# Patient Record
Sex: Female | Born: 1940
Health system: Southern US, Community
[De-identification: ages and names within clinical notes are randomized; demographics above are authoritative.]

## PROBLEM LIST (undated history)

## (undated) DIAGNOSIS — H269 Unspecified cataract: Secondary | ICD-10-CM

## (undated) DIAGNOSIS — I890 Lymphedema, not elsewhere classified: Secondary | ICD-10-CM

## (undated) DIAGNOSIS — J189 Pneumonia, unspecified organism: Secondary | ICD-10-CM

## (undated) DIAGNOSIS — C50919 Malignant neoplasm of unspecified site of unspecified female breast: Secondary | ICD-10-CM

## (undated) DIAGNOSIS — N189 Chronic kidney disease, unspecified: Secondary | ICD-10-CM

## (undated) DIAGNOSIS — Z8701 Personal history of pneumonia (recurrent): Secondary | ICD-10-CM

## (undated) DIAGNOSIS — E785 Hyperlipidemia, unspecified: Secondary | ICD-10-CM

## (undated) DIAGNOSIS — T8859XA Other complications of anesthesia, initial encounter: Secondary | ICD-10-CM

## (undated) DIAGNOSIS — T4145XA Adverse effect of unspecified anesthetic, initial encounter: Secondary | ICD-10-CM

## (undated) DIAGNOSIS — F419 Anxiety disorder, unspecified: Secondary | ICD-10-CM

## (undated) DIAGNOSIS — C229 Malignant neoplasm of liver, not specified as primary or secondary: Secondary | ICD-10-CM

## (undated) DIAGNOSIS — R011 Cardiac murmur, unspecified: Secondary | ICD-10-CM

## (undated) DIAGNOSIS — I5032 Chronic diastolic (congestive) heart failure: Secondary | ICD-10-CM

## (undated) DIAGNOSIS — K219 Gastro-esophageal reflux disease without esophagitis: Secondary | ICD-10-CM

## (undated) DIAGNOSIS — Z8042 Family history of malignant neoplasm of prostate: Secondary | ICD-10-CM

## (undated) DIAGNOSIS — F329 Major depressive disorder, single episode, unspecified: Secondary | ICD-10-CM

## (undated) DIAGNOSIS — M1990 Unspecified osteoarthritis, unspecified site: Secondary | ICD-10-CM

## (undated) DIAGNOSIS — I10 Essential (primary) hypertension: Secondary | ICD-10-CM

## (undated) DIAGNOSIS — N390 Urinary tract infection, site not specified: Secondary | ICD-10-CM

## (undated) DIAGNOSIS — Z803 Family history of malignant neoplasm of breast: Secondary | ICD-10-CM

## (undated) DIAGNOSIS — D649 Anemia, unspecified: Secondary | ICD-10-CM

## (undated) DIAGNOSIS — R55 Syncope and collapse: Secondary | ICD-10-CM

## (undated) DIAGNOSIS — K449 Diaphragmatic hernia without obstruction or gangrene: Secondary | ICD-10-CM

## (undated) DIAGNOSIS — F32A Depression, unspecified: Secondary | ICD-10-CM

## (undated) DIAGNOSIS — K2980 Duodenitis without bleeding: Secondary | ICD-10-CM

## (undated) DIAGNOSIS — G629 Polyneuropathy, unspecified: Secondary | ICD-10-CM

## (undated) HISTORY — PX: RECONSTRUCTION BREAST W/ TRAM FLAP: SUR1079

## (undated) HISTORY — DX: Malignant neoplasm of unspecified site of unspecified female breast: C50.919

## (undated) HISTORY — PX: EYE SURGERY: SHX253

## (undated) HISTORY — DX: Diaphragmatic hernia without obstruction or gangrene: K44.9

## (undated) HISTORY — DX: Essential (primary) hypertension: I10

## (undated) HISTORY — DX: Cardiac murmur, unspecified: R01.1

## (undated) HISTORY — PX: AXILLARY SURGERY: SHX892

## (undated) HISTORY — DX: Gastro-esophageal reflux disease without esophagitis: K21.9

## (undated) HISTORY — PX: TONSILLECTOMY: SUR1361

## (undated) HISTORY — DX: Personal history of pneumonia (recurrent): Z87.01

## (undated) HISTORY — PX: UPPER GI ENDOSCOPY: SHX6162

## (undated) HISTORY — DX: Urinary tract infection, site not specified: N39.0

## (undated) HISTORY — DX: Lymphedema, not elsewhere classified: I89.0

## (undated) HISTORY — DX: Unspecified cataract: H26.9

## (undated) HISTORY — DX: Hyperlipidemia, unspecified: E78.5

## (undated) HISTORY — DX: Unspecified osteoarthritis, unspecified site: M19.90

## (undated) HISTORY — DX: Duodenitis without bleeding: K29.80

## (undated) HISTORY — DX: Polyneuropathy, unspecified: G62.9

## (undated) HISTORY — DX: Syncope and collapse: R55

## (undated) HISTORY — DX: Anemia, unspecified: D64.9

---
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
## 1990-03-16 DIAGNOSIS — C50919 Malignant neoplasm of unspecified site of unspecified female breast: Secondary | ICD-10-CM

## 1990-03-16 HISTORY — PX: MASTECTOMY: SHX3

## 1990-03-16 HISTORY — DX: Malignant neoplasm of unspecified site of unspecified female breast: C50.919

## 1998-01-23 ENCOUNTER — Ambulatory Visit (HOSPITAL_COMMUNITY): Admission: RE | Admit: 1998-01-23 | Discharge: 1998-01-23 | Payer: Self-pay | Admitting: Family Medicine

## 2000-04-23 ENCOUNTER — Encounter: Admission: RE | Admit: 2000-04-23 | Discharge: 2000-04-23 | Payer: Self-pay | Admitting: Orthopedic Surgery

## 2000-04-23 ENCOUNTER — Encounter: Payer: Self-pay | Admitting: Orthopedic Surgery

## 2001-09-28 ENCOUNTER — Other Ambulatory Visit: Admission: RE | Admit: 2001-09-28 | Discharge: 2001-09-28 | Payer: Self-pay | Admitting: Obstetrics & Gynecology

## 2002-01-18 ENCOUNTER — Encounter: Payer: Self-pay | Admitting: Gastroenterology

## 2002-01-18 DIAGNOSIS — K2980 Duodenitis without bleeding: Secondary | ICD-10-CM

## 2002-01-18 HISTORY — DX: Duodenitis without bleeding: K29.80

## 2002-10-24 ENCOUNTER — Other Ambulatory Visit: Admission: RE | Admit: 2002-10-24 | Discharge: 2002-10-24 | Payer: Self-pay | Admitting: Obstetrics & Gynecology

## 2004-06-19 ENCOUNTER — Encounter: Admission: RE | Admit: 2004-06-19 | Discharge: 2004-06-19 | Payer: Self-pay | Admitting: Cardiovascular Disease

## 2004-09-09 ENCOUNTER — Encounter (INDEPENDENT_AMBULATORY_CARE_PROVIDER_SITE_OTHER): Payer: Self-pay | Admitting: *Deleted

## 2004-09-09 ENCOUNTER — Ambulatory Visit (HOSPITAL_COMMUNITY): Admission: RE | Admit: 2004-09-09 | Discharge: 2004-09-09 | Payer: Self-pay | Admitting: General Surgery

## 2004-09-09 ENCOUNTER — Ambulatory Visit (HOSPITAL_BASED_OUTPATIENT_CLINIC_OR_DEPARTMENT_OTHER): Admission: RE | Admit: 2004-09-09 | Discharge: 2004-09-09 | Payer: Self-pay | Admitting: General Surgery

## 2008-07-04 DIAGNOSIS — K219 Gastro-esophageal reflux disease without esophagitis: Secondary | ICD-10-CM | POA: Insufficient documentation

## 2008-07-04 DIAGNOSIS — K449 Diaphragmatic hernia without obstruction or gangrene: Secondary | ICD-10-CM | POA: Insufficient documentation

## 2008-07-04 DIAGNOSIS — K2980 Duodenitis without bleeding: Secondary | ICD-10-CM | POA: Insufficient documentation

## 2008-07-04 DIAGNOSIS — M129 Arthropathy, unspecified: Secondary | ICD-10-CM | POA: Insufficient documentation

## 2008-07-04 DIAGNOSIS — E7800 Pure hypercholesterolemia, unspecified: Secondary | ICD-10-CM | POA: Insufficient documentation

## 2008-07-04 DIAGNOSIS — I10 Essential (primary) hypertension: Secondary | ICD-10-CM | POA: Insufficient documentation

## 2008-07-04 DIAGNOSIS — C50919 Malignant neoplasm of unspecified site of unspecified female breast: Secondary | ICD-10-CM | POA: Insufficient documentation

## 2008-07-05 ENCOUNTER — Ambulatory Visit: Payer: Self-pay | Admitting: Gastroenterology

## 2008-07-05 DIAGNOSIS — E1129 Type 2 diabetes mellitus with other diabetic kidney complication: Secondary | ICD-10-CM | POA: Insufficient documentation

## 2008-07-05 DIAGNOSIS — R21 Rash and other nonspecific skin eruption: Secondary | ICD-10-CM | POA: Insufficient documentation

## 2008-07-05 LAB — CONVERTED CEMR LAB: Tissue Transglutaminase Ab, IgA: 0.1 units (ref <7)

## 2008-08-08 ENCOUNTER — Ambulatory Visit: Payer: Self-pay | Admitting: Gastroenterology

## 2008-08-08 LAB — CONVERTED CEMR LAB: UREASE: NEGATIVE

## 2008-11-29 ENCOUNTER — Telehealth: Payer: Self-pay | Admitting: Gastroenterology

## 2009-01-26 ENCOUNTER — Emergency Department (HOSPITAL_COMMUNITY): Admission: EM | Admit: 2009-01-26 | Discharge: 2009-01-26 | Payer: Self-pay | Admitting: Emergency Medicine

## 2009-01-26 ENCOUNTER — Emergency Department (HOSPITAL_COMMUNITY): Admission: EM | Admit: 2009-01-26 | Discharge: 2009-01-26 | Payer: Self-pay | Admitting: Family Medicine

## 2009-06-11 HISTORY — PX: NM MYOCAR PERF WALL MOTION: HXRAD629

## 2009-12-24 HISTORY — PX: TRANSTHORACIC ECHOCARDIOGRAM: SHX275

## 2010-04-01 ENCOUNTER — Telehealth: Payer: Self-pay | Admitting: Gastroenterology

## 2010-04-03 ENCOUNTER — Encounter: Payer: Self-pay | Admitting: Gastroenterology

## 2010-04-17 NOTE — Progress Notes (Signed)
Summary: Medication approval  Phone Note Call from Patient Call back at Home Phone [phone]   Caller: Patient Call For: Dr. Sharlett Iles Reason for Call: Talk to Nurse Summary of Call: Pt needs [place] to call and get her Lansoprazole approved with [employer] cross, she changed to the high option plan Initial call taken by: Martinique Johnson,  April 01, 2010 9:01 AM  Follow-up for Phone Call        i recieved a fax from the drug store this morning and I advised patient I will get the prior auth done as soon as I can, it will be no later than tomorrow afternoon Since Dr. Sharlett Iles is in clinic today. But I will call her when I hear back from the insurance company Follow-up by: Bernita Buffy CMA Deborra Medina),  April 01, 2010 12:02 PM     Appended Document: Medication approval medication approved, I have faxed sheet with information on it back to the pharm so they can rerun her rx. Left a message on patients machine to call back if questions other wise she can call the pharm

## 2010-04-17 NOTE — Medication Information (Signed)
Summary: Prevaid approved/BCBS  Prevaid approved/BCBS   Imported By: Phillis Knack 04/09/2010 09:14:03  _____________________________________________________________________  External Attachment:    Type:   Image     Comment:   External Document

## 2010-06-12 ENCOUNTER — Other Ambulatory Visit: Payer: Self-pay | Admitting: Gastroenterology

## 2010-06-12 MED ORDER — LANSOPRAZOLE 30 MG PO CPDR: 30.0000 mg | DELAYED_RELEASE_CAPSULE | Freq: Every day | ORAL | Status: DC

## 2010-06-12 NOTE — Telephone Encounter (Signed)
rx printed and mailed to patients home address. Left a message on patients machine to call back, if she has questions

## 2010-06-18 LAB — POCT I-STAT, CHEM 8
BUN: 13 mg/dL (ref 6–23)
Calcium, Ion: 1.15 mmol/L (ref 1.12–1.32)
Chloride: 102 mEq/L (ref 96–112)
Creatinine, Ser: 1.1 mg/dL (ref 0.4–1.2)
Glucose, Bld: 152 mg/dL — ABNORMAL HIGH (ref 70–99)
HCT: 41 % (ref 36.0–46.0)
Hemoglobin: 13.9 g/dL (ref 12.0–15.0)
Potassium: 3.9 mEq/L (ref 3.5–5.1)
Sodium: 139 mEq/L (ref 135–145)
TCO2: 26 mmol/L (ref 0–100)

## 2010-06-18 LAB — CBC
HCT: 37.1 % (ref 36.0–46.0)
Hemoglobin: 12.6 g/dL (ref 12.0–15.0)
MCHC: 33.9 g/dL (ref 30.0–36.0)
MCV: 88.6 fL (ref 78.0–100.0)
Platelets: 170 10*3/uL (ref 150–400)
RBC: 4.19 MIL/uL (ref 3.87–5.11)
RDW: 15 % (ref 11.5–15.5)
WBC: 10.5 10*3/uL (ref 4.0–10.5)

## 2010-06-18 LAB — DIFFERENTIAL
Basophils Absolute: 0 10*3/uL (ref 0.0–0.1)
Basophils Relative: 0 % (ref 0–1)
Eosinophils Absolute: 0.2 10*3/uL (ref 0.0–0.7)
Eosinophils Relative: 2 % (ref 0–5)
Lymphocytes Relative: 9 % — ABNORMAL LOW (ref 12–46)
Lymphs Abs: 0.9 10*3/uL (ref 0.7–4.0)
Monocytes Absolute: 0.4 10*3/uL (ref 0.1–1.0)
Monocytes Relative: 4 % (ref 3–12)
Neutro Abs: 8.9 10*3/uL — ABNORMAL HIGH (ref 1.7–7.7)
Neutrophils Relative %: 86 % — ABNORMAL HIGH (ref 43–77)

## 2010-06-24 LAB — GLUCOSE, CAPILLARY
Glucose-Capillary: 90 mg/dL (ref 70–99)
Glucose-Capillary: 99 mg/dL (ref 70–99)

## 2010-08-01 NOTE — Op Note (Signed)
NAMEALINNA, BURKHOLDER                 ACCOUNT NO.:  [id number]   MEDICAL RECORD NO.:  [id number]          PATIENT TYPE:  AMB   LOCATION:  [place]                          FACILITY:  [facility]   PHYSICIAN:  Rudell Cobb. Annamaria Boots, M.D.   DATE OF BIRTH:  [birth year]   DATE OF PROCEDURE:  09/09/2004  DATE OF DISCHARGE:                                 OPERATIVE REPORT   PREOPERATIVE DIAGNOSIS:  Sebaceous cyst of the right axilla.   POSTOPERATIVE DIAGNOSIS:  Sebaceous cyst of the right axilla.   OPERATION PERFORMED:  Excision of sebaceous cyst of the right axilla.   SURGEON:  Rudell Cobb. Annamaria Boots, M.D.   ANESTHESIA:  Local.   INDICATIONS FOR PROCEDURE:  Ms. Nuxoll has had a small sebaceous cyst for  some time in the right axilla but recently it had gotten slightly larger and  was causing some pain and discomfort and for that reason was scheduled for  excision.   DESCRIPTION OF PROCEDURE:  She was placed on the operating table and the  right axilla prepped and draped in the usual fashion. I anesthetized it with  1% Xylocaine with Adrenalin and then excised the sebaceous cyst.  It was a  small defect measuring 1.5 x 0.5 cm.  It was closed with three 4-0 nylon  sutures.  Dressing applied.  The patient was then allowed to go home.       PRY/MEDQ  D:  09/09/2004  T:  09/09/2004  Job:  [id number]

## 2011-03-24 DIAGNOSIS — I10 Essential (primary) hypertension: Secondary | ICD-10-CM | POA: Diagnosis not present

## 2011-03-24 DIAGNOSIS — E785 Hyperlipidemia, unspecified: Secondary | ICD-10-CM | POA: Diagnosis not present

## 2011-03-24 DIAGNOSIS — M899 Disorder of bone, unspecified: Secondary | ICD-10-CM | POA: Diagnosis not present

## 2011-03-24 DIAGNOSIS — E119 Type 2 diabetes mellitus without complications: Secondary | ICD-10-CM | POA: Diagnosis not present

## 2011-03-24 DIAGNOSIS — M949 Disorder of cartilage, unspecified: Secondary | ICD-10-CM | POA: Diagnosis not present

## 2011-03-30 DIAGNOSIS — M899 Disorder of bone, unspecified: Secondary | ICD-10-CM | POA: Diagnosis not present

## 2011-03-30 DIAGNOSIS — I10 Essential (primary) hypertension: Secondary | ICD-10-CM | POA: Diagnosis not present

## 2011-03-30 DIAGNOSIS — E559 Vitamin D deficiency, unspecified: Secondary | ICD-10-CM | POA: Diagnosis not present

## 2011-03-30 DIAGNOSIS — E785 Hyperlipidemia, unspecified: Secondary | ICD-10-CM | POA: Diagnosis not present

## 2011-03-30 DIAGNOSIS — E119 Type 2 diabetes mellitus without complications: Secondary | ICD-10-CM | POA: Diagnosis not present

## 2011-03-30 DIAGNOSIS — M949 Disorder of cartilage, unspecified: Secondary | ICD-10-CM | POA: Diagnosis not present

## 2011-04-07 DIAGNOSIS — I10 Essential (primary) hypertension: Secondary | ICD-10-CM | POA: Diagnosis not present

## 2011-04-07 DIAGNOSIS — I701 Atherosclerosis of renal artery: Secondary | ICD-10-CM | POA: Diagnosis not present

## 2011-06-10 DIAGNOSIS — I10 Essential (primary) hypertension: Secondary | ICD-10-CM | POA: Diagnosis not present

## 2011-06-10 DIAGNOSIS — I739 Peripheral vascular disease, unspecified: Secondary | ICD-10-CM | POA: Diagnosis not present

## 2011-06-10 DIAGNOSIS — E782 Mixed hyperlipidemia: Secondary | ICD-10-CM | POA: Diagnosis not present

## 2011-06-15 ENCOUNTER — Ambulatory Visit (INDEPENDENT_AMBULATORY_CARE_PROVIDER_SITE_OTHER): Payer: Medicare Other | Admitting: Internal Medicine

## 2011-06-15 ENCOUNTER — Encounter: Payer: Self-pay | Admitting: Internal Medicine

## 2011-06-15 VITALS — BP 140/70 | HR 70 | Temp 99.0°F | Resp 20 | Ht 65.0 in | Wt 172.0 lb

## 2011-06-15 DIAGNOSIS — M129 Arthropathy, unspecified: Secondary | ICD-10-CM

## 2011-06-15 DIAGNOSIS — Z0000 Encounter for general adult medical examination without abnormal findings: Secondary | ICD-10-CM

## 2011-06-15 DIAGNOSIS — I10 Essential (primary) hypertension: Secondary | ICD-10-CM | POA: Diagnosis not present

## 2011-06-15 DIAGNOSIS — E119 Type 2 diabetes mellitus without complications: Secondary | ICD-10-CM | POA: Diagnosis not present

## 2011-06-15 DIAGNOSIS — E7800 Pure hypercholesterolemia, unspecified: Secondary | ICD-10-CM

## 2011-06-15 NOTE — Patient Instructions (Signed)
Please check your hemoglobin A1c every 3 months  Limit your sodium (Salt) intake    It is important that you exercise regularly, at least 20 minutes 3 to 4 times per week.  If you develop chest pain or shortness of breath seek  medical attention.  Take a calcium supplement, plus [phone] units of vitamin D  Return in one year for follow-up

## 2011-06-15 NOTE — Progress Notes (Signed)
Subjective:    Patient ID: Donna May, female    DOB: [birth year], 71 y.o.   MRN: [id number]  HPI   71 year old patient who is seen today to establish with our practice medical problems include diabetes hypertension and dyslipidemia. She has remote history of right breast cancer. Social consultants include cardiology Rollene Fare), GYN, endocrinology (Alzheimer) and ophthalmology Gershon Crane) She is doing quite well only complaint is intermittent shoulder and knee pain. She feels as she has had a colonoscopy about 4 or 5 years ago. She did have a mammogram last year and her next eye exam is due in June of this year.  Family history father died at 65 had a history of arthritis and senile dementia Mother died at age 56 her first MI at 47 status post CABG. She also had MS 2 brothers both with coronary artery disease one brother deceased also a history of diabetes.  Surgical history status post mastectomy in 1992 has had a rectovaginal fistula repaired in 1970 remote tonsillectomy in 1958  Retired from the [employer] school system   1. Risk factors, based on past  M,S,F history- cardiovascular risk factors include diabetes hypertension and dyslipidemia  2.  Physical activities: No exercise limitations but does have some bilateral knee pain 3.  Depression/mood: No history depression in her meds or her 4.  Hearing: No significant deficits  5.  ADL's: Independent in all aspects of daily living  6.  Fall risk: Low  7.  Home safety: No problems identified  8.  Height weight, and visual acuity;  9.  Counseling: Regular exercise restricted salt and calorie diet encouraged  10. Lab orders based on risk factors:  11. Referral : Followup with GYN cardiology endocrinology  12. Care plan: No change in regimen more exercise modest weight loss all encouraged 13. Cognitive assessment: Alert and oriented with normal affect no cognitive dysfunction    Review of Systems  Constitutional:  Negative.   HENT: Negative for hearing loss, congestion, sore throat, rhinorrhea, dental problem, sinus pressure and tinnitus.   Eyes: Negative for pain, discharge and visual disturbance.  Respiratory: Negative for cough and shortness of breath.   Cardiovascular: Negative for chest pain, palpitations and leg swelling.  Gastrointestinal: Negative for nausea, vomiting, abdominal pain, diarrhea, constipation, blood in stool and abdominal distention.  Genitourinary: Negative for dysuria, urgency, frequency, hematuria, flank pain, vaginal bleeding, vaginal discharge, difficulty urinating, vaginal pain and pelvic pain.  Musculoskeletal: Positive for arthralgias (knee and shoulder discomfort). Negative for joint swelling and gait problem.  Skin: Negative for rash.  Neurological: Negative for dizziness, syncope, speech difficulty, weakness, numbness and headaches.  Hematological: Negative for adenopathy.  Psychiatric/Behavioral: Negative for behavioral problems, dysphoric mood and agitation. The patient is not nervous/anxious.        Objective:   Physical Exam  Constitutional: She is oriented to person, place, and time. She appears well-developed and well-nourished.  HENT:  Head: Normocephalic.  Right Ear: External ear normal.  Left Ear: External ear normal.  Mouth/Throat: Oropharynx is clear and moist.  Eyes: Conjunctivae and EOM are normal. Pupils are equal, round, and reactive to light.  Neck: Normal range of motion. Neck supple. No thyromegaly present.  Cardiovascular: Normal rate, regular rhythm and intact distal pulses.   Murmur heard.      Grade 1- 2/6 systolic ejection murmur  Dorsalis pedis pulses are full posterior tibial pulses not easily palpable  Pulmonary/Chest: Effort normal and breath sounds normal.  Abdominal: Soft. Bowel sounds are normal. She  exhibits no mass. There is no tenderness.  Musculoskeletal: Normal range of motion.  Lymphadenopathy:    She has no cervical  adenopathy.  Neurological: She is alert and oriented to person, place, and time.       Intact to vibratory sensation and monofilament testing  Skin: Skin is warm and dry. No rash noted.  Psychiatric: She has a normal mood and affect. Her behavior is normal.          Assessment & Plan:   Preventive health examination Diabetes Hypertension Dyslipidemia  In view of her multiple consultants will reassess in one year

## 2011-06-16 ENCOUNTER — Encounter: Payer: Self-pay | Admitting: Internal Medicine

## 2011-07-20 DIAGNOSIS — E559 Vitamin D deficiency, unspecified: Secondary | ICD-10-CM | POA: Diagnosis not present

## 2011-07-20 DIAGNOSIS — E785 Hyperlipidemia, unspecified: Secondary | ICD-10-CM | POA: Diagnosis not present

## 2011-07-20 DIAGNOSIS — E119 Type 2 diabetes mellitus without complications: Secondary | ICD-10-CM | POA: Diagnosis not present

## 2011-07-22 DIAGNOSIS — I10 Essential (primary) hypertension: Secondary | ICD-10-CM | POA: Diagnosis not present

## 2011-07-22 DIAGNOSIS — E559 Vitamin D deficiency, unspecified: Secondary | ICD-10-CM | POA: Diagnosis not present

## 2011-07-22 DIAGNOSIS — E119 Type 2 diabetes mellitus without complications: Secondary | ICD-10-CM | POA: Diagnosis not present

## 2011-07-22 DIAGNOSIS — M899 Disorder of bone, unspecified: Secondary | ICD-10-CM | POA: Diagnosis not present

## 2011-07-22 DIAGNOSIS — E785 Hyperlipidemia, unspecified: Secondary | ICD-10-CM | POA: Diagnosis not present

## 2011-07-22 DIAGNOSIS — M949 Disorder of cartilage, unspecified: Secondary | ICD-10-CM | POA: Diagnosis not present

## 2011-09-03 ENCOUNTER — Telehealth: Payer: Self-pay | Admitting: Gastroenterology

## 2011-09-04 ENCOUNTER — Other Ambulatory Visit: Payer: Self-pay | Admitting: Gastroenterology

## 2011-09-04 MED ORDER — LANSOPRAZOLE 30 MG PO CPDR: 30.0000 mg | DELAYED_RELEASE_CAPSULE | Freq: Every day | ORAL | Status: DC

## 2011-09-04 NOTE — Telephone Encounter (Signed)
Pt made appointment so Rx sent to [facility] as she requested.

## 2011-09-04 NOTE — Telephone Encounter (Signed)
Pt requesting Prevacid refill, LM that she needs appointment to see Dr. Sharlett Iles before any more refills, last seen 07/05/2008.

## 2011-09-25 ENCOUNTER — Other Ambulatory Visit (INDEPENDENT_AMBULATORY_CARE_PROVIDER_SITE_OTHER): Payer: Medicare Other

## 2011-09-25 ENCOUNTER — Ambulatory Visit (INDEPENDENT_AMBULATORY_CARE_PROVIDER_SITE_OTHER): Payer: Medicare Other | Admitting: Gastroenterology

## 2011-09-25 ENCOUNTER — Encounter: Payer: Self-pay | Admitting: Gastroenterology

## 2011-09-25 VITALS — BP 120/60 | HR 60 | Ht 65.5 in | Wt 162.1 lb

## 2011-09-25 DIAGNOSIS — R7989 Other specified abnormal findings of blood chemistry: Secondary | ICD-10-CM | POA: Diagnosis not present

## 2011-09-25 DIAGNOSIS — K219 Gastro-esophageal reflux disease without esophagitis: Secondary | ICD-10-CM | POA: Diagnosis not present

## 2011-09-25 DIAGNOSIS — R6889 Other general symptoms and signs: Secondary | ICD-10-CM

## 2011-09-25 DIAGNOSIS — E539 Vitamin B deficiency, unspecified: Secondary | ICD-10-CM | POA: Diagnosis not present

## 2011-09-25 DIAGNOSIS — K5900 Constipation, unspecified: Secondary | ICD-10-CM

## 2011-09-25 LAB — FOLATE: Folate: 22.8 ng/mL (ref >5.9)

## 2011-09-25 LAB — VITAMIN B12: Vitamin B-12: 1199 pg/mL — ABNORMAL HIGH (ref 211–911)

## 2011-09-25 LAB — FERRITIN: Ferritin: 33.6 ng/mL (ref 10.0–291.0)

## 2011-09-25 LAB — IBC PANEL: Iron: 68 ug/dL (ref 42–145)

## 2011-09-25 MED ORDER — LANSOPRAZOLE 30 MG PO CPDR: DELAYED_RELEASE_CAPSULE | ORAL | Status: DC

## 2011-09-25 NOTE — Patient Instructions (Addendum)
Your physician has requested that you go to the basement for the following lab work before leaving today:Ifob, anemia panel. Start taking over the counter fiber supplement Metamucil once daily 30 minutes after breakfast. We have sent the following medications to your mail order pharmacy: Prevacid for you to take 30 minutes before breakfast.   High Fiber Diet A high fiber diet changes your normal diet to include more whole grains, legumes, fruits, and vegetables. Changes in the diet involve replacing refined carbohydrates with unrefined foods. The calorie level of the diet is essentially unchanged. The Dietary Reference Intake (recommended amount) for adult males is 38 g per day. For adult females, it is 25 g per day. Pregnant and lactating women should consume 28 g of fiber per day. Fiber is the intact part of a plant that is not broken down during digestion. Functional fiber is fiber that has been isolated from the plant to provide a beneficial effect in the body. PURPOSE  Increase stool bulk.   Ease and regulate bowel movements.   Lower cholesterol.  INDICATIONS THAT YOU NEED MORE FIBER  Constipation and hemorrhoids.   Uncomplicated diverticulosis (intestine condition) and irritable bowel syndrome.   Weight management.   As a protective measure against hardening of the arteries (atherosclerosis), diabetes, and cancer.  NOTE OF CAUTION If you have a digestive or bowel problem, ask your caregiver for advice before adding high fiber foods to your diet. Some of the following medical problems are such that a high fiber diet should not be used without consulting your caregiver:  Acute diverticulitis (intestine infection).   Partial small bowel obstructions.   Complicated diverticular disease involving bleeding, rupture (perforation), or abscess (boil, furuncle).   Presence of autonomic neuropathy (nerve damage) or gastric paresis (stomach cannot empty itself).  GUIDELINES FOR INCREASING  FIBER  Start adding fiber to the diet slowly. A gradual increase of about 5 more grams (2 slices of whole-wheat bread, 2 servings of most fruits or vegetables, or 1 bowl of high fiber cereal) per day is best. Too rapid an increase in fiber may result in constipation, flatulence, and bloating.   Drink enough water and fluids to keep your urine clear or pale yellow. Water, juice, or caffeine-free drinks are recommended. Not drinking enough fluid may cause constipation.   Eat a variety of high fiber foods rather than one type of fiber.   Try to increase your intake of fiber through using high fiber foods rather than fiber pills or supplements that contain small amounts of fiber.   The goal is to change the types of food eaten. Do not supplement your present diet with high fiber foods, but replace foods in your present diet.  INCLUDE A VARIETY OF FIBER SOURCES  Replace refined and processed grains with whole grains, canned fruits with fresh fruits, and incorporate other fiber sources. White rice, white breads, and most bakery goods contain little or no fiber.   Brown whole-grain rice, buckwheat oats, and many fruits and vegetables are all good sources of fiber. These include: broccoli, Brussels sprouts, cabbage, cauliflower, beets, sweet potatoes, white potatoes (skin on), carrots, tomatoes, eggplant, squash, berries, fresh fruits, and dried fruits.   Cereals appear to be the richest source of fiber. Cereal fiber is found in whole grains and bran. Bran is the fiber-rich outer coat of cereal grain, which is largely removed in refining. In whole-grain cereals, the bran remains. In breakfast cereals, the largest amount of fiber is found in those with "bran" in  their names. The fiber content is sometimes indicated on the label.   You may need to include additional fruits and vegetables each day.   In baking, for 1 cup white flour, you may use the following substitutions:   1 cup whole-wheat flour  minus 2 tbs.    cup white flour plus  cup whole-wheat flour.  Document Released: 03/02/2005 Document Revised: 02/19/2011 Document Reviewed: 01/08/2009 [facility] Patient Information 2012 [place], [place].   cc: Bluford Kaufmann, MD

## 2011-09-25 NOTE — Progress Notes (Signed)
This is a 71 year old [place] American female with a history of previous mastectomy for breast cancer. She had colonoscopy performed 3 years ago which was unremarkable. She continues mild gas, bloating, and mild constipation without rectal bleeding, melena, or evidence of anemia. Her main gastrointestinal problem is chronic GERD managed with Prevacid 30 mg a day. She currently denies dysphagia, hepatobiliary or systemic complaints. She apparently is on B12 oral supplementation. Her diabetes in general medical care is provided by Dr. Mick Sell.  Current Medications, Allergies, Past Medical History, Past Surgical History, Family History and Social History were reviewed in [employer] record.  Pertinent Review of Systems Negative   Physical Exam: Healthy patient appearing her stated age. Blood pressure 120/60, pulse 60 and regular, and weight 162 pounds with a BMI of 26.57. I cannot appreciate stigmata of chronic liver disease. Chest is clear, and she has a regular rhythm without significant murmur gallops or rubs. There is no organomegaly, abdominal masses, tenderness, and bowel sounds are normal. Mental status is normal, and peripheral extremities are unremarkable.    Assessment and Plan: I have reviewed Donna May's medications, and she has taken her Prevacid after meals. I advised her to take Prevacid 30 minutes to an hour before first meal of the day for maximum drug affect. Also she is to get on a high fiber diet with daily Metamucil and liberal by mouth fluids. Serum B12 level ordered and IFOB stool cards ordered. She gets her other blood work done yearly with her endocrinologist. Standard and diarrhea flex regime also reviewed today. She does not need followup colonoscopy at this time. Encounter Diagnoses  Name Primary?  . Esophageal reflux Yes  . Vitamin B deficiency   . Other general symptoms

## 2011-10-12 ENCOUNTER — Other Ambulatory Visit: Payer: Medicare Other

## 2011-10-12 DIAGNOSIS — E539 Vitamin B deficiency, unspecified: Secondary | ICD-10-CM

## 2011-10-12 DIAGNOSIS — K219 Gastro-esophageal reflux disease without esophagitis: Secondary | ICD-10-CM

## 2011-10-15 DIAGNOSIS — Z1231 Encounter for screening mammogram for malignant neoplasm of breast: Secondary | ICD-10-CM | POA: Diagnosis not present

## 2011-10-15 LAB — HM MAMMOGRAPHY: HM Mammogram: NEGATIVE

## 2011-10-16 ENCOUNTER — Encounter: Payer: Self-pay | Admitting: Internal Medicine

## 2011-10-30 DIAGNOSIS — Z1151 Encounter for screening for human papillomavirus (HPV): Secondary | ICD-10-CM | POA: Diagnosis not present

## 2011-10-30 DIAGNOSIS — Z124 Encounter for screening for malignant neoplasm of cervix: Secondary | ICD-10-CM | POA: Diagnosis not present

## 2011-10-30 DIAGNOSIS — Z01419 Encounter for gynecological examination (general) (routine) without abnormal findings: Secondary | ICD-10-CM | POA: Diagnosis not present

## 2011-11-02 ENCOUNTER — Encounter: Payer: Self-pay | Admitting: Internal Medicine

## 2011-11-02 DIAGNOSIS — E119 Type 2 diabetes mellitus without complications: Secondary | ICD-10-CM | POA: Diagnosis not present

## 2011-11-02 DIAGNOSIS — H2510 Age-related nuclear cataract, unspecified eye: Secondary | ICD-10-CM | POA: Diagnosis not present

## 2011-11-02 LAB — HM DIABETES EYE EXAM

## 2011-11-19 DIAGNOSIS — E785 Hyperlipidemia, unspecified: Secondary | ICD-10-CM | POA: Diagnosis not present

## 2011-11-19 DIAGNOSIS — E119 Type 2 diabetes mellitus without complications: Secondary | ICD-10-CM | POA: Diagnosis not present

## 2011-11-23 DIAGNOSIS — I10 Essential (primary) hypertension: Secondary | ICD-10-CM | POA: Diagnosis not present

## 2011-11-23 DIAGNOSIS — M899 Disorder of bone, unspecified: Secondary | ICD-10-CM | POA: Diagnosis not present

## 2011-11-23 DIAGNOSIS — E785 Hyperlipidemia, unspecified: Secondary | ICD-10-CM | POA: Diagnosis not present

## 2011-11-23 DIAGNOSIS — Z23 Encounter for immunization: Secondary | ICD-10-CM | POA: Diagnosis not present

## 2011-11-23 DIAGNOSIS — E559 Vitamin D deficiency, unspecified: Secondary | ICD-10-CM | POA: Diagnosis not present

## 2011-11-23 DIAGNOSIS — E119 Type 2 diabetes mellitus without complications: Secondary | ICD-10-CM | POA: Diagnosis not present

## 2011-11-26 DIAGNOSIS — I10 Essential (primary) hypertension: Secondary | ICD-10-CM | POA: Diagnosis not present

## 2011-11-26 DIAGNOSIS — E119 Type 2 diabetes mellitus without complications: Secondary | ICD-10-CM | POA: Diagnosis not present

## 2012-03-28 DIAGNOSIS — E785 Hyperlipidemia, unspecified: Secondary | ICD-10-CM | POA: Diagnosis not present

## 2012-03-28 DIAGNOSIS — E119 Type 2 diabetes mellitus without complications: Secondary | ICD-10-CM | POA: Diagnosis not present

## 2012-03-28 DIAGNOSIS — E559 Vitamin D deficiency, unspecified: Secondary | ICD-10-CM | POA: Diagnosis not present

## 2012-03-30 DIAGNOSIS — I10 Essential (primary) hypertension: Secondary | ICD-10-CM | POA: Diagnosis not present

## 2012-03-30 DIAGNOSIS — E559 Vitamin D deficiency, unspecified: Secondary | ICD-10-CM | POA: Diagnosis not present

## 2012-03-30 DIAGNOSIS — E119 Type 2 diabetes mellitus without complications: Secondary | ICD-10-CM | POA: Diagnosis not present

## 2012-03-30 DIAGNOSIS — E785 Hyperlipidemia, unspecified: Secondary | ICD-10-CM | POA: Diagnosis not present

## 2012-03-30 DIAGNOSIS — M949 Disorder of cartilage, unspecified: Secondary | ICD-10-CM | POA: Diagnosis not present

## 2012-05-02 ENCOUNTER — Ambulatory Visit (INDEPENDENT_AMBULATORY_CARE_PROVIDER_SITE_OTHER): Payer: Medicare Other | Admitting: Internal Medicine

## 2012-05-02 ENCOUNTER — Encounter: Payer: Self-pay | Admitting: Internal Medicine

## 2012-05-02 VITALS — BP 160/90 | HR 72 | Temp 98.5°F | Resp 18 | Wt 175.0 lb

## 2012-05-02 DIAGNOSIS — C50919 Malignant neoplasm of unspecified site of unspecified female breast: Secondary | ICD-10-CM

## 2012-05-02 DIAGNOSIS — E7800 Pure hypercholesterolemia, unspecified: Secondary | ICD-10-CM | POA: Diagnosis not present

## 2012-05-02 DIAGNOSIS — E119 Type 2 diabetes mellitus without complications: Secondary | ICD-10-CM | POA: Diagnosis not present

## 2012-05-02 DIAGNOSIS — Z23 Encounter for immunization: Secondary | ICD-10-CM | POA: Diagnosis not present

## 2012-05-02 DIAGNOSIS — I10 Essential (primary) hypertension: Secondary | ICD-10-CM

## 2012-05-02 DIAGNOSIS — M129 Arthropathy, unspecified: Secondary | ICD-10-CM

## 2012-05-02 NOTE — Patient Instructions (Signed)
Please check your hemoglobin A1c every 3 months    It is important that you exercise regularly, at least 20 minutes 3 to 4 times per week.  If you develop chest pain or shortness of breath seek  medical attention.  Return in one year for follow-up

## 2012-05-02 NOTE — Progress Notes (Signed)
Subjective:    Patient ID: Donna May, female    DOB: 1940-10-02, 72 y.o.   MRN: [id number]  HPI  72 year old patient who is seen here today basically for [facility] form completion. She is followed by endocrinology her last hemoglobin A1c 6.3 last month she has treated hypertension and dyslipidemia. She does have arthritis in her only complaint today is right shoulder pain. It is unclear why she requires the [facility] evaluation. Denies any history of mental or emotional problems substance abuse alcohol use. She does not take any anxiolytic medications. She has been compliant with her medications. She does have a history of cataracts and uses glasses and did see Dr. Gershon Crane in the fall.  Past Medical History  Diagnosis Date  . Arthritis   . Breast cancer 1992  . Diabetes mellitus   . Heart murmur   . Hypertension   . Hyperlipidemia   . Fainting spell   . UTI (lower urinary tract infection)   . Duodenitis 01/18/2002  . GERD (gastroesophageal reflux disease)   . Hiatal hernia 08/08/2008, 01/18/2002  . History of pneumonia     History   Social History  . Marital Status: Single    Spouse Name: N/A    Number of Children: 2  . Years of Education: N/A   Occupational History  . retired    Social History Main Topics  . Smoking status: Former Research scientist (life sciences)  . Smokeless tobacco: Never Used  . Alcohol Use: Yes     Comment: ocass  . Drug Use: No  . Sexually Active: Not on file   Other Topics Concern  . Not on file   Social History Narrative  . No narrative on file    Past Surgical History  Procedure Laterality Date  . Mastectomy  1992    right, with flap  . Tonsillectomy    . Axillary surgery      cyst removal, right    Family History  Problem Relation Age of Onset  . Breast cancer Cousin   . Diabetes Brother   . Heart disease Mother   . Heart disease Father   . Prostate cancer Brother     No Known Allergies  Current Outpatient Prescriptions on File Prior to Visit  Medication  Sig Dispense Refill  . amLODipine (NORVASC) 10 MG tablet Take 10 mg by mouth daily.       Marland Kitchen aspirin 81 MG tablet Take 81 mg by mouth daily.      Marland Kitchen CALCIUM-MAGNESIUM-VITAMIN D PO Take 1 tablet by mouth daily.      . Cholecalciferol (VITAMIN D3) 2000 UNITS TABS Take 1 tablet by mouth daily.      . CRESTOR 20 MG tablet Take 20 mg by mouth daily.       . Echinacea 400 MG CAPS Take 1 capsule by mouth daily.      Marland Kitchen EVISTA 60 MG tablet Take 60 mg by mouth daily.       . Glucosamine-Chondroit-Vit C-Mn (GLUCOSAMINE CHONDR 1500 COMPLX PO) Take 1 tablet by mouth daily.      Marland Kitchen JANUVIA 100 MG tablet Take 100 mg by mouth daily.       . lansoprazole (PREVACID) 30 MG capsule Take one tablet by mouth once daily 30 minutes before breakfast.  90 capsule  3  . metFORMIN (GLUCOPHAGE-XR) 500 MG 24 hr tablet Take 500 mg by mouth 2 (two) times daily before a meal.       . metoprolol succinate (TOPROL-XL) 50 MG  24 hr tablet Take 75 mg by mouth daily.       . Multiple Vitamins-Minerals (CENTRUM SILVER ULTRA WOMENS) TABS Take 1 tablet by mouth daily.      . valsartan-hydrochlorothiazide (DIOVAN-HCT) 160-12.5 MG per tablet Take 1 tablet by mouth daily.        No current facility-administered medications on file prior to visit.    BP 160/90  Pulse 72  Temp(Src) 98.5 F (36.9 C) (Oral)  Resp 18  Wt 175 lb (79.379 kg)  BMI 28.67 kg/m2  SpO2 97%       Review of Systems  Constitutional: Negative.   HENT: Negative for hearing loss, congestion, sore throat, rhinorrhea, dental problem, sinus pressure and tinnitus.   Eyes: Negative for pain, discharge and visual disturbance.  Respiratory: Negative for cough and shortness of breath.   Cardiovascular: Negative for chest pain, palpitations and leg swelling.  Gastrointestinal: Negative for nausea, vomiting, abdominal pain, diarrhea, constipation, blood in stool and abdominal distention.  Genitourinary: Negative for dysuria, urgency, frequency, hematuria, flank pain,  vaginal bleeding, vaginal discharge, difficulty urinating, vaginal pain and pelvic pain.  Musculoskeletal: Positive for arthralgias. Negative for joint swelling and gait problem.       Right shoulder pain  Skin: Negative for rash.  Neurological: Negative for dizziness, syncope, speech difficulty, weakness, numbness and headaches.  Hematological: Negative for adenopathy.  Psychiatric/Behavioral: Negative for behavioral problems, dysphoric mood and agitation. The patient is not nervous/anxious.        Objective:   Physical Exam  Constitutional: She is oriented to person, place, and time. She appears well-developed and well-nourished.  Blood pressure repeat 130/84  HENT:  Head: Normocephalic.  Right Ear: External ear normal.  Left Ear: External ear normal.  Mouth/Throat: Oropharynx is clear and moist.  Eyes: Conjunctivae and EOM are normal. Pupils are equal, round, and reactive to light.  Wears glasses  Neck: Normal range of motion. Neck supple. No thyromegaly present.  Cardiovascular: Normal rate, regular rhythm, normal heart sounds and intact distal pulses.   Pulmonary/Chest: Effort normal and breath sounds normal.  Abdominal: Soft. Bowel sounds are normal. She exhibits no mass. There is no tenderness.  Musculoskeletal: Normal range of motion.  Lymphadenopathy:    She has no cervical adenopathy.  Neurological: She is alert and oriented to person, place, and time.  Skin: Skin is warm and dry. No rash noted.  Psychiatric: She has a normal mood and affect. Her behavior is normal.          Assessment & Plan:   Diabetes mellitus. Well controlled Hypertension. Dyslipidemia. Continue Crestor   complete examination as scheduled DMV forms completed Disability placard completed

## 2012-05-19 DIAGNOSIS — E119 Type 2 diabetes mellitus without complications: Secondary | ICD-10-CM | POA: Diagnosis not present

## 2012-05-19 DIAGNOSIS — E782 Mixed hyperlipidemia: Secondary | ICD-10-CM | POA: Diagnosis not present

## 2012-05-19 DIAGNOSIS — I10 Essential (primary) hypertension: Secondary | ICD-10-CM | POA: Diagnosis not present

## 2012-06-03 DIAGNOSIS — I10 Essential (primary) hypertension: Secondary | ICD-10-CM | POA: Diagnosis not present

## 2012-08-02 DIAGNOSIS — E785 Hyperlipidemia, unspecified: Secondary | ICD-10-CM | POA: Diagnosis not present

## 2012-08-02 DIAGNOSIS — E559 Vitamin D deficiency, unspecified: Secondary | ICD-10-CM | POA: Diagnosis not present

## 2012-08-02 DIAGNOSIS — E119 Type 2 diabetes mellitus without complications: Secondary | ICD-10-CM | POA: Diagnosis not present

## 2012-08-05 DIAGNOSIS — M899 Disorder of bone, unspecified: Secondary | ICD-10-CM | POA: Diagnosis not present

## 2012-08-05 DIAGNOSIS — I10 Essential (primary) hypertension: Secondary | ICD-10-CM | POA: Diagnosis not present

## 2012-08-05 DIAGNOSIS — E119 Type 2 diabetes mellitus without complications: Secondary | ICD-10-CM | POA: Diagnosis not present

## 2012-08-05 DIAGNOSIS — E785 Hyperlipidemia, unspecified: Secondary | ICD-10-CM | POA: Diagnosis not present

## 2012-08-05 DIAGNOSIS — E559 Vitamin D deficiency, unspecified: Secondary | ICD-10-CM | POA: Diagnosis not present

## 2012-08-05 DIAGNOSIS — M949 Disorder of cartilage, unspecified: Secondary | ICD-10-CM | POA: Diagnosis not present

## 2012-10-12 ENCOUNTER — Telehealth (HOSPITAL_COMMUNITY): Payer: Self-pay | Admitting: Cardiovascular Disease

## 2012-10-12 ENCOUNTER — Other Ambulatory Visit: Payer: Self-pay | Admitting: *Deleted

## 2012-10-12 DIAGNOSIS — I701 Atherosclerosis of renal artery: Secondary | ICD-10-CM

## 2012-10-17 DIAGNOSIS — Z1231 Encounter for screening mammogram for malignant neoplasm of breast: Secondary | ICD-10-CM | POA: Diagnosis not present

## 2012-11-01 ENCOUNTER — Ambulatory Visit (HOSPITAL_COMMUNITY)
Admission: RE | Admit: 2012-11-01 | Discharge: 2012-11-01 | Disposition: A | Discharge status: Home / Self Care | Payer: Medicare Other | Source: Ambulatory Visit | Attending: Cardiovascular Disease | Admitting: Cardiovascular Disease

## 2012-11-01 DIAGNOSIS — I701 Atherosclerosis of renal artery: Secondary | ICD-10-CM | POA: Diagnosis not present

## 2012-11-01 NOTE — Progress Notes (Signed)
Renal Artery Duplex Completed. °Brianna L Mazza,RVT °

## 2012-11-02 DIAGNOSIS — Z01419 Encounter for gynecological examination (general) (routine) without abnormal findings: Secondary | ICD-10-CM | POA: Diagnosis not present

## 2012-11-02 DIAGNOSIS — Z124 Encounter for screening for malignant neoplasm of cervix: Secondary | ICD-10-CM | POA: Diagnosis not present

## 2012-11-02 DIAGNOSIS — Z9189 Other specified personal risk factors, not elsewhere classified: Secondary | ICD-10-CM | POA: Diagnosis not present

## 2012-11-04 ENCOUNTER — Telehealth: Payer: Self-pay | Admitting: Cardiovascular Disease

## 2012-11-04 NOTE — Telephone Encounter (Signed)
Returned call.  Left message to call back before 4pm or Monday between 8am and 4pm.  Refills sent on 8.20.14 were for Crestor and Valsartan-HCTZ.

## 2012-11-04 NOTE — Telephone Encounter (Signed)
Patient saw [facility] this week.  She has rx for metoprolol with refills.  She states pharmacy called to let her know new rx had been called in.  She is confused about new rx and states she was not told anything about dosage change. [facility]

## 2012-11-04 NOTE — Telephone Encounter (Signed)
Patient saw dr. Rollene Fare this week.

## 2012-11-07 ENCOUNTER — Telehealth: Payer: Self-pay | Admitting: Cardiovascular Disease

## 2012-11-07 NOTE — Telephone Encounter (Signed)
Returning call about her prescription .Marland Kitchen Please call

## 2012-11-07 NOTE — Telephone Encounter (Signed)
Returned call.  Left message to call back before 4pm.  Fax received from [place] asking to verify contact info on Rxs from 8.20.14.  Information written and faxed back.

## 2012-11-11 ENCOUNTER — Telehealth: Payer: Self-pay | Admitting: Gastroenterology

## 2012-11-11 DIAGNOSIS — K219 Gastro-esophageal reflux disease without esophagitis: Secondary | ICD-10-CM

## 2012-11-11 DIAGNOSIS — E539 Vitamin B deficiency, unspecified: Secondary | ICD-10-CM

## 2012-11-11 MED ORDER — LANSOPRAZOLE 30 MG PO CPDR: DELAYED_RELEASE_CAPSULE | ORAL | Status: DC

## 2012-11-11 NOTE — Telephone Encounter (Signed)
I called [facility] and spoke with Manus Gunning Per Manus Gunning

## 2012-11-11 NOTE — Telephone Encounter (Signed)
Per Manus Gunning patient just needed a new prescription sent in, no prior auth needed  I called patient and advised her what Manus Gunning at [employer] said. Patient verbalized understanding

## 2012-11-24 DIAGNOSIS — H2510 Age-related nuclear cataract, unspecified eye: Secondary | ICD-10-CM | POA: Diagnosis not present

## 2012-11-24 DIAGNOSIS — E119 Type 2 diabetes mellitus without complications: Secondary | ICD-10-CM | POA: Diagnosis not present

## 2012-11-24 LAB — HM DIABETES EYE EXAM

## 2012-12-01 ENCOUNTER — Other Ambulatory Visit: Payer: Self-pay | Admitting: Cardiovascular Disease

## 2012-12-01 ENCOUNTER — Other Ambulatory Visit (HOSPITAL_COMMUNITY): Payer: Self-pay | Admitting: Cardiovascular Disease

## 2012-12-01 DIAGNOSIS — I2510 Atherosclerotic heart disease of native coronary artery without angina pectoris: Secondary | ICD-10-CM | POA: Diagnosis not present

## 2012-12-01 DIAGNOSIS — R6889 Other general symptoms and signs: Secondary | ICD-10-CM | POA: Diagnosis not present

## 2012-12-01 DIAGNOSIS — I10 Essential (primary) hypertension: Secondary | ICD-10-CM | POA: Diagnosis not present

## 2012-12-01 DIAGNOSIS — R5381 Other malaise: Secondary | ICD-10-CM | POA: Diagnosis not present

## 2012-12-01 DIAGNOSIS — R011 Cardiac murmur, unspecified: Secondary | ICD-10-CM

## 2012-12-01 DIAGNOSIS — E782 Mixed hyperlipidemia: Secondary | ICD-10-CM | POA: Diagnosis not present

## 2012-12-01 DIAGNOSIS — E119 Type 2 diabetes mellitus without complications: Secondary | ICD-10-CM | POA: Diagnosis not present

## 2012-12-01 LAB — COMPREHENSIVE METABOLIC PANEL
AST: 14 U/L (ref 0–37)
Albumin: 4 g/dL (ref 3.5–5.2)
BUN: 16 mg/dL (ref 6–23)
CO2: 29 mEq/L (ref 19–32)
Calcium: 10 mg/dL (ref 8.4–10.5)
Chloride: 105 mEq/L (ref 96–112)
Glucose, Bld: 90 mg/dL (ref 70–99)
Potassium: 4.1 mEq/L (ref 3.5–5.3)

## 2012-12-01 LAB — CBC WITH DIFFERENTIAL/PLATELET
Basophils Absolute: 0 10*3/uL (ref 0.0–0.1)
HCT: 35 % — ABNORMAL LOW (ref 36.0–46.0)
Hemoglobin: 11.6 g/dL — ABNORMAL LOW (ref 12.0–15.0)
Lymphocytes Relative: 47 % — ABNORMAL HIGH (ref 12–46)
Lymphs Abs: 2.6 10*3/uL (ref 0.7–4.0)
Monocytes Absolute: 0.5 10*3/uL (ref 0.1–1.0)
Monocytes Relative: 8 % (ref 3–12)
Neutro Abs: 2.2 10*3/uL (ref 1.7–7.7)
RBC: 4.16 MIL/uL (ref 3.87–5.11)
WBC: 5.6 10*3/uL (ref 4.0–10.5)

## 2012-12-01 LAB — HEMOGLOBIN A1C: Mean Plasma Glucose: 146 mg/dL — ABNORMAL HIGH (ref <117)

## 2012-12-02 ENCOUNTER — Telehealth: Payer: Self-pay | Admitting: Cardiovascular Disease

## 2012-12-02 NOTE — Telephone Encounter (Signed)
Returning your call. °

## 2012-12-02 NOTE — Telephone Encounter (Signed)
JC will call pt on Monday.

## 2012-12-02 NOTE — Telephone Encounter (Signed)
Please call-ita mix up in her prescription-needs it a certain way-so it will be less expensive.

## 2012-12-02 NOTE — Telephone Encounter (Signed)
Returned call.  Left message to call back before 5pm or on Monday between 8am and 4pm.  Message forwarded to [facility]. Tressie Stalker, LPN.

## 2012-12-02 NOTE — Telephone Encounter (Signed)
Adjustment made to pt.s medication to [facility] pharmacy diovan 80 mg #90 with 3 refills

## 2012-12-07 DIAGNOSIS — E785 Hyperlipidemia, unspecified: Secondary | ICD-10-CM | POA: Diagnosis not present

## 2012-12-07 DIAGNOSIS — M899 Disorder of bone, unspecified: Secondary | ICD-10-CM | POA: Diagnosis not present

## 2012-12-07 DIAGNOSIS — I10 Essential (primary) hypertension: Secondary | ICD-10-CM | POA: Diagnosis not present

## 2012-12-07 DIAGNOSIS — E119 Type 2 diabetes mellitus without complications: Secondary | ICD-10-CM | POA: Diagnosis not present

## 2012-12-09 DIAGNOSIS — I10 Essential (primary) hypertension: Secondary | ICD-10-CM | POA: Diagnosis not present

## 2012-12-09 DIAGNOSIS — E119 Type 2 diabetes mellitus without complications: Secondary | ICD-10-CM | POA: Diagnosis not present

## 2012-12-09 DIAGNOSIS — E559 Vitamin D deficiency, unspecified: Secondary | ICD-10-CM | POA: Diagnosis not present

## 2012-12-09 DIAGNOSIS — M899 Disorder of bone, unspecified: Secondary | ICD-10-CM | POA: Diagnosis not present

## 2012-12-09 DIAGNOSIS — Z23 Encounter for immunization: Secondary | ICD-10-CM | POA: Diagnosis not present

## 2012-12-09 DIAGNOSIS — E785 Hyperlipidemia, unspecified: Secondary | ICD-10-CM | POA: Diagnosis not present

## 2012-12-12 ENCOUNTER — Ambulatory Visit (HOSPITAL_COMMUNITY)
Admission: RE | Admit: 2012-12-12 | Discharge: 2012-12-12 | Disposition: A | Discharge status: Home / Self Care | Payer: Medicare Other | Source: Ambulatory Visit | Attending: Cardiovascular Disease | Admitting: Cardiovascular Disease

## 2012-12-12 DIAGNOSIS — R011 Cardiac murmur, unspecified: Secondary | ICD-10-CM | POA: Insufficient documentation

## 2012-12-12 NOTE — Progress Notes (Signed)
2D Echo Performed 12/12/2012    Marygrace Drought, RCS

## 2012-12-13 ENCOUNTER — Encounter: Payer: Self-pay | Admitting: Cardiovascular Disease

## 2012-12-16 ENCOUNTER — Telehealth: Payer: Self-pay | Admitting: Cardiovascular Disease

## 2012-12-16 NOTE — Telephone Encounter (Signed)
Med ERX to [facility]

## 2012-12-16 NOTE — Telephone Encounter (Signed)
She still have not gotten her Diovan.[facility] said they had not received a prescription from you.

## 2012-12-19 ENCOUNTER — Other Ambulatory Visit: Payer: Self-pay | Admitting: *Deleted

## 2012-12-27 ENCOUNTER — Other Ambulatory Visit: Payer: Self-pay | Admitting: *Deleted

## 2012-12-27 MED ORDER — AMLODIPINE BESYLATE 10 MG PO TABS: 10.0000 mg | ORAL_TABLET | Freq: Every day | ORAL | Status: DC

## 2012-12-28 ENCOUNTER — Telehealth: Payer: Self-pay | Admitting: Cardiovascular Disease

## 2012-12-28 MED ORDER — AMLODIPINE BESYLATE 10 MG PO TABS: 10.0000 mg | ORAL_TABLET | Freq: Every day | ORAL | Status: DC

## 2012-12-28 NOTE — Telephone Encounter (Signed)
Returned call.  Pt informed message received and refill request not received.  Informed RN will send in refill to [facility].  Pt verbalized understanding and agreed w/ plan.  Refill(s) sent to pharmacy.  Dr. Gwenlyn Found will be new cardiologist.

## 2012-12-28 NOTE — Telephone Encounter (Signed)
[facility] refill on amlodipine 3 times  Still have not received approval. Ms Malanga says this does not go through [employer]  Please call her

## 2013-01-17 ENCOUNTER — Other Ambulatory Visit (INDEPENDENT_AMBULATORY_CARE_PROVIDER_SITE_OTHER): Payer: Medicare Other

## 2013-01-17 ENCOUNTER — Encounter: Payer: Self-pay | Admitting: Gastroenterology

## 2013-01-17 ENCOUNTER — Ambulatory Visit (INDEPENDENT_AMBULATORY_CARE_PROVIDER_SITE_OTHER): Payer: Medicare Other | Admitting: Gastroenterology

## 2013-01-17 VITALS — BP 134/60 | HR 57 | Ht 65.0 in | Wt 168.5 lb

## 2013-01-17 DIAGNOSIS — E119 Type 2 diabetes mellitus without complications: Secondary | ICD-10-CM | POA: Diagnosis not present

## 2013-01-17 DIAGNOSIS — K219 Gastro-esophageal reflux disease without esophagitis: Secondary | ICD-10-CM | POA: Diagnosis not present

## 2013-01-17 DIAGNOSIS — K5900 Constipation, unspecified: Secondary | ICD-10-CM

## 2013-01-17 DIAGNOSIS — D649 Anemia, unspecified: Secondary | ICD-10-CM

## 2013-01-17 LAB — FOLATE: Folate: 22.7 ng/mL (ref >5.9)

## 2013-01-17 LAB — IBC PANEL
Saturation Ratios: 22.9 % (ref 20.0–50.0)
Transferrin: 277.3 mg/dL (ref 212.0–360.0)

## 2013-01-17 NOTE — Progress Notes (Addendum)
This is a 72 year old African American female who has mild constipation. She has one to 2 bowel movements a day but has to strain with her bowel movements, as had 1 episode of a twinge of blood on the toilet paper. Colonoscopy 5 years ago was unremarkable. She does have lactose intolerance. Patient has chronic GERD and takes daily lansoprazole 30 mg occasionally lab the subxiphoid sharp pain it is relieved by drinking cold water. She has no true dysphagia or any hepatobiliary complaints. Review of her labs shows normal labs except for hemoglobin of 11.6 with normochromic indices. She does have diabetes and is on oral medications per Dr. Inda Merlin. She is followed by [facility] cardiology and is on several meds for hypertension, also aspirin 81 mg a day and vitamin D. Her appetite is good and her weight is stable.  Current Medications, Allergies, Past Medical History, Past Surgical History, Family History and Social History were reviewed in [employer] record.  ROS: All systems were reviewed and are negative unless otherwise stated in the HPI.          Physical Exam: Healthy-appearing patient in no distress. Blood pressure 134/60, pulse 57 and regular and weight 168 with a BMI of 28.04. I cannot appreciate stigmata of chronic liver disease. Her chest is clear and she is in a regular rhythm without murmurs gallops or rubs. There is no organomegaly, abdominal masses or tenderness. Bowel sounds are normal. Peripheral extremities are unremarkable and mental status is normal. Inspection of the rectum is unremarkable, and rectal exam shows no masses or tenderness with soft stool which is guaiac negative.    Assessment and Plan: Mild constipation related to lack of fiber in her diet. I have asked her to use Benefiber powder 1 tablespoon twice a day with her food and to increase her by mouth fluid intake. Also she's continue her Prevacid for acid reflux with when necessary and acid  use as needed. She also had an endoscopy 5 years ago which was fairly unremarkable. Because of a mild anemia I have ordered an anemia profile for review. Otherwise she is continue all medications as listed and reviewed per primary care. I do not think this patient needs repeat endoscopy or colonoscopy at this point. I have given her some information also concerning  lactose intolerant

## 2013-01-17 NOTE — Addendum Note (Signed)
Addended by: Hope Pigeon A on: 01/17/2013 11:22 AM   Modules accepted: Orders

## 2013-01-17 NOTE — Patient Instructions (Signed)
Please follow up in a year or sooner if symptoms reoccur  Please purchase Benefiber over the counter and take one tablespoon twice daily   Your physician has requested that you go to the basement for the following lab work before leaving today: Anemia Panel  Information on Constipation and Lactose Intolerance is below for your review.  ______________________________________________________________________________________________________________________________________________________________________________________________  Constipation, Adult Constipation is when a person has fewer than 3 bowel movements a week; has difficulty having a bowel movement; or has stools that are dry, hard, or larger than normal. As people grow older, constipation is more common. If you try to fix constipation with medicines that make you have a bowel movement (laxatives), the problem may get worse. Long-term laxative use may cause the muscles of the colon to become weak. A low-fiber diet, not taking in enough fluids, and taking certain medicines may make constipation worse. CAUSES   Certain medicines, such as antidepressants, pain medicine, iron supplements, antacids, and water pills.   Certain diseases, such as diabetes, irritable bowel syndrome (IBS), thyroid disease, or depression.   Not drinking enough water.   Not eating enough fiber-rich foods.   Stress or travel.  Lack of physical activity or exercise.  Not going to the restroom when there is the urge to have a bowel movement.  Ignoring the urge to have a bowel movement.  Using laxatives too much. SYMPTOMS   Having fewer than 3 bowel movements a week.   Straining to have a bowel movement.   Having hard, dry, or larger than normal stools.   Feeling full or bloated.   Pain in the lower abdomen.  Not feeling relief after having a bowel movement. DIAGNOSIS  Your caregiver will take a medical history and perform a physical exam.  Further testing may be done for severe constipation. Some tests may include:   A barium enema X-ray to examine your rectum, colon, and sometimes, your small intestine.  A sigmoidoscopy to examine your lower colon.  A colonoscopy to examine your entire colon. TREATMENT  Treatment will depend on the severity of your constipation and what is causing it. Some dietary treatments include drinking more fluids and eating more fiber-rich foods. Lifestyle treatments may include regular exercise. If these diet and lifestyle recommendations do not help, your caregiver may recommend taking over-the-counter laxative medicines to help you have bowel movements. Prescription medicines may be prescribed if over-the-counter medicines do not work.  HOME CARE INSTRUCTIONS   Increase dietary fiber in your diet, such as fruits, vegetables, whole grains, and beans. Limit high-fat and processed sugars in your diet, such as [place] fries, hamburgers, cookies, candies, and soda.   A fiber supplement may be added to your diet if you cannot get enough fiber from foods.   Drink enough fluids to keep your urine clear or pale yellow.   Exercise regularly or as directed by your caregiver.   Go to the restroom when you have the urge to go. Do not hold it.  Only take medicines as directed by your caregiver. Do not take other medicines for constipation without talking to your caregiver first. [place] IF:   You have bright red blood in your stool.   Your constipation lasts for more than 4 days or gets worse.   You have abdominal or rectal pain.   You have thin, pencil-like stools.  You have unexplained weight loss. MAKE SURE YOU:   Understand these instructions.  Will watch your condition.  Will get help right  away if you are not doing well or get worse. Document Released: 11/29/2003 Document Revised: 05/25/2011 Document Reviewed: 02/03/2011 ExitCare Patient Information 2014 ExitCare,  [place]. ___________________________________________________________________________________________________________________________________________________________________________________________  Lactose Intolerance, Adult Lactose intolerance is when the body is not able to digest lactose, a sugar found in milk and milk products. Lactose intolerance is caused by your body not producing enough of the enzyme lactase. When there is not enough lactase to digest the amount of lactose consumed, discomfort may be felt. Lactose intolerance is not a milk allergy. For most people, lactase deficiency is a condition that develops naturally over time. After about the age of 2, the body begins to produce less lactase. But many people may not experience symptoms until they are much older. CAUSES Things that can cause you to be lactose intolerant include:  Aging.  Being born without the ability to make lactase.  Certain digestive diseases.  Injuries to the small intestine. SYMPTOMS   Feeling sick to your stomach (nauseous).  Diarrhea.  Cramps.  Bloating.  Gas. Symptoms usually show up a half hour or 2 hours after eating or drinking products containing lactose. TREATMENT  No treatment can improve the body's ability to produce lactase. However, symptoms can be controlled through diet. A medicine may be given to you to take when you consume lactose-containing foods or drinks. The medicine contains the lactase enzyme, which help the body digest lactose better. HOME CARE INSTRUCTIONS  Eat or drink dairy products as told by your caregiver or dietician.  Take all medicine as directed by your caregiver.  Find lactose-free or lactose-reduced products at your local grocery store.  Talk to your caregiver or dietician to decide if you need any dietary supplements. The following is the amount of calcium needed from the diet:  19 to 50 years: 1000 mg  Over 50 years: 1200 mg Calcium and Lactose in Common  Foods Non-Dairy Products / Calcium Content (mg)  Calcium-fortified orange juice, 1 cup / 308 to 344 mg  Sardines, with edible bones, 3 oz / 270 mg  Salmon, canned, with edible bones, 3 oz / 205 mg  Soymilk, fortified, 1 cup / 200 mg  Broccoli (raw), 1 cup / 90 mg  Orange, 1 medium / 50 mg  Pinto beans,  cup / 40 mg  Tuna, canned, 3 oz / 10 mg  Lettuce greens,  cup / 10 mg Dairy Products / Calcium Content (mg) / Lactose Content (g)  Yogurt, plain, low-fat, 1 cup / 415 mg / 5 g  Milk, reduced fat, 1 cup / 295 mg / 11 g  Swiss cheese, 1 oz / 270 mg / 1 g  Ice cream,  cup / 85 mg / 6 g  Cottage cheese,  cup / 75 mg / 2 to 3 g SEEK MEDICAL CARE IF: You have no relief from your symptoms. Document Released: 03/02/2005 Document Revised: 05/25/2011 Document Reviewed: 05/30/2010 [facility] Patient Information 2014 [place], [place].

## 2013-03-01 ENCOUNTER — Telehealth: Payer: Self-pay | Admitting: Internal Medicine

## 2013-03-01 NOTE — Telephone Encounter (Signed)
Chart updated

## 2013-03-01 NOTE — Telephone Encounter (Signed)
Pt has flu shot at endocrinologist office

## 2013-03-15 ENCOUNTER — Telehealth: Payer: Self-pay | Admitting: *Deleted

## 2013-03-15 NOTE — Telephone Encounter (Signed)
Form faxed from [employer] prior auth needed for patient's Prevacid I filled form out and faxed back

## 2013-03-16 LAB — LIPID PANEL: LDL CALC: 72 mg/dL

## 2013-04-21 DIAGNOSIS — E785 Hyperlipidemia, unspecified: Secondary | ICD-10-CM | POA: Diagnosis not present

## 2013-04-21 DIAGNOSIS — I10 Essential (primary) hypertension: Secondary | ICD-10-CM | POA: Diagnosis not present

## 2013-04-21 DIAGNOSIS — M899 Disorder of bone, unspecified: Secondary | ICD-10-CM | POA: Diagnosis not present

## 2013-04-21 DIAGNOSIS — E119 Type 2 diabetes mellitus without complications: Secondary | ICD-10-CM | POA: Diagnosis not present

## 2013-04-21 DIAGNOSIS — M949 Disorder of cartilage, unspecified: Secondary | ICD-10-CM | POA: Diagnosis not present

## 2013-04-21 DIAGNOSIS — E559 Vitamin D deficiency, unspecified: Secondary | ICD-10-CM | POA: Diagnosis not present

## 2013-04-28 ENCOUNTER — Encounter: Payer: Self-pay | Admitting: Internal Medicine

## 2013-04-28 ENCOUNTER — Ambulatory Visit (INDEPENDENT_AMBULATORY_CARE_PROVIDER_SITE_OTHER): Payer: Medicare Other | Admitting: Internal Medicine

## 2013-04-28 VITALS — BP 120/80 | HR 73 | Temp 99.5°F | Ht 65.0 in | Wt 174.0 lb

## 2013-04-28 DIAGNOSIS — Z0000 Encounter for general adult medical examination without abnormal findings: Secondary | ICD-10-CM | POA: Diagnosis not present

## 2013-04-28 DIAGNOSIS — M129 Arthropathy, unspecified: Secondary | ICD-10-CM

## 2013-04-28 DIAGNOSIS — E7800 Pure hypercholesterolemia, unspecified: Secondary | ICD-10-CM

## 2013-04-28 DIAGNOSIS — I10 Essential (primary) hypertension: Secondary | ICD-10-CM

## 2013-04-28 DIAGNOSIS — E119 Type 2 diabetes mellitus without complications: Secondary | ICD-10-CM | POA: Diagnosis not present

## 2013-04-28 DIAGNOSIS — C50919 Malignant neoplasm of unspecified site of unspecified female breast: Secondary | ICD-10-CM

## 2013-04-28 NOTE — Progress Notes (Signed)
Subjective:    Patient ID: Donna May, female    DOB: Dec 22, 1940, 73 y.o.   MRN: [id number]  Diabetes Pertinent negatives for hypoglycemia include no dizziness, headaches, nervousness/anxiousness or speech difficulty. Pertinent negatives for diabetes include no chest pain and no weakness.  Hypertension Pertinent negatives include no chest pain, headaches, palpitations or shortness of breath.  73 year-old patient who is seen today for a wellness exam;  medical problems include diabetes hypertension and dyslipidemia. She has remote history of right breast cancer. Social consultants include cardiology Rollene Fare), GYN, endocrinology (Alzheimer) and ophthalmology Gershon Crane)  She feels as she has had a colonoscopy about 4 or 5 years ago.  She has seen gastroenterology within the past several.  She did have a mammogram last year and her next eye exam is due in June of this year.  Family history father died at 66 had a history of arthritis and senile dementia Mother died at age 4 her first MI at 40 status post CABG. She also had MS 2 brothers both with coronary artery disease one brother deceased also a history of diabetes.  Surgical history status post mastectomy in 1992;  has had a rectovaginal fistula repaired in 1970 remote tonsillectomy in 1958  Retired from the [employer] school system   1. Risk factors, based on past  M,S,F history- cardiovascular risk factors include diabetes hypertension and dyslipidemia  2.  Physical activities: No exercise limitations but does have some bilateral knee pain 3.  Depression/mood: No history depression in her meds or her 4.  Hearing: No significant deficits  5.  ADL's: Independent in all aspects of daily living  6.  Fall risk: Low  7.  Home safety: No problems identified  8.  Height weight, and visual acuity;  9.  Counseling: Regular exercise restricted salt and calorie diet encouraged  10. Lab orders based on risk factors:  11.  Referral : Followup with GYN cardiology endocrinology  12. Care plan: No change in regimen more exercise modest weight loss all encouraged 13. Cognitive assessment: Alert and oriented with normal affect no cognitive dysfunction    Review of Systems  Constitutional: Negative.   HENT: Negative for congestion, dental problem, hearing loss, rhinorrhea, sinus pressure, sore throat and tinnitus.   Eyes: Negative for pain, discharge and visual disturbance.  Respiratory: Negative for cough and shortness of breath.   Cardiovascular: Negative for chest pain, palpitations and leg swelling.  Gastrointestinal: Negative for nausea, vomiting, abdominal pain, diarrhea, constipation, blood in stool and abdominal distention.  Genitourinary: Negative for dysuria, urgency, frequency, hematuria, flank pain, vaginal bleeding, vaginal discharge, difficulty urinating, vaginal pain and pelvic pain.  Musculoskeletal: Positive for arthralgias (knee and shoulder discomfort). Negative for gait problem and joint swelling.  Skin: Negative for rash.  Neurological: Negative for dizziness, syncope, speech difficulty, weakness, numbness and headaches.  Hematological: Negative for adenopathy.  Psychiatric/Behavioral: Negative for behavioral problems, dysphoric mood and agitation. The patient is not nervous/anxious.        Objective:   Physical Exam  Constitutional: She is oriented to person, place, and time. She appears well-developed and well-nourished.  HENT:  Head: Normocephalic.  Right Ear: External ear normal.  Left Ear: External ear normal.  Mouth/Throat: Oropharynx is clear and moist.  Eyes: Conjunctivae and EOM are normal. Pupils are equal, round, and reactive to light.  Neck: Normal range of motion. Neck supple. No thyromegaly present.  Cardiovascular: Normal rate and regular rhythm.   Murmur heard. Grade 1- 2/6 systolic ejection murmur  The right dorsalis pedis pulse full.  Other pedal pulses not easily  palpable  Pulmonary/Chest: Effort normal and breath sounds normal.  Status post partial right mastectomy  Abdominal: Soft. Bowel sounds are normal. She exhibits no mass. There is no tenderness.  Musculoskeletal: Normal range of motion.  Lymphadenopathy:    She has no cervical adenopathy.  Neurological: She is alert and oriented to person, place, and time.  Intact to vibratory sensation and monofilament testing  Skin: Skin is warm and dry. No rash noted.  Psychiatric: She has a normal mood and affect. Her behavior is normal.          Assessment & Plan:   Preventive health examination Diabetes Hypertension Dyslipidemia  In view of her multiple consultants will reassess in one year

## 2013-04-28 NOTE — Patient Instructions (Signed)
Limit your sodium (Salt) intake    It is important that you exercise regularly, at least 20 minutes 3 to 4 times per week.  If you develop chest pain or shortness of breath seek  medical attention.   Please check your hemoglobin A1c every 3 months  Please check your blood pressure on a regular basis.  If it is consistently greater than 150/90, please make an office appointment.  Return in one year for follow-up

## 2013-04-28 NOTE — Progress Notes (Signed)
Pre visit review using our clinic review tool, if applicable. No additional management support is needed unless otherwise documented below in the visit note. 

## 2013-05-01 ENCOUNTER — Telehealth: Payer: Self-pay | Admitting: Internal Medicine

## 2013-05-01 NOTE — Telephone Encounter (Signed)
Relevant patient education mailed to patient.  

## 2013-06-02 ENCOUNTER — Ambulatory Visit (INDEPENDENT_AMBULATORY_CARE_PROVIDER_SITE_OTHER): Payer: Medicare Other | Admitting: Cardiovascular Disease

## 2013-06-02 ENCOUNTER — Encounter: Payer: Self-pay | Admitting: Cardiovascular Disease

## 2013-06-02 VITALS — BP 132/78 | HR 59 | Resp 16 | Ht 65.0 in | Wt 171.9 lb

## 2013-06-02 DIAGNOSIS — I701 Atherosclerosis of renal artery: Secondary | ICD-10-CM

## 2013-06-02 DIAGNOSIS — I10 Essential (primary) hypertension: Secondary | ICD-10-CM

## 2013-06-02 MED ORDER — METOPROLOL SUCCINATE ER 50 MG PO TB24: ORAL_TABLET | ORAL | Status: DC

## 2013-06-02 NOTE — Patient Instructions (Signed)
Your physician has requested that you have a renal artery duplex. During this test, an ultrasound is used to evaluate blood flow to the kidneys. Allow one hour for this exam. Do not eat after midnight the day before and avoid carbonated beverages. Take your medications as you usually do.  Your physician recommends that you schedule a follow-up appointment in: 6 Months with Dr. Sallyanne Kuster.

## 2013-06-09 ENCOUNTER — Encounter: Payer: Self-pay | Admitting: Cardiovascular Disease

## 2013-06-09 DIAGNOSIS — I701 Atherosclerosis of renal artery: Secondary | ICD-10-CM | POA: Insufficient documentation

## 2013-06-09 NOTE — Progress Notes (Signed)
Patient ID: Donna May, female   DOB: Oct 28, 1940, 73 y.o.   MRN: [id number]      Reason for office visit New cardiology followup  Donna May is a 73 year old woman with a history of multiple coronary risk factors include type 2 diabetes mellitus, hypercholesterolemia and hypertension without known coronary or structural heart disease. She does have a family history of coronary disease. She was a patient of Dr. Georgiann Mccoy for many years. Some of her records suggest that she has had previous angiography but she has actually never had a cardiac catheterization. Both patient's parents were also patients of Dr. Rollene Fare to have extensive coronary problems.  Donna May has mild renal artery stenosis bilaterally (less than 60%) renal aortic ratio is only 2.1-2.2. There is a note that the velocities in the left side had increased slightly since the previous study but they remain nominally elevated at 164 cm/s. No function parameters are normal  She has no cardiac complaints other than rare episodes of ankle swelling and occasional dizziness. She denies chest pain or shortness of breath either at rest or with activity. She had had some pain in the back of her neck and back of her head that is clearly musculoskeletal. She has a cough for about the last 6 weeks possibly related to allergies. Laboratory tests were performed with Dr. Elyse Hsu about a month ago but are not currently available for review. The patient's reports that they were "good". Her hemoglobin A1c last September was excellent at 6.7% in the most recent lipid profile that I have available for review shows parameters are well within the desirable range with an LDL of 64 and HDL of 51   No Known Allergies  Current Outpatient Prescriptions  Medication Sig Dispense Refill  . amLODipine (NORVASC) 10 MG tablet Take 1 tablet (10 mg total) by mouth daily.  90 tablet  1  . aspirin 81 MG tablet Take 81 mg by mouth daily.      Marland Kitchen  CALCIUM-MAGNESIUM-VITAMIN D PO Take 1 tablet by mouth daily.      . Cholecalciferol (VITAMIN D3) 2000 UNITS TABS Take 1 tablet by mouth daily.      . CRESTOR 20 MG tablet Take 20 mg by mouth daily.       . Echinacea 400 MG CAPS Take 1 capsule by mouth daily.      Marland Kitchen EVISTA 60 MG tablet Take 60 mg by mouth daily.       . Glucosamine-Chondroit-Vit C-Mn (GLUCOSAMINE CHONDR 1500 COMPLX PO) Take 1 tablet by mouth daily.      Marland Kitchen JANUVIA 100 MG tablet Take 100 mg by mouth daily.       . lansoprazole (PREVACID) 30 MG capsule Take one tablet by mouth once daily 30 minutes before breakfast.  90 capsule  1  . metFORMIN (GLUCOPHAGE-XR) 500 MG 24 hr tablet Take 500 mg by mouth 2 (two) times daily before a meal.       . metoprolol succinate (TOPROL-XL) 50 MG 24 hr tablet Take 1 1/2 tablets daily  135 tablet  3  . Multiple Vitamins-Minerals (CENTRUM SILVER ULTRA WOMENS) TABS Take 1 tablet by mouth daily.      . valsartan (DIOVAN) 80 MG tablet Take 80 mg by mouth daily.       . valsartan-hydrochlorothiazide (DIOVAN-HCT) 160-12.5 MG per tablet Take 1 tablet by mouth daily.        No current facility-administered medications for this visit.    Past Medical History  Diagnosis Date  . Arthritis   . Breast cancer 1992  . Diabetes mellitus   . Heart murmur   . Hypertension   . Hyperlipidemia   . Fainting spell   . UTI (lower urinary tract infection)   . Duodenitis 01/18/2002  . GERD (gastroesophageal reflux disease)   . Hiatal hernia 08/08/2008, 01/18/2002  . History of pneumonia   . Anemia     Past Surgical History  Procedure Laterality Date  . Mastectomy  1992    right, with flap  . Tonsillectomy    . Axillary surgery      cyst removal, right    Family History  Problem Relation Age of Onset  . Breast cancer Cousin   . Diabetes Brother   . Heart disease Mother   . Heart disease Father   . Prostate cancer Brother     History   Social History  . Marital Status: Single    Spouse Name: N/A      Number of Children: 2  . Years of Education: N/A   Occupational History  . retired    Social History Main Topics  . Smoking status: Former Research scientist (life sciences)  . Smokeless tobacco: Never Used  . Alcohol Use: Yes     Comment: ocass  . Drug Use: No  . Sexual Activity: Not on file   Other Topics Concern  . Not on file   Social History Narrative  . No narrative on file    Review of systems: The patient specifically denies any chest pain at rest or with exertion, dyspnea at rest or with exertion, orthopnea, paroxysmal nocturnal dyspnea, syncope, palpitations, focal neurological deficits, intermittent claudication, lower extremity edema, unexplained weight gain, cough, hemoptysis or wheezing.  The patient also denies abdominal pain, nausea, vomiting, dysphagia, diarrhea, constipation, polyuria, polydipsia, dysuria, hematuria, frequency, urgency, abnormal bleeding or bruising, fever, chills, unexpected weight changes, mood swings, change in skin or hair texture, change in voice quality, auditory or visual problems, allergic reactions or rashes, new musculoskeletal complaints other than usual "aches and pains".   PHYSICAL EXAM BP 132/78  Pulse 59  Resp 16  Ht 5\' 5"  (1.651 m)  Wt 77.973 kg (171 lb 14.4 oz)  BMI 28.61 kg/m2  General: Alert, oriented x3, no distress Head: no evidence of trauma, PERRL, EOMI, no exophtalmos or lid lag, no myxedema, no xanthelasma; normal ears, nose and oropharynx Neck: normal jugular venous pulsations and no hepatojugular reflux; brisk carotid pulses without delay and no carotid bruits Chest: clear to auscultation, no signs of consolidation by percussion or palpation, normal fremitus, symmetrical and full respiratory excursions Cardiovascular: normal position and quality of the apical impulse, regular rhythm, normal first and second heart sounds, no murmurs, rubs or gallops Abdomen: no tenderness or distention, no masses by palpation, no abnormal pulsatility or  arterial bruits, normal bowel sounds, no hepatosplenomegaly Extremities: no clubbing, cyanosis or edema; 2+ radial, ulnar and brachial pulses bilaterally; 2+ right femoral, posterior tibial and dorsalis pedis pulses; 2+ left femoral, posterior tibial and dorsalis pedis pulses; no subclavian or femoral bruits Neurological: grossly nonfocal   EKG: Normal sinus rhythm, normal  Lipid Panel  Total cholesterol 134, trig was read 48, HDL 51, LDL 64  BMET    Component Value Date/Time   NA 141 12/01/2012 1105   K 4.1 12/01/2012 1105   CL 105 12/01/2012 1105   CO2 29 12/01/2012 1105   GLUCOSE 90 12/01/2012 1105   BUN 16 12/01/2012 1105   CREATININE 1.05 12/01/2012  1105   CREATININE 1.1 01/26/2009 1824   CALCIUM 10.0 12/01/2012 1105     ASSESSMENT AND PLAN  Donna May is slightly overweight but her blood pressure is well controlled and both her diabetes and hyperlipidemia are well controlled on the current pharmacological regimen. No changes are recommended to her medications today. Since there have been recent changes in her renal artery assessment will repeat these at the 12 month point. She would like to followup every 6 months.  Patient Instructions  Your physician has requested that you have a renal artery duplex. During this test, an ultrasound is used to evaluate blood flow to the kidneys. Allow one hour for this exam. Do not eat after midnight the day before and avoid carbonated beverages. Take your medications as you usually do.  Your physician recommends that you schedule a follow-up appointment in: 6 Months with Dr. Sallyanne Kuster.      Orders Placed This Encounter  Procedures  . EKG 12-Lead  . Renal Artery Duplex   Meds ordered this encounter  Medications  . metoprolol succinate (TOPROL-XL) 50 MG 24 hr tablet    Sig: Take 1 1/2 tablets daily    Dispense:  135 tablet    Refill:  3    Rebakah Cokley  Sanda Klein, MD, [facility] [phone] office [phone]  pager

## 2013-06-12 ENCOUNTER — Other Ambulatory Visit: Payer: Self-pay | Admitting: Gastroenterology

## 2013-06-12 NOTE — Telephone Encounter (Signed)
Rx sent 

## 2013-06-12 NOTE — Telephone Encounter (Signed)
yes

## 2013-06-12 NOTE — Telephone Encounter (Signed)
I have spoken to patient and have advised that since Dr Sharlett Iles has retired, she needs to establish care with another physician here in our practice. She has no preference regarding who takes over her care. Therefore, she will be scheduled with Dr Deatra Ina (doctor of the day). She is scheduled for office visit on 07/27/13. Dr Deatra Ina, May I fill patient's Prevacid until her office visit with you?

## 2013-06-20 ENCOUNTER — Ambulatory Visit (HOSPITAL_COMMUNITY)
Admission: RE | Admit: 2013-06-20 | Discharge: 2013-06-20 | Disposition: A | Discharge status: Home / Self Care | Payer: Medicare Other | Source: Ambulatory Visit | Attending: Internal Medicine | Admitting: Internal Medicine

## 2013-06-20 DIAGNOSIS — I10 Essential (primary) hypertension: Secondary | ICD-10-CM | POA: Diagnosis not present

## 2013-06-20 DIAGNOSIS — I701 Atherosclerosis of renal artery: Secondary | ICD-10-CM | POA: Insufficient documentation

## 2013-06-20 NOTE — Progress Notes (Signed)
Renal Duplex Completed. Tanja Gift, BS, RDMS, RVT  

## 2013-07-25 ENCOUNTER — Ambulatory Visit: Payer: Medicare Other | Admitting: Internal Medicine

## 2013-07-25 ENCOUNTER — Other Ambulatory Visit: Payer: Self-pay | Admitting: *Deleted

## 2013-07-25 MED ORDER — AMLODIPINE BESYLATE 10 MG PO TABS: 10.0000 mg | ORAL_TABLET | Freq: Every day | ORAL | Status: DC

## 2013-07-25 NOTE — Telephone Encounter (Signed)
Rx refill sent to patient pharmacy   

## 2013-07-27 ENCOUNTER — Ambulatory Visit: Payer: Medicare Other | Admitting: Gastroenterology

## 2013-08-09 ENCOUNTER — Ambulatory Visit (INDEPENDENT_AMBULATORY_CARE_PROVIDER_SITE_OTHER): Payer: Medicare Other | Admitting: Gastroenterology

## 2013-08-09 ENCOUNTER — Encounter: Payer: Self-pay | Admitting: Gastroenterology

## 2013-08-09 VITALS — BP 140/70 | HR 60 | Ht 65.0 in | Wt 168.5 lb

## 2013-08-09 DIAGNOSIS — I701 Atherosclerosis of renal artery: Secondary | ICD-10-CM | POA: Diagnosis not present

## 2013-08-09 DIAGNOSIS — K219 Gastro-esophageal reflux disease without esophagitis: Secondary | ICD-10-CM

## 2013-08-09 DIAGNOSIS — Z1211 Encounter for screening for malignant neoplasm of colon: Secondary | ICD-10-CM | POA: Diagnosis not present

## 2013-08-09 NOTE — Assessment & Plan Note (Signed)
Well-controlled with Prevacid.  Plan to continue with the same.

## 2013-08-09 NOTE — Patient Instructions (Signed)
We will renew your prevacid and send to your pharmacy

## 2013-08-09 NOTE — Progress Notes (Signed)
_                                                                                                                History of Present Illness: Pleasant 73 year old African female with history of GERD, chronic idiopathic constipation, here for routine followup.  She has occasional upper epigastric pain which is assuaged by drinking cold water.  Constipation is much improved with fiber supplementation.  Last colonoscopy in 2010 was normal.  She has no new GI complaints.  She takes Prevacid.    Past Medical History  Diagnosis Date  . Arthritis   . Breast cancer 1992  . Diabetes mellitus   . Heart murmur   . Hypertension   . Hyperlipidemia   . Fainting spell   . UTI (lower urinary tract infection)   . Duodenitis 01/18/2002  . GERD (gastroesophageal reflux disease)   . Hiatal hernia 08/08/2008, 01/18/2002  . History of pneumonia   . Anemia    Past Surgical History  Procedure Laterality Date  . Mastectomy  1992    right, with flap  . Tonsillectomy    . Axillary surgery      cyst removal, right   family history includes Breast cancer in her cousin; Diabetes in her brother; Heart disease in her father and mother; Prostate cancer in her brother. Current Outpatient Prescriptions  Medication Sig Dispense Refill  . amLODipine (NORVASC) 10 MG tablet Take 1 tablet (10 mg total) by mouth daily.  90 tablet  1  . aspirin 81 MG tablet Take 81 mg by mouth daily.      Marland Kitchen CALCIUM-MAGNESIUM-VITAMIN D PO Take 1 tablet by mouth daily.      . Cholecalciferol (VITAMIN D3) 2000 UNITS TABS Take 1 tablet by mouth daily.      . CRESTOR 20 MG tablet Take 20 mg by mouth daily.       . Echinacea 400 MG CAPS Take 1 capsule by mouth daily.      Marland Kitchen EVISTA 60 MG tablet Take 60 mg by mouth daily.       . Glucosamine-Chondroit-Vit C-Mn (GLUCOSAMINE CHONDR 1500 COMPLX PO) Take 1 tablet by mouth daily.      Marland Kitchen JANUVIA 100 MG tablet Take 100 mg by mouth daily.       . lansoprazole (PREVACID) 30 MG  capsule Take 1 capsule (30 mg total) by mouth daily before breakfast.  90 capsule  0  . metFORMIN (GLUCOPHAGE-XR) 500 MG 24 hr tablet Take 500 mg by mouth 2 (two) times daily before a meal.       . metoprolol succinate (TOPROL-XL) 50 MG 24 hr tablet Take 1 1/2 tablets daily  135 tablet  3  . Multiple Vitamins-Minerals (CENTRUM SILVER ULTRA WOMENS) TABS Take 1 tablet by mouth daily.      . valsartan (DIOVAN) 80 MG tablet Take 80 mg by mouth daily.       . valsartan-hydrochlorothiazide (DIOVAN-HCT) 160-12.5 MG per tablet Take 1 tablet by mouth daily.  No current facility-administered medications for this visit.   Allergies as of 08/09/2013  . (No Known Allergies)    reports that she has quit smoking. She has never used smokeless tobacco. She reports that she drinks alcohol. She reports that she does not use illicit drugs.     Review of Systems: Pertinent positive and negative review of systems were noted in the above HPI section. All other review of systems were otherwise negative.  Vital signs were reviewed in today's medical record Physical Exam: General: Well developed , well nourished, no acute distress Skin: anicteric Head: Normocephalic and atraumatic Eyes:  sclerae anicteric, EOMI Ears: Normal auditory acuity Mouth: No deformity or lesions Neck: Supple, no masses or thyromegaly Lungs: Clear throughout to auscultation Heart: Regular rate and rhythm; no murmurs, rubs or bruits Abdomen: Soft, non tender and non distended. No masses, hepatosplenomegaly or hernias noted. Normal Bowel sounds Rectal:deferred Musculoskeletal: Symmetrical with no gross deformities  Skin: No lesions on visible extremities Pulses:  Normal pulses noted Extremities: No clubbing, cyanosis, edema or deformities noted Neurological: Alert oriented x 4, grossly nonfocal Cervical Nodes:  No significant cervical adenopathy Inguinal Nodes: No significant inguinal adenopathy Psychological:  Alert and  cooperative. Normal mood and affect  See Assessment and Plan under Problem List

## 2013-08-09 NOTE — Assessment & Plan Note (Signed)
Plan followup colonoscopy 2020

## 2013-08-14 LAB — HEMOGLOBIN A1C: Hgb A1c MFr Bld: 6.9 % — AB (ref 4.0–6.0)

## 2013-09-07 DIAGNOSIS — E119 Type 2 diabetes mellitus without complications: Secondary | ICD-10-CM | POA: Diagnosis not present

## 2013-09-07 DIAGNOSIS — E785 Hyperlipidemia, unspecified: Secondary | ICD-10-CM | POA: Diagnosis not present

## 2013-09-07 DIAGNOSIS — E559 Vitamin D deficiency, unspecified: Secondary | ICD-10-CM | POA: Diagnosis not present

## 2013-09-07 DIAGNOSIS — I10 Essential (primary) hypertension: Secondary | ICD-10-CM | POA: Diagnosis not present

## 2013-09-07 DIAGNOSIS — M899 Disorder of bone, unspecified: Secondary | ICD-10-CM | POA: Diagnosis not present

## 2013-10-16 DIAGNOSIS — B353 Tinea pedis: Secondary | ICD-10-CM | POA: Diagnosis not present

## 2013-10-18 DIAGNOSIS — Z853 Personal history of malignant neoplasm of breast: Secondary | ICD-10-CM | POA: Diagnosis not present

## 2013-10-18 DIAGNOSIS — Z1231 Encounter for screening mammogram for malignant neoplasm of breast: Secondary | ICD-10-CM | POA: Diagnosis not present

## 2013-10-18 LAB — HM MAMMOGRAPHY

## 2013-10-25 ENCOUNTER — Encounter: Payer: Self-pay | Admitting: Internal Medicine

## 2013-10-31 ENCOUNTER — Telehealth: Payer: Self-pay | Admitting: Gastroenterology

## 2013-10-31 ENCOUNTER — Encounter: Payer: Self-pay | Admitting: Gastroenterology

## 2013-10-31 MED ORDER — LANSOPRAZOLE 30 MG PO CPDR: 30.0000 mg | DELAYED_RELEASE_CAPSULE | Freq: Every day | ORAL | Status: DC

## 2013-10-31 NOTE — Telephone Encounter (Signed)
Med sent l/m for pt

## 2013-11-29 ENCOUNTER — Telehealth: Payer: Self-pay | Admitting: *Deleted

## 2013-11-29 NOTE — Telephone Encounter (Signed)
Spoke with patient and she will call back and schedule an appointment for vaccines and blood pressure.

## 2013-12-01 ENCOUNTER — Ambulatory Visit (INDEPENDENT_AMBULATORY_CARE_PROVIDER_SITE_OTHER): Payer: Medicare Other

## 2013-12-01 ENCOUNTER — Ambulatory Visit: Payer: Self-pay

## 2013-12-01 VITALS — BP 154/77 | HR 58 | Resp 16 | Ht 65.0 in | Wt 164.0 lb

## 2013-12-01 DIAGNOSIS — M79673 Pain in unspecified foot: Secondary | ICD-10-CM

## 2013-12-01 DIAGNOSIS — I701 Atherosclerosis of renal artery: Secondary | ICD-10-CM

## 2013-12-01 DIAGNOSIS — M7731 Calcaneal spur, right foot: Secondary | ICD-10-CM

## 2013-12-01 DIAGNOSIS — M79609 Pain in unspecified limb: Secondary | ICD-10-CM | POA: Diagnosis not present

## 2013-12-01 DIAGNOSIS — M722 Plantar fascial fibromatosis: Secondary | ICD-10-CM

## 2013-12-01 DIAGNOSIS — M7730 Calcaneal spur, unspecified foot: Secondary | ICD-10-CM

## 2013-12-01 MED ORDER — TRIAMCINOLONE ACETONIDE 10 MG/ML IJ SUSP: 10.0000 mg | Freq: Once | INTRAMUSCULAR | Status: AC

## 2013-12-01 MED ORDER — MELOXICAM 15 MG PO TABS: 15.0000 mg | ORAL_TABLET | Freq: Every day | ORAL | Status: DC

## 2013-12-01 NOTE — Progress Notes (Signed)
   Subjective:    Patient ID: Donna May, female    DOB: November 12, 1940, 73 y.o.   MRN: [id number]  HPI Comments: "I think I have that plantar fasciitis again"  Patient c/o aching plantar/medial heel right for about a month. She has AM pain. Walking makes worse. She has tried stretching and rolling ice bottle at home-some help.  Also, patient states that she has some itching lateral left foot. She did have an open sore there, but has now healed. The skin is scaly. She uses cream, but that area where the open sore was is tender.  Foot Pain Associated symptoms include arthralgias, diaphoresis, headaches and a sore throat.      Review of Systems  Constitutional: Positive for diaphoresis and unexpected weight change.  HENT: Positive for sinus pressure, sore throat and tinnitus.   Gastrointestinal: Positive for constipation.  Endocrine: Positive for polyuria.  Genitourinary: Positive for urgency.  Musculoskeletal: Positive for arthralgias and gait problem.       Change in nails  Neurological: Positive for dizziness and headaches.  All other systems reviewed and are negative.      Objective:   Physical Exam 71 or ask him to American female well-developed well-nourished oriented x3 presents this time with a couple month history of heel pain medial heel and arch pain extending up in the ankle and foot. Hurts on first up or getting up for a period of rest patient also has some paresthesia possibly associated with diabetic neuropathy in the past is having open sore the lateral left foot along fifth metatarsal base over that is healed  Lower extremity objective findings vascular status is intact DP +2/4 PT plus one over 4 bilateral capillary refill time 3 seconds all digits epicritic and proprioceptive sensations intact but diminished on Semmes Weinstein to the forefoot digits and arch there is some hyperesthesia noted at times with paresthesia and burning and stinging in the feet and cramping  and legs at times. Neurologically skin color pigment normal hair growth absent nails criptotic incurvated friable there is a keratoses HD 5 bilateral no open wounds or ulcers no history of ulcer lateral left foot noted there is pre-also keratoses fifth digits bilateral semirigid digital contractures hammertoe deformities 2 through 5 with adductovarus rotated fourth and fifth. No current open wounds or infections noted       Assessment & Plan:  Assessment this time diabetes with history peripheral neuropathy also history keratoses and history of ulceration no active ulcers at the present time patient is having pain points the medial been plantar fascia right heel x-rays confirm well developed inferior heel spur thickening plantar fascial structures anterior breach of cyma line promontory changes of the foot no fractures cyst or tumors noted assessment plantar fasciitis/heel spur syndrome right history of diabetes with neuropathy and possible complications and pre-also keratoses with my benefit from diabetic shoe followup in the future to at this time injection tender with Kenalog and Xylocaine plain infiltrated to the right heel fascial strapping applied to the right foot with ice and sub-dispensed prescription for meloxicam or MOBIC 15 mg once daily recheck in 2 weeks for followup and reevaluation and be candidate for orthoses or insoles posse diabetic insoles and shoes would be beneficial in the future to prevent both ulceration and complications as well as maintain structure for the plantar fascia.  Harriet Masson DPM

## 2013-12-01 NOTE — Patient Instructions (Signed)

## 2013-12-04 ENCOUNTER — Ambulatory Visit: Payer: Self-pay

## 2013-12-11 ENCOUNTER — Encounter: Payer: Self-pay | Admitting: Internal Medicine

## 2013-12-19 ENCOUNTER — Ambulatory Visit: Payer: Medicare Other

## 2013-12-19 VITALS — BP 130/66 | HR 65 | Resp 13 | Ht 65.5 in | Wt 164.0 lb

## 2013-12-19 DIAGNOSIS — M722 Plantar fascial fibromatosis: Secondary | ICD-10-CM

## 2013-12-19 DIAGNOSIS — M7731 Calcaneal spur, right foot: Secondary | ICD-10-CM

## 2013-12-19 DIAGNOSIS — M79673 Pain in unspecified foot: Secondary | ICD-10-CM

## 2013-12-19 NOTE — Patient Instructions (Signed)

## 2013-12-19 NOTE — Progress Notes (Signed)
   Subjective:    Patient ID: Donna May, female    DOB: 26-Oct-1940, 73 y.o.   MRN: [id number]  HPI Comments: Pt states her right foot is a little better, and she did not take the Mobic, because she read the side effects and it scared her.     Review of Systems no new findings or systemic changes noted     Objective:   Physical Exam Neurovascular status is intact and unchanged the strapping help wound was then temporarily there is minimal reproducible pain on palpation medial plantar fascia right foot this time patient wearing a pair has type flimsy sneakers which provided no support would benefit from new functional orthoses. Orthotics no current your insurance plan at this time offered OTC power step orthotic for minimal cost is an uncovered service.       Assessment & Plan:  Assessment plantar fasciitis/heel spur syndrome improve with fascial strapping dispensed power step orthotic recheck in one to 2 months for adjustments as needed Donna May instructions for orthotic use in break in are given at this time.  Harriet Masson DPM

## 2013-12-22 DIAGNOSIS — H2513 Age-related nuclear cataract, bilateral: Secondary | ICD-10-CM | POA: Diagnosis not present

## 2013-12-22 DIAGNOSIS — E119 Type 2 diabetes mellitus without complications: Secondary | ICD-10-CM | POA: Diagnosis not present

## 2013-12-22 LAB — HM DIABETES EYE EXAM

## 2013-12-27 ENCOUNTER — Ambulatory Visit (INDEPENDENT_AMBULATORY_CARE_PROVIDER_SITE_OTHER): Payer: Medicare Other | Admitting: Cardiovascular Disease

## 2013-12-27 ENCOUNTER — Encounter: Payer: Self-pay | Admitting: Cardiovascular Disease

## 2013-12-27 VITALS — BP 148/72 | HR 59 | Resp 20 | Ht 65.0 in | Wt 171.8 lb

## 2013-12-27 DIAGNOSIS — I701 Atherosclerosis of renal artery: Secondary | ICD-10-CM

## 2013-12-27 DIAGNOSIS — I739 Peripheral vascular disease, unspecified: Secondary | ICD-10-CM | POA: Diagnosis not present

## 2013-12-27 DIAGNOSIS — E7800 Pure hypercholesterolemia, unspecified: Secondary | ICD-10-CM

## 2013-12-27 DIAGNOSIS — M79605 Pain in left leg: Secondary | ICD-10-CM

## 2013-12-27 DIAGNOSIS — M79604 Pain in right leg: Secondary | ICD-10-CM

## 2013-12-27 DIAGNOSIS — I10 Essential (primary) hypertension: Secondary | ICD-10-CM | POA: Diagnosis not present

## 2013-12-27 MED ORDER — AMLODIPINE BESYLATE 10 MG PO TABS: 10.0000 mg | ORAL_TABLET | Freq: Every day | ORAL | Status: DC

## 2013-12-27 MED ORDER — VALSARTAN-HYDROCHLOROTHIAZIDE 320-12.5 MG PO TABS: 1.0000 | ORAL_TABLET | Freq: Every day | ORAL | Status: DC

## 2013-12-27 NOTE — Patient Instructions (Signed)
STOP Valsartan 80mg  and Valsartan HCT 160/12.5mg  tablets (when your new prescription arrives from [facility].  START Valsartan HCTZ 320/12.5mg  daily.  Your physician has requested that you have a renal artery duplex. During this test, an ultrasound is used to evaluate blood flow to the kidneys. Allow one hour for this exam. Do not eat after midnight the day before and avoid carbonated beverages. Take your medications as you usually do.  Your physician has requested that you have a lower extremity arterial exercise duplex. During this test, exercise and ultrasound are used to evaluate arterial blood flow in the legs. Allow one hour for this exam. There are no restrictions or special instructions.  Dr. Sallyanne Kuster recommends that you schedule a follow-up appointment in:  One year.

## 2013-12-28 ENCOUNTER — Encounter: Payer: Self-pay | Admitting: Cardiovascular Disease

## 2013-12-28 DIAGNOSIS — M79605 Pain in left leg: Secondary | ICD-10-CM

## 2013-12-28 DIAGNOSIS — M79604 Pain in right leg: Secondary | ICD-10-CM | POA: Insufficient documentation

## 2013-12-28 NOTE — Assessment & Plan Note (Signed)
Her leg symptoms are atypical and I don't think they represent intermittent claudication. She does however have many risk factors for peripheral arterial disease and has established plaque in her renal arteries. The scheduler for a duplex ultrasound.

## 2013-12-28 NOTE — Assessment & Plan Note (Signed)
Time for repeat evaluation. She is on 4 separate agents for blood pressure control

## 2013-12-28 NOTE — Progress Notes (Signed)
Patient ID: Donna May, female   DOB: 1940-11-23, 73 y.o.   MRN: [id number]     Reason for office visit Establish new cardiology followup  Mrs. Tarpinian was a patient of Dr. Terance Ice for over 15 years. She is here to establish new cardiology followup after his retirement. She has a long-standing history of systemic hypertension, nonobstructive coronary atherosclerosis by catheterization in 2006 (normal nuclear stress test 2011), type 2 diabetes mellitus and hyperlipidemia. She has a strong family history of both peripheral and coronary artery disease she has mild to moderate moderate bilateral renal artery stenosis by duplex ultrasonography. Renal function parameters are normal.  She describes cramping in her thighs and calfs that occurs when she walks but also sometimes at rest. I'm not sure that she is really describing intermittent claudication. She has not had an evaluation of her lower extremity arterial system since 2006, when she had normal bilateral ABI and no significant obstructive lesions.  She denies any other cardiovascular complaints. Her systolic blood pressure is consistently around the low 140s.  Her electrocardiogram shows normal sinus rhythm without repolarization abnormalities.  No Known Allergies  Current Outpatient Prescriptions  Medication Sig Dispense Refill  . amLODipine (NORVASC) 10 MG tablet Take 1 tablet (10 mg total) by mouth daily.  90 tablet  3  . aspirin 81 MG tablet Take 81 mg by mouth daily.      Marland Kitchen CALCIUM-MAGNESIUM-VITAMIN D PO Take 1 tablet by mouth daily.      . Cholecalciferol (VITAMIN D3) 2000 UNITS TABS Take 1 tablet by mouth daily.      . CRESTOR 20 MG tablet Take 20 mg by mouth daily.       . Echinacea 400 MG CAPS Take 1 capsule by mouth daily.      Marland Kitchen EVISTA 60 MG tablet Take 60 mg by mouth daily.       . Glucosamine-Chondroit-Vit C-Mn (GLUCOSAMINE CHONDR 1500 COMPLX PO) Take 1 tablet by mouth daily.      Marland Kitchen JANUVIA 100 MG tablet Take 100  mg by mouth daily.       Marland Kitchen ketoconazole (NIZORAL) 2 % cream       . lansoprazole (PREVACID) 30 MG capsule Take 1 capsule (30 mg total) by mouth daily before breakfast.  90 capsule  3  . meloxicam (MOBIC) 15 MG tablet Take 1 tablet (15 mg total) by mouth daily.  30 tablet  1  . metFORMIN (GLUCOPHAGE-XR) 500 MG 24 hr tablet Take 500 mg by mouth 2 (two) times daily before a meal.       . metoprolol succinate (TOPROL-XL) 50 MG 24 hr tablet Take 1 1/2 tablets daily  135 tablet  3  . Multiple Vitamins-Minerals (CENTRUM SILVER ULTRA WOMENS) TABS Take 1 tablet by mouth daily.      Marland Kitchen triamcinolone cream (KENALOG) 0.1 %       . valsartan-hydrochlorothiazide (DIOVAN-HCT) 320-12.5 MG per tablet Take 1 tablet by mouth daily.  90 tablet  3   Current Facility-Administered Medications  Medication Dose Route Frequency Provider Last Rate Last Dose  . triamcinolone acetonide (KENALOG) 10 MG/ML injection 10 mg  10 mg Other Once Harriet Masson, DPM        Past Medical History  Diagnosis Date  . Arthritis   . Breast cancer 1992  . Diabetes mellitus   . Heart murmur   . Hypertension   . Hyperlipidemia   . Fainting spell   . UTI (lower urinary tract infection)   .  Duodenitis 01/18/2002  . GERD (gastroesophageal reflux disease)   . Hiatal hernia 08/08/2008, 01/18/2002  . History of pneumonia   . Anemia     Past Surgical History  Procedure Laterality Date  . Mastectomy  1992    right, with flap  . Tonsillectomy    . Axillary surgery      cyst removal, right    Family History  Problem Relation Age of Onset  . Breast cancer Cousin   . Diabetes Brother   . Heart disease Mother   . Heart disease Father   . Prostate cancer Brother     History   Social History  . Marital Status: Single    Spouse Name: N/A    Number of Children: 2  . Years of Education: N/A   Occupational History  . retired    Social History Main Topics  . Smoking status: Former Research scientist (life sciences)  . Smokeless tobacco: Never Used  .  Alcohol Use: Yes     Comment: ocass  . Drug Use: No  . Sexual Activity: Not on file   Other Topics Concern  . Not on file   Social History Narrative  . No narrative on file    Review of systems: The patient specifically denies any chest pain at rest or with exertion, dyspnea at rest or with exertion, orthopnea, paroxysmal nocturnal dyspnea, syncope, palpitations, focal neurological deficits,  lower extremity edema, unexplained weight gain, cough, hemoptysis or wheezing.  The patient also denies abdominal pain, nausea, vomiting, dysphagia, diarrhea, constipation, polyuria, polydipsia, dysuria, hematuria, frequency, urgency, abnormal bleeding or bruising, fever, chills, unexpected weight changes, mood swings, change in skin or hair texture, change in voice quality, auditory or visual problems, allergic reactions or rashes, new musculoskeletal complaints other than usual "aches and pains".   PHYSICAL EXAM BP 148/72  Pulse 59  Resp 20  Ht 5\' 5"  (1.651 m)  Wt 77.928 kg (171 lb 12.8 oz)  BMI 28.59 kg/m2  General: Alert, oriented x3, no distress Head: no evidence of trauma, PERRL, EOMI, no exophtalmos or lid lag, no myxedema, no xanthelasma; normal ears, nose and oropharynx Neck: normal jugular venous pulsations and no hepatojugular reflux; brisk carotid pulses without delay and no carotid bruits Chest: clear to auscultation, no signs of consolidation by percussion or palpation, normal fremitus, symmetrical and full respiratory excursions Cardiovascular: normal position and quality of the apical impulse, regular rhythm, normal first and second heart sounds, no murmurs, rubs or gallops Abdomen: no tenderness or distention, no masses by palpation, no abnormal pulsatility or arterial bruits, normal bowel sounds, no hepatosplenomegaly Extremities: no clubbing, cyanosis or edema; 2+ radial, ulnar and brachial pulses bilaterally; 2+ right femoral, 1+ posterior tibial and dorsalis pedis pulses; 2+  left femoral, 1+ posterior tibial and dorsalis pedis pulses; no subclavian or femoral bruits Neurological: grossly nonfocal   EKG: NSR  Lipid Panel     Component Value Date/Time   LDLCALC 72 03/16/2013    BMET    Component Value Date/Time   NA 141 12/01/2012 1105   K 4.1 12/01/2012 1105   CL 105 12/01/2012 1105   CO2 29 12/01/2012 1105   GLUCOSE 90 12/01/2012 1105   BUN 16 12/01/2012 1105   CREATININE 1.05 12/01/2012 1105   CREATININE 1.1 01/26/2009 1824   CALCIUM 10.0 12/01/2012 1105     ASSESSMENT AND PLAN Renal artery stenosis, native, bilateral Time for repeat evaluation. She is on 4 separate agents for blood pressure control  Essential hypertension Slightly elevated systolic  blood pressure. We'll increase the total dose of valsartan to 320 mg (replace her 2 separate prescriptions for valsartan 80 and valsartan/HCTZ 160/12.5 with a single prescription for valsartan/HCTZ 320/12.5)  HYPERCHOLESTEROLEMIA Excellent recent LDL cholesterol level. I don't have the results of her lipid profile this year, but in 2014 total cholesterol is 118, temperature 37, HDL 49, direct LDL 54, all excellent. Her hemoglobin A1c shows good glycemic control  Leg pain, bilateral Her leg symptoms are atypical and I don't think they represent intermittent claudication. She does however have many risk factors for peripheral arterial disease and has established plaque in her renal arteries. The scheduler for a duplex ultrasound.   Patient Instructions  STOP Valsartan 80mg  and Valsartan HCT 160/12.5mg  tablets (when your new prescription arrives from [facility].  START Valsartan HCTZ 320/12.5mg  daily.  Your physician has requested that you have a renal artery duplex. During this test, an ultrasound is used to evaluate blood flow to the kidneys. Allow one hour for this exam. Do not eat after midnight the day before and avoid carbonated beverages. Take your medications as you usually do.  Your physician  has requested that you have a lower extremity arterial exercise duplex. During this test, exercise and ultrasound are used to evaluate arterial blood flow in the legs. Allow one hour for this exam. There are no restrictions or special instructions.  Dr. Sallyanne Kuster recommends that you schedule a follow-up appointment in:  One year.      Orders Placed This Encounter  Procedures  . EKG 12-Lead  . Lower Extremity Arterial Duplex  . Renal Artery Duplex   Meds ordered this encounter  Medications  . ketoconazole (NIZORAL) 2 % cream    Sig:   . triamcinolone cream (KENALOG) 0.1 %    Sig:   . valsartan-hydrochlorothiazide (DIOVAN-HCT) 320-12.5 MG per tablet    Sig: Take 1 tablet by mouth daily.    Dispense:  90 tablet    Refill:  3  . amLODipine (NORVASC) 10 MG tablet    Sig: Take 1 tablet (10 mg total) by mouth daily.    Dispense:  90 tablet    Refill:  3    Please note that Dr. Gwenlyn Found will be Ms. Bredehoeft' new cardiologist.    Holli Humbles, MD, [facility] [phone] office [phone] pager

## 2013-12-28 NOTE — Assessment & Plan Note (Signed)
Excellent recent LDL cholesterol level. I don't have the results of her lipid profile this year, but in 2014 total cholesterol is 118, temperature 37, HDL 49, direct LDL 54, all excellent. Her hemoglobin A1c shows good glycemic control

## 2013-12-28 NOTE — Assessment & Plan Note (Signed)
Slightly elevated systolic blood pressure. We'll increase the total dose of valsartan to 320 mg (replace her 2 separate prescriptions for valsartan 80 and valsartan/HCTZ 160/12.5 with a single prescription for valsartan/HCTZ 320/12.5)

## 2014-01-04 ENCOUNTER — Telehealth (HOSPITAL_COMMUNITY): Payer: Self-pay | Admitting: *Deleted

## 2014-01-08 DIAGNOSIS — E784 Other hyperlipidemia: Secondary | ICD-10-CM | POA: Diagnosis not present

## 2014-01-08 DIAGNOSIS — Z779 Other contact with and (suspected) exposures hazardous to health: Secondary | ICD-10-CM | POA: Diagnosis not present

## 2014-01-08 DIAGNOSIS — E559 Vitamin D deficiency, unspecified: Secondary | ICD-10-CM | POA: Diagnosis not present

## 2014-01-08 DIAGNOSIS — Z9289 Personal history of other medical treatment: Secondary | ICD-10-CM | POA: Diagnosis not present

## 2014-01-08 DIAGNOSIS — E119 Type 2 diabetes mellitus without complications: Secondary | ICD-10-CM | POA: Diagnosis not present

## 2014-01-08 DIAGNOSIS — Z01419 Encounter for gynecological examination (general) (routine) without abnormal findings: Secondary | ICD-10-CM | POA: Diagnosis not present

## 2014-01-08 LAB — BASIC METABOLIC PANEL
BUN: 17 mg/dL (ref 4–21)
CREATININE: 1.3 mg/dL — AB (ref 0.5–1.1)
GLUCOSE: 89 mg/dL
Potassium: 4.1 mmol/L (ref 3.4–5.3)
Sodium: 142 mmol/L (ref 137–147)

## 2014-01-08 LAB — HEPATIC FUNCTION PANEL
ALK PHOS: 48 U/L (ref 25–125)
ALT: 11 U/L (ref 7–35)
AST: 15 U/L (ref 13–35)
BILIRUBIN, TOTAL: 0.3 mg/dL

## 2014-01-08 LAB — TSH: TSH: 1.18 u[IU]/mL (ref 0.41–5.90)

## 2014-01-08 LAB — LIPID PANEL
Cholesterol: 157 mg/dL (ref 0–200)
HDL: 66 mg/dL (ref 35–70)
LDL CALC: 83 mg/dL
LDl/HDL Ratio: 1.3
Triglycerides: 38 mg/dL — AB (ref 40–160)

## 2014-01-08 LAB — HEMOGLOBIN A1C: Hgb A1c MFr Bld: 6.7 % — AB (ref 4.0–6.0)

## 2014-01-09 ENCOUNTER — Encounter: Payer: Self-pay | Admitting: Internal Medicine

## 2014-01-12 ENCOUNTER — Ambulatory Visit (INDEPENDENT_AMBULATORY_CARE_PROVIDER_SITE_OTHER): Payer: Medicare Other | Admitting: *Deleted

## 2014-01-12 DIAGNOSIS — I10 Essential (primary) hypertension: Secondary | ICD-10-CM | POA: Diagnosis not present

## 2014-01-12 DIAGNOSIS — M8589 Other specified disorders of bone density and structure, multiple sites: Secondary | ICD-10-CM | POA: Diagnosis not present

## 2014-01-12 DIAGNOSIS — Z23 Encounter for immunization: Secondary | ICD-10-CM

## 2014-01-12 DIAGNOSIS — E559 Vitamin D deficiency, unspecified: Secondary | ICD-10-CM | POA: Diagnosis not present

## 2014-01-12 DIAGNOSIS — E780 Pure hypercholesterolemia: Secondary | ICD-10-CM | POA: Diagnosis not present

## 2014-01-12 DIAGNOSIS — E119 Type 2 diabetes mellitus without complications: Secondary | ICD-10-CM | POA: Diagnosis not present

## 2014-01-15 ENCOUNTER — Ambulatory Visit (INDEPENDENT_AMBULATORY_CARE_PROVIDER_SITE_OTHER): Payer: Medicare Other | Admitting: Internal Medicine

## 2014-01-15 ENCOUNTER — Encounter: Payer: Self-pay | Admitting: Internal Medicine

## 2014-01-15 VITALS — BP 158/90 | HR 70 | Temp 98.0°F | Resp 20 | Ht 65.0 in | Wt 170.0 lb

## 2014-01-15 DIAGNOSIS — E119 Type 2 diabetes mellitus without complications: Secondary | ICD-10-CM | POA: Diagnosis not present

## 2014-01-15 DIAGNOSIS — I701 Atherosclerosis of renal artery: Secondary | ICD-10-CM | POA: Diagnosis not present

## 2014-01-15 DIAGNOSIS — E785 Hyperlipidemia, unspecified: Secondary | ICD-10-CM | POA: Diagnosis not present

## 2014-01-15 DIAGNOSIS — I10 Essential (primary) hypertension: Secondary | ICD-10-CM

## 2014-01-15 NOTE — Progress Notes (Signed)
   Subjective:    Patient ID: Donna May, female    DOB: [birth year], 73 y.o.   MRN: [id number]  HPI    Review of Systems     Objective:   Physical Exam        Assessment & Plan:

## 2014-01-15 NOTE — Patient Instructions (Signed)
Limit your sodium (Salt) intake  Please check your blood pressure on a regular basis.  If it is consistently greater than 150/90, please make an office appointment.    It is important that you exercise regularly, at least 20 minutes 3 to 4 times per week.  If you develop chest pain or shortness of breath seek  medical attention.  Return in 3 months for follow-up

## 2014-01-15 NOTE — Progress Notes (Signed)
Subjective:    Patient ID: Donna May, female    DOB: 03-11-1941, 73 y.o.   MRN: [id number]  HPI  BP Readings from Last 3 Encounters:  01/15/14 158/90  12/27/13 148/72  12/19/13 69/6   73 year old patient who is seen today for follow-up of her hypertension.  She was seen by cardiology recently and her multi drug regimen was uptitrated slightly.  She generally feels well.  She is followed by endocrinology for follow-up of type 2 diabetes.  No cardiopulmonary complaints.  She has been scheduled for renal duplex and the screen for PAD. She does monitor home blood pressure readings with occasional systolic readings.  Recent laboratory studies were performed by endocrinology.  Creatinine slightly elevated at 1.29.  Hemoglobin A1c 6.7.  Total cholesterol 157 with a LDL of 83.  Cardiology notes and laboratory studies reviewed  Past Medical History  Diagnosis Date  . Arthritis   . Breast cancer 1992  . Diabetes mellitus   . Heart murmur   . Hypertension   . Hyperlipidemia   . Fainting spell   . UTI (lower urinary tract infection)   . Duodenitis 01/18/2002  . GERD (gastroesophageal reflux disease)   . Hiatal hernia 08/08/2008, 01/18/2002  . History of pneumonia   . Anemia     History   Social History  . Marital Status: Single    Spouse Name: N/A    Number of Children: 2  . Years of Education: N/A   Occupational History  . retired    Social History Main Topics  . Smoking status: Former Research scientist (life sciences)  . Smokeless tobacco: Never Used  . Alcohol Use: Yes     Comment: ocass  . Drug Use: No  . Sexual Activity: Not on file   Other Topics Concern  . Not on file   Social History Narrative    Past Surgical History  Procedure Laterality Date  . Mastectomy  1992    right, with flap  . Tonsillectomy    . Axillary surgery      cyst removal, right    Family History  Problem Relation Age of Onset  . Breast cancer Cousin   . Diabetes Brother   . Heart disease Mother   .  Heart disease Father   . Prostate cancer Brother     No Known Allergies  Current Outpatient Prescriptions on File Prior to Visit  Medication Sig Dispense Refill  . amLODipine (NORVASC) 10 MG tablet Take 1 tablet (10 mg total) by mouth daily. 90 tablet 3  . aspirin 81 MG tablet Take 81 mg by mouth daily.    Marland Kitchen CALCIUM-MAGNESIUM-VITAMIN D PO Take 1 tablet by mouth daily.    . Cholecalciferol (VITAMIN D3) 2000 UNITS TABS Take 1 tablet by mouth daily.    . CRESTOR 20 MG tablet Take 20 mg by mouth daily.     . Echinacea 400 MG CAPS Take 1 capsule by mouth daily.    Marland Kitchen EVISTA 60 MG tablet Take 60 mg by mouth daily.     . Glucosamine-Chondroit-Vit C-Mn (GLUCOSAMINE CHONDR 1500 COMPLX PO) Take 1 tablet by mouth daily.    Marland Kitchen JANUVIA 100 MG tablet Take 100 mg by mouth daily.     Marland Kitchen ketoconazole (NIZORAL) 2 % cream     . lansoprazole (PREVACID) 30 MG capsule Take 1 capsule (30 mg total) by mouth daily before breakfast. 90 capsule 3  . meloxicam (MOBIC) 15 MG tablet Take 1 tablet (15 mg total) by mouth  daily. 30 tablet 1  . metFORMIN (GLUCOPHAGE-XR) 500 MG 24 hr tablet Take 500 mg by mouth 2 (two) times daily before a meal.     . metoprolol succinate (TOPROL-XL) 50 MG 24 hr tablet Take 1 1/2 tablets daily 135 tablet 3  . Multiple Vitamins-Minerals (CENTRUM SILVER ULTRA WOMENS) TABS Take 1 tablet by mouth daily.    Marland Kitchen triamcinolone cream (KENALOG) 0.1 %     . valsartan-hydrochlorothiazide (DIOVAN-HCT) 320-12.5 MG per tablet Take 1 tablet by mouth daily. 90 tablet 3   Current Facility-Administered Medications on File Prior to Visit  Medication Dose Route Frequency Provider Last Rate Last Dose  . triamcinolone acetonide (KENALOG) 10 MG/ML injection 10 mg  10 mg Other Once [employer], DPM        BP 158/90 mmHg  Pulse 70  Temp(Src) 98 F (36.7 C) (Oral)  Resp 20  Ht 5\' 5"  (1.651 m)  Wt 170 lb (77.111 kg)  BMI 28.29 kg/m2  SpO2 95%     Review of Systems  Constitutional: Negative.   HENT:  Negative for congestion, dental problem, hearing loss, rhinorrhea, sinus pressure, sore throat and tinnitus.   Eyes: Negative for pain, discharge and visual disturbance.  Respiratory: Negative for cough and shortness of breath.   Cardiovascular: Negative for chest pain, palpitations and leg swelling.  Gastrointestinal: Negative for nausea, vomiting, abdominal pain, diarrhea, constipation, blood in stool and abdominal distention.  Genitourinary: Negative for dysuria, urgency, frequency, hematuria, flank pain, vaginal bleeding, vaginal discharge, difficulty urinating, vaginal pain and pelvic pain.  Musculoskeletal: Negative for joint swelling, arthralgias and gait problem.  Skin: Negative for rash.  Neurological: Negative for dizziness, syncope, speech difficulty, weakness, numbness and headaches.  Hematological: Negative for adenopathy.  Psychiatric/Behavioral: Negative for behavioral problems, dysphoric mood and agitation. The patient is not nervous/anxious.        Objective:   Physical Exam  Constitutional: She is oriented to person, place, and time. She appears well-developed and well-nourished.  Blood pressure 160/72  HENT:  Head: Normocephalic.  Right Ear: External ear normal.  Left Ear: External ear normal.  Mouth/Throat: Oropharynx is clear and moist.  Eyes: Conjunctivae are normal. Pupils are equal, round, and reactive to light.  Neck: Normal range of motion. Neck supple. No thyromegaly present.  Cardiovascular: Normal rate, regular rhythm and normal heart sounds.   Pulmonary/Chest: Effort normal and breath sounds normal.  Abdominal: She exhibits no mass. There is no tenderness.  Musculoskeletal: Normal range of motion.  Lymphadenopathy:    She has no cervical adenopathy.  Neurological: She is alert and oriented to person, place, and time.  Skin: Skin is warm and dry. No rash noted.  Psychiatric: She has a normal mood and affect. Her behavior is normal.            Assessment & Plan:   Hypertension.  Blood pressure slightly labile on a multidrug regimen.  Low-salt diet, exercise, modest weight loss.  All encouraged.  No change in her blood pressure regimen.  We'll recheck in 3 weeks Diabetes mellitus.  Followed in endocrinology Dyslipidemia.  Recent lipid panel at goal History of bilateral renal artery stenosis.  Follow-up renal duplex ordered History nonspecific leg pain.  Lower extremity arterial Doppler studies pending  Recheck 3 months

## 2014-01-15 NOTE — Progress Notes (Signed)
Pre visit review using our clinic review tool, if applicable. No additional management support is needed unless otherwise documented below in the visit note. 

## 2014-01-17 ENCOUNTER — Encounter: Payer: Self-pay | Admitting: Cardiovascular Disease

## 2014-01-31 DIAGNOSIS — Z780 Asymptomatic menopausal state: Secondary | ICD-10-CM | POA: Diagnosis not present

## 2014-01-31 DIAGNOSIS — Z853 Personal history of malignant neoplasm of breast: Secondary | ICD-10-CM | POA: Diagnosis not present

## 2014-01-31 LAB — HM DEXA SCAN

## 2014-02-07 ENCOUNTER — Ambulatory Visit (HOSPITAL_COMMUNITY)
Admission: RE | Admit: 2014-02-07 | Discharge: 2014-02-07 | Disposition: A | Discharge status: Home / Self Care | Payer: Medicare Other | Source: Ambulatory Visit | Attending: Internal Medicine | Admitting: Internal Medicine

## 2014-02-07 ENCOUNTER — Ambulatory Visit (HOSPITAL_COMMUNITY)
Admission: RE | Admit: 2014-02-07 | Discharge: 2014-02-07 | Disposition: A | Discharge status: Home / Self Care | Payer: Medicare Other | Source: Ambulatory Visit | Attending: Cardiovascular Disease | Admitting: Cardiovascular Disease

## 2014-02-07 DIAGNOSIS — I701 Atherosclerosis of renal artery: Secondary | ICD-10-CM | POA: Diagnosis not present

## 2014-02-07 DIAGNOSIS — I739 Peripheral vascular disease, unspecified: Secondary | ICD-10-CM | POA: Insufficient documentation

## 2014-02-07 NOTE — Progress Notes (Signed)
Arterial Lower Ext. Duplex Completed. Greg Cratty, BS, RDMS, RVT  

## 2014-02-07 NOTE — Progress Notes (Signed)
Renal Artery Duplex Completed. °Brianna L Mazza,RVT °

## 2014-02-14 ENCOUNTER — Encounter: Payer: Self-pay | Admitting: Internal Medicine

## 2014-02-20 ENCOUNTER — Encounter: Payer: Self-pay | Admitting: Internal Medicine

## 2014-03-07 ENCOUNTER — Encounter: Payer: Self-pay | Admitting: Internal Medicine

## 2014-03-12 ENCOUNTER — Other Ambulatory Visit: Payer: Self-pay

## 2014-03-20 ENCOUNTER — Emergency Department (INDEPENDENT_AMBULATORY_CARE_PROVIDER_SITE_OTHER)
Admission: EM | Admit: 2014-03-20 | Discharge: 2014-03-20 | Disposition: A | Discharge status: Home / Self Care | Payer: Medicare Other | Source: Home / Self Care | Attending: Emergency Medicine | Admitting: Emergency Medicine

## 2014-03-20 ENCOUNTER — Encounter (HOSPITAL_COMMUNITY): Payer: Self-pay | Admitting: Emergency Medicine

## 2014-03-20 DIAGNOSIS — J9801 Acute bronchospasm: Secondary | ICD-10-CM

## 2014-03-20 DIAGNOSIS — R05 Cough: Secondary | ICD-10-CM

## 2014-03-20 DIAGNOSIS — J069 Acute upper respiratory infection, unspecified: Secondary | ICD-10-CM | POA: Diagnosis not present

## 2014-03-20 DIAGNOSIS — R0982 Postnasal drip: Secondary | ICD-10-CM

## 2014-03-20 DIAGNOSIS — R059 Cough, unspecified: Secondary | ICD-10-CM

## 2014-03-20 MED ORDER — AMOXICILLIN-POT CLAVULANATE 875-125 MG PO TABS: 1.0000 | ORAL_TABLET | Freq: Two times a day (BID) | ORAL | Status: DC

## 2014-03-20 MED ORDER — GUAIFENESIN-CODEINE 100-10 MG/5ML PO SYRP: 5.0000 mL | ORAL_SOLUTION | ORAL | Status: DC | PRN

## 2014-03-20 MED ORDER — PREDNISONE 20 MG PO TABS: ORAL_TABLET | ORAL | Status: DC

## 2014-03-20 MED ORDER — ALBUTEROL SULFATE HFA 108 (90 BASE) MCG/ACT IN AERS: 2.0000 | INHALATION_SPRAY | RESPIRATORY_TRACT | Status: DC | PRN

## 2014-03-20 NOTE — ED Provider Notes (Signed)
CSN: [id number]     Arrival date & time 03/20/14  [id number] History   First MD Initiated Contact with Patient 03/20/14 1843     Chief Complaint  Patient presents with  . Cough   (Consider location/radiation/quality/duration/timing/severity/associated sxs/prior Treatment) HPI Comments: 74 year old female complaining of a cough for several days. Associated with large amount of PND and nasal congestion. Denies shortness of breath or fever.   Past Medical History  Diagnosis Date  . Arthritis   . Breast cancer 1992  . Diabetes mellitus   . Heart murmur   . Hypertension   . Hyperlipidemia   . Fainting spell   . UTI (lower urinary tract infection)   . Duodenitis 01/18/2002  . GERD (gastroesophageal reflux disease)   . Hiatal hernia 08/08/2008, 01/18/2002  . History of pneumonia   . Anemia    Past Surgical History  Procedure Laterality Date  . Mastectomy  1992    right, with flap  . Tonsillectomy    . Axillary surgery      cyst removal, right   Family History  Problem Relation Age of Onset  . Breast cancer Cousin   . Diabetes Brother   . Heart disease Mother   . Heart disease Father   . Prostate cancer Brother    History  Substance Use Topics  . Smoking status: Former Research scientist (life sciences)  . Smokeless tobacco: Never Used  . Alcohol Use: Yes     Comment: ocass   OB History    No data available     Review of Systems  Constitutional: Positive for activity change. Negative for fever.  HENT: Positive for congestion, postnasal drip and rhinorrhea. Negative for ear pain and sore throat.   Respiratory: Positive for cough and wheezing. Negative for shortness of breath.   Cardiovascular: Negative for chest pain and palpitations.  Gastrointestinal: Negative.     Allergies  Review of patient's allergies indicates no known allergies.  Home Medications   Prior to Admission medications   Medication Sig Start Date End Date Taking? Authorizing Provider  albuterol (PROVENTIL HFA;VENTOLIN HFA) 108  (90 BASE) MCG/ACT inhaler Inhale 2 puffs into the lungs every 4 (four) hours as needed for wheezing or shortness of breath. 03/20/14   Janne Napoleon, NP  amLODipine (NORVASC) 10 MG tablet Take 1 tablet (10 mg total) by mouth daily. 12/27/13   Mihai [name], MD  amoxicillin-clavulanate (AUGMENTIN) 875-125 MG per tablet Take 1 tablet by mouth every 12 (twelve) hours. 03/20/14   Janne Napoleon, NP  aspirin 81 MG tablet Take 81 mg by mouth daily.    Historical Provider, MD  CALCIUM-MAGNESIUM-VITAMIN D PO Take 1 tablet by mouth daily.    Historical Provider, MD  Cholecalciferol (VITAMIN D3) 2000 UNITS TABS Take 1 tablet by mouth daily.    Historical Provider, MD  CRESTOR 20 MG tablet Take 20 mg by mouth daily.  05/18/11   Historical Provider, MD  Echinacea 400 MG CAPS Take 1 capsule by mouth daily.    Historical Provider, MD  EVISTA 60 MG tablet Take 60 mg by mouth daily.  05/20/11   Historical Provider, MD  Glucosamine-Chondroit-Vit C-Mn (GLUCOSAMINE CHONDR 1500 COMPLX PO) Take 1 tablet by mouth daily.    Historical Provider, MD  guaiFENesin-codeine (CHERATUSSIN AC) 100-10 MG/5ML syrup Take 5 mLs by mouth every 4 (four) hours as needed for cough or congestion. 03/20/14   Janne Napoleon, NP  JANUVIA 100 MG tablet Take 100 mg by mouth daily.  05/20/11   Historical Provider, MD  ketoconazole (NIZORAL) 2 % cream  10/30/13   Historical Provider, MD  lansoprazole (PREVACID) 30 MG capsule Take 1 capsule (30 mg total) by mouth daily before breakfast. 10/31/13   Inda Castle, MD  meloxicam (MOBIC) 15 MG tablet Take 1 tablet (15 mg total) by mouth daily. 12/01/13   Harriet Masson, DPM  metFORMIN (GLUCOPHAGE-XR) 500 MG 24 hr tablet Take 500 mg by mouth 2 (two) times daily before a meal.  06/01/11   Historical Provider, MD  metoprolol succinate (TOPROL-XL) 50 MG 24 hr tablet Take 1 1/2 tablets daily 06/02/13   Mihai [name], MD  Multiple Vitamins-Minerals (CENTRUM SILVER ULTRA WOMENS) TABS Take 1 tablet by mouth daily.    Historical  Provider, MD  predniSONE (DELTASONE) 20 MG tablet Take 2 tablets for 2 days, then 1 tablet for 3 days Take with food. 03/20/14   Janne Napoleon, NP  triamcinolone cream (KENALOG) 0.1 %  10/16/13   Historical Provider, MD  valsartan-hydrochlorothiazide (DIOVAN-HCT) 320-12.5 MG per tablet Take 1 tablet by mouth daily. 12/27/13   Mihai [name], MD   BP 190/80 mmHg  Pulse 79  Temp(Src) 98.7 F (37.1 C) (Oral)  Resp 22  SpO2 100% Physical Exam  Constitutional: She is oriented to person, place, and time. She appears well-developed and well-nourished. No distress.  HENT:  Bilateral TMs are normal Oropharynx with thick gray PND. No erythema or exudates.  Eyes: Conjunctivae and EOM are normal.  Neck: Normal range of motion. Neck supple.  Cardiovascular: Normal rate, regular rhythm and normal heart sounds.   Pulmonary/Chest: Effort normal. No respiratory distress.  Forced expiration and cough reveals occasional, faint and expiratory coarseness/wheeze. Good air movement.  Musculoskeletal: Normal range of motion. She exhibits no edema.  Lymphadenopathy:    She has no cervical adenopathy.  Neurological: She is alert and oriented to person, place, and time.  Skin: Skin is warm and dry. No rash noted.  Psychiatric: She has a normal mood and affect.  Nursing note and vitals reviewed.   ED Course  Procedures (including critical care time) Labs Review Labs Reviewed - No data to display  Imaging Review No results found.   MDM   1. Cough   2. PND (post-nasal drip)   3. URI (upper respiratory infection)   4. Bronchospasm    For drainage Chlor-Trimeton 2 to 4 mg every 6 hours as needed Lots of nasal saline spray frequently Cheratussin AC for cough Low dose prednisone Albuterol HFA for cough and wheeze augmentin as dir    Janne Napoleon, NP 03/20/14 1919

## 2014-03-20 NOTE — ED Notes (Signed)
C/o cough and coughing up green phlegm, denies fever.  Onset of symptoms 12/30

## 2014-03-20 NOTE — Discharge Instructions (Signed)
Bronchospasm A bronchospasm is when the tubes that carry air in and out of your lungs (airways) spasm or tighten. During a bronchospasm it is hard to breathe. This is because the airways get smaller. A bronchospasm can be triggered by: 1. Allergies. These may be to animals, pollen, food, or mold. 2. Infection. This is a common cause of bronchospasm. 3. Exercise. 4. Irritants. These include pollution, cigarette smoke, strong odors, aerosol sprays, and paint fumes. 5. Weather changes. 6. Stress. 7. Being emotional. HOME CARE   Always have a plan for getting help. Know when to call your doctor and local emergency services (911 in the [place] Know where you can get emergency care.  Only take medicines as told by your doctor.  If you were prescribed an inhaler or nebulizer machine, ask your doctor how to use it correctly. Always use a spacer with your inhaler if you were given one.  Stay calm during an attack. Try to relax and breathe more slowly.  Control your home environment:  Change your heating and air conditioning filter at least once a month.  Limit your use of fireplaces and wood stoves.  Do not  smoke. Do not  allow smoking in your home.  Avoid perfumes and fragrances.  Get rid of pests (such as roaches and mice) and their droppings.  Throw away plants if you see mold on them.  Keep your house clean and dust free.  Replace carpet with wood, tile, or vinyl flooring. Carpet can trap dander and dust.  Use allergy-proof pillows, mattress covers, and box spring covers.  Wash bed sheets and blankets every week in hot water. Dry them in a dryer.  Use blankets that are made of polyester or cotton.  Wash hands frequently. GET HELP IF:  You have muscle aches.  You have chest pain.  The thick spit you spit or cough up (sputum) changes from clear or white to yellow, green, gray, or bloody.  The thick spit you spit or cough up gets thicker.  There are problems that may be  related to the medicine you are given such as:  A rash.  Itching.  Swelling.  Trouble breathing. GET HELP RIGHT AWAY IF:  You feel you cannot breathe or catch your breath.  You cannot stop coughing.  Your treatment is not helping you breathe better.  You have very bad chest pain. MAKE SURE YOU:   Understand these instructions.  Will watch your condition.  Will get help right away if you are not doing well or get worse. Document Released: 12/28/2008 Document Revised: 03/07/2013 Document Reviewed: 08/23/2012 [facility] Patient Information 2015 [place], [place]. This information is not intended to replace advice given to you by your health care provider. Make sure you discuss any questions you have with your health care provider.  Cough, Adult  A cough is a reflex that helps clear your throat and airways. It can help heal the body or may be a reaction to an irritated airway. A cough may only last 2 or 3 weeks (acute) or may last more than 8 weeks (chronic).  CAUSES Acute cough: 8. Viral or bacterial infections. Chronic cough:  Infections.  Allergies.  Asthma.  Post-nasal drip.  Smoking.  Heartburn or acid reflux.  Some medicines.  Chronic lung problems (COPD).  Cancer. SYMPTOMS   Cough.  Fever.  Chest pain.  Increased breathing rate.  High-pitched whistling sound when breathing (wheezing).  Colored mucus that you cough up (sputum). TREATMENT   A bacterial cough  may be treated with antibiotic medicine.  A viral cough must run its course and will not respond to antibiotics.  Your caregiver may recommend other treatments if you have a chronic cough. HOME CARE INSTRUCTIONS   Only take over-the-counter or prescription medicines for pain, discomfort, or fever as directed by your caregiver. Use cough suppressants only as directed by your caregiver.  Use a cold steam vaporizer or humidifier in your bedroom or home to help loosen secretions.  Sleep in a  semi-upright position if your cough is worse at night.  Rest as needed.  Stop smoking if you smoke. SEEK IMMEDIATE MEDICAL CARE IF:   You have pus in your sputum.  Your cough starts to worsen.  You cannot control your cough with suppressants and are losing sleep.  You begin coughing up blood.  You have difficulty breathing.  You develop pain which is getting worse or is uncontrolled with medicine.  You have a fever. MAKE SURE YOU:   Understand these instructions.  Will watch your condition.  Will get help right away if you are not doing well or get worse. Document Released: 08/29/2010 Document Revised: 05/25/2011 Document Reviewed: 08/29/2010 [facility] Patient Information 2015 [place], [place]. This information is not intended to replace advice given to you by your health care provider. Make sure you discuss any questions you have with your health care provider.  Cool Mist Vaporizers Vaporizers may help relieve the symptoms of a cough and cold. They add moisture to the air, which helps mucus to become thinner and less sticky. This makes it easier to breathe and cough up secretions. Cool mist vaporizers do not cause serious burns like hot mist vaporizers, which may also be called steamers or humidifiers. Vaporizers have not been proven to help with colds. You should not use a vaporizer if you are allergic to mold. HOME CARE INSTRUCTIONS 9. Follow the package instructions for the vaporizer. 10. Do not use anything other than distilled water in the vaporizer. 11. Do not run the vaporizer all of the time. This can cause mold or bacteria to grow in the vaporizer. 12. Clean the vaporizer after each time it is used. 13. Clean and dry the vaporizer well before storing it. 14. Stop using the vaporizer if worsening respiratory symptoms develop. Document Released: 11/28/2003 Document Revised: 03/07/2013 Document Reviewed: 07/20/2012 [facility] Patient Information 2015 [place], [place]. This  information is not intended to replace advice given to you by your health care provider. Make sure you discuss any questions you have with your health care provider.  Upper Respiratory Infection, Adult For drainage Chlor-Trimeton 2 to 4 mg every 6 hours as needed Lots of nasal saline spray frequently Cheratussin AC for cough An upper respiratory infection (URI) is also sometimes known as the common cold. The upper respiratory tract includes the nose, sinuses, throat, trachea, and bronchi. Bronchi are the airways leading to the lungs. Most people improve within 1 week, but symptoms can last up to 2 weeks. A residual cough may last even longer.  CAUSES Many different viruses can infect the tissues lining the upper respiratory tract. The tissues become irritated and inflamed and often become very moist. Mucus production is also common. A cold is contagious. You can easily spread the virus to others by oral contact. This includes kissing, sharing a glass, coughing, or sneezing. Touching your mouth or nose and then touching a surface, which is then touched by another person, can also spread the virus. SYMPTOMS  Symptoms typically develop 1 to  3 days after you come in contact with a cold virus. Symptoms vary from person to person. They may include: 15. Runny nose. 16. Sneezing. 17. Nasal congestion. 18. Sinus irritation. 19. Sore throat. 20. Loss of voice (laryngitis). 21. Cough. 22. Fatigue. 23. Muscle aches. 24. Loss of appetite. 25. Headache. 26. Low-grade fever. DIAGNOSIS  You might diagnose your own cold based on familiar symptoms, since most people get a cold 2 to 3 times a year. Your caregiver can confirm this based on your exam. Most importantly, your caregiver can check that your symptoms are not due to another disease such as strep throat, sinusitis, pneumonia, asthma, or epiglottitis. Blood tests, throat tests, and X-rays are not necessary to diagnose a common cold, but they may  sometimes be helpful in excluding other more serious diseases. Your caregiver will decide if any further tests are required. RISKS AND COMPLICATIONS  You may be at risk for a more severe case of the common cold if you smoke cigarettes, have chronic heart disease (such as heart failure) or lung disease (such as asthma), or if you have a weakened immune system. The very young and very old are also at risk for more serious infections. Bacterial sinusitis, middle ear infections, and bacterial pneumonia can complicate the common cold. The common cold can worsen asthma and chronic obstructive pulmonary disease (COPD). Sometimes, these complications can require emergency medical care and may be life-threatening. PREVENTION  The best way to protect against getting a cold is to practice good hygiene. Avoid oral or hand contact with people with cold symptoms. Wash your hands often if contact occurs. There is no clear evidence that vitamin C, vitamin E, echinacea, or exercise reduces the chance of developing a cold. However, it is always recommended to get plenty of rest and practice good nutrition. TREATMENT  Treatment is directed at relieving symptoms. There is no cure. Antibiotics are not effective, because the infection is caused by a virus, not by bacteria. Treatment may include:  Increased fluid intake. Sports drinks offer valuable electrolytes, sugars, and fluids.  Breathing heated mist or steam (vaporizer or shower).  Eating chicken soup or other clear broths, and maintaining good nutrition.  Getting plenty of rest.  Using gargles or lozenges for comfort.  Controlling fevers with ibuprofen or acetaminophen as directed by your caregiver.  Increasing usage of your inhaler if you have asthma. Zinc gel and zinc lozenges, taken in the first 24 hours of the common cold, can shorten the duration and lessen the severity of symptoms. Pain medicines may help with fever, muscle aches, and throat pain. A  variety of non-prescription medicines are available to treat congestion and runny nose. Your caregiver can make recommendations and may suggest nasal or lung inhalers for other symptoms.  HOME CARE INSTRUCTIONS   Only take over-the-counter or prescription medicines for pain, discomfort, or fever as directed by your caregiver.  Use a warm mist humidifier or inhale steam from a shower to increase air moisture. This may keep secretions moist and make it easier to breathe.  Drink enough water and fluids to keep your urine clear or pale yellow.  Rest as needed.  Return to work when your temperature has returned to normal or as your caregiver advises. You may need to stay home longer to avoid infecting others. You can also use a face mask and careful hand washing to prevent spread of the virus. SEEK MEDICAL CARE IF:   After the first few days, you feel you are getting  worse rather than better.  You need your caregiver's advice about medicines to control symptoms.  You develop chills, worsening shortness of breath, or brown or red sputum. These may be signs of pneumonia.  You develop yellow or brown nasal discharge or pain in the face, especially when you bend forward. These may be signs of sinusitis.  You develop a fever, swollen neck glands, pain with swallowing, or white areas in the back of your throat. These may be signs of strep throat. SEEK IMMEDIATE MEDICAL CARE IF:   You have a fever.  You develop severe or persistent headache, ear pain, sinus pain, or chest pain.  You develop wheezing, a prolonged cough, cough up blood, or have a change in your usual mucus (if you have chronic lung disease).  You develop sore muscles or a stiff neck. Document Released: 08/26/2000 Document Revised: 05/25/2011 Document Reviewed: 06/07/2013 [facility] Patient Information 2015 [place], [place]. This information is not intended to replace advice given to you by your health care provider. Make sure you  discuss any questions you have with your health care provider.  How to Use an Inhaler Using your inhaler correctly is very important. Good technique will make sure that the medicine reaches your lungs.  HOW TO USE AN INHALER: 27. Take the cap off the inhaler. 28. If this is the first time using your inhaler, you need to prime it. Shake the inhaler for 5 seconds. Release four puffs into the air, away from your face. Ask your doctor for help if you have questions. 29. Shake the inhaler for 5 seconds. 30. Turn the inhaler so the bottle is above the mouthpiece. 31. Put your pointer finger on top of the bottle. Your thumb holds the bottom of the inhaler. 32. Open your mouth. 33. Either hold the inhaler away from your mouth (the width of 2 fingers) or place your lips tightly around the mouthpiece. Ask your doctor which way to use your inhaler. 34. Breathe out as much air as possible. 35. Breathe in and push down on the bottle 1 time to release the medicine. You will feel the medicine go in your mouth and throat. 36. Continue to take a deep breath in very slowly. Try to fill your lungs. 37. After you have breathed in completely, hold your breath for 10 seconds. This will help the medicine to settle in your lungs. If you cannot hold your breath for 10 seconds, hold it for as long as you can before you breathe out. 38. Breathe out slowly, through pursed lips. Whistling is an example of pursed lips. 39. If your doctor has told you to take more than 1 puff, wait at least 15-30 seconds between puffs. This will help you get the best results from your medicine. Do not use the inhaler more than your doctor tells you to. 40. Put the cap back on the inhaler. 41. Follow the directions from your doctor or from the inhaler package about cleaning the inhaler. If you use more than one inhaler, ask your doctor which inhalers to use and what order to use them in. Ask your doctor to help you figure out when you will need  to refill your inhaler.  If you use a steroid inhaler, always rinse your mouth with water after your last puff, gargle and spit out the water. Do not swallow the water. GET HELP IF:  The inhaler medicine only partially helps to stop wheezing or shortness of breath.  You are having trouble using your  inhaler.  You have some increase in thick spit (phlegm). GET HELP RIGHT AWAY IF:  The inhaler medicine does not help your wheezing or shortness of breath or you have tightness in your chest.  You have dizziness, headaches, or fast heart rate.  You have chills, fever, or night sweats.  You have a large increase of thick spit, or your thick spit is bloody. MAKE SURE YOU:   Understand these instructions.  Will watch your condition.  Will get help right away if you are not doing well or get worse. Document Released: 12/10/2007 Document Revised: 12/21/2012 Document Reviewed: 09/29/2012 [facility] Patient Information 2015 [place], [place]. This information is not intended to replace advice given to you by your health care provider. Make sure you discuss any questions you have with your health care provider.

## 2014-03-30 ENCOUNTER — Other Ambulatory Visit: Payer: Self-pay

## 2014-03-30 MED ORDER — ROSUVASTATIN CALCIUM 20 MG PO TABS: 20.0000 mg | ORAL_TABLET | Freq: Every day | ORAL | Status: DC

## 2014-03-30 NOTE — Telephone Encounter (Signed)
Rx sent to pharmacy   

## 2014-04-02 ENCOUNTER — Other Ambulatory Visit: Payer: Self-pay

## 2014-05-21 DIAGNOSIS — E119 Type 2 diabetes mellitus without complications: Secondary | ICD-10-CM | POA: Diagnosis not present

## 2014-05-21 DIAGNOSIS — E780 Pure hypercholesterolemia: Secondary | ICD-10-CM | POA: Diagnosis not present

## 2014-05-21 LAB — BASIC METABOLIC PANEL
BUN: 16 mg/dL (ref 4–21)
Creatinine: 1.2 mg/dL — AB (ref 0.5–1.1)
GLUCOSE: 100 mg/dL
Potassium: 4.3 mmol/L (ref 3.4–5.3)
Sodium: 143 mmol/L (ref 137–147)

## 2014-05-21 LAB — HEPATIC FUNCTION PANEL
ALK PHOS: 54 U/L (ref 25–125)
ALT: 13 U/L (ref 7–35)
AST: 20 U/L (ref 13–35)
Bilirubin, Total: 0.2 mg/dL

## 2014-05-21 LAB — HEMOGLOBIN A1C: HEMOGLOBIN A1C: 6.6 % — AB (ref 4.0–6.0)

## 2014-05-21 LAB — LIPID PANEL
Cholesterol: 148 mg/dL (ref 0–200)
HDL: 70 mg/dL (ref 35–70)
LDL Cholesterol: 69 mg/dL
LDl/HDL Ratio: 1
Triglycerides: 45 mg/dL (ref 40–160)

## 2014-05-25 DIAGNOSIS — E559 Vitamin D deficiency, unspecified: Secondary | ICD-10-CM | POA: Diagnosis not present

## 2014-05-25 DIAGNOSIS — I10 Essential (primary) hypertension: Secondary | ICD-10-CM | POA: Diagnosis not present

## 2014-05-25 DIAGNOSIS — E119 Type 2 diabetes mellitus without complications: Secondary | ICD-10-CM | POA: Diagnosis not present

## 2014-05-25 DIAGNOSIS — M8589 Other specified disorders of bone density and structure, multiple sites: Secondary | ICD-10-CM | POA: Diagnosis not present

## 2014-05-25 DIAGNOSIS — E780 Pure hypercholesterolemia: Secondary | ICD-10-CM | POA: Diagnosis not present

## 2014-06-19 ENCOUNTER — Encounter: Payer: Self-pay | Admitting: *Deleted

## 2014-06-26 ENCOUNTER — Encounter: Payer: Self-pay | Admitting: Internal Medicine

## 2014-09-03 ENCOUNTER — Other Ambulatory Visit: Payer: Self-pay | Admitting: Cardiovascular Disease

## 2014-09-03 NOTE — Telephone Encounter (Signed)
E sent to pharmacy 

## 2014-09-06 ENCOUNTER — Ambulatory Visit (INDEPENDENT_AMBULATORY_CARE_PROVIDER_SITE_OTHER): Payer: Medicare Other | Admitting: Internal Medicine

## 2014-09-06 ENCOUNTER — Encounter: Payer: Self-pay | Admitting: Internal Medicine

## 2014-09-06 VITALS — BP 140/80 | HR 56 | Temp 98.1°F | Resp 20 | Ht 64.5 in | Wt 166.0 lb

## 2014-09-06 DIAGNOSIS — Z0000 Encounter for general adult medical examination without abnormal findings: Secondary | ICD-10-CM | POA: Diagnosis not present

## 2014-09-06 DIAGNOSIS — I10 Essential (primary) hypertension: Secondary | ICD-10-CM

## 2014-09-06 DIAGNOSIS — E7800 Pure hypercholesterolemia, unspecified: Secondary | ICD-10-CM

## 2014-09-06 DIAGNOSIS — M129 Arthropathy, unspecified: Secondary | ICD-10-CM | POA: Diagnosis not present

## 2014-09-06 DIAGNOSIS — E0821 Diabetes mellitus due to underlying condition with diabetic nephropathy: Secondary | ICD-10-CM | POA: Diagnosis not present

## 2014-09-06 LAB — MICROALBUMIN / CREATININE URINE RATIO
CREATININE, U: 189.3 mg/dL
Microalb Creat Ratio: 0.8 mg/g (ref 0.0–30.0)
Microalb, Ur: 1.6 mg/dL (ref 0.0–1.9)

## 2014-09-06 LAB — HEMOGLOBIN A1C: Hgb A1c MFr Bld: 6.5 % (ref 4.6–6.5)

## 2014-09-06 NOTE — Progress Notes (Signed)
Subjective:    Patient ID: Donna May, female    DOB: Apr 03, 1940, 74 y.o.   MRN: [id number]  HPI   Subjective:    Patient ID: Donna May, female    DOB: 1940-07-02, 74 y.o.   MRN: [id number]  Diabetes Pertinent negatives for hypoglycemia include no dizziness, headaches, nervousness/anxiousness or speech difficulty. Pertinent negatives for diabetes include no chest pain and no weakness.  Hypertension Pertinent negatives include no chest pain, headaches, palpitations or shortness of breath.   74 year-old patient who is seen today for a wellness exam;    Medical problems include diabetes hypertension and dyslipidemia. She has remote history of right breast cancer. Consultants include cardiology, GYN, endocrinology (Alzheimer) and ophthalmology Gershon Crane)  She feels as she has had a colonoscopy 2010.  She did have a mammogram last year and her next eye exam is due in August of this year.  DEXA scan in February 15, 2014  Family history father died at 31 had a history of arthritis and senile dementia Mother died at age 60 her first MI at 36 status post CABG. She also had MS 2 brothers both with coronary artery disease one brother deceased also a history of diabetes.  Surgical history status post mastectomy in 1992;  has had a rectovaginal fistula repaired in 1970 remote tonsillectomy in 1958  Retired from the [employer] system   1. Risk factors, based on past  M,S,F history- cardiovascular risk factors include diabetes hypertension and dyslipidemia  2.  Physical activities: No exercise limitations but does have some bilateral knee pain 3.  Depression/mood: No history depression in her meds or her 4.  Hearing: No significant deficits  5.  ADL's: Independent in all aspects of daily living  6.  Fall risk: Low  7.  Home safety: No problems identified  8.  Height weight, and visual acuity;  9.  Counseling: Regular exercise restricted salt and calorie diet  encouraged  10. Lab orders based on risk factors:  We'll check hemoglobin A1c and urine for microalbumin  11. Referral : Followup with GYN cardiology endocrinology  12. Care plan: No change in regimen more exercise modest weight loss all encouraged 13. Cognitive assessment: Alert and oriented with normal affect no cognitive dysfunction  14.  Preventive services will include annual clinical examinations with screening lab.  She will have annual eye examinations and colonoscopies at 10 year intervals.  Annual mammograms.  We'll be continued.  In view of her history of breast cancer  15.  Provider list includes primary care medicine and ophthalmology, cardiology, endocrinology and OB/GYN    Review of Systems  Constitutional: Negative.   HENT: Negative for congestion, dental problem, hearing loss, rhinorrhea, sinus pressure, sore throat and tinnitus.   Eyes: Negative for pain, discharge and visual disturbance.  Respiratory: Negative for cough and shortness of breath.   Cardiovascular: Negative for chest pain, palpitations and leg swelling.  Gastrointestinal: Negative for nausea, vomiting, abdominal pain, diarrhea, constipation, blood in stool and abdominal distention.  Genitourinary: Negative for dysuria, urgency, frequency, hematuria, flank pain, vaginal bleeding, vaginal discharge, difficulty urinating, vaginal pain and pelvic pain.  Musculoskeletal: Positive for arthralgias (knee and shoulder discomfort). Negative for gait problem and joint swelling.  Skin: Negative for rash.  Neurological: Negative for dizziness, syncope, speech difficulty, weakness, numbness and headaches.  Hematological: Negative for adenopathy.  Psychiatric/Behavioral: Negative for behavioral problems, dysphoric mood and agitation. The patient is not nervous/anxious.        Objective:  Physical Exam  Constitutional: She is oriented to person, place, and time. She appears well-developed and well-nourished.  HENT:   Head: Normocephalic.  Right Ear: External ear normal.  Left Ear: External ear normal.  Mouth/Throat: Oropharynx is clear and moist.  Eyes: Conjunctivae and EOM are normal. Pupils are equal, round, and reactive to light.  Neck: Normal range of motion. Neck supple. No thyromegaly present.  Cardiovascular: Normal rate and regular rhythm.   Murmur heard. Grade 1- 2/6 systolic ejection murmur  The right dorsalis pedis pulse full.  Other pedal pulses not easily palpable  Pulmonary/Chest: Effort normal and breath sounds normal.  Status post partial right mastectomy  Abdominal: Soft. Bowel sounds are normal. She exhibits no mass. There is no tenderness.  Musculoskeletal: Normal range of motion.  Lymphadenopathy:    She has no cervical adenopathy.  Neurological: She is alert and oriented to person, place, and time.  Intact to vibratory sensation and monofilament testing  Skin: Skin is warm and dry. No rash noted.  Psychiatric: She has a normal mood and affect. Her behavior is normal.          Assessment & Plan:   Preventive health examination Diabetes Hypertension Dyslipidemia  In view of her multiple consultants will reassess in one year  Review of Systems    as above Objective:   Physical Exam  As above      Assessment & Plan:   As above We'll check hemoglobin A1c and urine for microalbumin Follow-up multiple consultants Recheck here in one year or as needed

## 2014-09-06 NOTE — Progress Notes (Signed)
Pre visit review using our clinic review tool, if applicable. No additional management support is needed unless otherwise documented below in the visit note. 

## 2014-09-06 NOTE — Patient Instructions (Signed)
Please check your hemoglobin A1c every 3 months    It is important that you exercise regularly, at least 20 minutes 3 to 4 times per week.  If you develop chest pain or shortness of breath seek  medical attention.  Limit your sodium (Salt) intake  Preventive health services should include annual clinical examinations as well as annual mammograms.  Continue colonoscopies at 10 year intervals until age 74.  Continue annual eye examinations and immunizations as indicated

## 2014-09-24 ENCOUNTER — Telehealth: Payer: Self-pay | Admitting: Internal Medicine

## 2014-09-24 DIAGNOSIS — E780 Pure hypercholesterolemia: Secondary | ICD-10-CM | POA: Diagnosis not present

## 2014-09-24 DIAGNOSIS — E119 Type 2 diabetes mellitus without complications: Secondary | ICD-10-CM | POA: Diagnosis not present

## 2014-09-24 NOTE — Telephone Encounter (Signed)
Donna May needs to have her lab results from 09/06/2014 sent to [facility]  Fax: [phone]. They need these results ASAP, because they are holding the blood labs there so they won't draw twice for insurance purposes.

## 2014-09-24 NOTE — Telephone Encounter (Signed)
Left message on voicemail that lab results were faxed over as requested. Any questions please call the office.

## 2014-09-27 DIAGNOSIS — M8589 Other specified disorders of bone density and structure, multiple sites: Secondary | ICD-10-CM | POA: Diagnosis not present

## 2014-09-27 DIAGNOSIS — E119 Type 2 diabetes mellitus without complications: Secondary | ICD-10-CM | POA: Diagnosis not present

## 2014-09-27 DIAGNOSIS — E780 Pure hypercholesterolemia: Secondary | ICD-10-CM | POA: Diagnosis not present

## 2014-09-27 DIAGNOSIS — E559 Vitamin D deficiency, unspecified: Secondary | ICD-10-CM | POA: Diagnosis not present

## 2014-09-27 DIAGNOSIS — I10 Essential (primary) hypertension: Secondary | ICD-10-CM | POA: Diagnosis not present

## 2014-10-03 ENCOUNTER — Other Ambulatory Visit: Payer: Self-pay | Admitting: Cardiovascular Disease

## 2014-10-03 NOTE — Telephone Encounter (Signed)
Rx(s) sent to pharmacy electronically.  

## 2014-10-22 DIAGNOSIS — Z1231 Encounter for screening mammogram for malignant neoplasm of breast: Secondary | ICD-10-CM | POA: Diagnosis not present

## 2014-10-22 LAB — HM MAMMOGRAPHY

## 2014-11-09 ENCOUNTER — Encounter: Payer: Self-pay | Admitting: Internal Medicine

## 2014-11-20 ENCOUNTER — Other Ambulatory Visit: Payer: Self-pay | Admitting: Cardiovascular Disease

## 2014-12-05 ENCOUNTER — Telehealth: Payer: Self-pay | Admitting: Internal Medicine

## 2014-12-05 NOTE — Telephone Encounter (Signed)
Pt said when she in last to see Dr Raliegh Ip he said she needed some shots. Pt would like a call back about the type of shots and said she will be at work and can leave the information on her answering machine.

## 2014-12-06 NOTE — Telephone Encounter (Signed)
Left detailed message on voicemail need Flu shot and Pneumovax 23 pneumonia shot. If need an appointment for shots  please call office at [phone].

## 2014-12-31 DIAGNOSIS — E119 Type 2 diabetes mellitus without complications: Secondary | ICD-10-CM | POA: Diagnosis not present

## 2014-12-31 DIAGNOSIS — H2513 Age-related nuclear cataract, bilateral: Secondary | ICD-10-CM | POA: Diagnosis not present

## 2014-12-31 LAB — HM DIABETES EYE EXAM

## 2015-01-22 DIAGNOSIS — Z77128 Contact with and (suspected) exposure to other hazards in the physical environment: Secondary | ICD-10-CM | POA: Diagnosis not present

## 2015-01-22 DIAGNOSIS — Z01419 Encounter for gynecological examination (general) (routine) without abnormal findings: Secondary | ICD-10-CM | POA: Diagnosis not present

## 2015-01-22 DIAGNOSIS — Z779 Other contact with and (suspected) exposures hazardous to health: Secondary | ICD-10-CM | POA: Diagnosis not present

## 2015-01-28 DIAGNOSIS — N951 Menopausal and female climacteric states: Secondary | ICD-10-CM | POA: Diagnosis not present

## 2015-01-28 DIAGNOSIS — E559 Vitamin D deficiency, unspecified: Secondary | ICD-10-CM | POA: Diagnosis not present

## 2015-01-28 DIAGNOSIS — E119 Type 2 diabetes mellitus without complications: Secondary | ICD-10-CM | POA: Diagnosis not present

## 2015-01-28 DIAGNOSIS — E7800 Pure hypercholesterolemia, unspecified: Secondary | ICD-10-CM | POA: Diagnosis not present

## 2015-01-28 LAB — TSH: TSH: 0.88 u[IU]/mL (ref 0.41–5.90)

## 2015-01-28 LAB — LIPID PANEL
Cholesterol: 140 mg/dL (ref 0–200)
HDL: 63 mg/dL (ref 35–70)
LDL Cholesterol: 69 mg/dL
LDL/HDL RATIO: 1.1
Triglycerides: 40 mg/dL (ref 40–160)

## 2015-01-28 LAB — HEPATIC FUNCTION PANEL
ALT: 11 U/L (ref 7–35)
AST: 15 U/L (ref 13–35)
Alkaline Phosphatase: 49 U/L (ref 25–125)
Bilirubin, Total: 0.2 mg/dL

## 2015-01-28 LAB — BASIC METABOLIC PANEL
BUN: 16 mg/dL (ref 4–21)
Creatinine: 1.1 mg/dL (ref 0.5–1.1)
Glucose: 97 mg/dL
Potassium: 3.9 mmol/L (ref 3.4–5.3)
SODIUM: 143 mmol/L (ref 137–147)

## 2015-01-28 LAB — HEMOGLOBIN A1C: Hgb A1c MFr Bld: 6.9 % — AB (ref 4.0–6.0)

## 2015-01-31 ENCOUNTER — Other Ambulatory Visit: Payer: Self-pay | Admitting: Cardiovascular Disease

## 2015-01-31 DIAGNOSIS — M8589 Other specified disorders of bone density and structure, multiple sites: Secondary | ICD-10-CM | POA: Diagnosis not present

## 2015-01-31 DIAGNOSIS — E119 Type 2 diabetes mellitus without complications: Secondary | ICD-10-CM | POA: Diagnosis not present

## 2015-01-31 DIAGNOSIS — I10 Essential (primary) hypertension: Secondary | ICD-10-CM | POA: Diagnosis not present

## 2015-01-31 DIAGNOSIS — E7800 Pure hypercholesterolemia, unspecified: Secondary | ICD-10-CM | POA: Diagnosis not present

## 2015-01-31 DIAGNOSIS — E559 Vitamin D deficiency, unspecified: Secondary | ICD-10-CM | POA: Diagnosis not present

## 2015-01-31 DIAGNOSIS — I701 Atherosclerosis of renal artery: Secondary | ICD-10-CM

## 2015-02-04 ENCOUNTER — Ambulatory Visit (INDEPENDENT_AMBULATORY_CARE_PROVIDER_SITE_OTHER): Payer: Medicare Other | Admitting: *Deleted

## 2015-02-04 DIAGNOSIS — Z23 Encounter for immunization: Secondary | ICD-10-CM | POA: Diagnosis not present

## 2015-02-06 ENCOUNTER — Encounter: Payer: Self-pay | Admitting: Internal Medicine

## 2015-02-06 LAB — VITAMIN D 25-HYDROXY, D2 + D3: 25-HYDROXY, VITAMIN D: 30.3

## 2015-02-12 ENCOUNTER — Other Ambulatory Visit: Payer: Self-pay | Admitting: Gastroenterology

## 2015-02-12 ENCOUNTER — Other Ambulatory Visit: Payer: Self-pay | Admitting: Cardiovascular Disease

## 2015-02-14 ENCOUNTER — Other Ambulatory Visit: Payer: Self-pay | Admitting: Cardiovascular Disease

## 2015-02-14 NOTE — Telephone Encounter (Signed)
REFILL 

## 2015-02-15 ENCOUNTER — Ambulatory Visit (HOSPITAL_COMMUNITY)
Admission: RE | Admit: 2015-02-15 | Discharge: 2015-02-15 | Disposition: A | Discharge status: Home / Self Care | Payer: Medicare Other | Source: Ambulatory Visit | Attending: Cardiology | Admitting: Cardiology

## 2015-02-15 DIAGNOSIS — I10 Essential (primary) hypertension: Secondary | ICD-10-CM | POA: Insufficient documentation

## 2015-02-15 DIAGNOSIS — E785 Hyperlipidemia, unspecified: Secondary | ICD-10-CM | POA: Insufficient documentation

## 2015-02-15 DIAGNOSIS — I701 Atherosclerosis of renal artery: Secondary | ICD-10-CM | POA: Diagnosis not present

## 2015-02-15 DIAGNOSIS — E119 Type 2 diabetes mellitus without complications: Secondary | ICD-10-CM | POA: Insufficient documentation

## 2015-02-15 DIAGNOSIS — N281 Cyst of kidney, acquired: Secondary | ICD-10-CM | POA: Insufficient documentation

## 2015-02-20 ENCOUNTER — Telehealth: Payer: Self-pay | Admitting: Gastroenterology

## 2015-02-20 MED ORDER — LANSOPRAZOLE 30 MG PO CPDR: 30.0000 mg | DELAYED_RELEASE_CAPSULE | Freq: Every day | ORAL | Status: DC

## 2015-02-20 NOTE — Progress Notes (Signed)
[facility] 02/20/15 FOR TEST RESULTS

## 2015-02-22 ENCOUNTER — Other Ambulatory Visit: Payer: Self-pay

## 2015-02-22 MED ORDER — LANSOPRAZOLE 30 MG PO CPDR: 30.0000 mg | DELAYED_RELEASE_CAPSULE | Freq: Every day | ORAL | Status: DC

## 2015-03-17 DIAGNOSIS — I890 Lymphedema, not elsewhere classified: Secondary | ICD-10-CM

## 2015-03-17 HISTORY — DX: Lymphedema, not elsewhere classified: I89.0

## 2015-04-23 ENCOUNTER — Encounter: Payer: Self-pay | Admitting: Gastroenterology

## 2015-04-23 ENCOUNTER — Ambulatory Visit (INDEPENDENT_AMBULATORY_CARE_PROVIDER_SITE_OTHER): Payer: Medicare Other | Admitting: Gastroenterology

## 2015-04-23 VITALS — BP 144/78 | HR 84 | Ht 65.0 in | Wt 173.5 lb

## 2015-04-23 DIAGNOSIS — K219 Gastro-esophageal reflux disease without esophagitis: Secondary | ICD-10-CM

## 2015-04-23 DIAGNOSIS — K602 Anal fissure, unspecified: Secondary | ICD-10-CM | POA: Diagnosis not present

## 2015-04-23 DIAGNOSIS — K6289 Other specified diseases of anus and rectum: Secondary | ICD-10-CM

## 2015-04-23 MED ORDER — AMBULATORY NON FORMULARY MEDICATION: 0.0125 g | Freq: Two times a day (BID) | Status: DC

## 2015-04-23 NOTE — Progress Notes (Signed)
HPI :  75 y/o female here for follow up for GERD and for rectal discomfort. She is a prior patient of Dr. Deatra Ina.   She is here for follow up. She has had longstanding GERD with EGD in 2010 showing 4-5cm hiatal hernia but otherwise normal. She states generally she is doing okay. She has some occasional epigastric discomfort, which is relieved with drinking cold water. She thinks this has been occuring longstanding and unchanged. She is taking prevacid 30mg  daily. She has a hard time remembering to take it prior to eating. She repeorts it generally works well for her. She denies any breakthrough of heartburn. She denies dysphagia. No odynophagia.  She thinks she has gained 10 lbs, no weight loss recently.   She otherwise has sporadic pains in her anal canal which can come and go. She also has a small amount blood on the toilet paper, she is concerned symptoms due to hemorrhoids. She thinks this symptom has been ongoing for years and unchanged but has not had it evaluated. When she has pain it can occur sporadically, she does not think routinely related to bowel movements. She thinks it will last upwards of a minute at a time. She normally does not have pain with a bowel movement. Colonoscopy May 2010 was reportedly normal.  She otherwise has a history of breast cancer, in remission  Endoscopic history: Colonoscopy 2010 - normal EGD May 2010 - 4-5 cm hiatal hernia  Past Medical History  Diagnosis Date  . Arthritis   . Breast cancer ([place]) 1992  . Diabetes mellitus     type 2  . Heart murmur   . Hypertension   . Hyperlipidemia   . Fainting spell   . UTI (lower urinary tract infection)   . Duodenitis 01/18/2002  . GERD (gastroesophageal reflux disease)   . Hiatal hernia 08/08/2008, 01/18/2002  . History of pneumonia   . Anemia      Past Surgical History  Procedure Laterality Date  . Mastectomy  1992    right, with flap  . Tonsillectomy    . Axillary surgery      cyst removal, right    . Transthoracic echocardiogram  12/24/2009    LVEF =>55%, normal study  . Nm myocar perf wall motion  06/11/2009    Protocol:Bruce, post stress EF58%, EKG negative for ischemia, low risk   Family History  Problem Relation Age of Onset  . Breast cancer Cousin   . Diabetes Brother   . Heart disease Mother   . Hypertension Mother   . Heart disease Father   . Hypertension Father   . Prostate cancer Brother   . Heart attack Maternal Grandmother   . Stroke Maternal Grandfather    Social History  Substance Use Topics  . Smoking status: Former Research scientist (life sciences)  . Smokeless tobacco: Never Used  . Alcohol Use: 0.0 oz/week    0 Standard drinks or equivalent per week     Comment: occ   Current Outpatient Prescriptions  Medication Sig Dispense Refill  . albuterol (PROVENTIL HFA;VENTOLIN HFA) 108 (90 BASE) MCG/ACT inhaler Inhale 2 puffs into the lungs every 4 (four) hours as needed for wheezing or shortness of breath. 1 Inhaler 0  . amLODipine (NORVASC) 10 MG tablet Take 1 tablet (10 mg total) by mouth daily. NEED OV. 90 tablet 0  . aspirin 81 MG tablet Take 81 mg by mouth daily.    Marland Kitchen CALCIUM-MAGNESIUM-VITAMIN D PO Take 1 tablet by mouth daily.    Marland Kitchen  Cholecalciferol (VITAMIN D3) 2000 UNITS TABS Take 1 tablet by mouth daily.    . CRESTOR 20 MG tablet TAKE 1 TABLET DAILY 90 tablet 0  . Echinacea 400 MG CAPS Take 1 capsule by mouth daily.    Marland Kitchen EVISTA 60 MG tablet Take 60 mg by mouth daily.     Marland Kitchen guaiFENesin-codeine (CHERATUSSIN AC) 100-10 MG/5ML syrup Take 5 mLs by mouth every 4 (four) hours as needed for cough or congestion. 120 mL 0  . JANUVIA 100 MG tablet Take 100 mg by mouth daily.     . lansoprazole (PREVACID) 30 MG capsule Take 1 capsule (30 mg total) by mouth daily before breakfast. 90 capsule 0  . metFORMIN (GLUCOPHAGE-XR) 500 MG 24 hr tablet Take 500 mg by mouth 2 (two) times daily before a meal.     . metoprolol succinate (TOPROL-XL) 50 MG 24 hr tablet TAKE 1 1/2 TABLETS DAILY 135 tablet 3   . Multiple Vitamins-Minerals (CENTRUM SILVER ULTRA WOMENS) TABS Take 1 tablet by mouth daily.    . rosuvastatin (CRESTOR) 20 MG tablet Take 1 tablet (20 mg total) by mouth daily. 90 tablet 0  . triamcinolone cream (KENALOG) 0.1 %     . valsartan-hydrochlorothiazide (DIOVAN-HCT) 320-12.5 MG tablet Take 1 tablet by mouth daily. NEED OV. 90 tablet 0   Current Facility-Administered Medications  Medication Dose Route Frequency Provider Last Rate Last Dose  . triamcinolone acetonide (KENALOG) 10 MG/ML injection 10 mg  10 mg Other Once Harriet Masson, DPM       No Known Allergies   Review of Systems: All systems reviewed and negative except where noted in HPI.   Lab Results  Component Value Date   WBC 5.6 12/01/2012   HGB 11.6* 12/01/2012   HCT 35.0* 12/01/2012   MCV 84.1 12/01/2012   PLT 227 12/01/2012    Lab Results  Component Value Date   CREATININE 1.1 01/28/2015   BUN 16 01/28/2015   NA 143 01/28/2015   K 3.9 01/28/2015   CL 105 12/01/2012   CO2 29 12/01/2012    Lab Results  Component Value Date   ALT 11 01/28/2015   AST 15 01/28/2015   ALKPHOS 49 01/28/2015   BILITOT 0.3 12/01/2012     Physical Exam: BP 144/78 mmHg  Pulse 84  Ht 5\' 5"  (1.651 m)  Wt 173 lb 8 oz (78.699 kg)  BMI 28.87 kg/m2 Constitutional: Pleasant,well-developed, female in no acute distress. HEENT: Normocephalic and atraumatic. Conjunctivae are normal. No scleral icterus. Neck supple.  Cardiovascular: Normal rate, regular rhythm.  Pulmonary/chest: Effort normal and breath sounds normal. No wheezing, rales or rhonchi. Abdominal: Soft, nondistended, nontender. Bowel sounds active throughout. There are no masses palpable. No hepatomegaly. Anoscopy/DRE: small anal fissure posterior anal canal, small internal hemorrhoids, no mass on DRE, otherwise normal tone Extremities: no edema Lymphadenopathy: No cervical adenopathy noted. Neurological: Alert and oriented to person place and time. Skin: Skin  is warm and dry. No rashes noted. Psychiatric: Normal mood and affect. Behavior is normal.   ASSESSMENT AND PLAN: 75 y/o female with a history of breast cancer and in remission, here for follow up for GERD and anal canal discomfort.  GERD - well controlled on prevacid. Occasionally drinks water to relieve "discomfort", perhaps related to spasm. She has no history of Barrett's. Recommend lowest daily dose of PPI needed to control symptoms or use it PRN. She wishes to continue regimen for now, recommend she take it on an empty stomach 30-60 minutes prior  to meal to ensure good absorption.   Anal pain / occasional bleeding - history as above. Anoscopy shows small anal fissure of the posterior anal canal with internal hemorrhoids. Unclear if this small fissure is causing intermittent symptoms, as her symptoms are not reliably related to a bowel movement, or perhaps she is having spasm as well. Recommend a trial of topical 0.125% nitroglycerin to see if this helps her symptoms. If no improvement or symptoms persist she should f/u PRN for this issue. Otherwise due for recall colonoscopy 2020 for screening purposes if she wishes to continue screening at this point in her life. She agreed.   [place] Cellar, MD [facility] Pager [phone]

## 2015-04-23 NOTE — Patient Instructions (Addendum)
You may pick up the Nitroglycerin ointment from [facility] in the [facility]. [place].

## 2015-05-16 ENCOUNTER — Other Ambulatory Visit: Payer: Self-pay | Admitting: Cardiovascular Disease

## 2015-05-17 ENCOUNTER — Other Ambulatory Visit: Payer: Self-pay | Admitting: Cardiovascular Disease

## 2015-05-28 DIAGNOSIS — E559 Vitamin D deficiency, unspecified: Secondary | ICD-10-CM | POA: Diagnosis not present

## 2015-05-28 DIAGNOSIS — E7800 Pure hypercholesterolemia, unspecified: Secondary | ICD-10-CM | POA: Diagnosis not present

## 2015-05-28 DIAGNOSIS — E119 Type 2 diabetes mellitus without complications: Secondary | ICD-10-CM | POA: Diagnosis not present

## 2015-05-30 ENCOUNTER — Telehealth: Payer: Self-pay | Admitting: Cardiovascular Disease

## 2015-05-30 DIAGNOSIS — I10 Essential (primary) hypertension: Secondary | ICD-10-CM | POA: Diagnosis not present

## 2015-05-30 DIAGNOSIS — E119 Type 2 diabetes mellitus without complications: Secondary | ICD-10-CM | POA: Diagnosis not present

## 2015-05-30 DIAGNOSIS — M8589 Other specified disorders of bone density and structure, multiple sites: Secondary | ICD-10-CM | POA: Diagnosis not present

## 2015-05-30 DIAGNOSIS — E559 Vitamin D deficiency, unspecified: Secondary | ICD-10-CM | POA: Diagnosis not present

## 2015-05-30 DIAGNOSIS — E7800 Pure hypercholesterolemia, unspecified: Secondary | ICD-10-CM | POA: Diagnosis not present

## 2015-05-30 MED ORDER — CRESTOR 20 MG PO TABS: 20.0000 mg | ORAL_TABLET | Freq: Every day | ORAL | Status: DC

## 2015-05-30 NOTE — Telephone Encounter (Signed)
SPOKE TO PATIENT.  PATIENT STATES SHE WAS AWARE SHE NEED AN APPOINTMENT. SHE WAS NOT PLEASED ABOUT HER LAST APPOINTMENT. SHE STATES SHE GOT THE IMPRESSION THAT PHYSICIAN DID NOT WANT TO SEE HER.  RN OFFERED TO  SET UP AN APPOINTMENT WITH ANOTHER PHYSICIAN.  SHE STATES SHE WILL BE TRY AGAIN - WITH APPOINTMENT   APPOINTMENT SCHEDULE  06/10/15  10:45 AM  WITH DR [name] REFILLED MEDICATION FOR 90 DAY SUPPLY NO REFILLS- BRAND NAME CRESTOR  REQUESTED BY PATIENT SHE STATED PREVIOUS DOCTOR ( [place]) ONLY TAKE BRAND  INFORMED PATIENT THAT GENERIC IS JUST A GOOD AS BRAND- INSURANCE MAY NOT COVER. SHE STATES PLEASE TRY. E-SENT BRAND  ONLY-  CAN HAVE THE YEARLY DONE AT APPOINTMENT.

## 2015-05-30 NOTE — Telephone Encounter (Signed)
Not sure why she got that impression, glad to see her.

## 2015-05-30 NOTE — Telephone Encounter (Signed)
New message      *STAT* If patient is at the pharmacy, call can be transferred to refill team.   1. Which medications need to be refilled? (please list name of each medication and dose if known)  crestor 2. Which pharmacy/location (including street and city if local pharmacy) is medication to be sent to? [facility] 3. Do they need a 30 day or 90 day supply? 90 day supply Pt is out of crestor

## 2015-06-10 ENCOUNTER — Ambulatory Visit (INDEPENDENT_AMBULATORY_CARE_PROVIDER_SITE_OTHER): Payer: Medicare Other | Admitting: Cardiovascular Disease

## 2015-06-10 ENCOUNTER — Encounter: Payer: Self-pay | Admitting: Cardiovascular Disease

## 2015-06-10 VITALS — BP 144/80 | HR 60 | Ht 65.0 in | Wt 170.6 lb

## 2015-06-10 DIAGNOSIS — E7800 Pure hypercholesterolemia, unspecified: Secondary | ICD-10-CM

## 2015-06-10 DIAGNOSIS — E0821 Diabetes mellitus due to underlying condition with diabetic nephropathy: Secondary | ICD-10-CM

## 2015-06-10 DIAGNOSIS — I701 Atherosclerosis of renal artery: Secondary | ICD-10-CM | POA: Diagnosis not present

## 2015-06-10 DIAGNOSIS — I10 Essential (primary) hypertension: Secondary | ICD-10-CM

## 2015-06-10 NOTE — Progress Notes (Signed)
Patient ID: Donna May, female   DOB: 06/02/1940, 75 y.o.   MRN: [id number]    Cardiology Office Note    Date:  06/11/2015   ID:  Aquanette, Landeck 10/15/1940, MRN [id number]  PCP:  Nyoka Cowden, MD  Cardiologist:   Sanda Klein, MD   Chief Complaint  Patient presents with  . Follow-up    last OV 2015  pt c/o chest discomfort--stops when she drinks something cold; occasional SOB--sometimes has to gasp for a breath; occasional dizziness when turning too fast; swelling in both legs; tingling in right arm    History of Present Illness:  Donna May is a 75 y.o. female with long-standing systemic hypertension, nonobstructive bilateral renal artery disease, long-standing hyperlipidemia and type 2 diabetes mellitus with diabetic nephropathy. She returns in follow-up with complaints of episodes of severely elevated blood pressure. These are sometimes associated with emotional upset. On March 16 her initial blood pressure was 210/108 subsequently decreased to 187/82.  Her most recent renal duplex ultrasound performed in March 2016 did not show any evidence of renal artery stenosis. She has preserved left ventricular systolic function, but has evidence of left ventricular hypertrophy by echocardiography and ECG, but has not had clinical manifestations of heart failure.  She has well-controlled diabetes mellitus and her lipid profile is within target range on statin therapy.  She does not have exertional angina. She has occasional mild chest discomfort that resolves with drinking fluids. She has occasional shortness of breath but she describes this as the need to take a deep breath while at rest rather than true exertional dyspnea. She has some dizziness when she turns her head too fast. Sounds like vertigo rather than orthostatic hypotension.   Past Medical History  Diagnosis Date  . Arthritis   . Breast cancer ([place]) 1992  . Diabetes mellitus     type 2  . Heart murmur   .  Hypertension   . Hyperlipidemia   . Fainting spell   . UTI (lower urinary tract infection)   . Duodenitis 01/18/2002  . GERD (gastroesophageal reflux disease)   . Hiatal hernia 08/08/2008, 01/18/2002  . History of pneumonia   . Anemia     Past Surgical History  Procedure Laterality Date  . Mastectomy  1992    right, with flap  . Tonsillectomy    . Axillary surgery      cyst removal, right  . Transthoracic echocardiogram  12/24/2009    LVEF =>55%, normal study  . Nm myocar perf wall motion  06/11/2009    Protocol:Bruce, post stress EF58%, EKG negative for ischemia, low risk    Outpatient Prescriptions Prior to Visit  Medication Sig Dispense Refill  . albuterol (PROVENTIL HFA;VENTOLIN HFA) 108 (90 BASE) MCG/ACT inhaler Inhale 2 puffs into the lungs every 4 (four) hours as needed for wheezing or shortness of breath. 1 Inhaler 0  . AMBULATORY NON FORMULARY MEDICATION Place 0.0125 g rectally 2 (two) times daily. Medication Name: Nitroglycerin ointment .[id number]. Apply 2-3 times daily as needed. 1 Tube 1  . amLODipine (NORVASC) 10 MG tablet Take 1 tablet (10 mg total) by mouth daily. NEED OV. 90 tablet 0  . aspirin 81 MG tablet Take 81 mg by mouth daily.    Marland Kitchen CALCIUM-MAGNESIUM-VITAMIN D PO Take 1 tablet by mouth daily.    . Cholecalciferol (VITAMIN D3) 2000 UNITS TABS Take 1 tablet by mouth daily.    . CRESTOR 20 MG tablet Take 1 tablet (20 mg total) by mouth  daily. 90 tablet 0  . Echinacea 400 MG CAPS Take 1 capsule by mouth daily.    Marland Kitchen EVISTA 60 MG tablet Take 60 mg by mouth daily.     Marland Kitchen JANUVIA 100 MG tablet Take 100 mg by mouth daily.     . lansoprazole (PREVACID) 30 MG capsule Take 1 capsule (30 mg total) by mouth daily before breakfast. 90 capsule 0  . metFORMIN (GLUCOPHAGE-XR) 500 MG 24 hr tablet Take 500 mg by mouth 2 (two) times daily before a meal.     . metoprolol succinate (TOPROL-XL) 50 MG 24 hr tablet TAKE 1 1/2 TABLETS DAILY 135 tablet 3  . Multiple Vitamins-Minerals (CENTRUM  SILVER ULTRA WOMENS) TABS Take 1 tablet by mouth daily.    . valsartan-hydrochlorothiazide (DIOVAN-HCT) 320-12.5 MG tablet Take 1 tablet by mouth daily. NEED OV. 90 tablet 0  . guaiFENesin-codeine (CHERATUSSIN AC) 100-10 MG/5ML syrup Take 5 mLs by mouth every 4 (four) hours as needed for cough or congestion. 120 mL 0  . triamcinolone cream (KENALOG) 0.1 %      Facility-Administered Medications Prior to Visit  Medication Dose Route Frequency Provider Last Rate Last Dose  . triamcinolone acetonide (KENALOG) 10 MG/ML injection 10 mg  10 mg Other Once Harriet Masson, DPM         Allergies:   Review of patient's allergies indicates no known allergies.   Social History   Social History  . Marital Status: Single    Spouse Name: N/A  . Number of Children: 2  . Years of Education: N/A   Occupational History  . retired    Social History Main Topics  . Smoking status: Former Research scientist (life sciences)  . Smokeless tobacco: Never Used  . Alcohol Use: 0.0 oz/week    0 Standard drinks or equivalent per week     Comment: occ  . Drug Use: No  . Sexual Activity: Not Asked   Other Topics Concern  . None   Social History Narrative     Family History:  The patient's family history includes Breast cancer in her cousin; Diabetes in her brother; Heart attack in her maternal grandmother; Heart disease in her father and mother; Hypertension in her father and mother; Prostate cancer in her brother; Stroke in her maternal grandfather.   ROS:   Please see the history of present illness.    ROS All other systems reviewed and are negative.   PHYSICAL EXAM:   VS:  BP 144/80 mmHg  Pulse 60  Ht 5\' 5"  (1.651 m)  Wt 77.384 kg (170 lb 9.6 oz)  BMI 28.39 kg/m2   GEN: Well nourished, well developed, in no acute distress HEENT: normal Neck: no JVD, carotid bruits, or masses Cardiac: S4 is present, RRR; no murmurs, rubs, or gallops,no edema  Respiratory:  clear to auscultation bilaterally, normal work of breathing GI:  soft, nontender, nondistended, + BS MS: no deformity or atrophy Skin: warm and dry, no rash Neuro:  Alert and Oriented x 3, Strength and sensation are intact Psych: euthymic mood, full affect  Wt Readings from Last 3 Encounters:  06/10/15 77.384 kg (170 lb 9.6 oz)  04/23/15 78.699 kg (173 lb 8 oz)  09/06/14 75.297 kg (166 lb)      Studies/Labs Reviewed:   EKG:  EKG is ordered today.  The ekg ordered today demonstrates , sinus rhythm, left ventricular hypertrophy with secondary repolarization changes  Recent Labs: 01/28/2015: ALT 11; BUN 16; Creatinine 1.1; Potassium 3.9; Sodium 143; TSH 0.88  Lipid Panel    Component Value Date/Time   CHOL 140 01/28/2015   TRIG 40 01/28/2015   HDL 63 01/28/2015   LDLCALC 69 01/28/2015   Most recent labs showed total cholesterol 174, triglycerides 35, HDL 70, LDL 97, hemoglobin A1c 6.7% ASSESSMENT:    1. Essential hypertension   2. Renal artery stenosis, native, bilateral ([place])   3. Diabetes mellitus due to underlying condition with diabetic nephropathy, without long-term current use of insulin ([place])   4. HYPERCHOLESTEROLEMIA      PLAN:  In order of problems listed above:  1. HTN: Her blood pressure is slightly high today and has had marked elevation recently. This seems to be related to stressful situations. The optimal agent is probably her beta blocker. Will increase the dose 200 mg daily. If her blood pressure remains elevated with than increase the hydrochlorothiazide to 25 mg daily. 2. Diagnosis of renal artery stenosis was not confirmed by her most recent ultrasound study 3. DM: Well-controlled diabetes mellitus 4. HLP: Lipid parameters within the desirable range    Medication Adjustments/Labs and Tests Ordered: Current medicines are reviewed at length with the patient today.  Concerns regarding medicines are outlined above.  Medication changes, Labs and Tests ordered today are listed in the Patient Instructions below. Patient  Instructions  Dr Sallyanne Kuster has recommended making the following medication changes: 1. INCREASE Metoprolol Succinate (ToprolXL) to 100 mg - you may take 2 50 mg tablets  **Please take your blood pressure daily. Call the office to speak with a nurse regarding your blood pressures in 1 WEEK.  Dr Sallyanne Kuster recommends that you schedule a follow-up appointment in 1 year. You will receive a reminder letter in the mail two months in advance. If you don't receive a letter, please call our office to schedule the follow-up appointment.  If you need a refill on your cardiac medications before your next appointment, please call your pharmacy.      Mikael Spray, MD  06/11/2015 4:10 PM    [facility] [address], [place], [place]  [address] Phone: [phone]; Fax: [phone]

## 2015-06-10 NOTE — Patient Instructions (Signed)
Dr Sallyanne Kuster has recommended making the following medication changes: 1. INCREASE Metoprolol Succinate (ToprolXL) to 100 mg - you may take 2 50 mg tablets  **Please take your blood pressure daily. Call the office to speak with a nurse regarding your blood pressures in 1 WEEK.  Dr Sallyanne Kuster recommends that you schedule a follow-up appointment in 1 year. You will receive a reminder letter in the mail two months in advance. If you don't receive a letter, please call our office to schedule the follow-up appointment.  If you need a refill on your cardiac medications before your next appointment, please call your pharmacy.

## 2015-06-11 ENCOUNTER — Encounter: Payer: Self-pay | Admitting: Cardiovascular Disease

## 2015-07-04 ENCOUNTER — Other Ambulatory Visit: Payer: Self-pay | Admitting: Cardiovascular Disease

## 2015-07-04 ENCOUNTER — Other Ambulatory Visit: Payer: Self-pay | Admitting: Gastroenterology

## 2015-07-05 NOTE — Telephone Encounter (Signed)
REFILL 

## 2015-07-15 DIAGNOSIS — H25812 Combined forms of age-related cataract, left eye: Secondary | ICD-10-CM | POA: Diagnosis not present

## 2015-07-15 DIAGNOSIS — H04123 Dry eye syndrome of bilateral lacrimal glands: Secondary | ICD-10-CM | POA: Diagnosis not present

## 2015-07-15 DIAGNOSIS — E119 Type 2 diabetes mellitus without complications: Secondary | ICD-10-CM | POA: Diagnosis not present

## 2015-07-15 DIAGNOSIS — H25811 Combined forms of age-related cataract, right eye: Secondary | ICD-10-CM | POA: Diagnosis not present

## 2015-07-22 ENCOUNTER — Other Ambulatory Visit: Payer: Self-pay | Admitting: Cardiovascular Disease

## 2015-07-22 NOTE — Telephone Encounter (Signed)
Rx Refill

## 2015-08-07 DIAGNOSIS — H2512 Age-related nuclear cataract, left eye: Secondary | ICD-10-CM | POA: Diagnosis not present

## 2015-08-07 DIAGNOSIS — H269 Unspecified cataract: Secondary | ICD-10-CM | POA: Diagnosis not present

## 2015-08-07 DIAGNOSIS — H52202 Unspecified astigmatism, left eye: Secondary | ICD-10-CM | POA: Diagnosis not present

## 2015-08-25 LAB — GLUCOSE, POCT (MANUAL RESULT ENTRY): POC GLUCOSE: 127 mg/dL — AB (ref 70–99)

## 2015-09-10 DIAGNOSIS — H2511 Age-related nuclear cataract, right eye: Secondary | ICD-10-CM | POA: Diagnosis not present

## 2015-09-11 DIAGNOSIS — H2511 Age-related nuclear cataract, right eye: Secondary | ICD-10-CM | POA: Diagnosis not present

## 2015-09-11 DIAGNOSIS — H25811 Combined forms of age-related cataract, right eye: Secondary | ICD-10-CM | POA: Diagnosis not present

## 2015-09-11 DIAGNOSIS — H52221 Regular astigmatism, right eye: Secondary | ICD-10-CM | POA: Diagnosis not present

## 2015-10-01 DIAGNOSIS — E7800 Pure hypercholesterolemia, unspecified: Secondary | ICD-10-CM | POA: Diagnosis not present

## 2015-10-01 DIAGNOSIS — E559 Vitamin D deficiency, unspecified: Secondary | ICD-10-CM | POA: Diagnosis not present

## 2015-10-01 DIAGNOSIS — E119 Type 2 diabetes mellitus without complications: Secondary | ICD-10-CM | POA: Diagnosis not present

## 2015-10-04 DIAGNOSIS — E559 Vitamin D deficiency, unspecified: Secondary | ICD-10-CM | POA: Diagnosis not present

## 2015-10-04 DIAGNOSIS — E7800 Pure hypercholesterolemia, unspecified: Secondary | ICD-10-CM | POA: Diagnosis not present

## 2015-10-04 DIAGNOSIS — I10 Essential (primary) hypertension: Secondary | ICD-10-CM | POA: Diagnosis not present

## 2015-10-04 DIAGNOSIS — E119 Type 2 diabetes mellitus without complications: Secondary | ICD-10-CM | POA: Diagnosis not present

## 2015-10-04 DIAGNOSIS — M8589 Other specified disorders of bone density and structure, multiple sites: Secondary | ICD-10-CM | POA: Diagnosis not present

## 2015-10-07 ENCOUNTER — Telehealth: Payer: Self-pay | Admitting: Gastroenterology

## 2015-10-07 NOTE — Telephone Encounter (Signed)
Notified patient that lansoprazole was approved. Patient verbalized understanding.

## 2015-10-07 NOTE — Telephone Encounter (Signed)
Called [facility] to initiate a PA for lansoprazole. PA for lansoprazole was approved from 08/08/15-10/06/16.   Left a message for patient to return my call.

## 2015-10-10 ENCOUNTER — Other Ambulatory Visit: Payer: Self-pay | Admitting: Cardiovascular Disease

## 2015-10-10 NOTE — Telephone Encounter (Signed)
REFILL 

## 2015-10-11 ENCOUNTER — Other Ambulatory Visit: Payer: Self-pay

## 2015-10-11 ENCOUNTER — Ambulatory Visit: Payer: Medicare Other | Admitting: Family Medicine

## 2015-10-11 ENCOUNTER — Telehealth: Payer: Self-pay | Admitting: Cardiovascular Disease

## 2015-10-11 MED ORDER — TETANUS-DIPHTH-ACELL PERTUSSIS 5-2.5-18.5 LF-MCG/0.5 IM SUSP: 0.5000 mL | Freq: Once | INTRAMUSCULAR | 0 refills | Status: AC

## 2015-10-11 MED ORDER — METOPROLOL SUCCINATE ER 100 MG PO TB24: 100.0000 mg | ORAL_TABLET | Freq: Every day | ORAL | 1 refills | Status: DC

## 2015-10-11 NOTE — Telephone Encounter (Signed)
Patient walked in. Needs refill of metoprolol succinate. This medication dose was increased at last MD OV from 75mg  QD to 100mg  QD. She prefers to have 1-100mg  tablet she takes daily instead of 2 tablets. Rx(s) sent to pharmacy electronically.

## 2015-10-14 ENCOUNTER — Encounter: Payer: Self-pay | Admitting: Endocrinology

## 2015-10-22 ENCOUNTER — Other Ambulatory Visit: Payer: Self-pay | Admitting: Cardiovascular Disease

## 2015-10-22 NOTE — Telephone Encounter (Signed)
Rx has been sent to the pharmacy electronically. ° °

## 2015-10-24 ENCOUNTER — Ambulatory Visit (INDEPENDENT_AMBULATORY_CARE_PROVIDER_SITE_OTHER): Payer: Medicare Other | Admitting: Family Medicine

## 2015-10-24 ENCOUNTER — Encounter: Payer: Self-pay | Admitting: Family Medicine

## 2015-10-24 VITALS — BP 142/64 | HR 77 | Temp 98.4°F | Ht 65.0 in | Wt 171.4 lb

## 2015-10-24 DIAGNOSIS — R609 Edema, unspecified: Secondary | ICD-10-CM | POA: Diagnosis not present

## 2015-10-24 DIAGNOSIS — R600 Localized edema: Secondary | ICD-10-CM

## 2015-10-24 DIAGNOSIS — M7989 Other specified soft tissue disorders: Secondary | ICD-10-CM

## 2015-10-24 DIAGNOSIS — I701 Atherosclerosis of renal artery: Secondary | ICD-10-CM

## 2015-10-24 LAB — CBC WITH DIFFERENTIAL/PLATELET
BASOS ABS: 0 10*3/uL (ref 0.0–0.1)
Basophils Relative: 0.4 % (ref 0.0–3.0)
Eosinophils Absolute: 0.2 10*3/uL (ref 0.0–0.7)
Eosinophils Relative: 4.5 % (ref 0.0–5.0)
HEMATOCRIT: 36.4 % (ref 36.0–46.0)
HEMOGLOBIN: 11.9 g/dL — AB (ref 12.0–15.0)
LYMPHS PCT: 43.4 % (ref 12.0–46.0)
Lymphs Abs: 2.1 10*3/uL (ref 0.7–4.0)
MCHC: 32.6 g/dL (ref 30.0–36.0)
MCV: 86.2 fl (ref 78.0–100.0)
MONOS PCT: 11.1 % (ref 3.0–12.0)
Monocytes Absolute: 0.5 10*3/uL (ref 0.1–1.0)
NEUTROS ABS: 2 10*3/uL (ref 1.4–7.7)
Neutrophils Relative %: 40.6 % — ABNORMAL LOW (ref 43.0–77.0)
PLATELETS: 205 10*3/uL (ref 150.0–400.0)
RBC: 4.22 Mil/uL (ref 3.87–5.11)
RDW: 16.1 % — ABNORMAL HIGH (ref 11.5–15.5)
WBC: 4.8 10*3/uL (ref 4.0–10.5)

## 2015-10-24 LAB — TSH: TSH: 1.55 u[IU]/mL (ref 0.35–4.50)

## 2015-10-24 NOTE — Progress Notes (Signed)
Pre visit review using our clinic review tool, if applicable. No additional management support is needed unless otherwise documented below in the visit note. 

## 2015-10-24 NOTE — Patient Instructions (Signed)
BEFORE YOU LEAVE: -follow up: 1 week -labs -Ultrasound information from [facility]  See your gynecologist for breast exam and evaluation  Get the ultrasound  Elevated arm for 30 minutes 2x daily

## 2015-10-24 NOTE — Progress Notes (Signed)
HPI:  Donna May is a pleasant 75 yo with a reported history R mastectomy and lymph node dissection remotely, here for an acute visit for R arm swelling. Reports it has been swelling for about 10 days. Denies pain, redness, fevers, malaise, joint pain or breast or chest pain. Reports will be seeing her gynecologist for breast exam and they plan to do mammogram in the office. Report gynecologist follows her for her remote history of breast ca. Denies IV, insect bite or trauma to the arm.   ROS: See pertinent positives and negatives per HPI.  Past Medical History:  Diagnosis Date  . Anemia   . Arthritis   . Breast cancer ([place]) 1992  . Diabetes mellitus    type 2  . Duodenitis 01/18/2002  . Fainting spell   . GERD (gastroesophageal reflux disease)   . Heart murmur   . Hiatal hernia 08/08/2008, 01/18/2002  . History of pneumonia   . Hyperlipidemia   . Hypertension   . UTI (lower urinary tract infection)     Past Surgical History:  Procedure Laterality Date  . AXILLARY SURGERY     cyst removal, right  . MASTECTOMY  1992   right, with flap  . NM MYOCAR PERF WALL MOTION  06/11/2009   Protocol:Bruce, post stress EF58%, EKG negative for ischemia, low risk  . TONSILLECTOMY    . TRANSTHORACIC ECHOCARDIOGRAM  12/24/2009   LVEF =>55%, normal study    Family History  Problem Relation Age of Onset  . Breast cancer Cousin   . Diabetes Brother   . Heart disease Mother   . Hypertension Mother   . Heart disease Father   . Hypertension Father   . Prostate cancer Brother   . Heart attack Maternal Grandmother   . Stroke Maternal Grandfather     Social History   Social History  . Marital status: Single    Spouse name: N/A  . Number of children: 2  . Years of education: N/A   Occupational History  . retired    Social History Main Topics  . Smoking status: Former Research scientist (life sciences)  . Smokeless tobacco: Never Used  . Alcohol use 0.0 oz/week     Comment: occ  . Drug use: No  . Sexual  activity: Not Asked   Other Topics Concern  . None   Social History Narrative  . None     Current Outpatient Prescriptions:  .  albuterol (PROVENTIL HFA;VENTOLIN HFA) 108 (90 BASE) MCG/ACT inhaler, Inhale 2 puffs into the lungs every 4 (four) hours as needed for wheezing or shortness of breath., Disp: 1 Inhaler, Rfl: 0 .  AMBULATORY NON FORMULARY MEDICATION, Place 0.0125 g rectally 2 (two) times daily. Medication Name: Nitroglycerin ointment .[id number]. Apply 2-3 times daily as needed., Disp: 1 Tube, Rfl: 1 .  amLODipine (NORVASC) 10 MG tablet, TAKE 1 TABLET (10 MG TOTAL) BY MOUTH DAILY. NEED OFFICE VISIT, Disp: 90 tablet, Rfl: 3 .  aspirin 81 MG tablet, Take 81 mg by mouth daily., Disp: , Rfl:  .  CALCIUM-MAGNESIUM-VITAMIN D PO, Take 1 tablet by mouth daily., Disp: , Rfl:  .  Cholecalciferol (VITAMIN D3) 2000 UNITS TABS, Take 1 tablet by mouth daily., Disp: , Rfl:  .  CRESTOR 20 MG tablet, Take 1 tablet (20 mg total) by mouth daily., Disp: 90 tablet, Rfl: 0 .  Echinacea 400 MG CAPS, Take 1 capsule by mouth daily., Disp: , Rfl:  .  EVISTA 60 MG tablet, Take 60 mg by mouth  daily. , Disp: , Rfl:  .  JANUVIA 100 MG tablet, Take 100 mg by mouth daily. , Disp: , Rfl:  .  lansoprazole (PREVACID) 30 MG capsule, TAKE 1 CAPSULE DAILY BEFOREBREAKFAST, Disp: 90 capsule, Rfl: 1 .  metFORMIN (GLUCOPHAGE-XR) 500 MG 24 hr tablet, Take 500 mg by mouth 2 (two) times daily before a meal. , Disp: , Rfl:  .  metoprolol succinate (TOPROL-XL) 100 MG 24 hr tablet, Take 1 tablet (100 mg total) by mouth daily. Take with or immediately following a meal., Disp: 90 tablet, Rfl: 1 .  Multiple Vitamins-Minerals (CENTRUM SILVER ULTRA WOMENS) TABS, Take 1 tablet by mouth daily., Disp: , Rfl:  .  Omega 3 1000 MG CAPS, Take 1 capsule by mouth daily., Disp: , Rfl:  .  valsartan-hydrochlorothiazide (DIOVAN-HCT) 320-12.5 MG tablet, TAKE 1 TABLET BY MOUTH DAILY. NEED OV., Disp: 90 tablet, Rfl: 1  Current Facility-Administered  Medications:  .  triamcinolone acetonide (KENALOG) 10 MG/ML injection 10 mg, 10 mg, Other, Once, [employer], DPM  EXAM:  Vitals:   10/24/15 0901  BP: (!) 142/64  Pulse: 77  Temp: 98.4 F (36.9 C)    Body mass index is 28.52 kg/m.  GENERAL: vitals reviewed and listed above, alert, oriented, appears well hydrated and in no acute distress  HEENT: atraumatic, conjunttiva clear, no obvious abnormalities on inspection of external nose and ears  NECK: no obvious masses on inspection  LUNGS: clear to auscultation bilaterally, no wheezes, rales or rhonchi, good air movement  CV: HRRR, no peripheral edema, normal radial pulses bilat  MS: moves all extremities without noticeable abnormality, generalized non-pitting edema of the distal 1/3 R arm without appreciable warmth, TTP or inderation. Questionable mild erythema of the skin. Normal ROM all joints without TTP.  LAD: no appreciable axillary or ext LAD  PSYCH: pleasant and cooperative, no obvious depression or anxiety  ASSESSMENT AND PLAN:  Discussed the following assessment and plan:  Arm swelling - Plan: VAS [place] UPPER EXTREMITY VENOUS DUPLEX, CBC with Differential/Platelets  Arm edema - Plan: TSH  -we discussed possible serious and likely etiologies, workup and treatment, treatment risks and return precautions - no signs of obvious joint pathology or infection, query DVT or lymphedema vs other -after this discussion, Donna May opted for: -labs, [place] -advised breast/axillary eval with gynecologist promptly as planned -elevation for now and close follow up to discuss results and further eval or plan pending testing -Patient advised to return or notify a doctor immediately if symptoms worsen or persist or new concerns arise.  Patient Instructions  BEFORE YOU LEAVE: -follow up: 1 week -labs -Ultrasound information from Donna May  See your gynecologist for breast exam and evaluation  Get the ultrasound  Elevated arm for 30  minutes 2x daily      Donna May R., DO

## 2015-10-29 ENCOUNTER — Ambulatory Visit (HOSPITAL_COMMUNITY)
Admission: RE | Admit: 2015-10-29 | Discharge: 2015-10-29 | Disposition: A | Discharge status: Home / Self Care | Payer: Medicare Other | Source: Ambulatory Visit | Attending: Family Medicine | Admitting: Family Medicine

## 2015-10-29 DIAGNOSIS — K219 Gastro-esophageal reflux disease without esophagitis: Secondary | ICD-10-CM | POA: Insufficient documentation

## 2015-10-29 DIAGNOSIS — Z1231 Encounter for screening mammogram for malignant neoplasm of breast: Secondary | ICD-10-CM | POA: Diagnosis not present

## 2015-10-29 DIAGNOSIS — M7989 Other specified soft tissue disorders: Secondary | ICD-10-CM

## 2015-10-29 DIAGNOSIS — E119 Type 2 diabetes mellitus without complications: Secondary | ICD-10-CM | POA: Insufficient documentation

## 2015-10-29 DIAGNOSIS — I10 Essential (primary) hypertension: Secondary | ICD-10-CM | POA: Insufficient documentation

## 2015-10-29 DIAGNOSIS — E785 Hyperlipidemia, unspecified: Secondary | ICD-10-CM | POA: Insufficient documentation

## 2015-10-31 ENCOUNTER — Encounter (HOSPITAL_COMMUNITY): Payer: Medicare Other

## 2015-10-31 DIAGNOSIS — Z853 Personal history of malignant neoplasm of breast: Secondary | ICD-10-CM | POA: Diagnosis not present

## 2015-10-31 DIAGNOSIS — N63 Unspecified lump in breast: Secondary | ICD-10-CM | POA: Diagnosis not present

## 2015-11-01 ENCOUNTER — Ambulatory Visit (INDEPENDENT_AMBULATORY_CARE_PROVIDER_SITE_OTHER): Payer: Medicare Other | Admitting: Internal Medicine

## 2015-11-01 ENCOUNTER — Encounter: Payer: Self-pay | Admitting: Internal Medicine

## 2015-11-01 VITALS — BP 170/70 | HR 67 | Temp 98.1°F | Ht 65.0 in | Wt 171.0 lb

## 2015-11-01 DIAGNOSIS — I701 Atherosclerosis of renal artery: Secondary | ICD-10-CM

## 2015-11-01 DIAGNOSIS — R634 Abnormal weight loss: Secondary | ICD-10-CM | POA: Diagnosis not present

## 2015-11-01 DIAGNOSIS — I890 Lymphedema, not elsewhere classified: Secondary | ICD-10-CM | POA: Diagnosis not present

## 2015-11-01 DIAGNOSIS — I10 Essential (primary) hypertension: Secondary | ICD-10-CM | POA: Diagnosis not present

## 2015-11-01 DIAGNOSIS — C50919 Malignant neoplasm of unspecified site of unspecified female breast: Secondary | ICD-10-CM

## 2015-11-01 DIAGNOSIS — I871 Compression of vein: Secondary | ICD-10-CM

## 2015-11-01 DIAGNOSIS — Z08 Encounter for follow-up examination after completed treatment for malignant neoplasm: Secondary | ICD-10-CM

## 2015-11-01 DIAGNOSIS — Z85118 Personal history of other malignant neoplasm of bronchus and lung: Secondary | ICD-10-CM

## 2015-11-01 DIAGNOSIS — F17201 Nicotine dependence, unspecified, in remission: Secondary | ICD-10-CM

## 2015-11-01 NOTE — Progress Notes (Signed)
Subjective:    Patient ID: Donna May, female    DOB: 31-Mar-1940, 75 y.o.   MRN: [id number]  HPI 75 year old patient who is seen today complaining of persistent swelling involving the right arm.  This has been present for approximately 3 weeks.  She has remote history of right breast cancer treated with mastectomy mammoplasty with lymph node dissection but no radiation treatment. Evaluation has included gynecologic evaluation with breast exam.  Mammogram and ultrasound of the right breast.  Screening laboratory studies have also been done She has remote history tobacco use and smoked from age 36 through age 19, fairly low volume.  Lab Results  Component Value Date   HGBA1C 6.9 (A) 01/28/2015     Past Medical History:  Diagnosis Date  . Anemia   . Arthritis   . Breast cancer ([place]) 1992  . Diabetes mellitus    type 2  . Duodenitis 01/18/2002  . Fainting spell   . GERD (gastroesophageal reflux disease)   . Heart murmur   . Hiatal hernia 08/08/2008, 01/18/2002  . History of pneumonia   . Hyperlipidemia   . Hypertension   . UTI (lower urinary tract infection)      Social History   Social History  . Marital status: Single    Spouse name: N/A  . Number of children: 2  . Years of education: N/A   Occupational History  . retired    Social History Main Topics  . Smoking status: Former Research scientist (life sciences)  . Smokeless tobacco: Never Used  . Alcohol use 0.0 oz/week     Comment: occ  . Drug use: No  . Sexual activity: Not on file   Other Topics Concern  . Not on file   Social History Narrative  . No narrative on file    Past Surgical History:  Procedure Laterality Date  . AXILLARY SURGERY     cyst removal, right  . MASTECTOMY  1992   right, with flap  . NM MYOCAR PERF WALL MOTION  06/11/2009   Protocol:Bruce, post stress EF58%, EKG negative for ischemia, low risk  . TONSILLECTOMY    . TRANSTHORACIC ECHOCARDIOGRAM  12/24/2009   LVEF =>55%, normal study    Family  History  Problem Relation Age of Onset  . Breast cancer Cousin   . Diabetes Brother   . Heart disease Mother   . Hypertension Mother   . Heart disease Father   . Hypertension Father   . Prostate cancer Brother   . Heart attack Maternal Grandmother   . Stroke Maternal Grandfather     No Known Allergies  Current Outpatient Prescriptions on File Prior to Visit  Medication Sig Dispense Refill  . AMBULATORY NON FORMULARY MEDICATION Place 0.0125 g rectally 2 (two) times daily. Medication Name: Nitroglycerin ointment .[id number]. Apply 2-3 times daily as needed. 1 Tube 1  . amLODipine (NORVASC) 10 MG tablet TAKE 1 TABLET (10 MG TOTAL) BY MOUTH DAILY. NEED OFFICE VISIT 90 tablet 3  . aspirin 81 MG tablet Take 81 mg by mouth daily.    Marland Kitchen CALCIUM-MAGNESIUM-VITAMIN D PO Take 1 tablet by mouth daily.    . Cholecalciferol (VITAMIN D3) 2000 UNITS TABS Take 1 tablet by mouth daily.    . CRESTOR 20 MG tablet Take 1 tablet (20 mg total) by mouth daily. 90 tablet 0  . Echinacea 400 MG CAPS Take 1 capsule by mouth daily.    Marland Kitchen EVISTA 60 MG tablet Take 60 mg by mouth daily.     Marland Kitchen  JANUVIA 100 MG tablet Take 100 mg by mouth daily.     . lansoprazole (PREVACID) 30 MG capsule TAKE 1 CAPSULE DAILY BEFOREBREAKFAST 90 capsule 1  . metFORMIN (GLUCOPHAGE-XR) 500 MG 24 hr tablet Take 500 mg by mouth 2 (two) times daily before a meal.     . metoprolol succinate (TOPROL-XL) 100 MG 24 hr tablet Take 1 tablet (100 mg total) by mouth daily. Take with or immediately following a meal. 90 tablet 1  . Multiple Vitamins-Minerals (CENTRUM SILVER ULTRA WOMENS) TABS Take 1 tablet by mouth daily.    . Omega 3 1000 MG CAPS Take 1 capsule by mouth daily.    . valsartan-hydrochlorothiazide (DIOVAN-HCT) 320-12.5 MG tablet TAKE 1 TABLET BY MOUTH DAILY. NEED OV. 90 tablet 1   Current Facility-Administered Medications on File Prior to Visit  Medication Dose Route Frequency Provider Last Rate Last Dose  . triamcinolone acetonide (KENALOG)  10 MG/ML injection 10 mg  10 mg Other Once [employer], DPM        BP (!) 170/70 (BP Location: Left Arm, Patient Position: Sitting, Cuff Size: Normal)   Pulse 67   Temp 98.1 F (36.7 C) (Oral)   Ht 5\' 5"  (1.651 m)   Wt 171 lb (77.6 kg)   SpO2 94%   BMI 28.46 kg/m      Review of Systems  Constitutional: Negative.   HENT: Negative for congestion, dental problem, hearing loss, rhinorrhea, sinus pressure, sore throat and tinnitus.   Eyes: Negative for pain, discharge and visual disturbance.  Respiratory: Negative for cough and shortness of breath.   Cardiovascular: Negative for chest pain, palpitations and leg swelling.  Gastrointestinal: Negative for abdominal distention, abdominal pain, blood in stool, constipation, diarrhea, nausea and vomiting.  Genitourinary: Negative for difficulty urinating, dysuria, flank pain, frequency, hematuria, pelvic pain, urgency, vaginal bleeding, vaginal discharge and vaginal pain.  Musculoskeletal: Negative for arthralgias, gait problem and joint swelling.  Skin: Negative for rash.       Three-week history of swelling involving the right arm  Neurological: Negative for dizziness, syncope, speech difficulty, weakness, numbness and headaches.  Hematological: Negative for adenopathy.  Psychiatric/Behavioral: Negative for agitation, behavioral problems and dysphoric mood. The patient is not nervous/anxious.        Objective:   Physical Exam  Constitutional: She is oriented to person, place, and time. She appears well-developed and well-nourished.  HENT:  Head: Normocephalic.  Right Ear: External ear normal.  Left Ear: External ear normal.  Mouth/Throat: Oropharynx is clear and moist.  Eyes: Conjunctivae and EOM are normal. Pupils are equal, round, and reactive to light.  Neck: Normal range of motion. Neck supple. No thyromegaly present.  No lymphadenopathy  Cardiovascular: Normal rate, regular rhythm, normal heart sounds and intact distal  pulses.   Pulmonary/Chest: Effort normal and breath sounds normal.  Abdominal: Soft. Bowel sounds are normal. She exhibits no mass. There is no tenderness.  Musculoskeletal: Normal range of motion.  Lymphadenopathy:    She has no cervical adenopathy.  Neurological: She is alert and oriented to person, place, and time.  Skin: Skin is warm and dry. No rash noted.  Lymphedema of the right arm  Psychiatric: She has a normal mood and affect. Her behavior is normal.          Assessment & Plan:    right arm lymphedema.  Probably secondary to remote lymph node dissection but unusual to have this complication this late.  Will check a chest CT for completeness  diabetes  mellitus.  Follow-up 3 months , remote history of right breast cancer 1992 with mastectomy and right axillary lymph node dissection  .  Follow-up 3 months or as needed  Nyoka Cowden, MD

## 2015-11-01 NOTE — Patient Instructions (Addendum)
Chest CT scan as discussed  Keep right arm elevated as much as possibleLymphedema Lymphedema is swelling that is caused by the abnormal collection of lymph under the skin. Lymph is fluid from the tissues in your body that travels in the lymphatic system. This system is part of the immune system and includes lymph nodes and lymph vessels. The lymph vessels collect and carry the excess fluid, fats, proteins, and wastes from the tissues of the body to the bloodstream. This system also works to clean and remove bacteria and waste products from the body. Lymphedema occurs when the lymphatic system is blocked. When the lymph vessels or lymph nodes are blocked or damaged, lymph does not drain properly, causing an abnormal buildup of lymph. This leads to swelling in the arms or legs. Lymphedema cannot be cured by medicines, but various methods can be used to help reduce the swelling. CAUSES There are two types of lymphedema. Primary lymphedema is caused by the absence or abnormality of the lymph vessel at birth. Secondary lymphedema is more common. It occurs when the lymph vessel is damaged or blocked. Common causes of lymph vessel blockage include:  Skin infection, such as cellulitis.  Infection by parasites (filariasis).  Injury.  Cancer.  Radiation therapy.  Formation of scar tissue.  Surgery. SYMPTOMS Symptoms of this condition include:  Swelling of the arm or leg.  A heavy or tight feeling in the arm or leg.  Swelling of the feet, toes, or fingers. Shoes or rings may fit more tightly than before.  Redness of the skin over the affected area.  Limited movement of the affected limb.  Sensitivity to touch or discomfort in the affected limb. DIAGNOSIS This condition may be diagnosed with:  A physical exam.  Medical history.  Imaging tests, such as:  Lymphoscintigraphy. In this test, a low dose of a radioactive substance is injected to trace the flow of lymph through the lymph  vessels.  MRI.  CT scan.  Duplex ultrasound. This test uses sound waves to produce images of the vessels and the blood flow on a screen.  Lymphangiography. In this test, a contrast dye is injected into the lymph vessel to help show blockages. TREATMENT Treatment for this condition may depend on the cause. Treatment may include:  Exercise. Certain exercises can help fluid move out of the affected limb.  Massage. Gentle massage of the affected limb can help move the fluid out of the area.  Compression. Various methods may be used to apply pressure to the affected limb in order to reduce the swelling.  Wearing compression stockings or sleeves on the affected limb.  Bandaging the affected limb.  Using an external pump that is attached to a sleeve that alternates between applying pressure and releasing pressure.  Surgery. This is usually only done for severe cases. For example, surgery may be done if you have trouble moving the limb or if the swelling does not get better with other treatments. If an underlying condition is causing the lymphedema, treatment for that condition is needed. For example, antibiotic medicines may be used to treat an infection. HOME CARE INSTRUCTIONS Activities  Exercise regularly as directed by your health care provider.  Do not sit with your legs crossed.  When possible, keep the affected limb raised (elevated) above the level of your heart.  Avoid carrying things with an arm that is affected by lymphedema.  Remember that the affected area is more likely to become injured or infected.  Take these steps to  help prevent infection:  Keep the affected area clean and dry.  Protect your skin from cuts. For example, you should use gloves while cooking or gardening. Do not walk barefoot. If you shave the affected area, use an Copy. General Instructions  Take medicines only as directed by your health care provider.  Eat a healthy diet that  includes a lot of fruits and vegetables.  Do not wear tight clothes, shoes, or jewelry.  Do not use heating pads over the affected area.  Avoid having blood pressure checked on the affected limb.  Keep all follow-up visits as directed by your health care provider. This is important. SEEK MEDICAL CARE IF:  You continue to have swelling in your limb.  You have a fever.  You have a cut that does not heal.  You have redness or pain in the affected area.  You have new swelling in your limb that comes on suddenly.  You develop purplish spots or sores (lesions) on your limb. SEEK IMMEDIATE MEDICAL CARE IF:  You have a skin rash.  You have chills or sweats.  You have shortness of breath.   This information is not intended to replace advice given to you by your health care provider. Make sure you discuss any questions you have with your health care provider.   Document Released: 12/28/2006 Document Revised: 07/17/2014 Document Reviewed: 02/07/2014 Elsevier Interactive Patient Education [employer].

## 2015-11-06 ENCOUNTER — Telehealth: Payer: Self-pay | Admitting: Internal Medicine

## 2015-11-06 DIAGNOSIS — F17201 Nicotine dependence, unspecified, in remission: Secondary | ICD-10-CM

## 2015-11-06 DIAGNOSIS — C50919 Malignant neoplasm of unspecified site of unspecified female breast: Secondary | ICD-10-CM

## 2015-11-06 NOTE — Telephone Encounter (Signed)
Pt has venous ultrasound on 8/15 and needs there results and also dr Raliegh Ip wanted chest ct but their is no order in system

## 2015-11-08 NOTE — Telephone Encounter (Signed)
Spoke to pt, told her Ultrasound was normal and that I order the CT Chest and someone will be contacting you to schedule appt. Pt verbalized understanding.

## 2015-11-13 ENCOUNTER — Telehealth: Payer: Self-pay | Admitting: Oncology

## 2015-11-13 ENCOUNTER — Other Ambulatory Visit: Payer: Self-pay | Admitting: Oncology

## 2015-11-13 ENCOUNTER — Encounter: Payer: Self-pay | Admitting: Oncology

## 2015-11-13 DIAGNOSIS — C50919 Malignant neoplasm of unspecified site of unspecified female breast: Secondary | ICD-10-CM

## 2015-11-13 DIAGNOSIS — C50912 Malignant neoplasm of unspecified site of left female breast: Secondary | ICD-10-CM

## 2015-11-13 NOTE — Telephone Encounter (Signed)
Lab appt scheduled for 9/5 at 9:15am and 9/12 at 430pm w/GM. Patient agreed. Made her aware that she will receive a separate phone call to schedule imaging appts. She voiced understanding. Demographics verified. Letter to the referring.

## 2015-11-13 NOTE — Telephone Encounter (Signed)
lvm to inform pt of [facility] appt 9/12 at 815 am per scheduler

## 2015-11-14 ENCOUNTER — Other Ambulatory Visit: Payer: Self-pay | Admitting: *Deleted

## 2015-11-15 ENCOUNTER — Other Ambulatory Visit: Payer: Self-pay | Admitting: *Deleted

## 2015-11-15 DIAGNOSIS — C50919 Malignant neoplasm of unspecified site of unspecified female breast: Secondary | ICD-10-CM

## 2015-11-18 ENCOUNTER — Other Ambulatory Visit: Payer: Self-pay | Admitting: Oncology

## 2015-11-19 ENCOUNTER — Other Ambulatory Visit (HOSPITAL_BASED_OUTPATIENT_CLINIC_OR_DEPARTMENT_OTHER): Payer: Medicare Other

## 2015-11-19 DIAGNOSIS — C50919 Malignant neoplasm of unspecified site of unspecified female breast: Secondary | ICD-10-CM

## 2015-11-19 LAB — CBC WITH DIFFERENTIAL/PLATELET
BASO%: 0.5 % (ref 0.0–2.0)
BASOS ABS: 0 10*3/uL (ref 0.0–0.1)
EOS%: 4.5 % (ref 0.0–7.0)
Eosinophils Absolute: 0.3 10*3/uL (ref 0.0–0.5)
HEMATOCRIT: 39.8 % (ref 34.8–46.6)
HEMOGLOBIN: 12.7 g/dL (ref 11.6–15.9)
LYMPH#: 2 10*3/uL (ref 0.9–3.3)
LYMPH%: 32.5 % (ref 14.0–49.7)
MCH: 27.8 pg (ref 25.1–34.0)
MCHC: 31.9 g/dL (ref 31.5–36.0)
MCV: 87.3 fL (ref 79.5–101.0)
MONO#: 0.6 10*3/uL (ref 0.1–0.9)
MONO%: 9.4 % (ref 0.0–14.0)
NEUT#: 3.3 10*3/uL (ref 1.5–6.5)
NEUT%: 53.1 % (ref 38.4–76.8)
PLATELETS: 222 10*3/uL (ref 145–400)
RBC: 4.56 10*6/uL (ref 3.70–5.45)
RDW: 15.9 % — ABNORMAL HIGH (ref 11.2–14.5)
WBC: 6.1 10*3/uL (ref 3.9–10.3)

## 2015-11-19 LAB — COMPREHENSIVE METABOLIC PANEL
ALT: 14 U/L (ref 0–55)
ANION GAP: 9 meq/L (ref 3–11)
AST: 20 U/L (ref 5–34)
Albumin: 3.6 g/dL (ref 3.5–5.0)
Alkaline Phosphatase: 59 U/L (ref 40–150)
BUN: 14.4 mg/dL (ref 7.0–26.0)
CALCIUM: 9.8 mg/dL (ref 8.4–10.4)
CHLORIDE: 108 meq/L (ref 98–109)
CO2: 27 meq/L (ref 22–29)
Creatinine: 1.2 mg/dL — ABNORMAL HIGH (ref 0.6–1.1)
EGFR: 51 mL/min/{1.73_m2} — ABNORMAL LOW (ref >90)
Glucose: 109 mg/dl (ref 70–140)
POTASSIUM: 4.8 meq/L (ref 3.5–5.1)
Sodium: 144 mEq/L (ref 136–145)
Total Bilirubin: 0.41 mg/dL (ref 0.20–1.20)
Total Protein: 7.2 g/dL (ref 6.4–8.3)

## 2015-11-19 LAB — DRAW EXTRA CLOT TUBE

## 2015-11-20 ENCOUNTER — Telehealth: Payer: Self-pay | Admitting: *Deleted

## 2015-11-20 ENCOUNTER — Other Ambulatory Visit: Payer: Self-pay | Admitting: Oncology

## 2015-11-20 NOTE — Telephone Encounter (Signed)
Received call from [facility] asking about order for mammogram & questions whether she needs this b/c screening mammo done at [facility] which was normal.  Discussed with Dr Jana Hakim & cancelled mammo.

## 2015-11-21 ENCOUNTER — Encounter: Payer: Self-pay | Admitting: Internal Medicine

## 2015-11-22 ENCOUNTER — Telehealth: Payer: Self-pay

## 2015-11-22 NOTE — Telephone Encounter (Signed)
S/w Dr Jana Hakim and called pt we will keep NP appt 9/12 before bone scan.

## 2015-11-22 NOTE — Telephone Encounter (Signed)
Pt asking if MD appt should be before or after Bone Scan.

## 2015-11-26 ENCOUNTER — Ambulatory Visit (HOSPITAL_BASED_OUTPATIENT_CLINIC_OR_DEPARTMENT_OTHER): Payer: Medicare Other | Admitting: Oncology

## 2015-11-26 VITALS — BP 197/79 | HR 81 | Temp 99.0°F | Resp 18 | Ht 65.0 in | Wt 175.2 lb

## 2015-11-26 DIAGNOSIS — Z853 Personal history of malignant neoplasm of breast: Secondary | ICD-10-CM

## 2015-11-26 DIAGNOSIS — D0511 Intraductal carcinoma in situ of right breast: Secondary | ICD-10-CM

## 2015-11-26 DIAGNOSIS — I890 Lymphedema, not elsewhere classified: Secondary | ICD-10-CM

## 2015-11-26 NOTE — Progress Notes (Signed)
Donna May  [phone] [phone]     ID: Donna May DOB: Dec 12, 77 1942  MR#: [id number]  TDD#:[id number]  Patient Care Team: Donna Lor, MD as PCP - General (Internal Medicine) Donna Cruel, MD as Consulting Physician (Oncology) Donna Klein, MD as Consulting Physician (Cardiology) Donna Bruins, MD as Consulting Physician (Obstetrics and Gynecology) OTHER MD:  CHIEF COMPLAINT: Right upper extremity lymphedema and a history of breast cancer  CURRENT TREATMENT: Under evaluation   BREAST CANCER HISTORY: Donna May remembers the single visit I paId her after her March 1992 mastectomy and axillary lymph node dissection under [name]. She recalls that I told her she had a noninvasive tumor and that she was very likely cured. She required no follow-up and no antiestrogen therapy.  In fact she did well until early August when she noted some swelling in her right upper extremity. She already had planned a trip to [place] for medication and she did fly to [place] and back. After her return she brought the right upper extremity problem (which had somewhat worsened with the flights) to medical attention, and she was set up for a Doppler ultrasound of the right arm, which was performed 10/29/2015. This showed no evidence of thrombus in the deep or superficial veins of the right upper extremity.  She had bilateral screening mammography when [facility] 10/29/2015 and this showed no significant findings. The patient was referred to [facility] where on 10/31/2015 she had right axillary ultrasonography. This showed a lymph node with lobulated margins in the right axilla which was felt to be probably benign. Ultrasound in 6 months was recommended.   We were then contacted for further evaluation and set the patient up for chest CT scan and bone scan, which have not yet been performed.   INTERVAL HISTORY: Normal was evaluated in the breast clinic  11/26/2015  REVIEW OF SYSTEMS: Aside from the right upper extremity lymphedema and she tells me that have been no new or unusual symptoms. She has gained some weight. Sometimes her eyes hurt. She has ringing in her ears. She has a history of palpitations and ankle swelling but this is not more persistent or symptomatic than before. She has chronic back and joint pains and leg cramps. She feels depressed. Her diabetes is moderately well controlled. A detailed review of systems today was otherwise stable  PAST MEDICAL HISTORY: Past Medical History:  Diagnosis Date  . Anemia   . Arthritis   . Breast cancer ([place]) 1992  . Diabetes mellitus    type 2  . Duodenitis 01/18/2002  . Fainting spell   . GERD (gastroesophageal reflux disease)   . Heart murmur   . Hiatal hernia 08/08/2008, 01/18/2002  . History of pneumonia   . Hyperlipidemia   . Hypertension   . UTI (lower urinary tract infection)     PAST SURGICAL HISTORY: Past Surgical History:  Procedure Laterality Date  . AXILLARY SURGERY     cyst removal, right  . MASTECTOMY  1992   right, with flap  . NM MYOCAR PERF WALL MOTION  06/11/2009   Protocol:Bruce, post stress EF58%, EKG negative for ischemia, low risk  . TONSILLECTOMY    . TRANSTHORACIC ECHOCARDIOGRAM  12/24/2009   LVEF =>55%, normal study    FAMILY HISTORY Family History  Problem Relation Age of Onset  . Breast cancer Cousin   . Diabetes Brother   . Heart disease Mother   . Hypertension Mother   . Heart disease Father   .  Hypertension Father   . Prostate cancer Brother   . Heart attack Maternal Grandmother   . Stroke Maternal Grandfather   The patient's father died at age 51. He had significant heart disease. The patient's mother died at age 30 with a history of multiple sclerosis. The patient had 2 brothers, no sisters. One brother died with heart failure at age 41. The other brother has a history of prostate cancer.  GYNECOLOGIC HISTORY:  No LMP recorded.  Patient is postmenopausal. Menarche age 41 first live birth age 3 the patient is [place] P2. She stopped having periods at the time of her surgery March 1992. She did not use hormone replacement. 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 She describes herself as single and lives alone with no pets. Her son Donna May is Leisure centre manager of police here in [place] and her daughter Donna May is an Quarry manager also in [place]. The patient has 4 granddaughters and 1 grandson. She attends a [employer] DIRECTIVES: Not in place but the patient tells me she intends to name her son [name] as her healthcare part of attorney. His cell number is [phone]; his work pager number is [phone], and his home [phone].   HEALTH MAINTENANCE: Social History  Substance Use Topics  . Smoking status: Former Research scientist (life sciences)  . Smokeless tobacco: Never Used  . Alcohol use 0.0 oz/week     Comment: occ     Colonoscopy:  PAP:  Bone density:   No Known Allergies  Current Outpatient Prescriptions  Medication Sig Dispense Refill  . AMBULATORY NON FORMULARY MEDICATION Place 0.0125 g rectally 2 (two) times daily. Medication Name: Nitroglycerin ointment .5009. Apply 2-3 times daily as needed. 1 Tube 1  . amLODipine (NORVASC) 10 MG tablet TAKE 1 TABLET (10 MG TOTAL) BY MOUTH DAILY. NEED OFFICE VISIT 90 tablet 3  . aspirin 81 MG tablet Take 81 mg by mouth daily.    Marland Kitchen CALCIUM-MAGNESIUM-VITAMIN D PO Take 1 tablet by mouth daily.    . Cholecalciferol (VITAMIN D3) 2000 UNITS TABS Take 1 tablet by mouth daily.    . CRESTOR 20 MG tablet Take 1 tablet (20 mg total) by mouth daily. 90 tablet 0  . Echinacea 400 MG CAPS Take 1 capsule by mouth daily.    Marland Kitchen EVISTA 60 MG tablet Take 60 mg by mouth daily.     Marland Kitchen JANUVIA 100 MG tablet Take 100 mg by mouth  daily.     . lansoprazole (PREVACID) 30 MG capsule TAKE 1 CAPSULE DAILY BEFOREBREAKFAST 90 capsule 1  . metFORMIN (GLUCOPHAGE-XR) 500 MG 24 hr tablet Take 500 mg by mouth 2 (two) times daily before a meal.     . metoprolol succinate (TOPROL-XL) 100 MG 24 hr tablet Take 1 tablet (100 mg total) by mouth daily. Take with or immediately following a meal. 90 tablet 1  . Multiple Vitamins-Minerals (CENTRUM SILVER ULTRA WOMENS) TABS Take 1 tablet by mouth daily.    . Omega 3 1000 MG CAPS Take 1 capsule by mouth daily.    . valsartan-hydrochlorothiazide (DIOVAN-HCT) 320-12.5 MG tablet TAKE 1 TABLET BY MOUTH DAILY. NEED OV. 90 tablet 1   Current Facility-Administered Medications  Medication Dose Route Frequency Provider Last Rate Last Dose  . triamcinolone acetonide (KENALOG) 10 MG/ML injection 10 mg  10 mg Other Once Harriet Masson, DPM  OBJECTIVE: Older African-American woman in no acute distress Vitals:   11/26/15 1611  BP: (!) 197/79  Pulse: 81  Resp: 18  Temp: 99 F (37.2 C)     Body mass index is 29.15 kg/m.    ECOG FS:1 - Symptomatic but completely ambulatory  Ocular: Sclerae unicteric, pupils equal, round and reactive to light Ear-nose-throat: Oropharynx clear and moist Lymphatic: No cervical or supraclavicular adenopathy Lungs no rales or rhonchi, good excursion bilaterally Heart regular rate and rhythm, no murmur appreciated Abd soft, nontender, positive bowel sounds MSK no focal spinal tenderness, grade 2 right upper extremity lymphedema, no erythema Neuro: non-focal, well-oriented, appropriate affect Breasts: The right breast is status post mastectomy and status post reconstruction. There is no evidence of local recurrence. The right axilla is benign. The left breast is unremarkable.   LAB RESULTS:  CMP     Component Value Date/Time   NA 144 11/19/2015 0921   K 4.8 11/19/2015 0921   CL 105 12/01/2012 1105   CO2 27 11/19/2015 0921   GLUCOSE 109 11/19/2015 0921    BUN 14.4 11/19/2015 0921   CREATININE 1.2 (H) 11/19/2015 0921   CALCIUM 9.8 11/19/2015 0921   PROT 7.2 11/19/2015 0921   ALBUMIN 3.6 11/19/2015 0921   AST 20 11/19/2015 0921   ALT 14 11/19/2015 0921   ALKPHOS 59 11/19/2015 0921   BILITOT 0.41 11/19/2015 0921    INo results found for: SPEP, UPEP  Lab Results  Component Value Date   WBC 6.1 11/19/2015   NEUTROABS 3.3 11/19/2015   HGB 12.7 11/19/2015   HCT 39.8 11/19/2015   MCV 87.3 11/19/2015   PLT 222 11/19/2015      Chemistry      Component Value Date/Time   NA 144 11/19/2015 0921   K 4.8 11/19/2015 0921   CL 105 12/01/2012 1105   CO2 27 11/19/2015 0921   BUN 14.4 11/19/2015 0921   CREATININE 1.2 (H) 11/19/2015 0921   GLU 97 01/28/2015      Component Value Date/Time   CALCIUM 9.8 11/19/2015 0921   ALKPHOS 59 11/19/2015 0921   AST 20 11/19/2015 0921   ALT 14 11/19/2015 0921   BILITOT 0.41 11/19/2015 0921       No results found for: LABCA2  No components found for: LABCA125  No results for input(s): INR in the last 168 hours.  Urinalysis No results found for: COLORURINE, APPEARANCEUR, LABSPEC, PHURINE, GLUCOSEU, HGBUR, BILIRUBINUR, KETONESUR, PROTEINUR, UROBILINOGEN, NITRITE, LEUKOCYTESUR   STUDIES: No results found.  ELIGIBLE FOR AVAILABLE RESEARCH PROTOCOL: NO  ASSESSMENT: 75 y.o. [place] woman  (1) Status post right mastectomy and axillary lymph node dissection March 1992 for what by the patient's recollection was a noninvasive breast cancer  (2) right upper extremity lymphedema developing August 2017  (a) right upper extremity Doppler ultrasonography 10/29/2015 shows no evidence of clot  (b) right axillary ultrasonography [facility] 10/31/2015 shows a single likely benign lymph node  (c) chest CT and bone scan pending  PLAN: We spent the better part of today's hour-long appointment going over her normal is unusual situation. In general, upper extremity lymphedema after axillary lymph node  dissection is not uncommon. What is uncommon is to develop this 25 years after the procedure.  There was no obvious precipitating cause. The lymphedema had already occurred before the airplane trip to [place] although that likely made it somewhat worse.  It is reassuring that we do not see evidence of a clot or right axillary tumor recurrence.  She gives me no systemic symptoms to suggest metastatic disease and her lab work is unremarkable.  I expect the CT scan of the chest and bone scan not to show metastatic disease. In that case we are going to be left with the diagnosis of scarring somehow developing very slowly over a period of 25 years eventually resulting in right upper extremity lymphedema.  She understands that in general right upper extremity lymphedema is chronic. Surgery to restore lymph drainage is experimental. The single patient I have had undergone that procedure received no benefit from it. Standard treatment consists of physical therapy and compression sleeves.  I gave her a prescription for a compression sleeve and glove and I put in a referral to the physical therapy team so she can be instructed in manual massage and optimal control of this likely chronic problem.  I will call her with the results of the pending tests, but I would prefer to see her about 3 weeks from now but which time she should have seen physical therapy and obtained her compression sleeve. We can then discuss the management of lymphedema again in more detail.  Of course if there is evidence of cancer or other more specific diagnosis we would proceed to further evaluation with that in mind  She has a good understanding of this plan. She agrees with it. She will call with any problems that may develop before her return visit here.    Donna Cruel, MD   11/26/2015 6:11 PM Medical Oncology and Hematology [facility] [address] [place], [place] [address] Tel. [phone]    Fax.  [phone]

## 2015-12-02 ENCOUNTER — Encounter (HOSPITAL_COMMUNITY): Payer: Medicare Other

## 2015-12-02 ENCOUNTER — Ambulatory Visit (HOSPITAL_COMMUNITY)
Admission: RE | Admit: 2015-12-02 | Discharge: 2015-12-02 | Disposition: A | Discharge status: Home / Self Care | Payer: Medicare Other | Source: Ambulatory Visit | Attending: Oncology | Admitting: Oncology

## 2015-12-02 ENCOUNTER — Encounter (HOSPITAL_COMMUNITY)
Admission: RE | Admit: 2015-12-02 | Discharge: 2015-12-02 | Disposition: A | Discharge status: Home / Self Care | Payer: Medicare Other | Source: Ambulatory Visit | Attending: Oncology | Admitting: Oncology

## 2015-12-02 ENCOUNTER — Encounter (HOSPITAL_COMMUNITY): Payer: Self-pay

## 2015-12-02 DIAGNOSIS — C50919 Malignant neoplasm of unspecified site of unspecified female breast: Secondary | ICD-10-CM

## 2015-12-02 DIAGNOSIS — I700 Atherosclerosis of aorta: Secondary | ICD-10-CM | POA: Insufficient documentation

## 2015-12-02 DIAGNOSIS — Z853 Personal history of malignant neoplasm of breast: Secondary | ICD-10-CM | POA: Diagnosis not present

## 2015-12-02 DIAGNOSIS — R932 Abnormal findings on diagnostic imaging of liver and biliary tract: Secondary | ICD-10-CM | POA: Insufficient documentation

## 2015-12-02 DIAGNOSIS — I2510 Atherosclerotic heart disease of native coronary artery without angina pectoris: Secondary | ICD-10-CM | POA: Diagnosis not present

## 2015-12-02 DIAGNOSIS — C50912 Malignant neoplasm of unspecified site of left female breast: Secondary | ICD-10-CM | POA: Diagnosis not present

## 2015-12-02 MED ORDER — IOPAMIDOL (ISOVUE-300) INJECTION 61%
75.0000 mL | Freq: Once | INTRAVENOUS | Status: AC | PRN
  Administered 2015-12-02: 75 mL via INTRAVENOUS

## 2015-12-04 ENCOUNTER — Ambulatory Visit: Payer: Medicare Other | Attending: Oncology | Admitting: Physical Therapy

## 2015-12-04 DIAGNOSIS — I972 Postmastectomy lymphedema syndrome: Secondary | ICD-10-CM | POA: Diagnosis not present

## 2015-12-04 DIAGNOSIS — M25511 Pain in right shoulder: Secondary | ICD-10-CM | POA: Insufficient documentation

## 2015-12-04 NOTE — Therapy (Signed)
[place], [place], [address] Phone: [phone]   Fax:  [phone]  Physical Therapy Evaluation  Patient Details  Name: Donna May MRN: [id number] Date of Birth: March 23, 1940 Referring Provider: Lurline Del, MD  Encounter Date: 12/04/2015      PT End of Session - 12/04/15 1120    Visit Number 1   Number of Visits 19   Date for PT Re-Evaluation 01/17/16   PT Start Time 1019   PT Stop Time 1106   PT Time Calculation (min) 47 min   Activity Tolerance Patient tolerated treatment well   Behavior During Therapy [facility] for tasks assessed/performed      Past Medical History:  Diagnosis Date  . Anemia   . Arthritis   . Breast cancer ([place]) 1992  . Diabetes mellitus    type 2  . Duodenitis 01/18/2002  . Fainting spell   . GERD (gastroesophageal reflux disease)   . Heart murmur   . Hiatal hernia 08/08/2008, 01/18/2002  . History of pneumonia   . Hyperlipidemia   . Hypertension   . UTI (lower urinary tract infection)     Past Surgical History:  Procedure Laterality Date  . AXILLARY SURGERY     cyst removal, right  . MASTECTOMY  1992   right, with flap  . NM MYOCAR PERF WALL MOTION  06/11/2009   Protocol:Bruce, post stress EF58%, EKG negative for ischemia, low risk  . TONSILLECTOMY    . TRANSTHORACIC ECHOCARDIOGRAM  12/24/2009   LVEF =>55%, normal study    There were no vitals filed for this visit.       Subjective Assessment - 12/04/15 1026    Subjective My arm starting to swell, so my doctor sent me to physical therapy to show me how to massage my arm. I've needed right shoulder surgery for a while, I haven't had that done yet because I know how painful that is. I was doing well after my cancer surgery in 1992 until the beginning of August, which is when my arm started to swell up.   Pertinent History Patient with a history of right mastectomy and axillary lymph node dissection in  March of 1992, patient states she thinks about 5 lymph nodes were removed. She noticed swelling in her right UE at the beginning of August 2017.   Patient Stated Goals to get the arm back down   Currently in Pain? No/denies  just a little tightness            [facility] PT Assessment - 12/04/15 0001      Assessment   Medical Diagnosis right breast cancer   Referring Provider Lurline Del, MD   Onset Date/Surgical Date 05/15/90   Hand Dominance Right   Prior Therapy none     Restrictions   Weight Bearing Restrictions No     Balance Screen   Has the patient fallen in the past 6 months No   Has the patient had a decrease in activity level because of a fear of falling?  No   Is the patient reluctant to leave their home because of a fear of falling?  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   Leisure no regular exercise     Cognition   Overall Cognitive Status Within Functional Limits for tasks assessed     Observation/Other Assessments   Observations noticeable swelling in right UE   Other Surveys  --  LLIS: 29% impairment     Posture/Postural Control   Posture/Postural Control Postural limitations   Postural Limitations Rounded Shoulders;Forward head     ROM / Strength   AROM / PROM / Strength AROM     AROM   Overall AROM Comments bilateral shoulder AROM grossly WFL     Palpation   Palpation comment firmness at the outer portion of right upper arm     Ambulation/Gait   Ambulation/Gait Yes   Ambulation/Gait Assistance 7: Independent           LYMPHEDEMA/ONCOLOGY QUESTIONNAIRE - 12/04/15 1037      Lymphedema Assessments    Lymphedema Assessments Upper extremities     Right Upper Extremity Lymphedema   15 cm Proximal to Olecranon Process 36.4 cm   10 cm Proximal to Olecranon Process 36.9 cm   Olecranon Process 32.4 cm   15 cm Proximal to Ulnar Styloid Process 28.4 cm   10 cm Proximal to Ulnar Styloid Process 24.9 cm   Just Proximal to Ulnar Styloid Process 19.4 cm   Across Hand at [employer] 20.2 cm   At [place] of 2nd Digit 6.9 cm     Left Upper Extremity Lymphedema   15 cm Proximal to Olecranon Process 31.3 cm   10 cm Proximal to Olecranon Process 30.3 cm   Olecranon Process 26.5 cm   15 cm Proximal to Ulnar Styloid Process 22.2 cm   10 cm Proximal to Ulnar Styloid Process 19 cm   Just Proximal to Ulnar Styloid Process 16.8 cm   Across Hand at [employer] 18.9 cm   At [place] of 2nd Digit 6.9 cm                           Short Term Clinic Goals - 12/04/15 1127      CC Short Term Goal  #1   Title Patient will be independent in self manual lymph drainage   Time 3   Period Weeks   Status New     CC Short Term Goal  #2   Title Decrease circumference measurement across hand at thumb webspace to 19.5 cm or less   Baseline 20.2 cm at eval   Time 3   Period Weeks   Status New     CC Short Term Goal  #3   Title Patient will be knowledgeable in lymphedema risk reduction practices.   Time 3   Period Weeks   Status New             Long Term Clinic Goals - 12/04/15 1129      CC Long Term Goal  #1   Title Patient will be knowledgeable about obtaining and wearing compression garments   Time 6   Period Weeks   Status New     CC Long Term Goal  #2   Title Decrease circumference measurement at 10 cm proximal to olecranon to 34.5 cm or less   Baseline 36.9 at eval   Time 6   Period Weeks   Status New     CC Long Term Goal  #  3   Title Patient will be able to don/doff compression garments independently   Time 6   Period Weeks   Status New     CC Long Term Goal   #4   Title Reduce Lymphedema Life Impact score to 15% or less.    Baseline 29% at eval   Time 6   Period Weeks   Status New            Plan - 12/04/15 1121    Clinical Impression Statement Patient is a 75 year old female with a history of right mastectomy and axillary lymph node dissection in March of 1992. At the beginning of August 2017, she noticed swelling in her right UE; tests for blood clot and recurrence of cancer were negative. She demonstrates significantly increased swelling in her right UE compared to the left UE with circumference measurements. She does have pain in her right shoulder intermittently secondary to a rotator cuff injury approximately 25 years ago that she never had surgery for.   Rehab Potential Good   Clinical Impairments Affecting Rehab Potential history of right axillary lymph node dissection (in 1992)   PT Frequency 3x / week   PT Duration 6 weeks   PT Treatment/Interventions ADLs/Self Care Home Management;Therapeutic exercise;DME Instruction;Patient/family education;Manual techniques;Manual lymph drainage;Compression bandaging;Passive range of motion;Taping   PT Next Visit Plan begin MLD and compression bandaging of right UE; educated patient on lymphedema risk reduction when able   Consulted and Agree with Plan of Care Patient      Patient will benefit from skilled therapeutic intervention in order to improve the following deficits and impairments:  Impaired UE functional use, Increased edema, Pain  Visit Diagnosis: Postmastectomy lymphedema  Pain in right shoulder     Problem List Patient Active Problem List   Diagnosis Date Noted  . Ductal carcinoma in situ (DCIS) of right breast 11/26/2015  . Lymphedema of arm 11/26/2015  . Lymphedema 11/01/2015  . Leg pain, bilateral 12/28/2013  . Special screening for malignant neoplasms, colon 08/09/2013  . Renal artery stenosis, native, bilateral ([place]) 06/09/2013  . Diabetes mellitus with renal  complications ([place]) 67/54/4920  . RASH AND OTHER NONSPECIFIC SKIN ERUPTION 07/05/2008  . Malignant neoplasm of female breast ([place]) 07/04/2008  . HYPERCHOLESTEROLEMIA 07/04/2008  . Essential hypertension 07/04/2008  . GERD 07/04/2008  . DUODENITIS, WITHOUT HEMORRHAGE 07/04/2008  . HIATAL HERNIA 07/04/2008  . Arthropathy 07/04/2008    [facility] 12/04/2015, 11:46 AM  [place], [place], [address] Phone: [phone]   Fax:  [phone]  Name: LARIYAH SHETTERLY MRN: [id number] Date of Birth: Nov 24, 1940   Saverio Danker, SPT  This entire session was guided, instructed, and directly supervised by Serafina Royals, PT.  Read, reviewed, edited and agree with student's findings and recommendations.   Serafina Royals, PT 12/04/15 11:20 PM

## 2015-12-09 ENCOUNTER — Ambulatory Visit: Payer: Medicare Other | Admitting: Physical Therapy

## 2015-12-09 DIAGNOSIS — I972 Postmastectomy lymphedema syndrome: Secondary | ICD-10-CM | POA: Diagnosis not present

## 2015-12-09 DIAGNOSIS — M25511 Pain in right shoulder: Secondary | ICD-10-CM | POA: Diagnosis not present

## 2015-12-09 NOTE — Patient Instructions (Signed)
Neck Side-Bending   Begin with chin level, head centered over spine. Slowly lower one ear toward shoulder keeping shoulder down. Hold __3__ seconds. Slowly return to starting position. Repeat to other side.  Repeat __5-10__ times to each side. Do 2-3 times a day. Also stretch by looking over right shoulder, then left.  Scapular Retraction (Standing)   With arms at sides, pinch shoulder blades together.  Hold 3 seconds. Repeat __5-10__ times per set.  Do __2-3__ sessions per day.   Active ROM Flexion    Raise arm to point to ceiling, keeping elbow straight. Hold __3__ seconds. Repeat _5-10___ times. Do _2-3___ sessions per day. Activity: Use this motion to reach for items on overhead shelves.     Flexion (Active)   Palm up, rest elbow on other hand. Bend as far as possible. Hold __3__ seconds. Repeat __5-10__ times. Do _2-3___ sessions per day. Can use other hand to bend elbow further if needed where bandage may limit end motion.  Copyright  VHI. All rights reserved.    Extension (Active With Finger Extension)   Llift hand with fingers straight, then bend wrist down.  Hold _3___ seconds each way. Repeat __5-10__ times. Do _2-3___ sessions per day.    AROM: Forearm Pronation / Supination   With right arm in handshake position, slowly rotate palm down. Relax. Then rotate palm up. Repeat __5-10__ times per set. Do __2-3__ sessions per day.  Copyright  VHI. All rights reserved.    AROM: Finger Flexion / Extension   Actively bend fingers of right hand. Start with knuckles furthest from palm, and slowly make a fist. Hold __3__ seconds. Relax. Then straighten fingers as far as possible. Repeat _5-10___ times per set.  Do __2-3__ sessions per day. Also spread fingers as far as able, then bring them back together.  Copyright  VHI. All rights reserved.   If any pain/tingling symptoms occur, try all of these exercises first to promote circulation. If symptoms do not  ease off, then remove bandages and bring all wrappings back to next appointment.

## 2015-12-09 NOTE — Therapy (Signed)
[place], [place], [address] Phone: [phone]   Fax:  [phone]  Physical Therapy Treatment  Patient Details  Name: Donna May MRN: [id number] Date of Birth: [birth year] Referring Provider: Lurline Del, MD  Encounter Date: 12/09/2015      PT End of Session - 12/09/15 1221    Visit Number 2   Number of Visits 19   Date for PT Re-Evaluation 01/17/16   PT Start Time 0945   PT Stop Time 1030   PT Time Calculation (min) 45 min   Activity Tolerance Patient tolerated treatment well   Behavior During Therapy [facility] for tasks assessed/performed      Past Medical History:  Diagnosis Date  . Anemia   . Arthritis   . Breast cancer ([place]) 1992  . Diabetes mellitus    type 2  . Duodenitis 01/18/2002  . Fainting spell   . GERD (gastroesophageal reflux disease)   . Heart murmur   . Hiatal hernia 08/08/2008, 01/18/2002  . History of pneumonia   . Hyperlipidemia   . Hypertension   . UTI (lower urinary tract infection)     Past Surgical History:  Procedure Laterality Date  . AXILLARY SURGERY     cyst removal, right  . MASTECTOMY  1992   right, with flap  . NM MYOCAR PERF WALL MOTION  06/11/2009   Protocol:Bruce, post stress EF58%, EKG negative for ischemia, low risk  . TONSILLECTOMY    . TRANSTHORACIC ECHOCARDIOGRAM  12/24/2009   LVEF =>55%, normal study    There were no vitals filed for this visit.      Subjective Assessment - 12/09/15 0950    Subjective Pt is not happy about having to wear a sleeve all the time. She is wondering if she is having the swelling from what is wrong with her shoulder    Pertinent History Patient with a history of right mastectomy and axillary lymph node dissection in March of 1992, patient states she thinks about 5 lymph nodes were removed. She noticed swelling in her right UE at the beginning of August 2017.   Patient Stated Goals to get the arm back down   Currently in Pain? No/denies                         [facility] Adult PT Treatment/Exercise - 12/09/15 0001      Exercises   Exercises Neck     Neck Exercises: Seated   Other Seated Exercise ROM prior to manual lymph draiange.  also insructed with shoulder , elbow wrist and hand remedial exercise.      Manual Therapy   Manual Therapy Manual Lymphatic Drainage (MLD);Compression Bandaging   Manual therapy comments pt with tender tighness at right chest near previous incisions.    Manual Lymphatic Drainage (MLD) in supine, short neck, superficial and deep abdomnals, right inguinal nodes with right axillo-inguinal anastamosis, left axilary nodes with anterior interaxillary anastamosis, right shoulder upper arm, forearm and hand with return along pathways.    Compression Bandaging biotone applied to arm, thin stockinette, elastomull to finger 2,3,4, ariflex x 2 to right arm with thin foam at antecubital fossa, 6, 8 and 2 10 short stretch bandages.                 PT Education - 12/09/15 1219    Education provided Yes   Education Details remedical UE exercises for right arm.  bring all bandages  back if they are removed.    Person(s) Educated Patient   Methods Explanation   Comprehension Verbalized understanding           Short Term Clinic Goals - 12/04/15 1127      CC Short Term Goal  #1   Title Patient will be independent in self manual lymph drainage   Time 3   Period Weeks   Status New     CC Short Term Goal  #2   Title Decrease circumference measurement across hand at thumb webspace to 19.5 cm or less   Baseline 20.2 cm at eval   Time 3   Period Weeks   Status New     CC Short Term Goal  #3   Title Patient will be knowledgeable in lymphedema risk reduction practices.   Time 3   Period Weeks   Status New             Long Term Clinic Goals - 12/04/15 1129      CC Long Term Goal  #1   Title Patient will be knowledgeable about obtaining and  wearing compression garments   Time 6   Period Weeks   Status New     CC Long Term Goal  #2   Title Decrease circumference measurement at 10 cm proximal to olecranon to 34.5 cm or less   Baseline 36.9 at eval   Time 6   Period Weeks   Status New     CC Long Term Goal  #3   Title Patient will be able to don/doff compression garments independently   Time 6   Period Weeks   Status New     CC Long Term Goal  #4   Title Reduce Lymphedema Life Impact score to 15% or less.    Baseline 29% at eval   Time 6   Period Weeks   Status New            Plan - 12/09/15 1222    Clinical Impression Statement Pt has reservations about compression component of complete decongestive therapy but wanted to try bandaging today to see if it will make a difference.  She is concerned about getting and paying for compression sleeves.  She tolerated initial manual lymph driange well and had palpable firmness in forearm and upper arm that I feel has potential to respond to compression bandaging.  she will remove them if they fall off or are uncomfortable of if she is unable to work with them on and return all next session.    Rehab Potential Good   Clinical Impairments Affecting Rehab Potential history of right axillary lymph node dissection (in 1992)   PT Next Visit Plan assess tolerance of  compression bandaging of right UE; continue with MLD and bandaging if indicated, educated patient on lymphedema risk reduction when able   Consulted and Agree with Plan of Care Patient      Patient will benefit from skilled therapeutic intervention in order to improve the following deficits and impairments:  Impaired UE functional use, Increased edema, Pain  Visit Diagnosis: Postmastectomy lymphedema  Pain in right shoulder     Problem List Patient Active Problem List   Diagnosis Date Noted  . Ductal carcinoma in situ (DCIS) of right breast 11/26/2015  . Lymphedema of arm 11/26/2015  . Lymphedema  11/01/2015  . Leg pain, bilateral 12/28/2013  . Special screening for malignant neoplasms, colon 08/09/2013  . Renal artery stenosis, native, bilateral ([place]) 06/09/2013  .  Diabetes mellitus with renal complications ([place]) 33/43/5686  . RASH AND OTHER NONSPECIFIC SKIN ERUPTION 07/05/2008  . Malignant neoplasm of female breast ([place]) 07/04/2008  . HYPERCHOLESTEROLEMIA 07/04/2008  . Essential hypertension 07/04/2008  . GERD 07/04/2008  . DUODENITIS, WITHOUT HEMORRHAGE 07/04/2008  . HIATAL HERNIA 07/04/2008  . Arthropathy 07/04/2008   Donato Heinz. Owens Shark PT  Norwood Levo 12/09/2015, 12:25 PM  [place], [place], [address] Phone: [phone]   Fax:  [phone]  Name: Donna May MRN: [id number] Date of Birth: 1940-07-24

## 2015-12-12 ENCOUNTER — Ambulatory Visit: Payer: Medicare Other

## 2015-12-12 DIAGNOSIS — I972 Postmastectomy lymphedema syndrome: Secondary | ICD-10-CM | POA: Diagnosis not present

## 2015-12-12 DIAGNOSIS — M25511 Pain in right shoulder: Secondary | ICD-10-CM

## 2015-12-12 NOTE — Patient Instructions (Signed)
Cancer Rehab [phone] Start with circles near your neck above collarbones 5 times.  Deep Effective Breath   Standing, sitting, or laying down, place both hands on the belly. Take a deep breath IN, expanding the belly; then breath OUT, contracting the belly. Repeat __5__ times. Do __2-3__ sessions per day and before your self massage.  http://gt2.exer.us/866   Copyright  VHI. All rights reserved.  Axilla to Axilla - Sweep   On LEFT side make 5 circles in the armpit, then pump _5__ times from involved armpit across chest to uninvolved armpit, making a pathway. Do _1__ time per day.  Copyright  VHI. All rights reserved.  Axilla to Inguinal Nodes - Sweep   On RIGHT side, make 5 circles at groin at panty line, then pump _5__ times from armpit along side of trunk to outer hip, making your other pathway. Do __1_ time per day.  Copyright  VHI. All rights reserved.  Arm Posterior: Elbow to Shoulder - Sweep   Pump _5__ times from back of elbow to top of shoulder. Then inner to outer upper arm _5_ times, then outer arm again _5_ times. Then back to the pathways _2-3_ times. Do _1__ time per day.  Copyright  VHI. All rights reserved.  ARM: [place] Wrist to Elbow - Sweep   Pump or stationary circles _5__ times from wrist to elbow making sure to do both sides of the forearm. Then retrace your steps to the outer arm, and the pathways _2-3_ times each. Do _1__ time per day.  Copyright  VHI. All rights reserved.  ARM: Dorsum of Hand to Shoulder - Sweep   Pump or stationary circles _5__ times on back of hand including knuckle spaces and individual fingers if needed working up towards the wrist, then retrace all your steps working back up the forearm, doing both sides; upper outer arm and back to your pathways _2-3_ times each. Then do 5 circles again at LEFT armpit and RIGHT groin where you started! Good job!! Do __1_ time per day.  Copyright  VHI. All rights reserved.

## 2015-12-12 NOTE — Therapy (Signed)
[place], [place], [address] Phone: [phone]   Fax:  [phone]  Physical Therapy Treatment  Patient Details  Name: Donna May MRN: [id number] Date of Birth: 1940-09-05 Referring Provider: Lurline Del, MD  Encounter Date: 12/12/2015      PT End of Session - 12/12/15 1123    Visit Number 3   Number of Visits 19   Date for PT Re-Evaluation 01/17/16   PT Start Time 1020   PT Stop Time 1105   PT Time Calculation (min) 45 min   Activity Tolerance Patient tolerated treatment well   Behavior During Therapy [facility] for tasks assessed/performed      Past Medical History:  Diagnosis Date  . Anemia   . Arthritis   . Breast cancer ([place]) 1992  . Diabetes mellitus    type 2  . Duodenitis 01/18/2002  . Fainting spell   . GERD (gastroesophageal reflux disease)   . Heart murmur   . Hiatal hernia 08/08/2008, 01/18/2002  . History of pneumonia   . Hyperlipidemia   . Hypertension   . UTI (lower urinary tract infection)     Past Surgical History:  Procedure Laterality Date  . AXILLARY SURGERY     cyst removal, right  . MASTECTOMY  1992   right, with flap  . NM MYOCAR PERF WALL MOTION  06/11/2009   Protocol:Bruce, post stress EF58%, EKG negative for ischemia, low risk  . TONSILLECTOMY    . TRANSTHORACIC ECHOCARDIOGRAM  12/24/2009   LVEF =>55%, normal study    There were no vitals filed for this visit.      Subjective Assessment - 12/12/15 1134    Subjective Pt reports bandages slid off Tuesday after Mondays appt. My hand felt too tight like it was being squeezed together, and I can't have my fingers wrapped again. Been looking at a [employer] Ready Wrap to wear in between sessions when bandages come off.     Pertinent History Patient with a history of right mastectomy and axillary lymph node dissection in March of 1992, patient states she thinks about 5 lymph nodes were removed. She noticed  swelling in her right UE at the beginning of August 2017.   Patient Stated Goals to get the arm back down   Currently in Pain? No/denies               LYMPHEDEMA/ONCOLOGY QUESTIONNAIRE - 12/12/15 1030      Right Upper Extremity Lymphedema   15 cm Proximal to Olecranon Process 35.5 cm   10 cm Proximal to Olecranon Process 37.2 cm   Olecranon Process 32.9 cm   15 cm Proximal to Ulnar Styloid Process 29.5 cm   10 cm Proximal to Ulnar Styloid Process 25 cm   Just Proximal to Ulnar Styloid Process 19.3 cm   Across Hand at [employer] 20.4 cm   At [place] of 2nd Digit 6.9 cm                  [facility] Adult PT Treatment/Exercise - 12/12/15 0001      Manual Therapy   Manual Therapy Manual Lymphatic Drainage (MLD);Compression Bandaging   Manual Lymphatic Drainage (MLD) In supine, short neck, 5 diaphragmatic breaths and instructed pt in this, right inguinal nodes with right axillo-inguinal anastamosis, left axillary nodes with anterior interaxillary anastamosis, right shoulder upper arm, forearm and hand with return along pathways instructing pt throughout and having her practice at each postition performed by Ebony Hail  Nikki Dom, SPT directly observed by Collie Siad, PTA   Compression Bandaging Lotion applied to Rt arm, thin stockinette, Ariflex x1 to right arm with thin foam at antecubital fossa and 1/2" gray foam at dorsal hand to decrease feeling of adduction, 6, 8 and 2-10 cm short stretch bandages.                 PT Education - 12/12/15 1117    Education provided Yes   Education Details Self MLD and bandaging   Person(s) Educated Patient   Methods Explanation;Demonstration;Handout   Comprehension Verbalized understanding;Returned demonstration;Need further instruction           Short Term Clinic Goals - 12/12/15 1133      CC Short Term Goal  #1   Title Patient will be independent in self manual lymph drainage   Status Partially Met     CC Short  Term Goal  #2   Title Decrease circumference measurement across hand at thumb webspace to 19.5 cm or less   Baseline 20.2 cm at eval; 20.4 cm 12/12/15 (bandages off since 12/10/15)   Status On-going     CC Short Term Goal  #3   Title Patient will be knowledgeable in lymphedema risk reduction practices.   Status On-going             Long Term Clinic Goals - 12/04/15 1129      CC Long Term Goal  #1   Title Patient will be knowledgeable about obtaining and wearing compression garments   Time 6   Period Weeks   Status New     CC Long Term Goal  #2   Title Decrease circumference measurement at 10 cm proximal to olecranon to 34.5 cm or less   Baseline 36.9 at eval   Time 6   Period Weeks   Status New     CC Long Term Goal  #3   Title Patient will be able to don/doff compression garments independently   Time 6   Period Weeks   Status New     CC Long Term Goal  #4   Title Reduce Lymphedema Life Impact score to 15% or less.    Baseline 29% at eval   Time 6   Period Weeks   Status New            Plan - 12/12/15 1124    Clinical Impression Statement Pt reported her bandages had slid down Tuesday and reported her arm looked good at that time. Today her forearm and elbow were increased, all other measurements were essentially unchanged. Pt came in asking if she could order a [employer] as she had been researching these, in lieu of bandaging between sessions. Measured pt for this and she reports will order this soon so she can wear this when bandages come off as she can only come 2x/week due to work. She did well with instruction of manual lymph drainage today, issued handout for this and bandaging though pt will require further review.   Rehab Potential Good   Clinical Impairments Affecting Rehab Potential history of right axillary lymph node dissection (in 1992)   PT Frequency 3x / week   PT Duration 6 weeks   PT Treatment/Interventions ADLs/Self Care Home  Management;Therapeutic exercise;DME Instruction;Patient/family education;Manual techniques;Manual lymph drainage;Compression bandaging;Passive range of motion;Taping   PT Next Visit Plan assess tolerance of  compression bandaging of right UE; continue with MLD and bandaging if indicated, educated patient on lymphedema risk reduction  when able. Assess fit of [employer] Ready Wrap sleeve (suggested hand piece as well but pt did not want this) when pt brings it.    Consulted and Agree with Plan of Care Patient      Patient will benefit from skilled therapeutic intervention in order to improve the following deficits and impairments:  Impaired UE functional use, Increased edema, Pain  Visit Diagnosis: Postmastectomy lymphedema  Pain in right shoulder     Problem List Patient Active Problem List   Diagnosis Date Noted  . Ductal carcinoma in situ (DCIS) of right breast 11/26/2015  . Lymphedema of arm 11/26/2015  . Lymphedema 11/01/2015  . Leg pain, bilateral 12/28/2013  . Special screening for malignant neoplasms, colon 08/09/2013  . Renal artery stenosis, native, bilateral ([place]) 06/09/2013  . Diabetes mellitus with renal complications ([place]) 38/25/0539  . RASH AND OTHER NONSPECIFIC SKIN ERUPTION 07/05/2008  . Malignant neoplasm of female breast ([place]) 07/04/2008  . HYPERCHOLESTEROLEMIA 07/04/2008  . Essential hypertension 07/04/2008  . GERD 07/04/2008  . DUODENITIS, WITHOUT HEMORRHAGE 07/04/2008  . HIATAL HERNIA 07/04/2008  . Arthropathy 07/04/2008    Otelia Limes, PTA 12/12/2015, 11:37 AM  [place], [place], [address] Phone: [phone]   Fax:  [phone]  Name: Donna May MRN: [id number] Date of Birth: 05/30/1940

## 2015-12-13 DIAGNOSIS — H20013 Primary iridocyclitis, bilateral: Secondary | ICD-10-CM | POA: Diagnosis not present

## 2015-12-17 ENCOUNTER — Emergency Department (HOSPITAL_COMMUNITY)
Admission: EM | Admit: 2015-12-17 | Discharge: 2015-12-18 | Disposition: A | Discharge status: Home / Self Care | Payer: Medicare Other | Attending: Emergency Medicine | Admitting: Emergency Medicine

## 2015-12-17 ENCOUNTER — Ambulatory Visit (INDEPENDENT_AMBULATORY_CARE_PROVIDER_SITE_OTHER): Payer: Medicare Other

## 2015-12-17 ENCOUNTER — Ambulatory Visit (INDEPENDENT_AMBULATORY_CARE_PROVIDER_SITE_OTHER)
Admission: EM | Admit: 2015-12-17 | Discharge: 2015-12-17 | Disposition: A | Discharge status: Nursing Home (Includes ICF) | Payer: Medicare Other | Source: Home / Self Care | Attending: Family Medicine | Admitting: Family Medicine

## 2015-12-17 ENCOUNTER — Encounter (HOSPITAL_COMMUNITY): Payer: Self-pay | Admitting: Emergency Medicine

## 2015-12-17 ENCOUNTER — Ambulatory Visit: Payer: Medicare Other | Attending: Oncology

## 2015-12-17 DIAGNOSIS — Z87891 Personal history of nicotine dependence: Secondary | ICD-10-CM | POA: Diagnosis not present

## 2015-12-17 DIAGNOSIS — M25511 Pain in right shoulder: Secondary | ICD-10-CM

## 2015-12-17 DIAGNOSIS — I972 Postmastectomy lymphedema syndrome: Secondary | ICD-10-CM | POA: Insufficient documentation

## 2015-12-17 DIAGNOSIS — E1129 Type 2 diabetes mellitus with other diabetic kidney complication: Secondary | ICD-10-CM | POA: Diagnosis not present

## 2015-12-17 DIAGNOSIS — J069 Acute upper respiratory infection, unspecified: Secondary | ICD-10-CM

## 2015-12-17 DIAGNOSIS — R0602 Shortness of breath: Secondary | ICD-10-CM | POA: Diagnosis not present

## 2015-12-17 DIAGNOSIS — Z7984 Long term (current) use of oral hypoglycemic drugs: Secondary | ICD-10-CM | POA: Insufficient documentation

## 2015-12-17 DIAGNOSIS — Z7982 Long term (current) use of aspirin: Secondary | ICD-10-CM | POA: Insufficient documentation

## 2015-12-17 DIAGNOSIS — I10 Essential (primary) hypertension: Secondary | ICD-10-CM | POA: Diagnosis not present

## 2015-12-17 DIAGNOSIS — G8929 Other chronic pain: Secondary | ICD-10-CM | POA: Insufficient documentation

## 2015-12-17 DIAGNOSIS — R062 Wheezing: Secondary | ICD-10-CM | POA: Diagnosis not present

## 2015-12-17 DIAGNOSIS — R0600 Dyspnea, unspecified: Secondary | ICD-10-CM

## 2015-12-17 DIAGNOSIS — Z853 Personal history of malignant neoplasm of breast: Secondary | ICD-10-CM | POA: Insufficient documentation

## 2015-12-17 DIAGNOSIS — R05 Cough: Secondary | ICD-10-CM | POA: Diagnosis not present

## 2015-12-17 MED ORDER — ALBUTEROL SULFATE (2.5 MG/3ML) 0.083% IN NEBU
5.0000 mg | INHALATION_SOLUTION | Freq: Once | RESPIRATORY_TRACT | Status: AC
  Administered 2015-12-17: 5 mg via RESPIRATORY_TRACT

## 2015-12-17 MED ORDER — ALBUTEROL SULFATE (2.5 MG/3ML) 0.083% IN NEBU
5.0000 mg | INHALATION_SOLUTION | Freq: Once | RESPIRATORY_TRACT | Status: AC
  Administered 2015-12-17: 5 mg via RESPIRATORY_TRACT
  Filled 2015-12-17: qty 6

## 2015-12-17 MED ORDER — IPRATROPIUM BROMIDE 0.02 % IN SOLN
0.5000 mg | Freq: Once | RESPIRATORY_TRACT | Status: AC
  Administered 2015-12-17: 0.5 mg via RESPIRATORY_TRACT
  Filled 2015-12-17: qty 2.5

## 2015-12-17 MED ORDER — ACETAMINOPHEN 325 MG PO TABS
650.0000 mg | ORAL_TABLET | Freq: Once | ORAL | Status: AC | PRN
  Administered 2015-12-17: 650 mg via ORAL

## 2015-12-17 MED ORDER — ALBUTEROL SULFATE (2.5 MG/3ML) 0.083% IN NEBU
INHALATION_SOLUTION | RESPIRATORY_TRACT | Status: AC
  Filled 2015-12-17: qty 6

## 2015-12-17 MED ORDER — ACETAMINOPHEN 325 MG PO TABS
ORAL_TABLET | ORAL | Status: AC
  Filled 2015-12-17: qty 2

## 2015-12-17 NOTE — ED Triage Notes (Signed)
Pt states she feels better after breathing treatment.  

## 2015-12-17 NOTE — Therapy (Signed)
[place], [place], [address] Phone: [phone]   Fax:  [phone]  Physical Therapy Treatment  Patient Details  Name: Donna May MRN: [id number] Date of Birth: 1940/08/18 Referring Provider: Lurline Del, MD  Encounter Date: 12/17/2015      PT End of Session - 12/17/15 1214    Visit Number 4   Number of Visits 19   Date for PT Re-Evaluation 01/17/16   PT Start Time 1025   PT Stop Time 1104   PT Time Calculation (min) 39 min   Activity Tolerance Patient tolerated treatment well;Treatment limited secondary to medical complications (Comment)  Pt did not want to be bandaged due to chest cold she is currently experiencing   Behavior During Therapy [facility] for tasks assessed/performed      Past Medical History:  Diagnosis Date  . Anemia   . Arthritis   . Breast cancer ([place]) 1992  . Diabetes mellitus    type 2  . Duodenitis 01/18/2002  . Fainting spell   . GERD (gastroesophageal reflux disease)   . Heart murmur   . Hiatal hernia 08/08/2008, 01/18/2002  . History of pneumonia   . Hyperlipidemia   . Hypertension   . UTI (lower urinary tract infection)     Past Surgical History:  Procedure Laterality Date  . AXILLARY SURGERY     cyst removal, right  . MASTECTOMY  1992   right, with flap  . NM MYOCAR PERF WALL MOTION  06/11/2009   Protocol:Bruce, post stress EF58%, EKG negative for ischemia, low risk  . TONSILLECTOMY    . TRANSTHORACIC ECHOCARDIOGRAM  12/24/2009   LVEF =>55%, normal study    There were no vitals filed for this visit.      Subjective Assessment - 12/17/15 1028    Subjective My elbow started to pop out of the bandage the day after you wrapped me last time. But it stayed on until then. I have a bad cold so I don't want to be bandaged today because I've been wiping my nose alot. I ordered the velcro garment so it should arrive within the next week.    Pertinent History  Patient with a history of right mastectomy and axillary lymph node dissection in March of 1992, patient states she thinks about 5 lymph nodes were removed. She noticed swelling in her right UE at the beginning of August 2017.   Patient Stated Goals to get the arm back down   Currently in Pain? No/denies               LYMPHEDEMA/ONCOLOGY QUESTIONNAIRE - 12/17/15 1030      Right Upper Extremity Lymphedema   15 cm Proximal to Olecranon Process 35.4 cm   10 cm Proximal to Olecranon Process 36.3 cm   Olecranon Process 31.9 cm   15 cm Proximal to Ulnar Styloid Process 29.7 cm   10 cm Proximal to Ulnar Styloid Process 25.8 cm   Just Proximal to Ulnar Styloid Process 19.3 cm   Across Hand at [employer] 19.7 cm   At [place] of 2nd Digit 6.4 cm                  [facility] Adult PT Treatment/Exercise - 12/17/15 0001      Manual Therapy   Manual Therapy Manual Lymphatic Drainage (MLD)   Manual therapy comments Circumference measurements taken   Manual Lymphatic Drainage (MLD) In supine, short neck, 5 diaphragmatic breaths, right inguinal nodes  with right axillo-inguinal anastamosis, left axillary nodes with anterior interaxillary anastamosis, right shoulder upper arm, forearm and hand with return along pathways instructing pt throughout and having her practice at each postition   Compression Bandaging Pt did not want to be bandaged today                   Short Term Clinic Goals - 12/17/15 1225      CC Short Term Goal  #1   Title Patient will be independent in self manual lymph drainage   Status Partially Met     CC Short Term Goal  #2   Title Decrease circumference measurement across hand at thumb webspace to 19.5 cm or less   Baseline 20.2 cm at eval; 20.4 cm 12/12/15 (bandages off since 12/10/15); 19.7 cm 12/17/15   Status Partially Met     CC Short Term Goal  #3   Title Patient will be knowledgeable in lymphedema risk reduction practices.   Status On-going              Long Term Clinic Goals - 12/17/15 1227      CC Long Term Goal  #1   Title Patient will be knowledgeable about obtaining and wearing compression garments   Status Partially Met     CC Long Term Goal  #2   Title Decrease circumference measurement at 10 cm proximal to olecranon to 34.5 cm or less   Baseline 36.9 at eval; 36.3 12/17/15   Status On-going     CC Long Term Goal  #3   Title Patient will be able to don/doff compression garments independently   Status On-going     CC Long Term Goal  #4   Title Reduce Lymphedema Life Impact score to 15% or less.    Status On-going            Plan - 12/17/15 1216    Clinical Impression Statement Was unable to review self MLD with pt today as she wasn't feeling well. Also, she did not want to be bandaged today reporting that with the cold she started coming down with yesterday she is blowing her nose alot and coughing and just didn't want to deal with having her arm wrapped on top of feeling bad. She did order the [employer] yesterday that we measured her for last week. So pt plans to wear that in lieu of bandaging as she reports the bandaging just isn't working for her. Her circumference measuremnts overall weren't increased much from not being wrapped since last week except at her forearm where she has the most swelling.  Pt will continue to benefit from continued therpay to address her swelling and get her independent in the management of these symtpoms.    Rehab Potential Good   Clinical Impairments Affecting Rehab Potential history of right axillary lymph node dissection (in 1992)   PT Frequency 3x / week   PT Duration 6 weeks   PT Treatment/Interventions ADLs/Self Care Home Management;Therapeutic exercise;DME Instruction;Patient/family education;Manual techniques;Manual lymph drainage;Compression bandaging;Passive range of motion;Taping   PT Next Visit Plan Continue with MLD (and review with pt) and bandaging if  indicated, once she brings her [employer] Ready Wrap assess fit of this and make sure pt able to don/dof properly. Might need to cont with hand wrapping and further instruct pt in this. Educate patient on lymphedema risk reduction when able.   Consulted and Agree with Plan of Care Patient      Patient  will benefit from skilled therapeutic intervention in order to improve the following deficits and impairments:  Impaired UE functional use, Increased edema, Pain  Visit Diagnosis: Postmastectomy lymphedema  Right shoulder pain, unspecified chronicity     Problem List Patient Active Problem List   Diagnosis Date Noted  . Ductal carcinoma in situ (DCIS) of right breast 11/26/2015  . Lymphedema of arm 11/26/2015  . Lymphedema 11/01/2015  . Leg pain, bilateral 12/28/2013  . Special screening for malignant neoplasms, colon 08/09/2013  . Renal artery stenosis, native, bilateral ([place]) 06/09/2013  . Diabetes mellitus with renal complications ([place]) 92/34/1443  . RASH AND OTHER NONSPECIFIC SKIN ERUPTION 07/05/2008  . Malignant neoplasm of female breast ([place]) 07/04/2008  . HYPERCHOLESTEROLEMIA 07/04/2008  . Essential hypertension 07/04/2008  . GERD 07/04/2008  . DUODENITIS, WITHOUT HEMORRHAGE 07/04/2008  . HIATAL HERNIA 07/04/2008  . Arthropathy 07/04/2008    Otelia Limes, PTA 12/17/2015, 12:29 PM  [place], [place], [address] Phone: [phone]   Fax:  [phone]  Name: Donna May MRN: [id number] Date of Birth: 1941-01-16

## 2015-12-17 NOTE — ED Triage Notes (Signed)
Pt. Stated, I've had this cough and it started by sneezing.  I've also been SOB.

## 2015-12-17 NOTE — ED Provider Notes (Addendum)
[place]    CSN: [id number] Arrival date & time: 12/17/15  1743     History   Chief Complaint Chief Complaint  Patient presents with  . Cough  . Shortness of Breath    HPI Donna May is a 75 y.o. female.   The history is provided by the patient.  Shortness of Breath  Severity:  Moderate Onset quality:  Sudden Duration:  4 days Progression:  Worsening Chronicity:  New Context: URI and weather changes   Relieved by:  None tried Worsened by:  Nothing Ineffective treatments:  None tried Associated symptoms: cough, sputum production and wheezing   Associated symptoms: no fever     Past Medical History:  Diagnosis Date  . Anemia   . Arthritis   . Breast cancer ([place]) 1992  . Diabetes mellitus    type 2  . Duodenitis 01/18/2002  . Fainting spell   . GERD (gastroesophageal reflux disease)   . Heart murmur   . Hiatal hernia 08/08/2008, 01/18/2002  . History of pneumonia   . Hyperlipidemia   . Hypertension   . UTI (lower urinary tract infection)     Patient Active Problem List   Diagnosis Date Noted  . Ductal carcinoma in situ (DCIS) of right breast 11/26/2015  . Lymphedema of arm 11/26/2015  . Lymphedema 11/01/2015  . Leg pain, bilateral 12/28/2013  . Special screening for malignant neoplasms, colon 08/09/2013  . Renal artery stenosis, native, bilateral ([place]) 06/09/2013  . Diabetes mellitus with renal complications ([place]) 77/93/9030  . RASH AND OTHER NONSPECIFIC SKIN ERUPTION 07/05/2008  . Malignant neoplasm of female breast ([place]) 07/04/2008  . HYPERCHOLESTEROLEMIA 07/04/2008  . Essential hypertension 07/04/2008  . GERD 07/04/2008  . DUODENITIS, WITHOUT HEMORRHAGE 07/04/2008  . HIATAL HERNIA 07/04/2008  . Arthropathy 07/04/2008    Past Surgical History:  Procedure Laterality Date  . AXILLARY SURGERY     cyst removal, right  . MASTECTOMY  1992   right, with flap  . NM MYOCAR PERF WALL MOTION  06/11/2009   Protocol:Bruce, post stress  EF58%, EKG negative for ischemia, low risk  . TONSILLECTOMY    . TRANSTHORACIC ECHOCARDIOGRAM  12/24/2009   LVEF =>55%, normal study    OB History    No data available       Home Medications    Prior to Admission medications   Medication Sig Start Date End Date Taking? Authorizing Provider  AMBULATORY NON FORMULARY MEDICATION Place 0.0125 g rectally 2 (two) times daily. Medication Name: Nitroglycerin ointment .0923. Apply 2-3 times daily as needed. Patient not taking: Reported on 12/04/2015 04/23/15   Manus Gunning, MD  amLODipine (NORVASC) 10 MG tablet TAKE 1 TABLET (10 MG TOTAL) BY MOUTH DAILY. NEED OFFICE VISIT 07/05/15   Sanda Klein, MD  aspirin 81 MG tablet Take 81 mg by mouth daily.    Historical Provider, MD  CALCIUM-MAGNESIUM-VITAMIN D PO Take 1 tablet by mouth daily.    Historical Provider, MD  Cholecalciferol (VITAMIN D3) 2000 UNITS TABS Take 1 tablet by mouth daily.    Historical Provider, MD  CRESTOR 20 MG tablet Take 1 tablet (20 mg total) by mouth daily. 05/30/15   Mihai [name], MD  Echinacea 400 MG CAPS Take 1 capsule by mouth daily.    Historical Provider, MD  EVISTA 60 MG tablet Take 60 mg by mouth daily.  05/20/11   Historical Provider, MD  JANUVIA 100 MG tablet Take 100 mg by mouth daily.  05/20/11   Historical  Provider, MD  lansoprazole (PREVACID) 30 MG capsule TAKE 1 CAPSULE DAILY BEFOREBREAKFAST 07/04/15   Manus Gunning, MD  metFORMIN (GLUCOPHAGE-XR) 500 MG 24 hr tablet Take 500 mg by mouth 2 (two) times daily before a meal.  06/01/11   Historical Provider, MD  metoprolol succinate (TOPROL-XL) 100 MG 24 hr tablet Take 1 tablet (100 mg total) by mouth daily. Take with or immediately following a meal. 10/11/15   Mihai [name], MD  Multiple Vitamins-Minerals (CENTRUM SILVER ULTRA WOMENS) TABS Take 1 tablet by mouth daily.    Historical Provider, MD  Omega 3 1000 MG CAPS Take 1 capsule by mouth daily.    Historical Provider, MD    valsartan-hydrochlorothiazide (DIOVAN-HCT) 320-12.5 MG tablet TAKE 1 TABLET BY MOUTH DAILY. NEED OV. 10/22/15   Minus Breeding, MD    Family History Family History  Problem Relation Age of Onset  . Breast cancer Cousin   . Diabetes Brother   . Heart disease Mother   . Hypertension Mother   . Heart disease Father   . Hypertension Father   . Prostate cancer Brother   . Heart attack Maternal Grandmother   . Stroke Maternal Grandfather     Social History Social History  Substance Use Topics  . Smoking status: Former Research scientist (life sciences)  . Smokeless tobacco: Never Used  . Alcohol use 0.0 oz/week     Comment: occ     Allergies   Review of patient's allergies indicates no known allergies.   Review of Systems Review of Systems  Constitutional: Negative for fever.  Respiratory: Positive for cough, sputum production, shortness of breath and wheezing.   Cardiovascular: Negative.   All other systems reviewed and are negative.    Physical Exam Triage Vital Signs ED Triage Vitals  Enc Vitals Group     BP      Pulse      Resp      Temp      Temp src      SpO2      Weight      Height      Head Circumference      Peak Flow      Pain Score      Pain Loc      Pain Edu?      Excl. in [place]?    No data found.   Updated Vital Signs BP (!) 203/105 (BP Location: Left Arm)   Pulse 96   Temp 98.5 F (36.9 C) (Oral)   Resp 19   SpO2 97%   Visual Acuity Right Eye Distance:   Left Eye Distance:   Bilateral Distance:    Right Eye Near:   Left Eye Near:    Bilateral Near:     Physical Exam  Constitutional: She is oriented to person, place, and time. She appears well-developed and well-nourished. She appears distressed.  HENT:  Mouth/Throat: Oropharynx is clear and moist.  Neck: Normal range of motion. Neck supple.  Cardiovascular: Normal rate.   Pulmonary/Chest: Effort normal. She has decreased breath sounds. She has wheezes. She has rhonchi.  Lymphadenopathy:    She has no  cervical adenopathy.  Neurological: She is alert and oriented to person, place, and time.  Skin: Skin is warm and dry.  Nursing note and vitals reviewed.    UC Treatments / Results  Labs (all labs ordered are listed, but only abnormal results are displayed) Labs Reviewed - No data to display  EKG  EKG Interpretation None  Radiology Dg Chest 2 View  Result Date: 12/17/2015 CLINICAL DATA:  75 y/o F; cough with shortness of breath and wheezing. EXAM: CHEST  2 VIEW COMPARISON:  01/26/2009 chest radiograph. FINDINGS: The heart size and mediastinal contours are within normal limits and stable. Both lungs are clear. Mild S-shaped curvature of the spine. Surgical clips project over right axilla and breast. IMPRESSION: No active cardiopulmonary disease. Electronically Signed   By: Kristine Garbe M.D.   On: 12/17/2015 19:35   X-rays reviewed and report per radiologist.  Procedures Procedures (including critical care time)  Medications Ordered in UC Medications - No data to display   Initial Impression / Assessment and Plan / UC Course  I have reviewed the triage vital signs and the nursing notes.  Pertinent labs & imaging results that were available during my care of the patient were reviewed by me and considered in my medical decision making (see chart for details).  Clinical Course   Sent for further eval and care of dyspnea and sob with nl cxr.   Final Clinical Impressions(s) / UC Diagnoses   Final diagnoses:  Dyspnea, unspecified type    New Prescriptions New Prescriptions   No medications on file     Billy Fischer, MD 12/17/15 [place], MD 12/17/15 [phone]

## 2015-12-17 NOTE — ED Notes (Signed)
Pt c/o cough and sneezing and headache x2 days. Pt also Hypertensive, hx of same and is taking medicines.

## 2015-12-17 NOTE — ED Notes (Signed)
Called report to [place], Therapist, sports

## 2015-12-17 NOTE — ED Notes (Signed)
Attempted IV x2 without success  

## 2015-12-17 NOTE — ED Notes (Signed)
Pt. Transferred to ED via shuttle, for further evaluation.

## 2015-12-18 LAB — CBC WITH DIFFERENTIAL/PLATELET
BASOS ABS: 0 10*3/uL (ref 0.0–0.1)
Basophils Relative: 0 %
EOS PCT: 2 %
Eosinophils Absolute: 0.2 10*3/uL (ref 0.0–0.7)
HCT: 38.9 % (ref 36.0–46.0)
Hemoglobin: 12.3 g/dL (ref 12.0–15.0)
LYMPHS PCT: 18 %
Lymphs Abs: 1.6 10*3/uL (ref 0.7–4.0)
MCH: 28.4 pg (ref 26.0–34.0)
MCHC: 31.6 g/dL (ref 30.0–36.0)
MCV: 89.8 fL (ref 78.0–100.0)
MONO ABS: 0.5 10*3/uL (ref 0.1–1.0)
Monocytes Relative: 6 %
Neutro Abs: 6.7 10*3/uL (ref 1.7–7.7)
Neutrophils Relative %: 74 %
PLATELETS: 234 10*3/uL (ref 150–400)
RBC: 4.33 MIL/uL (ref 3.87–5.11)
RDW: 15.4 % (ref 11.5–15.5)
WBC: 9 10*3/uL (ref 4.0–10.5)

## 2015-12-18 LAB — COMPREHENSIVE METABOLIC PANEL
ALT: 15 U/L (ref 14–54)
AST: 23 U/L (ref 15–41)
Albumin: 3.5 g/dL (ref 3.5–5.0)
Alkaline Phosphatase: 50 U/L (ref 38–126)
Anion gap: 9 (ref 5–15)
BUN: 14 mg/dL (ref 6–20)
CHLORIDE: 104 mmol/L (ref 101–111)
CO2: 25 mmol/L (ref 22–32)
Calcium: 9.1 mg/dL (ref 8.9–10.3)
Creatinine, Ser: 1.13 mg/dL — ABNORMAL HIGH (ref 0.44–1.00)
GFR, EST AFRICAN AMERICAN: 53 mL/min — AB (ref >60)
GFR, EST NON AFRICAN AMERICAN: 46 mL/min — AB (ref >60)
Glucose, Bld: 126 mg/dL — ABNORMAL HIGH (ref 65–99)
POTASSIUM: 3.7 mmol/L (ref 3.5–5.1)
Sodium: 138 mmol/L (ref 135–145)
Total Bilirubin: 0.6 mg/dL (ref 0.3–1.2)
Total Protein: 6.5 g/dL (ref 6.5–8.1)

## 2015-12-18 MED ORDER — PREDNISONE 20 MG PO TABS: 60.0000 mg | ORAL_TABLET | Freq: Every day | ORAL | 0 refills | Status: DC

## 2015-12-18 MED ORDER — ALBUTEROL SULFATE HFA 108 (90 BASE) MCG/ACT IN AERS: 1.0000 | INHALATION_SPRAY | Freq: Four times a day (QID) | RESPIRATORY_TRACT | 0 refills | Status: DC | PRN

## 2015-12-18 NOTE — ED Notes (Signed)
Discharge instructions and prescriptions reviewed - voiced understanding 

## 2015-12-18 NOTE — ED Provider Notes (Deleted)
75 year old female comes in with a 2 day history of cough productive of yellowish sputum. There's been no fever or chills. She was seen at urgent care center and referred here. She was noted to have some wheezing and has been given nebulizer treatments with complete resolution of wheezing. On exam now, lungs are completely clear and she feels much better. She'll be discharged with prescription for albuterol inhaler and steroids.  Medical screening examination/treatment/procedure(s) were conducted as a shared visit with non-physician practitioner(s) and myself.  I personally evaluated the patient during the encounter.   EKG Interpretation  Date/Time:  Tuesday December 17 2015 20:44:08 EDT Ventricular Rate:  95 PR Interval:  142 QRS Duration: 74 QT Interval:  352 QTC Calculation: 442 R Axis:   63 Text Interpretation:  Normal sinus rhythm Left ventricular hypertrophy Repolarization abnormality secondary to ventricular hypertrophy Abnormal ECG No old tracing to compare Confirmed by [facility]  MD, Tevita Gomer ([id number]) on 12/18/2015 3:16:41 AM         Delora Fuel, MD 75/43/60 6770

## 2015-12-18 NOTE — ED Provider Notes (Signed)
[facility] DEPT Provider Note   CSN: [id number] Arrival date & time: 12/17/15  2020     History   Chief Complaint Chief Complaint  Patient presents with  . Cough  . Headache    HPI Donna May is a 75 y.o. female.  Patient with a 2-day history of productive cough, wheezing and congestion. No chest pain, vomiting or headache. She is eating and drinking as per her usual. She reports she feels fatigued as well. She went to Urgent Care prior to arrival and was sent here for evaluation of potential pneumonia.    The history is provided by the patient. No language interpreter was used.    Past Medical History:  Diagnosis Date  . Anemia   . Arthritis   . Breast cancer ([place]) 1992  . Diabetes mellitus    type 2  . Duodenitis 01/18/2002  . Fainting spell   . GERD (gastroesophageal reflux disease)   . Heart murmur   . Hiatal hernia 08/08/2008, 01/18/2002  . History of pneumonia   . Hyperlipidemia   . Hypertension   . UTI (lower urinary tract infection)     Patient Active Problem List   Diagnosis Date Noted  . Ductal carcinoma in situ (DCIS) of right breast 11/26/2015  . Lymphedema of arm 11/26/2015  . Lymphedema 11/01/2015  . Leg pain, bilateral 12/28/2013  . Special screening for malignant neoplasms, colon 08/09/2013  . Renal artery stenosis, native, bilateral ([place]) 06/09/2013  . Diabetes mellitus with renal complications ([place]) 39/76/7341  . RASH AND OTHER NONSPECIFIC SKIN ERUPTION 07/05/2008  . Malignant neoplasm of female breast ([place]) 07/04/2008  . HYPERCHOLESTEROLEMIA 07/04/2008  . Essential hypertension 07/04/2008  . GERD 07/04/2008  . DUODENITIS, WITHOUT HEMORRHAGE 07/04/2008  . HIATAL HERNIA 07/04/2008  . Arthropathy 07/04/2008    Past Surgical History:  Procedure Laterality Date  . AXILLARY SURGERY     cyst removal, right  . MASTECTOMY  1992   right, with flap  . NM MYOCAR PERF WALL MOTION  06/11/2009   Protocol:Bruce, post stress EF58%, EKG  negative for ischemia, low risk  . TONSILLECTOMY    . TRANSTHORACIC ECHOCARDIOGRAM  12/24/2009   LVEF =>55%, normal study    OB History    No data available       Home Medications    Prior to Admission medications   Medication Sig Start Date End Date Taking? Authorizing Provider  AMBULATORY NON FORMULARY MEDICATION Place 0.0125 g rectally 2 (two) times daily. Medication Name: Nitroglycerin ointment .9379. Apply 2-3 times daily as needed. Patient taking differently: Place 0.0125 g rectally 2 (two) times daily as needed (pain in rectum). Medication Name: Nitroglycerin ointment .0240. Apply 2-3 times daily as needed. 04/23/15  Yes Manus Gunning, MD  amLODipine (NORVASC) 10 MG tablet TAKE 1 TABLET (10 MG TOTAL) BY MOUTH DAILY. NEED OFFICE VISIT 07/05/15  Yes Mihai [name], MD  aspirin 81 MG tablet Take 81 mg by mouth daily.   Yes Historical Provider, MD  CALCIUM-MAGNESIUM-VITAMIN D PO Take 1 tablet by mouth daily.   Yes Historical Provider, MD  Cholecalciferol (VITAMIN D3) 2000 UNITS TABS Take 1 tablet by mouth daily.   Yes Historical Provider, MD  CRESTOR 20 MG tablet Take 1 tablet (20 mg total) by mouth daily. 05/30/15  Yes Mihai [name], MD  Echinacea 400 MG CAPS Take 1 capsule by mouth daily.   Yes Historical Provider, MD  EVISTA 60 MG tablet Take 60 mg by mouth daily.  05/20/11  Yes  Historical Provider, MD  JANUVIA 100 MG tablet Take 100 mg by mouth daily.  05/20/11  Yes Historical Provider, MD  lansoprazole (PREVACID) 30 MG capsule TAKE 1 CAPSULE DAILY BEFOREBREAKFAST 07/04/15  Yes Manus Gunning, MD  metFORMIN (GLUCOPHAGE-XR) 500 MG 24 hr tablet Take 500 mg by mouth 2 (two) times daily before a meal.  06/01/11  Yes Historical Provider, MD  metoprolol succinate (TOPROL-XL) 100 MG 24 hr tablet Take 1 tablet (100 mg total) by mouth daily. Take with or immediately following a meal. 10/11/15  Yes Mihai [name], MD  Multiple Vitamins-Minerals (CENTRUM SILVER ULTRA WOMENS) TABS Take  1 tablet by mouth daily.   Yes Historical Provider, MD  Omega 3 1000 MG CAPS Take 1 capsule by mouth daily.   Yes Historical Provider, MD  valsartan-hydrochlorothiazide (DIOVAN-HCT) 320-12.5 MG tablet TAKE 1 TABLET BY MOUTH DAILY. NEED OV. 10/22/15  Yes Minus Breeding, MD    Family History Family History  Problem Relation Age of Onset  . Breast cancer Cousin   . Diabetes Brother   . Heart disease Mother   . Hypertension Mother   . Heart disease Father   . Hypertension Father   . Prostate cancer Brother   . Heart attack Maternal Grandmother   . Stroke Maternal Grandfather     Social History Social History  Substance Use Topics  . Smoking status: Former Research scientist (life sciences)  . Smokeless tobacco: Never Used  . Alcohol use 0.0 oz/week     Comment: occ     Allergies   Review of patient's allergies indicates no known allergies.   Review of Systems Review of Systems  Constitutional: Positive for fatigue. Negative for chills and fever.  HENT: Positive for congestion. Negative for sore throat and trouble swallowing.   Respiratory: Positive for cough and wheezing.   Cardiovascular: Negative.  Negative for chest pain.  Gastrointestinal: Negative.  Negative for abdominal pain, nausea and vomiting.  Genitourinary: Negative for decreased urine volume.  Musculoskeletal: Negative.  Negative for myalgias and neck stiffness.  Skin: Negative.  Negative for rash.  Neurological: Negative.      Physical Exam Updated Vital Signs BP 144/65   Pulse 66   Temp 99.1 F (37.3 C) (Oral)   Resp 18   SpO2 96%   Physical Exam  Constitutional: She appears well-developed and well-nourished.  HENT:  Head: Normocephalic.  Mouth/Throat: Oropharynx is clear and moist.  Neck: Normal range of motion. Neck supple.  Cardiovascular: Normal rate and regular rhythm.   Pulmonary/Chest: Effort normal. No respiratory distress. She has wheezes. She has no rales. She exhibits no tenderness.  Abdominal: Soft. Bowel  sounds are normal. There is no tenderness. There is no rebound and no guarding.  Musculoskeletal: Normal range of motion.  Neurological: She is alert. No cranial nerve deficit.  Skin: Skin is warm and dry. No rash noted.  Psychiatric: She has a normal mood and affect.     ED Treatments / Results  Labs (all labs ordered are listed, but only abnormal results are displayed) Labs Reviewed  COMPREHENSIVE METABOLIC PANEL - Abnormal; Notable for the following:       Result Value   Glucose, Bld 126 (*)    Creatinine, Ser 1.13 (*)    GFR calc non Af Amer 46 (*)    GFR calc Af Amer 53 (*)    All other components within normal limits  CBC WITH DIFFERENTIAL/PLATELET  LACTIC ACID, PLASMA    EKG  EKG Interpretation  Date/Time:  Tuesday December 17 2015 20:44:08 EDT Ventricular Rate:  95 PR Interval:  142 QRS Duration: 74 QT Interval:  352 QTC Calculation: 442 R Axis:   63 Text Interpretation:  Normal sinus rhythm Left ventricular hypertrophy Repolarization abnormality secondary to ventricular hypertrophy Abnormal ECG No old tracing to compare Confirmed by [facility]  MD, DAVID ([id number]) on 12/18/2015 3:16:41 AM       Radiology Dg Chest 2 View  Result Date: 12/17/2015 CLINICAL DATA:  75 y/o F; cough with shortness of breath and wheezing. EXAM: CHEST  2 VIEW COMPARISON:  01/26/2009 chest radiograph. FINDINGS: The heart size and mediastinal contours are within normal limits and stable. Both lungs are clear. Mild S-shaped curvature of the spine. Surgical clips project over right axilla and breast. IMPRESSION: No active cardiopulmonary disease. Electronically Signed   By: Kristine Garbe M.D.   On: 12/17/2015 19:35    Procedures Procedures (including critical care time)  Medications Ordered in ED Medications  albuterol (PROVENTIL) (2.5 MG/3ML) 0.083% nebulizer solution (not administered)  acetaminophen (TYLENOL) 325 MG tablet (not administered)  albuterol (PROVENTIL) (2.5 MG/3ML)  0.083% nebulizer solution 5 mg (5 mg Nebulization Given 12/17/15 2033)  acetaminophen (TYLENOL) tablet 650 mg (650 mg Oral Given 12/17/15 2110)  albuterol (PROVENTIL) (2.5 MG/3ML) 0.083% nebulizer solution 5 mg (5 mg Nebulization Given 12/17/15 2330)  ipratropium (ATROVENT) nebulizer solution 0.5 mg (0.5 mg Nebulization Given 12/17/15 2329)     Initial Impression / Assessment and Plan / ED Course  I have reviewed the triage vital signs and the nursing notes.  Pertinent labs & imaging results that were available during my care of the patient were reviewed by me and considered in my medical decision making (see chart for details).  Clinical Course    Patient provided 2 nebulizer treatments in the ED with complete resolution of wheezing. She is moving air well, and coughing has improved.   No hypoxia. CXR does not show pneumonia. Patient is well appearing, non-toxic. Likely viral process requiring supportive care.   She is examined by Dr. Roxanne Mins and is found to be stable for discharge home.   Final Clinical Impressions(s) / ED Diagnoses   Final diagnoses:  None  1. URI 2. Wheezing   New Prescriptions New Prescriptions   No medications on file     Charlann Lange, Hershal Coria 83/09/40 [id number]    David Glick, MD 88/11/03 1594

## 2015-12-19 ENCOUNTER — Ambulatory Visit: Payer: Medicare Other

## 2015-12-23 ENCOUNTER — Ambulatory Visit: Payer: Medicare Other

## 2015-12-23 DIAGNOSIS — I972 Postmastectomy lymphedema syndrome: Secondary | ICD-10-CM

## 2015-12-23 DIAGNOSIS — G8929 Other chronic pain: Secondary | ICD-10-CM

## 2015-12-23 DIAGNOSIS — M25511 Pain in right shoulder: Secondary | ICD-10-CM | POA: Diagnosis not present

## 2015-12-23 NOTE — Therapy (Signed)
[place], [place], [address] Phone: [phone]   Fax:  [phone]  Physical Therapy Treatment  Patient Details  Name: Donna May MRN: [id number] Date of Birth: Dec 23, 1940 Referring Provider: Lurline Del, MD  Encounter Date: 12/23/2015      PT End of Session - 12/23/15 1117    Visit Number 5   Number of Visits 19   Date for PT Re-Evaluation 01/17/16   PT Start Time 1024   PT Stop Time 1108   PT Time Calculation (min) 44 min   Activity Tolerance Patient tolerated treatment well   Behavior During Therapy [facility] for tasks assessed/performed      Past Medical History:  Diagnosis Date  . Anemia   . Arthritis   . Breast cancer ([place]) 1992  . Diabetes mellitus    type 2  . Duodenitis 01/18/2002  . Fainting spell   . GERD (gastroesophageal reflux disease)   . Heart murmur   . Hiatal hernia 08/08/2008, 01/18/2002  . History of pneumonia   . Hyperlipidemia   . Hypertension   . UTI (lower urinary tract infection)     Past Surgical History:  Procedure Laterality Date  . AXILLARY SURGERY     cyst removal, right  . MASTECTOMY  1992   right, with flap  . NM MYOCAR PERF WALL MOTION  06/11/2009   Protocol:Bruce, post stress EF58%, EKG negative for ischemia, low risk  . TONSILLECTOMY    . TRANSTHORACIC ECHOCARDIOGRAM  12/24/2009   LVEF =>55%, normal study    There were no vitals filed for this visit.      Subjective Assessment - 12/23/15 1027    Subjective I went to Urgent Care after our last session and they sent me to the ED to rule out pneumonia which they ruled out but I was put on prednisone which I've finished, and an inhaler which I am still taking. My ReadyWrap arrived and I want to use that today instead of bandaging.    Pertinent History Patient with a history of right mastectomy and axillary lymph node dissection in March of 1992, patient states she thinks about 5 lymph nodes were  removed. She noticed swelling in her right UE at the beginning of August 2017.   Patient Stated Goals to get the arm back down   Currently in Pain? No/denies               LYMPHEDEMA/ONCOLOGY QUESTIONNAIRE - 12/23/15 1054      Right Upper Extremity Lymphedema   15 cm Proximal to Olecranon Process 34.4 cm   10 cm Proximal to Olecranon Process 35.5 cm   Olecranon Process 31.4 cm   15 cm Proximal to Ulnar Styloid Process 29 cm   10 cm Proximal to Ulnar Styloid Process 25.8 cm   Just Proximal to Ulnar Styloid Process 19.2 cm   Across Hand at [employer] 19.4 cm   At [place] of 2nd Digit 6.6 cm                  [facility] Adult PT Treatment/Exercise - 12/23/15 0001      Manual Therapy   Manual Therapy Manual Lymphatic Drainage (MLD);Other (comment)   Manual therapy comments Circumference measurements taken; then after manual lymph drainage applied pts Rt UE Solaris Ready Wrap garment to her arm and the gauntlet she had also ordered for her hand which she reported was a good fit. Then had pt return demo so  therapist could assess her technique.    Manual Lymphatic Drainage (MLD) In supine, short neck, superficial and 5 diaphragmatic breaths, right inguinal nodes with right axillo-inguinal anastamosis, left axillary nodes with anterior interaxillary anastamosis, right shoulder upper arm, forearm and hand with return along pathways instructing pt throughout and having her practice at each postition   Compression Bandaging --                PT Education - 12/23/15 1116    Education provided Yes   Education Details Proper donning and tightening of straps of pts new So[employer]ris ReadyWrap UE garment. Also instructed pt to keep an eye on her skin as the garment was leaving a line in her arm from the seam after only wearing a few minutes.    Person(s) Educated Patient   Methods Explanation;Demonstration;Tactile cues;Verbal cues   Comprehension Verbalized understanding;Returned  demonstration           Short Term Clinic Goals - 12/17/15 1225      CC Short Term Goal  #1   Title Patient will be independent in self manual lymph drainage   Status Partially Met     CC Short Term Goal  #2   Title Decrease circumference measurement across hand at thumb webspace to 19.5 cm or less   Baseline 20.2 cm at eval; 20.4 cm 12/12/15 (bandages off since 12/10/15); 19.7 cm 12/17/15   Status Partially Met     CC Short Term Goal  #3   Title Patient will be knowledgeable in lymphedema risk reduction practices.   Status On-going             Long Term Clinic Goals - 12/17/15 1227      CC Long Term Goal  #1   Title Patient will be knowledgeable about obtaining and wearing compression garments   Status Partially Met     CC Long Term Goal  #2   Title Decrease circumference measurement at 10 cm proximal to olecranon to 34.5 cm or less   Baseline 36.9 at eval; 36.3 12/17/15   Status On-going     CC Long Term Goal  #3   Title Patient will be able to don/doff compression garments independently   Status On-going     CC Long Term Goal  #4   Title Reduce Lymphedema Life Impact score to 15% or less.    Status On-going            Plan - 12/23/15 1118    Clinical Impression Statement Pt brought her new Solaris Ready Wrap garment for her Rt UE and separate gaunlet that she ordered at same time (therapist didn't measure pt for this, she decided to order it at same time as garment though but it turned out to be a good fit). Pts circumerence measurements were taken today and they reduced from last week. Pt returned good demonstration of proper donning of compression garment at end of session today and reports she thinks she will wear this more consistently than the bandages.    Rehab Potential Good   Clinical Impairments Affecting Rehab Potential history of right axillary lymph node dissection (in 1992)   PT Frequency 3x / week   PT Duration 6 weeks   PT  Treatment/Interventions ADLs/Self Care Home Management;Therapeutic exercise;DME Instruction;Patient/family education;Manual techniques;Manual lymph drainage;Compression bandaging;Passive range of motion;Taping   PT Next Visit Plan Continue with MLD and remeasure circumference to assess if Solaris garment is helping. Also assess if gauntelt is keeping her hand  swelling controlled, may need to instruct pt in bandaging or ave her order the Solaris hand piece if gauntlet doesn't keep fluid contained. Educate pt on lymphedema risk reduction/infection prevention practices.    Consulted and Agree with Plan of Care Patient      Patient will benefit from skilled therapeutic intervention in order to improve the following deficits and impairments:  Impaired UE functional use, Increased edema, Pain  Visit Diagnosis: Postmastectomy lymphedema  Right shoulder pain, unspecified chronicity  Chronic right shoulder pain     Problem List Patient Active Problem List   Diagnosis Date Noted  . Ductal carcinoma in situ (DCIS) of right breast 11/26/2015  . Lymphedema of arm 11/26/2015  . Lymphedema 11/01/2015  . Leg pain, bilateral 12/28/2013  . Special screening for malignant neoplasms, colon 08/09/2013  . Renal artery stenosis, native, bilateral ([place]) 06/09/2013  . Diabetes mellitus with renal complications ([place]) 25/67/2091  . RASH AND OTHER NONSPECIFIC SKIN ERUPTION 07/05/2008  . Malignant neoplasm of female breast ([place]) 07/04/2008  . HYPERCHOLESTEROLEMIA 07/04/2008  . Essential hypertension 07/04/2008  . GERD 07/04/2008  . DUODENITIS, WITHOUT HEMORRHAGE 07/04/2008  . HIATAL HERNIA 07/04/2008  . Arthropathy 07/04/2008    Otelia Limes, PTA 12/23/2015, 11:24 AM  [place], [place], [address] Phone: [phone]   Fax:  [phone]  Name: Donna May MRN: [id number] Date of Birth: Aug 12, 1940

## 2015-12-26 ENCOUNTER — Ambulatory Visit: Payer: Medicare Other

## 2015-12-26 DIAGNOSIS — G8929 Other chronic pain: Secondary | ICD-10-CM | POA: Diagnosis not present

## 2015-12-26 DIAGNOSIS — I972 Postmastectomy lymphedema syndrome: Secondary | ICD-10-CM

## 2015-12-26 DIAGNOSIS — M25511 Pain in right shoulder: Secondary | ICD-10-CM | POA: Diagnosis not present

## 2015-12-26 NOTE — Therapy (Signed)
[place], [place], [address] Phone: [phone]   Fax:  [phone]  Physical Therapy Treatment  Patient Details  Name: Donna May MRN: [id number] Date of Birth: 07/19/1940 Referring Provider: Lurline Del, MD  Encounter Date: 12/26/2015      PT End of Session - 12/26/15 1047    Visit Number 6   Number of Visits 19   Date for PT Re-Evaluation 01/17/16   PT Start Time 1019   PT Stop Time 1100   PT Time Calculation (min) 41 min   Activity Tolerance Patient tolerated treatment well   Behavior During Therapy [facility] for tasks assessed/performed      Past Medical History:  Diagnosis Date  . Anemia   . Arthritis   . Breast cancer ([place]) 1992  . Diabetes mellitus    type 2  . Duodenitis 01/18/2002  . Fainting spell   . GERD (gastroesophageal reflux disease)   . Heart murmur   . Hiatal hernia 08/08/2008, 01/18/2002  . History of pneumonia   . Hyperlipidemia   . Hypertension   . UTI (lower urinary tract infection)     Past Surgical History:  Procedure Laterality Date  . AXILLARY SURGERY     cyst removal, right  . MASTECTOMY  1992   right, with flap  . NM MYOCAR PERF WALL MOTION  06/11/2009   Protocol:Bruce, post stress EF58%, EKG negative for ischemia, low risk  . TONSILLECTOMY    . TRANSTHORACIC ECHOCARDIOGRAM  12/24/2009   LVEF =>55%, normal study    There were no vitals filed for this visit.      Subjective Assessment - 12/26/15 1022    Subjective Been wearing my sleeve               LYMPHEDEMA/ONCOLOGY QUESTIONNAIRE - 12/26/15 1022      Right Upper Extremity Lymphedema   15 cm Proximal to Olecranon Process 35.1 cm   10 cm Proximal to Olecranon Process 36.5 cm   Olecranon Process 31.3 cm   15 cm Proximal to Ulnar Styloid Process 28.6 cm   10 cm Proximal to Ulnar Styloid Process 24.4 cm   Just Proximal to Ulnar Styloid Process 19 cm   Across Hand at [employer] 19 cm   At [place] of 2nd Digit 6.5 cm                  [facility] Adult PT Treatment/Exercise - 12/26/15 0001      Manual Therapy   Manual Therapy Manual Lymphatic Drainage (MLD);Other (comment)   Manual therapy comments Circumference measurements taken; then after manual lymph drainage applied pts Rt UE Solaris Ready Wrap garment to her arm and the gauntlet, but 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    Manual Lymphatic Drainage (MLD) In supine, short neck, 5 diaphragmatic breaths, right inguinal nodes with right axillo-inguinal anastamosis, left axillary nodes with anterior interaxillary anastamosis, right shoulder upper arm, forearm and hand with return along pathways instructing pt throughout and having her practice at each postition performed by Saverio Danker, SPT directly observed by Collie Siad, PTA.                 PT Education - 12/26/15 1056    Education provided Yes   Education Details Review of self MLD while performing and had pt return demo briefly. Lymphedema risk reduction practices and infection prevention.    Person(s) Educated Patient   Methods Demonstration;Explanation;Handout   Comprehension  Verbalized understanding;Returned demonstration;Need further instruction           Short Term Clinic Goals - 12/26/15 1125      CC Short Term Goal  #1   Title Patient will be independent in self manual lymph drainage   Status Partially Met     CC Short Term Goal  #2   Title Decrease circumference measurement across hand at thumb webspace to 19.5 cm or less   Baseline 20.2 cm at eval; 20.4 cm 12/12/15 (bandages off since 12/10/15); 19.7 cm 12/17/15; 19 cm 12/26/15   Status Achieved     CC Short Term Goal  #3   Title Patient will be knowledgeable in lymphedema risk reduction practices.   Status Achieved             Long Term Clinic Goals - 12/26/15 1118      CC Long Term Goal  #1   Title Patient will be knowledgeable about obtaining and  wearing compression garments   Baseline She is independent with wear of her Solaris Ready Wrap, but will need to be measured for a compression sleeve as she hopefully reduces firther with wear of this 12/26/15   Status Partially Met     CC Long Term Goal  #2   Title Decrease circumference measurement at 10 cm proximal to olecranon to 34.5 cm or less   Baseline 36.9 at eval; 36.3 12/17/15; 36.5 cm (but pt had good reductions at forearm) 12/26/15   Status On-going     CC Long Term Goal  #3   Title Patient will be able to don/doff compression garments independently   Baseline Independent with Solaris garment 12/26/15   Status Partially Met     CC Long Term Goal  #4   Title Reduce Lymphedema Life Impact score to 15% or less.    Status On-going            Plan - 12/26/15 1048    Clinical Impression Statement Pt had good reductions at her forearm from last session showing her [place] is helping to control her swelling. She did well with review of manual lymph drainage today and she was instructed in importance of being consistent with her wearing of her garment and performing self MLD daily. She would like to reduce to 1x/week for next few weeks and then hopefully can get her mesured for her compression sleeve at that time.    Rehab Potential Good   Clinical Impairments Affecting Rehab Potential history of right axillary lymph node dissection (in 1992)   PT Frequency 1x / week  Pt wants to reduce to 1w/week   PT Duration 6 weeks   PT Treatment/Interventions ADLs/Self Care Home Management;Therapeutic exercise;DME Instruction;Patient/family education;Manual techniques;Manual lymph drainage;Compression bandaging;Passive range of motion;Taping   PT Next Visit Plan Pt coming 1x/week next few weeks. Circumference measurements to be taken at each next few visits to assess her swelling and to reassess self MLD prn.   Consulted and Agree with Plan of Care Patient      Patient will  benefit from skilled therapeutic intervention in order to improve the following deficits and impairments:  Impaired UE functional use, Increased edema, Pain  Visit Diagnosis: Postmastectomy lymphedema  Right shoulder pain, unspecified chronicity     Problem List Patient Active Problem List   Diagnosis Date Noted  . Ductal carcinoma in situ (DCIS) of right breast 11/26/2015  . Lymphedema of arm 11/26/2015  . Lymphedema 11/01/2015  . Leg pain, bilateral 12/28/2013  .  Special screening for malignant neoplasms, colon 08/09/2013  . Renal artery stenosis, native, bilateral ([place]) 06/09/2013  . Diabetes mellitus with renal complications ([place]) 27/02/9289  . RASH AND OTHER NONSPECIFIC SKIN ERUPTION 07/05/2008  . Malignant neoplasm of female breast ([place]) 07/04/2008  . HYPERCHOLESTEROLEMIA 07/04/2008  . Essential hypertension 07/04/2008  . GERD 07/04/2008  . DUODENITIS, WITHOUT HEMORRHAGE 07/04/2008  . HIATAL HERNIA 07/04/2008  . Arthropathy 07/04/2008    Otelia Limes, PTA 12/26/2015, 11:26 AM  [place], [place], [address] Phone: [phone]   Fax:  [phone]  Name: AALIAYAH MIAO MRN: [id number] Date of Birth: 08-30-1940

## 2015-12-27 ENCOUNTER — Ambulatory Visit (HOSPITAL_COMMUNITY)
Admission: RE | Admit: 2015-12-27 | Discharge: 2015-12-27 | Disposition: A | Discharge status: Home / Self Care | Payer: Medicare Other | Source: Ambulatory Visit | Attending: Oncology | Admitting: Oncology

## 2015-12-27 ENCOUNTER — Ambulatory Visit (HOSPITAL_BASED_OUTPATIENT_CLINIC_OR_DEPARTMENT_OTHER): Payer: Medicare Other | Admitting: Oncology

## 2015-12-27 VITALS — BP 166/67 | HR 80 | Temp 98.2°F | Resp 18 | Ht 65.0 in | Wt 171.6 lb

## 2015-12-27 DIAGNOSIS — I890 Lymphedema, not elsewhere classified: Secondary | ICD-10-CM

## 2015-12-27 DIAGNOSIS — Z23 Encounter for immunization: Secondary | ICD-10-CM

## 2015-12-27 DIAGNOSIS — C50911 Malignant neoplasm of unspecified site of right female breast: Secondary | ICD-10-CM | POA: Insufficient documentation

## 2015-12-27 DIAGNOSIS — Z853 Personal history of malignant neoplasm of breast: Secondary | ICD-10-CM | POA: Diagnosis not present

## 2015-12-27 DIAGNOSIS — Z171 Estrogen receptor negative status [ER-]: Secondary | ICD-10-CM

## 2015-12-27 DIAGNOSIS — R937 Abnormal findings on diagnostic imaging of other parts of musculoskeletal system: Secondary | ICD-10-CM | POA: Diagnosis not present

## 2015-12-27 MED ORDER — INFLUENZA VAC SPLIT QUAD 0.5 ML IM SUSY
0.5000 mL | PREFILLED_SYRINGE | Freq: Once | INTRAMUSCULAR | Status: AC
  Administered 2015-12-27: 0.5 mL via INTRAMUSCULAR
  Filled 2015-12-27: qty 0.5

## 2015-12-27 NOTE — Addendum Note (Signed)
Addended by: Laureen Abrahams on: 12/27/2015 02:17 PM   Modules accepted: Orders

## 2015-12-27 NOTE — Progress Notes (Signed)
[place]  [phone] [phone]     ID: Donna May DOB: 1941-03-16  MR#: [id number]  QAS#:[id number]  Patient Care Team: Marletta Lor, MD as PCP - General (Internal Medicine) Chauncey Cruel, MD as Consulting Physician (Oncology) Sanda Klein, MD as Consulting Physician (Cardiology) [name], MD as Consulting Physician (Obstetrics and Gynecology) OTHER MD:  CHIEF COMPLAINT: Right upper extremity lymphedema and a history of breast cancer  CURRENT TREATMENT: Observation   BREAST CANCER HISTORY: From the earlier summary note:  Jesyca remembers the single visit I paId her after her March 1992 mastectomy and axillary lymph node dissection under [name]. She recalls that I told her she had a noninvasive tumor and that she was very likely cured. She required no follow-up and no antiestrogen therapy.  In fact she did well until early August when she noted some swelling in her right upper extremity. She already had planned a trip to [place] for medication and she did fly to [place] and back. After her return she brought the right upper extremity problem (which had somewhat worsened with the flights) to medical attention, and she was set up for a Doppler ultrasound of the right arm, which was performed 10/29/2015. This showed no evidence of thrombus in the deep or superficial veins of the right upper extremity.  She had bilateral screening mammography when [facility] 10/29/2015 and this showed no significant findings. The patient was referred to [facility] where on 10/31/2015 she had right axillary ultrasonography. This showed a lymph node with lobulated margins in the right axilla which was felt to be probably benign. Ultrasound in 6 months was recommended.   We were then contacted for further evaluation and set the patient up for chest CT scan and bone scan, which have not yet been performed.   INTERVAL HISTORY: Normal returns today  for further follow-up of her right upper extremity lymphedema. We evaluated this with a bone scan and CT scan of the chest. Luckily there was no evidence of breast cancer recurrence.  The bone scan did show one spot in the left subtrochanteric femur. This suggested we obtain some plain films on this and we are doing that today.  Since last visit here Seline has been receiving his ago therapy for the right upper extremity lymphedema. She has learned how to do massage. She has purchased a compression sleeve through the Internet. She feels the right upper extremity swelling is decreasing.  REVIEW OF SYSTEMS: She had what sounds like an episode of bronchitis possibly with an asthmatic component. She was seen for this in the emergency room 12/18/2015 and given 3 days of prednisone, no antibiotics. That did not help the cough but the wheezing is a bit better. She does describe herself is moderately short of breath with moderate activity. She has arthritis involving the back and other joints but not particularly the left hip. She has hot flashes. She feels that the right belly area is bigger than the left belly area. Aside from these issues a detailed review of systems today was noncontributory  PAST MEDICAL HISTORY: Past Medical History:  Diagnosis Date  . Anemia   . Arthritis   . Breast cancer ([place]) 1992  . Diabetes mellitus    type 2  . Duodenitis 01/18/2002  . Fainting spell   . GERD (gastroesophageal reflux disease)   . Heart murmur   . Hiatal hernia 08/08/2008, 01/18/2002  . History of pneumonia   . Hyperlipidemia   .  Hypertension   . UTI (lower urinary tract infection)     PAST SURGICAL HISTORY: Past Surgical History:  Procedure Laterality Date  . AXILLARY SURGERY     cyst removal, right  . MASTECTOMY  1992   right, with flap  . NM MYOCAR PERF WALL MOTION  06/11/2009   Protocol:Bruce, post stress EF58%, EKG negative for ischemia, low risk  . TONSILLECTOMY    . TRANSTHORACIC  ECHOCARDIOGRAM  12/24/2009   LVEF =>55%, normal study    FAMILY HISTORY Family History  Problem Relation Age of Onset  . Breast cancer Cousin   . Diabetes Brother   . Heart disease Mother   . Hypertension Mother   . Heart disease Father   . Hypertension Father   . Prostate cancer Brother   . Heart attack Maternal Grandmother   . Stroke Maternal Grandfather   The patient's father died at age 77. He had significant heart disease. The patient's mother died at age 77 with a history of multiple sclerosis. The patient had 2 brothers, no sisters. One brother died with heart failure at age 77. The other brother has a history of prostate cancer.  GYNECOLOGIC HISTORY:  No LMP recorded. Patient is postmenopausal. Menarche age 39 first live birth age 75 the patient is [place] P2. She stopped having periods at the time of her surgery March 1992. She did not use hormone replacement. 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 She describes herself as single and lives alone with no pets. Her son Harryette Shuart is Leisure centre manager of police here in [place] and her daughter [name] is an Quarry manager also in [place]. The patient has 4 granddaughters and 1 grandson. She attends a [employer] DIRECTIVES: Not in place but the patient tells me she intends to name her son [name] as her healthcare part of attorney. His cell number is [phone]; his work pager number is [phone], and his home [phone].   HEALTH MAINTENANCE: Social History  Substance Use Topics  . Smoking status: Former Research scientist (life sciences)  . Smokeless tobacco: Never Used  . Alcohol use 0.0 oz/week     Comment: occ     Colonoscopy:  PAP:  Bone density:   No Known Allergies  Current Outpatient Prescriptions  Medication Sig Dispense Refill  . albuterol (PROVENTIL  HFA;VENTOLIN HFA) 108 (90 Base) MCG/ACT inhaler Inhale 1-2 puffs into the lungs every 6 (six) hours as needed for wheezing or shortness of breath. 1 Inhaler 0  . AMBULATORY NON FORMULARY MEDICATION Place 0.0125 g rectally 2 (two) times daily. Medication Name: Nitroglycerin ointment .7903. Apply 2-3 times daily as needed. (Patient taking differently: Place 0.0125 g rectally 2 (two) times daily as needed (pain in rectum). Medication Name: Nitroglycerin ointment .8333. Apply 2-3 times daily as needed.) 1 Tube 1  . amLODipine (NORVASC) 10 MG tablet TAKE 1 TABLET (10 MG TOTAL) BY MOUTH DAILY. NEED OFFICE VISIT 90 tablet 3  . aspirin 81 MG tablet Take 81 mg by mouth daily.    Marland Kitchen CALCIUM-MAGNESIUM-VITAMIN D PO Take 1 tablet by mouth daily.    . Cholecalciferol (VITAMIN D3) 2000 UNITS TABS Take 1 tablet by mouth daily.    . CRESTOR 20 MG tablet Take 1 tablet (20 mg total) by mouth daily. 90 tablet 0  . Echinacea 400 MG CAPS Take 1 capsule by mouth daily.    Marland Kitchen  EVISTA 60 MG tablet Take 60 mg by mouth daily.     Marland Kitchen JANUVIA 100 MG tablet Take 100 mg by mouth daily.     . lansoprazole (PREVACID) 30 MG capsule TAKE 1 CAPSULE DAILY BEFOREBREAKFAST 90 capsule 1  . metFORMIN (GLUCOPHAGE-XR) 500 MG 24 hr tablet Take 500 mg by mouth 2 (two) times daily before a meal.     . metoprolol succinate (TOPROL-XL) 100 MG 24 hr tablet Take 1 tablet (100 mg total) by mouth daily. Take with or immediately following a meal. 90 tablet 1  . Multiple Vitamins-Minerals (CENTRUM SILVER ULTRA WOMENS) TABS Take 1 tablet by mouth daily.    . Omega 3 1000 MG CAPS Take 1 capsule by mouth daily.    . predniSONE (DELTASONE) 20 MG tablet Take 3 tablets (60 mg total) by mouth daily. (Patient not taking: Reported on 12/23/2015) 9 tablet 0  . valsartan-hydrochlorothiazide (DIOVAN-HCT) 320-12.5 MG tablet TAKE 1 TABLET BY MOUTH DAILY. NEED OV. 90 tablet 1   Current Facility-Administered Medications  Medication Dose Route Frequency Provider Last Rate  Last Dose  . triamcinolone acetonide (KENALOG) 10 MG/ML injection 10 mg  10 mg Other Once Harriet Masson, DPM        OBJECTIVE: Older African-American woman Who appears stated age 4:   12/27/15 1206  BP: (!) 166/67  Pulse: 80  Resp: 18  Temp: 98.2 F (36.8 C)     Body mass index is 28.56 kg/m.    ECOG FS:1 - Symptomatic but completely ambulatory  Sclerae unicteric, EOMs intact Oropharynx clear and moist No cervical or supraclavicular adenopathy Lungs no rales or rhonchi Heart regular rate and rhythm Abd soft, nontender, positive bowel sounds MSK no focal spinal tenderness, grade 1 right upper extremity lymphedema, no left hip tenderness Neuro: nonfocal, well oriented, appropriate affect Breasts: Deferred   LAB RESULTS:  CMP     Component Value Date/Time   NA 138 12/17/2015 2255   NA 144 11/19/2015 0921   K 3.7 12/17/2015 2255   K 4.8 11/19/2015 0921   CL 104 12/17/2015 2255   CO2 25 12/17/2015 2255   CO2 27 11/19/2015 0921   GLUCOSE 126 (H) 12/17/2015 2255   GLUCOSE 109 11/19/2015 0921   BUN 14 12/17/2015 2255   BUN 14.4 11/19/2015 0921   CREATININE 1.13 (H) 12/17/2015 2255   CREATININE 1.2 (H) 11/19/2015 0921   CALCIUM 9.1 12/17/2015 2255   CALCIUM 9.8 11/19/2015 0921   PROT 6.5 12/17/2015 2255   PROT 7.2 11/19/2015 0921   ALBUMIN 3.5 12/17/2015 2255   ALBUMIN 3.6 11/19/2015 0921   AST 23 12/17/2015 2255   AST 20 11/19/2015 0921   ALT 15 12/17/2015 2255   ALT 14 11/19/2015 0921   ALKPHOS 50 12/17/2015 2255   ALKPHOS 59 11/19/2015 0921   BILITOT 0.6 12/17/2015 2255   BILITOT 0.41 11/19/2015 0921   GFRNONAA 46 (L) 12/17/2015 2255   GFRAA 53 (L) 12/17/2015 2255    INo results found for: SPEP, UPEP  Lab Results  Component Value Date   WBC 9.0 12/17/2015   NEUTROABS 6.7 12/17/2015   HGB 12.3 12/17/2015   HCT 38.9 12/17/2015   MCV 89.8 12/17/2015   PLT 234 12/17/2015      Chemistry      Component Value Date/Time   NA 138 12/17/2015 2255    NA 144 11/19/2015 0921   K 3.7 12/17/2015 2255   K 4.8 11/19/2015 0921   CL 104 12/17/2015 2255   CO2  25 12/17/2015 2255   CO2 27 11/19/2015 0921   BUN 14 12/17/2015 2255   BUN 14.4 11/19/2015 0921   CREATININE 1.13 (H) 12/17/2015 2255   CREATININE 1.2 (H) 11/19/2015 0921   GLU 97 01/28/2015      Component Value Date/Time   CALCIUM 9.1 12/17/2015 2255   CALCIUM 9.8 11/19/2015 0921   ALKPHOS 50 12/17/2015 2255   ALKPHOS 59 11/19/2015 0921   AST 23 12/17/2015 2255   AST 20 11/19/2015 0921   ALT 15 12/17/2015 2255   ALT 14 11/19/2015 0921   BILITOT 0.6 12/17/2015 2255   BILITOT 0.41 11/19/2015 0921       No results found for: LABCA2  No components found for: LABCA125  No results for input(s): INR in the last 168 hours.  Urinalysis No results found for: COLORURINE, APPEARANCEUR, LABSPEC, PHURINE, GLUCOSEU, HGBUR, BILIRUBINUR, KETONESUR, PROTEINUR, UROBILINOGEN, NITRITE, LEUKOCYTESUR   STUDIES: Dg Chest 2 View  Result Date: 12/17/2015 CLINICAL DATA:  75 y/o F; cough with shortness of breath and wheezing. EXAM: CHEST  2 VIEW COMPARISON:  01/26/2009 chest radiograph. FINDINGS: The heart size and mediastinal contours are within normal limits and stable. Both lungs are clear. Mild S-shaped curvature of the spine. Surgical clips project over right axilla and breast. IMPRESSION: No active cardiopulmonary disease. Electronically Signed   By: Kristine Garbe M.D.   On: 12/17/2015 19:35   Ct Chest W Contrast  Result Date: 12/02/2015 CLINICAL DATA:  Breast cancer.  Evaluate for metastatic disease. EXAM: CT CHEST WITH CONTRAST TECHNIQUE: Multidetector CT imaging of the chest was performed during intravenous contrast administration. CONTRAST:  61mL ISOVUE-300 IOPAMIDOL (ISOVUE-300) INJECTION 61% COMPARISON:  None. FINDINGS: Cardiovascular: The heart size is normal. No pericardial effusion identified. Aortic atherosclerosis. Calcifications in the LAD coronary artery noted.  Mediastinum/Nodes: The trachea appears patent and is midline. Normal appearance of the esophagus. No enlarged mediastinal or hilar lymph nodes. Lungs/Pleura: No pleural effusions. No suspicious pulmonary nodules. Upper Abdomen: The adrenal glands appear normal. The visualized portions of the pancreas and spleen are normal. Multiple low-attenuation foci within the liver are identified. Most of these measure fluid attenuation and are favored to represent benign cysts. Some of these are less than 1 cm and too small to reliably characterize. Musculoskeletal: Mild scoliosis and multi level degenerative disc disease. IMPRESSION: 1. No acute findings and no specific findings to suggest metastatic disease. 2. Aortic atherosclerosis and LAD coronary artery calcification 3. Low-attenuation foci within the liver are noted, likely cysts. Electronically Signed   By: Kerby Moors M.D.   On: 12/02/2015 14:58   Nm Bone Scan Whole Body  Result Date: 12/02/2015 CLINICAL DATA:  History of breast carcinoma, swelling of the lateral arm, evaluate for metastatic disease EXAM: NUCLEAR MEDICINE WHOLE BODY BONE SCAN TECHNIQUE: Whole body anterior and posterior images were obtained approximately 3 hours after intravenous injection of radiopharmaceutical. RADIOPHARMACEUTICALS:  20.5 mCi Technetium-30m MDP IV COMPARISON:  None. FINDINGS: The only questionable abnormal focus of increased activity on total body bone scan is with the left subtrochanteric femur. Minimally increased activity is seen within the right shoulder, knees, and feet, most consistent with degenerative change. IMPRESSION: Only a single subtle focus of activity is noted in the left subtrochanteric femur. Plain film correlation may be helpful. Electronically Signed   By: Ivar Drape M.D.   On: 12/02/2015 16:48    ELIGIBLE FOR AVAILABLE RESEARCH PROTOCOL: NO  ASSESSMENT: 75 y.o. [place] woman  (1) Status post right mastectomy and axillary lymph node  dissection  March 1992 for what by the patient's recollection was a noninvasive breast cancer  (2) right upper extremity lymphedema developing August 2017  (a) right upper extremity Doppler ultrasonography 10/29/2015 shows no evidence of clot  (b) right axillary ultrasonography at [facility] 10/31/2015 shows a single likely benign lymph node  (c) chest CT and bone scan showed no evidence of recurrence or metastatic disease  PLAN: [place] I was pleased with the good news that she does not have incurable recurrent breast cancer, but on the other hand she does have the right upper extremity lymphedema. The best explanation I can offer is that over many years patient's scar tissue develops cause this problem.  She is benefiting from physical therapy, she is following their instructions faithfully, and she has got herself a compression sleeve which is also going to help.  At this point I think she would be a good candidate for transitioning to our survivorship nurse practitioner and I am making her an appointment with her for about 6 months from now.  She requested a flu shot today which we were glad to provide  Of course Ms. Peale call for any other issues that may develop before her return visit      Chauncey Cruel, MD   12/27/2015 12:51 PM Medical Oncology and Hematology [facility] [place], [place] [address] Tel. [phone]    Fax. [phone]

## 2015-12-29 ENCOUNTER — Encounter: Payer: Self-pay | Admitting: Oncology

## 2016-01-02 ENCOUNTER — Ambulatory Visit: Payer: Medicare Other

## 2016-01-02 DIAGNOSIS — I972 Postmastectomy lymphedema syndrome: Secondary | ICD-10-CM

## 2016-01-02 DIAGNOSIS — M25511 Pain in right shoulder: Secondary | ICD-10-CM | POA: Diagnosis not present

## 2016-01-02 DIAGNOSIS — G8929 Other chronic pain: Secondary | ICD-10-CM | POA: Diagnosis not present

## 2016-01-02 NOTE — Therapy (Signed)
[place], [place], [address] Phone: [phone]   Fax:  [phone]  Physical Therapy Treatment  Patient Details  Name: Donna May MRN: [id number] Date of Birth: [birth year] Referring Provider: Lurline Del, MD  Encounter Date: 01/02/2016      PT End of Session - 01/02/16 1035    Visit Number 7   Number of Visits 19   Date for PT Re-Evaluation 01/17/16   PT Start Time 1017   PT Stop Time 1100   PT Time Calculation (min) 43 min   Activity Tolerance Patient tolerated treatment well   Behavior During Therapy [facility] for tasks assessed/performed      Past Medical History:  Diagnosis Date  . Anemia   . Arthritis   . Breast cancer ([place]) 1992  . Diabetes mellitus    type 2  . Duodenitis 01/18/2002  . Fainting spell   . GERD (gastroesophageal reflux disease)   . Heart murmur   . Hiatal hernia 08/08/2008, 01/18/2002  . History of pneumonia   . Hyperlipidemia   . Hypertension   . UTI (lower urinary tract infection)     Past Surgical History:  Procedure Laterality Date  . AXILLARY SURGERY     cyst removal, right  . MASTECTOMY  1992   right, with flap  . NM MYOCAR PERF WALL MOTION  06/11/2009   Protocol:Bruce, post stress EF58%, EKG negative for ischemia, low risk  . TONSILLECTOMY    . TRANSTHORACIC ECHOCARDIOGRAM  12/24/2009   LVEF =>55%, normal study    There were no vitals filed for this visit.             LYMPHEDEMA/ONCOLOGY QUESTIONNAIRE - 01/02/16 1020      Right Upper Extremity Lymphedema   15 cm Proximal to Olecranon Process 35.1 cm   10 cm Proximal to Olecranon Process 35.4 cm   Olecranon Process 30.1 cm   15 cm Proximal to Ulnar Styloid Process 28 cm   10 cm Proximal to Ulnar Styloid Process 24.1 cm   Just Proximal to Ulnar Styloid Process 19 cm   Across Hand at [employer] 20 cm   At [place] of 2nd Digit 6.5 cm                  [facility] Adult PT  Treatment/Exercise - 01/02/16 0001      Manual Therapy   Manual Therapy Manual Lymphatic Drainage (MLD);Other (comment)   Manual therapy comments Circumference measurements taken; then after manual lymph drainage applied pts Rt UE Solaris Ready Wrap garment to her arm and the gauntlet, but 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    Manual Lymphatic Drainage (MLD) In supine, short neck, 5 diaphragmatic breaths, right inguinal nodes with right axillo-inguinal anastamosis, left axillary nodes with anterior interaxillary anastamosis, right shoulder upper arm, forearm and hand with return along pathways instructing pt throughout and having her practice a few postitions performed by Saverio Danker, SPT directly observed by Collie Siad, PTA.                    Short Term Clinic Goals - 01/02/16 1042      CC Short Term Goal  #1   Title Patient will be independent in self manual lymph drainage   Status Achieved             Long Term Clinic Goals - 01/02/16 1042      CC Long Term Goal  #1  Title Patient will be knowledgeable about obtaining and wearing compression garments   Baseline She is independent with wear of her Solaris Ready Wrap, but will need to be measured for a compression sleeve as she hopefully reduces further with wear of this-01/02/16   Status Partially Met     CC Long Term Goal  #2   Title Decrease circumference measurement at 10 cm proximal to olecranon to 34.5 cm or less   Baseline 36.9 at eval; 36.3 12/17/15; 36.5 cm (but pt had good reductions at forearm) 12/26/15; 35.4 cm 01/02/16   Status On-going     CC Long Term Goal  #3   Title Patient will be able to don/doff compression garments independently   Baseline Independent with Solaris garment 12/26/15   Status Partially Met     CC Long Term Goal  #4   Title Reduce Lymphedema Life Impact score to 15% or less.    Status On-going            Plan - 01/02/16 1035    Clinical Impression  Statement Pt overall continued with great reductions from last week and reports wearing the Solrais ReadyWrap has been going really for her. 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 Her hand was increased by 1 cm today and she reports she hasn't been wearing the gauntlet but she will be more compliant with this in the future.    Rehab Potential Good   Clinical Impairments Affecting Rehab Potential history of right axillary lymph node dissection (in 1992)   PT Frequency 1x / week  Pt wants to come 1x/wk   PT Duration 6 weeks   PT Treatment/Interventions ADLs/Self Care Home Management;Therapeutic exercise;DME Instruction;Patient/family education;Manual techniques;Manual lymph drainage;Compression bandaging;Passive range of motion;Taping   PT Next Visit Plan Pt coming 1x/week next few weeks. Circumference measurements to be taken at each next few visits to assess her swelling and to reassess self MLD prn. Pt possibly would like to be measured for a compression sleeve next week or 2 if her measurements keep reducing.    PT Home Exercise Plan Cont to wear her Solaris ReadyWrap and gauntlet and daily self MLD   Consulted and Agree with Plan of Care Patient      Patient will benefit from skilled therapeutic intervention in order to improve the following deficits and impairments:  Impaired UE functional use, Increased edema, Pain  Visit Diagnosis: Postmastectomy lymphedema  Right shoulder pain, unspecified chronicity     Problem List Patient Active Problem List   Diagnosis Date Noted  . Ductal carcinoma in situ (DCIS) of right breast 11/26/2015  . Lymphedema of arm 11/26/2015  . Lymphedema 11/01/2015  . Leg pain, bilateral 12/28/2013  . Special screening for malignant neoplasms, colon 08/09/2013  . Renal artery stenosis, native, bilateral ([place]) 06/09/2013  . Diabetes mellitus with renal complications ([place]) 25/36/6440  .  RASH AND OTHER NONSPECIFIC SKIN ERUPTION 07/05/2008  . Malignant neoplasm of female breast ([place]) 07/04/2008  . HYPERCHOLESTEROLEMIA 07/04/2008  . Essential hypertension 07/04/2008  . GERD 07/04/2008  . DUODENITIS, WITHOUT HEMORRHAGE 07/04/2008  . HIATAL HERNIA 07/04/2008  . Arthropathy 07/04/2008    Otelia Limes, PTA 01/02/2016, 10:45 AM  [place], [place], [address] Phone: [phone]   Fax:  [phone]  Name: KORENE DULA MRN: [id number] Date of Birth: 04-Jan-1941

## 2016-01-06 ENCOUNTER — Ambulatory Visit: Payer: Medicare Other

## 2016-01-09 ENCOUNTER — Ambulatory Visit: Payer: Medicare Other

## 2016-01-09 DIAGNOSIS — I972 Postmastectomy lymphedema syndrome: Secondary | ICD-10-CM | POA: Diagnosis not present

## 2016-01-09 DIAGNOSIS — G8929 Other chronic pain: Secondary | ICD-10-CM | POA: Diagnosis not present

## 2016-01-09 DIAGNOSIS — M25511 Pain in right shoulder: Secondary | ICD-10-CM

## 2016-01-09 NOTE — Therapy (Signed)
[place], [place], [address] Phone: [phone]   Fax:  [phone]  Physical Therapy Treatment  Patient Details  Name: Donna May MRN: [id number] Date of Birth: November 17, 1940 Referring Provider: Lurline Del, MD  Encounter Date: 01/09/2016      PT End of Session - 01/09/16 1211    Visit Number 8   Number of Visits 19   Date for PT Re-Evaluation 01/17/16   PT Start Time 1024   PT Stop Time 1108   PT Time Calculation (min) 44 min   Activity Tolerance Patient tolerated treatment well   Behavior During Therapy [facility] for tasks assessed/performed      Past Medical History:  Diagnosis Date  . Anemia   . Arthritis   . Breast cancer ([place]) 1992  . Diabetes mellitus    type 2  . Duodenitis 01/18/2002  . Fainting spell   . GERD (gastroesophageal reflux disease)   . Heart murmur   . Hiatal hernia 08/08/2008, 01/18/2002  . History of pneumonia   . Hyperlipidemia   . Hypertension   . UTI (lower urinary tract infection)     Past Surgical History:  Procedure Laterality Date  . AXILLARY SURGERY     cyst removal, right  . MASTECTOMY  1992   right, with flap  . NM MYOCAR PERF WALL MOTION  06/11/2009   Protocol:Bruce, post stress EF58%, EKG negative for ischemia, low risk  . TONSILLECTOMY    . TRANSTHORACIC ECHOCARDIOGRAM  12/24/2009   LVEF =>55%, normal study    There were no vitals filed for this visit.      Subjective Assessment - 01/09/16 1027    Subjective Been wearing my sleeve but I can't wear the hand piece, I just do too much with my hands it'll get dirty. But I massage it every night and it goes down (my hand).    Pertinent History Patient with a history of right mastectomy and axillary lymph node dissection in March of 1992, patient states she thinks about 5 lymph nodes were removed. She noticed swelling in her right UE at the beginning of August 2017.   Patient Stated Goals to get  the arm back down   Currently in Pain? No/denies               LYMPHEDEMA/ONCOLOGY QUESTIONNAIRE - 01/09/16 1028      Right Upper Extremity Lymphedema   15 cm Proximal to Olecranon Process 34 cm   10 cm Proximal to Olecranon Process 35.2 cm   Olecranon Process 30.4 cm   15 cm Proximal to Ulnar Styloid Process 29.1 cm   10 cm Proximal to Ulnar Styloid Process 24.4 cm   Just Proximal to Ulnar Styloid Process 19.3 cm   Across Hand at [employer] 20.4 cm   At [place] of 2nd Digit 6.7 cm                  [facility] Adult PT Treatment/Exercise - 01/09/16 0001      Manual Therapy   Manual Therapy Manual Lymphatic Drainage (MLD);Other (comment)   Manual therapy comments Circumference measurements taken; then after manual lymph drainage applied pts Rt UE Solaris Ready Wrap garment to her arm and the gauntlet, but 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    Manual Lymphatic Drainage (MLD) In supine, short neck, 5 diaphragmatic breaths, right inguinal nodes with right axillo-inguinal anastamosis, left axillary nodes with anterior interaxillary anastamosis, right shoulder upper arm,  forearm and hand with return along pathways instructing pt throughout and having her practice a few postitions performed by Saverio Danker, SPT directly observed by Collie Siad, PTA.    Compression Bandaging Assisted pt with donningher Solaris ReadyWrap compression garment after session                PT Education - 01/09/16 1220    Education provided Yes   Education Details Instructed pt that she may not see much further reduction of her UE at this time with use of Solaris garment as this isn't necessarily made for reduction, it's made for maintenance. Also issued Celine Ahr DVD to pt as she reports she can show her daughter who might can help pt with her MLD.   Person(s) Educated Patient   Methods Explanation  DVD   Comprehension Verbalized understanding           Short Term Clinic  Goals - 01/02/16 1042      CC Short Term Goal  #1   Title Patient will be independent in self manual lymph drainage   Status Achieved             Long Term Clinic Goals - 01/09/16 1219      CC Long Term Goal  #1   Title Patient will be knowledgeable about obtaining and wearing compression garments   Baseline She is independent with wear of her Solaris Ready Wrap, but will need to be measured for a compression sleeve as she hopefully reduces further with wear of this-01/02/16; issued info to get measured for her compression sleeve/glove at [facility] for class II-01/09/16   Status Partially Met     CC Long Term Goal  #2   Title Decrease circumference measurement at 10 cm proximal to olecranon to 34.5 cm or less   Baseline 36.9 at eval; 36.3 12/17/15; 36.5 cm (but pt had good reductions at forearm) 12/26/15; 35.4 cm 01/02/16; 35.2 cm 01/09/16   Status On-going     CC Long Term Goal  #3   Title Patient will be able to don/doff compression garments independently   Status Partially Met     CC Long Term Goal  #4   Title Reduce Lymphedema Life Impact score to 15% or less.    Status On-going            Plan - 01/09/16 1211    Clinical Impression Statement Pts circumference measurements were increased at her forearm and distal to this but her elbow maintained and her upper arm was reduced from last weeks measurements. Pt reports wearing her Solaris "most of the time" but take sit off when it starts to feel too tight. Also has been compliant with her self MLD daily though reports she could be doing this better (technique) and will work at this. She would like to wait oe more week before getting measured for her compression sleeve as she would like her arm to reduce more so instructed her in importance of continuing to be compliant and consistent with wear of her Solaris garment and taking time to perform correct technique with self MLD. Pt verbalized understanding all this. Also  issued [place] information with instruction for her to be measured for class II compression sleeve/glove when time comes and pt will schedule appt when she is pleased with arm circumference (may be before next appt).    Rehab Potential Good   Clinical Impairments Affecting Rehab Potential history of right axillary lymph node dissection (in 1992)  PT Frequency 1x / week  Pt wants 1x/week   PT Duration 6 weeks   PT Treatment/Interventions ADLs/Self Care Home Management;Therapeutic exercise;DME Instruction;Patient/family education;Manual techniques;Manual lymph drainage;Compression bandaging;Passive range of motion;Taping   PT Next Visit Plan Need to decide D/C (have pt retake LLIS) or renewal at next weeks appt though if pt ready to be measured for compression sleeve/glove she could be D/C with cont to wear her Solaris garment until sleeve arrives. Pt coming 1x/week next few weeks. Circumference measurements to be taken at each next few visits to assess her swelling and to reassess self MLD prn.    Consulted and Agree with Plan of Care Patient      Patient will benefit from skilled therapeutic intervention in order to improve the following deficits and impairments:  Impaired UE functional use, Increased edema, Pain  Visit Diagnosis: Postmastectomy lymphedema  Right shoulder pain, unspecified chronicity  Chronic right shoulder pain     Problem List Patient Active Problem List   Diagnosis Date Noted  . Ductal carcinoma in situ (DCIS) of right breast 11/26/2015  . Lymphedema of arm 11/26/2015  . Lymphedema 11/01/2015  . Leg pain, bilateral 12/28/2013  . Special screening for malignant neoplasms, colon 08/09/2013  . Renal artery stenosis, native, bilateral ([place]) 06/09/2013  . Diabetes mellitus with renal complications ([place]) 94/50/3888  . RASH AND OTHER NONSPECIFIC SKIN ERUPTION 07/05/2008  . Malignant neoplasm of female breast ([place]) 07/04/2008  . HYPERCHOLESTEROLEMIA  07/04/2008  . Essential hypertension 07/04/2008  . GERD 07/04/2008  . DUODENITIS, WITHOUT HEMORRHAGE 07/04/2008  . HIATAL HERNIA 07/04/2008  . Arthropathy 07/04/2008    Otelia Limes, PTA 01/09/2016, 12:22 PM  [place], [place], [address] Phone: [phone]   Fax:  [phone]  Name: Donna May MRN: [id number] Date of Birth: 03/31/1940

## 2016-01-13 ENCOUNTER — Ambulatory Visit: Payer: Medicare Other

## 2016-01-13 DIAGNOSIS — H20013 Primary iridocyclitis, bilateral: Secondary | ICD-10-CM | POA: Diagnosis not present

## 2016-01-13 DIAGNOSIS — M25511 Pain in right shoulder: Secondary | ICD-10-CM

## 2016-01-13 DIAGNOSIS — G8929 Other chronic pain: Secondary | ICD-10-CM | POA: Diagnosis not present

## 2016-01-13 DIAGNOSIS — I972 Postmastectomy lymphedema syndrome: Secondary | ICD-10-CM | POA: Diagnosis not present

## 2016-01-13 NOTE — Therapy (Signed)
[place], [place], [address] Phone: [phone]   Fax:  [phone]  Physical Therapy Treatment  Patient Details  Name: Donna May MRN: [id number] Date of Birth: 11/24/1940 Referring Provider: Lurline Del, MD  Encounter Date: 01/13/2016      PT End of Session - 01/13/16 1208    Visit Number 9   Number of Visits 19   Date for PT Re-Evaluation 01/17/16   PT Start Time 1022   PT Stop Time 1102   PT Time Calculation (min) 40 min   Activity Tolerance Patient tolerated treatment well   Behavior During Therapy [facility] for tasks assessed/performed      Past Medical History:  Diagnosis Date  . Anemia   . Arthritis   . Breast cancer ([place]) 1992  . Diabetes mellitus    type 2  . Duodenitis 01/18/2002  . Fainting spell   . GERD (gastroesophageal reflux disease)   . Heart murmur   . Hiatal hernia 08/08/2008, 01/18/2002  . History of pneumonia   . Hyperlipidemia   . Hypertension   . UTI (lower urinary tract infection)     Past Surgical History:  Procedure Laterality Date  . AXILLARY SURGERY     cyst removal, right  . MASTECTOMY  1992   right, with flap  . NM MYOCAR PERF WALL MOTION  06/11/2009   Protocol:Bruce, post stress EF58%, EKG negative for ischemia, low risk  . TONSILLECTOMY    . TRANSTHORACIC ECHOCARDIOGRAM  12/24/2009   LVEF =>55%, normal study    There were no vitals filed for this visit.      Subjective Assessment - 01/13/16 1026    Subjective Continuing to wear my compression garment, only complaint is the forearm doesn't reduce well.    Pertinent History Patient with a history of right mastectomy and axillary lymph node dissection in March of 1992, patient states she thinks about 5 lymph nodes were removed. She noticed swelling in her right UE at the beginning of August 2017.   Patient Stated Goals to get the arm back down   Currently in Pain? No/denies                LYMPHEDEMA/ONCOLOGY QUESTIONNAIRE - 01/13/16 1027      Right Upper Extremity Lymphedema   15 cm Proximal to Olecranon Process 34.4 cm   10 cm Proximal to Olecranon Process 35.7 cm   Olecranon Process 29.7 cm   15 cm Proximal to Ulnar Styloid Process 28.9 cm   10 cm Proximal to Ulnar Styloid Process 25.1 cm   Across Hand at [employer] 19.5 cm   At [place] of 2nd Digit 6.7 cm                  [facility] Adult PT Treatment/Exercise - 01/13/16 0001      Manual Therapy   Manual therapy comments Circumference measurements taken; cut and issued long rectangular piece of 1/4" foam for pt to wear at forearm and antecubital fossa   Manual Lymphatic Drainage (MLD) In supine, short neck, 5 diaphragmatic breaths, right inguinal nodes with right axillo-inguinal anastamosis, left axillary nodes with anterior interaxillary anastamosis, right shoulder upper arm, forearm and hand with return along pathways instructing pt throughout and having her practice a few postitions performed by Saverio Danker, SPT directly observed by Collie Siad, PTA.                    Short Term  Clinic Goals - 01/02/16 1042      CC Short Term Goal  #1   Title Patient will be independent in self manual lymph drainage   Status Achieved             Long Term Clinic Goals - 01/09/16 1219      CC Long Term Goal  #1   Title Patient will be knowledgeable about obtaining and wearing compression garments   Baseline She is independent with wear of her Solaris Ready Wrap, but will need to be measured for a compression sleeve as she hopefully reduces further with wear of this-01/02/16; issued info to get measured for her compression sleeve/glove at [facility] for class II-01/09/16   Status Partially Met     CC Long Term Goal  #2   Title Decrease circumference measurement at 10 cm proximal to olecranon to 34.5 cm or less   Baseline 36.9 at eval; 36.3 12/17/15; 36.5 cm (but pt had good  reductions at forearm) 12/26/15; 35.4 cm 01/02/16; 35.2 cm 01/09/16   Status On-going     CC Long Term Goal  #3   Title Patient will be able to don/doff compression garments independently   Status Partially Met     CC Long Term Goal  #4   Title Reduce Lymphedema Life Impact score to 15% or less.    Status On-going            Plan - 01/13/16 1209    Clinical Impression Statement Pts forearm measurements had fluctuated from last week, 0.6 cm increase at 10 cm prox to ulnar styloid process, but decreased 0.2 cm at 15 cm prox to. Her upper arm was slightly increased though pt reports hasn't worn her compression sleeve since yesterday which accounts for fluctuations with her measurements. Discussed with pt about when she'd like to get measured fo rher compression sleeve and she reports probably will call to make an appt this week.    Rehab Potential Good   Clinical Impairments Affecting Rehab Potential history of right axillary lymph node dissection (in 1992)   PT Frequency 2x / week  Pt mostly still wanting to come 1x/week   PT Duration 6 weeks   PT Treatment/Interventions ADLs/Self Care Home Management;Therapeutic exercise;DME Instruction;Patient/family education;Manual techniques;Manual lymph drainage;Compression bandaging;Passive range of motion;Taping   PT Next Visit Plan Need to decide D/C (have pt retake LLIS) or renewal at next appt though if pt ready to be measured for compression sleeve/glove she could be D/C with cont to wear her Solaris garment until sleeve arrives. Pt coming 1x/week next few weeks.    PT Home Exercise Plan Cont to wear her Solaris ReadyWrap and gauntlet and daily self MLD   Consulted and Agree with Plan of Care Patient      Patient will benefit from skilled therapeutic intervention in order to improve the following deficits and impairments:  Impaired UE functional use, Increased edema, Pain  Visit Diagnosis: Postmastectomy lymphedema  Right shoulder pain,  unspecified chronicity     Problem List Patient Active Problem List   Diagnosis Date Noted  . Ductal carcinoma in situ (DCIS) of right breast 11/26/2015  . Lymphedema of arm 11/26/2015  . Lymphedema 11/01/2015  . Leg pain, bilateral 12/28/2013  . Special screening for malignant neoplasms, colon 08/09/2013  . Renal artery stenosis, native, bilateral ([place]) 06/09/2013  . Diabetes mellitus with renal complications ([place]) 29/93/7169  . RASH AND OTHER NONSPECIFIC SKIN ERUPTION 07/05/2008  . Malignant neoplasm of female breast (  [place]) 07/04/2008  . HYPERCHOLESTEROLEMIA 07/04/2008  . Essential hypertension 07/04/2008  . GERD 07/04/2008  . DUODENITIS, WITHOUT HEMORRHAGE 07/04/2008  . HIATAL HERNIA 07/04/2008  . Arthropathy 07/04/2008    Otelia Limes, PTA 01/13/2016, 12:27 PM  [place], [place], [address] Phone: [phone]   Fax:  [phone]  Name: Donna May MRN: [id number] Date of Birth: 10/28/1940

## 2016-01-16 ENCOUNTER — Ambulatory Visit: Payer: Medicare Other | Attending: Oncology

## 2016-01-16 DIAGNOSIS — I972 Postmastectomy lymphedema syndrome: Secondary | ICD-10-CM | POA: Insufficient documentation

## 2016-01-16 DIAGNOSIS — G8929 Other chronic pain: Secondary | ICD-10-CM | POA: Insufficient documentation

## 2016-01-16 DIAGNOSIS — M25511 Pain in right shoulder: Secondary | ICD-10-CM | POA: Insufficient documentation

## 2016-01-16 NOTE — Therapy (Signed)
[place], [place], [address] Phone: [phone]   Fax:  [phone]  Physical Therapy Treatment  Patient Details  Name: Donna May MRN: [id number] Date of Birth: 05-05-1940 Referring Provider: Lurline Del, MD  Encounter Date: 01/16/2016    Past Medical History:  Diagnosis Date  . Anemia   . Arthritis   . Breast cancer ([place]) 1992  . Diabetes mellitus    type 2  . Duodenitis 01/18/2002  . Fainting spell   . GERD (gastroesophageal reflux disease)   . Heart murmur   . Hiatal hernia 08/08/2008, 01/18/2002  . History of pneumonia   . Hyperlipidemia   . Hypertension   . UTI (lower urinary tract infection)     Past Surgical History:  Procedure Laterality Date  . AXILLARY SURGERY     cyst removal, right  . MASTECTOMY  1992   right, with flap  . NM MYOCAR PERF WALL MOTION  06/11/2009   Protocol:Bruce, post stress EF58%, EKG negative for ischemia, low risk  . TONSILLECTOMY    . TRANSTHORACIC ECHOCARDIOGRAM  12/24/2009   LVEF =>55%, normal study    There were no vitals filed for this visit.                                  Short Term Clinic Goals - 01/02/16 1042      CC Short Term Goal  #1   Title Patient will be independent in self manual lymph drainage   Status Achieved             Long Term Clinic Goals - 01/16/16 1156      [place] Term Goal  #1   Title Patient will be knowledgeable about obtaining and wearing compression garments   Baseline She is independent with wear of her Solaris Ready Wrap, but will need to be measured for a compression sleeve as she hopefully reduces further with wear of this-01/02/16; issued info to get measured for her compression sleeve/glove at [facility] for class II-01/09/16   Status Partially Met     CC Long Term Goal  #2   Title Decrease circumference measurement at 10 cm proximal to olecranon to 34.5 cm or  less   Baseline 36.9 at eval; 36.3 12/17/15; 36.5 cm (but pt had good reductions at forearm) 12/26/15; 35.4 cm 01/02/16; 35.2 cm 01/09/16; 35.1 cm 01/15/16   Status On-going     CC Long Term Goal  #3   Title Patient will be able to don/doff compression garments independently   Baseline Independent with Solaris garment 12/26/15   Status Partially Met     CC Long Term Goal  #4   Title Reduce Lymphedema Life Impact score to 15% or less.    Status On-going          Patient will benefit from skilled therapeutic intervention in order to improve the following deficits and impairments:  Impaired UE functional use, Increased edema, Pain  Visit Diagnosis: Postmastectomy lymphedema  Right shoulder pain, unspecified chronicity  Chronic right shoulder pain     Problem List Patient Active Problem List   Diagnosis Date Noted  . Ductal carcinoma in situ (DCIS) of right breast 11/26/2015  . Lymphedema of arm 11/26/2015  . Lymphedema 11/01/2015  . Leg pain, bilateral 12/28/2013  . Special screening for malignant neoplasms, colon 08/09/2013  . Renal artery stenosis, native, bilateral ([place]) 06/09/2013  .  Diabetes mellitus with renal complications ([place]) 63/03/6008  . RASH AND OTHER NONSPECIFIC SKIN ERUPTION 07/05/2008  . Malignant neoplasm of female breast ([place]) 07/04/2008  . HYPERCHOLESTEROLEMIA 07/04/2008  . Essential hypertension 07/04/2008  . GERD 07/04/2008  . DUODENITIS, WITHOUT HEMORRHAGE 07/04/2008  . HIATAL HERNIA 07/04/2008  . Arthropathy 07/04/2008    Otelia Limes, PTA 01/20/2016, 9:44 AM  [place], [place], [address] Phone: [phone]   Fax:  [phone]  Name: Donna May MRN: [id number] Date of Birth: 10/04/1940   Serafina Royals, PT 01/20/16 9:48 AM

## 2016-01-20 ENCOUNTER — Ambulatory Visit: Payer: Medicare Other

## 2016-01-20 DIAGNOSIS — M25511 Pain in right shoulder: Secondary | ICD-10-CM

## 2016-01-20 DIAGNOSIS — I972 Postmastectomy lymphedema syndrome: Secondary | ICD-10-CM

## 2016-01-20 DIAGNOSIS — G8929 Other chronic pain: Secondary | ICD-10-CM | POA: Diagnosis not present

## 2016-01-20 NOTE — Therapy (Signed)
[name], [place], [address] Phone: [phone]   Fax:  [phone]  Physical Therapy Treatment  Patient Details  Name: Donna May MRN: [id number] Date of Birth: 1940/07/16 Referring Provider: Lurline Del, MD  Encounter Date: 01/20/2016      PT End of Session - 01/20/16 1103    Visit Number 11   Number of Visits 19   Date for PT Re-Evaluation 01/17/16   PT Start Time 1019   PT Stop Time 1103   PT Time Calculation (min) 44 min   Activity Tolerance Patient tolerated treatment well   Behavior During Therapy [facility] for tasks assessed/performed      Past Medical History:  Diagnosis Date  . Anemia   . Arthritis   . Breast cancer ([place]) 1992  . Diabetes mellitus    type 2  . Duodenitis 01/18/2002  . Fainting spell   . GERD (gastroesophageal reflux disease)   . Heart murmur   . Hiatal hernia 08/08/2008, 01/18/2002  . History of pneumonia   . Hyperlipidemia   . Hypertension   . UTI (lower urinary tract infection)     Past Surgical History:  Procedure Laterality Date  . AXILLARY SURGERY     cyst removal, right  . MASTECTOMY  1992   right, with flap  . NM MYOCAR PERF WALL MOTION  06/11/2009   Protocol:Bruce, post stress EF58%, EKG negative for ischemia, low risk  . TONSILLECTOMY    . TRANSTHORACIC ECHOCARDIOGRAM  12/24/2009   LVEF =>55%, normal study    There were no vitals filed for this visit.      Subjective Assessment - 01/20/16 1021    Subjective Nothing new, wearing the SolarisWrap with no problems.    Pertinent History Patient with a history of right mastectomy and axillary lymph node dissection in March of 1992, patient states she thinks about 5 lymph nodes were removed. She noticed swelling in her right UE at the beginning of August 2017.   Patient Stated Goals to get the arm back down   Currently in Pain? No/denies               LYMPHEDEMA/ONCOLOGY QUESTIONNAIRE  - 01/20/16 1023      Right Upper Extremity Lymphedema   15 cm Proximal to Olecranon Process 34 cm   10 cm Proximal to Olecranon Process 35.2 cm   Olecranon Process 29.5 cm   15 cm Proximal to Ulnar Styloid Process 27.3 cm   10 cm Proximal to Ulnar Styloid Process 24.8 cm   Just Proximal to Ulnar Styloid Process 18.5 cm   Across Hand at [employer] 20.4 cm   At [place] of 2nd Digit 6.5 cm                  [facility] Adult PT Treatment/Exercise - 01/20/16 0001      Manual Therapy   Manual therapy comments Circumference measurements taken   Manual Lymphatic Drainage (MLD) In supine, short neck, 5 diaphragmatic breaths, right inguinal nodes with right axillo-inguinal anastamosis, left axillary nodes with anterior interaxillary anastamosis, right shoulder upper arm, forearm and hand with return along pathways instructing pt throughout and having her practice a few postitions performed by Saverio Danker, SPT directly observed by Collie Siad, PTA.                    Short Term Clinic Goals - 01/20/16 1206      CC Short Term Goal  #  1   Title Patient will be independent in self manual lymph drainage   Status Achieved     CC Short Term Goal  #2   Title Decrease circumference measurement across hand at thumb webspace to 19.5 cm or less   Baseline 20.2 cm at eval; 20.4 cm 12/12/15 (bandages off since 12/10/15); 19.7 cm 12/17/15; 19 cm 12/26/15; 20.4 cm 2016-02-14   Status Partially Met     CC Short Term Goal  #3   Title Patient will be knowledgeable in lymphedema risk reduction practices.   Status Achieved             Long Term Clinic Goals - Feb 14, 2016 1204      CC Long Term Goal  #1   Title Patient will be knowledgeable about obtaining and wearing compression garments   Baseline She is independent with wear of her Solaris Ready Wrap, but will need to be measured for a compression sleeve as she hopefully reduces further with wear of this-01/02/16; issued info to  get measured for her compression sleeve/glove at [facility] for class II-01/09/16; pt plans to get measured in next few days for compression sleeve/glove-February 14, 2016   Status Achieved     CC Long Term Goal  #2   Title Decrease circumference measurement at 10 cm proximal to olecranon to 34.5 cm or less   Baseline 36.9 at eval; 36.3 12/17/15; 36.5 cm (but pt had good reductions at forearm) 12/26/15; 35.4 cm 01/02/16; 35.2 cm 01/09/16; 35.1 cm 01/15/16; 35.2 cm 2016/02/14   Status On-going     CC Long Term Goal  #3   Title Patient will be able to don/doff compression garments independently   Baseline Independent with Solaris garment 12/26/15; getting measured this week for compression sleeve/glove-2016/02/14   Status Partially Met     CC Long Term Goal  #4   Title Reduce Lymphedema Life Impact score to 15% or less.    Baseline 29% at eval; 19% - 2016/02/14   Status On-going            Plan - 2016/02/14 1103    Clinical Impression Statement Pts forearm reduced nicelt this week from last time measured. She plans to try to get measured for her compression sleeve/glove if not today in next 1-2 days while she is reduced as well as she is. She did well with review of self manual lymph drainage only requiring minor VC to not slide at end of stretch which she was doing minimally.    Rehab Potential Good   Clinical Impairments Affecting Rehab Potential history of right axillary lymph node dissection (in 1992)   PT Frequency 2x / week   PT Duration 6 weeks   PT Treatment/Interventions ADLs/Self Care Home Management;Therapeutic exercise;DME Instruction;Patient/family education;Manual techniques;Manual lymph drainage;Compression bandaging;Passive range of motion;Taping   PT Next Visit Plan Pt to be on hold after todays visit until she receives her compression sleeve/glove.   Consulted and Agree with Plan of Care Patient      Patient will benefit from skilled therapeutic intervention in order to improve the  following deficits and impairments:  Impaired UE functional use, Increased edema, Pain  Visit Diagnosis: Postmastectomy lymphedema  Right shoulder pain, unspecified chronicity       G-Codes - 2016-02-14 0947    Functional Assessment Tool Used clinical judgement   Functional Limitation Self care   Self Care Current Status (Q5956) At least 1 percent but less than 20 percent impaired, limited or restricted   Self Care  Goal Status (Y8185) At least 1 percent but less than 20 percent impaired, limited or restricted      Problem List Patient Active Problem List   Diagnosis Date Noted  . Ductal carcinoma in situ (DCIS) of right breast 11/26/2015  . Lymphedema of arm 11/26/2015  . Lymphedema 11/01/2015  . Leg pain, bilateral 12/28/2013  . Special screening for malignant neoplasms, colon 08/09/2013  . Renal artery stenosis, native, bilateral ([place]) 06/09/2013  . Diabetes mellitus with renal complications ([place]) 90/93/1121  . RASH AND OTHER NONSPECIFIC SKIN ERUPTION 07/05/2008  . Malignant neoplasm of female breast ([place]) 07/04/2008  . HYPERCHOLESTEROLEMIA 07/04/2008  . Essential hypertension 07/04/2008  . GERD 07/04/2008  . DUODENITIS, WITHOUT HEMORRHAGE 07/04/2008  . HIATAL HERNIA 07/04/2008  . Arthropathy 07/04/2008    Otelia Limes, PTA 01/20/2016, 12:10 PM  [place], [place], [address] Phone: [phone]   Fax:  [phone]  Name: CANA MIGNANO MRN: [id number] Date of Birth: April 28, 1940

## 2016-01-23 ENCOUNTER — Encounter: Payer: Medicare Other | Admitting: Physical Therapy

## 2016-01-27 DIAGNOSIS — E119 Type 2 diabetes mellitus without complications: Secondary | ICD-10-CM | POA: Diagnosis not present

## 2016-01-27 DIAGNOSIS — E7800 Pure hypercholesterolemia, unspecified: Secondary | ICD-10-CM | POA: Diagnosis not present

## 2016-01-27 LAB — HEPATIC FUNCTION PANEL
ALT: 12 U/L (ref 7–35)
AST: 16 U/L (ref 13–35)
Alkaline Phosphatase: 53 U/L (ref 25–125)
BILIRUBIN, TOTAL: 0.3 mg/dL

## 2016-01-27 LAB — BASIC METABOLIC PANEL
BUN: 16 mg/dL (ref 4–21)
CREATININE: 1.1 mg/dL (ref 0.5–1.1)
GLUCOSE: 89 mg/dL
POTASSIUM: 4 mmol/L (ref 3.4–5.3)
SODIUM: 142 mmol/L (ref 137–147)

## 2016-01-27 LAB — HEMOGLOBIN A1C: HEMOGLOBIN A1C: 6.6

## 2016-01-27 LAB — LIPID PANEL
Cholesterol: 179 mg/dL (ref 0–200)
HDL: 60 mg/dL (ref 35–70)
LDL Cholesterol: 110 mg/dL
LDl/HDL Ratio: 1.8
Triglycerides: 46 mg/dL (ref 40–160)

## 2016-01-29 ENCOUNTER — Ambulatory Visit: Payer: Medicare Other

## 2016-01-29 DIAGNOSIS — I972 Postmastectomy lymphedema syndrome: Secondary | ICD-10-CM

## 2016-01-29 DIAGNOSIS — G8929 Other chronic pain: Secondary | ICD-10-CM

## 2016-01-29 DIAGNOSIS — M25511 Pain in right shoulder: Secondary | ICD-10-CM

## 2016-01-29 NOTE — Therapy (Signed)
[place], [place], [address] Phone: [phone]   Fax:  [phone]  Physical Therapy Treatment  Patient Details  Name: Donna May MRN: [id number] Date of Birth: [birth year] Referring Provider: Lurline Del, MD  Encounter Date: 01/29/2016      PT End of Session - 01/29/16 1102    Visit Number 12   Number of Visits 19   Date for PT Re-Evaluation 02/17/16   PT Start Time 1020   PT Stop Time 1103   PT Time Calculation (min) 43 min   Activity Tolerance Patient tolerated treatment well   Behavior During Therapy [facility] for tasks assessed/performed      Past Medical History:  Diagnosis Date  . Anemia   . Arthritis   . Breast cancer ([place]) 1992  . Diabetes mellitus    type 2  . Duodenitis 01/18/2002  . Fainting spell   . GERD (gastroesophageal reflux disease)   . Heart murmur   . Hiatal hernia 08/08/2008, 01/18/2002  . History of pneumonia   . Hyperlipidemia   . Hypertension   . UTI (lower urinary tract infection)     Past Surgical History:  Procedure Laterality Date  . AXILLARY SURGERY     cyst removal, right  . MASTECTOMY  1992   right, with flap  . NM MYOCAR PERF WALL MOTION  06/11/2009   Protocol:Bruce, post stress EF58%, EKG negative for ischemia, low risk  . TONSILLECTOMY    . TRANSTHORACIC ECHOCARDIOGRAM  12/24/2009   LVEF =>55%, normal study    There were no vitals filed for this visit.      Subjective Assessment - 01/29/16 1027    Subjective I haven't got measured for my compression sleeve yet as I've just been too busy but I wanted to come anyways so you can measure my arm and make sure it still looks good.    Pertinent History Patient with a history of right mastectomy and axillary lymph node dissection in March of 1992, patient states she thinks about 5 lymph nodes were removed. She noticed swelling in her right UE at the beginning of August 2017.   Patient Stated Goals  to get the arm back down   Currently in Pain? No/denies               LYMPHEDEMA/ONCOLOGY QUESTIONNAIRE - 01/29/16 1028      Right Upper Extremity Lymphedema   15 cm Proximal to Olecranon Process 34.2 cm   10 cm Proximal to Olecranon Process 34.9 cm   Olecranon Process 29.5 cm   15 cm Proximal to Ulnar Styloid Process 28.7 cm   10 cm Proximal to Ulnar Styloid Process 25.2 cm   Just Proximal to Ulnar Styloid Process 19.3 cm   Across Hand at [employer] 19 cm   At [place] of 2nd Digit 6.6 cm                  [facility] Adult PT Treatment/Exercise - 01/29/16 0001      Manual Therapy   Manual therapy comments Circumference measurements taken   Manual Lymphatic Drainage (MLD) In supine, short neck, 5 diaphragmatic breaths, right inguinal nodes with right axillo-inguinal anastamosis, left axillary nodes with anterior interaxillary anastamosis, right shoulder upper arm, forearm and hand with return along pathways instructing pt throughout and having her practice a few postitions performed by Saverio Danker, SPT directly observed by Collie Siad, PTA.  Short Term Clinic Goals - 01/20/16 1206      CC Short Term Goal  #1   Title Patient will be independent in self manual lymph drainage   Status Achieved     CC Short Term Goal  #2   Title Decrease circumference measurement across hand at thumb webspace to 19.5 cm or less   Baseline 20.2 cm at eval; 20.4 cm 12/12/15 (bandages off since 12/10/15); 19.7 cm 12/17/15; 19 cm 12/26/15; 20.4 cm 01/20/16   Status Partially Met     CC Short Term Goal  #3   Title Patient will be knowledgeable in lymphedema risk reduction practices.   Status Achieved             Long Term Clinic Goals - 01/29/16 1210      CC Long Term Goal  #1   Title Patient will be knowledgeable about obtaining and wearing compression garments   Baseline She is independent with wear of her Solaris Ready Wrap, but will need  to be measured for a compression sleeve as she hopefully reduces further with wear of this-01/02/16; issued info to get measured for her compression sleeve/glove at [facility] for class II-01/09/16; pt plans to get measured in next few days for compression sleeve/glove-01/20/16   Status Achieved     CC Long Term Goal  #2   Title Decrease circumference measurement at 10 cm proximal to olecranon to 34.5 cm or less   Baseline 36.9 at eval; 36.3 12/17/15; 36.5 cm (but pt had good reductions at forearm) 12/26/15; 35.4 cm 01/02/16; 35.2 cm 01/09/16; 35.1 cm 01/15/16; 35.2 cm 01/20/16; 34.9 cm 01/29/16   Status On-going     CC Long Term Goal  #3   Title Patient will be able to don/doff compression garments independently   Baseline Independent with Solaris garment 12/26/15; getting measured this week for compression sleeve/glove-01/20/16   Status Partially Met     CC Long Term Goal  #4   Title Reduce Lymphedema Life Impact score to 15% or less.    Baseline 29% at eval; 19% - 01/20/16   Status On-going            Plan - 01/29/16 1208    Clinical Impression Statement Pts circumference measurements were overall reduced except her forearm was slightly increased from last time measured, but not higher than it's been in recent weeks. She wasn't able to get measured for a compression sleeve/glove yet as she reports hadn't had time but planned to go to [place] today after session to see if they could fit her then, if not at least to make an appt. She plans to have one more visit after receiving garments so we can assess fit.    Rehab Potential Good   Clinical Impairments Affecting Rehab Potential history of right axillary lymph node dissection (in 1992)   PT Frequency 2x / week   PT Duration 6 weeks   PT Treatment/Interventions ADLs/Self Care Home Management;Therapeutic exercise;DME Instruction;Patient/family education;Manual techniques;Manual lymph drainage;Compression bandaging;Passive range  of motion;Taping   PT Next Visit Plan Pt to be on hold after todays visit until she receives her compression sleeve/glove. Have pt retake LLIS and probable D/C next visit.    PT Home Exercise Plan Cont to wear her Solaris ReadyWrap and gauntlet and daily self MLD   Consulted and Agree with Plan of Care Patient      Patient will benefit from skilled therapeutic intervention in order to improve the following deficits and  impairments:  Impaired UE functional use, Increased edema, Pain  Visit Diagnosis: Postmastectomy lymphedema  Right shoulder pain, unspecified chronicity  Chronic right shoulder pain     Problem List Patient Active Problem List   Diagnosis Date Noted  . Ductal carcinoma in situ (DCIS) of right breast 11/26/2015  . Lymphedema of arm 11/26/2015  . Lymphedema 11/01/2015  . Leg pain, bilateral 12/28/2013  . Special screening for malignant neoplasms, colon 08/09/2013  . Renal artery stenosis, native, bilateral ([place]) 06/09/2013  . Diabetes mellitus with renal complications ([place]) 92/42/6834  . RASH AND OTHER NONSPECIFIC SKIN ERUPTION 07/05/2008  . Malignant neoplasm of female breast ([place]) 07/04/2008  . HYPERCHOLESTEROLEMIA 07/04/2008  . Essential hypertension 07/04/2008  . GERD 07/04/2008  . DUODENITIS, WITHOUT HEMORRHAGE 07/04/2008  . HIATAL HERNIA 07/04/2008  . Arthropathy 07/04/2008    Otelia Limes, PTA 01/29/2016, 12:13 PM  [place], [place], [address] Phone: [phone]   Fax:  [phone]  Name: LORAINE BHULLAR MRN: [id number] Date of Birth: May 20, 1940

## 2016-02-03 ENCOUNTER — Other Ambulatory Visit: Payer: Self-pay | Admitting: Cardiovascular Disease

## 2016-02-03 ENCOUNTER — Ambulatory Visit: Payer: Medicare Other

## 2016-02-03 ENCOUNTER — Other Ambulatory Visit: Payer: Self-pay | Admitting: Gastroenterology

## 2016-02-03 DIAGNOSIS — G8929 Other chronic pain: Secondary | ICD-10-CM | POA: Diagnosis not present

## 2016-02-03 DIAGNOSIS — I972 Postmastectomy lymphedema syndrome: Secondary | ICD-10-CM | POA: Diagnosis not present

## 2016-02-03 DIAGNOSIS — M25511 Pain in right shoulder: Secondary | ICD-10-CM | POA: Diagnosis not present

## 2016-02-03 NOTE — Therapy (Signed)
[place], [place], [address] Phone: [phone]   Fax:  [phone]  Physical Therapy Treatment  Patient Details  Name: Donna May MRN: [id number] Date of Birth: 01/29/1941 Referring Provider: Lurline Del, MD  Encounter Date: 02/03/2016      PT End of Session - 02/03/16 1030    Visit Number 13   Number of Visits 19   Date for PT Re-Evaluation 02/17/16   PT Start Time 1005   PT Stop Time 1100   PT Time Calculation (min) 55 min   Activity Tolerance Patient tolerated treatment well   Behavior During Therapy [facility] for tasks assessed/performed      Past Medical History:  Diagnosis Date  . Anemia   . Arthritis   . Breast cancer ([place]) 1992  . Diabetes mellitus    type 2  . Duodenitis 01/18/2002  . Fainting spell   . GERD (gastroesophageal reflux disease)   . Heart murmur   . Hiatal hernia 08/08/2008, 01/18/2002  . History of pneumonia   . Hyperlipidemia   . Hypertension   . UTI (lower urinary tract infection)     Past Surgical History:  Procedure Laterality Date  . AXILLARY SURGERY     cyst removal, right  . MASTECTOMY  1992   right, with flap  . NM MYOCAR PERF WALL MOTION  06/11/2009   Protocol:Bruce, post stress EF58%, EKG negative for ischemia, low risk  . TONSILLECTOMY    . TRANSTHORACIC ECHOCARDIOGRAM  12/24/2009   LVEF =>55%, normal study    There were no vitals filed for this visit.      Subjective Assessment - 02/03/16 1019    Subjective I went and got my compression sleeve after my last appt and it feels good. I've been wearing it.    Pertinent History Patient with a history of right mastectomy and axillary lymph node dissection in March of 1992, patient states she thinks about 5 lymph nodes were removed. She noticed swelling in her right UE at the beginning of August 2017.   Patient Stated Goals to get the arm back down   Currently in Pain? No/denies                LYMPHEDEMA/ONCOLOGY QUESTIONNAIRE - 02/03/16 1020      Right Upper Extremity Lymphedema   15 cm Proximal to Olecranon Process 34.4 cm   10 cm Proximal to Olecranon Process 35.8 cm   Olecranon Process 29.1 cm   15 cm Proximal to Ulnar Styloid Process 29.4 cm   10 cm Proximal to Ulnar Styloid Process 25.4 cm   Just Proximal to Ulnar Styloid Process 17.9 cm   Across Hand at [employer] 18.8 cm   At [place] of 2nd Digit 6.5 cm                  [facility] Adult PT Treatment/Exercise - 02/03/16 0001      Self-Care   Self-Care Other Self-Care Comments   Other Self-Care Comments  Spent time explaining to pt importance of getting Class II sleeve and also spoke with supervisor and she left message at [facility] that pt will be returning for another sleeve with correct amount of compression.     Manual Therapy   Manual therapy comments Circumference measurements taken   Manual Lymphatic Drainage (MLD) In supine, short neck, 5 diaphragmatic breaths, right inguinal nodes with right axillo-inguinal anastamosis, left axillary nodes with anterior interaxillary anastamosis, right shoulder  upper arm, forearm and hand with return along pathways instructing pt throughout and having her practice a few postitions performed by Saverio Danker, SPT directly observed by Collie Siad, PTA.                    Short Term Clinic Goals - 01/20/16 1206      CC Short Term Goal  #1   Title Patient will be independent in self manual lymph drainage   Status Achieved     CC Short Term Goal  #2   Title Decrease circumference measurement across hand at thumb webspace to 19.5 cm or less   Baseline 20.2 cm at eval; 20.4 cm 12/12/15 (bandages off since 12/10/15); 19.7 cm 12/17/15; 19 cm 12/26/15; 20.4 cm 01/20/16   Status Partially Met     CC Short Term Goal  #3   Title Patient will be knowledgeable in lymphedema risk reduction practices.   Status Achieved              Long Term Clinic Goals - 02/03/16 1104      CC Long Term Goal  #1   Title Patient will be knowledgeable about obtaining and wearing compression garments   Baseline She is independent with wear of her Solaris Ready Wrap, but will need to be measured for a compression sleeve as she hopefully reduces further with wear of this-01/02/16; issued info to get measured for her compression sleeve/glove at [facility] for class II-01/09/16; pt plans to get measured in next few days for compression sleeve/glove-01/20/16   Status Achieved     CC Long Term Goal  #2   Title Decrease circumference measurement at 10 cm proximal to olecranon to 34.5 cm or less   Baseline 36.9 at eval; 36.3 12/17/15; 36.5 cm (but pt had good reductions at forearm) 12/26/15; 35.4 cm 01/02/16; 35.2 cm 01/09/16; 35.1 cm 01/15/16; 35.2 cm 01/20/16; 34.9 cm 01/29/16; 35.8 cm, pt had increases today as she was fitted in a Class I garment, not class II. Pt will return to store to rectify this and we left them a message-02/03/16   Status On-going     CC Long Term Goal  #3   Title Patient will be able to don/doff compression garments independently   Baseline Independent with Solaris garment 12/26/15; getting measured this week for compression sleeve/glove-01/20/16; pt able to donn/doff new compression garment-02/03/16   Status Achieved     CC Long Term Goal  #4   Title Reduce Lymphedema Life Impact score to 15% or less.    Baseline 29% at eval; 19% - 01/20/16; 46% score today but this is inconsistent with pts reports of progress with her lymphedema and just 2 weeks ago she scored 19%. Overall from pts circumference measurements (up until last week) and her reports therapist feels the 19% score is more accurate-02/03/16   Status Not Met            Plan - 02/03/16 1102    Clinical Impression Statement Pt came in wearing her new compression sleeve but it was a Class I though we specified Class II on paper issued to pt. So Leone Payor, PT called and left a message at [facility] that pt would be returning for correct garment. Other than that it was a good fit so if they give her same garment but in class II it will be a good fit for pt. She has met most other goals and is ready for D/C at  this time.    Rehab Potential Good   Clinical Impairments Affecting Rehab Potential history of right axillary lymph node dissection (in 1992)   PT Frequency 2x / week   PT Duration 6 weeks   PT Treatment/Interventions ADLs/Self Care Home Management;Therapeutic exercise;DME Instruction;Patient/family education;Manual techniques;Manual lymph drainage;Compression bandaging;Passive range of motion;Taping   PT Next Visit Plan D/C this visit.    Recommended Other Services Faxed order to Dr. Jana Hakim for Class II garment and will call pt when this returns.    Consulted and Agree with Plan of Care Patient      Patient will benefit from skilled therapeutic intervention in order to improve the following deficits and impairments:  Impaired UE functional use, Increased edema, Pain  Visit Diagnosis: Postmastectomy lymphedema     Problem List Patient Active Problem List   Diagnosis Date Noted  . Ductal carcinoma in situ (DCIS) of right breast 11/26/2015  . Lymphedema of arm 11/26/2015  . Lymphedema 11/01/2015  . Leg pain, bilateral 12/28/2013  . Special screening for malignant neoplasms, colon 08/09/2013  . Renal artery stenosis, native, bilateral ([place]) 06/09/2013  . Diabetes mellitus with renal complications ([place]) 03/52/4818  . RASH AND OTHER NONSPECIFIC SKIN ERUPTION 07/05/2008  . Malignant neoplasm of female breast ([place]) 07/04/2008  . HYPERCHOLESTEROLEMIA 07/04/2008  . Essential hypertension 07/04/2008  . GERD 07/04/2008  . DUODENITIS, WITHOUT HEMORRHAGE 07/04/2008  . HIATAL HERNIA 07/04/2008  . Arthropathy 07/04/2008    Otelia Limes, PTA 02/03/2016, 11:29 AM  [place], [place], [address] Phone: [phone]   Fax:  [phone]  Name: KEALEY KEMMER MRN: [id number] Date of Birth: [birth year]  PHYSICAL THERAPY DISCHARGE SUMMARY  Visits from Start of Care: 13  Current functional level related to goals / functional outcomes: Pt has made progress in therapy and her swelling has improved. Pt is now independent in self drainage and has a compression sleeve. Her compression sleeve is not the correct compression class and does not provide enough compression so DME supplier is getting her the correct sleeve.    Remaining deficits: See above   Education / Equipment: Self drainage, compression garments Plan: Patient agrees to discharge.  Patient goals were partially met. Patient is being discharged due to meeting the stated rehab goals.  ?????    Allyson Sabal, PT 02/03/16 11:35 AM

## 2016-02-03 NOTE — Telephone Encounter (Signed)
Please advise on refills requsted for prevacid 30 mg one a day. Last seen 04-2015

## 2016-02-03 NOTE — Telephone Encounter (Signed)
Okay to refill.  Thanks.

## 2016-02-05 DIAGNOSIS — E119 Type 2 diabetes mellitus without complications: Secondary | ICD-10-CM | POA: Diagnosis not present

## 2016-02-05 DIAGNOSIS — E559 Vitamin D deficiency, unspecified: Secondary | ICD-10-CM | POA: Diagnosis not present

## 2016-02-05 DIAGNOSIS — E7800 Pure hypercholesterolemia, unspecified: Secondary | ICD-10-CM | POA: Diagnosis not present

## 2016-02-05 DIAGNOSIS — M8589 Other specified disorders of bone density and structure, multiple sites: Secondary | ICD-10-CM | POA: Diagnosis not present

## 2016-02-05 DIAGNOSIS — I10 Essential (primary) hypertension: Secondary | ICD-10-CM | POA: Diagnosis not present

## 2016-02-10 ENCOUNTER — Ambulatory Visit: Payer: Medicare Other | Admitting: Physical Therapy

## 2016-02-13 ENCOUNTER — Ambulatory Visit: Payer: Medicare Other

## 2016-02-20 ENCOUNTER — Encounter: Payer: Self-pay | Admitting: Internal Medicine

## 2016-02-27 ENCOUNTER — Encounter: Payer: Medicare Other | Admitting: Physical Therapy

## 2016-03-05 ENCOUNTER — Encounter: Payer: Medicare Other | Admitting: Physical Therapy

## 2016-03-17 DIAGNOSIS — Z961 Presence of intraocular lens: Secondary | ICD-10-CM | POA: Diagnosis not present

## 2016-03-17 DIAGNOSIS — H20013 Primary iridocyclitis, bilateral: Secondary | ICD-10-CM | POA: Diagnosis not present

## 2016-03-17 LAB — HM DIABETES EYE EXAM

## 2016-04-02 ENCOUNTER — Telehealth: Payer: Self-pay

## 2016-04-02 NOTE — Telephone Encounter (Signed)
Call to ms Jeneen Rinks. Explained why Donna May is doing AWV Stated tomorrow may not be a good day and she works as well, so rescheduled for next Friday the 26th at 11am  Will call if she changes her mind and the apt is still open

## 2016-04-03 ENCOUNTER — Ambulatory Visit: Payer: Medicare Other

## 2016-04-06 ENCOUNTER — Ambulatory Visit: Payer: Medicare Other | Admitting: Internal Medicine

## 2016-04-10 ENCOUNTER — Ambulatory Visit (INDEPENDENT_AMBULATORY_CARE_PROVIDER_SITE_OTHER): Payer: Medicare Other

## 2016-04-10 VITALS — BP 174/80 | HR 67 | Ht 65.5 in | Wt 174.0 lb

## 2016-04-10 DIAGNOSIS — Z0000 Encounter for general adult medical examination without abnormal findings: Secondary | ICD-10-CM | POA: Diagnosis not present

## 2016-04-10 NOTE — Progress Notes (Addendum)
Subjective:   Donna May is a 76 y.o. female who presents for Medicare Annual (Subsequent) preventive examination.  The Patient was informed that the wellness visit is to identify future health risk and educate and initiate measures that can reduce risk for increased disease through the lifespan.    NO ROS; Medicare Wellness Visit   Had lymphedema rehab ; difficult to complete the massage recommended as she lives alone.  remote history of right breast cancer 1992 with mastectomy and right axillary lymph node dissection/ recent episode of lymphedema Can't wrap her arm; bought sleeve for lymphedema   Educated on the lymphedema pump and may read and discuss with MD  Having some issues with right shoulder  Going to review for ultrasound in FEB  BP 174 70 C/o of cramps in legs and hands  To see Dr. Raliegh Ip  Next week to review her issues    Describes health as good, fair or great? 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 No; just more OA  Safety at home and  Community reviewed;   Hearing Screening   125Hz  250Hz  500Hz  1000Hz  2000Hz  3000Hz  4000Hz  6000Hz  8000Hz   Right ear:       100    Left ear:       100    Vision Screening Comments: Checked eyes last May as she had cataract Still having some issues with left eye Uritis - light sensitive Dr. Katy Fitch [place]     Depression Screen PhQ 2: negative  Activities of Daily Living - See functional screen   Cognitive testing; Ad8 score; 0 or less than 2  MMSE deferred or completed if AD8 + 2 issues  Advanced Directives reviewed for completion; discussion with MD as well as supportive resources as needed  Patient care team reviewed   Preventives screens reviewed Colonoscopy  Aged out   Immunization History  Administered Date(s) Administered  . Influenza, High Dose Seasonal PF 01/12/2014, 02/04/2015  . Influenza,inj,Quad PF,36+ Mos 12/27/2015  . Influenza-Unspecified 12/14/2012  . Pneumococcal Conjugate-13 01/12/2014  . Pneumococcal Polysaccharide-23 02/04/2015  . Zoster 05/02/2012   Required Immunizations needed today  Screening test up to date or reviewed for plan of completion Health Maintenance Due  Topic Date Due  . TETANUS/TDAP  01/31/1958  . FOOT EXAM  09/06/2015  . MAMMOGRAM  10/22/2015  . OPHTHALMOLOGY EXAM  12/31/2015  [name] will call Dr. Katy Fitch for eye exam   Mammogram in 10/2015;  Was going [facility] but went to [facility] last year Plans to go back to [facility]  C/o of pains in right breast; checked 10/2015;  May want  Breast check at Dr. Marthann Schiller visit; states Dr. Jana Hakim aware GYN; Dr. Dellis Filbert, Lelan Pons; she has told them she has pain on occasion  Eye exam by Dr. Katy Fitch  Tetanus needed and educated;  Will defer due to cost but will come in and get one if she is injured        Objective:     Vitals: BP (!) 174/80   Pulse 67   Ht 5' 5.5" (1.664 m)   Wt 174 lb (78.9 kg)   SpO2 95%    BMI 28.51 kg/m   Body mass index is 28.51 kg/m.   Tobacco History  Smoking Status  . Former Smoker  . Packs/day: 0.01  . Years: 10.00  Smokeless Tobacco  . Never Used    Comment: quit 76 yo;      Counseling given: Yes   Past Medical History:  Diagnosis Date  . Anemia   . Arthritis   . Breast cancer ([place]) 1992  . Diabetes mellitus    type 2  . Duodenitis 01/18/2002  . Fainting spell   . GERD (gastroesophageal reflux disease)   . Heart murmur   . Hiatal hernia 08/08/2008, 01/18/2002  . History of pneumonia   . Hyperlipidemia   . Hypertension   . UTI (lower urinary tract infection)    Past Surgical History:  Procedure Laterality Date  . AXILLARY SURGERY     cyst removal, right  . MASTECTOMY  1992   right, with flap  . NM MYOCAR PERF WALL MOTION  06/11/2009   Protocol:Bruce, post stress EF58%, EKG negative for ischemia, low risk  . TONSILLECTOMY    . TRANSTHORACIC ECHOCARDIOGRAM  12/24/2009   LVEF =>55%, normal study   Family History  Problem Relation Age of Onset  . Breast cancer Cousin   . Diabetes Brother   . Heart disease Mother   . Hypertension Mother   . Heart disease Father   . Hypertension Father   . Prostate cancer Brother   . Heart attack Maternal Grandmother   . Stroke Maternal Grandfather    History  Sexual Activity  . Sexual activity: Not on file    Outpatient Encounter Prescriptions as of 04/10/2016  Medication Sig  . albuterol (PROVENTIL HFA;VENTOLIN HFA) 108 (90 Base) MCG/ACT inhaler Inhale 1-2 puffs into the lungs every 6 (six) hours as needed for wheezing or shortness of breath.  Marland Kitchen amLODipine (NORVASC) 10 MG tablet TAKE 1 TABLET (10 MG TOTAL) BY MOUTH DAILY. NEED OFFICE VISIT  . aspirin 81 MG tablet Take 81 mg by mouth daily.  Marland Kitchen CALCIUM-MAGNESIUM-VITAMIN D PO Take 1 tablet by mouth daily.  . Cholecalciferol (VITAMIN D3) 2000 UNITS TABS Take 1 tablet by mouth daily.  . CRESTOR 20 MG tablet Take 1 tablet (20 mg total) by mouth daily.   . Echinacea 400 MG CAPS Take 1 capsule by mouth daily.  Marland Kitchen EVISTA 60 MG tablet Take 60 mg by mouth daily.   Marland Kitchen JANUVIA 100 MG tablet Take 100 mg by mouth daily.   . lansoprazole (PREVACID) 30 MG capsule TAKE 1 CAPSULE DAILY BEFOREBREAKFAST  . metFORMIN (GLUCOPHAGE-XR) 500 MG 24 hr tablet Take 500 mg by mouth 2 (two) times daily before a meal.   . metoprolol succinate (  TOPROL-XL) 100 MG 24 hr tablet Take 1 tablet (100 mg total) by mouth daily. Take with or immediately following a meal.  . Multiple Vitamins-Minerals (CENTRUM SILVER ULTRA WOMENS) TABS Take 1 tablet by mouth daily.  . Omega 3 1000 MG CAPS Take 1 capsule by mouth daily.  . rosuvastatin (CRESTOR) 20 MG tablet TAKE 1 TABLET DAILY PLEASE CALL OFFICE FOR FURTHER    REFILLS  . valsartan-hydrochlorothiazide (DIOVAN-HCT) 320-12.5 MG tablet TAKE 1 TABLET BY MOUTH DAILY. NEED OV.  Marland Kitchen AMBULATORY NON FORMULARY MEDICATION Place 0.0125 g rectally 2 (two) times daily. Medication Name: Nitroglycerin ointment .0092. Apply 2-3 times daily as needed. (Patient not taking: Reported on 04/10/2016)  . predniSONE (DELTASONE) 20 MG tablet Take 3 tablets (60 mg total) by mouth daily. (Patient not taking: Reported on 12/23/2015)   Facility-Administered Encounter Medications as of 04/10/2016  Medication  . triamcinolone acetonide (KENALOG) 10 MG/ML injection 10 mg    Activities of Daily Living No flowsheet data found.  Patient Care Team: Marletta Lor, MD as PCP - General (Internal Medicine) Chauncey Cruel, MD as Consulting Physician (Oncology) Sanda Klein, MD as Consulting Physician (Cardiology) [name], MD as Consulting Physician (Obstetrics and Gynecology)    Assessment:     Exercise Activities and Dietary recommendations    Goals    . patient          Take a trip; slow down work Can take a trip; bus trip; Sr Citizen trip [employer] and gets vacation trips planner        Fall Risk Fall Risk  09/06/2014  04/28/2013  Falls in the past year? No No   Depression Screen PHQ 2/9 Scores 09/06/2014 04/28/2013  PHQ - 2 Score 0 0     Cognitive Function normal at present Works and living alone with issues  Ad8 score is 0        Immunization History  Administered Date(s) Administered  . Influenza, High Dose Seasonal PF 01/12/2014, 02/04/2015  . Influenza,inj,Quad PF,36+ Mos 12/27/2015  . Influenza-Unspecified 12/14/2012  . Pneumococcal Conjugate-13 01/12/2014  . Pneumococcal Polysaccharide-23 02/04/2015  . Zoster 05/02/2012   Screening Tests Health Maintenance  Topic Date Due  . TETANUS/TDAP  01/31/1958  . FOOT EXAM  09/06/2015  . MAMMOGRAM  10/22/2015  . OPHTHALMOLOGY EXAM  12/31/2015  . HEMOGLOBIN A1C  07/26/2016  . INFLUENZA VACCINE  Completed  . DEXA SCAN  Completed  . ZOSTAVAX  Completed  . PNA vac Low Risk Adult  Completed      PLAN  1. To call [employer] to see where she can get her strips for glucometer  Call [employer] to get information on vendor participating in diabetes supplies for strips   2. Verbalized concern over right breast pain at times; States Oncologist and GYN aware of this but still would like Dr. Raliegh Ip to check (will also discuss getting lymphedema pump; The rehab specialist mentioned it but never followed up)  Mammogram deferred as she had one at [facility] last year but moving back to [facility] 2018  3. Educated on tetanus (tdap) and will defer for now due to cost She went to pharmacy and was told her insurance does not cover   4. Had to defer foot exam due to time   5. Will ask dr. Raliegh Ip about vit d check and dose  6. States she does not take her [place] and metformin every day; A1c is 6.6 in 01/2016; will recheck per Dr. Raliegh Ip  01/31  7. BP elevated and to fup with Dr. Raliegh Ip  Given information on sodium  8. [name] to call Dr. Katy Fitch office for last eye exam report  During the course of the visit the patient was educated and counseled about the following appropriate  screening and preventive services:   Vaccines to include Pneumoccal, Influenza, Hepatitis B, Td, Zostavax, HCV  Electrocardiogram  Cardiovascular Disease  Colorectal cancer screening completed   Bone density screening/ taking Evista  Diabetes screening A1c 6.6;   Glaucoma screening DX URETIS - Dr Katy Fitch treating   Mammography/ around 8/ 2018 due moving care back to [facility]   Nutrition counseling   Patient Instructions (the written plan) was given to the patient.   PRXYV,OPFYT, RN  04/10/2016  Results of the annual Medicare wellness visit reviewed and agree with findings  Nyoka Cowden

## 2016-04-10 NOTE — Patient Instructions (Addendum)
Donna May , Thank you for taking time to come for your Medicare Wellness Visit. I appreciate your ongoing commitment to your health goals. Please review the following plan we discussed and let me know if I can assist you in the future.   To call [employer] to see where she can get her strips for glucometer  Call [place] to get information on vendor participating in diabetes supplies   Will have left breast checked due to occasional pain  A Tetanus is recommended every 10 years. Medicare covers a tetanus if you have a cut or wound; otherwise, there may be a charge. If you had not had a tetanus with pertusses, known as the Tdap, you can take this anytime.   Will defer foot exam today due to time    These are the goals we discussed: Goals    . patient          Take a trip; slow down work Can take a trip; bus trip; Sr Citizen trip [employer] and gets vacation trips planner         This is a list of the screening recommended for you and due dates:  Health Maintenance  Topic Date Due  . Tetanus Vaccine  01/31/1960  . Complete foot exam   09/06/2015  . Mammogram  10/22/2015  . Eye exam for diabetics  12/31/2015  . Hemoglobin A1C  07/26/2016  . Flu Shot  Completed  . DEXA scan (bone density measurement)  Completed  . Shingles Vaccine  Completed  . Pneumonia vaccines  Completed        Fall Prevention in the Home Introduction Falls can cause injuries. They can happen to people of all ages. There are many things you can do to make your home safe and to help prevent falls. What can I do on the outside of my home?  Regularly fix the edges of walkways and driveways and fix any cracks.  Remove anything that might make you trip as you walk through a door, such as a raised step or threshold.  Trim any bushes or trees on the path to your home.  Use bright outdoor lighting.  Clear any walking paths of anything that might make someone trip, such as rocks or tools.  Regularly  check to see if handrails are loose or broken. Make sure that both sides of any steps have handrails.  Any raised decks and porches should have guardrails on the edges.  Have any leaves, snow, or ice cleared regularly.  Use sand or salt on walking paths during winter.  Clean up any spills in your garage right away. This includes oil or grease spills. What can I do in the bathroom?  Use night lights.  Install grab bars by the toilet and in the tub and shower. Do not use towel bars as grab bars.  Use non-skid mats or decals in the tub or shower.  If you need to sit down in the shower, use a plastic, non-slip stool.  Keep the floor dry. Clean up any water that spills on the floor as soon as it happens.  Remove soap buildup in the tub or shower regularly.  Attach bath mats securely with double-sided non-slip rug tape.  Do not have throw rugs and other things on the floor that can make you trip. What can I do in the bedroom?  Use night lights.  Make sure that you have a light by your bed that is easy to reach.  Do not use any sheets or blankets that are too big for your bed. They should not hang down onto the floor.  Have a firm chair that has side arms. You can use this for support while you get dressed.  Do not have throw rugs and other things on the floor that can make you trip. What can I do in the kitchen?  Clean up any spills right away.  Avoid walking on wet floors.  Keep items that you use a lot in easy-to-reach places.  If you need to reach something above you, use a strong step stool that has a grab bar.  Keep electrical cords out of the way.  Do not use floor polish or wax that makes floors slippery. If you must use wax, use non-skid floor wax.  Do not have throw rugs and other things on the floor that can make you trip. What can I do with my stairs?  Do not leave any items on the stairs.  Make sure that there are handrails on both sides of the stairs and  use them. Fix handrails that are broken or loose. Make sure that handrails are as long as the stairways.  Check any carpeting to make sure that it is firmly attached to the stairs. Fix any carpet that is loose or worn.  Avoid having throw rugs at the top or bottom of the stairs. If you do have throw rugs, attach them to the floor with carpet tape.  Make sure that you have a light switch at the top of the stairs and the bottom of the stairs. If you do not have them, ask someone to add them for you. What else can I do to help prevent falls?  Wear shoes that:  Do not have high heels.  Have rubber bottoms.  Are comfortable and fit you well.  Are closed at the toe. Do not wear sandals.  If you use a stepladder:  Make sure that it is fully opened. Do not climb a closed stepladder.  Make sure that both sides of the stepladder are locked into place.  Ask someone to hold it for you, if possible.  Clearly mark and make sure that you can see:  Any grab bars or handrails.  First and last steps.  Where the edge of each step is.  Use tools that help you move around (mobility aids) if they are needed. These include:  Canes.  Walkers.  Scooters.  Crutches.  Turn on the lights when you go into a dark area. Replace any light bulbs as soon as they burn out.  Set up your furniture so you have a clear path. Avoid moving your furniture around.  If any of your floors are uneven, fix them.  If there are any pets around you, be aware of where they are.  Review your medicines with your doctor. Some medicines can make you feel dizzy. This can increase your chance of falling. Ask your doctor what other things that you can do to help prevent falls. This information is not intended to replace advice given to you by your health care provider. Make sure you discuss any questions you have with your health care provider. Document Released: 12/27/2008 Document Revised: 08/08/2015 Document  Reviewed: 04/06/2014  2017 [employer]  Health Maintenance, Female Introduction Adopting a healthy lifestyle and getting preventive care can go a long way to promote health and wellness. Talk with your health care provider about what schedule of regular examinations is right  for you. This is a good chance for you to check in with your provider about disease prevention and staying healthy. In between checkups, there are plenty of things you can do on your own. Experts have done a lot of research about which lifestyle changes and preventive measures are most likely to keep you healthy. Ask your health care provider for more information. Weight and diet Eat a healthy diet  Be sure to include plenty of vegetables, fruits, low-fat dairy products, and lean protein.  Do not eat a lot of foods high in solid fats, added sugars, or salt.  Get regular exercise. This is one of the most important things you can do for your health.  Most adults should exercise for at least 150 minutes each week. The exercise should increase your heart rate and make you sweat (moderate-intensity exercise).  Most adults should also do strengthening exercises at least twice a week. This is in addition to the moderate-intensity exercise. Maintain a healthy weight  Body mass index (BMI) is a measurement that can be used to identify possible weight problems. It estimates body fat based on height and weight. Your health care provider can help determine your BMI and help you achieve or maintain a healthy weight.  For females 91 years of age and older:  A BMI below 18.5 is considered underweight.  A BMI of 18.5 to 24.9 is normal.  A BMI of 25 to 29.9 is considered overweight.  A BMI of 30 and above is considered obese. Watch levels of cholesterol and blood lipids  You should start having your blood tested for lipids and cholesterol at 78 years of age, then have this test every 5 years.  You may need to have your  cholesterol levels checked more often if:  Your lipid or cholesterol levels are high.  You are older than 78 years of age.  You are at high risk for heart disease. Cancer screening Lung Cancer  Lung cancer screening is recommended for adults 57-41 years old who are at high risk for lung cancer because of a history of smoking.  A yearly low-dose CT scan of the lungs is recommended for people who:  Currently smoke.  Have quit within the past 15 years.  Have at least a 30-pack-year history of smoking. A pack year is smoking an average of one pack of cigarettes a day for 1 year.  Yearly screening should continue until it has been 15 years since you quit.  Yearly screening should stop if you develop a health problem that would prevent you from having lung cancer treatment. Breast Cancer  Practice breast self-awareness. This means understanding how your breasts normally appear and feel.  It also means doing regular breast self-exams. Let your health care provider know about any changes, no matter how small.  If you are in your 20s or 30s, you should have a clinical breast exam (CBE) by a health care provider every 1-3 years as part of a regular health exam.  If you are 69 or older, have a CBE every year. Also consider having a breast X-ray (mammogram) every year.  If you have a family history of breast cancer, talk to your health care provider about genetic screening.  If you are at high risk for breast cancer, talk to your health care provider about having an MRI and a mammogram every year.  Breast cancer gene (BRCA) assessment is recommended for women who have family members with BRCA-related cancers. BRCA-related cancers include:  Breast.  Ovarian.  Tubal.  Peritoneal cancers.  Results of the assessment will determine the need for genetic counseling and BRCA1 and BRCA2 testing. Cervical Cancer  Your health care provider may recommend that you be screened regularly for  cancer of the pelvic organs (ovaries, uterus, and vagina). This screening involves a pelvic examination, including checking for microscopic changes to the surface of your cervix (Pap test). You may be encouraged to have this screening done every 3 years, beginning at age 40.  For women ages 69-65, health care providers may recommend pelvic exams and Pap testing every 3 years, or they may recommend the Pap and pelvic exam, combined with testing for human papilloma virus (HPV), every 5 years. Some types of HPV increase your risk of cervical cancer. Testing for HPV may also be done on women of any age with unclear Pap test results.  Other health care providers may not recommend any screening for nonpregnant women who are considered low risk for pelvic cancer and who do not have symptoms. Ask your health care provider if a screening pelvic exam is right for you.  If you have had past treatment for cervical cancer or a condition that could lead to cancer, you need Pap tests and screening for cancer for at least 20 years after your treatment. If Pap tests have been discontinued, your risk factors (such as having a new sexual partner) need to be reassessed to determine if screening should resume. Some women have medical problems that increase the chance of getting cervical cancer. In these cases, your health care provider may recommend more frequent screening and Pap tests. Colorectal Cancer  This type of cancer can be detected and often prevented.  Routine colorectal cancer screening usually begins at 78 years of age and continues through 78 years of age.  Your health care provider may recommend screening at an earlier age if you have risk factors for colon cancer.  Your health care provider may also recommend using home test kits to check for hidden blood in the stool.  A small camera at the end of a tube can be used to examine your colon directly (sigmoidoscopy or colonoscopy). This is done to check for  the earliest forms of colorectal cancer.  Routine screening usually begins at age 79.  Direct examination of the colon should be repeated every 5-10 years through 78 years of age. However, you may need to be screened more often if early forms of precancerous polyps or small growths are found. Skin Cancer  Check your skin from head to toe regularly.  Tell your health care provider about any new moles or changes in moles, especially if there is a change in a mole's shape or color.  Also tell your health care provider if you have a mole that is larger than the size of a pencil eraser.  Always use sunscreen. Apply sunscreen liberally and repeatedly throughout the day.  Protect yourself by wearing long sleeves, pants, a wide-brimmed hat, and sunglasses whenever you are outside. Heart disease, diabetes, and high blood pressure  High blood pressure causes heart disease and increases the risk of stroke. High blood pressure is more likely to develop in:  People who have blood pressure in the high end of the normal range (130-139/85-89 mm Hg).  People who are overweight or obese.  People who are African American.  If you are 82-68 years of age, have your blood pressure checked every 3-5 years. If you are 63 years of age  or older, have your blood pressure checked every year. You should have your blood pressure measured twice-once when you are at a hospital or clinic, and once when you are not at a hospital or clinic. Record the average of the two measurements. To check your blood pressure when you are not at a hospital or clinic, you can use:  An automated blood pressure machine at a pharmacy.  A home blood pressure monitor.  If you are between 41 years and 25 years old, ask your health care provider if you should take aspirin to prevent strokes.  Have regular diabetes screenings. This involves taking a blood sample to check your fasting blood sugar level.  If you are at a normal weight and  have a low risk for diabetes, have this test once every three years after 78 years of age.  If you are overweight and have a high risk for diabetes, consider being tested at a younger age or more often. Preventing infection Hepatitis B  If you have a higher risk for hepatitis B, you should be screened for this virus. You are considered at high risk for hepatitis B if:  You were born in a country where hepatitis B is common. Ask your health care provider which countries are considered high risk.  Your parents were born in a high-risk country, and you have not been immunized against hepatitis B (hepatitis B vaccine).  You have HIV or AIDS.  You use needles to inject street drugs.  You live with someone who has hepatitis B.  You have had sex with someone who has hepatitis B.  You get hemodialysis treatment.  You take certain medicines for conditions, including cancer, organ transplantation, and autoimmune conditions. Hepatitis C  Blood testing is recommended for:  Everyone born from [birth year] through 1967.  Anyone with known risk factors for hepatitis C. Sexually transmitted infections (STIs)  You should be screened for sexually transmitted infections (STIs) including gonorrhea and chlamydia if:  You are sexually active and are younger than 78 years of age.  You are older than 78 years of age and your health care provider tells you that you are at risk for this type of infection.  Your sexual activity has changed since you were last screened and you are at an increased risk for chlamydia or gonorrhea. Ask your health care provider if you are at risk.  If you do not have HIV, but are at risk, it may be recommended that you take a prescription medicine daily to prevent HIV infection. This is called pre-exposure prophylaxis (PrEP). You are considered at risk if:  You are sexually active and do not regularly use condoms or know the HIV status of your partner(s).  You take drugs by  injection.  You are sexually active with a partner who has HIV. Talk with your health care provider about whether you are at high risk of being infected with HIV. If you choose to begin PrEP, you should first be tested for HIV. You should then be tested every 3 months for as long as you are taking PrEP. Pregnancy  If you are premenopausal and you may become pregnant, ask your health care provider about preconception counseling.  If you may become pregnant, take 400 to 800 micrograms (mcg) of folic acid every day.  If you want to prevent pregnancy, talk to your health care provider about birth control (contraception). Osteoporosis and menopause  Osteoporosis is a disease in which the bones lose minerals and strength  with aging. This can result in serious bone fractures. Your risk for osteoporosis can be identified using a bone density scan.  If you are 27 years of age or older, or if you are at risk for osteoporosis and fractures, ask your health care provider if you should be screened.  Ask your health care provider whether you should take a calcium or vitamin D supplement to lower your risk for osteoporosis.  Menopause may have certain physical symptoms and risks.  Hormone replacement therapy may reduce some of these symptoms and risks. Talk to your health care provider about whether hormone replacement therapy is right for you. Follow these instructions at home:  Schedule regular health, dental, and eye exams.  Stay current with your immunizations.  Do not use any tobacco products including cigarettes, chewing tobacco, or electronic cigarettes.  If you are pregnant, do not drink alcohol.  If you are breastfeeding, limit how much and how often you drink alcohol.  Limit alcohol intake to no more than 1 drink per day for nonpregnant women. One drink equals 12 ounces of beer, 5 ounces of wine, or 1 ounces of hard liquor.  Do not use street drugs.  Do not share needles.  Ask  your health care provider for help if you need support or information about quitting drugs.  Tell your health care provider if you often feel depressed.  Tell your health care provider if you have ever been abused or do not feel safe at home. This information is not intended to replace advice given to you by your health care provider. Make sure you discuss any questions you have with your health care provider. Document Released: 09/15/2010 Document Revised: 08/08/2015 Document Reviewed: 12/04/2014  2017 [employer]

## 2016-04-15 ENCOUNTER — Ambulatory Visit: Payer: Medicare Other | Admitting: Internal Medicine

## 2016-04-15 NOTE — Telephone Encounter (Signed)
fup from [facility] States she is seeing Dr. Katy Fitch 2017 and again in Jan Call to Dr. Katy Fitch and requested eye report be sent to [facility] exam confirmed for 01/13/2016 and 03/17/2016 Will fax over

## 2016-04-20 ENCOUNTER — Encounter: Payer: Self-pay | Admitting: Internal Medicine

## 2016-04-20 ENCOUNTER — Ambulatory Visit (INDEPENDENT_AMBULATORY_CARE_PROVIDER_SITE_OTHER): Payer: Medicare Other | Admitting: Internal Medicine

## 2016-04-20 VITALS — BP 162/80 | HR 62 | Temp 97.7°F | Ht 65.0 in | Wt 174.0 lb

## 2016-04-20 DIAGNOSIS — I2584 Coronary atherosclerosis due to calcified coronary lesion: Secondary | ICD-10-CM | POA: Diagnosis not present

## 2016-04-20 DIAGNOSIS — I10 Essential (primary) hypertension: Secondary | ICD-10-CM

## 2016-04-20 DIAGNOSIS — I2510 Atherosclerotic heart disease of native coronary artery without angina pectoris: Secondary | ICD-10-CM | POA: Diagnosis not present

## 2016-04-20 DIAGNOSIS — E7800 Pure hypercholesterolemia, unspecified: Secondary | ICD-10-CM

## 2016-04-20 DIAGNOSIS — R809 Proteinuria, unspecified: Secondary | ICD-10-CM

## 2016-04-20 DIAGNOSIS — I890 Lymphedema, not elsewhere classified: Secondary | ICD-10-CM

## 2016-04-20 DIAGNOSIS — E1329 Other specified diabetes mellitus with other diabetic kidney complication: Secondary | ICD-10-CM

## 2016-04-20 MED ORDER — VALSARTAN-HYDROCHLOROTHIAZIDE 320-25 MG PO TABS: 1.0000 | ORAL_TABLET | Freq: Every day | ORAL | 4 refills | Status: DC

## 2016-04-20 NOTE — Progress Notes (Signed)
Subjective:    Patient ID: Donna May, female    DOB: [birth year], 76 y.o.   MRN: [id number]  HPI  76 year old patient who is seen today for an annual examination in Medicare wellness visit.  She is followed by multiple consultants. Medical problems include a history of right breast cancer.  She has essential hypertension, type 2 diabetes, dyslipidemia. She is followed by oncology and has had fairly recent chest CT and bone scan. Patient states the systolic blood pressures generally run about 160. No cardiopulmonary complaints.  She is followed by cardiology  Family history father died at 64 had a history of arthritis and senile dementia Mother died at age 9 her first MI at 41 status post CABG. She also had MS 2 brothers both with coronary artery disease one brother deceased also a history of diabetes.  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 Anemia   . Arthritis   . Breast cancer ([place]) 1992  . Diabetes mellitus    type 2  . Duodenitis 01/18/2002  . Fainting spell   . GERD (gastroesophageal reflux disease)   . Heart murmur   . Hiatal hernia 08/08/2008, 01/18/2002  . History of pneumonia   . Hyperlipidemia   . Hypertension   . UTI (lower urinary tract infection)      Social History   Social History  . Marital status: Single    Spouse name: N/A  . Number of children: 2  . Years of education: N/A   Occupational History  . retired    Social History Main Topics  . Smoking status: Former Smoker    Packs/day: 0.01    Years: 10.00  . Smokeless tobacco: Never Used     Comment: quit 76 yo;   . Alcohol use 0.0 oz/week     Comment: occ  . Drug use: No  . Sexual activity: Not on file   Other Topics Concern  . Not on file   Social History Narrative  . No narrative on  file    Past Surgical History:  Procedure Laterality Date  . AXILLARY SURGERY     cyst removal, right  . MASTECTOMY  1992   right, with flap  . NM MYOCAR PERF WALL MOTION  06/11/2009   Protocol:Bruce, post stress EF58%, EKG negative for ischemia, low risk  . TONSILLECTOMY    . TRANSTHORACIC ECHOCARDIOGRAM  12/24/2009   LVEF =>55%, normal study    Family History  Problem Relation Age of Onset  . Breast cancer Cousin   . Diabetes Brother   . Heart disease Mother   . Hypertension Mother   . Heart disease Father   . Hypertension Father   . Prostate cancer Brother   . Heart attack Maternal Grandmother   . Stroke Maternal Grandfather     No Known Allergies  Current Outpatient Prescriptions on File Prior to Visit  Medication Sig Dispense Refill  . AMBULATORY NON FORMULARY MEDICATION Place 0.0125 g rectally 2 (two) times daily. Medication Name: Nitroglycerin ointment .8592. Apply 2-3 times daily as needed. 1 Tube 1  . amLODipine (NORVASC) 10 MG tablet TAKE 1 TABLET (10 MG TOTAL) BY MOUTH DAILY. NEED OFFICE VISIT 90 tablet 3  . aspirin 81 MG tablet Take 81 mg by mouth daily.    Marland Kitchen CALCIUM-MAGNESIUM-VITAMIN D  PO Take 1 tablet by mouth daily.    . Cholecalciferol (VITAMIN D3) 2000 UNITS TABS Take 1 tablet by mouth daily.    . CRESTOR 20 MG tablet Take 1 tablet (20 mg total) by mouth daily. 90 tablet 0  . Echinacea 400 MG CAPS Take 1 capsule by mouth daily.    Marland Kitchen EVISTA 60 MG tablet Take 60 mg by mouth daily.     Marland Kitchen JANUVIA 100 MG tablet Take 100 mg by mouth daily.     . lansoprazole (PREVACID) 30 MG capsule TAKE 1 CAPSULE DAILY BEFOREBREAKFAST 90 capsule 1  . metFORMIN (GLUCOPHAGE-XR) 500 MG 24 hr tablet Take 500 mg by mouth 2 (two) times daily before a meal.     . metoprolol succinate (TOPROL-XL) 100 MG 24 hr tablet Take 1 tablet (100 mg total) by mouth daily. Take with or immediately following a meal. 90 tablet 1  . Multiple Vitamins-Minerals (CENTRUM SILVER ULTRA WOMENS) TABS Take 1  tablet by mouth daily.    . Omega 3 1000 MG CAPS Take 1 capsule by mouth daily.    . rosuvastatin (CRESTOR) 20 MG tablet TAKE 1 TABLET DAILY PLEASE CALL OFFICE FOR FURTHER    REFILLS 90 tablet 0  . valsartan-hydrochlorothiazide (DIOVAN-HCT) 320-12.5 MG tablet TAKE 1 TABLET BY MOUTH DAILY. NEED OV. 90 tablet 1  . albuterol (PROVENTIL HFA;VENTOLIN HFA) 108 (90 Base) MCG/ACT inhaler Inhale 1-2 puffs into the lungs every 6 (six) hours as needed for wheezing or shortness of breath. (Patient not taking: Reported on 04/20/2016) 1 Inhaler 0   Current Facility-Administered Medications on File Prior to Visit  Medication Dose Route Frequency Provider Last Rate Last Dose  . triamcinolone acetonide (KENALOG) 10 MG/ML injection 10 mg  10 mg Other Once [employer], DPM        BP (!) 162/80 (BP Location: Left Arm, Patient Position: Sitting, Cuff Size: Normal)   Pulse 62   Temp 97.7 F (36.5 C) (Oral)   Ht 5\' 5"  (1.651 m)   Wt 174 lb (78.9 kg)   SpO2 90%   BMI 28.96 kg/m     Review of Systems  Constitutional: Negative.   HENT: Negative for congestion, dental problem, hearing loss, rhinorrhea, sinus pressure, sore throat and tinnitus.   Eyes: Negative for pain, discharge and visual disturbance.  Respiratory: Negative for cough and shortness of breath.   Cardiovascular: Negative for chest pain, palpitations and leg swelling.  Gastrointestinal: Negative for abdominal distention, abdominal pain, blood in stool, constipation, diarrhea, nausea and vomiting.  Genitourinary: Negative for difficulty urinating, dysuria, flank pain, frequency, hematuria, pelvic pain, urgency, vaginal bleeding, vaginal discharge and vaginal pain.  Musculoskeletal: Positive for arthralgias. Negative for gait problem and joint swelling.       Right shoulder and knee pain  Skin: Negative for rash.  Neurological: Negative for dizziness, syncope, speech difficulty, weakness, numbness and headaches.  Hematological: Negative for  adenopathy.  Psychiatric/Behavioral: Negative for agitation, behavioral problems and dysphoric mood. The patient is not nervous/anxious.        Objective:   Physical Exam  Constitutional: She is oriented to person, place, and time. She appears well-developed and well-nourished.  Blood pressure 160/90  HENT:  Head: Normocephalic and atraumatic.  Right Ear: External ear normal.  Left Ear: External ear normal.  Mouth/Throat: Oropharynx is clear and moist.  Eyes: Conjunctivae and EOM are normal.  Neck: Normal range of motion. Neck supple. No JVD present. No thyromegaly present.  Cardiovascular: Normal rate, regular rhythm, normal  heart sounds and intact distal pulses.   No murmur heard. Pulmonary/Chest: Effort normal and breath sounds normal. She has no wheezes. She has no rales.  Status post partial right mastectomy area and axilla clear  Abdominal: Soft. Bowel sounds are normal. She exhibits no distension and no mass. There is no tenderness. There is no rebound and no guarding.  Genitourinary: Vagina normal.  Musculoskeletal: Normal range of motion. She exhibits no edema or tenderness.  Neurological: She is alert and oriented to person, place, and time. She has normal reflexes. No cranial nerve deficit. She exhibits normal muscle tone. Coordination normal.  Skin: Skin is warm and dry. No rash noted.  Chronic right arm lymphedema  Psychiatric: She has a normal mood and affect. Her behavior is normal.          Assessment & Plan:   Diabetes mellitus.   Lab Results  Component Value Date   HGBA1C 6.6 01/27/2016   Hypertension.  Suboptimal control.  Will increase hydrochlorothiazide to 25 mg daily History right breast cancer.  Follow-up oncology Coronary artery calcification.  Follow-up cardiology.  Continue aggressive risk factor modification  Home blood pressure monitoring.  Encouraged .  Follow-up 3 months  Nyoka Cowden

## 2016-04-20 NOTE — Progress Notes (Signed)
Pre visit review using our clinic review tool, if applicable. No additional management support is needed unless otherwise documented below in the visit note. 

## 2016-04-20 NOTE — Patient Instructions (Signed)
Limit your sodium (Salt) intake    It is important that you exercise regularly, at least 20 minutes 3 to 4 times per week.  If you develop chest pain or shortness of breath seek  medical attention.  You need to lose weight.  Consider a lower calorie diet and regular exercise.   Please check your hemoglobin A1c every 3 months  Please check your blood pressure on a regular basis.  If it is consistently greater than 150/90, please make an office appointment.

## 2016-05-14 DIAGNOSIS — Z853 Personal history of malignant neoplasm of breast: Secondary | ICD-10-CM | POA: Diagnosis not present

## 2016-05-14 LAB — HM MAMMOGRAPHY

## 2016-05-19 ENCOUNTER — Encounter: Payer: Self-pay | Admitting: Internal Medicine

## 2016-05-25 ENCOUNTER — Other Ambulatory Visit: Payer: Self-pay | Admitting: Cardiovascular Disease

## 2016-06-11 DIAGNOSIS — E559 Vitamin D deficiency, unspecified: Secondary | ICD-10-CM | POA: Diagnosis not present

## 2016-06-11 DIAGNOSIS — E1069 Type 1 diabetes mellitus with other specified complication: Secondary | ICD-10-CM | POA: Diagnosis not present

## 2016-06-11 DIAGNOSIS — M8580 Other specified disorders of bone density and structure, unspecified site: Secondary | ICD-10-CM | POA: Diagnosis not present

## 2016-06-11 DIAGNOSIS — E1065 Type 1 diabetes mellitus with hyperglycemia: Secondary | ICD-10-CM | POA: Diagnosis not present

## 2016-06-11 DIAGNOSIS — Z7984 Long term (current) use of oral hypoglycemic drugs: Secondary | ICD-10-CM | POA: Diagnosis not present

## 2016-06-11 DIAGNOSIS — E7800 Pure hypercholesterolemia, unspecified: Secondary | ICD-10-CM | POA: Diagnosis not present

## 2016-06-11 DIAGNOSIS — I10 Essential (primary) hypertension: Secondary | ICD-10-CM | POA: Diagnosis not present

## 2016-06-11 DIAGNOSIS — E119 Type 2 diabetes mellitus without complications: Secondary | ICD-10-CM | POA: Diagnosis not present

## 2016-06-15 DIAGNOSIS — Z961 Presence of intraocular lens: Secondary | ICD-10-CM | POA: Diagnosis not present

## 2016-06-15 DIAGNOSIS — H20013 Primary iridocyclitis, bilateral: Secondary | ICD-10-CM | POA: Diagnosis not present

## 2016-06-25 ENCOUNTER — Telehealth: Payer: Self-pay | Admitting: *Deleted

## 2016-06-25 MED ORDER — RALOXIFENE HCL 60 MG PO TABS: 60.0000 mg | ORAL_TABLET | Freq: Every day | ORAL | 1 refills | Status: DC

## 2016-06-25 NOTE — Telephone Encounter (Signed)
Pt called requesting refill on Evista 60 mg tablet, patient had annual with Dr.Lavoie on 10/29/2015. Rx will be refilled until 10/2016

## 2016-08-03 ENCOUNTER — Other Ambulatory Visit: Payer: Self-pay | Admitting: Cardiovascular Disease

## 2016-08-17 ENCOUNTER — Ambulatory Visit (INDEPENDENT_AMBULATORY_CARE_PROVIDER_SITE_OTHER): Payer: Medicare Other | Admitting: Cardiovascular Disease

## 2016-08-17 ENCOUNTER — Encounter: Payer: Self-pay | Admitting: Cardiovascular Disease

## 2016-08-17 VITALS — BP 140/76 | HR 59 | Ht 65.0 in | Wt 168.0 lb

## 2016-08-17 DIAGNOSIS — I10 Essential (primary) hypertension: Secondary | ICD-10-CM

## 2016-08-17 DIAGNOSIS — I2584 Coronary atherosclerosis due to calcified coronary lesion: Secondary | ICD-10-CM | POA: Diagnosis not present

## 2016-08-17 DIAGNOSIS — E7800 Pure hypercholesterolemia, unspecified: Secondary | ICD-10-CM | POA: Diagnosis not present

## 2016-08-17 DIAGNOSIS — I2510 Atherosclerotic heart disease of native coronary artery without angina pectoris: Secondary | ICD-10-CM

## 2016-08-17 DIAGNOSIS — R809 Proteinuria, unspecified: Secondary | ICD-10-CM

## 2016-08-17 DIAGNOSIS — Z9189 Other specified personal risk factors, not elsewhere classified: Secondary | ICD-10-CM | POA: Diagnosis not present

## 2016-08-17 DIAGNOSIS — E1329 Other specified diabetes mellitus with other diabetic kidney complication: Secondary | ICD-10-CM | POA: Diagnosis not present

## 2016-08-17 NOTE — Patient Instructions (Addendum)
Dr Sallyanne Kuster recommends that you continue on your current medications as directed. Please refer to the Current Medication list given to you today.  Your physician has requested that you have an exercise tolerance test. For further information please visit [email or web address]. Please also follow instruction sheet, as given.  Dr Sallyanne Kuster recommends that you schedule a follow-up appointment in 12 months. You will receive a reminder letter in the mail two months in advance. If you don't receive a letter, please call our office to schedule the follow-up appointment.  If you need a refill on your cardiac medications before your next appointment, please call your pharmacy.

## 2016-08-17 NOTE — Progress Notes (Signed)
Patient ID: Donna May, female   DOB: [birth year], 76 y.o.   MRN: [id number]    Cardiology Office Note    Date:  08/17/2016   ID:  Donna May, Donna May, Donna May, MRN [id number]  PCP:  Marletta Lor, MD  Cardiologist:   Sanda Klein, MD   Chief Complaint  Patient presents with  . Follow-up    14 MONTHS  . Chest Pain    History of Present Illness:  Donna May is a 76 y.o. female with long-standing systemic hypertension, nonobstructive bilateral renal artery disease, long-standing hyperlipidemia and type 2 diabetes mellitus with diabetic nephropathy.She quit smoking 27 years ago. She has a very strong family history of premature heart disease in both parents and one of her brothers.  She denies problems with chest pain, but inquires whether her echocardiogram shows that she does not have blockages. She has a near-normal resting ECG with subtle T-segment depression in the lateral leads that is likely due to LVH.  She is sedentary. She does not exercise or climb stairs. She denies any angina pectoris for her level of activity. She does have bilateral knee arthritis that limits her ability to be active. She also denies exertional dyspnea, palpitations, syncope, claudication, focal neurological complaints, falls or bleeding problems. She has mild edema in her lower extremities towards the end of the day, resolving by the next morning. She has developed lymphedema in her right upper extremity, following lymph node dissection for breast cancer.  Her diabetes is well controlled with her most recent A1c of 6.6%. All her lipid parameters are within desirable range on current statin dose.  Her most recent renal duplex ultrasound performed in March 2016 did not show any evidence of renal artery stenosis. She has preserved left ventricular systolic function, but has evidence of left ventricular hypertrophy by echocardiography and ECG, but has not had clinical manifestations of heart  failure.   Past Medical History:  Diagnosis Date  . Anemia   . Arthritis   . Breast cancer ([place]) 1992  . Diabetes mellitus    type 2  . Duodenitis 01/18/2002  . Fainting spell   . GERD (gastroesophageal reflux disease)   . Heart murmur   . Hiatal hernia 5/May/2010, 01/18/2002  . History of pneumonia   . Hyperlipidemia   . Hypertension   . UTI (lower urinary tract infection)     Past Surgical History:  Procedure Laterality Date  . AXILLARY SURGERY     cyst removal, right  . MASTECTOMY  1992   right, with flap  . NM MYOCAR PERF WALL MOTION  06/11/2009   Protocol:Bruce, post stress EF58%, EKG negative for ischemia, low risk  . TONSILLECTOMY    . TRANSTHORACIC ECHOCARDIOGRAM  12/24/2009   LVEF =>55%, normal study    Outpatient Medications Prior to Visit  Medication Sig Dispense Refill  . albuterol (PROVENTIL HFA;VENTOLIN HFA) 108 (90 Base) MCG/ACT inhaler Inhale 1-2 puffs into the lungs every 6 (six) hours as needed for wheezing or shortness of breath. 1 Inhaler 0  . AMBULATORY NON FORMULARY MEDICATION Place 0.0125 g rectally 2 (two) times daily. Medication Name: Nitroglycerin ointment .0865. Apply 2-3 times daily as needed. 1 Tube 1  . amLODipine (NORVASC) 10 MG tablet TAKE 1 TABLET (10 MG TOTAL) BY MOUTH DAILY. NEED OFFICE VISIT 90 tablet 0  . aspirin 81 MG tablet Take 81 mg by mouth daily.    Marland Kitchen CALCIUM-MAGNESIUM-VITAMIN D PO Take 1 tablet by mouth daily.    Marland Kitchen  Cholecalciferol (VITAMIN D3) 2000 UNITS TABS Take 1 tablet by mouth daily.    . CRESTOR 20 MG tablet Take 1 tablet (20 mg total) by mouth daily. 90 tablet 0  . Echinacea 400 MG CAPS Take 1 capsule by mouth daily.    Marland Kitchen JANUVIA 100 MG tablet Take 100 mg by mouth daily.     . lansoprazole (PREVACID) 30 MG capsule TAKE 1 CAPSULE DAILY BEFOREBREAKFAST 90 capsule 1  . metFORMIN (GLUCOPHAGE-XR) 500 MG 24 hr tablet Take 500 mg by mouth 2 (two) times daily before a meal.     . metoprolol succinate (TOPROL-XL) 100 MG 24 hr  tablet TAKE 1 TABLET (100 MG TOTAL) BY MOUTH DAILY. TAKE WITH OR IMMEDIATELY FOLLOWING A MEAL. 90 tablet 1  . Multiple Vitamins-Minerals (CENTRUM SILVER ULTRA WOMENS) TABS Take 1 tablet by mouth daily.    . Omega 3 1000 MG CAPS Take 1 capsule by mouth daily.    . raloxifene (EVISTA) 60 MG tablet Take 1 tablet (60 mg total) by mouth daily. 90 tablet 1  . rosuvastatin (CRESTOR) 20 MG tablet TAKE 1 TABLET DAILY PLEASE CALL OFFICE FOR FURTHER    REFILLS 90 tablet 0  . valsartan-hydrochlorothiazide (DIOVAN-HCT) 320-25 MG tablet Take 1 tablet by mouth daily. 90 tablet 4   Facility-Administered Medications Prior to Visit  Medication Dose Route Frequency Provider Last Rate Last Dose  . triamcinolone acetonide (KENALOG) 10 MG/ML injection 10 mg  10 mg Other Once Harriet [employer], DPM         Allergies:   Patient has no known allergies.   Social History   Social History  . Marital status: Single    Spouse name: N/A  . Number of children: 2  . Years of education: N/A   Occupational History  . retired    Social History Main Topics  . Smoking status: Former Smoker    Packs/day: 0.01    Years: 10.00  . Smokeless tobacco: Never Used     Comment: quit 76 yo;   . Alcohol use 0.0 oz/week     Comment: occ  . Drug use: No  . Sexual activity: Not Asked   Other Topics Concern  . None   Social History Narrative  . None     Family History:  The patient's family history includes Breast cancer in her cousin; Diabetes in her brother; Heart attack in her maternal grandmother; Heart disease in her father and mother; Hypertension in her father and mother; Prostate cancer in her brother; Stroke in her maternal grandfather.   ROS:   Please see the history of present illness.    ROS All other systems reviewed and are negative.   PHYSICAL EXAM:   VS:  BP 140/76   Pulse (!) 59   Ht 5\' 5"  (1.651 m)   Wt 168 lb (76.2 kg)   BMI 27.96 kg/m     General: Alert, oriented x3, Well nourished, well  developed, in no acute distress  Head: no evidence of trauma, PERRL, EOMI, no exophtalmos or lid lag, no myxedema, no xanthelasma; normal ears, nose and oropharynx Neck: normal jugular venous pulsations and no hepatojugular reflux; brisk carotid pulses without delay and no carotid bruits Chest: clear to auscultation, no signs of consolidation by percussion or palpation, normal fremitus, symmetrical and full respiratory excursions Cardiovascular: normal position and quality of the apical impulse, regular rhythm, normal first and second heart sounds, no murmurs, rubs or gallops Abdomen: no tenderness or distention, no masses by palpation,  no abnormal pulsatility or arterial bruits, normal bowel sounds, no hepatosplenomegaly Extremities: no clubbing, cyanosis or edema, other than lymphedema of the right forearm and hand; 2+ radial, ulnar and brachial pulses bilaterally; 2+ right femoral, posterior tibial and dorsalis pedis pulses; 2+ left femoral, posterior tibial and dorsalis pedis pulses; no subclavian or femoral bruits Neurological: grossly nonfocal Psych: euthymic mood, full affect  Wt Readings from Last 3 Encounters:  08/17/16 168 lb (76.2 kg)  04/20/16 174 lb (78.9 kg)  01/May/18 174 lb (78.9 kg)      Studies/Labs Reviewed:   EKG:  EKG is ordered today.  The ekg ordered today demonstrates , sinus rhythm, Left atrial abnormality, left ventricular hypertrophy with very mild (under 0.5 mm) ST segment depression in the lateral leads  Recent Labs: 10/24/2015: TSH 1.55 12/17/2015: Hemoglobin 12.3; Platelets 234 01/27/2016: ALT 12; BUN 16; Creatinine 1.1; Potassium 4.0; Sodium 142   July 2017 total cholesterol 141, triglycerides 62, HDL 66, LDL 63, A1c 6.5%  ASSESSMENT:    1. Essential hypertension   2. Other specified diabetes mellitus with microalbuminuria, unspecified whether long term insulin use ([place])   3. HYPERCHOLESTEROLEMIA   4. Coronary artery calcification   5. At risk for  coronary artery disease      PLAN:  In order of problems listed above:  1. HTN: Her blood pressure is Fairly well controlled. Usually her systolic blood pressures around the 130 range. Diagnosis of renal artery stenosis was not confirmed by her most recent ultrasound study. Aim for sodium restriction. No additional changes made to her medications. 2. DM: Excellent glycemic control on metformin and Januvia, A1c 6.5% 3. HLP: Lipid parameters within the desirable range. Target LDL under 100, preferably under 70. 4. Numerous risk factors and sedentary lifestyle, without functional assessment since 2011. Will schedule for a plain treadmill stress test. Hopefully her knee arthritis will allow sufficient level of exercise.    Medication Adjustments/Labs and Tests Ordered: Current medicines are reviewed at length with the patient today.  Concerns regarding medicines are outlined above.  Medication changes, Labs and Tests ordered today are listed in the Patient Instructions below. Patient Instructions  Dr Sallyanne Kuster recommends that you continue on your current medications as directed. Please refer to the Current Medication list given to you today.  Your physician has requested that you have an exercise tolerance test. For further information please visit [email or web address]. Please also follow instruction sheet, as given.  Dr Sallyanne Kuster recommends that you schedule a follow-up appointment in 12 months. You will receive a reminder letter in the mail two months in advance. If you don't receive a letter, please call our office to schedule the follow-up appointment.  If you need a refill on your cardiac medications before your next appointment, please call your pharmacy.      Signed, Sanda Klein, MD  08/17/2016 5:49 PM    [facility] [place], [place], [place]  [address] Phone: [phone]; Fax: [phone]

## 2016-08-19 ENCOUNTER — Telehealth: Payer: Self-pay | Admitting: Cardiovascular Disease

## 2016-08-19 NOTE — Telephone Encounter (Signed)
Left message for patient to call and schedule exercise tolerance test per Dr. Loletha Grayer,

## 2016-08-26 NOTE — Progress Notes (Signed)
CLINIC:  Survivorship   REASON FOR VISIT:  Routine follow-up for history of breast cancer.   BRIEF ONCOLOGIC HISTORY:  Per Dr. [name] Dad 12/27/2015 note  (1) Status post right mastectomy and axillary lymph node dissection March 1992 for what by the patient's recollection was a noninvasive breast cancer  (2) right upper extremity lymphedema developing August 2017             (a) right upper extremity Doppler ultrasonography 10/29/2015 shows no evidence of clot             (b) right axillary ultrasonography at [facility] 10/31/2015 shows a single likely benign lymph node             (c) chest CT and bone scan showed no evidence of recurrence or metastatic disease   INTERVAL HISTORY:  [name] presents to the [facility] today for routine follow-up for her history of breast cancer.  Overall, she reports feeling quite well.   She has been doing well since her visit with Dr. Jana Hakim in 12/2015.  She developed right arm lymphedema that she developed in 10/2015.  She would like more information on additional modalities and devices to help improve her lymphedema.  She is wearing her lymphedema glove and sleeve, but doesn't all the time due to the feeling of her arm being "like a log."  She does have difficulty with dressing and not being able to wear certain clothes due to this.      REVIEW OF SYSTEMS:  Review of Systems  Constitutional: Negative for appetite change, chills, diaphoresis, fatigue, fever and unexpected weight change.  HENT:   Negative for hearing loss and lump/mass.   Eyes: Negative for eye problems and icterus.  Respiratory: Negative for chest tightness, cough and shortness of breath.   Cardiovascular: Negative for chest pain, leg swelling and palpitations.  Gastrointestinal: Negative for abdominal distention and abdominal pain.  Endocrine: Negative for hot flashes.  Genitourinary: Negative for difficulty urinating.   Musculoskeletal: Negative for arthralgias.    Skin: Negative for itching and rash.  Neurological: Negative for dizziness, extremity weakness and headaches.  Hematological: Negative for adenopathy. Does not bruise/bleed easily.  Psychiatric/Behavioral: Negative for depression. The patient is not nervous/anxious.   Breast: Denies any new nodularity, masses, tenderness, nipple changes, or nipple discharge.        PAST MEDICAL/SURGICAL HISTORY:  Past Medical History:  Diagnosis Date  . Anemia   . Arthritis   . Breast cancer ([place]) 1992  . Diabetes mellitus    type 2  . Duodenitis 01/18/2002  . Fainting spell   . GERD (gastroesophageal reflux disease)   . Heart murmur   . Hiatal hernia 08/08/2008, 01/18/2002  . History of pneumonia   . Hyperlipidemia   . Hypertension   . UTI (lower urinary tract infection)    Past Surgical History:  Procedure Laterality Date  . AXILLARY SURGERY     cyst removal, right  . MASTECTOMY  1992   right, with flap  . NM MYOCAR PERF WALL MOTION  06/11/2009   Protocol:Bruce, post stress EF58%, EKG negative for ischemia, low risk  . TONSILLECTOMY    . TRANSTHORACIC ECHOCARDIOGRAM  12/24/2009   LVEF =>55%, normal study     ALLERGIES:  No Known Allergies   CURRENT MEDICATIONS:  Outpatient Encounter Prescriptions as of 08/27/2016  Medication Sig Note  . albuterol (PROVENTIL HFA;VENTOLIN HFA) 108 (90 Base) MCG/ACT inhaler Inhale 1-2 puffs into the lungs every 6 (six) hours as needed  for wheezing or shortness of breath. 04/10/2016: Has it if she needs it  . AMBULATORY NON FORMULARY MEDICATION Place 0.0125 g rectally 2 (two) times daily. Medication Name: Nitroglycerin ointment .4665. Apply 2-3 times daily as needed.   Marland Kitchen amLODipine (NORVASC) 10 MG tablet TAKE 1 TABLET (10 MG TOTAL) BY MOUTH DAILY. NEED OFFICE VISIT   . aspirin 81 MG tablet Take 81 mg by mouth daily.   Marland Kitchen CALCIUM-MAGNESIUM-VITAMIN D PO Take 1 tablet by mouth daily.   . Cholecalciferol (VITAMIN D3) 2000 UNITS TABS Take 1 tablet by mouth  daily. 04/10/2016: Sometimes takes 5000 per MD   . CRESTOR 20 MG tablet Take 1 tablet (20 mg total) by mouth daily.   . Echinacea 400 MG CAPS Take 1 capsule by mouth daily.   Marland Kitchen JANUVIA 100 MG tablet Take 100 mg by mouth daily.    . lansoprazole (PREVACID) 30 MG capsule TAKE 1 CAPSULE DAILY BEFOREBREAKFAST   . metFORMIN (GLUCOPHAGE-XR) 500 MG 24 hr tablet Take 500 mg by mouth 2 (two) times daily before a meal.    . metoprolol succinate (TOPROL-XL) 100 MG 24 hr tablet TAKE 1 TABLET (100 MG TOTAL) BY MOUTH DAILY. TAKE WITH OR IMMEDIATELY FOLLOWING A MEAL.   . Multiple Vitamins-Minerals (CENTRUM SILVER ULTRA WOMENS) TABS Take 1 tablet by mouth daily.   . Omega 3 1000 MG CAPS Take 1 capsule by mouth daily.   . raloxifene (EVISTA) 60 MG tablet Take 1 tablet (60 mg total) by mouth daily.   . rosuvastatin (CRESTOR) 20 MG tablet TAKE 1 TABLET DAILY PLEASE CALL OFFICE FOR FURTHER    REFILLS   . valsartan-hydrochlorothiazide (DIOVAN-HCT) 320-25 MG tablet Take 1 tablet by mouth daily.    Facility-Administered Encounter Medications as of 08/27/2016  Medication  . triamcinolone acetonide (KENALOG) 10 MG/ML injection 10 mg     ONCOLOGIC FAMILY HISTORY:  Family History  Problem Relation Age of Onset  . Breast cancer Cousin   . Diabetes Brother   . Heart disease Mother   . Hypertension Mother   . Heart disease Father   . Hypertension Father   . Prostate cancer Brother   . Heart attack Maternal Grandmother   . Stroke Maternal Grandfather     GENETIC COUNSELING/TESTING: Not indicated at this time  SOCIAL HISTORY:  Donna May is widowed and lives alone in [place], [place].  She has 2 children and they live in [place].  [name] is currently retired.  She denies any current or history of tobacco, alcohol, or illicit drug use.     PHYSICAL EXAMINATION:  Vital Signs: Vitals:   08/27/16 1045  BP: (!) 140/59  Pulse: 65  Resp: 18  Temp: 98.5 F (36.9 C)   Filed Weights    08/27/16 1045  Weight: 166 lb 9.6 oz (75.6 kg)   General: Well-nourished, well-appearing female in no acute distress.  Unaccompanied today.   HEENT: Head is normocephalic.  Pupils equal and reactive to light. Conjunctivae clear without exudate.  Sclerae anicteric. Oral mucosa is pink, moist.  Oropharynx is pink without lesions or erythema.  Lymph: No cervical, supraclavicular, or infraclavicular lymphadenopathy noted on palpation.  Cardiovascular: Regular rate and rhythm.Marland Kitchen Respiratory: Clear to auscultation bilaterally. Chest expansion symmetric; breathing non-labored.  Breast Exam:  -Left breast: No appreciable masses on palpation. No skin redness, thickening, or peau d'orange appearance; no nipple retraction or nipple discharge; -Right breast: No appreciable masses on palpation. No skin redness, thickening, or peau d'orange appearance; no nipple  retraction or nipple discharge; s/p reconstruction, scars well healed, no sign of recurrence -Axilla: No axillary adenopathy bilaterally.  GI: Abdomen soft and round; non-tender, non-distended. Bowel sounds normoactive. No hepatosplenomegaly.   GU: Deferred.  Neuro: No focal deficits. Steady gait.  Psych: Mood and affect normal and appropriate for situation.  Extremities: right arm lymphedema present Skin: Warm and dry.  LABORATORY DATA:  None for this visit   DIAGNOSTIC IMAGING:      ASSESSMENT AND PLAN:  Ms.. Saidi is a pleasant 76 y.o. female with history of breast cancer from 35 with new lymphedema beginning in 2017.  [facility] for surveillance and routine follow-up.   1. History of breast cancer:  Ms. Yasin is currently clinically and radiographically without evidence of disease or recurrence of breast cancer. She will be due for mammogram in 11/2016 which will be ordered by her GYN.  I encouraged her to call me with any questions or concerns before her next visit at the cancer center, and I would be happy  to see her sooner, if needed.    2. Lymphedema: I reached out to Leone Payor in the physical therapy department to have someone call the patient and discuss alternate modalities to work with her lymphedema.  They are going to call her this week.  I recommended she continue wearing her sleeve and glove.  3. Bone health:  Given Ms. Hargrove age, history of breast cancer, she is at risk for bone demineralization. Her last DEXA scan was 01/2014 and demonstrated osteopenia with a T score of -1.4 in the femoral neck.  In the meantime, she was encouraged to increase her consumption of foods rich in calcium, as well as increase her weight-bearing activities.  She was given education on specific food and activities to promote bone health.  4. Cancer screening:  Due to Ms. Burgert history and her age, she should receive screening for skin cancers, colon cancer. She was encouraged to follow-up with her PCP for appropriate cancer screenings.   5. Health maintenance and wellness promotion: Ms. Brisbin was encouraged to consume 5-7 servings of fruits and vegetables per day. She was also encouraged to engage in moderate to vigorous exercise for 30 minutes per day most days of the week. She was instructed to limit her alcohol consumption and continue to abstain from tobacco use.    Dispo:  -Return to cancer center in one year for LTS visit -Mammogram in 11/2016 when due   A total of (30) minutes of face-to-face time was spent with this patient with greater than 50% of that time in counseling and care-coordination.   Gardenia Phlegm, [place] [phone]   Note: PRIMARY CARE PROVIDER Marletta Lor, [place] [phone]

## 2016-08-27 ENCOUNTER — Encounter: Payer: Self-pay | Admitting: Adult Health

## 2016-08-27 ENCOUNTER — Ambulatory Visit (HOSPITAL_BASED_OUTPATIENT_CLINIC_OR_DEPARTMENT_OTHER): Payer: Medicare Other | Admitting: Adult Health

## 2016-08-27 ENCOUNTER — Telehealth: Payer: Self-pay

## 2016-08-27 VITALS — BP 140/59 | HR 65 | Temp 98.5°F | Resp 18 | Ht 65.0 in | Wt 166.6 lb

## 2016-08-27 DIAGNOSIS — M8580 Other specified disorders of bone density and structure, unspecified site: Secondary | ICD-10-CM

## 2016-08-27 DIAGNOSIS — I890 Lymphedema, not elsewhere classified: Secondary | ICD-10-CM

## 2016-08-27 DIAGNOSIS — Z86000 Personal history of in-situ neoplasm of breast: Secondary | ICD-10-CM | POA: Diagnosis not present

## 2016-08-27 DIAGNOSIS — D0511 Intraductal carcinoma in situ of right breast: Secondary | ICD-10-CM

## 2016-08-27 NOTE — Pre-Procedure Instructions (Signed)
Ultrasound [facility] March 2018

## 2016-08-27 NOTE — Telephone Encounter (Signed)
Pt called and reached out to Wilber Bihari, NP requesting a "lymphedema machine" sp she let us know. Tried calling pt at both of her (cell and home) numbers and one of her contact numbers and had to leave message at each requesting she call us back in regards to her request. Will try to follow up with her again beginning of next week.

## 2016-09-11 ENCOUNTER — Telehealth (HOSPITAL_COMMUNITY): Payer: Self-pay

## 2016-09-11 NOTE — Telephone Encounter (Signed)
Encounter complete. 

## 2016-09-14 DIAGNOSIS — E119 Type 2 diabetes mellitus without complications: Secondary | ICD-10-CM | POA: Diagnosis not present

## 2016-09-14 DIAGNOSIS — Z961 Presence of intraocular lens: Secondary | ICD-10-CM | POA: Diagnosis not present

## 2016-09-14 DIAGNOSIS — H04123 Dry eye syndrome of bilateral lacrimal glands: Secondary | ICD-10-CM | POA: Diagnosis not present

## 2016-09-14 LAB — HM DIABETES EYE EXAM

## 2016-09-17 ENCOUNTER — Encounter (HOSPITAL_COMMUNITY): Payer: Self-pay | Admitting: *Deleted

## 2016-09-17 ENCOUNTER — Ambulatory Visit (HOSPITAL_COMMUNITY)
Admission: RE | Admit: 2016-09-17 | Discharge: 2016-09-17 | Disposition: A | Discharge status: Home / Self Care | Payer: Medicare Other | Source: Ambulatory Visit | Attending: Internal Medicine | Admitting: Internal Medicine

## 2016-09-17 DIAGNOSIS — I2584 Coronary atherosclerosis due to calcified coronary lesion: Secondary | ICD-10-CM | POA: Diagnosis not present

## 2016-09-17 DIAGNOSIS — Z9189 Other specified personal risk factors, not elsewhere classified: Secondary | ICD-10-CM

## 2016-09-17 LAB — EXERCISE TOLERANCE TEST
CSEPED: 4 min
CSEPHR: 81 %
CSEPPHR: 116 {beats}/min
Estimated workload: 6.6 METS
Exercise duration (sec): 42 s
MPHR: 143 {beats}/min
RPE: 19
Rest HR: 58 {beats}/min

## 2016-09-17 NOTE — Progress Notes (Unsigned)
ETT reviewed by Dr. Debara Pickett and given ok to leave.

## 2016-09-18 ENCOUNTER — Telehealth: Payer: Self-pay

## 2016-09-18 DIAGNOSIS — R079 Chest pain, unspecified: Secondary | ICD-10-CM

## 2016-09-18 NOTE — Telephone Encounter (Signed)
-----   Message from Sanda Klein, MD sent at 09/17/2016  4:57 PM EDT ----- Inadequate heart rate on treadmill. Please schedule for [facility], chest pain.

## 2016-09-18 NOTE — Telephone Encounter (Signed)
lmtcb

## 2016-09-21 NOTE — Telephone Encounter (Signed)
Follow up    Pt is returning call to [facility].

## 2016-09-21 NOTE — Telephone Encounter (Signed)
Mrs. Schnepf is returning a call . Thanks

## 2016-09-21 NOTE — Telephone Encounter (Signed)
lexiscan is ordered Pt notified

## 2016-09-22 ENCOUNTER — Encounter: Payer: Self-pay | Admitting: Family Medicine

## 2016-09-28 ENCOUNTER — Telehealth: Payer: Self-pay | Admitting: Cardiovascular Disease

## 2016-09-28 NOTE — Telephone Encounter (Signed)
Returned call. Answered questions regarding exam to my best capacity. Pt understands nuclear dept nurse will call later in the week to go over procedure instructions. She voiced thanks for call.

## 2016-09-28 NOTE — Telephone Encounter (Signed)
New Message   pt verbalized that she is calling for rn   She has some worries and conserns for the nuc test that she will have   On 10/01/2016

## 2016-09-29 ENCOUNTER — Telehealth (HOSPITAL_COMMUNITY): Payer: Self-pay

## 2016-09-29 NOTE — Telephone Encounter (Signed)
Encounter complete. 

## 2016-10-01 ENCOUNTER — Ambulatory Visit (HOSPITAL_COMMUNITY)
Admission: RE | Admit: 2016-10-01 | Discharge: 2016-10-01 | Disposition: A | Discharge status: Home / Self Care | Payer: Medicare Other | Source: Ambulatory Visit | Attending: Cardiology | Admitting: Cardiology

## 2016-10-01 DIAGNOSIS — K449 Diaphragmatic hernia without obstruction or gangrene: Secondary | ICD-10-CM | POA: Insufficient documentation

## 2016-10-01 DIAGNOSIS — Z853 Personal history of malignant neoplasm of breast: Secondary | ICD-10-CM | POA: Diagnosis not present

## 2016-10-01 DIAGNOSIS — I701 Atherosclerosis of renal artery: Secondary | ICD-10-CM | POA: Diagnosis not present

## 2016-10-01 DIAGNOSIS — R079 Chest pain, unspecified: Secondary | ICD-10-CM | POA: Insufficient documentation

## 2016-10-01 DIAGNOSIS — Z87891 Personal history of nicotine dependence: Secondary | ICD-10-CM | POA: Diagnosis not present

## 2016-10-01 DIAGNOSIS — E1151 Type 2 diabetes mellitus with diabetic peripheral angiopathy without gangrene: Secondary | ICD-10-CM | POA: Insufficient documentation

## 2016-10-01 DIAGNOSIS — Z8249 Family history of ischemic heart disease and other diseases of the circulatory system: Secondary | ICD-10-CM | POA: Insufficient documentation

## 2016-10-01 DIAGNOSIS — I10 Essential (primary) hypertension: Secondary | ICD-10-CM | POA: Insufficient documentation

## 2016-10-01 DIAGNOSIS — I779 Disorder of arteries and arterioles, unspecified: Secondary | ICD-10-CM | POA: Insufficient documentation

## 2016-10-01 DIAGNOSIS — I2510 Atherosclerotic heart disease of native coronary artery without angina pectoris: Secondary | ICD-10-CM | POA: Insufficient documentation

## 2016-10-01 LAB — MYOCARDIAL PERFUSION IMAGING
CHL CUP RESTING HR STRESS: 60 {beats}/min
CSEPPHR: 85 {beats}/min
LV sys vol: 43 mL
LVDIAVOL: 99 mL (ref 46–106)
NUC STRESS TID: 1.32
SDS: 7
SRS: 0
SSS: 7

## 2016-10-01 MED ORDER — REGADENOSON 0.4 MG/5ML IV SOLN
0.4000 mg | Freq: Once | INTRAVENOUS | Status: AC
  Administered 2016-10-01: 0.4 mg via INTRAVENOUS

## 2016-10-01 MED ORDER — TECHNETIUM TC 99M TETROFOSMIN IV KIT
32.0000 | PACK | Freq: Once | INTRAVENOUS | Status: AC | PRN
  Administered 2016-10-01: 32 via INTRAVENOUS
  Filled 2016-10-01: qty 32

## 2016-10-01 MED ORDER — TECHNETIUM TC 99M TETROFOSMIN IV KIT
9.7000 | PACK | Freq: Once | INTRAVENOUS | Status: AC | PRN
  Administered 2016-10-01: 9.7 via INTRAVENOUS
  Filled 2016-10-01: qty 10

## 2016-10-06 DIAGNOSIS — E119 Type 2 diabetes mellitus without complications: Secondary | ICD-10-CM | POA: Diagnosis not present

## 2016-10-06 LAB — BASIC METABOLIC PANEL
BUN: 18 (ref 4–21)
Creatinine: 1.2 — AB (ref 0.5–1.1)
GLUCOSE: 101
POTASSIUM: 3.9 (ref 3.4–5.3)
SODIUM: 142 (ref 137–147)

## 2016-10-06 LAB — HEPATIC FUNCTION PANEL
ALK PHOS: 52 (ref 25–125)
ALT: 7 (ref 7–35)
AST: 14 (ref 13–35)
Bilirubin, Total: 0.3

## 2016-10-06 LAB — HEMOGLOBIN A1C: Hemoglobin A1C: 6.3

## 2016-10-12 DIAGNOSIS — Z833 Family history of diabetes mellitus: Secondary | ICD-10-CM | POA: Diagnosis not present

## 2016-10-12 DIAGNOSIS — E559 Vitamin D deficiency, unspecified: Secondary | ICD-10-CM | POA: Diagnosis not present

## 2016-10-12 DIAGNOSIS — M8580 Other specified disorders of bone density and structure, unspecified site: Secondary | ICD-10-CM | POA: Diagnosis not present

## 2016-10-12 DIAGNOSIS — E119 Type 2 diabetes mellitus without complications: Secondary | ICD-10-CM | POA: Diagnosis not present

## 2016-10-12 DIAGNOSIS — I10 Essential (primary) hypertension: Secondary | ICD-10-CM | POA: Diagnosis not present

## 2016-10-12 DIAGNOSIS — E7800 Pure hypercholesterolemia, unspecified: Secondary | ICD-10-CM | POA: Diagnosis not present

## 2016-10-13 ENCOUNTER — Encounter: Payer: Self-pay | Admitting: Internal Medicine

## 2016-10-27 ENCOUNTER — Telehealth: Payer: Self-pay | Admitting: Cardiovascular Disease

## 2016-10-27 NOTE — Telephone Encounter (Signed)
S/w pt she states that mail service sent her another rx that is not included in recall she will call back when refills run out for further direction.

## 2016-10-27 NOTE — Telephone Encounter (Signed)
New message    Pt c/o medication issue:  1. Name of Medication: valsartan  2. How are you currently taking this medication (dosage and times per day)? 320-25 mg  3. Are you having a reaction (difficulty breathing--STAT)? no  4. What is your medication issue? Pt is calling because her medication has been recalled and her pharmacy sent her valsartan from a different provider. Please call.

## 2016-11-06 ENCOUNTER — Telehealth: Payer: Self-pay | Admitting: Gastroenterology

## 2016-11-09 NOTE — Telephone Encounter (Signed)
Attempted to initiate PA on cover my meds. Plan could not be found. Called [facility] and they transferred me to [phone] to initiate. PA was approved for lansoprazole 30 mg 90/90 capsules until 11/09/17.

## 2016-11-09 NOTE — Telephone Encounter (Signed)
Initiated PA with cover my meds

## 2016-11-14 ENCOUNTER — Other Ambulatory Visit: Payer: Self-pay | Admitting: Gastroenterology

## 2016-11-19 DIAGNOSIS — Z853 Personal history of malignant neoplasm of breast: Secondary | ICD-10-CM | POA: Diagnosis not present

## 2016-11-19 DIAGNOSIS — R599 Enlarged lymph nodes, unspecified: Secondary | ICD-10-CM | POA: Diagnosis not present

## 2016-11-19 LAB — HM MAMMOGRAPHY

## 2016-11-25 ENCOUNTER — Encounter: Payer: Self-pay | Admitting: Internal Medicine

## 2016-12-14 ENCOUNTER — Other Ambulatory Visit: Payer: Self-pay | Admitting: Cardiovascular Disease

## 2016-12-14 NOTE — Telephone Encounter (Signed)
Rx(s) sent to pharmacy electronically.  

## 2016-12-31 ENCOUNTER — Encounter: Payer: Self-pay | Admitting: Oncology

## 2017-02-03 DIAGNOSIS — E559 Vitamin D deficiency, unspecified: Secondary | ICD-10-CM | POA: Diagnosis not present

## 2017-02-03 DIAGNOSIS — E119 Type 2 diabetes mellitus without complications: Secondary | ICD-10-CM | POA: Diagnosis not present

## 2017-02-03 DIAGNOSIS — E7800 Pure hypercholesterolemia, unspecified: Secondary | ICD-10-CM | POA: Diagnosis not present

## 2017-02-11 ENCOUNTER — Ambulatory Visit (INDEPENDENT_AMBULATORY_CARE_PROVIDER_SITE_OTHER): Payer: Medicare Other

## 2017-02-11 DIAGNOSIS — E559 Vitamin D deficiency, unspecified: Secondary | ICD-10-CM | POA: Diagnosis not present

## 2017-02-11 DIAGNOSIS — Z23 Encounter for immunization: Secondary | ICD-10-CM

## 2017-02-11 DIAGNOSIS — M8580 Other specified disorders of bone density and structure, unspecified site: Secondary | ICD-10-CM | POA: Diagnosis not present

## 2017-02-11 DIAGNOSIS — E119 Type 2 diabetes mellitus without complications: Secondary | ICD-10-CM | POA: Diagnosis not present

## 2017-02-11 DIAGNOSIS — E7800 Pure hypercholesterolemia, unspecified: Secondary | ICD-10-CM | POA: Diagnosis not present

## 2017-02-11 DIAGNOSIS — I10 Essential (primary) hypertension: Secondary | ICD-10-CM | POA: Diagnosis not present

## 2017-03-12 ENCOUNTER — Encounter: Payer: Self-pay | Admitting: Adult Health

## 2017-03-12 ENCOUNTER — Ambulatory Visit (INDEPENDENT_AMBULATORY_CARE_PROVIDER_SITE_OTHER): Payer: Medicare Other | Admitting: Adult Health

## 2017-03-12 VITALS — BP 148/70 | Temp 98.6°F | Ht 65.0 in | Wt 164.5 lb

## 2017-03-12 DIAGNOSIS — I2510 Atherosclerotic heart disease of native coronary artery without angina pectoris: Secondary | ICD-10-CM | POA: Diagnosis not present

## 2017-03-12 DIAGNOSIS — J069 Acute upper respiratory infection, unspecified: Secondary | ICD-10-CM

## 2017-03-12 DIAGNOSIS — I2584 Coronary atherosclerosis due to calcified coronary lesion: Secondary | ICD-10-CM | POA: Diagnosis not present

## 2017-03-12 MED ORDER — DOXYCYCLINE HYCLATE 100 MG PO CAPS: 100.0000 mg | ORAL_CAPSULE | Freq: Two times a day (BID) | ORAL | 0 refills | Status: DC

## 2017-03-12 NOTE — Progress Notes (Signed)
Subjective:    Patient ID: Donna May, female    DOB: [birth year], 76 y.o.   MRN: [id number]  URI   This is a recurrent problem. The current episode started 1 to 4 weeks ago (2-3 weeks ). There has been no fever. Associated symptoms include abdominal pain, congestion, coughing (productive ), rhinorrhea and wheezing. Pertinent negatives include no ear pain, headaches, sinus pain, sore throat or vomiting. Treatments tried: Mucinex  The treatment provided mild relief.   Review of Systems  Constitutional: Negative.   HENT: Positive for congestion, postnasal drip, rhinorrhea and sinus pressure. Negative for ear pain, sinus pain and sore throat.   Respiratory: Positive for cough (productive ) and wheezing.   Gastrointestinal: Positive for abdominal pain. Negative for vomiting.  Neurological: Negative for headaches.   Past Medical History:  Diagnosis Date  . Anemia   . Arthritis   . Breast cancer ([place]) 1992  . Diabetes mellitus    type 2  . Duodenitis 01/18/2002  . Fainting spell   . GERD (gastroesophageal reflux disease)   . Heart murmur   . Hiatal hernia 08/08/2008, 01/18/2002  . History of pneumonia   . Hyperlipidemia   . Hypertension   . UTI (lower urinary tract infection)     Social History   Socioeconomic History  . Marital status: Single    Spouse name: Not on file  . Number of children: 2  . Years of education: Not on file  . Highest education level: Not on file  Social Needs  . Financial resource strain: Not on file  . Food insecurity - worry: Not on file  . Food insecurity - inability: Not on file  . Transportation needs - medical: Not on file  . Transportation needs - non-medical: Not on file  Occupational History  . Occupation: retired  Tobacco Use  . Smoking status: Former Smoker    Packs/day: 0.01    Years: 10.00    Pack years: 0.10  . Smokeless tobacco: Never Used  . Tobacco comment: quit 76 yo;   Substance and Sexual Activity  . Alcohol use: Yes   Alcohol/week: 0.0 oz    Comment: occ  . Drug use: No  . Sexual activity: Not on file  Other Topics Concern  . Not on file  Social History Narrative  . Not on file    Past Surgical History:  Procedure Laterality Date  . AXILLARY SURGERY     cyst removal, right  . MASTECTOMY  1992   right, with flap  . NM MYOCAR PERF WALL MOTION  06/11/2009   Protocol:Bruce, post stress EF58%, EKG negative for ischemia, low risk  . TONSILLECTOMY    . TRANSTHORACIC ECHOCARDIOGRAM  12/24/2009   LVEF =>55%, normal study    Family History  Problem Relation Age of Onset  . Breast cancer Cousin   . Diabetes Brother   . Heart disease Mother   . Hypertension Mother   . Heart disease Father   . Hypertension Father   . Prostate cancer Brother   . Heart attack Maternal Grandmother   . Stroke Maternal Grandfather     No Known Allergies  Current Outpatient Medications on File Prior to Visit  Medication Sig Dispense Refill  . albuterol (PROVENTIL HFA;VENTOLIN HFA) 108 (90 Base) MCG/ACT inhaler Inhale 1-2 puffs into the lungs every 6 (six) hours as needed for wheezing or shortness of breath. 1 Inhaler 0  . AMBULATORY NON FORMULARY MEDICATION Place 0.0125 g rectally 2 (two) times  daily. Medication Name: Nitroglycerin ointment .5462. Apply 2-3 times daily as needed. 1 Tube 1  . amLODipine (NORVASC) 10 MG tablet Take 1 tablet (10 mg total) by mouth daily. 90 tablet 2  . aspirin 81 MG tablet Take 81 mg by mouth daily.    Marland Kitchen CALCIUM-MAGNESIUM-VITAMIN D PO Take 1 tablet by mouth daily.    . Cholecalciferol (VITAMIN D3) 2000 UNITS TABS Take 1 tablet by mouth daily.    . CRESTOR 20 MG tablet Take 1 tablet (20 mg total) by mouth daily. 90 tablet 0  . Echinacea 400 MG CAPS Take 1 capsule by mouth daily.    Marland Kitchen JANUVIA 100 MG tablet Take 100 mg by mouth daily.     . lansoprazole (PREVACID) 30 MG capsule TAKE 1 CAPSULE DAILY BEFOREBREAKFAST 90 capsule 0  . metFORMIN (GLUCOPHAGE-XR) 500 MG 24 hr tablet Take 500 mg  by mouth 2 (two) times daily before a meal.     . metoprolol succinate (TOPROL-XL) 100 MG 24 hr tablet TAKE 1 TABLET (100 MG TOTAL) BY MOUTH DAILY. TAKE WITH OR IMMEDIATELY FOLLOWING A MEAL. 90 tablet 1  . Multiple Vitamins-Minerals (CENTRUM SILVER ULTRA WOMENS) TABS Take 1 tablet by mouth daily.    . Omega 3 1000 MG CAPS Take 1 capsule by mouth daily.    . raloxifene (EVISTA) 60 MG tablet Take 1 tablet (60 mg total) by mouth daily. 90 tablet 1  . rosuvastatin (CRESTOR) 20 MG tablet TAKE 1 TABLET DAILY PLEASE CALL OFFICE FOR FURTHER    REFILLS 90 tablet 0  . valsartan-hydrochlorothiazide (DIOVAN-HCT) 320-25 MG tablet Take 1 tablet by mouth daily. 90 tablet 4   Current Facility-Administered Medications on File Prior to Visit  Medication Dose Route Frequency Provider Last Rate Last Dose  . triamcinolone acetonide (KENALOG) 10 MG/ML injection 10 mg  10 mg Other Once Harriet Masson, DPM        BP (!) 148/70 (Patient Position: Sitting, Cuff Size: Large)   Temp 98.6 F (37 C) (Oral)   Ht 5\' 5"  (1.651 m)   Wt 164 lb 8 oz (74.6 kg)   BMI 27.37 kg/m       Objective:   Physical Exam  Constitutional: She is oriented to person, place, and time. She appears well-developed and well-nourished. No distress.  HENT:  Head: Normocephalic and atraumatic.  Right Ear: Hearing, tympanic membrane, external ear and ear canal normal.  Left Ear: Hearing, tympanic membrane, external ear and ear canal normal.  Nose: Mucosal edema and rhinorrhea present. Right sinus exhibits frontal sinus tenderness. Right sinus exhibits no maxillary sinus tenderness. Left sinus exhibits frontal sinus tenderness. Left sinus exhibits no maxillary sinus tenderness.  Mouth/Throat: Uvula is midline, oropharynx is clear and moist and mucous membranes are normal.  Neck: Normal range of motion. Neck supple.  Cardiovascular: Normal rate, regular rhythm, normal heart sounds and intact distal pulses. Exam reveals no gallop and no friction  rub.  No murmur heard. Pulmonary/Chest: Effort normal and breath sounds normal. No respiratory distress. She has no wheezes. She has no rales. She exhibits no tenderness.  Lymphadenopathy:    She has no cervical adenopathy.  Neurological: She is alert and oriented to person, place, and time.  Skin: Skin is warm and dry. No rash noted. She is not diaphoretic. No erythema. No pallor.  Psychiatric: She has a normal mood and affect. Her behavior is normal. Thought content normal.  Nursing note and vitals reviewed.     Assessment & Plan:  1.  Upper respiratory tract infection, unspecified type - doxycycline (VIBRAMYCIN) 100 MG capsule; Take 1 capsule (100 mg total) by mouth 2 (two) times daily.  Dispense: 14 capsule; Refill: 0 - Continue with Mucinex - Stay hydrated and rest   Dorothyann Peng, NP

## 2017-03-13 ENCOUNTER — Other Ambulatory Visit: Payer: Self-pay | Admitting: Cardiovascular Disease

## 2017-03-24 ENCOUNTER — Encounter: Payer: Medicare Other | Admitting: Obstetrics & Gynecology

## 2017-03-26 ENCOUNTER — Telehealth: Payer: Self-pay | Admitting: Cardiovascular Disease

## 2017-03-26 MED ORDER — VALSARTAN-HYDROCHLOROTHIAZIDE 320-25 MG PO TABS: 1.0000 | ORAL_TABLET | Freq: Every day | ORAL | 3 refills | Status: DC

## 2017-03-26 NOTE — Telephone Encounter (Signed)
Returned call to patient she stated she is taking valsartan/hctz.She wanted to make sure it is safe to take.She was told it was recalled.Advised I will send message to our pharmacist for advice.

## 2017-03-26 NOTE — Telephone Encounter (Signed)
Please contact dispensing pharmacy to determine batch in hand is affected by recall.   If not affected by recall is okay to continue taking

## 2017-03-26 NOTE — Telephone Encounter (Signed)
Returned call to patient she stated [facility] advised Valsartan/Hctz not recalled.Refill sent to pharmacy.

## 2017-03-26 NOTE — Telephone Encounter (Signed)
Please call,she wants to know if Dr C is going to change her Valsartan>Please call and let her know.

## 2017-04-12 ENCOUNTER — Other Ambulatory Visit: Payer: Self-pay | Admitting: Obstetrics & Gynecology

## 2017-04-12 ENCOUNTER — Other Ambulatory Visit: Payer: Self-pay | Admitting: Gastroenterology

## 2017-04-12 ENCOUNTER — Telehealth: Payer: Self-pay | Admitting: *Deleted

## 2017-04-12 NOTE — Telephone Encounter (Signed)
annual scheduled on 04/26/17

## 2017-04-12 NOTE — Telephone Encounter (Signed)
LM for pt that she needs to schedule an OV before we can refill scripts. She hasn't been seen since 04-2015.

## 2017-04-12 NOTE — Telephone Encounter (Signed)
Error

## 2017-04-15 ENCOUNTER — Telehealth: Payer: Self-pay | Admitting: Gastroenterology

## 2017-04-15 ENCOUNTER — Other Ambulatory Visit: Payer: Self-pay

## 2017-04-15 MED ORDER — LANSOPRAZOLE 30 MG PO CPDR: 30.0000 mg | DELAYED_RELEASE_CAPSULE | Freq: Every day | ORAL | 0 refills | Status: DC

## 2017-04-15 NOTE — Telephone Encounter (Signed)
Script sent  

## 2017-04-15 NOTE — Telephone Encounter (Signed)
Patient states she needs medication prevacid refilled at [facility] until ov with Dr.[name] on 3.12.19

## 2017-04-26 ENCOUNTER — Ambulatory Visit (INDEPENDENT_AMBULATORY_CARE_PROVIDER_SITE_OTHER): Payer: Medicare Other | Admitting: Internal Medicine

## 2017-04-26 ENCOUNTER — Encounter: Payer: Self-pay | Admitting: Internal Medicine

## 2017-04-26 ENCOUNTER — Ambulatory Visit (INDEPENDENT_AMBULATORY_CARE_PROVIDER_SITE_OTHER): Payer: Medicare Other | Admitting: Obstetrics & Gynecology

## 2017-04-26 ENCOUNTER — Encounter: Payer: Self-pay | Admitting: Obstetrics & Gynecology

## 2017-04-26 VITALS — BP 124/78 | Ht 65.0 in | Wt 169.4 lb

## 2017-04-26 VITALS — BP 130/84

## 2017-04-26 DIAGNOSIS — I701 Atherosclerosis of renal artery: Secondary | ICD-10-CM

## 2017-04-26 DIAGNOSIS — E1121 Type 2 diabetes mellitus with diabetic nephropathy: Secondary | ICD-10-CM

## 2017-04-26 DIAGNOSIS — I2510 Atherosclerotic heart disease of native coronary artery without angina pectoris: Secondary | ICD-10-CM

## 2017-04-26 DIAGNOSIS — E7800 Pure hypercholesterolemia, unspecified: Secondary | ICD-10-CM

## 2017-04-26 DIAGNOSIS — Z853 Personal history of malignant neoplasm of breast: Secondary | ICD-10-CM

## 2017-04-26 DIAGNOSIS — I2584 Coronary atherosclerosis due to calcified coronary lesion: Secondary | ICD-10-CM

## 2017-04-26 DIAGNOSIS — N39490 Overflow incontinence: Secondary | ICD-10-CM

## 2017-04-26 DIAGNOSIS — N644 Mastodynia: Secondary | ICD-10-CM

## 2017-04-26 DIAGNOSIS — Z124 Encounter for screening for malignant neoplasm of cervix: Secondary | ICD-10-CM | POA: Diagnosis not present

## 2017-04-26 DIAGNOSIS — Z780 Asymptomatic menopausal state: Secondary | ICD-10-CM

## 2017-04-26 DIAGNOSIS — I10 Essential (primary) hypertension: Secondary | ICD-10-CM

## 2017-04-26 DIAGNOSIS — Z01411 Encounter for gynecological examination (general) (routine) with abnormal findings: Secondary | ICD-10-CM

## 2017-04-26 DIAGNOSIS — D0511 Intraductal carcinoma in situ of right breast: Secondary | ICD-10-CM

## 2017-04-26 LAB — COMPREHENSIVE METABOLIC PANEL
ALK PHOS: 49 U/L (ref 39–117)
ALT: 11 U/L (ref 0–35)
AST: 14 U/L (ref 0–37)
Albumin: 4 g/dL (ref 3.5–5.2)
BILIRUBIN TOTAL: 0.4 mg/dL (ref 0.2–1.2)
BUN: 21 mg/dL (ref 6–23)
CALCIUM: 9.2 mg/dL (ref 8.4–10.5)
CO2: 29 mEq/L (ref 19–32)
CREATININE: 1.2 mg/dL (ref 0.40–1.20)
Chloride: 104 mEq/L (ref 96–112)
GFR: 55.84 mL/min — AB (ref >60.00)
GLUCOSE: 97 mg/dL (ref 70–99)
Potassium: 3.9 mEq/L (ref 3.5–5.1)
Sodium: 142 mEq/L (ref 135–145)
TOTAL PROTEIN: 6.8 g/dL (ref 6.0–8.3)

## 2017-04-26 LAB — MICROALBUMIN / CREATININE URINE RATIO
CREATININE, U: 113.9 mg/dL
MICROALB UR: 1.3 mg/dL (ref 0.0–1.9)
Microalb Creat Ratio: 1.1 mg/g (ref 0.0–30.0)

## 2017-04-26 LAB — CBC WITH DIFFERENTIAL/PLATELET
BASOS ABS: 23 {cells}/uL (ref 0–200)
BASOS PCT: 0.4 %
EOS ABS: 200 {cells}/uL (ref 15–500)
Eosinophils Relative: 3.5 %
HCT: 39.2 % (ref 35.0–45.0)
HEMOGLOBIN: 12.9 g/dL (ref 11.7–15.5)
Lymphs Abs: 2314 cells/uL (ref 850–3900)
MCH: 28.3 pg (ref 27.0–33.0)
MCHC: 32.9 g/dL (ref 32.0–36.0)
MCV: 86 fL (ref 80.0–100.0)
MONOS PCT: 8.1 %
MPV: 11.2 fL (ref 7.5–12.5)
NEUTROS ABS: 2702 {cells}/uL (ref 1500–7800)
Neutrophils Relative %: 47.4 %
Platelets: 235 10*3/uL (ref 140–400)
RBC: 4.56 10*6/uL (ref 3.80–5.10)
RDW: 14 % (ref 11.0–15.0)
Total Lymphocyte: 40.6 %
WBC mixed population: 462 cells/uL (ref 200–950)
WBC: 5.7 10*3/uL (ref 3.8–10.8)

## 2017-04-26 LAB — LIPID PANEL
Cholesterol: 210 mg/dL — ABNORMAL HIGH (ref 0–200)
HDL: 62.2 mg/dL (ref >39.00)
LDL Cholesterol: 136 mg/dL — ABNORMAL HIGH (ref 0–99)
NonHDL: 147.59
TRIGLYCERIDES: 56 mg/dL (ref 0.0–149.0)
Total CHOL/HDL Ratio: 3
VLDL: 11.2 mg/dL (ref 0.0–40.0)

## 2017-04-26 LAB — HEMOGLOBIN A1C: Hgb A1c MFr Bld: 6.5 % (ref 4.6–6.5)

## 2017-04-26 LAB — TSH: TSH: 1.48 u[IU]/mL (ref 0.35–4.50)

## 2017-04-26 NOTE — Progress Notes (Signed)
Subjective:    Patient ID: Donna May, female    DOB: Jul 20, 1940, 77 y.o.   MRN: [id number]  HPI  77 year old patient who is seen today for follow-up   and for subsequent Medicare wellness visit  Medical problems include essential hypertension.  She has type 2 diabetes as well as remote history of right breast cancer.  She has chronic right arm lymphedema.  She has been followed by endocrinology in the past. She has gastroesophageal reflux disease and remains on PPI therapy.  Past Medical History:  Diagnosis Date  . Anemia   . Arthritis   . Breast cancer ([place]) 1992  . Diabetes mellitus    type 2  . Duodenitis 01/18/2002  . Fainting spell   . GERD (gastroesophageal reflux disease)   . Heart murmur   . Hiatal hernia 08/08/2008, 01/18/2002  . History of pneumonia   . Hyperlipidemia   . Hypertension   . Lymphedema 2017   Right arm  . UTI (lower urinary tract infection)      Social History   Socioeconomic History  . Marital status: Single    Spouse name: Not on file  . Number of children: 2  . Years of education: Not on file  . Highest education level: Not on file  Social Needs  . Financial resource strain: Not on file  . Food insecurity - worry: Not on file  . Food insecurity - inability: Not on file  . Transportation needs - medical: Not on file  . Transportation needs - non-medical: Not on file  Occupational History  . Occupation: retired  Tobacco Use  . Smoking status: Former Smoker    Packs/day: 0.01    Years: 10.00    Pack years: 0.10  . Smokeless tobacco: Never Used  . Tobacco comment: quit 77 yo;   Substance and Sexual Activity  . Alcohol use: Yes    Alcohol/week: 0.0 oz    Comment: occ  . Drug use: No  . Sexual activity: Yes    Partners: Male    Birth control/protection: Condom    Comment: First sexual encounter age 39. Fewer than 5 partners in life time.  Other Topics Concern  . Not on file  Social History Narrative  . Not on file    Past  Surgical History:  Procedure Laterality Date  . AXILLARY SURGERY     cyst removal, right  . MASTECTOMY  1992   right, with flap  . NM MYOCAR PERF WALL MOTION  06/11/2009   Protocol:[place], post stress EF58%, EKG negative for ischemia, low risk  . TONSILLECTOMY    . TRANSTHORACIC ECHOCARDIOGRAM  12/24/2009   LVEF =>55%, normal study    Family History  Problem Relation Age of Onset  . Breast cancer Cousin   . Diabetes Brother   . Heart disease Mother   . Hypertension Mother   . Heart disease Father   . Hypertension Father   . Prostate cancer Brother   . Heart attack Maternal Grandmother   . Stroke Maternal Grandfather     No Known Allergies  Current Outpatient Medications on File Prior to Visit  Medication Sig Dispense Refill  . amLODipine (NORVASC) 10 MG tablet Take 1 tablet (10 mg total) by mouth daily. 90 tablet 2  . aspirin 81 MG tablet Take 81 mg by mouth daily.    Marland Kitchen CALCIUM-MAGNESIUM-VITAMIN D PO Take 1 tablet by mouth daily.    . Cholecalciferol (VITAMIN D3) 2000 UNITS TABS Take 1 tablet  by mouth daily.    . CRESTOR 20 MG tablet Take 1 tablet (20 mg total) by mouth daily. 90 tablet 0  . Echinacea 400 MG CAPS Take 1 capsule by mouth daily.    Marland Kitchen JANUVIA 100 MG tablet Take 100 mg by mouth daily.     . lansoprazole (PREVACID) 30 MG capsule Take 1 capsule (30 mg total) by mouth daily before breakfast. 90 capsule 0  . metFORMIN (GLUCOPHAGE-XR) 500 MG 24 hr tablet Take 500 mg by mouth 2 (two) times daily before a meal.     . metoprolol succinate (TOPROL-XL) 100 MG 24 hr tablet TAKE 1 TABLET (100 MG TOTAL) BY MOUTH DAILY. TAKE WITH OR IMMEDIATELY FOLLOWING A MEAL. 90 tablet 1  . Multiple Vitamins-Minerals (CENTRUM SILVER ULTRA WOMENS) TABS Take 1 tablet by mouth daily.    . Omega 3 1000 MG CAPS Take 1 capsule by mouth daily.    . raloxifene (EVISTA) 60 MG tablet TAKE 1 TABLET DAILY 90 tablet 0  . valsartan-hydrochlorothiazide (DIOVAN-HCT) 320-25 MG tablet Take 1 tablet by mouth  daily. 90 tablet 3  . albuterol (PROVENTIL HFA;VENTOLIN HFA) 108 (90 Base) MCG/ACT inhaler Inhale 1-2 puffs into the lungs every 6 (six) hours as needed for wheezing or shortness of breath. 1 Inhaler 0  . AMBULATORY NON FORMULARY MEDICATION Place 0.0125 g rectally 2 (two) times daily. Medication Name: Nitroglycerin ointment .0865. Apply 2-3 times daily as needed. 1 Tube 1   Current Facility-Administered Medications on File Prior to Visit  Medication Dose Route Frequency Provider Last Rate Last Dose  . triamcinolone acetonide (KENALOG) 10 MG/ML injection 10 mg  10 mg Other Once Harriet Masson, DPM        1. Risk factors, based on past  M,S,F history.  Cardiovascular risk factors include a history of diabetes hypertension and dyslipidemia.  She has a history of coronary atherosclerosis  2.  Physical activities: Occasional walking but no regular exercise regimen  3.  Depression/mood: No history of major depression   4.  Hearing: no major deficits but does have difficulty understanding speakers at church and following a conversation in a noisy room.  She does have some occasional tinnitus   5.  ADL's: Independent  6.  Fall risk: Low 7.  Home safety: No problems identified  8.  Height weight, and visual acuity; height and weight stable no change in visual acuity  9.  Counseling: Weight loss encouraged; more regular exercise regimen discussed  10. Lab orders based on risk factors: Laboratory update including lipid profile and urine for microalbumin will be reviewed.  Will review hemoglobin A1c  11. Referral : None appropriate at this time  12. Care plan:  Continue efforts at aggressive risk factor modification 13. Cognitive assessment: Alert and oriented with normal affect.  No cognitive dysfunction  14. Screening: Patient provided with a written and personalized 5-10 year screening schedule in the AVS.    15. Provider List Update: Primary care radiology and ophthalmology as well as  endocrinology      Review of Systems  Constitutional: Negative.   HENT: Negative for congestion, dental problem, hearing loss, rhinorrhea, sinus pressure, sore throat and tinnitus.   Eyes: Negative for pain, discharge and visual disturbance.  Respiratory: Negative for cough and shortness of breath.   Cardiovascular: Negative for chest pain, palpitations and leg swelling.  Gastrointestinal: Negative for abdominal distention, abdominal pain, blood in stool, constipation, diarrhea, nausea and vomiting.  Genitourinary: Negative for difficulty urinating, dysuria, flank pain, frequency,  hematuria, pelvic pain, urgency, vaginal bleeding, vaginal discharge and vaginal pain.  Musculoskeletal: Positive for arthralgias and gait problem. Negative for joint swelling.       Chronic knee pain  Skin: Negative for rash.  Neurological: Negative for dizziness, syncope, speech difficulty, weakness, numbness and headaches.  Hematological: Negative for adenopathy.  Psychiatric/Behavioral: Negative for agitation, behavioral problems and dysphoric mood. The patient is not nervous/anxious.        Objective:   Physical Exam  Constitutional: She is oriented to person, place, and time. She appears well-developed and well-nourished.  HENT:  Head: Normocephalic.  Right Ear: External ear normal.  Left Ear: External ear normal.  Mouth/Throat: Oropharynx is clear and moist.  Eyes: Conjunctivae and EOM are normal. Pupils are equal, round, and reactive to light.  Neck: Normal range of motion. Neck supple. No thyromegaly present.  Cardiovascular: Normal rate, regular rhythm and normal heart sounds.  Full right dorsalis pedis pulse only  Pulmonary/Chest: Effort normal and breath sounds normal.  Abdominal: Soft. Bowel sounds are normal. She exhibits no mass. There is no tenderness.  Lower transverse surgical scar  Musculoskeletal: Normal range of motion. She exhibits edema.  Chronic lymphedema right arm    Lymphadenopathy:    She has no cervical adenopathy.  Neurological: She is alert and oriented to person, place, and time.  Skin: Skin is warm and dry. No rash noted.  Psychiatric: She has a normal mood and affect. Her behavior is normal.          Assessment & Plan:   Diabetes mellitus type 2 will review hemoglobin A1c.  Lipid profile and urine for microalbumin Essential hypertension stable dyslipidemia continue statin therapy  Subsequent Medicare wellness visit  Follow-up 3-6 months  Estefano Victory Pilar Plate

## 2017-04-26 NOTE — Patient Instructions (Signed)
Please see your eye doctor yearly to check for diabetic eye damage  Limit your sodium (Salt) intake   Please check your hemoglobin A1c every 3-6  Months    It is important that you exercise regularly, at least 20 minutes 3 to 4 times per week.  If you develop chest pain or shortness of breath seek  medical attention.  Return in 6 months for follow-up

## 2017-04-26 NOTE — Progress Notes (Signed)
Donna May May 04, 1940 [id number]   History:    77 y.o. G2P2L2    RP:  Established patient presenting for annual gyn exam   HPI: Menopause, well on no HRT.  H/O Rt breast Cancer post Rt Mastectomy with flap in 1992.  C/O Left breast tenderness/pain in the upper outer quadrant.  Last screening mammo benign 11/2016.  No PMB.  No pelvic pain.  Rarely sexually active, uses condoms.  Urine/BMs normal.  Occasional urgency/incontinence when bladder overfills.  Body mass index 28.19    Past medical history,surgical history, family history and social history were all reviewed and documented in the EPIC chart.  Gynecologic History No LMP recorded. Patient is postmenopausal. Contraception: post menopausal status Last Pap: Per patient, Pap done last year, normal Last mammogram: 11/2016. Results were: Benign Bone Density: Per patient, Bone Density done in 2017.  Will verify and let me know if needs an order to repeat it at [facility] Colonoscopy: 2010, will repeat in 2020  Obstetric History OB History  Gravida Para Term Preterm AB Living            2  SAB TAB Ectopic Multiple Live Births                    ROS: A ROS was performed and pertinent positives and negatives are included in the history.  GENERAL: No fevers or chills. HEENT: No change in vision, no earache, sore throat or sinus congestion. NECK: No pain or stiffness. CARDIOVASCULAR: No chest pain or pressure. No palpitations. PULMONARY: No shortness of breath, cough or wheeze. GASTROINTESTINAL: No abdominal pain, nausea, vomiting or diarrhea, melena or bright red blood per rectum. GENITOURINARY: No urinary frequency, urgency, hesitancy or dysuria. MUSCULOSKELETAL: No joint or muscle pain, no back pain, no recent trauma. DERMATOLOGIC: No rash, no itching, no lesions. ENDOCRINE: No polyuria, polydipsia, no heat or cold intolerance. No recent change in weight. HEMATOLOGICAL: No anemia or easy bruising or bleeding. NEUROLOGIC: No headache,  seizures, numbness, tingling or weakness. PSYCHIATRIC: No depression, no loss of interest in normal activity or change in sleep pattern.     Exam:   BP 124/78 (BP Location: Left Arm, Patient Position: Sitting, Cuff Size: Large)   Ht 5\' 5"  (1.651 m)   Wt 169 lb 6.4 oz (76.8 kg)   BMI 28.19 kg/m   Body mass index is 28.19 kg/m.  General appearance : Well developed well nourished female. No acute distress HEENT: Eyes: no retinal hemorrhage or exudates,  Neck supple, trachea midline, no carotid bruits, no thyroidmegaly Lungs: Clear to auscultation, no rhonchi or wheezes, or rib retractions  Heart: Regular rate and rhythm, no murmurs or gallops Breast:Examined in sitting and supine position.  Right breast Mastectomy with a flap.  Left breast tender in upper, outer quadrant, but no nodule/mass felt.  No skin retraction, no nipple inversion, no nipple discharge, no skin discoloration, no axillary or supraclavicular lymphadenopathy Abdomen: no palpable masses or tenderness, no rebound or guarding Extremities: no edema or skin discoloration or tenderness  Pelvic: Vulva: Normal             Vagina: No gross lesions or discharge  Cervix: No gross lesions or discharge.  Pap reflex done.  Uterus  AV, normal size, shape and consistency, non-tender and mobile  Adnexa  Without masses or tenderness  Anus: Normal   Assessment/Plan:  77 y.o. female for annual exam   1. Encounter for gynecological examination with abnormal finding Gynecologic exam in  menopause.  Pap reflex done.  Breast exam with tenderness in the left upper outer quadrant, status post right mastectomy with flap.  Last screening mammogram benign in September 2018.  Will do health labs with family physician.  Due for colonoscopy in 2020.  2. Menopause present Well on no hormone replacement therapy.  No postmenopausal bleeding.  Vitamin D supplements, calcium rich nutrition and regular weightbearing physical activity recommended.  Will  verify if due for bone density and wants to do it at [facility].  3. Ductal carcinoma in situ (DCIS) of right breast Status post right mastectomy.   4. Breast pain, left Left upper outer quadrant tenderness and pain.  Will proceed with left diagnostic mammogram and breast ultrasound.  5. Overflow incontinence of urine Recommend regular micturitions to avoid over filling of bladder.  Kegel exercises recommended regularly.  6. Screening for malignant neoplasm of cervix  - Pap IG w/ reflex to HPV when ASC-U  Counseling on above issues more than 50% for 15 minutes.  [name] MD, 4:04 PM 04/26/2017

## 2017-04-27 ENCOUNTER — Telehealth: Payer: Self-pay | Admitting: *Deleted

## 2017-04-27 ENCOUNTER — Encounter: Payer: Self-pay | Admitting: Obstetrics & Gynecology

## 2017-04-27 NOTE — Telephone Encounter (Signed)
-----   Message from [name], MD sent at 04/26/2017  4:28 PM EST ----- Regarding: Left diagnostic Mammo/Breast US Left upper outer breast pain/tenderness.  H/O Rt Breast ductal Ca in situ.

## 2017-04-27 NOTE — Telephone Encounter (Signed)
Patient scheduled on 04/29/17 @ 10:30am at [place]. Left message for pt to call.

## 2017-04-27 NOTE — Telephone Encounter (Signed)
Pt informed, order faxed to [facility]

## 2017-04-27 NOTE — Patient Instructions (Signed)
1. Encounter for gynecological examination with abnormal finding Gynecologic exam in menopause.  Pap reflex done.  Breast exam with tenderness in the left upper outer quadrant, status post right mastectomy with flap.  Last screening mammogram benign in September 2018.  Will do health labs with family physician.  Due for colonoscopy in 2020.  2. Menopause present Well on no hormone replacement therapy.  No postmenopausal bleeding.  Vitamin D supplements, calcium rich nutrition and regular weightbearing physical activity recommended.  Will verify if due for bone density and wants to do it at [facility].  3. Ductal carcinoma in situ (DCIS) of right breast Status post right mastectomy.   4. Breast pain, left Left upper outer quadrant tenderness and pain.  Will proceed with left diagnostic mammogram and breast ultrasound.  5. Overflow incontinence of urine Recommend regular micturitions to avoid over filling of bladder.  Kegel exercises recommended regularly.  6. Screening for malignant neoplasm of cervix  - Pap IG w/ reflex to HPV when [facility]   Normal, it was a pleasure seeing you today!  I will inform you of your results as soon as they are available.   Health Maintenance for Postmenopausal Women Menopause is a normal process in which your reproductive ability comes to an end. This process happens gradually over a span of months to years, usually between the ages of 57 and 18. Menopause is complete when you have missed 12 consecutive menstrual periods. It is important to talk with your health care provider about some of the most common conditions that affect postmenopausal women, such as heart disease, cancer, and bone loss (osteoporosis). Adopting a healthy lifestyle and getting preventive care can help to promote your health and wellness. Those actions can also lower your chances of developing some of these common conditions. What should I know about menopause? During menopause, you may  experience a number of symptoms, such as:  Moderate-to-severe hot flashes.  Night sweats.  Decrease in sex drive.  Mood swings.  Headaches.  Tiredness.  Irritability.  Memory problems.  Insomnia.  Choosing to treat or not to treat menopausal changes is an individual decision that you make with your health care provider. What should I know about hormone replacement therapy and supplements? Hormone therapy products are effective for treating symptoms that are associated with menopause, such as hot flashes and night sweats. Hormone replacement carries certain risks, especially as you become older. If you are thinking about using estrogen or estrogen with progestin treatments, discuss the benefits and risks with your health care provider. What should I know about heart disease and stroke? Heart disease, heart attack, and stroke become more likely as you age. This may be due, in part, to the hormonal changes that your body experiences during menopause. These can affect how your body processes dietary fats, triglycerides, and cholesterol. Heart attack and stroke are both medical emergencies. There are many things that you can do to help prevent heart disease and stroke:  Have your blood pressure checked at least every 1-2 years. High blood pressure causes heart disease and increases the risk of stroke.  If you are 67-62 years old, ask your health care provider if you should take aspirin to prevent a heart attack or a stroke.  Do not use any tobacco products, including cigarettes, chewing tobacco, or electronic cigarettes. If you need help quitting, ask your health care provider.  It is important to eat a healthy diet and maintain a healthy weight. ? Be sure to include plenty of  vegetables, fruits, low-fat dairy products, and lean protein. ? Avoid eating foods that are high in solid fats, added sugars, or salt (sodium).  Get regular exercise. This is one of the most important things  that you can do for your health. ? Try to exercise for at least 150 minutes each week. The type of exercise that you do should increase your heart rate and make you sweat. This is known as moderate-intensity exercise. ? Try to do strengthening exercises at least twice each week. Do these in addition to the moderate-intensity exercise.  Know your numbers.Ask your health care provider to check your cholesterol and your blood glucose. Continue to have your blood tested as directed by your health care provider.  What should I know about cancer screening? There are several types of cancer. Take the following steps to reduce your risk and to catch any cancer development as early as possible. Breast Cancer  Practice breast self-awareness. ? This means understanding how your breasts normally appear and feel. ? It also means doing regular breast self-exams. Let your health care provider know about any changes, no matter how small.  If you are 76 or older, have a clinician do a breast exam (clinical breast exam or CBE) every year. Depending on your age, family history, and medical history, it may be recommended that you also have a yearly breast X-ray (mammogram).  If you have a family history of breast cancer, talk with your health care provider about genetic screening.  If you are at high risk for breast cancer, talk with your health care provider about having an MRI and a mammogram every year.  Breast cancer (BRCA) gene test is recommended for women who have family members with BRCA-related cancers. Results of the assessment will determine the need for genetic counseling and BRCA1 and for BRCA2 testing. BRCA-related cancers include these types: ? Breast. This occurs in males or females. ? Ovarian. ? Tubal. This may also be called fallopian tube cancer. ? Cancer of the abdominal or pelvic lining (peritoneal cancer). ? Prostate. ? Pancreatic.  Cervical, Uterine, and Ovarian Cancer Your health  care provider may recommend that you be screened regularly for cancer of the pelvic organs. These include your ovaries, uterus, and vagina. This screening involves a pelvic exam, which includes checking for microscopic changes to the surface of your cervix (Pap test).  For women ages 21-65, health care providers may recommend a pelvic exam and a Pap test every three years. For women ages 7-65, they may recommend the Pap test and pelvic exam, combined with testing for human papilloma virus (HPV), every five years. Some types of HPV increase your risk of cervical cancer. Testing for HPV may also be done on women of any age who have unclear Pap test results.  Other health care providers may not recommend any screening for nonpregnant women who are considered low risk for pelvic cancer and have no symptoms. Ask your health care provider if a screening pelvic exam is right for you.  If you have had past treatment for cervical cancer or a condition that could lead to cancer, you need Pap tests and screening for cancer for at least 20 years after your treatment. If Pap tests have been discontinued for you, your risk factors (such as having a new sexual partner) need to be reassessed to determine if you should start having screenings again. Some women have medical problems that increase the chance of getting cervical cancer. In these cases, your health care  provider may recommend that you have screening and Pap tests more often.  If you have a family history of uterine cancer or ovarian cancer, talk with your health care provider about genetic screening.  If you have vaginal bleeding after reaching menopause, tell your health care provider.  There are currently no reliable tests available to screen for ovarian cancer.  Lung Cancer Lung cancer screening is recommended for adults 82-78 years old who are at high risk for lung cancer because of a history of smoking. A yearly low-dose CT scan of the lungs is  recommended if you:  Currently smoke.  Have a history of at least 30 pack-years of smoking and you currently smoke or have quit within the past 15 years. A pack-year is smoking an average of one pack of cigarettes per day for one year.  Yearly screening should:  Continue until it has been 15 years since you quit.  Stop if you develop a health problem that would prevent you from having lung cancer treatment.  Colorectal Cancer  This type of cancer can be detected and can often be prevented.  Routine colorectal cancer screening usually begins at age 70 and continues through age 7.  If you have risk factors for colon cancer, your health care provider may recommend that you be screened at an earlier age.  If you have a family history of colorectal cancer, talk with your health care provider about genetic screening.  Your health care provider may also recommend using home test kits to check for hidden blood in your stool.  A small camera at the end of a tube can be used to examine your colon directly (sigmoidoscopy or colonoscopy). This is done to check for the earliest forms of colorectal cancer.  Direct examination of the colon should be repeated every 5-10 years until age 31. However, if early forms of precancerous polyps or small growths are found or if you have a family history or genetic risk for colorectal cancer, you may need to be screened more often.  Skin Cancer  Check your skin from head to toe regularly.  Monitor any moles. Be sure to tell your health care provider: ? About any new moles or changes in moles, especially if there is a change in a mole's shape or color. ? If you have a mole that is larger than the size of a pencil eraser.  If any of your family members has a history of skin cancer, especially at a young age, talk with your health care provider about genetic screening.  Always use sunscreen. Apply sunscreen liberally and repeatedly throughout the  day.  Whenever you are outside, protect yourself by wearing long sleeves, pants, a wide-brimmed hat, and sunglasses.  What should I know about osteoporosis? Osteoporosis is a condition in which bone destruction happens more quickly than new bone creation. After menopause, you may be at an increased risk for osteoporosis. To help prevent osteoporosis or the bone fractures that can happen because of osteoporosis, the following is recommended:  If you are 58-47 years old, get at least 1,000 mg of calcium and at least 600 mg of vitamin D per day.  If you are older than age 21 but younger than age 51, get at least 1,200 mg of calcium and at least 600 mg of vitamin D per day.  If you are older than age 64, get at least 1,200 mg of calcium and at least 800 mg of vitamin D per day.  Smoking and  excessive alcohol intake increase the risk of osteoporosis. Eat foods that are rich in calcium and vitamin D, and do weight-bearing exercises several times each week as directed by your health care provider. What should I know about how menopause affects my mental health? Depression may occur at any age, but it is more common as you become older. Common symptoms of depression include:  Low or sad mood.  Changes in sleep patterns.  Changes in appetite or eating patterns.  Feeling an overall lack of motivation or enjoyment of activities that you previously enjoyed.  Frequent crying spells.  Talk with your health care provider if you think that you are experiencing depression. What should I know about immunizations? It is important that you get and maintain your immunizations. These include:  Tetanus, diphtheria, and pertussis (Tdap) booster vaccine.  Influenza every year before the flu season begins.  Pneumonia vaccine.  Shingles vaccine.  Your health care provider may also recommend other immunizations. This information is not intended to replace advice given to you by your health care provider.  Make sure you discuss any questions you have with your health care provider. Document Released: 04/24/2005 Document Revised: 09/20/2015 Document Reviewed: 12/04/2014 Elsevier Interactive Patient Education  2018 [employer].

## 2017-04-29 DIAGNOSIS — N644 Mastodynia: Secondary | ICD-10-CM | POA: Diagnosis not present

## 2017-04-29 DIAGNOSIS — Z853 Personal history of malignant neoplasm of breast: Secondary | ICD-10-CM | POA: Diagnosis not present

## 2017-04-29 LAB — PAP IG W/ RFLX HPV ASCU

## 2017-05-04 ENCOUNTER — Telehealth: Payer: Self-pay

## 2017-05-04 ENCOUNTER — Other Ambulatory Visit: Payer: Self-pay

## 2017-05-04 ENCOUNTER — Ambulatory Visit (INDEPENDENT_AMBULATORY_CARE_PROVIDER_SITE_OTHER): Payer: Medicare Other

## 2017-05-04 VITALS — BP 120/70 | HR 63 | Ht 65.0 in | Wt 175.0 lb

## 2017-05-04 DIAGNOSIS — Z0000 Encounter for general adult medical examination without abnormal findings: Secondary | ICD-10-CM

## 2017-05-04 DIAGNOSIS — E2839 Other primary ovarian failure: Secondary | ICD-10-CM

## 2017-05-04 MED ORDER — CRESTOR 20 MG PO TABS: 20.0000 mg | ORAL_TABLET | Freq: Every day | ORAL | 0 refills | Status: DC

## 2017-05-04 MED ORDER — ALBUTEROL SULFATE HFA 108 (90 BASE) MCG/ACT IN AERS: 1.0000 | INHALATION_SPRAY | Freq: Four times a day (QID) | RESPIRATORY_TRACT | 0 refills | Status: DC | PRN

## 2017-05-04 NOTE — Telephone Encounter (Signed)
In for [facility] 2/19 p PCP visit with labs on 04/26/2017  Asked to refill Crestor 20mg  q evening; she is out; lipids rechecked 2/11. Hepatic function 10/06/2016; Cardiology generally orders but does not have an apt at this time.  Last seen Dr. Sallyanne Kuster 08/17/2016 fup stress test was normal   Also was ordered Albuterol inhaler during an episode of illness. Did not use frequently but feels she wheezes at times.  Would like this refilled as well   Pease advise

## 2017-05-04 NOTE — Patient Instructions (Addendum)
Donna May , Thank you for taking time to come for your Medicare Wellness Visit. I appreciate your ongoing commitment to your health goals. Please review the following plan we discussed and let me know if I can assist you in the future.   Freestyle libre sytem and check with your pharmacist for coverage  See if Dr. Raliegh Ip will reorder crestor; send to mail order See if he will re-order albuterol to send to mail order as well - would prefer generic  Crestor 20 need refill  Needs a 90 day supply   Needs bone density' we can do this at [facility]  If so, we will need to fax them an order You can check with the cancer center to see if they did one in 2017 The last one was in Nov of 2015; We would like them done every 2 years  [name] will call you in 2 weeks to find out if I need to order one     A Tetanus is recommended every 10 years. Medicare covers a tetanus if you have a cut or wound; otherwise, there may be a charge. If you had not had a tetanus with pertusses, known as the Tdap, you can take this anytime.   You had the zoster in 2014  Shingrix is a vaccine for the prevention of Shingles in Adults 50 and older.  If you are on Medicare, you can request a prescription from your doctor to be filled at a pharmacy.  Please check with your benefits regarding applicable copays or out of pocket expenses.  The Shingrix is given in 2 vaccines approx 8 weeks apart. You must receive the 2nd dose prior to 6 months from receipt of the first. Will check to see if shingrix is covered here under a federal plan   Will make apt with Dr. Katy Fitch   Will complete your advanced Directive and bring Dr. Raliegh Ip a copy   Will fup with orthopedic of your choice regarding your knee and shoulder; you can always make an apt with Dr. Raliegh Ip    These are the goals we discussed: Goals    . Patient Stated     Will quit working!    . Patient Stated     When you feeling down;  Go shopping and not buy anything. [facility] the [facility];  [facility] on [address] planning a trip; give them a call        This is a list of the screening recommended for you and due dates:  Health Maintenance  Topic Date Due  . Tetanus Vaccine  01/31/1960  . Complete foot exam   09/06/2015  . Eye exam for diabetics  09/14/2017  . Hemoglobin A1C  10/24/2017  . Mammogram  11/19/2017  . Flu Shot  Completed  . DEXA scan (bone density measurement)  Completed  . Pneumonia vaccines  Completed      Fall Prevention in the Home Falls can cause injuries. They can happen to people of all ages. There are many things you can do to make your home safe and to help prevent falls. What can I do on the outside of my home?  Regularly fix the edges of walkways and driveways and fix any cracks.  Remove anything that might make you trip as you walk through a door, such as a raised step or threshold.  Trim any bushes or trees on the path to your home.  Use bright outdoor lighting.  Clear any walking paths  of anything that might make someone trip, such as rocks or tools.  Regularly check to see if handrails are loose or broken. Make sure that both sides of any steps have handrails.  Any raised decks and porches should have guardrails on the edges.  Have any leaves, snow, or ice cleared regularly.  Use sand or salt on walking paths during winter.  Clean up any spills in your garage right away. This includes oil or grease spills. What can I do in the bathroom?  Use night lights.  Install grab bars by the toilet and in the tub and shower. Do not use towel bars as grab bars.  Use non-skid mats or decals in the tub or shower.  If you need to sit down in the shower, use a plastic, non-slip stool.  Keep the floor dry. Clean up any water that spills on the floor as soon as it happens.  Remove soap buildup in the tub or shower regularly.  Attach bath mats securely with double-sided non-slip rug tape.  Do not have throw rugs and other  things on the floor that can make you trip. What can I do in the bedroom?  Use night lights.  Make sure that you have a light by your bed that is easy to reach.  Do not use any sheets or blankets that are too big for your bed. They should not hang down onto the floor.  Have a firm chair that has side arms. You can use this for support while you get dressed.  Do not have throw rugs and other things on the floor that can make you trip. What can I do in the kitchen?  Clean up any spills right away.  Avoid walking on wet floors.  Keep items that you use a lot in easy-to-reach places.  If you need to reach something above you, use a strong step stool that has a grab bar.  Keep electrical cords out of the way.  Do not use floor polish or wax that makes floors slippery. If you must use wax, use non-skid floor wax.  Do not have throw rugs and other things on the floor that can make you trip. What can I do with my stairs?  Do not leave any items on the stairs.  Make sure that there are handrails on both sides of the stairs and use them. Fix handrails that are broken or loose. Make sure that handrails are as long as the stairways.  Check any carpeting to make sure that it is firmly attached to the stairs. Fix any carpet that is loose or worn.  Avoid having throw rugs at the top or bottom of the stairs. If you do have throw rugs, attach them to the floor with carpet tape.  Make sure that you have a light switch at the top of the stairs and the bottom of the stairs. If you do not have them, ask someone to add them for you. What else can I do to help prevent falls?  Wear shoes that: ? Do not have high heels. ? Have rubber bottoms. ? Are comfortable and fit you well. ? Are closed at the toe. Do not wear sandals.  If you use a stepladder: ? Make sure that it is fully opened. Do not climb a closed stepladder. ? Make sure that both sides of the stepladder are locked into place. ? Ask  someone to hold it for you, if possible.  Clearly mark and make sure  that you can see: ? Any grab bars or handrails. ? First and last steps. ? Where the edge of each step is.  Use tools that help you move around (mobility aids) if they are needed. These include: ? Canes. ? Walkers. ? Scooters. ? Crutches.  Turn on the lights when you go into a dark area. Replace any light bulbs as soon as they burn out.  Set up your furniture so you have a clear path. Avoid moving your furniture around.  If any of your floors are uneven, fix them.  If there are any pets around you, be aware of where they are.  Review your medicines with your doctor. Some medicines can make you feel dizzy. This can increase your chance of falling. Ask your doctor what other things that you can do to help prevent falls. This information is not intended to replace advice given to you by your health care provider. Make sure you discuss any questions you have with your health care provider. Document Released: 12/27/2008 Document Revised: 08/08/2015 Document Reviewed: 04/06/2014 Elsevier Interactive Patient Education  2018 [place] Maintenance, Female Adopting a healthy lifestyle and getting preventive care can go a long way to promote health and wellness. Talk with your health care provider about what schedule of regular examinations is right for you. This is a good chance for you to check in with your provider about disease prevention and staying healthy. In between checkups, there are plenty of things you can do on your own. Experts have done a lot of research about which lifestyle changes and preventive measures are most likely to keep you healthy. Ask your health care provider for more information. Weight and diet Eat a healthy diet  Be sure to include plenty of vegetables, fruits, low-fat dairy products, and lean protein.  Do not eat a lot of foods high in solid fats, added sugars, or salt.  Get  regular exercise. This is one of the most important things you can do for your health. ? Most adults should exercise for at least 150 minutes each week. The exercise should increase your heart rate and make you sweat (moderate-intensity exercise). ? Most adults should also do strengthening exercises at least twice a week. This is in addition to the moderate-intensity exercise.  Maintain a healthy weight  Body mass index (BMI) is a measurement that can be used to identify possible weight problems. It estimates body fat based on height and weight. Your health care provider can help determine your BMI and help you achieve or maintain a healthy weight.  For females 21 years of age and older: ? A BMI below 18.5 is considered underweight. ? A BMI of 18.5 to 24.9 is normal. ? A BMI of 25 to 29.9 is considered overweight. ? A BMI of 30 and above is considered obese.  Watch levels of cholesterol and blood lipids  You should start having your blood tested for lipids and cholesterol at 79 years of age, then have this test every 5 years.  You may need to have your cholesterol levels checked more often if: ? Your lipid or cholesterol levels are high. ? You are older than 79 years of age. ? You are at high risk for heart disease.  Cancer screening Lung Cancer  Lung cancer screening is recommended for adults 53-47 years old who are at high risk for lung cancer because of a history of smoking.  A yearly low-dose CT scan of the lungs is recommended  for people who: ? Currently smoke. ? Have quit within the past 15 years. ? Have at least a 30-pack-year history of smoking. A pack year is smoking an average of one pack of cigarettes a day for 1 year.  Yearly screening should continue until it has been 15 years since you quit.  Yearly screening should stop if you develop a health problem that would prevent you from having lung cancer treatment.  Breast Cancer  Practice breast self-awareness. This  means understanding how your breasts normally appear and feel.  It also means doing regular breast self-exams. Let your health care provider know about any changes, no matter how small.  If you are in your 20s or 30s, you should have a clinical breast exam (CBE) by a health care provider every 1-3 years as part of a regular health exam.  If you are 32 or older, have a CBE every year. Also consider having a breast X-ray (mammogram) every year.  If you have a family history of breast cancer, talk to your health care provider about genetic screening.  If you are at high risk for breast cancer, talk to your health care provider about having an MRI and a mammogram every year.  Breast cancer gene (BRCA) assessment is recommended for women who have family members with BRCA-related cancers. BRCA-related cancers include: ? Breast. ? Ovarian. ? Tubal. ? Peritoneal cancers.  Results of the assessment will determine the need for genetic counseling and BRCA1 and BRCA2 testing.  Cervical Cancer Your health care provider may recommend that you be screened regularly for cancer of the pelvic organs (ovaries, uterus, and vagina). This screening involves a pelvic examination, including checking for microscopic changes to the surface of your cervix (Pap test). You may be encouraged to have this screening done every 3 years, beginning at age 23.  For women ages 98-65, health care providers may recommend pelvic exams and Pap testing every 3 years, or they may recommend the Pap and pelvic exam, combined with testing for human papilloma virus (HPV), every 5 years. Some types of HPV increase your risk of cervical cancer. Testing for HPV may also be done on women of any age with unclear Pap test results.  Other health care providers may not recommend any screening for nonpregnant women who are considered low risk for pelvic cancer and who do not have symptoms. Ask your health care provider if a screening pelvic exam  is right for you.  If you have had past treatment for cervical cancer or a condition that could lead to cancer, you need Pap tests and screening for cancer for at least 20 years after your treatment. If Pap tests have been discontinued, your risk factors (such as having a new sexual partner) need to be reassessed to determine if screening should resume. Some women have medical problems that increase the chance of getting cervical cancer. In these cases, your health care provider may recommend more frequent screening and Pap tests.  Colorectal Cancer  This type of cancer can be detected and often prevented.  Routine colorectal cancer screening usually begins at 79 years of age and continues through 79 years of age.  Your health care provider may recommend screening at an earlier age if you have risk factors for colon cancer.  Your health care provider may also recommend using home test kits to check for hidden blood in the stool.  A small camera at the end of a tube can be used to examine your colon  directly (sigmoidoscopy or colonoscopy). This is done to check for the earliest forms of colorectal cancer.  Routine screening usually begins at age 44.  Direct examination of the colon should be repeated every 5-10 years through 79 years of age. However, you may need to be screened more often if early forms of precancerous polyps or small growths are found.  Skin Cancer  Check your skin from head to toe regularly.  Tell your health care provider about any new moles or changes in moles, especially if there is a change in a mole's shape or color.  Also tell your health care provider if you have a mole that is larger than the size of a pencil eraser.  Always use sunscreen. Apply sunscreen liberally and repeatedly throughout the day.  Protect yourself by wearing long sleeves, pants, a wide-brimmed hat, and sunglasses whenever you are outside.  Heart disease, diabetes, and high blood  pressure  High blood pressure causes heart disease and increases the risk of stroke. High blood pressure is more likely to develop in: ? People who have blood pressure in the high end of the normal range (130-139/85-89 mm Hg). ? People who are overweight or obese. ? People who are African American.  If you are 12-37 years of age, have your blood pressure checked every 3-5 years. If you are 71 years of age or older, have your blood pressure checked every year. You should have your blood pressure measured twice-once when you are at a hospital or clinic, and once when you are not at a hospital or clinic. Record the average of the two measurements. To check your blood pressure when you are not at a hospital or clinic, you can use: ? An automated blood pressure machine at a pharmacy. ? A home blood pressure monitor.  If you are between 55 years and 22 years old, ask your health care provider if you should take aspirin to prevent strokes.  Have regular diabetes screenings. This involves taking a blood sample to check your fasting blood sugar level. ? If you are at a normal weight and have a low risk for diabetes, have this test once every three years after 79 years of age. ? If you are overweight and have a high risk for diabetes, consider being tested at a younger age or more often. Preventing infection Hepatitis B  If you have a higher risk for hepatitis B, you should be screened for this virus. You are considered at high risk for hepatitis B if: ? You were born in a country where hepatitis B is common. Ask your health care provider which countries are considered high risk. ? Your parents were born in a high-risk country, and you have not been immunized against hepatitis B (hepatitis B vaccine). ? You have HIV or AIDS. ? You use needles to inject street drugs. ? You live with someone who has hepatitis B. ? You have had sex with someone who has hepatitis B. ? You get hemodialysis  treatment. ? You take certain medicines for conditions, including cancer, organ transplantation, and autoimmune conditions.  Hepatitis C  Blood testing is recommended for: ? Everyone born from [birth year] through 1967. ? Anyone with known risk factors for hepatitis C.  Sexually transmitted infections (STIs)  You should be screened for sexually transmitted infections (STIs) including gonorrhea and chlamydia if: ? You are sexually active and are younger than 79 years of age. ? You are older than 79 years of age and your health care  provider tells you that you are at risk for this type of infection. ? Your sexual activity has changed since you were last screened and you are at an increased risk for chlamydia or gonorrhea. Ask your health care provider if you are at risk.  If you do not have HIV, but are at risk, it may be recommended that you take a prescription medicine daily to prevent HIV infection. This is called pre-exposure prophylaxis (PrEP). You are considered at risk if: ? You are sexually active and do not regularly use condoms or know the HIV status of your partner(s). ? You take drugs by injection. ? You are sexually active with a partner who has HIV.  Talk with your health care provider about whether you are at high risk of being infected with HIV. If you choose to begin PrEP, you should first be tested for HIV. You should then be tested every 3 months for as long as you are taking PrEP. Pregnancy  If you are premenopausal and you may become pregnant, ask your health care provider about preconception counseling.  If you may become pregnant, take 400 to 800 micrograms (mcg) of folic acid every day.  If you want to prevent pregnancy, talk to your health care provider about birth control (contraception). Osteoporosis and menopause  Osteoporosis is a disease in which the bones lose minerals and strength with aging. This can result in serious bone fractures. Your risk for osteoporosis  can be identified using a bone density scan.  If you are 6 years of age or older, or if you are at risk for osteoporosis and fractures, ask your health care provider if you should be screened.  Ask your health care provider whether you should take a calcium or vitamin D supplement to lower your risk for osteoporosis.  Menopause may have certain physical symptoms and risks.  Hormone replacement therapy may reduce some of these symptoms and risks. Talk to your health care provider about whether hormone replacement therapy is right for you. Follow these instructions at home:  Schedule regular health, dental, and eye exams.  Stay current with your immunizations.  Do not use any tobacco products including cigarettes, chewing tobacco, or electronic cigarettes.  If you are pregnant, do not drink alcohol.  If you are breastfeeding, limit how much and how often you drink alcohol.  Limit alcohol intake to no more than 1 drink per day for nonpregnant women. One drink equals 12 ounces of beer, 5 ounces of wine, or 1 ounces of hard liquor.  Do not use street drugs.  Do not share needles.  Ask your health care provider for help if you need support or information about quitting drugs.  Tell your health care provider if you often feel depressed.  Tell your health care provider if you have ever been abused or do not feel safe at home. This information is not intended to replace advice given to you by your health care provider. Make sure you discuss any questions you have with your health care provider. Document Released: 09/15/2010 Document Revised: 08/08/2015 Document Reviewed: 12/04/2014 Elsevier Interactive Patient Education  2018 [place] you for enrolling in [place]. Please follow the instructions below to securely access your online medical record. MyChart allows you to send messages to your doctor, view your test results, manage appointments, and more.   How Do I Sign  Up? 1. In your Internet browser, go to [employer] and enter [email or web address]. 2. Click on the Sign  Up Now link in the Sign In box. You will see the New Member Sign Up page. 3. Enter your MyChart Access Code exactly as it appears below. You will not need to use this code after you've completed the sign-up process. If you do not sign up before the expiration date, you must request a new code.  MyChart Access Code: [email or web address] Expires: 06/10/2017 11:16 AM  4. Enter your Social Security Number ([email or web address]) and Date of Birth (mm/dd/yyyy) as indicated and click Submit. You will be taken to the next sign-up page. 5. Create a MyChart ID. This will be your MyChart login ID and cannot be changed, so think of one that is secure and easy to remember. 6. Create a MyChart password. You can change your password at any time. 7. Enter your Password Reset Question and Answer. This can be used at a later time if you forget your password.  8. Enter your e-mail address. You will receive e-mail notification when new information is available in [place]. 9. Click Sign Up. You can now view your medical record.   Additional Information Remember, MyChart is NOT to be used for urgent needs. For medical emergencies, dial 911.

## 2017-05-04 NOTE — Progress Notes (Addendum)
Subjective:   Donna May is a 77 y.o. female who presents for Medicare Annual (Subsequent) preventive examination.  Reports health as Last AWV 04/10/2016  Hx; S/p right breast cancer with mastectomy Has rotator cuff in right shoulder which is self reported    Diet  04/2017 chol/hdl3; hdl 62 and trig 56  04/2017; BS 97 and A1c 6.5  BMI 29    Diet;  2018; incorporates fruits and vegetables;  Eats out most of the time / still does so but does not overeat Eats chicken and fish  Will eat sweets periodically  Breakfast had egg and cheese biscuit Does not eat during the day and then has dinner   Does not take metformin every day or [place]; Does not check BS; needs glucometer; A1c in good control; does not eat sweets often but eats out often;   Exercise 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 Dellis Filbert recent visit  Dr. Raliegh Ip did foot exam  - would like diabetic shoes   Colonoscopy will be due next year per Dr. Sharlett Iles  They will call her from [facility]    Objective:     Vitals: BP 120/70   Pulse 63   Ht 5\' 5"  (1.651 m)   Wt 175 lb (79.4 kg)   SpO2 98%   BMI 29.12 kg/m   Body mass index is 29.12 kg/m.  Advanced Directives 05/04/2017 04/10/2016 12/27/2015 12/04/2015 11/26/2015 11/26/2015  Does Patient Have a Medical Advance Directive? No No No No No No  Would patient like information on creating a medical advance directive? - - - Yes - Educational materials given - No - patient declined information   Advanced Directive; Reviewed advanced directive and agreed to receipt of information and discussion.  Focused face to face x  20 minutes discussing HCPOA and Living will and reviewed all the questions in the [place] forms. The patient voices understanding  of HCPOA; LW reviewed and information provided on each question. Educated on how to revoke this HCPOA or LW at any time.   Also  discussed life prolonging measures (given a few examples) and where she could choose to initiate or not;  the ability to given the HCPOA power to change her living will or not if she cannot speak for herself; as well as finalizing the will by 2 unrelated witnesses and notary.  Will call for questions and given information on [facility] pastoral department for further assistance.     Tobacco Social History   Tobacco Use  Smoking Status Former Smoker  . Packs/day: 0.01  . Years: 10.00  . Pack years: 0.10  Smokeless Tobacco Never Used  Tobacco Comment   quit 77 yo;      Counseling given: Yes Comment: quit 77 yo;    Clinical Intake:    Past Medical History:  Diagnosis Date  . Anemia   . Arthritis   . Breast cancer ([place]) 1992  . Diabetes mellitus    type 2  . Duodenitis 01/18/2002  . Fainting spell   . GERD (gastroesophageal reflux disease)   . Heart murmur   . Hiatal hernia 08/08/2008, 01/18/2002  . History of pneumonia   . Hyperlipidemia   . Hypertension   .  Lymphedema 2017   Right arm  . UTI (lower urinary tract infection)    Past Surgical History:  Procedure Laterality Date  . AXILLARY SURGERY     cyst removal, right  . MASTECTOMY  1992   right, with flap  . NM MYOCAR PERF WALL MOTION  06/11/2009   Protocol:Bruce, post stress EF58%, EKG negative for ischemia, low risk  . TONSILLECTOMY    . TRANSTHORACIC ECHOCARDIOGRAM  12/24/2009   LVEF =>55%, normal study   Family History  Problem Relation Age of Onset  . Breast cancer Cousin   . Diabetes Brother   . Heart disease Mother   . Hypertension Mother   . Heart disease Father   . Hypertension Father   . Prostate cancer Brother   . Heart attack Maternal Grandmother   . Stroke Maternal Grandfather    Social History   Socioeconomic History  . Marital status: Single    Spouse name:  Not on file  . Number of children: 2  . Years of education: Not on file  . Highest education level: Not on file  Social Needs  . Financial resource strain: Not on file  . Food insecurity - worry: Not on file  . Food insecurity - inability: Not on file  . Transportation needs - medical: Not on file  . Transportation needs - non-medical: Not on file  Occupational History  . Occupation: retired  Tobacco Use  . Smoking status: Former Smoker    Packs/day: 0.01    Years: 10.00    Pack years: 0.10  . Smokeless tobacco: Never Used  . Tobacco comment: quit 77 yo;   Substance and Sexual Activity  . Alcohol use: Yes    Alcohol/week: 0.0 oz    Comment: occ  . Drug use: No  . Sexual activity: Yes    Partners: Male    Birth control/protection: Condom    Comment: First sexual encounter age 25. Fewer than 5 partners in life time.  Other Topics Concern  . Not on file  Social History Narrative  . Not on file    Outpatient Encounter Medications as of 05/04/2017  Medication Sig  . albuterol (PROVENTIL HFA;VENTOLIN HFA) 108 (90 Base) MCG/ACT inhaler Inhale 1-2 puffs into the lungs every 6 (six) hours as needed for wheezing or shortness of breath.  . AMBULATORY NON FORMULARY MEDICATION Place 0.0125 g rectally 2 (two) times daily. Medication Name: Nitroglycerin ointment .2595. Apply 2-3 times daily as needed.  Marland Kitchen amLODipine (NORVASC) 10 MG tablet Take 1 tablet (10 mg total) by mouth daily.  Marland Kitchen aspirin 81 MG tablet Take 81 mg by mouth daily.  Marland Kitchen CALCIUM-MAGNESIUM-VITAMIN D PO Take 1 tablet by mouth daily.  . Cholecalciferol (VITAMIN D3) 2000 UNITS TABS Take 1 tablet by mouth daily.  . CRESTOR 20 MG tablet Take 1 tablet (20 mg total) by mouth daily.  . Echinacea 400 MG CAPS Take 1 capsule by mouth daily.  Marland Kitchen JANUVIA 100 MG tablet Take 100 mg by mouth daily.   . lansoprazole (PREVACID) 30 MG capsule Take 1 capsule (30 mg total) by mouth daily before breakfast.  . metFORMIN (GLUCOPHAGE-XR) 500 MG 24 hr  tablet Take 500 mg by mouth 2 (two) times daily before a meal.   . metoprolol succinate (TOPROL-XL) 100 MG 24 hr tablet TAKE 1 TABLET (100 MG TOTAL) BY MOUTH DAILY. TAKE WITH OR IMMEDIATELY FOLLOWING A MEAL.  . Multiple Vitamins-Minerals (CENTRUM SILVER ULTRA WOMENS) TABS Take 1 tablet by mouth daily.  Marland Kitchen  Omega 3 1000 MG CAPS Take 1 capsule by mouth daily.  . raloxifene (EVISTA) 60 MG tablet TAKE 1 TABLET DAILY  . valsartan-hydrochlorothiazide (DIOVAN-HCT) 320-25 MG tablet Take 1 tablet by mouth daily.   Facility-Administered Encounter Medications as of 05/04/2017  Medication  . triamcinolone acetonide (KENALOG) 10 MG/ML injection 10 mg    Activities of Daily Living In your present state of health, do you have any difficulty performing the following activities: 05/04/2017 04/26/2017  Hearing? N N  Vision? N N  Difficulty concentrating or making decisions? N N  Walking or climbing stairs? N N  Dressing or bathing? N N  Doing errands, shopping? N N  Preparing Food and eating ? N -  Using the Toilet? N -  In the past six months, have you accidently leaked urine? N -  Comment urgency  -  Do you have problems with loss of bowel control? N -  Managing your Medications? N -  Managing your Finances? N -  Housekeeping or managing your Housekeeping? N -  Some recent data might be hidden    Patient Care Team: Marletta Lor, MD as PCP - General (Internal Medicine) [name], [name] Dad, MD as Consulting Physician (Oncology) [name], [name], MD as Consulting Physician (Cardiology) [name], MD as Consulting Physician (Obstetrics and Gynecology) [name] [name], NP as Nurse Practitioner (Hematology and Oncology)    Assessment:   This is a routine wellness examination for [place].  Exercise Activities and Dietary recommendations    Goals    . Patient Stated     Will quit working!    . Patient Stated     When you feeling down;  Go shopping and not buy  anything. [facility] the [facility] center;  [facility] [place] a trip; give them a call        Fall Risk Fall Risk  05/04/2017 04/26/2017 04/20/2016 09/06/2014 04/28/2013  Falls in the past year? Yes Yes No No No  Comment bent over to pick up the paper and slipped and fell out of the car  - - - -  Number falls in past yr: 1 1 - - -  Injury with Fall? - No - - -  Follow up Education provided - - - -   Fall out of the car while trying to pick up paper Bent over and fell; scratched up knee Educated on risk of bending over .  Depression Screen PHQ 2/9 Scores 05/04/2017 04/26/2017 04/20/2016 09/06/2014  PHQ - 2 Score 0 0 1 0    She gets depressed at times lonliness at times  Cognitive Function MMSE - Mini Mental State Exam 05/04/2017  Not completed: (No Data)     Ad8 score reviewed for issues:  Issues making decisions:  Less interest in hobbies / activities:  Repeats questions, stories (family complaining):  Trouble using ordinary gadgets (microwave, computer, phone):  Forgets the month or year:   Mismanaging finances:   Remembering appts:  Daily problems with thinking and/or memory: Ad8 score is=0     Immunization History  Administered Date(s) Administered  . Influenza, High Dose Seasonal PF 01/12/2014, 02/04/2015, 02/11/2017  . Influenza,inj,Quad PF,6+ Mos 12/27/2015  . Influenza-Unspecified 12/14/2012  . Pneumococcal Conjugate-13 01/12/2014  . Pneumococcal Polysaccharide-23 02/04/2015  . Zoster 05/02/2012      Screening Tests Health Maintenance  Topic Date Due  . TETANUS/TDAP  05/04/2018 (Originally 01/31/1960)  . OPHTHALMOLOGY EXAM  09/14/2017  . HEMOGLOBIN A1C  10/24/2017  . MAMMOGRAM  11/19/2017  . FOOT EXAM  04/26/2018  . INFLUENZA VACCINE  Completed  . DEXA SCAN  Completed  . PNA vac Low Risk Adult  Completed         Plan:      PCP Notes   Health Maintenance  Crestor and albuterol needs to be refilled; will send basket note    Thinks she had bone density at the cancer center but she will check I will fup in 2 weeks to see if I need to order.  Mammogram was just completed   Will check to see if her federal plans covers Tdap in the office  Will make an eye apt with Dr. Katy Fitch which is due   Will complete her AD and bring Dr. Raliegh Ip a copy     Abnormal Screens  None    Referrals  NOTE: Dr. Sallyanne Kuster ordered  Crestor 20mg  q d; labs checked  04/26/2017 and she is out of this medication; no apt for cardiology scheduled  Also had Albuterol inhaler as needed prn; feels she wheezes sometimes; note noted today    Patient concerns; Med refills as noted   Would like diabetic shoes she can get an order.   Nurse Concerns; Right shoulder pain; not sure if she can fup with surgery due to lymphedema -    Also issue with left knee; sometimes it feels it "gives out" with her  States knee was not hurting when she seen Dr. Raliegh Ip  1 week ago; she hit her knee and didn't hurt at the time but now hurting  Plans to make an apt with Percell Miller and Noemi Chapel as she is also having back issues and pain with decreased ability to stand      Next PCP apt Just seen 04/26/2017    I have personally reviewed and noted the following in the patient's chart:   . Medical and social history . Use of alcohol, tobacco or illicit drugs  . Current medications and supplements . Functional ability and status . Nutritional status . Physical activity . Advanced directives . List of other physicians . Hospitalizations, surgeries, and ER visits in previous 12 months . Vitals . Screenings to include cognitive, depression, and falls . Referrals and appointments  In addition, I have reviewed and discussed with patient certain preventive protocols, quality metrics, and best practice recommendations. A written personalized care plan for preventive services as well as general preventive health recommendations were provided to patient.     ESPQZ,RAQTM,  RN  05/04/2017  Results of subsequent Medicare wellness visit reviewed and agree with findings.  Will refill albuterol and Crestor  Donna May

## 2017-05-04 NOTE — Telephone Encounter (Signed)
Dr. Raliegh Ip, Ms. Thew, mentioned she would like to have diabetic shoes.  Do you need to see her or other.  Thanks

## 2017-05-04 NOTE — Telephone Encounter (Signed)
Per Dr. Raliegh Ip, Albuterol and Crestor reordered and sent to mail order pharmacy at the patients' request  Wynetta Fines RN / Order per Dr. Raliegh Ip' s basket note

## 2017-05-04 NOTE — Telephone Encounter (Signed)
AWV on 05/04/17 Last dexa was 2015; s/p breast cancer and feels the cancer center did one in 2017 She will check and I will call her back in 2 weeks to fup  March 6th fup

## 2017-05-04 NOTE — Telephone Encounter (Signed)
Okay for Crestor and albuterol refill

## 2017-05-04 NOTE — Telephone Encounter (Signed)
I will complete paperwork to place order for diabetic shoes-patient will need to have paperwork sent to the office

## 2017-05-07 NOTE — Telephone Encounter (Signed)
Call to Ms. Bigos and left a VM for her to call back regarding her Diabetic shoes.  Left my number [phone].  Wynetta Fines RN

## 2017-05-12 NOTE — Telephone Encounter (Signed)
Call back to ms. Lech and no answer. Call placed to her dtr Brandi, LVM that Ms. Doi can go to the DME of her choice to request diabetic shoes. She will get evaluated and given forms which Dr. Raliegh Ip will complete  Also recommended dexa scan as [facility] does not have a record of doing one.,  Left my number for call back... [place]

## 2017-05-12 NOTE — Telephone Encounter (Signed)
Call back from Ms. Broerman She is going to go to a podiatrist anyway and will ask them for forms and will send to Dr Raliegh Ip if needed. Told her the DME Has to be medicare certified.  Also, she will [facility] at [facility]. I will send an order to them and [facility] should be calling her for an apt.I will fax over today.  Wynetta Fines RN

## 2017-05-14 ENCOUNTER — Telehealth: Payer: Self-pay | Admitting: Internal Medicine

## 2017-05-14 NOTE — Telephone Encounter (Signed)
Copied from [place] [id number]. Topic: Quick Communication - Rx Refill/Question >> May 14, 2017  5:56 PM Oliver Pila B wrote: Pt [facility]  Medication:  albuterol (PROVENTIL HFA;VENTOLIN HFA) 108 (90 Base) MCG/ACT inhaler [[id number]]  3 month supply/90 day supply   Has the patient contacted their pharmacy? Yes.     (Agent: If no, request that the patient contact the pharmacy for the refill.)   [facility]erred Pharm[facility] (with phone number [facility] street [facility] [address]   Agent: Please be advised that RX refills may take up to 3 business days. We ask that you follow-up with your pharmacy.

## 2017-05-17 MED ORDER — ALBUTEROL SULFATE HFA 108 (90 BASE) MCG/ACT IN AERS: 1.0000 | INHALATION_SPRAY | Freq: Four times a day (QID) | RESPIRATORY_TRACT | 3 refills | Status: DC | PRN

## 2017-05-17 NOTE — Telephone Encounter (Signed)
Medication [facility] [address]. Left message for patient to call back for info.

## 2017-05-17 NOTE — Telephone Encounter (Signed)
Request for refill for albuterol inhaler. [facility] [address], [place]. She is requesting a 3 month supply.   Last refill was 05/04/17  Provider: Dr. Burnice Logan  LOV  04/26/17  Please review.

## 2017-05-18 NOTE — Telephone Encounter (Signed)
Called patient again and left message to return phone call. Informing patient of refills. No further action needed.

## 2017-05-25 ENCOUNTER — Ambulatory Visit (INDEPENDENT_AMBULATORY_CARE_PROVIDER_SITE_OTHER): Payer: Medicare Other | Admitting: Gastroenterology

## 2017-05-25 ENCOUNTER — Encounter: Payer: Self-pay | Admitting: Gastroenterology

## 2017-05-25 VITALS — BP 122/76 | HR 64 | Ht 65.0 in | Wt 169.2 lb

## 2017-05-25 DIAGNOSIS — Z1211 Encounter for screening for malignant neoplasm of colon: Secondary | ICD-10-CM | POA: Diagnosis not present

## 2017-05-25 DIAGNOSIS — K5900 Constipation, unspecified: Secondary | ICD-10-CM

## 2017-05-25 DIAGNOSIS — I701 Atherosclerosis of renal artery: Secondary | ICD-10-CM | POA: Diagnosis not present

## 2017-05-25 DIAGNOSIS — K219 Gastro-esophageal reflux disease without esophagitis: Secondary | ICD-10-CM | POA: Diagnosis not present

## 2017-05-25 MED ORDER — LANSOPRAZOLE 15 MG PO CPDR: 15.0000 mg | DELAYED_RELEASE_CAPSULE | Freq: Every day | ORAL | 3 refills | Status: DC

## 2017-05-25 NOTE — Patient Instructions (Signed)
If you are age 79 or older, your body mass index should be between 23-30. Your Body mass index is 28.16 kg/m. If this is out of the aforementioned range listed, please consider follow up with your Primary Care Provider.  If you are age 35 or younger, your body mass index should be between 19-25. Your Body mass index is 28.16 kg/m. If this is out of the aformentioned range listed, please consider follow up with your Primary Care Provider.   We have sent the following medications to your pharmacy: Prevacid 15mg : Take once a day   Please begin taking a daily fiber supplement such as Citrucel. You can purchase this over the counter and take as directed, daily.  You will be due for a recall colonoscopy in May 2020. We will send you a reminder in the mail when it gets closer to that time.  Thank you for entrusting me with your care and for choosing [facility], Dr. Orwin Cellar

## 2017-05-25 NOTE — Progress Notes (Signed)
HPI :  77 year old female here for follow-up visit.  She is a history of reflux but is long-standing. She has been on Prevacid 30 mg once She had an EGD in 2010 showing a 4-5cm hiatal hernia otherwise no evidence of reflux esophagitis or Barrett's esophagus. She states she doing really well since the last visit. She states the Prevacid is working well and denies any breakthrough reflux symptoms.  She denies any problems is eating and denies any problem with abdominal pains. We discussed the risks and benefits Of long-term PPI use today.   She endorses periodic constipation along with periodic loose stools at times. She is not using anything for her  Bowels at present. She denies any blood in her stools. Last colonoscopy in 2010 was normal.  Endoscopic history: Colonoscopy 2010 - normal EGD May 2010 - 4-5 cm hiatal hernia   Past Medical History:  Diagnosis Date  . Anemia   . Arthritis   . Breast cancer ([place]) 1992  . Diabetes mellitus    type 2  . Duodenitis 01/18/2002  . Fainting spell   . GERD (gastroesophageal reflux disease)   . Heart murmur   . Hiatal hernia 08/08/2008, 01/18/2002  . History of pneumonia   . Hyperlipidemia   . Hypertension   . Lymphedema 2017   Right arm  . UTI (lower urinary tract infection)      Past Surgical History:  Procedure Laterality Date  . AXILLARY SURGERY     cyst removal, right  . MASTECTOMY  1992   right, with flap  . NM MYOCAR PERF WALL MOTION  06/11/2009   Protocol:Bruce, post stress EF58%, EKG negative for ischemia, low risk  . TONSILLECTOMY    . TRANSTHORACIC ECHOCARDIOGRAM  12/24/2009   LVEF =>55%, normal study   Family History  Problem Relation Age of Onset  . Breast cancer Cousin   . Diabetes Brother   . Heart disease Mother   . Hypertension Mother   . Heart disease Father   . Hypertension Father   . Prostate cancer Brother   . Heart attack Maternal Grandmother   . Stroke Maternal Grandfather    Social History    Tobacco Use  . Smoking status: Former Smoker    Packs/day: 0.01    Years: 10.00    Pack years: 0.10  . Smokeless tobacco: Never Used  . Tobacco comment: quit 77 yo;   Substance Use Topics  . Alcohol use: Yes    Alcohol/week: 0.0 oz    Comment: occ  . Drug use: No   Current Outpatient Medications  Medication Sig Dispense Refill  . albuterol (PROVENTIL HFA;VENTOLIN HFA) 108 (90 Base) MCG/ACT inhaler Inhale 1-2 puffs into the lungs every 6 (six) hours as needed for wheezing or shortness of breath. 1 Inhaler 3  . AMBULATORY NON FORMULARY MEDICATION Place 0.0125 g rectally 2 (two) times daily. Medication Name: Nitroglycerin ointment .2505. Apply 2-3 times daily as needed. 1 Tube 1  . amLODipine (NORVASC) 10 MG tablet Take 1 tablet (10 mg total) by mouth daily. 90 tablet 2  . aspirin 81 MG tablet Take 81 mg by mouth daily.    Marland Kitchen CALCIUM-MAGNESIUM-VITAMIN D PO Take 1 tablet by mouth daily.    . Cholecalciferol (VITAMIN D3) 2000 UNITS TABS Take 1 tablet by mouth daily.    . CRESTOR 20 MG tablet Take 1 tablet (20 mg total) by mouth daily. 90 tablet 0  . Echinacea 400 MG CAPS Take 1 capsule by mouth daily.    Marland Kitchen  JANUVIA 100 MG tablet Take 100 mg by mouth daily.     . lansoprazole (PREVACID) 30 MG capsule Take 1 capsule (30 mg total) by mouth daily before breakfast. 90 capsule 0  . metFORMIN (GLUCOPHAGE-XR) 500 MG 24 hr tablet Take 500 mg by mouth 2 (two) times daily before a meal.     . metoprolol succinate (TOPROL-XL) 100 MG 24 hr tablet TAKE 1 TABLET (100 MG TOTAL) BY MOUTH DAILY. TAKE WITH OR IMMEDIATELY FOLLOWING A MEAL. 90 tablet 1  . Multiple Vitamins-Minerals (CENTRUM SILVER ULTRA WOMENS) TABS Take 1 tablet by mouth daily.    . Omega 3 1000 MG CAPS Take 1 capsule by mouth daily.    . raloxifene (EVISTA) 60 MG tablet TAKE 1 TABLET DAILY 90 tablet 0  . valsartan-hydrochlorothiazide (DIOVAN-HCT) 320-25 MG tablet Take 1 tablet by mouth daily. 90 tablet 3   Current Facility-Administered  Medications  Medication Dose Route Frequency Provider Last Rate Last Dose  . triamcinolone acetonide (KENALOG) 10 MG/ML injection 10 mg  10 mg Other Once Harriet Masson, DPM       No Known Allergies   Review of Systems: All systems reviewed and negative except where noted in HPI.   Lab Results  Component Value Date   WBC 5.7 04/26/2017   HGB 12.9 04/26/2017   HCT 39.2 04/26/2017   MCV 86.0 04/26/2017   PLT 235 04/26/2017    Lab Results  Component Value Date   CREATININE 1.20 04/26/2017   BUN 21 04/26/2017   NA 142 04/26/2017   K 3.9 04/26/2017   CL 104 04/26/2017   CO2 29 04/26/2017    Lab Results  Component Value Date   ALT 11 04/26/2017   AST 14 04/26/2017   ALKPHOS 49 04/26/2017   BILITOT 0.4 04/26/2017     Physical Exam: BP 122/76   Pulse 64   Ht 5\' 5"  (1.651 m)   Wt 169 lb 3.2 oz (76.7 kg)   BMI 28.16 kg/m  Constitutional: Pleasant,well-developed, female in no acute distress. HEENT: Normocephalic and atraumatic. Conjunctivae are normal. No scleral icterus. Neck supple.  Cardiovascular: Normal rate, regular rhythm.  Pulmonary/chest: Effort normal and breath sounds normal. No wheezing, rales or rhonchi. Abdominal: Soft, nondistended, nontender.  There are no masses palpable. No hepatomegaly. Extremities: no edema Lymphadenopathy: No cervical adenopathy noted. Neurological: Alert and oriented to person place and time. Skin: Skin is warm and dry. No rashes noted. Psychiatric: Normal mood and affect. Behavior is normal.   ASSESSMENT AND PLAN: 77 year old female here for a follow up visit to discuss the following issues:  GERD - very well controlled on current dose of Prevacid. No breakthrough or alarm symptoms. No history of Barrett's. We discussed risks / benefits of long term PPI use. I recommend she decrease her dose of prevacid as tolerated, will change to 15mg  / day. If she does well with this over time, will try to switch to Zantac. She agreed with  the plan.   Alternating bowel habits - recommend trial of Citrucel once daily or other fiber supplementation. She agreed. No alarm symptoms.   Colon cancer screening - she is otherwise healthy and wanted to continue with screening exams. She would next be due in 07/2018. Will reassess her at that time to determine if she still wishes to have screening.   Sturgeon Cellar, MD [facility] Gastroenterology Pager [phone]

## 2017-06-11 ENCOUNTER — Ambulatory Visit (INDEPENDENT_AMBULATORY_CARE_PROVIDER_SITE_OTHER): Payer: Medicare Other | Admitting: Podiatry

## 2017-06-11 DIAGNOSIS — E119 Type 2 diabetes mellitus without complications: Secondary | ICD-10-CM

## 2017-06-11 DIAGNOSIS — E1151 Type 2 diabetes mellitus with diabetic peripheral angiopathy without gangrene: Secondary | ICD-10-CM

## 2017-06-11 DIAGNOSIS — M170 Bilateral primary osteoarthritis of knee: Secondary | ICD-10-CM | POA: Diagnosis not present

## 2017-06-11 DIAGNOSIS — B351 Tinea unguium: Secondary | ICD-10-CM | POA: Diagnosis not present

## 2017-06-11 NOTE — Progress Notes (Signed)
Subjective:  Patient ID: Donna May, female    DOB: 24-Jul-1940,  MRN: [id number]  Chief Complaint  Patient presents with  . Diabetes    diabetic foot exam/debride  . Ingrown Toenail    4th right toenail feels ingrown   77 y.o. female presents with the above complaint.  Reports painful ingrown nails to both feet.  Has history of ingrown nails that were excised.  Reports painful calluses some of the areas of the toes.  Request paperwork for diabetic shoes. Past Medical History:  Diagnosis Date  . Anemia   . Arthritis   . Breast cancer ([place]) 1992  . Diabetes mellitus    type 2  . Duodenitis 01/18/2002  . Fainting spell   . GERD (gastroesophageal reflux disease)   . Heart murmur   . Hiatal hernia 08/08/2008, 01/18/2002  . History of pneumonia   . Hyperlipidemia   . Hypertension   . Lymphedema 2017   Right arm  . UTI (lower urinary tract infection)    Past Surgical History:  Procedure Laterality Date  . AXILLARY SURGERY     cyst removal, right  . MASTECTOMY  1992   right, with flap  . NM MYOCAR PERF WALL MOTION  06/11/2009   Protocol:Bruce, post stress EF58%, EKG negative for ischemia, low risk  . TONSILLECTOMY    . TRANSTHORACIC ECHOCARDIOGRAM  12/24/2009   LVEF =>55%, normal study    Current Outpatient Medications:  .  albuterol (PROVENTIL HFA;VENTOLIN HFA) 108 (90 Base) MCG/ACT inhaler, Inhale 1-2 puffs into the lungs every 6 (six) hours as needed for wheezing or shortness of breath., Disp: 1 Inhaler, Rfl: 3 .  AMBULATORY NON FORMULARY MEDICATION, Place 0.0125 g rectally 2 (two) times daily. Medication Name: Nitroglycerin ointment .6213. Apply 2-3 times daily as needed., Disp: 1 Tube, Rfl: 1 .  amLODipine (NORVASC) 10 MG tablet, Take 1 tablet (10 mg total) by mouth daily., Disp: 90 tablet, Rfl: 2 .  aspirin 81 MG tablet, Take 81 mg by mouth daily., Disp: , Rfl:  .  CALCIUM-MAGNESIUM-VITAMIN D PO, Take 1 tablet by mouth daily., Disp: , Rfl:  .  Cholecalciferol (VITAMIN  D3) 2000 UNITS TABS, Take 1 tablet by mouth daily., Disp: , Rfl:  .  CRESTOR 20 MG tablet, Take 1 tablet (20 mg total) by mouth daily., Disp: 90 tablet, Rfl: 0 .  Echinacea 400 MG CAPS, Take 1 capsule by mouth daily., Disp: , Rfl:  .  JANUVIA 100 MG tablet, Take 100 mg by mouth daily. , Disp: , Rfl:  .  lansoprazole (PREVACID) 15 MG capsule, Take 1 capsule (15 mg total) by mouth daily before breakfast., Disp: 90 capsule, Rfl: 3 .  metFORMIN (GLUCOPHAGE-XR) 500 MG 24 hr tablet, Take 500 mg by mouth 2 (two) times daily before a meal. , Disp: , Rfl:  .  metoprolol succinate (TOPROL-XL) 100 MG 24 hr tablet, TAKE 1 TABLET (100 MG TOTAL) BY MOUTH DAILY. TAKE WITH OR IMMEDIATELY FOLLOWING A MEAL., Disp: 90 tablet, Rfl: 1 .  Multiple Vitamins-Minerals (CENTRUM SILVER ULTRA WOMENS) TABS, Take 1 tablet by mouth daily., Disp: , Rfl:  .  Omega 3 1000 MG CAPS, Take 1 capsule by mouth daily., Disp: , Rfl:  .  raloxifene (EVISTA) 60 MG tablet, TAKE 1 TABLET DAILY, Disp: 90 tablet, Rfl: 0 .  sucralfate (CARAFATE) 1 GM/10ML suspension, Take 1 g by mouth every 6 (six) hours as needed., Disp: , Rfl:  .  valsartan-hydrochlorothiazide (DIOVAN-HCT) 320-25 MG tablet, Take 1  tablet by mouth daily., Disp: 90 tablet, Rfl: 3  Current Facility-Administered Medications:  .  triamcinolone acetonide (KENALOG) 10 MG/ML injection 10 mg, 10 mg, Other, Once, Sikora, Richard, DPM  No Known Allergies Review of Systems Objective:   General AA&O x3. Normal mood and affect.  Vascular Dorsalis pedis pulses present 1+ bilaterally  Posterior tibial pulses absent bilaterally  Capillary refill normal to all digits. Pedal hair growth normal.  Neurologic Epicritic sensation present bilaterally. Protective sensation with 5.07 monofilament  present bilaterally. Vibratory sensation present bilaterally.  Dermatologic No open lesions. Interspaces clear of maceration.  Normal skin temperature and turgor. Hyperkeratotic lesions: None  bilaterally. Nails: brittle, onychomycosis, thickening, elongation *6. Evidence of prior bilat hallux nail removal.  Orthopedic: No history of amputation. MMT 5/5 in dorsiflexion, plantarflexion, inversion, and eversion. Normal lower extremity joint ROM without pain or crepitus HAV deformity bilateral, lesser digital contractures bilateral.   Assessment & Plan:  Patient was evaluated and treated and all questions answered.  Diabetes with PAD, Onychomycosis -Educated on diabetic footcare. Diabetic risk level 1 -Nails x6 debrided sharply and manually with large nail nipper and rotary burr. -Made appointment for patient for fabrication of diabetic shoes.  Return in about 3 months (around 09/11/2017) for Diabetic Foot Care.

## 2017-06-29 ENCOUNTER — Ambulatory Visit: Payer: Medicare Other | Admitting: Orthotics

## 2017-06-29 DIAGNOSIS — E1151 Type 2 diabetes mellitus with diabetic peripheral angiopathy without gangrene: Secondary | ICD-10-CM

## 2017-06-29 NOTE — Progress Notes (Signed)
Patient measured for diabtetic shoes.  [place] device size 40M, shoe is [id number]. Doctor is March Rummage, PCP is [facility]

## 2017-07-02 ENCOUNTER — Encounter: Payer: Self-pay | Admitting: Internal Medicine

## 2017-07-02 DIAGNOSIS — M8589 Other specified disorders of bone density and structure, multiple sites: Secondary | ICD-10-CM | POA: Diagnosis not present

## 2017-07-13 ENCOUNTER — Encounter: Payer: Self-pay | Admitting: Anesthesiology

## 2017-07-14 DIAGNOSIS — M47816 Spondylosis without myelopathy or radiculopathy, lumbar region: Secondary | ICD-10-CM | POA: Diagnosis not present

## 2017-07-14 DIAGNOSIS — M170 Bilateral primary osteoarthritis of knee: Secondary | ICD-10-CM | POA: Diagnosis not present

## 2017-07-26 NOTE — Progress Notes (Signed)
CRM created. 

## 2017-07-27 ENCOUNTER — Telehealth: Payer: Self-pay | Admitting: Internal Medicine

## 2017-07-27 NOTE — Telephone Encounter (Signed)
Result notes given to patient in result notes in [facility] pool

## 2017-07-27 NOTE — Telephone Encounter (Signed)
Copied from [place] [id number]. Topic: Quick Communication - Lab Results >> Jul 26, 2017  5:39 PM Franco Collet, CMA wrote: Called patient to inform them of  lab results. When patient returns call, triage nurse may disclose results.

## 2017-08-11 DIAGNOSIS — M47816 Spondylosis without myelopathy or radiculopathy, lumbar region: Secondary | ICD-10-CM | POA: Diagnosis not present

## 2017-08-11 DIAGNOSIS — M170 Bilateral primary osteoarthritis of knee: Secondary | ICD-10-CM | POA: Diagnosis not present

## 2017-08-12 ENCOUNTER — Other Ambulatory Visit: Payer: Self-pay | Admitting: Obstetrics & Gynecology

## 2017-08-19 ENCOUNTER — Telehealth: Payer: Self-pay | Admitting: Internal Medicine

## 2017-08-19 NOTE — Telephone Encounter (Signed)
Copied from [place] [id number]. Topic: Quick Communication - See Telephone Encounter >> Aug 19, 2017  4:55 PM Vernona Rieger wrote: CRM for notification. See Telephone encounter for: 08/19/17. Patient said that she was at her heart doctor today and they advised her to call her for results from Dr Marthann Schiller office. Patient said she isn't sure what this is about. Call back is [phone]

## 2017-08-20 NOTE — Telephone Encounter (Signed)
Left message to return phone call.

## 2017-08-20 NOTE — Telephone Encounter (Signed)
Spoke with pt. She is not sure what labs or results she was needing. There is nothing recent in her chart. Pt asked about how she can see her lab results, advised pt she can sign up for MyChart. She would like assistance with this. She will ask for this at next OV. Nothing further needed.

## 2017-08-27 ENCOUNTER — Encounter: Payer: Self-pay | Admitting: Adult Health

## 2017-08-27 ENCOUNTER — Telehealth: Payer: Self-pay | Admitting: Adult Health

## 2017-08-27 ENCOUNTER — Inpatient Hospital Stay: Payer: Medicare Other | Attending: Adult Health | Admitting: Adult Health

## 2017-08-27 VITALS — BP 156/69 | HR 59 | Temp 98.4°F | Resp 18 | Ht 65.0 in | Wt 166.2 lb

## 2017-08-27 DIAGNOSIS — Z9011 Acquired absence of right breast and nipple: Secondary | ICD-10-CM

## 2017-08-27 DIAGNOSIS — Z86000 Personal history of in-situ neoplasm of breast: Secondary | ICD-10-CM | POA: Diagnosis not present

## 2017-08-27 DIAGNOSIS — Z171 Estrogen receptor negative status [ER-]: Secondary | ICD-10-CM

## 2017-08-27 DIAGNOSIS — I890 Lymphedema, not elsewhere classified: Secondary | ICD-10-CM | POA: Insufficient documentation

## 2017-08-27 DIAGNOSIS — C50911 Malignant neoplasm of unspecified site of right female breast: Secondary | ICD-10-CM

## 2017-08-27 NOTE — Telephone Encounter (Signed)
Gave patient avs and calendar of upcoming June 2020 appointments.  °

## 2017-08-27 NOTE — Progress Notes (Signed)
CLINIC:  Survivorship   REASON FOR VISIT:  Routine follow-up for history of breast cancer.   BRIEF ONCOLOGIC HISTORY:  Per Dr. [name] Dad 12/27/2015 note  (1) Status post right mastectomy and axillary lymph node dissection March 1992 for what by the patient's recollection was a noninvasive breast cancer  (2) right upper extremity lymphedema developing August 2017 (a) right upper extremity Doppler ultrasonography 10/29/2015 shows no evidence of clot (b) right axillary ultrasonography at [facility] 10/31/2015 shows a single likely benign lymph node (c) chest CT and bone scan showed no evidence of recurrence or metastatic disease   INTERVAL HISTORY:  Donna May presents to the [facility] today for routine follow-up for her history of breast cancer.   She continues to have right arm lymphedema.  She says that her arm is heavier when the sleeve is on.  She wears the sleeve intermittently.  She is doing well today.   Donna May, Donna May.  Donna May.  She says she has one more appointment with him.  Donna May.  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  She is considering going to part time work.      REVIEW OF SYSTEMS:  Review of Systems  Constitutional: Negative for appetite change, chills, fatigue, fever and unexpected weight change.  HENT:   Negative for hearing loss, lump/mass and trouble swallowing.   Eyes: Negative for eye problems and icterus.  Respiratory: Negative for chest tightness, cough and shortness of breath.   Cardiovascular: Negative for chest pain, leg swelling and palpitations.  Gastrointestinal: Negative for abdominal distention and abdominal pain.  Endocrine: Negative for hot flashes.  Musculoskeletal: Negative for arthralgias.  Skin: Negative for itching and rash.  Neurological: Negative for dizziness,  extremity weakness and numbness.  Hematological: Negative for adenopathy. Does not bruise/bleed easily.  Psychiatric/Behavioral: Negative for depression. The patient is not nervous/anxious.   Breast: Denies any new nodularity, masses, tenderness, nipple changes, or nipple discharge.       PAST MEDICAL/SURGICAL HISTORY:  Past Medical History:  Diagnosis Date  . Anemia   . Arthritis   . Breast cancer ([place]) 1992  . Diabetes mellitus    type 2  . Duodenitis 01/18/2002  . Fainting spell   . GERD (gastroesophageal reflux disease)   . Heart murmur   . Hiatal hernia 08/08/2008, 01/18/2002  . History of pneumonia   . Hyperlipidemia   . Hypertension   . Lymphedema 2017   Right arm  . UTI (lower urinary tract infection)    Past Surgical History:  Procedure Laterality Date  . AXILLARY SURGERY     cyst removal, right  . MASTECTOMY  1992   right, with flap  . NM MYOCAR PERF WALL MOTION  06/11/2009   Protocol:Bruce, post stress EF58%, EKG negative for ischemia, low risk  . TONSILLECTOMY    . TRANSTHORACIC ECHOCARDIOGRAM  12/24/2009   LVEF =>55%, normal study     ALLERGIES:  No Known Allergies   CURRENT MEDICATIONS:  Outpatient Encounter Medications as of 08/27/2017  Medication Sig Note  . albuterol (PROVENTIL HFA;VENTOLIN HFA) 108 (90 Base) MCG/ACT inhaler Inhale 1-2 puffs into the lungs every 6 (six) hours as needed for wheezing or shortness of breath.   . AMBULATORY NON FORMULARY MEDICATION Place 0.0125 g rectally 2 (two) times daily. Medication Name: Nitroglycerin ointment .4098. Apply 2-3 times daily as needed.   Marland Kitchen amLODipine (  NORVASC) 10 MG tablet Take 1 tablet (10 mg total) by mouth daily.   Marland Kitchen aspirin 81 MG tablet Take 81 mg by mouth daily.   Marland Kitchen CALCIUM-MAGNESIUM-VITAMIN D PO Take 1 tablet by mouth daily.   . Cholecalciferol (VITAMIN D3) 2000 UNITS TABS Take 1 tablet by mouth daily. 04/10/2016: Sometimes takes 5000 per MD   . CRESTOR 20 MG tablet Take 1 tablet (20 mg total)  by mouth daily.   . Echinacea 400 MG CAPS Take 1 capsule by mouth daily.   Marland Kitchen JANUVIA 100 MG tablet Take 100 mg by mouth daily.    . lansoprazole (PREVACID) 15 MG capsule Take 1 capsule (15 mg total) by mouth daily before breakfast.   . metFORMIN (GLUCOPHAGE-XR) 500 MG 24 hr tablet Take 500 mg by mouth 2 (two) times daily before a meal.    . metoprolol succinate (TOPROL-XL) 100 MG 24 hr tablet TAKE 1 TABLET (100 MG TOTAL) BY MOUTH DAILY. TAKE WITH OR IMMEDIATELY FOLLOWING A MEAL.   . Multiple Vitamins-Minerals (CENTRUM SILVER ULTRA WOMENS) TABS Take 1 tablet by mouth daily.   . Omega 3 1000 MG CAPS Take 1 capsule by mouth daily.   . raloxifene (EVISTA) 60 MG tablet TAKE 1 TABLET DAILY   . valsartan-hydrochlorothiazide (DIOVAN-HCT) 320-25 MG tablet Take 1 tablet by mouth daily.   . [DISCONTINUED] sucralfate (CARAFATE) 1 GM/10ML suspension Take 1 g by mouth every 6 (six) hours as needed.    Facility-Administered Encounter Medications as of 08/27/2017  Medication  . triamcinolone acetonide (KENALOG) 10 MG/ML injection 10 mg     ONCOLOGIC FAMILY HISTORY:  Family History  Problem Relation Age of Onset  . Breast cancer Cousin   . Diabetes Brother   . Heart disease Mother   . Hypertension Mother   . Heart disease Father   . Hypertension Father   . Prostate cancer Brother   . Heart attack Maternal Grandmother   . Stroke Maternal Grandfather       SOCIAL HISTORY:  Social History   Socioeconomic History  . Marital status: Single    Spouse name: Not on file  . Number of children: 2  . Years of education: Not on file  . Highest education level: Not on file  Occupational History  . Occupation: retired  Scientific laboratory technician  . Financial resource strain: Not on file  . Food insecurity:    Worry: Not on file    Inability: Not on file  . Transportation needs:    Medical: Not on file    Non-medical: Not on file  Tobacco Use  . Smoking status: Former Smoker    Packs/day: 0.01    Years:  10.00    Pack years: 0.10  . Smokeless tobacco: Never Used  . Tobacco comment: quit 77 yo;   Substance and Sexual Activity  . Alcohol use: Yes    Alcohol/week: 0.0 oz    Comment: occ  . Drug use: No  . Sexual activity: Yes    Partners: Male    Birth control/protection: Condom    Comment: First sexual encounter age 89. Fewer than 5 partners in life time.  Lifestyle  . Physical activity:    Days per week: Not on file    Minutes per session: Not on file  . Stress: Not on file  Relationships  . Social connections:    Talks on phone: Not on file    Gets together: Not on file    Attends religious service: Not on file  Active member of club or organization: Not on file    Attends meetings of clubs or organizations: Not on file    Relationship status: Not on file  . Intimate partner violence:    Fear of current or ex partner: Not on file    Emotionally abused: Not on file    Physically abused: Not on file    Forced sexual activity: Not on file  Other Topics Concern  . Not on file  Social History Narrative  . Not on file       PHYSICAL EXAMINATION:  Vital Signs: Vitals:   08/27/17 1110  BP: (!) 156/69  Pulse: (!) 59  Resp: 18  Temp: 98.4 F (36.9 C)  SpO2: 100%   Filed Weights   08/27/17 1110  Weight: 166 lb 3.2 oz (75.4 kg)   General: Well-nourished, well-appearing female in no acute distress.  Unaccompanied today.   HEENT: Head is normocephalic.  Pupils equal and reactive to light. Conjunctivae clear without exudate.  Sclerae anicteric. Oral mucosa is pink, moist.  Oropharynx is pink without lesions or erythema.  Lymph: No cervical, supraclavicular, or infraclavicular lymphadenopathy noted on palpation.  Cardiovascular: Regular rate and rhythm.Marland Kitchen Respiratory: Clear to auscultation bilaterally. Chest expansion symmetric; breathing non-labored.  Breast Exam:  -Left breast: No appreciable masses on palpation. No skin redness, thickening, or peau d'orange appearance;  no nipple retraction or nipple discharge; -Right breast: right breast s/p mastectomy and reconstruction, no nodules, masses, skin changes -Axilla: No axillary adenopathy bilaterally.  GI: Abdomen soft and round; non-tender, non-distended. Bowel sounds normoactive. No hepatosplenomegaly.   GU: Deferred.  Neuro: No focal deficits. Steady gait.  Psych: Mood and affect normal and appropriate for situation.  MSK: No focal spinal tenderness to palpation, full range of motion in bilateral upper extremities Extremities: No edema. Skin: Warm and dry.  LABORATORY DATA:  None for this visit   DIAGNOSTIC IMAGING:  Most recent mammogram: at [facility]:  Donna May is a pleasant 77 y.o. female with history of breast cancer in 1992 with new lymphedema beginning in 2017.  [facility] and routine follow-up.   1. History of breast cancer:  Donna May is currently clinically and radiographically without evidence of disease or recurrence of breast cancer. She will be due for mammogram in 11/2017 her May orders this. She will return in 1 year for LTS follow up for continued surveillance and monitoring.  I encouraged her to call me with any questions or concerns before her next visit at the cancer center, and I would be happy to see her sooner, if needed.    2. Right arm lymphedema: This remains stable.  She will continue to wear her lymphedema sleeve.  I wrote her a new prescription for this today.    3. Bone health:  Given Donna May age, history of breast cancer, , she is at risk for bone demineralization. I will defer to her May regarding bone density testing and management.  She was given education on specific food and activities to promote bone health.  4. Cancer screening:  Due to Donna May history and her age, she should receive screening for skin cancers, colon cancer (if applicable). She was encouraged to follow-up with her May for  appropriate cancer screenings.   5. Health maintenance and wellness promotion: Donna May was encouraged to consume 5-7 servings of fruits and vegetables per day. She was also encouraged to engage in moderate  to vigorous May for 30 minutes per day most days of the week. She was instructed to limit her alcohol consumption and continue to abstain from tobacco use.    Dispo:  -Return to cancer center in one year for LTS follow up -Mammogram in 11/2018   A total of (30) minutes of face-to-face time was spent with this patient with greater than 50% of that time in counseling and care-coordination.   Gardenia Phlegm, [place] [phone]   Note: PRIMARY CARE PROVIDER Marletta Lor, [place] [phone]

## 2017-08-27 NOTE — Patient Instructions (Signed)
Bone Health Bones protect organs, store calcium, and anchor muscles. Good health habits, such as eating nutritious foods and exercising regularly, are important for maintaining healthy bones. They can also help to prevent a condition that causes bones to lose density and become weak and brittle (osteoporosis). Why is bone mass important? Bone mass refers to the amount of bone tissue that you have. The higher your bone mass, the stronger your bones. An important step toward having healthy bones throughout life is to have strong and dense bones during childhood. A young adult who has a high bone mass is more likely to have a high bone mass later in life. Bone mass at its greatest it is called peak bone mass. A large decline in bone mass occurs in older adults. In women, it occurs about the time of menopause. During this time, it is important to practice good health habits, because if more bone is lost than what is replaced, the bones will become less healthy and more likely to break (fracture). If you find that you have a low bone mass, you may be able to prevent osteoporosis or further bone loss by changing your diet and lifestyle. How can I find out if my bone mass is low? Bone mass can be measured with an X-ray test that is called a bone mineral density (BMD) test. This test is recommended for all women who are age 65 or older. It may also be recommended for men who are age 70 or older, or for people who are more likely to develop osteoporosis due to:  Having bones that break easily.  Having a long-term disease that weakens bones, such as kidney disease or rheumatoid arthritis.  Having menopause earlier than normal.  Taking medicine that weakens bones, such as steroids, thyroid hormones, or hormone treatment for breast cancer or prostate cancer.  Smoking.  Drinking three or more alcoholic drinks each day.  What are the nutritional recommendations for healthy bones? To have healthy bones, you  need to get enough of the right minerals and vitamins. Most nutrition experts recommend getting these nutrients from the foods that you eat. Nutritional recommendations vary from person to person. Ask your health care provider what is healthy for you. Here are some general guidelines. Calcium Recommendations Calcium is the most important (essential) mineral for bone health. Most people can get enough calcium from their diet, but supplements may be recommended for people who are at risk for osteoporosis. Good sources of calcium include:  Dairy products, such as low-fat or nonfat milk, cheese, and yogurt.  Dark green leafy vegetables, such as bok choy and broccoli.  Calcium-fortified foods, such as orange juice, cereal, bread, soy beverages, and tofu products.  Nuts, such as almonds.  Follow these recommended amounts for daily calcium intake:  Children, age 1?3: 700 mg.  Children, age 4?8: 1,000 mg.  Children, age 9?13: 1,300 mg.  Teens, age 14?18: 1,300 mg.  Adults, age 19?50: 1,000 mg.  Adults, age 51?70: ? Men: 1,000 mg. ? Women: 1,200 mg.  Adults, age 71 or older: 1,200 mg.  Pregnant and breastfeeding females: ? Teens: 1,300 mg. ? Adults: 1,000 mg.  Vitamin D Recommendations Vitamin D is the most essential vitamin for bone health. It helps the body to absorb calcium. Sunlight stimulates the skin to make vitamin D, so be sure to get enough sunlight. If you live in a cold climate or you do not get outside often, your health care provider may recommend that you take vitamin   D supplements. Good sources of vitamin D in your diet include:  Egg yolks.  Saltwater fish.  Milk and cereal fortified with vitamin D.  Follow these recommended amounts for daily vitamin D intake:  Children and teens, age 1?18: 600 international units.  Adults, age 50 or younger: 400-800 international units.  Adults, age 51 or older: 800-1,000 international units.  Other Nutrients Other nutrients  for bone health include:  Phosphorus. This mineral is found in meat, poultry, dairy foods, nuts, and legumes. The recommended daily intake for adult men and adult women is 700 mg.  Magnesium. This mineral is found in seeds, nuts, dark green vegetables, and legumes. The recommended daily intake for adult men is 400?420 mg. For adult women, it is 310?320 mg.  Vitamin K. This vitamin is found in green leafy vegetables. The recommended daily intake is 120 mg for adult men and 90 mg for adult women.  What type of physical activity is best for building and maintaining healthy bones? Weight-bearing and strength-building activities are important for building and maintaining peak bone mass. Weight-bearing activities cause muscles and bones to work against gravity. Strength-building activities increases muscle strength that supports bones. Weight-bearing and muscle-building activities include:  Walking and hiking.  Jogging and running.  Dancing.  Gym exercises.  Lifting weights.  Tennis and racquetball.  Climbing stairs.  Aerobics.  Adults should get at least 30 minutes of moderate physical activity on most days. Children should get at least 60 minutes of moderate physical activity on most days. Ask your health care provide what type of exercise is best for you. Where can I find more information? For more information, check out the following websites:  National Osteoporosis Foundation: http://nof.org/learn/basics  National Institutes of Health: http://www.niams.nih.gov/Health_Info/Bone/Bone_Health/bone_health_for_life.asp  This information is not intended to replace advice given to you by your health care provider. Make sure you discuss any questions you have with your health care provider. Document Released: 05/23/2003 Document Revised: 09/20/2015 Document Reviewed: 03/07/2014 Elsevier Interactive Patient Education  2018 [employer] Inc.  

## 2017-08-30 ENCOUNTER — Ambulatory Visit (INDEPENDENT_AMBULATORY_CARE_PROVIDER_SITE_OTHER): Payer: Medicare Other | Admitting: Orthotics

## 2017-08-30 DIAGNOSIS — M2042 Other hammer toe(s) (acquired), left foot: Secondary | ICD-10-CM

## 2017-08-30 DIAGNOSIS — E1151 Type 2 diabetes mellitus with diabetic peripheral angiopathy without gangrene: Secondary | ICD-10-CM

## 2017-08-30 DIAGNOSIS — M2011 Hallux valgus (acquired), right foot: Secondary | ICD-10-CM | POA: Diagnosis not present

## 2017-08-30 DIAGNOSIS — M2012 Hallux valgus (acquired), left foot: Secondary | ICD-10-CM | POA: Diagnosis not present

## 2017-08-30 DIAGNOSIS — E1040 Type 1 diabetes mellitus with diabetic neuropathy, unspecified: Secondary | ICD-10-CM

## 2017-08-30 DIAGNOSIS — E119 Type 2 diabetes mellitus without complications: Secondary | ICD-10-CM

## 2017-08-30 DIAGNOSIS — M2041 Other hammer toe(s) (acquired), right foot: Secondary | ICD-10-CM | POA: Diagnosis not present

## 2017-08-30 NOTE — Progress Notes (Signed)

## 2017-09-02 ENCOUNTER — Ambulatory Visit: Payer: Medicare Other | Admitting: Internal Medicine

## 2017-09-06 ENCOUNTER — Other Ambulatory Visit: Payer: Self-pay

## 2017-09-06 ENCOUNTER — Ambulatory Visit (INDEPENDENT_AMBULATORY_CARE_PROVIDER_SITE_OTHER): Payer: Medicare Other | Admitting: Internal Medicine

## 2017-09-06 ENCOUNTER — Encounter: Payer: Self-pay | Admitting: Internal Medicine

## 2017-09-06 VITALS — BP 120/62 | Temp 98.4°F | Wt 165.0 lb

## 2017-09-06 DIAGNOSIS — E7800 Pure hypercholesterolemia, unspecified: Secondary | ICD-10-CM | POA: Diagnosis not present

## 2017-09-06 DIAGNOSIS — I10 Essential (primary) hypertension: Secondary | ICD-10-CM | POA: Diagnosis not present

## 2017-09-06 DIAGNOSIS — E119 Type 2 diabetes mellitus without complications: Secondary | ICD-10-CM | POA: Diagnosis not present

## 2017-09-06 DIAGNOSIS — I701 Atherosclerosis of renal artery: Secondary | ICD-10-CM

## 2017-09-06 DIAGNOSIS — E0821 Diabetes mellitus due to underlying condition with diabetic nephropathy: Secondary | ICD-10-CM

## 2017-09-06 LAB — POCT GLYCOSYLATED HEMOGLOBIN (HGB A1C): HEMOGLOBIN A1C: 6.5 % — AB (ref 4.0–5.6)

## 2017-09-06 MED ORDER — GLUCOSE BLOOD VI STRP: ORAL_STRIP | 4 refills | Status: DC

## 2017-09-06 MED ORDER — TRIAMCINOLONE ACETONIDE 0.1 % EX CREA: 1.0000 "application " | TOPICAL_CREAM | Freq: Two times a day (BID) | CUTANEOUS | 0 refills | Status: DC

## 2017-09-06 MED ORDER — SITAGLIPTIN PHOSPHATE 100 MG PO TABS: 100.0000 mg | ORAL_TABLET | Freq: Every day | ORAL | 4 refills | Status: DC

## 2017-09-06 NOTE — Progress Notes (Signed)
Subjective:    Patient ID: Donna May, female    DOB: May 19, 1940, 77 y.o.   MRN: [id number]  HPI  Lab Results  Component Value Date   HGBA1C 6.5 04/26/2017   77 year old patient who is seen today for follow-up of type 2 diabetes.  She has a history of essential hypertension.  She is followed closely by oncology with history of right breast cancer. Doing quite well today no concerns or complaints she states that for the past couple days she has had some dry flaking skin involving primarily the soles of her feet.  She is quite concerned about PAD.  Denies any claudication  Past Medical History:  Diagnosis Date  . Anemia   . Arthritis   . Breast cancer ([place]) 1992  . Diabetes mellitus    type 2  . Duodenitis 01/18/2002  . Fainting spell   . GERD (gastroesophageal reflux disease)   . Heart murmur   . Hiatal hernia 08/08/2008, 01/18/2002  . History of pneumonia   . Hyperlipidemia   . Hypertension   . Lymphedema 2017   Right arm  . UTI (lower urinary tract infection)      Social History   Socioeconomic History  . Marital status: Single    Spouse name: Not on file  . Number of children: 2  . Years of education: Not on file  . Highest education level: Not on file  Occupational History  . Occupation: retired  Scientific laboratory technician  . Financial resource strain: Not on file  . Food insecurity:    Worry: Not on file    Inability: Not on file  . Transportation needs:    Medical: Not on file    Non-medical: Not on file  Tobacco Use  . Smoking status: Former Smoker    Packs/day: 0.01    Years: 10.00    Pack years: 0.10  . Smokeless tobacco: Never Used  . Tobacco comment: quit 77 yo;   Substance and Sexual Activity  . Alcohol use: Yes    Alcohol/week: 0.0 oz    Comment: occ  . Drug use: No  . Sexual activity: Yes    Partners: Male    Birth control/protection: Condom    Comment: First sexual encounter age 69. Fewer than 5 partners in life time.  Lifestyle  . Physical  activity:    Days per week: Not on file    Minutes per session: Not on file  . Stress: Not on file  Relationships  . Social connections:    Talks on phone: Not on file    Gets together: Not on file    Attends religious service: Not on file    Active member of club or organization: Not on file    Attends meetings of clubs or organizations: Not on file    Relationship status: Not on file  . Intimate partner violence:    Fear of current or ex partner: Not on file    Emotionally abused: Not on file    Physically abused: Not on file    Forced sexual activity: Not on file  Other Topics Concern  . Not on file  Social History Narrative  . Not on file    Past Surgical History:  Procedure Laterality Date  . AXILLARY SURGERY     cyst removal, right  . MASTECTOMY  1992   right, with flap  . NM MYOCAR PERF WALL MOTION  06/11/2009   Protocol:Bruce, post stress EF58%, EKG negative for ischemia,  low risk  . TONSILLECTOMY    . TRANSTHORACIC ECHOCARDIOGRAM  12/24/2009   LVEF =>55%, normal study    Family History  Problem Relation Age of Onset  . Breast cancer Cousin   . Diabetes Brother   . Heart disease Mother   . Hypertension Mother   . Heart disease Father   . Hypertension Father   . Prostate cancer Brother   . Heart attack Maternal Grandmother   . Stroke Maternal Grandfather     No Known Allergies  Current Outpatient Medications on File Prior to Visit  Medication Sig Dispense Refill  . albuterol (PROVENTIL HFA;VENTOLIN HFA) 108 (90 Base) MCG/ACT inhaler Inhale 1-2 puffs into the lungs every 6 (six) hours as needed for wheezing or shortness of breath. 1 Inhaler 3  . AMBULATORY NON FORMULARY MEDICATION Place 0.0125 g rectally 2 (two) times daily. Medication Name: Nitroglycerin ointment .3532. Apply 2-3 times daily as needed. 1 Tube 1  . amLODipine (NORVASC) 10 MG tablet Take 1 tablet (10 mg total) by mouth daily. 90 tablet 2  . aspirin 81 MG tablet Take 81 mg by mouth daily.     Marland Kitchen CALCIUM-MAGNESIUM-VITAMIN D PO Take 1 tablet by mouth daily.    . Cholecalciferol (VITAMIN D3) 2000 UNITS TABS Take 1 tablet by mouth daily.    . CRESTOR 20 MG tablet Take 1 tablet (20 mg total) by mouth daily. 90 tablet 0  . Echinacea 400 MG CAPS Take 1 capsule by mouth daily.    . lansoprazole (PREVACID) 15 MG capsule Take 1 capsule (15 mg total) by mouth daily before breakfast. 90 capsule 3  . metFORMIN (GLUCOPHAGE-XR) 500 MG 24 hr tablet Take 500 mg by mouth 2 (two) times daily before a meal.     . metoprolol succinate (TOPROL-XL) 100 MG 24 hr tablet TAKE 1 TABLET (100 MG TOTAL) BY MOUTH DAILY. TAKE WITH OR IMMEDIATELY FOLLOWING A MEAL. 90 tablet 1  . Multiple Vitamins-Minerals (CENTRUM SILVER ULTRA WOMENS) TABS Take 1 tablet by mouth daily.    . Omega 3 1000 MG CAPS Take 1 capsule by mouth daily.    . raloxifene (EVISTA) 60 MG tablet TAKE 1 TABLET DAILY 90 tablet 0  . valsartan-hydrochlorothiazide (DIOVAN-HCT) 320-25 MG tablet Take 1 tablet by mouth daily. 90 tablet 3   Current Facility-Administered Medications on File Prior to Visit  Medication Dose Route Frequency Provider Last Rate Last Dose  . triamcinolone acetonide (KENALOG) 10 MG/ML injection 10 mg  10 mg Other Once Harriet Masson, DPM        BP 120/62 (BP Location: Left Arm, Patient Position: Sitting, Cuff Size: Large)   Temp 98.4 F (36.9 C) (Oral)   Wt 165 lb (74.8 kg)   BMI 27.46 kg/m     Review of Systems  Constitutional: Negative.   HENT: Negative for congestion, dental problem, hearing loss, rhinorrhea, sinus pressure, sore throat and tinnitus.   Eyes: Negative for pain, discharge and visual disturbance.  Respiratory: Negative for cough and shortness of breath.   Cardiovascular: Negative for chest pain, palpitations and leg swelling.  Gastrointestinal: Negative for abdominal distention, abdominal pain, blood in stool, constipation, diarrhea, nausea and vomiting.  Genitourinary: Negative for difficulty  urinating, dysuria, flank pain, frequency, hematuria, pelvic pain, urgency, vaginal bleeding, vaginal discharge and vaginal pain.  Musculoskeletal: Negative for arthralgias, gait problem and joint swelling.  Skin: Positive for rash.  Neurological: Negative for dizziness, syncope, speech difficulty, weakness, numbness and headaches.  Hematological: Negative for adenopathy.  Psychiatric/Behavioral: Negative  for agitation, behavioral problems and dysphoric mood. The patient is not nervous/anxious.        Objective:   Physical Exam  Constitutional: She is oriented to person, place, and time. She appears well-developed and well-nourished.  Blood pressure 110/72  HENT:  Head: Normocephalic.  Right Ear: External ear normal.  Left Ear: External ear normal.  Mouth/Throat: Oropharynx is clear and moist.  Eyes: Pupils are equal, round, and reactive to light. Conjunctivae and EOM are normal.  Neck: Normal range of motion. Neck supple. No thyromegaly present.  Cardiovascular: Normal rate, regular rhythm and normal heart sounds.  The right dorsalis pedis pulse intact other pedal pulses not easily palpable  Pulmonary/Chest: Effort normal and breath sounds normal.  Abdominal: Soft. Bowel sounds are normal. She exhibits no mass. There is no tenderness.  Musculoskeletal: Normal range of motion.  Lymphadenopathy:    She has no cervical adenopathy.  Neurological: She is alert and oriented to person, place, and time.  Skin: Skin is warm and dry. No rash noted.  The skin on the soles of both feet dry and flaking.  No real intertriginous erythema or flaking  Psychiatric: She has a normal mood and affect. Her behavior is normal.          Assessment & Plan:   Diabetes mellitus.  Nice control with a hemoglobin A1c remaining at 6.5 Essential hypertension stable no change in therapy Dyslipidemia.  Continue statin therapy Nonspecific dry flaky skin involving the soles of her feet.  Will treat with  short-term triamcinolone and observe PAD.  Patient very concerned about vascular issues.  Will check ABI for baseline   the patient will follow up with a new provider in 3 to 4 months    Marletta Lor

## 2017-09-06 NOTE — Patient Instructions (Signed)
Limit your sodium (Salt) intake   Please check your hemoglobin A1c every 3-6 months    It is important that you exercise regularly, at least 20 minutes 3 to 4 times per week.  If you develop chest pain or shortness of breath seek  medical attention.  Please check your blood pressure on a regular basis.  If it is consistently greater than 150/90, please make an office appointment.  Please see your eye doctor yearly to check for diabetic eye damage

## 2017-09-07 ENCOUNTER — Ambulatory Visit (HOSPITAL_COMMUNITY)
Admission: RE | Admit: 2017-09-07 | Discharge: 2017-09-07 | Disposition: A | Discharge status: Home / Self Care | Payer: Medicare Other | Source: Ambulatory Visit | Attending: Vascular Surgery | Admitting: Vascular Surgery

## 2017-09-07 DIAGNOSIS — E1159 Type 2 diabetes mellitus with other circulatory complications: Secondary | ICD-10-CM | POA: Insufficient documentation

## 2017-09-07 DIAGNOSIS — I10 Essential (primary) hypertension: Secondary | ICD-10-CM

## 2017-09-07 DIAGNOSIS — E785 Hyperlipidemia, unspecified: Secondary | ICD-10-CM | POA: Insufficient documentation

## 2017-09-07 DIAGNOSIS — E0821 Diabetes mellitus due to underlying condition with diabetic nephropathy: Secondary | ICD-10-CM

## 2017-09-07 DIAGNOSIS — E7800 Pure hypercholesterolemia, unspecified: Secondary | ICD-10-CM | POA: Diagnosis not present

## 2017-09-08 ENCOUNTER — Telehealth: Payer: Self-pay | Admitting: *Deleted

## 2017-09-08 NOTE — Telephone Encounter (Signed)
Phone lines are down will try again tomorrow.

## 2017-09-08 NOTE — Telephone Encounter (Deleted)
Copied from [place] [id number]. Topic: Quick Communication - Office Called Patient >> Sep 08, 2017 10:13 AM Synthia Innocent wrote: Reason for CRM: Returning call to office

## 2017-09-08 NOTE — Telephone Encounter (Signed)
I do not see anything in Epic where we have called the patient.   Please advise Tillie Rung. Thanks.   Copied from [place] [id number]. Topic: Quick Communication - Office Called Patient >> Sep 08, 2017 10:13 AM Synthia Innocent wrote: Reason for CRM: Returning call to office

## 2017-09-08 NOTE — Telephone Encounter (Signed)
I called pt to go over lab results.

## 2017-09-09 NOTE — Progress Notes (Signed)
Left message for pt to return phone call. Pt CRM was created!

## 2017-09-09 NOTE — Telephone Encounter (Signed)
Spoke to pt about lab results for venous doppler. Pt notified and verbalized understanding.

## 2017-09-14 DIAGNOSIS — H04123 Dry eye syndrome of bilateral lacrimal glands: Secondary | ICD-10-CM | POA: Diagnosis not present

## 2017-09-14 DIAGNOSIS — Z961 Presence of intraocular lens: Secondary | ICD-10-CM | POA: Diagnosis not present

## 2017-09-14 DIAGNOSIS — E119 Type 2 diabetes mellitus without complications: Secondary | ICD-10-CM | POA: Diagnosis not present

## 2017-09-14 DIAGNOSIS — H43812 Vitreous degeneration, left eye: Secondary | ICD-10-CM | POA: Diagnosis not present

## 2017-09-23 ENCOUNTER — Ambulatory Visit (INDEPENDENT_AMBULATORY_CARE_PROVIDER_SITE_OTHER): Payer: Medicare Other | Admitting: Podiatry

## 2017-09-23 ENCOUNTER — Encounter: Payer: Self-pay | Admitting: Podiatry

## 2017-09-23 VITALS — BP 156/76 | HR 61 | Temp 98.9°F | Resp 16

## 2017-09-23 DIAGNOSIS — E1151 Type 2 diabetes mellitus with diabetic peripheral angiopathy without gangrene: Secondary | ICD-10-CM

## 2017-09-23 DIAGNOSIS — B351 Tinea unguium: Secondary | ICD-10-CM | POA: Diagnosis not present

## 2017-09-23 MED ORDER — KETOCONAZOLE 2 % EX CREA: TOPICAL_CREAM | CUTANEOUS | 0 refills | Status: DC

## 2017-09-24 ENCOUNTER — Ambulatory Visit: Payer: Medicare Other | Admitting: Cardiovascular Disease

## 2017-09-24 ENCOUNTER — Encounter

## 2017-09-27 ENCOUNTER — Telehealth: Payer: Self-pay | Admitting: Internal Medicine

## 2017-09-27 NOTE — Telephone Encounter (Signed)
Copied from [place] [id number]. Topic: Quick Communication - See Telephone Encounter >> Sep 27, 2017 10:08 AM Vernona Rieger wrote: CRM for notification. See Telephone encounter for: 09/27/17.  [facility] would like to know can the patient can have the generic of CRESTOR 20 MG tablet. She has zero copay on generic versus $100 copay on the original Call back @ [phone] reference number [id number]

## 2017-09-28 MED ORDER — ROSUVASTATIN CALCIUM 20 MG PO TABS: 20.0000 mg | ORAL_TABLET | Freq: Every day | ORAL | 0 refills | Status: DC

## 2017-09-28 NOTE — Telephone Encounter (Signed)
Spoke to pt and informed her that the generic Crestor has been sent to her mail service. Pt stated that she may have been charged for the Crestor already. Pt will call her pharmacy to make sure but she does not want me to cancel the generic. Pt will call back with updated information if something needed to be changed.

## 2017-10-04 NOTE — Progress Notes (Signed)
  Subjective:  Patient ID: Donna May, female    DOB: [birth year],  MRN: [id number]  Chief Complaint  Patient presents with  . debride    B/L nail trimming    77 y.o. female presents with the above complaint.  Here for routine foot care.  Last blood sugar was good.  Last A1c 6.4.  Last saw PCP Dr. Burnice Logan 3 weeks ago Objective:   General AA&O x3. Normal mood and affect.  Vascular Dorsalis pedis pulses present 1+ bilaterally  Posterior tibial pulses absent bilaterally  Capillary refill normal to all digits. Pedal hair growth normal.  Neurologic Epicritic sensation present bilaterally. Protective sensation with 5.07 monofilament  present bilaterally. Vibratory sensation present bilaterally.  Dermatologic No open lesions. Interspaces clear of maceration.  Normal skin temperature and turgor. Hyperkeratotic lesions: None bilaterally. Nails: brittle, onychomycosis, thickening, elongation *6. Evidence of prior bilat hallux nail removal.  Orthopedic: No history of amputation. MMT 5/5 in dorsiflexion, plantarflexion, inversion, and eversion. Normal lower extremity joint ROM without pain or crepitus HAV deformity bilateral, lesser digital contractures bilateral.   Assessment & Plan:  Patient was evaluated and treated and all questions answered.  Diabetes with PAD, Onychomycosis -Educated on diabetic footcare. Diabetic risk level 1 -Nails x6 debrided sharply and manually with large nail nipper and rotary burr.  Procedure: Nail Debridement Rationale: Patient meets criteria for routine foot care due to Class B findings. Type of Debridement: manual, sharp debridement. Instrumentation: Nail nipper, rotary burr. Number of Nails: 6    No follow-ups on file.

## 2017-10-07 ENCOUNTER — Encounter: Payer: Self-pay | Admitting: Cardiovascular Disease

## 2017-10-07 ENCOUNTER — Ambulatory Visit (INDEPENDENT_AMBULATORY_CARE_PROVIDER_SITE_OTHER): Payer: Medicare Other | Admitting: Cardiovascular Disease

## 2017-10-07 VITALS — BP 135/62 | HR 56 | Ht 65.0 in | Wt 165.0 lb

## 2017-10-07 DIAGNOSIS — M79605 Pain in left leg: Secondary | ICD-10-CM | POA: Diagnosis not present

## 2017-10-07 DIAGNOSIS — M79604 Pain in right leg: Secondary | ICD-10-CM | POA: Diagnosis not present

## 2017-10-07 DIAGNOSIS — I10 Essential (primary) hypertension: Secondary | ICD-10-CM | POA: Diagnosis not present

## 2017-10-07 DIAGNOSIS — E0821 Diabetes mellitus due to underlying condition with diabetic nephropathy: Secondary | ICD-10-CM

## 2017-10-07 DIAGNOSIS — E7800 Pure hypercholesterolemia, unspecified: Secondary | ICD-10-CM

## 2017-10-07 DIAGNOSIS — I701 Atherosclerosis of renal artery: Secondary | ICD-10-CM | POA: Diagnosis not present

## 2017-10-07 DIAGNOSIS — I2510 Atherosclerotic heart disease of native coronary artery without angina pectoris: Secondary | ICD-10-CM | POA: Diagnosis not present

## 2017-10-07 DIAGNOSIS — I2584 Coronary atherosclerosis due to calcified coronary lesion: Secondary | ICD-10-CM | POA: Diagnosis not present

## 2017-10-07 NOTE — Progress Notes (Signed)
Patient ID: Donna Donna May Donna May, female   DOB: [birth year], 77 y.o.   MRN: [id number]    Cardiology Office Note    Date:  10/07/2017   ID:  Donna Donna May, Donna May [birth year], MRN [id number]  PCP:  Marletta Lor, MD  Cardiologist:   Sanda Klein, MD   Chief Complaint  Patient presents with  . Annual follow-up visit    pt c/o occasional dizziness--if she turns too fast; no other Sx.    History of Present Illness:  Donna Donna May Donna May is a 77 y.o. female with long-standing systemic hypertension, nonobstructive bilateral renal artery disease, long-standing hyperlipidemia and type 2 diabetes mellitus with diabetic nephropathy.She quit smoking 27 years ago. She has a very strong family history of premature heart disease in both parents and one of her brothers.  Last year she underwent a pharmacological nuclear perfusion study with normal findings (she was unable to exercise on the treadmill due to knee problems).  She does not have any cardiac complaints.  She does complain of some pain in her legs and feet and wonders whether she has PAD.  She does not have dyspnea, angina or palpitations.  She has not had syncope.  She does not have edema on her lower extremities.  Her leg pain is not typical for intermittent claudication, but her description is a little vague.  Mostly she complains about arthritis, especially pains in her shoulders.  She has lymphedema in her right upper extremity, following lymph node dissection for breast cancer.  Her diabetes is well controlled with her most recent A1c of 6.4%.  She is no longer seeing Dr. Elyse Hsu.  She has not had her lipid profile checked.  Her most recent renal duplex ultrasound performed in March 2016 did not show any evidence of renal artery stenosis. She has preserved left ventricular systolic function, but has evidence of left ventricular hypertrophy by echocardiography and ECG, but has not had clinical manifestations of heart failure.   Past Medical  History:  Diagnosis Date  . Anemia   . Arthritis   . Breast cancer ([place]) 1992  . Diabetes mellitus    type 2  . Duodenitis 01/18/2002  . Fainting spell   . GERD (gastroesophageal reflux disease)   . Heart murmur   . Hiatal hernia 08/08/2008, 01/18/2002  . History of pneumonia   . Hyperlipidemia   . Hypertension   . Lymphedema 2017   Right arm  . UTI (lower urinary tract infection)     Past Surgical History:  Procedure Laterality Date  . AXILLARY SURGERY     cyst removal, right  . MASTECTOMY  1992   right, with flap  . NM MYOCAR PERF WALL MOTION  06/11/2009   Protocol:Bruce, post stress EF58%, EKG negative for ischemia, low risk  . TONSILLECTOMY    . TRANSTHORACIC ECHOCARDIOGRAM  12/24/2009   LVEF =>55%, normal study    Outpatient Medications Prior to Visit  Medication Sig Dispense Refill  . albuterol (PROVENTIL HFA;VENTOLIN HFA) 108 (90 Base) MCG/ACT inhaler Inhale 1-2 puffs into the lungs every 6 (six) hours as needed for wheezing or shortness of breath. 1 Inhaler 3  . AMBULATORY NON FORMULARY MEDICATION Place 0.0125 g rectally 2 (two) times daily. Medication Name: Nitroglycerin ointment .9983. Apply 2-3 times daily as needed. 1 Tube 1  . amLODipine (NORVASC) 10 MG tablet Take 1 tablet (10 mg total) by mouth daily. 90 tablet 2  . aspirin 81 MG tablet Take 81 mg by mouth daily.    Marland Kitchen  CALCIUM-MAGNESIUM-VITAMIN D PO Take 1 tablet by mouth daily.    . Cholecalciferol (VITAMIN D3) 2000 UNITS TABS Take 1 tablet by mouth daily.    . Echinacea 400 MG CAPS Take 1 capsule by mouth daily.    Marland Kitchen glucose blood (ACCU-CHEK AVIVA) test strip Use as instructed 100 each 4  . ketoconazole (NIZORAL) 2 % cream Apply 1 fingertip amount to each foot daily. 30 g 0  . lansoprazole (PREVACID) 15 MG capsule Take 1 capsule (15 mg total) by mouth daily before breakfast. 90 capsule 3  . metFORMIN (GLUCOPHAGE-XR) 500 MG 24 hr tablet Take 500 mg by mouth 2 (two) times daily before a meal.     . metoprolol  succinate (TOPROL-XL) 100 MG 24 hr tablet TAKE 1 TABLET (100 MG TOTAL) BY MOUTH DAILY. TAKE WITH OR IMMEDIATELY FOLLOWING A MEAL. 90 tablet 1  . Multiple Vitamins-Minerals (CENTRUM SILVER ULTRA WOMENS) TABS Take 1 tablet by mouth daily.    . Omega 3 1000 MG CAPS Take 1 capsule by mouth daily.    . raloxifene (EVISTA) 60 MG tablet TAKE 1 TABLET DAILY 90 tablet 0  . rosuvastatin (CRESTOR) 20 MG tablet Take 1 tablet (20 mg total) by mouth daily. 90 tablet 0  . sitaGLIPtin (JANUVIA) 100 MG tablet Take 1 tablet (100 mg total) by mouth daily. 90 tablet 4  . triamcinolone cream (KENALOG) 0.1 % Apply 1 application topically 2 (two) times daily. 30 g 0  . valsartan-hydrochlorothiazide (DIOVAN-HCT) 320-25 MG tablet Take 1 tablet by mouth daily. 90 tablet 3   Facility-Administered Medications Prior to Visit  Medication Dose Route Frequency Provider Last Rate Last Dose  . triamcinolone acetonide (KENALOG) 10 MG/ML injection 10 mg  10 mg Other Once Harriet Masson, DPM         Allergies:   Patient has no known allergies.   Social History   Socioeconomic History  . Marital status: Single    Spouse name: Not on file  . Number of children: 2  . Years of education: Not on file  . Highest education level: Not on file  Occupational History  . Occupation: retired  Scientific laboratory technician  . Financial resource strain: Not on file  . Food insecurity:    Worry: Not on file    Inability: Not on file  . Transportation needs:    Medical: Not on file    Non-medical: Not on file  Tobacco Use  . Smoking status: Former Smoker    Packs/day: 0.01    Years: 10.00    Pack years: 0.10  . Smokeless tobacco: Never Used  . Tobacco comment: quit 77 yo;   Substance and Sexual Activity  . Alcohol use: Yes    Alcohol/week: 0.0 oz    Comment: occ  . Drug use: No  . Sexual activity: Yes    Partners: Male    Birth control/protection: Condom    Comment: First sexual encounter age 78. Fewer than 5 partners in life time.    Lifestyle  . Physical activity:    Days per week: Not on file    Minutes per session: Not on file  . Stress: Not on file  Relationships  . Social connections:    Talks on phone: Not on file    Gets together: Not on file    Attends religious service: Not on file    Active member of club or organization: Not on file    Attends meetings of clubs or organizations: Not on file  Relationship status: Not on file  Other Topics Concern  . Not on file  Social History Narrative  . Not on file     Family History:  The patient's family history includes Breast cancer in her cousin; Diabetes in her brother; Heart attack in her maternal grandmother; Heart disease in her father and mother; Hypertension in her father and mother; Prostate cancer in her brother; Stroke in her maternal grandfather.   ROS:   Please see the history of present illness.    ROS All other systems reviewed and are negative.   PHYSICAL EXAM:   VS:  BP 135/62   Pulse (!) 56   Ht 5\' 5"  (1.651 m)   Wt 165 lb (74.8 kg)   BMI 27.46 kg/m     General: Alert, oriented x3, no distress, relatively lean, although mildly overweight by BMI Head: no evidence of trauma, PERRL, EOMI, no exophtalmos or lid lag, no myxedema, no xanthelasma; normal ears, nose and oropharynx Neck: normal jugular venous pulsations and no hepatojugular reflux; brisk carotid pulses without delay and no carotid bruits Chest: clear to auscultation, no signs of consolidation by percussion or palpation, normal fremitus, symmetrical and full respiratory excursions Cardiovascular: normal position and quality of the apical impulse, regular rhythm, normal first and second heart sounds, no murmurs, rubs or gallops Abdomen: no tenderness or distention, no masses by palpation, no abnormal pulsatility or arterial bruits, normal bowel sounds, no hepatosplenomegaly Extremities: no clubbing, cyanosis or edema; 2+ radial, ulnar and brachial pulses bilaterally; 2+ right  femoral, 1+ posterior tibial and dorsalis pedis pulses; 2+ left femoral, 1+ posterior tibial and dorsalis pedis pulses; no subclavian or femoral bruits Neurological: grossly nonfocal Psych: Normal mood and affect   Wt Readings from Last 3 Encounters:  10/07/17 165 lb (74.8 kg)  09/06/17 165 lb (74.8 kg)  08/27/17 166 lb 3.2 oz (75.4 kg)      Studies/Labs Reviewed:   EKG:  EKG is ordered today.  It shows sinus bradycardia with borderline criteria for LVH and nonspecific repolarization abnormalities.  QTc 411 ms  Recent Labs: 04/26/2017: ALT 11; BUN 21; Creatinine, Ser 1.20; Hemoglobin 12.9; Platelets 235; Potassium 3.9; Sodium 142; TSH 1.48   July 2017 total cholesterol 141, triglycerides 62, HDL 66, LDL 63, A1c 6.5%  ASSESSMENT:    1. Essential hypertension   2. Diabetes mellitus due to underlying condition with diabetic nephropathy, without long-term current use of insulin ([place])   3. HYPERCHOLESTEROLEMIA   4. Coronary artery calcification   5. Leg pain, bilateral      PLAN:  In order of problems listed above:  1. HTN: Her blood pressure is fairly well controlled.  Her most recent renal duplex arterial ultrasound did not confirm the diagnosis of renal artery stenosis.  No changes made to her medications. 2. DM: Good glycemic control on current medications. 3. HLP: Time to recheck her lipid profile.  She is on a highly active statin. 4. Coronary calcification: Recent normal perfusion scan 5. PAD?:  Symptoms are atypical.  Pulses are palpable but appear little diminished, albeit in a symmetrical fashion.  Check ABIs.    Medication Adjustments/Labs and Tests Ordered: Current medicines are reviewed at length with the patient today.  Concerns regarding medicines are outlined above.  Medication changes, Labs and Tests ordered today are listed in the Patient Instructions below. Patient Instructions  Medication Instructions:  Your physician recommends that you continue on your  current medications as directed. Please refer to the Current Medication list  given to you today.   Labwork: Your physician recommends that you return for lab work in: FASTING (cmet/lipid)   Testing/Procedures: Your physician has requested that you have an ankle brachial index (ABI). During this test an ultrasound and blood pressure cuff are used to evaluate the arteries that supply the arms and legs with blood. Allow thirty minutes for this exam. There are no restrictions or special instructions.   Follow-Up: Your physician wants you to follow-up in: 12 months with Dr. Sallyanne Kuster. You will receive a reminder letter in the mail two months in advance. If you don't receive a letter, please call our office to schedule the follow-up appointment.   Any Other Special Instructions Will Be Listed Below (If Applicable).     If you need a refill on your cardiac medications before your next appointment, please call your pharmacy.        Signed, Sanda Klein, MD  10/07/2017 1:02 PM    [facility] [address], [place], [place]  [address] Phone: [phone]; Fax: [phone]

## 2017-10-07 NOTE — Patient Instructions (Addendum)
Medication Instructions:  Your physician recommends that you continue on your current medications as directed. Please refer to the Current Medication list given to you today.   Labwork: Your physician recommends that you return for lab work in: FASTING (cmet/lipid)   Testing/Procedures: Your physician has requested that you have an ankle brachial index (ABI). During this test an ultrasound and blood pressure cuff are used to evaluate the arteries that supply the arms and legs with blood. Allow thirty minutes for this exam. There are no restrictions or special instructions.   Follow-Up: Your physician wants you to follow-up in: 12 months with Dr. Sallyanne Kuster. You will receive a reminder letter in the mail two months in advance. If you don't receive a letter, please call our office to schedule the follow-up appointment.   Any Other Special Instructions Will Be Listed Below (If Applicable).     If you need a refill on your cardiac medications before your next appointment, please call your pharmacy.

## 2017-10-08 LAB — COMPREHENSIVE METABOLIC PANEL
ALBUMIN: 4.2 g/dL (ref 3.5–4.8)
ALK PHOS: 56 IU/L (ref 39–117)
ALT: 12 IU/L (ref 0–32)
AST: 15 IU/L (ref 0–40)
Albumin/Globulin Ratio: 1.8 (ref 1.2–2.2)
BILIRUBIN TOTAL: 0.3 mg/dL (ref 0.0–1.2)
BUN/Creatinine Ratio: 15 (ref 12–28)
BUN: 18 mg/dL (ref 8–27)
CHLORIDE: 105 mmol/L (ref 96–106)
CO2: 26 mmol/L (ref 20–29)
Calcium: 9.5 mg/dL (ref 8.7–10.3)
Creatinine, Ser: 1.21 mg/dL — ABNORMAL HIGH (ref 0.57–1.00)
GFR calc Af Amer: 50 mL/min/{1.73_m2} — ABNORMAL LOW (ref >59)
GFR calc non Af Amer: 43 mL/min/{1.73_m2} — ABNORMAL LOW (ref >59)
GLOBULIN, TOTAL: 2.4 g/dL (ref 1.5–4.5)
Glucose: 89 mg/dL (ref 65–99)
Potassium: 4.2 mmol/L (ref 3.5–5.2)
Sodium: 144 mmol/L (ref 134–144)
TOTAL PROTEIN: 6.6 g/dL (ref 6.0–8.5)

## 2017-10-08 LAB — LIPID PANEL
Chol/HDL Ratio: 3.3 ratio (ref 0.0–4.4)
Cholesterol, Total: 209 mg/dL — ABNORMAL HIGH (ref 100–199)
HDL: 63 mg/dL (ref >39)
LDL Calculated: 133 mg/dL — ABNORMAL HIGH (ref 0–99)
TRIGLYCERIDES: 64 mg/dL (ref 0–149)
VLDL CHOLESTEROL CAL: 13 mg/dL (ref 5–40)

## 2017-10-18 ENCOUNTER — Other Ambulatory Visit: Payer: Self-pay | Admitting: Cardiovascular Disease

## 2017-10-18 ENCOUNTER — Ambulatory Visit (HOSPITAL_COMMUNITY)
Admission: RE | Admit: 2017-10-18 | Discharge: 2017-10-18 | Disposition: A | Discharge status: Home / Self Care | Payer: Medicare Other | Source: Ambulatory Visit | Attending: Cardiovascular Disease | Admitting: Cardiovascular Disease

## 2017-10-18 DIAGNOSIS — M79604 Pain in right leg: Secondary | ICD-10-CM | POA: Diagnosis not present

## 2017-10-18 DIAGNOSIS — M79605 Pain in left leg: Secondary | ICD-10-CM | POA: Diagnosis not present

## 2017-10-25 ENCOUNTER — Ambulatory Visit: Payer: Medicare Other | Admitting: Orthotics

## 2017-10-25 DIAGNOSIS — E119 Type 2 diabetes mellitus without complications: Secondary | ICD-10-CM

## 2017-10-25 DIAGNOSIS — E1151 Type 2 diabetes mellitus with diabetic peripheral angiopathy without gangrene: Secondary | ICD-10-CM

## 2017-10-25 DIAGNOSIS — M2011 Hallux valgus (acquired), right foot: Secondary | ICD-10-CM

## 2017-10-25 NOTE — Progress Notes (Signed)
Reorder [employer]fit black [place] 9.5 M.Marland Kitchen Confirm [id number]

## 2017-11-02 ENCOUNTER — Ambulatory Visit: Payer: Medicare Other | Admitting: Orthotics

## 2017-11-02 DIAGNOSIS — E119 Type 2 diabetes mellitus without complications: Secondary | ICD-10-CM

## 2017-11-02 DIAGNOSIS — M2011 Hallux valgus (acquired), right foot: Secondary | ICD-10-CM

## 2017-11-02 DIAGNOSIS — E1151 Type 2 diabetes mellitus with diabetic peripheral angiopathy without gangrene: Secondary | ICD-10-CM

## 2017-11-02 NOTE — Progress Notes (Signed)
Patient like fit of reordered shoes.

## 2017-12-02 ENCOUNTER — Other Ambulatory Visit: Payer: Self-pay | Admitting: Internal Medicine

## 2017-12-02 MED ORDER — METFORMIN HCL ER 500 MG PO TB24: 500.0000 mg | ORAL_TABLET | Freq: Two times a day (BID) | ORAL | 4 refills | Status: DC

## 2017-12-02 NOTE — Telephone Encounter (Signed)
Copied from [place] [id number]. Topic: Quick Communication - Rx Refill/Question >> Dec 02, 2017  9:45 AM Lysle Morales L, NT wrote: Medication: metFORMIN (GLUCOPHAGE-XR) 500 MG 24 hr tablet   (Agent: If no, request that the patient contact the pharmacy for the refill. (Agent: If yes, when and what did the pharmacy advise?  Preferred Pharmacy (with phone number or street name  [place], [place] - [address] [phone] (Phone) [phone] (Fax    Agent: Please be advised that RX refills may take up to 3 business days. We ask that you follow-up with your pharmacy.

## 2017-12-02 NOTE — Telephone Encounter (Signed)
Pt is wanting metformin Rx has not been filled since 2013 Okay for refill? Please advise

## 2017-12-02 NOTE — Telephone Encounter (Signed)
Glucophage XR refill  Historical med. Last Refill:06/01/11  Last OV: 04/26/17 PCP: Burnice Logan Pharmacy:[facility], [address] [place], [place]

## 2017-12-07 DIAGNOSIS — Z1231 Encounter for screening mammogram for malignant neoplasm of breast: Secondary | ICD-10-CM | POA: Diagnosis not present

## 2017-12-07 DIAGNOSIS — Z853 Personal history of malignant neoplasm of breast: Secondary | ICD-10-CM | POA: Diagnosis not present

## 2018-01-06 ENCOUNTER — Encounter: Payer: Self-pay | Admitting: Family Medicine

## 2018-01-06 ENCOUNTER — Ambulatory Visit (INDEPENDENT_AMBULATORY_CARE_PROVIDER_SITE_OTHER): Payer: Medicare Other | Admitting: Family Medicine

## 2018-01-06 VITALS — BP 132/60 | HR 68 | Temp 98.1°F | Wt 168.0 lb

## 2018-01-06 DIAGNOSIS — M1990 Unspecified osteoarthritis, unspecified site: Secondary | ICD-10-CM

## 2018-01-06 DIAGNOSIS — I890 Lymphedema, not elsewhere classified: Secondary | ICD-10-CM | POA: Diagnosis not present

## 2018-01-06 DIAGNOSIS — I701 Atherosclerosis of renal artery: Secondary | ICD-10-CM | POA: Diagnosis not present

## 2018-01-06 DIAGNOSIS — E119 Type 2 diabetes mellitus without complications: Secondary | ICD-10-CM | POA: Diagnosis not present

## 2018-01-06 DIAGNOSIS — I10 Essential (primary) hypertension: Secondary | ICD-10-CM | POA: Diagnosis not present

## 2018-01-06 NOTE — Patient Instructions (Signed)
How to Avoid Diabetes Mellitus Problems You can take action to prevent or slow down problems that are caused by diabetes (diabetes mellitus). Following your diabetes plan and taking care of yourself can reduce your risk of serious or life-threatening complications. Manage your diabetes  Follow instructions from your health care providers about managing your diabetes. Your diabetes may be managed by a team of health care providers who can teach you how to care for yourself and can answer questions that you have.  Educate yourself about your condition so you can make healthy choices about eating and physical activity.  Check your blood sugar (glucose) levels as often as directed. Your health care provider will help you decide how often to check your blood glucose level depending on your treatment goals and how well you are meeting them.  Ask your health care provider if you should take low-dose aspirin daily and what dose is recommended for you. Taking low-dose aspirin daily is recommended to help prevent cardiovascular disease. Do not use nicotine or tobacco Do not use any products that contain nicotine or tobacco, such as cigarettes and e-cigarettes. If you need help quitting, ask your health care provider. Nicotine raises your risk for diabetes problems. If you quit using nicotine:  You will lower your risk for heart attack, stroke, nerve disease, and kidney disease.  Your cholesterol and blood pressure may improve.  Your blood circulation will improve.  Keep your blood pressure under control To control your blood pressure:  Follow instructions from your health care provider about meal planning, exercise, and medicines.  Make sure your health care provider checks your blood pressure at every medical visit.  A blood pressure reading consists of two numbers. Generally, the goal is to keep your top number (systolic pressure) at or below 130, and your bottom number (diastolic pressure) at or  below 80. Your health care provider may recommend a lower target blood pressure. Your individualized target blood pressure is determined based on:  Your age.  Your medicines.  How long you have had diabetes.  Any other medical conditions you have.  Keep your cholesterol under control To control your cholesterol:  Follow instructions from your health care provider about meal planning, exercise, and medicines.  Have your cholesterol checked at least once a year.  You may be prescribed medicine to lower cholesterol (statin). If you are not taking a statin, ask your health care provider if you should be.  Controlling your cholesterol may:  Help prevent heart disease and stroke. These are the most common health problems for people with diabetes.  Improve your blood flow.  Schedule and keep yearly physical exams and eye exams Your health care provider will tell you how often you need medical visits depending on your diabetes management plan. Keep all follow-up visits as directed. This is important so possible problems can be identified early and complications can be avoided or treated.  Every visit with your health care provider should include measuring your: ? Weight. ? Blood pressure. ? Blood glucose control.  Your A1c (hemoglobin A1c) level should be checked: ? At least 2 times a year, if you are meeting your treatment goals. ? 4 times a year, if you are not meeting treatment goals or if your treatment goals have changed.  Your blood lipids (lipid profile) should be checked yearly. You should also be checked yearly for protein in your urine (urine microalbumin).  If you have type 1 diabetes, get an eye exam 3-5 years after you  are diagnosed, and then once a year after your first exam.  If you have type 2 diabetes, get an eye exam as soon as you are diagnosed, and then once a year after your first exam.  Keep your vaccines current It is recommended that you receive:  A flu  (influenza) vaccine every year.  A pneumonia (pneumococcal) vaccine and a hepatitis B vaccine. If you are age 6 or older, you may get the pneumonia vaccine as a series of two separate shots.  Ask your health care provider which other vaccines may be recommended. Take care of your feet Diabetes may cause you to have poor blood circulation to your legs and feet. Because of this, taking care of your feet is very important. Diabetes can cause:  The skin on the feet to get thinner, break more easily, and heal more slowly.  Nerve damage in your legs and feet, which results in decreased feeling. You may not notice minor injuries that could lead to serious problems.  To avoid foot problems:  Check your skin and feet every day for cuts, bruises, redness, blisters, or sores.  Schedule a foot exam with your health care provider once every year. This exam includes: ? Inspecting of the structure and skin of your feet. ? Checking the pulses and sensation in your feet.  Make sure that your health care provider performs a visual foot exam at every medical visit.  Take care of your teeth People with poorly controlled diabetes are more likely to have gum (periodontal) disease. Diabetes can make periodontal diseases harder to control. If not treated, periodontal diseases can lead to tooth loss. To prevent this:  Brush your teeth twice a day.  Floss at least once a day.  Visit your dentist 2 times a year.  Drink responsibly Limit alcohol intake to no more than 1 drink a day for nonpregnant women and 2 drinks a day for men. One drink equals 12 oz of beer, 5 oz of wine, or 1 oz of hard liquor. It is important to eat food when you drink alcohol to avoid low blood glucose (hypoglycemia). Avoid alcohol if you:  Have a history of alcohol abuse or dependence.  Are pregnant.  Have liver disease, pancreatitis, advanced neuropathy, or severe hypertriglyceridemia.  Lessen stress Living with diabetes can  be stressful. When you are experiencing stress, your blood glucose may be affected in two ways:  Stress hormones may cause your blood glucose to rise.  You may be distracted from taking good care of yourself.  Be aware of your stress level and make changes to help you manage challenging situations. To lower your stress levels:  Consider joining a support group.  Do planned relaxation or meditation.  Do a hobby that you enjoy.  Maintain healthy relationships.  Exercise regularly.  Work with your health care provider or a mental health professional.  Summary  You can take action to prevent or slow down problems that are caused by diabetes (diabetes mellitus). Following your diabetes plan and taking care of yourself can reduce your risk of serious or life-threatening complications.  Follow instructions from your health care providers about managing your diabetes. Your diabetes may be managed by a team of health care providers who can teach you how to care for yourself and can answer questions that you have.  Your health care provider will tell you how often you need medical visits depending on your diabetes management plan. Keep all follow-up visits as directed. This is important  so possible problems can be identified early and complications can be avoided or treated. This information is not intended to replace advice given to you by your health care provider. Make sure you discuss any questions you have with your health care provider. Document Released: 11/18/2010 Document Revised: 11/30/2015 Document Reviewed: 11/30/2015 Elsevier Interactive Patient Education  2018 [employer].  Managing Your Hypertension Hypertension is commonly called high blood pressure. This is when the force of your blood pressing against the walls of your arteries is too strong. Arteries are blood vessels that carry blood from your heart throughout your body. Hypertension forces the heart to work harder to pump  blood, and may cause the arteries to become narrow or stiff. Having untreated or uncontrolled hypertension can cause heart attack, stroke, kidney disease, and other problems. What are blood pressure readings? A blood pressure reading consists of a higher number over a lower number. Ideally, your blood pressure should be below 120/80. The first ("top") number is called the systolic pressure. It is a measure of the pressure in your arteries as your heart beats. The second ("bottom") number is called the diastolic pressure. It is a measure of the pressure in your arteries as the heart relaxes. What does my blood pressure reading mean? Blood pressure is classified into four stages. Based on your blood pressure reading, your health care provider may use the following stages to determine what type of treatment you need, if any. Systolic pressure and diastolic pressure are measured in a unit called mm Hg. Normal  Systolic pressure: below 240.  Diastolic pressure: below 80. Elevated  Systolic pressure: 973-532.  Diastolic pressure: below 80. Hypertension stage 1  Systolic pressure: 992-426.  Diastolic pressure: 83-41. Hypertension stage 2  Systolic pressure: 962 or above.  Diastolic pressure: 90 or above. What health risks are associated with hypertension? Managing your hypertension is an important responsibility. Uncontrolled hypertension can lead to:  A heart attack.  A stroke.  A weakened blood vessel (aneurysm).  Heart failure.  Kidney damage.  Eye damage.  Metabolic syndrome.  Memory and concentration problems.  What changes can I make to manage my hypertension? Hypertension can be managed by making lifestyle changes and possibly by taking medicines. Your health care provider will help you make a plan to bring your blood pressure within a normal range. Eating and drinking  Eat a diet that is high in fiber and potassium, and low in salt (sodium), added sugar, and fat. An  example eating plan is called the DASH (Dietary Approaches to Stop Hypertension) diet. To eat this way: ? Eat plenty of fresh fruits and vegetables. Try to fill half of your plate at each meal with fruits and vegetables. ? Eat whole grains, such as whole wheat pasta, brown rice, or whole grain bread. Fill about one quarter of your plate with whole grains. ? Eat low-fat diary products. ? Avoid fatty cuts of meat, processed or cured meats, and poultry with skin. Fill about one quarter of your plate with lean proteins such as fish, chicken without skin, beans, eggs, and tofu. ? Avoid premade and processed foods. These tend to be higher in sodium, added sugar, and fat.  Reduce your daily sodium intake. Most people with hypertension should eat less than 1,500 mg of sodium a day.  Limit alcohol intake to no more than 1 drink a day for nonpregnant women and 2 drinks a day for men. One drink equals 12 oz of beer, 5 oz of wine, or 1  oz of hard liquor. Lifestyle  Work with your health care provider to maintain a healthy body weight, or to lose weight. Ask what an ideal weight is for you.  Get at least 30 minutes of exercise that causes your heart to beat faster (aerobic exercise) most days of the week. Activities may include walking, swimming, or biking.  Include exercise to strengthen your muscles (resistance exercise), such as weight lifting, as part of your weekly exercise routine. Try to do these types of exercises for 30 minutes at least 3 days a week.  Do not use any products that contain nicotine or tobacco, such as cigarettes and e-cigarettes. If you need help quitting, ask your health care provider.  Control any long-term (chronic) conditions you have, such as high cholesterol or diabetes. Monitoring  Monitor your blood pressure at home as told by your health care provider. Your personal target blood pressure may vary depending on your medical conditions, your age, and other factors.  Have  your blood pressure checked regularly, as often as told by your health care provider. Working with your health care provider  Review all the medicines you take with your health care provider because there may be side effects or interactions.  Talk with your health care provider about your diet, exercise habits, and other lifestyle factors that may be contributing to hypertension.  Visit your health care provider regularly. Your health care provider can help you create and adjust your plan for managing hypertension. Will I need medicine to control my blood pressure? Your health care provider may prescribe medicine if lifestyle changes are not enough to get your blood pressure under control, and if:  Your systolic blood pressure is 130 or higher.  Your diastolic blood pressure is 80 or higher.  Take medicines only as told by your health care provider. Follow the directions carefully. Blood pressure medicines must be taken as prescribed. The medicine does not work as well when you skip doses. Skipping doses also puts you at risk for problems. Contact a health care provider if:  You think you are having a reaction to medicines you have taken.  You have repeated (recurrent) headaches.  You feel dizzy.  You have swelling in your ankles.  You have trouble with your vision. Get help right away if:  You develop a severe headache or confusion.  You have unusual weakness or numbness, or you feel faint.  You have severe pain in your chest or abdomen.  You vomit repeatedly.  You have trouble breathing. Summary  Hypertension is when the force of blood pumping through your arteries is too strong. If this condition is not controlled, it may put you at risk for serious complications.  Your personal target blood pressure may vary depending on your medical conditions, your age, and other factors. For most people, a normal blood pressure is less than 120/80.  Hypertension is managed by  lifestyle changes, medicines, or both. Lifestyle changes include weight loss, eating a healthy, low-sodium diet, exercising more, and limiting alcohol. This information is not intended to replace advice given to you by your health care provider. Make sure you discuss any questions you have with your health care provider. Document Released: 11/25/2011 Document Revised: 01/29/2016 Document Reviewed: 01/29/2016 Elsevier Interactive Patient Education  2018 [employer].  Lymphedema Lymphedema is swelling that is caused by the abnormal collection of lymph under the skin. Lymph is fluid from the tissues in your body that travels in the lymphatic system. This system is part  of the immune system and includes lymph nodes and lymph vessels. The lymph vessels collect and carry the excess fluid, fats, proteins, and wastes from the tissues of the body to the bloodstream. This system also works to clean and remove bacteria and waste products from the body. Lymphedema occurs when the lymphatic system is blocked. When the lymph vessels or lymph nodes are blocked or damaged, lymph does not drain properly, causing an abnormal buildup of lymph. This leads to swelling in the arms or legs. Lymphedema cannot be cured by medicines, but various methods can be used to help reduce the swelling. What are the causes? There are two types of lymphedema. Primary lymphedema is caused by the absence or abnormality of the lymph vessel at birth. Secondary lymphedema is more common. It occurs when the lymph vessel is damaged or blocked. Common causes of lymph vessel blockage include:  Skin infection, such as cellulitis.  Infection by parasites (filariasis).  Injury.  Cancer.  Radiation therapy.  Formation of scar tissue.  Surgery.  What are the signs or symptoms? Symptoms of this condition include:  Swelling of the arm or leg.  A heavy or tight feeling in the arm or leg.  Swelling of the feet, toes, or fingers. Shoes  or rings may fit more tightly than before.  Redness of the skin over the affected area.  Limited movement of the affected limb.  Sensitivity to touch or discomfort in the affected limb.  How is this diagnosed? This condition may be diagnosed with:  A physical exam.  Medical history.  Bioimpedance spectroscopy. In this test, painless electrical currents are used to measure fluid levels in your body.  Imaging tests, such as: ? Lymphoscintigraphy. In this test, a low dose of a radioactive substance is injected to trace the flow of lymph through the lymph vessels. ? MRI. ? CT scan. ? Duplex ultrasound. This test uses sound waves to produce images of the vessels and the blood flow on a screen. ? Lymphangiography. In this test, a contrast dye is injected into the lymph vessel to help show blockages.  How is this treated? Treatment for this condition may depend on the cause. Treatment may include:  Exercise. Certain exercises can help fluid move out of the affected limb.  Massage. Gentle massage of the affected limb can help move the fluid out of the area.  Compression. Various methods may be used to apply pressure to the affected limb in order to reduce the swelling. ? Wearing compression stockings or sleeves on the affected limb. ? Bandaging the affected limb. ? Using an external pump that is attached to a sleeve that alternates between applying pressure and releasing pressure.  Surgery. This is usually only done for severe cases. For example, surgery may be done if you have trouble moving the limb or if the swelling does not get better with other treatments.  If an underlying condition is causing the lymphedema, treatment for that condition is needed. For example, antibiotic medicines may be used to treat an infection. Follow these instructions at home: Activities  Exercise regularly as directed by your health care provider.  Do not sit with your legs crossed.  When  possible, keep the affected limb raised (elevated) above the level of your heart.  Avoid carrying things with an arm that is affected by lymphedema.  Remember that the affected area is more likely to become injured or infected.  Take these steps to help prevent infection: ? Keep the affected area clean  and dry. ? Protect your skin from cuts. For example, you should use gloves while cooking or gardening. Do not walk barefoot. If you shave the affected area, use an Copy. General instructions  Take medicines only as directed by your health care provider.  Eat a healthy diet that includes a lot of fruits and vegetables.  Do not wear tight clothes, shoes, or jewelry.  Do not use heating pads over the affected area.  Avoid having blood pressure checked on the affected limb.  Keep all follow-up visits as directed by your health care provider. This is important. Contact a health care provider if:  You continue to have swelling in your limb.  You have a fever.  You have a cut that does not heal.  You have redness or pain in the affected area.  You have new swelling in your limb that comes on suddenly.  You develop purplish spots or sores (lesions) on your limb. Get help right away if:  You have a skin rash.  You have chills or sweats.  You have shortness of breath. This information is not intended to replace advice given to you by your health care provider. Make sure you discuss any questions you have with your health care provider. Document Released: 12/28/2006 Document Revised: 11/07/2015 Document Reviewed: 02/07/2014 Elsevier Interactive Patient Education  [employer].

## 2018-01-06 NOTE — Progress Notes (Signed)
Subjective:    Patient ID: Donna May, female    DOB: 1941/01/18, 77 y.o.   MRN: [id number]  No chief complaint on file.   HPI Patient was seen today for f/u on ongoing conditions and TOC, previously seen by Dr. Burnice Logan.  HTN: -taking Norvasc 10 mg daily, toprol XL 100 mg daily, valsartan-hydrochlorothiazide 320-25 mg daily. -Patient does not exercising regularly -Not checking BP at home.  Lymphedema: -Status post breast resection for breast cancer -Right UV with chronic edema  Arthritis: -Patient is followed by orthopedics -Had steroid injections in bilateral knees in the past. -States does not need a total knee replacement replacement of cartilage? -Currently takes Tylenol as needed for pain.  Diabetes type 2: -Currently taking metformin 500 mg daily -Patient states she is unable to exercise given her arthritis at this time.  Past Medical History:  Diagnosis Date  . Anemia   . Arthritis   . Breast cancer ([place]) 1992  . Diabetes mellitus    type 2  . Duodenitis 01/18/2002  . Fainting spell   . GERD (gastroesophageal reflux disease)   . Heart murmur   . Hiatal hernia 08/08/2008, 01/18/2002  . History of pneumonia   . Hyperlipidemia   . Hypertension   . Lymphedema 2017   Right arm  . UTI (lower urinary tract infection)     No Known Allergies  ROS General: Denies fever, chills, night sweats, changes in weight, changes in appetite HEENT: Denies headaches, ear pain, changes in vision, rhinorrhea, sore throat CV: Denies CP, palpitations, SOB, orthopnea Pulm: Denies SOB, cough, wheezing GI: Denies abdominal pain, nausea, vomiting, diarrhea, constipation GU: Denies dysuria, hematuria, frequency, vaginal discharge Msk: Denies muscle cramps, joint pains  + lymphedema  RUE Neuro: Denies weakness, numbness, tingling Skin: Denies rashes, bruising Psych: Denies depression, anxiety, hallucinations    Objective:    Blood pressure 132/60, pulse 68, temperature 98.1  F (36.7 C), temperature source Oral, weight 168 lb (76.2 kg), SpO2 95 %.   Gen. Pleasant, well-nourished, in no distress, normal affect  HEENT: [place]/AT, face symmetric, conjunctiva clear, no scleral icterus, PERRLA, nares patent without drainage, pharynx without erythema or exudate. Lungs: no accessory muscle use, CTAB, no wheezes or rales Cardiovascular: RRR, no m/r/g, no peripheral edema Abdomen: BS present, soft, NT/ND, no hepatosplenomegaly. Musculoskeletal: Lymphedema of right upper extremity.  No deformities, no cyanosis or clubbing, normal tone Neuro:  A&Ox3, CN II-XII intact, normal gait   Wt Readings from Last 3 Encounters:  01/06/18 168 lb (76.2 kg)  10/07/17 165 lb (74.8 kg)  09/06/17 165 lb (74.8 kg)    Lab Results  Component Value Date   WBC 5.7 04/26/2017   HGB 12.9 04/26/2017   HCT 39.2 04/26/2017   PLT 235 04/26/2017   GLUCOSE 89 10/07/2017   CHOL 209 (H) 10/07/2017   TRIG 64 10/07/2017   HDL 63 10/07/2017   LDLCALC 133 (H) 10/07/2017   ALT 12 10/07/2017   AST 15 10/07/2017   NA 144 10/07/2017   K 4.2 10/07/2017   CL 105 10/07/2017   CREATININE 1.21 (H) 10/07/2017   BUN 18 10/07/2017   CO2 26 10/07/2017   TSH 1.48 04/26/2017   HGBA1C 6.5 (A) 09/06/2017   MICROALBUR 1.3 04/26/2017    Assessment/Plan:  Essential hypertension -Slightly elevated at this visit -Continue valsartan-hydrochlorothiazide 2020-25 mg daily, Toprol-XL 100 mg daily, Norvasc 10 mg daily -Pt encouraged to decrease sodium intake  Lymphedema -continue compression sleeve  Arthritis -Continue follow-up with orthopedics -Continue  Tylenol as needed  Controlled type 2 diabetes mellitus without complication, without long-term current use of insulin (HCC) -Continue metformin 500 mg daily -Discussed lifestyle modifications  Follow-up PRN  Grier Mitts, MD

## 2018-01-10 ENCOUNTER — Other Ambulatory Visit: Payer: Self-pay | Admitting: Cardiovascular Disease

## 2018-01-16 ENCOUNTER — Other Ambulatory Visit: Payer: Self-pay | Admitting: Obstetrics & Gynecology

## 2018-01-20 ENCOUNTER — Telehealth: Payer: Self-pay | Admitting: Cardiovascular Disease

## 2018-01-20 MED ORDER — VALSARTAN-HYDROCHLOROTHIAZIDE 160-12.5 MG PO TABS: 2.0000 | ORAL_TABLET | Freq: Every day | ORAL | 1 refills | Status: DC

## 2018-01-20 NOTE — Telephone Encounter (Signed)
Message received from pt mail order pharmacy [facility]. Diovan HCT 320-25mg  daily  is on back ordered. Pt pharmacy is requesting a Rx for Diovan HCT 160-12.5mg  2 tablet daily as a replacement. Rx sent for Diovan HCT 160-12.5mg   2 tablet daily #180 R-1

## 2018-01-20 NOTE — Telephone Encounter (Signed)
Pt c/o medication issue:  1. Name of Medication:  valsartan-hydrochlorothiazide (DIOVAN-HCT) 320-25 MG tablet  2. How are you currently taking this medication (dosage and times per day)? valsartan-hydrochlorothiazide (DIOVAN-HCT) 320-25 MG tablet  3. Are you having a reaction (difficulty breathing--STAT)?   4. What is your medication issue?  Medication is on back order. Lower strength is available and it would be covered. 160-12.5 mg for the same copay.  Call back # [phone] ref # [id number] if no response is received by of business today the prescription will be voided.

## 2018-01-27 ENCOUNTER — Telehealth: Payer: Self-pay | Admitting: Cardiovascular Disease

## 2018-01-27 MED ORDER — HYDROCHLOROTHIAZIDE 25 MG PO TABS: 25.0000 mg | ORAL_TABLET | Freq: Every day | ORAL | 1 refills | Status: DC

## 2018-01-27 MED ORDER — VALSARTAN 320 MG PO TABS: 320.0000 mg | ORAL_TABLET | Freq: Every day | ORAL | 1 refills | Status: DC

## 2018-01-27 NOTE — Telephone Encounter (Signed)
Valsartan 160/12.5mg  - take 2 tablets PO QD is not available. LM for patient that Rx will be sent for valsartan 320mg  and hctz 25mg 

## 2018-01-27 NOTE — Telephone Encounter (Signed)
medication zalsartan hroclorzide 320/25 is not in production anymore   *STAT* If patient is at the pharmacy, call can be transferred to refill team.   1. Which medications need to be refilled? (please list name of each medication and dose if known) **NO MORE zalsartan hroclorzide 320/25  is not in production anymore needs it replaced with something else per pt   2. Which pharmacy/location (including street and city if local pharmacy) is medication to be sent to? [facility]   3. Do they need a 30 day or 90 day supply? [place]

## 2018-04-01 ENCOUNTER — Telehealth: Payer: Self-pay

## 2018-04-01 NOTE — Telephone Encounter (Signed)
Received request for Prior auth for Prevacid from [facility]. Submitted via fax to [phone].

## 2018-04-07 ENCOUNTER — Other Ambulatory Visit: Payer: Self-pay

## 2018-04-07 MED ORDER — LANSOPRAZOLE 15 MG PO CPDR: 15.0000 mg | DELAYED_RELEASE_CAPSULE | Freq: Every day | ORAL | 0 refills | Status: DC

## 2018-04-07 NOTE — Telephone Encounter (Signed)
PA for Prevacid (lansoprazole) 15mg  qd approved through 04-02-19 [facility] [phone]

## 2018-04-25 ENCOUNTER — Ambulatory Visit (INDEPENDENT_AMBULATORY_CARE_PROVIDER_SITE_OTHER): Payer: Medicare Other

## 2018-04-25 ENCOUNTER — Encounter (HOSPITAL_COMMUNITY): Payer: Self-pay

## 2018-04-25 ENCOUNTER — Other Ambulatory Visit: Payer: Self-pay

## 2018-04-25 ENCOUNTER — Ambulatory Visit (HOSPITAL_COMMUNITY)
Admission: EM | Admit: 2018-04-25 | Discharge: 2018-04-25 | Disposition: A | Discharge status: Home / Self Care | Payer: Medicare Other | Attending: Urgent Care | Admitting: Urgent Care

## 2018-04-25 DIAGNOSIS — J069 Acute upper respiratory infection, unspecified: Secondary | ICD-10-CM

## 2018-04-25 DIAGNOSIS — B9789 Other viral agents as the cause of diseases classified elsewhere: Secondary | ICD-10-CM

## 2018-04-25 DIAGNOSIS — R05 Cough: Secondary | ICD-10-CM | POA: Diagnosis not present

## 2018-04-25 DIAGNOSIS — R059 Cough, unspecified: Secondary | ICD-10-CM

## 2018-04-25 DIAGNOSIS — R062 Wheezing: Secondary | ICD-10-CM

## 2018-04-25 MED ORDER — PROMETHAZINE-DM 6.25-15 MG/5ML PO SYRP: 5.0000 mL | ORAL_SOLUTION | Freq: Three times a day (TID) | ORAL | 0 refills | Status: DC | PRN

## 2018-04-25 MED ORDER — BENZONATATE 100 MG PO CAPS: 100.0000 mg | ORAL_CAPSULE | Freq: Three times a day (TID) | ORAL | 0 refills | Status: DC | PRN

## 2018-04-25 NOTE — ED Triage Notes (Signed)
Pt cc deep cough 4 days.

## 2018-04-25 NOTE — ED Provider Notes (Signed)
MRN: [id number] DOB: [birth year]  Subjective:   Donna May is a 78 y.o. female presenting for 4-day history of persistent moderate to severe productive hacking cough, coughing fits.  Symptoms started out with throat pain.  She has had pneumonia and has been really sick with this before, would like to make sure she does not have this.  Denies smoking cigarettes, history of asthma or COPD.  Patient is not currently undergoing any treatment for cancer.   Current Facility-Administered Medications:  .  triamcinolone acetonide (KENALOG) 10 MG/ML injection 10 mg, 10 mg, Other, Once, Harriet Masson, DPM  Current Outpatient Medications:  .  albuterol (PROVENTIL HFA;VENTOLIN HFA) 108 (90 Base) MCG/ACT inhaler, Inhale 1-2 puffs into the lungs every 6 (six) hours as needed for wheezing or shortness of breath., Disp: 1 Inhaler, Rfl: 3 .  AMBULATORY NON FORMULARY MEDICATION, Place 0.0125 g rectally 2 (two) times daily. Medication Name: Nitroglycerin ointment .0240. Apply 2-3 times daily as needed., Disp: 1 Tube, Rfl: 1 .  amLODipine (NORVASC) 10 MG tablet, TAKE 1 TABLET BY MOUTH EVERY DAY, Disp: 90 tablet, Rfl: 2 .  aspirin 81 MG tablet, Take 81 mg by mouth daily., Disp: , Rfl:  .  CALCIUM-MAGNESIUM-VITAMIN D PO, Take 1 tablet by mouth daily., Disp: , Rfl:  .  Cholecalciferol (VITAMIN D3) 2000 UNITS TABS, Take 1 tablet by mouth daily., Disp: , Rfl:  .  Echinacea 400 MG CAPS, Take 1 capsule by mouth daily., Disp: , Rfl:  .  glucose blood (ACCU-CHEK AVIVA) test strip, Use as instructed, Disp: 100 each, Rfl: 4 .  hydrochlorothiazide (HYDRODIURIL) 25 MG tablet, Take 1 tablet (25 mg total) by mouth daily., Disp: 90 tablet, Rfl: 1 .  ketoconazole (NIZORAL) 2 % cream, Apply 1 fingertip amount to each foot daily. (Patient taking differently: as needed. Apply 1 fingertip amount to each foot daily.), Disp: 30 g, Rfl: 0 .  lansoprazole (PREVACID) 15 MG capsule, Take 1 capsule (15 mg total) by mouth daily before  breakfast., Disp: 90 capsule, Rfl: 0 .  metFORMIN (GLUCOPHAGE-XR) 500 MG 24 hr tablet, Take 1 tablet (500 mg total) by mouth 2 (two) times daily before a meal., Disp: 90 tablet, Rfl: 4 .  metoprolol succinate (TOPROL-XL) 100 MG 24 hr tablet, TAKE 1 TABLET (100 MG TOTAL) BY MOUTH DAILY. TAKE WITH OR IMMEDIATELY FOLLOWING A MEAL., Disp: 90 tablet, Rfl: 1 .  Multiple Vitamins-Minerals (CENTRUM SILVER ULTRA WOMENS) TABS, Take 1 tablet by mouth daily., Disp: , Rfl:  .  Omega 3 1000 MG CAPS, Take 1 capsule by mouth daily., Disp: , Rfl:  .  raloxifene (EVISTA) 60 MG tablet, TAKE 1 TABLET DAILY, Disp: 90 tablet, Rfl: 0 .  rosuvastatin (CRESTOR) 20 MG tablet, Take 1 tablet (20 mg total) by mouth daily., Disp: 90 tablet, Rfl: 0 .  sitaGLIPtin (JANUVIA) 100 MG tablet, Take 1 tablet (100 mg total) by mouth daily., Disp: 90 tablet, Rfl: 4 .  triamcinolone cream (KENALOG) 0.1 %, Apply 1 application topically 2 (two) times daily. (Patient not taking: Reported on 01/06/2018), Disp: 30 g, Rfl: 0 .  valsartan (DIOVAN) 320 MG tablet, Take 1 tablet (320 mg total) by mouth daily., Disp: 90 tablet, Rfl: 1   No Known Allergies  Past Medical History:  Diagnosis Date  . Anemia   . Arthritis   . Breast cancer ([place]) 1992  . Diabetes mellitus    type 2  . Duodenitis 01/18/2002  . Fainting spell   . GERD (gastroesophageal reflux disease)   .  Heart murmur   . Hiatal hernia 08/08/2008, 01/18/2002  . History of pneumonia   . Hyperlipidemia   . Hypertension   . Lymphedema 2017   Right arm  . UTI (lower urinary tract infection)      Past Surgical History:  Procedure Laterality Date  . AXILLARY SURGERY     cyst removal, right  . MASTECTOMY  1992   right, with flap  . NM MYOCAR PERF WALL MOTION  06/11/2009   Protocol:Bruce, post stress EF58%, EKG negative for ischemia, low risk  . TONSILLECTOMY    . TRANSTHORACIC ECHOCARDIOGRAM  12/24/2009   LVEF =>55%, normal study    Review of Systems  Constitutional:  Positive for malaise/fatigue. Negative for chills and fever.  HENT: Positive for sore throat. Negative for ear pain and sinus pain.   Eyes: Negative for double vision and discharge.  Respiratory: Positive for cough, hemoptysis (Mild light streaks occasionally) and wheezing (With coughing fits). Negative for shortness of breath.   Cardiovascular: Negative for chest pain.  Gastrointestinal: Positive for abdominal pain (from coughing). Negative for nausea and vomiting.  Genitourinary: Negative for dysuria and hematuria.  Musculoskeletal: Negative for myalgias.  Skin: Negative for rash.  Neurological: Negative for dizziness and weakness.  Psychiatric/Behavioral: Negative for substance abuse.    Objective:   Vitals: BP (!) 178/70 (BP Location: Left Arm)   Pulse 77   Temp 99.9 F (37.7 C) (Tympanic)   Resp 18   Wt 164 lb (74.4 kg)   SpO2 99%   BMI 27.29 kg/m   BP recheck was 152/64 by PA-Antonette Hendricks at 18:30.  Physical Exam Constitutional:      General: She is not in acute distress.    Appearance: Normal appearance. She is well-developed. She is not ill-appearing, toxic-appearing or diaphoretic.  HENT:     Head: Normocephalic and atraumatic.     Nose: Nose normal.     Mouth/Throat:     Mouth: Mucous membranes are moist.  Eyes:     Extraocular Movements: Extraocular movements intact.     Pupils: Pupils are equal, round, and reactive to light.  Cardiovascular:     Rate and Rhythm: Normal rate and regular rhythm.     Pulses: Normal pulses.     Heart sounds: Normal heart sounds. No murmur. No friction rub. No gallop.   Pulmonary:     Effort: Pulmonary effort is normal. No respiratory distress.     Breath sounds: Normal breath sounds. No stridor. No wheezing, rhonchi or rales.  Skin:    General: Skin is warm and dry.     Findings: No rash.  Neurological:     Mental Status: She is alert and oriented to person, place, and time.  Psychiatric:        Mood and Affect: Mood normal.         Behavior: Behavior normal.        Thought Content: Thought content normal.    Dg Chest 2 View  Result Date: 04/25/2018 CLINICAL DATA:  Sore throat, cough EXAM: CHEST - 2 VIEW COMPARISON:  12/17/2015 FINDINGS: Heart and mediastinal contours are within normal limits. No focal opacities or effusions. No acute bony abnormality. IMPRESSION: No active cardiopulmonary disease. Electronically Signed   By: Rolm Baptise M.D.   On: 04/25/2018 19:11   Assessment and Plan :   Viral URI with cough  Cough  Wheezing  Likely viral in etiology d/t reassuring physical exam findings and negative chest x-ray. Advised supportive care, offered symptomatic relief.  Counseled patient on potential for adverse effects with medications prescribed today, patient verbalized understanding.  ER and return-to-clinic precautions discussed, patient verbalized understanding.  Can follow-up with PCP or back here if symptoms progress.   Jaynee Eagles, [place] 04/25/18 1919

## 2018-04-25 NOTE — Discharge Instructions (Addendum)
We will manage this as a viral syndrome. For sore throat or cough try using a honey-based tea. Use 3 teaspoons of honey with juice squeezed from half lemon. Place shaved pieces of ginger into 1/2-1 cup of water and warm over stove top. Then mix the ingredients and repeat every 4 hours as needed. Please take Tylenol 500mg  every 6 hours. Hydrate very well with at least 2 liters of water. Eat light meals such as soups to replenish electrolytes and soft fruits, veggies. Start an antihistamine like Zyrtec, Allegra or Claritin to help with a stuffy or runny nose.

## 2018-04-27 ENCOUNTER — Encounter: Payer: Medicare Other | Admitting: Obstetrics & Gynecology

## 2018-05-05 NOTE — Progress Notes (Deleted)
Subjective:   Donna May is a 78 y.o. female who presents for Medicare Annual (Subsequent) preventive examination.  Review of Systems:  No ROS.  Medicare Wellness Visit. Additional risk factors are reflected in the social history.    Sleep patterns: {SX; SLEEP PATTERNS:18802::"feels rested on waking","does not get up to void","gets up *** times nightly to void","sleeps *** hours nightly"}.   Home Safety/Smoke Alarms: Feels safe in home. Smoke alarms in place.  Living environment; residence and Firearm Safety: {Rehab home environment / accessibility:30080::"no firearms","firearms stored safely"}. Seat Belt Safety/Bike Helmet: Wears seat belt.   Female:   Pap- N/A d/t age      62- 11/2017, due 11/2018     Dexa scan- 06/2017, due 06/2019        CCS- 08/08/2008,      Objective:     Vitals: There were no vitals taken for this visit.  There is no height or weight on file to calculate BMI.  Advanced Directives 05/04/2017 04/10/2016 12/27/2015 12/04/2015 11/26/2015 11/26/2015  Does Patient Have a Medical Advance Directive? No No No No No No  Would patient like information on creating a medical advance directive? - - - Yes - Educational materials given - No - patient declined information    Tobacco Social History   Tobacco Use  Smoking Status Former Smoker  . Packs/day: 0.01  . Years: 10.00  . Pack years: 0.10  Smokeless Tobacco Never Used  Tobacco Comment   quit 78 yo;      Counseling given: Not Answered Comment: quit 79 yo;    Past Medical History:  Diagnosis Date  . Anemia   . Arthritis   . Breast cancer ([place]) 1992  . Diabetes mellitus    type 2  . Duodenitis 01/18/2002  . Fainting spell   . GERD (gastroesophageal reflux disease)   . Heart murmur   . Hiatal hernia 08/08/2008, 01/18/2002  . History of pneumonia   . Hyperlipidemia   . Hypertension   . Lymphedema 2017   Right arm  . UTI (lower urinary tract infection)    Past Surgical History:  Procedure  Laterality Date  . AXILLARY SURGERY     cyst removal, right  . MASTECTOMY  1992   right, with flap  . NM MYOCAR PERF WALL MOTION  06/11/2009   Protocol:Bruce, post stress EF58%, EKG negative for ischemia, low risk  . TONSILLECTOMY    . TRANSTHORACIC ECHOCARDIOGRAM  12/24/2009   LVEF =>55%, normal study   Family History  Problem Relation Age of Onset  . Breast cancer Cousin   . Diabetes Brother   . Heart disease Mother   . Hypertension Mother   . Heart disease Father   . Hypertension Father   . Prostate cancer Brother   . Heart attack Maternal Grandmother   . Stroke Maternal Grandfather    Social History   Socioeconomic History  . Marital status: Single    Spouse name: Not on file  . Number of children: 2  . Years of education: Not on file  . Highest education level: Not on file  Occupational History  . Occupation: retired  Scientific laboratory technician  . Financial resource strain: Not on file  . Food insecurity:    Worry: Not on file    Inability: Not on file  . Transportation needs:    Medical: Not on file    Non-medical: Not on file  Tobacco Use  . Smoking status: Former Smoker    Packs/day: 0.01  Years: 10.00    Pack years: 0.10  . Smokeless tobacco: Never Used  . Tobacco comment: quit 78 yo;   Substance and Sexual Activity  . Alcohol use: Yes    Alcohol/week: 0.0 standard drinks    Comment: occ  . Drug use: No  . Sexual activity: Yes    Partners: Male    Birth control/protection: Condom    Comment: First sexual encounter age 89. Fewer than 5 partners in life time.  Lifestyle  . Physical activity:    Days per week: Not on file    Minutes per session: Not on file  . Stress: Not on file  Relationships  . Social connections:    Talks on phone: Not on file    Gets together: Not on file    Attends religious service: Not on file    Active member of club or organization: Not on file    Attends meetings of clubs or organizations: Not on file    Relationship status:  Not on file  Other Topics Concern  . Not on file  Social History Narrative  . Not on file    Outpatient Encounter Medications as of 05/05/2018  Medication Sig  . albuterol (PROVENTIL HFA;VENTOLIN HFA) 108 (90 Base) MCG/ACT inhaler Inhale 1-2 puffs into the lungs every 6 (six) hours as needed for wheezing or shortness of breath.  . AMBULATORY NON FORMULARY MEDICATION Place 0.0125 g rectally 2 (two) times daily. Medication Name: Nitroglycerin ointment .3748. Apply 2-3 times daily as needed.  Marland Kitchen amLODipine (NORVASC) 10 MG tablet TAKE 1 TABLET BY MOUTH EVERY DAY  . aspirin 81 MG tablet Take 81 mg by mouth daily.  . benzonatate (TESSALON) 100 MG capsule Take 1-2 capsules (100-200 mg total) by mouth 3 (three) times daily as needed.  Marland Kitchen CALCIUM-MAGNESIUM-VITAMIN D PO Take 1 tablet by mouth daily.  . Cholecalciferol (VITAMIN D3) 2000 UNITS TABS Take 1 tablet by mouth daily.  . Echinacea 400 MG CAPS Take 1 capsule by mouth daily.  Marland Kitchen glucose blood (ACCU-CHEK AVIVA) test strip Use as instructed  . hydrochlorothiazide (HYDRODIURIL) 25 MG tablet Take 1 tablet (25 mg total) by mouth daily.  Marland Kitchen ketoconazole (NIZORAL) 2 % cream Apply 1 fingertip amount to each foot daily. (Patient taking differently: as needed. Apply 1 fingertip amount to each foot daily.)  . lansoprazole (PREVACID) 15 MG capsule Take 1 capsule (15 mg total) by mouth daily before breakfast.  . metFORMIN (GLUCOPHAGE-XR) 500 MG 24 hr tablet Take 1 tablet (500 mg total) by mouth 2 (two) times daily before a meal.  . metoprolol succinate (TOPROL-XL) 100 MG 24 hr tablet TAKE 1 TABLET (100 MG TOTAL) BY MOUTH DAILY. TAKE WITH OR IMMEDIATELY FOLLOWING A MEAL.  . Multiple Vitamins-Minerals (CENTRUM SILVER ULTRA WOMENS) TABS Take 1 tablet by mouth daily.  . Omega 3 1000 MG CAPS Take 1 capsule by mouth daily.  . promethazine-dextromethorphan (PROMETHAZINE-DM) 6.25-15 MG/5ML syrup Take 5 mLs by mouth 3 (three) times daily as needed for cough.  .  raloxifene (EVISTA) 60 MG tablet TAKE 1 TABLET DAILY  . rosuvastatin (CRESTOR) 20 MG tablet Take 1 tablet (20 mg total) by mouth daily.  . sitaGLIPtin (JANUVIA) 100 MG tablet Take 1 tablet (100 mg total) by mouth daily.  Marland Kitchen triamcinolone cream (KENALOG) 0.1 % Apply 1 application topically 2 (two) times daily. (Patient not taking: Reported on 01/06/2018)  . valsartan (DIOVAN) 320 MG tablet Take 1 tablet (320 mg total) by mouth daily.   Facility-Administered Encounter  Medications as of 05/05/2018  Medication  . triamcinolone acetonide (KENALOG) 10 MG/ML injection 10 mg    Activities of Daily Living No flowsheet data found.  Patient Care Team: [name], MD as PCP - General (Family Medicine) [name], [name] Dad, MD as Consulting Physician (Oncology) [name], [name], MD as Consulting Physician (Cardiology) [name], MD as Consulting Physician (Obstetrics and Gynecology) [name] [name], NP as Nurse Practitioner (Hematology and Oncology)    Assessment:   This is a routine wellness examination for [place]. Physical assessment deferred to PCP.   Exercise Activities and Dietary recommendations   Diet (meal preparation, eat out, water intake, caffeinated beverages, dairy products, fruits and vegetables): {Desc; diets:16563}       Goals    . patient     Take a trip; slow down work Can take a trip; bus trip; Sr Citizen trip [employer] and gets vacation trips planner      . Patient Stated     Will quit working!    . Patient Stated     When you feeling down;  Go shopping and not buy anything. [facility] the [facility];  [facility] [place] [facility] trip; give them a call        Fall Risk Fall Risk  05/04/2017 04/26/2017 04/20/2016 09/06/2014 04/28/2013  Falls in the past year? Yes Yes No No No  Comment bent over to pick up the paper and slipped and fell out of the car  - - - -  Number falls in past yr: 1 1 - - -  Injury with Fall? - No - - -    Follow up Education provided - - - -    Depression Screen PHQ 2/9 Scores 05/04/2017 04/26/2017 04/20/2016 09/06/2014  PHQ - 2 Score 0 0 1 0     Cognitive Function MMSE - Mini Mental State Exam 05/04/2017  Not completed: (No Data)        Immunization History  Administered Date(s) Administered  . Influenza, High Dose Seasonal PF 01/12/2014, 02/04/2015, 02/11/2017  . Influenza,inj,Quad PF,6+ Mos 12/27/2015  . Influenza-Unspecified 12/14/2012  . Pneumococcal Conjugate-13 01/12/2014  . Pneumococcal Polysaccharide-23 02/04/2015  . Zoster 05/02/2012    Qualifies for Shingles Vaccine?  Screening Tests Health Maintenance  Topic Date Due  . TETANUS/TDAP  01/31/1960  . OPHTHALMOLOGY EXAM  09/14/2017  . INFLUENZA VACCINE  10/14/2017  . HEMOGLOBIN A1C  03/08/2018  . FOOT EXAM  04/26/2018  . MAMMOGRAM  12/08/2018  . DEXA SCAN  Completed  . PNA vac Low Risk Adult  Completed        Plan:      I have personally reviewed and noted the following in the patient's chart:   . Medical and social history . Use of alcohol, tobacco or illicit drugs  . Current medications and supplements . Functional ability and status . Nutritional status . Physical activity . Advanced directives . List of other physicians . Vitals . Screenings to include cognitive, depression, and falls . Referrals and appointments  In addition, I have reviewed and discussed with patient certain preventive protocols, quality metrics, and best practice recommendations. A written personalized care plan for preventive services as well as general preventive health recommendations were provided to patient.     Alphia Moh, RN  05/05/2018

## 2018-05-16 ENCOUNTER — Telehealth: Payer: Self-pay

## 2018-05-16 ENCOUNTER — Ambulatory Visit (INDEPENDENT_AMBULATORY_CARE_PROVIDER_SITE_OTHER): Payer: Medicare Other

## 2018-05-16 VITALS — Ht 65.0 in | Wt 168.0 lb

## 2018-05-16 DIAGNOSIS — Z0000 Encounter for general adult medical examination without abnormal findings: Secondary | ICD-10-CM | POA: Diagnosis not present

## 2018-05-16 NOTE — Progress Notes (Signed)
Subjective:   Donna May is a 78 y.o. female who presents for Medicare Annual (Subsequent) preventive examination.  Review of Systems:  No ROS.  Medicare Wellness Visit. Additional risk factors are reflected in the social history.  Cardiac Risk Factors include: hypertension;dyslipidemia;sedentary lifestyle;advanced age (>20men, >50 women);diabetes mellitus Sleep patterns: no sleep issues.    Home Safety/Smoke Alarms: Feels safe in home. Smoke alarms in place.  Living environment; residence and Firearm Safety: not discussed d/t pt's other various concerns. Seat Belt Safety/Bike Helmet: Wears seat belt.   Female:   Pap- N/A d/t age      68-  11/2017, due 11/2018     Dexa scan-  06/2017, due 06/2019      CCS- 07/2008, due 07/2018.       Objective:     Vitals: There were no vitals taken for this visit.  There is no height or weight on file to calculate BMI.  Advanced Directives 05/16/2018 05/04/2017 04/10/2016 12/27/2015 12/04/2015 11/26/2015 11/26/2015  Does Patient Have a Medical Advance Directive? No No No No No No No  Would patient like information on creating a medical advance directive? No - Patient declined - - - Yes - Scientist, clinical (histocompatibility and immunogenetics) given - No - patient declined information    Tobacco Social History   Tobacco Use  Smoking Status Former Smoker  . Packs/day: 0.01  . Years: 10.00  . Pack years: 0.10  Smokeless Tobacco Never Used  Tobacco Comment   quit 78 yo;      Counseling given: Not Answered Comment: quit 78 yo;    Past Medical History:  Diagnosis Date  . Anemia   . Arthritis   . Breast cancer ([place]) 1992  . Diabetes mellitus    type 2  . Duodenitis 01/18/2002  . Fainting spell   . GERD (gastroesophageal reflux disease)   . Heart murmur   . Hiatal hernia 08/08/2008, 01/18/2002  . History of pneumonia   . Hyperlipidemia   . Hypertension   . Lymphedema 2017   Right arm  . UTI (lower urinary tract infection)    Past Surgical History:  Procedure  Laterality Date  . AXILLARY SURGERY     cyst removal, right  . MASTECTOMY  1992   right, with flap  . NM MYOCAR PERF WALL MOTION  06/11/2009   Protocol:[place], post stress EF58%, EKG negative for ischemia, low risk  . TONSILLECTOMY    . TRANSTHORACIC ECHOCARDIOGRAM  12/24/2009   LVEF =>55%, normal study   Family History  Problem Relation Age of Onset  . Breast cancer Cousin   . Diabetes Brother   . Heart disease Mother   . Hypertension Mother   . Heart disease Father   . Hypertension Father   . Prostate cancer Brother   . Heart attack Maternal Grandmother   . Stroke Maternal Grandfather    Social History   Socioeconomic History  . Marital status: Single    Spouse name: Not on file  . Number of children: 2  . Years of education: Not on file  . Highest education level: Not on file  Occupational History  . Occupation: retired  Scientific laboratory technician  . Financial resource strain: Not hard at all  . Food insecurity:    Worry: Never true    Inability: Never true  . Transportation needs:    Medical: No    Non-medical: No  Tobacco Use  . Smoking status: Former Smoker    Packs/day: 0.01    Years:  10.00    Pack years: 0.10  . Smokeless tobacco: Never Used  . Tobacco comment: quit 78 yo;   Substance and Sexual Activity  . Alcohol use: Yes    Alcohol/week: 1.0 standard drinks    Types: 1 Glasses of wine per week    Comment: occ  . Drug use: No  . Sexual activity: Yes    Partners: Male    Birth control/protection: Condom    Comment: First sexual encounter age 38. Fewer than 5 partners in life time.  Lifestyle  . Physical activity:    Days per week: 0 days    Minutes per session: 0 min  . Stress: Not at all  Relationships  . Social connections:    Talks on phone: Never    Gets together: Not on file    Attends religious service: More than 4 times per year    Active member of club or organization: No    Attends meetings of clubs or organizations: Never    Relationship  status: Not on file  Other Topics Concern  . Not on file  Social History Narrative  . Not on file    Outpatient Encounter Medications as of 05/16/2018  Medication Sig  . albuterol (PROVENTIL HFA;VENTOLIN HFA) 108 (90 Base) MCG/ACT inhaler Inhale 1-2 puffs into the lungs every 6 (six) hours as needed for wheezing or shortness of breath.  Marland Kitchen amLODipine (NORVASC) 10 MG tablet TAKE 1 TABLET BY MOUTH EVERY DAY  . aspirin 81 MG tablet Take 81 mg by mouth daily as needed.   Marland Kitchen CALCIUM-MAGNESIUM-VITAMIN D PO Take 1 tablet by mouth daily.  . Cholecalciferol (VITAMIN D3) 2000 UNITS TABS Take 1 tablet by mouth daily as needed.   . Echinacea 400 MG CAPS Take 1 capsule by mouth daily.  Marland Kitchen glucose blood (ACCU-CHEK AVIVA) test strip Use as instructed  . hydrochlorothiazide (HYDRODIURIL) 25 MG tablet Take 1 tablet (25 mg total) by mouth daily.  Marland Kitchen ketoconazole (NIZORAL) 2 % cream Apply 1 fingertip amount to each foot daily. (Patient taking differently: as needed. Apply 1 fingertip amount to each foot daily.)  . lansoprazole (PREVACID) 15 MG capsule Take 1 capsule (15 mg total) by mouth daily before breakfast.  . metFORMIN (GLUCOPHAGE-XR) 500 MG 24 hr tablet Take 1 tablet (500 mg total) by mouth 2 (two) times daily before a meal.  . metoprolol succinate (TOPROL-XL) 100 MG 24 hr tablet TAKE 1 TABLET (100 MG TOTAL) BY MOUTH DAILY. TAKE WITH OR IMMEDIATELY FOLLOWING A MEAL.  . Multiple Vitamins-Minerals (CENTRUM SILVER ULTRA WOMENS) TABS Take 1 tablet by mouth daily as needed.   . Omega 3 1000 MG CAPS Take 1 capsule by mouth daily.  . promethazine-dextromethorphan (PROMETHAZINE-DM) 6.25-15 MG/5ML syrup Take 5 mLs by mouth 3 (three) times daily as needed for cough.  . raloxifene (EVISTA) 60 MG tablet TAKE 1 TABLET DAILY  . rosuvastatin (CRESTOR) 20 MG tablet Take 1 tablet (20 mg total) by mouth daily.  . sitaGLIPtin (JANUVIA) 100 MG tablet Take 1 tablet (100 mg total) by mouth daily.  Marland Kitchen triamcinolone cream (KENALOG)  0.1 % Apply 1 application topically 2 (two) times daily.  . valsartan (DIOVAN) 320 MG tablet Take 1 tablet (320 mg total) by mouth daily.  . AMBULATORY NON FORMULARY MEDICATION Place 0.0125 g rectally 2 (two) times daily. Medication Name: Nitroglycerin ointment .2025. Apply 2-3 times daily as needed. (Patient not taking: Reported on 05/16/2018)  . [DISCONTINUED] benzonatate (TESSALON) 100 MG capsule Take 1-2 capsules (100-200  mg total) by mouth 3 (three) times daily as needed. (Patient not taking: Reported on 05/16/2018)   Facility-Administered Encounter Medications as of 05/16/2018  Medication  . triamcinolone acetonide (KENALOG) 10 MG/ML injection 10 mg    Activities of Daily Living In your present state of health, do you have any difficulty performing the following activities: 05/16/2018  Hearing? N  Vision? N  Difficulty concentrating or making decisions? N  Walking or climbing stairs? Y  Comment d/t bilateral knee pain  Dressing or bathing? N  Doing errands, shopping? N  Preparing Food and eating ? N  Using the Toilet? N  Managing your Medications? N  Managing your Finances? N  Housekeeping or managing your Housekeeping? Y  Comment resources provided  Some recent data might be hidden    Patient Care Team: [name], MD as PCP - General (Family Medicine) [name], [name] Dad, MD as Consulting Physician (Oncology) [name], [name], MD as Consulting Physician (Cardiology) [name], MD as Consulting Physician (Obstetrics and Gynecology) [name], [name], NP as Nurse Practitioner (Hematology and Oncology) [facility]    Assessment:   This is a routine wellness examination for [place]. Physical assessment deferred to PCP.   Exercise Activities and Dietary recommendations Current Exercise Habits: The patient does not participate in regular exercise at present, Exercise limited by: orthopedic condition(s);cardiac condition(s) Diet (meal  preparation, eat out, water intake, caffeinated beverages, dairy products, fruits and vegetables): not asked. Pt. did volunteer that she has been eating more oatmeal recently. Pt. has not been checking her sugars routinely recently, needs new meter she says. When author offered for Dr. Volanda Napoleon to write an rx, pt.stated she used to have endocrinologist office provide a free meter, and would rather go through them. Pt., however, does not routinely see an endocrinologist at this time. Pt. Complained that she used to have a routine a1c check and diabetes work-up every 4 months, but has not been monitored in awhile. Pt. stated she was considering transferring care to a different practice, maybe one closer to home. Author stated she would make Dr. Volanda Napoleon aware, and offered to make pt. a physical and/or follow-up appointment to address concerns, but pt. Declined. Dr. Volanda Napoleon to be made aware.   Goals    . patient     Take a trip; slow down work Can take a trip; bus trip; Sr Citizen trip [employer] and gets vacation trips planner      . Patient Stated     Will quit working!    . Patient Stated     When you feeling down;  Go shopping and not buy anything. Visit the [facility];  [place] a trip; give them a call     . Patient Stated     Retire fully!        Fall Risk Fall Risk  05/16/2018 05/04/2017 04/26/2017 04/20/2016 09/06/2014  Falls in the past year? 0 Yes Yes No No  Comment - bent over to pick up the paper and slipped and fell out of the car  - - -  Number falls in past yr: - 1 1 - -  Injury with Fall? - - No - -  Risk for fall due to : Impaired mobility;Impaired vision;History of fall(s) - - - -  Follow up Education provided Education provided - - -    Depression Screen PHQ 2/9 Scores 05/16/2018 05/04/2017 04/26/2017 04/20/2016  PHQ - 2 Score - 0 0  1  Exception Documentation Other- indicate reason in comment box - - -  Not completed pt was more interested  in talking about her diabetic routine care, and concern with needed labwork.  - - -     Cognitive Function MMSE - Mini Mental State Exam 05/04/2017  Not completed: (No Data)       Ad8 score reviewed for issues:  Issues making decisions: no  Less interest in hobbies / activities: no  Repeats questions, stories (family complaining): no  Trouble using ordinary gadgets (microwave, computer, phone):no  Forgets the month or year: no  Mismanaging finances: no  Remembering appts: no  Daily problems with thinking and/or memory: yes Ad8 score is= 1    Immunization History  Administered Date(s) Administered  . Influenza, High Dose Seasonal PF 01/12/2014, 02/04/2015, 02/11/2017  . Influenza,inj,Quad PF,6+ Mos 12/27/2015  . Influenza-Unspecified 12/14/2012  . Pneumococcal Conjugate-13 01/12/2014  . Pneumococcal Polysaccharide-23 02/04/2015  . Zoster 05/02/2012    Qualifies for Shingles Vaccine? Yes, education provided. Advised to go to local pharmacy.  Screening Tests Health Maintenance  Topic Date Due  . HEMOGLOBIN A1C  03/08/2018  . FOOT EXAM  04/26/2018  . INFLUENZA VACCINE  06/14/2018 (Originally 10/14/2017)  . OPHTHALMOLOGY EXAM  09/16/2018 (Originally 09/14/2017)  . TETANUS/TDAP  10/14/2018 (Originally 01/31/1960)  . MAMMOGRAM  12/08/2018  . DEXA SCAN  Completed  . PNA vac Low Risk Adult  Completed         Plan:     Colonoscopy due on or after 08/10/2018, schedule as planned.   Will ask Dr. Volanda Napoleon to refill requested medications.  Follow-up with orthopedist regarding knee, wrist, and R shoulder pain. Let us know if you'd like Dr. Volanda Napoleon input on pain control.  Refer to resources provided for home assistance. [facility] of [employer] is a good Merchandiser, retail for home needs, support groups, durable medical equipment resources, etc.   [facility]  [address]  [place], [place] [address]   Telephone:  [phone]  Fax:  [phone]  Email:  [email or web address]  Call [place] about coverage regarding Tdap vaccine. Consider getting shingles vaccine (shingrix) at local pharmacy. If you'd like to get flu shot here, just call to schedule a nurse visit appointment.  Refer to specialist resources provided for endocrinology, etc. I have personally reviewed and noted the following in the patient's chart:   . Medical and social history . Use of alcohol, tobacco or illicit drugs  . Current medications and supplements . Functional ability and status . Nutritional status . Physical activity . Advanced directives . List of other physicians . Hospitalizations, surgeries, and ER visits in previous 12 months . Vitals . Screenings to include cognitive, depression, and falls . Referrals and appointments  In addition, I have reviewed and discussed with patient certain preventive protocols, quality metrics, and best practice recommendations. A written personalized care plan for preventive services as well as general preventive health recommendations were provided to patient.     Alphia Moh, RN  05/16/2018

## 2018-05-16 NOTE — Patient Instructions (Addendum)
Colonoscopy due on or after 08/10/2018, schedule as planned.   Will ask Dr. Volanda Napoleon to refill requested medications.  Follow-up with orthopedist regarding knee, wrist, and R shoulder pain. Let us know if you'd like Dr. Volanda Napoleon input on pain control.  Refer to resources provided for home assistance. [facility] of [employer] is a good Merchandiser, retail for home needs, support groups, durable medical equipment resources, etc.   [facility]  [address]  [place], [place] [address]   Telephone:  [phone]  Fax:  [phone]  Email:  [email or web address]  Call [facility] about coverage regarding Tdap vaccine. Consider getting shingles vaccine (shingrix) at local pharmacy. If you'd like to get flu shot here, just call to schedule a nurse visit appointment.  Refer to specialist resources provided for endocrinology, etc.    Donna May , Thank you for taking time to come for your Medicare Wellness Visit. I appreciate your ongoing commitment to your health goals. Please review the following plan we discussed and let me know if I can assist you in the future.   These are the goals we discussed: Goals    . patient     Take a trip; slow down work Can take a trip; bus trip; Sr Citizen trip [employer] and gets vacation trips planner      . Patient Stated     Will quit working!    . Patient Stated     When you feeling down;  Go shopping and not buy anything. Visit the [facility];  [place] on [place] a trip; give them a call     . Patient Stated     Retire fully!        This is a list of the screening recommended for you and due dates:  Health Maintenance  Topic Date Due  . Hemoglobin A1C  03/08/2018  . Complete foot exam   04/26/2018  . Flu Shot  06/14/2018*  . Eye exam for diabetics  09/16/2018*  . Tetanus Vaccine  10/14/2018*  . Mammogram  12/08/2018  . DEXA scan (bone density measurement)   Completed  . Pneumonia vaccines  Completed  *Topic was postponed. The date shown is not the original due date.     Recombinant Zoster (Shingles) Vaccine, RZV: What You Need to Know 1. Why get vaccinated? Shingles (also called herpes zoster, or just zoster) is a painful skin rash, often with blisters. Shingles is caused by the varicella zoster virus, the same virus that causes chickenpox. After you have chickenpox, the virus stays in your body and can cause shingles later in life. You can't catch shingles from another person. However, a person who has never had chickenpox (or chickenpox vaccine) could get chickenpox from someone with shingles. A shingles rash usually appears on one side of the face or body and heals within 2 to 4 weeks. Its main symptom is pain, which can be severe. Other symptoms can include fever, headache, chills, and upset stomach. Very rarely, a shingles infection can lead to pneumonia, hearing problems, blindness, brain inflammation (encephalitis), or death. For about 1 person in 5, severe pain can continue even long after the rash has cleared up. This long-lasting pain is called post-herpetic neuralgia (PHN). Shingles is far more common in people 39 years of age and older than in younger people, and the risk increases with age. It is also more common in people whose immune system is weakened because of a disease  such as cancer, or by drugs such as steroids or chemotherapy. At least 1 million people a year in the [place] get shingles. 2. Shingles vaccine (recombinant) Recombinant shingles vaccine was approved by FDA in 2017 for the prevention of shingles. In clinical trials, it was more than 90% effective in preventing shingles. It can also reduce the likelihood of PHN. Two doses, 2 to 6 months apart, are recommended for adults 104 and older. This vaccine is also recommended for people who have already gotten the live shingles vaccine (Zostavax). There is no live virus in  this vaccine. 3. Some people should not get this vaccine Tell your vaccine provider if you:  Have any severe, life-threatening allergies. A person who has ever had a life-threatening allergic reaction after a dose of recombinant shingles vaccine, or has a severe allergy to any component of this vaccine, may be advised not to be vaccinated. Ask your health care provider if you want information about vaccine components.  Are pregnant or breastfeeding. There is not much information about use of recombinant shingles vaccine in pregnant or nursing women. Your healthcare provider might recommend delaying vaccination.  Are not feeling well. If you have a mild illness, such as a cold, you can probably get the vaccine today. If you are moderately or severely ill, you should probably wait until you recover. Your doctor can advise you. 4. Risks of a vaccine reaction With any medicine, including vaccines, there is a chance of reactions. After recombinant shingles vaccination, a person might experience:  Pain, redness, soreness, or swelling at the site of the injection  Headache, muscle aches, fever, shivering, fatigue In clinical trials, most people got a sore arm with mild or moderate pain after vaccination, and some also had redness and swelling where they got the shot. Some people felt tired, had muscle pain, a headache, shivering, fever, stomach pain, or nausea. About 1 out of 6 people who got recombinant zoster vaccine experienced side effects that prevented them from doing regular activities. Symptoms went away on their own in about 2 to 3 days. Side effects were more common in younger people. You should still get the second dose of recombinant zoster vaccine even if you had one of these reactions after the first dose. Other things that could happen after this vaccine:  People sometimes faint after medical procedures, including vaccination. Sitting or lying down for about 15 minutes can help prevent  fainting and injuries caused by a fall. Tell your provider if you feel dizzy or have vision changes or ringing in the ears.  Some people get shoulder pain that can be more severe and longer-lasting than routine soreness that can follow injections. This happens very rarely.  Any medication can cause a severe allergic reaction. Such reactions to a vaccine are estimated at about 1 in a million doses, and would happen within a few minutes to a few hours after the vaccination. As with any medicine, there is a very remote chance of a vaccine causing a serious injury or death. The safety of vaccines is always being monitored. For more information, visit: http://www.aguilar.org/ 5. What if there is a serious problem? What should I look for?  Look for anything that concerns you, such as signs of a severe allergic reaction, very high fever, or unusual behavior. Signs of a severe allergic reaction can include hives, swelling of the face and throat, difficulty breathing, a fast heartbeat, dizziness, and weakness. These would usually start a few minutes to a few  hours after the vaccination. What should I do?  If you think it is a severe allergic reaction or other emergency that can't wait, call 9-1-1 or get to the nearest hospital. Otherwise, call your health care provider. Afterward, the reaction should be reported to the Vaccine Adverse Event Reporting System (VAERS). Your doctor should file this report, or you can do it yourself through the VAERS website at www.vaers.SamedayNews.es, or by calling 458-485-2128. VAERS does not give medical advice. 6. How can I learn more?  Ask your health care provider. He or she can give you the vaccine package insert or suggest other sources of information.  Call your local or state health department.  Contact the Centers for Disease Control and Prevention (CDC): ? Call 361-079-5245 (1-800-CDC-INFO) or ? Visit CDC's vaccines website at http://hunter.com/ CDC  Vaccine Information Statement Recombinant Zoster Vaccine (04/27/2016) This information is not intended to replace advice given to you by your health care provider. Make sure you discuss any questions you have with your health care provider. Document Released: 05/12/2016 Document Revised: 10/06/2017 Document Reviewed: 10/06/2017 Elsevier Interactive Patient Education  2019 Elsevier Inc.   Diabetes Mellitus and [place] care is an important part of your health, especially when you have diabetes. Diabetes may cause you to have problems because of poor blood flow (circulation) to your feet and legs, which can cause your skin to:  Become thinner and drier.  Break more easily.  Heal more slowly.  Peel and crack. You may also have nerve damage (neuropathy) in your legs and feet, causing decreased feeling in them. This means that you may not notice minor injuries to your feet that could lead to more serious problems. Noticing and addressing any potential problems early is the best way to prevent future foot problems. How to care for your feet Foot hygiene  Wash your feet daily with warm water and mild soap. Do not use hot water. Then, pat your feet and the areas between your toes until they are completely dry. Do not soak your feet as this can dry your skin.  Trim your toenails straight across. Do not dig under them or around the cuticle. File the edges of your nails with an emery board or nail file.  Apply a moisturizing lotion or petroleum jelly to the skin on your feet and to dry, brittle toenails. Use lotion that does not contain alcohol and is unscented. Do not apply lotion between your toes. Shoes and socks  Wear clean socks or stockings every day. Make sure they are not too tight. Do not wear knee-high stockings since they may decrease blood flow to your legs.  Wear shoes that fit properly and have enough cushioning. Always look in your shoes before you put them on to be sure there  are no objects inside.  To break in new shoes, wear them for just a few hours a day. This prevents injuries on your feet. Wounds, scrapes, corns, and calluses  Check your feet daily for blisters, cuts, bruises, sores, and redness. If you cannot see the bottom of your feet, use a mirror or ask someone for help.  Do not cut corns or calluses or try to remove them with medicine.  If you find a minor scrape, cut, or break in the skin on your feet, keep it and the skin around it clean and dry. You may clean these areas with mild soap and water. Do not clean the area with peroxide, alcohol, or iodine.  If you  have a wound, scrape, corn, or callus on your foot, look at it several times a day to make sure it is healing and not infected. Check for: ? Redness, swelling, or pain. ? Fluid or blood. ? Warmth. ? Pus or a bad smell. General instructions  Do not cross your legs. This may decrease blood flow to your feet.  Do not use heating pads or hot water bottles on your feet. They may burn your skin. If you have lost feeling in your feet or legs, you may not know this is happening until it is too late.  Protect your feet from hot and cold by wearing shoes, such as at the beach or on hot pavement.  Schedule a complete foot exam at least once a year (annually) or more often if you have foot problems. If you have foot problems, report any cuts, sores, or bruises to your health care provider immediately. Contact a health care provider if:  You have a medical condition that increases your risk of infection and you have any cuts, sores, or bruises on your feet.  You have an injury that is not healing.  You have redness on your legs or feet.  You feel burning or tingling in your legs or feet.  You have pain or cramps in your legs and feet.  Your legs or feet are numb.  Your feet always feel cold.  You have pain around a toenail. Get help right away if:  You have a wound, scrape, corn, or  callus on your foot and: ? You have pain, swelling, or redness that gets worse. ? You have fluid or blood coming from the wound, scrape, corn, or callus. ? Your wound, scrape, corn, or callus feels warm to the touch. ? You have pus or a bad smell coming from the wound, scrape, corn, or callus. ? You have a fever. ? You have a red line going up your leg. Summary  Check your feet every day for cuts, sores, red spots, swelling, and blisters.  Moisturize feet and legs daily.  Wear shoes that fit properly and have enough cushioning.  If you have foot problems, report any cuts, sores, or bruises to your health care provider immediately.  Schedule a complete foot exam at least once a year (annually) or more often if you have foot problems. This information is not intended to replace advice given to you by your health care provider. Make sure you discuss any questions you have with your health care provider. Document Released: 02/28/2000 Document Revised: 04/14/2017 Document Reviewed: 04/03/2016 Elsevier Interactive Patient Education  2019 [place] for Type 2 Diabetes  A screening test for type 2 diabetes (type 2 diabetes mellitus) is a blood test to measure your blood sugar (glucose) level. This test is done to check for early signs of diabetes, before you develop symptoms.  Type 2 diabetes is a long-term (chronic) disease. In type 2 diabetes, one or both of these problems may be present:  The pancreas does not make enough of a hormone called insulin.  Cells in the body do not respond properly to insulin that the body makes (insulin resistance). Normally, insulin allows blood sugar (glucose) to enter cells in the body. The cells use glucose for energy. Insulin resistance or lack of insulin causes excess glucose to build up in the blood instead of going into cells. This results in high blood glucose levels (hyperglycemia), which can cause many complications. You may be  screened  for type 2 diabetes as part of your regular health care, especially if you have a high risk for diabetes. Screening can help to identify type 2 diabetes at its early stage (prediabetes). Identifying and treating prediabetes may delay or prevent the development of type 2 diabetes. What are the risk factors for type 2 diabetes? The following factors may make you more likely to develop type 2 diabetes:  Having a parent or sibling (first-degree relative) who has diabetes.  Being overweight or obese.  Being of American-Indian, African-American, Hispanic, Latino, Asian, or [place] descent.  Not getting enough exercise (having a sedentary lifestyle).  Being older than age 68.  Having a history of diabetes during pregnancy (gestational diabetes).  Having low levels of good cholesterol (HDL-C) or high levels of blood fats (triglycerides).  Having high blood glucose in a previous blood test.  Having high blood pressure.  Having certain diseases or conditions that may be caused by insulin resistance, including: ? Acanthosis nigricans. This is a condition that causes dark skin on the neck, armpits, and groin. ? Polycystic ovary syndrome (PCOS). ? Cardiovascular heart disease. Who should be screened for type 2 diabetes? Adults  Adults age 62 and older. These adults should be screened once every three years.  Adults who are younger than age 13, are overweight, and have one other risk factor. These adults should be screened once every three years.  Adults who have normal blood glucose levels and two or more risk factors. These adults may be screened once every year (annually).  Women who have had gestational diabetes in the past. These women should be screened once every three years.  Pregnant women who have risk factors. These women should be screened at their first prenatal visit and again between weeks 24 and 28 of pregnancy. Children and adolescents  Children and  adolescents should be screened for type 2 diabetes if they are overweight and have any of the following risk factors: ? A family history of type 2 diabetes. ? Being a member of a high-risk ethnic group. ? Signs of insulin resistance or conditions that are associated with insulin resistance. ? A mother who had gestational diabetes while pregnant with him or her.  Screening should be done at least once every three years, starting at age 63 or at the onset of puberty, whichever comes first. Your health care provider or your child's health care provider may recommend having a screening more or less often. What happens during screening? During screening, your health care provider may ask questions about:  Your health and your risk factors, including your activity level and any medical conditions that you have.  The health of your first-degree relatives.  Past pregnancies, if this applies. Your health care provider will also do a physical exam, including a blood pressure measurement and blood tests. There are four blood tests that can be used to screen for type 2 diabetes. You may have one or more of the following:  A fasting blood glucose (FBG) test. You will not be allowed to eat (you will fast) for 8 hours or more before a blood sample is taken.  A random blood glucose test. This test checks your blood glucose at any time of the day regardless of when you ate.  An oral glucose tolerance test (OGTT). This test measures your blood glucose at two times: ? After you have not eaten (have fasted) overnight. This is your baseline glucose level. ? Two hours after you drink a glucose-containing beverage.  An A1c (hemoglobin A1c) blood test. This test provides information about blood glucose control over the previous 2-3 months. What do the results mean? Your test results are a measurement of how much glucose is in your blood. Normal blood glucose levels mean that you do not have diabetes or  prediabetes. High blood glucose levels may mean that you have prediabetes or diabetes. Depending on the results, other tests may be needed to confirm the diagnosis. You may be diagnosed with type 2 diabetes if:  Your FBG level is 126 mg/dL (7.0 mmol/L) or higher.  Your random blood glucose level is 200 mg/dL (11.1 mmol/L) or higher.  Your A1c level is 6.5% or higher.  Your OGTT result is higher than 200 mg/dL (11.1 mmol/L). These blood tests may be repeated to confirm your diagnosis. Talk with your health care provider about what your results mean. Summary  A screening test for type 2 diabetes (type 2 diabetes mellitus) is a blood test to measure your blood sugar (glucose) level.  Know what your risk factors are for developing type 2 diabetes.  If you are at risk, get screening tests as often as told by your health care provider.  Screening may help you identify type 2 diabetes at its early stage (prediabetes). Identifying and treating prediabetes may delay or prevent the development of type 2 diabetes. This information is not intended to replace advice given to you by your health care provider. Make sure you discuss any questions you have with your health care provider. Document Released: 12/27/2008 Document Revised: 10/20/2016 Document Reviewed: 04/04/2016 Elsevier Interactive Patient Education  2019 [place] Maintenance, Female Adopting a healthy lifestyle and getting preventive care can go a long way to promote health and wellness. Talk with your health care provider about what schedule of regular examinations is right for you. This is a good chance for you to check in with your provider about disease prevention and staying healthy. In between checkups, there are plenty of things you can do on your own. Experts have done a lot of research about which lifestyle changes and preventive measures are most likely to keep you healthy. Ask your health care provider for more  information. Weight and diet Eat a healthy diet  Be sure to include plenty of vegetables, fruits, low-fat dairy products, and lean protein.  Do not eat a lot of foods high in solid fats, added sugars, or salt.  Get regular exercise. This is one of the most important things you can do for your health. ? Most adults should exercise for at least 150 minutes each week. The exercise should increase your heart rate and make you sweat (moderate-intensity exercise). ? Most adults should also do strengthening exercises at least twice a week. This is in addition to the moderate-intensity exercise. Maintain a healthy weight  Body mass index (BMI) is a measurement that can be used to identify possible weight problems. It estimates body fat based on height and weight. Your health care provider can help determine your BMI and help you achieve or maintain a healthy weight.  For females 54 years of age and older: ? A BMI below 18.5 is considered underweight. ? A BMI of 18.5 to 24.9 is normal. ? A BMI of 25 to 29.9 is considered overweight. ? A BMI of 30 and above is considered obese. Watch levels of cholesterol and blood lipids  You should start having your blood tested for lipids and cholesterol at 80 years of age, then  have this test every 5 years.  You may need to have your cholesterol levels checked more often if: ? Your lipid or cholesterol levels are high. ? You are older than 80 years of age. ? You are at high risk for heart disease. Cancer screening Lung Cancer  Lung cancer screening is recommended for adults 34-61 years old who are at high risk for lung cancer because of a history of smoking.  A yearly low-dose CT scan of the lungs is recommended for people who: ? Currently smoke. ? Have quit within the past 15 years. ? Have at least a 30-pack-year history of smoking. A pack year is smoking an average of one pack of cigarettes a day for 1 year.  Yearly screening should continue until  it has been 15 years since you quit.  Yearly screening should stop if you develop a health problem that would prevent you from having lung cancer treatment. Breast Cancer  Practice breast self-awareness. This means understanding how your breasts normally appear and feel.  It also means doing regular breast self-exams. Let your health care provider know about any changes, no matter how small.  If you are in your 20s or 30s, you should have a clinical breast exam (CBE) by a health care provider every 1-3 years as part of a regular health exam.  If you are 45 or older, have a CBE every year. Also consider having a breast X-ray (mammogram) every year.  If you have a family history of breast cancer, talk to your health care provider about genetic screening.  If you are at high risk for breast cancer, talk to your health care provider about having an MRI and a mammogram every year.  Breast cancer gene (BRCA) assessment is recommended for women who have family members with BRCA-related cancers. BRCA-related cancers include: ? Breast. ? Ovarian. ? Tubal. ? Peritoneal cancers.  Results of the assessment will determine the need for genetic counseling and BRCA1 and BRCA2 testing. Cervical Cancer Your health care provider may recommend that you be screened regularly for cancer of the pelvic organs (ovaries, uterus, and vagina). This screening involves a pelvic examination, including checking for microscopic changes to the surface of your cervix (Pap test). You may be encouraged to have this screening done every 3 years, beginning at age 19.  For women ages 57-65, health care providers may recommend pelvic exams and Pap testing every 3 years, or they may recommend the Pap and pelvic exam, combined with testing for human papilloma virus (HPV), every 5 years. Some types of HPV increase your risk of cervical cancer. Testing for HPV may also be done on women of any age with unclear Pap test  results.  Other health care providers may not recommend any screening for nonpregnant women who are considered low risk for pelvic cancer and who do not have symptoms. Ask your health care provider if a screening pelvic exam is right for you.  If you have had past treatment for cervical cancer or a condition that could lead to cancer, you need Pap tests and screening for cancer for at least 20 years after your treatment. If Pap tests have been discontinued, your risk factors (such as having a new sexual partner) need to be reassessed to determine if screening should resume. Some women have medical problems that increase the chance of getting cervical cancer. In these cases, your health care provider may recommend more frequent screening and Pap tests. Colorectal Cancer  This type of cancer  can be detected and often prevented.  Routine colorectal cancer screening usually begins at 80 years of age and continues through 80 years of age.  Your health care provider may recommend screening at an earlier age if you have risk factors for colon cancer.  Your health care provider may also recommend using home test kits to check for hidden blood in the stool.  A small camera at the end of a tube can be used to examine your colon directly (sigmoidoscopy or colonoscopy). This is done to check for the earliest forms of colorectal cancer.  Routine screening usually begins at age 29.  Direct examination of the colon should be repeated every 5-10 years through 80 years of age. However, you may need to be screened more often if early forms of precancerous polyps or small growths are found. Skin Cancer  Check your skin from head to toe regularly.  Tell your health care provider about any new moles or changes in moles, especially if there is a change in a mole's shape or color.  Also tell your health care provider if you have a mole that is larger than the size of a pencil eraser.  Always use sunscreen.  Apply sunscreen liberally and repeatedly throughout the day.  Protect yourself by wearing long sleeves, pants, a wide-brimmed hat, and sunglasses whenever you are outside. Heart disease, diabetes, and high blood pressure  High blood pressure causes heart disease and increases the risk of stroke. High blood pressure is more likely to develop in: ? People who have blood pressure in the high end of the normal range (130-139/85-89 mm Hg). ? People who are overweight or obese. ? People who are African American.  If you are 59-88 years of age, have your blood pressure checked every 3-5 years. If you are 65 years of age or older, have your blood pressure checked every year. You should have your blood pressure measured twice-once when you are at a hospital or clinic, and once when you are not at a hospital or clinic. Record the average of the two measurements. To check your blood pressure when you are not at a hospital or clinic, you can use: ? An automated blood pressure machine at a pharmacy. ? A home blood pressure monitor.  If you are between 72 years and 34 years old, ask your health care provider if you should take aspirin to prevent strokes.  Have regular diabetes screenings. This involves taking a blood sample to check your fasting blood sugar level. ? If you are at a normal weight and have a low risk for diabetes, have this test once every three years after 80 years of age. ? If you are overweight and have a high risk for diabetes, consider being tested at a younger age or more often. Preventing infection Hepatitis B  If you have a higher risk for hepatitis B, you should be screened for this virus. You are considered at high risk for hepatitis B if: ? You were born in a country where hepatitis B is common. Ask your health care provider which countries are considered high risk. ? Your parents were born in a high-risk country, and you have not been immunized against hepatitis B (hepatitis B  vaccine). ? You have HIV or AIDS. ? You use needles to inject street drugs. ? You live with someone who has hepatitis B. ? You have had sex with someone who has hepatitis B. ? You get hemodialysis treatment. ? You take certain medicines  for conditions, including cancer, organ transplantation, and autoimmune conditions. Hepatitis C  Blood testing is recommended for: ? Everyone born from [birth year] through 1967. ? Anyone with known risk factors for hepatitis C. Sexually transmitted infections (STIs)  You should be screened for sexually transmitted infections (STIs) including gonorrhea and chlamydia if: ? You are sexually active and are younger than 80 years of age. ? You are older than 80 years of age and your health care provider tells you that you are at risk for this type of infection. ? Your sexual activity has changed since you were last screened and you are at an increased risk for chlamydia or gonorrhea. Ask your health care provider if you are at risk.  If you do not have HIV, but are at risk, it may be recommended that you take a prescription medicine daily to prevent HIV infection. This is called pre-exposure prophylaxis (PrEP). You are considered at risk if: ? You are sexually active and do not regularly use condoms or know the HIV status of your partner(s). ? You take drugs by injection. ? You are sexually active with a partner who has HIV. Talk with your health care provider about whether you are at high risk of being infected with HIV. If you choose to begin PrEP, you should first be tested for HIV. You should then be tested every 3 months for as long as you are taking PrEP. Pregnancy  If you are premenopausal and you may become pregnant, ask your health care provider about preconception counseling.  If you may become pregnant, take 400 to 800 micrograms (mcg) of folic acid every day.  If you want to prevent pregnancy, talk to your health care provider about birth control  (contraception). Osteoporosis and menopause  Osteoporosis is a disease in which the bones lose minerals and strength with aging. This can result in serious bone fractures. Your risk for osteoporosis can be identified using a bone density scan.  If you are 81 years of age or older, or if you are at risk for osteoporosis and fractures, ask your health care provider if you should be screened.  Ask your health care provider whether you should take a calcium or vitamin D supplement to lower your risk for osteoporosis.  Menopause may have certain physical symptoms and risks.  Hormone replacement therapy may reduce some of these symptoms and risks. Talk to your health care provider about whether hormone replacement therapy is right for you. Follow these instructions at home:  Schedule regular health, dental, and eye exams.  Stay current with your immunizations.  Do not use any tobacco products including cigarettes, chewing tobacco, or electronic cigarettes.  If you are pregnant, do not drink alcohol.  If you are breastfeeding, limit how much and how often you drink alcohol.  Limit alcohol intake to no more than 1 drink per day for nonpregnant women. One drink equals 12 ounces of beer, 5 ounces of wine, or 1 ounces of hard liquor.  Do not use street drugs.  Do not share needles.  Ask your health care provider for help if you need support or information about quitting drugs.  Tell your health care provider if you often feel depressed.  Tell your health care provider if you have ever been abused or do not feel safe at home. This information is not intended to replace advice given to you by your health care provider. Make sure you discuss any questions you have with your health care provider. Document Released:  09/15/2010 Document Revised: 08/08/2015 Document Reviewed: 12/04/2014 Elsevier Interactive Patient Education  [employer].

## 2018-05-16 NOTE — Telephone Encounter (Signed)
During AWV, pt. expressed concern over not having A1c monitored as closely as she has been used to (every 4 months per pt. Report), and overall concerned about her diabetes care. Pt. stated she does not see endocrinology at this time, and does not monitor BGs because she does not have a working meter or strips anymore. When author offered to make a phy/OV appointment with Dr. Volanda Napoleon, pt. declined, stating she will probably transfer care to an endocrinologist. Pt. stated "if I change my mind, I'll call you let you know". Pt. still wanted to stay for awv, however. Health maintenance and immunization recommendations reviewed, and resources provided. Pt. Continued to be very vague during the visit about what she wanted/needed from her PCP, and declined any communication with her or any other provider in the practice at this time.  Routed to PCP as FYI.

## 2018-05-19 NOTE — Telephone Encounter (Signed)
Author phoned pt. X2 to offer to make CPE with Dr. Volanda Napoleon. No answer, author left detailed VM asking for return call if interested in following up on diabetes care with her, and need for med refills.

## 2018-05-31 ENCOUNTER — Other Ambulatory Visit: Payer: Self-pay | Admitting: Obstetrics & Gynecology

## 2018-07-13 DIAGNOSIS — M170 Bilateral primary osteoarthritis of knee: Secondary | ICD-10-CM | POA: Diagnosis not present

## 2018-07-29 ENCOUNTER — Telehealth: Payer: Self-pay | Admitting: *Deleted

## 2018-07-29 NOTE — Telephone Encounter (Signed)
Copied from [place] [id number]. Topic: General - Other >> Jul 29, 2018 10:38 AM Carolyn Stare wrote:  Pt req refill on below med and req 90 day supply  which would be 180 pills   metFORMIN (GLUCOPHAGE-XR) 500 MG 24 hr tablet

## 2018-08-01 ENCOUNTER — Other Ambulatory Visit: Payer: Self-pay

## 2018-08-01 DIAGNOSIS — E119 Type 2 diabetes mellitus without complications: Secondary | ICD-10-CM

## 2018-08-01 MED ORDER — METFORMIN HCL ER 500 MG PO TB24: 500.0000 mg | ORAL_TABLET | Freq: Two times a day (BID) | ORAL | 1 refills | Status: DC

## 2018-08-01 NOTE — Telephone Encounter (Signed)
Rx sent to pt pharmacy as requested 

## 2018-08-03 ENCOUNTER — Telehealth: Payer: Self-pay | Admitting: *Deleted

## 2018-08-03 NOTE — Telephone Encounter (Signed)
Referral sent to Endocrinologist

## 2018-08-03 NOTE — Telephone Encounter (Signed)
Copied from [place] [id number]. Topic: Referral - Request for Referral >> Aug 03, 2018  1:10 PM Oneta Rack wrote: Relation to pt: self  Call back number: [phone] / [phone]  Reason for call:  Requesting a referral to Dr. Dwyane Dee endocrinologist to manage her diabetes, please advise

## 2018-08-08 ENCOUNTER — Encounter: Payer: Self-pay | Admitting: Gastroenterology

## 2018-08-15 DIAGNOSIS — C229 Malignant neoplasm of liver, not specified as primary or secondary: Secondary | ICD-10-CM

## 2018-08-15 HISTORY — DX: Malignant neoplasm of liver, not specified as primary or secondary: C22.9

## 2018-08-15 HISTORY — PX: LIVER BIOPSY: SHX301

## 2018-08-17 DIAGNOSIS — M170 Bilateral primary osteoarthritis of knee: Secondary | ICD-10-CM | POA: Diagnosis not present

## 2018-08-24 ENCOUNTER — Other Ambulatory Visit: Payer: Self-pay | Admitting: *Deleted

## 2018-08-24 DIAGNOSIS — Z20822 Contact with and (suspected) exposure to covid-19: Secondary | ICD-10-CM

## 2018-08-24 DIAGNOSIS — R6889 Other general symptoms and signs: Secondary | ICD-10-CM | POA: Diagnosis not present

## 2018-08-25 LAB — NOVEL CORONAVIRUS, NAA: SARS-CoV-2, NAA: NOT DETECTED

## 2018-08-26 ENCOUNTER — Telehealth: Payer: Self-pay | Admitting: Family Medicine

## 2018-08-26 NOTE — Telephone Encounter (Signed)
Pt. Called back and given negative COVID 19 results. Verbalizes understanding.

## 2018-08-29 ENCOUNTER — Inpatient Hospital Stay: Payer: Medicare Other | Attending: Adult Health | Admitting: Adult Health

## 2018-08-29 ENCOUNTER — Other Ambulatory Visit: Payer: Self-pay

## 2018-08-29 ENCOUNTER — Encounter: Payer: Self-pay | Admitting: Adult Health

## 2018-08-29 VITALS — BP 199/74 | HR 65 | Temp 98.0°F | Resp 18 | Ht 65.0 in | Wt 155.9 lb

## 2018-08-29 DIAGNOSIS — I890 Lymphedema, not elsewhere classified: Secondary | ICD-10-CM

## 2018-08-29 DIAGNOSIS — M79621 Pain in right upper arm: Secondary | ICD-10-CM | POA: Diagnosis not present

## 2018-08-29 DIAGNOSIS — C50911 Malignant neoplasm of unspecified site of right female breast: Secondary | ICD-10-CM

## 2018-08-29 DIAGNOSIS — I10 Essential (primary) hypertension: Secondary | ICD-10-CM

## 2018-08-29 DIAGNOSIS — Z86000 Personal history of in-situ neoplasm of breast: Secondary | ICD-10-CM | POA: Diagnosis not present

## 2018-08-29 DIAGNOSIS — R197 Diarrhea, unspecified: Secondary | ICD-10-CM | POA: Diagnosis not present

## 2018-08-29 DIAGNOSIS — Z853 Personal history of malignant neoplasm of breast: Secondary | ICD-10-CM | POA: Diagnosis not present

## 2018-08-29 DIAGNOSIS — M1990 Unspecified osteoarthritis, unspecified site: Secondary | ICD-10-CM | POA: Diagnosis not present

## 2018-08-29 DIAGNOSIS — Z9011 Acquired absence of right breast and nipple: Secondary | ICD-10-CM | POA: Diagnosis not present

## 2018-08-29 DIAGNOSIS — Z79899 Other long term (current) drug therapy: Secondary | ICD-10-CM | POA: Diagnosis not present

## 2018-08-29 DIAGNOSIS — R0789 Other chest pain: Secondary | ICD-10-CM | POA: Diagnosis not present

## 2018-08-29 DIAGNOSIS — L989 Disorder of the skin and subcutaneous tissue, unspecified: Secondary | ICD-10-CM | POA: Diagnosis not present

## 2018-08-29 DIAGNOSIS — R1110 Vomiting, unspecified: Secondary | ICD-10-CM | POA: Diagnosis not present

## 2018-08-29 DIAGNOSIS — Z87891 Personal history of nicotine dependence: Secondary | ICD-10-CM

## 2018-08-29 NOTE — Progress Notes (Signed)
CLINIC:  Survivorship   REASON FOR VISIT:  Routine follow-up for history of breast cancer.   BRIEF ONCOLOGIC HISTORY:   Per Dr. [name] Dad 12/27/2015 note  (1) Status post right mastectomy and axillary lymph node dissection March 1992 for what by the patient's recollection was a noninvasive breast cancer  (2) right upper extremity lymphedema developing August 2017 (a) right upper extremity Doppler ultrasonography 10/29/2015 shows no evidence of clot (b) right axillary ultrasonography at [facility] 10/31/2015 shows a single likely benign lymph node (c) chest CT and bone scan showed no evidence of recurrence or metastatic disease   INTERVAL HISTORY:  Donna May presents to the [facility] today for routine follow-up for her history of breast cancer.  Overall, she reports feeling moderately well.  She notes that yesterday she was experiencing vomiting multiple times and diarrhea.  This has since resolved.  She was unable to take her anti hypertensive medication.  She is starting to eat and drink now, and has had no further issues today.    Donna May is seeing her PCP regularly.  She is up to date with her mammogram, her last one in 11/2017, showed no evidence of malignancy and breast density category A.  Her last pap smear was in 2019 and was normal.  She is up to date with her cancer screenings.  She wants to go see dermatology about a spot on her back.  Darlyn is not exercising.  She notes she has significant arthritis.  She says she has been reading on the Internet and discovered that tomatoes may be making it worse.    She notes an intermittent pain in her right axilla, along with a new pain in her left lower posterior rib cage. The rib cage pain started about 2 weeks ago, with no precipitating factor, and is intermittent.     REVIEW OF SYSTEMS:  Review of Systems  Constitutional: Negative for appetite change, chills and fatigue.  HENT:   Negative  for hearing loss, lump/mass, mouth sores and trouble swallowing.   Eyes: Negative for eye problems and icterus.  Respiratory: Negative for chest tightness, cough and shortness of breath.   Cardiovascular: Negative for chest pain, leg swelling and palpitations.  Gastrointestinal: Negative for abdominal distention, abdominal pain, constipation, diarrhea, nausea and vomiting.  Endocrine: Negative for hot flashes.  Musculoskeletal: Negative for arthralgias.  Skin: Negative for itching and rash.  Neurological: Negative for dizziness, extremity weakness, headaches and numbness.  Hematological: Negative for adenopathy. Does not bruise/bleed easily.  Psychiatric/Behavioral: Negative for depression. The patient is not nervous/anxious.   Breast: Denies any new nodularity, masses, tenderness, nipple changes, or nipple discharge.       PAST MEDICAL/SURGICAL HISTORY:  Past Medical History:  Diagnosis Date  . Anemia   . Arthritis   . Breast cancer ([place]) 1992  . Diabetes mellitus    type 2  . Duodenitis 01/18/2002  . Fainting spell   . GERD (gastroesophageal reflux disease)   . Heart murmur   . Hiatal hernia 08/08/2008, 01/18/2002  . History of pneumonia   . Hyperlipidemia   . Hypertension   . Lymphedema 2017   Right arm  . UTI (lower urinary tract infection)    Past Surgical History:  Procedure Laterality Date  . AXILLARY SURGERY     cyst removal, right  . MASTECTOMY  1992   right, with flap  . NM MYOCAR PERF WALL MOTION  06/11/2009   Protocol:[place], post stress EF58%, EKG negative for ischemia, low risk  .  TONSILLECTOMY    . TRANSTHORACIC ECHOCARDIOGRAM  12/24/2009   LVEF =>55%, normal study     ALLERGIES:  No Known Allergies   CURRENT MEDICATIONS:  Outpatient Encounter Medications as of 08/29/2018  Medication Sig Note  . amLODipine (NORVASC) 10 MG tablet TAKE 1 TABLET BY MOUTH EVERY DAY   . aspirin 81 MG tablet Take 81 mg by mouth daily as needed.    Marland Kitchen  CALCIUM-MAGNESIUM-VITAMIN D PO Take 1 tablet by mouth daily.   . Cholecalciferol (VITAMIN D3) 2000 UNITS TABS Take 1 tablet by mouth daily as needed.  04/10/2016: Sometimes takes 5000 per MD   . Echinacea 400 MG CAPS Take 1 capsule by mouth daily.   Marland Kitchen glucose blood (ACCU-CHEK AVIVA) test strip Use as instructed   . ketoconazole (NIZORAL) 2 % cream Apply 1 fingertip amount to each foot daily. (Patient taking differently: as needed. Apply 1 fingertip amount to each foot daily.)   . lansoprazole (PREVACID) 15 MG capsule Take 1 capsule (15 mg total) by mouth daily before breakfast.   . metFORMIN (GLUCOPHAGE-XR) 500 MG 24 hr tablet Take 1 tablet (500 mg total) by mouth 2 (two) times daily before a meal.   . metoprolol succinate (TOPROL-XL) 100 MG 24 hr tablet TAKE 1 TABLET (100 MG TOTAL) BY MOUTH DAILY. TAKE WITH OR IMMEDIATELY FOLLOWING A MEAL.   . Multiple Vitamins-Minerals (CENTRUM SILVER ULTRA WOMENS) TABS Take 1 tablet by mouth daily as needed.    . Omega 3 1000 MG CAPS Take 1 capsule by mouth daily.   . promethazine-dextromethorphan (PROMETHAZINE-DM) 6.25-15 MG/5ML syrup Take 5 mLs by mouth 3 (three) times daily as needed for cough.   . raloxifene (EVISTA) 60 MG tablet TAKE 1 TABLET DAILY   . rosuvastatin (CRESTOR) 20 MG tablet Take 1 tablet (20 mg total) by mouth daily.   . sitaGLIPtin (JANUVIA) 100 MG tablet Take 1 tablet (100 mg total) by mouth daily.   Marland Kitchen triamcinolone cream (KENALOG) 0.1 % Apply 1 application topically 2 (two) times daily.   . valsartan (DIOVAN) 320 MG tablet Take 1 tablet (320 mg total) by mouth daily.   . hydrochlorothiazide (HYDRODIURIL) 25 MG tablet Take 1 tablet (25 mg total) by mouth daily.   . [DISCONTINUED] albuterol (PROVENTIL HFA;VENTOLIN HFA) 108 (90 Base) MCG/ACT inhaler Inhale 1-2 puffs into the lungs every 6 (six) hours as needed for wheezing or shortness of breath. (Patient not taking: Reported on 08/29/2018)   . [DISCONTINUED] AMBULATORY NON FORMULARY MEDICATION  Place 0.0125 g rectally 2 (two) times daily. Medication Name: Nitroglycerin ointment .1696. Apply 2-3 times daily as needed. (Patient not taking: Reported on 05/16/2018)    Facility-Administered Encounter Medications as of 08/29/2018  Medication  . triamcinolone acetonide (KENALOG) 10 MG/ML injection 10 mg     ONCOLOGIC FAMILY HISTORY:  Family History  Problem Relation Age of Onset  . Breast cancer Cousin   . Diabetes Brother   . Heart disease Mother   . Hypertension Mother   . Heart disease Father   . Hypertension Father   . Prostate cancer Brother   . Heart attack Maternal Grandmother   . Stroke Maternal Grandfather     GENETIC COUNSELING/TESTING: Not at this time  SOCIAL HISTORY:  Social History   Socioeconomic History  . Marital status: Single    Spouse name: Not on file  . Number of children: 2  . Years of education: Not on file  . Highest education level: Not on file  Occupational History  .  Occupation: retired  Scientific laboratory technician  . Financial resource strain: Not hard at all  . Food insecurity    Worry: Never true    Inability: Never true  . Transportation needs    Medical: No    Non-medical: No  Tobacco Use  . Smoking status: Former Smoker    Packs/day: 0.01    Years: 10.00    Pack years: 0.10  . Smokeless tobacco: Never Used  . Tobacco comment: quit 78 yo;   Substance and Sexual Activity  . Alcohol use: Yes    Alcohol/week: 1.0 standard drinks    Types: 1 Glasses of wine per week    Comment: occ  . Drug use: No  . Sexual activity: Yes    Partners: Male    Birth control/protection: Condom    Comment: First sexual encounter age 63. Fewer than 5 partners in life time.  Lifestyle  . Physical activity    Days per week: 0 days    Minutes per session: 0 min  . Stress: Not at all  Relationships  . Social Herbalist on phone: Never    Gets together: Not on file    Attends religious service: More than 4 times per year    Active member of club or  organization: No    Attends meetings of clubs or organizations: Never    Relationship status: Not on file  . Intimate partner violence    Fear of current or ex partner: Not on file    Emotionally abused: Not on file    Physically abused: Not on file    Forced sexual activity: Not on file  Other Topics Concern  . 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      PHYSICAL EXAMINATION:  Vital Signs: Vitals:   08/29/18 1106  BP: (!) 199/74  Pulse: 65  Resp: 18  Temp: 98 F (36.7 C)  SpO2: 100%   Filed Weights   08/29/18 1106  Weight: 155 lb 14.4 oz (70.7 kg)   General: Well-nourished, well-appearing female in no acute distress.  Unaccompanied today.   HEENT: Head is normocephalic.  Pupils equal and reactive to light. Conjunctivae clear without exudate.  Sclerae anicteric. Oral mucosa is pink, moist.  Oropharynx is pink without lesions or erythema.  Lymph: No cervical, supraclavicular, or infraclavicular lymphadenopathy noted on palpation.  Cardiovascular: Regular rate and rhythm.Marland Kitchen Respiratory: Clear to auscultation bilaterally. Chest expansion symmetric; breathing non-labored.  Breast Exam:  -Left breast: No appreciable masses on palpation. No skin redness, thickening, or peau d'orange appearance; no nipple retraction or nipple discharge;.  -Right breast:s/p mastectomy and reconstruction, no sign of recurrence noted GI: Abdomen soft and round; non-tender, non-distended. Bowel sounds normoactive. No hepatosplenomegaly.   GU: Deferred.  Neuro: No focal deficits. Steady gait.  Psych: Mood and affect normal and appropriate for situation.  MSK: No focal spinal tenderness to palpation, full range of motion in bilateral upper extremities Extremities: No edema.  Skin: Warm and dry. Large sebaceous cyst noted on her back.  LABORATORY DATA:  None for this visit   DIAGNOSTIC IMAGING:  Most recent mammogram:      ASSESSMENT AND PLAN:  Donna May is a pleasant 78 y.o. female  with history of breast cancer from 1992, with new lymphedema that presented in 2017.  [facility] for [facility] and routine follow-up.   1. History of breast cancer:  Donna May is currently clinically and radiographically without evidence of disease or recurrence of breast cancer. She will be due for mammogram in 11/2018.  I will see her back in one year for follow up.  I encouraged her to call me with any questions or concerns before her next visit at the cancer center, and I would be happy to see her sooner, if needed.    2. Right Axillary discomfort/left posterior chest wall discomfort: Will get CT chest to fully evaluate.  She notes she has undergone multiple ultrasounds that have been negative, and she is very concerned that something may be there.    3. Hypertension:  I recommended she take her meds today and check her BP a couple hours later.  If it is still elevated, or if she develops symptoms she was recommended to contact her PCP promptly.  4. Lymphedema: Persistent.  Recommended she continue wearing her sleeve.  5. Bone health:  Given Donna May age, history of breast cancer, she is at risk for bone demineralization. Donna May follows her bone density testing.   She was given education on specific food and activities to promote bone health.  6. Cancer screening:  Due to Donna May history and her age, she should receive screening for skin cancers, and gynecologic cancers. She was encouraged to follow-up with her PCP for appropriate cancer screenings.   7. Health maintenance and wellness promotion: Donna May was encouraged to consume 5-7 servings of fruits and vegetables per day. She was also encouraged to engage in moderate to vigorous  exercise for 30 minutes per day most days of the week. She was instructed to limit her alcohol consumption and continue to abstain from tobacco use.   Dispo:  -Return to cancer center in one year for LTS follow up -CT chest -Mammogram in 11/2018   A total of (30) minutes of face-to-face time was spent with this patient with greater than 50% of that time in counseling and care-coordination.   Gardenia Phlegm, NP Survivorship Program [facility] [phone]   Note: PRIMARY CARE PROVIDER [name], [place] [phone]

## 2018-08-30 ENCOUNTER — Telehealth: Payer: Self-pay | Admitting: Adult Health

## 2018-08-30 NOTE — Telephone Encounter (Signed)
I talk with patient regarding schedule  

## 2018-09-01 ENCOUNTER — Encounter: Payer: Self-pay | Admitting: Endocrinology

## 2018-09-01 ENCOUNTER — Encounter: Payer: Self-pay | Admitting: Family Medicine

## 2018-09-01 ENCOUNTER — Other Ambulatory Visit: Payer: Self-pay

## 2018-09-01 ENCOUNTER — Ambulatory Visit (INDEPENDENT_AMBULATORY_CARE_PROVIDER_SITE_OTHER): Payer: Medicare Other | Admitting: Endocrinology

## 2018-09-01 ENCOUNTER — Ambulatory Visit (INDEPENDENT_AMBULATORY_CARE_PROVIDER_SITE_OTHER): Payer: Medicare Other | Admitting: Family Medicine

## 2018-09-01 VITALS — BP 136/68 | HR 57 | Ht 65.0 in | Wt 157.0 lb

## 2018-09-01 DIAGNOSIS — E7800 Pure hypercholesterolemia, unspecified: Secondary | ICD-10-CM

## 2018-09-01 DIAGNOSIS — K449 Diaphragmatic hernia without obstruction or gangrene: Secondary | ICD-10-CM

## 2018-09-01 DIAGNOSIS — I10 Essential (primary) hypertension: Secondary | ICD-10-CM

## 2018-09-01 DIAGNOSIS — E1169 Type 2 diabetes mellitus with other specified complication: Secondary | ICD-10-CM

## 2018-09-01 DIAGNOSIS — K219 Gastro-esophageal reflux disease without esophagitis: Secondary | ICD-10-CM | POA: Diagnosis not present

## 2018-09-01 DIAGNOSIS — E1140 Type 2 diabetes mellitus with diabetic neuropathy, unspecified: Secondary | ICD-10-CM | POA: Diagnosis not present

## 2018-09-01 LAB — POCT GLYCOSYLATED HEMOGLOBIN (HGB A1C): Hemoglobin A1C: 6.2 % — AB (ref 4.0–5.6)

## 2018-09-01 LAB — COMPREHENSIVE METABOLIC PANEL
ALT: 11 U/L (ref 0–35)
AST: 14 U/L (ref 0–37)
Albumin: 4 g/dL (ref 3.5–5.2)
Alkaline Phosphatase: 51 U/L (ref 39–117)
BUN: 21 mg/dL (ref 6–23)
CO2: 31 mEq/L (ref 19–32)
Calcium: 9.4 mg/dL (ref 8.4–10.5)
Chloride: 103 mEq/L (ref 96–112)
Creatinine, Ser: 1.4 mg/dL — ABNORMAL HIGH (ref 0.40–1.20)
GFR: 43.83 mL/min — ABNORMAL LOW (ref >60.00)
Glucose, Bld: 83 mg/dL (ref 70–99)
Potassium: 3.3 mEq/L — ABNORMAL LOW (ref 3.5–5.1)
Sodium: 141 mEq/L (ref 135–145)
Total Bilirubin: 0.3 mg/dL (ref 0.2–1.2)
Total Protein: 6.6 g/dL (ref 6.0–8.3)

## 2018-09-01 LAB — LIPID PANEL
Cholesterol: 190 mg/dL (ref 0–200)
HDL: 60.5 mg/dL (ref >39.00)
LDL Cholesterol: 120 mg/dL — ABNORMAL HIGH (ref 0–99)
NonHDL: 129.62
Total CHOL/HDL Ratio: 3
Triglycerides: 46 mg/dL (ref 0.0–149.0)
VLDL: 9.2 mg/dL (ref 0.0–40.0)

## 2018-09-01 LAB — MICROALBUMIN / CREATININE URINE RATIO
Creatinine,U: 141.9 mg/dL
Microalb Creat Ratio: 0.7 mg/g (ref 0.0–30.0)
Microalb, Ur: 0.9 mg/dL (ref 0.0–1.9)

## 2018-09-01 LAB — GLUCOSE, POCT (MANUAL RESULT ENTRY): POC Glucose: 94 mg/dl (ref 70–99)

## 2018-09-01 NOTE — Progress Notes (Signed)
Virtual Visit via Telephone Note  I connected with Donna May on 09/01/18 at 11:30 AM EDT by telephone and verified that I am speaking with the correct person using two identifiers.   I discussed the limitations, risks, security and privacy concerns of performing an evaluation and management service by telephone and the availability of in person appointments. I also discussed with the patient that there may be a patient responsible charge related to this service. The patient expressed understanding and agreed to proceed.  Location patient: home Location provider: work or home office Participants present for the call: patient, provider Patient did not have a visit in the prior 7 days to address this/these issue(s).   History of Present Illness: Pt is a 78 yo female with pmh sig for DM II, HTN, h/o breast cancer, HLD, GERD, hiatal hernia, renal artery stenosis.  Pt is followed by Cardiology, podiatry, and Endocrinology.  Pt states her bp was 199/70 something during an oncology OFV.  Pt's head was "feeling funny" at that time.  Pt states the day her bp was elevated she had not taken her bp meds yet, she had just walked up a hill to get to the cancer center, and she was under increased stress.  Pt takes bp meds in the am after eating ~11 am.  Pt using a wrist bp cuff at home.  BP has been 151/58, 131/76, 136/63, 169/79, 127/73.  Pt feeling better today.  Endorses not adding extra salt to her food.  Pt denies HA, dizziness, CP, changes in vision.  Pt asks about Crohn's dz.  Has an upcoming colonoscopy.  Endorses increased flatus, diarrhea.  Happens occasionally.  Using lactose-free milk.  Taking lansoprazole 15 mg for GERD/h/o hiatal hernia.  States in the past was on a higher dose.  Pt no longer has a glucometer.  States she ran out of strips.  Pt has an appt with Dr. Dwyane Dee, Endocrinology today.  Pt was seeing Dr. Elyse Hsu.  Pt's son is the chief of police in [place].    Observations/Objective: Patient sounds cheerful and well on the phone. I do not appreciate any SOB. Speech and thought processing are grossly intact. Patient reported vitals:  Assessment and Plan: Essential hypertension  -hesitant to adjust meds at this time given liable bp -continue current meds:  HCTZ 25 mg daily, norvasc 10 mg, Toprol XL 100 mg daily, and valsartan 320 mg daily. -hydration and other lifestyle modifications encouraged. -pt advised to continue checking bp at home. -pt to f/u in 1-2 wks in clinic for bp.  Pt to bring home cuff.  Pt to notify clinic if bp consistently >150/90 -pt also encouraged to f/u with Cardiology soon - Plan: Basic metabolic panel  Type 2 diabetes mellitus with other specified complication, without long-term current use of insulin ([place])  -continue currently medications -discussed lifestyle modifications -encouraged to keep f/u with Endocrinology today.  Will defer to Endo for testing supplies and meter. -consider nutrition education -eye exam, hgb A1c, and foot exam due  Hiatal hernia with GERD -continue lansoprazole 15 mg daily -pt encouraged to keep a food diary -pt encouraged to keep upcoming GI appointment.  May benefit from EGD at time of colonoscopy.   Follow Up Instructions: Pt to f/u in the next 1-2 wks for bp in office.  I did not refer this patient for an OV in the next 24 hours for this/these issue(s).  I discussed the assessment and treatment plan with the patient. The patient was provided  an opportunity to ask questions and all were answered. The patient agreed with the plan and demonstrated an understanding of the instructions.   The patient was advised to call back or seek an in-person evaluation if the symptoms worsen or if the condition fails to improve as anticipated.  I provided 21 minutes of non-face-to-face time during this encounter.   [name], MD

## 2018-09-01 NOTE — Patient Instructions (Addendum)
Check blood sugars on waking up 2-3 days a week  Also check blood sugars about 2 hours after meals and do this after different meals by rotation  Recommended blood sugar levels on waking up are 90-130 and about 2 hours after meal is 130-160  Please bring your blood sugar monitor to each visit, thank you  CALL BEFORE refilling JANUVIA

## 2018-09-01 NOTE — Progress Notes (Signed)
Patient ID: Donna May, female   DOB: [birth year], 78 y.o.   MRN: [id number]           Reason for Appointment: Consultation for Type 2 Diabetes  Referring PCP: Grier Mitts   History of Present Illness:          Date of diagnosis of type 2 diabetes mellitus:  2004      Background history:   She is not sure how her diabetes was diagnosed or when.  She has been mostly on metformin and Januvia Usually has had good control with A1c reportedly under 7% most of the time In the last couple of years has not had any regular follow-up  Recent history:   Most recent A1c is 6.2 done on 09/01/2018   Non-insulin hypoglycemic drugs the patient is taking are: Metformin ER 500 mg daily, Januvia 100 mg daily  Current management, blood sugar patterns and problems identified:  She has been on the same diabetes medications for several years without any change  She has not had a glucose meter in quite some time and did not ask for another 1  Although she has seen a dietitian several years ago she is interested in more information for meal planning  More recently has lost a little weight without trying  Has been no side effects with metformin which she takes regularly but is asking about any significant side effects    She wants to be seen by a specialist since she was previously under the care of an endocrinologist       Side effects from medications have been: None   Typical meal intake: Breakfast is cereal or egg/bacon or oatmeal               Exercise:  Unable to do much  Glucose monitoring:  not done         Glucometer:  Previously Accu-Chek?     Blood Glucose readings not available  Dietician visit, most recent: Several years ago  Weight history:  Wt Readings from Last 3 Encounters:  09/01/18 157 lb (71.2 kg)  08/29/18 155 lb 14.4 oz (70.7 kg)  05/16/18 168 lb (76.2 kg)    Glycemic control:   Lab Results  Component Value Date   HGBA1C 6.2 (A) 09/01/2018   HGBA1C 6.5  (A) 09/06/2017   HGBA1C 6.5 04/26/2017   Lab Results  Component Value Date   MICROALBUR 1.3 04/26/2017   LDLCALC 133 (H) 10/07/2017   CREATININE 1.21 (H) 10/07/2017   Lab Results  Component Value Date   MICRALBCREAT 1.1 04/26/2017    No results found for: FRUCTOSAMINE  Office Visit on 09/01/2018  Component Date Value Ref Range Status  . Hemoglobin A1C 09/01/2018 6.2* 4.0 - 5.6 % Final  . POC Glucose 09/01/2018 94  70 - 99 mg/dl Final    Allergies as of 09/01/2018   No Known Allergies     Medication List       Accurate as of September 01, 2018  2:46 PM. If you have any questions, ask your nurse or doctor.        amLODipine 10 MG tablet Commonly known as: NORVASC TAKE 1 TABLET BY MOUTH EVERY DAY   aspirin 81 MG tablet Take 81 mg by mouth daily as needed.   CALCIUM-MAGNESIUM-VITAMIN D PO Take 1 tablet by mouth daily.   Centrum Silver Ultra Womens Tabs Take 1 tablet by mouth daily as needed.   Echinacea 400 MG Caps Take 1 capsule  by mouth daily.   glucose blood test strip Commonly known as: Accu-Chek Aviva Use as instructed   hydrochlorothiazide 25 MG tablet Commonly known as: HYDRODIURIL Take 1 tablet (25 mg total) by mouth daily.   ketoconazole 2 % cream Commonly known as: NIZORAL Apply 1 fingertip amount to each foot daily. What changed:   when to take this  reasons to take this   lansoprazole 15 MG capsule Commonly known as: PREVACID Take 1 capsule (15 mg total) by mouth daily before breakfast.   metFORMIN 500 MG 24 hr tablet Commonly known as: GLUCOPHAGE-XR Take 1 tablet (500 mg total) by mouth 2 (two) times daily before a meal.   metoprolol succinate 100 MG 24 hr tablet Commonly known as: TOPROL-XL TAKE 1 TABLET (100 MG TOTAL) BY MOUTH DAILY. TAKE WITH OR IMMEDIATELY FOLLOWING A MEAL.   Omega 3 1000 MG Caps Take 1 capsule by mouth daily.   promethazine-dextromethorphan 6.25-15 MG/5ML syrup Commonly known as: PROMETHAZINE-DM Take 5 mLs by  mouth 3 (three) times daily as needed for cough.   raloxifene 60 MG tablet Commonly known as: EVISTA TAKE 1 TABLET DAILY   rosuvastatin 20 MG tablet Commonly known as: CRESTOR Take 1 tablet (20 mg total) by mouth daily.   sitaGLIPtin 100 MG tablet Commonly known as: Januvia Take 1 tablet (100 mg total) by mouth daily.   triamcinolone cream 0.1 % Commonly known as: KENALOG Apply 1 application topically 2 (two) times daily.   valsartan 320 MG tablet Commonly known as: DIOVAN Take 1 tablet (320 mg total) by mouth daily.   Vitamin D3 50 MCG (2000 UT) Tabs Take 1 tablet by mouth daily as needed.       Allergies: No Known Allergies  Past Medical History:  Diagnosis Date  . Anemia   . Arthritis   . Breast cancer ([place]) 1992  . Diabetes mellitus    type 2  . Duodenitis 01/18/2002  . Fainting spell   . GERD (gastroesophageal reflux disease)   . Heart murmur   . Hiatal hernia 08/08/2008, 01/18/2002  . History of pneumonia   . Hyperlipidemia   . Hypertension   . Lymphedema 2017   Right arm  . UTI (lower urinary tract infection)     Past Surgical History:  Procedure Laterality Date  . AXILLARY SURGERY     cyst removal, right  . MASTECTOMY  1992   right, with flap  . NM MYOCAR PERF WALL MOTION  06/11/2009   Protocol:[place], post stress EF58%, EKG negative for ischemia, low risk  . TONSILLECTOMY    . TRANSTHORACIC ECHOCARDIOGRAM  12/24/2009   LVEF =>55%, normal study    Family History  Problem Relation Age of Onset  . Breast cancer Cousin   . Diabetes Brother   . Heart disease Mother   . Hypertension Mother   . Heart disease Father   . Hypertension Father   . Prostate cancer Brother   . Heart attack Maternal Grandmother   . Stroke Maternal Grandfather     Social History:  reports that she has quit smoking. She has a 0.10 pack-year smoking history. She has never used smokeless tobacco. She reports current alcohol use of about 1.0 standard drinks of alcohol per  week. She reports that she does not use drugs.   Review of Systems  Constitutional: Positive for weight loss. Negative for reduced appetite.  HENT: Negative for headaches.   Eyes: Negative for blurred vision.  Respiratory: Negative for shortness of breath.  Cardiovascular: Negative for leg swelling and claudication.  Gastrointestinal: Negative for diarrhea.  Endocrine: Negative for fatigue.  Genitourinary: Negative for nocturia.  Musculoskeletal: Positive for joint pain.       She has multiple joint including knee joint pains  Skin: Negative for rash.  Neurological: Positive for tingling.       She has some pins-and-needles and hot feelings in her lower legs at times but not interfering with sleep.  No numbness  Psychiatric/Behavioral: Positive for insomnia.       May have some late insomnia, no history of depression   Oncology: She is followed by oncologist for breast cancer.  Also has had persistent lymphedema of the right arm after surgery  Lipid history: Prescribed Crestor by PCP but has not had a follow-up    Lab Results  Component Value Date   CHOL 209 (H) 10/07/2017   HDL 63 10/07/2017   LDLCALC 133 (H) 10/07/2017   TRIG 64 10/07/2017   CHOLHDL 3.3 10/07/2017           Hypertension: Has been present for several years managed by PCP  BP Readings from Last 3 Encounters:  09/01/18 136/68  08/29/18 (!) 199/74  04/25/18 (!) 178/70    Most recent eye exam was in 2019  Most recent foot exam: 08/2018  Currently known complications of diabetes: Neuropathy   Lab Results  Component Value Date   TSH 1.48 04/26/2017     LABS:  Office Visit on 09/01/2018  Component Date Value Ref Range Status  . Hemoglobin A1C 09/01/2018 6.2* 4.0 - 5.6 % Final  . POC Glucose 09/01/2018 94  70 - 99 mg/dl Final    Physical Examination:  BP 136/68 (BP Location: Left Arm, Patient Position: Sitting, Cuff Size: Normal)   Pulse (!) 57   Ht 5\' 5"  (1.651 m)   Wt 157 lb (71.2 kg)    SpO2 97%   BMI 26.13 kg/m   GENERAL:         Patient has mild obesity.    HEENT:         Eye exam shows normal external appearance.  Fundus exam deferred to ophthalmologist    NECK:   There is no lymphadenopathy  Thyroid is not enlarged and no nodules felt.    LUNGS:         Chest is symmetrical. Lungs are clear to auscultation.Marland Kitchen   HEART:         Heart sounds:  S1 and S2 are normal. No murmur or click heard., no S3 or S4.   ABDOMEN:   There is no distention present.  Exam not done    NEUROLOGICAL:   Ankle jerks are absent bilaterally.    Diabetic Foot Exam - Simple   Simple Foot Form Diabetic Foot exam was performed with the following findings: Yes   Visual Inspection No deformities, no ulcerations, no other skin breakdown bilaterally: Yes Sensation Testing Intact to touch and monofilament testing bilaterally: Yes Pulse Check See comments: Yes Comments Absent pedal pulses            Vibration sense is markedly reduced in distal first toes.  MUSCULOSKELETAL:  There is no swelling or deformity of the peripheral joints.     EXTREMITIES:     There is no ankle edema.  SKIN:       No rash or lesions of concern.        ASSESSMENT:  Diabetes type 2  See history of present illness for detailed  discussion of current diabetes management, blood sugar patterns and problems identified  Recent A1c indicates excellent control  Current treatment regimen is Januvia and metformin ER She has had this regimen for the last several years and does not appear to have had any progression of her diabetes This is despite her not monitoring her blood sugars for having a structured diet or exercise regimen More recently has been losing weight for unclear reasons and this may be helping her insulin resistant Discussed importance of keeping her weight down to help insulin resistance  Complications of diabetes: Mild symptomatic peripheral neuropathy, no sensory loss but has loss of vibration  sense  Needs to have urine microalbumin checked and also follow-up eye exam in the near future  Longstanding hypertension, well controlled with multiple drugs  HYPERCHOLESTEROLEMIA: Started on rosuvastatin but has not had a follow-up to determine whether this is under control  Probable mild peripheral vascular disease with absent pulses but no symptoms of claudication  PLAN:    1. Glucose monitoring: She does not have a glucose meter Given her One Touch Verio meter and showed her how to use this . Patient advised to check readings either fasting or 2 hours after meals with average testing pregnancy once a day  2.  Diabetes education: . Patient will benefit from consultation with dietitian and she agrees to consider this  3.  Lifestyle changes: . Dietary changes: If her sugars are higher after certain meals like eating cereal she will need to reduce carbohydrate intake . Exercise regimen: Encouraged her to be as active with walking or other activities as much as possible  4.  Medication changes needed: . To reduce cardiovascular risk she would benefit from an SGLT2 drug rather than the Januvia Discussed action of SGLT 2 drugs on lowering glucose by decreasing kidney absorption of glucose, benefits of weight loss and lower blood pressure, possible side effects including candidiasis and dosage regimen  . When she finishes Januvia she will be switched to [place] which appears to be preferred on her insurance and can start with 5 mg daily.  Patient information brochure was given . She may be able to combine this with metformin and use Xigduo . Consider increasing metformin dose if needed   5.  Preventive care needed:  . Annual eye exams.  We will try to get the reports of her previous eye exam from ophthalmologist  6.  Follow-up: 3 months  Labs to be checked for lipids, microalbumin and chemistry panel  NEUROPATHY: Currently not symptomatic enough to need treatment  Total visit  time for evaluation and management of multiple problems and counseling = 45 minutes  Patient Instructions  Check blood sugars on waking up 2-3 days a week  Also check blood sugars about 2 hours after meals and do this after different meals by rotation  Recommended blood sugar levels on waking up are 90-130 and about 2 hours after meal is 130-160  Please bring your blood sugar monitor to each visit, thank you  [address]    Consultation note has been sent to the referring physician  Elayne Snare 09/01/2018, 2:46 PM   Note: This office note was prepared with Dragon voice recognition system technology. Any transcriptional errors that result from this process are unintentional.

## 2018-09-02 ENCOUNTER — Other Ambulatory Visit: Payer: Self-pay | Admitting: Family Medicine

## 2018-09-02 DIAGNOSIS — E876 Hypokalemia: Secondary | ICD-10-CM

## 2018-09-02 MED ORDER — POTASSIUM CHLORIDE CRYS ER 20 MEQ PO TBCR: 20.0000 meq | EXTENDED_RELEASE_TABLET | Freq: Every day | ORAL | 0 refills | Status: DC

## 2018-09-07 ENCOUNTER — Encounter (HOSPITAL_COMMUNITY): Payer: Self-pay

## 2018-09-07 ENCOUNTER — Ambulatory Visit (HOSPITAL_COMMUNITY)
Admission: RE | Admit: 2018-09-07 | Discharge: 2018-09-07 | Disposition: A | Discharge status: Home / Self Care | Payer: Medicare Other | Source: Ambulatory Visit | Attending: Adult Health | Admitting: Adult Health

## 2018-09-07 ENCOUNTER — Other Ambulatory Visit: Payer: Self-pay

## 2018-09-07 DIAGNOSIS — Z171 Estrogen receptor negative status [ER-]: Secondary | ICD-10-CM | POA: Insufficient documentation

## 2018-09-07 DIAGNOSIS — C50911 Malignant neoplasm of unspecified site of right female breast: Secondary | ICD-10-CM | POA: Insufficient documentation

## 2018-09-07 DIAGNOSIS — I700 Atherosclerosis of aorta: Secondary | ICD-10-CM | POA: Diagnosis not present

## 2018-09-07 MED ORDER — SODIUM CHLORIDE (PF) 0.9 % IJ SOLN
INTRAMUSCULAR | Status: AC
  Filled 2018-09-07: qty 50

## 2018-09-07 MED ORDER — IOHEXOL 300 MG/ML  SOLN
75.0000 mL | Freq: Once | INTRAMUSCULAR | Status: AC | PRN
  Administered 2018-09-07: 75 mL via INTRAVENOUS

## 2018-09-08 ENCOUNTER — Other Ambulatory Visit: Payer: Self-pay | Admitting: Adult Health

## 2018-09-08 DIAGNOSIS — R160 Hepatomegaly, not elsewhere classified: Secondary | ICD-10-CM

## 2018-09-08 NOTE — Progress Notes (Signed)
Called patient and reviewed her scan results with her and that there is a new mass on her liver that we need to evaluate.  I reviewed with her that this was not visible when she was initially evaluated for her right arm swelling, and a CT chest was done at that time in 2017.  I let Sheralee know that I reviewed the scans with Dr. Jana Hakim who recommended we get an ultrasound guided biopsy of this area so that we can determine if this is the breast cancer that she has a history of, or if this is something else.  Patient verbalized understanding and awaits a call from radiology.    Wilber Bihari, NP

## 2018-09-12 ENCOUNTER — Other Ambulatory Visit: Payer: Self-pay | Admitting: Radiology

## 2018-09-13 ENCOUNTER — Other Ambulatory Visit: Payer: Self-pay

## 2018-09-13 ENCOUNTER — Other Ambulatory Visit: Payer: Self-pay | Admitting: Adult Health

## 2018-09-13 ENCOUNTER — Ambulatory Visit (HOSPITAL_COMMUNITY)
Admission: RE | Admit: 2018-09-13 | Discharge: 2018-09-13 | Disposition: A | Discharge status: Home / Self Care | Payer: Medicare Other | Source: Ambulatory Visit | Attending: Adult Health | Admitting: Adult Health

## 2018-09-13 ENCOUNTER — Telehealth: Payer: Self-pay

## 2018-09-13 ENCOUNTER — Encounter (HOSPITAL_COMMUNITY): Payer: Self-pay

## 2018-09-13 ENCOUNTER — Telehealth: Payer: Self-pay | Admitting: Oncology

## 2018-09-13 DIAGNOSIS — K828 Other specified diseases of gallbladder: Secondary | ICD-10-CM | POA: Diagnosis not present

## 2018-09-13 DIAGNOSIS — K449 Diaphragmatic hernia without obstruction or gangrene: Secondary | ICD-10-CM | POA: Insufficient documentation

## 2018-09-13 DIAGNOSIS — D649 Anemia, unspecified: Secondary | ICD-10-CM | POA: Insufficient documentation

## 2018-09-13 DIAGNOSIS — C229 Malignant neoplasm of liver, not specified as primary or secondary: Secondary | ICD-10-CM | POA: Diagnosis not present

## 2018-09-13 DIAGNOSIS — M1990 Unspecified osteoarthritis, unspecified site: Secondary | ICD-10-CM | POA: Insufficient documentation

## 2018-09-13 DIAGNOSIS — Z87891 Personal history of nicotine dependence: Secondary | ICD-10-CM | POA: Diagnosis not present

## 2018-09-13 DIAGNOSIS — K7689 Other specified diseases of liver: Secondary | ICD-10-CM | POA: Diagnosis not present

## 2018-09-13 DIAGNOSIS — K219 Gastro-esophageal reflux disease without esophagitis: Secondary | ICD-10-CM | POA: Diagnosis not present

## 2018-09-13 DIAGNOSIS — R109 Unspecified abdominal pain: Secondary | ICD-10-CM | POA: Diagnosis not present

## 2018-09-13 DIAGNOSIS — E119 Type 2 diabetes mellitus without complications: Secondary | ICD-10-CM | POA: Insufficient documentation

## 2018-09-13 DIAGNOSIS — R011 Cardiac murmur, unspecified: Secondary | ICD-10-CM | POA: Diagnosis not present

## 2018-09-13 DIAGNOSIS — Z7901 Long term (current) use of anticoagulants: Secondary | ICD-10-CM | POA: Diagnosis not present

## 2018-09-13 DIAGNOSIS — Z7984 Long term (current) use of oral hypoglycemic drugs: Secondary | ICD-10-CM | POA: Diagnosis not present

## 2018-09-13 DIAGNOSIS — I10 Essential (primary) hypertension: Secondary | ICD-10-CM | POA: Insufficient documentation

## 2018-09-13 DIAGNOSIS — Z79899 Other long term (current) drug therapy: Secondary | ICD-10-CM | POA: Insufficient documentation

## 2018-09-13 DIAGNOSIS — E785 Hyperlipidemia, unspecified: Secondary | ICD-10-CM | POA: Diagnosis not present

## 2018-09-13 DIAGNOSIS — R1901 Right upper quadrant abdominal swelling, mass and lump: Secondary | ICD-10-CM

## 2018-09-13 DIAGNOSIS — R160 Hepatomegaly, not elsewhere classified: Secondary | ICD-10-CM

## 2018-09-13 DIAGNOSIS — Z853 Personal history of malignant neoplasm of breast: Secondary | ICD-10-CM | POA: Diagnosis not present

## 2018-09-13 DIAGNOSIS — K769 Liver disease, unspecified: Secondary | ICD-10-CM | POA: Insufficient documentation

## 2018-09-13 DIAGNOSIS — C50911 Malignant neoplasm of unspecified site of right female breast: Secondary | ICD-10-CM

## 2018-09-13 LAB — CBC WITH DIFFERENTIAL/PLATELET
Abs Immature Granulocytes: 0.02 10*3/uL (ref 0.00–0.07)
Basophils Absolute: 0 10*3/uL (ref 0.0–0.1)
Basophils Relative: 0 %
Eosinophils Absolute: 0.2 10*3/uL (ref 0.0–0.5)
Eosinophils Relative: 2 %
HCT: 42 % (ref 36.0–46.0)
Hemoglobin: 13 g/dL (ref 12.0–15.0)
Immature Granulocytes: 0 %
Lymphocytes Relative: 29 %
Lymphs Abs: 2 10*3/uL (ref 0.7–4.0)
MCH: 28.1 pg (ref 26.0–34.0)
MCHC: 31 g/dL (ref 30.0–36.0)
MCV: 90.9 fL (ref 80.0–100.0)
Monocytes Absolute: 0.6 10*3/uL (ref 0.1–1.0)
Monocytes Relative: 9 %
Neutro Abs: 4.1 10*3/uL (ref 1.7–7.7)
Neutrophils Relative %: 60 %
Platelets: 216 10*3/uL (ref 150–400)
RBC: 4.62 MIL/uL (ref 3.87–5.11)
RDW: 14.6 % (ref 11.5–15.5)
WBC: 6.9 10*3/uL (ref 4.0–10.5)
nRBC: 0 % (ref 0.0–0.2)

## 2018-09-13 LAB — COMPREHENSIVE METABOLIC PANEL
ALT: 13 U/L (ref 0–44)
AST: 25 U/L (ref 15–41)
Albumin: 4.2 g/dL (ref 3.5–5.0)
Alkaline Phosphatase: 52 U/L (ref 38–126)
Anion gap: 9 (ref 5–15)
BUN: 31 mg/dL — ABNORMAL HIGH (ref 8–23)
CO2: 26 mmol/L (ref 22–32)
Calcium: 9.5 mg/dL (ref 8.9–10.3)
Chloride: 106 mmol/L (ref 98–111)
Creatinine, Ser: 1.78 mg/dL — ABNORMAL HIGH (ref 0.44–1.00)
GFR calc Af Amer: 31 mL/min — ABNORMAL LOW (ref >60)
GFR calc non Af Amer: 27 mL/min — ABNORMAL LOW (ref >60)
Glucose, Bld: 83 mg/dL (ref 70–99)
Potassium: 5.4 mmol/L — ABNORMAL HIGH (ref 3.5–5.1)
Sodium: 141 mmol/L (ref 135–145)
Total Bilirubin: 0.7 mg/dL (ref 0.3–1.2)
Total Protein: 7.8 g/dL (ref 6.5–8.1)

## 2018-09-13 LAB — GLUCOSE, CAPILLARY: Glucose-Capillary: 78 mg/dL (ref 70–99)

## 2018-09-13 LAB — PROTIME-INR
INR: 1 (ref 0.8–1.2)
Prothrombin Time: 13.3 seconds (ref 11.4–15.2)

## 2018-09-13 MED ORDER — FENTANYL CITRATE (PF) 100 MCG/2ML IJ SOLN
INTRAMUSCULAR | Status: AC
  Administered 2018-09-13: 25 ug
  Filled 2018-09-13: qty 2

## 2018-09-13 MED ORDER — MIDAZOLAM HCL 2 MG/2ML IJ SOLN
INTRAMUSCULAR | Status: AC | PRN
  Administered 2018-09-13 (×3): 1 mg via INTRAVENOUS

## 2018-09-13 MED ORDER — MIDAZOLAM HCL 2 MG/2ML IJ SOLN
INTRAMUSCULAR | Status: AC
  Filled 2018-09-13: qty 4

## 2018-09-13 MED ORDER — LIDOCAINE HCL (PF) 1 % IJ SOLN
INTRAMUSCULAR | Status: AC | PRN
  Administered 2018-09-13: 10 mL

## 2018-09-13 MED ORDER — FENTANYL CITRATE (PF) 100 MCG/2ML IJ SOLN
INTRAMUSCULAR | Status: AC
  Filled 2018-09-13: qty 2

## 2018-09-13 MED ORDER — OXYCODONE-ACETAMINOPHEN 5-325 MG PO TABS
ORAL_TABLET | ORAL | Status: AC
  Filled 2018-09-13: qty 1

## 2018-09-13 MED ORDER — SODIUM CHLORIDE 0.9 % IV SOLN
INTRAVENOUS | Status: DC
  Administered 2018-09-13: 12:00:00 via INTRAVENOUS

## 2018-09-13 MED ORDER — FENTANYL CITRATE (PF) 100 MCG/2ML IJ SOLN
INTRAMUSCULAR | Status: AC | PRN
  Administered 2018-09-13 (×2): 50 ug via INTRAVENOUS

## 2018-09-13 MED ORDER — FENTANYL CITRATE (PF) 100 MCG/2ML IJ SOLN: 25.0000 ug | INTRAMUSCULAR | Status: DC

## 2018-09-13 MED ORDER — LIDOCAINE-EPINEPHRINE (PF) 2 %-1:200000 IJ SOLN
INTRAMUSCULAR | Status: AC
  Filled 2018-09-13: qty 20

## 2018-09-13 MED ORDER — GELATIN ABSORBABLE 12-7 MM EX MISC
CUTANEOUS | Status: AC
  Filled 2018-09-13: qty 1

## 2018-09-13 MED ORDER — DEXTROSE 5 % IV SOLN
INTRAVENOUS | Status: DC
  Administered 2018-09-13: 12:00:00 via INTRAVENOUS

## 2018-09-13 MED ORDER — OXYCODONE-ACETAMINOPHEN 5-325 MG PO TABS
1.0000 | ORAL_TABLET | Freq: Once | ORAL | Status: DC
  Administered 2018-09-13: 1 via ORAL

## 2018-09-13 NOTE — Discharge Instructions (Signed)
Liver Biopsy, Care After °These instructions give you information on caring for yourself after your procedure. Your doctor may also give you more specific instructions. Call your doctor if you have any problems or questions after your procedure. °What can I expect after the procedure? °After the procedure, it is common to have: °· Pain and soreness where the biopsy was done. °· Bruising around the area where the biopsy was done. °· Sleepiness and be tired for a few days. °Follow these instructions at home: °Medicines °· Take over-the-counter and prescription medicines only as told by your doctor. °· If you were prescribed an antibiotic medicine, take it as told by your doctor. Do not stop taking the antibiotic even if you start to feel better. °· Do not take medicines such as aspirin and ibuprofen. These medicines can thin your blood. Do not take these medicines unless your doctor tells you to take them. °· If you are taking prescription pain medicine, take actions to prevent or treat constipation. Your doctor may recommend that you: °? Drink enough fluid to keep your pee (urine) clear or pale yellow. °? Take over-the-counter or prescription medicines. °? Eat foods that are high in fiber, such as fresh fruits and vegetables, whole grains, and beans. °? Limit foods that are high in fat and processed sugars, such as fried and sweet foods. °Caring for your cut °· Follow instructions from your doctor about how to take care of your cuts from surgery (incisions). Make sure you: °? Wash your hands with soap and water before you change your bandage (dressing). If you cannot use soap and water, use hand sanitizer. °? Change your bandage as told by your doctor. °? Leave stitches (sutures), skin glue, or skin tape (adhesive) strips in place. They may need to stay in place for 2 weeks or longer. If tape strips get loose and curl up, you may trim the loose edges. Do not remove tape strips completely unless your doctor says it is  okay. °· Check your cuts every day for signs of infection. Check for: °? Redness, swelling, or more pain. °? Fluid or blood. °? Pus or a bad smell. °? Warmth. °· Do not take baths, swim, or use a hot tub until your doctor says it is okay to do so. °Activity ° °· Rest at home for 1-2 days or as told by your doctor. °? Avoid sitting for a long time without moving. Get up to take short walks every 1-2 hours. °· Return to your normal activities as told by your doctor. Ask what activities are safe for you. °· Do not do these things in the first 24 hours: °? Drive. °? Use machinery. °? Take a bath or shower. °· Do not lift more than 10 pounds (4.5 kg) or play contact sports for the first 2 weeks. °General instructions ° °· Do not drink alcohol in the first week after the procedure. °· Have someone stay with you for at least 24 hours after the procedure. °· Get your test results. Ask your doctor or the department that is doing the test: °? When will my results be ready? °? How will I get my results? °? What are my treatment options? °? What other tests do I need? °? What are my next steps? °· Keep all follow-up visits as told by your doctor. This is important. °Contact a doctor if: °· A cut bleeds and leaves more than just a small spot of blood. °· A cut is red, puffs up (  swells), or hurts more than before. °· Fluid or something else comes from a cut. °· A cut smells bad. °· You have a fever or chills. °Get help right away if: °· You have swelling, bloating, or pain in your belly (abdomen). °· You get dizzy or faint. °· You have a rash. °· You feel sick to your stomach (nauseous) or throw up (vomit). °· You have trouble breathing, feel short of breath, or feel faint. °· Your chest hurts. °· You have problems talking or seeing. °· You have trouble with your balance or moving your arms or legs. °Summary °· After the procedure, it is common to have pain, soreness, bruising, and tiredness. °· Your doctor will tell you how to  take care of yourself at home. Change your bandage, take your medicines, and limit your activities as told by your doctor. °· Call your doctor if you have symptoms of infection. Get help right away if your belly swells, your cut bleeds a lot, or you have trouble talking or breathing. °This information is not intended to replace advice given to you by your health care provider. Make sure you discuss any questions you have with your health care provider. °Document Released: 12/10/2007 Document Revised: 03/12/2017 Document Reviewed: 03/12/2017 °Elsevier Patient Education © 2020 [employer] °Moderate Conscious Sedation, Adult, Care After °These instructions provide you with information about caring for yourself after your procedure. Your health care provider may also give you more specific instructions. Your treatment has been planned according to current medical practices, but problems sometimes occur. Call your health care provider if you have any problems or questions after your procedure. °What can I expect after the procedure? °After your procedure, it is common: °· To feel sleepy for several hours. °· To feel clumsy and have poor balance for several hours. °· To have poor judgment for several hours. °· To vomit if you eat too soon. °Follow these instructions at home: °For at least 24 hours after the procedure: ° °· Do not: °? Participate in activities where you could fall or become injured. °? Drive. °? Use heavy machinery. °? Drink alcohol. °? Take sleeping pills or medicines that cause drowsiness. °? Make important decisions or sign legal documents. °? Take care of children on your own. °· Rest. °Eating and drinking °· Follow the diet recommended by your health care provider. °· If you vomit: °? Drink water, juice, or soup when you can drink without vomiting. °? Make sure you have little or no nausea before eating solid foods. °General instructions °· Have a responsible adult stay with you until you are awake  and alert. °· Take over-the-counter and prescription medicines only as told by your health care provider. °· If you smoke, do not smoke without supervision. °· Keep all follow-up visits as told by your health care provider. This is important. °Contact a health care provider if: °· You keep feeling nauseous or you keep vomiting. °· You feel light-headed. °· You develop a rash. °· You have a fever. °Get help right away if: °· You have trouble breathing. °This information is not intended to replace advice given to you by your health care provider. Make sure you discuss any questions you have with your health care provider. °Document Released: 12/21/2012 Document Revised: 02/12/2017 Document Reviewed: 06/22/2015 °Elsevier Patient Education © 2020 [employer] ° °

## 2018-09-13 NOTE — Telephone Encounter (Signed)
Scheduled appt per 6/30 sch message- unable to reach pt . Left message with appt date and time

## 2018-09-13 NOTE — Progress Notes (Signed)
Rowe Robert PA in to re-evaluate patient, order received for pain medication, OK to discharge to home when pain is better.

## 2018-09-13 NOTE — Procedures (Signed)
Pre Procedure Dx: Liver lesion Post Procedural Dx: Same  Technically successful US guided biopsy of infiltrative lesion adjacent to the gall bladder fossa.   EBL: None  No immediate complications.   Ronny Bacon, MD Pager #: [phone]

## 2018-09-13 NOTE — Consult Note (Signed)
Chief Complaint: Patient was seen in consultation today for image guided liver lesion biopsy  Referring Physician(s): [name],G  Supervising Physician: Sandi Mariscal  Patient Status: [facility] - Out-pt  History of Present Illness: Donna May is a 78 y.o. female with history of right breast cancer in 1992, status post mastectomy and lymph node dissection. Follow up imaging studies on 09/07/2018 to eval rt axillary discomfort revealed a new enhancing mass involving segment 4 of the liver and fundus of the gallbladder concerning for malignancy.  Request now received for image guided liver lesion biopsy for further evaluation.  Past Medical History:  Diagnosis Date  . Anemia   . Arthritis   . Breast cancer ([place]) 1992  . Diabetes mellitus    type 2  . Duodenitis 01/18/2002  . Fainting spell   . GERD (gastroesophageal reflux disease)   . Heart murmur   . Hiatal hernia 08/08/2008, 01/18/2002  . History of pneumonia   . Hyperlipidemia   . Hypertension   . Lymphedema 2017   Right arm  . UTI (lower urinary tract infection)     Past Surgical History:  Procedure Laterality Date  . AXILLARY SURGERY     cyst removal, right  . MASTECTOMY  1992   right, with flap  . NM MYOCAR PERF WALL MOTION  06/11/2009   Protocol:[place], post stress EF58%, EKG negative for ischemia, low risk  . TONSILLECTOMY    . TRANSTHORACIC ECHOCARDIOGRAM  12/24/2009   LVEF =>55%, normal study    Allergies: Patient has no known allergies.  Medications: Prior to Admission medications   Medication Sig Start Date End Date Taking? Authorizing Provider  amLODipine (NORVASC) 10 MG tablet TAKE 1 TABLET BY MOUTH EVERY DAY 01/10/18  Yes [name], Mihai, MD  aspirin 81 MG tablet Take 81 mg by mouth daily as needed.    Yes [provider]  CALCIUM-MAGNESIUM-VITAMIN D PO Take 1 tablet by mouth daily.   Yes [provider]  Cholecalciferol (VITAMIN D3) 2000 UNITS TABS Take 1 tablet by mouth daily as  needed.    Yes [provider]  Echinacea 400 MG CAPS Take 1 capsule by mouth daily.   Yes [provider]  glucose blood (ACCU-CHEK AVIVA) test strip Use as instructed 09/06/17  Yes Marletta Lor, MD  hydrochlorothiazide (HYDRODIURIL) 25 MG tablet Take 1 tablet (25 mg total) by mouth daily. 01/27/18 09/13/18 Yes [name], Mihai, MD  lansoprazole (PREVACID) 15 MG capsule Take 1 capsule (15 mg total) by mouth daily before breakfast. 04/07/18  Yes [name], [name], MD  metFORMIN (GLUCOPHAGE-XR) 500 MG 24 hr tablet Take 1 tablet (500 mg total) by mouth 2 (two) times daily before a meal. 08/01/18  Yes [name], MD  metoprolol succinate (TOPROL-XL) 100 MG 24 hr tablet TAKE 1 TABLET (100 MG TOTAL) BY MOUTH DAILY. TAKE WITH OR IMMEDIATELY FOLLOWING A MEAL. 01/10/18  Yes [name], Mihai, MD  Omega 3 1000 MG CAPS Take 1 capsule by mouth daily.   Yes [provider]  raloxifene (EVISTA) 60 MG tablet TAKE 1 TABLET DAILY 05/31/18  Yes [name], MD  rosuvastatin (CRESTOR) 20 MG tablet Take 1 tablet (20 mg total) by mouth daily. 09/28/17  Yes Marletta Lor, MD  sitaGLIPtin (JANUVIA) 100 MG tablet Take 1 tablet (100 mg total) by mouth daily. 09/06/17  Yes Marletta Lor, MD  triamcinolone cream (KENALOG) 0.1 % Apply 1 application topically 2 (two) times daily. 09/06/17  Yes Marletta Lor, MD  valsartan (DIOVAN) 320 MG tablet Take 1 tablet (320 mg total) by mouth daily. 01/27/18  Yes [name], Mihai, MD  ketoconazole (NIZORAL) 2 % cream Apply 1 fingertip amount to each foot daily. Patient taking differently: as needed. Apply 1 fingertip amount to each foot daily. 09/23/17   Evelina Bucy, DPM  Multiple Vitamins-Minerals (CENTRUM SILVER ULTRA WOMENS) TABS Take 1 tablet by mouth daily as needed.     [provider]  potassium chloride SA (K-DUR) 20 MEQ tablet Take 1 tablet (20 mEq total) by mouth daily for 5 days. 09/02/18 09/07/18  [name], MD  promethazine-dextromethorphan (PROMETHAZINE-DM) 6.25-15 MG/5ML syrup Take 5 mLs by mouth 3 (three) times daily as needed for cough. 04/25/18   Jaynee Eagles, PA-C     Family History  Problem Relation Age of Onset  . Breast cancer Cousin   . Diabetes Brother   . Heart disease Mother   . Hypertension Mother   . Heart disease Father   . Hypertension Father   . Prostate cancer Brother   . Heart attack Maternal Grandmother   . Stroke Maternal Grandfather     Social History   Socioeconomic History  . Marital status: Single    Spouse name: Not on file  . Number of children: 2  . Years of education: Not on file  . Highest education level: Not on file  Occupational History  . Occupation: retired  Scientific laboratory technician  . Financial resource strain: Not hard at all  . Food insecurity    Worry: Never true    Inability: Never true  . Transportation needs    Medical: No    Non-medical: No  Tobacco Use  . Smoking status: Former Smoker    Packs/day: 0.01    Years: 10.00    Pack years: 0.10  . Smokeless tobacco: Never Used  . Tobacco comment: quit 78 yo;   Substance and Sexual Activity  . Alcohol use: Yes    Alcohol/week: 1.0 standard drinks    Types: 1 Glasses of wine per week    Comment: occ  . Drug use: No  . Sexual activity: Yes    Partners: Male    Birth control/protection: Condom    Comment: First sexual encounter age 51. Fewer than 5 partners in life time.  Lifestyle  . Physical activity    Days per week: 0 days    Minutes per session: 0 min  . Stress: Not at all  Relationships  . Social Herbalist on phone: Never    Gets together: Not on file    Attends religious service: More than 4 times per year    Active member of club or organization: No    Attends meetings of clubs or organizations: Never    Relationship status: Not on file  Other Topics Concern  . 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       Review of Systems she denies fever, headache, chest pain, dyspnea, cough, back pain, nausea, vomiting or bleeding.  She does have some occasional abdominal discomfort.  Vital Signs: BP (!) 156/64   Pulse 61   Temp 98.3 F (36.8 C) (Oral)   Ht 5\' 5"  (1.651  m)   Wt 155 lb (70.3 kg)   SpO2 100%   BMI 25.79 kg/m   Physical Exam awake, alert.  Chest clear to auscultation bilaterally.  Heart with regular rate/ rhythm.  Abdomen soft, positive bowel sounds, currently nontender.  No lower extremity edema.  Right arm lymphedema.  Imaging: Ct Chest W Contrast  Result Date: 09/07/2018 CLINICAL DATA:  Follow-up breast cancer. EXAM: CT CHEST WITH CONTRAST TECHNIQUE: Multidetector CT imaging of the chest was performed during intravenous contrast administration. CONTRAST:  33mL OMNIPAQUE IOHEXOL 300 MG/ML  SOLN COMPARISON:  12/02/15 FINDINGS: Cardiovascular: Heart size is normal. No pericardial effusion. Aortic atherosclerosis. Lad coronary artery calcifications. Mediastinum/Nodes: Normal appearance of the thyroid gland. The trachea appears patent and is midline. Normal appearance of the esophagus. No mediastinal, hilar, axillary, or supraclavicular adenopathy. Lungs/Pleura: No pleural effusion.  Lungs are clear. Upper Abdomen: Partially visualized enhancing mass within segment 4 of the liver measures 6.2 x 3.8 by 4.8 cm. Mass involves gallbladder fundus. New from 12/02/2015. Scattered liver cysts are again noted. Musculoskeletal: No chest wall abnormality. No acute or significant osseous findings. IMPRESSION: 1. New, enhancing mass involving segment 4 of the liver and fundus of gallbladder is concerning for malignancy. This may represent either metastatic disease from breast cancer or neoplasm primary to the liver or hepatic  biliary tree. Further evaluation with contrast enhanced CT of the abdomen and pelvis is recommended. 2. No findings to suggest metastatic disease within the chest. 3.  Aortic Atherosclerosis (ICD10-I70.0). 4. Coronary artery calcifications. Electronically Signed   By: Kerby Moors M.D.   On: 09/07/2018 15:32    Labs:  CBC: Recent Labs    09/13/18 1123  WBC 6.9  HGB 13.0  HCT 42.0  PLT 216    COAGS: Recent Labs    09/13/18 1123  INR 1.0    BMP: Recent Labs    10/07/17 1251 09/01/18 1435  NA 144 141  K 4.2 3.3*  CL 105 103  CO2 26 31  GLUCOSE 89 83  BUN 18 21  CALCIUM 9.5 9.4  CREATININE 1.21* 1.40*  GFRNONAA 43*  --   GFRAA 50*  --     LIVER FUNCTION TESTS: Recent Labs    10/07/17 1251 09/01/18 1435  BILITOT 0.3 0.3  AST 15 14  ALT 12 11  ALKPHOS 56 51  PROT 6.6 6.6  ALBUMIN 4.2 4.0    TUMOR MARKERS: No results for input(s): AFPTM, CEA, CA199, CHROMGRNA in the last 8760 hours.  Assessment and Plan:  78 y.o. female with history of right breast cancer in 1992, status post mastectomy and lymph node dissection. Follow up imaging studies on 09/07/2018 to eval rt axillary discomfort revealed a new enhancing mass involving segment 4 of the liver and fundus of the gallbladder concerning for malignancy.  Request now received for image guided liver lesion biopsy for further evaluation.Risks and benefits of procedure was discussed with the patient  including, but not limited to bleeding, infection, damage to adjacent structures or low yield requiring additional tests.  All of the questions were answered and there is agreement to proceed.  Consent signed and in chart.     Thank you for this interesting consult.  I greatly enjoyed meeting MILA PAIR and look forward to participating in their care.  A copy of this report was sent to the requesting provider on this date.  Electronically Signed: D. Rowe Robert, PA-C 09/13/2018, 11:47 AM   I spent a total of  25 minutes   in face to face in clinical consultation, greater than 50% of which was counseling/coordinating care for image guided liver lesion biopsy

## 2018-09-13 NOTE — Progress Notes (Signed)
Rowe Robert PA, notified of pt.'s c/o pain in abdomen, vital signs stable, dressing no change. Orders received after in to evaluate patient.

## 2018-09-13 NOTE — Telephone Encounter (Signed)
LM for pt to call back to prescreen for 7-1 appt

## 2018-09-14 ENCOUNTER — Encounter: Payer: Self-pay | Admitting: Family Medicine

## 2018-09-14 ENCOUNTER — Encounter: Payer: Self-pay | Admitting: Gastroenterology

## 2018-09-14 ENCOUNTER — Other Ambulatory Visit: Payer: Self-pay

## 2018-09-14 ENCOUNTER — Ambulatory Visit (INDEPENDENT_AMBULATORY_CARE_PROVIDER_SITE_OTHER): Payer: Medicare Other | Admitting: Family Medicine

## 2018-09-14 ENCOUNTER — Ambulatory Visit (INDEPENDENT_AMBULATORY_CARE_PROVIDER_SITE_OTHER): Payer: Medicare Other | Admitting: Gastroenterology

## 2018-09-14 VITALS — Ht 65.0 in | Wt 154.0 lb

## 2018-09-14 VITALS — BP 138/80 | HR 57 | Temp 97.6°F | Wt 154.0 lb

## 2018-09-14 DIAGNOSIS — E0822 Diabetes mellitus due to underlying condition with diabetic chronic kidney disease: Secondary | ICD-10-CM

## 2018-09-14 DIAGNOSIS — Z1211 Encounter for screening for malignant neoplasm of colon: Secondary | ICD-10-CM

## 2018-09-14 DIAGNOSIS — E875 Hyperkalemia: Secondary | ICD-10-CM

## 2018-09-14 DIAGNOSIS — Z9889 Other specified postprocedural states: Secondary | ICD-10-CM

## 2018-09-14 DIAGNOSIS — R198 Other specified symptoms and signs involving the digestive system and abdomen: Secondary | ICD-10-CM | POA: Diagnosis not present

## 2018-09-14 DIAGNOSIS — R160 Hepatomegaly, not elsewhere classified: Secondary | ICD-10-CM | POA: Diagnosis not present

## 2018-09-14 DIAGNOSIS — N183 Chronic kidney disease, stage 3 (moderate): Secondary | ICD-10-CM

## 2018-09-14 NOTE — Progress Notes (Signed)
prescreened

## 2018-09-14 NOTE — Progress Notes (Signed)
Subjective:    Patient ID: Donna May, female    DOB: [birth year], 78 y.o.   MRN: [id number]  No chief complaint on file.   HPI Patient was seen today for f/u.  Pt completed 5 days of potassium replacement.  Pt s/p a liver biopsy and repeat labs on 09/13/18.  Pt states she is doing well but having some pain inferior to the biopsy site, for which she took tylenol.  Pt states she does not know why the biopsy was done.  Pt following with Dr. Dwyane Dee, Endo for DM.  States wants to have her Hgb A1C done q 3 months.  Taking Metformin XR and Januvia.  Will switch from [place] to [place] when finished with current supply.  Past Medical History:  Diagnosis Date  . Anemia   . Arthritis   . Breast cancer ([place]) 1992  . Diabetes mellitus    type 2  . Duodenitis 01/18/2002  . Fainting spell   . GERD (gastroesophageal reflux disease)   . Heart murmur   . Hiatal hernia 08/08/2008, 01/18/2002  . History of pneumonia   . Hyperlipidemia   . Hypertension   . Lymphedema 2017   Right arm  . UTI (lower urinary tract infection)     No Known Allergies  ROS General: Denies fever, chills, night sweats, changes in weight, changes in appetite HEENT: Denies headaches, ear pain, changes in vision, rhinorrhea, sore throat CV: Denies CP, palpitations, SOB, orthopnea Pulm: Denies SOB, cough, wheezing GI: Denies nausea, vomiting, diarrhea, constipation  +abdominal pain s/p liver biopsy. GU: Denies dysuria, hematuria, frequency, vaginal discharge Msk: Denies muscle cramps, joint pains Neuro: Denies weakness, numbness, tingling Skin: Denies rashes, bruising Psych: Denies depression, anxiety, hallucinations      Objective:    Blood pressure 138/80, pulse (!) 57, temperature 97.6 F (36.4 C), temperature source Oral, weight 154 lb (69.9 kg), SpO2 93 %.   Gen. Pleasant, well-nourished, in no distress, normal affect   HEENT: [place]/AT, face symmetric, conjunctiva clear, no scleral icterus, PERRLA, EOMI, nares  patent without drainage Lungs: no accessory muscle use, no wheezes or rales Cardiovascular: RRR,  no peripheral edema Abdomen: BS present, soft, NT/ND, no hepatosplenomegaly.  Bandaid removed at site of liver biopsy.  No erythema, induration, increased warmth, ecchymosis, or drainage.  Pt given a new bandaid. Musculoskeletal: No deformities, no cyanosis or clubbing, normal tone Neuro:  A&Ox3, CN II-XII intact, normal gait Skin:  Warm, no lesions/ rash   Wt Readings from Last 3 Encounters:  09/14/18 154 lb (69.9 kg)  09/13/18 155 lb (70.3 kg)  09/01/18 157 lb (71.2 kg)    Lab Results  Component Value Date   WBC 6.9 09/13/2018   HGB 13.0 09/13/2018   HCT 42.0 09/13/2018   PLT 216 09/13/2018   GLUCOSE 83 09/13/2018   CHOL 190 09/01/2018   TRIG 46.0 09/01/2018   HDL 60.50 09/01/2018   LDLCALC 120 (H) 09/01/2018   ALT 13 09/13/2018   AST 25 09/13/2018   NA 141 09/13/2018   K 5.4 (H) 09/13/2018   CL 106 09/13/2018   CREATININE 1.78 (H) 09/13/2018   BUN 31 (H) 09/13/2018   CO2 26 09/13/2018   TSH 1.48 04/26/2017   INR 1.0 09/13/2018   HGBA1C 6.2 (A) 09/01/2018   MICROALBUR 0.9 09/01/2018    Assessment/Plan:  Diabetes mellitus due to underlying condition with stage 3 chronic kidney disease, without long-term current use of insulin (HCC) -GFR 31 and creatinine 1.78 on 09/13/2018 -discussed CKD  with pt.  Advised to avoid NSAIDs and other nephrotoxic meds.  t to increase p.o. intake of water -discussed lifestyle modifications -pt informed no indication to check Hgb A1C q 3 months if Hgb A1C controlled.  Pt does not agree. -continue f/u with Dr. Dwyane Dee -given handout - Plan: Ambulatory referral to Nephrology  History of liver biopsy -Procedure done 09/13/2018 -Pathology pending -Reviewed in detail with patient reason for CT scan and ultimately biopsy as pt complained of right axilla/rib cage pain during oncology visit.  Hyperkalemia -Noted on chart review of labs from  09/13/18 -Previously hypokalemic on labs from 09/01/18.  Completed short course of K dur. -We will continue to monitor.  Suggest repeat BMP in next few days to 1 wk.  F/u in 3-4 months, sooner if needed.  Grier Mitts, MD

## 2018-09-14 NOTE — Progress Notes (Signed)
THIS ENCOUNTER IS A VIRTUAL VISIT DUE TO COVID-19 - PATIENT WAS NOT SEEN IN THE OFFICE. PATIENT HAS CONSENTED TO VIRTUAL VISIT / TELEMEDICINE VISIT   Location of patient: home Location of provider: office Persons participating: myself, patient Time spent: 22 minutes   HPI :  78 y/o female here for a follow up visit. She has a remote history of breat cancer.  She had a CT chest on 09/06/08 to follow up her breast cancer, and incidentally noted was a mass in segment 4 of the liver, 6.2 x 3.8 x 4.8cm, involving the gallbladder fundus. She had a follow up liver biopsy with IR yesterday. She endorses some soreness at the site but recovering okay.   On review if her chart, her liver enzymes have been normal and continue to be normal. She denies any pains at baseline. She has lost some weight in the past month unintentionally, about 8-10 lbs or so. She denies any FH of liver disease or malignancies.   No problems with bowels other than constipation at times and then sometimes normal, and rare occasion of loose stools. If she drinks water routinely her bowels are more regular. No blood in the stools. She has some superificial blood on the toilet paper when wiping herself if constipated. She is not taking anything for her bowels. No anemia. Hgb 13.0. She had her last colonoscopy 10 years ago, wonders if she should have another exam, she would like one.  She is taking Prevacid 15mg  once daily. It controls reflux pretty well. She has reduced from 30mg  to 15mg  without any breakthrough. No dysphagia. Last EGD 2010 showed a hiatal hernia without Barrett's.   No FH of malignancy. She thinks she just started losing weight, now lost 8-9 lbs in the past month.    Endoscopic history: Colonoscopy 2010 - normal EGD May 2010 - 4-5 cm hiatal hernia   Past Medical History:  Diagnosis Date  . Anemia   . Arthritis   . Breast cancer ([place]) 1992  . Diabetes mellitus    type 2  . Duodenitis 01/18/2002  .  Fainting spell   . GERD (gastroesophageal reflux disease)   . Heart murmur   . Hiatal hernia 08/08/2008, 01/18/2002  . History of pneumonia   . Hyperlipidemia   . Hypertension   . Lymphedema 2017   Right arm  . UTI (lower urinary tract infection)      Past Surgical History:  Procedure Laterality Date  . AXILLARY SURGERY     cyst removal, right  . LIVER BIOPSY  08/2018   Dr. Lindwood Coke  . MASTECTOMY  1992   right, with flap  . NM MYOCAR PERF WALL MOTION  06/11/2009   Protocol:[place], post stress EF58%, EKG negative for ischemia, low risk  . TONSILLECTOMY    . TRANSTHORACIC ECHOCARDIOGRAM  12/24/2009   LVEF =>55%, normal study   Family History  Problem Relation Age of Onset  . Breast cancer Cousin   . Diabetes Brother   . Heart disease Mother   . Hypertension Mother   . Heart disease Father   . Hypertension Father   . Prostate cancer Brother   . Heart attack Maternal Grandmother   . Stroke Maternal Grandfather   . Colon cancer Neg Hx    Social History   Tobacco Use  . Smoking status: Former Smoker    Packs/day: 0.01    Years: 10.00    Pack years: 0.10  . Smokeless tobacco: Never Used  . Tobacco  comment: quit 78 yo;   Substance Use Topics  . Alcohol use: Not Currently    Alcohol/week: 1.0 standard drinks    Types: 1 Glasses of wine per week  . Drug use: No   Current Outpatient Medications  Medication Sig Dispense Refill  . amLODipine (NORVASC) 10 MG tablet TAKE 1 TABLET BY MOUTH EVERY DAY 90 tablet 2  . aspirin 81 MG tablet Take 81 mg by mouth daily.     Marland Kitchen CALCIUM-MAGNESIUM-VITAMIN D PO Take 1 tablet by mouth daily.    . Cholecalciferol (VITAMIN D3) 2000 UNITS TABS Take 1 tablet by mouth daily as needed.     . Echinacea 400 MG CAPS Take 1 capsule by mouth daily.    Marland Kitchen glucose blood (ACCU-CHEK AVIVA) test strip Use as instructed 100 each 4  . ketoconazole (NIZORAL) 2 % cream Apply 1 fingertip amount to each foot daily. (Patient taking differently: as needed. Apply  1 fingertip amount to each foot daily.) 30 g 0  . lansoprazole (PREVACID) 15 MG capsule Take 1 capsule (15 mg total) by mouth daily before breakfast. 90 capsule 0  . metFORMIN (GLUCOPHAGE-XR) 500 MG 24 hr tablet Take 1 tablet (500 mg total) by mouth 2 (two) times daily before a meal. 180 tablet 1  . metoprolol succinate (TOPROL-XL) 100 MG 24 hr tablet TAKE 1 TABLET (100 MG TOTAL) BY MOUTH DAILY. TAKE WITH OR IMMEDIATELY FOLLOWING A MEAL. 90 tablet 1  . Multiple Vitamins-Minerals (CENTRUM SILVER ULTRA WOMENS) TABS Take 1 tablet by mouth daily as needed.     . Omega 3 1000 MG CAPS Take 2 capsules by mouth daily.     . promethazine-dextromethorphan (PROMETHAZINE-DM) 6.25-15 MG/5ML syrup Take 5 mLs by mouth 3 (three) times daily as needed for cough. 100 mL 0  . raloxifene (EVISTA) 60 MG tablet TAKE 1 TABLET DAILY 90 tablet 0  . rosuvastatin (CRESTOR) 20 MG tablet Take 1 tablet (20 mg total) by mouth daily. 90 tablet 0  . sitaGLIPtin (JANUVIA) 100 MG tablet Take 1 tablet (100 mg total) by mouth daily. 90 tablet 4  . triamcinolone cream (KENALOG) 0.1 % Apply 1 application topically 2 (two) times daily. 30 g 0  . valsartan (DIOVAN) 320 MG tablet Take 1 tablet (320 mg total) by mouth daily. 90 tablet 1  . hydrochlorothiazide (HYDRODIURIL) 25 MG tablet Take 1 tablet (25 mg total) by mouth daily. 90 tablet 1   Current Facility-Administered Medications  Medication Dose Route Frequency Provider Last Rate Last Dose  . triamcinolone acetonide (KENALOG) 10 MG/ML injection 10 mg  10 mg Other Once Harriet Masson, DPM       No Known Allergies   Review of Systems: All systems reviewed and negative except where noted in HPI.    Ct Chest W Contrast  Result Date: 09/07/2018 CLINICAL DATA:  Follow-up breast cancer. EXAM: CT CHEST WITH CONTRAST TECHNIQUE: Multidetector CT imaging of the chest was performed during intravenous contrast administration. CONTRAST:  39mL OMNIPAQUE IOHEXOL 300 MG/ML  SOLN COMPARISON:   12/02/15 FINDINGS: Cardiovascular: Heart size is normal. No pericardial effusion. Aortic atherosclerosis. Lad coronary artery calcifications. Mediastinum/Nodes: Normal appearance of the thyroid gland. The trachea appears patent and is midline. Normal appearance of the esophagus. No mediastinal, hilar, axillary, or supraclavicular adenopathy. Lungs/Pleura: No pleural effusion.  Lungs are clear. Upper Abdomen: Partially visualized enhancing mass within segment 4 of the liver measures 6.2 x 3.8 by 4.8 cm. Mass involves gallbladder fundus. New from 12/02/2015. Scattered liver cysts are again  noted. Musculoskeletal: No chest wall abnormality. No acute or significant osseous findings. IMPRESSION: 1. New, enhancing mass involving segment 4 of the liver and fundus of gallbladder is concerning for malignancy. This may represent either metastatic disease from breast cancer or neoplasm primary to the liver or hepatic biliary tree. Further evaluation with contrast enhanced CT of the abdomen and pelvis is recommended. 2. No findings to suggest metastatic disease within the chest. 3.  Aortic Atherosclerosis (ICD10-I70.0). 4. Coronary artery calcifications. Electronically Signed   By: Kerby Moors M.D.   On: 09/07/2018 15:32   US Biopsy (liver)  Result Date: 09/13/2018 INDICATION: History of breast cancer, now with infiltrative mass adjacent to the gallbladder fossa. Please from ultrasound-guided liver lesion biopsy for tissue diagnostic purposes. EXAM: ULTRASOUND GUIDED LIVER LESION BIOPSY COMPARISON:  Chest CT-09/07/2018 MEDICATIONS: None ANESTHESIA/SEDATION: Fentanyl 100 mcg IV; Versed 3 mg IV Total Moderate Sedation time:  10 Minutes. The patient's level of consciousness and vital signs were monitored continuously by radiology nursing throughout the procedure under my direct supervision. COMPLICATIONS: None immediate. PROCEDURE: Informed written consent was obtained from the patient after a discussion of the risks,  benefits and alternatives to treatment. The patient understands and consents the procedure. A timeout was performed prior to the initiation of the procedure. Ultrasound scanning was performed of the right upper abdominal quadrant demonstrates a large infiltrative mass adjacent to the gallbladder fossa compatible the findings seen on preceding CT scan the abdomen pelvis. The procedure was planned. The right upper abdominal quadrant was prepped and draped in the usual sterile fashion. The overlying soft tissues were anesthetized with 1% lidocaine with epinephrine. A 17 gauge, 6.8 cm co-axial needle was advanced into a peripheral aspect of the lesion. This was followed by 4 core biopsies with an 18 gauge core device under direct ultrasound guidance. The coaxial needle tract was embolized with a small amount of Gel-Foam slurry and superficial hemostasis was obtained with manual compression. Post procedural scanning was negative for definitive area of hemorrhage or additional complication. A dressing was placed. The patient tolerated the procedure well without immediate post procedural complication. IMPRESSION: Technically successful ultrasound guided core needle biopsy of infiltrative mass adjacent to the gallbladder fossa. Electronically Signed   By: Sandi Mariscal M.D.   On: 09/13/2018 15:16    Physical Exam: Ht 5\' 5"  (1.651 m)   Wt 154 lb (69.9 kg)   BMI 25.63 kg/m  NA   ASSESSMENT AND PLAN: 78 y/o female here for reassessment of the following issues:  Liver mass / irregular bowel habits / colon cancer screening - concerning liver mass noted on CT scan of the chest. She is s/p liver biopsy with IR yesterday to clarify what this is. That result should be back quite soon and will await that result. She likely needs a CT abdomen / pelvis to complete her staging of this. She inquires about a screening colonoscopy. She had been relatively in good health and wanted to proceed with screening purposes. If this  liver mass appears to be a metastatic adenocarcinoma a colonoscopy will be needed to rule out primary colon cancer. Otherwise if this is another type of malignancy, we discussed she may want to have that evaluated completely first prior to consideration for colonoscopy, at her age we don't need to proceed with further colon cancer screening unless perhaps related to this liver lesion. She agreed. Otherwise, recommend daily fiber supplement for her bowel function. I will touch base with her oncologist and get back to  her, await path results.  [place] Cellar, MD [facility]  Total time spent 22 minutes

## 2018-09-14 NOTE — Patient Instructions (Signed)
Chronic Kidney Disease, Adult °Chronic kidney disease (CKD) happens when the kidneys are damaged over a long period of time. The kidneys are two organs that help with: °· Getting rid of waste and extra fluid from the blood. °· Making hormones that maintain the amount of fluid in your tissues and blood vessels. °· Making sure that the body has the right amount of fluids and chemicals. °Most of the time, CKD does not go away, but it can usually be controlled. Steps must be taken to slow down the kidney damage or to stop it from getting worse. If this is not done, the kidneys may stop working. °Follow these instructions at home: °Medicines °· Take over-the-counter and prescription medicines only as told by your doctor. You may need to change the amount of medicines you take. °· Do not take any new medicines unless your doctor says it is okay. Many medicines can make your kidney damage worse. °· Do not take any vitamin and supplements unless your doctor says it is okay. Many vitamins and supplements can make your kidney damage worse. °General instructions °· Follow a diet as told by your doctor. You may need to stay away from: °? Alcohol. °? Salty foods. °? Foods that are high in: °§ Potassium. °§ Calcium. °§ Protein. °· Do not use any products that contain nicotine or tobacco, such as cigarettes and e-cigarettes. If you need help quitting, ask your doctor. °· Keep track of your blood pressure at home. Tell your doctor about any changes. °· If you have diabetes, keep track of your blood sugar as told by your doctor. °· Try to stay at a healthy weight. If you need help, ask your doctor. °· Exercise at least 30 minutes a day, 5 days a week. °· Stay up-to-date with your shots (immunizations) as told by your doctor. °· Keep all follow-up visits as told by your doctor. This is important. °Contact a doctor if: °· Your symptoms get worse. °· You have new symptoms. °Get help right away if: °· You have symptoms of end-stage  kidney disease. These may include: °? Headaches. °? Numbness in your hands or feet. °? Easy bruising. °? Having hiccups often. °? Chest pain. °? Shortness of breath. °? Stopping of menstrual periods in women. °· You have a fever. °· You have very little pee (urine). °· You have pain or bleeding when you pee. °Summary °· Chronic kidney disease (CKD) happens when the kidneys are damaged over a long period of time. °· Most of the time, this condition does not go away, but it can usually be controlled. Steps must be taken to slow down the kidney damage or to stop it from getting worse. °· Treatment may include a combination of medicines and lifestyle changes. °This information is not intended to replace advice given to you by your health care provider. Make sure you discuss any questions you have with your health care provider. °Document Released: 05/27/2009 Document Revised: 02/12/2017 Document Reviewed: 04/06/2016 °Elsevier Patient Education © 2020 [employer] ° °

## 2018-09-15 ENCOUNTER — Other Ambulatory Visit: Payer: Self-pay | Admitting: Oncology

## 2018-09-15 DIAGNOSIS — E119 Type 2 diabetes mellitus without complications: Secondary | ICD-10-CM | POA: Diagnosis not present

## 2018-09-15 DIAGNOSIS — H43812 Vitreous degeneration, left eye: Secondary | ICD-10-CM | POA: Diagnosis not present

## 2018-09-15 DIAGNOSIS — H04123 Dry eye syndrome of bilateral lacrimal glands: Secondary | ICD-10-CM | POA: Diagnosis not present

## 2018-09-15 DIAGNOSIS — Z961 Presence of intraocular lens: Secondary | ICD-10-CM | POA: Diagnosis not present

## 2018-09-15 DIAGNOSIS — H26493 Other secondary cataract, bilateral: Secondary | ICD-10-CM | POA: Diagnosis not present

## 2018-09-15 LAB — HM DIABETES EYE EXAM

## 2018-09-19 ENCOUNTER — Ambulatory Visit (HOSPITAL_COMMUNITY): Payer: Medicare Other

## 2018-09-20 ENCOUNTER — Inpatient Hospital Stay: Payer: Medicare Other

## 2018-09-20 ENCOUNTER — Other Ambulatory Visit: Payer: Self-pay | Admitting: Oncology

## 2018-09-20 ENCOUNTER — Inpatient Hospital Stay: Payer: Medicare Other | Admitting: Oncology

## 2018-09-20 DIAGNOSIS — C221 Intrahepatic bile duct carcinoma: Secondary | ICD-10-CM | POA: Insufficient documentation

## 2018-09-20 NOTE — Progress Notes (Addendum)
[name]  [phone] Fax:([phone]) [place] Note   Patient Care Team: [name], MD as PCP - General (Family Medicine) [name], [name] Dad, MD as Consulting Physician (Oncology) [name], [name], MD as Consulting Physician (Cardiology) [name], MD as Consulting Physician (Obstetrics and Gynecology) [name], [name], NP as Nurse Practitioner (Hematology and Oncology) [facility] [name], MD as Consulting Physician (Hematology) 09/21/2018  CHIEF COMPLAINTS/PURPOSE OF CONSULTATION:  Cholangiocarcinoma, referred by Dr. Jana Hakim    Oncology History  Malignant neoplasm of female breast [facility])  Cholangiocarcinoma ([place])  09/07/2018 Imaging   CT Chest IMPRESSION: 1. New, enhancing mass involving segment 4 of the liver and fundus of gallbladder is concerning for malignancy. This may represent either metastatic disease from breast cancer or neoplasm primary to the liver or hepatic biliary tree. Further evaluation with contrast enhanced CT of the abdomen and pelvis is recommended. 2. No findings to suggest metastatic disease within the chest. 3.  Aortic Atherosclerosis (ICD10-I70.0). 4. Coronary artery calcifications.   09/13/2018 Pathology Results   Diagnosis Liver, needle/core biopsy - ADENOCARCINOMA. Microscopic Comment Immunohistochemistry for CK7 is positive. CK20, TTF1, CDX-2, GATA-3, PAX 8, Qualitative ER, p63 and CK5/6 are negative. The provided clinical history of remote mammary carcinoma is noted. Based on the morphology and immunophenotype of the adenocarcinoma observed in this specimen, primary cholangiocarcinoma is favored. Clinical and radiologic correlation are  encouraged. Results reported to [employer] on 09/15/2018. Intradepartmental consultation (Dr. Vic Ripper).   09/13/2018 Initial Diagnosis   Cholangiocarcinoma (HCC)     HISTORY OF PRESENTING ILLNESS:  Donna May 78 y.o.  female with h/o noninvasive right breast cancer in 1992 s/p mastectomy and ALND under [name] followed by my colleague Dr. Jana Hakim. She was seen in long term survivorship clinic last month and reported to Thedore Mins NP she had right axillary pain with h/o lymphedema and new onset left low rib cage pain. CT chest on 09/07/18 showed new enhancing mass involving segment 4 of the liver and fundus of gallbladder concerning for malignancy. No evidence of metastasis was found in the chest. US biopsy of the liver on 09/13/18 positive for adenocarcinoma. IHC is positive for CK7 and negative for CK20, TTF1, CDX-2, GATA-3, PAX 8, qualitative ER, p63, and CK5/6. Primary cholangiocarcinoma is favored. She was seen by GI Dr. Havery Moros virtually to discuss screening colonoscopy on 09/14/18.   PMH is significant for right breast cancer s/p R mastectomy in 1992 with tram flap reconstruction, controlled DM, HTN, HL, GERD, hiatal hernia, and renal artery stenosis. She lives alone but is completely independent. She drives herself. Works on school bus. She drinks alcohol occasionally. Quit smoking 30 years ago, smoked <1PPD. She has 2 healthy children. Family history of cancer in a cousin with breast cancer, brother with prostate in his 32's.   Today she presents by herself. She feels well. Over last few months she has lost 10-12 lbs due to eating less. She states this is because restaurants have been closed for COVID19. Denies fatigue. She has intermittent mild epigastric and LLQ pain. She vomited once two weeks ago, otherwise denies nausea, bloating, or change in bowel habits. Denies recent fever, chills, cough, chest pain, dyspnea, leg edema.   MEDICAL HISTORY:  Past Medical History:  Diagnosis Date   Anemia    Arthritis    Breast cancer ([place]) 1992   Diabetes mellitus    type 2   Duodenitis 01/18/2002   Fainting spell  GERD (gastroesophageal reflux disease)    Heart murmur    Hiatal hernia 08/08/2008,  01/18/2002   History of pneumonia    Hyperlipidemia    Hypertension    Lymphedema 2017   Right arm   UTI (lower urinary tract infection)     SURGICAL HISTORY: Past Surgical History:  Procedure Laterality Date   AXILLARY SURGERY     cyst removal, right   LIVER BIOPSY  08/2018   Dr. Lindwood Coke   MASTECTOMY  1992   right, with flap   NM [place]  06/11/2009   Protocol:[place], post stress EF58%, EKG negative for ischemia, low risk   TONSILLECTOMY     TRANSTHORACIC ECHOCARDIOGRAM  12/24/2009   LVEF =>55%, normal study    SOCIAL HISTORY: Social History   Socioeconomic History   Marital status: Single    Spouse name: Not on file   Number of children: 2   Years of education: Not on file   Highest education level: Not on file  Occupational History   Occupation: retired  Scientist, product/process development strain: Not hard at [employer] insecurity    Worry: Never true    Inability: Never true   Transportation needs    Medical: No    Non-medical: No  Tobacco Use   Smoking status: Former Smoker    Packs/day: 0.01    Years: 10.00    Pack years: 0.10   Smokeless tobacco: Never Used   Tobacco comment: quit 78 yo;   Substance and Sexual Activity   Alcohol use: Not Currently    Alcohol/week: 1.0 standard drinks    Types: 1 Glasses of wine per week    Comment: occasional, no h/o heavy alcohol use    Drug use: No   Sexual activity: Yes    Partners: Male    Birth control/protection: Condom    Comment: First sexual encounter age 69. Fewer than 5 partners in life time.  Lifestyle   Physical activity    Days per week: 0 days    Minutes per session: 0 min   Stress: Not at all  Relationships   Social connections    Talks on phone: Never    Gets together: Not on file    Attends religious service: More than 4 times per year    Active member of club or organization: No    Attends meetings of clubs or organizations: Never    Relationship  status: Not on file   Intimate partner violence    Fear of current or ex partner: Not on file    Emotionally abused: Not on file    Physically abused: Not on file    Forced sexual activity: Not on file  Other Topics Concern   Not on file  Social History Narrative   05/16/2018:      Lives alone on one level home   Retired Special educational needs teacher   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    FAMILY HISTORY: Family History  Problem Relation Age of Onset   Breast cancer Cousin    Diabetes Brother    Heart disease Mother    Hypertension Mother    Heart disease Father    Hypertension Father    Prostate cancer Brother    Heart attack Maternal Grandmother    Stroke Maternal Grandfather    Colon cancer Neg  Hx     ALLERGIES:  has No Known Allergies.  MEDICATIONS:  Current Outpatient Medications  Medication Sig Dispense Refill   amLODipine (NORVASC) 10 MG tablet TAKE 1 TABLET BY MOUTH EVERY DAY 90 tablet 2   aspirin 81 MG tablet Take 81 mg by mouth daily.      CALCIUM-MAGNESIUM-VITAMIN D PO Take 1 tablet by mouth daily.     Cholecalciferol (VITAMIN D3) 2000 UNITS TABS Take 1 tablet by mouth daily as needed.      Echinacea 400 MG CAPS Take 1 capsule by mouth daily.     glucose blood (ACCU-CHEK AVIVA) test strip Use as instructed 100 each 4   hydrochlorothiazide (HYDRODIURIL) 25 MG tablet Take 1 tablet (25 mg total) by mouth daily. 90 tablet 1   ketoconazole (NIZORAL) 2 % cream Apply 1 fingertip amount to each foot daily. (Patient taking differently: as needed. Apply 1 fingertip amount to each foot daily.) 30 g 0   lansoprazole (PREVACID) 15 MG capsule Take 1 capsule (15 mg total) by mouth daily before breakfast. 90 capsule 0   metFORMIN (GLUCOPHAGE-XR) 500 MG 24 hr tablet Take 1 tablet (500 mg total) by mouth 2 (two) times daily before a meal. 180 tablet 1    metoprolol succinate (TOPROL-XL) 100 MG 24 hr tablet TAKE 1 TABLET (100 MG TOTAL) BY MOUTH DAILY. TAKE WITH OR IMMEDIATELY FOLLOWING A MEAL. 90 tablet 1   Multiple Vitamins-Minerals (CENTRUM SILVER ULTRA WOMENS) TABS Take 1 tablet by mouth daily as needed.      Omega 3 1000 MG CAPS Take 2 capsules by mouth daily.      promethazine-dextromethorphan (PROMETHAZINE-DM) 6.25-15 MG/5ML syrup Take 5 mLs by mouth 3 (three) times daily as needed for cough. 100 mL 0   raloxifene (EVISTA) 60 MG tablet TAKE 1 TABLET DAILY 90 tablet 0   rosuvastatin (CRESTOR) 20 MG tablet Take 1 tablet (20 mg total) by mouth daily. 90 tablet 0   sitaGLIPtin (JANUVIA) 100 MG tablet Take 1 tablet (100 mg total) by mouth daily. 90 tablet 4   triamcinolone cream (KENALOG) 0.1 % Apply 1 application topically 2 (two) times daily. 30 g 0   valsartan (DIOVAN) 320 MG tablet Take 1 tablet (320 mg total) by mouth daily. 90 tablet 1   Current Facility-Administered Medications  Medication Dose Route Frequency Provider Last Rate Last Dose   triamcinolone acetonide (KENALOG) 10 MG/ML injection 10 mg  10 mg Other Once Harriet Masson, DPM        REVIEW OF SYSTEMS:   Constitutional: Denies fevers, chills or abnormal night sweats (+) 10-12 lbs weight loss  Eyes: Denies blurriness of vision, double vision or watery eyes Ears, nose, mouth, throat, and face: Denies mucositis or sore throat Respiratory: Denies cough, dyspnea or wheezes Cardiovascular: Denies palpitation, chest discomfort or lower extremity swelling Gastrointestinal:  Denies nausea, or change in bowel habits (+) vomiting x1 (+) epigastric pain (+) LLQ pain (+) GERD Skin: Denies abnormal skin rashes Lymphatics: Denies new lymphadenopathy or easy bruising Neurological:Denies numbness, tingling or new weaknesses Behavioral/Psych: Mood is stable, no new changes  All other systems were reviewed with the patient and are negative.  PHYSICAL EXAMINATION: ECOG PERFORMANCE  STATUS: 1 - Symptomatic but completely ambulatory  Vitals:   09/21/18 1347  BP: (!) 148/59  Pulse: 64  Resp: 17  Temp: 98.6 F (37 C)  SpO2: 100%   Filed Weights   09/21/18 1347  Weight: 152 lb 9.6 oz (69.2 kg)    GENERAL:alert,  no distress and comfortable SKIN: no obvious rash  EYES:  sclera clear LUNGS: respirations even and unlabored  HEART:  no lower extremity edema ABDOMEN:abdomen soft, non-tender. No hepatomegaly  Musculoskeletal:no cyanosis of digits  PSYCH: alert & oriented x 3 with fluent speech NEURO: normal gait   LABORATORY DATA:  I have reviewed the data as listed CBC Latest Ref Rng & Units 09/13/2018 04/26/2017 12/17/2015  WBC 4.0 - 10.5 K/uL 6.9 5.7 9.0  Hemoglobin 12.0 - 15.0 g/dL 13.0 12.9 12.3  Hematocrit 36.0 - 46.0 % 42.0 39.2 38.9  Platelets 150 - 400 K/uL 216 235 234    CMP Latest Ref Rng & Units 09/13/2018 09/01/2018 10/07/2017  Glucose 70 - 99 mg/dL 83 83 89  BUN 8 - 23 mg/dL 31(H) 21 18  Creatinine 0.44 - 1.00 mg/dL 1.78(H) 1.40(H) 1.21(H)  Sodium 135 - 145 mmol/L 141 141 144  Potassium 3.5 - 5.1 mmol/L 5.4(H) 3.3(L) 4.2  Chloride 98 - 111 mmol/L 106 103 105  CO2 22 - 32 mmol/L 26 31 26   Calcium 8.9 - 10.3 mg/dL 9.5 9.4 9.5  Total Protein 6.5 - 8.1 g/dL 7.8 6.6 6.6  Total Bilirubin 0.3 - 1.2 mg/dL 0.7 0.3 0.3  Alkaline Phos 38 - 126 U/L 52 51 56  AST 15 - 41 U/L 25 14 15   ALT 0 - 44 U/L 13 11 12     RADIOGRAPHIC STUDIES: I have personally reviewed the radiological images as listed and agreed with the findings in the report. Ct Chest W Contrast  Result Date: 09/07/2018 CLINICAL DATA:  Follow-up breast cancer. EXAM: CT CHEST WITH CONTRAST TECHNIQUE: Multidetector CT imaging of the chest was performed during intravenous contrast administration. CONTRAST:  11mL OMNIPAQUE IOHEXOL 300 MG/ML  SOLN COMPARISON:  12/02/15 FINDINGS: Cardiovascular: Heart size is normal. No pericardial effusion. Aortic atherosclerosis. Lad coronary artery calcifications.  Mediastinum/Nodes: Normal appearance of the thyroid gland. The trachea appears patent and is midline. Normal appearance of the esophagus. No mediastinal, hilar, axillary, or supraclavicular adenopathy. Lungs/Pleura: No pleural effusion.  Lungs are clear. Upper Abdomen: Partially visualized enhancing mass within segment 4 of the liver measures 6.2 x 3.8 by 4.8 cm. Mass involves gallbladder fundus. New from 12/02/2015. Scattered liver cysts are again noted. Musculoskeletal: No chest wall abnormality. No acute or significant osseous findings. IMPRESSION: 1. New, enhancing mass involving segment 4 of the liver and fundus of gallbladder is concerning for malignancy. This may represent either metastatic disease from breast cancer or neoplasm primary to the liver or hepatic biliary tree. Further evaluation with contrast enhanced CT of the abdomen and pelvis is recommended. 2. No findings to suggest metastatic disease within the chest. 3.  Aortic Atherosclerosis (ICD10-I70.0). 4. Coronary artery calcifications. Electronically Signed   By: Kerby Moors M.D.   On: 09/07/2018 15:32   US Biopsy (liver)  Result Date: 09/13/2018 INDICATION: History of breast cancer, now with infiltrative mass adjacent to the gallbladder fossa. Please from ultrasound-guided liver lesion biopsy for tissue diagnostic purposes. EXAM: ULTRASOUND GUIDED LIVER LESION BIOPSY COMPARISON:  Chest CT-09/07/2018 MEDICATIONS: None ANESTHESIA/SEDATION: Fentanyl 100 mcg IV; Versed 3 mg IV Total Moderate Sedation time:  10 Minutes. The patient's level of consciousness and vital signs were monitored continuously by radiology nursing throughout the procedure under my direct supervision. COMPLICATIONS: None immediate. PROCEDURE: Informed written consent was obtained from the patient after a discussion of the risks, benefits and alternatives to treatment. The patient understands and consents the procedure. A timeout was performed prior to the initiation  of the  procedure. Ultrasound scanning was performed of the right upper abdominal quadrant demonstrates a large infiltrative mass adjacent to the gallbladder fossa compatible the findings seen on preceding CT scan the abdomen pelvis. The procedure was planned. The right upper abdominal quadrant was prepped and draped in the usual sterile fashion. The overlying soft tissues were anesthetized with 1% lidocaine with epinephrine. A 17 gauge, 6.8 cm co-axial needle was advanced into a peripheral aspect of the lesion. This was followed by 4 core biopsies with an 18 gauge core device under direct ultrasound guidance. The coaxial needle tract was embolized with a small amount of Gel-Foam slurry and superficial hemostasis was obtained with manual compression. Post procedural scanning was negative for definitive area of hemorrhage or additional complication. A dressing was placed. The patient tolerated the procedure well without immediate post procedural complication. IMPRESSION: Technically successful ultrasound guided core needle biopsy of infiltrative mass adjacent to the gallbladder fossa. Electronically Signed   By: Sandi Mariscal M.D.   On: 09/13/2018 15:16    ASSESSMENT & PLAN: 78 year old female with h/o right breast cancer, HL, HTN, DM, and renal artery stenosis found to have liver mass  1. Liver mass, favored to be cholangiocarcinoma  -We reviewed her medical record in detail with the patient. She presented with mild weight loss, no significant abdominal pain. Biopsy of her liver mass shows adenocarcinoma, positive for CK7; negative for CK20, TTF1, CDX-2, GATA, PAX 8, qualitative ER, p63, and CK5/6. This is favored to be cholangiocarcinoma -Dr. Burr Medico recommends to do PET scan and EGD to r/o upper GI primary. If PET scan is not approved, will do CT AP w contrast. Will refer her back to Dr. Havery Moros -if this is cholangiocarcinoma, more likely she would require surgery upfront. We reviewed the possibility of neoadjuvant  chemotherapy. She is elderly but with good baseline performance status and controlled comorbidities.  -Will refer to Dr. Barry Dienes for surgery consult  -plan to discuss her case in GI tumor board next week  -will do lab including tumor markers this week -plan to f/u in 2 weeks after work up; if she goes straight to surgery we can see her after she is discharged   2. H/o right breast cancer  -s/p mastectomy in 1992, per patient did not require adjuvant therapy  -followed by Dr. Jana Hakim   3. DM, HTN, hiatal hernia, GERD, renal artery stenosis  -followed by GI Dr. Havery Moros; last colonoscopy in 2010 was normal, previous EGD showed 4-5 cm hiatal hernia -followed by Dr. Sallyanne Kuster for routine cardiology f/u  4. Family support  -she has a son and daughter, she has not told her children many details of her diagnosis yet. She plans to discuss with them once she has more answers.  -she has spoken with [name], RN navigator on the phone   PLAN: -Lab this week  -PET and EGD in next 1-2 weeks  -Refer to Dr. Barry Dienes  -Will refer back to Dr. Havery Moros for EGD  -Discuss in GI tumor board  -F/u in 2 weeks, or see her back after surgery    Orders Placed This Encounter  Procedures   NM PET Image Initial (PI) Skull Base To Thigh    Standing Status:   Future    Standing Expiration Date:   09/21/2019    Order Specific Question:   ** REASON FOR EXAM (FREE TEXT)    Answer:   liver mass, favored to be cholangiocarcinoma, r/o other upper GI or pancreatobiliary  cancer    Order Specific Question:   If indicated for the ordered procedure, I authorize the administration of a radiopharmaceutical per Radiology protocol    Answer:   Yes    Order Specific Question:   Preferred imaging location?    Answer:   [facility]    Order Specific Question:   Radiology Contrast Protocol - do NOT remove file path    Answer:   [email or web address]   [place] 19.9    Standing Status:   Future     Standing Expiration Date:   09/21/2019   Ambulatory referral to General Surgery    Referral Priority:   Routine    Referral Type:   Surgical    Referral Reason:   Specialty Services Required    Referred to Provider:   [name], MD    Requested Specialty:   General Surgery    Number of Visits Requested:   1    All questions were answered. The patient knows to call the clinic with any problems, questions or concerns.     Alla Feeling, NP 09/21/2018 4:33 PM  Addendum  I have seen the patient, examined her. I agree with the assessment and and plan and have edited the notes.   Donna May is a 78 yo female with remote history of right breast cancer (DCIS) s/p mastectomy, who was found to have a large liver mass and biopsy confirmed adenocarcinoma, CK7(+).  I spoke with pathologist, the biopsy morphology favor cholangiocarcinoma or pancreatic cancer, upper GI malignancy is also possible.  I recommend PET scan for staging and rule out other primary. If insurance denies PET, will get CT abdomen and pelvis with contrast (pancreastic protocol), I will also referred her back to GI Dr. Havery Moros to get EGD. I will check lab including tumor markers. If the above work up is negative, then she will see Dr. Barry Dienes to discuss surgical resection. Although she is 89, she is relatively healthy and fit, and she is interested in surgery.  We briefly discussed the role of adjuvant chemotherapy followed in resected cholangiocarcinoma. Due to her CKD, especially recently worsening renal function, Xeloda may not be feasible for her. Plan to see her after surgery, or sooner if needed.   [name]  09/21/2018

## 2018-09-21 ENCOUNTER — Encounter: Payer: Self-pay | Admitting: Nurse Practitioner

## 2018-09-21 ENCOUNTER — Inpatient Hospital Stay: Payer: Medicare Other | Attending: Adult Health | Admitting: Nurse Practitioner

## 2018-09-21 ENCOUNTER — Other Ambulatory Visit: Payer: Self-pay

## 2018-09-21 ENCOUNTER — Telehealth: Payer: Self-pay

## 2018-09-21 DIAGNOSIS — C787 Secondary malignant neoplasm of liver and intrahepatic bile duct: Secondary | ICD-10-CM | POA: Insufficient documentation

## 2018-09-21 DIAGNOSIS — Z9011 Acquired absence of right breast and nipple: Secondary | ICD-10-CM | POA: Insufficient documentation

## 2018-09-21 DIAGNOSIS — Z87891 Personal history of nicotine dependence: Secondary | ICD-10-CM | POA: Diagnosis not present

## 2018-09-21 DIAGNOSIS — Z86000 Personal history of in-situ neoplasm of breast: Secondary | ICD-10-CM

## 2018-09-21 DIAGNOSIS — E1122 Type 2 diabetes mellitus with diabetic chronic kidney disease: Secondary | ICD-10-CM | POA: Insufficient documentation

## 2018-09-21 DIAGNOSIS — Z79899 Other long term (current) drug therapy: Secondary | ICD-10-CM | POA: Insufficient documentation

## 2018-09-21 DIAGNOSIS — C221 Intrahepatic bile duct carcinoma: Secondary | ICD-10-CM

## 2018-09-21 DIAGNOSIS — C801 Malignant (primary) neoplasm, unspecified: Secondary | ICD-10-CM

## 2018-09-21 DIAGNOSIS — I129 Hypertensive chronic kidney disease with stage 1 through stage 4 chronic kidney disease, or unspecified chronic kidney disease: Secondary | ICD-10-CM | POA: Insufficient documentation

## 2018-09-21 DIAGNOSIS — Z853 Personal history of malignant neoplasm of breast: Secondary | ICD-10-CM | POA: Insufficient documentation

## 2018-09-21 DIAGNOSIS — N189 Chronic kidney disease, unspecified: Secondary | ICD-10-CM | POA: Diagnosis not present

## 2018-09-21 NOTE — Telephone Encounter (Signed)
Called Mrs. Strada to review today's  1:45 apt with Cira Rue NP/Dr. Burr Medico. Patient is familiar with [place] from previous breast cancer care and understands current visitation policy. She is also aware of [place] support services. I will touch base with patient after her appointment today. I explained role of nurse navigator and provided my direct contact information. Patient has supportive family. No barriers to care noted at this time. Will provide educational materials via mail.

## 2018-09-22 ENCOUNTER — Telehealth: Payer: Self-pay | Admitting: Nurse Practitioner

## 2018-09-22 ENCOUNTER — Inpatient Hospital Stay: Payer: Medicare Other

## 2018-09-22 ENCOUNTER — Other Ambulatory Visit: Payer: Self-pay

## 2018-09-22 ENCOUNTER — Telehealth: Payer: Self-pay | Admitting: Gastroenterology

## 2018-09-22 ENCOUNTER — Other Ambulatory Visit: Payer: Self-pay | Admitting: Oncology

## 2018-09-22 DIAGNOSIS — Z87891 Personal history of nicotine dependence: Secondary | ICD-10-CM | POA: Diagnosis not present

## 2018-09-22 DIAGNOSIS — C50911 Malignant neoplasm of unspecified site of right female breast: Secondary | ICD-10-CM

## 2018-09-22 DIAGNOSIS — N189 Chronic kidney disease, unspecified: Secondary | ICD-10-CM | POA: Diagnosis not present

## 2018-09-22 DIAGNOSIS — E1122 Type 2 diabetes mellitus with diabetic chronic kidney disease: Secondary | ICD-10-CM | POA: Diagnosis not present

## 2018-09-22 DIAGNOSIS — Z171 Estrogen receptor negative status [ER-]: Secondary | ICD-10-CM

## 2018-09-22 DIAGNOSIS — R1901 Right upper quadrant abdominal swelling, mass and lump: Secondary | ICD-10-CM

## 2018-09-22 DIAGNOSIS — Z79899 Other long term (current) drug therapy: Secondary | ICD-10-CM | POA: Diagnosis not present

## 2018-09-22 DIAGNOSIS — I129 Hypertensive chronic kidney disease with stage 1 through stage 4 chronic kidney disease, or unspecified chronic kidney disease: Secondary | ICD-10-CM | POA: Diagnosis not present

## 2018-09-22 DIAGNOSIS — C787 Secondary malignant neoplasm of liver and intrahepatic bile duct: Secondary | ICD-10-CM | POA: Diagnosis not present

## 2018-09-22 DIAGNOSIS — C221 Intrahepatic bile duct carcinoma: Secondary | ICD-10-CM

## 2018-09-22 LAB — CMP (CANCER CENTER ONLY)
ALT: 9 U/L (ref 0–44)
AST: 15 U/L (ref 15–41)
Albumin: 3.8 g/dL (ref 3.5–5.0)
Alkaline Phosphatase: 52 U/L (ref 38–126)
Anion gap: 11 (ref 5–15)
BUN: 25 mg/dL — ABNORMAL HIGH (ref 8–23)
CO2: 25 mmol/L (ref 22–32)
Calcium: 9.5 mg/dL (ref 8.9–10.3)
Chloride: 104 mmol/L (ref 98–111)
Creatinine: 1.46 mg/dL — ABNORMAL HIGH (ref 0.44–1.00)
GFR, Est AFR Am: 39 mL/min — ABNORMAL LOW (ref >60)
GFR, Estimated: 34 mL/min — ABNORMAL LOW (ref >60)
Glucose, Bld: 127 mg/dL — ABNORMAL HIGH (ref 70–99)
Potassium: 4 mmol/L (ref 3.5–5.1)
Sodium: 140 mmol/L (ref 135–145)
Total Bilirubin: 0.4 mg/dL (ref 0.3–1.2)
Total Protein: 7.2 g/dL (ref 6.5–8.1)

## 2018-09-22 LAB — CBC WITH DIFFERENTIAL (CANCER CENTER ONLY)
Abs Immature Granulocytes: 0.01 10*3/uL (ref 0.00–0.07)
Basophils Absolute: 0 10*3/uL (ref 0.0–0.1)
Basophils Relative: 1 %
Eosinophils Absolute: 0.1 10*3/uL (ref 0.0–0.5)
Eosinophils Relative: 2 %
HCT: 39.4 % (ref 36.0–46.0)
Hemoglobin: 12.7 g/dL (ref 12.0–15.0)
Immature Granulocytes: 0 %
Lymphocytes Relative: 36 %
Lymphs Abs: 1.9 10*3/uL (ref 0.7–4.0)
MCH: 28.6 pg (ref 26.0–34.0)
MCHC: 32.2 g/dL (ref 30.0–36.0)
MCV: 88.7 fL (ref 80.0–100.0)
Monocytes Absolute: 0.4 10*3/uL (ref 0.1–1.0)
Monocytes Relative: 8 %
Neutro Abs: 2.8 10*3/uL (ref 1.7–7.7)
Neutrophils Relative %: 53 %
Platelet Count: 231 10*3/uL (ref 150–400)
RBC: 4.44 MIL/uL (ref 3.87–5.11)
RDW: 14.5 % (ref 11.5–15.5)
WBC Count: 5.3 10*3/uL (ref 4.0–10.5)
nRBC: 0 % (ref 0.0–0.2)

## 2018-09-22 LAB — CEA (IN HOUSE-CHCC): CEA (CHCC-In House): 2.49 ng/mL (ref 0.00–5.00)

## 2018-09-22 MED ORDER — NA SULFATE-K SULFATE-MG SULF 17.5-3.13-1.6 GM/177ML PO SOLN: 1.0000 | Freq: Once | ORAL | 0 refills | Status: AC

## 2018-09-22 NOTE — Telephone Encounter (Signed)
Patient scheduled for EGD/Colon in [place] 09/23/18 at 2:00pm (per Dr. Havery Moros) Su-Prep sent to patient's pharmacy. Instructions reviewed with patient on the phone and letter printed for patient to pick-up (no My Chart) Patient knows to hold am DM meds PO.

## 2018-09-22 NOTE — Telephone Encounter (Signed)
Scheduled appt per 7/8 los. Spoke with patient and she is aware of her appt date and time. °

## 2018-09-22 NOTE — Telephone Encounter (Signed)
Patient returning your call.

## 2018-09-22 NOTE — Telephone Encounter (Signed)
Patient called in to rescheduled her lab appt from Friday to today, because she had just received a phone about another appt else where.  Patient aware of her new appt time and date.

## 2018-09-22 NOTE — Telephone Encounter (Signed)
Spoke with patient regarding Covid-19 screening questions. °Covid-19 Screening Questions: ° °Do you now or have you had a fever in the last 14 days? no ° °Do you have any respiratory symptoms of shortness of breath or  °cough now or in the last 14 days? no ° °Do you have any family members or close contacts with diagnosed or suspected Covid-19 in the past 14 days? no ° °Have you been tested for Covid-19 and found to be positive?  °Yes, Negative ° °Pt made aware of that care partner may wait in the car or come up to the lobby during the procedure but will need to provide their own mask. °

## 2018-09-23 ENCOUNTER — Other Ambulatory Visit: Payer: Medicare Other

## 2018-09-23 ENCOUNTER — Ambulatory Visit (AMBULATORY_SURGERY_CENTER): Payer: Medicare Other | Admitting: Gastroenterology

## 2018-09-23 ENCOUNTER — Encounter: Payer: Self-pay | Admitting: Gastroenterology

## 2018-09-23 VITALS — BP 131/72 | HR 65 | Temp 99.0°F | Resp 12 | Ht 65.0 in | Wt 154.0 lb

## 2018-09-23 DIAGNOSIS — D123 Benign neoplasm of transverse colon: Secondary | ICD-10-CM

## 2018-09-23 DIAGNOSIS — K449 Diaphragmatic hernia without obstruction or gangrene: Secondary | ICD-10-CM

## 2018-09-23 DIAGNOSIS — K635 Polyp of colon: Secondary | ICD-10-CM | POA: Diagnosis not present

## 2018-09-23 DIAGNOSIS — Z1211 Encounter for screening for malignant neoplasm of colon: Secondary | ICD-10-CM | POA: Diagnosis not present

## 2018-09-23 DIAGNOSIS — D122 Benign neoplasm of ascending colon: Secondary | ICD-10-CM

## 2018-09-23 DIAGNOSIS — K219 Gastro-esophageal reflux disease without esophagitis: Secondary | ICD-10-CM | POA: Diagnosis not present

## 2018-09-23 DIAGNOSIS — D125 Benign neoplasm of sigmoid colon: Secondary | ICD-10-CM | POA: Diagnosis not present

## 2018-09-23 DIAGNOSIS — K317 Polyp of stomach and duodenum: Secondary | ICD-10-CM

## 2018-09-23 DIAGNOSIS — R160 Hepatomegaly, not elsewhere classified: Secondary | ICD-10-CM

## 2018-09-23 DIAGNOSIS — K2970 Gastritis, unspecified, without bleeding: Secondary | ICD-10-CM | POA: Diagnosis not present

## 2018-09-23 HISTORY — PX: COLONOSCOPY: SHX174

## 2018-09-23 LAB — CANCER ANTIGEN 19-9: CA 19-9: 1 U/mL (ref 0–35)

## 2018-09-23 LAB — CANCER ANTIGEN 27.29: CA 27.29: 18.3 U/mL (ref 0.0–38.6)

## 2018-09-23 MED ORDER — SODIUM CHLORIDE 0.9 % IV SOLN: 500.0000 mL | Freq: Once | INTRAVENOUS | Status: DC

## 2018-09-23 NOTE — Progress Notes (Signed)
A and O x3. Report to RN. Tolerated MAC anesthesia well.Teeth unchanged after procedure.

## 2018-09-23 NOTE — Progress Notes (Signed)
Upon arriving to admission pt. Informed staff that she drunk water @ 12:20 pm,made doctor and crna aware pt. Procedure postpone until @ 2:20 pm,pt. And family member made aware.

## 2018-09-23 NOTE — Progress Notes (Signed)
Called to room to assist during endoscopic procedure.  Patient ID and intended procedure confirmed with present staff. Received instructions for my participation in the procedure from the performing physician.  

## 2018-09-23 NOTE — Op Note (Signed)
[facility] Patient Name: Donna May Procedure Date: 09/23/2018 2:13 PM MRN: [id number] Endoscopist: Remo Lipps P. Havery Moros , MD Age: 78 Referring MD:  Date of Birth: Jan 15, 1941 Gender: Female Account #: [id number] Procedure:                Upper GI endoscopy Indications:              CT with liver mass, biopsies show adenocarcinoma,                            suspected cholangio but cannot rule out upper tract                            malignancy. EGD to further evaluate Medicines:                Monitored Anesthesia Care Procedure:                Pre-Anesthesia Assessment:                           - Prior to the procedure, a History and Physical                            was performed, and patient medications and                            allergies were reviewed. The patient's tolerance of                            previous anesthesia was also reviewed. The risks                            and benefits of the procedure and the sedation                            options and risks were discussed with the patient.                            All questions were answered, and informed consent                            was obtained. Prior Anticoagulants: The patient has                            taken no previous anticoagulant or antiplatelet                            agents. ASA Grade Assessment: III - A patient with                            severe systemic disease. After reviewing the risks                            and benefits, the patient was deemed in  satisfactory condition to undergo the procedure.                           After obtaining informed consent, the endoscope was                            passed under direct vision. Throughout the                            procedure, the patient's blood pressure, pulse, and                            oxygen saturations were monitored continuously. The                            Model  GIF-[id number]) scope was introduced                            through the mouth, and advanced to the second part                            of duodenum. The upper GI endoscopy was                            accomplished without difficulty. The patient                            tolerated the procedure well. Scope In: Scope Out: Findings:                 Esophagogastric landmarks were identified: the                            Z-line was found at 36 cm, the gastroesophageal                            junction was found at 36 cm and the upper extent of                            the gastric folds was found at 38 cm from the                            incisors.                           A 2 cm hiatal hernia was present.                           The exam of the esophagus was otherwise normal.                           A single 4 mm sessile polyp was found in the  gastric body. The polyp was removed with a cold                            biopsy forceps. Resection and retrieval were                            complete.                           Patchy mild inflammation characterized by erythema                            and friability was found in the gastric body and in                            the gastric antrum. Biopsies were taken with a cold                            forceps for Helicobacter pylori testing.                           The exam of the stomach was otherwise normal.                           The duodenal bulb and second portion of the                            duodenum were normal. Complications:            No immediate complications. Estimated blood loss:                            Minimal. Estimated Blood Loss:     Estimated blood loss was minimal. Impression:               - Esophagogastric landmarks identified.                           - 2 cm hiatal hernia.                           - Normal esophagus otherwise.                            - A single gastric polyp. Resected and retrieved.                           - Mild gastritis. Biopsied.                           - Normal duodenal bulb and second portion of the                            duodenum.                           Overall, no mass  lesion or malignancy in the upper                            tract to correlate to liver lesion. Recommendation:           - Patient has a contact number available for                            emergencies. The signs and symptoms of potential                            delayed complications were discussed with the                            patient. Return to normal activities tomorrow.                            Written discharge instructions were provided to the                            patient.                           - Resume previous diet.                           - Continue present medications.                           - Await pathology results.                           - Follow up with Oncology for further management of                            liver mass Remo Lipps P. Santita Hunsberger, MD 09/23/2018 3:09:57 PM This report has been signed electronically.

## 2018-09-23 NOTE — Patient Instructions (Signed)
Please read handouts provided. Await pathology results. Continue present medications.      YOU HAD AN ENDOSCOPIC PROCEDURE TODAY AT [facility]:   Refer to the procedure report that was given to you for any specific questions about what was found during the examination.  If the procedure report does not answer your questions, please call your gastroenterologist to clarify.  If you requested that your care partner not be given the details of your procedure findings, then the procedure report has been included in a sealed envelope for you to review at your convenience later.  YOU SHOULD EXPECT: Some feelings of bloating in the abdomen. Passage of more gas than usual.  Walking can help get rid of the air that was put into your GI tract during the procedure and reduce the bloating. If you had a lower endoscopy (such as a colonoscopy or flexible sigmoidoscopy) you may notice spotting of blood in your stool or on the toilet paper. If you underwent a bowel prep for your procedure, you may not have a normal bowel movement for a few days.  Please Note:  You might notice some irritation and congestion in your nose or some drainage.  This is from the oxygen used during your procedure.  There is no need for concern and it should clear up in a day or so.  SYMPTOMS TO REPORT IMMEDIATELY:   Following lower endoscopy (colonoscopy or flexible sigmoidoscopy):  Excessive amounts of blood in the stool  Significant tenderness or worsening of abdominal pains  Swelling of the abdomen that is new, acute  Fever of 100F or higher   Following upper endoscopy (EGD)  Vomiting of blood or coffee ground material  New chest pain or pain under the shoulder blades  Painful or persistently difficult swallowing  New shortness of breath  Fever of 100F or higher  Black, tarry-looking stools  For urgent or emergent issues, a gastroenterologist can be reached at any hour by calling [phone]  [phone].   DIET:  We do recommend a small meal at first, but then you may proceed to your regular diet.  Drink plenty of fluids but you should avoid alcoholic beverages for 24 hours.  ACTIVITY:  You should plan to take it easy for the rest of today and you should NOT DRIVE or use heavy machinery until tomorrow (because of the sedation medicines used during the test).    FOLLOW UP: Our staff will call the number listed on your records 48-72 hours following your procedure to check on you and address any questions or concerns that you may have regarding the information given to you following your procedure. If we do not reach you, we will leave a message.  We will attempt to reach you two times.  During this call, we will ask if you have developed any symptoms of COVID 19. If you develop any symptoms (ie: fever, flu-like symptoms, shortness of breath, cough etc.) before then, please call [phone].  If you test positive for Covid 19 in the 2 weeks post procedure, please call and report this information to [place].    If any biopsies were taken you will be contacted by phone or by letter within the next 1-3 weeks.  Please call us at [phone] if you have not heard about the biopsies in 3 weeks.    SIGNATURES/CONFIDENTIALITY: You and/or your care partner have signed paperwork which will be entered into your electronic medical record.  These signatures attest to the fact  that that the information above on your After Visit Summary has been reviewed and is understood.  Full responsibility of the confidentiality of this discharge information lies with you and/or your care-partner.

## 2018-09-23 NOTE — Progress Notes (Signed)
Temperature taken by Riki Sheer, LPN, VS taken by [facility], [place]

## 2018-09-23 NOTE — Progress Notes (Signed)
Temperature taken by Riki Sheer, LPN, VS taken by [facility], [place]

## 2018-09-23 NOTE — Op Note (Signed)
[facility] Patient Name: Donna May Procedure Date: 09/23/2018 2:13 PM MRN: [id number] Endoscopist: Remo Lipps P. Havery Moros , MD Age: 78 Referring MD:  Date of Birth: [birth year] Gender: Female Account #: [id number] Procedure:                Colonoscopy Indications:              Screening for colorectal malignant neoplasm Medicines:                Monitored Anesthesia Care Procedure:                Pre-Anesthesia Assessment:                           - Prior to the procedure, a History and Physical                            was performed, and patient medications and                            allergies were reviewed. The patient's tolerance of                            previous anesthesia was also reviewed. The risks                            and benefits of the procedure and the sedation                            options and risks were discussed with the patient.                            All questions were answered, and informed consent                            was obtained. Prior Anticoagulants: The patient has                            taken no previous anticoagulant or antiplatelet                            agents. ASA Grade Assessment: III - A patient with                            severe systemic disease. After reviewing the risks                            and benefits, the patient was deemed in                            satisfactory condition to undergo the procedure.                           After obtaining informed consent, the colonoscope  was passed under direct vision. Throughout the                            procedure, the patient's blood pressure, pulse, and                            oxygen saturations were monitored continuously. The                            Colonoscope was introduced through the anus and                            advanced to the the cecum, identified by                            appendiceal orifice  and ileocecal valve. The                            colonoscopy was performed without difficulty. The                            patient tolerated the procedure well. The quality                            of the bowel preparation was adequate. The                            ileocecal valve, appendiceal orifice, and rectum                            were photographed. Scope In: 2:36:07 PM Scope Out: 3:01:09 PM Scope Withdrawal Time: 0 hours 19 minutes 16 seconds  Total Procedure Duration: 0 hours 25 minutes 2 seconds  Findings:                 The perianal and digital rectal examinations were                            normal.                           Two sessile polyps were found in the ascending                            colon. The                           polyps were 3 to 4 mm in size. These polyps were                            removed with a cold snare. Resection and retrieval                            were complete.  Five sessile polyps were found in the transverse                            colon. The polyps were 3 to 5 mm in size. These                            polyps were removed with a cold snare. Resection                            and retrieval were complete.                           A 5 mm polyp was found in the splenic flexure. The                            polyp was sessile. The polyp was removed with a                            cold snare. Resection and retrieval were complete.                           Three sessile polyps were found in the sigmoid                            colon. The polyps were 3 to 5 mm in size. These                            polyps were removed with a cold snare. Resection                            and retrieval were complete.                           The exam was otherwise without abnormality. Complications:            No immediate complications. Estimated blood loss:                             Minimal. Estimated Blood Loss:     Estimated blood loss was minimal. Impression:               - Two 3 to 4 mm polyps in the ascending colon,                            removed with a cold snare. Resected and retrieved.                           - Five 3 to 5 mm polyps in the transverse colon,                            removed with a cold snare. Resected and retrieved.                           -  One 5 mm polyp at the splenic flexure, removed                            with a cold snare. Resected and retrieved.                           - Three 3 to 5 mm polyps in the sigmoid colon,                            removed with a cold snare. Resected and retrieved.                           - The examination was otherwise normal. Recommendation:           - Patient has a contact number available for                            emergencies. The signs and symptoms of potential                            delayed complications were discussed with the                            patient. Return to normal activities tomorrow.                            Written discharge instructions were provided to the                            patient.                           - Resume previous diet.                           - Continue present medications.                           - Await pathology results. Remo Lipps P. Mandeep Kiser, MD 09/23/2018 3:05:38 PM This report has been signed electronically.

## 2018-09-27 ENCOUNTER — Telehealth: Payer: Self-pay | Admitting: *Deleted

## 2018-09-27 NOTE — Telephone Encounter (Signed)
1. Have you developed a fever since your procedure? no  2.   Have you had an respiratory symptoms (SOB or cough) since your procedure? no  3.   Have you tested positive for COVID 19 since your procedure *no 4.   Have you had any family members/close contacts diagnosed with the COVID 19 since your procedure?  no   If yes to any of these questions please route to Joylene John, RN and Alphonsa Gin, Therapist, sports.  Follow up Call-  Call back number 09/23/2018  Post procedure Call Back phone  # [phone]  Permission to leave phone message Yes  Some recent data might be hidden     Patient questions:  Do you have a fever, pain , or abdominal swelling? No. Pain Score  0 *  Have you tolerated food without any problems? Yes.    Have you been able to return to your normal activities? Yes.    Do you have any questions about your discharge instructions: Diet   No. Medications  No. Follow up visit  No.  Do you have questions or concerns about your Care? No.  Actions: * If pain score is 4 or above: No action needed, pain <4.

## 2018-09-28 ENCOUNTER — Encounter: Payer: Medicare Other | Admitting: Hematology

## 2018-09-29 ENCOUNTER — Ambulatory Visit (HOSPITAL_COMMUNITY)
Admission: RE | Admit: 2018-09-29 | Discharge: 2018-09-29 | Disposition: A | Discharge status: Home / Self Care | Payer: Medicare Other | Source: Ambulatory Visit | Attending: Nurse Practitioner | Admitting: Nurse Practitioner

## 2018-09-29 ENCOUNTER — Encounter: Payer: Self-pay | Admitting: Gastroenterology

## 2018-09-29 ENCOUNTER — Other Ambulatory Visit: Payer: Self-pay

## 2018-09-29 DIAGNOSIS — C221 Intrahepatic bile duct carcinoma: Secondary | ICD-10-CM | POA: Diagnosis not present

## 2018-09-29 DIAGNOSIS — C50919 Malignant neoplasm of unspecified site of unspecified female breast: Secondary | ICD-10-CM | POA: Diagnosis not present

## 2018-09-29 DIAGNOSIS — Z79899 Other long term (current) drug therapy: Secondary | ICD-10-CM | POA: Diagnosis not present

## 2018-09-29 LAB — GLUCOSE, CAPILLARY: Glucose-Capillary: 77 mg/dL (ref 70–99)

## 2018-09-29 IMAGING — PT NUCLEAR MEDICINE PET IMAGE INITIAL (PI) SKULL BASE TO THIGH
1 series · 5 of 5 positions shown · non-contrast
Comparison: Chest CT 09/07/2018

CLINICAL DATA: Subsequent treatment strategy for breast cancer. New
liver lesion.

EXAM:
NUCLEAR MEDICINE PET SKULL BASE TO THIGH
TECHNIQUE: 7.7 mCi F-18 FDG was injected intravenously. Full-ring PET imaging
was performed from the skull base to thigh after the radiotracer. CT
data was obtained and used for attenuation correction and anatomic
localization.
Fasting blood glucose: 77 mg/dl

[Series 1081: results mm oncology reading · 1.0mm · 0.89mm/px · 5 of 5 slices shown]
[im 1/5]
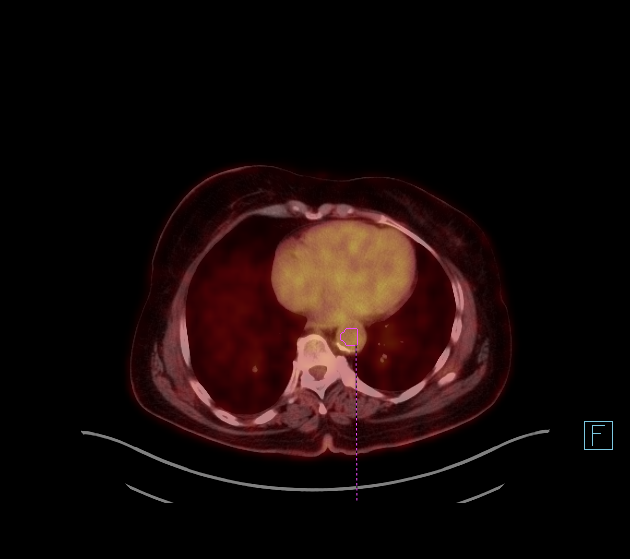
[im 2/5]
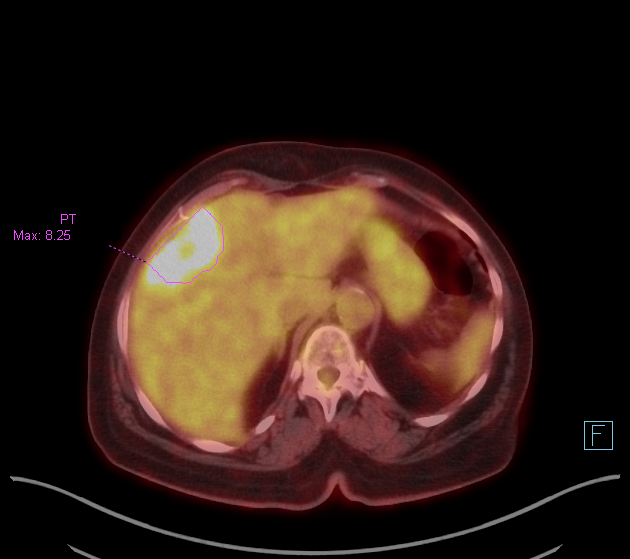
[im 3/5]
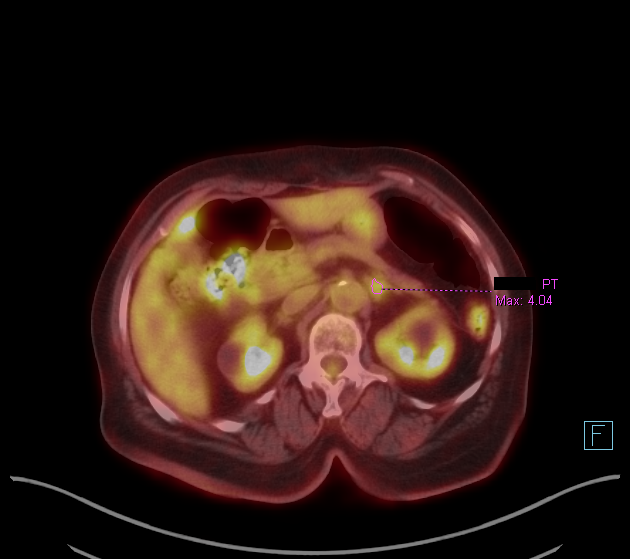
[im 4/5]
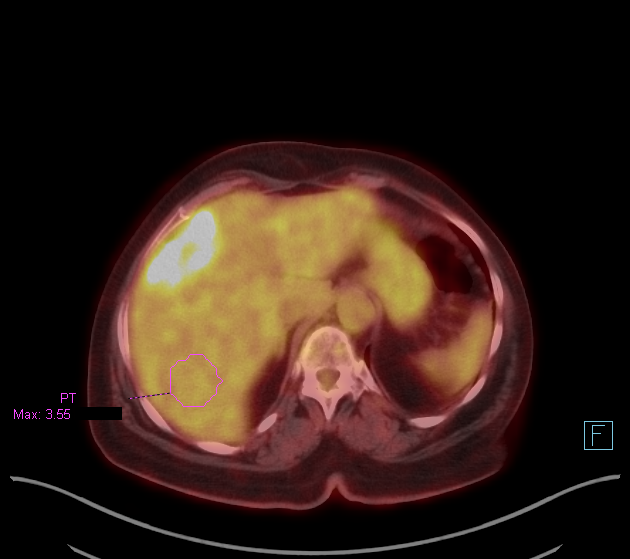
[im 5/5]
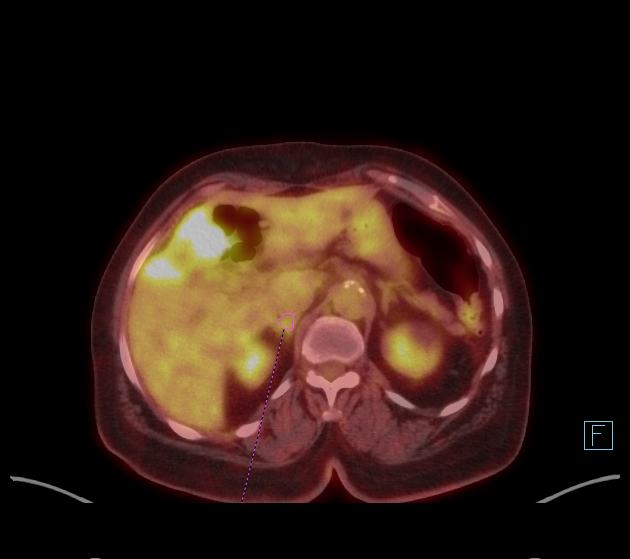

[5 of 5 positions shown; findings below may reference images not displayed]

FINDINGS: Mediastinal blood pool activity: SUV max

Liver activity: SUV max NA

NECK: No hypermetabolic lymph nodes in the neck.

Incidental CT findings: none

CHEST: No hypermetabolic mediastinal or hilar nodes. No suspicious
pulmonary nodules on the CT scan.

Incidental CT findings: RIGHT mastectomy anatomy. RIGHT axillary
nodal dissection. No hypermetabolic axillary lymph nodes.

.

ABDOMEN/PELVIS:

Large lesion in the anterior RIGHT hepatic lobe measures 6.5 x
cm and has intense metabolic activity SUV max equal 8.2.

Several additional hypodense lesions within the liver which do not
accumulate FDG radiotracer consistent benign cysts.

No hypermetabolic periportal nodes.

There is focal activity associated with the LEFT adrenal gland with
SUV max equal 4.0; however, there is no mass lesion. This activity
is slightly above liver is (SUV max equal 3.6). Mild activity
associated with the RIGHT adrenal gland also with SUV max equal 3.8.

No abnormal activity associated with the pancreas or common bile
duct.

No hypermetabolic pelvic lymph nodes.

Intense radiotracer activity metabolic activity associated with the
colon and small bowel is physiologic

Incidental CT findings: Atherosclerotic calcification of the aorta.
Uterus normal. Adnexa normal.

SKELETON:

Single focus of uptake associated with the T10 vertebral body is
favored degenerative and associated with endplate sclerosis. No
evidence skeletal metastasis elsewhere

Incidental CT findings: none
IMPRESSION: 1. Hypermetabolic mass in the RIGHT hepatic lobe consistent with
biopsy proven adenocarcinoma. No additional liver metastasis.
2. No evidence of local breast cancer recurrence in the RIGHT breast
or RIGHT axilla.
3. Mild bilateral hypermetabolic adrenal glands is favored benign
hyperplasia.
4. No evidence of additional metastatic disease on skull base to
thigh FDG PET scan.

## 2018-09-29 MED ORDER — FLUDEOXYGLUCOSE F - 18 (FDG) INJECTION
7.7000 | Freq: Once | INTRAVENOUS | Status: AC | PRN
  Administered 2018-09-29: 7.7 via INTRAVENOUS

## 2018-09-29 NOTE — Progress Notes (Signed)
Recommendations from  [facility] GI Tumor Board 09/28/18  Liver BX - Adenocarcinoma. PET scan for staging and to rule out primary. If PET scan negative, will refer to Dr. Barry Dienes for surgcal consult. Dr. Barry Dienes may need an MRI depending on PET results.

## 2018-09-30 ENCOUNTER — Other Ambulatory Visit: Payer: Self-pay | Admitting: Gastroenterology

## 2018-09-30 NOTE — Progress Notes (Signed)
[place]   Telephone:([phone]) [phone] Fax:(336) [phone]   Clinic Follow up Note   Patient Care Team: [name], MD as PCP - General (Family Medicine) [name], [name] Dad, MD as Consulting Physician (Oncology) [name], [name], MD as Consulting Physician (Cardiology) [name], MD as Consulting Physician (Obstetrics and Gynecology) [name], [name], NP as Nurse Practitioner (Hematology and Oncology) [facility] [name], MD as Consulting Physician (Hematology) [name], [name], MD as Consulting Physician (Gastroenterology) [name], RN as Oncology Nurse Navigator  Date of Service:  10/06/2018  CHIEF COMPLAINT: F/u of cholangiocarcinoma of liver  SUMMARY OF ONCOLOGIC HISTORY: Oncology History  Malignant neoplasm of female breast ([place])  Intrahepatic cholangiocarcinoma ([place])  09/07/2018 Imaging   CT Chest IMPRESSION: 1. New, enhancing mass involving segment 4 of the liver and fundus of gallbladder is concerning for malignancy. This may represent either metastatic disease from breast cancer or neoplasm primary to the liver or hepatic biliary tree. Further evaluation with contrast enhanced CT of the abdomen and pelvis is recommended. 2. No findings to suggest metastatic disease within the chest. 3.  Aortic Atherosclerosis (ICD10-I70.0). 4. Coronary artery calcifications.   09/13/2018 Pathology Results   Diagnosis Liver, needle/core biopsy - ADENOCARCINOMA. Microscopic Comment Immunohistochemistry for CK7 is positive. CK20, TTF1, CDX-2, GATA-3, PAX 8, Qualitative ER, p63 and CK5/6 are negative. The provided clinical history of remote mammary carcinoma is noted. Based on the morphology and immunophenotype of the adenocarcinoma observed in this specimen, primary cholangiocarcinoma is favored. Clinical and radiologic correlation are  encouraged. Results reported to [employer] on 09/15/2018. Intradepartmental consultation  (Dr. Vic Ripper).   09/13/2018 Initial Diagnosis   Cholangiocarcinoma ([place])   09/23/2018 Procedure   Colonoscopy by Dr. Havery Moros 09/23/18  IMPRESSION - Two 3 to 4 mm polyps in the ascending colon, removed with a cold snare. Resected and retrieved. - Five 3 to 5 mm polyps in the transverse colon, removed with a cold snare. Resected and retrieved. - One 5 mm polyp at the splenic flexure, removed with a cold snare. Resected and retrieved. - Three 3 to 5 mm polyps in the sigmoid colon, removed with a cold snare. Resected and retrieved. - The examination was otherwise normal. Upper Endopscy by Dr. Havery Moros 09/23/18  IMPRESSION - Esophagogastric landmarks identified. - 2 cm hiatal hernia. - Normal esophagus otherwise. - A single gastric polyp. Resected and retrieved. - Mild gastritis. Biopsied. - Normal duodenal bulb and second portion of the duodenum.   09/23/2018 Pathology Results   Diagnosis 09/23/18 1. Surgical [P], duodenum - BENIGN SMALL BOWEL MUCOSA. - NO ACTIVE INFLAMMATION OR VILLOUS ATROPHY IDENTIFIED. 2. Surgical [P], stomach, polyp - HYPERPLASTIC POLYP(S). - THERE IS NO EVIDENCE OF MALIGNANCY. 3. Surgical [P], gastric antrum and gastric body - CHRONIC INACTIVE GASTRITIS. - THERE IS NO EVIDENCE OF HELICOBACTER-PYLORI, DYSPLASIA, OR MALIGNANCY. - SEE COMMENT. 4. Surgical [P], colon, sigmoid, splenic flexure, transverse and ascending, polyp (9) - TUBULAR ADENOMA(S). - SESSILE SERRATED POLYP WITHOUT CYTOLOGIC DYSPLASIA. - HIGH GRADE DYSPLASIA IS NOT IDENTIFIED. 5. Surgical [P], colon, sigmoid, polyp (2) - HYPERPLASTIC POLYP(S). - THERE IS NO EVIDENCE OF MALIGNANCY.   09/23/2018 Cancer Staging   Staging form: Intrahepatic Bile Duct, AJCC 8th Edition - Clinical stage from 09/23/2018: Stage IB (cT1b, cN0, cM0) - Signed by [name], MD on 10/06/2018   09/29/2018 PET scan   PET 09/29/18 IMPRESSION: 1. Hypermetabolic mass in the RIGHT hepatic lobe consistent with biopsy  proven adenocarcinoma. No additional liver metastasis. 2. No evidence  of local breast cancer recurrence in the RIGHT breast or RIGHT axilla. 3. Mild bilateral hypermetabolic adrenal glands is favored benign hyperplasia. 4. No evidence of additional metastatic disease on skull base to thigh FDG PET scan.      CURRENT THERAPY:  Pending Surgery   INTERVAL HISTORY:  Donna May is here for a follow up. She notes she is doing well. She noes 1 episode of right groin pain. She notes her BMs are stable. She notes in the summer she does not eat as much as she would in the winter. Her appetite is still adequate. She notes she has 2 children. She told her daughter about her condition but not her son yet. She plans to soon.     REVIEW OF SYSTEMS:   Constitutional: Denies fevers, chills or abnormal weight loss Eyes: Denies blurriness of vision Ears, nose, mouth, throat, and face: Denies mucositis or sore throat Respiratory: Denies cough, dyspnea or wheezes Cardiovascular: Denies palpitation, chest discomfort or lower extremity swelling Gastrointestinal:  Denies nausea, heartburn or change in bowel habits (+) RLQ pain  Skin: Denies abnormal skin rashes Lymphatics: Denies new lymphadenopathy or easy bruising Neurological:Denies numbness, tingling or new weaknesses Behavioral/Psych: Mood is stable, no new changes  All other systems were reviewed with the patient and are negative.  MEDICAL HISTORY:  Past Medical History:  Diagnosis Date  . Anemia   . Arthritis   . Breast cancer ([place]) 1992  . Cataract   . Diabetes mellitus    type 2  . Duodenitis 01/18/2002  . Fainting spell   . GERD (gastroesophageal reflux disease)   . Heart murmur   . Hiatal hernia 08/08/2008, 01/18/2002  . History of pneumonia   . Hyperlipidemia   . Hypertension   . Lymphedema 2017   Right arm  . UTI (lower urinary tract infection)     SURGICAL HISTORY: Past Surgical History:  Procedure Laterality Date  .  AXILLARY SURGERY     cyst removal, right  . LIVER BIOPSY  08/2018   Dr. Lindwood Coke  . MASTECTOMY  1992   right, with flap  . NM MYOCAR PERF WALL MOTION  06/11/2009   Protocol:[place], post stress EF58%, EKG negative for ischemia, low risk  . TONSILLECTOMY    . TRANSTHORACIC ECHOCARDIOGRAM  12/24/2009   LVEF =>55%, normal study    I have reviewed the social history and family history with the patient and they are unchanged from previous note.  ALLERGIES:  has No Known Allergies.  MEDICATIONS:  Current Outpatient Medications  Medication Sig Dispense Refill  . amLODipine (NORVASC) 10 MG tablet TAKE 1 TABLET BY MOUTH EVERY DAY 90 tablet 2  . aspirin 81 MG tablet Take 81 mg by mouth daily.     Marland Kitchen CALCIUM-MAGNESIUM-VITAMIN D PO Take 1 tablet by mouth daily.    . Cholecalciferol (VITAMIN D3) 2000 UNITS TABS Take 1 tablet by mouth daily as needed.     . Echinacea 400 MG CAPS Take 1 capsule by mouth daily.    Marland Kitchen glucose blood (ACCU-CHEK AVIVA) test strip Use as instructed 100 each 4  . ketoconazole (NIZORAL) 2 % cream Apply 1 fingertip amount to each foot daily. 30 g 0  . lansoprazole (PREVACID) 15 MG capsule TAKE 1 CAPSULE DAILY BEFOREBREAKFAST (NEED TO MAKE AN OFFICE VISIT FOR FURTHER   REFILLS) 90 capsule 1  . metFORMIN (GLUCOPHAGE-XR) 500 MG 24 hr tablet Take 1 tablet (500 mg total) by mouth 2 (two) times daily before a meal.  180 tablet 1  . metoprolol succinate (TOPROL-XL) 100 MG 24 hr tablet TAKE 1 TABLET (100 MG TOTAL) BY MOUTH DAILY. TAKE WITH OR IMMEDIATELY FOLLOWING A MEAL. 90 tablet 1  . Multiple Vitamins-Minerals (CENTRUM SILVER ULTRA WOMENS) TABS Take 1 tablet by mouth daily as needed.     . Omega 3 1000 MG CAPS Take 2 capsules by mouth daily.     . promethazine-dextromethorphan (PROMETHAZINE-DM) 6.25-15 MG/5ML syrup Take 5 mLs by mouth 3 (three) times daily as needed for cough. 100 mL 0  . raloxifene (EVISTA) 60 MG tablet TAKE 1 TABLET DAILY 90 tablet 0  . rosuvastatin (CRESTOR) 20 MG  tablet Take 1 tablet (20 mg total) by mouth daily. 90 tablet 0  . sitaGLIPtin (JANUVIA) 100 MG tablet Take 1 tablet (100 mg total) by mouth daily. 90 tablet 4  . triamcinolone cream (KENALOG) 0.1 % Apply 1 application topically 2 (two) times daily. 30 g 0  . valsartan (DIOVAN) 320 MG tablet Take 1 tablet (320 mg total) by mouth daily. 90 tablet 1  . hydrochlorothiazide (HYDRODIURIL) 25 MG tablet Take 1 tablet (25 mg total) by mouth daily. 90 tablet 1   Current Facility-Administered Medications  Medication Dose Route Frequency Provider Last Rate Last Dose  . triamcinolone acetonide (KENALOG) 10 MG/ML injection 10 mg  10 mg Other Once Harriet Masson, DPM        PHYSICAL EXAMINATION: ECOG PERFORMANCE STATUS: 0 - Asymptomatic  Vitals:   10/06/18 1111  BP: (!) 152/61  Pulse: 62  Resp: 17  Temp: 97.7 F (36.5 C)  SpO2: 100%   Filed Weights   10/06/18 1111  Weight: 151 lb 11.2 oz (68.8 kg)    GENERAL:alert, no distress and comfortable SKIN: skin color, texture, turgor are normal, no rashes or significant lesions EYES: normal, Conjunctiva are pink and non-injected, sclera clear  NECK: supple, thyroid normal size, non-tender, without nodularity LYMPH:  no palpable lymphadenopathy in the cervical, axillary  LUNGS: clear to auscultation and percussion with normal breathing effort HEART: regular rate & rhythm and no murmurs and no lower extremity edema ABDOMEN:abdomen soft, non-tender and normal bowel sounds Musculoskeletal:no cyanosis of digits and no clubbing  NEURO: alert & oriented x 3 with fluent speech, no focal motor/sensory deficits  LABORATORY DATA:  I have reviewed the data as listed CBC Latest Ref Rng & Units 09/22/2018 09/13/2018 04/26/2017  WBC 4.0 - 10.5 K/uL 5.3 6.9 5.7  Hemoglobin 12.0 - 15.0 g/dL 12.7 13.0 12.9  Hematocrit 36.0 - 46.0 % 39.4 42.0 39.2  Platelets 150 - 400 K/uL 231 216 235     CMP Latest Ref Rng & Units 09/22/2018 09/13/2018 09/01/2018  Glucose 70 - 99  mg/dL 127(H) 83 83  BUN 8 - 23 mg/dL 25(H) 31(H) 21  Creatinine 0.44 - 1.00 mg/dL 1.46(H) 1.78(H) 1.40(H)  Sodium 135 - 145 mmol/L 140 141 141  Potassium 3.5 - 5.1 mmol/L 4.0 5.4(H) 3.3(L)  Chloride 98 - 111 mmol/L 104 106 103  CO2 22 - 32 mmol/L 25 26 31   Calcium 8.9 - 10.3 mg/dL 9.5 9.5 9.4  Total Protein 6.5 - 8.1 g/dL 7.2 7.8 6.6  Total Bilirubin 0.3 - 1.2 mg/dL 0.4 0.7 0.3  Alkaline Phos 38 - 126 U/L 52 52 51  AST 15 - 41 U/L 15 25 14   ALT 0 - 44 U/L 9 13 11       RADIOGRAPHIC STUDIES: I have personally reviewed the radiological images as listed and agreed with the findings in  the report. No results found.   ASSESSMENT & PLAN:  Donna May is a 78 y.o. female with   1. Intrahepatic cholangiocarcinoma, cT1N0M0, stage IB -We reviewed her medical record in detail with the patient. She presented with mild weight loss, no significant abdominal pain. Biopsy of her liver mass shows adenocarcinoma, positive for CK7; negative for CK20, TTF1, CDX-2, GATA, PAX 8, qualitative ER, p63, and CK5/6. This is favored to be cholangiocarcinoma -We discussed her EGD/Colonoscopy from 09/23/18 and 09/29/18 PET scan which shows all polyps removed were benign and her cancer is localized to the liver, no evidence of distant metastasis or other primary tumor. Based on the above workup, this is most consistent with intrahepatic cholangiocarcinoma -Given her localized disease, she is likely eligible for surgery.  We discussed in GI tumor board last week.  Dr. Barry Dienes recommends MRI of abdomen before proceeding with surgery. Given she is claustrophobic I will request open MRI. She wants to see the open MRI to see if she can do it.  -I discussed although the goal of surgery is curative, she still has high risk of recurrence or may have residual disease. I recommend adjuvant chemo with oral Xeloda to reduce this risk or further treat her. She understands and is interested.  -Will follow up with her after surgery to  start adjuvant Xeloda  -She does have RLQ pain the last few days. Will watch and she will notify me if this worsens.   2. H/o right breast cancer, Genetics   -s/p mastectomy in 1992, per patient did not require adjuvant therapy  -followed by Dr. Jana Hakim  -Given her cancer history, I will see if she is eligible for genetic testing. She is interested as she does have 2 children.    3. DM, HTN, hiatal hernia, GERD, renal artery stenosis  -followed by GI Dr. Havery Moros; last colonoscopy in 2010 was normal, previous EGD showed 4-5 cm hiatal hernia -followed by Dr. Sallyanne Kuster for routine cardiology f/u -I encouraged her to gain some weight before she proceeds with surgery. I encouraged her to increase her calorie intake. She can use Glucerna if she is not eating enough.   4. Family support  -she has a son and daughter, Her daughter known of her condition but her son does not know yet. She plans to discuss with them once she has more answers.  -she has spoken with [name], RN navigator on the phone   PLAN: -EGD/Colonoscopy and PET scan reviewed with patient. Her cancer is localized, she will see Dr. Barry Dienes next week for surgical resection  -pt wants to see the open MRI to see if she can do it  -Send genetic referral if she qualifies  -Proceed with surgery  -F/u 3 weeks after surgery    No problem-specific Assessment & Plan notes found for this encounter.   No orders of the defined types were placed in this encounter.  All questions were answered. The patient knows to call the clinic with any problems, questions or concerns. No barriers to learning was detected. I spent 20 minutes counseling the patient face to face. The total time spent in the appointment was 25 minutes and more than 50% was on counseling and review of test results     [name], MD 10/06/2018   I, Joslyn Devon, am acting as scribe for [name], MD.   I have reviewed the above documentation for accuracy and  completeness, and I agree with the above.

## 2018-10-06 ENCOUNTER — Inpatient Hospital Stay (HOSPITAL_BASED_OUTPATIENT_CLINIC_OR_DEPARTMENT_OTHER): Payer: Medicare Other | Admitting: Hematology

## 2018-10-06 ENCOUNTER — Other Ambulatory Visit: Payer: Self-pay

## 2018-10-06 ENCOUNTER — Encounter: Payer: Self-pay | Admitting: Hematology

## 2018-10-06 VITALS — BP 152/61 | HR 62 | Temp 97.7°F | Resp 17 | Ht 65.0 in | Wt 151.7 lb

## 2018-10-06 DIAGNOSIS — E1122 Type 2 diabetes mellitus with diabetic chronic kidney disease: Secondary | ICD-10-CM

## 2018-10-06 DIAGNOSIS — Z86000 Personal history of in-situ neoplasm of breast: Secondary | ICD-10-CM

## 2018-10-06 DIAGNOSIS — I129 Hypertensive chronic kidney disease with stage 1 through stage 4 chronic kidney disease, or unspecified chronic kidney disease: Secondary | ICD-10-CM | POA: Diagnosis not present

## 2018-10-06 DIAGNOSIS — N189 Chronic kidney disease, unspecified: Secondary | ICD-10-CM | POA: Diagnosis not present

## 2018-10-06 DIAGNOSIS — C787 Secondary malignant neoplasm of liver and intrahepatic bile duct: Secondary | ICD-10-CM

## 2018-10-06 DIAGNOSIS — Z87891 Personal history of nicotine dependence: Secondary | ICD-10-CM | POA: Diagnosis not present

## 2018-10-06 DIAGNOSIS — C221 Intrahepatic bile duct carcinoma: Secondary | ICD-10-CM

## 2018-10-06 DIAGNOSIS — C801 Malignant (primary) neoplasm, unspecified: Secondary | ICD-10-CM

## 2018-10-06 DIAGNOSIS — Z79899 Other long term (current) drug therapy: Secondary | ICD-10-CM | POA: Diagnosis not present

## 2018-10-07 ENCOUNTER — Telehealth: Payer: Self-pay | Admitting: Hematology

## 2018-10-07 ENCOUNTER — Other Ambulatory Visit: Payer: Self-pay | Admitting: Hematology

## 2018-10-07 DIAGNOSIS — C221 Intrahepatic bile duct carcinoma: Secondary | ICD-10-CM

## 2018-10-07 NOTE — Telephone Encounter (Signed)
No los per 7/23. °

## 2018-10-07 NOTE — Telephone Encounter (Signed)
Called patient and scheduled a gen. appt per 7/24 staff message.  Patient aware of the appt date and time.

## 2018-10-10 ENCOUNTER — Other Ambulatory Visit: Payer: Self-pay | Admitting: Genetic Counselor

## 2018-10-10 ENCOUNTER — Telehealth: Payer: Self-pay | Admitting: Licensed Clinical Social Worker

## 2018-10-10 ENCOUNTER — Telehealth: Payer: Self-pay | Admitting: Genetic Counselor

## 2018-10-10 DIAGNOSIS — C221 Intrahepatic bile duct carcinoma: Secondary | ICD-10-CM

## 2018-10-10 DIAGNOSIS — C50911 Malignant neoplasm of unspecified site of right female breast: Secondary | ICD-10-CM

## 2018-10-10 NOTE — Telephone Encounter (Signed)
error 

## 2018-10-10 NOTE — Telephone Encounter (Signed)
Returned call from Donna May regarding her genetic counseling appointment (11am, 10/11/18) and confirmed that she should be able to make her next appointment at 1:15pm.

## 2018-10-11 ENCOUNTER — Inpatient Hospital Stay: Payer: Medicare Other

## 2018-10-11 ENCOUNTER — Inpatient Hospital Stay (HOSPITAL_BASED_OUTPATIENT_CLINIC_OR_DEPARTMENT_OTHER): Payer: Medicare Other | Admitting: Genetic Counselor

## 2018-10-11 ENCOUNTER — Encounter: Payer: Self-pay | Admitting: Genetic Counselor

## 2018-10-11 ENCOUNTER — Other Ambulatory Visit: Payer: Self-pay

## 2018-10-11 ENCOUNTER — Other Ambulatory Visit: Payer: Self-pay | Admitting: General Surgery

## 2018-10-11 DIAGNOSIS — Z8042 Family history of malignant neoplasm of prostate: Secondary | ICD-10-CM | POA: Diagnosis not present

## 2018-10-11 DIAGNOSIS — I129 Hypertensive chronic kidney disease with stage 1 through stage 4 chronic kidney disease, or unspecified chronic kidney disease: Secondary | ICD-10-CM

## 2018-10-11 DIAGNOSIS — E1122 Type 2 diabetes mellitus with diabetic chronic kidney disease: Secondary | ICD-10-CM | POA: Diagnosis not present

## 2018-10-11 DIAGNOSIS — D0511 Intraductal carcinoma in situ of right breast: Secondary | ICD-10-CM | POA: Diagnosis not present

## 2018-10-11 DIAGNOSIS — C221 Intrahepatic bile duct carcinoma: Secondary | ICD-10-CM

## 2018-10-11 DIAGNOSIS — Z171 Estrogen receptor negative status [ER-]: Secondary | ICD-10-CM | POA: Diagnosis not present

## 2018-10-11 DIAGNOSIS — C787 Secondary malignant neoplasm of liver and intrahepatic bile duct: Secondary | ICD-10-CM

## 2018-10-11 DIAGNOSIS — Z803 Family history of malignant neoplasm of breast: Secondary | ICD-10-CM | POA: Insufficient documentation

## 2018-10-11 DIAGNOSIS — Z86000 Personal history of in-situ neoplasm of breast: Secondary | ICD-10-CM

## 2018-10-11 DIAGNOSIS — C50911 Malignant neoplasm of unspecified site of right female breast: Secondary | ICD-10-CM

## 2018-10-11 DIAGNOSIS — C229 Malignant neoplasm of liver, not specified as primary or secondary: Secondary | ICD-10-CM | POA: Diagnosis not present

## 2018-10-11 NOTE — Progress Notes (Signed)
REFERRING PROVIDER: [name], MD [address] [place],  [place] [address]  PRIMARY PROVIDER:  [name], MD  PRIMARY REASON FOR VISIT:  1. Malignant neoplasm of right breast in female, estrogen receptor negative, unspecified site of breast ([place])   2. Family history of prostate cancer   3. Family history of breast cancer   4. Intrahepatic cholangiocarcinoma ([place])     HISTORY OF PRESENT ILLNESS:   Ms. Chow, a 78 y.o. female, was seen for a [facility] cancer genetics consultation at the request of Dr. Burr Medico due to a personal history of breast cancer and intrahepatic cholangiocarcinoma and a family history of breast and prostate cancer.  Ms. Surratt presents to clinic today to discuss the possibility of a hereditary predisposition to cancer, genetic testing, and to further clarify her future cancer risks, as well as potential cancer risks for family members.   In 1992, at the age of 20-50, Ms. Osorno was diagnosed with DCIS of the right breast. The treatment plan included surgery.  In 2020, at the age of 82, Ms. Angeletti was diagnosed with intrahepatic cholangiocarcinoma. The treatment plan includes surgery and chemo.  CANCER HISTORY:  Oncology History  Malignant neoplasm of female breast ([place])  Intrahepatic cholangiocarcinoma ([place])  09/07/2018 Imaging   CT Chest IMPRESSION: 1. New, enhancing mass involving segment 4 of the liver and fundus of gallbladder is concerning for malignancy. This may represent either metastatic disease from breast cancer or neoplasm primary to the liver or hepatic biliary tree. Further evaluation with contrast enhanced CT of the abdomen and pelvis is recommended. 2. No findings to suggest metastatic disease within the chest. 3.  Aortic Atherosclerosis (ICD10-I70.0). 4. Coronary artery calcifications.   09/13/2018 Pathology Results   Diagnosis Liver, needle/core biopsy - ADENOCARCINOMA. Microscopic Comment Immunohistochemistry for CK7 is positive.  CK20, TTF1, CDX-2, GATA-3, PAX 8, Qualitative ER, p63 and CK5/6 are negative. The provided clinical history of remote mammary carcinoma is noted. Based on the morphology and immunophenotype of the adenocarcinoma observed in this specimen, primary cholangiocarcinoma is favored. Clinical and radiologic correlation are  encouraged. Results reported to [employer] on 09/15/2018. Intradepartmental consultation (Dr. Vic Ripper).   09/13/2018 Initial Diagnosis   Cholangiocarcinoma ([place])   09/23/2018 Procedure   Colonoscopy by Dr. Havery Moros 09/23/18  IMPRESSION - Two 3 to 4 mm polyps in the ascending colon, removed with a cold snare. Resected and retrieved. - Five 3 to 5 mm polyps in the transverse colon, removed with a cold snare. Resected and retrieved. - One 5 mm polyp at the splenic flexure, removed with a cold snare. Resected and retrieved. - Three 3 to 5 mm polyps in the sigmoid colon, removed with a cold snare. Resected and retrieved. - The examination was otherwise normal. Upper Endopscy by Dr. Havery Moros 09/23/18  IMPRESSION - Esophagogastric landmarks identified. - 2 cm hiatal hernia. - Normal esophagus otherwise. - A single gastric polyp. Resected and retrieved. - Mild gastritis. Biopsied. - Normal duodenal bulb and second portion of the duodenum.   09/23/2018 Pathology Results   Diagnosis 09/23/18 1. Surgical [P], duodenum - BENIGN SMALL BOWEL MUCOSA. - NO ACTIVE INFLAMMATION OR VILLOUS ATROPHY IDENTIFIED. 2. Surgical [P], stomach, polyp - HYPERPLASTIC POLYP(S). - THERE IS NO EVIDENCE OF MALIGNANCY. 3. Surgical [P], gastric antrum and gastric body - CHRONIC INACTIVE GASTRITIS. - THERE IS NO EVIDENCE OF HELICOBACTER-PYLORI, DYSPLASIA, OR MALIGNANCY. - SEE COMMENT. 4. Surgical [P], colon, sigmoid, splenic flexure, transverse and ascending, polyp (9) - TUBULAR ADENOMA(S). - SESSILE SERRATED  POLYP WITHOUT CYTOLOGIC DYSPLASIA. - HIGH GRADE DYSPLASIA IS NOT IDENTIFIED. 5. Surgical  [P], colon, sigmoid, polyp (2) - HYPERPLASTIC POLYP(S). - THERE IS NO EVIDENCE OF MALIGNANCY.   09/23/2018 Cancer Staging   Staging form: Intrahepatic Bile Duct, AJCC 8th Edition - Clinical stage from 09/23/2018: Stage IB (cT1b, cN0, cM0) - Signed by [name], MD on 10/06/2018   09/29/2018 PET scan   PET 09/29/18 IMPRESSION: 1. Hypermetabolic mass in the RIGHT hepatic lobe consistent with biopsy proven adenocarcinoma. No additional liver metastasis. 2. No evidence of local breast cancer recurrence in the RIGHT breast or RIGHT axilla. 3. Mild bilateral hypermetabolic adrenal glands is favored benign hyperplasia. 4. No evidence of additional metastatic disease on skull base to thigh FDG PET scan.      RISK FACTORS:  Menarche was at age 69.  First live birth at age 18.  OCP use for approximately 17 years.  Ovaries intact: yes.  Hysterectomy: no.  Menopausal status: postmenopausal.  HRT use: 0 years. Colonoscopy: yes; 11 polyps (8 tubular adenomas) in 2020 . Mammogram within the last year: yes.   Past Medical History:  Diagnosis Date  . Anemia   . Arthritis   . Breast cancer ([facility]) 1992  . Cataract   . Diabetes mellitus    type 2  . Duodenitis 01/18/2002  . Fainting spell   . Family history of breast cancer   . Family history of prostate cancer   . GERD (gastroesophageal reflux disease)   . Heart murmur   . Hiatal hernia 08/08/2008, 01/18/2002  . History of pneumonia   . Hyperlipidemia   . Hypertension   . Lymphedema 2017   Right arm  . UTI (lower urinary tract infection)     Past Surgical History:  Procedure Laterality Date  . AXILLARY SURGERY     cyst removal, right  . LIVER BIOPSY  08/2018   Dr. Lindwood Coke  . MASTECTOMY  1992   right, with flap  . NM MYOCAR PERF WALL MOTION  06/11/2009   Protocol:[place], post stress EF58%, EKG negative for ischemia, low risk  . TONSILLECTOMY    . TRANSTHORACIC ECHOCARDIOGRAM  12/24/2009   LVEF =>55%, normal study    Social  History   Socioeconomic History  . Marital status: Single    Spouse name: Not on file  . Number of children: 2  . Years of education: Not on file  . Highest education level: Not on file  Occupational History  . Occupation: retired  Scientific laboratory technician  . Financial resource strain: Not hard at all  . Food insecurity    Worry: Never true    Inability: Never true  . Transportation needs    Medical: No    Non-medical: No  Tobacco Use  . Smoking status: Former Smoker    Packs/day: 0.01    Years: 10.00    Pack years: 0.10  . Smokeless tobacco: Never Used  . Tobacco comment: quit 78 yo;   Substance and Sexual Activity  . Alcohol use: Not Currently    Alcohol/week: 1.0 standard drinks    Types: 1 Glasses of wine per week    Comment: occasional, no h/o heavy alcohol use   . Drug use: No  . Sexual activity: Yes    Partners: Male    Birth control/protection: Condom    Comment: First sexual encounter age 25. Fewer than 5 partners in life time.  Lifestyle  . Physical activity    Days per week: 0 days  Minutes per session: 0 min  . Stress: Not at all  Relationships  . Social Herbalist on phone: Never    Gets together: Not on file    Attends religious service: More than 4 times per year    Active member of club or organization: No    Attends meetings of clubs or organizations: Never    Relationship status: Not on file  Other Topics Concern  . 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     FAMILY HISTORY:  We obtained a detailed, 4-generation family history.  Significant diagnoses are listed below: Family History  Problem Relation Age of Onset  . Breast cancer Cousin        diagnosed >50; mother's first cousins  . Diabetes Brother   . Heart  disease Mother   . Hypertension Mother   . Heart disease Father   . Hypertension Father   . Prostate cancer Brother 72  . Heart attack Maternal Grandmother   . Stroke Maternal Grandfather   . Colon cancer Neg Hx   . Esophageal cancer Neg Hx   . Stomach cancer Neg Hx   . Rectal cancer Neg Hx      Ms. Labuda has one brother with prostate cancer diagnosed at age 80. She has another brother who died at the age of 49.  Ms. Gagen mother died at the age of 4 and had no history of cancer. Her mother had two first cousins diagnosed with breast cancer when they were older than 49. There are no other known diagnoses of cancer on her maternal side.  Ms. Debellis father died at the age of 81 and had no history of cancer. There are no other known diagnoses of cancer on her paternal side.   Ms. Koors is unaware of previous family history of genetic testing for hereditary cancer risks. Ms. Limb maternal ancestors are of African American and Native American descent, and paternal ancestors are of African American descent. There is no reported Ashkenazi Jewish ancestry. There is no known consanguinity.  GENETIC COUNSELING ASSESSMENT: Ms. Baillie is a 78 y.o. female with a personal history of breast cancer and intrahepatic cholangiocarcinoma and a family history of breast and prostate cancer, which is somewhat suggestive of a hereditary cancer syndrome and predisposition to cancer. We, therefore, discussed and recommended the following at today's visit.   DISCUSSION: We discussed that 5 - 10% of breast cancer is hereditary, with most cases associated with BRCA1/2.  There are other genes that can be associated with hereditary breast cancer syndromes.  These include ATM, CHEK2, PALB2, etc.  We also discussed that bile duct cancers can sometimes be associated with a hereditary cancer syndrome (Lynch syndrome), particularly when there is also a personal and/or family history of colon or endometrial cancers.  We  discussed that testing is beneficial for several reasons including knowing about other cancer risks, identifying potential screening and risk-reduction options that may be appropriate, and to understand if other family members could be at risk for cancer and allow them to undergo genetic testing.   We reviewed the characteristics, features and inheritance patterns of hereditary cancer syndromes. We also discussed genetic testing, including the appropriate family members to  test, the process of testing, insurance coverage and turn-around-time for results.  We discussed the implications of a negative, positive and/or variant of uncertain significant result. We recommended Ms. Lardner pursue genetic testing for the [facility] Common Hereditary Cancers gene panel.   The Common Hereditary Gene Panel offered by [facility] includes sequencing and/or deletion duplication testing of the following 48 genes: APC, ATM, AXIN2, BARD1, BMPR1A, BRCA1, BRCA2, BRIP1, CDH1, CDK4, CDKN2A (p14ARF), CDKN2A (p16INK4a), CHEK2, CTNNA1, DICER1, EPCAM (Deletion/duplication testing only), GREM1 (promoter region deletion/duplication testing only), KIT, MEN1, MLH1, MSH2, MSH3, MSH6, MUTYH, NBN, NF1, NHTL1, PALB2, PDGFRA, PMS2, POLD1, POLE, PTEN, RAD50, RAD51C, RAD51D, RNF43, SDHB, SDHC, SDHD, SMAD4, SMARCA4. STK11, TP53, TSC1, TSC2, and VHL.  The following genes were evaluated for sequence changes only: SDHA and HOXB13 c.251G>A variant only.   Although Ms. Bourassa does not meet NCCN criteria for genetic testing, she feels that the information would be beneficial for her family members.  If her out of pocket cost for testing is over $100, the laboratory will call and confirm whether she wants to proceed with testing.  If the out of pocket cost of testing is less than $100 she will be billed by the genetic testing laboratory.   PLAN: After considering the risks, benefits, and limitations, Ms. Marsico provided informed consent to pursue genetic testing  and the blood sample was sent to [facility] for analysis of the Common Hereditary Cancers panel. Results should be available within approximately two-three weeks' time, at which point they will be disclosed by telephone to Ms. Hession, as will any additional recommendations warranted by these results. Ms. Vassel will receive a summary of her genetic counseling visit and a copy of her results once available. This information will also be available in Epic.   Ms. Giel questions were answered to her satisfaction today. Our contact information was provided should additional questions or concerns arise. Thank you for the referral and allowing [place] to share in the care of your patient.   Clint Guy, MS Genetic Counselor [email or web address] Phone: [phone]   The patient was seen for a total of 30 minutes in face-to-face genetic counseling.  This patient was discussed with Drs. [name], Lindi Adie and/or Burr Medico who agrees with the above.    _______________________________________________________________________ For Office Staff:  Number of people involved in session: 1 Was an Intern/ student involved with case: no

## 2018-11-02 DIAGNOSIS — I10 Essential (primary) hypertension: Secondary | ICD-10-CM | POA: Diagnosis not present

## 2018-11-02 DIAGNOSIS — Z8505 Personal history of malignant neoplasm of liver: Secondary | ICD-10-CM | POA: Diagnosis not present

## 2018-11-02 DIAGNOSIS — Z853 Personal history of malignant neoplasm of breast: Secondary | ICD-10-CM | POA: Diagnosis not present

## 2018-11-02 DIAGNOSIS — E119 Type 2 diabetes mellitus without complications: Secondary | ICD-10-CM | POA: Diagnosis not present

## 2018-11-02 DIAGNOSIS — I428 Other cardiomyopathies: Secondary | ICD-10-CM | POA: Diagnosis not present

## 2018-11-02 DIAGNOSIS — I498 Other specified cardiac arrhythmias: Secondary | ICD-10-CM | POA: Diagnosis not present

## 2018-11-02 DIAGNOSIS — I471 Supraventricular tachycardia: Secondary | ICD-10-CM | POA: Diagnosis not present

## 2018-11-02 DIAGNOSIS — Z8249 Family history of ischemic heart disease and other diseases of the circulatory system: Secondary | ICD-10-CM | POA: Diagnosis not present

## 2018-11-02 DIAGNOSIS — I213 ST elevation (STEMI) myocardial infarction of unspecified site: Secondary | ICD-10-CM | POA: Diagnosis not present

## 2018-11-02 DIAGNOSIS — I25110 Atherosclerotic heart disease of native coronary artery with unstable angina pectoris: Secondary | ICD-10-CM | POA: Diagnosis not present

## 2018-11-07 ENCOUNTER — Encounter: Payer: Self-pay | Admitting: Genetic Counselor

## 2018-11-07 ENCOUNTER — Telehealth: Payer: Self-pay | Admitting: Genetic Counselor

## 2018-11-07 ENCOUNTER — Ambulatory Visit: Payer: Self-pay | Admitting: Genetic Counselor

## 2018-11-07 DIAGNOSIS — Z1379 Encounter for other screening for genetic and chromosomal anomalies: Secondary | ICD-10-CM | POA: Insufficient documentation

## 2018-11-07 NOTE — Telephone Encounter (Signed)
Revealed negative genetic testing.  Discussed that we do not know why she has had cancer or why there is cancer in the family. It could be due to a different gene that we are not testing, or maybe our current technology may not be able to pick something up.  It will be important for her to keep in contact with genetics to keep up with whether additional testing may be needed. A variant of uncertain significance was identified in the APC gene (c.1243G>A). Her results are still considered normal and this variant should not change medical management for Donna May.

## 2018-11-07 NOTE — Progress Notes (Signed)
HPI:  Donna May was previously seen in the [facility] due to a personal history of breast cancer and cholangiocarcinoma and a family history of breast and prostate cancer, and concerns regarding a hereditary predisposition to cancer. Please refer to our prior cancer genetics clinic note for more information regarding our discussion, assessment and recommendations, at the time. Donna May recent genetic test results were disclosed to her, as were recommendations warranted by these results. These results and recommendations are discussed in more detail below.  CANCER HISTORY:  Oncology History  Malignant neoplasm of female breast ([place])  11/06/2018 Genetic Testing   Negative genetic testing on the [facility] Common Hereditary Cancers panel. A variant of uncertain significance was identified in one of her APC genes, called c.1243G>A (p.Ala415Thr).  The Common Hereditary Cancers Panel offered by [facility] includes sequencing and/or deletion duplication testing of the following 48 genes: APC, ATM, AXIN2, BARD1, BMPR1A, BRCA1, BRCA2, BRIP1, CDH1, CDK4, CDKN2A (p14ARF), CDKN2A (p16INK4a), CHEK2, CTNNA1, DICER1, EPCAM (Deletion/duplication testing only), GREM1 (promoter region deletion/duplication testing only), KIT, MEN1, MLH1, MSH2, MSH3, MSH6, MUTYH, NBN, NF1, NHTL1, PALB2, PDGFRA, PMS2, POLD1, POLE, PTEN, RAD50, RAD51C, RAD51D, RNF43, SDHB, SDHC, SDHD, SMAD4, SMARCA4. STK11, TP53, TSC1, TSC2, and VHL.  The following genes were evaluated for sequence changes only: SDHA and HOXB13 c.251G>A variant only.    Intrahepatic cholangiocarcinoma ([place])  09/07/2018 Imaging   CT Chest IMPRESSION: 1. New, enhancing mass involving segment 4 of the liver and fundus of gallbladder is concerning for malignancy. This may represent either metastatic disease from breast cancer or neoplasm primary to the liver or hepatic biliary tree. Further evaluation with contrast enhanced CT of the abdomen and pelvis is  recommended. 2. No findings to suggest metastatic disease within the chest. 3.  Aortic Atherosclerosis (ICD10-I70.0). 4. Coronary artery calcifications.   09/13/2018 Pathology Results   Diagnosis Liver, needle/core biopsy - ADENOCARCINOMA. Microscopic Comment Immunohistochemistry for CK7 is positive. CK20, TTF1, CDX-2, GATA-3, PAX 8, Qualitative ER, p63 and CK5/6 are negative. The provided clinical history of remote mammary carcinoma is noted. Based on the morphology and immunophenotype of the adenocarcinoma observed in this specimen, primary cholangiocarcinoma is favored. Clinical and radiologic correlation are  encouraged. Results reported to [employer] on 09/15/2018. Intradepartmental consultation (Dr. Vic Ripper).   09/13/2018 Initial Diagnosis   Cholangiocarcinoma ([place])   09/23/2018 Procedure   Colonoscopy by Dr. Havery Moros 09/23/18  IMPRESSION - Two 3 to 4 mm polyps in the ascending colon, removed with a cold snare. Resected and retrieved. - Five 3 to 5 mm polyps in the transverse colon, removed with a cold snare. Resected and retrieved. - One 5 mm polyp at the splenic flexure, removed with a cold snare. Resected and retrieved. - Three 3 to 5 mm polyps in the sigmoid colon, removed with a cold snare. Resected and retrieved. - The examination was otherwise normal. Upper Endopscy by Dr. Havery Moros 09/23/18  IMPRESSION - Esophagogastric landmarks identified. - 2 cm hiatal hernia. - Normal esophagus otherwise. - A single gastric polyp. Resected and retrieved. - Mild gastritis. Biopsied. - Normal duodenal bulb and second portion of the duodenum.   09/23/2018 Pathology Results   Diagnosis 09/23/18 1. Surgical [P], duodenum - BENIGN SMALL BOWEL MUCOSA. - NO ACTIVE INFLAMMATION OR VILLOUS ATROPHY IDENTIFIED. 2. Surgical [P], stomach, polyp - HYPERPLASTIC POLYP(S). - THERE IS NO EVIDENCE OF MALIGNANCY. 3. Surgical [P], gastric antrum and gastric body - CHRONIC INACTIVE  GASTRITIS. - THERE IS NO EVIDENCE OF HELICOBACTER-PYLORI, DYSPLASIA, OR MALIGNANCY. -  SEE COMMENT. 4. Surgical [P], colon, sigmoid, splenic flexure, transverse and ascending, polyp (9) - TUBULAR ADENOMA(S). - SESSILE SERRATED POLYP WITHOUT CYTOLOGIC DYSPLASIA. - HIGH GRADE DYSPLASIA IS NOT IDENTIFIED. 5. Surgical [P], colon, sigmoid, polyp (2) - HYPERPLASTIC POLYP(S). - THERE IS NO EVIDENCE OF MALIGNANCY.   09/23/2018 Cancer Staging   Staging form: Intrahepatic Bile Duct, AJCC 8th Edition - Clinical stage from 09/23/2018: Stage IB (cT1b, cN0, cM0) - Signed by [name], MD on 10/06/2018   09/29/2018 PET scan   PET 09/29/18 IMPRESSION: 1. Hypermetabolic mass in the RIGHT hepatic lobe consistent with biopsy proven adenocarcinoma. No additional liver metastasis. 2. No evidence of local breast cancer recurrence in the RIGHT breast or RIGHT axilla. 3. Mild bilateral hypermetabolic adrenal glands is favored benign hyperplasia. 4. No evidence of additional metastatic disease on skull base to thigh FDG PET scan.   11/06/2018 Genetic Testing   Negative genetic testing on the [facility] Common Hereditary Cancers panel. A variant of uncertain significance was identified in one of her APC genes, called c.1243G>A (p.Ala415Thr).  The Common Hereditary Cancers Panel offered by [facility] includes sequencing and/or deletion duplication testing of the following 48 genes: APC, ATM, AXIN2, BARD1, BMPR1A, BRCA1, BRCA2, BRIP1, CDH1, CDK4, CDKN2A (p14ARF), CDKN2A (p16INK4a), CHEK2, CTNNA1, DICER1, EPCAM (Deletion/duplication testing only), GREM1 (promoter region deletion/duplication testing only), KIT, MEN1, MLH1, MSH2, MSH3, MSH6, MUTYH, NBN, NF1, NHTL1, PALB2, PDGFRA, PMS2, POLD1, POLE, PTEN, RAD50, RAD51C, RAD51D, RNF43, SDHB, SDHC, SDHD, SMAD4, SMARCA4. STK11, TP53, TSC1, TSC2, and VHL.  The following genes were evaluated for sequence changes only: SDHA and HOXB13 c.251G>A variant only.      FAMILY HISTORY:  We  obtained a detailed, 4-generation family history.  Significant diagnoses are listed below: Family History  Problem Relation Age of Onset   Breast cancer Cousin        diagnosed >50; mother's first cousins   Diabetes Brother    Heart disease Mother    Hypertension Mother    Heart disease Father    Hypertension Father    Prostate cancer Brother 72   Heart attack Maternal Grandmother    Stroke Maternal Grandfather    Colon cancer Neg Hx    Esophageal cancer Neg Hx    Stomach cancer Neg Hx    Rectal cancer Neg Hx       Donna May has one brother with prostate cancer diagnosed at age 90. She has another brother who died at the age of 24.  Donna May mother died at the age of 17 and had no history of cancer. Her mother had two first cousins diagnosed with breast cancer when they were older than 22. There are no other known diagnoses of cancer on her maternal side.  Donna May father died at the age of 43 and had no history of cancer. There are no other known diagnoses of cancer on her paternal side.   Donna May is unaware of previous family history of genetic testing for hereditary cancer risks. Donna May maternal ancestors are of African American and Native American descent, and paternal ancestors are of African American descent. There is no reported Ashkenazi Jewish ancestry. There is no known consanguinity.  GENETIC TEST RESULTS: Genetic testing reported out on 11/06/2018 through the [facility] Common Hereditary Cancers panel found no pathogenic variants. The Common Hereditary Gene Panel offered by [facility] includes sequencing and/or deletion duplication testing of the following 48 genes: APC, ATM, AXIN2, BARD1, BMPR1A, BRCA1, BRCA2, BRIP1, CDH1, CDK4, CDKN2A (p14ARF), CDKN2A (p16INK4a), CHEK2,  CTNNA1, DICER1, EPCAM (Deletion/duplication testing only), GREM1 (promoter region deletion/duplication testing only), KIT, MEN1, MLH1, MSH2, MSH3, MSH6, MUTYH, NBN, NF1, NHTL1, PALB2,  PDGFRA, PMS2, POLD1, POLE, PTEN, RAD50, RAD51C, RAD51D, RNF43, SDHB, SDHC, SDHD, SMAD4, SMARCA4. STK11, TP53, TSC1, TSC2, and VHL.  The following genes were evaluated for sequence changes only: SDHA and HOXB13 c.251G>A variant only. The test report will be scanned into EPIC and located under the Molecular Pathology section of the Results Review tab.  A portion of the result report is included below for reference.     We discussed with Donna May that because current genetic testing is not perfect, it is possible there may be a gene mutation in one of these genes that current testing cannot detect, but that chance is small.  We also discussed, that there could be another gene that has not yet been discovered, or that we have not yet tested, that is responsible for the cancer diagnoses in the family. It is also possible there is a hereditary cause for the cancer in the family that Donna May did not inherit and therefore was not identified in her testing.  Therefore, it is important to remain in touch with cancer genetics in the future so that we can continue to offer Donna May the most up to date genetic testing.   Genetic testing did identify a variant of uncertain significance (VUS) was identified in the APC gene called c.1243G>A (p.Ala415Thr).  At this time, it is unknown if this variant is associated with increased cancer risk or if this is a normal finding, but most variants such as this get reclassified to being inconsequential. It should not be used to make medical management decisions. With time, we suspect the lab will determine the significance of this variant, if any. If we do learn more about it, we will try to contact Donna May to discuss it further. However, it is important to stay in touch with [place] periodically and keep the address and phone number up to date.  ADDITIONAL GENETIC TESTING: We discussed with Donna May that her genetic testing was fairly extensive.  If there are genes identified to  increase cancer risk that can be analyzed in the future, we would be happy to discuss and coordinate this testing at that time.    CANCER SCREENING RECOMMENDATIONS: Donna May test result is considered negative (normal).  This means that we have not identified a hereditary cause for her personal and family history of cancer at this time. Most cancers happen by chance and this negative test suggests that her cancer may fall into this category.    While reassuring, this does not definitively rule out a hereditary predisposition to cancer. It is still possible that there could be genetic mutations that are undetectable by current technology. There could be genetic mutations in genes that have not been tested or identified to increase cancer risk.  Therefore, it is recommended she continue to follow the cancer management and screening guidelines provided by her oncology and primary healthcare provider.   An individual's cancer risk and medical management are not determined by genetic test results alone. Overall cancer risk assessment incorporates additional factors, including personal medical history, family history, and any available genetic information that may result in a personalized plan for cancer prevention and surveillance  RECOMMENDATIONS FOR FAMILY MEMBERS:  Individuals in this family might be at some increased risk of developing cancer, over the general population risk, simply due to the family history of cancer.  We  recommended women in this family have a yearly mammogram beginning at age 77, or 91 years younger than the earliest onset of cancer, an annual clinical breast exam, and perform monthly breast self-exams. Women in this family should also have a gynecological exam as recommended by their primary provider. All family members should have a colonoscopy by age 31.  FOLLOW-UP: Lastly, we discussed with Donna May that cancer genetics is a rapidly advancing field and it is possible that new  genetic tests will be appropriate for her and/or her family members in the future. We encouraged her to remain in contact with cancer genetics on an annual basis so we can update her personal and family histories and let her know of advances in cancer genetics that may benefit this family.   Our contact number was provided. Donna May questions were answered to her satisfaction, and she knows she is welcome to call us at anytime with additional questions or concerns.   Clint Guy, MS Genetic Counselor [email or web address] Phone: [phone]

## 2018-11-09 ENCOUNTER — Other Ambulatory Visit: Payer: Self-pay

## 2018-11-09 ENCOUNTER — Encounter (HOSPITAL_COMMUNITY)
Admission: RE | Admit: 2018-11-09 | Discharge: 2018-11-09 | Disposition: A | Discharge status: Home / Self Care | Payer: Medicare Other | Source: Ambulatory Visit | Attending: General Surgery | Admitting: General Surgery

## 2018-11-09 ENCOUNTER — Encounter (HOSPITAL_COMMUNITY): Payer: Self-pay

## 2018-11-09 DIAGNOSIS — Z01812 Encounter for preprocedural laboratory examination: Secondary | ICD-10-CM | POA: Insufficient documentation

## 2018-11-09 HISTORY — DX: Anxiety disorder, unspecified: F41.9

## 2018-11-09 HISTORY — DX: Depression, unspecified: F32.A

## 2018-11-09 HISTORY — DX: Chronic kidney disease, unspecified: N18.9

## 2018-11-09 LAB — COMPREHENSIVE METABOLIC PANEL
ALT: 15 U/L (ref 0–44)
AST: 19 U/L (ref 15–41)
Albumin: 3.6 g/dL (ref 3.5–5.0)
Alkaline Phosphatase: 45 U/L (ref 38–126)
Anion gap: 9 (ref 5–15)
BUN: 19 mg/dL (ref 8–23)
CO2: 26 mmol/L (ref 22–32)
Calcium: 9.5 mg/dL (ref 8.9–10.3)
Chloride: 107 mmol/L (ref 98–111)
Creatinine, Ser: 1.4 mg/dL — ABNORMAL HIGH (ref 0.44–1.00)
GFR calc Af Amer: 41 mL/min — ABNORMAL LOW (ref >60)
GFR calc non Af Amer: 36 mL/min — ABNORMAL LOW (ref >60)
Glucose, Bld: 106 mg/dL — ABNORMAL HIGH (ref 70–99)
Potassium: 3.8 mmol/L (ref 3.5–5.1)
Sodium: 142 mmol/L (ref 135–145)
Total Bilirubin: 0.8 mg/dL (ref 0.3–1.2)
Total Protein: 6.5 g/dL (ref 6.5–8.1)

## 2018-11-09 LAB — CBC WITH DIFFERENTIAL/PLATELET
Abs Immature Granulocytes: 0.02 10*3/uL (ref 0.00–0.07)
Basophils Absolute: 0 10*3/uL (ref 0.0–0.1)
Basophils Relative: 0 %
Eosinophils Absolute: 0.1 10*3/uL (ref 0.0–0.5)
Eosinophils Relative: 2 %
HCT: 37.1 % (ref 36.0–46.0)
Hemoglobin: 11.6 g/dL — ABNORMAL LOW (ref 12.0–15.0)
Immature Granulocytes: 0 %
Lymphocytes Relative: 34 %
Lymphs Abs: 1.9 10*3/uL (ref 0.7–4.0)
MCH: 28.4 pg (ref 26.0–34.0)
MCHC: 31.3 g/dL (ref 30.0–36.0)
MCV: 90.9 fL (ref 80.0–100.0)
Monocytes Absolute: 0.5 10*3/uL (ref 0.1–1.0)
Monocytes Relative: 8 %
Neutro Abs: 3.1 10*3/uL (ref 1.7–7.7)
Neutrophils Relative %: 56 %
Platelets: 221 10*3/uL (ref 150–400)
RBC: 4.08 MIL/uL (ref 3.87–5.11)
RDW: 15.9 % — ABNORMAL HIGH (ref 11.5–15.5)
WBC: 5.6 10*3/uL (ref 4.0–10.5)
nRBC: 0 % (ref 0.0–0.2)

## 2018-11-09 LAB — PREPARE RBC (CROSSMATCH)

## 2018-11-09 LAB — PROTIME-INR
INR: 1.1 (ref 0.8–1.2)
Prothrombin Time: 13.7 seconds (ref 11.4–15.2)

## 2018-11-09 LAB — GLUCOSE, CAPILLARY: Glucose-Capillary: 108 mg/dL — ABNORMAL HIGH (ref 70–99)

## 2018-11-09 LAB — ABO/RH: ABO/RH(D): A POS

## 2018-11-09 LAB — HEMOGLOBIN A1C
Hgb A1c MFr Bld: 6.4 % — ABNORMAL HIGH (ref 4.8–5.6)
Mean Plasma Glucose: 136.98 mg/dL

## 2018-11-09 NOTE — Progress Notes (Signed)
[id number] [place], [place] [address] [place] [address] Phone: [phone] Fax: [phone]      Your procedure is scheduled on Thursday, 11/17/18.  Report to [facility] Main Entrance "A" at 7 A.M., and check in at the Admitting office.  Call this number if you have problems the morning of surgery:  [phone]  Call [phone] if you have any questions prior to your surgery date Monday-Friday 8am-4pm    Remember:  Do not eat after midnight the night before your surgery (Wednesday)  You may drink clear liquids until 6 AM the morning of your surgery (Thursday).   Clear liquids allowed are: Water, Non-Citrus Juices (without pulp), Carbonated Beverages, Clear Tea, Black Coffee Only, and Gatorade    Take these medicines the morning of surgery with A SIP OF WATER : acetaminophen (TYLENOL) if needed amLODipine (NORVASC) lansoprazole (PREVACID)  loratadine (CLARITIN) if needed metoprolol succinate (TOPROL-XL)    7 days prior to surgery STOP taking any Aspirin (unless otherwise instructed by your surgeon), Aleve, Naproxen, Ibuprofen, Motrin, Advil, Goody's, BC's, all herbal medications, fish oil, and all vitamins.    The Morning of Surgery  Do not wear jewelry, make-up or nail polish.  Do not wear lotions, powders, perfumes, or deodorant  Do not shave 48 hours prior to surgery.    Do not bring valuables to the hospital.  [facility] is not responsible for any belongings or valuables.  If you are a smoker, DO NOT Smoke 24 hours prior to surgery  IF you wear a CPAP at night please bring your mask, tubing, and machine the morning of surgery   Contacts, glasses, hearing aids, dentures or bridgework may not be worn into surgery.    Remember that you must have someone to transport you home after your surgery, and remain with you for 24 hours if you are discharged the same day.  Patients  discharged the day of surgery will not be allowed to drive home.   For patients admitted to the hospital, discharge time will be determined by your treatment team.   Special instructions:   [place]- Preparing For Surgery  Before surgery, you can play an important role. Because skin is not sterile, your skin needs to be as free of germs as possible. You can reduce the number of germs on your skin by washing with CHG (chlorahexidine gluconate) Soap before surgery.  CHG is an antiseptic cleaner which kills germs and bonds with the skin to continue killing germs even after washing.    Oral Hygiene is also important to reduce your risk of infection.  Remember - BRUSH YOUR TEETH THE MORNING OF SURGERY WITH YOUR REGULAR TOOTHPASTE  Please do not use if you have an allergy to CHG or antibacterial soaps. If your skin becomes reddened/irritated stop using the CHG.  Do not shave (including legs and underarms) for at least 48 hours prior to first CHG shower. It is OK to shave your face.  Please follow these instructions carefully.   1. Shower the [employer] BEFORE SURGERY (Wed) and the MORNING OF SURGERY (Thurs) with CHG Soap.   2. If you chose to wash your hair, wash your hair first as usual with your normal shampoo.  3. After you shampoo, rinse your hair and body thoroughly to remove the shampoo.  4. Use CHG as you would any other liquid soap. You can apply CHG directly to the skin and wash  gently with a scrungie or a clean washcloth.   5. Apply the CHG Soap to your body ONLY FROM THE NECK DOWN.  Do not use on open wounds or open sores. Avoid contact with your eyes, ears, mouth and genitals (private parts). Wash Face and genitals (private parts)  with your normal soap.   6. Wash thoroughly, paying special attention to the area where your surgery will be performed.  7. Thoroughly rinse your body with warm water from the neck down.  8. DO NOT shower/wash with your normal soap after using and  rinsing off the CHG Soap.  9. Pat yourself dry with a CLEAN TOWEL.  10. Wear CLEAN PAJAMAS to bed the night before surgery, wear comfortable clothes the morning of surgery  11. Place CLEAN SHEETS on your bed the night of your first shower and DO NOT SLEEP WITH PETS.    Day of Surgery:  Do not apply any deodorants/lotions. Please shower the morning of surgery with the CHG soap  Please wear clean clothes to the hospital/surgery center.   Remember to brush your teeth WITH YOUR REGULAR TOOTHPASTE.   Please read over the following fact sheets that you were given.

## 2018-11-09 NOTE — Progress Notes (Signed)
Patient denies shortness of breath, fever, cough and chest pain at PAT appointment  PCP - Dr Grier Mitts Oncology - Dr Krista Blue Feng/Dr Sarajane Jews Maginat Gastro- Dr [place] Cellar Endocrinology-Dr Elayne Snare Cardiologist - Dr [name] [name]  Chest x-ray - 04/25/18 EKG - 11/09/18 Stress Test -10/01/16  ECHO - 12/12/12 Cardiac Cath - Denies  Patient does not check her blood sugar at home.  Fasting Blood Sugar - unknown  Checks Blood Sugar ___0__ times a day  . Do not take oral diabetes medicines (pills) the morning of surgery. (Metformin and Januvia)  . If your blood sugar is less than 70 mg/dL, you will need to treat for low blood sugar: o Treat a low blood sugar (less than 70 mg/dL) with  cup of clear juice (cranberry or apple), o Recheck blood sugar in 15 minutes after treatment (to make sure it is greater than 70 mg/dL). If your blood sugar is not greater than 70 mg/dL on recheck, call [phone] for further instructions.  Aspirin Instructions: Follow your MD's instructions for aspirin prior to surgery.  If no instructions were given, please call the MD office to get these instructions. Patient agreed to call MD for these instructions.  ERAS: Clears til 6 am DOS, no drink.  Anesthesia review: Yes  7 days prior to surgery STOP taking any Aspirin (unless otherwise instructed by your surgeon), Aleve, Naproxen, Ibuprofen, Motrin, Advil, Goody's, BC's, all herbal medications, fish oil, and all vitamins.   Coronavirus Screening Have you or your daughter experienced the following symptoms:  Cough yes/no: No Fever (>100.1F)  yes/no: No Runny nose yes/no: No Sore throat yes/no: No Difficulty breathing/shortness of breath  yes/no: No  Have you or your daughter traveled in the last 14 days and where? yes/no: No

## 2018-11-10 LAB — AFP TUMOR MARKER: AFP, Serum, Tumor Marker: 6.9 ng/mL (ref 0.0–8.3)

## 2018-11-10 NOTE — Anesthesia Preprocedure Evaluation (Addendum)
Anesthesia Evaluation  Patient identified by MRN, date of birth, ID band Patient awake    Reviewed: Allergy & Precautions, NPO status , Patient's Chart, lab work & pertinent test results, reviewed documented beta blocker date and time   History of Anesthesia Complications Negative for: history of anesthetic complications  Airway Mallampati: II  TM Distance: >3 FB Neck ROM: Full    Dental no notable dental hx.    Pulmonary former smoker,    Pulmonary exam normal        Cardiovascular hypertension, Pt. on medications and Pt. on home beta blockers + CAD  Normal cardiovascular exam  TTE 2014: mild LVH, EF 44-96%, RV systolic pressure increased, PA peak pressure 21mm Hg   Neuro/Psych Anxiety Depression negative neurological ROS     GI/Hepatic hiatal hernia, GERD  Controlled and Medicated,Liver ca   Endo/Other  diabetes, Well Controlled, Type 2  Renal/GU Renal InsufficiencyRenal disease (Cr 1.4)     Musculoskeletal  (+) Arthritis ,   Abdominal   Peds  Hematology  (+) anemia , Hgb 11.6, plt 221   Anesthesia Other Findings Day of surgery medications reviewed with the patient.  Hx of breast ca  Reproductive/Obstetrics                          Anesthesia Physical Anesthesia Plan  ASA: III  Anesthesia Plan: General   Post-op Pain Management: GA combined w/ Regional for post-op pain   Induction: Intravenous  PONV Risk Score and Plan: 4 or greater and Ondansetron, Dexamethasone, Treatment may vary due to age or medical condition and Midazolam  Airway Management Planned: Oral ETT  Additional Equipment: Arterial line, CVP and Ultrasound Guidance Line Placement  Intra-op Plan:   Post-operative Plan: Possible Post-op intubation/ventilation  Informed Consent: I have reviewed the patients History and Physical, chart, labs and discussed the procedure including the risks, benefits and  alternatives for the proposed anesthesia with the patient or authorized representative who has indicated his/her understanding and acceptance.     Dental advisory given  Plan Discussed with: CRNA  Anesthesia Plan Comments: (PAT note written 11/10/2018 by Myra Gianotti, PA-C. )      Anesthesia Quick Evaluation

## 2018-11-10 NOTE — Progress Notes (Signed)
Anesthesia Chart Review:  Case: [id number] Date/Time: 11/17/18 0845   Procedures:      DIAGNOSTIC LARYNGOSCOPY (N/A ) - GENERAL AND EPIDURAL     RIGHT HEPATIC LOBECTOMY (Right ) - GENERAL AND EPIDURAL   Anesthesia type: General   Pre-op diagnosis: LIVER CANCER   Location: [facility] OR ROOM 02 / [facility]   Surgeon: [name], MD      DISCUSSION: Patient is a 78 year old female scheduled for the above procedure.  History includes former smoker (quit 1990), liver cancer (09/13/18 biopsy: adenocarcinoma, primary cholangiocarcinoma favored), breast cancer (diagnosed 1992, s/p right mastectomy with LN dissection, TRAM flap), RUE lymphedema, HTN, DM2, GERD, hiatal hernia, murmur (mild TR, trivial MR/PR 11/2012 echo), CKD stage III, syncope, anxiety, depression.    EKG showed sinus bradycardia. Non-ischemic stress test in 09/2016. Denied chest pain, SOB at PAT RN visit. Dr. Barry Dienes notes that patient with a "very high functional status." DM well controlled with A1c 6.4%. Cr stable at 1.40 (has pending nephrology referral from 09/14/18). She is not on any anticoagulation therapy and no reported back surgeries. No significant valvular disease by echo in 2014 and no murmur by 09/2017 assessment by Dr. Sallyanne Kuster. Based on known information, I did not identify any contraindications for epidural. Anesthesiologist will evaluate on the day of surgery.  Presurgical COVID-19 test is scheduled for 11/14/18.   VS: BP (!) 163/58   Pulse 65   Temp (!) 36.1 C   Resp 20   Ht 5\' 5"  (1.651 m)   Wt 68 kg   LMP  (LMP Unknown)   SpO2 98%   BMI 24.94 kg/m    PROVIDERS: [name], MD is PCP. Last visit 09/14/18. Discussed plans for referral to nephrology given Cr 1.78, GFR 31 09/03/18. 3-4 month follow-up recommended. Carmie End, MD is HEM-ONC (last visit 10/06/18) and Lurline Del, MD (breast cancer) - Elayne Snare, MD is endocrinologist. Last visit 09/01/18. Gateway Cellar, MD is GI Sallyanne Kuster, Mihai, MD is  cardiologist. Last visit 10/07/17 for follow-up HTN, HLD, coronary calcifications (with normal perfusion scan 09/2016), possible PAD symptoms. ABI's ordered which were normal.   LABS: Labs reviewed: Acceptable for surgery. Cr 1.40, but consistent with previous results (Cr 1.40-1.78 since 09/01/18). (all labs ordered are listed, but only abnormal results are displayed)  Labs Reviewed  GLUCOSE, CAPILLARY - Abnormal; Notable for the following components:      Result Value   Glucose-Capillary 108 (*)    All other components within normal limits  CBC WITH DIFFERENTIAL/PLATELET - Abnormal; Notable for the following components:   Hemoglobin 11.6 (*)    RDW 15.9 (*)    All other components within normal limits  COMPREHENSIVE METABOLIC PANEL - Abnormal; Notable for the following components:   Glucose, Bld 106 (*)    Creatinine, Ser 1.40 (*)    GFR calc non Af Amer 36 (*)    GFR calc Af Amer 41 (*)    All other components within normal limits  HEMOGLOBIN A1C - Abnormal; Notable for the following components:   Hgb A1c MFr Bld 6.4 (*)    All other components within normal limits  PROTIME-INR  AFP TUMOR MARKER  PREPARE RBC (CROSSMATCH)  TYPE AND SCREEN  ABO/RH     IMAGES: PET Scan 09/29/18: IMPRESSION: 1. Hypermetabolic mass in the RIGHT hepatic lobe consistent with biopsy proven adenocarcinoma. No additional liver metastasis. 2. No evidence of local breast cancer recurrence in the RIGHT breast or RIGHT axilla. 3. Mild  bilateral hypermetabolic adrenal glands is favored benign hyperplasia. 4. No evidence of additional metastatic disease on skull base to thigh FDG PET scan.  CT Chest 09/07/18: IMPRESSION: 1. New, enhancing mass involving segment 4 of the liver and fundus of gallbladder is concerning for malignancy. This may represent either metastatic disease from breast cancer or neoplasm primary to the liver or hepatic biliary tree. Further evaluation with contrast enhanced CT of the  abdomen and pelvis is recommended. 2. No findings to suggest metastatic disease within the chest. 3.  Aortic Atherosclerosis (ICD10-I70.0). 4. Coronary artery calcifications.   EKG: 11/09/18:  Sinus cardiac 56 bpm Otherwise normal ECG Sinus bradycardia, new  Inferolateral T wave changes have improved [when compared to 12/17/15 tracing; low risk stress test 10/01/16] Confirmed by Shelva Majestic [phone]) on 11/09/2018 6:40:00 PM   CV: Nuclear stress test 10/01/16: Study Highlights  The left ventricular ejection fraction is normal (55-65%).  Nuclear stress EF: 57%.  There was no ST segment deviation noted during stress.  No T wave inversion was noted during stress.  The study is normal.  This is a low risk study.  Echo 12/12/12: Study Conclusions  - Left ventricle: The cavity size was mildly reduced. Wall  thickness was increased in a pattern of mild LVH. There  was mild concentric hypertrophy. Systolic function was  normal. The estimated ejection fraction was in the range  of 55% to 60%. Wall motion was normal; there were no  regional wall motion abnormalities. Left ventricular  diastolic function parameters were normal.  - Mitral valve: Calcified annulus. Mildly thickened leaflets.    Trivial regurgitation. - Right ventricle: Systolic pressure was increased.  - Pulmonic valve: Trivial regurgitation. - Tricuspid valve: Mild regurgitation. - Atrial septum: No defect or patent foramen ovale was  identified.  - Pulmonary arteries: PA peak pressure: 34mm Hg (S).  Impressions:  - The right ventricular systolic pressure was increased  consistent with mild pulmonary hypertension.    Past Medical History:  Diagnosis Date  . Anemia   . Anxiety   . Arthritis   . Breast cancer ([place]) 1992  . Cataract    Bilateral eyes - surgery to remove  . Chronic kidney disease    CKD stage 3  . Depression   . Diabetes mellitus    type 2  . Duodenitis 01/18/2002  .  Fainting spell   . Family history of breast cancer   . Family history of prostate cancer   . GERD (gastroesophageal reflux disease)   . Heart murmur    never has caused any problems  . Hiatal hernia 08/08/2008, 01/18/2002  . History of pneumonia    x 2  . Hyperlipidemia   . Hypertension   . Liver cancer ([place]) 08/2018  . Lymphedema 2017   Right arm  . Pneumonia    x 2  . UTI (lower urinary tract infection)     Past Surgical History:  Procedure Laterality Date  . AXILLARY SURGERY     cyst removal, right  . COLONOSCOPY  09/23/2018   Dr. Havery Moros - polyps  . EYE SURGERY Bilateral    cataracts to remove  . LIVER BIOPSY  08/2018   Dr. Lindwood Coke  . MASTECTOMY  1992   right, with flap  . NM MYOCAR PERF WALL MOTION  06/11/2009   Protocol:[place], post stress EF58%, EKG negative for ischemia, low risk  . RECONSTRUCTION BREAST W/ TRAM FLAP Right   . TONSILLECTOMY    . TRANSTHORACIC ECHOCARDIOGRAM  12/24/2009  LVEF =>55%, normal study  . UPPER GI ENDOSCOPY      MEDICATIONS: . acetaminophen (TYLENOL) 650 MG CR tablet  . amLODipine (NORVASC) 10 MG tablet  . aspirin 81 MG tablet  . CALCIUM-MAGNESIUM-VITAMIN D PO  . Cholecalciferol (VITAMIN D3) 2000 UNITS TABS  . Echinacea 400 MG CAPS  . glucose blood (ACCU-CHEK AVIVA) test strip  . hydrochlorothiazide (HYDRODIURIL) 25 MG tablet  . ketoconazole (NIZORAL) 2 % cream  . lansoprazole (PREVACID) 15 MG capsule  . loratadine (CLARITIN) 10 MG tablet  . metFORMIN (GLUCOPHAGE-XR) 500 MG 24 hr tablet  . metoprolol succinate (TOPROL-XL) 100 MG 24 hr tablet  . Multiple Vitamins-Minerals (CENTRUM SILVER ULTRA WOMENS) TABS  . Omega 3 1000 MG CAPS  . promethazine-dextromethorphan (PROMETHAZINE-DM) 6.25-15 MG/5ML syrup  . raloxifene (EVISTA) 60 MG tablet  . rosuvastatin (CRESTOR) 20 MG tablet  . sitaGLIPtin (JANUVIA) 100 MG tablet  . triamcinolone cream (KENALOG) 0.1 %  . valsartan (DIOVAN) 320 MG tablet   . triamcinolone acetonide  (KENALOG) 10 MG/ML injection 10 mg  Patient advised to contact surgeon regarding perioperative ASA instructions.   Myra Gianotti, PA-C Surgical Short Stay/Anesthesiology [facility] Phone [phone] [facility] Phone [phone] 11/10/2018 4:34 PM

## 2018-11-12 DIAGNOSIS — Z853 Personal history of malignant neoplasm of breast: Secondary | ICD-10-CM | POA: Diagnosis not present

## 2018-11-12 DIAGNOSIS — Z8505 Personal history of malignant neoplasm of liver: Secondary | ICD-10-CM | POA: Diagnosis not present

## 2018-11-14 ENCOUNTER — Other Ambulatory Visit (HOSPITAL_COMMUNITY)
Admission: RE | Admit: 2018-11-14 | Discharge: 2018-11-14 | Disposition: A | Discharge status: Home / Self Care | Payer: Medicare Other | Source: Ambulatory Visit | Attending: General Surgery | Admitting: General Surgery

## 2018-11-14 DIAGNOSIS — I10 Essential (primary) hypertension: Secondary | ICD-10-CM | POA: Diagnosis not present

## 2018-11-14 DIAGNOSIS — Z87891 Personal history of nicotine dependence: Secondary | ICD-10-CM | POA: Diagnosis not present

## 2018-11-14 DIAGNOSIS — Z7984 Long term (current) use of oral hypoglycemic drugs: Secondary | ICD-10-CM | POA: Diagnosis not present

## 2018-11-14 DIAGNOSIS — D631 Anemia in chronic kidney disease: Secondary | ICD-10-CM | POA: Diagnosis not present

## 2018-11-14 DIAGNOSIS — F329 Major depressive disorder, single episode, unspecified: Secondary | ICD-10-CM | POA: Diagnosis not present

## 2018-11-14 DIAGNOSIS — C7989 Secondary malignant neoplasm of other specified sites: Secondary | ICD-10-CM | POA: Diagnosis not present

## 2018-11-14 DIAGNOSIS — Z853 Personal history of malignant neoplasm of breast: Secondary | ICD-10-CM | POA: Diagnosis not present

## 2018-11-14 DIAGNOSIS — M1990 Unspecified osteoarthritis, unspecified site: Secondary | ICD-10-CM | POA: Diagnosis not present

## 2018-11-14 DIAGNOSIS — F419 Anxiety disorder, unspecified: Secondary | ICD-10-CM | POA: Diagnosis not present

## 2018-11-14 DIAGNOSIS — Z79899 Other long term (current) drug therapy: Secondary | ICD-10-CM | POA: Diagnosis not present

## 2018-11-14 DIAGNOSIS — E119 Type 2 diabetes mellitus without complications: Secondary | ICD-10-CM | POA: Diagnosis not present

## 2018-11-14 DIAGNOSIS — Z20828 Contact with and (suspected) exposure to other viral communicable diseases: Secondary | ICD-10-CM | POA: Diagnosis not present

## 2018-11-14 DIAGNOSIS — Z9011 Acquired absence of right breast and nipple: Secondary | ICD-10-CM | POA: Diagnosis not present

## 2018-11-14 DIAGNOSIS — Z8042 Family history of malignant neoplasm of prostate: Secondary | ICD-10-CM | POA: Diagnosis not present

## 2018-11-14 DIAGNOSIS — Z791 Long term (current) use of non-steroidal anti-inflammatories (NSAID): Secondary | ICD-10-CM | POA: Diagnosis not present

## 2018-11-14 DIAGNOSIS — K219 Gastro-esophageal reflux disease without esophagitis: Secondary | ICD-10-CM | POA: Diagnosis not present

## 2018-11-14 DIAGNOSIS — I2510 Atherosclerotic heart disease of native coronary artery without angina pectoris: Secondary | ICD-10-CM | POA: Diagnosis not present

## 2018-11-14 DIAGNOSIS — Z833 Family history of diabetes mellitus: Secondary | ICD-10-CM | POA: Diagnosis not present

## 2018-11-14 DIAGNOSIS — Z8261 Family history of arthritis: Secondary | ICD-10-CM | POA: Diagnosis not present

## 2018-11-14 DIAGNOSIS — Z8249 Family history of ischemic heart disease and other diseases of the circulatory system: Secondary | ICD-10-CM | POA: Diagnosis not present

## 2018-11-14 DIAGNOSIS — C227 Other specified carcinomas of liver: Secondary | ICD-10-CM | POA: Diagnosis not present

## 2018-11-14 LAB — SARS CORONAVIRUS 2 (TAT 6-24 HRS): SARS Coronavirus 2: NEGATIVE

## 2018-11-14 NOTE — H&P (Signed)
Donna May Location: [facility] Surgery Patient #: [id number] DOB: June 25, 1940 Single / Language: Donna May / Race: Black or African American Female   History of Present Illness  The patient is a 78 year old female who presents for a follow-up for Liver mass. Patient is a 78 year old female referred by Dr. Burr Medico for a new diagnosis of an adenocarcinoma in the liver. The patient has a history of right breast cancer in 1992. She had a mastectomy and TRAM flap at that time. It sounds as though she had a sentinel node biopsy. She did not receive radiation or chemotherapy. She had a spot that concerned her in her right axilla. When she went to see Mendel Ryder at the cancer center, a chest CT was ordered since her diagnostic mammogram and ultrasound have been negative. The CT did not show any findings in the axilla or chest, but did show a mass in the liver. A biopsy was done which showed adenocarcinoma. Markers were not consistent with breast cancer. PET scan was performed which showed no other evidence of disease.  CT chest 09/07/2018  IMPRESSION: 1. New, enhancing mass involving segment 4 of the liver and fundus of gallbladder is concerning for malignancy. This may represent either metastatic disease from breast cancer or neoplasm primary to the liver or hepatic biliary tree. Further evaluation with contrast enhanced CT of the abdomen and pelvis is recommended. 2. No findings to suggest metastatic disease within the chest. 3. Aortic Atherosclerosis (ICD10-I70.0). 4. Coronary artery calcifications.  PET 09/29/2018 IMPRESSION: 1. Hypermetabolic mass in the RIGHT hepatic lobe consistent with biopsy proven adenocarcinoma. No additional liver metastasis. 2. No evidence of local breast cancer recurrence in the RIGHT breast or RIGHT axilla. 3. Mild bilateral hypermetabolic adrenal glands is favored benign hyperplasia. 4. No evidence of additional metastatic disease on skull base  to thigh FDG PET scan.  Labs 09/22/2018 CMET wtih Cr 1.48 (1.2-1.78) Nl LFTs Nl CBC Normal tumor markers (CA 19-9, CEA, and CA 27.29)  EGD, colonoscopy armbruster 09/23/2018- no evidence of tumors.      Past Surgical History Anal Fissure Repair  Breast Biopsy  Right. Mastectomy  Right. Tonsillectomy   Diagnostic Studies History  Pap Smear  1-5 years ago  Allergies No Known Drug Allergies   Medication History  amLODIPine Besylate (10MG  Tablet, Oral) Active. [facility] Tablet ER, Oral) Active. Meloxicam (7.5MG  Tablet, Oral) Active. Lansoprazole (15MG  Capsule DR, Oral) Active. Valsartan (320MG  Tablet, Oral) Active. Raloxifene HCl (60MG  Tablet, Oral) Active. Rosuvastatin Calcium (20MG  Tablet, Oral) Active. Metoprolol Succinate ER (100MG  Tablet ER 24HR, Oral) Active. Januvia (100MG  Tablet, Oral) Active. metFORMIN HCl ER (500MG  Tablet ER 24HR, Oral) Active. Medications Reconciled  Social History Alcohol use  Occasional alcohol use. Caffeine use  Tea. No drug use  Tobacco use  Former smoker.  Family History  Arthritis  Father. Diabetes Mellitus  Brother. Heart disease in female family member before age 62  Heart disease in female family member before age 42  Prostate Cancer  Brother.  Pregnancy / Birth History  Age of menopause  [age]  Other Problems Arthritis  Back Pain  Diabetes Mellitus  Gastroesophageal Reflux Disease  Heart murmur     Review of Systems  General Present- Weight Loss. Not Present- Appetite Loss, Chills, Fatigue, Fever, Night Sweats and Weight Gain. Skin Not Present- Change in Wart/Mole, Dryness, Hives, Jaundice, New Lesions, Non-Healing Wounds, Rash and Ulcer. HEENT Present- Wears glasses/contact lenses. Not Present- Earache, Hearing Loss, Hoarseness, Nose Bleed, Oral Ulcers, Ringing in  the Ears, Seasonal Allergies, Sinus Pain, Sore Throat, Visual Disturbances and Yellow Eyes. Respiratory Present-  Wheezing. Not Present- Bloody sputum, Chronic Cough, Difficulty Breathing and Snoring. Musculoskeletal Present- Joint Pain. Not Present- Back Pain, Joint Stiffness, Muscle Pain, Muscle Weakness and Swelling of Extremities. Hematology Not Present- Blood Thinners, Easy Bruising, Excessive bleeding, Gland problems, HIV and Persistent Infections.  Vitals  Weight: 153.6 lb Height: 65in Body Surface Area: 1.77 m Body Mass Index: 25.56 kg/m  Temp.: 75F (Temporal)  Pulse: 93 (Regular)  BP: 142/68(Sitting, Left Arm, Standard)       Physical Exam General Mental Status-Alert. General Appearance-Consistent with stated age. Hydration-Well hydrated. Voice-Normal.  Head and Neck Head-normocephalic, atraumatic with no lesions or palpable masses. Trachea-midline. Thyroid Gland Characteristics - normal size and consistency.  Eye Eyeball - Bilateral-Extraocular movements intact. Sclera/Conjunctiva - Bilateral-No scleral icterus.  Chest and Lung Exam Chest and lung exam reveals -quiet, even and easy respiratory effort with no use of accessory muscles and on auscultation, normal breath sounds, no adventitious sounds and normal vocal resonance. Inspection Chest Wall - Normal. Back - normal.  Cardiovascular Cardiovascular examination reveals -normal heart sounds, regular rate and rhythm with no murmurs and normal pedal pulses bilaterally.  Abdomen Inspection Inspection of the abdomen reveals - No Hernias. Palpation/Percussion Palpation and Percussion of the abdomen reveal - Soft, Non Tender, No Rebound tenderness, No Rigidity (guarding) and No hepatosplenomegaly. Auscultation Auscultation of the abdomen reveals - Bowel sounds normal. Note: abdominal incisions c/w rotational tram flap. no palpable masses.   Neurologic Neurologic evaluation reveals -alert and oriented x 3 with no impairment of recent or remote memory. Mental  Status-Normal.  Musculoskeletal Global Assessment -Note: no gross deformities.  Normal Exam - Left-Upper Extremity Strength Normal and Lower Extremity Strength Normal. Normal Exam - Right-Upper Extremity Strength Normal and Lower Extremity Strength Normal.  Lymphatic Head & Neck  General Head & Neck Lymphatics: Bilateral - Description - Normal. Axillary  General Axillary Region: Bilateral - Description - Normal. Tenderness - Non Tender. Femoral & Inguinal  Generalized Femoral & Inguinal Lymphatics: Bilateral - Description - No Generalized lymphadenopathy.    Assessment & Plan  MALIGNANT NEOPLASM OF LIVER, UNSPECIFIED LIVER MALIGNANCY TYPE (C22.9) Impression: It is unclear if this tumor represents gallbladder cancer, cholangiocarcinoma, or hepatocellular cancer. The patient is 41, but has a very high functional status. She looks younger than stated age. I do think it is reasonable to offer her surgery if she desires. I had a long discussion with her regarding what surgery would entail. We would need to be very careful not to damage her TRAM flap. I think the center portion of her incision may need to be left of midline just a little bit in order to avoid damaging the site where the TRAM is flipped up forward from the superior epigastric artery.  The surgery was discussed with the patient with diagrams of anatomy. I reviewed the rationale for surgery, possible alternative options, possibility of having to abort the procedure, hospital course, post op restrictions, possible post op complications, possible need for post hospital stay at a nursing home or rehab, and possible death.  The complications can include: This is a very extensive operation and includes complications listed below: Bleeding Infection and possible wound complications such as hernia Damage to adjacent structures Leak of bile from the surface of the liver Possible need for other procedures, such as abscess  drains in radiology or endoscopy. Possible prolonged hospital stay MOST PATIENTS' ENERGY LEVEL IS NOT BACK TO NORMAL  FOR AT LEAST 4-6 MONTHS. OLDER PATIENTS MAY FEEL WEAK FOR LONGER PERIODS OF TIME. Difficulty with eating or post operative nausea (around 30%) Possible early recurrence of cancer Possible complications of your medical problems such as heart disease or arrhythmias. Death (less than 2%)  The patient would like to think about this and discuss with her family. She requested 1 to 2 weeks to consider surgery. I discussed that for these types of tumors, there are other potential treatments, but they are much less effective from a long-term survival standpoint.  Current Plans Pt Education - flb hepatectomy: discussed with patient and provided information.   Signed by [name], MD

## 2018-11-14 NOTE — H&P (View-Only) (Signed)
Donna May Location: [facility] Patient #: [id number] DOB: 1940/11/13 Single / Language: Cleophus Molt / Race: Black or African American Female   History of Present Illness  The patient is a 78 year old female who presents for a follow-up for Liver mass. Patient is a 78 year old female referred by Dr. Burr Medico for a new diagnosis of an adenocarcinoma in the liver. The patient has a history of right breast cancer in 1992. She had a mastectomy and TRAM flap at that time. It sounds as though she had a sentinel node biopsy. She did not receive radiation or chemotherapy. She had a spot that concerned her in her right axilla. When she went to see Mendel Ryder at the cancer center, a chest CT was ordered since her diagnostic mammogram and ultrasound have been negative. The CT did not show any findings in the axilla or chest, but did show a mass in the liver. A biopsy was done which showed adenocarcinoma. Markers were not consistent with breast cancer. PET scan was performed which showed no other evidence of disease.  CT chest 09/07/2018  IMPRESSION: 1. New, enhancing mass involving segment 4 of the liver and fundus of gallbladder is concerning for malignancy. This may represent either metastatic disease from breast cancer or neoplasm primary to the liver or hepatic biliary tree. Further evaluation with contrast enhanced CT of the abdomen and pelvis is recommended. 2. No findings to suggest metastatic disease within the chest. 3. Aortic Atherosclerosis (ICD10-I70.0). 4. Coronary artery calcifications.  PET 09/29/2018 IMPRESSION: 1. Hypermetabolic mass in the RIGHT hepatic lobe consistent with biopsy proven adenocarcinoma. No additional liver metastasis. 2. No evidence of local breast cancer recurrence in the RIGHT breast or RIGHT axilla. 3. Mild bilateral hypermetabolic adrenal glands is favored benign hyperplasia. 4. No evidence of additional metastatic disease on skull base  to thigh FDG PET scan.  Labs 09/22/2018 CMET wtih Cr 1.48 (1.2-1.78) Nl LFTs Nl CBC Normal tumor markers (CA 19-9, CEA, and CA 27.29)  EGD, colonoscopy armbruster 09/23/2018- no evidence of tumors.      Past Surgical History Anal Fissure Repair  Breast Biopsy  Right. Mastectomy  Right. Tonsillectomy   Diagnostic Studies History  Pap Smear  1-5 years ago  Allergies No Known Drug Allergies   Medication History  amLODIPine Besylate (10MG  Tablet, Oral) Active. Klor-Con M20 [facility] Tablet ER, Oral) Active. Meloxicam (7.5MG  Tablet, Oral) Active. Lansoprazole (15MG  Capsule DR, Oral) Active. Valsartan (320MG  Tablet, Oral) Active. Raloxifene HCl (60MG  Tablet, Oral) Active. Rosuvastatin Calcium (20MG  Tablet, Oral) Active. Metoprolol Succinate ER (100MG  Tablet ER 24HR, Oral) Active. Januvia (100MG  Tablet, Oral) Active. metFORMIN HCl ER (500MG  Tablet ER 24HR, Oral) Active. Medications Reconciled  Social History Alcohol use  Occasional alcohol use. Caffeine use  Tea. No drug use  Tobacco use  Former smoker.  Family History  Arthritis  Father. Diabetes Mellitus  Brother. Heart disease in female family member before age 71  Heart disease in female family member before age 54  Prostate Cancer  Brother.  Pregnancy / Birth History  Age of menopause  [age]  Other Problems Arthritis  Back Pain  Diabetes Mellitus  Gastroesophageal Reflux Disease  Heart murmur     Review of Systems  General Present- Weight Loss. Not Present- Appetite Loss, Chills, Fatigue, Fever, Night Sweats and Weight Gain. Skin Not Present- Change in Wart/Mole, Dryness, Hives, Jaundice, New Lesions, Non-Healing Wounds, Rash and Ulcer. HEENT Present- Wears glasses/contact lenses. Not Present- Earache, Hearing Loss, Hoarseness, Nose Bleed, Oral Ulcers, Ringing in  the Ears, Seasonal Allergies, Sinus Pain, Sore Throat, Visual Disturbances and Yellow Eyes. Respiratory Present-  Wheezing. Not Present- Bloody sputum, Chronic Cough, Difficulty Breathing and Snoring. Musculoskeletal Present- Joint Pain. Not Present- Back Pain, Joint Stiffness, Muscle Pain, Muscle Weakness and Swelling of Extremities. Hematology Not Present- Blood Thinners, Easy Bruising, Excessive bleeding, Gland problems, HIV and Persistent Infections.  Vitals  Weight: 153.6 lb Height: 65in Body Surface Area: 1.77 m Body Mass Index: 25.56 kg/m  Temp.: 26F (Temporal)  Pulse: 93 (Regular)  BP: 142/68(Sitting, Left Arm, Standard)       Physical Exam General Mental Status-Alert. General Appearance-Consistent with stated age. Hydration-Well hydrated. Voice-Normal.  Head and Neck Head-normocephalic, atraumatic with no lesions or palpable masses. Trachea-midline. Thyroid Gland Characteristics - normal size and consistency.  Eye Eyeball - Bilateral-Extraocular movements intact. Sclera/Conjunctiva - Bilateral-No scleral icterus.  Chest and Lung Exam Chest and lung exam reveals -quiet, even and easy respiratory effort with no use of accessory muscles and on auscultation, normal breath sounds, no adventitious sounds and normal vocal resonance. Inspection Chest Wall - Normal. Back - normal.  Cardiovascular Cardiovascular examination reveals -normal heart sounds, regular rate and rhythm with no murmurs and normal pedal pulses bilaterally.  Abdomen Inspection Inspection of the abdomen reveals - No Hernias. Palpation/Percussion Palpation and Percussion of the abdomen reveal - Soft, Non Tender, No Rebound tenderness, No Rigidity (guarding) and No hepatosplenomegaly. Auscultation Auscultation of the abdomen reveals - Bowel sounds normal. Note: abdominal incisions c/w rotational tram flap. no palpable masses.   Neurologic Neurologic evaluation reveals -alert and oriented x 3 with no impairment of recent or remote memory. Mental  Status-Normal.  Musculoskeletal Global Assessment -Note: no gross deformities.  Normal Exam - Left-Upper Extremity Strength Normal and Lower Extremity Strength Normal. Normal Exam - Right-Upper Extremity Strength Normal and Lower Extremity Strength Normal.  Lymphatic Head & Neck  General Head & Neck Lymphatics: Bilateral - Description - Normal. Axillary  General Axillary Region: Bilateral - Description - Normal. Tenderness - Non Tender. Femoral & Inguinal  Generalized Femoral & Inguinal Lymphatics: Bilateral - Description - No Generalized lymphadenopathy.    Assessment & Plan  MALIGNANT NEOPLASM OF LIVER, UNSPECIFIED LIVER MALIGNANCY TYPE (C22.9) Impression: It is unclear if this tumor represents gallbladder cancer, cholangiocarcinoma, or hepatocellular cancer. The patient is 46, but has a very high functional status. She looks younger than stated age. I do think it is reasonable to offer her surgery if she desires. I had a long discussion with her regarding what surgery would entail. We would need to be very careful not to damage her TRAM flap. I think the center portion of her incision may need to be left of midline just a little bit in order to avoid damaging the site where the TRAM is flipped up forward from the superior epigastric artery.  The surgery was discussed with the patient with diagrams of anatomy. I reviewed the rationale for surgery, possible alternative options, possibility of having to abort the procedure, hospital course, post op restrictions, possible post op complications, possible need for post hospital stay at a nursing home or rehab, and possible death.  The complications can include: This is a very extensive operation and includes complications listed below: Bleeding Infection and possible wound complications such as hernia Damage to adjacent structures Leak of bile from the surface of the liver Possible need for other procedures, such as abscess  drains in radiology or endoscopy. Possible prolonged hospital stay MOST PATIENTS' ENERGY LEVEL IS NOT BACK TO NORMAL  FOR AT LEAST 4-6 MONTHS. OLDER PATIENTS MAY FEEL WEAK FOR LONGER PERIODS OF TIME. Difficulty with eating or post operative nausea (around 30%) Possible early recurrence of cancer Possible complications of your medical problems such as heart disease or arrhythmias. Death (less than 2%)  The patient would like to think about this and discuss with her family. She requested 1 to 2 weeks to consider surgery. I discussed that for these types of tumors, there are other potential treatments, but they are much less effective from a long-term survival standpoint.  Current Plans Pt Education - flb hepatectomy: discussed with patient and provided information.   Signed by [name], MD

## 2018-11-17 ENCOUNTER — Encounter (HOSPITAL_COMMUNITY)
Admission: RE | Disposition: A | Discharge status: Home / Self Care | Payer: Self-pay | Source: Ambulatory Visit | Attending: General Surgery

## 2018-11-17 ENCOUNTER — Other Ambulatory Visit: Payer: Self-pay

## 2018-11-17 ENCOUNTER — Inpatient Hospital Stay (HOSPITAL_COMMUNITY)
Admission: RE | Admit: 2018-11-17 | Discharge: 2018-11-17 | DRG: 421 | Disposition: A | Discharge status: Home / Self Care | Payer: Medicare Other | Attending: General Surgery | Admitting: General Surgery

## 2018-11-17 ENCOUNTER — Inpatient Hospital Stay (HOSPITAL_COMMUNITY): Payer: Medicare Other | Admitting: Vascular Surgery

## 2018-11-17 ENCOUNTER — Encounter (HOSPITAL_COMMUNITY): Payer: Self-pay | Admitting: *Deleted

## 2018-11-17 ENCOUNTER — Inpatient Hospital Stay (HOSPITAL_COMMUNITY): Payer: Medicare Other | Admitting: Anesthesiology

## 2018-11-17 ENCOUNTER — Telehealth: Payer: Self-pay | Admitting: Hematology

## 2018-11-17 DIAGNOSIS — K219 Gastro-esophageal reflux disease without esophagitis: Secondary | ICD-10-CM | POA: Diagnosis present

## 2018-11-17 DIAGNOSIS — Z8042 Family history of malignant neoplasm of prostate: Secondary | ICD-10-CM

## 2018-11-17 DIAGNOSIS — Z791 Long term (current) use of non-steroidal anti-inflammatories (NSAID): Secondary | ICD-10-CM | POA: Diagnosis not present

## 2018-11-17 DIAGNOSIS — C228 Malignant neoplasm of liver, primary, unspecified as to type: Secondary | ICD-10-CM | POA: Diagnosis not present

## 2018-11-17 DIAGNOSIS — C229 Malignant neoplasm of liver, not specified as primary or secondary: Secondary | ICD-10-CM | POA: Diagnosis not present

## 2018-11-17 DIAGNOSIS — Z7984 Long term (current) use of oral hypoglycemic drugs: Secondary | ICD-10-CM

## 2018-11-17 DIAGNOSIS — Z8249 Family history of ischemic heart disease and other diseases of the circulatory system: Secondary | ICD-10-CM | POA: Diagnosis not present

## 2018-11-17 DIAGNOSIS — C227 Other specified carcinomas of liver: Principal | ICD-10-CM | POA: Diagnosis present

## 2018-11-17 DIAGNOSIS — I10 Essential (primary) hypertension: Secondary | ICD-10-CM | POA: Diagnosis present

## 2018-11-17 DIAGNOSIS — Z87891 Personal history of nicotine dependence: Secondary | ICD-10-CM

## 2018-11-17 DIAGNOSIS — C7989 Secondary malignant neoplasm of other specified sites: Secondary | ICD-10-CM | POA: Diagnosis present

## 2018-11-17 DIAGNOSIS — Z20828 Contact with and (suspected) exposure to other viral communicable diseases: Secondary | ICD-10-CM | POA: Diagnosis not present

## 2018-11-17 DIAGNOSIS — Z8261 Family history of arthritis: Secondary | ICD-10-CM

## 2018-11-17 DIAGNOSIS — Z853 Personal history of malignant neoplasm of breast: Secondary | ICD-10-CM | POA: Diagnosis not present

## 2018-11-17 DIAGNOSIS — C772 Secondary and unspecified malignant neoplasm of intra-abdominal lymph nodes: Secondary | ICD-10-CM | POA: Diagnosis not present

## 2018-11-17 DIAGNOSIS — K3189 Other diseases of stomach and duodenum: Secondary | ICD-10-CM | POA: Diagnosis not present

## 2018-11-17 DIAGNOSIS — Z833 Family history of diabetes mellitus: Secondary | ICD-10-CM

## 2018-11-17 DIAGNOSIS — G8918 Other acute postprocedural pain: Secondary | ICD-10-CM | POA: Diagnosis not present

## 2018-11-17 DIAGNOSIS — F419 Anxiety disorder, unspecified: Secondary | ICD-10-CM | POA: Diagnosis present

## 2018-11-17 DIAGNOSIS — F329 Major depressive disorder, single episode, unspecified: Secondary | ICD-10-CM | POA: Diagnosis present

## 2018-11-17 DIAGNOSIS — E119 Type 2 diabetes mellitus without complications: Secondary | ICD-10-CM | POA: Diagnosis present

## 2018-11-17 DIAGNOSIS — K740 Hepatic fibrosis: Secondary | ICD-10-CM | POA: Diagnosis not present

## 2018-11-17 DIAGNOSIS — Z9011 Acquired absence of right breast and nipple: Secondary | ICD-10-CM

## 2018-11-17 DIAGNOSIS — M1990 Unspecified osteoarthritis, unspecified site: Secondary | ICD-10-CM | POA: Diagnosis present

## 2018-11-17 DIAGNOSIS — I2510 Atherosclerotic heart disease of native coronary artery without angina pectoris: Secondary | ICD-10-CM | POA: Diagnosis present

## 2018-11-17 DIAGNOSIS — D631 Anemia in chronic kidney disease: Secondary | ICD-10-CM | POA: Diagnosis present

## 2018-11-17 DIAGNOSIS — C801 Malignant (primary) neoplasm, unspecified: Secondary | ICD-10-CM | POA: Diagnosis not present

## 2018-11-17 DIAGNOSIS — Z79899 Other long term (current) drug therapy: Secondary | ICD-10-CM

## 2018-11-17 DIAGNOSIS — Z9889 Other specified postprocedural states: Secondary | ICD-10-CM

## 2018-11-17 DIAGNOSIS — K449 Diaphragmatic hernia without obstruction or gangrene: Secondary | ICD-10-CM | POA: Diagnosis not present

## 2018-11-17 DIAGNOSIS — C50912 Malignant neoplasm of unspecified site of left female breast: Secondary | ICD-10-CM | POA: Diagnosis not present

## 2018-11-17 HISTORY — PX: LAPAROSCOPY: SHX197

## 2018-11-17 LAB — GLUCOSE, CAPILLARY
Glucose-Capillary: 96 mg/dL (ref 70–99)
Glucose-Capillary: 122 mg/dL — ABNORMAL HIGH (ref 70–99)

## 2018-11-17 SURGERY — LAPAROSCOPY, DIAGNOSTIC
Anesthesia: General | Site: Abdomen | Laterality: Right

## 2018-11-17 MED ORDER — CEFAZOLIN SODIUM-DEXTROSE 2-4 GM/100ML-% IV SOLN
2.0000 g | INTRAVENOUS | Status: AC
  Administered 2018-11-17: 2 g via INTRAVENOUS
  Filled 2018-11-17: qty 100

## 2018-11-17 MED ORDER — ROPIVACAINE HCL 2 MG/ML IJ SOLN
8.0000 mL/h | INTRAMUSCULAR | Status: DC
  Filled 2018-11-17: qty 200

## 2018-11-17 MED ORDER — PHENYLEPHRINE HCL (PRESSORS) 10 MG/ML IV SOLN
INTRAVENOUS | Status: AC
  Filled 2018-11-17: qty 1

## 2018-11-17 MED ORDER — DEXAMETHASONE SODIUM PHOSPHATE 10 MG/ML IJ SOLN
INTRAMUSCULAR | Status: DC | PRN
  Administered 2018-11-17: 5 mg via INTRAVENOUS

## 2018-11-17 MED ORDER — MIDAZOLAM HCL 2 MG/2ML IJ SOLN
INTRAMUSCULAR | Status: AC
  Administered 2018-11-17: 08:00:00 1 mg via INTRAVENOUS
  Filled 2018-11-17: qty 2

## 2018-11-17 MED ORDER — GABAPENTIN 100 MG PO CAPS
ORAL_CAPSULE | ORAL | Status: AC
  Administered 2018-11-17: 08:00:00 100 mg via ORAL
  Filled 2018-11-17: qty 1

## 2018-11-17 MED ORDER — MIDAZOLAM HCL 2 MG/2ML IJ SOLN
1.0000 mg | Freq: Once | INTRAMUSCULAR | Status: AC
  Administered 2018-11-17: 08:00:00 1 mg via INTRAVENOUS

## 2018-11-17 MED ORDER — CHLORHEXIDINE GLUCONATE CLOTH 2 % EX PADS: 6.0000 | MEDICATED_PAD | Freq: Once | CUTANEOUS | Status: DC

## 2018-11-17 MED ORDER — ACETAMINOPHEN 500 MG PO TABS
1000.0000 mg | ORAL_TABLET | ORAL | Status: AC
  Administered 2018-11-17: 1000 mg via ORAL
  Filled 2018-11-17: qty 2

## 2018-11-17 MED ORDER — LACTATED RINGERS IV SOLN
INTRAVENOUS | Status: DC
  Administered 2018-11-17: 08:00:00 1000 mL via INTRAVENOUS

## 2018-11-17 MED ORDER — LIDOCAINE HCL (PF) 1 % IJ SOLN
INTRAMUSCULAR | Status: AC
  Filled 2018-11-17: qty 30

## 2018-11-17 MED ORDER — HYDROCODONE-ACETAMINOPHEN 5-325 MG PO TABS: 1.0000 | ORAL_TABLET | Freq: Four times a day (QID) | ORAL | 0 refills | Status: DC | PRN

## 2018-11-17 MED ORDER — FENTANYL CITRATE (PF) 100 MCG/2ML IJ SOLN: 25.0000 ug | INTRAMUSCULAR | Status: DC | PRN

## 2018-11-17 MED ORDER — LIDOCAINE HCL 1 % IJ SOLN
INTRAMUSCULAR | Status: DC | PRN
  Administered 2018-11-17: 15 mL

## 2018-11-17 MED ORDER — SODIUM CHLORIDE 0.9 % IV SOLN
INTRAVENOUS | Status: DC | PRN
  Administered 2018-11-17: 09:00:00 via INTRAVENOUS

## 2018-11-17 MED ORDER — FENTANYL CITRATE (PF) 100 MCG/2ML IJ SOLN
50.0000 ug | Freq: Once | INTRAMUSCULAR | Status: AC
  Administered 2018-11-17: 50 ug via INTRAVENOUS

## 2018-11-17 MED ORDER — 0.9 % SODIUM CHLORIDE (POUR BTL) OPTIME
TOPICAL | Status: DC | PRN
  Administered 2018-11-17: 1000 mL

## 2018-11-17 MED ORDER — ONDANSETRON HCL 4 MG/2ML IJ SOLN
INTRAMUSCULAR | Status: DC | PRN
  Administered 2018-11-17: 4 mg via INTRAVENOUS

## 2018-11-17 MED ORDER — SUGAMMADEX SODIUM 200 MG/2ML IV SOLN
INTRAVENOUS | Status: DC | PRN
  Administered 2018-11-17: 200 mg via INTRAVENOUS
  Administered 2018-11-17: 100 mg via INTRAVENOUS

## 2018-11-17 MED ORDER — ROCURONIUM BROMIDE 10 MG/ML (PF) SYRINGE
PREFILLED_SYRINGE | INTRAVENOUS | Status: DC | PRN
  Administered 2018-11-17 (×2): 50 mg via INTRAVENOUS

## 2018-11-17 MED ORDER — GABAPENTIN 100 MG PO CAPS
100.0000 mg | ORAL_CAPSULE | ORAL | Status: AC
  Administered 2018-11-17: 08:00:00 100 mg via ORAL

## 2018-11-17 MED ORDER — FENTANYL CITRATE (PF) 100 MCG/2ML IJ SOLN
50.0000 ug | Freq: Once | INTRAMUSCULAR | Status: AC
  Administered 2018-11-17: 08:00:00 50 ug via INTRAVENOUS

## 2018-11-17 MED ORDER — PROMETHAZINE HCL 25 MG/ML IJ SOLN: 6.2500 mg | INTRAMUSCULAR | Status: DC | PRN

## 2018-11-17 MED ORDER — PROPOFOL 10 MG/ML IV BOLUS
INTRAVENOUS | Status: DC | PRN
  Administered 2018-11-17: 120 mg via INTRAVENOUS
  Administered 2018-11-17: 20 mg via INTRAVENOUS
  Administered 2018-11-17: 30 mg via INTRAVENOUS

## 2018-11-17 MED ORDER — SODIUM CHLORIDE 0.9 % IV SOLN
INTRAVENOUS | Status: DC | PRN
  Administered 2018-11-17: 10 ug/min via INTRAVENOUS

## 2018-11-17 MED ORDER — FENTANYL CITRATE (PF) 100 MCG/2ML IJ SOLN
INTRAMUSCULAR | Status: AC
  Administered 2018-11-17: 08:00:00 50 ug via INTRAVENOUS
  Filled 2018-11-17: qty 2

## 2018-11-17 MED ORDER — HYDROMORPHONE HCL 1 MG/ML IJ SOLN
INTRAMUSCULAR | Status: DC | PRN
  Administered 2018-11-17: 0.5 mg via INTRAVENOUS

## 2018-11-17 MED ORDER — LACTATED RINGERS IV SOLN
INTRAVENOUS | Status: DC | PRN
  Administered 2018-11-17 (×3): via INTRAVENOUS

## 2018-11-17 MED ORDER — FENTANYL CITRATE (PF) 250 MCG/5ML IJ SOLN
INTRAMUSCULAR | Status: AC
  Filled 2018-11-17: qty 5

## 2018-11-17 MED ORDER — GLYCOPYRROLATE PF 0.2 MG/ML IJ SOSY
PREFILLED_SYRINGE | INTRAMUSCULAR | Status: DC | PRN
  Administered 2018-11-17 (×2): .1 mg via INTRAVENOUS

## 2018-11-17 MED ORDER — FENTANYL CITRATE (PF) 250 MCG/5ML IJ SOLN
INTRAMUSCULAR | Status: DC | PRN
  Administered 2018-11-17: 50 ug via INTRAVENOUS

## 2018-11-17 MED ORDER — LIDOCAINE 2% (20 MG/ML) 5 ML SYRINGE
INTRAMUSCULAR | Status: DC | PRN
  Administered 2018-11-17: 80 mg via INTRAVENOUS

## 2018-11-17 MED ORDER — BUPIVACAINE-EPINEPHRINE (PF) 0.25% -1:200000 IJ SOLN
INTRAMUSCULAR | Status: AC
  Filled 2018-11-17: qty 30

## 2018-11-17 MED ORDER — HYDROMORPHONE HCL 1 MG/ML IJ SOLN
INTRAMUSCULAR | Status: AC
  Filled 2018-11-17: qty 0.5

## 2018-11-17 SURGICAL SUPPLY — 86 items
BLADE CLIPPER SURG (BLADE) IMPLANT
BOOT SUTURE AID YELLOW STND (SUTURE) IMPLANT
CANISTER SUCT 3000ML PPV (MISCELLANEOUS) ×3 IMPLANT
CHLORAPREP W/TINT 26 (MISCELLANEOUS) ×3 IMPLANT
CLIP VESOCCLUDE LG 6/CT (CLIP) ×3 IMPLANT
CLIP VESOCCLUDE MED 24/CT (CLIP) ×3 IMPLANT
CLIP VESOLOCK LG 6/CT PURPLE (CLIP) ×3 IMPLANT
CLIP VESOLOCK MED 6/CT (CLIP) ×3 IMPLANT
CLIP VESOLOCK MED LG 6/CT (CLIP) ×3 IMPLANT
CONT SPEC 4OZ CLIKSEAL STRL BL (MISCELLANEOUS) ×9 IMPLANT
COUNTER NEEDLE 20 DBL MAG RED (NEEDLE) IMPLANT
COVER SURGICAL LIGHT HANDLE (MISCELLANEOUS) ×3 IMPLANT
COVER WAND RF STERILE (DRAPES) IMPLANT
DECANTER SPIKE VIAL GLASS SM (MISCELLANEOUS) ×6 IMPLANT
DERMABOND ADVANCED (GAUZE/BANDAGES/DRESSINGS) ×1
DERMABOND ADVANCED .7 DNX12 (GAUZE/BANDAGES/DRESSINGS) ×2 IMPLANT
DRAIN CHANNEL 19F RND (DRAIN) IMPLANT
DRAIN PENROSE 18X1/2 LTX STRL (DRAIN) IMPLANT
DRAPE LAPAROSCOPIC ABDOMINAL (DRAPES) ×3 IMPLANT
DRAPE WARM FLUID 44X44 (DRAPES) ×3 IMPLANT
DRSG COVADERM 4X10 (GAUZE/BANDAGES/DRESSINGS) IMPLANT
DRSG COVADERM 4X14 (GAUZE/BANDAGES/DRESSINGS) IMPLANT
DRSG TELFA 3X8 NADH (GAUZE/BANDAGES/DRESSINGS) IMPLANT
ELECT BLADE 6.5 EXT (BLADE) ×3 IMPLANT
ELECT PAD DSPR THERM+ ADLT (MISCELLANEOUS) ×3 IMPLANT
ELECT REM PT RETURN 9FT ADLT (ELECTROSURGICAL) ×3
ELECTRODE REM PT RTRN 9FT ADLT (ELECTROSURGICAL) ×2 IMPLANT
EVACUATOR SILICONE 100CC (DRAIN) IMPLANT
GAUZE 4X4 16PLY RFD (DISPOSABLE) IMPLANT
GLOVE BIO SURGEON STRL SZ 6 (GLOVE) ×6 IMPLANT
GLOVE INDICATOR 6.5 STRL GRN (GLOVE) ×6 IMPLANT
GOWN STRL REUS W/ TWL LRG LVL3 (GOWN DISPOSABLE) ×4 IMPLANT
GOWN STRL REUS W/TWL 2XL LVL3 (GOWN DISPOSABLE) ×6 IMPLANT
GOWN STRL REUS W/TWL LRG LVL3 (GOWN DISPOSABLE) ×2
HANDLE SUCTION POOLE (INSTRUMENTS) ×2 IMPLANT
HEMOSTAT SURGICEL 2X14 (HEMOSTASIS) IMPLANT
KIT BASIN OR (CUSTOM PROCEDURE TRAY) ×3 IMPLANT
KIT TURNOVER KIT B (KITS) ×3 IMPLANT
L-HOOK LAP DISP 36CM (ELECTROSURGICAL) ×3
LHOOK LAP DISP 36CM (ELECTROSURGICAL) ×2 IMPLANT
LOOP VESSEL MAXI BLUE (MISCELLANEOUS) IMPLANT
NEEDLE 22X1 1/2 (OR ONLY) (NEEDLE) ×3 IMPLANT
NEEDLE BIOPSY 14X6 SOFT TISS (NEEDLE) IMPLANT
NS IRRIG 1000ML POUR BTL (IV SOLUTION) ×6 IMPLANT
PACK GENERAL/GYN (CUSTOM PROCEDURE TRAY) ×3 IMPLANT
PAD ARMBOARD 7.5X6 YLW CONV (MISCELLANEOUS) IMPLANT
PENCIL BUTTON HOLSTER BLD 10FT (ELECTRODE) ×3 IMPLANT
PENCIL SMOKE EVACUATOR (MISCELLANEOUS) ×3 IMPLANT
PLUG CATH AND CAP STER (CATHETERS) IMPLANT
SCISSORS HARMONIC WAVE 18CM (INSTRUMENTS) IMPLANT
SCISSORS LAP 5X35 DISP (ENDOMECHANICALS) IMPLANT
SET IRRIG TUBING LAPAROSCOPIC (IRRIGATION / IRRIGATOR) IMPLANT
SET TUBE SMOKE EVAC HIGH FLOW (TUBING) ×3 IMPLANT
SHEARS FOC LG CVD HARMONIC 17C (MISCELLANEOUS) IMPLANT
SLEEVE ENDOPATH XCEL 5M (ENDOMECHANICALS) ×3 IMPLANT
SLEEVE SUCTION CATH 165 (SLEEVE) ×3 IMPLANT
SPONGE INTESTINAL PEANUT (DISPOSABLE) IMPLANT
SPONGE LAP 18X18 RF (DISPOSABLE) IMPLANT
SPONGE SURGIFOAM ABS GEL 100 (HEMOSTASIS) IMPLANT
STAPLER VISISTAT 35W (STAPLE) IMPLANT
SUCTION POOLE HANDLE (INSTRUMENTS) ×3
SUT CHROMIC 3 0 SH 27 (SUTURE) IMPLANT
SUT CHROMIC 4 0 RB 1X27 (SUTURE) IMPLANT
SUT ETHILON 2 0 FS 18 (SUTURE) ×3 IMPLANT
SUT MNCRL AB 4-0 PS2 18 (SUTURE) ×3 IMPLANT
SUT PDS II 0 TP-1 LOOPED 60 (SUTURE) IMPLANT
SUT PROLENE 3 0 SH 48 (SUTURE) ×3 IMPLANT
SUT PROLENE 4 0 RB 1 (SUTURE)
SUT PROLENE 4 0 SH DA (SUTURE) ×3 IMPLANT
SUT PROLENE 4-0 RB1 .5 CRCL 36 (SUTURE) IMPLANT
SUT PROLENE 5 0 C1 (SUTURE) IMPLANT
SUT VIC AB 2-0 SH 18 (SUTURE) IMPLANT
SUT VIC AB 3-0 SH 18 (SUTURE) IMPLANT
SUT VICRYL AB 2 0 TIES (SUTURE) IMPLANT
SUT VICRYL AB 3 0 TIES (SUTURE) ×3 IMPLANT
TAPE UMBILICAL 1/8 X36 TWILL (MISCELLANEOUS) IMPLANT
TOWEL GREEN STERILE (TOWEL DISPOSABLE) ×3 IMPLANT
TOWEL GREEN STERILE FF (TOWEL DISPOSABLE) ×3 IMPLANT
TRAY FOLEY MTR SLVR 14FR STAT (SET/KITS/TRAYS/PACK) ×3 IMPLANT
TRAY LAPAROSCOPIC MC (CUSTOM PROCEDURE TRAY) ×3 IMPLANT
TROCAR XCEL BLUNT TIP 100MML (ENDOMECHANICALS) IMPLANT
TROCAR XCEL NON-BLD 11X100MML (ENDOMECHANICALS) IMPLANT
TROCAR XCEL NON-BLD 5MMX100MML (ENDOMECHANICALS) ×3 IMPLANT
TUBE FEEDING ENTERAL 5FR 16IN (TUBING) IMPLANT
TUNNELER SHEATH ON-Q 16GX12 DP (PAIN MANAGEMENT) IMPLANT
YANKAUER SUCT BULB TIP NO VENT (SUCTIONS) ×3 IMPLANT

## 2018-11-17 NOTE — Anesthesia Procedure Notes (Signed)
Epidural Patient location during procedure: pre-op Start time: 11/17/2018 8:24 AM End time: 11/17/2018 8:30 AM  Staffing Anesthesiologist: Brennan Bailey, MD Performed: anesthesiologist   Preanesthetic Checklist Completed: patient identified, pre-op evaluation, timeout performed, IV checked, risks and benefits discussed and monitors and equipment checked  Epidural Patient position: sitting Prep: site prepped and draped and DuraPrep Patient monitoring: continuous pulse ox, blood pressure and heart rate Approach: right paramedian Location: thoracic (1-12) (T8) Injection technique: LOR saline  Needle:  Needle type: Tuohy  Needle gauge: 17 G Needle length: 9 cm Catheter type: closed end flexible Catheter size: 19 Gauge Catheter at skin depth: 12 cm Test dose: negative and 1.5% lidocaine with Epi 1:200 K  Assessment Events: blood not aspirated, injection not painful, no injection resistance, negative IV test and no paresthesia  Additional Notes Patient identified. Risks, benefits, and alternatives discussed with patient including but not limited to bleeding, infection, nerve damage, paralysis, failed block, incomplete pain control, headache, blood pressure changes, nausea, vomiting, reactions to medication, itching, and back pain. Confirmed with bedside nurse the patient's most recent platelet count. Confirmed with patient that they are not currently taking any anticoagulation, have any bleeding history, or any family history of bleeding disorders. Patient expressed understanding and wished to proceed. All questions were answered. Sterile technique was used throughout the entire procedure. Please see nursing notes for vital signs.   First attempt at T7 right paramedian unsuccessful. Second attempt at T8 right paramedian with crisp LOR to saline. Test dose was given through epidural catheter (3cc 1.5 lidocaine with epi) and negative prior to continuing to dose epidural or start infusion.   Reason for block:at surgeon's request and post-op pain management

## 2018-11-17 NOTE — Anesthesia Postprocedure Evaluation (Signed)
Anesthesia Post Note  Patient: JAYLENE ARROWOOD  Procedure(s) Performed: LAPAROSCOPY DIAGNOSTIC, INTRAOPERATIVE ULTRASOUND, PERITONEAL BIOPSIES (N/A Abdomen)     Patient location during evaluation: PACU Anesthesia Type: General Level of consciousness: awake and alert and oriented Pain management: pain level controlled Vital Signs Assessment: post-procedure vital signs reviewed and stable Respiratory status: spontaneous breathing, nonlabored ventilation and respiratory function stable Cardiovascular status: blood pressure returned to baseline Postop Assessment: no apparent nausea or vomiting Anesthetic complications: no    Last Vitals:  Vitals:   11/17/18 1256 11/17/18 1312  BP: (!) 121/54 108/87  Pulse: (!) 46 (!) 44  Resp: 14 12  Temp:    SpO2: 95% 92%    Last Pain:  Vitals:   11/17/18 1256  TempSrc:   PainSc: 0-No pain                 Brennan Bailey

## 2018-11-17 NOTE — Op Note (Addendum)
PRE-OPERATIVE DIAGNOSIS: adenocarcinoma of the liver- gallbladder vs cholangiocarcinoma  POST-OPERATIVE DIAGNOSIS:  Same with peritoneal spread  PROCEDURE:  Procedure(s): Diagnostic laparoscopy, peritoneal biopsies, intraoperative ultrasound  SURGEON:  Surgeon(s): [name], MD  ANESTHESIA:   local, epidural and general  DRAINS: none   LOCAL MEDICATIONS USED:  BUPIVICAINE  and LIDOCAINE   SPECIMEN:  Source of Specimen:  diaphragmatic nodule, left liver nodules, stomach nodule  DISPOSITION OF SPECIMEN:  PATHOLOGY  COUNTS:  YES  DICTATION: .Dragon Dictation  PLAN OF CARE: Discharge to home after PACU  PATIENT DISPOSITION:  PACU - hemodynamically stable.  FINDINGS:  Concerning lesion on diaphragm near tumor. Nodules on left liver and nodule on stomach  EBL: min  PROCEDURE:  Patient was identified in the holding area and taken the operating room where she was placed supine on the operating room table.  General endotracheal anesthesia was induced.  Her arms were tucked, Foley catheter was placed, and her abdomen was prepped and draped in sterile fashion.  A timeout was performed according to the surgical safety checklist.  When all was correct, we continued.   The patient was rotated to the right and placed in reverse Trendelenburg position.  A 5 mm Optiview port was placed in the left upper quadrant at the costal margin after administration of local and under direct visualization.  Pneumoperitoneum was achieved to a pressure of 15 mmHg.  A second port was placed just above the umbilicus.  The upper abdomen was examined.  The tumor was quite large and umbilicated above the gallbladder.  There were several satellite nodules.  There was a concerning nodule on the diaphragm just above the lesion.  There are several other tiny diaphragmatic nodules.  These were sent for frozen section.  The left liver had several lesions as well, but these appeared like more hamartomas.  These were  biopsied.  A small nodule on the peritoneum at the border of the stomach was biopsied as well with the laparoscopic biopsy forceps.  Hemostasis was achieved with the cautery in all of these locations.  The supraumbilical port was upsized to a 12 port for the ultrasound probe.  The ultrasound was difficult because of the umbilicated nature of the tumor on the surface of the liver.  However the primary lesion was large and the satellite lesions were also quite large.  There was no evidence of tumor in the left lateral segment and no evidence of invasion into adjacent structures.  The frozen section did return as metastatic adenocarcinoma on the diaphragmatic nodule.  The procedure was aborted.  The fascia of the 12 mm port was closed with a 0 Vicryl.  The skin of the incisions was closed with 4-0 Monocryl in subcuticular fashion after the pneumoperitoneum was allowed to evacuate.  The wounds were cleaned, dried, and dressed with Dermabond.  The patient was allowed to emerge from anesthesia and taken to the PACU in stable condition.  Needle, sponge, and instrument counts were correct x2.

## 2018-11-17 NOTE — Anesthesia Procedure Notes (Signed)
Central Venous Catheter Insertion Performed by: Brennan Bailey, MD, anesthesiologist Start/End9/05/2018 8:47 AM, 11/17/2018 8:50 AM Patient location: Pre-op. Preanesthetic checklist: patient identified, IV checked, site marked, risks and benefits discussed, surgical consent, monitors and equipment checked, pre-op evaluation, timeout performed and anesthesia consent Position: Trendelenburg Lidocaine 1% used for infiltration and patient sedated Hand hygiene performed  and maximum sterile barriers used  Catheter size: 8 Fr Total catheter length 16. Central line was placed.Double lumen Procedure performed using ultrasound guided technique. Ultrasound Notes:anatomy identified, needle tip was noted to be adjacent to the nerve/plexus identified, no ultrasound evidence of intravascular and/or intraneural injection and image(s) printed for medical record Attempts: 1 Following insertion, dressing applied, line sutured and Biopatch. Post procedure assessment: blood return through all ports  Patient tolerated the procedure well with no immediate complications.

## 2018-11-17 NOTE — Anesthesia Procedure Notes (Signed)
Arterial Line Insertion Performed by: CRNA  Patient location: Pre-op. Preanesthetic checklist: patient identified, IV checked, site marked, risks and benefits discussed, surgical consent, monitors and equipment checked, pre-op evaluation, timeout performed and anesthesia consent Left, radial was placed Catheter size: 20 G Hand hygiene performed , maximum sterile barriers used  and Seldinger technique used Allen's test indicative of satisfactory collateral circulation Attempts: 1 Procedure performed without using ultrasound guided technique. Following insertion, dressing applied and Biopatch. Post procedure assessment: normal  Patient tolerated the procedure well with no immediate complications.

## 2018-11-17 NOTE — Transfer of Care (Signed)
Immediate Anesthesia Transfer of Care Note  Patient: Donna May  Procedure(s) Performed: LAPAROSCOPY DIAGNOSTIC, INTRAOPERATIVE ULTRASOUND, PERITONEAL BIOPSIES (N/A Abdomen)  Patient Location: PACU  Anesthesia Type:GA combined with regional for post-op pain  Level of Consciousness: awake and alert   Airway & Oxygen Therapy: Patient Spontanous Breathing and Patient connected to face mask oxygen  Post-op Assessment: Report given to RN and Post -op Vital signs reviewed and stable  Post vital signs: Reviewed and stable  Last Vitals:  Vitals Value Taken Time  BP 126/62 11/17/18 1126  Temp    Pulse 63 11/17/18 1131  Resp 12 11/17/18 1131  SpO2 95 % 11/17/18 1131  Vitals shown include unvalidated device data.  Last Pain:  Vitals:   11/17/18 0845  TempSrc:   PainSc: 0-No pain      Patients Stated Pain Goal: 4 (11/88/67 7373)  Complications: No apparent anesthesia complications

## 2018-11-17 NOTE — Telephone Encounter (Signed)
Scheduled apt per 9/03 sch message - unable to reach pt . Left message with appt date and time

## 2018-11-17 NOTE — Anesthesia Procedure Notes (Signed)
Procedure Name: Intubation Date/Time: 11/17/2018 9:21 AM Performed by: Valda Favia, CRNA Pre-anesthesia Checklist: Patient identified, Emergency Drugs available, Suction available, Patient being monitored and Timeout performed Patient Re-evaluated:Patient Re-evaluated prior to induction Oxygen Delivery Method: Circle system utilized Preoxygenation: Pre-oxygenation with 100% oxygen Induction Type: IV induction Ventilation: Mask ventilation without difficulty Laryngoscope Size: Mac and 4 Grade View: Grade II Tube type: Oral Tube size: 7.0 mm Number of attempts: 1 Airway Equipment and Method: Stylet Placement Confirmation: ETT inserted through vocal cords under direct vision,  positive ETCO2 and breath sounds checked- equal and bilateral Secured at: 20 cm Tube secured with: Tape Dental Injury: Teeth and Oropharynx as per pre-operative assessment

## 2018-11-17 NOTE — Discharge Instructions (Addendum)
[facility] Office Phone Number [phone]   POST OP INSTRUCTIONS  Always review your discharge instruction sheet given to you by the facility where your surgery was performed.  IF YOU HAVE DISABILITY OR FAMILY LEAVE FORMS, YOU MUST BRING THEM TO THE OFFICE FOR PROCESSING.  DO NOT GIVE THEM TO YOUR DOCTOR.  1. A prescription for pain medication may be given to you upon discharge.  Take your pain medication as prescribed, if needed.  If narcotic pain medicine is not needed, then you may take acetaminophen (Tylenol) or ibuprofen (Advil) as needed. 2. Take your usually prescribed medications unless otherwise directed 3. If you need a refill on your pain medication, please contact your pharmacy.  They will contact our office to request authorization.  Prescriptions will not be filled after 5pm or on week-ends. 4. You should eat very light the first 24 hours after surgery, such as soup, crackers, pudding, etc.  Resume your normal diet the day after surgery 5. It is common to experience some constipation if taking pain medication after surgery.  Increasing fluid intake and taking a stool softener will usually help or prevent this problem from occurring.  A mild laxative (Milk of Magnesia or Miralax) should be taken according to package directions if there are no bowel movements after 48 hours. 6. You may shower in 48 hours.  The surgical glue will flake off in 2-3 weeks.   7. ACTIVITIES:  No strenuous activity or heavy lifting for 1 week.   a. You may drive when you no longer are taking prescription pain medication, you can comfortably wear a seatbelt, and you can safely maneuver your car and apply brakes. b. RETURN TO WORK:  __________n/a_______________ You should see your doctor in the office for a follow-up appointment approximately three-four weeks after your surgery.    WHEN TO CALL YOUR DOCTOR: 1. Fever over 101.0 2. Nausea and/or vomiting. 3. Extreme swelling or  bruising. 4. Continued bleeding from incision. 5. Increased pain, redness, or drainage from the incision.  The clinic staff is available to answer your questions during regular business hours.  Please don't hesitate to call and ask to speak to one of the nurses for clinical concerns.  If you have a medical emergency, go to the nearest emergency room or call 911.  A surgeon from [facility] is always on call at the hospital.  For further questions, please visit [email or web address]      

## 2018-11-18 ENCOUNTER — Encounter (HOSPITAL_COMMUNITY): Payer: Self-pay | Admitting: General Surgery

## 2018-11-18 NOTE — Progress Notes (Signed)
[name]   [phone] [phone]   Clinic Follow up Note   Patient Care Team: [name], MD as PCP - General (Family Medicine) [name], [name] Dad, MD as Consulting Physician (Oncology) [name], [name], MD as Consulting Physician (Cardiology) [name], MD as Consulting Physician (Obstetrics and Gynecology) [name], [name], NP as Nurse Practitioner (Hematology and Oncology) [facility] [name], MD as Consulting Physician (Hematology) [name], [name], MD as Consulting Physician (Gastroenterology) [name], RN as Oncology Nurse Navigator [name], MD as Consulting Physician (General Surgery)  Date of Service:  11/24/2018  CHIEF COMPLAINT: F/u of cholangiocarcinomaof liver  SUMMARY OF ONCOLOGIC HISTORY: Oncology History  Malignant neoplasm of female breast ([place])  11/06/2018 Genetic Testing   Negative genetic testing on the [facility] Common Hereditary Cancers panel. A variant of uncertain significance was identified in one of her APC genes, called c.1243G>A (p.Ala415Thr).  The Common Hereditary Cancers Panel offered by [facility] includes sequencing and/or deletion duplication testing of the following 48 genes: APC, ATM, AXIN2, BARD1, BMPR1A, BRCA1, BRCA2, BRIP1, CDH1, CDK4, CDKN2A (p14ARF), CDKN2A (p16INK4a), CHEK2, CTNNA1, DICER1, EPCAM (Deletion/duplication testing only), GREM1 (promoter region deletion/duplication testing only), KIT, MEN1, MLH1, MSH2, MSH3, MSH6, MUTYH, NBN, NF1, NHTL1, PALB2, PDGFRA, PMS2, POLD1, POLE, PTEN, RAD50, RAD51C, RAD51D, RNF43, SDHB, SDHC, SDHD, SMAD4, SMARCA4. STK11, TP53, TSC1, TSC2, and VHL.  The following genes were evaluated for sequence changes only: SDHA and HOXB13 c.251G>A variant only.    Intrahepatic cholangiocarcinoma ([name])  09/07/2018 Imaging   CT Chest IMPRESSION: 1. New, enhancing mass involving segment 4 of the liver and fundus of gallbladder is  concerning for malignancy. This may represent either metastatic disease from breast cancer or neoplasm primary to the liver or hepatic biliary tree. Further evaluation with contrast enhanced CT of the abdomen and pelvis is recommended. 2. No findings to suggest metastatic disease within the chest. 3.  Aortic Atherosclerosis (ICD10-I70.0). 4. Coronary artery calcifications.   09/13/2018 Pathology Results   Diagnosis Liver, needle/core biopsy - ADENOCARCINOMA. Microscopic Comment Immunohistochemistry for CK7 is positive. CK20, TTF1, CDX-2, GATA-3, PAX 8, Qualitative ER, p63 and CK5/6 are negative. The provided clinical history of remote mammary carcinoma is noted. Based on the morphology and immunophenotype of the adenocarcinoma observed in this specimen, primary cholangiocarcinoma is favored. Clinical and radiologic correlation are  encouraged. Results reported to [employer] on 09/15/2018. Intradepartmental consultation (Dr. Vic Ripper).   09/13/2018 Initial Diagnosis   Cholangiocarcinoma ([name])   09/23/2018 Procedure   Colonoscopy by Dr. Havery Moros 09/23/18  IMPRESSION - Two 3 to 4 mm polyps in the ascending colon, removed with a cold snare. Resected and retrieved. - Five 3 to 5 mm polyps in the transverse colon, removed with a cold snare. Resected and retrieved. - One 5 mm polyp at the splenic flexure, removed with a cold snare. Resected and retrieved. - Three 3 to 5 mm polyps in the sigmoid colon, removed with a cold snare. Resected and retrieved. - The examination was otherwise normal. Upper Endopscy by Dr. Havery Moros 09/23/18  IMPRESSION - Esophagogastric landmarks identified. - 2 cm hiatal hernia. - Normal esophagus otherwise. - A single gastric polyp. Resected and retrieved. - Mild gastritis. Biopsied. - Normal duodenal bulb and second portion of the duodenum.   09/23/2018 Pathology Results   Diagnosis 09/23/18 1. Surgical [P], duodenum - BENIGN SMALL BOWEL MUCOSA. - NO ACTIVE  INFLAMMATION OR VILLOUS ATROPHY IDENTIFIED. 2. Surgical [P], stomach, polyp - HYPERPLASTIC POLYP(S). - THERE IS NO EVIDENCE OF  MALIGNANCY. 3. Surgical [P], gastric antrum and gastric body - CHRONIC INACTIVE GASTRITIS. - THERE IS NO EVIDENCE OF HELICOBACTER-PYLORI, DYSPLASIA, OR MALIGNANCY. - SEE COMMENT. 4. Surgical [P], colon, sigmoid, splenic flexure, transverse and ascending, polyp (9) - TUBULAR ADENOMA(S). - SESSILE SERRATED POLYP WITHOUT CYTOLOGIC DYSPLASIA. - HIGH GRADE DYSPLASIA IS NOT IDENTIFIED. 5. Surgical [P], colon, sigmoid, polyp (2) - HYPERPLASTIC POLYP(S). - THERE IS NO EVIDENCE OF MALIGNANCY.   09/23/2018 Cancer Staging   Staging form: Intrahepatic Bile Duct, AJCC 8th Edition - Clinical stage from 09/23/2018: Stage IB (cT1b, cN0, cM0) - Signed by [name], MD on 10/06/2018   09/29/2018 PET scan   PET 09/29/18 IMPRESSION: 1. Hypermetabolic mass in the RIGHT hepatic lobe consistent with biopsy proven adenocarcinoma. No additional liver metastasis. 2. No evidence of local breast cancer recurrence in the RIGHT breast or RIGHT axilla. 3. Mild bilateral hypermetabolic adrenal glands is favored benign hyperplasia. 4. No evidence of additional metastatic disease on skull base to thigh FDG PET scan.   11/06/2018 Genetic Testing   Negative genetic testing on the [facility] Common Hereditary Cancers panel. A variant of uncertain significance was identified in one of her APC genes, called c.1243G>A (p.Ala415Thr).  The Common Hereditary Cancers Panel offered by [facility] includes sequencing and/or deletion duplication testing of the following 48 genes: APC, ATM, AXIN2, BARD1, BMPR1A, BRCA1, BRCA2, BRIP1, CDH1, CDK4, CDKN2A (p14ARF), CDKN2A (p16INK4a), CHEK2, CTNNA1, DICER1, EPCAM (Deletion/duplication testing only), GREM1 (promoter region deletion/duplication testing only), KIT, MEN1, MLH1, MSH2, MSH3, MSH6, MUTYH, NBN, NF1, NHTL1, PALB2, PDGFRA, PMS2, POLD1, POLE, PTEN, RAD50, RAD51C,  RAD51D, RNF43, SDHB, SDHC, SDHD, SMAD4, SMARCA4. STK11, TP53, TSC1, TSC2, and VHL.  The following genes were evaluated for sequence changes only: SDHA and HOXB13 c.251G>A variant only.    11/17/2018 Pathology Results   Diagnosis 11/17/18 1. Soft tissue, biopsy, Diaphragmatic nodules - METASTATIC ADENOCARCINOMA, CONSISTENT WITH PATIENT'S CLINICAL HISTORY OF CHOLANGIOCARCINOMA. SEE NOTE 2. Liver, biopsy, Left - LIVER PARENCHYMA WITH A BENIGN FIBROTIC NODULE - NO EVIDENCE OF MALIGNANCY 3. Stomach, biopsy - BENIGN PAPILLARY MESOTHELIAL HYPERPLASIA - NO EVIDENCE OF MALIGNANCY   11/30/2018 -  Chemotherapy   The patient had PALONOSETRON HCL INJECTION 0.25 MG/5ML, 0.25 mg, Intravenous,  Once, 0 of 4 cycles oxaliplatin (ELOXATIN) 175 mg in dextrose 5 % 500 mL chemo infusion, 100 mg/m2, Intravenous,  Once, 0 of 4 cycles gemcitabine (GEMZAR) 1,748 mg in sodium chloride 0.9 % 100 mL chemo infusion, 1,000 mg/m2, Intravenous,  Once, 0 of 4 cycles  for chemotherapy treatment.       CURRENT THERAPY:  Pending first line chemo oxalic platinum and gemcitabine  INTERVAL HISTORY:  CARNISHA FELTZ is here for a follow up. She presents to the clinic alone. I called her son and daughter to be on the phone during the visit.  She was brought to the OR by Dr. Barry Dienes for liver resection, unfortunately was found to have metastatic disease to diaphragm, surgery was aborted.  She is here to discuss treatment options.  She has recovered well from the exploratory laps last week, with minimal pain in this small incisions.  She has vague mild discomfort in the right upper quadrant of abdomen, no significant nausea or bloating.  Her appetite and energy level are decent, she lives alone, able to function well at home.    REVIEW OF SYSTEMS:   Constitutional: Denies fevers, chills or abnormal weight loss, mild fatigue  Eyes: Denies blurriness of vision Ears, nose, mouth, throat, and face: Denies mucositis or sore  throat Respiratory: Denies cough, dyspnea or wheezes Cardiovascular: Denies palpitation, chest discomfort or lower extremity swelling Gastrointestinal:  Denies nausea, heartburn or change in bowel habits Skin: Denies abnormal skin rashes Lymphatics: Denies new lymphadenopathy or easy bruising Neurological:Denies numbness, tingling or new weaknesses Behavioral/Psych: Mood is stable, no new changes  All other systems were reviewed with the patient and are negative.  MEDICAL HISTORY:  Past Medical History:  Diagnosis Date   Anemia    Anxiety    Arthritis    Breast cancer ([place]) 1992   Cataract    Bilateral eyes - surgery to remove   Chronic kidney disease    CKD stage 3   Depression    Diabetes mellitus    type 2   Duodenitis 01/18/2002   Fainting spell    Family history of breast cancer    Family history of prostate cancer    GERD (gastroesophageal reflux disease)    Heart murmur    never has caused any problems   Hiatal hernia 08/08/2008, 01/18/2002   History of pneumonia    x 2   Hyperlipidemia    Hypertension    Liver cancer ([place]) 08/2018   Lymphedema 2017   Right arm   Pneumonia    x 2   UTI (lower urinary tract infection)     SURGICAL HISTORY: Past Surgical History:  Procedure Laterality Date   AXILLARY SURGERY     cyst removal, right   COLONOSCOPY  09/23/2018   Dr. Havery Moros - polyps   EYE SURGERY Bilateral    cataracts to remove   LAPAROSCOPY N/A 11/17/2018   Procedure: LAPAROSCOPY DIAGNOSTIC, INTRAOPERATIVE ULTRASOUND, PERITONEAL BIOPSIES;  Surgeon: [name], MD;  Location: [facility];  Service: General;  Laterality: N/A;  GENERAL AND EPIDURAL   LIVER BIOPSY  08/2018   Dr. Lindwood Coke   MASTECTOMY  1992   right, with flap   NM MYOCAR PERF WALL MOTION  06/11/2009   Protocol:[place], post stress EF58%, EKG negative for ischemia, low risk   RECONSTRUCTION BREAST W/ TRAM FLAP Right    TONSILLECTOMY     TRANSTHORACIC ECHOCARDIOGRAM   12/24/2009   LVEF =>55%, normal study   UPPER GI ENDOSCOPY      I have reviewed the social history and family history with the patient and they are unchanged from previous note.  ALLERGIES:  has No Known Allergies.  MEDICATIONS:  Current Outpatient Medications  Medication Sig Dispense Refill   acetaminophen (TYLENOL) 650 MG CR tablet Take 1,300 mg by mouth every 8 (eight) hours as needed for pain.     amLODipine (NORVASC) 10 MG tablet TAKE 1 TABLET BY MOUTH EVERY DAY (Patient taking differently: Take 10 mg by mouth daily. ) 90 tablet 2   aspirin 81 MG tablet Take 81 mg by mouth at bedtime.      CALCIUM-MAGNESIUM-VITAMIN D PO Take 1 tablet by mouth once a week.      Cholecalciferol (VITAMIN D3) 2000 UNITS TABS Take 1 tablet by mouth once a week.      Echinacea 400 MG CAPS Take 400 mg by mouth daily.      glucose blood (ACCU-CHEK AVIVA) test strip Use as instructed 100 each 4   ketoconazole (NIZORAL) 2 % cream Apply 1 fingertip amount to each foot daily. 30 g 0   lansoprazole (PREVACID) 15 MG capsule TAKE 1 CAPSULE DAILY BEFOREBREAKFAST (NEED TO MAKE AN OFFICE VISIT FOR FURTHER   REFILLS) (Patient taking differently: Take 15 mg by mouth daily. ) 90  capsule 1   loratadine (CLARITIN) 10 MG tablet Take 10 mg by mouth daily as needed for allergies.     metFORMIN (GLUCOPHAGE-XR) 500 MG 24 hr tablet Take 1 tablet (500 mg total) by mouth 2 (two) times daily before a meal. 180 tablet 1   metoprolol succinate (TOPROL-XL) 100 MG 24 hr tablet TAKE 1 TABLET (100 MG TOTAL) BY MOUTH DAILY. TAKE WITH OR IMMEDIATELY FOLLOWING A MEAL. 90 tablet 1   Multiple Vitamins-Minerals (CENTRUM SILVER ULTRA WOMENS) TABS Take 1 tablet by mouth once a week.      Omega 3 1000 MG CAPS Take 2,000 mg by mouth daily.      promethazine-dextromethorphan (PROMETHAZINE-DM) 6.25-15 MG/5ML syrup Take 5 mLs by mouth 3 (three) times daily as needed for cough. 100 mL 0   raloxifene (EVISTA) 60 MG tablet TAKE 1 TABLET  DAILY (Patient taking differently: Take 60 mg by mouth daily. ) 90 tablet 0   rosuvastatin (CRESTOR) 20 MG tablet Take 1 tablet (20 mg total) by mouth daily. (Patient taking differently: Take 20 mg by mouth at bedtime. ) 90 tablet 0   sitaGLIPtin (JANUVIA) 100 MG tablet Take 1 tablet (100 mg total) by mouth daily. 90 tablet 4   triamcinolone cream (KENALOG) 0.1 % Apply 1 application topically 2 (two) times daily. (Patient taking differently: Apply 1 application topically 2 (two) times daily as needed (rash). ) 30 g 0   valsartan (DIOVAN) 320 MG tablet Take 1 tablet (320 mg total) by mouth daily. 90 tablet 1   HYDROcodone-acetaminophen (NORCO/VICODIN) 5-325 MG tablet Take 1 tablet by mouth every 6 (six) hours as needed for moderate pain. (Patient not taking: Reported on 11/24/2018) 10 tablet 0   Current Facility-Administered Medications  Medication Dose Route Frequency Provider Last Rate Last Dose   triamcinolone acetonide (KENALOG) 10 MG/ML injection 10 mg  10 mg Other Once Harriet Masson, DPM        PHYSICAL EXAMINATION: ECOG PERFORMANCE STATUS: 1 - Symptomatic but completely ambulatory  Vitals:   11/24/18 0834  BP: (!) 161/69  Pulse: 62  Resp: 17  Temp: 98.5 F (36.9 C)  SpO2: 100%   Filed Weights   11/24/18 0834  Weight: 148 lb 3.2 oz (67.2 kg)    GENERAL:alert, no distress and comfortable SKIN: skin color, texture, turgor are normal, no rashes or significant lesions EYES: normal, Conjunctiva are pink and non-injected, sclera clear LYMPH:  no palpable lymphadenopathy in the cervical, axillary  LUNGS: clear to auscultation and percussion with normal breathing effort HEART: regular rate & rhythm and no murmurs and no lower extremity edema ABDOMEN:abdomen soft, 3 small incisions have healed well. non-tender and normal bowel sounds Musculoskeletal:no cyanosis of digits and no clubbing  NEURO: alert & oriented x 3 with fluent speech, no focal motor/sensory  deficits  LABORATORY DATA:  I have reviewed the data as listed CBC Latest Ref Rng & Units 11/09/2018 09/22/2018 09/13/2018  WBC 4.0 - 10.5 K/uL 5.6 5.3 6.9  Hemoglobin 12.0 - 15.0 g/dL 11.6(L) 12.7 13.0  Hematocrit 36.0 - 46.0 % 37.1 39.4 42.0  Platelets 150 - 400 K/uL 221 231 216     CMP Latest Ref Rng & Units 11/09/2018 09/22/2018 09/13/2018  Glucose 70 - 99 mg/dL 106(H) 127(H) 83  BUN 8 - 23 mg/dL 19 25(H) 31(H)  Creatinine 0.44 - 1.00 mg/dL 1.40(H) 1.46(H) 1.78(H)  Sodium 135 - 145 mmol/L 142 140 141  Potassium 3.5 - 5.1 mmol/L 3.8 4.0 5.4(H)  Chloride 98 - 111  mmol/L 107 104 106  CO2 22 - 32 mmol/L '26 25 26  '$ Calcium 8.9 - 10.3 mg/dL 9.5 9.5 9.5  Total Protein 6.5 - 8.1 g/dL 6.5 7.2 7.8  Total Bilirubin 0.3 - 1.2 mg/dL 0.8 0.4 0.7  Alkaline Phos 38 - 126 U/L 45 52 52  AST 15 - 41 U/L '19 15 25  '$ ALT 0 - 44 U/L '15 9 13      '$ RADIOGRAPHIC STUDIES: I have personally reviewed the radiological images as listed and agreed with the findings in the report. No results found.   ASSESSMENT & PLAN:  Donna May is a 78 y.o. female with   1. Intrahepatic cholangiocarcinoma, cT1N0M1, with peritoneal metastasis -She presented with mild weight loss, no significant abdominal pain. Biopsy of her liver mass shows adenocarcinoma, most consistent with cholangiocarcinoma -Her EGD/Colonoscopy from 09/23/18 was negative and 09/29/18 PET scan showed no evidence of distant metastasis. -she was brought to OR on 9/3, unfortunately she was found to have peritoneal metastasis to right diaphragm and surgery was aborted. -I discussed her biopsy results from the diaphragm nodule, which showed adenocarcinoma.  Unfortunately she has stage IV disease now, and her cancer is not curable, but treatable. -I discussed the overall poor prognosis, given the rapid growth of her tumor, her advanced age and her medical comorbidities which would limit her systemic treatment options. -She has good performance, and not very  somatic from cancer, I recommend her to consider systemic chemotherapy.  Due to her CKD, she is not a candidate for cisplatin and gemcitabine, which is the standard first-line therapy. I recommend her to try oxaliplatin and gemcitabine. The objective response rate is about 25-30%. The other options of gemcitabine alone, or in combination of Abraxane, FOLFOX etc, were all discussed.  -I have requested foundation 1 genomic testing on her tumor, to see if she has a candidate for targeted therapy or immunotherapy. -We also discussed the option of palliative care alone, given her advanced age.  After lengthy discussion, patient wished to try chemotherapy. --Chemotherapy consent: Side effects including but does not not limited to, fatigue, nausea, vomiting, diarrhea, hair loss, cold sensitivity and neuropathy, fluid retention, renal and kidney dysfunction, neutropenic fever, needed for blood transfusion, bleeding, were discussed with patient in great detail. She agrees to proceed. -The goal of therapy is palliative, to prolong her life. -Due to her advanced age and medical comorbidities, I will reduce chemo dose significantly -plan to start in 1-2 weeks -port placement by Dr. Barry Dienes.  -will get a repeated CT abd/pel wo contrast next week as a new baseline    2. H/o right breast cancer, Genetics   -s/p mastectomy in 1992, per patient did not require adjuvant therapy  -followed by Dr. Jana Hakim  -Her genetic testing was negaitve for patogenitic mutations. She did have VUS of gene APC.   3. DM, HTN, hiatal hernia, GERD, renal artery stenosis  -f/u with PCP -We will monitor her blood pressure and glucose level closely during her chemo.  We discussed that chemotherapy will may impact her blood glucose and blood pressure, and we will adjust her meds as needed   4. Family support  -she has a son and daughter, they were on the phone during her visit today   5. Goal of care discussion  -We again  discussed the incurable nature of her cancer, and the overall poor prognosis, especially if she does not have good response to chemotherapy or progress on chemo -The patient understands the goal  of care is palliative. -she is full code now    PLAN: -port placement by Dr. Barry Dienes -lab, flush, f/u and chemo oxaliplatin in 1 to 2 weeks -CT abdomen and pelvis without contrast next week -Chemo class -FO requested    No problem-specific Assessment & Plan notes found for this encounter.   Orders Placed This Encounter  Procedures   CT Abdomen Pelvis Wo Contrast    Standing Status:   Future    Standing Expiration Date:   11/24/2019    Order Specific Question:   Preferred imaging location?    Answer:   [facility]    Order Specific Question:   Is Oral Contrast requested for this exam?    Answer:   Yes, Per Radiology protocol    Order Specific Question:   Radiology Contrast Protocol - do NOT remove file path    Answer:   [email or web address]   All questions were answered. The patient knows to call the clinic with any problems, questions or concerns. No barriers to learning was detected. I spent 30 minutes counseling the patient face to face. The total time spent in the appointment was 40 minutes and more than 50% was on counseling and review of test results     [name], MD 11/24/2018   I, Joslyn Devon, am acting as scribe for [name], MD.   I have reviewed the above documentation for accuracy and completeness, and I agree with the above.

## 2018-11-24 ENCOUNTER — Telehealth: Payer: Self-pay | Admitting: Hematology

## 2018-11-24 ENCOUNTER — Inpatient Hospital Stay: Payer: Medicare Other | Attending: Adult Health | Admitting: Hematology

## 2018-11-24 ENCOUNTER — Ambulatory Visit: Payer: Medicare Other | Admitting: Cardiovascular Disease

## 2018-11-24 ENCOUNTER — Encounter: Payer: Self-pay | Admitting: Hematology

## 2018-11-24 ENCOUNTER — Other Ambulatory Visit: Payer: Self-pay

## 2018-11-24 VITALS — BP 161/69 | HR 62 | Temp 98.5°F | Resp 17 | Ht 65.0 in | Wt 148.2 lb

## 2018-11-24 DIAGNOSIS — I700 Atherosclerosis of aorta: Secondary | ICD-10-CM | POA: Diagnosis not present

## 2018-11-24 DIAGNOSIS — E1122 Type 2 diabetes mellitus with diabetic chronic kidney disease: Secondary | ICD-10-CM | POA: Diagnosis not present

## 2018-11-24 DIAGNOSIS — Z7189 Other specified counseling: Secondary | ICD-10-CM | POA: Insufficient documentation

## 2018-11-24 DIAGNOSIS — E1142 Type 2 diabetes mellitus with diabetic polyneuropathy: Secondary | ICD-10-CM | POA: Insufficient documentation

## 2018-11-24 DIAGNOSIS — C221 Intrahepatic bile duct carcinoma: Secondary | ICD-10-CM | POA: Diagnosis not present

## 2018-11-24 DIAGNOSIS — I701 Atherosclerosis of renal artery: Secondary | ICD-10-CM | POA: Insufficient documentation

## 2018-11-24 DIAGNOSIS — Z79899 Other long term (current) drug therapy: Secondary | ICD-10-CM | POA: Diagnosis not present

## 2018-11-24 DIAGNOSIS — C786 Secondary malignant neoplasm of retroperitoneum and peritoneum: Secondary | ICD-10-CM | POA: Insufficient documentation

## 2018-11-24 DIAGNOSIS — Z5111 Encounter for antineoplastic chemotherapy: Secondary | ICD-10-CM | POA: Insufficient documentation

## 2018-11-24 DIAGNOSIS — Z853 Personal history of malignant neoplasm of breast: Secondary | ICD-10-CM | POA: Insufficient documentation

## 2018-11-24 DIAGNOSIS — R110 Nausea: Secondary | ICD-10-CM | POA: Insufficient documentation

## 2018-11-24 DIAGNOSIS — I129 Hypertensive chronic kidney disease with stage 1 through stage 4 chronic kidney disease, or unspecified chronic kidney disease: Secondary | ICD-10-CM | POA: Diagnosis not present

## 2018-11-24 DIAGNOSIS — N183 Chronic kidney disease, stage 3 (moderate): Secondary | ICD-10-CM | POA: Insufficient documentation

## 2018-11-24 DIAGNOSIS — Z23 Encounter for immunization: Secondary | ICD-10-CM | POA: Insufficient documentation

## 2018-11-24 DIAGNOSIS — D490 Neoplasm of unspecified behavior of digestive system: Secondary | ICD-10-CM | POA: Diagnosis not present

## 2018-11-24 LAB — TYPE AND SCREEN
ABO/RH(D): A POS
Antibody Screen: NEGATIVE
Unit division: 0
Unit division: 0
Unit division: 0
Unit division: 0
Unit division: 0
Unit division: 0
Unit division: 0
Unit division: 0

## 2018-11-24 LAB — BPAM RBC
Blood Product Expiration Date: 202009192359
Blood Product Expiration Date: 202009192359
Blood Product Expiration Date: 202009192359
Blood Product Expiration Date: 202009192359
Blood Product Expiration Date: 202009262359
Blood Product Expiration Date: 202009262359
Blood Product Expiration Date: 202009262359
Blood Product Expiration Date: 202009262359
ISSUE DATE / TIME: 202008271502
ISSUE DATE / TIME: 202008310606
ISSUE DATE / TIME: 202008310946
ISSUE DATE / TIME: 202008312234
Unit Type and Rh: 6200
Unit Type and Rh: 6200
Unit Type and Rh: 6200
Unit Type and Rh: 6200
Unit Type and Rh: 6200
Unit Type and Rh: 6200
Unit Type and Rh: 6200
Unit Type and Rh: 6200

## 2018-11-24 NOTE — Telephone Encounter (Signed)
Patient received after visit summary and calender, waiting to hear from provider to finish scheduling patient's appointments per 09/10 los.

## 2018-11-24 NOTE — Progress Notes (Signed)
START OFF PATHWAY REGIMEN - Other   [id number]:GemOx q14days (T-cell Lymphoma):   A cycle is every 14 days:     Gemcitabine      Oxaliplatin   **Always confirm dose/schedule in your pharmacy ordering system**  Patient Characteristics: Intent of Therapy: Non-Curative / Palliative Intent, Discussed with Patient

## 2018-11-24 NOTE — Telephone Encounter (Signed)
Called patient regarding upcoming appointments scheduled per 09/10 los, patient is notified and will receive calender In the mail.

## 2018-11-25 ENCOUNTER — Telehealth: Payer: Self-pay

## 2018-11-25 ENCOUNTER — Other Ambulatory Visit: Payer: Self-pay | Admitting: General Surgery

## 2018-11-25 ENCOUNTER — Other Ambulatory Visit: Payer: Self-pay

## 2018-11-25 NOTE — Telephone Encounter (Signed)
Spoke with patient about the CT of abdomen and pelvis that I have scheduled for Tuesday 9/15 at 3:30 at [facility], to arrive by 3:15, NPO for 4 ours prior to scan, drink one bottle of oral prep at 1:30 and the second bottle at 2:30, she will pick up prep the [place] today.  She verbalized an understanding.

## 2018-11-25 NOTE — Progress Notes (Signed)
[place]   Telephone:([phone]) [phone] Fax:(336) [phone]   Clinic Follow up Note   Patient Care Team: [name], MD as PCP - General (Family Medicine) [name], [name] Dad, MD as Consulting Physician (Oncology) [name], [name], MD as Consulting Physician (Cardiology) [name], MD as Consulting Physician (Obstetrics and Gynecology) [name], [name], NP as Nurse Practitioner (Hematology and Oncology) [facility] [name], MD as Consulting Physician (Hematology) [name], [name], MD as Consulting Physician (Gastroenterology) [name], RN as Oncology Nurse Navigator [name], MD as Consulting Physician (General Surgery)  Date of Service:  11/30/2018  CHIEF COMPLAINT: F/u ofcholangiocarcinomaof liver  SUMMARY OF ONCOLOGIC HISTORY: Oncology History  Malignant neoplasm of female breast ([place])  11/06/2018 Genetic Testing   Negative genetic testing on the [facility] Common Hereditary Cancers panel. A variant of uncertain significance was identified in one of her APC genes, called c.1243G>A (p.Ala415Thr).  The Common Hereditary Cancers Panel offered by [facility] includes sequencing and/or deletion duplication testing of the following 48 genes: APC, ATM, AXIN2, BARD1, BMPR1A, BRCA1, BRCA2, BRIP1, CDH1, CDK4, CDKN2A (p14ARF), CDKN2A (p16INK4a), CHEK2, CTNNA1, DICER1, EPCAM (Deletion/duplication testing only), GREM1 (promoter region deletion/duplication testing only), KIT, MEN1, MLH1, MSH2, MSH3, MSH6, MUTYH, NBN, NF1, NHTL1, PALB2, PDGFRA, PMS2, POLD1, POLE, PTEN, RAD50, RAD51C, RAD51D, RNF43, SDHB, SDHC, SDHD, SMAD4, SMARCA4. STK11, TP53, TSC1, TSC2, and VHL.  The following genes were evaluated for sequence changes only: SDHA and HOXB13 c.251G>A variant only.    11/30/2018 -  Chemotherapy   First line chemo oxaliplatin and gemcitabine q2weeks starting 11/30/18.    Intrahepatic cholangiocarcinoma ([place])  09/07/2018 Imaging   CT Chest  IMPRESSION: 1. New, enhancing mass involving segment 4 of the liver and fundus of gallbladder is concerning for malignancy. This may represent either metastatic disease from breast cancer or neoplasm primary to the liver or hepatic biliary tree. Further evaluation with contrast enhanced CT of the abdomen and pelvis is recommended. 2. No findings to suggest metastatic disease within the chest. 3.  Aortic Atherosclerosis (ICD10-I70.0). 4. Coronary artery calcifications.   09/13/2018 Pathology Results   Diagnosis Liver, needle/core biopsy - ADENOCARCINOMA. Microscopic Comment Immunohistochemistry for CK7 is positive. CK20, TTF1, CDX-2, GATA-3, PAX 8, Qualitative ER, p63 and CK5/6 are negative. The provided clinical history of remote mammary carcinoma is noted. Based on the morphology and immunophenotype of the adenocarcinoma observed in this specimen, primary cholangiocarcinoma is favored. Clinical and radiologic correlation are  encouraged. Results reported to [employer] on 09/15/2018. Intradepartmental consultation (Dr. Vic Ripper).   09/13/2018 Initial Diagnosis   Cholangiocarcinoma ([place])   09/23/2018 Procedure   Colonoscopy by Dr. Havery Moros 09/23/18  IMPRESSION - Two 3 to 4 mm polyps in the ascending colon, removed with a cold snare. Resected and retrieved. - Five 3 to 5 mm polyps in the transverse colon, removed with a cold snare. Resected and retrieved. - One 5 mm polyp at the splenic flexure, removed with a cold snare. Resected and retrieved. - Three 3 to 5 mm polyps in the sigmoid colon, removed with a cold snare. Resected and retrieved. - The examination was otherwise normal. Upper Endopscy by Dr. Havery Moros 09/23/18  IMPRESSION - Esophagogastric landmarks identified. - 2 cm hiatal hernia. - Normal esophagus otherwise. - A single gastric polyp. Resected and retrieved. - Mild gastritis. Biopsied. - Normal duodenal bulb and second portion of the duodenum.   09/23/2018 Pathology  Results   Diagnosis 09/23/18 1. Surgical [P], duodenum - BENIGN SMALL BOWEL MUCOSA. - NO ACTIVE INFLAMMATION OR  VILLOUS ATROPHY IDENTIFIED. 2. Surgical [P], stomach, polyp - HYPERPLASTIC POLYP(S). - THERE IS NO EVIDENCE OF MALIGNANCY. 3. Surgical [P], gastric antrum and gastric body - CHRONIC INACTIVE GASTRITIS. - THERE IS NO EVIDENCE OF HELICOBACTER-PYLORI, DYSPLASIA, OR MALIGNANCY. - SEE COMMENT. 4. Surgical [P], colon, sigmoid, splenic flexure, transverse and ascending, polyp (9) - TUBULAR ADENOMA(S). - SESSILE SERRATED POLYP WITHOUT CYTOLOGIC DYSPLASIA. - HIGH GRADE DYSPLASIA IS NOT IDENTIFIED. 5. Surgical [P], colon, sigmoid, polyp (2) - HYPERPLASTIC POLYP(S). - THERE IS NO EVIDENCE OF MALIGNANCY.   09/23/2018 Cancer Staging   Staging form: Intrahepatic Bile Duct, AJCC 8th Edition - Clinical stage from 09/23/2018: Stage IB (cT1b, cN0, cM0) - Signed by [name], MD on 10/06/2018   09/29/2018 PET scan   PET 09/29/18 IMPRESSION: 1. Hypermetabolic mass in the RIGHT hepatic lobe consistent with biopsy proven adenocarcinoma. No additional liver metastasis. 2. No evidence of local breast cancer recurrence in the RIGHT breast or RIGHT axilla. 3. Mild bilateral hypermetabolic adrenal glands is favored benign hyperplasia. 4. No evidence of additional metastatic disease on skull base to thigh FDG PET scan.   11/06/2018 Genetic Testing   Negative genetic testing on the [employer] Common Hereditary Cancers panel. A variant of uncertain significance was identified in one of her APC genes, called c.1243G>A (p.Ala415Thr).  The Common Hereditary Cancers Panel offered by [facility] includes sequencing and/or deletion duplication testing of the following 48 genes: APC, ATM, AXIN2, BARD1, BMPR1A, BRCA1, BRCA2, BRIP1, CDH1, CDK4, CDKN2A (p14ARF), CDKN2A (p16INK4a), CHEK2, CTNNA1, DICER1, EPCAM (Deletion/duplication testing only), GREM1 (promoter region deletion/duplication testing only), KIT, MEN1, MLH1,  MSH2, MSH3, MSH6, MUTYH, NBN, NF1, NHTL1, PALB2, PDGFRA, PMS2, POLD1, POLE, PTEN, RAD50, RAD51C, RAD51D, RNF43, SDHB, SDHC, SDHD, SMAD4, SMARCA4. STK11, TP53, TSC1, TSC2, and VHL.  The following genes were evaluated for sequence changes only: SDHA and HOXB13 c.251G>A variant only.    11/17/2018 Pathology Results   Diagnosis 11/17/18 1. Soft tissue, biopsy, Diaphragmatic nodules - METASTATIC ADENOCARCINOMA, CONSISTENT WITH PATIENT'S CLINICAL HISTORY OF CHOLANGIOCARCINOMA. SEE NOTE 2. Liver, biopsy, Left - LIVER PARENCHYMA WITH A BENIGN FIBROTIC NODULE - NO EVIDENCE OF MALIGNANCY 3. Stomach, biopsy - BENIGN PAPILLARY MESOTHELIAL HYPERPLASIA - NO EVIDENCE OF MALIGNANCY   11/29/2018 Imaging   CT CAP WO Contrast  IMPRESSION: 1. Dominant liver mass appears grossly stable from 09/29/2018. Additional liver lesions are too small to characterize but were not shown to be hypermetabolic on PET. 2. Mild nodularity of both adrenal glands with associated hypermetabolism on 09/29/2018. Continued attention on follow-up exams is warranted. 3. Small right lower lobe nodules, stable from 09/07/2018. Again, attention on follow-up is recommended. 4. Trace bilateral pleural fluid. 5. Aortic atherosclerosis (ICD10-170.0). Coronary artery calcification. 6. Enlarged pulmonic trunk, indicative of pulmonary arterial hypertension.     11/30/2018 -  Chemotherapy   First line chemo oxaliplatin and gemcitabine q2weeks starting 11/30/18.       CURRENT THERAPY:  First line chemo oxaliplatin and gemcitabine q2weeks starting 11/30/18.   INTERVAL HISTORY:  Donna May is here for a follow up and treatment. She presents to the clinic alone. She notes she is doing well. She notes she has support from her daughter and son if needed. She feels overall prepared to start treatment today.    REVIEW OF SYSTEMS:   Constitutional: Denies fevers, chills or abnormal weight loss Eyes: Denies blurriness of vision Ears, nose,  mouth, throat, and face: Denies mucositis or sore throat Respiratory: Denies cough, dyspnea or wheezes Cardiovascular: Denies palpitation, chest discomfort or lower extremity  swelling Gastrointestinal:  Denies nausea, heartburn or change in bowel habits Skin: Denies abnormal skin rashes Lymphatics: Denies new lymphadenopathy or easy bruising Neurological:Denies numbness, tingling or new weaknesses Behavioral/Psych: Mood is stable, no new changes  All other systems were reviewed with the patient and are negative.  MEDICAL HISTORY:  Past Medical History:  Diagnosis Date   Anemia    Anxiety    Arthritis    Breast cancer ([place]) 1992   Cataract    Bilateral eyes - surgery to remove   Chronic kidney disease    CKD stage 3   Depression    Diabetes mellitus    type 2   Duodenitis 01/18/2002   Fainting spell    Family history of breast cancer    Family history of prostate cancer    GERD (gastroesophageal reflux disease)    Heart murmur    never has caused any problems   Hiatal hernia 08/08/2008, 01/18/2002   History of pneumonia    x 2   Hyperlipidemia    Hypertension    Liver cancer ([place]) 08/2018   Lymphedema 2017   Right arm   Pneumonia    x 2   UTI (lower urinary tract infection)     SURGICAL HISTORY: Past Surgical History:  Procedure Laterality Date   AXILLARY SURGERY     cyst removal, right   COLONOSCOPY  09/23/2018   Dr. Havery Moros - polyps   EYE SURGERY Bilateral    cataracts to remove   LAPAROSCOPY N/A 11/17/2018   Procedure: LAPAROSCOPY DIAGNOSTIC, INTRAOPERATIVE ULTRASOUND, PERITONEAL BIOPSIES;  Surgeon: [name], MD;  Location: [place];  Service: General;  Laterality: N/A;  GENERAL AND EPIDURAL   LIVER BIOPSY  08/2018   Dr. Lindwood Coke   MASTECTOMY  1992   right, with flap   NM MYOCAR PERF WALL MOTION  06/11/2009   Protocol:[place], post stress EF58%, EKG negative for ischemia, low risk   RECONSTRUCTION BREAST W/ TRAM FLAP Right     TONSILLECTOMY     TRANSTHORACIC ECHOCARDIOGRAM  12/24/2009   LVEF =>55%, normal study   UPPER GI ENDOSCOPY      I have reviewed the social history and family history with the patient and they are unchanged from previous note.  ALLERGIES:  has No Known Allergies.  MEDICATIONS:  Current Outpatient Medications  Medication Sig Dispense Refill   amLODipine (NORVASC) 10 MG tablet TAKE 1 TABLET BY MOUTH EVERY DAY (Patient taking differently: Take 10 mg by mouth daily. ) 90 tablet 2   aspirin 81 MG tablet Take 81 mg by mouth at bedtime.      CALCIUM-MAGNESIUM-VITAMIN D PO Take 1 tablet by mouth once a week.      Cholecalciferol (VITAMIN D3) 2000 UNITS TABS Take 1 tablet by mouth once a week.      Echinacea 400 MG CAPS Take 400 mg by mouth daily.      glucose blood (ACCU-CHEK AVIVA) test strip Use as instructed 100 each 4   hydrochlorothiazide (HYDRODIURIL) 25 MG tablet Take 25 mg by mouth daily.     HYDROcodone-acetaminophen (NORCO/VICODIN) 5-325 MG tablet Take 1 tablet by mouth every 6 (six) hours as needed for moderate pain. 10 tablet 0   ketoconazole (NIZORAL) 2 % cream Apply 1 fingertip amount to each foot daily. (Patient taking differently: Apply 1 application topically daily as needed (affected area of foot). Apply 1 fingertip amount to each foot daily.) 30 g 0   lansoprazole (PREVACID) 15 MG capsule TAKE 1 CAPSULE DAILY  BEFOREBREAKFAST (NEED TO MAKE AN OFFICE VISIT FOR FURTHER   REFILLS) (Patient taking differently: Take 15 mg by mouth daily. ) 90 capsule 1   lidocaine-prilocaine (EMLA) cream Apply 1 application topically as needed. 30 g 0   lidocaine-prilocaine (EMLA) cream Apply to affected area once 30 g 3   loratadine (CLARITIN) 10 MG tablet Take 10 mg by mouth daily as needed for allergies.     metFORMIN (GLUCOPHAGE-XR) 500 MG 24 hr tablet Take 1 tablet (500 mg total) by mouth 2 (two) times daily before a meal. 180 tablet 1   metoprolol succinate (TOPROL-XL) 100 MG 24  hr tablet TAKE 1 TABLET (100 MG TOTAL) BY MOUTH DAILY. TAKE WITH OR IMMEDIATELY FOLLOWING A MEAL. 90 tablet 1   Multiple Vitamins-Minerals (CENTRUM SILVER ULTRA WOMENS) TABS Take 1 tablet by mouth once a week.      Omega 3 1000 MG CAPS Take 2,000 mg by mouth daily.      ondansetron (ZOFRAN) 8 MG tablet Take 1 tablet (8 mg total) by mouth every 8 (eight) hours as needed for nausea or vomiting. 30 tablet 0   prochlorperazine (COMPAZINE) 10 MG tablet Take 1 tablet (10 mg total) by mouth every 6 (six) hours as needed for nausea or vomiting. 30 tablet 0   raloxifene (EVISTA) 60 MG tablet TAKE 1 TABLET DAILY (Patient taking differently: Take 60 mg by mouth daily. ) 90 tablet 0   rosuvastatin (CRESTOR) 20 MG tablet Take 1 tablet (20 mg total) by mouth daily. (Patient taking differently: Take 20 mg by mouth at bedtime. ) 90 tablet 0   sitaGLIPtin (JANUVIA) 100 MG tablet Take 1 tablet (100 mg total) by mouth daily. 90 tablet 4   triamcinolone cream (KENALOG) 0.1 % Apply 1 application topically 2 (two) times daily. (Patient taking differently: Apply 1 application topically 2 (two) times daily as needed (rash). ) 30 g 0   valsartan (DIOVAN) 320 MG tablet Take 1 tablet (320 mg total) by mouth daily. 90 tablet 1   Current Facility-Administered Medications  Medication Dose Route Frequency Provider Last Rate Last Dose   triamcinolone acetonide (KENALOG) 10 MG/ML injection 10 mg  10 mg Other Once Harriet Masson, DPM        PHYSICAL EXAMINATION: ECOG PERFORMANCE STATUS: 0 - Asymptomatic  Vitals:   11/30/18 1038  BP: (!) 152/57  Pulse: 69  Resp: 17  Temp: 98.3 F (36.8 C)  SpO2: 99%   Filed Weights   11/30/18 1038  Weight: 150 lb 6.4 oz (68.2 kg)    GENERAL:alert, no distress and comfortable SKIN: skin color, texture, turgor are normal, no rashes or significant lesions EYES: normal, Conjunctiva are pink and non-injected, sclera clear  NECK: supple, thyroid normal size, non-tender,  without nodularity LYMPH:  no palpable lymphadenopathy in the cervical, axillary  LUNGS: clear to auscultation and percussion with normal breathing effort HEART: regular rate & rhythm and no murmurs and no lower extremity edema ABDOMEN:abdomen soft, non-tender and normal bowel sounds, laparoscopic incisions have healed very well. Musculoskeletal:no cyanosis of digits and no clubbing  NEURO: alert & oriented x 3 with fluent speech, no focal motor/sensory deficits  LABORATORY DATA:  I have reviewed the data as listed CBC Latest Ref Rng & Units 11/30/2018 11/09/2018 09/22/2018  WBC 4.0 - 10.5 K/uL 6.6 5.6 5.3  Hemoglobin 12.0 - 15.0 g/dL 10.8(L) 11.6(L) 12.7  Hematocrit 36.0 - 46.0 % 34.7(L) 37.1 39.4  Platelets 150 - 400 K/uL 309 221 231  CMP Latest Ref Rng & Units 11/30/2018 11/09/2018 09/22/2018  Glucose 70 - 99 mg/dL 139(H) 106(H) 127(H)  BUN 8 - 23 mg/dL 19 19 25(H)  Creatinine 0.44 - 1.00 mg/dL 1.34(H) 1.40(H) 1.46(H)  Sodium 135 - 145 mmol/L 140 142 140  Potassium 3.5 - 5.1 mmol/L 3.9 3.8 4.0  Chloride 98 - 111 mmol/L 104 107 104  CO2 22 - 32 mmol/L '28 26 25  '$ Calcium 8.9 - 10.3 mg/dL 8.7(L) 9.5 9.5  Total Protein 6.5 - 8.1 g/dL 6.4(L) 6.5 7.2  Total Bilirubin 0.3 - 1.2 mg/dL 0.3 0.8 0.4  Alkaline Phos 38 - 126 U/L 52 45 52  AST 15 - 41 U/L 13(L) 19 15  ALT 0 - 44 U/L '7 15 9      '$ RADIOGRAPHIC STUDIES: I have personally reviewed the radiological images as listed and agreed with the findings in the report. Ct Abdomen Pelvis Wo Contrast  Result Date: 11/30/2018 CLINICAL DATA:  Liver/biliary cancer.  History of breast cancer. EXAM: CT ABDOMEN AND PELVIS WITHOUT CONTRAST TECHNIQUE: Multidetector CT imaging of the abdomen and pelvis was performed following the standard protocol without IV contrast. COMPARISON:  PET 09/29/2018, CT chest 09/07/2018. FINDINGS: Lower chest: Right lower lobe nodules measure up to 5 mm, stable. Trace pleural fluid bilaterally. Atherosclerotic calcification  of the aorta and coronary arteries. Pulmonic trunk is enlarged. Heart size normal. No pericardial effusion. Hepatobiliary: Hypodense lesions are seen in both lobes of the liver with a dominant mass in segment 4, measuring 4.1 x 8.9 cm (2/24), increased from 3.8 x 6.0 cm on 09/29/2018. Gallbladder is unremarkable. No biliary ductal dilatation. Pancreas: Negative. Spleen: Negative. Adrenals/Urinary Tract: Slight nodular thickening of the adrenal glands. Low-attenuation lesions in the kidneys measure up to 2.6 cm on the left and are likely cysts. Ureters are decompressed. Bladder is grossly unremarkable. Stomach/Bowel: Stomach, small bowel, appendix and colon are unremarkable. Vascular/Lymphatic: Atherosclerotic calcification of the aorta without aneurysm. No pathologically enlarged lymph nodes. Reproductive: Uterus is visualized and likely contains calcified fibroids. Fat containing right ovarian lesion measures 4.1 cm, as before, indicative of a dermoid. Other: No free fluid. Small bilateral inguinal hernias contain fat. Mesenteries and peritoneum are otherwise unremarkable. Musculoskeletal: Degenerative changes in the spine. No worrisome lytic or sclerotic lesions. IMPRESSION: 1. Dominant liver mass appears grossly stable from 09/29/2018. Additional liver lesions are too small to characterize but were not shown to be hypermetabolic on PET. 2. Mild nodularity of both adrenal glands with associated hypermetabolism on 09/29/2018. Continued attention on follow-up exams is warranted. 3. Small right lower lobe nodules, stable from 09/07/2018. Again, attention on follow-up is recommended. 4. Trace bilateral pleural fluid. 5. Aortic atherosclerosis (ICD10-170.0). Coronary artery calcification. 6. Enlarged pulmonic trunk, indicative of pulmonary arterial hypertension. Electronically Signed   By: Lorin Picket M.D.   On: 11/30/2018 10:54     ASSESSMENT & PLAN:  Donna May is a 78 y.o. female with   1.Intrahepatic  cholangiocarcinoma, cT1N0M1, with peritoneal metastasis -She presented with mild weight loss, no significant abdominal pain. Biopsy of her liver mass shows adenocarcinoma, most consistent with cholangiocarcinoma -Her EGD/Colonoscopy from 09/23/18 was negative and 09/29/18 PET scan showed no evidence of distant metastasis. -She was brought to OR on 9/3, unfortunately she was found to have peritoneal metastasis to right diaphragm and surgery was aborted. -She has good performance, and not very somatic from cancer, I recommend her to consider systemic chemotherapy. Due to her CKD, she is not a candidate for cisplatin and gemcitabine,  which is the standard first-line therapy. I discussed options such as oxaliplatin and gemcitabine every 2 weeks, the goal of therapy is palliative.  Chemo consent was obtained from last visit. -Her FO results are still pending to determine if she is eligible for target or immunotherapy.  -I personally reviewed and discussed CT AP from 11/29/18 shows dominate liver mass stable to mild increase, mild nodularity of both adrenal glands which warrants being monitored.  No visible peritoneal metastasis on CT scan. -Labs reviewed, CBC and CMP WNL except Hg 10.8, BG 139, Cr 1.34, Ca 8.7. Overall adequate to start Oxaliplatin and Gemcitabine today. Due to her advanced age and medical comorbidities, I will reduce oxaliplatin dose significantly. I reviewed side effects with her again.  -Will have port placement by Dr. Barry Dienes on 12/02/18.  -f/u next week with NP Lacie for toxicity check   2. H/o right breast cancer, Genetics -s/p mastectomy in 1992, per patient did not require adjuvant therapy  -followed by Dr. Jana Hakim -Her genetic testing was negative for pathogenetic mutations. She did have VUS of gene APC.   3. Comorbidities: DM, HTN, hiatal hernia, GERD, renal artery stenosis  -f/u with PCP -We will monitor her blood pressure and glucose level closely during her chemo.  We  discussed that chemotherapy will may impact her blood glucose and blood pressure, and we will adjust her meds as needed  -Cr at 1.34 today (11/30/18)  4. Family support  -she has a son and daughter,they were on the phone during her visit today   5. Goal of care discussion  -We previously discussed the incurable nature of her cancer, and the overall poor prognosis, especially if she does not have good response to chemotherapy or progress on chemo -The patient understands the goal of care is palliative. -she is full code now    PLAN: -Port placement by Dr. Barry Dienes on 9/18 -Labs reviewed and adequate to proceed with C1 Oxaliplatin with dose reduction to '85mg'$ /m2 and Gemcitabine '1000mg'$ /m2  today  -Lab and f/u with NP Lacie next week for toxicity checkup -Lab, flush, f/u and cycle 2 chemo in 2 weeks    No problem-specific Assessment & Plan notes found for this encounter.   No orders of the defined types were placed in this encounter.  All questions were answered. The patient knows to call the clinic with any problems, questions or concerns. No barriers to learning was detected. I spent 20 minutes counseling the patient face to face. The total time spent in the appointment was 25 minutes and more than 50% was on counseling and review of test results     [name], MD 11/30/2018   I, Joslyn Devon, am acting as scribe for [name], MD.   I have reviewed the above documentation for accuracy and completeness, and I agree with the above.

## 2018-11-28 ENCOUNTER — Other Ambulatory Visit: Payer: Self-pay

## 2018-11-28 ENCOUNTER — Other Ambulatory Visit: Payer: Self-pay | Admitting: Hematology

## 2018-11-28 ENCOUNTER — Inpatient Hospital Stay: Payer: Medicare Other

## 2018-11-28 ENCOUNTER — Other Ambulatory Visit: Payer: Medicare Other

## 2018-11-28 DIAGNOSIS — C221 Intrahepatic bile duct carcinoma: Secondary | ICD-10-CM

## 2018-11-28 MED ORDER — ONDANSETRON HCL 8 MG PO TABS: 8.0000 mg | ORAL_TABLET | Freq: Three times a day (TID) | ORAL | 0 refills | Status: DC | PRN

## 2018-11-28 MED ORDER — PROCHLORPERAZINE MALEATE 10 MG PO TABS: 10.0000 mg | ORAL_TABLET | Freq: Four times a day (QID) | ORAL | 0 refills | Status: DC | PRN

## 2018-11-29 ENCOUNTER — Other Ambulatory Visit: Payer: Self-pay

## 2018-11-29 ENCOUNTER — Other Ambulatory Visit (HOSPITAL_COMMUNITY)
Admission: RE | Admit: 2018-11-29 | Discharge: 2018-11-29 | Disposition: A | Discharge status: Home / Self Care | Payer: Medicare Other | Source: Ambulatory Visit | Attending: General Surgery | Admitting: General Surgery

## 2018-11-29 ENCOUNTER — Ambulatory Visit (HOSPITAL_COMMUNITY)
Admission: RE | Admit: 2018-11-29 | Discharge: 2018-11-29 | Disposition: A | Discharge status: Home / Self Care | Payer: Medicare Other | Source: Ambulatory Visit | Attending: Hematology | Admitting: Hematology

## 2018-11-29 DIAGNOSIS — I2510 Atherosclerotic heart disease of native coronary artery without angina pectoris: Secondary | ICD-10-CM | POA: Insufficient documentation

## 2018-11-29 DIAGNOSIS — Z01812 Encounter for preprocedural laboratory examination: Secondary | ICD-10-CM | POA: Diagnosis not present

## 2018-11-29 DIAGNOSIS — Z20828 Contact with and (suspected) exposure to other viral communicable diseases: Secondary | ICD-10-CM | POA: Diagnosis not present

## 2018-11-29 DIAGNOSIS — R160 Hepatomegaly, not elsewhere classified: Secondary | ICD-10-CM | POA: Diagnosis not present

## 2018-11-29 DIAGNOSIS — I700 Atherosclerosis of aorta: Secondary | ICD-10-CM | POA: Diagnosis not present

## 2018-11-29 DIAGNOSIS — K769 Liver disease, unspecified: Secondary | ICD-10-CM | POA: Diagnosis not present

## 2018-11-29 DIAGNOSIS — C221 Intrahepatic bile duct carcinoma: Secondary | ICD-10-CM | POA: Diagnosis not present

## 2018-11-29 DIAGNOSIS — D490 Neoplasm of unspecified behavior of digestive system: Secondary | ICD-10-CM | POA: Diagnosis not present

## 2018-11-29 MED ORDER — LIDOCAINE-PRILOCAINE 2.5-2.5 % EX CREA: 1.0000 "application " | TOPICAL_CREAM | CUTANEOUS | 0 refills | Status: DC | PRN

## 2018-11-30 ENCOUNTER — Other Ambulatory Visit: Payer: Self-pay

## 2018-11-30 ENCOUNTER — Other Ambulatory Visit: Payer: Medicare Other

## 2018-11-30 ENCOUNTER — Inpatient Hospital Stay: Payer: Medicare Other

## 2018-11-30 ENCOUNTER — Ambulatory Visit: Payer: Medicare Other | Admitting: Endocrinology

## 2018-11-30 ENCOUNTER — Encounter: Payer: Self-pay | Admitting: Hematology

## 2018-11-30 ENCOUNTER — Inpatient Hospital Stay (HOSPITAL_BASED_OUTPATIENT_CLINIC_OR_DEPARTMENT_OTHER): Payer: Medicare Other | Admitting: Medical

## 2018-11-30 ENCOUNTER — Inpatient Hospital Stay (HOSPITAL_BASED_OUTPATIENT_CLINIC_OR_DEPARTMENT_OTHER): Payer: Medicare Other | Admitting: Hematology

## 2018-11-30 ENCOUNTER — Ambulatory Visit: Payer: Medicare Other

## 2018-11-30 VITALS — BP 152/57 | HR 69 | Temp 98.3°F | Resp 17 | Ht 65.0 in | Wt 150.4 lb

## 2018-11-30 DIAGNOSIS — C221 Intrahepatic bile duct carcinoma: Secondary | ICD-10-CM

## 2018-11-30 DIAGNOSIS — E0822 Diabetes mellitus due to underlying condition with diabetic chronic kidney disease: Secondary | ICD-10-CM | POA: Diagnosis not present

## 2018-11-30 DIAGNOSIS — I129 Hypertensive chronic kidney disease with stage 1 through stage 4 chronic kidney disease, or unspecified chronic kidney disease: Secondary | ICD-10-CM | POA: Diagnosis not present

## 2018-11-30 DIAGNOSIS — N183 Chronic kidney disease, stage 3 (moderate): Secondary | ICD-10-CM | POA: Diagnosis not present

## 2018-11-30 DIAGNOSIS — C786 Secondary malignant neoplasm of retroperitoneum and peritoneum: Secondary | ICD-10-CM | POA: Diagnosis not present

## 2018-11-30 DIAGNOSIS — Z5111 Encounter for antineoplastic chemotherapy: Secondary | ICD-10-CM | POA: Diagnosis not present

## 2018-11-30 DIAGNOSIS — Z23 Encounter for immunization: Secondary | ICD-10-CM | POA: Diagnosis not present

## 2018-11-30 LAB — CBC WITH DIFFERENTIAL (CANCER CENTER ONLY)
Abs Immature Granulocytes: 0.01 10*3/uL (ref 0.00–0.07)
Basophils Absolute: 0 10*3/uL (ref 0.0–0.1)
Basophils Relative: 0 %
Eosinophils Absolute: 0.2 10*3/uL (ref 0.0–0.5)
Eosinophils Relative: 2 %
HCT: 34.7 % — ABNORMAL LOW (ref 36.0–46.0)
Hemoglobin: 10.8 g/dL — ABNORMAL LOW (ref 12.0–15.0)
Immature Granulocytes: 0 %
Lymphocytes Relative: 26 %
Lymphs Abs: 1.7 10*3/uL (ref 0.7–4.0)
MCH: 28.5 pg (ref 26.0–34.0)
MCHC: 31.1 g/dL (ref 30.0–36.0)
MCV: 91.6 fL (ref 80.0–100.0)
Monocytes Absolute: 0.5 10*3/uL (ref 0.1–1.0)
Monocytes Relative: 7 %
Neutro Abs: 4.2 10*3/uL (ref 1.7–7.7)
Neutrophils Relative %: 65 %
Platelet Count: 309 10*3/uL (ref 150–400)
RBC: 3.79 MIL/uL — ABNORMAL LOW (ref 3.87–5.11)
RDW: 15.8 % — ABNORMAL HIGH (ref 11.5–15.5)
WBC Count: 6.6 10*3/uL (ref 4.0–10.5)
nRBC: 0 % (ref 0.0–0.2)

## 2018-11-30 LAB — CMP (CANCER CENTER ONLY)
ALT: 7 U/L (ref 0–44)
AST: 13 U/L — ABNORMAL LOW (ref 15–41)
Albumin: 3.4 g/dL — ABNORMAL LOW (ref 3.5–5.0)
Alkaline Phosphatase: 52 U/L (ref 38–126)
Anion gap: 8 (ref 5–15)
BUN: 19 mg/dL (ref 8–23)
CO2: 28 mmol/L (ref 22–32)
Calcium: 8.7 mg/dL — ABNORMAL LOW (ref 8.9–10.3)
Chloride: 104 mmol/L (ref 98–111)
Creatinine: 1.34 mg/dL — ABNORMAL HIGH (ref 0.44–1.00)
GFR, Est AFR Am: 44 mL/min — ABNORMAL LOW (ref >60)
GFR, Estimated: 38 mL/min — ABNORMAL LOW (ref >60)
Glucose, Bld: 139 mg/dL — ABNORMAL HIGH (ref 70–99)
Potassium: 3.9 mmol/L (ref 3.5–5.1)
Sodium: 140 mmol/L (ref 135–145)
Total Bilirubin: 0.3 mg/dL (ref 0.3–1.2)
Total Protein: 6.4 g/dL — ABNORMAL LOW (ref 6.5–8.1)

## 2018-11-30 LAB — NOVEL CORONAVIRUS, NAA (HOSP ORDER, SEND-OUT TO REF LAB; TAT 18-24 HRS): SARS-CoV-2, NAA: NOT DETECTED

## 2018-11-30 MED ORDER — DEXTROSE 5 % IV SOLN
Freq: Once | INTRAVENOUS | Status: AC
  Administered 2018-11-30: 12:00:00 via INTRAVENOUS
  Filled 2018-11-30: qty 250

## 2018-11-30 MED ORDER — PALONOSETRON HCL INJECTION 0.25 MG/5ML
INTRAVENOUS | Status: AC
  Filled 2018-11-30: qty 5

## 2018-11-30 MED ORDER — DEXAMETHASONE SODIUM PHOSPHATE 10 MG/ML IJ SOLN
10.0000 mg | Freq: Once | INTRAMUSCULAR | Status: AC
  Administered 2018-11-30: 12:00:00 10 mg via INTRAVENOUS

## 2018-11-30 MED ORDER — SODIUM CHLORIDE 0.9 % IV SOLN: 10.0000 mg | Freq: Once | INTRAVENOUS | Status: DC

## 2018-11-30 MED ORDER — OXALIPLATIN CHEMO INJECTION 100 MG/20ML
85.0000 mg/m2 | Freq: Once | INTRAVENOUS | Status: AC
  Administered 2018-11-30: 150 mg via INTRAVENOUS
  Filled 2018-11-30: qty 20

## 2018-11-30 MED ORDER — DEXAMETHASONE SODIUM PHOSPHATE 10 MG/ML IJ SOLN
INTRAMUSCULAR | Status: AC
  Filled 2018-11-30: qty 1

## 2018-11-30 MED ORDER — LIDOCAINE-PRILOCAINE 2.5-2.5 % EX CREA: TOPICAL_CREAM | CUTANEOUS | 3 refills | Status: DC

## 2018-11-30 MED ORDER — SODIUM CHLORIDE 0.9 % IV SOLN
Freq: Once | INTRAVENOUS | Status: AC
  Administered 2018-11-30: 13:00:00 via INTRAVENOUS
  Filled 2018-11-30: qty 250

## 2018-11-30 MED ORDER — PALONOSETRON HCL INJECTION 0.25 MG/5ML
0.2500 mg | Freq: Once | INTRAVENOUS | Status: AC
  Administered 2018-11-30: 0.25 mg via INTRAVENOUS

## 2018-11-30 MED ORDER — SODIUM CHLORIDE 0.9 % IV SOLN
1000.0000 mg/m2 | Freq: Once | INTRAVENOUS | Status: AC
  Administered 2018-11-30: 1748 mg via INTRAVENOUS
  Filled 2018-11-30: qty 45.97

## 2018-11-30 NOTE — Patient Instructions (Signed)
[facility] Discharge Instructions for Patients Receiving Chemotherapy  Today you received the following chemotherapy agents Gemzar and Oxaliplatin   To help prevent nausea and vomiting after your treatment, we encourage you to take your nausea medication as directed. No Zofran for 3 days, take Compazine instead.   If you develop nausea and vomiting that is not controlled by your nausea medication, call the clinic.   BELOW ARE SYMPTOMS THAT SHOULD BE REPORTED IMMEDIATELY:  *FEVER GREATER THAN 100.5 F  *CHILLS WITH OR WITHOUT FEVER  NAUSEA AND VOMITING THAT IS NOT CONTROLLED WITH YOUR NAUSEA MEDICATION  *UNUSUAL SHORTNESS OF BREATH  *UNUSUAL BRUISING OR BLEEDING  TENDERNESS IN MOUTH AND THROAT WITH OR WITHOUT PRESENCE OF ULCERS  *URINARY PROBLEMS  *BOWEL PROBLEMS  UNUSUAL RASH Items with * indicate a potential emergency and should be followed up as soon as possible.  Feel free to call the clinic should you have any questions or concerns. The clinic phone number is [phone].  Please show the [facility] at check-in to the Emergency Department and triage nurse.  Gemcitabine injection What is this medicine? GEMCITABINE (jem SYE ta been) is a chemotherapy drug. This medicine is used to treat many types of cancer like breast cancer, lung cancer, pancreatic cancer, and ovarian cancer. This medicine may be used for other purposes; ask your health care provider or pharmacist if you have questions. COMMON BRAND NAME(S): Gemzar, Infugem What should I tell my health care provider before I take this medicine? They need to know if you have any of these conditions:  blood disorders  infection  kidney disease  liver disease  lung or breathing disease, like asthma  recent or ongoing radiation therapy  an unusual or allergic reaction to gemcitabine, other chemotherapy, other medicines, foods, dyes, or preservatives  pregnant or trying to get  pregnant  breast-feeding How should I use this medicine? This drug is given as an infusion into a vein. It is administered in a hospital or clinic by a specially trained health care professional. Talk to your pediatrician regarding the use of this medicine in children. Special care may be needed. Overdosage: If you think you have taken too much of this medicine contact a poison control center or emergency room at once. NOTE: This medicine is only for you. Do not share this medicine with others. What if I miss a dose? It is important not to miss your dose. Call your doctor or health care professional if you are unable to keep an appointment. What may interact with this medicine?  medicines to increase blood counts like filgrastim, pegfilgrastim, sargramostim  some other chemotherapy drugs like cisplatin  vaccines Talk to your doctor or health care professional before taking any of these medicines:  acetaminophen  aspirin  ibuprofen  ketoprofen  naproxen This list may not describe all possible interactions. Give your health care provider a list of all the medicines, herbs, non-prescription drugs, or dietary supplements you use. Also tell them if you smoke, drink alcohol, or use illegal drugs. Some items may interact with your medicine. What should I watch for while using this medicine? Visit your doctor for checks on your progress. This drug may make you feel generally unwell. This is not uncommon, as chemotherapy can affect healthy cells as well as cancer cells. Report any side effects. Continue your course of treatment even though you feel ill unless your doctor tells you to stop. In some cases, you may be given additional medicines to  help with side effects. Follow all directions for their use. Call your doctor or health care professional for advice if you get a fever, chills or sore throat, or other symptoms of a cold or flu. Do not treat yourself. This drug decreases your body's  ability to fight infections. Try to avoid being around people who are sick. This medicine may increase your risk to bruise or bleed. Call your doctor or health care professional if you notice any unusual bleeding. Be careful brushing and flossing your teeth or using a toothpick because you may get an infection or bleed more easily. If you have any dental work done, tell your dentist you are receiving this medicine. Avoid taking products that contain aspirin, acetaminophen, ibuprofen, naproxen, or ketoprofen unless instructed by your doctor. These medicines may hide a fever. Do not become pregnant while taking this medicine or for 6 months after stopping it. Women should inform their doctor if they wish to become pregnant or think they might be pregnant. Men should not father a child while taking this medicine and for 3 months after stopping it. There is a potential for serious side effects to an unborn child. Talk to your health care professional or pharmacist for more information. Do not breast-feed an infant while taking this medicine or for at least 1 week after stopping it. Men should inform their doctors if they wish to father a child. This medicine may lower sperm counts. Talk with your doctor or health care professional if you are concerned about your fertility. What side effects may I notice from receiving this medicine? Side effects that you should report to your doctor or health care professional as soon as possible:  allergic reactions like skin rash, itching or hives, swelling of the face, lips, or tongue  breathing problems  pain, redness, or irritation at site where injected  signs and symptoms of a dangerous change in heartbeat or heart rhythm like chest pain; dizziness; fast or irregular heartbeat; palpitations; feeling faint or lightheaded, falls; breathing problems  signs of decreased platelets or bleeding - bruising, pinpoint red spots on the skin, black, tarry stools, blood in  the urine  signs of decreased red blood cells - unusually weak or tired, feeling faint or lightheaded, falls  signs of infection - fever or chills, cough, sore throat, pain or difficulty passing urine  signs and symptoms of kidney injury like trouble passing urine or change in the amount of urine  signs and symptoms of liver injury like dark yellow or brown urine; general ill feeling or flu-like symptoms; light-colored stools; loss of appetite; nausea; right upper belly pain; unusually weak or tired; yellowing of the eyes or skin  swelling of ankles, feet, hands Side effects that usually do not require medical attention (report to your doctor or health care professional if they continue or are bothersome):  constipation  diarrhea  hair loss  loss of appetite  nausea  rash  vomiting This list may not describe all possible side effects. Call your doctor for medical advice about side effects. You may report side effects to FDA at 1-800-FDA-1088. Where should I keep my medicine? This drug is given in a hospital or clinic and will not be stored at home. NOTE: This sheet is a summary. It may not cover all possible information. If you have questions about this medicine, talk to your doctor, pharmacist, or health care provider.  2020 Elsevier/Gold Standard (2017-05-26 18:06:11)  Oxaliplatin Injection What is this medicine? OXALIPLATIN (ox AL  i PLA tin) is a chemotherapy drug. It targets fast dividing cells, like cancer cells, and causes these cells to die. This medicine is used to treat cancers of the colon and rectum, and many other cancers. This medicine may be used for other purposes; ask your health care provider or pharmacist if you have questions. COMMON BRAND NAME(S): Eloxatin What should I tell my health care provider before I take this medicine? They need to know if you have any of these conditions:  kidney disease  an unusual or allergic reaction to oxaliplatin, other  chemotherapy, other medicines, foods, dyes, or preservatives  pregnant or trying to get pregnant  breast-feeding How should I use this medicine? This drug is given as an infusion into a vein. It is administered in a hospital or clinic by a specially trained health care professional. Talk to your pediatrician regarding the use of this medicine in children. Special care may be needed. Overdosage: If you think you have taken too much of this medicine contact a poison control center or emergency room at once. NOTE: This medicine is only for you. Do not share this medicine with others. What if I miss a dose? It is important not to miss a dose. Call your doctor or health care professional if you are unable to keep an appointment. What may interact with this medicine?  medicines to increase blood counts like filgrastim, pegfilgrastim, sargramostim  probenecid  some antibiotics like amikacin, gentamicin, neomycin, polymyxin B, streptomycin, tobramycin  zalcitabine Talk to your doctor or health care professional before taking any of these medicines:  acetaminophen  aspirin  ibuprofen  ketoprofen  naproxen This list may not describe all possible interactions. Give your health care provider a list of all the medicines, herbs, non-prescription drugs, or dietary supplements you use. Also tell them if you smoke, drink alcohol, or use illegal drugs. Some items may interact with your medicine. What should I watch for while using this medicine? Your condition will be monitored carefully while you are receiving this medicine. You will need important blood work done while you are taking this medicine. This medicine can make you more sensitive to cold. Do not drink cold drinks or use ice. Cover exposed skin before coming in contact with cold temperatures or cold objects. When out in cold weather wear warm clothing and cover your mouth and nose to warm the air that goes into your lungs. Tell your  doctor if you get sensitive to the cold. This drug may make you feel generally unwell. This is not uncommon, as chemotherapy can affect healthy cells as well as cancer cells. Report any side effects. Continue your course of treatment even though you feel ill unless your doctor tells you to stop. In some cases, you may be given additional medicines to help with side effects. Follow all directions for their use. Call your doctor or health care professional for advice if you get a fever, chills or sore throat, or other symptoms of a cold or flu. Do not treat yourself. This drug decreases your body's ability to fight infections. Try to avoid being around people who are sick. This medicine may increase your risk to bruise or bleed. Call your doctor or health care professional if you notice any unusual bleeding. Be careful brushing and flossing your teeth or using a toothpick because you may get an infection or bleed more easily. If you have any dental work done, tell your dentist you are receiving this medicine. Avoid taking products that  contain aspirin, acetaminophen, ibuprofen, naproxen, or ketoprofen unless instructed by your doctor. These medicines may hide a fever. Do not become pregnant while taking this medicine. Women should inform their doctor if they wish to become pregnant or think they might be pregnant. There is a potential for serious side effects to an unborn child. Talk to your health care professional or pharmacist for more information. Do not breast-feed an infant while taking this medicine. Call your doctor or health care professional if you get diarrhea. Do not treat yourself. What side effects may I notice from receiving this medicine? Side effects that you should report to your doctor or health care professional as soon as possible:  allergic reactions like skin rash, itching or hives, swelling of the face, lips, or tongue  low blood counts - This drug may decrease the number of white  blood cells, red blood cells and platelets. You may be at increased risk for infections and bleeding.  signs of infection - fever or chills, cough, sore throat, pain or difficulty passing urine  signs of decreased platelets or bleeding - bruising, pinpoint red spots on the skin, black, tarry stools, nosebleeds  signs of decreased red blood cells - unusually weak or tired, fainting spells, lightheadedness  breathing problems  chest pain, pressure  cough  diarrhea  jaw tightness  mouth sores  nausea and vomiting  pain, swelling, redness or irritation at the injection site  pain, tingling, numbness in the hands or feet  problems with balance, talking, walking  redness, blistering, peeling or loosening of the skin, including inside the mouth  trouble passing urine or change in the amount of urine Side effects that usually do not require medical attention (report to your doctor or health care professional if they continue or are bothersome):  changes in vision  constipation  hair loss  loss of appetite  metallic taste in the mouth or changes in taste  stomach pain This list may not describe all possible side effects. Call your doctor for medical advice about side effects. You may report side effects to FDA at 1-800-FDA-1088. Where should I keep my medicine? This drug is given in a hospital or clinic and will not be stored at home. NOTE: This sheet is a summary. It may not cover all possible information. If you have questions about this medicine, talk to your doctor, pharmacist, or health care provider.  2020 Elsevier/Gold Standard (2007-09-27 17:22:47)

## 2018-11-30 NOTE — Progress Notes (Signed)
Donna May was seen at the reception desk for extra COVID-19 screening. She is scheduled for upcoming surgery/procedure. She was screened prior to this event. Her results are pending. ALONIA DIBUONO is without symptoms. She was cleared to proceed with her appointment today.  Sandi Mealy, MHS, PA-C Physician Assistant

## 2018-12-01 ENCOUNTER — Encounter (HOSPITAL_COMMUNITY): Payer: Self-pay | Admitting: *Deleted

## 2018-12-01 ENCOUNTER — Telehealth: Payer: Self-pay | Admitting: Hematology

## 2018-12-01 ENCOUNTER — Other Ambulatory Visit: Payer: Self-pay

## 2018-12-01 ENCOUNTER — Telehealth: Payer: Self-pay | Admitting: *Deleted

## 2018-12-01 NOTE — Progress Notes (Signed)
Donna May denies chest pain or shortness of breath.  Patient tested negative for Covid 11/30/2018 and has been in quarantine since, except went for chemo, with a mask on. I instructed Donna May to not eat after midnight. I instructed her that she may drink clear liquids until 0430. We went obver what clear liquids ar, patient said she would probably drink apple juice and water. I instructed Donna May to not eat after midnight, and to not take pills for diabetes in am. I instructed patient to check CBG after awaking and every 2 hours until arrival  to the hospital.  I Instructed patient if CBG is less than 70 to d1/2 cup of a clear juice. Recheck CBG in 15 minutes then call pre- op desk at [phone] for further instructions.

## 2018-12-01 NOTE — Telephone Encounter (Signed)
-----   Message from Sinda Du, RN sent at 11/30/2018  4:21 PM EDT ----- Regarding: Dr. Burr Medico - 1st chemo f/u 1st Gemox.

## 2018-12-01 NOTE — Telephone Encounter (Signed)
Called pt to discuss how she did with her Chemo treatment with Oxaliplatin & Gemcitabine yesterday. She states she had a slight h/a this am & stomach churned.  She took her nausea med & felt better.  She has had BM today & knows what to take if needed.She denies any other problems & states she understands side effects & knows to call with questions/concerns.

## 2018-12-01 NOTE — Anesthesia Preprocedure Evaluation (Addendum)
Anesthesia Evaluation  Patient identified by MRN, date of birth, ID band Patient awake    Reviewed: Allergy & Precautions, NPO status , Patient's Chart, lab work & pertinent test results, reviewed documented beta blocker date and time   History of Anesthesia Complications Negative for: history of anesthetic complications  Airway Mallampati: IV  TM Distance: >3 FB Neck ROM: Full    Dental  (+) Dental Advisory Given   Pulmonary former smoker,    Pulmonary exam normal        Cardiovascular hypertension, Pt. on medications and Pt. on home beta blockers + CAD  Normal cardiovascular exam   '18 Myoperfusion - The left ventricular ejection fraction is normal (55-65%). Nuclear stress EF: 57%. There was no ST segment deviation noted during stress. No T wave inversion was noted during stress. The study is normal. This is a low risk study.    Neuro/Psych PSYCHIATRIC DISORDERS Anxiety Depression negative neurological ROS     GI/Hepatic hiatal hernia, GERD  Medicated and Controlled, Cholangiocarcinoma    Endo/Other  diabetes, Type 2, Oral Hypoglycemic Agents  Renal/GU CRFRenal disease     Musculoskeletal  (+) Arthritis ,   Abdominal   Peds  Hematology  (+) anemia ,   Anesthesia Other Findings   Reproductive/Obstetrics                            Anesthesia Physical Anesthesia Plan  ASA: III  Anesthesia Plan: General   Post-op Pain Management:    Induction: Intravenous  PONV Risk Score and Plan: 3 and Treatment may vary due to age or medical condition and Ondansetron  Airway Management Planned: LMA  Additional Equipment: None  Intra-op Plan:   Post-operative Plan: Extubation in OR  Informed Consent: I have reviewed the patients History and Physical, chart, labs and discussed the procedure including the risks, benefits and alternatives for the proposed anesthesia with the  patient or authorized representative who has indicated his/her understanding and acceptance.     Dental advisory given  Plan Discussed with: CRNA and Anesthesiologist  Anesthesia Plan Comments:        Anesthesia Quick Evaluation

## 2018-12-01 NOTE — Telephone Encounter (Signed)
Scheduled appt per 9/16 los.  Spoke with patient and she is aware of the appt date and time.

## 2018-12-02 ENCOUNTER — Encounter (HOSPITAL_COMMUNITY)
Admission: RE | Disposition: A | Discharge status: Home / Self Care | Payer: Self-pay | Source: Home / Self Care | Attending: General Surgery

## 2018-12-02 ENCOUNTER — Ambulatory Visit (HOSPITAL_COMMUNITY)
Admission: RE | Admit: 2018-12-02 | Discharge: 2018-12-02 | Disposition: A | Discharge status: Home / Self Care | Payer: Medicare Other | Attending: General Surgery | Admitting: General Surgery

## 2018-12-02 ENCOUNTER — Ambulatory Visit (HOSPITAL_COMMUNITY): Payer: Medicare Other | Admitting: Anesthesiology

## 2018-12-02 ENCOUNTER — Ambulatory Visit (HOSPITAL_COMMUNITY): Payer: Medicare Other

## 2018-12-02 ENCOUNTER — Encounter (HOSPITAL_COMMUNITY): Payer: Self-pay

## 2018-12-02 ENCOUNTER — Other Ambulatory Visit: Payer: Self-pay

## 2018-12-02 DIAGNOSIS — I10 Essential (primary) hypertension: Secondary | ICD-10-CM | POA: Insufficient documentation

## 2018-12-02 DIAGNOSIS — Z95828 Presence of other vascular implants and grafts: Secondary | ICD-10-CM

## 2018-12-02 DIAGNOSIS — K219 Gastro-esophageal reflux disease without esophagitis: Secondary | ICD-10-CM | POA: Insufficient documentation

## 2018-12-02 DIAGNOSIS — C221 Intrahepatic bile duct carcinoma: Secondary | ICD-10-CM | POA: Diagnosis not present

## 2018-12-02 DIAGNOSIS — Z79899 Other long term (current) drug therapy: Secondary | ICD-10-CM | POA: Insufficient documentation

## 2018-12-02 DIAGNOSIS — Z79810 Long term (current) use of selective estrogen receptor modulators (SERMs): Secondary | ICD-10-CM | POA: Insufficient documentation

## 2018-12-02 DIAGNOSIS — K449 Diaphragmatic hernia without obstruction or gangrene: Secondary | ICD-10-CM | POA: Diagnosis not present

## 2018-12-02 DIAGNOSIS — Z853 Personal history of malignant neoplasm of breast: Secondary | ICD-10-CM | POA: Insufficient documentation

## 2018-12-02 DIAGNOSIS — Z87891 Personal history of nicotine dependence: Secondary | ICD-10-CM | POA: Insufficient documentation

## 2018-12-02 DIAGNOSIS — C229 Malignant neoplasm of liver, not specified as primary or secondary: Secondary | ICD-10-CM | POA: Insufficient documentation

## 2018-12-02 DIAGNOSIS — I2510 Atherosclerotic heart disease of native coronary artery without angina pectoris: Secondary | ICD-10-CM | POA: Diagnosis not present

## 2018-12-02 DIAGNOSIS — Z9011 Acquired absence of right breast and nipple: Secondary | ICD-10-CM | POA: Insufficient documentation

## 2018-12-02 DIAGNOSIS — D0511 Intraductal carcinoma in situ of right breast: Secondary | ICD-10-CM | POA: Diagnosis not present

## 2018-12-02 DIAGNOSIS — K7689 Other specified diseases of liver: Secondary | ICD-10-CM | POA: Diagnosis present

## 2018-12-02 DIAGNOSIS — Z791 Long term (current) use of non-steroidal anti-inflammatories (NSAID): Secondary | ICD-10-CM | POA: Insufficient documentation

## 2018-12-02 DIAGNOSIS — Z452 Encounter for adjustment and management of vascular access device: Secondary | ICD-10-CM | POA: Diagnosis not present

## 2018-12-02 DIAGNOSIS — E119 Type 2 diabetes mellitus without complications: Secondary | ICD-10-CM | POA: Diagnosis not present

## 2018-12-02 DIAGNOSIS — Z7984 Long term (current) use of oral hypoglycemic drugs: Secondary | ICD-10-CM | POA: Insufficient documentation

## 2018-12-02 DIAGNOSIS — R918 Other nonspecific abnormal finding of lung field: Secondary | ICD-10-CM | POA: Diagnosis not present

## 2018-12-02 DIAGNOSIS — C801 Malignant (primary) neoplasm, unspecified: Secondary | ICD-10-CM

## 2018-12-02 HISTORY — PX: PORTACATH PLACEMENT: SHX2246

## 2018-12-02 HISTORY — DX: Other complications of anesthesia, initial encounter: T88.59XA

## 2018-12-02 LAB — GLUCOSE, CAPILLARY
Glucose-Capillary: 99 mg/dL (ref 70–99)
Glucose-Capillary: 106 mg/dL — ABNORMAL HIGH (ref 70–99)

## 2018-12-02 SURGERY — INSERTION, TUNNELED CENTRAL VENOUS DEVICE, WITH PORT
Anesthesia: General | Site: Chest

## 2018-12-02 MED ORDER — HEPARIN SOD (PORK) LOCK FLUSH 100 UNIT/ML IV SOLN
INTRAVENOUS | Status: AC
  Filled 2018-12-02: qty 5

## 2018-12-02 MED ORDER — GABAPENTIN 100 MG PO CAPS
100.0000 mg | ORAL_CAPSULE | ORAL | Status: AC
  Administered 2018-12-02: 100 mg via ORAL
  Filled 2018-12-02: qty 1

## 2018-12-02 MED ORDER — LIDOCAINE HCL (PF) 1 % IJ SOLN
INTRAMUSCULAR | Status: AC
  Filled 2018-12-02: qty 30

## 2018-12-02 MED ORDER — LIDOCAINE 2% (20 MG/ML) 5 ML SYRINGE
INTRAMUSCULAR | Status: DC | PRN
  Administered 2018-12-02: 60 mg via INTRAVENOUS

## 2018-12-02 MED ORDER — CHLORHEXIDINE GLUCONATE CLOTH 2 % EX PADS: 6.0000 | MEDICATED_PAD | Freq: Once | CUTANEOUS | Status: DC

## 2018-12-02 MED ORDER — BUPIVACAINE-EPINEPHRINE (PF) 0.25% -1:200000 IJ SOLN
INTRAMUSCULAR | Status: AC
  Filled 2018-12-02: qty 30

## 2018-12-02 MED ORDER — ACETAMINOPHEN 500 MG PO TABS
1000.0000 mg | ORAL_TABLET | ORAL | Status: AC
  Administered 2018-12-02: 1000 mg via ORAL
  Filled 2018-12-02: qty 2

## 2018-12-02 MED ORDER — STERILE WATER FOR IRRIGATION IR SOLN
Status: DC | PRN
  Administered 2018-12-02: 1000 mL

## 2018-12-02 MED ORDER — HEPARIN SOD (PORK) LOCK FLUSH 100 UNIT/ML IV SOLN
INTRAVENOUS | Status: DC | PRN
  Administered 2018-12-02: 500 [IU] via INTRAVENOUS

## 2018-12-02 MED ORDER — SODIUM CHLORIDE 0.9 % IV SOLN
INTRAVENOUS | Status: DC | PRN
  Administered 2018-12-02: 500 mL

## 2018-12-02 MED ORDER — HYDROCODONE-ACETAMINOPHEN 5-325 MG PO TABS: 1.0000 | ORAL_TABLET | Freq: Four times a day (QID) | ORAL | 0 refills | Status: DC | PRN

## 2018-12-02 MED ORDER — SODIUM CHLORIDE 0.9 % IV SOLN
INTRAVENOUS | Status: DC | PRN
  Administered 2018-12-02: 25 ug/min via INTRAVENOUS

## 2018-12-02 MED ORDER — PHENYLEPHRINE 40 MCG/ML (10ML) SYRINGE FOR IV PUSH (FOR BLOOD PRESSURE SUPPORT)
PREFILLED_SYRINGE | INTRAVENOUS | Status: DC | PRN
  Administered 2018-12-02: 40 ug via INTRAVENOUS
  Administered 2018-12-02: 80 ug via INTRAVENOUS
  Administered 2018-12-02: 120 ug via INTRAVENOUS
  Administered 2018-12-02 (×2): 80 ug via INTRAVENOUS

## 2018-12-02 MED ORDER — LIDOCAINE HCL 1 % IJ SOLN
INTRAMUSCULAR | Status: DC | PRN
  Administered 2018-12-02: 21 mL via INTRAMUSCULAR

## 2018-12-02 MED ORDER — PROPOFOL 10 MG/ML IV BOLUS
INTRAVENOUS | Status: DC | PRN
  Administered 2018-12-02: 130 mg via INTRAVENOUS

## 2018-12-02 MED ORDER — ONDANSETRON HCL 4 MG/2ML IJ SOLN
INTRAMUSCULAR | Status: DC | PRN
  Administered 2018-12-02: 4 mg via INTRAVENOUS

## 2018-12-02 MED ORDER — LACTATED RINGERS IV SOLN
INTRAVENOUS | Status: DC | PRN
  Administered 2018-12-02: 07:00:00 via INTRAVENOUS

## 2018-12-02 MED ORDER — FENTANYL CITRATE (PF) 250 MCG/5ML IJ SOLN
INTRAMUSCULAR | Status: AC
  Filled 2018-12-02: qty 5

## 2018-12-02 MED ORDER — SODIUM CHLORIDE 0.9 % IV SOLN
INTRAVENOUS | Status: AC
  Filled 2018-12-02: qty 1.2

## 2018-12-02 MED ORDER — PROPOFOL 10 MG/ML IV BOLUS
INTRAVENOUS | Status: AC
  Filled 2018-12-02: qty 40

## 2018-12-02 MED ORDER — CEFAZOLIN SODIUM-DEXTROSE 2-4 GM/100ML-% IV SOLN
2.0000 g | INTRAVENOUS | Status: DC
  Filled 2018-12-02: qty 100

## 2018-12-02 MED ORDER — FENTANYL CITRATE (PF) 250 MCG/5ML IJ SOLN
INTRAMUSCULAR | Status: DC | PRN
  Administered 2018-12-02: 50 ug via INTRAVENOUS

## 2018-12-02 SURGICAL SUPPLY — 42 items
ADH SKN CLS APL DERMABOND .7 (GAUZE/BANDAGES/DRESSINGS) ×1
APL PRP STRL LF DISP 70% ISPRP (MISCELLANEOUS) ×1
BAG DECANTER FOR FLEXI CONT (MISCELLANEOUS) ×2 IMPLANT
CANISTER SUCT 3000ML PPV (MISCELLANEOUS) IMPLANT
CHLORAPREP W/TINT 26 (MISCELLANEOUS) ×2 IMPLANT
COVER SURGICAL LIGHT HANDLE (MISCELLANEOUS) ×2 IMPLANT
COVER TRANSDUCER ULTRASND GEL (DRAPE) IMPLANT
COVER WAND RF STERILE (DRAPES) ×2 IMPLANT
DECANTER SPIKE VIAL GLASS SM (MISCELLANEOUS) ×4 IMPLANT
DERMABOND ADVANCED (GAUZE/BANDAGES/DRESSINGS) ×1
DERMABOND ADVANCED .7 DNX12 (GAUZE/BANDAGES/DRESSINGS) ×1 IMPLANT
DRAPE C-ARM 42X72 X-RAY (DRAPES) ×2 IMPLANT
DRAPE CHEST BREAST 15X10 FENES (DRAPES) ×2 IMPLANT
DRAPE WARM FLUID 44X44 (DRAPES) IMPLANT
ELECT COATED BLADE 2.86 ST (ELECTRODE) ×2 IMPLANT
ELECT REM PT RETURN 9FT ADLT (ELECTROSURGICAL) ×2
ELECTRODE REM PT RTRN 9FT ADLT (ELECTROSURGICAL) ×1 IMPLANT
GAUZE 4X4 16PLY RFD (DISPOSABLE) ×2 IMPLANT
GEL ULTRASOUND 20GR AQUASONIC (MISCELLANEOUS) IMPLANT
GLOVE BIO SURGEON STRL SZ 6 (GLOVE) ×2 IMPLANT
GLOVE INDICATOR 6.5 STRL GRN (GLOVE) ×2 IMPLANT
GOWN STRL REUS W/ TWL LRG LVL3 (GOWN DISPOSABLE) ×1 IMPLANT
GOWN STRL REUS W/TWL 2XL LVL3 (GOWN DISPOSABLE) ×2 IMPLANT
GOWN STRL REUS W/TWL LRG LVL3 (GOWN DISPOSABLE) ×1
KIT BASIN OR (CUSTOM PROCEDURE TRAY) ×2 IMPLANT
KIT PORT POWER 8FR ISP CVUE (Port) ×2 IMPLANT
KIT TURNOVER KIT B (KITS) ×2 IMPLANT
NEEDLE 22X1 1/2 (OR ONLY) (NEEDLE) ×2 IMPLANT
NS IRRIG 1000ML POUR BTL (IV SOLUTION) ×2 IMPLANT
PAD ARMBOARD 7.5X6 YLW CONV (MISCELLANEOUS) ×2 IMPLANT
PENCIL BUTTON HOLSTER BLD 10FT (ELECTRODE) ×2 IMPLANT
POSITIONER HEAD DONUT 9IN (MISCELLANEOUS) ×2 IMPLANT
SUT MON AB 4-0 PC3 18 (SUTURE) ×2 IMPLANT
SUT PROLENE 2 0 SH DA (SUTURE) ×4 IMPLANT
SUT VIC AB 3-0 SH 27 (SUTURE) ×1
SUT VIC AB 3-0 SH 27X BRD (SUTURE) ×1 IMPLANT
SYR 5ML LUER SLIP (SYRINGE) ×2 IMPLANT
TOWEL GREEN STERILE (TOWEL DISPOSABLE) ×2 IMPLANT
TOWEL GREEN STERILE FF (TOWEL DISPOSABLE) ×2 IMPLANT
TRAY LAPAROSCOPIC MC (CUSTOM PROCEDURE TRAY) ×2 IMPLANT
TUBE CONNECTING 12X1/4 (SUCTIONS) IMPLANT
YANKAUER SUCT BULB TIP NO VENT (SUCTIONS) IMPLANT

## 2018-12-02 NOTE — Anesthesia Postprocedure Evaluation (Signed)
Anesthesia Post Note  Patient: Donna May  Procedure(s) Performed: INSERTION PORT-A-CATH (N/A Chest)     Patient location during evaluation: PACU Anesthesia Type: General Level of consciousness: awake and alert Pain management: pain level controlled Vital Signs Assessment: post-procedure vital signs reviewed and stable Respiratory status: spontaneous breathing, nonlabored ventilation and respiratory function stable Cardiovascular status: blood pressure returned to baseline and stable Postop Assessment: no apparent nausea or vomiting Anesthetic complications: no    Last Vitals:  Vitals:   12/02/18 0915 12/02/18 0945  BP: (!) 135/57 (!) 117/53  Pulse: (!) 47 (!) 43  Resp: 16 15  Temp:  (!) 36.2 C  SpO2: 100% 100%    Last Pain:  Vitals:   12/02/18 0945  TempSrc:   PainSc: 0-No pain                 Audry Pili

## 2018-12-02 NOTE — Interval H&P Note (Signed)
History and Physical Interval Note:  12/02/2018 7:35 AM  Donna May  has presented today for surgery, with the diagnosis of CHOLANGIOCARCINOMA.  The various methods of treatment have been discussed with the patient and family. After consideration of risks, benefits and other options for treatment, the patient has consented to  Procedure(s): INSERTION PORT-A-CATH WITH POSSIBLE ULTRASOUND (N/A) as a surgical intervention.  The patient's history has been reviewed, patient examined, no change in status, stable for surgery.  I have reviewed the patient's chart and labs.  Questions were answered to the patient's satisfaction.     [name]

## 2018-12-02 NOTE — Discharge Instructions (Addendum)
[facility] Office Phone Number [phone]   POST OP INSTRUCTIONS  Always review your discharge instruction sheet given to you by the facility where your surgery was performed.  IF YOU HAVE DISABILITY OR FAMILY LEAVE FORMS, YOU MUST BRING THEM TO THE OFFICE FOR PROCESSING.  DO NOT GIVE THEM TO YOUR DOCTOR.  1. A prescription for pain medication may be given to you upon discharge.  Take your pain medication as prescribed, if needed.  If narcotic pain medicine is not needed, then you may take acetaminophen (Tylenol) or ibuprofen (Advil) as needed. 2. Take your usually prescribed medications unless otherwise directed 3. If you need a refill on your pain medication, please contact your pharmacy.  They will contact our office to request authorization.  Prescriptions will not be filled after 5pm or on week-ends. 4. You should eat very light the first 24 hours after surgery, such as soup, crackers, pudding, etc.  Resume your normal diet the day after surgery 5. It is common to experience some constipation if taking pain medication after surgery.  Increasing fluid intake and taking a stool softener will usually help or prevent this problem from occurring.  A mild laxative (Milk of Magnesia or Miralax) should be taken according to package directions if there are no bowel movements after 48 hours. 6. You may shower in 48 hours.  The surgical glue will flake off in 2-3 weeks.   7. ACTIVITIES:  No strenuous activity or heavy lifting for 1 week.   a. You may drive when you no longer are taking prescription pain medication, you can comfortably wear a seatbelt, and you can safely maneuver your car and apply brakes. b. RETURN TO WORK:  __________n/a_______________ You should see your doctor in the office for a follow-up appointment approximately three-four weeks after your surgery.    WHEN TO CALL YOUR DOCTOR: 1. Fever over 101.0 2. Nausea and/or vomiting. 3. Extreme swelling or  bruising. 4. Continued bleeding from incision. 5. Increased pain, redness, or drainage from the incision.  The clinic staff is available to answer your questions during regular business hours.  Please don't hesitate to call and ask to speak to one of the nurses for clinical concerns.  If you have a medical emergency, go to the nearest emergency room or call 911.  A surgeon from [facility] is always on call at the hospital.  For further questions, please visit [email or web address]      

## 2018-12-02 NOTE — Op Note (Signed)
PREOPERATIVE DIAGNOSIS:  cholangiocarcinoma     POSTOPERATIVE DIAGNOSIS:  Same     PROCEDURE: left subclavian port placement, Bard ClearVue  Power Port, MRI safe, 8-French.      SURGEON:  [name], MD      ANESTHESIA:  General   FINDINGS:  Good venous return, easy flush, and tip of the catheter and   SVC 22 cm.      SPECIMEN:  None.      ESTIMATED BLOOD LOSS:  Minimal.      COMPLICATIONS:  None known.      PROCEDURE:  Pt was identified in the holding area and taken to   the operating room, where patient was placed supine on the operating room   table.  General anesthesia was induced.  Patient's arms were tucked and the upper   chest and neck were prepped and draped in sterile fashion.  Time-out was   performed according to the surgical safety check list.  When all was   correct, we continued.   Local anesthetic was administered over this   area at the angle of the clavicle.  The vein was accessed with 1 pass(es) of the needle. There was good venous return and the wire passed easily with no ectopy.   Fluoroscopy was used to confirm that the wire was in the vena cava.      The patient was placed back level and the area for the pocket was anethetized   with local anesthetic.  A 3-cm transverse incision was made with a #15   blade.  Cautery was used to divide the subcutaneous tissues down to the   pectoralis muscle.  An [employer] retractor was used to elevate the skin   while a pocket was created on top of the pectoralis fascia.  The port   was placed into the pocket to confirm that it was of adequate size.  The   catheter was preattached to the port.  The port was then secured to the   pectoralis fascia with four 2-0 Prolene sutures.  These were clamped and   not tied down yet.    The catheter was tunneled through to the wire exit   site.  The catheter was placed along the wire to determine what length it should be to be in the SVC.  The catheter was cut at 22 cm.  The  tunneler sheath and dilator were passed over the wire and the dilator and wire were removed.  The catheter was advanced through the tunneler sheath and the tunneler sheath was pulled away.  Care was taken to keep the catheter in the tunneler sheath as this occurred. This was advanced and the tunneler sheath was removed.  There was good venous   return and easy flush of the catheter.  The Prolene sutures were tied   down to the pectoral fascia.  The skin was reapproximated using 3-0   Vicryl interrupted deep dermal sutures.    Fluoroscopy was used to re-confirm good position of the catheter.  The skin   was then closed using 4-0 Monocryl in a subcuticular fashion.  The port was flushed with concentrated heparin flush as well.  The wounds were then cleaned, dried, and dressed with Dermabond.  The patient was awakened from anesthesia and taken to the PACU in stable condition.  Needle, sponge, and instrument counts were correct.               [name], MD

## 2018-12-02 NOTE — Anesthesia Procedure Notes (Signed)
Procedure Name: LMA Insertion Date/Time: 12/02/2018 7:47 AM Performed by: Wilburn Cornelia, CRNA Pre-anesthesia Checklist: Patient identified, Emergency Drugs available, Suction available, Patient being monitored and Timeout performed Patient Re-evaluated:Patient Re-evaluated prior to induction Oxygen Delivery Method: Circle system utilized Preoxygenation: Pre-oxygenation with 100% oxygen Induction Type: IV induction Ventilation: Mask ventilation without difficulty LMA: LMA inserted LMA Size: 4.0 Number of attempts: 1 Placement Confirmation: positive ETCO2,  breath sounds checked- equal and bilateral and CO2 detector Tube secured with: Tape Dental Injury: Teeth and Oropharynx as per pre-operative assessment

## 2018-12-02 NOTE — Transfer of Care (Signed)
Immediate Anesthesia Transfer of Care Note  Patient: Donna May  Procedure(s) Performed: INSERTION PORT-A-CATH (N/A Chest)  Patient Location: PACU  Anesthesia Type:General  Level of Consciousness: awake and alert   Airway & Oxygen Therapy: Patient Spontanous Breathing and Patient connected to nasal cannula oxygen  Post-op Assessment: Report given to RN and Post -op Vital signs reviewed and stable  Post vital signs: Reviewed and stable  Last Vitals:  Vitals Value Taken Time  BP 143/60 12/02/18 0845  Temp    Pulse    Resp 12 12/02/18 0845  SpO2    Vitals shown include unvalidated device data.  Last Pain:  Vitals:   12/02/18 0607  TempSrc:   PainSc: 7       Patients Stated Pain Goal: 3 (37/94/44 6190)  Complications: No apparent anesthesia complications

## 2018-12-04 ENCOUNTER — Other Ambulatory Visit: Payer: Self-pay | Admitting: Obstetrics & Gynecology

## 2018-12-04 ENCOUNTER — Other Ambulatory Visit: Payer: Self-pay | Admitting: Cardiovascular Disease

## 2018-12-04 DIAGNOSIS — C221 Intrahepatic bile duct carcinoma: Secondary | ICD-10-CM | POA: Diagnosis not present

## 2018-12-04 NOTE — Progress Notes (Signed)
Donna May   Telephone:(336) [phone] Fax:(336) [phone]   Clinic Follow up Note   Patient Care Team: [name], MD as PCP - General (Family Medicine) [name], [name] Dad, MD as Consulting Physician (Oncology) [name], [name], MD as Consulting Physician (Cardiology) [name], MD as Consulting Physician (Obstetrics and Gynecology) [name], [name], NP as Nurse Practitioner (Hematology and Oncology) [facility] [name], MD as Consulting Physician (Hematology) [name], [name], MD as Consulting Physician (Gastroenterology) [name], RN as Oncology Nurse Navigator [name], MD as Consulting Physician (General Surgery) 12/05/2018  CHIEF COMPLAINT: f/u cholangiocarcinoma   SUMMARY OF ONCOLOGIC HISTORY: Oncology History  Malignant neoplasm of female breast ([place])  11/06/2018 Genetic Testing   Negative genetic testing on the [facility] Common Hereditary Cancers panel. A variant of uncertain significance was identified in one of her APC genes, called c.1243G>A (p.Ala415Thr).  The Common Hereditary Cancers Panel offered by [facility] includes sequencing and/or deletion duplication testing of the following 48 genes: APC, ATM, AXIN2, BARD1, BMPR1A, BRCA1, BRCA2, BRIP1, CDH1, CDK4, CDKN2A (p14ARF), CDKN2A (p16INK4a), CHEK2, CTNNA1, DICER1, EPCAM (Deletion/duplication testing only), GREM1 (promoter region deletion/duplication testing only), KIT, MEN1, MLH1, MSH2, MSH3, MSH6, MUTYH, NBN, NF1, NHTL1, PALB2, PDGFRA, PMS2, POLD1, POLE, PTEN, RAD50, RAD51C, RAD51D, RNF43, SDHB, SDHC, SDHD, SMAD4, SMARCA4. STK11, TP53, TSC1, TSC2, and VHL.  The following genes were evaluated for sequence changes only: SDHA and HOXB13 c.251G>A variant only.    11/30/2018 -  Chemotherapy   First line chemo oxaliplatin and gemcitabine q2weeks starting 11/30/18.    Intrahepatic cholangiocarcinoma ([place])  09/07/2018 Imaging   CT Chest IMPRESSION: 1. New, enhancing  mass involving segment 4 of the liver and fundus of gallbladder is concerning for malignancy. This may represent either metastatic disease from breast cancer or neoplasm primary to the liver or hepatic biliary tree. Further evaluation with contrast enhanced CT of the abdomen and pelvis is recommended. 2. No findings to suggest metastatic disease within the chest. 3.  Aortic Atherosclerosis (ICD10-I70.0). 4. Coronary artery calcifications.   09/13/2018 Pathology Results   Diagnosis Liver, needle/core biopsy - ADENOCARCINOMA. Microscopic Comment Immunohistochemistry for CK7 is positive. CK20, TTF1, CDX-2, GATA-3, PAX 8, Qualitative ER, p63 and CK5/6 are negative. The provided clinical history of remote mammary carcinoma is noted. Based on the morphology and immunophenotype of the adenocarcinoma observed in this specimen, primary cholangiocarcinoma is favored. Clinical and radiologic correlation are  encouraged. Results reported to [employer] on 09/15/2018. Intradepartmental consultation (Dr. Vic Ripper).   09/13/2018 Initial Diagnosis   Cholangiocarcinoma ([place])   09/23/2018 Procedure   Colonoscopy by Dr. Havery Moros 09/23/18  IMPRESSION - Two 3 to 4 mm polyps in the ascending colon, removed with a cold snare. Resected and retrieved. - Five 3 to 5 mm polyps in the transverse colon, removed with a cold snare. Resected and retrieved. - One 5 mm polyp at the splenic flexure, removed with a cold snare. Resected and retrieved. - Three 3 to 5 mm polyps in the sigmoid colon, removed with a cold snare. Resected and retrieved. - The examination was otherwise normal. Upper Endopscy by Dr. Havery Moros 09/23/18  IMPRESSION - Esophagogastric landmarks identified. - 2 cm hiatal hernia. - Normal esophagus otherwise. - A single gastric polyp. Resected and retrieved. - Mild gastritis. Biopsied. - Normal duodenal bulb and second portion of the duodenum.   09/23/2018 Pathology Results   Diagnosis 09/23/18 1.  Surgical [P], duodenum - BENIGN SMALL BOWEL MUCOSA. - NO ACTIVE INFLAMMATION OR VILLOUS ATROPHY IDENTIFIED. 2. Surgical [  P], stomach, polyp - HYPERPLASTIC POLYP(S). - THERE IS NO EVIDENCE OF MALIGNANCY. 3. Surgical [P], gastric antrum and gastric body - CHRONIC INACTIVE GASTRITIS. - THERE IS NO EVIDENCE OF HELICOBACTER-PYLORI, DYSPLASIA, OR MALIGNANCY. - SEE COMMENT. 4. Surgical [P], colon, sigmoid, splenic flexure, transverse and ascending, polyp (9) - TUBULAR ADENOMA(S). - SESSILE SERRATED POLYP WITHOUT CYTOLOGIC DYSPLASIA. - HIGH GRADE DYSPLASIA IS NOT IDENTIFIED. 5. Surgical [P], colon, sigmoid, polyp (2) - HYPERPLASTIC POLYP(S). - THERE IS NO EVIDENCE OF MALIGNANCY.   09/23/2018 Cancer Staging   Staging form: Intrahepatic Bile Duct, AJCC 8th Edition - Clinical stage from 09/23/2018: Stage IB (cT1b, cN0, cM0) - Signed by [name], MD on 10/06/2018   09/29/2018 PET scan   PET 09/29/18 IMPRESSION: 1. Hypermetabolic mass in the RIGHT hepatic lobe consistent with biopsy proven adenocarcinoma. No additional liver metastasis. 2. No evidence of local breast cancer recurrence in the RIGHT breast or RIGHT axilla. 3. Mild bilateral hypermetabolic adrenal glands is favored benign hyperplasia. 4. No evidence of additional metastatic disease on skull base to thigh FDG PET scan.   11/06/2018 Genetic Testing   Negative genetic testing on the [facility] Common Hereditary Cancers panel. A variant of uncertain significance was identified in one of her APC genes, called c.1243G>A (p.Ala415Thr).  The Common Hereditary Cancers Panel offered by [facility] includes sequencing and/or deletion duplication testing of the following 48 genes: APC, ATM, AXIN2, BARD1, BMPR1A, BRCA1, BRCA2, BRIP1, CDH1, CDK4, CDKN2A (p14ARF), CDKN2A (p16INK4a), CHEK2, CTNNA1, DICER1, EPCAM (Deletion/duplication testing only), GREM1 (promoter region deletion/duplication testing only), KIT, MEN1, MLH1, MSH2, MSH3, MSH6, MUTYH, NBN,  NF1, NHTL1, PALB2, PDGFRA, PMS2, POLD1, POLE, PTEN, RAD50, RAD51C, RAD51D, RNF43, SDHB, SDHC, SDHD, SMAD4, SMARCA4. STK11, TP53, TSC1, TSC2, and VHL.  The following genes were evaluated for sequence changes only: SDHA and HOXB13 c.251G>A variant only.    11/17/2018 Pathology Results   Diagnosis 11/17/18 1. Soft tissue, biopsy, Diaphragmatic nodules - METASTATIC ADENOCARCINOMA, CONSISTENT WITH PATIENT'S CLINICAL HISTORY OF CHOLANGIOCARCINOMA. SEE NOTE 2. Liver, biopsy, Left - LIVER PARENCHYMA WITH A BENIGN FIBROTIC NODULE - NO EVIDENCE OF MALIGNANCY 3. Stomach, biopsy - BENIGN PAPILLARY MESOTHELIAL HYPERPLASIA - NO EVIDENCE OF MALIGNANCY   11/29/2018 Imaging   CT CAP WO Contrast  IMPRESSION: 1. Dominant liver mass appears grossly stable from 09/29/2018. Additional liver lesions are too small to characterize but were not shown to be hypermetabolic on PET. 2. Mild nodularity of both adrenal glands with associated hypermetabolism on 09/29/2018. Continued attention on follow-up exams is warranted. 3. Small right lower lobe nodules, stable from 09/07/2018. Again, attention on follow-up is recommended. 4. Trace bilateral pleural fluid. 5. Aortic atherosclerosis (ICD10-170.0). Coronary artery calcification. 6. Enlarged pulmonic trunk, indicative of pulmonary arterial hypertension.     11/30/2018 -  Chemotherapy   First line chemo oxaliplatin and gemcitabine q2weeks starting 11/30/18.      CURRENT THERAPY: First line chemooxaliplatin and gemcitabine q2weeks starting 11/30/18.   INTERVAL HISTORY: Ms. Hisle returns for f/u as scheduled. She completed cycle 1 chemo on 11/30/18. She had PAC placement per Dr. Barry Dienes on 9/18. She is doing well today. She denies fatigue, more like boredom with nothing to do. She began drinking glucerna after chemo and has had 2 days of loose to runny stools, 2-3 times. She wants it to run its course. Able to eat and drink adequately. Denies n/v. No mouth sores. Cold  sensitivity was not bad, has intermittent mild tingling to fingertips which she had before chemo. No trouble with functions or ADLs. Denies abdominal  pain. PAC is slightly sore. Her left forearm felt "heavy" from treatment through IV, no redness, tenderness, bruising. Denies recent fever, chills, cough, chest pain, dyspnea.    MEDICAL HISTORY:  Past Medical History:  Diagnosis Date  . Anemia   . Anxiety   . Arthritis   . Breast cancer ([place]) 1992  . Cataract    Bilateral eyes - surgery to remove  . Chronic kidney disease    CKD stage 3  . Complication of anesthesia    "something they use make me itch for a couple of days."  . Depression   . Diabetes mellitus    type 2  . Duodenitis 01/18/2002  . Fainting spell   . Family history of breast cancer   . Family history of prostate cancer   . GERD (gastroesophageal reflux disease)   . Heart murmur    never has caused any problems  . Hiatal hernia 08/08/2008, 01/18/2002  . History of pneumonia    x 2  . Hyperlipidemia   . Hypertension   . Liver cancer ([place]) 08/2018  . Lymphedema 2017   Right arm  . Pneumonia    x 2  . UTI (lower urinary tract infection)     SURGICAL HISTORY: Past Surgical History:  Procedure Laterality Date  . AXILLARY SURGERY     cyst removal, right  . COLONOSCOPY  09/23/2018   Dr. Havery Moros - polyps  . EYE SURGERY Bilateral    cataracts to remove  . LAPAROSCOPY N/A 11/17/2018   Procedure: LAPAROSCOPY DIAGNOSTIC, INTRAOPERATIVE ULTRASOUND, PERITONEAL BIOPSIES;  Surgeon: [name], MD;  Location: [place];  Service: General;  Laterality: N/A;  GENERAL AND EPIDURAL  . LIVER BIOPSY  08/2018   Dr. Lindwood Coke  . MASTECTOMY  1992   right, with flap  . NM MYOCAR PERF WALL MOTION  06/11/2009   Protocol:[place], post stress EF58%, EKG negative for ischemia, low risk  . RECONSTRUCTION BREAST W/ TRAM FLAP Right   . TONSILLECTOMY    . TRANSTHORACIC ECHOCARDIOGRAM  12/24/2009   LVEF =>55%, normal study  . UPPER GI  ENDOSCOPY      I have reviewed the social history and family history with the patient and they are unchanged from previous note.  ALLERGIES:  has No Known Allergies.  MEDICATIONS:  Current Outpatient Medications  Medication Sig Dispense Refill  . amLODipine (NORVASC) 10 MG tablet TAKE 1 TABLET BY MOUTH EVERY DAY (Patient taking differently: Take 10 mg by mouth daily. ) 90 tablet 2  . aspirin 81 MG tablet Take 81 mg by mouth at bedtime.     Marland Kitchen CALCIUM-MAGNESIUM-VITAMIN D PO Take 1 tablet by mouth once a week.     . Cholecalciferol (VITAMIN D3) 2000 UNITS TABS Take 1 tablet by mouth once a week.     . Echinacea 400 MG CAPS Take 400 mg by mouth daily.     Marland Kitchen glucose blood (ACCU-CHEK AVIVA) test strip Use as instructed 100 each 4  . hydrochlorothiazide (HYDRODIURIL) 25 MG tablet Take 25 mg by mouth daily.    Marland Kitchen HYDROcodone-acetaminophen (NORCO/VICODIN) 5-325 MG tablet Take 1 tablet by mouth every 6 (six) hours as needed for moderate pain. 10 tablet 0  . ketoconazole (NIZORAL) 2 % cream Apply 1 fingertip amount to each foot daily. (Patient taking differently: Apply 1 application topically daily as needed (affected area of foot). Apply 1 fingertip amount to each foot daily.) 30 g 0  . lansoprazole (PREVACID) 15 MG capsule TAKE 1 CAPSULE DAILY BEFOREBREAKFAST (  NEED TO MAKE AN OFFICE VISIT FOR FURTHER   REFILLS) (Patient taking differently: Take 15 mg by mouth daily. ) 90 capsule 1  . lidocaine-prilocaine (EMLA) cream Apply 1 application topically as needed. 30 g 0  . lidocaine-prilocaine (EMLA) cream Apply to affected area once 30 g 3  . loratadine (CLARITIN) 10 MG tablet Take 10 mg by mouth daily as needed for allergies.    . metFORMIN (GLUCOPHAGE-XR) 500 MG 24 hr tablet Take 1 tablet (500 mg total) by mouth 2 (two) times daily before a meal. 180 tablet 1  . metoprolol succinate (TOPROL-XL) 100 MG 24 hr tablet TAKE 1 TABLET (100 MG TOTAL) BY MOUTH DAILY. TAKE WITH OR IMMEDIATELY FOLLOWING A MEAL. 90  tablet 1  . Multiple Vitamins-Minerals (CENTRUM SILVER ULTRA WOMENS) TABS Take 1 tablet by mouth once a week.     . Omega 3 1000 MG CAPS Take 2,000 mg by mouth daily.     . ondansetron (ZOFRAN) 8 MG tablet Take 1 tablet (8 mg total) by mouth every 8 (eight) hours as needed for nausea or vomiting. 30 tablet 0  . prochlorperazine (COMPAZINE) 10 MG tablet Take 1 tablet (10 mg total) by mouth every 6 (six) hours as needed for nausea or vomiting. 30 tablet 0  . raloxifene (EVISTA) 60 MG tablet TAKE 1 TABLET DAILY (Patient taking differently: Take 60 mg by mouth daily. ) 90 tablet 0  . rosuvastatin (CRESTOR) 20 MG tablet Take 1 tablet (20 mg total) by mouth daily. (Patient taking differently: Take 20 mg by mouth at bedtime. ) 90 tablet 0  . sitaGLIPtin (JANUVIA) 100 MG tablet Take 1 tablet (100 mg total) by mouth daily. 90 tablet 4  . triamcinolone cream (KENALOG) 0.1 % Apply 1 application topically 2 (two) times daily. (Patient taking differently: Apply 1 application topically 2 (two) times daily as needed (rash). ) 30 g 0  . valsartan (DIOVAN) 320 MG tablet Take 1 tablet (320 mg total) by mouth daily. 90 tablet 1  . HYDROcodone-acetaminophen (NORCO/VICODIN) 5-325 MG tablet Take 1 tablet by mouth every 6 (six) hours as needed for moderate pain. 10 tablet 0   Current Facility-Administered Medications  Medication Dose Route Frequency Provider Last Rate Last Dose  . influenza vaccine adjuvanted (FLUAD) injection 0.5 mL  0.5 mL Intramuscular Once Cira Rue K, NP      . triamcinolone acetonide (KENALOG) 10 MG/ML injection 10 mg  10 mg Other Once Harriet Masson, DPM        PHYSICAL EXAMINATION: ECOG PERFORMANCE STATUS: 1 - Symptomatic but completely ambulatory  Vitals:   12/05/18 0848  BP: (!) 154/70  Pulse: 65  Resp: 17  Temp: 98.3 F (36.8 C)  SpO2: 100%   Filed Weights   12/05/18 0848  Weight: 146 lb 6.4 oz (66.4 kg)    GENERAL:alert, no distress and comfortable SKIN: no rash  EYES:   sclera clear LUNGS: clear with normal breathing effort HEART: regular rate & rhythm, no lower extremity edema ABDOMEN: abdomen soft, non-tender and normal bowel sounds. Surgical incisions scabbed, healing well without erythema or drainage  Musculoskeletal:no cyanosis of digits. Right arm lymphedema. No significant swelling, tenderness, erythema, or bruising to left arm NEURO: alert & oriented x 3 with fluent speech PAC site with dermabond, incisions closed without erythema or drainage. Mild ecchymosis to left side   LABORATORY DATA:  I have reviewed the data as listed CBC Latest Ref Rng & Units 12/05/2018 11/30/2018 11/09/2018  WBC 4.0 - 10.5 K/uL  3.0(L) 6.6 5.6  Hemoglobin 12.0 - 15.0 g/dL 10.9(L) 10.8(L) 11.6(L)  Hematocrit 36.0 - 46.0 % 34.2(L) 34.7(L) 37.1  Platelets 150 - 400 K/uL 204 309 221     CMP Latest Ref Rng & Units 12/05/2018 11/30/2018 11/09/2018  Glucose 70 - 99 mg/dL 88 139(H) 106(H)  BUN 8 - 23 mg/dL '17 19 19  '$ Creatinine 0.44 - 1.00 mg/dL 1.09(H) 1.34(H) 1.40(H)  Sodium 135 - 145 mmol/L 139 140 142  Potassium 3.5 - 5.1 mmol/L 4.1 3.9 3.8  Chloride 98 - 111 mmol/L 104 104 107  CO2 22 - 32 mmol/L '26 28 26  '$ Calcium 8.9 - 10.3 mg/dL 9.0 8.7(L) 9.5  Total Protein 6.5 - 8.1 g/dL 6.6 6.4(L) 6.5  Total Bilirubin 0.3 - 1.2 mg/dL 0.4 0.3 0.8  Alkaline Phos 38 - 126 U/L 59 52 45  AST 15 - 41 U/L 42(H) 13(L) 19  ALT 0 - 44 U/L '9 7 15      '$ RADIOGRAPHIC STUDIES: I have personally reviewed the radiological images as listed and agreed with the findings in the report. No results found.   ASSESSMENT & PLAN: Donna May is a 78 y.o. female with   1.Intrahepatic cholangiocarcinoma, cT1N0M1,with peritoneal metastasis -She presented with mild weight loss, no significant abdominal pain. Biopsy of her liver mass shows adenocarcinoma,most consistent withcholangiocarcinoma -Her EGD/Colonoscopy from 7/10/20was negativeand 09/29/18 PET scanshowedno evidence of distant  metastasis. -She was brought to OR on 9/3,unfortunately she was found to have peritoneal metastasis to right diaphragm and surgery was aborted. -Dr. Burr Medico previously reviewed CT 11/29/18 which shows dominate liver mass stable to mild increase, mild nodularity of both adrenal glands which warrants being monitored.  No visible peritoneal metastasis on CT scan. -She began first line oxaliplatin and gemcitabine every 2 weeks on 11/30/18, the goal of therapy is palliative. oxaliplatin was dose reduced due to age and comorbidities  -Her FO results are still pending to determine if she is eligible for target or immunotherapy.   2. H/o right breast cancer, Genetics -s/p mastectomy in 1992, per patient did not require adjuvant therapy  -followed by Dr. Jana Hakim -Her genetic testing was negative for pathogenetic mutations. She did have VUS of gene APC.  3. Comorbidities: DM, HTN, hiatal hernia, GERD, renal artery stenosis -f/u with PCP -BP stable today -Cr improved, 1.09  4. Family support  -she has a son and daughter  80.Goal of care discussion  -treatment goal is palliative. -she is full code now  6. Peripheral neuropathy  -she had intermittent mild tingling to fingertips before starting chemo, likely r/t DM -will monitor closely on chemo, especially oxaliplatin. No change after cycle 1   Disposition:  Ms. Glendinning appears stable. She completed cycle 1 Gemox. She tolerated well with mild diarrhea, which she attributes to glucerna. She is able to remain hydrated orally. VSS. She No other significant toxicities. I encouraged her to try imodium, she will think about it. She can switch to boost or ensure. Labs reviewed, Cr improved to 1.09. Fasting BG 88 this morning. I encouraged her to eat when she wakes up and continue small frequent meals. CBC stable, ANC 1.7; we reviewed infection precautions. She does not require IVF support today. She will return for f/u and cycle 2 on 9/30. She will  receive high dose flu vaccine today.  She had left forearm "heaviness" after treatment via peripheral IV in that area. No erythema or significant swelling. Difficult to compare to right arm due to preexisting LE from  breast surgery. Due to recent PAC insertion, we reviewed the risk of thrombosis in the extremity. She will monitor for increasing tenderness, swelling, or erythema.   All questions were answered. The patient knows to call the clinic with any problems, questions or concerns. No barriers to learning was detected.     Alla Feeling, NP 12/05/18

## 2018-12-05 ENCOUNTER — Inpatient Hospital Stay: Payer: Medicare Other

## 2018-12-05 ENCOUNTER — Inpatient Hospital Stay (HOSPITAL_BASED_OUTPATIENT_CLINIC_OR_DEPARTMENT_OTHER): Payer: Medicare Other | Admitting: Nurse Practitioner

## 2018-12-05 ENCOUNTER — Encounter: Payer: Self-pay | Admitting: Nurse Practitioner

## 2018-12-05 ENCOUNTER — Other Ambulatory Visit: Payer: Self-pay

## 2018-12-05 VITALS — BP 154/70 | HR 65 | Temp 98.3°F | Resp 17 | Ht 65.0 in | Wt 146.4 lb

## 2018-12-05 DIAGNOSIS — C221 Intrahepatic bile duct carcinoma: Secondary | ICD-10-CM | POA: Diagnosis not present

## 2018-12-05 DIAGNOSIS — I129 Hypertensive chronic kidney disease with stage 1 through stage 4 chronic kidney disease, or unspecified chronic kidney disease: Secondary | ICD-10-CM | POA: Diagnosis not present

## 2018-12-05 DIAGNOSIS — C786 Secondary malignant neoplasm of retroperitoneum and peritoneum: Secondary | ICD-10-CM | POA: Diagnosis not present

## 2018-12-05 DIAGNOSIS — Z23 Encounter for immunization: Secondary | ICD-10-CM | POA: Diagnosis not present

## 2018-12-05 DIAGNOSIS — Z5111 Encounter for antineoplastic chemotherapy: Secondary | ICD-10-CM | POA: Diagnosis not present

## 2018-12-05 DIAGNOSIS — N183 Chronic kidney disease, stage 3 (moderate): Secondary | ICD-10-CM | POA: Diagnosis not present

## 2018-12-05 LAB — CMP (CANCER CENTER ONLY)
ALT: 9 U/L (ref 0–44)
AST: 42 U/L — ABNORMAL HIGH (ref 15–41)
Albumin: 3.2 g/dL — ABNORMAL LOW (ref 3.5–5.0)
Alkaline Phosphatase: 59 U/L (ref 38–126)
Anion gap: 9 (ref 5–15)
BUN: 17 mg/dL (ref 8–23)
CO2: 26 mmol/L (ref 22–32)
Calcium: 9 mg/dL (ref 8.9–10.3)
Chloride: 104 mmol/L (ref 98–111)
Creatinine: 1.09 mg/dL — ABNORMAL HIGH (ref 0.44–1.00)
GFR, Est AFR Am: 56 mL/min — ABNORMAL LOW (ref >60)
GFR, Estimated: 48 mL/min — ABNORMAL LOW (ref >60)
Glucose, Bld: 88 mg/dL (ref 70–99)
Potassium: 4.1 mmol/L (ref 3.5–5.1)
Sodium: 139 mmol/L (ref 135–145)
Total Bilirubin: 0.4 mg/dL (ref 0.3–1.2)
Total Protein: 6.6 g/dL (ref 6.5–8.1)

## 2018-12-05 LAB — CBC WITH DIFFERENTIAL (CANCER CENTER ONLY)
Abs Immature Granulocytes: 0.01 10*3/uL (ref 0.00–0.07)
Basophils Absolute: 0 10*3/uL (ref 0.0–0.1)
Basophils Relative: 0 %
Eosinophils Absolute: 0.1 10*3/uL (ref 0.0–0.5)
Eosinophils Relative: 2 %
HCT: 34.2 % — ABNORMAL LOW (ref 36.0–46.0)
Hemoglobin: 10.9 g/dL — ABNORMAL LOW (ref 12.0–15.0)
Immature Granulocytes: 0 %
Lymphocytes Relative: 40 %
Lymphs Abs: 1.2 10*3/uL (ref 0.7–4.0)
MCH: 28.9 pg (ref 26.0–34.0)
MCHC: 31.9 g/dL (ref 30.0–36.0)
MCV: 90.7 fL (ref 80.0–100.0)
Monocytes Absolute: 0 10*3/uL — ABNORMAL LOW (ref 0.1–1.0)
Monocytes Relative: 1 %
Neutro Abs: 1.7 10*3/uL (ref 1.7–7.7)
Neutrophils Relative %: 57 %
Platelet Count: 204 10*3/uL (ref 150–400)
RBC: 3.77 MIL/uL — ABNORMAL LOW (ref 3.87–5.11)
RDW: 15.2 % (ref 11.5–15.5)
WBC Count: 3 10*3/uL — ABNORMAL LOW (ref 4.0–10.5)
nRBC: 0 % (ref 0.0–0.2)

## 2018-12-05 MED ORDER — INFLUENZA VAC A&B SA ADJ QUAD 0.5 ML IM PRSY
PREFILLED_SYRINGE | INTRAMUSCULAR | Status: AC
  Filled 2018-12-05: qty 0.5

## 2018-12-05 MED ORDER — INFLUENZA VAC A&B SA ADJ QUAD 0.5 ML IM PRSY
0.5000 mL | PREFILLED_SYRINGE | Freq: Once | INTRAMUSCULAR | Status: AC
  Administered 2018-12-05: 0.5 mL via INTRAMUSCULAR

## 2018-12-06 ENCOUNTER — Telehealth: Payer: Self-pay | Admitting: Nurse Practitioner

## 2018-12-06 ENCOUNTER — Telehealth: Payer: Self-pay

## 2018-12-06 ENCOUNTER — Encounter (HOSPITAL_COMMUNITY): Payer: Self-pay | Admitting: Hematology

## 2018-12-06 NOTE — Telephone Encounter (Signed)
Patient called to ask about OTC medications discussed at apt with [facility] NP. Reviewed use of Imodium and need to keep chart of loose stools. Encouraged patient to call and speak with Dr. Ernestina Penna nurse if Imodium does not help.

## 2018-12-06 NOTE — Telephone Encounter (Signed)
Scheduled appt per 9/21 los.

## 2018-12-09 NOTE — Progress Notes (Signed)
Donna May   [phone] [phone]   Clinic Follow up Note   Patient Care Team: [name], MD as PCP - General (Family Medicine) [name], [name] Dad, MD as Consulting Physician (Oncology) [name], [name], MD as Consulting Physician (Cardiology) [name], MD as Consulting Physician (Obstetrics and Gynecology) [name], [name], NP as Nurse Practitioner (Hematology and Oncology) [facility] [name], MD as Consulting Physician (Hematology) [name], [name], MD as Consulting Physician (Gastroenterology) [name], RN as Oncology Nurse Navigator [name], MD as Consulting Physician (General Surgery)  Date of Service:  12/14/2018  CHIEF COMPLAINT: F/u ofcholangiocarcinomaof liver  SUMMARY OF ONCOLOGIC HISTORY: Oncology History  Malignant neoplasm of female breast ([place])  11/06/2018 Genetic Testing   Negative genetic testing on the [facility] Common Hereditary Cancers panel. A variant of uncertain significance was identified in one of her APC genes, called c.1243G>A (p.Ala415Thr).  The Common Hereditary Cancers Panel offered by [facility] includes sequencing and/or deletion duplication testing of the following 48 genes: APC, ATM, AXIN2, BARD1, BMPR1A, BRCA1, BRCA2, BRIP1, CDH1, CDK4, CDKN2A (p14ARF), CDKN2A (p16INK4a), CHEK2, CTNNA1, DICER1, EPCAM (Deletion/duplication testing only), GREM1 (promoter region deletion/duplication testing only), KIT, MEN1, MLH1, MSH2, MSH3, MSH6, MUTYH, NBN, NF1, NHTL1, PALB2, PDGFRA, PMS2, POLD1, POLE, PTEN, RAD50, RAD51C, RAD51D, RNF43, SDHB, SDHC, SDHD, SMAD4, SMARCA4. STK11, TP53, TSC1, TSC2, and VHL.  The following genes were evaluated for sequence changes only: SDHA and HOXB13 c.251G>A variant only.    11/30/2018 -  Chemotherapy   First line chemo oxaliplatin and gemcitabine q2weeks starting 11/30/18.    Intrahepatic cholangiocarcinoma ([place])  09/07/2018 Imaging   CT Chest  IMPRESSION: 1. New, enhancing mass involving segment 4 of the liver and fundus of gallbladder is concerning for malignancy. This may represent either metastatic disease from breast cancer or neoplasm primary to the liver or hepatic biliary tree. Further evaluation with contrast enhanced CT of the abdomen and pelvis is recommended. 2. No findings to suggest metastatic disease within the chest. 3.  Aortic Atherosclerosis (ICD10-I70.0). 4. Coronary artery calcifications.   09/13/2018 Pathology Results   Diagnosis Liver, needle/core biopsy - ADENOCARCINOMA. Microscopic Comment Immunohistochemistry for CK7 is positive. CK20, TTF1, CDX-2, GATA-3, PAX 8, Qualitative ER, p63 and CK5/6 are negative. The provided clinical history of remote mammary carcinoma is noted. Based on the morphology and immunophenotype of the adenocarcinoma observed in this specimen, primary cholangiocarcinoma is favored. Clinical and radiologic correlation are  encouraged. Results reported to [employer] on 09/15/2018. Intradepartmental consultation (Dr. Vic Ripper).   09/13/2018 Initial Diagnosis   Cholangiocarcinoma ([place])   09/23/2018 Procedure   Colonoscopy by Dr. Havery Moros 09/23/18  IMPRESSION - Two 3 to 4 mm polyps in the ascending colon, removed with a cold snare. Resected and retrieved. - Five 3 to 5 mm polyps in the transverse colon, removed with a cold snare. Resected and retrieved. - One 5 mm polyp at the splenic flexure, removed with a cold snare. Resected and retrieved. - Three 3 to 5 mm polyps in the sigmoid colon, removed with a cold snare. Resected and retrieved. - The examination was otherwise normal. Upper Endopscy by Dr. Havery Moros 09/23/18  IMPRESSION - Esophagogastric landmarks identified. - 2 cm hiatal hernia. - Normal esophagus otherwise. - A single gastric polyp. Resected and retrieved. - Mild gastritis. Biopsied. - Normal duodenal bulb and second portion of the duodenum.   09/23/2018 Pathology  Results   Diagnosis 09/23/18 1. Surgical [P], duodenum - BENIGN SMALL BOWEL MUCOSA. - NO ACTIVE INFLAMMATION OR  VILLOUS ATROPHY IDENTIFIED. 2. Surgical [P], stomach, polyp - HYPERPLASTIC POLYP(S). - THERE IS NO EVIDENCE OF MALIGNANCY. 3. Surgical [P], gastric antrum and gastric body - CHRONIC INACTIVE GASTRITIS. - THERE IS NO EVIDENCE OF HELICOBACTER-PYLORI, DYSPLASIA, OR MALIGNANCY. - SEE COMMENT. 4. Surgical [P], colon, sigmoid, splenic flexure, transverse and ascending, polyp (9) - TUBULAR ADENOMA(S). - SESSILE SERRATED POLYP WITHOUT CYTOLOGIC DYSPLASIA. - HIGH GRADE DYSPLASIA IS NOT IDENTIFIED. 5. Surgical [P], colon, sigmoid, polyp (2) - HYPERPLASTIC POLYP(S). - THERE IS NO EVIDENCE OF MALIGNANCY.   09/23/2018 Cancer Staging   Staging form: Intrahepatic Bile Duct, AJCC 8th Edition - Clinical stage from 09/23/2018: Stage IB (cT1b, cN0, cM0) - Signed by [name], MD on 10/06/2018   09/29/2018 PET scan   PET 09/29/18 IMPRESSION: 1. Hypermetabolic mass in the RIGHT hepatic lobe consistent with biopsy proven adenocarcinoma. No additional liver metastasis. 2. No evidence of local breast cancer recurrence in the RIGHT breast or RIGHT axilla. 3. Mild bilateral hypermetabolic adrenal glands is favored benign hyperplasia. 4. No evidence of additional metastatic disease on skull base to thigh FDG PET scan.   11/06/2018 Genetic Testing   Negative genetic testing on the [facility] Common Hereditary Cancers panel. A variant of uncertain significance was identified in one of her APC genes, called c.1243G>A (p.Ala415Thr).  The Common Hereditary Cancers Panel offered by [facility] includes sequencing and/or deletion duplication testing of the following 48 genes: APC, ATM, AXIN2, BARD1, BMPR1A, BRCA1, BRCA2, BRIP1, CDH1, CDK4, CDKN2A (p14ARF), CDKN2A (p16INK4a), CHEK2, CTNNA1, DICER1, EPCAM (Deletion/duplication testing only), GREM1 (promoter region deletion/duplication testing only), KIT, MEN1, MLH1,  MSH2, MSH3, MSH6, MUTYH, NBN, NF1, NHTL1, PALB2, PDGFRA, PMS2, POLD1, POLE, PTEN, RAD50, RAD51C, RAD51D, RNF43, SDHB, SDHC, SDHD, SMAD4, SMARCA4. STK11, TP53, TSC1, TSC2, and VHL.  The following genes were evaluated for sequence changes only: SDHA and HOXB13 c.251G>A variant only.    11/17/2018 Pathology Results   Diagnosis 11/17/18 1. Soft tissue, biopsy, Diaphragmatic nodules - METASTATIC ADENOCARCINOMA, CONSISTENT WITH PATIENT'S CLINICAL HISTORY OF CHOLANGIOCARCINOMA. SEE NOTE 2. Liver, biopsy, Left - LIVER PARENCHYMA WITH A BENIGN FIBROTIC NODULE - NO EVIDENCE OF MALIGNANCY 3. Stomach, biopsy - BENIGN PAPILLARY MESOTHELIAL HYPERPLASIA - NO EVIDENCE OF MALIGNANCY   11/29/2018 Imaging   CT CAP WO Contrast  IMPRESSION: 1. Dominant liver mass appears grossly stable from 09/29/2018. Additional liver lesions are too small to characterize but were not shown to be hypermetabolic on PET. 2. Mild nodularity of both adrenal glands with associated hypermetabolism on 09/29/2018. Continued attention on follow-up exams is warranted. 3. Small right lower lobe nodules, stable from 09/07/2018. Again, attention on follow-up is recommended. 4. Trace bilateral pleural fluid. 5. Aortic atherosclerosis (ICD10-170.0). Coronary artery calcification. 6. Enlarged pulmonic trunk, indicative of pulmonary arterial hypertension.     11/30/2018 -  Chemotherapy   First line chemo oxaliplatin and gemcitabine q2weeks starting 11/30/18.       CURRENT THERAPY:  First line chemooxaliplatin and gemcitabine q2weeks starting 11/30/18.   INTERVAL HISTORY:  Donna May is here for a follow up and treatment. She presents to the clinic alone. She notes her first cycle went well. She only took 1 compazine given mild churning of stomach. She notes she did not have cold sensitivity after 3 days after infusion. She notes she was able to be active and did not feel bad. She notes is able to do house chores. She notes only  having 1 episode of diarrhea. She only took 2 imodium to stop it. She notes her BP run 160s.  She notes her PAC was used this morning.     REVIEW OF SYSTEMS:   Constitutional: Denies fevers, chills or abnormal weight loss Eyes: Denies blurriness of vision Ears, nose, mouth, throat, and face: Denies mucositis or sore throat Respiratory: Denies cough, dyspnea or wheezes Cardiovascular: Denies palpitation, chest discomfort or lower extremity swelling Gastrointestinal:  Denies nausea, heartburn or change in bowel habits Skin: Denies abnormal skin rashes Lymphatics: Denies new lymphadenopathy or easy bruising Neurological:Denies numbness, tingling or new weaknesses Behavioral/Psych: Mood is stable, no new changes  All other systems were reviewed with the patient and are negative.  MEDICAL HISTORY:  Past Medical History:  Diagnosis Date  . Anemia   . Anxiety   . Arthritis   . Breast cancer ([place]) 1992  . Cataract    Bilateral eyes - surgery to remove  . Chronic kidney disease    CKD stage 3  . Complication of anesthesia    "something they use make me itch for a couple of days."  . Depression   . Diabetes mellitus    type 2  . Duodenitis 01/18/2002  . Fainting spell   . Family history of breast cancer   . Family history of prostate cancer   . GERD (gastroesophageal reflux disease)   . Heart murmur    never has caused any problems  . Hiatal hernia 08/08/2008, 01/18/2002  . History of pneumonia    x 2  . Hyperlipidemia   . Hypertension   . Liver cancer ([place]) 08/2018  . Lymphedema 2017   Right arm  . Pneumonia    x 2  . UTI (lower urinary tract infection)     SURGICAL HISTORY: Past Surgical History:  Procedure Laterality Date  . AXILLARY SURGERY     cyst removal, right  . COLONOSCOPY  09/23/2018   Dr. Havery Moros - polyps  . EYE SURGERY Bilateral    cataracts to remove  . LAPAROSCOPY N/A 11/17/2018   Procedure: LAPAROSCOPY DIAGNOSTIC, INTRAOPERATIVE ULTRASOUND, PERITONEAL  BIOPSIES;  Surgeon: [name], MD;  Location: [facility];  Service: General;  Laterality: N/A;  GENERAL AND EPIDURAL  . LIVER BIOPSY  08/2018   Dr. Lindwood Coke  . MASTECTOMY  1992   right, with flap  . NM MYOCAR PERF WALL MOTION  06/11/2009   Protocol:[place], post stress EF58%, EKG negative for ischemia, low risk  . PORTACATH PLACEMENT N/A 12/02/2018   Procedure: INSERTION PORT-A-CATH;  Surgeon: [name], MD;  Location: [place];  Service: General;  Laterality: N/A;  . RECONSTRUCTION BREAST W/ TRAM FLAP Right   . TONSILLECTOMY    . TRANSTHORACIC ECHOCARDIOGRAM  12/24/2009   LVEF =>55%, normal study  . UPPER GI ENDOSCOPY      I have reviewed the social history and family history with the patient and they are unchanged from previous note.  ALLERGIES:  has No Known Allergies.  MEDICATIONS:  Current Outpatient Medications  Medication Sig Dispense Refill  . amLODipine (NORVASC) 10 MG tablet TAKE 1 TABLET BY MOUTH EVERY DAY (Patient taking differently: Take 10 mg by mouth daily. ) 90 tablet 2  . aspirin 81 MG tablet Take 81 mg by mouth at bedtime.     Marland Kitchen CALCIUM-MAGNESIUM-VITAMIN D PO Take 1 tablet by mouth once a week.     . Cholecalciferol (VITAMIN D3) 2000 UNITS TABS Take 1 tablet by mouth once a week.     . Echinacea 400 MG CAPS Take 400 mg by mouth daily.     Marland Kitchen glucose blood (  ACCU-CHEK AVIVA) test strip Use as instructed 100 each 4  . hydrochlorothiazide (HYDRODIURIL) 25 MG tablet TAKE 1 TABLET DAILY (TO    REPLACE COMBINATION) 90 tablet 0  . HYDROcodone-acetaminophen (NORCO/VICODIN) 5-325 MG tablet Take 1 tablet by mouth every 6 (six) hours as needed for moderate pain. 10 tablet 0  . HYDROcodone-acetaminophen (NORCO/VICODIN) 5-325 MG tablet Take 1 tablet by mouth every 6 (six) hours as needed for moderate pain. 10 tablet 0  . ketoconazole (NIZORAL) 2 % cream Apply 1 fingertip amount to each foot daily. (Patient taking differently: Apply 1 application topically daily as needed (affected area  of foot). Apply 1 fingertip amount to each foot daily.) 30 g 0  . lansoprazole (PREVACID) 15 MG capsule TAKE 1 CAPSULE DAILY BEFOREBREAKFAST (NEED TO MAKE AN OFFICE VISIT FOR FURTHER   REFILLS) (Patient taking differently: Take 15 mg by mouth daily. ) 90 capsule 1  . lidocaine-prilocaine (EMLA) cream Apply 1 application topically as needed. 30 g 0  . lidocaine-prilocaine (EMLA) cream Apply to affected area once 30 g 3  . loratadine (CLARITIN) 10 MG tablet Take 10 mg by mouth daily as needed for allergies.    . metFORMIN (GLUCOPHAGE-XR) 500 MG 24 hr tablet Take 1 tablet (500 mg total) by mouth 2 (two) times daily before a meal. 180 tablet 1  . metoprolol succinate (TOPROL-XL) 100 MG 24 hr tablet TAKE 1 TABLET (100 MG TOTAL) BY MOUTH DAILY. TAKE WITH OR IMMEDIATELY FOLLOWING A MEAL. 90 tablet 1  . Multiple Vitamins-Minerals (CENTRUM SILVER ULTRA WOMENS) TABS Take 1 tablet by mouth daily.     . Omega 3 1000 MG CAPS Take 2,000 mg by mouth daily.     . ondansetron (ZOFRAN) 8 MG tablet Take 1 tablet (8 mg total) by mouth every 8 (eight) hours as needed for nausea or vomiting. 30 tablet 0  . prochlorperazine (COMPAZINE) 10 MG tablet Take 1 tablet (10 mg total) by mouth every 6 (six) hours as needed for nausea or vomiting. 30 tablet 0  . raloxifene (EVISTA) 60 MG tablet TAKE 1 TABLET DAILY 90 tablet 0  . rosuvastatin (CRESTOR) 20 MG tablet Take 1 tablet (20 mg total) by mouth daily. (Patient taking differently: Take 20 mg by mouth at bedtime. ) 90 tablet 0  . sitaGLIPtin (JANUVIA) 100 MG tablet Take 1 tablet (100 mg total) by mouth daily. 90 tablet 4  . triamcinolone cream (KENALOG) 0.1 % Apply 1 application topically 2 (two) times daily. (Patient taking differently: Apply 1 application topically 2 (two) times daily as needed (rash). ) 30 g 0  . valsartan (DIOVAN) 320 MG tablet TAKE 1 TABLET DAILY (TO    REPLACE COMBINATION) 90 tablet 0   Current Facility-Administered Medications  Medication Dose Route  Frequency Provider Last Rate Last Dose  . triamcinolone acetonide (KENALOG) 10 MG/ML injection 10 mg  10 mg Other Once Harriet Masson, DPM        PHYSICAL EXAMINATION: ECOG PERFORMANCE STATUS: 1 - Symptomatic but completely ambulatory  Vitals:   12/14/18 1215 12/14/18 1216  BP: (!) 169/77 (!) 175/74  Pulse: 76   Resp: 17   Temp: 98.5 F (36.9 C)   SpO2: 97%    Filed Weights   12/14/18 1215  Weight: 151 lb 1.6 oz (68.5 kg)    GENERAL:alert, no distress and comfortable SKIN: skin color, texture, turgor are normal, no rashes or significant lesions (+) PAC clean EYES: normal, Conjunctiva are pink and non-injected, sclera clear  NECK: supple, thyroid  normal size, non-tender, without nodularity LYMPH:  no palpable lymphadenopathy in the cervical, axillary  LUNGS: clear to auscultation and percussion with normal breathing effort HEART: regular rate & rhythm and no murmurs and no lower extremity edema ABDOMEN:abdomen soft, non-tender and normal bowel sounds Musculoskeletal:no cyanosis of digits and no clubbing  NEURO: alert & oriented x 3 with fluent speech, no focal motor/sensory deficits  LABORATORY DATA:  I have reviewed the data as listed CBC Latest Ref Rng & Units 12/14/2018 12/05/2018 11/30/2018  WBC 4.0 - 10.5 K/uL 4.7 3.0(L) 6.6  Hemoglobin 12.0 - 15.0 g/dL 10.7(L) 10.9(L) 10.8(L)  Hematocrit 36.0 - 46.0 % 34.3(L) 34.2(L) 34.7(L)  Platelets 150 - 400 K/uL 279 204 309     CMP Latest Ref Rng & Units 12/14/2018 12/05/2018 11/30/2018  Glucose 70 - 99 mg/dL 105(H) 88 139(H)  BUN 8 - 23 mg/dL '18 17 19  '$ Creatinine 0.44 - 1.00 mg/dL 1.28(H) 1.09(H) 1.34(H)  Sodium 135 - 145 mmol/L 143 139 140  Potassium 3.5 - 5.1 mmol/L 4.2 4.1 3.9  Chloride 98 - 111 mmol/L 106 104 104  CO2 22 - 32 mmol/L '25 26 28  '$ Calcium 8.9 - 10.3 mg/dL 9.4 9.0 8.7(L)  Total Protein 6.5 - 8.1 g/dL 6.9 6.6 6.4(L)  Total Bilirubin 0.3 - 1.2 mg/dL 0.2(L) 0.4 0.3  Alkaline Phos 38 - 126 U/L 63 59 52  AST 15 -  41 U/L 26 42(H) 13(L)  ALT 0 - 44 U/L '19 9 7      '$ RADIOGRAPHIC STUDIES: I have personally reviewed the radiological images as listed and agreed with the findings in the report. No results found.   ASSESSMENT & PLAN:  Donna May is a 78 y.o. female with   1.Intrahepatic cholangiocarcinoma, cT1N0M1,with peritoneal metastasis, MSS, IDH1 mutation (+) -She presented with mild weight loss, no significant abdominal pain. Biopsy of her liver mass shows adenocarcinoma,most consistent withcholangiocarcinoma -Her EGD/Colonoscopy from 7/10/20was negativeand 09/29/18 PET scanshowedno evidence of distant metastasis. -She was brought to OR on 9/3,unfortunately she was found to have peritoneal metastasis to right diaphragm and surgery was aborted. -Her CT AP from 11/29/18 shows dominate liver mass stable to mild increase, mild nodularity of both adrenal glands which warrants being monitored.  No visible peritoneal metastasis on CT scan. -Given her metastatic cancer and ineligibility for surgery, I recommend systemic chemotherapy  to control her disease. Due to her CKD, she is not a candidate for cisplatin.  -I started her on first-line Oxaliplatin and Gemcitabine q2weeks on 11/30/18.  -I discussed her FO results which showed MSI stable disease, IDH mutation positive.  I would consider IDH 1 inhibitor in the future.  Not a candidate for immunotherapy -She had port placement by Dr. Barry Dienes on 12/02/18. -She tolerated first cycle chemo well with only minimal nausea and mild diarrhea. I reviewed antiemetic use and she can continue low dose imodium. I encouraged her to watch for cold sensitivity and to not drink anything cold for 5 days after infusion.  -Labs reviewed, CBC and CMP WNL except hg 10.7 Cr 1.28. Overall adequate to proceed with C2 Oxaliplatin and Gemcitabine today.    -Will repeat scan after cycle 3-4 to monitor response.  -F/u in 2 weeks -She notes she has had flu shot this year.    2.  H/o right breast cancer, Geneticsnegative  -s/p mastectomy in 1992, per patient did not require adjuvant therapy  -followed by Dr. Jana Hakim -Her genetic testing was negative for pathogenetic mutations. She did have  VUS of gene APC.  3. Comorbidities: DM, HTN, hiatal hernia, GERD, renal artery stenosis -f/u with PCP -We will monitor her blood pressure and glucose level closely during her chemo. We discussed that chemotherapy will mayimpact her blood glucose and blood pressure, and we will adjust her meds as needed -Cr at 1.28 today and BP elevated at 175/74 (12/14/18). She is on HCTZ, Amlodipine and Metoprolol. Will continue. Given she is on steroids I encouraged her to watch her BG at home daily and to reduce sugar intake the week of chemo.   4. Family support  -she has a son and daughter,they were on the phone during her visit today  5.Goal of care discussion  -We previously discussed the incurable nature of her cancer, and the overall poor prognosis, especially if she does not have good response to chemotherapy or progress on chemo -The patient understands the goal of care is palliative. -she is full code now   PLAN: -Labs reviewed and adequate to proceed with C2 Oxaliplatin and Gemcitabine at same dose -Lab, flush, f/u and cycle 3 chemo in 2 weeks    No problem-specific Assessment & Plan notes found for this encounter.   No orders of the defined types were placed in this encounter.  All questions were answered. The patient knows to call the clinic with any problems, questions or concerns. No barriers to learning was detected. I spent 20 minutes counseling the patient face to face. The total time spent in the appointment was 25 minutes and more than 50% was on counseling and review of test results     [name], MD 12/14/2018   I, Joslyn Devon, am acting as scribe for [name], MD.   I have reviewed the above documentation for accuracy and completeness, and I  agree with the above.

## 2018-12-14 ENCOUNTER — Inpatient Hospital Stay: Payer: Medicare Other

## 2018-12-14 ENCOUNTER — Other Ambulatory Visit: Payer: Self-pay

## 2018-12-14 ENCOUNTER — Other Ambulatory Visit: Payer: Medicare Other

## 2018-12-14 ENCOUNTER — Inpatient Hospital Stay: Payer: Medicare Other | Admitting: Genetic Counselor

## 2018-12-14 ENCOUNTER — Encounter: Payer: Self-pay | Admitting: Hematology

## 2018-12-14 ENCOUNTER — Inpatient Hospital Stay (HOSPITAL_BASED_OUTPATIENT_CLINIC_OR_DEPARTMENT_OTHER): Payer: Medicare Other | Admitting: Hematology

## 2018-12-14 ENCOUNTER — Ambulatory Visit: Payer: Medicare Other

## 2018-12-14 VITALS — BP 175/74 | HR 76 | Temp 98.5°F | Resp 17 | Ht 65.0 in | Wt 151.1 lb

## 2018-12-14 DIAGNOSIS — I10 Essential (primary) hypertension: Secondary | ICD-10-CM

## 2018-12-14 DIAGNOSIS — Z171 Estrogen receptor negative status [ER-]: Secondary | ICD-10-CM

## 2018-12-14 DIAGNOSIS — C221 Intrahepatic bile duct carcinoma: Secondary | ICD-10-CM

## 2018-12-14 DIAGNOSIS — C50911 Malignant neoplasm of unspecified site of right female breast: Secondary | ICD-10-CM | POA: Diagnosis not present

## 2018-12-14 DIAGNOSIS — N183 Chronic kidney disease, stage 3 (moderate): Secondary | ICD-10-CM

## 2018-12-14 DIAGNOSIS — E0822 Diabetes mellitus due to underlying condition with diabetic chronic kidney disease: Secondary | ICD-10-CM | POA: Diagnosis not present

## 2018-12-14 DIAGNOSIS — I129 Hypertensive chronic kidney disease with stage 1 through stage 4 chronic kidney disease, or unspecified chronic kidney disease: Secondary | ICD-10-CM | POA: Diagnosis not present

## 2018-12-14 DIAGNOSIS — C786 Secondary malignant neoplasm of retroperitoneum and peritoneum: Secondary | ICD-10-CM | POA: Diagnosis not present

## 2018-12-14 DIAGNOSIS — Z23 Encounter for immunization: Secondary | ICD-10-CM | POA: Diagnosis not present

## 2018-12-14 DIAGNOSIS — Z5111 Encounter for antineoplastic chemotherapy: Secondary | ICD-10-CM | POA: Diagnosis not present

## 2018-12-14 DIAGNOSIS — Z95828 Presence of other vascular implants and grafts: Secondary | ICD-10-CM

## 2018-12-14 LAB — CBC WITH DIFFERENTIAL (CANCER CENTER ONLY)
Abs Immature Granulocytes: 0.02 10*3/uL (ref 0.00–0.07)
Basophils Absolute: 0 10*3/uL (ref 0.0–0.1)
Basophils Relative: 0 %
Eosinophils Absolute: 0.1 10*3/uL (ref 0.0–0.5)
Eosinophils Relative: 3 %
HCT: 34.3 % — ABNORMAL LOW (ref 36.0–46.0)
Hemoglobin: 10.7 g/dL — ABNORMAL LOW (ref 12.0–15.0)
Immature Granulocytes: 0 %
Lymphocytes Relative: 37 %
Lymphs Abs: 1.8 10*3/uL (ref 0.7–4.0)
MCH: 28.7 pg (ref 26.0–34.0)
MCHC: 31.2 g/dL (ref 30.0–36.0)
MCV: 92 fL (ref 80.0–100.0)
Monocytes Absolute: 0.8 10*3/uL (ref 0.1–1.0)
Monocytes Relative: 17 %
Neutro Abs: 2 10*3/uL (ref 1.7–7.7)
Neutrophils Relative %: 43 %
Platelet Count: 279 10*3/uL (ref 150–400)
RBC: 3.73 MIL/uL — ABNORMAL LOW (ref 3.87–5.11)
RDW: 15.6 % — ABNORMAL HIGH (ref 11.5–15.5)
WBC Count: 4.7 10*3/uL (ref 4.0–10.5)
nRBC: 0 % (ref 0.0–0.2)

## 2018-12-14 LAB — CMP (CANCER CENTER ONLY)
ALT: 19 U/L (ref 0–44)
AST: 26 U/L (ref 15–41)
Albumin: 3.5 g/dL (ref 3.5–5.0)
Alkaline Phosphatase: 63 U/L (ref 38–126)
Anion gap: 12 (ref 5–15)
BUN: 18 mg/dL (ref 8–23)
CO2: 25 mmol/L (ref 22–32)
Calcium: 9.4 mg/dL (ref 8.9–10.3)
Chloride: 106 mmol/L (ref 98–111)
Creatinine: 1.28 mg/dL — ABNORMAL HIGH (ref 0.44–1.00)
GFR, Est AFR Am: 46 mL/min — ABNORMAL LOW (ref >60)
GFR, Estimated: 40 mL/min — ABNORMAL LOW (ref >60)
Glucose, Bld: 105 mg/dL — ABNORMAL HIGH (ref 70–99)
Potassium: 4.2 mmol/L (ref 3.5–5.1)
Sodium: 143 mmol/L (ref 135–145)
Total Bilirubin: 0.2 mg/dL — ABNORMAL LOW (ref 0.3–1.2)
Total Protein: 6.9 g/dL (ref 6.5–8.1)

## 2018-12-14 MED ORDER — SODIUM CHLORIDE 0.9% FLUSH
10.0000 mL | INTRAVENOUS | Status: DC | PRN
  Administered 2018-12-14: 10 mL
  Filled 2018-12-14: qty 10

## 2018-12-14 MED ORDER — DEXAMETHASONE SODIUM PHOSPHATE 10 MG/ML IJ SOLN
INTRAMUSCULAR | Status: AC
  Filled 2018-12-14: qty 1

## 2018-12-14 MED ORDER — OXALIPLATIN CHEMO INJECTION 100 MG/20ML
85.0000 mg/m2 | Freq: Once | INTRAVENOUS | Status: AC
  Administered 2018-12-14: 150 mg via INTRAVENOUS
  Filled 2018-12-14: qty 20

## 2018-12-14 MED ORDER — HEPARIN SOD (PORK) LOCK FLUSH 100 UNIT/ML IV SOLN
500.0000 [IU] | Freq: Once | INTRAVENOUS | Status: AC | PRN
  Administered 2018-12-14: 500 [IU]
  Filled 2018-12-14: qty 5

## 2018-12-14 MED ORDER — SODIUM CHLORIDE 0.9 % IV SOLN
1000.0000 mg/m2 | Freq: Once | INTRAVENOUS | Status: AC
  Administered 2018-12-14: 14:00:00 1748 mg via INTRAVENOUS
  Filled 2018-12-14: qty 45.97

## 2018-12-14 MED ORDER — SODIUM CHLORIDE 0.9% FLUSH
10.0000 mL | Freq: Once | INTRAVENOUS | Status: AC
  Administered 2018-12-14: 10 mL
  Filled 2018-12-14: qty 10

## 2018-12-14 MED ORDER — DEXAMETHASONE SODIUM PHOSPHATE 10 MG/ML IJ SOLN
10.0000 mg | Freq: Once | INTRAMUSCULAR | Status: AC
  Administered 2018-12-14: 10 mg via INTRAVENOUS

## 2018-12-14 MED ORDER — PALONOSETRON HCL INJECTION 0.25 MG/5ML
INTRAVENOUS | Status: AC
  Filled 2018-12-14: qty 5

## 2018-12-14 MED ORDER — PALONOSETRON HCL INJECTION 0.25 MG/5ML
0.2500 mg | Freq: Once | INTRAVENOUS | Status: AC
  Administered 2018-12-14: 0.25 mg via INTRAVENOUS

## 2018-12-14 MED ORDER — SODIUM CHLORIDE 0.9 % IV SOLN
Freq: Once | INTRAVENOUS | Status: AC
  Administered 2018-12-14: 13:00:00 via INTRAVENOUS
  Filled 2018-12-14: qty 250

## 2018-12-14 MED ORDER — DEXTROSE 5 % IV SOLN
Freq: Once | INTRAVENOUS | Status: AC
  Administered 2018-12-14: 14:00:00 via INTRAVENOUS
  Filled 2018-12-14: qty 250

## 2018-12-14 NOTE — Progress Notes (Signed)
Met with patient in treatment area to introduce myself as Financial Resource Specialist and to offer available resources. ° °Discussed one-time $700 [facility] grant and qualifications to assist with personal expenses while going through treatment. ° °Gave her my card if interested in applying and for any additional financial questions or concerns.  °

## 2018-12-14 NOTE — Patient Instructions (Signed)
[facility] Discharge Instructions for Patients Receiving Chemotherapy  Today you received the following chemotherapy agents Gemzar and Oxaliplatin   To help prevent nausea and vomiting after your treatment, we encourage you to take your nausea medication as directed. No Zofran for 3 days, take Compazine instead.   If you develop nausea and vomiting that is not controlled by your nausea medication, call the clinic.   BELOW ARE SYMPTOMS THAT SHOULD BE REPORTED IMMEDIATELY:  *FEVER GREATER THAN 100.5 F  *CHILLS WITH OR WITHOUT FEVER  NAUSEA AND VOMITING THAT IS NOT CONTROLLED WITH YOUR NAUSEA MEDICATION  *UNUSUAL SHORTNESS OF BREATH  *UNUSUAL BRUISING OR BLEEDING  TENDERNESS IN MOUTH AND THROAT WITH OR WITHOUT PRESENCE OF ULCERS  *URINARY PROBLEMS  *BOWEL PROBLEMS  UNUSUAL RASH Items with * indicate a potential emergency and should be followed up as soon as possible.  Feel free to call the clinic should you have any questions or concerns. The clinic phone number is [phone].  Please show the [facility] at check-in to the Emergency Department and triage nurse.  Gemcitabine injection What is this medicine? GEMCITABINE (jem SYE ta been) is a chemotherapy drug. This medicine is used to treat many types of cancer like breast cancer, lung cancer, pancreatic cancer, and ovarian cancer. This medicine may be used for other purposes; ask your health care provider or pharmacist if you have questions. COMMON BRAND NAME(S): Gemzar, Infugem What should I tell my health care provider before I take this medicine? They need to know if you have any of these conditions:  blood disorders  infection  kidney disease  liver disease  lung or breathing disease, like asthma  recent or ongoing radiation therapy  an unusual or allergic reaction to gemcitabine, other chemotherapy, other medicines, foods, dyes, or preservatives  pregnant or trying to get  pregnant  breast-feeding How should I use this medicine? This drug is given as an infusion into a vein. It is administered in a hospital or clinic by a specially trained health care professional. Talk to your pediatrician regarding the use of this medicine in children. Special care may be needed. Overdosage: If you think you have taken too much of this medicine contact a poison control center or emergency room at once. NOTE: This medicine is only for you. Do not share this medicine with others. What if I miss a dose? It is important not to miss your dose. Call your doctor or health care professional if you are unable to keep an appointment. What may interact with this medicine?  medicines to increase blood counts like filgrastim, pegfilgrastim, sargramostim  some other chemotherapy drugs like cisplatin  vaccines Talk to your doctor or health care professional before taking any of these medicines:  acetaminophen  aspirin  ibuprofen  ketoprofen  naproxen This list may not describe all possible interactions. Give your health care provider a list of all the medicines, herbs, non-prescription drugs, or dietary supplements you use. Also tell them if you smoke, drink alcohol, or use illegal drugs. Some items may interact with your medicine. What should I watch for while using this medicine? Visit your doctor for checks on your progress. This drug may make you feel generally unwell. This is not uncommon, as chemotherapy can affect healthy cells as well as cancer cells. Report any side effects. Continue your course of treatment even though you feel ill unless your doctor tells you to stop. In some cases, you may be given additional medicines to  help with side effects. Follow all directions for their use. Call your doctor or health care professional for advice if you get a fever, chills or sore throat, or other symptoms of a cold or flu. Do not treat yourself. This drug decreases your body's  ability to fight infections. Try to avoid being around people who are sick. This medicine may increase your risk to bruise or bleed. Call your doctor or health care professional if you notice any unusual bleeding. Be careful brushing and flossing your teeth or using a toothpick because you may get an infection or bleed more easily. If you have any dental work done, tell your dentist you are receiving this medicine. Avoid taking products that contain aspirin, acetaminophen, ibuprofen, naproxen, or ketoprofen unless instructed by your doctor. These medicines may hide a fever. Do not become pregnant while taking this medicine or for 6 months after stopping it. Women should inform their doctor if they wish to become pregnant or think they might be pregnant. Men should not father a child while taking this medicine and for 3 months after stopping it. There is a potential for serious side effects to an unborn child. Talk to your health care professional or pharmacist for more information. Do not breast-feed an infant while taking this medicine or for at least 1 week after stopping it. Men should inform their doctors if they wish to father a child. This medicine may lower sperm counts. Talk with your doctor or health care professional if you are concerned about your fertility. What side effects may I notice from receiving this medicine? Side effects that you should report to your doctor or health care professional as soon as possible:  allergic reactions like skin rash, itching or hives, swelling of the face, lips, or tongue  breathing problems  pain, redness, or irritation at site where injected  signs and symptoms of a dangerous change in heartbeat or heart rhythm like chest pain; dizziness; fast or irregular heartbeat; palpitations; feeling faint or lightheaded, falls; breathing problems  signs of decreased platelets or bleeding - bruising, pinpoint red spots on the skin, black, tarry stools, blood in  the urine  signs of decreased red blood cells - unusually weak or tired, feeling faint or lightheaded, falls  signs of infection - fever or chills, cough, sore throat, pain or difficulty passing urine  signs and symptoms of kidney injury like trouble passing urine or change in the amount of urine  signs and symptoms of liver injury like dark yellow or brown urine; general ill feeling or flu-like symptoms; light-colored stools; loss of appetite; nausea; right upper belly pain; unusually weak or tired; yellowing of the eyes or skin  swelling of ankles, feet, hands Side effects that usually do not require medical attention (report to your doctor or health care professional if they continue or are bothersome):  constipation  diarrhea  hair loss  loss of appetite  nausea  rash  vomiting This list may not describe all possible side effects. Call your doctor for medical advice about side effects. You may report side effects to FDA at 1-800-FDA-1088. Where should I keep my medicine? This drug is given in a hospital or clinic and will not be stored at home. NOTE: This sheet is a summary. It may not cover all possible information. If you have questions about this medicine, talk to your doctor, pharmacist, or health care provider.  2020 [employer] Standard (2017-05-26 18:06:11)  Oxaliplatin Injection What is this medicine? OXALIPLATIN (ox AL  i PLA tin) is a chemotherapy drug. It targets fast dividing cells, like cancer cells, and causes these cells to die. This medicine is used to treat cancers of the colon and rectum, and many other cancers. This medicine may be used for other purposes; ask your health care provider or pharmacist if you have questions. COMMON BRAND NAME(S): Eloxatin What should I tell my health care provider before I take this medicine? They need to know if you have any of these conditions:  kidney disease  an unusual or allergic reaction to oxaliplatin, other  chemotherapy, other medicines, foods, dyes, or preservatives  pregnant or trying to get pregnant  breast-feeding How should I use this medicine? This drug is given as an infusion into a vein. It is administered in a hospital or clinic by a specially trained health care professional. Talk to your pediatrician regarding the use of this medicine in children. Special care may be needed. Overdosage: If you think you have taken too much of this medicine contact a poison control center or emergency room at once. NOTE: This medicine is only for you. Do not share this medicine with others. What if I miss a dose? It is important not to miss a dose. Call your doctor or health care professional if you are unable to keep an appointment. What may interact with this medicine?  medicines to increase blood counts like filgrastim, pegfilgrastim, sargramostim  probenecid  some antibiotics like amikacin, gentamicin, neomycin, polymyxin B, streptomycin, tobramycin  zalcitabine Talk to your doctor or health care professional before taking any of these medicines:  acetaminophen  aspirin  ibuprofen  ketoprofen  naproxen This list may not describe all possible interactions. Give your health care provider a list of all the medicines, herbs, non-prescription drugs, or dietary supplements you use. Also tell them if you smoke, drink alcohol, or use illegal drugs. Some items may interact with your medicine. What should I watch for while using this medicine? Your condition will be monitored carefully while you are receiving this medicine. You will need important blood work done while you are taking this medicine. This medicine can make you more sensitive to cold. Do not drink cold drinks or use ice. Cover exposed skin before coming in contact with cold temperatures or cold objects. When out in cold weather wear warm clothing and cover your mouth and nose to warm the air that goes into your lungs. Tell your  doctor if you get sensitive to the cold. This drug may make you feel generally unwell. This is not uncommon, as chemotherapy can affect healthy cells as well as cancer cells. Report any side effects. Continue your course of treatment even though you feel ill unless your doctor tells you to stop. In some cases, you may be given additional medicines to help with side effects. Follow all directions for their use. Call your doctor or health care professional for advice if you get a fever, chills or sore throat, or other symptoms of a cold or flu. Do not treat yourself. This drug decreases your body's ability to fight infections. Try to avoid being around people who are sick. This medicine may increase your risk to bruise or bleed. Call your doctor or health care professional if you notice any unusual bleeding. Be careful brushing and flossing your teeth or using a toothpick because you may get an infection or bleed more easily. If you have any dental work done, tell your dentist you are receiving this medicine. Avoid taking products that  contain aspirin, acetaminophen, ibuprofen, naproxen, or ketoprofen unless instructed by your doctor. These medicines may hide a fever. Do not become pregnant while taking this medicine. Women should inform their doctor if they wish to become pregnant or think they might be pregnant. There is a potential for serious side effects to an unborn child. Talk to your health care professional or pharmacist for more information. Do not breast-feed an infant while taking this medicine. Call your doctor or health care professional if you get diarrhea. Do not treat yourself. What side effects may I notice from receiving this medicine? Side effects that you should report to your doctor or health care professional as soon as possible:  allergic reactions like skin rash, itching or hives, swelling of the face, lips, or tongue  low blood counts - This drug may decrease the number of white  blood cells, red blood cells and platelets. You may be at increased risk for infections and bleeding.  signs of infection - fever or chills, cough, sore throat, pain or difficulty passing urine  signs of decreased platelets or bleeding - bruising, pinpoint red spots on the skin, black, tarry stools, nosebleeds  signs of decreased red blood cells - unusually weak or tired, fainting spells, lightheadedness  breathing problems  chest pain, pressure  cough  diarrhea  jaw tightness  mouth sores  nausea and vomiting  pain, swelling, redness or irritation at the injection site  pain, tingling, numbness in the hands or feet  problems with balance, talking, walking  redness, blistering, peeling or loosening of the skin, including inside the mouth  trouble passing urine or change in the amount of urine Side effects that usually do not require medical attention (report to your doctor or health care professional if they continue or are bothersome):  changes in vision  constipation  hair loss  loss of appetite  metallic taste in the mouth or changes in taste  stomach pain This list may not describe all possible side effects. Call your doctor for medical advice about side effects. You may report side effects to FDA at 1-800-FDA-1088. Where should I keep my medicine? This drug is given in a hospital or clinic and will not be stored at home. NOTE: This sheet is a summary. It may not cover all possible information. If you have questions about this medicine, talk to your doctor, pharmacist, or health care provider.  2020 [employer]/Gold Standard (2007-09-27 17:22:47)

## 2018-12-15 ENCOUNTER — Telehealth: Payer: Self-pay | Admitting: Hematology

## 2018-12-15 ENCOUNTER — Encounter: Payer: Self-pay | Admitting: Family Medicine

## 2018-12-15 ENCOUNTER — Ambulatory Visit (INDEPENDENT_AMBULATORY_CARE_PROVIDER_SITE_OTHER): Payer: Medicare Other | Admitting: Family Medicine

## 2018-12-15 VITALS — BP 132/60 | HR 59 | Temp 98.0°F | Wt 148.0 lb

## 2018-12-15 DIAGNOSIS — C221 Intrahepatic bile duct carcinoma: Secondary | ICD-10-CM | POA: Diagnosis not present

## 2018-12-15 DIAGNOSIS — E1121 Type 2 diabetes mellitus with diabetic nephropathy: Secondary | ICD-10-CM | POA: Diagnosis not present

## 2018-12-15 DIAGNOSIS — I10 Essential (primary) hypertension: Secondary | ICD-10-CM | POA: Diagnosis not present

## 2018-12-15 DIAGNOSIS — N1832 Chronic kidney disease, stage 3b: Secondary | ICD-10-CM | POA: Diagnosis not present

## 2018-12-15 NOTE — Patient Instructions (Signed)
Managing Your Hypertension Hypertension is commonly called high blood pressure. This is when the force of your blood pressing against the walls of your arteries is too strong. Arteries are blood vessels that carry blood from your heart throughout your body. Hypertension forces the heart to work harder to pump blood, and may cause the arteries to become narrow or stiff. Having untreated or uncontrolled hypertension can cause heart attack, stroke, kidney disease, and other problems. What are blood pressure readings? A blood pressure reading consists of a higher number over a lower number. Ideally, your blood pressure should be below 120/80. The first ("top") number is called the systolic pressure. It is a measure of the pressure in your arteries as your heart beats. The second ("bottom") number is called the diastolic pressure. It is a measure of the pressure in your arteries as the heart relaxes. What does my blood pressure reading mean? Blood pressure is classified into four stages. Based on your blood pressure reading, your health care provider may use the following stages to determine what type of treatment you need, if any. Systolic pressure and diastolic pressure are measured in a unit called mm Hg. Normal  Systolic pressure: below 078.  Diastolic pressure: below 80. Elevated  Systolic pressure: 675-449.  Diastolic pressure: below 80. Hypertension stage 1  Systolic pressure: 201-007.  Diastolic pressure: 12-19. Hypertension stage 2  Systolic pressure: 758 or above.  Diastolic pressure: 90 or above. What health risks are associated with hypertension? Managing your hypertension is an important responsibility. Uncontrolled hypertension can lead to:  A heart attack.  A stroke.  A weakened blood vessel (aneurysm).  Heart failure.  Kidney damage.  Eye damage.  Metabolic syndrome.  Memory and concentration problems. What changes can I make to manage my  hypertension? Hypertension can be managed by making lifestyle changes and possibly by taking medicines. Your health care provider will help you make a plan to bring your blood pressure within a normal range. Eating and drinking   Eat a diet that is high in fiber and potassium, and low in salt (sodium), added sugar, and fat. An example eating plan is called the DASH (Dietary Approaches to Stop Hypertension) diet. To eat this way: ? Eat plenty of fresh fruits and vegetables. Try to fill half of your plate at each meal with fruits and vegetables. ? Eat whole grains, such as whole wheat pasta, brown rice, or whole grain bread. Fill about one quarter of your plate with whole grains. ? Eat low-fat diary products. ? Avoid fatty cuts of meat, processed or cured meats, and poultry with skin. Fill about one quarter of your plate with lean proteins such as fish, chicken without skin, beans, eggs, and tofu. ? Avoid premade and processed foods. These tend to be higher in sodium, added sugar, and fat.  Reduce your daily sodium intake. Most people with hypertension should eat less than 1,500 mg of sodium a day.  Limit alcohol intake to no more than 1 drink a day for nonpregnant women and 2 drinks a day for men. One drink equals 12 oz of beer, 5 oz of wine, or 1 oz of hard liquor. Lifestyle  Work with your health care provider to maintain a healthy body weight, or to lose weight. Ask what an ideal weight is for you.  Get at least 30 minutes of exercise that causes your heart to beat faster (aerobic exercise) most days of the week. Activities may include walking, swimming, or biking.  Include exercise  to strengthen your muscles (resistance exercise), such as weight lifting, as part of your weekly exercise routine. Try to do these types of exercises for 30 minutes at least 3 days a week.  Do not use any products that contain nicotine or tobacco, such as cigarettes and e-cigarettes. If you need help quitting,  ask your health care provider.  Control any long-term (chronic) conditions you have, such as high cholesterol or diabetes. Monitoring  Monitor your blood pressure at home as told by your health care provider. Your personal target blood pressure may vary depending on your medical conditions, your age, and other factors.  Have your blood pressure checked regularly, as often as told by your health care provider. Working with your health care provider  Review all the medicines you take with your health care provider because there may be side effects or interactions.  Talk with your health care provider about your diet, exercise habits, and other lifestyle factors that may be contributing to hypertension.  Visit your health care provider regularly. Your health care provider can help you create and adjust your plan for managing hypertension. Will I need medicine to control my blood pressure? Your health care provider may prescribe medicine if lifestyle changes are not enough to get your blood pressure under control, and if:  Your systolic blood pressure is 130 or higher.  Your diastolic blood pressure is 80 or higher. Take medicines only as told by your health care provider. Follow the directions carefully. Blood pressure medicines must be taken as prescribed. The medicine does not work as well when you skip doses. Skipping doses also puts you at risk for problems. Contact a health care provider if:  You think you are having a reaction to medicines you have taken.  You have repeated (recurrent) headaches.  You feel dizzy.  You have swelling in your ankles.  You have trouble with your vision. Get help right away if:  You develop a severe headache or confusion.  You have unusual weakness or numbness, or you feel faint.  You have severe pain in your chest or abdomen.  You vomit repeatedly.  You have trouble breathing. Summary  Hypertension is when the force of blood pumping  through your arteries is too strong. If this condition is not controlled, it may put you at risk for serious complications.  Your personal target blood pressure may vary depending on your medical conditions, your age, and other factors. For most people, a normal blood pressure is less than 120/80.  Hypertension is managed by lifestyle changes, medicines, or both. Lifestyle changes include weight loss, eating a healthy, low-sodium diet, exercising more, and limiting alcohol. This information is not intended to replace advice given to you by your health care provider. Make sure you discuss any questions you have with your health care provider. Document Released: 11/25/2011 Document Revised: 06/24/2018 Document Reviewed: 01/29/2016 Elsevier Patient Education  2020 [employer].  Diabetes Mellitus and Sick Day Management Blood sugar (glucose) can be difficult to control when you are sick. Common illnesses that can cause problems for people with diabetes (diabetes mellitus) include colds, fever, flu (influenza), nausea, vomiting, and diarrhea. These illnesses can cause stress and loss of body fluids (dehydration), and those issues can cause blood glucose levels to increase. Because of this, it is very important to take your insulin and diabetes medicines and eat some form of carbohydrate when you are sick. You should make a plan for days when you are sick (sick day plan) as  part of your diabetes management plan. You and your health care provider should make this plan in advance. The following guidelines are intended to help you manage an illness that lasts for about 24 hours or less. Your health care provider may also give you more specific instructions. What do I need to do to manage my blood glucose?   Check your blood glucose every 2-4 hours, or as often as told by your health care provider.  Know your sick day treatment goals. Your target blood glucose levels may be different when you are  sick.  If you use insulin, take your usual dose. ? If your blood glucose continues to be too high, you may need to take an additional insulin dose as told by your health care provider.  If you use oral diabetes medicine, you may need to stop taking it if you are not able to eat or drink normally. Ask your health care provider about whether you need to stop taking these medicines while you are sick.  If you use injectable hormone medicines other than insulin to control your diabetes, ask your health care provider about whether you need to stop taking these medicines while you are sick. What else can I do to manage my diabetes when I am sick? Check your ketones  If you have type 1 diabetes, check your urine ketones every 4 hours.  If you have type 2 diabetes, check your urine ketones as often as told by your health care provider. Drink fluids  Drink enough fluid to keep your urine clear or pale yellow. This is especially important if you have a fever, vomiting, or diarrhea. Those symptoms can lead to dehydration.  Follow any instructions from your health care provider about beverages to avoid. ? Do not drink alcohol, caffeine, or drinks that contain a lot of sugar. Take medicines as directed  Take-over-the-counter and prescription medicines only as told by your health care provider.  Check medicine labels for added sugars. Some medicines may contain sugar or types of sugars that can raise your blood glucose level. What foods can I eat when I am sick?  You need to eat some form of carbohydrates when you are sick. You should eat 45-50 grams (45-50 g) of carbohydrates every 3-4 hours until you feel better. All of the food choices below contain about 15 g of carbohydrates. Plan ahead and keep some of these foods around so you have them if you get sick.  4-6 oz (120-177 mL) carbonated beverage that contains sugar, such as regular (not diet) soda. You may be able to drink carbonated beverages  more easily if you open the beverage and let it sit at room temperature for a few minutes before drinking.   of a twin frozen ice pop.  4 oz (120 g) regular gelatin.  4 oz (120 mL) fruit juice.  4 oz (120 g) ice cream or frozen yogurt.  2 oz (60 g) sherbet.  8 oz (240 mL) clear broth or soup.  4 oz (120 g) regular custard.  4 oz (120 g) regular pudding.  8 oz (240 g) plain yogurt.  1 slice bread or toast.  6 saltine crackers.  5 vanilla wafers. Questions to ask your health care provider Consider asking the following questions so you know what to do on days when you are sick:  Should I adjust my diabetes medicines?  How often do I need to check my blood glucose?  What supplies do I need to manage  my diabetes at home when I am sick?  What number can I call if I have questions?  What foods and drinks should I avoid? Contact a health care provider if:  You develop symptoms of diabetic ketoacidosis, such as: ? Fatigue. ? Weight loss. ? Excessive thirst. ? Light-headedness. ? Fruity or sweet-smelling breath. ? Excessive urination. ? Vision changes. ? Confusion or irritability. ? Nausea. ? Vomiting. ? Rapid breathing. ? Pain in the abdomen. ? Feeling flushed.  You are unable to drink fluids without vomiting.  You have any of the following for more than 6 hours: ? Nausea. ? Vomiting. ? Diarrhea.  Your blood glucose is at or above 240 mg/dL (13.3 mmol/L), even after you take an additional insulin dose.  You have a change in how you think, feel, or act (mental status).  You develop another serious illness.  You have been sick or have had a fever for 2 days or longer and you are not getting better. Get help right away if:  Your blood glucose is lower than 54 mg/dL (3.0 mmol/L).  You have difficulty breathing.  You have moderate or high ketone levels in your urine.  You used emergency glucagon to treat low blood glucose. Summary  Blood sugar  (glucose) can be difficult to control when you are sick. Common illnesses that can cause problems for people with diabetes (diabetes mellitus) include colds, fever, flu (influenza), nausea, vomiting, and diarrhea.  Illnesses can cause stress and loss of body fluids (dehydration), and those issues can cause blood glucose levels to increase.  Make a plan for days when you are sick (sick day plan) as part of your diabetes management plan. You and your health care provider should make this plan in advance.  It is very important to take your insulin and diabetes medicines and to eat some form of carbohydrate when you are sick.  Contact your health care provider if have problems managing your blood glucose levels when you are sick, or if you have been sick or had a fever for 2 days or longer and are not getting better. This information is not intended to replace advice given to you by your health care provider. Make sure you discuss any questions you have with your health care provider. Document Released: 03/05/2003 Document Revised: 11/29/2015 Document Reviewed: 11/29/2015 Elsevier Patient Education  2020 [place] With Depression Everyone experiences occasional disappointment, sadness, and loss in their lives. When you are feeling down, blue, or sad for at least 2 weeks in a row, it may mean that you have depression. Depression can affect your thoughts and feelings, relationships, daily activities, and physical health. It is caused by changes in the way your brain functions. If you receive a diagnosis of depression, your health care provider will tell you which type of depression you have and what treatment options are available to you. If you are living with depression, there are ways to help you recover from it and also ways to prevent it from coming back. How to cope with lifestyle changes Coping with stress     Stress is your bodys reaction to life changes and events, both good and  bad. Stressful situations may include:  Getting married.  The death of a spouse.  Losing a job.  Retiring.  Having a baby. Stress can last just a few hours or it can be ongoing. Stress can play a major role in depression, so it is important to learn both how to cope  with stress and how to think about it differently. Talk with your health care provider or a counselor if you would like to learn more about stress reduction. He or she may suggest some stress reduction techniques, such as:  Music therapy. This can include creating music or listening to music. Choose music that you enjoy and that inspires you.  Mindfulness-based meditation. This kind of meditation can be done while sitting or walking. It involves being aware of your normal breaths, rather than trying to control your breathing.  Centering prayer. This is a kind of meditation that involves focusing on a spiritual word or phrase. Choose a word, phrase, or sacred image that is meaningful to you and that brings you peace.  Deep breathing. To do this, expand your stomach and inhale slowly through your nose. Hold your breath for 3-5 seconds, then exhale slowly, allowing your stomach muscles to relax.  Muscle relaxation. This involves intentionally tensing muscles then relaxing them. Choose a stress reduction technique that fits your lifestyle and personality. Stress reduction techniques take time and practice to develop. Set aside 5-15 minutes a day to do them. Therapists can offer training in these techniques. The training may be covered by some insurance plans. Other things you can do to manage stress include:  Keeping a stress diary. This can help you learn what triggers your stress and ways to control your response.  Understanding what your limits are and saying no to requests or events that lead to a schedule that is too full.  Thinking about how you respond to certain situations. You may not be able to control everything, but  you can control how you react.  Adding humor to your life by watching funny films or TV shows.  Making time for activities that help you relax and not feeling guilty about spending your time this way.  Medicines Your health care provider may suggest certain medicines if he or she feels that they will help improve your condition. Avoid using alcohol and other substances that may prevent your medicines from working properly (may interact). It is also important to:  Talk with your pharmacist or health care provider about all the medicines that you take, their possible side effects, and what medicines are safe to take together.  Make it your goal to take part in all treatment decisions (shared decision-making). This includes giving input on the side effects of medicines. It is best if shared decision-making with your health care provider is part of your total treatment plan. If your health care provider prescribes a medicine, you may not notice the full benefits of it for 4-8 weeks. Most people who are treated for depression need to be on medicine for at least 6-12 months after they feel better. If you are taking medicines as part of your treatment, do not stop taking medicines without first talking to your health care provider. You may need to have the medicine slowly decreased (tapered) over time to decrease the risk of harmful side effects. Relationships Your health care provider may suggest family therapy along with individual therapy and drug therapy. While there may not be family problems that are causing you to feel depressed, it is still important to make sure your family learns as much as they can about your mental health. Having your familys support can help make your treatment successful. How to recognize changes in your condition Everyone has a different response to treatment for depression. Recovery from major depression happens when you have not had  signs of major depression for two  months. This may mean that you will start to:  Have more interest in doing activities.  Feel less hopeless than you did 2 months ago.  Have more energy.  Overeat less often, or have better or improving appetite.  Have better concentration. Your health care provider will work with you to decide the next steps in your recovery. It is also important to recognize when your condition is getting worse. Watch for these signs:  Having fatigue or low energy.  Eating too much or too little.  Sleeping too much or too little.  Feeling restless, agitated, or hopeless.  Having trouble concentrating or making decisions.  Having unexplained physical complaints.  Feeling irritable, angry, or aggressive. Get help as soon as you or your family members notice these symptoms coming back. How to get support and help from others How to talk with friends and family members about your condition  Talking to friends and family members about your condition can provide you with one way to get support and guidance. Reach out to trusted friends or family members, explain your symptoms to them, and let them know that you are working with a health care provider to treat your depression. Financial resources Not all insurance plans cover mental health care, so it is important to check with your insurance carrier. If paying for co-pays or counseling services is a problem, search for a local or county mental health care center. They may be able to offer public mental health care services at low or no cost when you are not able to see a private health care provider. If you are taking medicine for depression, you may be able to get the generic form, which may be less expensive. Some makers of prescription medicines also offer help to patients who cannot afford the medicines they need. Follow these instructions at home:   Get the right amount and quality of sleep.  Cut down on using caffeine, tobacco, alcohol, and  other potentially harmful substances.  Try to exercise, such as walking or lifting small weights.  Take over-the-counter and prescription medicines only as told by your health care provider.  Eat a healthy diet that includes plenty of vegetables, fruits, whole grains, low-fat dairy products, and lean protein. Do not eat a lot of foods that are high in solid fats, added sugars, or salt.  Keep all follow-up visits as told by your health care provider. This is important. Contact a health care provider if:  You stop taking your antidepressant medicines, and you have any of these symptoms: ? Nausea. ? Headache. ? Feeling lightheaded. ? Chills and body aches. ? Not being able to sleep (insomnia).  You or your friends and family think your depression is getting worse. Get help right away if:  You have thoughts of hurting yourself or others. If you ever feel like you may hurt yourself or others, or have thoughts about taking your own life, get help right away. You can go to your nearest emergency department or call:  Your local emergency services (911 in the [place]  A suicide crisis helpline, such as the [facility] at (640)462-6069. This is open 24-hours a day. Summary  If you are living with depression, there are ways to help you recover from it and also ways to prevent it from coming back.  Work with your health care team to create a management plan that includes counseling, stress management techniques, and healthy lifestyle habits. This  information is not intended to replace advice given to you by your health care provider. Make sure you discuss any questions you have with your health care provider. Document Released: 02/03/2016 Document Revised: 06/24/2018 Document Reviewed: 02/03/2016 Elsevier Patient Education  2020 [employer].

## 2018-12-15 NOTE — Progress Notes (Addendum)
Subjective:    Patient ID: Donna May, female    DOB: 04-11-1940, 78 y.o.   MRN: [id number]  No chief complaint on file.   HPI Patient was seen today for f/u.  Pt seen yesterday by Oncology, Dr. Burr Medico for intrahepatic cholangiocarcinoma.  Pt receiving Gemcitabine and Oxaliplatin infusion for chemo.  Endorses sensitivity to cold and fingertips several days after chemotherapy treatment.  Pt also notes bp elevation when going to oncology appointments.  States she is nervous.  FSBS have been stable at home, followed by Dr. Dwyane Dee.  Past Medical History:  Diagnosis Date  . Anemia   . Anxiety   . Arthritis   . Breast cancer ([place]) 1992  . Cataract    Bilateral eyes - surgery to remove  . Chronic kidney disease    CKD stage 3  . Complication of anesthesia    "something they use make me itch for a couple of days."  . Depression   . Diabetes mellitus    type 2  . Duodenitis 01/18/2002  . Fainting spell   . Family history of breast cancer   . Family history of prostate cancer   . GERD (gastroesophageal reflux disease)   . Heart murmur    never has caused any problems  . Hiatal hernia 08/08/2008, 01/18/2002  . History of pneumonia    x 2  . Hyperlipidemia   . Hypertension   . Liver cancer ([place]) 08/2018  . Lymphedema 2017   Right arm  . Pneumonia    x 2  . UTI (lower urinary tract infection)     No Known Allergies  ROS General: Denies fever, chills, night sweats, changes in weight, changes in appetite HEENT: Denies headaches, ear pain, changes in vision, rhinorrhea, sore throat CV: Denies CP, palpitations, SOB, orthopnea Pulm: Denies SOB, cough, wheezing GI: Denies abdominal pain, nausea, vomiting, diarrhea, constipation GU: Denies dysuria, hematuria, frequency, vaginal discharge Msk: Denies muscle cramps, joint pains Neuro: Denies weakness, numbness, tingling Skin: Denies rashes, bruising Psych: Denies depression, anxiety, hallucinations      Objective:    Blood  pressure 132/60, pulse (!) 59, temperature 98 F (36.7 C), temperature source Temporal, weight 148 lb (67.1 kg), SpO2 96 %.   Gen. Pleasant, well-nourished, in no distress, normal affect   HEENT: Horace/AT, face symmetric, no scleral icterus, PERRLA, EOMI, nares patent without drainage Lungs: no accessory muscle use, CTAB, no wheezes or rales Cardiovascular: RRR, no m/r/g, no peripheral edema Abdomen: BS present, soft, NT/ND. Musculoskeletal: No deformities, no cyanosis or clubbing, normal tone Neuro:  A&Ox3, CN II-XII intact, normal gait Skin:  Warm, no lesions/ rash   Wt Readings from Last 3 Encounters:  12/15/18 148 lb (67.1 kg)  12/14/18 151 lb 1.6 oz (68.5 kg)  12/05/18 146 lb 6.4 oz (66.4 kg)    Lab Results  Component Value Date   WBC 4.7 12/14/2018   HGB 10.7 (L) 12/14/2018   HCT 34.3 (L) 12/14/2018   PLT 279 12/14/2018   GLUCOSE 105 (H) 12/14/2018   CHOL 190 09/01/2018   TRIG 46.0 09/01/2018   HDL 60.50 09/01/2018   LDLCALC 120 (H) 09/01/2018   ALT 19 12/14/2018   AST 26 12/14/2018   NA 143 12/14/2018   K 4.2 12/14/2018   CL 106 12/14/2018   CREATININE 1.28 (H) 12/14/2018   BUN 18 12/14/2018   CO2 25 12/14/2018   TSH 1.48 04/26/2017   INR 1.1 11/09/2018   HGBA1C 6.4 (H) 11/09/2018   MICROALBUR  0.9 09/01/2018    Assessment/Plan:  Type 2 Diabetes mellitus with stage 3b chronic kidney disease, without long-term current use of insulin ([place]) -controlled -last hgb A1C 6.45 on 11/09/18 -continue current meds: metformin and farxiga.  Previously on Januvia and metformin. -discussed lifestyle modifications -pt encouraged to check fsbs regularly. -continue f/u with Endocrinology  Essential hypertension -improving. -likely elevated at Chemo as pt is nervous vs effects of chemo meds -discussed lifestyle modifications -Creatine 1.28 on 9/30.  Baseline 1.2-1.4 over the last 2 yrs.  H/o renal artery stenosis.  -continue norvasc 10 mg, HCTZ 25 mg, toprol XL 100 mg, and  valsartan 320 mg  Intrahepatic cholangiocarcinoma (HCC) -confirmed with liver biopsy -Colonoscopy 09/23/18 and PET scan 09/29/18 negative for mets, however found to have peritoneal mets to R diaphragm 9/3 -continue oxaliplatin and gemcitabine q 2 wks.  Goal of therapy palliative -continue f/u with Oncology, Dr. Burr Medico  F/u in 3 months  Grier Mitts, MD

## 2018-12-15 NOTE — Telephone Encounter (Signed)
No los per 9/30.

## 2018-12-19 ENCOUNTER — Encounter: Payer: Self-pay | Admitting: Family Medicine

## 2018-12-27 DIAGNOSIS — N183 Chronic kidney disease, stage 3 unspecified: Secondary | ICD-10-CM | POA: Diagnosis not present

## 2018-12-27 DIAGNOSIS — E1122 Type 2 diabetes mellitus with diabetic chronic kidney disease: Secondary | ICD-10-CM | POA: Diagnosis not present

## 2018-12-27 DIAGNOSIS — I129 Hypertensive chronic kidney disease with stage 1 through stage 4 chronic kidney disease, or unspecified chronic kidney disease: Secondary | ICD-10-CM | POA: Diagnosis not present

## 2018-12-27 DIAGNOSIS — C221 Intrahepatic bile duct carcinoma: Secondary | ICD-10-CM | POA: Diagnosis not present

## 2018-12-27 DIAGNOSIS — N1832 Chronic kidney disease, stage 3b: Secondary | ICD-10-CM | POA: Diagnosis not present

## 2018-12-27 NOTE — Progress Notes (Signed)
Donna May   Telephone:(336) [phone] Fax:(336) [phone]   Clinic Follow up Note   Patient Care Team: [name], MD as PCP - General (Family Medicine) [name], [name] Dad, MD as Consulting Physician (Oncology) [name], [name], MD as Consulting Physician (Cardiology) Donna Bruins, MD as Consulting Physician (Obstetrics and Gynecology) Donna Bison, [name], NP as Nurse Practitioner (Hematology and Oncology) Donna May, P.A. Donna Merle, MD as Consulting Physician (Hematology) [name], [name], MD as Consulting Physician (Gastroenterology) Donna Snipe, RN as Oncology Nurse Navigator Donna Klein, MD as Consulting Physician (General Surgery) 12/28/2018  CHIEF COMPLAINT: F/u cholangiocarcinoma   SUMMARY OF ONCOLOGIC HISTORY: Oncology History  Malignant neoplasm of female breast ([place])  11/06/2018 Genetic Testing   Negative genetic testing on the [employer] Common Hereditary Cancers panel. A variant of uncertain significance was identified in one of her APC genes, called c.1243G>A (p.Ala415Thr).  The Common Hereditary Cancers Panel offered by [employer] includes sequencing and/or deletion duplication testing of the following 48 genes: APC, ATM, AXIN2, BARD1, BMPR1A, BRCA1, BRCA2, BRIP1, CDH1, CDK4, CDKN2A (p14ARF), CDKN2A (p16INK4a), CHEK2, CTNNA1, DICER1, EPCAM (Deletion/duplication testing only), GREM1 (promoter region deletion/duplication testing only), KIT, MEN1, MLH1, MSH2, MSH3, MSH6, MUTYH, NBN, NF1, NHTL1, PALB2, PDGFRA, PMS2, POLD1, POLE, PTEN, RAD50, RAD51C, RAD51D, RNF43, SDHB, SDHC, SDHD, SMAD4, SMARCA4. STK11, TP53, TSC1, TSC2, and VHL.  The following genes were evaluated for sequence changes only: SDHA and HOXB13 c.251G>A variant only.    11/30/2018 -  Chemotherapy   First line chemo oxaliplatin and gemcitabine q2weeks starting 11/30/18.    Intrahepatic cholangiocarcinoma ([place])  09/07/2018 Imaging   CT Chest IMPRESSION: 1. New,  enhancing mass involving segment 4 of the liver and fundus of gallbladder is concerning for malignancy. This may represent either metastatic disease from breast cancer or neoplasm primary to the liver or hepatic biliary tree. Further evaluation with contrast enhanced CT of the abdomen and pelvis is recommended. 2. No findings to suggest metastatic disease within the chest. 3.  Aortic Atherosclerosis (ICD10-I70.0). 4. Coronary artery calcifications.   09/13/2018 Pathology Results   Diagnosis Liver, needle/core biopsy - ADENOCARCINOMA. Microscopic Comment Immunohistochemistry for CK7 is positive. CK20, TTF1, CDX-2, GATA-3, PAX 8, Qualitative ER, p63 and CK5/6 are negative. The provided clinical history of remote mammary carcinoma is noted. Based on the morphology and immunophenotype of the adenocarcinoma observed in this specimen, primary cholangiocarcinoma is favored. Clinical and radiologic correlation are  encouraged. Results reported to [employer] on 09/15/2018. Intradepartmental consultation (Dr. Vic Ripper).   09/13/2018 Initial Diagnosis   Cholangiocarcinoma ([place])   09/23/2018 Procedure   Colonoscopy by Dr. Havery May 09/23/18  IMPRESSION - Two 3 to 4 mm polyps in the ascending colon, removed with a cold snare. Resected and retrieved. - Five 3 to 5 mm polyps in the transverse colon, removed with a cold snare. Resected and retrieved. - One 5 mm polyp at the splenic flexure, removed with a cold snare. Resected and retrieved. - Three 3 to 5 mm polyps in the sigmoid colon, removed with a cold snare. Resected and retrieved. - The examination was otherwise normal. Upper Endopscy by Dr. Havery May 09/23/18  IMPRESSION - Esophagogastric landmarks identified. - 2 cm hiatal hernia. - Normal esophagus otherwise. - A single gastric polyp. Resected and retrieved. - Mild gastritis. Biopsied. - Normal duodenal bulb and second portion of the duodenum.   09/23/2018 Pathology Results   Diagnosis  09/23/18 1. Surgical [P], duodenum - BENIGN SMALL BOWEL MUCOSA. - NO ACTIVE INFLAMMATION OR VILLOUS ATROPHY IDENTIFIED. 2. Surgical [  P], stomach, polyp - HYPERPLASTIC POLYP(S). - THERE IS NO EVIDENCE OF MALIGNANCY. 3. Surgical [P], gastric antrum and gastric body - CHRONIC INACTIVE GASTRITIS. - THERE IS NO EVIDENCE OF HELICOBACTER-PYLORI, DYSPLASIA, OR MALIGNANCY. - SEE COMMENT. 4. Surgical [P], colon, sigmoid, splenic flexure, transverse and ascending, polyp (9) - TUBULAR ADENOMA(S). - SESSILE SERRATED POLYP WITHOUT CYTOLOGIC DYSPLASIA. - HIGH GRADE DYSPLASIA IS NOT IDENTIFIED. 5. Surgical [P], colon, sigmoid, polyp (2) - HYPERPLASTIC POLYP(S). - THERE IS NO EVIDENCE OF MALIGNANCY.   09/23/2018 Cancer Staging   Staging form: Intrahepatic Bile Duct, AJCC 8th Edition - Clinical stage from 09/23/2018: Stage IB (cT1b, cN0, cM0) - Signed by Donna Merle, MD on 10/06/2018   09/29/2018 PET scan   PET 09/29/18 IMPRESSION: 1. Hypermetabolic mass in the RIGHT hepatic lobe consistent with biopsy proven adenocarcinoma. No additional liver metastasis. 2. No evidence of local breast cancer recurrence in the RIGHT breast or RIGHT axilla. 3. Mild bilateral hypermetabolic adrenal glands is favored benign hyperplasia. 4. No evidence of additional metastatic disease on skull base to thigh FDG PET scan.   11/06/2018 Genetic Testing   Negative genetic testing on the [employer] Common Hereditary Cancers panel. A variant of uncertain significance was identified in one of her APC genes, called c.1243G>A (p.Ala415Thr).  The Common Hereditary Cancers Panel offered by [facility] includes sequencing and/or deletion duplication testing of the following 48 genes: APC, ATM, AXIN2, BARD1, BMPR1A, BRCA1, BRCA2, BRIP1, CDH1, CDK4, CDKN2A (p14ARF), CDKN2A (p16INK4a), CHEK2, CTNNA1, DICER1, EPCAM (Deletion/duplication testing only), GREM1 (promoter region deletion/duplication testing only), KIT, MEN1, MLH1, MSH2, MSH3, MSH6,  MUTYH, NBN, NF1, NHTL1, PALB2, PDGFRA, PMS2, POLD1, POLE, PTEN, RAD50, RAD51C, RAD51D, RNF43, SDHB, SDHC, SDHD, SMAD4, SMARCA4. STK11, TP53, TSC1, TSC2, and VHL.  The following genes were evaluated for sequence changes only: SDHA and HOXB13 c.251G>A variant only.    11/17/2018 Pathology Results   Diagnosis 11/17/18 1. Soft tissue, biopsy, Diaphragmatic nodules - METASTATIC ADENOCARCINOMA, CONSISTENT WITH PATIENT'S CLINICAL HISTORY OF CHOLANGIOCARCINOMA. SEE NOTE 2. Liver, biopsy, Left - LIVER PARENCHYMA WITH A BENIGN FIBROTIC NODULE - NO EVIDENCE OF MALIGNANCY 3. Stomach, biopsy - BENIGN PAPILLARY MESOTHELIAL HYPERPLASIA - NO EVIDENCE OF MALIGNANCY   11/29/2018 Imaging   CT CAP WO Contrast  IMPRESSION: 1. Dominant liver mass appears grossly stable from 09/29/2018. Additional liver lesions are too small to characterize but were not shown to be hypermetabolic on PET. 2. Mild nodularity of both adrenal glands with associated hypermetabolism on 09/29/2018. Continued attention on follow-up exams is warranted. 3. Small right lower lobe nodules, stable from 09/07/2018. Again, attention on follow-up is recommended. 4. Trace bilateral pleural fluid. 5. Aortic atherosclerosis (ICD10-170.0). Coronary artery calcification. 6. Enlarged pulmonic trunk, indicative of pulmonary arterial hypertension.     11/30/2018 -  Chemotherapy   First line chemo oxaliplatin and gemcitabine q2weeks starting 11/30/18.      CURRENT THERAPY: First line chemooxaliplatinand gemcitabine q2weeksstarting 11/30/18.  INTERVAL HISTORY: Ms. Kuri returns for f/u and treatment as scheduled. She completed cycle 2 on 12/14/18. She fell 2 weeks ago in the store and hit her head, was wearing a hat which protected her. Did not lose consciousness. EMS was called but she declined transport to ED. She was lightheaded and dizzy and she knows it was related to her BG. She drank Pepsi and symptoms resolved. This has happened before.  She does not check her BG at home because she gets stuck so much. She has mild cold sensitivity in throat and fingertips that is improving. No other neuropathy. Denies  mucositis. Eats and drinks well. Uses premier protein supplement as needed. Manages chronic constipation with diet change. Denies n/v. No abdominal pain. Seldom takes pain meds or anti-emetics. Denies fever, chills, cough, chest pain, dyspnea, or swelling.   MEDICAL HISTORY:  Past Medical History:  Diagnosis Date   Anemia    Anxiety    Arthritis    Breast cancer ([place]) 1992   Cataract    Bilateral eyes - surgery to remove   Chronic kidney disease    CKD stage 3   Complication of anesthesia    "something they use make me itch for a couple of days."   Depression    Diabetes mellitus    type 2   Duodenitis 01/18/2002   Fainting spell    Family history of breast cancer    Family history of prostate cancer    GERD (gastroesophageal reflux disease)    Heart murmur    never has caused any problems   Hiatal hernia 08/08/2008, 01/18/2002   History of pneumonia    x 2   Hyperlipidemia    Hypertension    Liver cancer ([place]) 08/2018   Lymphedema 2017   Right arm   Pneumonia    x 2   UTI (lower urinary tract infection)     SURGICAL HISTORY: Past Surgical History:  Procedure Laterality Date   AXILLARY SURGERY     cyst removal, right   COLONOSCOPY  09/23/2018   Dr. Havery May - polyps   EYE SURGERY Bilateral    cataracts to remove   LAPAROSCOPY N/A 11/17/2018   Procedure: LAPAROSCOPY DIAGNOSTIC, INTRAOPERATIVE ULTRASOUND, PERITONEAL BIOPSIES;  Surgeon: Donna Klein, MD;  Location: [facility];  Service: General;  Laterality: N/A;  GENERAL AND EPIDURAL   LIVER BIOPSY  08/2018   Dr. Lindwood Coke   MASTECTOMY  1992   right, with flap   NM [place]  06/11/2009   Protocol:[place], post stress EF58%, EKG negative for ischemia, low risk   PORTACATH PLACEMENT N/A 12/02/2018   Procedure:  INSERTION PORT-A-CATH;  Surgeon: Donna Klein, MD;  Location: [facility];  Service: General;  Laterality: N/A;   RECONSTRUCTION BREAST W/ TRAM FLAP Right    TONSILLECTOMY     TRANSTHORACIC ECHOCARDIOGRAM  12/24/2009   LVEF =>55%, normal study   UPPER GI ENDOSCOPY      I have reviewed the social history and family history with the patient and they are unchanged from previous note.  ALLERGIES:  has No Known Allergies.  MEDICATIONS:  Current Outpatient Medications  Medication Sig Dispense Refill   amLODipine (NORVASC) 10 MG tablet TAKE 1 TABLET BY MOUTH EVERY DAY (Patient taking differently: Take 10 mg by mouth daily. ) 90 tablet 2   aspirin 81 MG tablet Take 81 mg by mouth at bedtime.      CALCIUM-MAGNESIUM-VITAMIN D PO Take 1 tablet by mouth once a week.      Cholecalciferol (VITAMIN D3) 2000 UNITS TABS Take 1 tablet by mouth once a week.      Echinacea 400 MG CAPS Take 400 mg by mouth daily.      glucose blood (ACCU-CHEK AVIVA) test strip Use as instructed 100 each 4   hydrochlorothiazide (HYDRODIURIL) 25 MG tablet TAKE 1 TABLET DAILY (TO    REPLACE COMBINATION) 90 tablet 0   HYDROcodone-acetaminophen (NORCO/VICODIN) 5-325 MG tablet Take 1 tablet by mouth every 6 (six) hours as needed for moderate pain. 10 tablet 0   HYDROcodone-acetaminophen (NORCO/VICODIN) 5-325 MG tablet Take 1 tablet by  mouth every 6 (six) hours as needed for moderate pain. 10 tablet 0   ketoconazole (NIZORAL) 2 % cream Apply 1 fingertip amount to each foot daily. (Patient taking differently: Apply 1 application topically daily as needed (affected area of foot). Apply 1 fingertip amount to each foot daily.) 30 g 0   lansoprazole (PREVACID) 15 MG capsule TAKE 1 CAPSULE DAILY BEFOREBREAKFAST (NEED TO MAKE AN OFFICE VISIT FOR FURTHER   REFILLS) (Patient taking differently: Take 15 mg by mouth daily. ) 90 capsule 1   lidocaine-prilocaine (EMLA) cream Apply 1 application topically as needed. 30 g 0    lidocaine-prilocaine (EMLA) cream Apply to affected area once 30 g 3   loratadine (CLARITIN) 10 MG tablet Take 10 mg by mouth daily as needed for allergies.     metFORMIN (GLUCOPHAGE-XR) 500 MG 24 hr tablet Take 1 tablet (500 mg total) by mouth 2 (two) times daily before a meal. 180 tablet 1   metoprolol succinate (TOPROL-XL) 100 MG 24 hr tablet TAKE 1 TABLET (100 MG TOTAL) BY MOUTH DAILY. TAKE WITH OR IMMEDIATELY FOLLOWING A MEAL. 90 tablet 1   Multiple Vitamins-Minerals (CENTRUM SILVER ULTRA WOMENS) TABS Take 1 tablet by mouth daily.      Omega 3 1000 MG CAPS Take 2,000 mg by mouth daily.      ondansetron (ZOFRAN) 8 MG tablet Take 1 tablet (8 mg total) by mouth every 8 (eight) hours as needed for nausea or vomiting. 30 tablet 0   prochlorperazine (COMPAZINE) 10 MG tablet Take 1 tablet (10 mg total) by mouth every 6 (six) hours as needed for nausea or vomiting. 30 tablet 0   raloxifene (EVISTA) 60 MG tablet TAKE 1 TABLET DAILY 90 tablet 0   rosuvastatin (CRESTOR) 20 MG tablet Take 1 tablet (20 mg total) by mouth daily. (Patient taking differently: Take 20 mg by mouth at bedtime. ) 90 tablet 0   sitaGLIPtin (JANUVIA) 100 MG tablet Take 1 tablet (100 mg total) by mouth daily. 90 tablet 4   triamcinolone cream (KENALOG) 0.1 % Apply 1 application topically 2 (two) times daily. (Patient taking differently: Apply 1 application topically 2 (two) times daily as needed (rash). ) 30 g 0   valsartan (DIOVAN) 320 MG tablet TAKE 1 TABLET DAILY (TO    REPLACE COMBINATION) 90 tablet 0   Current Facility-Administered Medications  Medication Dose Route Frequency Provider Last Rate Last Dose   triamcinolone acetonide (KENALOG) 10 MG/ML injection 10 mg  10 mg Other Once Harriet Masson, DPM       Facility-Administered Medications Ordered in Other Visits  Medication Dose Route Frequency Provider Last Rate Last Dose   sodium chloride flush (NS) 0.9 % injection 10 mL  10 mL Intracatheter PRN Donna Merle,  MD   10 mL at 12/28/18 1612    PHYSICAL EXAMINATION: ECOG PERFORMANCE STATUS: 0 - Asymptomatic  Vitals:   12/28/18 1132 12/28/18 1138  BP: (!) 161/64 (!) 141/62  Pulse: 71   Resp: 17   Temp: 98.7 F (37.1 C)   SpO2: 100%    Filed Weights   12/28/18 1132  Weight: 147 lb 8 oz (66.9 kg)    GENERAL:alert, no distress and comfortable SKIN: no rash  EYES:  sclera clear LUNGS: clear with normal breathing effort HEART: regular rate & rhythm, no lower extremity edema ABDOMEN:abdomen soft, non-tender and normal bowel sounds Musculoskeletal:no cyanosis of digits  NEURO: alert & oriented x 3 with fluent speech, normal gait PAC without erythema   LABORATORY  DATA:  I have reviewed the data as listed CBC Latest Ref Rng & Units 12/28/2018 12/14/2018 12/05/2018  WBC 4.0 - 10.5 K/uL 6.8 4.7 3.0(L)  Hemoglobin 12.0 - 15.0 g/dL 9.5(L) 10.7(L) 10.9(L)  Hematocrit 36.0 - 46.0 % 30.1(L) 34.3(L) 34.2(L)  Platelets 150 - 400 K/uL 164 279 204     CMP Latest Ref Rng & Units 12/28/2018 12/14/2018 12/05/2018  Glucose 70 - 99 mg/dL 132(H) 105(H) 88  BUN 8 - 23 mg/dL '21 18 17  '$ Creatinine 0.44 - 1.00 mg/dL 1.38(H) 1.28(H) 1.09(H)  Sodium 135 - 145 mmol/L 144 143 139  Potassium 3.5 - 5.1 mmol/L 4.1 4.2 4.1  Chloride 98 - 111 mmol/L 109 106 104  CO2 22 - 32 mmol/L '26 25 26  '$ Calcium 8.9 - 10.3 mg/dL 8.8(L) 9.4 9.0  Total Protein 6.5 - 8.1 g/dL 6.8 6.9 6.6  Total Bilirubin 0.3 - 1.2 mg/dL <0.2(L) 0.2(L) 0.4  Alkaline Phos 38 - 126 U/L 65 63 59  AST 15 - 41 U/L 28 26 42(H)  ALT 0 - 44 U/L '14 19 9      '$ RADIOGRAPHIC STUDIES: I have personally reviewed the radiological images as listed and agreed with the findings in the report. No results found.   ASSESSMENT & PLAN: Donna May is a 78 y.o. female with   1.Intrahepatic cholangiocarcinoma, cT1N0M1,with peritoneal metastasis, MSS, IDH1 mutation (+) -Diagnosed in 09/2018 biopsy of her liver mass shows adenocarcinoma,most consistent  withcholangiocarcinoma -09/29/18 PET scanshowedno evidence of distant metastasis. -Unfortunately she was found to have peritoneal metastasis to right diaphragm and surgery was aborted. -Her CT AP from 9/15/20shows dominate liver mass stable to mild increase, mild nodularity of both adrenal glands which warrants being monitored.No visible peritoneal metastasis on CT scan. -Given her metastatic cancer and ineligibility for surgery, she began first-line systemic chemotherapy for disease control. Due to her CKD, she is not a candidate for cisplatin.  -FO results which showed MSI stable disease, IDH mutation positive.  I would consider IDH 1 inhibitor in the future.  Not a candidate for immunotherapy -She began oxaliplatin and gemcitabine every 2 weeks on 11/30/2018, s/p 2 cycles.   -Ms. Ronnald Ramp appears well today.  She tolerates treatment well overall, without significant toxicities.   -CBC and CMP are stable -Proceed with cycle 3 gemox today, no dosage adjustments -Plan to restage after 4 cycles -She will return for follow-up and cycle 4 every 2 weeks  2. H/o right breast cancer, Geneticsnegative  -s/p mastectomy in 1992, per patient did not require adjuvant therapy  -followed by Dr. Jana Hakim -VUS of gene APC; genetics otherwise normal  3.Comorbidities:DM, HTN, hiatal hernia, GERD, renal artery stenosis -Follow-up with PCP -Cr fluctuates, 1.38 today.  She saw her nephrologist yesterday. Continue monitoring -I encouraged her to hydrate and avoid nephrotoxic agents.  4. Family support  -she has a son and daughter,they were on the phone during her visit today  5.Goal of care discussion  -The patient understands the goal of care is palliative. -she is full code now  6. Hypoglycemic event -She fell at the store 2 weeks ago, she attributes to hypoglycemia but did not check sugar -BG 132 today, she does not check frequently at home. I encouraged her to consider checking more  often -I recommend to carry food especially during errands away from the house, and eat small frequent meals -she has f/u with endo Dr. Dwyane Dee next week -on metformin and januvia   PLAN: -Labs reviewed -Proceed with cycle 3  GEMOX -F/u with PCP as scheduled -Endocrine visit with Dr. Dwyane Dee 10/19, likely adjust DM meds given recent hypoglycemic event  -Continue f/u with nephrology  -f/u and cycle 4 in 2 weeks   No problem-specific Assessment & Plan notes found for this encounter.   Orders Placed This Encounter  Procedures   CT Abdomen Pelvis Wo Contrast    Standing Status:   Future    Standing Expiration Date:   12/28/2019    Order Specific Question:   ** REASON FOR EXAM (FREE TEXT)    Answer:   cholangiocarcinoma, restaging    Order Specific Question:   Preferred imaging location?    Answer:   [facility]    Order Specific Question:   Is Oral Contrast requested for this exam?    Answer:   No oral contrast    Order Specific Question:   Reason for No Oral Contrast    Answer:   Other    Order Specific Question:   Please answer why no oral contrast is requested    Answer:   CKD    Order Specific Question:   Radiology Contrast Protocol - do NOT remove file path    Answer:   [email or web address]   CT Chest Wo Contrast    Standing Status:   Future    Standing Expiration Date:   12/28/2019    Order Specific Question:   ** REASON FOR EXAM (FREE TEXT)    Answer:   cholangiocarcinoma, restaging    Order Specific Question:   Preferred imaging location?    Answer:   [facility]    Order Specific Question:   Radiology Contrast Protocol - do NOT remove file path    Answer:   [email or web address]   All questions were answered. The patient knows to call the clinic with any problems, questions or concerns. No barriers to learning was detected.     Alla Feeling, NP 12/28/18

## 2018-12-28 ENCOUNTER — Inpatient Hospital Stay: Payer: Medicare Other

## 2018-12-28 ENCOUNTER — Inpatient Hospital Stay (HOSPITAL_BASED_OUTPATIENT_CLINIC_OR_DEPARTMENT_OTHER): Payer: Medicare Other | Admitting: Nurse Practitioner

## 2018-12-28 ENCOUNTER — Encounter: Payer: Self-pay | Admitting: Nurse Practitioner

## 2018-12-28 ENCOUNTER — Inpatient Hospital Stay: Payer: Medicare Other | Attending: Adult Health

## 2018-12-28 ENCOUNTER — Other Ambulatory Visit: Payer: Self-pay

## 2018-12-28 VITALS — BP 141/62 | HR 71 | Temp 98.7°F | Resp 17 | Ht 65.0 in | Wt 147.5 lb

## 2018-12-28 DIAGNOSIS — I700 Atherosclerosis of aorta: Secondary | ICD-10-CM | POA: Diagnosis not present

## 2018-12-28 DIAGNOSIS — C786 Secondary malignant neoplasm of retroperitoneum and peritoneum: Secondary | ICD-10-CM | POA: Diagnosis not present

## 2018-12-28 DIAGNOSIS — I129 Hypertensive chronic kidney disease with stage 1 through stage 4 chronic kidney disease, or unspecified chronic kidney disease: Secondary | ICD-10-CM | POA: Insufficient documentation

## 2018-12-28 DIAGNOSIS — Z853 Personal history of malignant neoplasm of breast: Secondary | ICD-10-CM | POA: Insufficient documentation

## 2018-12-28 DIAGNOSIS — C221 Intrahepatic bile duct carcinoma: Secondary | ICD-10-CM

## 2018-12-28 DIAGNOSIS — Z5111 Encounter for antineoplastic chemotherapy: Secondary | ICD-10-CM | POA: Insufficient documentation

## 2018-12-28 DIAGNOSIS — E1122 Type 2 diabetes mellitus with diabetic chronic kidney disease: Secondary | ICD-10-CM | POA: Diagnosis not present

## 2018-12-28 DIAGNOSIS — Z95828 Presence of other vascular implants and grafts: Secondary | ICD-10-CM

## 2018-12-28 DIAGNOSIS — N189 Chronic kidney disease, unspecified: Secondary | ICD-10-CM | POA: Diagnosis not present

## 2018-12-28 DIAGNOSIS — E11649 Type 2 diabetes mellitus with hypoglycemia without coma: Secondary | ICD-10-CM | POA: Diagnosis not present

## 2018-12-28 LAB — CBC WITH DIFFERENTIAL (CANCER CENTER ONLY)
Abs Immature Granulocytes: 0.02 10*3/uL (ref 0.00–0.07)
Basophils Absolute: 0 10*3/uL (ref 0.0–0.1)
Basophils Relative: 0 %
Eosinophils Absolute: 0.1 10*3/uL (ref 0.0–0.5)
Eosinophils Relative: 1 %
HCT: 30.1 % — ABNORMAL LOW (ref 36.0–46.0)
Hemoglobin: 9.5 g/dL — ABNORMAL LOW (ref 12.0–15.0)
Immature Granulocytes: 0 %
Lymphocytes Relative: 28 %
Lymphs Abs: 1.9 10*3/uL (ref 0.7–4.0)
MCH: 29.1 pg (ref 26.0–34.0)
MCHC: 31.6 g/dL (ref 30.0–36.0)
MCV: 92 fL (ref 80.0–100.0)
Monocytes Absolute: 1.3 10*3/uL — ABNORMAL HIGH (ref 0.1–1.0)
Monocytes Relative: 20 %
Neutro Abs: 3.5 10*3/uL (ref 1.7–7.7)
Neutrophils Relative %: 51 %
Platelet Count: 164 10*3/uL (ref 150–400)
RBC: 3.27 MIL/uL — ABNORMAL LOW (ref 3.87–5.11)
RDW: 16.4 % — ABNORMAL HIGH (ref 11.5–15.5)
WBC Count: 6.8 10*3/uL (ref 4.0–10.5)
nRBC: 0 % (ref 0.0–0.2)

## 2018-12-28 LAB — CMP (CANCER CENTER ONLY)
ALT: 14 U/L (ref 0–44)
AST: 28 U/L (ref 15–41)
Albumin: 3.3 g/dL — ABNORMAL LOW (ref 3.5–5.0)
Alkaline Phosphatase: 65 U/L (ref 38–126)
Anion gap: 9 (ref 5–15)
BUN: 21 mg/dL (ref 8–23)
CO2: 26 mmol/L (ref 22–32)
Calcium: 8.8 mg/dL — ABNORMAL LOW (ref 8.9–10.3)
Chloride: 109 mmol/L (ref 98–111)
Creatinine: 1.38 mg/dL — ABNORMAL HIGH (ref 0.44–1.00)
GFR, Est AFR Am: 42 mL/min — ABNORMAL LOW (ref >60)
GFR, Estimated: 36 mL/min — ABNORMAL LOW (ref >60)
Glucose, Bld: 132 mg/dL — ABNORMAL HIGH (ref 70–99)
Potassium: 4.1 mmol/L (ref 3.5–5.1)
Sodium: 144 mmol/L (ref 135–145)
Total Bilirubin: <0.2 mg/dL — ABNORMAL LOW (ref 0.3–1.2)
Total Protein: 6.8 g/dL (ref 6.5–8.1)

## 2018-12-28 MED ORDER — SODIUM CHLORIDE 0.9% FLUSH
10.0000 mL | INTRAVENOUS | Status: DC | PRN
  Administered 2018-12-28: 10 mL
  Filled 2018-12-28: qty 10

## 2018-12-28 MED ORDER — SODIUM CHLORIDE 0.9% FLUSH
10.0000 mL | Freq: Once | INTRAVENOUS | Status: AC
  Administered 2018-12-28: 10 mL
  Filled 2018-12-28: qty 10

## 2018-12-28 MED ORDER — HEPARIN SOD (PORK) LOCK FLUSH 100 UNIT/ML IV SOLN
500.0000 [IU] | Freq: Once | INTRAVENOUS | Status: AC | PRN
  Administered 2018-12-28: 500 [IU]
  Filled 2018-12-28: qty 5

## 2018-12-28 MED ORDER — DEXAMETHASONE SODIUM PHOSPHATE 10 MG/ML IJ SOLN
10.0000 mg | Freq: Once | INTRAMUSCULAR | Status: AC
  Administered 2018-12-28: 10 mg via INTRAVENOUS

## 2018-12-28 MED ORDER — OXALIPLATIN CHEMO INJECTION 100 MG/20ML
85.0000 mg/m2 | Freq: Once | INTRAVENOUS | Status: AC
  Administered 2018-12-28: 150 mg via INTRAVENOUS
  Filled 2018-12-28: qty 10

## 2018-12-28 MED ORDER — DEXTROSE 5 % IV SOLN
Freq: Once | INTRAVENOUS | Status: AC
  Administered 2018-12-28: 14:00:00 via INTRAVENOUS
  Filled 2018-12-28: qty 250

## 2018-12-28 MED ORDER — DEXAMETHASONE SODIUM PHOSPHATE 10 MG/ML IJ SOLN
INTRAMUSCULAR | Status: AC
  Filled 2018-12-28: qty 1

## 2018-12-28 MED ORDER — PALONOSETRON HCL INJECTION 0.25 MG/5ML
INTRAVENOUS | Status: AC
  Filled 2018-12-28: qty 5

## 2018-12-28 MED ORDER — SODIUM CHLORIDE 0.9 % IV SOLN
Freq: Once | INTRAVENOUS | Status: AC
  Administered 2018-12-28: 12:00:00 via INTRAVENOUS
  Filled 2018-12-28: qty 250

## 2018-12-28 MED ORDER — SODIUM CHLORIDE 0.9 % IV SOLN
1000.0000 mg/m2 | Freq: Once | INTRAVENOUS | Status: AC
  Administered 2018-12-28: 1748 mg via INTRAVENOUS
  Filled 2018-12-28: qty 45.97

## 2018-12-28 MED ORDER — PALONOSETRON HCL INJECTION 0.25 MG/5ML
0.2500 mg | Freq: Once | INTRAVENOUS | Status: AC
  Administered 2018-12-28: 0.25 mg via INTRAVENOUS

## 2018-12-28 NOTE — Patient Instructions (Signed)
[facility] Discharge Instructions for Patients Receiving Chemotherapy  Today you received the following chemotherapy agents Gemcitabine (GEMZAR) & Oxaliplatin (PARAPLATIN).  To help prevent nausea and vomiting after your treatment, we encourage you to take your nausea medication as prescribed.   If you develop nausea and vomiting that is not controlled by your nausea medication, call the clinic.   BELOW ARE SYMPTOMS THAT SHOULD BE REPORTED IMMEDIATELY:  *FEVER GREATER THAN 100.5 F  *CHILLS WITH OR WITHOUT FEVER  NAUSEA AND VOMITING THAT IS NOT CONTROLLED WITH YOUR NAUSEA MEDICATION  *UNUSUAL SHORTNESS OF BREATH  *UNUSUAL BRUISING OR BLEEDING  TENDERNESS IN MOUTH AND THROAT WITH OR WITHOUT PRESENCE OF ULCERS  *URINARY PROBLEMS  *BOWEL PROBLEMS  UNUSUAL RASH Items with * indicate a potential emergency and should be followed up as soon as possible.  Feel free to call the clinic should you have any questions or concerns. The clinic phone number is [phone].  Please show the [facility] at check-in to the Emergency Department and triage nurse.  Coronavirus (COVID-19) Are you at risk?  Are you at risk for the Coronavirus (COVID-19)?  To be considered HIGH RISK for Coronavirus (COVID-19), you have to meet the following criteria:  . Traveled to [place], [place], [place], [place] or [place]; or in the [place] to [place], [place], [place], or [place]; and have fever, cough, and shortness of breath within the last 2 weeks of travel OR . Been in close contact with a person diagnosed with COVID-19 within the last 2 weeks and have fever, cough, and shortness of breath . IF YOU DO NOT MEET THESE CRITERIA, YOU ARE CONSIDERED LOW RISK FOR COVID-19.  What to do if you are HIGH RISK for COVID-19?  Marland Kitchen If you are having a medical emergency, call 911. . Seek medical care right away. Before you go to a doctor's office, urgent care or emergency department,  call ahead and tell them about your recent travel, contact with someone diagnosed with COVID-19, and your symptoms. You should receive instructions from your physician's office regarding next steps of care.  . When you arrive at healthcare provider, tell the healthcare staff immediately you have returned from visiting [place], [place], [place], [place] or [place]; or traveled in the [place] to [place], [place], [place], or [place]; in the last two weeks or you have been in close contact with a person diagnosed with COVID-19 in the last 2 weeks.   . Tell the health care staff about your symptoms: fever, cough and shortness of breath. . After you have been seen by a medical provider, you will be either: o Tested for (COVID-19) and discharged home on quarantine except to seek medical care if symptoms worsen, and asked to  - Stay home and avoid contact with others until you get your results (4-5 days)  - Avoid travel on public transportation if possible (such as bus, train, or airplane) or o Sent to the Emergency Department by EMS for evaluation, COVID-19 testing, and possible admission depending on your condition and test results.  What to do if you are LOW RISK for COVID-19?  Reduce your risk of any infection by using the same precautions used for avoiding the common cold or flu:  Marland Kitchen Wash your hands often with soap and warm water for at least 20 seconds.  If soap and water are not readily available, use an alcohol-based hand sanitizer with at least 60% alcohol.  Marland Kitchen  If coughing or sneezing, cover your mouth and nose by coughing or sneezing into the elbow areas of your shirt or coat, into a tissue or into your sleeve (not your hands). . Avoid shaking hands with others and consider head nods or verbal greetings only. . Avoid touching your eyes, nose, or mouth with unwashed hands.  . Avoid close contact with people who are sick. . Avoid places or events with large numbers of people in one  location, like concerts or sporting events. . Carefully consider travel plans you have or are making. . If you are planning any travel outside or inside the [place], visit the CDC's Travelers' Health webpage for the latest health notices. . If you have some symptoms but not all symptoms, continue to monitor at home and seek medical attention if your symptoms worsen. . If you are having a medical emergency, call 911.   ADDITIONAL HEALTHCARE OPTIONS FOR PATIENTS  [facility] / e-Visit: [email or web address]         [facility]: [phone]  [facility]: [phone]                   [facility]: [phone]   

## 2018-12-29 ENCOUNTER — Telehealth: Payer: Self-pay | Admitting: Nurse Practitioner

## 2018-12-29 NOTE — Telephone Encounter (Signed)
Scheduled appt per 10/14 los.

## 2018-12-30 ENCOUNTER — Other Ambulatory Visit: Payer: Self-pay

## 2018-12-30 ENCOUNTER — Other Ambulatory Visit: Payer: Self-pay | Admitting: Endocrinology

## 2018-12-30 ENCOUNTER — Other Ambulatory Visit: Payer: Medicare Other

## 2018-12-30 DIAGNOSIS — E1140 Type 2 diabetes mellitus with diabetic neuropathy, unspecified: Secondary | ICD-10-CM

## 2018-12-31 LAB — FRUCTOSAMINE: Fructosamine: 223 umol/L (ref 0–285)

## 2019-01-02 NOTE — Progress Notes (Signed)
[place]   [phone]) [phone] Fax:(336) [phone]   Clinic Follow up Note   Patient Care Team: [name], MD as PCP - General (Family Medicine) [name], [name] Dad, MD as Consulting Physician (Oncology) [name], [name], MD as Consulting Physician (Cardiology) [name], MD as Consulting Physician (Obstetrics and Gynecology) [name], [name], NP as Nurse Practitioner (Hematology and Oncology) [facility] [name], MD as Consulting Physician (Hematology) [name], [name], MD as Consulting Physician (Gastroenterology) [name], RN as Oncology Nurse Navigator [name], MD as Consulting Physician (General Surgery)  Date of Service:  01/11/2019  CHIEF COMPLAINT: F/u ofcholangiocarcinomaof liver  SUMMARY OF ONCOLOGIC HISTORY: Oncology History  Malignant neoplasm of female breast ([place])  11/06/2018 Genetic Testing   Negative genetic testing on the [facility] Common Hereditary Cancers panel. A variant of uncertain significance was identified in one of her APC genes, called c.1243G>A (p.Ala415Thr).  The Common Hereditary Cancers Panel offered by [facility] includes sequencing and/or deletion duplication testing of the following 48 genes: APC, ATM, AXIN2, BARD1, BMPR1A, BRCA1, BRCA2, BRIP1, CDH1, CDK4, CDKN2A (p14ARF), CDKN2A (p16INK4a), CHEK2, CTNNA1, DICER1, EPCAM (Deletion/duplication testing only), GREM1 (promoter region deletion/duplication testing only), KIT, MEN1, MLH1, MSH2, MSH3, MSH6, MUTYH, NBN, NF1, NHTL1, PALB2, PDGFRA, PMS2, POLD1, POLE, PTEN, RAD50, RAD51C, RAD51D, RNF43, SDHB, SDHC, SDHD, SMAD4, SMARCA4. STK11, TP53, TSC1, TSC2, and VHL.  The following genes were evaluated for sequence changes only: SDHA and HOXB13 c.251G>A variant only.    11/30/2018 -  Chemotherapy   First line chemo oxaliplatin and gemcitabine q2weeks starting 11/30/18.    Intrahepatic cholangiocarcinoma ([place])  09/07/2018 Imaging   CT  Chest IMPRESSION: 1. New, enhancing mass involving segment 4 of the liver and fundus of gallbladder is concerning for malignancy. This may represent either metastatic disease from breast cancer or neoplasm primary to the liver or hepatic biliary tree. Further evaluation with contrast enhanced CT of the abdomen and pelvis is recommended. 2. No findings to suggest metastatic disease within the chest. 3.  Aortic Atherosclerosis (ICD10-I70.0). 4. Coronary artery calcifications.   09/13/2018 Pathology Results   Diagnosis Liver, needle/core biopsy - ADENOCARCINOMA. Microscopic Comment Immunohistochemistry for CK7 is positive. CK20, TTF1, CDX-2, GATA-3, PAX 8, Qualitative ER, p63 and CK5/6 are negative. The provided clinical history of remote mammary carcinoma is noted. Based on the morphology and immunophenotype of the adenocarcinoma observed in this specimen, primary cholangiocarcinoma is favored. Clinical and radiologic correlation are  encouraged. Results reported to [employer] on 09/15/2018. Intradepartmental consultation (Dr. Vic Ripper).   09/13/2018 Initial Diagnosis   Cholangiocarcinoma ([place])   09/23/2018 Procedure   Colonoscopy by Dr. Havery Moros 09/23/18  IMPRESSION - Two 3 to 4 mm polyps in the ascending colon, removed with a cold snare. Resected and retrieved. - Five 3 to 5 mm polyps in the transverse colon, removed with a cold snare. Resected and retrieved. - One 5 mm polyp at the splenic flexure, removed with a cold snare. Resected and retrieved. - Three 3 to 5 mm polyps in the sigmoid colon, removed with a cold snare. Resected and retrieved. - The examination was otherwise normal. Upper Endopscy by Dr. Havery Moros 09/23/18  IMPRESSION - Esophagogastric landmarks identified. - 2 cm hiatal hernia. - Normal esophagus otherwise. - A single gastric polyp. Resected and retrieved. - Mild gastritis. Biopsied. - Normal duodenal bulb and second portion of the duodenum.   09/23/2018  Pathology Results   Diagnosis 09/23/18 1. Surgical [P], duodenum - BENIGN SMALL BOWEL MUCOSA. - NO ACTIVE INFLAMMATION OR  VILLOUS ATROPHY IDENTIFIED. 2. Surgical [P], stomach, polyp - HYPERPLASTIC POLYP(S). - THERE IS NO EVIDENCE OF MALIGNANCY. 3. Surgical [P], gastric antrum and gastric body - CHRONIC INACTIVE GASTRITIS. - THERE IS NO EVIDENCE OF HELICOBACTER-PYLORI, DYSPLASIA, OR MALIGNANCY. - SEE COMMENT. 4. Surgical [P], colon, sigmoid, splenic flexure, transverse and ascending, polyp (9) - TUBULAR ADENOMA(S). - SESSILE SERRATED POLYP WITHOUT CYTOLOGIC DYSPLASIA. - HIGH GRADE DYSPLASIA IS NOT IDENTIFIED. 5. Surgical [P], colon, sigmoid, polyp (2) - HYPERPLASTIC POLYP(S). - THERE IS NO EVIDENCE OF MALIGNANCY.   09/23/2018 Cancer Staging   Staging form: Intrahepatic Bile Duct, AJCC 8th Edition - Clinical stage from 09/23/2018: Stage IB (cT1b, cN0, cM0) - Signed by [name], MD on 10/06/2018   09/29/2018 PET scan   PET 09/29/18 IMPRESSION: 1. Hypermetabolic mass in the RIGHT hepatic lobe consistent with biopsy proven adenocarcinoma. No additional liver metastasis. 2. No evidence of local breast cancer recurrence in the RIGHT breast or RIGHT axilla. 3. Mild bilateral hypermetabolic adrenal glands is favored benign hyperplasia. 4. No evidence of additional metastatic disease on skull base to thigh FDG PET scan.   11/06/2018 Genetic Testing   Negative genetic testing on the [facility] Common Hereditary Cancers panel. A variant of uncertain significance was identified in one of her APC genes, called c.1243G>A (p.Ala415Thr).  The Common Hereditary Cancers Panel offered by [facility] includes sequencing and/or deletion duplication testing of the following 48 genes: APC, ATM, AXIN2, BARD1, BMPR1A, BRCA1, BRCA2, BRIP1, CDH1, CDK4, CDKN2A (p14ARF), CDKN2A (p16INK4a), CHEK2, CTNNA1, DICER1, EPCAM (Deletion/duplication testing only), GREM1 (promoter region deletion/duplication testing only), KIT,  MEN1, MLH1, MSH2, MSH3, MSH6, MUTYH, NBN, NF1, NHTL1, PALB2, PDGFRA, PMS2, POLD1, POLE, PTEN, RAD50, RAD51C, RAD51D, RNF43, SDHB, SDHC, SDHD, SMAD4, SMARCA4. STK11, TP53, TSC1, TSC2, and VHL.  The following genes were evaluated for sequence changes only: SDHA and HOXB13 c.251G>A variant only.    11/17/2018 Pathology Results   Diagnosis 11/17/18 1. Soft tissue, biopsy, Diaphragmatic nodules - METASTATIC ADENOCARCINOMA, CONSISTENT WITH PATIENT'S CLINICAL HISTORY OF CHOLANGIOCARCINOMA. SEE NOTE 2. Liver, biopsy, Left - LIVER PARENCHYMA WITH A BENIGN FIBROTIC NODULE - NO EVIDENCE OF MALIGNANCY 3. Stomach, biopsy - BENIGN PAPILLARY MESOTHELIAL HYPERPLASIA - NO EVIDENCE OF MALIGNANCY   11/29/2018 Imaging   CT CAP WO Contrast  IMPRESSION: 1. Dominant liver mass appears grossly stable from 09/29/2018. Additional liver lesions are too small to characterize but were not shown to be hypermetabolic on PET. 2. Mild nodularity of both adrenal glands with associated hypermetabolism on 09/29/2018. Continued attention on follow-up exams is warranted. 3. Small right lower lobe nodules, stable from 09/07/2018. Again, attention on follow-up is recommended. 4. Trace bilateral pleural fluid. 5. Aortic atherosclerosis (ICD10-170.0). Coronary artery calcification. 6. Enlarged pulmonic trunk, indicative of pulmonary arterial hypertension.     11/30/2018 -  Chemotherapy   First line chemo oxaliplatin and gemcitabine q2weeks starting 11/30/18.       CURRENT THERAPY:  First line chemooxaliplatinand gemcitabine q2weeksstarting 11/30/18.  INTERVAL HISTORY:  SADIYAH KANGAS is here for a follow up and treatment. She presents to the clinic alone. She notes she is doing fairly well. She denies significant issues from chemo. She denies N&V, diarrhea and has occasional loose BM based on what she eats. She notes at times having bowel cramp before BM. She notes she is tolerating well with occasional fatigue but  manageable. She notes after 1 week her cold sensitivity improves. She denies neuropathy at this time. She request I sign off on her Insurance sending her a BP cuff. I  reviewed her medication list with her.     REVIEW OF SYSTEMS:   Constitutional: Denies fevers, chills or abnormal weight loss (+) Manageable fatigue  Eyes: Denies blurriness of vision Ears, nose, mouth, throat, and face: Denies mucositis or sore throat Respiratory: Denies cough, dyspnea or wheezes Cardiovascular: Denies palpitation, chest discomfort or lower extremity swelling Gastrointestinal:  Denies nausea, heartburn or change in bowel habits Skin: Denies abnormal skin rashes Lymphatics: Denies new lymphadenopathy or easy bruising Neurological:Denies numbness, tingling or new weaknesses Behavioral/Psych: Mood is stable, no new changes  All other systems were reviewed with the patient and are negative.  MEDICAL HISTORY:  Past Medical History:  Diagnosis Date  . Anemia   . Anxiety   . Arthritis   . Breast cancer ([place]) 1992  . Cataract    Bilateral eyes - surgery to remove  . Chronic kidney disease    CKD stage 3  . Complication of anesthesia    "something they use make me itch for a couple of days."  . Depression   . Diabetes mellitus    type 2  . Duodenitis 01/18/2002  . Fainting spell   . Family history of breast cancer   . Family history of prostate cancer   . GERD (gastroesophageal reflux disease)   . Heart murmur    never has caused any problems  . Hiatal hernia 08/08/2008, 01/18/2002  . History of pneumonia    x 2  . Hyperlipidemia   . Hypertension   . Liver cancer ([place]) 08/2018  . Lymphedema 2017   Right arm  . Pneumonia    x 2  . UTI (lower urinary tract infection)     SURGICAL HISTORY: Past Surgical History:  Procedure Laterality Date  . AXILLARY SURGERY     cyst removal, right  . COLONOSCOPY  09/23/2018   Dr. Havery Moros - polyps  . EYE SURGERY Bilateral    cataracts to remove  .  LAPAROSCOPY N/A 11/17/2018   Procedure: LAPAROSCOPY DIAGNOSTIC, INTRAOPERATIVE ULTRASOUND, PERITONEAL BIOPSIES;  Surgeon: [name], MD;  Location: [facility];  Service: General;  Laterality: N/A;  GENERAL AND EPIDURAL  . LIVER BIOPSY  08/2018   Dr. Lindwood Coke  . MASTECTOMY  1992   right, with flap  . NM MYOCAR PERF WALL MOTION  06/11/2009   Protocol:[place], post stress EF58%, EKG negative for ischemia, low risk  . PORTACATH PLACEMENT N/A 12/02/2018   Procedure: INSERTION PORT-A-CATH;  Surgeon: [name], MD;  Location: [facility];  Service: General;  Laterality: N/A;  . RECONSTRUCTION BREAST W/ TRAM FLAP Right   . TONSILLECTOMY    . TRANSTHORACIC ECHOCARDIOGRAM  12/24/2009   LVEF =>55%, normal study  . UPPER GI ENDOSCOPY      I have reviewed the social history and family history with the patient and they are unchanged from previous note.  ALLERGIES:  has No Known Allergies.  MEDICATIONS:  Current Outpatient Medications  Medication Sig Dispense Refill  . amLODipine (NORVASC) 10 MG tablet TAKE 1 TABLET BY MOUTH EVERY DAY (Patient taking differently: Take 10 mg by mouth daily. ) 90 tablet 2  . aspirin 81 MG tablet Take 81 mg by mouth at bedtime.     Marland Kitchen CALCIUM-MAGNESIUM-VITAMIN D PO Take 1 tablet by mouth once a week.     . Cholecalciferol (VITAMIN D3) 2000 UNITS TABS Take 1 tablet by mouth once a week.     . Echinacea 400 MG CAPS Take 400 mg by mouth daily.     Marland Kitchen  glucose blood test strip 1 each by Other route 2 (two) times daily. Use Onetouch verio test strips as instructed to check blood sugar twice daily. 100 each 2  . hydrochlorothiazide (HYDRODIURIL) 25 MG tablet TAKE 1 TABLET DAILY (TO    REPLACE COMBINATION) 90 tablet 0  . HYDROcodone-acetaminophen (NORCO/VICODIN) 5-325 MG tablet Take 1 tablet by mouth every 6 (six) hours as needed for moderate pain. 10 tablet 0  . ketoconazole (NIZORAL) 2 % cream Apply 1 fingertip amount to each foot daily. (Patient taking differently: Apply 1 application  topically daily as needed (affected area of foot). Apply 1 fingertip amount to each foot daily.) 30 g 0  . lansoprazole (PREVACID) 15 MG capsule TAKE 1 CAPSULE DAILY BEFOREBREAKFAST (NEED TO MAKE AN OFFICE VISIT FOR FURTHER   REFILLS) (Patient taking differently: Take 15 mg by mouth daily. ) 90 capsule 1  . lidocaine-prilocaine (EMLA) cream Apply 1 application topically as needed. 30 g 0  . lidocaine-prilocaine (EMLA) cream Apply to affected area once 30 g 3  . loratadine (CLARITIN) 10 MG tablet Take 10 mg by mouth daily as needed for allergies.    . metFORMIN (GLUCOPHAGE-XR) 500 MG 24 hr tablet Take 1 tablet (500 mg total) by mouth 2 (two) times daily before a meal. 180 tablet 1  . metoprolol succinate (TOPROL-XL) 100 MG 24 hr tablet TAKE 1 TABLET (100 MG TOTAL) BY MOUTH DAILY. TAKE WITH OR IMMEDIATELY FOLLOWING A MEAL. 90 tablet 1  . Multiple Vitamins-Minerals (CENTRUM SILVER ULTRA WOMENS) TABS Take 1 tablet by mouth daily.     . Omega 3 1000 MG CAPS Take 2,000 mg by mouth daily.     . ondansetron (ZOFRAN) 8 MG tablet Take 1 tablet (8 mg total) by mouth every 8 (eight) hours as needed for nausea or vomiting. 30 tablet 0  . prochlorperazine (COMPAZINE) 10 MG tablet Take 1 tablet (10 mg total) by mouth every 6 (six) hours as needed for nausea or vomiting. 30 tablet 0  . raloxifene (EVISTA) 60 MG tablet TAKE 1 TABLET DAILY 90 tablet 0  . rosuvastatin (CRESTOR) 20 MG tablet Take 1 tablet (20 mg total) by mouth daily. (Patient taking differently: Take 20 mg by mouth at bedtime. ) 90 tablet 0  . sitaGLIPtin (JANUVIA) 100 MG tablet Take 1 tablet (100 mg total) by mouth daily. 90 tablet 4  . triamcinolone cream (KENALOG) 0.1 % Apply 1 application topically 2 (two) times daily. (Patient taking differently: Apply 1 application topically 2 (two) times daily as needed (rash). ) 30 g 0  . valsartan (DIOVAN) 320 MG tablet TAKE 1 TABLET DAILY (TO    REPLACE COMBINATION) 90 tablet 0   Current  Facility-Administered Medications  Medication Dose Route Frequency Provider Last Rate Last Dose  . triamcinolone acetonide (KENALOG) 10 MG/ML injection 10 mg  10 mg Other Once Harriet Masson, DPM        PHYSICAL EXAMINATION: ECOG PERFORMANCE STATUS: 1 - Symptomatic but completely ambulatory  Vitals:   01/11/19 0923  BP: (!) 152/72  Pulse: 69  Resp: 17  Temp: (!) 97.4 F (36.3 C)  SpO2: 100%   Filed Weights   01/11/19 0923  Weight: 150 lb 4.8 oz (68.2 kg)    GENERAL:alert, no distress and comfortable SKIN: skin color, texture, turgor are normal, no rashes or significant lesions EYES: normal, Conjunctiva are pink and non-injected, sclera clear  NECK: supple, thyroid normal size, non-tender, without nodularity LYMPH:  no palpable lymphadenopathy in the cervical, axillary  LUNGS: clear to auscultation and percussion with normal breathing effort HEART: regular rate & rhythm and no murmurs and no lower extremity edema ABDOMEN:abdomen soft, non-tender and normal bowel sounds Musculoskeletal:no cyanosis of digits and no clubbing  NEURO: alert & oriented x 3 with fluent speech, no focal motor/sensory deficits  LABORATORY DATA:  I have reviewed the data as listed CBC Latest Ref Rng & Units 01/11/2019 12/28/2018 12/14/2018  WBC 4.0 - 10.5 K/uL 5.5 6.8 4.7  Hemoglobin 12.0 - 15.0 g/dL 9.2(L) 9.5(L) 10.7(L)  Hematocrit 36.0 - 46.0 % 28.4(L) 30.1(L) 34.3(L)  Platelets 150 - 400 K/uL 162 164 279     CMP Latest Ref Rng & Units 01/11/2019 12/28/2018 12/14/2018  Glucose 70 - 99 mg/dL 140(H) 132(H) 105(H)  BUN 8 - 23 mg/dL '14 21 18  '$ Creatinine 0.44 - 1.00 mg/dL 1.08(H) 1.38(H) 1.28(H)  Sodium 135 - 145 mmol/L 141 144 143  Potassium 3.5 - 5.1 mmol/L 4.0 4.1 4.2  Chloride 98 - 111 mmol/L 107 109 106  CO2 22 - 32 mmol/L '25 26 25  '$ Calcium 8.9 - 10.3 mg/dL 8.7(L) 8.8(L) 9.4  Total Protein 6.5 - 8.1 g/dL 6.6 6.8 6.9  Total Bilirubin 0.3 - 1.2 mg/dL 0.2(L) <0.2(L) 0.2(L)  Alkaline Phos 38  - 126 U/L 56 65 63  AST 15 - 41 U/L '29 28 26  '$ ALT 0 - 44 U/L '10 14 19      '$ RADIOGRAPHIC STUDIES: I have personally reviewed the radiological images as listed and agreed with the findings in the report. No results found.   ASSESSMENT & PLAN:  Donna May is a 78 y.o. female with   1.Intrahepatic cholangiocarcinoma, cT1N0M1,with peritoneal metastasis, MSS, IDH1 mutation (+) -She presented with mild weight loss, no significant abdominal pain. Biopsy of her liver mass shows adenocarcinoma,most consistent withcholangiocarcinoma -Her EGD/Colonoscopy from 7/10/20was negativeand 09/29/18 PET scanshowedno evidence of distant metastasis. -She was brought to OR on 9/3,unfortunately she was found to have peritoneal metastasis to right diaphragm and surgery was aborted. -Her CT AP from 9/15/20shows dominate liver mass stable to mild increase, mild nodularity of both adrenal glands which warrants being monitored.No visible peritoneal metastasis on CT scan. -Given her metastatic cancer and ineligibility for surgery, I recommend systemic chemotherapy  to control her disease. Due to her CKD, she is not a candidate for cisplatin.  -I started her on first-line Oxaliplatin and Gemcitabine q2weeks on 11/30/18.  -I discussed her FO results which showed MSI stable disease, IDH mutation positive.  I would consider IDH 1 inhibitor in the future.  Not a candidate for immunotherapy -She had port placement by Dr. Annamarie Dawley 12/02/18. -S/p C3 she has been tolerating chemo well with manageable fatigue, fluctuating appetite and cold sensitivity. Will continue to watch for neuropathy. Labs reviewed, CBC and CMP WNL except Hg 9.2, Cr 1.08 (improved). Overall adequate to proceed with C4 Gemcitabine and Oxaliplatin today.  -I discussed continuing this regimen for as long as she can tolerate and as long as her disease is controlled. May hold or D/c Oxaliplatin if neuropathy develops and becomes significant.  -Plan  for CT CAP on 11/6 -f/u with NP Lacie in 2 weeks   2. H/o right breast cancer, Geneticsnegative  -s/p mastectomy in 1992, per patient did not require adjuvant therapy  -followed by Dr. Jana Hakim -Her genetic testing wasnegativefor pathogeneticmutations. She did have VUS of gene APC.  3.Comorbidities:DM, HTN, hiatal hernia, GERD, renal artery stenosis -f/u with PCP -We will monitor her blood pressure and glucose  level closely during her chemo. We discussed that chemotherapy will mayimpact her blood glucose and blood pressure, and we will adjust her meds as needed She is on HCTZ, Amlodipine and Metoprolol. Will continue. Given she is on steroids I encouraged her to watch her BG at home daily and to reduce sugar intake the week of chemo.  -She plans to receive BP Monitor from insurance to monitor BP at home.  -Cr at 1.08 today and BP 152/72 (01/11/19).  -She did not tolerate Glucerna, she will continue low sugar protein shakes.   4. Family support  -she has a son and daughter,they were on the phone during her visit today  5.Goal of care discussion  -Wepreviouslydiscussed the incurable nature of her cancer, and the overall poor prognosis, especially if she does not have good response to chemotherapy or progress on chemo -The patient understands the goal of care is palliative. -she is full code now   PLAN: -Labs reviewed and adequate to proceed with C4 Oxaliplatin and Gemcitabineat same dose -CT CAP W Contrast on 11/6 -Lab, flush, f/u with NP Lacie andGemcitabine and Oxaliplatin in 2 weeks -Lab, flush, f/u and chemo Gemcitabine/Oxaliplatin in 4, 6, and 8 weeks  -I filled out her insurance form to receive BP monitor for home.     No problem-specific Assessment & Plan notes found for this encounter.   No orders of the defined types were placed in this encounter.  All questions were answered. The patient knows to call the clinic with any problems, questions  or concerns. No barriers to learning was detected. I spent 20 minutes counseling the patient face to face. The total time spent in the appointment was 25 minutes and more than 50% was on counseling and review of test results     [name], MD 01/11/2019   I, Joslyn Devon, am acting as scribe for [name], MD.   I have reviewed the above documentation for accuracy and completeness, and I agree with the above.

## 2019-01-05 ENCOUNTER — Ambulatory Visit (INDEPENDENT_AMBULATORY_CARE_PROVIDER_SITE_OTHER): Payer: Medicare Other | Admitting: Endocrinology

## 2019-01-05 ENCOUNTER — Other Ambulatory Visit: Payer: Self-pay

## 2019-01-05 ENCOUNTER — Encounter: Payer: Self-pay | Admitting: Endocrinology

## 2019-01-05 VITALS — BP 156/70 | HR 92 | Ht 65.0 in | Wt 145.8 lb

## 2019-01-05 DIAGNOSIS — E7800 Pure hypercholesterolemia, unspecified: Secondary | ICD-10-CM | POA: Diagnosis not present

## 2019-01-05 DIAGNOSIS — E1140 Type 2 diabetes mellitus with diabetic neuropathy, unspecified: Secondary | ICD-10-CM

## 2019-01-05 LAB — GLUCOSE, POCT (MANUAL RESULT ENTRY): POC Glucose: 91 mg/dl (ref 70–99)

## 2019-01-05 MED ORDER — GLUCOSE BLOOD VI STRP: 1.0000 | ORAL_STRIP | Freq: Two times a day (BID) | 2 refills | Status: DC

## 2019-01-05 NOTE — Progress Notes (Signed)
Patient ID: Donna May, female   DOB: 1940-10-13, 78 y.o.   MRN: [id number]           Reason for Appointment: Consultation for Type 2 Diabetes  Referring PCP: Grier Mitts   History of Present Illness:          Date of diagnosis of type 2 diabetes mellitus:  2004      Background history:   She is not sure how her diabetes was diagnosed or when.  She has been mostly on metformin and Januvia Usually has had good control with A1c reportedly under 7% most of the time In the last couple of years has not had any regular follow-up  Recent history:    she has not been seen in follow-up as directed  Non-insulin hypoglycemic drugs the patient is taking are: Metformin ER 500 mg daily, Januvia 100 mg daily  Her A1c is about the same at 6.4 as of 8/20 compared to 6.2 Fructosamine 223  Current management, blood sugar patterns and problems identified:  She was told to start using the One Touch meter but she did not do so  Also she was asked to let us know when she ran out of Januvia to switch to [place] but she did did not call  Has been on the same diabetes medications for several years without any change  She has not had a glucose meter in quite some time and did not ask for another 1  Although she was supposed to have seen a dietitian after her last visit she did not make an appointment  She is not able to do any physical activity for exercise  Lab glucose today was 91         Side effects from medications have been: None   Typical meal intake: Breakfast is cereal or egg/bacon or oatmeal               Exercise:  Unable to do much  Glucose monitoring:  not done         Glucometer:  Previously Accu-Chek?     Blood Glucose readings not available  Dietician visit, most recent: Several years ago  Weight history:  Wt Readings from Last 3 Encounters:  01/05/19 145 lb 12.8 oz (66.1 kg)  12/28/18 147 lb 8 oz (66.9 kg)  12/15/18 148 lb (67.1 kg)    Glycemic control:   Lab Results  Component Value Date   HGBA1C 6.4 (H) 11/09/2018   HGBA1C 6.2 (A) 09/01/2018   HGBA1C 6.5 (A) 09/06/2017   Lab Results  Component Value Date   MICROALBUR 0.9 09/01/2018   LDLCALC 120 (H) 09/01/2018   CREATININE 1.38 (H) 12/28/2018   Lab Results  Component Value Date   MICRALBCREAT 0.7 09/01/2018    Lab Results  Component Value Date   FRUCTOSAMINE 223 12/30/2018    Office Visit on 01/05/2019  Component Date Value Ref Range Status   POC Glucose 01/05/2019 91  70 - 99 mg/dl Final  Lab on 12/30/2018  Component Date Value Ref Range Status   Fructosamine 12/30/2018 223  0 - 285 umol/L Final   Comment: Published reference interval for apparently healthy subjects between age 24 and 19 is 26 - 285 umol/L and in a poorly controlled diabetic population is 228 - 563 umol/L with a mean of 396 umol/L.     Allergies as of 01/05/2019   No Known Allergies     Medication List       Accurate  as of January 05, 2019  9:15 AM. If you have any questions, ask your nurse or doctor.        amLODipine 10 MG tablet Commonly known as: NORVASC TAKE 1 TABLET BY MOUTH EVERY DAY   aspirin 81 MG tablet Take 81 mg by mouth at bedtime.   CALCIUM-MAGNESIUM-VITAMIN D PO Take 1 tablet by mouth once a week.   Centrum Silver Ultra Womens Tabs Take 1 tablet by mouth daily.   Echinacea 400 MG Caps Take 400 mg by mouth daily.   glucose blood test strip Commonly known as: Accu-Chek Aviva Use as instructed   hydrochlorothiazide 25 MG tablet Commonly known as: HYDRODIURIL TAKE 1 TABLET DAILY (TO    REPLACE COMBINATION)   HYDROcodone-acetaminophen 5-325 MG tablet Commonly known as: NORCO/VICODIN Take 1 tablet by mouth every 6 (six) hours as needed for moderate pain.   HYDROcodone-acetaminophen 5-325 MG tablet Commonly known as: NORCO/VICODIN Take 1 tablet by mouth every 6 (six) hours as needed for moderate pain.   ketoconazole 2 % cream Commonly known as:  NIZORAL Apply 1 fingertip amount to each foot daily. What changed:   how much to take  how to take this  when to take this  reasons to take this   lansoprazole 15 MG capsule Commonly known as: PREVACID TAKE 1 CAPSULE DAILY BEFOREBREAKFAST (NEED TO MAKE AN OFFICE VISIT FOR FURTHER   REFILLS) What changed: See the new instructions.   lidocaine-prilocaine cream Commonly known as: EMLA Apply 1 application topically as needed.   lidocaine-prilocaine cream Commonly known as: EMLA Apply to affected area once   loratadine 10 MG tablet Commonly known as: CLARITIN Take 10 mg by mouth daily as needed for allergies.   metFORMIN 500 MG 24 hr tablet Commonly known as: GLUCOPHAGE-XR Take 1 tablet (500 mg total) by mouth 2 (two) times daily before a meal.   metoprolol succinate 100 MG 24 hr tablet Commonly known as: TOPROL-XL TAKE 1 TABLET (100 MG TOTAL) BY MOUTH DAILY. TAKE WITH OR IMMEDIATELY FOLLOWING A MEAL.   Omega 3 1000 MG Caps Take 2,000 mg by mouth daily.   ondansetron 8 MG tablet Commonly known as: ZOFRAN Take 1 tablet (8 mg total) by mouth every 8 (eight) hours as needed for nausea or vomiting.   prochlorperazine 10 MG tablet Commonly known as: COMPAZINE Take 1 tablet (10 mg total) by mouth every 6 (six) hours as needed for nausea or vomiting.   raloxifene 60 MG tablet Commonly known as: EVISTA TAKE 1 TABLET DAILY   rosuvastatin 20 MG tablet Commonly known as: CRESTOR Take 1 tablet (20 mg total) by mouth daily. What changed: when to take this   sitaGLIPtin 100 MG tablet Commonly known as: Januvia Take 1 tablet (100 mg total) by mouth daily.   triamcinolone cream 0.1 % Commonly known as: KENALOG Apply 1 application topically 2 (two) times daily. What changed:   when to take this  reasons to take this   valsartan 320 MG tablet Commonly known as: DIOVAN TAKE 1 TABLET DAILY (TO    REPLACE COMBINATION)   Vitamin D3 50 MCG (2000 UT) Tabs Take 1 tablet  by mouth once a week.       Allergies: No Known Allergies  Past Medical History:  Diagnosis Date   Anemia    Anxiety    Arthritis    Breast cancer ([place]) 1992   Cataract    Bilateral eyes - surgery to remove   Chronic kidney disease  CKD stage 3   Complication of anesthesia    "something they use make me itch for a couple of days."   Depression    Diabetes mellitus    type 2   Duodenitis 01/18/2002   Fainting spell    Family history of breast cancer    Family history of prostate cancer    GERD (gastroesophageal reflux disease)    Heart murmur    never has caused any problems   Hiatal hernia 08/08/2008, 01/18/2002   History of pneumonia    x 2   Hyperlipidemia    Hypertension    Liver cancer ([place]) 08/2018   Lymphedema 2017   Right arm   Pneumonia    x 2   UTI (lower urinary tract infection)     Past Surgical History:  Procedure Laterality Date   AXILLARY SURGERY     cyst removal, right   COLONOSCOPY  09/23/2018   Dr. Havery Moros - polyps   EYE SURGERY Bilateral    cataracts to remove   LAPAROSCOPY N/A 11/17/2018   Procedure: LAPAROSCOPY DIAGNOSTIC, INTRAOPERATIVE ULTRASOUND, PERITONEAL BIOPSIES;  Surgeon: [name], MD;  Location: [facility];  Service: General;  Laterality: N/A;  GENERAL AND EPIDURAL   LIVER BIOPSY  08/2018   Dr. Lindwood Coke   MASTECTOMY  1992   right, with flap   NM [place]  06/11/2009   Protocol:[place], post stress EF58%, EKG negative for ischemia, low risk   PORTACATH PLACEMENT N/A 12/02/2018   Procedure: INSERTION PORT-A-CATH;  Surgeon: [name], MD;  Location: [facility];  Service: General;  Laterality: N/A;   RECONSTRUCTION BREAST W/ TRAM FLAP Right    TONSILLECTOMY     TRANSTHORACIC ECHOCARDIOGRAM  12/24/2009   LVEF =>55%, normal study   UPPER GI ENDOSCOPY      Family History  Problem Relation Age of Onset   Breast cancer Cousin        diagnosed >50; mother's first cousins   Diabetes  Brother    Heart disease Mother    Hypertension Mother    Heart disease Father    Hypertension Father    Prostate cancer Brother 77   Heart attack Maternal Grandmother    Stroke Maternal Grandfather    Colon cancer Neg Hx    Esophageal cancer Neg Hx    Stomach cancer Neg Hx    Rectal cancer Neg Hx     Social History:  reports that she quit smoking about 30 years ago. Her smoking use included cigarettes. She has a 6.25 pack-year smoking history. She has never used smokeless tobacco. She reports previous alcohol use of about 1.0 standard drinks of alcohol per week. She reports that she does not use drugs.   Review of Systems Oncology: She is followed by oncologist for breast cancer.  Also has intrahepatic cholangiocarcinoma now being managed    Lipid history: Prescribed Crestor by PCP but has not had follow-up labs since starting the medication    Lab Results  Component Value Date   CHOL 190 09/01/2018   HDL 60.50 09/01/2018   LDLCALC 120 (H) 09/01/2018   TRIG 46.0 09/01/2018   CHOLHDL 3 09/01/2018           Hypertension: Has been present for several years managed by PCP  BP Readings from Last 3 Encounters:  01/05/19 (!) 156/70  12/28/18 (!) 141/62  12/15/18 132/60   Recently having worsening of renal function  Lab Results  Component Value Date   CREATININE 1.38 (  H) 12/28/2018   CREATININE 1.28 (H) 12/14/2018   CREATININE 1.09 (H) 12/05/2018    Most recent eye exam was in 2019  Most recent foot exam: 08/2018  Currently known complications of diabetes: Neuropathy    LABS:  Office Visit on 01/05/2019  Component Date Value Ref Range Status   POC Glucose 01/05/2019 91  70 - 99 mg/dl Final  Lab on 12/30/2018  Component Date Value Ref Range Status   Fructosamine 12/30/2018 223  0 - 285 umol/L Final   Comment: Published reference interval for apparently healthy subjects between age 53 and 75 is 77 - 285 umol/L and in a poorly controlled diabetic  population is 228 - 563 umol/L with a mean of 396 umol/L.     Physical Examination:  BP (!) 156/70 (BP Location: Left Arm, Patient Position: Sitting, Cuff Size: Normal)    Pulse 92    Ht 5\' 5"  (1.651 m)    Wt 145 lb 12.8 oz (66.1 kg)    LMP  (LMP Unknown)    SpO2 98%    BMI 24.26 kg/m       ASSESSMENT:  Diabetes type 2  See history of present illness for detailed discussion of current diabetes management, blood sugar patterns and problems identified  A1c is about the same, 6.4  Current treatment regimen is Januvia and metformin ER She was recommended switching Januvia to [place] for cardiovascular risk reduction but she continues to be on Januvia and did not follow-up as directed  Currently however her renal function is worsening and adding Wilder Glade would need adjustment of her diuretics and blood pressure medications also Because of getting treatment for her liver cancer she has had various issues complicating matters also  Hypertension: Followed by PCP  HYPERCHOLESTEROLEMIA: Started on rosuvastatin but has not had a follow-up, needs labs again    PLAN:    1. Glucose monitoring: She does not have a glucose meter She will need to start using the One Touch Verio meter and showed her how to use this  She was advised to check readings either fasting or 2 hours after meals with average testing pregnancy once a day  2.  Diabetes education:  Patient will benefit from consultation with dietitian but she wants to wait to do that  3.  Medication changes at this time  To adjust her medications further if renal function is worse, continue Januvia and Metformin unchanged for now  A1c to be checked on the next visit  There are no Patient Instructions on file for this visit.    Elayne Snare 01/05/2019, 9:15 AM   Note: This office note was prepared with Dragon voice recognition system technology. Any transcriptional errors that result from this process are unintentional.

## 2019-01-05 NOTE — Patient Instructions (Addendum)
Check blood sugars on waking up 2 days a week  Also check blood sugars about 2 hours after meals and do this after different meals by rotation  Recommended blood sugar levels on waking up are 90-130 and about 2 hours after meal is 130-160  Please bring your blood sugar monitor to each visit, thank you  Call insurance about preferred meter  Take Crestor in am  Metformin 2 in am

## 2019-01-11 ENCOUNTER — Encounter: Payer: Self-pay | Admitting: Hematology

## 2019-01-11 ENCOUNTER — Inpatient Hospital Stay (HOSPITAL_BASED_OUTPATIENT_CLINIC_OR_DEPARTMENT_OTHER): Payer: Medicare Other | Admitting: Hematology

## 2019-01-11 ENCOUNTER — Other Ambulatory Visit: Payer: Self-pay

## 2019-01-11 ENCOUNTER — Telehealth: Payer: Self-pay | Admitting: Hematology

## 2019-01-11 ENCOUNTER — Inpatient Hospital Stay: Payer: Medicare Other

## 2019-01-11 ENCOUNTER — Telehealth: Payer: Self-pay

## 2019-01-11 VITALS — BP 152/72 | HR 69 | Temp 97.4°F | Resp 17 | Ht 65.0 in | Wt 150.3 lb

## 2019-01-11 DIAGNOSIS — C50911 Malignant neoplasm of unspecified site of right female breast: Secondary | ICD-10-CM | POA: Diagnosis not present

## 2019-01-11 DIAGNOSIS — C221 Intrahepatic bile duct carcinoma: Secondary | ICD-10-CM | POA: Diagnosis not present

## 2019-01-11 DIAGNOSIS — E1121 Type 2 diabetes mellitus with diabetic nephropathy: Secondary | ICD-10-CM

## 2019-01-11 DIAGNOSIS — Z5111 Encounter for antineoplastic chemotherapy: Secondary | ICD-10-CM | POA: Diagnosis not present

## 2019-01-11 DIAGNOSIS — I10 Essential (primary) hypertension: Secondary | ICD-10-CM

## 2019-01-11 DIAGNOSIS — Z171 Estrogen receptor negative status [ER-]: Secondary | ICD-10-CM

## 2019-01-11 DIAGNOSIS — N1832 Chronic kidney disease, stage 3b: Secondary | ICD-10-CM

## 2019-01-11 DIAGNOSIS — Z95828 Presence of other vascular implants and grafts: Secondary | ICD-10-CM

## 2019-01-11 DIAGNOSIS — I700 Atherosclerosis of aorta: Secondary | ICD-10-CM | POA: Diagnosis not present

## 2019-01-11 DIAGNOSIS — E11649 Type 2 diabetes mellitus with hypoglycemia without coma: Secondary | ICD-10-CM | POA: Diagnosis not present

## 2019-01-11 DIAGNOSIS — C786 Secondary malignant neoplasm of retroperitoneum and peritoneum: Secondary | ICD-10-CM | POA: Diagnosis not present

## 2019-01-11 DIAGNOSIS — N189 Chronic kidney disease, unspecified: Secondary | ICD-10-CM | POA: Diagnosis not present

## 2019-01-11 LAB — CBC WITH DIFFERENTIAL (CANCER CENTER ONLY)
Abs Immature Granulocytes: 0.03 K/uL (ref 0.00–0.07)
Basophils Absolute: 0 K/uL (ref 0.0–0.1)
Basophils Relative: 0 %
Eosinophils Absolute: 0.1 K/uL (ref 0.0–0.5)
Eosinophils Relative: 2 %
HCT: 28.4 % — ABNORMAL LOW (ref 36.0–46.0)
Hemoglobin: 9.2 g/dL — ABNORMAL LOW (ref 12.0–15.0)
Immature Granulocytes: 1 %
Lymphocytes Relative: 26 %
Lymphs Abs: 1.5 K/uL (ref 0.7–4.0)
MCH: 29.6 pg (ref 26.0–34.0)
MCHC: 32.4 g/dL (ref 30.0–36.0)
MCV: 91.3 fL (ref 80.0–100.0)
Monocytes Absolute: 1.3 K/uL — ABNORMAL HIGH (ref 0.1–1.0)
Monocytes Relative: 24 %
Neutro Abs: 2.6 K/uL (ref 1.7–7.7)
Neutrophils Relative %: 47 %
Platelet Count: 162 K/uL (ref 150–400)
RBC: 3.11 MIL/uL — ABNORMAL LOW (ref 3.87–5.11)
RDW: 16.9 % — ABNORMAL HIGH (ref 11.5–15.5)
WBC Count: 5.5 K/uL (ref 4.0–10.5)
nRBC: 0 % (ref 0.0–0.2)

## 2019-01-11 LAB — CMP (CANCER CENTER ONLY)
ALT: 10 U/L (ref 0–44)
AST: 29 U/L (ref 15–41)
Albumin: 3.2 g/dL — ABNORMAL LOW (ref 3.5–5.0)
Alkaline Phosphatase: 56 U/L (ref 38–126)
Anion gap: 9 (ref 5–15)
BUN: 14 mg/dL (ref 8–23)
CO2: 25 mmol/L (ref 22–32)
Calcium: 8.7 mg/dL — ABNORMAL LOW (ref 8.9–10.3)
Chloride: 107 mmol/L (ref 98–111)
Creatinine: 1.08 mg/dL — ABNORMAL HIGH (ref 0.44–1.00)
GFR, Est AFR Am: 57 mL/min — ABNORMAL LOW (ref >60)
GFR, Estimated: 49 mL/min — ABNORMAL LOW (ref >60)
Glucose, Bld: 140 mg/dL — ABNORMAL HIGH (ref 70–99)
Potassium: 4 mmol/L (ref 3.5–5.1)
Sodium: 141 mmol/L (ref 135–145)
Total Bilirubin: 0.2 mg/dL — ABNORMAL LOW (ref 0.3–1.2)
Total Protein: 6.6 g/dL (ref 6.5–8.1)

## 2019-01-11 MED ORDER — HEPARIN SOD (PORK) LOCK FLUSH 100 UNIT/ML IV SOLN
500.0000 [IU] | Freq: Once | INTRAVENOUS | Status: AC | PRN
  Administered 2019-01-11: 14:00:00 500 [IU]
  Filled 2019-01-11: qty 5

## 2019-01-11 MED ORDER — DEXAMETHASONE SODIUM PHOSPHATE 10 MG/ML IJ SOLN
INTRAMUSCULAR | Status: AC
  Filled 2019-01-11: qty 1

## 2019-01-11 MED ORDER — PALONOSETRON HCL INJECTION 0.25 MG/5ML
0.2500 mg | Freq: Once | INTRAVENOUS | Status: AC
  Administered 2019-01-11: 10:00:00 0.25 mg via INTRAVENOUS

## 2019-01-11 MED ORDER — DEXTROSE 5 % IV SOLN
Freq: Once | INTRAVENOUS | Status: AC
  Administered 2019-01-11: 10:00:00 via INTRAVENOUS
  Filled 2019-01-11: qty 250

## 2019-01-11 MED ORDER — SODIUM CHLORIDE 0.9 % IV SOLN
1000.0000 mg/m2 | Freq: Once | INTRAVENOUS | Status: AC
  Administered 2019-01-11: 1748 mg via INTRAVENOUS
  Filled 2019-01-11: qty 45.97

## 2019-01-11 MED ORDER — OXALIPLATIN CHEMO INJECTION 100 MG/20ML
85.0000 mg/m2 | Freq: Once | INTRAVENOUS | Status: AC
  Administered 2019-01-11: 12:00:00 150 mg via INTRAVENOUS
  Filled 2019-01-11: qty 30

## 2019-01-11 MED ORDER — PALONOSETRON HCL INJECTION 0.25 MG/5ML
INTRAVENOUS | Status: AC
  Filled 2019-01-11: qty 5

## 2019-01-11 MED ORDER — DEXAMETHASONE SODIUM PHOSPHATE 10 MG/ML IJ SOLN
10.0000 mg | Freq: Once | INTRAMUSCULAR | Status: AC
  Administered 2019-01-11: 10:00:00 10 mg via INTRAVENOUS

## 2019-01-11 MED ORDER — SODIUM CHLORIDE 0.9% FLUSH
10.0000 mL | INTRAVENOUS | Status: DC | PRN
  Administered 2019-01-11: 14:00:00 10 mL
  Filled 2019-01-11: qty 10

## 2019-01-11 MED ORDER — SODIUM CHLORIDE 0.9% FLUSH
10.0000 mL | Freq: Once | INTRAVENOUS | Status: AC
  Administered 2019-01-11: 09:00:00 10 mL
  Filled 2019-01-11: qty 10

## 2019-01-11 NOTE — Telephone Encounter (Signed)
Scheduled appt per 10/28 los.  Patient will get a print out after treatment from her infusion nurse

## 2019-01-11 NOTE — Telephone Encounter (Signed)
Faxed Blood Pressure monitor form to [employer] to fax [phone], received confirmation fax went through. Sent to HIM for scan to chart.

## 2019-01-11 NOTE — Patient Instructions (Signed)
[facility] Discharge Instructions for Patients Receiving Chemotherapy  Today you received the following chemotherapy agents Gemcitabine (GEMZAR) & Oxaliplatin (PARAPLATIN).  To help prevent nausea and vomiting after your treatment, we encourage you to take your nausea medication as prescribed.   If you develop nausea and vomiting that is not controlled by your nausea medication, call the clinic.   BELOW ARE SYMPTOMS THAT SHOULD BE REPORTED IMMEDIATELY:  *FEVER GREATER THAN 100.5 F  *CHILLS WITH OR WITHOUT FEVER  NAUSEA AND VOMITING THAT IS NOT CONTROLLED WITH YOUR NAUSEA MEDICATION  *UNUSUAL SHORTNESS OF BREATH  *UNUSUAL BRUISING OR BLEEDING  TENDERNESS IN MOUTH AND THROAT WITH OR WITHOUT PRESENCE OF ULCERS  *URINARY PROBLEMS  *BOWEL PROBLEMS  UNUSUAL RASH Items with * indicate a potential emergency and should be followed up as soon as possible.  Feel free to call the clinic should you have any questions or concerns. The clinic phone number is [phone].  Please show the [facility] at check-in to the Emergency Department and triage nurse.

## 2019-01-18 ENCOUNTER — Telehealth: Payer: Self-pay | Admitting: Endocrinology

## 2019-01-18 ENCOUNTER — Other Ambulatory Visit: Payer: Self-pay

## 2019-01-18 MED ORDER — SITAGLIPTIN PHOSPHATE 100 MG PO TABS: 100.0000 mg | ORAL_TABLET | Freq: Every day | ORAL | 4 refills | Status: DC

## 2019-01-18 NOTE — Telephone Encounter (Signed)
Rx sent 

## 2019-01-18 NOTE — Telephone Encounter (Signed)
Donna May [id number]  [facility] mail in  sitaGLIPtin (JANUVIA) 100 MG tablet    [name] called to say she needs a new RX for above medication sent to pharmacy for a 90 day supply.

## 2019-01-20 ENCOUNTER — Other Ambulatory Visit: Payer: Self-pay

## 2019-01-20 ENCOUNTER — Ambulatory Visit (HOSPITAL_COMMUNITY)
Admission: RE | Admit: 2019-01-20 | Discharge: 2019-01-20 | Disposition: A | Discharge status: Home / Self Care | Payer: Medicare Other | Source: Ambulatory Visit | Attending: Nurse Practitioner | Admitting: Nurse Practitioner

## 2019-01-20 DIAGNOSIS — C221 Intrahepatic bile duct carcinoma: Secondary | ICD-10-CM

## 2019-01-20 MED ORDER — SITAGLIPTIN PHOSPHATE 100 MG PO TABS: 100.0000 mg | ORAL_TABLET | Freq: Every day | ORAL | 4 refills | Status: DC

## 2019-01-24 NOTE — Progress Notes (Signed)
Donna May   [phone] Fax:([phone]   Clinic Follow up Note   Patient Care Team: [name], MD as PCP - General (Family Medicine) [name], [name] Dad, MD as Consulting Physician (Oncology) [name], [name], MD as Consulting Physician (Cardiology) [name], MD as Consulting Physician (Obstetrics and Gynecology) [name], [name], NP as Nurse Practitioner (Hematology and Oncology) [facility] [name], MD as Consulting Physician (Hematology) [name], [name], MD as Consulting Physician (Gastroenterology) [name], RN as Oncology Nurse Navigator [name], MD as Consulting Physician (General Surgery) 01/25/2019  CHIEF COMPLAINT: F/u cholangiocarcinoma   SUMMARY OF ONCOLOGIC HISTORY: Oncology History  Malignant neoplasm of female breast ([place])  11/06/2018 Genetic Testing   Negative genetic testing on the [facility] Common Hereditary Cancers panel. A variant of uncertain significance was identified in one of her APC genes, called c.1243G>A (p.Ala415Thr).  The Common Hereditary Cancers Panel offered by [facility] includes sequencing and/or deletion duplication testing of the following 48 genes: APC, ATM, AXIN2, BARD1, BMPR1A, BRCA1, BRCA2, BRIP1, CDH1, CDK4, CDKN2A (p14ARF), CDKN2A (p16INK4a), CHEK2, CTNNA1, DICER1, EPCAM (Deletion/duplication testing only), GREM1 (promoter region deletion/duplication testing only), KIT, MEN1, MLH1, MSH2, MSH3, MSH6, MUTYH, NBN, NF1, NHTL1, PALB2, PDGFRA, PMS2, POLD1, POLE, PTEN, RAD50, RAD51C, RAD51D, RNF43, SDHB, SDHC, SDHD, SMAD4, SMARCA4. STK11, TP53, TSC1, TSC2, and VHL.  The following genes were evaluated for sequence changes only: SDHA and HOXB13 c.251G>A variant only.    11/30/2018 -  Chemotherapy   First line chemo oxaliplatin and gemcitabine q2weeks starting 11/30/18.    Intrahepatic cholangiocarcinoma ([place])  09/07/2018 Imaging   CT Chest IMPRESSION: 1. New,  enhancing mass involving segment 4 of the liver and fundus of gallbladder is concerning for malignancy. This may represent either metastatic disease from breast cancer or neoplasm primary to the liver or hepatic biliary tree. Further evaluation with contrast enhanced CT of the abdomen and pelvis is recommended. 2. No findings to suggest metastatic disease within the chest. 3.  Aortic Atherosclerosis (ICD10-I70.0). 4. Coronary artery calcifications.   09/13/2018 Pathology Results   Diagnosis Liver, needle/core biopsy - ADENOCARCINOMA. Microscopic Comment Immunohistochemistry for CK7 is positive. CK20, TTF1, CDX-2, GATA-3, PAX 8, Qualitative ER, p63 and CK5/6 are negative. The provided clinical history of remote mammary carcinoma is noted. Based on the morphology and immunophenotype of the adenocarcinoma observed in this specimen, primary cholangiocarcinoma is favored. Clinical and radiologic correlation are  encouraged. Results reported to [employer] on 09/15/2018. Intradepartmental consultation (Dr. Vic Ripper).   09/13/2018 Initial Diagnosis   Cholangiocarcinoma ([place])   09/23/2018 Procedure   Colonoscopy by Dr. Havery Moros 09/23/18  IMPRESSION - Two 3 to 4 mm polyps in the ascending colon, removed with a cold snare. Resected and retrieved. - Five 3 to 5 mm polyps in the transverse colon, removed with a cold snare. Resected and retrieved. - One 5 mm polyp at the splenic flexure, removed with a cold snare. Resected and retrieved. - Three 3 to 5 mm polyps in the sigmoid colon, removed with a cold snare. Resected and retrieved. - The examination was otherwise normal. Upper Endopscy by Dr. Havery Moros 09/23/18  IMPRESSION - Esophagogastric landmarks identified. - 2 cm hiatal hernia. - Normal esophagus otherwise. - A single gastric polyp. Resected and retrieved. - Mild gastritis. Biopsied. - Normal duodenal bulb and second portion of the duodenum.   09/23/2018 Pathology Results   Diagnosis  09/23/18 1. Surgical [P], duodenum - BENIGN SMALL BOWEL MUCOSA. - NO ACTIVE INFLAMMATION OR VILLOUS ATROPHY IDENTIFIED. 2. Surgical [  P], stomach, polyp - HYPERPLASTIC POLYP(S). - THERE IS NO EVIDENCE OF MALIGNANCY. 3. Surgical [P], gastric antrum and gastric body - CHRONIC INACTIVE GASTRITIS. - THERE IS NO EVIDENCE OF HELICOBACTER-PYLORI, DYSPLASIA, OR MALIGNANCY. - SEE COMMENT. 4. Surgical [P], colon, sigmoid, splenic flexure, transverse and ascending, polyp (9) - TUBULAR ADENOMA(S). - SESSILE SERRATED POLYP WITHOUT CYTOLOGIC DYSPLASIA. - HIGH GRADE DYSPLASIA IS NOT IDENTIFIED. 5. Surgical [P], colon, sigmoid, polyp (2) - HYPERPLASTIC POLYP(S). - THERE IS NO EVIDENCE OF MALIGNANCY.   09/23/2018 Cancer Staging   Staging form: Intrahepatic Bile Duct, AJCC 8th Edition - Clinical stage from 09/23/2018: Stage IB (cT1b, cN0, cM0) - Signed by [name], MD on 10/06/2018   09/29/2018 PET scan   PET 09/29/18 IMPRESSION: 1. Hypermetabolic mass in the RIGHT hepatic lobe consistent with biopsy proven adenocarcinoma. No additional liver metastasis. 2. No evidence of local breast cancer recurrence in the RIGHT breast or RIGHT axilla. 3. Mild bilateral hypermetabolic adrenal glands is favored benign hyperplasia. 4. No evidence of additional metastatic disease on skull base to thigh FDG PET scan.   11/06/2018 Genetic Testing   Negative genetic testing on the [employer] Common Hereditary Cancers panel. A variant of uncertain significance was identified in one of her APC genes, called c.1243G>A (p.Ala415Thr).  The Common Hereditary Cancers Panel offered by [facility] includes sequencing and/or deletion duplication testing of the following 48 genes: APC, ATM, AXIN2, BARD1, BMPR1A, BRCA1, BRCA2, BRIP1, CDH1, CDK4, CDKN2A (p14ARF), CDKN2A (p16INK4a), CHEK2, CTNNA1, DICER1, EPCAM (Deletion/duplication testing only), GREM1 (promoter region deletion/duplication testing only), KIT, MEN1, MLH1, MSH2, MSH3, MSH6,  MUTYH, NBN, NF1, NHTL1, PALB2, PDGFRA, PMS2, POLD1, POLE, PTEN, RAD50, RAD51C, RAD51D, RNF43, SDHB, SDHC, SDHD, SMAD4, SMARCA4. STK11, TP53, TSC1, TSC2, and VHL.  The following genes were evaluated for sequence changes only: SDHA and HOXB13 c.251G>A variant only.    11/17/2018 Pathology Results   Diagnosis 11/17/18 1. Soft tissue, biopsy, Diaphragmatic nodules - METASTATIC ADENOCARCINOMA, CONSISTENT WITH PATIENT'S CLINICAL HISTORY OF CHOLANGIOCARCINOMA. SEE NOTE 2. Liver, biopsy, Left - LIVER PARENCHYMA WITH A BENIGN FIBROTIC NODULE - NO EVIDENCE OF MALIGNANCY 3. Stomach, biopsy - BENIGN PAPILLARY MESOTHELIAL HYPERPLASIA - NO EVIDENCE OF MALIGNANCY   11/29/2018 Imaging   CT CAP WO Contrast  IMPRESSION: 1. Dominant liver mass appears grossly stable from 09/29/2018. Additional liver lesions are too small to characterize but were not shown to be hypermetabolic on PET. 2. Mild nodularity of both adrenal glands with associated hypermetabolism on 09/29/2018. Continued attention on follow-up exams is warranted. 3. Small right lower lobe nodules, stable from 09/07/2018. Again, attention on follow-up is recommended. 4. Trace bilateral pleural fluid. 5. Aortic atherosclerosis (ICD10-170.0). Coronary artery calcification. 6. Enlarged pulmonic trunk, indicative of pulmonary arterial hypertension.     11/30/2018 -  Chemotherapy   First line chemo oxaliplatin and gemcitabine q2weeks starting 11/30/18.    01/20/2019 Imaging   CT CAP IMPRESSION: restaging  1. Mild interval increase in size of dominant liver mass involving segment 4 and segment 5. 2. No significant or progressive adrenal nodularity identified to suggest metastatic disease. 3. Unchanged appearance of small right lower lobe and lingular lung nodules, nonspecific.     CURRENT THERAPY: First line chemooxaliplatinand gemcitabine q2weeksstarting 11/30/18.  INTERVAL HISTORY: Ms. Harlacher returns for f/u and treatment as scheduled.  She completed cycle 4 on 01/11/19. She feels well. Appetite fluctuates. Has cold sensitivity and cramping in her hands for few days after treatment then resolves. Denies other neuropathy. Denies n/v/c or recent diarrhea. Denies abdominal pain. Denies bleeding, mucositis,  rash, fever, chills, cough, or dyspnea. She has occasional pains in her chest no related to exertion, no chest pain at present. She sees cardiologist tomorrow.    MEDICAL HISTORY:  Past Medical History:  Diagnosis Date   Anemia    Anxiety    Arthritis    Breast cancer ([place]) 1992   Cataract    Bilateral eyes - surgery to remove   Chronic kidney disease    CKD stage 3   Complication of anesthesia    "something they use make me itch for a couple of days."   Depression    Diabetes mellitus    type 2   Duodenitis 01/18/2002   Fainting spell    Family history of breast cancer    Family history of prostate cancer    GERD (gastroesophageal reflux disease)    Heart murmur    never has caused any problems   Hiatal hernia 08/08/2008, 01/18/2002   History of pneumonia    x 2   Hyperlipidemia    Hypertension    Liver cancer ([place]) 08/2018   Lymphedema 2017   Right arm   Pneumonia    x 2   UTI (lower urinary tract infection)     SURGICAL HISTORY: Past Surgical History:  Procedure Laterality Date   AXILLARY SURGERY     cyst removal, right   COLONOSCOPY  09/23/2018   Dr. Havery Moros - polyps   EYE SURGERY Bilateral    cataracts to remove   LAPAROSCOPY N/A 11/17/2018   Procedure: LAPAROSCOPY DIAGNOSTIC, INTRAOPERATIVE ULTRASOUND, PERITONEAL BIOPSIES;  Surgeon: [name], MD;  Location: [facility];  Service: General;  Laterality: N/A;  GENERAL AND EPIDURAL   LIVER BIOPSY  08/2018   Dr. Lindwood Coke   MASTECTOMY  1992   right, with flap   NM [place]  06/11/2009   Protocol:[place], post stress EF58%, EKG negative for ischemia, low risk   PORTACATH PLACEMENT N/A 12/02/2018    Procedure: INSERTION PORT-A-CATH;  Surgeon: [name], MD;  Location: [facility];  Service: General;  Laterality: N/A;   RECONSTRUCTION BREAST W/ TRAM FLAP Right    TONSILLECTOMY     TRANSTHORACIC ECHOCARDIOGRAM  12/24/2009   LVEF =>55%, normal study   UPPER GI ENDOSCOPY      I have reviewed the social history and family history with the patient and they are unchanged from previous note.  ALLERGIES:  has No Known Allergies.  MEDICATIONS:  Current Outpatient Medications  Medication Sig Dispense Refill   amLODipine (NORVASC) 10 MG tablet TAKE 1 TABLET BY MOUTH EVERY DAY (Patient taking differently: Take 10 mg by mouth daily. ) 90 tablet 2   aspirin 81 MG tablet Take 81 mg by mouth at bedtime.      CALCIUM-MAGNESIUM-VITAMIN D PO Take 1 tablet by mouth once a week.      Cholecalciferol (VITAMIN D3) 2000 UNITS TABS Take 1 tablet by mouth once a week.      Echinacea 400 MG CAPS Take 400 mg by mouth daily.      glucose blood test strip 1 each by Other route 2 (two) times daily. Use Onetouch verio test strips as instructed to check blood sugar twice daily. 100 each 2   HYDROcodone-acetaminophen (NORCO/VICODIN) 5-325 MG tablet Take 1 tablet by mouth every 6 (six) hours as needed for moderate pain. 10 tablet 0   ketoconazole (NIZORAL) 2 % cream Apply 1 fingertip amount to each foot daily. (Patient taking differently: Apply 1 application topically daily as  needed (affected area of foot). Apply 1 fingertip amount to each foot daily.) 30 g 0   lansoprazole (PREVACID) 15 MG capsule TAKE 1 CAPSULE DAILY BEFOREBREAKFAST (NEED TO MAKE AN OFFICE VISIT FOR FURTHER   REFILLS) (Patient taking differently: Take 15 mg by mouth daily. ) 90 capsule 1   lidocaine-prilocaine (EMLA) cream Apply 1 application topically as needed. 30 g 0   lidocaine-prilocaine (EMLA) cream Apply to affected area once 30 g 3   loratadine (CLARITIN) 10 MG tablet Take 10 mg by mouth daily as needed for allergies.      metFORMIN (GLUCOPHAGE-XR) 500 MG 24 hr tablet Take 1 tablet (500 mg total) by mouth 2 (two) times daily before a meal. 180 tablet 1   metoprolol succinate (TOPROL-XL) 100 MG 24 hr tablet TAKE 1 TABLET (100 MG TOTAL) BY MOUTH DAILY. TAKE WITH OR IMMEDIATELY FOLLOWING A MEAL. 90 tablet 1   Multiple Vitamins-Minerals (CENTRUM SILVER ULTRA WOMENS) TABS Take 1 tablet by mouth daily.      Omega 3 1000 MG CAPS Take 2,000 mg by mouth daily.      ondansetron (ZOFRAN) 8 MG tablet Take 1 tablet (8 mg total) by mouth every 8 (eight) hours as needed for nausea or vomiting. 30 tablet 0   prochlorperazine (COMPAZINE) 10 MG tablet Take 1 tablet (10 mg total) by mouth every 6 (six) hours as needed for nausea or vomiting. 30 tablet 0   raloxifene (EVISTA) 60 MG tablet TAKE 1 TABLET DAILY 90 tablet 0   rosuvastatin (CRESTOR) 20 MG tablet Take 1 tablet (20 mg total) by mouth daily. (Patient taking differently: Take 20 mg by mouth at bedtime. ) 90 tablet 0   sitaGLIPtin (JANUVIA) 100 MG tablet Take 1 tablet (100 mg total) by mouth daily. 90 tablet 4   triamcinolone cream (KENALOG) 0.1 % Apply 1 application topically 2 (two) times daily. (Patient taking differently: Apply 1 application topically 2 (two) times daily as needed (rash). ) 30 g 0   valsartan (DIOVAN) 320 MG tablet TAKE 1 TABLET DAILY (TO    REPLACE COMBINATION) 90 tablet 0   hydrochlorothiazide (HYDRODIURIL) 25 MG tablet TAKE 1 TABLET DAILY (TO    REPLACE COMBINATION) 90 tablet 0   Current Facility-Administered Medications  Medication Dose Route Frequency Provider Last Rate Last Dose   triamcinolone acetonide (KENALOG) 10 MG/ML injection 10 mg  10 mg Other Once Harriet Masson, DPM       Facility-Administered Medications Ordered in Other Visits  Medication Dose Route Frequency Provider Last Rate Last Dose   heparin lock flush 100 unit/mL  500 Units Intracatheter Once PRN [name], MD       oxaliplatin (ELOXATIN) 150 mg in dextrose 5 % 500 mL  chemo infusion  85 mg/m2 (Treatment Plan Recorded) Intravenous Once [name], MD 265 mL/hr at 01/25/19 1420 150 mg at 01/25/19 1420   sodium chloride flush (NS) 0.9 % injection 10 mL  10 mL Intracatheter PRN [name], MD        PHYSICAL EXAMINATION: ECOG PERFORMANCE STATUS: 0 - Asymptomatic  Vitals:   01/25/19 1127  BP: (!) 174/82  Pulse: 78  Resp: 17  Temp: 98.2 F (36.8 C)  SpO2: 100%   Filed Weights   01/25/19 1127  Weight: 149 lb 6.4 oz (67.8 kg)    GENERAL:alert, no distress and comfortable SKIN: no rash  EYES: sclera clear LUNGS: clear with normal breathing effort HEART: regular rate & rhythm and no murmurs and no lower extremity  edema ABDOMEN:abdomen soft, non-tender and normal bowel sounds Musculoskeletal:no cyanosis of digits and no clubbing  NEURO: alert & oriented x 3 with fluent speech, no focal motor/sensory deficits PAC without erythema   LABORATORY DATA:  I have reviewed the data as listed CBC Latest Ref Rng & Units 01/25/2019 01/11/2019 12/28/2018  WBC 4.0 - 10.5 K/uL 5.4 5.5 6.8  Hemoglobin 12.0 - 15.0 g/dL 8.7(L) 9.2(L) 9.5(L)  Hematocrit 36.0 - 46.0 % 27.3(L) 28.4(L) 30.1(L)  Platelets 150 - 400 K/uL 128(L) 162 164     CMP Latest Ref Rng & Units 01/25/2019 01/11/2019 12/28/2018  Glucose 70 - 99 mg/dL 136(H) 140(H) 132(H)  BUN 8 - 23 mg/dL _0 Creatinine 0.44 - 1.00 mg/dL 1.16(H) 1.08(H) 1.38(H)  Sodium 135 - 145 mmol/L 142 141 144  Potassium 3.5 - 5.1 mmol/L 3.9 4.0 4.1  Chloride 98 - 111 mmol/L 109 107 109  CO2 22 - 32 mmol/L _1 Calcium 8.9 - 10.3 mg/dL 8.6(L) 8.7(L) 8.8(L)  Total Protein 6.5 - 8.1 g/dL 6.7 6.6 6.8  Total Bilirubin 0.3 - 1.2 mg/dL 0.2(L) 0.2(L) <0.2(L)  Alkaline Phos 38 - 126 U/L 60 56 65  AST 15 - 41 U/L 39 29 28  ALT 0 - 44 U/L _2 RADIOGRAPHIC STUDIES: I have personally reviewed the radiological images as listed and agreed with the findings in the report. No results found.   ASSESSMENT &  PLAN: VERCIE POKORNY a 78 y.o.femalewith   1.Intrahepatic cholangiocarcinoma, cT1N0M1,with peritoneal metastasis, MSS, IDH1 mutation (+) -Diagnosed in 09/2018 biopsy of her liver mass shows adenocarcinoma,most consistent withcholangiocarcinoma -09/29/18 PET scanshowedno evidence of distant metastasis. -Unfortunately she was found to have peritoneal metastasis to right diaphragm and surgery was aborted. -HerCT AP from 9/15/20shows dominate liver mass stable to mild increase, mild nodularity of both adrenal glands which warrants being monitored.No visible peritoneal metastasis on CT scan. -Given her metastatic cancer and ineligibility forsurgery, she began first-line systemic chemotherapy for disease control. Due to her CKD, she is not a candidate for cisplatin.  -FO resultswhich showed MSI stable disease, IDH mutation positive.I would consider IDH 1 inhibitor in the future.Not a candidate for immunotherapy -She began oxaliplatin and gemcitabine every 2 weeks on 11/30/2018, s/p 4 cycles.   -Ms. Ryer appears well today. She continues to tolerate treatment very well, without significant toxicities except cold sensitivity lasting a few days.   -we reviewed restaging CT CAP from 11/6 which shows liver mass measures 7.6 x 4.7 x 7.4 with volume of 140 cm^3; volume is slightly larger than on 11/29/18. The images were reviewed in person and with Dr. Burr Medico who feels this is overall stable disease, no new or progressive metastatic disease. Dr. Burr Medico recommends to continue current regimen.  -Labs reviewed, PLT 128K. She knows to watch for bleeding. CMP stable -Proceed with cycle 5 gemox today, no dosage adjustments -She was given a letter to extend her time out of work -She will return for f/u and next cycle in 2 weeks   2. H/o right breast cancer, Geneticsnegative -s/p mastectomy in 1992, per patient did not require adjuvant therapy  -followed by Dr. Jana Hakim -VUS of gene APC; genetics  otherwise normal  3.Comorbidities:DM, HTN, hiatal hernia, GERD, renal artery stenosis -Follow-up with PCP -Cr fluctuates, 1.16 today.  She saw her nephrologist recently. Continue monitoring -I encouraged her to hydrate and avoid nephrotoxic agents.  4. Family support  -she has a son and daughter,they  were on the phone during her visit today  5.Goal of care discussion  -We again reviewed the goal of her treatment is palliative and that he cancer is not curable with chemo alone at this stage. The goal is to control her disease, prolong her life, and give her good quality of life. She understands  -she is full code now  6. Hypoglycemic event -She fell at the store early 12/2018, she attributes to hypoglycemia but did not check sugar -BG 100-140 range lately, down to 88 on CMP on 12/05/18; she does not check frequently at home. I encouraged her to consider checking more often -I recommend to carry food especially during errands away from the house, and eat small frequent meals -she had recent f/u with endo Dr. Dwyane Dee  -on metformin and Celesta Gentile  -Resolved   PLAN: -Labs, CT reviewed -Continue current treatment -Proceed with cycle 5 Gemox today -Work letter given to patient -Return for f/u and next cycle in 2 weeks   All questions were answered. The patient knows to call the clinic with any problems, questions or concerns. No barriers to learning was detected. I spent 20 minutes counseling the patient face to face. The total time spent in the appointment was 25 minutes and more than 50% was on counseling and review of test results     Alla Feeling, NP 01/25/19

## 2019-01-25 ENCOUNTER — Encounter: Payer: Self-pay | Admitting: Nurse Practitioner

## 2019-01-25 ENCOUNTER — Inpatient Hospital Stay: Payer: Medicare Other

## 2019-01-25 ENCOUNTER — Other Ambulatory Visit: Payer: Self-pay

## 2019-01-25 ENCOUNTER — Inpatient Hospital Stay (HOSPITAL_BASED_OUTPATIENT_CLINIC_OR_DEPARTMENT_OTHER): Payer: Medicare Other | Admitting: Nurse Practitioner

## 2019-01-25 ENCOUNTER — Inpatient Hospital Stay: Payer: Medicare Other | Attending: Adult Health

## 2019-01-25 VITALS — BP 174/82 | HR 78 | Temp 98.2°F | Resp 17 | Ht 65.0 in | Wt 149.4 lb

## 2019-01-25 DIAGNOSIS — C786 Secondary malignant neoplasm of retroperitoneum and peritoneum: Secondary | ICD-10-CM | POA: Diagnosis not present

## 2019-01-25 DIAGNOSIS — Z5111 Encounter for antineoplastic chemotherapy: Secondary | ICD-10-CM | POA: Diagnosis not present

## 2019-01-25 DIAGNOSIS — C221 Intrahepatic bile duct carcinoma: Secondary | ICD-10-CM

## 2019-01-25 DIAGNOSIS — Z95828 Presence of other vascular implants and grafts: Secondary | ICD-10-CM

## 2019-01-25 DIAGNOSIS — Z79899 Other long term (current) drug therapy: Secondary | ICD-10-CM | POA: Diagnosis not present

## 2019-01-25 LAB — CBC WITH DIFFERENTIAL (CANCER CENTER ONLY)
Abs Immature Granulocytes: 0.02 10*3/uL (ref 0.00–0.07)
Basophils Absolute: 0 10*3/uL (ref 0.0–0.1)
Basophils Relative: 0 %
Eosinophils Absolute: 0.1 10*3/uL (ref 0.0–0.5)
Eosinophils Relative: 2 %
HCT: 27.3 % — ABNORMAL LOW (ref 36.0–46.0)
Hemoglobin: 8.7 g/dL — ABNORMAL LOW (ref 12.0–15.0)
Immature Granulocytes: 0 %
Lymphocytes Relative: 23 %
Lymphs Abs: 1.2 10*3/uL (ref 0.7–4.0)
MCH: 29.2 pg (ref 26.0–34.0)
MCHC: 31.9 g/dL (ref 30.0–36.0)
MCV: 91.6 fL (ref 80.0–100.0)
Monocytes Absolute: 1.3 10*3/uL — ABNORMAL HIGH (ref 0.1–1.0)
Monocytes Relative: 24 %
Neutro Abs: 2.8 10*3/uL (ref 1.7–7.7)
Neutrophils Relative %: 51 %
Platelet Count: 128 10*3/uL — ABNORMAL LOW (ref 150–400)
RBC: 2.98 MIL/uL — ABNORMAL LOW (ref 3.87–5.11)
RDW: 17.5 % — ABNORMAL HIGH (ref 11.5–15.5)
WBC Count: 5.4 10*3/uL (ref 4.0–10.5)
nRBC: 0 % (ref 0.0–0.2)

## 2019-01-25 LAB — CMP (CANCER CENTER ONLY)
ALT: 15 U/L (ref 0–44)
AST: 39 U/L (ref 15–41)
Albumin: 3.2 g/dL — ABNORMAL LOW (ref 3.5–5.0)
Alkaline Phosphatase: 60 U/L (ref 38–126)
Anion gap: 6 (ref 5–15)
BUN: 15 mg/dL (ref 8–23)
CO2: 27 mmol/L (ref 22–32)
Calcium: 8.6 mg/dL — ABNORMAL LOW (ref 8.9–10.3)
Chloride: 109 mmol/L (ref 98–111)
Creatinine: 1.16 mg/dL — ABNORMAL HIGH (ref 0.44–1.00)
GFR, Est AFR Am: 52 mL/min — ABNORMAL LOW (ref >60)
GFR, Estimated: 45 mL/min — ABNORMAL LOW (ref >60)
Glucose, Bld: 136 mg/dL — ABNORMAL HIGH (ref 70–99)
Potassium: 3.9 mmol/L (ref 3.5–5.1)
Sodium: 142 mmol/L (ref 135–145)
Total Bilirubin: 0.2 mg/dL — ABNORMAL LOW (ref 0.3–1.2)
Total Protein: 6.7 g/dL (ref 6.5–8.1)

## 2019-01-25 MED ORDER — OXALIPLATIN CHEMO INJECTION 100 MG/20ML
85.0000 mg/m2 | Freq: Once | INTRAVENOUS | Status: AC
  Administered 2019-01-25: 150 mg via INTRAVENOUS
  Filled 2019-01-25: qty 20

## 2019-01-25 MED ORDER — HEPARIN SOD (PORK) LOCK FLUSH 100 UNIT/ML IV SOLN
500.0000 [IU] | Freq: Once | INTRAVENOUS | Status: AC | PRN
  Administered 2019-01-25: 500 [IU]
  Filled 2019-01-25: qty 5

## 2019-01-25 MED ORDER — SODIUM CHLORIDE 0.9 % IV SOLN
1000.0000 mg/m2 | Freq: Once | INTRAVENOUS | Status: AC
  Administered 2019-01-25: 1748 mg via INTRAVENOUS
  Filled 2019-01-25: qty 45.97

## 2019-01-25 MED ORDER — DEXTROSE 5 % IV SOLN
Freq: Once | INTRAVENOUS | Status: AC
  Administered 2019-01-25: 13:00:00 via INTRAVENOUS
  Filled 2019-01-25: qty 250

## 2019-01-25 MED ORDER — PALONOSETRON HCL INJECTION 0.25 MG/5ML
INTRAVENOUS | Status: AC
  Filled 2019-01-25: qty 5

## 2019-01-25 MED ORDER — SODIUM CHLORIDE 0.9% FLUSH
10.0000 mL | INTRAVENOUS | Status: DC | PRN
  Administered 2019-01-25: 10 mL
  Filled 2019-01-25: qty 10

## 2019-01-25 MED ORDER — DEXAMETHASONE SODIUM PHOSPHATE 10 MG/ML IJ SOLN
10.0000 mg | Freq: Once | INTRAMUSCULAR | Status: AC
  Administered 2019-01-25: 10 mg via INTRAVENOUS

## 2019-01-25 MED ORDER — DEXAMETHASONE SODIUM PHOSPHATE 10 MG/ML IJ SOLN
INTRAMUSCULAR | Status: AC
  Filled 2019-01-25: qty 1

## 2019-01-25 MED ORDER — SODIUM CHLORIDE 0.9% FLUSH
10.0000 mL | Freq: Once | INTRAVENOUS | Status: AC
  Administered 2019-01-25: 11:00:00 10 mL
  Filled 2019-01-25: qty 10

## 2019-01-25 MED ORDER — PALONOSETRON HCL INJECTION 0.25 MG/5ML
0.2500 mg | Freq: Once | INTRAVENOUS | Status: AC
  Administered 2019-01-25: 0.25 mg via INTRAVENOUS

## 2019-01-25 NOTE — Patient Instructions (Signed)

## 2019-01-25 NOTE — Patient Instructions (Signed)
[facility] Discharge Instructions for Patients Receiving Chemotherapy  Today you received the following chemotherapy agents Gemcitabine (GEMZAR) & Oxaliplatin (PARAPLATIN).  To help prevent nausea and vomiting after your treatment, we encourage you to take your nausea medication as prescribed.   If you develop nausea and vomiting that is not controlled by your nausea medication, call the clinic.   BELOW ARE SYMPTOMS THAT SHOULD BE REPORTED IMMEDIATELY:  *FEVER GREATER THAN 100.5 F  *CHILLS WITH OR WITHOUT FEVER  NAUSEA AND VOMITING THAT IS NOT CONTROLLED WITH YOUR NAUSEA MEDICATION  *UNUSUAL SHORTNESS OF BREATH  *UNUSUAL BRUISING OR BLEEDING  TENDERNESS IN MOUTH AND THROAT WITH OR WITHOUT PRESENCE OF ULCERS  *URINARY PROBLEMS  *BOWEL PROBLEMS  UNUSUAL RASH Items with * indicate a potential emergency and should be followed up as soon as possible.  Feel free to call the clinic should you have any questions or concerns. The clinic phone number is [phone].  Please show the [facility] at check-in to the Emergency Department and triage nurse.

## 2019-01-26 ENCOUNTER — Ambulatory Visit (INDEPENDENT_AMBULATORY_CARE_PROVIDER_SITE_OTHER): Payer: Medicare Other | Admitting: Cardiovascular Disease

## 2019-01-26 ENCOUNTER — Encounter: Payer: Self-pay | Admitting: Cardiovascular Disease

## 2019-01-26 ENCOUNTER — Telehealth: Payer: Self-pay | Admitting: Nurse Practitioner

## 2019-01-26 VITALS — BP 137/81 | HR 82 | Temp 97.9°F | Ht 65.0 in | Wt 151.2 lb

## 2019-01-26 DIAGNOSIS — I2510 Atherosclerotic heart disease of native coronary artery without angina pectoris: Secondary | ICD-10-CM | POA: Diagnosis not present

## 2019-01-26 DIAGNOSIS — I700 Atherosclerosis of aorta: Secondary | ICD-10-CM | POA: Diagnosis not present

## 2019-01-26 DIAGNOSIS — E7800 Pure hypercholesterolemia, unspecified: Secondary | ICD-10-CM

## 2019-01-26 DIAGNOSIS — I10 Essential (primary) hypertension: Secondary | ICD-10-CM

## 2019-01-26 DIAGNOSIS — E119 Type 2 diabetes mellitus without complications: Secondary | ICD-10-CM | POA: Diagnosis not present

## 2019-01-26 DIAGNOSIS — C221 Intrahepatic bile duct carcinoma: Secondary | ICD-10-CM

## 2019-01-26 MED ORDER — ROSUVASTATIN CALCIUM 20 MG PO TABS: 20.0000 mg | ORAL_TABLET | Freq: Every day | ORAL | 1 refills | Status: DC

## 2019-01-26 MED ORDER — METOPROLOL SUCCINATE ER 100 MG PO TB24: 100.0000 mg | ORAL_TABLET | Freq: Every day | ORAL | 1 refills | Status: DC

## 2019-01-26 NOTE — Telephone Encounter (Signed)
No los per 11/11.

## 2019-01-26 NOTE — Progress Notes (Signed)
Patient ID: Donna May, female   DOB: 1940-07-25, 78 y.o.   MRN: [id number]    Cardiology Office Note    Date:  01/28/2019   ID:  Donna, May June 19, 1940, MRN [id number]  PCP:  [name], MD  Cardiologist:   Sanda Klein, MD   Chief Complaint  Patient presents with  . Hypertension    History of Present Illness:  Donna May is a 78 y.o. female with long-standing systemic hypertension, PAD (nonobstructive bilateral renal artery disease), long-standing hyperlipidemia and type 2 diabetes mellitus with diabetic nephropathy.She quit smoking 27 years ago. She has a very strong family history of premature heart disease in both parents and one of her brothers.  She had a low risk nuclear stress test in July 2018.  Her echocardiogram in 2014 showed normal left ventricular systolic function, mild left ventricular hypertrophy, mild pulmonary artery hypertension (37 mmHg).  She had no evidence of stenoses in the lower extremities on arterial duplex study performed in 2019.  Mild plaque without obstruction was seen on ultrasound of the renal arteries in 2016.  Aortic atherosclerosis is incidentally noted on multiple imaging studies performed for cholangiocarcinoma.  She does not have any cardiac complaints.  Specifically she denies chest pain or shortness of breath either at rest or with activity and has not had palpitations, syncope, leg edema or focal neurological events.  She does not have intermittent claudication.   Unfortunately she has been diagnosed with intrahepatic cholangiocarcinoma and is receiving chemotherapy with Gemzar and oxaplatin.  She has a chemotherapy port in the left subclavian area.  She has lost a lot of weight, despite a good appetite.  She does not have abdominal pain, nausea or vomiting. She has chronic lymphedema in her right upper extremity, following lymph node dissection for breast cancer.  Her diabetes is well controlled with her most recent A1c of 6.4%  August 2020.  Her LDL cholesterol was a little high at 120 in June of this year.  She is not currently on statin therapy.   Past Medical History:  Diagnosis Date  . Anemia   . Anxiety   . Arthritis   . Breast cancer ([place]) 1992  . Cataract    Bilateral eyes - surgery to remove  . Chronic kidney disease    CKD stage 3  . Complication of anesthesia    "something they use make me itch for a couple of days."  . Depression   . Diabetes mellitus    type 2  . Duodenitis 01/18/2002  . Fainting spell   . Family history of breast cancer   . Family history of prostate cancer   . GERD (gastroesophageal reflux disease)   . Heart murmur    never has caused any problems  . Hiatal hernia 08/08/2008, 01/18/2002  . History of pneumonia    x 2  . Hyperlipidemia   . Hypertension   . Liver cancer ([place]) 08/2018  . Lymphedema 2017   Right arm  . Pneumonia    x 2  . UTI (lower urinary tract infection)     Past Surgical History:  Procedure Laterality Date  . AXILLARY SURGERY     cyst removal, right  . COLONOSCOPY  09/23/2018   Dr. Havery Moros - polyps  . EYE SURGERY Bilateral    cataracts to remove  . LAPAROSCOPY N/A 11/17/2018   Procedure: LAPAROSCOPY DIAGNOSTIC, INTRAOPERATIVE ULTRASOUND, PERITONEAL BIOPSIES;  Surgeon: [name], MD;  Location: [facility];  Service: General;  Laterality: N/A;  GENERAL AND EPIDURAL  . LIVER BIOPSY  08/2018   Dr. Lindwood Coke  . MASTECTOMY  1992   right, with flap  . NM MYOCAR PERF WALL MOTION  06/11/2009   Protocol:[place], post stress EF58%, EKG negative for ischemia, low risk  . PORTACATH PLACEMENT N/A 12/02/2018   Procedure: INSERTION PORT-A-CATH;  Surgeon: [name], MD;  Location: [facility];  Service: General;  Laterality: N/A;  . RECONSTRUCTION BREAST W/ TRAM FLAP Right   . TONSILLECTOMY    . TRANSTHORACIC ECHOCARDIOGRAM  12/24/2009   LVEF =>55%, normal study  . UPPER GI ENDOSCOPY      Outpatient Medications Prior to Visit  Medication Sig Dispense  Refill  . amLODipine (NORVASC) 10 MG tablet TAKE 1 TABLET BY MOUTH EVERY DAY (Patient taking differently: Take 10 mg by mouth daily. ) 90 tablet 2  . aspirin 81 MG tablet Take 81 mg by mouth at bedtime.     Marland Kitchen CALCIUM-MAGNESIUM-VITAMIN D PO Take 1 tablet by mouth once a week.     . Cholecalciferol (VITAMIN D3) 2000 UNITS TABS Take 1 tablet by mouth once a week.     . Echinacea 400 MG CAPS Take 400 mg by mouth daily.     Marland Kitchen glucose blood test strip 1 each by Other route 2 (two) times daily. Use Onetouch verio test strips as instructed to check blood sugar twice daily. 100 each 2  . hydrochlorothiazide (HYDRODIURIL) 25 MG tablet TAKE 1 TABLET DAILY (TO    REPLACE COMBINATION) 90 tablet 0  . HYDROcodone-acetaminophen (NORCO/VICODIN) 5-325 MG tablet Take 1 tablet by mouth every 6 (six) hours as needed for moderate pain. 10 tablet 0  . ketoconazole (NIZORAL) 2 % cream Apply 1 fingertip amount to each foot daily. (Patient taking differently: Apply 1 application topically daily as needed (affected area of foot). Apply 1 fingertip amount to each foot daily.) 30 g 0  . lansoprazole (PREVACID) 15 MG capsule TAKE 1 CAPSULE DAILY BEFOREBREAKFAST (NEED TO MAKE AN OFFICE VISIT FOR FURTHER   REFILLS) (Patient taking differently: Take 15 mg by mouth daily. ) 90 capsule 1  . lidocaine-prilocaine (EMLA) cream Apply 1 application topically as needed. 30 g 0  . lidocaine-prilocaine (EMLA) cream Apply to affected area once 30 g 3  . loratadine (CLARITIN) 10 MG tablet Take 10 mg by mouth daily as needed for allergies.    . metFORMIN (GLUCOPHAGE-XR) 500 MG 24 hr tablet Take 1 tablet (500 mg total) by mouth 2 (two) times daily before a meal. 180 tablet 1  . Multiple Vitamins-Minerals (CENTRUM SILVER ULTRA WOMENS) TABS Take 1 tablet by mouth daily.     . Omega 3 1000 MG CAPS Take 2,000 mg by mouth daily.     . ondansetron (ZOFRAN) 8 MG tablet Take 1 tablet (8 mg total) by mouth every 8 (eight) hours as needed for nausea or  vomiting. 30 tablet 0  . prochlorperazine (COMPAZINE) 10 MG tablet Take 1 tablet (10 mg total) by mouth every 6 (six) hours as needed for nausea or vomiting. 30 tablet 0  . raloxifene (EVISTA) 60 MG tablet TAKE 1 TABLET DAILY 90 tablet 0  . sitaGLIPtin (JANUVIA) 100 MG tablet Take 1 tablet (100 mg total) by mouth daily. 90 tablet 4  . triamcinolone cream (KENALOG) 0.1 % Apply 1 application topically 2 (two) times daily. (Patient taking differently: Apply 1 application topically 2 (two) times daily as needed (rash). ) 30 g 0  . valsartan (DIOVAN) 320 MG  tablet TAKE 1 TABLET DAILY (TO    REPLACE COMBINATION) 90 tablet 0  . metoprolol succinate (TOPROL-XL) 100 MG 24 hr tablet TAKE 1 TABLET (100 MG TOTAL) BY MOUTH DAILY. TAKE WITH OR IMMEDIATELY FOLLOWING A MEAL. 90 tablet 1  . rosuvastatin (CRESTOR) 20 MG tablet Take 1 tablet (20 mg total) by mouth daily. (Patient taking differently: Take 20 mg by mouth at bedtime. ) 90 tablet 0   Facility-Administered Medications Prior to Visit  Medication Dose Route Frequency Provider Last Rate Last Dose  . triamcinolone acetonide (KENALOG) 10 MG/ML injection 10 mg  10 mg Other Once Harriet Masson, DPM         Allergies:   Patient has no known allergies.   Social History   Socioeconomic History  . Marital status: Single    Spouse name: Not on file  . Number of children: 2  . Years of education: Not on file  . Highest education level: Not on file  Occupational History  . Occupation: retired  Scientific laboratory technician  . Financial resource strain: Not hard at all  . Food insecurity    Worry: Never true    Inability: Never true  . Transportation needs    Medical: No    Non-medical: No  Tobacco Use  . Smoking status: Former Smoker    Packs/day: 0.25    Years: 25.00    Pack years: 6.25    Types: Cigarettes    Quit date: 1990    Years since quitting: 30.8  . Smokeless tobacco: Never Used  Substance and Sexual Activity  . Alcohol use: Not Currently     Alcohol/week: 1.0 standard drinks    Types: 1 Glasses of wine per week    Comment: occasional wine  . Drug use: No  . Sexual activity: Yes    Partners: Male    Birth control/protection: Condom, Post-menopausal    Comment: First sexual encounter age 60. Fewer than 5 partners in life time.  Lifestyle  . Physical activity    Days per week: 0 days    Minutes per session: 0 min  . Stress: Not at all  Relationships  . Social Herbalist on phone: Never    Gets together: Not on file    Attends religious service: More than 4 times per year    Active member of club or organization: No    Attends meetings of clubs or organizations: Never    Relationship status: Not on file  Other Topics Concern  . 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     Family History:  The patient's family history includes Breast cancer in her cousin; Diabetes in her brother; Heart attack in her maternal grandmother; Heart disease in her father and mother; Hypertension in her father and mother; Prostate cancer (age of onset: 1) in her brother; Stroke in her maternal grandfather.   ROS:   Please see the history of present illness.    ROS All other systems reviewed and are negative.   PHYSICAL EXAM:   VS:  BP 137/81   Pulse 82   Temp 97.9 F (36.6 C)   Ht 5\' 5"  (1.651 m)   Wt 151 lb 3.2 oz (68.6  kg)   LMP  (LMP Unknown)   SpO2 97%   BMI 25.16 kg/m     General: Alert, oriented x3, no distress, relatively lean, although mildly overweight by BMI Head: no evidence of trauma, PERRL, EOMI, no exophtalmos or lid lag, no myxedema, no xanthelasma; normal ears, nose and oropharynx Neck: normal jugular venous pulsations and no hepatojugular reflux; brisk carotid pulses without delay  and no carotid bruits Chest: clear to auscultation, no signs of consolidation by percussion or palpation, normal fremitus, symmetrical and full respiratory excursions Cardiovascular: normal position and quality of the apical impulse, regular rhythm, normal first and second heart sounds, no murmurs, rubs or gallops Abdomen: no tenderness or distention, no masses by palpation, no abnormal pulsatility or arterial bruits, normal bowel sounds, no hepatosplenomegaly Extremities: no clubbing, cyanosis or edema; 2+ radial, ulnar and brachial pulses bilaterally; 2+ right femoral, 1+ posterior tibial and dorsalis pedis pulses; 2+ left femoral, 1+ posterior tibial and dorsalis pedis pulses; no subclavian or femoral bruits Neurological: grossly nonfocal Psych: Normal mood and affect   Wt Readings from Last 3 Encounters:  01/26/19 151 lb 3.2 oz (68.6 kg)  01/25/19 149 lb 6.4 oz (67.8 kg)  01/11/19 150 lb 4.8 oz (68.2 kg)      Studies/Labs Reviewed:   EKG:  EKG is not ordered today.  The tracing from 11/09/2018 shows sinus bradycardia but is otherwise a normal tracing.  QTc 403 ms, no repolarization abnormalities.  Recent Labs: 01/25/2019: ALT 15; BUN 15; Creatinine 1.16; Hemoglobin 8.7; Platelet Count 128; Potassium 3.9; Sodium 142   09/01/2018 total cholesterol 190, triglycerides 46, HDL 60, LDL 120, A1c 6.4% 01/04/2019 hemoglobin 9.2, creatinine 1.08, potassium 4.0, normal TSH and ALT  ASSESSMENT:    1. Essential hypertension   2. Diabetes mellitus type 2 in nonobese (HCC)   3. HYPERCHOLESTEROLEMIA   4. Coronary artery calcification seen on CAT scan   5. Aortic atherosclerosis ([place])   6. Cholangiocarcinoma ([place])      PLAN:  In order of problems listed above:  1. HTN: Blood pressure control is acceptable albeit not perfect. 2. DM: Adequate control 3. HLP: Her statin has been stopped and I would recommend staying off it until she completes her chemotherapy in January.  We evaluates the  need for long-term lipid-lowering therapy at this time. 4. Coronary calcification: She does not have angina pectoris and she had a low risk nuclear stress test 2 years ago. 5. Aortic atherosclerosis/PAD: No significant renal artery obstruction, normal ABI. 6. Cholangiocarcinoma: This is not resectable, she is not a candidate for immunotherapy and the prognosis is very guarded.    Medication Adjustments/Labs and Tests Ordered: Current medicines are reviewed at length with the patient today.  Concerns regarding medicines are outlined above.  Medication changes, Labs and Tests ordered today are listed in the Patient Instructions below. Patient Instructions  Medication Instructions:  No changes *If you need a refill on your cardiac medications before your next appointment, please call your pharmacy*  Lab Work: Your provider would like for you to return in 2 months to have the following labs drawn: fasting lab.   If you have labs (blood work) drawn today and your tests are completely normal, you will receive your results only by: Marland Kitchen MyChart Message (if you have MyChart) OR . A paper copy in the mail If you have any lab test that is abnormal or we need to change your treatment, we will call you to review the results.  Testing/Procedures: No  ordered  Follow-Up: At [facility], you and your health needs are our priority.  As part of our continuing mission to provide you with exceptional heart care, we have created designated Provider Care Teams.  These Care Teams include your primary Cardiologist (physician) and Advanced Practice Providers (APPs -  Physician Assistants and Nurse Practitioners) who all work together to provide you with the care you need, when you need it.  Your next appointment:   6 months  The format for your next appointment:   In Person  Provider:   Sanda Klein, MD        Signed, Sanda Klein, MD  01/28/2019 5:19 PM    [address]  [place], [place], [place]  [id number] Phone: [phone]; Fax: [phone]

## 2019-01-26 NOTE — Patient Instructions (Signed)
Medication Instructions:  No changes *If you need a refill on your cardiac medications before your next appointment, please call your pharmacy*  Lab Work: Your provider would like for you to return in 2 months to have the following labs drawn: fasting lab.   If you have labs (blood work) drawn today and your tests are completely normal, you will receive your results only by: Marland Kitchen MyChart Message (if you have MyChart) OR . A paper copy in the mail If you have any lab test that is abnormal or we need to change your treatment, we will call you to review the results.  Testing/Procedures: No ordered  Follow-Up: At [facility], you and your health needs are our priority.  As part of our continuing mission to provide you with exceptional heart care, we have created designated Provider Care Teams.  These Care Teams include your primary Cardiologist (physician) and Advanced Practice Providers (APPs -  Physician Assistants and Nurse Practitioners) who all work together to provide you with the care you need, when you need it.  Your next appointment:   6 months  The format for your next appointment:   In Person  Provider:   Sanda Klein, MD

## 2019-01-28 ENCOUNTER — Encounter: Payer: Self-pay | Admitting: Cardiovascular Disease

## 2019-02-03 DIAGNOSIS — H26493 Other secondary cataract, bilateral: Secondary | ICD-10-CM | POA: Diagnosis not present

## 2019-02-03 DIAGNOSIS — H18413 Arcus senilis, bilateral: Secondary | ICD-10-CM | POA: Diagnosis not present

## 2019-02-03 DIAGNOSIS — H43812 Vitreous degeneration, left eye: Secondary | ICD-10-CM | POA: Diagnosis not present

## 2019-02-03 DIAGNOSIS — H20012 Primary iridocyclitis, left eye: Secondary | ICD-10-CM | POA: Diagnosis not present

## 2019-02-03 DIAGNOSIS — H11153 Pinguecula, bilateral: Secondary | ICD-10-CM | POA: Diagnosis not present

## 2019-02-03 DIAGNOSIS — E119 Type 2 diabetes mellitus without complications: Secondary | ICD-10-CM | POA: Diagnosis not present

## 2019-02-03 DIAGNOSIS — H04123 Dry eye syndrome of bilateral lacrimal glands: Secondary | ICD-10-CM | POA: Diagnosis not present

## 2019-02-06 DIAGNOSIS — H20012 Primary iridocyclitis, left eye: Secondary | ICD-10-CM | POA: Diagnosis not present

## 2019-02-07 ENCOUNTER — Encounter: Payer: Self-pay | Admitting: Nurse Practitioner

## 2019-02-07 ENCOUNTER — Inpatient Hospital Stay (HOSPITAL_BASED_OUTPATIENT_CLINIC_OR_DEPARTMENT_OTHER): Payer: Medicare Other | Admitting: Nurse Practitioner

## 2019-02-07 ENCOUNTER — Inpatient Hospital Stay: Payer: Medicare Other

## 2019-02-07 ENCOUNTER — Other Ambulatory Visit: Payer: Self-pay

## 2019-02-07 VITALS — BP 170/80 | HR 78 | Temp 98.2°F | Resp 17 | Ht 65.0 in | Wt 146.8 lb

## 2019-02-07 DIAGNOSIS — Z95828 Presence of other vascular implants and grafts: Secondary | ICD-10-CM

## 2019-02-07 DIAGNOSIS — C786 Secondary malignant neoplasm of retroperitoneum and peritoneum: Secondary | ICD-10-CM | POA: Diagnosis not present

## 2019-02-07 DIAGNOSIS — C221 Intrahepatic bile duct carcinoma: Secondary | ICD-10-CM

## 2019-02-07 DIAGNOSIS — Z5111 Encounter for antineoplastic chemotherapy: Secondary | ICD-10-CM | POA: Diagnosis not present

## 2019-02-07 DIAGNOSIS — Z79899 Other long term (current) drug therapy: Secondary | ICD-10-CM | POA: Diagnosis not present

## 2019-02-07 LAB — CMP (CANCER CENTER ONLY)
ALT: 19 U/L (ref 0–44)
AST: 47 U/L — ABNORMAL HIGH (ref 15–41)
Albumin: 3.4 g/dL — ABNORMAL LOW (ref 3.5–5.0)
Alkaline Phosphatase: 62 U/L (ref 38–126)
Anion gap: 9 (ref 5–15)
BUN: 13 mg/dL (ref 8–23)
CO2: 26 mmol/L (ref 22–32)
Calcium: 9 mg/dL (ref 8.9–10.3)
Chloride: 109 mmol/L (ref 98–111)
Creatinine: 1.17 mg/dL — ABNORMAL HIGH (ref 0.44–1.00)
GFR, Est AFR Am: 51 mL/min — ABNORMAL LOW (ref >60)
GFR, Estimated: 44 mL/min — ABNORMAL LOW (ref >60)
Glucose, Bld: 96 mg/dL (ref 70–99)
Potassium: 4.1 mmol/L (ref 3.5–5.1)
Sodium: 144 mmol/L (ref 135–145)
Total Bilirubin: 0.3 mg/dL (ref 0.3–1.2)
Total Protein: 6.9 g/dL (ref 6.5–8.1)

## 2019-02-07 LAB — CBC WITH DIFFERENTIAL (CANCER CENTER ONLY)
Abs Immature Granulocytes: 0.02 10*3/uL (ref 0.00–0.07)
Basophils Absolute: 0 10*3/uL (ref 0.0–0.1)
Basophils Relative: 0 %
Eosinophils Absolute: 0.1 10*3/uL (ref 0.0–0.5)
Eosinophils Relative: 1 %
HCT: 27.7 % — ABNORMAL LOW (ref 36.0–46.0)
Hemoglobin: 8.6 g/dL — ABNORMAL LOW (ref 12.0–15.0)
Immature Granulocytes: 1 %
Lymphocytes Relative: 25 %
Lymphs Abs: 1 10*3/uL (ref 0.7–4.0)
MCH: 29.3 pg (ref 26.0–34.0)
MCHC: 31 g/dL (ref 30.0–36.0)
MCV: 94.2 fL (ref 80.0–100.0)
Monocytes Absolute: 1 10*3/uL (ref 0.1–1.0)
Monocytes Relative: 26 %
Neutro Abs: 1.8 10*3/uL (ref 1.7–7.7)
Neutrophils Relative %: 47 %
Platelet Count: 100 10*3/uL — ABNORMAL LOW (ref 150–400)
RBC: 2.94 MIL/uL — ABNORMAL LOW (ref 3.87–5.11)
RDW: 18.6 % — ABNORMAL HIGH (ref 11.5–15.5)
WBC Count: 3.9 10*3/uL — ABNORMAL LOW (ref 4.0–10.5)
nRBC: 0 % (ref 0.0–0.2)

## 2019-02-07 MED ORDER — DEXAMETHASONE SODIUM PHOSPHATE 10 MG/ML IJ SOLN
INTRAMUSCULAR | Status: AC
  Filled 2019-02-07: qty 1

## 2019-02-07 MED ORDER — DEXTROSE 5 % IV SOLN
Freq: Once | INTRAVENOUS | Status: AC
  Administered 2019-02-07: 13:00:00 via INTRAVENOUS
  Filled 2019-02-07: qty 250

## 2019-02-07 MED ORDER — PALONOSETRON HCL INJECTION 0.25 MG/5ML
INTRAVENOUS | Status: AC
  Filled 2019-02-07: qty 5

## 2019-02-07 MED ORDER — DEXAMETHASONE SODIUM PHOSPHATE 10 MG/ML IJ SOLN
10.0000 mg | Freq: Once | INTRAMUSCULAR | Status: AC
  Administered 2019-02-07: 10 mg via INTRAVENOUS

## 2019-02-07 MED ORDER — SODIUM CHLORIDE 0.9% FLUSH
10.0000 mL | Freq: Once | INTRAVENOUS | Status: AC
  Administered 2019-02-07: 10 mL
  Filled 2019-02-07: qty 10

## 2019-02-07 MED ORDER — HEPARIN SOD (PORK) LOCK FLUSH 100 UNIT/ML IV SOLN
500.0000 [IU] | Freq: Once | INTRAVENOUS | Status: AC | PRN
  Administered 2019-02-07: 500 [IU]
  Filled 2019-02-07: qty 5

## 2019-02-07 MED ORDER — OXALIPLATIN CHEMO INJECTION 100 MG/20ML
70.0000 mg/m2 | Freq: Once | INTRAVENOUS | Status: AC
  Administered 2019-02-07: 125 mg via INTRAVENOUS
  Filled 2019-02-07: qty 20

## 2019-02-07 MED ORDER — PALONOSETRON HCL INJECTION 0.25 MG/5ML
0.2500 mg | Freq: Once | INTRAVENOUS | Status: AC
  Administered 2019-02-07: 0.25 mg via INTRAVENOUS

## 2019-02-07 MED ORDER — SODIUM CHLORIDE 0.9% FLUSH
10.0000 mL | INTRAVENOUS | Status: DC | PRN
  Administered 2019-02-07: 10 mL
  Filled 2019-02-07: qty 10

## 2019-02-07 MED ORDER — SODIUM CHLORIDE 0.9 % IV SOLN
1000.0000 mg/m2 | Freq: Once | INTRAVENOUS | Status: AC
  Administered 2019-02-07: 1748 mg via INTRAVENOUS
  Filled 2019-02-07: qty 45.97

## 2019-02-07 NOTE — Progress Notes (Signed)
[facility]   Telephone:([phone] Fax:(336) [phone]   Clinic Follow up Note   Patient Care Team: [name], MD as PCP - General (Family Medicine) [name], [name] Dad, MD as Consulting Physician (Oncology) [name], [name], MD as Consulting Physician (Cardiology) [name], MD as Consulting Physician (Obstetrics and Gynecology) [name], [name], NP as Nurse Practitioner (Hematology and Oncology) [facility] [name], MD as Consulting Physician (Hematology) [name], [name], MD as Consulting Physician (Gastroenterology) [name], RN as Oncology Nurse Navigator [name], MD as Consulting Physician (General Surgery) 02/07/2019  CHIEF COMPLAINT: F/u cholangiocarcinoma   SUMMARY OF ONCOLOGIC HISTORY: Oncology History  Malignant neoplasm of female breast ([place])  11/06/2018 Genetic Testing   Negative genetic testing on the [employer] Common Hereditary Cancers panel. A variant of uncertain significance was identified in one of her APC genes, called c.1243G>A (p.Ala415Thr).  The Common Hereditary Cancers Panel offered by [facility] includes sequencing and/or deletion duplication testing of the following 48 genes: APC, ATM, AXIN2, BARD1, BMPR1A, BRCA1, BRCA2, BRIP1, CDH1, CDK4, CDKN2A (p14ARF), CDKN2A (p16INK4a), CHEK2, CTNNA1, DICER1, EPCAM (Deletion/duplication testing only), GREM1 (promoter region deletion/duplication testing only), KIT, MEN1, MLH1, MSH2, MSH3, MSH6, MUTYH, NBN, NF1, NHTL1, PALB2, PDGFRA, PMS2, POLD1, POLE, PTEN, RAD50, RAD51C, RAD51D, RNF43, SDHB, SDHC, SDHD, SMAD4, SMARCA4. STK11, TP53, TSC1, TSC2, and VHL.  The following genes were evaluated for sequence changes only: SDHA and HOXB13 c.251G>A variant only.    11/30/2018 -  Chemotherapy   First line chemo oxaliplatin and gemcitabine q2weeks starting 11/30/18.    Intrahepatic cholangiocarcinoma ([place])  09/07/2018 Imaging   CT Chest IMPRESSION: 1. New,  enhancing mass involving segment 4 of the liver and fundus of gallbladder is concerning for malignancy. This may represent either metastatic disease from breast cancer or neoplasm primary to the liver or hepatic biliary tree. Further evaluation with contrast enhanced CT of the abdomen and pelvis is recommended. 2. No findings to suggest metastatic disease within the chest. 3.  Aortic Atherosclerosis (ICD10-I70.0). 4. Coronary artery calcifications.   09/13/2018 Pathology Results   Diagnosis Liver, needle/core biopsy - ADENOCARCINOMA. Microscopic Comment Immunohistochemistry for CK7 is positive. CK20, TTF1, CDX-2, GATA-3, PAX 8, Qualitative ER, p63 and CK5/6 are negative. The provided clinical history of remote mammary carcinoma is noted. Based on the morphology and immunophenotype of the adenocarcinoma observed in this specimen, primary cholangiocarcinoma is favored. Clinical and radiologic correlation are  encouraged. Results reported to [employer] on 09/15/2018. Intradepartmental consultation (Dr. Vic Ripper).   09/13/2018 Initial Diagnosis   Cholangiocarcinoma ([place])   09/23/2018 Procedure   Colonoscopy by Dr. Havery Moros 09/23/18  IMPRESSION - Two 3 to 4 mm polyps in the ascending colon, removed with a cold snare. Resected and retrieved. - Five 3 to 5 mm polyps in the transverse colon, removed with a cold snare. Resected and retrieved. - One 5 mm polyp at the splenic flexure, removed with a cold snare. Resected and retrieved. - Three 3 to 5 mm polyps in the sigmoid colon, removed with a cold snare. Resected and retrieved. - The examination was otherwise normal. Upper Endopscy by Dr. Havery Moros 09/23/18  IMPRESSION - Esophagogastric landmarks identified. - 2 cm hiatal hernia. - Normal esophagus otherwise. - A single gastric polyp. Resected and retrieved. - Mild gastritis. Biopsied. - Normal duodenal bulb and second portion of the duodenum.   09/23/2018 Pathology Results   Diagnosis  09/23/18 1. Surgical [P], duodenum - BENIGN SMALL BOWEL MUCOSA. - NO ACTIVE INFLAMMATION OR VILLOUS ATROPHY IDENTIFIED. 2. Surgical [  P], stomach, polyp - HYPERPLASTIC POLYP(S). - THERE IS NO EVIDENCE OF MALIGNANCY. 3. Surgical [P], gastric antrum and gastric body - CHRONIC INACTIVE GASTRITIS. - THERE IS NO EVIDENCE OF HELICOBACTER-PYLORI, DYSPLASIA, OR MALIGNANCY. - SEE COMMENT. 4. Surgical [P], colon, sigmoid, splenic flexure, transverse and ascending, polyp (9) - TUBULAR ADENOMA(S). - SESSILE SERRATED POLYP WITHOUT CYTOLOGIC DYSPLASIA. - HIGH GRADE DYSPLASIA IS NOT IDENTIFIED. 5. Surgical [P], colon, sigmoid, polyp (2) - HYPERPLASTIC POLYP(S). - THERE IS NO EVIDENCE OF MALIGNANCY.   09/23/2018 Cancer Staging   Staging form: Intrahepatic Bile Duct, AJCC 8th Edition - Clinical stage from 09/23/2018: Stage IB (cT1b, cN0, cM0) - Signed by [name], MD on 10/06/2018   09/29/2018 PET scan   PET 09/29/18 IMPRESSION: 1. Hypermetabolic mass in the RIGHT hepatic lobe consistent with biopsy proven adenocarcinoma. No additional liver metastasis. 2. No evidence of local breast cancer recurrence in the RIGHT breast or RIGHT axilla. 3. Mild bilateral hypermetabolic adrenal glands is favored benign hyperplasia. 4. No evidence of additional metastatic disease on skull base to thigh FDG PET scan.   11/06/2018 Genetic Testing   Negative genetic testing on the [employer] Common Hereditary Cancers panel. A variant of uncertain significance was identified in one of her APC genes, called c.1243G>A (p.Ala415Thr).  The Common Hereditary Cancers Panel offered by [facility] includes sequencing and/or deletion duplication testing of the following 48 genes: APC, ATM, AXIN2, BARD1, BMPR1A, BRCA1, BRCA2, BRIP1, CDH1, CDK4, CDKN2A (p14ARF), CDKN2A (p16INK4a), CHEK2, CTNNA1, DICER1, EPCAM (Deletion/duplication testing only), GREM1 (promoter region deletion/duplication testing only), KIT, MEN1, MLH1, MSH2, MSH3, MSH6,  MUTYH, NBN, NF1, NHTL1, PALB2, PDGFRA, PMS2, POLD1, POLE, PTEN, RAD50, RAD51C, RAD51D, RNF43, SDHB, SDHC, SDHD, SMAD4, SMARCA4. STK11, TP53, TSC1, TSC2, and VHL.  The following genes were evaluated for sequence changes only: SDHA and HOXB13 c.251G>A variant only.    11/17/2018 Pathology Results   Diagnosis 11/17/18 1. Soft tissue, biopsy, Diaphragmatic nodules - METASTATIC ADENOCARCINOMA, CONSISTENT WITH PATIENT'S CLINICAL HISTORY OF CHOLANGIOCARCINOMA. SEE NOTE 2. Liver, biopsy, Left - LIVER PARENCHYMA WITH A BENIGN FIBROTIC NODULE - NO EVIDENCE OF MALIGNANCY 3. Stomach, biopsy - BENIGN PAPILLARY MESOTHELIAL HYPERPLASIA - NO EVIDENCE OF MALIGNANCY   11/29/2018 Imaging   CT CAP WO Contrast  IMPRESSION: 1. Dominant liver mass appears grossly stable from 09/29/2018. Additional liver lesions are too small to characterize but were not shown to be hypermetabolic on PET. 2. Mild nodularity of both adrenal glands with associated hypermetabolism on 09/29/2018. Continued attention on follow-up exams is warranted. 3. Small right lower lobe nodules, stable from 09/07/2018. Again, attention on follow-up is recommended. 4. Trace bilateral pleural fluid. 5. Aortic atherosclerosis (ICD10-170.0). Coronary artery calcification. 6. Enlarged pulmonic trunk, indicative of pulmonary arterial hypertension.     11/30/2018 -  Chemotherapy   First line chemo oxaliplatin and gemcitabine q2weeks starting 11/30/18.    01/20/2019 Imaging   CT CAP IMPRESSION: restaging  1. Mild interval increase in size of dominant liver mass involving segment 4 and segment 5. 2. No significant or progressive adrenal nodularity identified to suggest metastatic disease. 3. Unchanged appearance of small right lower lobe and lingular lung nodules, nonspecific.     CURRENT THERAPY: First line chemooxaliplatinand gemcitabine q2weeksstarting 11/30/18.  INTERVAL HISTORY: Donna May returns for f/u and treatment as scheduled.  She completed cycle 5 Gemox on 01/25/19. She feels well. Appetite/weight and energy fluctuate. Some days she is very active. Her birthday was last week and she ate very well. She eats better when she shares a meal with someone.  Denies mucositis. She has mild intermittent nausea, doesn't always require medication; no vomiting. Has diarrhea once per week. She has mild abdominal discomfort occasionally before BM then resolves. Cold sensitivity with touch lasts 2 weeks, not that long with oral sensitivity. No other neuropathy. Denies fever, chills, cough, chest pain, dyspnea, leg swelling, or rash    MEDICAL HISTORY:  Past Medical History:  Diagnosis Date  . Anemia   . Anxiety   . Arthritis   . Breast cancer ([place]) 1992  . Cataract    Bilateral eyes - surgery to remove  . Chronic kidney disease    CKD stage 3  . Complication of anesthesia    "something they use make me itch for a couple of days."  . Depression   . Diabetes mellitus    type 2  . Duodenitis 01/18/2002  . Fainting spell   . Family history of breast cancer   . Family history of prostate cancer   . GERD (gastroesophageal reflux disease)   . Heart murmur    never has caused any problems  . Hiatal hernia 08/08/2008, 01/18/2002  . History of pneumonia    x 2  . Hyperlipidemia   . Hypertension   . Liver cancer ([place]) 08/2018  . Lymphedema 2017   Right arm  . Pneumonia    x 2  . UTI (lower urinary tract infection)     SURGICAL HISTORY: Past Surgical History:  Procedure Laterality Date  . AXILLARY SURGERY     cyst removal, right  . COLONOSCOPY  09/23/2018   Dr. Havery Moros - polyps  . EYE SURGERY Bilateral    cataracts to remove  . LAPAROSCOPY N/A 11/17/2018   Procedure: LAPAROSCOPY DIAGNOSTIC, INTRAOPERATIVE ULTRASOUND, PERITONEAL BIOPSIES;  Surgeon: [name], MD;  Location: [facility];  Service: General;  Laterality: N/A;  GENERAL AND EPIDURAL  . LIVER BIOPSY  08/2018   Dr. Lindwood Coke  . MASTECTOMY  1992   right, with  flap  . NM MYOCAR PERF WALL MOTION  06/11/2009   Protocol:[place], post stress EF58%, EKG negative for ischemia, low risk  . PORTACATH PLACEMENT N/A 12/02/2018   Procedure: INSERTION PORT-A-CATH;  Surgeon: [name], MD;  Location: [facility];  Service: General;  Laterality: N/A;  . RECONSTRUCTION BREAST W/ TRAM FLAP Right   . TONSILLECTOMY    . TRANSTHORACIC ECHOCARDIOGRAM  12/24/2009   LVEF =>55%, normal study  . UPPER GI ENDOSCOPY      I have reviewed the social history and family history with the patient and they are unchanged from previous note.  ALLERGIES:  has No Known Allergies.  MEDICATIONS:  Current Outpatient Medications  Medication Sig Dispense Refill  . amLODipine (NORVASC) 10 MG tablet TAKE 1 TABLET BY MOUTH EVERY DAY (Patient taking differently: Take 10 mg by mouth daily. ) 90 tablet 2  . aspirin 81 MG tablet Take 81 mg by mouth at bedtime.     Marland Kitchen CALCIUM-MAGNESIUM-VITAMIN D PO Take 1 tablet by mouth once a week.     . Cholecalciferol (VITAMIN D3) 2000 UNITS TABS Take 1 tablet by mouth once a week.     . Echinacea 400 MG CAPS Take 400 mg by mouth daily.     Marland Kitchen glucose blood test strip 1 each by Other route 2 (two) times daily. Use Onetouch verio test strips as instructed to check blood sugar twice daily. 100 each 2  . hydrochlorothiazide (HYDRODIURIL) 25 MG tablet TAKE 1 TABLET DAILY (TO    REPLACE COMBINATION) 90 tablet  0  . HYDROcodone-acetaminophen (NORCO/VICODIN) 5-325 MG tablet Take 1 tablet by mouth every 6 (six) hours as needed for moderate pain. 10 tablet 0  . ketoconazole (NIZORAL) 2 % cream Apply 1 fingertip amount to each foot daily. (Patient taking differently: Apply 1 application topically daily as needed (affected area of foot). Apply 1 fingertip amount to each foot daily.) 30 g 0  . lansoprazole (PREVACID) 15 MG capsule TAKE 1 CAPSULE DAILY BEFOREBREAKFAST (NEED TO MAKE AN OFFICE VISIT FOR FURTHER   REFILLS) (Patient taking differently: Take 15 mg by mouth daily. )  90 capsule 1  . lidocaine-prilocaine (EMLA) cream Apply 1 application topically as needed. 30 g 0  . lidocaine-prilocaine (EMLA) cream Apply to affected area once 30 g 3  . loratadine (CLARITIN) 10 MG tablet Take 10 mg by mouth daily as needed for allergies.    . metFORMIN (GLUCOPHAGE-XR) 500 MG 24 hr tablet Take 1 tablet (500 mg total) by mouth 2 (two) times daily before a meal. 180 tablet 1  . metoprolol succinate (TOPROL-XL) 100 MG 24 hr tablet Take 1 tablet (100 mg total) by mouth daily. Take with or immediately following a meal. 90 tablet 1  . Multiple Vitamins-Minerals (CENTRUM SILVER ULTRA WOMENS) TABS Take 1 tablet by mouth daily.     . Omega 3 1000 MG CAPS Take 2,000 mg by mouth daily.     . ondansetron (ZOFRAN) 8 MG tablet Take 1 tablet (8 mg total) by mouth every 8 (eight) hours as needed for nausea or vomiting. 30 tablet 0  . prochlorperazine (COMPAZINE) 10 MG tablet Take 1 tablet (10 mg total) by mouth every 6 (six) hours as needed for nausea or vomiting. 30 tablet 0  . raloxifene (EVISTA) 60 MG tablet TAKE 1 TABLET DAILY 90 tablet 0  . rosuvastatin (CRESTOR) 20 MG tablet Take 1 tablet (20 mg total) by mouth daily. 90 tablet 1  . sitaGLIPtin (JANUVIA) 100 MG tablet Take 1 tablet (100 mg total) by mouth daily. 90 tablet 4  . triamcinolone cream (KENALOG) 0.1 % Apply 1 application topically 2 (two) times daily. (Patient taking differently: Apply 1 application topically 2 (two) times daily as needed (rash). ) 30 g 0  . valsartan (DIOVAN) 320 MG tablet TAKE 1 TABLET DAILY (TO    REPLACE COMBINATION) 90 tablet 0   Current Facility-Administered Medications  Medication Dose Route Frequency Provider Last Rate Last Dose  . triamcinolone acetonide (KENALOG) 10 MG/ML injection 10 mg  10 mg Other Once Harriet Masson, DPM       Facility-Administered Medications Ordered in Other Visits  Medication Dose Route Frequency Provider Last Rate Last Dose  . gemcitabine (GEMZAR) 1,748 mg in sodium  chloride 0.9 % 250 mL chemo infusion  1,000 mg/m2 (Treatment Plan Recorded) Intravenous Once [name], MD      . heparin lock flush 100 unit/mL  500 Units Intracatheter Once PRN [name], MD      . oxaliplatin (ELOXATIN) 125 mg in dextrose 5 % 500 mL chemo infusion  70 mg/m2 (Treatment Plan Recorded) Intravenous Once [name], MD      . sodium chloride flush (NS) 0.9 % injection 10 mL  10 mL Intracatheter PRN [name], MD        PHYSICAL EXAMINATION: ECOG PERFORMANCE STATUS: 1 - Symptomatic but completely ambulatory  Vitals:   02/07/19 1124  BP: (!) 170/80  Pulse: 78  Resp: 17  Temp: 98.2 F (36.8 C)  SpO2: 98%   Filed Weights  02/07/19 1124  Weight: 146 lb 12.8 oz (66.6 kg)    GENERAL:alert, no distress and comfortable SKIN: no rash  EYES: sclera clear NECK: slightly more prominent left supraclavicular tissue, no palpable mass. Patient states this is her baseline LYMPH:  no palpable cervical lymphadenopathy LUNGS: clear with normal breathing effort HEART: regular rate & rhythm, no lower extremity edema ABDOMEN: abdomen soft, non-tender and normal bowel sounds. No RUQ tenderness or hepatomegaly  NEURO: alert & oriented x 3 with fluent speech, no focal motor or sensory deficits per tuning fork exam PAC without erythema   LABORATORY DATA:  I have reviewed the data as listed CBC Latest Ref Rng & Units 02/07/2019 01/25/2019 01/11/2019  WBC 4.0 - 10.5 K/uL 3.9(L) 5.4 5.5  Hemoglobin 12.0 - 15.0 g/dL 8.6(L) 8.7(L) 9.2(L)  Hematocrit 36.0 - 46.0 % 27.7(L) 27.3(L) 28.4(L)  Platelets 150 - 400 K/uL 100(L) 128(L) 162     CMP Latest Ref Rng & Units 02/07/2019 01/25/2019 01/11/2019  Glucose 70 - 99 mg/dL 96 136(H) 140(H)  BUN 8 - 23 mg/dL _0 Creatinine 0.44 - 1.00 mg/dL 1.17(H) 1.16(H) 1.08(H)  Sodium 135 - 145 mmol/L 144 142 141  Potassium 3.5 - 5.1 mmol/L 4.1 3.9 4.0  Chloride 98 - 111 mmol/L 109 109 107  CO2 22 - 32 mmol/L _1 Calcium 8.9 - 10.3 mg/dL 9.0  8.6(L) 8.7(L)  Total Protein 6.5 - 8.1 g/dL 6.9 6.7 6.6  Total Bilirubin 0.3 - 1.2 mg/dL 0.3 0.2(L) 0.2(L)  Alkaline Phos 38 - 126 U/L 62 60 56  AST 15 - 41 U/L 47(H) 39 29  ALT 0 - 44 U/L _2 RADIOGRAPHIC STUDIES: I have personally reviewed the radiological images as listed and agreed with the findings in the report. No results found.   ASSESSMENT & PLAN: Donna May a 78 y.o.femalewith   1.Intrahepatic cholangiocarcinoma, cT1N0M1,with peritoneal metastasis, MSS, IDH1 mutation (+) -Diagnosed in 09/2018 biopsy of her liver mass shows adenocarcinoma,most consistent withcholangiocarcinoma -09/29/18 PET scanshowedno evidence of distant metastasis. -Unfortunately she was found to have peritoneal metastasis to right diaphragm and surgery was aborted. -HerCT AP from 9/15/20shows dominate liver mass stable to mild increase, mild nodularity of both adrenal glands which warrants being monitored.No visible peritoneal metastasis on CT scan. -Given her metastatic cancer and ineligibility forsurgery,she began first-line systemic chemotherapy for disease control.Due to her CKD, she is not a candidate for cisplatin.  -FO resultsshowed MSI stable disease, IDH mutation positive.Consider IDH 1 inhibitor in the future.Not a candidate for immunotherapy -She began oxaliplatin and gemcitabine every 2 weeks on 11/30/2018, s/p 5 cycles. -I previously reviewed restaging CT from 01/20/19 which showed overall stable disease. She continues first line treatment -Donna May appears stable today. She continues to tolerate treatment well overall. She has worsening cold sensitivity with touch, lasting 2 weeks, and progressive thrombocytopenia. Tuning fork exam is normal. Otherwise she tolerates treatment well.  -CBC with PLT 100K, Hgb 8.6 which is stable. CMP shows Cr. 1.17 also stable.  -I recommend for her to proceed with cycle 6 Gemox today with mild oxaliplatin dose reduction for  thrombocytopenia. I explained the rationale, and patient agrees.  -I suggest she try oral B complex vitamin for neuropathy. She agrees. Will monitor closely.  -F/u in 2 weeks with cycle 7  2. H/o right breast cancer, Geneticsnegative -s/p mastectomy in 1992, per patient did not require adjuvant therapy  -followed by Dr. Jana Hakim -VUS of gene  APC;genetics otherwise normal  3.Comorbidities:DM, HTN, hiatal hernia, GERD, renal artery stenosis -Follow-up with PCP -Crfluctuates, 1.17 today; overall much improved from July - Sept this year. -She saw her nephrologist recently. Continue monitoring -I encouraged her to hydrate and avoid nephrotoxic agents.  4. Family support  -she has a son and daughter who are supportive. Gets together with her daughter occasionally which is good for her  5.Goal of care discussion  -We again reviewed the goal of her treatment is palliative and that he cancer is not curable with chemo alone at this stage. The goal is to control her disease, prolong her life, and give her good quality of life. She understands  -she is full code now  6. Hypoglycemic event -She fell at the store early 12/2018, she attributes to hypoglycemia but did not check sugar -BG 100-140 range lately, down to 88 on CMP on 12/05/18; she does not check frequently at home. I encouraged her to consider checking more often -I recommend to carry food especially during errands away from the house, and eat small frequent meals -she had recent f/u with endo Dr. Dwyane Dee  -on metformin and januvia -Resolved   PLAN: -Labs reviewed -Due to worsening thrombocytopenia, will dose reduce Oxaliplatin today; I reviewed with Dr. Burr Medico  -Proceed with cycle 6 Gemox -Begin oral B complex vitamin for neuropathy  -F/u and cycle 7 in 2 weeks   All questions were answered. The patient knows to call the clinic with any problems, questions or concerns. No barriers to learning was detected.      Alla Feeling, NP 02/07/19

## 2019-02-07 NOTE — Patient Instructions (Signed)
[facility] Discharge Instructions for Patients Receiving Chemotherapy  Today you received the following chemotherapy agents Gemcitabine (GEMZAR) & Oxaliplatin (PARAPLATIN).  To help prevent nausea and vomiting after your treatment, we encourage you to take your nausea medication as prescribed.   If you develop nausea and vomiting that is not controlled by your nausea medication, call the clinic.   BELOW ARE SYMPTOMS THAT SHOULD BE REPORTED IMMEDIATELY:  *FEVER GREATER THAN 100.5 F  *CHILLS WITH OR WITHOUT FEVER  NAUSEA AND VOMITING THAT IS NOT CONTROLLED WITH YOUR NAUSEA MEDICATION  *UNUSUAL SHORTNESS OF BREATH  *UNUSUAL BRUISING OR BLEEDING  TENDERNESS IN MOUTH AND THROAT WITH OR WITHOUT PRESENCE OF ULCERS  *URINARY PROBLEMS  *BOWEL PROBLEMS  UNUSUAL RASH Items with * indicate a potential emergency and should be followed up as soon as possible.  Feel free to call the clinic should you have any questions or concerns. The clinic phone number is [phone].  Please show the [facility] at check-in to the Emergency Department and triage nurse.

## 2019-02-08 ENCOUNTER — Telehealth: Payer: Self-pay | Admitting: Nurse Practitioner

## 2019-02-08 NOTE — Telephone Encounter (Signed)
Scheduled appt per 11/24 los.

## 2019-02-17 ENCOUNTER — Telehealth: Payer: Self-pay | Admitting: *Deleted

## 2019-02-20 DIAGNOSIS — H20012 Primary iridocyclitis, left eye: Secondary | ICD-10-CM | POA: Diagnosis not present

## 2019-02-20 NOTE — Progress Notes (Signed)
[place]   [phone] [phone]   Clinic Follow up Note   Patient Care Team: [name], MD as PCP - General (Family Medicine) [name], [name] Dad, MD as Consulting Physician (Oncology) [name], [name], MD as Consulting Physician (Cardiology) [name], MD as Consulting Physician (Obstetrics and Gynecology) [name], [name], NP as Nurse Practitioner (Hematology and Oncology) [facility] [name], MD as Consulting Physician (Hematology) [name], [name], MD as Consulting Physician (Gastroenterology) Virgina Evener, Dawn, RN (Inactive) as Oncology Nurse Navigator [name], MD as Consulting Physician (General Surgery)  Date of Service:  02/22/2019  CHIEF COMPLAINT: F/u ofcholangiocarcinomaof liver  SUMMARY OF ONCOLOGIC HISTORY: Oncology History  Malignant neoplasm of female breast ([place])  11/06/2018 Genetic Testing   Negative genetic testing on the [facility] Common Hereditary Cancers panel. A variant of uncertain significance was identified in one of her APC genes, called c.1243G>A (p.Ala415Thr).  The Common Hereditary Cancers Panel offered by [facility] includes sequencing and/or deletion duplication testing of the following 48 genes: APC, ATM, AXIN2, BARD1, BMPR1A, BRCA1, BRCA2, BRIP1, CDH1, CDK4, CDKN2A (p14ARF), CDKN2A (p16INK4a), CHEK2, CTNNA1, DICER1, EPCAM (Deletion/duplication testing only), GREM1 (promoter region deletion/duplication testing only), KIT, MEN1, MLH1, MSH2, MSH3, MSH6, MUTYH, NBN, NF1, NHTL1, PALB2, PDGFRA, PMS2, POLD1, POLE, PTEN, RAD50, RAD51C, RAD51D, RNF43, SDHB, SDHC, SDHD, SMAD4, SMARCA4. STK11, TP53, TSC1, TSC2, and VHL.  The following genes were evaluated for sequence changes only: SDHA and HOXB13 c.251G>A variant only.    11/30/2018 -  Chemotherapy   First line chemo oxaliplatin and gemcitabine q2weeks starting 11/30/18.    Intrahepatic cholangiocarcinoma ([place])  09/07/2018 Imaging     CT Chest IMPRESSION: 1. New, enhancing mass involving segment 4 of the liver and fundus of gallbladder is concerning for malignancy. This may represent either metastatic disease from breast cancer or neoplasm primary to the liver or hepatic biliary tree. Further evaluation with contrast enhanced CT of the abdomen and pelvis is recommended. 2. No findings to suggest metastatic disease within the chest. 3.  Aortic Atherosclerosis (ICD10-I70.0). 4. Coronary artery calcifications.   09/13/2018 Pathology Results   Diagnosis Liver, needle/core biopsy - ADENOCARCINOMA. Microscopic Comment Immunohistochemistry for CK7 is positive. CK20, TTF1, CDX-2, GATA-3, PAX 8, Qualitative ER, p63 and CK5/6 are negative. The provided clinical history of remote mammary carcinoma is noted. Based on the morphology and immunophenotype of the adenocarcinoma observed in this specimen, primary cholangiocarcinoma is favored. Clinical and radiologic correlation are  encouraged. Results reported to [employer] on 09/15/2018. Intradepartmental consultation (Dr. Vic Ripper).   09/13/2018 Initial Diagnosis   Cholangiocarcinoma ([place])   09/23/2018 Procedure   Colonoscopy by Dr. Havery Moros 09/23/18  IMPRESSION - Two 3 to 4 mm polyps in the ascending colon, removed with a cold snare. Resected and retrieved. - Five 3 to 5 mm polyps in the transverse colon, removed with a cold snare. Resected and retrieved. - One 5 mm polyp at the splenic flexure, removed with a cold snare. Resected and retrieved. - Three 3 to 5 mm polyps in the sigmoid colon, removed with a cold snare. Resected and retrieved. - The examination was otherwise normal. Upper Endopscy by Dr. Havery Moros 09/23/18  IMPRESSION - Esophagogastric landmarks identified. - 2 cm hiatal hernia. - Normal esophagus otherwise. - A single gastric polyp. Resected and retrieved. - Mild gastritis. Biopsied. - Normal duodenal bulb and second portion of the duodenum.   09/23/2018  Pathology Results   Diagnosis 09/23/18 1. Surgical [P], duodenum - BENIGN SMALL BOWEL MUCOSA. - NO ACTIVE  INFLAMMATION OR VILLOUS ATROPHY IDENTIFIED. 2. Surgical [P], stomach, polyp - HYPERPLASTIC POLYP(S). - THERE IS NO EVIDENCE OF MALIGNANCY. 3. Surgical [P], gastric antrum and gastric body - CHRONIC INACTIVE GASTRITIS. - THERE IS NO EVIDENCE OF HELICOBACTER-PYLORI, DYSPLASIA, OR MALIGNANCY. - SEE COMMENT. 4. Surgical [P], colon, sigmoid, splenic flexure, transverse and ascending, polyp (9) - TUBULAR ADENOMA(S). - SESSILE SERRATED POLYP WITHOUT CYTOLOGIC DYSPLASIA. - HIGH GRADE DYSPLASIA IS NOT IDENTIFIED. 5. Surgical [P], colon, sigmoid, polyp (2) - HYPERPLASTIC POLYP(S). - THERE IS NO EVIDENCE OF MALIGNANCY.   09/23/2018 Cancer Staging   Staging form: Intrahepatic Bile Duct, AJCC 8th Edition - Clinical stage from 09/23/2018: Stage IB (cT1b, cN0, cM0) - Signed by [name], MD on 10/06/2018   09/29/2018 PET scan   PET 09/29/18 IMPRESSION: 1. Hypermetabolic mass in the RIGHT hepatic lobe consistent with biopsy proven adenocarcinoma. No additional liver metastasis. 2. No evidence of local breast cancer recurrence in the RIGHT breast or RIGHT axilla. 3. Mild bilateral hypermetabolic adrenal glands is favored benign hyperplasia. 4. No evidence of additional metastatic disease on skull base to thigh FDG PET scan.   11/06/2018 Genetic Testing   Negative genetic testing on the [facility] Common Hereditary Cancers panel. A variant of uncertain significance was identified in one of her APC genes, called c.1243G>A (p.Ala415Thr).  The Common Hereditary Cancers Panel offered by [facility] includes sequencing and/or deletion duplication testing of the following 48 genes: APC, ATM, AXIN2, BARD1, BMPR1A, BRCA1, BRCA2, BRIP1, CDH1, CDK4, CDKN2A (p14ARF), CDKN2A (p16INK4a), CHEK2, CTNNA1, DICER1, EPCAM (Deletion/duplication testing only), GREM1 (promoter region deletion/duplication testing only), KIT,  MEN1, MLH1, MSH2, MSH3, MSH6, MUTYH, NBN, NF1, NHTL1, PALB2, PDGFRA, PMS2, POLD1, POLE, PTEN, RAD50, RAD51C, RAD51D, RNF43, SDHB, SDHC, SDHD, SMAD4, SMARCA4. STK11, TP53, TSC1, TSC2, and VHL.  The following genes were evaluated for sequence changes only: SDHA and HOXB13 c.251G>A variant only.    11/17/2018 Pathology Results   Diagnosis 11/17/18 1. Soft tissue, biopsy, Diaphragmatic nodules - METASTATIC ADENOCARCINOMA, CONSISTENT WITH PATIENT'S CLINICAL HISTORY OF CHOLANGIOCARCINOMA. SEE NOTE 2. Liver, biopsy, Left - LIVER PARENCHYMA WITH A BENIGN FIBROTIC NODULE - NO EVIDENCE OF MALIGNANCY 3. Stomach, biopsy - BENIGN PAPILLARY MESOTHELIAL HYPERPLASIA - NO EVIDENCE OF MALIGNANCY   11/29/2018 Imaging   CT CAP WO Contrast  IMPRESSION: 1. Dominant liver mass appears grossly stable from 09/29/2018. Additional liver lesions are too small to characterize but were not shown to be hypermetabolic on PET. 2. Mild nodularity of both adrenal glands with associated hypermetabolism on 09/29/2018. Continued attention on follow-up exams is warranted. 3. Small right lower lobe nodules, stable from 09/07/2018. Again, attention on follow-up is recommended. 4. Trace bilateral pleural fluid. 5. Aortic atherosclerosis (ICD10-170.0). Coronary artery calcification. 6. Enlarged pulmonic trunk, indicative of pulmonary arterial hypertension.     11/30/2018 -  Chemotherapy   First line chemo oxaliplatin and gemcitabine q2weeks starting 11/30/18.    01/20/2019 Imaging   CT CAP IMPRESSION: restaging  1. Mild interval increase in size of dominant liver mass involving segment 4 and segment 5. 2. No significant or progressive adrenal nodularity identified to suggest metastatic disease. 3. Unchanged appearance of small right lower lobe and lingular lung nodules, nonspecific.      CURRENT THERAPY:  First line chemoOxaliplatinand gemcitabine q2weeksstarting 11/30/18.  INTERVAL HISTORY:  Donna May is  here for a follow up and treatment. She presents to the clinic alone. She notes she did not take her BP medication at home before coming and her BP was 205/85. She took her BP  medication in clinic. She denies chest discomfort or HA.  She notes her energy is adequate mostly but will rest as needed. She notes last month she had constipation. She notes last cycle she felt jittery intermittently. She denies this being attributed to feeling cold. She notes doing this time she will feel nervous. I reviewed her medication list with her. She notes she has heard bad things about Metformin but she has still been taking it. She also notes skin boil at mid back. She notes there was a blackhead there and in the last 2 weeks it came to a head.     REVIEW OF SYSTEMS:   Constitutional: Denies fevers, chills or abnormal weight loss Eyes: Denies blurriness of vision Ears, nose, mouth, throat, and face: Denies mucositis or sore throat Respiratory: Denies cough, dyspnea or wheezes Cardiovascular: Denies palpitation, chest discomfort or lower extremity swelling Gastrointestinal:  Denies nausea, heartburn or change in bowel habits Skin: Denies abnormal skin rashes (+) Skin boil at mid back  Lymphatics: Denies new lymphadenopathy or easy bruising Neurological:Denies numbness, tingling or new weaknesses Behavioral/Psych: Mood is stable, no new changes  All other systems were reviewed with the patient and are negative.  MEDICAL HISTORY:  Past Medical History:  Diagnosis Date   Anemia    Anxiety    Arthritis    Breast cancer ([place]) 1992   Cataract    Bilateral eyes - surgery to remove   Chronic kidney disease    CKD stage 3   Complication of anesthesia    "something they use make me itch for a couple of days."   Depression    Diabetes mellitus    type 2   Duodenitis 01/18/2002   Fainting spell    Family history of breast cancer    Family history of prostate cancer    GERD (gastroesophageal  reflux disease)    Heart murmur    never has caused any problems   Hiatal hernia 08/08/2008, 01/18/2002   History of pneumonia    x 2   Hyperlipidemia    Hypertension    Liver cancer ([place]) 08/2018   Lymphedema 2017   Right arm   Pneumonia    x 2   UTI (lower urinary tract infection)     SURGICAL HISTORY: Past Surgical History:  Procedure Laterality Date   AXILLARY SURGERY     cyst removal, right   COLONOSCOPY  09/23/2018   Dr. Havery Moros - polyps   EYE SURGERY Bilateral    cataracts to remove   LAPAROSCOPY N/A 11/17/2018   Procedure: LAPAROSCOPY DIAGNOSTIC, INTRAOPERATIVE ULTRASOUND, PERITONEAL BIOPSIES;  Surgeon: [name], MD;  Location: [facility];  Service: General;  Laterality: N/A;  GENERAL AND EPIDURAL   LIVER BIOPSY  08/2018   Dr. Lindwood Coke   MASTECTOMY  1992   right, with flap   NM [place]  06/11/2009   Protocol:[place], post stress EF58%, EKG negative for ischemia, low risk   PORTACATH PLACEMENT N/A 12/02/2018   Procedure: INSERTION PORT-A-CATH;  Surgeon: [name], MD;  Location: [facility];  Service: General;  Laterality: N/A;   RECONSTRUCTION BREAST W/ TRAM FLAP Right    TONSILLECTOMY     TRANSTHORACIC ECHOCARDIOGRAM  12/24/2009   LVEF =>55%, normal study   UPPER GI ENDOSCOPY      I have reviewed the social history and family history with the patient and they are unchanged from previous note.  ALLERGIES:  has No Known Allergies.  MEDICATIONS:  Current Outpatient Medications  Medication Sig Dispense Refill   amLODipine (NORVASC) 10 MG tablet TAKE 1 TABLET BY MOUTH EVERY DAY (Patient taking differently: Take 10 mg by mouth daily. ) 90 tablet 2   aspirin 81 MG tablet Take 81 mg by mouth at bedtime.      CALCIUM-MAGNESIUM-VITAMIN D PO Take 1 tablet by mouth once a week.      Cholecalciferol (VITAMIN D3) 2000 UNITS TABS Take 1 tablet by mouth once a week.      Echinacea 400 MG CAPS Take 400 mg by mouth daily.      glucose  blood test strip 1 each by Other route 2 (two) times daily. Use Onetouch verio test strips as instructed to check blood sugar twice daily. 100 each 2   hydrochlorothiazide (HYDRODIURIL) 25 MG tablet TAKE 1 TABLET DAILY (TO    REPLACE COMBINATION) 90 tablet 0   HYDROcodone-acetaminophen (NORCO/VICODIN) 5-325 MG tablet Take 1 tablet by mouth every 6 (six) hours as needed for moderate pain. 10 tablet 0   ketoconazole (NIZORAL) 2 % cream Apply 1 fingertip amount to each foot daily. (Patient taking differently: Apply 1 application topically daily as needed (affected area of foot). Apply 1 fingertip amount to each foot daily.) 30 g 0   lansoprazole (PREVACID) 15 MG capsule TAKE 1 CAPSULE DAILY BEFOREBREAKFAST (NEED TO MAKE AN OFFICE VISIT FOR FURTHER   REFILLS) (Patient taking differently: Take 15 mg by mouth daily. ) 90 capsule 1   lidocaine-prilocaine (EMLA) cream Apply 1 application topically as needed. 30 g 0   lidocaine-prilocaine (EMLA) cream Apply to affected area once 30 g 3   loratadine (CLARITIN) 10 MG tablet Take 10 mg by mouth daily as needed for allergies.     metFORMIN (GLUCOPHAGE-XR) 500 MG 24 hr tablet Take 1 tablet (500 mg total) by mouth 2 (two) times daily before a meal. 180 tablet 1   metoprolol succinate (TOPROL-XL) 100 MG 24 hr tablet Take 1 tablet (100 mg total) by mouth daily. Take with or immediately following a meal. 90 tablet 1   Multiple Vitamins-Minerals (CENTRUM SILVER ULTRA WOMENS) TABS Take 1 tablet by mouth daily.      Omega 3 1000 MG CAPS Take 2,000 mg by mouth daily.      ondansetron (ZOFRAN) 8 MG tablet Take 1 tablet (8 mg total) by mouth every 8 (eight) hours as needed for nausea or vomiting. 30 tablet 0   prochlorperazine (COMPAZINE) 10 MG tablet Take 1 tablet (10 mg total) by mouth every 6 (six) hours as needed for nausea or vomiting. 30 tablet 0   raloxifene (EVISTA) 60 MG tablet TAKE 1 TABLET DAILY 90 tablet 0   rosuvastatin (CRESTOR) 20 MG tablet Take  1 tablet (20 mg total) by mouth daily. 90 tablet 1   sitaGLIPtin (JANUVIA) 100 MG tablet Take 1 tablet (100 mg total) by mouth daily. 90 tablet 4   triamcinolone cream (KENALOG) 0.1 % Apply 1 application topically 2 (two) times daily. (Patient taking differently: Apply 1 application topically 2 (two) times daily as needed (rash). ) 30 g 0   valsartan (DIOVAN) 320 MG tablet TAKE 1 TABLET DAILY (TO    REPLACE COMBINATION) 90 tablet 0   Current Facility-Administered Medications  Medication Dose Route Frequency Provider Last Rate Last Dose   triamcinolone acetonide (KENALOG) 10 MG/ML injection 10 mg  10 mg Other Once Harriet Masson, DPM       Facility-Administered Medications Ordered in Other Visits  Medication Dose Route Frequency Provider Last Rate  Last Dose   dextrose 5 % solution   Intravenous Once [name], MD       gemcitabine [facility]) 1,748 mg in sodium chloride 0.9 % 250 mL chemo infusion  1,000 mg/m2 (Treatment Plan Recorded) Intravenous Once [name], MD       heparin lock flush 100 unit/mL  500 Units Intracatheter Once PRN [name], MD       oxaliplatin (ELOXATIN) 125 mg in dextrose 5 % 500 mL chemo infusion  70 mg/m2 (Treatment Plan Recorded) Intravenous Once [name], MD       sodium chloride flush (NS) 0.9 % injection 10 mL  10 mL Intracatheter PRN [name], MD        PHYSICAL EXAMINATION: ECOG PERFORMANCE STATUS: 1 - Symptomatic but completely ambulatory  Vitals:   02/22/19 1004 02/22/19 1005  BP: (!) 210/79 (!) 205/85  Pulse: 85   Resp: 18   Temp: 97.8 F (36.6 C)   SpO2: 100%    Filed Weights   02/22/19 1004  Weight: 151 lb 1.6 oz (68.5 kg)    GENERAL:alert, no distress and comfortable SKIN: skin color, texture, turgor are normal, no rashes or significant lesions (+) Skin boil at mid back, healing EYES: normal, Conjunctiva are pink and non-injected, sclera clear  NECK: supple, thyroid normal size, non-tender, without nodularity LYMPH:  no palpable  lymphadenopathy in the cervical, axillary  LUNGS: clear to auscultation and percussion with normal breathing effort HEART: regular rate & rhythm and no murmurs and no lower extremity edema ABDOMEN:abdomen soft, non-tender and normal bowel sounds Musculoskeletal:no cyanosis of digits and no clubbing  NEURO: alert & oriented x 3 with fluent speech, no focal motor/sensory deficits  LABORATORY DATA:  I have reviewed the data as listed CBC Latest Ref Rng & Units 02/22/2019 02/07/2019 01/25/2019  WBC 4.0 - 10.5 K/uL 3.7(L) 3.9(L) 5.4  Hemoglobin 12.0 - 15.0 g/dL 7.9(L) 8.6(L) 8.7(L)  Hematocrit 36.0 - 46.0 % 25.7(L) 27.7(L) 27.3(L)  Platelets 150 - 400 K/uL 175 100(L) 128(L)     CMP Latest Ref Rng & Units 02/22/2019 02/07/2019 01/25/2019  Glucose 70 - 99 mg/dL 144(H) 96 136(H)  BUN 8 - 23 mg/dL '15 13 15  '$ Creatinine 0.44 - 1.00 mg/dL 1.17(H) 1.17(H) 1.16(H)  Sodium 135 - 145 mmol/L 145 144 142  Potassium 3.5 - 5.1 mmol/L 3.9 4.1 3.9  Chloride 98 - 111 mmol/L 110 109 109  CO2 22 - 32 mmol/L '27 26 27  '$ Calcium 8.9 - 10.3 mg/dL 8.5(L) 9.0 8.6(L)  Total Protein 6.5 - 8.1 g/dL 6.6 6.9 6.7  Total Bilirubin 0.3 - 1.2 mg/dL <0.2(L) 0.3 0.2(L)  Alkaline Phos 38 - 126 U/L 64 62 60  AST 15 - 41 U/L 36 47(H) 39  ALT 0 - 44 U/L '16 19 15      '$ RADIOGRAPHIC STUDIES: I have personally reviewed the radiological images as listed and agreed with the findings in the report. No results found.   ASSESSMENT & PLAN:  JAMISYN LANGER is a 78 y.o. female with   1.Intrahepatic cholangiocarcinoma, cT1N0M1,with peritoneal metastasis, MSS, IDH1 mutation (+) -She was diagnosed in 08/2018. Her biopsy of her liver mass shows adenocarcinoma,most consistent withcholangiocarcinoma. Her EGD/Colonoscopy from 7/10/20was negativeand 09/29/18 PET scanshowedno evidence of distant metastasis. -She was brought to OR on 9/3,unfortunately she was found to have peritoneal metastasis to right diaphragm and surgery was  aborted. -Given her metastatic cancer and ineligibility forsurgery, I started her on systemicchemo with First line chemooxaliplatin and gemcitabine  q2weeksto control her disease. She started on 11/30/18.  -Her FO results showed MSI stable disease, IDH mutation positive.I would consider IDH 1 inhibitor in the future.Not a candidate for immunotherapy -S/p C6 she is tolerating treatment moderately well. She did have constipation with last cycle. Labs reviewed, anemia has progressed, but still adequate to proceed with C7 Gemcitabine and Oxaliplatin.  -Plan for repeat scan before 1/7. Will use port for contrast.  -For her skin boil on back, I recommend warm compress and topical antibiotics as this is healing.  -F/u in 2 weeks with NP Laice.   2. Anemia -Secondary to chemo  -Hg at 7.9 today (02/22/19). She is asymptomatic, she feels energy is mostly adequate. May give Blood transfusion soon. Will send blood to blood bank on next visit  -I recommend she start prenatal vitamin.  3. H/o right breast cancer, Geneticsnegative -s/p mastectomy in 1992, per patient did not require adjuvant therapy  -followed by Dr. Jana Hakim -Her genetic testing wasnegativefor pathogeneticmutations. She did have VUS of gene APC.  4.Comorbidities:DM, HTN, hiatal hernia, GERD, renal artery stenosis -f/u with PCP -We will monitor her blood pressure and glucose level closely during her chemo. We will adjust her meds as needed -Given she is on steroids I encouraged her to watch her BG at home daily and to reduce sugar intake the week of chemo.She did not tolerate Glucerna, she will continue low sugar protein shakes.  -She plans to receive BP Monitor from insurance to monitor BP at home.   5. Family support  -she has a son and daughter,they were on the phone during her visit today  6.Goal of care discussion  -The patient understands the goal of care is palliative. -she is full code now  7.  Hypertension, uncontrolled  -BP at 205/85 today (02/22/19) -She did not take Medication before coming to clinic but did take in clinic. Will repeat in infusion room today.  -Per patient her BP is usually well controlled.    PLAN: -Labs reviewed and adequate to proceed with C7Oxaliplatin andGemcitabineat same dose.  -Lab, flush, f/u with NP Lacie andGemcitabine and Oxaliplatin in 2 weeks -Lab, flush, f/u and chemo Gemcitabine/Oxaliplatin in 4 weeks with PETa few days before  No problem-specific Assessment & Plan notes found for this encounter.   Orders Placed This Encounter  Procedures   NM PET Image Restag (PS) Skull Base To Thigh    Standing Status:   Future    Standing Expiration Date:   02/22/2020    Order Specific Question:   If indicated for the ordered procedure, I authorize the administration of a radiopharmaceutical per Radiology protocol    Answer:   Yes    Order Specific Question:   Preferred imaging location?    Answer:   [facility]    Order Specific Question:   Radiology Contrast Protocol - do NOT remove file path    Answer:   [email or web address]   Sample to Blood Bank    Standing Status:   Future    Standing Expiration Date:   02/22/2020   All questions were answered. The patient knows to call the clinic with any problems, questions or concerns. No barriers to learning was detected. I spent 20 minutes counseling the patient face to face. The total time spent in the appointment was 25 minutes and more than 50% was on counseling and review of test results     [name], MD 02/22/2019   I, Joslyn Devon, am acting as scribe for [employer]  Burr Medico, MD.   I have reviewed the above documentation for accuracy and completeness, and I agree with the above.

## 2019-02-21 ENCOUNTER — Encounter: Payer: Self-pay | Admitting: *Deleted

## 2019-02-22 ENCOUNTER — Other Ambulatory Visit: Payer: Self-pay

## 2019-02-22 ENCOUNTER — Inpatient Hospital Stay: Payer: Medicare Other

## 2019-02-22 ENCOUNTER — Encounter: Payer: Self-pay | Admitting: Hematology

## 2019-02-22 ENCOUNTER — Inpatient Hospital Stay (HOSPITAL_BASED_OUTPATIENT_CLINIC_OR_DEPARTMENT_OTHER): Payer: Medicare Other | Admitting: Hematology

## 2019-02-22 ENCOUNTER — Inpatient Hospital Stay: Payer: Medicare Other | Attending: Adult Health

## 2019-02-22 VITALS — BP 194/81

## 2019-02-22 VITALS — BP 205/85 | HR 85 | Temp 97.8°F | Resp 18 | Ht 65.0 in | Wt 151.1 lb

## 2019-02-22 DIAGNOSIS — Z5111 Encounter for antineoplastic chemotherapy: Secondary | ICD-10-CM | POA: Insufficient documentation

## 2019-02-22 DIAGNOSIS — C221 Intrahepatic bile duct carcinoma: Secondary | ICD-10-CM | POA: Insufficient documentation

## 2019-02-22 DIAGNOSIS — E1121 Type 2 diabetes mellitus with diabetic nephropathy: Secondary | ICD-10-CM | POA: Diagnosis not present

## 2019-02-22 DIAGNOSIS — Z95828 Presence of other vascular implants and grafts: Secondary | ICD-10-CM

## 2019-02-22 DIAGNOSIS — I2510 Atherosclerotic heart disease of native coronary artery without angina pectoris: Secondary | ICD-10-CM | POA: Diagnosis not present

## 2019-02-22 DIAGNOSIS — E1122 Type 2 diabetes mellitus with diabetic chronic kidney disease: Secondary | ICD-10-CM

## 2019-02-22 DIAGNOSIS — C786 Secondary malignant neoplasm of retroperitoneum and peritoneum: Secondary | ICD-10-CM | POA: Diagnosis not present

## 2019-02-22 DIAGNOSIS — N1832 Chronic kidney disease, stage 3b: Secondary | ICD-10-CM

## 2019-02-22 LAB — CBC WITH DIFFERENTIAL (CANCER CENTER ONLY)
Abs Immature Granulocytes: 0.02 10*3/uL (ref 0.00–0.07)
Basophils Absolute: 0 10*3/uL (ref 0.0–0.1)
Basophils Relative: 0 %
Eosinophils Absolute: 0.1 10*3/uL (ref 0.0–0.5)
Eosinophils Relative: 1 %
HCT: 25.7 % — ABNORMAL LOW (ref 36.0–46.0)
Hemoglobin: 7.9 g/dL — ABNORMAL LOW (ref 12.0–15.0)
Immature Granulocytes: 1 %
Lymphocytes Relative: 27 %
Lymphs Abs: 1 10*3/uL (ref 0.7–4.0)
MCH: 29.5 pg (ref 26.0–34.0)
MCHC: 30.7 g/dL (ref 30.0–36.0)
MCV: 95.9 fL (ref 80.0–100.0)
Monocytes Absolute: 1 10*3/uL (ref 0.1–1.0)
Monocytes Relative: 27 %
Neutro Abs: 1.6 10*3/uL — ABNORMAL LOW (ref 1.7–7.7)
Neutrophils Relative %: 44 %
Platelet Count: 175 10*3/uL (ref 150–400)
RBC: 2.68 MIL/uL — ABNORMAL LOW (ref 3.87–5.11)
RDW: 19.9 % — ABNORMAL HIGH (ref 11.5–15.5)
WBC Count: 3.7 10*3/uL — ABNORMAL LOW (ref 4.0–10.5)
nRBC: 0 % (ref 0.0–0.2)

## 2019-02-22 LAB — CMP (CANCER CENTER ONLY)
ALT: 16 U/L (ref 0–44)
AST: 36 U/L (ref 15–41)
Albumin: 3.2 g/dL — ABNORMAL LOW (ref 3.5–5.0)
Alkaline Phosphatase: 64 U/L (ref 38–126)
Anion gap: 8 (ref 5–15)
BUN: 15 mg/dL (ref 8–23)
CO2: 27 mmol/L (ref 22–32)
Calcium: 8.5 mg/dL — ABNORMAL LOW (ref 8.9–10.3)
Chloride: 110 mmol/L (ref 98–111)
Creatinine: 1.17 mg/dL — ABNORMAL HIGH (ref 0.44–1.00)
GFR, Est AFR Am: 51 mL/min — ABNORMAL LOW (ref >60)
GFR, Estimated: 44 mL/min — ABNORMAL LOW (ref >60)
Glucose, Bld: 144 mg/dL — ABNORMAL HIGH (ref 70–99)
Potassium: 3.9 mmol/L (ref 3.5–5.1)
Sodium: 145 mmol/L (ref 135–145)
Total Bilirubin: <0.2 mg/dL — ABNORMAL LOW (ref 0.3–1.2)
Total Protein: 6.6 g/dL (ref 6.5–8.1)

## 2019-02-22 MED ORDER — DEXAMETHASONE SODIUM PHOSPHATE 10 MG/ML IJ SOLN
10.0000 mg | Freq: Once | INTRAMUSCULAR | Status: AC
  Administered 2019-02-22: 10 mg via INTRAVENOUS

## 2019-02-22 MED ORDER — PALONOSETRON HCL INJECTION 0.25 MG/5ML
INTRAVENOUS | Status: AC
  Filled 2019-02-22: qty 5

## 2019-02-22 MED ORDER — PALONOSETRON HCL INJECTION 0.25 MG/5ML
0.2500 mg | Freq: Once | INTRAVENOUS | Status: AC
  Administered 2019-02-22: 0.25 mg via INTRAVENOUS

## 2019-02-22 MED ORDER — SODIUM CHLORIDE 0.9 % IV SOLN
1000.0000 mg/m2 | Freq: Once | INTRAVENOUS | Status: AC
  Administered 2019-02-22: 12:00:00 1748 mg via INTRAVENOUS
  Filled 2019-02-22: qty 45.97

## 2019-02-22 MED ORDER — HEPARIN SOD (PORK) LOCK FLUSH 100 UNIT/ML IV SOLN
500.0000 [IU] | Freq: Once | INTRAVENOUS | Status: AC | PRN
  Administered 2019-02-22: 500 [IU]
  Filled 2019-02-22: qty 5

## 2019-02-22 MED ORDER — DEXAMETHASONE SODIUM PHOSPHATE 10 MG/ML IJ SOLN
INTRAMUSCULAR | Status: AC
  Filled 2019-02-22: qty 1

## 2019-02-22 MED ORDER — SODIUM CHLORIDE 0.9% FLUSH
10.0000 mL | Freq: Once | INTRAVENOUS | Status: DC
  Filled 2019-02-22: qty 10

## 2019-02-22 MED ORDER — OXALIPLATIN CHEMO INJECTION 100 MG/20ML
70.0000 mg/m2 | Freq: Once | INTRAVENOUS | Status: AC
  Administered 2019-02-22: 125 mg via INTRAVENOUS
  Filled 2019-02-22: qty 20

## 2019-02-22 MED ORDER — DEXTROSE 5 % IV SOLN
Freq: Once | INTRAVENOUS | Status: DC
  Filled 2019-02-22: qty 250

## 2019-02-22 MED ORDER — SODIUM CHLORIDE 0.9 % IV SOLN
Freq: Once | INTRAVENOUS | Status: AC
  Administered 2019-02-22: 11:00:00 via INTRAVENOUS
  Filled 2019-02-22: qty 250

## 2019-02-22 MED ORDER — SODIUM CHLORIDE 0.9% FLUSH
10.0000 mL | INTRAVENOUS | Status: DC | PRN
  Administered 2019-02-22: 10 mL
  Filled 2019-02-22: qty 10

## 2019-02-22 NOTE — Progress Notes (Signed)
Okay to treat today with Hgb 7.9 and elevated BP 194/81, per Dr. Burr Medico. Pt took home BP meds during office visit.

## 2019-02-22 NOTE — Patient Instructions (Signed)
[facility] Discharge Instructions for Patients Receiving Chemotherapy  Today you received the following chemotherapy agents Gemcitabine (GEMZAR) & Oxaliplatin (PARAPLATIN).  To help prevent nausea and vomiting after your treatment, we encourage you to take your nausea medication as prescribed.   If you develop nausea and vomiting that is not controlled by your nausea medication, call the clinic.   BELOW ARE SYMPTOMS THAT SHOULD BE REPORTED IMMEDIATELY:  *FEVER GREATER THAN 100.5 F  *CHILLS WITH OR WITHOUT FEVER  NAUSEA AND VOMITING THAT IS NOT CONTROLLED WITH YOUR NAUSEA MEDICATION  *UNUSUAL SHORTNESS OF BREATH  *UNUSUAL BRUISING OR BLEEDING  TENDERNESS IN MOUTH AND THROAT WITH OR WITHOUT PRESENCE OF ULCERS  *URINARY PROBLEMS  *BOWEL PROBLEMS  UNUSUAL RASH Items with * indicate a potential emergency and should be followed up as soon as possible.  Feel free to call the clinic should you have any questions or concerns. The clinic phone number is [phone].  Please show the [place] at check-in to the Emergency Department and triage nurse.

## 2019-02-23 ENCOUNTER — Telehealth: Payer: Self-pay | Admitting: Hematology

## 2019-02-23 NOTE — Telephone Encounter (Signed)
Scheduled appt per 12/9 los.  Spoke with pt and she stated she was not home to write it down.  She stated she was going to check my-chart and if she did not understand the appt dates and time, then she will call back.

## 2019-03-02 ENCOUNTER — Other Ambulatory Visit: Payer: Medicare Other

## 2019-03-03 ENCOUNTER — Other Ambulatory Visit: Payer: Self-pay | Admitting: Cardiovascular Disease

## 2019-03-06 NOTE — Telephone Encounter (Signed)
Rx(s) sent to pharmacy electronically.  

## 2019-03-07 ENCOUNTER — Ambulatory Visit: Payer: Medicare Other | Admitting: Endocrinology

## 2019-03-07 NOTE — Progress Notes (Signed)
[name]   [phone] Fax:(336) [phone]   Clinic Follow up Note   Patient Care Team: [name], MD as PCP - General (Family Medicine) [name], [name] Dad, MD as Consulting Physician (Oncology) [name], [name], MD as Consulting Physician (Cardiology) [name], MD as Consulting Physician (Obstetrics and Gynecology) [name], [name], NP as Nurse Practitioner (Hematology and Oncology) [name] [name], MD as Consulting Physician (Hematology) [name], [name], MD as Consulting Physician (Gastroenterology) [name], RN (Inactive) as Oncology Nurse Navigator [name], MD as Consulting Physician (General Surgery) 03/08/2019  CHIEF COMPLAINT: F/u cholangiocarcinoma  SUMMARY OF ONCOLOGIC HISTORY: Oncology History  Malignant neoplasm of female breast ([place])  11/06/2018 Genetic Testing   Negative genetic testing on the [facility] Common Hereditary Cancers panel. A variant of uncertain significance was identified in one of her APC genes, called c.1243G>A (p.Ala415Thr).  The Common Hereditary Cancers Panel offered by [facility] includes sequencing and/or deletion duplication testing of the following 48 genes: APC, ATM, AXIN2, BARD1, BMPR1A, BRCA1, BRCA2, BRIP1, CDH1, CDK4, CDKN2A (p14ARF), CDKN2A (p16INK4a), CHEK2, CTNNA1, DICER1, EPCAM (Deletion/duplication testing only), GREM1 (promoter region deletion/duplication testing only), KIT, MEN1, MLH1, MSH2, MSH3, MSH6, MUTYH, NBN, NF1, NHTL1, PALB2, PDGFRA, PMS2, POLD1, POLE, PTEN, RAD50, RAD51C, RAD51D, RNF43, SDHB, SDHC, SDHD, SMAD4, SMARCA4. STK11, TP53, TSC1, TSC2, and VHL.  The following genes were evaluated for sequence changes only: SDHA and HOXB13 c.251G>A variant only.    11/30/2018 -  Chemotherapy   First line chemo oxaliplatin and gemcitabine q2weeks starting 11/30/18.    Intrahepatic cholangiocarcinoma ([place])  09/07/2018 Imaging   CT Chest IMPRESSION: 1.  New, enhancing mass involving segment 4 of the liver and fundus of gallbladder is concerning for malignancy. This may represent either metastatic disease from breast cancer or neoplasm primary to the liver or hepatic biliary tree. Further evaluation with contrast enhanced CT of the abdomen and pelvis is recommended. 2. No findings to suggest metastatic disease within the chest. 3.  Aortic Atherosclerosis (ICD10-I70.0). 4. Coronary artery calcifications.   09/13/2018 Pathology Results   Diagnosis Liver, needle/core biopsy - ADENOCARCINOMA. Microscopic Comment Immunohistochemistry for CK7 is positive. CK20, TTF1, CDX-2, GATA-3, PAX 8, Qualitative ER, p63 and CK5/6 are negative. The provided clinical history of remote mammary carcinoma is noted. Based on the morphology and immunophenotype of the adenocarcinoma observed in this specimen, primary cholangiocarcinoma is favored. Clinical and radiologic correlation are  encouraged. Results reported to [employer] on 09/15/2018. Intradepartmental consultation (Dr. Vic Ripper).   09/13/2018 Initial Diagnosis   Cholangiocarcinoma ([place])   09/23/2018 Procedure   Colonoscopy by Dr. Havery Moros 09/23/18  IMPRESSION - Two 3 to 4 mm polyps in the ascending colon, removed with a cold snare. Resected and retrieved. - Five 3 to 5 mm polyps in the transverse colon, removed with a cold snare. Resected and retrieved. - One 5 mm polyp at the splenic flexure, removed with a cold snare. Resected and retrieved. - Three 3 to 5 mm polyps in the sigmoid colon, removed with a cold snare. Resected and retrieved. - The examination was otherwise normal. Upper Endopscy by Dr. Havery Moros 09/23/18  IMPRESSION - Esophagogastric landmarks identified. - 2 cm hiatal hernia. - Normal esophagus otherwise. - A single gastric polyp. Resected and retrieved. - Mild gastritis. Biopsied. - Normal duodenal bulb and second portion of the duodenum.   09/23/2018 Pathology Results    Diagnosis 09/23/18 1. Surgical [P], duodenum - BENIGN SMALL BOWEL MUCOSA. - NO ACTIVE INFLAMMATION OR VILLOUS ATROPHY IDENTIFIED. 2. Surgical [  P], stomach, polyp - HYPERPLASTIC POLYP(S). - THERE IS NO EVIDENCE OF MALIGNANCY. 3. Surgical [P], gastric antrum and gastric body - CHRONIC INACTIVE GASTRITIS. - THERE IS NO EVIDENCE OF HELICOBACTER-PYLORI, DYSPLASIA, OR MALIGNANCY. - SEE COMMENT. 4. Surgical [P], colon, sigmoid, splenic flexure, transverse and ascending, polyp (9) - TUBULAR ADENOMA(S). - SESSILE SERRATED POLYP WITHOUT CYTOLOGIC DYSPLASIA. - HIGH GRADE DYSPLASIA IS NOT IDENTIFIED. 5. Surgical [P], colon, sigmoid, polyp (2) - HYPERPLASTIC POLYP(S). - THERE IS NO EVIDENCE OF MALIGNANCY.   09/23/2018 Cancer Staging   Staging form: Intrahepatic Bile Duct, AJCC 8th Edition - Clinical stage from 09/23/2018: Stage IB (cT1b, cN0, cM0) - Signed by [name], MD on 10/06/2018   09/29/2018 PET scan   PET 09/29/18 IMPRESSION: 1. Hypermetabolic mass in the RIGHT hepatic lobe consistent with biopsy proven adenocarcinoma. No additional liver metastasis. 2. No evidence of local breast cancer recurrence in the RIGHT breast or RIGHT axilla. 3. Mild bilateral hypermetabolic adrenal glands is favored benign hyperplasia. 4. No evidence of additional metastatic disease on skull base to thigh FDG PET scan.   11/06/2018 Genetic Testing   Negative genetic testing on the [employer] Common Hereditary Cancers panel. A variant of uncertain significance was identified in one of her APC genes, called c.1243G>A (p.Ala415Thr).  The Common Hereditary Cancers Panel offered by [facility] includes sequencing and/or deletion duplication testing of the following 48 genes: APC, ATM, AXIN2, BARD1, BMPR1A, BRCA1, BRCA2, BRIP1, CDH1, CDK4, CDKN2A (p14ARF), CDKN2A (p16INK4a), CHEK2, CTNNA1, DICER1, EPCAM (Deletion/duplication testing only), GREM1 (promoter region deletion/duplication testing only), KIT, MEN1, MLH1, MSH2, MSH3,  MSH6, MUTYH, NBN, NF1, NHTL1, PALB2, PDGFRA, PMS2, POLD1, POLE, PTEN, RAD50, RAD51C, RAD51D, RNF43, SDHB, SDHC, SDHD, SMAD4, SMARCA4. STK11, TP53, TSC1, TSC2, and VHL.  The following genes were evaluated for sequence changes only: SDHA and HOXB13 c.251G>A variant only.    11/17/2018 Pathology Results   Diagnosis 11/17/18 1. Soft tissue, biopsy, Diaphragmatic nodules - METASTATIC ADENOCARCINOMA, CONSISTENT WITH PATIENT'S CLINICAL HISTORY OF CHOLANGIOCARCINOMA. SEE NOTE 2. Liver, biopsy, Left - LIVER PARENCHYMA WITH A BENIGN FIBROTIC NODULE - NO EVIDENCE OF MALIGNANCY 3. Stomach, biopsy - BENIGN PAPILLARY MESOTHELIAL HYPERPLASIA - NO EVIDENCE OF MALIGNANCY   11/29/2018 Imaging   CT CAP WO Contrast  IMPRESSION: 1. Dominant liver mass appears grossly stable from 09/29/2018. Additional liver lesions are too small to characterize but were not shown to be hypermetabolic on PET. 2. Mild nodularity of both adrenal glands with associated hypermetabolism on 09/29/2018. Continued attention on follow-up exams is warranted. 3. Small right lower lobe nodules, stable from 09/07/2018. Again, attention on follow-up is recommended. 4. Trace bilateral pleural fluid. 5. Aortic atherosclerosis (ICD10-170.0). Coronary artery calcification. 6. Enlarged pulmonic trunk, indicative of pulmonary arterial hypertension.     11/30/2018 -  Chemotherapy   First line chemo oxaliplatin and gemcitabine q2weeks starting 11/30/18.    01/20/2019 Imaging   CT CAP IMPRESSION: restaging  1. Mild interval increase in size of dominant liver mass involving segment 4 and segment 5. 2. No significant or progressive adrenal nodularity identified to suggest metastatic disease. 3. Unchanged appearance of small right lower lobe and lingular lung nodules, nonspecific.     CURRENT THERAPY: First line chemoOxaliplatinand gemcitabine q2weeksstarting 11/30/18.  INTERVAL HISTORY: Ms. Neyens returns for f/u and treatment as  scheduled. She completed cycle 7 on 02/22/19. She feels well overall. Appetite is good. She has a lot of energy to drive and do what she wants. Has mild fatigue on day 3 after chemo. She has intermittent cold sensitivity, and  mild tingling in her feet. No balance issue or fall. She has ongoing mild intermittent epigastric pain for years, stable. Denies n/v. Has normal bowel movement "when I eat." Hands are dry and slightly dark. Denies fever, chills, cough, chest pain, dyspnea, or leg swelling.     MEDICAL HISTORY:  Past Medical History:  Diagnosis Date  . Anemia   . Anxiety   . Arthritis   . Breast cancer ([place]) 1992  . Cataract    Bilateral eyes - surgery to remove  . Chronic kidney disease    CKD stage 3  . Complication of anesthesia    "something they use make me itch for a couple of days."  . Depression   . Diabetes mellitus    type 2  . Duodenitis 01/18/2002  . Fainting spell   . Family history of breast cancer   . Family history of prostate cancer   . GERD (gastroesophageal reflux disease)   . Heart murmur    never has caused any problems  . Hiatal hernia 08/08/2008, 01/18/2002  . History of pneumonia    x 2  . Hyperlipidemia   . Hypertension   . Liver cancer ([place]) 08/2018  . Lymphedema 2017   Right arm  . Pneumonia    x 2  . UTI (lower urinary tract infection)     SURGICAL HISTORY: Past Surgical History:  Procedure Laterality Date  . AXILLARY SURGERY     cyst removal, right  . COLONOSCOPY  09/23/2018   Dr. Havery Moros - polyps  . EYE SURGERY Bilateral    cataracts to remove  . LAPAROSCOPY N/A 11/17/2018   Procedure: LAPAROSCOPY DIAGNOSTIC, INTRAOPERATIVE ULTRASOUND, PERITONEAL BIOPSIES;  Surgeon: [name], MD;  Location: [facility];  Service: General;  Laterality: N/A;  GENERAL AND EPIDURAL  . LIVER BIOPSY  08/2018   Dr. Lindwood Coke  . MASTECTOMY  1992   right, with flap  . NM MYOCAR PERF WALL MOTION  06/11/2009   Protocol:[place], post stress EF58%, EKG negative for  ischemia, low risk  . PORTACATH PLACEMENT N/A 12/02/2018   Procedure: INSERTION PORT-A-CATH;  Surgeon: [name], MD;  Location: [facility] [place];  Service: General;  Laterality: N/A;  . RECONSTRUCTION BREAST W/ TRAM FLAP Right   . TONSILLECTOMY    . TRANSTHORACIC ECHOCARDIOGRAM  12/24/2009   LVEF =>55%, normal study  . UPPER GI ENDOSCOPY      I have reviewed the social history and family history with the patient and they are unchanged from previous note.  ALLERGIES:  has No Known Allergies.  MEDICATIONS:  Current Outpatient Medications  Medication Sig Dispense Refill  . amLODipine (NORVASC) 10 MG tablet TAKE 1 TABLET BY MOUTH EVERY DAY 90 tablet 3  . aspirin 81 MG tablet Take 81 mg by mouth at bedtime.     Marland Kitchen CALCIUM-MAGNESIUM-VITAMIN D PO Take 1 tablet by mouth once a week.     . Cholecalciferol (VITAMIN D3) 2000 UNITS TABS Take 1 tablet by mouth once a week.     . Echinacea 400 MG CAPS Take 400 mg by mouth daily.     Marland Kitchen glucose blood test strip 1 each by Other route 2 (two) times daily. Use Onetouch verio test strips as instructed to check blood sugar twice daily. 100 each 2  . hydrochlorothiazide (HYDRODIURIL) 25 MG tablet Take 1 tablet (25 mg total) by mouth daily. 90 tablet 3  . HYDROcodone-acetaminophen (NORCO/VICODIN) 5-325 MG tablet Take 1 tablet by mouth every 6 (six) hours as needed for  moderate pain. 10 tablet 0  . ketoconazole (NIZORAL) 2 % cream Apply 1 fingertip amount to each foot daily. (Patient taking differently: Apply 1 application topically daily as needed (affected area of foot). Apply 1 fingertip amount to each foot daily.) 30 g 0  . lansoprazole (PREVACID) 15 MG capsule TAKE 1 CAPSULE DAILY BEFOREBREAKFAST (NEED TO MAKE AN OFFICE VISIT FOR FURTHER   REFILLS) (Patient taking differently: Take 15 mg by mouth daily. ) 90 capsule 1  . lidocaine-prilocaine (EMLA) cream Apply 1 application topically as needed. 30 g 0  . lidocaine-prilocaine (EMLA) cream Apply to affected area once  30 g 3  . loratadine (CLARITIN) 10 MG tablet Take 10 mg by mouth daily as needed for allergies.    . metFORMIN (GLUCOPHAGE-XR) 500 MG 24 hr tablet Take 1 tablet (500 mg total) by mouth 2 (two) times daily before a meal. 180 tablet 1  . metoprolol succinate (TOPROL-XL) 100 MG 24 hr tablet Take 1 tablet (100 mg total) by mouth daily. Take with or immediately following a meal. 90 tablet 1  . Multiple Vitamins-Minerals (CENTRUM SILVER ULTRA WOMENS) TABS Take 1 tablet by mouth daily.     . Omega 3 1000 MG CAPS Take 2,000 mg by mouth daily.     . ondansetron (ZOFRAN) 8 MG tablet Take 1 tablet (8 mg total) by mouth every 8 (eight) hours as needed for nausea or vomiting. 30 tablet 0  . prochlorperazine (COMPAZINE) 10 MG tablet Take 1 tablet (10 mg total) by mouth every 6 (six) hours as needed for nausea or vomiting. 30 tablet 0  . raloxifene (EVISTA) 60 MG tablet TAKE 1 TABLET DAILY 90 tablet 0  . rosuvastatin (CRESTOR) 20 MG tablet Take 1 tablet (20 mg total) by mouth daily. 90 tablet 1  . sitaGLIPtin (JANUVIA) 100 MG tablet Take 1 tablet (100 mg total) by mouth daily. 90 tablet 4  . triamcinolone cream (KENALOG) 0.1 % Apply 1 application topically 2 (two) times daily. (Patient taking differently: Apply 1 application topically 2 (two) times daily as needed (rash). ) 30 g 0  . valsartan (DIOVAN) 320 MG tablet Take 1 tablet (320 mg total) by mouth daily. 90 tablet 3   Current Facility-Administered Medications  Medication Dose Route Frequency Provider Last Rate Last Admin  . triamcinolone acetonide (KENALOG) 10 MG/ML injection 10 mg  10 mg Other Once Harriet Masson, DPM       Facility-Administered Medications Ordered in Other Visits  Medication Dose Route Frequency Provider Last Rate Last Admin  . dextrose 5 % solution   Intravenous Once [name], MD      . gemcitabine [facility]) 1,748 mg in sodium chloride 0.9 % 250 mL chemo infusion  1,000 mg/m2 (Treatment Plan Recorded) Intravenous Once [name], MD  592 mL/hr at 03/08/19 1132 1,748 mg at 03/08/19 1132  . heparin lock flush 100 unit/mL  500 Units Intracatheter Once PRN [name], MD      . oxaliplatin (ELOXATIN) 125 mg in dextrose 5 % 500 mL chemo infusion  70 mg/m2 (Treatment Plan Recorded) Intravenous Once [name], MD      . sodium chloride flush (NS) 0.9 % injection 10 mL  10 mL Intracatheter PRN [name], MD        PHYSICAL EXAMINATION: ECOG PERFORMANCE STATUS: 1 - Symptomatic but completely ambulatory  Vitals:   03/08/19 0941  BP: (!) 166/70  Pulse: 70  Resp: 17  Temp: 98.2 F (36.8 C)  SpO2: 100%  Filed Weights   03/08/19 0941  Weight: 146 lb 1.6 oz (66.3 kg)    GENERAL:alert, no distress and comfortable SKIN: palms dry with mild hyperpigmentation  EYES: sclera clear NECK: without mass LUNGS: clear with normal breathing effort HEART: regular rate & rhythm, no lower extremity edema ABDOMEN:abdomen soft, non-tender and normal bowel sounds NEURO: alert & oriented x 3 with fluent speech, no focal motor/sensory deficits PAC without erythema   LABORATORY DATA:  I have reviewed the data as listed CBC Latest Ref Rng & Units 03/08/2019 02/22/2019 02/07/2019  WBC 4.0 - 10.5 K/uL 4.4 3.7(L) 3.9(L)  Hemoglobin 12.0 - 15.0 g/dL 8.1(L) 7.9(L) 8.6(L)  Hematocrit 36.0 - 46.0 % 26.1(L) 25.7(L) 27.7(L)  Platelets 150 - 400 K/uL 124(L) 175 100(L)     CMP Latest Ref Rng & Units 03/08/2019 02/22/2019 02/07/2019  Glucose 70 - 99 mg/dL 140(H) 144(H) 96  BUN 8 - 23 mg/dL '18 15 13  '$ Creatinine 0.44 - 1.00 mg/dL 1.10(H) 1.17(H) 1.17(H)  Sodium 135 - 145 mmol/L 144 145 144  Potassium 3.5 - 5.1 mmol/L 3.8 3.9 4.1  Chloride 98 - 111 mmol/L 108 110 109  CO2 22 - 32 mmol/L '27 27 26  '$ Calcium 8.9 - 10.3 mg/dL 8.6(L) 8.5(L) 9.0  Total Protein 6.5 - 8.1 g/dL 6.8 6.6 6.9  Total Bilirubin 0.3 - 1.2 mg/dL 0.2(L) <0.2(L) 0.3  Alkaline Phos 38 - 126 U/L 63 64 62  AST 15 - 41 U/L 39 36 47(H)  ALT 0 - 44 U/L '14 16 19      '$ RADIOGRAPHIC  STUDIES: I have personally reviewed the radiological images as listed and agreed with the findings in the report. No results found.   ASSESSMENT & PLAN: Donna May a 78 y.o.femalewith   1.Intrahepatic cholangiocarcinoma, cT1N0M1,with peritoneal metastasis, MSS, IDH1 mutation (+) -Diagnosed in 09/2018 biopsy of her liver mass shows adenocarcinoma,most consistent withcholangiocarcinoma -09/29/18 PET scanshowedno evidence of distant metastasis. -Unfortunately she was found to have peritoneal metastasis to right diaphragm and surgery was aborted. -HerCT AP from 9/15/20shows dominate liver mass stable to mild increase, mild nodularity of both adrenal glands which warrants being monitored.No visible peritoneal metastasis on CT scan. -Given her metastatic cancer and ineligibility forsurgery,she began first-line systemic chemotherapy for disease control.Due to her CKD, she is not a candidate for cisplatin.  -FO resultsshowed MSI stable disease, IDH mutation positive.Consider IDH 1 inhibitor in the future.Not a candidate for immunotherapy -She began oxaliplatin and gemcitabine every 2 weeks on 11/30/2018 -CT from 01/20/19 showed overall stable disease. She continues first line treatment -Ms. Herne appears stable. She completed 7 cycles GEMOX, she tolerates well overall. She has intermittent cold sensitivity and early signs of neuropathy in her feet.  -labs reviewed, she has persistent anemia and intermittent thrombocytopenia. Hgb 8.1 today, she is asymptomatic. Will hold RBC transfusion for now. She knows to call if she has worsening fatigue, dyspnea, headache, dizziness, etc. PLT 124K. CMP stable. Overall labs adequate for treatment -She will proceed with cycle 8 GEMOX today, no further dose reductions -she was given a letter today to extend time off from work -She will undergo restaging PET scan on 03/20/18, f/u on 1/7  2. H/o right breast cancer, Geneticsnegative -s/p  mastectomy in 1992, per patient did not require adjuvant therapy  -followed by Dr. Jana Hakim -VUS of gene APC;genetics otherwise normal  3.Comorbidities:DM, HTN, hiatal hernia, GERD, renal artery stenosis -Follow-up with PCP -Crfluctuates, 1.10today; overall much improved from July - Sept this year. -f/u with nephrology  -  I encouraged her to hydrate and avoid nephrotoxic agents.  4. Family support  -she has a son and daughter who are supportive.   5.Goal of care discussion  -We frequently discuss the goal of her treatment is palliative and that her cancer is not curable with chemo alone at this stage. The goal is to control her disease, prolong her life, and give her good quality of life. She appears to understand  -she is full code now  6. Hypoglycemic event, 12/2018 -She fell at the store, attributes to hypoglycemia but did not check sugar at the time.  -Only takes metformin once daily.  -F/u with Dr. Dwyane Dee.  -BG 140 today   PLAN: -Labs reviewed -Goals of care discussion -Proceed with cycle 8 GEMOX today, no further dose reductions -PET 03/20/18 -F/u and treatment in 2 weeks  -work excuse letter given to patient   All questions were answered. The patient knows to call the clinic with any problems, questions or concerns. No barriers to learning was detected. I spent 20 minutes counseling the patient face to face. The total time spent in the appointment was 25 minutes and more than 50% was on counseling and review of test results     Alla Feeling, NP 03/08/19

## 2019-03-08 ENCOUNTER — Inpatient Hospital Stay (HOSPITAL_BASED_OUTPATIENT_CLINIC_OR_DEPARTMENT_OTHER): Payer: Medicare Other | Admitting: Nurse Practitioner

## 2019-03-08 ENCOUNTER — Other Ambulatory Visit: Payer: Self-pay

## 2019-03-08 ENCOUNTER — Encounter: Payer: Self-pay | Admitting: Nurse Practitioner

## 2019-03-08 ENCOUNTER — Inpatient Hospital Stay: Payer: Medicare Other

## 2019-03-08 VITALS — BP 166/70 | HR 70 | Temp 98.2°F | Resp 17 | Ht 65.0 in | Wt 146.1 lb

## 2019-03-08 DIAGNOSIS — Z95828 Presence of other vascular implants and grafts: Secondary | ICD-10-CM

## 2019-03-08 DIAGNOSIS — C221 Intrahepatic bile duct carcinoma: Secondary | ICD-10-CM

## 2019-03-08 DIAGNOSIS — Z5111 Encounter for antineoplastic chemotherapy: Secondary | ICD-10-CM | POA: Diagnosis not present

## 2019-03-08 LAB — CMP (CANCER CENTER ONLY)
ALT: 14 U/L (ref 0–44)
AST: 39 U/L (ref 15–41)
Albumin: 3.3 g/dL — ABNORMAL LOW (ref 3.5–5.0)
Alkaline Phosphatase: 63 U/L (ref 38–126)
Anion gap: 9 (ref 5–15)
BUN: 18 mg/dL (ref 8–23)
CO2: 27 mmol/L (ref 22–32)
Calcium: 8.6 mg/dL — ABNORMAL LOW (ref 8.9–10.3)
Chloride: 108 mmol/L (ref 98–111)
Creatinine: 1.1 mg/dL — ABNORMAL HIGH (ref 0.44–1.00)
GFR, Est AFR Am: 55 mL/min — ABNORMAL LOW (ref >60)
GFR, Estimated: 47 mL/min — ABNORMAL LOW (ref >60)
Glucose, Bld: 140 mg/dL — ABNORMAL HIGH (ref 70–99)
Potassium: 3.8 mmol/L (ref 3.5–5.1)
Sodium: 144 mmol/L (ref 135–145)
Total Bilirubin: 0.2 mg/dL — ABNORMAL LOW (ref 0.3–1.2)
Total Protein: 6.8 g/dL (ref 6.5–8.1)

## 2019-03-08 LAB — CBC WITH DIFFERENTIAL (CANCER CENTER ONLY)
Abs Immature Granulocytes: 0.02 10*3/uL (ref 0.00–0.07)
Basophils Absolute: 0 10*3/uL (ref 0.0–0.1)
Basophils Relative: 0 %
Eosinophils Absolute: 0 10*3/uL (ref 0.0–0.5)
Eosinophils Relative: 1 %
HCT: 26.1 % — ABNORMAL LOW (ref 36.0–46.0)
Hemoglobin: 8.1 g/dL — ABNORMAL LOW (ref 12.0–15.0)
Immature Granulocytes: 1 %
Lymphocytes Relative: 19 %
Lymphs Abs: 0.8 10*3/uL (ref 0.7–4.0)
MCH: 29.7 pg (ref 26.0–34.0)
MCHC: 31 g/dL (ref 30.0–36.0)
MCV: 95.6 fL (ref 80.0–100.0)
Monocytes Absolute: 1.2 10*3/uL — ABNORMAL HIGH (ref 0.1–1.0)
Monocytes Relative: 27 %
Neutro Abs: 2.3 10*3/uL (ref 1.7–7.7)
Neutrophils Relative %: 52 %
Platelet Count: 124 10*3/uL — ABNORMAL LOW (ref 150–400)
RBC: 2.73 MIL/uL — ABNORMAL LOW (ref 3.87–5.11)
RDW: 20.4 % — ABNORMAL HIGH (ref 11.5–15.5)
WBC Count: 4.4 10*3/uL (ref 4.0–10.5)
nRBC: 0 % (ref 0.0–0.2)

## 2019-03-08 LAB — SAMPLE TO BLOOD BANK

## 2019-03-08 MED ORDER — PALONOSETRON HCL INJECTION 0.25 MG/5ML
0.2500 mg | Freq: Once | INTRAVENOUS | Status: AC
  Administered 2019-03-08: 0.25 mg via INTRAVENOUS

## 2019-03-08 MED ORDER — SODIUM CHLORIDE 0.9% FLUSH
10.0000 mL | INTRAVENOUS | Status: DC | PRN
  Administered 2019-03-08: 10 mL
  Filled 2019-03-08: qty 10

## 2019-03-08 MED ORDER — SODIUM CHLORIDE 0.9 % IV SOLN
Freq: Once | INTRAVENOUS | Status: AC
  Filled 2019-03-08: qty 250

## 2019-03-08 MED ORDER — SODIUM CHLORIDE 0.9 % IV SOLN
1000.0000 mg/m2 | Freq: Once | INTRAVENOUS | Status: AC
  Administered 2019-03-08: 1748 mg via INTRAVENOUS
  Filled 2019-03-08: qty 45.97

## 2019-03-08 MED ORDER — DEXAMETHASONE SODIUM PHOSPHATE 10 MG/ML IJ SOLN
10.0000 mg | Freq: Once | INTRAMUSCULAR | Status: AC
  Administered 2019-03-08: 11:00:00 10 mg via INTRAVENOUS

## 2019-03-08 MED ORDER — DEXTROSE 5 % IV SOLN
Freq: Once | INTRAVENOUS | Status: AC
  Filled 2019-03-08: qty 250

## 2019-03-08 MED ORDER — HEPARIN SOD (PORK) LOCK FLUSH 100 UNIT/ML IV SOLN
500.0000 [IU] | Freq: Once | INTRAVENOUS | Status: AC | PRN
  Administered 2019-03-08: 500 [IU]
  Filled 2019-03-08: qty 5

## 2019-03-08 MED ORDER — OXALIPLATIN CHEMO INJECTION 100 MG/20ML
70.0000 mg/m2 | Freq: Once | INTRAVENOUS | Status: AC
  Administered 2019-03-08: 125 mg via INTRAVENOUS
  Filled 2019-03-08: qty 20

## 2019-03-08 MED ORDER — DEXAMETHASONE SODIUM PHOSPHATE 10 MG/ML IJ SOLN
INTRAMUSCULAR | Status: AC
  Filled 2019-03-08: qty 1

## 2019-03-08 MED ORDER — PALONOSETRON HCL INJECTION 0.25 MG/5ML
INTRAVENOUS | Status: AC
  Filled 2019-03-08: qty 5

## 2019-03-08 MED ORDER — SODIUM CHLORIDE 0.9% FLUSH
10.0000 mL | Freq: Once | INTRAVENOUS | Status: AC
  Administered 2019-03-08: 10 mL
  Filled 2019-03-08: qty 10

## 2019-03-08 NOTE — Patient Instructions (Signed)
[facility] Discharge Instructions for Patients Receiving Chemotherapy  Today you received the following chemotherapy agents: Gemcitabine (Gemzar) and Oxaliplatin (Eloxatin)  To help prevent nausea and vomiting after your treatment, we encourage you to take your nausea medication as directed.   If you develop nausea and vomiting that is not controlled by your nausea medication, call the clinic.   BELOW ARE SYMPTOMS THAT SHOULD BE REPORTED IMMEDIATELY:  *FEVER GREATER THAN 100.5 F  *CHILLS WITH OR WITHOUT FEVER  NAUSEA AND VOMITING THAT IS NOT CONTROLLED WITH YOUR NAUSEA MEDICATION  *UNUSUAL SHORTNESS OF BREATH  *UNUSUAL BRUISING OR BLEEDING  TENDERNESS IN MOUTH AND THROAT WITH OR WITHOUT PRESENCE OF ULCERS  *URINARY PROBLEMS  *BOWEL PROBLEMS  UNUSUAL RASH Items with * indicate a potential emergency and should be followed up as soon as possible.  Feel free to call the clinic should you have any questions or concerns. The clinic phone number is [phone].  Please show the [facility] at check-in to the Emergency Department and triage nurse.  Coronavirus (COVID-19) Are you at risk?  Are you at risk for the Coronavirus (COVID-19)?  To be considered HIGH RISK for Coronavirus (COVID-19), you have to meet the following criteria:  . Traveled to [place], [place], [place], [place] or [place]; or in the [place] to [place], [place], [place], or [place]; and have fever, cough, and shortness of breath within the last 2 weeks of travel OR . Been in close contact with a person diagnosed with COVID-19 within the last 2 weeks and have fever, cough, and shortness of breath . IF YOU DO NOT MEET THESE CRITERIA, YOU ARE CONSIDERED LOW RISK FOR COVID-19.  What to do if you are HIGH RISK for COVID-19?  Marland Kitchen If you are having a medical emergency, call 911. . Seek medical care right away. Before you go to a doctor's office, urgent care or emergency department,  call ahead and tell them about your recent travel, contact with someone diagnosed with COVID-19, and your symptoms. You should receive instructions from your physician's office regarding next steps of care.  . When you arrive at healthcare provider, tell the healthcare staff immediately you have returned from visiting [place], [place], [place], [place] or [place]; or traveled in the [place] to [place], [place], [place], or [place]; in the last two weeks or you have been in close contact with a person diagnosed with COVID-19 in the last 2 weeks.   . Tell the health care staff about your symptoms: fever, cough and shortness of breath. . After you have been seen by a medical provider, you will be either: o Tested for (COVID-19) and discharged home on quarantine except to seek medical care if symptoms worsen, and asked to  - Stay home and avoid contact with others until you get your results (4-5 days)  - Avoid travel on public transportation if possible (such as bus, train, or airplane) or o Sent to the Emergency Department by EMS for evaluation, COVID-19 testing, and possible admission depending on your condition and test results.  What to do if you are LOW RISK for COVID-19?  Reduce your risk of any infection by using the same precautions used for avoiding the common cold or flu:  Marland Kitchen Wash your hands often with soap and warm water for at least 20 seconds.  If soap and water are not readily available, use an alcohol-based hand sanitizer with at least 60% alcohol.  Marland Kitchen  If coughing or sneezing, cover your mouth and nose by coughing or sneezing into the elbow areas of your shirt or coat, into a tissue or into your sleeve (not your hands). . Avoid shaking hands with others and consider head nods or verbal greetings only. . Avoid touching your eyes, nose, or mouth with unwashed hands.  . Avoid close contact with people who are sick. . Avoid places or events with large numbers of people in one  location, like concerts or sporting events. . Carefully consider travel plans you have or are making. . If you are planning any travel outside or inside the [place], visit the CDC's Travelers' Health webpage for the latest health notices. . If you have some symptoms but not all symptoms, continue to monitor at home and seek medical attention if your symptoms worsen. . If you are having a medical emergency, call 911.   ADDITIONAL HEALTHCARE OPTIONS FOR PATIENTS  [facility] / e-Visit: [email or web address]         [facility]: [phone]  [facility]: [phone]                   [facility]: [phone]   

## 2019-03-09 ENCOUNTER — Telehealth: Payer: Self-pay | Admitting: Nurse Practitioner

## 2019-03-09 NOTE — Telephone Encounter (Signed)
Scheduled appt per 12/24 los.  Patient will get a print at their next scheduled appt.

## 2019-03-21 ENCOUNTER — Inpatient Hospital Stay: Payer: Medicare Other

## 2019-03-21 ENCOUNTER — Ambulatory Visit (HOSPITAL_COMMUNITY)
Admission: RE | Admit: 2019-03-21 | Discharge: 2019-03-21 | Disposition: A | Discharge status: Home / Self Care | Payer: Medicare Other | Source: Ambulatory Visit | Attending: Hematology | Admitting: Hematology

## 2019-03-21 ENCOUNTER — Other Ambulatory Visit: Payer: Self-pay | Admitting: Hematology

## 2019-03-21 ENCOUNTER — Other Ambulatory Visit: Payer: Self-pay

## 2019-03-21 ENCOUNTER — Telehealth: Payer: Self-pay | Admitting: Hematology

## 2019-03-21 ENCOUNTER — Telehealth: Payer: Self-pay | Admitting: Nurse Practitioner

## 2019-03-21 ENCOUNTER — Inpatient Hospital Stay: Payer: Medicare Other | Attending: Adult Health

## 2019-03-21 DIAGNOSIS — Z95828 Presence of other vascular implants and grafts: Secondary | ICD-10-CM

## 2019-03-21 DIAGNOSIS — Z853 Personal history of malignant neoplasm of breast: Secondary | ICD-10-CM | POA: Insufficient documentation

## 2019-03-21 DIAGNOSIS — Z9011 Acquired absence of right breast and nipple: Secondary | ICD-10-CM | POA: Diagnosis not present

## 2019-03-21 DIAGNOSIS — C786 Secondary malignant neoplasm of retroperitoneum and peritoneum: Secondary | ICD-10-CM | POA: Insufficient documentation

## 2019-03-21 DIAGNOSIS — C221 Intrahepatic bile duct carcinoma: Secondary | ICD-10-CM | POA: Insufficient documentation

## 2019-03-21 DIAGNOSIS — D6481 Anemia due to antineoplastic chemotherapy: Secondary | ICD-10-CM | POA: Insufficient documentation

## 2019-03-21 DIAGNOSIS — Z5111 Encounter for antineoplastic chemotherapy: Secondary | ICD-10-CM | POA: Insufficient documentation

## 2019-03-21 DIAGNOSIS — Z79899 Other long term (current) drug therapy: Secondary | ICD-10-CM | POA: Diagnosis not present

## 2019-03-21 LAB — CBC WITH DIFFERENTIAL (CANCER CENTER ONLY)
Abs Immature Granulocytes: 0.02 10*3/uL (ref 0.00–0.07)
Basophils Absolute: 0 10*3/uL (ref 0.0–0.1)
Basophils Relative: 0 %
Eosinophils Absolute: 0.1 10*3/uL (ref 0.0–0.5)
Eosinophils Relative: 1 %
HCT: 24.4 % — ABNORMAL LOW (ref 36.0–46.0)
Hemoglobin: 7.7 g/dL — ABNORMAL LOW (ref 12.0–15.0)
Immature Granulocytes: 0 %
Lymphocytes Relative: 17 %
Lymphs Abs: 0.9 10*3/uL (ref 0.7–4.0)
MCH: 29.3 pg (ref 26.0–34.0)
MCHC: 31.6 g/dL (ref 30.0–36.0)
MCV: 92.8 fL (ref 80.0–100.0)
Monocytes Absolute: 1 10*3/uL (ref 0.1–1.0)
Monocytes Relative: 20 %
Neutro Abs: 3.3 10*3/uL (ref 1.7–7.7)
Neutrophils Relative %: 62 %
Platelet Count: 101 10*3/uL — ABNORMAL LOW (ref 150–400)
RBC: 2.63 MIL/uL — ABNORMAL LOW (ref 3.87–5.11)
RDW: 20 % — ABNORMAL HIGH (ref 11.5–15.5)
WBC Count: 5.3 10*3/uL (ref 4.0–10.5)
nRBC: 0 % (ref 0.0–0.2)

## 2019-03-21 LAB — CMP (CANCER CENTER ONLY)
ALT: 17 U/L (ref 0–44)
AST: 42 U/L — ABNORMAL HIGH (ref 15–41)
Albumin: 3.3 g/dL — ABNORMAL LOW (ref 3.5–5.0)
Alkaline Phosphatase: 52 U/L (ref 38–126)
Anion gap: 8 (ref 5–15)
BUN: 17 mg/dL (ref 8–23)
CO2: 26 mmol/L (ref 22–32)
Calcium: 9.1 mg/dL (ref 8.9–10.3)
Chloride: 107 mmol/L (ref 98–111)
Creatinine: 1.04 mg/dL — ABNORMAL HIGH (ref 0.44–1.00)
GFR, Est AFR Am: 59 mL/min — ABNORMAL LOW (ref >60)
GFR, Estimated: 51 mL/min — ABNORMAL LOW (ref >60)
Glucose, Bld: 118 mg/dL — ABNORMAL HIGH (ref 70–99)
Potassium: 3.6 mmol/L (ref 3.5–5.1)
Sodium: 141 mmol/L (ref 135–145)
Total Bilirubin: 0.7 mg/dL (ref 0.3–1.2)
Total Protein: 6.5 g/dL (ref 6.5–8.1)

## 2019-03-21 LAB — GLUCOSE, CAPILLARY: Glucose-Capillary: 97 mg/dL (ref 70–99)

## 2019-03-21 MED ORDER — FLUDEOXYGLUCOSE F - 18 (FDG) INJECTION
7.3000 | Freq: Once | INTRAVENOUS | Status: AC | PRN
  Administered 2019-03-21: 7.3 via INTRAVENOUS

## 2019-03-21 MED ORDER — SODIUM CHLORIDE 0.9% FLUSH
10.0000 mL | Freq: Once | INTRAVENOUS | Status: AC
  Administered 2019-03-21: 10 mL
  Filled 2019-03-21: qty 10

## 2019-03-21 MED ORDER — HEPARIN SOD (PORK) LOCK FLUSH 100 UNIT/ML IV SOLN
500.0000 [IU] | Freq: Once | INTRAVENOUS | Status: AC
  Administered 2019-03-21: 500 [IU]
  Filled 2019-03-21: qty 5

## 2019-03-21 NOTE — Telephone Encounter (Signed)
Added labs to 1/7 appts - per 1/5 sch message- unable to reach pt . Left message with appt date and time

## 2019-03-21 NOTE — Telephone Encounter (Signed)
I called patient to check on her, as BG in radiology recorded at 39 this morning when she came in for PET scan. She states the tech used blood from her port a cath in the meter, when it resulted 39 he did a finger stick was was 97. Blood glucose on CMP this morning is 118. She denies symptoms of hypoglycemia. She does not check BG at home. She is on [place] and metformin but has not taken metformin in a week or so because she is skeptical. I recommend she get home glucometer supplies to check at home. She will discuss tomorrow at appt with PCP.   Also I discussed her CBC shows Hg 7.7. She is asymptomatic except very mild exertional dyspnea; denying more fatigue, headache, dizziness, etc. She is due for chemo on 1/7 and I discussed her anemia will likely get worse after treatment. She agrees to [facility] transfusion if needed. Will add on lab for T/C and additional chair time on 1/7 if possible and she can finalize the plan in f/u with Dr. Burr Medico on 1/7. She agrees and appreciates the call.   Cira Rue, NP  03/21/2019

## 2019-03-21 NOTE — Patient Instructions (Signed)

## 2019-03-22 ENCOUNTER — Telehealth: Payer: Self-pay | Admitting: Family Medicine

## 2019-03-22 ENCOUNTER — Other Ambulatory Visit: Payer: Self-pay

## 2019-03-22 ENCOUNTER — Ambulatory Visit (INDEPENDENT_AMBULATORY_CARE_PROVIDER_SITE_OTHER): Payer: Medicare Other | Admitting: Family Medicine

## 2019-03-22 ENCOUNTER — Encounter: Payer: Self-pay | Admitting: Family Medicine

## 2019-03-22 VITALS — BP 152/68 | HR 78 | Temp 97.8°F | Wt 147.0 lb

## 2019-03-22 DIAGNOSIS — I10 Essential (primary) hypertension: Secondary | ICD-10-CM

## 2019-03-22 DIAGNOSIS — E1121 Type 2 diabetes mellitus with diabetic nephropathy: Secondary | ICD-10-CM

## 2019-03-22 DIAGNOSIS — C221 Intrahepatic bile duct carcinoma: Secondary | ICD-10-CM | POA: Diagnosis not present

## 2019-03-22 DIAGNOSIS — N1831 Chronic kidney disease, stage 3a: Secondary | ICD-10-CM

## 2019-03-22 DIAGNOSIS — E1122 Type 2 diabetes mellitus with diabetic chronic kidney disease: Secondary | ICD-10-CM

## 2019-03-22 LAB — POCT GLYCOSYLATED HEMOGLOBIN (HGB A1C): Hemoglobin A1C: 5.7 % — AB (ref 4.0–5.6)

## 2019-03-22 LAB — POCT GLUCOSE (DEVICE FOR HOME USE): Glucose Fasting, POC: 110 mg/dL — AB (ref 70–99)

## 2019-03-22 NOTE — Progress Notes (Signed)
Subjective:    Patient ID: Donna May, female    DOB: 1941/01/20, 79 y.o.   MRN: [id number]  No chief complaint on file.   HPI Pt is an 79 yo female with pmh sig for renal artery stenosis, HTN, intrahepatic cholangiocarcinoma, GERD, h/o hiatal hernia, DM II, CKD III, h/o R breast cancer s/p mastectomy who was seen today for follow-up.  Pt states she has good days and bad.  Followed by Dr. Burr Medico, receiving chemo (Oxaliplatin and gemcitabine) q 2 wks, for cholangiocarcinoma.  Had a PET scan yesterday and f/u tomorrow.  Typically feels fatigued 2 days after chemo.  States may have to get a transfusion later this wk.  Trying to eat iron rich foods and has a iron/B12 supplement. Followed by Dr. Dwyane Dee for DM II.  States would like to get off meds if possible.  Having difficulty using the Verio one touch meter.  States medicare sent her a meter that she may start using.   BP has been elevated at several appts.  Pt notes she does not always take her medications.  Pt notes some dizziness and neuropathy in feet.  Drinking more water.  Seen by nephrology for CKD 3.  Past Medical History:  Diagnosis Date  . Anemia   . Anxiety   . Arthritis   . Breast cancer ([place]) 1992  . Cataract    Bilateral eyes - surgery to remove  . Chronic kidney disease    CKD stage 3  . Complication of anesthesia    "something they use make me itch for a couple of days."  . Depression   . Diabetes mellitus    type 2  . Duodenitis 01/18/2002  . Fainting spell   . Family history of breast cancer   . Family history of prostate cancer   . GERD (gastroesophageal reflux disease)   . Heart murmur    never has caused any problems  . Hiatal hernia 08/08/2008, 01/18/2002  . History of pneumonia    x 2  . Hyperlipidemia   . Hypertension   . Liver cancer ([place]) 08/2018  . Lymphedema 2017   Right arm  . Pneumonia    x 2  . UTI (lower urinary tract infection)     No Known Allergies  ROS General: Denies fever, chills, night  sweats, changes in weight   +changes in appetite HEENT: Denies headaches, ear pain, changes in vision, rhinorrhea, sore throat CV: Denies CP, palpitations, SOB, orthopnea Pulm: Denies SOB, cough, wheezing GI: Denies abdominal pain, nausea, vomiting, diarrhea, constipation GU: Denies dysuria, hematuria, frequency, vaginal discharge Msk: Denies muscle cramps, joint pains Neuro: Denies weakness, numbness, tingling Skin: Denies rashes, bruising  +discoloration of fingers Psych: Denies depression, anxiety, hallucinations     Objective:    Blood pressure (!) 152/68, pulse 78, temperature 97.8 F (36.6 C), temperature source Temporal, weight 147 lb (66.7 kg).  Gen. Pleasant, well-nourished, in no distress, normal affect   HEENT: [place]/AT, face symmetric, no scleral icterus, PERRLA, EOMI, nares patent without drainage Lungs: no accessory muscle use, CTAB, no wheezes or rales Cardiovascular: RRR, no m/r/g, no peripheral edema Abdomen: BS present, soft, NT/ND, no hepatosplenomegaly. Musculoskeletal: No deformities, no cyanosis or clubbing, normal tone Neuro:  A&Ox3, CN II-XII intact, normal gait Skin:  Warm, no lesions/ rash.  Hyperpigmentation of fingertips.   Wt Readings from Last 3 Encounters:  03/22/19 147 lb (66.7 kg)  03/08/19 146 lb 1.6 oz (66.3 kg)  02/22/19 151 lb 1.6 oz (68.5  kg)    Lab Results  Component Value Date   WBC 5.3 03/21/2019   HGB 7.7 (L) 03/21/2019   HCT 24.4 (L) 03/21/2019   PLT 101 (L) 03/21/2019   GLUCOSE 118 (H) 03/21/2019   CHOL 190 09/01/2018   TRIG 46.0 09/01/2018   HDL 60.50 09/01/2018   LDLCALC 120 (H) 09/01/2018   ALT 17 03/21/2019   AST 42 (H) 03/21/2019   NA 141 03/21/2019   K 3.6 03/21/2019   CL 107 03/21/2019   CREATININE 1.04 (H) 03/21/2019   BUN 17 03/21/2019   CO2 26 03/21/2019   TSH 1.48 04/26/2017   INR 1.1 11/09/2018   HGBA1C 6.4 (H) 11/09/2018   MICROALBUR 0.9 09/01/2018    Assessment/Plan:  Type 2 diabetes mellitus with stage  3a chronic kidney disease, without long-term current use of insulin (HCC)  -Hgb A1C 5.7% today -fsbs 110 while in office -continue current meds: januvia 100 mg, Metformin XR 500 mg BID -Attempted to view One touch verio readings in office, however needs new battery.  Pt advised to take glucometer to pharmacy for assistance with obtaining. - Plan: POCT Glucose (Device for Home Use), POCT glycosylated hemoglobin (Hb A1C)  Intrahepatic cholangiocarcinoma ([place]) -continue Chemo -managed by Oncology, Dr. Burr Medico -encouraged to keep f/u tomorrow to review PET scan results.  Essential hypertension -elevated -no changes made to meds as pt does not always take them. -will continue to monitor -discussed lifestyle modifications -pt advised to check bp at home  F/u in 3-4  months  Grier Mitts, MD

## 2019-03-22 NOTE — Patient Instructions (Addendum)
Managing Your Hypertension Hypertension is commonly called high blood pressure. This is when the force of your blood pressing against the walls of your arteries is too strong. Arteries are blood vessels that carry blood from your heart throughout your body. Hypertension forces the heart to work harder to pump blood, and may cause the arteries to become narrow or stiff. Having untreated or uncontrolled hypertension can cause heart attack, stroke, kidney disease, and other problems. What are blood pressure readings? A blood pressure reading consists of a higher number over a lower number. Ideally, your blood pressure should be below 120/80. The first ("top") number is called the systolic pressure. It is a measure of the pressure in your arteries as your heart beats. The second ("bottom") number is called the diastolic pressure. It is a measure of the pressure in your arteries as the heart relaxes. What does my blood pressure reading mean? Blood pressure is classified into four stages. Based on your blood pressure reading, your health care provider may use the following stages to determine what type of treatment you need, if any. Systolic pressure and diastolic pressure are measured in a unit called mm Hg. Normal  Systolic pressure: below 120.  Diastolic pressure: below 80. Elevated  Systolic pressure: 120-129.  Diastolic pressure: below 80. Hypertension stage 1  Systolic pressure: 130-139.  Diastolic pressure: 80-89. Hypertension stage 2  Systolic pressure: 140 or above.  Diastolic pressure: 90 or above. What health risks are associated with hypertension? Managing your hypertension is an important responsibility. Uncontrolled hypertension can lead to:  A heart attack.  A stroke.  A weakened blood vessel (aneurysm).  Heart failure.  Kidney damage.  Eye damage.  Metabolic syndrome.  Memory and concentration problems. What changes can I make to manage my  hypertension? Hypertension can be managed by making lifestyle changes and possibly by taking medicines. Your health care provider will help you make a plan to bring your blood pressure within a normal range. Eating and drinking   Eat a diet that is high in fiber and potassium, and low in salt (sodium), added sugar, and fat. An example eating plan is called the DASH (Dietary Approaches to Stop Hypertension) diet. To eat this way: ? Eat plenty of fresh fruits and vegetables. Try to fill half of your plate at each meal with fruits and vegetables. ? Eat whole grains, such as whole wheat pasta, brown rice, or whole grain bread. Fill about one quarter of your plate with whole grains. ? Eat low-fat diary products. ? Avoid fatty cuts of meat, processed or cured meats, and poultry with skin. Fill about one quarter of your plate with lean proteins such as fish, chicken without skin, beans, eggs, and tofu. ? Avoid premade and processed foods. These tend to be higher in sodium, added sugar, and fat.  Reduce your daily sodium intake. Most people with hypertension should eat less than 1,500 mg of sodium a day.  Limit alcohol intake to no more than 1 drink a day for nonpregnant women and 2 drinks a day for men. One drink equals 12 oz of beer, 5 oz of wine, or 1 oz of hard liquor. Lifestyle  Work with your health care provider to maintain a healthy body weight, or to lose weight. Ask what an ideal weight is for you.  Get at least 30 minutes of exercise that causes your heart to beat faster (aerobic exercise) most days of the week. Activities may include walking, swimming, or biking.  Include exercise   to strengthen your muscles (resistance exercise), such as weight lifting, as part of your weekly exercise routine. Try to do these types of exercises for 30 minutes at least 3 days a week.  Do not use any products that contain nicotine or tobacco, such as cigarettes and e-cigarettes. If you need help quitting,  ask your health care provider.  Control any long-term (chronic) conditions you have, such as high cholesterol or diabetes. Monitoring  Monitor your blood pressure at home as told by your health care provider. Your personal target blood pressure may vary depending on your medical conditions, your age, and other factors.  Have your blood pressure checked regularly, as often as told by your health care provider. Working with your health care provider  Review all the medicines you take with your health care provider because there may be side effects or interactions.  Talk with your health care provider about your diet, exercise habits, and other lifestyle factors that may be contributing to hypertension.  Visit your health care provider regularly. Your health care provider can help you create and adjust your plan for managing hypertension. Will I need medicine to control my blood pressure? Your health care provider may prescribe medicine if lifestyle changes are not enough to get your blood pressure under control, and if:  Your systolic blood pressure is 130 or higher.  Your diastolic blood pressure is 80 or higher. Take medicines only as told by your health care provider. Follow the directions carefully. Blood pressure medicines must be taken as prescribed. The medicine does not work as well when you skip doses. Skipping doses also puts you at risk for problems. Contact a health care provider if:  You think you are having a reaction to medicines you have taken.  You have repeated (recurrent) headaches.  You feel dizzy.  You have swelling in your ankles.  You have trouble with your vision. Get help right away if:  You develop a severe headache or confusion.  You have unusual weakness or numbness, or you feel faint.  You have severe pain in your chest or abdomen.  You vomit repeatedly.  You have trouble breathing. Summary  Hypertension is when the force of blood pumping  through your arteries is too strong. If this condition is not controlled, it may put you at risk for serious complications.  Your personal target blood pressure may vary depending on your medical conditions, your age, and other factors. For most people, a normal blood pressure is less than 120/80.  Hypertension is managed by lifestyle changes, medicines, or both. Lifestyle changes include weight loss, eating a healthy, low-sodium diet, exercising more, and limiting alcohol. This information is not intended to replace advice given to you by your health care provider. Make sure you discuss any questions you have with your health care provider. Document Revised: 06/24/2018 Document Reviewed: 01/29/2016 Elsevier Patient Education  [place].  Peripheral Neuropathy Peripheral neuropathy is a type of nerve damage. It affects nerves that carry signals between the spinal cord and the arms, legs, and the rest of the body (peripheral nerves). It does not affect nerves in the spinal cord or brain. In peripheral neuropathy, one nerve or a group of nerves may be damaged. Peripheral neuropathy is a broad category that includes many specific nerve disorders, like diabetic neuropathy, hereditary neuropathy, and carpal tunnel syndrome. What are the causes? This condition may be caused by:  Diabetes. This is the most common cause of peripheral neuropathy.  Nerve injury.  Pressure or stress on a nerve that lasts a long time.  Lack (deficiency) of B vitamins. This can result from alcoholism, poor diet, or a restricted diet.  Infections.  Autoimmune diseases, such as rheumatoid arthritis and systemic lupus erythematosus.  Nerve diseases that are passed from parent to child (inherited).  Some medicines, such as cancer medicines (chemotherapy).  Poisonous (toxic) substances, such as lead and mercury.  Too little blood flowing to the legs.  Kidney disease.  Thyroid disease. In some cases, the  cause of this condition is not known. What are the signs or symptoms? Symptoms of this condition depend on which of your nerves is damaged. Common symptoms include:  Loss of feeling (numbness) in the feet, hands, or both.  Tingling in the feet, hands, or both.  Burning pain.  Very sensitive skin.  Weakness.  Not being able to move a part of the body (paralysis).  Muscle twitching.  Clumsiness or poor coordination.  Loss of balance.  Not being able to control your bladder.  Feeling dizzy.  Sexual problems. How is this diagnosed? Diagnosing and finding the cause of peripheral neuropathy can be difficult. Your health care provider will take your medical history and do a physical exam. A neurological exam will also be done. This involves checking things that are affected by your brain, spinal cord, and nerves (nervous system). For example, your health care provider will check your reflexes, how you move, and what you can feel. You may have other tests, such as:  Blood tests.  Electromyogram (EMG) and nerve conduction tests. These tests check nerve function and how well the nerves are controlling the muscles.  Imaging tests, such as CT scans or MRI to rule out other causes of your symptoms.  Removing a small piece of nerve to be examined in a lab (nerve biopsy). This is rare.  Removing and examining a small amount of the fluid that surrounds the brain and spinal cord (lumbar puncture). This is rare. How is this treated? Treatment for this condition may involve:  Treating the underlying cause of the neuropathy, such as diabetes, kidney disease, or vitamin deficiencies.  Stopping medicines that can cause neuropathy, such as chemotherapy.  Medicine to relieve pain. Medicines may include: ? Prescription or over-the-counter pain medicine. ? Antiseizure medicine. ? Antidepressants. ? Pain-relieving patches that are applied to painful areas of skin.  Surgery to relieve  pressure on a nerve or to destroy a nerve that is causing pain.  Physical therapy to help improve movement and balance.  Devices to help you move around (assistive devices). Follow these instructions at home: Medicines  Take over-the-counter and prescription medicines only as told by your health care provider. Do not take any other medicines without first asking your health care provider.  Do not drive or use heavy machinery while taking prescription pain medicine. Lifestyle   Do not use any products that contain nicotine or tobacco, such as cigarettes and e-cigarettes. Smoking keeps blood from reaching damaged nerves. If you need help quitting, ask your health care provider.  Avoid or limit alcohol. Too much alcohol can cause a vitamin B deficiency, and vitamin B is needed for healthy nerves.  Eat a healthy diet. This includes: ? Eating foods that are high in fiber, such as fresh fruits and vegetables, whole grains, and beans. ? Limiting foods that are high in fat and processed sugars, such as fried or sweet foods. General instructions   If you have diabetes, work closely with your health  care provider to keep your blood sugar under control.  If you have numbness in your feet: ? Check every day for signs of injury or infection. Watch for redness, warmth, and swelling. ? Wear padded socks and comfortable shoes. These help protect your feet.  Develop a good support system. Living with peripheral neuropathy can be stressful. Consider talking with a mental health specialist or joining a support group.  Use assistive devices and attend physical therapy as told by your health care provider. This may include using a walker or a cane.  Keep all follow-up visits as told by your health care provider. This is important. Contact a health care provider if:  You have new signs or symptoms of peripheral neuropathy.  You are struggling emotionally from dealing with peripheral  neuropathy.  Your pain is not well-controlled. Get help right away if:  You have an injury or infection that is not healing normally.  You develop new weakness in an arm or leg.  You fall frequently. Summary  Peripheral neuropathy is when the nerves in the arms, or legs are damaged, resulting in numbness, weakness, or pain.  There are many causes of peripheral neuropathy, including diabetes, pinched nerves, vitamin deficiencies, autoimmune disease, and hereditary conditions.  Diagnosing and finding the cause of peripheral neuropathy can be difficult. Your health care provider will take your medical history, do a physical exam, and do tests, including blood tests and nerve function tests.  Treatment involves treating the underlying cause of the neuropathy and taking medicines to help control pain. Physical therapy and assistive devices may also help. This information is not intended to replace advice given to you by your health care provider. Make sure you discuss any questions you have with your health care provider. Document Revised: 02/12/2017 Document Reviewed: 05/11/2016 Elsevier Patient Education  2020 [employer].

## 2019-03-22 NOTE — Progress Notes (Signed)
Donna May   [phone] Fax:([phone]   Clinic Follow up Note   Patient Care Team: [name], MD as PCP - General (Family Medicine) [name], [name] Dad, MD as Consulting Physician (Oncology) [name], [name], MD as Consulting Physician (Cardiology) [name], MD as Consulting Physician (Obstetrics and Gynecology) [name], [name], NP as Nurse Practitioner (Hematology and Oncology) [facility] [name], MD as Consulting Physician (Hematology) [name], [name], MD as Consulting Physician (Gastroenterology) Virgina Evener, Dawn, RN (Inactive) as Oncology Nurse Navigator [name], MD as Consulting Physician (General Surgery)  Date of Service:  03/23/2019  CHIEF COMPLAINT: F/u ofcholangiocarcinomaof liver  SUMMARY OF ONCOLOGIC HISTORY: Oncology History  Malignant neoplasm of female breast ([place])  11/06/2018 Genetic Testing   Negative genetic testing on the [facility] Common Hereditary Cancers panel. A variant of uncertain significance was identified in one of her APC genes, called c.1243G>A (p.Ala415Thr).  The Common Hereditary Cancers Panel offered by [facility] includes sequencing and/or deletion duplication testing of the following 48 genes: APC, ATM, AXIN2, BARD1, BMPR1A, BRCA1, BRCA2, BRIP1, CDH1, CDK4, CDKN2A (p14ARF), CDKN2A (p16INK4a), CHEK2, CTNNA1, DICER1, EPCAM (Deletion/duplication testing only), GREM1 (promoter region deletion/duplication testing only), KIT, MEN1, MLH1, MSH2, MSH3, MSH6, MUTYH, NBN, NF1, NHTL1, PALB2, PDGFRA, PMS2, POLD1, POLE, PTEN, RAD50, RAD51C, RAD51D, RNF43, SDHB, SDHC, SDHD, SMAD4, SMARCA4. STK11, TP53, TSC1, TSC2, and VHL.  The following genes were evaluated for sequence changes only: SDHA and HOXB13 c.251G>A variant only.    11/30/2018 -  Chemotherapy   First line chemo oxaliplatin and gemcitabine q2weeks starting 11/30/18.    Intrahepatic cholangiocarcinoma ([place])  09/07/2018 Imaging    CT Chest IMPRESSION: 1. New, enhancing mass involving segment 4 of the liver and fundus of gallbladder is concerning for malignancy. This may represent either metastatic disease from breast cancer or neoplasm primary to the liver or hepatic biliary tree. Further evaluation with contrast enhanced CT of the abdomen and pelvis is recommended. 2. No findings to suggest metastatic disease within the chest. 3.  Aortic Atherosclerosis (ICD10-I70.0). 4. Coronary artery calcifications.   09/13/2018 Pathology Results   Diagnosis Liver, needle/core biopsy - ADENOCARCINOMA. Microscopic Comment Immunohistochemistry for CK7 is positive. CK20, TTF1, CDX-2, GATA-3, PAX 8, Qualitative ER, p63 and CK5/6 are negative. The provided clinical history of remote mammary carcinoma is noted. Based on the morphology and immunophenotype of the adenocarcinoma observed in this specimen, primary cholangiocarcinoma is favored. Clinical and radiologic correlation are  encouraged. Results reported to [employer] on 09/15/2018. Intradepartmental consultation (Dr. Vic Ripper).   09/13/2018 Initial Diagnosis   Cholangiocarcinoma ([place])   09/23/2018 Procedure   Colonoscopy by Dr. Havery Moros 09/23/18  IMPRESSION - Two 3 to 4 mm polyps in the ascending colon, removed with a cold snare. Resected and retrieved. - Five 3 to 5 mm polyps in the transverse colon, removed with a cold snare. Resected and retrieved. - One 5 mm polyp at the splenic flexure, removed with a cold snare. Resected and retrieved. - Three 3 to 5 mm polyps in the sigmoid colon, removed with a cold snare. Resected and retrieved. - The examination was otherwise normal. Upper Endopscy by Dr. Havery Moros 09/23/18  IMPRESSION - Esophagogastric landmarks identified. - 2 cm hiatal hernia. - Normal esophagus otherwise. - A single gastric polyp. Resected and retrieved. - Mild gastritis. Biopsied. - Normal duodenal bulb and second portion of the duodenum.   09/23/2018  Pathology Results   Diagnosis 09/23/18 1. Surgical [P], duodenum - BENIGN SMALL BOWEL MUCOSA. - NO ACTIVE INFLAMMATION  OR VILLOUS ATROPHY IDENTIFIED. 2. Surgical [P], stomach, polyp - HYPERPLASTIC POLYP(S). - THERE IS NO EVIDENCE OF MALIGNANCY. 3. Surgical [P], gastric antrum and gastric body - CHRONIC INACTIVE GASTRITIS. - THERE IS NO EVIDENCE OF HELICOBACTER-PYLORI, DYSPLASIA, OR MALIGNANCY. - SEE COMMENT. 4. Surgical [P], colon, sigmoid, splenic flexure, transverse and ascending, polyp (9) - TUBULAR ADENOMA(S). - SESSILE SERRATED POLYP WITHOUT CYTOLOGIC DYSPLASIA. - HIGH GRADE DYSPLASIA IS NOT IDENTIFIED. 5. Surgical [P], colon, sigmoid, polyp (2) - HYPERPLASTIC POLYP(S). - THERE IS NO EVIDENCE OF MALIGNANCY.   09/23/2018 Cancer Staging   Staging form: Intrahepatic Bile Duct, AJCC 8th Edition - Clinical stage from 09/23/2018: Stage IB (cT1b, cN0, cM0) - Signed by [name], MD on 10/06/2018   09/29/2018 PET scan   PET 09/29/18 IMPRESSION: 1. Hypermetabolic mass in the RIGHT hepatic lobe consistent with biopsy proven adenocarcinoma. No additional liver metastasis. 2. No evidence of local breast cancer recurrence in the RIGHT breast or RIGHT axilla. 3. Mild bilateral hypermetabolic adrenal glands is favored benign hyperplasia. 4. No evidence of additional metastatic disease on skull base to thigh FDG PET scan.   11/06/2018 Genetic Testing   Negative genetic testing on the [facility] Common Hereditary Cancers panel. A variant of uncertain significance was identified in one of her APC genes, called c.1243G>A (p.Ala415Thr).  The Common Hereditary Cancers Panel offered by [facility] includes sequencing and/or deletion duplication testing of the following 48 genes: APC, ATM, AXIN2, BARD1, BMPR1A, BRCA1, BRCA2, BRIP1, CDH1, CDK4, CDKN2A (p14ARF), CDKN2A (p16INK4a), CHEK2, CTNNA1, DICER1, EPCAM (Deletion/duplication testing only), GREM1 (promoter region deletion/duplication testing only), KIT,  MEN1, MLH1, MSH2, MSH3, MSH6, MUTYH, NBN, NF1, NHTL1, PALB2, PDGFRA, PMS2, POLD1, POLE, PTEN, RAD50, RAD51C, RAD51D, RNF43, SDHB, SDHC, SDHD, SMAD4, SMARCA4. STK11, TP53, TSC1, TSC2, and VHL.  The following genes were evaluated for sequence changes only: SDHA and HOXB13 c.251G>A variant only.    11/17/2018 Pathology Results   Diagnosis 11/17/18 1. Soft tissue, biopsy, Diaphragmatic nodules - METASTATIC ADENOCARCINOMA, CONSISTENT WITH PATIENT'S CLINICAL HISTORY OF CHOLANGIOCARCINOMA. SEE NOTE 2. Liver, biopsy, Left - LIVER PARENCHYMA WITH A BENIGN FIBROTIC NODULE - NO EVIDENCE OF MALIGNANCY 3. Stomach, biopsy - BENIGN PAPILLARY MESOTHELIAL HYPERPLASIA - NO EVIDENCE OF MALIGNANCY   11/29/2018 Imaging   CT CAP WO Contrast  IMPRESSION: 1. Dominant liver mass appears grossly stable from 09/29/2018. Additional liver lesions are too small to characterize but were not shown to be hypermetabolic on PET. 2. Mild nodularity of both adrenal glands with associated hypermetabolism on 09/29/2018. Continued attention on follow-up exams is warranted. 3. Small right lower lobe nodules, stable from 09/07/2018. Again, attention on follow-up is recommended. 4. Trace bilateral pleural fluid. 5. Aortic atherosclerosis (ICD10-170.0). Coronary artery calcification. 6. Enlarged pulmonic trunk, indicative of pulmonary arterial hypertension.     11/30/2018 -  Chemotherapy   First line chemo oxaliplatin and gemcitabine q2weeks starting 11/30/18.    01/20/2019 Imaging   CT CAP IMPRESSION: restaging  1. Mild interval increase in size of dominant liver mass involving segment 4 and segment 5. 2. No significant or progressive adrenal nodularity identified to suggest metastatic disease. 3. Unchanged appearance of small right lower lobe and lingular lung nodules, nonspecific.   03/21/2019 PET scan   IMPRESSION: 1. Large right hepatic mass with SUV uptake near background hepatic activity, dramatic response to  therapy, also with decrease in size when compared to the prior study. 2. Signs of prior right mastectomy and axillary dissection as before. 3. Left adrenal activity in no longer above the level of activity  seen in the contralateral, right adrenal gland or in the liver. Attention on follow-up. 4. No new signs of disease.      CURRENT THERAPY:  First line chemoOxaliplatinand gemcitabine q2weeksstarting 11/30/18.  INTERVAL HISTORY:  Donna May is here for a follow up and treatment. She presents to the clinic alone. She notes she is doing well. She denies issues with chemo. No nausea and only fatigue day 2-3. She is able to gain weight. She notes occasional tingling of her feet when walking and numbness of her feet when sitting. This does not bother her at night.    REVIEW OF SYSTEMS:   Constitutional: Denies fevers, chills or abnormal weight loss  Eyes: Denies blurriness of vision Ears, nose, mouth, throat, and face: Denies mucositis or sore throat Respiratory: Denies cough, dyspnea or wheezes Cardiovascular: Denies palpitation, chest discomfort or lower extremity swelling Gastrointestinal:  Denies nausea, heartburn or change in bowel habits Skin: Denies abnormal skin rashes Lymphatics: Denies new lymphadenopathy or easy bruising Neurological: (+) Neuropathy of her feet (+) Cold sensitivity in her hands  Behavioral/Psych: Mood is stable, no new changes  All other systems were reviewed with the patient and are negative.  MEDICAL HISTORY:  Past Medical History:  Diagnosis Date  . Anemia   . Anxiety   . Arthritis   . Breast cancer ([place]) 1992  . Cataract    Bilateral eyes - surgery to remove  . Chronic kidney disease    CKD stage 3  . Complication of anesthesia    "something they use make me itch for a couple of days."  . Depression   . Diabetes mellitus    type 2  . Duodenitis 01/18/2002  . Fainting spell   . Family history of breast cancer   . Family history of  prostate cancer   . GERD (gastroesophageal reflux disease)   . Heart murmur    never has caused any problems  . Hiatal hernia 08/08/2008, 01/18/2002  . History of pneumonia    x 2  . Hyperlipidemia   . Hypertension   . Liver cancer ([place]) 08/2018  . Lymphedema 2017   Right arm  . Pneumonia    x 2  . UTI (lower urinary tract infection)     SURGICAL HISTORY: Past Surgical History:  Procedure Laterality Date  . AXILLARY SURGERY     cyst removal, right  . COLONOSCOPY  09/23/2018   Dr. Havery Moros - polyps  . EYE SURGERY Bilateral    cataracts to remove  . LAPAROSCOPY N/A 11/17/2018   Procedure: LAPAROSCOPY DIAGNOSTIC, INTRAOPERATIVE ULTRASOUND, PERITONEAL BIOPSIES;  Surgeon: [name], MD;  Location: [facility];  Service: General;  Laterality: N/A;  GENERAL AND EPIDURAL  . LIVER BIOPSY  08/2018   Dr. Lindwood Coke  . MASTECTOMY  1992   right, with flap  . NM MYOCAR PERF WALL MOTION  06/11/2009   Protocol:[place], post stress EF58%, EKG negative for ischemia, low risk  . PORTACATH PLACEMENT N/A 12/02/2018   Procedure: INSERTION PORT-A-CATH;  Surgeon: [name], MD;  Location: [facility];  Service: General;  Laterality: N/A;  . RECONSTRUCTION BREAST W/ TRAM FLAP Right   . TONSILLECTOMY    . TRANSTHORACIC ECHOCARDIOGRAM  12/24/2009   LVEF =>55%, normal study  . UPPER GI ENDOSCOPY      I have reviewed the social history and family history with the patient and they are unchanged from previous note.  ALLERGIES:  has No Known Allergies.  MEDICATIONS:  Current Outpatient Medications  Medication Sig Dispense Refill  . amLODipine (NORVASC) 10 MG tablet TAKE 1 TABLET BY MOUTH EVERY DAY 90 tablet 3  . aspirin 81 MG tablet Take 81 mg by mouth at bedtime.     Marland Kitchen CALCIUM-MAGNESIUM-VITAMIN D PO Take 1 tablet by mouth once a week.     . Cholecalciferol (VITAMIN D3) 2000 UNITS TABS Take 1 tablet by mouth once a week.     . Echinacea 400 MG CAPS Take 400 mg by mouth daily.     Marland Kitchen Echinacea-Goldenseal  (ECHINACEA COMB/GOLDEN SEAL) CAPS Take by mouth.    Marland Kitchen glucose blood test strip 1 each by Other route 2 (two) times daily. Use Onetouch verio test strips as instructed to check blood sugar twice daily. 100 each 2  . hydrochlorothiazide (HYDRODIURIL) 25 MG tablet Take 1 tablet (25 mg total) by mouth daily. 90 tablet 3  . HYDROcodone-acetaminophen (NORCO/VICODIN) 5-325 MG tablet Take 1 tablet by mouth every 6 (six) hours as needed for moderate pain. 10 tablet 0  . ketoconazole (NIZORAL) 2 % cream Apply 1 fingertip amount to each foot daily. (Patient taking differently: Apply 1 application topically daily as needed (affected area of foot). Apply 1 fingertip amount to each foot daily.) 30 g 0  . lansoprazole (PREVACID) 15 MG capsule TAKE 1 CAPSULE DAILY BEFOREBREAKFAST (NEED TO MAKE AN OFFICE VISIT FOR FURTHER   REFILLS) (Patient taking differently: Take 15 mg by mouth daily. ) 90 capsule 1  . lidocaine-prilocaine (EMLA) cream Apply 1 application topically as needed. 30 g 0  . lidocaine-prilocaine (EMLA) cream Apply to affected area once 30 g 3  . loratadine (CLARITIN) 10 MG tablet Take 10 mg by mouth daily as needed for allergies.    . metFORMIN (GLUCOPHAGE-XR) 500 MG 24 hr tablet Take 1 tablet (500 mg total) by mouth 2 (two) times daily before a meal. 180 tablet 1  . metoprolol succinate (TOPROL-XL) 100 MG 24 hr tablet Take 1 tablet (100 mg total) by mouth daily. Take with or immediately following a meal. 90 tablet 1  . Omega 3 1000 MG CAPS Take 2,000 mg by mouth daily.     . ondansetron (ZOFRAN) 8 MG tablet Take 1 tablet (8 mg total) by mouth every 8 (eight) hours as needed for nausea or vomiting. 30 tablet 0  . Prenatal Vit-Fe Fumarate-FA (MULTIVITAMIN-PRENATAL) 27-0.8 MG TABS tablet Take 1 tablet by mouth daily at 12 noon.    . prochlorperazine (COMPAZINE) 10 MG tablet Take 1 tablet (10 mg total) by mouth every 6 (six) hours as needed for nausea or vomiting. 30 tablet 0  . raloxifene (EVISTA) 60 MG  tablet TAKE 1 TABLET DAILY 90 tablet 0  . rosuvastatin (CRESTOR) 20 MG tablet Take 1 tablet (20 mg total) by mouth daily. 90 tablet 1  . sitaGLIPtin (JANUVIA) 100 MG tablet Take 1 tablet (100 mg total) by mouth daily. 90 tablet 4  . triamcinolone cream (KENALOG) 0.1 % Apply 1 application topically 2 (two) times daily. (Patient taking differently: Apply 1 application topically 2 (two) times daily as needed (rash). ) 30 g 0  . valsartan (DIOVAN) 320 MG tablet Take 1 tablet (320 mg total) by mouth daily. 90 tablet 3   Current Facility-Administered Medications  Medication Dose Route Frequency Provider Last Rate Last Admin  . triamcinolone acetonide (KENALOG) 10 MG/ML injection 10 mg  10 mg Other Once Harriet Masson, DPM       Facility-Administered Medications Ordered in Other Visits  Medication Dose Route Frequency  Provider Last Rate Last Admin  . [START ON 03/25/2019] 0.9 %  sodium chloride infusion (Manually program via Guardrails IV Fluids)  250 mL Intravenous Once [name], MD      . 0.9 %  sodium chloride infusion (Manually program via Guardrails IV Fluids)  250 mL Intravenous Once [name], MD      . heparin lock flush 100 unit/mL  500 Units Intracatheter Once PRN [name], MD      . oxaliplatin (ELOXATIN) 100 mg in dextrose 5 % 500 mL chemo infusion  58 mg/m2 (Treatment Plan Recorded) Intravenous Once [name], MD 260 mL/hr at 03/23/19 1238 100 mg at 03/23/19 1238  . sodium chloride flush (NS) 0.9 % injection 10 mL  10 mL Intracatheter PRN [name], MD        PHYSICAL EXAMINATION: ECOG PERFORMANCE STATUS: 1 - Symptomatic but completely ambulatory  Vitals:   03/23/19 1015  BP: (!) 174/63  Pulse: 84  Resp: 17  Temp: 98.5 F (36.9 C)  SpO2: 100%   Filed Weights   03/23/19 1015  Weight: 148 lb 1.6 oz (67.2 kg)    GENERAL:alert, no distress and comfortable No skin rash or leg edema The rest of exam was deferred today   LABORATORY DATA:  I have reviewed the data as listed  CBC Latest Ref Rng & Units 03/23/2019 03/21/2019 03/08/2019  WBC 4.0 - 10.5 K/uL 5.3 5.3 4.4  Hemoglobin 12.0 - 15.0 g/dL 7.3(L) 7.7(L) 8.1(L)  Hematocrit 36.0 - 46.0 % 23.3(L) 24.4(L) 26.1(L)  Platelets 150 - 400 K/uL 164 101(L) 124(L)     CMP Latest Ref Rng & Units 03/23/2019 03/21/2019 03/08/2019  Glucose 70 - 99 mg/dL 168(H) 118(H) 140(H)  BUN 8 - 23 mg/dL _0 Creatinine 0.44 - 1.00 mg/dL 1.19(H) 1.04(H) 1.10(H)  Sodium 135 - 145 mmol/L 144 141 144  Potassium 3.5 - 5.1 mmol/L 4.0 3.6 3.8  Chloride 98 - 111 mmol/L 109 107 108  CO2 22 - 32 mmol/L _1 Calcium 8.9 - 10.3 mg/dL 8.3(L) 9.1 8.6(L)  Total Protein 6.5 - 8.1 g/dL 6.8 6.5 6.8  Total Bilirubin 0.3 - 1.2 mg/dL 0.2(L) 0.7 0.2(L)  Alkaline Phos 38 - 126 U/L 61 52 63  AST 15 - 41 U/L 43(H) 42(H) 39  ALT 0 - 44 U/L _2 RADIOGRAPHIC STUDIES: I have personally reviewed the radiological images as listed and agreed with the findings in the report. No results found.   ASSESSMENT & PLAN:  JILLAINE WAREN is a 79 y.o. female with   1.Intrahepatic cholangiocarcinoma, cT1N0M1,with peritoneal metastasis, MSS, IDH1 mutation (+) -She was diagnosed in 08/2018. Her biopsy of her liver mass shows adenocarcinoma,most consistent withcholangiocarcinoma. Her EGD/Colonoscopy from 7/10/20was negativeand 09/29/18 PET scanshowedno evidence of distant metastasis. -She was brought to OR on 9/3,unfortunately she was found to have peritoneal metastasis to right diaphragm and surgery was aborted. -Given her metastatic cancer and ineligibility forsurgery, I started her on systemicchemo with First line chemoOxaliplatin and gemcitabine q2weeksto control her disease. She started on 11/30/18.  -Her FO results showed MSI stable disease, IDH mutation positive.I would consider IDH 1 inhibitor in the future.Not a candidate for immunotherapy -We reviewed her PET images from 03/21/19 and compared to previous PET which showed liver mass  responded well to chemo with mild size reduction and much lower uptake. No new signs of disease. Will continue current therapy. She agrees  -S/p C8 she tolerated  well. She is only fatigued for 2-3 days. She does note neuropathy of her feet and cold sensitivity of hands, currently manageable. She does have anemia from chemo. -Labs reviewed and adequate to proceed with C9 Gemcitabine and Oxaliplatin today with Oxaliplatin dose reduce to 64m/m2. Continue every 2 weeks  -f/u in 2 weeks   2. Anemia -Secondary to chemo  -I previously recommended she start prenatal vitamin. -Has worsened, Hg at 7.3 today (03/23/19). I recommend blood transfusion this week, she is agreeable.   3. H/o right breast cancer, Geneticsnegative -s/p mastectomy in 1992, per patient did not require adjuvant therapy  -followed by Dr. MJana Hakim-Her genetic testing wasnegativefor pathogeneticmutations. She did have VUS of gene APC.  4.Comorbidities:DM, HTN, hiatal hernia, GERD, renal artery stenosis -f/u with PCP -We will monitor her blood pressure and glucose level closely during her chemo. We will adjust her meds as needed -Given she is on steroids I encouraged her to watch her BG at home daily and to reduce sugar intake the week of chemo.She did not tolerate Glucerna, she will continue low sugar protein shakes. -She plans to receive BP Monitor from insurance to monitor BP at home.  5. Family support  -she has a son and daughter,they were on the phone during her visit today  6.Goal of care discussion  -The patient understands the goal of care is palliative. -she is full code now  7. Hypertension, uncontrolled  -She did not take Medication before coming to clinic but did take in clinic.  -Per patient her BP is usually well controlled.   8. Neuropathy, G1 -secondary to Oxaliplatin  -She has numbness of feet when sitting and tingling when walking. She only has cold sensitivity of her hands.   -I will call in 1065mGabapentin if she feels she needs it. I encouraged her to use cushioned insole shoe  -I will reduce her Oxaliplatin dose to 6045m2starting with C9. May need to d/c in the future.   PLAN: -Hg 7.3 today, Give 2 units RBCs in the next few days  -Labs reviewed and adequate to proceed with C9Oxaliplatin andGemcitabineI will reduce her Oxaliplatin dose to 53m73m due to neuropathy  -Lab, flush, f/uandGemcitabine and Oxaliplatinin 2, 4, 6 weeks on Wednesdays    No problem-specific Assessment & Plan notes found for this encounter.   Orders Placed This Encounter  Procedures  . Practitioner attestation of consent    I, the ordering practitioner, attest that I have discussed with the patient the benefits, risks, side effects, alternatives, likelihood of achieving goals and potential problems during recovery for the procedure listed.    Standing Status:   Future    Standing Expiration Date:   03/22/2020    Order Specific Question:   Procedure    Answer:   Blood Product(s)  . Complete patient signature process for consent form    Standing Status:   Future    Standing Expiration Date:   03/22/2020  . Care order/instruction    Transfuse Parameters    Standing Status:   Future    Standing Expiration Date:   03/22/2020  . Practitioner attestation of consent    I, the ordering practitioner, attest that I have discussed with the patient the benefits, risks, side effects, alternatives, likelihood of achieving goals and potential problems during recovery for the procedure listed.    Standing Status:   Future    Standing Expiration Date:   03/22/2020    Order Specific Question:   Procedure  Answer:   Blood Product(s)  . Complete patient signature process for consent form    Standing Status:   Future    Standing Expiration Date:   03/22/2020  . Care order/instruction    Transfuse Parameters    Standing Status:   Future    Standing Expiration Date:   03/22/2020  . Type and screen     Standing Status:   Future    Number of Occurrences:   1    Standing Expiration Date:   03/22/2020  . Prepare RBC    Standing Status:   Standing    Number of Occurrences:   1    Order Specific Question:   # of Units    Answer:   2 units    Order Specific Question:   Transfusion Indications    Answer:   Symptomatic Anemia    Order Specific Question:   If emergent release call blood bank    Answer:   Not emergent release   All questions were answered. The patient knows to call the clinic with any problems, questions or concerns. No barriers to learning was detected. The total time spent in the appointment was 35 minutes.     [name], MD 03/23/2019   I, Joslyn Devon, am acting as scribe for [name], MD.   I have reviewed the above documentation for accuracy and completeness, and I agree with the above.

## 2019-03-22 NOTE — Telephone Encounter (Signed)
Error pt decided not to send a msg

## 2019-03-23 ENCOUNTER — Other Ambulatory Visit: Payer: Self-pay

## 2019-03-23 ENCOUNTER — Other Ambulatory Visit: Payer: Medicare Other

## 2019-03-23 ENCOUNTER — Inpatient Hospital Stay: Payer: Medicare Other

## 2019-03-23 ENCOUNTER — Inpatient Hospital Stay (HOSPITAL_BASED_OUTPATIENT_CLINIC_OR_DEPARTMENT_OTHER): Payer: Medicare Other | Admitting: Hematology

## 2019-03-23 ENCOUNTER — Encounter: Payer: Self-pay | Admitting: Hematology

## 2019-03-23 VITALS — BP 174/63 | HR 84 | Temp 98.5°F | Resp 17 | Ht 65.0 in | Wt 148.1 lb

## 2019-03-23 VITALS — BP 153/88 | HR 66 | Temp 98.0°F | Resp 18

## 2019-03-23 DIAGNOSIS — C221 Intrahepatic bile duct carcinoma: Secondary | ICD-10-CM

## 2019-03-23 DIAGNOSIS — I10 Essential (primary) hypertension: Secondary | ICD-10-CM | POA: Diagnosis not present

## 2019-03-23 DIAGNOSIS — N1832 Chronic kidney disease, stage 3b: Secondary | ICD-10-CM

## 2019-03-23 DIAGNOSIS — C50911 Malignant neoplasm of unspecified site of right female breast: Secondary | ICD-10-CM | POA: Diagnosis not present

## 2019-03-23 DIAGNOSIS — C786 Secondary malignant neoplasm of retroperitoneum and peritoneum: Secondary | ICD-10-CM | POA: Diagnosis not present

## 2019-03-23 DIAGNOSIS — Z95828 Presence of other vascular implants and grafts: Secondary | ICD-10-CM

## 2019-03-23 DIAGNOSIS — Z171 Estrogen receptor negative status [ER-]: Secondary | ICD-10-CM | POA: Diagnosis not present

## 2019-03-23 DIAGNOSIS — E1121 Type 2 diabetes mellitus with diabetic nephropathy: Secondary | ICD-10-CM | POA: Diagnosis not present

## 2019-03-23 DIAGNOSIS — Z5111 Encounter for antineoplastic chemotherapy: Secondary | ICD-10-CM | POA: Diagnosis not present

## 2019-03-23 DIAGNOSIS — E1122 Type 2 diabetes mellitus with diabetic chronic kidney disease: Secondary | ICD-10-CM

## 2019-03-23 DIAGNOSIS — D6481 Anemia due to antineoplastic chemotherapy: Secondary | ICD-10-CM | POA: Diagnosis not present

## 2019-03-23 LAB — CMP (CANCER CENTER ONLY)
ALT: 15 U/L (ref 0–44)
AST: 43 U/L — ABNORMAL HIGH (ref 15–41)
Albumin: 3.3 g/dL — ABNORMAL LOW (ref 3.5–5.0)
Alkaline Phosphatase: 61 U/L (ref 38–126)
Anion gap: 10 (ref 5–15)
BUN: 19 mg/dL (ref 8–23)
CO2: 25 mmol/L (ref 22–32)
Calcium: 8.3 mg/dL — ABNORMAL LOW (ref 8.9–10.3)
Chloride: 109 mmol/L (ref 98–111)
Creatinine: 1.19 mg/dL — ABNORMAL HIGH (ref 0.44–1.00)
GFR, Est AFR Am: 50 mL/min — ABNORMAL LOW (ref >60)
GFR, Estimated: 43 mL/min — ABNORMAL LOW (ref >60)
Glucose, Bld: 168 mg/dL — ABNORMAL HIGH (ref 70–99)
Potassium: 4 mmol/L (ref 3.5–5.1)
Sodium: 144 mmol/L (ref 135–145)
Total Bilirubin: 0.2 mg/dL — ABNORMAL LOW (ref 0.3–1.2)
Total Protein: 6.8 g/dL (ref 6.5–8.1)

## 2019-03-23 LAB — SAMPLE TO BLOOD BANK

## 2019-03-23 LAB — CBC WITH DIFFERENTIAL (CANCER CENTER ONLY)
Abs Immature Granulocytes: 0.03 10*3/uL (ref 0.00–0.07)
Basophils Absolute: 0 10*3/uL (ref 0.0–0.1)
Basophils Relative: 0 %
Eosinophils Absolute: 0.1 10*3/uL (ref 0.0–0.5)
Eosinophils Relative: 1 %
HCT: 23.3 % — ABNORMAL LOW (ref 36.0–46.0)
Hemoglobin: 7.3 g/dL — ABNORMAL LOW (ref 12.0–15.0)
Immature Granulocytes: 1 %
Lymphocytes Relative: 19 %
Lymphs Abs: 1 10*3/uL (ref 0.7–4.0)
MCH: 29.7 pg (ref 26.0–34.0)
MCHC: 31.3 g/dL (ref 30.0–36.0)
MCV: 94.7 fL (ref 80.0–100.0)
Monocytes Absolute: 1.4 10*3/uL — ABNORMAL HIGH (ref 0.1–1.0)
Monocytes Relative: 27 %
Neutro Abs: 2.8 10*3/uL (ref 1.7–7.7)
Neutrophils Relative %: 52 %
Platelet Count: 164 10*3/uL (ref 150–400)
RBC: 2.46 MIL/uL — ABNORMAL LOW (ref 3.87–5.11)
RDW: 20.9 % — ABNORMAL HIGH (ref 11.5–15.5)
WBC Count: 5.3 10*3/uL (ref 4.0–10.5)
nRBC: 0.4 % — ABNORMAL HIGH (ref 0.0–0.2)

## 2019-03-23 LAB — PREPARE RBC (CROSSMATCH)

## 2019-03-23 LAB — GLUCOSE, CAPILLARY: Glucose-Capillary: 39 mg/dL — CL (ref 70–99)

## 2019-03-23 MED ORDER — PALONOSETRON HCL INJECTION 0.25 MG/5ML
0.2500 mg | Freq: Once | INTRAVENOUS | Status: AC
  Administered 2019-03-23: 0.25 mg via INTRAVENOUS

## 2019-03-23 MED ORDER — SODIUM CHLORIDE 0.9 % IV SOLN
Freq: Once | INTRAVENOUS | Status: AC
  Filled 2019-03-23: qty 250

## 2019-03-23 MED ORDER — DIPHENHYDRAMINE HCL 25 MG PO CAPS
25.0000 mg | ORAL_CAPSULE | Freq: Once | ORAL | Status: AC
  Administered 2019-03-23: 25 mg via ORAL

## 2019-03-23 MED ORDER — DIPHENHYDRAMINE HCL 25 MG PO CAPS
ORAL_CAPSULE | ORAL | Status: AC
  Filled 2019-03-23: qty 1

## 2019-03-23 MED ORDER — DEXAMETHASONE SODIUM PHOSPHATE 10 MG/ML IJ SOLN
10.0000 mg | Freq: Once | INTRAMUSCULAR | Status: AC
  Administered 2019-03-23: 10 mg via INTRAVENOUS

## 2019-03-23 MED ORDER — SODIUM CHLORIDE 0.9% IV SOLUTION
250.0000 mL | Freq: Once | INTRAVENOUS | Status: AC
  Administered 2019-03-23: 250 mL via INTRAVENOUS
  Filled 2019-03-23: qty 250

## 2019-03-23 MED ORDER — DEXTROSE 5 % IV SOLN
Freq: Once | INTRAVENOUS | Status: AC
  Filled 2019-03-23: qty 250

## 2019-03-23 MED ORDER — ACETAMINOPHEN 325 MG PO TABS
650.0000 mg | ORAL_TABLET | Freq: Once | ORAL | Status: AC
  Administered 2019-03-23: 650 mg via ORAL

## 2019-03-23 MED ORDER — ACETAMINOPHEN 325 MG PO TABS
ORAL_TABLET | ORAL | Status: AC
  Filled 2019-03-23: qty 2

## 2019-03-23 MED ORDER — SODIUM CHLORIDE 0.9% FLUSH
10.0000 mL | INTRAVENOUS | Status: DC | PRN
  Administered 2019-03-23: 10 mL
  Filled 2019-03-23: qty 10

## 2019-03-23 MED ORDER — PALONOSETRON HCL INJECTION 0.25 MG/5ML
INTRAVENOUS | Status: AC
  Filled 2019-03-23: qty 5

## 2019-03-23 MED ORDER — OXALIPLATIN CHEMO INJECTION 100 MG/20ML
58.0000 mg/m2 | Freq: Once | INTRAVENOUS | Status: AC
  Administered 2019-03-23: 100 mg via INTRAVENOUS
  Filled 2019-03-23: qty 20

## 2019-03-23 MED ORDER — SODIUM CHLORIDE 0.9 % IV SOLN
1000.0000 mg/m2 | Freq: Once | INTRAVENOUS | Status: AC
  Administered 2019-03-23: 1748 mg via INTRAVENOUS
  Filled 2019-03-23: qty 45.97

## 2019-03-23 MED ORDER — SODIUM CHLORIDE 0.9% FLUSH
10.0000 mL | Freq: Once | INTRAVENOUS | Status: AC
  Administered 2019-03-23: 10 mL
  Filled 2019-03-23: qty 10

## 2019-03-23 MED ORDER — DEXAMETHASONE SODIUM PHOSPHATE 10 MG/ML IJ SOLN
INTRAMUSCULAR | Status: AC
  Filled 2019-03-23: qty 1

## 2019-03-23 MED ORDER — SODIUM CHLORIDE 0.9% IV SOLUTION
250.0000 mL | Freq: Once | INTRAVENOUS | Status: DC
  Filled 2019-03-23: qty 250

## 2019-03-23 MED ORDER — HEPARIN SOD (PORK) LOCK FLUSH 100 UNIT/ML IV SOLN
500.0000 [IU] | Freq: Once | INTRAVENOUS | Status: AC | PRN
  Administered 2019-03-23: 500 [IU]
  Filled 2019-03-23: qty 5

## 2019-03-23 NOTE — Patient Instructions (Addendum)
[facility] Discharge Instructions for Patients Receiving Chemotherapy  Today you received the following chemotherapy agents: Gemcitabine (Gemzar) and Oxaliplatin (Eloxatin)  To help prevent nausea and vomiting after your treatment, we encourage you to take your nausea medication as directed.   If you develop nausea and vomiting that is not controlled by your nausea medication, call the clinic.   BELOW ARE SYMPTOMS THAT SHOULD BE REPORTED IMMEDIATELY:  *FEVER GREATER THAN 100.5 F  *CHILLS WITH OR WITHOUT FEVER  NAUSEA AND VOMITING THAT IS NOT CONTROLLED WITH YOUR NAUSEA MEDICATION  *UNUSUAL SHORTNESS OF BREATH  *UNUSUAL BRUISING OR BLEEDING  TENDERNESS IN MOUTH AND THROAT WITH OR WITHOUT PRESENCE OF ULCERS  *URINARY PROBLEMS  *BOWEL PROBLEMS  UNUSUAL RASH Items with * indicate a potential emergency and should be followed up as soon as possible.  Feel free to call the clinic should you have any questions or concerns. The clinic phone number is [phone].  Please show the [facility] at check-in to the Emergency Department and triage nurse.  Blood Transfusion, Adult, Care After This sheet gives you information about how to care for yourself after your procedure. Your doctor may also give you more specific instructions. If you have problems or questions, contact your doctor. What can I expect after the procedure? After the procedure, it is common to have:  Bruising and soreness at the IV site.  A fever or chills on the day of the procedure. This may be your body's response to the new blood cells received.  A headache. Follow these instructions at home: Insertion site care      Follow instructions from your doctor about how to take care of your insertion site. This is where an IV tube was put into your vein. Make sure you: ? Wash your hands with soap and water before and after you change your bandage (dressing). If you cannot use soap and water,  use hand sanitizer. ? Change your bandage as told by your doctor.  Check your insertion site every day for signs of infection. Check for: ? Redness, swelling, or pain. ? Bleeding from the site. ? Warmth. ? Pus or a bad smell. General instructions  Take over-the-counter and prescription medicines only as told by your doctor.  Rest as told by your doctor.  Go back to your normal activities as told by your doctor.  Keep all follow-up visits as told by your doctor. This is important. Contact a doctor if:  You have itching or red, swollen areas of skin (hives).  You feel worried or nervous (anxious).  You feel weak after doing your normal activities.  You have redness, swelling, warmth, or pain around the insertion site.  You have blood coming from the insertion site, and the blood does not stop with pressure.  You have pus or a bad smell coming from the insertion site. Get help right away if:  You have signs of a serious reaction. This may be coming from an allergy or the body's defense system (immune system). Signs include: ? Trouble breathing or shortness of breath. ? Swelling of the face or feeling warm (flushed). ? Fever or chills. ? Head, chest, or back pain. ? Dark pee (urine) or blood in the pee. ? Widespread rash. ? Fast heartbeat. ? Feeling dizzy or light-headed. You may receive your blood transfusion in an outpatient setting. If so, you will be told whom to contact to report any reactions. These symptoms may be an emergency. Do not  wait to see if the symptoms will go away. Get medical help right away. Call your local emergency services (911 in the [place] Do not drive yourself to the hospital. Summary  Bruising and soreness at the IV site are common.  Check your insertion site every day for signs of infection.  Rest as told by your doctor. Go back to your normal activities as told by your doctor.  Get help right away if you have signs of a serious  reaction. This information is not intended to replace advice given to you by your health care provider. Make sure you discuss any questions you have with your health care provider. Document Revised: 08/25/2018 Document Reviewed: 08/25/2018 Elsevier Patient Education  2020 [place] (COVID-19) Are you at risk?  Are you at risk for the Coronavirus (COVID-19)?  To be considered HIGH RISK for Coronavirus (COVID-19), you have to meet the following criteria:  . Traveled to [place], [place], [place], [place] or [place]; or in the [place] to [place], [place], [place], or [place]; and have fever, cough, and shortness of breath within the last 2 weeks of travel OR . Been in close contact with a person diagnosed with COVID-19 within the last 2 weeks and have fever, cough, and shortness of breath . IF YOU DO NOT MEET THESE CRITERIA, YOU ARE CONSIDERED LOW RISK FOR COVID-19.  What to do if you are HIGH RISK for COVID-19?  Marland Kitchen If you are having a medical emergency, call 911. . Seek medical care right away. Before you go to a doctor's office, urgent care or emergency department, call ahead and tell them about your recent travel, contact with someone diagnosed with COVID-19, and your symptoms. You should receive instructions from your physician's office regarding next steps of care.  . When you arrive at healthcare provider, tell the healthcare staff immediately you have returned from visiting [place], [place], [place], [place] or [place]; or traveled in the [place] to [place], [place], [place], or [place]; in the last two weeks or you have been in close contact with a person diagnosed with COVID-19 in the last 2 weeks.   . Tell the health care staff about your symptoms: fever, cough and shortness of breath. . After you have been seen by a medical provider, you will be either: o Tested for (COVID-19) and discharged home on quarantine except to seek medical care if  symptoms worsen, and asked to  - Stay home and avoid contact with others until you get your results (4-5 days)  - Avoid travel on public transportation if possible (such as bus, train, or airplane) or o Sent to the Emergency Department by EMS for evaluation, COVID-19 testing, and possible admission depending on your condition and test results.  What to do if you are LOW RISK for COVID-19?  Reduce your risk of any infection by using the same precautions used for avoiding the common cold or flu:  Marland Kitchen Wash your hands often with soap and warm water for at least 20 seconds.  If soap and water are not readily available, use an alcohol-based hand sanitizer with at least 60% alcohol.  . If coughing or sneezing, cover your mouth and nose by coughing or sneezing into the elbow areas of your shirt or coat, into a tissue or into your sleeve (not your hands). . Avoid shaking hands with others and consider head nods or verbal greetings only. . Avoid touching your eyes, nose, or mouth  with unwashed hands.  . Avoid close contact with people who are sick. . Avoid places or events with large numbers of people in one location, like concerts or sporting events. . Carefully consider travel plans you have or are making. . If you are planning any travel outside or inside the [place], visit the CDC's Travelers' Health webpage for the latest health notices. . If you have some symptoms but not all symptoms, continue to monitor at home and seek medical attention if your symptoms worsen. . If you are having a medical emergency, call 911.   [facility] / e-Visit: [email or web address]         [facility]: [facility]: [phone]                   [facility]: [phone]

## 2019-03-23 NOTE — Patient Instructions (Signed)

## 2019-03-24 ENCOUNTER — Telehealth: Payer: Self-pay | Admitting: Hematology

## 2019-03-24 ENCOUNTER — Other Ambulatory Visit: Payer: Self-pay

## 2019-03-24 DIAGNOSIS — C221 Intrahepatic bile duct carcinoma: Secondary | ICD-10-CM

## 2019-03-24 LAB — ABO/RH: ABO/RH(D): A POS

## 2019-03-24 NOTE — Telephone Encounter (Signed)
Scheduled appt per 1.7 los.  Patient will get an updated appt calendar at her next scheduled appt on 1/9.

## 2019-03-25 ENCOUNTER — Other Ambulatory Visit: Payer: Self-pay

## 2019-03-25 ENCOUNTER — Inpatient Hospital Stay: Payer: Medicare Other

## 2019-03-25 DIAGNOSIS — C221 Intrahepatic bile duct carcinoma: Secondary | ICD-10-CM | POA: Diagnosis not present

## 2019-03-25 DIAGNOSIS — C786 Secondary malignant neoplasm of retroperitoneum and peritoneum: Secondary | ICD-10-CM | POA: Diagnosis not present

## 2019-03-25 DIAGNOSIS — Z5111 Encounter for antineoplastic chemotherapy: Secondary | ICD-10-CM | POA: Diagnosis not present

## 2019-03-25 DIAGNOSIS — D6481 Anemia due to antineoplastic chemotherapy: Secondary | ICD-10-CM | POA: Diagnosis not present

## 2019-03-25 MED ORDER — DIPHENHYDRAMINE HCL 25 MG PO TABS
50.0000 mg | ORAL_TABLET | Freq: Once | ORAL | Status: AC
  Administered 2019-03-25: 50 mg via ORAL

## 2019-03-25 MED ORDER — ACETAMINOPHEN 325 MG PO TABS
650.0000 mg | ORAL_TABLET | Freq: Once | ORAL | Status: AC
  Administered 2019-03-25: 09:00:00 650 mg via ORAL

## 2019-03-25 MED ORDER — SODIUM CHLORIDE 0.9% FLUSH
10.0000 mL | INTRAVENOUS | Status: AC | PRN
  Administered 2019-03-25: 10 mL
  Filled 2019-03-25: qty 10

## 2019-03-25 MED ORDER — HEPARIN SOD (PORK) LOCK FLUSH 100 UNIT/ML IV SOLN
500.0000 [IU] | Freq: Every day | INTRAVENOUS | Status: AC | PRN
  Administered 2019-03-25: 500 [IU]
  Filled 2019-03-25: qty 5

## 2019-03-25 NOTE — Patient Instructions (Signed)

## 2019-03-27 LAB — BPAM RBC
Blood Product Expiration Date: 202102022359
Blood Product Expiration Date: 202102022359
ISSUE DATE / TIME: 202101071457
ISSUE DATE / TIME: 202101090926
Unit Type and Rh: 600
Unit Type and Rh: 600

## 2019-03-27 LAB — TYPE AND SCREEN
ABO/RH(D): A POS
Antibody Screen: NEGATIVE
Unit division: 0
Unit division: 0

## 2019-03-30 ENCOUNTER — Other Ambulatory Visit: Payer: Self-pay | Admitting: Nurse Practitioner

## 2019-03-30 ENCOUNTER — Telehealth: Payer: Self-pay | Admitting: *Deleted

## 2019-03-30 MED ORDER — GABAPENTIN 100 MG PO CAPS: 100.0000 mg | ORAL_CAPSULE | Freq: Every day | ORAL | 1 refills | Status: DC

## 2019-03-30 NOTE — Telephone Encounter (Signed)
I called in for her.  Thanks, Regan Rakers

## 2019-03-30 NOTE — Telephone Encounter (Signed)
Pt called stating she would like to try medication for neuropathy in feet ( as discussed with Dr. Burr Medico at last visit ).  Pt uses  [facility] on [address] would like to have 90 day supply to cut down on copay cost. Pt's    Phone    [phone].

## 2019-03-30 NOTE — Telephone Encounter (Signed)
Pt notified of new script prescribed by Regan Rakers, NP.

## 2019-04-04 NOTE — Progress Notes (Signed)
Donna May   [phone] [phone]   Clinic Follow up Note   Patient Care Team: [name], MD as PCP - General (Family Medicine) [name], [name] Dad, MD as Consulting Physician (Oncology) [name], [name], MD as Consulting Physician (Cardiology) [name], MD as Consulting Physician (Obstetrics and Gynecology) [name], [name], NP as Nurse Practitioner (Hematology and Oncology) [facility] [name], MD as Consulting Physician (Hematology) [name], [name], MD as Consulting Physician (Gastroenterology) [name], RN (Inactive) as Oncology Nurse Navigator [name], MD as Consulting Physician (General Surgery) 04/05/2019  CHIEF COMPLAINT: F/u cholangiocarcinoma   SUMMARY OF ONCOLOGIC HISTORY: Oncology History  Malignant neoplasm of female breast ([place])  11/06/2018 Genetic Testing   Negative genetic testing on the [facility] Common Hereditary Cancers panel. A variant of uncertain significance was identified in one of her APC genes, called c.1243G>A (p.Ala415Thr).  The Common Hereditary Cancers Panel offered by [facility] includes sequencing and/or deletion duplication testing of the following 48 genes: APC, ATM, AXIN2, BARD1, BMPR1A, BRCA1, BRCA2, BRIP1, CDH1, CDK4, CDKN2A (p14ARF), CDKN2A (p16INK4a), CHEK2, CTNNA1, DICER1, EPCAM (Deletion/duplication testing only), GREM1 (promoter region deletion/duplication testing only), KIT, MEN1, MLH1, MSH2, MSH3, MSH6, MUTYH, NBN, NF1, NHTL1, PALB2, PDGFRA, PMS2, POLD1, POLE, PTEN, RAD50, RAD51C, RAD51D, RNF43, SDHB, SDHC, SDHD, SMAD4, SMARCA4. STK11, TP53, TSC1, TSC2, and VHL.  The following genes were evaluated for sequence changes only: SDHA and HOXB13 c.251G>A variant only.    11/30/2018 -  Chemotherapy   First line chemo oxaliplatin and gemcitabine q2weeks starting 11/30/18.    Intrahepatic cholangiocarcinoma ([place])  09/07/2018 Imaging   CT Chest IMPRESSION: 1.  New, enhancing mass involving segment 4 of the liver and fundus of gallbladder is concerning for malignancy. This may represent either metastatic disease from breast cancer or neoplasm primary to the liver or hepatic biliary tree. Further evaluation with contrast enhanced CT of the abdomen and pelvis is recommended. 2. No findings to suggest metastatic disease within the chest. 3.  Aortic Atherosclerosis (ICD10-I70.0). 4. Coronary artery calcifications.   09/13/2018 Pathology Results   Diagnosis Liver, needle/core biopsy - ADENOCARCINOMA. Microscopic Comment Immunohistochemistry for CK7 is positive. CK20, TTF1, CDX-2, GATA-3, PAX 8, Qualitative ER, p63 and CK5/6 are negative. The provided clinical history of remote mammary carcinoma is noted. Based on the morphology and immunophenotype of the adenocarcinoma observed in this specimen, primary cholangiocarcinoma is favored. Clinical and radiologic correlation are  encouraged. Results reported to [employer] on 09/15/2018. Intradepartmental consultation (Dr. Vic Ripper).   09/13/2018 Initial Diagnosis   Cholangiocarcinoma ([place])   09/23/2018 Procedure   Colonoscopy by Dr. Havery Moros 09/23/18  IMPRESSION - Two 3 to 4 mm polyps in the ascending colon, removed with a cold snare. Resected and retrieved. - Five 3 to 5 mm polyps in the transverse colon, removed with a cold snare. Resected and retrieved. - One 5 mm polyp at the splenic flexure, removed with a cold snare. Resected and retrieved. - Three 3 to 5 mm polyps in the sigmoid colon, removed with a cold snare. Resected and retrieved. - The examination was otherwise normal. Upper Endopscy by Dr. Havery Moros 09/23/18  IMPRESSION - Esophagogastric landmarks identified. - 2 cm hiatal hernia. - Normal esophagus otherwise. - A single gastric polyp. Resected and retrieved. - Mild gastritis. Biopsied. - Normal duodenal bulb and second portion of the duodenum.   09/23/2018 Pathology Results    Diagnosis 09/23/18 1. Surgical [P], duodenum - BENIGN SMALL BOWEL MUCOSA. - NO ACTIVE INFLAMMATION OR VILLOUS ATROPHY IDENTIFIED. 2.  Surgical [P], stomach, polyp - HYPERPLASTIC POLYP(S). - THERE IS NO EVIDENCE OF MALIGNANCY. 3. Surgical [P], gastric antrum and gastric body - CHRONIC INACTIVE GASTRITIS. - THERE IS NO EVIDENCE OF HELICOBACTER-PYLORI, DYSPLASIA, OR MALIGNANCY. - SEE COMMENT. 4. Surgical [P], colon, sigmoid, splenic flexure, transverse and ascending, polyp (9) - TUBULAR ADENOMA(S). - SESSILE SERRATED POLYP WITHOUT CYTOLOGIC DYSPLASIA. - HIGH GRADE DYSPLASIA IS NOT IDENTIFIED. 5. Surgical [P], colon, sigmoid, polyp (2) - HYPERPLASTIC POLYP(S). - THERE IS NO EVIDENCE OF MALIGNANCY.   09/23/2018 Cancer Staging   Staging form: Intrahepatic Bile Duct, AJCC 8th Edition - Clinical stage from 09/23/2018: Stage IB (cT1b, cN0, cM0) - Signed by [name], MD on 10/06/2018   09/29/2018 PET scan   PET 09/29/18 IMPRESSION: 1. Hypermetabolic mass in the RIGHT hepatic lobe consistent with biopsy proven adenocarcinoma. No additional liver metastasis. 2. No evidence of local breast cancer recurrence in the RIGHT breast or RIGHT axilla. 3. Mild bilateral hypermetabolic adrenal glands is favored benign hyperplasia. 4. No evidence of additional metastatic disease on skull base to thigh FDG PET scan.   11/06/2018 Genetic Testing   Negative genetic testing on the [facility] Common Hereditary Cancers panel. A variant of uncertain significance was identified in one of her APC genes, called c.1243G>A (p.Ala415Thr).  The Common Hereditary Cancers Panel offered by [facility] includes sequencing and/or deletion duplication testing of the following 48 genes: APC, ATM, AXIN2, BARD1, BMPR1A, BRCA1, BRCA2, BRIP1, CDH1, CDK4, CDKN2A (p14ARF), CDKN2A (p16INK4a), CHEK2, CTNNA1, DICER1, EPCAM (Deletion/duplication testing only), GREM1 (promoter region deletion/duplication testing only), KIT, MEN1, MLH1, MSH2, MSH3,  MSH6, MUTYH, NBN, NF1, NHTL1, PALB2, PDGFRA, PMS2, POLD1, POLE, PTEN, RAD50, RAD51C, RAD51D, RNF43, SDHB, SDHC, SDHD, SMAD4, SMARCA4. STK11, TP53, TSC1, TSC2, and VHL.  The following genes were evaluated for sequence changes only: SDHA and HOXB13 c.251G>A variant only.    11/17/2018 Pathology Results   Diagnosis 11/17/18 1. Soft tissue, biopsy, Diaphragmatic nodules - METASTATIC ADENOCARCINOMA, CONSISTENT WITH PATIENT'S CLINICAL HISTORY OF CHOLANGIOCARCINOMA. SEE NOTE 2. Liver, biopsy, Left - LIVER PARENCHYMA WITH A BENIGN FIBROTIC NODULE - NO EVIDENCE OF MALIGNANCY 3. Stomach, biopsy - BENIGN PAPILLARY MESOTHELIAL HYPERPLASIA - NO EVIDENCE OF MALIGNANCY   11/29/2018 Imaging   CT CAP WO Contrast  IMPRESSION: 1. Dominant liver mass appears grossly stable from 09/29/2018. Additional liver lesions are too small to characterize but were not shown to be hypermetabolic on PET. 2. Mild nodularity of both adrenal glands with associated hypermetabolism on 09/29/2018. Continued attention on follow-up exams is warranted. 3. Small right lower lobe nodules, stable from 09/07/2018. Again, attention on follow-up is recommended. 4. Trace bilateral pleural fluid. 5. Aortic atherosclerosis (ICD10-170.0). Coronary artery calcification. 6. Enlarged pulmonic trunk, indicative of pulmonary arterial hypertension.     11/30/2018 -  Chemotherapy   First line chemo oxaliplatin and gemcitabine q2weeks starting 11/30/18.    01/20/2019 Imaging   CT CAP IMPRESSION: restaging  1. Mild interval increase in size of dominant liver mass involving segment 4 and segment 5. 2. No significant or progressive adrenal nodularity identified to suggest metastatic disease. 3. Unchanged appearance of small right lower lobe and lingular lung nodules, nonspecific.   03/21/2019 PET scan   IMPRESSION: 1. Large right hepatic mass with SUV uptake near background hepatic activity, dramatic response to therapy, also with decrease in  size when compared to the prior study. 2. Signs of prior right mastectomy and axillary dissection as before. 3. Left adrenal activity in no longer above the level of activity seen in the contralateral, right  adrenal gland or in the liver. Attention on follow-up. 4. No new signs of disease.     CURRENT THERAPY: First line chemoOxaliplatinand gemcitabine q2weeksstarting 11/30/18.  INTERVAL HISTORY: Donna May returns for f/u and treatment as scheduled. She completed cycle 9 chemo on 03/23/19. She tolerates chemo well. Mild fatigue lasts 2-3 days, she remains functional and independent at home. Denies n/v/c/d. No significant abdominal pain. Appetite fluctuates. Denies mucositis. She broke a tooth but remains able to eat. Neuropathy in feet is stable. Feels like she walks on gravel. No fall. She has not started gabapentin yet but plans to start soon. No recent fever, chills, cough, chest pain, dyspnea, leg swelling. She is scheduled to get COVID19 vaccine at her church this Friday.    MEDICAL HISTORY:  Past Medical History:  Diagnosis Date  . Anemia   . Anxiety   . Arthritis   . Breast cancer ([place]) 1992  . Cataract    Bilateral eyes - surgery to remove  . Chronic kidney disease    CKD stage 3  . Complication of anesthesia    "something they use make me itch for a couple of days."  . Depression   . Diabetes mellitus    type 2  . Duodenitis 01/18/2002  . Fainting spell   . Family history of breast cancer   . Family history of prostate cancer   . GERD (gastroesophageal reflux disease)   . Heart murmur    never has caused any problems  . Hiatal hernia 08/08/2008, 01/18/2002  . History of pneumonia    x 2  . Hyperlipidemia   . Hypertension   . Liver cancer ([place]) 08/2018  . Lymphedema 2017   Right arm  . Pneumonia    x 2  . UTI (lower urinary tract infection)     SURGICAL HISTORY: Past Surgical History:  Procedure Laterality Date  . AXILLARY SURGERY     cyst removal, right    . COLONOSCOPY  09/23/2018   Dr. Havery Moros - polyps  . EYE SURGERY Bilateral    cataracts to remove  . LAPAROSCOPY N/A 11/17/2018   Procedure: LAPAROSCOPY DIAGNOSTIC, INTRAOPERATIVE ULTRASOUND, PERITONEAL BIOPSIES;  Surgeon: [name], MD;  Location: [facility];  Service: General;  Laterality: N/A;  GENERAL AND EPIDURAL  . LIVER BIOPSY  08/2018   Dr. Lindwood Coke  . MASTECTOMY  1992   right, with flap  . NM MYOCAR PERF WALL MOTION  06/11/2009   Protocol:[place], post stress EF58%, EKG negative for ischemia, low risk  . PORTACATH PLACEMENT N/A 12/02/2018   Procedure: INSERTION PORT-A-CATH;  Surgeon: [name], MD;  Location: [facility];  Service: General;  Laterality: N/A;  . RECONSTRUCTION BREAST W/ TRAM FLAP Right   . TONSILLECTOMY    . TRANSTHORACIC ECHOCARDIOGRAM  12/24/2009   LVEF =>55%, normal study  . UPPER GI ENDOSCOPY      I have reviewed the social history and family history with the patient and they are unchanged from previous note.  ALLERGIES:  has No Known Allergies.  MEDICATIONS:  Current Outpatient Medications  Medication Sig Dispense Refill  . amLODipine (NORVASC) 10 MG tablet TAKE 1 TABLET BY MOUTH EVERY DAY 90 tablet 3  . aspirin 81 MG tablet Take 81 mg by mouth at bedtime.     Marland Kitchen CALCIUM-MAGNESIUM-VITAMIN D PO Take 1 tablet by mouth once a week.     . Cholecalciferol (VITAMIN D3) 2000 UNITS TABS Take 1 tablet by mouth once a week.     Marland Kitchen  Echinacea 400 MG CAPS Take 400 mg by mouth daily.     Marland Kitchen Echinacea-Goldenseal (ECHINACEA COMB/GOLDEN SEAL) CAPS Take by mouth.    Marland Kitchen glucose blood test strip 1 each by Other route 2 (two) times daily. Use Onetouch verio test strips as instructed to check blood sugar twice daily. 100 each 2  . hydrochlorothiazide (HYDRODIURIL) 25 MG tablet Take 1 tablet (25 mg total) by mouth daily. 90 tablet 3  . HYDROcodone-acetaminophen (NORCO/VICODIN) 5-325 MG tablet Take 1 tablet by mouth every 6 (six) hours as needed for moderate pain. 10 tablet 0  .  ketoconazole (NIZORAL) 2 % cream Apply 1 fingertip amount to each foot daily. (Patient taking differently: Apply 1 application topically daily as needed (affected area of foot). Apply 1 fingertip amount to each foot daily.) 30 g 0  . lansoprazole (PREVACID) 15 MG capsule TAKE 1 CAPSULE DAILY BEFOREBREAKFAST (NEED TO MAKE AN OFFICE VISIT FOR FURTHER   REFILLS) (Patient taking differently: Take 15 mg by mouth daily. ) 90 capsule 1  . lidocaine-prilocaine (EMLA) cream Apply 1 application topically as needed. 30 g 0  . lidocaine-prilocaine (EMLA) cream Apply to affected area once 30 g 3  . loratadine (CLARITIN) 10 MG tablet Take 10 mg by mouth daily as needed for allergies.    . metFORMIN (GLUCOPHAGE-XR) 500 MG 24 hr tablet Take 1 tablet (500 mg total) by mouth 2 (two) times daily before a meal. 180 tablet 1  . metoprolol succinate (TOPROL-XL) 100 MG 24 hr tablet Take 1 tablet (100 mg total) by mouth daily. Take with or immediately following a meal. 90 tablet 1  . Omega 3 1000 MG CAPS Take 2,000 mg by mouth daily.     . ondansetron (ZOFRAN) 8 MG tablet Take 1 tablet (8 mg total) by mouth every 8 (eight) hours as needed for nausea or vomiting. 30 tablet 0  . Prenatal Vit-Fe Fumarate-FA (MULTIVITAMIN-PRENATAL) 27-0.8 MG TABS tablet Take 1 tablet by mouth daily at 12 noon.    . prochlorperazine (COMPAZINE) 10 MG tablet Take 1 tablet (10 mg total) by mouth every 6 (six) hours as needed for nausea or vomiting. 30 tablet 0  . raloxifene (EVISTA) 60 MG tablet TAKE 1 TABLET DAILY 90 tablet 0  . rosuvastatin (CRESTOR) 20 MG tablet Take 1 tablet (20 mg total) by mouth daily. 90 tablet 1  . sitaGLIPtin (JANUVIA) 100 MG tablet Take 1 tablet (100 mg total) by mouth daily. 90 tablet 4  . triamcinolone cream (KENALOG) 0.1 % Apply 1 application topically 2 (two) times daily. (Patient taking differently: Apply 1 application topically 2 (two) times daily as needed (rash). ) 30 g 0  . valsartan (DIOVAN) 320 MG tablet Take 1  tablet (320 mg total) by mouth daily. 90 tablet 3  . gabapentin (NEURONTIN) 100 MG capsule Take 1 capsule (100 mg total) by mouth at bedtime. 90 capsule 1   Current Facility-Administered Medications  Medication Dose Route Frequency Provider Last Rate Last Admin  . 0.9 %  sodium chloride infusion   Intravenous Once Alla Feeling, NP 500 mL/hr at 04/05/19 0913 New Bag at 04/05/19 0913  . triamcinolone acetonide (KENALOG) 10 MG/ML injection 10 mg  10 mg Other Once Harriet Masson, DPM       Facility-Administered Medications Ordered in Other Visits  Medication Dose Route Frequency Provider Last Rate Last Admin  . dextrose 5 % solution   Intravenous Once [name], MD      . gemcitabine [facility]) 1,748 mg  in sodium chloride 0.9 % 250 mL chemo infusion  1,000 mg/m2 (Treatment Plan Recorded) Intravenous Once [name], MD 592 mL/hr at 04/05/19 0953 1,748 mg at 04/05/19 0953  . heparin lock flush 100 unit/mL  500 Units Intracatheter Once PRN [name], MD      . oxaliplatin (ELOXATIN) 100 mg in dextrose 5 % 500 mL chemo infusion  58 mg/m2 (Treatment Plan Recorded) Intravenous Once [name], MD      . sodium chloride flush (NS) 0.9 % injection 10 mL  10 mL Intracatheter PRN [name], MD        PHYSICAL EXAMINATION: ECOG PERFORMANCE STATUS: 0 - Asymptomatic  Vitals:   04/05/19 0824  BP: (!) 168/74  Pulse: 74  Resp: 17  Temp: 97.8 F (36.6 C)  SpO2: 100%   Filed Weights   04/05/19 0824  Weight: 144 lb 12.8 oz (65.7 kg)    GENERAL:alert, no distress and comfortable SKIN: no rash. Palms with mild hyperpigmentation EYES: sclera clear OROPHARYNX: broken front tooth. No thrush or ulcers NECK: no thrush or ulcers LUNGS: clear with normal breathing effort HEART: regular rate, no lower extremity edema ABDOMEN: abdomen soft, non-tender and normal bowel sounds. No hepatomegaly  NEURO: alert & oriented x 3 with fluent speech PAC without erythema   LABORATORY DATA:  I have reviewed the data  as listed CBC Latest Ref Rng & Units 04/05/2019 03/23/2019 03/21/2019  WBC 4.0 - 10.5 K/uL 5.6 5.3 5.3  Hemoglobin 12.0 - 15.0 g/dL 10.3(L) 7.3(L) 7.7(L)  Hematocrit 36.0 - 46.0 % 31.3(L) 23.3(L) 24.4(L)  Platelets 150 - 400 K/uL 99(L) 164 101(L)     CMP Latest Ref Rng & Units 04/05/2019 03/23/2019 03/21/2019  Glucose 70 - 99 mg/dL 172(H) 168(H) 118(H)  BUN 8 - 23 mg/dL '17 19 17  '$ Creatinine 0.44 - 1.00 mg/dL 1.23(H) 1.19(H) 1.04(H)  Sodium 135 - 145 mmol/L 140 144 141  Potassium 3.5 - 5.1 mmol/L 3.8 4.0 3.6  Chloride 98 - 111 mmol/L 106 109 107  CO2 22 - 32 mmol/L '24 25 26  '$ Calcium 8.9 - 10.3 mg/dL 8.5(L) 8.3(L) 9.1  Total Protein 6.5 - 8.1 g/dL 6.7 6.8 6.5  Total Bilirubin 0.3 - 1.2 mg/dL 0.4 0.2(L) 0.7  Alkaline Phos 38 - 126 U/L 70 61 52  AST 15 - 41 U/L 44(H) 43(H) 42(H)  ALT 0 - 44 U/L '18 15 17      '$ RADIOGRAPHIC STUDIES: I have personally reviewed the radiological images as listed and agreed with the findings in the report. No results found.   ASSESSMENT & PLAN: Donna May [id number]   1.Intrahepatic cholangiocarcinoma, cT1N0M1,with peritoneal metastasis, MSS, IDH1 mutation (+) -Diagnosed in 09/2018 biopsy of her liver mass shows adenocarcinoma,most consistent withcholangiocarcinoma -09/29/18 PET scanshowedno evidence of distant metastasis. -Unfortunately she was found to have peritoneal metastasis to right diaphragm and surgery was aborted. -HerCT AP from 9/15/20shows dominate liver mass stable to mild increase, mild nodularity of both adrenal glands which warrants being monitored.No visible peritoneal metastasis on CT scan. -Given her metastatic cancer and ineligibility forsurgery,she began first-line systemic chemotherapy for disease control.Due to her CKD, she is not a candidate for cisplatin.  -FO resultsshowed MSI stable disease, IDH mutation positive.Consider IDH 1 inhibitor in the future.Not a candidate for immunotherapy -She began first  line oxaliplatin and gemcitabine every 2 weeks on 11/30/2018 -CT from 01/20/19 showed overall stable disease. PET on 03/21/19 showed mild size reduction and much lower uptake in the liver. She is responding  well to chemo and tolerates very well overall. S/p 9 cycles.   2. H/o right breast cancer, Geneticsnegative -s/p mastectomy in 1992, per patient did not require adjuvant therapy  -previously followed by Dr. Jana Hakim -VUS of gene APC;genetics otherwise normal  3.Comorbidities:DM, HTN, hiatal hernia, GERD, renal artery stenosis -Follow-up with PCP, nephrology -Cr fluctuates, improved from July - Sept 2020 -Crfluctuates, 1.10today; overall much improved from June - Sept this year (1.78 at highest). -I encouraged her to hydrate and avoid nephrotoxic agents.  4. Family support  -she has a son and daughterwho are supportive. -patient takes pride in remaining independent   5.Goal of care discussion  -We frequently discuss the goal of her treatment is palliative and that her cancer is not curable with chemo alone at this stage. The goal is to control her disease, prolong her life, and give her good quality of life. She understands -she is full code now  6. Hypoglycemic event, 12/2018 -She fell at the store, attributes to hypoglycemia but did not check sugar at the time. BG before PET scan in 03/2019 was incorrectly reported at 39 but then normal on repeat CBG -Only takes metformin once daily.  -No recent hypoglycemic events -F/u with Dr. Dwyane Dee  Disposition: Donna May appears stable. She completed 9 cycles of first line gemcitabine and oxaliplatin. She tolerates treatment very well overall, with mild fatigue for 2-3 days and G1 CIPN in her feet. Oxaliplatin has been dose reduced. She has not started gabapentin yet. She remains independent and recovers well after treatment. Labs reviewed. Anemia improved with RBC transfusion. PLT 99K, no active bleeding. CMP stable except  up-trending creatinine. Will give 500 cc NS with treatment today. Labs adequate for chemo. She will proceed with cycle 10 gemcitabine and oxaliplatin today, at same doses. She will return for f/u and next cycle in 2 weeks.   Given she had good response on last restaging, she asked about changing her treatment interval to q3 weeks. I reviewed that we know her cancer is responding to q2 weeks treatment, and we won't know if she will continue to have same good response with longer treatment interval. I explained we will grant her a treatment break when she desires, and that we value her wishes and quality of life. She agrees to continue chemo every 2 weeks for now.  She will have first COVID19 vaccine inj on 04/07/19. I reviewed expected side effects. She knows to call us if she has any concerns.   No problem-specific Assessment & Plan notes found for this encounter.   No orders of the defined types were placed in this encounter.  All questions were answered. The patient knows to call the clinic with any problems, questions or concerns. No barriers to learning was detected. I spent 20 minutes counseling the patient face to face. The total time spent in the appointment was 25 minutes and more than 50% was on counseling and review of test results     Alla Feeling, NP 04/05/19

## 2019-04-05 ENCOUNTER — Inpatient Hospital Stay: Payer: Medicare Other

## 2019-04-05 ENCOUNTER — Encounter: Payer: Self-pay | Admitting: Nurse Practitioner

## 2019-04-05 ENCOUNTER — Other Ambulatory Visit: Payer: Self-pay

## 2019-04-05 ENCOUNTER — Inpatient Hospital Stay (HOSPITAL_BASED_OUTPATIENT_CLINIC_OR_DEPARTMENT_OTHER): Payer: Medicare Other | Admitting: Nurse Practitioner

## 2019-04-05 VITALS — BP 168/74 | HR 74 | Temp 97.8°F | Resp 17 | Ht 65.0 in | Wt 144.8 lb

## 2019-04-05 DIAGNOSIS — C221 Intrahepatic bile duct carcinoma: Secondary | ICD-10-CM | POA: Diagnosis not present

## 2019-04-05 DIAGNOSIS — C786 Secondary malignant neoplasm of retroperitoneum and peritoneum: Secondary | ICD-10-CM | POA: Diagnosis not present

## 2019-04-05 DIAGNOSIS — D6481 Anemia due to antineoplastic chemotherapy: Secondary | ICD-10-CM | POA: Diagnosis not present

## 2019-04-05 DIAGNOSIS — Z95828 Presence of other vascular implants and grafts: Secondary | ICD-10-CM

## 2019-04-05 DIAGNOSIS — Z5111 Encounter for antineoplastic chemotherapy: Secondary | ICD-10-CM | POA: Diagnosis not present

## 2019-04-05 LAB — CBC WITH DIFFERENTIAL (CANCER CENTER ONLY)
Abs Immature Granulocytes: 0.01 10*3/uL (ref 0.00–0.07)
Basophils Absolute: 0 10*3/uL (ref 0.0–0.1)
Basophils Relative: 0 %
Eosinophils Absolute: 0.1 10*3/uL (ref 0.0–0.5)
Eosinophils Relative: 2 %
HCT: 31.3 % — ABNORMAL LOW (ref 36.0–46.0)
Hemoglobin: 10.3 g/dL — ABNORMAL LOW (ref 12.0–15.0)
Immature Granulocytes: 0 %
Lymphocytes Relative: 21 %
Lymphs Abs: 1.2 10*3/uL (ref 0.7–4.0)
MCH: 30.1 pg (ref 26.0–34.0)
MCHC: 32.9 g/dL (ref 30.0–36.0)
MCV: 91.5 fL (ref 80.0–100.0)
Monocytes Absolute: 0.9 10*3/uL (ref 0.1–1.0)
Monocytes Relative: 17 %
Neutro Abs: 3.4 10*3/uL (ref 1.7–7.7)
Neutrophils Relative %: 60 %
Platelet Count: 99 10*3/uL — ABNORMAL LOW (ref 150–400)
RBC: 3.42 MIL/uL — ABNORMAL LOW (ref 3.87–5.11)
RDW: 18.5 % — ABNORMAL HIGH (ref 11.5–15.5)
WBC Count: 5.6 10*3/uL (ref 4.0–10.5)
nRBC: 0 % (ref 0.0–0.2)

## 2019-04-05 LAB — CMP (CANCER CENTER ONLY)
ALT: 18 U/L (ref 0–44)
AST: 44 U/L — ABNORMAL HIGH (ref 15–41)
Albumin: 3.1 g/dL — ABNORMAL LOW (ref 3.5–5.0)
Alkaline Phosphatase: 70 U/L (ref 38–126)
Anion gap: 10 (ref 5–15)
BUN: 17 mg/dL (ref 8–23)
CO2: 24 mmol/L (ref 22–32)
Calcium: 8.5 mg/dL — ABNORMAL LOW (ref 8.9–10.3)
Chloride: 106 mmol/L (ref 98–111)
Creatinine: 1.23 mg/dL — ABNORMAL HIGH (ref 0.44–1.00)
GFR, Est AFR Am: 48 mL/min — ABNORMAL LOW (ref >60)
GFR, Estimated: 41 mL/min — ABNORMAL LOW (ref >60)
Glucose, Bld: 172 mg/dL — ABNORMAL HIGH (ref 70–99)
Potassium: 3.8 mmol/L (ref 3.5–5.1)
Sodium: 140 mmol/L (ref 135–145)
Total Bilirubin: 0.4 mg/dL (ref 0.3–1.2)
Total Protein: 6.7 g/dL (ref 6.5–8.1)

## 2019-04-05 MED ORDER — SODIUM CHLORIDE 0.9% FLUSH
10.0000 mL | INTRAVENOUS | Status: DC | PRN
  Administered 2019-04-05: 13:00:00 10 mL
  Filled 2019-04-05: qty 10

## 2019-04-05 MED ORDER — SODIUM CHLORIDE 0.9 % IV SOLN
Freq: Once | INTRAVENOUS | Status: AC
  Filled 2019-04-05: qty 250

## 2019-04-05 MED ORDER — DEXAMETHASONE SODIUM PHOSPHATE 10 MG/ML IJ SOLN
10.0000 mg | Freq: Once | INTRAMUSCULAR | Status: AC
  Administered 2019-04-05: 09:00:00 10 mg via INTRAVENOUS

## 2019-04-05 MED ORDER — OXALIPLATIN CHEMO INJECTION 100 MG/20ML
58.0000 mg/m2 | Freq: Once | INTRAVENOUS | Status: AC
  Administered 2019-04-05: 11:00:00 100 mg via INTRAVENOUS
  Filled 2019-04-05: qty 20

## 2019-04-05 MED ORDER — SODIUM CHLORIDE 0.9% FLUSH
10.0000 mL | Freq: Once | INTRAVENOUS | Status: AC
  Administered 2019-04-05: 10 mL
  Filled 2019-04-05: qty 10

## 2019-04-05 MED ORDER — SODIUM CHLORIDE 0.9 % IV SOLN
1000.0000 mg/m2 | Freq: Once | INTRAVENOUS | Status: AC
  Administered 2019-04-05: 10:00:00 1748 mg via INTRAVENOUS
  Filled 2019-04-05: qty 45.97

## 2019-04-05 MED ORDER — PALONOSETRON HCL INJECTION 0.25 MG/5ML
0.2500 mg | Freq: Once | INTRAVENOUS | Status: AC
  Administered 2019-04-05: 09:00:00 0.25 mg via INTRAVENOUS

## 2019-04-05 MED ORDER — DEXTROSE 5 % IV SOLN
Freq: Once | INTRAVENOUS | Status: AC
  Filled 2019-04-05: qty 250

## 2019-04-05 MED ORDER — DEXAMETHASONE SODIUM PHOSPHATE 10 MG/ML IJ SOLN
INTRAMUSCULAR | Status: AC
  Filled 2019-04-05: qty 1

## 2019-04-05 MED ORDER — HEPARIN SOD (PORK) LOCK FLUSH 100 UNIT/ML IV SOLN
500.0000 [IU] | Freq: Once | INTRAVENOUS | Status: AC | PRN
  Administered 2019-04-05: 13:00:00 500 [IU]
  Filled 2019-04-05: qty 5

## 2019-04-05 MED ORDER — PALONOSETRON HCL INJECTION 0.25 MG/5ML
INTRAVENOUS | Status: AC
  Filled 2019-04-05: qty 5

## 2019-04-05 NOTE — Patient Instructions (Signed)
[facility] Discharge Instructions for Patients Receiving Chemotherapy  Today you received the following chemotherapy agents: Gemcitabine (Gemzar) and Oxaliplatin (Eloxatin)  To help prevent nausea and vomiting after your treatment, we encourage you to take your nausea medication as directed.   If you develop nausea and vomiting that is not controlled by your nausea medication, call the clinic.   BELOW ARE SYMPTOMS THAT SHOULD BE REPORTED IMMEDIATELY:  *FEVER GREATER THAN 100.5 F  *CHILLS WITH OR WITHOUT FEVER  NAUSEA AND VOMITING THAT IS NOT CONTROLLED WITH YOUR NAUSEA MEDICATION  *UNUSUAL SHORTNESS OF BREATH  *UNUSUAL BRUISING OR BLEEDING  TENDERNESS IN MOUTH AND THROAT WITH OR WITHOUT PRESENCE OF ULCERS  *URINARY PROBLEMS  *BOWEL PROBLEMS  UNUSUAL RASH Items with * indicate a potential emergency and should be followed up as soon as possible.  Feel free to call the clinic should you have any questions or concerns. The clinic phone number is [phone].  Please show the [facility] at check-in to the Emergency Department and triage nurse.

## 2019-04-05 NOTE — Progress Notes (Signed)
Per Cira Rue, NP ok to treat with platlets 99.

## 2019-04-06 ENCOUNTER — Other Ambulatory Visit: Payer: Medicare Other

## 2019-04-06 ENCOUNTER — Telehealth: Payer: Self-pay | Admitting: Nurse Practitioner

## 2019-04-06 ENCOUNTER — Ambulatory Visit: Payer: Medicare Other | Admitting: Hematology

## 2019-04-06 ENCOUNTER — Ambulatory Visit: Payer: Medicare Other

## 2019-04-06 NOTE — Telephone Encounter (Signed)
Scheduled appt per 1/20 los. 

## 2019-04-07 ENCOUNTER — Ambulatory Visit: Payer: Medicare Other | Attending: Internal Medicine

## 2019-04-07 DIAGNOSIS — Z23 Encounter for immunization: Secondary | ICD-10-CM

## 2019-04-10 NOTE — Progress Notes (Signed)
   Covid-19 Vaccination Clinic  Name:  MINKA KNIGHT    MRN: [id number] DOB: Apr 29, 1940  04/07/2019  Ms. Jaskolski was observed post Covid-19 immunization for 15 minutes without incidence. She was provided with Vaccine Information Sheet and instruction to access the V-Safe system.   Ms. Sherpa was instructed to call 911 with any severe reactions post vaccine: Marland Kitchen Difficulty breathing  . Swelling of your face and throat  . A fast heartbeat  . A bad rash all over your body  . Dizziness and weakness    Immunizations Administered    Name Date Dose VIS Date Route   Moderna COVID-19 Vaccine 04/07/2019 10:00 AM 0.5 mL 02/14/2019 Intramuscular   Manufacturer: Moderna   Lot: [id number]   [place]: [id number]

## 2019-04-12 NOTE — Progress Notes (Signed)
Donna May   [phone] [phone]   Clinic Follow up Note   Patient Care Team: [name], MD as PCP - General (Family Medicine) [name], [name] Dad, MD as Consulting Physician (Oncology) [name], [name], MD as Consulting Physician (Cardiology) [name], MD as Consulting Physician (Obstetrics and Gynecology) [name], [name], NP as Nurse Practitioner (Hematology and Oncology) [facility] [name], MD as Consulting Physician (Hematology) [name], [name], MD as Consulting Physician (Gastroenterology) Virgina Evener, Dawn, RN (Inactive) as Oncology Nurse Navigator [name], MD as Consulting Physician (General Surgery)  Date of Service:  04/19/2019  CHIEF COMPLAINT: F/u ofcholangiocarcinomaof liver  SUMMARY OF ONCOLOGIC HISTORY: Oncology History  Malignant neoplasm of female breast ([place])  11/06/2018 Genetic Testing   Negative genetic testing on the [facility] Common Hereditary Cancers panel. A variant of uncertain significance was identified in one of her APC genes, called c.1243G>A (p.Ala415Thr).  The Common Hereditary Cancers Panel offered by [facility] includes sequencing and/or deletion duplication testing of the following 48 genes: APC, ATM, AXIN2, BARD1, BMPR1A, BRCA1, BRCA2, BRIP1, CDH1, CDK4, CDKN2A (p14ARF), CDKN2A (p16INK4a), CHEK2, CTNNA1, DICER1, EPCAM (Deletion/duplication testing only), GREM1 (promoter region deletion/duplication testing only), KIT, MEN1, MLH1, MSH2, MSH3, MSH6, MUTYH, NBN, NF1, NHTL1, PALB2, PDGFRA, PMS2, POLD1, POLE, PTEN, RAD50, RAD51C, RAD51D, RNF43, SDHB, SDHC, SDHD, SMAD4, SMARCA4. STK11, TP53, TSC1, TSC2, and VHL.  The following genes were evaluated for sequence changes only: SDHA and HOXB13 c.251G>A variant only.    11/30/2018 -  Chemotherapy   First line chemo oxaliplatin and gemcitabine q2weeks starting 11/30/18.    Intrahepatic cholangiocarcinoma ([place])  09/07/2018 Imaging     CT Chest IMPRESSION: 1. New, enhancing mass involving segment 4 of the liver and fundus of gallbladder is concerning for malignancy. This may represent either metastatic disease from breast cancer or neoplasm primary to the liver or hepatic biliary tree. Further evaluation with contrast enhanced CT of the abdomen and pelvis is recommended. 2. No findings to suggest metastatic disease within the chest. 3.  Aortic Atherosclerosis (ICD10-I70.0). 4. Coronary artery calcifications.   09/13/2018 Pathology Results   Diagnosis Liver, needle/core biopsy - ADENOCARCINOMA. Microscopic Comment Immunohistochemistry for CK7 is positive. CK20, TTF1, CDX-2, GATA-3, PAX 8, Qualitative ER, p63 and CK5/6 are negative. The provided clinical history of remote mammary carcinoma is noted. Based on the morphology and immunophenotype of the adenocarcinoma observed in this specimen, primary cholangiocarcinoma is favored. Clinical and radiologic correlation are  encouraged. Results reported to [employer] on 09/15/2018. Intradepartmental consultation (Dr. Vic Ripper).   09/13/2018 Initial Diagnosis   Cholangiocarcinoma ([place])   09/23/2018 Procedure   Colonoscopy by Dr. Havery Moros 09/23/18  IMPRESSION - Two 3 to 4 mm polyps in the ascending colon, removed with a cold snare. Resected and retrieved. - Five 3 to 5 mm polyps in the transverse colon, removed with a cold snare. Resected and retrieved. - One 5 mm polyp at the splenic flexure, removed with a cold snare. Resected and retrieved. - Three 3 to 5 mm polyps in the sigmoid colon, removed with a cold snare. Resected and retrieved. - The examination was otherwise normal. Upper Endopscy by Dr. Havery Moros 09/23/18  IMPRESSION - Esophagogastric landmarks identified. - 2 cm hiatal hernia. - Normal esophagus otherwise. - A single gastric polyp. Resected and retrieved. - Mild gastritis. Biopsied. - Normal duodenal bulb and second portion of the duodenum.   09/23/2018  Pathology Results   Diagnosis 09/23/18 1. Surgical [P], duodenum - BENIGN SMALL BOWEL MUCOSA. - NO ACTIVE  INFLAMMATION OR VILLOUS ATROPHY IDENTIFIED. 2. Surgical [P], stomach, polyp - HYPERPLASTIC POLYP(S). - THERE IS NO EVIDENCE OF MALIGNANCY. 3. Surgical [P], gastric antrum and gastric body - CHRONIC INACTIVE GASTRITIS. - THERE IS NO EVIDENCE OF HELICOBACTER-PYLORI, DYSPLASIA, OR MALIGNANCY. - SEE COMMENT. 4. Surgical [P], colon, sigmoid, splenic flexure, transverse and ascending, polyp (9) - TUBULAR ADENOMA(S). - SESSILE SERRATED POLYP WITHOUT CYTOLOGIC DYSPLASIA. - HIGH GRADE DYSPLASIA IS NOT IDENTIFIED. 5. Surgical [P], colon, sigmoid, polyp (2) - HYPERPLASTIC POLYP(S). - THERE IS NO EVIDENCE OF MALIGNANCY.   09/23/2018 Cancer Staging   Staging form: Intrahepatic Bile Duct, AJCC 8th Edition - Clinical stage from 09/23/2018: Stage IB (cT1b, cN0, cM0) - Signed by [name], MD on 10/06/2018   09/29/2018 PET scan   PET 09/29/18 IMPRESSION: 1. Hypermetabolic mass in the RIGHT hepatic lobe consistent with biopsy proven adenocarcinoma. No additional liver metastasis. 2. No evidence of local breast cancer recurrence in the RIGHT breast or RIGHT axilla. 3. Mild bilateral hypermetabolic adrenal glands is favored benign hyperplasia. 4. No evidence of additional metastatic disease on skull base to thigh FDG PET scan.   11/06/2018 Genetic Testing   Negative genetic testing on the [employer] Common Hereditary Cancers panel. A variant of uncertain significance was identified in one of her APC genes, called c.1243G>A (p.Ala415Thr).  The Common Hereditary Cancers Panel offered by [facility] includes sequencing and/or deletion duplication testing of the following 48 genes: APC, ATM, AXIN2, BARD1, BMPR1A, BRCA1, BRCA2, BRIP1, CDH1, CDK4, CDKN2A (p14ARF), CDKN2A (p16INK4a), CHEK2, CTNNA1, DICER1, EPCAM (Deletion/duplication testing only), GREM1 (promoter region deletion/duplication testing only), KIT,  MEN1, MLH1, MSH2, MSH3, MSH6, MUTYH, NBN, NF1, NHTL1, PALB2, PDGFRA, PMS2, POLD1, POLE, PTEN, RAD50, RAD51C, RAD51D, RNF43, SDHB, SDHC, SDHD, SMAD4, SMARCA4. STK11, TP53, TSC1, TSC2, and VHL.  The following genes were evaluated for sequence changes only: SDHA and HOXB13 c.251G>A variant only.    11/17/2018 Pathology Results   Diagnosis 11/17/18 1. Soft tissue, biopsy, Diaphragmatic nodules - METASTATIC ADENOCARCINOMA, CONSISTENT WITH PATIENT'S CLINICAL HISTORY OF CHOLANGIOCARCINOMA. SEE NOTE 2. Liver, biopsy, Left - LIVER PARENCHYMA WITH A BENIGN FIBROTIC NODULE - NO EVIDENCE OF MALIGNANCY 3. Stomach, biopsy - BENIGN PAPILLARY MESOTHELIAL HYPERPLASIA - NO EVIDENCE OF MALIGNANCY   11/29/2018 Imaging   CT CAP WO Contrast  IMPRESSION: 1. Dominant liver mass appears grossly stable from 09/29/2018. Additional liver lesions are too small to characterize but were not shown to be hypermetabolic on PET. 2. Mild nodularity of both adrenal glands with associated hypermetabolism on 09/29/2018. Continued attention on follow-up exams is warranted. 3. Small right lower lobe nodules, stable from 09/07/2018. Again, attention on follow-up is recommended. 4. Trace bilateral pleural fluid. 5. Aortic atherosclerosis (ICD10-170.0). Coronary artery calcification. 6. Enlarged pulmonic trunk, indicative of pulmonary arterial hypertension.     11/30/2018 -  Chemotherapy   First line chemo oxaliplatin and gemcitabine q2weeks starting 11/30/18.    01/20/2019 Imaging   CT CAP IMPRESSION: restaging  1. Mild interval increase in size of dominant liver mass involving segment 4 and segment 5. 2. No significant or progressive adrenal nodularity identified to suggest metastatic disease. 3. Unchanged appearance of small right lower lobe and lingular lung nodules, nonspecific.   03/21/2019 PET scan   IMPRESSION: 1. Large right hepatic mass with SUV uptake near background hepatic activity, dramatic response to  therapy, also with decrease in size when compared to the prior study. 2. Signs of prior right mastectomy and axillary dissection as before. 3. Left adrenal activity in no longer above the level of  activity seen in the contralateral, right adrenal gland or in the liver. Attention on follow-up. 4. No new signs of disease.      CURRENT THERAPY:  First line chemoOxaliplatinand gemcitabine q2weeksstarting 11/30/18.  INTERVAL HISTORY:  Donna May is here for a follow up and treatment. She presents to the clinic alone. She has lost 5-6 pounds in two weeks due to taste change ans loss of taste has effective her appetite. She notes she could start eating but wont finish. She notes she eats better with others. She does live by herself. She notes she can taste salt and sugar but does not add much of it usually. She notes being unstable on her feet due to neuropathy. She notes after recent blood transfusion she has more hair growth and wonders if there were hormones in the blood.     REVIEW OF SYSTEMS:   Constitutional: Denies fevers, chills (+) Taste change, weight loss Eyes: Denies blurriness of vision Ears, nose, mouth, throat, and face: Denies mucositis or sore throat Respiratory: Denies cough, dyspnea or wheezes Cardiovascular: Denies palpitation, chest discomfort or lower extremity swelling Gastrointestinal:  Denies nausea, heartburn or change in bowel habits Skin: Denies abnormal skin rashes Lymphatics: Denies new lymphadenopathy or easy bruising Neurological: (+) Mild progression in neuropathy of feet  Behavioral/Psych: Mood is stable, no new changes  All other systems were reviewed with the patient and are negative.  MEDICAL HISTORY:  Past Medical History:  Diagnosis Date  . Anemia   . Anxiety   . Arthritis   . Breast cancer ([place]) 1992  . Cataract    Bilateral eyes - surgery to remove  . Chronic kidney disease    CKD stage 3  . Complication of anesthesia    "something  they use make me itch for a couple of days."  . Depression   . Diabetes mellitus    type 2  . Duodenitis 01/18/2002  . Fainting spell   . Family history of breast cancer   . Family history of prostate cancer   . GERD (gastroesophageal reflux disease)   . Heart murmur    never has caused any problems  . Hiatal hernia 08/08/2008, 01/18/2002  . History of pneumonia    x 2  . Hyperlipidemia   . Hypertension   . Liver cancer ([place]) 08/2018  . Lymphedema 2017   Right arm  . Pneumonia    x 2  . UTI (lower urinary tract infection)     SURGICAL HISTORY: Past Surgical History:  Procedure Laterality Date  . AXILLARY SURGERY     cyst removal, right  . COLONOSCOPY  09/23/2018   Dr. Havery Moros - polyps  . EYE SURGERY Bilateral    cataracts to remove  . LAPAROSCOPY N/A 11/17/2018   Procedure: LAPAROSCOPY DIAGNOSTIC, INTRAOPERATIVE ULTRASOUND, PERITONEAL BIOPSIES;  Surgeon: [name], MD;  Location: [facility];  Service: General;  Laterality: N/A;  GENERAL AND EPIDURAL  . LIVER BIOPSY  08/2018   Dr. Lindwood Coke  . MASTECTOMY  1992   right, with flap  . NM MYOCAR PERF WALL MOTION  06/11/2009   Protocol:[place], post stress EF58%, EKG negative for ischemia, low risk  . PORTACATH PLACEMENT N/A 12/02/2018   Procedure: INSERTION PORT-A-CATH;  Surgeon: [name], MD;  Location: [facility];  Service: General;  Laterality: N/A;  . RECONSTRUCTION BREAST W/ TRAM FLAP Right   . TONSILLECTOMY    . TRANSTHORACIC ECHOCARDIOGRAM  12/24/2009   LVEF =>55%, normal study  . UPPER GI ENDOSCOPY  I have reviewed the social history and family history with the patient and they are unchanged from previous note.  ALLERGIES:  has No Known Allergies.  MEDICATIONS:  Current Outpatient Medications  Medication Sig Dispense Refill  . amLODipine (NORVASC) 10 MG tablet TAKE 1 TABLET BY MOUTH EVERY DAY 90 tablet 3  . aspirin 81 MG tablet Take 81 mg by mouth at bedtime.     . Calcium Carbonate-Vitamin D 600-200 MG-UNIT  TABS Take 1 tablet by mouth daily.    Marland Kitchen CALCIUM-MAGNESIUM-VITAMIN D PO Take 1 tablet by mouth once a week.     . Cholecalciferol (VITAMIN D3) 2000 UNITS TABS Take 1 tablet by mouth once a week.     . Echinacea 400 MG CAPS Take 400 mg by mouth daily.     Marland Kitchen Echinacea-Goldenseal (ECHINACEA COMB/GOLDEN SEAL) CAPS Take by mouth.    . gabapentin (NEURONTIN) 100 MG capsule Take 1 capsule (100 mg total) by mouth at bedtime. 90 capsule 1  . glucose blood test strip 1 each by Other route 2 (two) times daily. Use Onetouch verio test strips as instructed to check blood sugar twice daily. 100 each 2  . hydrochlorothiazide (HYDRODIURIL) 25 MG tablet Take 1 tablet (25 mg total) by mouth daily. 90 tablet 3  . HYDROcodone-acetaminophen (NORCO/VICODIN) 5-325 MG tablet Take 1 tablet by mouth every 6 (six) hours as needed for moderate pain. 10 tablet 0  . ketoconazole (NIZORAL) 2 % cream Apply 1 fingertip amount to each foot daily. (Patient taking differently: Apply 1 application topically daily as needed (affected area of foot). Apply 1 fingertip amount to each foot daily.) 30 g 0  . lansoprazole (PREVACID) 15 MG capsule TAKE 1 CAPSULE DAILY BEFOREBREAKFAST (NEED TO MAKE AN OFFICE VISIT FOR FURTHER   REFILLS) (Patient taking differently: Take 15 mg by mouth daily. ) 90 capsule 1  . lidocaine-prilocaine (EMLA) cream Apply 1 application topically as needed. 30 g 0  . lidocaine-prilocaine (EMLA) cream Apply to affected area once 30 g 3  . loratadine (CLARITIN) 10 MG tablet Take 10 mg by mouth daily as needed for allergies.    . metFORMIN (GLUCOPHAGE-XR) 500 MG 24 hr tablet Take 1 tablet (500 mg total) by mouth 2 (two) times daily before a meal. 180 tablet 1  . metoprolol succinate (TOPROL-XL) 100 MG 24 hr tablet Take 1 tablet (100 mg total) by mouth daily. Take with or immediately following a meal. 90 tablet 1  . Omega 3 1000 MG CAPS Take 2,000 mg by mouth daily.     . ondansetron (ZOFRAN) 8 MG tablet Take 1 tablet (8 mg  total) by mouth every 8 (eight) hours as needed for nausea or vomiting. 30 tablet 0  . Prenatal Vit-Fe Fumarate-FA (MULTIVITAMIN-PRENATAL) 27-0.8 MG TABS tablet Take 1 tablet by mouth daily at 12 noon.    . prochlorperazine (COMPAZINE) 10 MG tablet Take 1 tablet (10 mg total) by mouth every 6 (six) hours as needed for nausea or vomiting. 30 tablet 0  . raloxifene (EVISTA) 60 MG tablet TAKE 1 TABLET DAILY 90 tablet 0  . rosuvastatin (CRESTOR) 20 MG tablet Take 1 tablet (20 mg total) by mouth daily. 90 tablet 1  . sitaGLIPtin (JANUVIA) 100 MG tablet Take 1 tablet (100 mg total) by mouth daily. 90 tablet 4  . triamcinolone cream (KENALOG) 0.1 % Apply 1 application topically 2 (two) times daily. (Patient taking differently: Apply 1 application topically 2 (two) times daily as needed (rash). ) 30 g 0  .  valsartan (DIOVAN) 320 MG tablet Take 1 tablet (320 mg total) by mouth daily. 90 tablet 3   Current Facility-Administered Medications  Medication Dose Route Frequency Provider Last Rate Last Admin  . triamcinolone acetonide (KENALOG) 10 MG/ML injection 10 mg  10 mg Other Once Harriet Masson, DPM       Facility-Administered Medications Ordered in Other Visits  Medication Dose Route Frequency Provider Last Rate Last Admin  . heparin lock flush 100 unit/mL  500 Units Intracatheter Once PRN [name], MD      . oxaliplatin (ELOXATIN) 70 mg in dextrose 5 % 500 mL chemo infusion  40 mg/m2 (Treatment Plan Recorded) Intravenous Once [name], MD      . sodium chloride flush (NS) 0.9 % injection 10 mL  10 mL Intracatheter PRN [name], MD        PHYSICAL EXAMINATION: ECOG PERFORMANCE STATUS: 1 - Symptomatic but completely ambulatory  Vitals:   04/19/19 0834  BP: (!) 179/73  Pulse: 73  Resp: 20  Temp: 98.3 F (36.8 C)  SpO2: 100%   Filed Weights   04/19/19 0834  Weight: 139 lb 14.4 oz (63.5 kg)   Due to COVID19 we will limit examination to appearance. Patient had no complaints.    GENERAL:alert, no distress and comfortable SKIN: skin color normal, no rashes or significant lesions EYES: normal, Conjunctiva are pink and non-injected, sclera clear  NEURO: alert & oriented x 3 with fluent speech  LABORATORY DATA:  I have reviewed the data as listed CBC Latest Ref Rng & Units 04/19/2019 04/05/2019 03/23/2019  WBC 4.0 - 10.5 K/uL 5.2 5.6 5.3  Hemoglobin 12.0 - 15.0 g/dL 9.6(L) 10.3(L) 7.3(L)  Hematocrit 36.0 - 46.0 % 29.8(L) 31.3(L) 23.3(L)  Platelets 150 - 400 K/uL 128(L) 99(L) 164     CMP Latest Ref Rng & Units 04/19/2019 04/05/2019 03/23/2019  Glucose 70 - 99 mg/dL 117(H) 172(H) 168(H)  BUN 8 - 23 mg/dL '18 17 19  '$ Creatinine 0.44 - 1.00 mg/dL 1.21(H) 1.23(H) 1.19(H)  Sodium 135 - 145 mmol/L 141 140 144  Potassium 3.5 - 5.1 mmol/L 4.0 3.8 4.0  Chloride 98 - 111 mmol/L 106 106 109  CO2 22 - 32 mmol/L '25 24 25  '$ Calcium 8.9 - 10.3 mg/dL 8.8(L) 8.5(L) 8.3(L)  Total Protein 6.5 - 8.1 g/dL 7.0 6.7 6.8  Total Bilirubin 0.3 - 1.2 mg/dL 0.4 0.4 0.2(L)  Alkaline Phos 38 - 126 U/L 69 70 61  AST 15 - 41 U/L 46(H) 44(H) 43(H)  ALT 0 - 44 U/L '15 18 15      '$ RADIOGRAPHIC STUDIES: I have personally reviewed the radiological images as listed and agreed with the findings in the report. No results found.   ASSESSMENT & PLAN:  OLISA QUESNEL is a 79 y.o. female with   1.Intrahepatic cholangiocarcinoma, cT1N0M1,with peritoneal metastasis, MSS, IDH1 mutation (+) -She was diagnosed in 08/2018. Her biopsy of her liver mass shows adenocarcinoma,most consistent withcholangiocarcinoma.Her EGD/Colonoscopy from 7/10/20was negativeand 09/29/18 PET scanshowedno evidence of distant metastasis. -She was brought to OR on 9/3,unfortunately she was found to have peritoneal metastasis to right diaphragm and surgery was aborted. -Given her metastatic cancer and ineligibility forsurgery, Istarted her onsystemicchemo withFirst line chemoOxaliplatin and gemcitabine q2weeksto control her  disease.She started on 11/30/18. -HerFO results showed MSI stable disease, IDH mutation positive.I would consider IDH 1 inhibitor in the future.Not a candidate for immunotherapy.  -S/p C10 she has developed taste change and loss of taste. She also notes Chemo brain.  She has mild progression of neuropathy mainly in her feet. I will further reduce Oxaliplatin starting with C11. If this continues to progress will stop Oxaliplatin.  -I discussed she has had good response to this intensive regimen and we may start single agent gemcitabine maintenance therapy soon to continue to control her disease and manage her quality of life. She was very concerned that her cancer will progress if we back of her chemo, but agree to have lower dose oxaliplatin today   -Labs reviewed, with anemia and mild thrombocytopaenia. Overall adequate to proceed with C11 Gemcitabine and dose reduced Oxaliplatin.  -F/u in  2 weeks   2. Anemia -Secondary to chemo  -I previously recommended she start prenatal vitamin. -With recent worsened anemia she had blood transfusion weak of 03/29/19  3. H/o right breast cancer, Geneticsnegative -s/p mastectomy in 1992, per patient did not require adjuvant therapy  -followed by Dr. Jana Hakim -Her genetic testing wasnegativefor pathogeneticmutations. She did have VUS of gene APC.  4.Comorbidities:DM, HTN, hiatal hernia, GERD, renal artery stenosis -f/u with PCP -We will monitor her blood pressure and glucose level closely during her chemo. We will adjust her meds as needed -Given she is on steroids I encouraged her to watch her BG at home daily and to reduce sugar intake the week of chemo.She did not tolerate Glucerna, she will continue low sugar protein shakes. -She plans to receive BP Monitor from insurance to monitor BP at home.  5. Family support  -she has a son and daughter who are often busy but help as needed.  -She notes they are aware of her condition. I  will update her children today.   6.Goal of care discussion  -The patient understands the goal of care is palliative. -she is full code now  7.Hypertension, uncontrolled -She did not take Medication before coming to clinic but did take in clinic.  -Per patient her BP is usually well controlled.  8. Neuropathy, G1-2 -secondary to Oxaliplatin  -She has numbness of feet when sitting and tingling when walking. She only has cold sensitivity of her hands.  -I previously reduced Oxaliplatin dose to '60mg'$ /m2starting with C9. Given progression of neuropathy in feet I will further reduce starting with C11. If continues to progress will stop Oxaliplatin.  -I previously called in '100mg'$  Gabapentin, she has not started yet due to side effects.   9. Taste change, weight loss  -With recent taste change and loss of taste she has lost 5-6 pounds in 2 weeks.  -She does not try to add more salt or sugar in food given her DM and HTN.  -I discussed if she is eating less she should take more nutritional supplements daily such as Glucerna 2 bottles a day.  -I will also set up dietician consult, she is agreeable.   PLAN: -Dietician consult due to weight loss  -I encourage her to start Gabapentin  -Labs reviewed and adequate to proceed with C11 Gemcitabine and lowered dose Oxaliplatin due to neuropathy  -Lab, flush, f/uandGemcitabine and Oxaliplatinin 2 and 4 weeks  -called her daughter Donna May, no answer, left a message with brief updates    No problem-specific Assessment & Plan notes found for this encounter.   No orders of the defined types were placed in this encounter.  All questions were answered. The patient knows to call the clinic with any problems, questions or concerns. No barriers to learning was detected. The total time spent in the appointment was 30 minutes.  [name], MD 04/19/2019   I, Joslyn Devon, am acting as scribe for [name], MD.   I have reviewed the above  documentation for accuracy and completeness, and I agree with the above.

## 2019-04-18 DIAGNOSIS — H3562 Retinal hemorrhage, left eye: Secondary | ICD-10-CM | POA: Diagnosis not present

## 2019-04-18 DIAGNOSIS — H20012 Primary iridocyclitis, left eye: Secondary | ICD-10-CM | POA: Diagnosis not present

## 2019-04-19 ENCOUNTER — Inpatient Hospital Stay: Payer: Medicare Other

## 2019-04-19 ENCOUNTER — Inpatient Hospital Stay: Payer: Medicare Other | Attending: Adult Health

## 2019-04-19 ENCOUNTER — Other Ambulatory Visit: Payer: Self-pay

## 2019-04-19 ENCOUNTER — Ambulatory Visit: Payer: Medicare Other | Admitting: Nutrition

## 2019-04-19 ENCOUNTER — Telehealth: Payer: Self-pay | Admitting: Hematology

## 2019-04-19 ENCOUNTER — Encounter: Payer: Self-pay | Admitting: Hematology

## 2019-04-19 ENCOUNTER — Inpatient Hospital Stay (HOSPITAL_BASED_OUTPATIENT_CLINIC_OR_DEPARTMENT_OTHER): Payer: Medicare Other | Admitting: Hematology

## 2019-04-19 VITALS — BP 179/73 | HR 73 | Temp 98.3°F | Resp 20 | Ht 65.0 in | Wt 139.9 lb

## 2019-04-19 DIAGNOSIS — Z5111 Encounter for antineoplastic chemotherapy: Secondary | ICD-10-CM | POA: Insufficient documentation

## 2019-04-19 DIAGNOSIS — C786 Secondary malignant neoplasm of retroperitoneum and peritoneum: Secondary | ICD-10-CM | POA: Diagnosis not present

## 2019-04-19 DIAGNOSIS — D6481 Anemia due to antineoplastic chemotherapy: Secondary | ICD-10-CM | POA: Insufficient documentation

## 2019-04-19 DIAGNOSIS — C221 Intrahepatic bile duct carcinoma: Secondary | ICD-10-CM

## 2019-04-19 DIAGNOSIS — Z95828 Presence of other vascular implants and grafts: Secondary | ICD-10-CM

## 2019-04-19 DIAGNOSIS — C50919 Malignant neoplasm of unspecified site of unspecified female breast: Secondary | ICD-10-CM | POA: Diagnosis not present

## 2019-04-19 LAB — CBC WITH DIFFERENTIAL (CANCER CENTER ONLY)
Abs Immature Granulocytes: 0.01 10*3/uL (ref 0.00–0.07)
Basophils Absolute: 0 10*3/uL (ref 0.0–0.1)
Basophils Relative: 0 %
Eosinophils Absolute: 0.1 10*3/uL (ref 0.0–0.5)
Eosinophils Relative: 2 %
HCT: 29.8 % — ABNORMAL LOW (ref 36.0–46.0)
Hemoglobin: 9.6 g/dL — ABNORMAL LOW (ref 12.0–15.0)
Immature Granulocytes: 0 %
Lymphocytes Relative: 30 %
Lymphs Abs: 1.6 10*3/uL (ref 0.7–4.0)
MCH: 29.6 pg (ref 26.0–34.0)
MCHC: 32.2 g/dL (ref 30.0–36.0)
MCV: 92 fL (ref 80.0–100.0)
Monocytes Absolute: 1.3 10*3/uL — ABNORMAL HIGH (ref 0.1–1.0)
Monocytes Relative: 25 %
Neutro Abs: 2.2 10*3/uL (ref 1.7–7.7)
Neutrophils Relative %: 43 %
Platelet Count: 128 10*3/uL — ABNORMAL LOW (ref 150–400)
RBC: 3.24 MIL/uL — ABNORMAL LOW (ref 3.87–5.11)
RDW: 19.1 % — ABNORMAL HIGH (ref 11.5–15.5)
WBC Count: 5.2 10*3/uL (ref 4.0–10.5)
nRBC: 0 % (ref 0.0–0.2)

## 2019-04-19 LAB — CMP (CANCER CENTER ONLY)
ALT: 15 U/L (ref 0–44)
AST: 46 U/L — ABNORMAL HIGH (ref 15–41)
Albumin: 3.4 g/dL — ABNORMAL LOW (ref 3.5–5.0)
Alkaline Phosphatase: 69 U/L (ref 38–126)
Anion gap: 10 (ref 5–15)
BUN: 18 mg/dL (ref 8–23)
CO2: 25 mmol/L (ref 22–32)
Calcium: 8.8 mg/dL — ABNORMAL LOW (ref 8.9–10.3)
Chloride: 106 mmol/L (ref 98–111)
Creatinine: 1.21 mg/dL — ABNORMAL HIGH (ref 0.44–1.00)
GFR, Est AFR Am: 49 mL/min — ABNORMAL LOW (ref >60)
GFR, Estimated: 42 mL/min — ABNORMAL LOW (ref >60)
Glucose, Bld: 117 mg/dL — ABNORMAL HIGH (ref 70–99)
Potassium: 4 mmol/L (ref 3.5–5.1)
Sodium: 141 mmol/L (ref 135–145)
Total Bilirubin: 0.4 mg/dL (ref 0.3–1.2)
Total Protein: 7 g/dL (ref 6.5–8.1)

## 2019-04-19 MED ORDER — OXALIPLATIN CHEMO INJECTION 100 MG/20ML
40.0000 mg/m2 | Freq: Once | INTRAVENOUS | Status: AC
  Administered 2019-04-19: 11:00:00 70 mg via INTRAVENOUS
  Filled 2019-04-19: qty 14

## 2019-04-19 MED ORDER — PALONOSETRON HCL INJECTION 0.25 MG/5ML
INTRAVENOUS | Status: AC
  Filled 2019-04-19: qty 5

## 2019-04-19 MED ORDER — DEXTROSE 5 % IV SOLN
Freq: Once | INTRAVENOUS | Status: AC
  Filled 2019-04-19: qty 250

## 2019-04-19 MED ORDER — DEXAMETHASONE SODIUM PHOSPHATE 10 MG/ML IJ SOLN
INTRAMUSCULAR | Status: AC
  Filled 2019-04-19: qty 1

## 2019-04-19 MED ORDER — HEPARIN SOD (PORK) LOCK FLUSH 100 UNIT/ML IV SOLN
500.0000 [IU] | Freq: Once | INTRAVENOUS | Status: AC | PRN
  Administered 2019-04-19: 500 [IU]
  Filled 2019-04-19: qty 5

## 2019-04-19 MED ORDER — SODIUM CHLORIDE 0.9 % IV SOLN
1000.0000 mg/m2 | Freq: Once | INTRAVENOUS | Status: AC
  Administered 2019-04-19: 1748 mg via INTRAVENOUS
  Filled 2019-04-19: qty 45.97

## 2019-04-19 MED ORDER — PALONOSETRON HCL INJECTION 0.25 MG/5ML
0.2500 mg | Freq: Once | INTRAVENOUS | Status: AC
  Administered 2019-04-19: 0.25 mg via INTRAVENOUS

## 2019-04-19 MED ORDER — SODIUM CHLORIDE 0.9% FLUSH
10.0000 mL | INTRAVENOUS | Status: DC | PRN
  Administered 2019-04-19: 13:00:00 10 mL
  Filled 2019-04-19: qty 10

## 2019-04-19 MED ORDER — SODIUM CHLORIDE 0.9% FLUSH
10.0000 mL | Freq: Once | INTRAVENOUS | Status: AC
  Administered 2019-04-19: 08:00:00 10 mL
  Filled 2019-04-19: qty 10

## 2019-04-19 MED ORDER — DEXAMETHASONE SODIUM PHOSPHATE 10 MG/ML IJ SOLN
10.0000 mg | Freq: Once | INTRAMUSCULAR | Status: AC
  Administered 2019-04-19: 10 mg via INTRAVENOUS

## 2019-04-19 MED ORDER — SODIUM CHLORIDE 0.9 % IV SOLN
Freq: Once | INTRAVENOUS | Status: AC
  Filled 2019-04-19: qty 250

## 2019-04-19 NOTE — Patient Instructions (Signed)

## 2019-04-19 NOTE — Patient Instructions (Signed)
[facility] Discharge Instructions for Patients Receiving Chemotherapy  Today you received the following chemotherapy agents: Gemcitabine (Gemzar) and Oxaliplatin (Eloxatin)  To help prevent nausea and vomiting after your treatment, we encourage you to take your nausea medication as directed.   If you develop nausea and vomiting that is not controlled by your nausea medication, call the clinic.   BELOW ARE SYMPTOMS THAT SHOULD BE REPORTED IMMEDIATELY:  *FEVER GREATER THAN 100.5 F  *CHILLS WITH OR WITHOUT FEVER  NAUSEA AND VOMITING THAT IS NOT CONTROLLED WITH YOUR NAUSEA MEDICATION  *UNUSUAL SHORTNESS OF BREATH  *UNUSUAL BRUISING OR BLEEDING  TENDERNESS IN MOUTH AND THROAT WITH OR WITHOUT PRESENCE OF ULCERS  *URINARY PROBLEMS  *BOWEL PROBLEMS  UNUSUAL RASH Items with * indicate a potential emergency and should be followed up as soon as possible.  Feel free to call the clinic should you have any questions or concerns. The clinic phone number is [phone].  Please show the [facility] at check-in to the Emergency Department and triage nurse.  Coronavirus (COVID-19) Are you at risk?  Are you at risk for the Coronavirus (COVID-19)?  To be considered HIGH RISK for Coronavirus (COVID-19), you have to meet the following criteria:  . Traveled to [place], [place], [place], [place] or [place]; or in the [place] to [place], [place], [place], or [place]; and have fever, cough, and shortness of breath within the last 2 weeks of travel OR . Been in close contact with a person diagnosed with COVID-19 within the last 2 weeks and have fever, cough, and shortness of breath . IF YOU DO NOT MEET THESE CRITERIA, YOU ARE CONSIDERED LOW RISK FOR COVID-19.  What to do if you are HIGH RISK for COVID-19?  Marland Kitchen If you are having a medical emergency, call 911. . Seek medical care right away. Before you go to a doctor's office, urgent care or emergency department,  call ahead and tell them about your recent travel, contact with someone diagnosed with COVID-19, and your symptoms. You should receive instructions from your physician's office regarding next steps of care.  . When you arrive at healthcare provider, tell the healthcare staff immediately you have returned from visiting [place], [place], [place], [place] or [place]; or traveled in the [place] to [place], [place], [place], or [place]; in the last two weeks or you have been in close contact with a person diagnosed with COVID-19 in the last 2 weeks.   . Tell the health care staff about your symptoms: fever, cough and shortness of breath. . After you have been seen by a medical provider, you will be either: o Tested for (COVID-19) and discharged home on quarantine except to seek medical care if symptoms worsen, and asked to  - Stay home and avoid contact with others until you get your results (4-5 days)  - Avoid travel on public transportation if possible (such as bus, train, or airplane) or o Sent to the Emergency Department by EMS for evaluation, COVID-19 testing, and possible admission depending on your condition and test results.  What to do if you are LOW RISK for COVID-19?  Reduce your risk of any infection by using the same precautions used for avoiding the common cold or flu:  Marland Kitchen Wash your hands often with soap and warm water for at least 20 seconds.  If soap and water are not readily available, use an alcohol-based hand sanitizer with at least 60% alcohol.  Marland Kitchen  If coughing or sneezing, cover your mouth and nose by coughing or sneezing into the elbow areas of your shirt or coat, into a tissue or into your sleeve (not your hands). . Avoid shaking hands with others and consider head nods or verbal greetings only. . Avoid touching your eyes, nose, or mouth with unwashed hands.  . Avoid close contact with people who are sick. . Avoid places or events with large numbers of people in one  location, like concerts or sporting events. . Carefully consider travel plans you have or are making. . If you are planning any travel outside or inside the [place], visit the CDC's Travelers' Health webpage for the latest health notices. . If you have some symptoms but not all symptoms, continue to monitor at home and seek medical attention if your symptoms worsen. . If you are having a medical emergency, call 911.   ADDITIONAL HEALTHCARE OPTIONS FOR PATIENTS  [facility] / e-Visit: [email or web address]         [facility]: [phone]  [facility]: [phone]                   [facility]: [phone]   

## 2019-04-19 NOTE — Telephone Encounter (Signed)
Per 2/3 los Dietician consult in 1-2 weeks, appt already scheduled.

## 2019-04-19 NOTE — Progress Notes (Signed)
Patient was referred by MD secondary to taste changes and poor appetite. Patient is an 79 year old female diagnosed with cholangiocarcinoma with peritoneal metastases. She is a patient of Dr. Burr Medico. She is receiving oxaliplatin and gemcitabine.  Past medical history includes breast cancer, chronic kidney disease stage III, depression, diabetes, GERD, hyperlipidemia, and hypertension.  Medications include Glucophage, omega-3 fatty acids, multivitamin, Compazine, Crestor, and Januvia.  Labs include glucose 117, creatinine 1.21, and albumin 3.4.  Height: 5 feet 5 inches. Weight: 139.9 pounds. Usual body weight: 151 pounds December 9. BMI: 23.28.  Patient endorses recent weight loss.  She has lost approximately 7% of her body weight in less than 2 months. Patient reports she has occasional constipation.  She does not like taking laxatives. She denies nausea and vomiting. She reports taste alterations and decreased appetite. Patient is drinking Premier protein nutrition supplements. She does not follow a low concentrated sweets diet.  Nutrition diagnosis: Inadequate oral intake related to cholangiocarcinoma as evidenced by 7% weight loss.  Intervention: Patient educated to try to consume smaller amounts of food more often. Recommended patient increase oral nutrition supplements twice daily between meals. Provided samples. Educated patient on strategies for improving taste alterations.   Educated patient on strategies for improving constipation, including 4 ounces of prune juice daily. Encouraged increased fluid intake. Provided fact sheets.  Questions were answered.  Teach back method used.  Contact information provided.  Monitoring, evaluation, goals: Patient will tolerate increased calories and protein to minimize weight loss.  Next visit:Wednesday, February 17 during infusion.  **Disclaimer: This note was dictated with voice recognition software. Similar sounding words can  inadvertently be transcribed and this note may contain transcription errors which may not have been corrected upon publication of note.**

## 2019-04-27 NOTE — Progress Notes (Signed)
[place]   [phone] [phone]   Clinic Follow up Note   Patient Care Team: [name], MD as PCP - General (Family Medicine) [name], [name] Dad, MD as Consulting Physician (Oncology) [name], [name], MD as Consulting Physician (Cardiology) [name], MD as Consulting Physician (Obstetrics and Gynecology) [name], [name], NP as Nurse Practitioner (Hematology and Oncology) [facility] [name], MD as Consulting Physician (Hematology) [name], [name], MD as Consulting Physician (Gastroenterology) Virgina Evener, Dawn, RN (Inactive) as Oncology Nurse Navigator [name], MD as Consulting Physician (General Surgery)  Date of Service:  05/03/2019  CHIEF COMPLAINT: F/u ofcholangiocarcinomaof liver  SUMMARY OF ONCOLOGIC HISTORY: Oncology History  Malignant neoplasm of female breast ([place])  11/06/2018 Genetic Testing   Negative genetic testing on the [facility] Common Hereditary Cancers panel. A variant of uncertain significance was identified in one of her APC genes, called c.1243G>A (p.Ala415Thr).  The Common Hereditary Cancers Panel offered by [facility] includes sequencing and/or deletion duplication testing of the following 48 genes: APC, ATM, AXIN2, BARD1, BMPR1A, BRCA1, BRCA2, BRIP1, CDH1, CDK4, CDKN2A (p14ARF), CDKN2A (p16INK4a), CHEK2, CTNNA1, DICER1, EPCAM (Deletion/duplication testing only), GREM1 (promoter region deletion/duplication testing only), KIT, MEN1, MLH1, MSH2, MSH3, MSH6, MUTYH, NBN, NF1, NHTL1, PALB2, PDGFRA, PMS2, POLD1, POLE, PTEN, RAD50, RAD51C, RAD51D, RNF43, SDHB, SDHC, SDHD, SMAD4, SMARCA4. STK11, TP53, TSC1, TSC2, and VHL.  The following genes were evaluated for sequence changes only: SDHA and HOXB13 c.251G>A variant only.    Intrahepatic cholangiocarcinoma ([place])  09/07/2018 Imaging   CT Chest IMPRESSION: 1. New, enhancing mass involving segment 4 of the liver and fundus of  gallbladder is concerning for malignancy. This may represent either metastatic disease from breast cancer or neoplasm primary to the liver or hepatic biliary tree. Further evaluation with contrast enhanced CT of the abdomen and pelvis is recommended. 2. No findings to suggest metastatic disease within the chest. 3.  Aortic Atherosclerosis (ICD10-I70.0). 4. Coronary artery calcifications.   09/13/2018 Pathology Results   Diagnosis Liver, needle/core biopsy - ADENOCARCINOMA. Microscopic Comment Immunohistochemistry for CK7 is positive. CK20, TTF1, CDX-2, GATA-3, PAX 8, Qualitative ER, p63 and CK5/6 are negative. The provided clinical history of remote mammary carcinoma is noted. Based on the morphology and immunophenotype of the adenocarcinoma observed in this specimen, primary cholangiocarcinoma is favored. Clinical and radiologic correlation are  encouraged. Results reported to [employer] on 09/15/2018. Intradepartmental consultation (Dr. Vic Ripper).   09/13/2018 Initial Diagnosis   Cholangiocarcinoma ([place])   09/23/2018 Procedure   Colonoscopy by Dr. Havery Moros 09/23/18  IMPRESSION - Two 3 to 4 mm polyps in the ascending colon, removed with a cold snare. Resected and retrieved. - Five 3 to 5 mm polyps in the transverse colon, removed with a cold snare. Resected and retrieved. - One 5 mm polyp at the splenic flexure, removed with a cold snare. Resected and retrieved. - Three 3 to 5 mm polyps in the sigmoid colon, removed with a cold snare. Resected and retrieved. - The examination was otherwise normal. Upper Endopscy by Dr. Havery Moros 09/23/18  IMPRESSION - Esophagogastric landmarks identified. - 2 cm hiatal hernia. - Normal esophagus otherwise. - A single gastric polyp. Resected and retrieved. - Mild gastritis. Biopsied. - Normal duodenal bulb and second portion of the duodenum.   09/23/2018 Pathology Results   Diagnosis 09/23/18 1. Surgical [P], duodenum - BENIGN SMALL BOWEL  MUCOSA. - NO ACTIVE INFLAMMATION OR VILLOUS ATROPHY IDENTIFIED. 2. Surgical [P], stomach, polyp - HYPERPLASTIC POLYP(S). - THERE IS NO EVIDENCE OF  MALIGNANCY. 3. Surgical [P], gastric antrum and gastric body - CHRONIC INACTIVE GASTRITIS. - THERE IS NO EVIDENCE OF HELICOBACTER-PYLORI, DYSPLASIA, OR MALIGNANCY. - SEE COMMENT. 4. Surgical [P], colon, sigmoid, splenic flexure, transverse and ascending, polyp (9) - TUBULAR ADENOMA(S). - SESSILE SERRATED POLYP WITHOUT CYTOLOGIC DYSPLASIA. - HIGH GRADE DYSPLASIA IS NOT IDENTIFIED. 5. Surgical [P], colon, sigmoid, polyp (2) - HYPERPLASTIC POLYP(S). - THERE IS NO EVIDENCE OF MALIGNANCY.   09/23/2018 Cancer Staging   Staging form: Intrahepatic Bile Duct, AJCC 8th Edition - Clinical stage from 09/23/2018: Stage IB (cT1b, cN0, cM0) - Signed by [name], MD on 10/06/2018   09/29/2018 PET scan   PET 09/29/18 IMPRESSION: 1. Hypermetabolic mass in the RIGHT hepatic lobe consistent with biopsy proven adenocarcinoma. No additional liver metastasis. 2. No evidence of local breast cancer recurrence in the RIGHT breast or RIGHT axilla. 3. Mild bilateral hypermetabolic adrenal glands is favored benign hyperplasia. 4. No evidence of additional metastatic disease on skull base to thigh FDG PET scan.   11/06/2018 Genetic Testing   Negative genetic testing on the [facility] Common Hereditary Cancers panel. A variant of uncertain significance was identified in one of her APC genes, called c.1243G>A (p.Ala415Thr).  The Common Hereditary Cancers Panel offered by [facility] includes sequencing and/or deletion duplication testing of the following 48 genes: APC, ATM, AXIN2, BARD1, BMPR1A, BRCA1, BRCA2, BRIP1, CDH1, CDK4, CDKN2A (p14ARF), CDKN2A (p16INK4a), CHEK2, CTNNA1, DICER1, EPCAM (Deletion/duplication testing only), GREM1 (promoter region deletion/duplication testing only), KIT, MEN1, MLH1, MSH2, MSH3, MSH6, MUTYH, NBN, NF1, NHTL1, PALB2, PDGFRA, PMS2, POLD1, POLE,  PTEN, RAD50, RAD51C, RAD51D, RNF43, SDHB, SDHC, SDHD, SMAD4, SMARCA4. STK11, TP53, TSC1, TSC2, and VHL.  The following genes were evaluated for sequence changes only: SDHA and HOXB13 c.251G>A variant only.    11/17/2018 Pathology Results   Diagnosis 11/17/18 1. Soft tissue, biopsy, Diaphragmatic nodules - METASTATIC ADENOCARCINOMA, CONSISTENT WITH PATIENT'S CLINICAL HISTORY OF CHOLANGIOCARCINOMA. SEE NOTE 2. Liver, biopsy, Left - LIVER PARENCHYMA WITH A BENIGN FIBROTIC NODULE - NO EVIDENCE OF MALIGNANCY 3. Stomach, biopsy - BENIGN PAPILLARY MESOTHELIAL HYPERPLASIA - NO EVIDENCE OF MALIGNANCY   11/29/2018 Imaging   CT CAP WO Contrast  IMPRESSION: 1. Dominant liver mass appears grossly stable from 09/29/2018. Additional liver lesions are too small to characterize but were not shown to be hypermetabolic on PET. 2. Mild nodularity of both adrenal glands with associated hypermetabolism on 09/29/2018. Continued attention on follow-up exams is warranted. 3. Small right lower lobe nodules, stable from 09/07/2018. Again, attention on follow-up is recommended. 4. Trace bilateral pleural fluid. 5. Aortic atherosclerosis (ICD10-170.0). Coronary artery calcification. 6. Enlarged pulmonic trunk, indicative of pulmonary arterial hypertension.     11/30/2018 -  Chemotherapy   First line chemo Oxaliplatin and gemcitabine q2weeks starting 11/30/18. Stopped Oxaliplatin on 05/03/19 due to worsening neuropathy. May add Cisplatin with C13 on 3/3.    01/20/2019 Imaging   CT CAP IMPRESSION: restaging  1. Mild interval increase in size of dominant liver mass involving segment 4 and segment 5. 2. No significant or progressive adrenal nodularity identified to suggest metastatic disease. 3. Unchanged appearance of small right lower lobe and lingular lung nodules, nonspecific.   03/21/2019 PET scan   IMPRESSION: 1. Large right hepatic mass with SUV uptake near background hepatic activity, dramatic response to  therapy, also with decrease in size when compared to the prior study. 2. Signs of prior right mastectomy and axillary dissection as before. 3. Left adrenal activity in no longer above the level of activity seen in the  contralateral, right adrenal gland or in the liver. Attention on follow-up. 4. No new signs of disease.      CURRENT THERAPY:  First line chemoOxaliplatinand gemcitabine q2weeksstarting 11/30/18.Stopped Oxaliplatin on 05/03/19 due to worsening neuropathy. May add Cisplatin with C13 on 3/3.   INTERVAL HISTORY:  Donna May is here for a follow up of treatment. She presents to the clinic alone. Her daughter was called to be included in visit. She notes when she comes to any doctors office her BP is high. She denies chest pain or headaches lately. She is on amlodipine and HTCZ, metoprolol and losartan. She notes she is due to see her PCP soon. She notes she does not take her HTN meds every single day. She notes neuropathy of her feet most which can effect her balance. She also notes numbness of fingers which effect her writing she denies dropping things. This does not effect her sleep.     REVIEW OF SYSTEMS:   Constitutional: Denies fevers, chills or abnormal weight loss Eyes: Denies blurriness of vision Ears, nose, mouth, throat, and face: Denies mucositis or sore throat Respiratory: Denies cough, dyspnea or wheezes Cardiovascular: Denies palpitation, chest discomfort or lower extremity swelling Gastrointestinal:  Denies nausea, heartburn or change in bowel habits Skin: Denies abnormal skin rashes Lymphatics: Denies new lymphadenopathy or easy bruising Neurological: (+) Neuropathy of feet and numbness of fingers Behavioral/Psych: Mood is stable, no new changes  All other systems were reviewed with the patient and are negative.  MEDICAL HISTORY:  Past Medical History:  Diagnosis Date  . Anemia   . Anxiety   . Arthritis   . Breast cancer ([place]) 1992  . Cataract      Bilateral eyes - surgery to remove  . Chronic kidney disease    CKD stage 3  . Complication of anesthesia    "something they use make me itch for a couple of days."  . Depression   . Diabetes mellitus    type 2  . Duodenitis 01/18/2002  . Fainting spell   . Family history of breast cancer   . Family history of prostate cancer   . GERD (gastroesophageal reflux disease)   . Heart murmur    never has caused any problems  . Hiatal hernia 08/08/2008, 01/18/2002  . History of pneumonia    x 2  . Hyperlipidemia   . Hypertension   . Liver cancer ([place]) 08/2018  . Lymphedema 2017   Right arm  . Pneumonia    x 2  . UTI (lower urinary tract infection)     SURGICAL HISTORY: Past Surgical History:  Procedure Laterality Date  . AXILLARY SURGERY     cyst removal, right  . COLONOSCOPY  09/23/2018   Dr. Havery Moros - polyps  . EYE SURGERY Bilateral    cataracts to remove  . LAPAROSCOPY N/A 11/17/2018   Procedure: LAPAROSCOPY DIAGNOSTIC, INTRAOPERATIVE ULTRASOUND, PERITONEAL BIOPSIES;  Surgeon: [name], MD;  Location: [facility];  Service: General;  Laterality: N/A;  GENERAL AND EPIDURAL  . LIVER BIOPSY  08/2018   Dr. Lindwood Coke  . MASTECTOMY  1992   right, with flap  . NM MYOCAR PERF WALL MOTION  06/11/2009   Protocol:[place], post stress EF58%, EKG negative for ischemia, low risk  . PORTACATH PLACEMENT N/A 12/02/2018   Procedure: INSERTION PORT-A-CATH;  Surgeon: [name], MD;  Location: [facility];  Service: General;  Laterality: N/A;  . RECONSTRUCTION BREAST W/ TRAM FLAP Right   . TONSILLECTOMY    .  TRANSTHORACIC ECHOCARDIOGRAM  12/24/2009   LVEF =>55%, normal study  . UPPER GI ENDOSCOPY      I have reviewed the social history and family history with the patient and they are unchanged from previous note.  ALLERGIES:  has No Known Allergies.  MEDICATIONS:  Current Outpatient Medications  Medication Sig Dispense Refill  . amLODipine (NORVASC) 10 MG tablet TAKE 1 TABLET BY MOUTH  EVERY DAY 90 tablet 3  . aspirin 81 MG tablet Take 81 mg by mouth at bedtime.     . Calcium Carbonate-Vitamin D 600-200 MG-UNIT TABS Take 1 tablet by mouth daily.    Marland Kitchen CALCIUM-MAGNESIUM-VITAMIN D PO Take 1 tablet by mouth once a week.     . Cholecalciferol (VITAMIN D3) 2000 UNITS TABS Take 1 tablet by mouth once a week.     Marland Kitchen Echinacea-Goldenseal (ECHINACEA COMB/GOLDEN SEAL) CAPS Take by mouth.    . gabapentin (NEURONTIN) 100 MG capsule Take 1 capsule (100 mg total) by mouth at bedtime. 90 capsule 1  . hydrochlorothiazide (HYDRODIURIL) 25 MG tablet Take 1 tablet (25 mg total) by mouth daily. 90 tablet 3  . lansoprazole (PREVACID) 15 MG capsule TAKE 1 CAPSULE DAILY BEFOREBREAKFAST (NEED TO MAKE AN OFFICE VISIT FOR FURTHER   REFILLS) (Patient taking differently: Take 15 mg by mouth daily. ) 90 capsule 1  . lidocaine-prilocaine (EMLA) cream Apply to affected area once 30 g 3  . loratadine (CLARITIN) 10 MG tablet Take 10 mg by mouth daily as needed for allergies.    . metFORMIN (GLUCOPHAGE-XR) 500 MG 24 hr tablet Take 1 tablet (500 mg total) by mouth 2 (two) times daily before a meal. 180 tablet 1  . metoprolol succinate (TOPROL-XL) 100 MG 24 hr tablet Take 1 tablet (100 mg total) by mouth daily. Take with or immediately following a meal. 90 tablet 1  . Omega 3 1000 MG CAPS Take 2,000 mg by mouth daily.     . Prenatal Vit-Fe Fumarate-FA (MULTIVITAMIN-PRENATAL) 27-0.8 MG TABS tablet Take 1 tablet by mouth daily at 12 noon.    . raloxifene (EVISTA) 60 MG tablet TAKE 1 TABLET DAILY 90 tablet 0  . sitaGLIPtin (JANUVIA) 100 MG tablet Take 1 tablet (100 mg total) by mouth daily. 90 tablet 4  . triamcinolone cream (KENALOG) 0.1 % Apply 1 application topically 2 (two) times daily. (Patient taking differently: Apply 1 application topically 2 (two) times daily as needed (rash). ) 30 g 0  . valsartan (DIOVAN) 320 MG tablet Take 1 tablet (320 mg total) by mouth daily. 90 tablet 3  . glucose blood test strip 1  each by Other route 2 (two) times daily. Use Onetouch verio test strips as instructed to check blood sugar twice daily. 100 each 2  . HYDROcodone-acetaminophen (NORCO/VICODIN) 5-325 MG tablet Take 1 tablet by mouth every 6 (six) hours as needed for moderate pain. (Patient not taking: Reported on 05/03/2019) 10 tablet 0  . ketoconazole (NIZORAL) 2 % cream Apply 1 fingertip amount to each foot daily. (Patient not taking: Reported on 05/03/2019) 30 g 0  . ondansetron (ZOFRAN) 8 MG tablet Take 1 tablet (8 mg total) by mouth every 8 (eight) hours as needed for nausea or vomiting. (Patient not taking: Reported on 05/03/2019) 30 tablet 0  . prochlorperazine (COMPAZINE) 10 MG tablet Take 1 tablet (10 mg total) by mouth every 6 (six) hours as needed for nausea or vomiting. (Patient not taking: Reported on 05/03/2019) 30 tablet 0  . rosuvastatin (CRESTOR) 20 MG tablet  Take 1 tablet (20 mg total) by mouth daily. (Patient not taking: Reported on 05/03/2019) 90 tablet 1   Current Facility-Administered Medications  Medication Dose Route Frequency Provider Last Rate Last Admin  . triamcinolone acetonide (KENALOG) 10 MG/ML injection 10 mg  10 mg Other Once Harriet Masson, DPM       Facility-Administered Medications Ordered in Other Visits  Medication Dose Route Frequency Provider Last Rate Last Admin  . sodium chloride flush (NS) 0.9 % injection 10 mL  10 mL Intracatheter PRN [name], MD   10 mL at 05/03/19 1352    PHYSICAL EXAMINATION: ECOG PERFORMANCE STATUS: 1 - Symptomatic but completely ambulatory  Vitals:   05/03/19 1048 05/03/19 1050  BP: (!) 179/79 (!) 173/70  Pulse: 72   Resp: 17   Temp: 98 F (36.7 C)   SpO2: 100%    Filed Weights   05/03/19 1048  Weight: 144 lb 12.8 oz (65.7 kg)    GENERAL:alert, no distress and comfortable SKIN: skin color, texture, turgor are normal, no rashes or significant lesions EYES: normal, Conjunctiva are pink and non-injected, sclera clear  NECK: supple,  thyroid normal size, non-tender, without nodularity LYMPH:  no palpable lymphadenopathy in the cervical, axillary  LUNGS: clear to auscultation and percussion with normal breathing effort HEART: regular rate & rhythm and no murmurs and no lower extremity edema ABDOMEN:abdomen soft, non-tender and normal bowel sounds Musculoskeletal:no cyanosis of digits and no clubbing  NEURO: alert & oriented x 3 with fluent speech, no focal motor/sensory deficits  LABORATORY DATA:  I have reviewed the data as listed CBC Latest Ref Rng & Units 05/03/2019 04/19/2019 04/05/2019  WBC 4.0 - 10.5 K/uL 5.9 5.2 5.6  Hemoglobin 12.0 - 15.0 g/dL 8.2(L) 9.6(L) 10.3(L)  Hematocrit 36.0 - 46.0 % 25.7(L) 29.8(L) 31.3(L)  Platelets 150 - 400 K/uL 146(L) 128(L) 99(L)     CMP Latest Ref Rng & Units 05/03/2019 04/19/2019 04/05/2019  Glucose 70 - 99 mg/dL 138(H) 117(H) 172(H)  BUN 8 - 23 mg/dL '16 18 17  '$ Creatinine 0.44 - 1.00 mg/dL 1.09(H) 1.21(H) 1.23(H)  Sodium 135 - 145 mmol/L 143 141 140  Potassium 3.5 - 5.1 mmol/L 3.8 4.0 3.8  Chloride 98 - 111 mmol/L 109 106 106  CO2 22 - 32 mmol/L '26 25 24  '$ Calcium 8.9 - 10.3 mg/dL 7.6(L) 8.8(L) 8.5(L)  Total Protein 6.5 - 8.1 g/dL 6.5 7.0 6.7  Total Bilirubin 0.3 - 1.2 mg/dL 0.3 0.4 0.4  Alkaline Phos 38 - 126 U/L 59 69 70  AST 15 - 41 U/L 43(H) 46(H) 44(H)  ALT 0 - 44 U/L '18 15 18      '$ RADIOGRAPHIC STUDIES: I have personally reviewed the radiological images as listed and agreed with the findings in the report. No results found.   ASSESSMENT & PLAN:  JASON HAUGE is a 79 y.o. female with    1.Intrahepatic cholangiocarcinoma, cT1N0M1,with peritoneal metastasis, MSS, IDH1 mutation (+) -She was diagnosed in 08/2018. Her biopsy of her liver mass shows adenocarcinoma,most consistent withcholangiocarcinoma.Her EGD/Colonoscopy from 7/10/20was negativeand 09/29/18 PET scanshowedno evidence of distant metastasis. -She was brought to OR on 9/3,unfortunately she was found  to have peritoneal metastasis to right diaphragm and surgery was aborted. -Given her metastatic cancer and ineligibility forsurgery, Istarted her onsystemicchemo withFirst line chemoOxaliplatinand gemcitabine q2weeksto control her disease.She started on 11/30/18. -HerFO results showed MSI stable disease, IDH mutation positive.I would consider IDH 1 inhibitor in the future.Not a candidate for immunotherapy.  -Given her worsening neuropathy  despite Oxaliplatin dose reduction, I recommend stopping Oxaliplatin today with C12. I discussed the option of changing to gemcitabine single agent immunotherapy, or adding Cisplatin with next cycle 13 since her renal function has improved.  Patient is extremely concerned about her cancer progression when we de-escalate her chemo, and strongly prefer to do chemo combination then maintenance therapy.  I discussed the potential side effects of cisplatin hearing loss, neuropathy, hemorrhagic cystitis, and a longer duration of infusion if we add cisplatin, after lengthy discussion, she agrees to proceed with gemcitabine and cisplatin from next cycle.  -Due to her advanced age, I will schedule cisplatin and gemcitabine every 3 weeks -I discussed maintaining her kidney function to continue Cisplatin. We spoke about balance of quality of life and disease control during treatment. She understands.  -Labs reviewed and shows anemia, plt 146K, BG 138, Cr 1.09, Ca 7.6. Overall adequate to proceed with Gemcitabine alone today.  -Plan to scan her in 06/2019.  -F/u in 2 weeks.    2. Anemia -Secondary to chemo  -Ipreviouslyrecommendedshe start prenatal vitamin. -With recent worsened anemia she had blood transfusion weak of 03/29/19  3. H/o right breast cancer, Geneticsnegative -s/p mastectomy in 1992, per patient did not require adjuvant therapy  -followed by Dr. Jana Hakim -Her genetic testing wasnegativefor pathogeneticmutations. She did have VUS of gene  APC.  4.Comorbidities:DM, HTN, hiatal hernia, GERD, renal artery stenosis -f/u with PCP -We will monitor her blood pressure and glucose level closely during her chemo. We will adjust her meds as needed -Given she is on steroids I encouraged her to watch her BG at home daily and to reduce sugar intake the week of chemo.She did not tolerate Glucerna, she will continue low sugar protein shakes.  5. Family support  -she has a son and daughter who are often busy but help as needed.  -She notes they are aware of her condition. I will update her children today.   6.Goal of care discussion  -The patient understands the goal of care is palliative. -she is full code now  7.Hypertension, uncontrolled -She is on amlodipine and HTCZ, metoprolol and losartan. I encouraged her to make sure she takes daily and f/u with PCP for possible dose adjustment given recent elevated BP in clinic.   8. Neuropathy, G1-2 -secondary to Oxaliplatin  -I previously reduced Oxaliplatin dose 2-3 times since C9. Given continued progression of neuropathy in feet and numbness of fingers will d/c Oxaliplatin and switch to Cisplatin.  -She is currently on '100mg'$  Gabapentin, I recommend she increase to '100mg'$  in the AM and '200mg'$  at night.   9. Taste change, weight loss  -With recent taste change and loss of taste she has lost 5-6 pounds in 2 weeks.  -She does not try to add more salt or sugar in food given her DM and HTN.  -I discussed if she is eating less she should take more nutritional supplements daily such as Glucerna 2 bottles a day.  -She will continue to f/u with Dietician.  -She has been able to gain some weight.    10. Elevated Cr  -Has improved lately. Cr 1.09 today (05/03/19) -She is eligible currently for Cisplatin, plan to start with next cycle.  -I strongly encouraged her to drink more water at least 40 ounces daily to maintain kidney function.    PLAN: -Increase Gabapentin to 100 mg am,  and '200mg'$  HS -Labs reviewed and adequate to proceed with C12 Gemcitabine alone today. Stop Oxaliplatin due to worsening neuropathy  -  Lab, flush, f/uandGemcitabine and Cisplatin in 2 and 4 weeks  -I called her daughter and spoke with her about the above change during her visit today     No problem-specific Assessment & Plan notes found for this encounter.   No orders of the defined types were placed in this encounter.  All questions were answered. The patient knows to call the clinic with any problems, questions or concerns. No barriers to learning was detected. The total time spent in the appointment was 40 minutes.     [name], MD 05/03/2019   I, Joslyn Devon, am acting as scribe for [name], MD.   I have reviewed the above documentation for accuracy and completeness, and I agree with the above.

## 2019-05-01 DIAGNOSIS — N1832 Chronic kidney disease, stage 3b: Secondary | ICD-10-CM | POA: Diagnosis not present

## 2019-05-01 DIAGNOSIS — E1122 Type 2 diabetes mellitus with diabetic chronic kidney disease: Secondary | ICD-10-CM | POA: Diagnosis not present

## 2019-05-01 DIAGNOSIS — I129 Hypertensive chronic kidney disease with stage 1 through stage 4 chronic kidney disease, or unspecified chronic kidney disease: Secondary | ICD-10-CM | POA: Diagnosis not present

## 2019-05-01 DIAGNOSIS — C221 Intrahepatic bile duct carcinoma: Secondary | ICD-10-CM | POA: Diagnosis not present

## 2019-05-03 ENCOUNTER — Inpatient Hospital Stay: Payer: Medicare Other

## 2019-05-03 ENCOUNTER — Encounter: Payer: Self-pay | Admitting: Hematology

## 2019-05-03 ENCOUNTER — Inpatient Hospital Stay (HOSPITAL_BASED_OUTPATIENT_CLINIC_OR_DEPARTMENT_OTHER): Payer: Medicare Other | Admitting: Hematology

## 2019-05-03 ENCOUNTER — Inpatient Hospital Stay: Payer: Medicare Other | Admitting: Nutrition

## 2019-05-03 ENCOUNTER — Other Ambulatory Visit: Payer: Self-pay

## 2019-05-03 VITALS — BP 173/70 | HR 72 | Temp 98.0°F | Resp 17 | Ht 65.0 in | Wt 144.8 lb

## 2019-05-03 DIAGNOSIS — C221 Intrahepatic bile duct carcinoma: Secondary | ICD-10-CM

## 2019-05-03 DIAGNOSIS — D6481 Anemia due to antineoplastic chemotherapy: Secondary | ICD-10-CM | POA: Diagnosis not present

## 2019-05-03 DIAGNOSIS — Z95828 Presence of other vascular implants and grafts: Secondary | ICD-10-CM

## 2019-05-03 DIAGNOSIS — C50919 Malignant neoplasm of unspecified site of unspecified female breast: Secondary | ICD-10-CM | POA: Diagnosis not present

## 2019-05-03 DIAGNOSIS — Z5111 Encounter for antineoplastic chemotherapy: Secondary | ICD-10-CM | POA: Diagnosis not present

## 2019-05-03 DIAGNOSIS — C786 Secondary malignant neoplasm of retroperitoneum and peritoneum: Secondary | ICD-10-CM | POA: Diagnosis not present

## 2019-05-03 LAB — CBC WITH DIFFERENTIAL (CANCER CENTER ONLY)
Abs Immature Granulocytes: 0.02 10*3/uL (ref 0.00–0.07)
Basophils Absolute: 0 10*3/uL (ref 0.0–0.1)
Basophils Relative: 0 %
Eosinophils Absolute: 0.1 10*3/uL (ref 0.0–0.5)
Eosinophils Relative: 2 %
HCT: 25.7 % — ABNORMAL LOW (ref 36.0–46.0)
Hemoglobin: 8.2 g/dL — ABNORMAL LOW (ref 12.0–15.0)
Immature Granulocytes: 0 %
Lymphocytes Relative: 31 %
Lymphs Abs: 1.8 10*3/uL (ref 0.7–4.0)
MCH: 30 pg (ref 26.0–34.0)
MCHC: 31.9 g/dL (ref 30.0–36.0)
MCV: 94.1 fL (ref 80.0–100.0)
Monocytes Absolute: 1.1 10*3/uL — ABNORMAL HIGH (ref 0.1–1.0)
Monocytes Relative: 19 %
Neutro Abs: 2.8 10*3/uL (ref 1.7–7.7)
Neutrophils Relative %: 48 %
Platelet Count: 146 10*3/uL — ABNORMAL LOW (ref 150–400)
RBC: 2.73 MIL/uL — ABNORMAL LOW (ref 3.87–5.11)
RDW: 20.6 % — ABNORMAL HIGH (ref 11.5–15.5)
WBC Count: 5.9 10*3/uL (ref 4.0–10.5)
nRBC: 0 % (ref 0.0–0.2)

## 2019-05-03 LAB — CMP (CANCER CENTER ONLY)
ALT: 18 U/L (ref 0–44)
AST: 43 U/L — ABNORMAL HIGH (ref 15–41)
Albumin: 3.1 g/dL — ABNORMAL LOW (ref 3.5–5.0)
Alkaline Phosphatase: 59 U/L (ref 38–126)
Anion gap: 8 (ref 5–15)
BUN: 16 mg/dL (ref 8–23)
CO2: 26 mmol/L (ref 22–32)
Calcium: 7.6 mg/dL — ABNORMAL LOW (ref 8.9–10.3)
Chloride: 109 mmol/L (ref 98–111)
Creatinine: 1.09 mg/dL — ABNORMAL HIGH (ref 0.44–1.00)
GFR, Est AFR Am: 56 mL/min — ABNORMAL LOW (ref >60)
GFR, Estimated: 48 mL/min — ABNORMAL LOW (ref >60)
Glucose, Bld: 138 mg/dL — ABNORMAL HIGH (ref 70–99)
Potassium: 3.8 mmol/L (ref 3.5–5.1)
Sodium: 143 mmol/L (ref 135–145)
Total Bilirubin: 0.3 mg/dL (ref 0.3–1.2)
Total Protein: 6.5 g/dL (ref 6.5–8.1)

## 2019-05-03 MED ORDER — SODIUM CHLORIDE 0.9% FLUSH
10.0000 mL | INTRAVENOUS | Status: DC | PRN
  Administered 2019-05-03: 10 mL
  Filled 2019-05-03: qty 10

## 2019-05-03 MED ORDER — SODIUM CHLORIDE 0.9% FLUSH
10.0000 mL | Freq: Once | INTRAVENOUS | Status: DC
  Filled 2019-05-03: qty 10

## 2019-05-03 MED ORDER — PROCHLORPERAZINE MALEATE 10 MG PO TABS
ORAL_TABLET | ORAL | Status: AC
  Filled 2019-05-03: qty 1

## 2019-05-03 MED ORDER — HEPARIN SOD (PORK) LOCK FLUSH 100 UNIT/ML IV SOLN
500.0000 [IU] | Freq: Once | INTRAVENOUS | Status: AC | PRN
  Administered 2019-05-03: 500 [IU]
  Filled 2019-05-03: qty 5

## 2019-05-03 MED ORDER — PROCHLORPERAZINE MALEATE 10 MG PO TABS
10.0000 mg | ORAL_TABLET | Freq: Once | ORAL | Status: AC
  Administered 2019-05-03: 10 mg via ORAL

## 2019-05-03 MED ORDER — SODIUM CHLORIDE 0.9 % IV SOLN
1000.0000 mg/m2 | Freq: Once | INTRAVENOUS | Status: AC
  Administered 2019-05-03: 1748 mg via INTRAVENOUS
  Filled 2019-05-03: qty 45.97

## 2019-05-03 MED ORDER — SODIUM CHLORIDE 0.9 % IV SOLN
Freq: Once | INTRAVENOUS | Status: AC
  Filled 2019-05-03: qty 250

## 2019-05-03 NOTE — Progress Notes (Signed)
Nutrition follow-up completed with patient during infusion for cholangiocarcinoma with peritoneal metastases. Weight improved and was documented as 144.8 pounds on February 17 increased from 139.9 pounds February 3. Patient reports food still does not taste as she remembers.  Reports adding a touch of salt or sugar did increase taste. Patient reports she enjoyed Glucerna but has bottles of Premier and boost at home which she would like to use up first.  Nutrition diagnosis: Inadequate oral intake improved.  Intervention: Patient educated to continue strategies for added calories and protein throughout the day minimizing weight loss. Oral nutrition supplements as tolerated. Enforced education regarding taste alterations. Questions were answered.  Monitoring, evaluation, goals: Patient will tolerate increased calories and protein to minimize weight loss.  Next visit: To be scheduled as needed.  Patient has my contact information for questions.  **Disclaimer: This note was dictated with voice recognition software. Similar sounding words can inadvertently be transcribed and this note may contain transcription errors which may not have been corrected upon publication of note.**

## 2019-05-03 NOTE — Patient Instructions (Signed)
[facility] Discharge Instructions for Patients Receiving Chemotherapy  Today you received the following chemotherapy agents: Gemcitabine (Gemzar)  To help prevent nausea and vomiting after your treatment, we encourage you to take your nausea medication as directed.   If you develop nausea and vomiting that is not controlled by your nausea medication, call the clinic.   BELOW ARE SYMPTOMS THAT SHOULD BE REPORTED IMMEDIATELY:  *FEVER GREATER THAN 100.5 F  *CHILLS WITH OR WITHOUT FEVER  NAUSEA AND VOMITING THAT IS NOT CONTROLLED WITH YOUR NAUSEA MEDICATION  *UNUSUAL SHORTNESS OF BREATH  *UNUSUAL BRUISING OR BLEEDING  TENDERNESS IN MOUTH AND THROAT WITH OR WITHOUT PRESENCE OF ULCERS  *URINARY PROBLEMS  *BOWEL PROBLEMS  UNUSUAL RASH Items with * indicate a potential emergency and should be followed up as soon as possible.  Feel free to call the clinic should you have any questions or concerns. The clinic phone number is [phone].  Please show the CHEMO ALERT CARD at check-in to the Emergency Department and triage nurse.  Coronavirus (COVID-19) Are you at risk?  Are you at risk for the Coronavirus (COVID-19)?  To be considered HIGH RISK for Coronavirus (COVID-19), you have to meet the following criteria:  . Traveled to [place], [place], [place], [place] or [place]; or in the [place] to [place], [place], [place], or [place]; and have fever, cough, and shortness of breath within the last 2 weeks of travel OR . Been in close contact with a person diagnosed with COVID-19 within the last 2 weeks and have fever, cough, and shortness of breath . IF YOU DO NOT MEET THESE CRITERIA, YOU ARE CONSIDERED LOW RISK FOR COVID-19.  What to do if you are HIGH RISK for COVID-19?  . If you are having a medical emergency, call 911. . Seek medical care right away. Before you go to a doctor's office, urgent care or emergency department, call ahead and tell them  about your recent travel, contact with someone diagnosed with COVID-19, and your symptoms. You should receive instructions from your physician's office regarding next steps of care.  . When you arrive at healthcare provider, tell the healthcare staff immediately you have returned from visiting [place], [place], [place], [place] or [place]; or traveled in the [place] to [place], [place], [place], or [place]; in the last two weeks or you have been in close contact with a person diagnosed with COVID-19 in the last 2 weeks.   . Tell the health care staff about your symptoms: fever, cough and shortness of breath. . After you have been seen by a medical provider, you will be either: o Tested for (COVID-19) and discharged home on quarantine except to seek medical care if symptoms worsen, and asked to  - Stay home and avoid contact with others until you get your results (4-5 days)  - Avoid travel on public transportation if possible (such as bus, train, or airplane) or o Sent to the Emergency Department by EMS for evaluation, COVID-19 testing, and possible admission depending on your condition and test results.  What to do if you are LOW RISK for COVID-19?  Reduce your risk of any infection by using the same precautions used for avoiding the common cold or flu:  . Wash your hands often with soap and warm water for at least 20 seconds.  If soap and water are not readily available, use an alcohol-based hand sanitizer with at least 60% alcohol.  . If coughing or   sneezing, cover your mouth and nose by coughing or sneezing into the elbow areas of your shirt or coat, into a tissue or into your sleeve (not your hands). . Avoid shaking hands with others and consider head nods or verbal greetings only. . Avoid touching your eyes, nose, or mouth with unwashed hands.  . Avoid close contact with people who are sick. . Avoid places or events with large numbers of people in one location, like concerts or  sporting events. . Carefully consider travel plans you have or are making. . If you are planning any travel outside or inside the [place], visit the CDC's Travelers' Health webpage for the latest health notices. . If you have some symptoms but not all symptoms, continue to monitor at home and seek medical attention if your symptoms worsen. . If you are having a medical emergency, call 911.   ADDITIONAL HEALTHCARE OPTIONS FOR PATIENTS  [facility] / e-Visit: [email or web address]         [facility]: [phone]  [facility]: [phone]                   [facility]: [phone]   

## 2019-05-04 ENCOUNTER — Telehealth: Payer: Self-pay | Admitting: Hematology

## 2019-05-04 NOTE — Telephone Encounter (Signed)
Scheduled appt per 2/17 los.  Will contact patient once treatment for 3/3 has been added.

## 2019-05-05 ENCOUNTER — Ambulatory Visit: Payer: Medicare Other

## 2019-05-08 NOTE — Addendum Note (Signed)
Addended by: Neysa Hotter on: 05/08/2019 12:21 PM   Modules accepted: Orders

## 2019-05-12 ENCOUNTER — Ambulatory Visit: Payer: Medicare Other | Attending: Internal Medicine

## 2019-05-12 DIAGNOSIS — Z23 Encounter for immunization: Secondary | ICD-10-CM | POA: Insufficient documentation

## 2019-05-12 NOTE — Progress Notes (Signed)
   Covid-19 Vaccination Clinic  Name:  Donna May    MRN: [id number] DOB: October 11, 1940  05/12/2019  [name] was observed post Covid-19 immunization for 15 minutes without incidence. She was provided with Vaccine Information Sheet and instruction to access the V-Safe system.   [name] was instructed to call 911 with any severe reactions post vaccine: Marland Kitchen Difficulty breathing  . Swelling of your face and throat  . A fast heartbeat  . A bad rash all over your body  . Dizziness and weakness    Immunizations Administered    Name Date Dose VIS Date Route   Moderna COVID-19 Vaccine 05/12/2019  9:06 AM 0.5 mL 02/14/2019 Intramuscular   Manufacturer: Moderna   Lot: [id number]   [place]: [id number]

## 2019-05-16 NOTE — Progress Notes (Signed)
[name]   [phone] [phone]) [phone]   Clinic Follow up Note   Patient Care Team: [name], MD as PCP - General (Family Medicine) [name], [name] Dad, MD as Consulting Physician (Oncology) [name], [name], MD as Consulting Physician (Cardiology) [name], MD as Consulting Physician (Obstetrics and Gynecology) [name], [name], NP as Nurse Practitioner (Hematology and Oncology) [facility] [name], MD as Consulting Physician (Hematology) [name], [name], MD as Consulting Physician (Gastroenterology) [name], RN (Inactive) as Oncology Nurse Navigator [name], MD as Consulting Physician (General Surgery) 05/17/2019  CHIEF COMPLAINT: F/u cholangiocarcinoma   SUMMARY OF ONCOLOGIC HISTORY: Oncology History  Malignant neoplasm of female breast ([place])  11/06/2018 Genetic Testing   Negative genetic testing on the [facility] Common Hereditary Cancers panel. A variant of uncertain significance was identified in one of her APC genes, called c.1243G>A (p.Ala415Thr).  The Common Hereditary Cancers Panel offered by [facility] includes sequencing and/or deletion duplication testing of the following 48 genes: APC, ATM, AXIN2, BARD1, BMPR1A, BRCA1, BRCA2, BRIP1, CDH1, CDK4, CDKN2A (p14ARF), CDKN2A (p16INK4a), CHEK2, CTNNA1, DICER1, EPCAM (Deletion/duplication testing only), GREM1 (promoter region deletion/duplication testing only), KIT, MEN1, MLH1, MSH2, MSH3, MSH6, MUTYH, NBN, NF1, NHTL1, PALB2, PDGFRA, PMS2, POLD1, POLE, PTEN, RAD50, RAD51C, RAD51D, RNF43, SDHB, SDHC, SDHD, SMAD4, SMARCA4. STK11, TP53, TSC1, TSC2, and VHL.  The following genes were evaluated for sequence changes only: SDHA and HOXB13 c.251G>A variant only.    Intrahepatic cholangiocarcinoma ([place])  09/07/2018 Imaging   CT Chest IMPRESSION: 1. New, enhancing mass involving segment 4 of the liver and fundus of gallbladder is concerning for malignancy.  This may represent either metastatic disease from breast cancer or neoplasm primary to the liver or hepatic biliary tree. Further evaluation with contrast enhanced CT of the abdomen and pelvis is recommended. 2. No findings to suggest metastatic disease within the chest. 3.  Aortic Atherosclerosis (ICD10-I70.0). 4. Coronary artery calcifications.   09/13/2018 Pathology Results   Diagnosis Liver, needle/core biopsy - ADENOCARCINOMA. Microscopic Comment Immunohistochemistry for CK7 is positive. CK20, TTF1, CDX-2, GATA-3, PAX 8, Qualitative ER, p63 and CK5/6 are negative. The provided clinical history of remote mammary carcinoma is noted. Based on the morphology and immunophenotype of the adenocarcinoma observed in this specimen, primary cholangiocarcinoma is favored. Clinical and radiologic correlation are  encouraged. Results reported to [employer] on 09/15/2018. Intradepartmental consultation (Dr. Vic Ripper).   09/13/2018 Initial Diagnosis   Cholangiocarcinoma ([place])   09/23/2018 Procedure   Colonoscopy by Dr. Havery Moros 09/23/18  IMPRESSION - Two 3 to 4 mm polyps in the ascending colon, removed with a cold snare. Resected and retrieved. - Five 3 to 5 mm polyps in the transverse colon, removed with a cold snare. Resected and retrieved. - One 5 mm polyp at the splenic flexure, removed with a cold snare. Resected and retrieved. - Three 3 to 5 mm polyps in the sigmoid colon, removed with a cold snare. Resected and retrieved. - The examination was otherwise normal. Upper Endopscy by Dr. Havery Moros 09/23/18  IMPRESSION - Esophagogastric landmarks identified. - 2 cm hiatal hernia. - Normal esophagus otherwise. - A single gastric polyp. Resected and retrieved. - Mild gastritis. Biopsied. - Normal duodenal bulb and second portion of the duodenum.   09/23/2018 Pathology Results   Diagnosis 09/23/18 1. Surgical [P], duodenum - BENIGN SMALL BOWEL MUCOSA. - NO ACTIVE INFLAMMATION OR VILLOUS  ATROPHY IDENTIFIED. 2. Surgical [P], stomach, polyp - HYPERPLASTIC POLYP(S). - THERE IS NO EVIDENCE OF MALIGNANCY. 3. Surgical [P], gastric  antrum and gastric body - CHRONIC INACTIVE GASTRITIS. - THERE IS NO EVIDENCE OF HELICOBACTER-PYLORI, DYSPLASIA, OR MALIGNANCY. - SEE COMMENT. 4. Surgical [P], colon, sigmoid, splenic flexure, transverse and ascending, polyp (9) - TUBULAR ADENOMA(S). - SESSILE SERRATED POLYP WITHOUT CYTOLOGIC DYSPLASIA. - HIGH GRADE DYSPLASIA IS NOT IDENTIFIED. 5. Surgical [P], colon, sigmoid, polyp (2) - HYPERPLASTIC POLYP(S). - THERE IS NO EVIDENCE OF MALIGNANCY.   09/23/2018 Cancer Staging   Staging form: Intrahepatic Bile Duct, AJCC 8th Edition - Clinical stage from 09/23/2018: Stage IB (cT1b, cN0, cM0) - Signed by [name], MD on 10/06/2018   09/29/2018 PET scan   PET 09/29/18 IMPRESSION: 1. Hypermetabolic mass in the RIGHT hepatic lobe consistent with biopsy proven adenocarcinoma. No additional liver metastasis. 2. No evidence of local breast cancer recurrence in the RIGHT breast or RIGHT axilla. 3. Mild bilateral hypermetabolic adrenal glands is favored benign hyperplasia. 4. No evidence of additional metastatic disease on skull base to thigh FDG PET scan.   11/06/2018 Genetic Testing   Negative genetic testing on the [facility] Common Hereditary Cancers panel. A variant of uncertain significance was identified in one of her APC genes, called c.1243G>A (p.Ala415Thr).  The Common Hereditary Cancers Panel offered by [facility] includes sequencing and/or deletion duplication testing of the following 48 genes: APC, ATM, AXIN2, BARD1, BMPR1A, BRCA1, BRCA2, BRIP1, CDH1, CDK4, CDKN2A (p14ARF), CDKN2A (p16INK4a), CHEK2, CTNNA1, DICER1, EPCAM (Deletion/duplication testing only), GREM1 (promoter region deletion/duplication testing only), KIT, MEN1, MLH1, MSH2, MSH3, MSH6, MUTYH, NBN, NF1, NHTL1, PALB2, PDGFRA, PMS2, POLD1, POLE, PTEN, RAD50, RAD51C, RAD51D, RNF43, SDHB, SDHC,  SDHD, SMAD4, SMARCA4. STK11, TP53, TSC1, TSC2, and VHL.  The following genes were evaluated for sequence changes only: SDHA and HOXB13 c.251G>A variant only.    11/17/2018 Pathology Results   Diagnosis 11/17/18 1. Soft tissue, biopsy, Diaphragmatic nodules - METASTATIC ADENOCARCINOMA, CONSISTENT WITH PATIENT'S CLINICAL HISTORY OF CHOLANGIOCARCINOMA. SEE NOTE 2. Liver, biopsy, Left - LIVER PARENCHYMA WITH A BENIGN FIBROTIC NODULE - NO EVIDENCE OF MALIGNANCY 3. Stomach, biopsy - BENIGN PAPILLARY MESOTHELIAL HYPERPLASIA - NO EVIDENCE OF MALIGNANCY   11/29/2018 Imaging   CT CAP WO Contrast  IMPRESSION: 1. Dominant liver mass appears grossly stable from 09/29/2018. Additional liver lesions are too small to characterize but were not shown to be hypermetabolic on PET. 2. Mild nodularity of both adrenal glands with associated hypermetabolism on 09/29/2018. Continued attention on follow-up exams is warranted. 3. Small right lower lobe nodules, stable from 09/07/2018. Again, attention on follow-up is recommended. 4. Trace bilateral pleural fluid. 5. Aortic atherosclerosis (ICD10-170.0). Coronary artery calcification. 6. Enlarged pulmonic trunk, indicative of pulmonary arterial hypertension.     11/30/2018 -  Chemotherapy   First line chemo Oxaliplatin and gemcitabine q2weeks starting 11/30/18. Stopped Oxaliplatin on 05/03/19 due to worsening neuropathy. May add Cisplatin with C13 on 3/3.    01/20/2019 Imaging   CT CAP IMPRESSION: restaging  1. Mild interval increase in size of dominant liver mass involving segment 4 and segment 5. 2. No significant or progressive adrenal nodularity identified to suggest metastatic disease. 3. Unchanged appearance of small right lower lobe and lingular lung nodules, nonspecific.   03/21/2019 PET scan   IMPRESSION: 1. Large right hepatic mass with SUV uptake near background hepatic activity, dramatic response to therapy, also with decrease in size when  compared to the prior study. 2. Signs of prior right mastectomy and axillary dissection as before. 3. Left adrenal activity in no longer above the level of activity seen in the contralateral, right adrenal gland or  in the liver. Attention on follow-up. 4. No new signs of disease.     CURRENT THERAPY:  First line chemoOxaliplatinand gemcitabine q2weeksstarting 11/30/18.Stopped Oxaliplatin on 05/03/19 due to worsening neuropathy. Added Cisplatin with C13 on 3/3.   INTERVAL HISTORY: Donna May returns for f/u and treatment as scheduled. Due to worsening neuropathy, Oxaliplatin was stopped with cycle 12, she received gemcitabine only on 05/03/19. She did not feel as well, but can't really tell me how. Denies appetite change or fatigue. No n/v/d. Manages chronic constipation with stool softener and laxative. No improvement in neuropathy, takes gabapentin 1 AM and 2 at night. Balance is off. She feels numbness all the way up to her vaginal area. Feels like she's "walking on rocks with bad footing." No fall. She is reluctant to drive now. Otherwise, denies fever, chills, cough, chest pain, dyspnea, bleeding, mucositis, or new pain. She has bloating when more constipated.    MEDICAL HISTORY:  Past Medical History:  Diagnosis Date  . Anemia   . Anxiety   . Arthritis   . Breast cancer ([place]) 1992  . Cataract    Bilateral eyes - surgery to remove  . Chronic kidney disease    CKD stage 3  . Complication of anesthesia    "something they use make me itch for a couple of days."  . Depression   . Diabetes mellitus    type 2  . Duodenitis 01/18/2002  . Fainting spell   . Family history of breast cancer   . Family history of prostate cancer   . GERD (gastroesophageal reflux disease)   . Heart murmur    never has caused any problems  . Hiatal hernia 08/08/2008, 01/18/2002  . History of pneumonia    x 2  . Hyperlipidemia   . Hypertension   . Liver cancer ([place]) 08/2018  . Lymphedema 2017   Right  arm  . Pneumonia    x 2  . UTI (lower urinary tract infection)     SURGICAL HISTORY: Past Surgical History:  Procedure Laterality Date  . AXILLARY SURGERY     cyst removal, right  . COLONOSCOPY  09/23/2018   Dr. Havery Moros - polyps  . EYE SURGERY Bilateral    cataracts to remove  . LAPAROSCOPY N/A 11/17/2018   Procedure: LAPAROSCOPY DIAGNOSTIC, INTRAOPERATIVE ULTRASOUND, PERITONEAL BIOPSIES;  Surgeon: [name], MD;  Location: [place] [facility] [place];  Service: General;  Laterality: N/A;  GENERAL AND EPIDURAL  . LIVER BIOPSY  08/2018   Dr. Lindwood Coke  . MASTECTOMY  1992   right, with flap  . NM MYOCAR PERF WALL MOTION  06/11/2009   Protocol:[place], post stress EF58%, EKG negative for ischemia, low risk  . PORTACATH PLACEMENT N/A 12/02/2018   Procedure: INSERTION PORT-A-CATH;  Surgeon: [name], MD;  Location: [place];  Service: General;  Laterality: N/A;  . RECONSTRUCTION BREAST W/ TRAM FLAP Right   . TONSILLECTOMY    . TRANSTHORACIC ECHOCARDIOGRAM  12/24/2009   LVEF =>55%, normal study  . UPPER GI ENDOSCOPY      I have reviewed the social history and family history with the patient and they are unchanged from previous note.  ALLERGIES:  has No Known Allergies.  MEDICATIONS:  Current Outpatient Medications  Medication Sig Dispense Refill  . amLODipine (NORVASC) 10 MG tablet TAKE 1 TABLET BY MOUTH EVERY DAY 90 tablet 3  . aspirin 81 MG tablet Take 81 mg by mouth at bedtime.     . Calcium Carbonate-Vitamin D 600-200 MG-UNIT TABS Take  1 tablet by mouth daily.    Marland Kitchen CALCIUM-MAGNESIUM-VITAMIN D PO Take 1 tablet by mouth once a week.     . Cholecalciferol (VITAMIN D3) 2000 UNITS TABS Take 1 tablet by mouth once a week.     Marland Kitchen Echinacea-Goldenseal (ECHINACEA COMB/GOLDEN SEAL) CAPS Take by mouth.    . gabapentin (NEURONTIN) 100 MG capsule Take 1 capsule (100 mg total) by mouth at bedtime. 90 capsule 1  . glucose blood test strip 1 each by Other route 2 (two) times daily. Use Onetouch verio test  strips as instructed to check blood sugar twice daily. 100 each 2  . hydrochlorothiazide (HYDRODIURIL) 25 MG tablet Take 1 tablet (25 mg total) by mouth daily. 90 tablet 3  . HYDROcodone-acetaminophen (NORCO/VICODIN) 5-325 MG tablet Take 1 tablet by mouth every 6 (six) hours as needed for moderate pain. 10 tablet 0  . ketoconazole (NIZORAL) 2 % cream Apply 1 fingertip amount to each foot daily. 30 g 0  . lansoprazole (PREVACID) 15 MG capsule TAKE 1 CAPSULE DAILY BEFOREBREAKFAST (NEED TO MAKE AN OFFICE VISIT FOR FURTHER   REFILLS) (Patient taking differently: Take 15 mg by mouth daily. ) 90 capsule 1  . lidocaine-prilocaine (EMLA) cream Apply to affected area once 30 g 3  . loratadine (CLARITIN) 10 MG tablet Take 10 mg by mouth daily as needed for allergies.    . metFORMIN (GLUCOPHAGE-XR) 500 MG 24 hr tablet Take 1 tablet (500 mg total) by mouth 2 (two) times daily before a meal. 180 tablet 1  . metoprolol succinate (TOPROL-XL) 100 MG 24 hr tablet Take 1 tablet (100 mg total) by mouth daily. Take with or immediately following a meal. 90 tablet 1  . Omega 3 1000 MG CAPS Take 2,000 mg by mouth daily.     . ondansetron (ZOFRAN) 8 MG tablet Take 1 tablet (8 mg total) by mouth every 8 (eight) hours as needed for nausea or vomiting. 30 tablet 0  . Prenatal Vit-Fe Fumarate-FA (MULTIVITAMIN-PRENATAL) 27-0.8 MG TABS tablet Take 1 tablet by mouth daily at 12 noon.    . prochlorperazine (COMPAZINE) 10 MG tablet Take 1 tablet (10 mg total) by mouth every 6 (six) hours as needed for nausea or vomiting. 30 tablet 0  . raloxifene (EVISTA) 60 MG tablet TAKE 1 TABLET DAILY 90 tablet 0  . rosuvastatin (CRESTOR) 20 MG tablet Take 1 tablet (20 mg total) by mouth daily. 90 tablet 1  . sitaGLIPtin (JANUVIA) 100 MG tablet Take 1 tablet (100 mg total) by mouth daily. 90 tablet 4  . triamcinolone cream (KENALOG) 0.1 % Apply 1 application topically 2 (two) times daily. (Patient taking differently: Apply 1 application topically  2 (two) times daily as needed (rash). ) 30 g 0  . valsartan (DIOVAN) 320 MG tablet Take 1 tablet (320 mg total) by mouth daily. 90 tablet 3   Current Facility-Administered Medications  Medication Dose Route Frequency Provider Last Rate Last Admin  . triamcinolone acetonide (KENALOG) 10 MG/ML injection 10 mg  10 mg Other Once Harriet Masson, DPM       Facility-Administered Medications Ordered in Other Visits  Medication Dose Route Frequency Provider Last Rate Last Admin  . CISplatin (PLATINOL) 35 mg in sodium chloride 0.9 % 250 mL chemo infusion  20 mg/m2 (Treatment Plan Recorded) Intravenous Once [name], MD      . gemcitabine (GEMZAR) 1,748 mg in sodium chloride 0.9 % 250 mL chemo infusion  1,000 mg/m2 (Treatment Plan Recorded) Intravenous Once [name], MD  592 mL/hr at 05/17/19 1350 1,748 mg at 05/17/19 1350  . heparin lock flush 100 unit/mL  500 Units Intracatheter Once PRN [name], MD      . sodium chloride flush (NS) 0.9 % injection 10 mL  10 mL Intracatheter PRN [name], MD        PHYSICAL EXAMINATION: ECOG PERFORMANCE STATUS: 1 - Symptomatic but completely ambulatory  Vitals:   05/17/19 0852  BP: (!) 149/63  Pulse: 76  Resp: 17  Temp: 98 F (36.7 C)  SpO2: 99%   Filed Weights   05/17/19 0852  Weight: 143 lb (64.9 kg)    GENERAL:alert, no distress and comfortable SKIN: no rash  EYES:  sclera clear NECK: without mass LUNGS: clear to auscultation with normal breathing effort HEART: regular rate & rhythm, no lower extremity edema ABDOMEN: abdomen soft, non-tender and normal bowel sounds NEURO: alert & oriented x 3 with fluent speech, altered gait PAC without erythema   LABORATORY DATA:  I have reviewed the data as listed CBC Latest Ref Rng & Units 05/17/2019 05/03/2019 04/19/2019  WBC 4.0 - 10.5 K/uL 7.2 5.9 5.2  Hemoglobin 12.0 - 15.0 g/dL 8.1(L) 8.2(L) 9.6(L)  Hematocrit 36.0 - 46.0 % 25.2(L) 25.7(L) 29.8(L)  Platelets 150 - 400 K/uL 166 146(L) 128(L)      CMP Latest Ref Rng & Units 05/17/2019 05/03/2019 04/19/2019  Glucose 70 - 99 mg/dL 115(H) 138(H) 117(H)  BUN 8 - 23 mg/dL '14 16 18  '$ Creatinine 0.44 - 1.00 mg/dL 1.20(H) 1.09(H) 1.21(H)  Sodium 135 - 145 mmol/L 140 143 141  Potassium 3.5 - 5.1 mmol/L 3.7 3.8 4.0  Chloride 98 - 111 mmol/L 105 109 106  CO2 22 - 32 mmol/L '25 26 25  '$ Calcium 8.9 - 10.3 mg/dL 8.6(L) 7.6(L) 8.8(L)  Total Protein 6.5 - 8.1 g/dL 6.8 6.5 7.0  Total Bilirubin 0.3 - 1.2 mg/dL 0.4 0.3 0.4  Alkaline Phos 38 - 126 U/L 54 59 69  AST 15 - 41 U/L 37 43(H) 46(H)  ALT 0 - 44 U/L '10 18 15      '$ RADIOGRAPHIC STUDIES: I have personally reviewed the radiological images as listed and agreed with the findings in the report. No results found.   ASSESSMENT & PLAN: LILLEY HUBBLE [id number]   1.Intrahepatic cholangiocarcinoma, cT1N0M1,with peritoneal metastasis, MSS, IDH1 mutation (+) -Diagnosed in 09/2018 biopsy of her liver mass shows adenocarcinoma,most consistent withcholangiocarcinoma -09/29/18 PET scanshowedno evidence of distant metastasis. -Unfortunately she was found to have peritoneal metastasis to right diaphragm and surgery was aborted. -HerCT AP from 9/15/20shows dominate liver mass stable to mild increase, mild nodularity of both adrenal glands which warrants being monitored.No visible peritoneal metastasis on CT scan. -Given her metastatic cancer and ineligibility forsurgery,she began first-line systemic chemotherapy for disease control. -FO resultsshowed MSI stable disease, IDH mutation positive.Consider IDH 1 inhibitor in the future.Not a candidate for immunotherapy -She began first line oxaliplatin (due to CKD) and gemcitabine every 2 weeks on 11/30/2018 -she responded very well to treatment and tolerated without significant toxicities except progressive neuropathy. Oxali was dose reduced and eventually stopped after cycle 11. She received single agent gemcitabine with cycle 12.   -She will start cisplatin plus gem today with cycle 13  2. H/o right breast cancer, Geneticsnegative -s/p mastectomy in 1992, per patient did not require adjuvant therapy  -previously followed by Dr. Jana Hakim -VUS of gene APC;genetics otherwise normal  3.Comorbidities:DM, HTN, hiatal hernia, GERD, renal artery stenosis -Follow-up with PCP, nephrology, and Dr. Dwyane Dee  -  Crfluctuates, overall much improved from June - Sept this year (1.78 at highest). -I encouraged her to hydrate and avoid nephrotoxic agents. -will monitor closely on cisplatin   4. Family support  -she has a son and daughterwho are supportive. -patient takes pride in remaining independent, but has become more reluctant to drive due to CIPN in her feet  5.Goal of care discussion  -Wefrequently discussthe goal of her treatment is palliative and that hercancer is not curable with chemo alone at this stage. The goal is to control her disease, prolong her life, and give her good quality of life. She understands -she is full code now  Disposition:  Ms. Gaulin appears stable. She completed 12 cycles of chemotherapy. She has tolerated very well except progressive neuropathy. She continues gabapentin which is helping. She declined PT for balance issues but will think about it. Labs reviewed. Cr 1.2, LFTs normal, CBC shows down trending Hgb 8.1. She is asymptomatic. She will proceed with cycle 13 chemo with gemcitabine and first dose cisplatin. She will return for lab and possible RBC transfusion next week. F/u and next cycle in 2 weeks.   No problem-specific Assessment & Plan notes found for this encounter.   No orders of the defined types were placed in this encounter.  All questions were answered. The patient knows to call the clinic with any problems, questions or concerns. No barriers to learning was detected.     Alla Feeling, NP 05/17/19

## 2019-05-17 ENCOUNTER — Encounter: Payer: Self-pay | Admitting: Nurse Practitioner

## 2019-05-17 ENCOUNTER — Other Ambulatory Visit: Payer: Self-pay | Admitting: Hematology

## 2019-05-17 ENCOUNTER — Other Ambulatory Visit: Payer: Self-pay

## 2019-05-17 ENCOUNTER — Inpatient Hospital Stay: Payer: Medicare Other

## 2019-05-17 ENCOUNTER — Other Ambulatory Visit: Payer: Medicare Other

## 2019-05-17 ENCOUNTER — Inpatient Hospital Stay (HOSPITAL_BASED_OUTPATIENT_CLINIC_OR_DEPARTMENT_OTHER): Payer: Medicare Other | Admitting: Nurse Practitioner

## 2019-05-17 ENCOUNTER — Ambulatory Visit: Payer: Medicare Other

## 2019-05-17 ENCOUNTER — Ambulatory Visit: Payer: Medicare Other | Admitting: Hematology

## 2019-05-17 ENCOUNTER — Inpatient Hospital Stay: Payer: Medicare Other | Attending: Adult Health

## 2019-05-17 VITALS — BP 149/63 | HR 76 | Temp 98.0°F | Resp 17 | Ht 65.0 in | Wt 143.0 lb

## 2019-05-17 DIAGNOSIS — C221 Intrahepatic bile duct carcinoma: Secondary | ICD-10-CM | POA: Diagnosis not present

## 2019-05-17 DIAGNOSIS — C786 Secondary malignant neoplasm of retroperitoneum and peritoneum: Secondary | ICD-10-CM | POA: Insufficient documentation

## 2019-05-17 DIAGNOSIS — D649 Anemia, unspecified: Secondary | ICD-10-CM | POA: Diagnosis not present

## 2019-05-17 DIAGNOSIS — Z5111 Encounter for antineoplastic chemotherapy: Secondary | ICD-10-CM | POA: Insufficient documentation

## 2019-05-17 DIAGNOSIS — Z95828 Presence of other vascular implants and grafts: Secondary | ICD-10-CM

## 2019-05-17 LAB — CMP (CANCER CENTER ONLY)
ALT: 10 U/L (ref 0–44)
AST: 37 U/L (ref 15–41)
Albumin: 3.2 g/dL — ABNORMAL LOW (ref 3.5–5.0)
Alkaline Phosphatase: 54 U/L (ref 38–126)
Anion gap: 10 (ref 5–15)
BUN: 14 mg/dL (ref 8–23)
CO2: 25 mmol/L (ref 22–32)
Calcium: 8.6 mg/dL — ABNORMAL LOW (ref 8.9–10.3)
Chloride: 105 mmol/L (ref 98–111)
Creatinine: 1.2 mg/dL — ABNORMAL HIGH (ref 0.44–1.00)
GFR, Est AFR Am: 49 mL/min — ABNORMAL LOW (ref >60)
GFR, Estimated: 43 mL/min — ABNORMAL LOW (ref >60)
Glucose, Bld: 115 mg/dL — ABNORMAL HIGH (ref 70–99)
Potassium: 3.7 mmol/L (ref 3.5–5.1)
Sodium: 140 mmol/L (ref 135–145)
Total Bilirubin: 0.4 mg/dL (ref 0.3–1.2)
Total Protein: 6.8 g/dL (ref 6.5–8.1)

## 2019-05-17 LAB — CBC WITH DIFFERENTIAL (CANCER CENTER ONLY)
Abs Immature Granulocytes: 0.02 10*3/uL (ref 0.00–0.07)
Basophils Absolute: 0 10*3/uL (ref 0.0–0.1)
Basophils Relative: 0 %
Eosinophils Absolute: 0.2 10*3/uL (ref 0.0–0.5)
Eosinophils Relative: 3 %
HCT: 25.2 % — ABNORMAL LOW (ref 36.0–46.0)
Hemoglobin: 8.1 g/dL — ABNORMAL LOW (ref 12.0–15.0)
Immature Granulocytes: 0 %
Lymphocytes Relative: 19 %
Lymphs Abs: 1.3 10*3/uL (ref 0.7–4.0)
MCH: 30 pg (ref 26.0–34.0)
MCHC: 32.1 g/dL (ref 30.0–36.0)
MCV: 93.3 fL (ref 80.0–100.0)
Monocytes Absolute: 1.1 10*3/uL — ABNORMAL HIGH (ref 0.1–1.0)
Monocytes Relative: 15 %
Neutro Abs: 4.5 10*3/uL (ref 1.7–7.7)
Neutrophils Relative %: 63 %
Platelet Count: 166 10*3/uL (ref 150–400)
RBC: 2.7 MIL/uL — ABNORMAL LOW (ref 3.87–5.11)
RDW: 21.5 % — ABNORMAL HIGH (ref 11.5–15.5)
WBC Count: 7.2 10*3/uL (ref 4.0–10.5)
nRBC: 0 % (ref 0.0–0.2)

## 2019-05-17 MED ORDER — POTASSIUM CHLORIDE 2 MEQ/ML IV SOLN
Freq: Once | INTRAVENOUS | Status: AC
  Filled 2019-05-17: qty 10

## 2019-05-17 MED ORDER — SODIUM CHLORIDE 0.9 % IV SOLN
150.0000 mg | Freq: Once | INTRAVENOUS | Status: AC
  Administered 2019-05-17: 150 mg via INTRAVENOUS
  Filled 2019-05-17: qty 5

## 2019-05-17 MED ORDER — SODIUM CHLORIDE 0.9% FLUSH
10.0000 mL | INTRAVENOUS | Status: DC | PRN
  Administered 2019-05-17: 10 mL
  Filled 2019-05-17: qty 10

## 2019-05-17 MED ORDER — PALONOSETRON HCL INJECTION 0.25 MG/5ML
INTRAVENOUS | Status: AC
  Filled 2019-05-17: qty 5

## 2019-05-17 MED ORDER — DEXAMETHASONE SODIUM PHOSPHATE 10 MG/ML IJ SOLN
INTRAMUSCULAR | Status: AC
  Filled 2019-05-17: qty 1

## 2019-05-17 MED ORDER — SODIUM CHLORIDE 0.9% FLUSH
10.0000 mL | Freq: Once | INTRAVENOUS | Status: AC
  Administered 2019-05-17: 10 mL
  Filled 2019-05-17: qty 10

## 2019-05-17 MED ORDER — HEPARIN SOD (PORK) LOCK FLUSH 100 UNIT/ML IV SOLN
500.0000 [IU] | Freq: Once | INTRAVENOUS | Status: AC | PRN
  Administered 2019-05-17: 500 [IU]
  Filled 2019-05-17: qty 5

## 2019-05-17 MED ORDER — PALONOSETRON HCL INJECTION 0.25 MG/5ML
0.2500 mg | Freq: Once | INTRAVENOUS | Status: AC
  Administered 2019-05-17: 0.25 mg via INTRAVENOUS

## 2019-05-17 MED ORDER — SODIUM CHLORIDE 0.9 % IV SOLN
20.0000 mg/m2 | Freq: Once | INTRAVENOUS | Status: AC
  Administered 2019-05-17: 14:00:00 35 mg via INTRAVENOUS
  Filled 2019-05-17: qty 35

## 2019-05-17 MED ORDER — SODIUM CHLORIDE 0.9 % IV SOLN
Freq: Once | INTRAVENOUS | Status: AC
  Filled 2019-05-17: qty 250

## 2019-05-17 MED ORDER — SODIUM CHLORIDE 0.9 % IV SOLN
1000.0000 mg/m2 | Freq: Once | INTRAVENOUS | Status: AC
  Administered 2019-05-17: 14:00:00 1748 mg via INTRAVENOUS
  Filled 2019-05-17: qty 45.97

## 2019-05-17 MED ORDER — DEXAMETHASONE SODIUM PHOSPHATE 10 MG/ML IJ SOLN
10.0000 mg | Freq: Once | INTRAMUSCULAR | Status: AC
  Administered 2019-05-17: 10 mg via INTRAVENOUS

## 2019-05-17 NOTE — Patient Instructions (Signed)
[facility] Discharge Instructions for Patients Receiving Chemotherapy  Today you received the following chemotherapy agents Cisplatin and Gemzar  To help prevent nausea and vomiting after your treatment, we encourage you to take your nausea medication as directed.    If you develop nausea and vomiting that is not controlled by your nausea medication, call the clinic.   BELOW ARE SYMPTOMS THAT SHOULD BE REPORTED IMMEDIATELY:  *FEVER GREATER THAN 100.5 F  *CHILLS WITH OR WITHOUT FEVER  NAUSEA AND VOMITING THAT IS NOT CONTROLLED WITH YOUR NAUSEA MEDICATION  *UNUSUAL SHORTNESS OF BREATH  *UNUSUAL BRUISING OR BLEEDING  TENDERNESS IN MOUTH AND THROAT WITH OR WITHOUT PRESENCE OF ULCERS  *URINARY PROBLEMS  *BOWEL PROBLEMS  UNUSUAL RASH Items with * indicate a potential emergency and should be followed up as soon as possible.  Feel free to call the clinic should you have any questions or concerns. The clinic phone number is [phone].  Please show the [facility] at check-in to the Emergency Department and triage nurse.  Cisplatin injection What is this medicine? CISPLATIN (SIS pla tin) is a chemotherapy drug. It targets fast dividing cells, like cancer cells, and causes these cells to die. This medicine is used to treat many types of cancer like bladder, ovarian, and testicular cancers. This medicine may be used for other purposes; ask your health care provider or pharmacist if you have questions. COMMON BRAND NAME(S): Platinol, Platinol -AQ What should I tell my health care provider before I take this medicine? They need to know if you have any of these conditions:  eye disease, vision problems  hearing problems  kidney disease  low blood counts, like white cells, platelets, or red blood cells  tingling of the fingers or toes, or other nerve disorder  an unusual or allergic reaction to cisplatin, carboplatin, oxaliplatin, other medicines, foods,  dyes, or preservatives  pregnant or trying to get pregnant  breast-feeding How should I use this medicine? This drug is given as an infusion into a vein. It is administered in a hospital or clinic by a specially trained health care professional. Talk to your pediatrician regarding the use of this medicine in children. Special care may be needed. Overdosage: If you think you have taken too much of this medicine contact a poison control center or emergency room at once. NOTE: This medicine is only for you. Do not share this medicine with others. What if I miss a dose? It is important not to miss a dose. Call your doctor or health care professional if you are unable to keep an appointment. What may interact with this medicine? This medicine may interact with the following medications:  foscarnet  certain antibiotics like amikacin, gentamicin, neomycin, polymyxin B, streptomycin, tobramycin, vancomycin This list may not describe all possible interactions. Give your health care provider a list of all the medicines, herbs, non-prescription drugs, or dietary supplements you use. Also tell them if you smoke, drink alcohol, or use illegal drugs. Some items may interact with your medicine. What should I watch for while using this medicine? Your condition will be monitored carefully while you are receiving this medicine. You will need important blood work done while you are taking this medicine. This drug may make you feel generally unwell. This is not uncommon, as chemotherapy can affect healthy cells as well as cancer cells. Report any side effects. Continue your course of treatment even though you feel ill unless your doctor tells you to stop. This medicine  may increase your risk of getting an infection. Call your healthcare professional for advice if you get a fever, chills, or sore throat, or other symptoms of a cold or flu. Do not treat yourself. Try to avoid being around people who are sick. Avoid  taking medicines that contain aspirin, acetaminophen, ibuprofen, naproxen, or ketoprofen unless instructed by your healthcare professional. These medicines may hide a fever. This medicine may increase your risk to bruise or bleed. Call your doctor or health care professional if you notice any unusual bleeding. Be careful brushing and flossing your teeth or using a toothpick because you may get an infection or bleed more easily. If you have any dental work done, tell your dentist you are receiving this medicine. Do not become pregnant while taking this medicine or for 14 months after stopping it. Women should inform their healthcare professional if they wish to become pregnant or think they might be pregnant. Men should not father a child while taking this medicine and for 11 months after stopping it. There is potential for serious side effects to an unborn child. Talk to your healthcare professional for more information. Do not breast-feed an infant while taking this medicine. This medicine has caused ovarian failure in some women. This medicine may make it more difficult to get pregnant. Talk to your healthcare professional if you are concerned about your fertility. This medicine has caused decreased sperm counts in some men. This may make it more difficult to father a child. Talk to your healthcare professional if you are concerned about your fertility. Drink fluids as directed while you are taking this medicine. This will help protect your kidneys. Call your doctor or health care professional if you get diarrhea. Do not treat yourself. What side effects may I notice from receiving this medicine? Side effects that you should report to your doctor or health care professional as soon as possible:  allergic reactions like skin rash, itching or hives, swelling of the face, lips, or tongue  blurred vision  changes in vision  decreased hearing or ringing of the ears  nausea, vomiting  pain,  redness, or irritation at site where injected  pain, tingling, numbness in the hands or feet  signs and symptoms of bleeding such as bloody or black, tarry stools; red or dark brown urine; spitting up blood or brown material that looks like coffee grounds; red spots on the skin; unusual bruising or bleeding from the eyes, gums, or nose  signs and symptoms of infection like fever; chills; cough; sore throat; pain or trouble passing urine  signs and symptoms of kidney injury like trouble passing urine or change in the amount of urine  signs and symptoms of low red blood cells or anemia such as unusually weak or tired; feeling faint or lightheaded; falls; breathing problems Side effects that usually do not require medical attention (report to your doctor or health care professional if they continue or are bothersome):  loss of appetite  mouth sores  muscle cramps This list may not describe all possible side effects. Call your doctor for medical advice about side effects. You may report side effects to FDA at 1-800-FDA-1088. Where should I keep my medicine? This drug is given in a hospital or clinic and will not be stored at home. NOTE: This sheet is a summary. It may not cover all possible information. If you have questions about this medicine, talk to your doctor, pharmacist, or health care provider.  2020 Elsevier/Gold Standard (2018-02-25 15:59:17)  Gemcitabine injection What is this medicine? GEMCITABINE (jem SYE ta been) is a chemotherapy drug. This medicine is used to treat many types of cancer like breast cancer, lung cancer, pancreatic cancer, and ovarian cancer. This medicine may be used for other purposes; ask your health care provider or pharmacist if you have questions. COMMON BRAND NAME(S): Gemzar, Infugem What should I tell my health care provider before I take this medicine? They need to know if you have any of these conditions:  blood disorders  infection  kidney  disease  liver disease  lung or breathing disease, like asthma  recent or ongoing radiation therapy  an unusual or allergic reaction to gemcitabine, other chemotherapy, other medicines, foods, dyes, or preservatives  pregnant or trying to get pregnant  breast-feeding How should I use this medicine? This drug is given as an infusion into a vein. It is administered in a hospital or clinic by a specially trained health care professional. Talk to your pediatrician regarding the use of this medicine in children. Special care may be needed. Overdosage: If you think you have taken too much of this medicine contact a poison control center or emergency room at once. NOTE: This medicine is only for you. Do not share this medicine with others. What if I miss a dose? It is important not to miss your dose. Call your doctor or health care professional if you are unable to keep an appointment. What may interact with this medicine?  medicines to increase blood counts like filgrastim, pegfilgrastim, sargramostim  some other chemotherapy drugs like cisplatin  vaccines Talk to your doctor or health care professional before taking any of these medicines:  acetaminophen  aspirin  ibuprofen  ketoprofen  naproxen This list may not describe all possible interactions. Give your health care provider a list of all the medicines, herbs, non-prescription drugs, or dietary supplements you use. Also tell them if you smoke, drink alcohol, or use illegal drugs. Some items may interact with your medicine. What should I watch for while using this medicine? Visit your doctor for checks on your progress. This drug may make you feel generally unwell. This is not uncommon, as chemotherapy can affect healthy cells as well as cancer cells. Report any side effects. Continue your course of treatment even though you feel ill unless your doctor tells you to stop. In some cases, you may be given additional medicines to  help with side effects. Follow all directions for their use. Call your doctor or health care professional for advice if you get a fever, chills or sore throat, or other symptoms of a cold or flu. Do not treat yourself. This drug decreases your body's ability to fight infections. Try to avoid being around people who are sick. This medicine may increase your risk to bruise or bleed. Call your doctor or health care professional if you notice any unusual bleeding. Be careful brushing and flossing your teeth or using a toothpick because you may get an infection or bleed more easily. If you have any dental work done, tell your dentist you are receiving this medicine. Avoid taking products that contain aspirin, acetaminophen, ibuprofen, naproxen, or ketoprofen unless instructed by your doctor. These medicines may hide a fever. Do not become pregnant while taking this medicine or for 6 months after stopping it. Women should inform their doctor if they wish to become pregnant or think they might be pregnant. Men should not father a child while taking this medicine and for 3 months after stopping  it. There is a potential for serious side effects to an unborn child. Talk to your health care professional or pharmacist for more information. Do not breast-feed an infant while taking this medicine or for at least 1 week after stopping it. Men should inform their doctors if they wish to father a child. This medicine may lower sperm counts. Talk with your doctor or health care professional if you are concerned about your fertility. What side effects may I notice from receiving this medicine? Side effects that you should report to your doctor or health care professional as soon as possible:  allergic reactions like skin rash, itching or hives, swelling of the face, lips, or tongue  breathing problems  pain, redness, or irritation at site where injected  signs and symptoms of a dangerous change in heartbeat or heart  rhythm like chest pain; dizziness; fast or irregular heartbeat; palpitations; feeling faint or lightheaded, falls; breathing problems  signs of decreased platelets or bleeding - bruising, pinpoint red spots on the skin, black, tarry stools, blood in the urine  signs of decreased red blood cells - unusually weak or tired, feeling faint or lightheaded, falls  signs of infection - fever or chills, cough, sore throat, pain or difficulty passing urine  signs and symptoms of kidney injury like trouble passing urine or change in the amount of urine  signs and symptoms of liver injury like dark yellow or brown urine; general ill feeling or flu-like symptoms; light-colored stools; loss of appetite; nausea; right upper belly pain; unusually weak or tired; yellowing of the eyes or skin  swelling of ankles, feet, hands Side effects that usually do not require medical attention (report to your doctor or health care professional if they continue or are bothersome):  constipation  diarrhea  hair loss  loss of appetite  nausea  rash  vomiting This list may not describe all possible side effects. Call your doctor for medical advice about side effects. You may report side effects to FDA at 1-800-FDA-1088. Where should I keep my medicine? This drug is given in a hospital or clinic and will not be stored at home. NOTE: This sheet is a summary. It may not cover all possible information. If you have questions about this medicine, talk to your doctor, pharmacist, or health care provider.  2020 Elsevier/Gold Standard (2017-05-26 18:06:11)

## 2019-05-18 ENCOUNTER — Telehealth: Payer: Self-pay | Admitting: Nurse Practitioner

## 2019-05-18 NOTE — Telephone Encounter (Signed)
Scheduled per 03/03 los, called patient regarding upcoming appointments. Patient is notified.

## 2019-05-24 ENCOUNTER — Inpatient Hospital Stay: Payer: Medicare Other

## 2019-05-24 ENCOUNTER — Other Ambulatory Visit: Payer: Self-pay | Admitting: Hematology

## 2019-05-24 ENCOUNTER — Encounter: Payer: Self-pay | Admitting: Nurse Practitioner

## 2019-05-24 ENCOUNTER — Other Ambulatory Visit: Payer: Self-pay

## 2019-05-24 VITALS — BP 159/78 | HR 62 | Temp 98.1°F | Resp 17

## 2019-05-24 DIAGNOSIS — C786 Secondary malignant neoplasm of retroperitoneum and peritoneum: Secondary | ICD-10-CM | POA: Diagnosis not present

## 2019-05-24 DIAGNOSIS — C221 Intrahepatic bile duct carcinoma: Secondary | ICD-10-CM | POA: Diagnosis not present

## 2019-05-24 DIAGNOSIS — D649 Anemia, unspecified: Secondary | ICD-10-CM | POA: Diagnosis not present

## 2019-05-24 DIAGNOSIS — Z5111 Encounter for antineoplastic chemotherapy: Secondary | ICD-10-CM | POA: Diagnosis not present

## 2019-05-24 DIAGNOSIS — D630 Anemia in neoplastic disease: Secondary | ICD-10-CM

## 2019-05-24 DIAGNOSIS — Z95828 Presence of other vascular implants and grafts: Secondary | ICD-10-CM

## 2019-05-24 LAB — CBC WITH DIFFERENTIAL (CANCER CENTER ONLY)
Abs Immature Granulocytes: 0.02 10*3/uL (ref 0.00–0.07)
Basophils Absolute: 0 10*3/uL (ref 0.0–0.1)
Basophils Relative: 2 %
Eosinophils Absolute: 0 10*3/uL (ref 0.0–0.5)
Eosinophils Relative: 1 %
HCT: 24.3 % — ABNORMAL LOW (ref 36.0–46.0)
Hemoglobin: 7.7 g/dL — ABNORMAL LOW (ref 12.0–15.0)
Immature Granulocytes: 1 %
Lymphocytes Relative: 55 %
Lymphs Abs: 1.1 10*3/uL (ref 0.7–4.0)
MCH: 29.8 pg (ref 26.0–34.0)
MCHC: 31.7 g/dL (ref 30.0–36.0)
MCV: 94.2 fL (ref 80.0–100.0)
Monocytes Absolute: 0.1 10*3/uL (ref 0.1–1.0)
Monocytes Relative: 7 %
Neutro Abs: 0.7 10*3/uL — ABNORMAL LOW (ref 1.7–7.7)
Neutrophils Relative %: 34 %
Platelet Count: 180 10*3/uL (ref 150–400)
RBC: 2.58 MIL/uL — ABNORMAL LOW (ref 3.87–5.11)
RDW: 20.8 % — ABNORMAL HIGH (ref 11.5–15.5)
WBC Count: 2 10*3/uL — ABNORMAL LOW (ref 4.0–10.5)
nRBC: 0 % (ref 0.0–0.2)

## 2019-05-24 LAB — CMP (CANCER CENTER ONLY)
ALT: 16 U/L (ref 0–44)
AST: 34 U/L (ref 15–41)
Albumin: 3.1 g/dL — ABNORMAL LOW (ref 3.5–5.0)
Alkaline Phosphatase: 57 U/L (ref 38–126)
Anion gap: 7 (ref 5–15)
BUN: 15 mg/dL (ref 8–23)
CO2: 28 mmol/L (ref 22–32)
Calcium: 8.7 mg/dL — ABNORMAL LOW (ref 8.9–10.3)
Chloride: 106 mmol/L (ref 98–111)
Creatinine: 1.12 mg/dL — ABNORMAL HIGH (ref 0.44–1.00)
GFR, Est AFR Am: 54 mL/min — ABNORMAL LOW (ref >60)
GFR, Estimated: 46 mL/min — ABNORMAL LOW (ref >60)
Glucose, Bld: 152 mg/dL — ABNORMAL HIGH (ref 70–99)
Potassium: 3.9 mmol/L (ref 3.5–5.1)
Sodium: 141 mmol/L (ref 135–145)
Total Bilirubin: 0.3 mg/dL (ref 0.3–1.2)
Total Protein: 6.4 g/dL — ABNORMAL LOW (ref 6.5–8.1)

## 2019-05-24 LAB — PREPARE RBC (CROSSMATCH)

## 2019-05-24 MED ORDER — SODIUM CHLORIDE 0.9% FLUSH
10.0000 mL | Freq: Once | INTRAVENOUS | Status: AC
  Administered 2019-05-24: 10 mL
  Filled 2019-05-24: qty 10

## 2019-05-24 MED ORDER — DIPHENHYDRAMINE HCL 25 MG PO CAPS
ORAL_CAPSULE | ORAL | Status: AC
  Filled 2019-05-24: qty 2

## 2019-05-24 MED ORDER — HEPARIN SOD (PORK) LOCK FLUSH 100 UNIT/ML IV SOLN
500.0000 [IU] | Freq: Every day | INTRAVENOUS | Status: AC | PRN
  Administered 2019-05-24: 500 [IU]
  Filled 2019-05-24: qty 5

## 2019-05-24 MED ORDER — SODIUM CHLORIDE 0.9% FLUSH
10.0000 mL | INTRAVENOUS | Status: AC | PRN
  Administered 2019-05-24: 10 mL
  Filled 2019-05-24: qty 10

## 2019-05-24 MED ORDER — DIPHENHYDRAMINE HCL 25 MG PO CAPS
50.0000 mg | ORAL_CAPSULE | Freq: Once | ORAL | Status: AC
  Administered 2019-05-24: 50 mg via ORAL

## 2019-05-24 MED ORDER — SODIUM CHLORIDE 0.9% IV SOLUTION
250.0000 mL | Freq: Once | INTRAVENOUS | Status: DC
  Filled 2019-05-24: qty 250

## 2019-05-24 MED ORDER — ACETAMINOPHEN 325 MG PO TABS
ORAL_TABLET | ORAL | Status: AC
  Filled 2019-05-24: qty 2

## 2019-05-24 MED ORDER — ACETAMINOPHEN 325 MG PO TABS
650.0000 mg | ORAL_TABLET | Freq: Once | ORAL | Status: AC
  Administered 2019-05-24: 650 mg via ORAL

## 2019-05-24 NOTE — Progress Notes (Signed)
Dr. Burr Medico aware of ending BP today after 2 units, pt reports no symptoms, due to take her next HTN dose tomorrow. NO new orders received at this time.

## 2019-05-24 NOTE — Patient Instructions (Signed)
https://www.[email or web address]/donate-blood/blood-donation-process/what-happens-to-donated-blood/blood-transfusions/types-of-[email or web address].html"> https://www.[facility].org/donate-blood/blood-donation-process/what-happens-to-donated-blood/blood-transfusions/risks-complications.html">  Blood Transfusion, Adult, Care After This sheet gives you information about how to care for yourself after your procedure. Your health care provider may also give you more specific instructions. If you have problems or questions, contact your health care provider. What can I expect after the procedure? After the procedure, it is common to have:  Bruising and soreness where the IV was inserted.  A fever or chills on the day of the procedure. This may be your body's response to the new blood cells received.  A headache. Follow these instructions at home: IV insertion site care      Follow instructions from your health care provider about how to take care of your IV insertion site. Make sure you: ? Wash your hands with soap and water before and after you change your bandage (dressing). If soap and water are not available, use hand sanitizer. ? Change your dressing as told by your health care provider.  Check your IV insertion site every day for signs of infection. Check for: ? Redness, swelling, or pain. ? Bleeding from the site. ? Warmth. ? Pus or a bad smell. General instructions  Take over-the-counter and prescription medicines only as told by your health care provider.  Rest as told by your health care provider.  Return to your normal activities as told by your health care provider.  Keep all follow-up visits as told by your health care provider. This is important. Contact a health care provider if:  You have itching or red, swollen areas of skin (hives).  You feel anxious.  You feel weak after doing your normal activities.  You have redness, swelling, warmth, or pain around the IV  insertion site.  You have blood coming from the IV insertion site that does not stop with pressure.  You have pus or a bad smell coming from your IV insertion site. Get help right away if:  You have symptoms of a serious allergic or immune system reaction, including: ? Trouble breathing or shortness of breath. ? Swelling of the face or feeling flushed. ? Fever or chills. ? Pain in the head, back, or chest. ? Dark urine or blood in the urine. ? Widespread rash. ? Fast heartbeat. ? Feeling dizzy or light-headed. If you receive your blood transfusion in an outpatient setting, you will be told whom to contact to report any reactions. These symptoms may represent a serious problem that is an emergency. Do not wait to see if the symptoms will go away. Get medical help right away. Call your local emergency services (911 in the [place] Do not drive yourself to the hospital. Summary  Bruising and tenderness around the IV insertion site are common.  Check your IV insertion site every day for signs of infection.  Rest as told by your health care provider. Return to your normal activities as told by your health care provider.  Get help right away for symptoms of a serious allergic or immune system reaction to blood transfusion. This information is not intended to replace advice given to you by your health care provider. Make sure you discuss any questions you have with your health care provider. Document Revised: 08/25/2018 Document Reviewed: 08/25/2018 Elsevier Patient Education  2020 [employer]  

## 2019-05-25 LAB — BPAM RBC
Blood Product Expiration Date: 202104032359
Blood Product Expiration Date: 202104032359
ISSUE DATE / TIME: 202103101143
ISSUE DATE / TIME: 202103101143
Unit Type and Rh: 6200
Unit Type and Rh: 6200

## 2019-05-25 LAB — TYPE AND SCREEN
ABO/RH(D): A POS
Antibody Screen: NEGATIVE
Unit division: 0
Unit division: 0

## 2019-05-25 NOTE — Progress Notes (Signed)
Pharmacist Chemotherapy Monitoring - Follow Up Assessment    I verify that I have reviewed each item in the below checklist:  Regimen:  . Patient due for treatment based on regimen frequency. . Plan date matches the patient's scheduled date. . Premedications are appropriate for the patient's regimen. . Assessed if patient has a supportive care therapy plans, and if patient is due for treatment.  Organ Function and Labs: . Appropriate labs and drug-specific monitoring are ordered prior to the patient's treatment.  Dose Assessment: . If applicable to patient's regimen, lifetime cumulative doses have been properly documented and assessed.  Toxicity Monitoring/Prevention: . Patient has appropriate take home medications prescribed.  . Medication allergies and previous infusion related reactions, if applicable, have been reviewed and addressed.  Order Review: . Treatment plan orders signed and/or patient is scheduled to see a provider prior to their treatment.  Britt Boozer 05/25/2019 2:35 PM

## 2019-05-30 NOTE — Progress Notes (Signed)
Donna May   [phone] [phone]   Clinic Follow up Note   Patient Care Team: Donna Ruddy, MD as PCP - General (Family Medicine) [name], [name] Dad, MD as Consulting Physician (Oncology) [name], [name], MD as Consulting Physician (Cardiology) [name], MD as Consulting Physician (Obstetrics and Gynecology) [name], [name], NP as Nurse Practitioner (Hematology and Oncology) [facility] [name], MD as Consulting Physician (Hematology) [name], [name], MD as Consulting Physician (Gastroenterology) [name], RN (Inactive) as Oncology Nurse Navigator [name], MD as Consulting Physician (General Surgery) 05/31/2019  CHIEF COMPLAINT: F/u cholangiocarcinoma   SUMMARY OF ONCOLOGIC HISTORY: Oncology History  Malignant neoplasm of female breast ([place])  11/06/2018 Genetic Testing   Negative genetic testing on the [facility] Common Hereditary Cancers panel. A variant of uncertain significance was identified in one of her APC genes, called c.1243G>A (p.Ala415Thr).  The Common Hereditary Cancers Panel offered by [facility] includes sequencing and/or deletion duplication testing of the following 48 genes: APC, ATM, AXIN2, BARD1, BMPR1A, BRCA1, BRCA2, BRIP1, CDH1, CDK4, CDKN2A (p14ARF), CDKN2A (p16INK4a), CHEK2, CTNNA1, DICER1, EPCAM (Deletion/duplication testing only), GREM1 (promoter region deletion/duplication testing only), KIT, MEN1, MLH1, MSH2, MSH3, MSH6, MUTYH, NBN, NF1, NHTL1, PALB2, PDGFRA, PMS2, POLD1, POLE, PTEN, RAD50, RAD51C, RAD51D, RNF43, SDHB, SDHC, SDHD, SMAD4, SMARCA4. STK11, TP53, TSC1, TSC2, and VHL.  The following genes were evaluated for sequence changes only: SDHA and HOXB13 c.251G>A variant only.    Intrahepatic cholangiocarcinoma ([place])  09/07/2018 Imaging   CT Chest IMPRESSION: 1. New, enhancing mass involving segment 4 of the liver and fundus of gallbladder is concerning for malignancy.  This may represent either metastatic disease from breast cancer or neoplasm primary to the liver or hepatic biliary tree. Further evaluation with contrast enhanced CT of the abdomen and pelvis is recommended. 2. No findings to suggest metastatic disease within the chest. 3.  Aortic Atherosclerosis (ICD10-I70.0). 4. Coronary artery calcifications.   09/13/2018 Pathology Results   Diagnosis Liver, needle/core biopsy - ADENOCARCINOMA. Microscopic Comment Immunohistochemistry for CK7 is positive. CK20, TTF1, CDX-2, GATA-3, PAX 8, Qualitative ER, p63 and CK5/6 are negative. The provided clinical history of remote mammary carcinoma is noted. Based on the morphology and immunophenotype of the adenocarcinoma observed in this specimen, primary cholangiocarcinoma is favored. Clinical and radiologic correlation are  encouraged. Results reported to [employer] on 09/15/2018. Intradepartmental consultation (Dr. Vic Ripper).   09/13/2018 Initial Diagnosis   Cholangiocarcinoma ([place])   09/23/2018 Procedure   Colonoscopy by Dr. Havery Moros 09/23/18  IMPRESSION - Two 3 to 4 mm polyps in the ascending colon, removed with a cold snare. Resected and retrieved. - Five 3 to 5 mm polyps in the transverse colon, removed with a cold snare. Resected and retrieved. - One 5 mm polyp at the splenic flexure, removed with a cold snare. Resected and retrieved. - Three 3 to 5 mm polyps in the sigmoid colon, removed with a cold snare. Resected and retrieved. - The examination was otherwise normal. Upper Endopscy by Dr. Havery Moros 09/23/18  IMPRESSION - Esophagogastric landmarks identified. - 2 cm hiatal hernia. - Normal esophagus otherwise. - A single gastric polyp. Resected and retrieved. - Mild gastritis. Biopsied. - Normal duodenal bulb and second portion of the duodenum.   09/23/2018 Pathology Results   Diagnosis 09/23/18 1. Surgical [P], duodenum - BENIGN SMALL BOWEL MUCOSA. - NO ACTIVE INFLAMMATION OR VILLOUS  ATROPHY IDENTIFIED. 2. Surgical [P], stomach, polyp - HYPERPLASTIC POLYP(S). - THERE IS NO EVIDENCE OF MALIGNANCY. 3. Surgical [P], gastric  antrum and gastric body - CHRONIC INACTIVE GASTRITIS. - THERE IS NO EVIDENCE OF HELICOBACTER-PYLORI, DYSPLASIA, OR MALIGNANCY. - SEE COMMENT. 4. Surgical [P], colon, sigmoid, splenic flexure, transverse and ascending, polyp (9) - TUBULAR ADENOMA(S). - SESSILE SERRATED POLYP WITHOUT CYTOLOGIC DYSPLASIA. - HIGH GRADE DYSPLASIA IS NOT IDENTIFIED. 5. Surgical [P], colon, sigmoid, polyp (2) - HYPERPLASTIC POLYP(S). - THERE IS NO EVIDENCE OF MALIGNANCY.   09/23/2018 Cancer Staging   Staging form: Intrahepatic Bile Duct, AJCC 8th Edition - Clinical stage from 09/23/2018: Stage IB (cT1b, cN0, cM0) - Signed by [name], MD on 10/06/2018   09/29/2018 PET scan   PET 09/29/18 IMPRESSION: 1. Hypermetabolic mass in the RIGHT hepatic lobe consistent with biopsy proven adenocarcinoma. No additional liver metastasis. 2. No evidence of local breast cancer recurrence in the RIGHT breast or RIGHT axilla. 3. Mild bilateral hypermetabolic adrenal glands is favored benign hyperplasia. 4. No evidence of additional metastatic disease on skull base to thigh FDG PET scan.   11/06/2018 Genetic Testing   Negative genetic testing on the [facility] Common Hereditary Cancers panel. A variant of uncertain significance was identified in one of her APC genes, called c.1243G>A (p.Ala415Thr).  The Common Hereditary Cancers Panel offered by [facility] includes sequencing and/or deletion duplication testing of the following 48 genes: APC, ATM, AXIN2, BARD1, BMPR1A, BRCA1, BRCA2, BRIP1, CDH1, CDK4, CDKN2A (p14ARF), CDKN2A (p16INK4a), CHEK2, CTNNA1, DICER1, EPCAM (Deletion/duplication testing only), GREM1 (promoter region deletion/duplication testing only), KIT, MEN1, MLH1, MSH2, MSH3, MSH6, MUTYH, NBN, NF1, NHTL1, PALB2, PDGFRA, PMS2, POLD1, POLE, PTEN, RAD50, RAD51C, RAD51D, RNF43, SDHB, SDHC,  SDHD, SMAD4, SMARCA4. STK11, TP53, TSC1, TSC2, and VHL.  The following genes were evaluated for sequence changes only: SDHA and HOXB13 c.251G>A variant only.    11/17/2018 Pathology Results   Diagnosis 11/17/18 1. Soft tissue, biopsy, Diaphragmatic nodules - METASTATIC ADENOCARCINOMA, CONSISTENT WITH PATIENT'S CLINICAL HISTORY OF CHOLANGIOCARCINOMA. SEE NOTE 2. Liver, biopsy, Left - LIVER PARENCHYMA WITH A BENIGN FIBROTIC NODULE - NO EVIDENCE OF MALIGNANCY 3. Stomach, biopsy - BENIGN PAPILLARY MESOTHELIAL HYPERPLASIA - NO EVIDENCE OF MALIGNANCY   11/29/2018 Imaging   CT CAP WO Contrast  IMPRESSION: 1. Dominant liver mass appears grossly stable from 09/29/2018. Additional liver lesions are too small to characterize but were not shown to be hypermetabolic on PET. 2. Mild nodularity of both adrenal glands with associated hypermetabolism on 09/29/2018. Continued attention on follow-up exams is warranted. 3. Small right lower lobe nodules, stable from 09/07/2018. Again, attention on follow-up is recommended. 4. Trace bilateral pleural fluid. 5. Aortic atherosclerosis (ICD10-170.0). Coronary artery calcification. 6. Enlarged pulmonic trunk, indicative of pulmonary arterial hypertension.     11/30/2018 -  Chemotherapy   First line chemo Oxaliplatin and gemcitabine q2weeks starting 11/30/18. Stopped Oxaliplatin on 05/03/19 due to worsening neuropathy. May add Cisplatin with C13 on 3/3.    01/20/2019 Imaging   CT CAP IMPRESSION: restaging  1. Mild interval increase in size of dominant liver mass involving segment 4 and segment 5. 2. No significant or progressive adrenal nodularity identified to suggest metastatic disease. 3. Unchanged appearance of small right lower lobe and lingular lung nodules, nonspecific.   03/21/2019 PET scan   IMPRESSION: 1. Large right hepatic mass with SUV uptake near background hepatic activity, dramatic response to therapy, also with decrease in size when  compared to the prior study. 2. Signs of prior right mastectomy and axillary dissection as before. 3. Left adrenal activity in no longer above the level of activity seen in the contralateral, right adrenal gland or  in the liver. Attention on follow-up. 4. No new signs of disease.     CURRENT THERAPY:  First line chemoOxaliplatinand gemcitabine q2weeksstarting 11/30/18.Stopped Oxaliplatin on 05/03/19 due to worsening neuropathy. Added Cisplatin with C13 on 3/3.  INTERVAL HISTORY: Ms. Leavens returns for f/u as scheduled. She started cisplatin/gem on 05/17/19. She returned for RBC transfusion on 05/24/19. Her energy improved afterwards. She is able to do housework and remain independent. Appetite is normal. Constipation at baseline, no n/v/d. Denies new or worsening abdominal pain. she has mild DOE when rushing somewhere. Neuropathy is worse after last cycle. She is dropping things. Balance is off and her gait is abnormal. No fall. She has constant tingling in feet and "numbness up to my vaginal area." denies incontinence. Denies back pain or weakness. Takes 1-2 gabapentin at night most of the time. No fever, chills, cough, chest pain, leg swelling, rash, bleeding, or mucositis.    MEDICAL HISTORY:  Past Medical History:  Diagnosis Date  . Anemia   . Anxiety   . Arthritis   . Breast cancer ([place]) 1992  . Cataract    Bilateral eyes - surgery to remove  . Chronic kidney disease    CKD stage 3  . Complication of anesthesia    "something they use make me itch for a couple of days."  . Depression   . Diabetes mellitus    type 2  . Duodenitis 01/18/2002  . Fainting spell   . Family history of breast cancer   . Family history of prostate cancer   . GERD (gastroesophageal reflux disease)   . Heart murmur    never has caused any problems  . Hiatal hernia 08/08/2008, 01/18/2002  . History of pneumonia    x 2  . Hyperlipidemia   . Hypertension   . Liver cancer ([place]) 08/2018  . Lymphedema  2017   Right arm  . Pneumonia    x 2  . UTI (lower urinary tract infection)     SURGICAL HISTORY: Past Surgical History:  Procedure Laterality Date  . AXILLARY SURGERY     cyst removal, right  . COLONOSCOPY  09/23/2018   Dr. Havery Moros - polyps  . EYE SURGERY Bilateral    cataracts to remove  . LAPAROSCOPY N/A 11/17/2018   Procedure: LAPAROSCOPY DIAGNOSTIC, INTRAOPERATIVE ULTRASOUND, PERITONEAL BIOPSIES;  Surgeon: [name], MD;  Location: [facility];  Service: General;  Laterality: N/A;  GENERAL AND EPIDURAL  . LIVER BIOPSY  08/2018   Dr. Lindwood Coke  . MASTECTOMY  1992   right, with flap  . NM MYOCAR PERF WALL MOTION  06/11/2009   Protocol:[place], post stress EF58%, EKG negative for ischemia, low risk  . PORTACATH PLACEMENT N/A 12/02/2018   Procedure: INSERTION PORT-A-CATH;  Surgeon: [name], MD;  Location: [facility] [place];  Service: General;  Laterality: N/A;  . RECONSTRUCTION BREAST W/ TRAM FLAP Right   . TONSILLECTOMY    . TRANSTHORACIC ECHOCARDIOGRAM  12/24/2009   LVEF =>55%, normal study  . UPPER GI ENDOSCOPY      I have reviewed the social history and family history with the patient and they are unchanged from previous note.  ALLERGIES:  has No Known Allergies.  MEDICATIONS:  Current Outpatient Medications  Medication Sig Dispense Refill  . amLODipine (NORVASC) 10 MG tablet TAKE 1 TABLET BY MOUTH EVERY DAY 90 tablet 3  . aspirin 81 MG tablet Take 81 mg by mouth at bedtime.     . Calcium Carbonate-Vitamin D 600-200 MG-UNIT TABS Take 1  tablet by mouth daily.    Marland Kitchen CALCIUM-MAGNESIUM-VITAMIN D PO Take 1 tablet by mouth once a week.     Marland Kitchen Echinacea-Goldenseal (ECHINACEA COMB/GOLDEN SEAL) CAPS Take by mouth.    . gabapentin (NEURONTIN) 100 MG capsule Take 1 capsule (100 mg total) by mouth at bedtime. 90 capsule 1  . glucose blood test strip 1 each by Other route 2 (two) times daily. Use Onetouch verio test strips as instructed to check blood sugar twice daily. 100 each 2  .  hydrochlorothiazide (HYDRODIURIL) 25 MG tablet Take 1 tablet (25 mg total) by mouth daily. 90 tablet 3  . HYDROcodone-acetaminophen (NORCO/VICODIN) 5-325 MG tablet Take 1 tablet by mouth every 6 (six) hours as needed for moderate pain. 10 tablet 0  . ketoconazole (NIZORAL) 2 % cream Apply 1 fingertip amount to each foot daily. 30 g 0  . lansoprazole (PREVACID) 15 MG capsule TAKE 1 CAPSULE DAILY BEFOREBREAKFAST (NEED TO MAKE AN OFFICE VISIT FOR FURTHER   REFILLS) (Patient taking differently: Take 15 mg by mouth daily. ) 90 capsule 1  . lidocaine-prilocaine (EMLA) cream Apply to affected area once 90 g 1  . loratadine (CLARITIN) 10 MG tablet Take 10 mg by mouth daily as needed for allergies.    . metFORMIN (GLUCOPHAGE-XR) 500 MG 24 hr tablet Take 1 tablet (500 mg total) by mouth 2 (two) times daily before a meal. 180 tablet 1  . metoprolol succinate (TOPROL-XL) 100 MG 24 hr tablet Take 1 tablet (100 mg total) by mouth daily. Take with or immediately following a meal. 90 tablet 1  . Omega 3 1000 MG CAPS Take 2,000 mg by mouth daily.     . Prenatal Vit-Fe Fumarate-FA (MULTIVITAMIN-PRENATAL) 27-0.8 MG TABS tablet Take 1 tablet by mouth daily at 12 noon.    . raloxifene (EVISTA) 60 MG tablet TAKE 1 TABLET DAILY 90 tablet 0  . rosuvastatin (CRESTOR) 20 MG tablet Take 1 tablet (20 mg total) by mouth daily. 90 tablet 1  . sitaGLIPtin (JANUVIA) 100 MG tablet Take 1 tablet (100 mg total) by mouth daily. 90 tablet 4  . valsartan (DIOVAN) 320 MG tablet Take 1 tablet (320 mg total) by mouth daily. 90 tablet 3  . Cholecalciferol (VITAMIN D3) 2000 UNITS TABS Take 1 tablet by mouth once a week.     . ondansetron (ZOFRAN) 8 MG tablet Take 1 tablet (8 mg total) by mouth every 8 (eight) hours as needed for nausea or vomiting. (Patient not taking: Reported on 05/31/2019) 30 tablet 0  . prochlorperazine (COMPAZINE) 10 MG tablet Take 1 tablet (10 mg total) by mouth every 6 (six) hours as needed for nausea or vomiting.  (Patient not taking: Reported on 05/31/2019) 30 tablet 0  . triamcinolone cream (KENALOG) 0.1 % Apply 1 application topically 2 (two) times daily. (Patient not taking: Reported on 05/31/2019) 30 g 0   Current Facility-Administered Medications  Medication Dose Route Frequency Provider Last Rate Last Admin  . triamcinolone acetonide (KENALOG) 10 MG/ML injection 10 mg  10 mg Other Once Harriet Masson, DPM       Facility-Administered Medications Ordered in Other Visits  Medication Dose Route Frequency Provider Last Rate Last Admin  . CISplatin (PLATINOL) 26 mg in sodium chloride 0.9 % 250 mL chemo infusion  15 mg/m2 (Treatment Plan Recorded) Intravenous Once [name], MD 276 mL/hr at 05/31/19 1356 26 mg at 05/31/19 1356  . heparin lock flush 100 unit/mL  500 Units Intracatheter Once PRN [name], MD      .  sodium chloride flush (NS) 0.9 % injection 10 mL  10 mL Intracatheter PRN [name], MD        PHYSICAL EXAMINATION: ECOG PERFORMANCE STATUS: 1 - Symptomatic but completely ambulatory  Vitals:   05/31/19 0819 05/31/19 0835  BP: (!) 180/90 (!) 165/70  Pulse: 85   Resp: 17   Temp: 98.5 F (36.9 C)   SpO2: 100%    Filed Weights   05/31/19 0819  Weight: 142 lb 1.6 oz (64.5 kg)    GENERAL:alert, no distress and comfortable SKIN: no rash  EYES: sclera clear LUNGS: clear to auscultation and percussion with normal breathing effort HEART: regular rate & rhythm, no lower extremity edema ABDOMEN: abdomen soft, non-tender and normal bowel sounds Musculoskeletal: no focal tenderness  NEURO: alert & oriented x 3 with fluent speech, intact touch sensation throughout. Decreased peripheral vibratory sense in feet > hands per tuning fork exam. Gait initially uncoordinated, becomes more steady with further ambulation PAC without erythema   LABORATORY DATA:  I have reviewed the data as listed CBC Latest Ref Rng & Units 05/31/2019 05/24/2019 05/17/2019  WBC 4.0 - 10.5 K/uL 5.9 2.0(L) 7.2    Hemoglobin 12.0 - 15.0 g/dL 9.8(L) 7.7(L) 8.1(L)  Hematocrit 36.0 - 46.0 % 30.6(L) 24.3(L) 25.2(L)  Platelets 150 - 400 K/uL 124(L) 180 166     CMP Latest Ref Rng & Units 05/31/2019 05/24/2019 05/17/2019  Glucose 70 - 99 mg/dL 200(H) 152(H) 115(H)  BUN 8 - 23 mg/dL '16 15 14  '$ Creatinine 0.44 - 1.00 mg/dL 1.07(H) 1.12(H) 1.20(H)  Sodium 135 - 145 mmol/L 140 141 140  Potassium 3.5 - 5.1 mmol/L 3.9 3.9 3.7  Chloride 98 - 111 mmol/L 108 106 105  CO2 22 - 32 mmol/L '25 28 25  '$ Calcium 8.9 - 10.3 mg/dL 8.5(L) 8.7(L) 8.6(L)  Total Protein 6.5 - 8.1 g/dL 6.4(L) 6.4(L) 6.8  Total Bilirubin 0.3 - 1.2 mg/dL 0.4 0.3 0.4  Alkaline Phos 38 - 126 U/L 61 57 54  AST 15 - 41 U/L 31 34 37  ALT 0 - 44 U/L '10 16 10      '$ RADIOGRAPHIC STUDIES: I have personally reviewed the radiological images as listed and agreed with the findings in the report. No results found.   ASSESSMENT & PLAN: KYLEA BERRONG [id number]   1.Intrahepatic cholangiocarcinoma, cT1N0M1,with peritoneal metastasis, MSS, IDH1 mutation (+) -Diagnosed in 09/2018 biopsy of her liver mass shows adenocarcinoma,most consistent withcholangiocarcinoma -09/29/18 PET scanshowedno evidence of distant metastasis. -Unfortunately she was found to have peritoneal metastasis to right diaphragm and surgery was aborted. -HerCT AP from 9/15/20shows dominate liver mass stable to mild increase, mild nodularity of both adrenal glands which warrants being monitored.No visible peritoneal metastasis on CT scan. -Given her metastatic cancer and ineligibility forsurgery,she began first-line systemic chemotherapy for disease control. -FO resultsshowed MSI stable disease, IDH mutation positive.Consider IDH 1 inhibitor in the future.Not a candidate for immunotherapy -She beganfirst lineoxaliplatin (due to CKD) and gemcitabine every 2 weeks on 11/30/2018 -she responded very well to treatment and tolerated without significant toxicities  except progressive neuropathy. Oxali was dose reduced and eventually stopped after cycle 11. She received single agent gemcitabine with cycle 12.  -Began Cisplatin with gemcitabine with cycle 13 on 05/17/19  2. H/o right breast cancer, Geneticsnegative -s/p mastectomy in 1992, per patient did not require adjuvant therapy  -previouslyfollowed by Dr. Jana Hakim -VUS of gene APC;genetics otherwise normal  3.Comorbidities:DM, HTN, hiatal hernia, GERD, renal artery stenosis -Follow-up with PCP, nephrology, and Dr.  Kumar  -Crfluctuates, overall much improved fromJune- Sept this year (1.78 at highest). -I encouraged her to hydrate and avoid nephrotoxic agents. -will monitor closely on cisplatin   4. Family support  -she has a son and daughterwho are supportive. -patient takes pride in remaining independent, but has become more reluctant to drive due to CIPN in her feet  5.Goal of care discussion  -Wefrequently discussthe goal of her treatment is palliative and that hercancer is not curable with chemo alone at this stage. The goal is to control her disease, prolong her life, and give her good quality of life. She understands -she is full code now  6. CIPN -secondary to Oxaliplatin, dose was reduced and eventually dc'd after cycle 11 -started cisplatin on 05/17/19, neuropathy worsened in her feet after that  -altered gait and functional limitations, decreased vibratory sense on tuning fork exam 05/31/19.  -increased gabapentin and dose-reduced cisplatin   Disposition:  Ms. Chovan appears stable. She completed first cycle of cisplatin/gemcitabine (cycle 13 chemo overall). She tolerated well except more fatigue and worsening CIPN. Hard to tell if neuropathy worsened from cisplatin or delayed effect of Oxaliplatin. She has functional limitations, unsteady gait at times, and has decreased vibratory sense on exam. I am recommending to dose-reduce cisplatin today and increase gabapentin to  300 mg qHS. I reviewed the plan with Dr. Burr Medico. Will monitor closely. I informed her that we may have to further reduce or discontinue cisplatin if CIPN worsens, as this can be irreversible. She declined referral to PT for balance/ambulation.   She otherwise did well. CBC improved after RBC transfusion. CMP stable. Labs adequate for treatment. She will proceed with cycle 2 cisplatin and gemcitabine today. F/u in 2 weeks with next cycle.   All questions were answered. The patient knows to call the clinic with any problems, questions or concerns. No barriers to learning were detected.     Alla Feeling, NP 05/31/19

## 2019-05-31 ENCOUNTER — Inpatient Hospital Stay: Payer: Medicare Other | Admitting: Nutrition

## 2019-05-31 ENCOUNTER — Inpatient Hospital Stay: Payer: Medicare Other

## 2019-05-31 ENCOUNTER — Encounter: Payer: Self-pay | Admitting: Nurse Practitioner

## 2019-05-31 ENCOUNTER — Other Ambulatory Visit: Payer: Self-pay

## 2019-05-31 ENCOUNTER — Inpatient Hospital Stay (HOSPITAL_BASED_OUTPATIENT_CLINIC_OR_DEPARTMENT_OTHER): Payer: Medicare Other | Admitting: Nurse Practitioner

## 2019-05-31 DIAGNOSIS — C221 Intrahepatic bile duct carcinoma: Secondary | ICD-10-CM | POA: Diagnosis not present

## 2019-05-31 DIAGNOSIS — D649 Anemia, unspecified: Secondary | ICD-10-CM | POA: Diagnosis not present

## 2019-05-31 DIAGNOSIS — C786 Secondary malignant neoplasm of retroperitoneum and peritoneum: Secondary | ICD-10-CM | POA: Diagnosis not present

## 2019-05-31 DIAGNOSIS — Z5111 Encounter for antineoplastic chemotherapy: Secondary | ICD-10-CM | POA: Diagnosis not present

## 2019-05-31 LAB — CBC WITH DIFFERENTIAL (CANCER CENTER ONLY)
Abs Immature Granulocytes: 0.02 10*3/uL (ref 0.00–0.07)
Basophils Absolute: 0 10*3/uL (ref 0.0–0.1)
Basophils Relative: 0 %
Eosinophils Absolute: 0.1 10*3/uL (ref 0.0–0.5)
Eosinophils Relative: 2 %
HCT: 30.6 % — ABNORMAL LOW (ref 36.0–46.0)
Hemoglobin: 9.8 g/dL — ABNORMAL LOW (ref 12.0–15.0)
Immature Granulocytes: 0 %
Lymphocytes Relative: 16 %
Lymphs Abs: 0.9 10*3/uL (ref 0.7–4.0)
MCH: 29.6 pg (ref 26.0–34.0)
MCHC: 32 g/dL (ref 30.0–36.0)
MCV: 92.4 fL (ref 80.0–100.0)
Monocytes Absolute: 0.7 10*3/uL (ref 0.1–1.0)
Monocytes Relative: 12 %
Neutro Abs: 4.1 10*3/uL (ref 1.7–7.7)
Neutrophils Relative %: 70 %
Platelet Count: 124 10*3/uL — ABNORMAL LOW (ref 150–400)
RBC: 3.31 MIL/uL — ABNORMAL LOW (ref 3.87–5.11)
RDW: 18.6 % — ABNORMAL HIGH (ref 11.5–15.5)
WBC Count: 5.9 10*3/uL (ref 4.0–10.5)
nRBC: 0 % (ref 0.0–0.2)

## 2019-05-31 LAB — CMP (CANCER CENTER ONLY)
ALT: 10 U/L (ref 0–44)
AST: 31 U/L (ref 15–41)
Albumin: 3.1 g/dL — ABNORMAL LOW (ref 3.5–5.0)
Alkaline Phosphatase: 61 U/L (ref 38–126)
Anion gap: 7 (ref 5–15)
BUN: 16 mg/dL (ref 8–23)
CO2: 25 mmol/L (ref 22–32)
Calcium: 8.5 mg/dL — ABNORMAL LOW (ref 8.9–10.3)
Chloride: 108 mmol/L (ref 98–111)
Creatinine: 1.07 mg/dL — ABNORMAL HIGH (ref 0.44–1.00)
GFR, Est AFR Am: 57 mL/min — ABNORMAL LOW (ref >60)
GFR, Estimated: 49 mL/min — ABNORMAL LOW (ref >60)
Glucose, Bld: 200 mg/dL — ABNORMAL HIGH (ref 70–99)
Potassium: 3.9 mmol/L (ref 3.5–5.1)
Sodium: 140 mmol/L (ref 135–145)
Total Bilirubin: 0.4 mg/dL (ref 0.3–1.2)
Total Protein: 6.4 g/dL — ABNORMAL LOW (ref 6.5–8.1)

## 2019-05-31 MED ORDER — SODIUM CHLORIDE 0.9 % IV SOLN
15.0000 mg/m2 | Freq: Once | INTRAVENOUS | Status: AC
  Administered 2019-05-31: 26 mg via INTRAVENOUS
  Filled 2019-05-31: qty 26

## 2019-05-31 MED ORDER — SODIUM CHLORIDE 0.9% FLUSH
10.0000 mL | INTRAVENOUS | Status: DC | PRN
  Administered 2019-05-31: 10 mL
  Filled 2019-05-31: qty 10

## 2019-05-31 MED ORDER — DEXAMETHASONE SODIUM PHOSPHATE 10 MG/ML IJ SOLN
INTRAMUSCULAR | Status: AC
  Filled 2019-05-31: qty 1

## 2019-05-31 MED ORDER — SODIUM CHLORIDE 0.9 % IV SOLN
Freq: Once | INTRAVENOUS | Status: AC
  Filled 2019-05-31: qty 250

## 2019-05-31 MED ORDER — DEXAMETHASONE SODIUM PHOSPHATE 10 MG/ML IJ SOLN
10.0000 mg | Freq: Once | INTRAMUSCULAR | Status: AC
  Administered 2019-05-31: 10 mg via INTRAVENOUS

## 2019-05-31 MED ORDER — PALONOSETRON HCL INJECTION 0.25 MG/5ML
INTRAVENOUS | Status: AC
  Filled 2019-05-31: qty 5

## 2019-05-31 MED ORDER — HEPARIN SOD (PORK) LOCK FLUSH 100 UNIT/ML IV SOLN
500.0000 [IU] | Freq: Once | INTRAVENOUS | Status: AC | PRN
  Administered 2019-05-31: 500 [IU]
  Filled 2019-05-31: qty 5

## 2019-05-31 MED ORDER — PALONOSETRON HCL INJECTION 0.25 MG/5ML
0.2500 mg | Freq: Once | INTRAVENOUS | Status: AC
  Administered 2019-05-31: 0.25 mg via INTRAVENOUS

## 2019-05-31 MED ORDER — LIDOCAINE-PRILOCAINE 2.5-2.5 % EX CREA: TOPICAL_CREAM | CUTANEOUS | 1 refills | Status: DC

## 2019-05-31 MED ORDER — SODIUM CHLORIDE 0.9 % IV SOLN
150.0000 mg | Freq: Once | INTRAVENOUS | Status: AC
  Administered 2019-05-31: 150 mg via INTRAVENOUS
  Filled 2019-05-31: qty 150

## 2019-05-31 MED ORDER — POTASSIUM CHLORIDE 2 MEQ/ML IV SOLN
Freq: Once | INTRAVENOUS | Status: AC
  Filled 2019-05-31: qty 10

## 2019-05-31 MED ORDER — SODIUM CHLORIDE 0.9 % IV SOLN
600.0000 mg/m2 | Freq: Once | INTRAVENOUS | Status: AC
  Administered 2019-05-31: 1064 mg via INTRAVENOUS
  Filled 2019-05-31: qty 27.98

## 2019-05-31 NOTE — Patient Instructions (Signed)
[facility] Discharge Instructions for Patients Receiving Chemotherapy  Today you received the following chemotherapy agents Cisplatin and Gemzar  To help prevent nausea and vomiting after your treatment, we encourage you to take your nausea medication as directed.    If you develop nausea and vomiting that is not controlled by your nausea medication, call the clinic.   BELOW ARE SYMPTOMS THAT SHOULD BE REPORTED IMMEDIATELY:  *FEVER GREATER THAN 100.5 F  *CHILLS WITH OR WITHOUT FEVER  NAUSEA AND VOMITING THAT IS NOT CONTROLLED WITH YOUR NAUSEA MEDICATION  *UNUSUAL SHORTNESS OF BREATH  *UNUSUAL BRUISING OR BLEEDING  TENDERNESS IN MOUTH AND THROAT WITH OR WITHOUT PRESENCE OF ULCERS  *URINARY PROBLEMS  *BOWEL PROBLEMS  UNUSUAL RASH Items with * indicate a potential emergency and should be followed up as soon as possible.  Feel free to call the clinic should you have any questions or concerns. The clinic phone number is [phone].  Please show the [facility] at check-in to the Emergency Department and triage nurse.  Cisplatin injection What is this medicine? CISPLATIN (SIS pla tin) is a chemotherapy drug. It targets fast dividing cells, like cancer cells, and causes these cells to die. This medicine is used to treat many types of cancer like bladder, ovarian, and testicular cancers. This medicine may be used for other purposes; ask your health care provider or pharmacist if you have questions. COMMON BRAND NAME(S): Platinol, Platinol -AQ What should I tell my health care provider before I take this medicine? They need to know if you have any of these conditions:  eye disease, vision problems  hearing problems  kidney disease  low blood counts, like white cells, platelets, or red blood cells  tingling of the fingers or toes, or other nerve disorder  an unusual or allergic reaction to cisplatin, carboplatin, oxaliplatin, other medicines, foods,  dyes, or preservatives  pregnant or trying to get pregnant  breast-feeding How should I use this medicine? This drug is given as an infusion into a vein. It is administered in a hospital or clinic by a specially trained health care professional. Talk to your pediatrician regarding the use of this medicine in children. Special care may be needed. Overdosage: If you think you have taken too much of this medicine contact a poison control center or emergency room at once. NOTE: This medicine is only for you. Do not share this medicine with others. What if I miss a dose? It is important not to miss a dose. Call your doctor or health care professional if you are unable to keep an appointment. What may interact with this medicine? This medicine may interact with the following medications:  foscarnet  certain antibiotics like amikacin, gentamicin, neomycin, polymyxin B, streptomycin, tobramycin, vancomycin This list may not describe all possible interactions. Give your health care provider a list of all the medicines, herbs, non-prescription drugs, or dietary supplements you use. Also tell them if you smoke, drink alcohol, or use illegal drugs. Some items may interact with your medicine. What should I watch for while using this medicine? Your condition will be monitored carefully while you are receiving this medicine. You will need important blood work done while you are taking this medicine. This drug may make you feel generally unwell. This is not uncommon, as chemotherapy can affect healthy cells as well as cancer cells. Report any side effects. Continue your course of treatment even though you feel ill unless your doctor tells you to stop. This medicine  may increase your risk of getting an infection. Call your healthcare professional for advice if you get a fever, chills, or sore throat, or other symptoms of a cold or flu. Do not treat yourself. Try to avoid being around people who are sick. Avoid  taking medicines that contain aspirin, acetaminophen, ibuprofen, naproxen, or ketoprofen unless instructed by your healthcare professional. These medicines may hide a fever. This medicine may increase your risk to bruise or bleed. Call your doctor or health care professional if you notice any unusual bleeding. Be careful brushing and flossing your teeth or using a toothpick because you may get an infection or bleed more easily. If you have any dental work done, tell your dentist you are receiving this medicine. Do not become pregnant while taking this medicine or for 14 months after stopping it. Women should inform their healthcare professional if they wish to become pregnant or think they might be pregnant. Men should not father a child while taking this medicine and for 11 months after stopping it. There is potential for serious side effects to an unborn child. Talk to your healthcare professional for more information. Do not breast-feed an infant while taking this medicine. This medicine has caused ovarian failure in some women. This medicine may make it more difficult to get pregnant. Talk to your healthcare professional if you are concerned about your fertility. This medicine has caused decreased sperm counts in some men. This may make it more difficult to father a child. Talk to your healthcare professional if you are concerned about your fertility. Drink fluids as directed while you are taking this medicine. This will help protect your kidneys. Call your doctor or health care professional if you get diarrhea. Do not treat yourself. What side effects may I notice from receiving this medicine? Side effects that you should report to your doctor or health care professional as soon as possible:  allergic reactions like skin rash, itching or hives, swelling of the face, lips, or tongue  blurred vision  changes in vision  decreased hearing or ringing of the ears  nausea, vomiting  pain,  redness, or irritation at site where injected  pain, tingling, numbness in the hands or feet  signs and symptoms of bleeding such as bloody or black, tarry stools; red or dark brown urine; spitting up blood or brown material that looks like coffee grounds; red spots on the skin; unusual bruising or bleeding from the eyes, gums, or nose  signs and symptoms of infection like fever; chills; cough; sore throat; pain or trouble passing urine  signs and symptoms of kidney injury like trouble passing urine or change in the amount of urine  signs and symptoms of low red blood cells or anemia such as unusually weak or tired; feeling faint or lightheaded; falls; breathing problems Side effects that usually do not require medical attention (report to your doctor or health care professional if they continue or are bothersome):  loss of appetite  mouth sores  muscle cramps This list may not describe all possible side effects. Call your doctor for medical advice about side effects. You may report side effects to FDA at 1-800-FDA-1088. Where should I keep my medicine? This drug is given in a hospital or clinic and will not be stored at home. NOTE: This sheet is a summary. It may not cover all possible information. If you have questions about this medicine, talk to your doctor, pharmacist, or health care provider.  2020 [employer]/Gold Standard (2018-02-25 15:59:17)  Gemcitabine injection What is this medicine? GEMCITABINE (jem SYE ta been) is a chemotherapy drug. This medicine is used to treat many types of cancer like breast cancer, lung cancer, pancreatic cancer, and ovarian cancer. This medicine may be used for other purposes; ask your health care provider or pharmacist if you have questions. COMMON BRAND NAME(S): Gemzar, Infugem What should I tell my health care provider before I take this medicine? They need to know if you have any of these conditions:  blood disorders  infection  kidney  disease  liver disease  lung or breathing disease, like asthma  recent or ongoing radiation therapy  an unusual or allergic reaction to gemcitabine, other chemotherapy, other medicines, foods, dyes, or preservatives  pregnant or trying to get pregnant  breast-feeding How should I use this medicine? This drug is given as an infusion into a vein. It is administered in a hospital or clinic by a specially trained health care professional. Talk to your pediatrician regarding the use of this medicine in children. Special care may be needed. Overdosage: If you think you have taken too much of this medicine contact a poison control center or emergency room at once. NOTE: This medicine is only for you. Do not share this medicine with others. What if I miss a dose? It is important not to miss your dose. Call your doctor or health care professional if you are unable to keep an appointment. What may interact with this medicine?  medicines to increase blood counts like filgrastim, pegfilgrastim, sargramostim  some other chemotherapy drugs like cisplatin  vaccines Talk to your doctor or health care professional before taking any of these medicines:  acetaminophen  aspirin  ibuprofen  ketoprofen  naproxen This list may not describe all possible interactions. Give your health care provider a list of all the medicines, herbs, non-prescription drugs, or dietary supplements you use. Also tell them if you smoke, drink alcohol, or use illegal drugs. Some items may interact with your medicine. What should I watch for while using this medicine? Visit your doctor for checks on your progress. This drug may make you feel generally unwell. This is not uncommon, as chemotherapy can affect healthy cells as well as cancer cells. Report any side effects. Continue your course of treatment even though you feel ill unless your doctor tells you to stop. In some cases, you may be given additional medicines to  help with side effects. Follow all directions for their use. Call your doctor or health care professional for advice if you get a fever, chills or sore throat, or other symptoms of a cold or flu. Do not treat yourself. This drug decreases your body's ability to fight infections. Try to avoid being around people who are sick. This medicine may increase your risk to bruise or bleed. Call your doctor or health care professional if you notice any unusual bleeding. Be careful brushing and flossing your teeth or using a toothpick because you may get an infection or bleed more easily. If you have any dental work done, tell your dentist you are receiving this medicine. Avoid taking products that contain aspirin, acetaminophen, ibuprofen, naproxen, or ketoprofen unless instructed by your doctor. These medicines may hide a fever. Do not become pregnant while taking this medicine or for 6 months after stopping it. Women should inform their doctor if they wish to become pregnant or think they might be pregnant. Men should not father a child while taking this medicine and for 3 months after stopping  it. There is a potential for serious side effects to an unborn child. Talk to your health care professional or pharmacist for more information. Do not breast-feed an infant while taking this medicine or for at least 1 week after stopping it. Men should inform their doctors if they wish to father a child. This medicine may lower sperm counts. Talk with your doctor or health care professional if you are concerned about your fertility. What side effects may I notice from receiving this medicine? Side effects that you should report to your doctor or health care professional as soon as possible:  allergic reactions like skin rash, itching or hives, swelling of the face, lips, or tongue  breathing problems  pain, redness, or irritation at site where injected  signs and symptoms of a dangerous change in heartbeat or heart  rhythm like chest pain; dizziness; fast or irregular heartbeat; palpitations; feeling faint or lightheaded, falls; breathing problems  signs of decreased platelets or bleeding - bruising, pinpoint red spots on the skin, black, tarry stools, blood in the urine  signs of decreased red blood cells - unusually weak or tired, feeling faint or lightheaded, falls  signs of infection - fever or chills, cough, sore throat, pain or difficulty passing urine  signs and symptoms of kidney injury like trouble passing urine or change in the amount of urine  signs and symptoms of liver injury like dark yellow or brown urine; general ill feeling or flu-like symptoms; light-colored stools; loss of appetite; nausea; right upper belly pain; unusually weak or tired; yellowing of the eyes or skin  swelling of ankles, feet, hands Side effects that usually do not require medical attention (report to your doctor or health care professional if they continue or are bothersome):  constipation  diarrhea  hair loss  loss of appetite  nausea  rash  vomiting This list may not describe all possible side effects. Call your doctor for medical advice about side effects. You may report side effects to FDA at 1-800-FDA-1088. Where should I keep my medicine? This drug is given in a hospital or clinic and will not be stored at home. NOTE: This sheet is a summary. It may not cover all possible information. If you have questions about this medicine, talk to your doctor, pharmacist, or health care provider.  2020 [employer]vier/Gold Standard (2017-05-26 18:06:11)

## 2019-05-31 NOTE — Progress Notes (Signed)
Nutrition follow-up completed with patient during infusion for cholangiocarcinoma with peritoneal metastases. Weight decreased slightly and was documented as 142.1 pounds March 17 down from 144.8 pounds on February 17. Patient reports her appetite is normal. She is not much of an eater. She denies nausea and vomiting. Reports she occasionally drinks Glucerna or a different protein shake. States she does not drink enough water. Labs were reviewed.  Nutrition diagnosis: Inadequate oral intake continues.  Intervention: Educated patient on the importance of smaller more frequent meals and snacks. Recommended patient consume Glucerna between meals as tolerated. Provided samples and coupons. Reviewed importance of increasing fluids and educated patient on how to achieve increased hydration. Questions were answered and teach back method used.  Patient has my contact information.  Monitoring, evaluation, goals: Patient will tolerate increased calories and protein to minimize weight loss.  Next visit: To be scheduled in approximately 1 month.  **Disclaimer: This note was dictated with voice recognition software. Similar sounding words can inadvertently be transcribed and this note may contain transcription errors which may not have been corrected upon publication of note.**

## 2019-06-01 ENCOUNTER — Telehealth: Payer: Self-pay | Admitting: Nurse Practitioner

## 2019-06-01 NOTE — Telephone Encounter (Signed)
Scheduled appt per 3/17 los. °

## 2019-06-08 ENCOUNTER — Other Ambulatory Visit: Payer: Self-pay | Admitting: Gastroenterology

## 2019-06-08 NOTE — Progress Notes (Signed)
Pharmacist Chemotherapy Monitoring - Follow Up Assessment    I verify that I have reviewed each item in the below checklist:  . Regimen for the patient is scheduled for the appropriate day and plan matches scheduled date. Marland Kitchen Appropriate non-routine labs are ordered dependent on drug ordered. . If applicable, additional medications reviewed and ordered per protocol based on lifetime cumulative doses and/or treatment regimen.   Plan for follow-up and/or issues identified: No . I-vent associated with next due treatment: No    Kennith Center, Pharm.D., CPP 06/08/2019@3 :56 PM

## 2019-06-12 ENCOUNTER — Other Ambulatory Visit: Payer: Self-pay | Admitting: *Deleted

## 2019-06-13 ENCOUNTER — Other Ambulatory Visit: Payer: Self-pay | Admitting: Nurse Practitioner

## 2019-06-13 DIAGNOSIS — C221 Intrahepatic bile duct carcinoma: Secondary | ICD-10-CM

## 2019-06-13 NOTE — Progress Notes (Signed)
[facility]   [phone] [phone]   Clinic Follow up Note   Patient Care Team: [name], MD as PCP - General (Family Medicine) [name], [name] Dad, MD as Consulting Physician (Oncology) [name], [name], MD as Consulting Physician (Cardiology) [name], MD as Consulting Physician (Obstetrics and Gynecology) [name], [name], NP as Nurse Practitioner (Hematology and Oncology) [facility] [name], MD as Consulting Physician (Hematology) [name], [name], MD as Consulting Physician (Gastroenterology) [name], RN (Inactive) as Oncology Nurse Navigator [name], MD as Consulting Physician (General Surgery) 06/14/2019  CHIEF COMPLAINT: F/u cholangiocarcinoma   SUMMARY OF ONCOLOGIC HISTORY: Oncology History  Malignant neoplasm of female breast ([place])  11/06/2018 Genetic Testing   Negative genetic testing on the [facility] Common Hereditary Cancers panel. A variant of uncertain significance was identified in one of her APC genes, called c.1243G>A (p.Ala415Thr).  The Common Hereditary Cancers Panel offered by [facility] includes sequencing and/or deletion duplication testing of the following 48 genes: APC, ATM, AXIN2, BARD1, BMPR1A, BRCA1, BRCA2, BRIP1, CDH1, CDK4, CDKN2A (p14ARF), CDKN2A (p16INK4a), CHEK2, CTNNA1, DICER1, EPCAM (Deletion/duplication testing only), GREM1 (promoter region deletion/duplication testing only), KIT, MEN1, MLH1, MSH2, MSH3, MSH6, MUTYH, NBN, NF1, NHTL1, PALB2, PDGFRA, PMS2, POLD1, POLE, PTEN, RAD50, RAD51C, RAD51D, RNF43, SDHB, SDHC, SDHD, SMAD4, SMARCA4. STK11, TP53, TSC1, TSC2, and VHL.  The following genes were evaluated for sequence changes only: SDHA and HOXB13 c.251G>A variant only.    Intrahepatic cholangiocarcinoma ([place])  09/07/2018 Imaging   CT Chest IMPRESSION: 1. New, enhancing mass involving segment 4 of the liver and fundus of gallbladder is concerning for malignancy.  This may represent either metastatic disease from breast cancer or neoplasm primary to the liver or hepatic biliary tree. Further evaluation with contrast enhanced CT of the abdomen and pelvis is recommended. 2. No findings to suggest metastatic disease within the chest. 3.  Aortic Atherosclerosis (ICD10-I70.0). 4. Coronary artery calcifications.   09/13/2018 Pathology Results   Diagnosis Liver, needle/core biopsy - ADENOCARCINOMA. Microscopic Comment Immunohistochemistry for CK7 is positive. CK20, TTF1, CDX-2, GATA-3, PAX 8, Qualitative ER, p63 and CK5/6 are negative. The provided clinical history of remote mammary carcinoma is noted. Based on the morphology and immunophenotype of the adenocarcinoma observed in this specimen, primary cholangiocarcinoma is favored. Clinical and radiologic correlation are  encouraged. Results reported to [employer] on 09/15/2018. Intradepartmental consultation (Dr. Vic Ripper).   09/13/2018 Initial Diagnosis   Cholangiocarcinoma ([place])   09/23/2018 Procedure   Colonoscopy by Dr. Havery Moros 09/23/18  IMPRESSION - Two 3 to 4 mm polyps in the ascending colon, removed with a cold snare. Resected and retrieved. - Five 3 to 5 mm polyps in the transverse colon, removed with a cold snare. Resected and retrieved. - One 5 mm polyp at the splenic flexure, removed with a cold snare. Resected and retrieved. - Three 3 to 5 mm polyps in the sigmoid colon, removed with a cold snare. Resected and retrieved. - The examination was otherwise normal. Upper Endopscy by Dr. Havery Moros 09/23/18  IMPRESSION - Esophagogastric landmarks identified. - 2 cm hiatal hernia. - Normal esophagus otherwise. - A single gastric polyp. Resected and retrieved. - Mild gastritis. Biopsied. - Normal duodenal bulb and second portion of the duodenum.   09/23/2018 Pathology Results   Diagnosis 09/23/18 1. Surgical [P], duodenum - BENIGN SMALL BOWEL MUCOSA. - NO ACTIVE INFLAMMATION OR VILLOUS  ATROPHY IDENTIFIED. 2. Surgical [P], stomach, polyp - HYPERPLASTIC POLYP(S). - THERE IS NO EVIDENCE OF MALIGNANCY. 3. Surgical [P], gastric  antrum and gastric body - CHRONIC INACTIVE GASTRITIS. - THERE IS NO EVIDENCE OF HELICOBACTER-PYLORI, DYSPLASIA, OR MALIGNANCY. - SEE COMMENT. 4. Surgical [P], colon, sigmoid, splenic flexure, transverse and ascending, polyp (9) - TUBULAR ADENOMA(S). - SESSILE SERRATED POLYP WITHOUT CYTOLOGIC DYSPLASIA. - HIGH GRADE DYSPLASIA IS NOT IDENTIFIED. 5. Surgical [P], colon, sigmoid, polyp (2) - HYPERPLASTIC POLYP(S). - THERE IS NO EVIDENCE OF MALIGNANCY.   09/23/2018 Cancer Staging   Staging form: Intrahepatic Bile Duct, AJCC 8th Edition - Clinical stage from 09/23/2018: Stage IB (cT1b, cN0, cM0) - Signed by [name], MD on 10/06/2018   09/29/2018 PET scan   PET 09/29/18 IMPRESSION: 1. Hypermetabolic mass in the RIGHT hepatic lobe consistent with biopsy proven adenocarcinoma. No additional liver metastasis. 2. No evidence of local breast cancer recurrence in the RIGHT breast or RIGHT axilla. 3. Mild bilateral hypermetabolic adrenal glands is favored benign hyperplasia. 4. No evidence of additional metastatic disease on skull base to thigh FDG PET scan.   11/06/2018 Genetic Testing   Negative genetic testing on the [facility] Common Hereditary Cancers panel. A variant of uncertain significance was identified in one of her APC genes, called c.1243G>A (p.Ala415Thr).  The Common Hereditary Cancers Panel offered by [facility] includes sequencing and/or deletion duplication testing of the following 48 genes: APC, ATM, AXIN2, BARD1, BMPR1A, BRCA1, BRCA2, BRIP1, CDH1, CDK4, CDKN2A (p14ARF), CDKN2A (p16INK4a), CHEK2, CTNNA1, DICER1, EPCAM (Deletion/duplication testing only), GREM1 (promoter region deletion/duplication testing only), KIT, MEN1, MLH1, MSH2, MSH3, MSH6, MUTYH, NBN, NF1, NHTL1, PALB2, PDGFRA, PMS2, POLD1, POLE, PTEN, RAD50, RAD51C, RAD51D, RNF43, SDHB, SDHC,  SDHD, SMAD4, SMARCA4. STK11, TP53, TSC1, TSC2, and VHL.  The following genes were evaluated for sequence changes only: SDHA and HOXB13 c.251G>A variant only.    11/17/2018 Pathology Results   Diagnosis 11/17/18 1. Soft tissue, biopsy, Diaphragmatic nodules - METASTATIC ADENOCARCINOMA, CONSISTENT WITH PATIENT'S CLINICAL HISTORY OF CHOLANGIOCARCINOMA. SEE NOTE 2. Liver, biopsy, Left - LIVER PARENCHYMA WITH A BENIGN FIBROTIC NODULE - NO EVIDENCE OF MALIGNANCY 3. Stomach, biopsy - BENIGN PAPILLARY MESOTHELIAL HYPERPLASIA - NO EVIDENCE OF MALIGNANCY   11/29/2018 Imaging   CT CAP WO Contrast  IMPRESSION: 1. Dominant liver mass appears grossly stable from 09/29/2018. Additional liver lesions are too small to characterize but were not shown to be hypermetabolic on PET. 2. Mild nodularity of both adrenal glands with associated hypermetabolism on 09/29/2018. Continued attention on follow-up exams is warranted. 3. Small right lower lobe nodules, stable from 09/07/2018. Again, attention on follow-up is recommended. 4. Trace bilateral pleural fluid. 5. Aortic atherosclerosis (ICD10-170.0). Coronary artery calcification. 6. Enlarged pulmonic trunk, indicative of pulmonary arterial hypertension.     11/30/2018 -  Chemotherapy   First line chemo Oxaliplatin and gemcitabine q2weeks starting 11/30/18. Stopped Oxaliplatin on 05/03/19 due to worsening neuropathy. May add Cisplatin with C13 on 3/3.    01/20/2019 Imaging   CT CAP IMPRESSION: restaging  1. Mild interval increase in size of dominant liver mass involving segment 4 and segment 5. 2. No significant or progressive adrenal nodularity identified to suggest metastatic disease. 3. Unchanged appearance of small right lower lobe and lingular lung nodules, nonspecific.   03/21/2019 PET scan   IMPRESSION: 1. Large right hepatic mass with SUV uptake near background hepatic activity, dramatic response to therapy, also with decrease in size when  compared to the prior study. 2. Signs of prior right mastectomy and axillary dissection as before. 3. Left adrenal activity in no longer above the level of activity seen in the contralateral, right adrenal gland or  in the liver. Attention on follow-up. 4. No new signs of disease.     CURRENT THERAPY:  First line chemoOxaliplatinand gemcitabine q2weeksstarting 11/30/18.Stopped Oxaliplatin on 05/03/19 due to worsening neuropathy.AddedCisplatin with C13 on 3/3.  INTERVAL HISTORY: Ms. Hogan returns for f/u and treatment as scheduled. She completed cycle 2 dose-reduced cisplatin/gemcitabine on 05/31/19. Neuropathy is worse in her hands, fingertips are constantly numb, dropping things and can't write. Feet are stable. She is using a cane. Denies fall. She increased gabapentin to 300 mg at night which isn't really helping. It makes her drowsy but otherwise tolerates it. Otherwise, she denies fever, chills, cough, chest pain, dyspnea, n/v/c/d, mucositis, or pain.    MEDICAL HISTORY:  Past Medical History:  Diagnosis Date  . Anemia   . Anxiety   . Arthritis   . Breast cancer ([place]) 1992  . Cataract    Bilateral eyes - surgery to remove  . Chronic kidney disease    CKD stage 3  . Complication of anesthesia    "something they use make me itch for a couple of days."  . Depression   . Diabetes mellitus    type 2  . Duodenitis 01/18/2002  . Fainting spell   . Family history of breast cancer   . Family history of prostate cancer   . GERD (gastroesophageal reflux disease)   . Heart murmur    never has caused any problems  . Hiatal hernia 08/08/2008, 01/18/2002  . History of pneumonia    x 2  . Hyperlipidemia   . Hypertension   . Liver cancer ([place]) 08/2018  . Lymphedema 2017   Right arm  . Pneumonia    x 2  . UTI (lower urinary tract infection)     SURGICAL HISTORY: Past Surgical History:  Procedure Laterality Date  . AXILLARY SURGERY     cyst removal, right  . COLONOSCOPY   09/23/2018   Dr. Havery Moros - polyps  . EYE SURGERY Bilateral    cataracts to remove  . LAPAROSCOPY N/A 11/17/2018   Procedure: LAPAROSCOPY DIAGNOSTIC, INTRAOPERATIVE ULTRASOUND, PERITONEAL BIOPSIES;  Surgeon: [name], MD;  Location: [facility];  Service: General;  Laterality: N/A;  GENERAL AND EPIDURAL  . LIVER BIOPSY  08/2018   Dr. Lindwood Coke  . MASTECTOMY  1992   right, with flap  . NM MYOCAR PERF WALL MOTION  06/11/2009   Protocol:[place], post stress EF58%, EKG negative for ischemia, low risk  . PORTACATH PLACEMENT N/A 12/02/2018   Procedure: INSERTION PORT-A-CATH;  Surgeon: [name], MD;  Location: [facility];  Service: General;  Laterality: N/A;  . RECONSTRUCTION BREAST W/ TRAM FLAP Right   . TONSILLECTOMY    . TRANSTHORACIC ECHOCARDIOGRAM  12/24/2009   LVEF =>55%, normal study  . UPPER GI ENDOSCOPY      I have reviewed the social history and family history with the patient and they are unchanged from previous note.  ALLERGIES:  has No Known Allergies.  MEDICATIONS:  Current Outpatient Medications  Medication Sig Dispense Refill  . amLODipine (NORVASC) 10 MG tablet TAKE 1 TABLET BY MOUTH EVERY DAY 90 tablet 3  . aspirin 81 MG tablet Take 81 mg by mouth at bedtime.     . Calcium Carbonate-Vitamin D 600-200 MG-UNIT TABS Take 1 tablet by mouth daily.    Marland Kitchen CALCIUM-MAGNESIUM-VITAMIN D PO Take 1 tablet by mouth once a week.     . Cholecalciferol (VITAMIN D3) 2000 UNITS TABS Take 1 tablet by mouth once a week.     Marland Kitchen  DULoxetine (CYMBALTA) 30 MG capsule Take 1 capsule (30 mg total) by mouth daily. 30 capsule 0  . Echinacea-Goldenseal (ECHINACEA COMB/GOLDEN SEAL) CAPS Take by mouth.    . gabapentin (NEURONTIN) 100 MG capsule Take 1 capsule (100 mg total) by mouth at bedtime. (Patient taking differently: Take 300 mg by mouth at bedtime. ) 90 capsule 1  . glucose blood test strip 1 each by Other route 2 (two) times daily. Use Onetouch verio test strips as instructed to check blood sugar twice  daily. 100 each 2  . hydrochlorothiazide (HYDRODIURIL) 25 MG tablet Take 1 tablet (25 mg total) by mouth daily. 90 tablet 3  . HYDROcodone-acetaminophen (NORCO/VICODIN) 5-325 MG tablet Take 1 tablet by mouth every 6 (six) hours as needed for moderate pain. 10 tablet 0  . ketoconazole (NIZORAL) 2 % cream Apply 1 fingertip amount to each foot daily. 30 g 0  . lansoprazole (PREVACID) 15 MG capsule Take 1 capsule (15 mg total) by mouth daily. 90 capsule 1  . lidocaine-prilocaine (EMLA) cream Apply to affected area once 90 g 1  . loratadine (CLARITIN) 10 MG tablet Take 10 mg by mouth daily as needed for allergies.    . metFORMIN (GLUCOPHAGE-XR) 500 MG 24 hr tablet Take 1 tablet (500 mg total) by mouth 2 (two) times daily before a meal. 180 tablet 1  . metoprolol succinate (TOPROL-XL) 100 MG 24 hr tablet Take 1 tablet (100 mg total) by mouth daily. Take with or immediately following a meal. 90 tablet 1  . Omega 3 1000 MG CAPS Take 2,000 mg by mouth daily.     . ondansetron (ZOFRAN) 8 MG tablet Take 1 tablet (8 mg total) by mouth every 8 (eight) hours as needed for nausea or vomiting. (Patient not taking: Reported on 05/31/2019) 30 tablet 0  . Prenatal Vit-Fe Fumarate-FA (MULTIVITAMIN-PRENATAL) 27-0.8 MG TABS tablet Take 1 tablet by mouth daily at 12 noon.    . prochlorperazine (COMPAZINE) 10 MG tablet Take 1 tablet (10 mg total) by mouth every 6 (six) hours as needed for nausea or vomiting. (Patient not taking: Reported on 05/31/2019) 30 tablet 0  . raloxifene (EVISTA) 60 MG tablet TAKE 1 TABLET DAILY 90 tablet 0  . rosuvastatin (CRESTOR) 20 MG tablet Take 1 tablet (20 mg total) by mouth daily. 90 tablet 1  . sitaGLIPtin (JANUVIA) 100 MG tablet Take 1 tablet (100 mg total) by mouth daily. 90 tablet 4  . triamcinolone cream (KENALOG) 0.1 % Apply 1 application topically 2 (two) times daily. (Patient not taking: Reported on 05/31/2019) 30 g 0  . valsartan (DIOVAN) 320 MG tablet Take 1 tablet (320 mg total) by  mouth daily. 90 tablet 3   Current Facility-Administered Medications  Medication Dose Route Frequency Provider Last Rate Last Admin  . triamcinolone acetonide (KENALOG) 10 MG/ML injection 10 mg  10 mg Other Once Harriet Masson, DPM        PHYSICAL EXAMINATION: ECOG PERFORMANCE STATUS: 1 - Symptomatic but completely ambulatory  Vitals:   06/14/19 0821  BP: (!) 133/101  Pulse: 80  Resp: 16  Temp: 98.1 F (36.7 C)  SpO2: 100%   Filed Weights   06/14/19 0821  Weight: 144 lb 1.6 oz (65.4 kg)    GENERAL:alert, no distress and comfortable SKIN: no rash  EYES:  sclera clear LUNGS:  normal breathing effort HEART: no lower extremity edema NEURO: alert & oriented x 3 with fluent speech, unsteady gait, using cane. Decreased peripheral vibratory sense.  PAC without erythema  LABORATORY DATA:  I have reviewed the data as listed CBC Latest Ref Rng & Units 06/14/2019 05/31/2019 05/24/2019  WBC 4.0 - 10.5 K/uL 5.0 5.9 2.0(L)  Hemoglobin 12.0 - 15.0 g/dL 8.8(L) 9.8(L) 7.7(L)  Hematocrit 36.0 - 46.0 % 28.0(L) 30.6(L) 24.3(L)  Platelets 150 - 400 K/uL 152 124(L) 180     CMP Latest Ref Rng & Units 06/14/2019 05/31/2019 05/24/2019  Glucose 70 - 99 mg/dL 135(H) 200(H) 152(H)  BUN 8 - 23 mg/dL '20 16 15  '$ Creatinine 0.44 - 1.00 mg/dL 1.27(H) 1.07(H) 1.12(H)  Sodium 135 - 145 mmol/L 143 140 141  Potassium 3.5 - 5.1 mmol/L 3.7 3.9 3.9  Chloride 98 - 111 mmol/L 108 108 106  CO2 22 - 32 mmol/L '27 25 28  '$ Calcium 8.9 - 10.3 mg/dL 8.7(L) 8.5(L) 8.7(L)  Total Protein 6.5 - 8.1 g/dL 6.6 6.4(L) 6.4(L)  Total Bilirubin 0.3 - 1.2 mg/dL 0.4 0.4 0.3  Alkaline Phos 38 - 126 U/L 62 61 57  AST 15 - 41 U/L 26 31 34  ALT 0 - 44 U/L '11 10 16      '$ RADIOGRAPHIC STUDIES: I have personally reviewed the radiological images as listed and agreed with the findings in the report. No results found.   ASSESSMENT & PLAN: NINEL ABDELLA [id number].femalewith   1.Intrahepatic cholangiocarcinoma, cT1N0M1,with  peritoneal metastasis, MSS, IDH1 mutation (+) -Diagnosed in 09/2018 biopsy of her liver mass shows adenocarcinoma,most consistent withcholangiocarcinoma -09/29/18 PET scanshowedno evidence of distant metastasis. -Unfortunately she was found to have peritoneal metastasis to right diaphragm and surgery was aborted. -HerCT AP from 9/15/20shows dominate liver mass stable to mild increase, mild nodularity of both adrenal glands which warrants being monitored.No visible peritoneal metastasis on CT scan. -Given her metastatic cancer and ineligibility forsurgery,she began first-line systemic chemotherapy for disease control. -FO resultsshowed MSI stable disease, IDH mutation positive.Consider IDH 1 inhibitor in the future.Not a candidate for immunotherapy -She beganfirst lineoxaliplatin(due to CKD)and gemcitabine every 2 weeks on 11/30/2018 -she responded very well to treatment and tolerated without significant toxicities except progressive neuropathy. Oxali was dose reduced and eventually stopped after cycle 11. She received single agent gemcitabine with cycle 12.  -Began Cisplatin with gemcitabine with cycle 13 on 05/17/19 -Ms. Joost appears stable. She completed cycle 2 dose-reduced cisplatin and gemcitabine. She tolerates treatment well except progressive neuropathy which did not improve after cisplatin dose reduction. I am recommending to hold cisplatin today, start cymbalta, and continue gabapentin 300 mg qHS.  -CBC and CMP adequate for treatment, she will get gemcitabine only today -we will restage her after this cycle.  -F/u in 2 weeks with next cycle.  2. H/o right breast cancer, Geneticsnegative -s/p mastectomy in 1992, per patient did not require adjuvant therapy  -previouslyfollowed by Dr. Jana Hakim -VUS of gene APC;genetics otherwise normal  3.Comorbidities:DM, HTN, hiatal hernia, GERD, renal artery stenosis -Follow-up with PCP, nephrology, and Dr.  Dwyane Dee -Crfluctuates, overall much improved fromJune- Sept this year (1.78 at highest). -I encouraged her to hydrate and avoid nephrotoxic agents. -will monitor closely on cisplatin, stable   4. Family support  -she has a son and daughterwho are supportive. -patient takes pride in remaining independent, but has become more reluctant to drive due to CIPN in her feet  5.Goal of care discussion  -Wefrequently discussthe goal of her treatment is palliative and that hercancer is not curable with chemo alone at this stage. The goal is to control her disease, prolong her life, and give her good quality of life.  She understands -she is full code now  6. CIPN, G2-3 -secondary to Oxaliplatin, dose was reduced and eventually dc'd after cycle 11 -started cisplatin on 05/17/19, neuropathy worsened in her feet after that with unsteady gait and functional limitations, decreased vibratory sense on tuning fork exam 05/31/19. Cisplatin was dose reduced for cycle 2 and she increased gabapentin  -CIPN worsened in her hands after cycle 2, with functional difficulties. I am recommending to hold cisplatin today and start cymbalta. We reviewed side effects, she agrees. Continue gabapentin   PLAN: -Labs reviewed -Proceed with single agent gemcitabine today, pre-med with compazine po -Hold cisplatin today for CIPN -start cymbalta 30 mg once daily - reviewed SE profile  -Continue gabapentin 300 mg qHS -Increase po hydration  -Restage after this cycle -F/u in 2 weeks   No problem-specific Assessment & Plan notes found for this encounter.   Orders Placed This Encounter  Procedures  . NM PET Image Restag (PS) Skull Base To Thigh    Standing Status:   Future    Standing Expiration Date:   06/13/2020    Order Specific Question:   If indicated for the ordered procedure, I authorize the administration of a radiopharmaceutical per Radiology protocol    Answer:   Yes    Order Specific Question:    Preferred imaging location?    Answer:   [facility]    Order Specific Question:   Radiology Contrast Protocol - do NOT remove file path    Answer:   [email or web address]   All questions were answered. The patient knows to call the clinic with any problems, questions or concerns. No barriers to learning were detected.    Alla Feeling, NP 06/14/19

## 2019-06-14 ENCOUNTER — Other Ambulatory Visit: Payer: Self-pay

## 2019-06-14 ENCOUNTER — Encounter: Payer: Self-pay | Admitting: Nurse Practitioner

## 2019-06-14 ENCOUNTER — Inpatient Hospital Stay: Payer: Medicare Other

## 2019-06-14 ENCOUNTER — Inpatient Hospital Stay (HOSPITAL_BASED_OUTPATIENT_CLINIC_OR_DEPARTMENT_OTHER): Payer: Medicare Other | Admitting: Nurse Practitioner

## 2019-06-14 VITALS — BP 133/101 | HR 80 | Temp 98.1°F | Resp 16 | Ht 65.0 in | Wt 144.1 lb

## 2019-06-14 VITALS — BP 153/66

## 2019-06-14 DIAGNOSIS — G620 Drug-induced polyneuropathy: Secondary | ICD-10-CM

## 2019-06-14 DIAGNOSIS — C221 Intrahepatic bile duct carcinoma: Secondary | ICD-10-CM

## 2019-06-14 DIAGNOSIS — C786 Secondary malignant neoplasm of retroperitoneum and peritoneum: Secondary | ICD-10-CM | POA: Diagnosis not present

## 2019-06-14 DIAGNOSIS — Z5111 Encounter for antineoplastic chemotherapy: Secondary | ICD-10-CM | POA: Diagnosis not present

## 2019-06-14 DIAGNOSIS — T451X5A Adverse effect of antineoplastic and immunosuppressive drugs, initial encounter: Secondary | ICD-10-CM

## 2019-06-14 DIAGNOSIS — Z95828 Presence of other vascular implants and grafts: Secondary | ICD-10-CM

## 2019-06-14 DIAGNOSIS — D649 Anemia, unspecified: Secondary | ICD-10-CM | POA: Diagnosis not present

## 2019-06-14 LAB — CMP (CANCER CENTER ONLY)
ALT: 11 U/L (ref 0–44)
AST: 26 U/L (ref 15–41)
Albumin: 3.2 g/dL — ABNORMAL LOW (ref 3.5–5.0)
Alkaline Phosphatase: 62 U/L (ref 38–126)
Anion gap: 8 (ref 5–15)
BUN: 20 mg/dL (ref 8–23)
CO2: 27 mmol/L (ref 22–32)
Calcium: 8.7 mg/dL — ABNORMAL LOW (ref 8.9–10.3)
Chloride: 108 mmol/L (ref 98–111)
Creatinine: 1.27 mg/dL — ABNORMAL HIGH (ref 0.44–1.00)
GFR, Est AFR Am: 46 mL/min — ABNORMAL LOW (ref >60)
GFR, Estimated: 40 mL/min — ABNORMAL LOW (ref >60)
Glucose, Bld: 135 mg/dL — ABNORMAL HIGH (ref 70–99)
Potassium: 3.7 mmol/L (ref 3.5–5.1)
Sodium: 143 mmol/L (ref 135–145)
Total Bilirubin: 0.4 mg/dL (ref 0.3–1.2)
Total Protein: 6.6 g/dL (ref 6.5–8.1)

## 2019-06-14 LAB — CBC WITH DIFFERENTIAL (CANCER CENTER ONLY)
Abs Immature Granulocytes: 0.02 10*3/uL (ref 0.00–0.07)
Basophils Absolute: 0 10*3/uL (ref 0.0–0.1)
Basophils Relative: 0 %
Eosinophils Absolute: 0.1 10*3/uL (ref 0.0–0.5)
Eosinophils Relative: 2 %
HCT: 28 % — ABNORMAL LOW (ref 36.0–46.0)
Hemoglobin: 8.8 g/dL — ABNORMAL LOW (ref 12.0–15.0)
Immature Granulocytes: 0 %
Lymphocytes Relative: 28 %
Lymphs Abs: 1.4 10*3/uL (ref 0.7–4.0)
MCH: 30.3 pg (ref 26.0–34.0)
MCHC: 31.4 g/dL (ref 30.0–36.0)
MCV: 96.6 fL (ref 80.0–100.0)
Monocytes Absolute: 1.1 10*3/uL — ABNORMAL HIGH (ref 0.1–1.0)
Monocytes Relative: 23 %
Neutro Abs: 2.4 10*3/uL (ref 1.7–7.7)
Neutrophils Relative %: 47 %
Platelet Count: 152 10*3/uL (ref 150–400)
RBC: 2.9 MIL/uL — ABNORMAL LOW (ref 3.87–5.11)
RDW: 19.6 % — ABNORMAL HIGH (ref 11.5–15.5)
WBC Count: 5 10*3/uL (ref 4.0–10.5)
nRBC: 0 % (ref 0.0–0.2)

## 2019-06-14 LAB — MAGNESIUM: Magnesium: 2 mg/dL (ref 1.7–2.4)

## 2019-06-14 MED ORDER — PROCHLORPERAZINE MALEATE 10 MG PO TABS
ORAL_TABLET | ORAL | Status: AC
  Filled 2019-06-14: qty 1

## 2019-06-14 MED ORDER — HEPARIN SOD (PORK) LOCK FLUSH 100 UNIT/ML IV SOLN
500.0000 [IU] | Freq: Once | INTRAVENOUS | Status: AC | PRN
  Administered 2019-06-14: 500 [IU]
  Filled 2019-06-14: qty 5

## 2019-06-14 MED ORDER — SODIUM CHLORIDE 0.9 % IV SOLN
1000.0000 mg/m2 | Freq: Once | INTRAVENOUS | Status: AC
  Administered 2019-06-14: 1748 mg via INTRAVENOUS
  Filled 2019-06-14: qty 45.97

## 2019-06-14 MED ORDER — SODIUM CHLORIDE 0.9% FLUSH
10.0000 mL | Freq: Once | INTRAVENOUS | Status: AC
  Administered 2019-06-14: 10 mL
  Filled 2019-06-14: qty 10

## 2019-06-14 MED ORDER — SODIUM CHLORIDE 0.9 % IV SOLN
Freq: Once | INTRAVENOUS | Status: AC
  Filled 2019-06-14: qty 250

## 2019-06-14 MED ORDER — DEXTROSE 5 % IV SOLN
Freq: Once | INTRAVENOUS | Status: DC
  Filled 2019-06-14: qty 250

## 2019-06-14 MED ORDER — PROCHLORPERAZINE MALEATE 10 MG PO TABS
10.0000 mg | ORAL_TABLET | Freq: Once | ORAL | Status: AC
  Administered 2019-06-14: 10 mg via ORAL

## 2019-06-14 MED ORDER — DULOXETINE HCL 30 MG PO CPEP: 30.0000 mg | ORAL_CAPSULE | Freq: Every day | ORAL | 0 refills | Status: DC

## 2019-06-14 MED ORDER — SODIUM CHLORIDE 0.9% FLUSH
10.0000 mL | INTRAVENOUS | Status: DC | PRN
  Administered 2019-06-14: 10 mL
  Filled 2019-06-14: qty 10

## 2019-06-14 NOTE — Progress Notes (Signed)
Confirmed with Cira Rue, NP that patient is to receive gemcitabine 1000 mg/m2 today (patient's gemcitabine was dose reduced last cycle secondary to neutropenia).  Leron Croak, PharmD, BCPS PGY2 Hematology/Oncology Pharmacy Resident 06/14/2019 9:28 AM

## 2019-06-14 NOTE — Patient Instructions (Signed)
[facility] °Discharge Instructions for Patients Receiving Chemotherapy ° °Today you received the following chemotherapy agents Gemzar ° °To help prevent nausea and vomiting after your treatment, we encourage you to take your nausea medication as directed. °  °If you develop nausea and vomiting that is not controlled by your nausea medication, call the clinic.  ° °BELOW ARE SYMPTOMS THAT SHOULD BE REPORTED IMMEDIATELY: °· *FEVER GREATER THAN 100.5 F °· *CHILLS WITH OR WITHOUT FEVER °· NAUSEA AND VOMITING THAT IS NOT CONTROLLED WITH YOUR NAUSEA MEDICATION °· *UNUSUAL SHORTNESS OF BREATH °· *UNUSUAL BRUISING OR BLEEDING °· TENDERNESS IN MOUTH AND THROAT WITH OR WITHOUT PRESENCE OF ULCERS °· *URINARY PROBLEMS °· *BOWEL PROBLEMS °· UNUSUAL RASH °Items with * indicate a potential emergency and should be followed up as soon as possible. ° °Feel free to call the clinic should you have any questions or concerns. The clinic phone number is [phone]. ° °Please show the CHEMO ALERT CARD at check-in to the Emergency Department and triage nurse. ° ° °

## 2019-06-14 NOTE — Progress Notes (Signed)
Okay to treat per Cira Rue NP. Gemzar only with compazine as premed. Cisplatin being held due to CIPN.

## 2019-06-15 ENCOUNTER — Telehealth: Payer: Self-pay | Admitting: Gastroenterology

## 2019-06-15 ENCOUNTER — Telehealth: Payer: Self-pay | Admitting: Nurse Practitioner

## 2019-06-15 NOTE — Telephone Encounter (Signed)
Called [facility].  BC/BS Fed. ID: [id number] May: [id number], Donna May, Group: [id number]. Submitted PA via CoverMyMeds for lansoprazole 15 mg once daily. They should respond within 5 days

## 2019-06-15 NOTE — Telephone Encounter (Signed)
Scheduled appt per 3/31 los 

## 2019-06-19 NOTE — Telephone Encounter (Signed)
Prior Auth for lansoprazole (Prevacid) approved from 05-17-19 thru 06-15-2020. BC/BS FEP clincal call center: [phone].

## 2019-06-22 NOTE — Progress Notes (Signed)
[place]   [phone] [phone]   Clinic Follow up Note   Patient Care Team: [name], MD as PCP - General (Family Medicine) [name], [name] Dad, MD as Consulting Physician (Oncology) [name], [name], MD as Consulting Physician (Cardiology) [name], MD as Consulting Physician (Obstetrics and Gynecology) [name], [name], NP as Nurse Practitioner (Hematology and Oncology) [facility] [name], MD as Consulting Physician (Hematology) [name], [name], MD as Consulting Physician (Gastroenterology) Virgina Evener, Dawn, RN (Inactive) as Oncology Nurse Navigator [name], MD as Consulting Physician (General Surgery)  Date of Service:  06/28/2019  CHIEF COMPLAINT: F/u ofcholangiocarcinomaof liver  SUMMARY OF ONCOLOGIC HISTORY: Oncology History  Malignant neoplasm of female breast ([place])  11/06/2018 Genetic Testing   Negative genetic testing on the [employer] Common Hereditary Cancers panel. A variant of uncertain significance was identified in one of her APC genes, called c.1243G>A (p.Ala415Thr).  The Common Hereditary Cancers Panel offered by [facility] includes sequencing and/or deletion duplication testing of the following 48 genes: APC, ATM, AXIN2, BARD1, BMPR1A, BRCA1, BRCA2, BRIP1, CDH1, CDK4, CDKN2A (p14ARF), CDKN2A (p16INK4a), CHEK2, CTNNA1, DICER1, EPCAM (Deletion/duplication testing only), GREM1 (promoter region deletion/duplication testing only), KIT, MEN1, MLH1, MSH2, MSH3, MSH6, MUTYH, NBN, NF1, NHTL1, PALB2, PDGFRA, PMS2, POLD1, POLE, PTEN, RAD50, RAD51C, RAD51D, RNF43, SDHB, SDHC, SDHD, SMAD4, SMARCA4. STK11, TP53, TSC1, TSC2, and VHL.  The following genes were evaluated for sequence changes only: SDHA and HOXB13 c.251G>A variant only.    Intrahepatic cholangiocarcinoma ([place])  09/07/2018 Imaging   CT Chest IMPRESSION: 1. New, enhancing mass involving segment 4 of the liver and fundus of  gallbladder is concerning for malignancy. This may represent either metastatic disease from breast cancer or neoplasm primary to the liver or hepatic biliary tree. Further evaluation with contrast enhanced CT of the abdomen and pelvis is recommended. 2. No findings to suggest metastatic disease within the chest. 3.  Aortic Atherosclerosis (ICD10-I70.0). 4. Coronary artery calcifications.   09/13/2018 Pathology Results   Diagnosis Liver, needle/core biopsy - ADENOCARCINOMA. Microscopic Comment Immunohistochemistry for CK7 is positive. CK20, TTF1, CDX-2, GATA-3, PAX 8, Qualitative ER, p63 and CK5/6 are negative. The provided clinical history of remote mammary carcinoma is noted. Based on the morphology and immunophenotype of the adenocarcinoma observed in this specimen, primary cholangiocarcinoma is favored. Clinical and radiologic correlation are  encouraged. Results reported to [employer] on 09/15/2018. Intradepartmental consultation (Dr. Vic Ripper).   09/13/2018 Initial Diagnosis   Cholangiocarcinoma ([place])   09/23/2018 Procedure   Colonoscopy by Dr. Havery Moros 09/23/18  IMPRESSION - Two 3 to 4 mm polyps in the ascending colon, removed with a cold snare. Resected and retrieved. - Five 3 to 5 mm polyps in the transverse colon, removed with a cold snare. Resected and retrieved. - One 5 mm polyp at the splenic flexure, removed with a cold snare. Resected and retrieved. - Three 3 to 5 mm polyps in the sigmoid colon, removed with a cold snare. Resected and retrieved. - The examination was otherwise normal. Upper Endopscy by Dr. Havery Moros 09/23/18  IMPRESSION - Esophagogastric landmarks identified. - 2 cm hiatal hernia. - Normal esophagus otherwise. - A single gastric polyp. Resected and retrieved. - Mild gastritis. Biopsied. - Normal duodenal bulb and second portion of the duodenum.   09/23/2018 Pathology Results   Diagnosis 09/23/18 1. Surgical [P], duodenum - BENIGN SMALL BOWEL  MUCOSA. - NO ACTIVE INFLAMMATION OR VILLOUS ATROPHY IDENTIFIED. 2. Surgical [P], stomach, polyp - HYPERPLASTIC POLYP(S). - THERE IS NO EVIDENCE OF  MALIGNANCY. 3. Surgical [P], gastric antrum and gastric body - CHRONIC INACTIVE GASTRITIS. - THERE IS NO EVIDENCE OF HELICOBACTER-PYLORI, DYSPLASIA, OR MALIGNANCY. - SEE COMMENT. 4. Surgical [P], colon, sigmoid, splenic flexure, transverse and ascending, polyp (9) - TUBULAR ADENOMA(S). - SESSILE SERRATED POLYP WITHOUT CYTOLOGIC DYSPLASIA. - HIGH GRADE DYSPLASIA IS NOT IDENTIFIED. 5. Surgical [P], colon, sigmoid, polyp (2) - HYPERPLASTIC POLYP(S). - THERE IS NO EVIDENCE OF MALIGNANCY.   09/23/2018 Cancer Staging   Staging form: Intrahepatic Bile Duct, AJCC 8th Edition - Clinical stage from 09/23/2018: Stage IB (cT1b, cN0, cM0) - Signed by [name], MD on 10/06/2018   09/29/2018 PET scan   PET 09/29/18 IMPRESSION: 1. Hypermetabolic mass in the RIGHT hepatic lobe consistent with biopsy proven adenocarcinoma. No additional liver metastasis. 2. No evidence of local breast cancer recurrence in the RIGHT breast or RIGHT axilla. 3. Mild bilateral hypermetabolic adrenal glands is favored benign hyperplasia. 4. No evidence of additional metastatic disease on skull base to thigh FDG PET scan.   11/06/2018 Genetic Testing   Negative genetic testing on the [facility] Common Hereditary Cancers panel. A variant of uncertain significance was identified in one of her APC genes, called c.1243G>A (p.Ala415Thr).  The Common Hereditary Cancers Panel offered by [facility] includes sequencing and/or deletion duplication testing of the following 48 genes: APC, ATM, AXIN2, BARD1, BMPR1A, BRCA1, BRCA2, BRIP1, CDH1, CDK4, CDKN2A (p14ARF), CDKN2A (p16INK4a), CHEK2, CTNNA1, DICER1, EPCAM (Deletion/duplication testing only), GREM1 (promoter region deletion/duplication testing only), KIT, MEN1, MLH1, MSH2, MSH3, MSH6, MUTYH, NBN, NF1, NHTL1, PALB2, PDGFRA, PMS2, POLD1, POLE,  PTEN, RAD50, RAD51C, RAD51D, RNF43, SDHB, SDHC, SDHD, SMAD4, SMARCA4. STK11, TP53, TSC1, TSC2, and VHL.  The following genes were evaluated for sequence changes only: SDHA and HOXB13 c.251G>A variant only.    11/17/2018 Pathology Results   Diagnosis 11/17/18 1. Soft tissue, biopsy, Diaphragmatic nodules - METASTATIC ADENOCARCINOMA, CONSISTENT WITH PATIENT'S CLINICAL HISTORY OF CHOLANGIOCARCINOMA. SEE NOTE 2. Liver, biopsy, Left - LIVER PARENCHYMA WITH A BENIGN FIBROTIC NODULE - NO EVIDENCE OF MALIGNANCY 3. Stomach, biopsy - BENIGN PAPILLARY MESOTHELIAL HYPERPLASIA - NO EVIDENCE OF MALIGNANCY   11/29/2018 Imaging   CT CAP WO Contrast  IMPRESSION: 1. Dominant liver mass appears grossly stable from 09/29/2018. Additional liver lesions are too small to characterize but were not shown to be hypermetabolic on PET. 2. Mild nodularity of both adrenal glands with associated hypermetabolism on 09/29/2018. Continued attention on follow-up exams is warranted. 3. Small right lower lobe nodules, stable from 09/07/2018. Again, attention on follow-up is recommended. 4. Trace bilateral pleural fluid. 5. Aortic atherosclerosis (ICD10-170.0). Coronary artery calcification. 6. Enlarged pulmonic trunk, indicative of pulmonary arterial hypertension.     11/30/2018 - 06/14/2019 Chemotherapy   First line chemo Oxaliplatin and gemcitabine q2weeks starting 11/30/18. Stopped Oxaliplatin on 05/03/19 due to worsening neuropathy. Added Cisplatin with C13 on 05/17/19 and stopped on C15 06/14/19 due to neuropathy. Reduced to maintenance single agent Gemcitabine on 06/28/19.    01/20/2019 Imaging   CT CAP IMPRESSION: restaging  1. Mild interval increase in size of dominant liver mass involving segment 4 and segment 5. 2. No significant or progressive adrenal nodularity identified to suggest metastatic disease. 3. Unchanged appearance of small right lower lobe and lingular lung nodules, nonspecific.   03/21/2019 PET scan     IMPRESSION: 1. Large right hepatic mass with SUV uptake near background hepatic activity, dramatic response to therapy, also with decrease in size when compared to the prior study. 2. Signs of prior right mastectomy and axillary dissection as  before. 3. Left adrenal activity in no longer above the level of activity seen in the contralateral, right adrenal gland or in the liver. Attention on follow-up. 4. No new signs of disease.   06/27/2019 PET scan   IMPRESSION: 1. Further decrease in size of a dominant hepatic mass which is non FDG avid. 2. No evidence of metastatic disease. 3. No evidence of left adrenal hypermetabolism or mass. 4. Incidental findings, including: Uterine fibroids. Coronary artery atherosclerosis. Aortic Atherosclerosis (ICD10-I70.0). Pulmonary artery enlargement suggests pulmonary arterial hypertension.   06/28/2019 -  Chemotherapy   Maintenance single agent Gemcitabine q2weeks  starting on 06/28/19 with C16.       CURRENT THERAPY:  First line chemoOxaliplatinand gemcitabine q2weeksstarting 11/30/18.Stopped Oxaliplatin on 05/03/19 due to worsening neuropathy.AddedCisplatin with C13 on 05/17/19 and stopped on C15 06/14/19 due to neuropathy. Reduced to maintenance single agent Gemcitabine q2weeks starting on 06/28/19 with C16.    INTERVAL HISTORY:  ALARIA OCONNOR is here for a follow up of treatment. She presents to the clinic alone. She notes she has more neuropathy with mild instability with walking and dropping things. She has mostly numbness and some tingling of feet and her fingers. She has been using cane or walker when walking outside of home. She notes she has Gabapentin '100mg'$  which she takes sometimes '300mg'$  daily. She notes she went to neuropathy seminar and is considering PT for this. She notes she is only driving short distances for now. She notes she has had diarrhea for the last few days and has miralax to take.     REVIEW OF SYSTEMS:    Constitutional: Denies fevers, chills or abnormal weight loss Eyes: Denies blurriness of vision Ears, nose, mouth, throat, and face: Denies mucositis or sore throat Respiratory: Denies cough, dyspnea or wheezes Cardiovascular: Denies palpitation, chest discomfort or lower extremity swelling Gastrointestinal:  Denies nausea, heartburn (+) Diarrhea Skin: Denies abnormal skin rashes Lymphatics: Denies new lymphadenopathy or easy bruising Neurological: (+) Mainly numbness and mild tingling.  Behavioral/Psych: Mood is stable, no new changes  All other systems were reviewed with the patient and are negative.  MEDICAL HISTORY:  Past Medical History:  Diagnosis Date  . Anemia   . Anxiety   . Arthritis   . Breast cancer ([place]) 1992  . Cataract    Bilateral eyes - surgery to remove  . Chronic kidney disease    CKD stage 3  . Complication of anesthesia    "something they use make me itch for a couple of days."  . Depression   . Diabetes mellitus    type 2  . Duodenitis 01/18/2002  . Fainting spell   . Family history of breast cancer   . Family history of prostate cancer   . GERD (gastroesophageal reflux disease)   . Heart murmur    never has caused any problems  . Hiatal hernia 08/08/2008, 01/18/2002  . History of pneumonia    x 2  . Hyperlipidemia   . Hypertension   . Liver cancer ([place]) 08/2018  . Lymphedema 2017   Right arm  . Pneumonia    x 2  . UTI (lower urinary tract infection)     SURGICAL HISTORY: Past Surgical History:  Procedure Laterality Date  . AXILLARY SURGERY     cyst removal, right  . COLONOSCOPY  09/23/2018   Dr. Havery Moros - polyps  . EYE SURGERY Bilateral    cataracts to remove  . LAPAROSCOPY N/A 11/17/2018   Procedure: LAPAROSCOPY DIAGNOSTIC, INTRAOPERATIVE ULTRASOUND,  PERITONEAL BIOPSIES;  Surgeon: [name], MD;  Location: [facility];  Service: General;  Laterality: N/A;  GENERAL AND EPIDURAL  . LIVER BIOPSY  08/2018   Dr. Lindwood Coke  . MASTECTOMY  1992    right, with flap  . NM MYOCAR PERF WALL MOTION  06/11/2009   Protocol:[place], post stress EF58%, EKG negative for ischemia, low risk  . PORTACATH PLACEMENT N/A 12/02/2018   Procedure: INSERTION PORT-A-CATH;  Surgeon: [name], MD;  Location: [facility];  Service: General;  Laterality: N/A;  . RECONSTRUCTION BREAST W/ TRAM FLAP Right   . TONSILLECTOMY    . TRANSTHORACIC ECHOCARDIOGRAM  12/24/2009   LVEF =>55%, normal study  . UPPER GI ENDOSCOPY      I have reviewed the social history and family history with the patient and they are unchanged from previous note.  ALLERGIES:  has No Known Allergies.  MEDICATIONS:  Current Outpatient Medications  Medication Sig Dispense Refill  . amLODipine (NORVASC) 10 MG tablet TAKE 1 TABLET BY MOUTH EVERY DAY 90 tablet 3  . aspirin 81 MG tablet Take 81 mg by mouth at bedtime.     . Calcium Carbonate-Vitamin D 600-200 MG-UNIT TABS Take 1 tablet by mouth daily.    Marland Kitchen CALCIUM-MAGNESIUM-VITAMIN D PO Take 1 tablet by mouth once a week.     . Cholecalciferol (VITAMIN D3) 2000 UNITS TABS Take 1 tablet by mouth once a week.     . DULoxetine (CYMBALTA) 30 MG capsule Take 1 capsule (30 mg total) by mouth daily. 30 capsule 0  . Echinacea-Goldenseal (ECHINACEA COMB/GOLDEN SEAL) CAPS Take by mouth.    . gabapentin (NEURONTIN) 100 MG capsule Take 1 capsule (100 mg total) by mouth at bedtime. (Patient taking differently: Take 300 mg by mouth at bedtime. ) 90 capsule 1  . glucose blood test strip 1 each by Other route 2 (two) times daily. Use Onetouch verio test strips as instructed to check blood sugar twice daily. 100 each 2  . hydrochlorothiazide (HYDRODIURIL) 25 MG tablet Take 1 tablet (25 mg total) by mouth daily. 90 tablet 3  . HYDROcodone-acetaminophen (NORCO/VICODIN) 5-325 MG tablet Take 1 tablet by mouth every 6 (six) hours as needed for moderate pain. 10 tablet 0  . ketoconazole (NIZORAL) 2 % cream Apply 1 fingertip amount to each foot daily. 30 g 0  .  lansoprazole (PREVACID) 15 MG capsule Take 1 capsule (15 mg total) by mouth daily. 90 capsule 1  . lidocaine-prilocaine (EMLA) cream Apply to affected area once 90 g 1  . loratadine (CLARITIN) 10 MG tablet Take 10 mg by mouth daily as needed for allergies.    . metFORMIN (GLUCOPHAGE-XR) 500 MG 24 hr tablet Take 1 tablet (500 mg total) by mouth 2 (two) times daily before a meal. 180 tablet 1  . metoprolol succinate (TOPROL-XL) 100 MG 24 hr tablet Take 1 tablet (100 mg total) by mouth daily. Take with or immediately following a meal. 90 tablet 1  . Omega 3 1000 MG CAPS Take 2,000 mg by mouth daily.     . ondansetron (ZOFRAN) 8 MG tablet Take 1 tablet (8 mg total) by mouth every 8 (eight) hours as needed for nausea or vomiting. (Patient not taking: Reported on 05/31/2019) 30 tablet 0  . Prenatal Vit-Fe Fumarate-FA (MULTIVITAMIN-PRENATAL) 27-0.8 MG TABS tablet Take 1 tablet by mouth daily at 12 noon.    . prochlorperazine (COMPAZINE) 10 MG tablet Take 1 tablet (10 mg total) by mouth every 6 (six) hours as needed  for nausea or vomiting. (Patient not taking: Reported on 05/31/2019) 30 tablet 0  . raloxifene (EVISTA) 60 MG tablet TAKE 1 TABLET DAILY 90 tablet 0  . rosuvastatin (CRESTOR) 20 MG tablet Take 1 tablet (20 mg total) by mouth daily. 90 tablet 1  . sitaGLIPtin (JANUVIA) 100 MG tablet Take 1 tablet (100 mg total) by mouth daily. 90 tablet 4  . triamcinolone cream (KENALOG) 0.1 % Apply 1 application topically 2 (two) times daily. (Patient not taking: Reported on 05/31/2019) 30 g 0  . valsartan (DIOVAN) 320 MG tablet Take 1 tablet (320 mg total) by mouth daily. 90 tablet 3   Current Facility-Administered Medications  Medication Dose Route Frequency Provider Last Rate Last Admin  . triamcinolone acetonide (KENALOG) 10 MG/ML injection 10 mg  10 mg Other Once Harriet Masson, DPM       Facility-Administered Medications Ordered in Other Visits  Medication Dose Route Frequency Provider Last Rate Last  Admin  . dextrose 5 % solution   Intravenous Once [name], MD      . gemcitabine [facility]) 1,748 mg in sodium chloride 0.9 % 250 mL chemo infusion  1,000 mg/m2 (Treatment Plan Recorded) Intravenous Once [name], MD      . heparin lock flush 100 unit/mL  500 Units Intracatheter Once PRN [name], MD      . sodium chloride flush (NS) 0.9 % injection 10 mL  10 mL Intracatheter PRN [name], MD        PHYSICAL EXAMINATION: ECOG PERFORMANCE STATUS: 2 - Symptomatic, <50% confined to bed  Vitals:   06/28/19 0816  BP: (!) 181/80  Pulse: 72  Resp: 18  Temp: 98 F (36.7 C)  SpO2: 100%   Filed Weights   06/28/19 0816  Weight: 136 lb 6.4 oz (61.9 kg)    Due to COVID19 we will limit examination to appearance. Patient had no complaints.  GENERAL:alert, no distress and comfortable SKIN: skin color normal, no rashes or significant lesions EYES: normal, Conjunctiva are pink and non-injected, sclera clear  NEURO: alert & oriented x 3 with fluent speech   LABORATORY DATA:  I have reviewed the data as listed CBC Latest Ref Rng & Units 06/28/2019 06/14/2019 05/31/2019  WBC 4.0 - 10.5 K/uL 6.5 5.0 5.9  Hemoglobin 12.0 - 15.0 g/dL 8.6(L) 8.8(L) 9.8(L)  Hematocrit 36.0 - 46.0 % 27.3(L) 28.0(L) 30.6(L)  Platelets 150 - 400 K/uL 189 152 124(L)     CMP Latest Ref Rng & Units 06/28/2019 06/14/2019 05/31/2019  Glucose 70 - 99 mg/dL 87 135(H) 200(H)  BUN 8 - 23 mg/dL '18 20 16  '$ Creatinine 0.44 - 1.00 mg/dL 1.22(H) 1.27(H) 1.07(H)  Sodium 135 - 145 mmol/L 141 143 140  Potassium 3.5 - 5.1 mmol/L 3.8 3.7 3.9  Chloride 98 - 111 mmol/L 108 108 108  CO2 22 - 32 mmol/L '23 27 25  '$ Calcium 8.9 - 10.3 mg/dL 8.8(L) 8.7(L) 8.5(L)  Total Protein 6.5 - 8.1 g/dL 7.0 6.6 6.4(L)  Total Bilirubin 0.3 - 1.2 mg/dL 0.5 0.4 0.4  Alkaline Phos 38 - 126 U/L 54 62 61  AST 15 - 41 U/L 32 26 31  ALT 0 - 44 U/L '11 11 10      '$ RADIOGRAPHIC STUDIES: I have personally reviewed the radiological images as listed and agreed  with the findings in the report. NM PET Image Restag (PS) Skull Base To Thigh  Result Date: 06/27/2019 CLINICAL DATA:  Subsequent treatment strategy for intrahepatic cholangiocarcinoma. Restaging. COVID-19 vaccine  in left arm 06/09/2019. EXAM: NUCLEAR MEDICINE PET SKULL BASE TO THIGH TECHNIQUE: 7.2 mCi F-18 FDG was injected intravenously. Full-ring PET imaging was performed from the skull base to thigh after the radiotracer. CT data was obtained and used for attenuation correction and anatomic localization. Fasting blood glucose: 89 mg/dl COMPARISON:  03/21/2019 and the CT of 01/20/2019 FINDINGS: Mediastinal blood pool activity: SUV max 2.4 Liver activity: SUV max NA NECK: Presumably physiologic laryngeal hypermetabolism. Incidental CT findings: Bilateral carotid atherosclerosis. No cervical adenopathy. CHEST: No pulmonary parenchymal or thoracic nodal hypermetabolism. Incidental CT findings: Left Port-A-Cath tip high right atrium. Mild cardiomegaly with left main coronary artery atherosclerosis. Pulmonary artery enlargement, outflow tract 3.1 cm. Right axillary node dissection and mastectomy. No axillary adenopathy. Lateral right lower lobe 6 mm nodule is felt to be similar on 47/8. ABDOMEN/PELVIS: No hypermetabolism to correspond to the mass centered in segments 4B and 5. Metabolism similar to the surrounding liver. On the order of a S.U.V. max of 3.3, as before. Measures maximally 4.7 x 2.8 cm on 99/4 versus 5.5 x 3.7 cm at the same level on the prior exam (when remeasured). No abdominopelvic nodal hypermetabolism. No adrenal hypermetabolism. Incidental CT findings: Hepatic cysts. Bilateral low-density renal lesions which are likely cysts. Abdominal aortic atherosclerosis. Bilateral adrenal thickening without well-defined dominant mass. Calcified uterine fibroids. Right ovarian dermoid, maximally 3.1 cm. Pelvic floor laxity. SKELETON: No abnormal marrow activity. Incidental CT findings: Degenerative changes of  both hips. IMPRESSION: 1. Further decrease in size of a dominant hepatic mass which is non FDG avid. 2. No evidence of metastatic disease. 3. No evidence of left adrenal hypermetabolism or mass. 4. Incidental findings, including: Uterine fibroids. Coronary artery atherosclerosis. Aortic Atherosclerosis (ICD10-I70.0). Pulmonary artery enlargement suggests pulmonary arterial hypertension. Electronically Signed   By: Abigail Miyamoto M.D.   On: 06/27/2019 13:51     ASSESSMENT & PLAN:  LIYLA RADLIFF is a 79 y.o. female with    1.Intrahepatic cholangiocarcinoma, cT1N0M1,with peritoneal metastasis, MSS, IDH1 mutation (+) -She was diagnosed in 08/2018. Her biopsy of her liver mass shows adenocarcinoma,most consistent withcholangiocarcinoma.Her EGD/Colonoscopy from 7/10/20was negativeand 09/29/18 PET scanshowedno evidence of distant metastasis. -She was brought to OR on 9/3,unfortunately she was found to have peritoneal metastasis to right diaphragm and surgery was aborted. -Given her metastatic cancer and ineligibility forsurgery, Istarted her onsystemicchemo withFirst line chemoOxaliplatinand gemcitabine q2weeksto control her disease.She started on 11/30/18.Stopped Oxaliplatin on 05/03/19 due to worsening neuropathy.AddedCisplatin with C13 on 05/17/19. -S/p C15 she has more neuropathy with mostly numbness and tingling of feet and fingers. She is otherwise tolerating treatment. I have held Cisplatin since 06/14/19.  -I personally reviewed and discussed her PET scan from 06/27/19 which showed further decrease in size of liver mass with low tumor activity. No evidence of metastatic disease or left adrenal hypermetabolism or mass. Overall she has been responding to treatment well.  -Given her good response to treatment and progressing neuropathy, it is fine to reduce her chemo to maintenance single agent Gemcitabine q2weeks now.  -Labs reviewed, Hg 8.6, Cr 1.22, Ca 8.8, overall adequate to proceed with  C16 single agent Gemcitabine today.  -F/u in 2 weeks   2. Anemia -Secondary to chemo  -Ipreviouslyrecommendedshe start prenatal vitamin. -With recent worsened anemia she had blood transfusion weak of 03/29/19.  -Moderate and stable.   3. H/o right breast cancer, Geneticsnegative -s/p mastectomy in 1992, per patient did not require adjuvant therapy  -followed by Dr. Jana Hakim -Her genetic testing wasnegativefor pathogeneticmutations. She did have  VUS of gene APC.  4.Comorbidities:DM, HTN, hiatal hernia, GERD, renal artery stenosis -f/u with PCP -We will monitor her blood pressure and glucose level closely during her chemo. We will adjust her meds as needed -Given she is on steroids I encouraged her to watch her BG at home daily and to reduce sugar intake the week of chemo.She did not tolerate Glucerna, she will continue low sugar protein shakes.  5. Family support  -she has a son and daughterwho are often busy but help as needed.  -She notes they are aware of her condition. I will update her children today.  6.Goal of care discussion  -The patient understands the goal of care is palliative. -she is full code now  7.Hypertension, uncontrolled -She is on amlodipine and HTCZ, metoprolol and losartan. I encouraged her to make sure she takes daily and f/u with PCP for possible dose adjustment given recent elevated BP in clinic.   8. Neuropathy, G2 -secondary to Oxaliplatin  -IpreviouslyreducedOxaliplatin dose 2-3 times since C9.Given continued progression of neuropathy in feet and numbness of fingers I d/c Oxaliplatin and switched to Cisplatin with C13.  -S/p C15 she has more neuropathy, mostly with numbness. She is dropping more and mild instability. -She is currently on '100mg'$  Gabapentin sometimes TID. I recommend she take '200mg'$  nightly and 1-2 tabs during the day if not too drowsy. She continue Cymbalta and prenatal vitamin daily.  -Will d/c Cisplatin  since 06/14/19. She will continue single agent Gemcitabine. -I recommend neuro rehab, she agrees.  I will refer her (06/28/19)  9. Taste change, weight loss  -With recent taste change and loss of taste she has lost 5-6 pounds in 2 weeks.  -She does not try to add more salt or sugar in food given her DM and HTN.  -I discussed if she is eating less she should take more nutritional supplements daily such as Glucerna 2 bottles a day.  -She has mild weight loss due to recent diarrhea. I recommended she take Miralax and increase water intake.  -She will continue to f/u with Dietician. She will see her today.    10. Elevated Cr  -I strongly encouraged her to drink more water at least 40 ounces daily to maintain kidney function.  -Was improving but has increased recently. Cr 1.22 today (06/28/19).    PLAN: -Increase Gabapentin to '200mg'$  nightly and 1-2 tabs during the day.  -Labs reviewed and adequate to proceed with C16 single agent Gemcitabine today, will cancel cisplatin due to her neuropathy and elevated Cr -neuro PT referral for neuropathy  -Lab, flush, f/uandGemcitabine in 2and 4 weeks    No problem-specific Assessment & Plan notes found for this encounter.   Orders Placed This Encounter  Procedures  . Ambulatory referral to Physical Therapy    Referral Priority:   Routine    Referral Type:   Physical Medicine    Referral Reason:   Specialty Services Required    Requested Specialty:   Physical Therapy    Number of Visits Requested:   1  . PT eval and treat    Standing Status:   Future    Standing Expiration Date:   06/27/2020   All questions were answered. The patient knows to call the clinic with any problems, questions or concerns. No barriers to learning was detected. The total time spent in the appointment was 30 minutes.     [name], MD 06/28/2019   I, Joslyn Devon, am acting as scribe for [name], MD.  I have reviewed the above documentation for accuracy and  completeness, and I agree with the above.

## 2019-06-22 NOTE — Progress Notes (Signed)
Pharmacist Chemotherapy Monitoring - Follow Up Assessment    I verify that I have reviewed each item in the below checklist:  . Regimen for the patient is scheduled for the appropriate day and plan matches scheduled date. Marland Kitchen Appropriate non-routine labs are ordered dependent on drug ordered. . If applicable, additional medications reviewed and ordered per protocol based on lifetime cumulative doses and/or treatment regimen.   Plan for follow-up and/or issues identified: No . I-vent associated with next due treatment: No . MD and/or nursing notified: No  Elliff,Anjolina Byrer D 06/22/2019 11:35 AM

## 2019-06-27 ENCOUNTER — Other Ambulatory Visit: Payer: Self-pay

## 2019-06-27 ENCOUNTER — Ambulatory Visit (HOSPITAL_COMMUNITY)
Admission: RE | Admit: 2019-06-27 | Discharge: 2019-06-27 | Disposition: A | Discharge status: Home / Self Care | Payer: Medicare Other | Source: Ambulatory Visit | Attending: Nurse Practitioner | Admitting: Nurse Practitioner

## 2019-06-27 DIAGNOSIS — I2510 Atherosclerotic heart disease of native coronary artery without angina pectoris: Secondary | ICD-10-CM | POA: Diagnosis not present

## 2019-06-27 DIAGNOSIS — C221 Intrahepatic bile duct carcinoma: Secondary | ICD-10-CM

## 2019-06-27 DIAGNOSIS — I700 Atherosclerosis of aorta: Secondary | ICD-10-CM | POA: Diagnosis not present

## 2019-06-27 DIAGNOSIS — D259 Leiomyoma of uterus, unspecified: Secondary | ICD-10-CM | POA: Diagnosis not present

## 2019-06-27 LAB — GLUCOSE, CAPILLARY: Glucose-Capillary: 89 mg/dL (ref 70–99)

## 2019-06-27 MED ORDER — FLUDEOXYGLUCOSE F - 18 (FDG) INJECTION
7.2000 | Freq: Once | INTRAVENOUS | Status: AC | PRN
  Administered 2019-06-27: 7.2 via INTRAVENOUS

## 2019-06-28 ENCOUNTER — Inpatient Hospital Stay: Payer: Medicare Other | Admitting: Nutrition

## 2019-06-28 ENCOUNTER — Inpatient Hospital Stay: Payer: Medicare Other

## 2019-06-28 ENCOUNTER — Telehealth: Payer: Self-pay | Admitting: Hematology

## 2019-06-28 ENCOUNTER — Other Ambulatory Visit: Payer: Self-pay

## 2019-06-28 ENCOUNTER — Inpatient Hospital Stay (HOSPITAL_BASED_OUTPATIENT_CLINIC_OR_DEPARTMENT_OTHER): Payer: Medicare Other | Admitting: Hematology

## 2019-06-28 ENCOUNTER — Encounter: Payer: Self-pay | Admitting: Hematology

## 2019-06-28 ENCOUNTER — Inpatient Hospital Stay: Payer: Medicare Other | Attending: Adult Health

## 2019-06-28 VITALS — BP 181/80 | HR 72 | Temp 98.0°F | Resp 18 | Ht 65.0 in | Wt 136.4 lb

## 2019-06-28 DIAGNOSIS — Z95828 Presence of other vascular implants and grafts: Secondary | ICD-10-CM

## 2019-06-28 DIAGNOSIS — C221 Intrahepatic bile duct carcinoma: Secondary | ICD-10-CM | POA: Diagnosis not present

## 2019-06-28 DIAGNOSIS — D6481 Anemia due to antineoplastic chemotherapy: Secondary | ICD-10-CM | POA: Diagnosis not present

## 2019-06-28 DIAGNOSIS — Z5111 Encounter for antineoplastic chemotherapy: Secondary | ICD-10-CM | POA: Insufficient documentation

## 2019-06-28 DIAGNOSIS — C786 Secondary malignant neoplasm of retroperitoneum and peritoneum: Secondary | ICD-10-CM | POA: Insufficient documentation

## 2019-06-28 LAB — CMP (CANCER CENTER ONLY)
ALT: 11 U/L (ref 0–44)
AST: 32 U/L (ref 15–41)
Albumin: 3.5 g/dL (ref 3.5–5.0)
Alkaline Phosphatase: 54 U/L (ref 38–126)
Anion gap: 10 (ref 5–15)
BUN: 18 mg/dL (ref 8–23)
CO2: 23 mmol/L (ref 22–32)
Calcium: 8.8 mg/dL — ABNORMAL LOW (ref 8.9–10.3)
Chloride: 108 mmol/L (ref 98–111)
Creatinine: 1.22 mg/dL — ABNORMAL HIGH (ref 0.44–1.00)
GFR, Est AFR Am: 48 mL/min — ABNORMAL LOW (ref >60)
GFR, Estimated: 42 mL/min — ABNORMAL LOW (ref >60)
Glucose, Bld: 87 mg/dL (ref 70–99)
Potassium: 3.8 mmol/L (ref 3.5–5.1)
Sodium: 141 mmol/L (ref 135–145)
Total Bilirubin: 0.5 mg/dL (ref 0.3–1.2)
Total Protein: 7 g/dL (ref 6.5–8.1)

## 2019-06-28 LAB — CBC WITH DIFFERENTIAL (CANCER CENTER ONLY)
Abs Immature Granulocytes: 0.02 10*3/uL (ref 0.00–0.07)
Basophils Absolute: 0 10*3/uL (ref 0.0–0.1)
Basophils Relative: 0 %
Eosinophils Absolute: 0.1 10*3/uL (ref 0.0–0.5)
Eosinophils Relative: 1 %
HCT: 27.3 % — ABNORMAL LOW (ref 36.0–46.0)
Hemoglobin: 8.6 g/dL — ABNORMAL LOW (ref 12.0–15.0)
Immature Granulocytes: 0 %
Lymphocytes Relative: 20 %
Lymphs Abs: 1.3 10*3/uL (ref 0.7–4.0)
MCH: 30.6 pg (ref 26.0–34.0)
MCHC: 31.5 g/dL (ref 30.0–36.0)
MCV: 97.2 fL (ref 80.0–100.0)
Monocytes Absolute: 1.2 10*3/uL — ABNORMAL HIGH (ref 0.1–1.0)
Monocytes Relative: 19 %
Neutro Abs: 3.9 10*3/uL (ref 1.7–7.7)
Neutrophils Relative %: 60 %
Platelet Count: 189 10*3/uL (ref 150–400)
RBC: 2.81 MIL/uL — ABNORMAL LOW (ref 3.87–5.11)
RDW: 20.4 % — ABNORMAL HIGH (ref 11.5–15.5)
WBC Count: 6.5 10*3/uL (ref 4.0–10.5)
nRBC: 0 % (ref 0.0–0.2)

## 2019-06-28 LAB — MAGNESIUM: Magnesium: 2 mg/dL (ref 1.7–2.4)

## 2019-06-28 MED ORDER — PROCHLORPERAZINE MALEATE 10 MG PO TABS
ORAL_TABLET | ORAL | Status: AC
  Filled 2019-06-28: qty 1

## 2019-06-28 MED ORDER — SODIUM CHLORIDE 0.9% FLUSH
10.0000 mL | INTRAVENOUS | Status: DC | PRN
  Administered 2019-06-28: 10 mL
  Filled 2019-06-28: qty 10

## 2019-06-28 MED ORDER — SODIUM CHLORIDE 0.9% FLUSH
10.0000 mL | Freq: Once | INTRAVENOUS | Status: AC
  Administered 2019-06-28: 10 mL
  Filled 2019-06-28: qty 10

## 2019-06-28 MED ORDER — HEPARIN SOD (PORK) LOCK FLUSH 100 UNIT/ML IV SOLN
500.0000 [IU] | Freq: Once | INTRAVENOUS | Status: AC | PRN
  Administered 2019-06-28: 500 [IU]
  Filled 2019-06-28: qty 5

## 2019-06-28 MED ORDER — PROCHLORPERAZINE MALEATE 10 MG PO TABS
10.0000 mg | ORAL_TABLET | Freq: Once | ORAL | Status: AC
  Administered 2019-06-28: 10 mg via ORAL

## 2019-06-28 MED ORDER — SODIUM CHLORIDE 0.9 % IV SOLN
Freq: Once | INTRAVENOUS | Status: AC
  Filled 2019-06-28: qty 250

## 2019-06-28 MED ORDER — SODIUM CHLORIDE 0.9 % IV SOLN
1000.0000 mg/m2 | Freq: Once | INTRAVENOUS | Status: AC
  Administered 2019-06-28: 1748 mg via INTRAVENOUS
  Filled 2019-06-28: qty 45.97

## 2019-06-28 MED ORDER — DEXTROSE 5 % IV SOLN
Freq: Once | INTRAVENOUS | Status: DC
  Filled 2019-06-28: qty 250

## 2019-06-28 NOTE — Telephone Encounter (Signed)
Adjusted appts per 4/14 los.

## 2019-06-28 NOTE — Progress Notes (Signed)
Nutrition follow-up completed with patient during infusion for metastatic cholangiocarcinoma. Weight was decreased and documented as 136.4 pounds on April 14. This is down from 142.1 pounds March 17. Noted labs: Creatinine 1.22 glucose 87. Patient is now receiving gemcitabine every 2 weeks for maintenance therapy. Patient reports she has had diarrhea in the past few days however this is improving. She does have taste alterations.  Nutrition diagnosis: Inadequate oral intake continues.  Intervention: Due to continuing weight loss and adequate glycemic control, recommend patient switch from Glucerna to Ensure Enlive to provide 350 cal and 20 g protein per bottle. Provided samples.  Encourage patient to drink slowly. Brief education provided on strategies for improving diarrhea. Encourage patient to continue strategies for taste alterations. Questions were answered.  Teach back method used.  Monitoring, evaluation, goals: Patient will tolerate increased calories and protein to minimize further weight loss.  Next visit: Wednesday, April 28 during infusion.  **Disclaimer: This note was dictated with voice recognition software. Similar sounding words can inadvertently be transcribed and this note may contain transcription errors which may not have been corrected upon publication of note.**

## 2019-06-28 NOTE — Patient Instructions (Signed)
[facility] °Discharge Instructions for Patients Receiving Chemotherapy ° °Today you received the following chemotherapy agents Gemzar ° °To help prevent nausea and vomiting after your treatment, we encourage you to take your nausea medication as directed. °  °If you develop nausea and vomiting that is not controlled by your nausea medication, call the clinic.  ° °BELOW ARE SYMPTOMS THAT SHOULD BE REPORTED IMMEDIATELY: °· *FEVER GREATER THAN 100.5 F °· *CHILLS WITH OR WITHOUT FEVER °· NAUSEA AND VOMITING THAT IS NOT CONTROLLED WITH YOUR NAUSEA MEDICATION °· *UNUSUAL SHORTNESS OF BREATH °· *UNUSUAL BRUISING OR BLEEDING °· TENDERNESS IN MOUTH AND THROAT WITH OR WITHOUT PRESENCE OF ULCERS °· *URINARY PROBLEMS °· *BOWEL PROBLEMS °· UNUSUAL RASH °Items with * indicate a potential emergency and should be followed up as soon as possible. ° °Feel free to call the clinic should you have any questions or concerns. The clinic phone number is [phone]. ° °Please show the CHEMO ALERT CARD at check-in to the Emergency Department and triage nurse. ° ° °

## 2019-07-04 DIAGNOSIS — G629 Polyneuropathy, unspecified: Secondary | ICD-10-CM | POA: Diagnosis not present

## 2019-07-04 DIAGNOSIS — G5762 Lesion of plantar nerve, left lower limb: Secondary | ICD-10-CM | POA: Diagnosis not present

## 2019-07-04 DIAGNOSIS — G5612 Other lesions of median nerve, left upper limb: Secondary | ICD-10-CM | POA: Diagnosis not present

## 2019-07-04 DIAGNOSIS — G5611 Other lesions of median nerve, right upper limb: Secondary | ICD-10-CM | POA: Diagnosis not present

## 2019-07-04 DIAGNOSIS — G5761 Lesion of plantar nerve, right lower limb: Secondary | ICD-10-CM | POA: Diagnosis not present

## 2019-07-07 NOTE — Progress Notes (Signed)
[place]   [phone] Fax:([phone]) [phone]   Clinic Follow up Note   Patient Care Team: [name], MD as PCP - General (Family Medicine) [name], [name] Dad, MD as Consulting Physician (Oncology) [name], [name], MD as Consulting Physician (Cardiology) [name], MD as Consulting Physician (Obstetrics and Gynecology) [name], [name], NP as Nurse Practitioner (Hematology and Oncology) [facility] [name], MD as Consulting Physician (Hematology) [name], [name], MD as Consulting Physician (Gastroenterology) Virgina Evener, Dawn, RN (Inactive) as Oncology Nurse Navigator [name], MD as Consulting Physician (General Surgery)  Date of Service:  07/12/2019  CHIEF COMPLAINT: F/u ofcholangiocarcinomaof liver  SUMMARY OF ONCOLOGIC HISTORY: Oncology History  Malignant neoplasm of female breast ([place])  11/06/2018 Genetic Testing   Negative genetic testing on the [employer] Common Hereditary Cancers panel. A variant of uncertain significance was identified in one of her APC genes, called c.1243G>A (p.Ala415Thr).  The Common Hereditary Cancers Panel offered by [facility] includes sequencing and/or deletion duplication testing of the following 48 genes: APC, ATM, AXIN2, BARD1, BMPR1A, BRCA1, BRCA2, BRIP1, CDH1, CDK4, CDKN2A (p14ARF), CDKN2A (p16INK4a), CHEK2, CTNNA1, DICER1, EPCAM (Deletion/duplication testing only), GREM1 (promoter region deletion/duplication testing only), KIT, MEN1, MLH1, MSH2, MSH3, MSH6, MUTYH, NBN, NF1, NHTL1, PALB2, PDGFRA, PMS2, POLD1, POLE, PTEN, RAD50, RAD51C, RAD51D, RNF43, SDHB, SDHC, SDHD, SMAD4, SMARCA4. STK11, TP53, TSC1, TSC2, and VHL.  The following genes were evaluated for sequence changes only: SDHA and HOXB13 c.251G>A variant only.    Intrahepatic cholangiocarcinoma ([place])  09/07/2018 Imaging   CT Chest IMPRESSION: 1. New, enhancing mass involving segment 4 of the liver and fundus of  gallbladder is concerning for malignancy. This may represent either metastatic disease from breast cancer or neoplasm primary to the liver or hepatic biliary tree. Further evaluation with contrast enhanced CT of the abdomen and pelvis is recommended. 2. No findings to suggest metastatic disease within the chest. 3.  Aortic Atherosclerosis (ICD10-I70.0). 4. Coronary artery calcifications.   09/13/2018 Pathology Results   Diagnosis Liver, needle/core biopsy - ADENOCARCINOMA. Microscopic Comment Immunohistochemistry for CK7 is positive. CK20, TTF1, CDX-2, GATA-3, PAX 8, Qualitative ER, p63 and CK5/6 are negative. The provided clinical history of remote mammary carcinoma is noted. Based on the morphology and immunophenotype of the adenocarcinoma observed in this specimen, primary cholangiocarcinoma is favored. Clinical and radiologic correlation are  encouraged. Results reported to [employer] on 09/15/2018. Intradepartmental consultation (Dr. Vic Ripper).   09/13/2018 Initial Diagnosis   Cholangiocarcinoma ([place])   09/23/2018 Procedure   Colonoscopy by Dr. Havery Moros 09/23/18  IMPRESSION - Two 3 to 4 mm polyps in the ascending colon, removed with a cold snare. Resected and retrieved. - Five 3 to 5 mm polyps in the transverse colon, removed with a cold snare. Resected and retrieved. - One 5 mm polyp at the splenic flexure, removed with a cold snare. Resected and retrieved. - Three 3 to 5 mm polyps in the sigmoid colon, removed with a cold snare. Resected and retrieved. - The examination was otherwise normal. Upper Endopscy by Dr. Havery Moros 09/23/18  IMPRESSION - Esophagogastric landmarks identified. - 2 cm hiatal hernia. - Normal esophagus otherwise. - A single gastric polyp. Resected and retrieved. - Mild gastritis. Biopsied. - Normal duodenal bulb and second portion of the duodenum.   09/23/2018 Pathology Results   Diagnosis 09/23/18 1. Surgical [P], duodenum - BENIGN SMALL BOWEL  MUCOSA. - NO ACTIVE INFLAMMATION OR VILLOUS ATROPHY IDENTIFIED. 2. Surgical [P], stomach, polyp - HYPERPLASTIC POLYP(S). - THERE IS NO EVIDENCE OF  MALIGNANCY. 3. Surgical [P], gastric antrum and gastric body - CHRONIC INACTIVE GASTRITIS. - THERE IS NO EVIDENCE OF HELICOBACTER-PYLORI, DYSPLASIA, OR MALIGNANCY. - SEE COMMENT. 4. Surgical [P], colon, sigmoid, splenic flexure, transverse and ascending, polyp (9) - TUBULAR ADENOMA(S). - SESSILE SERRATED POLYP WITHOUT CYTOLOGIC DYSPLASIA. - HIGH GRADE DYSPLASIA IS NOT IDENTIFIED. 5. Surgical [P], colon, sigmoid, polyp (2) - HYPERPLASTIC POLYP(S). - THERE IS NO EVIDENCE OF MALIGNANCY.   09/23/2018 Cancer Staging   Staging form: Intrahepatic Bile Duct, AJCC 8th Edition - Clinical stage from 09/23/2018: Stage IB (cT1b, cN0, cM0) - Signed by [name], MD on 10/06/2018   09/29/2018 PET scan   PET 09/29/18 IMPRESSION: 1. Hypermetabolic mass in the RIGHT hepatic lobe consistent with biopsy proven adenocarcinoma. No additional liver metastasis. 2. No evidence of local breast cancer recurrence in the RIGHT breast or RIGHT axilla. 3. Mild bilateral hypermetabolic adrenal glands is favored benign hyperplasia. 4. No evidence of additional metastatic disease on skull base to thigh FDG PET scan.   11/06/2018 Genetic Testing   Negative genetic testing on the [facility] Common Hereditary Cancers panel. A variant of uncertain significance was identified in one of her APC genes, called c.1243G>A (p.Ala415Thr).  The Common Hereditary Cancers Panel offered by [facility] includes sequencing and/or deletion duplication testing of the following 48 genes: APC, ATM, AXIN2, BARD1, BMPR1A, BRCA1, BRCA2, BRIP1, CDH1, CDK4, CDKN2A (p14ARF), CDKN2A (p16INK4a), CHEK2, CTNNA1, DICER1, EPCAM (Deletion/duplication testing only), GREM1 (promoter region deletion/duplication testing only), KIT, MEN1, MLH1, MSH2, MSH3, MSH6, MUTYH, NBN, NF1, NHTL1, PALB2, PDGFRA, PMS2, POLD1, POLE,  PTEN, RAD50, RAD51C, RAD51D, RNF43, SDHB, SDHC, SDHD, SMAD4, SMARCA4. STK11, TP53, TSC1, TSC2, and VHL.  The following genes were evaluated for sequence changes only: SDHA and HOXB13 c.251G>A variant only.    11/17/2018 Pathology Results   Diagnosis 11/17/18 1. Soft tissue, biopsy, Diaphragmatic nodules - METASTATIC ADENOCARCINOMA, CONSISTENT WITH PATIENT'S CLINICAL HISTORY OF CHOLANGIOCARCINOMA. SEE NOTE 2. Liver, biopsy, Left - LIVER PARENCHYMA WITH A BENIGN FIBROTIC NODULE - NO EVIDENCE OF MALIGNANCY 3. Stomach, biopsy - BENIGN PAPILLARY MESOTHELIAL HYPERPLASIA - NO EVIDENCE OF MALIGNANCY   11/29/2018 Imaging   CT CAP WO Contrast  IMPRESSION: 1. Dominant liver mass appears grossly stable from 09/29/2018. Additional liver lesions are too small to characterize but were not shown to be hypermetabolic on PET. 2. Mild nodularity of both adrenal glands with associated hypermetabolism on 09/29/2018. Continued attention on follow-up exams is warranted. 3. Small right lower lobe nodules, stable from 09/07/2018. Again, attention on follow-up is recommended. 4. Trace bilateral pleural fluid. 5. Aortic atherosclerosis (ICD10-170.0). Coronary artery calcification. 6. Enlarged pulmonic trunk, indicative of pulmonary arterial hypertension.     11/30/2018 - 06/14/2019 Chemotherapy   First line chemo Oxaliplatin and gemcitabine q2weeks starting 11/30/18. Stopped Oxaliplatin on 05/03/19 due to worsening neuropathy. Added Cisplatin with C13 on 05/17/19 and stopped on C15 06/14/19 due to neuropathy. Reduced to maintenance single agent Gemcitabine on 06/28/19.    01/20/2019 Imaging   CT CAP IMPRESSION: restaging  1. Mild interval increase in size of dominant liver mass involving segment 4 and segment 5. 2. No significant or progressive adrenal nodularity identified to suggest metastatic disease. 3. Unchanged appearance of small right lower lobe and lingular lung nodules, nonspecific.   03/21/2019 PET scan     IMPRESSION: 1. Large right hepatic mass with SUV uptake near background hepatic activity, dramatic response to therapy, also with decrease in size when compared to the prior study. 2. Signs of prior right mastectomy and axillary dissection as  before. 3. Left adrenal activity in no longer above the level of activity seen in the contralateral, right adrenal gland or in the liver. Attention on follow-up. 4. No new signs of disease.   06/27/2019 PET scan   IMPRESSION: 1. Further decrease in size of a dominant hepatic mass which is non FDG avid. 2. No evidence of metastatic disease. 3. No evidence of left adrenal hypermetabolism or mass. 4. Incidental findings, including: Uterine fibroids. Coronary artery atherosclerosis. Aortic Atherosclerosis (ICD10-I70.0). Pulmonary artery enlargement suggests pulmonary arterial hypertension.   06/28/2019 -  Chemotherapy   Maintenance single agent Gemcitabine q2weeks  starting on 06/28/19 with C16.       CURRENT THERAPY:  Maintenance single agent Gemcitabine q2weeks starting on 06/28/19 with C16.    INTERVAL HISTORY:  Donna May is here for a follow up of treatment. She presents to the clinic alone. She notes she is stable. She notes she tolerated chemo well with Gemcitabine alone. She denies nausea and only has fatigue right after treatment. She would have occasional periods of enough energy to do more things. She notes neuropathy has been holding her back from doing more yard work because it effects gripping and she drop things. She is on Gabapentin '200mg'$  at night and '100mg'$  in the day. She is still able to drive herself only short distances. She notes she did not think she received call for PT before but will watch out for it this time. She notes she is living alone and able to get most things done. She is open to home care if needed.     REVIEW OF SYSTEMS:   Constitutional: Denies fevers, chills or abnormal weight loss Eyes: Denies blurriness  of vision Ears, nose, mouth, throat, and face: Denies mucositis or sore throat Respiratory: Denies cough, dyspnea or wheezes Cardiovascular: Denies palpitation, chest discomfort or lower extremity swelling Gastrointestinal:  Denies nausea, heartburn or change in bowel habits Skin: Denies abnormal skin rashes Lymphatics: Denies new lymphadenopathy or easy bruising Neurological: (+) Neuropathy in her hands Behavioral/Psych: Mood is stable, no new changes  All other systems were reviewed with the patient and are negative.  MEDICAL HISTORY:  Past Medical History:  Diagnosis Date  . Anemia   . Anxiety   . Arthritis   . Breast cancer ([place]) 1992  . Cataract    Bilateral eyes - surgery to remove  . Chronic kidney disease    CKD stage 3  . Complication of anesthesia    "something they use make me itch for a couple of days."  . Depression   . Diabetes mellitus    type 2  . Duodenitis 01/18/2002  . Fainting spell   . Family history of breast cancer   . Family history of prostate cancer   . GERD (gastroesophageal reflux disease)   . Heart murmur    never has caused any problems  . Hiatal hernia 08/08/2008, 01/18/2002  . History of pneumonia    x 2  . Hyperlipidemia   . Hypertension   . Liver cancer ([place]) 08/2018  . Lymphedema 2017   Right arm  . Pneumonia    x 2  . UTI (lower urinary tract infection)     SURGICAL HISTORY: Past Surgical History:  Procedure Laterality Date  . AXILLARY SURGERY     cyst removal, right  . COLONOSCOPY  09/23/2018   Dr. Havery Moros - polyps  . EYE SURGERY Bilateral    cataracts to remove  . LAPAROSCOPY N/A 11/17/2018  Procedure: LAPAROSCOPY DIAGNOSTIC, INTRAOPERATIVE ULTRASOUND, PERITONEAL BIOPSIES;  Surgeon: [name], MD;  Location: [place];  Service: General;  Laterality: N/A;  GENERAL AND EPIDURAL  . LIVER BIOPSY  08/2018   Dr. Lindwood Coke  . MASTECTOMY  1992   right, with flap  . NM MYOCAR PERF WALL MOTION  06/11/2009   Protocol:[place], post  stress EF58%, EKG negative for ischemia, low risk  . PORTACATH PLACEMENT N/A 12/02/2018   Procedure: INSERTION PORT-A-CATH;  Surgeon: [name], MD;  Location: [place];  Service: General;  Laterality: N/A;  . RECONSTRUCTION BREAST W/ TRAM FLAP Right   . TONSILLECTOMY    . TRANSTHORACIC ECHOCARDIOGRAM  12/24/2009   LVEF =>55%, normal study  . UPPER GI ENDOSCOPY      I have reviewed the social history and family history with the patient and they are unchanged from previous note.  ALLERGIES:  has No Known Allergies.  MEDICATIONS:  Current Outpatient Medications  Medication Sig Dispense Refill  . amLODipine (NORVASC) 10 MG tablet TAKE 1 TABLET BY MOUTH EVERY DAY 90 tablet 3  . aspirin 81 MG tablet Take 81 mg by mouth at bedtime.     . Calcium Carbonate-Vitamin D 600-200 MG-UNIT TABS Take 1 tablet by mouth daily.    Marland Kitchen CALCIUM-MAGNESIUM-VITAMIN D PO Take 1 tablet by mouth once a week.     . Cholecalciferol (VITAMIN D3) 2000 UNITS TABS Take 1 tablet by mouth once a week.     . DULoxetine (CYMBALTA) 30 MG capsule Take 1 capsule (30 mg total) by mouth daily. 90 capsule 1  . Echinacea-Goldenseal (ECHINACEA COMB/GOLDEN SEAL) CAPS Take by mouth.    . gabapentin (NEURONTIN) 100 MG capsule Take 1-3 capsules (100-300 mg total) by mouth 3 (three) times daily. Take 1 cap twice daily during day and 3 cap at night 360 capsule 1  . glucose blood test strip 1 each by Other route 2 (two) times daily. Use Onetouch verio test strips as instructed to check blood sugar twice daily. 100 each 2  . hydrochlorothiazide (HYDRODIURIL) 25 MG tablet Take 1 tablet (25 mg total) by mouth daily. 90 tablet 3  . HYDROcodone-acetaminophen (NORCO/VICODIN) 5-325 MG tablet Take 1 tablet by mouth every 6 (six) hours as needed for moderate pain. 10 tablet 0  . ketoconazole (NIZORAL) 2 % cream Apply 1 fingertip amount to each foot daily. 30 g 0  . lansoprazole (PREVACID) 15 MG capsule Take 1 capsule (15 mg total) by mouth daily. 90  capsule 1  . lidocaine-prilocaine (EMLA) cream Apply to affected area once 90 g 1  . loratadine (CLARITIN) 10 MG tablet Take 10 mg by mouth daily as needed for allergies.    . metFORMIN (GLUCOPHAGE-XR) 500 MG 24 hr tablet Take 1 tablet (500 mg total) by mouth 2 (two) times daily before a meal. 180 tablet 1  . metoprolol succinate (TOPROL-XL) 100 MG 24 hr tablet Take 1 tablet (100 mg total) by mouth daily. Take with or immediately following a meal. 90 tablet 1  . Omega 3 1000 MG CAPS Take 2,000 mg by mouth daily.     . ondansetron (ZOFRAN) 8 MG tablet Take 1 tablet (8 mg total) by mouth every 8 (eight) hours as needed for nausea or vomiting. (Patient not taking: Reported on 05/31/2019) 30 tablet 0  . Prenatal Vit-Fe Fumarate-FA (MULTIVITAMIN-PRENATAL) 27-0.8 MG TABS tablet Take 1 tablet by mouth daily at 12 noon.    . prochlorperazine (COMPAZINE) 10 MG tablet Take 1 tablet (10 mg total)  by mouth every 6 (six) hours as needed for nausea or vomiting. (Patient not taking: Reported on 05/31/2019) 30 tablet 0  . raloxifene (EVISTA) 60 MG tablet TAKE 1 TABLET DAILY 90 tablet 0  . rosuvastatin (CRESTOR) 20 MG tablet Take 1 tablet (20 mg total) by mouth daily. 90 tablet 1  . sitaGLIPtin (JANUVIA) 100 MG tablet Take 1 tablet (100 mg total) by mouth daily. 90 tablet 4  . triamcinolone cream (KENALOG) 0.1 % Apply 1 application topically 2 (two) times daily. (Patient not taking: Reported on 05/31/2019) 30 g 0  . valsartan (DIOVAN) 320 MG tablet Take 1 tablet (320 mg total) by mouth daily. 90 tablet 3   Current Facility-Administered Medications  Medication Dose Route Frequency Provider Last Rate Last Admin  . triamcinolone acetonide (KENALOG) 10 MG/ML injection 10 mg  10 mg Other Once Harriet Masson, DPM        PHYSICAL EXAMINATION: ECOG PERFORMANCE STATUS: 1 - Symptomatic but completely ambulatory  Vitals:   07/12/19 0845  BP: (!) 148/64  Pulse: 75  Resp: 18  Temp: 98.9 F (37.2 C)  SpO2: 97%    Filed Weights   07/12/19 0845  Weight: 136 lb 4.8 oz (61.8 kg)    Due to COVID19 we will limit examination to appearance. Patient had no complaints.  GENERAL:alert, no distress and comfortable SKIN: skin color normal, no rashes or significant lesions EYES: normal, Conjunctiva are pink and non-injected, sclera clear  NEURO: alert & oriented x 3 with fluent speech   LABORATORY DATA:  I have reviewed the data as listed CBC Latest Ref Rng & Units 07/12/2019 06/28/2019 06/14/2019  WBC 4.0 - 10.5 K/uL 4.6 6.5 5.0  Hemoglobin 12.0 - 15.0 g/dL 8.3(L) 8.6(L) 8.8(L)  Hematocrit 36.0 - 46.0 % 27.0(L) 27.3(L) 28.0(L)  Platelets 150 - 400 K/uL 171 189 152     CMP Latest Ref Rng & Units 07/12/2019 06/28/2019 06/14/2019  Glucose 70 - 99 mg/dL 109(H) 87 135(H)  BUN 8 - 23 mg/dL '19 18 20  '$ Creatinine 0.44 - 1.00 mg/dL 1.45(H) 1.22(H) 1.27(H)  Sodium 135 - 145 mmol/L 142 141 143  Potassium 3.5 - 5.1 mmol/L 3.7 3.8 3.7  Chloride 98 - 111 mmol/L 109 108 108  CO2 22 - 32 mmol/L '25 23 27  '$ Calcium 8.9 - 10.3 mg/dL 8.9 8.8(L) 8.7(L)  Total Protein 6.5 - 8.1 g/dL 6.7 7.0 6.6  Total Bilirubin 0.3 - 1.2 mg/dL 0.4 0.5 0.4  Alkaline Phos 38 - 126 U/L 46 54 62  AST 15 - 41 U/L 26 32 26  ALT 0 - 44 U/L '7 11 11      '$ RADIOGRAPHIC STUDIES: I have personally reviewed the radiological images as listed and agreed with the findings in the report. No results found.   ASSESSMENT & PLAN:  Donna May is a 79 y.o. female with    1.Intrahepatic cholangiocarcinoma, cT1N0M1,with peritoneal metastasis, MSS, IDH1 mutation (+) -She was diagnosed in 08/2018. Her biopsy of her liver mass shows adenocarcinoma,most consistent withcholangiocarcinoma.Her EGD/Colonoscopy from 7/10/20was negativeand 09/29/18 PET scanshowedno evidence of distant metastasis. -She was brought to OR on 9/3,unfortunately she was found to have peritoneal metastasis to right diaphragm and surgery was aborted. -Given her metastatic  cancer, Istarted her onsystemicchemo withFirst line chemoOxaliplatinand gemcitabine q2weeksto control her disease.She started on 11/30/18.Stopped Oxaliplatin on 05/03/19 due to worsening neuropathy.AddedCisplatin with C13 on 05/17/19. Due to progressing neuropathy cisplatin was stopped on 06/14/19 and she proceeding with single agent Gemcitabine every 2 weeks. -She  is overall stable. We discussed at length the impact of Neuropathy on her quality of life and options to help improve her neuropathy and aid at home.  -Labs reviewed, anemia stable. Overall adequate to proceed with C17 Gemcitabine. Continue every 2 weeks. Will continue to monitor her Neuropathy. We discussed that gemcitabine usually does not cause neuropathy, but if her neuropathy continue gets worse, I may stop gemcitabine.  Patient again is very concerned about cancer progression if chemotherapy is held. -F/u in 2 weeks   2. Anemia -Secondary to chemo  -She previously required blood transfusion on 03/29/19. Will continue prenatal vitamin.  -Moderate and stable.   3. Neuropathy, G2 -secondary to Oxaliplatin and Cisplatin, both currently d/c.  -She is currently on'100mg'$  Gabapentin '200mg'$  nightly and 1 tabs during the day. She can continue to increase if needed and tolerable. She continue Cymbalta and Prenatal Vitamin.  -She still has tingling and numbness of hands and feet. She ambulates with cane and her lack of gripping is effecting her quality of life.  -I discussed her neuropathy will likely not 100% recover but can improve partially slowly over time.  -I again recommend PT and OT to help, she is interested in both. I will send referral and encouraged her to watch out for their phone call.  -She is open to home care, I will see who is able to do this under her insurance. She will think about it.   4. H/o right breast cancer, Geneticsnegative -s/p mastectomy in 1992, per patient did not require adjuvant therapy  -followed by  Dr. Jana Hakim -Her genetic testing wasnegativefor pathogeneticmutations. She did have VUS of gene APC.  5.Comorbidities:DM,HTN,hiatal hernia, GERD, renal artery stenosis -f/u with PCP -We will monitor her blood pressure and glucose level closely during her chemo. We will adjust her meds as needed -She did not tolerate Glucerna, she will continue low sugar protein shakes.  6. Family support  -she has a son and daughterwho are often busy but help as needed.  -She notes they are aware of her condition. I will update her children today.  7.Goal of care discussion  -The patient understands the goal of care is palliative. -she is full code now  8.Hypertension, uncontrolled -She is on amlodipine and HTCZ, metoprolol and losartan. I encouraged her to make sure she takes daily and f/u with PCP for possible dose adjustment given recent elevated BP in clinic.   9. Taste change, weight loss  -With recent taste change and loss of taste she has lost 5-6 pounds over 2 weeks.  -She does not try to add more salt or sugar in food given her DM and HTN.  -I discussed if she is eating less she should take more nutritional supplements daily such as Glucerna 2 bottles a day. -She has mild weight loss due to recent diarrhea. I recommended she take Miralax and increase water intake.  -She will continue to f/u with Dietician.   10. Elevated Cr  -I strongly encouraged her to drink more water at least 40 ounces daily to maintain kidney function. -Was improving but has increased recently. Continue to monitor.    PLAN: -Refer to PT/OT for her referral neuropathy -I refilled Gabapentin and Cymbalta today, will slightly increase her gabapentin dose -Labs reviewed and adequate to proceed with C17 Gemcitabine today -Lab, flush, f/uandGemcitabine in 2and 4 weeks   No problem-specific Assessment & Plan notes found for this encounter.   Orders Placed This Encounter  Procedures   . Ambulatory referral  to Physical Therapy    Referral Priority:   Routine    Referral Type:   Physical Medicine    Referral Reason:   Specialty Services Required    Requested Specialty:   Physical Therapy    Number of Visits Requested:   1  . OT eval and treat    Neuropathy from chemo    Standing Status:   Future    Standing Expiration Date:   07/11/2020   All questions were answered. The patient knows to call the clinic with any problems, questions or concerns. No barriers to learning was detected. The total time spent in the appointment was 30 minutes.     [name], MD 07/12/2019   I, Joslyn Devon, am acting as scribe for [name], MD.   I have reviewed the above documentation for accuracy and completeness, and I agree with the above.

## 2019-07-09 ENCOUNTER — Other Ambulatory Visit: Payer: Self-pay | Admitting: Nurse Practitioner

## 2019-07-09 DIAGNOSIS — T451X5A Adverse effect of antineoplastic and immunosuppressive drugs, initial encounter: Secondary | ICD-10-CM

## 2019-07-09 DIAGNOSIS — G620 Drug-induced polyneuropathy: Secondary | ICD-10-CM

## 2019-07-10 NOTE — Progress Notes (Signed)
Pharmacist Chemotherapy Monitoring - Follow Up Assessment    I verify that I have reviewed each item in the below checklist:  . Regimen for the patient is scheduled for the appropriate day and plan matches scheduled date. Marland Kitchen Appropriate non-routine labs are ordered dependent on drug ordered. . If applicable, additional medications reviewed and ordered per protocol based on lifetime cumulative doses and/or treatment regimen.   Plan for follow-up and/or issues identified: No . I-vent associated with next due treatment: No . MD and/or nursing notified: No  Acquanetta Belling 07/10/2019 4:09 PM

## 2019-07-12 ENCOUNTER — Inpatient Hospital Stay: Payer: Medicare Other | Admitting: Nutrition

## 2019-07-12 ENCOUNTER — Telehealth: Payer: Self-pay | Admitting: Nutrition

## 2019-07-12 ENCOUNTER — Inpatient Hospital Stay: Payer: Medicare Other

## 2019-07-12 ENCOUNTER — Other Ambulatory Visit: Payer: Self-pay

## 2019-07-12 ENCOUNTER — Inpatient Hospital Stay (HOSPITAL_BASED_OUTPATIENT_CLINIC_OR_DEPARTMENT_OTHER): Payer: Medicare Other | Admitting: Hematology

## 2019-07-12 ENCOUNTER — Encounter: Payer: Self-pay | Admitting: Hematology

## 2019-07-12 VITALS — BP 148/64 | HR 75 | Temp 98.9°F | Resp 18 | Ht 65.0 in | Wt 136.3 lb

## 2019-07-12 DIAGNOSIS — Z95828 Presence of other vascular implants and grafts: Secondary | ICD-10-CM

## 2019-07-12 DIAGNOSIS — C786 Secondary malignant neoplasm of retroperitoneum and peritoneum: Secondary | ICD-10-CM | POA: Diagnosis not present

## 2019-07-12 DIAGNOSIS — T451X5A Adverse effect of antineoplastic and immunosuppressive drugs, initial encounter: Secondary | ICD-10-CM | POA: Diagnosis not present

## 2019-07-12 DIAGNOSIS — G620 Drug-induced polyneuropathy: Secondary | ICD-10-CM

## 2019-07-12 DIAGNOSIS — C221 Intrahepatic bile duct carcinoma: Secondary | ICD-10-CM

## 2019-07-12 DIAGNOSIS — Z5111 Encounter for antineoplastic chemotherapy: Secondary | ICD-10-CM | POA: Diagnosis not present

## 2019-07-12 DIAGNOSIS — D6481 Anemia due to antineoplastic chemotherapy: Secondary | ICD-10-CM | POA: Diagnosis not present

## 2019-07-12 LAB — CBC WITH DIFFERENTIAL (CANCER CENTER ONLY)
Abs Immature Granulocytes: 0.02 10*3/uL (ref 0.00–0.07)
Basophils Absolute: 0 10*3/uL (ref 0.0–0.1)
Basophils Relative: 0 %
Eosinophils Absolute: 0.1 10*3/uL (ref 0.0–0.5)
Eosinophils Relative: 2 %
HCT: 27 % — ABNORMAL LOW (ref 36.0–46.0)
Hemoglobin: 8.3 g/dL — ABNORMAL LOW (ref 12.0–15.0)
Immature Granulocytes: 0 %
Lymphocytes Relative: 26 %
Lymphs Abs: 1.2 10*3/uL (ref 0.7–4.0)
MCH: 31 pg (ref 26.0–34.0)
MCHC: 30.7 g/dL (ref 30.0–36.0)
MCV: 100.7 fL — ABNORMAL HIGH (ref 80.0–100.0)
Monocytes Absolute: 0.9 10*3/uL (ref 0.1–1.0)
Monocytes Relative: 20 %
Neutro Abs: 2.3 10*3/uL (ref 1.7–7.7)
Neutrophils Relative %: 52 %
Platelet Count: 171 10*3/uL (ref 150–400)
RBC: 2.68 MIL/uL — ABNORMAL LOW (ref 3.87–5.11)
RDW: 20.7 % — ABNORMAL HIGH (ref 11.5–15.5)
WBC Count: 4.6 10*3/uL (ref 4.0–10.5)
nRBC: 0 % (ref 0.0–0.2)

## 2019-07-12 LAB — CMP (CANCER CENTER ONLY)
ALT: 7 U/L (ref 0–44)
AST: 26 U/L (ref 15–41)
Albumin: 3.4 g/dL — ABNORMAL LOW (ref 3.5–5.0)
Alkaline Phosphatase: 46 U/L (ref 38–126)
Anion gap: 8 (ref 5–15)
BUN: 19 mg/dL (ref 8–23)
CO2: 25 mmol/L (ref 22–32)
Calcium: 8.9 mg/dL (ref 8.9–10.3)
Chloride: 109 mmol/L (ref 98–111)
Creatinine: 1.45 mg/dL — ABNORMAL HIGH (ref 0.44–1.00)
GFR, Est AFR Am: 39 mL/min — ABNORMAL LOW (ref >60)
GFR, Estimated: 34 mL/min — ABNORMAL LOW (ref >60)
Glucose, Bld: 109 mg/dL — ABNORMAL HIGH (ref 70–99)
Potassium: 3.7 mmol/L (ref 3.5–5.1)
Sodium: 142 mmol/L (ref 135–145)
Total Bilirubin: 0.4 mg/dL (ref 0.3–1.2)
Total Protein: 6.7 g/dL (ref 6.5–8.1)

## 2019-07-12 LAB — MAGNESIUM: Magnesium: 2.2 mg/dL (ref 1.7–2.4)

## 2019-07-12 MED ORDER — PROCHLORPERAZINE MALEATE 10 MG PO TABS
ORAL_TABLET | ORAL | Status: AC
  Filled 2019-07-12: qty 1

## 2019-07-12 MED ORDER — SODIUM CHLORIDE 0.9 % IV SOLN
Freq: Once | INTRAVENOUS | Status: AC
  Filled 2019-07-12: qty 250

## 2019-07-12 MED ORDER — HEPARIN SOD (PORK) LOCK FLUSH 100 UNIT/ML IV SOLN
500.0000 [IU] | Freq: Once | INTRAVENOUS | Status: AC | PRN
  Administered 2019-07-12: 11:00:00 500 [IU]
  Filled 2019-07-12: qty 5

## 2019-07-12 MED ORDER — DULOXETINE HCL 30 MG PO CPEP: 30.0000 mg | ORAL_CAPSULE | Freq: Every day | ORAL | 1 refills | Status: DC

## 2019-07-12 MED ORDER — SODIUM CHLORIDE 0.9% FLUSH
10.0000 mL | Freq: Once | INTRAVENOUS | Status: AC
  Administered 2019-07-12: 08:00:00 10 mL
  Filled 2019-07-12: qty 10

## 2019-07-12 MED ORDER — PROCHLORPERAZINE MALEATE 10 MG PO TABS
10.0000 mg | ORAL_TABLET | Freq: Once | ORAL | Status: AC
  Administered 2019-07-12: 10:00:00 10 mg via ORAL

## 2019-07-12 MED ORDER — SODIUM CHLORIDE 0.9% FLUSH
10.0000 mL | INTRAVENOUS | Status: DC | PRN
  Administered 2019-07-12: 11:00:00 10 mL
  Filled 2019-07-12: qty 10

## 2019-07-12 MED ORDER — SODIUM CHLORIDE 0.9 % IV SOLN
1000.0000 mg/m2 | Freq: Once | INTRAVENOUS | Status: AC
  Administered 2019-07-12: 10:00:00 1748 mg via INTRAVENOUS
  Filled 2019-07-12: qty 45.97

## 2019-07-12 MED ORDER — GABAPENTIN 100 MG PO CAPS: 100.0000 mg | ORAL_CAPSULE | Freq: Three times a day (TID) | ORAL | 1 refills | Status: DC

## 2019-07-12 NOTE — Progress Notes (Signed)
See telephone note.

## 2019-07-12 NOTE — Patient Instructions (Signed)
[facility] Discharge Instructions for Patients Receiving Chemotherapy  Today you received the following chemotherapy agents Gemcitabine (GEMZAR).  To help prevent nausea and vomiting after your treatment, we encourage you to take your nausea medication as prescribed.   If you develop nausea and vomiting that is not controlled by your nausea medication, call the clinic.   BELOW ARE SYMPTOMS THAT SHOULD BE REPORTED IMMEDIATELY:  *FEVER GREATER THAN 100.5 F  *CHILLS WITH OR WITHOUT FEVER  NAUSEA AND VOMITING THAT IS NOT CONTROLLED WITH YOUR NAUSEA MEDICATION  *UNUSUAL SHORTNESS OF BREATH  *UNUSUAL BRUISING OR BLEEDING  TENDERNESS IN MOUTH AND THROAT WITH OR WITHOUT PRESENCE OF ULCERS  *URINARY PROBLEMS  *BOWEL PROBLEMS  UNUSUAL RASH Items with * indicate a potential emergency and should be followed up as soon as possible.  Feel free to call the clinic should you have any questions or concerns. The clinic phone number is [phone].  Please show the CHEMO ALERT CARD at check-in to the Emergency Department and triage nurse.   

## 2019-07-12 NOTE — Telephone Encounter (Signed)
Nutrition follow-up could not be completed with patient during infusion.  Infusion schedule was changed and patient left before RD could see patient.  I contacted patient by telephone however she was not available.  Left my name and phone number for return call.

## 2019-07-12 NOTE — Patient Instructions (Signed)

## 2019-07-13 ENCOUNTER — Telehealth: Payer: Self-pay | Admitting: Hematology

## 2019-07-13 NOTE — Telephone Encounter (Signed)
Scheduled appt per 4/28 los.  Spoke with pt and she is aware of her appt date and time.

## 2019-07-18 ENCOUNTER — Other Ambulatory Visit: Payer: Self-pay

## 2019-07-18 ENCOUNTER — Ambulatory Visit: Payer: Medicare Other | Attending: Hematology | Admitting: Physical Therapy

## 2019-07-18 DIAGNOSIS — M6281 Muscle weakness (generalized): Secondary | ICD-10-CM | POA: Diagnosis not present

## 2019-07-18 DIAGNOSIS — R209 Unspecified disturbances of skin sensation: Secondary | ICD-10-CM | POA: Insufficient documentation

## 2019-07-18 DIAGNOSIS — R262 Difficulty in walking, not elsewhere classified: Secondary | ICD-10-CM | POA: Diagnosis not present

## 2019-07-18 NOTE — Therapy (Signed)
[place], [place], [address] Phone: [phone]   Fax:  [phone]  Physical Therapy Evaluation  Patient Details  Name: Donna May MRN: [id number] Date of Birth: 07/09/1940 Referring Provider (PT): Dr. Burr Medico    Encounter Date: 07/18/2019  PT End of Session - 07/18/19 1606    Visit Number  1    Number of Visits  17    Date for PT Re-Evaluation  09/18/19    PT Start Time  4259    PT Stop Time  1445    PT Time Calculation (min)  40 min    Activity Tolerance  Patient tolerated treatment well    Behavior During Therapy  [facility] for tasks assessed/performed       Past Medical History:  Diagnosis Date  . Anemia   . Anxiety   . Arthritis   . Breast cancer ([place]) 1992  . Cataract    Bilateral eyes - surgery to remove  . Chronic kidney disease    CKD stage 3  . Complication of anesthesia    "something they use make me itch for a couple of days."  . Depression   . Diabetes mellitus    type 2  . Duodenitis 01/18/2002  . Fainting spell   . Family history of breast cancer   . Family history of prostate cancer   . GERD (gastroesophageal reflux disease)   . Heart murmur    never has caused any problems  . Hiatal hernia 08/08/2008, 01/18/2002  . History of pneumonia    x 2  . Hyperlipidemia   . Hypertension   . Liver cancer ([place]) 08/2018  . Lymphedema 2017   Right arm  . Pneumonia    x 2  . UTI (lower urinary tract infection)     Past Surgical History:  Procedure Laterality Date  . AXILLARY SURGERY     cyst removal, right  . COLONOSCOPY  09/23/2018   Dr. Havery Moros - polyps  . EYE SURGERY Bilateral    cataracts to remove  . LAPAROSCOPY N/A 11/17/2018   Procedure: LAPAROSCOPY DIAGNOSTIC, INTRAOPERATIVE ULTRASOUND, PERITONEAL BIOPSIES;  Surgeon: [name], MD;  Location: [facility];  Service: General;  Laterality: N/A;  GENERAL AND EPIDURAL  . LIVER BIOPSY  08/2018   Dr. Lindwood Coke  . MASTECTOMY   1992   right, with flap  . NM MYOCAR PERF WALL MOTION  06/11/2009   Protocol:Bruce, post stress EF58%, EKG negative for ischemia, low risk  . PORTACATH PLACEMENT N/A 12/02/2018   Procedure: INSERTION PORT-A-CATH;  Surgeon: [name], MD;  Location: [facility];  Service: General;  Laterality: N/A;  . RECONSTRUCTION BREAST W/ TRAM FLAP Right   . TONSILLECTOMY    . TRANSTHORACIC ECHOCARDIOGRAM  12/24/2009   LVEF =>55%, normal study  . UPPER GI ENDOSCOPY      There were no vitals filed for this visit.   Subjective Assessment - 07/18/19 1405    Subjective  "My feet are tingling all the way up to here"  pt indicates her entire legs up to groin area.  Pt aslo describes lots of diffuclty with her self care and balance while walking.  She wants to know if this therapy will help her    Pertinent History  Pt with Intrahepaticholangiocarcinoma with metastasis.  She was treated with chemotherapy and developed neuropathy in hands and feet. She is on an ongoing maintenance chemo every other week.  Past history includes breast cancer in 1992 with  ALND and had problems with lymphedema in 2017.  She says she has a sleeve but she doesn't wear it  that much History includes chronic kidney disease and diabetes type II    Patient Stated Goals  "to be able to be doing sonething for myself"    Currently in Pain?  Yes   neuropathy pain   Pain Score  10-Worst pain ever   for the neuropathy   Pain Location  --   both hands feet and legs   Pain Orientation  Right;Left;Anterior;Posterior;Distal;Lower    Pain Descriptors / Indicators  Shooting;Numbness;Tingling;Stabbing   pain symptoms vary, pt verbal in desciptions   Pain Type  Neuropathic pain    Pain Onset  More than a month ago    Pain Frequency  Intermittent   varies        [facility] PT Assessment - 07/18/19 0001      Assessment   Medical Diagnosis  liver cancer    past history of breast cancer    Referring Provider (PT)  Dr. Burr Medico     Hand Dominance   Right      Precautions   Precautions  Fall    Precaution Comments  past history of right breast cancer and lymphedema      Restrictions   Weight Bearing Restrictions  No      Balance Screen   Has the patient fallen in the past 6 months  Yes    How many times?  1    Has the patient had a decrease in activity level because of a fear of falling?   Yes    Is the patient reluctant to leave their home because of a fear of falling?   Yes      [place]  Private residence    Living Arrangements  Alone    Available Help at Discharge  Family   son and daughter live in town    Type of [place] to enter    Entrance Stairs-Number of Steps  [place]  One level      Prior Function   Level of Independence  Independent with household mobility with device   pt probably needs help but doesn't have it    Vocation  Retired    Leisure  she tries to work her legs and hands , she doesn't go for walks       Cognition   Overall Cognitive Status  Within Functional Limits for tasks assessed      Observation/Other Assessments   Observations  pt comes using her straight cane for balance, She appears to be fatigued from walking in from parking lot and for pt registrations.  She has visible lymphedem in her right forearm and upper arm that she does not want to have treated  She appears to have muscle atrophy     Skin Integrity  no open areas seen       Coordination   Gross Motor Movements are Fluid and Coordinated  No   limited by nueropathy pain      Functional Tests   Functional tests  Sit to Stand      Sit to Stand   Comments  5 reps in 30 sec       Posture/Postural Control   Posture/Postural Control  No significant limitations    Postural Limitations  Rounded Shoulders;Forward head  ROM / Strength   AROM / PROM / Strength  AROM;Strength      AROM   Overall AROM   Deficits    Overall AROM Comments  right shoulder very  limited and pain ful     Right/Left Shoulder  Right;Left    Right Shoulder Flexion  105 Degrees    Right Shoulder ABduction  70 Degrees    Right Shoulder External Rotation  30 Degrees    Left Shoulder Flexion  152 Degrees    Left Shoulder ABduction  145 Degrees      Strength   Overall Strength  Deficits    Overall Strength Comments  while pt has good strength to the LE in isolated isometric testing, she has funcitonal defecits     Strength Assessment Site  Hip;Knee;Ankle    Right/Left Shoulder  Right;Left    Right Hand Grip (lbs)  40/38/40    Left Hand Grip (lbs)  40/30/40    Right/Left Hip  Right;Left    Right Hip Flexion  4/5    Left Hip Flexion  4/5    Right/Left Knee  Right;Left    Right Knee Flexion  4/5    Right Knee Extension  4/5    Left Knee Flexion  4/5    Left Knee Extension  4/5    Right/Left Ankle  Right;Left      Ambulation/Gait   Ambulation/Gait  Yes    Ambulation/Gait Assistance  6: Modified independent (Device/Increase time)   varied performancewith/ without a cane , ataxic without    Assistive device  Straight cane    Gait Pattern  Step-through pattern;Ataxic    Gait Comments  pt appears to be unsteady at times, but is able to walk with head turns, sudden stops and backwards without balance loss       Standardized Balance Assessment   Standardized Balance Assessment  Timed Up and Go Test      Timed Up and Go Test   Normal TUG (seconds)  11.49        LYMPHEDEMA/ONCOLOGY QUESTIONNAIRE - 07/18/19 1433      Right Upper Extremity Lymphedema   15 cm Proximal to Olecranon Process  30.5 cm    10 cm Proximal to Olecranon Process  33 cm    Olecranon Process  30.5 cm    15 cm Proximal to Ulnar Styloid Process  29.5 cm    10 cm Proximal to Ulnar Styloid Process  25.5 cm    Just Proximal to Ulnar Styloid Process  18 cm    Across Hand at [employer]  18.2 cm    At [place] of 2nd Digit  6.3 cm             Objective measurements completed on  examination: See above findings.                PT Short Term Goals - 07/18/19 1721      PT SHORT TERM GOAL #1   Title  Pt will increase reptitions of sit to stand in 30 seconds to 8 indicating an improvment in functional strength    Baseline  5 reps in 30 sec on 07/18/219    Time  4    Period  Weeks    Status  New      PT SHORT TERM GOAL #2   Title  Pt will report she has noticed mild improvement in her ability to carry out her tasks at home    Time  4    Period  Weeks    Status  New      PT SHORT TERM GOAL #3   Title  Pt will decrease TUG score to < 10 seconds indicting an improvemet in functional mobility    Baseline  11.49 sec    Time  4    Period  Weeks    Status  New        PT Long Term Goals - 07/18/19 1723      PT LONG TERM GOAL #1   Title  Pt will increase reptitions of sit to stand in 30 seconds to 10 indicating an improvment in functional strength    Baseline  5 on 07/18/2019    Time  8    Period  Weeks    Status  New      PT LONG TERM GOAL #2   Title  Pt will report she has noticed moderate mprovement in her ability to carry out her tasks at home    Time  8    Period  Weeks    Status  New      PT LONG TERM GOAL #3   Title  Pt will decrease TUG score to < 10 seconds indicting an improvemet in functional mobility    Baseline  11.49 on 07/18/2019    Time  8    Period  Weeks    Status  New             Plan - 07/18/19 1608    Clinical Impression Statement  Pt comes to PT after chemotherapy treatment for liver cancer that was successful but resulted in chemotherapy induced peripheral neuropathy. She is very distressed by her symptoms and her inability to do self care things and other activities that she was able to do before treatment. She has an order for OT and pt was encouraged to pursue this as she would beneft from adaptive equipment for ADLs while she is recovering from chemo. Explained to her that our focus would be on strengthening and  balance activites that would improve her function but may not improve her neuropathy pain. Pt agrees to come to PT for exercise and balance training. She does not want to address her lymphedema this session    Personal Factors and Comorbidities  Comorbidity 3+;Past/Current Experience;Age    Comorbidities  previous breast cancer with lymphedema in her right arm, ongoing chemo. DM    Examination-Activity Limitations  Reach Overhead;Locomotion Level;Stand;Stairs;Squat;Dressing;Bend    Examination-Participation Restrictions  Community Activity;Meal Prep;Cleaning;Shop;Laundry    Stability/Clinical Decision Making  Evolving/Moderate complexity    Clinical Decision Making  High    Rehab Potential  Good    Clinical Impairments Affecting Rehab Potential  history of right axillary lymph node dissection (in 1992), chemotherapy    PT Frequency  2x / week    PT Duration  8 weeks    PT Treatment/Interventions  ADLs/Self Care Home Management;Therapeutic exercise;DME Instruction;Patient/family education;Manual techniques;Manual lymph drainage;Compression bandaging;Passive range of motion;Taping;Aquatic Therapy;Gait training;Stair training;Balance training;Therapeutic activities;Functional mobility training    PT Next Visit Plan  test ankle strength, begin mat exercise for LE and core stretngth. progress balance activities       Patient will benefit from skilled therapeutic intervention in order to improve the following deficits and impairments:  Abnormal gait, Decreased balance, Decreased endurance, Difficulty walking, Impaired sensation, Impaired perceived functional ability, Increased edema, Decreased knowledge of precautions, Decreased range of motion, Decreased activity tolerance, Decreased strength, Postural dysfunction, Pain  Visit Diagnosis: Difficulty in walking, not elsewhere classified  Muscle weakness (generalized)  Unspecified disturbances of skin sensation     Problem List Patient Active  Problem List   Diagnosis Date Noted  . Port-A-Cath in place 12/14/2018  . Goals of care, counseling/discussion 11/24/2018  . Status post laparoscopy 11/17/2018  . Genetic testing 11/07/2018  . Family history of prostate cancer   . Family history of breast cancer   . Intrahepatic cholangiocarcinoma ([place]) 09/20/2018  . Coronary artery calcification 04/20/2016  . Ductal carcinoma in situ (DCIS) of right breast 11/26/2015  . Lymphedema of arm 11/26/2015  . Lymphedema 11/01/2015  . Leg pain, bilateral 12/28/2013  . Special screening for malignant neoplasms, colon 08/09/2013  . Renal artery stenosis, native, bilateral ([place]) 06/09/2013  . Diabetes mellitus with renal complications ([place]) 50/56/9794  . RASH AND OTHER NONSPECIFIC SKIN ERUPTION 07/05/2008  . Malignant neoplasm of female breast ([place]) 07/04/2008  . HYPERCHOLESTEROLEMIA 07/04/2008  . Essential hypertension 07/04/2008  . GERD 07/04/2008  . DUODENITIS, WITHOUT HEMORRHAGE 07/04/2008  . HIATAL HERNIA 07/04/2008  . Arthropathy 07/04/2008   Donato Heinz. Owens Shark PT  [place] [facility] 07/18/2019, [place], [place], [address] Phone: [phone]   Fax:  [phone]  Name: CECILIA VANCLEVE MRN: [id number] Date of Birth: [birth year]

## 2019-07-20 ENCOUNTER — Ambulatory Visit (INDEPENDENT_AMBULATORY_CARE_PROVIDER_SITE_OTHER): Payer: Medicare Other | Admitting: Family Medicine

## 2019-07-20 ENCOUNTER — Other Ambulatory Visit: Payer: Self-pay

## 2019-07-20 ENCOUNTER — Encounter: Payer: Self-pay | Admitting: Family Medicine

## 2019-07-20 VITALS — BP 126/80 | HR 72 | Temp 97.9°F | Wt 137.0 lb

## 2019-07-20 DIAGNOSIS — C221 Intrahepatic bile duct carcinoma: Secondary | ICD-10-CM

## 2019-07-20 DIAGNOSIS — E1121 Type 2 diabetes mellitus with diabetic nephropathy: Secondary | ICD-10-CM | POA: Diagnosis not present

## 2019-07-20 DIAGNOSIS — G629 Polyneuropathy, unspecified: Secondary | ICD-10-CM | POA: Diagnosis not present

## 2019-07-20 DIAGNOSIS — N1832 Chronic kidney disease, stage 3b: Secondary | ICD-10-CM

## 2019-07-20 DIAGNOSIS — I10 Essential (primary) hypertension: Secondary | ICD-10-CM | POA: Diagnosis not present

## 2019-07-20 LAB — POCT GLYCOSYLATED HEMOGLOBIN (HGB A1C): Hemoglobin A1C: 4.7 % (ref 4.0–5.6)

## 2019-07-20 LAB — POCT GLUCOSE (DEVICE FOR HOME USE): Glucose Fasting, POC: 93 mg/dL (ref 70–99)

## 2019-07-20 NOTE — Progress Notes (Signed)
Subjective:    Patient ID: Donna May, female    DOB: 06/28/1940, 79 y.o.   MRN: [id number]  No chief complaint on file.   HPI Pt is an 79 yo female with pmh sig for HTN, renal artery stenosis, GERD, intrahepatic cholangiocarcinoma with peritoneal mets, DM type II with renal manifestations, neuropathy, h/o R breast ductal carcinoma in situ s/p R mastectomy, HLD, lymphedema who was seen for f/u.  Pt endorses worsened neuropathy 2/2 Oxaliplatin and cisplatin, both d/c'd.   Now on Gemcitabine q 2 wks.  Pt states feet feel numb.  Endorses numbness and tingling in fingers.  Unable to check FSBS at home 2/2 neuropathy.  Taking gabapentin and Cymbalta.  Pt currently in rehab to strengthen hands.  Pt notes bp elevated at infusion appts and other clinic appts.  Pt states she gets nervous when there.     Past Medical History:  Diagnosis Date  . Anemia   . Anxiety   . Arthritis   . Breast cancer ([place]) 1992  . Cataract    Bilateral eyes - surgery to remove  . Chronic kidney disease    CKD stage 3  . Complication of anesthesia    "something they use make me itch for a couple of days."  . Depression   . Diabetes mellitus    type 2  . Duodenitis 01/18/2002  . Fainting spell   . Family history of breast cancer   . Family history of prostate cancer   . GERD (gastroesophageal reflux disease)   . Heart murmur    never has caused any problems  . Hiatal hernia 08/08/2008, 01/18/2002  . History of pneumonia    x 2  . Hyperlipidemia   . Hypertension   . Liver cancer ([place]) 08/2018  . Lymphedema 2017   Right arm  . Pneumonia    x 2  . UTI (lower urinary tract infection)     No Known Allergies  ROS General: Denies fever, chills, night sweats, changes in weight, changes in appetite HEENT: Denies headaches, ear pain, changes in vision, rhinorrhea, sore throat CV: Denies CP, palpitations, SOB, orthopnea Pulm: Denies SOB, cough, wheezing GI: Denies abdominal pain, nausea, vomiting, diarrhea,  constipation GU: Denies dysuria, hematuria, frequency, vaginal discharge Msk: Denies muscle cramps, joint pains Neuro: Denies weakness   + numbness, tingling   Skin: Denies rashes, bruising Psych: Denies depression, anxiety, hallucinations     Objective:    Blood pressure 126/80, pulse 72, temperature 97.9 F (36.6 C), temperature source Temporal, weight 137 lb (62.1 kg), SpO2 97 %.  Gen. Pleasant, well-nourished, in no distress, normal affect   HEENT: [place]/AT, face symmetric, conjunctiva clear, no scleral icterus, PERRLA, EOMI, nares patent without drainage Lungs: no accessory muscle use, CTAB, no wheezes or rales Cardiovascular: RRR, no m/r/g, no peripheral edema Musculoskeletal: thenar and palmar muscle atrophy b/l. No cyanosis or clubbing, normal tone Neuro:  A&Ox3, CN II-XII intact, ambulating with a cane. Skin:  Warm, no lesions/ rash   Wt Readings from Last 3 Encounters:  07/12/19 136 lb 4.8 oz (61.8 kg)  06/28/19 136 lb 6.4 oz (61.9 kg)  06/14/19 144 lb 1.6 oz (65.4 kg)    Lab Results  Component Value Date   WBC 4.6 07/12/2019   HGB 8.3 (L) 07/12/2019   HCT 27.0 (L) 07/12/2019   PLT 171 07/12/2019   GLUCOSE 109 (H) 07/12/2019   CHOL 190 09/01/2018   TRIG 46.0 09/01/2018   HDL 60.50 09/01/2018   [place]  120 (H) 09/01/2018   ALT 7 07/12/2019   AST 26 07/12/2019   NA 142 07/12/2019   K 3.7 07/12/2019   CL 109 07/12/2019   CREATININE 1.45 (H) 07/12/2019   BUN 19 07/12/2019   CO2 25 07/12/2019   TSH 1.48 04/26/2017   INR 1.1 11/09/2018   HGBA1C 5.7 (A) 03/22/2019   MICROALBUR 0.9 09/01/2018    Assessment/Plan:  Type 2 diabetes mellitus with stage 3b chronic kidney disease, without long-term current use of insulin ([place])  -Creatinine 1.45 on 07/12/2019, GFR 39. -Hemoglobin A1c this visit 4.7% -Given inconsistent use and decreased appetite, discussed taking Metformin XR 500 mg once daily. -As patient unable to check FSBS at home discussed [facility].   Pt willing to  consider.  Will let this provider or her oncologist know if she would like services. -recheck kidney function - Plan: POCT Glucose (Device for Home Use), POCT glycosylated hemoglobin (Hb A1C)  Neuropathy -2/2 oxaliplatin and cisplatin since d/c'd -Continue gabapentin and Cymbalta -Continue PT -Given handout -Plan: CMP -Given precautions  Essential hypertension -Controlled this visit -Typically elevated at other office visits likely 2/2 anxiety.  Hesitant to further increase medication. -Discussed calming techniques -Pt encouraged to check BP at home and keep a log to bring with her to clinic -Plan: CMP -Continue to monitor  Intrahepatic cholangiocarcinoma ([place]) -dx'd 08/2018 -With peritoneal mets -previously on oxaliplatin then switched to cisplatin with both subsequently being d/c'd 2/2 neuropathy -Continue Gemcitabine q 2 wks -Plan: CMP -Continue follow-up with oncology  F/u in 3-4 months, sooner if needed  Grier Mitts, MD

## 2019-07-20 NOTE — Progress Notes (Signed)
Pharmacist Chemotherapy Monitoring - Follow Up Assessment    I verify that I have reviewed each item in the below checklist:  . Regimen for the patient is scheduled for the appropriate day and plan matches scheduled date. Marland Kitchen Appropriate non-routine labs are ordered dependent on drug ordered. . If applicable, additional medications reviewed and ordered per protocol based on lifetime cumulative doses and/or treatment regimen.   Plan for follow-up and/or issues identified: No . I-vent associated with next due treatment: No . MD and/or nursing notified: No  Donna May 07/20/2019 12:44 PM

## 2019-07-20 NOTE — Patient Instructions (Signed)
Toxic Neuropathy Toxic neuropathy is nerve damage that is caused by poisons or chemicals (toxins). These toxins may be in a medicine or drug, in the environment, or at the workplace. Some examples include prescription medicines, industrial chemicals, poisons found in water or soil, insecticides, and cleaning solvents. These types of toxins can damage:  Nerve cells.  Nerve fibers.  Covering of a nerve fiber. The most common type of toxic neuropathy is damage to nerve cells that are farthest away from where the nerve leaves the spinal cord. In most cases, toxic neuropathy affects the nerves that go to the feet first. Toxic neuropathy can also damage nerve cells in the hands, arms, and legs. Damage from toxic neuropathy may improve slowly over time or it may be permanent. What are the causes? Many toxins can cause toxic neuropathy. Some common examples include:  Drugs.  Alcohol.  Pesticides.  Medicines for treating cancer (chemotherapy).  Lead.  Zinc.  Mercury.  Arsenic.  Toxins found in fish or shellfish.  Antifreeze.  Industrial chemicals like toluene. What are the signs or symptoms? Symptoms of toxic neuropathy can occur in the legs, hands, and arms. They can include:  Pain.  Loss of feeling (numbness) in the legs, hands, or arms.  Tingling in the legs, hands, or arms.  Weakness.  Trouble walking. Symptoms in other parts of the body are less common and can include:  Abnormal sweating or flushing.  Diarrhea or constipation.  Slow digestion, causing fullness or bloating.  Trouble passing urine.  Blurred vision.  Feeling dizzy.  Sexual function problems.  Cramping.  A strong urge to move the legs (restless legs). How is this diagnosed? This condition may be diagnosed based on:  Your symptoms and medical history. You will be asked about medicines or chemical exposure.  A physical exam. Your health care provider will check for numbness and  weakness. You may need to see a nerve specialist (neurologist) and have other tests to find the cause of your condition. These tests may include:  A neurological exam.  Blood and urine tests.  Electromyogram (EMG) and nerve conduction tests. These tests check nerve function. How is this treated? This condition may be treated by:  Eliminating exposure to the toxin. You may need to stop taking certain medicines, or remove contact with toxins in the environment.  Medicine to relieve pain, such as: ? Medicines that treat seizures (antiseizure medicines). ? Medicines that treat depression (antidepressants). ? Patches that are applied to the skin.  Physical therapy to help with movement, balance, and muscle strength.  Occupational therapy to learn ways to function with the condition.  Devices to help you move around (assistive devices).  Cold or heat therapy on the affected area.  Low-intensity transcutaneous electrical nerve stimulation (TENS).  Acupuncture. Follow these instructions at home: Medicines  Take over-the-counter and prescription medicines only as told by your health care provider.  Do not drive or use heavy machinery while taking prescription pain medicine. Lifestyle   Do not use any products that contain nicotine or tobacco, such as cigarettes and e-cigarettes. Smoking keeps blood from reaching damaged nerves. If you need help quitting, ask your health care provider.  Avoid or limit alcohol. Too much alcohol can cause a vitamin B deficiency, and vitamin B is needed for healthy nerves.  Eat a healthy diet. This includes: ? Eating foods that are high in fiber, such as fresh fruits and vegetables, whole grains, and beans. ? Limiting foods that are high in fat and  processed sugars, such as fried or sweet foods. Managing pain and discomfort  If directed, work with a certified massage therapist to help relieve pain.  If directed, apply heat to the affected area as  often as told by your health care provider. Use the heat source that your health care provider recommends, such as a moist heat pack or a heating pad. To do this: ? Place a towel between your skin and the heat source. ? Leave the heat on for 20-30 minutes. ? Remove the heat if your skin turns bright red. This is especially important if you are unable to feel pain, heat, or cold. You may have a greater risk of getting burned.  If directed, put ice on the painful area. To do this: ? Put ice in a plastic bag. ? Place a towel between your skin and the bag. ? Leave the ice on for 20 minutes, 2-3 times a day. Safety   Take care to avoid burning or damaging your skin if you have numbness.  If you have numbness in your feet: ? Check every day for signs of injury or infection. Watch for redness, warmth, and swelling. ? Wear padded socks and supportive shoes. These help protect your feet. General instructions  Return to your normal activities as told by your health care provider. Ask your health care provider what activities are safe for you.  Use assistive devices and attend physical therapy as told by your health care provider. This may include using a walker or a cane.  Develop a good support system. Having toxic neuropathy can be stressful.  Work with a pain specialist to find the best way to manage your pain. Ask about acupuncture, TENS therapy, or meditation techniques.  Keep all follow-up visits as told by your health care provider. This is important. Contact a health care provider if:  Your symptoms do not improve or get worse.  You have new symptoms of toxic neuropathy.  You are struggling emotionally from dealing with toxic neuropathy.  Your pain is not well-controlled.  You have side effects from any of your medicines. Get help right away if you:  You have an injury or infection that is not healing normally.  You have new weakness in an arm or leg.  You fall  frequently. Summary  Toxic neuropathy is nerve damage caused by poisons or chemicals.  The most common symptoms are pain, numbness, and tingling in your feet, legs, hands, or arms.  Eliminating exposure to the toxin is the most important part of treatment. Other treatments may be given to relieve the symptoms of neuropathy.  Recovering from toxic neuropathy takes time. Damage may be permanent in some cases. This information is not intended to replace advice given to you by your health care provider. Make sure you discuss any questions you have with your health care provider. Document Revised: 06/09/2017 Document Reviewed: 06/09/2017 Elsevier Patient Education  2020 [place] Your Hypertension Hypertension is commonly called high blood pressure. This is when the force of your blood pressing against the walls of your arteries is too strong. Arteries are blood vessels that carry blood from your heart throughout your body. Hypertension forces the heart to work harder to pump blood, and may cause the arteries to become narrow or stiff. Having untreated or uncontrolled hypertension can cause heart attack, stroke, kidney disease, and other problems. What are blood pressure readings? A blood pressure reading consists of a higher number over a lower number. Ideally, your  blood pressure should be below 120/80. The first ("top") number is called the systolic pressure. It is a measure of the pressure in your arteries as your heart beats. The second ("bottom") number is called the diastolic pressure. It is a measure of the pressure in your arteries as the heart relaxes. What does my blood pressure reading mean? Blood pressure is classified into four stages. Based on your blood pressure reading, your health care provider may use the following stages to determine what type of treatment you need, if any. Systolic pressure and diastolic pressure are measured in a unit called mm  Hg. Normal  Systolic pressure: below 798.  Diastolic pressure: below 80. Elevated  Systolic pressure: 921-194.  Diastolic pressure: below 80. Hypertension stage 1  Systolic pressure: 174-081.  Diastolic pressure: 44-81. Hypertension stage 2  Systolic pressure: 856 or above.  Diastolic pressure: 90 or above. What health risks are associated with hypertension? Managing your hypertension is an important responsibility. Uncontrolled hypertension can lead to:  A heart attack.  A stroke.  A weakened blood vessel (aneurysm).  Heart failure.  Kidney damage.  Eye damage.  Metabolic syndrome.  Memory and concentration problems. What changes can I make to manage my hypertension? Hypertension can be managed by making lifestyle changes and possibly by taking medicines. Your health care provider will help you make a plan to bring your blood pressure within a normal range. Eating and drinking   Eat a diet that is high in fiber and potassium, and low in salt (sodium), added sugar, and fat. An example eating plan is called the DASH (Dietary Approaches to Stop Hypertension) diet. To eat this way: ? Eat plenty of fresh fruits and vegetables. Try to fill half of your plate at each meal with fruits and vegetables. ? Eat whole grains, such as whole wheat pasta, brown rice, or whole grain bread. Fill about one quarter of your plate with whole grains. ? Eat low-fat diary products. ? Avoid fatty cuts of meat, processed or cured meats, and poultry with skin. Fill about one quarter of your plate with lean proteins such as fish, chicken without skin, beans, eggs, and tofu. ? Avoid premade and processed foods. These tend to be higher in sodium, added sugar, and fat.  Reduce your daily sodium intake. Most people with hypertension should eat less than 1,500 mg of sodium a day.  Limit alcohol intake to no more than 1 drink a day for nonpregnant women and 2 drinks a day for men. One drink equals  12 oz of beer, 5 oz of wine, or 1 oz of hard liquor. Lifestyle  Work with your health care provider to maintain a healthy body weight, or to lose weight. Ask what an ideal weight is for you.  Get at least 30 minutes of exercise that causes your heart to beat faster (aerobic exercise) most days of the week. Activities may include walking, swimming, or biking.  Include exercise to strengthen your muscles (resistance exercise), such as weight lifting, as part of your weekly exercise routine. Try to do these types of exercises for 30 minutes at least 3 days a week.  Do not use any products that contain nicotine or tobacco, such as cigarettes and e-cigarettes. If you need help quitting, ask your health care provider.  Control any long-term (chronic) conditions you have, such as high cholesterol or diabetes. Monitoring  Monitor your blood pressure at home as told by your health care provider. Your personal target blood pressure may vary  depending on your medical conditions, your age, and other factors.  Have your blood pressure checked regularly, as often as told by your health care provider. Working with your health care provider  Review all the medicines you take with your health care provider because there may be side effects or interactions.  Talk with your health care provider about your diet, exercise habits, and other lifestyle factors that may be contributing to hypertension.  Visit your health care provider regularly. Your health care provider can help you create and adjust your plan for managing hypertension. Will I need medicine to control my blood pressure? Your health care provider may prescribe medicine if lifestyle changes are not enough to get your blood pressure under control, and if:  Your systolic blood pressure is 130 or higher.  Your diastolic blood pressure is 80 or higher. Take medicines only as told by your health care provider. Follow the directions carefully. Blood  pressure medicines must be taken as prescribed. The medicine does not work as well when you skip doses. Skipping doses also puts you at risk for problems. Contact a health care provider if:  You think you are having a reaction to medicines you have taken.  You have repeated (recurrent) headaches.  You feel dizzy.  You have swelling in your ankles.  You have trouble with your vision. Get help right away if:  You develop a severe headache or confusion.  You have unusual weakness or numbness, or you feel faint.  You have severe pain in your chest or abdomen.  You vomit repeatedly.  You have trouble breathing. Summary  Hypertension is when the force of blood pumping through your arteries is too strong. If this condition is not controlled, it may put you at risk for serious complications.  Your personal target blood pressure may vary depending on your medical conditions, your age, and other factors. For most people, a normal blood pressure is less than 120/80.  Hypertension is managed by lifestyle changes, medicines, or both. Lifestyle changes include weight loss, eating a healthy, low-sodium diet, exercising more, and limiting alcohol. This information is not intended to replace advice given to you by your health care provider. Make sure you discuss any questions you have with your health care provider. Document Revised: 06/24/2018 Document Reviewed: 01/29/2016 Elsevier Patient Education  [place].

## 2019-07-24 NOTE — Progress Notes (Signed)
[place]   [phone] [phone]   Clinic Follow up Note   Patient Care Team: [name], MD as PCP - General (Family Medicine) [name], [name], MD as Consulting Physician (Cardiology) [name], MD as Consulting Physician (Obstetrics and Gynecology) [name], [name], NP as Nurse Practitioner (Hematology and Oncology) [facility] [name], MD as Consulting Physician (Hematology) [name], [name], MD as Consulting Physician (Gastroenterology) Virgina Evener, Dawn, RN (Inactive) as Oncology Nurse Navigator [name], MD as Consulting Physician (General Surgery)  Date of Service:  07/26/2019  CHIEF COMPLAINT: F/u ofcholangiocarcinomaof liver  SUMMARY OF ONCOLOGIC HISTORY: Oncology History  Malignant neoplasm of female breast ([place])  11/06/2018 Genetic Testing   Negative genetic testing on the [facility] Common Hereditary Cancers panel. A variant of uncertain significance was identified in one of her APC genes, called c.1243G>A (p.Ala415Thr).  The Common Hereditary Cancers Panel offered by [facility] includes sequencing and/or deletion duplication testing of the following 48 genes: APC, ATM, AXIN2, BARD1, BMPR1A, BRCA1, BRCA2, BRIP1, CDH1, CDK4, CDKN2A (p14ARF), CDKN2A (p16INK4a), CHEK2, CTNNA1, DICER1, EPCAM (Deletion/duplication testing only), GREM1 (promoter region deletion/duplication testing only), KIT, MEN1, MLH1, MSH2, MSH3, MSH6, MUTYH, NBN, NF1, NHTL1, PALB2, PDGFRA, PMS2, POLD1, POLE, PTEN, RAD50, RAD51C, RAD51D, RNF43, SDHB, SDHC, SDHD, SMAD4, SMARCA4. STK11, TP53, TSC1, TSC2, and VHL.  The following genes were evaluated for sequence changes only: SDHA and HOXB13 c.251G>A variant only.    Intrahepatic cholangiocarcinoma ([place])  09/07/2018 Imaging   CT Chest IMPRESSION: 1. New, enhancing mass involving segment 4 of the liver and fundus of gallbladder is concerning for malignancy. This may represent either  metastatic disease from breast cancer or neoplasm primary to the liver or hepatic biliary tree. Further evaluation with contrast enhanced CT of the abdomen and pelvis is recommended. 2. No findings to suggest metastatic disease within the chest. 3.  Aortic Atherosclerosis (ICD10-I70.0). 4. Coronary artery calcifications.   09/13/2018 Pathology Results   Diagnosis Liver, needle/core biopsy - ADENOCARCINOMA. Microscopic Comment Immunohistochemistry for CK7 is positive. CK20, TTF1, CDX-2, GATA-3, PAX 8, Qualitative ER, p63 and CK5/6 are negative. The provided clinical history of remote mammary carcinoma is noted. Based on the morphology and immunophenotype of the adenocarcinoma observed in this specimen, primary cholangiocarcinoma is favored. Clinical and radiologic correlation are  encouraged. Results reported to [employer] on 09/15/2018. Intradepartmental consultation (Dr. Vic Ripper).   09/13/2018 Initial Diagnosis   Cholangiocarcinoma ([place])   09/23/2018 Procedure   Colonoscopy by Dr. Havery Moros 09/23/18  IMPRESSION - Two 3 to 4 mm polyps in the ascending colon, removed with a cold snare. Resected and retrieved. - Five 3 to 5 mm polyps in the transverse colon, removed with a cold snare. Resected and retrieved. - One 5 mm polyp at the splenic flexure, removed with a cold snare. Resected and retrieved. - Three 3 to 5 mm polyps in the sigmoid colon, removed with a cold snare. Resected and retrieved. - The examination was otherwise normal. Upper Endopscy by Dr. Havery Moros 09/23/18  IMPRESSION - Esophagogastric landmarks identified. - 2 cm hiatal hernia. - Normal esophagus otherwise. - A single gastric polyp. Resected and retrieved. - Mild gastritis. Biopsied. - Normal duodenal bulb and second portion of the duodenum.   09/23/2018 Pathology Results   Diagnosis 09/23/18 1. Surgical [P], duodenum - BENIGN SMALL BOWEL MUCOSA. - NO ACTIVE INFLAMMATION OR VILLOUS ATROPHY IDENTIFIED. 2.  Surgical [P], stomach, polyp - HYPERPLASTIC POLYP(S). - THERE IS NO EVIDENCE OF MALIGNANCY. 3. Surgical [P], gastric antrum and gastric  body - CHRONIC INACTIVE GASTRITIS. - THERE IS NO EVIDENCE OF HELICOBACTER-PYLORI, DYSPLASIA, OR MALIGNANCY. - SEE COMMENT. 4. Surgical [P], colon, sigmoid, splenic flexure, transverse and ascending, polyp (9) - TUBULAR ADENOMA(S). - SESSILE SERRATED POLYP WITHOUT CYTOLOGIC DYSPLASIA. - HIGH GRADE DYSPLASIA IS NOT IDENTIFIED. 5. Surgical [P], colon, sigmoid, polyp (2) - HYPERPLASTIC POLYP(S). - THERE IS NO EVIDENCE OF MALIGNANCY.   09/23/2018 Cancer Staging   Staging form: Intrahepatic Bile Duct, AJCC 8th Edition - Clinical stage from 09/23/2018: Stage IB (cT1b, cN0, cM0) - Signed by Dustan Hyams, MD on 10/06/2018   09/29/2018 PET scan   PET 09/29/18 IMPRESSION: 1. Hypermetabolic mass in the RIGHT hepatic lobe consistent with biopsy proven adenocarcinoma. No additional liver metastasis. 2. No evidence of local breast cancer recurrence in the RIGHT breast or RIGHT axilla. 3. Mild bilateral hypermetabolic adrenal glands is favored benign hyperplasia. 4. No evidence of additional metastatic disease on skull base to thigh FDG PET scan.   11/06/2018 Genetic Testing   Negative genetic testing on the [employer] Common Hereditary Cancers panel. A variant of uncertain significance was identified in one of her APC genes, called c.1243G>A (p.Ala415Thr).  The Common Hereditary Cancers Panel offered by [facility] includes sequencing and/or deletion duplication testing of the following 48 genes: APC, ATM, AXIN2, BARD1, BMPR1A, BRCA1, BRCA2, BRIP1, CDH1, CDK4, CDKN2A (p14ARF), CDKN2A (p16INK4a), CHEK2, CTNNA1, DICER1, EPCAM (Deletion/duplication testing only), GREM1 (promoter region deletion/duplication testing only), KIT, MEN1, MLH1, MSH2, MSH3, MSH6, MUTYH, NBN, NF1, NHTL1, PALB2, PDGFRA, PMS2, POLD1, POLE, PTEN, RAD50, RAD51C, RAD51D, RNF43, SDHB, SDHC, SDHD, SMAD4, SMARCA4.  STK11, TP53, TSC1, TSC2, and VHL.  The following genes were evaluated for sequence changes only: SDHA and HOXB13 c.251G>A variant only.    11/17/2018 Pathology Results   Diagnosis 11/17/18 1. Soft tissue, biopsy, Diaphragmatic nodules - METASTATIC ADENOCARCINOMA, CONSISTENT WITH PATIENT'S CLINICAL HISTORY OF CHOLANGIOCARCINOMA. SEE NOTE 2. Liver, biopsy, Left - LIVER PARENCHYMA WITH A BENIGN FIBROTIC NODULE - NO EVIDENCE OF MALIGNANCY 3. Stomach, biopsy - BENIGN PAPILLARY MESOTHELIAL HYPERPLASIA - NO EVIDENCE OF MALIGNANCY   11/29/2018 Imaging   CT CAP WO Contrast  IMPRESSION: 1. Dominant liver mass appears grossly stable from 09/29/2018. Additional liver lesions are too small to characterize but were not shown to be hypermetabolic on PET. 2. Mild nodularity of both adrenal glands with associated hypermetabolism on 09/29/2018. Continued attention on follow-up exams is warranted. 3. Small right lower lobe nodules, stable from 09/07/2018. Again, attention on follow-up is recommended. 4. Trace bilateral pleural fluid. 5. Aortic atherosclerosis (ICD10-170.0). Coronary artery calcification. 6. Enlarged pulmonic trunk, indicative of pulmonary arterial hypertension.     11/30/2018 - 06/14/2019 Chemotherapy   First line chemo Oxaliplatin and gemcitabine q2weeks starting 11/30/18. Stopped Oxaliplatin on 05/03/19 due to worsening neuropathy. Added Cisplatin with C13 on 05/17/19 and stopped on C15 06/14/19 due to neuropathy. Reduced to maintenance single agent Gemcitabine on 06/28/19.    01/20/2019 Imaging   CT CAP IMPRESSION: restaging  1. Mild interval increase in size of dominant liver mass involving segment 4 and segment 5. 2. No significant or progressive adrenal nodularity identified to suggest metastatic disease. 3. Unchanged appearance of small right lower lobe and lingular lung nodules, nonspecific.   03/21/2019 PET scan   IMPRESSION: 1. Large right hepatic mass with SUV uptake near  background hepatic activity, dramatic response to therapy, also with decrease in size when compared to the prior study. 2. Signs of prior right mastectomy and axillary dissection as before. 3. Left adrenal activity in no longer above   the level of activity seen in the contralateral, right adrenal gland or in the liver. Attention on follow-up. 4. No new signs of disease.   06/27/2019 PET scan   IMPRESSION: 1. Further decrease in size of a dominant hepatic mass which is non FDG avid. 2. No evidence of metastatic disease. 3. No evidence of left adrenal hypermetabolism or mass. 4. Incidental findings, including: Uterine fibroids. Coronary artery atherosclerosis. Aortic Atherosclerosis (ICD10-I70.0). Pulmonary artery enlargement suggests pulmonary arterial hypertension.   06/28/2019 -  Chemotherapy   Maintenance single agent Gemcitabine q2weeks  starting on 06/28/19 with C16.       CURRENT THERAPY:  Maintenance single agent Gemcitabine q2weeks starting on 06/28/19 with C16.  INTERVAL HISTORY:  CHARISMA CHARLOT is here for a follow up and treatment. She presents to the clinic alone. She notes she is doing okay. She notes no new changes. She notes her neuropathy is stable and still effecting her quality of life. She feels her feet are bloating from this. She notes she only has 1 orthopedic shoe she can wear now. She notes she is overall very frustrated with the effects of her Neuropathy and change in independence. She notes she has not started PT yet and plans to start soon. She is taking Gabapentin '100mg'$  twice a night and once during the day. She notes this makes her drowsy when she gets up in the morning. She is still driving but overall less than baseline. She feels she is still tolerating chemo well.    REVIEW OF SYSTEMS:   Constitutional: Denies fevers, chills or abnormal weight loss Eyes: Denies blurriness of vision Ears, nose, mouth, throat, and face: Denies mucositis or sore  throat Respiratory: Denies cough, dyspnea or wheezes Cardiovascular: Denies palpitation, chest discomfort or lower extremity swelling Gastrointestinal:  Denies nausea, heartburn or change in bowel habits Skin: Denies abnormal skin rashes Lymphatics: Denies new lymphadenopathy or easy bruising Neurological: (+) Stable neuropathy in her hands and feet  Behavioral/Psych: Mood is stable, no new changes  All other systems were reviewed with the patient and are negative.  MEDICAL HISTORY:  Past Medical History:  Diagnosis Date  . Anemia   . Anxiety   . Arthritis   . Breast cancer ([place]) 1992  . Cataract    Bilateral eyes - surgery to remove  . Chronic kidney disease    CKD stage 3  . Complication of anesthesia    "something they use make me itch for a couple of days."  . Depression   . Diabetes mellitus    type 2  . Duodenitis 01/18/2002  . Fainting spell   . Family history of breast cancer   . Family history of prostate cancer   . GERD (gastroesophageal reflux disease)   . Heart murmur    never has caused any problems  . Hiatal hernia 08/08/2008, 01/18/2002  . History of pneumonia    x 2  . Hyperlipidemia   . Hypertension   . Liver cancer ([place]) 08/2018  . Lymphedema 2017   Right arm  . Pneumonia    x 2  . UTI (lower urinary tract infection)     SURGICAL HISTORY: Past Surgical History:  Procedure Laterality Date  . AXILLARY SURGERY     cyst removal, right  . COLONOSCOPY  09/23/2018   Dr. Havery Moros - polyps  . EYE SURGERY Bilateral    cataracts to remove  . LAPAROSCOPY N/A 11/17/2018   Procedure: LAPAROSCOPY DIAGNOSTIC, INTRAOPERATIVE ULTRASOUND, PERITONEAL BIOPSIES;  Surgeon: [name],  MD;  Location: [facility];  Service: General;  Laterality: N/A;  GENERAL AND EPIDURAL  . LIVER BIOPSY  08/2018   Dr. Lindwood Coke  . MASTECTOMY  1992   right, with flap  . NM MYOCAR PERF WALL MOTION  06/11/2009   Protocol:[place], post stress EF58%, EKG negative for ischemia, low risk  .  PORTACATH PLACEMENT N/A 12/02/2018   Procedure: INSERTION PORT-A-CATH;  Surgeon: [name], MD;  Location: [place];  Service: General;  Laterality: N/A;  . RECONSTRUCTION BREAST W/ TRAM FLAP Right   . TONSILLECTOMY    . TRANSTHORACIC ECHOCARDIOGRAM  12/24/2009   LVEF =>55%, normal study  . UPPER GI ENDOSCOPY      I have reviewed the social history and family history with the patient and they are unchanged from previous note.  ALLERGIES:  has No Known Allergies.  MEDICATIONS:  Current Outpatient Medications  Medication Sig Dispense Refill  . amLODipine (NORVASC) 10 MG tablet TAKE 1 TABLET BY MOUTH EVERY DAY 90 tablet 3  . aspirin 81 MG tablet Take 81 mg by mouth at bedtime.     . Calcium Carbonate-Vitamin D 600-200 MG-UNIT TABS Take 1 tablet by mouth daily.    Marland Kitchen CALCIUM-MAGNESIUM-VITAMIN D PO Take 1 tablet by mouth once a week.     . Cholecalciferol (VITAMIN D3) 2000 UNITS TABS Take 1 tablet by mouth once a week.     . DULoxetine (CYMBALTA) 30 MG capsule Take 1 capsule (30 mg total) by mouth daily. 90 capsule 1  . Echinacea-Goldenseal (ECHINACEA COMB/GOLDEN SEAL) CAPS Take by mouth.    . gabapentin (NEURONTIN) 100 MG capsule Take 1-3 capsules (100-300 mg total) by mouth 3 (three) times daily. Take 1 cap twice daily during day and 3 cap at night 360 capsule 1  . glucose blood test strip 1 each by Other route 2 (two) times daily. Use Onetouch verio test strips as instructed to check blood sugar twice daily. 100 each 2  . hydrochlorothiazide (HYDRODIURIL) 25 MG tablet Take 1 tablet (25 mg total) by mouth daily. 90 tablet 3  . HYDROcodone-acetaminophen (NORCO/VICODIN) 5-325 MG tablet Take 1 tablet by mouth every 6 (six) hours as needed for moderate pain. 10 tablet 0  . ketoconazole (NIZORAL) 2 % cream Apply 1 fingertip amount to each foot daily. 30 g 0  . lansoprazole (PREVACID) 15 MG capsule Take 1 capsule (15 mg total) by mouth daily. 90 capsule 1  . lidocaine-prilocaine (EMLA) cream Apply  to affected area once 90 g 1  . loratadine (CLARITIN) 10 MG tablet Take 10 mg by mouth daily as needed for allergies.    . metFORMIN (GLUCOPHAGE-XR) 500 MG 24 hr tablet Take 1 tablet (500 mg total) by mouth 2 (two) times daily before a meal. 180 tablet 1  . metoprolol succinate (TOPROL-XL) 100 MG 24 hr tablet Take 1 tablet (100 mg total) by mouth daily. Take with or immediately following a meal. 90 tablet 1  . Omega 3 1000 MG CAPS Take 2,000 mg by mouth daily.     . ondansetron (ZOFRAN) 8 MG tablet Take 1 tablet (8 mg total) by mouth every 8 (eight) hours as needed for nausea or vomiting. 30 tablet 0  . Prenatal Vit-Fe Fumarate-FA (MULTIVITAMIN-PRENATAL) 27-0.8 MG TABS tablet Take 1 tablet by mouth daily at 12 noon.    . prochlorperazine (COMPAZINE) 10 MG tablet Take 1 tablet (10 mg total) by mouth every 6 (six) hours as needed for nausea or vomiting. 30 tablet 0  .  raloxifene (EVISTA) 60 MG tablet TAKE 1 TABLET DAILY 90 tablet 0  . rosuvastatin (CRESTOR) 20 MG tablet Take 1 tablet (20 mg total) by mouth daily. 90 tablet 1  . sitaGLIPtin (JANUVIA) 100 MG tablet Take 1 tablet (100 mg total) by mouth daily. 90 tablet 4  . triamcinolone cream (KENALOG) 0.1 % Apply 1 application topically 2 (two) times daily. 30 g 0  . valsartan (DIOVAN) 320 MG tablet Take 1 tablet (320 mg total) by mouth daily. 90 tablet 3   Current Facility-Administered Medications  Medication Dose Route Frequency Provider Last Rate Last Admin  . triamcinolone acetonide (KENALOG) 10 MG/ML injection 10 mg  10 mg Other Once Harriet Masson, DPM       Facility-Administered Medications Ordered in Other Visits  Medication Dose Route Frequency Provider Last Rate Last Admin  . sodium chloride flush (NS) 0.9 % injection 10 mL  10 mL Intracatheter PRN [name], MD   10 mL at 07/26/19 1039    PHYSICAL EXAMINATION: ECOG PERFORMANCE STATUS: 2 - Symptomatic, <50% confined to bed  Vitals:   07/26/19 0828  BP: 134/80  Pulse: 66  Resp:  18  Temp: 98.2 F (36.8 C)  SpO2: 100%   Filed Weights   07/26/19 0828  Weight: 136 lb 6.4 oz (61.9 kg)    Due to COVID19 we will limit examination to appearance. Patient had no complaints.  GENERAL:alert, no distress and comfortable SKIN: skin color normal, no rashes or significant lesions EYES: normal, Conjunctiva are pink and non-injected, sclera clear  NEURO: alert & oriented x 3 with fluent speech   LABORATORY DATA:  I have reviewed the data as listed CBC Latest Ref Rng & Units 07/26/2019 07/12/2019 06/28/2019  WBC 4.0 - 10.5 K/uL 5.2 4.6 6.5  Hemoglobin 12.0 - 15.0 g/dL 8.2(L) 8.3(L) 8.6(L)  Hematocrit 36.0 - 46.0 % 26.6(L) 27.0(L) 27.3(L)  Platelets 150 - 400 K/uL 200 171 189     CMP Latest Ref Rng & Units 07/26/2019 07/12/2019 06/28/2019  Glucose 70 - 99 mg/dL 123(H) 109(H) 87  BUN 8 - 23 mg/dL '20 19 18  '$ Creatinine 0.44 - 1.00 mg/dL 1.40(H) 1.45(H) 1.22(H)  Sodium 135 - 145 mmol/L 143 142 141  Potassium 3.5 - 5.1 mmol/L 3.7 3.7 3.8  Chloride 98 - 111 mmol/L 106 109 108  CO2 22 - 32 mmol/L '26 25 23  '$ Calcium 8.9 - 10.3 mg/dL 8.6(L) 8.9 8.8(L)  Total Protein 6.5 - 8.1 g/dL 6.9 6.7 7.0  Total Bilirubin 0.3 - 1.2 mg/dL 0.4 0.4 0.5  Alkaline Phos 38 - 126 U/L 57 46 54  AST 15 - 41 U/L 26 26 32  ALT 0 - 44 U/L '10 7 11      '$ RADIOGRAPHIC STUDIES: I have personally reviewed the radiological images as listed and agreed with the findings in the report. No results found.   ASSESSMENT & PLAN:  Donna May is a 79 y.o. female with   1.Intrahepatic cholangiocarcinoma, cT1N0M1,with peritoneal metastasis, MSS, IDH1 mutation (+) -She was diagnosed in 08/2018. Her biopsy of her liver mass shows adenocarcinoma,most consistent withcholangiocarcinoma.Her EGD/Colonoscopy from 7/10/20was negativeand 09/29/18 PET scanshowedno evidence of distant metastasis. -She was brought to OR on 9/3,unfortunately she was found to have peritoneal metastasis to right diaphragm and surgery was  aborted. -Given her metastatic cancer, Istarted her onsystemicchemo withFirst line chemoOxaliplatinand gemcitabine q2weeksto control her disease.She started on 11/30/18.Stopped Oxaliplatin on 05/03/19 due to worsening neuropathy.AddedCisplatin with C13 on 05/17/19. Due to progressing neuropathy cisplatin was  stopped on 06/14/19 and she proceeding with single agent Gemcitabine every 2 weeks starting 06/28/19 with C16.  -She continue to tolerate Gemcitabine well and significant neuropathy is stable. Labs reviewed and overall adequate to proceed with C18 Gemcitabine today. Continue every 2 weeks. Will continue to monitor her Neuropathy.  -Patient has been always very concerned about cancer progression when we de-escalate chemotherapy, and strongly wants to continue chemo.  Due to her advanced age, and significant neuropathy, I have changed treatment to maintenance dose gemcitabine every 2 weeks -I discussed if she progresses on chemo or her neuropathy worsens I would switch her next-line oral treatment with IDH inhibitor.  -F/u in 2 weeks before next cycle   2. Anemia -Secondary to chemo  -She previously required blood transfusion on 03/29/19. Will continue prenatal vitamin.  -Moderate and stable.  3. Neuropathy, G2 -secondary to Oxaliplatin and Cisplatin, both currently d/c.  -She still has tingling and numbness of hands and feet. She ambulates with cane and her lack of gripping is effecting her quality of life.  -She is currently on'100mg'$  Gabapentin '200mg'$  nightly and 1 tabs during the day. She likely cannot increase dose due to drowsiness.  -She can continue Cymbalta, take B12 or B complex, exercise and stay warm.  -I discussed her neuropathy will likely not 100% recover but can improve partially slowly over time.  -She is open to home care, I will see who is able to do this under her insurance. She will think about it.  -I previously referred her to PT/OT and she plans to start soon. I  discussed she will likely have to change the way she does things and adapt to this, including less independence.  -She feels she can drive adequately, just overall less and with more caution.  -I also suggest she consult with neurologist Dr Mickeal Skinner for neuropathy evaluation and treatment. I will check her B12 level at next visit.   4. H/o right breast cancer, Geneticsnegative -s/p mastectomy in 1992, per patient did not require adjuvant therapy  -followed by Dr. Jana Hakim -Her genetic testing wasnegativefor pathogeneticmutations. She did have VUS of gene APC.  5.Comorbidities:DM,HTN,hiatal hernia, GERD, renal artery stenosis -f/u with PCP -We will monitor her blood pressure and glucose level closely during her chemo. We will adjust her meds as needed -She did not tolerate Glucerna, she will continue low sugar protein shakes.  6. Family support  -She has a son and daughterwho are often busy but help as needed.  -She notes they are aware of her condition. I encouraged her to keep her children updated and ask them for more home support.   7.Goal of care discussion  -The patient understands the goal of care is palliative. -she is full code now  8.Hypertension, uncontrolled -She is on amlodipine and HTCZ, metoprolol and losartan. I encouraged her to make sure she takes daily and f/u with PCP for possible dose adjustment given recent elevated BP in clinic.  9. Taste change, weight loss  -With recent taste change and loss of taste she has lost 5-6 pounds over 2 weeks.  -She does not try to add more salt or sugar in food given her DM and HTN.  -I discussed if she is eating less she should take more nutritional supplements daily such as Glucerna 2 bottles a day. -She will continue to f/u with Dietician.Weight is stable now.   10. Elevated Cr  -I strongly encouraged her to drink more water at least 40 ounces daily to maintain kidney  function. -Was improving but has  increased recently. Continue to monitor.    PLAN: -I refilled Gabapentin and Cymbalta today, will slightly increase her gabapentin dose -Labs reviewed and adequate to proceed with C18 Gemcitabine today -Lab, flush, f/uandGemcitabine in 2, 4, 6 weeks  -Refer to Dr Mickeal Skinner for peripheral neuropathy evaluation in the treatment -With patient's permission, I called her daughter Abigail Butts and spoke to her about her current condition, overall prognosis, and the potential issues when she has disease progression etc. She voiced that she is aware of her current condition, and appreciated the call    No problem-specific Assessment & Plan notes found for this encounter.   Orders Placed This Encounter  Procedures  . CBC with Differential (Cancer Center Only)    Standing Status:   Standing    Number of Occurrences:   100    Standing Expiration Date:   07/25/2024  . CMP ([place] only)    Standing Status:   Standing    Number of Occurrences:   100    Standing Expiration Date:   07/25/2024  . Methylmalonic acid, serum    Standing Status:   Future    Standing Expiration Date:   07/25/2020   All questions were answered. The patient knows to call the clinic with any problems, questions or concerns. No barriers to learning was detected. The total time spent in the appointment was 30 minutes.     [name], MD 07/26/2019   I, Joslyn Devon, am acting as scribe for [name], MD.   I have reviewed the above documentation for accuracy and completeness, and I agree with the above.

## 2019-07-26 ENCOUNTER — Inpatient Hospital Stay (HOSPITAL_BASED_OUTPATIENT_CLINIC_OR_DEPARTMENT_OTHER): Payer: Medicare Other | Admitting: Hematology

## 2019-07-26 ENCOUNTER — Encounter: Payer: Self-pay | Admitting: Hematology

## 2019-07-26 ENCOUNTER — Inpatient Hospital Stay: Payer: Medicare Other

## 2019-07-26 ENCOUNTER — Other Ambulatory Visit: Payer: Self-pay

## 2019-07-26 ENCOUNTER — Inpatient Hospital Stay: Payer: Medicare Other | Admitting: Nutrition

## 2019-07-26 ENCOUNTER — Inpatient Hospital Stay: Payer: Medicare Other | Attending: Adult Health

## 2019-07-26 ENCOUNTER — Other Ambulatory Visit: Payer: Self-pay | Admitting: *Deleted

## 2019-07-26 VITALS — BP 134/80 | HR 66 | Temp 98.2°F | Resp 18 | Ht 65.0 in | Wt 136.4 lb

## 2019-07-26 DIAGNOSIS — Z5111 Encounter for antineoplastic chemotherapy: Secondary | ICD-10-CM | POA: Insufficient documentation

## 2019-07-26 DIAGNOSIS — G629 Polyneuropathy, unspecified: Secondary | ICD-10-CM

## 2019-07-26 DIAGNOSIS — C786 Secondary malignant neoplasm of retroperitoneum and peritoneum: Secondary | ICD-10-CM | POA: Insufficient documentation

## 2019-07-26 DIAGNOSIS — C9000 Multiple myeloma not having achieved remission: Secondary | ICD-10-CM

## 2019-07-26 DIAGNOSIS — C50919 Malignant neoplasm of unspecified site of unspecified female breast: Secondary | ICD-10-CM | POA: Insufficient documentation

## 2019-07-26 DIAGNOSIS — C221 Intrahepatic bile duct carcinoma: Secondary | ICD-10-CM

## 2019-07-26 DIAGNOSIS — D6481 Anemia due to antineoplastic chemotherapy: Secondary | ICD-10-CM | POA: Diagnosis not present

## 2019-07-26 DIAGNOSIS — Z95828 Presence of other vascular implants and grafts: Secondary | ICD-10-CM

## 2019-07-26 LAB — CMP (CANCER CENTER ONLY)
ALT: 10 U/L (ref 0–44)
AST: 26 U/L (ref 15–41)
Albumin: 3.3 g/dL — ABNORMAL LOW (ref 3.5–5.0)
Alkaline Phosphatase: 57 U/L (ref 38–126)
Anion gap: 11 (ref 5–15)
BUN: 20 mg/dL (ref 8–23)
CO2: 26 mmol/L (ref 22–32)
Calcium: 8.6 mg/dL — ABNORMAL LOW (ref 8.9–10.3)
Chloride: 106 mmol/L (ref 98–111)
Creatinine: 1.4 mg/dL — ABNORMAL HIGH (ref 0.44–1.00)
GFR, Est AFR Am: 41 mL/min — ABNORMAL LOW (ref >60)
GFR, Estimated: 35 mL/min — ABNORMAL LOW (ref >60)
Glucose, Bld: 123 mg/dL — ABNORMAL HIGH (ref 70–99)
Potassium: 3.7 mmol/L (ref 3.5–5.1)
Sodium: 143 mmol/L (ref 135–145)
Total Bilirubin: 0.4 mg/dL (ref 0.3–1.2)
Total Protein: 6.9 g/dL (ref 6.5–8.1)

## 2019-07-26 LAB — CBC WITH DIFFERENTIAL (CANCER CENTER ONLY)
Abs Immature Granulocytes: 0.02 10*3/uL (ref 0.00–0.07)
Basophils Absolute: 0 10*3/uL (ref 0.0–0.1)
Basophils Relative: 0 %
Eosinophils Absolute: 0.2 10*3/uL (ref 0.0–0.5)
Eosinophils Relative: 3 %
HCT: 26.6 % — ABNORMAL LOW (ref 36.0–46.0)
Hemoglobin: 8.2 g/dL — ABNORMAL LOW (ref 12.0–15.0)
Immature Granulocytes: 0 %
Lymphocytes Relative: 22 %
Lymphs Abs: 1.1 10*3/uL (ref 0.7–4.0)
MCH: 31.8 pg (ref 26.0–34.0)
MCHC: 30.8 g/dL (ref 30.0–36.0)
MCV: 103.1 fL — ABNORMAL HIGH (ref 80.0–100.0)
Monocytes Absolute: 1.2 10*3/uL — ABNORMAL HIGH (ref 0.1–1.0)
Monocytes Relative: 24 %
Neutro Abs: 2.7 10*3/uL (ref 1.7–7.7)
Neutrophils Relative %: 51 %
Platelet Count: 200 10*3/uL (ref 150–400)
RBC: 2.58 MIL/uL — ABNORMAL LOW (ref 3.87–5.11)
RDW: 20.6 % — ABNORMAL HIGH (ref 11.5–15.5)
WBC Count: 5.2 10*3/uL (ref 4.0–10.5)
nRBC: 0 % (ref 0.0–0.2)

## 2019-07-26 LAB — MAGNESIUM: Magnesium: 2 mg/dL (ref 1.7–2.4)

## 2019-07-26 MED ORDER — PROCHLORPERAZINE MALEATE 10 MG PO TABS
10.0000 mg | ORAL_TABLET | Freq: Once | ORAL | Status: AC
  Administered 2019-07-26: 10 mg via ORAL

## 2019-07-26 MED ORDER — SODIUM CHLORIDE 0.9 % IV SOLN
Freq: Once | INTRAVENOUS | Status: AC
  Filled 2019-07-26: qty 250

## 2019-07-26 MED ORDER — SODIUM CHLORIDE 0.9% FLUSH
10.0000 mL | INTRAVENOUS | Status: DC | PRN
  Administered 2019-07-26: 10 mL
  Filled 2019-07-26: qty 10

## 2019-07-26 MED ORDER — HEPARIN SOD (PORK) LOCK FLUSH 100 UNIT/ML IV SOLN
500.0000 [IU] | Freq: Once | INTRAVENOUS | Status: AC | PRN
  Administered 2019-07-26: 500 [IU]
  Filled 2019-07-26: qty 5

## 2019-07-26 MED ORDER — PROCHLORPERAZINE MALEATE 10 MG PO TABS
ORAL_TABLET | ORAL | Status: AC
  Filled 2019-07-26: qty 1

## 2019-07-26 MED ORDER — SODIUM CHLORIDE 0.9% FLUSH
10.0000 mL | Freq: Once | INTRAVENOUS | Status: AC
  Administered 2019-07-26: 10 mL
  Filled 2019-07-26: qty 10

## 2019-07-26 MED ORDER — SODIUM CHLORIDE 0.9 % IV SOLN
1000.0000 mg/m2 | Freq: Once | INTRAVENOUS | Status: AC
  Administered 2019-07-26: 1748 mg via INTRAVENOUS
  Filled 2019-07-26: qty 45.97

## 2019-07-26 NOTE — Progress Notes (Signed)
Nutrition Follow-up:  Patient with metastatic cholangiocarcinoma.  Patient receiving gemcitabine q 2 weeks.    Met with patient during infusion for nutrition follow-up.  Patient reports that she did try ensure enlive and was able to drink them.  Still has some glucerna shakes left that she needs to drink.  Continues to report taste alterations.  Reports that sometimes she does not eat until 2pm or after.  Sleeps late in the morning.    Medications: reviewed  Labs: reviewed  Anthropometrics:   Weight stable at 136 lb 4 oz today 136 lb 3 oz on 4/28  NUTRITION DIAGNOSIS: Inadequate oral intake continues    INTERVENTION:  Encouraged ensure enlive as provides more calories and protein. Encouraged eating or drinking shake within 2 hours of waking. Discussed foods that she thinks she would be able to eat (chicken salad, tuna salad) Contact information provided    MONITORING, EVALUATION, GOAL: Patient will tolerate increased calories and protein to maintain weight   NEXT VISIT: May 26 (Wednesday) during infusion  Donna May, [place], [place] Registered Dietitian [phone] (pager)

## 2019-07-26 NOTE — Patient Instructions (Signed)
[facility] Discharge Instructions for Patients Receiving Chemotherapy  Today you received the following chemotherapy agents Gemcitabine (GEMZAR).  To help prevent nausea and vomiting after your treatment, we encourage you to take your nausea medication as prescribed.   If you develop nausea and vomiting that is not controlled by your nausea medication, call the clinic.   BELOW ARE SYMPTOMS THAT SHOULD BE REPORTED IMMEDIATELY:  *FEVER GREATER THAN 100.5 F  *CHILLS WITH OR WITHOUT FEVER  NAUSEA AND VOMITING THAT IS NOT CONTROLLED WITH YOUR NAUSEA MEDICATION  *UNUSUAL SHORTNESS OF BREATH  *UNUSUAL BRUISING OR BLEEDING  TENDERNESS IN MOUTH AND THROAT WITH OR WITHOUT PRESENCE OF ULCERS  *URINARY PROBLEMS  *BOWEL PROBLEMS  UNUSUAL RASH Items with * indicate a potential emergency and should be followed up as soon as possible.  Feel free to call the clinic should you have any questions or concerns. The clinic phone number is [phone].  Please show the CHEMO ALERT CARD at check-in to the Emergency Department and triage nurse.   

## 2019-07-27 ENCOUNTER — Telehealth: Payer: Self-pay | Admitting: Hematology

## 2019-07-27 NOTE — Telephone Encounter (Signed)
Scheduled appt per 5/12 los.  Pt will get an updated appt calendar at her next appt.

## 2019-08-03 NOTE — Progress Notes (Signed)
Pharmacist Chemotherapy Monitoring - Follow Up Assessment    I verify that I have reviewed each item in the below checklist:  . Regimen for the patient is scheduled for the appropriate day and plan matches scheduled date. Marland Kitchen Appropriate non-routine labs are ordered dependent on drug ordered. . If applicable, additional medications reviewed and ordered per protocol based on lifetime cumulative doses and/or treatment regimen.   Plan for follow-up and/or issues identified: No . I-vent associated with next due treatment: No . MD and/or nursing notified: No  Donna May K 08/03/2019 10:18 AM

## 2019-08-07 ENCOUNTER — Other Ambulatory Visit: Payer: Self-pay

## 2019-08-07 ENCOUNTER — Ambulatory Visit: Payer: Medicare Other

## 2019-08-07 DIAGNOSIS — R209 Unspecified disturbances of skin sensation: Secondary | ICD-10-CM | POA: Diagnosis not present

## 2019-08-07 DIAGNOSIS — R262 Difficulty in walking, not elsewhere classified: Secondary | ICD-10-CM

## 2019-08-07 DIAGNOSIS — M6281 Muscle weakness (generalized): Secondary | ICD-10-CM

## 2019-08-07 NOTE — Progress Notes (Signed)
Donna May   [phone] [phone]   Clinic Follow up Note   Patient Care Team: [name], MD as PCP - General (Family Medicine) [name], [name], MD as Consulting Physician (Cardiology) [name], MD as Consulting Physician (Obstetrics and Gynecology) [name], [name], NP as Nurse Practitioner (Hematology and Oncology) [facility] [name], MD as Consulting Physician (Hematology) [name], [name], MD as Consulting Physician (Gastroenterology) Virgina Evener, Dawn, RN (Inactive) as Oncology Nurse Navigator [name], MD as Consulting Physician (General Surgery)  Date of Service:  08/09/2019  CHIEF COMPLAINT: F/u ofcholangiocarcinomaof liver  SUMMARY OF ONCOLOGIC HISTORY: Oncology History  Malignant neoplasm of female breast ([place])  11/06/2018 Genetic Testing   Negative genetic testing on the [facility] Common Hereditary Cancers panel. A variant of uncertain significance was identified in one of her APC genes, called c.1243G>A (p.Ala415Thr).  The Common Hereditary Cancers Panel offered by [facility] includes sequencing and/or deletion duplication testing of the following 48 genes: APC, ATM, AXIN2, BARD1, BMPR1A, BRCA1, BRCA2, BRIP1, CDH1, CDK4, CDKN2A (p14ARF), CDKN2A (p16INK4a), CHEK2, CTNNA1, DICER1, EPCAM (Deletion/duplication testing only), GREM1 (promoter region deletion/duplication testing only), KIT, MEN1, MLH1, MSH2, MSH3, MSH6, MUTYH, NBN, NF1, NHTL1, PALB2, PDGFRA, PMS2, POLD1, POLE, PTEN, RAD50, RAD51C, RAD51D, RNF43, SDHB, SDHC, SDHD, SMAD4, SMARCA4. STK11, TP53, TSC1, TSC2, and VHL.  The following genes were evaluated for sequence changes only: SDHA and HOXB13 c.251G>A variant only.    Intrahepatic cholangiocarcinoma ([place])  09/07/2018 Imaging   CT Chest IMPRESSION: 1. New, enhancing mass involving segment 4 of the liver and fundus of gallbladder is concerning for malignancy. This may represent either  metastatic disease from breast cancer or neoplasm primary to the liver or hepatic biliary tree. Further evaluation with contrast enhanced CT of the abdomen and pelvis is recommended. 2. No findings to suggest metastatic disease within the chest. 3.  Aortic Atherosclerosis (ICD10-I70.0). 4. Coronary artery calcifications.   09/13/2018 Pathology Results   Diagnosis Liver, needle/core biopsy - ADENOCARCINOMA. Microscopic Comment Immunohistochemistry for CK7 is positive. CK20, TTF1, CDX-2, GATA-3, PAX 8, Qualitative ER, p63 and CK5/6 are negative. The provided clinical history of remote mammary carcinoma is noted. Based on the morphology and immunophenotype of the adenocarcinoma observed in this specimen, primary cholangiocarcinoma is favored. Clinical and radiologic correlation are  encouraged. Results reported to [employer] on 09/15/2018. Intradepartmental consultation (Dr. Vic Ripper).   09/13/2018 Initial Diagnosis   Cholangiocarcinoma ([place])   09/23/2018 Procedure   Colonoscopy by Dr. Havery Moros 09/23/18  IMPRESSION - Two 3 to 4 mm polyps in the ascending colon, removed with a cold snare. Resected and retrieved. - Five 3 to 5 mm polyps in the transverse colon, removed with a cold snare. Resected and retrieved. - One 5 mm polyp at the splenic flexure, removed with a cold snare. Resected and retrieved. - Three 3 to 5 mm polyps in the sigmoid colon, removed with a cold snare. Resected and retrieved. - The examination was otherwise normal. Upper Endopscy by Dr. Havery Moros 09/23/18  IMPRESSION - Esophagogastric landmarks identified. - 2 cm hiatal hernia. - Normal esophagus otherwise. - A single gastric polyp. Resected and retrieved. - Mild gastritis. Biopsied. - Normal duodenal bulb and second portion of the duodenum.   09/23/2018 Pathology Results   Diagnosis 09/23/18 1. Surgical [P], duodenum - BENIGN SMALL BOWEL MUCOSA. - NO ACTIVE INFLAMMATION OR VILLOUS ATROPHY IDENTIFIED. 2.  Surgical [P], stomach, polyp - HYPERPLASTIC POLYP(S). - THERE IS NO EVIDENCE OF MALIGNANCY. 3. Surgical [P], gastric antrum and gastric  body - CHRONIC INACTIVE GASTRITIS. - THERE IS NO EVIDENCE OF HELICOBACTER-PYLORI, DYSPLASIA, OR MALIGNANCY. - SEE COMMENT. 4. Surgical [P], colon, sigmoid, splenic flexure, transverse and ascending, polyp (9) - TUBULAR ADENOMA(S). - SESSILE SERRATED POLYP WITHOUT CYTOLOGIC DYSPLASIA. - HIGH GRADE DYSPLASIA IS NOT IDENTIFIED. 5. Surgical [P], colon, sigmoid, polyp (2) - HYPERPLASTIC POLYP(S). - THERE IS NO EVIDENCE OF MALIGNANCY.   09/23/2018 Cancer Staging   Staging form: Intrahepatic Bile Duct, AJCC 8th Edition - Clinical stage from 09/23/2018: Stage IB (cT1b, cN0, cM0) - Signed by [name], MD on 10/06/2018   09/29/2018 PET scan   PET 09/29/18 IMPRESSION: 1. Hypermetabolic mass in the RIGHT hepatic lobe consistent with biopsy proven adenocarcinoma. No additional liver metastasis. 2. No evidence of local breast cancer recurrence in the RIGHT breast or RIGHT axilla. 3. Mild bilateral hypermetabolic adrenal glands is favored benign hyperplasia. 4. No evidence of additional metastatic disease on skull base to thigh FDG PET scan.   11/06/2018 Genetic Testing   Negative genetic testing on the [facility] Common Hereditary Cancers panel. A variant of uncertain significance was identified in one of her APC genes, called c.1243G>A (p.Ala415Thr).  The Common Hereditary Cancers Panel offered by [facility] includes sequencing and/or deletion duplication testing of the following 48 genes: APC, ATM, AXIN2, BARD1, BMPR1A, BRCA1, BRCA2, BRIP1, CDH1, CDK4, CDKN2A (p14ARF), CDKN2A (p16INK4a), CHEK2, CTNNA1, DICER1, EPCAM (Deletion/duplication testing only), GREM1 (promoter region deletion/duplication testing only), KIT, MEN1, MLH1, MSH2, MSH3, MSH6, MUTYH, NBN, NF1, NHTL1, PALB2, PDGFRA, PMS2, POLD1, POLE, PTEN, RAD50, RAD51C, RAD51D, RNF43, SDHB, SDHC, SDHD, SMAD4, SMARCA4.  STK11, TP53, TSC1, TSC2, and VHL.  The following genes were evaluated for sequence changes only: SDHA and HOXB13 c.251G>A variant only.    11/17/2018 Pathology Results   Diagnosis 11/17/18 1. Soft tissue, biopsy, Diaphragmatic nodules - METASTATIC ADENOCARCINOMA, CONSISTENT WITH PATIENT'S CLINICAL HISTORY OF CHOLANGIOCARCINOMA. SEE NOTE 2. Liver, biopsy, Left - LIVER PARENCHYMA WITH A BENIGN FIBROTIC NODULE - NO EVIDENCE OF MALIGNANCY 3. Stomach, biopsy - BENIGN PAPILLARY MESOTHELIAL HYPERPLASIA - NO EVIDENCE OF MALIGNANCY   11/29/2018 Imaging   CT CAP WO Contrast  IMPRESSION: 1. Dominant liver mass appears grossly stable from 09/29/2018. Additional liver lesions are too small to characterize but were not shown to be hypermetabolic on PET. 2. Mild nodularity of both adrenal glands with associated hypermetabolism on 09/29/2018. Continued attention on follow-up exams is warranted. 3. Small right lower lobe nodules, stable from 09/07/2018. Again, attention on follow-up is recommended. 4. Trace bilateral pleural fluid. 5. Aortic atherosclerosis (ICD10-170.0). Coronary artery calcification. 6. Enlarged pulmonic trunk, indicative of pulmonary arterial hypertension.     11/30/2018 - 06/14/2019 Chemotherapy   First line chemo Oxaliplatin and gemcitabine q2weeks starting 11/30/18. Stopped Oxaliplatin on 05/03/19 due to worsening neuropathy. Added Cisplatin with C13 on 05/17/19 and stopped on C15 06/14/19 due to neuropathy. Reduced to maintenance single agent Gemcitabine on 06/28/19.    01/20/2019 Imaging   CT CAP IMPRESSION: restaging  1. Mild interval increase in size of dominant liver mass involving segment 4 and segment 5. 2. No significant or progressive adrenal nodularity identified to suggest metastatic disease. 3. Unchanged appearance of small right lower lobe and lingular lung nodules, nonspecific.   03/21/2019 PET scan   IMPRESSION: 1. Large right hepatic mass with SUV uptake near  background hepatic activity, dramatic response to therapy, also with decrease in size when compared to the prior study. 2. Signs of prior right mastectomy and axillary dissection as before. 3. Left adrenal activity in no longer above  the level of activity seen in the contralateral, right adrenal gland or in the liver. Attention on follow-up. 4. No new signs of disease.   06/27/2019 PET scan   IMPRESSION: 1. Further decrease in size of a dominant hepatic mass which is non FDG avid. 2. No evidence of metastatic disease. 3. No evidence of left adrenal hypermetabolism or mass. 4. Incidental findings, including: Uterine fibroids. Coronary artery atherosclerosis. Aortic Atherosclerosis (ICD10-I70.0). Pulmonary artery enlargement suggests pulmonary arterial hypertension.   06/28/2019 -  Chemotherapy   Maintenance single agent Gemcitabine q2weeks  starting on 06/28/19 with C16.       CURRENT THERAPY:  Maintenance single agent Gemcitabine q2weeks starting on 06/28/19 with C16.  INTERVAL HISTORY:  Donna May is here for a follow up and treatment. She presents to the clinic alone. She notes her neuropathy is stable. She has started PT and only done 2 sessions so far. She has not felt any change so far. She notes she is not sure if Dr. Renda Rolls office has reached her. She notes her last cycle chemo went well and stable. She notes the impact of neuropathy in her life again. She notes she does not want to stop chemo, because it is keeping her alive. She notes gum bleeding when she wakes up in the morning.     REVIEW OF SYSTEMS:   Constitutional: Denies fevers, chills or abnormal weight loss Eyes: Denies blurriness of vision Ears, nose, mouth, throat, and face: Denies mucositis or sore throat Respiratory: Denies cough, dyspnea or wheezes Cardiovascular: Denies palpitation, chest discomfort or lower extremity swelling Gastrointestinal:  Denies nausea, heartburn or change in bowel habits  Skin: Denies abnormal skin rashes Lymphatics: Denies new lymphadenopathy or easy bruising Neurological (+) Stable neuropathy  Behavioral/Psych: Mood is stable, no new changes  All other systems were reviewed with the patient and are negative.  MEDICAL HISTORY:  Past Medical History:  Diagnosis Date  . Anemia   . Anxiety   . Arthritis   . Breast cancer ([place]) 1992  . Cataract    Bilateral eyes - surgery to remove  . Chronic kidney disease    CKD stage 3  . Complication of anesthesia    "something they use make me itch for a couple of days."  . Depression   . Diabetes mellitus    type 2  . Duodenitis 01/18/2002  . Fainting spell   . Family history of breast cancer   . Family history of prostate cancer   . GERD (gastroesophageal reflux disease)   . Heart murmur    never has caused any problems  . Hiatal hernia 08/08/2008, 01/18/2002  . History of pneumonia    x 2  . Hyperlipidemia   . Hypertension   . Liver cancer ([place]) 08/2018  . Lymphedema 2017   Right arm  . Pneumonia    x 2  . UTI (lower urinary tract infection)     SURGICAL HISTORY: Past Surgical History:  Procedure Laterality Date  . AXILLARY SURGERY     cyst removal, right  . COLONOSCOPY  09/23/2018   Dr. Havery Moros - polyps  . EYE SURGERY Bilateral    cataracts to remove  . LAPAROSCOPY N/A 11/17/2018   Procedure: LAPAROSCOPY DIAGNOSTIC, INTRAOPERATIVE ULTRASOUND, PERITONEAL BIOPSIES;  Surgeon: [name], MD;  Location: [facility];  Service: General;  Laterality: N/A;  GENERAL AND EPIDURAL  . LIVER BIOPSY  08/2018   Dr. Lindwood Coke  . MASTECTOMY  1992   right, with flap  .  NM MYOCAR PERF WALL MOTION  06/11/2009   Protocol:[place], post stress EF58%, EKG negative for ischemia, low risk  . PORTACATH PLACEMENT N/A 12/02/2018   Procedure: INSERTION PORT-A-CATH;  Surgeon: [name], MD;  Location: [facility];  Service: General;  Laterality: N/A;  . RECONSTRUCTION BREAST W/ TRAM FLAP Right   . TONSILLECTOMY    .  TRANSTHORACIC ECHOCARDIOGRAM  12/24/2009   LVEF =>55%, normal study  . UPPER GI ENDOSCOPY      I have reviewed the social history and family history with the patient and they are unchanged from previous note.  ALLERGIES:  has No Known Allergies.  MEDICATIONS:  Current Outpatient Medications  Medication Sig Dispense Refill  . amLODipine (NORVASC) 10 MG tablet TAKE 1 TABLET BY MOUTH EVERY DAY 90 tablet 3  . aspirin 81 MG tablet Take 81 mg by mouth at bedtime.     . Calcium Carbonate-Vitamin D 600-200 MG-UNIT TABS Take 1 tablet by mouth daily.    Marland Kitchen CALCIUM-MAGNESIUM-VITAMIN D PO Take 1 tablet by mouth once a week.     . Cholecalciferol (VITAMIN D3) 2000 UNITS TABS Take 1 tablet by mouth once a week.     . DULoxetine (CYMBALTA) 30 MG capsule Take 1 capsule (30 mg total) by mouth daily. 90 capsule 1  . Echinacea-Goldenseal (ECHINACEA COMB/GOLDEN SEAL) CAPS Take by mouth.    . gabapentin (NEURONTIN) 100 MG capsule Take 1-3 capsules (100-300 mg total) by mouth 3 (three) times daily. Take 1 cap twice daily during day and 3 cap at night 360 capsule 1  . glucose blood test strip 1 each by Other route 2 (two) times daily. Use Onetouch verio test strips as instructed to check blood sugar twice daily. 100 each 2  . hydrochlorothiazide (HYDRODIURIL) 25 MG tablet Take 1 tablet (25 mg total) by mouth daily. 90 tablet 3  . HYDROcodone-acetaminophen (NORCO/VICODIN) 5-325 MG tablet Take 1 tablet by mouth every 6 (six) hours as needed for moderate pain. 10 tablet 0  . ketoconazole (NIZORAL) 2 % cream Apply 1 fingertip amount to each foot daily. 30 g 0  . lansoprazole (PREVACID) 15 MG capsule Take 1 capsule (15 mg total) by mouth daily. 90 capsule 1  . lidocaine-prilocaine (EMLA) cream Apply to affected area once 90 g 1  . loratadine (CLARITIN) 10 MG tablet Take 10 mg by mouth daily as needed for allergies.    . metFORMIN (GLUCOPHAGE-XR) 500 MG 24 hr tablet Take 1 tablet (500 mg total) by mouth 2 (two) times  daily before a meal. 180 tablet 1  . metoprolol succinate (TOPROL-XL) 100 MG 24 hr tablet Take 1 tablet (100 mg total) by mouth daily. Take with or immediately following a meal. 90 tablet 1  . Omega 3 1000 MG CAPS Take 2,000 mg by mouth daily.     . ondansetron (ZOFRAN) 8 MG tablet Take 1 tablet (8 mg total) by mouth every 8 (eight) hours as needed for nausea or vomiting. 30 tablet 0  . Prenatal Vit-Fe Fumarate-FA (MULTIVITAMIN-PRENATAL) 27-0.8 MG TABS tablet Take 1 tablet by mouth daily at 12 noon.    . prochlorperazine (COMPAZINE) 10 MG tablet Take 1 tablet (10 mg total) by mouth every 6 (six) hours as needed for nausea or vomiting. 30 tablet 0  . raloxifene (EVISTA) 60 MG tablet TAKE 1 TABLET DAILY 90 tablet 0  . rosuvastatin (CRESTOR) 20 MG tablet Take 1 tablet (20 mg total) by mouth daily. 90 tablet 1  . sitaGLIPtin (JANUVIA) 100 MG  tablet Take 1 tablet (100 mg total) by mouth daily. 90 tablet 4  . triamcinolone cream (KENALOG) 0.1 % Apply 1 application topically 2 (two) times daily. 30 g 0  . valsartan (DIOVAN) 320 MG tablet Take 1 tablet (320 mg total) by mouth daily. 90 tablet 3   Current Facility-Administered Medications  Medication Dose Route Frequency Provider Last Rate Last Admin  . triamcinolone acetonide (KENALOG) 10 MG/ML injection 10 mg  10 mg Other Once Harriet Masson, DPM       Facility-Administered Medications Ordered in Other Visits  Medication Dose Route Frequency Provider Last Rate Last Admin  . sodium chloride flush (NS) 0.9 % injection 10 mL  10 mL Intracatheter PRN [name], MD   10 mL at 08/09/19 1307    PHYSICAL EXAMINATION: ECOG PERFORMANCE STATUS: 2 - Symptomatic, <50% confined to bed  Vitals:   08/09/19 0830  BP: 118/78  Pulse: 65  Resp: 16  Temp: 98.7 F (37.1 C)  SpO2: 100%   Filed Weights   08/09/19 0830  Weight: 61.9 kg    Due to COVID19 we will limit examination to appearance. Patient had no complaints.  GENERAL:alert, no distress and  comfortable SKIN: skin color normal, no rashes or significant lesions EYES: normal, Conjunctiva are pink and non-injected, sclera clear  NEURO: alert & oriented x 3 with fluent speech   LABORATORY DATA:  I have reviewed the data as listed CBC Latest Ref Rng & Units 08/09/2019 07/26/2019 07/12/2019  WBC 4.0 - 10.5 K/uL 5.8 5.2 4.6  Hemoglobin 12.0 - 15.0 g/dL 7.5(L) 8.2(L) 8.3(L)  Hematocrit 36.0 - 46.0 % 23.8(L) 26.6(L) 27.0(L)  Platelets 150 - 400 K/uL 218 200 171     CMP Latest Ref Rng & Units 08/09/2019 07/26/2019 07/12/2019  Glucose 70 - 99 mg/dL 97 123(H) 109(H)  BUN 8 - 23 mg/dL _0 Creatinine 0.44 - 1.00 mg/dL 1.26(H) 1.40(H) 1.45(H)  Sodium 135 - 145 mmol/L 143 143 142  Potassium 3.5 - 5.1 mmol/L 3.8 3.7 3.7  Chloride 98 - 111 mmol/L 108 106 109  CO2 22 - 32 mmol/L _1 Calcium 8.9 - 10.3 mg/dL 8.5(L) 8.6(L) 8.9  Total Protein 6.5 - 8.1 g/dL 6.6 6.9 6.7  Total Bilirubin 0.3 - 1.2 mg/dL 0.3 0.4 0.4  Alkaline Phos 38 - 126 U/L 48 57 46  AST 15 - 41 U/L _2 ALT 0 - 44 U/L _3 RADIOGRAPHIC STUDIES: I have personally reviewed the radiological images as listed and agreed with the findings in the report. No results found.   ASSESSMENT & PLAN:  Donna May is a 79 y.o. female with    1.Intrahepatic cholangiocarcinoma, cT1N0M1,with peritoneal metastasis, MSS, IDH1 mutation (+) -She was diagnosed in 08/2018. Her biopsy of her liver mass shows adenocarcinoma,most consistent withcholangiocarcinoma.Her EGD/Colonoscopy from 7/10/20was negativeand 09/29/18 PET scanshowedno evidence of distant metastasis. -She was brought to OR on 9/3,unfortunately she was found to have peritoneal metastasis to right diaphragm and surgery was aborted. -Given her metastatic cancer, Istarted her onsystemicchemo withFirst line chemoOxaliplatinand gemcitabine q2weeksto control her disease.She started on 11/30/18.Stopped Oxaliplatin on 05/03/19 due to worsening  neuropathy.AddedCisplatin with C13 on 05/17/19.Due to progressing neuropathy cisplatin was stopped on 06/14/19 and she proceeding with single agent Gemcitabineevery 2 weeks starting 06/28/19 with C16.  -She continues to tolerate Gemcitabine well. I discussed continuing as long as it is controlling her disease. I also discussed Gemcitabine usually does not  cause significant neuropathy, but if worsens I suggest switching her to oral chemo.  -Labs reviewed, Hg 7.5. Her MMA and Mag still pending. Overall adequate to proceed with Gemcitabine today.  -F/u in 2 and 4 weeks with APP -I will see her back in 6 weeks with restaging PET scan    2. Anemia -Secondary to chemo -Shepreviouslyrequiredblood transfusionon1/13/21. Will continue prenatal vitamin. -Her Hg further dropped to 7.5 today (08/09/19).Her MMA is still pending today. Although she is not very symptomatic, I recommend blood transfusion this week. She is agreeable.   3. Neuropathy, G3 -secondary to Oxaliplatinand Cisplatin, both currently d/c. -She still has tingling and numbness of hands and feet. She ambulates with cane and her lack of gripping is effecting her quality of life.  -She is currently on173m Gabapentin 2041mnightly and 1 tabs during the day. She likely cannot increase dose due to drowsiness.  -She can continue Cymbalta, take B12 or B complex, exercise and stay warm.  -I discussed her neuropathy will likely not 100% recover but can improve partially slowly over time. -She is open to home care, I will see who is able to do this under her insurance. She will think about it. -She has started PT/OT, she is 2 sessions in so far. I previously discussed she will likely have to change the way she does things and adapt to this, including less independence.  -She feels she can drive adequately, just overall less and with more caution.  -I also suggest she consult with neurologist Dr VaMickeal Skinneror neuropathy evaluation and  treatment. She has not been reached yet.   4. H/o right breast cancer, Geneticsnegative -s/p mastectomy in 1992, per patient did not require adjuvant therapy  -followed by Dr. MaJana HakimHer genetic testing wasnegativefor pathogeneticmutations. She did have VUS of gene APC.  5.Comorbidities:DM,HTN,hiatal hernia, GERD, renal artery stenosis -f/u with PCP -We will monitor her blood pressure and glucose level closely during her chemo. We will adjust her meds as needed -She did not tolerate Glucerna, she will continue low sugar protein shakes.  6. Family support  -She has a son and daughterwho are often busy but help as needed.  -She notes they are aware of her condition. I encouraged her to keep her children updated and ask them for more home support.   7.Goal of care discussion  -The patient understands the goal of care is palliative. -she is full code now  8.Hypertension, uncontrolled -She is on amlodipine and HTCZ, metoprolol and losartan. I encouraged her to make sure she takes daily and f/u with PCP for possible dose adjustment given recent elevated BP in clinic.  9. Taste change, weight loss  -With recent taste change and loss of taste she has lost 5-6 poundsover2 weeks.  -She does not try to add more salt or sugar in food given her DM and HTN.  -I discussed if she is eating less she should take more nutritional supplements daily such as Glucerna 2 bottles a day. -She will continue to f/u with Dietician.Weight is stable now.   10. Elevated Cr  -I strongly encouraged her to drink more water at least 40 ounces daily to maintain kidney function. -Was improving but has increased recently.Continue to monitor.   PLAN: -Blood Transfusion this week  -Labs reviewed and adequate to proceed with Gemcitabine today, will decrease her dose to 800 mg/m due to her worsening -Lab, flush, f/uandGemcitabine in 2, 4, 6 weeks  -Referral to Dr VaMickeal Skinneror her  peripheral neuropathy  No problem-specific Assessment & Plan notes found for this encounter.   Orders Placed This Encounter  Procedures  . NM PET Image Restag (PS) Skull Base To Thigh    Standing Status:   Future    Standing Expiration Date:   08/08/2020    Order Specific Question:   If indicated for the ordered procedure, I authorize the administration of a radiopharmaceutical per Radiology protocol    Answer:   Yes    Order Specific Question:   Preferred imaging location?    Answer:   [facility]    Order Specific Question:   Radiology Contrast Protocol - do NOT remove file path    Answer:   _[email or web address]  . Care order/instruction    Transfuse Parameters    Standing Status:   Future    Standing Expiration Date:   08/08/2020  . Type and screen    Will be drawn in infusion    Standing Status:   Future    Number of Occurrences:   1    Standing Expiration Date:   08/08/2020   All questions were answered. The patient knows to call the clinic with any problems, questions or concerns. No barriers to learning was detected. The total time spent in the appointment was 30 minutes.     [name], MD 08/09/2019   I, Joslyn Devon, am acting as scribe for [name], MD.   I have reviewed the above documentation for accuracy and completeness, and I agree with the above.

## 2019-08-07 NOTE — Therapy (Signed)
[place], [place], [address] Phone: [phone]   Fax:  [phone]  Physical Therapy Treatment  Patient Details  Name: Donna May MRN: [id number] Date of Birth: [birth year] Referring Provider (PT): Dr. Burr Medico    Encounter Date: 08/07/2019  PT End of Session - 08/07/19 1721    Visit Number  2    Number of Visits  17    Date for PT Re-Evaluation  09/18/19    PT Start Time  0350    PT Stop Time  1703    PT Time Calculation (min)  56 min    Activity Tolerance  Patient tolerated treatment well    Behavior During Therapy  [facility] for tasks assessed/performed       Past Medical History:  Diagnosis Date  . Anemia   . Anxiety   . Arthritis   . Breast cancer ([place]) 1992  . Cataract    Bilateral eyes - surgery to remove  . Chronic kidney disease    CKD stage 3  . Complication of anesthesia    "something they use make me itch for a couple of days."  . Depression   . Diabetes mellitus    type 2  . Duodenitis 01/18/2002  . Fainting spell   . Family history of breast cancer   . Family history of prostate cancer   . GERD (gastroesophageal reflux disease)   . Heart murmur    never has caused any problems  . Hiatal hernia 08/08/2008, 01/18/2002  . History of pneumonia    x 2  . Hyperlipidemia   . Hypertension   . Liver cancer ([place]) 08/2018  . Lymphedema 2017   Right arm  . Pneumonia    x 2  . UTI (lower urinary tract infection)     Past Surgical History:  Procedure Laterality Date  . AXILLARY SURGERY     cyst removal, right  . COLONOSCOPY  09/23/2018   Dr. Havery Moros - polyps  . EYE SURGERY Bilateral    cataracts to remove  . LAPAROSCOPY N/A 11/17/2018   Procedure: LAPAROSCOPY DIAGNOSTIC, INTRAOPERATIVE ULTRASOUND, PERITONEAL BIOPSIES;  Surgeon: [name], MD;  Location: [place];  Service: General;  Laterality: N/A;  GENERAL AND EPIDURAL  . LIVER BIOPSY  08/2018   Dr. Lindwood Coke  . MASTECTOMY   1992   right, with flap  . NM MYOCAR PERF WALL MOTION  06/11/2009   Protocol:[place]ruce, post stress EF58%, EKG negative for ischemia, low risk  . PORTACATH PLACEMENT N/A 12/02/2018   Procedure: INSERTION PORT-A-CATH;  Surgeon: [name], MD;  Location: [place];  Service: General;  Laterality: N/A;  . RECONSTRUCTION BREAST W/ TRAM FLAP Right   . TONSILLECTOMY    . TRANSTHORACIC ECHOCARDIOGRAM  12/24/2009   LVEF =>55%, normal study  . UPPER GI ENDOSCOPY      There were no vitals filed for this visit.  Subjective Assessment - 08/07/19 1610    Subjective  My Lt hip was hurting bad last week but my daughter rubbed some BioFreeze on it for me and it's been feeling better. If I could just get rid of this neuropathy I'd be doing okay.    Pertinent History  Pt with Intrahepaticholangiocarcinoma with metastasis.  She was treated with chemotherapy and developed neuropathy in hands and feet. She is on an ongoing maintenance chemo every other week.  Past history includes breast cancer in 1992 with ALND and had problems with lymphedema in 2017.  She says  she has a sleeve but she doesn't wear it  that much History includes chronic kidney disease and diabetes type II    Patient Stated Goals  "to be able to be doing sonething for myself"    Currently in Pain?  No/denies         [facility] PT Assessment - 08/07/19 0001      Strength   Right Ankle Dorsiflexion  4-/5    Right Ankle Plantar Flexion  5/5    Left Ankle Dorsiflexion  4/5    Left Ankle Plantar Flexion  5/5                    [facility] Adult PT Treatment/Exercise - 08/07/19 0001      Neuro Re-ed    Neuro Re-ed Details   Standing at back of bike for +2 HHA (washcloth placed over seat as mesh seat was uncomfortable of her CIPN in hands) throughout on Airex: bil sidestepping on/off x10 each way, then front stepping on/off x10 each; tandem stance eyes open, then eyes closed 1 x 30 sec each, bil tandem stance 1 x 30 sec each; then no AIrex for  bil SLS practice 1 x 30 sec each; then on Rocker board for front to back and then side to side x 1 min each with +2 HHA on bike      Lumbar Exercises: Supine   Pelvic Tilt  Limitations;Other (comment)    Pelvic Tilt Limitations  Pt attempted this multiple times with tactile and VCs and PTA demo but was unable to complete correct tilt. Also had trouble with relaxing upper body when trying tilt, though this did improve some with multiple trials.       Knee/Hip Exercises: Aerobic   Stationary Bike  Level1 x1 min, stopped due to bil thigh fatigue and pt had trouble keeping Rt foot in pedal      Knee/Hip Exercises: Standing   Hip Extension  Stengthening;Right;Left;10 reps   at back of bike for +2 HHA   Extension Limitations  Demo for technique      Knee/Hip Exercises: Supine   Short Arc Quad Sets  Strengthening;Right;Left;10 reps   5 sec holds   Bridges  Strengthening;Both;10 reps    Straight Leg Raises  Strengthening;Right;Left;10 reps    Straight Leg Raises Limitations  Tactile cues for quad set to decrease lag               PT Short Term Goals - 07/18/19 1721      PT SHORT TERM GOAL #1   Title  Pt will increase reptitions of sit to stand in 30 seconds to 8 indicating an improvment in functional strength    Baseline  5 reps in 30 sec on 07/18/219    Time  4    Period  Weeks    Status  New      PT SHORT TERM GOAL #2   Title  Pt will report she has noticed mild improvement in her ability to carry out her tasks at home    Time  4    Period  Weeks    Status  New      PT SHORT TERM GOAL #3   Title  Pt will decrease TUG score to < 10 seconds indicting an improvemet in functional mobility    Baseline  11.49 sec    Time  4    Period  Weeks    Status  New  PT Long Term Goals - 07/18/19 1723      PT LONG TERM GOAL #1   Title  Pt will increase reptitions of sit to stand in 30 seconds to 10 indicating an improvment in functional strength    Baseline  5 on 07/18/2019     Time  8    Period  Weeks    Status  New      PT LONG TERM GOAL #2   Title  Pt will report she has noticed moderate mprovement in her ability to carry out her tasks at home    Time  8    Period  Weeks    Status  New      PT LONG TERM GOAL #3   Title  Pt will decrease TUG score to < 10 seconds indicting an improvemet in functional mobility    Baseline  11.49 on 07/18/2019    Time  8    Period  Weeks    Status  New            Plan - 08/07/19 1721    Clinical Impression Statement  Pt arrived at physical therapy today with [facility] that she was inconsistently using (carried more than used on floor) and she was very unsteady on feet with multiple backward min LOB that she was able to self correct. Encouraged pt to use cane more consistently as she is very unsteady on her feet. Pt was challenged by all exercisees today. She struggles with dissociating upper from lower body when trying to perform supine posterior pelvic tilt, though this did improve some after max cuing and education. She did well with all balance activities though reports being challenged by these and her hands started hurting during session from holding on so added washcloth on back and bike and pt reports this helped. Spent time encouraging pt to slow down and pay attention to where she places her feet with walking/stepping since she can't rely on feeling in feet currently due to CIPN.    Personal Factors and Comorbidities  Comorbidity 3+;Past/Current Experience;Age    Comorbidities  previous breast cancer with lymphedema in her right arm, ongoing chemo. DM    Examination-Activity Limitations  Reach Overhead;Locomotion Level;Stand;Stairs;Squat;Dressing;Bend    Examination-Participation Restrictions  Community Activity;Meal Prep;Cleaning;Shop;Laundry    Stability/Clinical Decision Making  Evolving/Moderate complexity    Rehab Potential  Good    Clinical Impairments Affecting Rehab Potential  history of right axillary lymph node  dissection (in 1992), chemotherapy    PT Frequency  2x / week    PT Duration  8 weeks    PT Treatment/Interventions  ADLs/Self Care Home Management;Therapeutic exercise;DME Instruction;Patient/family education;Manual techniques;Manual lymph drainage;Compression bandaging;Passive range of motion;Taping;Aquatic Therapy;Gait training;Stair training;Balance training;Therapeutic activities;Functional mobility training    PT Next Visit Plan  Cont mat exercise for LE and core strength. progress balance activities as able; add balance HEP    Consulted and Agree with Plan of Care  Patient       Patient will benefit from skilled therapeutic intervention in order to improve the following deficits and impairments:  Abnormal gait, Decreased balance, Decreased endurance, Difficulty walking, Impaired sensation, Impaired perceived functional ability, Increased edema, Decreased knowledge of precautions, Decreased range of motion, Decreased activity tolerance, Decreased strength, Postural dysfunction, Pain  Visit Diagnosis: Difficulty in walking, not elsewhere classified  Muscle weakness (generalized)  Unspecified disturbances of skin sensation     Problem List Patient Active Problem List   Diagnosis Date Noted  . Port-A-Cath in  place 12/14/2018  . Goals of care, counseling/discussion 11/24/2018  . Status post laparoscopy 11/17/2018  . Genetic testing 11/07/2018  . Family history of prostate cancer   . Family history of breast cancer   . Intrahepatic cholangiocarcinoma ([place]) 09/20/2018  . Coronary artery calcification 04/20/2016  . Ductal carcinoma in situ (DCIS) of right breast 11/26/2015  . Lymphedema of arm 11/26/2015  . Lymphedema 11/01/2015  . Leg pain, bilateral 12/28/2013  . Special screening for malignant neoplasms, colon 08/09/2013  . Renal artery stenosis, native, bilateral ([place]) 06/09/2013  . Diabetes mellitus with renal complications ([place]) 33/43/5686  . RASH AND OTHER NONSPECIFIC  SKIN ERUPTION 07/05/2008  . Malignant neoplasm of female breast ([place]) 07/04/2008  . HYPERCHOLESTEROLEMIA 07/04/2008  . Essential hypertension 07/04/2008  . GERD 07/04/2008  . DUODENITIS, WITHOUT HEMORRHAGE 07/04/2008  . HIATAL HERNIA 07/04/2008  . Arthropathy 07/04/2008    Otelia Limes, PTA 08/07/2019, 5:28 PM  [place], [place], [address] Phone: [phone]   Fax:  [phone]  Name: Donna May MRN: [id number] Date of Birth: Jun 26, 1940

## 2019-08-09 ENCOUNTER — Inpatient Hospital Stay: Payer: Medicare Other

## 2019-08-09 ENCOUNTER — Encounter: Payer: Self-pay | Admitting: Hematology

## 2019-08-09 ENCOUNTER — Inpatient Hospital Stay: Payer: Medicare Other | Admitting: Nutrition

## 2019-08-09 ENCOUNTER — Other Ambulatory Visit: Payer: Self-pay

## 2019-08-09 ENCOUNTER — Inpatient Hospital Stay (HOSPITAL_BASED_OUTPATIENT_CLINIC_OR_DEPARTMENT_OTHER): Payer: Medicare Other | Admitting: Hematology

## 2019-08-09 VITALS — BP 152/59 | HR 57 | Temp 98.1°F | Resp 18

## 2019-08-09 VITALS — BP 118/78 | HR 65 | Temp 98.7°F | Resp 16 | Wt 136.4 lb

## 2019-08-09 DIAGNOSIS — N1832 Chronic kidney disease, stage 3b: Secondary | ICD-10-CM

## 2019-08-09 DIAGNOSIS — C221 Intrahepatic bile duct carcinoma: Secondary | ICD-10-CM

## 2019-08-09 DIAGNOSIS — D649 Anemia, unspecified: Secondary | ICD-10-CM

## 2019-08-09 DIAGNOSIS — C50919 Malignant neoplasm of unspecified site of unspecified female breast: Secondary | ICD-10-CM | POA: Diagnosis not present

## 2019-08-09 DIAGNOSIS — Z95828 Presence of other vascular implants and grafts: Secondary | ICD-10-CM

## 2019-08-09 DIAGNOSIS — Z5111 Encounter for antineoplastic chemotherapy: Secondary | ICD-10-CM | POA: Diagnosis not present

## 2019-08-09 DIAGNOSIS — E1121 Type 2 diabetes mellitus with diabetic nephropathy: Secondary | ICD-10-CM | POA: Diagnosis not present

## 2019-08-09 DIAGNOSIS — D6481 Anemia due to antineoplastic chemotherapy: Secondary | ICD-10-CM | POA: Diagnosis not present

## 2019-08-09 DIAGNOSIS — G629 Polyneuropathy, unspecified: Secondary | ICD-10-CM

## 2019-08-09 DIAGNOSIS — C786 Secondary malignant neoplasm of retroperitoneum and peritoneum: Secondary | ICD-10-CM | POA: Diagnosis not present

## 2019-08-09 LAB — CBC WITH DIFFERENTIAL (CANCER CENTER ONLY)
Abs Immature Granulocytes: 0.03 10*3/uL (ref 0.00–0.07)
Basophils Absolute: 0 10*3/uL (ref 0.0–0.1)
Basophils Relative: 0 %
Eosinophils Absolute: 0.1 10*3/uL (ref 0.0–0.5)
Eosinophils Relative: 2 %
HCT: 23.8 % — ABNORMAL LOW (ref 36.0–46.0)
Hemoglobin: 7.5 g/dL — ABNORMAL LOW (ref 12.0–15.0)
Immature Granulocytes: 1 %
Lymphocytes Relative: 21 %
Lymphs Abs: 1.2 10*3/uL (ref 0.7–4.0)
MCH: 32.3 pg (ref 26.0–34.0)
MCHC: 31.5 g/dL (ref 30.0–36.0)
MCV: 102.6 fL — ABNORMAL HIGH (ref 80.0–100.0)
Monocytes Absolute: 1.2 10*3/uL — ABNORMAL HIGH (ref 0.1–1.0)
Monocytes Relative: 20 %
Neutro Abs: 3.2 10*3/uL (ref 1.7–7.7)
Neutrophils Relative %: 56 %
Platelet Count: 218 10*3/uL (ref 150–400)
RBC: 2.32 MIL/uL — ABNORMAL LOW (ref 3.87–5.11)
RDW: 19.2 % — ABNORMAL HIGH (ref 11.5–15.5)
WBC Count: 5.8 10*3/uL (ref 4.0–10.5)
nRBC: 0 % (ref 0.0–0.2)

## 2019-08-09 LAB — CMP (CANCER CENTER ONLY)
ALT: 6 U/L (ref 0–44)
AST: 25 U/L (ref 15–41)
Albumin: 3.2 g/dL — ABNORMAL LOW (ref 3.5–5.0)
Alkaline Phosphatase: 48 U/L (ref 38–126)
Anion gap: 9 (ref 5–15)
BUN: 21 mg/dL (ref 8–23)
CO2: 26 mmol/L (ref 22–32)
Calcium: 8.5 mg/dL — ABNORMAL LOW (ref 8.9–10.3)
Chloride: 108 mmol/L (ref 98–111)
Creatinine: 1.26 mg/dL — ABNORMAL HIGH (ref 0.44–1.00)
GFR, Est AFR Am: 47 mL/min — ABNORMAL LOW (ref >60)
GFR, Estimated: 40 mL/min — ABNORMAL LOW (ref >60)
Glucose, Bld: 97 mg/dL (ref 70–99)
Potassium: 3.8 mmol/L (ref 3.5–5.1)
Sodium: 143 mmol/L (ref 135–145)
Total Bilirubin: 0.3 mg/dL (ref 0.3–1.2)
Total Protein: 6.6 g/dL (ref 6.5–8.1)

## 2019-08-09 LAB — PREPARE RBC (CROSSMATCH)

## 2019-08-09 LAB — MAGNESIUM: Magnesium: 2.1 mg/dL (ref 1.7–2.4)

## 2019-08-09 MED ORDER — HEPARIN SOD (PORK) LOCK FLUSH 100 UNIT/ML IV SOLN
500.0000 [IU] | Freq: Once | INTRAVENOUS | Status: AC | PRN
  Administered 2019-08-09: 500 [IU]
  Filled 2019-08-09: qty 5

## 2019-08-09 MED ORDER — SODIUM CHLORIDE 0.9% FLUSH
10.0000 mL | Freq: Once | INTRAVENOUS | Status: DC
  Filled 2019-08-09: qty 10

## 2019-08-09 MED ORDER — SODIUM CHLORIDE 0.9% FLUSH
10.0000 mL | INTRAVENOUS | Status: DC | PRN
  Administered 2019-08-09: 10 mL
  Filled 2019-08-09: qty 10

## 2019-08-09 MED ORDER — DIPHENHYDRAMINE HCL 25 MG PO CAPS
ORAL_CAPSULE | ORAL | Status: AC
  Filled 2019-08-09: qty 1

## 2019-08-09 MED ORDER — PROCHLORPERAZINE MALEATE 10 MG PO TABS
ORAL_TABLET | ORAL | Status: AC
  Filled 2019-08-09: qty 1

## 2019-08-09 MED ORDER — PROCHLORPERAZINE MALEATE 10 MG PO TABS
10.0000 mg | ORAL_TABLET | Freq: Once | ORAL | Status: AC
  Administered 2019-08-09: 10 mg via ORAL

## 2019-08-09 MED ORDER — SODIUM CHLORIDE 0.9 % IV SOLN
800.0000 mg/m2 | Freq: Once | INTRAVENOUS | Status: AC
  Administered 2019-08-09: 1330 mg via INTRAVENOUS
  Filled 2019-08-09: qty 34.98

## 2019-08-09 MED ORDER — SODIUM CHLORIDE 0.9 % IV SOLN
Freq: Once | INTRAVENOUS | Status: AC
  Filled 2019-08-09: qty 250

## 2019-08-09 MED ORDER — DIPHENHYDRAMINE HCL 25 MG PO CAPS
25.0000 mg | ORAL_CAPSULE | Freq: Once | ORAL | Status: AC
  Administered 2019-08-09: 25 mg via ORAL

## 2019-08-09 NOTE — Progress Notes (Signed)
Nutrition Follow-up:  Patient with metastatic cholangiocarcinoma  Receiving Gemcitabine q 14 days  Met with patient during infusion for nutrition follow-up. Patient reports eating small meals twice daily, recalls having some oatmeal this morning. Patient reports usually not eating until later in the afternoon because she sleeps late. Patient reports that she has been unable to find Ensure Enlive in stores, requested samples. Patient reports recently having diarrhea, attributes that to [employer] dressing she had at [facility]. Patient is no longer having diarrhea, reports having regular BMs every few days. Patient recalls last bowel movement on Sunday or Monday.    Medications: Gabapentin, Metformin, Omega 3, D3,   Labs: BG 97, Cr 1.26, Corrected Ca 9.14, Albumin 3.2  Anthropometrics:   Weight stable at 136 lb 6.4 oz today 136 lb 6.4 oz on 5/12  NUTRITION DIAGNOSIS: Inadequate oral intake continues  INTERVENTION:  Educated on the importance of calories and protein and encouraged small frequent meals throughout the day. Recommended intake of 2 high calorie high protein supplements daily Provided 1 complimentary case of Ensure Enlive    MONITORING, EVALUATION, GOAL:  Patient will tolerate increased calories and protein to maintain weight   NEXT VISIT: To be scheduled as needed  Lajuan Lines, RD, LDN Clinical Nutrition After Hours/Weekend Pager # in [place]

## 2019-08-09 NOTE — Patient Instructions (Signed)
[facility] Discharge Instructions for Patients Receiving Chemotherapy  Today you received the following chemotherapy agents: Gemzar  To help prevent nausea and vomiting after your treatment, we encourage you to take your nausea medication as prescribed.    If you develop nausea and vomiting that is not controlled by your nausea medication, call the clinic.   BELOW ARE SYMPTOMS THAT SHOULD BE REPORTED IMMEDIATELY:  *FEVER GREATER THAN 100.5 F  *CHILLS WITH OR WITHOUT FEVER  NAUSEA AND VOMITING THAT IS NOT CONTROLLED WITH YOUR NAUSEA MEDICATION  *UNUSUAL SHORTNESS OF BREATH  *UNUSUAL BRUISING OR BLEEDING  TENDERNESS IN MOUTH AND THROAT WITH OR WITHOUT PRESENCE OF ULCERS  *URINARY PROBLEMS  *BOWEL PROBLEMS  UNUSUAL RASH Items with * indicate a potential emergency and should be followed up as soon as possible.  Feel free to call the clinic should you have any questions or concerns. The clinic phone number is [phone].  Please show the [facility] at check-in to the Emergency Department and triage nurse.     Blood Transfusion, Adult, Care After This sheet gives you information about how to care for yourself after your procedure. Your doctor may also give you more specific instructions. If you have problems or questions, contact your doctor. What can I expect after the procedure? After the procedure, it is common to have:  Bruising and soreness at the IV site.  A fever or chills on the day of the procedure. This may be your body's response to the new blood cells received.  A headache. Follow these instructions at home: Insertion site care      Follow instructions from your doctor about how to take care of your insertion site. This is where an IV tube was put into your vein. Make sure you: ? Wash your hands with soap and water before and after you change your bandage (dressing). If you cannot use soap and water, use hand sanitizer. ? Change  your bandage as told by your doctor.  Check your insertion site every day for signs of infection. Check for: ? Redness, swelling, or pain. ? Bleeding from the site. ? Warmth. ? Pus or a bad smell. General instructions  Take over-the-counter and prescription medicines only as told by your doctor.  Rest as told by your doctor.  Go back to your normal activities as told by your doctor.  Keep all follow-up visits as told by your doctor. This is important. Contact a doctor if:  You have itching or red, swollen areas of skin (hives).  You feel worried or nervous (anxious).  You feel weak after doing your normal activities.  You have redness, swelling, warmth, or pain around the insertion site.  You have blood coming from the insertion site, and the blood does not stop with pressure.  You have pus or a bad smell coming from the insertion site. Get help right away if:  You have signs of a serious reaction. This may be coming from an allergy or the body's defense system (immune system). Signs include: ? Trouble breathing or shortness of breath. ? Swelling of the face or feeling warm (flushed). ? Fever or chills. ? Head, chest, or back pain. ? Dark pee (urine) or blood in the pee. ? Widespread rash. ? Fast heartbeat. ? Feeling dizzy or light-headed. You may receive your blood transfusion in an outpatient setting. If so, you will be told whom to contact to report any reactions. These symptoms may be an emergency. Do not  wait to see if the symptoms will go away. Get medical help right away. Call your local emergency services (911 in the [place] Do not drive yourself to the hospital. Summary  Bruising and soreness at the IV site are common.  Check your insertion site every day for signs of infection.  Rest as told by your doctor. Go back to your normal activities as told by your doctor.  Get help right away if you have signs of a serious reaction. This information is not intended  to replace advice given to you by your health care provider. Make sure you discuss any questions you have with your health care provider. Document Revised: 08/25/2018 Document Reviewed: 08/25/2018 Elsevier Patient Education  [place].

## 2019-08-10 ENCOUNTER — Ambulatory Visit: Payer: Medicare Other | Admitting: Physical Therapy

## 2019-08-10 ENCOUNTER — Telehealth: Payer: Self-pay | Admitting: Hematology

## 2019-08-10 ENCOUNTER — Encounter: Payer: Self-pay | Admitting: Physical Therapy

## 2019-08-10 DIAGNOSIS — R209 Unspecified disturbances of skin sensation: Secondary | ICD-10-CM

## 2019-08-10 DIAGNOSIS — M6281 Muscle weakness (generalized): Secondary | ICD-10-CM

## 2019-08-10 DIAGNOSIS — R262 Difficulty in walking, not elsewhere classified: Secondary | ICD-10-CM | POA: Diagnosis not present

## 2019-08-10 LAB — BPAM RBC
Blood Product Expiration Date: 202106062359
ISSUE DATE / TIME: 202105261040
Unit Type and Rh: 6200

## 2019-08-10 LAB — TYPE AND SCREEN
ABO/RH(D): A POS
Antibody Screen: NEGATIVE
Unit division: 0

## 2019-08-10 NOTE — Therapy (Signed)
[place], [place], [address] Phone: [phone]   Fax:  [phone]  Physical Therapy Treatment  Patient Details  Name: Donna May MRN: [id number] Date of Birth: Feb 15, 1941 Referring Provider (PT): Dr. Burr Medico    Encounter Date: 08/10/2019  PT End of Session - 08/10/19 1740    Visit Number  3    Number of Visits  17    Date for PT Re-Evaluation  09/18/19    PT Start Time  1500    PT Stop Time  1545    PT Time Calculation (min)  45 min    Activity Tolerance  Patient tolerated treatment well    Behavior During Therapy  [facility] for tasks assessed/performed       Past Medical History:  Diagnosis Date  . Anemia   . Anxiety   . Arthritis   . Breast cancer ([place]) 1992  . Cataract    Bilateral eyes - surgery to remove  . Chronic kidney disease    CKD stage 3  . Complication of anesthesia    "something they use make me itch for a couple of days."  . Depression   . Diabetes mellitus    type 2  . Duodenitis 01/18/2002  . Fainting spell   . Family history of breast cancer   . Family history of prostate cancer   . GERD (gastroesophageal reflux disease)   . Heart murmur    never has caused any problems  . Hiatal hernia 08/08/2008, 01/18/2002  . History of pneumonia    x 2  . Hyperlipidemia   . Hypertension   . Liver cancer ([place]) 08/2018  . Lymphedema 2017   Right arm  . Pneumonia    x 2  . UTI (lower urinary tract infection)     Past Surgical History:  Procedure Laterality Date  . AXILLARY SURGERY     cyst removal, right  . COLONOSCOPY  09/23/2018   Dr. Havery Moros - polyps  . EYE SURGERY Bilateral    cataracts to remove  . LAPAROSCOPY N/A 11/17/2018   Procedure: LAPAROSCOPY DIAGNOSTIC, INTRAOPERATIVE ULTRASOUND, PERITONEAL BIOPSIES;  Surgeon: [name], MD;  Location: [place];  Service: General;  Laterality: N/A;  GENERAL AND EPIDURAL  . LIVER BIOPSY  08/2018   Dr. Lindwood Coke  . MASTECTOMY   1992   right, with flap  . NM MYOCAR PERF WALL MOTION  06/11/2009   Protocol:Bruce, post stress EF58%, EKG negative for ischemia, low risk  . PORTACATH PLACEMENT N/A 12/02/2018   Procedure: INSERTION PORT-A-CATH;  Surgeon: [name], MD;  Location: [place];  Service: General;  Laterality: N/A;  . RECONSTRUCTION BREAST W/ TRAM FLAP Right   . TONSILLECTOMY    . TRANSTHORACIC ECHOCARDIOGRAM  12/24/2009   LVEF =>55%, normal study  . UPPER GI ENDOSCOPY      There were no vitals filed for this visit.  Subjective Assessment - 08/10/19 1501    Subjective  Pt states her feet and hands have needles and pins all the time. She says needs help opening her water bottle.  She says she has OT yet or she might have missed the call    Pertinent History  Pt with Intrahepaticholangiocarcinoma with metastasis.  She was treated with chemotherapy and developed neuropathy in hands and feet. She is on an ongoing maintenance chemo every other week.  Past history includes breast cancer in 1992 with ALND and had problems with lymphedema in 2017.  She says she  has a sleeve but she doesn't wear it  that much History includes chronic kidney disease and diabetes type II    Patient Stated Goals  "to be able to be doing sonething for myself"    Currently in Pain?  --   numbness   Pain Score  10-Worst pain ever    Pain Location  --   hands and feet   Pain Orientation  Right;Left    Pain Descriptors / Indicators  Numbness    Pain Type  Neuropathic pain            LYMPHEDEMA/ONCOLOGY QUESTIONNAIRE - 08/10/19 1509      Right Upper Extremity Lymphedema   Olecranon Process  31 cm    15 cm Proximal to Ulnar Styloid Process  29.5 cm    10 cm Proximal to Ulnar Styloid Process  25.5 cm    Just Proximal to Ulnar Styloid Process  18 cm    Across Hand at [employer]  18.2 cm    At [place] of 2nd Digit  6.3 cm                 [facility] Adult PT Treatment/Exercise - 08/10/19 0001      Exercises   Exercises   Shoulder;Lumbar;Knee/Hip      Lumbar Exercises: Seated   Other Seated Lumbar Exercises  on red disc, anterior and posterior pelvic tilts       Knee/Hip Exercises: Standing   Functional Squat  10 reps   did not allow to push up with hand, controlled descent      Knee/Hip Exercises: Seated   Marching  Strengthening;Right;Left;10 reps    Sit to Sand  10 reps;without UE support      Knee/Hip Exercises: Supine   Short Arc Quad Sets  Strengthening;Right;Left;10 reps   4 pounds, over blue bolster x 10 reps   Heel Slides  Strengthening;Right;Left;5 reps   manual assist under heels to avoid friction    Hip Adduction Isometric  Strengthening;Right;Left;5 reps   4 pounds    Bridges  Strengthening;Right;Left;5 reps      Knee/Hip Exercises: Sidelying   Hip ABduction  Strengthening;Right;Left;10 reps      Shoulder Exercises: Supine   Other Supine Exercises  bicep curls 3 pounds 10 reps with left arm no weight with right arm       Shoulder Exercises: Standing   Row  Strengthening;Right;Left;10 reps;Theraband    Theraband Level (Shoulder Row)  Level 1 (Yellow)               PT Short Term Goals - 07/18/19 1721      PT SHORT TERM GOAL #1   Title  Pt will increase reptitions of sit to stand in 30 seconds to 8 indicating an improvment in functional strength    Baseline  5 reps in 30 sec on 07/18/219    Time  4    Period  Weeks    Status  New      PT SHORT TERM GOAL #2   Title  Pt will report she has noticed mild improvement in her ability to carry out her tasks at home    Time  4    Period  Weeks    Status  New      PT SHORT TERM GOAL #3   Title  Pt will decrease TUG score to < 10 seconds indicting an improvemet in functional mobility    Baseline  11.49 sec    Time  4    Period  Weeks    Status  New        PT Long Term Goals - 07/18/19 1723      PT LONG TERM GOAL #1   Title  Pt will increase reptitions of sit to stand in 30 seconds to 10 indicating an improvment in  functional strength    Baseline  5 on 07/18/2019    Time  8    Period  Weeks    Status  New      PT LONG TERM GOAL #2   Title  Pt will report she has noticed moderate mprovement in her ability to carry out her tasks at home    Time  8    Period  Weeks    Status  New      PT LONG TERM GOAL #3   Title  Pt will decrease TUG score to < 10 seconds indicting an improvemet in functional mobility    Baseline  11.49 on 07/18/2019    Time  8    Period  Weeks    Status  New            Plan - 08/10/19 1741    Clinical Impression Statement  Focused on core and LE exercises today.  Pt was able to perform exercises well.  She is limited by her right arm lymphedema and shoulder pain which has not increased since last visit.  She does not want to do wrapping on her arm as it limited her ability to take care of herself as she lives alone.    Comorbidities  previous breast cancer with lymphedema in her right arm, ongoing chemo. DM    Examination-Activity Limitations  Reach Overhead;Locomotion Level;Stand;Stairs;Squat;Dressing;Bend    Examination-Participation Restrictions  Community Activity;Meal Prep;Cleaning;Shop;Laundry    Stability/Clinical Decision Making  Evolving/Moderate complexity    Clinical Impairments Affecting Rehab Potential  history of right axillary lymph node dissection (in 1992), chemotherapy    PT Frequency  2x / week    PT Duration  8 weeks    PT Treatment/Interventions  ADLs/Self Care Home Management;Therapeutic exercise;DME Instruction;Patient/family education;Manual techniques;Manual lymph drainage;Compression bandaging;Passive range of motion;Taping;Aquatic Therapy;Gait training;Stair training;Balance training;Therapeutic activities;Functional mobility training    PT Next Visit Plan  Cont mat exercise for LE and core strength. progress balance activities as able; add balance HEP    Consulted and Agree with Plan of Care  Patient       Patient will benefit from skilled  therapeutic intervention in order to improve the following deficits and impairments:  Abnormal gait, Decreased balance, Decreased endurance, Difficulty walking, Impaired sensation, Impaired perceived functional ability, Increased edema, Decreased knowledge of precautions, Decreased range of motion, Decreased activity tolerance, Decreased strength, Postural dysfunction, Pain  Visit Diagnosis: Difficulty in walking, not elsewhere classified  Muscle weakness (generalized)  Unspecified disturbances of skin sensation     Problem List Patient Active Problem List   Diagnosis Date Noted  . Port-A-Cath in place 12/14/2018  . Goals of care, counseling/discussion 11/24/2018  . Status post laparoscopy 11/17/2018  . Genetic testing 11/07/2018  . Family history of prostate cancer   . Family history of breast cancer   . Intrahepatic cholangiocarcinoma ([place]) 09/20/2018  . Coronary artery calcification 04/20/2016  . Ductal carcinoma in situ (DCIS) of right breast 11/26/2015  . Lymphedema of arm 11/26/2015  . Lymphedema 11/01/2015  . Leg pain, bilateral 12/28/2013  . Special screening for malignant neoplasms, colon 08/09/2013  . Renal artery stenosis, native,  bilateral ([place]) 06/09/2013  . Diabetes mellitus with renal complications ([place]) 60/12/9321  . RASH AND OTHER NONSPECIFIC SKIN ERUPTION 07/05/2008  . Malignant neoplasm of female breast ([place]) 07/04/2008  . HYPERCHOLESTEROLEMIA 07/04/2008  . Essential hypertension 07/04/2008  . GERD 07/04/2008  . DUODENITIS, WITHOUT HEMORRHAGE 07/04/2008  . HIATAL HERNIA 07/04/2008  . Arthropathy 07/04/2008   Donato Heinz. Owens Shark PT  Norwood Levo 08/10/2019, 5:43 PM  [place], [place], [address] Phone: [phone]   Fax:  [phone]  Name: ALDINA PORTA MRN: [id number] Date of Birth: 1940-08-05

## 2019-08-10 NOTE — Addendum Note (Signed)
Addended by: [name] on: 08/10/2019 11:15 PM   Modules accepted: Orders

## 2019-08-10 NOTE — Telephone Encounter (Signed)
Scheduled appt per 5/26 los. Pt will get an updated appt calendar at their next scheduled appt.

## 2019-08-16 ENCOUNTER — Other Ambulatory Visit: Payer: Self-pay

## 2019-08-16 ENCOUNTER — Ambulatory Visit: Payer: Medicare Other | Attending: Hematology

## 2019-08-16 DIAGNOSIS — R209 Unspecified disturbances of skin sensation: Secondary | ICD-10-CM | POA: Diagnosis present

## 2019-08-16 DIAGNOSIS — M6281 Muscle weakness (generalized): Secondary | ICD-10-CM | POA: Diagnosis present

## 2019-08-16 DIAGNOSIS — R262 Difficulty in walking, not elsewhere classified: Secondary | ICD-10-CM | POA: Diagnosis present

## 2019-08-16 NOTE — Patient Instructions (Signed)
Access Code: [email or web address] URL: [email or web address] Date: 08/16/2019 Prepared by: Tomma Rakers  Exercises Supine March with Resistance Band - 1 x daily - 7 x weekly - 2 sets - 20 reps - hold the band in a criss cross around your knees put tension on your thighs so you have to work to keep them apart while marching. hold Supine Bridge - 1 x daily - 7 x weekly - 2 sets - 10 reps Supine Hamstring Stretch - 1 x daily - 7 x weekly - 1 sets - 3 reps - 3x each side 10 seconds to hold then switch to the other leg hold Supine Figure 4 Piriformis Stretch - 1 x daily - 7 x weekly - 1 sets - 2 reps - 20 seconds push down until you feel a stretch in your hip it should not be painful just stretching. hold Clamshell with Resistance - 1 x daily - 7 x weekly - 1 sets - 10 reps Sidelying Hip Abduction and Extension with Loop Band - 1 x daily - 7 x weekly - 1 sets - 10 reps Standing Hip Extension with Counter Support - 1 x daily - 7 x weekly - 1 sets - 10 reps - 10x each side hold Standing Hip Abduction with Counter Support - 1 x daily - 7 x weekly - 1 sets - 10 reps - 10x each side hold Standing Hip Flexion with Counter Support - 1 x daily - 7 x weekly - 1 sets - 10 reps - 10x each side hold Standing Tandem Balance with Counter Support - 1 x daily - 7 x weekly - 3 sets - 10 reps - hold 20 seconds try to pick up one hand at a time be safe if you feel unstable put your hand down. use something stable to hold onto like a kitchen counter. hold

## 2019-08-16 NOTE — Therapy (Addendum)
[place], [place], [address] Phone: [phone]   Fax:  [phone]  Physical Therapy Treatment  Patient Details  Name: Donna May MRN: [id number] Date of Birth: [birth year] Referring Provider (PT): Dr. Burr Medico    Encounter Date: 08/16/2019  PT End of Session - 08/16/19 1408    Visit Number  4    Number of Visits  17    Date for PT Re-Evaluation  09/18/19    PT Start Time  1406    PT Stop Time  1500    PT Time Calculation (min)  54 min    Activity Tolerance  Patient tolerated treatment well    Behavior During Therapy  [facility] for tasks assessed/performed       Past Medical History:  Diagnosis Date  . Anemia   . Anxiety   . Arthritis   . Breast cancer ([place]) 1992  . Cataract    Bilateral eyes - surgery to remove  . Chronic kidney disease    CKD stage 3  . Complication of anesthesia    "something they use make me itch for a couple of days."  . Depression   . Diabetes mellitus    type 2  . Duodenitis 01/18/2002  . Fainting spell   . Family history of breast cancer   . Family history of prostate cancer   . GERD (gastroesophageal reflux disease)   . Heart murmur    never has caused any problems  . Hiatal hernia 08/08/2008, 01/18/2002  . History of pneumonia    x 2  . Hyperlipidemia   . Hypertension   . Liver cancer ([place]) 08/2018  . Lymphedema 2017   Right arm  . Pneumonia    x 2  . UTI (lower urinary tract infection)     Past Surgical History:  Procedure Laterality Date  . AXILLARY SURGERY     cyst removal, right  . COLONOSCOPY  09/23/2018   Dr. Havery Moros - polyps  . EYE SURGERY Bilateral    cataracts to remove  . LAPAROSCOPY N/A 11/17/2018   Procedure: LAPAROSCOPY DIAGNOSTIC, INTRAOPERATIVE ULTRASOUND, PERITONEAL BIOPSIES;  Surgeon: [name], MD;  Location: [facility];  Service: General;  Laterality: N/A;  GENERAL AND EPIDURAL  . LIVER BIOPSY  08/2018   Dr. Lindwood Coke  . MASTECTOMY   1992   right, with flap  . NM MYOCAR PERF WALL MOTION  06/11/2009   Protocol:Bruce, post stress EF58%, EKG negative for ischemia, low risk  . PORTACATH PLACEMENT N/A 12/02/2018   Procedure: INSERTION PORT-A-CATH;  Surgeon: [name], MD;  Location: [facility];  Service: General;  Laterality: N/A;  . RECONSTRUCTION BREAST W/ TRAM FLAP Right   . TONSILLECTOMY    . TRANSTHORACIC ECHOCARDIOGRAM  12/24/2009   LVEF =>55%, normal study  . UPPER GI ENDOSCOPY      There were no vitals filed for this visit.  Subjective Assessment - 08/16/19 1408    Subjective  Pt states that what bothers her the most is her balance. She has shooting pains in her legs but it is brief. She states that she would like some pictures of her exercises.    Pertinent History  Pt with Intrahepaticholangiocarcinoma with metastasis.  She was treated with chemotherapy and developed neuropathy in hands and feet. She is on an ongoing maintenance chemo every other week.  Past history includes breast cancer in 1992 with ALND and had problems with lymphedema in 2017.  She says she has a  sleeve but she doesn't wear it  that much History includes chronic kidney disease and diabetes type II    Patient Stated Goals  "to be able to be doing sonething for myself"    Currently in Pain?  --   pt did not rate states pain is occasional in her legs and neuropathy bothers her.                       [facility] Adult PT Treatment/Exercise - 08/16/19 0001      Neuro Re-ed    Neuro Re-ed Details   Standing hip extension/abduction and flexion 10x on the R then the L with UE support and demonstration for correct movement. VC for standing tall. Tandem stance with intermittent unilateral support and bil support. 2x 20 seconds bil       Lumbar Exercises: Supine   Bridge  10 reps    Other Supine Lumbar Exercises  Supine marching with crossed red theraband 2x 20 alt with VC for correct movement and for resistance at the hips to activate hip  abductors. 2 sets.     Other Supine Lumbar Exercises  Dynamic HS stretch 3x 10 seconds Bil with VC for correct movement, supine piriformis stretch 2x 20 seconds bil with VC for correct movement.       Lumbar Exercises: Sidelying   Clam  Right;Left;10 reps    Clam Limitations  red theraband VC to not let pelvis rotatie posteriorly to prevent compensation with the quad    Hip Abduction  Right;Left;10 reps    Hip Abduction Limitations  red theraband VC for correct movement and tactile cueing to prevent posterior rotation of the              PT Education - 08/16/19 1555    Education Details  Access Code: [email or web address], pt was provided with LE strengthening and balance exercises for home. Discussed being safe with performing exercises at home especially in standing using something stable to hold on to.    Person(s) Educated  Patient    Methods  Explanation;Tactile cues;Demonstration;Verbal cues;Handout    Comprehension  Verbalized understanding;Returned demonstration       PT Short Term Goals - 07/18/19 1721      PT SHORT TERM GOAL #1   Title  Pt will increase reptitions of sit to stand in 30 seconds to 8 indicating an improvment in functional strength    Baseline  5 reps in 30 sec on 07/18/219    Time  4    Period  Weeks    Status  New      PT SHORT TERM GOAL #2   Title  Pt will report she has noticed mild improvement in her ability to carry out her tasks at home    Time  4    Period  Weeks    Status  New      PT SHORT TERM GOAL #3   Title  Pt will decrease TUG score to < 10 seconds indicting an improvemet in functional mobility    Baseline  11.49 sec    Time  4    Period  Weeks    Status  New        PT Long Term Goals - 07/18/19 1723      PT LONG TERM GOAL #1   Title  Pt will increase reptitions of sit to stand in 30 seconds to 10 indicating an improvment in functional strength    Baseline  5 on 07/18/2019    Time  8    Period  Weeks    Status  New      PT LONG TERM  GOAL #2   Title  Pt will report she has noticed moderate mprovement in her ability to carry out her tasks at home    Time  8    Period  Weeks    Status  New      PT LONG TERM GOAL #3   Title  Pt will decrease TUG score to < 10 seconds indicting an improvemet in functional mobility    Baseline  11.49 on 07/18/2019    Time  8    Period  Weeks    Status  New            Plan - 08/16/19 1407    Clinical Impression Statement  Pt presents to physical thearpy with continued weakness/balance deficits related to deconditioning and neuropathy. This session was focused on a home exercise program for patient for LE strengthening and balance for home with an emphasis on safety and form to adequately address the correct muscles. Pt was able to tolerate supine/side-lying and standing hip strengthening exercises this session w/o a significant increase in pain. She required bil UE support but was able to perform unilateral UE support with balance activities. Pt will benefit from continued POC at this time.    Personal Factors and Comorbidities  Comorbidity 3+;Past/Current Experience;Age    Comorbidities  previous breast cancer with lymphedema in her right arm, ongoing chemo. DM    Examination-Activity Limitations  Reach Overhead;Locomotion Level;Stand;Stairs;Squat;Dressing;Bend    Rehab Potential  Good    Clinical Impairments Affecting Rehab Potential  history of right axillary lymph node dissection (in 1992), chemotherapy    PT Frequency  2x / week    PT Duration  8 weeks    PT Treatment/Interventions  ADLs/Self Care Home Management;Therapeutic exercise;DME Instruction;Patient/family education;Manual techniques;Manual lymph drainage;Compression bandaging;Passive range of motion;Taping;Aquatic Therapy;Gait training;Stair training;Balance training;Therapeutic activities;Functional mobility training    PT Next Visit Plan  Cont mat exercise for LE and core strength. progress balance activities as able; add  balance HEP    PT Home Exercise Plan  Access Code: [email or web address]    Consulted and Agree with Plan of Care  Patient       Patient will benefit from skilled therapeutic intervention in order to improve the following deficits and impairments:  Abnormal gait, Decreased balance, Decreased endurance, Difficulty walking, Impaired sensation, Impaired perceived functional ability, Increased edema, Decreased knowledge of precautions, Decreased range of motion, Decreased activity tolerance, Decreased strength, Postural dysfunction, Pain  Visit Diagnosis: Difficulty in walking, not elsewhere classified  Muscle weakness (generalized)  Unspecified disturbances of skin sensation     Problem List Patient Active Problem List   Diagnosis Date Noted  . Port-A-Cath in place 12/14/2018  . Goals of care, counseling/discussion 11/24/2018  . Status post laparoscopy 11/17/2018  . Genetic testing 11/07/2018  . Family history of prostate cancer   . Family history of breast cancer   . Intrahepatic cholangiocarcinoma ([place]) 09/20/2018  . Coronary artery calcification 04/20/2016  . Ductal carcinoma in situ (DCIS) of right breast 11/26/2015  . Lymphedema of arm 11/26/2015  . Lymphedema 11/01/2015  . Leg pain, bilateral 12/28/2013  . Special screening for malignant neoplasms, colon 08/09/2013  . Renal artery stenosis, native, bilateral ([place]) 06/09/2013  . Diabetes mellitus with renal complications ([place]) 33/35/4562  . RASH AND OTHER NONSPECIFIC SKIN ERUPTION 07/05/2008  . Malignant  neoplasm of female breast ([place]) 07/04/2008  . HYPERCHOLESTEROLEMIA 07/04/2008  . Essential hypertension 07/04/2008  . GERD 07/04/2008  . DUODENITIS, WITHOUT HEMORRHAGE 07/04/2008  . HIATAL HERNIA 07/04/2008  . Arthropathy 07/04/2008    Ander Purpura, PT 08/16/2019, 3:58 PM  [place], [place], [address] Phone: [phone]   Fax:   [phone]  Name: Donna May MRN: [id number] Date of Birth: [birth year]

## 2019-08-18 ENCOUNTER — Telehealth: Payer: Self-pay | Admitting: Internal Medicine

## 2019-08-18 NOTE — Telephone Encounter (Signed)
Received a staff msg to schedule Ms. Pittmon to see Dr. Mickeal Skinner. I cld and left the pt a vm to schedule.

## 2019-08-19 LAB — METHYLMALONIC ACID, SERUM: Methylmalonic Acid, Quantitative: 232 nmol/L (ref 0–378)

## 2019-08-21 ENCOUNTER — Ambulatory Visit: Payer: Medicare Other

## 2019-08-21 ENCOUNTER — Other Ambulatory Visit: Payer: Self-pay

## 2019-08-21 DIAGNOSIS — R262 Difficulty in walking, not elsewhere classified: Secondary | ICD-10-CM | POA: Diagnosis not present

## 2019-08-21 DIAGNOSIS — R209 Unspecified disturbances of skin sensation: Secondary | ICD-10-CM

## 2019-08-21 DIAGNOSIS — M6281 Muscle weakness (generalized): Secondary | ICD-10-CM

## 2019-08-21 NOTE — Therapy (Signed)
[place], [place], [address] Phone: [phone]   Fax:  [phone]  Physical Therapy Treatment  Patient Details  Name: Donna May MRN: [id number] Date of Birth: 10-25-1940 Referring Provider (PT): Dr. Burr Medico    Encounter Date: 08/21/2019  PT End of Session - 08/21/19 1501    Visit Number  5    Number of Visits  17    Date for PT Re-Evaluation  09/18/19    PT Start Time  1406    PT Stop Time  1502    PT Time Calculation (min)  56 min    Activity Tolerance  Patient tolerated treatment well    Behavior During Therapy  [facility] for tasks assessed/performed       Past Medical History:  Diagnosis Date  . Anemia   . Anxiety   . Arthritis   . Breast cancer ([place]) 1992  . Cataract    Bilateral eyes - surgery to remove  . Chronic kidney disease    CKD stage 3  . Complication of anesthesia    "something they use make me itch for a couple of days."  . Depression   . Diabetes mellitus    type 2  . Duodenitis 01/18/2002  . Fainting spell   . Family history of breast cancer   . Family history of prostate cancer   . GERD (gastroesophageal reflux disease)   . Heart murmur    never has caused any problems  . Hiatal hernia 08/08/2008, 01/18/2002  . History of pneumonia    x 2  . Hyperlipidemia   . Hypertension   . Liver cancer ([place]) 08/2018  . Lymphedema 2017   Right arm  . Pneumonia    x 2  . UTI (lower urinary tract infection)     Past Surgical History:  Procedure Laterality Date  . AXILLARY SURGERY     cyst removal, right  . COLONOSCOPY  09/23/2018   Dr. Havery Moros - polyps  . EYE SURGERY Bilateral    cataracts to remove  . LAPAROSCOPY N/A 11/17/2018   Procedure: LAPAROSCOPY DIAGNOSTIC, INTRAOPERATIVE ULTRASOUND, PERITONEAL BIOPSIES;  Surgeon: [name], MD;  Location: [place];  Service: General;  Laterality: N/A;  GENERAL AND EPIDURAL  . LIVER BIOPSY  08/2018   Dr. Lindwood Coke  . MASTECTOMY   1992   right, with flap  . NM MYOCAR PERF WALL MOTION  06/11/2009   Protocol:[place], post stress EF58%, EKG negative for ischemia, low risk  . PORTACATH PLACEMENT N/A 12/02/2018   Procedure: INSERTION PORT-A-CATH;  Surgeon: [name], MD;  Location: [place];  Service: General;  Laterality: N/A;  . RECONSTRUCTION BREAST W/ TRAM FLAP Right   . TONSILLECTOMY    . TRANSTHORACIC ECHOCARDIOGRAM  12/24/2009   LVEF =>55%, normal study  . UPPER GI ENDOSCOPY      There were no vitals filed for this visit.  Subjective Assessment - 08/21/19 1431    Subjective  I've been doing my HEP as able. I get tired so I can't always get through them all at once. I've been taking the gabapentin but it makes me so sleepy and I can't tell that it's helping my CIPN symptoms.    Pertinent History  Pt with Intrahepaticholangiocarcinoma with metastasis.  She was treated with chemotherapy and developed neuropathy in hands and feet. She is on an ongoing maintenance chemo every other week.  Past history includes breast cancer in 1992 with ALND and had problems with lymphedema  in 2017.  She says she has a sleeve but she doesn't wear it  that much History includes chronic kidney disease and diabetes type II    Patient Stated Goals  "to be able to be doing sonething for myself"    Currently in Pain?  Other (Comment)   just tingles in leg and feet all the time                       [facility] Adult PT Treatment/Exercise - 08/21/19 0001      Neuro Re-ed    Neuro Re-ed Details   Progressed balance HEP to include: Front and then retro tandem walking, bil sidestepping, then [place] posteriorly; all done in hall with fingertips along wall. Seated rest, then bil sidestepping on and off [employer] at back of bike for +2 HHA x10 each with cuing to try to keep gaze ahead and not down; then alternating foot taps on 6" step x2 mins, still at back of bike for +2 HHA but with washcloths due to increased irritation in hands; then  ball toss with yellow physioball with pt standing on Airex in corner x~1 min      Lumbar Exercises: Aerobic   Stationary Bike  No resistance x5 mins with therapist monitoring pts tolerance during                PT Short Term Goals - 07/18/19 1721      PT SHORT TERM GOAL #1   Title  Pt will increase reptitions of sit to stand in 30 seconds to 8 indicating an improvment in functional strength    Baseline  5 reps in 30 sec on 07/18/219    Time  4    Period  Weeks    Status  New      PT SHORT TERM GOAL #2   Title  Pt will report she has noticed mild improvement in her ability to carry out her tasks at home    Time  4    Period  Weeks    Status  New      PT SHORT TERM GOAL #3   Title  Pt will decrease TUG score to < 10 seconds indicting an improvemet in functional mobility    Baseline  11.49 sec    Time  4    Period  Weeks    Status  New        PT Long Term Goals - 07/18/19 1723      PT LONG TERM GOAL #1   Title  Pt will increase reptitions of sit to stand in 30 seconds to 10 indicating an improvment in functional strength    Baseline  5 on 07/18/2019    Time  8    Period  Weeks    Status  New      PT LONG TERM GOAL #2   Title  Pt will report she has noticed moderate mprovement in her ability to carry out her tasks at home    Time  8    Period  Weeks    Status  New      PT LONG TERM GOAL #3   Title  Pt will decrease TUG score to < 10 seconds indicting an improvemet in functional mobility    Baseline  11.49 on 07/18/2019    Time  8    Period  Weeks    Status  New  Plan - 08/21/19 1502    Clinical Impression Statement  Pt has been working on progressing towards independence with HEP but reports still requiring multiple rest breaks due to fear of falling. Spent time during pts rest breaks today educating her how to incoporate exs into her ADLs and that it's okay if she can't do all exs at once. Can do them intermittently during day and progressed HEP  to include some balance activities she could do in hall or at kitchen counter during her day. Though she requires multiple rest breaks due to increased tingling symptoms, she toelrates the exs well with being able to self correct with min LOB during session.    Personal Factors and Comorbidities  Comorbidity 3+;Past/Current Experience;Age    Comorbidities  previous breast cancer with lymphedema in her right arm, ongoing chemo. DM    Examination-Activity Limitations  Reach Overhead;Locomotion Level;Stand;Stairs;Squat;Dressing;Bend    Examination-Participation Restrictions  Community Activity;Meal Prep;Cleaning;Shop;Laundry    Stability/Clinical Decision Making  Evolving/Moderate complexity    Rehab Potential  Good    Clinical Impairments Affecting Rehab Potential  history of right axillary lymph node dissection (in 1992), chemotherapy    PT Frequency  2x / week    PT Duration  8 weeks    PT Treatment/Interventions  ADLs/Self Care Home Management;Therapeutic exercise;DME Instruction;Patient/family education;Manual techniques;Manual lymph drainage;Compression bandaging;Passive range of motion;Taping;Aquatic Therapy;Gait training;Stair training;Balance training;Therapeutic activities;Functional mobility training    PT Next Visit Plan  Cont mat exercise for LE and core strength. progress balance activities as able; add balance HEP prn and review new HEP issued today    PT Home Exercise Plan  Access Code: [email or web address]       Patient will benefit from skilled therapeutic intervention in order to improve the following deficits and impairments:  Abnormal gait, Decreased balance, Decreased endurance, Difficulty walking, Impaired sensation, Impaired perceived functional ability, Increased edema, Decreased knowledge of precautions, Decreased range of motion, Decreased activity tolerance, Decreased strength, Postural dysfunction, Pain  Visit Diagnosis: Difficulty in walking, not elsewhere classified  Muscle  weakness (generalized)  Unspecified disturbances of skin sensation     Problem List Patient Active Problem List   Diagnosis Date Noted  . Port-A-Cath in place 12/14/2018  . Goals of care, counseling/discussion 11/24/2018  . Status post laparoscopy 11/17/2018  . Genetic testing 11/07/2018  . Family history of prostate cancer   . Family history of breast cancer   . Intrahepatic cholangiocarcinoma ([place]) 09/20/2018  . Coronary artery calcification 04/20/2016  . Ductal carcinoma in situ (DCIS) of right breast 11/26/2015  . Lymphedema of arm 11/26/2015  . Lymphedema 11/01/2015  . Leg pain, bilateral 12/28/2013  . Special screening for malignant neoplasms, colon 08/09/2013  . Renal artery stenosis, native, bilateral ([place]) 06/09/2013  . Diabetes mellitus with renal complications ([place]) 23/55/7322  . RASH AND OTHER NONSPECIFIC SKIN ERUPTION 07/05/2008  . Malignant neoplasm of female breast ([place]) 07/04/2008  . HYPERCHOLESTEROLEMIA 07/04/2008  . Essential hypertension 07/04/2008  . GERD 07/04/2008  . DUODENITIS, WITHOUT HEMORRHAGE 07/04/2008  . HIATAL HERNIA 07/04/2008  . Arthropathy 07/04/2008    Otelia Limes, PTA 08/21/2019, 3:08 PM  [place], [place], [address] Phone: [phone]   Fax:  [phone]  Name: DAMYIAH MOXLEY MRN: [id number] Date of Birth: 10/01/1940

## 2019-08-22 ENCOUNTER — Telehealth: Payer: Self-pay | Admitting: Internal Medicine

## 2019-08-22 NOTE — Telephone Encounter (Signed)
Ms. Pytel returned my call and has been scheduled to see Dr. Mickeal Skinner on 6/14 at 1030am. Pt aware to arrive 15 minutes early.

## 2019-08-22 NOTE — Progress Notes (Signed)
[place]   [phone] [phone]   Clinic Follow up Note   Patient Care Team: [name], MD as PCP - General (Family Medicine) [name], [name], MD as Consulting Physician (Cardiology) [name], MD as Consulting Physician (Obstetrics and Gynecology) [name], [name], NP as Nurse Practitioner (Hematology and Oncology) [facility] [name], MD as Consulting Physician (Hematology) [name], [name], MD as Consulting Physician (Gastroenterology) [name], RN (Inactive) as Oncology Nurse Navigator [name], MD as Consulting Physician (General Surgery) 08/23/2019  CHIEF COMPLAINT: F/u cholangiocarcinoma   SUMMARY OF ONCOLOGIC HISTORY: Oncology History  Malignant neoplasm of female breast ([place])  11/06/2018 Genetic Testing   Negative genetic testing on the [facility] Common Hereditary Cancers panel. A variant of uncertain significance was identified in one of her APC genes, called c.1243G>A (p.Ala415Thr).  The Common Hereditary Cancers Panel offered by [facility] includes sequencing and/or deletion duplication testing of the following 48 genes: APC, ATM, AXIN2, BARD1, BMPR1A, BRCA1, BRCA2, BRIP1, CDH1, CDK4, CDKN2A (p14ARF), CDKN2A (p16INK4a), CHEK2, CTNNA1, DICER1, EPCAM (Deletion/duplication testing only), GREM1 (promoter region deletion/duplication testing only), KIT, MEN1, MLH1, MSH2, MSH3, MSH6, MUTYH, NBN, NF1, NHTL1, PALB2, PDGFRA, PMS2, POLD1, POLE, PTEN, RAD50, RAD51C, RAD51D, RNF43, SDHB, SDHC, SDHD, SMAD4, SMARCA4. STK11, TP53, TSC1, TSC2, and VHL.  The following genes were evaluated for sequence changes only: SDHA and HOXB13 c.251G>A variant only.    Intrahepatic cholangiocarcinoma ([place])  09/07/2018 Imaging   CT Chest IMPRESSION: 1. New, enhancing mass involving segment 4 of the liver and fundus of gallbladder is concerning for malignancy. This may represent either metastatic disease from breast  cancer or neoplasm primary to the liver or hepatic biliary tree. Further evaluation with contrast enhanced CT of the abdomen and pelvis is recommended. 2. No findings to suggest metastatic disease within the chest. 3.  Aortic Atherosclerosis (ICD10-I70.0). 4. Coronary artery calcifications.   09/13/2018 Pathology Results   Diagnosis Liver, needle/core biopsy - ADENOCARCINOMA. Microscopic Comment Immunohistochemistry for CK7 is positive. CK20, TTF1, CDX-2, GATA-3, PAX 8, Qualitative ER, p63 and CK5/6 are negative. The provided clinical history of remote mammary carcinoma is noted. Based on the morphology and immunophenotype of the adenocarcinoma observed in this specimen, primary cholangiocarcinoma is favored. Clinical and radiologic correlation are  encouraged. Results reported to [employer] on 09/15/2018. Intradepartmental consultation (Dr. Vic Ripper).   09/13/2018 Initial Diagnosis   Cholangiocarcinoma ([place])   09/23/2018 Procedure   Colonoscopy by Dr. Havery Moros 09/23/18  IMPRESSION - Two 3 to 4 mm polyps in the ascending colon, removed with a cold snare. Resected and retrieved. - Five 3 to 5 mm polyps in the transverse colon, removed with a cold snare. Resected and retrieved. - One 5 mm polyp at the splenic flexure, removed with a cold snare. Resected and retrieved. - Three 3 to 5 mm polyps in the sigmoid colon, removed with a cold snare. Resected and retrieved. - The examination was otherwise normal. Upper Endopscy by Dr. Havery Moros 09/23/18  IMPRESSION - Esophagogastric landmarks identified. - 2 cm hiatal hernia. - Normal esophagus otherwise. - A single gastric polyp. Resected and retrieved. - Mild gastritis. Biopsied. - Normal duodenal bulb and second portion of the duodenum.   09/23/2018 Pathology Results   Diagnosis 09/23/18 1. Surgical [P], duodenum - BENIGN SMALL BOWEL MUCOSA. - NO ACTIVE INFLAMMATION OR VILLOUS ATROPHY IDENTIFIED. 2. Surgical [P], stomach, polyp -  HYPERPLASTIC POLYP(S). - THERE IS NO EVIDENCE OF MALIGNANCY. 3. Surgical [P], gastric antrum and gastric body - CHRONIC INACTIVE GASTRITIS. -  THERE IS NO EVIDENCE OF HELICOBACTER-PYLORI, DYSPLASIA, OR MALIGNANCY. - SEE COMMENT. 4. Surgical [P], colon, sigmoid, splenic flexure, transverse and ascending, polyp (9) - TUBULAR ADENOMA(S). - SESSILE SERRATED POLYP WITHOUT CYTOLOGIC DYSPLASIA. - HIGH GRADE DYSPLASIA IS NOT IDENTIFIED. 5. Surgical [P], colon, sigmoid, polyp (2) - HYPERPLASTIC POLYP(S). - THERE IS NO EVIDENCE OF MALIGNANCY.   09/23/2018 Cancer Staging   Staging form: Intrahepatic Bile Duct, AJCC 8th Edition - Clinical stage from 09/23/2018: Stage IB (cT1b, cN0, cM0) - Signed by [name], MD on 10/06/2018   09/29/2018 PET scan   PET 09/29/18 IMPRESSION: 1. Hypermetabolic mass in the RIGHT hepatic lobe consistent with biopsy proven adenocarcinoma. No additional liver metastasis. 2. No evidence of local breast cancer recurrence in the RIGHT breast or RIGHT axilla. 3. Mild bilateral hypermetabolic adrenal glands is favored benign hyperplasia. 4. No evidence of additional metastatic disease on skull base to thigh FDG PET scan.   11/06/2018 Genetic Testing   Negative genetic testing on the [facility] Common Hereditary Cancers panel. A variant of uncertain significance was identified in one of her APC genes, called c.1243G>A (p.Ala415Thr).  The Common Hereditary Cancers Panel offered by [facility] includes sequencing and/or deletion duplication testing of the following 48 genes: APC, ATM, AXIN2, BARD1, BMPR1A, BRCA1, BRCA2, BRIP1, CDH1, CDK4, CDKN2A (p14ARF), CDKN2A (p16INK4a), CHEK2, CTNNA1, DICER1, EPCAM (Deletion/duplication testing only), GREM1 (promoter region deletion/duplication testing only), KIT, MEN1, MLH1, MSH2, MSH3, MSH6, MUTYH, NBN, NF1, NHTL1, PALB2, PDGFRA, PMS2, POLD1, POLE, PTEN, RAD50, RAD51C, RAD51D, RNF43, SDHB, SDHC, SDHD, SMAD4, SMARCA4. STK11, TP53, TSC1, TSC2, and VHL.   The following genes were evaluated for sequence changes only: SDHA and HOXB13 c.251G>A variant only.    11/17/2018 Pathology Results   Diagnosis 11/17/18 1. Soft tissue, biopsy, Diaphragmatic nodules - METASTATIC ADENOCARCINOMA, CONSISTENT WITH PATIENT'S CLINICAL HISTORY OF CHOLANGIOCARCINOMA. SEE NOTE 2. Liver, biopsy, Left - LIVER PARENCHYMA WITH A BENIGN FIBROTIC NODULE - NO EVIDENCE OF MALIGNANCY 3. Stomach, biopsy - BENIGN PAPILLARY MESOTHELIAL HYPERPLASIA - NO EVIDENCE OF MALIGNANCY   11/29/2018 Imaging   CT CAP WO Contrast  IMPRESSION: 1. Dominant liver mass appears grossly stable from 09/29/2018. Additional liver lesions are too small to characterize but were not shown to be hypermetabolic on PET. 2. Mild nodularity of both adrenal glands with associated hypermetabolism on 09/29/2018. Continued attention on follow-up exams is warranted. 3. Small right lower lobe nodules, stable from 09/07/2018. Again, attention on follow-up is recommended. 4. Trace bilateral pleural fluid. 5. Aortic atherosclerosis (ICD10-170.0). Coronary artery calcification. 6. Enlarged pulmonic trunk, indicative of pulmonary arterial hypertension.     11/30/2018 - 06/14/2019 Chemotherapy   First line chemo Oxaliplatin and gemcitabine q2weeks starting 11/30/18. Stopped Oxaliplatin on 05/03/19 due to worsening neuropathy. Added Cisplatin with C13 on 05/17/19 and stopped on C15 06/14/19 due to neuropathy. Reduced to maintenance single agent Gemcitabine on 06/28/19.    01/20/2019 Imaging   CT CAP IMPRESSION: restaging  1. Mild interval increase in size of dominant liver mass involving segment 4 and segment 5. 2. No significant or progressive adrenal nodularity identified to suggest metastatic disease. 3. Unchanged appearance of small right lower lobe and lingular lung nodules, nonspecific.   03/21/2019 PET scan   IMPRESSION: 1. Large right hepatic mass with SUV uptake near background hepatic activity, dramatic  response to therapy, also with decrease in size when compared to the prior study. 2. Signs of prior right mastectomy and axillary dissection as before. 3. Left adrenal activity in no longer above the level of activity seen in  the contralateral, right adrenal gland or in the liver. Attention on follow-up. 4. No new signs of disease.   06/27/2019 PET scan   IMPRESSION: 1. Further decrease in size of a dominant hepatic mass which is non FDG avid. 2. No evidence of metastatic disease. 3. No evidence of left adrenal hypermetabolism or mass. 4. Incidental findings, including: Uterine fibroids. Coronary artery atherosclerosis. Aortic Atherosclerosis (ICD10-I70.0). Pulmonary artery enlargement suggests pulmonary arterial hypertension.   06/28/2019 -  Chemotherapy   Maintenance single agent Gemcitabine q2weeks  starting on 06/28/19 with C16.      CURRENT THERAPY: Maintenance single agent Gemcitabine q2weeks starting on 06/28/19 with C16.  INTERVAL HISTORY: Ms. Scarboro returns for f/u and treatment as scheduled. She completed another cycle of single agent gemcitabine on 08/09/19. She is doing well, no new issues. Dealing with "the same neuropathy" and ambulating with a cane. She has had a few sessions of PT. She has consult with Dr. Mickeal Skinner next week. She does not take cymbalta or gabapentin consistently due to day time drowsiness. She says gabapentin helps by putting her to sleep. She doesn't want to feel drowsy next day. Her appetite is good. Tried Ensure but "went through me" and wants a lactose-free option. Denies n/v/c/d. She has mild abdominal pain that resolves with flatus or BM. Takes norco sparingly. Had a "little funny feeling in my chest" and burps occasionally. Takes prevacid. Denies associated cough, dyspnea, palpitations, dizziness or other symptoms. Denies recent fever, chills.    MEDICAL HISTORY:  Past Medical History:  Diagnosis Date  . Anemia   . Anxiety   . Arthritis   . Breast  cancer ([place]) 1992  . Cataract    Bilateral eyes - surgery to remove  . Chronic kidney disease    CKD stage 3  . Complication of anesthesia    "something they use make me itch for a couple of days."  . Depression   . Diabetes mellitus    type 2  . Duodenitis 01/18/2002  . Fainting spell   . Family history of breast cancer   . Family history of prostate cancer   . GERD (gastroesophageal reflux disease)   . Heart murmur    never has caused any problems  . Hiatal hernia 08/08/2008, 01/18/2002  . History of pneumonia    x 2  . Hyperlipidemia   . Hypertension   . Liver cancer ([place]) 08/2018  . Lymphedema 2017   Right arm  . Pneumonia    x 2  . UTI (lower urinary tract infection)     SURGICAL HISTORY: Past Surgical History:  Procedure Laterality Date  . AXILLARY SURGERY     cyst removal, right  . COLONOSCOPY  09/23/2018   Dr. Havery Moros - polyps  . EYE SURGERY Bilateral    cataracts to remove  . LAPAROSCOPY N/A 11/17/2018   Procedure: LAPAROSCOPY DIAGNOSTIC, INTRAOPERATIVE ULTRASOUND, PERITONEAL BIOPSIES;  Surgeon: [name], MD;  Location: [facility];  Service: General;  Laterality: N/A;  GENERAL AND EPIDURAL  . LIVER BIOPSY  08/2018   Dr. Lindwood Coke  . MASTECTOMY  1992   right, with flap  . NM MYOCAR PERF WALL MOTION  06/11/2009   Protocol:[place], post stress EF58%, EKG negative for ischemia, low risk  . PORTACATH PLACEMENT N/A 12/02/2018   Procedure: INSERTION PORT-A-CATH;  Surgeon: [name], MD;  Location: [facility];  Service: General;  Laterality: N/A;  . RECONSTRUCTION BREAST W/ TRAM FLAP Right   . TONSILLECTOMY    . TRANSTHORACIC ECHOCARDIOGRAM  12/24/2009   LVEF =>55%, normal study  . UPPER GI ENDOSCOPY      I have reviewed the social history and family history with the patient and they are unchanged from previous note.  ALLERGIES:  has No Known Allergies.  MEDICATIONS:  Current Outpatient Medications  Medication Sig Dispense Refill  . amLODipine (NORVASC) 10 MG  tablet TAKE 1 TABLET BY MOUTH EVERY DAY 90 tablet 3  . aspirin 81 MG tablet Take 81 mg by mouth at bedtime.     . Calcium Carbonate-Vitamin D 600-200 MG-UNIT TABS Take 1 tablet by mouth daily.    Marland Kitchen CALCIUM-MAGNESIUM-VITAMIN D PO Take 1 tablet by mouth once a week.     . Cholecalciferol (VITAMIN D3) 2000 UNITS TABS Take 1 tablet by mouth once a week.     Marland Kitchen Echinacea-Goldenseal (ECHINACEA COMB/GOLDEN SEAL) CAPS Take by mouth.    Marland Kitchen glucose blood test strip 1 each by Other route 2 (two) times daily. Use Onetouch verio test strips as instructed to check blood sugar twice daily. 100 each 2  . hydrochlorothiazide (HYDRODIURIL) 25 MG tablet Take 1 tablet (25 mg total) by mouth daily. 90 tablet 3  . HYDROcodone-acetaminophen (NORCO/VICODIN) 5-325 MG tablet Take 1 tablet by mouth every 6 (six) hours as needed for moderate pain. 10 tablet 0  . ketoconazole (NIZORAL) 2 % cream Apply 1 fingertip amount to each foot daily. 30 g 0  . lansoprazole (PREVACID) 15 MG capsule Take 1 capsule (15 mg total) by mouth daily. 90 capsule 1  . lidocaine-prilocaine (EMLA) cream Apply to affected area once 90 g 1  . loratadine (CLARITIN) 10 MG tablet Take 10 mg by mouth daily as needed for allergies.    . metFORMIN (GLUCOPHAGE-XR) 500 MG 24 hr tablet Take 1 tablet (500 mg total) by mouth 2 (two) times daily before a meal. 180 tablet 1  . metoprolol succinate (TOPROL-XL) 100 MG 24 hr tablet Take 1 tablet (100 mg total) by mouth daily. Take with or immediately following a meal. 90 tablet 1  . Omega 3 1000 MG CAPS Take 2,000 mg by mouth daily.     . ondansetron (ZOFRAN) 8 MG tablet Take 1 tablet (8 mg total) by mouth every 8 (eight) hours as needed for nausea or vomiting. 30 tablet 0  . Prenatal Vit-Fe Fumarate-FA (MULTIVITAMIN-PRENATAL) 27-0.8 MG TABS tablet Take 1 tablet by mouth daily at 12 noon.    . prochlorperazine (COMPAZINE) 10 MG tablet Take 1 tablet (10 mg total) by mouth every 6 (six) hours as needed for nausea or  vomiting. 30 tablet 0  . raloxifene (EVISTA) 60 MG tablet TAKE 1 TABLET DAILY 90 tablet 0  . rosuvastatin (CRESTOR) 20 MG tablet Take 1 tablet (20 mg total) by mouth daily. 90 tablet 1  . sitaGLIPtin (JANUVIA) 100 MG tablet Take 1 tablet (100 mg total) by mouth daily. 90 tablet 4  . triamcinolone cream (KENALOG) 0.1 % Apply 1 application topically 2 (two) times daily. 30 g 0  . valsartan (DIOVAN) 320 MG tablet Take 1 tablet (320 mg total) by mouth daily. 90 tablet 3  . DULoxetine (CYMBALTA) 30 MG capsule Take 1 capsule (30 mg total) by mouth daily. 90 capsule 1  . gabapentin (NEURONTIN) 100 MG capsule Take 1-3 capsules (100-300 mg total) by mouth 3 (three) times daily. Take 1 cap twice daily during day and 3 cap at night 360 capsule 1   Current Facility-Administered Medications  Medication Dose Route Frequency Provider Last Rate Last  Admin  . 0.9 %  sodium chloride infusion   Intravenous Once Cira Rue K, NP      . triamcinolone acetonide (KENALOG) 10 MG/ML injection 10 mg  10 mg Other Once Harriet Masson, DPM       Facility-Administered Medications Ordered in Other Visits  Medication Dose Route Frequency Provider Last Rate Last Admin  . 0.9 %  sodium chloride infusion   Intravenous Once Alla Feeling, NP 500 mL/hr at 08/23/19 0943 New Bag at 08/23/19 0943  . gemcitabine (GEMZAR) 1,330 mg in sodium chloride 0.9 % 250 mL chemo infusion  1,330 mg Intravenous Once [name], MD      . heparin lock flush 100 unit/mL  500 Units Intracatheter Once PRN [name], MD      . sodium chloride flush (NS) 0.9 % injection 10 mL  10 mL Intracatheter PRN [name], MD        PHYSICAL EXAMINATION: ECOG PERFORMANCE STATUS: 1 - Symptomatic but completely ambulatory  Vitals:   08/23/19 0821 08/23/19 0850  BP: (!) 170/76 (!) 158/64  Pulse: 73   Resp: 18   Temp: 97.8 F (36.6 C)   SpO2: 100%    Filed Weights   08/23/19 0821  Weight: 129 lb 14.4 oz (58.9 kg)    GENERAL:alert, no distress and  comfortable SKIN: no rash to exposed skin  EYES:  sclera clear  LUNGS: clear with normal breathing effort HEART: regular rate & rhythm, no lower extremity edema ABDOMEN: abdomen soft, non-tender and normal bowel sounds NEURO: alert & oriented x 3 with fluent speech, steady gait with cane PAC without erythema   LABORATORY DATA:  I have reviewed the data as listed CBC Latest Ref Rng & Units 08/23/2019 08/09/2019 07/26/2019  WBC 4.0 - 10.5 K/uL 6.7 5.8 5.2  Hemoglobin 12.0 - 15.0 g/dL 8.6(L) 7.5(L) 8.2(L)  Hematocrit 36.0 - 46.0 % 27.1(L) 23.8(L) 26.6(L)  Platelets 150 - 400 K/uL 176 218 200     CMP Latest Ref Rng & Units 08/23/2019 08/09/2019 07/26/2019  Glucose 70 - 99 mg/dL 208(H) 97 123(H)  BUN 8 - 23 mg/dL _0 Creatinine 0.44 - 1.00 mg/dL 1.56(H) 1.26(H) 1.40(H)  Sodium 135 - 145 mmol/L 144 143 143  Potassium 3.5 - 5.1 mmol/L 3.8 3.8 3.7  Chloride 98 - 111 mmol/L 106 108 106  CO2 22 - 32 mmol/L _1 Calcium 8.9 - 10.3 mg/dL 9.0 8.5(L) 8.6(L)  Total Protein 6.5 - 8.1 g/dL 6.8 6.6 6.9  Total Bilirubin 0.3 - 1.2 mg/dL 0.4 0.3 0.4  Alkaline Phos 38 - 126 U/L 45 48 57  AST 15 - 41 U/L _2 ALT 0 - 44 U/L _3 RADIOGRAPHIC STUDIES: I have personally reviewed the radiological images as listed and agreed with the findings in the report. No results found.   ASSESSMENT & PLAN: LIDYA MCCALISTER [id number]   1.Intrahepatic cholangiocarcinoma, cT1N0M1,with peritoneal metastasis, MSS, IDH1 mutation (+) -Diagnosed in 09/2018 biopsy of her liver mass shows adenocarcinoma,most consistent withcholangiocarcinoma -09/29/18 PET scanshowedno evidence of distant metastasis. -Unfortunately she was found to have peritoneal metastasis to right diaphragm and surgery was aborted. -HerCT AP from 9/15/20shows dominate liver mass stable to mild increase, mild nodularity of both adrenal glands which warrants being monitored.No visible peritoneal metastasis on CT  scan. -Given her metastatic cancer and ineligibility forsurgery,she began first-line systemic chemotherapy for disease control. -FO resultsshowed MSI stable disease, IDH mutation  positive.Consider IDH 1 inhibitor in the future.Not a candidate for immunotherapy -She beganfirst lineoxaliplatin(due to CKD)and gemcitabine every 2 weeks on 11/30/2018 -she responded very well to treatment and tolerated without significant toxicities except progressive neuropathy. Oxali was dose reduced and eventually stopped after cycle 11. She received single agent gemcitabine with cycle 12. Cisplatin added with C13 on 05/17/19 but neuropathy worsened and stopped on 06/14/19.  -She has been receiving single agent gemcitabine every 2 weeks starting from 06/28/19 C16 -Ms. Harper appears stable. She completed 19th cycle of treatment, gem only. She tolerates chemo well except neuropathy which is stable for now. She ambulates with a cane and is undergoing PT for balance. She will see Dr. Mickeal Skinner on 08/28/19 for further evaluation and management.  -CBC and CMP are stable except progressive renal dysfunction. I recommend to increase oral hydration at home. She will hold HCTZ and raloxifene for now to see if renal function improves. She will receive IVF with chemo today.  -Proceed with cycle 20 gemcitabine at current dose.  -F/u and next cycle in 2 weeks. Restaging PET in July.   2. H/o right breast cancer, Geneticsnegative -s/p mastectomy in 1992, per patient did not require adjuvant therapy  -previouslyfollowed by Dr. Jana Hakim -VUS of gene APC;genetics otherwise normal  3.Comorbidities:DM, HTN, hiatal hernia, GERD, renal artery stenosis -Follow-up with PCP, nephrology, and Dr. Dwyane Dee -Crfluctuates, overall much improved fromJune- Sept this year (1.78 at highest). -I encouraged her to hydrate and avoid nephrotoxic agents. -renal function fluctuates, slightly worse lately. Likely related to DM, HTN, chemo.    -Will hydrate, hold HCTZ and raloxifene for now, and monitor closely   4. Family support  -she has a son and daughterwho are supportive. -patient takes pride in remaining independent, but has become more reluctant to drive due to CIPN in her feet  5.Goal of care discussion  -Wefrequently discussthe goal of her treatment is palliative and that hercancer is not curable with chemo alone at this stage. The goal is to control her disease, prolong her life, and give her good quality of life. She understands -she is full code now  6. CIPN, G2-3 -secondary to Oxaliplatin, dose was reduced and eventually dc'd after cycle 11 -started cisplatin on 05/17/19, neuropathy worsened in her feet with unsteady gait and functional limitations, decreased vibratory sense on tuning fork exam 05/31/19. Cisplatin was dose reduced then eventually stopped on 06/14/19.  -low efficacy of gabapentin and cymbalta. Currently undergoing PT for balance. Ambulates with cane to avoid fall -She has been referred to Dr. Mickeal Skinner for further evaluation and management.   Plan -Labs reviewed -Proceed with single agent gemcitabine today as planned -1 L NS over 2 hours today -Hold HCTZ and evista due to renal dysfunction -F/u and next cycle in 2 weeks  -Consult with Dr. Mickeal Skinner next week for CIPN -Restaging PET in 3-4 weeks  -Message to Dietician today for lactose-free dietary supplements   All questions were answered. The patient knows to call the clinic with any problems, questions or concerns. No barriers to learning were detected.     Alla Feeling, NP 08/23/19

## 2019-08-23 ENCOUNTER — Telehealth: Payer: Self-pay | Admitting: Nurse Practitioner

## 2019-08-23 ENCOUNTER — Inpatient Hospital Stay: Payer: Medicare Other

## 2019-08-23 ENCOUNTER — Encounter: Payer: Self-pay | Admitting: Nurse Practitioner

## 2019-08-23 ENCOUNTER — Inpatient Hospital Stay: Payer: Medicare Other | Attending: Adult Health

## 2019-08-23 ENCOUNTER — Other Ambulatory Visit: Payer: Self-pay

## 2019-08-23 ENCOUNTER — Inpatient Hospital Stay (HOSPITAL_BASED_OUTPATIENT_CLINIC_OR_DEPARTMENT_OTHER): Payer: Medicare Other | Admitting: Nurse Practitioner

## 2019-08-23 VITALS — BP 158/64 | HR 73 | Temp 97.8°F | Resp 18 | Ht 65.0 in | Wt 129.9 lb

## 2019-08-23 DIAGNOSIS — N183 Chronic kidney disease, stage 3 unspecified: Secondary | ICD-10-CM | POA: Insufficient documentation

## 2019-08-23 DIAGNOSIS — Z79899 Other long term (current) drug therapy: Secondary | ICD-10-CM | POA: Diagnosis not present

## 2019-08-23 DIAGNOSIS — C221 Intrahepatic bile duct carcinoma: Secondary | ICD-10-CM

## 2019-08-23 DIAGNOSIS — Z87891 Personal history of nicotine dependence: Secondary | ICD-10-CM | POA: Insufficient documentation

## 2019-08-23 DIAGNOSIS — Z7984 Long term (current) use of oral hypoglycemic drugs: Secondary | ICD-10-CM | POA: Insufficient documentation

## 2019-08-23 DIAGNOSIS — Z95828 Presence of other vascular implants and grafts: Secondary | ICD-10-CM

## 2019-08-23 DIAGNOSIS — I129 Hypertensive chronic kidney disease with stage 1 through stage 4 chronic kidney disease, or unspecified chronic kidney disease: Secondary | ICD-10-CM | POA: Insufficient documentation

## 2019-08-23 DIAGNOSIS — C786 Secondary malignant neoplasm of retroperitoneum and peritoneum: Secondary | ICD-10-CM | POA: Insufficient documentation

## 2019-08-23 DIAGNOSIS — E1122 Type 2 diabetes mellitus with diabetic chronic kidney disease: Secondary | ICD-10-CM | POA: Insufficient documentation

## 2019-08-23 DIAGNOSIS — Z5111 Encounter for antineoplastic chemotherapy: Secondary | ICD-10-CM | POA: Diagnosis present

## 2019-08-23 LAB — CBC WITH DIFFERENTIAL (CANCER CENTER ONLY)
Abs Immature Granulocytes: 0.03 10*3/uL (ref 0.00–0.07)
Basophils Absolute: 0 10*3/uL (ref 0.0–0.1)
Basophils Relative: 0 %
Eosinophils Absolute: 0.1 10*3/uL (ref 0.0–0.5)
Eosinophils Relative: 1 %
HCT: 27.1 % — ABNORMAL LOW (ref 36.0–46.0)
Hemoglobin: 8.6 g/dL — ABNORMAL LOW (ref 12.0–15.0)
Immature Granulocytes: 0 %
Lymphocytes Relative: 13 %
Lymphs Abs: 0.9 10*3/uL (ref 0.7–4.0)
MCH: 31.2 pg (ref 26.0–34.0)
MCHC: 31.7 g/dL (ref 30.0–36.0)
MCV: 98.2 fL (ref 80.0–100.0)
Monocytes Absolute: 1 10*3/uL (ref 0.1–1.0)
Monocytes Relative: 14 %
Neutro Abs: 4.8 10*3/uL (ref 1.7–7.7)
Neutrophils Relative %: 72 %
Platelet Count: 176 10*3/uL (ref 150–400)
RBC: 2.76 MIL/uL — ABNORMAL LOW (ref 3.87–5.11)
RDW: 19.9 % — ABNORMAL HIGH (ref 11.5–15.5)
WBC Count: 6.7 10*3/uL (ref 4.0–10.5)
nRBC: 0 % (ref 0.0–0.2)

## 2019-08-23 LAB — CMP (CANCER CENTER ONLY)
ALT: 8 U/L (ref 0–44)
AST: 24 U/L (ref 15–41)
Albumin: 3.3 g/dL — ABNORMAL LOW (ref 3.5–5.0)
Alkaline Phosphatase: 45 U/L (ref 38–126)
Anion gap: 15 (ref 5–15)
BUN: 21 mg/dL (ref 8–23)
CO2: 23 mmol/L (ref 22–32)
Calcium: 9 mg/dL (ref 8.9–10.3)
Chloride: 106 mmol/L (ref 98–111)
Creatinine: 1.56 mg/dL — ABNORMAL HIGH (ref 0.44–1.00)
GFR, Est AFR Am: 36 mL/min — ABNORMAL LOW (ref >60)
GFR, Estimated: 31 mL/min — ABNORMAL LOW (ref >60)
Glucose, Bld: 208 mg/dL — ABNORMAL HIGH (ref 70–99)
Potassium: 3.8 mmol/L (ref 3.5–5.1)
Sodium: 144 mmol/L (ref 135–145)
Total Bilirubin: 0.4 mg/dL (ref 0.3–1.2)
Total Protein: 6.8 g/dL (ref 6.5–8.1)

## 2019-08-23 LAB — MAGNESIUM: Magnesium: 2.2 mg/dL (ref 1.7–2.4)

## 2019-08-23 MED ORDER — HEPARIN SOD (PORK) LOCK FLUSH 100 UNIT/ML IV SOLN
500.0000 [IU] | Freq: Once | INTRAVENOUS | Status: AC | PRN
  Administered 2019-08-23: 500 [IU]
  Filled 2019-08-23: qty 5

## 2019-08-23 MED ORDER — PROCHLORPERAZINE MALEATE 10 MG PO TABS
10.0000 mg | ORAL_TABLET | Freq: Once | ORAL | Status: AC
  Administered 2019-08-23: 10 mg via ORAL

## 2019-08-23 MED ORDER — SODIUM CHLORIDE 0.9 % IV SOLN
Freq: Once | INTRAVENOUS | Status: DC
  Filled 2019-08-23: qty 250

## 2019-08-23 MED ORDER — SODIUM CHLORIDE 0.9% FLUSH
10.0000 mL | Freq: Once | INTRAVENOUS | Status: AC
  Administered 2019-08-23: 10 mL
  Filled 2019-08-23: qty 10

## 2019-08-23 MED ORDER — PROCHLORPERAZINE MALEATE 10 MG PO TABS
ORAL_TABLET | ORAL | Status: AC
  Filled 2019-08-23: qty 1

## 2019-08-23 MED ORDER — SODIUM CHLORIDE 0.9% FLUSH
10.0000 mL | INTRAVENOUS | Status: DC | PRN
  Administered 2019-08-23: 10 mL
  Filled 2019-08-23: qty 10

## 2019-08-23 MED ORDER — SODIUM CHLORIDE 0.9 % IV SOLN
1330.0000 mg | Freq: Once | INTRAVENOUS | Status: AC
  Administered 2019-08-23: 1330 mg via INTRAVENOUS
  Filled 2019-08-23: qty 34.98

## 2019-08-23 MED ORDER — SODIUM CHLORIDE 0.9 % IV SOLN
Freq: Once | INTRAVENOUS | Status: AC
  Filled 2019-08-23: qty 250

## 2019-08-23 NOTE — Progress Notes (Signed)
Nutrition  Secure message received from [facility], NP regarding lactose free options, unable to tolerate ensure.   Met with patient in infusion and provided samples of boost soothe, ensure clear, orgain vegan shake, [employer] shake.  Patient reports ensure caused diarrhea. Does report that boost shake does not cause diarrhea.    Next visit: June 23rd  Siobhan Zaro B. Zenia Resides, [place], [place] Registered Dietitian [phone] (pager)

## 2019-08-23 NOTE — Patient Instructions (Signed)
[facility] Discharge Instructions for Patients Receiving Chemotherapy  Today you received the following chemotherapy agents: Gemzar  To help prevent nausea and vomiting after your treatment, we encourage you to take your nausea medication as prescribed.    If you develop nausea and vomiting that is not controlled by your nausea medication, call the clinic.   BELOW ARE SYMPTOMS THAT SHOULD BE REPORTED IMMEDIATELY:  *FEVER GREATER THAN 100.5 F  *CHILLS WITH OR WITHOUT FEVER  NAUSEA AND VOMITING THAT IS NOT CONTROLLED WITH YOUR NAUSEA MEDICATION  *UNUSUAL SHORTNESS OF BREATH  *UNUSUAL BRUISING OR BLEEDING  TENDERNESS IN MOUTH AND THROAT WITH OR WITHOUT PRESENCE OF ULCERS  *URINARY PROBLEMS  *BOWEL PROBLEMS  UNUSUAL RASH Items with * indicate a potential emergency and should be followed up as soon as possible.  Feel free to call the clinic should you have any questions or concerns. The clinic phone number is [phone].  Please show the [facility] at check-in to the Emergency Department and triage nurse.     Blood Transfusion, Adult, Care After This sheet gives you information about how to care for yourself after your procedure. Your doctor may also give you more specific instructions. If you have problems or questions, contact your doctor. What can I expect after the procedure? After the procedure, it is common to have:  Bruising and soreness at the IV site.  A fever or chills on the day of the procedure. This may be your body's response to the new blood cells received.  A headache. Follow these instructions at home: Insertion site care      Follow instructions from your doctor about how to take care of your insertion site. This is where an IV tube was put into your vein. Make sure you: ? Wash your hands with soap and water before and after you change your bandage (dressing). If you cannot use soap and water, use hand sanitizer. ? Change  your bandage as told by your doctor.  Check your insertion site every day for signs of infection. Check for: ? Redness, swelling, or pain. ? Bleeding from the site. ? Warmth. ? Pus or a bad smell. General instructions  Take over-the-counter and prescription medicines only as told by your doctor.  Rest as told by your doctor.  Go back to your normal activities as told by your doctor.  Keep all follow-up visits as told by your doctor. This is important. Contact a doctor if:  You have itching or red, swollen areas of skin (hives).  You feel worried or nervous (anxious).  You feel weak after doing your normal activities.  You have redness, swelling, warmth, or pain around the insertion site.  You have blood coming from the insertion site, and the blood does not stop with pressure.  You have pus or a bad smell coming from the insertion site. Get help right away if:  You have signs of a serious reaction. This may be coming from an allergy or the body's defense system (immune system). Signs include: ? Trouble breathing or shortness of breath. ? Swelling of the face or feeling warm (flushed). ? Fever or chills. ? Head, chest, or back pain. ? Dark pee (urine) or blood in the pee. ? Widespread rash. ? Fast heartbeat. ? Feeling dizzy or light-headed. You may receive your blood transfusion in an outpatient setting. If so, you will be told whom to contact to report any reactions. These symptoms may be an emergency. Do not  wait to see if the symptoms will go away. Get medical help right away. Call your local emergency services (911 in the [place] Do not drive yourself to the hospital. Summary  Bruising and soreness at the IV site are common.  Check your insertion site every day for signs of infection.  Rest as told by your doctor. Go back to your normal activities as told by your doctor.  Get help right away if you have signs of a serious reaction. This information is not intended  to replace advice given to you by your health care provider. Make sure you discuss any questions you have with your health care provider. Document Revised: 08/25/2018 Document Reviewed: 08/25/2018 Elsevier Patient Education  [place].

## 2019-08-23 NOTE — Telephone Encounter (Signed)
Scheduled per 6/9 los. Noted to give pt updated calendar.

## 2019-08-23 NOTE — Progress Notes (Signed)
Per Regan Rakers, OK to treat with creatinine of 1.56

## 2019-08-24 ENCOUNTER — Telehealth: Payer: Self-pay

## 2019-08-24 ENCOUNTER — Ambulatory Visit: Payer: Medicare Other

## 2019-08-24 DIAGNOSIS — R262 Difficulty in walking, not elsewhere classified: Secondary | ICD-10-CM

## 2019-08-24 DIAGNOSIS — R209 Unspecified disturbances of skin sensation: Secondary | ICD-10-CM

## 2019-08-24 DIAGNOSIS — M6281 Muscle weakness (generalized): Secondary | ICD-10-CM

## 2019-08-24 NOTE — Therapy (Signed)
[place], [place], [address] Phone: [phone]   Fax:  [phone]  Physical Therapy Treatment  Patient Details  Name: Donna May MRN: [id number] Date of Birth: 08-03-1940 Referring Provider (PT): Dr. Burr Medico    Encounter Date: 08/24/2019   PT End of Session - 08/24/19 1057    Visit Number 6    Number of Visits 17    Date for PT Re-Evaluation 09/18/19    PT Start Time 1006    PT Stop Time 1105    PT Time Calculation (min) 59 min    Activity Tolerance Patient tolerated treatment well    Behavior During Therapy [facility] for tasks assessed/performed           Past Medical History:  Diagnosis Date  . Anemia   . Anxiety   . Arthritis   . Breast cancer ([facility]) 1992  . Cataract    Bilateral eyes - surgery to remove  . Chronic kidney disease    CKD stage 3  . Complication of anesthesia    "something they use make me itch for a couple of days."  . Depression   . Diabetes mellitus    type 2  . Duodenitis 01/18/2002  . Fainting spell   . Family history of breast cancer   . Family history of prostate cancer   . GERD (gastroesophageal reflux disease)   . Heart murmur    never has caused any problems  . Hiatal hernia 08/08/2008, 01/18/2002  . History of pneumonia    x 2  . Hyperlipidemia   . Hypertension   . Liver cancer ([facility]) 08/2018  . Lymphedema 2017   Right arm  . Pneumonia    x 2  . UTI (lower urinary tract infection)     Past Surgical History:  Procedure Laterality Date  . AXILLARY SURGERY     cyst removal, right  . COLONOSCOPY  09/23/2018   Dr. Havery Moros - polyps  . EYE SURGERY Bilateral    cataracts to remove  . LAPAROSCOPY N/A 11/17/2018   Procedure: LAPAROSCOPY DIAGNOSTIC, INTRAOPERATIVE ULTRASOUND, PERITONEAL BIOPSIES;  Surgeon: [name], MD;  Location: [facility];  Service: General;  Laterality: N/A;  GENERAL AND EPIDURAL  . LIVER BIOPSY  08/2018   Dr. Lindwood Coke  . MASTECTOMY   1992   right, with flap  . NM MYOCAR PERF WALL MOTION  06/11/2009   Protocol:[place], post stress EF58%, EKG negative for ischemia, low risk  . PORTACATH PLACEMENT N/A 12/02/2018   Procedure: INSERTION PORT-A-CATH;  Surgeon: [name], MD;  Location: [facility];  Service: General;  Laterality: N/A;  . RECONSTRUCTION BREAST W/ TRAM FLAP Right   . TONSILLECTOMY    . TRANSTHORACIC ECHOCARDIOGRAM  12/24/2009   LVEF =>55%, normal study  . UPPER GI ENDOSCOPY      There were no vitals filed for this visit.   Subjective Assessment - 08/24/19 1010    Subjective I had my infusion yesterday so I am really sluggish today. I see the neurologist on 08/28/19.    Pertinent History Pt with Intrahepaticholangiocarcinoma with metastasis.  She was treated with chemotherapy and developed neuropathy in hands and feet. She is on an ongoing maintenance chemo every other week.  Past history includes breast cancer in 1992 with ALND and had problems with lymphedema in 2017.  She says she has a sleeve but she doesn't wear it  that much History includes chronic kidney disease and diabetes type II  Patient Stated Goals "to be able to be doing sonething for myself"    Currently in Pain? No/denies   constant tingling in hands and feet                            [facility] Adult PT Treatment/Exercise - 08/24/19 0001      Neuro Re-ed    Neuro Re-ed Details  Standing at counter: Bil tandem stance practice x1 min each, then bil SLS practice x30 sec each with fingertip to no support throughout for each, tandem stance more challenging      Knee/Hip Exercises: Standing   Heel Raises Both;2 sets;10 reps   heel and toe raises   Hip Flexion Stengthening;Right;Left;10 reps;Knee straight   1# on ankles standing at counter for +1 HHA   Hip Abduction Stengthening;Right;Left;10 reps;Knee straight   1# ankles at counter for +2 HHA   Hip Extension Stengthening;Right;Left;10 reps;Knee straight   1# on ankles at counter for  +2 HHA   Extension Limitations VCs to decrease forward trunk lean      Knee/Hip Exercises: Seated   Long Arc Quad Strengthening;Right;Left;10 reps;Weights    Long Arc Quad Weight 1 lbs.    Long Arc [employer] Limitations Pt returned Environmental health practitioner Strengthening;Right;Left;20 reps;Weights    Marching Limitations Pt returning Financial trader Weights 1 lbs.      Knee/Hip Exercises: Supine   Short Arc Quad Sets Strengthening;Right;Left;Other (comment)   30 reps with 1 lb on ankles   Bridges Strengthening;Both;15 reps    Bridges Limitations VCs for full ROM    Straight Leg Raises Strengthening;Right;Left;2 sets;10 reps   1 lb on ankles   Straight Leg Raises Limitations VCs for slow, controlled ROM concentrically and eccentrically      Shoulder Exercises: Seated   Flexion Strengthening;Both;20 reps;Weights    Flexion Weight (lbs) 1    Flexion Limitations limited ROM due to limitations of Rt shoulder    Other Seated Exercises Bicep curls with 1# x10, then x15                     PT Short Term Goals - 07/18/19 1721      PT SHORT TERM GOAL #1   Title Pt will increase reptitions of sit to stand in 30 seconds to 8 indicating an improvment in functional strength    Baseline 5 reps in 30 sec on 07/18/219    Time 4    Period Weeks    Status New      PT SHORT TERM GOAL #2   Title Pt will report she has noticed mild improvement in her ability to carry out her tasks at home    Time 4    Period Weeks    Status New      PT SHORT TERM GOAL #3   Title Pt will decrease TUG score to < 10 seconds indicting an improvemet in functional mobility    Baseline 11.49 sec    Time 4    Period Weeks    Status New             PT Long Term Goals - 07/18/19 1723      PT LONG TERM GOAL #1   Title Pt will increase reptitions of sit to stand in 30 seconds to 10 indicating an improvment in functional strength    Baseline 5 on 07/18/2019  Time 8    Period Weeks    Status  New      PT LONG TERM GOAL #2   Title Pt will report she has noticed moderate mprovement in her ability to carry out her tasks at home    Time 8    Period Weeks    Status New      PT LONG TERM GOAL #3   Title Pt will decrease TUG score to < 10 seconds indicting an improvemet in functional mobility    Baseline 11.49 on 07/18/2019    Time 8    Period Weeks    Status New                 Plan - 08/24/19 1058    Clinical Impression Statement Focused on more mat and seated activities with some standing as pt was fatigued from her infusion yesterday. But she was able to tolerate all exercises well and demonstrated less fatigue by end of session.    Personal Factors and Comorbidities Comorbidity 3+;Past/Current Experience;Age    Comorbidities previous breast cancer with lymphedema in her right arm, ongoing chemo. DM    Examination-Activity Limitations Reach Overhead;Locomotion Level;Stand;Stairs;Squat;Dressing;Bend    Examination-Participation Restrictions Community Activity;Meal Prep;Cleaning;Shop;Laundry    Stability/Clinical Decision Making Evolving/Moderate complexity    Rehab Potential Good    Clinical Impairments Affecting Rehab Potential history of right axillary lymph node dissection (in 1992), chemotherapy    PT Frequency 2x / week    PT Duration 8 weeks    PT Treatment/Interventions ADLs/Self Care Home Management;Therapeutic exercise;DME Instruction;Patient/family education;Manual techniques;Manual lymph drainage;Compression bandaging;Passive range of motion;Taping;Aquatic Therapy;Gait training;Stair training;Balance training;Therapeutic activities;Functional mobility training    PT Next Visit Plan Cont mat exercise for LE and core strength. progress balance activities as able; add balance HEP prn    Consulted and Agree with Plan of Care Patient           Patient will benefit from skilled therapeutic intervention in order to improve the following deficits and impairments:   Abnormal gait, Decreased balance, Decreased endurance, Difficulty walking, Impaired sensation, Impaired perceived functional ability, Increased edema, Decreased knowledge of precautions, Decreased range of motion, Decreased activity tolerance, Decreased strength, Postural dysfunction, Pain  Visit Diagnosis: Difficulty in walking, not elsewhere classified  Muscle weakness (generalized)  Unspecified disturbances of skin sensation     Problem List Patient Active Problem List   Diagnosis Date Noted  . Port-A-Cath in place 12/14/2018  . Goals of care, counseling/discussion 11/24/2018  . Status post laparoscopy 11/17/2018  . Genetic testing 11/07/2018  . Family history of prostate cancer   . Family history of breast cancer   . Intrahepatic cholangiocarcinoma ([place]) 09/20/2018  . Coronary artery calcification 04/20/2016  . Ductal carcinoma in situ (DCIS) of right breast 11/26/2015  . Lymphedema of arm 11/26/2015  . Lymphedema 11/01/2015  . Leg pain, bilateral 12/28/2013  . Special screening for malignant neoplasms, colon 08/09/2013  . Renal artery stenosis, native, bilateral ([place] [facility]) 06/09/2013  . Diabetes mellitus with renal complications ([place]) 74/02/8785  . RASH AND OTHER NONSPECIFIC SKIN ERUPTION 07/05/2008  . Malignant neoplasm of female breast ([place]) 07/04/2008  . HYPERCHOLESTEROLEMIA 07/04/2008  . Essential hypertension 07/04/2008  . GERD 07/04/2008  . DUODENITIS, WITHOUT HEMORRHAGE 07/04/2008  . HIATAL HERNIA 07/04/2008  . Arthropathy 07/04/2008    Otelia Limes, PTA 08/24/2019, 11:12 AM  [place], [place], [address] Phone: [phone]   Fax:  [phone]  Name: Brent  SHASTA CHINN MRN: [id number] Date of Birth: 1940/04/03

## 2019-08-24 NOTE — Telephone Encounter (Signed)
TC per Cira Rue NP to schedule patients PET scan. Spoke with Donna May and PET scan is scheduled for Friday 09/15/19 @ 8:00am here at [facility], patient is to arrive by 7:30am, have nothing to eat or drink 6 hours prior to appointment but can have sips of water. Patient made aware of appointment date, time, location, and instructions.

## 2019-08-28 ENCOUNTER — Other Ambulatory Visit: Payer: Self-pay

## 2019-08-28 ENCOUNTER — Inpatient Hospital Stay (HOSPITAL_BASED_OUTPATIENT_CLINIC_OR_DEPARTMENT_OTHER): Payer: Medicare Other | Admitting: Internal Medicine

## 2019-08-28 DIAGNOSIS — G620 Drug-induced polyneuropathy: Secondary | ICD-10-CM

## 2019-08-28 DIAGNOSIS — T451X5A Adverse effect of antineoplastic and immunosuppressive drugs, initial encounter: Secondary | ICD-10-CM | POA: Diagnosis not present

## 2019-08-28 DIAGNOSIS — Z5111 Encounter for antineoplastic chemotherapy: Secondary | ICD-10-CM | POA: Diagnosis not present

## 2019-08-28 MED ORDER — PREGABALIN 75 MG PO CAPS
75.0000 mg | ORAL_CAPSULE | Freq: Two times a day (BID) | ORAL | 1 refills | Status: DC
Start: 2019-08-28 — End: 2019-10-04

## 2019-08-28 NOTE — Progress Notes (Signed)
[facility] [place], [place] [address] [phone]   New Patient Evaluation  Date of Service: 08/28/19 Patient Name: Donna May Patient MRN: [id number] Patient DOB: Dec 05, 1940 Provider: Ventura Sellers, MD  Identifying Statement:  Donna May is a 79 y.o. female with peripheral neuropathy who presents for initial consultation and evaluation regarding cancer associated neurologic deficits.    Referring Provider: [name], MD [address] [place],  [place] [address]  Primary Cancer:  Oncologic History: Oncology History  Malignant neoplasm of female breast ([place])  11/06/2018 Genetic Testing   Negative genetic testing on the [facility] Common Hereditary Cancers panel. A variant of uncertain significance was identified in one of her APC genes, called c.1243G>A (p.Ala415Thr).  The Common Hereditary Cancers Panel offered by [facility] includes sequencing and/or deletion duplication testing of the following 48 genes: APC, ATM, AXIN2, BARD1, BMPR1A, BRCA1, BRCA2, BRIP1, CDH1, CDK4, CDKN2A (p14ARF), CDKN2A (p16INK4a), CHEK2, CTNNA1, DICER1, EPCAM (Deletion/duplication testing only), GREM1 (promoter region deletion/duplication testing only), KIT, MEN1, MLH1, MSH2, MSH3, MSH6, MUTYH, NBN, NF1, NHTL1, PALB2, PDGFRA, PMS2, POLD1, POLE, PTEN, RAD50, RAD51C, RAD51D, RNF43, SDHB, SDHC, SDHD, SMAD4, SMARCA4. STK11, TP53, TSC1, TSC2, and VHL.  The following genes were evaluated for sequence changes only: SDHA and HOXB13 c.251G>A variant only.    Intrahepatic cholangiocarcinoma ([place])  09/07/2018 Imaging   CT Chest IMPRESSION: 1. New, enhancing mass involving segment 4 of the liver and fundus of gallbladder is concerning for malignancy. This may represent either metastatic disease from breast cancer or neoplasm primary to the liver or hepatic biliary tree. Further evaluation with contrast enhanced CT of the abdomen and pelvis is  recommended. 2. No findings to suggest metastatic disease within the chest. 3.  Aortic Atherosclerosis (ICD10-I70.0). 4. Coronary artery calcifications.   09/13/2018 Pathology Results   Diagnosis Liver, needle/core biopsy - ADENOCARCINOMA. Microscopic Comment Immunohistochemistry for CK7 is positive. CK20, TTF1, CDX-2, GATA-3, PAX 8, Qualitative ER, p63 and CK5/6 are negative. The provided clinical history of remote mammary carcinoma is noted. Based on the morphology and immunophenotype of the adenocarcinoma observed in this specimen, primary cholangiocarcinoma is favored. Clinical and radiologic correlation are  encouraged. Results reported to [employer] on 09/15/2018. Intradepartmental consultation (Dr. Vic Ripper).   09/13/2018 Initial Diagnosis   Cholangiocarcinoma ([place])   09/23/2018 Procedure   Colonoscopy by Dr. Havery Moros 09/23/18  IMPRESSION - Two 3 to 4 mm polyps in the ascending colon, removed with a cold snare. Resected and retrieved. - Five 3 to 5 mm polyps in the transverse colon, removed with a cold snare. Resected and retrieved. - One 5 mm polyp at the splenic flexure, removed with a cold snare. Resected and retrieved. - Three 3 to 5 mm polyps in the sigmoid colon, removed with a cold snare. Resected and retrieved. - The examination was otherwise normal. Upper Endopscy by Dr. Havery Moros 09/23/18  IMPRESSION - Esophagogastric landmarks identified. - 2 cm hiatal hernia. - Normal esophagus otherwise. - A single gastric polyp. Resected and retrieved. - Mild gastritis. Biopsied. - Normal duodenal bulb and second portion of the duodenum.   09/23/2018 Pathology Results   Diagnosis 09/23/18 1. Surgical [P], duodenum - BENIGN SMALL BOWEL MUCOSA. - NO ACTIVE INFLAMMATION OR VILLOUS ATROPHY IDENTIFIED. 2. Surgical [P], stomach, polyp - HYPERPLASTIC POLYP(S). - THERE IS NO EVIDENCE OF MALIGNANCY. 3. Surgical [P], gastric antrum and gastric body - CHRONIC INACTIVE  GASTRITIS. - THERE IS NO EVIDENCE OF HELICOBACTER-PYLORI, DYSPLASIA, OR MALIGNANCY. -  SEE COMMENT. 4. Surgical [P], colon, sigmoid, splenic flexure, transverse and ascending, polyp (9) - TUBULAR ADENOMA(S). - SESSILE SERRATED POLYP WITHOUT CYTOLOGIC DYSPLASIA. - HIGH GRADE DYSPLASIA IS NOT IDENTIFIED. 5. Surgical [P], colon, sigmoid, polyp (2) - HYPERPLASTIC POLYP(S). - THERE IS NO EVIDENCE OF MALIGNANCY.   09/23/2018 Cancer Staging   Staging form: Intrahepatic Bile Duct, AJCC 8th Edition - Clinical stage from 09/23/2018: Stage IB (cT1b, cN0, cM0) - Signed by [name], MD on 10/06/2018   09/29/2018 PET scan   PET 09/29/18 IMPRESSION: 1. Hypermetabolic mass in the RIGHT hepatic lobe consistent with biopsy proven adenocarcinoma. No additional liver metastasis. 2. No evidence of local breast cancer recurrence in the RIGHT breast or RIGHT axilla. 3. Mild bilateral hypermetabolic adrenal glands is favored benign hyperplasia. 4. No evidence of additional metastatic disease on skull base to thigh FDG PET scan.   11/06/2018 Genetic Testing   Negative genetic testing on the [facility] Common Hereditary Cancers panel. A variant of uncertain significance was identified in one of her APC genes, called c.1243G>A (p.Ala415Thr).  The Common Hereditary Cancers Panel offered by [facility] includes sequencing and/or deletion duplication testing of the following 48 genes: APC, ATM, AXIN2, BARD1, BMPR1A, BRCA1, BRCA2, BRIP1, CDH1, CDK4, CDKN2A (p14ARF), CDKN2A (p16INK4a), CHEK2, CTNNA1, DICER1, EPCAM (Deletion/duplication testing only), GREM1 (promoter region deletion/duplication testing only), KIT, MEN1, MLH1, MSH2, MSH3, MSH6, MUTYH, NBN, NF1, NHTL1, PALB2, PDGFRA, PMS2, POLD1, POLE, PTEN, RAD50, RAD51C, RAD51D, RNF43, SDHB, SDHC, SDHD, SMAD4, SMARCA4. STK11, TP53, TSC1, TSC2, and VHL.  The following genes were evaluated for sequence changes only: SDHA and HOXB13 c.251G>A variant only.    11/17/2018 Pathology  Results   Diagnosis 11/17/18 1. Soft tissue, biopsy, Diaphragmatic nodules - METASTATIC ADENOCARCINOMA, CONSISTENT WITH PATIENT'S CLINICAL HISTORY OF CHOLANGIOCARCINOMA. SEE NOTE 2. Liver, biopsy, Left - LIVER PARENCHYMA WITH A BENIGN FIBROTIC NODULE - NO EVIDENCE OF MALIGNANCY 3. Stomach, biopsy - BENIGN PAPILLARY MESOTHELIAL HYPERPLASIA - NO EVIDENCE OF MALIGNANCY   11/29/2018 Imaging   CT CAP WO Contrast  IMPRESSION: 1. Dominant liver mass appears grossly stable from 09/29/2018. Additional liver lesions are too small to characterize but were not shown to be hypermetabolic on PET. 2. Mild nodularity of both adrenal glands with associated hypermetabolism on 09/29/2018. Continued attention on follow-up exams is warranted. 3. Small right lower lobe nodules, stable from 09/07/2018. Again, attention on follow-up is recommended. 4. Trace bilateral pleural fluid. 5. Aortic atherosclerosis (ICD10-170.0). Coronary artery calcification. 6. Enlarged pulmonic trunk, indicative of pulmonary arterial hypertension.     11/30/2018 - 06/14/2019 Chemotherapy   First line chemo Oxaliplatin and gemcitabine q2weeks starting 11/30/18. Stopped Oxaliplatin on 05/03/19 due to worsening neuropathy. Added Cisplatin with C13 on 05/17/19 and stopped on C15 06/14/19 due to neuropathy. Reduced to maintenance single agent Gemcitabine on 06/28/19.    01/20/2019 Imaging   CT CAP IMPRESSION: restaging  1. Mild interval increase in size of dominant liver mass involving segment 4 and segment 5. 2. No significant or progressive adrenal nodularity identified to suggest metastatic disease. 3. Unchanged appearance of small right lower lobe and lingular lung nodules, nonspecific.   03/21/2019 PET scan   IMPRESSION: 1. Large right hepatic mass with SUV uptake near background hepatic activity, dramatic response to therapy, also with decrease in size when compared to the prior study. 2. Signs of prior right mastectomy and  axillary dissection as before. 3. Left adrenal activity in no longer above the level of activity seen in the contralateral, right adrenal gland or in the liver. Attention  on follow-up. 4. No new signs of disease.   06/27/2019 PET scan   IMPRESSION: 1. Further decrease in size of a dominant hepatic mass which is non FDG avid. 2. No evidence of metastatic disease. 3. No evidence of left adrenal hypermetabolism or mass. 4. Incidental findings, including: Uterine fibroids. Coronary artery atherosclerosis. Aortic Atherosclerosis (ICD10-I70.0). Pulmonary artery enlargement suggests pulmonary arterial hypertension.   06/28/2019 -  Chemotherapy   Maintenance single agent Gemcitabine q2weeks  starting on 06/28/19 with C16.      History of Present Illness: The patient's records from the referring physician were obtained and reviewed and the patient interviewed to confirm this HPI.  Donna May presents today for evaluation of neuropathy symptoms.  She describes relatively rapid development of numbness and some burning/tingling pain affecting her feet, legs, hands and even abdomen.  Symptoms began during and following treatment with cisplatin starting 3 months ago.  Cisplatin was discontinued and symptoms have abated somewhat, although numbness in legs and groin makes walking and washing herself very difficult at times.  Pain is less severe than sensory loss.  Cymbalta was ineffective, gabapentin was ineffective and made her very drowsy and confused. Carries concurrent diagnosis of diabetes but didn't have neuropathy symptoms until starting chemotherapy.  Medications: Current Outpatient Medications on File Prior to Visit  Medication Sig Dispense Refill   amLODipine (NORVASC) 10 MG tablet TAKE 1 TABLET BY MOUTH EVERY DAY 90 tablet 3   aspirin 81 MG tablet Take 81 mg by mouth at bedtime.      Calcium Carbonate-Vitamin D 600-200 MG-UNIT TABS Take 1 tablet by mouth daily.      CALCIUM-MAGNESIUM-VITAMIN D PO Take 1 tablet by mouth once a week.      Cholecalciferol (VITAMIN D3) 2000 UNITS TABS Take 1 tablet by mouth once a week.      DULoxetine (CYMBALTA) 30 MG capsule Take 1 capsule (30 mg total) by mouth daily. 90 capsule 1   Echinacea-Goldenseal (ECHINACEA COMB/GOLDEN SEAL) CAPS Take by mouth.     gabapentin (NEURONTIN) 100 MG capsule Take 1-3 capsules (100-300 mg total) by mouth 3 (three) times daily. Take 1 cap twice daily during day and 3 cap at night 360 capsule 1   glucose blood test strip 1 each by Other route 2 (two) times daily. Use Onetouch verio test strips as instructed to check blood sugar twice daily. 100 each 2   hydrochlorothiazide (HYDRODIURIL) 25 MG tablet Take 1 tablet (25 mg total) by mouth daily. 90 tablet 3   HYDROcodone-acetaminophen (NORCO/VICODIN) 5-325 MG tablet Take 1 tablet by mouth every 6 (six) hours as needed for moderate pain. 10 tablet 0   ketoconazole (NIZORAL) 2 % cream Apply 1 fingertip amount to each foot daily. 30 g 0   lansoprazole (PREVACID) 15 MG capsule Take 1 capsule (15 mg total) by mouth daily. 90 capsule 1   lidocaine-prilocaine (EMLA) cream Apply to affected area once 90 g 1   loratadine (CLARITIN) 10 MG tablet Take 10 mg by mouth daily as needed for allergies.     metFORMIN (GLUCOPHAGE-XR) 500 MG 24 hr tablet Take 1 tablet (500 mg total) by mouth 2 (two) times daily before a meal. 180 tablet 1   metoprolol succinate (TOPROL-XL) 100 MG 24 hr tablet Take 1 tablet (100 mg total) by mouth daily. Take with or immediately following a meal. 90 tablet 1   Omega 3 1000 MG CAPS Take 2,000 mg by mouth daily.      ondansetron (ZOFRAN) 8  MG tablet Take 1 tablet (8 mg total) by mouth every 8 (eight) hours as needed for nausea or vomiting. 30 tablet 0   Prenatal Vit-Fe Fumarate-FA (MULTIVITAMIN-PRENATAL) 27-0.8 MG TABS tablet Take 1 tablet by mouth daily at 12 noon.     prochlorperazine (COMPAZINE) 10 MG tablet Take 1  tablet (10 mg total) by mouth every 6 (six) hours as needed for nausea or vomiting. 30 tablet 0   raloxifene (EVISTA) 60 MG tablet TAKE 1 TABLET DAILY 90 tablet 0   rosuvastatin (CRESTOR) 20 MG tablet Take 1 tablet (20 mg total) by mouth daily. 90 tablet 1   sitaGLIPtin (JANUVIA) 100 MG tablet Take 1 tablet (100 mg total) by mouth daily. 90 tablet 4   triamcinolone cream (KENALOG) 0.1 % Apply 1 application topically 2 (two) times daily. 30 g 0   valsartan (DIOVAN) 320 MG tablet Take 1 tablet (320 mg total) by mouth daily. 90 tablet 3   Current Facility-Administered Medications on File Prior to Visit  Medication Dose Route Frequency Provider Last Rate Last Admin   triamcinolone acetonide (KENALOG) 10 MG/ML injection 10 mg  10 mg Other Once Harriet Masson, DPM        Allergies: No Known Allergies Past Medical History:  Past Medical History:  Diagnosis Date   Anemia    Anxiety    Arthritis    Breast cancer ([place]) 1992   Cataract    Bilateral eyes - surgery to remove   Chronic kidney disease    CKD stage 3   Complication of anesthesia    "something they use make me itch for a couple of days."   Depression    Diabetes mellitus    type 2   Duodenitis 01/18/2002   Fainting spell    Family history of breast cancer    Family history of prostate cancer    GERD (gastroesophageal reflux disease)    Heart murmur    never has caused any problems   Hiatal hernia 08/08/2008, 01/18/2002   History of pneumonia    x 2   Hyperlipidemia    Hypertension    Liver cancer ([place]) 08/2018   Lymphedema 2017   Right arm   Pneumonia    x 2   UTI (lower urinary tract infection)    Past Surgical History:  Past Surgical History:  Procedure Laterality Date   AXILLARY SURGERY     cyst removal, right   COLONOSCOPY  09/23/2018   Dr. Havery Moros - polyps   EYE SURGERY Bilateral    cataracts to remove   LAPAROSCOPY N/A 11/17/2018   Procedure: LAPAROSCOPY DIAGNOSTIC,  INTRAOPERATIVE ULTRASOUND, PERITONEAL BIOPSIES;  Surgeon: [name], MD;  Location: [facility];  Service: General;  Laterality: N/A;  GENERAL AND EPIDURAL   LIVER BIOPSY  08/2018   Dr. Lindwood Coke   MASTECTOMY  1992   right, with flap   NM [place]  06/11/2009   Protocol:[place], post stress EF58%, EKG negative for ischemia, low risk   PORTACATH PLACEMENT N/A 12/02/2018   Procedure: INSERTION PORT-A-CATH;  Surgeon: [name], MD;  Location: [facility];  Service: General;  Laterality: N/A;   RECONSTRUCTION BREAST W/ TRAM FLAP Right    TONSILLECTOMY     TRANSTHORACIC ECHOCARDIOGRAM  12/24/2009   LVEF =>55%, normal study   UPPER GI ENDOSCOPY     Social History:  Social History   Socioeconomic History   Marital status: Single    Spouse name: Not on file   Number of  children: 2   Years of education: Not on file   Highest education level: Not on file  Occupational History   Occupation: retired  Tobacco Use   Smoking status: Former Smoker    Packs/day: 0.25    Years: 25.00    Pack years: 6.25    Types: Cigarettes    Quit date: 1990    Years since quitting: 31.4   Smokeless tobacco: Never Used  Scientific laboratory technician Use: Never used  Substance and Sexual Activity   Alcohol use: Not Currently    Alcohol/week: 1.0 standard drink    Types: 1 Glasses of wine per week    Comment: occasional wine   Drug use: No   Sexual activity: Yes    Partners: Male    Birth control/protection: Condom, Post-menopausal    Comment: First sexual encounter age 44. Fewer than 5 partners in life time.  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   Social Determinants of Radio broadcast assistant Strain:    Difficulty of Paying  Living Expenses:   Food Insecurity:    Worried About Charity fundraiser in the Last Year:    Arboriculturist in the Last Year:   Transportation Needs:    Film/video editor (Medical):    Lack of Transportation (Non-Medical):   Physical Activity:    Days of Exercise per Week:    Minutes of Exercise per Session:   Stress:    Feeling of Stress :   Social Connections:    Frequency of Communication with Friends and Family:    Frequency of Social Gatherings with Friends and Family:    Attends Religious Services:    Active Member of Clubs or Organizations:    Attends Music therapist:    Marital Status:   Intimate Partner Violence:    Fear of Current or Ex-Partner:    Emotionally Abused:    Physically Abused:    Sexually Abused:    Family History:  Family History  Problem Relation Age of Onset   Breast cancer Cousin        diagnosed >50; mother's first cousins   Diabetes Brother    Heart disease Mother    Hypertension Mother    Heart disease Father    Hypertension Father    Prostate cancer Brother 71   Heart attack Maternal Grandmother    Stroke Maternal Grandfather    Colon cancer Neg Hx    Esophageal cancer Neg Hx    Stomach cancer Neg Hx    Rectal cancer Neg Hx     Review of Systems: Constitutional: Doesn't report fevers, chills or abnormal weight loss Eyes: Doesn't report blurriness of vision Ears, nose, mouth, throat, and face: Doesn't report sore throat Respiratory: Doesn't report cough, dyspnea or wheezes Cardiovascular: Doesn't report palpitation, chest discomfort  Gastrointestinal:  Doesn't report nausea, constipation, diarrhea GU: Doesn't report incontinence Skin: Doesn't report skin rashes Neurological: Per HPI Musculoskeletal: Doesn't report joint pain Behavioral/Psych: Doesn't report anxiety  Physical Exam: Vitals:   08/28/19 1047  BP: (!) 136/98  Pulse: 87  Temp: (!) 97 F (36.1 C)  SpO2: 100%    KPS: 80.  General: Alert, cooperative, pleasant, in no acute distress Head: Normal EENT: No conjunctival injection or scleral icterus.  Lungs: Resp effort normal Cardiac: Regular rate Abdomen: Non-distended abdomen Skin: No rashes cyanosis or petechiae. Extremities: No clubbing or edema  Neurologic Exam: Mental Status: Awake, alert, attentive to examiner. Oriented to self and environment. Language is fluent with intact comprehension.  Cranial Nerves: Visual acuity is grossly normal. Visual fields are full. Extra-ocular movements intact. No ptosis. Face is symmetric Motor: Tone and bulk are normal. Power is full in both arms and legs. Reflexes are trace at knees, ankles, no pathologic reflexes present.  Sensory: Impaired high stocking pattern Gait: sensory dystaxia  Labs: I have reviewed the data as listed    Component Value Date/Time   NA 144 08/23/2019 0814   NA 144 10/07/2017 1251   NA 144 11/19/2015 0921   K 3.8 08/23/2019 0814   K 4.8 11/19/2015 0921   CL 106 08/23/2019 0814   CO2 23 08/23/2019 0814   CO2 27 11/19/2015 0921   GLUCOSE 208 (H) 08/23/2019 0814   GLUCOSE 109 11/19/2015 0921   BUN 21 08/23/2019 0814   BUN 18 10/07/2017 1251   BUN 14.4 11/19/2015 0921   CREATININE 1.56 (H) 08/23/2019 0814   CREATININE 1.2 (H) 11/19/2015 0921   CALCIUM 9.0 08/23/2019 0814   CALCIUM 9.8 11/19/2015 0921   PROT 6.8 08/23/2019 0814   PROT 6.6 10/07/2017 1251   PROT 7.2 11/19/2015 0921   ALBUMIN 3.3 (L) 08/23/2019 0814   ALBUMIN 4.2 10/07/2017 1251   ALBUMIN 3.6 11/19/2015 0921   AST 24 08/23/2019 0814   AST 20 11/19/2015 0921   ALT 8 08/23/2019 0814   ALT 14 11/19/2015 0921   ALKPHOS 45 08/23/2019 0814   ALKPHOS 59 11/19/2015 0921   BILITOT 0.4 08/23/2019 0814   BILITOT 0.41 11/19/2015 0921   GFRNONAA 31 (L) 08/23/2019 0814   GFRAA 36 (L) 08/23/2019 0814   Lab Results  Component Value Date   WBC 6.7 08/23/2019   NEUTROABS 4.8 08/23/2019   HGB 8.6 (L) 08/23/2019    HCT 27.1 (L) 08/23/2019   MCV 98.2 08/23/2019   PLT 176 08/23/2019     Assessment/Plan Chemotherapy associated peripheral neuropathy  Donna May presents with a clinical syndrome of symmetric, length dependent large and small fiber polyneuropathy.  Etiology is platinum based chemotherapy exposure combined with diabetes.   Counseled extensively on pathophysiology of neuropathy and clinical management.  She understands that medications cannot reverse toxicity, only manage some symptoms, mainly pain and paresthesias.   She has been refractory to gabapentin and cymbalta.  Recommended trial of Lyrica 12m BID as second/third line therapy.   We spent twenty additional minutes teaching regarding the natural history, biology, and historical experience in the treatment of neurologic complications of cancer.   We appreciate the opportunity to participate in the care of Donna May  We will call her in 1 month to check in on response to Lyrica and continue titration.  All questions were answered. The patient knows to call the clinic with any problems, questions or concerns. No barriers to learning were detected.  The total time spent in the encounter was 40 minutes and more than 50% was on counseling and review of test results   ZVentura Sellers MD Medical Director of Neuro-Oncology [facility] LLCat [place]06/14/21 10:50 AM

## 2019-08-29 ENCOUNTER — Telehealth: Payer: Self-pay | Admitting: Internal Medicine

## 2019-08-29 ENCOUNTER — Encounter: Payer: Medicare Other | Admitting: Adult Health

## 2019-08-29 NOTE — Telephone Encounter (Signed)
Scheduled appt per 6/14 los. Spoke with pt and she is aware of her appt date and time,

## 2019-08-31 ENCOUNTER — Ambulatory Visit: Payer: Medicare Other | Admitting: Physical Therapy

## 2019-08-31 ENCOUNTER — Other Ambulatory Visit: Payer: Self-pay

## 2019-08-31 DIAGNOSIS — R209 Unspecified disturbances of skin sensation: Secondary | ICD-10-CM

## 2019-08-31 DIAGNOSIS — R262 Difficulty in walking, not elsewhere classified: Secondary | ICD-10-CM

## 2019-08-31 DIAGNOSIS — M6281 Muscle weakness (generalized): Secondary | ICD-10-CM

## 2019-08-31 NOTE — Therapy (Addendum)
[place], [place], [address] Phone: [phone]   Fax:  [phone]  Physical Therapy Treatment  Patient Details  Name: MAZELLE HUEBERT MRN: [id number] Date of Birth: [birth year] Referring Provider (PT): Dr. Burr Medico    Encounter Date: 08/31/2019   PT End of Session - 08/31/19 1350    Visit Number 7    Number of Visits 17    Date for PT Re-Evaluation 09/18/19    PT Start Time 1300    PT Stop Time 1345    PT Time Calculation (min) 45 min    Activity Tolerance Patient tolerated treatment well    Behavior During Therapy [facility] for tasks assessed/performed           Past Medical History:  Diagnosis Date  . Anemia   . Anxiety   . Arthritis   . Breast cancer ([place]) 1992  . Cataract    Bilateral eyes - surgery to remove  . Chronic kidney disease    CKD stage 3  . Complication of anesthesia    "something they use make me itch for a couple of days."  . Depression   . Diabetes mellitus    type 2  . Duodenitis 01/18/2002  . Fainting spell   . Family history of breast cancer   . Family history of prostate cancer   . GERD (gastroesophageal reflux disease)   . Heart murmur    never has caused any problems  . Hiatal hernia 08/08/2008, 01/18/2002  . History of pneumonia    x 2  . Hyperlipidemia   . Hypertension   . Liver cancer ([place]) 08/2018  . Lymphedema 2017   Right arm  . Pneumonia    x 2  . UTI (lower urinary tract infection)     Past Surgical History:  Procedure Laterality Date  . AXILLARY SURGERY     cyst removal, right  . COLONOSCOPY  09/23/2018   Dr. Havery Moros - polyps  . EYE SURGERY Bilateral    cataracts to remove  . LAPAROSCOPY N/A 11/17/2018   Procedure: LAPAROSCOPY DIAGNOSTIC, INTRAOPERATIVE ULTRASOUND, PERITONEAL BIOPSIES;  Surgeon: [name], MD;  Location: [facility];  Service: General;  Laterality: N/A;  GENERAL AND EPIDURAL  . LIVER BIOPSY  08/2018   Dr. Lindwood Coke  . MASTECTOMY   1992   right, with flap  . NM MYOCAR PERF WALL MOTION  06/11/2009   Protocol:[place], post stress EF58%, EKG negative for ischemia, low risk  . PORTACATH PLACEMENT N/A 12/02/2018   Procedure: INSERTION PORT-A-CATH;  Surgeon: [name], MD;  Location: [facility];  Service: General;  Laterality: N/A;  . RECONSTRUCTION BREAST W/ TRAM FLAP Right   . TONSILLECTOMY    . TRANSTHORACIC ECHOCARDIOGRAM  12/24/2009   LVEF =>55%, normal study  . UPPER GI ENDOSCOPY      There were no vitals filed for this visit.   Subjective Assessment - 08/31/19 1303    Subjective "I don't think that lyrica is going to work for me" She said that the numbness is feeling worse. She says she is staggering more and feels like te numbness is up into her forearms and legs. She says she has only taken 2 pills.  It makes her very sleepy  Pt advised to call Dr. Garey Ham also sent to him)    Pertinent History Pt with Intrahepaticholangiocarcinoma with metastasis.  She was treated with chemotherapy and developed neuropathy in hands and feet. She is on an ongoing maintenance chemo every  other week.  Past history includes breast cancer in 1992 with ALND and had problems with lymphedema in 2017.  She says she has a sleeve but she doesn't wear it  that much History includes chronic kidney disease and diabetes type II    Patient Stated Goals "to be able to be doing sonething for myself"    Currently in Pain? Yes    Pain Score 10-Worst pain ever    Pain Location --   both arms and legs   Pain Orientation Right;Left    Pain Descriptors / Indicators Numbness    Pain Type Neuropathic pain    Pain Onset More than a month ago    Pain Frequency Constant              [facility] PT Assessment - 08/31/19 0001      Sit to Stand   Comments 5 reps in 30 sec    pt with difficulty with standing balance today                         [facility] Adult PT Treatment/Exercise - 08/31/19 0001      Exercises   Exercises  Shoulder;Lumbar;Knee/Hip      Lumbar Exercises: Standing   Functional Squats 5 reps    Functional Squats Limitations mini squats in front of mat    Other Standing Lumbar Exercises mini dead lift for cues to hip hinge only with 4 pounds on dowel       Lumbar Exercises: Supine   Bridge 10 reps      Knee/Hip Exercises: Standing   Other Standing Knee Exercises staggered stand weight shift wtih cues to eyes for compensation and try to feel the muscles in her legs working as she weight shifts.  Needed min assit to maintain balance 10 reps on each side       Knee/Hip Exercises: Seated   Marching Strengthening;Both;10 reps      Knee/Hip Exercises: Supine   Short Arc Quad Sets Strengthening;Both;2 sets;10 reps   4 pounds on each leg      Knee/Hip Exercises: Sidelying   Hip ABduction Strengthening;Both;10 reps    Hip ABduction Limitations tactile cues at hip to keep hips stacked     Clams 5 reps on each side       Shoulder Exercises: Supine   Protraction Strengthening;Both;10 reps    Protraction Limitations 4 # on dowel rod     Flexion Strengthening;Both;20 reps   10 with no weight on dowel, 10 with 4 # on dowel      Shoulder Exercises: Sidelying   External Rotation Strengthening;Right;Left    External Rotation Limitations difficulty and joint crepitus on right side.  Also 5 reps of isometric on each iside     ABduction AROM;Both;10 reps                    PT Short Term Goals - 08/31/19 1353      PT SHORT TERM GOAL #1   Title Pt will increase reptitions of sit to stand in 30 seconds to 8 indicating an improvment in functional strength    Baseline 5 reps in 30 sec on 07/18/219, 5 reps on 08/31/2019    Time 4    Period Weeks    Status On-going      PT SHORT TERM GOAL #2   Title Pt will report she has noticed mild improvement in her ability to carry out her tasks at  home    Time 4    Period Weeks    Status On-going      PT SHORT TERM GOAL #3   Title Pt will decrease TUG  score to < 10 seconds indicting an improvemet in functional mobility    Baseline 11.49 sec    Period Weeks    Status On-going             PT Long Term Goals - 08/31/19 1354      PT LONG TERM GOAL #1   Title Pt will increase reptitions of sit to stand in 30 seconds to 10 indicating an improvment in functional strength    Baseline 5 on 07/18/2019    Time 8    Period Weeks    Status On-going      PT LONG TERM GOAL #2   Title Pt will report she has noticed moderate mprovement in her ability to carry out her tasks at home    Time 8    Period Weeks    Status On-going      PT LONG TERM GOAL #3   Title Pt will decrease TUG score to < 10 seconds indicting an improvemet in functional mobility    Baseline 11.49 on 07/18/2019    Time 8    Period Weeks    Status On-going                 Plan - 08/31/19 1350    Clinical Impression Statement Pt continues to struggle with  CIPN and weakness and feels that her symptoms have worsened since she started the lyrica. Pt advised to contact her Dr. before she alters medicaion as prescribed. She did well with therapy and seemed to be more steady and move better at end of session.  She needs to continue stengthening execise    Comorbidities previous breast cancer with lymphedema in her right arm, ongoing chemo. DM    Examination-Activity Limitations Reach Overhead;Locomotion Level;Stand;Stairs;Squat;Dressing;Bend    Examination-Participation Restrictions Community Activity;Meal Prep;Cleaning;Shop;Laundry    Clinical Impairments Affecting Rehab Potential history of right axillary lymph node dissection (in 1992), chemotherapy    PT Duration 8 weeks    PT Treatment/Interventions ADLs/Self Care Home Management;Therapeutic exercise;DME Instruction;Patient/family education;Manual techniques;Manual lymph drainage;Compression bandaging;Passive range of motion;Taping;Aquatic Therapy;Gait training;Stair training;Balance training;Therapeutic  activities;Functional mobility training    PT Next Visit Plan consider adding bike or nustep next session . Cont mat exercise for LE and core strength. progress balance activities as able; add balance HEP prn    PT Home Exercise Plan Access Code: [email or web address]    Consulted and Agree with Plan of Care Patient           Patient will benefit from skilled therapeutic intervention in order to improve the following deficits and impairments:  Abnormal gait, Decreased balance, Decreased endurance, Difficulty walking, Impaired sensation, Impaired perceived functional ability, Increased edema, Decreased knowledge of precautions, Decreased range of motion, Decreased activity tolerance, Decreased strength, Postural dysfunction, Pain  Visit Diagnosis: Difficulty in walking, not elsewhere classified  Muscle weakness (generalized)  Unspecified disturbances of skin sensation     Problem List Patient Active Problem List   Diagnosis Date Noted  . Chemotherapy-induced neuropathy ([place]) 08/28/2019  . Port-A-Cath in place 12/14/2018  . Goals of care, counseling/discussion 11/24/2018  . Status post laparoscopy 11/17/2018  . Genetic testing 11/07/2018  . Family history of prostate cancer   . Family history of breast cancer   . Intrahepatic cholangiocarcinoma ([place]) 09/20/2018  . Coronary artery  calcification 04/20/2016  . Ductal carcinoma in situ (DCIS) of right breast 11/26/2015  . Lymphedema of arm 11/26/2015  . Lymphedema 11/01/2015  . Leg pain, bilateral 12/28/2013  . Special screening for malignant neoplasms, colon 08/09/2013  . Renal artery stenosis, native, bilateral ([place]) 06/09/2013  . Diabetes mellitus with renal complications ([place]) 17/47/1595  . RASH AND OTHER NONSPECIFIC SKIN ERUPTION 07/05/2008  . Malignant neoplasm of female breast ([place]) 07/04/2008  . HYPERCHOLESTEROLEMIA 07/04/2008  . Essential hypertension 07/04/2008  . GERD 07/04/2008  . DUODENITIS, WITHOUT HEMORRHAGE 07/04/2008  .  HIATAL HERNIA 07/04/2008  . Arthropathy 07/04/2008   Donato Heinz. Owens Shark PT  Norwood Levo 08/31/2019, 1:58 PM  [place], [place], [address] Phone: [phone]   Fax:  [phone]  Name: SIYAH MAULT MRN: [id number] Date of Birth: 1941/03/01

## 2019-09-06 ENCOUNTER — Inpatient Hospital Stay: Payer: Medicare Other | Admitting: Nutrition

## 2019-09-06 ENCOUNTER — Other Ambulatory Visit: Payer: Self-pay

## 2019-09-06 ENCOUNTER — Inpatient Hospital Stay: Payer: Medicare Other

## 2019-09-06 ENCOUNTER — Inpatient Hospital Stay (HOSPITAL_BASED_OUTPATIENT_CLINIC_OR_DEPARTMENT_OTHER): Payer: Medicare Other | Admitting: Medical

## 2019-09-06 ENCOUNTER — Encounter: Payer: Self-pay | Admitting: Adult Health

## 2019-09-06 VITALS — BP 135/80 | HR 78 | Temp 97.7°F | Resp 18 | Ht 65.0 in | Wt 134.3 lb

## 2019-09-06 DIAGNOSIS — Z5111 Encounter for antineoplastic chemotherapy: Secondary | ICD-10-CM | POA: Diagnosis not present

## 2019-09-06 DIAGNOSIS — C221 Intrahepatic bile duct carcinoma: Secondary | ICD-10-CM

## 2019-09-06 DIAGNOSIS — Z95828 Presence of other vascular implants and grafts: Secondary | ICD-10-CM

## 2019-09-06 LAB — CMP (CANCER CENTER ONLY)
ALT: 9 U/L (ref 0–44)
AST: 22 U/L (ref 15–41)
Albumin: 3 g/dL — ABNORMAL LOW (ref 3.5–5.0)
Alkaline Phosphatase: 53 U/L (ref 38–126)
Anion gap: 9 (ref 5–15)
BUN: 16 mg/dL (ref 8–23)
CO2: 23 mmol/L (ref 22–32)
Calcium: 8.4 mg/dL — ABNORMAL LOW (ref 8.9–10.3)
Chloride: 110 mmol/L (ref 98–111)
Creatinine: 1.24 mg/dL — ABNORMAL HIGH (ref 0.44–1.00)
GFR, Est AFR Am: 48 mL/min — ABNORMAL LOW (ref >60)
GFR, Estimated: 41 mL/min — ABNORMAL LOW (ref >60)
Glucose, Bld: 168 mg/dL — ABNORMAL HIGH (ref 70–99)
Potassium: 3.6 mmol/L (ref 3.5–5.1)
Sodium: 142 mmol/L (ref 135–145)
Total Bilirubin: 0.6 mg/dL (ref 0.3–1.2)
Total Protein: 6.6 g/dL (ref 6.5–8.1)

## 2019-09-06 LAB — CBC WITH DIFFERENTIAL (CANCER CENTER ONLY)
Abs Immature Granulocytes: 0.02 10*3/uL (ref 0.00–0.07)
Basophils Absolute: 0 10*3/uL (ref 0.0–0.1)
Basophils Relative: 0 %
Eosinophils Absolute: 0.1 10*3/uL (ref 0.0–0.5)
Eosinophils Relative: 2 %
HCT: 25.7 % — ABNORMAL LOW (ref 36.0–46.0)
Hemoglobin: 8 g/dL — ABNORMAL LOW (ref 12.0–15.0)
Immature Granulocytes: 0 %
Lymphocytes Relative: 12 %
Lymphs Abs: 0.7 10*3/uL (ref 0.7–4.0)
MCH: 31.4 pg (ref 26.0–34.0)
MCHC: 31.1 g/dL (ref 30.0–36.0)
MCV: 100.8 fL — ABNORMAL HIGH (ref 80.0–100.0)
Monocytes Absolute: 1 10*3/uL (ref 0.1–1.0)
Monocytes Relative: 16 %
Neutro Abs: 4.3 10*3/uL (ref 1.7–7.7)
Neutrophils Relative %: 70 %
Platelet Count: 177 10*3/uL (ref 150–400)
RBC: 2.55 MIL/uL — ABNORMAL LOW (ref 3.87–5.11)
RDW: 20.1 % — ABNORMAL HIGH (ref 11.5–15.5)
WBC Count: 6.1 10*3/uL (ref 4.0–10.5)
nRBC: 0 % (ref 0.0–0.2)

## 2019-09-06 LAB — MAGNESIUM: Magnesium: 2 mg/dL (ref 1.7–2.4)

## 2019-09-06 MED ORDER — PROCHLORPERAZINE MALEATE 10 MG PO TABS
10.0000 mg | ORAL_TABLET | Freq: Once | ORAL | Status: AC
  Administered 2019-09-06: 10 mg via ORAL

## 2019-09-06 MED ORDER — SODIUM CHLORIDE 0.9 % IV SOLN
Freq: Once | INTRAVENOUS | Status: AC
  Filled 2019-09-06: qty 250

## 2019-09-06 MED ORDER — SODIUM CHLORIDE 0.9% FLUSH
10.0000 mL | INTRAVENOUS | Status: DC | PRN
  Administered 2019-09-06: 10 mL
  Filled 2019-09-06: qty 10

## 2019-09-06 MED ORDER — SODIUM CHLORIDE 0.9 % IV SOLN
1330.0000 mg | Freq: Once | INTRAVENOUS | Status: AC
  Administered 2019-09-06: 1330 mg via INTRAVENOUS
  Filled 2019-09-06: qty 34.98

## 2019-09-06 MED ORDER — SODIUM CHLORIDE 0.9% FLUSH
10.0000 mL | Freq: Once | INTRAVENOUS | Status: AC
  Administered 2019-09-06: 10 mL
  Filled 2019-09-06: qty 10

## 2019-09-06 MED ORDER — HEPARIN SOD (PORK) LOCK FLUSH 100 UNIT/ML IV SOLN
500.0000 [IU] | Freq: Once | INTRAVENOUS | Status: AC | PRN
  Administered 2019-09-06: 500 [IU]
  Filled 2019-09-06: qty 5

## 2019-09-06 MED ORDER — PROCHLORPERAZINE MALEATE 10 MG PO TABS
ORAL_TABLET | ORAL | Status: AC
  Filled 2019-09-06: qty 1

## 2019-09-06 NOTE — Progress Notes (Signed)
Nutrition follow-up completed with patient during infusion for metastatic cholangiocarcinoma. Patient reports she is eating smaller amounts more often. She only eats what she wants to eat. She tries to drink 1 boost daily and is trying to very the flavor. She has constipation however has not taken medication.  She reports she will drink some prune juice later today. Weight improved to 134.3 pounds increased from 129.9 pounds on June 9. She was 139.9 pounds on February 3.  Nutrition diagnosis: Inadequate oral intake is stable.  Intervention: Encourage patient to continue strategies for smaller more frequent meals and snacks focusing on high-protein foods. Continue boost daily.  Provided suggestions for improving flavors by adding fruit. Encouraged bowel regimen.  Monitoring, evaluation, goals: Patient will tolerate adequate calories and protein for weight maintenance.  Next visit: Wednesday, July 21 during infusion.  **Disclaimer: This note was dictated with voice recognition software. Similar sounding words can inadvertently be transcribed and this note may contain transcription errors which may not have been corrected upon publication of note.**

## 2019-09-06 NOTE — Progress Notes (Signed)
OK t6o proceed with chemotherapy.  Sandi Mealy, MHS, PA-C

## 2019-09-06 NOTE — Addendum Note (Signed)
Addended by: Tora Kindred on: 09/06/2019 09:51 AM   Modules accepted: Orders

## 2019-09-06 NOTE — Progress Notes (Signed)
Met with patient in infusion to introduce myself as Financial Resource Specialist and to offer available resources.  Discussed one-time $1000 [employer] grant and qualifications to assist with personal expenses while going through treatment.  Gave her my card if interested in applying and for any additional financial questions or concerns. 

## 2019-09-06 NOTE — Patient Instructions (Signed)
[facility] Discharge Instructions for Patients Receiving Chemotherapy  Today you received the following chemotherapy agents: Gemzar  To help prevent nausea and vomiting after your treatment, we encourage you to take your nausea medication as prescribed.    If you develop nausea and vomiting that is not controlled by your nausea medication, call the clinic.   BELOW ARE SYMPTOMS THAT SHOULD BE REPORTED IMMEDIATELY:  *FEVER GREATER THAN 100.5 F  *CHILLS WITH OR WITHOUT FEVER  NAUSEA AND VOMITING THAT IS NOT CONTROLLED WITH YOUR NAUSEA MEDICATION  *UNUSUAL SHORTNESS OF BREATH  *UNUSUAL BRUISING OR BLEEDING  TENDERNESS IN MOUTH AND THROAT WITH OR WITHOUT PRESENCE OF ULCERS  *URINARY PROBLEMS  *BOWEL PROBLEMS  UNUSUAL RASH Items with * indicate a potential emergency and should be followed up as soon as possible.  Feel free to call the clinic should you have any questions or concerns. The clinic phone number is [phone].  Please show the [facility] at check-in to the Emergency Department and triage nurse.     Blood Transfusion, Adult, Care After This sheet gives you information about how to care for yourself after your procedure. Your doctor may also give you more specific instructions. If you have problems or questions, contact your doctor. What can I expect after the procedure? After the procedure, it is common to have:  Bruising and soreness at the IV site.  A fever or chills on the day of the procedure. This may be your body's response to the new blood cells received.  A headache. Follow these instructions at home: Insertion site care      Follow instructions from your doctor about how to take care of your insertion site. This is where an IV tube was put into your vein. Make sure you: ? Wash your hands with soap and water before and after you change your bandage (dressing). If you cannot use soap and water, use hand sanitizer. ? Change  your bandage as told by your doctor.  Check your insertion site every day for signs of infection. Check for: ? Redness, swelling, or pain. ? Bleeding from the site. ? Warmth. ? Pus or a bad smell. General instructions  Take over-the-counter and prescription medicines only as told by your doctor.  Rest as told by your doctor.  Go back to your normal activities as told by your doctor.  Keep all follow-up visits as told by your doctor. This is important. Contact a doctor if:  You have itching or red, swollen areas of skin (hives).  You feel worried or nervous (anxious).  You feel weak after doing your normal activities.  You have redness, swelling, warmth, or pain around the insertion site.  You have blood coming from the insertion site, and the blood does not stop with pressure.  You have pus or a bad smell coming from the insertion site. Get help right away if:  You have signs of a serious reaction. This may be coming from an allergy or the body's defense system (immune system). Signs include: ? Trouble breathing or shortness of breath. ? Swelling of the face or feeling warm (flushed). ? Fever or chills. ? Head, chest, or back pain. ? Dark pee (urine) or blood in the pee. ? Widespread rash. ? Fast heartbeat. ? Feeling dizzy or light-headed. You may receive your blood transfusion in an outpatient setting. If so, you will be told whom to contact to report any reactions. These symptoms may be an emergency. Do not  wait to see if the symptoms will go away. Get medical help right away. Call your local emergency services (911 in the [place] Do not drive yourself to the hospital. Summary  Bruising and soreness at the IV site are common.  Check your insertion site every day for signs of infection.  Rest as told by your doctor. Go back to your normal activities as told by your doctor.  Get help right away if you have signs of a serious reaction. This information is not intended  to replace advice given to you by your health care provider. Make sure you discuss any questions you have with your health care provider. Document Revised: 08/25/2018 Document Reviewed: 08/25/2018 Elsevier Patient Education  [place].

## 2019-09-06 NOTE — Progress Notes (Signed)
[place]   Telephone:([phone]) [phone] Fax:(336) [phone]   Clinic Follow up Note   Patient Care Team: Donna Ruddy, MD as PCP - General (Family Medicine) [name], Donna Gobble, MD as Consulting Physician (Cardiology) Donna Bruins, MD as Consulting Physician (Obstetrics and Gynecology) Donna May, Donna Massed, NP as Nurse Practitioner (Hematology and Oncology) [facility] Donna Merle, MD as Consulting Physician (Hematology) [name], [name], MD as Consulting Physician (Gastroenterology) Donna Snipe, RN (Inactive) as Oncology Nurse Navigator [name], MD as Consulting Physician (General Surgery)  Date of Service:  09/06/2019  CHIEF COMPLAINT: F/u ofcholangiocarcinomaof liver.  [name] presents for cycle 21, day 1 of gemcitabine today.  SUMMARY OF ONCOLOGIC HISTORY: Oncology History  Malignant neoplasm of female breast ([place])  11/06/2018 Genetic Testing   Negative genetic testing on the [facility] Common Hereditary Cancers panel. A variant of uncertain significance was identified in one of her APC genes, called c.1243G>A (p.Ala415Thr).  The Common Hereditary Cancers Panel offered by [facility] includes sequencing and/or deletion duplication testing of the following 48 genes: APC, ATM, AXIN2, BARD1, BMPR1A, BRCA1, BRCA2, BRIP1, CDH1, CDK4, CDKN2A (p14ARF), CDKN2A (p16INK4a), CHEK2, CTNNA1, DICER1, EPCAM (Deletion/duplication testing only), GREM1 (promoter region deletion/duplication testing only), KIT, MEN1, MLH1, MSH2, MSH3, MSH6, MUTYH, NBN, NF1, NHTL1, PALB2, PDGFRA, PMS2, POLD1, POLE, PTEN, RAD50, RAD51C, RAD51D, RNF43, SDHB, SDHC, SDHD, SMAD4, SMARCA4. STK11, TP53, TSC1, TSC2, and VHL.  The following genes were evaluated for sequence changes only: SDHA and HOXB13 c.251G>A variant only.    Intrahepatic cholangiocarcinoma ([place])  09/07/2018 Imaging   CT Chest IMPRESSION: 1. New, enhancing mass involving segment 4 of the liver and fundus of  gallbladder is concerning for malignancy. This may represent either metastatic disease from breast cancer or neoplasm primary to the liver or hepatic biliary tree. Further evaluation with contrast enhanced CT of the abdomen and pelvis is recommended. 2. No findings to suggest metastatic disease within the chest. 3.  Aortic Atherosclerosis (ICD10-I70.0). 4. Coronary artery calcifications.   09/13/2018 Pathology Results   Diagnosis Liver, needle/core biopsy - ADENOCARCINOMA. Microscopic Comment Immunohistochemistry for CK7 is positive. CK20, TTF1, CDX-2, GATA-3, PAX 8, Qualitative ER, p63 and CK5/6 are negative. The provided clinical history of remote mammary carcinoma is noted. Based on the morphology and immunophenotype of the adenocarcinoma observed in this specimen, primary cholangiocarcinoma is favored. Clinical and radiologic correlation are  encouraged. Results reported to [employer] on 09/15/2018. Intradepartmental consultation (Dr. Vic Ripper).   09/13/2018 Initial Diagnosis   Cholangiocarcinoma ([place])   09/23/2018 Procedure   Colonoscopy by Dr. Havery Moros 09/23/18  IMPRESSION - Two 3 to 4 mm polyps in the ascending colon, removed with a cold snare. Resected and retrieved. - Five 3 to 5 mm polyps in the transverse colon, removed with a cold snare. Resected and retrieved. - One 5 mm polyp at the splenic flexure, removed with a cold snare. Resected and retrieved. - Three 3 to 5 mm polyps in the sigmoid colon, removed with a cold snare. Resected and retrieved. - The examination was otherwise normal. Upper Endopscy by Dr. Havery Moros 09/23/18  IMPRESSION - Esophagogastric landmarks identified. - 2 cm hiatal hernia. - Normal esophagus otherwise. - A single gastric polyp. Resected and retrieved. - Mild gastritis. Biopsied. - Normal duodenal bulb and second portion of the duodenum.   09/23/2018 Pathology Results   Diagnosis 09/23/18 1. Surgical [P], duodenum - BENIGN SMALL BOWEL  MUCOSA. - NO ACTIVE INFLAMMATION OR VILLOUS ATROPHY IDENTIFIED. 2. Surgical [P], stomach, polyp - HYPERPLASTIC POLYP(S). - THERE  IS NO EVIDENCE OF MALIGNANCY. 3. Surgical [P], gastric antrum and gastric body - CHRONIC INACTIVE GASTRITIS. - THERE IS NO EVIDENCE OF HELICOBACTER-PYLORI, DYSPLASIA, OR MALIGNANCY. - SEE COMMENT. 4. Surgical [P], colon, sigmoid, splenic flexure, transverse and ascending, polyp (9) - TUBULAR ADENOMA(S). - SESSILE SERRATED POLYP WITHOUT CYTOLOGIC DYSPLASIA. - HIGH GRADE DYSPLASIA IS NOT IDENTIFIED. 5. Surgical [P], colon, sigmoid, polyp (2) - HYPERPLASTIC POLYP(S). - THERE IS NO EVIDENCE OF MALIGNANCY.   09/23/2018 Cancer Staging   Staging form: Intrahepatic Bile Duct, AJCC 8th Edition - Clinical stage from 09/23/2018: Stage IB (cT1b, cN0, cM0) - Signed by Donna Merle, MD on 10/06/2018   09/29/2018 PET scan   PET 09/29/18 IMPRESSION: 1. Hypermetabolic mass in the RIGHT hepatic lobe consistent with biopsy proven adenocarcinoma. No additional liver metastasis. 2. No evidence of local breast cancer recurrence in the RIGHT breast or RIGHT axilla. 3. Mild bilateral hypermetabolic adrenal glands is favored benign hyperplasia. 4. No evidence of additional metastatic disease on skull base to thigh FDG PET scan.   11/06/2018 Genetic Testing   Negative genetic testing on the [employer] Common Hereditary Cancers panel. A variant of uncertain significance was identified in one of her APC genes, called c.1243G>A (p.Ala415Thr).  The Common Hereditary Cancers Panel offered by [facility] includes sequencing and/or deletion duplication testing of the following 48 genes: APC, ATM, AXIN2, BARD1, BMPR1A, BRCA1, BRCA2, BRIP1, CDH1, CDK4, CDKN2A (p14ARF), CDKN2A (p16INK4a), CHEK2, CTNNA1, DICER1, EPCAM (Deletion/duplication testing only), GREM1 (promoter region deletion/duplication testing only), KIT, MEN1, MLH1, MSH2, MSH3, MSH6, MUTYH, NBN, NF1, NHTL1, PALB2, PDGFRA, PMS2, POLD1, POLE,  PTEN, RAD50, RAD51C, RAD51D, RNF43, SDHB, SDHC, SDHD, SMAD4, SMARCA4. STK11, TP53, TSC1, TSC2, and VHL.  The following genes were evaluated for sequence changes only: SDHA and HOXB13 c.251G>A variant only.    11/17/2018 Pathology Results   Diagnosis 11/17/18 1. Soft tissue, biopsy, Diaphragmatic nodules - METASTATIC ADENOCARCINOMA, CONSISTENT WITH PATIENT'S CLINICAL HISTORY OF CHOLANGIOCARCINOMA. SEE NOTE 2. Liver, biopsy, Left - LIVER PARENCHYMA WITH A BENIGN FIBROTIC NODULE - NO EVIDENCE OF MALIGNANCY 3. Stomach, biopsy - BENIGN PAPILLARY MESOTHELIAL HYPERPLASIA - NO EVIDENCE OF MALIGNANCY   11/29/2018 Imaging   CT CAP WO Contrast  IMPRESSION: 1. Dominant liver mass appears grossly stable from 09/29/2018. Additional liver lesions are too small to characterize but were not shown to be hypermetabolic on PET. 2. Mild nodularity of both adrenal glands with associated hypermetabolism on 09/29/2018. Continued attention on follow-up exams is warranted. 3. Small right lower lobe nodules, stable from 09/07/2018. Again, attention on follow-up is recommended. 4. Trace bilateral pleural fluid. 5. Aortic atherosclerosis (ICD10-170.0). Coronary artery calcification. 6. Enlarged pulmonic trunk, indicative of pulmonary arterial hypertension.     11/30/2018 - 06/14/2019 Chemotherapy   First line chemo Oxaliplatin and gemcitabine q2weeks starting 11/30/18. Stopped Oxaliplatin on 05/03/19 due to worsening neuropathy. Added Cisplatin with C13 on 05/17/19 and stopped on C15 06/14/19 due to neuropathy. Reduced to maintenance single agent Gemcitabine on 06/28/19.    01/20/2019 Imaging   CT CAP IMPRESSION: restaging  1. Mild interval increase in size of dominant liver mass involving segment 4 and segment 5. 2. No significant or progressive adrenal nodularity identified to suggest metastatic disease. 3. Unchanged appearance of small right lower lobe and lingular lung nodules, nonspecific.   03/21/2019 PET scan     IMPRESSION: 1. Large right hepatic mass with SUV uptake near background hepatic activity, dramatic response to therapy, also with decrease in size when compared to the prior study. 2. Signs of prior right mastectomy  and axillary dissection as before. 3. Left adrenal activity in no longer above the level of activity seen in the contralateral, right adrenal gland or in the liver. Attention on follow-up. 4. No new signs of disease.   06/27/2019 PET scan   IMPRESSION: 1. Further decrease in size of a dominant hepatic mass which is non FDG avid. 2. No evidence of metastatic disease. 3. No evidence of left adrenal hypermetabolism or mass. 4. Incidental findings, including: Uterine fibroids. Coronary artery atherosclerosis. Aortic Atherosclerosis (ICD10-I70.0). Pulmonary artery enlargement suggests pulmonary arterial hypertension.   06/28/2019 -  Chemotherapy   Maintenance single agent Gemcitabine q2weeks  starting on 06/28/19 with C16.       CURRENT THERAPY:  Maintenance single agent Gemcitabine q2weeks starting on 06/28/19 with C16.  INTERVAL HISTORY:  Donna May is a 79 y.o. female with a diagnosis of a cholangiocarcinoma.  The patient presents to the clinic today for cycle 21, day 1 of gemcitabine.  She continues to be troubled with neuropathy of her hands and feet.  She reports that 1 year ago she was very active and was working in her yard and in her garden.  She is not able to do these things now due to her neuropathy.  She would like to see a specialist regarding her neuropathy.  Her appetite is stable.  Her energy level is low but stable.  She denies any other issues of concern today.   REVIEW OF SYSTEMS:   Constitutional: Denies fevers, chills or abnormal weight loss Eyes: Denies blurriness of vision Ears, nose, mouth, throat, and face: Denies mucositis or sore throat Respiratory: Denies cough, dyspnea or wheezes Cardiovascular: Denies palpitation, chest discomfort or  lower extremity swelling Gastrointestinal:  Denies nausea, heartburn or change in bowel habits Skin: Denies abnormal skin rashes Lymphatics: Denies new lymphadenopathy or easy bruising Neurological (+) Stable neuropathy affecting the hands and feet Behavioral/Psych: Mood is stable, no new changes  All other systems were reviewed with the patient and are negative.  MEDICAL HISTORY:  Past Medical History:  Diagnosis Date  . Anemia   . Anxiety   . Arthritis   . Breast cancer ([place]) 1992  . Cataract    Bilateral eyes - surgery to remove  . Chronic kidney disease    CKD stage 3  . Complication of anesthesia    "something they use make me itch for a couple of days."  . Depression   . Diabetes mellitus    type 2  . Duodenitis 01/18/2002  . Fainting spell   . Family history of breast cancer   . Family history of prostate cancer   . GERD (gastroesophageal reflux disease)   . Heart murmur    never has caused any problems  . Hiatal hernia 08/08/2008, 01/18/2002  . History of pneumonia    x 2  . Hyperlipidemia   . Hypertension   . Liver cancer ([place]) 08/2018  . Lymphedema 2017   Right arm  . Pneumonia    x 2  . UTI (lower urinary tract infection)     SURGICAL HISTORY: Past Surgical History:  Procedure Laterality Date  . AXILLARY SURGERY     cyst removal, right  . COLONOSCOPY  09/23/2018   Dr. Havery Moros - polyps  . EYE SURGERY Bilateral    cataracts to remove  . LAPAROSCOPY N/A 11/17/2018   Procedure: LAPAROSCOPY DIAGNOSTIC, INTRAOPERATIVE ULTRASOUND, PERITONEAL BIOPSIES;  Surgeon: [name], MD;  Location: [facility];  Service: General;  Laterality: N/A;  GENERAL  AND EPIDURAL  . LIVER BIOPSY  08/2018   Dr. Lindwood Coke  . MASTECTOMY  1992   right, with flap  . NM MYOCAR PERF WALL MOTION  06/11/2009   Protocol:[place], post stress EF58%, EKG negative for ischemia, low risk  . PORTACATH PLACEMENT N/A 12/02/2018   Procedure: INSERTION PORT-A-CATH;  Surgeon: [name], MD;   Location: [facility];  Service: General;  Laterality: N/A;  . RECONSTRUCTION BREAST W/ TRAM FLAP Right   . TONSILLECTOMY    . TRANSTHORACIC ECHOCARDIOGRAM  12/24/2009   LVEF =>55%, normal study  . UPPER GI ENDOSCOPY      I have reviewed the social history and family history with the patient and they are unchanged from previous note.  ALLERGIES:  has No Known Allergies.  MEDICATIONS:  Current Outpatient Medications  Medication Sig Dispense Refill  . amLODipine (NORVASC) 10 MG tablet TAKE 1 TABLET BY MOUTH EVERY DAY 90 tablet 3  . aspirin 81 MG tablet Take 81 mg by mouth at bedtime.     . Calcium Carbonate-Vitamin D 600-200 MG-UNIT TABS Take 1 tablet by mouth daily.    Marland Kitchen CALCIUM-MAGNESIUM-VITAMIN D PO Take 1 tablet by mouth once a week.     . Cholecalciferol (VITAMIN D3) 2000 UNITS TABS Take 1 tablet by mouth once a week.     . DULoxetine (CYMBALTA) 30 MG capsule Take 1 capsule (30 mg total) by mouth daily. 90 capsule 1  . Echinacea-Goldenseal (ECHINACEA COMB/GOLDEN SEAL) CAPS Take by mouth.    Marland Kitchen glucose blood test strip 1 each by Other route 2 (two) times daily. Use Onetouch verio test strips as instructed to check blood sugar twice daily. 100 each 2  . hydrochlorothiazide (HYDRODIURIL) 25 MG tablet Take 1 tablet (25 mg total) by mouth daily. 90 tablet 3  . HYDROcodone-acetaminophen (NORCO/VICODIN) 5-325 MG tablet Take 1 tablet by mouth every 6 (six) hours as needed for moderate pain. 10 tablet 0  . ketoconazole (NIZORAL) 2 % cream Apply 1 fingertip amount to each foot daily. 30 g 0  . lansoprazole (PREVACID) 15 MG capsule Take 1 capsule (15 mg total) by mouth daily. 90 capsule 1  . lidocaine-prilocaine (EMLA) cream Apply to affected area once 90 g 1  . loratadine (CLARITIN) 10 MG tablet Take 10 mg by mouth daily as needed for allergies.    . metFORMIN (GLUCOPHAGE-XR) 500 MG 24 hr tablet Take 1 tablet (500 mg total) by mouth 2 (two) times daily before a meal. 180 tablet 1  . metoprolol  succinate (TOPROL-XL) 100 MG 24 hr tablet Take 1 tablet (100 mg total) by mouth daily. Take with or immediately following a meal. 90 tablet 1  . Omega 3 1000 MG CAPS Take 2,000 mg by mouth daily.     . ondansetron (ZOFRAN) 8 MG tablet Take 1 tablet (8 mg total) by mouth every 8 (eight) hours as needed for nausea or vomiting. 30 tablet 0  . pregabalin (LYRICA) 75 MG capsule Take 1 capsule (75 mg total) by mouth 2 (two) times daily. 60 capsule 1  . Prenatal Vit-Fe Fumarate-FA (MULTIVITAMIN-PRENATAL) 27-0.8 MG TABS tablet Take 1 tablet by mouth daily at 12 noon.    . prochlorperazine (COMPAZINE) 10 MG tablet Take 1 tablet (10 mg total) by mouth every 6 (six) hours as needed for nausea or vomiting. 30 tablet 0  . raloxifene (EVISTA) 60 MG tablet TAKE 1 TABLET DAILY 90 tablet 0  . rosuvastatin (CRESTOR) 20 MG tablet Take 1 tablet (20 mg total)  by mouth daily. 90 tablet 1  . sitaGLIPtin (JANUVIA) 100 MG tablet Take 1 tablet (100 mg total) by mouth daily. 90 tablet 4  . triamcinolone cream (KENALOG) 0.1 % Apply 1 application topically 2 (two) times daily. 30 g 0  . valsartan (DIOVAN) 320 MG tablet Take 1 tablet (320 mg total) by mouth daily. 90 tablet 3   Current Facility-Administered Medications  Medication Dose Route Frequency Provider Last Rate Last Admin  . triamcinolone acetonide (KENALOG) 10 MG/ML injection 10 mg  10 mg Other Once Harriet Masson, DPM        PHYSICAL EXAMINATION: ECOG PERFORMANCE STATUS: 2 - Symptomatic, <50% confined to bed  Vitals:   09/06/19 0823  BP: 135/80  Pulse: 78  Resp: 18  Temp: 97.7 F (36.5 C)  SpO2: 95%   Filed Weights   09/06/19 0823  Weight: 134 lb 4.8 oz (60.9 kg)    Physical Exam Constitutional:      General: She is not in acute distress.    Appearance: Normal appearance. She is not ill-appearing.  HENT:     Head: Normocephalic and atraumatic.  Eyes:     General: No scleral icterus.       Right eye: No discharge.        Left eye: No  discharge.  Cardiovascular:     Rate and Rhythm: Normal rate and regular rhythm.     Heart sounds: No murmur heard.  No friction rub. No gallop.   Pulmonary:     Effort: Pulmonary effort is normal. No respiratory distress.     Breath sounds: Normal breath sounds. No wheezing, rhonchi or rales.  Abdominal:     General: Abdomen is flat. Bowel sounds are normal. There is no distension.     Palpations: Abdomen is soft. There is no mass.     Tenderness: There is no abdominal tenderness.  Neurological:     Mental Status: She is alert.     Gait: Gait abnormal (The patient is ambulating with the use of a cane.).  Psychiatric:        Mood and Affect: Mood normal.        Behavior: Behavior normal.        Thought Content: Thought content normal.        Judgment: Judgment normal.     LABORATORY DATA:  I have reviewed the data as listed CBC Latest Ref Rng & Units 09/06/2019 08/23/2019 08/09/2019  WBC 4.0 - 10.5 K/uL 6.1 6.7 5.8  Hemoglobin 12.0 - 15.0 g/dL 8.0(L) 8.6(L) 7.5(L)  Hematocrit 36 - 46 % 25.7(L) 27.1(L) 23.8(L)  Platelets 150 - 400 K/uL 177 176 218     CMP Latest Ref Rng & Units 09/06/2019 08/23/2019 08/09/2019  Glucose 70 - 99 mg/dL 168(H) 208(H) 97  BUN 8 - 23 mg/dL _0 Creatinine 0.44 - 1.00 mg/dL 1.24(H) 1.56(H) 1.26(H)  Sodium 135 - 145 mmol/L 142 144 143  Potassium 3.5 - 5.1 mmol/L 3.6 3.8 3.8  Chloride 98 - 111 mmol/L 110 106 108  CO2 22 - 32 mmol/L _1 Calcium 8.9 - 10.3 mg/dL 8.4(L) 9.0 8.5(L)  Total Protein 6.5 - 8.1 g/dL 6.6 6.8 6.6  Total Bilirubin 0.3 - 1.2 mg/dL 0.6 0.4 0.3  Alkaline Phos 38 - 126 U/L 53 45 48  AST 15 - 41 U/L _2 ALT 0 - 44 U/L _3 RADIOGRAPHIC STUDIES: I have personally reviewed the  radiological images as listed and agreed with the findings in the report. No results found.   ASSESSMENT & PLAN:  ZYANNA LEISINGER is a 79 y.o. female with a cholangiocarcinoma.  She presents to the clinic today for cycle 21, day 1 of  gemcitabine.   1.Intrahepatic cholangiocarcinoma, cT1N0M1,with peritoneal metastasis, MSS, IDH1 mutation (+) -She was diagnosed in 08/2018. Her biopsy of her liver mass shows adenocarcinoma,most consistent withcholangiocarcinoma.Her EGD/Colonoscopy from 7/10/20was negativeand 09/29/18 PET scanshowedno evidence of distant metastasis. -She was brought to OR on 9/3,unfortunately she was found to have peritoneal metastasis to right diaphragm and surgery was aborted. -Given her metastatic cancer, she was started onsystemicchemo withFirst line chemoOxaliplatinand gemcitabine q2weeksto control her disease.She started on 11/30/18.Stopped Oxaliplatin on 05/03/19 due to worsening neuropathy.AddedCisplatin with C13 on 05/17/19.Due to progressing neuropathy cisplatin was stopped on 06/14/19 and she proceeding with single agent Gemcitabineevery 2 weeks starting 06/28/19 with C16.  -She continues to tolerate Gemcitabine well. -Labs reviewed, Hg 8.0.  Her chemistry panel and magnesium were stable/normal still pending.  She will proceed with Gemcitabine today.  -She has a PET scan scheduled for 09/15/2019.  She then will return to see Dr. Burr Medico on 09/20/2019.  2. Anemia -Secondary to chemo -Shepreviouslyrequiredblood transfusionon1/13/21. Will continue prenatal vitamin. -Her Hg returned at 8.0 today (08/09/19).I discussed with her that she may need a transfusion on her return.  3. Neuropathy, G3 -secondary to Oxaliplatinand Cisplatin, both currently d/c. -She still has tingling and numbness of hands and feet. She ambulates with cane and her lack of gripping is effecting her quality of life.  -She is currently not taking gabapentin or Lyrica.  I suggested to her that she might want to restart her gabapentin at bedtime.  She is agreeable to try this.   -She can continue Cymbalta, take B12 or B complex, exercise and stay warm.  -Dr. Truitt May has previously discussed that her neuropathy will  likely not 100% recover but can improve partially slowly over time. -She is open to home care, I will see who is able to do this under her insurance. She will think about it. -She continues with PT/OT. -She will be seeing Dr Mickeal Skinner for neuropathy evaluation and treatment later this week.   4. H/o right breast cancer, Geneticsnegative -s/p mastectomy in 1992, per patient did not require adjuvant therapy  -followed by Dr. Jana Hakim -Her genetic testing wasnegativefor pathogeneticmutations. She did have VUS of gene APC.  5.Comorbidities:DM,HTN,hiatal hernia, GERD, renal artery stenosis -f/u with PCP -We will monitor her blood pressure and glucose level closely during her chemo. We will adjust her meds as needed -She did not tolerate Glucerna, she will continue low sugar protein shakes.  6. Family support  -She has a son and daughterwho are often busy but help as needed.  -She notes they are aware of her condition. I encouraged her to keep her children updated and ask them for more home support.   7.Goal of care discussion  -The patient understands the goal of care is palliative. -she is full code now  8.Hypertension, uncontrolled -She is on amlodipine and HTCZ, metoprolol and losartan. I encouraged her to make sure she takes daily and f/u with PCP for possible dose adjustment given recent elevated BP in clinic.  9. Taste change, weight loss  -Despite changes in taste she continues to eat and has gained 3 pounds since her last visit of 9 days ago.   -She will continue to f/u with Dietician and will be seeing Dory Peru  later this week.  10. Elevated Cr  -She continues to push fluids.  Her creatinine is better today at 1.24.   PLAN: -Labs reviewed and adequate to proceed with Gemcitabine today at 800 mg/m  -Lab, flush, f/uandGemcitabine on 09/20/2019 after review of PET scan with Dr. Burr Medico -Proceed with appointment with Dr Mickeal Skinner later this week for  evaluation of her peripheral neuropathy   No problem-specific Assessment & Plan notes found for this encounter.   No orders of the defined types were placed in this encounter.   The total time spent in the appointment was 30 minutes.     Harle Stanford, PA-C 09/06/2019

## 2019-09-07 ENCOUNTER — Telehealth: Payer: Self-pay | Admitting: Internal Medicine

## 2019-09-08 ENCOUNTER — Inpatient Hospital Stay (HOSPITAL_BASED_OUTPATIENT_CLINIC_OR_DEPARTMENT_OTHER): Payer: Medicare Other | Admitting: Internal Medicine

## 2019-09-08 DIAGNOSIS — G620 Drug-induced polyneuropathy: Secondary | ICD-10-CM

## 2019-09-08 DIAGNOSIS — T451X5A Adverse effect of antineoplastic and immunosuppressive drugs, initial encounter: Secondary | ICD-10-CM

## 2019-09-08 NOTE — Progress Notes (Signed)
I connected with Donna May on 09/08/19 at 12:00 PM EDT by telephone visit and verified that I am speaking with the correct person using two identifiers.  I discussed the limitations, risks, security and privacy concerns of performing an evaluation and management service by telemedicine and the availability of in-person appointments. I also discussed with the patient that there may be a patient responsible charge related to this service. The patient expressed understanding and agreed to proceed.  Other persons participating in the visit and their role in the encounter:  n/a  Patient's location:  Home  Provider's location:  Office  Chief Complaint:  Chemotherapy-induced neuropathy ([place])  History of Present Ilness: Donna May describes no improvement with recent intervention, Lyrica.  The medication made her feel drowsy and confused, so she stopped it after 2 weeks.  She has decided to resume gabapentin.  Otherwise no progression of deficits and no new deficits described. Observations: Language and cognition normal Assessment and Plan: Chemotherapy-induced neuropathy ([place]) Recommend resuming gabapentin 300mg  HS, as discussed.  Gave consideration to initiating Elavil but declined at this time. Follow Up Instructions: Will return to clinic as scheduled next month  I discussed the assessment and treatment plan with the patient.  The patient was provided an opportunity to ask questions and all were answered.  The patient agreed with the plan and demonstrated understanding of the instructions.    The patient was advised to call back or seek an in-person evaluation if the symptoms worsen or if the condition fails to improve as anticipated.  I provided 5-10 minutes of non-face-to-face time during this enocunter.  Ventura Sellers, MD   I provided 15 minutes of non face-to-face telephone visit time during this encounter, and > 50% was spent counseling as documented under my assessment & plan.

## 2019-09-13 NOTE — Progress Notes (Signed)
[place]   Telephone:([phone] Fax:([phone]) [phone]   Clinic Follow up Note   Patient Care Team: [name], MD as PCP - General (Family Medicine) [name], [name], MD as Consulting Physician (Cardiology) [name], MD as Consulting Physician (Obstetrics and Gynecology) [name], [name], NP as Nurse Practitioner (Hematology and Oncology) [facility] [name], MD as Consulting Physician (Hematology) [name], [name], MD as Consulting Physician (Gastroenterology) Virgina Evener, Dawn, RN (Inactive) as Oncology Nurse Navigator [name], MD as Consulting Physician (General Surgery)  Date of Service:  09/20/2019  CHIEF COMPLAINT: F/u ofcholangiocarcinomaof liver  SUMMARY OF ONCOLOGIC HISTORY: Oncology History  Malignant neoplasm of female breast ([place])  11/06/2018 Genetic Testing   Negative genetic testing on the [facility] Common Hereditary Cancers panel. A variant of uncertain significance was identified in one of her APC genes, called c.1243G>A (p.Ala415Thr).  The Common Hereditary Cancers Panel offered by [facility] includes sequencing and/or deletion duplication testing of the following 48 genes: APC, ATM, AXIN2, BARD1, BMPR1A, BRCA1, BRCA2, BRIP1, CDH1, CDK4, CDKN2A (p14ARF), CDKN2A (p16INK4a), CHEK2, CTNNA1, DICER1, EPCAM (Deletion/duplication testing only), GREM1 (promoter region deletion/duplication testing only), KIT, MEN1, MLH1, MSH2, MSH3, MSH6, MUTYH, NBN, NF1, NHTL1, PALB2, PDGFRA, PMS2, POLD1, POLE, PTEN, RAD50, RAD51C, RAD51D, RNF43, SDHB, SDHC, SDHD, SMAD4, SMARCA4. STK11, TP53, TSC1, TSC2, and VHL.  The following genes were evaluated for sequence changes only: SDHA and HOXB13 c.251G>A variant only.    Intrahepatic cholangiocarcinoma ([place])  09/07/2018 Imaging   CT Chest IMPRESSION: 1. New, enhancing mass involving segment 4 of the liver and fundus of gallbladder is concerning for malignancy. This may represent either  metastatic disease from breast cancer or neoplasm primary to the liver or hepatic biliary tree. Further evaluation with contrast enhanced CT of the abdomen and pelvis is recommended. 2. No findings to suggest metastatic disease within the chest. 3.  Aortic Atherosclerosis (ICD10-I70.0). 4. Coronary artery calcifications.   09/13/2018 Pathology Results   Diagnosis Liver, needle/core biopsy - ADENOCARCINOMA. Microscopic Comment Immunohistochemistry for CK7 is positive. CK20, TTF1, CDX-2, GATA-3, PAX 8, Qualitative ER, p63 and CK5/6 are negative. The provided clinical history of remote mammary carcinoma is noted. Based on the morphology and immunophenotype of the adenocarcinoma observed in this specimen, primary cholangiocarcinoma is favored. Clinical and radiologic correlation are  encouraged. Results reported to [employer] on 09/15/2018. Intradepartmental consultation (Dr. Vic Ripper).   09/13/2018 Initial Diagnosis   Cholangiocarcinoma ([place])   09/23/2018 Procedure   Colonoscopy by Dr. Havery Moros 09/23/18  IMPRESSION - Two 3 to 4 mm polyps in the ascending colon, removed with a cold snare. Resected and retrieved. - Five 3 to 5 mm polyps in the transverse colon, removed with a cold snare. Resected and retrieved. - One 5 mm polyp at the splenic flexure, removed with a cold snare. Resected and retrieved. - Three 3 to 5 mm polyps in the sigmoid colon, removed with a cold snare. Resected and retrieved. - The examination was otherwise normal. Upper Endopscy by Dr. Havery Moros 09/23/18  IMPRESSION - Esophagogastric landmarks identified. - 2 cm hiatal hernia. - Normal esophagus otherwise. - A single gastric polyp. Resected and retrieved. - Mild gastritis. Biopsied. - Normal duodenal bulb and second portion of the duodenum.   09/23/2018 Pathology Results   Diagnosis 09/23/18 1. Surgical [P], duodenum - BENIGN SMALL BOWEL MUCOSA. - NO ACTIVE INFLAMMATION OR VILLOUS ATROPHY IDENTIFIED. 2.  Surgical [P], stomach, polyp - HYPERPLASTIC POLYP(S). - THERE IS NO EVIDENCE OF MALIGNANCY. 3. Surgical [P], gastric antrum and gastric  body - CHRONIC INACTIVE GASTRITIS. - THERE IS NO EVIDENCE OF HELICOBACTER-PYLORI, DYSPLASIA, OR MALIGNANCY. - SEE COMMENT. 4. Surgical [P], colon, sigmoid, splenic flexure, transverse and ascending, polyp (9) - TUBULAR ADENOMA(S). - SESSILE SERRATED POLYP WITHOUT CYTOLOGIC DYSPLASIA. - HIGH GRADE DYSPLASIA IS NOT IDENTIFIED. 5. Surgical [P], colon, sigmoid, polyp (2) - HYPERPLASTIC POLYP(S). - THERE IS NO EVIDENCE OF MALIGNANCY.   09/23/2018 Cancer Staging   Staging form: Intrahepatic Bile Duct, AJCC 8th Edition - Clinical stage from 09/23/2018: Stage IB (cT1b, cN0, cM0) - Signed by Efosa Treichler, MD on 10/06/2018   09/29/2018 PET scan   PET 09/29/18 IMPRESSION: 1. Hypermetabolic mass in the RIGHT hepatic lobe consistent with biopsy proven adenocarcinoma. No additional liver metastasis. 2. No evidence of local breast cancer recurrence in the RIGHT breast or RIGHT axilla. 3. Mild bilateral hypermetabolic adrenal glands is favored benign hyperplasia. 4. No evidence of additional metastatic disease on skull base to thigh FDG PET scan.   11/06/2018 Genetic Testing   Negative genetic testing on the [facility] Common Hereditary Cancers panel. A variant of uncertain significance was identified in one of her APC genes, called c.1243G>A (p.Ala415Thr).  The Common Hereditary Cancers Panel offered by [facility] includes sequencing and/or deletion duplication testing of the following 48 genes: APC, ATM, AXIN2, BARD1, BMPR1A, BRCA1, BRCA2, BRIP1, CDH1, CDK4, CDKN2A (p14ARF), CDKN2A (p16INK4a), CHEK2, CTNNA1, DICER1, EPCAM (Deletion/duplication testing only), GREM1 (promoter region deletion/duplication testing only), KIT, MEN1, MLH1, MSH2, MSH3, MSH6, MUTYH, NBN, NF1, NHTL1, PALB2, PDGFRA, PMS2, POLD1, POLE, PTEN, RAD50, RAD51C, RAD51D, RNF43, SDHB, SDHC, SDHD, SMAD4, SMARCA4.  STK11, TP53, TSC1, TSC2, and VHL.  The following genes were evaluated for sequence changes only: SDHA and HOXB13 c.251G>A variant only.    11/17/2018 Pathology Results   Diagnosis 11/17/18 1. Soft tissue, biopsy, Diaphragmatic nodules - METASTATIC ADENOCARCINOMA, CONSISTENT WITH PATIENT'S CLINICAL HISTORY OF CHOLANGIOCARCINOMA. SEE NOTE 2. Liver, biopsy, Left - LIVER PARENCHYMA WITH A BENIGN FIBROTIC NODULE - NO EVIDENCE OF MALIGNANCY 3. Stomach, biopsy - BENIGN PAPILLARY MESOTHELIAL HYPERPLASIA - NO EVIDENCE OF MALIGNANCY   11/29/2018 Imaging   CT CAP WO Contrast  IMPRESSION: 1. Dominant liver mass appears grossly stable from 09/29/2018. Additional liver lesions are too small to characterize but were not shown to be hypermetabolic on PET. 2. Mild nodularity of both adrenal glands with associated hypermetabolism on 09/29/2018. Continued attention on follow-up exams is warranted. 3. Small right lower lobe nodules, stable from 09/07/2018. Again, attention on follow-up is recommended. 4. Trace bilateral pleural fluid. 5. Aortic atherosclerosis (ICD10-170.0). Coronary artery calcification. 6. Enlarged pulmonic trunk, indicative of pulmonary arterial hypertension.     11/30/2018 - 06/14/2019 Chemotherapy   First line chemo Oxaliplatin and gemcitabine q2weeks starting 11/30/18. Stopped Oxaliplatin on 05/03/19 due to worsening neuropathy. Added Cisplatin with C13 on 05/17/19 and stopped on C15 06/14/19 due to neuropathy. Reduced to maintenance single agent Gemcitabine on 06/28/19.    01/20/2019 Imaging   CT CAP IMPRESSION: restaging  1. Mild interval increase in size of dominant liver mass involving segment 4 and segment 5. 2. No significant or progressive adrenal nodularity identified to suggest metastatic disease. 3. Unchanged appearance of small right lower lobe and lingular lung nodules, nonspecific.   03/21/2019 PET scan   IMPRESSION: 1. Large right hepatic mass with SUV uptake near  background hepatic activity, dramatic response to therapy, also with decrease in size when compared to the prior study. 2. Signs of prior right mastectomy and axillary dissection as before. 3. Left adrenal activity in no longer above   the level of activity seen in the contralateral, right adrenal gland or in the liver. Attention on follow-up. 4. No new signs of disease.   06/27/2019 PET scan   IMPRESSION: 1. Further decrease in size of a dominant hepatic mass which is non FDG avid. 2. No evidence of metastatic disease. 3. No evidence of left adrenal hypermetabolism or mass. 4. Incidental findings, including: Uterine fibroids. Coronary artery atherosclerosis. Aortic Atherosclerosis (ICD10-I70.0). Pulmonary artery enlargement suggests pulmonary arterial hypertension.   06/28/2019 -  Chemotherapy   Maintenance single agent Gemcitabine q2weeks  starting on 06/28/19 with C16.    09/15/2019 PET scan   IMPRESSION: 1. In the region of regional tumor at the junction of the right and left hepatic lobe, there is continued hypodensity and overall activity slightly less than that of the surrounding normal liver, indicating effective response to therapy. No current worrisome hypermetabolic lesion is identified in the liver or elsewhere. 2. Chronic asymmetric edema in the subcutaneous tissues of the right upper extremity, nonspecific. The patient has had a prior right mastectomy and right axillary dissection. 3. Other imaging findings of potential clinical significance: Aortic Atherosclerosis (ICD10-I70.0). Coronary atherosclerosis. Mild cardiomegaly. Small right and trace left pleural effusions. Subcutaneous and mild mesenteric edema. Suspected uterine fibroids.      CURRENT THERAPY:  Maintenance single agent Gemcitabine q2weeks starting on 06/28/19 with C16.  INTERVAL HISTORY:  Donna May is here for a follow up and treatment. She presents to the clinic alone. She notes her neuropathy in  her hands and feet is stable. She has not been taking Gabapentin in the past week due to drowsiness and which makes her sleep too long. She was taking 2-3 '100mg'$  tab at night. She is considering seeing another neurologist for second opinion. Medication he previously started her own caused dizziness so she stopped. She has completed PT but has not done OT.  She notes sometimes when she breaths she can hear herself wheezing. She is not planning to return to work given her neuropathy but her paperwork is still in out office. She needs her paperwork to be updated and note permanent disability.    REVIEW OF SYSTEMS:   Constitutional: Denies fevers, chills or abnormal weight loss Eyes: Denies blurriness of vision Ears, nose, mouth, throat, and face: Denies mucositis or sore throat Respiratory: Denies cough, dyspnea (+) wheezes Cardiovascular: Denies palpitation, chest discomfort or lower extremity swelling Gastrointestinal:  Denies nausea, heartburn or change in bowel habits Skin: Denies abnormal skin rashes Lymphatics: Denies new lymphadenopathy or easy bruising Neurological: (+) stable neuropathy.  Behavioral/Psych: Mood is stable, no new changes  All other systems were reviewed with the patient and are negative.  MEDICAL HISTORY:  Past Medical History:  Diagnosis Date  . Anemia   . Anxiety   . Arthritis   . Breast cancer ([place]) 1992  . Cataract    Bilateral eyes - surgery to remove  . Chronic kidney disease    CKD stage 3  . Complication of anesthesia    "something they use make me itch for a couple of days."  . Depression   . Diabetes mellitus    type 2  . Duodenitis 01/18/2002  . Fainting spell   . Family history of breast cancer   . Family history of prostate cancer   . GERD (gastroesophageal reflux disease)   . Heart murmur    never has caused any problems  . Hiatal hernia 08/08/2008, 01/18/2002  . History of pneumonia    x  2  . Hyperlipidemia   . Hypertension   . Liver  cancer ([facility]) 08/2018  . Lymphedema 2017   Right arm  . Pneumonia    x 2  . UTI (lower urinary tract infection)     SURGICAL HISTORY: Past Surgical History:  Procedure Laterality Date  . AXILLARY SURGERY     cyst removal, right  . COLONOSCOPY  09/23/2018   Dr. Adela Lank - polyps  . EYE SURGERY Bilateral    cataracts to remove  . LAPAROSCOPY N/A 11/17/2018   Procedure: LAPAROSCOPY DIAGNOSTIC, INTRAOPERATIVE ULTRASOUND, PERITONEAL BIOPSIES;  Surgeon: Almond Lint, MD;  Location: [facility] OR;  Service: General;  Laterality: N/A;  GENERAL AND EPIDURAL  . LIVER BIOPSY  08/2018   Dr. Rip Harbour  . MASTECTOMY  1992   right, with flap  . NM MYOCAR PERF WALL MOTION  06/11/2009   Protocol:[place]ruce, post stress EF58%, EKG negative for ischemia, low risk  . PORTACATH PLACEMENT N/A 12/02/2018   Procedure: INSERTION PORT-A-CATH;  Surgeon: Almond Lint, MD;  Location: [facility] OR;  Service: General;  Laterality: N/A;  . RECONSTRUCTION BREAST W/ TRAM FLAP Right   . TONSILLECTOMY    . TRANSTHORACIC ECHOCARDIOGRAM  12/24/2009   LVEF =>55%, normal study  . UPPER GI ENDOSCOPY      I have reviewed the social history and family history with the patient and they are unchanged from previous note.  ALLERGIES:  has No Known Allergies.  MEDICATIONS:  Current Outpatient Medications  Medication Sig Dispense Refill  . amLODipine (NORVASC) 10 MG tablet TAKE 1 TABLET BY MOUTH EVERY DAY 90 tablet 3  . aspirin 81 MG tablet Take 81 mg by mouth at bedtime.     . Calcium Carbonate-Vitamin D 600-200 MG-UNIT TABS Take 1 tablet by mouth daily.    Marland Kitchen CALCIUM-MAGNESIUM-VITAMIN D PO Take 1 tablet by mouth once a week.     . Cholecalciferol (VITAMIN D3) 2000 UNITS TABS Take 1 tablet by mouth once a week.     . DULoxetine (CYMBALTA) 30 MG capsule Take 1 capsule (30 mg total) by mouth daily. 90 capsule 1  . Echinacea-Goldenseal (ECHINACEA COMB/GOLDEN SEAL) CAPS Take by mouth.    Marland Kitchen glucose blood test strip 1 each by Other route 2  (two) times daily. Use Onetouch verio test strips as instructed to check blood sugar twice daily. 100 each 2  . hydrochlorothiazide (HYDRODIURIL) 25 MG tablet Take 1 tablet (25 mg total) by mouth daily. 90 tablet 3  . HYDROcodone-acetaminophen (NORCO/VICODIN) 5-325 MG tablet Take 1 tablet by mouth every 6 (six) hours as needed for moderate pain. 10 tablet 0  . ketoconazole (NIZORAL) 2 % cream Apply 1 fingertip amount to each foot daily. 30 g 0  . lansoprazole (PREVACID) 15 MG capsule Take 1 capsule (15 mg total) by mouth daily. 90 capsule 1  . lidocaine-prilocaine (EMLA) cream Apply to affected area once 90 g 1  . loratadine (CLARITIN) 10 MG tablet Take 10 mg by mouth daily as needed for allergies.    . metFORMIN (GLUCOPHAGE-XR) 500 MG 24 hr tablet Take 1 tablet (500 mg total) by mouth 2 (two) times daily before a meal. 180 tablet 1  . metoprolol succinate (TOPROL-XL) 100 MG 24 hr tablet Take 1 tablet (100 mg total) by mouth daily. Take with or immediately following a meal. 90 tablet 1  . Omega 3 1000 MG CAPS Take 2,000 mg by mouth daily.     . ondansetron (ZOFRAN) 8 MG tablet Take 1 tablet (8  mg total) by mouth every 8 (eight) hours as needed for nausea or vomiting. 30 tablet 0  . pregabalin (LYRICA) 75 MG capsule Take 1 capsule (75 mg total) by mouth 2 (two) times daily. 60 capsule 1  . Prenatal Vit-Fe Fumarate-FA (MULTIVITAMIN-PRENATAL) 27-0.8 MG TABS tablet Take 1 tablet by mouth daily at 12 noon.    . prochlorperazine (COMPAZINE) 10 MG tablet Take 1 tablet (10 mg total) by mouth every 6 (six) hours as needed for nausea or vomiting. 30 tablet 0  . raloxifene (EVISTA) 60 MG tablet TAKE 1 TABLET DAILY 90 tablet 0  . rosuvastatin (CRESTOR) 20 MG tablet Take 1 tablet (20 mg total) by mouth daily. 90 tablet 1  . sitaGLIPtin (JANUVIA) 100 MG tablet Take 1 tablet (100 mg total) by mouth daily. 90 tablet 4  . triamcinolone cream (KENALOG) 0.1 % Apply 1 application topically 2 (two) times daily. 30 g 0    . valsartan (DIOVAN) 320 MG tablet Take 1 tablet (320 mg total) by mouth daily. 90 tablet 3   Current Facility-Administered Medications  Medication Dose Route Frequency Provider Last Rate Last Admin  . triamcinolone acetonide (KENALOG) 10 MG/ML injection 10 mg  10 mg Other Once Harriet Masson, DPM       Facility-Administered Medications Ordered in Other Visits  Medication Dose Route Frequency Provider Last Rate Last Admin  . gemcitabine (GEMZAR) 1,064 mg in sodium chloride 0.9 % 250 mL chemo infusion  600 mg/m2 (Treatment Plan Recorded) Intravenous Once [name], MD      . heparin lock flush 100 unit/mL  500 Units Intracatheter Once PRN [name], MD      . prochlorperazine (COMPAZINE) tablet 10 mg  10 mg Oral Once [name], MD      . sodium chloride flush (NS) 0.9 % injection 10 mL  10 mL Intracatheter PRN [name], MD        PHYSICAL EXAMINATION: ECOG PERFORMANCE STATUS: 2 - Symptomatic, <50% confined to bed  Vitals:   09/20/19 0837  BP: (!) 203/91  Pulse: 74  Resp: 17  Temp: 99.5 F (37.5 C)  SpO2: 97%   Filed Weights   09/20/19 0837  Weight: 125 lb 9.6 oz (57 kg)    GENERAL:alert, no distress and comfortable SKIN: skin color, texture, turgor are normal, no rashes or significant lesions EYES: normal, Conjunctiva are pink and non-injected, sclera clear  NECK: supple, thyroid normal size, non-tender, without nodularity LYMPH:  no palpable lymphadenopathy in the cervical, axillary  LUNGS: clear to auscultation and percussion with normal breathing effort HEART: regular rate & rhythm and no murmurs and no lower extremity edema ABDOMEN:abdomen soft, non-tender and normal bowel sounds Musculoskeletal:no cyanosis of digits and no clubbing  NEURO: alert & oriented x 3 with fluent speech, no focal motor/sensory deficits  LABORATORY DATA:  I have reviewed the data as listed CBC Latest Ref Rng & Units 09/20/2019 09/06/2019 08/23/2019  WBC 4.0 - 10.5 K/uL 8.1 6.1 6.7  Hemoglobin  12.0 - 15.0 g/dL 7.5(L) 8.0(L) 8.6(L)  Hematocrit 36 - 46 % 23.7(L) 25.7(L) 27.1(L)  Platelets 150 - 400 K/uL 330 177 176     CMP Latest Ref Rng & Units 09/20/2019 09/06/2019 08/23/2019  Glucose 70 - 99 mg/dL 93 168(H) 208(H)  BUN 8 - 23 mg/dL '18 16 21  '$ Creatinine 0.44 - 1.00 mg/dL 1.44(H) 1.24(H) 1.56(H)  Sodium 135 - 145 mmol/L 141 142 144  Potassium 3.5 - 5.1 mmol/L 4.0 3.6 3.8  Chloride 98 -  111 mmol/L 108 110 106  CO2 22 - 32 mmol/L '22 23 23  '$ Calcium 8.9 - 10.3 mg/dL 8.9 8.4(L) 9.0  Total Protein 6.5 - 8.1 g/dL 7.0 6.6 6.8  Total Bilirubin 0.3 - 1.2 mg/dL 0.6 0.6 0.4  Alkaline Phos 38 - 126 U/L 53 53 45  AST 15 - 41 U/L '27 22 24  '$ ALT 0 - 44 U/L '7 9 8      '$ RADIOGRAPHIC STUDIES: I have personally reviewed the radiological images as listed and agreed with the findings in the report. No results found.   ASSESSMENT & PLAN:  Donna May is a 79 y.o. female with   1.Intrahepatic cholangiocarcinoma, cT1N0M1,with peritoneal metastasis, MSS, IDH1 mutation (+) -She was diagnosed in 08/2018. Her biopsy of her liver mass shows adenocarcinoma,most consistent withcholangiocarcinoma.Her EGD/Colonoscopy from 7/10/20was negativeand 09/29/18 PET scanshowedno evidence of distant metastasis. -She was brought to OR on 9/3,unfortunately she was found to have peritoneal metastasis to right diaphragm and surgery was aborted. -Given her metastatic cancer, Istarted her onsystemicchemo withFirst line chemoOxaliplatinand gemcitabine q2weeksto control her disease.She started on 11/30/18.Stopped Oxaliplatin on 05/03/19 due to worsening neuropathy.AddedCisplatin with C13 on 05/17/19.Due to progressing neuropathy cisplatin was stopped on 06/14/19 and she proceeding with single agent Gemcitabineevery 2 weeksstarting 06/28/19 with C16. -I reviewed and discussed her PET from 09/15/19 in person with pt which shows liver mass, with low activity indicating effective response to therapy. No metastasis on  PET.  -We again reviewed management of her Neuropathy which has impacted her daily activities and remains to be her main concerns. She has spent some days solely in bed because of this. I encourage her to remain physically active, getting up daily to walk around. I discussed if she is persistently in bed all day, I will stop chemo if her performance status is not adequate enough to manage on chemo. She voiced good understanding and is still wishes to continue chemo as this is tolerable and controlling her disease.  -Labs reviewed, CBC and CMP WNL except Hg 7.5, Cr 1.44, albumin 3.2. Mag normal. Overall adequate to proceed with Gemcitabine today. Given anemia, will further dose reduce her Gemcitabine.  -I discussed the option of changing her chemo to Oral chemo Xeloda. This is less effective than Gemcitabine but has less side effects and impact on her blood counts. She opted to continue current regimen for now.  -Continue Gemcitabine every 2 weeks.  -F/u in 2 weeks.    2. Anemia -Secondary to chemo -Shepreviouslyrequiredblood transfusionon1/13/21 and again on 08/16/19. Will continue prenatal vitamin.Her MMA was normal (08/09/19) -Lab show hg is 7.5 today (09/20/19). Will given another blood transfusion this week, she is agreeable.   3. Neuropathy, G3 -secondary to Oxaliplatin, dose was reduced and eventually dc'd after cycle 11 -started cisplatin on 05/17/19, neuropathy worsened in her feet with unsteady gait and functional limitations. Cisplatin was dose reduced then eventually stopped on 06/14/19.  -She was previously given Lyrica and Cymbalta and stopped possibly due to balance issues and dizziness. She has had drowsiness from Gabapentin so I suggest reducing '100mg'$  Gabapentin to 1-2 tab at night only.  -She has completed PT. I will check on her OT as her daily functions are significantly effected.  -She has been referred to Dr. Mickeal Skinner for further evaluation and management, but is looking for a  second opinion. I will refer her to Dr. Evelena Leyden  4. H/o right breast cancer, Geneticsnegative -s/p mastectomy in 1992, per patient did not require adjuvant therapy  -followed  by Dr. Jana Hakim -Her genetic testing wasnegativefor pathogeneticmutations. She did have VUS of gene APC. -She was seen to have right arm lymphedema on her 09/15/19 PET scan.   5.Comorbidities:DM,HTN,hiatal hernia, GERD, renal artery stenosis -f/u with PCP -She did not tolerate Glucerna, she will continue low sugar protein shakes. -She is on amlodipine and HTCZ, metoprolol and losartan. HTN uncontrolled. I encouraged her to f/u with PCP.  -Her HTCZ was held due to elevated Cr. Will restart today given worsened BP at 203/91 (09/20/19). I advised her to have her PCP adjust her HTN medications moving forward.   6. Family support, Goal of Care Discussion  -She has a son and daughterwho are often busy but help as needed.  -She notes they are aware of her condition. Iencouraged her to keep her children updated and ask them for more home support. -The patient understands the goal of care is palliative. -she is full code now  7. CKD stage III -I strongly encouraged her to drink more water at least 40 ounces daily to maintain kidney function. -Was improving but has increased recently.Continue to monitor.Stable Cr at 1.2-15. -NP Lacie previously stopped her HCTZ on 08/23/19, she has been having hand swelling and elevated BP today at 203/91. I will restart her HCTZ and  today.    PLAN: -PET scan reviewed, good response to chemo  -Labs reviewed and adequate to proceed with Gemcitabine today, will reduce her dose further to '60mg'$ /m2 due to her anemia and low PS   -blood transfusion this week, 7/10 -Lab, flush, f/u and Gemcitabine in 2, 4 weeks  -Send referral to Dr neurologist Dr. Krista Blue for second opinion on peripheral neuropathy management -update on work paperwork FMLA   No problem-specific Assessment & Plan  notes found for this encounter.   Orders Placed This Encounter  Procedures  . Ambulatory referral to Neurology    Referral Priority:   Routine    Referral Type:   Consultation    Referral Reason:   Specialty Services Required    Requested Specialty:   Neurology    Number of Visits Requested:   1  . Type and screen    Standing Status:   Future    Standing Expiration Date:   09/19/2020   All questions were answered. The patient knows to call the clinic with any problems, questions or concerns. No barriers to learning was detected. The total time spent in the appointment was 40 minutes.     [name], MD 09/20/2019   I, Joslyn Devon, am acting as scribe for [name], MD.   I have reviewed the above documentation for accuracy and completeness, and I agree with the above.

## 2019-09-15 ENCOUNTER — Ambulatory Visit (HOSPITAL_COMMUNITY)
Admission: RE | Admit: 2019-09-15 | Discharge: 2019-09-15 | Disposition: A | Discharge status: Home / Self Care | Payer: Medicare Other | Source: Ambulatory Visit | Attending: Hematology | Admitting: Hematology

## 2019-09-15 ENCOUNTER — Other Ambulatory Visit: Payer: Self-pay

## 2019-09-15 DIAGNOSIS — I700 Atherosclerosis of aorta: Secondary | ICD-10-CM | POA: Diagnosis not present

## 2019-09-15 DIAGNOSIS — C221 Intrahepatic bile duct carcinoma: Secondary | ICD-10-CM | POA: Diagnosis present

## 2019-09-15 DIAGNOSIS — J90 Pleural effusion, not elsewhere classified: Secondary | ICD-10-CM | POA: Diagnosis not present

## 2019-09-15 DIAGNOSIS — I2510 Atherosclerotic heart disease of native coronary artery without angina pectoris: Secondary | ICD-10-CM | POA: Insufficient documentation

## 2019-09-15 DIAGNOSIS — I517 Cardiomegaly: Secondary | ICD-10-CM | POA: Insufficient documentation

## 2019-09-15 LAB — GLUCOSE, CAPILLARY: Glucose-Capillary: 105 mg/dL — ABNORMAL HIGH (ref 70–99)

## 2019-09-15 MED ORDER — FLUDEOXYGLUCOSE F - 18 (FDG) INJECTION
6.7300 | Freq: Once | INTRAVENOUS | Status: AC | PRN
  Administered 2019-09-15: 6.73 via INTRAVENOUS

## 2019-09-20 ENCOUNTER — Other Ambulatory Visit: Payer: Self-pay

## 2019-09-20 ENCOUNTER — Encounter: Payer: Self-pay | Admitting: Hematology

## 2019-09-20 ENCOUNTER — Inpatient Hospital Stay: Payer: Medicare Other

## 2019-09-20 ENCOUNTER — Inpatient Hospital Stay (HOSPITAL_BASED_OUTPATIENT_CLINIC_OR_DEPARTMENT_OTHER): Payer: Medicare Other | Admitting: Hematology

## 2019-09-20 ENCOUNTER — Inpatient Hospital Stay: Payer: Medicare Other | Attending: Adult Health

## 2019-09-20 ENCOUNTER — Telehealth: Payer: Self-pay

## 2019-09-20 ENCOUNTER — Telehealth: Payer: Self-pay | Admitting: Hematology

## 2019-09-20 VITALS — BP 203/91 | HR 74 | Temp 99.5°F | Resp 17 | Ht 65.0 in | Wt 125.6 lb

## 2019-09-20 VITALS — BP 157/68 | HR 69

## 2019-09-20 DIAGNOSIS — G620 Drug-induced polyneuropathy: Secondary | ICD-10-CM

## 2019-09-20 DIAGNOSIS — C221 Intrahepatic bile duct carcinoma: Secondary | ICD-10-CM

## 2019-09-20 DIAGNOSIS — D649 Anemia, unspecified: Secondary | ICD-10-CM

## 2019-09-20 DIAGNOSIS — E1122 Type 2 diabetes mellitus with diabetic chronic kidney disease: Secondary | ICD-10-CM

## 2019-09-20 DIAGNOSIS — N1832 Chronic kidney disease, stage 3b: Secondary | ICD-10-CM

## 2019-09-20 DIAGNOSIS — Z95828 Presence of other vascular implants and grafts: Secondary | ICD-10-CM

## 2019-09-20 DIAGNOSIS — G629 Polyneuropathy, unspecified: Secondary | ICD-10-CM

## 2019-09-20 DIAGNOSIS — Z5111 Encounter for antineoplastic chemotherapy: Secondary | ICD-10-CM | POA: Insufficient documentation

## 2019-09-20 DIAGNOSIS — C786 Secondary malignant neoplasm of retroperitoneum and peritoneum: Secondary | ICD-10-CM | POA: Insufficient documentation

## 2019-09-20 DIAGNOSIS — T451X5A Adverse effect of antineoplastic and immunosuppressive drugs, initial encounter: Secondary | ICD-10-CM

## 2019-09-20 LAB — CMP (CANCER CENTER ONLY)
ALT: 7 U/L (ref 0–44)
AST: 27 U/L (ref 15–41)
Albumin: 3.2 g/dL — ABNORMAL LOW (ref 3.5–5.0)
Alkaline Phosphatase: 53 U/L (ref 38–126)
Anion gap: 11 (ref 5–15)
BUN: 18 mg/dL (ref 8–23)
CO2: 22 mmol/L (ref 22–32)
Calcium: 8.9 mg/dL (ref 8.9–10.3)
Chloride: 108 mmol/L (ref 98–111)
Creatinine: 1.44 mg/dL — ABNORMAL HIGH (ref 0.44–1.00)
GFR, Est AFR Am: 40 mL/min — ABNORMAL LOW (ref >60)
GFR, Estimated: 34 mL/min — ABNORMAL LOW (ref >60)
Glucose, Bld: 93 mg/dL (ref 70–99)
Potassium: 4 mmol/L (ref 3.5–5.1)
Sodium: 141 mmol/L (ref 135–145)
Total Bilirubin: 0.6 mg/dL (ref 0.3–1.2)
Total Protein: 7 g/dL (ref 6.5–8.1)

## 2019-09-20 LAB — CBC WITH DIFFERENTIAL (CANCER CENTER ONLY)
Abs Immature Granulocytes: 0.05 10*3/uL (ref 0.00–0.07)
Basophils Absolute: 0 10*3/uL (ref 0.0–0.1)
Basophils Relative: 0 %
Eosinophils Absolute: 0.3 10*3/uL (ref 0.0–0.5)
Eosinophils Relative: 3 %
HCT: 23.7 % — ABNORMAL LOW (ref 36.0–46.0)
Hemoglobin: 7.5 g/dL — ABNORMAL LOW (ref 12.0–15.0)
Immature Granulocytes: 1 %
Lymphocytes Relative: 13 %
Lymphs Abs: 1 10*3/uL (ref 0.7–4.0)
MCH: 32.2 pg (ref 26.0–34.0)
MCHC: 31.6 g/dL (ref 30.0–36.0)
MCV: 101.7 fL — ABNORMAL HIGH (ref 80.0–100.0)
Monocytes Absolute: 1.1 10*3/uL — ABNORMAL HIGH (ref 0.1–1.0)
Monocytes Relative: 14 %
Neutro Abs: 5.6 10*3/uL (ref 1.7–7.7)
Neutrophils Relative %: 69 %
Platelet Count: 330 10*3/uL (ref 150–400)
RBC: 2.33 MIL/uL — ABNORMAL LOW (ref 3.87–5.11)
RDW: 19.9 % — ABNORMAL HIGH (ref 11.5–15.5)
WBC Count: 8.1 10*3/uL (ref 4.0–10.5)
nRBC: 0 % (ref 0.0–0.2)

## 2019-09-20 LAB — MAGNESIUM: Magnesium: 2.2 mg/dL (ref 1.7–2.4)

## 2019-09-20 MED ORDER — SODIUM CHLORIDE 0.9 % IV SOLN
Freq: Once | INTRAVENOUS | Status: AC
  Filled 2019-09-20: qty 250

## 2019-09-20 MED ORDER — HEPARIN SOD (PORK) LOCK FLUSH 100 UNIT/ML IV SOLN
500.0000 [IU] | Freq: Once | INTRAVENOUS | Status: AC | PRN
  Administered 2019-09-20: 500 [IU]
  Filled 2019-09-20: qty 5

## 2019-09-20 MED ORDER — SODIUM CHLORIDE 0.9% FLUSH
10.0000 mL | INTRAVENOUS | Status: DC | PRN
  Administered 2019-09-20: 10 mL
  Filled 2019-09-20: qty 10

## 2019-09-20 MED ORDER — SODIUM CHLORIDE 0.9 % IV SOLN
600.0000 mg/m2 | Freq: Once | INTRAVENOUS | Status: AC
  Administered 2019-09-20: 988 mg via INTRAVENOUS
  Filled 2019-09-20: qty 25.98

## 2019-09-20 MED ORDER — PROCHLORPERAZINE MALEATE 10 MG PO TABS
ORAL_TABLET | ORAL | Status: AC
  Filled 2019-09-20: qty 1

## 2019-09-20 MED ORDER — PROCHLORPERAZINE MALEATE 10 MG PO TABS
10.0000 mg | ORAL_TABLET | Freq: Once | ORAL | Status: AC
  Administered 2019-09-20: 10 mg via ORAL

## 2019-09-20 MED ORDER — SODIUM CHLORIDE 0.9% FLUSH
10.0000 mL | Freq: Once | INTRAVENOUS | Status: AC
  Administered 2019-09-20: 10 mL
  Filled 2019-09-20: qty 10

## 2019-09-20 NOTE — Patient Instructions (Signed)

## 2019-09-20 NOTE — Telephone Encounter (Signed)
Left vm with sat 7/10 appt and time.  I reminded Donna May to leave the blue blood band in place.

## 2019-09-20 NOTE — Progress Notes (Signed)
Ok to use today's wt for Gemzar dose calculation per Dr. Burr Medico.  Kennith Center, Pharm.D., CPP 09/20/2019@10 :36 AM

## 2019-09-20 NOTE — Telephone Encounter (Signed)
Scheduled per 7/7 los. Unable to reach pt. Left voicemail with appt time and date. Noted to give pt updated calendar.

## 2019-09-20 NOTE — Patient Instructions (Signed)
[facility] Discharge Instructions for Patients Receiving Chemotherapy  Today you received the following chemotherapy agents: Gemzar  To help prevent nausea and vomiting after your treatment, we encourage you to take your nausea medication as prescribed.    If you develop nausea and vomiting that is not controlled by your nausea medication, call the clinic.   BELOW ARE SYMPTOMS THAT SHOULD BE REPORTED IMMEDIATELY:  *FEVER GREATER THAN 100.5 F  *CHILLS WITH OR WITHOUT FEVER  NAUSEA AND VOMITING THAT IS NOT CONTROLLED WITH YOUR NAUSEA MEDICATION  *UNUSUAL SHORTNESS OF BREATH  *UNUSUAL BRUISING OR BLEEDING  TENDERNESS IN MOUTH AND THROAT WITH OR WITHOUT PRESENCE OF ULCERS  *URINARY PROBLEMS  *BOWEL PROBLEMS  UNUSUAL RASH Items with * indicate a potential emergency and should be followed up as soon as possible.  Feel free to call the clinic should you have any questions or concerns. The clinic phone number is [phone].  Please show the [facility] at check-in to the Emergency Department and triage nurse.     Blood Transfusion, Adult, Care After This sheet gives you information about how to care for yourself after your procedure. Your doctor may also give you more specific instructions. If you have problems or questions, contact your doctor. What can I expect after the procedure? After the procedure, it is common to have:  Bruising and soreness at the IV site.  A fever or chills on the day of the procedure. This may be your body's response to the new blood cells received.  A headache. Follow these instructions at home: Insertion site care      Follow instructions from your doctor about how to take care of your insertion site. This is where an IV tube was put into your vein. Make sure you: ? Wash your hands with soap and water before and after you change your bandage (dressing). If you cannot use soap and water, use hand sanitizer. ? Change  your bandage as told by your doctor.  Check your insertion site every day for signs of infection. Check for: ? Redness, swelling, or pain. ? Bleeding from the site. ? Warmth. ? Pus or a bad smell. General instructions  Take over-the-counter and prescription medicines only as told by your doctor.  Rest as told by your doctor.  Go back to your normal activities as told by your doctor.  Keep all follow-up visits as told by your doctor. This is important. Contact a doctor if:  You have itching or red, swollen areas of skin (hives).  You feel worried or nervous (anxious).  You feel weak after doing your normal activities.  You have redness, swelling, warmth, or pain around the insertion site.  You have blood coming from the insertion site, and the blood does not stop with pressure.  You have pus or a bad smell coming from the insertion site. Get help right away if:  You have signs of a serious reaction. This may be coming from an allergy or the body's defense system (immune system). Signs include: ? Trouble breathing or shortness of breath. ? Swelling of the face or feeling warm (flushed). ? Fever or chills. ? Head, chest, or back pain. ? Dark pee (urine) or blood in the pee. ? Widespread rash. ? Fast heartbeat. ? Feeling dizzy or light-headed. You may receive your blood transfusion in an outpatient setting. If so, you will be told whom to contact to report any reactions. These symptoms may be an emergency. Do not  wait to see if the symptoms will go away. Get medical help right away. Call your local emergency services (911 in the [place] Do not drive yourself to the hospital. Summary  Bruising and soreness at the IV site are common.  Check your insertion site every day for signs of infection.  Rest as told by your doctor. Go back to your normal activities as told by your doctor.  Get help right away if you have signs of a serious reaction. This information is not intended  to replace advice given to you by your health care provider. Make sure you discuss any questions you have with your health care provider. Document Revised: 08/25/2018 Document Reviewed: 08/25/2018 Elsevier Patient Education  [place].

## 2019-09-21 ENCOUNTER — Other Ambulatory Visit: Payer: Self-pay

## 2019-09-21 DIAGNOSIS — D649 Anemia, unspecified: Secondary | ICD-10-CM

## 2019-09-22 ENCOUNTER — Other Ambulatory Visit: Payer: Self-pay

## 2019-09-22 DIAGNOSIS — D649 Anemia, unspecified: Secondary | ICD-10-CM

## 2019-09-23 ENCOUNTER — Other Ambulatory Visit: Payer: Self-pay

## 2019-09-23 ENCOUNTER — Inpatient Hospital Stay: Payer: Medicare Other

## 2019-09-23 VITALS — BP 165/84 | HR 62 | Temp 98.7°F | Resp 18

## 2019-09-23 DIAGNOSIS — C786 Secondary malignant neoplasm of retroperitoneum and peritoneum: Secondary | ICD-10-CM | POA: Diagnosis not present

## 2019-09-23 DIAGNOSIS — C221 Intrahepatic bile duct carcinoma: Secondary | ICD-10-CM | POA: Diagnosis not present

## 2019-09-23 DIAGNOSIS — D649 Anemia, unspecified: Secondary | ICD-10-CM

## 2019-09-23 DIAGNOSIS — Z5111 Encounter for antineoplastic chemotherapy: Secondary | ICD-10-CM | POA: Diagnosis not present

## 2019-09-23 LAB — PREPARE RBC (CROSSMATCH)

## 2019-09-23 MED ORDER — DIPHENHYDRAMINE HCL 25 MG PO CAPS
25.0000 mg | ORAL_CAPSULE | Freq: Once | ORAL | Status: AC
  Administered 2019-09-23: 25 mg via ORAL

## 2019-09-23 MED ORDER — DIPHENHYDRAMINE HCL 25 MG PO CAPS
ORAL_CAPSULE | ORAL | Status: AC
  Filled 2019-09-23: qty 2

## 2019-09-23 MED ORDER — HEPARIN SOD (PORK) LOCK FLUSH 100 UNIT/ML IV SOLN
500.0000 [IU] | Freq: Every day | INTRAVENOUS | Status: AC | PRN
  Administered 2019-09-23: 500 [IU]
  Filled 2019-09-23: qty 5

## 2019-09-23 MED ORDER — SODIUM CHLORIDE 0.9% IV SOLUTION
250.0000 mL | Freq: Once | INTRAVENOUS | Status: AC
  Administered 2019-09-23: 250 mL via INTRAVENOUS
  Filled 2019-09-23: qty 250

## 2019-09-23 MED ORDER — SODIUM CHLORIDE 0.9% FLUSH
10.0000 mL | INTRAVENOUS | Status: AC | PRN
  Administered 2019-09-23: 10 mL
  Filled 2019-09-23: qty 10

## 2019-09-23 NOTE — Patient Instructions (Signed)

## 2019-09-25 ENCOUNTER — Telehealth: Payer: Self-pay | Admitting: Internal Medicine

## 2019-09-25 LAB — BPAM RBC
Blood Product Expiration Date: 202108012359
Blood Product Expiration Date: 202108012359
ISSUE DATE / TIME: 202107100850
ISSUE DATE / TIME: 202107100850
Unit Type and Rh: 6200
Unit Type and Rh: 6200

## 2019-09-25 LAB — TYPE AND SCREEN
ABO/RH(D): A POS
Antibody Screen: NEGATIVE
Unit division: 0
Unit division: 0

## 2019-09-26 ENCOUNTER — Inpatient Hospital Stay (HOSPITAL_BASED_OUTPATIENT_CLINIC_OR_DEPARTMENT_OTHER): Payer: Medicare Other | Admitting: Internal Medicine

## 2019-09-26 DIAGNOSIS — T451X5A Adverse effect of antineoplastic and immunosuppressive drugs, initial encounter: Secondary | ICD-10-CM

## 2019-09-26 DIAGNOSIS — G620 Drug-induced polyneuropathy: Secondary | ICD-10-CM

## 2019-09-26 NOTE — Progress Notes (Signed)
I connected with [name] on 09/26/19 at 10:00 AM EDT by telephone visit and verified that I am speaking with the correct person using two identifiers.  I discussed the limitations, risks, security and privacy concerns of performing an evaluation and management service by telemedicine and the availability of in-person appointments. I also discussed with the patient that there may be a patient responsible charge related to this service. The patient expressed understanding and agreed to proceed.  Other persons participating in the visit and their role in the encounter:  n/a  Patient's location:  Home  Provider's location:  Office  Chief Complaint:  Chemotherapy-induced neuropathy ([place])  History of Present Ilness: SOFHIA ULIBARRI continues to complain of pain and numbness affecting her feet and lower legs.  She is unhappy with the gabapentin because it led to fatigue and balance issues.  She has not tolerated any of the prior medications either.  Today she requests a second opinion due to failure of therapy. Observations: Language and cognition normal Assessment and Plan: Chemotherapy-induced neuropathy ([place]) Patient has had poor tolerance and or efficacy with Gabapentin, Pregabalin and Cymbalta. We discussed the possibility of a trial with lidocaine patches, but she instead preferred to seek an outside opinion.  Dr. Burr Medico has placed referral to [facility]. Follow Up Instructions: Follow up as needed  I discussed the assessment and treatment plan with the patient.  The patient was provided an opportunity to ask questions and all were answered.  The patient agreed with the plan and demonstrated understanding of the instructions.    The patient was advised to call back or seek an in-person evaluation if the symptoms worsen or if the condition fails to improve as anticipated.  I provided 5-10 minutes of non-face-to-face time during this enocunter.  Ventura Sellers, MD   I provided 15 minutes of non  face-to-face telephone visit time during this encounter, and > 50% was spent counseling as documented under my assessment & plan.

## 2019-09-28 ENCOUNTER — Telehealth: Payer: Self-pay | Admitting: Internal Medicine

## 2019-09-28 NOTE — Telephone Encounter (Signed)
No 7/13 los 

## 2019-09-29 NOTE — Progress Notes (Signed)
[facility]   [phone] [phone]   Clinic Follow up Note   Patient Care Team: Banks, Shannon R, MD as PCP - General (Family Medicine) [name], Mihai, MD as Consulting Physician (Cardiology) Lavoie, Marie-Lyne, MD as Consulting Physician (Obstetrics and Gynecology) Causey, Lindsey Cornetto, NP as Nurse Practitioner (Hematology and Oncology) [facility] Feng, Yan, MD as Consulting Physician (Hematology) [name], Steven P, MD as Consulting Physician (Gastroenterology) Placke, Dawn, RN (Inactive) as Oncology Nurse Navigator Byerly, Faera, MD as Consulting Physician (General Surgery)  Date of Service:  10/04/2019  CHIEF COMPLAINT: F/u ofcholangiocarcinomaof liver  SUMMARY OF ONCOLOGIC HISTORY: Oncology History  Malignant neoplasm of female breast (HCC)  11/06/2018 Genetic Testing   Negative genetic testing on the [facility] Common Hereditary Cancers panel. A variant of uncertain significance was identified in one of her APC genes, called c.1243G>A (p.Ala415Thr).  The Common Hereditary Cancers Panel offered by [facility] includes sequencing and/or deletion duplication testing of the following 48 genes: APC, ATM, AXIN2, BARD1, BMPR1A, BRCA1, BRCA2, BRIP1, CDH1, CDK4, CDKN2A (p14ARF), CDKN2A (p16INK4a), CHEK2, CTNNA1, DICER1, EPCAM (Deletion/duplication testing only), GREM1 (promoter region deletion/duplication testing only), KIT, MEN1, MLH1, MSH2, MSH3, MSH6, MUTYH, NBN, NF1, NHTL1, PALB2, PDGFRA, PMS2, POLD1, POLE, PTEN, RAD50, RAD51C, RAD51D, RNF43, SDHB, SDHC, SDHD, SMAD4, SMARCA4. STK11, TP53, TSC1, TSC2, and VHL.  The following genes were evaluated for sequence changes only: SDHA and HOXB13 c.251G>A variant only.    Intrahepatic cholangiocarcinoma (HCC)  09/07/2018 Imaging   CT Chest IMPRESSION: 1. New, enhancing mass involving segment 4 of the liver and fundus of gallbladder is concerning for malignancy. This may represent either  metastatic disease from breast cancer or neoplasm primary to the liver or hepatic biliary tree. Further evaluation with contrast enhanced CT of the abdomen and pelvis is recommended. 2. No findings to suggest metastatic disease within the chest. 3.  Aortic Atherosclerosis (ICD10-I70.0). 4. Coronary artery calcifications.   09/13/2018 Pathology Results   Diagnosis Liver, needle/core biopsy - ADENOCARCINOMA. Microscopic Comment Immunohistochemistry for CK7 is positive. CK20, TTF1, CDX-2, GATA-3, PAX 8, Qualitative ER, p63 and CK5/6 are negative. The provided clinical history of remote mammary carcinoma is noted. Based on the morphology and immunophenotype of the adenocarcinoma observed in this specimen, primary cholangiocarcinoma is favored. Clinical and radiologic correlation are  encouraged. Results reported to Lindsey Causey on 09/15/2018. Intradepartmental consultation (Dr. Kashikar).   09/13/2018 Initial Diagnosis   Cholangiocarcinoma (HCC)   09/23/2018 Procedure   Colonoscopy by Dr. [name] 09/23/18  IMPRESSION - Two 3 to 4 mm polyps in the ascending colon, removed with a cold snare. Resected and retrieved. - Five 3 to 5 mm polyps in the transverse colon, removed with a cold snare. Resected and retrieved. - One 5 mm polyp at the splenic flexure, removed with a cold snare. Resected and retrieved. - Three 3 to 5 mm polyps in the sigmoid colon, removed with a cold snare. Resected and retrieved. - The examination was otherwise normal. Upper Endopscy by Dr. [name] 09/23/18  IMPRESSION - Esophagogastric landmarks identified. - 2 cm hiatal hernia. - Normal esophagus otherwise. - A single gastric polyp. Resected and retrieved. - Mild gastritis. Biopsied. - Normal duodenal bulb and second portion of the duodenum.   09/23/2018 Pathology Results   Diagnosis 09/23/18 1. Surgical [P], duodenum - BENIGN SMALL BOWEL MUCOSA. - NO ACTIVE INFLAMMATION OR VILLOUS ATROPHY IDENTIFIED. 2.  Surgical [P], stomach, polyp - HYPERPLASTIC POLYP(S). - THERE IS NO EVIDENCE OF MALIGNANCY. 3. Surgical [P], gastric antrum and gastric   body - CHRONIC INACTIVE GASTRITIS. - THERE IS NO EVIDENCE OF HELICOBACTER-PYLORI, DYSPLASIA, OR MALIGNANCY. - SEE COMMENT. 4. Surgical [P], colon, sigmoid, splenic flexure, transverse and ascending, polyp (9) - TUBULAR ADENOMA(S). - SESSILE SERRATED POLYP WITHOUT CYTOLOGIC DYSPLASIA. - HIGH GRADE DYSPLASIA IS NOT IDENTIFIED. 5. Surgical [P], colon, sigmoid, polyp (2) - HYPERPLASTIC POLYP(S). - THERE IS NO EVIDENCE OF MALIGNANCY.   09/23/2018 Cancer Staging   Staging form: Intrahepatic Bile Duct, AJCC 8th Edition - Clinical stage from 09/23/2018: Stage IB (cT1b, cN0, cM0) - Signed by Feng, Yan, MD on 10/06/2018   09/29/2018 PET scan   PET 09/29/18 IMPRESSION: 1. Hypermetabolic mass in the RIGHT hepatic lobe consistent with biopsy proven adenocarcinoma. No additional liver metastasis. 2. No evidence of local breast cancer recurrence in the RIGHT breast or RIGHT axilla. 3. Mild bilateral hypermetabolic adrenal glands is favored benign hyperplasia. 4. No evidence of additional metastatic disease on skull base to thigh FDG PET scan.   11/06/2018 Genetic Testing   Negative genetic testing on the [employer] Common Hereditary Cancers panel. A variant of uncertain significance was identified in one of her APC genes, called c.1243G>A (p.Ala415Thr).  The Common Hereditary Cancers Panel offered by [facility] includes sequencing and/or deletion duplication testing of the following 48 genes: APC, ATM, AXIN2, BARD1, BMPR1A, BRCA1, BRCA2, BRIP1, CDH1, CDK4, CDKN2A (p14ARF), CDKN2A (p16INK4a), CHEK2, CTNNA1, DICER1, EPCAM (Deletion/duplication testing only), GREM1 (promoter region deletion/duplication testing only), KIT, MEN1, MLH1, MSH2, MSH3, MSH6, MUTYH, NBN, NF1, NHTL1, PALB2, PDGFRA, PMS2, POLD1, POLE, PTEN, RAD50, RAD51C, RAD51D, RNF43, SDHB, SDHC, SDHD, SMAD4, SMARCA4.  STK11, TP53, TSC1, TSC2, and VHL.  The following genes were evaluated for sequence changes only: SDHA and HOXB13 c.251G>A variant only.    11/17/2018 Pathology Results   Diagnosis 11/17/18 1. Soft tissue, biopsy, Diaphragmatic nodules - METASTATIC ADENOCARCINOMA, CONSISTENT WITH PATIENT'S CLINICAL HISTORY OF CHOLANGIOCARCINOMA. SEE NOTE 2. Liver, biopsy, Left - LIVER PARENCHYMA WITH A BENIGN FIBROTIC NODULE - NO EVIDENCE OF MALIGNANCY 3. Stomach, biopsy - BENIGN PAPILLARY MESOTHELIAL HYPERPLASIA - NO EVIDENCE OF MALIGNANCY   11/29/2018 Imaging   CT CAP WO Contrast  IMPRESSION: 1. Dominant liver mass appears grossly stable from 09/29/2018. Additional liver lesions are too small to characterize but were not shown to be hypermetabolic on PET. 2. Mild nodularity of both adrenal glands with associated hypermetabolism on 09/29/2018. Continued attention on follow-up exams is warranted. 3. Small right lower lobe nodules, stable from 09/07/2018. Again, attention on follow-up is recommended. 4. Trace bilateral pleural fluid. 5. Aortic atherosclerosis (ICD10-170.0). Coronary artery calcification. 6. Enlarged pulmonic trunk, indicative of pulmonary arterial hypertension.     11/30/2018 - 06/14/2019 Chemotherapy   First line chemo Oxaliplatin and gemcitabine q2weeks starting 11/30/18. Stopped Oxaliplatin on 05/03/19 due to worsening neuropathy. Added Cisplatin with C13 on 05/17/19 and stopped on C15 06/14/19 due to neuropathy. Reduced to maintenance single agent Gemcitabine on 06/28/19.    01/20/2019 Imaging   CT CAP IMPRESSION: restaging  1. Mild interval increase in size of dominant liver mass involving segment 4 and segment 5. 2. No significant or progressive adrenal nodularity identified to suggest metastatic disease. 3. Unchanged appearance of small right lower lobe and lingular lung nodules, nonspecific.   03/21/2019 PET scan   IMPRESSION: 1. Large right hepatic mass with SUV uptake near  background hepatic activity, dramatic response to therapy, also with decrease in size when compared to the prior study. 2. Signs of prior right mastectomy and axillary dissection as before. 3. Left adrenal activity in no longer above   the level of activity seen in the contralateral, right adrenal gland or in the liver. Attention on follow-up. 4. No new signs of disease.   06/27/2019 PET scan   IMPRESSION: 1. Further decrease in size of a dominant hepatic mass which is non FDG avid. 2. No evidence of metastatic disease. 3. No evidence of left adrenal hypermetabolism or mass. 4. Incidental findings, including: Uterine fibroids. Coronary artery atherosclerosis. Aortic Atherosclerosis (ICD10-I70.0). Pulmonary artery enlargement suggests pulmonary arterial hypertension.   06/28/2019 -  Chemotherapy   Maintenance single agent Gemcitabine q2weeks  starting on 06/28/19 with C16.    09/15/2019 PET scan   IMPRESSION: 1. In the region of regional tumor at the junction of the right and left hepatic lobe, there is continued hypodensity and overall activity slightly less than that of the surrounding normal liver, indicating effective response to therapy. No current worrisome hypermetabolic lesion is identified in the liver or elsewhere. 2. Chronic asymmetric edema in the subcutaneous tissues of the right upper extremity, nonspecific. The patient has had a prior right mastectomy and right axillary dissection. 3. Other imaging findings of potential clinical significance: Aortic Atherosclerosis (ICD10-I70.0). Coronary atherosclerosis. Mild cardiomegaly. Small right and trace left pleural effusions. Subcutaneous and mild mesenteric edema. Suspected uterine fibroids.      CURRENT THERAPY:  Maintenance single agent Gemcitabine q2weeks starting on 06/28/19 with C16.  INTERVAL HISTORY:  Donna May is here for a follow up and treatment. She presents to the clinic alone. She notes she has not seen  her Gyn in 2 years. She notes since her right mastectomy, her left breast is much smaller overall.  She notes her neuropathy is stable. She notes she has not been seen by OT. She will see Dr Edward Harrington tomorrow for neuropathy treatment who is a chiropractor. She is not doing much herself at home. She notes she has not taken Gabapentin or Lyrica. She notes she has tried Cymbalta as well. She notes she is out of Raloxifene that her Gyn was managing. She notes she has been on for some time until she ran out. She notes she did knick her chin shaving this morning and has a bandage over it.    REVIEW OF SYSTEMS:   Constitutional: Denies fevers, chills or abnormal weight loss Eyes: Denies blurriness of vision Ears, nose, mouth, throat, and face: Denies mucositis or sore throat Respiratory: Denies cough, dyspnea or wheezes Cardiovascular: Denies palpitation, chest discomfort or lower extremity swelling Gastrointestinal:  Denies nausea, heartburn or change in bowel habits Skin: Denies abnormal skin rashes Lymphatics: Denies new lymphadenopathy or easy bruising Neurological: (+) Stable neuropathy  Behavioral/Psych: Mood is stable, no new changes  All other systems were reviewed with the patient and are negative.  MEDICAL HISTORY:  Past Medical History:  Diagnosis Date  . Anemia   . Anxiety   . Arthritis   . Breast cancer (HCC) 1992  . Cataract    Bilateral eyes - surgery to remove  . Chronic kidney disease    CKD stage 3  . Complication of anesthesia    "something they use make me itch for a couple of days."  . Depression   . Diabetes mellitus    type 2  . Duodenitis 01/18/2002  . Fainting spell   . Family history of breast cancer   . Family history of prostate cancer   . GERD (gastroesophageal reflux disease)   . Heart murmur    never has caused any problems  . Hiatal hernia 08/08/2008,   01/18/2002  . History of pneumonia    x 2  . Hyperlipidemia   . Hypertension   . Liver  cancer (HCC) 08/2018  . Lymphedema 2017   Right arm  . Pneumonia    x 2  . UTI (lower urinary tract infection)     SURGICAL HISTORY: Past Surgical History:  Procedure Laterality Date  . AXILLARY SURGERY     cyst removal, right  . COLONOSCOPY  09/23/2018   Dr. [name] - polyps  . EYE SURGERY Bilateral    cataracts to remove  . LAPAROSCOPY N/A 11/17/2018   Procedure: LAPAROSCOPY DIAGNOSTIC, INTRAOPERATIVE ULTRASOUND, PERITONEAL BIOPSIES;  Surgeon: Byerly, Faera, MD;  Location: [facility] OR;  Service: General;  Laterality: N/A;  GENERAL AND EPIDURAL  . LIVER BIOPSY  08/2018   Dr. Maganot  . MASTECTOMY  1992   right, with flap  . NM MYOCAR PERF WALL MOTION  06/11/2009   Protocol:Bruce, post stress EF58%, EKG negative for ischemia, low risk  . PORTACATH PLACEMENT N/A 12/02/2018   Procedure: INSERTION PORT-A-CATH;  Surgeon: Byerly, Faera, MD;  Location: [facility] OR;  Service: General;  Laterality: N/A;  . RECONSTRUCTION BREAST W/ TRAM FLAP Right   . TONSILLECTOMY    . TRANSTHORACIC ECHOCARDIOGRAM  12/24/2009   LVEF =>55%, normal study  . UPPER GI ENDOSCOPY      I have reviewed the social history and family history with the patient and they are unchanged from previous note.  ALLERGIES:  has No Known Allergies.  MEDICATIONS:  Current Outpatient Medications  Medication Sig Dispense Refill  . amLODipine (NORVASC) 10 MG tablet TAKE 1 TABLET BY MOUTH EVERY DAY 90 tablet 3  . aspirin 81 MG tablet Take 81 mg by mouth at bedtime.     . Calcium Carbonate-Vitamin D 600-200 MG-UNIT TABS Take 1 tablet by mouth daily.    . CALCIUM-MAGNESIUM-VITAMIN D PO Take 1 tablet by mouth once a week.     . Cholecalciferol (VITAMIN D3) 2000 UNITS TABS Take 1 tablet by mouth once a week.     . Echinacea-Goldenseal (ECHINACEA COMB/GOLDEN SEAL) CAPS Take by mouth.    . glucose blood test strip 1 each by Other route 2 (two) times daily. Use Onetouch verio test strips as instructed to check blood sugar twice daily. 100  each 2  . hydrochlorothiazide (HYDRODIURIL) 25 MG tablet Take 1 tablet (25 mg total) by mouth daily. 90 tablet 3  . HYDROcodone-acetaminophen (NORCO/VICODIN) 5-325 MG tablet Take 1 tablet by mouth every 6 (six) hours as needed for moderate pain. 10 tablet 0  . ketoconazole (NIZORAL) 2 % cream Apply 1 fingertip amount to each foot daily. 30 g 0  . lansoprazole (PREVACID) 15 MG capsule Take 1 capsule (15 mg total) by mouth daily. 90 capsule 1  . lidocaine-prilocaine (EMLA) cream Apply to affected area once 90 g 1  . loratadine (CLARITIN) 10 MG tablet Take 10 mg by mouth daily as needed for allergies.    . metFORMIN (GLUCOPHAGE-XR) 500 MG 24 hr tablet Take 1 tablet (500 mg total) by mouth 2 (two) times daily before a meal. 180 tablet 1  . metoprolol succinate (TOPROL-XL) 100 MG 24 hr tablet Take 1 tablet (100 mg total) by mouth daily. Take with or immediately following a meal. 90 tablet 1  . Omega 3 1000 MG CAPS Take 2,000 mg by mouth daily.     . ondansetron (ZOFRAN) 8 MG tablet Take 1 tablet (8 mg total) by mouth every 8 (eight) hours as   needed for nausea or vomiting. 30 tablet 0  . prednisoLONE acetate (PRED FORTE) 1 % ophthalmic suspension     . Prenatal Vit-Fe Fumarate-FA (MULTIVITAMIN-PRENATAL) 27-0.8 MG TABS tablet Take 1 tablet by mouth daily at 12 noon.    . prochlorperazine (COMPAZINE) 10 MG tablet Take 1 tablet (10 mg total) by mouth every 6 (six) hours as needed for nausea or vomiting. 30 tablet 0  . raloxifene (EVISTA) 60 MG tablet TAKE 1 TABLET DAILY 90 tablet 0  . rosuvastatin (CRESTOR) 20 MG tablet Take 1 tablet (20 mg total) by mouth daily. 90 tablet 1  . triamcinolone cream (KENALOG) 0.1 % Apply 1 application topically 2 (two) times daily. 30 g 0  . valsartan (DIOVAN) 320 MG tablet Take 1 tablet (320 mg total) by mouth daily. 90 tablet 3  . DULoxetine (CYMBALTA) 30 MG capsule Take 1 capsule (30 mg total) by mouth daily. (Patient not taking: Reported on 10/04/2019) 90 capsule 1  .  sitaGLIPtin (JANUVIA) 100 MG tablet Take 1 tablet (100 mg total) by mouth daily. 90 tablet 4   Current Facility-Administered Medications  Medication Dose Route Frequency Provider Last Rate Last Admin  . triamcinolone acetonide (KENALOG) 10 MG/ML injection 10 mg  10 mg Other Once Sikora, Richard, DPM        PHYSICAL EXAMINATION: ECOG PERFORMANCE STATUS: 2 - Symptomatic, <50% confined to bed  Vitals:   10/04/19 0918  BP: (!) 188/91  Pulse: 74  Resp: 16  Temp: 98.9 F (37.2 C)  SpO2: 97%   Filed Weights   10/04/19 0918  Weight: 129 lb 6.4 oz (58.7 kg)    GENERAL:alert, no distress and comfortable SKIN: skin color, texture, turgor are normal, no rashes or significant lesions EYES: normal, Conjunctiva are pink and non-injected, sclera clear  NECK: supple, thyroid normal size, non-tender, without nodularity LYMPH:  no palpable lymphadenopathy in the cervical, axillary  LUNGS: clear to auscultation and percussion with normal breathing effort HEART: regular rate & rhythm and no murmurs and no lower extremity edema ABDOMEN:abdomen soft, non-tender and normal bowel sounds Musculoskeletal:no cyanosis of digits and no clubbing  NEURO: alert & oriented x 3 with fluent speech, no focal motor/sensory deficits BREAST: S/p right mastectomy: Surgical incision healed well. (+) Right arm lymphedema. No palpable mass, nodules or adenopathy bilaterally. Breast exam benign.   LABORATORY DATA:  I have reviewed the data as listed CBC Latest Ref Rng & Units 10/04/2019 09/20/2019 09/06/2019  WBC 4.0 - 10.5 K/uL 7.0 8.1 6.1  Hemoglobin 12.0 - 15.0 g/dL 9.3(L) 7.5(L) 8.0(L)  Hematocrit 36 - 46 % 29.2(L) 23.7(L) 25.7(L)  Platelets 150 - 400 K/uL 207 330 177     CMP Latest Ref Rng & Units 10/04/2019 09/20/2019 09/06/2019  Glucose 70 - 99 mg/dL 106(H) 93 168(H)  BUN 8 - 23 mg/dL 25(H) 18 16  Creatinine 0.44 - 1.00 mg/dL 1.49(H) 1.44(H) 1.24(H)  Sodium 135 - 145 mmol/L 141 141 142  Potassium 3.5 - 5.1  mmol/L 3.8 4.0 3.6  Chloride 98 - 111 mmol/L 107 108 110  CO2 22 - 32 mmol/L 27 22 23  Calcium 8.9 - 10.3 mg/dL 9.2 8.9 8.4(L)  Total Protein 6.5 - 8.1 g/dL 7.1 7.0 6.6  Total Bilirubin 0.3 - 1.2 mg/dL 0.6 0.6 0.6  Alkaline Phos 38 - 126 U/L 65 53 53  AST 15 - 41 U/L 27 27 22  ALT 0 - 44 U/L 8 7 9      RADIOGRAPHIC STUDIES: I have personally   reviewed the radiological images as listed and agreed with the findings in the report. No results found.   ASSESSMENT & PLAN:  Donna May is a 79 y.o. female with    1.Intrahepatic cholangiocarcinoma, cT1N0M1,with peritoneal metastasis, MSS, IDH1 mutation (+) -She was diagnosed in 08/2018. Her biopsy of her liver mass shows adenocarcinoma,most consistent withcholangiocarcinoma.Her EGD/Colonoscopy from 7/10/20was negativeand 09/29/18 PET scanshowedno evidence of distant metastasis. -She was brought to OR on 9/3,unfortunately she was found to have peritoneal metastasis to right diaphragm and surgery was aborted. -Given her metastatic cancer, Istarted her onsystemicchemo withFirst line chemoOxaliplatinand gemcitabine q2weeksto control her disease.She started on 11/30/18.Stopped Oxaliplatin on 05/03/19 due to worsening neuropathy.AddedCisplatin with C13 on 05/17/19.Due to progressing neuropathy cisplatin was stopped on 06/14/19 and she proceeding with single agent Gemcitabineevery 2 weeksstarting 06/28/19 with C16.Dose was reduced to 630m/m2 since 09/20/19 due to anemia.  -She is overall stable. Labs reviewed, Hg 9.3, BG 106, Cr 1.49, albumin 3.2, Mag normal. Overall adequate to Gemcitabine today. She is agreeable to continue treatment. She understands her chemo is palliative to prolong her life  -Continue Gemcitabine every 2 weeks.  -F/u in 2 weeks.    2. Anemia -Secondary to chemo -Shepreviouslyrequiredblood transfusionon1/13/21 and again on 08/16/19. Will continue prenatal vitamin.Her MMA was normal (08/09/19) -Labs show  Hg improved to 9.3 today (10/04/19). No blood transfusion this week.   3. Neuropathy, G3 -secondary to Oxaliplatin, dose was reduced and eventually dc'd after cycle 11 -Started cisplatin on 05/17/19, neuropathy worsened in her feet with unsteady gait and functional limitations. Cisplatin was dose reducedthen eventually stopped on 06/14/19. -Due to low tolerance and not much help, she stopped Gabapentin and Lyrica. Given the impact on her sleep I recommend she try Gabapentin 1065mat night and Cymbalta during the day. She is willing to try.  -She has completed PT, but has not been seen by OT yet. She remains to have decreased function overall.  -She was previously seen by Dr VaMickeal SkinnerShe plans to be seen by Chiropractor Dr EdPage SpiroIf she proceeds with neurology second opinion, I have previously referred her to Dr. YeEvelena Leyden -For any medical treatment Dr HaJannifer Rodneyay give I recommend she let usKoreanow to check for any interactions.   4. H/o right breast cancer, Geneticsnegative -s/p right mastectomy in 1992, per patient did not require adjuvant therapy  -followed by Dr. MaJana HakimHer genetic testing wasnegativefor pathogeneticmutations. She did have VUS of gene APC. -She was seen to have right arm lymphedema on her 09/15/19 PET scan.  -She notes she had remained on Raloxifene for some time until she ran out recently. I discussed this is fine for her to stop now.  -Her physical exam today shows mild right arm lymphedema (10/04/19).   5.Comorbidities:DM,HTN,hiatal hernia, GERD, renal artery stenosis -f/u with PCP -She did not tolerate Glucerna, she will continue low sugar protein shakes. -She is on amlodipine and HTCZ, metoprolol and losartan. HTN uncontrolled. I encouraged her to f/u with PCP.  -Her HTCZ was held due to elevated Cr. Will restart today given worsened BP at 203/91 (09/20/19). I advised her to have her PCP adjust her HTN medications moving forward.   6. Family  support, Goal of Care Discussion  -She has a son and daughterwho are often busy but help as needed.  -She notes they are aware of her condition. Iencouraged her to keep her children updated and ask them for more home support. -The patient understands the goal of care is palliative. -she  is full code now  7. CKD stage III -I strongly encouraged her to drink more water at least 40 ounces daily to maintain kidney function. -Was improving but has increased recently.Continue to monitor.Stable Cr at 1.2-15. -NP Lacie previously stopped her HCTZ on 08/23/19. Due to now elevated BP I restarted her on 09/20/19.  PLAN: -Labs reviewed and adequate to proceed with Gemcitabine today at same dose  -she will restart low dose gabapentin 100 mg at night, and Cymbalta 20 mg daily in the morning -She plans to see chiropractics for her neuropathy this week  -I will follow-up with her occupational therapy referral -Lab, flush, f/u and Gemcitabine in 2, 4 weeks    No problem-specific Assessment & Plan notes found for this encounter.   No orders of the defined types were placed in this encounter.  All questions were answered. The patient knows to call the clinic with any problems, questions or concerns. No barriers to learning was detected. The total time spent in the appointment was 30 minutes.     Yan Feng, MD 10/04/2019   I, Amoya Bennett, am acting as scribe for Yan Feng, MD.   I have reviewed the above documentation for accuracy and completeness, and I agree with the above.       

## 2019-10-04 ENCOUNTER — Inpatient Hospital Stay: Payer: Medicare Other

## 2019-10-04 ENCOUNTER — Inpatient Hospital Stay: Payer: Medicare Other | Admitting: Nutrition

## 2019-10-04 ENCOUNTER — Inpatient Hospital Stay (HOSPITAL_BASED_OUTPATIENT_CLINIC_OR_DEPARTMENT_OTHER): Payer: Medicare Other | Admitting: Hematology

## 2019-10-04 ENCOUNTER — Encounter: Payer: Self-pay | Admitting: Hematology

## 2019-10-04 ENCOUNTER — Other Ambulatory Visit: Payer: Self-pay

## 2019-10-04 VITALS — BP 188/91 | HR 74 | Temp 98.9°F | Resp 16 | Wt 129.4 lb

## 2019-10-04 VITALS — BP 180/83

## 2019-10-04 DIAGNOSIS — Z5111 Encounter for antineoplastic chemotherapy: Secondary | ICD-10-CM | POA: Diagnosis not present

## 2019-10-04 DIAGNOSIS — C221 Intrahepatic bile duct carcinoma: Secondary | ICD-10-CM

## 2019-10-04 DIAGNOSIS — Z95828 Presence of other vascular implants and grafts: Secondary | ICD-10-CM

## 2019-10-04 DIAGNOSIS — C786 Secondary malignant neoplasm of retroperitoneum and peritoneum: Secondary | ICD-10-CM | POA: Diagnosis not present

## 2019-10-04 LAB — CMP (CANCER CENTER ONLY)
ALT: 8 U/L (ref 0–44)
AST: 27 U/L (ref 15–41)
Albumin: 3.2 g/dL — ABNORMAL LOW (ref 3.5–5.0)
Alkaline Phosphatase: 65 U/L (ref 38–126)
Anion gap: 7 (ref 5–15)
BUN: 25 mg/dL — ABNORMAL HIGH (ref 8–23)
CO2: 27 mmol/L (ref 22–32)
Calcium: 9.2 mg/dL (ref 8.9–10.3)
Chloride: 107 mmol/L (ref 98–111)
Creatinine: 1.49 mg/dL — ABNORMAL HIGH (ref 0.44–1.00)
GFR, Est AFR Am: 38 mL/min — ABNORMAL LOW (ref >60)
GFR, Estimated: 33 mL/min — ABNORMAL LOW (ref >60)
Glucose, Bld: 106 mg/dL — ABNORMAL HIGH (ref 70–99)
Potassium: 3.8 mmol/L (ref 3.5–5.1)
Sodium: 141 mmol/L (ref 135–145)
Total Bilirubin: 0.6 mg/dL (ref 0.3–1.2)
Total Protein: 7.1 g/dL (ref 6.5–8.1)

## 2019-10-04 LAB — CBC WITH DIFFERENTIAL (CANCER CENTER ONLY)
Abs Immature Granulocytes: 0.03 10*3/uL (ref 0.00–0.07)
Basophils Absolute: 0 10*3/uL (ref 0.0–0.1)
Basophils Relative: 0 %
Eosinophils Absolute: 0.3 10*3/uL (ref 0.0–0.5)
Eosinophils Relative: 4 %
HCT: 29.2 % — ABNORMAL LOW (ref 36.0–46.0)
Hemoglobin: 9.3 g/dL — ABNORMAL LOW (ref 12.0–15.0)
Immature Granulocytes: 0 %
Lymphocytes Relative: 14 %
Lymphs Abs: 1 10*3/uL (ref 0.7–4.0)
MCH: 31.1 pg (ref 26.0–34.0)
MCHC: 31.8 g/dL (ref 30.0–36.0)
MCV: 97.7 fL (ref 80.0–100.0)
Monocytes Absolute: 1.2 10*3/uL — ABNORMAL HIGH (ref 0.1–1.0)
Monocytes Relative: 17 %
Neutro Abs: 4.5 10*3/uL (ref 1.7–7.7)
Neutrophils Relative %: 65 %
Platelet Count: 207 10*3/uL (ref 150–400)
RBC: 2.99 MIL/uL — ABNORMAL LOW (ref 3.87–5.11)
RDW: 17.2 % — ABNORMAL HIGH (ref 11.5–15.5)
WBC Count: 7 10*3/uL (ref 4.0–10.5)
nRBC: 0 % (ref 0.0–0.2)

## 2019-10-04 LAB — MAGNESIUM: Magnesium: 2.1 mg/dL (ref 1.7–2.4)

## 2019-10-04 MED ORDER — PROCHLORPERAZINE MALEATE 10 MG PO TABS
ORAL_TABLET | ORAL | Status: AC
  Filled 2019-10-04: qty 1

## 2019-10-04 MED ORDER — SODIUM CHLORIDE 0.9 % IV SOLN
600.0000 mg/m2 | Freq: Once | INTRAVENOUS | Status: AC
  Administered 2019-10-04: 988 mg via INTRAVENOUS
  Filled 2019-10-04: qty 25.98

## 2019-10-04 MED ORDER — SODIUM CHLORIDE 0.9% FLUSH
10.0000 mL | Freq: Once | INTRAVENOUS | Status: AC
  Administered 2019-10-04: 10 mL
  Filled 2019-10-04: qty 10

## 2019-10-04 MED ORDER — SODIUM CHLORIDE 0.9% FLUSH
10.0000 mL | INTRAVENOUS | Status: DC | PRN
  Administered 2019-10-04: 10 mL
  Filled 2019-10-04: qty 10

## 2019-10-04 MED ORDER — HEPARIN SOD (PORK) LOCK FLUSH 100 UNIT/ML IV SOLN
500.0000 [IU] | Freq: Once | INTRAVENOUS | Status: AC | PRN
  Administered 2019-10-04: 500 [IU]
  Filled 2019-10-04: qty 5

## 2019-10-04 MED ORDER — PROCHLORPERAZINE MALEATE 10 MG PO TABS
10.0000 mg | ORAL_TABLET | Freq: Once | ORAL | Status: AC
  Administered 2019-10-04: 10 mg via ORAL

## 2019-10-04 MED ORDER — SODIUM CHLORIDE 0.9 % IV SOLN
Freq: Once | INTRAVENOUS | Status: AC
  Filled 2019-10-04: qty 250

## 2019-10-04 NOTE — Progress Notes (Signed)
Nutrition follow-up completed with patient during infusion for metastatic cholangiocarcinoma. Weight was documented as 129.4 pounds July 21.  This is increased from 125.6 pounds July 7. Labs were reviewed. Reports she continues to drink 1 boost daily. Constipation is problematic however she does not want to take a laxative and then come to an appointment. Patient reports she eats what she wants to; whatever she can taste. She denies questions or concerns or needs at this time.  Nutrition diagnosis: Inadequate oral intake is stable.  Intervention: Provided support and encouragement for patient to continue strategies for adequate calorie and protein intake. Encouraged her to continue oral nutrition supplements as needed. Stressed importance of weight maintenance. Encouraged bowel regimen.  Recommended increased fluid intake.  Monitoring, evaluation, goals: Patient will tolerate adequate calories and protein to minimize further weight loss.  Next visit: No follow-up has been scheduled.  Please refer back to RD if nutrition needs are identified.  **Disclaimer: This note was dictated with voice recognition software. Similar sounding words can inadvertently be transcribed and this note may contain transcription errors which may not have been corrected upon publication of note.**

## 2019-10-04 NOTE — Patient Instructions (Signed)
[facility] °Discharge Instructions for Patients Receiving Chemotherapy ° °Today you received the following chemotherapy agents Gemzar ° °To help prevent nausea and vomiting after your treatment, we encourage you to take your nausea medication as directed. °  °If you develop nausea and vomiting that is not controlled by your nausea medication, call the clinic.  ° °BELOW ARE SYMPTOMS THAT SHOULD BE REPORTED IMMEDIATELY: °· *FEVER GREATER THAN 100.5 F °· *CHILLS WITH OR WITHOUT FEVER °· NAUSEA AND VOMITING THAT IS NOT CONTROLLED WITH YOUR NAUSEA MEDICATION °· *UNUSUAL SHORTNESS OF BREATH °· *UNUSUAL BRUISING OR BLEEDING °· TENDERNESS IN MOUTH AND THROAT WITH OR WITHOUT PRESENCE OF ULCERS °· *URINARY PROBLEMS °· *BOWEL PROBLEMS °· UNUSUAL RASH °Items with * indicate a potential emergency and should be followed up as soon as possible. ° °Feel free to call the clinic should you have any questions or concerns. The clinic phone number is [phone]. ° °Please show the CHEMO ALERT CARD at check-in to the Emergency Department and triage nurse. ° ° °

## 2019-10-04 NOTE — Progress Notes (Signed)
Per MD okay to treat with BP 190/86.  Pt states she just took her home dose of blood pressure medication will re check in 1 hour.   1121- BP 180/83- asymptomatic. Per MD pt to monitor B/P at home and alert PCP of any abnoraml readings for possible medication adjustment.  Pt verbalized understanding and discharged home.

## 2019-10-06 ENCOUNTER — Telehealth: Payer: Self-pay

## 2019-10-06 NOTE — Telephone Encounter (Signed)
Called over to [facility] to f/u on OT referral that was forward over to them. Spoke with Romie Minus and she confirmed that they received referral and they have been behind and trying to catch up but will make a note for herself to call Ms. Fasnacht on Monday to schedule OT appt.   Dr. Burr Medico Notified.

## 2019-10-11 NOTE — Progress Notes (Signed)
[name]   Telephone:([phone] Fax:([phone]) [phone]   Clinic Follow up Note   Patient Care Team: [name], MD as PCP - General (Family Medicine) [name], [name], MD as Consulting Physician (Cardiology) [name], MD as Consulting Physician (Obstetrics and Gynecology) [name], [name], NP as Nurse Practitioner (Hematology and Oncology) [facility] [name], MD as Consulting Physician (Hematology) [name], [name], MD as Consulting Physician (Gastroenterology) Virgina Evener, Dawn, RN (Inactive) as Oncology Nurse Navigator [name], MD as Consulting Physician (General Surgery)  Date of Service:  10/18/2019  CHIEF COMPLAINT: F/u ofcholangiocarcinomaof liver  SUMMARY OF ONCOLOGIC HISTORY: Oncology History  Malignant neoplasm of female breast ([place])  11/06/2018 Genetic Testing   Negative genetic testing on the [facility] Common Hereditary Cancers panel. A variant of uncertain significance was identified in one of her APC genes, called c.1243G>A (p.Ala415Thr).  The Common Hereditary Cancers Panel offered by [facility] includes sequencing and/or deletion duplication testing of the following 48 genes: APC, ATM, AXIN2, BARD1, BMPR1A, BRCA1, BRCA2, BRIP1, CDH1, CDK4, CDKN2A (p14ARF), CDKN2A (p16INK4a), CHEK2, CTNNA1, DICER1, EPCAM (Deletion/duplication testing only), GREM1 (promoter region deletion/duplication testing only), KIT, MEN1, MLH1, MSH2, MSH3, MSH6, MUTYH, NBN, NF1, NHTL1, PALB2, PDGFRA, PMS2, POLD1, POLE, PTEN, RAD50, RAD51C, RAD51D, RNF43, SDHB, SDHC, SDHD, SMAD4, SMARCA4. STK11, TP53, TSC1, TSC2, and VHL.  The following genes were evaluated for sequence changes only: SDHA and HOXB13 c.251G>A variant only.    Intrahepatic cholangiocarcinoma ([place])  09/07/2018 Imaging   CT Chest IMPRESSION: 1. New, enhancing mass involving segment 4 of the liver and fundus of gallbladder is concerning for malignancy. This may represent either  metastatic disease from breast cancer or neoplasm primary to the liver or hepatic biliary tree. Further evaluation with contrast enhanced CT of the abdomen and pelvis is recommended. 2. No findings to suggest metastatic disease within the chest. 3.  Aortic Atherosclerosis (ICD10-I70.0). 4. Coronary artery calcifications.   09/13/2018 Pathology Results   Diagnosis Liver, needle/core biopsy - ADENOCARCINOMA. Microscopic Comment Immunohistochemistry for CK7 is positive. CK20, TTF1, CDX-2, GATA-3, PAX 8, Qualitative ER, p63 and CK5/6 are negative. The provided clinical history of remote mammary carcinoma is noted. Based on the morphology and immunophenotype of the adenocarcinoma observed in this specimen, primary cholangiocarcinoma is favored. Clinical and radiologic correlation are  encouraged. Results reported to [employer] on 09/15/2018. Intradepartmental consultation (Dr. Vic Ripper).   09/13/2018 Initial Diagnosis   Cholangiocarcinoma ([place])   09/23/2018 Procedure   Colonoscopy by Dr. Havery Moros 09/23/18  IMPRESSION - Two 3 to 4 mm polyps in the ascending colon, removed with a cold snare. Resected and retrieved. - Five 3 to 5 mm polyps in the transverse colon, removed with a cold snare. Resected and retrieved. - One 5 mm polyp at the splenic flexure, removed with a cold snare. Resected and retrieved. - Three 3 to 5 mm polyps in the sigmoid colon, removed with a cold snare. Resected and retrieved. - The examination was otherwise normal. Upper Endopscy by Dr. Havery Moros 09/23/18  IMPRESSION - Esophagogastric landmarks identified. - 2 cm hiatal hernia. - Normal esophagus otherwise. - A single gastric polyp. Resected and retrieved. - Mild gastritis. Biopsied. - Normal duodenal bulb and second portion of the duodenum.   09/23/2018 Pathology Results   Diagnosis 09/23/18 1. Surgical [P], duodenum - BENIGN SMALL BOWEL MUCOSA. - NO ACTIVE INFLAMMATION OR VILLOUS ATROPHY IDENTIFIED. 2.  Surgical [P], stomach, polyp - HYPERPLASTIC POLYP(S). - THERE IS NO EVIDENCE OF MALIGNANCY. 3. Surgical [P], gastric antrum and gastric  body - CHRONIC INACTIVE GASTRITIS. - THERE IS NO EVIDENCE OF HELICOBACTER-PYLORI, DYSPLASIA, OR MALIGNANCY. - SEE COMMENT. 4. Surgical [P], colon, sigmoid, splenic flexure, transverse and ascending, polyp (9) - TUBULAR ADENOMA(S). - SESSILE SERRATED POLYP WITHOUT CYTOLOGIC DYSPLASIA. - HIGH GRADE DYSPLASIA IS NOT IDENTIFIED. 5. Surgical [P], colon, sigmoid, polyp (2) - HYPERPLASTIC POLYP(S). - THERE IS NO EVIDENCE OF MALIGNANCY.   09/23/2018 Cancer Staging   Staging form: Intrahepatic Bile Duct, AJCC 8th Edition - Clinical stage from 09/23/2018: Stage IB (cT1b, cN0, cM0) - Signed by Waddell Iten, MD on 10/06/2018   09/29/2018 PET scan   PET 09/29/18 IMPRESSION: 1. Hypermetabolic mass in the RIGHT hepatic lobe consistent with biopsy proven adenocarcinoma. No additional liver metastasis. 2. No evidence of local breast cancer recurrence in the RIGHT breast or RIGHT axilla. 3. Mild bilateral hypermetabolic adrenal glands is favored benign hyperplasia. 4. No evidence of additional metastatic disease on skull base to thigh FDG PET scan.   11/06/2018 Genetic Testing   Negative genetic testing on the [facility] Common Hereditary Cancers panel. A variant of uncertain significance was identified in one of her APC genes, called c.1243G>A (p.Ala415Thr).  The Common Hereditary Cancers Panel offered by [facility] includes sequencing and/or deletion duplication testing of the following 48 genes: APC, ATM, AXIN2, BARD1, BMPR1A, BRCA1, BRCA2, BRIP1, CDH1, CDK4, CDKN2A (p14ARF), CDKN2A (p16INK4a), CHEK2, CTNNA1, DICER1, EPCAM (Deletion/duplication testing only), GREM1 (promoter region deletion/duplication testing only), KIT, MEN1, MLH1, MSH2, MSH3, MSH6, MUTYH, NBN, NF1, NHTL1, PALB2, PDGFRA, PMS2, POLD1, POLE, PTEN, RAD50, RAD51C, RAD51D, RNF43, SDHB, SDHC, SDHD, SMAD4, SMARCA4.  STK11, TP53, TSC1, TSC2, and VHL.  The following genes were evaluated for sequence changes only: SDHA and HOXB13 c.251G>A variant only.    11/17/2018 Pathology Results   Diagnosis 11/17/18 1. Soft tissue, biopsy, Diaphragmatic nodules - METASTATIC ADENOCARCINOMA, CONSISTENT WITH PATIENT'S CLINICAL HISTORY OF CHOLANGIOCARCINOMA. SEE NOTE 2. Liver, biopsy, Left - LIVER PARENCHYMA WITH A BENIGN FIBROTIC NODULE - NO EVIDENCE OF MALIGNANCY 3. Stomach, biopsy - BENIGN PAPILLARY MESOTHELIAL HYPERPLASIA - NO EVIDENCE OF MALIGNANCY   11/29/2018 Imaging   CT CAP WO Contrast  IMPRESSION: 1. Dominant liver mass appears grossly stable from 09/29/2018. Additional liver lesions are too small to characterize but were not shown to be hypermetabolic on PET. 2. Mild nodularity of both adrenal glands with associated hypermetabolism on 09/29/2018. Continued attention on follow-up exams is warranted. 3. Small right lower lobe nodules, stable from 09/07/2018. Again, attention on follow-up is recommended. 4. Trace bilateral pleural fluid. 5. Aortic atherosclerosis (ICD10-170.0). Coronary artery calcification. 6. Enlarged pulmonic trunk, indicative of pulmonary arterial hypertension.     11/30/2018 - 06/14/2019 Chemotherapy   First line chemo Oxaliplatin and gemcitabine q2weeks starting 11/30/18. Stopped Oxaliplatin on 05/03/19 due to worsening neuropathy. Added Cisplatin with C13 on 05/17/19 and stopped on C15 06/14/19 due to neuropathy. Reduced to maintenance single agent Gemcitabine on 06/28/19.    01/20/2019 Imaging   CT CAP IMPRESSION: restaging  1. Mild interval increase in size of dominant liver mass involving segment 4 and segment 5. 2. No significant or progressive adrenal nodularity identified to suggest metastatic disease. 3. Unchanged appearance of small right lower lobe and lingular lung nodules, nonspecific.   03/21/2019 PET scan   IMPRESSION: 1. Large right hepatic mass with SUV uptake near  background hepatic activity, dramatic response to therapy, also with decrease in size when compared to the prior study. 2. Signs of prior right mastectomy and axillary dissection as before. 3. Left adrenal activity in no longer above   the level of activity seen in the contralateral, right adrenal gland or in the liver. Attention on follow-up. 4. No new signs of disease.   06/27/2019 PET scan   IMPRESSION: 1. Further decrease in size of a dominant hepatic mass which is non FDG avid. 2. No evidence of metastatic disease. 3. No evidence of left adrenal hypermetabolism or mass. 4. Incidental findings, including: Uterine fibroids. Coronary artery atherosclerosis. Aortic Atherosclerosis (ICD10-I70.0). Pulmonary artery enlargement suggests pulmonary arterial hypertension.   06/28/2019 -  Chemotherapy   Maintenance single agent Gemcitabine q2weeks  starting on 06/28/19 with C16.    09/15/2019 PET scan   IMPRESSION: 1. In the region of regional tumor at the junction of the right and left hepatic lobe, there is continued hypodensity and overall activity slightly less than that of the surrounding normal liver, indicating effective response to therapy. No current worrisome hypermetabolic lesion is identified in the liver or elsewhere. 2. Chronic asymmetric edema in the subcutaneous tissues of the right upper extremity, nonspecific. The patient has had a prior right mastectomy and right axillary dissection. 3. Other imaging findings of potential clinical significance: Aortic Atherosclerosis (ICD10-I70.0). Coronary atherosclerosis. Mild cardiomegaly. Small right and trace left pleural effusions. Subcutaneous and mild mesenteric edema. Suspected uterine fibroids.      CURRENT THERAPY:  Maintenance single agent Gemcitabine q2weeks starting on 06/28/19 with C16.    INTERVAL HISTORY:  Donna May is here for a follow up and treatment. She presents to the clinic alone. She notes she was seen  by chiropractor for treatment which is helping her neuropathy. She notes the light and vibration therapy has helped so far. She notes she was called by OT in [place] and they were to refer her to a OT in [place]. She will see her treatment with chiropractor goes, before proceeding with OT or referral to Dr Evelena Leyden. I reviewed her medication list with her.     REVIEW OF SYSTEMS:   Constitutional: Denies fevers, chills or abnormal weight loss Eyes: Denies blurriness of vision Ears, nose, mouth, throat, and face: Denies mucositis or sore throat Respiratory: Denies cough, dyspnea or wheezes Cardiovascular: Denies palpitation, chest discomfort or lower extremity swelling Gastrointestinal:  Denies nausea, heartburn or change in bowel habits Skin: Denies abnormal skin rashes Lymphatics: Denies new lymphadenopathy or easy bruising Neurological:Denies numbness, tingling or new weaknesses Behavioral/Psych: Mood is stable, no new changes  All other systems were reviewed with the patient and are negative.  MEDICAL HISTORY:  Past Medical History:  Diagnosis Date  . Anemia   . Anxiety   . Arthritis   . Breast cancer ([place]) 1992  . Cataract    Bilateral eyes - surgery to remove  . Chronic kidney disease    CKD stage 3  . Complication of anesthesia    "something they use make me itch for a couple of days."  . Depression   . Diabetes mellitus    type 2  . Duodenitis 01/18/2002  . Fainting spell   . Family history of breast cancer   . Family history of prostate cancer   . GERD (gastroesophageal reflux disease)   . Heart murmur    never has caused any problems  . Hiatal hernia 08/08/2008, 01/18/2002  . History of pneumonia    x 2  . Hyperlipidemia   . Hypertension   . Liver cancer ([place]) 08/2018  . Lymphedema 2017   Right arm  . Pneumonia    x 2  . UTI (lower urinary tract  infection)     SURGICAL HISTORY: Past Surgical History:  Procedure Laterality Date  . AXILLARY SURGERY      cyst removal, right  . COLONOSCOPY  09/23/2018   Dr. Havery Moros - polyps  . EYE SURGERY Bilateral    cataracts to remove  . LAPAROSCOPY N/A 11/17/2018   Procedure: LAPAROSCOPY DIAGNOSTIC, INTRAOPERATIVE ULTRASOUND, PERITONEAL BIOPSIES;  Surgeon: [name], MD;  Location: [facility];  Service: General;  Laterality: N/A;  GENERAL AND EPIDURAL  . LIVER BIOPSY  08/2018   Dr. Lindwood Coke  . MASTECTOMY  1992   right, with flap  . NM MYOCAR PERF WALL MOTION  06/11/2009   Protocol:[place], post stress EF58%, EKG negative for ischemia, low risk  . PORTACATH PLACEMENT N/A 12/02/2018   Procedure: INSERTION PORT-A-CATH;  Surgeon: [name], MD;  Location: [facility];  Service: General;  Laterality: N/A;  . RECONSTRUCTION BREAST W/ TRAM FLAP Right   . TONSILLECTOMY    . TRANSTHORACIC ECHOCARDIOGRAM  12/24/2009   LVEF =>55%, normal study  . UPPER GI ENDOSCOPY      I have reviewed the social history and family history with the patient and they are unchanged from previous note.  ALLERGIES:  has No Known Allergies.  MEDICATIONS:  Current Outpatient Medications  Medication Sig Dispense Refill  . amLODipine (NORVASC) 10 MG tablet TAKE 1 TABLET BY MOUTH EVERY DAY 90 tablet 3  . aspirin 81 MG tablet Take 81 mg by mouth at bedtime.     . Calcium Carbonate-Vitamin D 600-200 MG-UNIT TABS Take 1 tablet by mouth daily.    Marland Kitchen CALCIUM-MAGNESIUM-VITAMIN D PO Take 1 tablet by mouth once a week.     . Cholecalciferol (VITAMIN D3) 2000 UNITS TABS Take 1 tablet by mouth once a week.     . DULoxetine (CYMBALTA) 30 MG capsule Take 1 capsule (30 mg total) by mouth daily. (Patient not taking: Reported on 10/04/2019) 90 capsule 1  . Echinacea-Goldenseal (ECHINACEA COMB/GOLDEN SEAL) CAPS Take by mouth.    Marland Kitchen glucose blood test strip 1 each by Other route 2 (two) times daily. Use Onetouch verio test strips as instructed to check blood sugar twice daily. 100 each 2  . hydrochlorothiazide (HYDRODIURIL) 25 MG tablet Take 1 tablet (25  mg total) by mouth daily. 90 tablet 3  . HYDROcodone-acetaminophen (NORCO/VICODIN) 5-325 MG tablet Take 1 tablet by mouth every 6 (six) hours as needed for moderate pain. 10 tablet 0  . ketoconazole (NIZORAL) 2 % cream Apply 1 fingertip amount to each foot daily. 30 g 0  . lansoprazole (PREVACID) 15 MG capsule Take 1 capsule (15 mg total) by mouth daily. 90 capsule 1  . lidocaine-prilocaine (EMLA) cream Apply to affected area once 90 g 1  . loratadine (CLARITIN) 10 MG tablet Take 10 mg by mouth daily as needed for allergies.    . metFORMIN (GLUCOPHAGE-XR) 500 MG 24 hr tablet Take 1 tablet (500 mg total) by mouth 2 (two) times daily before a meal. 180 tablet 1  . metoprolol succinate (TOPROL-XL) 100 MG 24 hr tablet Take 1 tablet (100 mg total) by mouth daily. Take with or immediately following a meal. 90 tablet 1  . Omega 3 1000 MG CAPS Take 2,000 mg by mouth daily.     . ondansetron (ZOFRAN) 8 MG tablet Take 1 tablet (8 mg total) by mouth every 8 (eight) hours as needed for nausea or vomiting. 30 tablet 0  . prednisoLONE acetate (PRED FORTE) 1 % ophthalmic suspension     .  Prenatal Vit-Fe Fumarate-FA (MULTIVITAMIN-PRENATAL) 27-0.8 MG TABS tablet Take 1 tablet by mouth daily at 12 noon.    . prochlorperazine (COMPAZINE) 10 MG tablet Take 1 tablet (10 mg total) by mouth every 6 (six) hours as needed for nausea or vomiting. 30 tablet 0  . raloxifene (EVISTA) 60 MG tablet TAKE 1 TABLET DAILY 90 tablet 0  . rosuvastatin (CRESTOR) 20 MG tablet Take 1 tablet (20 mg total) by mouth daily. 90 tablet 1  . sitaGLIPtin (JANUVIA) 100 MG tablet Take 1 tablet (100 mg total) by mouth daily. 90 tablet 4  . triamcinolone cream (KENALOG) 0.1 % Apply 1 application topically 2 (two) times daily. 30 g 0  . valsartan (DIOVAN) 320 MG tablet Take 1 tablet (320 mg total) by mouth daily. 90 tablet 3   Current Facility-Administered Medications  Medication Dose Route Frequency Provider Last Rate Last Admin  . triamcinolone  acetonide (KENALOG) 10 MG/ML injection 10 mg  10 mg Other Once Harriet Masson, DPM       Facility-Administered Medications Ordered in Other Visits  Medication Dose Route Frequency Provider Last Rate Last Admin  . sodium chloride flush (NS) 0.9 % injection 10 mL  10 mL Intracatheter PRN [name], MD   10 mL at 10/18/19 1059    PHYSICAL EXAMINATION: ECOG PERFORMANCE STATUS: 2 - Symptomatic, <50% confined to bed  Vitals:   10/18/19 0842  BP: (!) 191/82  Pulse: 88  Resp: 20  Temp: 98.1 F (36.7 C)  SpO2: 97%   Filed Weights   10/18/19 0842  Weight: 130 lb 11.2 oz (59.3 kg)    Due to COVID19 we will limit examination to appearance. Patient had no complaints.  GENERAL:alert, no distress and comfortable SKIN: skin color normal, no rashes or significant lesions EYES: normal, Conjunctiva are pink and non-injected, sclera clear  NEURO: alert & oriented x 3 with fluent speech   LABORATORY DATA:  I have reviewed the data as listed CBC Latest Ref Rng & Units 10/18/2019 10/04/2019 09/20/2019  WBC 4.0 - 10.5 K/uL 9.3 7.0 8.1  Hemoglobin 12.0 - 15.0 g/dL 7.0(L) 9.3(L) 7.5(L)  Hematocrit 36 - 46 % 22.4(L) 29.2(L) 23.7(L)  Platelets 150 - 400 K/uL 297 207 330     CMP Latest Ref Rng & Units 10/18/2019 10/04/2019 09/20/2019  Glucose 70 - 99 mg/dL 147(H) 106(H) 93  BUN 8 - 23 mg/dL 27(H) 25(H) 18  Creatinine 0.44 - 1.00 mg/dL 1.33(H) 1.49(H) 1.44(H)  Sodium 135 - 145 mmol/L 141 141 141  Potassium 3.5 - 5.1 mmol/L 3.8 3.8 4.0  Chloride 98 - 111 mmol/L 110 107 108  CO2 22 - 32 mmol/L '22 27 22  '$ Calcium 8.9 - 10.3 mg/dL 8.8(L) 9.2 8.9  Total Protein 6.5 - 8.1 g/dL 6.8 7.1 7.0  Total Bilirubin 0.3 - 1.2 mg/dL 0.5 0.6 0.6  Alkaline Phos 38 - 126 U/L 68 65 53  AST 15 - 41 U/L '19 27 27  '$ ALT 0 - 44 U/L '7 8 7      '$ RADIOGRAPHIC STUDIES: I have personally reviewed the radiological images as listed and agreed with the findings in the report. No results found.   ASSESSMENT & PLAN:  KRISHAUNA SCHATZMAN  is a 79 y.o. female with    1.Intrahepatic cholangiocarcinoma, cT1N0M1,with peritoneal metastasis, MSS, IDH1 mutation (+) -She was diagnosed in 08/2018. Her biopsy of her liver mass shows adenocarcinoma,most consistent withcholangiocarcinoma.Her EGD/Colonoscopy from 7/10/20was negativeand 09/29/18 PET scanshowedno evidence of distant metastasis. -She was brought to OR  on 9/3,unfortunately she was found to have peritoneal metastasis to right diaphragm and surgery was aborted. -Given her metastatic cancer, Istarted her onsystemicchemo withFirst line chemoOxaliplatinand gemcitabine q2weeksto control her disease.She started on 11/30/18.Stopped Oxaliplatin on 05/03/19 due to worsening neuropathy.AddedCisplatin with C13 on 05/17/19.Due to progressing neuropathy cisplatin was stopped on 06/14/19 and she proceeding with single agent Gemcitabineevery 2 weeksstarting 06/28/19 with C16.Dose was reduced to '600mg'$ /m2 since 09/20/19 due to anemia.  -She is overall stable. She will continue neuropathy treatment with her chiropractor.  -Labs reviewed, CBC and CMP WNL except HG 7. Overall adequate to proceed with Gemcitabine today  -She is agreeable to continue Gemcitabine every 2 weeks.  -F/u in 2 weeks.   2. Anemia -Secondary to chemo -Shepreviouslyrequiredblood transfusionon1/13/21, 08/16/19, 10/11/19. Will continue prenatal vitamin.Her MMA was normal (08/09/19).  -Labs showed, Hg dropped to 7 today (10/18/19). Will proceed with blood transfusion on 8/7    3. Neuropathy, G3 -secondary to Oxaliplatin, dose was reduced and eventually dc'd after cycle 11 -Started cisplatin on 05/17/19, neuropathy worsened in her feet with unsteady gait and functional limitations.Cisplatin was dose reducedthen eventually stopped on 06/14/19. -Due to low tolerance and not much help, she stopped Lyrica and only uses Gabapentin as needed. She will continue Cymbalta.  -She has completed PT, but has not proceeded  with OT yet. She remains to have decreased function overall.  -She was previously seen by Dr Mickeal Skinner. She is now being seen by Chiropractor Dr Page Spiro who currently has her on a light and vibration treatment. I will hold on neurology referral to Dr. Evelena Leyden for now.    4. H/o right breast cancer, Geneticsnegative, Osteopenia  -s/p right mastectomy in 1992, per patient did not require adjuvant therapy. She was last seen by Dr. Jana Hakim in 2017.  -Her genetic testing wasnegativefor pathogeneticmutations. She did have VUS of gene APC. -She was seen to have right arm lymphedema on her 09/15/19 PET scan. -She notes she had remained on Raloxifene since her breast cancer and ran out recently. She notes she only continues given her mild bone density.  -I discussed her 06/2017 DEXA shows mild osteopenia. I recommend she return to her Gyn who she has not seen in years about this refill and continuing. I do not think she has to continue at this point.   5.Comorbidities:DM,HTN,hiatal hernia, GERD, renal artery stenosis -f/u with PCP -She did not tolerate Glucerna, she will continue low sugar protein shakes. -She is on amlodipine and HTCZ, metoprolol and losartan.HTN uncontrolled. I encouraged her to f/u with PCP. -Her HTCZ was held due to elevated Cr and then restarted on 09/20/19 due to worsened BP. I advised her to have her PCP adjust her HTN medications moving forward.  6. Family support, Goal of Care Discussion -She has a son and daughterwho are often busy but help as needed.  -She notes they are aware of her condition. Iencouraged her to keep her children updated and ask them for more home support. -The patient understands the goal of care is palliative. -she is full code now  7.CKD stage III -I strongly encouraged her to drink more water at least 40 ounces daily to maintain kidney function. -Was improving but has increased recently.Continue to monitor.Stable Cr at  1.2-15. -NP Laciepreviouslystopped her HCTZ on 08/23/19. Due to now elevated BP I restarted her on 09/20/19.  PLAN: -Labs reviewed and adequate to proceed with Gemcitabine today at same dose  -2u Blood transfusion on 8/7  -Lab, flush, f/u with me or  APP and Gemcitabine in 2, 4, 6 weeks   No problem-specific Assessment & Plan notes found for this encounter.   Orders Placed This Encounter  Procedures  . Care order/instruction    Transfuse Parameters    Standing Status:   Future    Standing Expiration Date:   10/17/2020    Scheduling Instructions:     To be transfused on 10/21/2019  . Type and screen    Standing Status:   Future    Number of Occurrences:   1    Standing Expiration Date:   10/17/2020    Scheduling Instructions:     Possible transfusion 8/7  . Type and screen    Standing Status:   Future    Standing Expiration Date:   10/17/2020    Scheduling Instructions:     To be transfused on 10/21/2019   All questions were answered. The patient knows to call the clinic with any problems, questions or concerns. No barriers to learning was detected. The total time spent in the appointment was 30 minutes.     [name], MD 10/18/2019   I, Joslyn Devon, am acting as scribe for [name], MD.   I have reviewed the above documentation for accuracy and completeness, and I agree with the above.

## 2019-10-18 ENCOUNTER — Encounter: Payer: Self-pay | Admitting: Hematology

## 2019-10-18 ENCOUNTER — Inpatient Hospital Stay (HOSPITAL_COMMUNITY)
Admission: EM | Admit: 2019-10-18 | Discharge: 2019-10-22 | DRG: 291 | Disposition: A | Discharge status: Home Health | Payer: Medicare Other | Attending: Family Medicine | Admitting: Family Medicine

## 2019-10-18 ENCOUNTER — Encounter (HOSPITAL_COMMUNITY): Payer: Self-pay

## 2019-10-18 ENCOUNTER — Other Ambulatory Visit: Payer: Self-pay

## 2019-10-18 ENCOUNTER — Emergency Department (HOSPITAL_COMMUNITY): Payer: Medicare Other

## 2019-10-18 ENCOUNTER — Inpatient Hospital Stay: Payer: Medicare Other

## 2019-10-18 ENCOUNTER — Inpatient Hospital Stay: Payer: Medicare Other | Attending: Adult Health

## 2019-10-18 ENCOUNTER — Inpatient Hospital Stay (HOSPITAL_BASED_OUTPATIENT_CLINIC_OR_DEPARTMENT_OTHER): Payer: Medicare Other | Admitting: Hematology

## 2019-10-18 VITALS — BP 191/82 | HR 88 | Temp 98.1°F | Resp 20 | Ht 65.0 in | Wt 130.7 lb

## 2019-10-18 VITALS — BP 166/71 | HR 76

## 2019-10-18 DIAGNOSIS — Z20822 Contact with and (suspected) exposure to covid-19: Secondary | ICD-10-CM | POA: Diagnosis present

## 2019-10-18 DIAGNOSIS — Z853 Personal history of malignant neoplasm of breast: Secondary | ICD-10-CM | POA: Diagnosis not present

## 2019-10-18 DIAGNOSIS — Z833 Family history of diabetes mellitus: Secondary | ICD-10-CM

## 2019-10-18 DIAGNOSIS — T451X5A Adverse effect of antineoplastic and immunosuppressive drugs, initial encounter: Secondary | ICD-10-CM | POA: Diagnosis not present

## 2019-10-18 DIAGNOSIS — Z8042 Family history of malignant neoplasm of prostate: Secondary | ICD-10-CM | POA: Diagnosis not present

## 2019-10-18 DIAGNOSIS — Z7982 Long term (current) use of aspirin: Secondary | ICD-10-CM

## 2019-10-18 DIAGNOSIS — Z8505 Personal history of malignant neoplasm of liver: Secondary | ICD-10-CM | POA: Diagnosis not present

## 2019-10-18 DIAGNOSIS — D649 Anemia, unspecified: Secondary | ICD-10-CM

## 2019-10-18 DIAGNOSIS — E1151 Type 2 diabetes mellitus with diabetic peripheral angiopathy without gangrene: Secondary | ICD-10-CM | POA: Diagnosis present

## 2019-10-18 DIAGNOSIS — I509 Heart failure, unspecified: Secondary | ICD-10-CM | POA: Diagnosis not present

## 2019-10-18 DIAGNOSIS — C786 Secondary malignant neoplasm of retroperitoneum and peritoneum: Secondary | ICD-10-CM | POA: Insufficient documentation

## 2019-10-18 DIAGNOSIS — J189 Pneumonia, unspecified organism: Secondary | ICD-10-CM | POA: Diagnosis present

## 2019-10-18 DIAGNOSIS — C221 Intrahepatic bile duct carcinoma: Secondary | ICD-10-CM | POA: Diagnosis present

## 2019-10-18 DIAGNOSIS — I161 Hypertensive emergency: Secondary | ICD-10-CM

## 2019-10-18 DIAGNOSIS — Z8701 Personal history of pneumonia (recurrent): Secondary | ICD-10-CM | POA: Diagnosis not present

## 2019-10-18 DIAGNOSIS — N183 Chronic kidney disease, stage 3 unspecified: Secondary | ICD-10-CM

## 2019-10-18 DIAGNOSIS — R0602 Shortness of breath: Secondary | ICD-10-CM | POA: Diagnosis not present

## 2019-10-18 DIAGNOSIS — J80 Acute respiratory distress syndrome: Secondary | ICD-10-CM | POA: Diagnosis not present

## 2019-10-18 DIAGNOSIS — Z7984 Long term (current) use of oral hypoglycemic drugs: Secondary | ICD-10-CM

## 2019-10-18 DIAGNOSIS — J9601 Acute respiratory failure with hypoxia: Secondary | ICD-10-CM | POA: Diagnosis not present

## 2019-10-18 DIAGNOSIS — Z823 Family history of stroke: Secondary | ICD-10-CM

## 2019-10-18 DIAGNOSIS — X58XXXA Exposure to other specified factors, initial encounter: Secondary | ICD-10-CM | POA: Diagnosis present

## 2019-10-18 DIAGNOSIS — I071 Rheumatic tricuspid insufficiency: Secondary | ICD-10-CM | POA: Diagnosis present

## 2019-10-18 DIAGNOSIS — Z9841 Cataract extraction status, right eye: Secondary | ICD-10-CM | POA: Diagnosis not present

## 2019-10-18 DIAGNOSIS — Z66 Do not resuscitate: Secondary | ICD-10-CM | POA: Diagnosis not present

## 2019-10-18 DIAGNOSIS — Z7189 Other specified counseling: Secondary | ICD-10-CM | POA: Diagnosis not present

## 2019-10-18 DIAGNOSIS — Z8249 Family history of ischemic heart disease and other diseases of the circulatory system: Secondary | ICD-10-CM

## 2019-10-18 DIAGNOSIS — E1122 Type 2 diabetes mellitus with diabetic chronic kidney disease: Secondary | ICD-10-CM | POA: Diagnosis present

## 2019-10-18 DIAGNOSIS — E1121 Type 2 diabetes mellitus with diabetic nephropathy: Secondary | ICD-10-CM | POA: Diagnosis present

## 2019-10-18 DIAGNOSIS — J810 Acute pulmonary edema: Secondary | ICD-10-CM | POA: Diagnosis not present

## 2019-10-18 DIAGNOSIS — I10 Essential (primary) hypertension: Secondary | ICD-10-CM

## 2019-10-18 DIAGNOSIS — Z9011 Acquired absence of right breast and nipple: Secondary | ICD-10-CM | POA: Diagnosis not present

## 2019-10-18 DIAGNOSIS — Z9842 Cataract extraction status, left eye: Secondary | ICD-10-CM

## 2019-10-18 DIAGNOSIS — Z87891 Personal history of nicotine dependence: Secondary | ICD-10-CM

## 2019-10-18 DIAGNOSIS — Z5111 Encounter for antineoplastic chemotherapy: Secondary | ICD-10-CM | POA: Insufficient documentation

## 2019-10-18 DIAGNOSIS — E785 Hyperlipidemia, unspecified: Secondary | ICD-10-CM | POA: Diagnosis present

## 2019-10-18 DIAGNOSIS — M8580 Other specified disorders of bone density and structure, unspecified site: Secondary | ICD-10-CM | POA: Diagnosis present

## 2019-10-18 DIAGNOSIS — G620 Drug-induced polyneuropathy: Secondary | ICD-10-CM

## 2019-10-18 DIAGNOSIS — Z803 Family history of malignant neoplasm of breast: Secondary | ICD-10-CM | POA: Diagnosis not present

## 2019-10-18 DIAGNOSIS — R000 Tachycardia, unspecified: Secondary | ICD-10-CM | POA: Diagnosis not present

## 2019-10-18 DIAGNOSIS — D6481 Anemia due to antineoplastic chemotherapy: Secondary | ICD-10-CM | POA: Diagnosis present

## 2019-10-18 DIAGNOSIS — Z515 Encounter for palliative care: Secondary | ICD-10-CM

## 2019-10-18 DIAGNOSIS — R0902 Hypoxemia: Secondary | ICD-10-CM | POA: Diagnosis not present

## 2019-10-18 DIAGNOSIS — I130 Hypertensive heart and chronic kidney disease with heart failure and stage 1 through stage 4 chronic kidney disease, or unspecified chronic kidney disease: Principal | ICD-10-CM | POA: Diagnosis present

## 2019-10-18 DIAGNOSIS — N1831 Chronic kidney disease, stage 3a: Secondary | ICD-10-CM | POA: Diagnosis present

## 2019-10-18 DIAGNOSIS — I517 Cardiomegaly: Secondary | ICD-10-CM | POA: Diagnosis not present

## 2019-10-18 DIAGNOSIS — I361 Nonrheumatic tricuspid (valve) insufficiency: Secondary | ICD-10-CM | POA: Diagnosis not present

## 2019-10-18 DIAGNOSIS — I5031 Acute diastolic (congestive) heart failure: Secondary | ICD-10-CM | POA: Diagnosis present

## 2019-10-18 DIAGNOSIS — Z79899 Other long term (current) drug therapy: Secondary | ICD-10-CM | POA: Diagnosis not present

## 2019-10-18 DIAGNOSIS — J811 Chronic pulmonary edema: Secondary | ICD-10-CM | POA: Diagnosis not present

## 2019-10-18 DIAGNOSIS — R0689 Other abnormalities of breathing: Secondary | ICD-10-CM | POA: Diagnosis not present

## 2019-10-18 LAB — CBC WITH DIFFERENTIAL/PLATELET
Abs Immature Granulocytes: 0.22 10*3/uL — ABNORMAL HIGH (ref 0.00–0.07)
Basophils Absolute: 0 10*3/uL (ref 0.0–0.1)
Basophils Relative: 0 %
Eosinophils Absolute: 0.1 10*3/uL (ref 0.0–0.5)
Eosinophils Relative: 0 %
HCT: 25.7 % — ABNORMAL LOW (ref 36.0–46.0)
Hemoglobin: 7.9 g/dL — ABNORMAL LOW (ref 12.0–15.0)
Immature Granulocytes: 1 %
Lymphocytes Relative: 2 %
Lymphs Abs: 0.4 10*3/uL — ABNORMAL LOW (ref 0.7–4.0)
MCH: 30.6 pg (ref 26.0–34.0)
MCHC: 30.7 g/dL (ref 30.0–36.0)
MCV: 99.6 fL (ref 80.0–100.0)
Monocytes Absolute: 0.8 10*3/uL (ref 0.1–1.0)
Monocytes Relative: 4 %
Neutro Abs: 17.6 10*3/uL — ABNORMAL HIGH (ref 1.7–7.7)
Neutrophils Relative %: 93 %
Platelets: 379 10*3/uL (ref 150–400)
RBC: 2.58 MIL/uL — ABNORMAL LOW (ref 3.87–5.11)
RDW: 18.4 % — ABNORMAL HIGH (ref 11.5–15.5)
WBC: 19.1 10*3/uL — ABNORMAL HIGH (ref 4.0–10.5)
nRBC: 0 % (ref 0.0–0.2)

## 2019-10-18 LAB — COMPREHENSIVE METABOLIC PANEL
ALT: 11 U/L (ref 0–44)
AST: 27 U/L (ref 15–41)
Albumin: 3.1 g/dL — ABNORMAL LOW (ref 3.5–5.0)
Alkaline Phosphatase: 53 U/L (ref 38–126)
Anion gap: 10 (ref 5–15)
BUN: 26 mg/dL — ABNORMAL HIGH (ref 8–23)
CO2: 21 mmol/L — ABNORMAL LOW (ref 22–32)
Calcium: 8.1 mg/dL — ABNORMAL LOW (ref 8.9–10.3)
Chloride: 105 mmol/L (ref 98–111)
Creatinine, Ser: 1.32 mg/dL — ABNORMAL HIGH (ref 0.44–1.00)
GFR calc Af Amer: 44 mL/min — ABNORMAL LOW (ref >60)
GFR calc non Af Amer: 38 mL/min — ABNORMAL LOW (ref >60)
Glucose, Bld: 172 mg/dL — ABNORMAL HIGH (ref 70–99)
Potassium: 4 mmol/L (ref 3.5–5.1)
Sodium: 136 mmol/L (ref 135–145)
Total Bilirubin: 0.9 mg/dL (ref 0.3–1.2)
Total Protein: 7.8 g/dL (ref 6.5–8.1)

## 2019-10-18 LAB — CBC WITH DIFFERENTIAL (CANCER CENTER ONLY)
Abs Immature Granulocytes: 0.05 10*3/uL (ref 0.00–0.07)
Basophils Absolute: 0 10*3/uL (ref 0.0–0.1)
Basophils Relative: 0 %
Eosinophils Absolute: 0.2 10*3/uL (ref 0.0–0.5)
Eosinophils Relative: 2 %
HCT: 22.4 % — ABNORMAL LOW (ref 36.0–46.0)
Hemoglobin: 7 g/dL — ABNORMAL LOW (ref 12.0–15.0)
Immature Granulocytes: 1 %
Lymphocytes Relative: 9 %
Lymphs Abs: 0.8 10*3/uL (ref 0.7–4.0)
MCH: 30.4 pg (ref 26.0–34.0)
MCHC: 31.3 g/dL (ref 30.0–36.0)
MCV: 97.4 fL (ref 80.0–100.0)
Monocytes Absolute: 1.2 10*3/uL — ABNORMAL HIGH (ref 0.1–1.0)
Monocytes Relative: 13 %
Neutro Abs: 7.1 10*3/uL (ref 1.7–7.7)
Neutrophils Relative %: 75 %
Platelet Count: 297 10*3/uL (ref 150–400)
RBC: 2.3 MIL/uL — ABNORMAL LOW (ref 3.87–5.11)
RDW: 18.1 % — ABNORMAL HIGH (ref 11.5–15.5)
WBC Count: 9.3 10*3/uL (ref 4.0–10.5)
nRBC: 0 % (ref 0.0–0.2)

## 2019-10-18 LAB — SARS CORONAVIRUS 2 BY RT PCR (HOSPITAL ORDER, PERFORMED IN ~~LOC~~ HOSPITAL LAB): SARS Coronavirus 2: NEGATIVE

## 2019-10-18 LAB — CMP (CANCER CENTER ONLY)
ALT: 7 U/L (ref 0–44)
AST: 19 U/L (ref 15–41)
Albumin: 2.8 g/dL — ABNORMAL LOW (ref 3.5–5.0)
Alkaline Phosphatase: 68 U/L (ref 38–126)
Anion gap: 9 (ref 5–15)
BUN: 27 mg/dL — ABNORMAL HIGH (ref 8–23)
CO2: 22 mmol/L (ref 22–32)
Calcium: 8.8 mg/dL — ABNORMAL LOW (ref 8.9–10.3)
Chloride: 110 mmol/L (ref 98–111)
Creatinine: 1.33 mg/dL — ABNORMAL HIGH (ref 0.44–1.00)
GFR, Est AFR Am: 44 mL/min — ABNORMAL LOW (ref >60)
GFR, Estimated: 38 mL/min — ABNORMAL LOW (ref >60)
Glucose, Bld: 147 mg/dL — ABNORMAL HIGH (ref 70–99)
Potassium: 3.8 mmol/L (ref 3.5–5.1)
Sodium: 141 mmol/L (ref 135–145)
Total Bilirubin: 0.5 mg/dL (ref 0.3–1.2)
Total Protein: 6.8 g/dL (ref 6.5–8.1)

## 2019-10-18 LAB — MAGNESIUM: Magnesium: 2.4 mg/dL (ref 1.7–2.4)

## 2019-10-18 MED ORDER — PROCHLORPERAZINE MALEATE 10 MG PO TABS
10.0000 mg | ORAL_TABLET | Freq: Once | ORAL | Status: AC
  Administered 2019-10-18: 10 mg via ORAL

## 2019-10-18 MED ORDER — INSULIN ASPART 100 UNIT/ML ~~LOC~~ SOLN: 0.0000 [IU] | Freq: Three times a day (TID) | SUBCUTANEOUS | Status: DC

## 2019-10-18 MED ORDER — NITROGLYCERIN IN D5W 200-5 MCG/ML-% IV SOLN
0.0000 ug/min | INTRAVENOUS | Status: DC
  Administered 2019-10-18: 15 ug/min via INTRAVENOUS

## 2019-10-18 MED ORDER — ACETAMINOPHEN 325 MG PO TABS: 650.0000 mg | ORAL_TABLET | ORAL | Status: DC | PRN

## 2019-10-18 MED ORDER — ASPIRIN 81 MG PO CHEW
81.0000 mg | CHEWABLE_TABLET | Freq: Every day | ORAL | Status: DC
  Administered 2019-10-19 – 2019-10-22 (×4): 81 mg via ORAL
  Filled 2019-10-18 (×4): qty 1

## 2019-10-18 MED ORDER — INSULIN ASPART 100 UNIT/ML ~~LOC~~ SOLN: 0.0000 [IU] | Freq: Every day | SUBCUTANEOUS | Status: DC

## 2019-10-18 MED ORDER — SODIUM CHLORIDE 0.9 % IV SOLN
600.0000 mg/m2 | Freq: Once | INTRAVENOUS | Status: AC
  Administered 2019-10-18: 988 mg via INTRAVENOUS
  Filled 2019-10-18: qty 25.98

## 2019-10-18 MED ORDER — SODIUM CHLORIDE 0.9 % IV SOLN
1.0000 g | INTRAVENOUS | Status: DC
  Administered 2019-10-19 – 2019-10-21 (×4): 1 g via INTRAVENOUS
  Filled 2019-10-18 (×5): qty 10

## 2019-10-18 MED ORDER — SODIUM CHLORIDE 0.9% FLUSH
3.0000 mL | INTRAVENOUS | Status: DC | PRN
  Administered 2019-10-19 – 2019-10-20 (×3): 3 mL via INTRAVENOUS

## 2019-10-18 MED ORDER — CHLORHEXIDINE GLUCONATE CLOTH 2 % EX PADS
6.0000 | MEDICATED_PAD | Freq: Every day | CUTANEOUS | Status: DC
  Administered 2019-10-19 – 2019-10-22 (×4): 6 via TOPICAL

## 2019-10-18 MED ORDER — PANTOPRAZOLE SODIUM 20 MG PO TBEC
20.0000 mg | DELAYED_RELEASE_TABLET | Freq: Every day | ORAL | Status: DC
  Administered 2019-10-19 – 2019-10-22 (×4): 20 mg via ORAL
  Filled 2019-10-18 (×6): qty 1

## 2019-10-18 MED ORDER — SODIUM CHLORIDE 0.9% FLUSH
3.0000 mL | Freq: Two times a day (BID) | INTRAVENOUS | Status: DC
  Administered 2019-10-19 – 2019-10-22 (×7): 3 mL via INTRAVENOUS

## 2019-10-18 MED ORDER — PROCHLORPERAZINE MALEATE 10 MG PO TABS
ORAL_TABLET | ORAL | Status: AC
  Filled 2019-10-18: qty 1

## 2019-10-18 MED ORDER — SODIUM CHLORIDE 0.9 % IV SOLN: 250.0000 mL | INTRAVENOUS | Status: DC | PRN

## 2019-10-18 MED ORDER — ENOXAPARIN SODIUM 40 MG/0.4ML ~~LOC~~ SOLN: 40.0000 mg | SUBCUTANEOUS | Status: DC

## 2019-10-18 MED ORDER — SODIUM CHLORIDE 0.9% FLUSH
10.0000 mL | Freq: Two times a day (BID) | INTRAVENOUS | Status: DC
  Administered 2019-10-19 – 2019-10-22 (×6): 10 mL

## 2019-10-18 MED ORDER — SODIUM CHLORIDE 0.9% FLUSH: 10.0000 mL | INTRAVENOUS | Status: DC | PRN

## 2019-10-18 MED ORDER — SODIUM CHLORIDE 0.9 % IV SOLN
Freq: Once | INTRAVENOUS | Status: AC
  Filled 2019-10-18: qty 250

## 2019-10-18 MED ORDER — HEPARIN SOD (PORK) LOCK FLUSH 100 UNIT/ML IV SOLN
500.0000 [IU] | Freq: Once | INTRAVENOUS | Status: AC | PRN
  Administered 2019-10-18: 500 [IU]
  Filled 2019-10-18: qty 5

## 2019-10-18 MED ORDER — FUROSEMIDE 10 MG/ML IJ SOLN
20.0000 mg | Freq: Once | INTRAMUSCULAR | Status: AC
  Administered 2019-10-18: 20 mg via INTRAVENOUS
  Filled 2019-10-18: qty 2

## 2019-10-18 MED ORDER — SODIUM CHLORIDE 0.9 % IV SOLN
500.0000 mg | INTRAVENOUS | Status: DC
  Administered 2019-10-19 – 2019-10-22 (×4): 500 mg via INTRAVENOUS
  Filled 2019-10-18 (×4): qty 500

## 2019-10-18 MED ORDER — SODIUM CHLORIDE 0.9% FLUSH
10.0000 mL | INTRAVENOUS | Status: DC | PRN
  Administered 2019-10-18: 10 mL
  Filled 2019-10-18: qty 10

## 2019-10-18 MED ORDER — NITROGLYCERIN IN D5W 200-5 MCG/ML-% IV SOLN
2.5000 ug/min | INTRAVENOUS | Status: DC
  Administered 2019-10-18: 2.5 ug/min via INTRAVENOUS
  Filled 2019-10-18: qty 250

## 2019-10-18 NOTE — H&P (Signed)
History and Physical    Donna May XFG:[id number] DOB: 1940-04-20 DOA: 10/18/2019  PCP: [name], MD Patient coming from: Home  Chief Complaint: Shortness of breath  HPI: Donna May is a 79 y.o. female with medical history significant of intrahepatic cholangiocarcinoma with peritoneal metastasis on chemotherapy, history of right breast cancer status postmastectomy in 1992, right arm lymphedema, osteopenia, transfusion dependent anemia, neuropathy, hypertension, non-insulin-dependent type II diabetes, GERD, hiatal hernia, renal artery stenosis, CKD stage III presenting to the ED via EMS with complaints of sudden onset shortness of breath at home at rest.  Upon EMS arrival, patient was hypoxic to the mid 80s on room air.  She was placed on 15 L oxygen via nonrebreather and oxygen saturation improved to 95%.  She was AAO x4.  Hypertensive with systolic in the 967E.  Patient states she received chemo this morning.  She then went home had food and took a nap.  This afternoon she woke up from her nap with sudden onset shortness of breath.  Denies chest pain or cough.  Denies having any shortness of breath prior to this episode.  States her blood pressure has been "up and down."  She has not checked it recently and thinks she might have missed doses of her blood pressure medications.  Patient has no other complaints.  Denies fevers, nausea, vomiting, abdominal pain, or diarrhea.  ED Course: Afebrile.  Not tachycardic.  Tachypneic with respiratory rate up to 40.  Systolic in the 938B.  Initially required BiPAP due to increased work of breathing.  Labs showing leukocytosis with WBC count 19.1 with left shift.  Hemoglobin 7.9.  Platelet count normal.  Creatinine 1.3, at baseline.  SARS-CoV-2 PCR test pending.  EKG without acute ischemic changes.  Chest x-ray showing diffuse interstitial and airspace disease worse in the right chest/right lower lobe.  Findings suspicious for multifocal pneumonia  versus asymmetric pulmonary edema versus lymphangitic tumor.  Patient was given IV Lasix 20 mg and started on nitroglycerin infusion.  Review of Systems:  All systems reviewed and apart from history of presenting illness, are negative.  Past Medical History:  Diagnosis Date  . Anemia   . Anxiety   . Arthritis   . Breast cancer ([place]) 1992  . Cataract    Bilateral eyes - surgery to remove  . Chronic kidney disease    CKD stage 3  . Complication of anesthesia    "something they use make me itch for a couple of days."  . Depression   . Diabetes mellitus    type 2  . Duodenitis 01/18/2002  . Fainting spell   . Family history of breast cancer   . Family history of prostate cancer   . GERD (gastroesophageal reflux disease)   . Heart murmur    never has caused any problems  . Hiatal hernia 08/08/2008, 01/18/2002  . History of pneumonia    x 2  . Hyperlipidemia   . Hypertension   . Liver cancer ([place]) 08/2018  . Lymphedema 2017   Right arm  . Pneumonia    x 2  . UTI (lower urinary tract infection)     Past Surgical History:  Procedure Laterality Date  . AXILLARY SURGERY     cyst removal, right  . COLONOSCOPY  09/23/2018   Dr. Havery Moros - polyps  . EYE SURGERY Bilateral    cataracts to remove  . LAPAROSCOPY N/A 11/17/2018   Procedure: LAPAROSCOPY DIAGNOSTIC, INTRAOPERATIVE ULTRASOUND, PERITONEAL BIOPSIES;  Surgeon: Barry Dienes,  Dorris Fetch, MD;  Location: [facility];  Service: General;  Laterality: N/A;  GENERAL AND EPIDURAL  . LIVER BIOPSY  08/2018   Dr. Lindwood Coke  . MASTECTOMY  1992   right, with flap  . NM MYOCAR PERF WALL MOTION  06/11/2009   Protocol:[place], post stress EF58%, EKG negative for ischemia, low risk  . PORTACATH PLACEMENT N/A 12/02/2018   Procedure: INSERTION PORT-A-CATH;  Surgeon: [name], MD;  Location: [facility];  Service: General;  Laterality: N/A;  . RECONSTRUCTION BREAST W/ TRAM FLAP Right   . TONSILLECTOMY    . TRANSTHORACIC ECHOCARDIOGRAM  12/24/2009   LVEF  =>55%, normal study  . UPPER GI ENDOSCOPY       reports that she quit smoking about 31 years ago. Her smoking use included cigarettes. She has a 6.25 pack-year smoking history. She has never used smokeless tobacco. She reports previous alcohol use of about 1.0 standard drink of alcohol per week. She reports that she does not use drugs.  No Known Allergies  Family History  Problem Relation Age of Onset  . Breast cancer Cousin        diagnosed >50; mother's first cousins  . Diabetes Brother   . Heart disease Mother   . Hypertension Mother   . Heart disease Father   . Hypertension Father   . Prostate cancer Brother 58  . Heart attack Maternal Grandmother   . Stroke Maternal Grandfather   . Colon cancer Neg Hx   . Esophageal cancer Neg Hx   . Stomach cancer Neg Hx   . Rectal cancer Neg Hx     Prior to Admission medications   Medication Sig Start Date End Date Taking? Authorizing Provider  amLODipine (NORVASC) 10 MG tablet TAKE 1 TABLET BY MOUTH EVERY DAY 03/03/19   [name], Mihai, MD  aspirin 81 MG tablet Take 81 mg by mouth at bedtime.     [provider]  Calcium Carbonate-Vitamin D 600-200 MG-UNIT TABS Take 1 tablet by mouth daily.    [provider]  CALCIUM-MAGNESIUM-VITAMIN D PO Take 1 tablet by mouth once a week.     [provider]  Cholecalciferol (VITAMIN D3) 2000 UNITS TABS Take 1 tablet by mouth once a week.     [provider]  DULoxetine (CYMBALTA) 30 MG capsule Take 1 capsule (30 mg total) by mouth daily. Patient not taking: Reported on 10/04/2019 07/12/19   [name], MD  Echinacea-Goldenseal [facility] COMB/GOLDEN SEAL) CAPS Take by mouth.    [provider]  glucose blood test strip 1 each by Other route 2 (two) times daily. Use Onetouch verio test strips as instructed to check blood sugar twice daily. 01/05/19   Elayne Snare, MD  hydrochlorothiazide (HYDRODIURIL) 25 MG tablet Take 1 tablet (25 mg total) by mouth daily.  03/06/19   [name], Mihai, MD  HYDROcodone-acetaminophen (NORCO/VICODIN) 5-325 MG tablet Take 1 tablet by mouth every 6 (six) hours as needed for moderate pain. 12/02/18   [name], MD  ketoconazole (NIZORAL) 2 % cream Apply 1 fingertip amount to each foot daily. 09/23/17   Evelina Bucy, DPM  lansoprazole (PREVACID) 15 MG capsule Take 1 capsule (15 mg total) by mouth daily. 06/08/19   [name], [name], MD  lidocaine-prilocaine (EMLA) cream Apply to affected area once 05/31/19   Alla Feeling, NP  loratadine (CLARITIN) 10 MG tablet Take 10 mg by mouth daily as needed for allergies.    [provider]  metFORMIN (GLUCOPHAGE-XR) 500 MG 24  hr tablet Take 1 tablet (500 mg total) by mouth 2 (two) times daily before a meal. 08/01/18   [name], MD  metoprolol succinate (TOPROL-XL) 100 MG 24 hr tablet Take 1 tablet (100 mg total) by mouth daily. Take with or immediately following a meal. 01/26/19   [name], Mihai, MD  Omega 3 1000 MG CAPS Take 2,000 mg by mouth daily.     [provider]  ondansetron (ZOFRAN) 8 MG tablet Take 1 tablet (8 mg total) by mouth every 8 (eight) hours as needed for nausea or vomiting. 11/28/18   [name], MD  prednisoLONE acetate (PRED FORTE) 1 % ophthalmic suspension  09/21/19   [provider]  Prenatal Vit-Fe Fumarate-FA (MULTIVITAMIN-PRENATAL) 27-0.8 MG TABS tablet Take 1 tablet by mouth daily at 12 noon.    [provider]  prochlorperazine (COMPAZINE) 10 MG tablet Take 1 tablet (10 mg total) by mouth every 6 (six) hours as needed for nausea or vomiting. 11/28/18   [name], MD  raloxifene (EVISTA) 60 MG tablet TAKE 1 TABLET DAILY 12/05/18   [name], MD  rosuvastatin (CRESTOR) 20 MG tablet Take 1 tablet (20 mg total) by mouth daily. 01/26/19   [name], Mihai, MD  sitaGLIPtin (JANUVIA) 100 MG tablet Take 1 tablet (100 mg total) by mouth daily. 01/20/19   Elayne Snare, MD  triamcinolone cream (KENALOG) 0.1 % Apply 1  application topically 2 (two) times daily. 09/06/17   Marletta Lor, MD  valsartan (DIOVAN) 320 MG tablet Take 1 tablet (320 mg total) by mouth daily. 03/06/19   Sanda Klein, MD    Physical Exam: Vitals:   10/18/19 2135 10/18/19 2137 10/18/19 2315 10/18/19 2330  BP:   (!) 160/80 (!) 180/83  Pulse: 80  73 79  Resp:   (!) 22 (!) 29  Temp:      TempSrc:      SpO2: 100% 94% 100% 100%  Weight:      Height:        Physical Exam Constitutional:      General: She is not in acute distress. HENT:     Head: Normocephalic and atraumatic.     Mouth/Throat:     Mouth: Mucous membranes are moist.     Pharynx: Oropharynx is clear.  Eyes:     Extraocular Movements: Extraocular movements intact.     Conjunctiva/sclera: Conjunctivae normal.  Neck:     Comments: +JVD Cardiovascular:     Rate and Rhythm: Normal rate and regular rhythm.     Pulses: Normal pulses.  Pulmonary:     Effort: Pulmonary effort is normal. No respiratory distress.     Breath sounds: Normal breath sounds. No wheezing or rales.  Abdominal:     General: Bowel sounds are normal. There is no distension.     Palpations: Abdomen is soft.     Tenderness: There is no abdominal tenderness. There is no guarding.  Musculoskeletal:     Cervical back: Normal range of motion and neck supple.     Comments: Trace pedal edema bilaterally  Skin:    General: Skin is warm and dry.  Neurological:     General: No focal deficit present.     Mental Status: She is alert and oriented to person, place, and time.     Labs on Admission: I have personally reviewed following labs and imaging studies  CBC: Recent Labs  Lab 10/18/19 0830 10/18/19 1944  WBC 9.3 19.1*  NEUTROABS 7.1 17.6*  HGB  7.0* 7.9*  HCT 22.4* 25.7*  MCV 97.4 99.6  PLT 297 607   Basic Metabolic Panel: Recent Labs  Lab 10/18/19 0830 10/18/19 1944  NA 141 136  K 3.8 4.0  CL 110 105  CO2 22 21*  GLUCOSE 147* 172*  BUN 27* 26*  CREATININE 1.33*  1.32*  CALCIUM 8.8* 8.1*  MG 2.4  --    GFR: Estimated Creatinine Clearance: 30.6 mL/min (A) (by C-G formula based on SCr of 1.32 mg/dL (H)). Liver Function Tests: Recent Labs  Lab 10/18/19 0830 10/18/19 1944  AST 19 27  ALT 7 11  ALKPHOS 68 53  BILITOT 0.5 0.9  PROT 6.8 7.8  ALBUMIN 2.8* 3.1*   No results for input(s): LIPASE, AMYLASE in the last 168 hours. No results for input(s): AMMONIA in the last 168 hours. Coagulation Profile: No results for input(s): INR, PROTIME in the last 168 hours. Cardiac Enzymes: No results for input(s): CKTOTAL, CKMB, CKMBINDEX, TROPONINI in the last 168 hours. BNP (last 3 results) No results for input(s): PROBNP in the last 8760 hours. HbA1C: No results for input(s): HGBA1C in the last 72 hours. CBG: No results for input(s): GLUCAP in the last 168 hours. Lipid Profile: No results for input(s): CHOL, HDL, LDLCALC, TRIG, CHOLHDL, LDLDIRECT in the last 72 hours. Thyroid Function Tests: No results for input(s): TSH, T4TOTAL, FREET4, T3FREE, THYROIDAB in the last 72 hours. Anemia Panel: No results for input(s): VITAMINB12, FOLATE, FERRITIN, TIBC, IRON, RETICCTPCT in the last 72 hours. Urine analysis: No results found for: COLORURINE, APPEARANCEUR, [place], [place], GLUCOSEU, HGBUR, BILIRUBINUR, KETONESUR, PROTEINUR, UROBILINOGEN, NITRITE, LEUKOCYTESUR  Radiological Exams on Admission: DG Chest Portable 1 View  Result Date: 10/18/2019 CLINICAL DATA:  Shortness of breath. EXAM: PORTABLE CHEST 1 VIEW COMPARISON:  December 02, 2018 FINDINGS: LEFT-sided Port-A-Cath terminates at distal aspect of the superior vena cava. Heart size is stable. Hilar structures are partially obscured by diffuse interstitial and airspace disease worse in the RIGHT chest and worse in the RIGHT lower lobe. No sign of pleural effusion. Near confluent disease partially obscures the RIGHT hemidiaphragm. RIGHT heart border is distinct. Visualized skeletal structures on limited  assessment without acute process. IMPRESSION: 1. Diffuse interstitial and airspace disease worse in the RIGHT chest and worse in the RIGHT lower lobe. 2. Findings above could represent and are most suspicious for multifocal pneumonia. Asymmetric pulmonary edema and lymphangitic tumor are considered. Laboratory correlation may be helpful for any signs of heart failure with chest CT as warranted. Electronically Signed   By: Zetta Bills M.D.   On: 10/18/2019 19:12    EKG: Independently reviewed.  Sinus rhythm, no acute ischemic changes.  Assessment/Plan Principal Problem:   Acute respiratory failure with hypoxia (HCC) Active Problems:   Intrahepatic cholangiocarcinoma (HCC)   Hypertensive emergency   Anemia   CKD (chronic kidney disease), stage III   Acute hypoxic respiratory failure - suspect due to possible flash pulmonary edema versus possible multifocal pneumonia: Hypoxic to the mid 80s on room air with EMS.  Initially required BiPAP in the ED for tachypnea/increased work of breathing, currently stable on 2 L supplemental oxygen and work of breathing has improved significantly. Chest x-ray showing diffuse interstitial and airspace disease worse in the right chest/right lower lobe.  Findings suspicious for multifocal pneumonia versus asymmetric pulmonary edema versus ?lymphangitic tumor.  No documented history of CHF and EF normal on stress test done in 2018.  No complaints of chest pain and EKG without acute ischemic changes.  SARS-CoV-2  PCR test negative.  Given significantly elevated blood pressure readings and sudden onset shortness of breath, suspect patient's presentation is due to flash pulmonary edema.  However, since chest x-ray findings are unilateral and she does have leukocytosis on labs, multifocal pneumonia is also a possibility. -Continuous pulse ox, supplemental oxygen.  Continue nitroglycerin infusion for hypertensive emergency.  IV Lasix 20 mg administered in the ED, reassess in  a.m. and order additional doses as needed.  Check BNP and high-sensitivity troponin level. Order echocardiogram.  Start ceftriaxone and azithromycin for coverage of possible pneumonia.  Check lactic acid and procalcitonin level.  Hypertensive emergency: Suspect due to medication noncompliance.  Blood pressure significantly elevated with systolic in the 814G on arrival and evidence of endorgan damage with hypoxia/ chest x-ray concerning for possible flash pulmonary edema.  Started on nitroglycerin infusion in the ED and systolic currently in the 160s to 180s -Continue nitro infusion.  Admit to the progressive care unit and monitor blood pressure very closely.  Target blood pressure of <180/120 during the first hour, then <160/110 over the next 23 hours.  Intrahepatic cholangiocarcinoma with peritoneal metastasis: Followed by oncology and currently on chemotherapy.  Last oncology office visit was earlier today - plan is to continue gemcitabine every 2 weeks and follow-up in 2 weeks. -Please ensure oncology follow-up  Anemia: Followed by oncology and is felt to be secondary to chemo.  She has previously required blood transfusions.  Hemoglobin had dropped to 7 on labs done during oncology visit earlier today and plan was to give a 2 units PRBCs on 8/7.  Hemoglobin 7.9 on repeat labs done in the ED. -Will hold off giving blood transfusions at this time given concern for volume overload/ flash pulmonary edema.  Neuropathy: Likely secondary to chemotherapy. -Continue Cymbalta and gabapentin  CKD stage III: Creatinine 1.3, baseline. -Continue to monitor renal function  Non-insulin-dependent type 2 diabetes -Check A1c.  Sliding scale insulin sensitive ACHS and CBG checks.  DVT prophylaxis: Lovenox Code Status: Patient wishes to be full code. Family Communication: No family available at this time. Disposition Plan: Status is: Inpatient  Remains inpatient appropriate because:IV treatments appropriate  due to intensity of illness or inability to take PO and Inpatient level of care appropriate due to severity of illness   Dispo: The patient is from: Home              Anticipated d/c is to: Home              Anticipated d/c date is: 2 days              Patient currently is not medically stable to d/c.  The medical decision making on this patient was of high complexity and the patient is at high risk for clinical deterioration, therefore this is a level 3 visit.  Shela Leff MD [facility]  If 7PM-7AM, please contact night-coverage [email or web address]  10/18/2019, 11:48 PM

## 2019-10-18 NOTE — ED Notes (Signed)
IV Team paged for access of port-a-cath. 20G obtained in L. Hand for meantime. RT at bedside to place patient on BiPap. Providers at Bedside.

## 2019-10-18 NOTE — Patient Instructions (Signed)
[facility] Discharge Instructions for Patients Receiving Chemotherapy  Today you received the following chemotherapy agent: Gemcitabine (Gemzar)  To help prevent nausea and vomiting after your treatment, we encourage you to take your nausea medication as directed by your MD.   If you develop nausea and vomiting that is not controlled by your nausea medication, call the clinic.   BELOW ARE SYMPTOMS THAT SHOULD BE REPORTED IMMEDIATELY:  *FEVER GREATER THAN 100.5 F  *CHILLS WITH OR WITHOUT FEVER  NAUSEA AND VOMITING THAT IS NOT CONTROLLED WITH YOUR NAUSEA MEDICATION  *UNUSUAL SHORTNESS OF BREATH  *UNUSUAL BRUISING OR BLEEDING  TENDERNESS IN MOUTH AND THROAT WITH OR WITHOUT PRESENCE OF ULCERS  *URINARY PROBLEMS  *BOWEL PROBLEMS  UNUSUAL RASH Items with * indicate a potential emergency and should be followed up as soon as possible.  Feel free to call the clinic should you have any questions or concerns. The clinic phone number is [phone].  Please show the CHEMO ALERT CARD at check-in to the Emergency Department and triage nurse.   

## 2019-10-18 NOTE — ED Provider Notes (Signed)
[facility] EMERGENCY DEPARTMENT Provider Note   CSN: [id number] Arrival date & time: 10/18/19  1828     History Chief Complaint  Patient presents with  . Shortness of Breath  . Hypertension    Donna May is a 79 y.o. female.  Patient arrives via EMS after suddenly developing shortness of breath. She received her chemo infusion (Gemzar) today for liver cancer. She is tachypneic, hypoxic, and hypertensive. Increased work of breathing. No skin rash or tongue swelling. Rales noted in all lung fields. No history of cardiac disease or CHF.  The history is provided by the patient.  Shortness of Breath Severity:  Severe Onset quality:  Sudden Duration:  1 hour Timing:  Constant Progression:  Worsening Chronicity:  New Ineffective treatments:  Oxygen and sitting up Associated symptoms: no abdominal pain, no chest pain, no cough, no fever, no rash and no sore throat   Risk factors: hx of cancer   Hypertension Associated symptoms include shortness of breath. Pertinent negatives include no chest pain and no abdominal pain.       Past Medical History:  Diagnosis Date  . Anemia   . Anxiety   . Arthritis   . Breast cancer ([place]) 1992  . Cataract    Bilateral eyes - surgery to remove  . Chronic kidney disease    CKD stage 3  . Complication of anesthesia    "something they use make me itch for a couple of days."  . Depression   . Diabetes mellitus    type 2  . Duodenitis 01/18/2002  . Fainting spell   . Family history of breast cancer   . Family history of prostate cancer   . GERD (gastroesophageal reflux disease)   . Heart murmur    never has caused any problems  . Hiatal hernia 08/08/2008, 01/18/2002  . History of pneumonia    x 2  . Hyperlipidemia   . Hypertension   . Liver cancer ([place]) 08/2018  . Lymphedema 2017   Right arm  . Pneumonia    x 2  . UTI (lower urinary tract infection)     Patient Active Problem List   Diagnosis Date Noted  .  Chemotherapy-induced neuropathy ([place]) 08/28/2019  . Port-A-Cath in place 12/14/2018  . Goals of care, counseling/discussion 11/24/2018  . Status post laparoscopy 11/17/2018  . Genetic testing 11/07/2018  . Family history of prostate cancer   . Family history of breast cancer   . Intrahepatic cholangiocarcinoma ([place]) 09/20/2018  . Coronary artery calcification 04/20/2016  . Ductal carcinoma in situ (DCIS) of right breast 11/26/2015  . Lymphedema of arm 11/26/2015  . Lymphedema 11/01/2015  . Leg pain, bilateral 12/28/2013  . Special screening for malignant neoplasms, colon 08/09/2013  . Renal artery stenosis, native, bilateral ([place]) 06/09/2013  . Diabetes mellitus with renal complications ([place]) 62/37/6283  . RASH AND OTHER NONSPECIFIC SKIN ERUPTION 07/05/2008  . Malignant neoplasm of female breast ([place]) 07/04/2008  . HYPERCHOLESTEROLEMIA 07/04/2008  . Essential hypertension 07/04/2008  . GERD 07/04/2008  . DUODENITIS, WITHOUT HEMORRHAGE 07/04/2008  . HIATAL HERNIA 07/04/2008  . Arthropathy 07/04/2008    Past Surgical History:  Procedure Laterality Date  . AXILLARY SURGERY     cyst removal, right  . COLONOSCOPY  09/23/2018   Dr. Havery Moros - polyps  . EYE SURGERY Bilateral    cataracts to remove  . LAPAROSCOPY N/A 11/17/2018   Procedure: LAPAROSCOPY DIAGNOSTIC, INTRAOPERATIVE ULTRASOUND, PERITONEAL BIOPSIES;  Surgeon: [name], MD;  Location:  [facility] OR;  Service: General;  Laterality: N/A;  GENERAL AND EPIDURAL  . LIVER BIOPSY  08/2018   Dr. [place] Coke  . MASTECTOMY  1992   right, with flap  . NM MYOCAR PERF WALL MOTION  06/11/2009   Protocol:[place], post stress EF58%, EKG negative for ischemia, low risk  . PORTACATH PLACEMENT N/A 12/02/2018   Procedure: INSERTION PORT-A-CATH;  Surgeon: [name], MD;  Location: [facility];  Service: General;  Laterality: N/A;  . RECONSTRUCTION BREAST W/ TRAM FLAP Right   . TONSILLECTOMY    . TRANSTHORACIC ECHOCARDIOGRAM  12/24/2009   LVEF =>55%,  normal study  . UPPER GI ENDOSCOPY       OB History    Gravida      Para      Term      Preterm      AB      Living  2     SAB      TAB      Ectopic      Multiple      Live Births              Family History  Problem Relation Age of Onset  . Breast cancer Cousin        diagnosed >50; mother's first cousins  . Diabetes Brother   . Heart disease Mother   . Hypertension Mother   . Heart disease Father   . Hypertension Father   . Prostate cancer Brother 71  . Heart attack Maternal Grandmother   . Stroke Maternal Grandfather   . Colon cancer Neg Hx   . Esophageal cancer Neg Hx   . Stomach cancer Neg Hx   . Rectal cancer Neg Hx     Social History   Tobacco Use  . Smoking status: Former Smoker    Packs/day: 0.25    Years: 25.00    Pack years: 6.25    Types: Cigarettes    Quit date: 1990    Years since quitting: 31.6  . Smokeless tobacco: Never Used  Vaping Use  . Vaping Use: Never used  Substance Use Topics  . Alcohol use: Not Currently    Alcohol/week: 1.0 standard drink    Types: 1 Glasses of wine per week    Comment: occasional wine  . Drug use: No    Home Medications Prior to Admission medications   Medication Sig Start Date End Date Taking? Authorizing Provider  amLODipine (NORVASC) 10 MG tablet TAKE 1 TABLET BY MOUTH EVERY DAY 03/03/19   [name], Mihai, MD  aspirin 81 MG tablet Take 81 mg by mouth at bedtime.     [provider]  Calcium Carbonate-Vitamin D 600-200 MG-UNIT TABS Take 1 tablet by mouth daily.    [provider]  CALCIUM-MAGNESIUM-VITAMIN D PO Take 1 tablet by mouth once a week.     [provider]  Cholecalciferol (VITAMIN D3) 2000 UNITS TABS Take 1 tablet by mouth once a week.     [provider]  DULoxetine (CYMBALTA) 30 MG capsule Take 1 capsule (30 mg total) by mouth daily. Patient not taking: Reported on 10/04/2019 07/12/19   [name], MD  Echinacea-Goldenseal [facility]  COMB/GOLDEN SEAL) CAPS Take by mouth.    [provider]  glucose blood test strip 1 each by Other route 2 (two) times daily. Use Onetouch verio test strips as instructed to check blood sugar twice daily. 01/05/19   Elayne Snare, MD  hydrochlorothiazide (HYDRODIURIL) 25 MG tablet Take  1 tablet (25 mg total) by mouth daily. 03/06/19   [name], Mihai, MD  HYDROcodone-acetaminophen (NORCO/VICODIN) 5-325 MG tablet Take 1 tablet by mouth every 6 (six) hours as needed for moderate pain. 12/02/18   [name], MD  ketoconazole (NIZORAL) 2 % cream Apply 1 fingertip amount to each foot daily. 09/23/17   Evelina Bucy, DPM  lansoprazole (PREVACID) 15 MG capsule Take 1 capsule (15 mg total) by mouth daily. 06/08/19   [name], [name], MD  lidocaine-prilocaine (EMLA) cream Apply to affected area once 05/31/19   Alla Feeling, NP  loratadine (CLARITIN) 10 MG tablet Take 10 mg by mouth daily as needed for allergies.    [provider]  metFORMIN (GLUCOPHAGE-XR) 500 MG 24 hr tablet Take 1 tablet (500 mg total) by mouth 2 (two) times daily before a meal. 08/01/18   [name], MD  metoprolol succinate (TOPROL-XL) 100 MG 24 hr tablet Take 1 tablet (100 mg total) by mouth daily. Take with or immediately following a meal. 01/26/19   [name], Mihai, MD  Omega 3 1000 MG CAPS Take 2,000 mg by mouth daily.     [provider]  ondansetron (ZOFRAN) 8 MG tablet Take 1 tablet (8 mg total) by mouth every 8 (eight) hours as needed for nausea or vomiting. 11/28/18   [name], MD  prednisoLONE acetate (PRED FORTE) 1 % ophthalmic suspension  09/21/19   [provider]  Prenatal Vit-Fe Fumarate-FA (MULTIVITAMIN-PRENATAL) 27-0.8 MG TABS tablet Take 1 tablet by mouth daily at 12 noon.    [provider]  prochlorperazine (COMPAZINE) 10 MG tablet Take 1 tablet (10 mg total) by mouth every 6 (six) hours as needed for nausea or vomiting. 11/28/18   [name], MD  raloxifene (EVISTA)  60 MG tablet TAKE 1 TABLET DAILY 12/05/18   [name], MD  rosuvastatin (CRESTOR) 20 MG tablet Take 1 tablet (20 mg total) by mouth daily. 01/26/19   [name], Mihai, MD  sitaGLIPtin (JANUVIA) 100 MG tablet Take 1 tablet (100 mg total) by mouth daily. 01/20/19   Elayne Snare, MD  triamcinolone cream (KENALOG) 0.1 % Apply 1 application topically 2 (two) times daily. 09/06/17   Marletta Lor, MD  valsartan (DIOVAN) 320 MG tablet Take 1 tablet (320 mg total) by mouth daily. 03/06/19   [name], [name], MD    Allergies    Patient has no known allergies.  Review of Systems   Review of Systems  Constitutional: Negative for fever.  HENT: Negative for sore throat.   Respiratory: Positive for shortness of breath. Negative for cough.   Cardiovascular: Negative for chest pain.  Gastrointestinal: Negative for abdominal pain.  Skin: Negative for rash.  All other systems reviewed and are negative.   Physical Exam Updated Vital Signs BP (!) 224/95 (BP Location: Left Arm)   Pulse 95   Temp 99.4 F (37.4 C) (Oral)   Resp (!) 35   Ht 5\' 5"  (1.651 m)   Wt 59 kg   LMP  (LMP Unknown)   SpO2 98%   BMI 21.63 kg/m   Physical Exam Vitals and nursing note reviewed.  Constitutional:      General: She is in acute distress.     Appearance: She is ill-appearing.  HENT:     Head: Atraumatic.     Mouth/Throat:     Mouth: Mucous membranes are moist.  Eyes:     Conjunctiva/sclera: Conjunctivae normal.  Cardiovascular:     Rate and Rhythm: Tachycardia  present.  Pulmonary:     Effort: Tachypnea, accessory muscle usage and respiratory distress present.     Breath sounds: Examination of the right-upper field reveals rales. Examination of the left-upper field reveals rales. Examination of the right-middle field reveals rales. Examination of the left-middle field reveals rales. Examination of the right-lower field reveals rales. Examination of the left-lower field reveals rales. Rales present.    Abdominal:     Palpations: Abdomen is soft.  Musculoskeletal:     Cervical back: Neck supple.     Right lower leg: Edema present.     Left lower leg: Edema present.  Skin:    General: Skin is warm and dry.     Coloration: Skin is pale.  Neurological:     Mental Status: She is alert and oriented to person, place, and time.  Psychiatric:        Mood and Affect: Mood is anxious.     ED Results / Procedures / Treatments   Labs (all labs ordered are listed, but only abnormal results are displayed) Labs Reviewed - No data to display  EKG None  Radiology DG Chest Portable 1 View  Result Date: 10/18/2019 CLINICAL DATA:  Shortness of breath. EXAM: PORTABLE CHEST 1 VIEW COMPARISON:  December 02, 2018 FINDINGS: LEFT-sided Port-A-Cath terminates at distal aspect of the superior vena cava. Heart size is stable. Hilar structures are partially obscured by diffuse interstitial and airspace disease worse in the RIGHT chest and worse in the RIGHT lower lobe. No sign of pleural effusion. Near confluent disease partially obscures the RIGHT hemidiaphragm. RIGHT heart border is distinct. Visualized skeletal structures on limited assessment without acute process. IMPRESSION: 1. Diffuse interstitial and airspace disease worse in the RIGHT chest and worse in the RIGHT lower lobe. 2. Findings above could represent and are most suspicious for multifocal pneumonia. Asymmetric pulmonary edema and lymphangitic tumor are considered. Laboratory correlation may be helpful for any signs of heart failure with chest CT as warranted. Electronically Signed   By: Zetta Bills M.D.   On: 10/18/2019 19:12    Procedures Procedures (including critical care time)  Medications Ordered in ED Medications - No data to display  ED Course  I have reviewed the triage vital signs and the nursing notes.  Pertinent labs & imaging results that were available during my care of the patient were reviewed by me and considered in my  medical decision making (see chart for details).  Patient discussed with and seen by Dr. Maryan Rued. Portable chest xray results reviewed, appears to be pulmonary edema. Patient started on BiPap by RT with improvement in work of breathing and oxygen saturations.     7:55 PM Patient continues on BiPap. Breathing easily. Remains hypertensive. Will initiate nitro drip at 2.5 mcg/min  9:51 PM Diffuse crackles noted upon auscultation. Blood pressure improved. Weaned off of bipap, currently on 2 LPM via nasal cannula.   Patient remains on NTG drip for blood pressure control. Admission requested.  Spoke with Ninetta Lights with hospitalist service. Will see patient.  MDM Rules/Calculators/A&P                           Final Clinical Impression(s) / ED Diagnoses Final diagnoses:  Acute pulmonary edema [facility])    Rx / DC Orders ED Discharge Orders    None       Etta Quill, NP 10/18/19 2259    Blanchie Dessert, MD 10/18/19 2321

## 2019-10-18 NOTE — ED Triage Notes (Signed)
Pt BIB [facility] from home with SOB. Pt had a sudden onset of SOB at home at rest.   Upon EMS arrival, the pt was hypoxic on RA in the mid 80s. Pt was placed on NRB 15L and was saturating at 95%.  Pt is alert and oriented x4, GCS 15.  No Home Oxygen Normally, Hx of Liver Cancer (Chemo infusion completed today). Hx of Diabetes and HTN.  Pt hypertensive with EMS (311E systolic). All other VSS.

## 2019-10-18 NOTE — Progress Notes (Signed)
RT took pt off BiPAP and placed pt on 2L [place]. Pts WOB has decreased/improved and breath sounds have improved. Nurse was in the room when pt was taken off BiPAP. RT will continue to monitor.

## 2019-10-19 ENCOUNTER — Inpatient Hospital Stay (HOSPITAL_COMMUNITY): Payer: Medicare Other

## 2019-10-19 ENCOUNTER — Telehealth: Payer: Self-pay | Admitting: Hematology

## 2019-10-19 ENCOUNTER — Encounter (HOSPITAL_COMMUNITY): Payer: Self-pay | Admitting: Internal Medicine

## 2019-10-19 DIAGNOSIS — J9601 Acute respiratory failure with hypoxia: Secondary | ICD-10-CM

## 2019-10-19 DIAGNOSIS — I361 Nonrheumatic tricuspid (valve) insufficiency: Secondary | ICD-10-CM

## 2019-10-19 LAB — HEMOGLOBIN AND HEMATOCRIT, BLOOD
HCT: 25.1 % — ABNORMAL LOW (ref 36.0–46.0)
Hemoglobin: 8.2 g/dL — ABNORMAL LOW (ref 12.0–15.0)

## 2019-10-19 LAB — BASIC METABOLIC PANEL
Anion gap: 12 (ref 5–15)
Anion gap: 12 (ref 5–15)
BUN: 29 mg/dL — ABNORMAL HIGH (ref 8–23)
BUN: 30 mg/dL — ABNORMAL HIGH (ref 8–23)
CO2: 23 mmol/L (ref 22–32)
CO2: 23 mmol/L (ref 22–32)
Calcium: 8.1 mg/dL — ABNORMAL LOW (ref 8.9–10.3)
Calcium: 8.1 mg/dL — ABNORMAL LOW (ref 8.9–10.3)
Chloride: 105 mmol/L (ref 98–111)
Chloride: 105 mmol/L (ref 98–111)
Creatinine, Ser: 1.33 mg/dL — ABNORMAL HIGH (ref 0.44–1.00)
Creatinine, Ser: 1.31 mg/dL — ABNORMAL HIGH (ref 0.44–1.00)
GFR calc Af Amer: 44 mL/min — ABNORMAL LOW (ref >60)
GFR calc Af Amer: 44 mL/min — ABNORMAL LOW (ref >60)
GFR calc non Af Amer: 38 mL/min — ABNORMAL LOW (ref >60)
GFR calc non Af Amer: 38 mL/min — ABNORMAL LOW (ref >60)
Glucose, Bld: 136 mg/dL — ABNORMAL HIGH (ref 70–99)
Glucose, Bld: 108 mg/dL — ABNORMAL HIGH (ref 70–99)
Potassium: 4 mmol/L (ref 3.5–5.1)
Potassium: 4.1 mmol/L (ref 3.5–5.1)
Sodium: 140 mmol/L (ref 135–145)
Sodium: 140 mmol/L (ref 135–145)

## 2019-10-19 LAB — MRSA PCR SCREENING: MRSA by PCR: NEGATIVE

## 2019-10-19 LAB — CBC
HCT: 20.4 % — ABNORMAL LOW (ref 36.0–46.0)
Hemoglobin: 6.3 g/dL — CL (ref 12.0–15.0)
MCH: 31 pg (ref 26.0–34.0)
MCHC: 30.9 g/dL (ref 30.0–36.0)
MCV: 100.5 fL — ABNORMAL HIGH (ref 80.0–100.0)
Platelets: 331 10*3/uL (ref 150–400)
RBC: 2.03 MIL/uL — ABNORMAL LOW (ref 3.87–5.11)
RDW: 18.1 % — ABNORMAL HIGH (ref 11.5–15.5)
WBC: 10.7 10*3/uL — ABNORMAL HIGH (ref 4.0–10.5)
nRBC: 0 % (ref 0.0–0.2)

## 2019-10-19 LAB — TROPONIN I (HIGH SENSITIVITY)
Troponin I (High Sensitivity): 74 ng/L — ABNORMAL HIGH (ref <18)
Troponin I (High Sensitivity): 55 ng/L — ABNORMAL HIGH (ref <18)
Troponin I (High Sensitivity): 48 ng/L — ABNORMAL HIGH (ref <18)

## 2019-10-19 LAB — CBG MONITORING, ED
Glucose-Capillary: 109 mg/dL — ABNORMAL HIGH (ref 70–99)
Glucose-Capillary: 129 mg/dL — ABNORMAL HIGH (ref 70–99)
Glucose-Capillary: 98 mg/dL (ref 70–99)

## 2019-10-19 LAB — ECHOCARDIOGRAM COMPLETE
Area-P 1/2: 5.66 cm2
Calc EF: 50.8 %
Height: 65 in
S' Lateral: 3.2 cm
Single Plane A2C EF: 59.5 %
Single Plane A4C EF: 41.4 %
Weight: 2080 oz

## 2019-10-19 LAB — BRAIN NATRIURETIC PEPTIDE: B Natriuretic Peptide: 2498.8 pg/mL — ABNORMAL HIGH (ref 0.0–100.0)

## 2019-10-19 LAB — GLUCOSE, CAPILLARY
Glucose-Capillary: 101 mg/dL — ABNORMAL HIGH (ref 70–99)
Glucose-Capillary: 99 mg/dL (ref 70–99)

## 2019-10-19 LAB — PROCALCITONIN: Procalcitonin: 13.94 ng/mL

## 2019-10-19 LAB — HEMOGLOBIN A1C
Hgb A1c MFr Bld: 5.2 % (ref 4.8–5.6)
Mean Plasma Glucose: 102.54 mg/dL

## 2019-10-19 LAB — LACTIC ACID, PLASMA: Lactic Acid, Venous: 1.2 mmol/L (ref 0.5–1.9)

## 2019-10-19 LAB — PREPARE RBC (CROSSMATCH)

## 2019-10-19 MED ORDER — ENOXAPARIN SODIUM 40 MG/0.4ML ~~LOC~~ SOLN
40.0000 mg | SUBCUTANEOUS | Status: DC
  Administered 2019-10-19 – 2019-10-21 (×4): 40 mg via SUBCUTANEOUS
  Filled 2019-10-19 (×5): qty 0.4

## 2019-10-19 MED ORDER — CHLORHEXIDINE GLUCONATE 0.12 % MT SOLN
15.0000 mL | Freq: Two times a day (BID) | OROMUCOSAL | Status: DC
  Administered 2019-10-20 – 2019-10-22 (×5): 15 mL via OROMUCOSAL
  Filled 2019-10-19 (×6): qty 15

## 2019-10-19 MED ORDER — HYDRALAZINE HCL 20 MG/ML IJ SOLN: 10.0000 mg | INTRAMUSCULAR | Status: DC | PRN

## 2019-10-19 MED ORDER — FUROSEMIDE 10 MG/ML IJ SOLN
40.0000 mg | Freq: Once | INTRAMUSCULAR | Status: AC
  Administered 2019-10-19: 40 mg via INTRAVENOUS
  Filled 2019-10-19: qty 4

## 2019-10-19 MED ORDER — HYDRALAZINE HCL 20 MG/ML IJ SOLN
5.0000 mg | INTRAMUSCULAR | Status: DC | PRN
  Administered 2019-10-19: 5 mg via INTRAVENOUS
  Filled 2019-10-19: qty 1

## 2019-10-19 MED ORDER — IRBESARTAN 300 MG PO TABS
300.0000 mg | ORAL_TABLET | Freq: Every day | ORAL | Status: DC
  Administered 2019-10-19 – 2019-10-22 (×4): 300 mg via ORAL
  Filled 2019-10-19 (×5): qty 1

## 2019-10-19 MED ORDER — METOPROLOL TARTRATE 5 MG/5ML IV SOLN
5.0000 mg | INTRAVENOUS | Status: DC | PRN
  Administered 2019-10-20: 5 mg via INTRAVENOUS
  Filled 2019-10-19: qty 5

## 2019-10-19 MED ORDER — AMLODIPINE BESYLATE 10 MG PO TABS
10.0000 mg | ORAL_TABLET | Freq: Every day | ORAL | Status: DC
  Administered 2019-10-19 – 2019-10-22 (×4): 10 mg via ORAL
  Filled 2019-10-19: qty 2
  Filled 2019-10-19 (×3): qty 1

## 2019-10-19 MED ORDER — FUROSEMIDE 10 MG/ML IJ SOLN
40.0000 mg | Freq: Once | INTRAMUSCULAR | Status: AC
  Administered 2019-10-20: 40 mg via INTRAVENOUS
  Filled 2019-10-19: qty 4

## 2019-10-19 MED ORDER — METOPROLOL SUCCINATE ER 100 MG PO TB24
100.0000 mg | ORAL_TABLET | Freq: Every day | ORAL | Status: DC
  Administered 2019-10-19 – 2019-10-22 (×4): 100 mg via ORAL
  Filled 2019-10-19 (×4): qty 1

## 2019-10-19 MED ORDER — SODIUM CHLORIDE 0.9% IV SOLUTION: Freq: Once | INTRAVENOUS | Status: AC

## 2019-10-19 MED ORDER — HYDROCHLOROTHIAZIDE 25 MG PO TABS
25.0000 mg | ORAL_TABLET | Freq: Every day | ORAL | Status: DC
  Administered 2019-10-19 – 2019-10-21 (×3): 25 mg via ORAL
  Filled 2019-10-19 (×3): qty 1

## 2019-10-19 MED ORDER — ORAL CARE MOUTH RINSE
15.0000 mL | Freq: Two times a day (BID) | OROMUCOSAL | Status: DC
  Administered 2019-10-20 – 2019-10-21 (×4): 15 mL via OROMUCOSAL

## 2019-10-19 MED ORDER — LINAGLIPTIN 5 MG PO TABS
5.0000 mg | ORAL_TABLET | Freq: Every day | ORAL | Status: DC
  Administered 2019-10-20 – 2019-10-22 (×3): 5 mg via ORAL
  Filled 2019-10-19 (×5): qty 1

## 2019-10-19 NOTE — ED Notes (Signed)
Lunch Tray Ordered @ 1049. °

## 2019-10-19 NOTE — ED Notes (Signed)
Breakfast ordered 

## 2019-10-19 NOTE — Telephone Encounter (Signed)
Scheduled per 8/4 los. Noted to give pt appt calendar on next visit.

## 2019-10-19 NOTE — Progress Notes (Addendum)
Floor coverage  Patient admitted yesterday evening for acute hypoxic respiratory failure secondary to multifocal pneumonia and acute CHF/flash pulmonary edema from hypertensive emergency.  She is receiving antibiotics for pneumonia.  Initially required nitro infusion for hypertensive emergency but was transitioned to home p.o. meds this morning.  Paged by nursing staff due to concern for respiratory distress.  Patient complained of shortness of breath.  She was previously satting in the upper 90s on 2 L supplemental oxygen but now desatting to 90%.  Oxygen increased to 4 L and now satting 96%.  Tachypneic with respiratory rate in the 30s.  Systolic above 737.  Patient was seen and examined at bedside.  Sitting at bedside commode.  Reports having shortness of breath but does not think it has changed significantly since the time of admission.  Patient initially stated she had some left-sided chest discomfort but later denied chest pain/discomfort. Satting 100% on 4 L supplemental oxygen.  Tachypneic with respiratory rate up to mid 30s.  Able to speak clearly in full sentences.  Heart rate in the 80s to 90s.  Rales appreciated at the bases upon auscultation of the lungs.  No wheezing.  +JVD. +1 pedal edema bilaterally.  Systolic improved to 366K after IV hydralazine 5 mg.   Plan -Give IV Lasix 40 mg -IV hydralazine PRN -IV Lopressor PRN -Stat chest x-ray -Stat ABG -Stat BNP -Stat EKG -Stat troponin -Continuous pulse ox, continue supplemental oxygen to keep oxygen saturation above 92%  Addendum: -Also check Stat d-dimer, if elevated order CTA to rule out PE given history of maligancy  Addendum 1:44 AM: Blood pressure now improved after PRN meds.  Respiratory rate improved and currently satting 98% on 2 L supplemental oxygen after IV Lasix.  Chest x-ray showing slightly improved aeration at the right base.  No significant interval change in bilateral groundglass opacities and consolidations.   Similar cardiomegaly with vascular congestion. EKG without acute ischemic changes. ABG with pH 7.43, PCO2 35.2, and PO2 82.9. Labs pending.

## 2019-10-19 NOTE — Progress Notes (Signed)
*   Echocardiogram 2D Echocardiogram has been performed.  Darlina Sicilian M 10/19/2019, 11:26 AM

## 2019-10-19 NOTE — Progress Notes (Signed)
PROGRESS NOTE    Donna May  SNK:[id number] DOB: June 16, 1940 DOA: 10/18/2019 PCP: [name], MD      Brief Narrative:  Donna May is a 79 y.o. 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  In the ER patient was hypoxic to the 80s. Chest x-ray showed multifocal pneumonia, blood pressure was 937 systolic.  She was admitted, started on antibiotics, diuretics, nitro, and supplemental oxygen.           Assessment & Plan:  Acute hypoxic respiratory failure Multifactorial, due to multifocal pneumonia, as well as congestive heart failure due to hypertensive emergency with flash pulmonary edema.  Improved rapidly overnight with Lasix and nitro, stable now on 2 L supplemental oxygen by nasal cannula   Multifocal pneumonia, left upper, lower lobes Procalcitonin 13, bacterial infection very likely.  Unfortunately, no blood cultures obtained in ER -Continue ceftriaxone and azithromycin -Obtain blood cultures  Hypertensive emergency leading to acute congestive heart failure and flash pulmonary edema BP controlled in 150s overnight on nitro infusion and Lasix -Wean nitro drip -Resume amlodipine, ARB, metoprolol and HCTZ -Obtain echo -Repeat IV Lasix -Strict I's and O's -Monitor renal function, potassium  Diabetes This well controlled -Continue aspirin -Continue SS corrections -Continue sitagliptin as formulary alternative  CKD IIIa  Cr stable relative to baseline  Anemia Anemia due to chemotherapy.  Hemoglobin down to 6.3 this morning -Transfuse 1 unit -Check iron studies       Disposition: Status is: Inpatient  Remains inpatient appropriate because:IV treatments appropriate due to intensity of illness or inability to take PO   Dispo: The patient is from: Home              Anticipated d/c is to:  Home              Anticipated d/c date is: 3 days              Patient currently is not medically stable to d/c.              MDM: The below labs and imaging reports were reviewed and summarized above.  Medication management as above.     DVT prophylaxis: enoxaparin (LOVENOX) injection 40 mg Start: 10/19/19 1000  Code Status: Full code Family Communication:     Consultants:     Procedures:   Echocardiogram pending  Antimicrobials:   Ceftriaxone and azithromycin 8/4>>  Culture data:   8/5 Blood cultures x2 pending          Subjective: Patient feeling considerably better overnight.  Her breathing is better.  She has no chest pain, headache, tachypnea, abdominal pain, nausea.  She has some chronic peripheral neuropathy in hands and feet.  Objective: Vitals:   10/19/19 0615 10/19/19 0620 10/19/19 0621 10/19/19 0700  BP: 140/73   (!) 149/75  Pulse: 65 64 74 64  Resp: 15 15 17 16   Temp:      TempSrc:      SpO2: 100% 100% 100% 100%  Weight:      Height:        Intake/Output Summary (Last 24 hours) at 10/19/2019 0852 Last data filed at 10/19/2019 0100 Gross per 24 hour  Intake 100 ml  Output --  Net 100 ml   Filed Weights   10/18/19 1834  Weight: 59 kg    Examination: General appearance:  adult female, alert and in no acute distress.  In bed, eating breakfast HEENT: Anicteric, conjunctiva pink, lids and lashes normal. No nasal deformity, discharge, epistaxis.  Lips moist, dentition in good repair, oropharynx moist, no oral lesions, hearing normal.   Skin: Warm and dry.  No jaundice.  No suspicious rashes or lesions. Cardiac: RRR, nl S1-S2, no murmurs appreciated.  Capillary refill is brisk.  JVP normal.  No JVD.  Trace LE edema.  Radial pulses 2+ and symmetric. Respiratory: Normal respiratory rate and rhythm.  Rales at bilateral bases, no wheezing.  Nasal cannula in place Abdomen: Abdomen soft.  No TTP or guarding. No ascites, distension,  hepatosplenomegaly.   MSK: No deformities or effusions.  Normal muscle bulk and tone Neuro: Awake and alert.  EOMI, moves all extremities with normal strength and coordination.  Speech fluent.    Psych: Sensorium intact and responding to questions, attention normal. Affect normal.  Judgment and insight appear normal.    Data Reviewed: I have personally reviewed following labs and imaging studies:  CBC: Recent Labs  Lab 10/18/19 0830 10/18/19 1944 10/19/19 0620  WBC 9.3 19.1* 10.7*  NEUTROABS 7.1 17.6*  --   HGB 7.0* 7.9* 6.3*  HCT 22.4* 25.7* 20.4*  MCV 97.4 99.6 100.5*  PLT 297 379 250   Basic Metabolic Panel: Recent Labs  Lab 10/18/19 0830 10/18/19 1944 10/19/19 0536  NA 141 136 140  K 3.8 4.0 4.0  CL 110 105 105  CO2 22 21* 23  GLUCOSE 147* 172* 136*  BUN 27* 26* 29*  CREATININE 1.33* 1.32* 1.33*  CALCIUM 8.8* 8.1* 8.1*  MG 2.4  --   --    GFR: Estimated Creatinine Clearance: 30.4 mL/min (A) (by C-G formula based on SCr of 1.33 mg/dL (H)). Liver Function Tests: Recent Labs  Lab 10/18/19 0830 10/18/19 1944  AST 19 27  ALT 7 11  ALKPHOS 68 53  BILITOT 0.5 0.9  PROT 6.8 7.8  ALBUMIN 2.8* 3.1*   No results for input(s): LIPASE, AMYLASE in the last 168 hours. No results for input(s): AMMONIA in the last 168 hours. Coagulation Profile: No results for input(s): INR, PROTIME in the last 168 hours. Cardiac Enzymes: No results for input(s): CKTOTAL, CKMB, CKMBINDEX, TROPONINI in the last 168 hours. BNP (last 3 results) No results for input(s): PROBNP in the last 8760 hours. HbA1C: Recent Labs    10/19/19 0019  HGBA1C 5.2   CBG: Recent Labs  Lab 10/19/19 0026 10/19/19 0729  GLUCAP 129* 98   Lipid Profile: No results for input(s): CHOL, HDL, LDLCALC, TRIG, CHOLHDL, LDLDIRECT in the last 72 hours. Thyroid Function Tests: No results for input(s): TSH, T4TOTAL, FREET4, T3FREE, THYROIDAB in the last 72 hours. Anemia Panel: No results for input(s):  VITAMINB12, FOLATE, FERRITIN, TIBC, IRON, RETICCTPCT in the last 72 hours. Urine analysis: No results found for: COLORURINE, APPEARANCEUR, LABSPEC, PHURINE, GLUCOSEU, HGBUR, BILIRUBINUR, KETONESUR, PROTEINUR, UROBILINOGEN, NITRITE, LEUKOCYTESUR Sepsis Labs: @LABRCNTIP (procalcitonin:4,lacticacidven:4)  ) Recent Results (from the past 240 hour(s))  SARS Coronavirus 2 by RT PCR (hospital order, performed in [facility] hospital lab) Nasopharyngeal Nasopharyngeal Swab     Status: None   Collection Time: 10/18/19  8:24 PM   Specimen: Nasopharyngeal Swab  Result Value Ref Range Status   SARS Coronavirus 2 NEGATIVE NEGATIVE Final    Comment: (NOTE) SARS-CoV-2 target nucleic acids are NOT DETECTED.  The SARS-CoV-2 RNA is generally detectable in upper and lower respiratory specimens during the acute phase of infection. The lowest concentration  of SARS-CoV-2 viral copies this assay can detect is 250 copies / mL. A negative result does not preclude SARS-CoV-2 infection and should not be used as the sole basis for treatment or other patient management decisions.  A negative result may occur with improper specimen collection / handling, submission of specimen other than nasopharyngeal swab, presence of viral mutation(s) within the areas targeted by this assay, and inadequate number of viral copies (<250 copies / mL). A negative result must be combined with clinical observations, patient history, and epidemiological information.  Fact Sheet for Patients:   StrictlyIdeas.no  Fact Sheet for Healthcare Providers: BankingDealers.co.za  This test is not yet approved or  cleared by the [place] FDA and has been authorized for detection and/or diagnosis of SARS-CoV-2 by FDA under an Emergency Use Authorization (EUA).  This EUA will remain in effect (meaning this test can be used) for the duration of the COVID-19 declaration under Section 564(b)(1)  of the Act, 21 U.S.C. section 360bbb-3(b)(1), unless the authorization is terminated or revoked sooner.  Performed at [facility], [place] [address], [place], [place] [address]          Radiology Studies: DG Chest Portable 1 View  Result Date: 10/18/2019 CLINICAL DATA:  Shortness of breath. EXAM: PORTABLE CHEST 1 VIEW COMPARISON:  December 02, 2018 FINDINGS: LEFT-sided Port-A-Cath terminates at distal aspect of the superior vena cava. Heart size is stable. Hilar structures are partially obscured by diffuse interstitial and airspace disease worse in the RIGHT chest and worse in the RIGHT lower lobe. No sign of pleural effusion. Near confluent disease partially obscures the RIGHT hemidiaphragm. RIGHT heart border is distinct. Visualized skeletal structures on limited assessment without acute process. IMPRESSION: 1. Diffuse interstitial and airspace disease worse in the RIGHT chest and worse in the RIGHT lower lobe. 2. Findings above could represent and are most suspicious for multifocal pneumonia. Asymmetric pulmonary edema and lymphangitic tumor are considered. Laboratory correlation may be helpful for any signs of heart failure with chest CT as warranted. Electronically Signed   By: Zetta Bills M.D.   On: 10/18/2019 19:12        Scheduled Meds: . amLODipine  10 mg Oral Daily  . aspirin  81 mg Oral Daily  . Chlorhexidine Gluconate Cloth  6 each Topical Daily  . enoxaparin (LOVENOX) injection  40 mg Subcutaneous Q24H  . furosemide  40 mg Intravenous Once  . hydrochlorothiazide  25 mg Oral Daily  . insulin aspart  0-5 Units Subcutaneous QHS  . insulin aspart  0-9 Units Subcutaneous TID WC  . irbesartan  300 mg Oral Daily  . linagliptin  5 mg Oral Daily  . metoprolol succinate  100 mg Oral Daily  . pantoprazole  20 mg Oral Daily  . sodium chloride flush  10-40 mL Intracatheter Q12H  . sodium chloride flush  3 mL Intravenous Q12H  . triamcinolone acetonide  10 mg Other Once    Continuous Infusions: . sodium chloride    . azithromycin Stopped (10/19/19 0455)  . cefTRIAXone (ROCEPHIN)  IV Stopped (10/19/19 0100)  . nitroGLYCERIN 5 mcg/min (10/19/19 0730)     LOS: 1 day    Time spent: 35 minutes    Edwin Dada, MD [facility] 10/19/2019, 8:52 AM     Please page though [place] or Epic secure chat:  For [employer], Adult nurse

## 2019-10-19 NOTE — Progress Notes (Addendum)
HEMATOLOGY-ONCOLOGY PROGRESS NOTE  SUBJECTIVE: The patient was seen in our office yesterday and was feeling well.  She received her scheduled gemcitabine without any issues.  She developed shortness of breath last evening and was brought to the hospital via EMS.  She was hypoxic to the mid 80s on room air and was initially started on O2 at 15 L via nonrebreather.  She was hypertensive with a systolic blood pressure in the 200s.  Chest x-ray showed diffuse interstitial and airspace disease worse in the right chest/right lower lobe and findings were suspicious for multifocal pneumonia versus asymmetric pulmonary edema versus lymphangitic tumor.  She received a dose of IV Lasix and was started on nitroglycerin infusion.  When I saw her today in the emergency room, nitroglycerin had been stopped.  She reported that her breathing was better.  She currently has a unit of blood infusing.  Hemoglobin down to 6.3 today.  She is currently afebrile.  Denies chest pain, abdominal pain, nausea, vomiting.  No bleeding reported.  Oncology History  Malignant neoplasm of female breast ([place])  11/06/2018 Genetic Testing   Negative genetic testing on the [facility] Common Hereditary Cancers panel. A variant of uncertain significance was identified in one of her APC genes, called c.1243G>A (p.Ala415Thr).  The Common Hereditary Cancers Panel offered by [facility] includes sequencing and/or deletion duplication testing of the following 48 genes: APC, ATM, AXIN2, BARD1, BMPR1A, BRCA1, BRCA2, BRIP1, CDH1, CDK4, CDKN2A (p14ARF), CDKN2A (p16INK4a), CHEK2, CTNNA1, DICER1, EPCAM (Deletion/duplication testing only), GREM1 (promoter region deletion/duplication testing only), KIT, MEN1, MLH1, MSH2, MSH3, MSH6, MUTYH, NBN, NF1, NHTL1, PALB2, PDGFRA, PMS2, POLD1, POLE, PTEN, RAD50, RAD51C, RAD51D, RNF43, SDHB, SDHC, SDHD, SMAD4, SMARCA4. STK11, TP53, TSC1, TSC2, and VHL.  The following genes were evaluated for sequence changes only: SDHA and  HOXB13 c.251G>A variant only.    Intrahepatic cholangiocarcinoma ([place])  09/07/2018 Imaging   CT Chest IMPRESSION: 1. New, enhancing mass involving segment 4 of the liver and fundus of gallbladder is concerning for malignancy. This may represent either metastatic disease from breast cancer or neoplasm primary to the liver or hepatic biliary tree. Further evaluation with contrast enhanced CT of the abdomen and pelvis is recommended. 2. No findings to suggest metastatic disease within the chest. 3.  Aortic Atherosclerosis (ICD10-I70.0). 4. Coronary artery calcifications.   09/13/2018 Pathology Results   Diagnosis Liver, needle/core biopsy - ADENOCARCINOMA. Microscopic Comment Immunohistochemistry for CK7 is positive. CK20, TTF1, CDX-2, GATA-3, PAX 8, Qualitative ER, p63 and CK5/6 are negative. The provided clinical history of remote mammary carcinoma is noted. Based on the morphology and immunophenotype of the adenocarcinoma observed in this specimen, primary cholangiocarcinoma is favored. Clinical and radiologic correlation are  encouraged. Results reported to [employer] on 09/15/2018. Intradepartmental consultation (Dr. Vic Ripper).   09/13/2018 Initial Diagnosis   Cholangiocarcinoma ([place])   09/23/2018 Procedure   Colonoscopy by Dr. Havery Moros 09/23/18  IMPRESSION - Two 3 to 4 mm polyps in the ascending colon, removed with a cold snare. Resected and retrieved. - Five 3 to 5 mm polyps in the transverse colon, removed with a cold snare. Resected and retrieved. - One 5 mm polyp at the splenic flexure, removed with a cold snare. Resected and retrieved. - Three 3 to 5 mm polyps in the sigmoid colon, removed with a cold snare. Resected and retrieved. - The examination was otherwise normal. Upper Endopscy by Dr. Havery Moros 09/23/18  IMPRESSION - Esophagogastric landmarks identified. - 2 cm hiatal hernia. - Normal esophagus otherwise. - A single gastric polyp.  Resected and retrieved. - Mild  gastritis. Biopsied. - Normal duodenal bulb and second portion of the duodenum.   09/23/2018 Pathology Results   Diagnosis 09/23/18 1. Surgical [P], duodenum - BENIGN SMALL BOWEL MUCOSA. - NO ACTIVE INFLAMMATION OR VILLOUS ATROPHY IDENTIFIED. 2. Surgical [P], stomach, polyp - HYPERPLASTIC POLYP(S). - THERE IS NO EVIDENCE OF MALIGNANCY. 3. Surgical [P], gastric antrum and gastric body - CHRONIC INACTIVE GASTRITIS. - THERE IS NO EVIDENCE OF HELICOBACTER-PYLORI, DYSPLASIA, OR MALIGNANCY. - SEE COMMENT. 4. Surgical [P], colon, sigmoid, splenic flexure, transverse and ascending, polyp (9) - TUBULAR ADENOMA(S). - SESSILE SERRATED POLYP WITHOUT CYTOLOGIC DYSPLASIA. - HIGH GRADE DYSPLASIA IS NOT IDENTIFIED. 5. Surgical [P], colon, sigmoid, polyp (2) - HYPERPLASTIC POLYP(S). - THERE IS NO EVIDENCE OF MALIGNANCY.   09/23/2018 Cancer Staging   Staging form: Intrahepatic Bile Duct, AJCC 8th Edition - Clinical stage from 09/23/2018: Stage IB (cT1b, cN0, cM0) - Signed by [name], MD on 10/06/2018   09/29/2018 PET scan   PET 09/29/18 IMPRESSION: 1. Hypermetabolic mass in the RIGHT hepatic lobe consistent with biopsy proven adenocarcinoma. No additional liver metastasis. 2. No evidence of local breast cancer recurrence in the RIGHT breast or RIGHT axilla. 3. Mild bilateral hypermetabolic adrenal glands is favored benign hyperplasia. 4. No evidence of additional metastatic disease on skull base to thigh FDG PET scan.   11/06/2018 Genetic Testing   Negative genetic testing on the [facility] Common Hereditary Cancers panel. A variant of uncertain significance was identified in one of her APC genes, called c.1243G>A (p.Ala415Thr).  The Common Hereditary Cancers Panel offered by [facility] includes sequencing and/or deletion duplication testing of the following 48 genes: APC, ATM, AXIN2, BARD1, BMPR1A, BRCA1, BRCA2, BRIP1, CDH1, CDK4, CDKN2A (p14ARF), CDKN2A (p16INK4a), CHEK2, CTNNA1, DICER1, EPCAM  (Deletion/duplication testing only), GREM1 (promoter region deletion/duplication testing only), KIT, MEN1, MLH1, MSH2, MSH3, MSH6, MUTYH, NBN, NF1, NHTL1, PALB2, PDGFRA, PMS2, POLD1, POLE, PTEN, RAD50, RAD51C, RAD51D, RNF43, SDHB, SDHC, SDHD, SMAD4, SMARCA4. STK11, TP53, TSC1, TSC2, and VHL.  The following genes were evaluated for sequence changes only: SDHA and HOXB13 c.251G>A variant only.    11/17/2018 Pathology Results   Diagnosis 11/17/18 1. Soft tissue, biopsy, Diaphragmatic nodules - METASTATIC ADENOCARCINOMA, CONSISTENT WITH PATIENT'S CLINICAL HISTORY OF CHOLANGIOCARCINOMA. SEE NOTE 2. Liver, biopsy, Left - LIVER PARENCHYMA WITH A BENIGN FIBROTIC NODULE - NO EVIDENCE OF MALIGNANCY 3. Stomach, biopsy - BENIGN PAPILLARY MESOTHELIAL HYPERPLASIA - NO EVIDENCE OF MALIGNANCY   11/29/2018 Imaging   CT CAP WO Contrast  IMPRESSION: 1. Dominant liver mass appears grossly stable from 09/29/2018. Additional liver lesions are too small to characterize but were not shown to be hypermetabolic on PET. 2. Mild nodularity of both adrenal glands with associated hypermetabolism on 09/29/2018. Continued attention on follow-up exams is warranted. 3. Small right lower lobe nodules, stable from 09/07/2018. Again, attention on follow-up is recommended. 4. Trace bilateral pleural fluid. 5. Aortic atherosclerosis (ICD10-170.0). Coronary artery calcification. 6. Enlarged pulmonic trunk, indicative of pulmonary arterial hypertension.     11/30/2018 - 06/14/2019 Chemotherapy   First line chemo Oxaliplatin and gemcitabine q2weeks starting 11/30/18. Stopped Oxaliplatin on 05/03/19 due to worsening neuropathy. Added Cisplatin with C13 on 05/17/19 and stopped on C15 06/14/19 due to neuropathy. Reduced to maintenance single agent Gemcitabine on 06/28/19.    01/20/2019 Imaging   CT CAP IMPRESSION: restaging  1. Mild interval increase in size of dominant liver mass involving segment 4 and segment 5. 2. No significant or  progressive adrenal nodularity identified to suggest metastatic disease. 3.  Unchanged appearance of small right lower lobe and lingular lung nodules, nonspecific.   03/21/2019 PET scan   IMPRESSION: 1. Large right hepatic mass with SUV uptake near background hepatic activity, dramatic response to therapy, also with decrease in size when compared to the prior study. 2. Signs of prior right mastectomy and axillary dissection as before. 3. Left adrenal activity in no longer above the level of activity seen in the contralateral, right adrenal gland or in the liver. Attention on follow-up. 4. No new signs of disease.   06/27/2019 PET scan   IMPRESSION: 1. Further decrease in size of a dominant hepatic mass which is non FDG avid. 2. No evidence of metastatic disease. 3. No evidence of left adrenal hypermetabolism or mass. 4. Incidental findings, including: Uterine fibroids. Coronary artery atherosclerosis. Aortic Atherosclerosis (ICD10-I70.0). Pulmonary artery enlargement suggests pulmonary arterial hypertension.   06/28/2019 -  Chemotherapy   Maintenance single agent Gemcitabine q2weeks  starting on 06/28/19 with C16.    09/15/2019 PET scan   IMPRESSION: 1. In the region of regional tumor at the junction of the right and left hepatic lobe, there is continued hypodensity and overall activity slightly less than that of the surrounding normal liver, indicating effective response to therapy. No current worrisome hypermetabolic lesion is identified in the liver or elsewhere. 2. Chronic asymmetric edema in the subcutaneous tissues of the right upper extremity, nonspecific. The patient has had a prior right mastectomy and right axillary dissection. 3. Other imaging findings of potential clinical significance: Aortic Atherosclerosis (ICD10-I70.0). Coronary atherosclerosis. Mild cardiomegaly. Small right and trace left pleural effusions. Subcutaneous and mild mesenteric edema. Suspected uterine  fibroids.      REVIEW OF SYSTEMS:   Constitutional: Denies fevers, chills  Eyes: Denies blurriness of vision Ears, nose, mouth, throat, and face: Denies mucositis or sore throat Respiratory: Reports improved shortness of breath Cardiovascular: Denies palpitation, chest discomfort Gastrointestinal:  Denies nausea, heartburn or change in bowel habits Skin: Denies abnormal skin rashes Lymphatics: Denies new lymphadenopathy or easy bruising Neurological:Denies numbness, tingling or new weaknesses Behavioral/Psych: Mood is stable, no new changes  Extremities: No lower extremity edema All other systems were reviewed with the patient and are negative.  I have reviewed the past medical history, past surgical history, social history and family history with the patient and they are unchanged from previous note.   PHYSICAL EXAMINATION: ECOG PERFORMANCE STATUS: 2 - Symptomatic, <50% confined to bed  Vitals:   10/19/19 1400 10/19/19 1500  BP: (!) 141/65 (!) 156/71  Pulse: 66 77  Resp: (!) 30 10  Temp:    SpO2: 99% 97%   Filed Weights   10/18/19 1834  Weight: 59 kg    Intake/Output from previous day: 08/04 0701 - 08/05 0700 In: 100 [IV Piggyback:100] Out: -   GENERAL: Awake and alert, appears comfortable OROPHARYNX:no exudate, no erythema and lips, buccal mucosa, and tongue normal  LUNGS: Rales to bilateral bases HEART: regular rate & rhythm and no murmurs and trace extremity edema ABDOMEN:abdomen soft, non-tender and normal bowel sounds Musculoskeletal:no cyanosis of digits and no clubbing  NEURO: alert & oriented x 3 with fluent speech, no focal motor/sensory deficits  LABORATORY DATA:  I have reviewed the data as listed CMP Latest Ref Rng & Units 10/19/2019 10/19/2019 10/18/2019  Glucose 70 - 99 mg/dL 108(H) 136(H) 172(H)  BUN 8 - 23 mg/dL 30(H) 29(H) 26(H)  Creatinine 0.44 - 1.00 mg/dL 1.31(H) 1.33(H) 1.32(H)  Sodium 135 - 145 mmol/L 140 140 136  Potassium  3.5 - 5.1 mmol/L  4.1 4.0 4.0  Chloride 98 - 111 mmol/L 105 105 105  CO2 22 - 32 mmol/L 23 23 21(L)  Calcium 8.9 - 10.3 mg/dL 8.1(L) 8.1(L) 8.1(L)  Total Protein 6.5 - 8.1 g/dL - - 7.8  Total Bilirubin 0.3 - 1.2 mg/dL - - 0.9  Alkaline Phos 38 - 126 U/L - - 53  AST 15 - 41 U/L - - 27  ALT 0 - 44 U/L - - 11    Lab Results  Component Value Date   WBC 10.7 (H) 10/19/2019   HGB 6.3 (LL) 10/19/2019   HCT 20.4 (L) 10/19/2019   MCV 100.5 (H) 10/19/2019   PLT 331 10/19/2019   NEUTROABS 17.6 (H) 10/18/2019    DG Chest Portable 1 View  Result Date: 10/18/2019 CLINICAL DATA:  Shortness of breath. EXAM: PORTABLE CHEST 1 VIEW COMPARISON:  December 02, 2018 FINDINGS: LEFT-sided Port-A-Cath terminates at distal aspect of the superior vena cava. Heart size is stable. Hilar structures are partially obscured by diffuse interstitial and airspace disease worse in the RIGHT chest and worse in the RIGHT lower lobe. No sign of pleural effusion. Near confluent disease partially obscures the RIGHT hemidiaphragm. RIGHT heart border is distinct. Visualized skeletal structures on limited assessment without acute process. IMPRESSION: 1. Diffuse interstitial and airspace disease worse in the RIGHT chest and worse in the RIGHT lower lobe. 2. Findings above could represent and are most suspicious for multifocal pneumonia. Asymmetric pulmonary edema and lymphangitic tumor are considered. Laboratory correlation may be helpful for any signs of heart failure with chest CT as warranted. Electronically Signed   By: Zetta Bills M.D.   On: 10/18/2019 19:12   ECHOCARDIOGRAM COMPLETE  Result Date: 10/19/2019    ECHOCARDIOGRAM REPORT   Patient Name:   EDDA OREA Date of Exam: 10/19/2019 Medical Rec #:  [id number]     Height:       65.0 in Accession #:    [id number]    Weight:       130.0 lb Date of Birth:  12-17-1940    BSA:          1.647 m Patient Age:    4 years      BP:           165/75 mmHg Patient Gender: F             HR:           75  bpm. Exam Location:  Inpatient Procedure: 2D Echo and Strain Analysis Indications:  History:        Patient has prior history of Echocardiogram examinations, most                 recent 12/12/2012. Signs/Symptoms:Murmur; Risk                 Factors:Hypertension, Diabetes and Dyslipidemia. History of                 liver and breast cancer. GERD.  Sonographer:    Darlina Sicilian RDCS Referring Phys: [id number] [place]  1. Normal LV systolic function; moderate LVH; grade 2 diastolic dysfunction; myocardium with speckled appearance; consider amyloid; moderate LAE.  2. Left ventricular ejection fraction, by estimation, is 55 to 60%. The left ventricle has normal function. The left ventricle has no regional wall motion abnormalities. There is moderate left ventricular hypertrophy. Left ventricular diastolic parameters are consistent with Grade II diastolic dysfunction (pseudonormalization). Elevated left atrial pressure.  3. Right ventricular systolic function is normal. The right ventricular size is normal. There is moderately elevated pulmonary artery systolic pressure.  4. Left atrial size was moderately dilated.  5. The mitral valve is normal in structure. Trivial mitral valve regurgitation. No evidence of mitral stenosis.  6. The aortic valve is tricuspid. Aortic valve regurgitation is not visualized. No aortic stenosis is present.  7. The inferior vena cava is normal in size with greater than 50% respiratory variability, suggesting right atrial pressure of 3 mmHg. FINDINGS  Left Ventricle: Left ventricular ejection fraction, by estimation, is 55 to 60%. The left ventricle has normal function. The left ventricle has no regional wall motion abnormalities. The left ventricular internal cavity size was normal in size. There is  moderate left ventricular hypertrophy. Left ventricular diastolic parameters are consistent with Grade II diastolic dysfunction (pseudonormalization). Elevated left atrial  pressure. Right Ventricle: The right ventricular size is normal.Right ventricular systolic function is normal. There is moderately elevated pulmonary artery systolic pressure. The tricuspid regurgitant velocity is 3.70 m/s, and with an assumed right atrial pressure of 3 mmHg, the estimated right ventricular systolic pressure is 03.4 mmHg. Left Atrium: Left atrial size was moderately dilated. Right Atrium: Right atrial size was normal in size. Pericardium: Trivial pericardial effusion is present. Mitral Valve: The mitral valve is normal in structure. Normal mobility of the mitral valve leaflets. Trivial mitral valve regurgitation. No evidence of mitral valve stenosis. Tricuspid Valve: The tricuspid valve is normal in structure. Tricuspid valve regurgitation is mild . No evidence of tricuspid stenosis. Aortic Valve: The aortic valve is tricuspid. Aortic valve regurgitation is not visualized. No aortic stenosis is present. Pulmonic Valve: The pulmonic valve was normal in structure. Pulmonic valve regurgitation is trivial. No evidence of pulmonic stenosis. Aorta: The aortic root is normal in size and structure. Venous: The inferior vena cava is normal in size with greater than 50% respiratory variability, suggesting right atrial pressure of 3 mmHg. IAS/Shunts: No atrial level shunt detected by color flow Doppler. Additional Comments: Normal LV systolic function; moderate LVH; grade 2 diastolic dysfunction; myocardium with speckled appearance; consider amyloid; moderate LAE.  LEFT VENTRICLE PLAX 2D LVIDd:         4.10 cm      Diastology LVIDs:         3.20 cm      LV e' lateral:   8.27 cm/s LV PW:         1.50 cm      LV E/e' lateral: 10.7 LV IVS:        1.40 cm      LV e' medial:    4.57 cm/s LVOT diam:     1.90 cm      LV E/e' medial:  19.3 LV SV:         56 LV SV Index:   34 LVOT Area:     2.84 cm  LV Volumes (MOD) LV vol d, MOD A2C: 106.0 ml LV vol d, MOD A4C: 111.0 ml LV vol s, MOD A2C: 42.9 ml LV vol s, MOD A4C:  65.0 ml LV SV MOD A2C:     63.1 ml LV SV MOD A4C:     111.0 ml LV SV MOD BP:      54.9 ml RIGHT VENTRICLE RV S prime:     15.80 cm/s TAPSE (M-mode): 2.3 cm LEFT ATRIUM             Index       RIGHT  ATRIUM           Index LA diam:        3.90 cm 2.37 cm/m  RA Area:     16.90 cm LA Vol (A2C):   72.1 ml 43.77 ml/m RA Volume:   44.10 ml  26.77 ml/m LA Vol (A4C):   80.8 ml 49.05 ml/m LA Biplane Vol: 77.3 ml 46.93 ml/m  AORTIC VALVE LVOT Vmax:   96.60 cm/s LVOT Vmean:  69.100 cm/s LVOT VTI:    0.197 m  AORTA Ao Root diam: 2.80 cm MITRAL VALVE               TRICUSPID VALVE MV Area (PHT): 5.66 cm    TR Peak grad:   54.8 mmHg MV Decel Time: 134 msec    TR Vmax:        370.00 cm/s MV E velocity: 88.30 cm/s MV A velocity: 45.40 cm/s  SHUNTS MV E/A ratio:  1.94        Systemic VTI:  0.20 m                            Systemic Diam: 1.90 cm Kirk Ruths MD Electronically signed by Kirk Ruths MD Signature Date/Time: 10/19/2019/12:52:52 PM    Final     ASSESSMENT AND PLAN: 1.  Intrahepatic cholangiocarcinoma with peritoneal metastasis 2.  Acute hypoxic respiratory failure -due to multifocal pneumonia as well as CHF from hypertensive urgency and flash pulmonary edema 3.  Hypertensive urgency 4.  Diabetes mellitus 5.  CKD stage III yea 6.  Anemia  -Ms. Quast is feeling much better following diuresis and IV antibiotics.  Continue IV antibiotics per hospitalist. -Now off nitroglycerin drip.  Continue amlodipine, ARB, metoprolol, and hydrochlorothiazide.  Echocardiogram obtained and is pending.  Lasix per hospitalist. -Recommend close monitoring of hemoglobin and transfuse for hemoglobin less than 8.  She currently has an outpatient appointment this Saturday for blood transfusion at the cancer center.  We will likely cancel his appointment depending on whether she stays inpatient and what her hemoglobin is prior to discharge.   LOS: 1 day   Mikey Bussing, DNP, AGPCNP-BC, AOCNP 10/19/19   Addendum  I  have seen the patient, examined her. I agree with the assessment and and plan and have edited the notes.   Pt developed flush pulmonary edema and pneumonia after chemo in my office yesterday. She is doing much better today. The IVF given with chemo is only 250-367m, not sure if it's related to her chemo. She told she did have intermittent wheezing in the past, especially at night, usually resolves on it's own. Echo showed diastolic dysfuction, normal EF.   Agree with blood transfusion, I will cancel her blood transfusion in our office on 8/7. .Marland Kitcheneep Hg >8.0.   I discussed her cancer status and treatment with her son at bedside today.   Will f/u as needed, please do not hesitate to call u[place]if you have any questions for [place]   YTruitt Merle 10/19/2019

## 2019-10-19 NOTE — Progress Notes (Addendum)
Pt c/o SOB.  SpO2 92 on 2 L/min, previously SpO2 98% on 2 L/min. Crackles auscultated in mid and lower lobes bilaterally. Pt in no distress at this time. Able to speak in full sentences.   O2 increase to 4L/min and SpO2 95%.  MD notified. BP 214/86, hydralazine 5mg  given.  Will continue to monitor pt.

## 2019-10-20 ENCOUNTER — Inpatient Hospital Stay (HOSPITAL_COMMUNITY): Payer: Medicare Other

## 2019-10-20 DIAGNOSIS — Z515 Encounter for palliative care: Secondary | ICD-10-CM

## 2019-10-20 DIAGNOSIS — Z7189 Other specified counseling: Secondary | ICD-10-CM

## 2019-10-20 LAB — BASIC METABOLIC PANEL
Anion gap: 14 (ref 5–15)
BUN: 31 mg/dL — ABNORMAL HIGH (ref 8–23)
CO2: 22 mmol/L (ref 22–32)
Calcium: 8.7 mg/dL — ABNORMAL LOW (ref 8.9–10.3)
Chloride: 104 mmol/L (ref 98–111)
Creatinine, Ser: 1.22 mg/dL — ABNORMAL HIGH (ref 0.44–1.00)
GFR calc Af Amer: 48 mL/min — ABNORMAL LOW (ref >60)
GFR calc non Af Amer: 42 mL/min — ABNORMAL LOW (ref >60)
Glucose, Bld: 190 mg/dL — ABNORMAL HIGH (ref 70–99)
Potassium: 3.8 mmol/L (ref 3.5–5.1)
Sodium: 140 mmol/L (ref 135–145)

## 2019-10-20 LAB — IRON AND TIBC
Iron: 176 ug/dL — ABNORMAL HIGH (ref 28–170)
Saturation Ratios: 82 % — ABNORMAL HIGH (ref 10.4–31.8)
TIBC: 214 ug/dL — ABNORMAL LOW (ref 250–450)
UIBC: 38 ug/dL

## 2019-10-20 LAB — BLOOD GAS, ARTERIAL
Acid-base deficit: 0.7 mmol/L (ref 0.0–2.0)
Bicarbonate: 23 mmol/L (ref 20.0–28.0)
Drawn by: 35043
FIO2: 36
O2 Saturation: 96.3 %
Patient temperature: 37.4
pCO2 arterial: 35.2 mmHg (ref 32.0–48.0)
pH, Arterial: 7.432 (ref 7.350–7.450)
pO2, Arterial: 82.9 mmHg — ABNORMAL LOW (ref 83.0–108.0)

## 2019-10-20 LAB — BPAM RBC
Blood Product Expiration Date: 202108272359
ISSUE DATE / TIME: 202108051237
Unit Type and Rh: 6200

## 2019-10-20 LAB — GLUCOSE, CAPILLARY
Glucose-Capillary: 79 mg/dL (ref 70–99)
Glucose-Capillary: 99 mg/dL (ref 70–99)
Glucose-Capillary: 128 mg/dL — ABNORMAL HIGH (ref 70–99)
Glucose-Capillary: 157 mg/dL — ABNORMAL HIGH (ref 70–99)

## 2019-10-20 LAB — TYPE AND SCREEN
ABO/RH(D): A POS
Antibody Screen: NEGATIVE
Unit division: 0

## 2019-10-20 LAB — CBC
HCT: 29 % — ABNORMAL LOW (ref 36.0–46.0)
Hemoglobin: 9.4 g/dL — ABNORMAL LOW (ref 12.0–15.0)
MCH: 30.5 pg (ref 26.0–34.0)
MCHC: 32.4 g/dL (ref 30.0–36.0)
MCV: 94.2 fL (ref 80.0–100.0)
Platelets: 423 10*3/uL — ABNORMAL HIGH (ref 150–400)
RBC: 3.08 MIL/uL — ABNORMAL LOW (ref 3.87–5.11)
RDW: 17.3 % — ABNORMAL HIGH (ref 11.5–15.5)
WBC: 10.6 10*3/uL — ABNORMAL HIGH (ref 4.0–10.5)
nRBC: 0 % (ref 0.0–0.2)

## 2019-10-20 LAB — FERRITIN: Ferritin: 1449 ng/mL — ABNORMAL HIGH (ref 11–307)

## 2019-10-20 LAB — TROPONIN I (HIGH SENSITIVITY): Troponin I (High Sensitivity): 60 ng/L — ABNORMAL HIGH (ref <18)

## 2019-10-20 MED ORDER — HYDRALAZINE HCL 20 MG/ML IJ SOLN
10.0000 mg | INTRAMUSCULAR | Status: DC | PRN
  Administered 2019-10-20: 10 mg via INTRAVENOUS
  Filled 2019-10-20: qty 1

## 2019-10-20 MED ORDER — METOPROLOL TARTRATE 5 MG/5ML IV SOLN: 5.0000 mg | INTRAVENOUS | Status: DC | PRN

## 2019-10-20 MED ORDER — FUROSEMIDE 10 MG/ML IJ SOLN
60.0000 mg | Freq: Once | INTRAMUSCULAR | Status: AC
  Administered 2019-10-20: 60 mg via INTRAVENOUS
  Filled 2019-10-20: qty 6

## 2019-10-20 NOTE — Consult Note (Signed)
Palliative Medicine Inpatient Consult Note  Reason for consult:  Goals of Care "elevated 12 month mortality score"  HPI:  Per intake H&P --> Donna May is a 79 y.o. 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 In the ER patient was hypoxic to the 80s. Chest x-ray showed multifocal pneumonia, blood pressure was 469 systolic.She was admitted, started on antibiotics, diuretics, nitro, and supplemental oxygen  Palliative care was asked to see Donna May in the setting of an elevated mortality score.  Clinical Assessment/Goals of Care: I have reviewed medical records including EPIC notes, labs and imaging, received report from bedside RN, assessed the patient who was alert x4 and sitting up in her bed.    I met with Donna May and her daughter, Donna May to further discuss diagnosis prognosis, GOC, EOL wishes, disposition and options.   I introduced Palliative Medicine as specialized medical care for people living with serious illness. It focuses on providing relief from the symptoms and stress of a serious illness. The goal is to improve quality of life for both the patient and the family.  I asked Donna May to tell me about herself. Donna May shares that she is from [place], [place]. She has lived her throughout her life. She is not married though she has two children. A son, Donna May and a daughter, Donna May. She use to work at the [employer] office. She shares that this was a busy job. She use to enjoy working on her lawn and gardening with flowers. She shares that about a year ago she had to cut back on her yard work in the setting of her diagnosis of cholangiocarcinoma. She is a woman of faith and practices within the [facility] denomination.  Patient has been living independently at home, she is responsible for all of her bADL's and iADL's. She remains to  drive and eat our frequently. She shares that she enjoys going out to eat with her brother, Donna May. She is still doing all of her own grocery shopping. If she needs aid her children are local.   A detailed discussion was had today regarding advanced directives, patient has completed POA paperwork in the past --> a copy requested.   I introduced the MOST form --> Concepts specific to code status, artifical feeding and hydration, continued IV antibiotics and rehospitalization was had.  Donna May shares that she helped her mother during her final phases of life as she did her father. She expresses that she would not wish to suffer. Asked her to review the  "Hard Choices for [employer]" booklet. Shared with her that after she had spoken to her family we could review the MOST form together. Expressed my concerns over putting her through a CODE situation that the burdens of this may outweigh the benefits. Most especially given Donna May's level of independence. She reflected on her life and the age her patients died. I utilized therapeutic listing as a form of emotional support.   The difference between a aggressive medical intervention path  and a palliative comfort care path for this patient at this time was had. Explained the differenced between Palliative and hospice services.  Discussed the importance of continued conversation with family and their  medical providers regarding overall plan of care and treatment options, ensuring decisions are within the context of the patients values and GOCs.  Decision Maker: Patient can make decisions for  herself.  SUMMARY OF RECOMMENDATIONS   Full Code  Full Scope of Treatment  Request copy of HCPOA documents  Ongoing PMT support in the setting of advance care planning --> Plan to complete a MOST prior to discharge  TOC --> Patient would benefit from OP Palliative Support and is in agreement with this referral  Code Status/Advance Care Planning: FULL CODE     Palliative Prophylaxis:   Oral Care, Mobility  Additional Recommendations (Limitations, Scope, Preferences):  Continue current scope of care   Psycho-social/Spiritual:   Desire for further Chaplaincy support: Yes  Additional Recommendations: Education on Palliative support   Prognosis: Unclear  Discharge Planning: Likely back home with OP Palliative care  PPS: 80%   This conversation/these recommendations were discussed with patient primary care team, Dr. Danford  Time In: 1150 Time Out: 1300 Total Time: 70 Greater than 50%  of this time was spent counseling and coordinating care related to the above assessment and plan.  Michelle Ferolito [place] Palliative Medicine Team Team Cell Phone: [phone] Please utilize secure chat with additional questions, if there is no response within 30 minutes please call the above phone number  Palliative Medicine Team providers are available by phone from 7am to 7pm daily and can be reached through the team cell phone.  Should this patient require assistance outside of these hours, please call the patient's attending physician.  

## 2019-10-20 NOTE — Progress Notes (Signed)
   10/20/19 1512  Clinical Encounter Type  Visited With Patient  Visit Type Initial;Spiritual support  Referral From Palliative care team  Consult/Referral To Chaplain  Spiritual Encounters  Spiritual Needs Prayer  This chaplain responded to PMT consult for spiritual care.  The Pt. is bedside in the recliner trying to nap after a sleepless night. The Pt. shared she is more comfortable now with fewer struggles breathing.  The Pt. is cautious in her story telling until our prayer together.  At this point, the Pt. is more comfortable sharing stories about her faith community, children and expected grandchild.  The chaplain supports the Pt. need for rest and time to review "Hard Choices" and MOST form. The Pt replies I have already made a lot of the decisions.  The Pt. is appreciative of the visit and open to a F/U spiritual care visit.

## 2019-10-20 NOTE — Progress Notes (Signed)
   10/19/19 2334  Assess: MEWS Score  Pulse Rate 96  ECG Heart Rate 97  Resp (!) 22  SpO2 96 %  Assess: MEWS Score  MEWS Temp 0  MEWS Systolic 2  MEWS Pulse 0  MEWS RR 1  MEWS LOC 0  MEWS Score 3  MEWS Score Color Yellow  Assess: if the MEWS score is Yellow or Red  Were vital signs taken at a resting state? No  Focused Assessment No change from prior assessment  Early Detection of Sepsis Score *See Row Information* Low  MEWS guidelines implemented *See Row Information* Yes  Treat  MEWS Interventions Administered prn meds/treatments;Consulted Respiratory Therapy  Take Vital Signs  Increase Vital Sign Frequency  Yellow: Q 2hr X 2 then Q 4hr X 2, if remains yellow, continue Q 4hrs  Escalate  MEWS: Escalate Yellow: discuss with charge nurse/RN and consider discussing with provider and RRT  Notify: Charge Nurse/RN  Name of Charge Nurse/RN Notified Amanda, RN  Date Charge Nurse/RN Notified 10/19/19  Time Charge Nurse/RN Notified 2338  Notify: Provider  Provider Name/Title Rathore, MD  Date Provider Notified 10/19/19  Time Provider Notified 2339  Notification Type Page  Notification Reason Change in status  Response  (New orders placed, MD at the bedside)  Date of Provider Response 10/19/19  Time of Provider Response 2345  Document  Patient Outcome Stabilized after interventions  Progress note created (see row info) Yes  Pt condition stabilized after interventions, will continue to monitor closely.

## 2019-10-20 NOTE — Progress Notes (Signed)
PROGRESS NOTE    Donna May  KGU:[id number] DOB: 26-Feb-1941 DOA: 10/18/2019 PCP: [name], MD      Brief Narrative:  Donna May is a 79 y.o. 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  In the ER patient was hypoxic to the 80s. Chest x-ray showed multifocal pneumonia, blood pressure was 628 systolic.  She was admitted, started on antibiotics, diuretics, nitro, and supplemental oxygen.           Assessment & Plan:  Acute hypoxic respiratory failure Multifactorial, due to multifocal pneumonia, as well as congestive heart failure due to hypertensive emergency with flash pulmonary edema.  Improved rapidly the initial night with Lasix and nitroglycerin and blood pressure control.  Overnight last night had another blood pressure spike and dyspnea, improved again with blood pressure control and Lasix.  Still on oxygen   Multifocal pneumonia, left upper, lower lobes Procalcitonin 13, bacterial infection very likely.  Blood culture still negative -Continue ceftriaxone and azithromycin -Follow blood cultures  Hypertensive emergency leading to acute congestive heart failure and flash pulmonary edema Blood pressure spike last night again leading to respiratory distress.  Resolved with IV Lasix.  D-dimer considered, but I agree with not obtaining, PE unlikely.  Echocardiogram shows grade 2 diastolic dysfunction, speckled appearance of myocardium hard to explain, cholangiocarcinoma not associated with amyloid, and cardiac metastasis would be very rare. -Continue amlodipine, ARB, metoprolol, HCTZ -Furosemide 40 mg IV twice a day  -K supplement -Strict I/Os, daily weights, telemetry  -Daily monitoring renal function    Diabetes Glucose controlled -Continue aspirin, statins, corrections, sitagliptin as  formulary alternative   CKD IIIa  Cr stable relative to baseline  Anemia Anemia due to chemotherapy.  He is 1 unit.  Hemoglobin up to 9 post transfusion.  Iron replete. -Transfusion threshold 9 g/dL       Disposition: Status is: Inpatient  Remains inpatient appropriate because:IV treatments appropriate due to intensity of illness or inability to take PO   Dispo: The patient is from: Home              Anticipated d/c is to: Home              Anticipated d/c date is: 3 days              Patient currently is not medically stable to d/c.              MDM: The below labs and imaging reports were reviewed and summarized above.  Medication management as above.     DVT prophylaxis: enoxaparin (LOVENOX) injection 40 mg Start: 10/19/19 1000  Code Status: Full code Family Communication: Daughter by phone    Consultants:     Procedures:   Echocardiogram with normal EF, normal valves  Antimicrobials:   Ceftriaxone and azithromycin 8/4>>  Culture data:   8/5 Blood cultures x2 pending          Subjective: Patient with an episode of dyspnea overnight, improved with Lasix.  Still on oxygen.  No chest pain, leg swelling, orthopnea, confusion, fever, sputum production.  Objective: Vitals:   10/20/19 0643 10/20/19 0703 10/20/19 0759 10/20/19 1705  BP: (!) 148/81  (!) 159/78 (!) 157/66  Pulse: 73 69  66  Resp: 18 16    Temp:   98.9 F (37.2 C) 98.3 F (36.8 C)  TempSrc:   Oral   SpO2: 100% 100%  100%  Weight:      Height:        Intake/Output Summary (Last 24 hours) at 10/20/2019 1758 Last data filed at 10/20/2019 1633 Gross per 24 hour  Intake 1156.62 ml  Output 2100 ml  Net -943.38 ml   Filed Weights   10/18/19 1834 10/20/19 0500  Weight: 59 kg 58 kg    Examination: General appearance: Adult female, sitting up in recliner, in    no acute distress. HEENT:    Skin: No rashes or lesions. Cardiac: Regular rate and rhythm, no murmurs  appreciated, JVP normal, no lower extremity edema Respiratory: Respiratory rate increased, nasal cannula in place, rales at bilateral bases,Abdomen: No tenderness palpation or guarding, no ascites or distention MSK: No deformities or effusion, normal muscle bulk and tone Neuro: Awake alert, extraocular movements intact, moves all extremities normal strength and coordination, speech fluent Psych: Attention normal, affect blunted, judgment insight appear normal      Data Reviewed: I have personally reviewed following labs and imaging studies:  CBC: Recent Labs  Lab 10/18/19 0830 10/18/19 1944 10/19/19 0620 10/19/19 2026 10/20/19 0141  WBC 9.3 19.1* 10.7*  --  10.6*  NEUTROABS 7.1 17.6*  --   --   --   HGB 7.0* 7.9* 6.3* 8.2* 9.4*  HCT 22.4* 25.7* 20.4* 25.1* 29.0*  MCV 97.4 99.6 100.5*  --  94.2  PLT 297 379 331  --  885*   Basic Metabolic Panel: Recent Labs  Lab 10/18/19 0830 10/18/19 1944 10/19/19 0536 10/19/19 0620 10/20/19 0141  NA 141 136 140 140 140  K 3.8 4.0 4.0 4.1 3.8  CL 110 105 105 105 104  CO2 22 21* 23 23 22   GLUCOSE 147* 172* 136* 108* 190*  BUN 27* 26* 29* 30* 31*  CREATININE 1.33* 1.32* 1.33* 1.31* 1.22*  CALCIUM 8.8* 8.1* 8.1* 8.1* 8.7*  MG 2.4  --   --   --   --    GFR: Estimated Creatinine Clearance: 33.1 mL/min (A) (by C-G formula based on SCr of 1.22 mg/dL (H)). Liver Function Tests: Recent Labs  Lab 10/18/19 0830 10/18/19 1944  AST 19 27  ALT 7 11  ALKPHOS 68 53  BILITOT 0.5 0.9  PROT 6.8 7.8  ALBUMIN 2.8* 3.1*   No results for input(s): LIPASE, AMYLASE in the last 168 hours. No results for input(s): AMMONIA in the last 168 hours. Coagulation Profile: No results for input(s): INR, PROTIME in the last 168 hours. Cardiac Enzymes: No results for input(s): CKTOTAL, CKMB, CKMBINDEX, TROPONINI in the last 168 hours. BNP (last 3 results) No results for input(s): PROBNP in the last 8760 hours. HbA1C: Recent Labs    10/19/19 0019    HGBA1C 5.2   CBG: Recent Labs  Lab 10/19/19 1714 10/19/19 2212 10/20/19 0755 10/20/19 1217 10/20/19 1703  GLUCAP 101* 99 79 99 128*   Lipid Profile: No results for input(s): CHOL, HDL, LDLCALC, TRIG, CHOLHDL, LDLDIRECT in the last 72 hours. Thyroid Function Tests: No results for input(s): TSH, T4TOTAL, FREET4, T3FREE, THYROIDAB in the last 72 hours. Anemia Panel: Recent Labs    10/20/19 0141  FERRITIN 1,449*  TIBC 214*  IRON 176*   Urine analysis: No results found for: COLORURINE, APPEARANCEUR, LABSPEC, PHURINE, GLUCOSEU, HGBUR, BILIRUBINUR, KETONESUR, PROTEINUR, UROBILINOGEN, NITRITE, LEUKOCYTESUR Sepsis Labs: @LABRCNTIP (procalcitonin:4,lacticacidven:4)  ) Recent Results (from the past 240 hour(s))  SARS Coronavirus 2 by RT PCR (hospital order, performed in [facility]) Nasopharyngeal Nasopharyngeal Swab     Status: None   Collection Time: 10/18/19  8:24 PM   Specimen: Nasopharyngeal Swab  Result Value Ref Range Status   SARS Coronavirus 2 NEGATIVE NEGATIVE Final    Comment: (NOTE) SARS-CoV-2 target nucleic acids are NOT DETECTED.  The SARS-CoV-2 RNA is generally detectable in upper and lower respiratory specimens during the acute phase of infection. The lowest concentration of SARS-CoV-2 viral copies this assay can detect is 250 copies / mL. A negative result does not preclude SARS-CoV-2 infection and should not be used as the sole basis for treatment or other patient management decisions.  A negative result may occur with improper specimen collection / handling, submission of specimen other than nasopharyngeal swab, presence of viral mutation(s) within the areas targeted by this assay, and inadequate number of viral copies (<250 copies / mL). A negative result must be combined with clinical observations, patient history, and epidemiological information.  Fact Sheet for Patients:   StrictlyIdeas.no  Fact Sheet for  Healthcare Providers: BankingDealers.co.za  This test is not yet approved or  cleared by the [place] FDA and has been authorized for detection and/or diagnosis of SARS-CoV-2 by FDA under an Emergency Use Authorization (EUA).  This EUA will remain in effect (meaning this test can be used) for the duration of the COVID-19 declaration under Section 564(b)(1) of the Act, 21 U.S.C. section 360bbb-3(b)(1), unless the authorization is terminated or revoked sooner.  Performed at [facility], [place] [address] [place], [place] [address]   Culture, blood (routine x 2)     Status: None (Preliminary result)   Collection Time: 10/19/19 10:20 AM   Specimen: BLOOD LEFT ARM  Result Value Ref Range Status   Specimen Description BLOOD LEFT ARM  Final   Special Requests   Final    BOTTLES DRAWN AEROBIC AND ANAEROBIC Blood Culture adequate volume   Culture   Final    NO GROWTH 1 DAY Performed at [facility], [place] [address], [place], [place] [address]    Report Status PENDING  Incomplete  Culture, blood (routine x 2)     Status: None (Preliminary result)   Collection Time: 10/19/19 10:55 AM   Specimen: BLOOD LEFT ARM  Result Value Ref Range Status   Specimen Description BLOOD LEFT ARM  Final   Special Requests   Final    BOTTLES DRAWN AEROBIC AND ANAEROBIC Blood Culture adequate volume   Culture   Final    NO GROWTH 1 DAY Performed at [facility], [name] [address] [place], [place] [address]    Report Status PENDING  Incomplete  MRSA PCR Screening     Status: None   Collection Time: 10/19/19 10:21 PM   Specimen: Nasal Mucosa; Nasopharyngeal  Result Value Ref Range Status   MRSA by PCR NEGATIVE NEGATIVE Final    Comment:        The GeneXpert MRSA Assay (FDA approved for NASAL specimens only), is one component of a comprehensive MRSA colonization surveillance program. It is not intended to diagnose MRSA infection nor to guide or monitor  treatment for MRSA infections. Performed at [facility], [place] [address], [place], [place] [address]          Radiology Studies: DG CHEST PORT 1 VIEW  Result Date: 10/20/2019 CLINICAL DATA:  Shortness of breath EXAM: PORTABLE CHEST 1 VIEW COMPARISON:  12/17/2015, 10/18/2019 FINDINGS: Left-sided central venous port tip over the SVC. Stable  cardiomegaly. Slightly improved aeration of the right base. Asymmetric right greater than left ground-glass opacities and consolidations are otherwise unchanged. No pneumothorax. IMPRESSION: Slightly improved aeration at the right base. Otherwise no significant interval change in bilateral ground-glass opacities and consolidations. Similar cardiomegaly with vascular congestion. Electronically Signed   By: Donavan Foil M.D.   On: 10/20/2019 00:36   DG Chest Portable 1 View  Result Date: 10/18/2019 CLINICAL DATA:  Shortness of breath. EXAM: PORTABLE CHEST 1 VIEW COMPARISON:  December 02, 2018 FINDINGS: LEFT-sided Port-A-Cath terminates at distal aspect of the superior vena cava. Heart size is stable. Hilar structures are partially obscured by diffuse interstitial and airspace disease worse in the RIGHT chest and worse in the RIGHT lower lobe. No sign of pleural effusion. Near confluent disease partially obscures the RIGHT hemidiaphragm. RIGHT heart border is distinct. Visualized skeletal structures on limited assessment without acute process. IMPRESSION: 1. Diffuse interstitial and airspace disease worse in the RIGHT chest and worse in the RIGHT lower lobe. 2. Findings above could represent and are most suspicious for multifocal pneumonia. Asymmetric pulmonary edema and lymphangitic tumor are considered. Laboratory correlation may be helpful for any signs of heart failure with chest CT as warranted. Electronically Signed   By: Zetta Bills M.D.   On: 10/18/2019 19:12   ECHOCARDIOGRAM COMPLETE  Result Date: 10/19/2019    ECHOCARDIOGRAM REPORT   Patient  Name:   ALLYNA PITTSLEY Date of Exam: 10/19/2019 Medical Rec #:  [id number]     Height:       65.0 in Accession #:    [id number]    Weight:       130.0 lb Date of Birth:  [birth year]    BSA:          1.647 m Patient Age:    29 years      BP:           165/75 mmHg Patient Gender: F             HR:           75 bpm. Exam Location:  Inpatient Procedure: 2D Echo and Strain Analysis Indications:  History:        Patient has prior history of Echocardiogram examinations, most                 recent 12/12/2012. Signs/Symptoms:Murmur; Risk                 Factors:Hypertension, Diabetes and Dyslipidemia. History of                 liver and breast cancer. GERD.  Sonographer:    Darlina Sicilian RDCS Referring Phys: [id number] [place]  1. Normal LV systolic function; moderate LVH; grade 2 diastolic dysfunction; myocardium with speckled appearance; consider amyloid; moderate LAE.  2. Left ventricular ejection fraction, by estimation, is 55 to 60%. The left ventricle has normal function. The left ventricle has no regional wall motion abnormalities. There is moderate left ventricular hypertrophy. Left ventricular diastolic parameters are consistent with Grade II diastolic dysfunction (pseudonormalization). Elevated left atrial pressure.  3. Right ventricular systolic function is normal. The right ventricular size is normal. There is moderately elevated pulmonary artery systolic pressure.  4. Left atrial size was moderately dilated.  5. The mitral valve is normal in structure. Trivial mitral valve regurgitation. No evidence of mitral stenosis.  6. The aortic valve is tricuspid. Aortic valve regurgitation is not visualized. No aortic stenosis is present.  7.  The inferior vena cava is normal in size with greater than 50% respiratory variability, suggesting right atrial pressure of 3 mmHg. FINDINGS  Left Ventricle: Left ventricular ejection fraction, by estimation, is 55 to 60%. The left ventricle has normal function. The  left ventricle has no regional wall motion abnormalities. The left ventricular internal cavity size was normal in size. There is  moderate left ventricular hypertrophy. Left ventricular diastolic parameters are consistent with Grade II diastolic dysfunction (pseudonormalization). Elevated left atrial pressure. Right Ventricle: The right ventricular size is normal.Right ventricular systolic function is normal. There is moderately elevated pulmonary artery systolic pressure. The tricuspid regurgitant velocity is 3.70 m/s, and with an assumed right atrial pressure of 3 mmHg, the estimated right ventricular systolic pressure is 53.6 mmHg. Left Atrium: Left atrial size was moderately dilated. Right Atrium: Right atrial size was normal in size. Pericardium: Trivial pericardial effusion is present. Mitral Valve: The mitral valve is normal in structure. Normal mobility of the mitral valve leaflets. Trivial mitral valve regurgitation. No evidence of mitral valve stenosis. Tricuspid Valve: The tricuspid valve is normal in structure. Tricuspid valve regurgitation is mild . No evidence of tricuspid stenosis. Aortic Valve: The aortic valve is tricuspid. Aortic valve regurgitation is not visualized. No aortic stenosis is present. Pulmonic Valve: The pulmonic valve was normal in structure. Pulmonic valve regurgitation is trivial. No evidence of pulmonic stenosis. Aorta: The aortic root is normal in size and structure. Venous: The inferior vena cava is normal in size with greater than 50% respiratory variability, suggesting right atrial pressure of 3 mmHg. IAS/Shunts: No atrial level shunt detected by color flow Doppler. Additional Comments: Normal LV systolic function; moderate LVH; grade 2 diastolic dysfunction; myocardium with speckled appearance; consider amyloid; moderate LAE.  LEFT VENTRICLE PLAX 2D LVIDd:         4.10 cm      Diastology LVIDs:         3.20 cm      LV e' lateral:   8.27 cm/s LV PW:         1.50 cm      LV  E/e' lateral: 10.7 LV IVS:        1.40 cm      LV e' medial:    4.57 cm/s LVOT diam:     1.90 cm      LV E/e' medial:  19.3 LV SV:         56 LV SV Index:   34 LVOT Area:     2.84 cm  LV Volumes (MOD) LV vol d, MOD A2C: 106.0 ml LV vol d, MOD A4C: 111.0 ml LV vol s, MOD A2C: 42.9 ml LV vol s, MOD A4C: 65.0 ml LV SV MOD A2C:     63.1 ml LV SV MOD A4C:     111.0 ml LV SV MOD BP:      54.9 ml RIGHT VENTRICLE RV S prime:     15.80 cm/s TAPSE (M-mode): 2.3 cm LEFT ATRIUM             Index       RIGHT ATRIUM           Index LA diam:        3.90 cm 2.37 cm/m  RA Area:     16.90 cm LA Vol (A2C):   72.1 ml 43.77 ml/m RA Volume:   44.10 ml  26.77 ml/m LA Vol (A4C):   80.8 ml 49.05 ml/m LA Biplane Vol: 77.3 ml 46.93  ml/m  AORTIC VALVE LVOT Vmax:   96.60 cm/s LVOT Vmean:  69.100 cm/s LVOT VTI:    0.197 m  AORTA Ao Root diam: 2.80 cm MITRAL VALVE               TRICUSPID VALVE MV Area (PHT): 5.66 cm    TR Peak grad:   54.8 mmHg MV Decel Time: 134 msec    TR Vmax:        370.00 cm/s MV E velocity: 88.30 cm/s MV A velocity: 45.40 cm/s  SHUNTS MV E/A ratio:  1.94        Systemic VTI:  0.20 m                            Systemic Diam: 1.90 cm Kirk Ruths MD Electronically signed by Kirk Ruths MD Signature Date/Time: 10/19/2019/12:52:52 PM    Final         Scheduled Meds: . amLODipine  10 mg Oral Daily  . aspirin  81 mg Oral Daily  . chlorhexidine  15 mL Mouth Rinse BID  . Chlorhexidine Gluconate Cloth  6 each Topical Daily  . enoxaparin (LOVENOX) injection  40 mg Subcutaneous Q24H  . hydrochlorothiazide  25 mg Oral Daily  . insulin aspart  0-5 Units Subcutaneous QHS  . insulin aspart  0-9 Units Subcutaneous TID WC  . irbesartan  300 mg Oral Daily  . linagliptin  5 mg Oral Daily  . mouth rinse  15 mL Mouth Rinse q12n4p  . metoprolol succinate  100 mg Oral Daily  . pantoprazole  20 mg Oral Daily  . sodium chloride flush  10-40 mL Intracatheter Q12H  . sodium chloride flush  3 mL Intravenous Q12H    Continuous Infusions: . sodium chloride    . azithromycin 500 mg (10/20/19 0139)  . cefTRIAXone (ROCEPHIN)  IV 1 g (10/19/19 2214)     LOS: 2 days    Time spent: 35 minutes    Edwin Dada, MD [facility] 10/20/2019, 5:58 PM     Please page though [place] or Epic secure chat:  For [employer], Adult nurse

## 2019-10-21 DIAGNOSIS — Z66 Do not resuscitate: Secondary | ICD-10-CM

## 2019-10-21 LAB — BASIC METABOLIC PANEL
Anion gap: 9 (ref 5–15)
BUN: 33 mg/dL — ABNORMAL HIGH (ref 8–23)
CO2: 25 mmol/L (ref 22–32)
Calcium: 8.2 mg/dL — ABNORMAL LOW (ref 8.9–10.3)
Chloride: 105 mmol/L (ref 98–111)
Creatinine, Ser: 1.21 mg/dL — ABNORMAL HIGH (ref 0.44–1.00)
GFR calc Af Amer: 49 mL/min — ABNORMAL LOW (ref >60)
GFR calc non Af Amer: 42 mL/min — ABNORMAL LOW (ref >60)
Glucose, Bld: 85 mg/dL (ref 70–99)
Potassium: 3.3 mmol/L — ABNORMAL LOW (ref 3.5–5.1)
Sodium: 139 mmol/L (ref 135–145)

## 2019-10-21 LAB — CBC
HCT: 22.6 % — ABNORMAL LOW (ref 36.0–46.0)
Hemoglobin: 7.1 g/dL — ABNORMAL LOW (ref 12.0–15.0)
MCH: 30.3 pg (ref 26.0–34.0)
MCHC: 31.4 g/dL (ref 30.0–36.0)
MCV: 96.6 fL (ref 80.0–100.0)
Platelets: 325 10*3/uL (ref 150–400)
RBC: 2.34 MIL/uL — ABNORMAL LOW (ref 3.87–5.11)
RDW: 17.2 % — ABNORMAL HIGH (ref 11.5–15.5)
WBC: 5 10*3/uL (ref 4.0–10.5)
nRBC: 0 % (ref 0.0–0.2)

## 2019-10-21 LAB — PROCALCITONIN: Procalcitonin: 22.54 ng/mL

## 2019-10-21 LAB — GLUCOSE, CAPILLARY
Glucose-Capillary: 78 mg/dL (ref 70–99)
Glucose-Capillary: 108 mg/dL — ABNORMAL HIGH (ref 70–99)
Glucose-Capillary: 91 mg/dL (ref 70–99)
Glucose-Capillary: 130 mg/dL — ABNORMAL HIGH (ref 70–99)

## 2019-10-21 MED ORDER — POTASSIUM CHLORIDE CRYS ER 20 MEQ PO TBCR
20.0000 meq | EXTENDED_RELEASE_TABLET | Freq: Two times a day (BID) | ORAL | Status: DC
  Administered 2019-10-21 – 2019-10-22 (×3): 20 meq via ORAL
  Filled 2019-10-21 (×3): qty 1

## 2019-10-21 MED ORDER — FUROSEMIDE 10 MG/ML IJ SOLN
60.0000 mg | Freq: Two times a day (BID) | INTRAMUSCULAR | Status: DC
  Administered 2019-10-21 (×2): 60 mg via INTRAVENOUS
  Filled 2019-10-21 (×2): qty 6

## 2019-10-21 NOTE — TOC Initial Note (Addendum)
Transition of Care [facility]) - Initial/Assessment Note    Patient Details  Name: Donna May MRN: [id number] Date of Birth: 16-Sep-1940  Transition of [facility] CM/SW Contact:    Norina Buzzard, RN Phone Number: 10/21/2019, 11:42 AM  Clinical Narrative:  Pt with cholangiocarcinoma with metastasis to the perirtoneum, on chemo. Referral received to assist with outpt palliative. Met with pt. Pt plans to return home. She is very independent with ADL's. She drives. She denies any issues buying her meds. Discussed preference for a palliative agency. She chose [facility]. [place] with [facility] for referral.           Received referral to assist with outpatient palliative.   Expected Discharge Plan: Home w Hospice Care Barriers to Discharge: Continued Medical Work up   Patient Goals and CMS Choice Patient states their goals for this hospitalization and ongoing recovery are:: to go home      Expected Discharge Plan and Services Expected Discharge Plan: [place]   Discharge Planning Services: CM Consult                                          Prior Living Arrangements/Services     Patient language and need for interpreter reviewed:: Yes Do you feel safe going back to the place where you live?: Yes               Activities of Daily Living Home Assistive Devices/Equipment: Cane (specify quad or straight) ADL Screening (condition at time of admission) Patient's cognitive ability adequate to safely complete daily activities?: Yes Is the patient deaf or have difficulty hearing?: No Does the patient have difficulty seeing, even when wearing glasses/contacts?: No Does the patient have difficulty concentrating, remembering, or making decisions?: No Patient able to express need for assistance with ADLs?: No Does the patient have difficulty dressing or bathing?: No Independently performs ADLs?: Yes (appropriate for  developmental age) Does the patient have difficulty walking or climbing stairs?: Yes Weakness of Legs: Both Weakness of Arms/Hands: None  Permission Sought/Granted Permission sought to share information with : Case Manager                Emotional Assessment Appearance:: Appears stated age, Developmentally appropriate Attitude/Demeanor/Rapport: Engaged, Self-Confident Affect (typically observed): Accepting, Appropriate, Calm, Pleasant Orientation: : Oriented to Self, Oriented to Place, Oriented to  Time, Oriented to Situation      Admission diagnosis:  Acute pulmonary edema (HCC) [J81.0] Acute respiratory failure with hypoxia ([place]) [J96.01] Patient Active Problem List   Diagnosis Date Noted  . DNR (do not resuscitate)   . Palliative care by specialist   . DNR (do not resuscitate) discussion   . Acute respiratory failure with hypoxia ([place]) 10/18/2019  . Hypertensive emergency 10/18/2019  . Anemia 10/18/2019  . CKD (chronic kidney disease), stage III 10/18/2019  . Chemotherapy-induced neuropathy ([place]) 08/28/2019  . Port-A-Cath in place 12/14/2018  . Goals of care, counseling/discussion 11/24/2018  . Status post laparoscopy 11/17/2018  . Genetic testing 11/07/2018  . Family history of prostate cancer   . Family history of breast cancer   . Intrahepatic cholangiocarcinoma ([employer]) 09/20/2018  . Coronary artery calcification 04/20/2016  . Ductal carcinoma in situ (DCIS) of right breast 11/26/2015  . Lymphedema of arm 11/26/2015  . Lymphedema 11/01/2015  . Leg pain, bilateral 12/28/2013  . Special screening  for malignant neoplasms, colon 08/09/2013  . Renal artery stenosis, native, bilateral ([place]) 06/09/2013  . Diabetes mellitus with renal complications ([place]) 14/12/3011  . RASH AND OTHER NONSPECIFIC SKIN ERUPTION 07/05/2008  . Malignant neoplasm of female breast ([place]) 07/04/2008  . HYPERCHOLESTEROLEMIA 07/04/2008  . Essential hypertension 07/04/2008  . GERD 07/04/2008  .  DUODENITIS, WITHOUT HEMORRHAGE 07/04/2008  . HIATAL HERNIA 07/04/2008  . Arthropathy 07/04/2008   PCP:  [name], MD Pharmacy:   [id number] [id number]: [phone]: [phone] [facility] [place] [facility] [id number]: [phone]: [phone]    Social Determinants of Health (SDOH) Interventions    Readmission Risk Interventions No flowsheet data found.

## 2019-10-21 NOTE — Evaluation (Signed)
Physical Therapy Evaluation Patient Details Name: Donna May MRN: [id number] DOB: February 24, 1941 Today's Date: 10/21/2019   History of Present Illness  Patient is a 79 y.o. F with a PMH of DM II, CKD III, anemia, and cholangiocarcinoma. She was admitted to the hospital for acute respiratory failute with hypoxia. Chest x-ray (+) for multifocal pneumonia. Had hypertensive emergency on 10/19/19 which resulted in acute CHF and flash pulmonary edema.    Clinical Impression  Patient was laying in hospital bed at the start of the PT session. She awake and alert, able to answer all questions about home set-up and PLOF. Patient was independent with bed mobility and sat EOB for 5 minutes independently during room/chair/line set-up. During gait training, she walked approximately 100' with RW in hallway on 2L O2. Was not able to obtain O2 sat on pulse ox, however reported that she did not feel fatigued or SOB upon ambulation. Transferred chair to [facility] to sink, only requiring assistance in setting-up equipment for using the bathroom. See below for assistance levels. Left in chair with all needs within reach and chair alarm set. Patient stated she did not feel like she was at her baseline with ambulation and mobility. Patient would continue to benefit from skilled physical therapy while she is in the hospital and I recommend f/u with [facility] PT to address gait and mobility concerns.      Follow Up Recommendations Home health PT    Equipment Recommendations  Rolling walker with 5" wheels    Recommendations for Other Services       Precautions / Restrictions Precautions Precautions: None Restrictions Weight Bearing Restrictions: No      Mobility  Bed Mobility Overal bed mobility: Independent                Transfers Overall transfer level: Needs assistance Equipment used: Rolling walker (2 wheeled) Transfers: Sit to/from Stand Sit to Stand: Supervision         General transfer comment: Sit <>  stand in bed and chair. chair <> BSC. Required increased time and use of AD. Supervision for safety and O2 line management.  Ambulation/Gait Ambulation/Gait assistance: Modified independent (Device/Increase time) Gait Distance (Feet): 100 Feet Assistive device: Rolling walker (2 wheeled) Gait Pattern/deviations: Step-through pattern;Decreased stride length;Trunk flexed Gait velocity: decreased   General Gait Details: Decreased gait velocity, stated she feels like she is below her baseline when walking. Modified independent with gait and transfers.  Stairs            Wheelchair Mobility    Modified Rankin (Stroke Patients Only)       Balance Overall balance assessment: Needs assistance Sitting-balance support: No upper extremity supported;Feet unsupported Sitting balance-Leahy Scale: Good     Standing balance support: Bilateral upper extremity supported;During functional activity Standing balance-Leahy Scale: Good Standing balance comment: No LOB during gait training or transfers                             Pertinent Vitals/Pain Pain Assessment: No/denies pain    Home Living Family/patient expects to be discharged to:: Private residence Living Arrangements: Alone   Type of Home: House Home Access: Stairs to enter Entrance Stairs-Rails: Can reach both Entrance Stairs-Number of Steps: 1-2 Home Layout: One level Home Equipment: Cane - single point Additional Comments: uses cane when necessary but not usually.    Prior Function Level of Independence: Independent  Hand Dominance   Dominant Hand: Right    Extremity/Trunk Assessment   Upper Extremity Assessment Upper Extremity Assessment: Defer to OT evaluation (noted impaired sensation in both hands with L hand experiencing more deficits secondary to peripheral neuropathy)    Lower Extremity Assessment Lower Extremity Assessment: Overall WFL for tasks assessed (Impaired  sensation in both feet secondary to peripheral neuropathy)    Cervical / Trunk Assessment Cervical / Trunk Assessment: Normal  Communication   Communication: No difficulties  Cognition Arousal/Alertness: Awake/alert Behavior During Therapy: Flat affect Overall Cognitive Status: Within Functional Limits for tasks assessed                                        General Comments      Exercises     Assessment/Plan    PT Assessment Patient needs continued PT services  PT Problem List Decreased strength;Decreased mobility;Decreased balance;Impaired sensation;Cardiopulmonary status limiting activity;Decreased activity tolerance       PT Treatment Interventions DME instruction;Therapeutic exercise;Gait training;Balance training;Stair training;Therapeutic activities;Patient/family education    PT Goals (Current goals can be found in the Care Plan section)  Acute Rehab PT Goals Patient Stated Goal: go home PT Goal Formulation: With patient Time For Goal Achievement: 11/04/19 Potential to Achieve Goals: Good    Frequency Min 3X/week   Barriers to discharge        Co-evaluation               AM-PAC PT "6 Clicks" Mobility  Outcome Measure Help needed turning from your back to your side while in a flat bed without using bedrails?: None Help needed moving from lying on your back to sitting on the side of a flat bed without using bedrails?: None Help needed moving to and from a bed to a chair (including a wheelchair)?: A Little Help needed standing up from a chair using your arms (e.g., wheelchair or bedside chair)?: A Little Help needed to walk in hospital room?: A Little Help needed climbing 3-5 steps with a railing? : A Lot 6 Click Score: 19    End of Session Equipment Utilized During Treatment: Gait belt;Oxygen (2LO2) Activity Tolerance: Patient tolerated treatment well Patient left: in chair;with chair alarm set;with call bell/phone within reach    PT Visit Diagnosis: Muscle weakness (generalized) (M62.81);Other abnormalities of gait and mobility (R26.89)    Time:  -      Charges:             Livingston Diones, SPT, ATC

## 2019-10-21 NOTE — Progress Notes (Signed)
Hydrologist [facility])  Hospital Liaison: RN note         Notified by [facility] manager of patient/family request for [facility] Palliative services at home after discharge.         Left VM message for son to return call.          [facility] Palliative team will follow up with patient after discharge.         Please call with any hospice or palliative related questions.         Thank you for this referral.         Farrel Gordon, RN, CCM  [facility] (listed on [facility] under Hospice[facility])    [phone]

## 2019-10-21 NOTE — Progress Notes (Signed)
   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 In the ER patient was hypoxic to the 80s. Chest x-ray showed multifocal pneumonia, blood pressure was 254 systolic.She was admitted, started on antibiotics, diuretics, nitro, and supplemental oxygen  Palliative care was asked to see Donna May in the setting of an elevated mortality score.  Today's Discussion (10/21/2019): Chart reviewed. I met with Donna May this morning. She was in no distress. She endorses feeling much improved from a breathing perspective as compared to prior days.  We discussed her son and how she is proud of him as he is chief of police.   Donna May shares her frustrations with the visitation policy and the recent changes that have been instituted.  I asked Donna May if the had considered the MOST form we discussed yesterday. She shared that she had and was able to complete it today. MOST as below:  Cardiopulmonary Resuscitation: Do Not Attempt Resuscitation (DNR/No CPR)  Medical Interventions: Limited Additional Interventions: Use medical treatment, IV fluids and cardiac monitoring as indicated, DO NOT USE intubation or mechanical ventilation. May consider use of less invasive airway support such as BiPAP or CPAP. Also provide comfort measures. Transfer to the hospital if indicated. Avoid intensive care.   Antibiotics: Antibiotics if indicated  IV Fluids: IV fluids if indicated  Feeding Tube: No feeding tube   Patient states that she had prayer yesterday which she found to be helpful.   Discussed the importance of continued conversation with family and their  medical providers regarding overall plan of care and  treatment options, ensuring decisions are within the context of the patients values and GOCs.  Provided "Hard Choices for [employer]" booklet.   Questions and concerns addressed   SUMMARY OF RECOMMENDATIONS   DNAR/DNI  MOST Completed, paper copy placed onto the chart electric copy can be found in the [facility] section of epic  DNR Form Completed, paper copy placed onto the chart electric copy can be found in the [facility] section of epic  TOC --> Patient would benefit from OP Palliative Support and is in agreement with this referral  Time Spent: 25 Greater than 50% of the time was spent in counseling and coordination of care ______________________________________________________________________________________ [facility] Team Team Cell Phone: [phone] Please utilize secure chat with additional questions, if there is no response within 30 minutes please call the above phone number  Palliative Medicine Team providers are available by phone from 7am to 7pm daily and can be reached through the team cell phone.  Should this patient require assistance outside of these hours, please call the patient's attending physician.

## 2019-10-21 NOTE — Progress Notes (Signed)
PROGRESS NOTE    Donna May  WNU:[id number] DOB: 12-Nov-1940 DOA: 10/18/2019 PCP: [name], MD      Brief Narrative:  Donna May is a 79 y.o. 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  In the ER patient was hypoxic to the 80s. Chest x-ray showed multifocal pneumonia, blood pressure was 034 systolic.  She was admitted, started on antibiotics, diuretics, nitro, and supplemental oxygen.           Assessment & Plan:  Acute hypoxic respiratory failure Multifactorial, due to multifocal pneumonia, as well as congestive heart failure due to hypertensive emergency with flash pulmonary edema.  Improved rapidly the initial night with Lasix and nitroglycerin and blood pressure control.  Overnight last night had another blood pressure spike and dyspnea, improved again with blood pressure control and Lasix.  Still on oxygen   Multifocal pneumonia, left upper, lower lobes Procalcitonin 13, bacterial infection very likely.  Blood cultures negative, WBC normalized, no further fever -Continue ceftriaxone and azithromycin, day 4  Hypertensive emergency leading to acute congestive heart failure and flash pulmonary edema Patient admitted and started on Lasix and nitro, controlling blood pressure resolved dyspnea pulmonary edema.  Overnight second night, patient had another blood pressure spike, respiratory distress, and pulmonary edema, again resolved with Lasix.  Echocardiogram shows grade 2 diastolic dysfunction, speckled appearance of myocardium hard to explain, cholangiocarcinoma not associated with amyloid, and cardiac metastasis would be very rare.  We have weaned down her oxygen, but she still is hypoxic to 88% at rest or with ambulation -Continue furosemide 60 mg IV twice daily -Continue amlodipine, ARB,  metoprolol -Stop HCTZ -Continue potassium supplement -Strict I/Os, daily weights, telemetry  -Daily monitoring renal function    Type 2 diabetes, with nephropathy, without long-term insulin use Glucose normal -Continue aspirin, statin -Continue CIWA scale corrections -Continue sitagliptin as formulary alternative   CKD 3A Creatinine stable relative to baseline  Anemia Anemia due to chemotherapy.  Transfused 1 unit.  Hemoglobin up to 9 post transfusion.  Iron replete. Hemoglobin slightly down again today -Transfusion threshold 9 g/dL       Disposition: Status is: Inpatient  Remains inpatient appropriate because:IV treatments appropriate due to intensity of illness or inability to take PO   Dispo: The patient is from: Home              Anticipated d/c is to: Home              Anticipated d/c date is: 3 days              Patient currently is not medically stable to d/c.              MDM: The below labs and imaging reports were reviewed and summarized above.  Medication management as above.     DVT prophylaxis: enoxaparin (LOVENOX) injection 40 mg Start: 10/19/19 1000  Code Status: Full code Family Communication: Daughter and son at the bedside    Consultants:     Procedures:   Echocardiogram with normal EF, normal valves  Antimicrobials:   Ceftriaxone and azithromycin 8/4>>  Culture data:   8/5 Blood cultures x2 no growth          Subjective: Patient is gradually feeling better, she still has some orthopnea and some dyspnea with exertion, but this is improving.  Still on  oxygen, no fever vomiting or confusion  Objective: Vitals:   10/20/19 1705 10/20/19 2100 10/21/19 0417 10/21/19 0734  BP: (!) 157/66 (!) 157/69  (!) 179/82  Pulse: 66 70  79  Resp:  18  18  Temp: 98.3 F (36.8 C) 98.4 F (36.9 C)  98.7 F (37.1 C)  TempSrc:  Oral    SpO2: 100% 100%  100%  Weight:   62.2 kg   Height:        Intake/Output Summary (Last  24 hours) at 10/21/2019 1510 Last data filed at 10/21/2019 0800 Gross per 24 hour  Intake 240 ml  Output 1000 ml  Net -760 ml   Filed Weights   10/18/19 1834 10/20/19 0500 10/21/19 0417  Weight: 59 kg 58 kg 62.2 kg    Examination: General appearance: Adult female, sitting up in recliner, no acute distress HEENT:   Anicteric, conjunctival pink, lids and lashes normal.  No nasal deformity, discharge, or epistaxis, oropharynx moist, no oral lesions, hearing normal Skin: No lesions Cardiac: Regular rate and rhythm, no murmurs appreciated, JVP not visible, trace lower extremity edema Respiratory: Dyspneic with exertion, but respiratory rate normal at rest, nasal cannula in place, Rales at the bilateral bases, but improved from previous Abdomen:   MSK: Normal muscle bulk and tone Neuro: Awake and alert, extraocular movements intact, moves all extremities with normal coordination, generalized weakness, speech fluent Psych: Attention normal, affect normal, judgment insight appear normal      Data Reviewed: I have personally reviewed following labs and imaging studies:  CBC: Recent Labs  Lab 10/18/19 0830 10/18/19 0830 10/18/19 1944 10/19/19 0620 10/19/19 2026 10/20/19 0141 10/21/19 0628  WBC 9.3  --  19.1* 10.7*  --  10.6* 5.0  NEUTROABS 7.1  --  17.6*  --   --   --   --   HGB 7.0*  --  7.9* 6.3* 8.2* 9.4* 7.1*  HCT 22.4*   < > 25.7* 20.4* 25.1* 29.0* 22.6*  MCV 97.4  --  99.6 100.5*  --  94.2 96.6  PLT 297  --  379 331  --  423* 325   < > = values in this interval not displayed.   Basic Metabolic Panel: Recent Labs  Lab 10/18/19 0830 10/18/19 0830 10/18/19 1944 10/19/19 0536 10/19/19 0620 10/20/19 0141 10/21/19 0628  NA 141   < > 136 140 140 140 139  K 3.8   < > 4.0 4.0 4.1 3.8 3.3*  CL 110   < > 105 105 105 104 105  CO2 22   < > 21* 23 23 22 25   GLUCOSE 147*   < > 172* 136* 108* 190* 85  BUN 27*   < > 26* 29* 30* 31* 33*  CREATININE 1.33*  --  1.32* 1.33* 1.31*  1.22* 1.21*  CALCIUM 8.8*   < > 8.1* 8.1* 8.1* 8.7* 8.2*  MG 2.4  --   --   --   --   --   --    < > = values in this interval not displayed.   GFR: Estimated Creatinine Clearance: 33.4 mL/min (A) (by C-G formula based on SCr of 1.21 mg/dL (H)). Liver Function Tests: Recent Labs  Lab 10/18/19 0830 10/18/19 1944  AST 19 27  ALT 7 11  ALKPHOS 68 53  BILITOT 0.5 0.9  PROT 6.8 7.8  ALBUMIN 2.8* 3.1*   No results for input(s): LIPASE, AMYLASE in the last 168 hours. No results for input(s): AMMONIA  in the last 168 hours. Coagulation Profile: No results for input(s): INR, PROTIME in the last 168 hours. Cardiac Enzymes: No results for input(s): CKTOTAL, CKMB, CKMBINDEX, TROPONINI in the last 168 hours. BNP (last 3 results) No results for input(s): PROBNP in the last 8760 hours. HbA1C: Recent Labs    10/19/19 0019  HGBA1C 5.2   CBG: Recent Labs  Lab 10/20/19 1217 10/20/19 1703 10/20/19 2114 10/21/19 0816 10/21/19 1218  GLUCAP 99 128* 157* 78 108*   Lipid Profile: No results for input(s): CHOL, HDL, LDLCALC, TRIG, CHOLHDL, LDLDIRECT in the last 72 hours. Thyroid Function Tests: No results for input(s): TSH, T4TOTAL, FREET4, T3FREE, THYROIDAB in the last 72 hours. Anemia Panel: Recent Labs    10/20/19 0141  FERRITIN 1,449*  TIBC 214*  IRON 176*   Urine analysis: No results found for: COLORURINE, APPEARANCEUR, LABSPEC, PHURINE, GLUCOSEU, HGBUR, BILIRUBINUR, KETONESUR, PROTEINUR, UROBILINOGEN, NITRITE, LEUKOCYTESUR Sepsis Labs: @LABRCNTIP (procalcitonin:4,lacticacidven:4)  ) Recent Results (from the past 240 hour(s))  SARS Coronavirus 2 by RT PCR (hospital order, performed in [facility] hospital lab) Nasopharyngeal Nasopharyngeal Swab     Status: None   Collection Time: 10/18/19  8:24 PM   Specimen: Nasopharyngeal Swab  Result Value Ref Range Status   SARS Coronavirus 2 NEGATIVE NEGATIVE Final    Comment: (NOTE) SARS-CoV-2 target nucleic acids are NOT  DETECTED.  The SARS-CoV-2 RNA is generally detectable in upper and lower respiratory specimens during the acute phase of infection. The lowest concentration of SARS-CoV-2 viral copies this assay can detect is 250 copies / mL. A negative result does not preclude SARS-CoV-2 infection and should not be used as the sole basis for treatment or other patient management decisions.  A negative result may occur with improper specimen collection / handling, submission of specimen other than nasopharyngeal swab, presence of viral mutation(s) within the areas targeted by this assay, and inadequate number of viral copies (<250 copies / mL). A negative result must be combined with clinical observations, patient history, and epidemiological information.  Fact Sheet for Patients:   StrictlyIdeas.no  Fact Sheet for Healthcare Providers: BankingDealers.co.za  This test is not yet approved or  cleared by the [place] FDA and has been authorized for detection and/or diagnosis of SARS-CoV-2 by FDA under an Emergency Use Authorization (EUA).  This EUA will remain in effect (meaning this test can be used) for the duration of the COVID-19 declaration under Section 564(b)(1) of the Act, 21 U.S.C. section 360bbb-3(b)(1), unless the authorization is terminated or revoked sooner.  Performed at [facility], [place] [address], [place], [place] [address]   Culture, blood (routine x 2)     Status: None (Preliminary result)   Collection Time: 10/19/19 10:20 AM   Specimen: BLOOD LEFT ARM  Result Value Ref Range Status   Specimen Description BLOOD LEFT ARM  Final   Special Requests   Final    BOTTLES DRAWN AEROBIC AND ANAEROBIC Blood Culture adequate volume   Culture   Final    NO GROWTH 2 DAYS Performed at [facility], [place] [address], [place], [place] [address]    Report Status PENDING  Incomplete  Culture, blood (routine x 2)     Status: None  (Preliminary result)   Collection Time: 10/19/19 10:55 AM   Specimen: BLOOD LEFT ARM  Result Value Ref Range Status   Specimen Description BLOOD LEFT ARM  Final   Special Requests   Final    BOTTLES DRAWN AEROBIC AND ANAEROBIC Blood  Culture adequate volume   Culture   Final    NO GROWTH 2 DAYS Performed at [facility], [place] [address], [place], [place] [address]    Report Status PENDING  Incomplete  MRSA PCR Screening     Status: None   Collection Time: 10/19/19 10:21 PM   Specimen: Nasal Mucosa; Nasopharyngeal  Result Value Ref Range Status   MRSA by PCR NEGATIVE NEGATIVE Final    Comment:        The GeneXpert MRSA Assay (FDA approved for NASAL specimens only), is one component of a comprehensive MRSA colonization surveillance program. It is not intended to diagnose MRSA infection nor to guide or monitor treatment for MRSA infections. Performed at [facility], [place] [address] [place], [place] [address]          Radiology Studies: DG CHEST PORT 1 VIEW  Result Date: 10/20/2019 CLINICAL DATA:  Shortness of breath EXAM: PORTABLE CHEST 1 VIEW COMPARISON:  12/17/2015, 10/18/2019 FINDINGS: Left-sided central venous port tip over the SVC. Stable cardiomegaly. Slightly improved aeration of the right base. Asymmetric right greater than left ground-glass opacities and consolidations are otherwise unchanged. No pneumothorax. IMPRESSION: Slightly improved aeration at the right base. Otherwise no significant interval change in bilateral ground-glass opacities and consolidations. Similar cardiomegaly with vascular congestion. Electronically Signed   By: Donavan Foil M.D.   On: 10/20/2019 00:36        Scheduled Meds: . amLODipine  10 mg Oral Daily  . aspirin  81 mg Oral Daily  . chlorhexidine  15 mL Mouth Rinse BID  . Chlorhexidine Gluconate Cloth  6 each Topical Daily  . enoxaparin (LOVENOX) injection  40 mg Subcutaneous Q24H  . furosemide  60 mg Intravenous BID  .  insulin aspart  0-5 Units Subcutaneous QHS  . insulin aspart  0-9 Units Subcutaneous TID WC  . irbesartan  300 mg Oral Daily  . linagliptin  5 mg Oral Daily  . mouth rinse  15 mL Mouth Rinse q12n4p  . metoprolol succinate  100 mg Oral Daily  . pantoprazole  20 mg Oral Daily  . potassium chloride  20 mEq Oral BID  . sodium chloride flush  10-40 mL Intracatheter Q12H  . sodium chloride flush  3 mL Intravenous Q12H   Continuous Infusions: . sodium chloride    . azithromycin 500 mg (10/20/19 2325)  . cefTRIAXone (ROCEPHIN)  IV 1 g (10/21/19 0243)     LOS: 3 days    Time spent: 25 minutes    Edwin Dada, MD [facility] 10/21/2019, 3:10 PM     Please page though [place] or Epic secure chat:  For [employer], Adult nurse

## 2019-10-22 LAB — BPAM RBC
Blood Product Expiration Date: 202108232359
Blood Product Expiration Date: 202108232359
Unit Type and Rh: 6200
Unit Type and Rh: 6200

## 2019-10-22 LAB — TYPE AND SCREEN
ABO/RH(D): A POS
Antibody Screen: NEGATIVE
Unit division: 0
Unit division: 0

## 2019-10-22 LAB — BASIC METABOLIC PANEL
Anion gap: 11 (ref 5–15)
BUN: 39 mg/dL — ABNORMAL HIGH (ref 8–23)
CO2: 28 mmol/L (ref 22–32)
Calcium: 8.4 mg/dL — ABNORMAL LOW (ref 8.9–10.3)
Chloride: 104 mmol/L (ref 98–111)
Creatinine, Ser: 1.43 mg/dL — ABNORMAL HIGH (ref 0.44–1.00)
GFR calc Af Amer: 40 mL/min — ABNORMAL LOW (ref >60)
GFR calc non Af Amer: 35 mL/min — ABNORMAL LOW (ref >60)
Glucose, Bld: 93 mg/dL (ref 70–99)
Potassium: 3.8 mmol/L (ref 3.5–5.1)
Sodium: 143 mmol/L (ref 135–145)

## 2019-10-22 LAB — GLUCOSE, CAPILLARY
Glucose-Capillary: 82 mg/dL (ref 70–99)
Glucose-Capillary: 122 mg/dL — ABNORMAL HIGH (ref 70–99)

## 2019-10-22 MED ORDER — FUROSEMIDE 40 MG PO TABS
40.0000 mg | ORAL_TABLET | Freq: Every day | ORAL | 1 refills | Status: DC
Start: 2019-10-22 — End: 2019-11-22

## 2019-10-22 MED ORDER — HEPARIN SOD (PORK) LOCK FLUSH 100 UNIT/ML IV SOLN
500.0000 [IU] | INTRAVENOUS | Status: AC | PRN
  Administered 2019-10-22: 500 [IU]
  Filled 2019-10-22: qty 5

## 2019-10-22 MED ORDER — AMLODIPINE BESYLATE 10 MG PO TABS: 10.0000 mg | ORAL_TABLET | Freq: Every day | ORAL | 3 refills | Status: DC

## 2019-10-22 MED ORDER — AZITHROMYCIN 250 MG PO TABS
250.0000 mg | ORAL_TABLET | Freq: Every evening | ORAL | 0 refills | Status: DC
Start: 2019-10-22 — End: 2019-10-30

## 2019-10-22 MED ORDER — AZITHROMYCIN 250 MG PO TABS: 500.0000 mg | ORAL_TABLET | Freq: Every day | ORAL | Status: DC

## 2019-10-22 MED ORDER — METOPROLOL SUCCINATE ER 100 MG PO TB24: 100.0000 mg | ORAL_TABLET | Freq: Every day | ORAL | 1 refills | Status: DC

## 2019-10-22 MED ORDER — CEFDINIR 300 MG PO CAPS
300.0000 mg | ORAL_CAPSULE | Freq: Two times a day (BID) | ORAL | 0 refills | Status: DC
Start: 2019-10-22 — End: 2019-10-30

## 2019-10-22 MED ORDER — VALSARTAN 320 MG PO TABS: 320.0000 mg | ORAL_TABLET | Freq: Every day | ORAL | 3 refills | Status: DC

## 2019-10-22 MED ORDER — POTASSIUM CHLORIDE CRYS ER 10 MEQ PO TBCR: 10.0000 meq | EXTENDED_RELEASE_TABLET | Freq: Every day | ORAL | 1 refills | Status: DC

## 2019-10-22 NOTE — Progress Notes (Signed)
Patient has been asleep since mid-shift. She is responsive to voice and denies any pain and/or discomfort at this time. No acute distress noted.

## 2019-10-22 NOTE — Plan of Care (Signed)
progressing 

## 2019-10-22 NOTE — Discharge Summary (Signed)
Physician Discharge Summary  ROZLYN YERBY OIN:[id number] DOB: 02-Jun-1940 DOA: 10/18/2019  PCP: [name], MD  Admit date: 10/18/2019 Discharge date: 10/22/2019  Admitted From: Home  Disposition:  Home with [facility]   Recommendations for Outpatient Follow-up:  1. Follow up with PCP Dr. Volanda Napoleon tomorrow 2. Dr. Volanda Napoleon: Please obtain BMP Thursday and adjust Lasix as needed; attention to metformin if Cr rises on new Lasix 3. Dr. Volanda Napoleon or Dr. Burr Medico: Please check Hgb this week and transfuse as needed      Home Health: PT/OT due to SOB with exertion  Equipment/Devices: Walker  Discharge Condition: Fair  CODE STATUS: DNR Diet recommendation: Cardiac  Brief/Interim Summary: Mrs. Matte is a 79 y.o. 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  In the ER patient was hypoxic to the 80s. Chest x-ray showed multifocal pneumonia, blood pressure was 709 systolic.  She was admitted, started on antibiotics, diuretics, nitro, and supplemental oxygen.      PRINCIPAL HOSPITAL DIAGNOSIS: Acute hypoxic respiratory failure due to CHF    Discharge Diagnoses:   Acute hypoxic respiratory failure due to multifocal pneumonia and acute diastolic CHF Multifocal pneumonia, left upper, lower lobes Hypertensive emergency leading to acute diastolic congestive heart failure and flash pulmonary edema Patient admitted and started on antibiotics, antihypertensives, Lasix.  Improved rapidly the initial night with Lasix and nitroglycerin and blood pressure control.   Treated with empiric antibiotics, WBC improved, mentation good, taking orals, temp < 100 F, heart rate < 100bpm, RR < 24, SpO2 weaned to baseline with diuresis.   Echocardiogram showed grade 2 diastolic dysfunction, speckled appearance of myocardium hard to explain, cholangiocarcinoma not  associated with amyloid, and cardiac metastasis would be very rare.  No previous diagnosis of heart failure.  Patient was diuresed 2L and weaned to room air.  Able to ambulate without dyspnea.  Amlodipine, valsartan and metoprolol continued.  HCTZ changed to Lasix 40 mg once daily with K.  Salt restriction discussed.  Close PCP follow up recommended.   Type 2 diabetes, with nephropathy, without long-term insulin use New aspirin, statin, Metformin, sitagliptin     CKD IIIa  Anemia Anemia due to chemotherapy.  Transfused 1 unit.  Hemoglobin up to 9 post transfusion.  Iron replete.             Discharge Instructions  Discharge Instructions    Diet - low sodium heart healthy   Complete by: As directed    Discharge instructions   Complete by: As directed    From Dr. Loleta Books: You were admitted for trouble breathing.   This was partly due to infection and largely due to congestive heart and high blood pressure.  For the infection (pneumonia, infection in the lung): Finish antibiotics with azithromycin 250 mg nightly for two more nights starting tonight Sunday and also cefdinir 300 mg twice daily until gone, starting tonight  For the blood pressure and congestive heart: I have refilled all your normal home blood pressure medicines, amlodipine, valsartan and metoprolol with your mail order pharmacy.  Keep taking these without change. STOP taking HCTZ/hydrochlorothiazide and replace it with Lasix  Take furosemide/Lasix 40 mg once daily (before lunch) Take with a potassium supplement because lasix drops the potassium level  Weigh yourself every morning before breakfast If your weight ever increases by MORE than 3 lbs in a day or MORE than  5 lbs overall, call your primary care doctor  Go see your primary care doctor in 1 week for a labs check and also for adjustment of the Lasix if needed  Reduce salt in your diet, as salt in the diet can lead to fluid retention, high  blood pressure and water retention   Increase activity slowly   Complete by: As directed      Allergies as of 10/22/2019   No Known Allergies     Medication List    STOP taking these medications   DULoxetine 30 MG capsule Commonly known as: CYMBALTA   hydrochlorothiazide 25 MG tablet Commonly known as: HYDRODIURIL   HYDROcodone-acetaminophen 5-325 MG tablet Commonly known as: NORCO/VICODIN   ketoconazole 2 % cream Commonly known as: NIZORAL   ondansetron 8 MG tablet Commonly known as: ZOFRAN   prochlorperazine 10 MG tablet Commonly known as: COMPAZINE   raloxifene 60 MG tablet Commonly known as: EVISTA   rosuvastatin 20 MG tablet Commonly known as: CRESTOR   triamcinolone cream 0.1 % Commonly known as: KENALOG     TAKE these medications   amLODipine 10 MG tablet Commonly known as: NORVASC Take 1 tablet (10 mg total) by mouth daily.   aspirin 81 MG tablet Take 81 mg by mouth daily.   azithromycin 250 MG tablet Commonly known as: Zithromax Z-Pak Take 1 tablet (250 mg total) by mouth at bedtime.   Calcium Carbonate-Vitamin D 600-200 MG-UNIT Tabs Take 1 tablet by mouth daily.   CALCIUM-MAGNESIUM-VITAMIN D PO Take 1 tablet by mouth once a week.   cefdinir 300 MG capsule Commonly known as: OMNICEF Take 1 capsule (300 mg total) by mouth 2 (two) times daily.   furosemide 40 MG tablet Commonly known as: Lasix Take 1 tablet (40 mg total) by mouth daily.   glucose blood test strip 1 each by Other route 2 (two) times daily. Use Onetouch verio test strips as instructed to check blood sugar twice daily.   lansoprazole 15 MG capsule Commonly known as: PREVACID Take 1 capsule (15 mg total) by mouth daily.   lidocaine-prilocaine cream Commonly known as: EMLA Apply to affected area once What changed:   how much to take  how to take this  when to take this  reasons to take this  additional instructions   loratadine 10 MG tablet Commonly known as:  CLARITIN Take 10 mg by mouth daily as needed for allergies.   metFORMIN 500 MG 24 hr tablet Commonly known as: GLUCOPHAGE-XR Take 1 tablet (500 mg total) by mouth 2 (two) times daily before a meal. What changed: when to take this   metoprolol succinate 100 MG 24 hr tablet Commonly known as: TOPROL-XL Take 1 tablet (100 mg total) by mouth daily. Take with or immediately following a meal.   Omega 3 1000 MG Caps Take 2,000 mg by mouth daily.   potassium chloride 10 MEQ tablet Commonly known as: KLOR-CON Take 1 tablet (10 mEq total) by mouth daily.   sitaGLIPtin 100 MG tablet Commonly known as: Januvia Take 1 tablet (100 mg total) by mouth daily.   valsartan 320 MG tablet Commonly known as: DIOVAN Take 1 tablet (320 mg total) by mouth daily.            Durable Medical Equipment  (From admission, onward)         Start     Ordered   10/22/19 0949  For home use only DME Walker rolling  Once  Question Answer Comment  Walker: With [place]   Patient needs a walker to treat with the following condition Weakness      10/22/19 0949          Follow-up Information    [facility] Follow [facility].   Specialty: Home Health Services Why: The home health agency will contact you for the first home visit.       [name], MD. Schedule an appointment as soon as possible for a visit in 1 week(s).   Specialty: Family Medicine Contact information: [place] [address] [phone]              No Known Allergies  Consultations:  Hematology   Procedures/Studies: DG CHEST PORT 1 VIEW  Result Date: 10/20/2019 CLINICAL DATA:  Shortness of breath EXAM: PORTABLE CHEST 1 VIEW COMPARISON:  12/17/2015, 10/18/2019 FINDINGS: Left-sided central venous port tip over the SVC. Stable cardiomegaly. Slightly improved aeration of the right base. Asymmetric right greater than left ground-glass opacities and consolidations are  otherwise unchanged. No pneumothorax. IMPRESSION: Slightly improved aeration at the right base. Otherwise no significant interval change in bilateral ground-glass opacities and consolidations. Similar cardiomegaly with vascular congestion. Electronically Signed   By: Donavan Foil M.D.   On: 10/20/2019 00:36   DG Chest Portable 1 View  Result Date: 10/18/2019 CLINICAL DATA:  Shortness of breath. EXAM: PORTABLE CHEST 1 VIEW COMPARISON:  December 02, 2018 FINDINGS: LEFT-sided Port-A-Cath terminates at distal aspect of the superior vena cava. Heart size is stable. Hilar structures are partially obscured by diffuse interstitial and airspace disease worse in the RIGHT chest and worse in the RIGHT lower lobe. No sign of pleural effusion. Near confluent disease partially obscures the RIGHT hemidiaphragm. RIGHT heart border is distinct. Visualized skeletal structures on limited assessment without acute process. IMPRESSION: 1. Diffuse interstitial and airspace disease worse in the RIGHT chest and worse in the RIGHT lower lobe. 2. Findings above could represent and are most suspicious for multifocal pneumonia. Asymmetric pulmonary edema and lymphangitic tumor are considered. Laboratory correlation may be helpful for any signs of heart failure with chest CT as warranted. Electronically Signed   By: Zetta Bills M.D.   On: 10/18/2019 19:12   ECHOCARDIOGRAM COMPLETE  Result Date: 10/19/2019    ECHOCARDIOGRAM REPORT   Patient Name:   KYNZLEE HUCKER Date of Exam: 10/19/2019 Medical Rec #:  [id number]     Height:       65.0 in Accession #:    [id number]    Weight:       130.0 lb Date of Birth:  05/14/1940    BSA:          1.647 m Patient Age:    79 years      BP:           165/75 mmHg Patient Gender: F             HR:           75 bpm. Exam Location:  Inpatient Procedure: 2D Echo and Strain Analysis Indications:  History:        Patient has prior history of Echocardiogram examinations, most                 recent 12/12/2012.  Signs/Symptoms:Murmur; Risk                 Factors:Hypertension, Diabetes and Dyslipidemia. History of  liver and breast cancer. GERD.  Sonographer:    Darlina Sicilian RDCS Referring Phys: [id number] [place]  1. Normal LV systolic function; moderate LVH; grade 2 diastolic dysfunction; myocardium with speckled appearance; consider amyloid; moderate LAE.  2. Left ventricular ejection fraction, by estimation, is 55 to 60%. The left ventricle has normal function. The left ventricle has no regional wall motion abnormalities. There is moderate left ventricular hypertrophy. Left ventricular diastolic parameters are consistent with Grade II diastolic dysfunction (pseudonormalization). Elevated left atrial pressure.  3. Right ventricular systolic function is normal. The right ventricular size is normal. There is moderately elevated pulmonary artery systolic pressure.  4. Left atrial size was moderately dilated.  5. The mitral valve is normal in structure. Trivial mitral valve regurgitation. No evidence of mitral stenosis.  6. The aortic valve is tricuspid. Aortic valve regurgitation is not visualized. No aortic stenosis is present.  7. The inferior vena cava is normal in size with greater than 50% respiratory variability, suggesting right atrial pressure of 3 mmHg. FINDINGS  Left Ventricle: Left ventricular ejection fraction, by estimation, is 55 to 60%. The left ventricle has normal function. The left ventricle has no regional wall motion abnormalities. The left ventricular internal cavity size was normal in size. There is  moderate left ventricular hypertrophy. Left ventricular diastolic parameters are consistent with Grade II diastolic dysfunction (pseudonormalization). Elevated left atrial pressure. Right Ventricle: The right ventricular size is normal.Right ventricular systolic function is normal. There is moderately elevated pulmonary artery systolic pressure. The tricuspid regurgitant  velocity is 3.70 m/s, and with an assumed right atrial pressure of 3 mmHg, the estimated right ventricular systolic pressure is 10.9 mmHg. Left Atrium: Left atrial size was moderately dilated. Right Atrium: Right atrial size was normal in size. Pericardium: Trivial pericardial effusion is present. Mitral Valve: The mitral valve is normal in structure. Normal mobility of the mitral valve leaflets. Trivial mitral valve regurgitation. No evidence of mitral valve stenosis. Tricuspid Valve: The tricuspid valve is normal in structure. Tricuspid valve regurgitation is mild . No evidence of tricuspid stenosis. Aortic Valve: The aortic valve is tricuspid. Aortic valve regurgitation is not visualized. No aortic stenosis is present. Pulmonic Valve: The pulmonic valve was normal in structure. Pulmonic valve regurgitation is trivial. No evidence of pulmonic stenosis. Aorta: The aortic root is normal in size and structure. Venous: The inferior vena cava is normal in size with greater than 50% respiratory variability, suggesting right atrial pressure of 3 mmHg. IAS/Shunts: No atrial level shunt detected by color flow Doppler. Additional Comments: Normal LV systolic function; moderate LVH; grade 2 diastolic dysfunction; myocardium with speckled appearance; consider amyloid; moderate LAE.  LEFT VENTRICLE PLAX 2D LVIDd:         4.10 cm      Diastology LVIDs:         3.20 cm      LV e' lateral:   8.27 cm/s LV PW:         1.50 cm      LV E/e' lateral: 10.7 LV IVS:        1.40 cm      LV e' medial:    4.57 cm/s LVOT diam:     1.90 cm      LV E/e' medial:  19.3 LV SV:         56 LV SV Index:   34 LVOT Area:     2.84 cm  LV Volumes (MOD) LV vol d, MOD A2C: 106.0 ml LV  vol d, MOD A4C: 111.0 ml LV vol s, MOD A2C: 42.9 ml LV vol s, MOD A4C: 65.0 ml LV SV MOD A2C:     63.1 ml LV SV MOD A4C:     111.0 ml LV SV MOD BP:      54.9 ml RIGHT VENTRICLE RV S prime:     15.80 cm/s TAPSE (M-mode): 2.3 cm LEFT ATRIUM             Index       RIGHT  ATRIUM           Index LA diam:        3.90 cm 2.37 cm/m  RA Area:     16.90 cm LA Vol (A2C):   72.1 ml 43.77 ml/m RA Volume:   44.10 ml  26.77 ml/m LA Vol (A4C):   80.8 ml 49.05 ml/m LA Biplane Vol: 77.3 ml 46.93 ml/m  AORTIC VALVE LVOT Vmax:   96.60 cm/s LVOT Vmean:  69.100 cm/s LVOT VTI:    0.197 m  AORTA Ao Root diam: 2.80 cm MITRAL VALVE               TRICUSPID VALVE MV Area (PHT): 5.66 cm    TR Peak grad:   54.8 mmHg MV Decel Time: 134 msec    TR Vmax:        370.00 cm/s MV E velocity: 88.30 cm/s MV A velocity: 45.40 cm/s  SHUNTS MV E/A ratio:  1.94        Systemic VTI:  0.20 m                            Systemic Diam: 1.90 cm Kirk Ruths MD Electronically signed by Kirk Ruths MD Signature Date/Time: 10/19/2019/12:52:52 PM    Final       Subjective: Patient without dyspnea on exertion, swelling, orthopnea.  No fever, sputum production.  No chills.  Discharge Exam: Vitals:   10/21/19 2230 10/22/19 0754  BP: 134/61 (!) 151/71  Pulse: 68 72  Resp: 17 16  Temp: 99 F (37.2 C) 98.7 F (37.1 C)  SpO2: 100% 100%   Vitals:   10/21/19 1620 10/21/19 2230 10/22/19 0300 10/22/19 0754  BP: (!) 155/64 134/61  (!) 151/71  Pulse: 62 68  72  Resp: 18 17  16   Temp: 98.8 F (37.1 C) 99 F (37.2 C)  98.7 F (37.1 C)  TempSrc:  Oral  Oral  SpO2: 98% 100%  100%  Weight:   62.1 kg   Height:        General: Pt is alert, awake, not in acute distress Cardiovascular: RRR, nl S1-S2, no murmurs appreciated.   No LE edema.   Respiratory: Normal respiratory rate and rhythm.  Some crackles just at the bases, no wheezing.  No respiratory distress. Abdominal: Abdomen soft and non-tender.  No distension or HSM.   Neuro/Psych: Strength symmetric in upper and lower extremities.  Judgment and insight appear normal.   The results of significant diagnostics from this hospitalization (including imaging, microbiology, ancillary and laboratory) are listed below for reference.      Microbiology: Recent Results (from the past 240 hour(s))  SARS Coronavirus 2 by RT PCR (hospital order, performed in [facility] hospital lab) Nasopharyngeal Nasopharyngeal Swab     Status: None   Collection Time: 10/18/19  8:24 PM   Specimen: Nasopharyngeal Swab  Result Value Ref Range Status   SARS Coronavirus 2 NEGATIVE  NEGATIVE Final    Comment: (NOTE) SARS-CoV-2 target nucleic acids are NOT DETECTED.  The SARS-CoV-2 RNA is generally detectable in upper and lower respiratory specimens during the acute phase of infection. The lowest concentration of SARS-CoV-2 viral copies this assay can detect is 250 copies / mL. A negative result does not preclude SARS-CoV-2 infection and should not be used as the sole basis for treatment or other patient management decisions.  A negative result may occur with improper specimen collection / handling, submission of specimen other than nasopharyngeal swab, presence of viral mutation(s) within the areas targeted by this assay, and inadequate number of viral copies (<250 copies / mL). A negative result must be combined with clinical observations, patient history, and epidemiological information.  Fact Sheet for Patients:   StrictlyIdeas.no  Fact Sheet for Healthcare Providers: BankingDealers.co.za  This test is not yet approved or  cleared by the [place] FDA and has been authorized for detection and/or diagnosis of SARS-CoV-2 by FDA under an Emergency Use Authorization (EUA).  This EUA will remain in effect (meaning this test can be used) for the duration of the COVID-19 declaration under Section 564(b)(1) of the Act, 21 U.S.C. section 360bbb-3(b)(1), unless the authorization is terminated or revoked sooner.  Performed at [facility], [place] [address], [place], [place] [address]   Culture, blood (routine x 2)     Status: None (Preliminary result)   Collection Time: 10/19/19 10:20  AM   Specimen: BLOOD LEFT ARM  Result Value Ref Range Status   Specimen Description BLOOD LEFT ARM  Final   Special Requests   Final    BOTTLES DRAWN AEROBIC AND ANAEROBIC Blood Culture adequate volume   Culture   Final    NO GROWTH 3 DAYS Performed at [facility], [address], [place], [place] [address]    Report Status PENDING  Incomplete  Culture, blood (routine x 2)     Status: None (Preliminary result)   Collection Time: 10/19/19 10:55 AM   Specimen: BLOOD LEFT ARM  Result Value Ref Range Status   Specimen Description BLOOD LEFT ARM  Final   Special Requests   Final    BOTTLES DRAWN AEROBIC AND ANAEROBIC Blood Culture adequate volume   Culture   Final    NO GROWTH 3 DAYS Performed at [facility], [place] [address], [place],  [address]    Report Status PENDING  Incomplete  MRSA PCR Screening     Status: None   Collection Time: 10/19/19 10:21 PM   Specimen: Nasal Mucosa; Nasopharyngeal  Result Value Ref Range Status   MRSA by PCR NEGATIVE NEGATIVE Final    Comment:        The GeneXpert MRSA Assay (FDA approved for NASAL specimens only), is one component of a comprehensive MRSA colonization surveillance program. It is not intended to diagnose MRSA infection nor to guide or monitor treatment for MRSA infections. Performed at [name], [place] [address], [place],  [address]      Labs: BNP (last 3 results) Recent Labs    10/19/19 0019  BNP 4,196.2*   Basic Metabolic Panel: Recent Labs  Lab 10/18/19 0830 10/18/19 1944 10/19/19 0536 10/19/19 0620 10/20/19 0141 10/21/19 0628 10/22/19 0700  NA 141   < > 140 140 140 139 143  K 3.8   < > 4.0 4.1 3.8 3.3* 3.8  CL 110   < > 105 105 104 105 104  CO2 22   < >  23 23 22 25 28   GLUCOSE 147*   < > 136* 108* 190* 85 93  BUN 27*   < > 29* 30* 31* 33* 39*  CREATININE 1.33*   < > 1.33* 1.31* 1.22* 1.21* 1.43*  CALCIUM 8.8*   < > 8.1* 8.1* 8.7* 8.2* 8.4*  MG 2.4  --   --   --   --   --    --    < > = values in this interval not displayed.   Liver Function Tests: Recent Labs  Lab 10/18/19 0830 10/18/19 1944  AST 19 27  ALT 7 11  ALKPHOS 68 53  BILITOT 0.5 0.9  PROT 6.8 7.8  ALBUMIN 2.8* 3.1*   No results for input(s): LIPASE, AMYLASE in the last 168 hours. No results for input(s): AMMONIA in the last 168 hours. CBC: Recent Labs  Lab 10/18/19 0830 10/18/19 0830 10/18/19 1944 10/19/19 0620 10/19/19 2026 10/20/19 0141 10/21/19 0628  WBC 9.3  --  19.1* 10.7*  --  10.6* 5.0  NEUTROABS 7.1  --  17.6*  --   --   --   --   HGB 7.0*  --  7.9* 6.3* 8.2* 9.4* 7.1*  HCT 22.4*   < > 25.7* 20.4* 25.1* 29.0* 22.6*  MCV 97.4  --  99.6 100.5*  --  94.2 96.6  PLT 297  --  379 331  --  423* 325   < > = values in this interval not displayed.   Cardiac Enzymes: No results for input(s): CKTOTAL, CKMB, CKMBINDEX, TROPONINI in the last 168 hours. BNP: Invalid input(s): POCBNP CBG: Recent Labs  Lab 10/21/19 1218 10/21/19 1623 10/21/19 2131 10/22/19 0756 10/22/19 1156  GLUCAP 108* 91 130* 82 122*   D-Dimer No results for input(s): DDIMER in the last 72 hours. Hgb A1c No results for input(s): HGBA1C in the last 72 hours. Lipid Profile No results for input(s): CHOL, HDL, LDLCALC, TRIG, CHOLHDL, LDLDIRECT in the last 72 hours. Thyroid function studies No results for input(s): TSH, T4TOTAL, T3FREE, THYROIDAB in the last 72 hours.  Invalid input(s): FREET3 Anemia work up Recent Labs    10/20/19 0141  FERRITIN 1,449*  TIBC 214*  IRON 176*   Urinalysis No results found for: COLORURINE, APPEARANCEUR, LABSPEC, [place], GLUCOSEU, [place], [place], [place], PROTEINUR, UROBILINOGEN, NITRITE, LEUKOCYTESUR Sepsis Labs Invalid input(s): PROCALCITONIN,  WBC,  LACTICIDVEN Microbiology Recent Results (from the past 240 hour(s))  SARS Coronavirus 2 by RT PCR (hospital order, performed in [facility] hospital lab) Nasopharyngeal Nasopharyngeal Swab     Status: None    Collection Time: 10/18/19  8:24 PM   Specimen: Nasopharyngeal Swab  Result Value Ref Range Status   SARS Coronavirus 2 NEGATIVE NEGATIVE Final    Comment: (NOTE) SARS-CoV-2 target nucleic acids are NOT DETECTED.  The SARS-CoV-2 RNA is generally detectable in upper and lower respiratory specimens during the acute phase of infection. The lowest concentration of SARS-CoV-2 viral copies this assay can detect is 250 copies / mL. A negative result does not preclude SARS-CoV-2 infection and should not be used as the sole basis for treatment or other patient management decisions.  A negative result may occur with improper specimen collection / handling, submission of specimen other than nasopharyngeal swab, presence of viral mutation(s) within the areas targeted by this assay, and inadequate number of viral copies (<250 copies / mL). A negative result must be combined with clinical observations, patient history, and epidemiological information.  Fact Sheet for Patients:  StrictlyIdeas.no  Fact Sheet for Healthcare Providers: BankingDealers.co.za  This test is not yet approved or  cleared by the [place] FDA and has been authorized for detection and/or diagnosis of SARS-CoV-2 by FDA under an Emergency Use Authorization (EUA).  This EUA will remain in effect (meaning this test can be used) for the duration of the COVID-19 declaration under Section 564(b)(1) of the Act, 21 U.S.C. section 360bbb-3(b)(1), unless the authorization is terminated or revoked sooner.  Performed at [facility], [place] [address], [place], [place] [address]   Culture, blood (routine x 2)     Status: None (Preliminary result)   Collection Time: 10/19/19 10:20 AM   Specimen: BLOOD LEFT ARM  Result Value Ref Range Status   Specimen Description BLOOD LEFT ARM  Final   Special Requests   Final    BOTTLES DRAWN AEROBIC AND ANAEROBIC Blood Culture adequate volume    Culture   Final    NO GROWTH 3 DAYS Performed at [facility], [address], [place], [place] [address]    Report Status PENDING  Incomplete  Culture, blood (routine x 2)     Status: None (Preliminary result)   Collection Time: 10/19/19 10:55 AM   Specimen: BLOOD LEFT ARM  Result Value Ref Range Status   Specimen Description BLOOD LEFT ARM  Final   Special Requests   Final    BOTTLES DRAWN AEROBIC AND ANAEROBIC Blood Culture adequate volume   Culture   Final    NO GROWTH 3 DAYS Performed at [facility], [place] [address], [place], [place] [address]    Report Status PENDING  Incomplete  MRSA PCR Screening     Status: None   Collection Time: 10/19/19 10:21 PM   Specimen: Nasal Mucosa; Nasopharyngeal  Result Value Ref Range Status   MRSA by PCR NEGATIVE NEGATIVE Final    Comment:        The GeneXpert MRSA Assay (FDA approved for NASAL specimens only), is one component of a comprehensive MRSA colonization surveillance program. It is not intended to diagnose MRSA infection nor to guide or monitor treatment for MRSA infections. Performed at [facility], [place] [address] [address], [place],  [address]      Time coordinating discharge: 45 minutes The  controlled substances registry was reviewed for this patient        SIGNED:   Edwin Dada, MD  [facility] 10/22/2019, 4:11 PM

## 2019-10-22 NOTE — TOC Transition Note (Signed)
Transition of Care [facility]) - CM/SW Discharge Note   Patient Details  Name: STELLAR GENSEL MRN: [id number] Date of Birth: 27-Apr-1940  Transition of Care [facility] CM/SW Contact:  Pollie Friar, RN Phone Number: 10/22/2019, 11:39 AM   Clinical Narrative:    Pt discharging home with [facility] services through [facility]. Jason with [facility] aware of d/c home today.  Pt has already been set up with [facility] for palliative care.  [place] for home to be delivered to the room per [facility].  Pt states she has people to check in on her at home and transportation to home.    Final next level of care: [place] Barriers to Discharge: No Barriers Identified   Patient Goals and CMS Choice Patient states their goals for this hospitalization and ongoing recovery are:: to go home CMS Medicare.gov Compare Post Acute Care list provided to:: Patient Choice offered to / list presented to : Patient  Discharge Placement                       Discharge Plan and Services   Discharge Planning Services: CM Consult            DME Arranged: Gilford Rile rolling DME Agency: [facility] Date DME Agency Contacted: 10/22/19   Representative spoke with at DME Agency: [place]: PT, OT [place] Agency: [place] ([place]) Date [place]: 10/22/19   Representative spoke with at [place]: [place] ([place]) Interventions     Readmission Risk Interventions No flowsheet data found.

## 2019-10-22 NOTE — Progress Notes (Signed)
SATURATION QUALIFICATIONS: (This note is used to comply with regulatory documentation for home oxygen) ° °Patient Saturations on Room Air at Rest = 99% ° °Patient Saturations on Room Air while Ambulating = 96% ° °

## 2019-10-23 ENCOUNTER — Encounter: Payer: Self-pay | Admitting: Family Medicine

## 2019-10-23 ENCOUNTER — Ambulatory Visit (INDEPENDENT_AMBULATORY_CARE_PROVIDER_SITE_OTHER): Payer: Medicare Other | Admitting: Family Medicine

## 2019-10-23 ENCOUNTER — Other Ambulatory Visit: Payer: Self-pay

## 2019-10-23 VITALS — BP 148/80 | HR 67 | Temp 97.9°F | Wt 126.0 lb

## 2019-10-23 DIAGNOSIS — J189 Pneumonia, unspecified organism: Secondary | ICD-10-CM

## 2019-10-23 DIAGNOSIS — D638 Anemia in other chronic diseases classified elsewhere: Secondary | ICD-10-CM

## 2019-10-23 DIAGNOSIS — E876 Hypokalemia: Secondary | ICD-10-CM | POA: Diagnosis not present

## 2019-10-23 DIAGNOSIS — I5031 Acute diastolic (congestive) heart failure: Secondary | ICD-10-CM | POA: Diagnosis not present

## 2019-10-23 DIAGNOSIS — E1121 Type 2 diabetes mellitus with diabetic nephropathy: Secondary | ICD-10-CM | POA: Diagnosis not present

## 2019-10-23 DIAGNOSIS — E1122 Type 2 diabetes mellitus with diabetic chronic kidney disease: Secondary | ICD-10-CM

## 2019-10-23 DIAGNOSIS — N1832 Chronic kidney disease, stage 3b: Secondary | ICD-10-CM | POA: Diagnosis not present

## 2019-10-23 NOTE — Progress Notes (Signed)
Subjective:    Patient ID: Donna May, female    DOB: 14-Jul-1940, 79 y.o.   MRN: [id number]  No chief complaint on file. Pt accompanied by her daughter.  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  Pt admitted 8/4-8/8 for pneumonia causing hypoxic respiratory failure and hypertensive emergency causing flash pulmonary edema and acute diastolic CHF.  ECHO with grade 2 diastolic dysfunction.  Pt started on lasix, antihypertensives and abx.  Diuresed 2 L.  Pt also noted to have anemia 2/2 chemotherapy.  Prior to hospitalization pt was scheduled to have outpatient blood transfusion.  Pt received 1 unit PRBC in hospital, hgb increased to 9.  Since d/c yesterday, pt taking cefdinir and azithromycin for pneumonia.  Pt notes breathing has improved.  Endorses an occasional cough.  Pt feels a little tired.  Inquires about changes made to medications.  States started on several meds for DM, but her A1C has been good.  Past Medical History:  Diagnosis Date  . Anemia   . Anxiety   . Arthritis   . Breast cancer ([place]) 1992  . Cataract    Bilateral eyes - surgery to remove  . Chronic kidney disease    CKD stage 3  . Complication of anesthesia    "something they use make me itch for a couple of days."  . Depression   . Diabetes mellitus    type 2  . Duodenitis 01/18/2002  . Fainting spell   . Family history of breast cancer   . Family history of prostate cancer   . GERD (gastroesophageal reflux disease)   . Heart murmur    never has caused any problems  . Hiatal hernia 08/08/2008, 01/18/2002  . History of pneumonia    x 2  . Hyperlipidemia   . Hypertension   . Liver cancer ([place]) 08/2018  . Lymphedema 2017   Right arm  . Pneumonia    x 2  . UTI (lower urinary tract infection)     No Known Allergies  ROS General: Denies fever, chills,  night sweats, changes in weight, changes in appetite  +fatigue HEENT: Denies headaches, ear pain, changes in vision, rhinorrhea, sore throat CV: Denies CP, palpitations, SOB, orthopnea Pulm: Denies SOB, cough, wheezing  +mild cough GI: Denies abdominal pain, nausea, vomiting, diarrhea, constipation GU: Denies dysuria, hematuria, frequency, vaginal discharge Msk: Denies muscle cramps, joint pains Neuro: Denies weakness, numbness, tingling Skin: Denies rashes, bruising Psych: Denies depression, anxiety, hallucinations    Objective:    Blood pressure (!) 148/80, pulse 67, temperature 97.9 F (36.6 C), temperature source Oral, weight 126 lb (57.2 kg), SpO2 99 %.  Gen. Pleasant, well-nourished, in no distress, normal affect   HEENT: [place]/AT, face symmetric, conjunctiva clear, no scleral icterus, PERRLA, EOMI, nares patent without drainage Lungs: no accessory muscle use, CTAB, no wheezes or rales Cardiovascular: RRR, no m/r/g, no peripheral edema. Musculoskeletal: No deformities, no cyanosis or clubbing, normal tone Neuro:  A&Ox3, CN II-XII intact, ambulating with a cane Skin:  Warm, no lesions/ rash   Wt Readings from Last 3 Encounters:  10/22/19 136 lb 14.5 oz (62.1 kg)  10/18/19 130 lb 11.2 oz (59.3 kg)  10/04/19 129 lb 6.4 oz (58.7 kg)    Lab Results  Component Value Date   WBC 5.0 10/21/2019   HGB 7.1 (L) 10/21/2019  HCT 22.6 (L) 10/21/2019   PLT 325 10/21/2019   GLUCOSE 93 10/22/2019   CHOL 190 09/01/2018   TRIG 46.0 09/01/2018   HDL 60.50 09/01/2018   LDLCALC 120 (H) 09/01/2018   ALT 11 10/18/2019   AST 27 10/18/2019   NA 143 10/22/2019   K 3.8 10/22/2019   CL 104 10/22/2019   CREATININE 1.43 (H) 10/22/2019   BUN 39 (H) 10/22/2019   CO2 28 10/22/2019   TSH 1.48 04/26/2017   INR 1.1 11/09/2018   HGBA1C 5.2 10/19/2019   MICROALBUR 0.9 09/01/2018    Assessment/Plan:  Hypokalemia  -potassium 3.8 on 10/22/2019, was 3.3 on 10/21/2019 -continue Klor-Con 10 mEq  daily - Plan: BMP with eGFR([facility])  Anemia in other chronic diseases classified elsewhere  -2/2 chemotherapy for intrahepatic cholangiocarcinoma -H&H initially 6.3 /20.4 on 10/19/19 in hospital.  S/p 1 u pRBCs H&H 7.1/22.6 on 10/21/2019 -will obtain labs.  May need additional unit of pRBCs -continue follow-up with oncology, Dr. Burr Medico - Plan: CBC  Acute diastolic (congestive) heart failure ([place]) -new dx 2/2 hypertensive emergency, flash pulmonary edema -echo on 10/19/19 with grade 2 diastolic dysfunction, EF 21-03%.  Moderate LV hypertrophy moderately elevated pulmonary artery pressure, moderately dilated LA -continue lasix 40 mg daily, Klor-Con 10 mEq daily, Toprol XL 100 mg, Norvasc 10 mg, and valsartan 320 mg daily -patient encouraged to monitor sodium and fluid intake. -Will obtain BMP.  Will adjust lasix as needed based on results.  Pneumonia due to infectious organism, unspecified laterality, unspecified part of lu//ng -CXR on 10/18/2019 with diffuse interstitial airspace disease course and right chest and right lower lobe.  Suspicious for multifocal pneumonia.  Asymmetric pulmonary edema and lymphangitic tumor are considered. -Continue azithromycin and cefdinir -given precautions for worsening symptoms  Type 2 diabetes with stage IIIb chronic kidney disease, without long-term use of insulin (HCC) -last hemoglobin A1c 5.2% on 10/19/2019 -currently taking Metformin XR 500 mg daily, Januvia 100 mg daily -discussed adjusting medications as diabetes well controlled. -will obtain BMP, if renal function not improving d/c Metformin XR 500 mg daily.  F/u prn   Grier Mitts, MD

## 2019-10-24 LAB — CBC
HCT: 23.2 % — ABNORMAL LOW (ref 35.0–45.0)
Hemoglobin: 7.6 g/dL — ABNORMAL LOW (ref 11.7–15.5)
MCH: 30.8 pg (ref 27.0–33.0)
MCHC: 32.8 g/dL (ref 32.0–36.0)
MCV: 93.9 fL (ref 80.0–100.0)
MPV: 9.7 fL (ref 7.5–12.5)
Platelets: 371 10*3/uL (ref 140–400)
RBC: 2.47 10*6/uL — ABNORMAL LOW (ref 3.80–5.10)
RDW: 15.7 % — ABNORMAL HIGH (ref 11.0–15.0)
WBC: 5 10*3/uL (ref 3.8–10.8)

## 2019-10-24 LAB — BASIC METABOLIC PANEL WITH GFR
BUN/Creatinine Ratio: 28 (calc) — ABNORMAL HIGH (ref 6–22)
BUN: 36 mg/dL — ABNORMAL HIGH (ref 7–25)
CO2: 27 mmol/L (ref 20–32)
Calcium: 8.7 mg/dL (ref 8.6–10.4)
Chloride: 107 mmol/L (ref 98–110)
Creat: 1.3 mg/dL — ABNORMAL HIGH (ref 0.60–0.88)
GFR, Est African American: 45 mL/min/{1.73_m2} — ABNORMAL LOW (ref >=60)
GFR, Est Non African American: 39 mL/min/{1.73_m2} — ABNORMAL LOW (ref >=60)
Glucose, Bld: 96 mg/dL (ref 65–99)
Potassium: 4.2 mmol/L (ref 3.5–5.3)
Sodium: 142 mmol/L (ref 135–146)

## 2019-10-24 LAB — CULTURE, BLOOD (ROUTINE X 2)
Culture: NO GROWTH
Culture: NO GROWTH
Special Requests: ADEQUATE
Special Requests: ADEQUATE

## 2019-10-25 ENCOUNTER — Telehealth: Payer: Self-pay | Admitting: Family Medicine

## 2019-10-25 ENCOUNTER — Telehealth: Payer: Self-pay

## 2019-10-25 DIAGNOSIS — I972 Postmastectomy lymphedema syndrome: Secondary | ICD-10-CM | POA: Diagnosis not present

## 2019-10-25 DIAGNOSIS — I701 Atherosclerosis of renal artery: Secondary | ICD-10-CM | POA: Diagnosis not present

## 2019-10-25 DIAGNOSIS — E785 Hyperlipidemia, unspecified: Secondary | ICD-10-CM | POA: Diagnosis not present

## 2019-10-25 DIAGNOSIS — K2980 Duodenitis without bleeding: Secondary | ICD-10-CM | POA: Diagnosis not present

## 2019-10-25 DIAGNOSIS — N1831 Chronic kidney disease, stage 3a: Secondary | ICD-10-CM | POA: Diagnosis not present

## 2019-10-25 DIAGNOSIS — I130 Hypertensive heart and chronic kidney disease with heart failure and stage 1 through stage 4 chronic kidney disease, or unspecified chronic kidney disease: Secondary | ICD-10-CM | POA: Diagnosis not present

## 2019-10-25 DIAGNOSIS — E1122 Type 2 diabetes mellitus with diabetic chronic kidney disease: Secondary | ICD-10-CM | POA: Diagnosis not present

## 2019-10-25 DIAGNOSIS — C221 Intrahepatic bile duct carcinoma: Secondary | ICD-10-CM | POA: Diagnosis not present

## 2019-10-25 DIAGNOSIS — K219 Gastro-esophageal reflux disease without esophagitis: Secondary | ICD-10-CM | POA: Diagnosis not present

## 2019-10-25 DIAGNOSIS — Z853 Personal history of malignant neoplasm of breast: Secondary | ICD-10-CM | POA: Diagnosis not present

## 2019-10-25 DIAGNOSIS — I5031 Acute diastolic (congestive) heart failure: Secondary | ICD-10-CM | POA: Diagnosis not present

## 2019-10-25 DIAGNOSIS — E1151 Type 2 diabetes mellitus with diabetic peripheral angiopathy without gangrene: Secondary | ICD-10-CM | POA: Diagnosis not present

## 2019-10-25 DIAGNOSIS — J9601 Acute respiratory failure with hypoxia: Secondary | ICD-10-CM | POA: Diagnosis not present

## 2019-10-25 DIAGNOSIS — J189 Pneumonia, unspecified organism: Secondary | ICD-10-CM | POA: Diagnosis not present

## 2019-10-25 DIAGNOSIS — D631 Anemia in chronic kidney disease: Secondary | ICD-10-CM | POA: Diagnosis not present

## 2019-10-25 DIAGNOSIS — F329 Major depressive disorder, single episode, unspecified: Secondary | ICD-10-CM | POA: Diagnosis not present

## 2019-10-25 DIAGNOSIS — Z9011 Acquired absence of right breast and nipple: Secondary | ICD-10-CM | POA: Diagnosis not present

## 2019-10-25 DIAGNOSIS — K449 Diaphragmatic hernia without obstruction or gangrene: Secondary | ICD-10-CM | POA: Diagnosis not present

## 2019-10-25 DIAGNOSIS — F419 Anxiety disorder, unspecified: Secondary | ICD-10-CM | POA: Diagnosis not present

## 2019-10-25 DIAGNOSIS — E1140 Type 2 diabetes mellitus with diabetic neuropathy, unspecified: Secondary | ICD-10-CM | POA: Diagnosis not present

## 2019-10-25 DIAGNOSIS — D630 Anemia in neoplastic disease: Secondary | ICD-10-CM | POA: Diagnosis not present

## 2019-10-25 DIAGNOSIS — R011 Cardiac murmur, unspecified: Secondary | ICD-10-CM | POA: Diagnosis not present

## 2019-10-25 DIAGNOSIS — M8580 Other specified disorders of bone density and structure, unspecified site: Secondary | ICD-10-CM | POA: Diagnosis not present

## 2019-10-25 DIAGNOSIS — M1990 Unspecified osteoarthritis, unspecified site: Secondary | ICD-10-CM | POA: Diagnosis not present

## 2019-10-25 DIAGNOSIS — C786 Secondary malignant neoplasm of retroperitoneum and peritoneum: Secondary | ICD-10-CM | POA: Diagnosis not present

## 2019-10-25 NOTE — Telephone Encounter (Signed)
Please advise 

## 2019-10-25 NOTE — Telephone Encounter (Signed)
Telephone call to schedule palliative care visit.  Son did not answer phone.  RN left message requesting call back to schedule palliative care visit.

## 2019-10-25 NOTE — Telephone Encounter (Signed)
Jerilynn from [facility] [phone] calling for order for PT for 1time a week for 8 weeks.  Cecile Hearing also asked since the pt been on an antibotic she now have a yeast infection and would like to see if Dr. Volanda Napoleon can call in some medication. Also would like to know if its ok for the pt to take over the counter tylenol.

## 2019-10-26 ENCOUNTER — Telehealth: Payer: Self-pay

## 2019-10-26 DIAGNOSIS — I130 Hypertensive heart and chronic kidney disease with heart failure and stage 1 through stage 4 chronic kidney disease, or unspecified chronic kidney disease: Secondary | ICD-10-CM | POA: Diagnosis not present

## 2019-10-26 DIAGNOSIS — I5031 Acute diastolic (congestive) heart failure: Secondary | ICD-10-CM | POA: Diagnosis not present

## 2019-10-26 DIAGNOSIS — J9601 Acute respiratory failure with hypoxia: Secondary | ICD-10-CM | POA: Diagnosis not present

## 2019-10-26 DIAGNOSIS — E1122 Type 2 diabetes mellitus with diabetic chronic kidney disease: Secondary | ICD-10-CM | POA: Diagnosis not present

## 2019-10-26 DIAGNOSIS — N1831 Chronic kidney disease, stage 3a: Secondary | ICD-10-CM | POA: Diagnosis not present

## 2019-10-26 DIAGNOSIS — J189 Pneumonia, unspecified organism: Secondary | ICD-10-CM | POA: Diagnosis not present

## 2019-10-26 NOTE — Telephone Encounter (Signed)
Telephone call to schedule palliative care visit.  RN did not receive answer, RN LM requesting call back to schedule palliative care visit.

## 2019-10-27 ENCOUNTER — Encounter: Payer: Self-pay | Admitting: Family Medicine

## 2019-10-30 ENCOUNTER — Telehealth (INDEPENDENT_AMBULATORY_CARE_PROVIDER_SITE_OTHER): Payer: Medicare Other | Admitting: Family Medicine

## 2019-10-30 ENCOUNTER — Encounter: Payer: Self-pay | Admitting: *Deleted

## 2019-10-30 ENCOUNTER — Other Ambulatory Visit: Payer: Self-pay | Admitting: *Deleted

## 2019-10-30 ENCOUNTER — Encounter: Payer: Self-pay | Admitting: Family Medicine

## 2019-10-30 ENCOUNTER — Telehealth: Payer: Self-pay | Admitting: Family Medicine

## 2019-10-30 VITALS — Ht 65.0 in

## 2019-10-30 DIAGNOSIS — B3731 Acute candidiasis of vulva and vagina: Secondary | ICD-10-CM

## 2019-10-30 DIAGNOSIS — N898 Other specified noninflammatory disorders of vagina: Secondary | ICD-10-CM

## 2019-10-30 DIAGNOSIS — B373 Candidiasis of vulva and vagina: Secondary | ICD-10-CM

## 2019-10-30 MED ORDER — TERCONAZOLE 0.4 % VA CREA: 1.0000 | TOPICAL_CREAM | Freq: Every day | VAGINAL | 0 refills | Status: AC

## 2019-10-30 MED ORDER — FLUCONAZOLE 150 MG PO TABS: 150.0000 mg | ORAL_TABLET | Freq: Once | ORAL | 0 refills | Status: AC

## 2019-10-30 NOTE — Telephone Encounter (Signed)
Patient was in the hospital and has been prescribed Azithromycin 250 mg and Cefdinir 300 mg.  She states she has a yeast infection from these medications.  Patient wants a call back to know what she can do.  Also pt's feet is still swollen.  She is still unsure how she supposed to take her fluid pills.  Pharmacy- [facility] [address]

## 2019-10-30 NOTE — Progress Notes (Signed)
[facility]   [phone] [phone]   Clinic Follow up Note   Patient Care Team: [name], MD as PCP - General (Family Medicine) [name], [name], MD as Consulting Physician (Cardiology) [name], MD as Consulting Physician (Obstetrics and Gynecology) [name], [name], NP as Nurse Practitioner (Hematology and Oncology) [facility] [name], MD as Consulting Physician (Hematology) [name], [name], MD as Consulting Physician (Gastroenterology) Virgina Evener, Dawn, RN (Inactive) as Oncology Nurse Navigator [name], MD as Consulting Physician (General Surgery) [name], NP as [place] Management  Date of Service:  11/01/2019  CHIEF COMPLAINT: F/u ofcholangiocarcinomaof liver  SUMMARY OF ONCOLOGIC HISTORY: Oncology History  Malignant neoplasm of female breast ([place])  11/06/2018 Genetic Testing   Negative genetic testing on the [facility] Common Hereditary Cancers panel. A variant of uncertain significance was identified in one of her APC genes, called c.1243G>A (p.Ala415Thr).  The Common Hereditary Cancers Panel offered by [facility] includes sequencing and/or deletion duplication testing of the following 48 genes: APC, ATM, AXIN2, BARD1, BMPR1A, BRCA1, BRCA2, BRIP1, CDH1, CDK4, CDKN2A (p14ARF), CDKN2A (p16INK4a), CHEK2, CTNNA1, DICER1, EPCAM (Deletion/duplication testing only), GREM1 (promoter region deletion/duplication testing only), KIT, MEN1, MLH1, MSH2, MSH3, MSH6, MUTYH, NBN, NF1, NHTL1, PALB2, PDGFRA, PMS2, POLD1, POLE, PTEN, RAD50, RAD51C, RAD51D, RNF43, SDHB, SDHC, SDHD, SMAD4, SMARCA4. STK11, TP53, TSC1, TSC2, and VHL.  The following genes were evaluated for sequence changes only: SDHA and HOXB13 c.251G>A variant only.    Intrahepatic cholangiocarcinoma ([place])  09/07/2018 Imaging   CT Chest IMPRESSION: 1. New, enhancing mass involving segment 4 of the liver and fundus of  gallbladder is concerning for malignancy. This may represent either metastatic disease from breast cancer or neoplasm primary to the liver or hepatic biliary tree. Further evaluation with contrast enhanced CT of the abdomen and pelvis is recommended. 2. No findings to suggest metastatic disease within the chest. 3.  Aortic Atherosclerosis (ICD10-I70.0). 4. Coronary artery calcifications.   09/13/2018 Pathology Results   Diagnosis Liver, needle/core biopsy - ADENOCARCINOMA. Microscopic Comment Immunohistochemistry for CK7 is positive. CK20, TTF1, CDX-2, GATA-3, PAX 8, Qualitative ER, p63 and CK5/6 are negative. The provided clinical history of remote mammary carcinoma is noted. Based on the morphology and immunophenotype of the adenocarcinoma observed in this specimen, primary cholangiocarcinoma is favored. Clinical and radiologic correlation are  encouraged. Results reported to [employer] on 09/15/2018. Intradepartmental consultation (Dr. Vic Ripper).   09/13/2018 Initial Diagnosis   Cholangiocarcinoma ([place])   09/23/2018 Procedure   Colonoscopy by Dr. Havery Moros 09/23/18  IMPRESSION - Two 3 to 4 mm polyps in the ascending colon, removed with a cold snare. Resected and retrieved. - Five 3 to 5 mm polyps in the transverse colon, removed with a cold snare. Resected and retrieved. - One 5 mm polyp at the splenic flexure, removed with a cold snare. Resected and retrieved. - Three 3 to 5 mm polyps in the sigmoid colon, removed with a cold snare. Resected and retrieved. - The examination was otherwise normal. Upper Endopscy by Dr. Havery Moros 09/23/18  IMPRESSION - Esophagogastric landmarks identified. - 2 cm hiatal hernia. - Normal esophagus otherwise. - A single gastric polyp. Resected and retrieved. - Mild gastritis. Biopsied. - Normal duodenal bulb and second portion of the duodenum.   09/23/2018 Pathology Results   Diagnosis 09/23/18 1. Surgical [P], duodenum - BENIGN SMALL BOWEL  MUCOSA. - NO ACTIVE INFLAMMATION OR VILLOUS ATROPHY IDENTIFIED. 2. Surgical [P], stomach, polyp - HYPERPLASTIC POLYP(S). - THERE IS NO EVIDENCE  OF MALIGNANCY. 3. Surgical [P], gastric antrum and gastric body - CHRONIC INACTIVE GASTRITIS. - THERE IS NO EVIDENCE OF HELICOBACTER-PYLORI, DYSPLASIA, OR MALIGNANCY. - SEE COMMENT. 4. Surgical [P], colon, sigmoid, splenic flexure, transverse and ascending, polyp (9) - TUBULAR ADENOMA(S). - SESSILE SERRATED POLYP WITHOUT CYTOLOGIC DYSPLASIA. - HIGH GRADE DYSPLASIA IS NOT IDENTIFIED. 5. Surgical [P], colon, sigmoid, polyp (2) - HYPERPLASTIC POLYP(S). - THERE IS NO EVIDENCE OF MALIGNANCY.   09/23/2018 Cancer Staging   Staging form: Intrahepatic Bile Duct, AJCC 8th Edition - Clinical stage from 09/23/2018: Stage IB (cT1b, cN0, cM0) - Signed by Aristotelis Vilardi, MD on 10/06/2018   09/29/2018 PET scan   PET 09/29/18 IMPRESSION: 1. Hypermetabolic mass in the RIGHT hepatic lobe consistent with biopsy proven adenocarcinoma. No additional liver metastasis. 2. No evidence of local breast cancer recurrence in the RIGHT breast or RIGHT axilla. 3. Mild bilateral hypermetabolic adrenal glands is favored benign hyperplasia. 4. No evidence of additional metastatic disease on skull base to thigh FDG PET scan.   11/06/2018 Genetic Testing   Negative genetic testing on the [facility] Common Hereditary Cancers panel. A variant of uncertain significance was identified in one of her APC genes, called c.1243G>A (p.Ala415Thr).  The Common Hereditary Cancers Panel offered by [facility] includes sequencing and/or deletion duplication testing of the following 48 genes: APC, ATM, AXIN2, BARD1, BMPR1A, BRCA1, BRCA2, BRIP1, CDH1, CDK4, CDKN2A (p14ARF), CDKN2A (p16INK4a), CHEK2, CTNNA1, DICER1, EPCAM (Deletion/duplication testing only), GREM1 (promoter region deletion/duplication testing only), KIT, MEN1, MLH1, MSH2, MSH3, MSH6, MUTYH, NBN, NF1, NHTL1, PALB2, PDGFRA, PMS2, POLD1, POLE,  PTEN, RAD50, RAD51C, RAD51D, RNF43, SDHB, SDHC, SDHD, SMAD4, SMARCA4. STK11, TP53, TSC1, TSC2, and VHL.  The following genes were evaluated for sequence changes only: SDHA and HOXB13 c.251G>A variant only.    11/17/2018 Pathology Results   Diagnosis 11/17/18 1. Soft tissue, biopsy, Diaphragmatic nodules - METASTATIC ADENOCARCINOMA, CONSISTENT WITH PATIENT'S CLINICAL HISTORY OF CHOLANGIOCARCINOMA. SEE NOTE 2. Liver, biopsy, Left - LIVER PARENCHYMA WITH A BENIGN FIBROTIC NODULE - NO EVIDENCE OF MALIGNANCY 3. Stomach, biopsy - BENIGN PAPILLARY MESOTHELIAL HYPERPLASIA - NO EVIDENCE OF MALIGNANCY   11/29/2018 Imaging   CT CAP WO Contrast  IMPRESSION: 1. Dominant liver mass appears grossly stable from 09/29/2018. Additional liver lesions are too small to characterize but were not shown to be hypermetabolic on PET. 2. Mild nodularity of both adrenal glands with associated hypermetabolism on 09/29/2018. Continued attention on follow-up exams is warranted. 3. Small right lower lobe nodules, stable from 09/07/2018. Again, attention on follow-up is recommended. 4. Trace bilateral pleural fluid. 5. Aortic atherosclerosis (ICD10-170.0). Coronary artery calcification. 6. Enlarged pulmonic trunk, indicative of pulmonary arterial hypertension.     11/30/2018 - 06/14/2019 Chemotherapy   First line chemo Oxaliplatin and gemcitabine q2weeks starting 11/30/18. Stopped Oxaliplatin on 05/03/19 due to worsening neuropathy. Added Cisplatin with C13 on 05/17/19 and stopped on C15 06/14/19 due to neuropathy. Reduced to maintenance single agent Gemcitabine on 06/28/19.    01/20/2019 Imaging   CT CAP IMPRESSION: restaging  1. Mild interval increase in size of dominant liver mass involving segment 4 and segment 5. 2. No significant or progressive adrenal nodularity identified to suggest metastatic disease. 3. Unchanged appearance of small right lower lobe and lingular lung nodules, nonspecific.   03/21/2019 PET scan     IMPRESSION: 1. Large right hepatic mass with SUV uptake near background hepatic activity, dramatic response to therapy, also with decrease in size when compared to the prior study. 2. Signs of prior right mastectomy and axillary dissection   as before. 3. Left adrenal activity in no longer above the level of activity seen in the contralateral, right adrenal gland or in the liver. Attention on follow-up. 4. No new signs of disease.   06/27/2019 PET scan   IMPRESSION: 1. Further decrease in size of a dominant hepatic mass which is non FDG avid. 2. No evidence of metastatic disease. 3. No evidence of left adrenal hypermetabolism or mass. 4. Incidental findings, including: Uterine fibroids. Coronary artery atherosclerosis. Aortic Atherosclerosis (ICD10-I70.0). Pulmonary artery enlargement suggests pulmonary arterial hypertension.   06/28/2019 -  Chemotherapy   Maintenance single agent Gemcitabine q2weeks  starting on 06/28/19 with C16.    09/15/2019 PET scan   IMPRESSION: 1. In the region of regional tumor at the junction of the right and left hepatic lobe, there is continued hypodensity and overall activity slightly less than that of the surrounding normal liver, indicating effective response to therapy. No current worrisome hypermetabolic lesion is identified in the liver or elsewhere. 2. Chronic asymmetric edema in the subcutaneous tissues of the right upper extremity, nonspecific. The patient has had a prior right mastectomy and right axillary dissection. 3. Other imaging findings of potential clinical significance: Aortic Atherosclerosis (ICD10-I70.0). Coronary atherosclerosis. Mild cardiomegaly. Small right and trace left pleural effusions. Subcutaneous and mild mesenteric edema. Suspected uterine fibroids.      CURRENT THERAPY:  Maintenance single agent Gemcitabine q2weeks starting on 06/28/19 with C16.  INTERVAL HISTORY:  Donna May is here for a follow up. She  presents to the clinic alone. She notes since hospital discharge she is doing well. She denies breathing issues or SOB. She notes she has feet swelling but taking lasix to help. She has no increase in urination. She is not sure if she is drinking enough fluids.  She notes she has yeast infection and she was given medication for this, but has not started as she is worried with side effects on liver. She notes her neuropathy is stable. She plans to start more PT at home. She notes she still drives but not on the highway. She is not taking Gabapentin. She recently has been using hemp lotion on her skin.    REVIEW OF SYSTEMS:   Constitutional: Denies fevers, chills or abnormal weight loss Eyes: Denies blurriness of vision Ears, nose, mouth, throat, and face: Denies mucositis or sore throat Respiratory: Denies cough, dyspnea or wheezes Cardiovascular: Denies palpitation, chest discomfort (+) lower extremity swelling Gastrointestinal:  Denies nausea, heartburn or change in bowel habits Skin: Denies abnormal skin rashes Lymphatics: Denies new lymphadenopathy or easy bruising Neurological: (+) Stable Neuropathy Behavioral/Psych: Mood is stable, no new changes  All other systems were reviewed with the patient and are negative.  MEDICAL HISTORY:  Past Medical History:  Diagnosis Date  . Anemia   . Anxiety   . Arthritis   . Breast cancer ([place]) 1992  . Cataract    Bilateral eyes - surgery to remove  . Chronic kidney disease    CKD stage 3  . Complication of anesthesia    "something they use make me itch for a couple of days."  . Depression   . Diabetes mellitus    type 2  . Duodenitis 01/18/2002  . Fainting spell   . Family history of breast cancer   . Family history of prostate cancer   . GERD (gastroesophageal reflux disease)   . Heart murmur    never has caused any problems  . Hiatal hernia 08/08/2008, 01/18/2002  . History of  pneumonia    x 2  . Hyperlipidemia   . Hypertension   .  Liver cancer ([facility]) 08/2018  . Lymphedema 2017   Right arm  . Pneumonia    x 2  . UTI (lower urinary tract infection)     SURGICAL HISTORY: Past Surgical History:  Procedure Laterality Date  . AXILLARY SURGERY     cyst removal, right  . COLONOSCOPY  09/23/2018   Dr. Adela Lank - polyps  . EYE SURGERY Bilateral    cataracts to remove  . LAPAROSCOPY N/A 11/17/2018   Procedure: LAPAROSCOPY DIAGNOSTIC, INTRAOPERATIVE ULTRASOUND, PERITONEAL BIOPSIES;  Surgeon: Almond Lint, MD;  Location: [facility] OR;  Service: General;  Laterality: N/A;  GENERAL AND EPIDURAL  . LIVER BIOPSY  08/2018   Dr. Rip Harbour  . MASTECTOMY  1992   right, with flap  . NM MYOCAR PERF WALL MOTION  06/11/2009   Protocol:Bruce, post stress EF58%, EKG negative for ischemia, low risk  . PORTACATH PLACEMENT N/A 12/02/2018   Procedure: INSERTION PORT-A-CATH;  Surgeon: Almond Lint, MD;  Location: [facility] OR;  Service: General;  Laterality: N/A;  . RECONSTRUCTION BREAST W/ TRAM FLAP Right   . TONSILLECTOMY    . TRANSTHORACIC ECHOCARDIOGRAM  12/24/2009   LVEF =>55%, normal study  . UPPER GI ENDOSCOPY      I have reviewed the social history and family history with the patient and they are unchanged from previous note.  ALLERGIES:  has No Known Allergies.  MEDICATIONS:  Current Outpatient Medications  Medication Sig Dispense Refill  . amLODipine (NORVASC) 10 MG tablet Take 1 tablet (10 mg total) by mouth daily. 90 tablet 3  . aspirin 81 MG tablet Take 81 mg by mouth daily.     . Calcium Carbonate-Vitamin D 600-200 MG-UNIT TABS Take 1 tablet by mouth daily.    Marland Kitchen CALCIUM-MAGNESIUM-VITAMIN D PO Take 1 tablet by mouth once a week.     . furosemide (LASIX) 40 MG tablet Take 1 tablet (40 mg total) by mouth daily. 30 tablet 1  . glucose blood test strip 1 each by Other route 2 (two) times daily. Use Onetouch verio test strips as instructed to check blood sugar twice daily. 100 each 2  . lansoprazole (PREVACID) 15 MG capsule Take 1  capsule (15 mg total) by mouth daily. 90 capsule 1  . lidocaine-prilocaine (EMLA) cream Apply to affected area once (Patient taking differently: Apply 1 application topically as needed (for port). ) 90 g 1  . loratadine (CLARITIN) 10 MG tablet Take 10 mg by mouth daily as needed for allergies.    . metoprolol succinate (TOPROL-XL) 100 MG 24 hr tablet Take 1 tablet (100 mg total) by mouth daily. Take with or immediately following a meal. 90 tablet 1  . Omega 3 1000 MG CAPS Take 2,000 mg by mouth daily.     . potassium chloride SA (KLOR-CON) 10 MEQ tablet Take 1 tablet (10 mEq total) by mouth daily. 90 tablet 1  . sitaGLIPtin (JANUVIA) 100 MG tablet Take 1 tablet (100 mg total) by mouth daily. 90 tablet 4  . terconazole (TERAZOL 7) 0.4 % vaginal cream Place 1 applicator vaginally at bedtime for 7 days. 45 g 0  . valsartan (DIOVAN) 320 MG tablet Take 1 tablet (320 mg total) by mouth daily. 90 tablet 3   Current Facility-Administered Medications  Medication Dose Route Frequency Provider Last Rate Last Admin  . triamcinolone acetonide (KENALOG) 10 MG/ML injection 10 mg  10 mg Other Once Alvan Dame,  DPM       Facility-Administered Medications Ordered in Other Visits  Medication Dose Route Frequency Provider Last Rate Last Admin  . gemcitabine (GEMZAR) 988 mg in sodium chloride 0.9 % 100 mL chemo infusion  600 mg/m2 (Treatment Plan Recorded) Intravenous Once [name], MD      . heparin lock flush 100 unit/mL  500 Units Intracatheter Once PRN [name], MD      . sodium chloride flush (NS) 0.9 % injection 10 mL  10 mL Intracatheter PRN [name], MD        PHYSICAL EXAMINATION: ECOG PERFORMANCE STATUS: 2 - Symptomatic, <50% confined to bed  Vitals:   11/01/19 0844  BP: (!) 176/74  Pulse: 67  Resp: 17  Temp: 98 F (36.7 C)  SpO2: 99%   Filed Weights   11/01/19 0844  Weight: 125 lb 14.4 oz (57.1 kg)    Due to COVID19 we will limit examination to appearance. Patient had no  complaints.  GENERAL:alert, no distress and comfortable SKIN: skin color normal, no rashes or significant lesions EYES: normal, Conjunctiva are pink and non-injected, sclera clear  NEURO: alert & oriented x 3 with fluent speech   LABORATORY DATA:  I have reviewed the data as listed CBC Latest Ref Rng & Units 11/01/2019 10/23/2019 10/21/2019  WBC 4.0 - 10.5 K/uL 5.9 5.0 5.0  Hemoglobin 12.0 - 15.0 g/dL 7.8(L) 7.6(L) 7.1(L)  Hematocrit 36 - 46 % 25.4(L) 23.2(L) 22.6(L)  Platelets 150 - 400 K/uL 262 371 325     CMP Latest Ref Rng & Units 11/01/2019 10/23/2019 10/22/2019  Glucose 70 - 99 mg/dL 98 96 93  BUN 8 - 23 mg/dL 32(H) 36(H) 39(H)  Creatinine 0.44 - 1.00 mg/dL 1.72(H) 1.30(H) 1.43(H)  Sodium 135 - 145 mmol/L 141 142 143  Potassium 3.5 - 5.1 mmol/L 4.4 4.2 3.8  Chloride 98 - 111 mmol/L 109 107 104  CO2 22 - 32 mmol/L '24 27 28  '$ Calcium 8.9 - 10.3 mg/dL 9.1 8.7 8.4(L)  Total Protein 6.5 - 8.1 g/dL 7.1 - -  Total Bilirubin 0.3 - 1.2 mg/dL 0.5 - -  Alkaline Phos 38 - 126 U/L 83 - -  AST 15 - 41 U/L 30 - -  ALT 0 - 44 U/L 15 - -      RADIOGRAPHIC STUDIES: I have personally reviewed the radiological images as listed and agreed with the findings in the report. No results found.   ASSESSMENT & PLAN:  PRESSLEY BARSKY is a 79 y.o. female with    1.Intrahepatic cholangiocarcinoma, cT1N0M1,with peritoneal metastasis, MSS, IDH1 mutation (+) -She was diagnosed in 08/2018. Her biopsy of her liver mass shows adenocarcinoma,most consistent withcholangiocarcinoma.Her EGD/Colonoscopy from 7/10/20was negativeand 09/29/18 PET scanshowedno evidence of distant metastasis. -She was brought to OR on 9/3,unfortunately she was found to have peritoneal metastasis to right diaphragm and surgery was aborted. -Given her metastatic cancer, Istarted her onsystemicchemo withFirst line chemoOxaliplatinand gemcitabine q2weeksto control her disease.She started on 11/30/18.Stopped Oxaliplatin on 05/03/19  due to worsening neuropathy.AddedCisplatin with C13 on 05/17/19.Due to progressing neuropathy cisplatin was stopped on 06/14/19 and she proceeding with single agent Gemcitabineevery 2 weeksstarting 06/28/19 with C16.Dose was reduced to '600mg'$ /m2 since 09/20/19 due to anemia.  -After recent hospitalization for acute pulmonary edema her breathing is now adequate. She has residual edema of her feet. She will continue Lasix. She has current vaginal yeast infection from her antibiotics. She will treat with oral medication and suppository. She is fine to take.  -  Labs reviewed and adequate to proceed with Gemcitabine today.  -Plan to repeat scan in early 12/2019.  -F/u in 2 weeks    2. Anemia -Secondary to chemo -Shepreviouslyrequiredblood transfusionon1/13/21, 08/16/19, 10/11/19. Will continue prenatal vitamin.Her MMA was normal (08/09/19).  -Labs showed, Hg at 7.8 today (11/01/19). today (10/18/19). Will proceed with blood transfusion this week. She is agreeable.    3. Neuropathy, G3 -secondary to Oxaliplatin, dose was reduced and eventually dc'd after cycle 11 -Started cisplatin on 05/17/19, neuropathy worsened in her feet with unsteady gait and functional limitations.Cisplatin was dose reducedthen eventually stopped on 06/14/19. -Due to low tolerance and not much help, she stopped Lyrica, Gabapentin and Cymbalta.  -She waspreviouslyseen by Dr Mickeal Skinner. She is now being seen by Chiropractor Dr Page Spiro who currently has her on a light and vibration treatment. I will hold on neurology referral to Dr. Evelena Leyden for now. -She plans to restart PT now at home, but has not proceeded with OT yet. She remains to have decreased function overall. Stable. I gave her OT contact number to schedule start.  -She notes help from Hemp lotion on her skin. I encouraged her to continue to exercise.     4. H/o right breast cancer, Geneticsnegative, Osteopenia  -s/prightmastectomy in 1992, per patient did  not require adjuvant therapy. She was last seen by Dr. Jana Hakim in 2017.  -Her genetic testing wasnegativefor pathogeneticmutations. She did have VUS of gene APC. -She was seen to have right arm lymphedema on her 09/15/19 PET scan. -She notes she had remained on Raloxifene since her breast cancer and ran out recently. She notes she only continues given her mild bone density.  -I discussed her 06/2017 DEXA shows mild osteopenia. I recommend she return to her Gyn who she has not seen in years about this refill and continuing. I do not think she has to continue at this point.   5.Comorbidities:DM,HTN,hiatal hernia, GERD, renal artery stenosis -f/u with PCP -She did not tolerate Glucerna, she will continue low sugar protein shakes. -She is on amlodipine and HTCZ, metoprolol and losartan.HTN uncontrolled. I encouraged her to f/u with PCP. -Her HTCZ was held due to elevated Cr and then restarted on 09/20/19 due to worsened BP. I advised her to have her PCP adjust her HTN medications moving forward. -Cr worsen due to her lasix  -I encouraged her to f/u with her cardiologist   6. Family support, Goal of Care Discussion -She has a son and daughterwho are often busy but help as needed.  -She notes they are aware of her condition. Iencouraged her to keep her children updated and ask them for more home support. -The patient understands the goal of care is palliative. -she is full code now  7.CKD stage III -I strongly encouraged her to drink more water at least 40 ounces daily to maintain kidney function. -Was improving but has increased recently.Continue to monitor.Stable Cr at 1.2-15. -NP Laciepreviouslystopped her HCTZ on 08/23/19. Due to now elevated BP I restarted her on 09/20/19.  8. Recent acute pulmonary edema and pneumonia  -Pt developed flush pulmonary edema and pneumonia after chemo in my office on 10/18/19. The IVF given with chemo is only 250-357m, not sure if it's related  to her chemo. She told she did have intermittent wheezing in the past, especially at night, usually resolves on it's own. Echo showed diastolic dysfunction, normal EF.  -She recently completed her antibiotics, last week.  -Since hospital discharge her breathing is adequate and baseline. She still  has feet swelling, now on Lasix. She can drink 32-42 ounces a day for now.  -I encouraged her to f/u with cardiologist Dr Sallyanne Kuster   PLAN: -Labs reviewed and adequate to proceed with Gemcitabine todayat same dose, will reduce the infusion volume to 170m  -I provided her with OT contact number -1U Blood transfusion this week with lasix '20mg'$ .  -Lab, flush, f/u with me or APP and Gemcitabine in 2, 4, 6 weeks   No problem-specific Assessment & Plan notes found for this encounter.   No orders of the defined types were placed in this encounter.  All questions were answered. The patient knows to call the clinic with any problems, questions or concerns. No barriers to learning was detected. The total time spent in the appointment was 30 minutes.     YTruitt Merle MD 11/01/2019   I, AJoslyn Devon am acting as scribe for YTruitt Merle MD.   I have reviewed the above documentation for accuracy and completeness, and I agree with the above.

## 2019-10-30 NOTE — Telephone Encounter (Signed)
FYI Spoke with pt was hesitant to schedule appointment , advised pt that Dr Volanda Napoleon is off today and tomorrow and offered pt to have a telephone visit with Dr Martinique. Pt agreed to have a telephone visit with Dr Martinique today at 4.30 pm.

## 2019-10-30 NOTE — Progress Notes (Signed)
Virtual Visit via Telephone Note  I connected with Donna May on 10/30/19 at  4:30 PM EDT by telephone and verified that I am speaking with the correct person using two identifiers.   I discussed the limitations, risks, security and privacy concerns of performing an evaluation and management service by telephone and the availability of in person appointments. I also discussed with the patient that there may be a patient responsible charge related to this service. The patient expressed understanding and agreed to proceed.  Location patient: home Location provider: work office Participants present for the call: patient, provider Patient did not have a visit in the prior 7 days to address this/these issue(s).  Chief Complaint  Patient presents with   Vaginitis    History of Present Illness: Donna May is a 79 year old female with history of breast cancer, intrahepatic cholangiocarcinoma, hypertension, hyperlipidemia, and GERD complaining about vaginal pruritus after completing antibiotic treatment for pneumonia. She was hospitalized from 8/4-10/22/19, completed treatment with azithromycin and cefdinir. Respiratory symptoms have resolved. She has not noted vaginal discharge or bleeding. Negative for fever, abdominal pain, nausea, vomiting, urinary symptoms, jaundice,or a skin rash.  She has not tried OTC medications.  Lab Results  Component Value Date   ALT 11 10/18/2019   AST 27 10/18/2019   ALKPHOS 53 10/18/2019   BILITOT 0.9 10/18/2019   Denies generalized skin pruritus or color changes in urine/stool.  Observations/Objective: Patient sounds cheerful and well on the phone. I do not appreciate any SOB. Speech and thought processing are grossly intact. Patient reported vitals:Ht 5\' 5"  (1.651 m)    LMP  (LMP Unknown)    BMI 20.97 kg/m   Assessment and Plan:  1. Vaginal pruritus We discussed possible etiologies. If problem is persistent, recommend arranging appointment with  PCP, she may need a pelvic exam.  2. Vulvovaginal candidiasis Given her history of recent antibiotic treatment, will treat empirically as yeast infection. We discussed some side effects of Diflucan, last LFTs in normal range. Follow-up with PCP as needed.  - fluconazole (DIFLUCAN) 150 MG tablet; Take 1 tablet (150 mg total) by mouth once for 1 dose.  Dispense: 1 tablet; Refill: 0 - terconazole (TERAZOL 7) 0.4 % vaginal cream; Place 1 applicator vaginally at bedtime for 7 days.  Dispense: 45 g; Refill: 0  Follow Up Instructions:  Return if symptoms worsen or fail to improve.  I did not refer this patient for an OV in the next 24 hours for this/these issue(s).  I discussed the assessment and treatment plan with the patient. Donna May was provided an opportunity to ask questions and all were answered. She agreed with the plan and demonstrated an understanding of the instructions.   I provided 6 minutes of non-face-to-face time during this encounter.   Gregor Dershem Martinique, MD

## 2019-10-30 NOTE — Patient Outreach (Signed)
[facility]) Care Management  10/30/2019  Donna May [birth year] [id number]  Initial outreach for a [facility] FLAG on [facility] DISCHARGE call.  Talked with Ms. Moulin, she verified herself and gave permission for NP to access MR.  She says she wants to know when to take her furosemide. Advised it is best to take in the am so it can have it's effects in the am and you can go about your daily routine without worrying about having to find a bathroom. She was appreciative of the information.  Informed her about [facility] Care Management.  Note [facility] nurse has attempted to call her twice without success. Gave pt nurses name and [facility] number for her to call Nilda Simmer. RN.  I will call her again at the end of the month for follow up to see if she wants to go with [facility] or [facility].  [name]. [name], MSN, [facility] Gerontological Nurse Practitioner [facility] Care Management [phone]

## 2019-10-31 ENCOUNTER — Telehealth: Payer: Self-pay

## 2019-10-31 NOTE — Telephone Encounter (Signed)
Telephone call to schedule palliative care visit.  RN has made multiple attempts and LM message for son [name] to return call without success.  RN also called and LM for Dr Volanda Napoleon informing MD RN has not been able to reach patient to discuss palliative care services. Receptionist gave RN 2 other numbers [name]) [phone] no answer and VM not set up and [phone] line was busy.

## 2019-10-31 NOTE — Telephone Encounter (Signed)
Monona for VO.  Pt seen virtually for possible yeast infection.

## 2019-11-01 ENCOUNTER — Encounter: Payer: Self-pay | Admitting: Hematology

## 2019-11-01 ENCOUNTER — Inpatient Hospital Stay: Payer: Medicare Other

## 2019-11-01 ENCOUNTER — Telehealth: Payer: Self-pay | Admitting: Oncology

## 2019-11-01 ENCOUNTER — Encounter: Payer: Medicare Other | Admitting: Nutrition

## 2019-11-01 ENCOUNTER — Inpatient Hospital Stay (HOSPITAL_BASED_OUTPATIENT_CLINIC_OR_DEPARTMENT_OTHER): Payer: Medicare Other | Admitting: Hematology

## 2019-11-01 ENCOUNTER — Other Ambulatory Visit: Payer: Self-pay

## 2019-11-01 VITALS — BP 176/74 | HR 67 | Temp 98.0°F | Resp 17 | Ht 65.0 in | Wt 125.9 lb

## 2019-11-01 DIAGNOSIS — C221 Intrahepatic bile duct carcinoma: Secondary | ICD-10-CM

## 2019-11-01 DIAGNOSIS — D6481 Anemia due to antineoplastic chemotherapy: Secondary | ICD-10-CM | POA: Diagnosis not present

## 2019-11-01 DIAGNOSIS — C786 Secondary malignant neoplasm of retroperitoneum and peritoneum: Secondary | ICD-10-CM | POA: Diagnosis not present

## 2019-11-01 DIAGNOSIS — Z95828 Presence of other vascular implants and grafts: Secondary | ICD-10-CM

## 2019-11-01 DIAGNOSIS — D649 Anemia, unspecified: Secondary | ICD-10-CM

## 2019-11-01 DIAGNOSIS — Z5111 Encounter for antineoplastic chemotherapy: Secondary | ICD-10-CM | POA: Diagnosis not present

## 2019-11-01 LAB — CBC WITH DIFFERENTIAL (CANCER CENTER ONLY)
Abs Immature Granulocytes: 0.03 10*3/uL (ref 0.00–0.07)
Basophils Absolute: 0 10*3/uL (ref 0.0–0.1)
Basophils Relative: 0 %
Eosinophils Absolute: 0.1 10*3/uL (ref 0.0–0.5)
Eosinophils Relative: 2 %
HCT: 25.4 % — ABNORMAL LOW (ref 36.0–46.0)
Hemoglobin: 7.8 g/dL — ABNORMAL LOW (ref 12.0–15.0)
Immature Granulocytes: 1 %
Lymphocytes Relative: 16 %
Lymphs Abs: 0.9 10*3/uL (ref 0.7–4.0)
MCH: 31.1 pg (ref 26.0–34.0)
MCHC: 30.7 g/dL (ref 30.0–36.0)
MCV: 101.2 fL — ABNORMAL HIGH (ref 80.0–100.0)
Monocytes Absolute: 0.9 10*3/uL (ref 0.1–1.0)
Monocytes Relative: 16 %
Neutro Abs: 3.8 10*3/uL (ref 1.7–7.7)
Neutrophils Relative %: 65 %
Platelet Count: 262 10*3/uL (ref 150–400)
RBC: 2.51 MIL/uL — ABNORMAL LOW (ref 3.87–5.11)
RDW: 18.7 % — ABNORMAL HIGH (ref 11.5–15.5)
WBC Count: 5.9 10*3/uL (ref 4.0–10.5)
nRBC: 0 % (ref 0.0–0.2)

## 2019-11-01 LAB — CMP (CANCER CENTER ONLY)
ALT: 15 U/L (ref 0–44)
AST: 30 U/L (ref 15–41)
Albumin: 3.1 g/dL — ABNORMAL LOW (ref 3.5–5.0)
Alkaline Phosphatase: 83 U/L (ref 38–126)
Anion gap: 8 (ref 5–15)
BUN: 32 mg/dL — ABNORMAL HIGH (ref 8–23)
CO2: 24 mmol/L (ref 22–32)
Calcium: 9.1 mg/dL (ref 8.9–10.3)
Chloride: 109 mmol/L (ref 98–111)
Creatinine: 1.72 mg/dL — ABNORMAL HIGH (ref 0.44–1.00)
GFR, Est AFR Am: 32 mL/min — ABNORMAL LOW (ref >60)
GFR, Estimated: 28 mL/min — ABNORMAL LOW (ref >60)
Glucose, Bld: 98 mg/dL (ref 70–99)
Potassium: 4.4 mmol/L (ref 3.5–5.1)
Sodium: 141 mmol/L (ref 135–145)
Total Bilirubin: 0.5 mg/dL (ref 0.3–1.2)
Total Protein: 7.1 g/dL (ref 6.5–8.1)

## 2019-11-01 LAB — MAGNESIUM: Magnesium: 2.4 mg/dL (ref 1.7–2.4)

## 2019-11-01 MED ORDER — SODIUM CHLORIDE 0.9 % IV SOLN
600.0000 mg/m2 | Freq: Once | INTRAVENOUS | Status: AC
  Administered 2019-11-01: 988 mg via INTRAVENOUS
  Filled 2019-11-01: qty 25.98

## 2019-11-01 MED ORDER — SODIUM CHLORIDE 0.9% FLUSH
10.0000 mL | Freq: Once | INTRAVENOUS | Status: AC
  Administered 2019-11-01: 10 mL
  Filled 2019-11-01: qty 10

## 2019-11-01 MED ORDER — SODIUM CHLORIDE 0.9 % IV SOLN
Freq: Once | INTRAVENOUS | Status: AC
  Filled 2019-11-01: qty 250

## 2019-11-01 MED ORDER — HEPARIN SOD (PORK) LOCK FLUSH 100 UNIT/ML IV SOLN
500.0000 [IU] | Freq: Once | INTRAVENOUS | Status: AC | PRN
  Administered 2019-11-01: 500 [IU]
  Filled 2019-11-01: qty 5

## 2019-11-01 MED ORDER — PROCHLORPERAZINE MALEATE 10 MG PO TABS
10.0000 mg | ORAL_TABLET | Freq: Once | ORAL | Status: AC
  Administered 2019-11-01: 10 mg via ORAL

## 2019-11-01 MED ORDER — SODIUM CHLORIDE 0.9% FLUSH
10.0000 mL | INTRAVENOUS | Status: DC | PRN
  Administered 2019-11-01: 10 mL
  Filled 2019-11-01: qty 10

## 2019-11-01 MED ORDER — SODIUM CHLORIDE 0.9 % IV SOLN: 600.0000 mg/m2 | Freq: Once | INTRAVENOUS | Status: DC

## 2019-11-01 MED ORDER — PROCHLORPERAZINE MALEATE 10 MG PO TABS
ORAL_TABLET | ORAL | Status: AC
  Filled 2019-11-01: qty 1

## 2019-11-01 NOTE — Patient Instructions (Signed)
[facility] Discharge Instructions for Patients Receiving Chemotherapy  Today you received the following chemotherapy agent: Gemcitabine (Gemzar)  To help prevent nausea and vomiting after your treatment, we encourage you to take your nausea medication as directed by your MD.   If you develop nausea and vomiting that is not controlled by your nausea medication, call the clinic.   BELOW ARE SYMPTOMS THAT SHOULD BE REPORTED IMMEDIATELY:  *FEVER GREATER THAN 100.5 F  *CHILLS WITH OR WITHOUT FEVER  NAUSEA AND VOMITING THAT IS NOT CONTROLLED WITH YOUR NAUSEA MEDICATION  *UNUSUAL SHORTNESS OF BREATH  *UNUSUAL BRUISING OR BLEEDING  TENDERNESS IN MOUTH AND THROAT WITH OR WITHOUT PRESENCE OF ULCERS  *URINARY PROBLEMS  *BOWEL PROBLEMS  UNUSUAL RASH Items with * indicate a potential emergency and should be followed up as soon as possible.  Feel free to call the clinic should you have any questions or concerns. The clinic phone number is [phone].  Please show the CHEMO ALERT CARD at check-in to the Emergency Department and triage nurse.   

## 2019-11-01 NOTE — Progress Notes (Signed)
11/01/19  OK to treat with labs, however Dr Burr Medico wants Gemcitabine to be placed in 100 ml NS today.  T.O. Dr Lavonda Jumbo, PharmD

## 2019-11-01 NOTE — Telephone Encounter (Signed)
Error

## 2019-11-01 NOTE — Telephone Encounter (Signed)
Pt had a virtual visit with Dr Martinique and was prescribed medications Diflucan

## 2019-11-02 ENCOUNTER — Inpatient Hospital Stay: Payer: Medicare Other

## 2019-11-02 ENCOUNTER — Other Ambulatory Visit: Payer: Self-pay

## 2019-11-02 DIAGNOSIS — D6481 Anemia due to antineoplastic chemotherapy: Secondary | ICD-10-CM | POA: Diagnosis not present

## 2019-11-02 DIAGNOSIS — C786 Secondary malignant neoplasm of retroperitoneum and peritoneum: Secondary | ICD-10-CM | POA: Diagnosis not present

## 2019-11-02 DIAGNOSIS — Z5111 Encounter for antineoplastic chemotherapy: Secondary | ICD-10-CM | POA: Diagnosis not present

## 2019-11-02 DIAGNOSIS — C221 Intrahepatic bile duct carcinoma: Secondary | ICD-10-CM | POA: Diagnosis not present

## 2019-11-02 DIAGNOSIS — D649 Anemia, unspecified: Secondary | ICD-10-CM

## 2019-11-02 LAB — PREPARE RBC (CROSSMATCH)

## 2019-11-02 MED ORDER — FUROSEMIDE 10 MG/ML IJ SOLN
INTRAMUSCULAR | Status: AC
  Filled 2019-11-02: qty 2

## 2019-11-02 MED ORDER — SODIUM CHLORIDE 0.9% FLUSH
10.0000 mL | INTRAVENOUS | Status: AC | PRN
  Administered 2019-11-02: 10 mL
  Filled 2019-11-02: qty 10

## 2019-11-02 MED ORDER — SODIUM CHLORIDE 0.9% IV SOLUTION
250.0000 mL | Freq: Once | INTRAVENOUS | Status: DC
  Filled 2019-11-02: qty 250

## 2019-11-02 MED ORDER — DIPHENHYDRAMINE HCL 25 MG PO CAPS
25.0000 mg | ORAL_CAPSULE | Freq: Once | ORAL | Status: AC
  Administered 2019-11-02: 25 mg via ORAL

## 2019-11-02 MED ORDER — ACETAMINOPHEN 325 MG PO TABS
650.0000 mg | ORAL_TABLET | Freq: Once | ORAL | Status: AC
  Administered 2019-11-02: 650 mg via ORAL

## 2019-11-02 MED ORDER — FUROSEMIDE 10 MG/ML IJ SOLN
10.0000 mg | Freq: Once | INTRAMUSCULAR | Status: AC
  Administered 2019-11-02: 10 mg via INTRAVENOUS

## 2019-11-02 MED ORDER — HEPARIN SOD (PORK) LOCK FLUSH 100 UNIT/ML IV SOLN
500.0000 [IU] | Freq: Every day | INTRAVENOUS | Status: AC | PRN
  Administered 2019-11-02: 500 [IU]
  Filled 2019-11-02: qty 5

## 2019-11-02 MED ORDER — DIPHENHYDRAMINE HCL 25 MG PO CAPS
ORAL_CAPSULE | ORAL | Status: AC
  Filled 2019-11-02: qty 1

## 2019-11-02 MED ORDER — ACETAMINOPHEN 325 MG PO TABS
ORAL_TABLET | ORAL | Status: AC
  Filled 2019-11-02: qty 2

## 2019-11-02 NOTE — Patient Instructions (Signed)

## 2019-11-03 ENCOUNTER — Telehealth: Payer: Self-pay | Admitting: Hematology

## 2019-11-03 LAB — BPAM RBC
Blood Product Expiration Date: 202109092359
ISSUE DATE / TIME: 202108191432
Unit Type and Rh: 6200

## 2019-11-03 LAB — TYPE AND SCREEN
ABO/RH(D): A POS
Antibody Screen: NEGATIVE
Unit division: 0

## 2019-11-03 NOTE — Telephone Encounter (Signed)
No 8/19 los

## 2019-11-07 ENCOUNTER — Other Ambulatory Visit: Payer: Self-pay

## 2019-11-07 ENCOUNTER — Other Ambulatory Visit: Payer: Medicare Other

## 2019-11-07 ENCOUNTER — Telehealth: Payer: Self-pay

## 2019-11-07 NOTE — Telephone Encounter (Signed)
Phone call placed to patient to introduce Palliative care and offer to schedule visit. VM left with call back information.

## 2019-11-08 NOTE — Telephone Encounter (Signed)
Spoke with patient and introduced Palliative care and provided education on services. Patient declined a visit this month but is open to scheduling a visit in September. Patient is alert and oriented, engaging in conversation. Denies any pain or shortness of breath. She shared that she is handling chemo well and denied any concerns. Patient shared she has lost weight and has been drinking Boost and appetite has slightly improved. She shared that she no longer cooks since living by herself but will go out to breakfast each morning. Encouraged patient to call Palliative care with any questions and concerns.

## 2019-11-09 ENCOUNTER — Other Ambulatory Visit: Payer: Self-pay | Admitting: *Deleted

## 2019-11-09 ENCOUNTER — Encounter: Payer: Self-pay | Admitting: *Deleted

## 2019-11-09 NOTE — Patient Outreach (Signed)
[facility]) Care Management  11/09/2019  BRAILEE RIEDE [birth year] [id number]  Pt returned my call. She is doing very well. She agreed to complete the initial assessment today.  Pt has hx HF, HTN, Respiratory Failure, DM, CKD, Breast Ca, Intrahepatic cholangiocarcinoma.  She is currently under the care of Dr. Ellender Hose, Dr. Katherene Ponto care, Dr. [name]-gastroenterology.  She resides alone but has family nearby who call and come by on a regular basis. She is independent .  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 She has pins and needles in her hands and feet. This also causes fine motor problems.  She identifies that she needs a transfer bench and she needs her son to check to see if her scale is operational as she does need to weigh everyday.  She drinks ensure but needs a low glucose formula and does state that it is quite expensive.  Patient was recently discharged from hospital and all medications have been reviewed. Outpatient Encounter Medications as of 11/09/2019  Medication Sig Note  . amLODipine (NORVASC) 10 MG tablet Take 1 tablet (10 mg total) by mouth daily.   Marland Kitchen aspirin 81 MG tablet Take 81 mg by mouth daily.    . Calcium Carbonate-Vitamin D 600-200 MG-UNIT TABS Take 1 tablet by mouth daily.   . furosemide (LASIX) 40 MG tablet Take 1 tablet (40 mg total) by mouth daily.   . lansoprazole (PREVACID) 15 MG capsule Take 1 capsule (15 mg total) by mouth daily.   Marland Kitchen lidocaine-prilocaine (EMLA) cream Apply to affected area once (Patient taking differently: Apply 1 application topically as needed (for port). )   . loratadine (CLARITIN) 10 MG tablet Take 10 mg by mouth daily as needed for allergies.   . metoprolol succinate (TOPROL-XL) 100 MG 24 hr tablet Take 1 tablet (100 mg total) by mouth daily. Take with or immediately following a meal.   . Omega 3 1000 MG CAPS Take 2,000 mg by mouth daily.    . potassium  chloride SA (KLOR-CON) 10 MEQ tablet Take 1 tablet (10 mEq total) by mouth daily.   . sitaGLIPtin (JANUVIA) 100 MG tablet Take 1 tablet (100 mg total) by mouth daily.   . valsartan (DIOVAN) 320 MG tablet Take 1 tablet (320 mg total) by mouth daily.   Marland Kitchen CALCIUM-MAGNESIUM-VITAMIN D PO Take 1 tablet by mouth once a week.    Marland Kitchen glucose blood test strip 1 each by Other route 2 (two) times daily. Use Onetouch verio test strips as instructed to check blood sugar twice daily. (Patient not taking: Reported on 11/09/2019) 10/30/2019: Not checking home glucose because of UE neuropathy and last HgbA1C was 4.+.   Facility-Administered Encounter Medications as of 11/09/2019  Medication  . triamcinolone acetonide (KENALOG) 10 MG/ML injection 10 mg   Fall Risk  11/10/2019 09/14/2018 05/16/2018 05/04/2017 04/26/2017  Falls in the past year? 0 0 0 Yes Yes  Comment - - - bent over to pick up the paper and slipped and fell out of the car  -  Number falls in past yr: 0 0 - 1 1  Injury with Fall? 0 0 - - No  Risk for fall due to : Impaired balance/gait;Other (Comment) - Impaired mobility;Impaired vision;History of fall(s) - -  Risk for fall due to: Comment Neuropathy - - - -  Follow up Falls evaluation completed Falls evaluation completed Education provided Education provided -   Depression screen [facility] 2/9 11/09/2019 09/14/2018  Decreased Interest 0 3  Down, Depressed, Hopeless 0 3  PHQ - 2 Score 0 6  Altered sleeping - 3  Tired, decreased energy - 0  Change in appetite - 0  Feeling bad or failure about yourself  - 0  Trouble concentrating - 0  Moving slowly or fidgety/restless - 0  Suicidal thoughts - 0  PHQ-9 Score - 9  Difficult doing work/chores - Not difficult at all  Some recent data might be hidden   Goals Addressed            This Visit's Progress   . Patient Stated       Will participate in phone calls with [facility] care manager.       PT TO HAVE SON CHECK HER SCALE AND START WEIGHING DAILY. NP TO SEND  SUPPLEMENT COUPONS. NP TO CALL SON AND ADVISE OF PT NEED FOR A TRANSFER BENCH FOR THE SHOWER.  Will call and talk to Mrs. Ehrhard in 2 weeks.  Donna May. [name], MSN, [facility] Gerontological Nurse Practitioner [facility] Care Management [phone]

## 2019-11-09 NOTE — Patient Outreach (Signed)
[facility]) Care Management  11/09/2019  Donna May [birth year] [id number]  Called Ms. Manner today for a follow up to her introduction to Walker on 10/30/19. Left message that I would like to speak with her and to return my call.  I will call her again on Monday, if I do not hear back from her today or tomorrow.  [name]. [name], MSN, [facility] Gerontological Nurse Practitioner [facility] Care Management [phone]

## 2019-11-10 ENCOUNTER — Encounter: Payer: Self-pay | Admitting: *Deleted

## 2019-11-13 ENCOUNTER — Other Ambulatory Visit: Payer: Self-pay | Admitting: *Deleted

## 2019-11-14 NOTE — Progress Notes (Signed)
Donna May   [phone] [phone]   Clinic Follow up Note   Patient Care Team: [name], MD as PCP - General (Family Medicine) [name], [name], MD as Consulting Physician (Cardiology) [name], MD as Consulting Physician (Obstetrics and Gynecology) [name], [name], NP as Nurse Practitioner (Hematology and Oncology) [facility] [name], MD as Consulting Physician (Hematology) [name], [name], MD as Consulting Physician (Gastroenterology) Virgina Evener, Dawn, RN (Inactive) as Oncology Nurse Navigator [name], MD as Consulting Physician (General Surgery) [name], NP as [facility] 11/15/2019  CHIEF COMPLAINT: Follow-up cholangiocarcinoma  SUMMARY OF ONCOLOGIC HISTORY: Oncology History  Malignant neoplasm of female breast ([place])  11/06/2018 Genetic Testing   Negative genetic testing on the [employer] Common Hereditary Cancers panel. A variant of uncertain significance was identified in one of her APC genes, called c.1243G>A (p.Ala415Thr).  The Common Hereditary Cancers Panel offered by [facility] includes sequencing and/or deletion duplication testing of the following 48 genes: APC, ATM, AXIN2, BARD1, BMPR1A, BRCA1, BRCA2, BRIP1, CDH1, CDK4, CDKN2A (p14ARF), CDKN2A (p16INK4a), CHEK2, CTNNA1, DICER1, EPCAM (Deletion/duplication testing only), GREM1 (promoter region deletion/duplication testing only), KIT, MEN1, MLH1, MSH2, MSH3, MSH6, MUTYH, NBN, NF1, NHTL1, PALB2, PDGFRA, PMS2, POLD1, POLE, PTEN, RAD50, RAD51C, RAD51D, RNF43, SDHB, SDHC, SDHD, SMAD4, SMARCA4. STK11, TP53, TSC1, TSC2, and VHL.  The following genes were evaluated for sequence changes only: SDHA and HOXB13 c.251G>A variant only.    Intrahepatic cholangiocarcinoma ([place])  09/07/2018 Imaging   CT Chest IMPRESSION: 1. New, enhancing mass involving segment 4 of the liver and fundus of gallbladder is concerning for  malignancy. This may represent either metastatic disease from breast cancer or neoplasm primary to the liver or hepatic biliary tree. Further evaluation with contrast enhanced CT of the abdomen and pelvis is recommended. 2. No findings to suggest metastatic disease within the chest. 3.  Aortic Atherosclerosis (ICD10-I70.0). 4. Coronary artery calcifications.   09/13/2018 Pathology Results   Diagnosis Liver, needle/core biopsy - ADENOCARCINOMA. Microscopic Comment Immunohistochemistry for CK7 is positive. CK20, TTF1, CDX-2, GATA-3, PAX 8, Qualitative ER, p63 and CK5/6 are negative. The provided clinical history of remote mammary carcinoma is noted. Based on the morphology and immunophenotype of the adenocarcinoma observed in this specimen, primary cholangiocarcinoma is favored. Clinical and radiologic correlation are  encouraged. Results reported to [employer] on 09/15/2018. Intradepartmental consultation (Dr. Vic Ripper).   09/13/2018 Initial Diagnosis   Cholangiocarcinoma ([place])   09/23/2018 Procedure   Colonoscopy by Dr. Havery Moros 09/23/18  IMPRESSION - Two 3 to 4 mm polyps in the ascending colon, removed with a cold snare. Resected and retrieved. - Five 3 to 5 mm polyps in the transverse colon, removed with a cold snare. Resected and retrieved. - One 5 mm polyp at the splenic flexure, removed with a cold snare. Resected and retrieved. - Three 3 to 5 mm polyps in the sigmoid colon, removed with a cold snare. Resected and retrieved. - The examination was otherwise normal. Upper Endopscy by Dr. Havery Moros 09/23/18  IMPRESSION - Esophagogastric landmarks identified. - 2 cm hiatal hernia. - Normal esophagus otherwise. - A single gastric polyp. Resected and retrieved. - Mild gastritis. Biopsied. - Normal duodenal bulb and second portion of the duodenum.   09/23/2018 Pathology Results   Diagnosis 09/23/18 1. Surgical [P], duodenum - BENIGN SMALL BOWEL MUCOSA. - NO ACTIVE INFLAMMATION OR  VILLOUS ATROPHY IDENTIFIED. 2. Surgical [P], stomach, polyp - HYPERPLASTIC POLYP(S). - THERE IS NO EVIDENCE OF MALIGNANCY. 3. Surgical [P], gastric  antrum and gastric body - CHRONIC INACTIVE GASTRITIS. - THERE IS NO EVIDENCE OF HELICOBACTER-PYLORI, DYSPLASIA, OR MALIGNANCY. - SEE COMMENT. 4. Surgical [P], colon, sigmoid, splenic flexure, transverse and ascending, polyp (9) - TUBULAR ADENOMA(S). - SESSILE SERRATED POLYP WITHOUT CYTOLOGIC DYSPLASIA. - HIGH GRADE DYSPLASIA IS NOT IDENTIFIED. 5. Surgical [P], colon, sigmoid, polyp (2) - HYPERPLASTIC POLYP(S). - THERE IS NO EVIDENCE OF MALIGNANCY.   09/23/2018 Cancer Staging   Staging form: Intrahepatic Bile Duct, AJCC 8th Edition - Clinical stage from 09/23/2018: Stage IB (cT1b, cN0, cM0) - Signed by Malachy Mood, MD on 10/06/2018   09/29/2018 PET scan   PET 09/29/18 IMPRESSION: 1. Hypermetabolic mass in the RIGHT hepatic lobe consistent with biopsy proven adenocarcinoma. No additional liver metastasis. 2. No evidence of local breast cancer recurrence in the RIGHT breast or RIGHT axilla. 3. Mild bilateral hypermetabolic adrenal glands is favored benign hyperplasia. 4. No evidence of additional metastatic disease on skull base to thigh FDG PET scan.   11/06/2018 Genetic Testing   Negative genetic testing on the [employer] Common Hereditary Cancers panel. A variant of uncertain significance was identified in one of her APC genes, called c.1243G>A (p.Ala415Thr).  The Common Hereditary Cancers Panel offered by [facility] includes sequencing and/or deletion duplication testing of the following 48 genes: APC, ATM, AXIN2, BARD1, BMPR1A, BRCA1, BRCA2, BRIP1, CDH1, CDK4, CDKN2A (p14ARF), CDKN2A (p16INK4a), CHEK2, CTNNA1, DICER1, EPCAM (Deletion/duplication testing only), GREM1 (promoter region deletion/duplication testing only), KIT, MEN1, MLH1, MSH2, MSH3, MSH6, MUTYH, NBN, NF1, NHTL1, PALB2, PDGFRA, PMS2, POLD1, POLE, PTEN, RAD50, RAD51C, RAD51D, RNF43,  SDHB, SDHC, SDHD, SMAD4, SMARCA4. STK11, TP53, TSC1, TSC2, and VHL.  The following genes were evaluated for sequence changes only: SDHA and HOXB13 c.251G>A variant only.    11/17/2018 Pathology Results   Diagnosis 11/17/18 1. Soft tissue, biopsy, Diaphragmatic nodules - METASTATIC ADENOCARCINOMA, CONSISTENT WITH PATIENT'S CLINICAL HISTORY OF CHOLANGIOCARCINOMA. SEE NOTE 2. Liver, biopsy, Left - LIVER PARENCHYMA WITH A BENIGN FIBROTIC NODULE - NO EVIDENCE OF MALIGNANCY 3. Stomach, biopsy - BENIGN PAPILLARY MESOTHELIAL HYPERPLASIA - NO EVIDENCE OF MALIGNANCY   11/29/2018 Imaging   CT CAP WO Contrast  IMPRESSION: 1. Dominant liver mass appears grossly stable from 09/29/2018. Additional liver lesions are too small to characterize but were not shown to be hypermetabolic on PET. 2. Mild nodularity of both adrenal glands with associated hypermetabolism on 09/29/2018. Continued attention on follow-up exams is warranted. 3. Small right lower lobe nodules, stable from 09/07/2018. Again, attention on follow-up is recommended. 4. Trace bilateral pleural fluid. 5. Aortic atherosclerosis (ICD10-170.0). Coronary artery calcification. 6. Enlarged pulmonic trunk, indicative of pulmonary arterial hypertension.     11/30/2018 - 06/14/2019 Chemotherapy   First line chemo Oxaliplatin and gemcitabine q2weeks starting 11/30/18. Stopped Oxaliplatin on 05/03/19 due to worsening neuropathy. Added Cisplatin with C13 on 05/17/19 and stopped on C15 06/14/19 due to neuropathy. Reduced to maintenance single agent Gemcitabine on 06/28/19.    01/20/2019 Imaging   CT CAP IMPRESSION: restaging  1. Mild interval increase in size of dominant liver mass involving segment 4 and segment 5. 2. No significant or progressive adrenal nodularity identified to suggest metastatic disease. 3. Unchanged appearance of small right lower lobe and lingular lung nodules, nonspecific.   03/21/2019 PET scan   IMPRESSION: 1. Large right  hepatic mass with SUV uptake near background hepatic activity, dramatic response to therapy, also with decrease in size when compared to the prior study. 2. Signs of prior right mastectomy and axillary dissection as before. 3. Left adrenal activity in  no longer above the level of activity seen in the contralateral, right adrenal gland or in the liver. Attention on follow-up. 4. No new signs of disease.   06/27/2019 PET scan   IMPRESSION: 1. Further decrease in size of a dominant hepatic mass which is non FDG avid. 2. No evidence of metastatic disease. 3. No evidence of left adrenal hypermetabolism or mass. 4. Incidental findings, including: Uterine fibroids. Coronary artery atherosclerosis. Aortic Atherosclerosis (ICD10-I70.0). Pulmonary artery enlargement suggests pulmonary arterial hypertension.   06/28/2019 -  Chemotherapy   Maintenance single agent Gemcitabine q2weeks  starting on 06/28/19 with C16.    09/15/2019 PET scan   IMPRESSION: 1. In the region of regional tumor at the junction of the right and left hepatic lobe, there is continued hypodensity and overall activity slightly less than that of the surrounding normal liver, indicating effective response to therapy. No current worrisome hypermetabolic lesion is identified in the liver or elsewhere. 2. Chronic asymmetric edema in the subcutaneous tissues of the right upper extremity, nonspecific. The patient has had a prior right mastectomy and right axillary dissection. 3. Other imaging findings of potential clinical significance: Aortic Atherosclerosis (ICD10-I70.0). Coronary atherosclerosis. Mild cardiomegaly. Small right and trace left pleural effusions. Subcutaneous and mild mesenteric edema. Suspected uterine fibroids.     CURRENT THERAPY: Maintenance single agent Gemcitabine q2weeks starting on 06/28/19 with C16.  INTERVAL HISTORY: Donna May returns for follow-up and treatment as scheduled.  She completed another  cycle of single agent gemcitabine on 11/01/2019.  She denies changes or new concerns.  Denies any pain.  She remains active.  P.o. intake is adequate.  Constipation is her new normal, BM every 1-3 days.  She drinks prune juice if needed.  She does not plan to take stool softener or laxative.  She thinks Lasix occasionally dries her out.  Neuropathy is slightly improved but still has functional difficulties.  Denies fever, chills, cough, chest pain, dyspnea, N/V/D, skin rash.   MEDICAL HISTORY:  Past Medical History:  Diagnosis Date  . Anemia   . Anxiety   . Arthritis   . Breast cancer ([place]) 1992  . Cataract    Bilateral eyes - surgery to remove  . Chronic kidney disease    CKD stage 3  . Complication of anesthesia    "something they use make me itch for a couple of days."  . Depression   . Diabetes mellitus    type 2  . Duodenitis 01/18/2002  . Fainting spell   . Family history of breast cancer   . Family history of prostate cancer   . GERD (gastroesophageal reflux disease)   . Heart murmur    never has caused any problems  . Hiatal hernia 08/08/2008, 01/18/2002  . History of pneumonia    x 2  . Hyperlipidemia   . Hypertension   . Liver cancer ([place]) 08/2018  . Lymphedema 2017   Right arm  . Pneumonia    x 2  . UTI (lower urinary tract infection)     SURGICAL HISTORY: Past Surgical History:  Procedure Laterality Date  . AXILLARY SURGERY     cyst removal, right  . COLONOSCOPY  09/23/2018   Dr. Havery Moros - polyps  . EYE SURGERY Bilateral    cataracts to remove  . LAPAROSCOPY N/A 11/17/2018   Procedure: LAPAROSCOPY DIAGNOSTIC, INTRAOPERATIVE ULTRASOUND, PERITONEAL BIOPSIES;  Surgeon: [name], MD;  Location: [facility];  Service: General;  Laterality: N/A;  GENERAL AND EPIDURAL  . LIVER BIOPSY  08/2018   Dr. Rip Harbour  . MASTECTOMY  1992   right, with flap  . NM MYOCAR PERF WALL MOTION  06/11/2009   Protocol:[place], post stress EF58%, EKG negative for ischemia, low risk  .  PORTACATH PLACEMENT N/A 12/02/2018   Procedure: INSERTION PORT-A-CATH;  Surgeon: Almond Lint, MD;  Location: [facility] OR;  Service: General;  Laterality: N/A;  . RECONSTRUCTION BREAST W/ TRAM FLAP Right   . TONSILLECTOMY    . TRANSTHORACIC ECHOCARDIOGRAM  12/24/2009   LVEF =>55%, normal study  . UPPER GI ENDOSCOPY      I have reviewed the social history and family history with the patient and they are unchanged from previous note.  ALLERGIES:  has No Known Allergies.  MEDICATIONS:  Current Outpatient Medications  Medication Sig Dispense Refill  . amLODipine (NORVASC) 10 MG tablet Take 1 tablet (10 mg total) by mouth daily. 90 tablet 3  . aspirin 81 MG tablet Take 81 mg by mouth daily.     . Calcium Carbonate-Vitamin D 600-200 MG-UNIT TABS Take 1 tablet by mouth daily.    Marland Kitchen CALCIUM-MAGNESIUM-VITAMIN D PO Take 1 tablet by mouth once a week.     . furosemide (LASIX) 40 MG tablet Take 1 tablet (40 mg total) by mouth daily. 30 tablet 1  . lansoprazole (PREVACID) 15 MG capsule Take 1 capsule (15 mg total) by mouth daily. 90 capsule 1  . lidocaine-prilocaine (EMLA) cream Apply to affected area once (Patient taking differently: Apply 1 application topically as needed (for port). ) 90 g 1  . loratadine (CLARITIN) 10 MG tablet Take 10 mg by mouth daily as needed for allergies.    . metoprolol succinate (TOPROL-XL) 100 MG 24 hr tablet Take 1 tablet (100 mg total) by mouth daily. Take with or immediately following a meal. 90 tablet 1  . Omega 3 1000 MG CAPS Take 2,000 mg by mouth daily.     . potassium chloride SA (KLOR-CON) 10 MEQ tablet Take 1 tablet (10 mEq total) by mouth daily. 90 tablet 1  . sitaGLIPtin (JANUVIA) 100 MG tablet Take 1 tablet (100 mg total) by mouth daily. 90 tablet 4  . valsartan (DIOVAN) 320 MG tablet Take 1 tablet (320 mg total) by mouth daily. 90 tablet 3  . glucose blood test strip 1 each by Other route 2 (two) times daily. Use Onetouch verio test strips as instructed to check  blood sugar twice daily. (Patient not taking: Reported on 11/09/2019) 100 each 2   Current Facility-Administered Medications  Medication Dose Route Frequency Provider Last Rate Last Admin  . triamcinolone acetonide (KENALOG) 10 MG/ML injection 10 mg  10 mg Other Once Alvan Dame, DPM       Facility-Administered Medications Ordered in Other Visits  Medication Dose Route Frequency Provider Last Rate Last Admin  . heparin lock flush 100 unit/mL  500 Units Intracatheter Once PRN Malachy Mood, MD      . sodium chloride flush (NS) 0.9 % injection 10 mL  10 mL Intracatheter PRN Malachy Mood, MD        PHYSICAL EXAMINATION: ECOG PERFORMANCE STATUS: 1 - Symptomatic but completely ambulatory  Vitals:   11/15/19 0827 11/15/19 0829  BP: (!) 188/74 (!) 182/75  Pulse: 65   Resp: 17   Temp: 97.6 F (36.4 C)   SpO2: 100%    Filed Weights   11/15/19 0827  Weight: 129 lb (58.5 kg)    GENERAL:alert, no distress and comfortable SKIN: No rash to exposed skin EYES:  sclera clear LUNGS: clear with normal breathing effort.  No wheezing or rhonchi HEART: regular rate & rhythm, trace lower extremity edema NEURO: alert & oriented x 3 with fluent speech PAC without erythema  LABORATORY DATA:  I have reviewed the data as listed CBC Latest Ref Rng & Units 11/15/2019 11/01/2019 10/23/2019  WBC 4.0 - 10.5 K/uL 5.8 5.9 5.0  Hemoglobin 12.0 - 15.0 g/dL 8.4(L) 7.8(L) 7.6(L)  Hematocrit 36 - 46 % 26.5(L) 25.4(L) 23.2(L)  Platelets 150 - 400 K/uL 217 262 371     CMP Latest Ref Rng & Units 11/15/2019 11/01/2019 10/23/2019  Glucose 70 - 99 mg/dL 98 98 96  BUN 8 - 23 mg/dL 29(H) 32(H) 36(H)  Creatinine 0.44 - 1.00 mg/dL 1.37(H) 1.72(H) 1.30(H)  Sodium 135 - 145 mmol/L 143 141 142  Potassium 3.5 - 5.1 mmol/L 3.9 4.4 4.2  Chloride 98 - 111 mmol/L 110 109 107  CO2 22 - 32 mmol/L $RemoveB'25 24 27  'cyKkZaNR$ Calcium 8.9 - 10.3 mg/dL 9.4 9.1 8.7  Total Protein 6.5 - 8.1 g/dL 6.9 7.1 -  Total Bilirubin 0.3 - 1.2 mg/dL 0.5 0.5 -   Alkaline Phos 38 - 126 U/L 64 83 -  AST 15 - 41 U/L 23 30 -  ALT 0 - 44 U/L <6 15 -      RADIOGRAPHIC STUDIES: I have personally reviewed the radiological images as listed and agreed with the findings in the report. No results found.   ASSESSMENT & PLAN: Donna May [id number]   1.Intrahepatic cholangiocarcinoma, cT1N0M1,with peritoneal metastasis, MSS, IDH1 mutation (+) -Diagnosed in 09/2018 biopsy of her liver mass shows adenocarcinoma,most consistent withcholangiocarcinoma -09/29/18 PET scanshowedno evidence of distant metastasis. -Unfortunately she was found to have peritoneal metastasis to right diaphragm and surgery was aborted. -HerCT AP from 9/15/20shows dominate liver mass stable to mild increase, mild nodularity of both adrenal glands which warrants being monitored.No visible peritoneal metastasis on CT scan. -Given her metastatic cancer and ineligibility forsurgery,she began first-line systemic chemotherapy for disease control. -FO resultsshowed MSI stable disease, IDH mutation positive.Consider IDH 1 inhibitor in the future.Not a candidate for immunotherapy -She beganfirst lineoxaliplatin(due to CKD)and gemcitabine every 2 weeks on 11/30/2018 -she responded very well to treatment and tolerated without significant toxicities except progressive neuropathy. Oxali was dose reduced and eventually stopped after cycle 11. She received single agent gemcitabine with cycle 12. Cisplatin added with C13 on 05/17/19 but neuropathy worsened and stopped on 06/14/19.  -She has been receiving single agent gemcitabine every 2 weeks starting from 06/28/19 C16, tolerating well.  PET on 09/15/2019 showed stable disease.  -Her anemia worsened on chemotherapy, gemcitabine dose reduced to 600 mg per metered squared since 09/20/2019.  She gets periodic blood transfusion with Lasix  2. H/o right breast cancer, Geneticsnegative -s/p mastectomy in 1992, per patient did  not require adjuvant therapy  -previouslyfollowed by Dr. Jana Hakim -VUS of gene APC;genetics otherwise normal  3.Comorbidities:DM, HTN, hiatal hernia, GERD, renal artery stenosis -Follow-up with PCP, nephrology, and Dr. Dwyane Dee -Crfluctuates, overall much improved fromJune- Sept this year (1.78 at highest). -I encouraged her to hydrate and avoid nephrotoxic agents.  4. Family support  -she has a son and daughterwho are supportive. -patient takes pride in remaining independent, but has become more reluctant to drive due to CIPN in her feet  5.Goal of care discussion  -Wefrequently discussthe goal of her treatment is palliative and that hercancer is not curable with chemo alone at this stage. The goal is to control her disease,  prolong her life, and give her good quality of life. She understands -she is full code now  6. CIPN, G2-3 -secondary to Oxaliplatin, eventually dc'd after cycle 11 -started cisplatin on 05/17/19, neuropathy worsened in her feet with unsteady gait and functional limitations, decreased vibratory sense on tuning fork exam 05/31/19.Cisplatin was dose reduced then eventually stopped on 06/14/19.  -low efficacy of gabapentin and cymbalta.  She completed PT for balance. Ambulates with cane to avoid fall -She has been referred to Dr. Mickeal Skinner for further evaluation and management.  -Stable to slightly improved  Disposition: Donna May appears stable.  She completed another cycle of single agent gemcitabine.  She tolerates treatment well without significant toxicities.  Her neuropathy from previous chemotherapy is stable.  She is able to function and recover well.  Her CBC and CMP are stable.  She will proceed with another cycle of gemcitabine today as planned.  Anticipate restaging in 12/2019.  She will return for follow-up and next cycle in 2 weeks.  A short-term disability form was completed after today's visit, will be mailed to her.   All questions were  answered. The patient knows to call the clinic with any problems, questions or concerns. No barriers to learning were detected.     Alla Feeling, NP 11/15/19

## 2019-11-15 ENCOUNTER — Inpatient Hospital Stay: Payer: Medicare Other

## 2019-11-15 ENCOUNTER — Encounter: Payer: Self-pay | Admitting: Nurse Practitioner

## 2019-11-15 ENCOUNTER — Inpatient Hospital Stay: Payer: Medicare Other | Attending: Adult Health

## 2019-11-15 ENCOUNTER — Inpatient Hospital Stay (HOSPITAL_BASED_OUTPATIENT_CLINIC_OR_DEPARTMENT_OTHER): Payer: Medicare Other | Admitting: Nurse Practitioner

## 2019-11-15 ENCOUNTER — Other Ambulatory Visit: Payer: Self-pay

## 2019-11-15 ENCOUNTER — Telehealth: Payer: Self-pay | Admitting: Nurse Practitioner

## 2019-11-15 VITALS — BP 182/75 | HR 65 | Temp 97.6°F | Resp 17 | Ht 65.0 in | Wt 129.0 lb

## 2019-11-15 DIAGNOSIS — C221 Intrahepatic bile duct carcinoma: Secondary | ICD-10-CM

## 2019-11-15 DIAGNOSIS — C7989 Secondary malignant neoplasm of other specified sites: Secondary | ICD-10-CM | POA: Insufficient documentation

## 2019-11-15 DIAGNOSIS — Z5111 Encounter for antineoplastic chemotherapy: Secondary | ICD-10-CM | POA: Insufficient documentation

## 2019-11-15 DIAGNOSIS — C786 Secondary malignant neoplasm of retroperitoneum and peritoneum: Secondary | ICD-10-CM | POA: Diagnosis not present

## 2019-11-15 DIAGNOSIS — D6481 Anemia due to antineoplastic chemotherapy: Secondary | ICD-10-CM | POA: Diagnosis not present

## 2019-11-15 DIAGNOSIS — Z95828 Presence of other vascular implants and grafts: Secondary | ICD-10-CM

## 2019-11-15 LAB — CMP (CANCER CENTER ONLY)
ALT: <6 U/L (ref 0–44)
AST: 23 U/L (ref 15–41)
Albumin: 2.9 g/dL — ABNORMAL LOW (ref 3.5–5.0)
Alkaline Phosphatase: 64 U/L (ref 38–126)
Anion gap: 8 (ref 5–15)
BUN: 29 mg/dL — ABNORMAL HIGH (ref 8–23)
CO2: 25 mmol/L (ref 22–32)
Calcium: 9.4 mg/dL (ref 8.9–10.3)
Chloride: 110 mmol/L (ref 98–111)
Creatinine: 1.37 mg/dL — ABNORMAL HIGH (ref 0.44–1.00)
GFR, Est AFR Am: 42 mL/min — ABNORMAL LOW (ref >60)
GFR, Estimated: 36 mL/min — ABNORMAL LOW (ref >60)
Glucose, Bld: 98 mg/dL (ref 70–99)
Potassium: 3.9 mmol/L (ref 3.5–5.1)
Sodium: 143 mmol/L (ref 135–145)
Total Bilirubin: 0.5 mg/dL (ref 0.3–1.2)
Total Protein: 6.9 g/dL (ref 6.5–8.1)

## 2019-11-15 LAB — CBC WITH DIFFERENTIAL (CANCER CENTER ONLY)
Abs Immature Granulocytes: 0.03 10*3/uL (ref 0.00–0.07)
Basophils Absolute: 0 10*3/uL (ref 0.0–0.1)
Basophils Relative: 0 %
Eosinophils Absolute: 0.1 10*3/uL (ref 0.0–0.5)
Eosinophils Relative: 2 %
HCT: 26.5 % — ABNORMAL LOW (ref 36.0–46.0)
Hemoglobin: 8.4 g/dL — ABNORMAL LOW (ref 12.0–15.0)
Immature Granulocytes: 1 %
Lymphocytes Relative: 14 %
Lymphs Abs: 0.8 10*3/uL (ref 0.7–4.0)
MCH: 31.3 pg (ref 26.0–34.0)
MCHC: 31.7 g/dL (ref 30.0–36.0)
MCV: 98.9 fL (ref 80.0–100.0)
Monocytes Absolute: 1.1 10*3/uL — ABNORMAL HIGH (ref 0.1–1.0)
Monocytes Relative: 18 %
Neutro Abs: 3.7 10*3/uL (ref 1.7–7.7)
Neutrophils Relative %: 65 %
Platelet Count: 217 10*3/uL (ref 150–400)
RBC: 2.68 MIL/uL — ABNORMAL LOW (ref 3.87–5.11)
RDW: 20.6 % — ABNORMAL HIGH (ref 11.5–15.5)
WBC Count: 5.8 10*3/uL (ref 4.0–10.5)
nRBC: 0 % (ref 0.0–0.2)

## 2019-11-15 LAB — MAGNESIUM: Magnesium: 2.1 mg/dL (ref 1.7–2.4)

## 2019-11-15 MED ORDER — HEPARIN SOD (PORK) LOCK FLUSH 100 UNIT/ML IV SOLN
500.0000 [IU] | Freq: Once | INTRAVENOUS | Status: AC | PRN
  Administered 2019-11-15: 500 [IU]
  Filled 2019-11-15: qty 5

## 2019-11-15 MED ORDER — SODIUM CHLORIDE 0.9 % IV SOLN
Freq: Once | INTRAVENOUS | Status: AC
  Filled 2019-11-15: qty 250

## 2019-11-15 MED ORDER — SODIUM CHLORIDE 0.9 % IV SOLN
600.0000 mg/m2 | Freq: Once | INTRAVENOUS | Status: AC
  Administered 2019-11-15: 988 mg via INTRAVENOUS
  Filled 2019-11-15: qty 25.98

## 2019-11-15 MED ORDER — SODIUM CHLORIDE 0.9% FLUSH
10.0000 mL | INTRAVENOUS | Status: DC | PRN
  Administered 2019-11-15: 10 mL
  Filled 2019-11-15: qty 10

## 2019-11-15 MED ORDER — SODIUM CHLORIDE 0.9% FLUSH
10.0000 mL | Freq: Once | INTRAVENOUS | Status: AC
  Administered 2019-11-15: 10 mL
  Filled 2019-11-15: qty 10

## 2019-11-15 MED ORDER — PROCHLORPERAZINE MALEATE 10 MG PO TABS
10.0000 mg | ORAL_TABLET | Freq: Once | ORAL | Status: AC
  Administered 2019-11-15: 10 mg via ORAL

## 2019-11-15 MED ORDER — PROCHLORPERAZINE MALEATE 10 MG PO TABS
ORAL_TABLET | ORAL | Status: AC
  Filled 2019-11-15: qty 1

## 2019-11-15 NOTE — Telephone Encounter (Signed)
Scheduled per 9/1 los. Noted to give pt appt calendar.

## 2019-11-15 NOTE — Patient Instructions (Signed)
[facility] Discharge Instructions for Patients Receiving Chemotherapy  Today you received the following chemotherapy agent: Gemcitabine (Gemzar)  To help prevent nausea and vomiting after your treatment, we encourage you to take your nausea medication as directed by your MD.   If you develop nausea and vomiting that is not controlled by your nausea medication, call the clinic.   BELOW ARE SYMPTOMS THAT SHOULD BE REPORTED IMMEDIATELY:  *FEVER GREATER THAN 100.5 F  *CHILLS WITH OR WITHOUT FEVER  NAUSEA AND VOMITING THAT IS NOT CONTROLLED WITH YOUR NAUSEA MEDICATION  *UNUSUAL SHORTNESS OF BREATH  *UNUSUAL BRUISING OR BLEEDING  TENDERNESS IN MOUTH AND THROAT WITH OR WITHOUT PRESENCE OF ULCERS  *URINARY PROBLEMS  *BOWEL PROBLEMS  UNUSUAL RASH Items with * indicate a potential emergency and should be followed up as soon as possible.  Feel free to call the clinic should you have any questions or concerns. The clinic phone number is [phone].  Please show the CHEMO ALERT CARD at check-in to the Emergency Department and triage nurse.   

## 2019-11-15 NOTE — Patient Instructions (Signed)

## 2019-11-15 NOTE — Progress Notes (Signed)
FYI disability paperwork completed by provider this nurse gave paperwork to pt this morning in infusion

## 2019-11-22 ENCOUNTER — Encounter: Payer: Self-pay | Admitting: Cardiovascular Disease

## 2019-11-22 ENCOUNTER — Ambulatory Visit (INDEPENDENT_AMBULATORY_CARE_PROVIDER_SITE_OTHER): Payer: Medicare Other | Admitting: Cardiovascular Disease

## 2019-11-22 ENCOUNTER — Other Ambulatory Visit: Payer: Self-pay

## 2019-11-22 VITALS — BP 174/74 | HR 71 | Ht 65.0 in | Wt 123.0 lb

## 2019-11-22 DIAGNOSIS — I5032 Chronic diastolic (congestive) heart failure: Secondary | ICD-10-CM | POA: Diagnosis not present

## 2019-11-22 DIAGNOSIS — I700 Atherosclerosis of aorta: Secondary | ICD-10-CM | POA: Diagnosis not present

## 2019-11-22 DIAGNOSIS — I150 Renovascular hypertension: Secondary | ICD-10-CM | POA: Diagnosis not present

## 2019-11-22 DIAGNOSIS — N1832 Chronic kidney disease, stage 3b: Secondary | ICD-10-CM

## 2019-11-22 DIAGNOSIS — C221 Intrahepatic bile duct carcinoma: Secondary | ICD-10-CM | POA: Diagnosis not present

## 2019-11-22 DIAGNOSIS — E1121 Type 2 diabetes mellitus with diabetic nephropathy: Secondary | ICD-10-CM | POA: Diagnosis not present

## 2019-11-22 DIAGNOSIS — I2584 Coronary atherosclerosis due to calcified coronary lesion: Secondary | ICD-10-CM

## 2019-11-22 DIAGNOSIS — E7800 Pure hypercholesterolemia, unspecified: Secondary | ICD-10-CM | POA: Diagnosis not present

## 2019-11-22 DIAGNOSIS — I2510 Atherosclerotic heart disease of native coronary artery without angina pectoris: Secondary | ICD-10-CM

## 2019-11-22 DIAGNOSIS — E1122 Type 2 diabetes mellitus with diabetic chronic kidney disease: Secondary | ICD-10-CM

## 2019-11-22 MED ORDER — HYDROCHLOROTHIAZIDE 12.5 MG PO CAPS: 12.5000 mg | ORAL_CAPSULE | Freq: Every day | ORAL | 1 refills | Status: DC

## 2019-11-22 MED ORDER — HYDROCHLOROTHIAZIDE 12.5 MG PO CAPS: 12.5000 mg | ORAL_CAPSULE | Freq: Every day | ORAL | 2 refills | Status: DC

## 2019-11-22 MED ORDER — FUROSEMIDE 40 MG PO TABS: 40.0000 mg | ORAL_TABLET | ORAL | 2 refills | Status: DC

## 2019-11-22 MED ORDER — POTASSIUM CHLORIDE CRYS ER 10 MEQ PO TBCR
10.0000 meq | EXTENDED_RELEASE_TABLET | ORAL | 2 refills | Status: DC
Start: 2019-11-23 — End: 2020-01-24

## 2019-11-22 NOTE — Progress Notes (Signed)
Patient ID: Donna May, female   DOB: 1940/10/15, 79 y.o.   MRN: [id number]    Cardiology Office Note    Date:  11/22/2019   ID:  Donna May [birth year], MRN [id number]  PCP:  [name], MD  Cardiologist:   Sanda Klein, MD   Chief Complaint  Patient presents with  . Hypertension    History of Present Illness:  Donna May is a 79 y.o. female with long-standing systemic hypertension, PAD (nonobstructive bilateral renal artery disease), long-standing hyperlipidemia and type 2 diabetes mellitus with diabetic nephropathy.She quit smoking 27 years ago. She has a very strong family history of premature heart disease in both parents and one of her brothers.  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  She has a remote history of breast cancer and some residual left arm lymphedema following lymph node dissection.  She was hospitalized with acute pulmonary edema about a month ago.  She was markedly hypertensive with systolic blood pressure around 200 mmHg and hypoxic in the 80s.  She was also anemic (hemoglobin as low as 6.3) and received a unit of blood transfusion.  Her chest x-ray was interpreted as showing multifocal pneumonia she received both antibiotics and intravenous furosemide but her improvement was very rapid, consistent with resolving pulmonary edema.  The left ventricular systolic function was normal EF of 55-60%, there was moderate LVH and echo confirmed grade 2 diastolic dysfunction.  No major valvular abnormalities were reported.  The myocardium was described as having a speckled appearance and amyloidosis was considered.  Note that the mitral annulus velocities were really not that low (lateral 8 cm/second, medial 4.5 cm/second).  She does not have low voltage on her EKG.  Hydrochlorothiazide was stopped and she was started on furosemide.  Her blood pressure has been elevated since then.  She had a low risk nuclear stress test in  July 2018.  Her echocardiogram in 2014 showed normal left ventricular systolic function, mild left ventricular hypertrophy, mild pulmonary artery hypertension (37 mmHg).  She had no evidence of stenoses in the lower extremities on arterial duplex study performed in 2019.  Mild plaque without obstruction was seen on ultrasound of the renal arteries in 2016.  Aortic atherosclerosis is incidentally noted on multiple imaging studies performed for cholangiocarcinoma.  She is receiving periodic gemcitabine/platinum infusions for cholangiocarcinoma.  Appetite is mediocre and she has been losing some more weight.  The foods she enjoys "do not taste right".  However, she reports that she is eating "a lot".  She denies chest pain or shortness of breath at rest with activity, leg edema, orthopnea or PND, syncope or palpitations, claudication or focal neurological events.     Past Medical History:  Diagnosis Date  . Anemia   . Anxiety   . Arthritis   . Breast cancer ([place]) 1992  . Cataract    Bilateral eyes - surgery to remove  . Chronic kidney disease    CKD stage 3  . Complication of anesthesia    "something they use make me itch for a couple of days."  . Depression   . Diabetes mellitus    type 2  . Duodenitis 01/18/2002  . Fainting spell   . Family history of breast cancer   . Family history of prostate cancer   . GERD (gastroesophageal reflux disease)   . Heart murmur    never has caused any problems  . Hiatal hernia 08/08/2008, 01/18/2002  .  History of pneumonia    x 2  . Hyperlipidemia   . Hypertension   . Liver cancer ([place]) 08/2018  . Lymphedema 2017   Right arm  . Pneumonia    x 2  . UTI (lower urinary tract infection)     Past Surgical History:  Procedure Laterality Date  . AXILLARY SURGERY     cyst removal, right  . COLONOSCOPY  09/23/2018   Dr. Havery Moros - polyps  . EYE SURGERY Bilateral    cataracts to remove  . LAPAROSCOPY N/A 11/17/2018   Procedure: LAPAROSCOPY  DIAGNOSTIC, INTRAOPERATIVE ULTRASOUND, PERITONEAL BIOPSIES;  Surgeon: [name], MD;  Location: [facility];  Service: General;  Laterality: N/A;  GENERAL AND EPIDURAL  . LIVER BIOPSY  08/2018   Dr. Lindwood Coke  . MASTECTOMY  1992   right, with flap  . NM MYOCAR PERF WALL MOTION  06/11/2009   Protocol:[place], post stress EF58%, EKG negative for ischemia, low risk  . PORTACATH PLACEMENT N/A 12/02/2018   Procedure: INSERTION PORT-A-CATH;  Surgeon: [name], MD;  Location: [facility];  Service: General;  Laterality: N/A;  . RECONSTRUCTION BREAST W/ TRAM FLAP Right   . TONSILLECTOMY    . TRANSTHORACIC ECHOCARDIOGRAM  12/24/2009   LVEF =>55%, normal study  . UPPER GI ENDOSCOPY      Outpatient Medications Prior to Visit  Medication Sig Dispense Refill  . amLODipine (NORVASC) 10 MG tablet Take 1 tablet (10 mg total) by mouth daily. 90 tablet 3  . aspirin 81 MG tablet Take 81 mg by mouth daily.     . Calcium Carbonate-Vitamin D 600-200 MG-UNIT TABS Take 1 tablet by mouth daily.    Marland Kitchen CALCIUM-MAGNESIUM-VITAMIN D PO Take 1 tablet by mouth once a week.     Marland Kitchen glucose blood test strip 1 each by Other route 2 (two) times daily. Use Onetouch verio test strips as instructed to check blood sugar twice daily. 100 each 2  . lansoprazole (PREVACID) 15 MG capsule Take 1 capsule (15 mg total) by mouth daily. 90 capsule 1  . lidocaine-prilocaine (EMLA) cream Apply to affected area once (Patient taking differently: Apply 1 application topically as needed (for port). ) 90 g 1  . loratadine (CLARITIN) 10 MG tablet Take 10 mg by mouth daily as needed for allergies.    . metoprolol succinate (TOPROL-XL) 100 MG 24 hr tablet Take 1 tablet (100 mg total) by mouth daily. Take with or immediately following a meal. 90 tablet 1  . Omega 3 1000 MG CAPS Take 2,000 mg by mouth daily.     . sitaGLIPtin (JANUVIA) 100 MG tablet Take 1 tablet (100 mg total) by mouth daily. 90 tablet 4  . valsartan (DIOVAN) 320 MG tablet Take 1 tablet  (320 mg total) by mouth daily. 90 tablet 3  . furosemide (LASIX) 40 MG tablet Take 1 tablet (40 mg total) by mouth daily. 30 tablet 1  . potassium chloride SA (KLOR-CON) 10 MEQ tablet Take 1 tablet (10 mEq total) by mouth daily. 90 tablet 1   Facility-Administered Medications Prior to Visit  Medication Dose Route Frequency Provider Last Rate Last Admin  . triamcinolone acetonide (KENALOG) 10 MG/ML injection 10 mg  10 mg Other Once Harriet Masson, DPM         Allergies:   Patient has no known allergies.   Social History   Socioeconomic History  . Marital status: Single    Spouse name: Not on file  . Number of children: 2  .  Years of education: Not on file  . Highest education level: Not on file  Occupational History  . Occupation: retired  Tobacco Use  . Smoking status: Former Smoker    Packs/day: 0.25    Years: 25.00    Pack years: 6.25    Types: Cigarettes    Quit date: 1990    Years since quitting: 31.7  . Smokeless tobacco: Never Used  Vaping Use  . Vaping Use: Never used  Substance and Sexual Activity  . Alcohol use: Not Currently    Alcohol/week: 1.0 standard drink    Types: 1 Glasses of wine per week    Comment: occasional wine  . Drug use: No  . Sexual activity: Yes    Partners: Male    Birth control/protection: Condom, Post-menopausal    Comment: First sexual encounter age 40. Fewer than 5 partners in life time.  Other Topics Concern  . 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   Social Determinants of Health   Financial Resource Strain:   . Difficulty of Paying Living Expenses: Not on file  Food Insecurity: No Food Insecurity  . Worried About Charity fundraiser in the Last Year: Never true  . Ran Out of Food in the Last Year:  Never true  Transportation Needs:   . Lack of Transportation (Medical): Not on file  . Lack of Transportation (Non-Medical): Not on file  Physical Activity:   . Days of Exercise per Week: Not on file  . Minutes of Exercise per Session: Not on file  Stress:   . Feeling of Stress : Not on file  Social Connections:   . Frequency of Communication with Friends and Family: Not on file  . Frequency of Social Gatherings with Friends and Family: Not on file  . Attends Religious Services: Not on file  . Active Member of Clubs or Organizations: Not on file  . Attends Archivist Meetings: Not on file  . Marital Status: Not on file     Family History:  The patient's family history includes Breast cancer in her cousin; Diabetes in her brother; Heart attack in her maternal grandmother; Heart disease in her father and mother; Hypertension in her father and mother; Prostate cancer (age of onset: 41) in her brother; Stroke in her maternal grandfather.   ROS:   Please see the history of present illness.    ROS All other systems are reviewed and are negative.   PHYSICAL EXAM:   VS:  BP (!) 174/74   Pulse 71   Ht 5\' 5"  (1.651 m)   Wt 123 lb (55.8 kg)   LMP  (LMP Unknown)   SpO2 99%   BMI 20.47 kg/m      General: Alert, oriented x3, no distress, very lean Head: no evidence of trauma, PERRL, EOMI, no exophtalmos or lid lag, no myxedema, no xanthelasma; normal ears, nose and oropharynx Neck: normal jugular venous pulsations and no hepatojugular reflux; brisk carotid pulses without delay and no carotid bruits Chest: clear to auscultation, no signs of consolidation by percussion or palpation, normal fremitus, symmetrical and full respiratory excursions Cardiovascular: normal position and quality of the apical impulse, regular rhythm, normal first and second heart sounds, no  murmurs, rubs or gallops Abdomen: no tenderness or distention, no masses by palpation, no abnormal pulsatility or  arterial bruits, normal bowel sounds, no hepatosplenomegaly Extremities: no clubbing, cyanosis or edema; 2+ radial, ulnar and brachial pulses bilaterally; 2+ right femoral, posterior tibial and dorsalis pedis pulses; 2+ left femoral, posterior tibial and dorsalis pedis pulses; no subclavian or femoral bruits Neurological: grossly nonfocal Psych: Normal mood and affect  Wt Readings from Last 3 Encounters:  11/22/19 123 lb (55.8 kg)  11/15/19 129 lb (58.5 kg)  11/01/19 125 lb 14.4 oz (57.1 kg)      Studies/Labs Reviewed:   EKG:  EKG is not ordered today.  The tracing from 11/07/2019 shows shows sinus rhythm, without repolarization abnormalities with relatively high QRS voltage but without secondary changes of LVH.  Recent Labs: 10/19/2019: B Natriuretic Peptide 2,498.8 11/15/2019: ALT <6; BUN 29; Creatinine 1.37; Hemoglobin 8.4; Magnesium 2.1; Platelet Count 217; Potassium 3.9; Sodium 143   Lipid Panel     Component Value Date/Time   CHOL 190 09/01/2018 1435   CHOL 209 (H) 10/07/2017 1251   TRIG 46.0 09/01/2018 1435   HDL 60.50 09/01/2018 1435   HDL 63 10/07/2017 1251   CHOLHDL 3 09/01/2018 1435   VLDL 9.2 09/01/2018 1435   LDLCALC 120 (H) 09/01/2018 1435   LDLCALC 133 (H) 10/07/2017 1251   LABVLDL 13 10/07/2017 1251     09/01/2018 total cholesterol 190, triglycerides 46, HDL 60, LDL 120, A1c 6.4% 01/04/2019 hemoglobin 9.2, creatinine 1.08, potassium 4.0, normal TSH and ALT  ASSESSMENT:    1. Chronic diastolic heart failure ([place])   2. Renovascular hypertension   3. Type 2 diabetes mellitus with stage 3b chronic kidney disease, without long-term current use of insulin ([place])   4. HYPERCHOLESTEROLEMIA   5. Coronary artery calcification   6. Aortic atherosclerosis ([place])   7. Intrahepatic cholangiocarcinoma ([place])      PLAN:  In order of problems listed above:  1. CHF: She has no signs of hypervolemia on physical exam today.  She does need better blood pressure management.   Discussed sodium restriction, daily weight monitoring and the fact that she can take additional loop diuretic as needed for signs of hypervolemia.    It's not clear to me that she was markedly volume overloaded at the time of her episode of pulmonary edema.  I think the anemia was contributory and that she may have flash pulmonary edema from bilateral renal artery stenosis.  Another possibility is that she has painless coronary ischemia due to multivessel CAD.  She is not a great candidate for invasive evaluation for coronary disease and definitely not a good candidate for aggressive surgical revascularization.  Consider adding spironolactone if renal function allows. We will bring her back relatively quickly to reassess her blood pressure and volume status and recheck labs. 2. HTN: Her blood pressure is quite high despite maximum usual doses of amlodipine, ARB, relatively high dose of beta-blocker and loop diuretic.  I think she really needs a thiazide diuretic for blood pressure control.  We will start this is a daily medication and change the furosemide to a twice weekly medication with additional potassium.   3. DM: Excellent control 4. HLP: She has been off her statin since starting chemotherapy.  Although she may have mild hyperlipidemia and underlying vascular disease, not sure what the risk/benefit of this medication is considering her comorbid conditions. 5. Coronary calcification: She does not have angina pectoris and she had a low risk nuclear stress test  2 years ago. 6. Aortic atherosclerosis/PAD: No significant renal artery obstruction, normal ABI. 7. Cholangiocarcinoma: This is not resectable, she is not a candidate for immunotherapy and the prognosis is very guarded.    Medication Adjustments/Labs and Tests Ordered: Current medicines are reviewed at length with the patient today.  Concerns regarding medicines are outlined above.  Medication changes, Labs and Tests ordered today are listed  in the Patient Instructions below. Patient Instructions  Medication Instructions:  START Furosemide 40 mg twice a week TAKE the Potassium 10 mEq on the days you take the Furosemide (twice a week) START Hydrochlorothiazide 12.5 mg once daily  *If you need a refill on your cardiac medications before your next appointment, please call your pharmacy*   Lab Work: None ordered If you have labs (blood work) drawn today and your tests are completely normal, you will receive your results only by: Marland Kitchen MyChart Message (if you have MyChart) OR . A paper copy in the mail If you have any lab test that is abnormal or we need to change your treatment, we will call you to review the results.   Testing/Procedures: None ordered   Follow-Up: At [facility], you and your health needs are our priority.  As part of our continuing mission to provide you with exceptional heart care, we have created designated Provider Care Teams.  These Care Teams include your primary Cardiologist (physician) and Advanced Practice Providers (APPs -  Physician Assistants and Nurse Practitioners) who all work together to provide you with the care you need, when you need it.  We recommend signing up for the patient portal called "MyChart".  Sign up information is provided on this After Visit Summary.  MyChart is used to connect with patients for Virtual Visits (Telemedicine).  Patients are able to view lab/test results, encounter notes, upcoming appointments, etc.  Non-urgent messages can be sent to your provider as well.   To learn more about what you can do with MyChart, go to [email or web address].    Your next appointment:   Follow up in 3-4 weeks with an APP Follow up in 6 months with Dr. Sallyanne Kuster.       Signed, Sanda Klein, MD  11/22/2019 9:27 PM    [address], [place], [place]  [address] Phone: [phone]; Fax: [phone]

## 2019-11-22 NOTE — Patient Instructions (Addendum)
Medication Instructions:  START Furosemide 40 mg twice a week TAKE the Potassium 10 mEq on the days you take the Furosemide (twice a week) START Hydrochlorothiazide 12.5 mg once daily  *If you need a refill on your cardiac medications before your next appointment, please call your pharmacy*   Lab Work: None ordered If you have labs (blood work) drawn today and your tests are completely normal, you will receive your results only by: Marland Kitchen MyChart Message (if you have MyChart) OR . A paper copy in the mail If you have any lab test that is abnormal or we need to change your treatment, we will call you to review the results.   Testing/Procedures: None ordered   Follow-Up: At [facility], you and your health needs are our priority.  As part of our continuing mission to provide you with exceptional heart care, we have created designated Provider Care Teams.  These Care Teams include your primary Cardiologist (physician) and Advanced Practice Providers (APPs -  Physician Assistants and Nurse Practitioners) who all work together to provide you with the care you need, when you need it.  We recommend signing up for the patient portal called "MyChart".  Sign up information is provided on this After Visit Summary.  MyChart is used to connect with patients for Virtual Visits (Telemedicine).  Patients are able to view lab/test results, encounter notes, upcoming appointments, etc.  Non-urgent messages can be sent to your provider as well.   To learn more about what you can do with MyChart, go to [email or web address].    Your next appointment:   Follow up in 3-4 weeks with an APP Follow up in 6 months with Dr. Sallyanne Kuster.

## 2019-11-26 ENCOUNTER — Other Ambulatory Visit: Payer: Self-pay | Admitting: Nurse Practitioner

## 2019-11-26 DIAGNOSIS — G620 Drug-induced polyneuropathy: Secondary | ICD-10-CM

## 2019-11-26 DIAGNOSIS — T451X5A Adverse effect of antineoplastic and immunosuppressive drugs, initial encounter: Secondary | ICD-10-CM

## 2019-11-27 NOTE — Progress Notes (Signed)
Donna May   [phone] [phone]   Clinic Follow up Note   Patient Care Team: [name], MD as PCP - General (Family Medicine) [name], [name], MD as Consulting Physician (Cardiology) [name], MD as Consulting Physician (Obstetrics and Gynecology) [name], [name], NP as Nurse Practitioner (Hematology and Oncology) [facility] [name], MD as Consulting Physician (Hematology) [name], [name], MD as Consulting Physician (Gastroenterology) Virgina Evener, Dawn, RN (Inactive) as Oncology Nurse Navigator [name], MD as Consulting Physician (General Surgery) [name], NP as [place] [facility]  Date of Service:  11/29/2019  CHIEF COMPLAINT: F/u ofcholangiocarcinomaof liver  SUMMARY OF ONCOLOGIC HISTORY: Oncology History  Malignant neoplasm of female breast ([place])  11/06/2018 Genetic Testing   Negative genetic testing on the [facility] Common Hereditary Cancers panel. A variant of uncertain significance was identified in one of her APC genes, called c.1243G>A (p.Ala415Thr).  The Common Hereditary Cancers Panel offered by [facility] includes sequencing and/or deletion duplication testing of the following 48 genes: APC, ATM, AXIN2, BARD1, BMPR1A, BRCA1, BRCA2, BRIP1, CDH1, CDK4, CDKN2A (p14ARF), CDKN2A (p16INK4a), CHEK2, CTNNA1, DICER1, EPCAM (Deletion/duplication testing only), GREM1 (promoter region deletion/duplication testing only), KIT, MEN1, MLH1, MSH2, MSH3, MSH6, MUTYH, NBN, NF1, NHTL1, PALB2, PDGFRA, PMS2, POLD1, POLE, PTEN, RAD50, RAD51C, RAD51D, RNF43, SDHB, SDHC, SDHD, SMAD4, SMARCA4. STK11, TP53, TSC1, TSC2, and VHL.  The following genes were evaluated for sequence changes only: SDHA and HOXB13 c.251G>A variant only.    Intrahepatic cholangiocarcinoma ([place])  09/07/2018 Imaging   CT Chest IMPRESSION: 1. New, enhancing mass involving segment 4 of the liver and fundus of  gallbladder is concerning for malignancy. This may represent either metastatic disease from breast cancer or neoplasm primary to the liver or hepatic biliary tree. Further evaluation with contrast enhanced CT of the abdomen and pelvis is recommended. 2. No findings to suggest metastatic disease within the chest. 3.  Aortic Atherosclerosis (ICD10-I70.0). 4. Coronary artery calcifications.   09/13/2018 Pathology Results   Diagnosis Liver, needle/core biopsy - ADENOCARCINOMA. Microscopic Comment Immunohistochemistry for CK7 is positive. CK20, TTF1, CDX-2, GATA-3, PAX 8, Qualitative ER, p63 and CK5/6 are negative. The provided clinical history of remote mammary carcinoma is noted. Based on the morphology and immunophenotype of the adenocarcinoma observed in this specimen, primary cholangiocarcinoma is favored. Clinical and radiologic correlation are  encouraged. Results reported to [employer] on 09/15/2018. Intradepartmental consultation (Dr. Vic Ripper).   09/13/2018 Initial Diagnosis   Cholangiocarcinoma ([place])   09/23/2018 Procedure   Colonoscopy by Dr. Havery Moros 09/23/18  IMPRESSION - Two 3 to 4 mm polyps in the ascending colon, removed with a cold snare. Resected and retrieved. - Five 3 to 5 mm polyps in the transverse colon, removed with a cold snare. Resected and retrieved. - One 5 mm polyp at the splenic flexure, removed with a cold snare. Resected and retrieved. - Three 3 to 5 mm polyps in the sigmoid colon, removed with a cold snare. Resected and retrieved. - The examination was otherwise normal. Upper Endopscy by Dr. Havery Moros 09/23/18  IMPRESSION - Esophagogastric landmarks identified. - 2 cm hiatal hernia. - Normal esophagus otherwise. - A single gastric polyp. Resected and retrieved. - Mild gastritis. Biopsied. - Normal duodenal bulb and second portion of the duodenum.   09/23/2018 Pathology Results   Diagnosis 09/23/18 1. Surgical [P], duodenum - BENIGN SMALL BOWEL  MUCOSA. - NO ACTIVE INFLAMMATION OR VILLOUS ATROPHY IDENTIFIED. 2. Surgical [P], stomach, polyp - HYPERPLASTIC POLYP(S). - THERE IS NO EVIDENCE  OF MALIGNANCY. 3. Surgical [P], gastric antrum and gastric body - CHRONIC INACTIVE GASTRITIS. - THERE IS NO EVIDENCE OF HELICOBACTER-PYLORI, DYSPLASIA, OR MALIGNANCY. - SEE COMMENT. 4. Surgical [P], colon, sigmoid, splenic flexure, transverse and ascending, polyp (9) - TUBULAR ADENOMA(S). - SESSILE SERRATED POLYP WITHOUT CYTOLOGIC DYSPLASIA. - HIGH GRADE DYSPLASIA IS NOT IDENTIFIED. 5. Surgical [P], colon, sigmoid, polyp (2) - HYPERPLASTIC POLYP(S). - THERE IS NO EVIDENCE OF MALIGNANCY.   09/23/2018 Cancer Staging   Staging form: Intrahepatic Bile Duct, AJCC 8th Edition - Clinical stage from 09/23/2018: Stage IB (cT1b, cN0, cM0) - Signed by Feng, Yan, MD on 10/06/2018   09/29/2018 PET scan   PET 09/29/18 IMPRESSION: 1. Hypermetabolic mass in the RIGHT hepatic lobe consistent with biopsy proven adenocarcinoma. No additional liver metastasis. 2. No evidence of local breast cancer recurrence in the RIGHT breast or RIGHT axilla. 3. Mild bilateral hypermetabolic adrenal glands is favored benign hyperplasia. 4. No evidence of additional metastatic disease on skull base to thigh FDG PET scan.   11/06/2018 Genetic Testing   Negative genetic testing on the [facility] Common Hereditary Cancers panel. A variant of uncertain significance was identified in one of her APC genes, called c.1243G>A (p.Ala415Thr).  The Common Hereditary Cancers Panel offered by [facility] includes sequencing and/or deletion duplication testing of the following 48 genes: APC, ATM, AXIN2, BARD1, BMPR1A, BRCA1, BRCA2, BRIP1, CDH1, CDK4, CDKN2A (p14ARF), CDKN2A (p16INK4a), CHEK2, CTNNA1, DICER1, EPCAM (Deletion/duplication testing only), GREM1 (promoter region deletion/duplication testing only), KIT, MEN1, MLH1, MSH2, MSH3, MSH6, MUTYH, NBN, NF1, NHTL1, PALB2, PDGFRA, PMS2, POLD1, POLE,  PTEN, RAD50, RAD51C, RAD51D, RNF43, SDHB, SDHC, SDHD, SMAD4, SMARCA4. STK11, TP53, TSC1, TSC2, and VHL.  The following genes were evaluated for sequence changes only: SDHA and HOXB13 c.251G>A variant only.    11/17/2018 Pathology Results   Diagnosis 11/17/18 1. Soft tissue, biopsy, Diaphragmatic nodules - METASTATIC ADENOCARCINOMA, CONSISTENT WITH PATIENT'S CLINICAL HISTORY OF CHOLANGIOCARCINOMA. SEE NOTE 2. Liver, biopsy, Left - LIVER PARENCHYMA WITH A BENIGN FIBROTIC NODULE - NO EVIDENCE OF MALIGNANCY 3. Stomach, biopsy - BENIGN PAPILLARY MESOTHELIAL HYPERPLASIA - NO EVIDENCE OF MALIGNANCY   11/29/2018 Imaging   CT CAP WO Contrast  IMPRESSION: 1. Dominant liver mass appears grossly stable from 09/29/2018. Additional liver lesions are too small to characterize but were not shown to be hypermetabolic on PET. 2. Mild nodularity of both adrenal glands with associated hypermetabolism on 09/29/2018. Continued attention on follow-up exams is warranted. 3. Small right lower lobe nodules, stable from 09/07/2018. Again, attention on follow-up is recommended. 4. Trace bilateral pleural fluid. 5. Aortic atherosclerosis (ICD10-170.0). Coronary artery calcification. 6. Enlarged pulmonic trunk, indicative of pulmonary arterial hypertension.     11/30/2018 - 06/14/2019 Chemotherapy   First line chemo Oxaliplatin and gemcitabine q2weeks starting 11/30/18. Stopped Oxaliplatin on 05/03/19 due to worsening neuropathy. Added Cisplatin with C13 on 05/17/19 and stopped on C15 06/14/19 due to neuropathy. Reduced to maintenance single agent Gemcitabine on 06/28/19.    01/20/2019 Imaging   CT CAP IMPRESSION: restaging  1. Mild interval increase in size of dominant liver mass involving segment 4 and segment 5. 2. No significant or progressive adrenal nodularity identified to suggest metastatic disease. 3. Unchanged appearance of small right lower lobe and lingular lung nodules, nonspecific.   03/21/2019 PET scan     IMPRESSION: 1. Large right hepatic mass with SUV uptake near background hepatic activity, dramatic response to therapy, also with decrease in size when compared to the prior study. 2. Signs of prior right mastectomy and axillary dissection   as before. 3. Left adrenal activity in no longer above the level of activity seen in the contralateral, right adrenal gland or in the liver. Attention on follow-up. 4. No new signs of disease.   06/27/2019 PET scan   IMPRESSION: 1. Further decrease in size of a dominant hepatic mass which is non FDG avid. 2. No evidence of metastatic disease. 3. No evidence of left adrenal hypermetabolism or mass. 4. Incidental findings, including: Uterine fibroids. Coronary artery atherosclerosis. Aortic Atherosclerosis (ICD10-I70.0). Pulmonary artery enlargement suggests pulmonary arterial hypertension.   06/28/2019 -  Chemotherapy   Maintenance single agent Gemcitabine q2weeks  starting on 06/28/19 with C16.    09/15/2019 PET scan   IMPRESSION: 1. In the region of regional tumor at the May of the right and left hepatic lobe, there is continued hypodensity and overall activity slightly less than that of the surrounding normal liver, indicating effective response to therapy. No current worrisome hypermetabolic lesion is identified in the liver or elsewhere. 2. Chronic asymmetric edema in the subcutaneous tissues of the right upper extremity, nonspecific. The patient has had a prior right mastectomy and right axillary dissection. 3. Other imaging findings of potential clinical significance: Aortic Atherosclerosis (ICD10-I70.0). Coronary atherosclerosis. Mild cardiomegaly. Small right and trace left pleural effusions. Subcutaneous and mild mesenteric edema. Suspected uterine fibroids.      CURRENT THERAPY:  Maintenance single agent Gemcitabine q2weeks starting on 06/28/19 with C16.  INTERVAL HISTORY:  Donna May is here for a follow up. She  presents to the clinic alone. She notes she is stable. She notes her weight has been fluctuating. She notes she will eat more of the foods she likes and less of the foods she doesn't. She notes she only swelling she has is from her ankle down. She continues to see her cardiologist who adjusted her HCTZ. She is still on Lasix 40mg twice a week. She notes she does not eat at home, she mostly goes out to eat. She notes her neuropathy is overall stable but will occasionally flare. She notes being constipated and plans to use OTC Laxative. She denies headaches, chest discomfort. She does note mild decrease air flow, but notes she can breathe and has not been wheezing lately.     REVIEW OF SYSTEMS:   Constitutional: Denies fevers, chills or abnormal weight loss Eyes: Denies blurriness of vision Ears, nose, mouth, throat, and face: Denies mucositis or sore throat Respiratory: Denies cough, dyspnea or wheezes Cardiovascular: Denies palpitation, chest discomfort (+)  lower extremity swelling at ankles Gastrointestinal:  Denies nausea, heartburn or change in bowel habits Skin: Denies abnormal skin rashes Lymphatics: Denies new lymphadenopathy or easy bruising Neurological: (+) Stable neuropathy  Behavioral/Psych: Mood is stable, no new changes  All other systems were reviewed with the patient and are negative.  MEDICAL HISTORY:  Past Medical History:  Diagnosis Date  . Anemia   . Anxiety   . Arthritis   . Breast cancer (HCC) 1992  . Cataract    Bilateral eyes - surgery to remove  . Chronic kidney disease    CKD stage 3  . Complication of anesthesia    "something they use make me itch for a couple of days."  . Depression   . Diabetes mellitus    type 2  . Duodenitis 01/18/2002  . Fainting spell   . Family history of breast cancer   . Family history of prostate cancer   . GERD (gastroesophageal reflux disease)   . Heart murmur      never has caused any problems  . Hiatal hernia 08/08/2008,  01/18/2002  . History of pneumonia    x 2  . Hyperlipidemia   . Hypertension   . Liver cancer (HCC) 08/2018  . Lymphedema 2017   Right arm  . Pneumonia    x 2  . UTI (lower urinary tract infection)     SURGICAL HISTORY: Past Surgical History:  Procedure Laterality Date  . AXILLARY SURGERY     cyst removal, right  . COLONOSCOPY  09/23/2018   Dr. [name] - polyps  . EYE SURGERY Bilateral    cataracts to remove  . LAPAROSCOPY N/A 11/17/2018   Procedure: LAPAROSCOPY DIAGNOSTIC, INTRAOPERATIVE ULTRASOUND, PERITONEAL BIOPSIES;  Surgeon: Byerly, Faera, MD;  Location: [facility] OR;  Service: General;  Laterality: N/A;  GENERAL AND EPIDURAL  . LIVER BIOPSY  08/2018   Dr. Maganot  . MASTECTOMY  1992   right, with flap  . NM MYOCAR PERF WALL MOTION  06/11/2009   Protocol:Bruce, post stress EF58%, EKG negative for ischemia, low risk  . PORTACATH PLACEMENT N/A 12/02/2018   Procedure: INSERTION PORT-A-CATH;  Surgeon: Byerly, Faera, MD;  Location: [facility] OR;  Service: General;  Laterality: N/A;  . RECONSTRUCTION BREAST W/ TRAM FLAP Right   . TONSILLECTOMY    . TRANSTHORACIC ECHOCARDIOGRAM  12/24/2009   LVEF =>55%, normal study  . UPPER GI ENDOSCOPY      I have reviewed the social history and family history with the patient and they are unchanged from previous note.  ALLERGIES:  has No Known Allergies.  MEDICATIONS:  Current Outpatient Medications  Medication Sig Dispense Refill  . amLODipine (NORVASC) 10 MG tablet Take 1 tablet (10 mg total) by mouth daily. 90 tablet 3  . aspirin 81 MG tablet Take 81 mg by mouth daily.     . Calcium Carbonate-Vitamin D 600-200 MG-UNIT TABS Take 1 tablet by mouth daily.    . CALCIUM-MAGNESIUM-VITAMIN D PO Take 1 tablet by mouth once a week.     . furosemide (LASIX) 40 MG tablet Take 1 tablet (40 mg total) by mouth 2 (two) times a week. 10 tablet 2  . glucose blood test strip 1 each by Other route 2 (two) times daily. Use Onetouch verio test strips as  instructed to check blood sugar twice daily. 100 each 2  . hydrochlorothiazide (MICROZIDE) 12.5 MG capsule Take 1 capsule (12.5 mg total) by mouth daily. 90 capsule 1  . lansoprazole (PREVACID) 15 MG capsule Take 1 capsule (15 mg total) by mouth daily. 90 capsule 1  . lidocaine-prilocaine (EMLA) cream Apply to affected area once (Patient taking differently: Apply 1 application topically as needed (for port). ) 90 g 1  . loratadine (CLARITIN) 10 MG tablet Take 10 mg by mouth daily as needed for allergies.    . metoprolol succinate (TOPROL-XL) 100 MG 24 hr tablet Take 1 tablet (100 mg total) by mouth daily. Take with or immediately following a meal. 90 tablet 1  . Omega 3 1000 MG CAPS Take 2,000 mg by mouth daily.     . potassium chloride (KLOR-CON) 10 MEQ tablet Take 1 tablet (10 mEq total) by mouth 2 (two) times a week. 10 tablet 2  . sitaGLIPtin (JANUVIA) 100 MG tablet Take 1 tablet (100 mg total) by mouth daily. 90 tablet 4  . valsartan (DIOVAN) 320 MG tablet Take 1 tablet (320 mg total) by mouth daily. 90 tablet 3   Current Facility-Administered Medications  Medication Dose Route Frequency Provider Last   Rate Last Admin  . triamcinolone acetonide (KENALOG) 10 MG/ML injection 10 mg  10 mg Other Once Harriet Masson, DPM       Facility-Administered Medications Ordered in Other Visits  Medication Dose Route Frequency Provider Last Rate Last Admin  . sodium chloride flush (NS) 0.9 % injection 10 mL  10 mL Intracatheter PRN [name], MD   10 mL at 11/29/19 1124    PHYSICAL EXAMINATION: ECOG PERFORMANCE STATUS: 2 - Symptomatic, <50% confined to bed  Vitals:   11/29/19 0831  BP: (!) 200/79  Pulse: 65  Resp: 18  Temp: 97.8 F (36.6 C)  SpO2: 100%   Filed Weights   11/29/19 0831  Weight: 128 lb 9.6 oz (58.3 kg)    GENERAL:alert, no distress and comfortable SKIN: skin color, texture, turgor are normal, no rashes or significant lesions EYES: normal, Conjunctiva are pink and  non-injected, sclera clear  NECK: supple, thyroid normal size, non-tender, without nodularity LYMPH:  no palpable lymphadenopathy in the cervical, axillary  LUNGS: clear to auscultation and percussion with normal breathing effort HEART: regular rate & rhythm and no murmurs (+) Mild b/l ankle edema ABDOMEN:abdomen soft, non-tender and normal bowel sounds Musculoskeletal:no cyanosis of digits and no clubbing  NEURO: alert & oriented x 3 with fluent speech, no focal motor/sensory deficits  LABORATORY DATA:  I have reviewed the data as listed CBC Latest Ref Rng & Units 11/29/2019 11/15/2019 11/01/2019  WBC 4.0 - 10.5 K/uL 4.7 5.8 5.9  Hemoglobin 12.0 - 15.0 g/dL 7.7(L) 8.4(L) 7.8(L)  Hematocrit 36 - 46 % 24.2(L) 26.5(L) 25.4(L)  Platelets 150 - 400 K/uL 181 217 262     CMP Latest Ref Rng & Units 11/29/2019 11/15/2019 11/01/2019  Glucose 70 - 99 mg/dL 113(H) 98 98  BUN 8 - 23 mg/dL 28(H) 29(H) 32(H)  Creatinine 0.44 - 1.00 mg/dL 1.59(H) 1.37(H) 1.72(H)  Sodium 135 - 145 mmol/L 143 143 141  Potassium 3.5 - 5.1 mmol/L 4.0 3.9 4.4  Chloride 98 - 111 mmol/L 110 110 109  CO2 22 - 32 mmol/L _0 Calcium 8.9 - 10.3 mg/dL 8.5(L) 9.4 9.1  Total Protein 6.5 - 8.1 g/dL 7.0 6.9 7.1  Total Bilirubin 0.3 - 1.2 mg/dL 0.5 0.5 0.5  Alkaline Phos 38 - 126 U/L 61 64 83  AST 15 - 41 U/L _1 ALT 0 - 44 U/L <6 <6 15      RADIOGRAPHIC STUDIES: I have personally reviewed the radiological images as listed and agreed with the findings in the report. No results found.   ASSESSMENT & PLAN:  Donna May is a 79 y.o. female with    1.Intrahepatic cholangiocarcinoma, cT1N0M1,with peritoneal metastasis, MSS, IDH1 mutation (+) -She was diagnosed in 08/2018. Her biopsy of her liver mass shows adenocarcinoma,most consistent withcholangiocarcinoma.Her EGD/Colonoscopy from 7/10/20was negativeand 09/29/18 PET scanshowedno evidence of distant metastasis. -She was brought to OR on 11/17/18,unfortunately  she was found to have peritoneal metastasis to right diaphragm and surgery was aborted. -Given her metastatic cancer, Istarted her onsystemicchemo withFirst line chemoOxaliplatinand gemcitabine q2weeksto control her disease beginning on 11/30/18.Due to poor tolerance to Oxaliplatin and Cisplatin from neuropathy, she proceeded with agent Gemcitabineevery 2 weeksstarting 06/28/19 with C16.Dose was reduced to 650m/m2 since 09/20/19 due to anemia. -S/p C26 she is overall stable. She is recently constipated, so she is fine to take laxative to have an adequate BM. Labs reviewed and overall adequate to proceed with Gemcitabine today and continues every 2 weeks.  -  Plan to repeat staging PET scan in 3-4 weeks  -F/u in 2 weeks   2. Anemia, Secondary to chemoand cancer -Shepreviouslyrequiredblood transfusionon1/13/21,08/16/19, 10/11/19. Will continue prenatal vitamin.Her MMA was normal (08/09/19).  -Hg 7.7 today (11/29/19). I discussed given recent heart failure, goal is to keep her Hg up. Will proceed with blood transfusion with IV Lasix this week. She is agreeable.   3. Peripheral neuropathy, G3 -secondary to Oxaliplatin and Cisplatin (both d/c)  -Due to low tolerance and not much help, she stopped Lyrica, Gabapentin and Cymbalta.  -She waspreviouslyseen by Dr Mickeal Skinner.She is now being seen byChiropractor Dr Page Spiro.  -She remains to have decreased function overall. Stable with occasional flares.   4. H/o right breast cancer, Geneticsnegative, Osteopenia -s/prightmastectomy in 1992, per patient did not require adjuvant therapy. She was last seen by Dr. Jana Hakim in 2017. -Her 06/2017 DEXA shows mild osteopenia. F/u with Gyn about continuing Raloxifene. I do not think she has to continue at this point.  5.Comorbidities:DM,HTN,hiatal hernia, GERD, renal artery stenosis, recent pulmonary edema -Continue medications and f/u with PCP and cardiologist for management.  -Her  weight has recently fluctuated, she continues to have ankle swelling and her BP is 200/79 today (11/29/19). I discussed increasing Lasix taking it on Tuesday, Thursday, Saturday and Sunday and monitor her weight daily. Continue HCTZ.   6. Family support, Goal of Care Discussion -She has a son and daughterwho are often busy but help as needed. She notes they are aware of her condition. -The patient understands the goal of care is palliative. She is full code now  7.CKD stage III -Continue to monitor.Stable Cr at 1.2-15.   PLAN: -Labs reviewed and adequate to proceed with Gemcitabine todayat same dose -Blood transfusion 1u tomorrow  IV Lasix 78m   -Lab, flush, f/uwith me or APPand Gemcitabine in 2, 4, 6weeks -PET scan in 3-4 weeks.  -will increase her oral lasix to 4 times a week    No problem-specific Assessment & Plan notes found for this encounter.   Orders Placed This Encounter  Procedures  . NM PET Image Restag (PS) Skull Base To Thigh    Standing Status:   Future    Standing Expiration Date:   11/28/2020    Order Specific Question:   If indicated for the ordered procedure, I authorize the administration of a radiopharmaceutical per Radiology protocol    Answer:   Yes    Order Specific Question:   Preferred imaging location?    Answer[facility]   [facility]   Order Specific Question:   Radiology Contrast Protocol - do NOT remove file path    Answer:   [email or web address]  . Care order/instruction    Transfuse Parameters    Standing Status:   Future    Standing Expiration Date:   11/28/2020  . Type and screen         Standing Status:   Future    Standing Expiration Date:   11/28/2020  . Sample to Blood Bank    Standing Status:   Standing    Number of Occurrences:   50    Standing Expiration Date:   11/28/2020   All questions were answered. The patient knows to call the clinic with any problems, questions or concerns. No  barriers to learning was detected. The total time spent in the appointment was 30 minutes.     YTruitt Merle MD 11/29/2019   I, AJoslyn Devon am acting as scribe for YTruitt Merle MD.  I have reviewed the above documentation for accuracy and completeness, and I agree with the above.       

## 2019-11-28 ENCOUNTER — Other Ambulatory Visit: Payer: Self-pay | Admitting: *Deleted

## 2019-11-28 NOTE — Patient Outreach (Signed)
[facility]) Care Management  11/28/2019  Donna May [birth year] [id number]   Telephone follow up for HF.  [name] has been to see her cardiologist. He started her on HCTZ 12.5 mg. He reduced her furosemide to only 2 times a week. She reports her feet are back to swelling more. We discusssed elevated her legs as much as possible during the day so gravity can help minimize her swelling. Also suggested low compression stocking 15-20 Hg and told her where she could order a few pain ([facility] in [place]). She still does not have her scale in working order and I have encouraged her this is really important for self management of HF. She is to ask someone that comes to visit to help see if the batteries are good and that the scale is set on LBS not KGMs. She advised she would work on putting these things in place.  I will call her in 2 weeks.  [name]. [name], MSN, [facility] Nurse Practitioner [facility] Care Management [phone]

## 2019-11-29 ENCOUNTER — Inpatient Hospital Stay: Payer: Medicare Other

## 2019-11-29 ENCOUNTER — Encounter: Payer: Self-pay | Admitting: Hematology

## 2019-11-29 ENCOUNTER — Other Ambulatory Visit: Payer: Self-pay

## 2019-11-29 ENCOUNTER — Inpatient Hospital Stay (HOSPITAL_BASED_OUTPATIENT_CLINIC_OR_DEPARTMENT_OTHER): Payer: Medicare Other | Admitting: Hematology

## 2019-11-29 VITALS — BP 200/79 | HR 65 | Temp 97.8°F | Resp 18 | Ht 65.0 in | Wt 128.6 lb

## 2019-11-29 VITALS — BP 174/67

## 2019-11-29 DIAGNOSIS — C221 Intrahepatic bile duct carcinoma: Secondary | ICD-10-CM

## 2019-11-29 DIAGNOSIS — D6481 Anemia due to antineoplastic chemotherapy: Secondary | ICD-10-CM | POA: Diagnosis not present

## 2019-11-29 DIAGNOSIS — C786 Secondary malignant neoplasm of retroperitoneum and peritoneum: Secondary | ICD-10-CM | POA: Diagnosis not present

## 2019-11-29 DIAGNOSIS — D649 Anemia, unspecified: Secondary | ICD-10-CM

## 2019-11-29 DIAGNOSIS — Z95828 Presence of other vascular implants and grafts: Secondary | ICD-10-CM

## 2019-11-29 DIAGNOSIS — I2510 Atherosclerotic heart disease of native coronary artery without angina pectoris: Secondary | ICD-10-CM

## 2019-11-29 DIAGNOSIS — Z5111 Encounter for antineoplastic chemotherapy: Secondary | ICD-10-CM | POA: Diagnosis not present

## 2019-11-29 DIAGNOSIS — I2584 Coronary atherosclerosis due to calcified coronary lesion: Secondary | ICD-10-CM | POA: Diagnosis not present

## 2019-11-29 DIAGNOSIS — C7989 Secondary malignant neoplasm of other specified sites: Secondary | ICD-10-CM | POA: Diagnosis not present

## 2019-11-29 LAB — CMP (CANCER CENTER ONLY)
ALT: <6 U/L (ref 0–44)
AST: 22 U/L (ref 15–41)
Albumin: 3 g/dL — ABNORMAL LOW (ref 3.5–5.0)
Alkaline Phosphatase: 61 U/L (ref 38–126)
Anion gap: 7 (ref 5–15)
BUN: 28 mg/dL — ABNORMAL HIGH (ref 8–23)
CO2: 26 mmol/L (ref 22–32)
Calcium: 8.5 mg/dL — ABNORMAL LOW (ref 8.9–10.3)
Chloride: 110 mmol/L (ref 98–111)
Creatinine: 1.59 mg/dL — ABNORMAL HIGH (ref 0.44–1.00)
GFR, Est AFR Am: 35 mL/min — ABNORMAL LOW (ref >60)
GFR, Estimated: 30 mL/min — ABNORMAL LOW (ref >60)
Glucose, Bld: 113 mg/dL — ABNORMAL HIGH (ref 70–99)
Potassium: 4 mmol/L (ref 3.5–5.1)
Sodium: 143 mmol/L (ref 135–145)
Total Bilirubin: 0.5 mg/dL (ref 0.3–1.2)
Total Protein: 7 g/dL (ref 6.5–8.1)

## 2019-11-29 LAB — CBC WITH DIFFERENTIAL (CANCER CENTER ONLY)
Abs Immature Granulocytes: 0.02 10*3/uL (ref 0.00–0.07)
Basophils Absolute: 0 10*3/uL (ref 0.0–0.1)
Basophils Relative: 0 %
Eosinophils Absolute: 0.1 10*3/uL (ref 0.0–0.5)
Eosinophils Relative: 2 %
HCT: 24.2 % — ABNORMAL LOW (ref 36.0–46.0)
Hemoglobin: 7.7 g/dL — ABNORMAL LOW (ref 12.0–15.0)
Immature Granulocytes: 0 %
Lymphocytes Relative: 20 %
Lymphs Abs: 1 10*3/uL (ref 0.7–4.0)
MCH: 31.7 pg (ref 26.0–34.0)
MCHC: 31.8 g/dL (ref 30.0–36.0)
MCV: 99.6 fL (ref 80.0–100.0)
Monocytes Absolute: 1 10*3/uL (ref 0.1–1.0)
Monocytes Relative: 21 %
Neutro Abs: 2.7 10*3/uL (ref 1.7–7.7)
Neutrophils Relative %: 57 %
Platelet Count: 181 10*3/uL (ref 150–400)
RBC: 2.43 MIL/uL — ABNORMAL LOW (ref 3.87–5.11)
RDW: 21.6 % — ABNORMAL HIGH (ref 11.5–15.5)
WBC Count: 4.7 10*3/uL (ref 4.0–10.5)
nRBC: 0 % (ref 0.0–0.2)

## 2019-11-29 LAB — MAGNESIUM: Magnesium: 2.1 mg/dL (ref 1.7–2.4)

## 2019-11-29 LAB — SAMPLE TO BLOOD BANK

## 2019-11-29 MED ORDER — SODIUM CHLORIDE 0.9 % IV SOLN
Freq: Once | INTRAVENOUS | Status: AC
  Filled 2019-11-29: qty 250

## 2019-11-29 MED ORDER — SODIUM CHLORIDE 0.9% FLUSH
10.0000 mL | Freq: Once | INTRAVENOUS | Status: AC
  Administered 2019-11-29: 10 mL
  Filled 2019-11-29: qty 10

## 2019-11-29 MED ORDER — HEPARIN SOD (PORK) LOCK FLUSH 100 UNIT/ML IV SOLN
500.0000 [IU] | Freq: Once | INTRAVENOUS | Status: AC | PRN
  Administered 2019-11-29: 500 [IU]
  Filled 2019-11-29: qty 5

## 2019-11-29 MED ORDER — SODIUM CHLORIDE 0.9% FLUSH
10.0000 mL | INTRAVENOUS | Status: DC | PRN
  Administered 2019-11-29: 10 mL
  Filled 2019-11-29: qty 10

## 2019-11-29 MED ORDER — PROCHLORPERAZINE MALEATE 10 MG PO TABS
ORAL_TABLET | ORAL | Status: AC
  Filled 2019-11-29: qty 1

## 2019-11-29 MED ORDER — SODIUM CHLORIDE 0.9 % IV SOLN
600.0000 mg/m2 | Freq: Once | INTRAVENOUS | Status: AC
  Administered 2019-11-29: 988 mg via INTRAVENOUS
  Filled 2019-11-29: qty 25.98

## 2019-11-29 MED ORDER — PROCHLORPERAZINE MALEATE 10 MG PO TABS
10.0000 mg | ORAL_TABLET | Freq: Once | ORAL | Status: AC
  Administered 2019-11-29: 10 mg via ORAL

## 2019-11-29 NOTE — Patient Instructions (Signed)
[facility] Discharge Instructions for Patients Receiving Chemotherapy  Today you received the following chemotherapy agents: gemcitabine.  To help prevent nausea and vomiting after your treatment, we encourage you to take your nausea medication as directed.   If you develop nausea and vomiting that is not controlled by your nausea medication, call the clinic.   BELOW ARE SYMPTOMS THAT SHOULD BE REPORTED IMMEDIATELY:  *FEVER GREATER THAN 100.5 F  *CHILLS WITH OR WITHOUT FEVER  NAUSEA AND VOMITING THAT IS NOT CONTROLLED WITH YOUR NAUSEA MEDICATION  *UNUSUAL SHORTNESS OF BREATH  *UNUSUAL BRUISING OR BLEEDING  TENDERNESS IN MOUTH AND THROAT WITH OR WITHOUT PRESENCE OF ULCERS  *URINARY PROBLEMS  *BOWEL PROBLEMS  UNUSUAL RASH Items with * indicate a potential emergency and should be followed up as soon as possible.  Feel free to call the clinic should you have any questions or concerns. The clinic phone number is [phone].  Please show the CHEMO ALERT CARD at check-in to the Emergency Department and triage nurse.   

## 2019-11-29 NOTE — Progress Notes (Signed)
Per Dr. Burr Medico, ok to treat with elevated creatinine and low hgb. Will plan to transfuse PRBCs tomorrow.

## 2019-11-29 NOTE — Patient Instructions (Signed)

## 2019-11-30 ENCOUNTER — Other Ambulatory Visit: Payer: Self-pay

## 2019-11-30 ENCOUNTER — Telehealth: Payer: Self-pay | Admitting: Hematology

## 2019-11-30 ENCOUNTER — Inpatient Hospital Stay: Payer: Medicare Other

## 2019-11-30 DIAGNOSIS — C221 Intrahepatic bile duct carcinoma: Secondary | ICD-10-CM | POA: Diagnosis not present

## 2019-11-30 DIAGNOSIS — C786 Secondary malignant neoplasm of retroperitoneum and peritoneum: Secondary | ICD-10-CM | POA: Diagnosis not present

## 2019-11-30 DIAGNOSIS — D6481 Anemia due to antineoplastic chemotherapy: Secondary | ICD-10-CM | POA: Diagnosis not present

## 2019-11-30 DIAGNOSIS — D649 Anemia, unspecified: Secondary | ICD-10-CM

## 2019-11-30 DIAGNOSIS — Z5111 Encounter for antineoplastic chemotherapy: Secondary | ICD-10-CM | POA: Diagnosis not present

## 2019-11-30 DIAGNOSIS — C7989 Secondary malignant neoplasm of other specified sites: Secondary | ICD-10-CM | POA: Diagnosis not present

## 2019-11-30 LAB — PREPARE RBC (CROSSMATCH)

## 2019-11-30 MED ORDER — SODIUM CHLORIDE 0.9% FLUSH
10.0000 mL | INTRAVENOUS | Status: AC | PRN
  Administered 2019-11-30: 10 mL
  Filled 2019-11-30: qty 10

## 2019-11-30 MED ORDER — ACETAMINOPHEN 325 MG PO TABS
650.0000 mg | ORAL_TABLET | Freq: Once | ORAL | Status: AC
  Administered 2019-11-30: 650 mg via ORAL

## 2019-11-30 MED ORDER — ACETAMINOPHEN 325 MG PO TABS
ORAL_TABLET | ORAL | Status: AC
  Filled 2019-11-30: qty 2

## 2019-11-30 MED ORDER — HEPARIN SOD (PORK) LOCK FLUSH 100 UNIT/ML IV SOLN
500.0000 [IU] | Freq: Every day | INTRAVENOUS | Status: AC | PRN
  Administered 2019-11-30: 500 [IU]
  Filled 2019-11-30: qty 5

## 2019-11-30 MED ORDER — FUROSEMIDE 10 MG/ML IJ SOLN
20.0000 mg | Freq: Once | INTRAMUSCULAR | Status: AC
  Administered 2019-11-30: 20 mg via INTRAVENOUS

## 2019-11-30 MED ORDER — SODIUM CHLORIDE 0.9% IV SOLUTION
250.0000 mL | Freq: Once | INTRAVENOUS | Status: AC
  Administered 2019-11-30: 250 mL via INTRAVENOUS
  Filled 2019-11-30: qty 250

## 2019-11-30 MED ORDER — DIPHENHYDRAMINE HCL 25 MG PO CAPS
ORAL_CAPSULE | ORAL | Status: AC
  Filled 2019-11-30: qty 1

## 2019-11-30 MED ORDER — DIPHENHYDRAMINE HCL 25 MG PO CAPS
25.0000 mg | ORAL_CAPSULE | Freq: Once | ORAL | Status: AC
  Administered 2019-11-30: 25 mg via ORAL

## 2019-11-30 MED ORDER — FUROSEMIDE 10 MG/ML IJ SOLN
INTRAMUSCULAR | Status: AC
  Filled 2019-11-30: qty 2

## 2019-11-30 NOTE — Patient Instructions (Signed)

## 2019-11-30 NOTE — Telephone Encounter (Signed)
Scheduled per 9/15 los. Messaged RN Brayton Layman to give pt appt calendar during tx.

## 2019-12-01 LAB — TYPE AND SCREEN
ABO/RH(D): A POS
Antibody Screen: NEGATIVE
Unit division: 0

## 2019-12-01 LAB — BPAM RBC
Blood Product Expiration Date: 202109182359
ISSUE DATE / TIME: 202109160912
Unit Type and Rh: 600

## 2019-12-04 ENCOUNTER — Telehealth: Payer: Self-pay

## 2019-12-04 NOTE — Telephone Encounter (Signed)
Called and spoke to [place], Donna May's son, about her blood transfusion this weekend.  Donna May had called and does not feel well and she noticed that she received A-blood on Saturday but she is A+.  I explained to Donna May that she can receive A- blood and it will not harm her.  He mentioned she had chemo recently and that usually makes her feel bad.  I told him I would also call his mother and speak with her.  I called her home number and it was busy and then I tried her cell and it went to voicemail.  I left a detailed message and I also told her I had spoken to her son.  I left my desk phone number on her voicemail in case she wants to call me back. Donna Rhyme, RN

## 2019-12-11 ENCOUNTER — Other Ambulatory Visit: Payer: Self-pay | Admitting: *Deleted

## 2019-12-11 NOTE — Patient Outreach (Signed)
[facility]) Care Management  12/11/2019  Donna May October 25, 1940 [id number]  Telephone outreach, f/u HF.  [name] is feeling good in general. She denies any signs of HF. She still does not have a working scale. She says she has not gained wt. She is not SOB. States she has had wheezing on and off for a long time. She had an inhaler at one time many years ago.  She will see her oncologist soon. She will ask if she can get her COVID booster and flu vaccine.   We agreed to talk again in one month and then we will decide together if she wold like to continue her participation with care management.  [name]. [name], MSN, GNP-BC Gerontological Nurse Practitioner [facility] Care Management [phone]       Scale? Wt?

## 2019-12-12 NOTE — Progress Notes (Signed)
[place]   [phone] [phone]   Clinic Follow up Note   Patient Care Team: [name], MD as PCP - General (Family Medicine) [name], [name], MD as Consulting Physician (Cardiology) [name], MD as Consulting Physician (Obstetrics and Gynecology) [name], [name], NP as Nurse Practitioner (Hematology and Oncology) [facility] [name], MD as Consulting Physician (Hematology) [name], [name], MD as Consulting Physician (Gastroenterology) Virgina Evener, Dawn, RN (Inactive) as Oncology Nurse Navigator [name], MD as Consulting Physician (General Surgery) [name], NP as [place] [facility] 12/13/2019  CHIEF COMPLAINT: Follow-up cholangiocarcinoma of the liver  SUMMARY OF ONCOLOGIC HISTORY: Oncology History  Malignant neoplasm of female breast ([place])  11/06/2018 Genetic Testing   Negative genetic testing on the [facility] Common Hereditary Cancers panel. A variant of uncertain significance was identified in one of her APC genes, called c.1243G>A (p.Ala415Thr).  The Common Hereditary Cancers Panel offered by [facility] includes sequencing and/or deletion duplication testing of the following 48 genes: APC, ATM, AXIN2, BARD1, BMPR1A, BRCA1, BRCA2, BRIP1, CDH1, CDK4, CDKN2A (p14ARF), CDKN2A (p16INK4a), CHEK2, CTNNA1, DICER1, EPCAM (Deletion/duplication testing only), GREM1 (promoter region deletion/duplication testing only), KIT, MEN1, MLH1, MSH2, MSH3, MSH6, MUTYH, NBN, NF1, NHTL1, PALB2, PDGFRA, PMS2, POLD1, POLE, PTEN, RAD50, RAD51C, RAD51D, RNF43, SDHB, SDHC, SDHD, SMAD4, SMARCA4. STK11, TP53, TSC1, TSC2, and VHL.  The following genes were evaluated for sequence changes only: SDHA and HOXB13 c.251G>A variant only.    Intrahepatic cholangiocarcinoma ([place])  09/07/2018 Imaging   CT Chest IMPRESSION: 1. New, enhancing mass involving segment 4 of the liver and fundus of gallbladder is  concerning for malignancy. This may represent either metastatic disease from breast cancer or neoplasm primary to the liver or hepatic biliary tree. Further evaluation with contrast enhanced CT of the abdomen and pelvis is recommended. 2. No findings to suggest metastatic disease within the chest. 3.  Aortic Atherosclerosis (ICD10-I70.0). 4. Coronary artery calcifications.   09/13/2018 Pathology Results   Diagnosis Liver, needle/core biopsy - ADENOCARCINOMA. Microscopic Comment Immunohistochemistry for CK7 is positive. CK20, TTF1, CDX-2, GATA-3, PAX 8, Qualitative ER, p63 and CK5/6 are negative. The provided clinical history of remote mammary carcinoma is noted. Based on the morphology and immunophenotype of the adenocarcinoma observed in this specimen, primary cholangiocarcinoma is favored. Clinical and radiologic correlation are  encouraged. Results reported to [employer] on 09/15/2018. Intradepartmental consultation (Dr. Vic Ripper).   09/13/2018 Initial Diagnosis   Cholangiocarcinoma ([place])   09/23/2018 Procedure   Colonoscopy by Dr. Havery Moros 09/23/18  IMPRESSION - Two 3 to 4 mm polyps in the ascending colon, removed with a cold snare. Resected and retrieved. - Five 3 to 5 mm polyps in the transverse colon, removed with a cold snare. Resected and retrieved. - One 5 mm polyp at the splenic flexure, removed with a cold snare. Resected and retrieved. - Three 3 to 5 mm polyps in the sigmoid colon, removed with a cold snare. Resected and retrieved. - The examination was otherwise normal. Upper Endopscy by Dr. Havery Moros 09/23/18  IMPRESSION - Esophagogastric landmarks identified. - 2 cm hiatal hernia. - Normal esophagus otherwise. - A single gastric polyp. Resected and retrieved. - Mild gastritis. Biopsied. - Normal duodenal bulb and second portion of the duodenum.   09/23/2018 Pathology Results   Diagnosis 09/23/18 1. Surgical [P], duodenum - BENIGN SMALL BOWEL MUCOSA. - NO ACTIVE  INFLAMMATION OR VILLOUS ATROPHY IDENTIFIED. 2. Surgical [P], stomach, polyp - HYPERPLASTIC POLYP(S). - THERE IS NO EVIDENCE OF MALIGNANCY. 3.  Surgical [P], gastric antrum and gastric body - CHRONIC INACTIVE GASTRITIS. - THERE IS NO EVIDENCE OF HELICOBACTER-PYLORI, DYSPLASIA, OR MALIGNANCY. - SEE COMMENT. 4. Surgical [P], colon, sigmoid, splenic flexure, transverse and ascending, polyp (9) - TUBULAR ADENOMA(S). - SESSILE SERRATED POLYP WITHOUT CYTOLOGIC DYSPLASIA. - HIGH GRADE DYSPLASIA IS NOT IDENTIFIED. 5. Surgical [P], colon, sigmoid, polyp (2) - HYPERPLASTIC POLYP(S). - THERE IS NO EVIDENCE OF MALIGNANCY.   09/23/2018 Cancer Staging   Staging form: Intrahepatic Bile Duct, AJCC 8th Edition - Clinical stage from 09/23/2018: Stage IB (cT1b, cN0, cM0) - Signed by [name], MD on 10/06/2018   09/29/2018 PET scan   PET 09/29/18 IMPRESSION: 1. Hypermetabolic mass in the RIGHT hepatic lobe consistent with biopsy proven adenocarcinoma. No additional liver metastasis. 2. No evidence of local breast cancer recurrence in the RIGHT breast or RIGHT axilla. 3. Mild bilateral hypermetabolic adrenal glands is favored benign hyperplasia. 4. No evidence of additional metastatic disease on skull base to thigh FDG PET scan.   11/06/2018 Genetic Testing   Negative genetic testing on the [facility] Common Hereditary Cancers panel. A variant of uncertain significance was identified in one of her APC genes, called c.1243G>A (p.Ala415Thr).  The Common Hereditary Cancers Panel offered by [facility] includes sequencing and/or deletion duplication testing of the following 48 genes: APC, ATM, AXIN2, BARD1, BMPR1A, BRCA1, BRCA2, BRIP1, CDH1, CDK4, CDKN2A (p14ARF), CDKN2A (p16INK4a), CHEK2, CTNNA1, DICER1, EPCAM (Deletion/duplication testing only), GREM1 (promoter region deletion/duplication testing only), KIT, MEN1, MLH1, MSH2, MSH3, MSH6, MUTYH, NBN, NF1, NHTL1, PALB2, PDGFRA, PMS2, POLD1, POLE, PTEN, RAD50, RAD51C,  RAD51D, RNF43, SDHB, SDHC, SDHD, SMAD4, SMARCA4. STK11, TP53, TSC1, TSC2, and VHL.  The following genes were evaluated for sequence changes only: SDHA and HOXB13 c.251G>A variant only.    11/17/2018 Pathology Results   Diagnosis 11/17/18 1. Soft tissue, biopsy, Diaphragmatic nodules - METASTATIC ADENOCARCINOMA, CONSISTENT WITH PATIENT'S CLINICAL HISTORY OF CHOLANGIOCARCINOMA. SEE NOTE 2. Liver, biopsy, Left - LIVER PARENCHYMA WITH A BENIGN FIBROTIC NODULE - NO EVIDENCE OF MALIGNANCY 3. Stomach, biopsy - BENIGN PAPILLARY MESOTHELIAL HYPERPLASIA - NO EVIDENCE OF MALIGNANCY   11/29/2018 Imaging   CT CAP WO Contrast  IMPRESSION: 1. Dominant liver mass appears grossly stable from 09/29/2018. Additional liver lesions are too small to characterize but were not shown to be hypermetabolic on PET. 2. Mild nodularity of both adrenal glands with associated hypermetabolism on 09/29/2018. Continued attention on follow-up exams is warranted. 3. Small right lower lobe nodules, stable from 09/07/2018. Again, attention on follow-up is recommended. 4. Trace bilateral pleural fluid. 5. Aortic atherosclerosis (ICD10-170.0). Coronary artery calcification. 6. Enlarged pulmonic trunk, indicative of pulmonary arterial hypertension.     11/30/2018 - 06/14/2019 Chemotherapy   First line chemo Oxaliplatin and gemcitabine q2weeks starting 11/30/18. Stopped Oxaliplatin on 05/03/19 due to worsening neuropathy. Added Cisplatin with C13 on 05/17/19 and stopped on C15 06/14/19 due to neuropathy. Reduced to maintenance single agent Gemcitabine on 06/28/19.    01/20/2019 Imaging   CT CAP IMPRESSION: restaging  1. Mild interval increase in size of dominant liver mass involving segment 4 and segment 5. 2. No significant or progressive adrenal nodularity identified to suggest metastatic disease. 3. Unchanged appearance of small right lower lobe and lingular lung nodules, nonspecific.   03/21/2019 PET scan   IMPRESSION: 1.  Large right hepatic mass with SUV uptake near background hepatic activity, dramatic response to therapy, also with decrease in size when compared to the prior study. 2. Signs of prior right mastectomy and axillary dissection as before. 3. Left  adrenal activity in no longer above the level of activity seen in the contralateral, right adrenal gland or in the liver. Attention on follow-up. 4. No new signs of disease.   06/27/2019 PET scan   IMPRESSION: 1. Further decrease in size of a dominant hepatic mass which is non FDG avid. 2. No evidence of metastatic disease. 3. No evidence of left adrenal hypermetabolism or mass. 4. Incidental findings, including: Uterine fibroids. Coronary artery atherosclerosis. Aortic Atherosclerosis (ICD10-I70.0). Pulmonary artery enlargement suggests pulmonary arterial hypertension.   06/28/2019 -  Chemotherapy   Maintenance single agent Gemcitabine q2weeks  starting on 06/28/19 with C16.    09/15/2019 PET scan   IMPRESSION: 1. In the region of regional tumor at the junction of the right and left hepatic lobe, there is continued hypodensity and overall activity slightly less than that of the surrounding normal liver, indicating effective response to therapy. No current worrisome hypermetabolic lesion is identified in the liver or elsewhere. 2. Chronic asymmetric edema in the subcutaneous tissues of the right upper extremity, nonspecific. The patient has had a prior right mastectomy and right axillary dissection. 3. Other imaging findings of potential clinical significance: Aortic Atherosclerosis (ICD10-I70.0). Coronary atherosclerosis. Mild cardiomegaly. Small right and trace left pleural effusions. Subcutaneous and mild mesenteric edema. Suspected uterine fibroids.     CURRENT THERAPY:  Maintenance single agent Gemcitabine q2weeks starting on 06/28/19 with C16.  INTERVAL HISTORY: Ms. Nofsinger returns for follow-up and treatment as scheduled.  She  completed cycle 27 gemcitabine on 11/29/2019 and received a blood transfusion on 11/30/2019. She got O neg blood, usually receives A+, and felt worse after transfusion. Since last chemo she has worsening neuropathy, numbness is moving up her legs and feet feel heavy, no fall. Numbness in her hands is also progressed, she cannot turn pages in a book, has difficulty doing her normal daily activities. She has low activity level because of her neuropathy, mostly sedentary. Appetite is normal. Manages constipation with diet. Exertional dyspnea at baseline.  Otherwise denies fever, chills, mucositis, cough, chest pain, abdominal pain, bleeding, N/V, rash, or other concerns.    MEDICAL HISTORY:  Past Medical History:  Diagnosis Date  . Anemia   . Anxiety   . Arthritis   . Breast cancer ([place]) 1992  . Cataract    Bilateral eyes - surgery to remove  . Chronic kidney disease    CKD stage 3  . Complication of anesthesia    "something they use make me itch for a couple of days."  . Depression   . Diabetes mellitus    type 2  . Duodenitis 01/18/2002  . Fainting spell   . Family history of breast cancer   . Family history of prostate cancer   . GERD (gastroesophageal reflux disease)   . Heart murmur    never has caused any problems  . Hiatal hernia 08/08/2008, 01/18/2002  . History of pneumonia    x 2  . Hyperlipidemia   . Hypertension   . Liver cancer ([place]) 08/2018  . Lymphedema 2017   Right arm  . Pneumonia    x 2  . UTI (lower urinary tract infection)     SURGICAL HISTORY: Past Surgical History:  Procedure Laterality Date  . AXILLARY SURGERY     cyst removal, right  . COLONOSCOPY  09/23/2018   Dr. Havery Moros - polyps  . EYE SURGERY Bilateral    cataracts to remove  . LAPAROSCOPY N/A 11/17/2018   Procedure: LAPAROSCOPY DIAGNOSTIC, INTRAOPERATIVE ULTRASOUND, PERITONEAL BIOPSIES;  Surgeon: [name], MD;  Location: [place];  Service: General;  Laterality: N/A;  GENERAL AND EPIDURAL    . LIVER BIOPSY  08/2018   Dr. Lindwood Coke  . MASTECTOMY  1992   right, with flap  . NM MYOCAR PERF WALL MOTION  06/11/2009   Protocol:[place], post stress EF58%, EKG negative for ischemia, low risk  . PORTACATH PLACEMENT N/A 12/02/2018   Procedure: INSERTION PORT-A-CATH;  Surgeon: [name], MD;  Location: [facility];  Service: General;  Laterality: N/A;  . RECONSTRUCTION BREAST W/ TRAM FLAP Right   . TONSILLECTOMY    . TRANSTHORACIC ECHOCARDIOGRAM  12/24/2009   LVEF =>55%, normal study  . UPPER GI ENDOSCOPY      I have reviewed the social history and family history with the patient and they are unchanged from previous note.  ALLERGIES:  has No Known Allergies.  MEDICATIONS:  Current Outpatient Medications  Medication Sig Dispense Refill  . amLODipine (NORVASC) 10 MG tablet Take 1 tablet (10 mg total) by mouth daily. 90 tablet 3  . aspirin 81 MG tablet Take 81 mg by mouth daily.     . Calcium Carbonate-Vitamin D 600-200 MG-UNIT TABS Take 1 tablet by mouth daily.    Marland Kitchen CALCIUM-MAGNESIUM-VITAMIN D PO Take 1 tablet by mouth once a week.     . furosemide (LASIX) 40 MG tablet Take 1 tablet (40 mg total) by mouth 2 (two) times a week. 10 tablet 2  . glucose blood test strip 1 each by Other route 2 (two) times daily. Use Onetouch verio test strips as instructed to check blood sugar twice daily. 100 each 2  . hydrochlorothiazide (MICROZIDE) 12.5 MG capsule Take 1 capsule (12.5 mg total) by mouth daily. 90 capsule 1  . lansoprazole (PREVACID) 15 MG capsule Take 1 capsule (15 mg total) by mouth daily. 90 capsule 1  . lidocaine-prilocaine (EMLA) cream Apply to affected area once (Patient taking differently: Apply 1 application topically as needed (for port). ) 90 g 1  . loratadine (CLARITIN) 10 MG tablet Take 10 mg by mouth daily as needed for allergies.    . metoprolol succinate (TOPROL-XL) 100 MG 24 hr tablet Take 1 tablet (100 mg total) by mouth daily. Take with or immediately following a meal. 90  tablet 1  . Omega 3 1000 MG CAPS Take 2,000 mg by mouth daily.     . potassium chloride (KLOR-CON) 10 MEQ tablet Take 1 tablet (10 mEq total) by mouth 2 (two) times a week. 10 tablet 2  . sitaGLIPtin (JANUVIA) 100 MG tablet Take 1 tablet (100 mg total) by mouth daily. 90 tablet 4  . valsartan (DIOVAN) 320 MG tablet Take 1 tablet (320 mg total) by mouth daily. 90 tablet 3   Current Facility-Administered Medications  Medication Dose Route Frequency Provider Last Rate Last Admin  . triamcinolone acetonide (KENALOG) 10 MG/ML injection 10 mg  10 mg Other Once Harriet Masson, DPM       Facility-Administered Medications Ordered in Other Visits  Medication Dose Route Frequency Provider Last Rate Last Admin  . heparin lock flush 100 unit/mL  250 Units Intracatheter PRN Cira Rue K, NP      . sodium chloride flush (NS) 0.9 % injection 3 mL  3 mL Intracatheter PRN Alla Feeling, NP        PHYSICAL EXAMINATION: ECOG PERFORMANCE STATUS: 1 - Symptomatic but completely ambulatory  Vitals:   12/13/19 0830  BP: (!) 197/77  Pulse: 70  Resp: 19  Temp: 98.5 F (36.9 C)  SpO2: 100%   Filed Weights   12/13/19 0830  Weight: 127 lb 4.8 oz (57.7 kg)    GENERAL:alert, no distress and comfortable SKIN: no rash to exposed skin  EYES: sclera clear LUNGS: clear with normal breathing effort HEART: regular rate & rhythm, mild pitting lower extremity edema NEURO: alert & oriented x 3 with fluent speech, decreased sensation in the lower extremities, moderately decreased vibratory sense over the fingertips per tuning fork exam PAC without erythema  LABORATORY DATA:  I have reviewed the data as listed CBC Latest Ref Rng & Units 12/13/2019 11/29/2019 11/15/2019  WBC 4.0 - 10.5 K/uL 6.1 4.7 5.8  Hemoglobin 12.0 - 15.0 g/dL 6.9(LL) 7.7(L) 8.4(L)  Hematocrit 36 - 46 % 21.6(L) 24.2(L) 26.5(L)  Platelets 150 - 400 K/uL 237 181 217     CMP Latest Ref Rng & Units 12/13/2019 11/29/2019 11/15/2019  Glucose 70 -  99 mg/dL 145(H) 113(H) 98  BUN 8 - 23 mg/dL 29(H) 28(H) 29(H)  Creatinine 0.44 - 1.00 mg/dL 1.78(H) 1.59(H) 1.37(H)  Sodium 135 - 145 mmol/L 141 143 143  Potassium 3.5 - 5.1 mmol/L 3.8 4.0 3.9  Chloride 98 - 111 mmol/L 109 110 110  CO2 22 - 32 mmol/L $RemoveB'24 26 25  'vVxvMnOy$ Calcium 8.9 - 10.3 mg/dL 8.2(L) 8.5(L) 9.4  Total Protein 6.5 - 8.1 g/dL 6.6 7.0 6.9  Total Bilirubin 0.3 - 1.2 mg/dL 0.6 0.5 0.5  Alkaline Phos 38 - 126 U/L 54 61 64  AST 15 - 41 U/L $Remo'21 22 23  'FUqbY$ ALT 0 - 44 U/L 7 <6 <6      RADIOGRAPHIC STUDIES: I have personally reviewed the radiological images as listed and agreed with the findings in the report. No results found.   ASSESSMENT & PLAN: Donna May   1.Intrahepatic cholangiocarcinoma, cT1N0M1,with peritoneal metastasis, MSS, IDH1 mutation (+) -Diagnosed in 09/2018 biopsy of her liver mass shows adenocarcinoma,most consistent withcholangiocarcinoma -09/29/18 PET scanshowedno evidence of distant metastasis. -Unfortunately she was found to have peritoneal metastasis to right diaphragm and surgery was aborted. -HerCT AP from 9/15/20shows dominate liver mass stable to mild increase, mild nodularity of both adrenal glands which warrants being monitored.No visible peritoneal metastasis on CT scan. -Given her metastatic cancer and ineligibility forsurgery,she began first-line systemic chemotherapy for disease control. -FO resultsshowed MSI stable disease, IDH mutation positive.Consider IDH 1 inhibitor in the future.Not a candidate for immunotherapy -She beganfirst lineoxaliplatin(due to CKD)and gemcitabine every 2 weeks on 11/30/2018 -she responded very well to treatment and tolerated without significant toxicities except progressive neuropathy. Oxali was dose reduced and eventually stopped after cycle 11. She received single agent gemcitabine with cycle 12.Cisplatin added with C13 on 05/17/19 but neuropathy worsened and stopped on 06/14/19.    -She has been receiving single agent gemcitabine every 2 weeks starting from 06/28/19 C16, tolerating well.  PET on 09/15/2019 showed stable disease.  -Her anemia worsened on chemotherapy, gemcitabine dose reduced to 600 mg per metered squared since 09/20/2019.  She gets periodic blood transfusion with Lasix  2. Recent pulmonary edema  -continue cardiology f/u -weight fluctuates -received IV lasix with RBCs and takes 40 mg few times per week   3. H/o right breast cancer, Geneticsnegative -s/p mastectomy in 1992, per patient did not require adjuvant therapy  -previouslyfollowed by Dr. Jana Hakim -VUS of gene APC;genetics otherwise normal  4.Comorbidities:DM, HTN, hiatal hernia, GERD, renal artery stenosis -Follow-up with PCP, nephrology, and Dr. Dwyane Dee -Crfluctuates, overall much improved fromJune- Sept this  year (1.78 at highest). -I encouraged her to hydrate and avoid nephrotoxic agents.  5. Family support  -she has a son and daughterwho are supportive. -patient takes pride in remaining independent, but has become more reluctant to drive due to CIPN in her feet  6.Goal of care discussion  -Wefrequently discussthe goal of her treatment is palliative and that hercancer is not curable with chemo alone at this stage. The goal is to control her disease, prolong her life, and give her good quality of life. She understands -she is full code now  6. CIPN, G3 -secondary to Oxaliplatin, eventually dc'd after cycle 11 -started cisplatin on 05/17/19, neuropathy worsened in her feet with unsteady gait and functional limitations, decreased vibratory sense on tuning fork exam 05/31/19.Cisplatin was dose reducedthen eventually stopped on 06/14/19. -low efficacy of gabapentin and cymbalta.  She completed PT for balance. Ambulates with cane to avoid fall -she was seen by Dr. Mickeal Skinner, f/u open now -she is interested in second opinion at [facility], I will f/u  Disposition: Ms. Antony appears  stable.  She completed another cycle of single agent gemcitabine.  He tolerates treatment well except she has progressive anemia and neuropathy.  She is intolerant to gabapentin, Cymbalta, and Lyrica.  She declined a follow-up with Dr. Mickeal Skinner, prefers for me to follow-up on the previous referral to [facility] for second opinion.   We reviewed the CBC and CMP from today.  Hgb 6.9, no signs of bleeding.  Given her recent heart failure, goal is to keep hemoglobin up.  She will receive 2 units PRBCs today with IV Lasix, she will continue 40 mg Lasix 3 times per week at home.  The plan is to hold chemo today for anemia and progressive neuropathy, transfuse 2 units pRBCs, and follow-up next week after PET scan, and treatment. The plan was reviewed with Dr. Burr Medico.     Orders Placed This Encounter  Procedures  . Informed Consent Details: Physician/Practitioner Attestation; Transcribe to consent form and obtain patient signature    Standing Status:   Future    Standing Expiration Date:   12/12/2020    Order Specific Question:   Physician/Practitioner attestation of informed consent for blood and or blood product transfusion    Answer:   I, the physician/practitioner, attest that I have discussed with the patient the benefits, risks, side effects, alternatives, likelihood of achieving goals and potential problems during recovery for the procedure that I have provided informed consent.    Order Specific Question:   Product(s)    Answer:   All Product(s)  . Care order/instruction    Transfuse Parameters    Standing Status:   Future    Standing Expiration Date:   12/12/2020  . Type and screen    Standing Status:   Future    Number of Occurrences:   1    Standing Expiration Date:   12/12/2020   All questions were answered. The patient knows to call the clinic with any problems, questions or concerns. No barriers to learning were detected. Total encounter time was 30 minutes.      Alla Feeling, NP 12/13/19

## 2019-12-13 ENCOUNTER — Telehealth: Payer: Self-pay

## 2019-12-13 ENCOUNTER — Ambulatory Visit: Payer: Medicare Other

## 2019-12-13 ENCOUNTER — Inpatient Hospital Stay: Payer: Medicare Other

## 2019-12-13 ENCOUNTER — Inpatient Hospital Stay (HOSPITAL_BASED_OUTPATIENT_CLINIC_OR_DEPARTMENT_OTHER): Payer: Medicare Other | Admitting: Nurse Practitioner

## 2019-12-13 ENCOUNTER — Other Ambulatory Visit: Payer: Self-pay

## 2019-12-13 ENCOUNTER — Encounter: Payer: Self-pay | Admitting: Nurse Practitioner

## 2019-12-13 VITALS — BP 197/77 | HR 70 | Temp 98.5°F | Resp 19 | Ht 65.0 in | Wt 127.3 lb

## 2019-12-13 DIAGNOSIS — C7989 Secondary malignant neoplasm of other specified sites: Secondary | ICD-10-CM | POA: Diagnosis not present

## 2019-12-13 DIAGNOSIS — D649 Anemia, unspecified: Secondary | ICD-10-CM | POA: Diagnosis not present

## 2019-12-13 DIAGNOSIS — C786 Secondary malignant neoplasm of retroperitoneum and peritoneum: Secondary | ICD-10-CM | POA: Diagnosis not present

## 2019-12-13 DIAGNOSIS — Z95828 Presence of other vascular implants and grafts: Secondary | ICD-10-CM

## 2019-12-13 DIAGNOSIS — D6481 Anemia due to antineoplastic chemotherapy: Secondary | ICD-10-CM | POA: Diagnosis not present

## 2019-12-13 DIAGNOSIS — C221 Intrahepatic bile duct carcinoma: Secondary | ICD-10-CM | POA: Diagnosis not present

## 2019-12-13 DIAGNOSIS — Z5111 Encounter for antineoplastic chemotherapy: Secondary | ICD-10-CM | POA: Diagnosis not present

## 2019-12-13 LAB — CMP (CANCER CENTER ONLY)
ALT: 7 U/L (ref 0–44)
AST: 21 U/L (ref 15–41)
Albumin: 2.8 g/dL — ABNORMAL LOW (ref 3.5–5.0)
Alkaline Phosphatase: 54 U/L (ref 38–126)
Anion gap: 8 (ref 5–15)
BUN: 29 mg/dL — ABNORMAL HIGH (ref 8–23)
CO2: 24 mmol/L (ref 22–32)
Calcium: 8.2 mg/dL — ABNORMAL LOW (ref 8.9–10.3)
Chloride: 109 mmol/L (ref 98–111)
Creatinine: 1.78 mg/dL — ABNORMAL HIGH (ref 0.44–1.00)
GFR, Est AFR Am: 31 mL/min — ABNORMAL LOW (ref >60)
GFR, Estimated: 26 mL/min — ABNORMAL LOW (ref >60)
Glucose, Bld: 145 mg/dL — ABNORMAL HIGH (ref 70–99)
Potassium: 3.8 mmol/L (ref 3.5–5.1)
Sodium: 141 mmol/L (ref 135–145)
Total Bilirubin: 0.6 mg/dL (ref 0.3–1.2)
Total Protein: 6.6 g/dL (ref 6.5–8.1)

## 2019-12-13 LAB — CBC WITH DIFFERENTIAL (CANCER CENTER ONLY)
Abs Immature Granulocytes: 0.04 10*3/uL (ref 0.00–0.07)
Basophils Absolute: 0 10*3/uL (ref 0.0–0.1)
Basophils Relative: 0 %
Eosinophils Absolute: 0.1 10*3/uL (ref 0.0–0.5)
Eosinophils Relative: 1 %
HCT: 21.6 % — ABNORMAL LOW (ref 36.0–46.0)
Hemoglobin: 6.9 g/dL — CL (ref 12.0–15.0)
Immature Granulocytes: 1 %
Lymphocytes Relative: 18 %
Lymphs Abs: 1.1 10*3/uL (ref 0.7–4.0)
MCH: 32.1 pg (ref 26.0–34.0)
MCHC: 31.9 g/dL (ref 30.0–36.0)
MCV: 100.5 fL — ABNORMAL HIGH (ref 80.0–100.0)
Monocytes Absolute: 0.8 10*3/uL (ref 0.1–1.0)
Monocytes Relative: 13 %
Neutro Abs: 4.2 10*3/uL (ref 1.7–7.7)
Neutrophils Relative %: 67 %
Platelet Count: 237 10*3/uL (ref 150–400)
RBC: 2.15 MIL/uL — ABNORMAL LOW (ref 3.87–5.11)
RDW: 23.7 % — ABNORMAL HIGH (ref 11.5–15.5)
WBC Count: 6.1 10*3/uL (ref 4.0–10.5)
nRBC: 0 % (ref 0.0–0.2)

## 2019-12-13 LAB — SAMPLE TO BLOOD BANK

## 2019-12-13 LAB — MAGNESIUM: Magnesium: 2.3 mg/dL (ref 1.7–2.4)

## 2019-12-13 LAB — PREPARE RBC (CROSSMATCH)

## 2019-12-13 MED ORDER — SODIUM CHLORIDE 0.9% FLUSH
10.0000 mL | INTRAVENOUS | Status: AC | PRN
  Administered 2019-12-13: 10 mL
  Filled 2019-12-13: qty 10

## 2019-12-13 MED ORDER — SODIUM CHLORIDE 0.9% FLUSH
3.0000 mL | INTRAVENOUS | Status: DC | PRN
  Filled 2019-12-13: qty 10

## 2019-12-13 MED ORDER — SODIUM CHLORIDE 0.9% IV SOLUTION
250.0000 mL | Freq: Once | INTRAVENOUS | Status: AC
  Administered 2019-12-13: 250 mL via INTRAVENOUS
  Filled 2019-12-13: qty 250

## 2019-12-13 MED ORDER — FUROSEMIDE 10 MG/ML IJ SOLN
INTRAMUSCULAR | Status: AC
  Filled 2019-12-13: qty 2

## 2019-12-13 MED ORDER — DIPHENHYDRAMINE HCL 25 MG PO CAPS
25.0000 mg | ORAL_CAPSULE | Freq: Once | ORAL | Status: AC
  Administered 2019-12-13: 25 mg via ORAL

## 2019-12-13 MED ORDER — DIPHENHYDRAMINE HCL 25 MG PO CAPS
ORAL_CAPSULE | ORAL | Status: AC
  Filled 2019-12-13: qty 1

## 2019-12-13 MED ORDER — HEPARIN SOD (PORK) LOCK FLUSH 100 UNIT/ML IV SOLN
500.0000 [IU] | Freq: Every day | INTRAVENOUS | Status: AC | PRN
  Administered 2019-12-13: 500 [IU]
  Filled 2019-12-13: qty 5

## 2019-12-13 MED ORDER — ACETAMINOPHEN 325 MG PO TABS
650.0000 mg | ORAL_TABLET | Freq: Once | ORAL | Status: AC
  Administered 2019-12-13: 650 mg via ORAL

## 2019-12-13 MED ORDER — FUROSEMIDE 10 MG/ML IJ SOLN
10.0000 mg | Freq: Once | INTRAMUSCULAR | Status: AC
  Administered 2019-12-13: 10 mg via INTRAVENOUS

## 2019-12-13 MED ORDER — SODIUM CHLORIDE 0.9% FLUSH
10.0000 mL | Freq: Once | INTRAVENOUS | Status: AC
  Administered 2019-12-13: 10 mL
  Filled 2019-12-13: qty 10

## 2019-12-13 MED ORDER — ACETAMINOPHEN 325 MG PO TABS
ORAL_TABLET | ORAL | Status: AC
  Filled 2019-12-13: qty 2

## 2019-12-13 MED ORDER — HEPARIN SOD (PORK) LOCK FLUSH 100 UNIT/ML IV SOLN
250.0000 [IU] | INTRAVENOUS | Status: DC | PRN
  Filled 2019-12-13: qty 5

## 2019-12-13 NOTE — Progress Notes (Signed)
Per Regan Rakers, Patient is OK to be discharged with current BP, patient is asymptomatic and instructed by Lacie to take Lasix at home.

## 2019-12-13 NOTE — Progress Notes (Signed)
Ok to treat with Creatinine of 1.78 per Lacie.

## 2019-12-13 NOTE — Progress Notes (Signed)
Critical Value: Hgb 6.9 Cira Rue, NP notified

## 2019-12-13 NOTE — Patient Instructions (Signed)

## 2019-12-13 NOTE — Telephone Encounter (Signed)
1144 am.  Palliative Care RN outreach call to check on patient status and assist with any additional support needed  No answer on the home phone but message has been left.  PLAN: Awaiting call back.  I will reach out to patient again next month if no call back is received.

## 2019-12-13 NOTE — Patient Instructions (Signed)

## 2019-12-14 ENCOUNTER — Telehealth: Payer: Self-pay

## 2019-12-14 ENCOUNTER — Telehealth: Payer: Self-pay | Admitting: Hematology

## 2019-12-14 LAB — BPAM RBC
Blood Product Expiration Date: 202110162359
Blood Product Expiration Date: 202110162359
ISSUE DATE / TIME: 202109291036
ISSUE DATE / TIME: 202109291036
Unit Type and Rh: 6200
Unit Type and Rh: 6200

## 2019-12-14 LAB — TYPE AND SCREEN
ABO/RH(D): A POS
Antibody Screen: NEGATIVE
Unit division: 0
Unit division: 0

## 2019-12-14 NOTE — Telephone Encounter (Signed)
Made appointment to have PET done per [facility] NP. Spoke with Peggy in central scheduling. Patient is scheduled for PET scan on 12/18/19 at 7am. Patient was called and made aware of appointment and appointment instructions on being NPO 6hrs prior to PET scan. Patient verbalized understanding.

## 2019-12-14 NOTE — Telephone Encounter (Signed)
-----   Message from Alla Feeling, NP sent at 12/14/2019  1:47 PM EDT ----- Please call scheduling to see if PET can be done on or before 10/6.   Thanks, Regan Rakers  ----- Message ----- From: Aundria Rud Sent: 12/12/2019   2:04 PM EDT To: Alla Feeling, NP  Referral Authorized ----- Message ----- From: Alla Feeling, NP Sent: 12/12/2019   1:22 PM EDT To: Aundria Rud  Please help to get her PET approved, to be done in next 1-2 weeks.   Thanks, Regan Rakers

## 2019-12-14 NOTE — Telephone Encounter (Signed)
Scheduled per 9/29 secure from [facility]. Pt is aware of appts added on 10/6.

## 2019-12-18 ENCOUNTER — Ambulatory Visit (HOSPITAL_COMMUNITY)
Admission: RE | Admit: 2019-12-18 | Discharge: 2019-12-18 | Disposition: A | Discharge status: Home / Self Care | Payer: Medicare Other | Source: Ambulatory Visit | Attending: Hematology | Admitting: Hematology

## 2019-12-18 ENCOUNTER — Other Ambulatory Visit: Payer: Self-pay

## 2019-12-18 DIAGNOSIS — D259 Leiomyoma of uterus, unspecified: Secondary | ICD-10-CM | POA: Insufficient documentation

## 2019-12-18 DIAGNOSIS — N281 Cyst of kidney, acquired: Secondary | ICD-10-CM | POA: Diagnosis not present

## 2019-12-18 DIAGNOSIS — I700 Atherosclerosis of aorta: Secondary | ICD-10-CM | POA: Diagnosis not present

## 2019-12-18 DIAGNOSIS — I517 Cardiomegaly: Secondary | ICD-10-CM | POA: Diagnosis not present

## 2019-12-18 DIAGNOSIS — C221 Intrahepatic bile duct carcinoma: Secondary | ICD-10-CM | POA: Diagnosis not present

## 2019-12-18 DIAGNOSIS — I2510 Atherosclerotic heart disease of native coronary artery without angina pectoris: Secondary | ICD-10-CM | POA: Diagnosis not present

## 2019-12-18 LAB — GLUCOSE, CAPILLARY: Glucose-Capillary: 78 mg/dL (ref 70–99)

## 2019-12-18 NOTE — Progress Notes (Signed)
[place]   Telephone:(336) [phone] Fax:(336) [phone]   Clinic Follow up Note   Patient Care Team: [name], MD as PCP - General (Family Medicine) [name], [name], MD as Consulting Physician (Cardiology) [name], MD as Consulting Physician (Obstetrics and Gynecology) [name], [name], NP as Nurse Practitioner (Hematology and Oncology) [facility] [name], MD as Consulting Physician (Hematology) [name], [name], MD as Consulting Physician (Gastroenterology) Virgina Evener, Dawn, RN (Inactive) as Oncology Nurse Navigator [name], MD as Consulting Physician (General Surgery) [name], NP as [facility]  Date of Service:  12/20/2019  CHIEF COMPLAINT: F/u ofcholangiocarcinomaof liver  SUMMARY OF ONCOLOGIC HISTORY: Oncology History Overview Note  Cancer Staging Intrahepatic cholangiocarcinoma ([place]) Staging form: Intrahepatic Bile Duct, AJCC 8th Edition - Clinical stage from 09/23/2018: Stage IB (cT1b, cN0, cM0) - Signed by [name], MD on 10/06/2018    Malignant neoplasm of female breast ([place])  11/06/2018 Genetic Testing   Negative genetic testing on the [facility] Common Hereditary Cancers panel. A variant of uncertain significance was identified in one of her APC genes, called c.1243G>A (p.Ala415Thr).  The Common Hereditary Cancers Panel offered by [facility] includes sequencing and/or deletion duplication testing of the following 48 genes: APC, ATM, AXIN2, BARD1, BMPR1A, BRCA1, BRCA2, BRIP1, CDH1, CDK4, CDKN2A (p14ARF), CDKN2A (p16INK4a), CHEK2, CTNNA1, DICER1, EPCAM (Deletion/duplication testing only), GREM1 (promoter region deletion/duplication testing only), KIT, MEN1, MLH1, MSH2, MSH3, MSH6, MUTYH, NBN, NF1, NHTL1, PALB2, PDGFRA, PMS2, POLD1, POLE, PTEN, RAD50, RAD51C, RAD51D, RNF43, SDHB, SDHC, SDHD, SMAD4, SMARCA4. STK11, TP53, TSC1, TSC2, and VHL.  The following genes were evaluated  for sequence changes only: SDHA and HOXB13 c.251G>A variant only.    Intrahepatic cholangiocarcinoma ([place])  09/07/2018 Imaging   CT Chest IMPRESSION: 1. New, enhancing mass involving segment 4 of the liver and fundus of gallbladder is concerning for malignancy. This may represent either metastatic disease from breast cancer or neoplasm primary to the liver or hepatic biliary tree. Further evaluation with contrast enhanced CT of the abdomen and pelvis is recommended. 2. No findings to suggest metastatic disease within the chest. 3.  Aortic Atherosclerosis (ICD10-I70.0). 4. Coronary artery calcifications.   09/13/2018 Pathology Results   Diagnosis Liver, needle/core biopsy - ADENOCARCINOMA. Microscopic Comment Immunohistochemistry for CK7 is positive. CK20, TTF1, CDX-2, GATA-3, PAX 8, Qualitative ER, p63 and CK5/6 are negative. The provided clinical history of remote mammary carcinoma is noted. Based on the morphology and immunophenotype of the adenocarcinoma observed in this specimen, primary cholangiocarcinoma is favored. Clinical and radiologic correlation are  encouraged. Results reported to [employer] on 09/15/2018. Intradepartmental consultation (Dr. Vic Ripper).   09/13/2018 Initial Diagnosis   Cholangiocarcinoma ([place])   09/23/2018 Procedure   Colonoscopy by Dr. Havery Moros 09/23/18  IMPRESSION - Two 3 to 4 mm polyps in the ascending colon, removed with a cold snare. Resected and retrieved. - Five 3 to 5 mm polyps in the transverse colon, removed with a cold snare. Resected and retrieved. - One 5 mm polyp at the splenic flexure, removed with a cold snare. Resected and retrieved. - Three 3 to 5 mm polyps in the sigmoid colon, removed with a cold snare. Resected and retrieved. - The examination was otherwise normal. Upper Endopscy by Dr. Havery Moros 09/23/18  IMPRESSION - Esophagogastric landmarks identified. - 2 cm hiatal hernia. - Normal esophagus otherwise. - A single gastric  polyp. Resected and retrieved. - Mild gastritis. Biopsied. - Normal duodenal bulb and second portion of the duodenum.   09/23/2018  Pathology Results   Diagnosis 09/23/18 1. Surgical [P], duodenum - BENIGN SMALL BOWEL MUCOSA. - NO ACTIVE INFLAMMATION OR VILLOUS ATROPHY IDENTIFIED. 2. Surgical [P], stomach, polyp - HYPERPLASTIC POLYP(S). - THERE IS NO EVIDENCE OF MALIGNANCY. 3. Surgical [P], gastric antrum and gastric body - CHRONIC INACTIVE GASTRITIS. - THERE IS NO EVIDENCE OF HELICOBACTER-PYLORI, DYSPLASIA, OR MALIGNANCY. - SEE COMMENT. 4. Surgical [P], colon, sigmoid, splenic flexure, transverse and ascending, polyp (9) - TUBULAR ADENOMA(S). - SESSILE SERRATED POLYP WITHOUT CYTOLOGIC DYSPLASIA. - HIGH GRADE DYSPLASIA IS NOT IDENTIFIED. 5. Surgical [P], colon, sigmoid, polyp (2) - HYPERPLASTIC POLYP(S). - THERE IS NO EVIDENCE OF MALIGNANCY.   09/23/2018 Cancer Staging   Staging form: Intrahepatic Bile Duct, AJCC 8th Edition - Clinical stage from 09/23/2018: Stage IB (cT1b, cN0, cM0) - Signed by [name], MD on 10/06/2018   09/29/2018 PET scan   PET 09/29/18 IMPRESSION: 1. Hypermetabolic mass in the RIGHT hepatic lobe consistent with biopsy proven adenocarcinoma. No additional liver metastasis. 2. No evidence of local breast cancer recurrence in the RIGHT breast or RIGHT axilla. 3. Mild bilateral hypermetabolic adrenal glands is favored benign hyperplasia. 4. No evidence of additional metastatic disease on skull base to thigh FDG PET scan.   11/06/2018 Genetic Testing   Negative genetic testing on the [facility] Common Hereditary Cancers panel. A variant of uncertain significance was identified in one of her APC genes, called c.1243G>A (p.Ala415Thr).  The Common Hereditary Cancers Panel offered by [facility] includes sequencing and/or deletion duplication testing of the following 48 genes: APC, ATM, AXIN2, BARD1, BMPR1A, BRCA1, BRCA2, BRIP1, CDH1, CDK4, CDKN2A (p14ARF), CDKN2A  (p16INK4a), CHEK2, CTNNA1, DICER1, EPCAM (Deletion/duplication testing only), GREM1 (promoter region deletion/duplication testing only), KIT, MEN1, MLH1, MSH2, MSH3, MSH6, MUTYH, NBN, NF1, NHTL1, PALB2, PDGFRA, PMS2, POLD1, POLE, PTEN, RAD50, RAD51C, RAD51D, RNF43, SDHB, SDHC, SDHD, SMAD4, SMARCA4. STK11, TP53, TSC1, TSC2, and VHL.  The following genes were evaluated for sequence changes only: SDHA and HOXB13 c.251G>A variant only.    11/17/2018 Pathology Results   Diagnosis 11/17/18 1. Soft tissue, biopsy, Diaphragmatic nodules - METASTATIC ADENOCARCINOMA, CONSISTENT WITH PATIENT'S CLINICAL HISTORY OF CHOLANGIOCARCINOMA. SEE NOTE 2. Liver, biopsy, Left - LIVER PARENCHYMA WITH A BENIGN FIBROTIC NODULE - NO EVIDENCE OF MALIGNANCY 3. Stomach, biopsy - BENIGN PAPILLARY MESOTHELIAL HYPERPLASIA - NO EVIDENCE OF MALIGNANCY   11/29/2018 Imaging   CT CAP WO Contrast  IMPRESSION: 1. Dominant liver mass appears grossly stable from 09/29/2018. Additional liver lesions are too small to characterize but were not shown to be hypermetabolic on PET. 2. Mild nodularity of both adrenal glands with associated hypermetabolism on 09/29/2018. Continued attention on follow-up exams is warranted. 3. Small right lower lobe nodules, stable from 09/07/2018. Again, attention on follow-up is recommended. 4. Trace bilateral pleural fluid. 5. Aortic atherosclerosis (ICD10-170.0). Coronary artery calcification. 6. Enlarged pulmonic trunk, indicative of pulmonary arterial hypertension.     11/30/2018 - 06/14/2019 Chemotherapy   First line chemo Oxaliplatin and gemcitabine q2weeks starting 11/30/18. Stopped Oxaliplatin on 05/03/19 due to worsening neuropathy. Added Cisplatin with C13 on 05/17/19 and stopped on C15 06/14/19 due to neuropathy. Reduced to maintenance single agent Gemcitabine on 06/28/19.    01/20/2019 Imaging   CT CAP IMPRESSION: restaging  1. Mild interval increase in size of dominant liver mass  involving segment 4 and segment 5. 2. No significant or progressive adrenal nodularity identified to suggest metastatic disease. 3. Unchanged appearance of small right lower lobe and lingular lung nodules, nonspecific.   03/21/2019 PET scan   IMPRESSION: 1.  Large right hepatic mass with SUV uptake near background hepatic activity, dramatic response to therapy, also with decrease in size when compared to the prior study. 2. Signs of prior right mastectomy and axillary dissection as before. 3. Left adrenal activity in no longer above the level of activity seen in the contralateral, right adrenal gland or in the liver. Attention on follow-up. 4. No new signs of disease.   06/27/2019 PET scan   IMPRESSION: 1. Further decrease in size of a dominant hepatic mass which is non FDG avid. 2. No evidence of metastatic disease. 3. No evidence of left adrenal hypermetabolism or mass. 4. Incidental findings, including: Uterine fibroids. Coronary artery atherosclerosis. Aortic Atherosclerosis (ICD10-I70.0). Pulmonary artery enlargement suggests pulmonary arterial hypertension.   06/28/2019 -  Chemotherapy   Maintenance single agent Gemcitabine q2weeks  starting on 06/28/19 with C16.    09/15/2019 PET scan   IMPRESSION: 1. In the region of regional tumor at the junction of the right and left hepatic lobe, there is continued hypodensity and overall activity slightly less than that of the surrounding normal liver, indicating effective response to therapy. No current worrisome hypermetabolic lesion is identified in the liver or elsewhere. 2. Chronic asymmetric edema in the subcutaneous tissues of the right upper extremity, nonspecific. The patient has had a prior right mastectomy and right axillary dissection. 3. Other imaging findings of potential clinical significance: Aortic Atherosclerosis (ICD10-I70.0). Coronary atherosclerosis. Mild cardiomegaly. Small right and trace left pleural  effusions. Subcutaneous and mild mesenteric edema. Suspected uterine fibroids.   12/18/2019 PET scan   IMPRESSION: 1. No significant abnormal activity to suggest active/recurrent malignancy. 2. Lingual tonsillar activity is mildly increased from prior but probably incidental/physiologic, attention on follow up suggested. 3. Other imaging findings of potential clinical significance: Third spacing of fluid with trace ascites, mesenteric edema, subcutaneous edema, and possibly subtle pulmonary edema. Mild cardiomegaly. Aortic Atherosclerosis (ICD10-I70.0). Coronary atherosclerosis. Right ovarian dermoid. Calcified uterine fibroids. Renal cysts. Right glenohumeral arthropathy. Mild to moderate scoliosis. Suspected pulmonary arterial hypertension.      CURRENT THERAPY:  Maintenance single agent Gemcitabine q2weeks starting on 06/28/19 with C16.  INTERVAL HISTORY:  Donna May is here for a follow up. She presents to the clinic alone. She notes she is doing fine. She had severe anemia and required blood transfusion on 12/06/19 and 12/18/19 and last cycle chemo was held. She feels she was given the wrong blood type with 12/06/19 dose because she felt sick still. She notes her neuropathy is stable.    REVIEW OF SYSTEMS:   Constitutional: Denies fevers, chills or abnormal weight loss Eyes: Denies blurriness of vision Ears, nose, mouth, throat, and face: Denies mucositis or sore throat Respiratory: Denies cough, dyspnea or wheezes Cardiovascular: Denies palpitation, chest discomfort or lower extremity swelling Gastrointestinal:  Denies nausea, heartburn or change in bowel habits Skin: Denies abnormal skin rashes Lymphatics: Denies new lymphadenopathy or easy bruising Neurological: (+) Stable neuropathy  Behavioral/Psych: Mood is stable, no new changes  All other systems were reviewed with the patient and are negative.  MEDICAL HISTORY:  Past Medical History:  Diagnosis Date  . Anemia    . Anxiety   . Arthritis   . Breast cancer ([place]) 1992  . Cataract    Bilateral eyes - surgery to remove  . Chronic kidney disease    CKD stage 3  . Complication of anesthesia    "something they use make me itch for a couple of days."  . Depression   . Diabetes  mellitus    type 2  . Duodenitis 01/18/2002  . Fainting spell   . Family history of breast cancer   . Family history of prostate cancer   . GERD (gastroesophageal reflux disease)   . Heart murmur    never has caused any problems  . Hiatal hernia 08/08/2008, 01/18/2002  . History of pneumonia    x 2  . Hyperlipidemia   . Hypertension   . Liver cancer ([facility]) 08/2018  . Lymphedema 2017   Right arm  . Pneumonia    x 2  . UTI (lower urinary tract infection)     SURGICAL HISTORY: Past Surgical History:  Procedure Laterality Date  . AXILLARY SURGERY     cyst removal, right  . COLONOSCOPY  09/23/2018   Dr. Adela Lank - polyps  . EYE SURGERY Bilateral    cataracts to remove  . LAPAROSCOPY N/A 11/17/2018   Procedure: LAPAROSCOPY DIAGNOSTIC, INTRAOPERATIVE ULTRASOUND, PERITONEAL BIOPSIES;  Surgeon: Almond Lint, MD;  Location: [facility] OR;  Service: General;  Laterality: N/A;  GENERAL AND EPIDURAL  . LIVER BIOPSY  08/2018   Dr. Rip Harbour  . MASTECTOMY  1992   right, with flap  . NM MYOCAR PERF WALL MOTION  06/11/2009   Protocol:[place]ruce, post stress EF58%, EKG negative for ischemia, low risk  . PORTACATH PLACEMENT N/A 12/02/2018   Procedure: INSERTION PORT-A-CATH;  Surgeon: Almond Lint, MD;  Location: [facility] OR;  Service: General;  Laterality: N/A;  . RECONSTRUCTION BREAST W/ TRAM FLAP Right   . TONSILLECTOMY    . TRANSTHORACIC ECHOCARDIOGRAM  12/24/2009   LVEF =>55%, normal study  . UPPER GI ENDOSCOPY      I have reviewed the social history and family history with the patient and they are unchanged from previous note.  ALLERGIES:  has No Known Allergies.  MEDICATIONS:  Current Outpatient Medications  Medication Sig Dispense  Refill  . amLODipine (NORVASC) 10 MG tablet Take 1 tablet (10 mg total) by mouth daily. 90 tablet 3  . aspirin 81 MG tablet Take 81 mg by mouth daily.     . Calcium Carbonate-Vitamin D 600-200 MG-UNIT TABS Take 1 tablet by mouth daily.    Marland Kitchen CALCIUM-MAGNESIUM-VITAMIN D PO Take 1 tablet by mouth once a week.     . furosemide (LASIX) 40 MG tablet Take 1 tablet (40 mg total) by mouth 2 (two) times a week. 10 tablet 2  . glucose blood test strip 1 each by Other route 2 (two) times daily. Use Onetouch verio test strips as instructed to check blood sugar twice daily. 100 each 2  . hydrochlorothiazide (MICROZIDE) 12.5 MG capsule Take 1 capsule (12.5 mg total) by mouth daily. 90 capsule 1  . lansoprazole (PREVACID) 15 MG capsule Take 1 capsule (15 mg total) by mouth daily. 90 capsule 1  . lidocaine-prilocaine (EMLA) cream Apply to affected area once (Patient taking differently: Apply 1 application topically as needed (for port). ) 90 g 1  . loratadine (CLARITIN) 10 MG tablet Take 10 mg by mouth daily as needed for allergies.    . metoprolol succinate (TOPROL-XL) 100 MG 24 hr tablet Take 1 tablet (100 mg total) by mouth daily. Take with or immediately following a meal. 90 tablet 1  . Omega 3 1000 MG CAPS Take 2,000 mg by mouth daily.     . potassium chloride (KLOR-CON) 10 MEQ tablet Take 1 tablet (10 mEq total) by mouth 2 (two) times a week. 10 tablet 2  . sitaGLIPtin (JANUVIA) 100 MG  tablet Take 1 tablet (100 mg total) by mouth daily. 90 tablet 4  . valsartan (DIOVAN) 320 MG tablet Take 1 tablet (320 mg total) by mouth daily. 90 tablet 3   Current Facility-Administered Medications  Medication Dose Route Frequency Provider Last Rate Last Admin  . triamcinolone acetonide (KENALOG) 10 MG/ML injection 10 mg  10 mg Other Once Harriet Masson, DPM        PHYSICAL EXAMINATION: ECOG PERFORMANCE STATUS: 2 - Symptomatic, <50% confined to bed  Vitals:   12/20/19 1307  BP: (!) 202/91  Pulse: 74  Resp: 20   Temp: 97.8 F (36.6 C)  SpO2: 97%   Filed Weights   12/20/19 1307  Weight: 127 lb 12.8 oz (58 kg)    Due to COVID19 we will limit examination to appearance. Patient had no complaints.  GENERAL:alert, no distress and comfortable SKIN: skin color normal, no rashes or significant lesions EYES: normal, Conjunctiva are pink and non-injected, sclera clear  NEURO: alert & oriented x 3 with fluent speech    LABORATORY DATA:  I have reviewed the data as listed CBC Latest Ref Rng & Units 12/20/2019 12/13/2019 11/29/2019  WBC 4.0 - 10.5 K/uL 4.7 6.1 4.7  Hemoglobin 12.0 - 15.0 g/dL 9.8(L) 6.9(LL) 7.7(L)  Hematocrit 36 - 46 % 30.4(L) 21.6(L) 24.2(L)  Platelets 150 - 400 K/uL 270 237 181     CMP Latest Ref Rng & Units 12/20/2019 12/13/2019 11/29/2019  Glucose 70 - 99 mg/dL 73 145(H) 113(H)  BUN 8 - 23 mg/dL 33(H) 29(H) 28(H)  Creatinine 0.44 - 1.00 mg/dL 1.49(H) 1.78(H) 1.59(H)  Sodium 135 - 145 mmol/L 141 141 143  Potassium 3.5 - 5.1 mmol/L 3.5 3.8 4.0  Chloride 98 - 111 mmol/L 109 109 110  CO2 22 - 32 mmol/L $RemoveB'26 24 26  'yBIMpYGO$ Calcium 8.9 - 10.3 mg/dL 8.9 8.2(L) 8.5(L)  Total Protein 6.5 - 8.1 g/dL 6.8 6.6 7.0  Total Bilirubin 0.3 - 1.2 mg/dL 0.7 0.6 0.5  Alkaline Phos 38 - 126 U/L 55 54 61  AST 15 - 41 U/L $Remo'24 21 22  'RLxiq$ ALT 0 - 44 U/L 8 7 <6      RADIOGRAPHIC STUDIES: I have personally reviewed the radiological images as listed and agreed with the findings in the report. No results found.   ASSESSMENT & PLAN:  BESAN KETCHEM is a 79 y.o. female with   1.Intrahepatic cholangiocarcinoma, cT1N0M1,with peritoneal metastasis, MSS, IDH1 mutation (+) -She was diagnosed in 08/2018. Her biopsy of her liver mass shows adenocarcinoma,most consistent withcholangiocarcinoma.Her EGD/Colonoscopy from 7/10/20was negativeand 09/29/18 PET scanshowedno evidence of distant metastasis. -She was brought to OR on 11/17/18,unfortunately she was found to have peritoneal metastasis to right diaphragm and  surgery was aborted. -Given her metastatic cancer, Istarted her onsystemicchemo withFirst line chemoOxaliplatinand gemcitabine q2weeksto control her disease beginning on 11/30/18.Due to poor tolerance to Oxaliplatin and Cisplatin from neuropathy, she proceeded with agent Gemcitabineevery 2 weeksstarting 06/28/19 with C16.Dose was reduced to $RemoveBe'600mg'AjOhUaCMo$ /m2 since 09/20/19 due to anemia. -I personally reviewed and discussed her PET images from 12/18/19 with pt which shows No hypermetabolic activity to suggest active/recurrent malignancy. She is responding to current treatment well.  -I discussed the prior metastatic lesions of her diaphragm are not visible on scans. I discussed it is possible she has reached near complete response. I discussed stopping chemo for now and proceed with observation given her poor tolerance to chemo. Will continue to monitor her cancer with scan. Will restart systemic treatment if cancer progresses. She  will think about it and may discuss with her family. She is more concerned about cancer progression if she stops chemo and would like to continue current treatment for now  -She agrees to hold treatment today and resume treatment next week -F/u with NP Lacie on 10/27  2. Anemia, Secondary to chemoand cancer -Shepreviouslyrequiredblood transfusionsince 03/2019, continue as needed. Will continue prenatal vitamin. -Labs blood transfusion 12/06/19 and 12/18/19. Hg improved to 9.8 today (12/20/19).   3. Peripheral neuropathy, G3 -secondary to Oxaliplatin and Cisplatin (both d/c)  -Due to low tolerance and not much help, she stopped Donna May,Gabapentinand Donna May. -She waspreviouslyseen by Dr Mickeal Skinner.She is now being seen byChiropractor Dr Page Spiro.  -She remains to have decreased function overall.Stable with occasional flares. Stable.   4. H/o right breast cancer, Geneticsnegative, Osteopenia -s/prightmastectomy in 1992, per patient did not require  adjuvant therapy. She was last seen by Dr. Jana Hakim in 2017. -Her 06/2017 DEXA shows mild osteopenia. F/u with Gyn about continuing Raloxifene. I do not think she has to continue at this point.  5.Comorbidities:DM,HTN,hiatal hernia, GERD, renal artery stenosis, recent pulmonary edema -Continue medications and f/u with PCP and cardiologist for management.  -Her weight has recently fluctuated, she continues to have ankle swelling and her BP is 200/79 today (11/29/19). I discussed increasing Lasix taking it on Tuesday, Thursday, Saturday and Sunday and monitor her weight daily. Continue HCTZ.   6. Family support, Goal of Care Discussion -She has a son and daughterwho are often busy but help as needed. She notes they are aware of her condition. -The patient understands the goal of care is palliative. She is full code now  7.CKD stage III -Continue to monitor.Stable Cr at 1.2-15.   PLAN: -No chemo today  -Proceed with flu shot today  -PET scan reviewed, no active disease  -Labs, flush, Gemcitabine on 10/13 -Lab, Flush, F/u with NP Laice and Gemcitabine 10/27 -Gave her disability paperwork   No problem-specific Assessment & Plan notes found for this encounter.   No orders of the defined types were placed in this encounter.  All questions were answered. The patient knows to call the clinic with any problems, questions or concerns. No barriers to learning was detected. The total time spent in the appointment was 30 minutes.     [name], MD 12/20/2019   I, Joslyn Devon, am acting as scribe for [name], MD.   I have reviewed the above documentation for accuracy and completeness, and I agree with the above.

## 2019-12-20 ENCOUNTER — Inpatient Hospital Stay: Payer: Medicare Other

## 2019-12-20 ENCOUNTER — Other Ambulatory Visit: Payer: Self-pay

## 2019-12-20 ENCOUNTER — Encounter: Payer: Self-pay | Admitting: Hematology

## 2019-12-20 ENCOUNTER — Inpatient Hospital Stay: Payer: Medicare Other | Attending: Adult Health | Admitting: Hematology

## 2019-12-20 VITALS — BP 202/91 | HR 74 | Temp 97.8°F | Resp 20 | Ht 65.0 in | Wt 127.8 lb

## 2019-12-20 DIAGNOSIS — C786 Secondary malignant neoplasm of retroperitoneum and peritoneum: Secondary | ICD-10-CM | POA: Diagnosis not present

## 2019-12-20 DIAGNOSIS — E1122 Type 2 diabetes mellitus with diabetic chronic kidney disease: Secondary | ICD-10-CM | POA: Diagnosis not present

## 2019-12-20 DIAGNOSIS — Z171 Estrogen receptor negative status [ER-]: Secondary | ICD-10-CM

## 2019-12-20 DIAGNOSIS — T451X5A Adverse effect of antineoplastic and immunosuppressive drugs, initial encounter: Secondary | ICD-10-CM

## 2019-12-20 DIAGNOSIS — C221 Intrahepatic bile duct carcinoma: Secondary | ICD-10-CM

## 2019-12-20 DIAGNOSIS — Z23 Encounter for immunization: Secondary | ICD-10-CM

## 2019-12-20 DIAGNOSIS — C50911 Malignant neoplasm of unspecified site of right female breast: Secondary | ICD-10-CM

## 2019-12-20 DIAGNOSIS — Z5111 Encounter for antineoplastic chemotherapy: Secondary | ICD-10-CM | POA: Diagnosis not present

## 2019-12-20 DIAGNOSIS — G620 Drug-induced polyneuropathy: Secondary | ICD-10-CM | POA: Diagnosis not present

## 2019-12-20 DIAGNOSIS — Z95828 Presence of other vascular implants and grafts: Secondary | ICD-10-CM

## 2019-12-20 DIAGNOSIS — I2510 Atherosclerotic heart disease of native coronary artery without angina pectoris: Secondary | ICD-10-CM | POA: Diagnosis not present

## 2019-12-20 DIAGNOSIS — N1832 Chronic kidney disease, stage 3b: Secondary | ICD-10-CM

## 2019-12-20 DIAGNOSIS — D6481 Anemia due to antineoplastic chemotherapy: Secondary | ICD-10-CM | POA: Insufficient documentation

## 2019-12-20 DIAGNOSIS — I2584 Coronary atherosclerosis due to calcified coronary lesion: Secondary | ICD-10-CM | POA: Diagnosis not present

## 2019-12-20 LAB — CMP (CANCER CENTER ONLY)
ALT: 8 U/L (ref 0–44)
AST: 24 U/L (ref 15–41)
Albumin: 2.9 g/dL — ABNORMAL LOW (ref 3.5–5.0)
Alkaline Phosphatase: 55 U/L (ref 38–126)
Anion gap: 6 (ref 5–15)
BUN: 33 mg/dL — ABNORMAL HIGH (ref 8–23)
CO2: 26 mmol/L (ref 22–32)
Calcium: 8.9 mg/dL (ref 8.9–10.3)
Chloride: 109 mmol/L (ref 98–111)
Creatinine: 1.49 mg/dL — ABNORMAL HIGH (ref 0.44–1.00)
GFR, Estimated: 33 mL/min — ABNORMAL LOW (ref >60)
Glucose, Bld: 73 mg/dL (ref 70–99)
Potassium: 3.5 mmol/L (ref 3.5–5.1)
Sodium: 141 mmol/L (ref 135–145)
Total Bilirubin: 0.7 mg/dL (ref 0.3–1.2)
Total Protein: 6.8 g/dL (ref 6.5–8.1)

## 2019-12-20 LAB — CBC WITH DIFFERENTIAL (CANCER CENTER ONLY)
Abs Immature Granulocytes: 0.01 10*3/uL (ref 0.00–0.07)
Basophils Absolute: 0 10*3/uL (ref 0.0–0.1)
Basophils Relative: 1 %
Eosinophils Absolute: 0.1 10*3/uL (ref 0.0–0.5)
Eosinophils Relative: 2 %
HCT: 30.4 % — ABNORMAL LOW (ref 36.0–46.0)
Hemoglobin: 9.8 g/dL — ABNORMAL LOW (ref 12.0–15.0)
Immature Granulocytes: 0 %
Lymphocytes Relative: 19 %
Lymphs Abs: 0.9 10*3/uL (ref 0.7–4.0)
MCH: 31.5 pg (ref 26.0–34.0)
MCHC: 32.2 g/dL (ref 30.0–36.0)
MCV: 97.7 fL (ref 80.0–100.0)
Monocytes Absolute: 0.7 10*3/uL (ref 0.1–1.0)
Monocytes Relative: 16 %
Neutro Abs: 2.9 10*3/uL (ref 1.7–7.7)
Neutrophils Relative %: 62 %
Platelet Count: 270 10*3/uL (ref 150–400)
RBC: 3.11 MIL/uL — ABNORMAL LOW (ref 3.87–5.11)
RDW: 19.5 % — ABNORMAL HIGH (ref 11.5–15.5)
WBC Count: 4.7 10*3/uL (ref 4.0–10.5)
nRBC: 0 % (ref 0.0–0.2)

## 2019-12-20 LAB — MAGNESIUM: Magnesium: 2.1 mg/dL (ref 1.7–2.4)

## 2019-12-20 MED ORDER — SODIUM CHLORIDE 0.9% FLUSH
10.0000 mL | Freq: Once | INTRAVENOUS | Status: AC
  Administered 2019-12-20: 10 mL
  Filled 2019-12-20: qty 10

## 2019-12-20 MED ORDER — HEPARIN SOD (PORK) LOCK FLUSH 100 UNIT/ML IV SOLN
500.0000 [IU] | Freq: Once | INTRAVENOUS | Status: AC
  Administered 2019-12-20: 500 [IU]
  Filled 2019-12-20: qty 5

## 2019-12-20 MED ORDER — INFLUENZA VAC A&B SA ADJ QUAD 0.5 ML IM PRSY
PREFILLED_SYRINGE | INTRAMUSCULAR | Status: AC
  Filled 2019-12-20: qty 0.5

## 2019-12-20 MED ORDER — INFLUENZA VAC A&B SA ADJ QUAD 0.5 ML IM PRSY
0.5000 mL | PREFILLED_SYRINGE | Freq: Once | INTRAMUSCULAR | Status: AC
  Administered 2019-12-20: 0.5 mL via INTRAMUSCULAR

## 2019-12-20 NOTE — Patient Instructions (Signed)

## 2019-12-27 ENCOUNTER — Other Ambulatory Visit: Payer: Self-pay

## 2019-12-27 ENCOUNTER — Inpatient Hospital Stay: Payer: Medicare Other | Admitting: Hematology

## 2019-12-27 ENCOUNTER — Inpatient Hospital Stay: Payer: Medicare Other

## 2019-12-27 ENCOUNTER — Other Ambulatory Visit: Payer: Self-pay | Admitting: Hematology

## 2019-12-27 ENCOUNTER — Inpatient Hospital Stay (HOSPITAL_COMMUNITY)
Admission: EM | Admit: 2019-12-27 | Discharge: 2020-01-05 | DRG: 291 | Disposition: A | Discharge status: Home Health | Payer: Medicare Other | Attending: Family Medicine | Admitting: Family Medicine

## 2019-12-27 ENCOUNTER — Emergency Department (HOSPITAL_COMMUNITY): Payer: Medicare Other

## 2019-12-27 VITALS — BP 197/68 | HR 61 | Temp 98.4°F

## 2019-12-27 DIAGNOSIS — I10 Essential (primary) hypertension: Secondary | ICD-10-CM | POA: Diagnosis not present

## 2019-12-27 DIAGNOSIS — J9601 Acute respiratory failure with hypoxia: Secondary | ICD-10-CM | POA: Diagnosis not present

## 2019-12-27 DIAGNOSIS — R0602 Shortness of breath: Secondary | ICD-10-CM | POA: Diagnosis not present

## 2019-12-27 DIAGNOSIS — T451X5A Adverse effect of antineoplastic and immunosuppressive drugs, initial encounter: Secondary | ICD-10-CM | POA: Diagnosis present

## 2019-12-27 DIAGNOSIS — Z20822 Contact with and (suspected) exposure to covid-19: Secondary | ICD-10-CM | POA: Diagnosis present

## 2019-12-27 DIAGNOSIS — E1129 Type 2 diabetes mellitus with other diabetic kidney complication: Secondary | ICD-10-CM | POA: Diagnosis present

## 2019-12-27 DIAGNOSIS — Z87891 Personal history of nicotine dependence: Secondary | ICD-10-CM

## 2019-12-27 DIAGNOSIS — I491 Atrial premature depolarization: Secondary | ICD-10-CM | POA: Diagnosis not present

## 2019-12-27 DIAGNOSIS — N184 Chronic kidney disease, stage 4 (severe): Secondary | ICD-10-CM | POA: Diagnosis present

## 2019-12-27 DIAGNOSIS — I110 Hypertensive heart disease with heart failure: Secondary | ICD-10-CM | POA: Diagnosis not present

## 2019-12-27 DIAGNOSIS — Z803 Family history of malignant neoplasm of breast: Secondary | ICD-10-CM

## 2019-12-27 DIAGNOSIS — R0902 Hypoxemia: Secondary | ICD-10-CM | POA: Diagnosis not present

## 2019-12-27 DIAGNOSIS — Z823 Family history of stroke: Secondary | ICD-10-CM

## 2019-12-27 DIAGNOSIS — C221 Intrahepatic bile duct carcinoma: Secondary | ICD-10-CM

## 2019-12-27 DIAGNOSIS — Z7984 Long term (current) use of oral hypoglycemic drugs: Secondary | ICD-10-CM

## 2019-12-27 DIAGNOSIS — Z853 Personal history of malignant neoplasm of breast: Secondary | ICD-10-CM

## 2019-12-27 DIAGNOSIS — J189 Pneumonia, unspecified organism: Secondary | ICD-10-CM | POA: Diagnosis not present

## 2019-12-27 DIAGNOSIS — N1832 Chronic kidney disease, stage 3b: Secondary | ICD-10-CM | POA: Diagnosis not present

## 2019-12-27 DIAGNOSIS — Z833 Family history of diabetes mellitus: Secondary | ICD-10-CM

## 2019-12-27 DIAGNOSIS — Z79899 Other long term (current) drug therapy: Secondary | ICD-10-CM

## 2019-12-27 DIAGNOSIS — Z7982 Long term (current) use of aspirin: Secondary | ICD-10-CM

## 2019-12-27 DIAGNOSIS — I5033 Acute on chronic diastolic (congestive) heart failure: Secondary | ICD-10-CM | POA: Diagnosis not present

## 2019-12-27 DIAGNOSIS — E785 Hyperlipidemia, unspecified: Secondary | ICD-10-CM | POA: Diagnosis present

## 2019-12-27 DIAGNOSIS — J9600 Acute respiratory failure, unspecified whether with hypoxia or hypercapnia: Secondary | ICD-10-CM | POA: Diagnosis present

## 2019-12-27 DIAGNOSIS — N183 Chronic kidney disease, stage 3 unspecified: Secondary | ICD-10-CM | POA: Diagnosis present

## 2019-12-27 DIAGNOSIS — J811 Chronic pulmonary edema: Secondary | ICD-10-CM | POA: Diagnosis not present

## 2019-12-27 DIAGNOSIS — E1122 Type 2 diabetes mellitus with diabetic chronic kidney disease: Secondary | ICD-10-CM

## 2019-12-27 DIAGNOSIS — R5381 Other malaise: Secondary | ICD-10-CM | POA: Diagnosis present

## 2019-12-27 DIAGNOSIS — I130 Hypertensive heart and chronic kidney disease with heart failure and stage 1 through stage 4 chronic kidney disease, or unspecified chronic kidney disease: Secondary | ICD-10-CM | POA: Diagnosis not present

## 2019-12-27 DIAGNOSIS — Z8042 Family history of malignant neoplasm of prostate: Secondary | ICD-10-CM

## 2019-12-27 DIAGNOSIS — Z8701 Personal history of pneumonia (recurrent): Secondary | ICD-10-CM

## 2019-12-27 DIAGNOSIS — D6481 Anemia due to antineoplastic chemotherapy: Secondary | ICD-10-CM | POA: Diagnosis present

## 2019-12-27 DIAGNOSIS — I160 Hypertensive urgency: Secondary | ICD-10-CM | POA: Diagnosis present

## 2019-12-27 DIAGNOSIS — I5023 Acute on chronic systolic (congestive) heart failure: Secondary | ICD-10-CM | POA: Diagnosis not present

## 2019-12-27 DIAGNOSIS — D630 Anemia in neoplastic disease: Secondary | ICD-10-CM | POA: Diagnosis present

## 2019-12-27 DIAGNOSIS — Z95828 Presence of other vascular implants and grafts: Secondary | ICD-10-CM

## 2019-12-27 DIAGNOSIS — N179 Acute kidney failure, unspecified: Secondary | ICD-10-CM | POA: Diagnosis not present

## 2019-12-27 DIAGNOSIS — E1151 Type 2 diabetes mellitus with diabetic peripheral angiopathy without gangrene: Secondary | ICD-10-CM | POA: Diagnosis present

## 2019-12-27 DIAGNOSIS — Z66 Do not resuscitate: Secondary | ICD-10-CM | POA: Diagnosis present

## 2019-12-27 DIAGNOSIS — Z8249 Family history of ischemic heart disease and other diseases of the circulatory system: Secondary | ICD-10-CM

## 2019-12-27 DIAGNOSIS — I509 Heart failure, unspecified: Secondary | ICD-10-CM

## 2019-12-27 DIAGNOSIS — D649 Anemia, unspecified: Secondary | ICD-10-CM

## 2019-12-27 DIAGNOSIS — R509 Fever, unspecified: Secondary | ICD-10-CM | POA: Diagnosis not present

## 2019-12-27 DIAGNOSIS — K5900 Constipation, unspecified: Secondary | ICD-10-CM | POA: Diagnosis present

## 2019-12-27 DIAGNOSIS — I517 Cardiomegaly: Secondary | ICD-10-CM | POA: Diagnosis not present

## 2019-12-27 DIAGNOSIS — G620 Drug-induced polyneuropathy: Secondary | ICD-10-CM | POA: Diagnosis present

## 2019-12-27 LAB — CBC WITH DIFFERENTIAL/PLATELET
Abs Immature Granulocytes: 0.1 10*3/uL — ABNORMAL HIGH (ref 0.00–0.07)
Basophils Absolute: 0.1 10*3/uL (ref 0.0–0.1)
Basophils Relative: 0 %
Eosinophils Absolute: 0.1 10*3/uL (ref 0.0–0.5)
Eosinophils Relative: 1 %
HCT: 33.8 % — ABNORMAL LOW (ref 36.0–46.0)
Hemoglobin: 10.4 g/dL — ABNORMAL LOW (ref 12.0–15.0)
Immature Granulocytes: 1 %
Lymphocytes Relative: 6 %
Lymphs Abs: 0.8 10*3/uL (ref 0.7–4.0)
MCH: 30.6 pg (ref 26.0–34.0)
MCHC: 30.8 g/dL (ref 30.0–36.0)
MCV: 99.4 fL (ref 80.0–100.0)
Monocytes Absolute: 0.5 10*3/uL (ref 0.1–1.0)
Monocytes Relative: 4 %
Neutro Abs: 11.8 10*3/uL — ABNORMAL HIGH (ref 1.7–7.7)
Neutrophils Relative %: 88 %
Platelets: 259 10*3/uL (ref 150–400)
RBC: 3.4 MIL/uL — ABNORMAL LOW (ref 3.87–5.11)
RDW: 18.6 % — ABNORMAL HIGH (ref 11.5–15.5)
WBC: 13.4 10*3/uL — ABNORMAL HIGH (ref 4.0–10.5)
nRBC: 0 % (ref 0.0–0.2)

## 2019-12-27 LAB — CMP (CANCER CENTER ONLY)
ALT: 8 U/L (ref 0–44)
AST: 25 U/L (ref 15–41)
Albumin: 3 g/dL — ABNORMAL LOW (ref 3.5–5.0)
Alkaline Phosphatase: 54 U/L (ref 38–126)
Anion gap: 6 (ref 5–15)
BUN: 32 mg/dL — ABNORMAL HIGH (ref 8–23)
CO2: 27 mmol/L (ref 22–32)
Calcium: 8.6 mg/dL — ABNORMAL LOW (ref 8.9–10.3)
Chloride: 112 mmol/L — ABNORMAL HIGH (ref 98–111)
Creatinine: 1.49 mg/dL — ABNORMAL HIGH (ref 0.44–1.00)
GFR, Estimated: 33 mL/min — ABNORMAL LOW (ref >60)
Glucose, Bld: 86 mg/dL (ref 70–99)
Potassium: 3.8 mmol/L (ref 3.5–5.1)
Sodium: 145 mmol/L (ref 135–145)
Total Bilirubin: 0.6 mg/dL (ref 0.3–1.2)
Total Protein: 6.9 g/dL (ref 6.5–8.1)

## 2019-12-27 LAB — CBC WITH DIFFERENTIAL (CANCER CENTER ONLY)
Abs Immature Granulocytes: 0.02 10*3/uL (ref 0.00–0.07)
Basophils Absolute: 0.1 10*3/uL (ref 0.0–0.1)
Basophils Relative: 1 %
Eosinophils Absolute: 0.2 10*3/uL (ref 0.0–0.5)
Eosinophils Relative: 3 %
HCT: 28.6 % — ABNORMAL LOW (ref 36.0–46.0)
Hemoglobin: 9.2 g/dL — ABNORMAL LOW (ref 12.0–15.0)
Immature Granulocytes: 0 %
Lymphocytes Relative: 18 %
Lymphs Abs: 1.2 10*3/uL (ref 0.7–4.0)
MCH: 31.1 pg (ref 26.0–34.0)
MCHC: 32.2 g/dL (ref 30.0–36.0)
MCV: 96.6 fL (ref 80.0–100.0)
Monocytes Absolute: 0.7 10*3/uL (ref 0.1–1.0)
Monocytes Relative: 10 %
Neutro Abs: 4.7 10*3/uL (ref 1.7–7.7)
Neutrophils Relative %: 68 %
Platelet Count: 218 10*3/uL (ref 150–400)
RBC: 2.96 MIL/uL — ABNORMAL LOW (ref 3.87–5.11)
RDW: 18.3 % — ABNORMAL HIGH (ref 11.5–15.5)
WBC Count: 6.8 10*3/uL (ref 4.0–10.5)
nRBC: 0 % (ref 0.0–0.2)

## 2019-12-27 LAB — BASIC METABOLIC PANEL
Anion gap: 11 (ref 5–15)
BUN: 35 mg/dL — ABNORMAL HIGH (ref 8–23)
CO2: 22 mmol/L (ref 22–32)
Calcium: 8.7 mg/dL — ABNORMAL LOW (ref 8.9–10.3)
Chloride: 109 mmol/L (ref 98–111)
Creatinine, Ser: 1.69 mg/dL — ABNORMAL HIGH (ref 0.44–1.00)
GFR, Estimated: 28 mL/min — ABNORMAL LOW (ref >60)
Glucose, Bld: 143 mg/dL — ABNORMAL HIGH (ref 70–99)
Potassium: 4 mmol/L (ref 3.5–5.1)
Sodium: 142 mmol/L (ref 135–145)

## 2019-12-27 LAB — RESPIRATORY PANEL BY RT PCR (FLU A&B, COVID)
Influenza A by PCR: NEGATIVE
Influenza B by PCR: NEGATIVE
SARS Coronavirus 2 by RT PCR: NEGATIVE

## 2019-12-27 LAB — MAGNESIUM: Magnesium: 2.3 mg/dL (ref 1.7–2.4)

## 2019-12-27 LAB — SAMPLE TO BLOOD BANK

## 2019-12-27 LAB — BRAIN NATRIURETIC PEPTIDE: B Natriuretic Peptide: 2301.1 pg/mL — ABNORMAL HIGH (ref 0.0–100.0)

## 2019-12-27 MED ORDER — FUROSEMIDE 10 MG/ML IJ SOLN
40.0000 mg | Freq: Every day | INTRAMUSCULAR | Status: DC
  Administered 2019-12-28 – 2019-12-31 (×4): 40 mg via INTRAVENOUS
  Filled 2019-12-27 (×4): qty 4

## 2019-12-27 MED ORDER — FUROSEMIDE 10 MG/ML IJ SOLN
80.0000 mg | Freq: Once | INTRAMUSCULAR | Status: AC
  Administered 2019-12-27: 80 mg via INTRAVENOUS
  Filled 2019-12-27: qty 8

## 2019-12-27 MED ORDER — SODIUM CHLORIDE 0.9 % IV SOLN: 250.0000 mL | INTRAVENOUS | Status: DC | PRN

## 2019-12-27 MED ORDER — HYDROCHLOROTHIAZIDE 12.5 MG PO CAPS
12.5000 mg | ORAL_CAPSULE | Freq: Every day | ORAL | Status: DC
  Administered 2019-12-28 – 2019-12-31 (×4): 12.5 mg via ORAL
  Filled 2019-12-27 (×4): qty 1

## 2019-12-27 MED ORDER — AMLODIPINE BESYLATE 10 MG PO TABS
10.0000 mg | ORAL_TABLET | Freq: Every day | ORAL | Status: DC
  Administered 2019-12-28 – 2020-01-05 (×9): 10 mg via ORAL
  Filled 2019-12-27 (×5): qty 1
  Filled 2019-12-27: qty 2
  Filled 2019-12-27 (×3): qty 1

## 2019-12-27 MED ORDER — METOPROLOL SUCCINATE ER 100 MG PO TB24
100.0000 mg | ORAL_TABLET | Freq: Every day | ORAL | Status: DC
  Administered 2019-12-28 – 2020-01-05 (×9): 100 mg via ORAL
  Filled 2019-12-27 (×9): qty 1

## 2019-12-27 MED ORDER — ACETAMINOPHEN 325 MG PO TABS
650.0000 mg | ORAL_TABLET | ORAL | Status: DC | PRN
  Administered 2019-12-29 – 2020-01-03 (×6): 650 mg via ORAL
  Filled 2019-12-27 (×6): qty 2

## 2019-12-27 MED ORDER — SODIUM CHLORIDE 0.9% FLUSH
3.0000 mL | Freq: Two times a day (BID) | INTRAVENOUS | Status: DC
  Administered 2019-12-27 – 2020-01-04 (×16): 3 mL via INTRAVENOUS

## 2019-12-27 MED ORDER — FUROSEMIDE 10 MG/ML IJ SOLN: 40.0000 mg | Freq: Two times a day (BID) | INTRAMUSCULAR | Status: DC

## 2019-12-27 MED ORDER — SODIUM CHLORIDE 0.9% FLUSH
10.0000 mL | INTRAVENOUS | Status: DC | PRN
  Administered 2019-12-27: 10 mL
  Filled 2019-12-27: qty 10

## 2019-12-27 MED ORDER — HEPARIN SOD (PORK) LOCK FLUSH 100 UNIT/ML IV SOLN
500.0000 [IU] | Freq: Once | INTRAVENOUS | Status: AC | PRN
  Administered 2019-12-27: 500 [IU]
  Filled 2019-12-27: qty 5

## 2019-12-27 MED ORDER — ENOXAPARIN SODIUM 30 MG/0.3ML ~~LOC~~ SOLN
30.0000 mg | SUBCUTANEOUS | Status: DC
  Administered 2019-12-28 – 2020-01-04 (×8): 30 mg via SUBCUTANEOUS
  Filled 2019-12-27 (×8): qty 0.3

## 2019-12-27 MED ORDER — PROCHLORPERAZINE MALEATE 10 MG PO TABS
ORAL_TABLET | ORAL | Status: AC
  Filled 2019-12-27: qty 1

## 2019-12-27 MED ORDER — IRBESARTAN 150 MG PO TABS
300.0000 mg | ORAL_TABLET | Freq: Every day | ORAL | Status: DC
  Administered 2019-12-28 – 2020-01-05 (×9): 300 mg via ORAL
  Filled 2019-12-27: qty 1
  Filled 2019-12-27 (×8): qty 2

## 2019-12-27 MED ORDER — INSULIN ASPART 100 UNIT/ML ~~LOC~~ SOLN
0.0000 [IU] | Freq: Three times a day (TID) | SUBCUTANEOUS | Status: DC
  Administered 2020-01-01 – 2020-01-02 (×2): 2 [IU] via SUBCUTANEOUS
  Administered 2020-01-04: 3 [IU] via SUBCUTANEOUS

## 2019-12-27 MED ORDER — HYDRALAZINE HCL 20 MG/ML IJ SOLN
10.0000 mg | INTRAMUSCULAR | Status: DC | PRN
  Administered 2019-12-28: 10 mg via INTRAVENOUS
  Filled 2019-12-27: qty 1

## 2019-12-27 MED ORDER — ONDANSETRON HCL 4 MG/2ML IJ SOLN: 4.0000 mg | Freq: Four times a day (QID) | INTRAMUSCULAR | Status: DC | PRN

## 2019-12-27 MED ORDER — SODIUM CHLORIDE 0.9 % IV SOLN
600.0000 mg/m2 | Freq: Once | INTRAVENOUS | Status: AC
  Administered 2019-12-27: 988 mg via INTRAVENOUS
  Filled 2019-12-27: qty 25.98

## 2019-12-27 MED ORDER — SODIUM CHLORIDE 0.9 % IV SOLN
Freq: Once | INTRAVENOUS | Status: AC
  Filled 2019-12-27: qty 250

## 2019-12-27 MED ORDER — PROCHLORPERAZINE MALEATE 10 MG PO TABS
10.0000 mg | ORAL_TABLET | Freq: Once | ORAL | Status: AC
  Administered 2019-12-27: 10 mg via ORAL

## 2019-12-27 MED ORDER — SODIUM CHLORIDE 0.9% FLUSH: 3.0000 mL | INTRAVENOUS | Status: DC | PRN

## 2019-12-27 NOTE — H&P (Signed)
History and Physical    Donna May XAJ:[id number] DOB: 1940/08/24 DOA: 12/27/2019  PCP: [name], MD  Patient coming from: Home  I have personally briefly reviewed patient's old medical records in [place]  Chief Complaint: SOB  HPI: Donna May is a 79 y.o. female with medical history significant of HFpEF (grade 2 DD), DM2, cholangiocarcinoma, prior breast CA.  Pt had chemo for cholangiocarcinoma earlier today.    Pt presents to the ED with SOB.  She was admitted in Aug for HTN urgency and pulm edema.  2d echo in Aug showed grade 2 DD.  Pt had onset of SOB this afternoon, progressively worsened since onset.  Cough productive of clear sputum.  No fevers, chills, CP, vomiting.  Does have increased leg edema.  R arm is always edematous due to prior mastectomy for breast CA in the 90s.   ED Course: Initially on NRB, this is weaned down significantly to 4L after BP comes down from 094 to 709 systolic after being given 1 SL NTG and 80mg  lasix.  CXR shows pulm edema.  COVID neg  BNP 2301   Review of Systems: As per HPI, otherwise all review of systems negative.  Past Medical History:  Diagnosis Date  . Anemia   . Anxiety   . Arthritis   . Breast cancer ([place]) 1992  . Cataract    Bilateral eyes - surgery to remove  . Chronic kidney disease    CKD stage 3  . Complication of anesthesia    "something they use make me itch for a couple of days."  . Depression   . Diabetes mellitus    type 2  . Duodenitis 01/18/2002  . Fainting spell   . Family history of breast cancer   . Family history of prostate cancer   . GERD (gastroesophageal reflux disease)   . Heart murmur    never has caused any problems  . Hiatal hernia 08/08/2008, 01/18/2002  . History of pneumonia    x 2  . Hyperlipidemia   . Hypertension   . Liver cancer ([place]) 08/2018  . Lymphedema 2017   Right arm  . Pneumonia    x 2  . UTI (lower urinary tract infection)     Past Surgical  History:  Procedure Laterality Date  . AXILLARY SURGERY     cyst removal, right  . COLONOSCOPY  09/23/2018   Dr. Havery Moros - polyps  . EYE SURGERY Bilateral    cataracts to remove  . LAPAROSCOPY N/A 11/17/2018   Procedure: LAPAROSCOPY DIAGNOSTIC, INTRAOPERATIVE ULTRASOUND, PERITONEAL BIOPSIES;  Surgeon: [name], MD;  Location: [facility];  Service: General;  Laterality: N/A;  GENERAL AND EPIDURAL  . LIVER BIOPSY  08/2018   Dr. Lindwood Coke  . MASTECTOMY  1992   right, with flap  . NM MYOCAR PERF WALL MOTION  06/11/2009   Protocol:[place], post stress EF58%, EKG negative for ischemia, low risk  . PORTACATH PLACEMENT N/A 12/02/2018   Procedure: INSERTION PORT-A-CATH;  Surgeon: [name], MD;  Location: [facility];  Service: General;  Laterality: N/A;  . RECONSTRUCTION BREAST W/ TRAM FLAP Right   . TONSILLECTOMY    . TRANSTHORACIC ECHOCARDIOGRAM  12/24/2009   LVEF =>55%, normal study  . UPPER GI ENDOSCOPY       reports that she quit smoking about 31 years ago. Her smoking use included cigarettes. She has a 6.25 pack-year smoking history. She has never used smokeless tobacco. She reports previous alcohol use  of about 1.0 standard drink of alcohol per week. She reports that she does not use drugs.  No Known Allergies  Family History  Problem Relation Age of Onset  . Breast cancer Cousin        diagnosed >50; mother's first cousins  . Diabetes Brother   . Heart disease Mother   . Hypertension Mother   . Heart disease Father   . Hypertension Father   . Prostate cancer Brother 20  . Heart attack Maternal Grandmother   . Stroke Maternal Grandfather   . Colon cancer Neg Hx   . Esophageal cancer Neg Hx   . Stomach cancer Neg Hx   . Rectal cancer Neg Hx      Prior to Admission medications   Medication Sig Start Date End Date Taking? Authorizing Provider  amLODipine (NORVASC) 10 MG tablet Take 1 tablet (10 mg total) by mouth daily. 10/22/19   Danford, Suann Larry, MD  aspirin 81 MG  tablet Take 81 mg by mouth daily.     [provider]  Calcium Carbonate-Vitamin D 600-200 MG-UNIT TABS Take 1 tablet by mouth daily.    [provider]  CALCIUM-MAGNESIUM-VITAMIN D PO Take 1 tablet by mouth once a week.     [provider]  furosemide (LASIX) 40 MG tablet Take 1 tablet (40 mg total) by mouth 2 (two) times a week. 11/23/19 11/22/20  [name], Mihai, MD  glucose blood test strip 1 each by Other route 2 (two) times daily. Use Onetouch verio test strips as instructed to check blood sugar twice daily. 01/05/19   Elayne Snare, MD  hydrochlorothiazide (MICROZIDE) 12.5 MG capsule Take 1 capsule (12.5 mg total) by mouth daily. 11/22/19 02/20/20  [name], Mihai, MD  lansoprazole (PREVACID) 15 MG capsule Take 1 capsule (15 mg total) by mouth daily. 06/08/19   [name], [name], MD  lidocaine-prilocaine (EMLA) cream Apply to affected area once Patient taking differently: Apply 1 application topically as needed (for port).  05/31/19   Alla Feeling, NP  loratadine (CLARITIN) 10 MG tablet Take 10 mg by mouth daily as needed for allergies.    [provider]  metoprolol succinate (TOPROL-XL) 100 MG 24 hr tablet Take 1 tablet (100 mg total) by mouth daily. Take with or immediately following a meal. 10/22/19   Danford, Suann Larry, MD  Omega 3 1000 MG CAPS Take 2,000 mg by mouth daily.     [provider]  potassium chloride (KLOR-CON) 10 MEQ tablet Take 1 tablet (10 mEq total) by mouth 2 (two) times a week. 11/23/19   [name], Mihai, MD  sitaGLIPtin (JANUVIA) 100 MG tablet Take 1 tablet (100 mg total) by mouth daily. 01/20/19   Elayne Snare, MD  valsartan (DIOVAN) 320 MG tablet Take 1 tablet (320 mg total) by mouth daily. 10/22/19   Edwin Dada, MD    Physical Exam: Vitals:   12/27/19 2044 12/27/19 2045 12/27/19 2046 12/27/19 2130  BP: (!) 214/126 (!) 195/96  (!) 188/79  Pulse: 75 75  67  Resp:  (!) 29  (!) 34  Temp: 98.4 F (36.9 C)       TempSrc: Oral     SpO2: 100% 100%  100%  Weight:   58.1 kg   Height:   5\' 5"  (1.651 m)     Constitutional: NAD, calm, comfortable Eyes: PERRL, lids and conjunctivae normal ENMT: Mucous membranes are moist. Posterior pharynx clear of any exudate or lesions.Normal dentition.  Neck: normal, supple, no  masses, no thyromegaly Respiratory: clear to auscultation bilaterally, no wheezing, no crackles. Normal respiratory effort. No accessory muscle use.  Cardiovascular: Regular rate and rhythm, no murmurs / rubs / gallops. 2+ BLE edema. 2+ pedal pulses. No carotid bruits.  Abdomen: no tenderness, no masses palpated. No hepatosplenomegaly. Bowel sounds positive.  Musculoskeletal: no clubbing / cyanosis. No joint deformity upper and lower extremities. Good ROM, no contractures. Normal muscle tone.  Skin: no rashes, lesions, ulcers. No induration Neurologic: CN 2-12 grossly intact. Sensation intact, DTR normal. Strength 5/5 in all 4.  Psychiatric: Normal judgment and insight. Alert and oriented x 3. Normal mood.    Labs on Admission: I have personally reviewed following labs and imaging studies  CBC: Recent Labs  Lab 12/27/19 0746 12/27/19 2050  WBC 6.8 13.4*  NEUTROABS 4.7 11.8*  HGB 9.2* 10.4*  HCT 28.6* 33.8*  MCV 96.6 99.4  PLT 218 161   Basic Metabolic Panel: Recent Labs  Lab 12/27/19 0746 12/27/19 2050  NA 145 142  K 3.8 4.0  CL 112* 109  CO2 27 22  GLUCOSE 86 143*  BUN 32* 35*  CREATININE 1.49* 1.69*  CALCIUM 8.6* 8.7*  MG 2.3  --    GFR: Estimated Creatinine Clearance: 23.9 mL/min (A) (by C-G formula based on SCr of 1.69 mg/dL (H)). Liver Function Tests: Recent Labs  Lab 12/27/19 0746  AST 25  ALT 8  ALKPHOS 54  BILITOT 0.6  PROT 6.9  ALBUMIN 3.0*   No results for input(s): LIPASE, AMYLASE in the last 168 hours. No results for input(s): AMMONIA in the last 168 hours. Coagulation Profile: No results for input(s): INR, PROTIME in the last 168  hours. Cardiac Enzymes: No results for input(s): CKTOTAL, CKMB, CKMBINDEX, TROPONINI in the last 168 hours. BNP (last 3 results) No results for input(s): PROBNP in the last 8760 hours. HbA1C: No results for input(s): HGBA1C in the last 72 hours. CBG: No results for input(s): GLUCAP in the last 168 hours. Lipid Profile: No results for input(s): CHOL, HDL, LDLCALC, TRIG, CHOLHDL, LDLDIRECT in the last 72 hours. Thyroid Function Tests: No results for input(s): TSH, T4TOTAL, FREET4, T3FREE, THYROIDAB in the last 72 hours. Anemia Panel: No results for input(s): VITAMINB12, FOLATE, FERRITIN, TIBC, IRON, RETICCTPCT in the last 72 hours. Urine analysis: No results found for: COLORURINE, APPEARANCEUR, LABSPEC, PHURINE, GLUCOSEU, HGBUR, BILIRUBINUR, KETONESUR, PROTEINUR, UROBILINOGEN, NITRITE, LEUKOCYTESUR  Radiological Exams on Admission: DG Chest Port 1 View  Result Date: 12/27/2019 CLINICAL DATA:  79 year old female with shortness of breath. Symptom onset 1800 hours. Extremity swelling. EXAM: PORTABLE CHEST 1 VIEW COMPARISON:  Portable chest 10/20/2019 and earlier. FINDINGS: Portable AP semi upright view at 2107 hours. Stable cardiomegaly and mediastinal contours. Stable left chest power port. Asymmetric patchy and interstitial pulmonary opacity greater in the right lung. Basilar predominance. Appearance is similar but less pronounced than that in 13-Nov-2022. No pneumothorax. No pleural effusion is evident. No acute osseous abnormality identified. Negative visible bowel gas pattern. Chronic right trust wall surgical clips. IMPRESSION: Chronic cardiomegaly with asymmetric lung opacity favored due to pulmonary edema. Viral/atypical respiratory infection is felt less likely. Electronically Signed   By: Genevie Ann M.D.   On: 12/27/2019 21:26    EKG: Independently reviewed.  Assessment/Plan Principal Problem:   Acute on chronic diastolic CHF (congestive heart failure) (HCC) Active Problems:   Diabetes  mellitus with renal complications (HCC)   Essential hypertension   Intrahepatic cholangiocarcinoma (HCC)   Acute respiratory failure with hypoxia ([place])  CKD (chronic kidney disease), stage III (HCC)   Hypertensive urgency    1. Acute on chronic diastolic CHF / HTN urgency - 1. CHF pathway 2. Lasix 80mg  X1 in ED then 40 mg daily 3. Resume all home BP meds including BB, ACEi 4. PRN hydralazine overnight 5. Tele monitor 6. Close monitoring of renal function during diuresis 2. Cholangiocarcinoma - 1. Cholangiocarcinoma actually showed really good response to therapy and on 10/6 Dr. Krista Blue recd stopping chemo for the time being since they dont see any hypermetabolic lesions on PET scan.  Pt worried about progression of cancer so wanted to continue chemo 3. HTN - 1. Resume all home BP meds 2. Prn hydralazine 4. CKD stage 3 - 1. Chronic and baseline 2. Monitor closely with diuresis 5. DM2 - 1. Cont home meds (looks like just Januvia) when med rec completed  DVT prophylaxis: Lovenox Code Status: Patient wants her code status changed back to full code at this time she says. Family Communication: No family in room Disposition Plan: Home after breathing improved Consults called: None Admission status: Place in 29     Davan Nawabi, [facility]  How to contact the [facility] Attending or Consulting provider [place] or covering provider during after hours [place], for this patient?  1. Check the care team in [facility] and look for a) attending/consulting [facility] provider listed and b) the [facility] team listed 2. Log into [email or web address]  Amion Physician Scheduling and messaging for groups and whole hospitals  On call and physician scheduling software for group practices, residents, hospitalists and other medical providers for call, clinic, rotation and shift schedules. On[employer] is a hospital-wide system for scheduling doctors and paging doctors on call. EasyPlot is for scientific plotting  and data analysis.  [email or web address]  and use [place]'s universal password to access. If you do not have the password, please contact the hospital operator.  3. Locate the [facility] provider you are looking for under [facility] and page to a number that you can be directly reached. 4. If you still have difficulty reaching the provider, please page the [facility] (Director on Call) for the Hospitalists listed on amion for assistance.  12/27/2019, 11:43 PM

## 2019-12-27 NOTE — ED Provider Notes (Signed)
[facility] EMERGENCY DEPARTMENT Provider Note   CSN: [id number] Arrival date & time: 12/27/19  2039     History Chief Complaint  Patient presents with  . Shortness of Breath    Donna May is a 79 y.o. female.  Patient is a 79 year old female who presents with shortness of breath.  She has intrahepatic cholangiocarcinoma and has previously been on chemotherapy.  She stopped the chemotherapy earlier this month because there was some thought that it had reviewed all the benefits that was possible.  She did have some metastatic disease.  She also was not tolerating the chemo well and it was felt that per oncology notes that they would take a break from the chemo unless they noticed progression of the disease.  She also has a history of anemia, hypertension, diabetes, peripheral vascular disease, chronic kidney disease.  She was admitted in August for multifocal pneumonia and fluid overload.  She had an echocardiogram at that time which showed grade 2 diastolic dysfunction with a speckled appearance of the myocardium.  She reports that she had onset of shortness of breath this afternoon that is progressively gotten worse.  She has a cough which is productive of some clear sputum.  No fevers.  She feels like she has a little bit of increased leg swelling.  No associated chest pain.  No vomiting or diarrhea.  Her oxygen saturations were 79% on room air on EMS arrival.  She was placed on a nonrebreather mask.  She normally is not on home oxygen.        Past Medical History:  Diagnosis Date  . Anemia   . Anxiety   . Arthritis   . Breast cancer ([place]) 1992  . Cataract    Bilateral eyes - surgery to remove  . Chronic kidney disease    CKD stage 3  . Complication of anesthesia    "something they use make me itch for a couple of days."  . Depression   . Diabetes mellitus    type 2  . Duodenitis 01/18/2002  . Fainting spell   . Family history of breast cancer   .  Family history of prostate cancer   . GERD (gastroesophageal reflux disease)   . Heart murmur    never has caused any problems  . Hiatal hernia 08/08/2008, 01/18/2002  . History of pneumonia    x 2  . Hyperlipidemia   . Hypertension   . Liver cancer ([place]) 08/2018  . Lymphedema 2017   Right arm  . Pneumonia    x 2  . UTI (lower urinary tract infection)     Patient Active Problem List   Diagnosis Date Noted  . Hypertensive urgency 12/27/2019  . Acute on chronic diastolic CHF (congestive heart failure) ([place]) 12/27/2019  . Palliative care by specialist   . DNR (do not resuscitate) discussion   . Acute respiratory failure with hypoxia ([place]) 10/18/2019  . Hypertensive emergency 10/18/2019  . Anemia 10/18/2019  . CKD (chronic kidney disease), stage III ([place]) 10/18/2019  . Chemotherapy-induced peripheral neuropathy ([place]) 08/28/2019  . Port-A-Cath in place 12/14/2018  . Goals of care, counseling/discussion 11/24/2018  . Status post laparoscopy 11/17/2018  . Genetic testing 11/07/2018  . Family history of prostate cancer   . Family history of breast cancer   . Intrahepatic cholangiocarcinoma ([place]) 09/20/2018  . Coronary artery calcification 04/20/2016  . Ductal carcinoma in situ (DCIS) of right breast 11/26/2015  . Lymphedema of arm 11/26/2015  .  Lymphedema 11/01/2015  . Leg pain, bilateral 12/28/2013  . Special screening for malignant neoplasms, colon 08/09/2013  . Renal artery stenosis, native, bilateral ([place]) 06/09/2013  . Diabetes mellitus with renal complications ([place]) 09/06/7626  . RASH AND OTHER NONSPECIFIC SKIN ERUPTION 07/05/2008  . Malignant neoplasm of female breast ([place]) 07/04/2008  . HYPERCHOLESTEROLEMIA 07/04/2008  . Essential hypertension 07/04/2008  . GERD 07/04/2008  . DUODENITIS, WITHOUT HEMORRHAGE 07/04/2008  . HIATAL HERNIA 07/04/2008  . Arthropathy 07/04/2008    Past Surgical History:  Procedure Laterality Date  . AXILLARY SURGERY     cyst removal,  right  . COLONOSCOPY  09/23/2018   Dr. Havery Moros - polyps  . EYE SURGERY Bilateral    cataracts to remove  . LAPAROSCOPY N/A 11/17/2018   Procedure: LAPAROSCOPY DIAGNOSTIC, INTRAOPERATIVE ULTRASOUND, PERITONEAL BIOPSIES;  Surgeon: [name], MD;  Location: [place];  Service: General;  Laterality: N/A;  GENERAL AND EPIDURAL  . LIVER BIOPSY  08/2018   Dr. Lindwood Coke  . MASTECTOMY  1992   right, with flap  . NM MYOCAR PERF WALL MOTION  06/11/2009   Protocol:[place], post stress EF58%, EKG negative for ischemia, low risk  . PORTACATH PLACEMENT N/A 12/02/2018   Procedure: INSERTION PORT-A-CATH;  Surgeon: [name], MD;  Location: [place];  Service: General;  Laterality: N/A;  . RECONSTRUCTION BREAST W/ TRAM FLAP Right   . TONSILLECTOMY    . TRANSTHORACIC ECHOCARDIOGRAM  12/24/2009   LVEF =>55%, normal study  . UPPER GI ENDOSCOPY       OB History    Gravida      Para      Term      Preterm      AB      Living  2     SAB      TAB      Ectopic      Multiple      Live Births              Family History  Problem Relation Age of Onset  . Breast cancer Cousin        diagnosed >50; mother's first cousins  . Diabetes Brother   . Heart disease Mother   . Hypertension Mother   . Heart disease Father   . Hypertension Father   . Prostate cancer Brother 71  . Heart attack Maternal Grandmother   . Stroke Maternal Grandfather   . Colon cancer Neg Hx   . Esophageal cancer Neg Hx   . Stomach cancer Neg Hx   . Rectal cancer Neg Hx     Social History   Tobacco Use  . Smoking status: Former Smoker    Packs/day: 0.25    Years: 25.00    Pack years: 6.25    Types: Cigarettes    Quit date: 1990    Years since quitting: 31.8  . Smokeless tobacco: Never Used  Vaping Use  . Vaping Use: Never used  Substance Use Topics  . Alcohol use: Not Currently    Alcohol/week: 1.0 standard drink    Types: 1 Glasses of wine per week    Comment: occasional wine  . Drug use: No     Home Medications Prior to Admission medications   Medication Sig Start Date End Date Taking? Authorizing Provider  amLODipine (NORVASC) 10 MG tablet Take 1 tablet (10 mg total) by mouth daily. 10/22/19   Danford, Suann Larry, MD  aspirin 81 MG tablet Take 81 mg by mouth daily.  [provider]  Calcium Carbonate-Vitamin D 600-200 MG-UNIT TABS Take 1 tablet by mouth daily.    [provider]  CALCIUM-MAGNESIUM-VITAMIN D PO Take 1 tablet by mouth once a week.     [provider]  furosemide (LASIX) 40 MG tablet Take 1 tablet (40 mg total) by mouth 2 (two) times a week. 11/23/19 11/22/20  [name], Mihai, MD  glucose blood test strip 1 each by Other route 2 (two) times daily. Use Onetouch verio test strips as instructed to check blood sugar twice daily. 01/05/19   Elayne Snare, MD  hydrochlorothiazide (MICROZIDE) 12.5 MG capsule Take 1 capsule (12.5 mg total) by mouth daily. 11/22/19 02/20/20  [name], Mihai, MD  lansoprazole (PREVACID) 15 MG capsule Take 1 capsule (15 mg total) by mouth daily. 06/08/19   [name], [name], MD  lidocaine-prilocaine (EMLA) cream Apply to affected area once Patient taking differently: Apply 1 application topically as needed (for port).  05/31/19   Alla Feeling, NP  loratadine (CLARITIN) 10 MG tablet Take 10 mg by mouth daily as needed for allergies.    [provider]  metoprolol succinate (TOPROL-XL) 100 MG 24 hr tablet Take 1 tablet (100 mg total) by mouth daily. Take with or immediately following a meal. 10/22/19   Danford, Suann Larry, MD  Omega 3 1000 MG CAPS Take 2,000 mg by mouth daily.     [provider]  potassium chloride (KLOR-CON) 10 MEQ tablet Take 1 tablet (10 mEq total) by mouth 2 (two) times a week. 11/23/19   [name], Mihai, MD  sitaGLIPtin (JANUVIA) 100 MG tablet Take 1 tablet (100 mg total) by mouth daily. 01/20/19   Elayne Snare, MD  valsartan (DIOVAN) 320 MG tablet Take 1 tablet (320 mg total) by  mouth daily. 10/22/19   Danford, Suann Larry, MD    Allergies    Patient has no known allergies.  Review of Systems   Review of Systems  Constitutional: Positive for fatigue. Negative for chills, diaphoresis and fever.  HENT: Negative for congestion, rhinorrhea and sneezing.   Eyes: Negative.   Respiratory: Positive for cough and shortness of breath. Negative for chest tightness.   Cardiovascular: Positive for leg swelling. Negative for chest pain.  Gastrointestinal: Negative for abdominal pain, blood in stool, diarrhea, nausea and vomiting.  Genitourinary: Negative for difficulty urinating, flank pain, frequency and hematuria.  Musculoskeletal: Negative for arthralgias and back pain.  Skin: Negative for rash.  Neurological: Negative for dizziness, speech difficulty, weakness, numbness and headaches.    Physical Exam Updated Vital Signs BP (!) 163/66   Pulse (!) 58   Temp 98.4 F (36.9 C) (Oral)   Resp 15   Ht 5\' 5"  (1.651 m)   Wt 58.1 kg   LMP  (LMP Unknown)   SpO2 100%   BMI 21.30 kg/m   Physical Exam Constitutional:      Appearance: She is well-developed.  HENT:     Head: Normocephalic and atraumatic.  Eyes:     Pupils: Pupils are equal, round, and reactive to light.  Cardiovascular:     Rate and Rhythm: Normal rate and regular rhythm.     Heart sounds: Normal heart sounds.  Pulmonary:     Effort: Tachypnea and accessory muscle usage present. No respiratory distress.     Breath sounds: Rales present. No wheezing.  Chest:     Chest wall: No tenderness.  Abdominal:     General: Bowel sounds are normal.     Palpations: Abdomen  is soft.     Tenderness: There is no abdominal tenderness. There is no guarding or rebound.  Musculoskeletal:        General: Normal range of motion.     Cervical back: Normal range of motion and neck supple.     Comments: 1+ pain edema to the bilateral lower extremities  Lymphadenopathy:     Cervical: No cervical adenopathy.  Skin:     General: Skin is warm and dry.     Findings: No rash.  Neurological:     Mental Status: She is alert and oriented to person, place, and time.     ED Results / Procedures / Treatments   Labs (all labs ordered are listed, but only abnormal results are displayed) Labs Reviewed  BASIC METABOLIC PANEL - Abnormal; Notable for the following components:      Result Value   Glucose, Bld 143 (*)    BUN 35 (*)    Creatinine, Ser 1.69 (*)    Calcium 8.7 (*)    GFR, Estimated 28 (*)    All other components within normal limits  CBC WITH DIFFERENTIAL/PLATELET - Abnormal; Notable for the following components:   WBC 13.4 (*)    RBC 3.40 (*)    Hemoglobin 10.4 (*)    HCT 33.8 (*)    RDW 18.6 (*)    Neutro Abs 11.8 (*)    Abs Immature Granulocytes 0.10 (*)    All other components within normal limits  BRAIN NATRIURETIC PEPTIDE - Abnormal; Notable for the following components:   B Natriuretic Peptide 2,301.1 (*)    All other components within normal limits  RESPIRATORY PANEL BY RT PCR (FLU A&B, COVID)  BASIC METABOLIC PANEL  HEMOGLOBIN A1C    EKG None  Radiology DG Chest Port 1 View  Result Date: 12/27/2019 CLINICAL DATA:  79 year old female with shortness of breath. Symptom onset 1800 hours. Extremity swelling. EXAM: PORTABLE CHEST 1 VIEW COMPARISON:  Portable chest 10/20/2019 and earlier. FINDINGS: Portable AP semi upright view at 2107 hours. Stable cardiomegaly and mediastinal contours. Stable left chest power port. Asymmetric patchy and interstitial pulmonary opacity greater in the right lung. Basilar predominance. Appearance is similar but less pronounced than that in 2022-12-01. No pneumothorax. No pleural effusion is evident. No acute osseous abnormality identified. Negative visible bowel gas pattern. Chronic right trust wall surgical clips. IMPRESSION: Chronic cardiomegaly with asymmetric lung opacity favored due to pulmonary edema. Viral/atypical respiratory infection is felt less  likely. Electronically Signed   By: Genevie Ann M.D.   On: 12/27/2019 21:26    Procedures Procedures (including critical care time)  Medications Ordered in ED Medications  amLODipine (NORVASC) tablet 10 mg (has no administration in time range)  hydrochlorothiazide (MICROZIDE) capsule 12.5 mg (has no administration in time range)  metoprolol succinate (TOPROL-XL) 24 hr tablet 100 mg (has no administration in time range)  irbesartan (AVAPRO) tablet 300 mg (has no administration in time range)  hydrALAZINE (APRESOLINE) injection 10-20 mg (has no administration in time range)  sodium chloride flush (NS) 0.9 % injection 3 mL (3 mLs Intravenous Given 12/27/19 2319)  sodium chloride flush (NS) 0.9 % injection 3 mL (has no administration in time range)  0.9 %  sodium chloride infusion (has no administration in time range)  acetaminophen (TYLENOL) tablet 650 mg (has no administration in time range)  ondansetron (ZOFRAN) injection 4 mg (has no administration in time range)  enoxaparin (LOVENOX) injection 30 mg (has no administration in time range)  furosemide (  LASIX) injection 40 mg (has no administration in time range)  insulin aspart (novoLOG) injection 0-15 Units (has no administration in time range)  furosemide (LASIX) injection 80 mg (80 mg Intravenous Given 12/27/19 2235)    ED Course  I have reviewed the triage vital signs and the nursing notes.  Pertinent labs & imaging results that were available during my care of the patient were reviewed by me and considered in my medical decision making (see chart for details).    MDM Rules/Calculators/A&P                          Patient is a 79 year old female who presents with shortness of breath.  She has associated hypoxia.  She is maintaining good oxygen saturations on nasal cannula at 4 L.  Chest x-ray shows evidence of some pulmonary edema.  Her BNP is markedly elevated.  Her WBC count is also elevated but she is afebrile.  I suspect this is  more likely pulmonary edema versus pneumonia.  She was given a dose of Lasix.  She has no ischemic changes on EKG.  No associated chest pain.  I spoke with Dr. Alcario Drought he will admit the patient for further treatment.  CRITICAL CARE Performed by: Malvin Johns Total critical care time: 60 minutes Critical care time was exclusive of separately billable procedures and treating other patients. Critical care was necessary to treat or prevent imminent or life-threatening deterioration. Critical care was time spent personally by me on the following activities: development of treatment plan with patient and/or surrogate as well as nursing, discussions with consultants, evaluation of patient's response to treatment, examination of patient, obtaining history from patient or surrogate, ordering and performing treatments and interventions, ordering and review of laboratory studies, ordering and review of radiographic studies, pulse oximetry and re-evaluation of patient's condition.  Final Clinical Impression(s) / ED Diagnoses Final diagnoses:  SOB (shortness of breath)  Acute on chronic congestive heart failure, unspecified heart failure type [facility])    Rx / DC Orders ED Discharge Orders    None       Malvin Johns, MD 12/28/19 0023

## 2019-12-27 NOTE — ED Triage Notes (Signed)
Pt BIB [facility], with c/o SOB r/t CHF excerebration , that started at 1800 today. Pt speakuing in full sentence on EMS arrival , crackles/rale heard, swelling noted in hands, RA sating 79, NRB 100 , normally requires no O2, pt hypertensive at 240/110, hx unknown, 12 lead NS, with PVC, EMS have 1 nitro , pressure came down 190/100, with lung sound improvement.   Pt had pneumonia 10/2019, hx liver cancer

## 2019-12-27 NOTE — Patient Instructions (Signed)
[facility] °Discharge Instructions for Patients Receiving Chemotherapy ° °Today you received the following chemotherapy agents Gemzar ° °To help prevent nausea and vomiting after your treatment, we encourage you to take your nausea medication as directed. °  °If you develop nausea and vomiting that is not controlled by your nausea medication, call the clinic.  ° °BELOW ARE SYMPTOMS THAT SHOULD BE REPORTED IMMEDIATELY: °· *FEVER GREATER THAN 100.5 F °· *CHILLS WITH OR WITHOUT FEVER °· NAUSEA AND VOMITING THAT IS NOT CONTROLLED WITH YOUR NAUSEA MEDICATION °· *UNUSUAL SHORTNESS OF BREATH °· *UNUSUAL BRUISING OR BLEEDING °· TENDERNESS IN MOUTH AND THROAT WITH OR WITHOUT PRESENCE OF ULCERS °· *URINARY PROBLEMS °· *BOWEL PROBLEMS °· UNUSUAL RASH °Items with * indicate a potential emergency and should be followed up as soon as possible. ° °Feel free to call the clinic should you have any questions or concerns. The clinic phone number is [phone]. ° °Please show the CHEMO ALERT CARD at check-in to the Emergency Department and triage nurse. ° ° °

## 2019-12-27 NOTE — Progress Notes (Signed)
Per MD Burr Medico, okay to treat with BP today. Recheck BP in 1 hour per MD Burr Medico.

## 2019-12-28 ENCOUNTER — Ambulatory Visit: Payer: Medicare Other | Admitting: Physician Assistant

## 2019-12-28 ENCOUNTER — Encounter (HOSPITAL_COMMUNITY): Payer: Self-pay | Admitting: Internal Medicine

## 2019-12-28 DIAGNOSIS — J811 Chronic pulmonary edema: Secondary | ICD-10-CM | POA: Diagnosis not present

## 2019-12-28 DIAGNOSIS — R509 Fever, unspecified: Secondary | ICD-10-CM | POA: Diagnosis not present

## 2019-12-28 DIAGNOSIS — I700 Atherosclerosis of aorta: Secondary | ICD-10-CM | POA: Diagnosis not present

## 2019-12-28 DIAGNOSIS — J90 Pleural effusion, not elsewhere classified: Secondary | ICD-10-CM | POA: Diagnosis not present

## 2019-12-28 DIAGNOSIS — N183 Chronic kidney disease, stage 3 unspecified: Secondary | ICD-10-CM | POA: Diagnosis not present

## 2019-12-28 DIAGNOSIS — D6481 Anemia due to antineoplastic chemotherapy: Secondary | ICD-10-CM | POA: Diagnosis not present

## 2019-12-28 DIAGNOSIS — K5900 Constipation, unspecified: Secondary | ICD-10-CM | POA: Diagnosis present

## 2019-12-28 DIAGNOSIS — R059 Cough, unspecified: Secondary | ICD-10-CM | POA: Diagnosis not present

## 2019-12-28 DIAGNOSIS — N179 Acute kidney failure, unspecified: Secondary | ICD-10-CM | POA: Diagnosis not present

## 2019-12-28 DIAGNOSIS — G620 Drug-induced polyneuropathy: Secondary | ICD-10-CM | POA: Diagnosis present

## 2019-12-28 DIAGNOSIS — Z8701 Personal history of pneumonia (recurrent): Secondary | ICD-10-CM | POA: Diagnosis not present

## 2019-12-28 DIAGNOSIS — Z8505 Personal history of malignant neoplasm of liver: Secondary | ICD-10-CM | POA: Diagnosis not present

## 2019-12-28 DIAGNOSIS — T451X5A Adverse effect of antineoplastic and immunosuppressive drugs, initial encounter: Secondary | ICD-10-CM | POA: Diagnosis present

## 2019-12-28 DIAGNOSIS — J189 Pneumonia, unspecified organism: Secondary | ICD-10-CM | POA: Diagnosis not present

## 2019-12-28 DIAGNOSIS — I509 Heart failure, unspecified: Secondary | ICD-10-CM | POA: Diagnosis not present

## 2019-12-28 DIAGNOSIS — R5381 Other malaise: Secondary | ICD-10-CM | POA: Diagnosis present

## 2019-12-28 DIAGNOSIS — Z853 Personal history of malignant neoplasm of breast: Secondary | ICD-10-CM | POA: Diagnosis not present

## 2019-12-28 DIAGNOSIS — I10 Essential (primary) hypertension: Secondary | ICD-10-CM | POA: Diagnosis not present

## 2019-12-28 DIAGNOSIS — K7689 Other specified diseases of liver: Secondary | ICD-10-CM | POA: Diagnosis not present

## 2019-12-28 DIAGNOSIS — I160 Hypertensive urgency: Secondary | ICD-10-CM | POA: Diagnosis not present

## 2019-12-28 DIAGNOSIS — Z87891 Personal history of nicotine dependence: Secondary | ICD-10-CM | POA: Diagnosis not present

## 2019-12-28 DIAGNOSIS — Z79899 Other long term (current) drug therapy: Secondary | ICD-10-CM | POA: Diagnosis not present

## 2019-12-28 DIAGNOSIS — N1832 Chronic kidney disease, stage 3b: Secondary | ICD-10-CM | POA: Diagnosis not present

## 2019-12-28 DIAGNOSIS — E1151 Type 2 diabetes mellitus with diabetic peripheral angiopathy without gangrene: Secondary | ICD-10-CM | POA: Diagnosis not present

## 2019-12-28 DIAGNOSIS — Z823 Family history of stroke: Secondary | ICD-10-CM | POA: Diagnosis not present

## 2019-12-28 DIAGNOSIS — E1122 Type 2 diabetes mellitus with diabetic chronic kidney disease: Secondary | ICD-10-CM | POA: Diagnosis not present

## 2019-12-28 DIAGNOSIS — Z20822 Contact with and (suspected) exposure to covid-19: Secondary | ICD-10-CM | POA: Diagnosis not present

## 2019-12-28 DIAGNOSIS — Z95828 Presence of other vascular implants and grafts: Secondary | ICD-10-CM | POA: Diagnosis not present

## 2019-12-28 DIAGNOSIS — Z8042 Family history of malignant neoplasm of prostate: Secondary | ICD-10-CM | POA: Diagnosis not present

## 2019-12-28 DIAGNOSIS — E1129 Type 2 diabetes mellitus with other diabetic kidney complication: Secondary | ICD-10-CM | POA: Diagnosis not present

## 2019-12-28 DIAGNOSIS — J9600 Acute respiratory failure, unspecified whether with hypoxia or hypercapnia: Secondary | ICD-10-CM | POA: Diagnosis present

## 2019-12-28 DIAGNOSIS — R0602 Shortness of breath: Secondary | ICD-10-CM | POA: Diagnosis not present

## 2019-12-28 DIAGNOSIS — C221 Intrahepatic bile duct carcinoma: Secondary | ICD-10-CM | POA: Diagnosis not present

## 2019-12-28 DIAGNOSIS — J9601 Acute respiratory failure with hypoxia: Secondary | ICD-10-CM | POA: Diagnosis not present

## 2019-12-28 DIAGNOSIS — I361 Nonrheumatic tricuspid (valve) insufficiency: Secondary | ICD-10-CM | POA: Diagnosis not present

## 2019-12-28 DIAGNOSIS — D630 Anemia in neoplastic disease: Secondary | ICD-10-CM | POA: Diagnosis not present

## 2019-12-28 DIAGNOSIS — I5033 Acute on chronic diastolic (congestive) heart failure: Secondary | ICD-10-CM | POA: Diagnosis not present

## 2019-12-28 DIAGNOSIS — N184 Chronic kidney disease, stage 4 (severe): Secondary | ICD-10-CM | POA: Diagnosis not present

## 2019-12-28 DIAGNOSIS — I340 Nonrheumatic mitral (valve) insufficiency: Secondary | ICD-10-CM | POA: Diagnosis not present

## 2019-12-28 DIAGNOSIS — I130 Hypertensive heart and chronic kidney disease with heart failure and stage 1 through stage 4 chronic kidney disease, or unspecified chronic kidney disease: Secondary | ICD-10-CM | POA: Diagnosis not present

## 2019-12-28 LAB — BASIC METABOLIC PANEL
Anion gap: 10 (ref 5–15)
BUN: 37 mg/dL — ABNORMAL HIGH (ref 8–23)
CO2: 24 mmol/L (ref 22–32)
Calcium: 8.4 mg/dL — ABNORMAL LOW (ref 8.9–10.3)
Chloride: 108 mmol/L (ref 98–111)
Creatinine, Ser: 1.77 mg/dL — ABNORMAL HIGH (ref 0.44–1.00)
GFR, Estimated: 27 mL/min — ABNORMAL LOW (ref >60)
Glucose, Bld: 119 mg/dL — ABNORMAL HIGH (ref 70–99)
Potassium: 4 mmol/L (ref 3.5–5.1)
Sodium: 142 mmol/L (ref 135–145)

## 2019-12-28 LAB — CBG MONITORING, ED
Glucose-Capillary: 87 mg/dL (ref 70–99)
Glucose-Capillary: 106 mg/dL — ABNORMAL HIGH (ref 70–99)

## 2019-12-28 LAB — TROPONIN I (HIGH SENSITIVITY)
Troponin I (High Sensitivity): 28 ng/L — ABNORMAL HIGH (ref <18)
Troponin I (High Sensitivity): 27 ng/L — ABNORMAL HIGH (ref <18)

## 2019-12-28 LAB — GLUCOSE, CAPILLARY
Glucose-Capillary: 98 mg/dL (ref 70–99)
Glucose-Capillary: 120 mg/dL — ABNORMAL HIGH (ref 70–99)

## 2019-12-28 NOTE — ED Notes (Signed)
Donna May would like an update (brother) [phone]

## 2019-12-28 NOTE — Progress Notes (Signed)
Patient eating dinner at the time we attempted EKG. Will re-attempt after she finishes her meal.

## 2019-12-28 NOTE — Progress Notes (Signed)
PROGRESS NOTE  Donna May FFM:[id number] DOB: 1940-12-13 DOA: 12/27/2019 PCP: [name], MD  Brief History   The patient is a 79 yr old woman who presented to [facility] ED on 12/27/2019 with complaints of shortness of breath. She had had chemotherapy for cholangiocarcinoma earlier in the day. She also had increased lower extremity edema. She has chronic edema of the right arm due to prior mastectomy for BRCA in the 1990's.  In the ED her CXR demonstrated pulmonary edema and she had a BNP of 2301.  [facility] had been consulted to admit the patient for further evaluation and care.  Consultants  . None  Procedures  . None  Antibiotics   Anti-infectives (From admission, onward)   None    .  Subjective  The patient is resting comfortably. She continues to feel short of breath, particularly with lying flat. No new complaints.  Objective   Vitals:  Vitals:   12/28/19 1349 12/28/19 1447  BP: (!) 184/74 (!) 158/68  Pulse:  (!) 59  Resp:    Temp:  99.1 F (37.3 C)  SpO2:  100%   Exam:  Constitutional:  . The patient is awake, alert, and oriented x 3. No acute distress. Eyes:  . pupils and irises appear normal . Normal lids and conjunctivae ENMT:  . grossly normal hearing  . Lips appear normal . external ears, nose appear normal . Oropharynx: mucosa, tongue,posterior pharynx appear normal Neck:  . neck appears normal, no masses, normal ROM, supple . no thyromegaly Respiratory:  . No increased work of breathing. . No wheezes, rales, or rhonchi . No tactile fremitus Cardiovascular:  . Regular rate and rhythm . No murmurs, ectopy, or gallups. . No lateral PMI. No thrills. Abdomen:  . Abdomen is soft, non-tender, non-distended . No hernias, masses, or organomegaly . Normoactive bowel sounds.  Musculoskeletal:  . No cyanosis, clubbing, or edema Skin:  . No rashes, lesions, ulcers . palpation of skin: no induration or nodules Neurologic:  . CN  2-12 intact . Sensation all 4 extremities intact Psychiatric:  . Mental status o Mood, affect appropriate o Orientation to person, place, time  . judgment and insight appear intact  I have personally reviewed the following:   Today's Data  . Vitals, BMP, CBC  Imaging  . CXR: Pulmonary edema  Cardiology Data  . BNP 2301 . EKG - unchanged, LVH gives appearance of elevated ST segments. . Troponins - elevated at 28. Trend. No EKG changes indicating ischemia.  Scheduled Meds: . amLODipine  10 mg Oral Daily  . enoxaparin (LOVENOX) injection  30 mg Subcutaneous Q24H  . furosemide  40 mg Intravenous Daily  . hydrochlorothiazide  12.5 mg Oral Daily  . insulin aspart  0-15 Units Subcutaneous TID WC  . irbesartan  300 mg Oral Daily  . metoprolol succinate  100 mg Oral Daily  . sodium chloride flush  3 mL Intravenous Q12H   Continuous Infusions: . sodium chloride      Principal Problem:   Acute on chronic diastolic CHF (congestive heart failure) (HCC) Active Problems:   Diabetes mellitus with renal complications (HCC)   Essential hypertension   Intrahepatic cholangiocarcinoma (HCC)   Acute respiratory failure with hypoxia (HCC)   CKD (chronic kidney disease), stage III (HCC)   Hypertensive urgency   Acute respiratory failure (HCC)   LOS: 0 days   A & P  Acute on chronic diastolic CHF / HTN urgency: The patient has been admitted to the  CHF pathway. She is being diuresed with lasix 40 mg daily. She has been continued on home antihypertensives (avapro, HCTZ, and metoprolol). She will be monitored on telemetry. Creatinine, electrolytes and volume status will be closely monitored.  Cholangiocarcinoma: Cholangiocarcinoma has shown promising response to therapy. On 10/6 Dr. Krista Blue recd stopping chemo for the time being since they dont see any hypermetabolic lesions on PET scan.  Pt worried about progression of cancer so wanted to continue chemo.  HTN: The patient has been continued on  home avapro, HCTZ, and metoprolol, but remains hypertensive. Norvasc was added with minimal improvement.  Unable to increase metoprolol due to marginal heart rate. I will add hydralazine scheduled to improve BPcontrol.  CKD stage 3: Creatinine of 1.49 is baseline. This morning it is up to 1.77 with diuresis. Monitor closely. Avoid nephrotoxic medications and hypotension. Monitor creatinine, electrolytes, and volume status.  DMII: At home the patient's glucose is managed with Januvia. Here her glucoses are monitored by FSBS and SSI. Glucoses for the last 24 hours are 78-122.  I have seen and examined this patient myself. I have spent 36 minutes in her evaluation and care.  DVT prophylaxis: Lovenox Code Status: Patient wants her code status changed back to full code at this time she says. Family Communication: No family in room Disposition Plan: Home after breathing improved  [name], DO [facility] Direct contact: see [email or web address]  7PM-7AM contact night coverage as above 12/28/2019, 6:54 PM  LOS: 0 days

## 2019-12-29 ENCOUNTER — Inpatient Hospital Stay (HOSPITAL_COMMUNITY): Payer: Medicare Other

## 2019-12-29 DIAGNOSIS — N1832 Chronic kidney disease, stage 3b: Secondary | ICD-10-CM | POA: Diagnosis not present

## 2019-12-29 DIAGNOSIS — J189 Pneumonia, unspecified organism: Secondary | ICD-10-CM | POA: Diagnosis not present

## 2019-12-29 DIAGNOSIS — I5033 Acute on chronic diastolic (congestive) heart failure: Secondary | ICD-10-CM | POA: Diagnosis not present

## 2019-12-29 DIAGNOSIS — J9601 Acute respiratory failure with hypoxia: Secondary | ICD-10-CM | POA: Diagnosis not present

## 2019-12-29 LAB — CBC WITH DIFFERENTIAL/PLATELET
Abs Immature Granulocytes: 0.04 10*3/uL (ref 0.00–0.07)
Basophils Absolute: 0 10*3/uL (ref 0.0–0.1)
Basophils Relative: 0 %
Eosinophils Absolute: 0.1 10*3/uL (ref 0.0–0.5)
Eosinophils Relative: 1 %
HCT: 29.4 % — ABNORMAL LOW (ref 36.0–46.0)
Hemoglobin: 9.5 g/dL — ABNORMAL LOW (ref 12.0–15.0)
Immature Granulocytes: 1 %
Lymphocytes Relative: 7 %
Lymphs Abs: 0.6 10*3/uL — ABNORMAL LOW (ref 0.7–4.0)
MCH: 31.4 pg (ref 26.0–34.0)
MCHC: 32.3 g/dL (ref 30.0–36.0)
MCV: 97 fL (ref 80.0–100.0)
Monocytes Absolute: 0.3 10*3/uL (ref 0.1–1.0)
Monocytes Relative: 4 %
Neutro Abs: 7.4 10*3/uL (ref 1.7–7.7)
Neutrophils Relative %: 87 %
Platelets: 160 10*3/uL (ref 150–400)
RBC: 3.03 MIL/uL — ABNORMAL LOW (ref 3.87–5.11)
RDW: 18.5 % — ABNORMAL HIGH (ref 11.5–15.5)
WBC: 8.5 10*3/uL (ref 4.0–10.5)
nRBC: 0 % (ref 0.0–0.2)

## 2019-12-29 LAB — GLUCOSE, CAPILLARY
Glucose-Capillary: 98 mg/dL (ref 70–99)
Glucose-Capillary: 150 mg/dL — ABNORMAL HIGH (ref 70–99)
Glucose-Capillary: 115 mg/dL — ABNORMAL HIGH (ref 70–99)
Glucose-Capillary: 160 mg/dL — ABNORMAL HIGH (ref 70–99)

## 2019-12-29 LAB — BASIC METABOLIC PANEL
Anion gap: 12 (ref 5–15)
BUN: 44 mg/dL — ABNORMAL HIGH (ref 8–23)
CO2: 23 mmol/L (ref 22–32)
Calcium: 8.6 mg/dL — ABNORMAL LOW (ref 8.9–10.3)
Chloride: 105 mmol/L (ref 98–111)
Creatinine, Ser: 1.66 mg/dL — ABNORMAL HIGH (ref 0.44–1.00)
GFR, Estimated: 29 mL/min — ABNORMAL LOW (ref >60)
Glucose, Bld: 96 mg/dL (ref 70–99)
Potassium: 3.7 mmol/L (ref 3.5–5.1)
Sodium: 140 mmol/L (ref 135–145)

## 2019-12-29 MED ORDER — SODIUM CHLORIDE 0.9 % IV SOLN
1.0000 g | INTRAVENOUS | Status: DC
  Administered 2019-12-30: 1 g via INTRAVENOUS
  Filled 2019-12-29: qty 10

## 2019-12-29 MED ORDER — SODIUM CHLORIDE 0.9 % IV SOLN: 500.0000 mg | INTRAVENOUS | Status: DC

## 2019-12-29 MED ORDER — IBUPROFEN 200 MG PO TABS
400.0000 mg | ORAL_TABLET | Freq: Four times a day (QID) | ORAL | Status: DC | PRN
  Administered 2019-12-29: 400 mg via ORAL
  Filled 2019-12-29: qty 2

## 2019-12-29 MED ORDER — SODIUM CHLORIDE 0.9 % IV SOLN: 2.0000 g | INTRAVENOUS | Status: DC

## 2019-12-29 MED ORDER — SODIUM CHLORIDE 0.9 % IV SOLN
500.0000 mg | INTRAVENOUS | Status: DC
  Administered 2019-12-30: 500 mg via INTRAVENOUS
  Filled 2019-12-29 (×2): qty 500

## 2019-12-29 NOTE — Progress Notes (Signed)
   12/29/19 1730  Assess: MEWS Score  Temp (!) 101.3 F (38.5 C)  BP (!) 158/81  Pulse Rate 70  ECG Heart Rate 75  Resp 20  SpO2 97 %  O2 Device Room Air  Assess: MEWS Score  MEWS Temp 1  MEWS Systolic 0  MEWS Pulse 0  MEWS RR 0  MEWS LOC 0  MEWS Score 1  MEWS Score Color Green  Assess: if the MEWS score is Yellow or Red  Were vital signs taken at a resting state? Yes  Focused Assessment No change from prior assessment  Early Detection of Sepsis Score *See Row Information* Medium  MEWS guidelines implemented *See Row Information* Yes  Treat  MEWS Interventions Administered scheduled meds/treatments  Pain Scale 0-10  Pain Score 0  Take Vital Signs  Increase Vital Sign Frequency  Yellow: Q 2hr X 2 then Q 4hr X 2, if remains yellow, continue Q 4hrs  Document  Patient Outcome Other (Comment) (patient stable on department)  Progress note created (see row info) Yes

## 2019-12-29 NOTE — Consult Note (Signed)
   [facility] Inpatient Consult   12/29/2019  Donna May [birth year] [id number]   Patient is currently active with [facility] for chronic disease management services.  Patient has been engaged by a [facility] RN.   Plan: Will notify outpatient [facility] CM of patient hospital admission. Will continue to follow for progression and disposition needs.  Of note, [facility] Care Management services does not replace or interfere with any services that are arranged by inpatient case management or social work.  Netta Cedars, MSN,  Hospital Liaison Nurse Mobile Phone [phone]  Toll free office [phone]

## 2019-12-29 NOTE — Progress Notes (Signed)
   12/29/19 1342  Assess: MEWS Score  Temp (!) 102.4 F (39.1 C)  BP (!) 174/64  Pulse Rate 77  ECG Heart Rate 78  Resp 20  SpO2 99 %  O2 Device Room Air  Assess: MEWS Score  MEWS Temp 2  MEWS Systolic 0  MEWS Pulse 0  MEWS RR 0  MEWS LOC 0  MEWS Score 2  MEWS Score Color Yellow  Assess: if the MEWS score is Yellow or Red  Were vital signs taken at a resting state? Yes  Focused Assessment No change from prior assessment  Early Detection of Sepsis Score *See Row Information* Medium  MEWS guidelines implemented *See Row Information* Yes  Treat  MEWS Interventions Administered prn meds/treatments;Escalated (See documentation below)  Pain Scale 0-10  Pain Score 4  Pain Type Acute pain  Pain Location Leg  Pain Orientation Right;Left  Pain Descriptors / Indicators Aching  Pain Frequency Occasional  Patients Stated Pain Goal 0  Pain Intervention(s) Medication (See eMAR)  Escalate  MEWS: Escalate Yellow: discuss with charge nurse/RN and consider discussing with provider and RRT (notified Alyse Low, RN)  Notify: Charge Nurse/RN  Name of Charge Nurse/RN Notified Christy, RN  Date Charge Nurse/RN Notified 12/29/19  Time Charge Nurse/RN Notified 55  Notify: Provider  Provider Name/Title Swayze, MD  Date Provider Notified 12/29/19  Time Provider Notified 1358  Notification Type  (secure chat)  Notification Reason Other (Comment) (102.40F temp)  Response No new orders  Date of Provider Response 12/29/19  Time of Provider Response 1400  Document  Patient Outcome Other (Comment) (stable on department)  Progress note created (see row info) Yes

## 2019-12-29 NOTE — Progress Notes (Signed)
   12/29/19 1627  Assess: MEWS Score  Temp (!) 100.4 F (38 C)  BP (!) 163/61  Pulse Rate 74  ECG Heart Rate 76  Resp 18  Level of Consciousness Alert  SpO2 98 %  O2 Device Room Air  Assess: MEWS Score  MEWS Temp 0  MEWS Systolic 0  MEWS Pulse 0  MEWS RR 0  MEWS LOC 0  MEWS Score 0  MEWS Score Color Green  Assess: if the MEWS score is Yellow or Red  Were vital signs taken at a resting state? Yes  Focused Assessment No change from prior assessment  Early Detection of Sepsis Score *See Row Information* Medium  MEWS guidelines implemented *See Row Information* Yes  Treat  MEWS Interventions Escalated (See documentation below)  Pain Scale 0-10  Pain Score 0  Take Vital Signs  Increase Vital Sign Frequency  Yellow: Q 2hr X 2 then Q 4hr X 2, if remains yellow, continue Q 4hrs  Escalate  MEWS: Escalate Yellow: discuss with charge nurse/RN and consider discussing with provider and RRT  Notify: Provider  Provider Name/Title Swayze, DO  Date Provider Notified 12/29/19  Time Provider Notified [phone]  Notification Type  (secure chat)  Notification Reason Other (Comment) (temp 100.57F oral after tylenol)  Document  Patient Outcome Other (Comment) (stable on department)  Progress note created (see row info) Yes

## 2019-12-29 NOTE — Progress Notes (Signed)
Report given to [place], Therapist, sports. Patient is alert and oriented x4 sitting up in bed with call light in reach. VSS. Patient tolerating room air well. IV Lasix in progress for diuresing.  Patient would like her port-a-cath accessed due to being a difficult stick, awaiting to hear from attending MD.  Hgb 9.5, Hct 29.4, platelets 160, K+ 3.7 and Creat 1.66 this AM.

## 2019-12-29 NOTE — Progress Notes (Signed)
PROGRESS NOTE  Donna May TGG:[id number] DOB: December 14, 1940 DOA: 12/27/2019 PCP: [name], MD  Brief History   The patient is a 79 yr old woman who presented to [facility] ED on 12/27/2019 with complaints of shortness of breath. She had had chemotherapy for cholangiocarcinoma earlier in the day. She also had increased lower extremity edema. She has chronic edema of the right arm due to prior mastectomy for BRCA in the 1990's.  In the ED her CXR demonstrated pulmonary edema and she had a BNP of 2301.  [facility] had been consulted to admit the patient for further evaluation and care.  The patient has had fevers today. Will recheck CXR.  Consultants  . None  Procedures  . None  Antibiotics   Anti-infectives (From admission, onward)   Start     Dose/Rate Route Frequency Ordered Stop   12/29/19 1915  cefTRIAXone (ROCEPHIN) 1 g in sodium chloride 0.9 % 100 mL IVPB        1 g 200 mL/hr over 30 Minutes Intravenous Every 24 hours 12/29/19 1820     12/29/19 1915  azithromycin (ZITHROMAX) 500 mg in sodium chloride 0.9 % 250 mL IVPB        500 mg 250 mL/hr over 60 Minutes Intravenous Every 24 hours 12/29/19 1820       Subjective  The patient is resting comfortably. No new complaints.  Objective   Vitals:  Vitals:   12/29/19 1627 12/29/19 1730  BP: (!) 163/61 (!) 158/81  Pulse: 74 70  Resp: 18 20  Temp: (!) 100.4 F (38 C) (!) 101.3 F (38.5 C)  SpO2: 98% 97%   Exam:  Constitutional:  . The patient is awake, alert, and oriented x 3. No acute distress. Marland Kitchen  Respiratory:  . No increased work of breathing. . No wheezes or rhonchi . Rales in right mid-lung region. . No tactile fremitus Cardiovascular:  . Regular rate and rhythm . No murmurs, ectopy, or gallups. . No lateral PMI. No thrills. Abdomen:  . Abdomen is soft, non-tender, non-distended . No hernias, masses, or organomegaly . Normoactive bowel sounds.  Musculoskeletal:  . No cyanosis, clubbing, or  edema Skin:  . No rashes, lesions, ulcers . palpation of skin: no induration or nodules Neurologic:  . CN 2-12 intact . Sensation all 4 extremities intact Psychiatric:  . Mental status o Mood, affect appropriate o Orientation to person, place, time  . judgment and insight appear intact  I have personally reviewed the following:   Today's Data  . Vitals, BMP, CBC  Imaging  . CXR: Pulmonary edema vs infiltrate  Cardiology Data  . BNP 2301 . EKG - unchanged, LVH gives appearance of elevated ST segments. . Troponins - elevated at 28. Trend. No EKG changes indicating ischemia.  Scheduled Meds: . amLODipine  10 mg Oral Daily  . enoxaparin (LOVENOX) injection  30 mg Subcutaneous Q24H  . furosemide  40 mg Intravenous Daily  . hydrochlorothiazide  12.5 mg Oral Daily  . insulin aspart  0-15 Units Subcutaneous TID WC  . irbesartan  300 mg Oral Daily  . metoprolol succinate  100 mg Oral Daily  . sodium chloride flush  3 mL Intravenous Q12H   Continuous Infusions: . sodium chloride    . azithromycin    . cefTRIAXone (ROCEPHIN)  IV      Principal Problem:   Acute on chronic diastolic CHF (congestive heart failure) (HCC) Active Problems:   Diabetes mellitus with renal complications ([place])  Essential hypertension   Intrahepatic cholangiocarcinoma (HCC)   Acute respiratory failure with hypoxia (HCC)   CKD (chronic kidney disease), stage III ([place])   Hypertensive urgency   Acute respiratory failure (HCC)   LOS: 1 day   A & P  Acute on chronic diastolic CHF / HTN urgency: The patient has been admitted to the CHF pathway. She is being diuresed with lasix 40 mg daily. She has been continued on home antihypertensives (avapro, HCTZ, and metoprolol). She will be monitored on telemetry. Creatinine, electrolytes and volume status will be closely monitored.  Febrile illness: Concern for pneumonia instead of pulmonary edema. Will check blood cultures x 2, CXR, and procalcitonin. I have  started the patient on rocephin and azithromycin. Monitor.  Cholangiocarcinoma: Cholangiocarcinoma has shown promising response to therapy. On 10/6 Dr. Krista Blue recd stopping chemo for the time being since they dont see any hypermetabolic lesions on PET scan.  Pt worried about progression of cancer so wanted to continue chemo.  HTN: The patient has been continued on home avapro, HCTZ, and metoprolol, but remains hypertensive. Norvasc was added with minimal improvement.  Unable to increase metoprolol due to marginal heart rate. I will add hydralazine scheduled to improve BPcontrol.  CKD stage 3: Creatinine of 1.49 is baseline. This morning it is up to 1.77 with diuresis. Monitor closely. Avoid nephrotoxic medications and hypotension. Monitor creatinine, electrolytes, and volume status.  DMII: At home the patient's glucose is managed with Januvia. Here her glucoses are monitored by FSBS and SSI. Glucoses for the last 24 hours are 78-122.  I have seen and examined this patient myself. I have spent 38 minutes in her evaluation and care.  DVT prophylaxis: Lovenox Code Status: Patient wants her code status changed back to full code at this time she says. Family Communication: No family in room Disposition Plan: Home after breathing improved  Terrie Grajales, DO [facility] Direct contact: see [email or web address]  7PM-7AM contact night coverage as above 12/29/2019, 6:24 PM  LOS: 0 days

## 2019-12-29 NOTE — Progress Notes (Signed)
[facility] ([facility]) Community Based Palliative Care       This patient is enrolled in our palliative care services in the community.  [facility] will continue to follow for any discharge planning needs and to coordinate continuation of palliative care.   If you have questions or need assistance, please call [phone] or contact the hospital Liaison listed on AMION.     Thank you for the opportunity to participate in this patient's care.     Chrislyn King, BSN, RN [facility] Hospital Liaison   [phone]   

## 2019-12-30 DIAGNOSIS — I5033 Acute on chronic diastolic (congestive) heart failure: Secondary | ICD-10-CM | POA: Diagnosis not present

## 2019-12-30 LAB — RESPIRATORY PANEL BY PCR

## 2019-12-30 LAB — HIV ANTIBODY (ROUTINE TESTING W REFLEX): HIV Screen 4th Generation wRfx: NONREACTIVE

## 2019-12-30 LAB — GLUCOSE, CAPILLARY
Glucose-Capillary: 83 mg/dL (ref 70–99)
Glucose-Capillary: 157 mg/dL — ABNORMAL HIGH (ref 70–99)
Glucose-Capillary: 91 mg/dL (ref 70–99)
Glucose-Capillary: 96 mg/dL (ref 70–99)

## 2019-12-30 LAB — STREP PNEUMONIAE URINARY ANTIGEN: Strep Pneumo Urinary Antigen: NEGATIVE

## 2019-12-30 LAB — BASIC METABOLIC PANEL
Anion gap: 11 (ref 5–15)
BUN: 51 mg/dL — ABNORMAL HIGH (ref 8–23)
CO2: 25 mmol/L (ref 22–32)
Calcium: 8.4 mg/dL — ABNORMAL LOW (ref 8.9–10.3)
Chloride: 102 mmol/L (ref 98–111)
Creatinine, Ser: 1.97 mg/dL — ABNORMAL HIGH (ref 0.44–1.00)
GFR, Estimated: 23 mL/min — ABNORMAL LOW (ref >60)
Glucose, Bld: 111 mg/dL — ABNORMAL HIGH (ref 70–99)
Potassium: 3.4 mmol/L — ABNORMAL LOW (ref 3.5–5.1)
Sodium: 138 mmol/L (ref 135–145)

## 2019-12-30 LAB — EXPECTORATED SPUTUM ASSESSMENT W GRAM STAIN, RFLX TO RESP C

## 2019-12-30 MED ORDER — POTASSIUM CHLORIDE CRYS ER 20 MEQ PO TBCR
40.0000 meq | EXTENDED_RELEASE_TABLET | Freq: Once | ORAL | Status: AC
  Administered 2019-12-30: 40 meq via ORAL
  Filled 2019-12-30: qty 2

## 2019-12-30 NOTE — Progress Notes (Signed)
PROGRESS NOTE  Donna May VZD:[id number] DOB: [birth year] DOA: 12/27/2019 PCP: [name], MD  Brief History   The patient is a 79 yr old woman who presented to [facility] ED on 12/27/2019 with complaints of shortness of breath. She had had chemotherapy for cholangiocarcinoma earlier in the day. She also had increased lower extremity edema. She has chronic edema of the right arm due to prior mastectomy for BRCA in the 1990's.  In the ED her CXR demonstrated pulmonary edema and she had a BNP of 2301.  [facility] had been consulted to admit the patient for further evaluation and care.  The patient has had fevers today. CXR was re-evaluated and demonstrated clearing of the pulmonary edema. It demonstrated no new acute abnormalities.  The patient will be evaluated by PT/OT.  Consultants  . None  Procedures  . None  Antibiotics   Anti-infectives (From admission, onward)   Start     Dose/Rate Route Frequency Ordered Stop   12/29/19 1915  cefTRIAXone (ROCEPHIN) 1 g in sodium chloride 0.9 % 100 mL IVPB        1 g 200 mL/hr over 30 Minutes Intravenous Every 24 hours 12/29/19 1820 01/03/20 1914   12/29/19 1915  azithromycin (ZITHROMAX) 500 mg in sodium chloride 0.9 % 250 mL IVPB        500 mg 250 mL/hr over 60 Minutes Intravenous Every 24 hours 12/29/19 1820 01/03/20 1914   12/29/19 1915  cefTRIAXone (ROCEPHIN) 2 g in sodium chloride 0.9 % 100 mL IVPB  Status:  Discontinued        2 g 200 mL/hr over 30 Minutes Intravenous Every 24 hours 12/29/19 1827 12/29/19 1833   12/29/19 1915  azithromycin (ZITHROMAX) 500 mg in sodium chloride 0.9 % 250 mL IVPB  Status:  Discontinued        500 mg 250 mL/hr over 60 Minutes Intravenous Every 24 hours 12/29/19 1827 12/29/19 1833     Subjective  The patient is resting comfortably. No new complaints.  Objective   Vitals:  Vitals:   12/30/19 0800 12/30/19 0820  BP:  (!) 153/63  Pulse: 70 73  Resp:  16  Temp:  99.6 F (37.6 C)    SpO2: 96% 96%   Exam:  Constitutional:  . The patient is awake, alert, and oriented x 3. No acute distress. Marland Kitchen  Respiratory:  . No increased work of breathing. . No wheezes or rhonchi . Rales in right mid-lung region. . No tactile fremitus Cardiovascular:  . Regular rate and rhythm . No murmurs, ectopy, or gallups. . No lateral PMI. No thrills. Abdomen:  . Abdomen is soft, non-tender, non-distended . No hernias, masses, or organomegaly . Normoactive bowel sounds.  Musculoskeletal:  . No cyanosis, clubbing, or edema Skin:  . No rashes, lesions, ulcers . palpation of skin: no induration or nodules Neurologic:  . CN 2-12 intact . Sensation all 4 extremities intact Psychiatric:  . Mental status o Mood, affect appropriate o Orientation to person, place, time  . judgment and insight appear intact  I have personally reviewed the following:   Today's Data  . Vitals, BMP  Imaging  . CXR: Pulmonary edema vs infiltrate  Cardiology Data  . BNP 2301 . EKG - unchanged, LVH gives appearance of elevated ST segments. . Troponins - elevated at 28. Trend. No EKG changes indicating ischemia.  Scheduled Meds: . amLODipine  10 mg Oral Daily  . enoxaparin (LOVENOX) injection  30 mg Subcutaneous Q24H  .  furosemide  40 mg Intravenous Daily  . hydrochlorothiazide  12.5 mg Oral Daily  . insulin aspart  0-15 Units Subcutaneous TID WC  . irbesartan  300 mg Oral Daily  . metoprolol succinate  100 mg Oral Daily  . potassium chloride  40 mEq Oral Once  . sodium chloride flush  3 mL Intravenous Q12H   Continuous Infusions: . sodium chloride    . azithromycin Stopped (12/30/19 0445)  . cefTRIAXone (ROCEPHIN)  IV Stopped (12/30/19 0569)    Principal Problem:   Acute on chronic diastolic CHF (congestive heart failure) (HCC) Active Problems:   Diabetes mellitus with renal complications (HCC)   Essential hypertension   Intrahepatic cholangiocarcinoma (HCC)   Acute respiratory failure  with hypoxia (HCC)   CKD (chronic kidney disease), stage III ([place])   Hypertensive urgency   Acute respiratory failure ([place])   LOS: 2 days   A & P  Acute on chronic diastolic CHF / HTN urgency: Resolving. The patient has been admitted to the CHF pathway. She is being diuresed with lasix 40 mg daily. She has been continued on home antihypertensives (avapro, HCTZ, and metoprolol). She will be monitored on telemetry. Creatinine, electrolytes and volume status will be closely monitored.   Febrile illness: Concern for pneumonia instead of pulmonary edema. I started the patient on rocephin and azithromycin. Repeat CXR negative for infiltrates. Will stop IV antibiotics as no source of infection has been identified. Respiratory panel is negative. Blood cultures x 2 gave had no growth.  Monitor.  Cholangiocarcinoma: Cholangiocarcinoma has shown promising response to therapy. On 10/6 Dr. Krista Blue recd stopping chemo for the time being since they dont see any hypermetabolic lesions on PET scan.  Pt worried about progression of cancer so wanted to continue chemo.  HTN: The patient has been continued on home avapro, HCTZ, and metoprolol, but remains hypertensive. Norvasc was added with minimal improvement.  Unable to increase metoprolol due to marginal heart rate. I will add hydralazine scheduled to improve BPcontrol.  CKD stage 3: Creatinine of 1.49 is baseline. This morning it is up to 1.99 with diuresis. Monitor closely. Avoid nephrotoxic medications and hypotension. Monitor creatinine, electrolytes, and volume status.  DMII: At home the patient's glucose is managed with Januvia. Here her glucoses are monitored by FSBS and SSI. Glucoses for the last 24 hours are 83-157.  Debility: The patient will be evaluated by PT/OT for disposition recommendations.  I have seen and examined this patient myself. I have spent 32 minutes in her evaluation and care.  DVT prophylaxis: Lovenox Code Status: Patient wants her  code status changed back to full code at this time she says. Family Communication: No family in room Disposition Plan: Home after breathing improved  Rawson Minix, DO [facility] Direct contact: see [email or web address]  7PM-7AM contact night coverage as above 12/30/2019, 5:40 PM  LOS: 0 days

## 2019-12-30 NOTE — Progress Notes (Signed)
Report given to Belenda Cruise, Therapist, sports. Patient is alert and oriented x4 resting in bed with call light in reach. VSS. Sputum culture and urine sample sent overnight due to fevers noted on prior shift. ACHS. SR on tele. IV Lasix in progress for diuresis. Droplet precautions in place until respiratory panel results. IV antibiotics also in place. Patient likely to be [place] home Monday or Tuesday. K+ 3.4, Creat 1.97 and strep PNA in urine negative.

## 2019-12-30 NOTE — Progress Notes (Signed)
[facility] ([facility]) Community Based Palliative Care   °    °This patient is enrolled in our palliative care services in the community.  [facility] will continue to follow for any discharge planning needs and to coordinate continuation of palliative care. ° °Thank you, °Jennifer Woody RN, BSN, CCRN °[facility] Hospital Liaison  °

## 2019-12-31 DIAGNOSIS — I5033 Acute on chronic diastolic (congestive) heart failure: Secondary | ICD-10-CM | POA: Diagnosis not present

## 2019-12-31 LAB — BASIC METABOLIC PANEL
Anion gap: 13 (ref 5–15)
BUN: 54 mg/dL — ABNORMAL HIGH (ref 8–23)
CO2: 21 mmol/L — ABNORMAL LOW (ref 22–32)
Calcium: 8.3 mg/dL — ABNORMAL LOW (ref 8.9–10.3)
Chloride: 104 mmol/L (ref 98–111)
Creatinine, Ser: 2.19 mg/dL — ABNORMAL HIGH (ref 0.44–1.00)
GFR, Estimated: 21 mL/min — ABNORMAL LOW (ref >60)
Glucose, Bld: 95 mg/dL (ref 70–99)
Potassium: 4.2 mmol/L (ref 3.5–5.1)
Sodium: 138 mmol/L (ref 135–145)

## 2019-12-31 LAB — GLUCOSE, CAPILLARY
Glucose-Capillary: 78 mg/dL (ref 70–99)
Glucose-Capillary: 199 mg/dL — ABNORMAL HIGH (ref 70–99)
Glucose-Capillary: 144 mg/dL — ABNORMAL HIGH (ref 70–99)
Glucose-Capillary: 136 mg/dL — ABNORMAL HIGH (ref 70–99)

## 2019-12-31 MED ORDER — HYDRALAZINE HCL 50 MG PO TABS
50.0000 mg | ORAL_TABLET | Freq: Three times a day (TID) | ORAL | Status: DC
  Administered 2019-12-31 – 2020-01-04 (×14): 50 mg via ORAL
  Filled 2019-12-31 (×14): qty 1

## 2019-12-31 MED ORDER — FUROSEMIDE 40 MG PO TABS
40.0000 mg | ORAL_TABLET | Freq: Every day | ORAL | Status: DC
  Administered 2020-01-01 – 2020-01-04 (×4): 40 mg via ORAL
  Filled 2019-12-31 (×4): qty 1

## 2019-12-31 MED ORDER — IBUPROFEN 200 MG PO TABS
400.0000 mg | ORAL_TABLET | Freq: Four times a day (QID) | ORAL | Status: DC | PRN
  Administered 2020-01-01: 400 mg via ORAL
  Filled 2019-12-31: qty 2

## 2019-12-31 MED ORDER — LORATADINE 10 MG PO TABS
10.0000 mg | ORAL_TABLET | Freq: Every day | ORAL | Status: DC
  Administered 2019-12-31 – 2020-01-03 (×4): 10 mg via ORAL
  Filled 2019-12-31 (×6): qty 1

## 2019-12-31 MED ORDER — SALINE SPRAY 0.65 % NA SOLN
2.0000 | NASAL | Status: DC | PRN
  Filled 2019-12-31: qty 44

## 2019-12-31 NOTE — Progress Notes (Addendum)
PROGRESS NOTE  Donna May GUR:[id number] DOB: 04-10-1940 DOA: 12/27/2019 PCP: [name], MD  Brief History   The patient is a 79 yr old woman who presented to [facility] ED on 12/27/2019 with complaints of shortness of breath. She had had chemotherapy for cholangiocarcinoma earlier in the day. She also had increased lower extremity edema. She has chronic edema of the right arm due to prior mastectomy for BRCA in the 1990's.  In the ED her CXR demonstrated pulmonary edema and she had a BNP of 2301.  [facility] had been consulted to admit the patient for further evaluation and care.  The patient has had fevers of 101.6 and 100.8 on the morning of 12/30/2019. No source of infection has been identified. CXR was re-evaluated and demonstrated clearing of the pulmonary edema. It demonstrated no new acute abnormalities.  The patient has been evaluated by PT/OT. They have recommended discharge to home with home health PT/OT.  Consultants  None  Procedures  None  Antibiotics   Anti-infectives (From admission, onward)    Start     Dose/Rate Route Frequency Ordered Stop   12/29/19 1915  cefTRIAXone (ROCEPHIN) 1 g in sodium chloride 0.9 % 100 mL IVPB  Status:  Discontinued        1 g 200 mL/hr over 30 Minutes Intravenous Every 24 hours 12/29/19 1820 12/30/19 1741   12/29/19 1915  azithromycin (ZITHROMAX) 500 mg in sodium chloride 0.9 % 250 mL IVPB  Status:  Discontinued        500 mg 250 mL/hr over 60 Minutes Intravenous Every 24 hours 12/29/19 1820 12/30/19 1741   12/29/19 1915  cefTRIAXone (ROCEPHIN) 2 g in sodium chloride 0.9 % 100 mL IVPB  Status:  Discontinued        2 g 200 mL/hr over 30 Minutes Intravenous Every 24 hours 12/29/19 1827 12/29/19 1833   12/29/19 1915  azithromycin (ZITHROMAX) 500 mg in sodium chloride 0.9 % 250 mL IVPB  Status:  Discontinued        500 mg 250 mL/hr over 60 Minutes Intravenous Every 24 hours 12/29/19 1827 12/29/19 1833      Subjective    The patient is resting comfortably. No new complaints.  Objective   Vitals:  Vitals:   12/31/19 1126 12/31/19 1332  BP: (!) 124/49 (!) 141/61  Pulse: 74   Resp: 18   Temp: 99.4 F (37.4 C)   SpO2: 96%    Exam:  Constitutional:  The patient is awake, alert, and oriented x 3. No acute distress.  Respiratory:  No increased work of breathing. No wheezes or rhonchi Rales in right mid-lung region. No tactile fremitus Cardiovascular:  Regular rate and rhythm No murmurs, ectopy, or gallups. No lateral PMI. No thrills. Abdomen:  Abdomen is soft, non-tender, non-distended No hernias, masses, or organomegaly Normoactive bowel sounds.  Musculoskeletal:  No cyanosis, clubbing, or edema Skin:  No rashes, lesions, ulcers palpation of skin: no induration or nodules Neurologic:  CN 2-12 intact Sensation all 4 extremities intact Psychiatric:  Mental status Mood, affect appropriate Orientation to person, place, time  judgment and insight appear intact  I have personally reviewed the following:   Today's Data  Vitals, BMP  Imaging  CXR: Pulmonary edema vs infiltrate  Cardiology Data  BNP 2301 EKG - unchanged, LVH gives appearance of elevated ST segments. Troponins - elevated at 28. Trend. No EKG changes indicating ischemia.  Scheduled Meds:  amLODipine  10 mg Oral Daily   enoxaparin (LOVENOX)  injection  30 mg Subcutaneous Q24H   furosemide  40 mg Oral Daily   hydrALAZINE  50 mg Oral Q8H   insulin aspart  0-15 Units Subcutaneous TID WC   irbesartan  300 mg Oral Daily   loratadine  10 mg Oral Daily   metoprolol succinate  100 mg Oral Daily   sodium chloride flush  3 mL Intravenous Q12H   Continuous Infusions:  sodium chloride      Principal Problem:   Acute on chronic diastolic CHF (congestive heart failure) (HCC) Active Problems:   Diabetes mellitus with renal complications (HCC)   Essential hypertension   Intrahepatic cholangiocarcinoma (HCC)   Acute  respiratory failure with hypoxia (HCC)   CKD (chronic kidney disease), stage III ([place])   Hypertensive urgency   Acute respiratory failure (HCC)   LOS: 3 days   A & P  Acute on chronic diastolic CHF / HTN urgency: Resolving. The patient has been admitted to the CHF pathway. She is being diuresed with lasix 40 mg daily. She has been continued on home antihypertensives (avapro, HCTZ, and metoprolol). She will be monitored on telemetry. Creatinine, electrolytes and volume status will be closely monitored.   Febrile illness: Concern for pneumonia instead of pulmonary edema. I started the patient on rocephin and azithromycin. Repeat CXR negative for infiltrates. Will stop IV antibiotics as no source of infection has been identified. Respiratory panel is negative. Blood cultures x 2 gave had no growth.  Monitor.  Cholangiocarcinoma: Cholangiocarcinoma has shown promising response to therapy. On 10/6 Dr. Krista Blue recd stopping chemo for the time being since they dont see any hypermetabolic lesions on PET scan.  Pt worried about progression of cancer so wanted to continue chemo.  HTN: The patient has been continued on home avapro, HCTZ, and metoprolol, but remains hypertensive. Norvasc was added with minimal improvement.  Unable to increase metoprolol due to marginal heart rate. I will add hydralazine scheduled to improve BPcontrol.  Acute Kidney Injury on CKD stage 4: Creatinine of 1.49 is baseline. This morning it is up to 1.99 with diuresis. Monitor closely. Avoid nephrotoxic medications and hypotension. Monitor creatinine, electrolytes, and volume status.  DMII: At home the patient's glucose is managed with Januvia. Here her glucoses are monitored by FSBS and SSI. Glucoses for the last 24 hours are 83-157.  Debility: The patient was evaluated by PT/OT for disposition recommendations. They have recommended home with home health PT/OT.  I have seen and examined this patient myself. I have spent 32 minutes  in her evaluation and care.  DVT prophylaxis: Lovenox Code Status: Patient wants her code status changed back to full code at this time she says. Family Communication: No family in room Disposition Plan: Home after breathing improved  Elexius Minar, DO [facility] Direct contact: see [email or web address]  7PM-7AM contact night coverage as above 12/31/2019, 4:24 PM  LOS: 0 days

## 2019-12-31 NOTE — Plan of Care (Signed)
  Problem: Clinical Measurements: Goal: Ability to maintain clinical measurements within normal limits will improve Outcome: Progressing Goal: Respiratory complications will improve Outcome: Progressing Goal: Cardiovascular complication will be avoided Outcome: Progressing   Problem: Activity: Goal: Risk for activity intolerance will decrease Outcome: Progressing   Problem: Nutrition: Goal: Adequate nutrition will be maintained Outcome: Progressing

## 2019-12-31 NOTE — Evaluation (Signed)
Physical Therapy Evaluation Patient Details Name: Donna May MRN: [id number] DOB: 1940/12/15 Today's Date: 12/31/2019   History of Present Illness  Patient is a 79 y/o female who presents with SOB, LE edema. Admitted with CHF exacerbation and HTN urgency. CXR-pukmonary edema. PMH includes DM, CKD III, anemia and cholangiocarcinoma.  Clinical Impression  Patient presents with generalized weakness, decreased activity tolerance, dyspnea on exertion, pain in bil feet, impaired balance and impaired mobility s/p above. Pt lives alone and reports being Mod I with SPC PTA. Drives and does own ADLs. Reports no falls. Has neighbors that can assist at d/c. Today, pt tolerated transfers and gait training with Min A for balance/safety. Unable to get 02 reading with mobility today. Pt does better with BUE support on RW during ambulation. Encouraged use of RW at home initially until strength improves. Encouraged increasing activity and using BSC/walking to bathroom instead of purewick. Will follow acutely to maximize independence and mobility prior to return home.    Follow Up Recommendations Home health PT;Supervision for mobility/OOB    Equipment Recommendations  None recommended by PT    Recommendations for Other Services OT consult     Precautions / Restrictions Precautions Precautions: Fall Restrictions Weight Bearing Restrictions: No      Mobility  Bed Mobility Overal bed mobility: Needs Assistance Bed Mobility: Supine to Sit     Supine to sit: Min guard;HOB elevated     General bed mobility comments: Increased time, use of rail to get to EOB.  Transfers Overall transfer level: Needs assistance Equipment used: None Transfers: Sit to/from [facility] Sit to Stand: Min assist Stand pivot transfers: Min assist       General transfer comment: ASsist to power to standing with cues for hand placement and multiple attempts; SPT bed to/from [facility] with Min assist.  Unsteady  Ambulation/Gait Ambulation/Gait assistance: Min assist Gait Distance (Feet): 60 Feet Assistive device: Rolling walker (2 wheeled);Straight cane Gait Pattern/deviations: Step-to pattern;Step-through pattern;Decreased stride length;Narrow base of support Gait velocity: decreased Gait velocity interpretation: <1.8 ft/sec, indicate of risk for recurrent falls General Gait Details: Slow, unsteady gait using SPC requiring Min A; balance improved with use of RW, continues to have slow gait speed and bil knee instability but no buckling.  Stairs            Wheelchair Mobility    Modified Rankin (Stroke Patients Only)       Balance Overall balance assessment: Needs assistance Sitting-balance support: Feet supported;No upper extremity supported Sitting balance-Leahy Scale: Good     Standing balance support: During functional activity Standing balance-Leahy Scale: Poor Standing balance comment: Able to perform pericare but requires Min A for balance. Does better with BUE support for ambulation                             Pertinent Vitals/Pain Pain Assessment: Faces Faces Pain Scale: Hurts little more Pain Location: feet Pain Descriptors / Indicators: Tingling;Pins and needles Pain Intervention(s): Limited activity within patient's tolerance;Monitored during session;Repositioned    Home Living Family/patient expects to be discharged to:: Private residence Living Arrangements: Alone Available Help at Discharge: Neighbor;Available PRN/intermittently Type of Home: House Home Access: Stairs to enter Entrance Stairs-Rails: Right;Left;Can reach both Entrance Stairs-Number of Steps: 3 Home Layout: One level Home Equipment: Cane - single point;Walker - 2 wheels Additional Comments: uses cane when necessary but not usually.    Prior Function Level of Independence: Independent with assistive device(s)  Comments: Uses SPC as needed. No falls. Drives.  Does IADLs, eats out.     Hand Dominance   Dominant Hand: Right    Extremity/Trunk Assessment   Upper Extremity Assessment Upper Extremity Assessment: Defer to OT evaluation    Lower Extremity Assessment Lower Extremity Assessment: Generalized weakness;RLE deficits/detail;LLE deficits/detail RLE Deficits / Details: tingling/pins and needles in feet RLE Sensation: history of peripheral neuropathy;decreased light touch LLE Deficits / Details: tingling/pins and needles in feet LLE Sensation: history of peripheral neuropathy;decreased light touch       Communication   Communication: No difficulties  Cognition Arousal/Alertness: Awake/alert Behavior During Therapy: WFL for tasks assessed/performed Overall Cognitive Status: No family/caregiver present to determine baseline cognitive functioning                                 General Comments: Requires repetition to follow commands. STM deficits noted. At first states, September then October when asked the month. Poor awareness of safety.      General Comments      Exercises     Assessment/Plan    PT Assessment Patient needs continued PT services  PT Problem List Decreased strength;Decreased mobility;Decreased safety awareness;Decreased cognition;Pain;Impaired sensation;Decreased balance;Decreased knowledge of use of DME;Decreased activity tolerance       PT Treatment Interventions Therapeutic activities;Gait training;Therapeutic exercise;Patient/family education;Balance training;Functional mobility training;Stair training;DME instruction    PT Goals (Current goals can be found in the Care Plan section)  Acute Rehab PT Goals Patient Stated Goal: to go home PT Goal Formulation: With patient Time For Goal Achievement: 01/14/20 Potential to Achieve Goals: Good    Frequency Min 3X/week   Barriers to discharge Decreased caregiver support lives alone    Co-evaluation               AM-PAC PT "6  Clicks" Mobility  Outcome Measure Help needed turning from your back to your side while in a flat bed without using bedrails?: None Help needed moving from lying on your back to sitting on the side of a flat bed without using bedrails?: A Little Help needed moving to and from a bed to a chair (including a wheelchair)?: A Little Help needed standing up from a chair using your arms (e.g., wheelchair or bedside chair)?: A Little Help needed to walk in hospital room?: A Little Help needed climbing 3-5 steps with a railing? : A Little 6 Click Score: 19    End of Session Equipment Utilized During Treatment: Gait belt Activity Tolerance: Patient limited by fatigue Patient left: in bed;with call bell/phone within reach;with bed alarm set Nurse Communication: Mobility status;Other (comment) (purewick needs to be replaced) PT Visit Diagnosis: Muscle weakness (generalized) (M62.81);Unsteadiness on feet (R26.81)    Time: 6754-4920 PT Time Calculation (min) (ACUTE ONLY): 28 min   Charges:   PT Evaluation $PT Eval Moderate Complexity: 1 Mod PT Treatments $Therapeutic Activity: 8-22 mins        Marisa Severin, PT, DPT Acute Rehabilitation Services Pager [phone] Office [phone]      Donna May 12/31/2019, 10:22 AM

## 2019-12-31 NOTE — Evaluation (Signed)
Occupational Therapy Evaluation Patient Details Name: Donna May MRN: [id number] DOB: [birth year] Today's Date: 12/31/2019    History of Present Illness Patient is a 79 y/o female who presents with SOB, LE edema. Admitted with CHF exacerbation and HTN urgency. CXR-pukmonary edema. PMH includes DM, CKD III, anemia and cholangiocarcinoma.   Clinical Impression   PTA pt living alone, functioning at mod independent level. Pt reports she drives, does all ADL/IADL, and goes out to eat. At time of eval, pt requires min guard assist for bed mobility and roughly min A for mobility with RW (pt normally uses cane). Noted cognitive deficits in STM and safety, requiring repetition for basic tasks (unsure of baseline). Given current status, recommend HHOT at d/c for continued ADL progression and safety in home environment. Will continue to follow per POC listed below.     Follow Up Recommendations  Home health OT;Supervision/Assistance - 24 hour    Equipment Recommendations  3 in 1 bedside commode    Recommendations for Other Services       Precautions / Restrictions Precautions Precautions: Fall Restrictions Weight Bearing Restrictions: No      Mobility Bed Mobility Overal bed mobility: Needs Assistance Bed Mobility: Supine to Sit     Supine to sit: Min guard;HOB elevated     General bed mobility comments: Increased time, use of rail to get to EOB.  Transfers                      Balance Overall balance assessment: Needs assistance Sitting-balance support: Feet supported;No upper extremity supported Sitting balance-Leahy Scale: Good                                     ADL either performed or assessed with clinical judgement   ADL Overall ADL's : Needs assistance/impaired Eating/Feeding: Set up;Sitting   Grooming: Set up;Sitting   Upper Body Bathing: Minimal assistance;Sitting   Lower Body Bathing: Moderate assistance;Sitting/lateral leans;Sit  to/from stand   Upper Body Dressing : Minimal assistance;Sitting   Lower Body Dressing: Moderate assistance;Sitting/lateral leans;Sit to/from stand   Toilet Transfer: Minimal assistance;Regular Toilet;Grab bars;RW   Toileting- Clothing Manipulation and Hygiene: Set up;Sitting/lateral lean;Sit to/from stand       Functional mobility during ADLs: Minimal assistance;Rolling walker;Cueing for safety       Vision Baseline Vision/History: Wears glasses Wears Glasses: At all times Patient Visual Report: No change from baseline       Perception     Praxis      Pertinent Vitals/Pain Pain Assessment: Faces Faces Pain Scale: Hurts little more Pain Location: feet Pain Descriptors / Indicators: Tingling;Pins and needles Pain Intervention(s): Limited activity within patient's tolerance;Monitored during session;Repositioned     Hand Dominance     Extremity/Trunk Assessment Upper Extremity Assessment Upper Extremity Assessment: Generalized weakness   Lower Extremity Assessment Lower Extremity Assessment: Defer to PT evaluation       Communication Communication Communication: No difficulties   Cognition Arousal/Alertness: Awake/alert Behavior During Therapy: WFL for tasks assessed/performed Overall Cognitive Status: No family/caregiver present to determine baseline cognitive functioning Area of Impairment: Memory;Safety/judgement                     Memory: Decreased short-term memory   Safety/Judgement: Decreased awareness of safety     General Comments: pt requires cues for STM, safety, and repitition for basic commands   General Comments  Exercises     Shoulder Instructions      Home Living Family/patient expects to be discharged to:: Private residence Living Arrangements: Alone Available Help at Discharge: Neighbor;Available PRN/intermittently Type of Home: House Home Access: Stairs to enter [employer] of Steps: 3 Entrance  Stairs-Rails: Right;Left;Can reach both Home Layout: One level     Bathroom Shower/Tub: Teacher, early years/pre: Handicapped height Bathroom Accessibility: Yes How Accessible: Accessible via walker Home Equipment: Cane - single point;Walker - 2 wheels   Additional Comments: uses cane when necessary but not usually.      Prior Functioning/Environment Level of Independence: Independent with assistive device(s)        Comments: Uses SPC as needed. No falls. Drives. Does IADLs, eats out.        OT Problem List: Decreased strength;Decreased knowledge of use of DME or AE;Decreased activity tolerance;Cardiopulmonary status limiting activity;Impaired balance (sitting and/or standing);Decreased safety awareness      OT Treatment/Interventions: Self-care/ADL training;Therapeutic exercise;Patient/family education;Balance training;Energy conservation;Therapeutic activities;DME and/or AE instruction    OT Goals(Current goals can be found in the care plan section) Acute Rehab OT Goals Patient Stated Goal: to go home OT Goal Formulation: With patient Time For Goal Achievement: 01/14/20 Potential to Achieve Goals: Good  OT Frequency: Min 2X/week   Barriers to D/C:            Co-evaluation              AM-PAC OT "6 Clicks" Daily Activity     Outcome Measure Help from another person eating meals?: A Little Help from another person taking care of personal grooming?: A Little Help from another person toileting, which includes using toliet, bedpan, or urinal?: A Little Help from another person bathing (including washing, rinsing, drying)?: A Lot Help from another person to put on and taking off regular upper body clothing?: A Little Help from another person to put on and taking off regular lower body clothing?: A Lot 6 Click Score: 16   End of Session Equipment Utilized During Treatment: Gait belt;Rolling walker Nurse Communication: Mobility status  Activity  Tolerance: Patient tolerated treatment well Patient left: in bed;with call bell/phone within reach (sitting EOB, RN aware)  OT Visit Diagnosis: Unsteadiness on feet (R26.81);Other abnormalities of gait and mobility (R26.89);Muscle weakness (generalized) (M62.81)                Time: 2878-6767 OT Time Calculation (min): 17 min Charges:  OT General Charges $OT Visit: 1 Visit OT Evaluation $OT Eval Moderate Complexity: [place], MSOT, OTR/L [place] [facility] Office Number: [phone] Pager: [phone]  Donna May 12/31/2019, 2:29 PM

## 2019-12-31 NOTE — Progress Notes (Signed)
   12/31/19 0728  Assess: MEWS Score  Temp (!) 101.6 F (38.7 C)  BP (!) 172/60  Pulse Rate 86  ECG Heart Rate 86  Resp 18  Level of Consciousness Alert  SpO2 97 %  O2 Device Room Air  Patient Activity (if Appropriate) In bed  Assess: MEWS Score  MEWS Temp 2  MEWS Systolic 0  MEWS Pulse 0  MEWS RR 0  MEWS LOC 0  MEWS Score 2  MEWS Score Color Yellow  Assess: if the MEWS score is Yellow or Red  Were vital signs taken at a resting state? Yes  Focused Assessment Change from prior assessment (see assessment flowsheet)  Early Detection of Sepsis Score *See Row Information* Medium  MEWS guidelines implemented *See Row Information* Yes  Treat  MEWS Interventions Escalated (See documentation below)  Pain Scale 0-10 (denies)  Take Vital Signs  Increase Vital Sign Frequency  Yellow: Q 2hr X 2 then Q 4hr X 2, if remains yellow, continue Q 4hrs  Escalate  MEWS: Escalate Yellow: discuss with charge nurse/RN and consider discussing with provider and RRT  Notify: Charge Nurse/RN  Name of Charge Nurse/RN Notified Nona Dell  Date Charge Nurse/RN Notified 12/31/19  Time Charge Nurse/RN Notified 2703  Notify: Provider  Provider Name/Title Swayze  Date Provider Notified 12/31/19  Time Provider Notified 0745  Notification Type Page  Notification Reason Change in status  Response No new orders  Date of Provider Response 12/31/19  Time of Provider Response 0745  Document  Patient Outcome Other (Comment) (Denies Pain, SOB, weakness or other c/o)  Progress note created (see row info) Yes

## 2020-01-01 ENCOUNTER — Other Ambulatory Visit: Payer: Self-pay | Admitting: *Deleted

## 2020-01-01 ENCOUNTER — Inpatient Hospital Stay (HOSPITAL_COMMUNITY): Payer: Medicare Other

## 2020-01-01 DIAGNOSIS — I5033 Acute on chronic diastolic (congestive) heart failure: Secondary | ICD-10-CM | POA: Diagnosis not present

## 2020-01-01 LAB — BASIC METABOLIC PANEL
Anion gap: 10 (ref 5–15)
BUN: 62 mg/dL — ABNORMAL HIGH (ref 8–23)
CO2: 23 mmol/L (ref 22–32)
Calcium: 8.1 mg/dL — ABNORMAL LOW (ref 8.9–10.3)
Chloride: 101 mmol/L (ref 98–111)
Creatinine, Ser: 2.4 mg/dL — ABNORMAL HIGH (ref 0.44–1.00)
GFR, Estimated: 18 mL/min — ABNORMAL LOW (ref >60)
Glucose, Bld: 102 mg/dL — ABNORMAL HIGH (ref 70–99)
Potassium: 3.7 mmol/L (ref 3.5–5.1)
Sodium: 134 mmol/L — ABNORMAL LOW (ref 135–145)

## 2020-01-01 LAB — URINALYSIS, ROUTINE W REFLEX MICROSCOPIC
Bilirubin Urine: NEGATIVE
Glucose, UA: NEGATIVE mg/dL
Ketones, ur: NEGATIVE mg/dL
Leukocytes,Ua: NEGATIVE
Nitrite: NEGATIVE
Protein, ur: NEGATIVE mg/dL
Specific Gravity, Urine: 1.014 (ref 1.005–1.030)
pH: 5 (ref 5.0–8.0)

## 2020-01-01 LAB — GLUCOSE, CAPILLARY
Glucose-Capillary: 106 mg/dL — ABNORMAL HIGH (ref 70–99)
Glucose-Capillary: 129 mg/dL — ABNORMAL HIGH (ref 70–99)
Glucose-Capillary: 111 mg/dL — ABNORMAL HIGH (ref 70–99)
Glucose-Capillary: 126 mg/dL — ABNORMAL HIGH (ref 70–99)

## 2020-01-01 LAB — LEGIONELLA PNEUMOPHILA SEROGP 1 UR AG: L. pneumophila Serogp 1 Ur Ag: NEGATIVE

## 2020-01-01 LAB — CULTURE, RESPIRATORY W GRAM STAIN: Culture: NORMAL

## 2020-01-01 LAB — HEMOGLOBIN A1C
Hgb A1c MFr Bld: 4.9 % (ref 4.8–5.6)
Mean Plasma Glucose: 94 mg/dL

## 2020-01-01 MED ORDER — IOHEXOL 9 MG/ML PO SOLN
500.0000 mL | ORAL | Status: AC
  Administered 2020-01-01 (×2): 500 mL via ORAL

## 2020-01-01 NOTE — TOC Initial Note (Signed)
Transition of Care [facility]) - Initial/Assessment Note    Patient Details  Name: Donna May MRN: [id number] Date of Birth: 22-Oct-1940  [facility] of [facility]) CM/SW Contact:    Ninfa Meeker, RN Phone Number: 01/01/2020, 10:17 AM Acute Clinical Narrative:  Patient is 79 yr old female admitted with Acute/Chronic HF, pulmonary edema. Case  Manager spoke with patient via telephone to discuss Discharge plan, Choice for [facility] agency was offered. Patient politely declined Home Health services for now, stating she is having work done on her home and it is in disarray. She said she lives alone but her family will assist as needed. TOC Team will continue to monitor.            Expected Discharge Plan: [place] Barriers to Discharge: No Barriers Identified   Patient Goals and CMS Choice Patient states their goals for this hospitalization and ongoing recovery are:: go home   Choice offered to / list presented to : Patient  Expected Discharge Plan and Services Expected Discharge Plan: [place] Acute Care Choice: [place] arrangements for the past 2 months: Single Family Home                 DME Arranged: N/A         HH Arranged: Patient Refused [place] Agency: NA        Prior Living Arrangements/Services Living arrangements for the past 2 months: Single Family Home Lives with:: Self Patient language and need for interpreter reviewed:: Yes Do you feel safe going back to the place where you live?: Yes      Need for Family Participation in Patient Care: Yes (Comment) Care giver support system in place?: Yes (comment)   Criminal Activity/Legal Involvement Pertinent to Current Situation/Hospitalization: No - Comment as needed  Activities of Daily Living Home Assistive Devices/Equipment: Blood pressure cuff, Cane (specify quad or straight), Eyeglasses, Shower chair with back, Environmental consultant (specify type) ADL Screening (condition at  time of admission) Patient's cognitive ability adequate to safely complete daily activities?: Yes Is the patient deaf or have difficulty hearing?: No Does the patient have difficulty seeing, even when wearing glasses/contacts?: No Does the patient have difficulty concentrating, remembering, or making decisions?: No Patient able to express need for assistance with ADLs?: Yes Does the patient have difficulty dressing or bathing?: Yes Independently performs ADLs?: Yes (appropriate for developmental age) Does the patient have difficulty walking or climbing stairs?: No Weakness of Legs: Both Weakness of Arms/Hands: None  Permission Sought/Granted                  Emotional Assessment   Attitude/Demeanor/Rapport: Gracious          Admission diagnosis:  SOB (shortness of breath) [R06.02] Acute on chronic diastolic CHF (congestive heart failure) ([place]) [I50.33] Acute on chronic congestive heart failure, unspecified heart failure type ([place]) [I50.9] Acute respiratory failure ([place]) [J96.00] Patient Active Problem List   Diagnosis Date Noted  . Acute respiratory failure ([place]) 12/28/2019  . Hypertensive urgency 12/27/2019  . Acute on chronic diastolic CHF (congestive heart failure) ([place]) 12/27/2019  . Palliative care by specialist   . DNR (do not resuscitate) discussion   . Acute respiratory failure with hypoxia ([place]) 10/18/2019  . Hypertensive emergency 10/18/2019  . Anemia 10/18/2019  . CKD (chronic kidney disease), stage III ([place]) 10/18/2019  . Chemotherapy-induced peripheral neuropathy ([place]) 08/28/2019  . Port-A-Cath in place 12/14/2018  .  Goals of care, counseling/discussion 11/24/2018  . Status post laparoscopy 11/17/2018  . Genetic testing 11/07/2018  . Family history of prostate cancer   . Family history of breast cancer   . Intrahepatic cholangiocarcinoma ([place]) 09/20/2018  . Coronary artery calcification 04/20/2016  . Ductal carcinoma in situ (DCIS) of right breast  11/26/2015  . Lymphedema of arm 11/26/2015  . Lymphedema 11/01/2015  . Leg pain, bilateral 12/28/2013  . Special screening for malignant neoplasms, colon 08/09/2013  . Renal artery stenosis, native, bilateral ([place]) 06/09/2013  . Diabetes mellitus with renal complications ([place]) 99/35/7017  . RASH AND OTHER NONSPECIFIC SKIN ERUPTION 07/05/2008  . Malignant neoplasm of female breast ([place]) 07/04/2008  . HYPERCHOLESTEROLEMIA 07/04/2008  . Essential hypertension 07/04/2008  . GERD 07/04/2008  . DUODENITIS, WITHOUT HEMORRHAGE 07/04/2008  . HIATAL HERNIA 07/04/2008  . Arthropathy 07/04/2008   PCP:  [name], MD Pharmacy:   [id number] [place], [place] [address] [place] [address] Phone: [phone] Fax: [phone]  [facility] [place] [place] [facility] [place] [address] Phone: [phone] Fax: [phone]     Social Determinants of Health (SDOH) Interventions    Readmission Risk Interventions No flowsheet data found.

## 2020-01-01 NOTE — Progress Notes (Signed)
Patient off unit accompanied by transport going to CT. VSS.

## 2020-01-01 NOTE — Progress Notes (Addendum)
Physical Therapy Treatment Patient Details Name: DELANE STALLING MRN: [id number] DOB: 1940/08/31 Today's Date: 01/01/2020    History of Present Illness Patient is a 79 y/o female who presents with SOB, LE edema. Admitted with CHF exacerbation and HTN urgency. CXR-pukmonary edema. PMH includes DM, CKD III, anemia and cholangiocarcinoma.    PT Comments    Pt supine in bed reports feeling wet from sweating as she has been running intermittent fevers.  Pt following commands well and performing all functional mobility with close supervision.  NO supplemental O2 needed this session.  She remains on track to return home when medically ready.  Will require stair training next session.     Follow Up Recommendations  Home health PT;Supervision for mobility/OOB     Equipment Recommendations  None recommended by PT    Recommendations for Other Services       Precautions / Restrictions Precautions Precautions: Fall Restrictions Weight Bearing Restrictions: No    Mobility  Bed Mobility Overal bed mobility: Needs Assistance Bed Mobility: Supine to Sit     Supine to sit: Supervision;HOB elevated     General bed mobility comments: Increased time, use of rail to get to EOB. NO assistance needed.  Transfers Overall transfer level: Needs assistance Equipment used: Rolling walker (2 wheeled) Transfers: Sit to/from Stand Sit to Stand: Supervision         General transfer comment: Cues for hand placement and forward weight shifting.  Ambulation/Gait Ambulation/Gait assistance: Supervision Gait Distance (Feet): 70 Feet Assistive device: Rolling walker (2 wheeled) Gait Pattern/deviations: Step-through pattern;Trunk flexed;Narrow base of support     General Gait Details: Pt with slow mildly unsteady gt training this session.  NO overt LOB and required several standing rest periods during gt training.  SPO2 >90% on RA during gt training.   Stairs             Wheelchair  Mobility    Modified Rankin (Stroke Patients Only)       Balance Overall balance assessment: Needs assistance Sitting-balance support: Feet supported;No upper extremity supported Sitting balance-Leahy Scale: Good       Standing balance-Leahy Scale: Fair                              Cognition Arousal/Alertness: Awake/alert Behavior During Therapy: WFL for tasks assessed/performed Overall Cognitive Status: No family/caregiver present to determine baseline cognitive functioning Area of Impairment: Memory;Safety/judgement                     Memory: Decreased short-term memory   Safety/Judgement: Decreased awareness of safety     General Comments: pt requires cues for STM, safety, and repitition for basic commands      Exercises      General Comments        Pertinent Vitals/Pain Pain Assessment: Faces Faces Pain Scale: Hurts little more Pain Location: feet Pain Descriptors / Indicators: Tingling;Pins and needles Pain Intervention(s): Monitored during session;Repositioned    Home Living                      Prior Function            PT Goals (current goals can now be found in the care plan section) Acute Rehab PT Goals Patient Stated Goal: to go home Potential to Achieve Goals: Good Progress towards PT goals: Progressing toward goals    Frequency    Min 3X/week  PT Plan Current plan remains appropriate    Co-evaluation              AM-PAC PT "6 Clicks" Mobility   Outcome Measure  Help needed turning from your back to your side while in a flat bed without using bedrails?: None Help needed moving from lying on your back to sitting on the side of a flat bed without using bedrails?: None Help needed moving to and from a bed to a chair (including a wheelchair)?: None Help needed standing up from a chair using your arms (e.g., wheelchair or bedside chair)?: None Help needed to walk in hospital room?: None Help  needed climbing 3-5 steps with a railing? : A Little 6 Click Score: 23    End of Session   Activity Tolerance: Patient limited by fatigue Patient left: with call bell/phone within reach;in chair;with chair alarm set Nurse Communication: Mobility status;Other (comment) (bed linen needs to be change and purewick has been removed.  Also SPO2 >90% on RA.) PT Visit Diagnosis: Muscle weakness (generalized) (M62.81);Unsteadiness on feet (R26.81)     Time: 2548-6282 PT Time Calculation (min) (ACUTE ONLY): 24 min  Charges:  $Gait Training: 8-22 mins $Therapeutic Activity: 8-22 mins                     Erasmo Leventhal , PTA Acute Rehabilitation Services Pager [phone] Office [place] Sheryl[place] 01/01/2020, 1:15 PM

## 2020-01-01 NOTE — Progress Notes (Signed)
Report given to Sullivan Lone), RN. Patient is alert and oriented x4 resting in bed with call light in reach. 1L of O2 in place via nasal cannula. Purewick in place. PO Lasix in progress.  Fevers noted overnight and treated with Ibuprofen and Tylenol.  Possible DC home today with home PT. K+ 3.7 and Creat 2.40 this AM.

## 2020-01-01 NOTE — Consult Note (Addendum)
Donna May January 18, 1941  [id number].    Requesting MD: Dr. Benny Lennert Chief Complaint/Reason for Consult: Fever, possible port removal  HPI: Donna May is a 79 y.o. HTN, HLD, DM2, Cholangiocarcinoma on Chemotherapy (last dose 10/13 per patient) followed by Dr. Burr Medico (port placed by Dr. Barry Dienes 12/02/2018).   Patient initially presented to [facility] with shortness of breath on 10/13.  Found to be in CHF exacerbation with pulmonary edema and BNP greater than 2000.  Was treated with diuresis with some improvement.  Patient began running fevers on 10/15.  She has had a daily fever since that time.  She has had blood cultures done with no growth to date.  Respiratory cultures with normal flora.  Chest x-ray with no focal abnormalities on 10/15.  ID was consulted, Dr. Drucilla Schmidt who recommended removal of port.  We were asked to see.  Primary team is also obtaining a UA as well as a CT chest, abdomen and pelvis to rule out other etiologies.  Patient reports some mild shortness of breath and a productive cough with green/brown phlegm.  She denies any chest pain, abdominal pain, nausea, vomiting, diarrhea.  She is tolerating a diet.  She does note constipation with her last bowel movement on 10/13. Last PET on 10/4 showed no significant abnormal activity to suggest active/recurrent malignancy.  Dr. Burr Medico had discussed trial off chemotherapy with patient, however patient wished to continue with chemo.   ROS: Review of Systems  Constitutional: Positive for fever.  Respiratory: Positive for cough. Negative for shortness of breath.   Cardiovascular: Negative for chest pain.  Gastrointestinal: Positive for constipation. Negative for abdominal pain, diarrhea, nausea and vomiting.  Genitourinary: Negative for dysuria, flank pain, frequency, hematuria and urgency.  Musculoskeletal: Negative for back pain.  Skin: Negative for rash.  Neurological: Negative for headaches.  Psychiatric/Behavioral: Negative for substance  abuse.  All other systems reviewed and are negative.   Family History  Problem Relation Age of Onset  . Breast cancer Cousin        diagnosed >50; mother's first cousins  . Diabetes Brother   . Heart disease Mother   . Hypertension Mother   . Heart disease Father   . Hypertension Father   . Prostate cancer Brother 74  . Heart attack Maternal Grandmother   . Stroke Maternal Grandfather   . Colon cancer Neg Hx   . Esophageal cancer Neg Hx   . Stomach cancer Neg Hx   . Rectal cancer Neg Hx     Past Medical History:  Diagnosis Date  . Anemia   . Anxiety   . Arthritis   . Breast cancer ([place]) 1992  . Cataract    Bilateral eyes - surgery to remove  . Chronic kidney disease    CKD stage 3  . Complication of anesthesia    "something they use make me itch for a couple of days."  . Depression   . Diabetes mellitus    type 2  . Duodenitis 01/18/2002  . Fainting spell   . Family history of breast cancer   . Family history of prostate cancer   . GERD (gastroesophageal reflux disease)   . Heart murmur    never has caused any problems  . Hiatal hernia 08/08/2008, 01/18/2002  . History of pneumonia    x 2  . Hyperlipidemia   . Hypertension   . Liver cancer ([place]) 08/2018  . Lymphedema 2017   Right arm  . Pneumonia  x 2  . UTI (lower urinary tract infection)     Past Surgical History:  Procedure Laterality Date  . AXILLARY SURGERY     cyst removal, right  . COLONOSCOPY  09/23/2018   Dr. Havery Moros - polyps  . EYE SURGERY Bilateral    cataracts to remove  . LAPAROSCOPY N/A 11/17/2018   Procedure: LAPAROSCOPY DIAGNOSTIC, INTRAOPERATIVE ULTRASOUND, PERITONEAL BIOPSIES;  Surgeon: [name], MD;  Location: [facility];  Service: General;  Laterality: N/A;  GENERAL AND EPIDURAL  . LIVER BIOPSY  08/2018   Dr. Lindwood Coke  . MASTECTOMY  1992   right, with flap  . NM MYOCAR PERF WALL MOTION  06/11/2009   Protocol:[place], post stress EF58%, EKG negative for ischemia, low risk  .  PORTACATH PLACEMENT N/A 12/02/2018   Procedure: INSERTION PORT-A-CATH;  Surgeon: [name], MD;  Location: [facility];  Service: General;  Laterality: N/A;  . RECONSTRUCTION BREAST W/ TRAM FLAP Right   . TONSILLECTOMY    . TRANSTHORACIC ECHOCARDIOGRAM  12/24/2009   LVEF =>55%, normal study  . UPPER GI ENDOSCOPY      Social History:  reports that she quit smoking about 31 years ago. Her smoking use included cigarettes. She has a 6.25 pack-year smoking history. She has never used smokeless tobacco. She reports previous alcohol use of about 1.0 standard drink of alcohol per week. She reports that she does not use drugs.  Allergies: No Known Allergies  Facility-Administered Medications Prior to Admission  Medication Dose Route Frequency Provider Last Rate Last Admin  . triamcinolone acetonide (KENALOG) 10 MG/ML injection 10 mg  10 mg Other Once Harriet Masson, DPM       Medications Prior to Admission  Medication Sig Dispense Refill  . amLODipine (NORVASC) 10 MG tablet Take 1 tablet (10 mg total) by mouth daily. 90 tablet 3  . aspirin 81 MG tablet Take 81 mg by mouth daily.     . furosemide (LASIX) 40 MG tablet Take 1 tablet (40 mg total) by mouth 2 (two) times a week. (Patient taking differently: Take 40 mg by mouth See admin instructions. 1 tablet (40mg )  on Tuesday, Thursdays, Fridays, Saturday and Sunday.) 10 tablet 2  . glucose blood test strip 1 each by Other route 2 (two) times daily. Use Onetouch verio test strips as instructed to check blood sugar twice daily. 100 each 2  . hydrochlorothiazide (MICROZIDE) 12.5 MG capsule Take 1 capsule (12.5 mg total) by mouth daily. 90 capsule 1  . lansoprazole (PREVACID) 15 MG capsule Take 1 capsule (15 mg total) by mouth daily. 90 capsule 1  . loratadine (CLARITIN) 10 MG tablet Take 10 mg by mouth daily as needed for allergies.    . metoprolol succinate (TOPROL-XL) 100 MG 24 hr tablet Take 1 tablet (100 mg total) by mouth daily. Take with or  immediately following a meal. 90 tablet 1  . Omega 3 1000 MG CAPS Take 2,000 mg by mouth daily.     . potassium chloride (KLOR-CON) 10 MEQ tablet Take 1 tablet (10 mEq total) by mouth 2 (two) times a week. (Patient taking differently: Take 10 mEq by mouth daily. 1 tablet (82mEq)  on Tuesday, Thursdays, Fridays, Saturday and Sunday.) 10 tablet 2  . sitaGLIPtin (JANUVIA) 100 MG tablet Take 1 tablet (100 mg total) by mouth daily. 90 tablet 4  . valsartan (DIOVAN) 320 MG tablet Take 1 tablet (320 mg total) by mouth daily. 90 tablet 3  . lidocaine-prilocaine (EMLA) cream Apply to affected area  once (Patient not taking: Reported on 12/28/2019) 90 g 1     Physical Exam: Blood pressure 127/68, pulse 65, temperature 97.7 F (36.5 C), temperature source Oral, resp. rate 15, height 5\' 5"  (1.651 m), weight 57.6 kg, SpO2 95 %. General: pleasant, WD/WN AA female who is laying in bed in NAD HEENT: head is normocephalic, atraumatic.  Sclera are noninjected.  PERRL.  Ears and nose without any masses or lesions.  Mouth is pink and moist. Dentition fair Heart: regular, rate, and rhythm.  Normal s1,s2. No obvious murmurs, gallops, or rubs noted.  Palpable radial and pedal pulses bilaterally  Lungs: CTAB, no wheezes, rhonchi, or rales noted.  Respiratory effort non-labored.  Chest wall: Port in place. No skin erythema, heat, induration, fluctuance or drainage. No signs of infection. Abd: Soft, NT/ND, +BS, no masses, hernias, or organomegaly MS: no BUE/BLE edema, calves soft and nontender Skin: warm and dry with no masses, lesions, or rashes Psych: A&Ox4 with an appropriate affect Neuro: cranial nerves grossly intact, equal strength in BUE/BLE bilaterally, normal speech, though process intact  Results for orders placed or performed during the hospital encounter of 12/27/19 (from the past 48 hour(s))  Glucose, capillary     Status: None   Collection Time: 12/30/19  5:19 PM  Result Value Ref Range    Glucose-Capillary 91 70 - 99 mg/dL    Comment: Glucose reference range applies only to samples taken after fasting for at least 8 hours.  Glucose, capillary     Status: None   Collection Time: 12/30/19  9:31 PM  Result Value Ref Range   Glucose-Capillary 96 70 - 99 mg/dL    Comment: Glucose reference range applies only to samples taken after fasting for at least 8 hours.  Basic metabolic panel     Status: Abnormal   Collection Time: 12/31/19  1:55 AM  Result Value Ref Range   Sodium 138 135 - 145 mmol/L   Potassium 4.2 3.5 - 5.1 mmol/L   Chloride 104 98 - 111 mmol/L   CO2 21 (L) 22 - 32 mmol/L   Glucose, Bld 95 70 - 99 mg/dL    Comment: Glucose reference range applies only to samples taken after fasting for at least 8 hours.   BUN 54 (H) 8 - 23 mg/dL   Creatinine, Ser 2.19 (H) 0.44 - 1.00 mg/dL   Calcium 8.3 (L) 8.9 - 10.3 mg/dL   GFR, Estimated 21 (L) >60 mL/min   Anion gap 13 5 - 15    Comment: Performed at [facility] [address] [place], [place] [address]  Glucose, capillary     Status: None   Collection Time: 12/31/19  7:30 AM  Result Value Ref Range   Glucose-Capillary 78 70 - 99 mg/dL    Comment: Glucose reference range applies only to samples taken after fasting for at least 8 hours.  Glucose, capillary     Status: Abnormal   Collection Time: 12/31/19 11:24 AM  Result Value Ref Range   Glucose-Capillary 199 (H) 70 - 99 mg/dL    Comment: Glucose reference range applies only to samples taken after fasting for at least 8 hours.  Glucose, capillary     Status: Abnormal   Collection Time: 12/31/19  4:45 PM  Result Value Ref Range   Glucose-Capillary 144 (H) 70 - 99 mg/dL    Comment: Glucose reference range applies only to samples taken after fasting for at least 8 hours.  Glucose, capillary  Status: Abnormal   Collection Time: 12/31/19  8:38 PM  Result Value Ref Range   Glucose-Capillary 136 (H) 70 - 99 mg/dL    Comment: Glucose reference range applies only  to samples taken after fasting for at least 8 hours.  Basic metabolic panel     Status: Abnormal   Collection Time: 01/01/20  3:27 AM  Result Value Ref Range   Sodium 134 (L) 135 - 145 mmol/L   Potassium 3.7 3.5 - 5.1 mmol/L   Chloride 101 98 - 111 mmol/L   CO2 23 22 - 32 mmol/L   Glucose, Bld 102 (H) 70 - 99 mg/dL    Comment: Glucose reference range applies only to samples taken after fasting for at least 8 hours.   BUN 62 (H) 8 - 23 mg/dL   Creatinine, Ser 2.40 (H) 0.44 - 1.00 mg/dL   Calcium 8.1 (L) 8.9 - 10.3 mg/dL   GFR, Estimated 18 (L) >60 mL/min   Anion gap 10 5 - 15    Comment: Performed at [facility] [address], [place], [place] [address]  Glucose, capillary     Status: Abnormal   Collection Time: 01/01/20  8:44 AM  Result Value Ref Range   Glucose-Capillary 106 (H) 70 - 99 mg/dL    Comment: Glucose reference range applies only to samples taken after fasting for at least 8 hours.  Glucose, capillary     Status: Abnormal   Collection Time: 01/01/20 12:01 PM  Result Value Ref Range   Glucose-Capillary 129 (H) 70 - 99 mg/dL    Comment: Glucose reference range applies only to samples taken after fasting for at least 8 hours.   No results found.    Assessment/Plan HTN HLD DM2 CHF - admitted for excaberation 10/13 - Per [facility] -  Fever of unknown etiology Hx of Cholangiocarcinoma on Chemotherapy (last dose 10/13 per patient) followed by Dr. Burr Medico  S/p port placement by Dr. Barry Dienes 12/02/2018  Given patient is actively undergoing chemotherapy, w/ negative blood cx's and no evidence of infection on exam, would hold off on removal of port until other etiologies of fever are ruled out. Patient awaiting UA, CT chest, abdomen and pelvis. Patients last PET on 10/4 showed no significant abnormal activity to suggest active/recurrent malignancy.  Dr. Burr Medico had discussed trial off chemotherapy with patient, however patient wished to continue with chemo. May be worth involving  Oncology in discussion if patient does need port removed since they were already considering doing a trial of stopping chemo.   FEN - HH VTE - Scds, lovenox  ID - none   Jillyn Ledger, [facility] 01/01/2020, 3:42 PM Please see Amion for pager number during day hours 7:00am-4:30pm

## 2020-01-01 NOTE — Progress Notes (Signed)
PROGRESS NOTE  Donna May ODQ:[id number] DOB: 21-May-1940 DOA: 12/27/2019 PCP: Deeann Saint, MD  Brief History   The patient is a 79 yr old woman who presented to [facility] ED on 12/27/2019 with complaints of shortness of breath. She had had chemotherapy for cholangiocarcinoma earlier in the day. She also had increased lower extremity edema. She has chronic edema of the right arm due to prior mastectomy for BRCA in the 1990's.  In the ED her CXR demonstrated pulmonary edema and she had a BNP of 2301.  [facility] had been consulted to admit the patient for further evaluation and care.  The patient had fevers of 100, 101, 100.4, 101.3 on 12/29/2019. Then she had fevers of 101.6 and102.8 on the evening of 12/30/2019, 101.6 and 100.8 on the morning of 12/31/2019, and 101.2 on the morning of 01/01/2020. No source of infection has been identified. CXR was re-evaluated and demonstrated clearing of the pulmonary edema. It demonstrated no new acute abnormalities. CT chest, abdomen, and pelvis has been ordered. UA has also been ordered, although the patient has had no complaints of dysuria. I have discussed the patient with Dr. Daiva Eves of ID. He has recommended CT chest abdomen and pelvis as above. I have also discussed the patient with general surgery as ID has recommended that she have her port removed.  The patient has been evaluated by PT/OT. They have recommended discharge to home with home health PT/OT.  Consultants  . Infectious Disease . General Surgery  Procedures  . None  Antibiotics   Anti-infectives (From admission, onward)   Start     Dose/Rate Route Frequency Ordered Stop   12/29/19 1915  cefTRIAXone (ROCEPHIN) 1 g in sodium chloride 0.9 % 100 mL IVPB  Status:  Discontinued        1 g 200 mL/hr over 30 Minutes Intravenous Every 24 hours 12/29/19 1820 12/30/19 1741   12/29/19 1915  azithromycin (ZITHROMAX) 500 mg in sodium chloride 0.9 % 250 mL IVPB  Status:  Discontinued         500 mg 250 mL/hr over 60 Minutes Intravenous Every 24 hours 12/29/19 1820 12/30/19 1741   12/29/19 1915  cefTRIAXone (ROCEPHIN) 2 g in sodium chloride 0.9 % 100 mL IVPB  Status:  Discontinued        2 g 200 mL/hr over 30 Minutes Intravenous Every 24 hours 12/29/19 1827 12/29/19 1833   12/29/19 1915  azithromycin (ZITHROMAX) 500 mg in sodium chloride 0.9 % 250 mL IVPB  Status:  Discontinued        500 mg 250 mL/hr over 60 Minutes Intravenous Every 24 hours 12/29/19 1827 12/29/19 1833     Subjective  The patient is resting comfortably. No new complaints.  Objective   Vitals:  Vitals:   01/01/20 0900 01/01/20 1126  BP:  (!) 121/57  Pulse:  65  Resp:  16  Temp:  98.2 F (36.8 C)  SpO2: 95% 95%   Exam:  Constitutional:  . The patient is awake, alert, and oriented x 3. No acute distress. Marland Kitchen  Respiratory:  . No increased work of breathing. . No wheezes or rhonchi . Rales in right mid-lung region. . No tactile fremitus Cardiovascular:  . Regular rate and rhythm . No murmurs, ectopy, or gallups. . No lateral PMI. No thrills. Abdomen:  . Abdomen is soft, non-tender, non-distended . No hernias, masses, or organomegaly . Normoactive bowel sounds.  Musculoskeletal:  . No cyanosis, clubbing, or edema Skin:  .  No rashes, lesions, ulcers . palpation of skin: no induration or nodules Neurologic:  . CN 2-12 intact . Sensation all 4 extremities intact Psychiatric:  . Mental status o Mood, affect appropriate o Orientation to person, place, time  . judgment and insight appear intact  I have personally reviewed the following:   Today's Data  . Vitals, BMP  Imaging  . CXR: Pulmonary edema vs infiltrate  Cardiology Data  . BNP 2301 . EKG - unchanged, LVH gives appearance of elevated ST segments. . Troponins - elevated at 28. Trend. No EKG changes indicating ischemia.  Scheduled Meds: . amLODipine  10 mg Oral Daily  . enoxaparin (LOVENOX) injection  30 mg  Subcutaneous Q24H  . furosemide  40 mg Oral Daily  . hydrALAZINE  50 mg Oral Q8H  . insulin aspart  0-15 Units Subcutaneous TID WC  . irbesartan  300 mg Oral Daily  . loratadine  10 mg Oral Daily  . metoprolol succinate  100 mg Oral Daily  . sodium chloride flush  3 mL Intravenous Q12H   Continuous Infusions: . sodium chloride      Principal Problem:   Acute on chronic diastolic CHF (congestive heart failure) (HCC) Active Problems:   Diabetes mellitus with renal complications (HCC)   Essential hypertension   Intrahepatic cholangiocarcinoma (HCC)   Acute respiratory failure with hypoxia (HCC)   CKD (chronic kidney disease), stage III (HCC)   Hypertensive urgency   Acute respiratory failure (HCC)   LOS: 4 days   A & P  Acute on chronic diastolic CHF / HTN urgency: Resolving. The patient has been admitted to the CHF pathway. She is being diuresed with lasix 40 mg daily. She has been continued on home antihypertensives (avapro, HCTZ, and metoprolol). She will be monitored on telemetry. Creatinine, electrolytes and volume status will be closely monitored.   Febrile illness: Concern for pneumonia instead of pulmonary edema. I started the patient on rocephin and azithromycin. Repeat CXR negative for infiltrates. Will stop IV antibiotics as no source of infection has been identified. Respiratory panel is negative. Blood cultures x 2 gave had no growth. The patient had fevers of 100, 101, 100.4, 101.3 on 12/29/2019. Then she had fevers of 101.6 and102.8 on the evening of 12/30/2019, 101.6 and 100.8 on the morning of 12/31/2019, and 101.2 on the morning of 01/01/2020. No source of infection has been identified. CXR was re-evaluated and demonstrated clearing of the pulmonary edema. It demonstrated no new acute abnormalities. CT chest, abdomen, and pelvis has been ordered. UA has also been ordered, although the patient has had no complaints of dysuria. I have discussed the patient with Dr. Tommy Medal  of ID. He has recommended CT chest abdomen and pelvis as above. I have also discussed the patient with general surgery as ID has recommended that she have her port removed. Per ID fevers may be due to cholangiocarcinoma alone.  Cholangiocarcinoma: Cholangiocarcinoma has shown promising response to therapy. On 10/6 Dr. Krista Blue recd stopping chemo for the time being since they dont see any hypermetabolic lesions on PET scan.  Pt worried about progression of cancer so wanted to continue chemo.  HTN: The patient has been continued on home avapro, HCTZ, and metoprolol, but remains hypertensive. Norvasc was added with minimal improvement.  Unable to increase metoprolol due to marginal heart rate. I will add hydralazine scheduled to improve BPcontrol.  Acute Kidney Injury on CKD stage 4: Creatinine of 1.49 is baseline. This morning it is up to 1.99  with diuresis. Monitor closely. Avoid nephrotoxic medications and hypotension. Monitor creatinine, electrolytes, and volume status.  DMII: At home the patient's glucose is managed with Januvia. Here her glucoses are monitored by FSBS and SSI. Glucoses for the last 24 hours are 83-157.  Debility: The patient was evaluated by PT/OT for disposition recommendations. They have recommended home with home health PT/OT.  I have seen and examined this patient myself. I have spent 34 minutes in her evaluation and care.  DVT prophylaxis: Lovenox Code Status: Patient wants her code status changed back to full code at this time she says. Family Communication: No family in room Disposition Plan: Home after breathing improved  Jennavieve Arrick, DO [facility] Direct contact: see [email or web address]  7PM-7AM contact night coverage as above 01/01/2020, 3:32 PM  LOS: 0 days

## 2020-01-01 NOTE — Progress Notes (Signed)
Patient back on unit from CT. No distress noted. Bed is locked and in lowest position with call light in reach.

## 2020-01-02 DIAGNOSIS — N1832 Chronic kidney disease, stage 3b: Secondary | ICD-10-CM | POA: Diagnosis not present

## 2020-01-02 DIAGNOSIS — I509 Heart failure, unspecified: Secondary | ICD-10-CM

## 2020-01-02 DIAGNOSIS — J9601 Acute respiratory failure with hypoxia: Secondary | ICD-10-CM | POA: Diagnosis not present

## 2020-01-02 DIAGNOSIS — J189 Pneumonia, unspecified organism: Secondary | ICD-10-CM | POA: Diagnosis not present

## 2020-01-02 DIAGNOSIS — I5033 Acute on chronic diastolic (congestive) heart failure: Secondary | ICD-10-CM | POA: Diagnosis not present

## 2020-01-02 LAB — GLUCOSE, CAPILLARY
Glucose-Capillary: 84 mg/dL (ref 70–99)
Glucose-Capillary: 143 mg/dL — ABNORMAL HIGH (ref 70–99)
Glucose-Capillary: 109 mg/dL — ABNORMAL HIGH (ref 70–99)
Glucose-Capillary: 90 mg/dL (ref 70–99)

## 2020-01-02 NOTE — Care Management Important Message (Signed)
Important Message  Patient Details  Name: TRUDA STAUB MRN: [id number] Date of Birth: 01-21-1941   Medicare Important Message Given:  Yes     Shelda Altes 01/02/2020, 10:55 AM

## 2020-01-02 NOTE — Consult Note (Signed)
Date of Admission:  12/27/2019          Reason for Consult: FUO    Referring Provider: Dr. Benny Lennert   Assessment:  1. Fever of unknown origin 2. Cholangiocarcinoma with response to chemotherapy 3. Admission with pulmonary edema is resolving 4. Peripheral neuropathy due to chemotherapy 5. Chronic kidney disease  Plan:  1. We will obtain echocardiogram I would favor removing her port in case this is a nidus of infection given ongoing fevers with temp on the 16th as high as 102.8  Principal Problem:   Acute on chronic diastolic CHF (congestive heart failure) (HCC) Active Problems:   Diabetes mellitus with renal complications (HCC)   Essential hypertension   Intrahepatic cholangiocarcinoma (HCC)   Acute respiratory failure with hypoxia (HCC)   CKD (chronic kidney disease), stage III (HCC)   Hypertensive urgency   Acute respiratory failure (HCC)   Scheduled Meds: . amLODipine  10 mg Oral Daily  . enoxaparin (LOVENOX) injection  30 mg Subcutaneous Q24H  . furosemide  40 mg Oral Daily  . hydrALAZINE  50 mg Oral Q8H  . insulin aspart  0-15 Units Subcutaneous TID WC  . irbesartan  300 mg Oral Daily  . loratadine  10 mg Oral Daily  . metoprolol succinate  100 mg Oral Daily  . sodium chloride flush  3 mL Intravenous Q12H   Continuous Infusions: . sodium chloride     PRN Meds:.sodium chloride, acetaminophen, ondansetron (ZOFRAN) IV, sodium chloride, sodium chloride flush  HPI: Donna May is a 79 y.o. female with cholangiocarcinoma on chemotherapy who was admitted on 13 October with shortness of breath and pulmonary edema.  This it happened after she had had chemotherapy.  She was admitted to hospitalist service and successfully diuresed with resolution of her pulmonary symptoms.  She then began having fevers that got as high as 102.8 on the 16th.  Her blood cultures have been sterile.  Her chest x-ray was negative.  CT chest abdomen pelvis showed some atelectasis at  the bases versus pneumonia (though I am skeptical of the latter being the cause of her high fevers), decrease in size of segment 4 hepatic lesion.  While her port is conceivably not infected I do not see a clear-cut explanation for her high fevers.  If the port is infected and she proceeds with chemotherapy this could be disastrous with ensuing bacteremia and sepsis.  Once the port is out and if she becomes afebrile one could then place PICC for her to resume chemotherapy.      Review of Systems: Review of Systems  Constitutional: Negative for chills, fever, malaise/fatigue and weight loss.  HENT: Negative for congestion and sore throat.   Eyes: Negative for blurred vision and photophobia.  Respiratory: Positive for shortness of breath. Negative for cough and wheezing.   Cardiovascular: Negative for chest pain, palpitations and leg swelling.  Gastrointestinal: Negative for abdominal pain, blood in stool, constipation, diarrhea, heartburn, melena, nausea and vomiting.  Genitourinary: Negative for dysuria, flank pain and hematuria.  Musculoskeletal: Negative for back pain, falls, joint pain and myalgias.  Skin: Negative for itching and rash.  Neurological: Negative for dizziness, focal weakness, loss of consciousness, weakness and headaches.  Endo/Heme/Allergies: Does not bruise/bleed easily.  Psychiatric/Behavioral: Negative for depression and suicidal ideas. The patient does not have insomnia.    Port is not overtly erythematous or fluctuant. Past Medical History:  Diagnosis Date  . Anemia   . Anxiety   . Arthritis   .  Breast cancer ([place]) 1992  . Cataract    Bilateral eyes - surgery to remove  . Chronic kidney disease    CKD stage 3  . Complication of anesthesia    "something they use make me itch for a couple of days."  . Depression   . Diabetes mellitus    type 2  . Duodenitis 01/18/2002  . Fainting spell   . Family history of breast cancer   . Family history of  prostate cancer   . GERD (gastroesophageal reflux disease)   . Heart murmur    never has caused any problems  . Hiatal hernia 08/08/2008, 01/18/2002  . History of pneumonia    x 2  . Hyperlipidemia   . Hypertension   . Liver cancer ([place]) 08/2018  . Lymphedema 2017   Right arm  . Pneumonia    x 2  . UTI (lower urinary tract infection)     Social History   Tobacco Use  . Smoking status: Former Smoker    Packs/day: 0.25    Years: 25.00    Pack years: 6.25    Types: Cigarettes    Quit date: 1990    Years since quitting: 31.8  . Smokeless tobacco: Never Used  Vaping Use  . Vaping Use: Never used  Substance Use Topics  . Alcohol use: Not Currently    Alcohol/week: 1.0 standard drink    Types: 1 Glasses of wine per week    Comment: occasional wine  . Drug use: No    Family History  Problem Relation Age of Onset  . Breast cancer Cousin        diagnosed >50; mother's first cousins  . Diabetes Brother   . Heart disease Mother   . Hypertension Mother   . Heart disease Father   . Hypertension Father   . Prostate cancer Brother 61  . Heart attack Maternal Grandmother   . Stroke Maternal Grandfather   . Colon cancer Neg Hx   . Esophageal cancer Neg Hx   . Stomach cancer Neg Hx   . Rectal cancer Neg Hx    No Known Allergies  OBJECTIVE: Blood pressure (!) 138/58, pulse 67, temperature 98.3 F (36.8 C), temperature source Oral, resp. rate 16, height 5\' 5"  (1.651 m), weight 58.1 kg, SpO2 93 %.  Physical Exam Constitutional:      General: She is not in acute distress.    Appearance: Normal appearance. She is well-developed. She is not ill-appearing.  HENT:     Head: Normocephalic and atraumatic.     Right Ear: Hearing and external ear normal.     Left Ear: Hearing and external ear normal.     Nose: No nasal deformity or rhinorrhea.  Eyes:     General: No scleral icterus.    Conjunctiva/sclera: Conjunctivae normal.     Right eye: Right conjunctiva is not injected.       Left eye: Left conjunctiva is not injected.     Pupils: Pupils are equal, round, and reactive to light.  Neck:     Vascular: No JVD.  Cardiovascular:     Rate and Rhythm: Normal rate and regular rhythm.     Heart sounds: Normal heart sounds, S1 normal and S2 normal. No murmur heard.  No friction rub.  Pulmonary:     Effort: Pulmonary effort is normal. No respiratory distress.     Breath sounds: No stridor. No wheezing.  Abdominal:     General: Bowel sounds are normal.  There is no distension.     Palpations: Abdomen is soft.     Tenderness: There is no abdominal tenderness.  Musculoskeletal:        General: Normal range of motion.     Right shoulder: Normal.     Left shoulder: Normal.     Cervical back: Normal range of motion and neck supple.     Right hip: Normal.     Left hip: Normal.     Right knee: Normal.     Left knee: Normal.  Lymphadenopathy:     Head:     Right side of head: No submandibular, preauricular or posterior auricular adenopathy.     Left side of head: No submandibular, preauricular or posterior auricular adenopathy.     Cervical: No cervical adenopathy.     Right cervical: No superficial or deep cervical adenopathy.    Left cervical: No superficial or deep cervical adenopathy.  Skin:    General: Skin is warm and dry.     Coloration: Skin is not pale.     Findings: No abrasion, bruising, ecchymosis, erythema, lesion or rash.     Nails: There is no clubbing.  Neurological:     General: No focal deficit present.     Mental Status: She is alert and oriented to person, place, and time.     Sensory: No sensory deficit.     Coordination: Coordination normal.     Gait: Gait normal.  Psychiatric:        Attention and Perception: She is attentive.        Mood and Affect: Mood is anxious.        Speech: Speech normal.        Behavior: Behavior normal. Behavior is cooperative.        Thought Content: Thought content normal.        Judgment: Judgment normal.      Lab Results Lab Results  Component Value Date   WBC 8.5 12/29/2019   HGB 9.5 (L) 12/29/2019   HCT 29.4 (L) 12/29/2019   MCV 97.0 12/29/2019   PLT 160 12/29/2019    Lab Results  Component Value Date   CREATININE 2.40 (H) 01/01/2020   BUN 62 (H) 01/01/2020   NA 134 (L) 01/01/2020   K 3.7 01/01/2020   CL 101 01/01/2020   CO2 23 01/01/2020    Lab Results  Component Value Date   ALT 8 12/27/2019   AST 25 12/27/2019   ALKPHOS 54 12/27/2019   BILITOT 0.6 12/27/2019     Microbiology: Recent Results (from the past 240 hour(s))  Respiratory Panel by RT PCR (Flu A&B, Covid) - Nasopharyngeal Swab     Status: None   Collection Time: 12/27/19  8:50 PM   Specimen: Nasopharyngeal Swab  Result Value Ref Range Status   SARS Coronavirus 2 by RT PCR NEGATIVE NEGATIVE Final    Comment: (NOTE) SARS-CoV-2 target nucleic acids are NOT DETECTED.  The SARS-CoV-2 RNA is generally detectable in upper respiratoy specimens during the acute phase of infection. The lowest concentration of SARS-CoV-2 viral copies this assay can detect is 131 copies/mL. A negative result does not preclude SARS-Cov-2 infection and should not be used as the sole basis for treatment or other patient management decisions. A negative result may occur with  improper specimen collection/handling, submission of specimen other than nasopharyngeal swab, presence of viral mutation(s) within the areas targeted by this assay, and inadequate number of viral copies (<131 copies/mL). A negative result  must be combined with clinical observations, patient history, and epidemiological information. The expected result is Negative.  Fact Sheet for Patients:  PinkCheek.be  Fact Sheet for Healthcare Providers:  GravelBags.it  This test is no t yet approved or cleared by the [place] FDA and  has been authorized for detection and/or diagnosis of SARS-CoV-2 by FDA  under an Emergency Use Authorization (EUA). This EUA will remain  in effect (meaning this test can be used) for the duration of the COVID-19 declaration under Section 564(b)(1) of the Act, 21 U.S.C. section 360bbb-3(b)(1), unless the authorization is terminated or revoked sooner.     Influenza A by PCR NEGATIVE NEGATIVE Final   Influenza B by PCR NEGATIVE NEGATIVE Final    Comment: (NOTE) The Xpert Xpress SARS-CoV-2/FLU/RSV assay is intended as an aid in  the diagnosis of influenza from Nasopharyngeal swab specimens and  should not be used as a sole basis for treatment. Nasal washings and  aspirates are unacceptable for Xpert Xpress SARS-CoV-2/FLU/RSV  testing.  Fact Sheet for Patients: PinkCheek.be  Fact Sheet for Healthcare Providers: GravelBags.it  This test is not yet approved or cleared by the [place] FDA and  has been authorized for detection and/or diagnosis of SARS-CoV-2 by  FDA under an Emergency Use Authorization (EUA). This EUA will remain  in effect (meaning this test can be used) for the duration of the  Covid-19 declaration under Section 564(b)(1) of the Act, 21  U.S.C. section 360bbb-3(b)(1), unless the authorization is  terminated or revoked. Performed at [facility], [place] [address] [place], [place] [address]   Culture, blood (routine x 2)     Status: None (Preliminary result)   Collection Time: 12/29/19  8:29 AM   Specimen: BLOOD LEFT HAND  Result Value Ref Range Status   Specimen Description BLOOD LEFT HAND  Final   Special Requests   Final    BOTTLES DRAWN AEROBIC ONLY Blood Culture adequate volume   Culture   Final    NO GROWTH 4 DAYS Performed at [facility], [place] [address], [place], [place] [address]    Report Status PENDING  Incomplete  Culture, blood (routine x 2)     Status: None (Preliminary result)   Collection Time: 12/29/19 10:22 AM   Specimen: BLOOD LEFT HAND    Result Value Ref Range Status   Specimen Description BLOOD LEFT HAND  Final   Special Requests   Final    BOTTLES DRAWN AEROBIC ONLY Blood Culture adequate volume   Culture   Final    NO GROWTH 4 DAYS Performed at [facility], [address], [place], [place] [address]    Report Status PENDING  Incomplete  Respiratory Panel by PCR     Status: None   Collection Time: 12/29/19  9:36 PM   Specimen: Nasopharyngeal Swab; Respiratory  Result Value Ref Range Status   Adenovirus NOT DETECTED NOT DETECTED Final   Coronavirus 229E NOT DETECTED NOT DETECTED Final    Comment: (NOTE) The Coronavirus on the Respiratory Panel, DOES NOT test for the novel  Coronavirus (2019 nCoV)    Coronavirus HKU1 NOT DETECTED NOT DETECTED Final   Coronavirus NL63 NOT DETECTED NOT DETECTED Final   Coronavirus OC43 NOT DETECTED NOT DETECTED Final   Metapneumovirus NOT DETECTED NOT DETECTED Final   Rhinovirus / Enterovirus NOT DETECTED NOT DETECTED Final   Influenza A NOT DETECTED NOT DETECTED Final   Influenza B NOT DETECTED NOT DETECTED Final  Parainfluenza Virus 1 NOT DETECTED NOT DETECTED Final   Parainfluenza Virus 2 NOT DETECTED NOT DETECTED Final   Parainfluenza Virus 3 NOT DETECTED NOT DETECTED Final   Parainfluenza Virus 4 NOT DETECTED NOT DETECTED Final   Respiratory Syncytial Virus NOT DETECTED NOT DETECTED Final   Bordetella pertussis NOT DETECTED NOT DETECTED Final   Chlamydophila pneumoniae NOT DETECTED NOT DETECTED Final   Mycoplasma pneumoniae NOT DETECTED NOT DETECTED Final    Comment: Performed at [facility], [place] [address], [place], [place] [address]  Sputum culture     Status: None   Collection Time: 12/30/19  3:52 AM   Specimen: Expectorated Sputum  Result Value Ref Range Status   Specimen Description EXPECTORATED SPUTUM  Final   Special Requests NONE  Final   Sputum evaluation   Final    THIS SPECIMEN IS ACCEPTABLE FOR SPUTUM CULTURE Performed at [facility], [place] [address], [place], [place] [address]    Report Status 12/30/2019 FINAL  Final  Culture, respiratory     Status: None   Collection Time: 12/30/19  3:52 AM  Result Value Ref Range Status   Specimen Description EXPECTORATED SPUTUM  Final   Special Requests NONE Reflexed from [id number]  Final   Gram Stain   Final    MODERATE WBC PRESENT, PREDOMINANTLY PMN FEW SQUAMOUS EPITHELIAL CELLS PRESENT ABUNDANT GRAM POSITIVE COCCI MODERATE GRAM NEGATIVE RODS MODERATE GRAM POSITIVE RODS    Culture   Final    Normal respiratory flora-no Staph aureus or Pseudomonas seen Performed at [facility], [place] [address], [place], [place] [address]    Report Status 01/01/2020 FINAL  Final    Alcide Evener, [place] [facility] [phone] pager  01/02/2020, 10:55 AM

## 2020-01-02 NOTE — Progress Notes (Signed)
Physical Therapy Treatment Patient Details Name: Donna May MRN: [id number] DOB: [birth year] Today's Date: 01/02/2020    History of Present Illness Patient is a 79 y/o female who presents with SOB, LE edema. Admitted with CHF exacerbation and HTN urgency. CXR-pukmonary edema. PMH includes DM, CKD III, anemia and cholangiocarcinoma.    PT Comments    Pt progressing with mobility. From PT standpoint ready for return home when medically ready.    Follow Up Recommendations  Home health PT;Supervision - Intermittent     Equipment Recommendations  None recommended by PT    Recommendations for Other Services       Precautions / Restrictions Precautions Precautions: Fall Precaution Comments: neuropathy hands/feet in hand/sfeet    Mobility  Bed Mobility Overal bed mobility: Modified Independent                Transfers Overall transfer level: Needs assistance Equipment used: None Transfers: Sit to/from Stand Sit to Stand: Supervision            Ambulation/Gait Ambulation/Gait assistance: Supervision Gait Distance (Feet): 15 Feet (pt had just amb in hall with OT) Assistive device: None Gait Pattern/deviations: Step-through pattern;Decreased stride length Gait velocity: decr Gait velocity interpretation: 1.31 - 2.62 ft/sec, indicative of limited community ambulator General Gait Details: supervision for safety. Pt amb around room without assistive device and occasional touching of furniture with hand   Stairs Stairs: Yes Stairs assistance: Min guard Stair Management: One rail Right;One rail Left;Step to pattern;Alternating pattern;Forwards Number of Stairs: 2 (1 x 2) General stair comments: Used portable step with sink counter as simulated rail.    Wheelchair Mobility    Modified Rankin (Stroke Patients Only)       Balance Overall balance assessment: Mild deficits observed, not formally tested           Standing balance-Leahy Scale:  Fair Standing balance comment: has peripheral neuropathy in feet affecting balance                            Cognition Arousal/Alertness: Awake/alert Behavior During Therapy: WFL for tasks assessed/performed Overall Cognitive Status: No family/caregiver present to determine baseline cognitive functioning                                 General Comments: most likely at baseline      Exercises      General Comments General comments (skin integrity, edema, etc.): Ambulated @ 150 ft using RW with S      Pertinent Vitals/Pain Pain Assessment: Faces Faces Pain Scale: Hurts a little bit Pain Location: feet Pain Descriptors / Indicators: Tingling;Pins and needles Pain Intervention(s): Limited activity within patient's tolerance    Home Living                      Prior Function            PT Goals (current goals can now be found in the care plan section) Acute Rehab PT Goals Patient Stated Goal: to go home Progress towards PT goals: Progressing toward goals    Frequency    Min 3X/week      PT Plan Current plan remains appropriate    Co-evaluation              AM-PAC PT "6 Clicks" Mobility   Outcome Measure  Help needed turning from your back to  your side while in a flat bed without using bedrails?: None Help needed moving from lying on your back to sitting on the side of a flat bed without using bedrails?: None Help needed moving to and from a bed to a chair (including a wheelchair)?: None Help needed standing up from a chair using your arms (e.g., wheelchair or bedside chair)?: None Help needed to walk in hospital room?: None Help needed climbing 3-5 steps with a railing? : A Little 6 Click Score: 23    End of Session   Activity Tolerance: Patient tolerated treatment well Patient left: in bed;with call bell/phone within reach   PT Visit Diagnosis: Muscle weakness (generalized) (M62.81);Unsteadiness on feet (R26.81)      Time: 0034-9611 PT Time Calculation (min) (ACUTE ONLY): 8 min  Charges:  $Gait Training: 8-22 mins                     [place] Pager [phone] Office [place] 01/02/2020, 1:02 PM

## 2020-01-02 NOTE — Progress Notes (Signed)
Report given to Sullivan Lone), RN. Patient is alert and oriented x4 resting in bed with call light in reach. VSS. Low grade fever noted overnight and relieved with Tylenol.  No labs drawn this AM. CT resulted from overnight. Patient likely will be DC'd today with home health services.

## 2020-01-02 NOTE — Progress Notes (Signed)
Occupational Therapy Treatment Patient Details Name: Donna May MRN: [id number] DOB: Aug 26, 1940 Today's Date: 01/02/2020    History of present illness Patient is a 79 y/o female who presents with SOB, LE edema. Admitted with CHF exacerbation and HTN urgency. CXR-pukmonary edema. PMH includes DM, CKD III, anemia and cholangiocarcinoma.   OT comments  Pt making progress toward goals. Peripheral neuropathy in hands and feet make ADL tasks more difficult. Would benefit from built up tubing for utensils/grooming objects to increase ability to manage tasks. Would most likely benefit from AE to assist with IADL tasks. Will continue to follow acutely to facilitate safe DC home with HHOT. VSS during session.   Follow Up Recommendations  Home health OT;Supervision - Intermittent    Equipment Recommendations  None recommended by OT    Recommendations for Other Services      Precautions / Restrictions Precautions Precautions: Fall Precaution Comments: neuropathy hands/feet in hand/sfeet       Mobility Bed Mobility Overal bed mobility: Modified Independent                Transfers Overall transfer level: Needs assistance   Transfers: Sit to/from Stand Sit to Stand: Supervision              Balance Overall balance assessment: Mild deficits observed, not formally tested           Standing balance-Leahy Scale: Fair Standing balance comment: has peripheral neuropathy in feet affecting balance; safest when holding onto surface during dynamic tasks                           ADL either performed or assessed with clinical judgement   ADL Overall ADL's : Needs assistance/impaired                                     Functional mobility during ADLs: Supervision/safety;Rolling walker General ADL Comments: Pt overall set up for ADL tasks; Has peripheral neuropathy B hands and holding toothbrush, comb, utensils is difficulty. Would benefit from  built up tubing. Educated pt on need to stabilize self during dynamic tasks to reduce risk of falls     Vision       Perception     Praxis      Cognition Arousal/Alertness: Awake/alert Behavior During Therapy: WFL for tasks assessed/performed "Let's get this over with" Overall Cognitive Status: No family/caregiver present to determine baseline cognitive functioning                                 General Comments: most likely at baseline        Exercises     Shoulder Instructions       General Comments Ambulated @ 150 ft using RW with S    Pertinent Vitals/ Pain       Pain Assessment: Faces Faces Pain Scale: Hurts a little bit Pain Location: feet Pain Descriptors / Indicators: Tingling;Pins and needles Pain Intervention(s): Limited activity within patient's tolerance  Home Living                                          Prior Functioning/Environment  Frequency  Min 2X/week        Progress Toward Goals  OT Goals(current goals can now be found in the care plan section)  Progress towards OT goals: Progressing toward goals  Acute Rehab OT Goals Patient Stated Goal: to go home OT Goal Formulation: With patient Time For Goal Achievement: 01/14/20 Potential to Achieve Goals: Good ADL Goals Pt Will Perform Lower Body Bathing: with modified independence;sitting/lateral leans;sit to/from stand;with adaptive equipment Pt Will Perform Lower Body Dressing: with modified independence;sitting/lateral leans;sit to/from stand;with adaptive equipment Pt Will Transfer to Toilet: with modified independence;ambulating;regular height toilet  Plan Discharge plan remains appropriate    Co-evaluation                 AM-PAC OT "6 Clicks" Daily Activity     Outcome Measure   Help from another person eating meals?: A Little Help from another person taking care of personal grooming?: A Little Help from another person  toileting, which includes using toliet, bedpan, or urinal?: A Little Help from another person bathing (including washing, rinsing, drying)?: A Little Help from another person to put on and taking off regular upper body clothing?: A Little Help from another person to put on and taking off regular lower body clothing?: A Little 6 Click Score: 18    End of Session Equipment Utilized During Treatment: Gait belt;Rolling walker  OT Visit Diagnosis: Unsteadiness on feet (R26.81);Other abnormalities of gait and mobility (R26.89);Muscle weakness (generalized) (M62.81);Pain Pain - part of body:  (B feet)   Activity Tolerance Patient tolerated treatment well   Patient Left in bed;Other (comment) (working with PT)   Nurse Communication Mobility status        Time: [phone] OT Time Calculation (min): 20 min  Charges: OT General Charges $OT Visit: 1 Visit OT Treatments $Self Care/Home Management : 8-22 mins  Maurie Boettcher, OT/L   Acute OT Clinical Specialist Acute Rehabilitation Services Pager [phone] Office [phone]    [facility] 01/02/2020, 12:29 PM

## 2020-01-02 NOTE — Progress Notes (Signed)
PROGRESS NOTE  Donna May TGY:[id number] DOB: October 26, 1940 DOA: 12/27/2019 PCP: [name], MD  Brief History   The patient is a 79 yr old woman who presented to [facility] ED on 12/27/2019 with complaints of shortness of breath. She had had chemotherapy for cholangiocarcinoma earlier in the day. She also had increased lower extremity edema. She has chronic edema of the right arm due to prior mastectomy for BRCA in the 1990's.  In the ED her CXR demonstrated pulmonary edema and she had a BNP of 2301.  [facility] had been consulted to admit the patient for further evaluation and care.  The patient had fevers of 100, 101, 100.4, 101.3 on 12/29/2019. Then she had fevers of 101.6 and102.8 on the evening of 12/30/2019, 101.6 and 100.8 on the morning of 12/31/2019, and 101.2 on the morning of 01/01/2020. No source of infection has been identified. CXR was re-evaluated and demonstrated clearing of the pulmonary edema. It demonstrated no new acute abnormalities. CT chest, abdomen, and pelvis has demonstrated atelectasis vs infiltrate in the lungs, although this seems an unlikely source of the fevers. UA is negative, and the patient has had no complaints of dysuria. I have discussed the patient with Dr. Tommy Medal of ID. He has evaluated the patient and is in favor of removal of the port. General surgery has also been consulted. She had another fever this morning.  The patient has been evaluated by PT/OT. They have recommended discharge to home with home health PT/OT.  Consultants  . Infectious Disease . General Surgery  Procedures  . None  Antibiotics   Anti-infectives (From admission, onward)   Start     Dose/Rate Route Frequency Ordered Stop   12/29/19 1915  cefTRIAXone (ROCEPHIN) 1 g in sodium chloride 0.9 % 100 mL IVPB  Status:  Discontinued        1 g 200 mL/hr over 30 Minutes Intravenous Every 24 hours 12/29/19 1820 12/30/19 1741   12/29/19 1915  azithromycin (ZITHROMAX) 500 mg in  sodium chloride 0.9 % 250 mL IVPB  Status:  Discontinued        500 mg 250 mL/hr over 60 Minutes Intravenous Every 24 hours 12/29/19 1820 12/30/19 1741   12/29/19 1915  cefTRIAXone (ROCEPHIN) 2 g in sodium chloride 0.9 % 100 mL IVPB  Status:  Discontinued        2 g 200 mL/hr over 30 Minutes Intravenous Every 24 hours 12/29/19 1827 12/29/19 1833   12/29/19 1915  azithromycin (ZITHROMAX) 500 mg in sodium chloride 0.9 % 250 mL IVPB  Status:  Discontinued        500 mg 250 mL/hr over 60 Minutes Intravenous Every 24 hours 12/29/19 1827 12/29/19 1833     Subjective  The patient is resting comfortably. No new complaints.  Objective   Vitals:  Vitals:   01/02/20 0735 01/02/20 1114  BP: (!) 138/58 (!) 120/53  Pulse: 67 65  Resp: 16 16  Temp: 98.3 F (36.8 C) 98 F (36.7 C)  SpO2: 93%    Exam:  Constitutional:  . The patient is awake, alert, and oriented x 3. No acute distress. Marland Kitchen  Respiratory:  . No increased work of breathing. . No wheezes or rhonchi . Rales in right mid-lung region. . No tactile fremitus Cardiovascular:  . Regular rate and rhythm . No murmurs, ectopy, or gallups. . No lateral PMI. No thrills. Abdomen:  . Abdomen is soft, non-tender, non-distended . No hernias, masses, or organomegaly . Normoactive bowel  sounds.  Musculoskeletal:  . No cyanosis, clubbing, or edema Skin:  . No rashes, lesions, ulcers . palpation of skin: no induration or nodules Neurologic:  . CN 2-12 intact . Sensation all 4 extremities intact Psychiatric:  . Mental status o Mood, affect appropriate o Orientation to person, place, time  . judgment and insight appear intact  I have personally reviewed the following:   Today's Data  . Vitals  Imaging  . CXR: Pulmonary edema vs infiltrate  Cardiology Data  . BNP 2301 . EKG - unchanged, LVH gives appearance of elevated ST segments. . Troponins - elevated at 28. Trend. No EKG changes indicating ischemia.  Scheduled Meds: .  amLODipine  10 mg Oral Daily  . enoxaparin (LOVENOX) injection  30 mg Subcutaneous Q24H  . furosemide  40 mg Oral Daily  . hydrALAZINE  50 mg Oral Q8H  . insulin aspart  0-15 Units Subcutaneous TID WC  . irbesartan  300 mg Oral Daily  . loratadine  10 mg Oral Daily  . metoprolol succinate  100 mg Oral Daily  . sodium chloride flush  3 mL Intravenous Q12H   Continuous Infusions: . sodium chloride      Principal Problem:   Acute on chronic diastolic CHF (congestive heart failure) (HCC) Active Problems:   Diabetes mellitus with renal complications (HCC)   Essential hypertension   Intrahepatic cholangiocarcinoma (HCC)   Acute respiratory failure with hypoxia (HCC)   CKD (chronic kidney disease), stage III (HCC)   Hypertensive urgency   Acute respiratory failure (HCC)   LOS: 5 days   A & P  Acute on chronic diastolic CHF / HTN urgency: Resolving. The patient has been admitted to the CHF pathway. She is being diuresed with lasix 40 mg daily. She has been continued on home antihypertensives (avapro, HCTZ, and metoprolol). She will be monitored on telemetry. Creatinine, electrolytes and volume status will be closely monitored.   Febrile illness: Concern for pneumonia instead of pulmonary edema. I started the patient on rocephin and azithromycin. Repeat CXR negative for infiltrates. Will stop IV antibiotics as no source of infection has been identified. Respiratory panel is negative. Blood cultures x 2 gave had no growth. The patient had fevers of 100, 101, 100.4, 101.3 on 12/29/2019. Then she had fevers of 101.6 and102.8 on the evening of 12/30/2019, 101.6 and 100.8 on the morning of 12/31/2019, and 101.2 on the morning of 01/01/2020. No source of infection has been identified. CXR was re-evaluated and demonstrated clearing of the pulmonary edema. CT chest, abdomen, and pelvis has demonstrated atelectasis vs infiltrate in the lungs, although this seems an unlikely source of the fevers. UA is  negative, and the patient has had no complaints of dysuria. I have discussed the patient with Dr. Tommy Medal of ID. He has evaluated the patient and is in favor of removal of the port as resumption of chemotherapy as the patient wishes to do would be catastrophic if the port is a source of infection. General surgery has also been consulted. She had another fever this morning.   It demonstrated no new acute abnormalities.I have also discussed the patient with general surgery as ID has recommended that she have her port removed. Per ID fevers may be due to cholangiocarcinoma alone.  Cholangiocarcinoma: Cholangiocarcinoma has shown promising response to therapy. On 10/6 Dr. Krista Blue recd stopping chemo for the time being since they dont see any hypermetabolic lesions on PET scan.  Pt worried about progression of cancer so wanted to  continue chemo.  HTN: The patient has been continued on home avapro, HCTZ, and metoprolol, but remains hypertensive. Norvasc was added with minimal improvement.  Unable to increase metoprolol due to marginal heart rate. I will add hydralazine scheduled to improve BPcontrol.  Acute Kidney Injury on CKD stage 4: Creatinine of 1.49 is baseline. This morning it is up to 1.99 with diuresis. Monitor closely. Avoid nephrotoxic medications and hypotension. Monitor creatinine, electrolytes, and volume status.  DMII: At home the patient's glucose is managed with Januvia. Here her glucoses are monitored by FSBS and SSI. Glucoses for the last 24 hours are 83-157.  Debility: The patient was evaluated by PT/OT for disposition recommendations. They have recommended home with home health PT/OT.  I have seen and examined this patient myself. I have spent 36 minutes in her evaluation and care.  DVT prophylaxis: Lovenox Code Status: Patient wants her code status changed back to full code at this time she says. Family Communication: No family in room Disposition Plan: Home after breathing  improved  Kaela Beitz, DO [facility] Direct contact: see [email or web address]  7PM-7AM contact night coverage as above 01/02/2020, 4:37 PM  LOS: 0 days

## 2020-01-03 ENCOUNTER — Inpatient Hospital Stay (HOSPITAL_COMMUNITY): Payer: Medicare Other

## 2020-01-03 ENCOUNTER — Other Ambulatory Visit: Payer: Self-pay | Admitting: *Deleted

## 2020-01-03 DIAGNOSIS — I361 Nonrheumatic tricuspid (valve) insufficiency: Secondary | ICD-10-CM | POA: Diagnosis not present

## 2020-01-03 DIAGNOSIS — I509 Heart failure, unspecified: Secondary | ICD-10-CM

## 2020-01-03 DIAGNOSIS — I340 Nonrheumatic mitral (valve) insufficiency: Secondary | ICD-10-CM | POA: Diagnosis not present

## 2020-01-03 DIAGNOSIS — R509 Fever, unspecified: Secondary | ICD-10-CM | POA: Diagnosis not present

## 2020-01-03 DIAGNOSIS — I5033 Acute on chronic diastolic (congestive) heart failure: Secondary | ICD-10-CM | POA: Diagnosis not present

## 2020-01-03 LAB — COMPREHENSIVE METABOLIC PANEL
ALT: 12 U/L (ref 0–44)
AST: 21 U/L (ref 15–41)
Albumin: 2.1 g/dL — ABNORMAL LOW (ref 3.5–5.0)
Alkaline Phosphatase: 61 U/L (ref 38–126)
Anion gap: 11 (ref 5–15)
BUN: 61 mg/dL — ABNORMAL HIGH (ref 8–23)
CO2: 22 mmol/L (ref 22–32)
Calcium: 8.3 mg/dL — ABNORMAL LOW (ref 8.9–10.3)
Chloride: 104 mmol/L (ref 98–111)
Creatinine, Ser: 2.05 mg/dL — ABNORMAL HIGH (ref 0.44–1.00)
GFR, Estimated: 22 mL/min — ABNORMAL LOW (ref >60)
Glucose, Bld: 90 mg/dL (ref 70–99)
Potassium: 3.9 mmol/L (ref 3.5–5.1)
Sodium: 137 mmol/L (ref 135–145)
Total Bilirubin: 0.9 mg/dL (ref 0.3–1.2)
Total Protein: 5.5 g/dL — ABNORMAL LOW (ref 6.5–8.1)

## 2020-01-03 LAB — CBC WITH DIFFERENTIAL/PLATELET
Abs Immature Granulocytes: 0.17 10*3/uL — ABNORMAL HIGH (ref 0.00–0.07)
Basophils Absolute: 0 10*3/uL (ref 0.0–0.1)
Basophils Relative: 0 %
Eosinophils Absolute: 0.1 10*3/uL (ref 0.0–0.5)
Eosinophils Relative: 1 %
HCT: 22.7 % — ABNORMAL LOW (ref 36.0–46.0)
Hemoglobin: 7.2 g/dL — ABNORMAL LOW (ref 12.0–15.0)
Immature Granulocytes: 2 %
Lymphocytes Relative: 8 %
Lymphs Abs: 0.8 10*3/uL (ref 0.7–4.0)
MCH: 30.8 pg (ref 26.0–34.0)
MCHC: 31.7 g/dL (ref 30.0–36.0)
MCV: 97 fL (ref 80.0–100.0)
Monocytes Absolute: 0.4 10*3/uL (ref 0.1–1.0)
Monocytes Relative: 4 %
Neutro Abs: 8.1 10*3/uL — ABNORMAL HIGH (ref 1.7–7.7)
Neutrophils Relative %: 85 %
Platelets: 103 10*3/uL — ABNORMAL LOW (ref 150–400)
RBC: 2.34 MIL/uL — ABNORMAL LOW (ref 3.87–5.11)
RDW: 18.3 % — ABNORMAL HIGH (ref 11.5–15.5)
WBC: 9.6 10*3/uL (ref 4.0–10.5)
nRBC: 0.5 % — ABNORMAL HIGH (ref 0.0–0.2)

## 2020-01-03 LAB — CULTURE, BLOOD (ROUTINE X 2)
Culture: NO GROWTH
Culture: NO GROWTH
Special Requests: ADEQUATE
Special Requests: ADEQUATE

## 2020-01-03 LAB — GLUCOSE, CAPILLARY
Glucose-Capillary: 84 mg/dL (ref 70–99)
Glucose-Capillary: 106 mg/dL — ABNORMAL HIGH (ref 70–99)
Glucose-Capillary: 89 mg/dL (ref 70–99)
Glucose-Capillary: 162 mg/dL — ABNORMAL HIGH (ref 70–99)

## 2020-01-03 LAB — ECHOCARDIOGRAM COMPLETE
AR max vel: 2.58 cm2
AV Area VTI: 2.53 cm2
AV Area mean vel: 2.41 cm2
AV Mean grad: 3 mmHg
AV Peak grad: 6.1 mmHg
Ao pk vel: 1.23 m/s
Area-P 1/2: 2.8 cm2
Height: 65 in
S' Lateral: 2.6 cm
Weight: 2024 oz

## 2020-01-03 MED ORDER — PANTOPRAZOLE SODIUM 20 MG PO TBEC
20.0000 mg | DELAYED_RELEASE_TABLET | Freq: Every day | ORAL | Status: DC
  Administered 2020-01-03 – 2020-01-05 (×3): 20 mg via ORAL
  Filled 2020-01-03 (×3): qty 1

## 2020-01-03 MED ORDER — SENNOSIDES-DOCUSATE SODIUM 8.6-50 MG PO TABS
1.0000 | ORAL_TABLET | Freq: Every day | ORAL | Status: DC
  Administered 2020-01-03 – 2020-01-04 (×2): 1 via ORAL
  Filled 2020-01-03 (×2): qty 1

## 2020-01-03 MED ORDER — DOCUSATE SODIUM 100 MG PO CAPS
100.0000 mg | ORAL_CAPSULE | Freq: Every day | ORAL | Status: DC
  Administered 2020-01-03 – 2020-01-05 (×3): 100 mg via ORAL
  Filled 2020-01-03 (×3): qty 1

## 2020-01-03 NOTE — Consult Note (Signed)
   [facility] Inpatient Consult   01/03/2020  Donna May November 04, 1940 [id number]  Follow up:  Active with [facility]  Late entry:  01/02/20  14:45:  Spoke with patient at bedside regarding her home scale for monitoring her weight in the face of HF.  Patient states that she has a working scale.  Also, spoke with the patient about her follow up with ongoing follow up with [facility] [[facility]] program.  Patient states, "I may be getting phone calls from them but I am not sure who all is calling me at times."  Patient will receive a post hospital call and will be evaluated for assessments and disease process education.    Plan: Will update [facility] of ongoing follow up needs and to assess if [facility] is active with patient. Continue to follow for progress and disposition needs.   Of note, [facility] Care Management services does not replace or interfere with any services that are needed or arranged by inpatient [facility] care management team.  For additional questions or referrals please contact:  Natividad Brood, RN BSN [facility] Liaison  [phone] business mobile phone Toll free office [phone]  Fax number: [phone] [name]

## 2020-01-03 NOTE — Progress Notes (Signed)
PROGRESS NOTE    Donna May  YIF:[id number] DOB: 05/23/1940 DOA: 12/27/2019 PCP: [name], MD     Brief Narrative:  Donna May is an 79 yr old female who presented to [facility] ED on 12/27/2019 with complaints of shortness of breath. She had chemotherapy for cholangiocarcinoma earlier in the day. She also had increased lower extremity edema. She has chronic edema of the right arm due to prior mastectomy for BRCA in the 1990's.  In the ED her CXR demonstrated pulmonary edema and she had a BNP of 2301.  [facility] had been consulted to admit the patient for further evaluation and care.  The patient had fevers of 100, 101, 100.4, 101.3 on 12/29/2019. Then she had fevers of 101.6 and 102.8 on the evening of 12/30/2019, 101.6 and 100.8 on the morning of 12/31/2019, and 101.2 on the morning of 01/01/2020. No source of infection has been identified. CXR was re-evaluated and demonstrated clearing of the pulmonary edema. It demonstrated no new acute abnormalities. CT chest, abdomen, and pelvis has demonstrated atelectasis vs infiltrate in the lungs, although this seems an unlikely source of the fevers. UA is negative, and the patient has had no complaints of dysuria. Infectious disease recommended removal of the port. General surgery has also been consulted. She had another fever this morning.  The patient has been evaluated by PT/OT. They have recommended discharge to home with home health PT/OT.  New events last 24 hours / Subjective: Patient without any new complaints today.  Afebrile overnight, but has been receiving Tylenol.  I discussed with Dr. Burr Medico; she plans to evaluate patient after her clinic today.  Assessment & Plan:   Principal Problem:   Acute on chronic diastolic CHF (congestive heart failure) (HCC) Active Problems:   Diabetes mellitus with renal complications (HCC)   Essential hypertension   Intrahepatic cholangiocarcinoma (HCC)   Acute respiratory  failure with hypoxia (HCC)   CKD (chronic kidney disease), stage III (HCC)   Hypertensive urgency   Acute respiratory failure (HCC)   Acute on chronic diastolic CHF -Patient was diuresed with Lasix. Continue Avapro, Lasix, Toprol, Norvasc, hydralazine -Resolved   Fever of unknown origin -Initially concern for pneumonia and patient was started on Rocephin and azithromycin.  Repeat chest x-ray was negative.  Antibiotics were stopped. CT chest, abdomen, and pelvis has demonstrated atelectasis vs infiltrate in the lungs, although this seems an unlikely source of the fevers -Respiratory viral panel negative -Respiratory culture negative -Blood cultures negative -UA negative -Infectious disease consulted, recommending port removal as it is a nidus of infection with patient's ongoing fevers -Echocardiogram pending -Ordered venous dopplers to rule out DVT as source of fever   Cholangiocarcinoma chemotherapy -Followed by Dr. Burr Medico   HTN -Continue Avapro, Lasix, Toprol, Norvasc, hydralazine  Acute Kidney Injury on CKD stage 4 -Baseline creatinine around 1.5 -Improving, monitor  DMII -Sliding scale insulin    DVT prophylaxis:  enoxaparin (LOVENOX) injection 30 mg Start: 12/28/19 0800  Code Status: Full code Family Communication: No family at bedside Disposition Plan:  Status is: Inpatient  Remains inpatient appropriate because:IV treatments appropriate due to intensity of illness or inability to take PO   Dispo: The patient is from: Home              Anticipated d/c is to: Home              Anticipated d/c date is: 2 days  Patient currently is not medically stable to d/c.  Awaiting oncology input regarding port removal.   Consultants:   General surgery  Infectious disease  Oncology   Antimicrobials:  Anti-infectives (From admission, onward)   Start     Dose/Rate Route Frequency Ordered Stop   12/29/19 1915  cefTRIAXone (ROCEPHIN) 1 g in sodium  chloride 0.9 % 100 mL IVPB  Status:  Discontinued        1 g 200 mL/hr over 30 Minutes Intravenous Every 24 hours 12/29/19 1820 12/30/19 1741   12/29/19 1915  azithromycin (ZITHROMAX) 500 mg in sodium chloride 0.9 % 250 mL IVPB  Status:  Discontinued        500 mg 250 mL/hr over 60 Minutes Intravenous Every 24 hours 12/29/19 1820 12/30/19 1741   12/29/19 1915  cefTRIAXone (ROCEPHIN) 2 g in sodium chloride 0.9 % 100 mL IVPB  Status:  Discontinued        2 g 200 mL/hr over 30 Minutes Intravenous Every 24 hours 12/29/19 1827 12/29/19 1833   12/29/19 1915  azithromycin (ZITHROMAX) 500 mg in sodium chloride 0.9 % 250 mL IVPB  Status:  Discontinued        500 mg 250 mL/hr over 60 Minutes Intravenous Every 24 hours 12/29/19 1827 12/29/19 1833        Objective: Vitals:   01/03/20 0003 01/03/20 0030 01/03/20 0455 01/03/20 0823  BP: (!) 144/64  (!) 134/55 (!) 137/56  Pulse: 75  67 69  Resp: $Remo'16  16 16  'cPaEM$ Temp: 99.4 F (37.4 C)  98.6 F (37 C) 98.2 F (36.8 C)  TempSrc: Oral  Oral Oral  SpO2: 95% 95% 94% 94%  Weight:   57.4 kg   Height:        Intake/Output Summary (Last 24 hours) at 01/03/2020 1015 Last data filed at 01/03/2020 0800 Gross per 24 hour  Intake 150 ml  Output 650 ml  Net -500 ml   Filed Weights   01/01/20 0451 01/02/20 0508 01/03/20 0455  Weight: 57.6 kg 58.1 kg 57.4 kg    Examination:  General exam: Appears calm and comfortable  Respiratory system: Clear to auscultation. Respiratory effort normal. No respiratory distress. No conversational dyspnea.  Cardiovascular system: S1 & S2 heard, RRR. No murmurs. No pedal edema. Gastrointestinal system: Abdomen is nondistended, soft and nontender. Normal bowel sounds heard. Central nervous system: Alert and oriented. No focal neurological deficits. Speech clear.  Extremities: Symmetric  Skin: No rashes, lesions or ulcers on exposed skin  Psychiatry: Judgement and insight appear normal. Mood & affect appropriate.   Data  Reviewed: I have personally reviewed following labs and imaging studies  CBC: Recent Labs  Lab 12/27/19 2050 12/29/19 0219 01/03/20 0309  WBC 13.4* 8.5 9.6  NEUTROABS 11.8* 7.4 8.1*  HGB 10.4* 9.5* 7.2*  HCT 33.8* 29.4* 22.7*  MCV 99.4 97.0 97.0  PLT 259 160 417*   Basic Metabolic Panel: Recent Labs  Lab 12/29/19 0219 12/30/19 0131 12/31/19 0155 01/01/20 0327 01/03/20 0309  NA 140 138 138 134* 137  K 3.7 3.4* 4.2 3.7 3.9  CL 105 102 104 101 104  CO2 23 25 21* 23 22  GLUCOSE 96 111* 95 102* 90  BUN 44* 51* 54* 62* 61*  CREATININE 1.66* 1.97* 2.19* 2.40* 2.05*  CALCIUM 8.6* 8.4* 8.3* 8.1* 8.3*   GFR: Estimated Creatinine Clearance: 19.7 mL/min (A) (by C-G formula based on SCr of 2.05 mg/dL (H)). Liver Function Tests: Recent Labs  Lab 01/03/20 0309  AST 21  ALT 12  ALKPHOS 61  BILITOT 0.9  PROT 5.5*  ALBUMIN 2.1*   No results for input(s): LIPASE, AMYLASE in the last 168 hours. No results for input(s): AMMONIA in the last 168 hours. Coagulation Profile: No results for input(s): INR, PROTIME in the last 168 hours. Cardiac Enzymes: No results for input(s): CKTOTAL, CKMB, CKMBINDEX, TROPONINI in the last 168 hours. BNP (last 3 results) No results for input(s): PROBNP in the last 8760 hours. HbA1C: No results for input(s): HGBA1C in the last 72 hours. CBG: Recent Labs  Lab 01/02/20 0746 01/02/20 1114 01/02/20 1633 01/02/20 2139 01/03/20 0746  GLUCAP 84 143* 109* 90 84   Lipid Profile: No results for input(s): CHOL, HDL, LDLCALC, TRIG, CHOLHDL, LDLDIRECT in the last 72 hours. Thyroid Function Tests: No results for input(s): TSH, T4TOTAL, FREET4, T3FREE, THYROIDAB in the last 72 hours. Anemia Panel: No results for input(s): VITAMINB12, FOLATE, FERRITIN, TIBC, IRON, RETICCTPCT in the last 72 hours. Sepsis Labs: No results for input(s): PROCALCITON, LATICACIDVEN in the last 168 hours.  Recent Results (from the past 240 hour(s))  Respiratory Panel by RT  PCR (Flu A&B, Covid) - Nasopharyngeal Swab     Status: None   Collection Time: 12/27/19  8:50 PM   Specimen: Nasopharyngeal Swab  Result Value Ref Range Status   SARS Coronavirus 2 by RT PCR NEGATIVE NEGATIVE Final    Comment: (NOTE) SARS-CoV-2 target nucleic acids are NOT DETECTED.  The SARS-CoV-2 RNA is generally detectable in upper respiratoy specimens during the acute phase of infection. The lowest concentration of SARS-CoV-2 viral copies this assay can detect is 131 copies/mL. A negative result does not preclude SARS-Cov-2 infection and should not be used as the sole basis for treatment or other patient management decisions. A negative result may occur with  improper specimen collection/handling, submission of specimen other than nasopharyngeal swab, presence of viral mutation(s) within the areas targeted by this assay, and inadequate number of viral copies (<131 copies/mL). A negative result must be combined with clinical observations, patient history, and epidemiological information. The expected result is Negative.  Fact Sheet for Patients:  PinkCheek.be  Fact Sheet for Healthcare Providers:  GravelBags.it  This test is no t yet approved or cleared by the [place] FDA and  has been authorized for detection and/or diagnosis of SARS-CoV-2 by FDA under an Emergency Use Authorization (EUA). This EUA will remain  in effect (meaning this test can be used) for the duration of the COVID-19 declaration under Section 564(b)(1) of the Act, 21 U.S.C. section 360bbb-3(b)(1), unless the authorization is terminated or revoked sooner.     Influenza A by PCR NEGATIVE NEGATIVE Final   Influenza B by PCR NEGATIVE NEGATIVE Final    Comment: (NOTE) The Xpert Xpress SARS-CoV-2/FLU/RSV assay is intended as an aid in  the diagnosis of influenza from Nasopharyngeal swab specimens and  should not be used as a sole basis for  treatment. Nasal washings and  aspirates are unacceptable for Xpert Xpress SARS-CoV-2/FLU/RSV  testing.  Fact Sheet for Patients: PinkCheek.be  Fact Sheet for Healthcare Providers: GravelBags.it  This test is not yet approved or cleared by the [place] FDA and  has been authorized for detection and/or diagnosis of SARS-CoV-2 by  FDA under an Emergency Use Authorization (EUA). This EUA will remain  in effect (meaning this test can be used) for the duration of the  Covid-19 declaration under Section 564(b)(1) of the Act, 21  U.S.C. section 360bbb-3(b)(1), unless the authorization  is  terminated or revoked. Performed at [facility], [place] [address] [place], [place] [address]   Culture, blood (routine x 2)     Status: None (Preliminary result)   Collection Time: 12/29/19  8:29 AM   Specimen: BLOOD LEFT HAND  Result Value Ref Range Status   Specimen Description BLOOD LEFT HAND  Final   Special Requests   Final    BOTTLES DRAWN AEROBIC ONLY Blood Culture adequate volume   Culture   Final    NO GROWTH 4 DAYS Performed at [facility], [place] [address] [place], [place] [address]    Report Status PENDING  Incomplete  Culture, blood (routine x 2)     Status: None (Preliminary result)   Collection Time: 12/29/19 10:22 AM   Specimen: BLOOD LEFT HAND  Result Value Ref Range Status   Specimen Description BLOOD LEFT HAND  Final   Special Requests   Final    BOTTLES DRAWN AEROBIC ONLY Blood Culture adequate volume   Culture   Final    NO GROWTH 4 DAYS Performed at [facility], [address] [place], [place] [address]    Report Status PENDING  Incomplete  Respiratory Panel by PCR     Status: None   Collection Time: 12/29/19  9:36 PM   Specimen: Nasopharyngeal Swab; Respiratory  Result Value Ref Range Status   Adenovirus NOT DETECTED NOT DETECTED Final   Coronavirus 229E NOT DETECTED NOT DETECTED Final      Comment: (NOTE) The Coronavirus on the Respiratory Panel, DOES NOT test for the novel  Coronavirus (2019 nCoV)    Coronavirus HKU1 NOT DETECTED NOT DETECTED Final   Coronavirus NL63 NOT DETECTED NOT DETECTED Final   Coronavirus OC43 NOT DETECTED NOT DETECTED Final   Metapneumovirus NOT DETECTED NOT DETECTED Final   Rhinovirus / Enterovirus NOT DETECTED NOT DETECTED Final   Influenza A NOT DETECTED NOT DETECTED Final   Influenza B NOT DETECTED NOT DETECTED Final   Parainfluenza Virus 1 NOT DETECTED NOT DETECTED Final   Parainfluenza Virus 2 NOT DETECTED NOT DETECTED Final   Parainfluenza Virus 3 NOT DETECTED NOT DETECTED Final   Parainfluenza Virus 4 NOT DETECTED NOT DETECTED Final   Respiratory Syncytial Virus NOT DETECTED NOT DETECTED Final   Bordetella pertussis NOT DETECTED NOT DETECTED Final   Chlamydophila pneumoniae NOT DETECTED NOT DETECTED Final   Mycoplasma pneumoniae NOT DETECTED NOT DETECTED Final    Comment: Performed at [facility], [place] [place], [place] [address]  Sputum culture     Status: None   Collection Time: 12/30/19  3:52 AM   Specimen: Expectorated Sputum  Result Value Ref Range Status   Specimen Description EXPECTORATED SPUTUM  Final   Special Requests NONE  Final   Sputum evaluation   Final    THIS SPECIMEN IS ACCEPTABLE FOR SPUTUM CULTURE Performed at [facility], [place] [address], [place], [place] [address]    Report Status 12/30/2019 FINAL  Final  Culture, respiratory     Status: None   Collection Time: 12/30/19  3:52 AM  Result Value Ref Range Status   Specimen Description EXPECTORATED SPUTUM  Final   Special Requests NONE Reflexed from [id number]  Final   Gram Stain   Final    MODERATE WBC PRESENT, PREDOMINANTLY PMN FEW SQUAMOUS EPITHELIAL CELLS PRESENT ABUNDANT GRAM POSITIVE COCCI MODERATE GRAM NEGATIVE RODS MODERATE GRAM POSITIVE RODS    Culture   Final  Normal respiratory flora-no Staph aureus or Pseudomonas  seen Performed at [place] [address], [place], [place] [address]    Report Status 01/01/2020 FINAL  Final      Radiology Studies: CT ABDOMEN PELVIS WO CONTRAST  Result Date: 01/01/2020 CLINICAL DATA:  79 year old female with shortness of breath and productive cough. History of cholangiocarcinoma. EXAM: CT CHEST, ABDOMEN AND PELVIS WITHOUT CONTRAST TECHNIQUE: Multidetector CT imaging of the chest, abdomen and pelvis was performed following the standard protocol without IV contrast. COMPARISON:  Chest radiograph dated 12/29/2019 and CT dated 01/20/2019. FINDINGS: Evaluation of this exam is limited in the absence of intravenous contrast. CT CHEST FINDINGS Cardiovascular: Borderline cardiomegaly. No pericardial effusion. There is coronary vascular calcification. There is hypoattenuation of the cardiac blood pool suggestive of anemia. Clinical correlation is recommended. There is moderate atherosclerotic calcification of the thoracic aorta. No aneurysmal dilatation. There is dilatation of the main pulmonary trunk suggestive of a degree of pulmonary hypertension. Clinical correlation is recommended. Left-sided Port-A-Cath with tip at the cavoatrial junction. Mediastinum/Nodes: No definite hilar or mediastinal adenopathy. Evaluation however is limited in the absence of intravenous contrast. The esophagus and the thyroid gland are grossly unremarkable as visualized. No mediastinal fluid collection. Lungs/Pleura: Small bilateral pleural effusions. There are subsegmental bilateral lower lobe consolidative changes which may represent atelectasis or pneumonia. Similar appearance of a 5 mm nodular density in the right lower lobe (95/7). There is no pneumothorax. The central airways are patent. Musculoskeletal: Degenerative changes of the spine. No acute osseous pathology. CT ABDOMEN PELVIS FINDINGS No intra-abdominal free air or free fluid. Hepatobiliary: Several small hepatic hypodense lesions which  are not characterized on this noncontrast CT but demonstrate fluid attenuation, likely cysts. These appear similar to prior CT. There is interval decrease in the size of the segment 4 low attenuating area now measuring approximately 2.2 x 4.3 cm (previously 4.2 x 7.0 cm). The gallbladder is unremarkable as visualized. Pancreas: The pancreas is grossly unremarkable. Spleen: Normal in size without focal abnormality. Adrenals/Urinary Tract: The adrenal glands unremarkable. There is no hydronephrosis or nephrolithiasis on either side. Multiple bilateral renal cysts as seen previously. The visualized ureters and urinary bladder appear unremarkable. Stomach/Bowel: There is moderate stool throughout the colon. There is no bowel obstruction or active inflammation. The appendix is normal. Vascular/Lymphatic: Advanced aortoiliac atherosclerotic disease. The IVC is unremarkable. No portal venous gas. There is no adenopathy. Reproductive: Small partially calcified uterine fibroids. Other: Diffuse subcutaneous edema and anasarca new since the prior CT. Musculoskeletal: Degenerative changes.  No acute osseous pathology. IMPRESSION: 1. Small bilateral pleural effusions with subsegmental bilateral lower lobe consolidative changes which may represent atelectasis or pneumonia. Clinical correlation is recommended. This finding is new compared to prior CT. 2. Interval decrease in the size of the segment IV hepatic low attenuating area. 3. Anasarca, new since the prior CT. 4. Aortic Atherosclerosis (ICD10-I70.0). Electronically Signed   By: Anner Crete M.D.   On: 01/01/2020 23:17   CT CHEST WO CONTRAST  Result Date: 01/01/2020 CLINICAL DATA:  79 year old female with shortness of breath and productive cough. History of cholangiocarcinoma. EXAM: CT CHEST, ABDOMEN AND PELVIS WITHOUT CONTRAST TECHNIQUE: Multidetector CT imaging of the chest, abdomen and pelvis was performed following the standard protocol without IV contrast.  COMPARISON:  Chest radiograph dated 12/29/2019 and CT dated 01/20/2019. FINDINGS: Evaluation of this exam is limited in the absence of intravenous contrast. CT CHEST FINDINGS Cardiovascular: Borderline cardiomegaly. No pericardial effusion. There is coronary vascular calcification. There  is hypoattenuation of the cardiac blood pool suggestive of anemia. Clinical correlation is recommended. There is moderate atherosclerotic calcification of the thoracic aorta. No aneurysmal dilatation. There is dilatation of the main pulmonary trunk suggestive of a degree of pulmonary hypertension. Clinical correlation is recommended. Left-sided Port-A-Cath with tip at the cavoatrial junction. Mediastinum/Nodes: No definite hilar or mediastinal adenopathy. Evaluation however is limited in the absence of intravenous contrast. The esophagus and the thyroid gland are grossly unremarkable as visualized. No mediastinal fluid collection. Lungs/Pleura: Small bilateral pleural effusions. There are subsegmental bilateral lower lobe consolidative changes which may represent atelectasis or pneumonia. Similar appearance of a 5 mm nodular density in the right lower lobe (95/7). There is no pneumothorax. The central airways are patent. Musculoskeletal: Degenerative changes of the spine. No acute osseous pathology. CT ABDOMEN PELVIS FINDINGS No intra-abdominal free air or free fluid. Hepatobiliary: Several small hepatic hypodense lesions which are not characterized on this noncontrast CT but demonstrate fluid attenuation, likely cysts. These appear similar to prior CT. There is interval decrease in the size of the segment 4 low attenuating area now measuring approximately 2.2 x 4.3 cm (previously 4.2 x 7.0 cm). The gallbladder is unremarkable as visualized. Pancreas: The pancreas is grossly unremarkable. Spleen: Normal in size without focal abnormality. Adrenals/Urinary Tract: The adrenal glands unremarkable. There is no hydronephrosis or  nephrolithiasis on either side. Multiple bilateral renal cysts as seen previously. The visualized ureters and urinary bladder appear unremarkable. Stomach/Bowel: There is moderate stool throughout the colon. There is no bowel obstruction or active inflammation. The appendix is normal. Vascular/Lymphatic: Advanced aortoiliac atherosclerotic disease. The IVC is unremarkable. No portal venous gas. There is no adenopathy. Reproductive: Small partially calcified uterine fibroids. Other: Diffuse subcutaneous edema and anasarca new since the prior CT. Musculoskeletal: Degenerative changes.  No acute osseous pathology. IMPRESSION: 1. Small bilateral pleural effusions with subsegmental bilateral lower lobe consolidative changes which may represent atelectasis or pneumonia. Clinical correlation is recommended. This finding is new compared to prior CT. 2. Interval decrease in the size of the segment IV hepatic low attenuating area. 3. Anasarca, new since the prior CT. 4. Aortic Atherosclerosis (ICD10-I70.0). Electronically Signed   By: Anner Crete M.D.   On: 01/01/2020 23:17      Scheduled Meds: . amLODipine  10 mg Oral Daily  . docusate sodium  100 mg Oral Daily  . enoxaparin (LOVENOX) injection  30 mg Subcutaneous Q24H  . furosemide  40 mg Oral Daily  . hydrALAZINE  50 mg Oral Q8H  . insulin aspart  0-15 Units Subcutaneous TID WC  . irbesartan  300 mg Oral Daily  . loratadine  10 mg Oral Daily  . metoprolol succinate  100 mg Oral Daily  . senna-docusate  1 tablet Oral QHS  . sodium chloride flush  3 mL Intravenous Q12H   Continuous Infusions: . sodium chloride       LOS: 6 days      Time spent: 35 minutes   Dessa Phi, DO [facility] 01/03/2020, 10:15 AM   Available via Epic secure chat 7am-7pm After these hours, please refer to coverage provider listed on [email or web address]

## 2020-01-03 NOTE — Progress Notes (Signed)
Report given to Vaughan Basta, RN and Conley Rolls, Therapist, sports. Patient is alert and oriented x4 resting in bed with call light in reach. VSS. Patient states no pain or distress at this time. Tylenol given prophylactically overnight to prevent any fevers. Patient did awaken diaphoretic stating she felt as though she broke a fever overnight.  Plan is to remove port after input from oncology.  Work with PT today. Plan is to be [place] home when stable with home health services.  Hgb 7.2, platelets 103, K+ 3.9 and Creat 2.05 this AM.

## 2020-01-03 NOTE — Progress Notes (Signed)
Subjective: No new complaints   Antibiotics:  Anti-infectives (From admission, onward)   Start     Dose/Rate Route Frequency Ordered Stop   12/29/19 1915  cefTRIAXone (ROCEPHIN) 1 g in sodium chloride 0.9 % 100 mL IVPB  Status:  Discontinued        1 g 200 mL/hr over 30 Minutes Intravenous Every 24 hours 12/29/19 1820 12/30/19 1741   12/29/19 1915  azithromycin (ZITHROMAX) 500 mg in sodium chloride 0.9 % 250 mL IVPB  Status:  Discontinued        500 mg 250 mL/hr over 60 Minutes Intravenous Every 24 hours 12/29/19 1820 12/30/19 1741   12/29/19 1915  cefTRIAXone (ROCEPHIN) 2 g in sodium chloride 0.9 % 100 mL IVPB  Status:  Discontinued        2 g 200 mL/hr over 30 Minutes Intravenous Every 24 hours 12/29/19 1827 12/29/19 1833   12/29/19 1915  azithromycin (ZITHROMAX) 500 mg in sodium chloride 0.9 % 250 mL IVPB  Status:  Discontinued        500 mg 250 mL/hr over 60 Minutes Intravenous Every 24 hours 12/29/19 1827 12/29/19 1833      Medications: Scheduled Meds: . amLODipine  10 mg Oral Daily  . docusate sodium  100 mg Oral Daily  . enoxaparin (LOVENOX) injection  30 mg Subcutaneous Q24H  . furosemide  40 mg Oral Daily  . hydrALAZINE  50 mg Oral Q8H  . insulin aspart  0-15 Units Subcutaneous TID WC  . irbesartan  300 mg Oral Daily  . loratadine  10 mg Oral Daily  . metoprolol succinate  100 mg Oral Daily  . pantoprazole  20 mg Oral Daily  . senna-docusate  1 tablet Oral QHS  . sodium chloride flush  3 mL Intravenous Q12H   Continuous Infusions: . sodium chloride     PRN Meds:.sodium chloride, ondansetron (ZOFRAN) IV, sodium chloride, sodium chloride flush    Objective: Weight change: -0.72 kg  Intake/Output Summary (Last 24 hours) at 01/03/2020 1738 Last data filed at 01/03/2020 1556 Gross per 24 hour  Intake --  Output 700 ml  Net -700 ml   Blood pressure (!) 149/67, pulse 69, temperature 98.5 F (36.9 C), temperature source Oral, resp. rate 20, height  5\' 5"  (1.651 m), weight 57.4 kg, SpO2 94 %. Temp:  [98.2 F (36.8 C)-99.4 F (37.4 C)] 98.5 F (36.9 C) (10/20 1724) Pulse Rate:  [61-75] 69 (10/20 1724) Resp:  [16-20] 20 (10/20 1724) BP: (133-149)/(55-67) 149/67 (10/20 1724) SpO2:  [92 %-95 %] 94 % (10/20 1724) Weight:  [57.4 kg] 57.4 kg (10/20 0455)  Physical Exam: General: Alert and awake, oriented x3, not in any acute distress. Neuro: nonfocal  CBC:    BMET Recent Labs    01/01/20 0327 01/03/20 0309  NA 134* 137  K 3.7 3.9  CL 101 104  CO2 23 22  GLUCOSE 102* 90  BUN 62* 61*  CREATININE 2.40* 2.05*  CALCIUM 8.1* 8.3*     Liver Panel  Recent Labs    01/03/20 0309  PROT 5.5*  ALBUMIN 2.1*  AST 21  ALT 12  ALKPHOS 61  BILITOT 0.9       Sedimentation Rate No results for input(s): ESRSEDRATE in the last 72 hours. C-Reactive Protein No results for input(s): CRP in the last 72 hours.  Micro Results: Recent Results (from the past 720 hour(s))  Respiratory Panel by RT PCR (Flu A&B, Covid) - Nasopharyngeal  Swab     Status: None   Collection Time: 12/27/19  8:50 PM   Specimen: Nasopharyngeal Swab  Result Value Ref Range Status   SARS Coronavirus 2 by RT PCR NEGATIVE NEGATIVE Final    Comment: (NOTE) SARS-CoV-2 target nucleic acids are NOT DETECTED.  The SARS-CoV-2 RNA is generally detectable in upper respiratoy specimens during the acute phase of infection. The lowest concentration of SARS-CoV-2 viral copies this assay can detect is 131 copies/mL. A negative result does not preclude SARS-Cov-2 infection and should not be used as the sole basis for treatment or other patient management decisions. A negative result may occur with  improper specimen collection/handling, submission of specimen other than nasopharyngeal swab, presence of viral mutation(s) within the areas targeted by this assay, and inadequate number of viral copies (<131 copies/mL). A negative result must be combined with  clinical observations, patient history, and epidemiological information. The expected result is Negative.  Fact Sheet for Patients:  PinkCheek.be  Fact Sheet for Healthcare Providers:  GravelBags.it  This test is no t yet approved or cleared by the [place] FDA and  has been authorized for detection and/or diagnosis of SARS-CoV-2 by FDA under an Emergency Use Authorization (EUA). This EUA will remain  in effect (meaning this test can be used) for the duration of the COVID-19 declaration under Section 564(b)(1) of the Act, 21 U.S.C. section 360bbb-3(b)(1), unless the authorization is terminated or revoked sooner.     Influenza A by PCR NEGATIVE NEGATIVE Final   Influenza B by PCR NEGATIVE NEGATIVE Final    Comment: (NOTE) The Xpert Xpress SARS-CoV-2/FLU/RSV assay is intended as an aid in  the diagnosis of influenza from Nasopharyngeal swab specimens and  should not be used as a sole basis for treatment. Nasal washings and  aspirates are unacceptable for Xpert Xpress SARS-CoV-2/FLU/RSV  testing.  Fact Sheet for Patients: PinkCheek.be  Fact Sheet for Healthcare Providers: GravelBags.it  This test is not yet approved or cleared by the [place] FDA and  has been authorized for detection and/or diagnosis of SARS-CoV-2 by  FDA under an Emergency Use Authorization (EUA). This EUA will remain  in effect (meaning this test can be used) for the duration of the  Covid-19 declaration under Section 564(b)(1) of the Act, 21  U.S.C. section 360bbb-3(b)(1), unless the authorization is  terminated or revoked. Performed at [facility], [place] [address] [place], [place] [address]   Culture, blood (routine x 2)     Status: None   Collection Time: 12/29/19  8:29 AM   Specimen: BLOOD LEFT HAND  Result Value Ref Range Status   Specimen Description BLOOD LEFT HAND   Final   Special Requests   Final    BOTTLES DRAWN AEROBIC ONLY Blood Culture adequate volume   Culture   Final    NO GROWTH 5 DAYS Performed at [facility], [place] [address] [place], [place] [address]    Report Status 01/03/2020 FINAL  Final  Culture, blood (routine x 2)     Status: None   Collection Time: 12/29/19 10:22 AM   Specimen: BLOOD LEFT HAND  Result Value Ref Range Status   Specimen Description BLOOD LEFT HAND  Final   Special Requests   Final    BOTTLES DRAWN AEROBIC ONLY Blood Culture adequate volume   Culture   Final    NO GROWTH 5 DAYS Performed at [facility], [place] [address], [place],  [address]  Report Status 01/03/2020 FINAL  Final  Respiratory Panel by PCR     Status: None   Collection Time: 12/29/19  9:36 PM   Specimen: Nasopharyngeal Swab; Respiratory  Result Value Ref Range Status   Adenovirus NOT DETECTED NOT DETECTED Final   Coronavirus 229E NOT DETECTED NOT DETECTED Final    Comment: (NOTE) The Coronavirus on the Respiratory Panel, DOES NOT test for the novel  Coronavirus (2019 nCoV)    Coronavirus HKU1 NOT DETECTED NOT DETECTED Final   Coronavirus NL63 NOT DETECTED NOT DETECTED Final   Coronavirus OC43 NOT DETECTED NOT DETECTED Final   Metapneumovirus NOT DETECTED NOT DETECTED Final   Rhinovirus / Enterovirus NOT DETECTED NOT DETECTED Final   Influenza A NOT DETECTED NOT DETECTED Final   Influenza B NOT DETECTED NOT DETECTED Final   Parainfluenza Virus 1 NOT DETECTED NOT DETECTED Final   Parainfluenza Virus 2 NOT DETECTED NOT DETECTED Final   Parainfluenza Virus 3 NOT DETECTED NOT DETECTED Final   Parainfluenza Virus 4 NOT DETECTED NOT DETECTED Final   Respiratory Syncytial Virus NOT DETECTED NOT DETECTED Final   Bordetella pertussis NOT DETECTED NOT DETECTED Final   Chlamydophila pneumoniae NOT DETECTED NOT DETECTED Final   Mycoplasma pneumoniae NOT DETECTED NOT DETECTED Final    Comment: Performed at [facility], [place]. [address], [place], [place] [address]  Sputum culture     Status: None   Collection Time: 12/30/19  3:52 AM   Specimen: Expectorated Sputum  Result Value Ref Range Status   Specimen Description EXPECTORATED SPUTUM  Final   Special Requests NONE  Final   Sputum evaluation   Final    THIS SPECIMEN IS ACCEPTABLE FOR SPUTUM CULTURE Performed at [facility], [place] [address], [place], [place] [address]    Report Status 12/30/2019 FINAL  Final  Culture, respiratory     Status: None   Collection Time: 12/30/19  3:52 AM  Result Value Ref Range Status   Specimen Description EXPECTORATED SPUTUM  Final   Special Requests NONE Reflexed from [id number]  Final   Gram Stain   Final    MODERATE WBC PRESENT, PREDOMINANTLY PMN FEW SQUAMOUS EPITHELIAL CELLS PRESENT ABUNDANT GRAM POSITIVE COCCI MODERATE GRAM NEGATIVE RODS MODERATE GRAM POSITIVE RODS    Culture   Final    Normal respiratory flora-no Staph aureus or Pseudomonas seen Performed at [facility], [place] [address], [place], [place] [address]    Report Status 01/01/2020 FINAL  Final    Studies/Results: CT ABDOMEN PELVIS WO CONTRAST  Result Date: 01/01/2020 CLINICAL DATA:  79 year old female with shortness of breath and productive cough. History of cholangiocarcinoma. EXAM: CT CHEST, ABDOMEN AND PELVIS WITHOUT CONTRAST TECHNIQUE: Multidetector CT imaging of the chest, abdomen and pelvis was performed following the standard protocol without IV contrast. COMPARISON:  Chest radiograph dated 12/29/2019 and CT dated 01/20/2019. FINDINGS: Evaluation of this exam is limited in the absence of intravenous contrast. CT CHEST FINDINGS Cardiovascular: Borderline cardiomegaly. No pericardial effusion. There is coronary vascular calcification. There is hypoattenuation of the cardiac blood pool suggestive of anemia. Clinical correlation is recommended. There is moderate atherosclerotic calcification of the thoracic aorta. No aneurysmal dilatation.  There is dilatation of the main pulmonary trunk suggestive of a degree of pulmonary hypertension. Clinical correlation is recommended. Left-sided Port-A-Cath with tip at the cavoatrial junction. Mediastinum/Nodes: No definite hilar or mediastinal adenopathy. Evaluation however is limited in the absence of intravenous contrast. The esophagus and the thyroid gland are  grossly unremarkable as visualized. No mediastinal fluid collection. Lungs/Pleura: Small bilateral pleural effusions. There are subsegmental bilateral lower lobe consolidative changes which may represent atelectasis or pneumonia. Similar appearance of a 5 mm nodular density in the right lower lobe (95/7). There is no pneumothorax. The central airways are patent. Musculoskeletal: Degenerative changes of the spine. No acute osseous pathology. CT ABDOMEN PELVIS FINDINGS No intra-abdominal free air or free fluid. Hepatobiliary: Several small hepatic hypodense lesions which are not characterized on this noncontrast CT but demonstrate fluid attenuation, likely cysts. These appear similar to prior CT. There is interval decrease in the size of the segment 4 low attenuating area now measuring approximately 2.2 x 4.3 cm (previously 4.2 x 7.0 cm). The gallbladder is unremarkable as visualized. Pancreas: The pancreas is grossly unremarkable. Spleen: Normal in size without focal abnormality. Adrenals/Urinary Tract: The adrenal glands unremarkable. There is no hydronephrosis or nephrolithiasis on either side. Multiple bilateral renal cysts as seen previously. The visualized ureters and urinary bladder appear unremarkable. Stomach/Bowel: There is moderate stool throughout the colon. There is no bowel obstruction or active inflammation. The appendix is normal. Vascular/Lymphatic: Advanced aortoiliac atherosclerotic disease. The IVC is unremarkable. No portal venous gas. There is no adenopathy. Reproductive: Small partially calcified uterine fibroids. Other: Diffuse  subcutaneous edema and anasarca new since the prior CT. Musculoskeletal: Degenerative changes.  No acute osseous pathology. IMPRESSION: 1. Small bilateral pleural effusions with subsegmental bilateral lower lobe consolidative changes which may represent atelectasis or pneumonia. Clinical correlation is recommended. This finding is new compared to prior CT. 2. Interval decrease in the size of the segment IV hepatic low attenuating area. 3. Anasarca, new since the prior CT. 4. Aortic Atherosclerosis (ICD10-I70.0). Electronically Signed   By: Anner Crete M.D.   On: 01/01/2020 23:17   CT CHEST WO CONTRAST  Result Date: 01/01/2020 CLINICAL DATA:  79 year old female with shortness of breath and productive cough. History of cholangiocarcinoma. EXAM: CT CHEST, ABDOMEN AND PELVIS WITHOUT CONTRAST TECHNIQUE: Multidetector CT imaging of the chest, abdomen and pelvis was performed following the standard protocol without IV contrast. COMPARISON:  Chest radiograph dated 12/29/2019 and CT dated 01/20/2019. FINDINGS: Evaluation of this exam is limited in the absence of intravenous contrast. CT CHEST FINDINGS Cardiovascular: Borderline cardiomegaly. No pericardial effusion. There is coronary vascular calcification. There is hypoattenuation of the cardiac blood pool suggestive of anemia. Clinical correlation is recommended. There is moderate atherosclerotic calcification of the thoracic aorta. No aneurysmal dilatation. There is dilatation of the main pulmonary trunk suggestive of a degree of pulmonary hypertension. Clinical correlation is recommended. Left-sided Port-A-Cath with tip at the cavoatrial junction. Mediastinum/Nodes: No definite hilar or mediastinal adenopathy. Evaluation however is limited in the absence of intravenous contrast. The esophagus and the thyroid gland are grossly unremarkable as visualized. No mediastinal fluid collection. Lungs/Pleura: Small bilateral pleural effusions. There are subsegmental  bilateral lower lobe consolidative changes which may represent atelectasis or pneumonia. Similar appearance of a 5 mm nodular density in the right lower lobe (95/7). There is no pneumothorax. The central airways are patent. Musculoskeletal: Degenerative changes of the spine. No acute osseous pathology. CT ABDOMEN PELVIS FINDINGS No intra-abdominal free air or free fluid. Hepatobiliary: Several small hepatic hypodense lesions which are not characterized on this noncontrast CT but demonstrate fluid attenuation, likely cysts. These appear similar to prior CT. There is interval decrease in the size of the segment 4 low attenuating area now measuring approximately 2.2 x 4.3 cm (previously 4.2 x 7.0 cm). The gallbladder is unremarkable  as visualized. Pancreas: The pancreas is grossly unremarkable. Spleen: Normal in size without focal abnormality. Adrenals/Urinary Tract: The adrenal glands unremarkable. There is no hydronephrosis or nephrolithiasis on either side. Multiple bilateral renal cysts as seen previously. The visualized ureters and urinary bladder appear unremarkable. Stomach/Bowel: There is moderate stool throughout the colon. There is no bowel obstruction or active inflammation. The appendix is normal. Vascular/Lymphatic: Advanced aortoiliac atherosclerotic disease. The IVC is unremarkable. No portal venous gas. There is no adenopathy. Reproductive: Small partially calcified uterine fibroids. Other: Diffuse subcutaneous edema and anasarca new since the prior CT. Musculoskeletal: Degenerative changes.  No acute osseous pathology. IMPRESSION: 1. Small bilateral pleural effusions with subsegmental bilateral lower lobe consolidative changes which may represent atelectasis or pneumonia. Clinical correlation is recommended. This finding is new compared to prior CT. 2. Interval decrease in the size of the segment IV hepatic low attenuating area. 3. Anasarca, new since the prior CT. 4. Aortic Atherosclerosis  (ICD10-I70.0). Electronically Signed   By: Anner Crete M.D.   On: 01/01/2020 23:17   VAS [place] LOWER EXTREMITY VENOUS (DVT)  Result Date: 01/03/2020  Lower Venous DVTStudy Indications: Fever of unkown origin.  Comparison Study: No prior studies. Performing Technologist: Darlin Coco  Examination Guidelines: A complete evaluation includes B-mode imaging, spectral Doppler, color Doppler, and power Doppler as needed of all accessible portions of each vessel. Bilateral testing is considered an integral part of a complete examination. Limited examinations for reoccurring indications may be performed as noted. The reflux portion of the exam is performed with the patient in reverse Trendelenburg.  +---------+---------------+---------+-----------+----------+--------------+ RIGHT    CompressibilityPhasicitySpontaneityPropertiesThrombus Aging +---------+---------------+---------+-----------+----------+--------------+ CFV      Full           Yes      Yes                                 +---------+---------------+---------+-----------+----------+--------------+ SFJ      Full                                                        +---------+---------------+---------+-----------+----------+--------------+ FV Prox  Full                                                        +---------+---------------+---------+-----------+----------+--------------+ FV Mid   Full                                                        +---------+---------------+---------+-----------+----------+--------------+ FV DistalFull                                                        +---------+---------------+---------+-----------+----------+--------------+ PFV      Full                                                        +---------+---------------+---------+-----------+----------+--------------+  POP      Full           Yes      Yes                                  +---------+---------------+---------+-----------+----------+--------------+ PTV      Full                                                        +---------+---------------+---------+-----------+----------+--------------+ PERO     Full                                                        +---------+---------------+---------+-----------+----------+--------------+   +---------+---------------+---------+-----------+----------+--------------+ LEFT     CompressibilityPhasicitySpontaneityPropertiesThrombus Aging +---------+---------------+---------+-----------+----------+--------------+ CFV      Full           Yes      Yes                                 +---------+---------------+---------+-----------+----------+--------------+ SFJ      Full                                                        +---------+---------------+---------+-----------+----------+--------------+ FV Prox  Full                                                        +---------+---------------+---------+-----------+----------+--------------+ FV Mid   Full                                                        +---------+---------------+---------+-----------+----------+--------------+ FV DistalFull                                                        +---------+---------------+---------+-----------+----------+--------------+ PFV      Full                                                        +---------+---------------+---------+-----------+----------+--------------+ POP      Full           Yes      Yes                                 +---------+---------------+---------+-----------+----------+--------------+  PTV      Full                                                        +---------+---------------+---------+-----------+----------+--------------+ PERO     Full                                                         +---------+---------------+---------+-----------+----------+--------------+     Summary: RIGHT: - There is no evidence of deep vein thrombosis in the lower extremity.  - No cystic structure found in the popliteal fossa.  LEFT: - There is no evidence of deep vein thrombosis in the lower extremity.  - No cystic structure found in the popliteal fossa.  *See table(s) above for measurements and observations.    Preliminary       Assessment/Plan:  INTERVAL HISTORY:   Patient has been afebrile though she had received Tylenol twice in the last 2 days on the 18th and 19th  Principal Problem:   Acute on chronic diastolic CHF (congestive heart failure) ([place]) Active Problems:   Diabetes mellitus with renal complications ([place])   Essential hypertension   Intrahepatic cholangiocarcinoma (H[facility])   Acute respiratory failure with hypoxia ([facility])   CKD (chronic kidney disease), stage III ([place])   Hypertensive urgency   Acute respiratory failure ([place])    Donna May is a 79 y.o. female with history of cholangiocarcinoma on chemotherapy admitted with shortness of breath and pulmonary edema.  She separately became febrile but no clear-cut cause was identified.  Work-up has included blood cultures urine analysis, CT chest abdomen pelvis.  Given negative work-up I was in favor of removing her Port-A-Cath as it could be a source of infection.  Surgery however reluctant to do this absent oncology recommendation.  Dr. Burr Medico called me today and discussed this case together.  Patient has indeed been afebrile though she has been receiving acetaminophen.  I would like to stop her acetaminophen and see how she does off of this.  If she can become afebrile in the absence of antipyretics then I think it is reasonable for her to keep her port and to ascribe her fevers to potential atelectasis.  Certainly she becomes febrile again I would favor removal of the port.      LOS: 6 days   Alcide Evener 01/03/2020, 5:38 PM

## 2020-01-03 NOTE — Progress Notes (Signed)
  Echocardiogram 2D Echocardiogram has been performed.  Marybelle Killings 01/03/2020, 1:54 PM

## 2020-01-03 NOTE — Progress Notes (Addendum)
Donna May   DOB:March 19, 1940   WP#:[id number]   NKN#:[id number]  Oncology follow up    Subjective: Patient is well-known to me, under my care for her unresectable cholangiocarcinoma.  She was admitted for CHF exacerbation on December 31, 2019.  She developed fever 2 days after hospital admission, infectious disease work-up were negative, she was initially treated with antibiotics, off now.  She is afebrile today, last dose of Tylenol was mid last night. She feels better overall.    Objective:  Vitals:   01/03/20 1406 01/03/20 1724  BP: (!) 133/56 (!) 149/67  Pulse:  69  Resp:  20  Temp:  98.5 F (36.9 C)  SpO2:  94%    Body mass index is 21.05 kg/m.  Intake/Output Summary (Last 24 hours) at 01/03/2020 1838 Last data filed at 01/03/2020 1556 Gross per 24 hour  Intake --  Output 700 ml  Net -700 ml     Sclerae unicteric  Oropharynx clear  No peripheral adenopathy  Lungs clear -- no rales or rhonchi  Heart regular rate and rhythm  Abdomen benign  MSK no focal spinal tenderness, no peripheral edema  Neuro nonfocal   CBG (last 3)  Recent Labs    01/03/20 0746 01/03/20 1137 01/03/20 1642  GLUCAP 84 106* 89     Labs:  Urine Studies No results for input(s): UHGB, CRYS in the last 72 hours.  Invalid input(s): UACOL, UAPR, USPG, UPH, UTP, UGL, UKET, UBIL, UNIT, UROB, ULEU, UEPI, UWBC, URBC, UBAC, [place], [place], [place]  Basic Metabolic Panel: Recent Labs  Lab 12/29/19 0219 12/29/19 0219 12/30/19 0131 12/30/19 0131 12/31/19 0155 12/31/19 0155 01/01/20 0327 01/03/20 0309  NA 140  --  138  --  138  --  134* 137  K 3.7   < > 3.4*   < > 4.2   < > 3.7 3.9  CL 105  --  102  --  104  --  101 104  CO2 23  --  25  --  21*  --  23 22  GLUCOSE 96  --  111*  --  95  --  102* 90  BUN 44*  --  51*  --  54*  --  62* 61*  CREATININE 1.66*  --  1.97*  --  2.19*  --  2.40* 2.05*  CALCIUM 8.6*  --  8.4*  --  8.3*  --  8.1* 8.3*   < > = values in this interval not displayed.    GFR Estimated Creatinine Clearance: 19.7 mL/min (A) (by C-G formula based on SCr of 2.05 mg/dL (H)). Liver Function Tests: Recent Labs  Lab 01/03/20 0309  AST 21  ALT 12  ALKPHOS 61  BILITOT 0.9  PROT 5.5*  ALBUMIN 2.1*   No results for input(s): LIPASE, AMYLASE in the last 168 hours. No results for input(s): AMMONIA in the last 168 hours. Coagulation profile No results for input(s): INR, PROTIME in the last 168 hours.  CBC: Recent Labs  Lab 12/27/19 2050 12/29/19 0219 01/03/20 0309  WBC 13.4* 8.5 9.6  NEUTROABS 11.8* 7.4 8.1*  HGB 10.4* 9.5* 7.2*  HCT 33.8* 29.4* 22.7*  MCV 99.4 97.0 97.0  PLT 259 160 103*   Cardiac Enzymes: No results for input(s): CKTOTAL, CKMB, CKMBINDEX, TROPONINI in the last 168 hours. BNP: Invalid input(s): POCBNP CBG: Recent Labs  Lab 01/02/20 1633 01/02/20 2139 01/03/20 0746 01/03/20 1137 01/03/20 1642  GLUCAP 109* 90 84 106* 89   D-Dimer No  results for input(s): DDIMER in the last 72 hours. Hgb A1c No results for input(s): HGBA1C in the last 72 hours. Lipid Profile No results for input(s): CHOL, HDL, LDLCALC, TRIG, CHOLHDL, LDLDIRECT in the last 72 hours. Thyroid function studies No results for input(s): TSH, T4TOTAL, T3FREE, THYROIDAB in the last 72 hours.  Invalid input(s): FREET3 Anemia work up No results for input(s): VITAMINB12, FOLATE, FERRITIN, TIBC, IRON, RETICCTPCT in the last 72 hours. Microbiology Recent Results (from the past 240 hour(s))  Respiratory Panel by RT PCR (Flu A&B, Covid) - Nasopharyngeal Swab     Status: None   Collection Time: 12/27/19  8:50 PM   Specimen: Nasopharyngeal Swab  Result Value Ref Range Status   SARS Coronavirus 2 by RT PCR NEGATIVE NEGATIVE Final    Comment: (NOTE) SARS-CoV-2 target nucleic acids are NOT DETECTED.  The SARS-CoV-2 RNA is generally detectable in upper respiratoy specimens during the acute phase of infection. The lowest concentration of SARS-CoV-2 viral copies this  assay can detect is 131 copies/mL. A negative result does not preclude SARS-Cov-2 infection and should not be used as the sole basis for treatment or other patient management decisions. A negative result may occur with  improper specimen collection/handling, submission of specimen other than nasopharyngeal swab, presence of viral mutation(s) within the areas targeted by this assay, and inadequate number of viral copies (<131 copies/mL). A negative result must be combined with clinical observations, patient history, and epidemiological information. The expected result is Negative.  Fact Sheet for Patients:  PinkCheek.be  Fact Sheet for Healthcare Providers:  GravelBags.it  This test is no t yet approved or cleared by the [place] FDA and  has been authorized for detection and/or diagnosis of SARS-CoV-2 by FDA under an Emergency Use Authorization (EUA). This EUA will remain  in effect (meaning this test can be used) for the duration of the COVID-19 declaration under Section 564(b)(1) of the Act, 21 U.S.C. section 360bbb-3(b)(1), unless the authorization is terminated or revoked sooner.     Influenza A by PCR NEGATIVE NEGATIVE Final   Influenza B by PCR NEGATIVE NEGATIVE Final    Comment: (NOTE) The Xpert Xpress SARS-CoV-2/FLU/RSV assay is intended as an aid in  the diagnosis of influenza from Nasopharyngeal swab specimens and  should not be used as a sole basis for treatment. Nasal washings and  aspirates are unacceptable for Xpert Xpress SARS-CoV-2/FLU/RSV  testing.  Fact Sheet for Patients: PinkCheek.be  Fact Sheet for Healthcare Providers: GravelBags.it  This test is not yet approved or cleared by the [place] FDA and  has been authorized for detection and/or diagnosis of SARS-CoV-2 by  FDA under an Emergency Use Authorization (EUA). This EUA will  remain  in effect (meaning this test can be used) for the duration of the  Covid-19 declaration under Section 564(b)(1) of the Act, 21  U.S.C. section 360bbb-3(b)(1), unless the authorization is  terminated or revoked. Performed at [facility], [place] [address] [place], [place] [address]   Culture, blood (routine x 2)     Status: None   Collection Time: 12/29/19  8:29 AM   Specimen: BLOOD LEFT HAND  Result Value Ref Range Status   Specimen Description BLOOD LEFT HAND  Final   Special Requests   Final    BOTTLES DRAWN AEROBIC ONLY Blood Culture adequate volume   Culture   Final    NO GROWTH 5 DAYS Performed at [facility], [place] [address], [place], [place] [address]  Report Status 01/03/2020 FINAL  Final  Culture, blood (routine x 2)     Status: None   Collection Time: 12/29/19 10:22 AM   Specimen: BLOOD LEFT HAND  Result Value Ref Range Status   Specimen Description BLOOD LEFT HAND  Final   Special Requests   Final    BOTTLES DRAWN AEROBIC ONLY Blood Culture adequate volume   Culture   Final    NO GROWTH 5 DAYS Performed at [facility], [address] [place], [place] [address]    Report Status 01/03/2020 FINAL  Final  Respiratory Panel by PCR     Status: None   Collection Time: 12/29/19  9:36 PM   Specimen: Nasopharyngeal Swab; Respiratory  Result Value Ref Range Status   Adenovirus NOT DETECTED NOT DETECTED Final   Coronavirus 229E NOT DETECTED NOT DETECTED Final    Comment: (NOTE) The Coronavirus on the Respiratory Panel, DOES NOT test for the novel  Coronavirus (2019 nCoV)    Coronavirus HKU1 NOT DETECTED NOT DETECTED Final   Coronavirus NL63 NOT DETECTED NOT DETECTED Final   Coronavirus OC43 NOT DETECTED NOT DETECTED Final   Metapneumovirus NOT DETECTED NOT DETECTED Final   Rhinovirus / Enterovirus NOT DETECTED NOT DETECTED Final   Influenza A NOT DETECTED NOT DETECTED Final   Influenza B NOT DETECTED NOT DETECTED Final   Parainfluenza Virus  1 NOT DETECTED NOT DETECTED Final   Parainfluenza Virus 2 NOT DETECTED NOT DETECTED Final   Parainfluenza Virus 3 NOT DETECTED NOT DETECTED Final   Parainfluenza Virus 4 NOT DETECTED NOT DETECTED Final   Respiratory Syncytial Virus NOT DETECTED NOT DETECTED Final   Bordetella pertussis NOT DETECTED NOT DETECTED Final   Chlamydophila pneumoniae NOT DETECTED NOT DETECTED Final   Mycoplasma pneumoniae NOT DETECTED NOT DETECTED Final    Comment: Performed at [facility], [place], [place], [place] [address]  Sputum culture     Status: None   Collection Time: 12/30/19  3:52 AM   Specimen: Expectorated Sputum  Result Value Ref Range Status   Specimen Description EXPECTORATED SPUTUM  Final   Special Requests NONE  Final   Sputum evaluation   Final    THIS SPECIMEN IS ACCEPTABLE FOR SPUTUM CULTURE Performed at [facility], [place] [address], [place], [place] [address]    Report Status 12/30/2019 FINAL  Final  Culture, respiratory     Status: None   Collection Time: 12/30/19  3:52 AM  Result Value Ref Range Status   Specimen Description EXPECTORATED SPUTUM  Final   Special Requests NONE Reflexed from [id number]  Final   Gram Stain   Final    MODERATE WBC PRESENT, PREDOMINANTLY PMN FEW SQUAMOUS EPITHELIAL CELLS PRESENT ABUNDANT GRAM POSITIVE COCCI MODERATE GRAM NEGATIVE RODS MODERATE GRAM POSITIVE RODS    Culture   Final    Normal respiratory flora-no Staph aureus or Pseudomonas seen Performed at [facility], [name] [address] [place],  [address]    Report Status 01/01/2020 FINAL  Final      Studies:  CT ABDOMEN PELVIS WO CONTRAST  Result Date: 01/01/2020 CLINICAL DATA:  79 year old female with shortness of breath and productive cough. History of cholangiocarcinoma. EXAM: CT CHEST, ABDOMEN AND PELVIS WITHOUT CONTRAST TECHNIQUE: Multidetector CT imaging of the chest, abdomen and pelvis was performed following the standard protocol without IV contrast.  COMPARISON:  Chest radiograph dated 12/29/2019 and CT dated 01/20/2019. FINDINGS: Evaluation of this exam is limited in the  absence of intravenous contrast. CT CHEST FINDINGS Cardiovascular: Borderline cardiomegaly. No pericardial effusion. There is coronary vascular calcification. There is hypoattenuation of the cardiac blood pool suggestive of anemia. Clinical correlation is recommended. There is moderate atherosclerotic calcification of the thoracic aorta. No aneurysmal dilatation. There is dilatation of the main pulmonary trunk suggestive of a degree of pulmonary hypertension. Clinical correlation is recommended. Left-sided Port-A-Cath with tip at the cavoatrial junction. Mediastinum/Nodes: No definite hilar or mediastinal adenopathy. Evaluation however is limited in the absence of intravenous contrast. The esophagus and the thyroid gland are grossly unremarkable as visualized. No mediastinal fluid collection. Lungs/Pleura: Small bilateral pleural effusions. There are subsegmental bilateral lower lobe consolidative changes which may represent atelectasis or pneumonia. Similar appearance of a 5 mm nodular density in the right lower lobe (95/7). There is no pneumothorax. The central airways are patent. Musculoskeletal: Degenerative changes of the spine. No acute osseous pathology. CT ABDOMEN PELVIS FINDINGS No intra-abdominal free air or free fluid. Hepatobiliary: Several small hepatic hypodense lesions which are not characterized on this noncontrast CT but demonstrate fluid attenuation, likely cysts. These appear similar to prior CT. There is interval decrease in the size of the segment 4 low attenuating area now measuring approximately 2.2 x 4.3 cm (previously 4.2 x 7.0 cm). The gallbladder is unremarkable as visualized. Pancreas: The pancreas is grossly unremarkable. Spleen: Normal in size without focal abnormality. Adrenals/Urinary Tract: The adrenal glands unremarkable. There is no hydronephrosis or  nephrolithiasis on either side. Multiple bilateral renal cysts as seen previously. The visualized ureters and urinary bladder appear unremarkable. Stomach/Bowel: There is moderate stool throughout the colon. There is no bowel obstruction or active inflammation. The appendix is normal. Vascular/Lymphatic: Advanced aortoiliac atherosclerotic disease. The IVC is unremarkable. No portal venous gas. There is no adenopathy. Reproductive: Small partially calcified uterine fibroids. Other: Diffuse subcutaneous edema and anasarca new since the prior CT. Musculoskeletal: Degenerative changes.  No acute osseous pathology. IMPRESSION: 1. Small bilateral pleural effusions with subsegmental bilateral lower lobe consolidative changes which may represent atelectasis or pneumonia. Clinical correlation is recommended. This finding is new compared to prior CT. 2. Interval decrease in the size of the segment IV hepatic low attenuating area. 3. Anasarca, new since the prior CT. 4. Aortic Atherosclerosis (ICD10-I70.0). Electronically Signed   By: Anner Crete M.D.   On: 01/01/2020 23:17   CT CHEST WO CONTRAST  Result Date: 01/01/2020 CLINICAL DATA:  79 year old female with shortness of breath and productive cough. History of cholangiocarcinoma. EXAM: CT CHEST, ABDOMEN AND PELVIS WITHOUT CONTRAST TECHNIQUE: Multidetector CT imaging of the chest, abdomen and pelvis was performed following the standard protocol without IV contrast. COMPARISON:  Chest radiograph dated 12/29/2019 and CT dated 01/20/2019. FINDINGS: Evaluation of this exam is limited in the absence of intravenous contrast. CT CHEST FINDINGS Cardiovascular: Borderline cardiomegaly. No pericardial effusion. There is coronary vascular calcification. There is hypoattenuation of the cardiac blood pool suggestive of anemia. Clinical correlation is recommended. There is moderate atherosclerotic calcification of the thoracic aorta. No aneurysmal dilatation. There is dilatation  of the main pulmonary trunk suggestive of a degree of pulmonary hypertension. Clinical correlation is recommended. Left-sided Port-A-Cath with tip at the cavoatrial junction. Mediastinum/Nodes: No definite hilar or mediastinal adenopathy. Evaluation however is limited in the absence of intravenous contrast. The esophagus and the thyroid gland are grossly unremarkable as visualized. No mediastinal fluid collection. Lungs/Pleura: Small bilateral pleural effusions. There are subsegmental bilateral lower lobe consolidative changes which may represent atelectasis or pneumonia. Similar appearance of a  5 mm nodular density in the right lower lobe (95/7). There is no pneumothorax. The central airways are patent. Musculoskeletal: Degenerative changes of the spine. No acute osseous pathology. CT ABDOMEN PELVIS FINDINGS No intra-abdominal free air or free fluid. Hepatobiliary: Several small hepatic hypodense lesions which are not characterized on this noncontrast CT but demonstrate fluid attenuation, likely cysts. These appear similar to prior CT. There is interval decrease in the size of the segment 4 low attenuating area now measuring approximately 2.2 x 4.3 cm (previously 4.2 x 7.0 cm). The gallbladder is unremarkable as visualized. Pancreas: The pancreas is grossly unremarkable. Spleen: Normal in size without focal abnormality. Adrenals/Urinary Tract: The adrenal glands unremarkable. There is no hydronephrosis or nephrolithiasis on either side. Multiple bilateral renal cysts as seen previously. The visualized ureters and urinary bladder appear unremarkable. Stomach/Bowel: There is moderate stool throughout the colon. There is no bowel obstruction or active inflammation. The appendix is normal. Vascular/Lymphatic: Advanced aortoiliac atherosclerotic disease. The IVC is unremarkable. No portal venous gas. There is no adenopathy. Reproductive: Small partially calcified uterine fibroids. Other: Diffuse subcutaneous edema and  anasarca new since the prior CT. Musculoskeletal: Degenerative changes.  No acute osseous pathology. IMPRESSION: 1. Small bilateral pleural effusions with subsegmental bilateral lower lobe consolidative changes which may represent atelectasis or pneumonia. Clinical correlation is recommended. This finding is new compared to prior CT. 2. Interval decrease in the size of the segment IV hepatic low attenuating area. 3. Anasarca, new since the prior CT. 4. Aortic Atherosclerosis (ICD10-I70.0). Electronically Signed   By: Anner Crete M.D.   On: 01/01/2020 23:17   VAS [place] LOWER EXTREMITY VENOUS (DVT)  Result Date: 01/03/2020  Lower Venous DVTStudy Indications: Fever of unkown origin.  Comparison Study: No prior studies. Performing Technologist: Darlin Coco  Examination Guidelines: A complete evaluation includes B-mode imaging, spectral Doppler, color Doppler, and power Doppler as needed of all accessible portions of each vessel. Bilateral testing is considered an integral part of a complete examination. Limited examinations for reoccurring indications may be performed as noted. The reflux portion of the exam is performed with the patient in reverse Trendelenburg.  +---------+---------------+---------+-----------+----------+--------------+ RIGHT    CompressibilityPhasicitySpontaneityPropertiesThrombus Aging +---------+---------------+---------+-----------+----------+--------------+ CFV      Full           Yes      Yes                                 +---------+---------------+---------+-----------+----------+--------------+ SFJ      Full                                                        +---------+---------------+---------+-----------+----------+--------------+ FV Prox  Full                                                        +---------+---------------+---------+-----------+----------+--------------+ FV Mid   Full                                                         +---------+---------------+---------+-----------+----------+--------------+  FV DistalFull                                                        +---------+---------------+---------+-----------+----------+--------------+ PFV      Full                                                        +---------+---------------+---------+-----------+----------+--------------+ POP      Full           Yes      Yes                                 +---------+---------------+---------+-----------+----------+--------------+ PTV      Full                                                        +---------+---------------+---------+-----------+----------+--------------+ PERO     Full                                                        +---------+---------------+---------+-----------+----------+--------------+   +---------+---------------+---------+-----------+----------+--------------+ LEFT     CompressibilityPhasicitySpontaneityPropertiesThrombus Aging +---------+---------------+---------+-----------+----------+--------------+ CFV      Full           Yes      Yes                                 +---------+---------------+---------+-----------+----------+--------------+ SFJ      Full                                                        +---------+---------------+---------+-----------+----------+--------------+ FV Prox  Full                                                        +---------+---------------+---------+-----------+----------+--------------+ FV Mid   Full                                                        +---------+---------------+---------+-----------+----------+--------------+ FV DistalFull                                                        +---------+---------------+---------+-----------+----------+--------------+   PFV      Full                                                         +---------+---------------+---------+-----------+----------+--------------+ POP      Full           Yes      Yes                                 +---------+---------------+---------+-----------+----------+--------------+ PTV      Full                                                        +---------+---------------+---------+-----------+----------+--------------+ PERO     Full                                                        +---------+---------------+---------+-----------+----------+--------------+     Summary: RIGHT: - There is no evidence of deep vein thrombosis in the lower extremity.  - No cystic structure found in the popliteal fossa.  LEFT: - There is no evidence of deep vein thrombosis in the lower extremity.  - No cystic structure found in the popliteal fossa.  *See table(s) above for measurements and observations.    Preliminary     Assessment: 79 y.o.   1.  Acute on chronic diastolic congestive heart failure, improved. 2.  Fever of unknown origin  3.  Unresectable cholangiocarcinoma, on chemotherapy 4. Anemia secondary to chemo and cancer, s/p multiple blood transfusion  5. Acute on chronic CKD  6. HTN, DM    Plan:  -Pt has been afebrile today, she was on Tylenol frequently, last dose was mid last night.  I have reviewed her labs.  I discussed with ID Dr. Tommy Medal. If she remains to be afebrile, we will hold on port removal. If she does have recurrent fever, I think it's reasonable to remove her port, and pt is agreeable with that.  -She has had recurrent CHF exacerbation, not sure if it is related to chemo, although I have reduced her chemo infusion volume. -will postpone her next cycle chemo (scheduled for next Wednesday) for a week, to allow her recover adequately  -Her Doppler today was negative for DVT.  I discussed with her. -Echocardiogram was done today, report still pending. -If repeated hemoglobin less than 7.5 tomorrow, please give 1 unit of blood  transfusion with Lasix. -I will f/u as needed in house. Please call if there is any further questions.   [name]  01/03/2020    [name], MD 01/03/2020  6:38 PM

## 2020-01-03 NOTE — Progress Notes (Signed)
Pt taken for VAS [place] lower extremity.

## 2020-01-03 NOTE — Progress Notes (Signed)
Subjective: CC: Afebrile overnight. Denies CP, SOB, abdominal pain, n/v/d or dysuria. Notes reviewed.  Objective: Vital signs in last 24 hours: Temp:  [97.9 F (36.6 C)-99.4 F (37.4 C)] 98.6 F (37 C) (10/20 0455) Pulse Rate:  [65-75] 67 (10/20 0455) Resp:  [16-18] 16 (10/20 0455) BP: (120-144)/(53-68) 134/55 (10/20 0455) SpO2:  [93 %-97 %] 94 % (10/20 0455) Weight:  [57.4 kg] 57.4 kg (10/20 0455) Last BM Date: 12/28/19 (patient states she "doesn't go often")  Intake/Output from previous day: 10/19 0701 - 10/20 0700 In: 150 [P.O.:150] Out: 450 [Urine:450]  Notes normal urine output for her yesterday. Has not voided this AM as she just woke up. Intake/Output this shift: No intake/output data recorded.  PE: Gen:  Alert, NAD, pleasant Card:  RRR Pulm:  CTAB, no W/R/R, effort normal.  Chest wall: Port in place. No skin erythema, heat, induration, fluctuance or drainage. No signs of infection. Abd: Soft, NT/ND, +BS Ext:  No  LE edema  Psych: A&Ox3  Skin: no rashes noted, warm and dry  Lab Results:  Recent Labs    01/03/20 0309  WBC 9.6  HGB 7.2*  HCT 22.7*  PLT 103*   BMET Recent Labs    01/01/20 0327 01/03/20 0309  NA 134* 137  K 3.7 3.9  CL 101 104  CO2 23 22  GLUCOSE 102* 90  BUN 62* 61*  CREATININE 2.40* 2.05*  CALCIUM 8.1* 8.3*   PT/INR No results for input(s): LABPROT, INR in the last 72 hours. CMP     Component Value Date/Time   NA 137 01/03/2020 0309   NA 144 10/07/2017 1251   NA 144 11/19/2015 0921   K 3.9 01/03/2020 0309   K 4.8 11/19/2015 0921   CL 104 01/03/2020 0309   CO2 22 01/03/2020 0309   CO2 27 11/19/2015 0921   GLUCOSE 90 01/03/2020 0309   GLUCOSE 109 11/19/2015 0921   BUN 61 (H) 01/03/2020 0309   BUN 18 10/07/2017 1251   BUN 14.4 11/19/2015 0921   CREATININE 2.05 (H) 01/03/2020 0309   CREATININE 1.49 (H) 12/27/2019 0746   CREATININE 1.30 (H) 10/23/2019 1150   CREATININE 1.2 (H) 11/19/2015 0921   CALCIUM 8.3 (L)  01/03/2020 0309   CALCIUM 9.8 11/19/2015 0921   PROT 5.5 (L) 01/03/2020 0309   PROT 6.6 10/07/2017 1251   PROT 7.2 11/19/2015 0921   ALBUMIN 2.1 (L) 01/03/2020 0309   ALBUMIN 4.2 10/07/2017 1251   ALBUMIN 3.6 11/19/2015 0921   AST 21 01/03/2020 0309   AST 25 12/27/2019 0746   AST 20 11/19/2015 0921   ALT 12 01/03/2020 0309   ALT 8 12/27/2019 0746   ALT 14 11/19/2015 0921   ALKPHOS 61 01/03/2020 0309   ALKPHOS 59 11/19/2015 0921   BILITOT 0.9 01/03/2020 0309   BILITOT 0.6 12/27/2019 0746   BILITOT 0.41 11/19/2015 0921   GFRNONAA 22 (L) 01/03/2020 0309   GFRNONAA 33 (L) 12/27/2019 0746   GFRNONAA 39 (L) 10/23/2019 1150   GFRAA 31 (L) 12/13/2019 0812   GFRAA 45 (L) 10/23/2019 1150   Lipase  No results found for: LIPASE     Studies/Results: CT ABDOMEN PELVIS WO CONTRAST  Result Date: 01/01/2020 CLINICAL DATA:  79 year old female with shortness of breath and productive cough. History of cholangiocarcinoma. EXAM: CT CHEST, ABDOMEN AND PELVIS WITHOUT CONTRAST TECHNIQUE: Multidetector CT imaging of the chest, abdomen and pelvis was performed following the standard protocol without IV contrast. COMPARISON:  Chest radiograph dated 12/29/2019 and CT dated 01/20/2019. FINDINGS: Evaluation of this exam is limited in the absence of intravenous contrast. CT CHEST FINDINGS Cardiovascular: Borderline cardiomegaly. No pericardial effusion. There is coronary vascular calcification. There is hypoattenuation of the cardiac blood pool suggestive of anemia. Clinical correlation is recommended. There is moderate atherosclerotic calcification of the thoracic aorta. No aneurysmal dilatation. There is dilatation of the main pulmonary trunk suggestive of a degree of pulmonary hypertension. Clinical correlation is recommended. Left-sided Port-A-Cath with tip at the cavoatrial junction. Mediastinum/Nodes: No definite hilar or mediastinal adenopathy. Evaluation however is limited in the absence of intravenous  contrast. The esophagus and the thyroid gland are grossly unremarkable as visualized. No mediastinal fluid collection. Lungs/Pleura: Small bilateral pleural effusions. There are subsegmental bilateral lower lobe consolidative changes which may represent atelectasis or pneumonia. Similar appearance of a 5 mm nodular density in the right lower lobe (95/7). There is no pneumothorax. The central airways are patent. Musculoskeletal: Degenerative changes of the spine. No acute osseous pathology. CT ABDOMEN PELVIS FINDINGS No intra-abdominal free air or free fluid. Hepatobiliary: Several small hepatic hypodense lesions which are not characterized on this noncontrast CT but demonstrate fluid attenuation, likely cysts. These appear similar to prior CT. There is interval decrease in the size of the segment 4 low attenuating area now measuring approximately 2.2 x 4.3 cm (previously 4.2 x 7.0 cm). The gallbladder is unremarkable as visualized. Pancreas: The pancreas is grossly unremarkable. Spleen: Normal in size without focal abnormality. Adrenals/Urinary Tract: The adrenal glands unremarkable. There is no hydronephrosis or nephrolithiasis on either side. Multiple bilateral renal cysts as seen previously. The visualized ureters and urinary bladder appear unremarkable. Stomach/Bowel: There is moderate stool throughout the colon. There is no bowel obstruction or active inflammation. The appendix is normal. Vascular/Lymphatic: Advanced aortoiliac atherosclerotic disease. The IVC is unremarkable. No portal venous gas. There is no adenopathy. Reproductive: Small partially calcified uterine fibroids. Other: Diffuse subcutaneous edema and anasarca new since the prior CT. Musculoskeletal: Degenerative changes.  No acute osseous pathology. IMPRESSION: 1. Small bilateral pleural effusions with subsegmental bilateral lower lobe consolidative changes which may represent atelectasis or pneumonia. Clinical correlation is recommended. This  finding is new compared to prior CT. 2. Interval decrease in the size of the segment IV hepatic low attenuating area. 3. Anasarca, new since the prior CT. 4. Aortic Atherosclerosis (ICD10-I70.0). Electronically Signed   By: Anner Crete M.D.   On: 01/01/2020 23:17   CT CHEST WO CONTRAST  Result Date: 01/01/2020 CLINICAL DATA:  79 year old female with shortness of breath and productive cough. History of cholangiocarcinoma. EXAM: CT CHEST, ABDOMEN AND PELVIS WITHOUT CONTRAST TECHNIQUE: Multidetector CT imaging of the chest, abdomen and pelvis was performed following the standard protocol without IV contrast. COMPARISON:  Chest radiograph dated 12/29/2019 and CT dated 01/20/2019. FINDINGS: Evaluation of this exam is limited in the absence of intravenous contrast. CT CHEST FINDINGS Cardiovascular: Borderline cardiomegaly. No pericardial effusion. There is coronary vascular calcification. There is hypoattenuation of the cardiac blood pool suggestive of anemia. Clinical correlation is recommended. There is moderate atherosclerotic calcification of the thoracic aorta. No aneurysmal dilatation. There is dilatation of the main pulmonary trunk suggestive of a degree of pulmonary hypertension. Clinical correlation is recommended. Left-sided Port-A-Cath with tip at the cavoatrial junction. Mediastinum/Nodes: No definite hilar or mediastinal adenopathy. Evaluation however is limited in the absence of intravenous contrast. The esophagus and the thyroid gland are grossly unremarkable as visualized. No mediastinal fluid collection. Lungs/Pleura: Small bilateral pleural effusions. There  are subsegmental bilateral lower lobe consolidative changes which may represent atelectasis or pneumonia. Similar appearance of a 5 mm nodular density in the right lower lobe (95/7). There is no pneumothorax. The central airways are patent. Musculoskeletal: Degenerative changes of the spine. No acute osseous pathology. CT ABDOMEN PELVIS  FINDINGS No intra-abdominal free air or free fluid. Hepatobiliary: Several small hepatic hypodense lesions which are not characterized on this noncontrast CT but demonstrate fluid attenuation, likely cysts. These appear similar to prior CT. There is interval decrease in the size of the segment 4 low attenuating area now measuring approximately 2.2 x 4.3 cm (previously 4.2 x 7.0 cm). The gallbladder is unremarkable as visualized. Pancreas: The pancreas is grossly unremarkable. Spleen: Normal in size without focal abnormality. Adrenals/Urinary Tract: The adrenal glands unremarkable. There is no hydronephrosis or nephrolithiasis on either side. Multiple bilateral renal cysts as seen previously. The visualized ureters and urinary bladder appear unremarkable. Stomach/Bowel: There is moderate stool throughout the colon. There is no bowel obstruction or active inflammation. The appendix is normal. Vascular/Lymphatic: Advanced aortoiliac atherosclerotic disease. The IVC is unremarkable. No portal venous gas. There is no adenopathy. Reproductive: Small partially calcified uterine fibroids. Other: Diffuse subcutaneous edema and anasarca new since the prior CT. Musculoskeletal: Degenerative changes.  No acute osseous pathology. IMPRESSION: 1. Small bilateral pleural effusions with subsegmental bilateral lower lobe consolidative changes which may represent atelectasis or pneumonia. Clinical correlation is recommended. This finding is new compared to prior CT. 2. Interval decrease in the size of the segment IV hepatic low attenuating area. 3. Anasarca, new since the prior CT. 4. Aortic Atherosclerosis (ICD10-I70.0). Electronically Signed   By: Anner Crete M.D.   On: 01/01/2020 23:17    Anti-infectives: Anti-infectives (From admission, onward)   Start     Dose/Rate Route Frequency Ordered Stop   12/29/19 1915  cefTRIAXone (ROCEPHIN) 1 g in sodium chloride 0.9 % 100 mL IVPB  Status:  Discontinued        1 g 200 mL/hr  over 30 Minutes Intravenous Every 24 hours 12/29/19 1820 12/30/19 1741   12/29/19 1915  azithromycin (ZITHROMAX) 500 mg in sodium chloride 0.9 % 250 mL IVPB  Status:  Discontinued        500 mg 250 mL/hr over 60 Minutes Intravenous Every 24 hours 12/29/19 1820 12/30/19 1741   12/29/19 1915  cefTRIAXone (ROCEPHIN) 2 g in sodium chloride 0.9 % 100 mL IVPB  Status:  Discontinued        2 g 200 mL/hr over 30 Minutes Intravenous Every 24 hours 12/29/19 1827 12/29/19 1833   12/29/19 1915  azithromycin (ZITHROMAX) 500 mg in sodium chloride 0.9 % 250 mL IVPB  Status:  Discontinued        500 mg 250 mL/hr over 60 Minutes Intravenous Every 24 hours 12/29/19 1827 12/29/19 1833       Assessment/Plan HTN HLD DM2 CHF - admitted for excaberation 10/13 - Per [facility] -  Fever of unknown etiology Hx of Cholangiocarcinoma on Chemotherapy (last dose 10/13 per patient) followed by Dr. Burr Medico  S/p port placement by Dr. Barry Dienes 12/02/2018  - Fever workup per [facility] and ID has been unremarkable so far except possible PNA on CT (resp cx's w/ normal resp flora) - ID recommending port removal - Afebrile overnight. WBC 9.6. Not currently on abx  - Await input from Oncology - No clear indication its from her port but certainly can be removed as needed   FEN - HH VTE - Scds, lovenox  ID - none    LOS: 6 days    Jillyn Ledger , [facility] 01/03/2020, 7:33 AM Please see Amion for pager number during day hours 7:00am-4:30pm

## 2020-01-03 NOTE — Progress Notes (Signed)
Lower extremity venous bilateral study completed.   Please see CV Proc for preliminary results.   Arif Amendola, RDMS  

## 2020-01-04 DIAGNOSIS — I5033 Acute on chronic diastolic (congestive) heart failure: Secondary | ICD-10-CM | POA: Diagnosis not present

## 2020-01-04 DIAGNOSIS — R509 Fever, unspecified: Secondary | ICD-10-CM

## 2020-01-04 LAB — BASIC METABOLIC PANEL
Anion gap: 11 (ref 5–15)
BUN: 57 mg/dL — ABNORMAL HIGH (ref 8–23)
CO2: 23 mmol/L (ref 22–32)
Calcium: 8.2 mg/dL — ABNORMAL LOW (ref 8.9–10.3)
Chloride: 106 mmol/L (ref 98–111)
Creatinine, Ser: 2.02 mg/dL — ABNORMAL HIGH (ref 0.44–1.00)
GFR, Estimated: 23 mL/min — ABNORMAL LOW (ref >60)
Glucose, Bld: 125 mg/dL — ABNORMAL HIGH (ref 70–99)
Potassium: 3.9 mmol/L (ref 3.5–5.1)
Sodium: 140 mmol/L (ref 135–145)

## 2020-01-04 LAB — HEMOGLOBIN AND HEMATOCRIT, BLOOD
HCT: 28.3 % — ABNORMAL LOW (ref 36.0–46.0)
Hemoglobin: 9.3 g/dL — ABNORMAL LOW (ref 12.0–15.0)

## 2020-01-04 LAB — CBC
HCT: 22.6 % — ABNORMAL LOW (ref 36.0–46.0)
Hemoglobin: 7.2 g/dL — ABNORMAL LOW (ref 12.0–15.0)
MCH: 31 pg (ref 26.0–34.0)
MCHC: 31.9 g/dL (ref 30.0–36.0)
MCV: 97.4 fL (ref 80.0–100.0)
Platelets: 132 10*3/uL — ABNORMAL LOW (ref 150–400)
RBC: 2.32 MIL/uL — ABNORMAL LOW (ref 3.87–5.11)
RDW: 18.4 % — ABNORMAL HIGH (ref 11.5–15.5)
WBC: 7.4 10*3/uL (ref 4.0–10.5)
nRBC: 1.4 % — ABNORMAL HIGH (ref 0.0–0.2)

## 2020-01-04 LAB — GLUCOSE, CAPILLARY
Glucose-Capillary: 84 mg/dL (ref 70–99)
Glucose-Capillary: 171 mg/dL — ABNORMAL HIGH (ref 70–99)
Glucose-Capillary: 84 mg/dL (ref 70–99)
Glucose-Capillary: 145 mg/dL — ABNORMAL HIGH (ref 70–99)

## 2020-01-04 LAB — PREPARE RBC (CROSSMATCH)

## 2020-01-04 MED ORDER — DIPHENHYDRAMINE HCL 25 MG PO CAPS
25.0000 mg | ORAL_CAPSULE | Freq: Four times a day (QID) | ORAL | Status: DC | PRN
  Administered 2020-01-04: 25 mg via ORAL
  Filled 2020-01-04: qty 1

## 2020-01-04 MED ORDER — SODIUM CHLORIDE 0.9% IV SOLUTION: Freq: Once | INTRAVENOUS | Status: AC

## 2020-01-04 NOTE — Progress Notes (Signed)
Patient states last BM 10/14. Denies feeling constipated and states she does not want to take any additional laxative at this time. Passing flatus, abd soft to palpation and active bowel sounds noted.  Patient agreed to take PO senekot tonight. Will continue to monitor.

## 2020-01-04 NOTE — Progress Notes (Signed)
[facility]  Date of Admission:  12/27/2019     Total days of antibiotics 1         ASSESSMENT:  Donna May has remained afebrile without antipyretic medication. Agree with Oncology recommendations to monitor at this time without removal of the [facility] a Cath. If she does begin to have fevers again it would be recommended to remove it at that time while also obtaining blood cultures.  ID will sign off. Please re-consult if needed.   PLAN:  1. Continue to monitor off antibiotics. 2. Consider Port a Cath removal if she develops fevers again.   Principal Problem:   Acute on chronic diastolic CHF (congestive heart failure) (HCC) Active Problems:   Diabetes mellitus with renal complications (HCC)   Essential hypertension   Intrahepatic cholangiocarcinoma (HCC)   Acute respiratory failure with hypoxia (HCC)   CKD (chronic kidney disease), stage III (HCC)   Hypertensive urgency   Acute respiratory failure (HCC)   Acute on chronic congestive heart failure (HCC)   Fever   . amLODipine  10 mg Oral Daily  . docusate sodium  100 mg Oral Daily  . enoxaparin (LOVENOX) injection  30 mg Subcutaneous Q24H  . furosemide  40 mg Oral Daily  . hydrALAZINE  50 mg Oral Q8H  . insulin aspart  0-15 Units Subcutaneous TID WC  . irbesartan  300 mg Oral Daily  . loratadine  10 mg Oral Daily  . metoprolol succinate  100 mg Oral Daily  . pantoprazole  20 mg Oral Daily  . senna-docusate  1 tablet Oral QHS  . sodium chloride flush  3 mL Intravenous Q12H    SUBJECTIVE:   Afebrile overnight with no acute events. No new complaints.   No Known Allergies   Review of Systems: Review of Systems  Constitutional: Negative for chills, fever and weight loss.  Respiratory: Negative for cough, shortness of breath and wheezing.   Cardiovascular: Negative for chest pain and leg swelling.  Gastrointestinal: Negative for abdominal pain, constipation, diarrhea, nausea and  vomiting.  Skin: Negative for rash.      OBJECTIVE: Vitals:   01/04/20 0821 01/04/20 1122 01/04/20 1151 01/04/20 1446  BP: (!) 155/60 (!) 154/64 (!) 141/63 (!) 138/59  Pulse: 69 73 69 63  Resp: 18 18 18 17   Temp: 98.4 F (36.9 C) 98.5 F (36.9 C) 98.2 F (36.8 C) 98.7 F (37.1 C)  TempSrc: Oral Oral Oral Oral  SpO2: 100% 97% 98% 94%  Weight:      Height:       Body mass index is 21.13 kg/m.  Physical Exam Constitutional:      General: She is not in acute distress.    Appearance: She is well-developed.  Cardiovascular:     Rate and Rhythm: Normal rate and regular rhythm.     Heart sounds: Normal heart sounds.  Pulmonary:     Effort: Pulmonary effort is normal.     Breath sounds: Normal breath sounds.  Skin:    General: Skin is warm and dry.  Neurological:     Mental Status: She is alert and oriented to person, place, and time.  Psychiatric:        Behavior: Behavior normal.        Thought Content: Thought content normal.        Judgment: Judgment normal.     Lab Results Lab Results  Component Value Date   WBC 7.4 01/04/2020   HGB 7.2 (  L) 01/04/2020   HCT 22.6 (L) 01/04/2020   MCV 97.4 01/04/2020   PLT 132 (L) 01/04/2020    Lab Results  Component Value Date   CREATININE 2.02 (H) 01/04/2020   BUN 57 (H) 01/04/2020   NA 140 01/04/2020   K 3.9 01/04/2020   CL 106 01/04/2020   CO2 23 01/04/2020    Lab Results  Component Value Date   ALT 12 01/03/2020   AST 21 01/03/2020   ALKPHOS 61 01/03/2020   BILITOT 0.9 01/03/2020     Microbiology: Recent Results (from the past 240 hour(s))  Respiratory Panel by RT PCR (Flu A&B, Covid) - Nasopharyngeal Swab     Status: None   Collection Time: 12/27/19  8:50 PM   Specimen: Nasopharyngeal Swab  Result Value Ref Range Status   SARS Coronavirus 2 by RT PCR NEGATIVE NEGATIVE Final    Comment: (NOTE) SARS-CoV-2 target nucleic acids are NOT DETECTED.  The SARS-CoV-2 RNA is generally detectable in upper  respiratoy specimens during the acute phase of infection. The lowest concentration of SARS-CoV-2 viral copies this assay can detect is 131 copies/mL. A negative result does not preclude SARS-Cov-2 infection and should not be used as the sole basis for treatment or other patient management decisions. A negative result may occur with  improper specimen collection/handling, submission of specimen other than nasopharyngeal swab, presence of viral mutation(s) within the areas targeted by this assay, and inadequate number of viral copies (<131 copies/mL). A negative result must be combined with clinical observations, patient history, and epidemiological information. The expected result is Negative.  Fact Sheet for Patients:  PinkCheek.be  Fact Sheet for Healthcare Providers:  GravelBags.it  This test is no t yet approved or cleared by the [place] FDA and  has been authorized for detection and/or diagnosis of SARS-CoV-2 by FDA under an Emergency Use Authorization (EUA). This EUA will remain  in effect (meaning this test can be used) for the duration of the COVID-19 declaration under Section 564(b)(1) of the Act, 21 U.S.C. section 360bbb-3(b)(1), unless the authorization is terminated or revoked sooner.     Influenza A by PCR NEGATIVE NEGATIVE Final   Influenza B by PCR NEGATIVE NEGATIVE Final    Comment: (NOTE) The Xpert Xpress SARS-CoV-2/FLU/RSV assay is intended as an aid in  the diagnosis of influenza from Nasopharyngeal swab specimens and  should not be used as a sole basis for treatment. Nasal washings and  aspirates are unacceptable for Xpert Xpress SARS-CoV-2/FLU/RSV  testing.  Fact Sheet for Patients: PinkCheek.be  Fact Sheet for Healthcare Providers: GravelBags.it  This test is not yet approved or cleared by the [place] FDA and  has been  authorized for detection and/or diagnosis of SARS-CoV-2 by  FDA under an Emergency Use Authorization (EUA). This EUA will remain  in effect (meaning this test can be used) for the duration of the  Covid-19 declaration under Section 564(b)(1) of the Act, 21  U.S.C. section 360bbb-3(b)(1), unless the authorization is  terminated or revoked. Performed at [facility], [place] [address] [place], [place] [address]   Culture, blood (routine x 2)     Status: None   Collection Time: 12/29/19  8:29 AM   Specimen: BLOOD LEFT HAND  Result Value Ref Range Status   Specimen Description BLOOD LEFT HAND  Final   Special Requests   Final    BOTTLES DRAWN AEROBIC ONLY Blood Culture adequate volume   Culture   Final    NO  GROWTH 5 DAYS Performed at [facility], [place] [address] [place], [place] [address]    Report Status 01/03/2020 FINAL  Final  Culture, blood (routine x 2)     Status: None   Collection Time: 12/29/19 10:22 AM   Specimen: BLOOD LEFT HAND  Result Value Ref Range Status   Specimen Description BLOOD LEFT HAND  Final   Special Requests   Final    BOTTLES DRAWN AEROBIC ONLY Blood Culture adequate volume   Culture   Final    NO GROWTH 5 DAYS Performed at [facility], [place] [address], [place], [place] [address]    Report Status 01/03/2020 FINAL  Final  Respiratory Panel by PCR     Status: None   Collection Time: 12/29/19  9:36 PM   Specimen: Nasopharyngeal Swab; Respiratory  Result Value Ref Range Status   Adenovirus NOT DETECTED NOT DETECTED Final   Coronavirus 229E NOT DETECTED NOT DETECTED Final    Comment: (NOTE) The Coronavirus on the Respiratory Panel, DOES NOT test for the novel  Coronavirus (2019 nCoV)    Coronavirus HKU1 NOT DETECTED NOT DETECTED Final   Coronavirus NL63 NOT DETECTED NOT DETECTED Final   Coronavirus OC43 NOT DETECTED NOT DETECTED Final   Metapneumovirus NOT DETECTED NOT DETECTED Final   Rhinovirus / Enterovirus NOT DETECTED NOT DETECTED  Final   Influenza A NOT DETECTED NOT DETECTED Final   Influenza B NOT DETECTED NOT DETECTED Final   Parainfluenza Virus 1 NOT DETECTED NOT DETECTED Final   Parainfluenza Virus 2 NOT DETECTED NOT DETECTED Final   Parainfluenza Virus 3 NOT DETECTED NOT DETECTED Final   Parainfluenza Virus 4 NOT DETECTED NOT DETECTED Final   Respiratory Syncytial Virus NOT DETECTED NOT DETECTED Final   Bordetella pertussis NOT DETECTED NOT DETECTED Final   Chlamydophila pneumoniae NOT DETECTED NOT DETECTED Final   Mycoplasma pneumoniae NOT DETECTED NOT DETECTED Final    Comment: Performed at [facility], [place]. [address], [place], [place] [address]  Sputum culture     Status: None   Collection Time: 12/30/19  3:52 AM   Specimen: Expectorated Sputum  Result Value Ref Range Status   Specimen Description EXPECTORATED SPUTUM  Final   Special Requests NONE  Final   Sputum evaluation   Final    THIS SPECIMEN IS ACCEPTABLE FOR SPUTUM CULTURE Performed at [facility], [place] [address], [place], [place] [address]    Report Status 12/30/2019 FINAL  Final  Culture, respiratory     Status: None   Collection Time: 12/30/19  3:52 AM  Result Value Ref Range Status   Specimen Description EXPECTORATED SPUTUM  Final   Special Requests NONE Reflexed from [id number]  Final   Gram Stain   Final    MODERATE WBC PRESENT, PREDOMINANTLY PMN FEW SQUAMOUS EPITHELIAL CELLS PRESENT ABUNDANT GRAM POSITIVE COCCI MODERATE GRAM NEGATIVE RODS MODERATE GRAM POSITIVE RODS    Culture   Final    Normal respiratory flora-no Staph aureus or Pseudomonas seen Performed at [facility], [place] [address] [place], [place] [address]    Report Status 01/01/2020 FINAL  Final     Terri Piedra, NP [facility]  01/04/2020  3:40 PM

## 2020-01-04 NOTE — Progress Notes (Signed)
Occupational Therapy Treatment Patient Details Name: Donna May MRN: [id number] DOB: [birth year] Today's Date: 01/04/2020    History of present illness Patient is a 79 y/o female who presents with SOB, LE edema. Admitted with CHF exacerbation and HTN urgency. CXR-pukmonary edema. PMH includes DM, CKD III, anemia and cholangiocarcinoma.   OT comments  Pt seen for OT follow up session with focus on ADL AE. Pt has difficulty manipulating ADL items due to neuropathy in hands. Issued pt blue and red built up tubing for comb, utensils, writing utensils, etc. Pt trialed use of comb and spoon with built up foam and voiced increased comfort and stability with manipulation. Also issued pt elastic shoe strings for increased independence in LB dressing. Educated pt on how they are laced to prevent retying shoes often. Pt remained in bed at end of session due to upcoming blood transfusion. Will continue to follow.    Follow Up Recommendations  Home health OT;Supervision - Intermittent    Equipment Recommendations  None recommended by OT    Recommendations for Other Services      Precautions / Restrictions Precautions Precautions: Fall Precaution Comments: neuropathy hands/feet in hand/sfeet Restrictions Weight Bearing Restrictions: No       Mobility Bed Mobility Overal bed mobility: Modified Independent                Transfers                      Balance Overall balance assessment: Mild deficits observed, not formally tested                                         ADL either performed or assessed with clinical judgement   ADL   Eating/Feeding: Set up;Sitting Eating/Feeding Details (indicate cue type and reason): used red utensils for self feeding Grooming: Set up;Sitting Grooming Details (indicate cue type and reason): used blue foam on comb                               General ADL Comments: educated pt on use of blue and red  built up foam for ADL utensils due to difficulty grasping from hx of neuropathy. Pt trialed built up hair brush, dining utensils, and writing utensil     Vision Baseline Vision/History: Wears glasses Wears Glasses: At all times Patient Visual Report: No change from baseline     Perception     Praxis      Cognition Arousal/Alertness: Awake/alert Behavior During Therapy: WFL for tasks assessed/performed Overall Cognitive Status: No family/caregiver present to determine baseline cognitive functioning Area of Impairment: Memory;Safety/judgement                     Memory: Decreased short-term memory   Safety/Judgement: Decreased awareness of safety     General Comments: most likely at baseline        Exercises     Shoulder Instructions       General Comments      Pertinent Vitals/ Pain       Pain Assessment: Faces Faces Pain Scale: Hurts a little bit Pain Location: feet Pain Descriptors / Indicators: Tingling;Pins and needles Pain Intervention(s): Monitored during session  Home Living  Prior Functioning/Environment              Frequency  Min 2X/week        Progress Toward Goals  OT Goals(current goals can now be found in the care plan section)  Progress towards OT goals: Progressing toward goals  Acute Rehab OT Goals Patient Stated Goal: to go home OT Goal Formulation: With patient Time For Goal Achievement: 01/14/20 Potential to Achieve Goals: Good  Plan Discharge plan remains appropriate    Co-evaluation                 AM-PAC OT "6 Clicks" Daily Activity     Outcome Measure   Help from another person eating meals?: A Little Help from another person taking care of personal grooming?: A Little Help from another person toileting, which includes using toliet, bedpan, or urinal?: A Little Help from another person bathing (including washing, rinsing, drying)?: A  Little Help from another person to put on and taking off regular upper body clothing?: A Little Help from another person to put on and taking off regular lower body clothing?: A Little 6 Click Score: 18    End of Session Equipment Utilized During Treatment: Gait belt;Rolling walker  OT Visit Diagnosis: Unsteadiness on feet (R26.81);Other abnormalities of gait and mobility (R26.89);Muscle weakness (generalized) (M62.81);Pain Pain - part of body: Ankle and joints of foot   Activity Tolerance Patient tolerated treatment well   Patient Left in bed;with call bell/phone within reach   Nurse Communication Mobility status        Time: 4628-6381 OT Time Calculation (min): 10 min  Charges: OT General Charges $OT Visit: 1 Visit OT Treatments $Self Care/Home Management : 8-22 mins  .Zenovia Jarred, MSOT, OTR/L Acute Rehabilitation Services [facility] Office Number: [phone] Pager: [phone]  Zenovia Jarred 01/04/2020, 5:36 PM

## 2020-01-04 NOTE — Progress Notes (Signed)
NAEO. Overall feeling better.  Afebrile, BCx from 10/15 remain negative, appreciate Dr. Burr Medico seeing the patient. Plan to leave port in place and consider removal for recurrent fever.   CCS will sign off, please call as needed.   Obie Dredge, [facility]

## 2020-01-04 NOTE — Progress Notes (Signed)
PT Cancellation Note  Patient Details Name: KHALEA VENTURA MRN: [id number] DOB: [birth year]   Cancelled Treatment:    Reason Eval/Treat Not Completed: (P) Medical issues which prohibited therapy (Pt finishing blood transfusion and eating lunch, will return per POC.)   Darene Nappi Eli Hose 01/04/2020, 2:43 PM  Erasmo Leventhal , PTA Acute Rehabilitation Services Pager [phone] Office [phone]

## 2020-01-04 NOTE — Progress Notes (Signed)
PROGRESS NOTE    Donna May  KGM:[id number] DOB: 1940-06-25 DOA: 12/27/2019 PCP: [name], MD     Brief Narrative:  Donna May is an 79 yr old female who presented to [facility] ED on 12/27/2019 with complaints of shortness of breath. She had chemotherapy for cholangiocarcinoma earlier in the day. She also had increased lower extremity edema. She has chronic edema of the right arm due to prior mastectomy for BRCA in the 1990's.  In the ED her CXR demonstrated pulmonary edema and she had a BNP of 2301.  [facility] hospitalists had been consulted to admit the patient for further evaluation and care.  The patient had fevers of 100, 101, 100.4, 101.3 on 12/29/2019. Then she had fevers of 101.6 and 102.8 on the evening of 12/30/2019, 101.6 and 100.8 on the morning of 12/31/2019, and 101.2 on the morning of 01/01/2020. No source of infection has been identified. CXR was re-evaluated and demonstrated clearing of the pulmonary edema. It demonstrated no new acute abnormalities. CT chest, abdomen, and pelvis has demonstrated atelectasis vs infiltrate in the lungs, although this seems an unlikely source of the fevers. UA is negative, and the patient has had no complaints of dysuria. Infectious disease recommended removal of the port. General surgery has also been consulted. She had another fever this morning.  The patient has been evaluated by PT/OT. They have recommended discharge to home with home health PT/OT.  New events last 24 hours / Subjective: No new complaints, has not received Tylenol in over 24 hours.  Has remained afebrile.  Hemoglobin 7.2 today, plan for blood transfusion per oncology recommendations.  Patient consented to blood transfusion today.     Assessment & Plan:   Principal Problem:   Acute on chronic diastolic CHF (congestive heart failure) (HCC) Active Problems:   Diabetes mellitus with renal complications (HCC)   Essential hypertension   Intrahepatic  cholangiocarcinoma (HCC)   Acute respiratory failure with hypoxia (HCC)   CKD (chronic kidney disease), stage III (HCC)   Hypertensive urgency   Acute respiratory failure (HCC)   Acute on chronic congestive heart failure (HCC)   Fever   Acute on chronic diastolic CHF -EF 60 to 64%, grade 3 diastolic dysfunction -Patient was diuresed with Lasix. Continue Avapro, Lasix, Toprol, Norvasc, hydralazine -Resolved   Fever of unknown origin -Initially concern for pneumonia and patient was started on Rocephin and azithromycin.  Repeat chest x-ray was negative.  Antibiotics were stopped. CT chest, abdomen, and pelvis has demonstrated atelectasis vs infiltrate in the lungs, although this seems an unlikely source of the fevers -Respiratory viral panel negative -Respiratory culture negative -Blood cultures negative -UA negative -Infectious disease following -Echocardiogram without evidence of endocarditis -Venous dopplers to rule out DVT as source of fever -negative -Continue to watch fever curve, leave port in place for now  Cholangiocarcinoma chemotherapy -Followed by Dr. Burr Medico   HTN -Continue Avapro, Lasix, Toprol, Norvasc, hydralazine  Acute Kidney Injury on CKD stage 4 -Baseline creatinine around 1.5 -Creatinine remains stable  DMII -Sliding scale insulin  Symptomatic anemia -1 unit packed red blood cell transfusion today    DVT prophylaxis:  enoxaparin (LOVENOX) injection 30 mg Start: 12/28/19 0800  Code Status: Full code Family Communication: No family at bedside Disposition Plan:  Status is: Inpatient  Remains inpatient appropriate because:IV treatments appropriate due to intensity of illness or inability to take PO   Dispo: The patient is from: Home  Anticipated d/c is to: Home              Anticipated d/c date is: 1 day              Patient currently is not medically stable to d/c.  Continue to monitor fever curve off antibiotics and off  antipyretic.  Blood transfusion ordered today.   Consultants:   General surgery  Infectious disease  Oncology   Antimicrobials:  Anti-infectives (From admission, onward)   Start     Dose/Rate Route Frequency Ordered Stop   12/29/19 1915  cefTRIAXone (ROCEPHIN) 1 g in sodium chloride 0.9 % 100 mL IVPB  Status:  Discontinued        1 g 200 mL/hr over 30 Minutes Intravenous Every 24 hours 12/29/19 1820 12/30/19 1741   12/29/19 1915  azithromycin (ZITHROMAX) 500 mg in sodium chloride 0.9 % 250 mL IVPB  Status:  Discontinued        500 mg 250 mL/hr over 60 Minutes Intravenous Every 24 hours 12/29/19 1820 12/30/19 1741   12/29/19 1915  cefTRIAXone (ROCEPHIN) 2 g in sodium chloride 0.9 % 100 mL IVPB  Status:  Discontinued        2 g 200 mL/hr over 30 Minutes Intravenous Every 24 hours 12/29/19 1827 12/29/19 1833   12/29/19 1915  azithromycin (ZITHROMAX) 500 mg in sodium chloride 0.9 % 250 mL IVPB  Status:  Discontinued        500 mg 250 mL/hr over 60 Minutes Intravenous Every 24 hours 12/29/19 1827 12/29/19 1833       Objective: Vitals:   01/03/20 2050 01/03/20 2102 01/04/20 0400 01/04/20 0821  BP: (!) 136/59  (!) 141/63 (!) 155/60  Pulse: 69  66 69  Resp: _0 Temp: 99.2 F (37.3 C)  98.7 F (37.1 C) 98.4 F (36.9 C)  TempSrc: Oral  Oral Oral  SpO2: 94% 94% 93% 100%  Weight:   57.6 kg   Height:        Intake/Output Summary (Last 24 hours) at 01/04/2020 1036 Last data filed at 01/04/2020 1000 Gross per 24 hour  Intake 240 ml  Output 500 ml  Net -260 ml   Filed Weights   01/02/20 0508 01/03/20 0455 01/04/20 0400  Weight: 58.1 kg 57.4 kg 57.6 kg    Examination: General exam: Appears calm and comfortable  Respiratory system: Clear to auscultation. Respiratory effort normal. Cardiovascular system: S1 & S2 heard, RRR. No pedal edema. Gastrointestinal system: Abdomen is nondistended, soft and nontender. Normal bowel sounds heard. Central nervous system: Alert  and oriented. Non focal exam. Speech clear  Extremities: Symmetric in appearance bilaterally  Skin: No rashes, lesions or ulcers on exposed skin  Psychiatry: Judgement and insight appear stable. Mood & affect appropriate.    Data Reviewed: I have personally reviewed following labs and imaging studies  CBC: Recent Labs  Lab 12/29/19 0219 01/03/20 0309 01/04/20 0120  WBC 8.5 9.6 7.4  NEUTROABS 7.4 8.1*  --   HGB 9.5* 7.2* 7.2*  HCT 29.4* 22.7* 22.6*  MCV 97.0 97.0 97.4  PLT 160 103* 272*   Basic Metabolic Panel: Recent Labs  Lab 12/30/19 0131 12/31/19 0155 01/01/20 0327 01/03/20 0309 01/04/20 0120  NA 138 138 134* 137 140  K 3.4* 4.2 3.7 3.9 3.9  CL 102 104 101 104 106  CO2 25 21* _1 GLUCOSE 111* 95 102* 90 125*  BUN 51* 54* 62* 61* 57*  CREATININE 1.97*  2.19* 2.40* 2.05* 2.02*  CALCIUM 8.4* 8.3* 8.1* 8.3* 8.2*   GFR: Estimated Creatinine Clearance: 20 mL/min (A) (by C-G formula based on SCr of 2.02 mg/dL (H)). Liver Function Tests: Recent Labs  Lab 01/03/20 0309  AST 21  ALT 12  ALKPHOS 61  BILITOT 0.9  PROT 5.5*  ALBUMIN 2.1*   No results for input(s): LIPASE, AMYLASE in the last 168 hours. No results for input(s): AMMONIA in the last 168 hours. Coagulation Profile: No results for input(s): INR, PROTIME in the last 168 hours. Cardiac Enzymes: No results for input(s): CKTOTAL, CKMB, CKMBINDEX, TROPONINI in the last 168 hours. BNP (last 3 results) No results for input(s): PROBNP in the last 8760 hours. HbA1C: No results for input(s): HGBA1C in the last 72 hours. CBG: Recent Labs  Lab 01/03/20 0746 01/03/20 1137 01/03/20 1642 01/03/20 2059 01/04/20 0750  GLUCAP 84 106* 89 162* 84   Lipid Profile: No results for input(s): CHOL, HDL, LDLCALC, TRIG, CHOLHDL, LDLDIRECT in the last 72 hours. Thyroid Function Tests: No results for input(s): TSH, T4TOTAL, FREET4, T3FREE, THYROIDAB in the last 72 hours. Anemia Panel: No results for input(s):  VITAMINB12, FOLATE, FERRITIN, TIBC, IRON, RETICCTPCT in the last 72 hours. Sepsis Labs: No results for input(s): PROCALCITON, LATICACIDVEN in the last 168 hours.  Recent Results (from the past 240 hour(s))  Respiratory Panel by RT PCR (Flu A&B, Covid) - Nasopharyngeal Swab     Status: None   Collection Time: 12/27/19  8:50 PM   Specimen: Nasopharyngeal Swab  Result Value Ref Range Status   SARS Coronavirus 2 by RT PCR NEGATIVE NEGATIVE Final    Comment: (NOTE) SARS-CoV-2 target nucleic acids are NOT DETECTED.  The SARS-CoV-2 RNA is generally detectable in upper respiratoy specimens during the acute phase of infection. The lowest concentration of SARS-CoV-2 viral copies this assay can detect is 131 copies/mL. A negative result does not preclude SARS-Cov-2 infection and should not be used as the sole basis for treatment or other patient management decisions. A negative result may occur with  improper specimen collection/handling, submission of specimen other than nasopharyngeal swab, presence of viral mutation(s) within the areas targeted by this assay, and inadequate number of viral copies (<131 copies/mL). A negative result must be combined with clinical observations, patient history, and epidemiological information. The expected result is Negative.  Fact Sheet for Patients:  PinkCheek.be  Fact Sheet for Healthcare Providers:  GravelBags.it  This test is no t yet approved or cleared by the [place] FDA and  has been authorized for detection and/or diagnosis of SARS-CoV-2 by FDA under an Emergency Use Authorization (EUA). This EUA will remain  in effect (meaning this test can be used) for the duration of the COVID-19 declaration under Section 564(b)(1) of the Act, 21 U.S.C. section 360bbb-3(b)(1), unless the authorization is terminated or revoked sooner.     Influenza A by PCR NEGATIVE NEGATIVE Final   Influenza B  by PCR NEGATIVE NEGATIVE Final    Comment: (NOTE) The Xpert Xpress SARS-CoV-2/FLU/RSV assay is intended as an aid in  the diagnosis of influenza from Nasopharyngeal swab specimens and  should not be used as a sole basis for treatment. Nasal washings and  aspirates are unacceptable for Xpert Xpress SARS-CoV-2/FLU/RSV  testing.  Fact Sheet for Patients: PinkCheek.be  Fact Sheet for Healthcare Providers: GravelBags.it  This test is not yet approved or cleared by the [place] FDA and  has been authorized for detection and/or diagnosis of SARS-CoV-2 by  FDA under  an Emergency Use Authorization (EUA). This EUA will remain  in effect (meaning this test can be used) for the duration of the  Covid-19 declaration under Section 564(b)(1) of the Act, 21  U.S.C. section 360bbb-3(b)(1), unless the authorization is  terminated or revoked. Performed at [facility], [place] [address], [place], [place] [address]   Culture, blood (routine x 2)     Status: None   Collection Time: 12/29/19  8:29 AM   Specimen: BLOOD LEFT HAND  Result Value Ref Range Status   Specimen Description BLOOD LEFT HAND  Final   Special Requests   Final    BOTTLES DRAWN AEROBIC ONLY Blood Culture adequate volume   Culture   Final    NO GROWTH 5 DAYS Performed at [facility], [place] [address] [place], [place] [address]    Report Status 01/03/2020 FINAL  Final  Culture, blood (routine x 2)     Status: None   Collection Time: 12/29/19 10:22 AM   Specimen: BLOOD LEFT HAND  Result Value Ref Range Status   Specimen Description BLOOD LEFT HAND  Final   Special Requests   Final    BOTTLES DRAWN AEROBIC ONLY Blood Culture adequate volume   Culture   Final    NO GROWTH 5 DAYS Performed at [facility], [place] [address] [place], [place] [address]    Report Status 01/03/2020 FINAL  Final  Respiratory Panel by PCR     Status: None   Collection Time:  12/29/19  9:36 PM   Specimen: Nasopharyngeal Swab; Respiratory  Result Value Ref Range Status   Adenovirus NOT DETECTED NOT DETECTED Final   Coronavirus 229E NOT DETECTED NOT DETECTED Final    Comment: (NOTE) The Coronavirus on the Respiratory Panel, DOES NOT test for the novel  Coronavirus (2019 nCoV)    Coronavirus HKU1 NOT DETECTED NOT DETECTED Final   Coronavirus NL63 NOT DETECTED NOT DETECTED Final   Coronavirus OC43 NOT DETECTED NOT DETECTED Final   Metapneumovirus NOT DETECTED NOT DETECTED Final   Rhinovirus / Enterovirus NOT DETECTED NOT DETECTED Final   Influenza A NOT DETECTED NOT DETECTED Final   Influenza B NOT DETECTED NOT DETECTED Final   Parainfluenza Virus 1 NOT DETECTED NOT DETECTED Final   Parainfluenza Virus 2 NOT DETECTED NOT DETECTED Final   Parainfluenza Virus 3 NOT DETECTED NOT DETECTED Final   Parainfluenza Virus 4 NOT DETECTED NOT DETECTED Final   Respiratory Syncytial Virus NOT DETECTED NOT DETECTED Final   Bordetella pertussis NOT DETECTED NOT DETECTED Final   Chlamydophila pneumoniae NOT DETECTED NOT DETECTED Final   Mycoplasma pneumoniae NOT DETECTED NOT DETECTED Final    Comment: Performed at [facility], [place]. [address] [place], [place] [address]  Sputum culture     Status: None   Collection Time: 12/30/19  3:52 AM   Specimen: Expectorated Sputum  Result Value Ref Range Status   Specimen Description EXPECTORATED SPUTUM  Final   Special Requests NONE  Final   Sputum evaluation   Final    THIS SPECIMEN IS ACCEPTABLE FOR SPUTUM CULTURE Performed at [facility], [place] [address], [place], [place] [address]    Report Status 12/30/2019 FINAL  Final  Culture, respiratory     Status: None   Collection Time: 12/30/19  3:52 AM  Result Value Ref Range Status   Specimen Description EXPECTORATED SPUTUM  Final   Special Requests NONE Reflexed from [id number]  Final   Gram  Stain   Final    MODERATE WBC PRESENT, PREDOMINANTLY PMN FEW SQUAMOUS  EPITHELIAL CELLS PRESENT ABUNDANT GRAM POSITIVE COCCI MODERATE GRAM NEGATIVE RODS MODERATE GRAM POSITIVE RODS    Culture   Final    Normal respiratory flora-no Staph aureus or Pseudomonas seen Performed at [facility], [address], [place], [place] [address]    Report Status 01/01/2020 FINAL  Final      Radiology Studies: ECHOCARDIOGRAM COMPLETE  Result Date: 01/03/2020    ECHOCARDIOGRAM REPORT   Patient Name:   Donna May Date of Exam: 01/03/2020 Medical Rec #:  [id number]     Height:       65.0 in Accession #:    [id number]    Weight:       126.5 lb Date of Birth:  1940/03/23    BSA:          1.628 m Patient Age:    57 years      BP:           137/56 mmHg Patient Gender: F             HR:           68 bpm. Exam Location:  Inpatient Procedure: 2D Echo, Cardiac Doppler and Color Doppler Indications:    Fever  History:        Patient has prior history of Echocardiogram examinations, most                 recent 10/19/2019. CHF; Risk Factors:Diabetes, Hypertension,                 Former Smoker and Dyslipidemia. CKD.  Sonographer:    Clayton Lefort RDCS (AE) Referring Phys: [id number] Sevrin Sally IMPRESSIONS  1. Left ventricular ejection fraction, by estimation, is 60 to 65%. The left ventricle has normal function. The left ventricle has no regional wall motion abnormalities. There is moderate concentric left ventricular hypertrophy. Left ventricular diastolic parameters are consistent with Grade III diastolic dysfunction (restrictive).  2. Right ventricular systolic function is normal. The right ventricular size is normal. There is severely elevated pulmonary artery systolic pressure.  3. Left atrial size was severely dilated.  4. Right atrial size was moderately dilated.  5. A small pericardial effusion is present. The pericardial effusion is circumferential.  6. The mitral valve is normal in structure. Mild mitral valve regurgitation.  7. Tricuspid valve regurgitation is moderate.  8. The  aortic valve is tricuspid. Aortic valve regurgitation is not visualized. No aortic stenosis is present.  9. The inferior vena cava is dilated in size with >50% respiratory variability, suggesting right atrial pressure of 8 mmHg. Comparison(s): No prior Echocardiogram. Conclusion(s)/Recommendation(s): Severely elevated right sided filling pressures, grade 3 diastilic dysfunction. FINDINGS  Left Ventricle: Myocardial appearance suggests possibility of amyloid. Left ventricular ejection fraction, by estimation, is 60 to 65%. The left ventricle has normal function. The left ventricle has no regional wall motion abnormalities. The left ventricular internal cavity size was normal in size. There is moderate concentric left ventricular hypertrophy. Left ventricular diastolic parameters are consistent with Grade III diastolic dysfunction (restrictive). Right Ventricle: The right ventricular size is normal. No increase in right ventricular wall thickness. Right ventricular systolic function is normal. There is severely elevated pulmonary artery systolic pressure. The tricuspid regurgitant velocity is 4.10 m/s, and with an assumed right atrial pressure of 8 mmHg, the estimated right ventricular systolic pressure is 93.7 mmHg. Left Atrium: Left atrial size was severely  dilated. Right Atrium: Right atrial size was moderately dilated. Pericardium: A small pericardial effusion is present. The pericardial effusion is circumferential. Mitral Valve: The mitral valve is normal in structure. There is mild thickening of the mitral valve leaflet(s). There is mild calcification of the mitral valve leaflet(s). Mild mitral valve regurgitation. MV peak gradient, 4.6 mmHg. The mean mitral valve  gradient is 1.0 mmHg. Tricuspid Valve: The tricuspid valve is normal in structure. Tricuspid valve regurgitation is moderate. Aortic Valve: The aortic valve is tricuspid. Aortic valve regurgitation is not visualized. No aortic stenosis is present.  Aortic valve mean gradient measures 3.0 mmHg. Aortic valve peak gradient measures 6.1 mmHg. Aortic valve area, by VTI measures 2.53 cm. Pulmonic Valve: The pulmonic valve was grossly normal. Pulmonic valve regurgitation is trivial. No evidence of pulmonic stenosis. Aorta: The aortic root and ascending aorta are structurally normal, with no evidence of dilitation. Venous: The inferior vena cava is dilated in size with greater than 50% respiratory variability, suggesting right atrial pressure of 8 mmHg. IAS/Shunts: The atrial septum is grossly normal.  LEFT VENTRICLE PLAX 2D LVIDd:         3.90 cm  Diastology LVIDs:         2.60 cm  LV e' medial:    6.42 cm/s LV PW:         1.70 cm  LV E/e' medial:  14.8 LV IVS:        1.60 cm  LV e' lateral:   9.36 cm/s LVOT diam:     2.00 cm  LV E/e' lateral: 10.1 LV SV:         63 LV SV Index:   38 LVOT Area:     3.14 cm  RIGHT VENTRICLE             IVC RV Basal diam:  3.70 cm     IVC diam: 2.10 cm RV S prime:     11.40 cm/s TAPSE (M-mode): 1.9 cm LEFT ATRIUM             Index       RIGHT ATRIUM           Index LA diam:        4.60 cm 2.83 cm/m  RA Area:     18.30 cm LA Vol (A2C):   96.5 ml 59.27 ml/m RA Volume:   50.20 ml  30.83 ml/m LA Vol (A4C):   80.5 ml 49.44 ml/m LA Biplane Vol: 96.2 ml 59.08 ml/m  AORTIC VALVE AV Area (Vmax):    2.58 cm AV Area (Vmean):   2.41 cm AV Area (VTI):     2.53 cm AV Vmax:           123.00 cm/s AV Vmean:          83.000 cm/s AV VTI:            0.247 m AV Peak Grad:      6.1 mmHg AV Mean Grad:      3.0 mmHg LVOT Vmax:         101.00 cm/s LVOT Vmean:        63.700 cm/s LVOT VTI:          0.199 m LVOT/AV VTI ratio: 0.81  AORTA Ao Root diam: 3.20 cm Ao Asc diam:  2.90 cm MITRAL VALVE               TRICUSPID VALVE MV Area (PHT): 2.80 cm    TR Peak grad:  67.2 mmHg MV Peak grad:  4.6 mmHg    TR Vmax:        410.00 cm/s MV Mean grad:  1.0 mmHg MV Vmax:       1.07 m/s    SHUNTS MV Vmean:      53.7 cm/s   Systemic VTI:  0.20 m MV Decel Time: 271  msec    Systemic Diam: 2.00 cm MV E velocity: 94.70 cm/s MV A velocity: 32.10 cm/s MV E/A ratio:  2.95 Buford Dresser MD Electronically signed by Buford Dresser MD Signature Date/Time: 01/03/2020/8:19:23 PM    Final    VAS [place] LOWER EXTREMITY VENOUS (DVT)  Result Date: 01/03/2020  Lower Venous DVTStudy Indications: Fever of unkown origin.  Comparison Study: No prior studies. Performing Technologist: Darlin Coco  Examination Guidelines: A complete evaluation includes B-mode imaging, spectral Doppler, color Doppler, and power Doppler as needed of all accessible portions of each vessel. Bilateral testing is considered an integral part of a complete examination. Limited examinations for reoccurring indications may be performed as noted. The reflux portion of the exam is performed with the patient in reverse Trendelenburg.  +---------+---------------+---------+-----------+----------+--------------+  RIGHT     Compressibility Phasicity Spontaneity Properties Thrombus Aging  +---------+---------------+---------+-----------+----------+--------------+  CFV       Full            Yes       Yes                                    +---------+---------------+---------+-----------+----------+--------------+  SFJ       Full                                                             +---------+---------------+---------+-----------+----------+--------------+  FV Prox   Full                                                             +---------+---------------+---------+-----------+----------+--------------+  FV Mid    Full                                                             +---------+---------------+---------+-----------+----------+--------------+  FV Distal Full                                                             +---------+---------------+---------+-----------+----------+--------------+  PFV       Full                                                              +---------+---------------+---------+-----------+----------+--------------+  POP       Full            Yes       Yes                                    +---------+---------------+---------+-----------+----------+--------------+  PTV       Full                                                             +---------+---------------+---------+-----------+----------+--------------+  PERO      Full                                                             +---------+---------------+---------+-----------+----------+--------------+   +---------+---------------+---------+-----------+----------+--------------+  LEFT      Compressibility Phasicity Spontaneity Properties Thrombus Aging  +---------+---------------+---------+-----------+----------+--------------+  CFV       Full            Yes       Yes                                    +---------+---------------+---------+-----------+----------+--------------+  SFJ       Full                                                             +---------+---------------+---------+-----------+----------+--------------+  FV Prox   Full                                                             +---------+---------------+---------+-----------+----------+--------------+  FV Mid    Full                                                             +---------+---------------+---------+-----------+----------+--------------+  FV Distal Full                                                             +---------+---------------+---------+-----------+----------+--------------+  PFV       Full                                                             +---------+---------------+---------+-----------+----------+--------------+  POP       Full            Yes       Yes                                    +---------+---------------+---------+-----------+----------+--------------+  PTV       Full                                                              +---------+---------------+---------+-----------+----------+--------------+  PERO      Full                                                             +---------+---------------+---------+-----------+----------+--------------+     Summary: RIGHT: - There is no evidence of deep vein thrombosis in the lower extremity.  - No cystic structure found in the popliteal fossa.  LEFT: - There is no evidence of deep vein thrombosis in the lower extremity.  - No cystic structure found in the popliteal fossa.  *See table(s) above for measurements and observations. Electronically signed by Harold Barban MD on 01/03/2020 at 7:39:03 PM.    Final       Scheduled Meds:  sodium chloride   Intravenous Once   amLODipine  10 mg Oral Daily   docusate sodium  100 mg Oral Daily   enoxaparin (LOVENOX) injection  30 mg Subcutaneous Q24H   furosemide  40 mg Oral Daily   hydrALAZINE  50 mg Oral Q8H   insulin aspart  0-15 Units Subcutaneous TID WC   irbesartan  300 mg Oral Daily   loratadine  10 mg Oral Daily   metoprolol succinate  100 mg Oral Daily   pantoprazole  20 mg Oral Daily   senna-docusate  1 tablet Oral QHS   sodium chloride flush  3 mL Intravenous Q12H   Continuous Infusions:  sodium chloride       LOS: 7 days      Time spent: 25 minutes   Dessa Phi, DO [facility] 01/04/2020, 10:36 AM   Available via Epic secure chat 7am-7pm After these hours, please refer to coverage provider listed on [email or web address]

## 2020-01-04 NOTE — Progress Notes (Signed)
Report given to [facility], [place]. Patient is alert and oriented x4 resting in bed with call light in reach. VSS. No fevers noted overnight. Per oncology leave port in place if patient remains afebrile.  Hgb 7.2, platelets 132, K+ 3.9 and Creat 2.02 this AM.

## 2020-01-05 ENCOUNTER — Telehealth: Payer: Self-pay | Admitting: Family Medicine

## 2020-01-05 DIAGNOSIS — I5033 Acute on chronic diastolic (congestive) heart failure: Secondary | ICD-10-CM | POA: Diagnosis not present

## 2020-01-05 LAB — CBC
HCT: 28 % — ABNORMAL LOW (ref 36.0–46.0)
Hemoglobin: 9.1 g/dL — ABNORMAL LOW (ref 12.0–15.0)
MCH: 31.4 pg (ref 26.0–34.0)
MCHC: 32.5 g/dL (ref 30.0–36.0)
MCV: 96.6 fL (ref 80.0–100.0)
Platelets: 142 10*3/uL — ABNORMAL LOW (ref 150–400)
RBC: 2.9 MIL/uL — ABNORMAL LOW (ref 3.87–5.11)
RDW: 18.8 % — ABNORMAL HIGH (ref 11.5–15.5)
WBC: 7.9 10*3/uL (ref 4.0–10.5)
nRBC: 1.6 % — ABNORMAL HIGH (ref 0.0–0.2)

## 2020-01-05 LAB — BASIC METABOLIC PANEL
Anion gap: 9 (ref 5–15)
BUN: 49 mg/dL — ABNORMAL HIGH (ref 8–23)
CO2: 23 mmol/L (ref 22–32)
Calcium: 8.3 mg/dL — ABNORMAL LOW (ref 8.9–10.3)
Chloride: 110 mmol/L (ref 98–111)
Creatinine, Ser: 1.86 mg/dL — ABNORMAL HIGH (ref 0.44–1.00)
GFR, Estimated: 27 mL/min — ABNORMAL LOW (ref >60)
Glucose, Bld: 101 mg/dL — ABNORMAL HIGH (ref 70–99)
Potassium: 3.8 mmol/L (ref 3.5–5.1)
Sodium: 142 mmol/L (ref 135–145)

## 2020-01-05 LAB — BPAM RBC
Blood Product Expiration Date: 202111052359
ISSUE DATE / TIME: 202110211132
Unit Type and Rh: 6200

## 2020-01-05 LAB — TYPE AND SCREEN
ABO/RH(D): A POS
Antibody Screen: NEGATIVE
Unit division: 0

## 2020-01-05 LAB — GLUCOSE, CAPILLARY: Glucose-Capillary: 119 mg/dL — ABNORMAL HIGH (ref 70–99)

## 2020-01-05 MED ORDER — HYDRALAZINE HCL 50 MG PO TABS
50.0000 mg | ORAL_TABLET | Freq: Three times a day (TID) | ORAL | 0 refills | Status: DC
Start: 2020-01-05 — End: 2020-01-22

## 2020-01-05 NOTE — Telephone Encounter (Signed)
Transition Care Management Unsuccessful Follow-up Telephone Call  Date of discharge and from where:  01/05/2020 from [facility]  Attempts:  1st Attempt  Reason for unsuccessful TCM follow-up call:  Left voice message

## 2020-01-05 NOTE — Progress Notes (Signed)
IV leaking and removed; patient refused a restart stating she might go home today.

## 2020-01-05 NOTE — Discharge Summary (Signed)
Physician Discharge Summary  ELOISE PICONE NLG:[id number] DOB: 1940/06/26 DOA: 12/27/2019  PCP: [name], MD  Admit date: 12/27/2019 Discharge date: 01/05/2020  Admitted From: Home Disposition: Home  Recommendations for Outpatient Follow-up:  1. Follow up with PCP in 1-2 weeks 2. Follow-up with oncology within 1 to 2 weeks 3. Please obtain BMP/CBC in one week 4. Please follow up with your PCP on the following pending results: Unresulted Labs (From admission, onward)         None       Home Health: Yes Equipment/Devices: None  Discharge Condition: Stable CODE STATUS: Full code Diet recommendation: Heart healthy diet  Subjective: Patient seen and examined.  No complaints.  Wants to go home.  Brief/Interim Summary: Donna May is an 79 yr old female who presented to [facility] ED on 12/27/2019 with complaints of shortness of breath. She had chemotherapy for cholangiocarcinoma earlier in the day. She also had increased lower extremity edema. She has chronic edema of the right arm due to prior mastectomy for BRCA in the 1990's. In the ED her CXR demonstrated pulmonary edema and she had a BNP of 2301.  She was admitted under hospitalist service. The patient had fevers of 100, 101, 100.4, 101.3 on 12/29/2019. Then she had fevers of 101.6 and 102.8 on the evening of 12/30/2019, 101.6 and 100.8 on the morning of 12/31/2019, and 101.2 on the morning of 01/01/2020. No source of infection had been identified. CXR was re-evaluated and demonstrated clearing of the pulmonary edema. It demonstrated no new acute abnormalities. CT chest, abdomen, and pelvis hasdemonstrated atelectasis vs infiltrate in the lungs, although this seems an unlikely source of the fevers.UA negative, andthe patient had no complaints of dysuria. Infectious disease recommended removal of the port. General surgery consulted.  Based on all of this, initial concern was pneumonia and patient was started on Rocephin and  Zithromax.  When repeat chest x-ray was negative, antibiotics were stopped.  Respiratory viral panel, respiratory culture, blood culture, all of them were negative.  Echo without any evidence of endocarditis.  Doppler lower extremity ruled out DVT.  Patient's last fever was almost 3 days ago.  She was seen by her primary oncologist Dr. Burr Medico who recommended holding off to removal of the port and consider this if she has fever but since then, patient has remained afebrile for the last 2 to 3 days.  For this reason, port was not removed.  General surgery remained on the sideline.  Patient had a small drop in her hemoglobin less than 7.5 and she was transfused 1 unit of PRBC on 01/04/2020 per oncology recommendations.  She also came in with AKI which has resolved.  She also came in with acute on chronic diastolic congestive heart failure and she was diuresed with Lasix and now she is euvolemic.  Her blood pressure was elevated for which hydralazine was added on top of her home medications.The patient has been evaluated by PT/OT. They have recommended discharge to home with home health PT/OT.  Patient has been cleared by all the consultants and she is doing fine without any complaint so she is being discharged in stable condition.  She will follow with PCP and her oncologist.  Discharge Diagnoses:  Principal Problem:   Acute on chronic diastolic CHF (congestive heart failure) ([place]) Active Problems:   Diabetes mellitus with renal complications ([place])   Essential hypertension   Intrahepatic cholangiocarcinoma ([place])   Acute respiratory failure with hypoxia ([facility])   CKD (chronic  kidney disease), stage III ([place])   Hypertensive urgency   Acute respiratory failure ([facility])   Acute on chronic congestive heart failure ([place])   Fever    Discharge Instructions   Allergies as of 01/05/2020   No Known Allergies     Medication List    STOP taking these medications   lidocaine-prilocaine cream Commonly known as:  EMLA     TAKE these medications   amLODipine 10 MG tablet Commonly known as: NORVASC Take 1 tablet (10 mg total) by mouth daily.   aspirin 81 MG tablet Take 81 mg by mouth daily.   furosemide 40 MG tablet Commonly known as: Lasix Take 1 tablet (40 mg total) by mouth 2 (two) times a week. What changed:   when to take this  additional instructions   glucose blood test strip 1 each by Other route 2 (two) times daily. Use Onetouch verio test strips as instructed to check blood sugar twice daily.   hydrALAZINE 50 MG tablet Commonly known as: APRESOLINE Take 1 tablet (50 mg total) by mouth every 8 (eight) hours.   hydrochlorothiazide 12.5 MG capsule Commonly known as: MICROZIDE Take 1 capsule (12.5 mg total) by mouth daily.   lansoprazole 15 MG capsule Commonly known as: PREVACID Take 1 capsule (15 mg total) by mouth daily.   loratadine 10 MG tablet Commonly known as: CLARITIN Take 10 mg by mouth daily as needed for allergies.   metoprolol succinate 100 MG 24 hr tablet Commonly known as: TOPROL-XL Take 1 tablet (100 mg total) by mouth daily. Take with or immediately following a meal.   Omega 3 1000 MG Caps Take 2,000 mg by mouth daily.   potassium chloride 10 MEQ tablet Commonly known as: KLOR-CON Take 1 tablet (10 mEq total) by mouth 2 (two) times a week. What changed:   when to take this  additional instructions   sitaGLIPtin 100 MG tablet Commonly known as: Januvia Take 1 tablet (100 mg total) by mouth daily.   valsartan 320 MG tablet Commonly known as: DIOVAN Take 1 tablet (320 mg total) by mouth daily.       Follow-up Information    [name], MD Follow up in 1 week(s).   Specialty: Family Medicine Contact information: [place] [id number] [phone]        Sanda Klein, MD .   Specialty: Cardiology Contact information: [address] [place] [address] [phone]               No Known Allergies  Consultations: ID, general surgery, oncology   Procedures/Studies: CT ABDOMEN PELVIS WO CONTRAST  Result Date: 01/01/2020 CLINICAL DATA:  79 year old female with shortness of breath and productive cough. History of cholangiocarcinoma. EXAM: CT CHEST, ABDOMEN AND PELVIS WITHOUT CONTRAST TECHNIQUE: Multidetector CT imaging of the chest, abdomen and pelvis was performed following the standard protocol without IV contrast. COMPARISON:  Chest radiograph dated 12/29/2019 and CT dated 01/20/2019. FINDINGS: Evaluation of this exam is limited in the absence of intravenous contrast. CT CHEST FINDINGS Cardiovascular: Borderline cardiomegaly. No pericardial effusion. There is coronary vascular calcification. There is hypoattenuation of the cardiac blood pool suggestive of anemia. Clinical correlation is recommended. There is moderate atherosclerotic calcification of the thoracic aorta. No aneurysmal dilatation. There is dilatation of the main pulmonary trunk suggestive of a degree of pulmonary hypertension. Clinical correlation is recommended. Left-sided Port-A-Cath with tip at the cavoatrial junction. Mediastinum/Nodes: No definite hilar or mediastinal adenopathy. Evaluation however is limited  in the absence of intravenous contrast. The esophagus and the thyroid gland are grossly unremarkable as visualized. No mediastinal fluid collection. Lungs/Pleura: Small bilateral pleural effusions. There are subsegmental bilateral lower lobe consolidative changes which may represent atelectasis or pneumonia. Similar appearance of a 5 mm nodular density in the right lower lobe (95/7). There is no pneumothorax. The central airways are patent. Musculoskeletal: Degenerative changes of the spine. No acute osseous pathology. CT ABDOMEN PELVIS FINDINGS No intra-abdominal free air or free fluid. Hepatobiliary: Several small hepatic hypodense lesions which are not characterized on this noncontrast CT but  demonstrate fluid attenuation, likely cysts. These appear similar to prior CT. There is interval decrease in the size of the segment 4 low attenuating area now measuring approximately 2.2 x 4.3 cm (previously 4.2 x 7.0 cm). The gallbladder is unremarkable as visualized. Pancreas: The pancreas is grossly unremarkable. Spleen: Normal in size without focal abnormality. Adrenals/Urinary Tract: The adrenal glands unremarkable. There is no hydronephrosis or nephrolithiasis on either side. Multiple bilateral renal cysts as seen previously. The visualized ureters and urinary bladder appear unremarkable. Stomach/Bowel: There is moderate stool throughout the colon. There is no bowel obstruction or active inflammation. The appendix is normal. Vascular/Lymphatic: Advanced aortoiliac atherosclerotic disease. The IVC is unremarkable. No portal venous gas. There is no adenopathy. Reproductive: Small partially calcified uterine fibroids. Other: Diffuse subcutaneous edema and anasarca new since the prior CT. Musculoskeletal: Degenerative changes.  No acute osseous pathology. IMPRESSION: 1. Small bilateral pleural effusions with subsegmental bilateral lower lobe consolidative changes which may represent atelectasis or pneumonia. Clinical correlation is recommended. This finding is new compared to prior CT. 2. Interval decrease in the size of the segment IV hepatic low attenuating area. 3. Anasarca, new since the prior CT. 4. Aortic Atherosclerosis (ICD10-I70.0). Electronically Signed   By: Anner Crete M.D.   On: 01/01/2020 23:17   CT CHEST WO CONTRAST  Result Date: 01/01/2020 CLINICAL DATA:  79 year old female with shortness of breath and productive cough. History of cholangiocarcinoma. EXAM: CT CHEST, ABDOMEN AND PELVIS WITHOUT CONTRAST TECHNIQUE: Multidetector CT imaging of the chest, abdomen and pelvis was performed following the standard protocol without IV contrast. COMPARISON:  Chest radiograph dated 12/29/2019 and CT  dated 01/20/2019. FINDINGS: Evaluation of this exam is limited in the absence of intravenous contrast. CT CHEST FINDINGS Cardiovascular: Borderline cardiomegaly. No pericardial effusion. There is coronary vascular calcification. There is hypoattenuation of the cardiac blood pool suggestive of anemia. Clinical correlation is recommended. There is moderate atherosclerotic calcification of the thoracic aorta. No aneurysmal dilatation. There is dilatation of the main pulmonary trunk suggestive of a degree of pulmonary hypertension. Clinical correlation is recommended. Left-sided Port-A-Cath with tip at the cavoatrial junction. Mediastinum/Nodes: No definite hilar or mediastinal adenopathy. Evaluation however is limited in the absence of intravenous contrast. The esophagus and the thyroid gland are grossly unremarkable as visualized. No mediastinal fluid collection. Lungs/Pleura: Small bilateral pleural effusions. There are subsegmental bilateral lower lobe consolidative changes which may represent atelectasis or pneumonia. Similar appearance of a 5 mm nodular density in the right lower lobe (95/7). There is no pneumothorax. The central airways are patent. Musculoskeletal: Degenerative changes of the spine. No acute osseous pathology. CT ABDOMEN PELVIS FINDINGS No intra-abdominal free air or free fluid. Hepatobiliary: Several small hepatic hypodense lesions which are not characterized on this noncontrast CT but demonstrate fluid attenuation, likely cysts. These appear similar to prior CT. There is interval decrease in the size of the segment 4 low attenuating area now measuring approximately  2.2 x 4.3 cm (previously 4.2 x 7.0 cm). The gallbladder is unremarkable as visualized. Pancreas: The pancreas is grossly unremarkable. Spleen: Normal in size without focal abnormality. Adrenals/Urinary Tract: The adrenal glands unremarkable. There is no hydronephrosis or nephrolithiasis on either side. Multiple bilateral renal cysts  as seen previously. The visualized ureters and urinary bladder appear unremarkable. Stomach/Bowel: There is moderate stool throughout the colon. There is no bowel obstruction or active inflammation. The appendix is normal. Vascular/Lymphatic: Advanced aortoiliac atherosclerotic disease. The IVC is unremarkable. No portal venous gas. There is no adenopathy. Reproductive: Small partially calcified uterine fibroids. Other: Diffuse subcutaneous edema and anasarca new since the prior CT. Musculoskeletal: Degenerative changes.  No acute osseous pathology. IMPRESSION: 1. Small bilateral pleural effusions with subsegmental bilateral lower lobe consolidative changes which may represent atelectasis or pneumonia. Clinical correlation is recommended. This finding is new compared to prior CT. 2. Interval decrease in the size of the segment IV hepatic low attenuating area. 3. Anasarca, new since the prior CT. 4. Aortic Atherosclerosis (ICD10-I70.0). Electronically Signed   By: Anner Crete M.D.   On: 01/01/2020 23:17   NM PET Image Restag (PS) Skull Base To Thigh  Result Date: 12/18/2019 CLINICAL DATA:  Subsequent treatment strategy for intrahepatic cholangiocarcinoma. The patient originally had peritoneal metastatic disease along the right hemidiaphragm. Ongoing maintenance single agent gemcitabine. EXAM: NUCLEAR MEDICINE PET SKULL BASE TO THIGH TECHNIQUE: 6.9 mCi F-18 FDG was injected intravenously. Full-ring PET imaging was performed from the skull base to thigh after the radiotracer. CT data was obtained and used for attenuation correction and anatomic localization. Fasting blood glucose: 78 mg/dl COMPARISON:  Multiple exams, including 09/15/2019 FINDINGS: Mediastinal blood pool activity: SUV max 2.2 Liver activity: SUV max 2.8 NECK: Lingual tonsillar activity maximum SUV 6.5, previously 4.6. Glottic activity appears symmetric and likely physiologic. Incidental CT findings: Mild bilateral common carotid  atherosclerotic calcification. CHEST: Activity in the port of the Port-A-Cath is thought to be injection related. No significant abnormal hypermetabolic activity in the chest. Incidental CT findings: Mild cardiomegaly. Low-level subcutaneous edema. Prior right mastectomy with axillary dissection. Coronary, aortic arch, and branch vessel atherosclerotic vascular disease. Faint mosaic attenuation in the lungs possibly from air trapping or trace edema. Prominent main pulmonary artery compatible with pulmonary arterial hypertension. ABDOMEN/PELVIS: The triangular hypodense region above the gallbladder fossa measures about 4.7 by 2.7 cm on image 89/4 (stable) and does not have accentuated metabolic activity, sampled region with maximum SUV of 2.4 which is below the background hepatic activity. Other small hepatic hypodense lesions are similar to the prior exam and not hypermetabolic. Overall no hypermetabolic activity in the abdomen/pelvis is identified to suggest active malignancy. Incidental CT findings: Trace ascites along the paracolic gutters. Mesenteric and subcutaneous edema. Right ovarian dermoid with fatty and calcific elements. Aortoiliac atherosclerotic vascular disease. Calcified uterine fibroids. Hypodense renal lesions favoring cysts. SKELETON: No significant abnormal hypermetabolic activity in this region. Incidental CT findings: Right greater than left degenerative glenohumeral arthropathy. Mesoacromial os acromiale bilaterally. Dextroconvex thoracic scoliosis with levoconvex lumbar scoliosis, with rotary component. Lower lumbar degenerative facet arthropathy. IMPRESSION: 1. No significant abnormal activity to suggest active/recurrent malignancy. 2. Lingual tonsillar activity is mildly increased from prior but probably incidental/physiologic, attention on follow up suggested. 3. Other imaging findings of potential clinical significance: Third spacing of fluid with trace ascites, mesenteric edema,  subcutaneous edema, and possibly subtle pulmonary edema. Mild cardiomegaly. Aortic Atherosclerosis (ICD10-I70.0). Coronary atherosclerosis. Right ovarian dermoid. Calcified uterine fibroids. Renal cysts. Right glenohumeral arthropathy. Mild to  moderate scoliosis. Suspected pulmonary arterial hypertension. Electronically Signed   By: Van Clines M.D.   On: 12/18/2019 09:36   DG CHEST PORT 1 VIEW  Result Date: 12/29/2019 CLINICAL DATA:  Follow-up pulmonary edema EXAM: PORTABLE CHEST 1 VIEW COMPARISON:  12/27/2019 FINDINGS: Cardiac shadow remains enlarged. Left chest wall port is again noted. Resolution of previously seen airspace opacities is noted consistent with resolving edema. No bony abnormality is noted. IMPRESSION: Previously seen edema has resolved in the interval. No new focal abnormality is seen. Electronically Signed   By: Inez Catalina M.D.   On: 12/29/2019 19:22   DG Chest Port 1 View  Result Date: 12/27/2019 CLINICAL DATA:  79 year old female with shortness of breath. Symptom onset 1800 hours. Extremity swelling. EXAM: PORTABLE CHEST 1 VIEW COMPARISON:  Portable chest 10/20/2019 and earlier. FINDINGS: Portable AP semi upright view at 2107 hours. Stable cardiomegaly and mediastinal contours. Stable left chest power port. Asymmetric patchy and interstitial pulmonary opacity greater in the right lung. Basilar predominance. Appearance is similar but less pronounced than that in October 31, 2022. No pneumothorax. No pleural effusion is evident. No acute osseous abnormality identified. Negative visible bowel gas pattern. Chronic right trust wall surgical clips. IMPRESSION: Chronic cardiomegaly with asymmetric lung opacity favored due to pulmonary edema. Viral/atypical respiratory infection is felt less likely. Electronically Signed   By: Genevie Ann M.D.   On: 12/27/2019 21:26   ECHOCARDIOGRAM COMPLETE  Result Date: 01/03/2020    ECHOCARDIOGRAM REPORT   Patient Name:   OMMIE DEGEORGE Date of Exam:  01/03/2020 Medical Rec #:  [id number]     Height:       65.0 in Accession #:    [id number]    Weight:       126.5 lb Date of Birth:  April 07, 1940    BSA:          1.628 m Patient Age:    18 years      BP:           137/56 mmHg Patient Gender: F             HR:           68 bpm. Exam Location:  Inpatient Procedure: 2D Echo, Cardiac Doppler and Color Doppler Indications:    Fever  History:        Patient has prior history of Echocardiogram examinations, most                 recent 10/19/2019. CHF; Risk Factors:Diabetes, Hypertension,                 Former Smoker and Dyslipidemia. CKD.  Sonographer:    Clayton Lefort RDCS (AE) Referring Phys: [id number] JENNIFER CHOI IMPRESSIONS  1. Left ventricular ejection fraction, by estimation, is 60 to 65%. The left ventricle has normal function. The left ventricle has no regional wall motion abnormalities. There is moderate concentric left ventricular hypertrophy. Left ventricular diastolic parameters are consistent with Grade III diastolic dysfunction (restrictive).  2. Right ventricular systolic function is normal. The right ventricular size is normal. There is severely elevated pulmonary artery systolic pressure.  3. Left atrial size was severely dilated.  4. Right atrial size was moderately dilated.  5. A small pericardial effusion is present. The pericardial effusion is circumferential.  6. The mitral valve is normal in structure. Mild mitral valve regurgitation.  7. Tricuspid valve regurgitation is moderate.  8. The aortic valve is tricuspid. Aortic valve regurgitation is not visualized. No aortic stenosis  is present.  9. The inferior vena cava is dilated in size with >50% respiratory variability, suggesting right atrial pressure of 8 mmHg. Comparison(s): No prior Echocardiogram. Conclusion(s)/Recommendation(s): Severely elevated right sided filling pressures, grade 3 diastilic dysfunction. FINDINGS  Left Ventricle: Myocardial appearance suggests possibility of amyloid. Left  ventricular ejection fraction, by estimation, is 60 to 65%. The left ventricle has normal function. The left ventricle has no regional wall motion abnormalities. The left ventricular internal cavity size was normal in size. There is moderate concentric left ventricular hypertrophy. Left ventricular diastolic parameters are consistent with Grade III diastolic dysfunction (restrictive). Right Ventricle: The right ventricular size is normal. No increase in right ventricular wall thickness. Right ventricular systolic function is normal. There is severely elevated pulmonary artery systolic pressure. The tricuspid regurgitant velocity is 4.10 m/s, and with an assumed right atrial pressure of 8 mmHg, the estimated right ventricular systolic pressure is 96.2 mmHg. Left Atrium: Left atrial size was severely dilated. Right Atrium: Right atrial size was moderately dilated. Pericardium: A small pericardial effusion is present. The pericardial effusion is circumferential. Mitral Valve: The mitral valve is normal in structure. There is mild thickening of the mitral valve leaflet(s). There is mild calcification of the mitral valve leaflet(s). Mild mitral valve regurgitation. MV peak gradient, 4.6 mmHg. The mean mitral valve  gradient is 1.0 mmHg. Tricuspid Valve: The tricuspid valve is normal in structure. Tricuspid valve regurgitation is moderate. Aortic Valve: The aortic valve is tricuspid. Aortic valve regurgitation is not visualized. No aortic stenosis is present. Aortic valve mean gradient measures 3.0 mmHg. Aortic valve peak gradient measures 6.1 mmHg. Aortic valve area, by VTI measures 2.53 cm. Pulmonic Valve: The pulmonic valve was grossly normal. Pulmonic valve regurgitation is trivial. No evidence of pulmonic stenosis. Aorta: The aortic root and ascending aorta are structurally normal, with no evidence of dilitation. Venous: The inferior vena cava is dilated in size with greater than 50% respiratory variability,  suggesting right atrial pressure of 8 mmHg. IAS/Shunts: The atrial septum is grossly normal.  LEFT VENTRICLE PLAX 2D LVIDd:         3.90 cm  Diastology LVIDs:         2.60 cm  LV e' medial:    6.42 cm/s LV PW:         1.70 cm  LV E/e' medial:  14.8 LV IVS:        1.60 cm  LV e' lateral:   9.36 cm/s LVOT diam:     2.00 cm  LV E/e' lateral: 10.1 LV SV:         63 LV SV Index:   38 LVOT Area:     3.14 cm  RIGHT VENTRICLE             IVC RV Basal diam:  3.70 cm     IVC diam: 2.10 cm RV S prime:     11.40 cm/s TAPSE (M-mode): 1.9 cm LEFT ATRIUM             Index       RIGHT ATRIUM           Index LA diam:        4.60 cm 2.83 cm/m  RA Area:     18.30 cm LA Vol (A2C):   96.5 ml 59.27 ml/m RA Volume:   50.20 ml  30.83 ml/m LA Vol (A4C):   80.5 ml 49.44 ml/m LA Biplane Vol: 96.2 ml 59.08 ml/m  AORTIC VALVE AV Area (Vmax):  2.58 cm AV Area (Vmean):   2.41 cm AV Area (VTI):     2.53 cm AV Vmax:           123.00 cm/s AV Vmean:          83.000 cm/s AV VTI:            0.247 m AV Peak Grad:      6.1 mmHg AV Mean Grad:      3.0 mmHg LVOT Vmax:         101.00 cm/s LVOT Vmean:        63.700 cm/s LVOT VTI:          0.199 m LVOT/AV VTI ratio: 0.81  AORTA Ao Root diam: 3.20 cm Ao Asc diam:  2.90 cm MITRAL VALVE               TRICUSPID VALVE MV Area (PHT): 2.80 cm    TR Peak grad:   67.2 mmHg MV Peak grad:  4.6 mmHg    TR Vmax:        410.00 cm/s MV Mean grad:  1.0 mmHg MV Vmax:       1.07 m/s    SHUNTS MV Vmean:      53.7 cm/s   Systemic VTI:  0.20 m MV Decel Time: 271 msec    Systemic Diam: 2.00 cm MV E velocity: 94.70 cm/s MV A velocity: 32.10 cm/s MV E/A ratio:  2.95 Buford Dresser MD Electronically signed by Buford Dresser MD Signature Date/Time: 01/03/2020/8:19:23 PM    Final    VAS [place] LOWER EXTREMITY VENOUS (DVT)  Result Date: 01/03/2020  Lower Venous DVTStudy Indications: Fever of unkown origin.  Comparison Study: No prior studies. Performing Technologist: Darlin Coco  Examination Guidelines: A  complete evaluation includes B-mode imaging, spectral Doppler, color Doppler, and power Doppler as needed of all accessible portions of each vessel. Bilateral testing is considered an integral part of a complete examination. Limited examinations for reoccurring indications may be performed as noted. The reflux portion of the exam is performed with the patient in reverse Trendelenburg.  +---------+---------------+---------+-----------+----------+--------------+ RIGHT    CompressibilityPhasicitySpontaneityPropertiesThrombus Aging +---------+---------------+---------+-----------+----------+--------------+ CFV      Full           Yes      Yes                                 +---------+---------------+---------+-----------+----------+--------------+ SFJ      Full                                                        +---------+---------------+---------+-----------+----------+--------------+ FV Prox  Full                                                        +---------+---------------+---------+-----------+----------+--------------+ FV Mid   Full                                                        +---------+---------------+---------+-----------+----------+--------------+  FV DistalFull                                                        +---------+---------------+---------+-----------+----------+--------------+ PFV      Full                                                        +---------+---------------+---------+-----------+----------+--------------+ POP      Full           Yes      Yes                                 +---------+---------------+---------+-----------+----------+--------------+ PTV      Full                                                        +---------+---------------+---------+-----------+----------+--------------+ PERO     Full                                                         +---------+---------------+---------+-----------+----------+--------------+   +---------+---------------+---------+-----------+----------+--------------+ LEFT     CompressibilityPhasicitySpontaneityPropertiesThrombus Aging +---------+---------------+---------+-----------+----------+--------------+ CFV      Full           Yes      Yes                                 +---------+---------------+---------+-----------+----------+--------------+ SFJ      Full                                                        +---------+---------------+---------+-----------+----------+--------------+ FV Prox  Full                                                        +---------+---------------+---------+-----------+----------+--------------+ FV Mid   Full                                                        +---------+---------------+---------+-----------+----------+--------------+ FV DistalFull                                                        +---------+---------------+---------+-----------+----------+--------------+  PFV      Full                                                        +---------+---------------+---------+-----------+----------+--------------+ POP      Full           Yes      Yes                                 +---------+---------------+---------+-----------+----------+--------------+ PTV      Full                                                        +---------+---------------+---------+-----------+----------+--------------+ PERO     Full                                                        +---------+---------------+---------+-----------+----------+--------------+     Summary: RIGHT: - There is no evidence of deep vein thrombosis in the lower extremity.  - No cystic structure found in the popliteal fossa.  LEFT: - There is no evidence of deep vein thrombosis in the lower extremity.  - No cystic structure found in the popliteal fossa.   *See table(s) above for measurements and observations. Electronically signed by Harold Barban MD on 01/03/2020 at 7:39:03 PM.    Final       Discharge Exam: Vitals:   01/04/20 2129 01/05/20 0526  BP: (!) 142/58 (!) 153/70  Pulse: 64 70  Resp: 16 18  Temp: 98.1 F (36.7 C) 98.3 F (36.8 C)  SpO2: 95% 97%   Vitals:   01/04/20 1446 01/04/20 1620 01/04/20 2129 01/05/20 0526  BP: (!) 138/59 (!) 149/62 (!) 142/58 (!) 153/70  Pulse: 63 67 64 70  Resp: $Remo'17 17 16 18  'FaJmd$ Temp: 98.7 F (37.1 C) 98.4 F (36.9 C) 98.1 F (36.7 C) 98.3 F (36.8 C)  TempSrc: Oral Oral Oral Oral  SpO2: 94% 94% 95% 97%  Weight:    59.9 kg  Height:        General: Pt is alert, awake, not in acute distress Cardiovascular: RRR, S1/S2 +, no rubs, no gallops Respiratory: CTA bilaterally, no wheezing, no rhonchi Abdominal: Soft, NT, ND, bowel sounds + Extremities: no edema, no cyanosis    The results of significant diagnostics from this hospitalization (including imaging, microbiology, ancillary and laboratory) are listed below for reference.     Microbiology: Recent Results (from the past 240 hour(s))  Respiratory Panel by RT PCR (Flu A&B, Covid) - Nasopharyngeal Swab     Status: None   Collection Time: 12/27/19  8:50 PM   Specimen: Nasopharyngeal Swab  Result Value Ref Range Status   SARS Coronavirus 2 by RT PCR NEGATIVE NEGATIVE Final    Comment: (NOTE) SARS-CoV-2 target nucleic acids are NOT DETECTED.  The SARS-CoV-2 RNA is generally detectable in upper respiratoy specimens during the acute phase of infection. The lowest concentration of SARS-CoV-2 viral  copies this assay can detect is 131 copies/mL. A negative result does not preclude SARS-Cov-2 infection and should not be used as the sole basis for treatment or other patient management decisions. A negative result may occur with  improper specimen collection/handling, submission of specimen other than nasopharyngeal swab, presence of viral  mutation(s) within the areas targeted by this assay, and inadequate number of viral copies (<131 copies/mL). A negative result must be combined with clinical observations, patient history, and epidemiological information. The expected result is Negative.  Fact Sheet for Patients:  PinkCheek.be  Fact Sheet for Healthcare Providers:  GravelBags.it  This test is no t yet approved or cleared by the [place] FDA and  has been authorized for detection and/or diagnosis of SARS-CoV-2 by FDA under an Emergency Use Authorization (EUA). This EUA will remain  in effect (meaning this test can be used) for the duration of the COVID-19 declaration under Section 564(b)(1) of the Act, 21 U.S.C. section 360bbb-3(b)(1), unless the authorization is terminated or revoked sooner.     Influenza A by PCR NEGATIVE NEGATIVE Final   Influenza B by PCR NEGATIVE NEGATIVE Final    Comment: (NOTE) The Xpert Xpress SARS-CoV-2/FLU/RSV assay is intended as an aid in  the diagnosis of influenza from Nasopharyngeal swab specimens and  should not be used as a sole basis for treatment. Nasal washings and  aspirates are unacceptable for Xpert Xpress SARS-CoV-2/FLU/RSV  testing.  Fact Sheet for Patients: PinkCheek.be  Fact Sheet for Healthcare Providers: GravelBags.it  This test is not yet approved or cleared by the [place] FDA and  has been authorized for detection and/or diagnosis of SARS-CoV-2 by  FDA under an Emergency Use Authorization (EUA). This EUA will remain  in effect (meaning this test can be used) for the duration of the  Covid-19 declaration under Section 564(b)(1) of the Act, 21  U.S.C. section 360bbb-3(b)(1), unless the authorization is  terminated or revoked. Performed at [facility], [place] [address] [place], [place] [address]   Culture, blood (routine x 2)      Status: None   Collection Time: 12/29/19  8:29 AM   Specimen: BLOOD LEFT HAND  Result Value Ref Range Status   Specimen Description BLOOD LEFT HAND  Final   Special Requests   Final    BOTTLES DRAWN AEROBIC ONLY Blood Culture adequate volume   Culture   Final    NO GROWTH 5 DAYS Performed at [facility], [place] [address], [place], [place] [address]    Report Status 01/03/2020 FINAL  Final  Culture, blood (routine x 2)     Status: None   Collection Time: 12/29/19 10:22 AM   Specimen: BLOOD LEFT HAND  Result Value Ref Range Status   Specimen Description BLOOD LEFT HAND  Final   Special Requests   Final    BOTTLES DRAWN AEROBIC ONLY Blood Culture adequate volume   Culture   Final    NO GROWTH 5 DAYS Performed at [facility], [place] [address] [place], [place] [address]    Report Status 01/03/2020 FINAL  Final  Respiratory Panel by PCR     Status: None   Collection Time: 12/29/19  9:36 PM   Specimen: Nasopharyngeal Swab; Respiratory  Result Value Ref Range Status   Adenovirus NOT DETECTED NOT DETECTED Final   Coronavirus 229E NOT DETECTED NOT DETECTED Final    Comment: (NOTE) The Coronavirus on the Respiratory Panel, DOES NOT test for the novel  Coronavirus (2019 nCoV)    Coronavirus HKU1 NOT DETECTED NOT DETECTED Final   Coronavirus NL63 NOT DETECTED NOT DETECTED Final   Coronavirus OC43 NOT DETECTED NOT DETECTED Final   Metapneumovirus NOT DETECTED NOT DETECTED Final   Rhinovirus / Enterovirus NOT DETECTED NOT DETECTED Final   Influenza A NOT DETECTED NOT DETECTED Final   Influenza B NOT DETECTED NOT DETECTED Final   Parainfluenza Virus 1 NOT DETECTED NOT DETECTED Final   Parainfluenza Virus 2 NOT DETECTED NOT DETECTED Final   Parainfluenza Virus 3 NOT DETECTED NOT DETECTED Final   Parainfluenza Virus 4 NOT DETECTED NOT DETECTED Final   Respiratory Syncytial Virus NOT DETECTED NOT DETECTED Final   Bordetella pertussis NOT DETECTED NOT DETECTED Final    Chlamydophila pneumoniae NOT DETECTED NOT DETECTED Final   Mycoplasma pneumoniae NOT DETECTED NOT DETECTED Final    Comment: Performed at [facility], [place] [address] [place], [place] [address]  Sputum culture     Status: None   Collection Time: 12/30/19  3:52 AM   Specimen: Expectorated Sputum  Result Value Ref Range Status   Specimen Description EXPECTORATED SPUTUM  Final   Special Requests NONE  Final   Sputum evaluation   Final    THIS SPECIMEN IS ACCEPTABLE FOR SPUTUM CULTURE Performed at [facility], [place] [address] [place], [place] [address]    Report Status 12/30/2019 FINAL  Final  Culture, respiratory     Status: None   Collection Time: 12/30/19  3:52 AM  Result Value Ref Range Status   Specimen Description EXPECTORATED SPUTUM  Final   Special Requests NONE Reflexed from [id number]  Final   Gram Stain   Final    MODERATE WBC PRESENT, PREDOMINANTLY PMN FEW SQUAMOUS EPITHELIAL CELLS PRESENT ABUNDANT GRAM POSITIVE COCCI MODERATE GRAM NEGATIVE RODS MODERATE GRAM POSITIVE RODS    Culture   Final    Normal respiratory flora-no Staph aureus or Pseudomonas seen Performed at [facility], [place] [address], [place], [place] [address]    Report Status 01/01/2020 FINAL  Final     Labs: BNP (last 3 results) Recent Labs    10/19/19 0019 12/27/19 2050  BNP 2,498.8* 1,950.9*   Basic Metabolic Panel: Recent Labs  Lab 12/31/19 0155 01/01/20 0327 01/03/20 0309 01/04/20 0120 01/05/20 0129  NA 138 134* 137 140 142  K 4.2 3.7 3.9 3.9 3.8  CL 104 101 104 106 110  CO2 21* $Remov'23 22 23 23  'nLmNeO$ GLUCOSE 95 102* 90 125* 101*  BUN 54* 62* 61* 57* 49*  CREATININE 2.19* 2.40* 2.05* 2.02* 1.86*  CALCIUM 8.3* 8.1* 8.3* 8.2* 8.3*   Liver Function Tests: Recent Labs  Lab 01/03/20 0309  AST 21  ALT 12  ALKPHOS 61  BILITOT 0.9  PROT 5.5*  ALBUMIN 2.1*   No results for input(s): LIPASE, AMYLASE in the last 168 hours. No results for input(s): AMMONIA in the last 168  hours. CBC: Recent Labs  Lab 01/03/20 0309 01/04/20 0120 01/04/20 1736 01/05/20 0129  WBC 9.6 7.4  --  7.9  NEUTROABS 8.1*  --   --   --   HGB 7.2* 7.2* 9.3* 9.1*  HCT 22.7* 22.6* 28.3* 28.0*  MCV 97.0 97.4  --  96.6  PLT 103* 132*  --  142*   Cardiac Enzymes: No results for input(s): CKTOTAL, CKMB, CKMBINDEX, TROPONINI in the last 168 hours. BNP: Invalid input(s): POCBNP CBG: Recent Labs  Lab 01/04/20 0750 01/04/20 1117 01/04/20 1619  01/04/20 2131 01/05/20 0801  GLUCAP 84 171* 84 145* 119*   D-Dimer No results for input(s): DDIMER in the last 72 hours. Hgb A1c No results for input(s): HGBA1C in the last 72 hours. Lipid Profile No results for input(s): CHOL, HDL, LDLCALC, TRIG, CHOLHDL, LDLDIRECT in the last 72 hours. Thyroid function studies No results for input(s): TSH, T4TOTAL, T3FREE, THYROIDAB in the last 72 hours.  Invalid input(s): FREET3 Anemia work up No results for input(s): VITAMINB12, FOLATE, FERRITIN, TIBC, IRON, RETICCTPCT in the last 72 hours. Urinalysis    Component Value Date/Time   COLORURINE YELLOW 01/01/2020 1733   APPEARANCEUR CLEAR 01/01/2020 1733   LABSPEC 1.014 01/01/2020 1733   PHURINE 5.0 01/01/2020 1733   GLUCOSEU NEGATIVE 01/01/2020 1733   HGBUR SMALL (A) 01/01/2020 1733   BILIRUBINUR NEGATIVE 01/01/2020 [place] 01/01/2020 1733   PROTEINUR NEGATIVE 01/01/2020 1733   NITRITE NEGATIVE 01/01/2020 1733   LEUKOCYTESUR NEGATIVE 01/01/2020 1733   Sepsis Labs Invalid input(s): PROCALCITONIN,  WBC,  LACTICIDVEN Microbiology Recent Results (from the past 240 hour(s))  Respiratory Panel by RT PCR (Flu A&B, Covid) - Nasopharyngeal Swab     Status: None   Collection Time: 12/27/19  8:50 PM   Specimen: Nasopharyngeal Swab  Result Value Ref Range Status   SARS Coronavirus 2 by RT PCR NEGATIVE NEGATIVE Final    Comment: (NOTE) SARS-CoV-2 target nucleic acids are NOT DETECTED.  The SARS-CoV-2 RNA is generally detectable  in upper respiratoy specimens during the acute phase of infection. The lowest concentration of SARS-CoV-2 viral copies this assay can detect is 131 copies/mL. A negative result does not preclude SARS-Cov-2 infection and should not be used as the sole basis for treatment or other patient management decisions. A negative result may occur with  improper specimen collection/handling, submission of specimen other than nasopharyngeal swab, presence of viral mutation(s) within the areas targeted by this assay, and inadequate number of viral copies (<131 copies/mL). A negative result must be combined with clinical observations, patient history, and epidemiological information. The expected result is Negative.  Fact Sheet for Patients:  PinkCheek.be  Fact Sheet for Healthcare Providers:  GravelBags.it  This test is no t yet approved or cleared by the [place] FDA and  has been authorized for detection and/or diagnosis of SARS-CoV-2 by FDA under an Emergency Use Authorization (EUA). This EUA will remain  in effect (meaning this test can be used) for the duration of the COVID-19 declaration under Section 564(b)(1) of the Act, 21 U.S.C. section 360bbb-3(b)(1), unless the authorization is terminated or revoked sooner.     Influenza A by PCR NEGATIVE NEGATIVE Final   Influenza B by PCR NEGATIVE NEGATIVE Final    Comment: (NOTE) The Xpert Xpress SARS-CoV-2/FLU/RSV assay is intended as an aid in  the diagnosis of influenza from Nasopharyngeal swab specimens and  should not be used as a sole basis for treatment. Nasal washings and  aspirates are unacceptable for Xpert Xpress SARS-CoV-2/FLU/RSV  testing.  Fact Sheet for Patients: PinkCheek.be  Fact Sheet for Healthcare Providers: GravelBags.it  This test is not yet approved or cleared by the [place] FDA and  has been  authorized for detection and/or diagnosis of SARS-CoV-2 by  FDA under an Emergency Use Authorization (EUA). This EUA will remain  in effect (meaning this test can be used) for the duration of the  Covid-19 declaration under Section 564(b)(1) of the Act, 21  U.S.C. section 360bbb-3(b)(1), unless the authorization is  terminated or revoked.  Performed at [facility], [place] [address], [place], [place] [address]   Culture, blood (routine x 2)     Status: None   Collection Time: 12/29/19  8:29 AM   Specimen: BLOOD LEFT HAND  Result Value Ref Range Status   Specimen Description BLOOD LEFT HAND  Final   Special Requests   Final    BOTTLES DRAWN AEROBIC ONLY Blood Culture adequate volume   Culture   Final    NO GROWTH 5 DAYS Performed at [facility], [place] [address], [place], [place] [address]    Report Status 01/03/2020 FINAL  Final  Culture, blood (routine x 2)     Status: None   Collection Time: 12/29/19 10:22 AM   Specimen: BLOOD LEFT HAND  Result Value Ref Range Status   Specimen Description BLOOD LEFT HAND  Final   Special Requests   Final    BOTTLES DRAWN AEROBIC ONLY Blood Culture adequate volume   Culture   Final    NO GROWTH 5 DAYS Performed at [facility], [place] [address] [place], [place] [address]    Report Status 01/03/2020 FINAL  Final  Respiratory Panel by PCR     Status: None   Collection Time: 12/29/19  9:36 PM   Specimen: Nasopharyngeal Swab; Respiratory  Result Value Ref Range Status   Adenovirus NOT DETECTED NOT DETECTED Final   Coronavirus 229E NOT DETECTED NOT DETECTED Final    Comment: (NOTE) The Coronavirus on the Respiratory Panel, DOES NOT test for the novel  Coronavirus (2019 nCoV)    Coronavirus HKU1 NOT DETECTED NOT DETECTED Final   Coronavirus NL63 NOT DETECTED NOT DETECTED Final   Coronavirus OC43 NOT DETECTED NOT DETECTED Final   Metapneumovirus NOT DETECTED NOT DETECTED Final   Rhinovirus / Enterovirus NOT DETECTED NOT DETECTED  Final   Influenza A NOT DETECTED NOT DETECTED Final   Influenza B NOT DETECTED NOT DETECTED Final   Parainfluenza Virus 1 NOT DETECTED NOT DETECTED Final   Parainfluenza Virus 2 NOT DETECTED NOT DETECTED Final   Parainfluenza Virus 3 NOT DETECTED NOT DETECTED Final   Parainfluenza Virus 4 NOT DETECTED NOT DETECTED Final   Respiratory Syncytial Virus NOT DETECTED NOT DETECTED Final   Bordetella pertussis NOT DETECTED NOT DETECTED Final   Chlamydophila pneumoniae NOT DETECTED NOT DETECTED Final   Mycoplasma pneumoniae NOT DETECTED NOT DETECTED Final    Comment: Performed at [facility], [place]. [address], [place], [place] [address]  Sputum culture     Status: None   Collection Time: 12/30/19  3:52 AM   Specimen: Expectorated Sputum  Result Value Ref Range Status   Specimen Description EXPECTORATED SPUTUM  Final   Special Requests NONE  Final   Sputum evaluation   Final    THIS SPECIMEN IS ACCEPTABLE FOR SPUTUM CULTURE Performed at [facility], [place] [address] [place], [place] [address]    Report Status 12/30/2019 FINAL  Final  Culture, respiratory     Status: None   Collection Time: 12/30/19  3:52 AM  Result Value Ref Range Status   Specimen Description EXPECTORATED SPUTUM  Final   Special Requests NONE Reflexed from [id number]  Final   Gram Stain   Final    MODERATE WBC PRESENT, PREDOMINANTLY PMN FEW SQUAMOUS EPITHELIAL CELLS PRESENT ABUNDANT GRAM POSITIVE COCCI MODERATE GRAM NEGATIVE RODS MODERATE GRAM POSITIVE RODS    Culture   Final    Normal respiratory flora-no Staph aureus or  Pseudomonas seen Performed at [place] [address], [place], [place] [address]    Report Status 01/01/2020 FINAL  Final     Time coordinating discharge: Over 30 minutes  SIGNED:   Darliss Cheney, MD  [facility] 01/05/2020, 9:12 AM  If 7PM-7AM, please contact night-coverage [email or web address]

## 2020-01-05 NOTE — Discharge Instructions (Signed)
Fever, Adult     A fever is an increase in your body's temperature. It often means a temperature of 100.4F (38C) or higher. Brief mild or moderate fevers often have no long-term effects. They often do not need treatment. Moderate or high fevers may make you feel uncomfortable. Sometimes, they can be a sign of a serious illness or disease. A fever that keeps coming back or that lasts a long time may cause you to lose water in your body (get dehydrated). You can take your temperature with a thermometer to see if you have a fever. Temperature can change with:  Age.  Time of day.  Where the thermometer is put in the body. Readings may vary when the thermometer is put: ? In the mouth (oral). ? In the butt (rectal). ? In the ear (tympanic). ? Under the arm (axillary). ? On the forehead (temporal). Follow these instructions at home: Medicines  Take over-the-counter and prescription medicines only as told by your doctor. Follow the dosing instructions carefully.  If you were prescribed an antibiotic medicine, take it as told by your doctor. Do not stop taking it even if you start to feel better. General instructions  Watch for any changes in your symptoms. Tell your doctor about them.  Rest as needed.  Drink enough fluid to keep your pee (urine) pale yellow.  Sponge yourself or bathe with room-temperature water as needed. This helps to lower your body temperature. Do not use ice water.  Do not use too many blankets or wear clothes that are too heavy.  If your fever was caused by an infection that spreads from person to person (is contagious), such as a cold or the flu: ? You should stay home from work and public places for at least 24 hours after your fever is gone. ? Your fever should be gone for at least 24 hours without the need to use medicines. Contact a doctor if:  You throw up (vomit).  You cannot eat or drink without throwing up.  You have watery poop (diarrhea).  It  hurts when you pee.  Your symptoms do not get better with treatment.  You have new symptoms.  You feel very weak. Get help right away if:  You are short of breath or have trouble breathing.  You are dizzy or you pass out (faint).  You feel mixed up (confused).  You have signs of not having enough water in your body, such as: ? Dark pee, very little pee, or no pee. ? Cracked lips. ? Dry mouth. ? Sunken eyes. ? Sleepiness. ? Weakness.  You have very bad pain in your belly (abdomen).  You keep throwing up or having watery poop.  You have a rash on your skin.  Your symptoms get worse all of a sudden. Summary  A fever is an increase in your body's temperature. It often means a temperature of 100.4F (38C) or higher.  Watch for any changes in your symptoms. Tell your doctor about them.  Take all medicines only as told by your doctor.  Do not go to work or other public places if your fever was caused by an illness that can spread to other people.  Get help right away if you have signs that you do not have enough water in your body. This information is not intended to replace advice given to you by your health care provider. Make sure you discuss any questions you have with your health care provider. Document Revised: 08/16/2017   Document Reviewed: 08/16/2017 Elsevier Patient Education  2020 [employer]  

## 2020-01-08 NOTE — Telephone Encounter (Signed)
I have made the 2nd attempt to contact the patient or family member in charge, in order to follow up from recently being discharged from the hospital. I left a message on voicemail requesting a CB. -MM  

## 2020-01-09 ENCOUNTER — Other Ambulatory Visit: Payer: Self-pay | Admitting: *Deleted

## 2020-01-09 ENCOUNTER — Telehealth: Payer: Self-pay

## 2020-01-09 NOTE — Progress Notes (Deleted)
[place]   Telephone:(336) [phone] Fax:(336) [phone]   Clinic Follow up Note   Patient Care Team: [name], MD as PCP - General (Family Medicine) [name], [name], MD as PCP - Cardiology (Cardiology) [name], [name], MD as Consulting Physician (Cardiology) [name], MD as Consulting Physician (Obstetrics and Gynecology) [name], [name], NP as Nurse Practitioner (Hematology and Oncology) [facility] [name], MD as Consulting Physician (Hematology) [name], [name], MD as Consulting Physician (Gastroenterology) Virgina Evener, Dawn, RN (Inactive) as Oncology Nurse Navigator [name], MD as Consulting Physician (General Surgery) [name], NP as [place] [facility] 01/09/2020  CHIEF COMPLAINT:   SUMMARY OF ONCOLOGIC HISTORY: Oncology History Overview Note  Cancer Staging Intrahepatic cholangiocarcinoma ([place]) Staging form: Intrahepatic Bile Duct, AJCC 8th Edition - Clinical stage from 09/23/2018: Stage IB (cT1b, cN0, cM0) - Signed by [name], MD on 10/06/2018    Malignant neoplasm of female breast ([place])  11/06/2018 Genetic Testing   Negative genetic testing on the [employer] Common Hereditary Cancers panel. A variant of uncertain significance was identified in one of her APC genes, called c.1243G>A (p.Ala415Thr).  The Common Hereditary Cancers Panel offered by [employer] includes sequencing and/or deletion duplication testing of the following 48 genes: APC, ATM, AXIN2, BARD1, BMPR1A, BRCA1, BRCA2, BRIP1, CDH1, CDK4, CDKN2A (p14ARF), CDKN2A (p16INK4a), CHEK2, CTNNA1, DICER1, EPCAM (Deletion/duplication testing only), GREM1 (promoter region deletion/duplication testing only), KIT, MEN1, MLH1, MSH2, MSH3, MSH6, MUTYH, NBN, NF1, NHTL1, PALB2, PDGFRA, PMS2, POLD1, POLE, PTEN, RAD50, RAD51C, RAD51D, RNF43, SDHB, SDHC, SDHD, SMAD4, SMARCA4. STK11, TP53, TSC1, TSC2, and VHL.  The following genes were  evaluated for sequence changes only: SDHA and HOXB13 c.251G>A variant only.    Intrahepatic cholangiocarcinoma ([place])  09/07/2018 Imaging   CT Chest IMPRESSION: 1. New, enhancing mass involving segment 4 of the liver and fundus of gallbladder is concerning for malignancy. This may represent either metastatic disease from breast cancer or neoplasm primary to the liver or hepatic biliary tree. Further evaluation with contrast enhanced CT of the abdomen and pelvis is recommended. 2. No findings to suggest metastatic disease within the chest. 3.  Aortic Atherosclerosis (ICD10-I70.0). 4. Coronary artery calcifications.   09/13/2018 Pathology Results   Diagnosis Liver, needle/core biopsy - ADENOCARCINOMA. Microscopic Comment Immunohistochemistry for CK7 is positive. CK20, TTF1, CDX-2, GATA-3, PAX 8, Qualitative ER, p63 and CK5/6 are negative. The provided clinical history of remote mammary carcinoma is noted. Based on the morphology and immunophenotype of the adenocarcinoma observed in this specimen, primary cholangiocarcinoma is favored. Clinical and radiologic correlation are  encouraged. Results reported to [employer] on 09/15/2018. Intradepartmental consultation (Dr. Vic Ripper).   09/13/2018 Initial Diagnosis   Cholangiocarcinoma ([place])   09/23/2018 Procedure   Colonoscopy by Dr. Havery Moros 09/23/18  IMPRESSION - Two 3 to 4 mm polyps in the ascending colon, removed with a cold snare. Resected and retrieved. - Five 3 to 5 mm polyps in the transverse colon, removed with a cold snare. Resected and retrieved. - One 5 mm polyp at the splenic flexure, removed with a cold snare. Resected and retrieved. - Three 3 to 5 mm polyps in the sigmoid colon, removed with a cold snare. Resected and retrieved. - The examination was otherwise normal. Upper Endopscy by Dr. Havery Moros 09/23/18  IMPRESSION - Esophagogastric landmarks identified. - 2 cm hiatal hernia. - Normal esophagus otherwise. - A single  gastric polyp. Resected and retrieved. - Mild gastritis. Biopsied. - Normal duodenal bulb and second portion of the duodenum.  09/23/2018 Pathology Results   Diagnosis 09/23/18 1. Surgical [P], duodenum - BENIGN SMALL BOWEL MUCOSA. - NO ACTIVE INFLAMMATION OR VILLOUS ATROPHY IDENTIFIED. 2. Surgical [P], stomach, polyp - HYPERPLASTIC POLYP(S). - THERE IS NO EVIDENCE OF MALIGNANCY. 3. Surgical [P], gastric antrum and gastric body - CHRONIC INACTIVE GASTRITIS. - THERE IS NO EVIDENCE OF HELICOBACTER-PYLORI, DYSPLASIA, OR MALIGNANCY. - SEE COMMENT. 4. Surgical [P], colon, sigmoid, splenic flexure, transverse and ascending, polyp (9) - TUBULAR ADENOMA(S). - SESSILE SERRATED POLYP WITHOUT CYTOLOGIC DYSPLASIA. - HIGH GRADE DYSPLASIA IS NOT IDENTIFIED. 5. Surgical [P], colon, sigmoid, polyp (2) - HYPERPLASTIC POLYP(S). - THERE IS NO EVIDENCE OF MALIGNANCY.   09/23/2018 Cancer Staging   Staging form: Intrahepatic Bile Duct, AJCC 8th Edition - Clinical stage from 09/23/2018: Stage IB (cT1b, cN0, cM0) - Signed by [name], MD on 10/06/2018   09/29/2018 PET scan   PET 09/29/18 IMPRESSION: 1. Hypermetabolic mass in the RIGHT hepatic lobe consistent with biopsy proven adenocarcinoma. No additional liver metastasis. 2. No evidence of local breast cancer recurrence in the RIGHT breast or RIGHT axilla. 3. Mild bilateral hypermetabolic adrenal glands is favored benign hyperplasia. 4. No evidence of additional metastatic disease on skull base to thigh FDG PET scan.   11/06/2018 Genetic Testing   Negative genetic testing on the [employer] Common Hereditary Cancers panel. A variant of uncertain significance was identified in one of her APC genes, called c.1243G>A (p.Ala415Thr).  The Common Hereditary Cancers Panel offered by [facility] includes sequencing and/or deletion duplication testing of the following 48 genes: APC, ATM, AXIN2, BARD1, BMPR1A, BRCA1, BRCA2, BRIP1, CDH1, CDK4, CDKN2A (p14ARF), CDKN2A  (p16INK4a), CHEK2, CTNNA1, DICER1, EPCAM (Deletion/duplication testing only), GREM1 (promoter region deletion/duplication testing only), KIT, MEN1, MLH1, MSH2, MSH3, MSH6, MUTYH, NBN, NF1, NHTL1, PALB2, PDGFRA, PMS2, POLD1, POLE, PTEN, RAD50, RAD51C, RAD51D, RNF43, SDHB, SDHC, SDHD, SMAD4, SMARCA4. STK11, TP53, TSC1, TSC2, and VHL.  The following genes were evaluated for sequence changes only: SDHA and HOXB13 c.251G>A variant only.    11/17/2018 Pathology Results   Diagnosis 11/17/18 1. Soft tissue, biopsy, Diaphragmatic nodules - METASTATIC ADENOCARCINOMA, CONSISTENT WITH PATIENT'S CLINICAL HISTORY OF CHOLANGIOCARCINOMA. SEE NOTE 2. Liver, biopsy, Left - LIVER PARENCHYMA WITH A BENIGN FIBROTIC NODULE - NO EVIDENCE OF MALIGNANCY 3. Stomach, biopsy - BENIGN PAPILLARY MESOTHELIAL HYPERPLASIA - NO EVIDENCE OF MALIGNANCY   11/29/2018 Imaging   CT CAP WO Contrast  IMPRESSION: 1. Dominant liver mass appears grossly stable from 09/29/2018. Additional liver lesions are too small to characterize but were not shown to be hypermetabolic on PET. 2. Mild nodularity of both adrenal glands with associated hypermetabolism on 09/29/2018. Continued attention on follow-up exams is warranted. 3. Small right lower lobe nodules, stable from 09/07/2018. Again, attention on follow-up is recommended. 4. Trace bilateral pleural fluid. 5. Aortic atherosclerosis (ICD10-170.0). Coronary artery calcification. 6. Enlarged pulmonic trunk, indicative of pulmonary arterial hypertension.     11/30/2018 - 06/14/2019 Chemotherapy   First line chemo Oxaliplatin and gemcitabine q2weeks starting 11/30/18. Stopped Oxaliplatin on 05/03/19 due to worsening neuropathy. Added Cisplatin with C13 on 05/17/19 and stopped on C15 06/14/19 due to neuropathy. Reduced to maintenance single agent Gemcitabine on 06/28/19.    01/20/2019 Imaging   CT CAP IMPRESSION: restaging  1. Mild interval increase in size of dominant liver mass  involving segment 4 and segment 5. 2. No significant or progressive adrenal nodularity identified to suggest metastatic disease. 3. Unchanged appearance of small right lower lobe and lingular lung nodules, nonspecific.   03/21/2019 PET scan   IMPRESSION:  1. Large right hepatic mass with SUV uptake near background hepatic activity, dramatic response to therapy, also with decrease in size when compared to the prior study. 2. Signs of prior right mastectomy and axillary dissection as before. 3. Left adrenal activity in no longer above the level of activity seen in the contralateral, right adrenal gland or in the liver. Attention on follow-up. 4. No new signs of disease.   06/27/2019 PET scan   IMPRESSION: 1. Further decrease in size of a dominant hepatic mass which is non FDG avid. 2. No evidence of metastatic disease. 3. No evidence of left adrenal hypermetabolism or mass. 4. Incidental findings, including: Uterine fibroids. Coronary artery atherosclerosis. Aortic Atherosclerosis (ICD10-I70.0). Pulmonary artery enlargement suggests pulmonary arterial hypertension.   06/28/2019 -  Chemotherapy   Maintenance single agent Gemcitabine q2weeks  starting on 06/28/19 with C16.    09/15/2019 PET scan   IMPRESSION: 1. In the region of regional tumor at the junction of the right and left hepatic lobe, there is continued hypodensity and overall activity slightly less than that of the surrounding normal liver, indicating effective response to therapy. No current worrisome hypermetabolic lesion is identified in the liver or elsewhere. 2. Chronic asymmetric edema in the subcutaneous tissues of the right upper extremity, nonspecific. The patient has had a prior right mastectomy and right axillary dissection. 3. Other imaging findings of potential clinical significance: Aortic Atherosclerosis (ICD10-I70.0). Coronary atherosclerosis. Mild cardiomegaly. Small right and trace left pleural  effusions. Subcutaneous and mild mesenteric edema. Suspected uterine fibroids.   12/18/2019 PET scan   IMPRESSION: 1. No significant abnormal activity to suggest active/recurrent malignancy. 2. Lingual tonsillar activity is mildly increased from prior but probably incidental/physiologic, attention on follow up suggested. 3. Other imaging findings of potential clinical significance: Third spacing of fluid with trace ascites, mesenteric edema, subcutaneous edema, and possibly subtle pulmonary edema. Mild cardiomegaly. Aortic Atherosclerosis (ICD10-I70.0). Coronary atherosclerosis. Right ovarian dermoid. Calcified uterine fibroids. Renal cysts. Right glenohumeral arthropathy. Mild to moderate scoliosis. Suspected pulmonary arterial hypertension.     CURRENT THERAPY:   INTERVAL HISTORY:   REVIEW OF SYSTEMS:   Constitutional: Denies fevers, chills or abnormal weight loss Eyes: Denies blurriness of vision Ears, nose, mouth, throat, and face: Denies mucositis or sore throat Respiratory: Denies cough, dyspnea or wheezes Cardiovascular: Denies palpitation, chest discomfort or lower extremity swelling Gastrointestinal:  Denies nausea, heartburn or change in bowel habits Skin: Denies abnormal skin rashes Lymphatics: Denies new lymphadenopathy or easy bruising Neurological:Denies numbness, tingling or new weaknesses Behavioral/Psych: Mood is stable, no new changes  All other systems were reviewed with the patient and are negative.  MEDICAL HISTORY:  Past Medical History:  Diagnosis Date  . Anemia   . Anxiety   . Arthritis   . Breast cancer ([place]) 1992  . Cataract    Bilateral eyes - surgery to remove  . Chronic kidney disease    CKD stage 3  . Complication of anesthesia    "something they use make me itch for a couple of days."  . Depression   . Diabetes mellitus    type 2  . Duodenitis 01/18/2002  . Fainting spell   . Family history of breast cancer   . Family history of  prostate cancer   . GERD (gastroesophageal reflux disease)   . Heart murmur    never has caused any problems  . Hiatal hernia 08/08/2008, 01/18/2002  . History of pneumonia    x 2  . Hyperlipidemia   . Hypertension   .  Liver cancer ([place]) 08/2018  . Lymphedema 2017   Right arm  . Pneumonia    x 2  . UTI (lower urinary tract infection)     SURGICAL HISTORY: Past Surgical History:  Procedure Laterality Date  . AXILLARY SURGERY     cyst removal, right  . COLONOSCOPY  09/23/2018   Dr. Havery Moros - polyps  . EYE SURGERY Bilateral    cataracts to remove  . LAPAROSCOPY N/A 11/17/2018   Procedure: LAPAROSCOPY DIAGNOSTIC, INTRAOPERATIVE ULTRASOUND, PERITONEAL BIOPSIES;  Surgeon: [name], MD;  Location: [place];  Service: General;  Laterality: N/A;  GENERAL AND EPIDURAL  . LIVER BIOPSY  08/2018   Dr. Lindwood Coke  . MASTECTOMY  1992   right, with flap  . NM MYOCAR PERF WALL MOTION  06/11/2009   Protocol:[place], post stress EF58%, EKG negative for ischemia, low risk  . PORTACATH PLACEMENT N/A 12/02/2018   Procedure: INSERTION PORT-A-CATH;  Surgeon: [name], MD;  Location: [place];  Service: General;  Laterality: N/A;  . RECONSTRUCTION BREAST W/ TRAM FLAP Right   . TONSILLECTOMY    . TRANSTHORACIC ECHOCARDIOGRAM  12/24/2009   LVEF =>55%, normal study  . UPPER GI ENDOSCOPY      I have reviewed the social history and family history with the patient and they are unchanged from previous note.  ALLERGIES:  has No Known Allergies.  MEDICATIONS:  Current Outpatient Medications  Medication Sig Dispense Refill  . amLODipine (NORVASC) 10 MG tablet Take 1 tablet (10 mg total) by mouth daily. 90 tablet 3  . aspirin 81 MG tablet Take 81 mg by mouth daily.     . furosemide (LASIX) 40 MG tablet Take 1 tablet (40 mg total) by mouth 2 (two) times a week. (Patient taking differently: Take 40 mg by mouth See admin instructions. 1 tablet ($RemoveB'40mg'vJRYEuUv$ )  on Tuesday, Thursdays, Fridays, Saturday and Sunday.)  10 tablet 2  . glucose blood test strip 1 each by Other route 2 (two) times daily. Use Onetouch verio test strips as instructed to check blood sugar twice daily. 100 each 2  . hydrALAZINE (APRESOLINE) 50 MG tablet Take 1 tablet (50 mg total) by mouth every 8 (eight) hours. 90 tablet 0  . hydrochlorothiazide (MICROZIDE) 12.5 MG capsule Take 1 capsule (12.5 mg total) by mouth daily. 90 capsule 1  . lansoprazole (PREVACID) 15 MG capsule Take 1 capsule (15 mg total) by mouth daily. 90 capsule 1  . loratadine (CLARITIN) 10 MG tablet Take 10 mg by mouth daily as needed for allergies.    . metoprolol succinate (TOPROL-XL) 100 MG 24 hr tablet Take 1 tablet (100 mg total) by mouth daily. Take with or immediately following a meal. 90 tablet 1  . Omega 3 1000 MG CAPS Take 2,000 mg by mouth daily.     . potassium chloride (KLOR-CON) 10 MEQ tablet Take 1 tablet (10 mEq total) by mouth 2 (two) times a week. (Patient taking differently: Take 10 mEq by mouth daily. 1 tablet (84mEq)  on Tuesday, Thursdays, Fridays, Saturday and Sunday.) 10 tablet 2  . sitaGLIPtin (JANUVIA) 100 MG tablet Take 1 tablet (100 mg total) by mouth daily. 90 tablet 4  . valsartan (DIOVAN) 320 MG tablet Take 1 tablet (320 mg total) by mouth daily. 90 tablet 3   Current Facility-Administered Medications  Medication Dose Route Frequency Provider Last Rate Last Admin  . triamcinolone acetonide (KENALOG) 10 MG/ML injection 10 mg  10 mg Other Once Harriet Masson, DPM  PHYSICAL EXAMINATION: ECOG PERFORMANCE STATUS: {CHL ONC ECOG [phone]}  There were no vitals filed for this visit. There were no vitals filed for this visit.  GENERAL:alert, no distress and comfortable SKIN: skin color, texture, turgor are normal, no rashes or significant lesions EYES: normal, Conjunctiva are pink and non-injected, sclera clear OROPHARYNX:no exudate, no erythema and lips, buccal mucosa, and tongue normal  NECK: supple, thyroid normal size,  non-tender, without nodularity LYMPH:  no palpable lymphadenopathy in the cervical, axillary or inguinal LUNGS: clear to auscultation and percussion with normal breathing effort HEART: regular rate & rhythm and no murmurs and no lower extremity edema ABDOMEN:abdomen soft, non-tender and normal bowel sounds Musculoskeletal:no cyanosis of digits and no clubbing  NEURO: alert & oriented x 3 with fluent speech, no focal motor/sensory deficits  LABORATORY DATA:  I have reviewed the data as listed CBC Latest Ref Rng & Units 01/05/2020 01/04/2020 01/04/2020  WBC 4.0 - 10.5 K/uL 7.9 - 7.4  Hemoglobin 12.0 - 15.0 g/dL 9.1(L) 9.3(L) 7.2(L)  Hematocrit 36 - 46 % 28.0(L) 28.3(L) 22.6(L)  Platelets 150 - 400 K/uL 142(L) - 132(L)     CMP Latest Ref Rng & Units 01/05/2020 01/04/2020 01/03/2020  Glucose 70 - 99 mg/dL 101(H) 125(H) 90  BUN 8 - 23 mg/dL 49(H) 57(H) 61(H)  Creatinine 0.44 - 1.00 mg/dL 1.86(H) 2.02(H) 2.05(H)  Sodium 135 - 145 mmol/L 142 140 137  Potassium 3.5 - 5.1 mmol/L 3.8 3.9 3.9  Chloride 98 - 111 mmol/L 110 106 104  CO2 22 - 32 mmol/L $RemoveB'23 23 22  'NFgxSyOH$ Calcium 8.9 - 10.3 mg/dL 8.3(L) 8.2(L) 8.3(L)  Total Protein 6.5 - 8.1 g/dL - - 5.5(L)  Total Bilirubin 0.3 - 1.2 mg/dL - - 0.9  Alkaline Phos 38 - 126 U/L - - 61  AST 15 - 41 U/L - - 21  ALT 0 - 44 U/L - - 12      RADIOGRAPHIC STUDIES: I have personally reviewed the radiological images as listed and agreed with the findings in the report. No results found.   ASSESSMENT & PLAN:  No problem-specific Assessment & Plan notes found for this encounter.   No orders of the defined types were placed in this encounter.  All questions were answered. The patient knows to call the clinic with any problems, questions or concerns. No barriers to learning was detected. I spent {CHL ONC TIME VISIT - NVVYX:[id number]} counseling the patient face to face. The total time spent in the appointment was {CHL ONC TIME VISIT - MQTTC:[id number]} and  more than 50% was on counseling and review of test results     Alla Feeling, NP 01/09/20

## 2020-01-09 NOTE — Telephone Encounter (Signed)
1014 am.  Phone call made to Donna May of patient.  No answer but I have left a message requesting a call back.  Response pending.

## 2020-01-09 NOTE — Patient Outreach (Signed)
[place] [facility]) Care Management  01/09/2020  Donna May 24-Jul-1940 [id number]  Telephone assessment PAC for HF  Pt was discharged home on Friday and will be having home health.  No answer today, left a message and requested a return call.  [name]. [name], MSN, [facility] Gerontological Nurse Practitioner [facility] Care Management [phone]

## 2020-01-10 ENCOUNTER — Inpatient Hospital Stay: Payer: Medicare Other | Admitting: Nurse Practitioner

## 2020-01-10 ENCOUNTER — Inpatient Hospital Stay: Payer: Medicare Other

## 2020-01-10 ENCOUNTER — Other Ambulatory Visit: Payer: Self-pay | Admitting: *Deleted

## 2020-01-10 NOTE — Telephone Encounter (Signed)
Patient has [employer] does not qualify for TCM

## 2020-01-10 NOTE — Patient Outreach (Signed)
[place] [facility]) Care Management  01/10/2020  [name] May 03-22-1940 [id number]  Pt home from 9 day hospitalization for CHF.  Denies SOB, edema.  Not weighing. She does have a scale. She reports she has lost 25# however, throughout her illnesses. Uses a small amount of sea salt. Goes out or has food brought in. She is taking her meds. Lasix and potassium only 2 times a week. She can only remember to take her hydralazine twice a day.  Patient was recently discharged from hospital and all medications have been reviewed. Outpatient Encounter Medications as of 01/10/2020  Medication Sig Note  . amLODipine (NORVASC) 10 MG tablet Take 1 tablet (10 mg total) by mouth daily.   Marland Kitchen aspirin 81 MG tablet Take 81 mg by mouth daily.    . furosemide (LASIX) 40 MG tablet Take 1 tablet (40 mg total) by mouth 2 (two) times a week. (Patient taking differently: Take 40 mg by mouth See admin instructions. 1 tablet (40mg )  on Tuesday, Thursdays, Fridays, Saturday and Sunday.)   . glucose blood test strip 1 each by Other route 2 (two) times daily. Use Onetouch verio test strips as instructed to check blood sugar twice daily. 10/30/2019: Not checking home glucose because of UE neuropathy and last HgbA1C was 4.+.  Marland Kitchen hydrALAZINE (APRESOLINE) 50 MG tablet Take 1 tablet (50 mg total) by mouth every 8 (eight) hours. 01/10/2020: Takes twice a day.  . hydrochlorothiazide (MICROZIDE) 12.5 MG capsule Take 1 capsule (12.5 mg total) by mouth daily.   . lansoprazole (PREVACID) 15 MG capsule Take 1 capsule (15 mg total) by mouth daily.   Marland Kitchen loratadine (CLARITIN) 10 MG tablet Take 10 mg by mouth daily as needed for allergies.   . metoprolol succinate (TOPROL-XL) 100 MG 24 hr tablet Take 1 tablet (100 mg total) by mouth daily. Take with or immediately following a meal.   . Omega 3 1000 MG CAPS Take 2,000 mg by mouth daily.    . potassium chloride (KLOR-CON) 10 MEQ tablet Take 1 tablet (10 mEq total) by mouth 2 (two)  times a week. (Patient taking differently: Take 10 mEq by mouth daily. 1 tablet (9mEq)  on Tuesday, Thursdays, Fridays, Saturday and Sunday.)   . sitaGLIPtin (JANUVIA) 100 MG tablet Take 1 tablet (100 mg total) by mouth daily.   . valsartan (DIOVAN) 320 MG tablet Take 1 tablet (320 mg total) by mouth daily.    Facility-Administered Encounter Medications as of 01/10/2020  Medication  . triamcinolone acetonide (KENALOG) 10 MG/ML injection 10 mg   She doesn't check her glucose on a regular basis. Her last Hgb A1C was 4.9. She continues to take Januvia 100 mg daily.  She will resume her ca treatments in 2 weeks when she gains some strength back.  Her family shops for her right now. She is staying with her daughter. She will be able to drive herself to appts.   She sounds as though she is in good spirits.  PLAN: Educated on HF Action Plan. Will Send [facility] Calendar for her to record her wts. Also low salt visual aid. Will call her again in one week.  Donna May. Donna May Neither, MSN, [facility] Gerontological Nurse Practitioner [facility] Care Management [phone]

## 2020-01-12 NOTE — Progress Notes (Signed)
[place]   Telephone:([phone] Fax:(336) [phone]   Clinic Follow up Note   Patient Care Team: [name], MD as PCP - General (Family Medicine) [name], [name], MD as PCP - Cardiology (Cardiology) [name], [name], MD as Consulting Physician (Cardiology) [name], MD as Consulting Physician (Obstetrics and Gynecology) [name], [name], NP as Nurse Practitioner (Hematology and Oncology) [facility] [name], MD as Consulting Physician (Hematology) [name], [name], MD as Consulting Physician (Gastroenterology) Virgina Evener, Dawn, RN (Inactive) as Oncology Nurse Navigator [name], MD as Consulting Physician (General Surgery) [name], NP as [facility]  Date of Service:  01/15/2020  CHIEF COMPLAINT: F/u ofcholangiocarcinomaof liver  SUMMARY OF ONCOLOGIC HISTORY: Oncology History Overview Note  Cancer Staging Intrahepatic cholangiocarcinoma ([place]) Staging form: Intrahepatic Bile Duct, AJCC 8th Edition - Clinical stage from 09/23/2018: Stage IB (cT1b, cN0, cM0) - Signed by [name], MD on 10/06/2018    Malignant neoplasm of female breast ([place])  11/06/2018 Genetic Testing   Negative genetic testing on the [facility] Common Hereditary Cancers panel. A variant of uncertain significance was identified in one of her APC genes, called c.1243G>A (p.Ala415Thr).  The Common Hereditary Cancers Panel offered by [facility] includes sequencing and/or deletion duplication testing of the following 48 genes: APC, ATM, AXIN2, BARD1, BMPR1A, BRCA1, BRCA2, BRIP1, CDH1, CDK4, CDKN2A (p14ARF), CDKN2A (p16INK4a), CHEK2, CTNNA1, DICER1, EPCAM (Deletion/duplication testing only), GREM1 (promoter region deletion/duplication testing only), KIT, MEN1, MLH1, MSH2, MSH3, MSH6, MUTYH, NBN, NF1, NHTL1, PALB2, PDGFRA, PMS2, POLD1, POLE, PTEN, RAD50, RAD51C, RAD51D, RNF43, SDHB, SDHC, SDHD, SMAD4, SMARCA4. STK11, TP53,  TSC1, TSC2, and VHL.  The following genes were evaluated for sequence changes only: SDHA and HOXB13 c.251G>A variant only.    Intrahepatic cholangiocarcinoma ([place])  09/07/2018 Imaging   CT Chest IMPRESSION: 1. New, enhancing mass involving segment 4 of the liver and fundus of gallbladder is concerning for malignancy. This may represent either metastatic disease from breast cancer or neoplasm primary to the liver or hepatic biliary tree. Further evaluation with contrast enhanced CT of the abdomen and pelvis is recommended. 2. No findings to suggest metastatic disease within the chest. 3.  Aortic Atherosclerosis (ICD10-I70.0). 4. Coronary artery calcifications.   09/13/2018 Pathology Results   Diagnosis Liver, needle/core biopsy - ADENOCARCINOMA. Microscopic Comment Immunohistochemistry for CK7 is positive. CK20, TTF1, CDX-2, GATA-3, PAX 8, Qualitative ER, p63 and CK5/6 are negative. The provided clinical history of remote mammary carcinoma is noted. Based on the morphology and immunophenotype of the adenocarcinoma observed in this specimen, primary cholangiocarcinoma is favored. Clinical and radiologic correlation are  encouraged. Results reported to [employer] on 09/15/2018. Intradepartmental consultation (Dr. Vic Ripper).   09/13/2018 Initial Diagnosis   Cholangiocarcinoma ([place])   09/23/2018 Procedure   Colonoscopy by Dr. Havery Moros 09/23/18  IMPRESSION - Two 3 to 4 mm polyps in the ascending colon, removed with a cold snare. Resected and retrieved. - Five 3 to 5 mm polyps in the transverse colon, removed with a cold snare. Resected and retrieved. - One 5 mm polyp at the splenic flexure, removed with a cold snare. Resected and retrieved. - Three 3 to 5 mm polyps in the sigmoid colon, removed with a cold snare. Resected and retrieved. - The examination was otherwise normal. Upper Endopscy by Dr. Havery Moros 09/23/18  IMPRESSION - Esophagogastric landmarks identified. - 2 cm hiatal  hernia. - Normal esophagus otherwise. - A single gastric polyp. Resected and retrieved. - Mild gastritis. Biopsied. - Normal duodenal bulb and  second portion of the duodenum.   09/23/2018 Pathology Results   Diagnosis 09/23/18 1. Surgical [P], duodenum - BENIGN SMALL BOWEL MUCOSA. - NO ACTIVE INFLAMMATION OR VILLOUS ATROPHY IDENTIFIED. 2. Surgical [P], stomach, polyp - HYPERPLASTIC POLYP(S). - THERE IS NO EVIDENCE OF MALIGNANCY. 3. Surgical [P], gastric antrum and gastric body - CHRONIC INACTIVE GASTRITIS. - THERE IS NO EVIDENCE OF HELICOBACTER-PYLORI, DYSPLASIA, OR MALIGNANCY. - SEE COMMENT. 4. Surgical [P], colon, sigmoid, splenic flexure, transverse and ascending, polyp (9) - TUBULAR ADENOMA(S). - SESSILE SERRATED POLYP WITHOUT CYTOLOGIC DYSPLASIA. - HIGH GRADE DYSPLASIA IS NOT IDENTIFIED. 5. Surgical [P], colon, sigmoid, polyp (2) - HYPERPLASTIC POLYP(S). - THERE IS NO EVIDENCE OF MALIGNANCY.   09/23/2018 Cancer Staging   Staging form: Intrahepatic Bile Duct, AJCC 8th Edition - Clinical stage from 09/23/2018: Stage IB (cT1b, cN0, cM0) - Signed by [name], MD on 10/06/2018   09/29/2018 PET scan   PET 09/29/18 IMPRESSION: 1. Hypermetabolic mass in the RIGHT hepatic lobe consistent with biopsy proven adenocarcinoma. No additional liver metastasis. 2. No evidence of local breast cancer recurrence in the RIGHT breast or RIGHT axilla. 3. Mild bilateral hypermetabolic adrenal glands is favored benign hyperplasia. 4. No evidence of additional metastatic disease on skull base to thigh FDG PET scan.   11/06/2018 Genetic Testing   Negative genetic testing on the [facility] Common Hereditary Cancers panel. A variant of uncertain significance was identified in one of her APC genes, called c.1243G>A (p.Ala415Thr).  The Common Hereditary Cancers Panel offered by [facility] includes sequencing and/or deletion duplication testing of the following 48 genes: APC, ATM, AXIN2, BARD1, BMPR1A, BRCA1,  BRCA2, BRIP1, CDH1, CDK4, CDKN2A (p14ARF), CDKN2A (p16INK4a), CHEK2, CTNNA1, DICER1, EPCAM (Deletion/duplication testing only), GREM1 (promoter region deletion/duplication testing only), KIT, MEN1, MLH1, MSH2, MSH3, MSH6, MUTYH, NBN, NF1, NHTL1, PALB2, PDGFRA, PMS2, POLD1, POLE, PTEN, RAD50, RAD51C, RAD51D, RNF43, SDHB, SDHC, SDHD, SMAD4, SMARCA4. STK11, TP53, TSC1, TSC2, and VHL.  The following genes were evaluated for sequence changes only: SDHA and HOXB13 c.251G>A variant only.    11/17/2018 Pathology Results   Diagnosis 11/17/18 1. Soft tissue, biopsy, Diaphragmatic nodules - METASTATIC ADENOCARCINOMA, CONSISTENT WITH PATIENT'S CLINICAL HISTORY OF CHOLANGIOCARCINOMA. SEE NOTE 2. Liver, biopsy, Left - LIVER PARENCHYMA WITH A BENIGN FIBROTIC NODULE - NO EVIDENCE OF MALIGNANCY 3. Stomach, biopsy - BENIGN PAPILLARY MESOTHELIAL HYPERPLASIA - NO EVIDENCE OF MALIGNANCY   11/29/2018 Imaging   CT CAP WO Contrast  IMPRESSION: 1. Dominant liver mass appears grossly stable from 09/29/2018. Additional liver lesions are too small to characterize but were not shown to be hypermetabolic on PET. 2. Mild nodularity of both adrenal glands with associated hypermetabolism on 09/29/2018. Continued attention on follow-up exams is warranted. 3. Small right lower lobe nodules, stable from 09/07/2018. Again, attention on follow-up is recommended. 4. Trace bilateral pleural fluid. 5. Aortic atherosclerosis (ICD10-170.0). Coronary artery calcification. 6. Enlarged pulmonic trunk, indicative of pulmonary arterial hypertension.     11/30/2018 - 06/14/2019 Chemotherapy   First line chemo Oxaliplatin and gemcitabine q2weeks starting 11/30/18. Stopped Oxaliplatin on 05/03/19 due to worsening neuropathy. Added Cisplatin with C13 on 05/17/19 and stopped on C15 06/14/19 due to neuropathy. Reduced to maintenance single agent Gemcitabine on 06/28/19.    01/20/2019 Imaging   CT CAP IMPRESSION: restaging  1. Mild interval  increase in size of dominant liver mass involving segment 4 and segment 5. 2. No significant or progressive adrenal nodularity identified to suggest metastatic disease. 3. Unchanged appearance of small right lower lobe and lingular lung nodules, nonspecific.  03/21/2019 PET scan   IMPRESSION: 1. Large right hepatic mass with SUV uptake near background hepatic activity, dramatic response to therapy, also with decrease in size when compared to the prior study. 2. Signs of prior right mastectomy and axillary dissection as before. 3. Left adrenal activity in no longer above the level of activity seen in the contralateral, right adrenal gland or in the liver. Attention on follow-up. 4. No new signs of disease.   06/27/2019 PET scan   IMPRESSION: 1. Further decrease in size of a dominant hepatic mass which is non FDG avid. 2. No evidence of metastatic disease. 3. No evidence of left adrenal hypermetabolism or mass. 4. Incidental findings, including: Uterine fibroids. Coronary artery atherosclerosis. Aortic Atherosclerosis (ICD10-I70.0). Pulmonary artery enlargement suggests pulmonary arterial hypertension.   06/28/2019 -  Chemotherapy   Maintenance single agent Gemcitabine q2weeks  starting on 06/28/19 with C16.    09/15/2019 PET scan   IMPRESSION: 1. In the region of regional tumor at the junction of the right and left hepatic lobe, there is continued hypodensity and overall activity slightly less than that of the surrounding normal liver, indicating effective response to therapy. No current worrisome hypermetabolic lesion is identified in the liver or elsewhere. 2. Chronic asymmetric edema in the subcutaneous tissues of the right upper extremity, nonspecific. The patient has had a prior right mastectomy and right axillary dissection. 3. Other imaging findings of potential clinical significance: Aortic Atherosclerosis (ICD10-I70.0). Coronary atherosclerosis. Mild cardiomegaly. Small  right and trace left pleural effusions. Subcutaneous and mild mesenteric edema. Suspected uterine fibroids.   12/18/2019 PET scan   IMPRESSION: 1. No significant abnormal activity to suggest active/recurrent malignancy. 2. Lingual tonsillar activity is mildly increased from prior but probably incidental/physiologic, attention on follow up suggested. 3. Other imaging findings of potential clinical significance: Third spacing of fluid with trace ascites, mesenteric edema, subcutaneous edema, and possibly subtle pulmonary edema. Mild cardiomegaly. Aortic Atherosclerosis (ICD10-I70.0). Coronary atherosclerosis. Right ovarian dermoid. Calcified uterine fibroids. Renal cysts. Right glenohumeral arthropathy. Mild to moderate scoliosis. Suspected pulmonary arterial hypertension.      CURRENT THERAPY:  Maintenance single agent Gemcitabine q2weeks starting on 06/28/19 with C16.  INTERVAL HISTORY:  Donna May is here for a follow up. She presents to the clinic alone.  She was hospitalized 10/12 to 10/22 for CHF exacerbation.  She is doing well overall after hospital discharge.  She denies any orthopnea, mild dyspnea on exertion, no cough or chest pain.  Mild bilateral lower extremity edema is stable.  She takes Lasix twice a week.  She denies any significant pain, no nausea vomiting, bowel movement is normal.  She is able to do most of her ADLs, still has moderate to severe neuropathy but overall stable.  She does not cook, buys food from restaurants in a store most of time, appetite is decent, weight is stable.   All other systems were reviewed with the patient and are negative.  MEDICAL HISTORY:  Past Medical History:  Diagnosis Date  . Anemia   . Anxiety   . Arthritis   . Breast cancer ([place]) 1992  . Cataract    Bilateral eyes - surgery to remove  . Chronic kidney disease    CKD stage 3  . Complication of anesthesia    "something they use make me itch for a couple of days."  .  Depression   . Diabetes mellitus    type 2  . Duodenitis 01/18/2002  . Fainting spell   . Family history  of breast cancer   . Family history of prostate cancer   . GERD (gastroesophageal reflux disease)   . Heart murmur    never has caused any problems  . Hiatal hernia 08/08/2008, 01/18/2002  . History of pneumonia    x 2  . Hyperlipidemia   . Hypertension   . Liver cancer ([place]) 08/2018  . Lymphedema 2017   Right arm  . Pneumonia    x 2  . UTI (lower urinary tract infection)     SURGICAL HISTORY: Past Surgical History:  Procedure Laterality Date  . AXILLARY SURGERY     cyst removal, right  . COLONOSCOPY  09/23/2018   Dr. Havery Moros - polyps  . EYE SURGERY Bilateral    cataracts to remove  . LAPAROSCOPY N/A 11/17/2018   Procedure: LAPAROSCOPY DIAGNOSTIC, INTRAOPERATIVE ULTRASOUND, PERITONEAL BIOPSIES;  Surgeon: [name], MD;  Location: [facility];  Service: General;  Laterality: N/A;  GENERAL AND EPIDURAL  . LIVER BIOPSY  08/2018   Dr. Lindwood Coke  . MASTECTOMY  1992   right, with flap  . NM MYOCAR PERF WALL MOTION  06/11/2009   Protocol:[place], post stress EF58%, EKG negative for ischemia, low risk  . PORTACATH PLACEMENT N/A 12/02/2018   Procedure: INSERTION PORT-A-CATH;  Surgeon: [name], MD;  Location: [facility];  Service: General;  Laterality: N/A;  . RECONSTRUCTION BREAST W/ TRAM FLAP Right   . TONSILLECTOMY    . TRANSTHORACIC ECHOCARDIOGRAM  12/24/2009   LVEF =>55%, normal study  . UPPER GI ENDOSCOPY      I have reviewed the social history and family history with the patient and they are unchanged from previous note.  ALLERGIES:  has No Known Allergies.  MEDICATIONS:  Current Outpatient Medications  Medication Sig Dispense Refill  . amLODipine (NORVASC) 10 MG tablet Take 1 tablet (10 mg total) by mouth daily. 90 tablet 3  . aspirin 81 MG tablet Take 81 mg by mouth daily.     . furosemide (LASIX) 40 MG tablet Take 1 tablet (40 mg total) by mouth 2 (two) times a  week. (Patient taking differently: Take 40 mg by mouth See admin instructions. 1 tablet ($RemoveB'40mg'KQCupPMw$ )  on Tuesday, Thursdays, Fridays, Saturday and Sunday.) 10 tablet 2  . glucose blood test strip 1 each by Other route 2 (two) times daily. Use Onetouch verio test strips as instructed to check blood sugar twice daily. 100 each 2  . hydrALAZINE (APRESOLINE) 50 MG tablet Take 1 tablet (50 mg total) by mouth every 8 (eight) hours. 90 tablet 0  . hydrochlorothiazide (MICROZIDE) 12.5 MG capsule Take 1 capsule (12.5 mg total) by mouth daily. 90 capsule 1  . lansoprazole (PREVACID) 15 MG capsule Take 1 capsule (15 mg total) by mouth daily. 90 capsule 1  . loratadine (CLARITIN) 10 MG tablet Take 10 mg by mouth daily as needed for allergies.    . metoprolol succinate (TOPROL-XL) 100 MG 24 hr tablet Take 1 tablet (100 mg total) by mouth daily. Take with or immediately following a meal. 90 tablet 1  . Omega 3 1000 MG CAPS Take 2,000 mg by mouth daily.     . potassium chloride (KLOR-CON) 10 MEQ tablet Take 1 tablet (10 mEq total) by mouth 2 (two) times a week. (Patient taking differently: Take 10 mEq by mouth daily. 1 tablet (18mEq)  on Tuesday, Thursdays, Fridays, Saturday and Sunday.) 10 tablet 2  . sitaGLIPtin (JANUVIA) 100 MG tablet Take 1 tablet (100 mg total) by mouth daily. 90 tablet  4  . valsartan (DIOVAN) 320 MG tablet Take 1 tablet (320 mg total) by mouth daily. 90 tablet 3   Current Facility-Administered Medications  Medication Dose Route Frequency Provider Last Rate Last Admin  . triamcinolone acetonide (KENALOG) 10 MG/ML injection 10 mg  10 mg Other Once Harriet Masson, DPM        PHYSICAL EXAMINATION: ECOG PERFORMANCE STATUS: 2 - Symptomatic, <50% confined to bed  Vitals:   01/15/20 0816  BP: (!) 188/88  Pulse: 60  Resp: 18  Temp: 98.1 F (36.7 C)  SpO2: 99%   Filed Weights   01/15/20 0816  Weight: 132 lb 3.2 oz (60 kg)    GENERAL:alert, no distress and comfortable SKIN: skin color,  texture, turgor are normal, no rashes or significant lesions EYES: normal, Conjunctiva are pink and non-injected, sclera clear NECK: supple, thyroid normal size, non-tender, without nodularity LYMPH:  no palpable lymphadenopathy in the cervical, axillary  LUNGS: clear to auscultation and percussion with normal breathing effort HEART: regular rate & rhythm and no murmurs and no lower extremity edema ABDOMEN:abdomen soft, non-tender and normal bowel sounds Musculoskeletal:no cyanosis of digits and no clubbing, mild bilateral lower extremity edema above ankle. NEURO: alert & oriented x 3 with fluent speech  LABORATORY DATA:  I have reviewed the data as listed CBC Latest Ref Rng & Units 01/05/2020 01/04/2020 01/04/2020  WBC 4.0 - 10.5 K/uL 7.9 - 7.4  Hemoglobin 12.0 - 15.0 g/dL 9.1(L) 9.3(L) 7.2(L)  Hematocrit 36 - 46 % 28.0(L) 28.3(L) 22.6(L)  Platelets 150 - 400 K/uL 142(L) - 132(L)     CMP Latest Ref Rng & Units 01/05/2020 01/04/2020 01/03/2020  Glucose 70 - 99 mg/dL 101(H) 125(H) 90  BUN 8 - 23 mg/dL 49(H) 57(H) 61(H)  Creatinine 0.44 - 1.00 mg/dL 1.86(H) 2.02(H) 2.05(H)  Sodium 135 - 145 mmol/L 142 140 137  Potassium 3.5 - 5.1 mmol/L 3.8 3.9 3.9  Chloride 98 - 111 mmol/L 110 106 104  CO2 22 - 32 mmol/L $RemoveB'23 23 22  '[id number]$ Calcium 8.9 - 10.3 mg/dL 8.3(L) 8.2(L) 8.3(L)  Total Protein 6.5 - 8.1 g/dL - - 5.5(L)  Total Bilirubin 0.3 - 1.2 mg/dL - - 0.9  Alkaline Phos 38 - 126 U/L - - 61  AST 15 - 41 U/L - - 21  ALT 0 - 44 U/L - - 12      RADIOGRAPHIC STUDIES: I have personally reviewed the radiological images as listed and agreed with the findings in the report. No results found.   ASSESSMENT & PLAN:  BERLINE SEMRAD is a 79 y.o. female with    1.Intrahepatic cholangiocarcinoma, cT1N0M1,with peritoneal metastasis, MSS, IDH1 mutation (+) -She was diagnosed in 08/2018. Her biopsy of her liver mass shows adenocarcinoma,most consistent withcholangiocarcinoma.Her EGD/Colonoscopy from  7/10/20was negativeand 09/29/18 PET scanshowedno evidence of distant metastasis. -She was brought to OR on 11/17/18,unfortunately she was found to have peritoneal metastasis to right diaphragm and surgery was aborted. -Given her metastatic cancer, Istarted her onsystemicchemo withFirst line chemoOxaliplatinand gemcitabine q2weeksto control her diseasebeginningon 11/30/18.Due to poor tolerance to Oxaliplatin and Cisplatin from neuropathy, she proceeded withagent Gemcitabineevery 2 weeksstarting 06/28/19 with C16.Dose was reduced to $RemoveBe'600mg'CTXgvdhKz$ /m2 since 09/20/19 due to anemia -Her 12/18/19 PET showed No hypermetabolic activity to suggest active/recurrent malignancy. I discussed the prior metastatic lesions of her diaphragm are not visible on scans. I discussed it is possible she has reached near complete response.  -She was hospitalized again for CHF exacerbation, right after last dose of chemotherapy on December 27, 2019. -I again discussed the option of chemo break for 2 to 3 months, versus changing to oral Xeloda.  Giving her recent excellent response on PET scan, overall poor tolerance to chemo, I recommend her to consider chemo break.  After lengthy discussion, she agreed. -Follow-up every 3 to 4 weeks, plan to repeat CT of PET scan in early January or late Dec    2. Anemia,Secondary to chemoand cancer -Shepreviouslyrequiredblood transfusionsince 03/2019, continue as needed. Will continue prenatal vitamin. -Labs blood transfusion 01/08/20  3.Peripheral neuropathy, G3 -secondary to Oxaliplatinand Cisplatin (both d/c) -Due to low tolerance and not much help, she stopped Lyrica,Gabapentinand Cymbalta. -She waspreviouslyseen by Dr Mickeal Skinner.She is now being seen byChiropractor Dr Page Spiro. -She remains to have decreased function overall.Stablewith occasional flares.Stable.   4. H/o right breast cancer, Geneticsnegative, Osteopenia -s/prightmastectomy in 1992,  per patient did not require adjuvant therapy. She was last seen by Dr. Jana Hakim in 2017. -Her4/2019 DEXA shows mild osteopenia. F/u with Gyn about continuing Raloxifene. I do not think she has to continue at this point.  5.Comorbidities:DM,HTN,hiatal hernia, GERD, renal artery stenosis, recent pulmonary edema -Continue medications and f/u with PCP and cardiologist for management.  -Due to her uncontrolled hypertension, hydralazine was added, but it is difficult for her to take 3 times a day, she usually takes twice a day -She is on torsemide twice a week, and hydrochlorothiazide daily  6. Family support, Goal of Care Discussion -She has a son and daughterwho are often busy but help as needed. She notes they are aware of her condition. -The patient understands the goal of care is palliative.She is full code now  7.CKD stage III -Continue to monitor.Stable Cr at 1.2-15.   PLAN: -Lab after my visit today -Patient agreed with chemo break, until next scan in late December or early January -Lab fresh and follow-up in 3 weeks    No problem-specific Assessment & Plan notes found for this encounter.   No orders of the defined types were placed in this encounter.  All questions were answered. The patient knows to call the clinic with any problems, questions or concerns. No barriers to learning was detected. The total time spent in the appointment was 30 minutes.     [name], MD 01/15/2020   I, Joslyn Devon, am acting as scribe for [name], MD.   I have reviewed the above documentation for accuracy and completeness, and I agree with the above.

## 2020-01-15 ENCOUNTER — Inpatient Hospital Stay: Payer: Medicare Other

## 2020-01-15 ENCOUNTER — Inpatient Hospital Stay (HOSPITAL_BASED_OUTPATIENT_CLINIC_OR_DEPARTMENT_OTHER): Payer: Medicare Other | Admitting: Hematology

## 2020-01-15 ENCOUNTER — Other Ambulatory Visit: Payer: Self-pay

## 2020-01-15 ENCOUNTER — Inpatient Hospital Stay: Payer: Medicare Other | Attending: Adult Health

## 2020-01-15 ENCOUNTER — Encounter: Payer: Self-pay | Admitting: Hematology

## 2020-01-15 VITALS — BP 188/88 | HR 60 | Temp 98.1°F | Resp 18 | Ht 65.0 in | Wt 132.2 lb

## 2020-01-15 DIAGNOSIS — Z95828 Presence of other vascular implants and grafts: Secondary | ICD-10-CM

## 2020-01-15 DIAGNOSIS — I509 Heart failure, unspecified: Secondary | ICD-10-CM | POA: Diagnosis not present

## 2020-01-15 DIAGNOSIS — I2584 Coronary atherosclerosis due to calcified coronary lesion: Secondary | ICD-10-CM | POA: Diagnosis not present

## 2020-01-15 DIAGNOSIS — I130 Hypertensive heart and chronic kidney disease with heart failure and stage 1 through stage 4 chronic kidney disease, or unspecified chronic kidney disease: Secondary | ICD-10-CM | POA: Insufficient documentation

## 2020-01-15 DIAGNOSIS — I313 Pericardial effusion (noninflammatory): Secondary | ICD-10-CM | POA: Diagnosis not present

## 2020-01-15 DIAGNOSIS — I2510 Atherosclerotic heart disease of native coronary artery without angina pectoris: Secondary | ICD-10-CM | POA: Diagnosis not present

## 2020-01-15 DIAGNOSIS — I110 Hypertensive heart disease with heart failure: Secondary | ICD-10-CM | POA: Diagnosis not present

## 2020-01-15 DIAGNOSIS — M8580 Other specified disorders of bone density and structure, unspecified site: Secondary | ICD-10-CM | POA: Insufficient documentation

## 2020-01-15 DIAGNOSIS — C221 Intrahepatic bile duct carcinoma: Secondary | ICD-10-CM | POA: Insufficient documentation

## 2020-01-15 DIAGNOSIS — R0602 Shortness of breath: Secondary | ICD-10-CM | POA: Diagnosis not present

## 2020-01-15 DIAGNOSIS — N183 Chronic kidney disease, stage 3 unspecified: Secondary | ICD-10-CM | POA: Insufficient documentation

## 2020-01-15 DIAGNOSIS — I517 Cardiomegaly: Secondary | ICD-10-CM | POA: Diagnosis not present

## 2020-01-15 DIAGNOSIS — T451X5A Adverse effect of antineoplastic and immunosuppressive drugs, initial encounter: Secondary | ICD-10-CM | POA: Diagnosis not present

## 2020-01-15 DIAGNOSIS — I5032 Chronic diastolic (congestive) heart failure: Secondary | ICD-10-CM | POA: Insufficient documentation

## 2020-01-15 DIAGNOSIS — J9601 Acute respiratory failure with hypoxia: Secondary | ICD-10-CM | POA: Diagnosis not present

## 2020-01-15 DIAGNOSIS — G620 Drug-induced polyneuropathy: Secondary | ICD-10-CM

## 2020-01-15 DIAGNOSIS — J069 Acute upper respiratory infection, unspecified: Secondary | ICD-10-CM | POA: Diagnosis not present

## 2020-01-15 DIAGNOSIS — D649 Anemia, unspecified: Secondary | ICD-10-CM

## 2020-01-15 DIAGNOSIS — Z20822 Contact with and (suspected) exposure to covid-19: Secondary | ICD-10-CM | POA: Diagnosis not present

## 2020-01-15 DIAGNOSIS — J90 Pleural effusion, not elsewhere classified: Secondary | ICD-10-CM | POA: Diagnosis not present

## 2020-01-15 DIAGNOSIS — I5033 Acute on chronic diastolic (congestive) heart failure: Secondary | ICD-10-CM | POA: Diagnosis not present

## 2020-01-15 DIAGNOSIS — D6481 Anemia due to antineoplastic chemotherapy: Secondary | ICD-10-CM | POA: Insufficient documentation

## 2020-01-15 LAB — CMP (CANCER CENTER ONLY)
ALT: 6 U/L (ref 0–44)
AST: 23 U/L (ref 15–41)
Albumin: 2.7 g/dL — ABNORMAL LOW (ref 3.5–5.0)
Alkaline Phosphatase: 55 U/L (ref 38–126)
Anion gap: 5 (ref 5–15)
BUN: 27 mg/dL — ABNORMAL HIGH (ref 8–23)
CO2: 26 mmol/L (ref 22–32)
Calcium: 8.4 mg/dL — ABNORMAL LOW (ref 8.9–10.3)
Chloride: 111 mmol/L (ref 98–111)
Creatinine: 1.24 mg/dL — ABNORMAL HIGH (ref 0.44–1.00)
GFR, Estimated: 44 mL/min — ABNORMAL LOW (ref >60)
Glucose, Bld: 79 mg/dL (ref 70–99)
Potassium: 3.9 mmol/L (ref 3.5–5.1)
Sodium: 142 mmol/L (ref 135–145)
Total Bilirubin: 0.5 mg/dL (ref 0.3–1.2)
Total Protein: 6.3 g/dL — ABNORMAL LOW (ref 6.5–8.1)

## 2020-01-15 LAB — CBC WITH DIFFERENTIAL (CANCER CENTER ONLY)
Abs Immature Granulocytes: 0.04 10*3/uL (ref 0.00–0.07)
Basophils Absolute: 0.1 10*3/uL (ref 0.0–0.1)
Basophils Relative: 1 %
Eosinophils Absolute: 0.2 10*3/uL (ref 0.0–0.5)
Eosinophils Relative: 3 %
HCT: 28.9 % — ABNORMAL LOW (ref 36.0–46.0)
Hemoglobin: 9 g/dL — ABNORMAL LOW (ref 12.0–15.0)
Immature Granulocytes: 1 %
Lymphocytes Relative: 15 %
Lymphs Abs: 0.8 10*3/uL (ref 0.7–4.0)
MCH: 31.8 pg (ref 26.0–34.0)
MCHC: 31.1 g/dL (ref 30.0–36.0)
MCV: 102.1 fL — ABNORMAL HIGH (ref 80.0–100.0)
Monocytes Absolute: 0.6 10*3/uL (ref 0.1–1.0)
Monocytes Relative: 11 %
Neutro Abs: 3.9 10*3/uL (ref 1.7–7.7)
Neutrophils Relative %: 69 %
Platelet Count: 345 10*3/uL (ref 150–400)
RBC: 2.83 MIL/uL — ABNORMAL LOW (ref 3.87–5.11)
RDW: 18.5 % — ABNORMAL HIGH (ref 11.5–15.5)
WBC Count: 5.7 10*3/uL (ref 4.0–10.5)
nRBC: 0 % (ref 0.0–0.2)

## 2020-01-15 LAB — SAMPLE TO BLOOD BANK

## 2020-01-15 MED ORDER — HEPARIN SOD (PORK) LOCK FLUSH 100 UNIT/ML IV SOLN
500.0000 [IU] | Freq: Once | INTRAVENOUS | Status: AC
  Administered 2020-01-15: 500 [IU]
  Filled 2020-01-15: qty 5

## 2020-01-15 MED ORDER — SODIUM CHLORIDE 0.9% FLUSH
10.0000 mL | Freq: Once | INTRAVENOUS | Status: AC
  Administered 2020-01-15: 10 mL
  Filled 2020-01-15: qty 10

## 2020-01-15 NOTE — Patient Instructions (Signed)

## 2020-01-16 ENCOUNTER — Telehealth: Payer: Self-pay | Admitting: Hematology

## 2020-01-16 ENCOUNTER — Other Ambulatory Visit: Payer: Self-pay | Admitting: *Deleted

## 2020-01-16 NOTE — Telephone Encounter (Signed)
Scheduled per 11/1 los. Pt is aware of appt times and date.

## 2020-01-16 NOTE — Patient Outreach (Signed)
[facility]) Care Management  01/16/2020  Donna May [birth year] [phone]   Telephone outreach for HF follow.  Mrs. Bacorn reports she went to her oncologist yesterday and they are going to hold her regular chemotherapy for awhile as this may have contributed to her HF episodes X2. They will consider putting her on an oral chemotherapeutic agent.  She has received the [facility] Disease Management Calendar and has started to record her wts. Today's wt is 132.  We discussed the HF Action Plan which is outlined in her calendar. We reviewed her daily self management tasks and the HF Zones. Advised she needs to call her MD if she is in the yellow zone for advise. She agrees to these instructions.  She asked about the degree of her HF and we discussed her echocardiogram she had in October which revealed an EF of 60-65% and some mild mitral and tricuspid valve regurgitation. Explained that this condition is managed by her self and her MD's instructions with medication.  We agreed to talk again in 2 weeks.  [name]. [name], MSN, [facility] Gerontological Nurse Practitioner [facility] Care Management [phone]

## 2020-01-18 ENCOUNTER — Other Ambulatory Visit: Payer: Self-pay

## 2020-01-18 ENCOUNTER — Inpatient Hospital Stay (HOSPITAL_COMMUNITY)
Admission: EM | Admit: 2020-01-18 | Discharge: 2020-01-22 | DRG: 291 | Disposition: A | Discharge status: Home Health | Payer: Medicare Other | Attending: Internal Medicine | Admitting: Internal Medicine

## 2020-01-18 ENCOUNTER — Emergency Department (HOSPITAL_COMMUNITY): Payer: Medicare Other

## 2020-01-18 ENCOUNTER — Other Ambulatory Visit (HOSPITAL_COMMUNITY): Payer: Self-pay

## 2020-01-18 DIAGNOSIS — K219 Gastro-esophageal reflux disease without esophagitis: Secondary | ICD-10-CM | POA: Diagnosis present

## 2020-01-18 DIAGNOSIS — Z9011 Acquired absence of right breast and nipple: Secondary | ICD-10-CM

## 2020-01-18 DIAGNOSIS — Z853 Personal history of malignant neoplasm of breast: Secondary | ICD-10-CM | POA: Diagnosis not present

## 2020-01-18 DIAGNOSIS — C221 Intrahepatic bile duct carcinoma: Secondary | ICD-10-CM | POA: Diagnosis present

## 2020-01-18 DIAGNOSIS — Z7984 Long term (current) use of oral hypoglycemic drugs: Secondary | ICD-10-CM | POA: Diagnosis not present

## 2020-01-18 DIAGNOSIS — E7800 Pure hypercholesterolemia, unspecified: Secondary | ICD-10-CM | POA: Diagnosis present

## 2020-01-18 DIAGNOSIS — E785 Hyperlipidemia, unspecified: Secondary | ICD-10-CM | POA: Diagnosis present

## 2020-01-18 DIAGNOSIS — Z66 Do not resuscitate: Secondary | ICD-10-CM | POA: Diagnosis present

## 2020-01-18 DIAGNOSIS — N183 Chronic kidney disease, stage 3 unspecified: Secondary | ICD-10-CM | POA: Diagnosis present

## 2020-01-18 DIAGNOSIS — T451X5A Adverse effect of antineoplastic and immunosuppressive drugs, initial encounter: Secondary | ICD-10-CM | POA: Diagnosis present

## 2020-01-18 DIAGNOSIS — G620 Drug-induced polyneuropathy: Secondary | ICD-10-CM | POA: Diagnosis not present

## 2020-01-18 DIAGNOSIS — E1122 Type 2 diabetes mellitus with diabetic chronic kidney disease: Secondary | ICD-10-CM | POA: Diagnosis not present

## 2020-01-18 DIAGNOSIS — I509 Heart failure, unspecified: Secondary | ICD-10-CM | POA: Insufficient documentation

## 2020-01-18 DIAGNOSIS — Z79899 Other long term (current) drug therapy: Secondary | ICD-10-CM

## 2020-01-18 DIAGNOSIS — Z86010 Personal history of colonic polyps: Secondary | ICD-10-CM

## 2020-01-18 DIAGNOSIS — M1990 Unspecified osteoarthritis, unspecified site: Secondary | ICD-10-CM | POA: Diagnosis present

## 2020-01-18 DIAGNOSIS — R0602 Shortness of breath: Secondary | ICD-10-CM | POA: Diagnosis not present

## 2020-01-18 DIAGNOSIS — Z833 Family history of diabetes mellitus: Secondary | ICD-10-CM | POA: Diagnosis not present

## 2020-01-18 DIAGNOSIS — I313 Pericardial effusion (noninflammatory): Secondary | ICD-10-CM | POA: Diagnosis present

## 2020-01-18 DIAGNOSIS — I130 Hypertensive heart and chronic kidney disease with heart failure and stage 1 through stage 4 chronic kidney disease, or unspecified chronic kidney disease: Secondary | ICD-10-CM | POA: Diagnosis present

## 2020-01-18 DIAGNOSIS — I110 Hypertensive heart disease with heart failure: Secondary | ICD-10-CM | POA: Diagnosis not present

## 2020-01-18 DIAGNOSIS — I959 Hypotension, unspecified: Secondary | ICD-10-CM | POA: Diagnosis present

## 2020-01-18 DIAGNOSIS — Z8249 Family history of ischemic heart disease and other diseases of the circulatory system: Secondary | ICD-10-CM

## 2020-01-18 DIAGNOSIS — C786 Secondary malignant neoplasm of retroperitoneum and peritoneum: Secondary | ICD-10-CM | POA: Diagnosis present

## 2020-01-18 DIAGNOSIS — R0689 Other abnormalities of breathing: Secondary | ICD-10-CM | POA: Diagnosis not present

## 2020-01-18 DIAGNOSIS — I160 Hypertensive urgency: Secondary | ICD-10-CM | POA: Diagnosis present

## 2020-01-18 DIAGNOSIS — Z8701 Personal history of pneumonia (recurrent): Secondary | ICD-10-CM | POA: Diagnosis not present

## 2020-01-18 DIAGNOSIS — E1129 Type 2 diabetes mellitus with other diabetic kidney complication: Secondary | ICD-10-CM | POA: Diagnosis present

## 2020-01-18 DIAGNOSIS — N1832 Chronic kidney disease, stage 3b: Secondary | ICD-10-CM | POA: Diagnosis not present

## 2020-01-18 DIAGNOSIS — I5033 Acute on chronic diastolic (congestive) heart failure: Secondary | ICD-10-CM | POA: Diagnosis not present

## 2020-01-18 DIAGNOSIS — J069 Acute upper respiratory infection, unspecified: Secondary | ICD-10-CM | POA: Diagnosis not present

## 2020-01-18 DIAGNOSIS — J9601 Acute respiratory failure with hypoxia: Secondary | ICD-10-CM | POA: Diagnosis present

## 2020-01-18 DIAGNOSIS — J9600 Acute respiratory failure, unspecified whether with hypoxia or hypercapnia: Secondary | ICD-10-CM | POA: Diagnosis not present

## 2020-01-18 DIAGNOSIS — R0902 Hypoxemia: Secondary | ICD-10-CM | POA: Diagnosis not present

## 2020-01-18 DIAGNOSIS — Z20822 Contact with and (suspected) exposure to covid-19: Secondary | ICD-10-CM | POA: Diagnosis present

## 2020-01-18 DIAGNOSIS — I517 Cardiomegaly: Secondary | ICD-10-CM | POA: Diagnosis not present

## 2020-01-18 DIAGNOSIS — J90 Pleural effusion, not elsewhere classified: Secondary | ICD-10-CM | POA: Diagnosis not present

## 2020-01-18 DIAGNOSIS — J80 Acute respiratory distress syndrome: Secondary | ICD-10-CM | POA: Diagnosis not present

## 2020-01-18 DIAGNOSIS — Z87891 Personal history of nicotine dependence: Secondary | ICD-10-CM

## 2020-01-18 DIAGNOSIS — Z803 Family history of malignant neoplasm of breast: Secondary | ICD-10-CM

## 2020-01-18 DIAGNOSIS — I10 Essential (primary) hypertension: Secondary | ICD-10-CM | POA: Diagnosis not present

## 2020-01-18 DIAGNOSIS — Z7982 Long term (current) use of aspirin: Secondary | ICD-10-CM | POA: Diagnosis not present

## 2020-01-18 HISTORY — DX: Chronic diastolic (congestive) heart failure: I50.32

## 2020-01-18 LAB — CBC
HCT: 39.1 % (ref 36.0–46.0)
Hemoglobin: 12 g/dL (ref 12.0–15.0)
MCH: 31.9 pg (ref 26.0–34.0)
MCHC: 30.7 g/dL (ref 30.0–36.0)
MCV: 104 fL — ABNORMAL HIGH (ref 80.0–100.0)
Platelets: 450 10*3/uL — ABNORMAL HIGH (ref 150–400)
RBC: 3.76 MIL/uL — ABNORMAL LOW (ref 3.87–5.11)
RDW: 18.6 % — ABNORMAL HIGH (ref 11.5–15.5)
WBC: 9 10*3/uL (ref 4.0–10.5)
nRBC: 0 % (ref 0.0–0.2)

## 2020-01-18 LAB — COMPREHENSIVE METABOLIC PANEL
ALT: 13 U/L (ref 0–44)
AST: 33 U/L (ref 15–41)
Albumin: 3 g/dL — ABNORMAL LOW (ref 3.5–5.0)
Alkaline Phosphatase: 81 U/L (ref 38–126)
Anion gap: 11 (ref 5–15)
BUN: 26 mg/dL — ABNORMAL HIGH (ref 8–23)
CO2: 24 mmol/L (ref 22–32)
Calcium: 8.8 mg/dL — ABNORMAL LOW (ref 8.9–10.3)
Chloride: 106 mmol/L (ref 98–111)
Creatinine, Ser: 1.41 mg/dL — ABNORMAL HIGH (ref 0.44–1.00)
GFR, Estimated: 38 mL/min — ABNORMAL LOW (ref >60)
Glucose, Bld: 177 mg/dL — ABNORMAL HIGH (ref 70–99)
Potassium: 3.7 mmol/L (ref 3.5–5.1)
Sodium: 141 mmol/L (ref 135–145)
Total Bilirubin: 0.7 mg/dL (ref 0.3–1.2)
Total Protein: 7.7 g/dL (ref 6.5–8.1)

## 2020-01-18 LAB — BRAIN NATRIURETIC PEPTIDE: B Natriuretic Peptide: 1816.9 pg/mL — ABNORMAL HIGH (ref 0.0–100.0)

## 2020-01-18 LAB — I-STAT CHEM 8, ED
BUN: 26 mg/dL — ABNORMAL HIGH (ref 8–23)
Calcium, Ion: 1.2 mmol/L (ref 1.15–1.40)
Chloride: 110 mmol/L (ref 98–111)
Creatinine, Ser: 1.3 mg/dL — ABNORMAL HIGH (ref 0.44–1.00)
Glucose, Bld: 178 mg/dL — ABNORMAL HIGH (ref 70–99)
HCT: 37 % (ref 36.0–46.0)
Hemoglobin: 12.6 g/dL (ref 12.0–15.0)
Potassium: 3.7 mmol/L (ref 3.5–5.1)
Sodium: 146 mmol/L — ABNORMAL HIGH (ref 135–145)
TCO2: 24 mmol/L (ref 22–32)

## 2020-01-18 LAB — GLUCOSE, CAPILLARY
Glucose-Capillary: 70 mg/dL (ref 70–99)
Glucose-Capillary: 118 mg/dL — ABNORMAL HIGH (ref 70–99)

## 2020-01-18 LAB — RESPIRATORY PANEL BY RT PCR (FLU A&B, COVID)
Influenza A by PCR: NEGATIVE
Influenza B by PCR: NEGATIVE
SARS Coronavirus 2 by RT PCR: NEGATIVE

## 2020-01-18 LAB — CBG MONITORING, ED: Glucose-Capillary: 98 mg/dL (ref 70–99)

## 2020-01-18 LAB — TROPONIN I (HIGH SENSITIVITY)
Troponin I (High Sensitivity): 53 ng/L — ABNORMAL HIGH (ref <18)
Troponin I (High Sensitivity): 45 ng/L — ABNORMAL HIGH (ref <18)

## 2020-01-18 LAB — MAGNESIUM: Magnesium: 2.4 mg/dL (ref 1.7–2.4)

## 2020-01-18 MED ORDER — POTASSIUM CHLORIDE CRYS ER 10 MEQ PO TBCR
10.0000 meq | EXTENDED_RELEASE_TABLET | Freq: Every day | ORAL | Status: DC
  Administered 2020-01-18 – 2020-01-22 (×5): 10 meq via ORAL
  Filled 2020-01-18 (×5): qty 1

## 2020-01-18 MED ORDER — AMLODIPINE BESYLATE 10 MG PO TABS
10.0000 mg | ORAL_TABLET | Freq: Every day | ORAL | Status: DC
  Administered 2020-01-18 – 2020-01-22 (×5): 10 mg via ORAL
  Filled 2020-01-18 (×4): qty 1
  Filled 2020-01-18: qty 2

## 2020-01-18 MED ORDER — OMEGA-3-ACID ETHYL ESTERS 1 G PO CAPS
2.0000 g | ORAL_CAPSULE | Freq: Every day | ORAL | Status: DC
  Administered 2020-01-18 – 2020-01-22 (×5): 2 g via ORAL
  Filled 2020-01-18 (×5): qty 2

## 2020-01-18 MED ORDER — POTASSIUM CHLORIDE CRYS ER 20 MEQ PO TBCR: 20.0000 meq | EXTENDED_RELEASE_TABLET | Freq: Once | ORAL | Status: DC

## 2020-01-18 MED ORDER — HYDRALAZINE HCL 50 MG PO TABS
50.0000 mg | ORAL_TABLET | Freq: Three times a day (TID) | ORAL | Status: DC
  Administered 2020-01-18 – 2020-01-20 (×6): 50 mg via ORAL
  Filled 2020-01-18 (×4): qty 1
  Filled 2020-01-18: qty 2
  Filled 2020-01-18: qty 1

## 2020-01-18 MED ORDER — ENOXAPARIN SODIUM 40 MG/0.4ML ~~LOC~~ SOLN
40.0000 mg | SUBCUTANEOUS | Status: DC
  Administered 2020-01-18 – 2020-01-19 (×2): 40 mg via SUBCUTANEOUS
  Filled 2020-01-18 (×2): qty 0.4

## 2020-01-18 MED ORDER — ONDANSETRON HCL 4 MG/2ML IJ SOLN: 4.0000 mg | Freq: Four times a day (QID) | INTRAMUSCULAR | Status: DC | PRN

## 2020-01-18 MED ORDER — IRBESARTAN 150 MG PO TABS
300.0000 mg | ORAL_TABLET | Freq: Every day | ORAL | Status: DC
  Administered 2020-01-18 – 2020-01-22 (×5): 300 mg via ORAL
  Filled 2020-01-18: qty 2
  Filled 2020-01-18: qty 1
  Filled 2020-01-18 (×3): qty 2

## 2020-01-18 MED ORDER — NITROGLYCERIN 0.4 MG SL SUBL
0.4000 mg | SUBLINGUAL_TABLET | Freq: Once | SUBLINGUAL | Status: DC
  Administered 2020-01-18: 0.4 mg via SUBLINGUAL

## 2020-01-18 MED ORDER — INSULIN ASPART 100 UNIT/ML ~~LOC~~ SOLN: 0.0000 [IU] | Freq: Three times a day (TID) | SUBCUTANEOUS | Status: DC

## 2020-01-18 MED ORDER — SODIUM CHLORIDE 0.9% FLUSH: 3.0000 mL | INTRAVENOUS | Status: DC | PRN

## 2020-01-18 MED ORDER — SODIUM CHLORIDE 0.9 % IV SOLN: 250.0000 mL | INTRAVENOUS | Status: DC | PRN

## 2020-01-18 MED ORDER — ACETAMINOPHEN 325 MG PO TABS: 650.0000 mg | ORAL_TABLET | ORAL | Status: DC | PRN

## 2020-01-18 MED ORDER — METOPROLOL SUCCINATE ER 100 MG PO TB24
100.0000 mg | ORAL_TABLET | Freq: Every day | ORAL | Status: DC
  Administered 2020-01-19 – 2020-01-21 (×3): 100 mg via ORAL
  Filled 2020-01-18 (×3): qty 1

## 2020-01-18 MED ORDER — ASPIRIN EC 81 MG PO TBEC
81.0000 mg | DELAYED_RELEASE_TABLET | Freq: Every day | ORAL | Status: DC
  Administered 2020-01-18 – 2020-01-22 (×5): 81 mg via ORAL
  Filled 2020-01-18 (×5): qty 1

## 2020-01-18 MED ORDER — NITROGLYCERIN 0.4 MG SL SUBL
0.4000 mg | SUBLINGUAL_TABLET | SUBLINGUAL | Status: DC | PRN
  Filled 2020-01-18: qty 1

## 2020-01-18 MED ORDER — FUROSEMIDE 10 MG/ML IJ SOLN
40.0000 mg | Freq: Two times a day (BID) | INTRAMUSCULAR | Status: DC
  Administered 2020-01-18 – 2020-01-20 (×5): 40 mg via INTRAVENOUS
  Filled 2020-01-18 (×5): qty 4

## 2020-01-18 MED ORDER — SODIUM CHLORIDE 0.9% FLUSH
3.0000 mL | Freq: Two times a day (BID) | INTRAVENOUS | Status: DC
  Administered 2020-01-18: 3 mL via INTRAVENOUS
  Administered 2020-01-18 – 2020-01-19 (×2): 10 mL via INTRAVENOUS
  Administered 2020-01-19 – 2020-01-22 (×6): 3 mL via INTRAVENOUS

## 2020-01-18 MED ORDER — FUROSEMIDE 10 MG/ML IJ SOLN
60.0000 mg | Freq: Once | INTRAMUSCULAR | Status: AC
  Administered 2020-01-18: 60 mg via INTRAVENOUS
  Filled 2020-01-18: qty 6

## 2020-01-18 MED ORDER — FUROSEMIDE 10 MG/ML IJ SOLN
INTRAMUSCULAR | Status: AC
  Filled 2020-01-18: qty 4

## 2020-01-18 MED ORDER — PANTOPRAZOLE SODIUM 40 MG PO TBEC
40.0000 mg | DELAYED_RELEASE_TABLET | Freq: Every day | ORAL | Status: DC
  Administered 2020-01-18 – 2020-01-22 (×5): 40 mg via ORAL
  Filled 2020-01-18 (×5): qty 1

## 2020-01-18 MED ORDER — LORATADINE 10 MG PO TABS: 10.0000 mg | ORAL_TABLET | Freq: Every day | ORAL | Status: DC | PRN

## 2020-01-18 MED ORDER — OMEGA 3 1000 MG PO CAPS: 2000.0000 mg | ORAL_CAPSULE | Freq: Every day | ORAL | Status: DC

## 2020-01-18 NOTE — H&P (Signed)
History and Physical    LOVE CHOWNING BDZ:[id number] DOB: [birth year] DOA: 01/18/2020  PCP: [name], MD   Patient coming from: Home  I have personally briefly reviewed patient's old medical records in [place]  Chief Complaint: Shortness of breath  HPI: Donna May is a 79 y.o. female with medical history significant for chronic diastolic dysfunction CHF (Last known LVEF 60 - 65%), diabetes mellitus with complications of stage III chronic kidney disease, history of cholangiocarcinoma on chemotherapy, hypertension, history of prior right breast cancer who was brought into the emergency room by EMS for evaluation of sudden onset shortness of breath that started about 4 AM this morning.  EMS was called and when they arrived patient had room air pulse oximetry of 70%.  EMS attempted to place patient on a nonrebreather mask but she was unable to tolerate it, she was placed on 3 L of oxygen with improved pulse oximetry to 90%. Upon arrival to the ER she was noted to have blood pressure 230/150, respiratory rate in the 40s and was tachycardic with heart rate of 100 bpm. Patient was placed on noninvasive mechanical ventilation to reduce work of breathing. She denies having any chest pain,, no nausea, no vomiting, no diaphoresis, no abdominal pain, no changes in her bowel habits, no fever, no cough, no dizziness or lightheadedness. She has no urinary symptoms. Labs show sodium 146, potassium 3.7, chloride 110, bicarb 24, glucose 178, BUN 26, creatinine 1.30, calcium 8.8, alkaline phosphatase 81, albumin 3.0, AST 33, ALT 13, total protein 7.7, BNP 1816, white count 9.0, hemoglobin 12.6, hematocrit 37.0, MCV 104, RDW 18.6, platelet count 450, Respiratory viral panel is negative Chest x-ray reviewed by me shows cardiomegaly with diffuse bilateral pulmonary interstitial edema. Twelve-lead EKG reviewed by me shows sinus rhythm with left atrial enlargement    ED Course: Patient presents to  the ER for evaluation of sudden onset shortness of breath and was noted to have significantly elevated blood pressure. She was in respiratory distress and required non invasive mechanical ventilation to reduce her work of breathing. Patient received sublingual nitroglycerin with improvement of blood pressure to systolic of 341'D. She will be admitted to the hospital for further evaluation.   Review of Systems: As per HPI otherwise 10 point review of systems negative.    Past Medical History:  Diagnosis Date  . Anemia   . Anxiety   . Arthritis   . Breast cancer ([place]) 1992  . Cataract    Bilateral eyes - surgery to remove  . Chronic kidney disease    CKD stage 3  . Complication of anesthesia    "something they use make me itch for a couple of days."  . Depression   . Diabetes mellitus    type 2  . Duodenitis 01/18/2002  . Fainting spell   . Family history of breast cancer   . Family history of prostate cancer   . GERD (gastroesophageal reflux disease)   . Heart murmur    never has caused any problems  . Hiatal hernia 08/08/2008, 01/18/2002  . History of pneumonia    x 2  . Hyperlipidemia   . Hypertension   . Liver cancer ([place]) 08/2018  . Lymphedema 2017   Right arm  . Pneumonia    x 2  . UTI (lower urinary tract infection)     Past Surgical History:  Procedure Laterality Date  . AXILLARY SURGERY     cyst removal, right  . COLONOSCOPY  09/23/2018   Dr. Havery Moros - polyps  . EYE SURGERY Bilateral    cataracts to remove  . LAPAROSCOPY N/A 11/17/2018   Procedure: LAPAROSCOPY DIAGNOSTIC, INTRAOPERATIVE ULTRASOUND, PERITONEAL BIOPSIES;  Surgeon: [name], MD;  Location: [facility];  Service: General;  Laterality: N/A;  GENERAL AND EPIDURAL  . LIVER BIOPSY  08/2018   Dr. Lindwood Coke  . MASTECTOMY  1992   right, with flap  . NM MYOCAR PERF WALL MOTION  06/11/2009   Protocol:[place], post stress EF58%, EKG negative for ischemia, low risk  . PORTACATH PLACEMENT N/A 12/02/2018    Procedure: INSERTION PORT-A-CATH;  Surgeon: [name], MD;  Location: [facility];  Service: General;  Laterality: N/A;  . RECONSTRUCTION BREAST W/ TRAM FLAP Right   . TONSILLECTOMY    . TRANSTHORACIC ECHOCARDIOGRAM  12/24/2009   LVEF =>55%, normal study  . UPPER GI ENDOSCOPY       reports that she quit smoking about 31 years ago. Her smoking use included cigarettes. She has a 6.25 pack-year smoking history. She has never used smokeless tobacco. She reports previous alcohol use of about 1.0 standard drink of alcohol per week. She reports that she does not use drugs.  No Known Allergies  Family History  Problem Relation Age of Onset  . Breast cancer Cousin        diagnosed >50; mother's first cousins  . Diabetes Brother   . Heart disease Mother   . Hypertension Mother   . Heart disease Father   . Hypertension Father   . Prostate cancer Brother 39  . Heart attack Maternal Grandmother   . Stroke Maternal Grandfather   . Colon cancer Neg Hx   . Esophageal cancer Neg Hx   . Stomach cancer Neg Hx   . Rectal cancer Neg Hx      Prior to Admission medications   Medication Sig Start Date End Date Taking? Authorizing Provider  amLODipine (NORVASC) 10 MG tablet Take 1 tablet (10 mg total) by mouth daily. 10/22/19   Danford, Suann Larry, MD  aspirin 81 MG tablet Take 81 mg by mouth daily.     [provider]  furosemide (LASIX) 40 MG tablet Take 1 tablet (40 mg total) by mouth 2 (two) times a week. Patient taking differently: Take 40 mg by mouth See admin instructions. 1 tablet (40mg )  on Tuesday, Thursdays, Fridays, Saturday and Sunday. 11/23/19 11/22/20  [name], Mihai, MD  glucose blood test strip 1 each by Other route 2 (two) times daily. Use Onetouch verio test strips as instructed to check blood sugar twice daily. 01/05/19   Elayne Snare, MD  hydrALAZINE (APRESOLINE) 50 MG tablet Take 1 tablet (50 mg total) by mouth every 8 (eight) hours. 01/05/20 02/04/20  Darliss Cheney, MD    hydrochlorothiazide (MICROZIDE) 12.5 MG capsule Take 1 capsule (12.5 mg total) by mouth daily. 11/22/19 02/20/20  [name], Mihai, MD  lansoprazole (PREVACID) 15 MG capsule Take 1 capsule (15 mg total) by mouth daily. 06/08/19   [name], [name], MD  loratadine (CLARITIN) 10 MG tablet Take 10 mg by mouth daily as needed for allergies.    [provider]  metoprolol succinate (TOPROL-XL) 100 MG 24 hr tablet Take 1 tablet (100 mg total) by mouth daily. Take with or immediately following a meal. 10/22/19   Danford, Suann Larry, MD  Omega 3 1000 MG CAPS Take 2,000 mg by mouth daily.     [provider]  potassium chloride (KLOR-CON) 10 MEQ tablet Take 1 tablet (  10 mEq total) by mouth 2 (two) times a week. Patient taking differently: Take 10 mEq by mouth daily. 1 tablet (3mEq)  on Tuesday, Thursdays, Fridays, Saturday and Sunday. 11/23/19   [name], Mihai, MD  sitaGLIPtin (JANUVIA) 100 MG tablet Take 1 tablet (100 mg total) by mouth daily. 01/20/19   Elayne Snare, MD  valsartan (DIOVAN) 320 MG tablet Take 1 tablet (320 mg total) by mouth daily. 10/22/19   Edwin Dada, MD    Physical Exam: Vitals:   01/18/20 0645 01/18/20 0730 01/18/20 0745 01/18/20 0800  BP: (!) 178/76 (!) 173/77 (!) 176/72 (!) 164/71  Pulse: 73 70 65 64  Resp: 20 17 18 16   Temp:      TempSrc:      SpO2: 99% 100% 100% 100%  Weight:      Height:         Vitals:   01/18/20 0645 01/18/20 0730 01/18/20 0745 01/18/20 0800  BP: (!) 178/76 (!) 173/77 (!) 176/72 (!) 164/71  Pulse: 73 70 65 64  Resp: 20 17 18 16   Temp:      TempSrc:      SpO2: 99% 100% 100% 100%  Weight:      Height:        Constitutional: NAD, alert and oriented x 3 Eyes: PERRL, lids and conjunctivae normal ENMT: Mucous membranes are moist.  Neck: normal, supple, no masses, no thyromegaly Respiratory: crackles in both lungs, scattered wheezing.  Respiratory distress Cardiovascular: Tachycardic, no murmurs / rubs / gallops. No  extremity edema. 2+ pedal pulses. No carotid bruits. Left leg swelling Abdomen: no tenderness, no masses palpated. No hepatosplenomegaly. Bowel sounds positive.  Musculoskeletal: no clubbing / cyanosis. No joint deformity upper and lower extremities.  Skin: no rashes, lesions, ulcers.  Neurologic: No gross focal neurologic deficit. Psychiatric: Normal mood and affect.   Labs on Admission: I have personally reviewed following labs and imaging studies  CBC: Recent Labs  Lab 01/15/20 0913 01/18/20 0532 01/18/20 0549  WBC 5.7 9.0  --   NEUTROABS 3.9  --   --   HGB 9.0* 12.0 12.6  HCT 28.9* 39.1 37.0  MCV 102.1* 104.0*  --   PLT 345 450*  --    Basic Metabolic Panel: Recent Labs  Lab 01/15/20 0913 01/18/20 0532 01/18/20 0549  NA 142 141 146*  K 3.9 3.7 3.7  CL 111 106 110  CO2 26 24  --   GLUCOSE 79 177* 178*  BUN 27* 26* 26*  CREATININE 1.24* 1.41* 1.30*  CALCIUM 8.4* 8.8*  --   MG  --  2.4  --    GFR: Estimated Creatinine Clearance: 31.1 mL/min (A) (by C-G formula based on SCr of 1.3 mg/dL (H)). Liver Function Tests: Recent Labs  Lab 01/15/20 0913 01/18/20 0532  AST 23 33  ALT 6 13  ALKPHOS 55 81  BILITOT 0.5 0.7  PROT 6.3* 7.7  ALBUMIN 2.7* 3.0*   No results for input(s): LIPASE, AMYLASE in the last 168 hours. No results for input(s): AMMONIA in the last 168 hours. Coagulation Profile: No results for input(s): INR, PROTIME in the last 168 hours. Cardiac Enzymes: No results for input(s): CKTOTAL, CKMB, CKMBINDEX, TROPONINI in the last 168 hours. BNP (last 3 results) No results for input(s): PROBNP in the last 8760 hours. HbA1C: No results for input(s): HGBA1C in the last 72 hours. CBG: No results for input(s): GLUCAP in the last 168 hours. Lipid Profile: No results for input(s): CHOL, HDL, LDLCALC,  TRIG, CHOLHDL, LDLDIRECT in the last 72 hours. Thyroid Function Tests: No results for input(s): TSH, T4TOTAL, FREET4, T3FREE, THYROIDAB in the last 72  hours. Anemia Panel: No results for input(s): VITAMINB12, FOLATE, FERRITIN, TIBC, IRON, RETICCTPCT in the last 72 hours. Urine analysis:    Component Value Date/Time   COLORURINE YELLOW 01/01/2020 1733   APPEARANCEUR CLEAR 01/01/2020 1733   LABSPEC 1.014 01/01/2020 1733   PHURINE 5.0 01/01/2020 1733   GLUCOSEU NEGATIVE 01/01/2020 1733   HGBUR SMALL (A) 01/01/2020 1733   BILIRUBINUR NEGATIVE 01/01/2020 [place] 01/01/2020 1733   PROTEINUR NEGATIVE 01/01/2020 1733   NITRITE NEGATIVE 01/01/2020 1733   LEUKOCYTESUR NEGATIVE 01/01/2020 1733    Radiological Exams on Admission: DG Chest Portable 1 View  Result Date: 01/18/2020 CLINICAL DATA:  Shortness of breath. EXAM: PORTABLE CHEST 1 VIEW COMPARISON:  CT chest 01/01/2020.  Chest x-ray 12/29/2019. FINDINGS: PowerPort catheter stable position. Cardiomegaly. Diffuse bilateral pulmonary interstitial edema/infiltrates noted on today's exam. No pleural effusion. No pneumothorax. Surgical clips right chest. IMPRESSION: 1. PowerPort catheter in stable position. 2. Cardiomegaly. Diffuse bilateral pulmonary interstitial edema/infiltrates noted on today's exam. Electronically Signed   By: [place]   On: 01/18/2020 05:47    EKG: Independently reviewed.  Sinus rhythm  Assessment/Plan Principal Problem:   Acute on chronic diastolic CHF (congestive heart failure) (HCC) Active Problems:   Diabetes mellitus with renal complications (HCC)   GERD   Intrahepatic cholangiocarcinoma (HCC)   Chemotherapy-induced peripheral neuropathy (HCC)   CKD (chronic kidney disease), stage III (HCC)   Hypertensive urgency   Acute respiratory failure (HCC)    Acute on chronic diastolic dysfunction CHF Patient presents for evaluation of sudden onset shortness of breath and noted to have cardiomegaly and interstitial edema on her chest x-ray. BNP is also elevated We will start patient on Lasix 40 mg IV twice daily Optimize blood pressure  control Continue metoprolol, irbesartan and amlodipine Maintain low-sodium diet We will consult cardiology    Acute respiratory failure Secondary to acute on chronic diastolic dysfunction CHF Patient is currently on the BiPAP due to increased work of breathing We will attempt to wean off BiPAP as tolerated following improvement in patient's symptoms    Hypertensive urgency Optimize blood pressure control Continue metoprolol, amlodipine and ibesartan    Diabetes mellitus with complications of chronic kidney disease stage III Maintain consistent carbohydrate diet Sliding scale insulin for glycemic control    History of cholangiocarcinoma Follow-up with oncology as outpatient     DVT prophylaxis: Lovenox Code Status: DNR Family Communication: Greater than 50% of time was spent discussing patient's condition and plan of care with patient at the bedside.  All questions and concerns have been addressed.  She verbalizes understanding and agrees with the plan.  CODE STATUS was discussed and she is a DO NOT RESUSCITATE. Disposition Plan: Back to previous home environment Consults called: Cardiology    Akili Cuda MD [facility]     01/18/2020, 8:11 AM

## 2020-01-18 NOTE — Consult Note (Addendum)
Cardiology Consultation:   Patient ID: JATON EILERS; [id number]; 1940/10/12   Admit date: 01/18/2020 Date of Consult: 01/18/2020  Primary Care Provider: [name], MD Primary Cardiologist: Dr. Sanda Klein  Patient Profile:   Donna May is a 79 y.o. female with a hx of HTN, PAD (bilateral non-obstructive renal artery stenosis), HLD, DM2, breast cancer, remote tobacco use, cholangiocarcinoma with mets to the peritoneum, CKD Stage III and neuropathy who is being seen today for the evaluation of CHF at the request of Dr. Hal Hope.  History of Present Illness:   Donna May is an 79yo F with a hx as stated above who presented to [facility] on 01/18/20 with SOB that began approximately 4am on day of presentation. She states that over the last several days she has had increased exertional SOB and LE edema. She was recently seen in the ED 12/27/19 with similar presentation at which time her BNP was found to be 2301 with CXR showing pulmonary edema. She was having fevers at that time therefore ID was consulted with recommendations to remove her port-a-cath but this was deferred after fevers resolved. Echo showed no evidence of endocarditis. She was ultimately diuresed with IV Lasix and was discharged on Lasix 40mg  BID. She was also sent home on HCTZ 12.5 QD. BP was treated with amlodipine, hydralazine, metoprolol, HCTZ and valsartan.   After her symptoms began this morning, she called EMS given her recent hx and was found to be hypoxic on their presentation. She was placed on a non-rebreather with some improvement. In the ED, BP was markedly elevated at 230/150 and she was tachycardic with rates in the low 100's. She was placed on Bipap. She denies chest pain or other anginal symptoms. States that she does not take her BP or weigh herself at home. She reports that she eats takeout for almost every meal. She has had LE edema. She denies fevers, palpitations, dizziness or syncope.   BNP was  elevated at 1816. Creatinine at 1.30. Hb was stable at 12.6. CXR with cardiomegaly with diffuse bilateral pulmonary interstitial edema. She was given IV Lasix 40mg  and was restarted on PTA meds including metoprolol, amlodipine, irbesartan. Weight today is 130lb (was 123lb 11/22/2019 OV)  She is followed by Dr. Sallyanne Kuster for her cardiology care, last seen 11/22/2019. She was recently hospitalized 10/2019 for acute pulmonary edema. She was also treated for HTN urgency and anemia at which time her Hb was 6.3. CXR also showed multifocal PNA and she was treated with abx therapy. Echocardiogram was performed that showed an EF at 55-60% with moderate LVH and G2DD, no valvular disease. The myocardium was described as having a speckled appearance and amyloidosis was considered. On last follow up, recent CHF was felt to be secondary to flash pulmonary edema in the setting of significant anemia. Another consideration was that she had a painless cardiac event however, not felt to be a good candidate for aggressive surgical revascularization. Plan was to consider adding spironolactone if renal function allows. BP was relatively elevated therefore a thiazide diuretic was added and lasix was changed to twice weekly. Weight at that time was 123lb which appears to be at her baseline.   Prior to the above, she underwent a nuclear stress test 09/2016 which was noted to be low risk. Echo from 2014 with normal LV function, mild LVH, mild PAH at 16mmHg. No evidence of stenosis on LE arterial duplex in 2019.   Past Medical History:  Diagnosis Date  .  Anemia   . Anxiety   . Arthritis   . Breast cancer ([place]) 1992  . Cataract    Bilateral eyes - surgery to remove  . Chronic kidney disease    CKD stage 3  . Complication of anesthesia    "something they use make me itch for a couple of days."  . Depression   . Diabetes mellitus    type 2  . Duodenitis 01/18/2002  . Fainting spell   . Family history of breast cancer   . Family  history of prostate cancer   . GERD (gastroesophageal reflux disease)   . Heart murmur    never has caused any problems  . Hiatal hernia 08/08/2008, 01/18/2002  . History of pneumonia    x 2  . Hyperlipidemia   . Hypertension   . Liver cancer ([place]) 08/2018  . Lymphedema 2017   Right arm  . Pneumonia    x 2  . UTI (lower urinary tract infection)     Past Surgical History:  Procedure Laterality Date  . AXILLARY SURGERY     cyst removal, right  . COLONOSCOPY  09/23/2018   Dr. Havery Moros - polyps  . EYE SURGERY Bilateral    cataracts to remove  . LAPAROSCOPY N/A 11/17/2018   Procedure: LAPAROSCOPY DIAGNOSTIC, INTRAOPERATIVE ULTRASOUND, PERITONEAL BIOPSIES;  Surgeon: [name], MD;  Location: [facility];  Service: General;  Laterality: N/A;  GENERAL AND EPIDURAL  . LIVER BIOPSY  08/2018   Dr. Lindwood Coke  . MASTECTOMY  1992   right, with flap  . NM MYOCAR PERF WALL MOTION  06/11/2009   Protocol:[place], post stress EF58%, EKG negative for ischemia, low risk  . PORTACATH PLACEMENT N/A 12/02/2018   Procedure: INSERTION PORT-A-CATH;  Surgeon: [name], MD;  Location: [place];  Service: General;  Laterality: N/A;  . RECONSTRUCTION BREAST W/ TRAM FLAP Right   . TONSILLECTOMY    . TRANSTHORACIC ECHOCARDIOGRAM  12/24/2009   LVEF =>55%, normal study  . UPPER GI ENDOSCOPY       Prior to Admission medications   Medication Sig Start Date End Date Taking? Authorizing Provider  amLODipine (NORVASC) 10 MG tablet Take 1 tablet (10 mg total) by mouth daily. 10/22/19  Yes Danford, Suann Larry, MD  aspirin 81 MG tablet Take 81 mg by mouth daily.    Yes [provider]  furosemide (LASIX) 40 MG tablet Take 1 tablet (40 mg total) by mouth 2 (two) times a week. Patient taking differently: Take 40 mg by mouth See admin instructions. Take 1 tablet (40mg  totally) by mouth on Tuesday, Thursday, Saturday,  and Sunday. 11/23/19 11/22/20 Yes [name], Mihai, MD  hydrALAZINE (APRESOLINE) 50 MG tablet Take  1 tablet (50 mg total) by mouth every 8 (eight) hours. Patient taking differently: Take 50 mg by mouth in the morning and at bedtime.  01/05/20 02/04/20 Yes Pahwani, Einar Grad, MD  hydrochlorothiazide (MICROZIDE) 12.5 MG capsule Take 1 capsule (12.5 mg total) by mouth daily. 11/22/19 02/20/20 Yes [name], Mihai, MD  lansoprazole (PREVACID) 15 MG capsule Take 1 capsule (15 mg total) by mouth daily. 06/08/19  Yes [name], [name], MD  loratadine (CLARITIN) 10 MG tablet Take 10 mg by mouth daily as needed for allergies.   Yes [provider]  metoprolol succinate (TOPROL-XL) 100 MG 24 hr tablet Take 1 tablet (100 mg total) by mouth daily. Take with or immediately following a meal. Patient taking differently: Take 100 mg by mouth daily.  10/22/19  Yes Myrene Buddy  P, MD  Omega 3 1000 MG CAPS Take 2,000 mg by mouth daily.    Yes [provider]  potassium chloride (KLOR-CON) 10 MEQ tablet Take 1 tablet (10 mEq total) by mouth 2 (two) times a week. Patient taking differently: Take 10 mEq by mouth daily. Take 1 tablet (1mEq) by mouth on Tuesday,Thursdays, Saturday, and Sunday 11/23/19  Yes [name], Mihai, MD  sitaGLIPtin (JANUVIA) 100 MG tablet Take 1 tablet (100 mg total) by mouth daily. 01/20/19  Yes Elayne Snare, MD  valsartan (DIOVAN) 320 MG tablet Take 1 tablet (320 mg total) by mouth daily. 10/22/19  Yes Danford, Suann Larry, MD  glucose blood test strip 1 each by Other route 2 (two) times daily. Use Onetouch verio test strips as instructed to check blood sugar twice daily. 01/05/19   Elayne Snare, MD    Inpatient Medications: Scheduled Meds: . amLODipine  10 mg Oral Daily  . aspirin EC  81 mg Oral Daily  . enoxaparin (LOVENOX) injection  40 mg Subcutaneous Q24H  . furosemide  40 mg Intravenous BID  . hydrALAZINE  50 mg Oral Q8H  . insulin aspart  0-9 Units Subcutaneous TID WC  . irbesartan  300 mg Oral Daily  . metoprolol succinate  100 mg Oral Daily  . omega-3 acid ethyl  esters  2 g Oral Daily  . pantoprazole  40 mg Oral Daily  . potassium chloride  10 mEq Oral Daily  . sodium chloride flush  3 mL Intravenous Q12H  . triamcinolone acetonide  10 mg Other Once   Continuous Infusions: . sodium chloride     PRN Meds: sodium chloride, acetaminophen, loratadine, nitroGLYCERIN, ondansetron (ZOFRAN) IV, sodium chloride flush  Allergies:   No Known Allergies  Social History:   Social History   Socioeconomic History  . Marital status: Single    Spouse name: Not on file  . Number of children: 2  . Years of education: Not on file  . Highest education level: Not on file  Occupational History  . Occupation: retired  Tobacco Use  . Smoking status: Former Smoker    Packs/day: 0.25    Years: 25.00    Pack years: 6.25    Types: Cigarettes    Quit date: 1990    Years since quitting: 31.8  . Smokeless tobacco: Never Used  Vaping Use  . Vaping Use: Never used  Substance and Sexual Activity  . Alcohol use: Not Currently    Alcohol/week: 1.0 standard drink    Types: 1 Glasses of wine per week    Comment: occasional wine  . Drug use: No  . Sexual activity: Yes    Partners: Male    Birth control/protection: Condom, Post-menopausal    Comment: First sexual encounter age 21. Fewer than 5 partners in life time.  Other Topics Concern  . 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   Social Determinants of Health   Financial Resource Strain:   . Difficulty of Paying Living Expenses: Not on file  Food Insecurity: No Food Insecurity  . Worried About Charity fundraiser in the Last Year: Never true  . Ran Out of Food in the Last Year: Never true  Transportation Needs:   .  Lack of Transportation (Medical): Not on file  . Lack of  Transportation (Non-Medical): Not on file  Physical Activity:   . Days of Exercise per Week: Not on file  . Minutes of Exercise per Session: Not on file  Stress:   . Feeling of Stress : Not on file  Social Connections:   . Frequency of Communication with Friends and Family: Not on file  . Frequency of Social Gatherings with Friends and Family: Not on file  . Attends Religious Services: Not on file  . Active Member of Clubs or Organizations: Not on file  . Attends Archivist Meetings: Not on file  . Marital Status: Not on file  Intimate Partner Violence:   . Fear of Current or Ex-Partner: Not on file  . Emotionally Abused: Not on file  . Physically Abused: Not on file  . Sexually Abused: Not on file    Family History:   Family History  Problem Relation Age of Onset  . Breast cancer Cousin        diagnosed >50; mother's first cousins  . Diabetes Brother   . Heart disease Mother   . Hypertension Mother   . Heart disease Father   . Hypertension Father   . Prostate cancer Brother 93  . Heart attack Maternal Grandmother   . Stroke Maternal Grandfather   . Colon cancer Neg Hx   . Esophageal cancer Neg Hx   . Stomach cancer Neg Hx   . Rectal cancer Neg Hx    Family Status:  Family Status  Relation Name Status  . Cousin x2 (Not Specified)  . Brother  Deceased  . Mother  Deceased  . Father  Deceased  . Brother  Alive  . MGM  Deceased  . MGF  Deceased  . PGM  Deceased  . PGF  Deceased  . Mat Uncle  Deceased  . Ethlyn Daniels  Deceased  . Annamarie Major  Deceased  . Neg Hx  (Not Specified)    ROS:  Please see the history of present illness.  All other ROS reviewed and negative.     Physical Exam/Data:   Vitals:   01/18/20 1230 01/18/20 1245 01/18/20 1300 01/18/20 1315  BP: (!) 182/74 (!) 172/69 (!) 173/71 (!) 173/64  Pulse: 60 65 61 61  Resp: (!) 21 16 17 19   Temp:      TempSrc:      SpO2: 100% 98% 100% 100%  Weight:      Height:       No intake or output  data in the 24 hours ending 01/18/20 1327 Filed Weights   01/18/20 0538  Weight: 59 kg   Body mass index is 21.63 kg/m.   General: Elderly, NAD Neck: Negative for carotid bruits. + JVD Lungs: Bilateral upper and lower lobes rales. Breathing is unlabored. Cardiovascular: RRR with S1 S2. + murmurs Abdomen: Soft, non-tender, non-distended. No obvious abdominal masses. Extremities: 1-2+ BLE edema. Radial pulses 2+ bilaterally Neuro: Alert and oriented. No focal deficits. No facial asymmetry. MAE spontaneously. Psych: Responds to questions appropriately with normal affect.     EKG:  The EKG was personally reviewed and demonstrates: 01/18/20 NSR with early repolarization, HR 97bpm  Telemetry:  Telemetry was personally reviewed and demonstrates:  01/18/20 NSR with rates in the 60's   Relevant CV Studies:  Echocardiogram 01/03/20:   1. Left ventricular ejection fraction, by estimation, is 60 to 65%. The  left ventricle has normal function. The left ventricle has  no regional  wall motion abnormalities. There is moderate concentric left ventricular  hypertrophy. Left ventricular  diastolic parameters are consistent with Grade III diastolic dysfunction  (restrictive).   2. Right ventricular systolic function is normal. The right ventricular  size is normal. There is severely elevated pulmonary artery systolic  pressure.   3. Left atrial size was severely dilated.   4. Right atrial size was moderately dilated.   5. A small pericardial effusion is present. The pericardial effusion is  circumferential.   6. The mitral valve is normal in structure. Mild mitral valve  regurgitation.   7. Tricuspid valve regurgitation is moderate.   8. The aortic valve is tricuspid. Aortic valve regurgitation is not  visualized. No aortic stenosis is present.   9. The inferior vena cava is dilated in size with >50% respiratory  variability, suggesting right atrial pressure of 8 mmHg.   Echocardiogram  10/19/19:   1. Normal LV systolic function; moderate LVH; grade 2 diastolic  dysfunction; myocardium with speckled appearance; consider amyloid;  moderate LAE.   2. Left ventricular ejection fraction, by estimation, is 55 to 60%. The  left ventricle has normal function. The left ventricle has no regional  wall motion abnormalities. There is moderate left ventricular hypertrophy.  Left ventricular diastolic  parameters are consistent with Grade II diastolic dysfunction  (pseudonormalization). Elevated left atrial pressure.   3. Right ventricular systolic function is normal. The right ventricular  size is normal. There is moderately elevated pulmonary artery systolic  pressure.   4. Left atrial size was moderately dilated.   5. The mitral valve is normal in structure. Trivial mitral valve  regurgitation. No evidence of mitral stenosis.   6. The aortic valve is tricuspid. Aortic valve regurgitation is not  visualized. No aortic stenosis is present.   7. The inferior vena cava is normal in size with greater than 50%  respiratory variability, suggesting right atrial pressure of 3 mmHg.   Lexiscan stress test 10/01/2016:   The left ventricular ejection fraction is normal (55-65%).  Nuclear stress EF: 57%.  There was no ST segment deviation noted during stress.  No T wave inversion was noted during stress.  The study is normal.  This is a low risk study.    Laboratory Data:  Chemistry Recent Labs  Lab 01/15/20 0913 01/18/20 0532 01/18/20 0549  NA 142 141 146*  K 3.9 3.7 3.7  CL 111 106 110  CO2 26 24  --   GLUCOSE 79 177* 178*  BUN 27* 26* 26*  CREATININE 1.24* 1.41* 1.30*  CALCIUM 8.4* 8.8*  --   GFRNONAA 44* 38*  --   ANIONGAP 5 11  --     Total Protein  Date Value Ref Range Status  01/18/2020 7.7 6.5 - 8.1 g/dL Final  10/07/2017 6.6 6.0 - 8.5 g/dL Final  11/19/2015 7.2 6.4 - 8.3 g/dL Final   Albumin  Date Value Ref Range Status  01/18/2020 3.0 (L) 3.5 - 5.0  g/dL Final  10/07/2017 4.2 3.5 - 4.8 g/dL Final  11/19/2015 3.6 3.5 - 5.0 g/dL Final   AST  Date Value Ref Range Status  01/18/2020 33 15 - 41 U/L Final  01/15/2020 23 15 - 41 U/L Final  11/19/2015 20 5 - 34 U/L Final   ALT  Date Value Ref Range Status  01/18/2020 13 0 - 44 U/L Final  01/15/2020 6 0 - 44 U/L Final  11/19/2015 14 0 - 55 U/L Final   Alkaline  Phosphatase  Date Value Ref Range Status  01/18/2020 81 38 - 126 U/L Final  11/19/2015 59 40 - 150 U/L Final   Total Bilirubin  Date Value Ref Range Status  01/18/2020 0.7 0.3 - 1.2 mg/dL Final  01/15/2020 0.5 0.3 - 1.2 mg/dL Final  11/19/2015 0.41 0.20 - 1.20 mg/dL Final   Hematology Recent Labs  Lab 01/15/20 0913 01/18/20 0532 01/18/20 0549  WBC 5.7 9.0  --   RBC 2.83* 3.76*  --   HGB 9.0* 12.0 12.6  HCT 28.9* 39.1 37.0  MCV 102.1* 104.0*  --   MCH 31.8 31.9  --   MCHC 31.1 30.7  --   RDW 18.5* 18.6*  --   PLT 345 450*  --    Cardiac EnzymesNo results for input(s): TROPONINI in the last 168 hours. No results for input(s): TROPIPOC in the last 168 hours.  BNP Recent Labs  Lab 01/18/20 0532  BNP 1,816.9*    DDimer No results for input(s): DDIMER in the last 168 hours. TSH:  Lab Results  Component Value Date   TSH 1.48 04/26/2017   Lipids: Lab Results  Component Value Date   CHOL 190 09/01/2018   HDL 60.50 09/01/2018   LDLCALC 120 (H) 09/01/2018   TRIG 46.0 09/01/2018   CHOLHDL 3 09/01/2018   HgbA1c: Lab Results  Component Value Date   HGBA1C 4.9 12/28/2019    Radiology/Studies:  DG Chest Portable 1 View  Result Date: 01/18/2020 CLINICAL DATA:  Shortness of breath. EXAM: PORTABLE CHEST 1 VIEW COMPARISON:  CT chest 01/01/2020.  Chest x-ray 12/29/2019. FINDINGS: PowerPort catheter stable position. Cardiomegaly. Diffuse bilateral pulmonary interstitial edema/infiltrates noted on today's exam. No pleural effusion. No pneumothorax. Surgical clips right chest. IMPRESSION: 1. PowerPort catheter in  stable position. 2. Cardiomegaly. Diffuse bilateral pulmonary interstitial edema/infiltrates noted on today's exam. Electronically Signed   By: Lipscomb   On: 01/18/2020 05:47   Assessment and Plan:   1. Acute on chronic diastolic HF with acute respiratory failure: -Pt has had repeated ED visits and admissions over the last several months with similar presentation at which time she is treated with IV diuretics for CHF and antihypertensives are changed. She reports daily sodium indiscretions however is taking her medications as prescribed. She woke early this AM with SOB and has felt fluid volume overloaded for several days. She does not weight or take her BP at home. At last OV she was 123lb 11/2019>>>today she is 130lb. -BNP today is 1816 with CXR cardiomegaly with diffuse bilateral pulmonary interstitial edema. She was given IV Lasix 40mg   -BP was markedly elevated at 230/150 and she was tachycardic with rates in the low 100's. -She was hypoxic on EMS arrival with SpO2 in the 70's and was ultimately placed on Bipap with improvement>>>she is now on [place] with stable SpO2 levels  -Creatinine at 1.30 -Would recommend continuing IV Lasix 40mg  BID for now however may need to up titrate this tomorrow and follow response with daily weights and strict I&O's -Will likely require torsemide at discharge and needs sodium restriction education  -May benefit from dietitian consultation while inpatient   2. HTN emergency: -BP on EMS arrival was noted to be 230/150>>>this has responded to SL NTG as well as restarting home medications  -Recently seen and discharged 12/2019 with similar symptoms and was sent home on irbesartan charged on amlodipine, hydralazine, metoprolol HCTZ and valsartan.  -Restarted on amlodipine 10, hydralazine 50 Q8H, irbesartan 300, and Toprol 100 -BP subtly  improved with SBPs in the 160 currently  -Will attempt to simplify regimen as much as possible prior to discharge    3.  HLD: -Last LDL, 120 on 09/01/2018 -Has been off statin therapy since the start of her chemotherapy   4. Aortic atherosclerosis/PAD: -No specific complaints today  -Continue current regimen with ASA, beta blocker  -Echo with stable EF and low risk stress test 2019 -Denies anginal symptoms   5. Cholangiocarcinoma on non-cardiotoxic therapy (gemctitabine): -Not felt to be resectable with overall poor prognosis  -Per IM    For questions or updates, please contact [place] Please consult [email or web address] for contact info under Cardiology/STEMI.   Lyndel Safe NP-C [facility] Pager: [phone] 01/18/2020 1:27 PM  Personally seen and examined. Agree with APP above with the following comments: Briefly 57 F with history of HFpEF, HTN with multiple episodes of HTN emergency, Diabetes with HTN and PAD, CKD Stage IIIa,  Patient notes dry weight around 125 lbs.  Notes that she hasn't smoked in 30 years and has never had PFTs. Exam notable for elevated JVD and Crackles in auscultation Labs notable for BNP 1817 (better than prior admissions Personally reviewed relevant tests; ECG SR 97 with LAE, CXR notable for port and bilateral effusions Would recommend IV diuresis and slowly returning antihypertensives. Patient will likely need only daily or BID medications at DC given issues with TID medication adherence. Patient could be SGLTI candidate (Empa or dapa).  Werner Lean, MD

## 2020-01-18 NOTE — ED Notes (Signed)
Lunch Tray Ordered @ 1016. 

## 2020-01-18 NOTE — ED Notes (Signed)
RT at bedside and placed pt on BIPAP.

## 2020-01-18 NOTE — Progress Notes (Signed)
RT placed patient on BIPAP per MD order. Pt tolerating BIPAP settings well at this time. RT will monitor as needed.

## 2020-01-18 NOTE — ED Notes (Signed)
Paged MD about pt BP and her being unable to take her bp medication due to her being on bi-pap

## 2020-01-18 NOTE — ED Provider Notes (Signed)
[facility]PART[facility] Provider Note  CSN: [id number] Arrival date & time: 01/18/20 0522  Chief Complaint(s) Shortness of Breath  HPI Donna May is a 79 y.o. female with a past medical history listed below including prior breast cancer status post lumpectomy, also with known cholangiocarcinoma who was recently being treated with chemotherapy currently on a chemo break due to concern for chemo related cardiomyopathy given her recurrent exacerbations (noted on review of records).  She presents today for gradually worsening shortness of breath over the past several hours.  Patient is endorsing cough with frothy white sputum.  No recent fevers or infections.  No associated chest pain.  Patient has peripheral edema.  EMS was called out to the severity of her shortness of breath and found patient to be hypotensive with systolics in the 237S, hypoxic with sats in the 70s on room air.  Patient was placed on nasal cannula.  Remainder of history, ROS, and physical exam limited due to patient's condition (respiratory distress). Additional information was obtained from EMS.   Level V Caveat.    HPI  Past Medical History Past Medical History:  Diagnosis Date  . Anemia   . Anxiety   . Arthritis   . Breast cancer ([place]) 1992  . Cataract    Bilateral eyes - surgery to remove  . Chronic kidney disease    CKD stage 3  . Complication of anesthesia    "something they use make me itch for a couple of days."  . Depression   . Diabetes mellitus    type 2  . Duodenitis 01/18/2002  . Fainting spell   . Family history of breast cancer   . Family history of prostate cancer   . GERD (gastroesophageal reflux disease)   . Heart murmur    never has caused any problems  . Hiatal hernia 08/08/2008, 01/18/2002  . History of pneumonia    x 2  . Hyperlipidemia   . Hypertension   . Liver cancer ([place]) 08/2018  . Lymphedema 2017   Right arm  . Pneumonia    x 2  . UTI (lower  urinary tract infection)    Patient Active Problem List   Diagnosis Date Noted  . Acute on chronic congestive heart failure ([place])   . Fever   . Acute respiratory failure ([place]) 12/28/2019  . Hypertensive urgency 12/27/2019  . Acute on chronic diastolic CHF (congestive heart failure) ([place]) 12/27/2019  . Palliative care by specialist   . DNR (do not resuscitate) discussion   . Acute respiratory failure with hypoxia ([place]) 10/18/2019  . Hypertensive emergency 10/18/2019  . Anemia 10/18/2019  . CKD (chronic kidney disease), stage III ([place]) 10/18/2019  . Chemotherapy-induced peripheral neuropathy ([place]) 08/28/2019  . Port-A-Cath in place 12/14/2018  . Goals of care, counseling/discussion 11/24/2018  . Status post laparoscopy 11/17/2018  . Genetic testing 11/07/2018  . Family history of prostate cancer   . Family history of breast cancer   . Intrahepatic cholangiocarcinoma ([place]) 09/20/2018  . Coronary artery calcification 04/20/2016  . Ductal carcinoma in situ (DCIS) of right breast 11/26/2015  . Lymphedema of arm 11/26/2015  . Lymphedema 11/01/2015  . Leg pain, bilateral 12/28/2013  . Special screening for malignant neoplasms, colon 08/09/2013  . Renal artery stenosis, native, bilateral ([place]) 06/09/2013  . Diabetes mellitus with renal complications ([place]) 28/31/5176  . RASH AND OTHER NONSPECIFIC SKIN ERUPTION 07/05/2008  . Malignant neoplasm of female breast ([place]) 07/04/2008  . HYPERCHOLESTEROLEMIA 07/04/2008  .  Essential hypertension 07/04/2008  . GERD 07/04/2008  . DUODENITIS, WITHOUT HEMORRHAGE 07/04/2008  . HIATAL HERNIA 07/04/2008  . Arthropathy 07/04/2008   Home Medication(s) Prior to Admission medications   Medication Sig Start Date End Date Taking? Authorizing Provider  amLODipine (NORVASC) 10 MG tablet Take 1 tablet (10 mg total) by mouth daily. 10/22/19   Danford, Suann Larry, MD  aspirin 81 MG tablet Take 81 mg by mouth daily.     [provider]  furosemide  (LASIX) 40 MG tablet Take 1 tablet (40 mg total) by mouth 2 (two) times a week. Patient taking differently: Take 40 mg by mouth See admin instructions. 1 tablet (40mg )  on Tuesday, Thursdays, Fridays, Saturday and Sunday. 11/23/19 11/22/20  [name], Mihai, MD  glucose blood test strip 1 each by Other route 2 (two) times daily. Use Onetouch verio test strips as instructed to check blood sugar twice daily. 01/05/19   Elayne Snare, MD  hydrALAZINE (APRESOLINE) 50 MG tablet Take 1 tablet (50 mg total) by mouth every 8 (eight) hours. 01/05/20 02/04/20  Darliss Cheney, MD  hydrochlorothiazide (MICROZIDE) 12.5 MG capsule Take 1 capsule (12.5 mg total) by mouth daily. 11/22/19 02/20/20  [name], Mihai, MD  lansoprazole (PREVACID) 15 MG capsule Take 1 capsule (15 mg total) by mouth daily. 06/08/19   [name], [name], MD  loratadine (CLARITIN) 10 MG tablet Take 10 mg by mouth daily as needed for allergies.    [provider]  metoprolol succinate (TOPROL-XL) 100 MG 24 hr tablet Take 1 tablet (100 mg total) by mouth daily. Take with or immediately following a meal. 10/22/19   Danford, Suann Larry, MD  Omega 3 1000 MG CAPS Take 2,000 mg by mouth daily.     [provider]  potassium chloride (KLOR-CON) 10 MEQ tablet Take 1 tablet (10 mEq total) by mouth 2 (two) times a week. Patient taking differently: Take 10 mEq by mouth daily. 1 tablet (22mEq)  on Tuesday, Thursdays, Fridays, Saturday and Sunday. 11/23/19   [name], Mihai, MD  sitaGLIPtin (JANUVIA) 100 MG tablet Take 1 tablet (100 mg total) by mouth daily. 01/20/19   Elayne Snare, MD  valsartan (DIOVAN) 320 MG tablet Take 1 tablet (320 mg total) by mouth daily. 10/22/19   Danford, Suann Larry, MD                                                                                                                                    Past Surgical History Past Surgical History:  Procedure Laterality Date  . AXILLARY SURGERY     cyst removal, right  .  COLONOSCOPY  09/23/2018   Dr. Havery Moros - polyps  . EYE SURGERY Bilateral    cataracts to remove  . LAPAROSCOPY N/A 11/17/2018   Procedure: LAPAROSCOPY DIAGNOSTIC, INTRAOPERATIVE ULTRASOUND, PERITONEAL BIOPSIES;  Surgeon: [name], MD;  Location: [facility];  Service: General;  Laterality: N/A;  GENERAL AND EPIDURAL  .  LIVER BIOPSY  08/2018   Dr. Lindwood Coke  . MASTECTOMY  1992   right, with flap  . NM MYOCAR PERF WALL MOTION  06/11/2009   Protocol:[place], post stress EF58%, EKG negative for ischemia, low risk  . PORTACATH PLACEMENT N/A 12/02/2018   Procedure: INSERTION PORT-A-CATH;  Surgeon: [name], MD;  Location: [facility];  Service: General;  Laterality: N/A;  . RECONSTRUCTION BREAST W/ TRAM FLAP Right   . TONSILLECTOMY    . TRANSTHORACIC ECHOCARDIOGRAM  12/24/2009   LVEF =>55%, normal study  . UPPER GI ENDOSCOPY     Family History Family History  Problem Relation Age of Onset  . Breast cancer Cousin        diagnosed >50; mother's first cousins  . Diabetes Brother   . Heart disease Mother   . Hypertension Mother   . Heart disease Father   . Hypertension Father   . Prostate cancer Brother 31  . Heart attack Maternal Grandmother   . Stroke Maternal Grandfather   . Colon cancer Neg Hx   . Esophageal cancer Neg Hx   . Stomach cancer Neg Hx   . Rectal cancer Neg Hx     Social History Social History   Tobacco Use  . Smoking status: Former Smoker    Packs/day: 0.25    Years: 25.00    Pack years: 6.25    Types: Cigarettes    Quit date: 1990    Years since quitting: 31.8  . Smokeless tobacco: Never Used  Vaping Use  . Vaping Use: Never used  Substance Use Topics  . Alcohol use: Not Currently    Alcohol/week: 1.0 standard drink    Types: 1 Glasses of wine per week    Comment: occasional wine  . Drug use: No   Allergies Patient has no known allergies.  Review of Systems Review of Systems All other systems are reviewed and are negative for acute change except as  noted in the HPI  Physical Exam Vital Signs  I have reviewed the triage vital signs BP (!) 207/97   Pulse 85   Temp (!) 96.2 F (35.7 C) (Temporal)   Resp (!) 26   Ht 5\' 5"  (1.651 m)   Wt 59 kg   LMP  (LMP Unknown)   SpO2 100%   BMI 21.63 kg/m   Physical Exam Vitals reviewed.  Constitutional:      General: She is not in acute distress.    Appearance: She is well-developed. She is not diaphoretic.  HENT:     Head: Normocephalic and atraumatic.     Nose: Nose normal.  Eyes:     General: No scleral icterus.       Right eye: No discharge.        Left eye: No discharge.     Conjunctiva/sclera: Conjunctivae normal.     Pupils: Pupils are equal, round, and reactive to light.  Cardiovascular:     Rate and Rhythm: Normal rate and regular rhythm.     Heart sounds: No murmur heard.  No friction rub. No gallop.   Pulmonary:     Effort: Tachypnea, accessory muscle usage, respiratory distress and retractions present.     Breath sounds: No stridor. Examination of the right-middle field reveals rales. Examination of the left-middle field reveals rales. Examination of the right-lower field reveals rales. Examination of the left-lower field reveals rales. Rales present.  Chest:    Abdominal:     General: There is no distension.  Palpations: Abdomen is soft.     Tenderness: There is no abdominal tenderness.  Musculoskeletal:        General: No tenderness.     Cervical back: Normal range of motion and neck supple.     Right lower leg: 1+ Pitting Edema present.     Left lower leg: 1+ Pitting Edema present.  Skin:    General: Skin is warm and dry.     Findings: No erythema or rash.  Neurological:     Mental Status: She is alert and oriented to person, place, and time.     ED Results and Treatments Labs (all labs ordered are listed, but only abnormal results are displayed) Labs Reviewed  BRAIN NATRIURETIC PEPTIDE - Abnormal; Notable for the following components:      Result  Value   B Natriuretic Peptide 1,816.9 (*)    All other components within normal limits  CBC - Abnormal; Notable for the following components:   RBC 3.76 (*)    MCV 104.0 (*)    RDW 18.6 (*)    Platelets 450 (*)    All other components within normal limits  COMPREHENSIVE METABOLIC PANEL - Abnormal; Notable for the following components:   Glucose, Bld 177 (*)    BUN 26 (*)    Creatinine, Ser 1.41 (*)    Calcium 8.8 (*)    Albumin 3.0 (*)    GFR, Estimated 38 (*)    All other components within normal limits  I-STAT CHEM 8, ED - Abnormal; Notable for the following components:   Sodium 146 (*)    BUN 26 (*)    Creatinine, Ser 1.30 (*)    Glucose, Bld 178 (*)    All other components within normal limits  RESPIRATORY PANEL BY RT PCR (FLU A&B, COVID)  MAGNESIUM                                                                                                                         EKG    Radiology DG Chest Portable 1 View  Result Date: 01/18/2020 CLINICAL DATA:  Shortness of breath. EXAM: PORTABLE CHEST 1 VIEW COMPARISON:  CT chest 01/01/2020.  Chest x-ray 12/29/2019. FINDINGS: PowerPort catheter stable position. Cardiomegaly. Diffuse bilateral pulmonary interstitial edema/infiltrates noted on today's exam. No pleural effusion. No pneumothorax. Surgical clips right chest. IMPRESSION: 1. PowerPort catheter in stable position. 2. Cardiomegaly. Diffuse bilateral pulmonary interstitial edema/infiltrates noted on today's exam. Electronically Signed   By: Marcello Moores  Register   On: 01/18/2020 05:47    Pertinent labs & imaging results that were available during my care of the patient were reviewed by me and considered in my medical decision making (see chart for details).  Medications Ordered in ED Medications  nitroGLYCERIN (NITROSTAT) SL tablet 0.4 mg (has no administration in time range)  furosemide (LASIX) injection 60 mg (has no administration in time range)  Procedures .1-3 Lead EKG Interpretation Performed by: Fatima Blank, MD Authorized by: Fatima Blank, MD     Interpretation: normal     ECG rate:  82   ECG rate assessment: normal     Rhythm: sinus rhythm     Ectopy: none     Conduction: normal   .Critical Care Performed by: Fatima Blank, MD Authorized by: Fatima Blank, MD   Critical care provider statement:    Critical care time (minutes):  45   Critical care was necessary to treat or prevent imminent or life-threatening deterioration of the following conditions:  Cardiac failure and respiratory failure   Critical care was time spent personally by me on the following activities:  Discussions with consultants, evaluation of patient's response to treatment, examination of patient, ordering and performing treatments and interventions, ordering and review of laboratory studies, ordering and review of radiographic studies, pulse oximetry, re-evaluation of patient's condition, obtaining history from patient or surrogate and review of old charts    (including critical care time)  Medical Decision Making / ED Course I have reviewed the nursing notes for this encounter and the patient's prior records (if available in EHR or on provided paperwork).   SHAINDY READER was evaluated in Emergency Department on 01/18/2020 for the symptoms described in the history of present illness. She was evaluated in the context of the global COVID-19 pandemic, which necessitated consideration that the patient might be at risk for infection with the SARS-CoV-2 virus that causes COVID-19. Institutional protocols and algorithms that pertain to the evaluation of patients at risk for COVID-19 are in a state of rapid change based on information released by regulatory bodies including the CDC and federal and state organizations. These  policies and algorithms were followed during the patient's care in the ED.  Patient presents in respiratory distress with evidence of volume overload on exam and on lung auscultation. She is severely hypertensive. Currently on 3 L nasal cannula from EMS.  EKG did not reveal any acute ischemic changes or evidence of pericarditis.  On review of records, patient's last echocardiogram on 20 October revealed diastolic heart failure.  Small pericardial effusion noted.  Patient was given sublingual nitroglycerin and placed on BiPAP. Blood pressures improved down to systolics in the 364W. Patient's work of breathing also improved.  Chest x-ray confirmed pulmonary edema consistent with CHF exacerbation.  CBC without leukocytosis or anemia.  No significant electrolyte derangements.  Patient does have mild renal sufficiency, stable compared to prior.  Respiratory panel pending.     Final Clinical Impression(s) / ED Diagnoses Final diagnoses:  Acute respiratory failure with hypoxia ([place])  Acute on chronic diastolic congestive heart failure ([place])      This chart was dictated using voice recognition software.  Despite best efforts to proofread,  errors can occur which can change the documentation meaning.   Fatima Blank, MD 01/18/20 [phone]

## 2020-01-18 NOTE — ED Triage Notes (Signed)
Pt arrives to ED BIB [facility] due to [facility]. Per EMS pt began to feel short of breath around 4am this morning and when EMS arrived pt was sating at 70% RA. Per EMS they attempted to place pt on a NRB but pt could not tolerate it. EMS placed pt on 3L [place] bringing sats up to 90%  Hx of COPD, CHF, HTN. Pt A/O x4.  EDP at bedside upon pt's arrival.  BP 230/150 HR 100 R 40

## 2020-01-19 DIAGNOSIS — J9600 Acute respiratory failure, unspecified whether with hypoxia or hypercapnia: Secondary | ICD-10-CM

## 2020-01-19 DIAGNOSIS — N1832 Chronic kidney disease, stage 3b: Secondary | ICD-10-CM | POA: Diagnosis not present

## 2020-01-19 DIAGNOSIS — I5033 Acute on chronic diastolic (congestive) heart failure: Secondary | ICD-10-CM | POA: Diagnosis not present

## 2020-01-19 DIAGNOSIS — E1122 Type 2 diabetes mellitus with diabetic chronic kidney disease: Secondary | ICD-10-CM | POA: Diagnosis not present

## 2020-01-19 LAB — BASIC METABOLIC PANEL
Anion gap: 9 (ref 5–15)
BUN: 29 mg/dL — ABNORMAL HIGH (ref 8–23)
CO2: 24 mmol/L (ref 22–32)
Calcium: 8.2 mg/dL — ABNORMAL LOW (ref 8.9–10.3)
Chloride: 108 mmol/L (ref 98–111)
Creatinine, Ser: 1.46 mg/dL — ABNORMAL HIGH (ref 0.44–1.00)
GFR, Estimated: 36 mL/min — ABNORMAL LOW (ref >60)
Glucose, Bld: 96 mg/dL (ref 70–99)
Potassium: 3.8 mmol/L (ref 3.5–5.1)
Sodium: 141 mmol/L (ref 135–145)

## 2020-01-19 LAB — GLUCOSE, CAPILLARY
Glucose-Capillary: 101 mg/dL — ABNORMAL HIGH (ref 70–99)
Glucose-Capillary: 87 mg/dL (ref 70–99)
Glucose-Capillary: 98 mg/dL (ref 70–99)
Glucose-Capillary: 90 mg/dL (ref 70–99)

## 2020-01-19 MED ORDER — ENOXAPARIN SODIUM 30 MG/0.3ML ~~LOC~~ SOLN
30.0000 mg | SUBCUTANEOUS | Status: DC
  Administered 2020-01-20 – 2020-01-22 (×3): 30 mg via SUBCUTANEOUS
  Filled 2020-01-19 (×3): qty 0.3

## 2020-01-19 NOTE — Plan of Care (Signed)
  Problem: Education: Goal: Ability to verbalize understanding of medication therapies will improve Outcome: Progressing   

## 2020-01-19 NOTE — Consult Note (Signed)
   [facility] Inpatient Consult   01/19/2020  GARRETT BOWRING [birth year] [id number]  Patient is currently active with [facility] for chronic disease management services - Active with [facility].  Plan: Will update [facility] of ongoing follow up needs. Continue to follow for progress and disposition needs.  Of note, [facility] Care Management services does not replace or interfere with any services that are needed or arranged by inpatient [facility] care management team.   Netta Cedars, MSN, RN [facility] Liaison Nurse Mobile Phone [phone]  Toll free office [phone]

## 2020-01-19 NOTE — Progress Notes (Signed)
PROGRESS NOTE    Donna May  GYJ:[id number] DOB: [birth year] DOA: 01/18/2020 PCP: [name], MD   Brief Narrative:  Donna May is a 79 y.o. female with medical history significant for chronic diastolic dysfunction CHF (Last known LVEF 60 - 65%), diabetes mellitus with complications of stage III chronic kidney disease, history of cholangiocarcinoma on chemotherapy, hypertension, history of prior right breast cancer who was brought into the emergency room by EMS for evaluation of sudden onset shortness of breath that started about 4 AM this morning.  EMS was called and when they arrived patient had room air pulse oximetry of 70%.  EMS attempted to place patient on a nonrebreather mask but she was unable to tolerate it, she was placed on 3 L of oxygen with improved pulse oximetry to 90%. Upon arrival to the ER she was noted to have blood pressure 230/150, respiratory rate in the 40s and was tachycardic with heart rate of 100 bpm. Patient was placed on noninvasive mechanical ventilation to reduce work of breathing. She denies having any chest pain,, no nausea, no vomiting, no diaphoresis, no abdominal pain, no changes in her bowel habits, no fever, no cough, no dizziness or lightheadedness. She has no urinary symptoms. Labs show sodium 146, potassium 3.7, chloride 110, bicarb 24, glucose 178, BUN 26, creatinine 1.30, calcium 8.8, alkaline phosphatase 81, albumin 3.0, AST 33, ALT 13, total protein 7.7, BNP 1816, white count 9.0, hemoglobin 12.6, hematocrit 37.0, MCV 104, RDW 18.6, platelet count 450, Respiratory viral panel is negative. Chest x-ray shows cardiomegaly with diffuse bilateral pulmonary interstitial edema. Twelve-lead EKG shows sinus rhythm with left atrial enlargement   Assessment & Plan:  Acute hypoxic respiratory failure, multifactorial, POA  Most notably in the setting of heart failure exacerbation and hypertensive emergency  Continue diuresis and supportive care as below    Hypertensive emergency Blood pressure markedly uncontrolled at intake, resume home medications Continue to titrate appropriately, blood pressure today much more appropriate on home medications alone concerning for medication noncompliance Lengthy discussion at bedside about need for close monitoring of blood pressure at home  Acute on chronic diastolic dysfunction CHF Acute on chronic diastolic dysfunction heart failure exacerbation in the setting of hypertensive emergency as above BNP moderately elevated in the setting of obesity concerning for profound heart failure exacerbation Continue Lasix IV twice daily Blood pressure markedly well controlled now as above on home medications Cardiology following, appreciate insight recommendations Recurrent testing imaging and further diuretic management assistance with cardiology will be appreciated Continue metoprolol, irbesartan and amlodipine  Diabetes mellitus with complications of chronic kidney disease stage IIIB Maintain consistent carbohydrate diet Continue sliding scale insulin, hypoglycemic protocol  Lab Results  Component Value Date   HGBA1C 4.9 12/28/2019   History of cholangiocarcinoma Follow-up with oncology as outpatient; no acute indication for treatment imaging or intervention at this time  DVT prophylaxis: Lovenox Code Status:  DNI -patient has living will with family, she indicates this states she will have "everything done except intubation" -attempting to gain copy from family for documentation Family Communication: None present  Status is: Inpatient  Dispo: The patient is from: Home              Anticipated d/c is to: To be determined              Anticipated d/c date is: Likely 48 to 72 hours pending clinical course              Patient currently  not medically stable for discharge given ongoing need for IV diuretics close monitoring and supportive care in the inpatient setting.  Consultants:    Cardiology  Procedures:   None  Antimicrobials:  None  Subjective: No acute issues or events overnight denies nausea, vomiting, diarrhea, constipation, headache, fevers, chills.  Objective: Vitals:   01/18/20 2140 01/19/20 0055 01/19/20 0535 01/19/20 0640  BP: (!) 161/63 (!) 135/50 (!) 174/69 (!) 171/67  Pulse:  66 63   Resp:  17 20   Temp:  98.2 F (36.8 C) 98 F (36.7 C)   TempSrc:  Oral Oral   SpO2:  100% 100%   Weight:   58.2 kg   Height:        Intake/Output Summary (Last 24 hours) at 01/19/2020 0739 Last data filed at 01/19/2020 0549 Gross per 24 hour  Intake 967 ml  Output 950 ml  Net 17 ml   Filed Weights   01/18/20 0538 01/19/20 0535  Weight: 59 kg 58.2 kg    Examination:  General exam: Appears calm and comfortable  Respiratory system: Clear to auscultation. Respiratory effort normal. Cardiovascular system: S1 & S2 heard, RRR. No JVD, murmurs, rubs, gallops or clicks. No pedal edema. Gastrointestinal system: Abdomen is nondistended, soft and nontender. No organomegaly or masses felt. Normal bowel sounds heard. Central nervous system: Alert and oriented. No focal neurological deficits. Extremities: Symmetric 5 x 5 power. Skin: No rashes, lesions or ulcers Psychiatry: Judgement and insight appear normal. Mood & affect appropriate.     Data Reviewed: I have personally reviewed following labs and imaging studies  CBC: Recent Labs  Lab 01/15/20 0913 01/18/20 0532 01/18/20 0549  WBC 5.7 9.0  --   NEUTROABS 3.9  --   --   HGB 9.0* 12.0 12.6  HCT 28.9* 39.1 37.0  MCV 102.1* 104.0*  --   PLT 345 450*  --    Basic Metabolic Panel: Recent Labs  Lab 01/15/20 0913 01/18/20 0532 01/18/20 0549 01/19/20 0119  NA 142 141 146* 141  K 3.9 3.7 3.7 3.8  CL 111 106 110 108  CO2 26 24  --  24  GLUCOSE 79 177* 178* 96  BUN 27* 26* 26* 29*  CREATININE 1.24* 1.41* 1.30* 1.46*  CALCIUM 8.4* 8.8*  --  8.2*  MG  --  2.4  --   --    GFR: Estimated  Creatinine Clearance: 27.7 mL/min (A) (by C-G formula based on SCr of 1.46 mg/dL (H)). Liver Function Tests: Recent Labs  Lab 01/15/20 0913 01/18/20 0532  AST 23 33  ALT 6 13  ALKPHOS 55 81  BILITOT 0.5 0.7  PROT 6.3* 7.7  ALBUMIN 2.7* 3.0*   No results for input(s): LIPASE, AMYLASE in the last 168 hours. No results for input(s): AMMONIA in the last 168 hours. Coagulation Profile: No results for input(s): INR, PROTIME in the last 168 hours. Cardiac Enzymes: No results for input(s): CKTOTAL, CKMB, CKMBINDEX, TROPONINI in the last 168 hours. BNP (last 3 results) No results for input(s): PROBNP in the last 8760 hours. HbA1C: No results for input(s): HGBA1C in the last 72 hours. CBG: Recent Labs  Lab 01/18/20 1301 01/18/20 1743 01/18/20 2121  GLUCAP 98 70 118*   Lipid Profile: No results for input(s): CHOL, HDL, LDLCALC, TRIG, CHOLHDL, LDLDIRECT in the last 72 hours. Thyroid Function Tests: No results for input(s): TSH, T4TOTAL, FREET4, T3FREE, THYROIDAB in the last 72 hours. Anemia Panel: No results for input(s): VITAMINB12, FOLATE, FERRITIN, TIBC, IRON,  RETICCTPCT in the last 72 hours. Sepsis Labs: No results for input(s): PROCALCITON, LATICACIDVEN in the last 168 hours.  Recent Results (from the past 240 hour(s))  Respiratory Panel by RT PCR (Flu A&B, Covid) - Nasopharyngeal Swab     Status: None   Collection Time: 01/18/20  5:34 AM   Specimen: Nasopharyngeal Swab  Result Value Ref Range Status   SARS Coronavirus 2 by RT PCR NEGATIVE NEGATIVE Final    Comment: (NOTE) SARS-CoV-2 target nucleic acids are NOT DETECTED.  The SARS-CoV-2 RNA is generally detectable in upper respiratoy specimens during the acute phase of infection. The lowest concentration of SARS-CoV-2 viral copies this assay can detect is 131 copies/mL. A negative result does not preclude SARS-Cov-2 infection and should not be used as the sole basis for treatment or other patient management decisions.  A negative result may occur with  improper specimen collection/handling, submission of specimen other than nasopharyngeal swab, presence of viral mutation(s) within the areas targeted by this assay, and inadequate number of viral copies (<131 copies/mL). A negative result must be combined with clinical observations, patient history, and epidemiological information. The expected result is Negative.  Fact Sheet for Patients:  PinkCheek.be  Fact Sheet for Healthcare Providers:  GravelBags.it  This test is no t yet approved or cleared by the [place] FDA and  has been authorized for detection and/or diagnosis of SARS-CoV-2 by FDA under an Emergency Use Authorization (EUA). This EUA will remain  in effect (meaning this test can be used) for the duration of the COVID-19 declaration under Section 564(b)(1) of the Act, 21 U.S.C. section 360bbb-3(b)(1), unless the authorization is terminated or revoked sooner.     Influenza A by PCR NEGATIVE NEGATIVE Final   Influenza B by PCR NEGATIVE NEGATIVE Final    Comment: (NOTE) The Xpert Xpress SARS-CoV-2/FLU/RSV assay is intended as an aid in  the diagnosis of influenza from Nasopharyngeal swab specimens and  should not be used as a sole basis for treatment. Nasal washings and  aspirates are unacceptable for Xpert Xpress SARS-CoV-2/FLU/RSV  testing.  Fact Sheet for Patients: PinkCheek.be  Fact Sheet for Healthcare Providers: GravelBags.it  This test is not yet approved or cleared by the [place] FDA and  has been authorized for detection and/or diagnosis of SARS-CoV-2 by  FDA under an Emergency Use Authorization (EUA). This EUA will remain  in effect (meaning this test can be used) for the duration of the  Covid-19 declaration under Section 564(b)(1) of the Act, 21  U.S.C. section 360bbb-3(b)(1), unless the  authorization is  terminated or revoked. Performed at [facility], [place] [address], [place], [place] [address]     Radiology Studies: DG Chest Portable 1 View  Result Date: 01/18/2020 CLINICAL DATA:  Shortness of breath. EXAM: PORTABLE CHEST 1 VIEW COMPARISON:  CT chest 01/01/2020.  Chest x-ray 12/29/2019. FINDINGS: PowerPort catheter stable position. Cardiomegaly. Diffuse bilateral pulmonary interstitial edema/infiltrates noted on today's exam. No pleural effusion. No pneumothorax. Surgical clips right chest. IMPRESSION: 1. PowerPort catheter in stable position. 2. Cardiomegaly. Diffuse bilateral pulmonary interstitial edema/infiltrates noted on today's exam. Electronically Signed   By: [place]   On: 01/18/2020 05:47   Scheduled Meds: . amLODipine  10 mg Oral Daily  . aspirin EC  81 mg Oral Daily  . enoxaparin (LOVENOX) injection  40 mg Subcutaneous Q24H  . furosemide  40 mg Intravenous BID  . hydrALAZINE  50 mg Oral Q8H  . insulin aspart  0-9 Units Subcutaneous  TID WC  . irbesartan  300 mg Oral Daily  . metoprolol succinate  100 mg Oral Daily  . omega-3 acid ethyl esters  2 g Oral Daily  . pantoprazole  40 mg Oral Daily  . potassium chloride  10 mEq Oral Daily  . sodium chloride flush  3 mL Intravenous Q12H   Continuous Infusions: . sodium chloride       LOS: 1 day   Time spent: 40min  Tarrin Menn C Ahmar Pickrell, DO [facility]  If 7PM-7AM, please contact night-coverage [email or web address]  01/19/2020, 7:39 AM

## 2020-01-19 NOTE — Progress Notes (Addendum)
Progress Note  Patient Name: Donna May Date of Encounter: 01/19/2020  [facility] Cardiologist: Sanda Klein, MD   Subjective   Breathing improving. Still on oxygen. No chest pain.   Inpatient Medications    Scheduled Meds:  amLODipine  10 mg Oral Daily   aspirin EC  81 mg Oral Daily   enoxaparin (LOVENOX) injection  40 mg Subcutaneous Q24H   furosemide  40 mg Intravenous BID   hydrALAZINE  50 mg Oral Q8H   insulin aspart  0-9 Units Subcutaneous TID WC   irbesartan  300 mg Oral Daily   metoprolol succinate  100 mg Oral Daily   omega-3 acid ethyl esters  2 g Oral Daily   pantoprazole  40 mg Oral Daily   potassium chloride  10 mEq Oral Daily   sodium chloride flush  3 mL Intravenous Q12H   Continuous Infusions:  sodium chloride     PRN Meds: sodium chloride, acetaminophen, loratadine, nitroGLYCERIN, ondansetron (ZOFRAN) IV, sodium chloride flush   Vital Signs    Vitals:   01/18/20 2140 01/19/20 0055 01/19/20 0535 01/19/20 0640  BP: (!) 161/63 (!) 135/50 (!) 174/69 (!) 171/67  Pulse:  66 63   Resp:  17 20   Temp:  98.2 F (36.8 C) 98 F (36.7 C)   TempSrc:  Oral Oral   SpO2:  100% 100%   Weight:   58.2 kg   Height:        Intake/Output Summary (Last 24 hours) at 01/19/2020 0749 Last data filed at 01/19/2020 0549 Gross per 24 hour  Intake 967 ml  Output 950 ml  Net 17 ml   Last 3 Weights 01/19/2020 01/18/2020 01/15/2020  Weight (lbs) 128 lb 3.2 oz 130 lb 132 lb 3.2 oz  Weight (kg) 58.151 kg 58.968 kg 59.966 kg      Telemetry    NSR - Personally Reviewed  ECG    N/A  Physical Exam   GEN: No acute distress.   Neck: + JVD Cardiac: RRR, no murmurs, rubs, or gallops.  Respiratory: faint rales GI: Soft, nontender, non-distended  MS: Bilateral ankle edema; No deformity. Neuro:  Nonfocal  Psych: Normal affect   Labs    High Sensitivity Troponin:   Recent Labs  Lab 12/28/19 1738 12/28/19 2021 01/18/20 0833 01/18/20 1735  TROPONINIHS  28* 27* 53* 45*      Chemistry Recent Labs  Lab 01/15/20 0913 01/15/20 0913 01/18/20 0532 01/18/20 0549 01/19/20 0119  NA 142   < > 141 146* 141  K 3.9   < > 3.7 3.7 3.8  CL 111   < > 106 110 108  CO2 26  --  24  --  24  GLUCOSE 79   < > 177* 178* 96  BUN 27*   < > 26* 26* 29*  CREATININE 1.24*  --  1.41* 1.30* 1.46*  CALCIUM 8.4*  --  8.8*  --  8.2*  PROT 6.3*  --  7.7  --   --   ALBUMIN 2.7*  --  3.0*  --   --   AST 23  --  33  --   --   ALT 6  --  13  --   --   ALKPHOS 55  --  81  --   --   BILITOT 0.5  --  0.7  --   --   GFRNONAA 44*  --  38*  --  36*  ANIONGAP 5  --  11  --  9   < > = values in this interval not displayed.     Hematology Recent Labs  Lab 01/15/20 0913 01/18/20 0532 01/18/20 0549  WBC 5.7 9.0  --   RBC 2.83* 3.76*  --   HGB 9.0* 12.0 12.6  HCT 28.9* 39.1 37.0  MCV 102.1* 104.0*  --   MCH 31.8 31.9  --   MCHC 31.1 30.7  --   RDW 18.5* 18.6*  --   PLT 345 450*  --     BNP Recent Labs  Lab 01/18/20 0532  BNP 1,816.9*    Radiology    DG Chest Portable 1 View  Result Date: 01/18/2020 CLINICAL DATA:  Shortness of breath. EXAM: PORTABLE CHEST 1 VIEW COMPARISON:  CT chest 01/01/2020.  Chest x-ray 12/29/2019. FINDINGS: PowerPort catheter stable position. Cardiomegaly. Diffuse bilateral pulmonary interstitial edema/infiltrates noted on today's exam. No pleural effusion. No pneumothorax. Surgical clips right chest. IMPRESSION: 1. PowerPort catheter in stable position. 2. Cardiomegaly. Diffuse bilateral pulmonary interstitial edema/infiltrates noted on today's exam. Electronically Signed   By: Marcello Moores  Register   On: 01/18/2020 05:47    Cardiac Studies   Echo 01/03/20 1. Left ventricular ejection fraction, by estimation, is 60 to 65%. The  left ventricle has normal function. The left ventricle has no regional  wall motion abnormalities. There is moderate concentric left ventricular  hypertrophy. Left ventricular  diastolic parameters are  consistent with Grade III diastolic dysfunction  (restrictive).   2. Right ventricular systolic function is normal. The right ventricular  size is normal. There is severely elevated pulmonary artery systolic  pressure.   3. Left atrial size was severely dilated.   4. Right atrial size was moderately dilated.   5. A small pericardial effusion is present. The pericardial effusion is  circumferential.   6. The mitral valve is normal in structure. Mild mitral valve  regurgitation.   7. Tricuspid valve regurgitation is moderate.   8. The aortic valve is tricuspid. Aortic valve regurgitation is not  visualized. No aortic stenosis is present.   9. The inferior vena cava is dilated in size with >50% respiratory  variability, suggesting right atrial pressure of 8 mmHg.  Patient Profile     79 y.o. female  with a hx of HTN, PAD (bilateral non-obstructive renal artery stenosis), HLD, DM2, breast cancer, remote tobacco use, cholangiocarcinoma with mets to the peritoneum, CKD Stage III and neuropathy seen for CHF.  Assessment & Plan    1. Acute on chronic diastolic CHF/Acute hypoxic respiratory failure - BNP 1816 - CXR cardiomegaly with diffuse bilateral pulmonary interstitial edema. - Dry weight around 125lb - Net I  & O +17cc. Doubt accurate.  - Lost 2 lb overnight (130>>128lb) - Reports eating out most of the time>>consider diatrition consult  - Continue IV lasix  2. Hypertensive urgency - BP of 230/150 upon EMS arrival  - BP improving on current medications however consider adjusting hydralazine to BID dose prior to discharge   3. CKD III - Scr stable at 1.3-1.4  For questions or updates, please contact [place] Please consult [email or web address] for contact info under        SignedLeanor Kail, PA  01/19/2020, 7:49 AM    Personally seen and examined. Agree with APP above with the following comments: Briefly 79 yo F with history of HfpEF, Diabetes with HTN and CKD Stage  IIIA Patient notes improvement in SOB Exam notable for slight crackles but much improved.  Weaned  patient off oxygen.  Presently sats 98 % with good waveform. Labs notable for increase in creatinine to 1.46 Would recommend continued IV diuresis possible transition to PO 01/20/20; agree with uptitration of of hydralazine; peridischarge will likely only be able to take medications daily or BID Could be Farixa candidate  Werner Lean, MD

## 2020-01-19 NOTE — Progress Notes (Addendum)
HEMATOLOGY-ONCOLOGY PROGRESS NOTE  SUBJECTIVE: [name] was readmitted for congestive heart failure exacerbation and hypertensive urgency.  She was started on IV diuresis.  She reports that she is feeling better today.  Her shortness of breath is improved.  Lower extremity edema is better.  Oncology History Overview Note  Cancer Staging Intrahepatic cholangiocarcinoma ([place]) Staging form: Intrahepatic Bile Duct, AJCC 8th Edition - Clinical stage from 09/23/2018: Stage IB (cT1b, cN0, cM0) - Signed by [name], MD on 10/06/2018    Malignant neoplasm of female breast ([place])  11/06/2018 Genetic Testing   Negative genetic testing on the [facility] Common Hereditary Cancers panel. A variant of uncertain significance was identified in one of her APC genes, called c.1243G>A (p.Ala415Thr).  The Common Hereditary Cancers Panel offered by [facility] includes sequencing and/or deletion duplication testing of the following 48 genes: APC, ATM, AXIN2, BARD1, BMPR1A, BRCA1, BRCA2, BRIP1, CDH1, CDK4, CDKN2A (p14ARF), CDKN2A (p16INK4a), CHEK2, CTNNA1, DICER1, EPCAM (Deletion/duplication testing only), GREM1 (promoter region deletion/duplication testing only), KIT, MEN1, MLH1, MSH2, MSH3, MSH6, MUTYH, NBN, NF1, NHTL1, PALB2, PDGFRA, PMS2, POLD1, POLE, PTEN, RAD50, RAD51C, RAD51D, RNF43, SDHB, SDHC, SDHD, SMAD4, SMARCA4. STK11, TP53, TSC1, TSC2, and VHL.  The following genes were evaluated for sequence changes only: SDHA and HOXB13 c.251G>A variant only.    Intrahepatic cholangiocarcinoma ([place])  09/07/2018 Imaging   CT Chest IMPRESSION: 1. New, enhancing mass involving segment 4 of the liver and fundus of gallbladder is concerning for malignancy. This may represent either metastatic disease from breast cancer or neoplasm primary to the liver or hepatic biliary tree. Further evaluation with contrast enhanced CT of the abdomen and pelvis is recommended. 2. No findings to suggest metastatic disease within the chest. 3.  Aortic  Atherosclerosis (ICD10-I70.0). 4. Coronary artery calcifications.   09/13/2018 Pathology Results   Diagnosis Liver, needle/core biopsy - ADENOCARCINOMA. Microscopic Comment Immunohistochemistry for CK7 is positive. CK20, TTF1, CDX-2, GATA-3, PAX 8, Qualitative ER, p63 and CK5/6 are negative. The provided clinical history of remote mammary carcinoma is noted. Based on the morphology and immunophenotype of the adenocarcinoma observed in this specimen, primary cholangiocarcinoma is favored. Clinical and radiologic correlation are  encouraged. Results reported to [employer] on 09/15/2018. Intradepartmental consultation (Dr. Vic Ripper).   09/13/2018 Initial Diagnosis   Cholangiocarcinoma ([place])   09/23/2018 Procedure   Colonoscopy by Dr. Havery Moros 09/23/18  IMPRESSION - Two 3 to 4 mm polyps in the ascending colon, removed with a cold snare. Resected and retrieved. - Five 3 to 5 mm polyps in the transverse colon, removed with a cold snare. Resected and retrieved. - One 5 mm polyp at the splenic flexure, removed with a cold snare. Resected and retrieved. - Three 3 to 5 mm polyps in the sigmoid colon, removed with a cold snare. Resected and retrieved. - The examination was otherwise normal. Upper Endopscy by Dr. Havery Moros 09/23/18  IMPRESSION - Esophagogastric landmarks identified. - 2 cm hiatal hernia. - Normal esophagus otherwise. - A single gastric polyp. Resected and retrieved. - Mild gastritis. Biopsied. - Normal duodenal bulb and second portion of the duodenum.   09/23/2018 Pathology Results   Diagnosis 09/23/18 1. Surgical [P], duodenum - BENIGN SMALL BOWEL MUCOSA. - NO ACTIVE INFLAMMATION OR VILLOUS ATROPHY IDENTIFIED. 2. Surgical [P], stomach, polyp - HYPERPLASTIC POLYP(S). - THERE IS NO EVIDENCE OF MALIGNANCY. 3. Surgical [P], gastric antrum and gastric body - CHRONIC INACTIVE GASTRITIS. - THERE IS NO EVIDENCE OF HELICOBACTER-PYLORI, DYSPLASIA, OR MALIGNANCY. - SEE  COMMENT. 4. Surgical [P], colon, sigmoid, splenic flexure, transverse  and ascending, polyp (9) - TUBULAR ADENOMA(S). - SESSILE SERRATED POLYP WITHOUT CYTOLOGIC DYSPLASIA. - HIGH GRADE DYSPLASIA IS NOT IDENTIFIED. 5. Surgical [P], colon, sigmoid, polyp (2) - HYPERPLASTIC POLYP(S). - THERE IS NO EVIDENCE OF MALIGNANCY.   09/23/2018 Cancer Staging   Staging form: Intrahepatic Bile Duct, AJCC 8th Edition - Clinical stage from 09/23/2018: Stage IB (cT1b, cN0, cM0) - Signed by [name], MD on 10/06/2018   09/29/2018 PET scan   PET 09/29/18 IMPRESSION: 1. Hypermetabolic mass in the RIGHT hepatic lobe consistent with biopsy proven adenocarcinoma. No additional liver metastasis. 2. No evidence of local breast cancer recurrence in the RIGHT breast or RIGHT axilla. 3. Mild bilateral hypermetabolic adrenal glands is favored benign hyperplasia. 4. No evidence of additional metastatic disease on skull base to thigh FDG PET scan.   11/06/2018 Genetic Testing   Negative genetic testing on the [facility] Common Hereditary Cancers panel. A variant of uncertain significance was identified in one of her APC genes, called c.1243G>A (p.Ala415Thr).  The Common Hereditary Cancers Panel offered by [facility] includes sequencing and/or deletion duplication testing of the following 48 genes: APC, ATM, AXIN2, BARD1, BMPR1A, BRCA1, BRCA2, BRIP1, CDH1, CDK4, CDKN2A (p14ARF), CDKN2A (p16INK4a), CHEK2, CTNNA1, DICER1, EPCAM (Deletion/duplication testing only), GREM1 (promoter region deletion/duplication testing only), KIT, MEN1, MLH1, MSH2, MSH3, MSH6, MUTYH, NBN, NF1, NHTL1, PALB2, PDGFRA, PMS2, POLD1, POLE, PTEN, RAD50, RAD51C, RAD51D, RNF43, SDHB, SDHC, SDHD, SMAD4, SMARCA4. STK11, TP53, TSC1, TSC2, and VHL.  The following genes were evaluated for sequence changes only: SDHA and HOXB13 c.251G>A variant only.    11/17/2018 Pathology Results   Diagnosis 11/17/18 1. Soft tissue, biopsy, Diaphragmatic nodules - METASTATIC  ADENOCARCINOMA, CONSISTENT WITH PATIENT'S CLINICAL HISTORY OF CHOLANGIOCARCINOMA. SEE NOTE 2. Liver, biopsy, Left - LIVER PARENCHYMA WITH A BENIGN FIBROTIC NODULE - NO EVIDENCE OF MALIGNANCY 3. Stomach, biopsy - BENIGN PAPILLARY MESOTHELIAL HYPERPLASIA - NO EVIDENCE OF MALIGNANCY   11/29/2018 Imaging   CT CAP WO Contrast  IMPRESSION: 1. Dominant liver mass appears grossly stable from 09/29/2018. Additional liver lesions are too small to characterize but were not shown to be hypermetabolic on PET. 2. Mild nodularity of both adrenal glands with associated hypermetabolism on 09/29/2018. Continued attention on follow-up exams is warranted. 3. Small right lower lobe nodules, stable from 09/07/2018. Again, attention on follow-up is recommended. 4. Trace bilateral pleural fluid. 5. Aortic atherosclerosis (ICD10-170.0). Coronary artery calcification. 6. Enlarged pulmonic trunk, indicative of pulmonary arterial hypertension.     11/30/2018 - 06/14/2019 Chemotherapy   First line chemo Oxaliplatin and gemcitabine q2weeks starting 11/30/18. Stopped Oxaliplatin on 05/03/19 due to worsening neuropathy. Added Cisplatin with C13 on 05/17/19 and stopped on C15 06/14/19 due to neuropathy. Reduced to maintenance single agent Gemcitabine on 06/28/19.    01/20/2019 Imaging   CT CAP IMPRESSION: restaging  1. Mild interval increase in size of dominant liver mass involving segment 4 and segment 5. 2. No significant or progressive adrenal nodularity identified to suggest metastatic disease. 3. Unchanged appearance of small right lower lobe and lingular lung nodules, nonspecific.   03/21/2019 PET scan   IMPRESSION: 1. Large right hepatic mass with SUV uptake near background hepatic activity, dramatic response to therapy, also with decrease in size when compared to the prior study. 2. Signs of prior right mastectomy and axillary dissection as before. 3. Left adrenal activity in no longer above the level of  activity seen in the contralateral, right adrenal gland or in the liver. Attention on follow-up. 4. No new signs of disease.  06/27/2019 PET scan   IMPRESSION: 1. Further decrease in size of a dominant hepatic mass which is non FDG avid. 2. No evidence of metastatic disease. 3. No evidence of left adrenal hypermetabolism or mass. 4. Incidental findings, including: Uterine fibroids. Coronary artery atherosclerosis. Aortic Atherosclerosis (ICD10-I70.0). Pulmonary artery enlargement suggests pulmonary arterial hypertension.   06/28/2019 -  Chemotherapy   Maintenance single agent Gemcitabine q2weeks  starting on 06/28/19 with C16.    09/15/2019 PET scan   IMPRESSION: 1. In the region of regional tumor at the junction of the right and left hepatic lobe, there is continued hypodensity and overall activity slightly less than that of the surrounding normal liver, indicating effective response to therapy. No current worrisome hypermetabolic lesion is identified in the liver or elsewhere. 2. Chronic asymmetric edema in the subcutaneous tissues of the right upper extremity, nonspecific. The patient has had a prior right mastectomy and right axillary dissection. 3. Other imaging findings of potential clinical significance: Aortic Atherosclerosis (ICD10-I70.0). Coronary atherosclerosis. Mild cardiomegaly. Small right and trace left pleural effusions. Subcutaneous and mild mesenteric edema. Suspected uterine fibroids.   12/18/2019 PET scan   IMPRESSION: 1. No significant abnormal activity to suggest active/recurrent malignancy. 2. Lingual tonsillar activity is mildly increased from prior but probably incidental/physiologic, attention on follow up suggested. 3. Other imaging findings of potential clinical significance: Third spacing of fluid with trace ascites, mesenteric edema, subcutaneous edema, and possibly subtle pulmonary edema. Mild cardiomegaly. Aortic Atherosclerosis (ICD10-I70.0).  Coronary atherosclerosis. Right ovarian dermoid. Calcified uterine fibroids. Renal cysts. Right glenohumeral arthropathy. Mild to moderate scoliosis. Suspected pulmonary arterial hypertension.      REVIEW OF SYSTEMS:   Constitutional: Denies fevers, chills  Respiratory: Shortness of breath improved with diuresis Cardiovascular: Denies palpitation, chest discomfort Gastrointestinal:  Denies nausea, heartburn or change in bowel habits Skin: Denies abnormal skin rashes Lymphatics: Denies new lymphadenopathy or easy bruising Neurological:Denies numbness, tingling or new weaknesses Behavioral/Psych: Mood is stable, no new changes  Extremities: Lower extremity edema improved All other systems were reviewed with the patient and are negative.  I have reviewed the past medical history, past surgical history, social history and family history with the patient and they are unchanged from previous note.   PHYSICAL EXAMINATION: ECOG PERFORMANCE STATUS: 2 - Symptomatic, <50% confined to bed  Vitals:   01/19/20 1052 01/19/20 1421  BP: (!) 147/60 (!) 141/62  Pulse: 63   Resp: 18   Temp: 98.2 F (36.8 C)   SpO2: 100%    Filed Weights   01/18/20 0538 01/19/20 0535  Weight: 59 kg 58.2 kg    Intake/Output from previous day: 11/04 0701 - 11/05 0700 In: 967 [P.O.:957; I.V.:10] Out: 950 [Urine:950]  GENERAL:alert, no distress and comfortable OROPHARYNX:no exudate, no erythema and lips, buccal mucosa, and tongue normal  LUNGS: Rales to bases HEART: regular rate & rhythm and no murmurs, trace lower extremity edema ABDOMEN:abdomen soft, non-tender and normal bowel sounds Musculoskeletal:no cyanosis of digits and no clubbing  NEURO: alert & oriented x 3 with fluent speech, no focal motor/sensory deficits  LABORATORY DATA:  I have reviewed the data as listed CMP Latest Ref Rng & Units 01/19/2020 01/18/2020 01/18/2020  Glucose 70 - 99 mg/dL 96 178(H) 177(H)  BUN 8 - 23 mg/dL 29(H) 26(H)  26(H)  Creatinine 0.44 - 1.00 mg/dL 1.46(H) 1.30(H) 1.41(H)  Sodium 135 - 145 mmol/L 141 146(H) 141  Potassium 3.5 - 5.1 mmol/L 3.8 3.7 3.7  Chloride 98 - 111 mmol/L 108 110 106  CO2  22 - 32 mmol/L 24 - 24  Calcium 8.9 - 10.3 mg/dL 8.2(L) - 8.8(L)  Total Protein 6.5 - 8.1 g/dL - - 7.7  Total Bilirubin 0.3 - 1.2 mg/dL - - 0.7  Alkaline Phos 38 - 126 U/L - - 81  AST 15 - 41 U/L - - 33  ALT 0 - 44 U/L - - 13    Lab Results  Component Value Date   WBC 9.0 01/18/2020   HGB 12.6 01/18/2020   HCT 37.0 01/18/2020   MCV 104.0 (H) 01/18/2020   PLT 450 (H) 01/18/2020   NEUTROABS 3.9 01/15/2020    CT ABDOMEN PELVIS WO CONTRAST  Result Date: 01/01/2020 CLINICAL DATA:  79 year old female with shortness of breath and productive cough. History of cholangiocarcinoma. EXAM: CT CHEST, ABDOMEN AND PELVIS WITHOUT CONTRAST TECHNIQUE: Multidetector CT imaging of the chest, abdomen and pelvis was performed following the standard protocol without IV contrast. COMPARISON:  Chest radiograph dated 12/29/2019 and CT dated 01/20/2019. FINDINGS: Evaluation of this exam is limited in the absence of intravenous contrast. CT CHEST FINDINGS Cardiovascular: Borderline cardiomegaly. No pericardial effusion. There is coronary vascular calcification. There is hypoattenuation of the cardiac blood pool suggestive of anemia. Clinical correlation is recommended. There is moderate atherosclerotic calcification of the thoracic aorta. No aneurysmal dilatation. There is dilatation of the main pulmonary trunk suggestive of a degree of pulmonary hypertension. Clinical correlation is recommended. Left-sided Port-A-Cath with tip at the cavoatrial junction. Mediastinum/Nodes: No definite hilar or mediastinal adenopathy. Evaluation however is limited in the absence of intravenous contrast. The esophagus and the thyroid gland are grossly unremarkable as visualized. No mediastinal fluid collection. Lungs/Pleura: Small bilateral pleural  effusions. There are subsegmental bilateral lower lobe consolidative changes which may represent atelectasis or pneumonia. Similar appearance of a 5 mm nodular density in the right lower lobe (95/7). There is no pneumothorax. The central airways are patent. Musculoskeletal: Degenerative changes of the spine. No acute osseous pathology. CT ABDOMEN PELVIS FINDINGS No intra-abdominal free air or free fluid. Hepatobiliary: Several small hepatic hypodense lesions which are not characterized on this noncontrast CT but demonstrate fluid attenuation, likely cysts. These appear similar to prior CT. There is interval decrease in the size of the segment 4 low attenuating area now measuring approximately 2.2 x 4.3 cm (previously 4.2 x 7.0 cm). The gallbladder is unremarkable as visualized. Pancreas: The pancreas is grossly unremarkable. Spleen: Normal in size without focal abnormality. Adrenals/Urinary Tract: The adrenal glands unremarkable. There is no hydronephrosis or nephrolithiasis on either side. Multiple bilateral renal cysts as seen previously. The visualized ureters and urinary bladder appear unremarkable. Stomach/Bowel: There is moderate stool throughout the colon. There is no bowel obstruction or active inflammation. The appendix is normal. Vascular/Lymphatic: Advanced aortoiliac atherosclerotic disease. The IVC is unremarkable. No portal venous gas. There is no adenopathy. Reproductive: Small partially calcified uterine fibroids. Other: Diffuse subcutaneous edema and anasarca new since the prior CT. Musculoskeletal: Degenerative changes.  No acute osseous pathology. IMPRESSION: 1. Small bilateral pleural effusions with subsegmental bilateral lower lobe consolidative changes which may represent atelectasis or pneumonia. Clinical correlation is recommended. This finding is new compared to prior CT. 2. Interval decrease in the size of the segment IV hepatic low attenuating area. 3. Anasarca, new since the prior CT. 4.  Aortic Atherosclerosis (ICD10-I70.0). Electronically Signed   By: Anner Crete M.D.   On: 01/01/2020 23:17   CT CHEST WO CONTRAST  Result Date: 01/01/2020 CLINICAL DATA:  79 year old female with shortness of breath and  productive cough. History of cholangiocarcinoma. EXAM: CT CHEST, ABDOMEN AND PELVIS WITHOUT CONTRAST TECHNIQUE: Multidetector CT imaging of the chest, abdomen and pelvis was performed following the standard protocol without IV contrast. COMPARISON:  Chest radiograph dated 12/29/2019 and CT dated 01/20/2019. FINDINGS: Evaluation of this exam is limited in the absence of intravenous contrast. CT CHEST FINDINGS Cardiovascular: Borderline cardiomegaly. No pericardial effusion. There is coronary vascular calcification. There is hypoattenuation of the cardiac blood pool suggestive of anemia. Clinical correlation is recommended. There is moderate atherosclerotic calcification of the thoracic aorta. No aneurysmal dilatation. There is dilatation of the main pulmonary trunk suggestive of a degree of pulmonary hypertension. Clinical correlation is recommended. Left-sided Port-A-Cath with tip at the cavoatrial junction. Mediastinum/Nodes: No definite hilar or mediastinal adenopathy. Evaluation however is limited in the absence of intravenous contrast. The esophagus and the thyroid gland are grossly unremarkable as visualized. No mediastinal fluid collection. Lungs/Pleura: Small bilateral pleural effusions. There are subsegmental bilateral lower lobe consolidative changes which may represent atelectasis or pneumonia. Similar appearance of a 5 mm nodular density in the right lower lobe (95/7). There is no pneumothorax. The central airways are patent. Musculoskeletal: Degenerative changes of the spine. No acute osseous pathology. CT ABDOMEN PELVIS FINDINGS No intra-abdominal free air or free fluid. Hepatobiliary: Several small hepatic hypodense lesions which are not characterized on this noncontrast CT but  demonstrate fluid attenuation, likely cysts. These appear similar to prior CT. There is interval decrease in the size of the segment 4 low attenuating area now measuring approximately 2.2 x 4.3 cm (previously 4.2 x 7.0 cm). The gallbladder is unremarkable as visualized. Pancreas: The pancreas is grossly unremarkable. Spleen: Normal in size without focal abnormality. Adrenals/Urinary Tract: The adrenal glands unremarkable. There is no hydronephrosis or nephrolithiasis on either side. Multiple bilateral renal cysts as seen previously. The visualized ureters and urinary bladder appear unremarkable. Stomach/Bowel: There is moderate stool throughout the colon. There is no bowel obstruction or active inflammation. The appendix is normal. Vascular/Lymphatic: Advanced aortoiliac atherosclerotic disease. The IVC is unremarkable. No portal venous gas. There is no adenopathy. Reproductive: Small partially calcified uterine fibroids. Other: Diffuse subcutaneous edema and anasarca new since the prior CT. Musculoskeletal: Degenerative changes.  No acute osseous pathology. IMPRESSION: 1. Small bilateral pleural effusions with subsegmental bilateral lower lobe consolidative changes which may represent atelectasis or pneumonia. Clinical correlation is recommended. This finding is new compared to prior CT. 2. Interval decrease in the size of the segment IV hepatic low attenuating area. 3. Anasarca, new since the prior CT. 4. Aortic Atherosclerosis (ICD10-I70.0). Electronically Signed   By: Anner Crete M.D.   On: 01/01/2020 23:17   DG Chest Portable 1 View  Result Date: 01/18/2020 CLINICAL DATA:  Shortness of breath. EXAM: PORTABLE CHEST 1 VIEW COMPARISON:  CT chest 01/01/2020.  Chest x-ray 12/29/2019. FINDINGS: PowerPort catheter stable position. Cardiomegaly. Diffuse bilateral pulmonary interstitial edema/infiltrates noted on today's exam. No pleural effusion. No pneumothorax. Surgical clips right chest. IMPRESSION: 1.  PowerPort catheter in stable position. 2. Cardiomegaly. Diffuse bilateral pulmonary interstitial edema/infiltrates noted on today's exam. Electronically Signed   By: Marcello Moores  Register   On: 01/18/2020 05:47   DG CHEST PORT 1 VIEW  Result Date: 12/29/2019 CLINICAL DATA:  Follow-up pulmonary edema EXAM: PORTABLE CHEST 1 VIEW COMPARISON:  12/27/2019 FINDINGS: Cardiac shadow remains enlarged. Left chest wall port is again noted. Resolution of previously seen airspace opacities is noted consistent with resolving edema. No bony abnormality is noted. IMPRESSION: Previously seen edema has resolved in  the interval. No new focal abnormality is seen. Electronically Signed   By: Inez Catalina M.D.   On: 12/29/2019 19:22   DG Chest Port 1 View  Result Date: 12/27/2019 CLINICAL DATA:  79 year old female with shortness of breath. Symptom onset 1800 hours. Extremity swelling. EXAM: PORTABLE CHEST 1 VIEW COMPARISON:  Portable chest 10/20/2019 and earlier. FINDINGS: Portable AP semi upright view at 2107 hours. Stable cardiomegaly and mediastinal contours. Stable left chest power port. Asymmetric patchy and interstitial pulmonary opacity greater in the right lung. Basilar predominance. Appearance is similar but less pronounced than that in 2022/11/17. No pneumothorax. No pleural effusion is evident. No acute osseous abnormality identified. Negative visible bowel gas pattern. Chronic right trust wall surgical clips. IMPRESSION: Chronic cardiomegaly with asymmetric lung opacity favored due to pulmonary edema. Viral/atypical respiratory infection is felt less likely. Electronically Signed   By: Genevie Ann M.D.   On: 12/27/2019 21:26   ECHOCARDIOGRAM COMPLETE  Result Date: 01/03/2020    ECHOCARDIOGRAM REPORT   Patient Name:   Donna May Date of Exam: 01/03/2020 Medical Rec #:  [id number]     Height:       65.0 in Accession #:    [id number]    Weight:       126.5 lb Date of Birth:  September 14, 1940    BSA:          1.628 m Patient Age:     57 years      BP:           137/56 mmHg Patient Gender: F             HR:           68 bpm. Exam Location:  Inpatient Procedure: 2D Echo, Cardiac Doppler and Color Doppler Indications:    Fever  History:        Patient has prior history of Echocardiogram examinations, most                 recent 10/19/2019. CHF; Risk Factors:Diabetes, Hypertension,                 Former Smoker and Dyslipidemia. CKD.  Sonographer:    Clayton Lefort RDCS (AE) Referring Phys: [id number] JENNIFER CHOI IMPRESSIONS  1. Left ventricular ejection fraction, by estimation, is 60 to 65%. The left ventricle has normal function. The left ventricle has no regional wall motion abnormalities. There is moderate concentric left ventricular hypertrophy. Left ventricular diastolic parameters are consistent with Grade III diastolic dysfunction (restrictive).  2. Right ventricular systolic function is normal. The right ventricular size is normal. There is severely elevated pulmonary artery systolic pressure.  3. Left atrial size was severely dilated.  4. Right atrial size was moderately dilated.  5. A small pericardial effusion is present. The pericardial effusion is circumferential.  6. The mitral valve is normal in structure. Mild mitral valve regurgitation.  7. Tricuspid valve regurgitation is moderate.  8. The aortic valve is tricuspid. Aortic valve regurgitation is not visualized. No aortic stenosis is present.  9. The inferior vena cava is dilated in size with >50% respiratory variability, suggesting right atrial pressure of 8 mmHg. Comparison(s): No prior Echocardiogram. Conclusion(s)/Recommendation(s): Severely elevated right sided filling pressures, grade 3 diastilic dysfunction. FINDINGS  Left Ventricle: Myocardial appearance suggests possibility of amyloid. Left ventricular ejection fraction, by estimation, is 60 to 65%. The left ventricle has normal function. The left ventricle has no regional wall motion abnormalities. The left ventricular  internal cavity size was  normal in size. There is moderate concentric left ventricular hypertrophy. Left ventricular diastolic parameters are consistent with Grade III diastolic dysfunction (restrictive). Right Ventricle: The right ventricular size is normal. No increase in right ventricular wall thickness. Right ventricular systolic function is normal. There is severely elevated pulmonary artery systolic pressure. The tricuspid regurgitant velocity is 4.10 m/s, and with an assumed right atrial pressure of 8 mmHg, the estimated right ventricular systolic pressure is 17.4 mmHg. Left Atrium: Left atrial size was severely dilated. Right Atrium: Right atrial size was moderately dilated. Pericardium: A small pericardial effusion is present. The pericardial effusion is circumferential. Mitral Valve: The mitral valve is normal in structure. There is mild thickening of the mitral valve leaflet(s). There is mild calcification of the mitral valve leaflet(s). Mild mitral valve regurgitation. MV peak gradient, 4.6 mmHg. The mean mitral valve  gradient is 1.0 mmHg. Tricuspid Valve: The tricuspid valve is normal in structure. Tricuspid valve regurgitation is moderate. Aortic Valve: The aortic valve is tricuspid. Aortic valve regurgitation is not visualized. No aortic stenosis is present. Aortic valve mean gradient measures 3.0 mmHg. Aortic valve peak gradient measures 6.1 mmHg. Aortic valve area, by VTI measures 2.53 cm. Pulmonic Valve: The pulmonic valve was grossly normal. Pulmonic valve regurgitation is trivial. No evidence of pulmonic stenosis. Aorta: The aortic root and ascending aorta are structurally normal, with no evidence of dilitation. Venous: The inferior vena cava is dilated in size with greater than 50% respiratory variability, suggesting right atrial pressure of 8 mmHg. IAS/Shunts: The atrial septum is grossly normal.  LEFT VENTRICLE PLAX 2D LVIDd:         3.90 cm  Diastology LVIDs:         2.60 cm  LV e' medial:     6.42 cm/s LV PW:         1.70 cm  LV E/e' medial:  14.8 LV IVS:        1.60 cm  LV e' lateral:   9.36 cm/s LVOT diam:     2.00 cm  LV E/e' lateral: 10.1 LV SV:         63 LV SV Index:   38 LVOT Area:     3.14 cm  RIGHT VENTRICLE             IVC RV Basal diam:  3.70 cm     IVC diam: 2.10 cm RV S prime:     11.40 cm/s TAPSE (M-mode): 1.9 cm LEFT ATRIUM             Index       RIGHT ATRIUM           Index LA diam:        4.60 cm 2.83 cm/m  RA Area:     18.30 cm LA Vol (A2C):   96.5 ml 59.27 ml/m RA Volume:   50.20 ml  30.83 ml/m LA Vol (A4C):   80.5 ml 49.44 ml/m LA Biplane Vol: 96.2 ml 59.08 ml/m  AORTIC VALVE AV Area (Vmax):    2.58 cm AV Area (Vmean):   2.41 cm AV Area (VTI):     2.53 cm AV Vmax:           123.00 cm/s AV Vmean:          83.000 cm/s AV VTI:            0.247 m AV Peak Grad:      6.1 mmHg AV Mean Grad:  3.0 mmHg LVOT Vmax:         101.00 cm/s LVOT Vmean:        63.700 cm/s LVOT VTI:          0.199 m LVOT/AV VTI ratio: 0.81  AORTA Ao Root diam: 3.20 cm Ao Asc diam:  2.90 cm MITRAL VALVE               TRICUSPID VALVE MV Area (PHT): 2.80 cm    TR Peak grad:   67.2 mmHg MV Peak grad:  4.6 mmHg    TR Vmax:        410.00 cm/s MV Mean grad:  1.0 mmHg MV Vmax:       1.07 m/s    SHUNTS MV Vmean:      53.7 cm/s   Systemic VTI:  0.20 m MV Decel Time: 271 msec    Systemic Diam: 2.00 cm MV E velocity: 94.70 cm/s MV A velocity: 32.10 cm/s MV E/A ratio:  2.95 Buford Dresser MD Electronically signed by Buford Dresser MD Signature Date/Time: 01/03/2020/8:19:23 PM    Final    VAS [place] LOWER EXTREMITY VENOUS (DVT)  Result Date: 01/03/2020  Lower Venous DVTStudy Indications: Fever of unkown origin.  Comparison Study: No prior studies. Performing Technologist: Darlin Coco  Examination Guidelines: A complete evaluation includes B-mode imaging, spectral Doppler, color Doppler, and power Doppler as needed of all accessible portions of each vessel. Bilateral testing is considered an integral  part of a complete examination. Limited examinations for reoccurring indications may be performed as noted. The reflux portion of the exam is performed with the patient in reverse Trendelenburg.  +---------+---------------+---------+-----------+----------+--------------+ RIGHT    CompressibilityPhasicitySpontaneityPropertiesThrombus Aging +---------+---------------+---------+-----------+----------+--------------+ CFV      Full           Yes      Yes                                 +---------+---------------+---------+-----------+----------+--------------+ SFJ      Full                                                        +---------+---------------+---------+-----------+----------+--------------+ FV Prox  Full                                                        +---------+---------------+---------+-----------+----------+--------------+ FV Mid   Full                                                        +---------+---------------+---------+-----------+----------+--------------+ FV DistalFull                                                        +---------+---------------+---------+-----------+----------+--------------+ PFV      Full                                                        +---------+---------------+---------+-----------+----------+--------------+  POP      Full           Yes      Yes                                 +---------+---------------+---------+-----------+----------+--------------+ PTV      Full                                                        +---------+---------------+---------+-----------+----------+--------------+ PERO     Full                                                        +---------+---------------+---------+-----------+----------+--------------+   +---------+---------------+---------+-----------+----------+--------------+ LEFT     CompressibilityPhasicitySpontaneityPropertiesThrombus Aging  +---------+---------------+---------+-----------+----------+--------------+ CFV      Full           Yes      Yes                                 +---------+---------------+---------+-----------+----------+--------------+ SFJ      Full                                                        +---------+---------------+---------+-----------+----------+--------------+ FV Prox  Full                                                        +---------+---------------+---------+-----------+----------+--------------+ FV Mid   Full                                                        +---------+---------------+---------+-----------+----------+--------------+ FV DistalFull                                                        +---------+---------------+---------+-----------+----------+--------------+ PFV      Full                                                        +---------+---------------+---------+-----------+----------+--------------+ POP      Full           Yes      Yes                                 +---------+---------------+---------+-----------+----------+--------------+  PTV      Full                                                        +---------+---------------+---------+-----------+----------+--------------+ PERO     Full                                                        +---------+---------------+---------+-----------+----------+--------------+     Summary: RIGHT: - There is no evidence of deep vein thrombosis in the lower extremity.  - No cystic structure found in the popliteal fossa.  LEFT: - There is no evidence of deep vein thrombosis in the lower extremity.  - No cystic structure found in the popliteal fossa.  *See table(s) above for measurements and observations. Electronically signed by Harold Barban MD on 01/03/2020 at 7:39:03 PM.    Final     ASSESSMENT AND PLAN: 1.  Intrahepatic cholangiocarcinoma with peritoneal  metastasis 2.  Acute on chronic diastolic CHF 3.  Acute hypoxic respiratory failure secondary to #2, improved 4.  Hypertensive urgency 5.  CKD stage III 6.  History of right breast cancer 7.  Diabetes  -The patient's recent PET scan showed no hypermetabolic activity to suggest active/recurrent malignancy.  At her last visit, we discussed going on a chemotherapy holiday for 2 to 3 months given her excellent response on PET scan and overall poor tolerance to chemotherapy.  She agreed to a chemotherapy break.  We will plan to repeat a CT or PET scan in early January or late December. -The patient is feeling much better with diuresis.  Cardiology is following and she will continue on IV diuretics for now. -Blood pressure improving with current medications. -The patient inquired about obtaining a COVID-19 booster.  She has not yet due for this but will be soon.  She request the Moderna vaccine specifically.  I have checked with our office, and we only have the [place] vaccine available for booster.  I have recommended for her to seek out another location to obtain the [place] booster when she is ready. -The patient inquired about paperwork needed for work that she brought to our office earlier this week.  Dr. Ernestina Penna nurse is located the paperwork and will complete it and fax it to the patient's employer.   LOS: 1 day   Mikey Bussing, DNP, AGPCNP-BC, AOCNP 01/19/20   Addendum  I have seen the patient, examined her. I agree with the assessment and and plan and have edited the notes.   Donna May is feeling better today, she is responding to diuretics. I answered her questions. I will see her back in my clinic in a few weeks as scheduled.  [name]  01/19/2020

## 2020-01-19 NOTE — Progress Notes (Signed)
Pt does not want bipap at this time 

## 2020-01-20 DIAGNOSIS — I160 Hypertensive urgency: Secondary | ICD-10-CM | POA: Diagnosis not present

## 2020-01-20 DIAGNOSIS — N1832 Chronic kidney disease, stage 3b: Secondary | ICD-10-CM | POA: Diagnosis not present

## 2020-01-20 DIAGNOSIS — E1122 Type 2 diabetes mellitus with diabetic chronic kidney disease: Secondary | ICD-10-CM | POA: Diagnosis not present

## 2020-01-20 DIAGNOSIS — I5033 Acute on chronic diastolic (congestive) heart failure: Secondary | ICD-10-CM | POA: Diagnosis not present

## 2020-01-20 LAB — BASIC METABOLIC PANEL
Anion gap: 8 (ref 5–15)
BUN: 27 mg/dL — ABNORMAL HIGH (ref 8–23)
CO2: 25 mmol/L (ref 22–32)
Calcium: 8.4 mg/dL — ABNORMAL LOW (ref 8.9–10.3)
Chloride: 106 mmol/L (ref 98–111)
Creatinine, Ser: 1.71 mg/dL — ABNORMAL HIGH (ref 0.44–1.00)
GFR, Estimated: 30 mL/min — ABNORMAL LOW (ref >60)
Glucose, Bld: 83 mg/dL (ref 70–99)
Potassium: 3.8 mmol/L (ref 3.5–5.1)
Sodium: 139 mmol/L (ref 135–145)

## 2020-01-20 LAB — GLUCOSE, CAPILLARY
Glucose-Capillary: 72 mg/dL (ref 70–99)
Glucose-Capillary: 103 mg/dL — ABNORMAL HIGH (ref 70–99)
Glucose-Capillary: 82 mg/dL (ref 70–99)
Glucose-Capillary: 130 mg/dL — ABNORMAL HIGH (ref 70–99)

## 2020-01-20 MED ORDER — HYDRALAZINE HCL 50 MG PO TABS
100.0000 mg | ORAL_TABLET | Freq: Three times a day (TID) | ORAL | Status: DC
  Administered 2020-01-20 – 2020-01-22 (×6): 100 mg via ORAL
  Filled 2020-01-20 (×6): qty 2

## 2020-01-20 MED ORDER — FUROSEMIDE 40 MG PO TABS
40.0000 mg | ORAL_TABLET | Freq: Every day | ORAL | Status: DC
  Administered 2020-01-21 – 2020-01-22 (×2): 40 mg via ORAL
  Filled 2020-01-20 (×2): qty 1

## 2020-01-20 NOTE — Plan of Care (Signed)
Nutrition Education Note  RD consulted for nutrition education regarding low sodium diet secondary to acute on chronic dCHF.  RD spoke with pt via phone this afternoon due to RD working remotely. She reports that she is not a big eater at baseline. Recalls eating a scrambled egg biscuit with coffee, bowl of cereal (cheerios, raisin bran, corn flakes) with lactose free 2% milk, or oatmeal for breakfast. She does not eat lunch, recalls grilled chicken salad with Blue cheese dressing, occasionally [employer], or the salad bar for [employer] for dinner. She drinks mostly water and treats herself to a small glass of Pepsi every once in a while. Patient with history of metastatic cholangiocarcinoma, not currently undergoing chemotherapy. She endorses ~30 lb wt loss from usual 154 lbs since June 2020. Per chart, weights have been fairly stable over the past 3 months. RD discussed the importance of adequate calorie/protein intake to maintain healthy weight.   RD provided "Low Sodium Nutrition Therapy" handout from the [employer] of [employer]. Handout has been attached to discharge instruction for pt review. Reviewed patient's dietary recall. Provided examples on ways to decrease sodium intake in diet and discussed strategies to minimize sodium intake when eating out. Discouraged intake of processed foods and use of salt shaker. Encouraged eating smaller meals more frequently, lean protein sources, fresh fruits and vegetables as well as whole grain sources of carbohydrates to maximize fiber intake.   RD discussed why it is important for patient to adhere to diet recommendations, and emphasized the role of fluids, foods to avoid, and importance of weighing self daily. Teach back method used.  Expect good compliance.  Body mass index is 20.7 kg/m. Pt meets criteria for normal based on current BMI.  Current diet order is CM, patient is consuming approximately 100% of  meals at this time. She does have history of DM however, her A1c on 12/28/19 was 4.9 and CBGs 79-178. Recommend changing diet to [facility]. RD spoke with MD via secure chat, okay to change diet. Labs and medications reviewed. No further nutrition interventions warranted at this time. RD contact information provided. If additional nutrition issues arise, please re-consult RD.   Lajuan Lines, RD, LDN Clinical Nutrition After Hours/Weekend Pager # in [place]

## 2020-01-20 NOTE — Progress Notes (Signed)
BP has been elevated since last night.  Currently 182/78 HR 67.  Idolina Primer, RN

## 2020-01-20 NOTE — Progress Notes (Signed)
PROGRESS NOTE    Donna May  WLN:[id number] DOB: October 19, 1940 DOA: 01/18/2020 PCP: [name], MD   Brief Narrative:  Donna May is a 79 y.o. female with medical history significant for chronic diastolic dysfunction CHF (Last known LVEF 60 - 65%), diabetes mellitus with complications of stage III chronic kidney disease, history of cholangiocarcinoma on chemotherapy, hypertension, history of prior right breast cancer who was brought into the emergency room by EMS for evaluation of sudden onset shortness of breath that started about 4 AM this morning.  EMS was called and when they arrived patient had room air pulse oximetry of 70%.  EMS attempted to place patient on a nonrebreather mask but she was unable to tolerate it, she was placed on 3 L of oxygen with improved pulse oximetry to 90%. Upon arrival to the ER she was noted to have blood pressure 230/150, respiratory rate in the 40s and was tachycardic with heart rate of 100 bpm. Patient was placed on noninvasive mechanical ventilation to reduce work of breathing. She denies having any chest pain,, no nausea, no vomiting, no diaphoresis, no abdominal pain, no changes in her bowel habits, no fever, no cough, no dizziness or lightheadedness. She has no urinary symptoms. Labs show sodium 146, potassium 3.7, chloride 110, bicarb 24, glucose 178, BUN 26, creatinine 1.30, calcium 8.8, alkaline phosphatase 81, albumin 3.0, AST 33, ALT 13, total protein 7.7, BNP 1816, white count 9.0, hemoglobin 12.6, hematocrit 37.0, MCV 104, RDW 18.6, platelet count 450, Respiratory viral panel is negative. Chest x-ray shows cardiomegaly with diffuse bilateral pulmonary interstitial edema. Twelve-lead EKG shows sinus rhythm with left atrial enlargement   Assessment & Plan:   Acute hypoxic respiratory failure, multifactorial, POA  Most notably in the setting of heart failure exacerbation and hypertensive emergency  Continue diuresis and supportive care as below    Hypertensive emergency, ongoing Blood pressure markedly uncontrolled at intake, resume home medications Continue to titrate appropriately, blood pressure remains somewhat elevated this morning  Increase hydralazine to 100 mg 3 times daily  Lengthy discussion at bedside about need for close monitoring of blood pressure at home as well as need for medication compliance -patient indicates she takes some of her medications "as needed" at does not take her hydralazine 3 times daily, generally takes in the morning and sometimes at night but never in the middle of the day  Acute on chronic diastolic dysfunction CHF Acute on chronic diastolic dysfunction heart failure exacerbation in the setting of hypertensive emergency as above BNP moderately elevated in the setting of obesity concerning for profound heart failure exacerbation Continue Lasix IV twice daily Blood pressure markedly well controlled now as above on home medications Cardiology following, appreciate insight recommendations Recurrent testing imaging and further diuretic management assistance with cardiology will be appreciated Continue metoprolol, irbesartan and amlodipine  Diabetes mellitus with complications of chronic kidney disease stage IIIB Maintain consistent carbohydrate diet Continue sliding scale insulin, hypoglycemic protocol  Lab Results  Component Value Date   HGBA1C 4.9 12/28/2019   History of cholangiocarcinoma Follow-up with oncology as outpatient; no acute indication for treatment imaging or intervention at this time  DVT prophylaxis: Lovenox Code Status:  DNI -patient has living will with family, she indicates this states she will have "everything done except intubation" -attempting to gain copy from family for documentation Family Communication: None available  Status is: Inpatient  Dispo: The patient is from: Home  Anticipated d/c is to: To be determined              Anticipated d/c date is:  Likely 7 to 72 hours pending clinical course              Patient currently not medically stable for discharge given ongoing need for IV diuretics close monitoring and supportive care in the inpatient setting.  Consultants:   Cardiology  Procedures:   None  Antimicrobials:  None  Subjective: No acute issues or events overnight denies nausea, vomiting, diarrhea, constipation, headache, fevers, chills.  Objective: Vitals:   01/19/20 2344 01/20/20 0501 01/20/20 0545 01/20/20 0732  BP: (!) 161/71 (!) 186/95 (!) 182/83 (!) 182/78  Pulse: 72 64  63  Resp: 16 19    Temp: 98.3 F (36.8 C) 98.3 F (36.8 C)    TempSrc: Oral Oral    SpO2: 97% 96%  93%  Weight:  56.4 kg    Height:        Intake/Output Summary (Last 24 hours) at 01/20/2020 0757 Last data filed at 01/20/2020 0500 Gross per 24 hour  Intake 436 ml  Output 2300 ml  Net -1864 ml   Filed Weights   01/18/20 0538 01/19/20 0535 01/20/20 0501  Weight: 59 kg 58.2 kg 56.4 kg    Examination:  General exam: Appears calm and comfortable  Respiratory system: Clear to auscultation. Respiratory effort normal. Cardiovascular system: S1 & S2 heard, RRR. No JVD, murmurs, rubs, gallops or clicks. No pedal edema. Gastrointestinal system: Abdomen is nondistended, soft and nontender. No organomegaly or masses felt. Normal bowel sounds heard. Central nervous system: Alert and oriented. No focal neurological deficits. Extremities: Symmetric 5 x 5 power. Skin: No rashes, lesions or ulcers Psychiatry: Judgement and insight appear normal. Mood & affect appropriate.     Data Reviewed: I have personally reviewed following labs and imaging studies  CBC: Recent Labs  Lab 01/15/20 0913 01/18/20 0532 01/18/20 0549  WBC 5.7 9.0  --   NEUTROABS 3.9  --   --   HGB 9.0* 12.0 12.6  HCT 28.9* 39.1 37.0  MCV 102.1* 104.0*  --   PLT 345 450*  --    Basic Metabolic Panel: Recent Labs  Lab 01/15/20 0913 01/18/20 0532 01/18/20 0549  01/19/20 0119 01/20/20 0118  NA 142 141 146* 141 139  K 3.9 3.7 3.7 3.8 3.8  CL 111 106 110 108 106  CO2 26 24  --  24 25  GLUCOSE 79 177* 178* 96 83  BUN 27* 26* 26* 29* 27*  CREATININE 1.24* 1.41* 1.30* 1.46* 1.71*  CALCIUM 8.4* 8.8*  --  8.2* 8.4*  MG  --  2.4  --   --   --    GFR: Estimated Creatinine Clearance: 23.4 mL/min (A) (by C-G formula based on SCr of 1.71 mg/dL (H)). Liver Function Tests: Recent Labs  Lab 01/15/20 0913 01/18/20 0532  AST 23 33  ALT 6 13  ALKPHOS 55 81  BILITOT 0.5 0.7  PROT 6.3* 7.7  ALBUMIN 2.7* 3.0*   No results for input(s): LIPASE, AMYLASE in the last 168 hours. No results for input(s): AMMONIA in the last 168 hours. Coagulation Profile: No results for input(s): INR, PROTIME in the last 168 hours. Cardiac Enzymes: No results for input(s): CKTOTAL, CKMB, CKMBINDEX, TROPONINI in the last 168 hours. BNP (last 3 results) No results for input(s): PROBNP in the last 8760 hours. HbA1C: No results for input(s): HGBA1C in the last 72  hours. CBG: Recent Labs  Lab 01/19/20 0819 01/19/20 1128 01/19/20 1614 01/19/20 2043 01/20/20 0752  GLUCAP 101* 87 98 90 72   Lipid Profile: No results for input(s): CHOL, HDL, LDLCALC, TRIG, CHOLHDL, LDLDIRECT in the last 72 hours. Thyroid Function Tests: No results for input(s): TSH, T4TOTAL, FREET4, T3FREE, THYROIDAB in the last 72 hours. Anemia Panel: No results for input(s): VITAMINB12, FOLATE, FERRITIN, TIBC, IRON, RETICCTPCT in the last 72 hours. Sepsis Labs: No results for input(s): PROCALCITON, LATICACIDVEN in the last 168 hours.  Recent Results (from the past 240 hour(s))  Respiratory Panel by RT PCR (Flu A&B, Covid) - Nasopharyngeal Swab     Status: None   Collection Time: 01/18/20  5:34 AM   Specimen: Nasopharyngeal Swab  Result Value Ref Range Status   SARS Coronavirus 2 by RT PCR NEGATIVE NEGATIVE Final    Comment: (NOTE) SARS-CoV-2 target nucleic acids are NOT DETECTED.  The  SARS-CoV-2 RNA is generally detectable in upper respiratoy specimens during the acute phase of infection. The lowest concentration of SARS-CoV-2 viral copies this assay can detect is 131 copies/mL. A negative result does not preclude SARS-Cov-2 infection and should not be used as the sole basis for treatment or other patient management decisions. A negative result may occur with  improper specimen collection/handling, submission of specimen other than nasopharyngeal swab, presence of viral mutation(s) within the areas targeted by this assay, and inadequate number of viral copies (<131 copies/mL). A negative result must be combined with clinical observations, patient history, and epidemiological information. The expected result is Negative.  Fact Sheet for Patients:  PinkCheek.be  Fact Sheet for Healthcare Providers:  GravelBags.it  This test is no t yet approved or cleared by the [place] FDA and  has been authorized for detection and/or diagnosis of SARS-CoV-2 by FDA under an Emergency Use Authorization (EUA). This EUA will remain  in effect (meaning this test can be used) for the duration of the COVID-19 declaration under Section 564(b)(1) of the Act, 21 U.S.C. section 360bbb-3(b)(1), unless the authorization is terminated or revoked sooner.     Influenza A by PCR NEGATIVE NEGATIVE Final   Influenza B by PCR NEGATIVE NEGATIVE Final    Comment: (NOTE) The Xpert Xpress SARS-CoV-2/FLU/RSV assay is intended as an aid in  the diagnosis of influenza from Nasopharyngeal swab specimens and  should not be used as a sole basis for treatment. Nasal washings and  aspirates are unacceptable for Xpert Xpress SARS-CoV-2/FLU/RSV  testing.  Fact Sheet for Patients: PinkCheek.be  Fact Sheet for Healthcare Providers: GravelBags.it  This test is not yet approved or cleared  by the [place] FDA and  has been authorized for detection and/or diagnosis of SARS-CoV-2 by  FDA under an Emergency Use Authorization (EUA). This EUA will remain  in effect (meaning this test can be used) for the duration of the  Covid-19 declaration under Section 564(b)(1) of the Act, 21  U.S.C. section 360bbb-3(b)(1), unless the authorization is  terminated or revoked. Performed at [facility], [place] [address] [place], [place] [address]     Radiology Studies: No results found. Scheduled Meds: . amLODipine  10 mg Oral Daily  . aspirin EC  81 mg Oral Daily  . enoxaparin (LOVENOX) injection  30 mg Subcutaneous Q24H  . furosemide  40 mg Intravenous BID  . hydrALAZINE  50 mg Oral Q8H  . insulin aspart  0-9 Units Subcutaneous TID WC  . irbesartan  300 mg Oral Daily  . metoprolol  succinate  100 mg Oral Daily  . omega-3 acid ethyl esters  2 g Oral Daily  . pantoprazole  40 mg Oral Daily  . potassium chloride  10 mEq Oral Daily  . sodium chloride flush  3 mL Intravenous Q12H   Continuous Infusions: . sodium chloride       LOS: 2 days   Time spent: 68min  Oluwadarasimi Favor C Jenniferann Stuckert, DO [facility]  If 7PM-7AM, please contact night-coverage [email or web address]  01/20/2020, 7:57 AM

## 2020-01-20 NOTE — Progress Notes (Signed)
Pt ambulated about 200 ft on a hallway with walker without any problem other than some c/o neuropathy in hands and feet.  Idolina Primer, RN

## 2020-01-20 NOTE — Progress Notes (Signed)
Progress Note  Patient Name: Donna May Date of Encounter: 01/20/2020  [facility] Cardiologist: Sanda Klein, MD   Subjective   Patient feels well.  Eager to go home.  Inpatient Medications    Scheduled Meds:  amLODipine  10 mg Oral Daily   aspirin EC  81 mg Oral Daily   enoxaparin (LOVENOX) injection  30 mg Subcutaneous Q24H   furosemide  40 mg Intravenous BID   hydrALAZINE  50 mg Oral Q8H   insulin aspart  0-9 Units Subcutaneous TID WC   irbesartan  300 mg Oral Daily   metoprolol succinate  100 mg Oral Daily   omega-3 acid ethyl esters  2 g Oral Daily   pantoprazole  40 mg Oral Daily   potassium chloride  10 mEq Oral Daily   sodium chloride flush  3 mL Intravenous Q12H   Continuous Infusions:  sodium chloride     PRN Meds: sodium chloride, acetaminophen, loratadine, nitroGLYCERIN, ondansetron (ZOFRAN) IV, sodium chloride flush   Vital Signs    Vitals:   01/20/20 0501 01/20/20 0545 01/20/20 0732 01/20/20 1001  BP: (!) 186/95 (!) 182/83 (!) 182/78 (!) 149/62  Pulse: 64  63 69  Resp: 19     Temp: 98.3 F (36.8 C)     TempSrc: Oral     SpO2: 96%  93%   Weight: 56.4 kg     Height:        Intake/Output Summary (Last 24 hours) at 01/20/2020 1037 Last data filed at 01/20/2020 1002 Gross per 24 hour  Intake 133 ml  Output 2300 ml  Net -2167 ml   Last 3 Weights 01/20/2020 01/19/2020 01/18/2020  Weight (lbs) 124 lb 6.4 oz 128 lb 3.2 oz 130 lb  Weight (kg) 56.427 kg 58.151 kg 58.968 kg      Telemetry    NSR - Personally Reviewed  ECG    N/A  Physical Exam   GEN: No acute distress.   Neck: + JVD Cardiac: RRR, no murmurs, rubs, or gallops.  Respiratory: faint rales GI: Soft, nontender, non-distended  MS: Bilateral ankle edema; No deformity. Neuro:  Nonfocal  Psych: Normal affect   Labs    High Sensitivity Troponin:   Recent Labs  Lab 12/28/19 1738 12/28/19 2021 01/18/20 0833 01/18/20 1735  TROPONINIHS 28* 27* 53* 45*       Chemistry Recent Labs  Lab 01/15/20 0913 01/15/20 0913 01/18/20 0532 01/18/20 0532 01/18/20 0549 01/19/20 0119 01/20/20 0118  NA 142   < > 141   < > 146* 141 139  K 3.9   < > 3.7   < > 3.7 3.8 3.8  CL 111   < > 106   < > 110 108 106  CO2 26   < > 24  --   --  24 25  GLUCOSE 79   < > 177*   < > 178* 96 83  BUN 27*   < > 26*   < > 26* 29* 27*  CREATININE 1.24*  --  1.41*   < > 1.30* 1.46* 1.71*  CALCIUM 8.4*   < > 8.8*  --   --  8.2* 8.4*  PROT 6.3*  --  7.7  --   --   --   --   ALBUMIN 2.7*  --  3.0*  --   --   --   --   AST 23  --  33  --   --   --   --  ALT 6  --  13  --   --   --   --   ALKPHOS 55  --  81  --   --   --   --   BILITOT 0.5  --  0.7  --   --   --   --   GFRNONAA 44*  --  38*  --   --  36* 30*  ANIONGAP 5   < > 11  --   --  9 8   < > = values in this interval not displayed.     Hematology Recent Labs  Lab 01/15/20 0913 01/18/20 0532 01/18/20 0549  WBC 5.7 9.0  --   RBC 2.83* 3.76*  --   HGB 9.0* 12.0 12.6  HCT 28.9* 39.1 37.0  MCV 102.1* 104.0*  --   MCH 31.8 31.9  --   MCHC 31.1 30.7  --   RDW 18.5* 18.6*  --   PLT 345 450*  --     BNP Recent Labs  Lab 01/18/20 0532  BNP 1,816.9*    Radiology    No results found.  Cardiac Studies   Echo 01/03/20 1. Left ventricular ejection fraction, by estimation, is 60 to 65%. The  left ventricle has normal function. The left ventricle has no regional  wall motion abnormalities. There is moderate concentric left ventricular  hypertrophy. Left ventricular  diastolic parameters are consistent with Grade III diastolic dysfunction  (restrictive).   2. Right ventricular systolic function is normal. The right ventricular  size is normal. There is severely elevated pulmonary artery systolic  pressure.   3. Left atrial size was severely dilated.   4. Right atrial size was moderately dilated.   5. A small pericardial effusion is present. The pericardial effusion is  circumferential.   6. The  mitral valve is normal in structure. Mild mitral valve  regurgitation.   7. Tricuspid valve regurgitation is moderate.   8. The aortic valve is tricuspid. Aortic valve regurgitation is not  visualized. No aortic stenosis is present.   9. The inferior vena cava is dilated in size with >50% respiratory  variability, suggesting right atrial pressure of 8 mmHg.  Patient Profile     79 y.o. female  with a hx of HTN, PAD (bilateral non-obstructive renal artery stenosis), HLD, DM2, breast cancer, remote tobacco use, cholangiocarcinoma with mets to the peritoneum, CKD Stage III and neuropathy seen for CHF.  Assessment & Plan    Acute on chronic diastolic CHF/Acute hypoxic respiratory failure Hypertensive urgency CKD III - Dry weight around 125lb; patient presently at dry weight - no further diuresis today - lasix 40 mg daily tomorrow - continue norvasc - increasing hydralazine to 100 mg TID - if needing additional medications will transition succinate to labetalol  For questions or updates, please contact [facility] Please consult [email or web address] for contact info under        Signed, Werner Lean, MD  01/20/2020, 10:37 AM

## 2020-01-21 DIAGNOSIS — J9600 Acute respiratory failure, unspecified whether with hypoxia or hypercapnia: Secondary | ICD-10-CM | POA: Diagnosis not present

## 2020-01-21 DIAGNOSIS — I160 Hypertensive urgency: Secondary | ICD-10-CM | POA: Diagnosis not present

## 2020-01-21 DIAGNOSIS — I5033 Acute on chronic diastolic (congestive) heart failure: Secondary | ICD-10-CM | POA: Diagnosis not present

## 2020-01-21 DIAGNOSIS — N1832 Chronic kidney disease, stage 3b: Secondary | ICD-10-CM | POA: Diagnosis not present

## 2020-01-21 LAB — GLUCOSE, CAPILLARY
Glucose-Capillary: 78 mg/dL (ref 70–99)
Glucose-Capillary: 123 mg/dL — ABNORMAL HIGH (ref 70–99)
Glucose-Capillary: 75 mg/dL (ref 70–99)
Glucose-Capillary: 115 mg/dL — ABNORMAL HIGH (ref 70–99)

## 2020-01-21 LAB — BASIC METABOLIC PANEL
Anion gap: 9 (ref 5–15)
BUN: 31 mg/dL — ABNORMAL HIGH (ref 8–23)
CO2: 27 mmol/L (ref 22–32)
Calcium: 8.4 mg/dL — ABNORMAL LOW (ref 8.9–10.3)
Chloride: 105 mmol/L (ref 98–111)
Creatinine, Ser: 1.52 mg/dL — ABNORMAL HIGH (ref 0.44–1.00)
GFR, Estimated: 34 mL/min — ABNORMAL LOW (ref >60)
Glucose, Bld: 109 mg/dL — ABNORMAL HIGH (ref 70–99)
Potassium: 3.9 mmol/L (ref 3.5–5.1)
Sodium: 141 mmol/L (ref 135–145)

## 2020-01-21 MED ORDER — LABETALOL HCL 200 MG PO TABS
200.0000 mg | ORAL_TABLET | Freq: Two times a day (BID) | ORAL | Status: DC
  Administered 2020-01-22: 200 mg via ORAL
  Filled 2020-01-21: qty 1

## 2020-01-21 NOTE — Evaluation (Signed)
Occupational Therapy Evaluation Patient Details Name: Donna May MRN: [id number] DOB: 06/30/1940 Today's Date: 01/21/2020    History of Present Illness 79 y.o. female  with a hx of HTN, PAD (bilateral non-obstructive renal artery stenosis), HLD, DM2, breast cancer, remote tobacco use, cholangiocarcinoma with mets to the peritoneum, CKD Stage III and neuropathy admitted for acute on chronic CHF.    Clinical Impression   Pt PTA: Pt living alone and reports independence with ADL, mobility with SPC or RW and driving. Pt currently performing bed mobility and ADL functional mobility in room with no AD and fair balance. Pt with no focal deficits for LB dressing at EOB. Pt standing at sink for ADL tasks with fair balance. Pt education heavily on energy conservation. Pt has appropriate DME at home and plans to buy a LH shoe horn. O2 sats >90% on RA; HR 90s with exertion. Pt appears at her functional level for ADL and ADL functional mobility so no further acute OT skilled services required. OT signing off.      Follow Up Recommendations  No OT follow up    Equipment Recommendations  Other (comment) (LH shoe horn)    Recommendations for Other Services       Precautions / Restrictions Precautions Precautions: Fall Restrictions Weight Bearing Restrictions: No      Mobility Bed Mobility Overal bed mobility: Modified Independent                  Transfers Overall transfer level: Modified independent Equipment used: Rolling walker (2 wheeled) Transfers: Sit to/from Stand Sit to Stand: Modified independent (Device/Increase time)              Balance Overall balance assessment: No apparent balance deficits (not formally assessed)         Standing balance support: No upper extremity supported Standing balance-Leahy Scale: Fair                             ADL either performed or assessed with clinical judgement   ADL Overall ADL's : Modified independent;At  baseline                                     Functional mobility during ADLs: Modified independent General ADL Comments: Pt performing bed mobility and ADL functional mobility in room with no AD and fair balance. Pt with no focal deficits for LB dressing at EOB. Pt standing at sink for ADL tasks with fair balance. Pt education heavily on energy conservation. Pt has appropriate DME at home and plans to buy a LH shoe horn.     Vision Baseline Vision/History: Wears glasses Wears Glasses: Reading only Patient Visual Report: No change from baseline Vision Assessment?: No apparent visual deficits Additional Comments: "I had cataract surgery"     Perception     Praxis      Pertinent Vitals/Pain Pain Assessment: No/denies pain     Hand Dominance Right   Extremity/Trunk Assessment Upper Extremity Assessment Upper Extremity Assessment: RUE deficits/detail;LUE deficits/detail RUE Sensation: history of peripheral neuropathy LUE Sensation: history of peripheral neuropathy   Lower Extremity Assessment Lower Extremity Assessment: Defer to PT evaluation RLE Sensation: history of peripheral neuropathy LLE Sensation: history of peripheral neuropathy   Cervical / Trunk Assessment Cervical / Trunk Assessment: Normal   Communication Communication Communication: No difficulties   Cognition Arousal/Alertness: Awake/alert Behavior During  Therapy: WFL for tasks assessed/performed Overall Cognitive Status: Within Functional Limits for tasks assessed                                     General Comments  O2 sats >90% on RA; HR 90s with exertion.    Exercises     Shoulder Instructions      Home Living Family/patient expects to be discharged to:: Private residence Living Arrangements: Alone Available Help at Discharge: Neighbor;Available PRN/intermittently Type of Home: House Home Access: Stairs to enter [employer] of Steps: 3 Entrance  Stairs-Rails: Right;Left;Can reach both Home Layout: One level     Bathroom Shower/Tub: Teacher, early years/pre: Handicapped height Bathroom Accessibility: Yes   Home Equipment: Cane - single point;Walker - 2 wheels;Hand held shower head;Tub bench;Adaptive equipment Adaptive Equipment: Reacher;Long-handled sponge        Prior Functioning/Environment Level of Independence: Independent with assistive device(s)        Comments: Uses SPC as needed. No falls. Drives. Family assists with transportation to cancer treatments/infusions. Does IADLs, eats out        OT Problem List:        OT Treatment/Interventions:      OT Goals(Current goals can be found in the care plan section) Acute Rehab OT Goals Patient Stated Goal: to go home  OT Frequency:     Barriers to D/C:            Co-evaluation              AM-PAC OT "6 Clicks" Daily Activity     Outcome Measure Help from another person eating meals?: None Help from another person taking care of personal grooming?: None Help from another person toileting, which includes using toliet, bedpan, or urinal?: None Help from another person bathing (including washing, rinsing, drying)?: None Help from another person to put on and taking off regular upper body clothing?: None Help from another person to put on and taking off regular lower body clothing?: A Little 6 Click Score: 23   End of Session Nurse Communication: Mobility status  Activity Tolerance: Patient tolerated treatment well Patient left: in bed;with call bell/phone within reach  OT Visit Diagnosis: Unsteadiness on feet (R26.81)                Time: 3536-1443 OT Time Calculation (min): 25 min Charges:  OT General Charges $OT Visit: 1 Visit OT Evaluation $OT Eval Moderate Complexity: 1 Mod OT Treatments $Self Care/Home Management : 8-22 mins Jefferey Pica, OTR/L Acute Rehabilitation Services Pager: [phone] Office: [phone]   Huel Centola  C 01/21/2020, 1:58 PM

## 2020-01-21 NOTE — Progress Notes (Signed)
Progress Note  Patient Name: Donna May Date of Encounter: 01/21/2020  [facility] Cardiologist: Sanda Klein, MD   Subjective   No SOB.  Notes her lymphedema (right arm) and neuropathy in both legs have lead to her only getting BP measurements in her left arm  Inpatient Medications    Scheduled Meds:  amLODipine  10 mg Oral Daily   aspirin EC  81 mg Oral Daily   enoxaparin (LOVENOX) injection  30 mg Subcutaneous Q24H   furosemide  40 mg Oral Daily   hydrALAZINE  100 mg Oral Q8H   insulin aspart  0-9 Units Subcutaneous TID WC   irbesartan  300 mg Oral Daily   metoprolol succinate  100 mg Oral Daily   omega-3 acid ethyl esters  2 g Oral Daily   pantoprazole  40 mg Oral Daily   potassium chloride  10 mEq Oral Daily   sodium chloride flush  3 mL Intravenous Q12H   Continuous Infusions:  sodium chloride     PRN Meds: sodium chloride, acetaminophen, loratadine, nitroGLYCERIN, ondansetron (ZOFRAN) IV, sodium chloride flush   Vital Signs    Vitals:   01/21/20 0646 01/21/20 0652 01/21/20 0859 01/21/20 0900  BP: (!) 188/79 (!) 191/85 (!) 181/82 (!) 181/82  Pulse: 61  65 69  Resp: 19     Temp: 98.2 F (36.8 C)     TempSrc: Oral     SpO2: 99%   98%  Weight:      Height:        Intake/Output Summary (Last 24 hours) at 01/21/2020 1117 Last data filed at 01/21/2020 0916 Gross per 24 hour  Intake 486 ml  Output 1120 ml  Net -634 ml   Last 3 Weights 01/20/2020 01/19/2020 01/18/2020  Weight (lbs) 124 lb 6.4 oz 128 lb 3.2 oz 130 lb  Weight (kg) 56.427 kg 58.151 kg 58.968 kg      Telemetry    Sinus Brady to Sinus Rhythm - Personally Reviewed  ECG    N/A  Physical Exam   GEN: No acute distress.   Neck: no JVD Cardiac: RRR, no murmurs, rubs, or gallops.  Respiratory: Poor air movement; CTAB GI: Soft, nontender, non-distended  MS: Bilateral ankle edema; No deformity. Neuro:  Nonfocal  Psych: Normal affect   Labs    High Sensitivity Troponin:   Recent  Labs  Lab 12/28/19 1738 12/28/19 2021 01/18/20 0833 01/18/20 1735  TROPONINIHS 28* 27* 53* 45*      Chemistry Recent Labs  Lab 01/15/20 0913 01/15/20 0913 01/18/20 0532 01/18/20 0549 01/19/20 0119 01/20/20 0118 01/21/20 0406  NA 142   < > 141   < > 141 139 141  K 3.9   < > 3.7   < > 3.8 3.8 3.9  CL 111   < > 106   < > 108 106 105  CO2 26   < > 24   < > 24 25 27   GLUCOSE 79   < > 177*   < > 96 83 109*  BUN 27*   < > 26*   < > 29* 27* 31*  CREATININE 1.24*  --  1.41*   < > 1.46* 1.71* 1.52*  CALCIUM 8.4*   < > 8.8*   < > 8.2* 8.4* 8.4*  PROT 6.3*  --  7.7  --   --   --   --   ALBUMIN 2.7*  --  3.0*  --   --   --   --  AST 23  --  33  --   --   --   --   ALT 6  --  13  --   --   --   --   ALKPHOS 55  --  81  --   --   --   --   BILITOT 0.5  --  0.7  --   --   --   --   GFRNONAA 44*  --  38*   < > 36* 30* 34*  ANIONGAP 5   < > 11   < > 9 8 9    < > = values in this interval not displayed.     Hematology Recent Labs  Lab 01/15/20 0913 01/18/20 0532 01/18/20 0549  WBC 5.7 9.0  --   RBC 2.83* 3.76*  --   HGB 9.0* 12.0 12.6  HCT 28.9* 39.1 37.0  MCV 102.1* 104.0*  --   MCH 31.8 31.9  --   MCHC 31.1 30.7  --   RDW 18.5* 18.6*  --   PLT 345 450*  --     BNP Recent Labs  Lab 01/18/20 0532  BNP 1,816.9*    Radiology    No results found.  Cardiac Studies   Echo 01/03/20 1. Left ventricular ejection fraction, by estimation, is 60 to 65%. The  left ventricle has normal function. The left ventricle has no regional  wall motion abnormalities. There is moderate concentric left ventricular  hypertrophy. Left ventricular  diastolic parameters are consistent with Grade III diastolic dysfunction  (restrictive).   2. Right ventricular systolic function is normal. The right ventricular  size is normal. There is severely elevated pulmonary artery systolic  pressure.   3. Left atrial size was severely dilated.   4. Right atrial size was moderately dilated.   5. A  small pericardial effusion is present. The pericardial effusion is  circumferential.   6. The mitral valve is normal in structure. Mild mitral valve  regurgitation.   7. Tricuspid valve regurgitation is moderate.   8. The aortic valve is tricuspid. Aortic valve regurgitation is not  visualized. No aortic stenosis is present.   9. The inferior vena cava is dilated in size with >50% respiratory  variability, suggesting right atrial pressure of 8 mmHg.  Patient Profile     79 y.o. female  with a hx of HTN, PAD (bilateral non-obstructive renal artery stenosis), HLD, DM2, breast cancer, remote tobacco use, cholangiocarcinoma with mets to the peritoneum, CKD Stage III and neuropathy seen for CHF.  Assessment & Plan    Acute on chronic diastolic CHF/Acute hypoxic respiratory failure Hypertensive urgency CKD III - Dry weight around 125lb; patient presently at dry weight - no further diuresis today - lasix 40 mg daily - continue norvasc - increasied hydralazine to 100 mg TID - No hx of reduced EF; stopping succinate and starting BID labetalol - As outpatient, could consider eval of L subclavian stenosis or secondary causes of HTN  For questions or updates, please contact [facility] Please consult [email or web address] for contact info under        Signed, Werner Lean, MD  01/21/2020, 11:17 AM

## 2020-01-21 NOTE — Evaluation (Signed)
Physical Therapy Evaluation Patient Details Name: Donna May MRN: [id number] DOB: [birth year] Today's Date: 01/21/2020   History of Present Illness  79 y.o. female  with a hx of HTN, PAD (bilateral non-obstructive renal artery stenosis), HLD, DM2, breast cancer, remote tobacco use, cholangiocarcinoma with mets to the peritoneum, CKD Stage III and neuropathy admitted for acute on chronic CHF.   Clinical Impression  Prior to admission, pt lives alone, uses a cane for mobility, and is independent with ADL's/IADL's. Pt likely fairly close to her functional baseline and reports improvement. Ambulating 400 feet with a walker at a supervision level, SpO2 97-100% on RA. Displays mildly decreased cardiopulmonary endurance and decreased gait speed. Recommend HHPT to address deficits and maximize functional independence.    Follow Up Recommendations Home health PT;Supervision - Intermittent    Equipment Recommendations  None recommended by PT    Recommendations for Other Services       Precautions / Restrictions Precautions Precautions: Fall Restrictions Weight Bearing Restrictions: No      Mobility  Bed Mobility Overal bed mobility: Modified Independent                  Transfers Overall transfer level: Modified independent Equipment used: Rolling walker (2 wheeled)                Ambulation/Gait Ambulation/Gait assistance: Supervision Gait Distance (Feet): 400 Feet Assistive device: Rolling walker (2 wheeled) Gait Pattern/deviations: Step-through pattern;Decreased stride length Gait velocity: decreased   General Gait Details: Cues for walker proximity, supervision for safety, light reliance on walker for balance  Stairs            Wheelchair Mobility    Modified Rankin (Stroke Patients Only)       Balance Overall balance assessment: Mild deficits observed, not formally tested                                           Pertinent  Vitals/Pain Pain Assessment: No/denies pain    Home Living Family/patient expects to be discharged to:: Private residence Living Arrangements: Alone Available Help at Discharge: Neighbor;Available PRN/intermittently Type of Home: House Home Access: Stairs to enter Entrance Stairs-Rails: Right;Left;Can reach both Entrance Stairs-Number of Steps: 3 Home Layout: One level Home Equipment: Cane - single point;Walker - 2 wheels      Prior Function Level of Independence: Independent with assistive device(s)         Comments: Uses SPC as needed. No falls. Drives. Does IADLs, eats out     Hand Dominance   Dominant Hand: Right    Extremity/Trunk Assessment   Upper Extremity Assessment Upper Extremity Assessment: RUE deficits/detail;LUE deficits/detail RUE Sensation: history of peripheral neuropathy LUE Sensation: history of peripheral neuropathy    Lower Extremity Assessment Lower Extremity Assessment: RLE deficits/detail;LLE deficits/detail RLE Sensation: history of peripheral neuropathy LLE Sensation: history of peripheral neuropathy       Communication   Communication: No difficulties  Cognition Arousal/Alertness: Awake/alert Behavior During Therapy: WFL for tasks assessed/performed Overall Cognitive Status: Within Functional Limits for tasks assessed                                        General Comments      Exercises     Assessment/Plan    PT Assessment Patient  needs continued PT services  PT Problem List Decreased strength;Decreased mobility;Decreased safety awareness;Decreased cognition;Pain;Impaired sensation;Decreased balance;Decreased knowledge of use of DME;Decreased activity tolerance       PT Treatment Interventions Therapeutic activities;Gait training;Therapeutic exercise;Patient/family education;Balance training;Functional mobility training;Stair training;DME instruction    PT Goals (Current goals can be found in the Care Plan  section)  Acute Rehab PT Goals Patient Stated Goal: to go home PT Goal Formulation: With patient Time For Goal Achievement: 02/04/20 Potential to Achieve Goals: Good    Frequency Min 3X/week   Barriers to discharge Decreased caregiver support      Co-evaluation               AM-PAC PT "6 Clicks" Mobility  Outcome Measure Help needed turning from your back to your side while in a flat bed without using bedrails?: None Help needed moving from lying on your back to sitting on the side of a flat bed without using bedrails?: None Help needed moving to and from a bed to a chair (including a wheelchair)?: None Help needed standing up from a chair using your arms (e.g., wheelchair or bedside chair)?: None Help needed to walk in hospital room?: None Help needed climbing 3-5 steps with a railing? : A Little 6 Click Score: 23    End of Session   Activity Tolerance: Patient tolerated treatment well Patient left: in bed;with call bell/phone within reach Nurse Communication: Mobility status PT Visit Diagnosis: Muscle weakness (generalized) (M62.81);Unsteadiness on feet (R26.81)    Time: 2563-8937 PT Time Calculation (min) (ACUTE ONLY): 27 min   Charges:   PT Evaluation $PT Eval Moderate Complexity: 1 Mod PT Treatments $Gait Training: 8-22 mins        Wyona Almas, PT, DPT Acute Rehabilitation Services Pager [phone] Office [phone]   Deno Etienne 01/21/2020, 1:01 PM

## 2020-01-21 NOTE — Progress Notes (Addendum)
PROGRESS NOTE    Donna May  KGM:[id number] DOB: 1940/04/03 DOA: 01/18/2020 PCP: [name], MD   Brief Narrative:  Donna May is a 79 y.o. female with medical history significant for chronic diastolic dysfunction CHF (Last known LVEF 60 - 65%), diabetes mellitus with complications of stage III chronic kidney disease, history of cholangiocarcinoma on chemotherapy, hypertension, history of prior right breast cancer who was brought into the emergency room by EMS for evaluation of sudden onset shortness of breath that started about 4 AM this morning.  EMS was called and when they arrived patient had room air pulse oximetry of 70%.  EMS attempted to place patient on a nonrebreather mask but she was unable to tolerate it, she was placed on 3 L of oxygen with improved pulse oximetry to 90%. Upon arrival to the ER she was noted to have blood pressure 230/150, respiratory rate in the 40s and was tachycardic with heart rate of 100 bpm. Patient was placed on noninvasive mechanical ventilation to reduce work of breathing. She denies having any chest pain,, no nausea, no vomiting, no diaphoresis, no abdominal pain, no changes in her bowel habits, no fever, no cough, no dizziness or lightheadedness. She has no urinary symptoms. Labs show sodium 146, potassium 3.7, chloride 110, bicarb 24, glucose 178, BUN 26, creatinine 1.30, calcium 8.8, alkaline phosphatase 81, albumin 3.0, AST 33, ALT 13, total protein 7.7, BNP 1816, white count 9.0, hemoglobin 12.6, hematocrit 37.0, MCV 104, RDW 18.6, platelet count 450, Respiratory viral panel is negative. Chest x-ray shows cardiomegaly with diffuse bilateral pulmonary interstitial edema. Twelve-lead EKG shows sinus rhythm with left atrial enlargement  Assessment & Plan:  Acute hypoxic respiratory failure, multifactorial, POA  Most notably in the setting of heart failure exacerbation and hypertensive emergency  Continue diuresis and supportive care as below    Hypertensive emergency, minimally improving Blood pressure markedly uncontrolled at intake Continue to titrate appropriately, blood pressure remains elevated this morning - SBP 190s(improved from 200s)  Increase hydralazine to 100 mg 3 times daily  Transition from metoprolol to labetalol Lengthy discussion at bedside about need for close monitoring of blood pressure at home as well as need for medication compliance -patient indicates she takes some of her medications "as needed" at does not take her hydralazine 3 times daily, generally takes in the morning and sometimes at night but never in the middle of the day  Acute on chronic diastolic dysfunction CHF Acute on chronic diastolic dysfunction heart failure exacerbation in the setting of hypertensive emergency as above BNP moderately elevated in the setting of obesity concerning for profound heart failure exacerbation Continue Lasix at 40 PO daily Blood pressure improving but still requiring adjustments as above Cardiology following, appreciate insight recommendations Recurrent testing imaging and further diuretic management assistance with cardiology will be appreciated Continue labetalol, irbesartan, amlodipine, and hydralazine  Diabetes mellitus with complications of chronic kidney disease stage IIIB Maintain consistent carbohydrate diet Continue sliding scale insulin, hypoglycemic protocol  Lab Results  Component Value Date   HGBA1C 4.9 12/28/2019   History of cholangiocarcinoma Follow-up with oncology as outpatient; no acute indication for treatment imaging or intervention at this time  DVT prophylaxis: Lovenox Code Status:  DNI - patient has living will with family, she indicates this states she will have "everything done except intubation" - attempting to obtain copy from family for documentation Family Communication: None available  Status is: Inpatient  Dispo: The patient is from: Home  Anticipated d/c is  to: HHPT               Anticipated d/c date is: Likely 48 to 72 hours pending clinical course              Patient currently not medically stable for discharge given ongoing need for IV diuretics close monitoring and supportive care in the inpatient setting.  Consultants:   Cardiology  Procedures:   None  Antimicrobials:  None  Subjective: No acute issues or events overnight denies nausea, vomiting, diarrhea, constipation, headache, fevers, chills.  Objective: Vitals:   01/20/20 2027 01/20/20 2233 01/21/20 0646 01/21/20 0652  BP: (!) 144/59 (!) 158/75 (!) 188/79 (!) 191/85  Pulse: 72  61   Resp: 17  19   Temp: 98.9 F (37.2 C)  98.2 F (36.8 C)   TempSrc: Oral  Oral   SpO2: 99%  99%   Weight:      Height:        Intake/Output Summary (Last 24 hours) at 01/21/2020 0809 Last data filed at 01/20/2020 2234 Gross per 24 hour  Intake 486 ml  Output 800 ml  Net -314 ml   Filed Weights   01/18/20 0538 01/19/20 0535 01/20/20 0501  Weight: 59 kg 58.2 kg 56.4 kg    Examination:  General exam: Appears calm and comfortable  Respiratory system: Clear to auscultation. Respiratory effort normal. Cardiovascular system: S1 & S2 heard, RRR. No JVD, murmurs, rubs, gallops or clicks. No pedal edema. Gastrointestinal system: Abdomen is nondistended, soft and nontender. No organomegaly or masses felt. Normal bowel sounds heard. Central nervous system: Alert and oriented. No focal neurological deficits. Extremities: Symmetric 5 x 5 power. Skin: No rashes, lesions or ulcers Psychiatry: Judgement and insight appear normal. Mood & affect appropriate.   Data Reviewed: I have personally reviewed following labs and imaging studies  CBC: Recent Labs  Lab 01/15/20 0913 01/18/20 0532 01/18/20 0549  WBC 5.7 9.0  --   NEUTROABS 3.9  --   --   HGB 9.0* 12.0 12.6  HCT 28.9* 39.1 37.0  MCV 102.1* 104.0*  --   PLT 345 450*  --    Basic Metabolic Panel: Recent Labs  Lab 01/15/20 0913  01/15/20 0913 01/18/20 0532 01/18/20 0549 01/19/20 0119 01/20/20 0118 01/21/20 0406  NA 142   < > 141 146* 141 139 141  K 3.9   < > 3.7 3.7 3.8 3.8 3.9  CL 111   < > 106 110 108 106 105  CO2 26  --  24  --  24 25 27   GLUCOSE 79   < > 177* 178* 96 83 109*  BUN 27*   < > 26* 26* 29* 27* 31*  CREATININE 1.24*  --  1.41* 1.30* 1.46* 1.71* 1.52*  CALCIUM 8.4*  --  8.8*  --  8.2* 8.4* 8.4*  MG  --   --  2.4  --   --   --   --    < > = values in this interval not displayed.   GFR: Estimated Creatinine Clearance: 26.3 mL/min (A) (by C-G formula based on SCr of 1.52 mg/dL (H)). Liver Function Tests: Recent Labs  Lab 01/15/20 0913 01/18/20 0532  AST 23 33  ALT 6 13  ALKPHOS 55 81  BILITOT 0.5 0.7  PROT 6.3* 7.7  ALBUMIN 2.7* 3.0*   No results for input(s): LIPASE, AMYLASE in the last 168 hours. No results for input(s): AMMONIA in the last  168 hours. Coagulation Profile: No results for input(s): INR, PROTIME in the last 168 hours. Cardiac Enzymes: No results for input(s): CKTOTAL, CKMB, CKMBINDEX, TROPONINI in the last 168 hours. BNP (last 3 results) No results for input(s): PROBNP in the last 8760 hours. HbA1C: No results for input(s): HGBA1C in the last 72 hours. CBG: Recent Labs  Lab 01/20/20 0752 01/20/20 1114 01/20/20 1634 01/20/20 2118 01/21/20 0728  GLUCAP 72 103* 82 130* 78   Lipid Profile: No results for input(s): CHOL, HDL, LDLCALC, TRIG, CHOLHDL, LDLDIRECT in the last 72 hours. Thyroid Function Tests: No results for input(s): TSH, T4TOTAL, FREET4, T3FREE, THYROIDAB in the last 72 hours. Anemia Panel: No results for input(s): VITAMINB12, FOLATE, FERRITIN, TIBC, IRON, RETICCTPCT in the last 72 hours. Sepsis Labs: No results for input(s): PROCALCITON, LATICACIDVEN in the last 168 hours.  Recent Results (from the past 240 hour(s))  Respiratory Panel by RT PCR (Flu A&B, Covid) - Nasopharyngeal Swab     Status: None   Collection Time: 01/18/20  5:34 AM    Specimen: Nasopharyngeal Swab  Result Value Ref Range Status   SARS Coronavirus 2 by RT PCR NEGATIVE NEGATIVE Final    Comment: (NOTE) SARS-CoV-2 target nucleic acids are NOT DETECTED.  The SARS-CoV-2 RNA is generally detectable in upper respiratoy specimens during the acute phase of infection. The lowest concentration of SARS-CoV-2 viral copies this assay can detect is 131 copies/mL. A negative result does not preclude SARS-Cov-2 infection and should not be used as the sole basis for treatment or other patient management decisions. A negative result may occur with  improper specimen collection/handling, submission of specimen other than nasopharyngeal swab, presence of viral mutation(s) within the areas targeted by this assay, and inadequate number of viral copies (<131 copies/mL). A negative result must be combined with clinical observations, patient history, and epidemiological information. The expected result is Negative.  Fact Sheet for Patients:  PinkCheek.be  Fact Sheet for Healthcare Providers:  GravelBags.it  This test is no t yet approved or cleared by the [place] FDA and  has been authorized for detection and/or diagnosis of SARS-CoV-2 by FDA under an Emergency Use Authorization (EUA). This EUA will remain  in effect (meaning this test can be used) for the duration of the COVID-19 declaration under Section 564(b)(1) of the Act, 21 U.S.C. section 360bbb-3(b)(1), unless the authorization is terminated or revoked sooner.     Influenza A by PCR NEGATIVE NEGATIVE Final   Influenza B by PCR NEGATIVE NEGATIVE Final    Comment: (NOTE) The Xpert Xpress SARS-CoV-2/FLU/RSV assay is intended as an aid in  the diagnosis of influenza from Nasopharyngeal swab specimens and  should not be used as a sole basis for treatment. Nasal washings and  aspirates are unacceptable for Xpert Xpress SARS-CoV-2/FLU/RSV   testing.  Fact Sheet for Patients: PinkCheek.be  Fact Sheet for Healthcare Providers: GravelBags.it  This test is not yet approved or cleared by the [place] FDA and  has been authorized for detection and/or diagnosis of SARS-CoV-2 by  FDA under an Emergency Use Authorization (EUA). This EUA will remain  in effect (meaning this test can be used) for the duration of the  Covid-19 declaration under Section 564(b)(1) of the Act, 21  U.S.C. section 360bbb-3(b)(1), unless the authorization is  terminated or revoked. Performed at [facility], [place] [address] [place], [place] [address]     Radiology Studies: No results found. Scheduled Meds: . amLODipine  10 mg Oral Daily  .  aspirin EC  81 mg Oral Daily  . enoxaparin (LOVENOX) injection  30 mg Subcutaneous Q24H  . furosemide  40 mg Oral Daily  . hydrALAZINE  100 mg Oral Q8H  . insulin aspart  0-9 Units Subcutaneous TID WC  . irbesartan  300 mg Oral Daily  . metoprolol succinate  100 mg Oral Daily  . omega-3 acid ethyl esters  2 g Oral Daily  . pantoprazole  40 mg Oral Daily  . potassium chloride  10 mEq Oral Daily  . sodium chloride flush  3 mL Intravenous Q12H   Continuous Infusions: . sodium chloride       LOS: 3 days   Time spent: 83min  Traeh Milroy C Joeseph [place], DO [facility]  If 7PM-7AM, please contact night-coverage [email or web address]  01/21/2020, 8:09 AM

## 2020-01-22 ENCOUNTER — Encounter (HOSPITAL_COMMUNITY): Payer: Self-pay | Admitting: Internal Medicine

## 2020-01-22 DIAGNOSIS — I5033 Acute on chronic diastolic (congestive) heart failure: Secondary | ICD-10-CM | POA: Diagnosis not present

## 2020-01-22 LAB — BASIC METABOLIC PANEL
Anion gap: 8 (ref 5–15)
BUN: 35 mg/dL — ABNORMAL HIGH (ref 8–23)
CO2: 26 mmol/L (ref 22–32)
Calcium: 8.5 mg/dL — ABNORMAL LOW (ref 8.9–10.3)
Chloride: 106 mmol/L (ref 98–111)
Creatinine, Ser: 1.69 mg/dL — ABNORMAL HIGH (ref 0.44–1.00)
GFR, Estimated: 30 mL/min — ABNORMAL LOW (ref >60)
Glucose, Bld: 76 mg/dL (ref 70–99)
Potassium: 4.1 mmol/L (ref 3.5–5.1)
Sodium: 140 mmol/L (ref 135–145)

## 2020-01-22 LAB — GLUCOSE, CAPILLARY
Glucose-Capillary: 90 mg/dL (ref 70–99)
Glucose-Capillary: 128 mg/dL — ABNORMAL HIGH (ref 70–99)

## 2020-01-22 MED ORDER — CARVEDILOL 25 MG PO TABS: 25.0000 mg | ORAL_TABLET | Freq: Two times a day (BID) | ORAL | Status: DC

## 2020-01-22 MED ORDER — FUROSEMIDE 40 MG PO TABS
40.0000 mg | ORAL_TABLET | Freq: Every day | ORAL | 0 refills | Status: DC
Start: 2020-01-23 — End: 2020-02-20

## 2020-01-22 MED ORDER — CARVEDILOL 25 MG PO TABS
25.0000 mg | ORAL_TABLET | Freq: Two times a day (BID) | ORAL | 0 refills | Status: DC
Start: 2020-01-22 — End: 2020-02-15

## 2020-01-22 MED ORDER — HYDRALAZINE HCL 100 MG PO TABS: 100.0000 mg | ORAL_TABLET | Freq: Three times a day (TID) | ORAL | 0 refills | Status: DC

## 2020-01-22 NOTE — Progress Notes (Signed)
Pt ambulated in hall. No distress or complaints voiced. On Room air. Tolerated well.

## 2020-01-22 NOTE — Progress Notes (Addendum)
Progress Note  Patient Name: Donna May Date of Encounter: 01/22/2020  [facility] Cardiologist: Sanda Klein, MD  Subjective   No acute overnight events. Patient denies any significant shortness of breath. No chest pain. Feels basically back to baseline.   Inpatient Medications    Scheduled Meds: . amLODipine  10 mg Oral Daily  . aspirin EC  81 mg Oral Daily  . enoxaparin (LOVENOX) injection  30 mg Subcutaneous Q24H  . furosemide  40 mg Oral Daily  . hydrALAZINE  100 mg Oral Q8H  . insulin aspart  0-9 Units Subcutaneous TID WC  . irbesartan  300 mg Oral Daily  . labetalol  200 mg Oral BID  . omega-3 acid ethyl esters  2 g Oral Daily  . pantoprazole  40 mg Oral Daily  . potassium chloride  10 mEq Oral Daily  . sodium chloride flush  3 mL Intravenous Q12H   Continuous Infusions: . sodium chloride     PRN Meds: sodium chloride, acetaminophen, loratadine, nitroGLYCERIN, ondansetron (ZOFRAN) IV, sodium chloride flush   Vital Signs    Vitals:   01/21/20 1452 01/21/20 1700 01/21/20 2227 01/22/20 0615  BP: (!) 143/71 (!) 179/71 (!) 160/66 (!) 177/68  Pulse: (!) 59   72  Resp: 19   17  Temp: 98.2 F (36.8 C)   98.1 F (36.7 C)  TempSrc: Oral   Oral  SpO2: 99%   99%  Weight:    57.2 kg  Height:        Intake/Output Summary (Last 24 hours) at 01/22/2020 0729 Last data filed at 01/21/2020 2239 Gross per 24 hour  Intake 1086 ml  Output 320 ml  Net 766 ml   Last 3 Weights 01/22/2020 01/20/2020 01/19/2020  Weight (lbs) 126 lb 1.6 oz 124 lb 6.4 oz 128 lb 3.2 oz  Weight (kg) 57.199 kg 56.427 kg 58.151 kg      Telemetry    Sinus rhythm with rates in the 50's to low 70's. - Personally Reviewed  ECG    No new ECG tracing today. - Personally Reviewed  Physical Exam   GEN: No acute distress.   Neck: No JVD. Cardiac: RRR. No murmurs, rubs, or gallops. Radial and distal pedal pulses 2+ and equal bilaterally.  Respiratory: Clear to auscultation bilaterally. No  wheezes, rhonchi, or rales. GI: Soft, non-tender, non-distended  MS: No lower extremity edema. Lymphedema of right upper extremity. No deformity. Neuro:  No focal deficits. Psych: Normal affect. Responds appropriately.  Labs    High Sensitivity Troponin:   Recent Labs  Lab 12/28/19 1738 12/28/19 2021 01/18/20 0833 01/18/20 1735  TROPONINIHS 28* 27* 53* 45*      Chemistry Recent Labs  Lab 01/15/20 0913 01/15/20 0913 01/18/20 0532 01/18/20 0549 01/20/20 0118 01/21/20 0406 01/22/20 0420  NA 142   < > 141   < > 139 141 140  K 3.9   < > 3.7   < > 3.8 3.9 4.1  CL 111   < > 106   < > 106 105 106  CO2 26   < > 24   < > 25 27 26   GLUCOSE 79   < > 177*   < > 83 109* 76  BUN 27*   < > 26*   < > 27* 31* 35*  CREATININE 1.24*  --  1.41*   < > 1.71* 1.52* 1.69*  CALCIUM 8.4*   < > 8.8*   < > 8.4* 8.4* 8.5*  PROT 6.3*  --  7.7  --   --   --   --   ALBUMIN 2.7*  --  3.0*  --   --   --   --   AST 23  --  33  --   --   --   --   ALT 6  --  13  --   --   --   --   ALKPHOS 55  --  81  --   --   --   --   BILITOT 0.5  --  0.7  --   --   --   --   GFRNONAA 44*  --  38*   < > 30* 34* 30*  ANIONGAP 5   < > 11   < > 8 9 8    < > = values in this interval not displayed.     Hematology Recent Labs  Lab 01/15/20 0913 01/18/20 0532 01/18/20 0549  WBC 5.7 9.0  --   RBC 2.83* 3.76*  --   HGB 9.0* 12.0 12.6  HCT 28.9* 39.1 37.0  MCV 102.1* 104.0*  --   MCH 31.8 31.9  --   MCHC 31.1 30.7  --   RDW 18.5* 18.6*  --   PLT 345 450*  --     BNP Recent Labs  Lab 01/18/20 0532  BNP 1,816.9*     DDimer No results for input(s): DDIMER in the last 168 hours.   Radiology    No results found.  Cardiac Studies   Echocardiogram 01/03/2020: Impressions: 1. Left ventricular ejection fraction, by estimation, is 60 to 65%. The  left ventricle has normal function. The left ventricle has no regional  wall motion abnormalities. There is moderate concentric left ventricular  hypertrophy.  Left ventricular  diastolic parameters are consistent with Grade III diastolic dysfunction  (restrictive).  2. Right ventricular systolic function is normal. The right ventricular  size is normal. There is severely elevated pulmonary artery systolic  pressure.  3. Left atrial size was severely dilated.  4. Right atrial size was moderately dilated.  5. A small pericardial effusion is present. The pericardial effusion is  circumferential.  6. The mitral valve is normal in structure. Mild mitral valve  regurgitation.  7. Tricuspid valve regurgitation is moderate.  8. The aortic valve is tricuspid. Aortic valve regurgitation is not  visualized. No aortic stenosis is present.  9. The inferior vena cava is dilated in size with >50% respiratory  variability, suggesting right atrial pressure of 8 mmHg.   Patient Profile     79 y.o. 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  Assessment & Plan    Acute on Chronic Diastolic CHF with Acute Hypoxic Respiratory Failure (Initially Required BiPAP) - BNP elevated at 1,816 (down from 2,301 on 10/13). - Chest x-ray showed diffuse pulmonary interstitial edema/infiltrates.  - Recent Echo from 01/03/2020 showed LVEF of 60-65% with normal wall motion, moderate LVH, and grade 3 diastolic dysfunction. Also showed normal RV with severely elevated PASP, mild MR, moderate TR, and small pericardial effusion. - Initially diuresed with IV Lasix but switched to PO Lasix 40mg  daily yesterday. Net  negative 1.15 L since admission. Weight 126lb today, down 4 lbs from admission but up 2  lbs since yesterday. Dry weight around 125 lbs. Renal function slightly up from yesterday.  - Patient appears euvolemic on exam. - Continue antihypertensive as below. - Continue to monitor daily weights, strict I/O's, and renal function.  - Discussed importance of daily weights and sodium/fluid restriction after discharge.  Hypertensive Urgency - BP 230/150 on arrival. Responded to sublingual Nitro as well as restarting home medications.  - BP 160's - 190's/60's to 80's over the last 24 hours. - Current medications: Amlodipine 10mg  daily, Hydralazine 100mg  three times daily, Irbesartan 300mg  daily. Toprol-XL was switched to Labetalol yesterday with first dose scheduled for today. - Wonder whether we should use Coreg instead of Labetolol. Will discuss with MD. - Consider work-up for secondary hypertension. Will discuss repeat renal artery duplex this admission vs outpatient with MD.  Demand Ischemia - High-sensitivity troponin minimally elevated and flat at 53 >> 45. Not consistent with ACS. - EKG showed no acute ischemic changes. - No angina.  - Likely demand ischemia in setting of hypertensive urgency and acute CHF. No ischemic evaluation planned.  Hyperlipidemia - Last LDL 120 in 08/2018. - Has been off statin therapy since start of chemotherapy.  Metastatic Cholangiocarincoma  - On non-cardiotoxic therapy (gemctitabine). - Not felt to be resectable with overall poor prognosis.  - Per primary team.  For questions or updates, please contact [place] Please consult [email or web address] for contact info under        Signed, Darreld Mclean, PA-C  01/22/2020, 7:29 AM

## 2020-01-22 NOTE — Progress Notes (Signed)
[facility] ([facility]) Community Based Palliative Care       This patient is enrolled in our palliative care services in the community.  [facility] will continue to follow for any discharge planning needs and to coordinate continuation of palliative care.   If you have questions or need assistance, please call [phone] or contact the hospital Liaison listed on AMION.     Thank you for the opportunity to participate in this patient's care.     Chrislyn King, BSN, RN [facility] Hospital Liaison   [phone]   

## 2020-01-22 NOTE — TOC Initial Note (Signed)
Transition of Care [facility]) - Initial/Assessment Note    Patient Details  Name: Donna May MRN: [id number] Date of Birth: 07/23/1940  Transition of Care [facility]) CM/SW Contact:    Bethena Roys, RN Phone Number: 01/22/2020, 12:02 PM  Clinical Narrative: Risk for readmission assessment completed. Prior to admission patient was from home alone and has support of children and siblings. Patient states she gets her medications without any problems and she gets to appointments. Case Manager discussed home health with the patient and she is agreeable for services. Case Manager provided the patient with the Medicare.gov list and she chose [facility] for services. Registered Nurse for CHF management and medication assistance along with Physical Therapy for evaluation and treatment. Start of care to begin within 24-48 hours post transition home. Patient has transportation home.                    Expected Discharge Plan: [place] Barriers to Discharge: No Barriers Identified   Patient Goals and CMS Choice Patient states their goals for this hospitalization and ongoing recovery are:: to return home CMS Medicare.gov Compare Post Acute Care list provided to:: Patient Choice offered to / list presented to : Patient  Expected Discharge Plan and Services Expected Discharge Plan: [place] In-house Referral: NA Discharge Planning Services: CM Consult Post Acute Care Choice: [place] arrangements for the past 2 months: Single Family Home Expected Discharge Date: 01/22/20               DME Arranged: N/A DME Agency: NA       HH Arranged: RN, PT, Disease Management (RN for CHF and medication assistance. PT for evaluation and treatment.) [facility] Agency: [facility] Date [place]: 01/22/20 Time [place]: 80 Representative spoke with at [place]: [place]  Arrangements/Services Living arrangements for the past 2 months: [place] with:: Self (Patient has children, brother and sister that checks in on her.) Patient language and need for interpreter reviewed:: Yes Do you feel safe going back to the place where you live?: Yes      Need for Family Participation in Patient Care: Yes (Comment) Care giver support system in place?: Yes (comment)   Criminal Activity/Legal Involvement Pertinent to Current Situation/Hospitalization: No - Comment as needed   Permission Sought/Granted Permission sought to share information with : Family Supports, Customer service manager, Case Optician, dispensing granted to share information with : Yes, Verbal Permission Granted     Permission granted to share info w AGENCY: [facility]        Emotional Assessment Appearance:: Appears stated age Attitude/Demeanor/Rapport: Engaged Affect (typically observed): Appropriate Orientation: : Oriented to Self, Oriented to Place, Oriented to Situation, Oriented to  Time Alcohol / Substance Use: Not Applicable Psych Involvement: No (comment)  Admission diagnosis:  Acute CHF (congestive heart failure) (HCC) [I50.9] Acute on chronic diastolic congestive heart failure (HCC) [I50.33] Acute respiratory failure with hypoxia ([place]) [J96.01] Patient Active Problem List   Diagnosis Date Noted  . Acute CHF (congestive heart failure) ([place]) 01/18/2020  . Acute on chronic congestive heart failure ([place])   . Fever   . Acute respiratory failure ([place]) 12/28/2019  . Hypertensive urgency 12/27/2019  . Acute on chronic diastolic CHF (congestive heart failure) ([place]) 12/27/2019  . Palliative care by specialist   . DNR (do not resuscitate) discussion   . Acute respiratory failure with hypoxia (  [place]) 10/18/2019  . Hypertensive emergency 10/18/2019  . Anemia 10/18/2019  . CKD (chronic kidney disease), stage III ([place]) 10/18/2019  . Chemotherapy-induced peripheral  neuropathy ([place]) 08/28/2019  . Port-A-Cath in place 12/14/2018  . Goals of care, counseling/discussion 11/24/2018  . Status post laparoscopy 11/17/2018  . Genetic testing 11/07/2018  . Family history of prostate cancer   . Family history of breast cancer   . Intrahepatic cholangiocarcinoma ([place]) 09/20/2018  . Coronary artery calcification 04/20/2016  . Ductal carcinoma in situ (DCIS) of right breast 11/26/2015  . Lymphedema of arm 11/26/2015  . Lymphedema 11/01/2015  . Leg pain, bilateral 12/28/2013  . Special screening for malignant neoplasms, colon 08/09/2013  . Renal artery stenosis, native, bilateral ([place]) 06/09/2013  . Diabetes mellitus with renal complications ([place]) 16/12/9602  . RASH AND OTHER NONSPECIFIC SKIN ERUPTION 07/05/2008  . Malignant neoplasm of female breast ([place]) 07/04/2008  . HYPERCHOLESTEROLEMIA 07/04/2008  . Essential hypertension 07/04/2008  . GERD 07/04/2008  . DUODENITIS, WITHOUT HEMORRHAGE 07/04/2008  . HIATAL HERNIA 07/04/2008  . Arthropathy 07/04/2008   PCP:  [name], MD Pharmacy:   [id number] [place], [place] [address] [place] [address] Phone: [phone] Fax: [phone]  [facility] [place] [place] [facility] [address] [address] [place] [address] Phone: [phone] Fax: [phone]   Readmission Risk Interventions Readmission Risk Prevention Plan 01/22/2020  Transportation Screening Complete  PCP or Specialist Appt within 3-5 Days Complete  HRI or Home Care Consult Complete  Social Work Consult for [place] Planning/Counseling Complete  Palliative Care Screening Not Applicable  Medication Review Press photographer) Complete  Some recent data might be hidden

## 2020-01-22 NOTE — Discharge Instructions (Signed)
Heart Failure Education: 1. Weigh yourself EVERY morning after you go to the bathroom but before you eat or drink anything. Write this number down in a weight log/diary. If you gain 3 pounds overnight or 5 pounds in a week, call the office. 2. Take your medicines as prescribed. If you have concerns about your medications, please call us before you stop taking them.  3. Eat low salt foods--Limit salt (sodium) to 2000 mg per day. This will help prevent your body from holding onto fluid. Read food labels as many processed foods have a lot of sodium, especially canned goods and prepackaged meats. If you would like some assistance choosing low sodium foods, we would be happy to set you up with a nutritionist. 4. Stay as active as you can everyday. Staying active will give you more energy and make your muscles stronger. Start with 5 minutes at a time and work your way up to 30 minutes a day. Break up your activities--do some in the morning and some in the afternoon. Start with 3 days per week and work your way up to 5 days as you can.  If you have chest pain, feel short of breath, dizzy, or lightheaded, STOP. If you don't feel better after a short rest, call 911. If you do feel better, call the office to let us know you have symptoms with exercise. 5. Limit all fluids for the day to less than 2 liters. Fluid includes all drinks, coffee, juice, ice chips, soup, jello, and all other liquids.   Low Sodium Nutrition Therapy  Eating less sodium can help you if you have high blood pressure, heart failure, or kidney or liver disease.   Your body needs a little sodium, but too much sodium can cause your body to hold onto extra water. This extra water will raise your blood pressure and can cause damage to your heart, kidneys, or liver as they are forced to work harder.   Sometimes you can see how the extra fluid affects you because your hands, legs, or belly swell. You may also hold water around your heart and lungs,  which makes it hard to breathe.   Even if you take medication for blood pressure or a water pill (diuretic) to remove fluid, it is still important to have less salt in your diet.   Check with your primary care provider before drinking alcohol since it may affect the amount of fluid in your body and how your heart, kidneys, or liver work. Sodium in Food A low-sodium meal plan limits the sodium that you get from food and beverages to 1,500-2,000 milligrams (mg) per day. Salt is the main source of sodium. Read the nutrition label on the package to find out how much sodium is in one serving of a food.  . Select foods with 140 milligrams (mg) of sodium or less per serving.  . You may be able to eat one or two servings of foods with a little more than 140 milligrams (mg) of sodium if you are closely watching how much sodium you eat in a day.  . Check the serving size on the label. The amount of sodium listed on the label shows the amount in one serving of the food. So, if you eat more than one serving, you will get more sodium than the amount listed.  Tips Cutting Back on Sodium . Eat more fresh foods.  . Fresh fruits and vegetables are low in sodium, as well as frozen vegetables and  fruits that have no added juices or sauces.  . Fresh meats are lower in sodium than processed meats, such as bacon, sausage, and hotdogs.  . Not all processed foods are unhealthy, but some processed foods may have too much sodium.  . Eat less salt at the table and when cooking. One of the ingredients in salt is sodium.  . One teaspoon of table salt has 2,300 milligrams of sodium.  . Leave the salt out of recipes for pasta, casseroles, and soups. . Be a smart shopper.  . Food packages that say "Salt-free", sodium-free", "very low sodium," and "low sodium" have less than 140 milligrams of sodium per serving.  . Beware of products identified as "Unsalted," "No Salt Added," "Reduced Sodium," or "Lower Sodium." These items  may still be high in sodium. You should always check the nutrition label. . Add flavors to your food without adding sodium.  . Try lemon juice, lime juice, or vinegar.  . Dry or fresh herbs add flavor.  Sharyn Lull a sodium-free seasoning blend or make your own at home. . You can purchase salt-free or sodium-free condiments like barbeque sauce in stores and online. Ask your registered dietitian nutritionist for recommendations and where to find them.  .  Eating in Restaurants . Choose foods carefully when you eat outside your home. Restaurant foods can be very high in sodium. Many restaurants provide nutrition facts on their menus or their websites. If you cannot find that information, ask your server. Let your server know that you want your food to be cooked without salt and that you would like your salad dressing and sauces to be served on the side.  .   . Foods Recommended . Food Group . Foods Recommended  . Grains . Bread, bagels, rolls without salted tops Homemade bread made with reduced-sodium baking powder Cold cereals, especially shredded wheat and puffed rice Oats, grits, or cream of wheat Pastas, quinoa, and rice Popcorn, pretzels or crackers without salt Corn tortillas  . Protein Foods . Fresh meats and fish; [place] bacon (check the nutrition labels - make sure they are not packaged in a sodium solution) Canned or packed tuna (no more than 4 ounces at 1 serving) Beans and peas Soybeans) and tofu Eggs Nuts or nut butters without salt  . Dairy . Milk or milk powder Plant milks, such as rice and soy Yogurt, including Greek yogurt Small amounts of natural cheese (blocks of cheese) or reduced-sodium cheese can be used in moderation. (Swiss, ricotta, and fresh mozzarella cheese are lower in sodium than the others) Cream Cheese Low sodium cottage cheese  . Vegetables . Fresh and frozen vegetables without added sauces or salt Homemade soups (without salt) Low-sodium, salt-free or  sodium-free canned vegetables and soups  . Fruit . Fresh and canned fruits Dried fruits, such as raisins, cranberries, and prunes  . Oils . Tub or liquid margarine, regular or without salt Canola, corn, peanut, olive, safflower, or sunflower oils  . Condiments . Fresh or dried herbs such as basil, bay leaf, dill, mustard (dry), nutmeg, paprika, parsley, rosemary, sage, or thyme.  Low sodium ketchup Vinegar  Lemon or lime juice Pepper, red pepper flakes, and cayenne. Hot sauce contains sodium, but if you use just a drop or two, it will not add up to much.  Salt-free or sodium-free seasoning mixes and marinades Simple salad dressings: vinegar and oil  .  Marland Kitchen Foods Not Recommended . Food Group . Foods Not Recommended  . Grains .  Breads or crackers topped with salt Cereals (hot/cold) with more than 300 mg sodium per serving Biscuits, cornbread, and other "quick" breads prepared with baking soda Pre-packaged bread crumbs Seasoned and packaged rice and pasta mixes Self-rising flours  . Protein Foods . Cured meats: Bacon, ham, sausage, pepperoni and hot dogs Canned meats (chili, vienna sausage, or sardines) Smoked fish and meats Frozen meals that have more than 600 mg of sodium per serving Egg substitute (with added sodium)  . Dairy . Buttermilk Processed cheese spreads Cottage cheese (1 cup may have over 500 mg of sodium; look for low-sodium.) American or feta cheese Shredded Cheese has more sodium than blocks of cheese String cheese  . Vegetables . Canned vegetables (unless they are salt-free, sodium-free or low sodium) Frozen vegetables with seasoning and sauces Sauerkraut and pickled vegetables Canned or dried soups (unless they are salt-free, sodium-free, or low sodium) [place] fries and onion rings  . Fruit . Dried fruits preserved with additives that have sodium  . Oils . Salted butter or margarine, all types of olives  . Condiments . Salt, sea salt, kosher salt, onion salt, and  garlic salt Seasoning mixes with salt Bouillon cubes Ketchup Barbeque sauce and Worcestershire sauce unless low sodium Soy sauce Salsa, pickles, olives, relish Salad dressings: ranch, blue cheese, [place], and [place].  .  . Low Sodium Sample 1-Day Menu  . Breakfast . 1 cup cooked oatmeal  . 1 slice whole wheat bread toast  . 1 tablespoon peanut butter without salt  . 1 banana  . 1 cup 1% milk  . Lunch . Tacos made with: 2 corn tortillas  .  cup black beans, low sodium  .  cup roasted or grilled chicken (without skin)  .  avocado  . Squeeze of lime juice  . 1 cup salad greens  . 1 tablespoon low-sodium salad dressing  .  cup strawberries  . 1 orange  . Afternoon Snack . 1/3 cup grapes  . 6 ounces yogurt  . Evening Meal . 3 ounces herb-baked fish  . 1 baked potato  . 2 teaspoons olive oil  .  cup cooked carrots  . 2 thick slices tomatoes on:  . 2 lettuce leaves  . 1 teaspoon olive oil  . 1 teaspoon balsamic vinegar  . 1 cup 1% milk  . Evening Snack . 1 apple  .  cup almonds without salt  .  Marland Kitchen Low-Sodium Vegetarian (Lacto-Ovo) Sample 1-Day Menu  . Breakfast . 1 cup cooked oatmeal  . 1 slice whole wheat toast  . 1 tablespoon peanut butter without salt  . 1 banana  . 1 cup 1% milk  . Lunch . Tacos made with: 2 corn tortillas  .  cup black beans, low sodium  .  cup roasted or grilled chicken (without skin)  .  avocado  . Squeeze of lime juice  . 1 cup salad greens  . 1 tablespoon low-sodium salad dressing  .  cup strawberries  . 1 orange  . Evening Meal . Stir fry made with:  cup tofu  . 1 cup brown rice  .  cup broccoli  .  cup green beans  .  cup peppers  .  tablespoon peanut oil  . 1 orange  . 1 cup 1% milk  . Evening Snack . 4 strips celery  . 2 tablespoons hummus  . 1 hard-boiled egg  .  Marland Kitchen Low-Sodium Vegan Sample 1-Day Menu  . Breakfast . 1 cup cooked oatmeal  .  1 tablespoon peanut butter without salt  . 1 cup blueberries  . 1 cup  soymilk fortified with calcium, vitamin B12, and vitamin D  . Lunch . 1 small whole wheat pita  .  cup cooked lentils  . 2 tablespoons hummus  . 4 carrot sticks  . 1 medium apple  . 1 cup soymilk fortified with calcium, vitamin B12, and vitamin D  . Evening Meal . Stir fry made with:  cup tofu  . 1 cup brown rice  .  cup broccoli  .  cup green beans  .  cup peppers  .  tablespoon peanut oil  . 1 cup cantaloupe  . Evening Snack . 1 cup soy yogurt  .  cup mixed nuts  . Copyright 2020  [employer]. All rights reserved .  Marland Kitchen Sodium Free Flavoring Tips .  Marland Kitchen When cooking, the following items may be used for flavoring instead of salt or seasonings that contain sodium. . Remember: A little bit of spice goes a long way! Be careful not to overseason. Marland Kitchen Spice Blend Recipe (makes about ? cup) . 5 teaspoons onion powder  . 2 teaspoons garlic powder  . 2 teaspoons paprika  . 2 teaspoon dry mustard  . 1 teaspoon crushed thyme leaves  .  teaspoon white pepper  .  teaspoon celery seed Food Item Flavorings  Beef Basil, bay leaf, caraway, curry, dill, dry mustard, garlic, grape jelly, green pepper, mace, marjoram, mushrooms (fresh), nutmeg, onion or onion powder, parsley, pepper, rosemary, sage  Chicken Basil, cloves, cranberries, mace, mushrooms (fresh), nutmeg, oregano, paprika, parsley, pineapple, saffron, sage, savory, tarragon, thyme, tomato, turmeric  Egg Chervil, curry, dill, dry mustard, garlic or garlic powder, green pepper, jelly, mushrooms (fresh), nutmeg, onion powder, paprika, parsley, rosemary, tarragon, tomato  Fish Basil, bay leaf, chervil, curry, dill, dry mustard, green pepper, lemon juice, marjoram, mushrooms (fresh), paprika, pepper, tarragon, tomato, turmeric  Lamb Cloves, curry, dill, garlic or garlic powder, mace, mint, mint jelly, onion, oregano, parsley, pineapple, rosemary, tarragon, thyme  Pork Applesauce, basil, caraway, chives, cloves,  garlic or garlic powder, onion or onion powder, rosemary, thyme  Veal Apricots, basil, bay leaf, currant jelly, curry, ginger, marjoram, mushrooms (fresh), oregano, paprika  Vegetables Basil, dill, garlic or garlic powder, ginger, lemon juice, mace, marjoram, nutmeg, onion or onion powder, tarragon, tomato, sugar or sugar substitute, salt-free salad dressing, vinegar  Desserts Allspice, anise, cinnamon, cloves, ginger, mace, nutmeg, vanilla extract, other extracts   Copyright 2020  [employer]y [employer]. All rights reserved  A fluid is anything that is liquid or anything that would melt if left at room temperature. You will need to count these foods and liquids--including any liquid used to take medication--as part of your daily fluid intake. Some examples are: . Alcohol (drink only with your doctor's permission)  . Coffee, tea, and other hot beverages  . Gelatin (Jell-O)  . Gravy  . Ice cream, sherbet, sorbet  . Ice cubes, ice chips  . Milk, liquid creamer  . Nutritional supplements  . Popsicles  . Vegetable and fruit juices; fluid in canned fruit  . Watermelon  . Yogurt  . Soft drinks, lemonade, limeade  . Soups  . Syrup How Do I Measure My Fluid Intake? Marland Kitchen Record your fluid intake daily.  . Tip: Every day, each time you eat or drink fluids, pour water in the same amount into an empty container that can hold the same amount of fluids you are  allowed daily. This may help you keep track of how much fluid you are taking in throughout the day.  . To accurately keep track of how much liquid you take in, measure the size of the cups, glasses, and bowls you use. If you eat soup, measure how much of it is liquid and how much is solid (such as noodles, vegetables, meat). Conversions for Measuring Fluid Intake  Milliliters (mL) Liters (L) Ounces (oz) Cups (c)  1000 1 32 4  1200 1.2 40 5  1500 1.5 50 6 1/4  1800 1.8 60 7 1/2  2000 2 67 8 1/3  Tips to Reduce Your Thirst . Chew  gum or suck on hard candy.  . Rinse or gargle with mouthwash. Do not swallow.  . Ice chips or popsicles my help quench thirst, but this too needs to be calculated into the total restriction. Melt ice chips or cubes first to figure out how much fluid they produce (for example, experiment with melting  cup ice chips or 2 ice cubes).  . Add a lemon wedge to your water.  . Limit how much salt you take in. A high salt intake might make you thirstier.  . Don't eat or drink all your allowed liquids at once. Space your liquids out through the day.  . Use small glasses and cups and sip slowly. If allowed, take your medications with fluids you eat or drink during a meal.   Fluid-Restricted Nutrition Therapy Sample 1-Day Menu  Breakfast 1 slice wheat toast  1 tablespoon peanut butter  1/2 cup yogurt (120 milliliters)  1/2 cup blueberries  1 cup milk (240 milliliters)   Lunch 3 ounces sliced [place]  2 slices whole wheat bread  1/2 cup lettuce for sandwich  2 slices tomato for sandwich  1 ounce reduced-fat, reduced-sodium cheese  1/2 cup fresh carrot sticks  1 banana  1 cup unsweetened tea (240 milliliters)   Evening Meal 8 ounces soup (240 milliliters)  3 ounces salmon  1/2 cup quinoa  1 cup green beans  1 cup mixed greens salad  1 tablespoon olive oil  1 cup coffee (240 milliliters)  Evening Snack 1/2 cup sliced peaches  1/2 cup frozen yogurt (120 milliliters)  1 cup water (240 milliliters)  Copyright 2020  [employer]. All rights reserved

## 2020-01-22 NOTE — Discharge Summary (Signed)
Physician Discharge Summary  Donna May EXH:[id number] DOB: 08-12-1940 DOA: 01/18/2020  PCP: [name], MD  Admit date: 01/18/2020 Discharge date: 01/22/2020  Admitted From: Home Disposition: Home with home health  Recommendations for Outpatient Follow-up:  1. Follow up with PCP in 1-2 weeks 2. Cardiology in 1 to 2 weeks as scheduled:  Home Health: Yes Equipment/Devices: None  Discharge Condition: Stable CODE STATUS: DO NOT INTUBATE Diet recommendation: Low-salt, low-fat low-carb diet fluid restriction 1500 cc  Brief/Interim Summary: forchronic diastolic dysfunction CHF(Last known LVEF 60 - 65%),diabetes mellitus with complications of stage III chronic kidney disease,history of cholangiocarcinoma on chemotherapy,hypertension, history of prior right breast cancer who was brought into the emergency room by EMS for evaluation of sudden onset shortness of breath that started about 4 AM this morning. EMS was called and when they arrived patient had room air pulse oximetry of 70%. EMS attempted to place patient on a nonrebreather mask but she was unable to tolerate it, she was placed on 3 L of oxygen with improved pulse oximetry to 90%. Upon arrival to the ER she was noted to have blood pressure 230/150, respiratory rate in the 40s and was tachycardic with heart rate of 100 bpm. Patient was placed on noninvasive mechanical ventilation to reduce work of breathing. She denies having any chest pain,, no nausea, no vomiting,no diaphoresis, no abdominal pain, no changes in her bowel habits, no fever, no cough, no dizziness or lightheadedness. She has no urinary symptoms. Labs show sodium 146, potassium 3.7, chloride 110, bicarb 24, glucose 178, BUN 26, creatinine 1.30, calcium 8.8, alkaline phosphatase 81, albumin 3.0, AST 33, ALT 13, total protein 7.7, BNP 1816, white count 9.0, hemoglobin 12.6, hematocrit 37.0, MCV 104, RDW 18.6, platelet count 450, Respiratory viral panel is negative.  Chest x-ray shows cardiomegaly with diffuse bilateral pulmonary interstitial edema. Twelve-lead EKGshows sinus rhythm with left atrial enlargement.  Patient met as above with acute hypoxic respiratory failure in the setting of heart failure exacerbation hypertensive emergency and likely chronic debilitation.  Patient was diuresed quite aggressively at intake, her blood pressure remains poorly controlled for the first 48 hours of her hospitalization, after reinitiation of home medications her blood pressure did improve somewhat but not to within normal limits.  Cardiology was consulted and following along appreciate insight and recommendations, at this time we have increased her hydralazine to 100 mg 3 times daily as well as transition from metoprolol to carvedilol.  She remains on 5 antihypertensive medications which we discussed at her age was not ideal, lengthy discussion daily about need for low-salt low-fat diet, volume and fluid restriction, she continued to impress upon me and the staff at bedside that she eats out quite frequently usually at fast food which is likely the etiology of her ongoing hypertension and heart failure exacerbations.  At this point she is otherwise stable and agreeable for discharge home, has diuresed well, and is recommended for home health physical therapy by PT given her ongoing ambulatory dysfunction and debility in the setting of advanced age and chronic comorbid conditions.  Patient otherwise to follow-up with PCP and cardiology in the next 1 to 2 weeks as scheduled.   Discharge Diagnoses:  Principal Problem:   Acute on chronic diastolic CHF (congestive heart failure) (HCC) Active Problems:   Diabetes mellitus with renal complications (HCC)   GERD   Intrahepatic cholangiocarcinoma (HCC)   Chemotherapy-induced peripheral neuropathy (HCC)   CKD (chronic kidney disease), stage III (HCC)   Hypertensive urgency  Acute respiratory failure [facility])    Discharge  Instructions  Discharge Instructions    Call MD for:  difficulty breathing, headache or visual disturbances   Complete by: As directed    Call MD for:  extreme fatigue   Complete by: As directed    Call MD for:  persistant dizziness or light-headedness   Complete by: As directed    Diet - low sodium heart healthy   Complete by: As directed    Increase activity slowly   Complete by: As directed      Allergies as of 01/22/2020   No Known Allergies     Medication List    STOP taking these medications   hydrochlorothiazide 12.5 MG capsule Commonly known as: MICROZIDE   metoprolol succinate 100 MG 24 hr tablet Commonly known as: TOPROL-XL     TAKE these medications   amLODipine 10 MG tablet Commonly known as: NORVASC Take 1 tablet (10 mg total) by mouth daily.   aspirin 81 MG tablet Take 81 mg by mouth daily.   carvedilol 25 MG tablet Commonly known as: COREG Take 1 tablet (25 mg total) by mouth 2 (two) times daily with a meal.   furosemide 40 MG tablet Commonly known as: Lasix Take 1 tablet (40 mg total) by mouth 2 (two) times a week. What changed:   when to take this  additional instructions   glucose blood test strip 1 each by Other route 2 (two) times daily. Use Onetouch verio test strips as instructed to check blood sugar twice daily.   hydrALAZINE 100 MG tablet Commonly known as: APRESOLINE Take 1 tablet (100 mg total) by mouth every 8 (eight) hours. What changed:   medication strength  how much to take   lansoprazole 15 MG capsule Commonly known as: PREVACID Take 1 capsule (15 mg total) by mouth daily.   loratadine 10 MG tablet Commonly known as: CLARITIN Take 10 mg by mouth daily as needed for allergies.   Omega 3 1000 MG Caps Take 2,000 mg by mouth daily.   potassium chloride 10 MEQ tablet Commonly known as: KLOR-CON Take 1 tablet (10 mEq total) by mouth 2 (two) times a week. What changed:   when to take this  additional  instructions   sitaGLIPtin 100 MG tablet Commonly known as: Januvia Take 1 tablet (100 mg total) by mouth daily.   valsartan 320 MG tablet Commonly known as: DIOVAN Take 1 tablet (320 mg total) by mouth daily.       Follow-up Information    [facility]-Home Follow up.   Specialty: Home Health Services Why: Home Health Registered Nurse and Physical Therapy-office to call with a visit time. If you have questions please call [phone]             No Known Allergies  Consultations:  Cardiology   Procedures/Studies: CT ABDOMEN PELVIS WO CONTRAST  Result Date: 01/01/2020 CLINICAL DATA:  79 year old female with shortness of breath and productive cough. History of cholangiocarcinoma. EXAM: CT CHEST, ABDOMEN AND PELVIS WITHOUT CONTRAST TECHNIQUE: Multidetector CT imaging of the chest, abdomen and pelvis was performed following the standard protocol without IV contrast. COMPARISON:  Chest radiograph dated 12/29/2019 and CT dated 01/20/2019. FINDINGS: Evaluation of this exam is limited in the absence of intravenous contrast. CT CHEST FINDINGS Cardiovascular: Borderline cardiomegaly. No pericardial effusion. There is coronary vascular calcification. There is hypoattenuation of the cardiac blood pool suggestive of anemia. Clinical correlation is recommended. There is moderate atherosclerotic calcification of  the thoracic aorta. No aneurysmal dilatation. There is dilatation of the main pulmonary trunk suggestive of a degree of pulmonary hypertension. Clinical correlation is recommended. Left-sided Port-A-Cath with tip at the cavoatrial junction. Mediastinum/Nodes: No definite hilar or mediastinal adenopathy. Evaluation however is limited in the absence of intravenous contrast. The esophagus and the thyroid gland are grossly unremarkable as visualized. No mediastinal fluid collection. Lungs/Pleura: Small bilateral pleural effusions. There are subsegmental bilateral lower lobe  consolidative changes which may represent atelectasis or pneumonia. Similar appearance of a 5 mm nodular density in the right lower lobe (95/7). There is no pneumothorax. The central airways are patent. Musculoskeletal: Degenerative changes of the spine. No acute osseous pathology. CT ABDOMEN PELVIS FINDINGS No intra-abdominal free air or free fluid. Hepatobiliary: Several small hepatic hypodense lesions which are not characterized on this noncontrast CT but demonstrate fluid attenuation, likely cysts. These appear similar to prior CT. There is interval decrease in the size of the segment 4 low attenuating area now measuring approximately 2.2 x 4.3 cm (previously 4.2 x 7.0 cm). The gallbladder is unremarkable as visualized. Pancreas: The pancreas is grossly unremarkable. Spleen: Normal in size without focal abnormality. Adrenals/Urinary Tract: The adrenal glands unremarkable. There is no hydronephrosis or nephrolithiasis on either side. Multiple bilateral renal cysts as seen previously. The visualized ureters and urinary bladder appear unremarkable. Stomach/Bowel: There is moderate stool throughout the colon. There is no bowel obstruction or active inflammation. The appendix is normal. Vascular/Lymphatic: Advanced aortoiliac atherosclerotic disease. The IVC is unremarkable. No portal venous gas. There is no adenopathy. Reproductive: Small partially calcified uterine fibroids. Other: Diffuse subcutaneous edema and anasarca new since the prior CT. Musculoskeletal: Degenerative changes.  No acute osseous pathology. IMPRESSION: 1. Small bilateral pleural effusions with subsegmental bilateral lower lobe consolidative changes which may represent atelectasis or pneumonia. Clinical correlation is recommended. This finding is new compared to prior CT. 2. Interval decrease in the size of the segment IV hepatic low attenuating area. 3. Anasarca, new since the prior CT. 4. Aortic Atherosclerosis (ICD10-I70.0). Electronically  Signed   By: Anner Crete M.D.   On: 01/01/2020 23:17   CT CHEST WO CONTRAST  Result Date: 01/01/2020 CLINICAL DATA:  79 year old female with shortness of breath and productive cough. History of cholangiocarcinoma. EXAM: CT CHEST, ABDOMEN AND PELVIS WITHOUT CONTRAST TECHNIQUE: Multidetector CT imaging of the chest, abdomen and pelvis was performed following the standard protocol without IV contrast. COMPARISON:  Chest radiograph dated 12/29/2019 and CT dated 01/20/2019. FINDINGS: Evaluation of this exam is limited in the absence of intravenous contrast. CT CHEST FINDINGS Cardiovascular: Borderline cardiomegaly. No pericardial effusion. There is coronary vascular calcification. There is hypoattenuation of the cardiac blood pool suggestive of anemia. Clinical correlation is recommended. There is moderate atherosclerotic calcification of the thoracic aorta. No aneurysmal dilatation. There is dilatation of the main pulmonary trunk suggestive of a degree of pulmonary hypertension. Clinical correlation is recommended. Left-sided Port-A-Cath with tip at the cavoatrial junction. Mediastinum/Nodes: No definite hilar or mediastinal adenopathy. Evaluation however is limited in the absence of intravenous contrast. The esophagus and the thyroid gland are grossly unremarkable as visualized. No mediastinal fluid collection. Lungs/Pleura: Small bilateral pleural effusions. There are subsegmental bilateral lower lobe consolidative changes which may represent atelectasis or pneumonia. Similar appearance of a 5 mm nodular density in the right lower lobe (95/7). There is no pneumothorax. The central airways are patent. Musculoskeletal: Degenerative changes of the spine. No acute osseous pathology. CT ABDOMEN PELVIS FINDINGS No intra-abdominal free air or free  fluid. Hepatobiliary: Several small hepatic hypodense lesions which are not characterized on this noncontrast CT but demonstrate fluid attenuation, likely cysts. These  appear similar to prior CT. There is interval decrease in the size of the segment 4 low attenuating area now measuring approximately 2.2 x 4.3 cm (previously 4.2 x 7.0 cm). The gallbladder is unremarkable as visualized. Pancreas: The pancreas is grossly unremarkable. Spleen: Normal in size without focal abnormality. Adrenals/Urinary Tract: The adrenal glands unremarkable. There is no hydronephrosis or nephrolithiasis on either side. Multiple bilateral renal cysts as seen previously. The visualized ureters and urinary bladder appear unremarkable. Stomach/Bowel: There is moderate stool throughout the colon. There is no bowel obstruction or active inflammation. The appendix is normal. Vascular/Lymphatic: Advanced aortoiliac atherosclerotic disease. The IVC is unremarkable. No portal venous gas. There is no adenopathy. Reproductive: Small partially calcified uterine fibroids. Other: Diffuse subcutaneous edema and anasarca new since the prior CT. Musculoskeletal: Degenerative changes.  No acute osseous pathology. IMPRESSION: 1. Small bilateral pleural effusions with subsegmental bilateral lower lobe consolidative changes which may represent atelectasis or pneumonia. Clinical correlation is recommended. This finding is new compared to prior CT. 2. Interval decrease in the size of the segment IV hepatic low attenuating area. 3. Anasarca, new since the prior CT. 4. Aortic Atherosclerosis (ICD10-I70.0). Electronically Signed   By: Anner Crete M.D.   On: 01/01/2020 23:17   DG Chest Portable 1 View  Result Date: 01/18/2020 CLINICAL DATA:  Shortness of breath. EXAM: PORTABLE CHEST 1 VIEW COMPARISON:  CT chest 01/01/2020.  Chest x-ray 12/29/2019. FINDINGS: PowerPort catheter stable position. Cardiomegaly. Diffuse bilateral pulmonary interstitial edema/infiltrates noted on today's exam. No pleural effusion. No pneumothorax. Surgical clips right chest. IMPRESSION: 1. PowerPort catheter in stable position. 2. Cardiomegaly.  Diffuse bilateral pulmonary interstitial edema/infiltrates noted on today's exam. Electronically Signed   By: Marcello Moores  Register   On: 01/18/2020 05:47   DG CHEST PORT 1 VIEW  Result Date: 12/29/2019 CLINICAL DATA:  Follow-up pulmonary edema EXAM: PORTABLE CHEST 1 VIEW COMPARISON:  12/27/2019 FINDINGS: Cardiac shadow remains enlarged. Left chest wall port is again noted. Resolution of previously seen airspace opacities is noted consistent with resolving edema. No bony abnormality is noted. IMPRESSION: Previously seen edema has resolved in the interval. No new focal abnormality is seen. Electronically Signed   By: Inez Catalina M.D.   On: 12/29/2019 19:22   DG Chest Port 1 View  Result Date: 12/27/2019 CLINICAL DATA:  79 year old female with shortness of breath. Symptom onset 1800 hours. Extremity swelling. EXAM: PORTABLE CHEST 1 VIEW COMPARISON:  Portable chest 10/20/2019 and earlier. FINDINGS: Portable AP semi upright view at 2107 hours. Stable cardiomegaly and mediastinal contours. Stable left chest power port. Asymmetric patchy and interstitial pulmonary opacity greater in the right lung. Basilar predominance. Appearance is similar but less pronounced than that in 2022/11/11. No pneumothorax. No pleural effusion is evident. No acute osseous abnormality identified. Negative visible bowel gas pattern. Chronic right trust wall surgical clips. IMPRESSION: Chronic cardiomegaly with asymmetric lung opacity favored due to pulmonary edema. Viral/atypical respiratory infection is felt less likely. Electronically Signed   By: Genevie Ann M.D.   On: 12/27/2019 21:26   ECHOCARDIOGRAM COMPLETE  Result Date: 01/03/2020    ECHOCARDIOGRAM REPORT   Patient Name:   Donna May Date of Exam: 01/03/2020 Medical Rec #:  [id number]     Height:       65.0 in Accession #:    [id number]    Weight:  126.5 lb Date of Birth:  April 15, 1940    BSA:          1.628 m Patient Age:    78 years      BP:           137/56 mmHg Patient  Gender: F             HR:           68 bpm. Exam Location:  Inpatient Procedure: 2D Echo, Cardiac Doppler and Color Doppler Indications:    Fever  History:        Patient has prior history of Echocardiogram examinations, most                 recent 10/19/2019. CHF; Risk Factors:Diabetes, Hypertension,                 Former Smoker and Dyslipidemia. CKD.  Sonographer:    Ross Ludwig RDCS (AE) Referring Phys: [id number] JENNIFER CHOI IMPRESSIONS  1. Left ventricular ejection fraction, by estimation, is 60 to 65%. The left ventricle has normal function. The left ventricle has no regional wall motion abnormalities. There is moderate concentric left ventricular hypertrophy. Left ventricular diastolic parameters are consistent with Grade III diastolic dysfunction (restrictive).  2. Right ventricular systolic function is normal. The right ventricular size is normal. There is severely elevated pulmonary artery systolic pressure.  3. Left atrial size was severely dilated.  4. Right atrial size was moderately dilated.  5. A small pericardial effusion is present. The pericardial effusion is circumferential.  6. The mitral valve is normal in structure. Mild mitral valve regurgitation.  7. Tricuspid valve regurgitation is moderate.  8. The aortic valve is tricuspid. Aortic valve regurgitation is not visualized. No aortic stenosis is present.  9. The inferior vena cava is dilated in size with >50% respiratory variability, suggesting right atrial pressure of 8 mmHg. Comparison(s): No prior Echocardiogram. Conclusion(s)/Recommendation(s): Severely elevated right sided filling pressures, grade 3 diastilic dysfunction. FINDINGS  Left Ventricle: Myocardial appearance suggests possibility of amyloid. Left ventricular ejection fraction, by estimation, is 60 to 65%. The left ventricle has normal function. The left ventricle has no regional wall motion abnormalities. The left ventricular internal cavity size was normal in size. There is  moderate concentric left ventricular hypertrophy. Left ventricular diastolic parameters are consistent with Grade III diastolic dysfunction (restrictive). Right Ventricle: The right ventricular size is normal. No increase in right ventricular wall thickness. Right ventricular systolic function is normal. There is severely elevated pulmonary artery systolic pressure. The tricuspid regurgitant velocity is 4.10 m/s, and with an assumed right atrial pressure of 8 mmHg, the estimated right ventricular systolic pressure is 75.2 mmHg. Left Atrium: Left atrial size was severely dilated. Right Atrium: Right atrial size was moderately dilated. Pericardium: A small pericardial effusion is present. The pericardial effusion is circumferential. Mitral Valve: The mitral valve is normal in structure. There is mild thickening of the mitral valve leaflet(s). There is mild calcification of the mitral valve leaflet(s). Mild mitral valve regurgitation. MV peak gradient, 4.6 mmHg. The mean mitral valve  gradient is 1.0 mmHg. Tricuspid Valve: The tricuspid valve is normal in structure. Tricuspid valve regurgitation is moderate. Aortic Valve: The aortic valve is tricuspid. Aortic valve regurgitation is not visualized. No aortic stenosis is present. Aortic valve mean gradient measures 3.0 mmHg. Aortic valve peak gradient measures 6.1 mmHg. Aortic valve area, by VTI measures 2.53 cm. Pulmonic Valve: The pulmonic valve was grossly normal. Pulmonic valve regurgitation is trivial. No  evidence of pulmonic stenosis. Aorta: The aortic root and ascending aorta are structurally normal, with no evidence of dilitation. Venous: The inferior vena cava is dilated in size with greater than 50% respiratory variability, suggesting right atrial pressure of 8 mmHg. IAS/Shunts: The atrial septum is grossly normal.  LEFT VENTRICLE PLAX 2D LVIDd:         3.90 cm  Diastology LVIDs:         2.60 cm  LV e' medial:    6.42 cm/s LV PW:         1.70 cm  LV E/e'  medial:  14.8 LV IVS:        1.60 cm  LV e' lateral:   9.36 cm/s LVOT diam:     2.00 cm  LV E/e' lateral: 10.1 LV SV:         63 LV SV Index:   38 LVOT Area:     3.14 cm  RIGHT VENTRICLE             IVC RV Basal diam:  3.70 cm     IVC diam: 2.10 cm RV S prime:     11.40 cm/s TAPSE (M-mode): 1.9 cm LEFT ATRIUM             Index       RIGHT ATRIUM           Index LA diam:        4.60 cm 2.83 cm/m  RA Area:     18.30 cm LA Vol (A2C):   96.5 ml 59.27 ml/m RA Volume:   50.20 ml  30.83 ml/m LA Vol (A4C):   80.5 ml 49.44 ml/m LA Biplane Vol: 96.2 ml 59.08 ml/m  AORTIC VALVE AV Area (Vmax):    2.58 cm AV Area (Vmean):   2.41 cm AV Area (VTI):     2.53 cm AV Vmax:           123.00 cm/s AV Vmean:          83.000 cm/s AV VTI:            0.247 m AV Peak Grad:      6.1 mmHg AV Mean Grad:      3.0 mmHg LVOT Vmax:         101.00 cm/s LVOT Vmean:        63.700 cm/s LVOT VTI:          0.199 m LVOT/AV VTI ratio: 0.81  AORTA Ao Root diam: 3.20 cm Ao Asc diam:  2.90 cm MITRAL VALVE               TRICUSPID VALVE MV Area (PHT): 2.80 cm    TR Peak grad:   67.2 mmHg MV Peak grad:  4.6 mmHg    TR Vmax:        410.00 cm/s MV Mean grad:  1.0 mmHg MV Vmax:       1.07 m/s    SHUNTS MV Vmean:      53.7 cm/s   Systemic VTI:  0.20 m MV Decel Time: 271 msec    Systemic Diam: 2.00 cm MV E velocity: 94.70 cm/s MV A velocity: 32.10 cm/s MV E/A ratio:  2.95 Buford Dresser MD Electronically signed by Buford Dresser MD Signature Date/Time: 01/03/2020/8:19:23 PM    Final    VAS [place] LOWER EXTREMITY VENOUS (DVT)  Result Date: 01/03/2020  Lower Venous DVTStudy Indications: Fever of unkown origin.  Comparison Study: No prior studies. Performing Technologist:  Darlin Coco  Examination Guidelines: A complete evaluation includes B-mode imaging, spectral Doppler, color Doppler, and power Doppler as needed of all accessible portions of each vessel. Bilateral testing is considered an integral part of a complete examination. Limited  examinations for reoccurring indications may be performed as noted. The reflux portion of the exam is performed with the patient in reverse Trendelenburg.  +---------+---------------+---------+-----------+----------+--------------+ RIGHT    CompressibilityPhasicitySpontaneityPropertiesThrombus Aging +---------+---------------+---------+-----------+----------+--------------+ CFV      Full           Yes      Yes                                 +---------+---------------+---------+-----------+----------+--------------+ SFJ      Full                                                        +---------+---------------+---------+-----------+----------+--------------+ FV Prox  Full                                                        +---------+---------------+---------+-----------+----------+--------------+ FV Mid   Full                                                        +---------+---------------+---------+-----------+----------+--------------+ FV DistalFull                                                        +---------+---------------+---------+-----------+----------+--------------+ PFV      Full                                                        +---------+---------------+---------+-----------+----------+--------------+ POP      Full           Yes      Yes                                 +---------+---------------+---------+-----------+----------+--------------+ PTV      Full                                                        +---------+---------------+---------+-----------+----------+--------------+ PERO     Full                                                        +---------+---------------+---------+-----------+----------+--------------+   +---------+---------------+---------+-----------+----------+--------------+  LEFT     CompressibilityPhasicitySpontaneityPropertiesThrombus Aging  +---------+---------------+---------+-----------+----------+--------------+ CFV      Full           Yes      Yes                                 +---------+---------------+---------+-----------+----------+--------------+ SFJ      Full                                                        +---------+---------------+---------+-----------+----------+--------------+ FV Prox  Full                                                        +---------+---------------+---------+-----------+----------+--------------+ FV Mid   Full                                                        +---------+---------------+---------+-----------+----------+--------------+ FV DistalFull                                                        +---------+---------------+---------+-----------+----------+--------------+ PFV      Full                                                        +---------+---------------+---------+-----------+----------+--------------+ POP      Full           Yes      Yes                                 +---------+---------------+---------+-----------+----------+--------------+ PTV      Full                                                        +---------+---------------+---------+-----------+----------+--------------+ PERO     Full                                                        +---------+---------------+---------+-----------+----------+--------------+     Summary: RIGHT: - There is no evidence of deep vein thrombosis in the lower extremity.  - No cystic structure found in the popliteal fossa.  LEFT: - There is no evidence of deep vein thrombosis in the lower extremity.  - No  cystic structure found in the popliteal fossa.  *See table(s) above for measurements and observations. Electronically signed by Harold Barban MD on 01/03/2020 at 7:39:03 PM.    Final       Subjective: No acute issues or events overnight, patient denies palpitations,  shortness of breath, chest pain, nausea, vomiting, diarrhea, constipation, headache, fevers, chills.   Discharge Exam: Vitals:   01/22/20 0846 01/22/20 0932  BP: (!) 167/64 (!) 157/57  Pulse: 72   Resp: 18   Temp: 98.3 F (36.8 C)   SpO2: 100%    Vitals:   01/21/20 2227 01/22/20 0615 01/22/20 0846 01/22/20 0932  BP: (!) 160/66 (!) 177/68 (!) 167/64 (!) 157/57  Pulse:  72 72   Resp:  17 18   Temp:  98.1 F (36.7 C) 98.3 F (36.8 C)   TempSrc:  Oral Oral   SpO2:  99% 100%   Weight:  57.2 kg    Height:        General: Pt is alert, awake, not in acute distress Cardiovascular: RRR, S1/S2 +, no rubs, no gallops Respiratory: CTA bilaterally, no wheezing, no rhonchi Abdominal: Soft, NT, ND, bowel sounds + Extremities: no edema, no cyanosis  The results of significant diagnostics from this hospitalization (including imaging, microbiology, ancillary and laboratory) are listed below for reference.     Microbiology: Recent Results (from the past 240 hour(s))  Respiratory Panel by RT PCR (Flu A&B, Covid) - Nasopharyngeal Swab     Status: None   Collection Time: 01/18/20  5:34 AM   Specimen: Nasopharyngeal Swab  Result Value Ref Range Status   SARS Coronavirus 2 by RT PCR NEGATIVE NEGATIVE Final    Comment: (NOTE) SARS-CoV-2 target nucleic acids are NOT DETECTED.  The SARS-CoV-2 RNA is generally detectable in upper respiratoy specimens during the acute phase of infection. The lowest concentration of SARS-CoV-2 viral copies this assay can detect is 131 copies/mL. A negative result does not preclude SARS-Cov-2 infection and should not be used as the sole basis for treatment or other patient management decisions. A negative result may occur with  improper specimen collection/handling, submission of specimen other than nasopharyngeal swab, presence of viral mutation(s) within the areas targeted by this assay, and inadequate number of viral copies (<131 copies/mL). A negative  result must be combined with clinical observations, patient history, and epidemiological information. The expected result is Negative.  Fact Sheet for Patients:  PinkCheek.be  Fact Sheet for Healthcare Providers:  GravelBags.it  This test is no t yet approved or cleared by the [place] FDA and  has been authorized for detection and/or diagnosis of SARS-CoV-2 by FDA under an Emergency Use Authorization (EUA). This EUA will remain  in effect (meaning this test can be used) for the duration of the COVID-19 declaration under Section 564(b)(1) of the Act, 21 U.S.C. section 360bbb-3(b)(1), unless the authorization is terminated or revoked sooner.     Influenza A by PCR NEGATIVE NEGATIVE Final   Influenza B by PCR NEGATIVE NEGATIVE Final    Comment: (NOTE) The Xpert Xpress SARS-CoV-2/FLU/RSV assay is intended as an aid in  the diagnosis of influenza from Nasopharyngeal swab specimens and  should not be used as a sole basis for treatment. Nasal washings and  aspirates are unacceptable for Xpert Xpress SARS-CoV-2/FLU/RSV  testing.  Fact Sheet for Patients: PinkCheek.be  Fact Sheet for Healthcare Providers: GravelBags.it  This test is not yet approved or cleared by the [place] FDA and  has been authorized for detection  and/or diagnosis of SARS-CoV-2 by  FDA under an Emergency Use Authorization (EUA). This EUA will remain  in effect (meaning this test can be used) for the duration of the  Covid-19 declaration under Section 564(b)(1) of the Act, 21  U.S.C. section 360bbb-3(b)(1), unless the authorization is  terminated or revoked. Performed at [facility], [place] [address], [place], [place] [address]      Labs: BNP (last 3 results) Recent Labs    10/19/19 0019 12/27/19 2050 01/18/20 0532  BNP 2,498.8* 2,301.1* 7,124.5*   Basic Metabolic  Panel: Recent Labs  Lab 01/18/20 0532 01/18/20 0532 01/18/20 0549 01/19/20 0119 01/20/20 0118 01/21/20 0406 01/22/20 0420  NA 141   < > 146* 141 139 141 140  K 3.7   < > 3.7 3.8 3.8 3.9 4.1  CL 106   < > 110 108 106 105 106  CO2 24  --   --  $R'24 25 27 26  'Hb$ GLUCOSE 177*   < > 178* 96 83 109* 76  BUN 26*   < > 26* 29* 27* 31* 35*  CREATININE 1.41*   < > 1.30* 1.46* 1.71* 1.52* 1.69*  CALCIUM 8.8*  --   --  8.2* 8.4* 8.4* 8.5*  MG 2.4  --   --   --   --   --   --    < > = values in this interval not displayed.   Liver Function Tests: Recent Labs  Lab 01/18/20 0532  AST 33  ALT 13  ALKPHOS 81  BILITOT 0.7  PROT 7.7  ALBUMIN 3.0*   No results for input(s): LIPASE, AMYLASE in the last 168 hours. No results for input(s): AMMONIA in the last 168 hours. CBC: Recent Labs  Lab 01/18/20 0532 01/18/20 0549  WBC 9.0  --   HGB 12.0 12.6  HCT 39.1 37.0  MCV 104.0*  --   PLT 450*  --    Cardiac Enzymes: No results for input(s): CKTOTAL, CKMB, CKMBINDEX, TROPONINI in the last 168 hours. BNP: Invalid input(s): POCBNP CBG: Recent Labs  Lab 01/21/20 0728 01/21/20 1127 01/21/20 1630 01/21/20 2150 01/22/20 0748  GLUCAP 78 123* 75 115* 90   D-Dimer No results for input(s): DDIMER in the last 72 hours. Hgb A1c No results for input(s): HGBA1C in the last 72 hours. Lipid Profile No results for input(s): CHOL, HDL, LDLCALC, TRIG, CHOLHDL, LDLDIRECT in the last 72 hours. Thyroid function studies No results for input(s): TSH, T4TOTAL, T3FREE, THYROIDAB in the last 72 hours.  Invalid input(s): FREET3 Anemia work up No results for input(s): VITAMINB12, FOLATE, FERRITIN, TIBC, IRON, RETICCTPCT in the last 72 hours. Urinalysis    Component Value Date/Time   COLORURINE YELLOW 01/01/2020 1733   APPEARANCEUR CLEAR 01/01/2020 1733   LABSPEC 1.014 01/01/2020 1733   PHURINE 5.0 01/01/2020 1733   GLUCOSEU NEGATIVE 01/01/2020 1733   HGBUR SMALL (A) 01/01/2020 1733   BILIRUBINUR  NEGATIVE 01/01/2020 [place] 01/01/2020 1733   PROTEINUR NEGATIVE 01/01/2020 1733   NITRITE NEGATIVE 01/01/2020 1733   LEUKOCYTESUR NEGATIVE 01/01/2020 1733   Sepsis Labs Invalid input(s): PROCALCITONIN,  WBC,  LACTICIDVEN Microbiology Recent Results (from the past 240 hour(s))  Respiratory Panel by RT PCR (Flu A&B, Covid) - Nasopharyngeal Swab     Status: None   Collection Time: 01/18/20  5:34 AM   Specimen: Nasopharyngeal Swab  Result Value Ref Range Status   SARS Coronavirus 2 by RT PCR NEGATIVE NEGATIVE Final  Comment: (NOTE) SARS-CoV-2 target nucleic acids are NOT DETECTED.  The SARS-CoV-2 RNA is generally detectable in upper respiratoy specimens during the acute phase of infection. The lowest concentration of SARS-CoV-2 viral copies this assay can detect is 131 copies/mL. A negative result does not preclude SARS-Cov-2 infection and should not be used as the sole basis for treatment or other patient management decisions. A negative result may occur with  improper specimen collection/handling, submission of specimen other than nasopharyngeal swab, presence of viral mutation(s) within the areas targeted by this assay, and inadequate number of viral copies (<131 copies/mL). A negative result must be combined with clinical observations, patient history, and epidemiological information. The expected result is Negative.  Fact Sheet for Patients:  PinkCheek.be  Fact Sheet for Healthcare Providers:  GravelBags.it  This test is no t yet approved or cleared by the [place] FDA and  has been authorized for detection and/or diagnosis of SARS-CoV-2 by FDA under an Emergency Use Authorization (EUA). This EUA will remain  in effect (meaning this test can be used) for the duration of the COVID-19 declaration under Section 564(b)(1) of the Act, 21 U.S.C. section 360bbb-3(b)(1), unless the authorization is  terminated or revoked sooner.     Influenza A by PCR NEGATIVE NEGATIVE Final   Influenza B by PCR NEGATIVE NEGATIVE Final    Comment: (NOTE) The Xpert Xpress SARS-CoV-2/FLU/RSV assay is intended as an aid in  the diagnosis of influenza from Nasopharyngeal swab specimens and  should not be used as a sole basis for treatment. Nasal washings and  aspirates are unacceptable for Xpert Xpress SARS-CoV-2/FLU/RSV  testing.  Fact Sheet for Patients: PinkCheek.be  Fact Sheet for Healthcare Providers: GravelBags.it  This test is not yet approved or cleared by the [place] FDA and  has been authorized for detection and/or diagnosis of SARS-CoV-2 by  FDA under an Emergency Use Authorization (EUA). This EUA will remain  in effect (meaning this test can be used) for the duration of the  Covid-19 declaration under Section 564(b)(1) of the Act, 21  U.S.C. section 360bbb-3(b)(1), unless the authorization is  terminated or revoked. Performed at [facility], [place] [address], [place], [place] [address]      Time coordinating discharge: Over 30 minutes  SIGNED:   Little Ishikawa, DO [facility] 01/22/2020, 11:26 AM Pager   If 7PM-7AM, please contact night-coverage [email or web address]

## 2020-01-23 ENCOUNTER — Telehealth: Payer: Self-pay | Admitting: Family Medicine

## 2020-01-23 ENCOUNTER — Other Ambulatory Visit: Payer: Self-pay

## 2020-01-23 ENCOUNTER — Other Ambulatory Visit: Payer: Medicare Other

## 2020-01-23 ENCOUNTER — Telehealth: Payer: Self-pay

## 2020-01-23 VITALS — BP 150/72 | HR 61 | Wt 128.6 lb

## 2020-01-23 DIAGNOSIS — I5033 Acute on chronic diastolic (congestive) heart failure: Secondary | ICD-10-CM | POA: Diagnosis not present

## 2020-01-23 DIAGNOSIS — E1122 Type 2 diabetes mellitus with diabetic chronic kidney disease: Secondary | ICD-10-CM | POA: Diagnosis not present

## 2020-01-23 DIAGNOSIS — Z515 Encounter for palliative care: Secondary | ICD-10-CM

## 2020-01-23 DIAGNOSIS — T451X5D Adverse effect of antineoplastic and immunosuppressive drugs, subsequent encounter: Secondary | ICD-10-CM | POA: Diagnosis not present

## 2020-01-23 DIAGNOSIS — F419 Anxiety disorder, unspecified: Secondary | ICD-10-CM | POA: Diagnosis not present

## 2020-01-23 DIAGNOSIS — Z87440 Personal history of urinary (tract) infections: Secondary | ICD-10-CM | POA: Diagnosis not present

## 2020-01-23 DIAGNOSIS — E1151 Type 2 diabetes mellitus with diabetic peripheral angiopathy without gangrene: Secondary | ICD-10-CM | POA: Diagnosis not present

## 2020-01-23 DIAGNOSIS — N183 Chronic kidney disease, stage 3 unspecified: Secondary | ICD-10-CM | POA: Diagnosis not present

## 2020-01-23 DIAGNOSIS — D631 Anemia in chronic kidney disease: Secondary | ICD-10-CM | POA: Diagnosis not present

## 2020-01-23 DIAGNOSIS — C786 Secondary malignant neoplasm of retroperitoneum and peritoneum: Secondary | ICD-10-CM | POA: Diagnosis not present

## 2020-01-23 DIAGNOSIS — E785 Hyperlipidemia, unspecified: Secondary | ICD-10-CM | POA: Diagnosis not present

## 2020-01-23 DIAGNOSIS — C221 Intrahepatic bile duct carcinoma: Secondary | ICD-10-CM | POA: Diagnosis not present

## 2020-01-23 DIAGNOSIS — R011 Cardiac murmur, unspecified: Secondary | ICD-10-CM | POA: Diagnosis not present

## 2020-01-23 DIAGNOSIS — F32A Depression, unspecified: Secondary | ICD-10-CM | POA: Diagnosis not present

## 2020-01-23 DIAGNOSIS — I700 Atherosclerosis of aorta: Secondary | ICD-10-CM | POA: Diagnosis not present

## 2020-01-23 DIAGNOSIS — Z9011 Acquired absence of right breast and nipple: Secondary | ICD-10-CM | POA: Diagnosis not present

## 2020-01-23 DIAGNOSIS — G620 Drug-induced polyneuropathy: Secondary | ICD-10-CM | POA: Diagnosis not present

## 2020-01-23 DIAGNOSIS — I161 Hypertensive emergency: Secondary | ICD-10-CM | POA: Diagnosis not present

## 2020-01-23 DIAGNOSIS — Z9089 Acquired absence of other organs: Secondary | ICD-10-CM | POA: Diagnosis not present

## 2020-01-23 DIAGNOSIS — K449 Diaphragmatic hernia without obstruction or gangrene: Secondary | ICD-10-CM | POA: Diagnosis not present

## 2020-01-23 DIAGNOSIS — K219 Gastro-esophageal reflux disease without esophagitis: Secondary | ICD-10-CM | POA: Diagnosis not present

## 2020-01-23 DIAGNOSIS — I110 Hypertensive heart disease with heart failure: Secondary | ICD-10-CM | POA: Diagnosis not present

## 2020-01-23 DIAGNOSIS — M1990 Unspecified osteoarthritis, unspecified site: Secondary | ICD-10-CM | POA: Diagnosis not present

## 2020-01-23 DIAGNOSIS — Z853 Personal history of malignant neoplasm of breast: Secondary | ICD-10-CM | POA: Diagnosis not present

## 2020-01-23 DIAGNOSIS — J9601 Acute respiratory failure with hypoxia: Secondary | ICD-10-CM | POA: Diagnosis not present

## 2020-01-23 DIAGNOSIS — Z8701 Personal history of pneumonia (recurrent): Secondary | ICD-10-CM | POA: Diagnosis not present

## 2020-01-23 NOTE — Telephone Encounter (Signed)
1020 am.  Phone call made to patient to schedule a follow up appointment after hospital discharge.  Patient is agreeable for today at 1 pm.

## 2020-01-23 NOTE — Telephone Encounter (Signed)
Donna May is calling and is requesting verbal orders for skilled nursing for once a week for 9 weeks. Ok to leave VM on [phone].

## 2020-01-23 NOTE — Progress Notes (Signed)
PATIENT NAME: Donna May DOB: 01/28/1941 MRN: [id number]  PRIMARY CARE PROVIDER: [name], MD  RESPONSIBLE PARTY:  Acct ID - Guarantor Home Phone Work Phone Relationship Acct Type  [id number] SHAYLEEN, EPPINGER* [phone]  Self P/F     [place], [place], [place] [address]    PLAN OF CARE and INTERVENTIONS:               1.  GOALS OF CARE/ ADVANCE CARE PLANNING: Reviewed current code status and patient desires partial code.  Reviewed the MOST form and patient is agreeable with everything except for the DNR status.  AD is present under Vynca.               2.  PATIENT/CAREGIVER EDUCATION:  Extensive education provided on CHF, weights, medication management, when to contact MD, and low sodium diet.               4. PERSONAL EMERGENCY PLAN:  Patient will activate 911 in the event of an emergency.               5.  DISEASE STATUS:  Greeted at the door by patient.  Noted unsteady gait and use of furniture to provide stability.  Patient was discharged from the hospital on yesterday due to CHF exacerbation.  Patient was seen by the [facility] nurse earlier today.  Patient states VS were obtained and they attempted to weight her but the scale would not work properly.  I provided education on use of the scale and to place it on the kitchen floor rather than the carpet.  Weight obtained.  There has been a 2 lb weight gain since yesterday at the hospital.  Patient requested that I open a package from [facility] that provides multiple teaching tools.  Patient and I discussed the low sodium diet and we reviewed the pamphlet that was in the packet.  We discussed reading food labels to check the sodium content and also foods to avoid.  Patient enjoys eating out frequently and is asking for assistance with foods that she can eat.  We discussed fresh fruits, veggies, fish and chicken options.  I advised checking on menus to see if they offer heart healthy options or asking the server for recommendations.  We  discussed the importance of weighing herself daily and placing the weight in her notebook that is provided by [facility].  I have shown patient the calender and placed weights from yesterday as documented in the hospital and also today's weight. We have reviewed the goals of maintaining her weight and when to contact her cardiologist for weight gain.  Patient has verbalized the importance of this and plans to weight herself daily and place weights in her notebook.  I have stressed the importance of checking her weight tomorrow and to contact cardiology if weight gain continues.  I have contacted the cardiology appointment to confirm she does have a follow up appointment this month.  Information is written on her weight book.  We have discussed medication management and the importance of taking her medications as directed.  We have also reviewed s/s of CHF.     HISTORY OF PRESENT ILLNESS:  79 year old female with dx of heart failure.  Patient is being followed by Palliative Care monthly and PRN.   CODE STATUS: Partial Code ADVANCED DIRECTIVES: Yes MOST FORM: Yes PPS: 60%   PHYSICAL EXAM:  VITALS: Today's Vitals   01/23/20 1317  BP: (!) 150/72  Pulse: 61  SpO2: 98%  Weight: 128 lb 9.6 oz (58.3 kg)    LUNGS: CTA  Breathing even and non-labored. CARDIAC: HRR EXTREMITIES: Right lower extremity with 1+ ankle edema and trace edema to left lower extremity. SKIN: warm and dry to touch.  No skin breakdown reported. NEURO: Alert and oriented x 3       Lorenza Burton, RN

## 2020-01-24 ENCOUNTER — Other Ambulatory Visit: Payer: Self-pay

## 2020-01-24 ENCOUNTER — Other Ambulatory Visit: Payer: Self-pay | Admitting: *Deleted

## 2020-01-24 ENCOUNTER — Telehealth: Payer: Self-pay | Admitting: Cardiovascular Disease

## 2020-01-24 DIAGNOSIS — I10 Essential (primary) hypertension: Secondary | ICD-10-CM

## 2020-01-24 DIAGNOSIS — I5033 Acute on chronic diastolic (congestive) heart failure: Secondary | ICD-10-CM

## 2020-01-24 MED ORDER — POTASSIUM CHLORIDE CRYS ER 20 MEQ PO TBCR: 20.0000 meq | EXTENDED_RELEASE_TABLET | Freq: Every day | ORAL | 3 refills | Status: DC

## 2020-01-24 NOTE — Telephone Encounter (Signed)
Spoke to [employer] RN with [facility] she was calling to make Dr.[name] aware when she saw patient she was not taking medications as directed on discharge instructions.Patient is taking Lasix 40 mg daily.She advised patient to take Kdur 10 meq daily.She is taking Hydralazine 100 mg three times a day.She stopped taking HCTZ and Metoprolol.She is taking Carvedilol 25 mg twice a day.She wanted to ask Dr.[name] if patient needed a bmet next week and if she needed to be seen before 02/07/20.Stated she is trying to help patient from being admitted to hospital again.Message sent to Dr.[name] for advice.

## 2020-01-24 NOTE — Telephone Encounter (Signed)
Please advise ok for verbal orders

## 2020-01-24 NOTE — Telephone Encounter (Signed)
[name], NP, states the patient had her 3rd admission for heart failure. She states there are some medication discrepancies. She states the patient's discharge states she is to take lasix and potassium twice a week, but her paper states to take lasix every day 40 mg, which makes more sense. She states if the patient is taking lasix every day then she should be taking potassium everyday as well. Her last creatine was on 11/8 and was 1.69. She states the patient is scheduled to see Dr. Sallyanne Kuster 02/07/2020, but she may need to be seen sooner. She states there were other medications changed as well. She states they increased her hydralazin from 50 mg to 100 mg 3 times a day. They stopped her hctz and metoprolol. She states they started her on carvedilol 25 mg twice a day. Please advise.

## 2020-01-24 NOTE — Patient Outreach (Signed)
[facility]) Care Management  01/24/2020  Donna May 24-Feb-1941 [id number]  Incoming call from Mrs. Donna May. She is PAC for HF which is her 3rd episode/hospitalization for same.  She reports many medication changes and discrepancies about her furosemide and potassium schedule.  She is scheduled for follow up with Dr. Tommye May on 11/22, Oncology 11/24. She does not have an appt with her primary care.  Patient was recently discharged from hospital and all medications have been reviewed. Outpatient Encounter Medications as of 01/24/2020  Medication Sig Note  . amLODipine (NORVASC) 10 MG tablet Take 1 tablet (10 mg total) by mouth daily.   Marland Kitchen aspirin 81 MG tablet Take 81 mg by mouth daily.    . carvedilol (COREG) 25 MG tablet Take 1 tablet (25 mg total) by mouth 2 (two) times daily with a meal.   . furosemide (LASIX) 40 MG tablet Take 1 tablet (40 mg total) by mouth daily. 01/24/2020: Error - pt instructions are to take daily.  Marland Kitchen glucose blood test strip 1 each by Other route 2 (two) times daily. Use Onetouch verio test strips as instructed to check blood sugar twice daily.   . hydrALAZINE (APRESOLINE) 100 MG tablet Take 1 tablet (100 mg total) by mouth every 8 (eight) hours. 01/24/2020: Dose increased while in the hospital.  . lansoprazole (PREVACID) 15 MG capsule Take 1 capsule (15 mg total) by mouth daily.   Marland Kitchen loratadine (CLARITIN) 10 MG tablet Take 10 mg by mouth daily as needed for allergies.   . Omega 3 1000 MG CAPS Take 2,000 mg by mouth daily.    . potassium chloride (KLOR-CON) 10 MEQ tablet Take 1 tablet (10 mEq total) by mouth 2 (two) times a week. (Patient taking differently: Take 10 mEq by mouth daily. Take 1 tablet (23mEq) by mouth on Tuesday,Thursdays, Saturday, and Sunday) 01/24/2020: Her instructions say take this twice a week.   . sitaGLIPtin (JANUVIA) 100 MG tablet Take 1 tablet (100 mg total) by mouth daily.   . valsartan (DIOVAN) 320 MG tablet Take 1  tablet (320 mg total) by mouth daily.    Facility-Administered Encounter Medications as of 01/24/2020  Medication  . triamcinolone acetonide (KENALOG) 10 MG/ML injection 10 mg   NOTE: HYDRALAZINE INCREASED TO 100 MG TID,              HCTZ AND METOPROLOL STOPPED.              STARTED CARVEDILOL 25 MG BID              NEED TO VERIFY CARIOLOGY DIRECTIONS FOR FUROSEMIDE AND POTASSIUM   LAST CREATININE 1.69.  PLAN:  WILL CALL DR. Sallyanne May FOR ADVISEMENT ON MEDICATION DISCREPANCIES AND PERHAPS EARLIER F/U APPT THAN 02/05/20.  WILL CALL PT IN ONE WEEK.  [name]. [name], MSN, [facility] Gerontological Nurse Practitioner [facility] Care Management [phone]

## 2020-01-24 NOTE — Telephone Encounter (Signed)
Returned call to [employer] [facility].

## 2020-01-24 NOTE — Telephone Encounter (Signed)
Carvedilol, hydralazine are correct doses and metoprolol and HCTZ are indeed stopped. Please confirm still on valsartan and amlodipine as well. Please have her take Furosemide 40 mg daily and KCl 20 mEq daily and check a BMET on 11/15. Weigh daily and please call if weight reaches 5 lb above DC weight. Keep appt 11/24. Thanks.

## 2020-01-24 NOTE — Telephone Encounter (Signed)
Spoke to [employer] RN with [facility] Dr.[name]'s recommendation given.Stated patient is taking valsartan and amlodipine.  Spoke to patient Dr.[name]'s recommendation given.Stated she is taking valsartan and amlodipine.Advised to increase Kdur to 20 meq daily.New prescription sent to pharmacy.Advised to have bmet 11/15.Order placed.Advised to weigh daily and call office if she gains 3 lbs overnight or 5 lbs in 1 week.Advised to bring log of weights to appointment scheduled with Coletta Memos NP 11/24 at 9:15 am.

## 2020-01-26 DIAGNOSIS — E1122 Type 2 diabetes mellitus with diabetic chronic kidney disease: Secondary | ICD-10-CM | POA: Diagnosis not present

## 2020-01-26 DIAGNOSIS — I161 Hypertensive emergency: Secondary | ICD-10-CM | POA: Diagnosis not present

## 2020-01-26 DIAGNOSIS — J9601 Acute respiratory failure with hypoxia: Secondary | ICD-10-CM | POA: Diagnosis not present

## 2020-01-26 DIAGNOSIS — I5033 Acute on chronic diastolic (congestive) heart failure: Secondary | ICD-10-CM | POA: Diagnosis not present

## 2020-01-26 DIAGNOSIS — I110 Hypertensive heart disease with heart failure: Secondary | ICD-10-CM | POA: Diagnosis not present

## 2020-01-26 DIAGNOSIS — N183 Chronic kidney disease, stage 3 unspecified: Secondary | ICD-10-CM | POA: Diagnosis not present

## 2020-01-26 NOTE — Telephone Encounter (Signed)
Donna May from [facility]  She needs verbal orders for PT for 1 time a week for 6 weeks.  Please advise

## 2020-01-26 NOTE — Telephone Encounter (Signed)
Message already sent to Dr Volanda Napoleon for review

## 2020-01-26 NOTE — Telephone Encounter (Signed)
[name] from [facility]  Needs verbal orders for skilled nursing  1 times a week for 9 weeks

## 2020-01-29 ENCOUNTER — Other Ambulatory Visit: Payer: Self-pay | Admitting: *Deleted

## 2020-01-29 DIAGNOSIS — I10 Essential (primary) hypertension: Secondary | ICD-10-CM | POA: Diagnosis not present

## 2020-01-29 DIAGNOSIS — I5033 Acute on chronic diastolic (congestive) heart failure: Secondary | ICD-10-CM | POA: Diagnosis not present

## 2020-01-29 LAB — BASIC METABOLIC PANEL
BUN/Creatinine Ratio: 21 (ref 12–28)
BUN: 37 mg/dL — ABNORMAL HIGH (ref 8–27)
CO2: 22 mmol/L (ref 20–29)
Calcium: 8.7 mg/dL (ref 8.7–10.3)
Chloride: 106 mmol/L (ref 96–106)
Creatinine, Ser: 1.8 mg/dL — ABNORMAL HIGH (ref 0.57–1.00)
GFR calc Af Amer: 30 mL/min/{1.73_m2} — ABNORMAL LOW (ref >59)
GFR calc non Af Amer: 26 mL/min/{1.73_m2} — ABNORMAL LOW (ref >59)
Glucose: 85 mg/dL (ref 65–99)
Potassium: 5.2 mmol/L (ref 3.5–5.2)
Sodium: 141 mmol/L (ref 134–144)

## 2020-01-29 NOTE — Patient Outreach (Signed)
[facility]) Care Management  01/29/2020  Donna May 26-Aug-1940 [id number]  Telephone outreach for HF follow up.  Unsuccessful, left message and requested a return call.  [name]. [name], MSN, [facility] Gerontological Nurse Practitioner [facility] Care Management [phone]

## 2020-01-30 ENCOUNTER — Telehealth: Payer: Self-pay | Admitting: *Deleted

## 2020-01-30 ENCOUNTER — Other Ambulatory Visit: Payer: Self-pay | Admitting: *Deleted

## 2020-01-30 DIAGNOSIS — E1122 Type 2 diabetes mellitus with diabetic chronic kidney disease: Secondary | ICD-10-CM | POA: Diagnosis not present

## 2020-01-30 DIAGNOSIS — I161 Hypertensive emergency: Secondary | ICD-10-CM | POA: Diagnosis not present

## 2020-01-30 DIAGNOSIS — I5033 Acute on chronic diastolic (congestive) heart failure: Secondary | ICD-10-CM | POA: Diagnosis not present

## 2020-01-30 DIAGNOSIS — N183 Chronic kidney disease, stage 3 unspecified: Secondary | ICD-10-CM | POA: Diagnosis not present

## 2020-01-30 DIAGNOSIS — I110 Hypertensive heart disease with heart failure: Secondary | ICD-10-CM | POA: Diagnosis not present

## 2020-01-30 DIAGNOSIS — J9601 Acute respiratory failure with hypoxia: Secondary | ICD-10-CM | POA: Diagnosis not present

## 2020-01-30 MED ORDER — POTASSIUM CHLORIDE CRYS ER 10 MEQ PO TBCR
10.0000 meq | EXTENDED_RELEASE_TABLET | Freq: Every day | ORAL | Status: DC
Start: 2020-01-30 — End: 2020-03-04

## 2020-01-30 NOTE — Patient Outreach (Signed)
[facility]) Care Management  01/30/2020  FANNIE GATHRIGHT 11/13/1940 [id number]   Incoming call from Mrs. Hines. She reports she is doing OK except for edema. She says her swelling is less than it has been but still so much it is hard to get her shoes on. She does not have any support stockings. She does elevate her legs during the day when she lies down to rest.   We discussed getting some stockings and NP offered to order 2 pairs for her and she will reimburse NP for this cost. Pt agreed to this arrangement.  Discussed and encouraged her to follow HF action plan through the holiday season which is a time when many people get in trouble. Emphasized importance of avoiding food that has added salt. Advised pt to tell her family that she cannot have salt due to her heart condition.  If wt jumps up she will know why and she needs to call if her wt goes up 3-5 pounds for medication adjustment instructions.  She has agreed to a follow up call the week after Thanksgiving.  [name]. [name], MSN, [facility] Gerontological Nurse Practitioner [facility] Care Management [phone]

## 2020-01-30 NOTE — Telephone Encounter (Signed)
Patient made aware of results and verbalized understanding.  

## 2020-01-30 NOTE — Telephone Encounter (Signed)
-----   Message from Sanda Klein, MD sent at 01/30/2020  8:39 AM EST ----- K is high normal, please decrease KCl back to 10 mEq daily. Creatinine is slightly higher than her baseline, but I think it is best to continue same furosemide dose until her 11/24 follow up w Denyse Amass, when we should check BMET again.

## 2020-01-30 NOTE — Telephone Encounter (Signed)
Please advise ok for Verbal orders

## 2020-01-31 NOTE — Telephone Encounter (Signed)
ok 

## 2020-01-31 NOTE — Patient Outreach (Signed)
[facility]) Care Management  01/31/2020  Donna May June 20, 1940  Incoming call from pt to respond to NP outreach.  Mrs. Raabe reports she does not have any questions or problems to address. Educated about answering [employer] automated calls to indicate anything that is different and that she needs to have a nurse address. Offered to have calls terminated as NP is calling her every week. She requests she would like to receive the Emmi calls in addition to NP calling.  She reports her wt is stable, no increased SOB.  MD has reported her lab results to her and asked her to reduce her potassium to 10 meq and continue her furosemide as ordered.  We agree to talk again after the Thanksgiving holiday.  [name]. [name], MSN, [facility] Gerontological Nurse Practitioner [facility] Care Management [phone]

## 2020-02-01 NOTE — Progress Notes (Signed)
Cardiology Clinic Note   Patient Name: Donna May Date of Encounter: 02/07/2020  Primary Care Provider:  [name], MD Primary Cardiologist:  Sanda Klein, MD  Patient Profile    Donna May 79 year old female presents the clinic today for follow-up evaluation of her acute diastolic heart failure and hypertension.  Past Medical History    Past Medical History:  Diagnosis Date  . Anemia   . Anxiety   . Arthritis   . Breast cancer ([place]) 1992  . Cataract    Bilateral eyes - surgery to remove  . Chronic diastolic CHF (congestive heart failure) ([place])   . Chronic kidney disease    CKD stage 3  . Complication of anesthesia    "something they use make me itch for a couple of days."  . Depression   . Diabetes mellitus    type 2  . Duodenitis 01/18/2002  . Fainting spell   . GERD (gastroesophageal reflux disease)   . Heart murmur    never has caused any problems  . Hiatal hernia 08/08/2008, 01/18/2002  . History of pneumonia    x 2  . Hyperlipidemia   . Hypertension   . Liver cancer ([place]) 08/2018  . Lymphedema 2017   Right arm   Past Surgical History:  Procedure Laterality Date  . AXILLARY SURGERY     cyst removal, right  . COLONOSCOPY  09/23/2018   Dr. Havery Moros - polyps  . EYE SURGERY Bilateral    cataracts to remove  . LAPAROSCOPY N/A 11/17/2018   Procedure: LAPAROSCOPY DIAGNOSTIC, INTRAOPERATIVE ULTRASOUND, PERITONEAL BIOPSIES;  Surgeon: [name], MD;  Location: [facility];  Service: General;  Laterality: N/A;  GENERAL AND EPIDURAL  . LIVER BIOPSY  08/2018   Dr. Lindwood Coke  . MASTECTOMY  1992   right, with flap  . NM MYOCAR PERF WALL MOTION  06/11/2009   Protocol:[place], post stress EF58%, EKG negative for ischemia, low risk  . PORTACATH PLACEMENT N/A 12/02/2018   Procedure: INSERTION PORT-A-CATH;  Surgeon: [name], MD;  Location: [facility];  Service: General;  Laterality: N/A;  . RECONSTRUCTION BREAST W/ TRAM FLAP Right   . TONSILLECTOMY    .  TRANSTHORACIC ECHOCARDIOGRAM  12/24/2009   LVEF =>55%, normal study  . 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  She was admitted to the hospital 01/18/2020 until 01/22/2020.  She was diagnosed with acute on chronic diastolic CHF, diabetes mellitus with complications of stage III CKD and hypertension.  She presented to the emergency department via EMS for evaluation of sudden onset of shortness of breath that had been present for approximately 6 hours.  When EMS arrived at her place of residence patient had a oxygen saturation of around 70%.  She was placed on nonrebreather but was unable to tolerate it.  She was placed on 3 L of oxygen which improved her O2 saturation to 90%.  In the emergency department her blood pressure was evaluated and read 230/150.  Respiratory rate is in the 40s and her heart rate was 100 bpm.  She was placed on noninvasive mechanical ventilation.  She denied chest pain, nausea, vomiting, diaphoresis and abdominal pain.  She denied urinary symptoms.  Her respiratory viral panel was negative her chest x-ray showed cardiomegaly with diffuse bilateral pulmonary interstitial edema.  EKG showed sinus rhythm with left atrial enlargement.  She received IV diuresis.  Her blood pressure remained high.  At the initiation of her home blood pressure medications blood pressure substantially improved.  Her hydralazine was increased to 100 mg 3 times daily, her metoprolol was transitioned to carvedilol.  Low-salt diet was discussed, volume and fluid restriction.  She admitted to dietary indiscretion eating fast food fairly frequently which  was felt to be a major contributing factor to her heart failure exacerbation.  It was recommended that she have home health PT due to her ambulatory dysfunction and deconditioning.  She presents the clinic today for follow-up evaluation and states she feels fairly well.  She states she does have some neuropathy in her hands and her feet from cancer treatment.  She has been taking gabapentin which she does not like because it makes her tired.  She has been trying her best to stay away from higher sodium food options.  We reviewed daily weights and I expressed that I would like her to call the office with a weight increase of 3 pounds overnight or 5 pounds in a week.  She expressed understanding.  I will give her the salty 6 diet sheet, have her increase her physical activity as tolerated, and follow-up in 3 months.  Today she denies chest pain, shortness of breath, lower extremity edema, fatigue, palpitations, melena, hematuria, hemoptysis, diaphoresis, weakness, presyncope, syncope, orthopnea, and PND.   Home Medications    Prior to Admission medications   Medication Sig Start Date End Date Taking? Authorizing Provider  amLODipine (NORVASC) 10 MG tablet Take 1 tablet (10 mg total) by mouth daily. 10/22/19   Danford, Suann Larry, MD  aspirin 81 MG tablet Take 81 mg by mouth daily.     [provider]  carvedilol (COREG) 25 MG tablet Take 1 tablet (25 mg total) by mouth 2 (two) times daily with a meal. 01/22/20   Little Ishikawa, MD  furosemide (LASIX) 40 MG tablet Take 1 tablet (40 mg total) by mouth daily. 01/23/20   Little Ishikawa, MD  glucose blood test strip 1 each by Other route 2 (two) times daily. Use Onetouch verio test strips as instructed to check blood sugar twice daily. 01/05/19   Elayne Snare, MD  hydrALAZINE (APRESOLINE) 100 MG tablet Take 1 tablet (100 mg total) by mouth every 8 (eight) hours. 01/22/20   Little Ishikawa, MD  lansoprazole (PREVACID) 15 MG capsule  Take 1 capsule (15 mg total) by mouth daily. 06/08/19   [name], [name], MD  loratadine (CLARITIN) 10 MG tablet Take 10 mg by mouth daily as needed for allergies.    [provider]  Omega 3 1000 MG CAPS Take 2,000 mg by mouth daily.     [provider]  potassium chloride SA (KLOR-CON) 10 MEQ tablet Take 1 tablet (10 mEq total) by mouth daily. 01/30/20   [name], Mihai, MD  sitaGLIPtin (JANUVIA) 100 MG tablet Take 1 tablet (100 mg total) by mouth daily. 01/20/19   Elayne Snare, MD  valsartan (DIOVAN) 320 MG tablet Take 1 tablet (320 mg total) by mouth daily. 10/22/19   Danford, Suann Larry, MD    Family History    Family History  Problem Relation Age of Onset  . Breast cancer Cousin        diagnosed >50;  mother's first cousins  . Diabetes Brother   . Heart disease Mother   . Hypertension Mother   . Heart disease Father   . Hypertension Father   . Prostate cancer Brother 38  . Heart attack Maternal Grandmother   . Stroke Maternal Grandfather   . Colon cancer Neg Hx   . Esophageal cancer Neg Hx   . Stomach cancer Neg Hx   . Rectal cancer Neg Hx    She indicated that her mother is deceased. She indicated that her father is deceased. She indicated that only one of her two brothers is alive. She indicated that her maternal grandmother is deceased. She indicated that her maternal grandfather is deceased. She indicated that her paternal grandmother is deceased. She indicated that her paternal grandfather is deceased. She indicated that her maternal uncle is deceased. She indicated that her paternal aunt is deceased. She indicated that her paternal uncle is deceased. She indicated that the status of her cousin is unknown. She indicated that the status of her neg hx is unknown.  Social History    Social History   Socioeconomic History  . Marital status: Single    Spouse name: Not on file  . Number of children: 2  . Years of education: Not on file  . Highest  education level: Not on file  Occupational History  . Occupation: retired  Tobacco Use  . Smoking status: Former Smoker    Packs/day: 0.25    Years: 25.00    Pack years: 6.25    Types: Cigarettes    Quit date: 1990    Years since quitting: 31.9  . Smokeless tobacco: Never Used  Vaping Use  . Vaping Use: Never used  Substance and Sexual Activity  . Alcohol use: Not Currently    Alcohol/week: 1.0 standard drink    Types: 1 Glasses of wine per week    Comment: occasional wine  . Drug use: No  . Sexual activity: Yes    Partners: Male    Birth control/protection: Condom, Post-menopausal    Comment: First sexual encounter age 83. Fewer than 5 partners in life time.  Other Topics Concern  . 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   Social Determinants of Health   Financial Resource Strain:   . Difficulty of Paying Living Expenses: Not on file  Food Insecurity: No Food Insecurity  . Worried About Charity fundraiser in the Last Year: Never true  . Ran Out of Food in the Last Year: Never true  Transportation Needs:   . Lack of Transportation (Medical): Not on file  . Lack of Transportation (Non-Medical): Not on file  Physical Activity:   . Days of Exercise per Week: Not on file  . Minutes of Exercise per Session: Not on file  Stress:   . Feeling of Stress : Not on file  Social Connections:   . Frequency of Communication with Friends and Family: Not on file  . Frequency of Social Gatherings with Friends and Family: Not on file  . Attends Religious Services: Not on file  . Active Member of Clubs or Organizations: Not on file  . Attends Archivist Meetings: Not on file  . Marital Status:  Not on file  Intimate Partner Violence:   . Fear of  Current or Ex-Partner: Not on file  . Emotionally Abused: Not on file  . Physically Abused: Not on file  . Sexually Abused: Not on file     Review of Systems    General:  No chills, fever, night sweats or weight changes.  Cardiovascular:  No chest pain, dyspnea on exertion, edema, orthopnea, palpitations, paroxysmal nocturnal dyspnea. Dermatological: No rash, lesions/masses Respiratory: No cough, dyspnea Urologic: No hematuria, dysuria Abdominal:   No nausea, vomiting, diarrhea, bright red blood per rectum, melena, or hematemesis Neurologic:  No visual changes, wkns, changes in mental status. All other systems reviewed and are otherwise negative except as noted above.  Physical Exam    VS:  BP (!) 138/52   Pulse 76   Ht 5\' 5"  (1.651 m)   Wt 123 lb 6.4 oz (56 kg)   LMP  (LMP Unknown)   SpO2 97%   BMI 20.53 kg/m  , BMI Body mass index is 20.53 kg/m. GEN: Well nourished, well developed, in no acute distress. HEENT: normal. Neck: Supple, no JVD, carotid bruits, or masses. Cardiac: RRR, no murmurs, rubs, or gallops. No clubbing, cyanosis, edema.  Radials/DP/PT 2+ and equal bilaterally.  Respiratory:  Respirations regular and unlabored, clear to auscultation bilaterally. GI: Soft, nontender, nondistended, BS + x 4. MS: no deformity or atrophy. Skin: warm and dry, no rash. Neuro:  Strength and sensation are intact. Psych: Normal affect.  Accessory Clinical Findings    Recent Labs: 01/18/2020: B Natriuretic Peptide 1,816.9 02/05/2020: ALT 7; BUN 42; Creatinine 1.70; Hemoglobin 9.6; Magnesium 2.6; Platelet Count 222; Potassium 4.8; Sodium 140   Recent Lipid Panel    Component Value Date/Time   CHOL 190 09/01/2018 1435   CHOL 209 (H) 10/07/2017 1251   TRIG 46.0 09/01/2018 1435   HDL 60.50 09/01/2018 1435   HDL 63 10/07/2017 1251   CHOLHDL 3 09/01/2018 1435   VLDL 9.2 09/01/2018 1435   LDLCALC 120 (H) 09/01/2018 1435   LDLCALC 133 (H) 10/07/2017 1251    ECG personally  reviewed by me today-none today.  Echocardiogram 01/03/2020 IMPRESSIONS    1. Left ventricular ejection fraction, by estimation, is 60 to 65%. The  left ventricle has normal function. The left ventricle has no regional  wall motion abnormalities. There is moderate concentric left ventricular  hypertrophy. Left ventricular  diastolic parameters are consistent with Grade III diastolic dysfunction  (restrictive).  2. Right ventricular systolic function is normal. The right ventricular  size is normal. There is severely elevated pulmonary artery systolic  pressure.  3. Left atrial size was severely dilated.  4. Right atrial size was moderately dilated.  5. A small pericardial effusion is present. The pericardial effusion is  circumferential.  6. The mitral valve is normal in structure. Mild mitral valve  regurgitation.  7. Tricuspid valve regurgitation is moderate.  8. The aortic valve is tricuspid. Aortic valve regurgitation is not  visualized. No aortic stenosis is present.  9. The inferior vena cava is dilated in size with >50% respiratory  variability, suggesting right atrial pressure of 8 mmHg.   Comparison(s): No prior Echocardiogram.   Assessment & Plan   1.  Acute on chronic diastolic CHF-euvolemic today.  No increased DOE or activity intolerance.  Echocardiogram 01/03/2020 showed an EF of 60-65%, G3 DD.  BMP 02/05/2020 was stable.  Continue carvedilol Heart healthy low-sodium diet-salty 6 given Increase physical activity as tolerated Daily weights-contact office with  a weight increase of 3 pounds overnight or 5 pounds in a week Lower extremity support stockings-[place] sheet given Elevate lower extremities when not active  Hypertensive urgency-BP today 138/52.  Much better control at home. Continue amlodipine, hydralazine, irbesartan, carvedilol Heart healthy low-sodium diet-salty 6 given Increase physical activity as tolerated  Bp- log given  Demand  ischemia-no chest pain today.  Had elevated and flat high-sensitivity troponins 53-45.  EKG showed no acute ischemic changes.  No plans for ischemic evaluation were discussed.  Hyperlipidemia-LDL 120 6/20.  Has been off of statin therapy since starting chemotherapy. Heart healthy low-sodium high-fiber diet Increase physical activity as tolerated Repeat lipid panel  Metastatic cholangiocarcinoma-continues not cardiotoxic therapy in the form of gemctitabine.  Not felt to be a candidate for resection and given that overall poor prognosis. Follows with oncology  Disposition: Follow-up with Dr. Sallyanne Kuster in 3 months.   Jossie Ng. Jerick Khachatryan NP-C    02/07/2020, 9:18 AM [address] Office [phone] Fax [phone]  Notice: This dictation was prepared with Dragon dictation along with smaller phrase technology. Any transcriptional errors that result from this process are unintentional and may not be corrected upon review.

## 2020-02-01 NOTE — Telephone Encounter (Signed)
Left detailed message for [facility] with [facility] regarding pt approved orders as requested, advised to call the office with any questions

## 2020-02-04 NOTE — Progress Notes (Signed)
[place]   [phone] [phone]   Clinic Follow up Note   Patient Care Team: [name], MD as PCP - General (Family Medicine) [name], [name], MD as PCP - Cardiology (Cardiology) [name], [name], MD as Consulting Physician (Cardiology) [name], MD as Consulting Physician (Obstetrics and Gynecology) [name], [name], NP as Nurse Practitioner (Hematology and Oncology) [facility] [name], MD as Consulting Physician (Hematology) [name], [name], MD as Consulting Physician (Gastroenterology) Virgina Evener, Dawn, RN (Inactive) as Oncology Nurse Navigator [name], MD as Consulting Physician (General Surgery) [name], NP as [place] [facility] 02/05/2020  CHIEF COMPLAINT: F/u cholangiocarcinoma, hospital f/u  SUMMARY OF ONCOLOGIC HISTORY: Oncology History Overview Note  Cancer Staging Intrahepatic cholangiocarcinoma ([place]) Staging form: Intrahepatic Bile Duct, AJCC 8th Edition - Clinical stage from 09/23/2018: Stage IB (cT1b, cN0, cM0) - Signed by [name], MD on 10/06/2018    Malignant neoplasm of female breast ([place])  11/06/2018 Genetic Testing   Negative genetic testing on the [facility] Common Hereditary Cancers panel. A variant of uncertain significance was identified in one of her APC genes, called c.1243G>A (p.Ala415Thr).  The Common Hereditary Cancers Panel offered by [facility] includes sequencing and/or deletion duplication testing of the following 48 genes: APC, ATM, AXIN2, BARD1, BMPR1A, BRCA1, BRCA2, BRIP1, CDH1, CDK4, CDKN2A (p14ARF), CDKN2A (p16INK4a), CHEK2, CTNNA1, DICER1, EPCAM (Deletion/duplication testing only), GREM1 (promoter region deletion/duplication testing only), KIT, MEN1, MLH1, MSH2, MSH3, MSH6, MUTYH, NBN, NF1, NHTL1, PALB2, PDGFRA, PMS2, POLD1, POLE, PTEN, RAD50, RAD51C, RAD51D, RNF43, SDHB, SDHC, SDHD, SMAD4, SMARCA4. STK11, TP53, TSC1, TSC2, and  VHL.  The following genes were evaluated for sequence changes only: SDHA and HOXB13 c.251G>A variant only.    Intrahepatic cholangiocarcinoma ([name])  09/07/2018 Imaging   CT Chest IMPRESSION: 1. New, enhancing mass involving segment 4 of the liver and fundus of gallbladder is concerning for malignancy. This may represent either metastatic disease from breast cancer or neoplasm primary to the liver or hepatic biliary tree. Further evaluation with contrast enhanced CT of the abdomen and pelvis is recommended. 2. No findings to suggest metastatic disease within the chest. 3.  Aortic Atherosclerosis (ICD10-I70.0). 4. Coronary artery calcifications.   09/13/2018 Pathology Results   Diagnosis Liver, needle/core biopsy - ADENOCARCINOMA. Microscopic Comment Immunohistochemistry for CK7 is positive. CK20, TTF1, CDX-2, GATA-3, PAX 8, Qualitative ER, p63 and CK5/6 are negative. The provided clinical history of remote mammary carcinoma is noted. Based on the morphology and immunophenotype of the adenocarcinoma observed in this specimen, primary cholangiocarcinoma is favored. Clinical and radiologic correlation are  encouraged. Results reported to [employer] on 09/15/2018. Intradepartmental consultation (Dr. Vic Ripper).   09/13/2018 Initial Diagnosis   Cholangiocarcinoma ([place])   09/23/2018 Procedure   Colonoscopy by Dr. Havery Moros 09/23/18  IMPRESSION - Two 3 to 4 mm polyps in the ascending colon, removed with a cold snare. Resected and retrieved. - Five 3 to 5 mm polyps in the transverse colon, removed with a cold snare. Resected and retrieved. - One 5 mm polyp at the splenic flexure, removed with a cold snare. Resected and retrieved. - Three 3 to 5 mm polyps in the sigmoid colon, removed with a cold snare. Resected and retrieved. - The examination was otherwise normal. Upper Endopscy by Dr. Havery Moros 09/23/18  IMPRESSION - Esophagogastric landmarks identified. - 2 cm hiatal hernia. - Normal  esophagus otherwise. - A single gastric polyp. Resected and retrieved. - Mild gastritis. Biopsied. - Normal duodenal bulb and second portion of the  duodenum.   09/23/2018 Pathology Results   Diagnosis 09/23/18 1. Surgical [P], duodenum - BENIGN SMALL BOWEL MUCOSA. - NO ACTIVE INFLAMMATION OR VILLOUS ATROPHY IDENTIFIED. 2. Surgical [P], stomach, polyp - HYPERPLASTIC POLYP(S). - THERE IS NO EVIDENCE OF MALIGNANCY. 3. Surgical [P], gastric antrum and gastric body - CHRONIC INACTIVE GASTRITIS. - THERE IS NO EVIDENCE OF HELICOBACTER-PYLORI, DYSPLASIA, OR MALIGNANCY. - SEE COMMENT. 4. Surgical [P], colon, sigmoid, splenic flexure, transverse and ascending, polyp (9) - TUBULAR ADENOMA(S). - SESSILE SERRATED POLYP WITHOUT CYTOLOGIC DYSPLASIA. - HIGH GRADE DYSPLASIA IS NOT IDENTIFIED. 5. Surgical [P], colon, sigmoid, polyp (2) - HYPERPLASTIC POLYP(S). - THERE IS NO EVIDENCE OF MALIGNANCY.   09/23/2018 Cancer Staging   Staging form: Intrahepatic Bile Duct, AJCC 8th Edition - Clinical stage from 09/23/2018: Stage IB (cT1b, cN0, cM0) - Signed by [name], MD on 10/06/2018   09/29/2018 PET scan   PET 09/29/18 IMPRESSION: 1. Hypermetabolic mass in the RIGHT hepatic lobe consistent with biopsy proven adenocarcinoma. No additional liver metastasis. 2. No evidence of local breast cancer recurrence in the RIGHT breast or RIGHT axilla. 3. Mild bilateral hypermetabolic adrenal glands is favored benign hyperplasia. 4. No evidence of additional metastatic disease on skull base to thigh FDG PET scan.   11/06/2018 Genetic Testing   Negative genetic testing on the [facility] Common Hereditary Cancers panel. A variant of uncertain significance was identified in one of her APC genes, called c.1243G>A (p.Ala415Thr).  The Common Hereditary Cancers Panel offered by [facility] includes sequencing and/or deletion duplication testing of the following 48 genes: APC, ATM, AXIN2, BARD1, BMPR1A, BRCA1, BRCA2, BRIP1, CDH1,  CDK4, CDKN2A (p14ARF), CDKN2A (p16INK4a), CHEK2, CTNNA1, DICER1, EPCAM (Deletion/duplication testing only), GREM1 (promoter region deletion/duplication testing only), KIT, MEN1, MLH1, MSH2, MSH3, MSH6, MUTYH, NBN, NF1, NHTL1, PALB2, PDGFRA, PMS2, POLD1, POLE, PTEN, RAD50, RAD51C, RAD51D, RNF43, SDHB, SDHC, SDHD, SMAD4, SMARCA4. STK11, TP53, TSC1, TSC2, and VHL.  The following genes were evaluated for sequence changes only: SDHA and HOXB13 c.251G>A variant only.    11/17/2018 Pathology Results   Diagnosis 11/17/18 1. Soft tissue, biopsy, Diaphragmatic nodules - METASTATIC ADENOCARCINOMA, CONSISTENT WITH PATIENT'S CLINICAL HISTORY OF CHOLANGIOCARCINOMA. SEE NOTE 2. Liver, biopsy, Left - LIVER PARENCHYMA WITH A BENIGN FIBROTIC NODULE - NO EVIDENCE OF MALIGNANCY 3. Stomach, biopsy - BENIGN PAPILLARY MESOTHELIAL HYPERPLASIA - NO EVIDENCE OF MALIGNANCY   11/29/2018 Imaging   CT CAP WO Contrast  IMPRESSION: 1. Dominant liver mass appears grossly stable from 09/29/2018. Additional liver lesions are too small to characterize but were not shown to be hypermetabolic on PET. 2. Mild nodularity of both adrenal glands with associated hypermetabolism on 09/29/2018. Continued attention on follow-up exams is warranted. 3. Small right lower lobe nodules, stable from 09/07/2018. Again, attention on follow-up is recommended. 4. Trace bilateral pleural fluid. 5. Aortic atherosclerosis (ICD10-170.0). Coronary artery calcification. 6. Enlarged pulmonic trunk, indicative of pulmonary arterial hypertension.     11/30/2018 - 06/14/2019 Chemotherapy   First line chemo Oxaliplatin and gemcitabine q2weeks starting 11/30/18. Stopped Oxaliplatin on 05/03/19 due to worsening neuropathy. Added Cisplatin with C13 on 05/17/19 and stopped on C15 06/14/19 due to neuropathy. Reduced to maintenance single agent Gemcitabine on 06/28/19.    01/20/2019 Imaging   CT CAP IMPRESSION: restaging  1. Mild interval increase in size of  dominant liver mass involving segment 4 and segment 5. 2. No significant or progressive adrenal nodularity identified to suggest metastatic disease. 3. Unchanged appearance of small right lower lobe and lingular lung nodules, nonspecific.   03/21/2019 PET scan  IMPRESSION: 1. Large right hepatic mass with SUV uptake near background hepatic activity, dramatic response to therapy, also with decrease in size when compared to the prior study. 2. Signs of prior right mastectomy and axillary dissection as before. 3. Left adrenal activity in no longer above the level of activity seen in the contralateral, right adrenal gland or in the liver. Attention on follow-up. 4. No new signs of disease.   06/27/2019 PET scan   IMPRESSION: 1. Further decrease in size of a dominant hepatic mass which is non FDG avid. 2. No evidence of metastatic disease. 3. No evidence of left adrenal hypermetabolism or mass. 4. Incidental findings, including: Uterine fibroids. Coronary artery atherosclerosis. Aortic Atherosclerosis (ICD10-I70.0). Pulmonary artery enlargement suggests pulmonary arterial hypertension.   06/28/2019 -  Chemotherapy   Maintenance single agent Gemcitabine q2weeks  starting on 06/28/19 with C16.    09/15/2019 PET scan   IMPRESSION: 1. In the region of regional tumor at the junction of the right and left hepatic lobe, there is continued hypodensity and overall activity slightly less than that of the surrounding normal liver, indicating effective response to therapy. No current worrisome hypermetabolic lesion is identified in the liver or elsewhere. 2. Chronic asymmetric edema in the subcutaneous tissues of the right upper extremity, nonspecific. The patient has had a prior right mastectomy and right axillary dissection. 3. Other imaging findings of potential clinical significance: Aortic Atherosclerosis (ICD10-I70.0). Coronary atherosclerosis. Mild cardiomegaly. Small right and trace left  pleural effusions. Subcutaneous and mild mesenteric edema. Suspected uterine fibroids.   12/18/2019 PET scan   IMPRESSION: 1. No significant abnormal activity to suggest active/recurrent malignancy. 2. Lingual tonsillar activity is mildly increased from prior but probably incidental/physiologic, attention on follow up suggested. 3. Other imaging findings of potential clinical significance: Third spacing of fluid with trace ascites, mesenteric edema, subcutaneous edema, and possibly subtle pulmonary edema. Mild cardiomegaly. Aortic Atherosclerosis (ICD10-I70.0). Coronary atherosclerosis. Right ovarian dermoid. Calcified uterine fibroids. Renal cysts. Right glenohumeral arthropathy. Mild to moderate scoliosis. Suspected pulmonary arterial hypertension.     CURRENT THERAPY:  Maintenance single agent Gemcitabine q2weeks starting on 06/28/19 with C16; now on chemo break  INTERVAL HISTORY: Ms. Diegel returns for f/u as scheduled. She was last seen in clinic 01/15/20 and went on chemo break. She was readmitted for CHF exacerbation and hypertensive crisis 11/4/- 11/8. She responded to medical management, fluid status and breathing improved.  She still notes an occasional wheeze but nothing persistent.  No cough or dyspnea.  She has lost weight some of which she attributes to losing fluid.  He does not like having a weight restriction.  She has an occasional lower abdominal pain prior to bowel movement but nothing persistent.  No nausea or vomiting.  No signs of jaundice.  No recent fever or chills.  She continues PT through advanced home care since discharge for approximately 9 weeks.  Her made complaint remains her neuropathy, legs feel very heavy.  This is worse in the cold.  She has energy but does not feel like she can do the things she wants to do due to her neuropathy.  She tried gabapentin, Lyrica, and Cymbalta in the past.   MEDICAL HISTORY:  Past Medical History:  Diagnosis Date  . Anemia    . Anxiety   . Arthritis   . Breast cancer ([place]) 1992  . Cataract    Bilateral eyes - surgery to remove  . Chronic diastolic CHF (congestive heart failure) ([place])   . Chronic kidney disease  CKD stage 3  . Complication of anesthesia    "something they use make me itch for a couple of days."  . Depression   . Diabetes mellitus    type 2  . Duodenitis 01/18/2002  . Fainting spell   . GERD (gastroesophageal reflux disease)   . Heart murmur    never has caused any problems  . Hiatal hernia 08/08/2008, 01/18/2002  . History of pneumonia    x 2  . Hyperlipidemia   . Hypertension   . Liver cancer ([place]) 08/2018  . Lymphedema 2017   Right arm    SURGICAL HISTORY: Past Surgical History:  Procedure Laterality Date  . AXILLARY SURGERY     cyst removal, right  . COLONOSCOPY  09/23/2018   Dr. Havery Moros - polyps  . EYE SURGERY Bilateral    cataracts to remove  . LAPAROSCOPY N/A 11/17/2018   Procedure: LAPAROSCOPY DIAGNOSTIC, INTRAOPERATIVE ULTRASOUND, PERITONEAL BIOPSIES;  Surgeon: [name], MD;  Location: [facility];  Service: General;  Laterality: N/A;  GENERAL AND EPIDURAL  . LIVER BIOPSY  08/2018   Dr. Lindwood Coke  . MASTECTOMY  1992   right, with flap  . NM MYOCAR PERF WALL MOTION  06/11/2009   Protocol:[place], post stress EF58%, EKG negative for ischemia, low risk  . PORTACATH PLACEMENT N/A 12/02/2018   Procedure: INSERTION PORT-A-CATH;  Surgeon: [name], MD;  Location: [facility];  Service: General;  Laterality: N/A;  . RECONSTRUCTION BREAST W/ TRAM FLAP Right   . TONSILLECTOMY    . TRANSTHORACIC ECHOCARDIOGRAM  12/24/2009   LVEF =>55%, normal study  . UPPER GI ENDOSCOPY      I have reviewed the social history and family history with the patient and they are unchanged from previous note.  ALLERGIES:  has No Known Allergies.  MEDICATIONS:  Current Outpatient Medications  Medication Sig Dispense Refill  . amLODipine (NORVASC) 10 MG tablet Take 1 tablet (10 mg total) by  mouth daily. 90 tablet 3  . aspirin 81 MG tablet Take 81 mg by mouth daily.     . carvedilol (COREG) 25 MG tablet Take 1 tablet (25 mg total) by mouth 2 (two) times daily with a meal. 60 tablet 0  . furosemide (LASIX) 40 MG tablet Take 1 tablet (40 mg total) by mouth daily. 30 tablet 0  . glucose blood test strip 1 each by Other route 2 (two) times daily. Use Onetouch verio test strips as instructed to check blood sugar twice daily. 100 each 2  . hydrALAZINE (APRESOLINE) 100 MG tablet Take 1 tablet (100 mg total) by mouth every 8 (eight) hours. 90 tablet 0  . lansoprazole (PREVACID) 15 MG capsule Take 1 capsule (15 mg total) by mouth daily. 90 capsule 1  . loratadine (CLARITIN) 10 MG tablet Take 10 mg by mouth daily as needed for allergies.    . Omega 3 1000 MG CAPS Take 2,000 mg by mouth daily.     . potassium chloride SA (KLOR-CON) 10 MEQ tablet Take 1 tablet (10 mEq total) by mouth daily.    . sitaGLIPtin (JANUVIA) 100 MG tablet Take 1 tablet (100 mg total) by mouth daily. 90 tablet 4  . valsartan (DIOVAN) 320 MG tablet Take 1 tablet (320 mg total) by mouth daily. 90 tablet 3   Current Facility-Administered Medications  Medication Dose Route Frequency Provider Last Rate Last Admin  . triamcinolone acetonide (KENALOG) 10 MG/ML injection 10 mg  10 mg Other Once Harriet Masson, DPM  PHYSICAL EXAMINATION: ECOG PERFORMANCE STATUS: 2 - Symptomatic, <50% confined to bed  Vitals:   02/05/20 0842  BP: (!) 160/61  Pulse: 68  Resp: 18  Temp: 97.9 F (36.6 C)  SpO2: 98%   Filed Weights   02/05/20 0842  Weight: 120 lb 14.4 oz (54.8 kg)    GENERAL:alert, no distress and comfortable SKIN: No rash EYES: sclera clear LUNGS: clear with normal breathing effort, no wheezing or rhonchi HEART: regular rate & rhythm, mild bilateral lower extremity edema ABDOMEN:abdomen soft, non-tender and normal bowel sounds NEURO: alert & oriented x 3 with fluent speech PAC without  erythema  LABORATORY DATA:  I have reviewed the data as listed CBC Latest Ref Rng & Units 02/05/2020 01/18/2020 01/18/2020  WBC 4.0 - 10.5 K/uL 6.1 - 9.0  Hemoglobin 12.0 - 15.0 g/dL 8.0(G) 19.0 83.4  Hematocrit 36 - 46 % 30.6(L) 37.0 39.1  Platelets 150 - 400 K/uL 222 - 450(H)     CMP Latest Ref Rng & Units 02/05/2020 01/29/2020 01/22/2020  Glucose 70 - 99 mg/dL 84 85 76  BUN 8 - 23 mg/dL 49(X) 28(I) 64(U)  Creatinine 0.44 - 1.00 mg/dL 6.96(N) 3.48(I) 7.79(O)  Sodium 135 - 145 mmol/L 140 141 140  Potassium 3.5 - 5.1 mmol/L 4.8 5.2 4.1  Chloride 98 - 111 mmol/L 110 106 106  CO2 22 - 32 mmol/L 19(L) 22 26  Calcium 8.9 - 10.3 mg/dL 5.9(U) 8.7 1.0(T)  Total Protein 6.5 - 8.1 g/dL 7.4 - -  Total Bilirubin 0.3 - 1.2 mg/dL 0.3 - -  Alkaline Phos 38 - 126 U/L 71 - -  AST 15 - 41 U/L 25 - -  ALT 0 - 44 U/L 7 - -      RADIOGRAPHIC STUDIES: I have personally reviewed the radiological images as listed and agreed with the findings in the report. No results found.   ASSESSMENT & PLAN: MATEJA DIER [id number]   1.Intrahepatic cholangiocarcinoma, cT1N0M1,with peritoneal metastasis, MSS, IDH1 mutation (+) -Diagnosed in 09/2018 biopsy of her liver mass shows adenocarcinoma,most consistent withcholangiocarcinoma -09/29/18 PET scanshowedno evidence of distant metastasis. -Unfortunately she was found to have peritoneal metastasis to right diaphragm and surgery was aborted. -HerCT AP from 9/15/20shows dominate liver mass stable to mild increase, mild nodularity of both adrenal glands which warrants being monitored.No visible peritoneal metastasis on CT scan. -Given her metastatic cancer and ineligibility forsurgery,she began first-line systemic chemotherapy for disease control. -FO resultsshowed MSI stable disease, IDH mutation positive.Consider IDH 1 inhibitor in the future.Not a candidate for immunotherapy -She beganfirst lineoxaliplatin(due to CKD)and  gemcitabine every 2 weeks on 11/30/2018 -she responded very well to treatment and tolerated without significant toxicities except progressive neuropathy. Oxali was dose reduced and eventually stopped after cycle 11. She received single agent gemcitabine with cycle 12.Cisplatin added with C13 on 05/17/19 but neuropathy worsened and stopped on 06/14/19.  -She has been receiving single agent gemcitabine every 2 weeks starting from 06/28/19 C16,tolerating well.PET on 09/15/2019 showed stable disease.  -Her PET from 12/18/2019 showed no hypermetabolic activity to suggest active/recurrent malignancy.  Last dose chemo 12/27/2019 and hospitalized twice recently for recurrent CHF -Dr. Mosetta Putt has recommended 2-59-month chemo break given her excellent response on PET scan and low tolerance to chemo  2.  Recurrent CHF, hospitalizations -Most recent 11/4-11/8, recovered -On carvedilol, hydralazine, valsartan, amlodipine, furosemide -Continue follow-up with cardiology, next 11/24  3.  Grade 3 CIPN -secondary to Oxaliplatin, eventually dc'd after cycle 11 -started cisplatin on 05/17/19, neuropathy worsened in  her feet with unsteady gait and functional limitations, decreased vibratory sense on tuning fork exam 05/31/19.Cisplatin was dose reducedthen eventually stopped on 06/14/19. -low efficacy of gabapentin and cymbalta, also tried lyrica briefly.She completed PT for balance. Ambulates with cane to avoid fall -She has functional difficulties and ambulates with a cane -Follow-up open with Dr. Mickeal Skinner -She was previously referred to neurologist Dr. Marcial Pacas back in 09/2019, she has not gone to the appointment yet but is interested.  She will call let us know she wants [place] to arrange a consult  4. H/o right breast cancer, Geneticsnegative -s/p mastectomy in 1992, per patient did not require adjuvant therapy  -previouslyfollowed by Dr. Jana Hakim -VUS of gene APC;genetics otherwise normal  5.Comorbidities:DM,  HTN, hiatal hernia, GERD, renal artery stenosis -Follow-up with PCP, nephrology, and Dr. Dwyane Dee -Crfluctuates, overall much improved fromJune- Sept this year (1.78 at highest). -On fluid restriction for CHF, avoid nephrotoxic agents.  6. Family support  -she has a son and daughterwho are supportive. -patient takes pride in remaining independent, but has become more reluctant to drive due to CIPN in her feet  7.Goal of care discussion  -Wefrequently discussthe goal of her treatment is palliative and that hercancer is not curable with chemo alone at this stage. The goal is to control her disease, prolong her life, and give her good quality of life. She understands -she is full code now   Disposition: Ms. Diamond appears stable.  She has recovered from recent hospitalization for CHF exacerbation.  She continues home PT.  She will follow-up with cardiology on 11/24.  She has no signs and symptoms of disease progression.  Her main complaint is neuropathy.  She has tried gabapentin, Lyrica, and Cymbalta without any improvement.  She did not benefit from referral to Dr. Mickeal Skinner.  She has been referred to a second neurologist but did not proceed with the appointment yet.  She will call let us know if she wants to be referred back to establish care.  She remains on a chemo break.  She will return for lab and follow-up in 3-4 weeks, will order PET scan at that visit.  She knows to call sooner if she develops abdominal pain, signs of jaundice, progressive fatigue, unintentional weight loss, or other changes from her baseline.  All questions were answered. The patient knows to call the clinic with any problems, questions or concerns. No barriers to learning were detected.     Alla Feeling, NP 02/05/20

## 2020-02-05 ENCOUNTER — Other Ambulatory Visit: Payer: Self-pay

## 2020-02-05 ENCOUNTER — Inpatient Hospital Stay (HOSPITAL_BASED_OUTPATIENT_CLINIC_OR_DEPARTMENT_OTHER): Payer: Medicare Other | Admitting: Nurse Practitioner

## 2020-02-05 ENCOUNTER — Inpatient Hospital Stay: Payer: Medicare Other

## 2020-02-05 ENCOUNTER — Encounter: Payer: Self-pay | Admitting: Nurse Practitioner

## 2020-02-05 VITALS — BP 160/61 | HR 68 | Temp 97.9°F | Resp 18 | Ht 65.0 in | Wt 120.9 lb

## 2020-02-05 DIAGNOSIS — C221 Intrahepatic bile duct carcinoma: Secondary | ICD-10-CM

## 2020-02-05 DIAGNOSIS — T451X5A Adverse effect of antineoplastic and immunosuppressive drugs, initial encounter: Secondary | ICD-10-CM | POA: Diagnosis not present

## 2020-02-05 DIAGNOSIS — M8580 Other specified disorders of bone density and structure, unspecified site: Secondary | ICD-10-CM | POA: Diagnosis not present

## 2020-02-05 DIAGNOSIS — D649 Anemia, unspecified: Secondary | ICD-10-CM

## 2020-02-05 DIAGNOSIS — G620 Drug-induced polyneuropathy: Secondary | ICD-10-CM | POA: Diagnosis not present

## 2020-02-05 DIAGNOSIS — C9000 Multiple myeloma not having achieved remission: Secondary | ICD-10-CM

## 2020-02-05 DIAGNOSIS — Z95828 Presence of other vascular implants and grafts: Secondary | ICD-10-CM

## 2020-02-05 DIAGNOSIS — D6481 Anemia due to antineoplastic chemotherapy: Secondary | ICD-10-CM | POA: Diagnosis not present

## 2020-02-05 DIAGNOSIS — I5032 Chronic diastolic (congestive) heart failure: Secondary | ICD-10-CM | POA: Diagnosis not present

## 2020-02-05 DIAGNOSIS — N183 Chronic kidney disease, stage 3 unspecified: Secondary | ICD-10-CM | POA: Diagnosis not present

## 2020-02-05 DIAGNOSIS — I130 Hypertensive heart and chronic kidney disease with heart failure and stage 1 through stage 4 chronic kidney disease, or unspecified chronic kidney disease: Secondary | ICD-10-CM | POA: Diagnosis not present

## 2020-02-05 LAB — MAGNESIUM: Magnesium: 2.6 mg/dL — ABNORMAL HIGH (ref 1.7–2.4)

## 2020-02-05 LAB — CMP (CANCER CENTER ONLY)
ALT: 7 U/L (ref 0–44)
AST: 25 U/L (ref 15–41)
Albumin: 3.1 g/dL — ABNORMAL LOW (ref 3.5–5.0)
Alkaline Phosphatase: 71 U/L (ref 38–126)
Anion gap: 11 (ref 5–15)
BUN: 42 mg/dL — ABNORMAL HIGH (ref 8–23)
CO2: 19 mmol/L — ABNORMAL LOW (ref 22–32)
Calcium: 8.4 mg/dL — ABNORMAL LOW (ref 8.9–10.3)
Chloride: 110 mmol/L (ref 98–111)
Creatinine: 1.7 mg/dL — ABNORMAL HIGH (ref 0.44–1.00)
GFR, Estimated: 30 mL/min — ABNORMAL LOW (ref >60)
Glucose, Bld: 84 mg/dL (ref 70–99)
Potassium: 4.8 mmol/L (ref 3.5–5.1)
Sodium: 140 mmol/L (ref 135–145)
Total Bilirubin: 0.3 mg/dL (ref 0.3–1.2)
Total Protein: 7.4 g/dL (ref 6.5–8.1)

## 2020-02-05 LAB — CBC WITH DIFFERENTIAL (CANCER CENTER ONLY)
Abs Immature Granulocytes: 0.01 10*3/uL (ref 0.00–0.07)
Basophils Absolute: 0 10*3/uL (ref 0.0–0.1)
Basophils Relative: 1 %
Eosinophils Absolute: 0.2 10*3/uL (ref 0.0–0.5)
Eosinophils Relative: 3 %
HCT: 30.6 % — ABNORMAL LOW (ref 36.0–46.0)
Hemoglobin: 9.6 g/dL — ABNORMAL LOW (ref 12.0–15.0)
Immature Granulocytes: 0 %
Lymphocytes Relative: 21 %
Lymphs Abs: 1.3 10*3/uL (ref 0.7–4.0)
MCH: 31.9 pg (ref 26.0–34.0)
MCHC: 31.4 g/dL (ref 30.0–36.0)
MCV: 101.7 fL — ABNORMAL HIGH (ref 80.0–100.0)
Monocytes Absolute: 0.6 10*3/uL (ref 0.1–1.0)
Monocytes Relative: 9 %
Neutro Abs: 4.1 10*3/uL (ref 1.7–7.7)
Neutrophils Relative %: 66 %
Platelet Count: 222 10*3/uL (ref 150–400)
RBC: 3.01 MIL/uL — ABNORMAL LOW (ref 3.87–5.11)
RDW: 15.8 % — ABNORMAL HIGH (ref 11.5–15.5)
WBC Count: 6.1 10*3/uL (ref 4.0–10.5)
nRBC: 0 % (ref 0.0–0.2)

## 2020-02-05 LAB — SAMPLE TO BLOOD BANK

## 2020-02-05 MED ORDER — HEPARIN SOD (PORK) LOCK FLUSH 100 UNIT/ML IV SOLN
500.0000 [IU] | Freq: Once | INTRAVENOUS | Status: AC
  Administered 2020-02-05: 500 [IU]
  Filled 2020-02-05: qty 5

## 2020-02-05 MED ORDER — SODIUM CHLORIDE 0.9% FLUSH
10.0000 mL | Freq: Once | INTRAVENOUS | Status: AC
  Administered 2020-02-05: 10 mL
  Filled 2020-02-05: qty 10

## 2020-02-07 ENCOUNTER — Encounter: Payer: Self-pay | Admitting: General Practice

## 2020-02-07 ENCOUNTER — Other Ambulatory Visit: Payer: Self-pay

## 2020-02-07 ENCOUNTER — Ambulatory Visit (INDEPENDENT_AMBULATORY_CARE_PROVIDER_SITE_OTHER): Payer: Medicare Other | Admitting: General Practice

## 2020-02-07 VITALS — BP 138/52 | HR 76 | Ht 65.0 in | Wt 123.4 lb

## 2020-02-07 DIAGNOSIS — I2489 Other forms of acute ischemic heart disease: Secondary | ICD-10-CM

## 2020-02-07 DIAGNOSIS — C221 Intrahepatic bile duct carcinoma: Secondary | ICD-10-CM | POA: Diagnosis not present

## 2020-02-07 DIAGNOSIS — E7800 Pure hypercholesterolemia, unspecified: Secondary | ICD-10-CM

## 2020-02-07 DIAGNOSIS — I10 Essential (primary) hypertension: Secondary | ICD-10-CM | POA: Diagnosis not present

## 2020-02-07 DIAGNOSIS — I248 Other forms of acute ischemic heart disease: Secondary | ICD-10-CM | POA: Diagnosis not present

## 2020-02-07 DIAGNOSIS — I5033 Acute on chronic diastolic (congestive) heart failure: Secondary | ICD-10-CM | POA: Diagnosis not present

## 2020-02-07 DIAGNOSIS — C787 Secondary malignant neoplasm of liver and intrahepatic bile duct: Secondary | ICD-10-CM | POA: Diagnosis not present

## 2020-02-07 MED ORDER — VALSARTAN 320 MG PO TABS: 320.0000 mg | ORAL_TABLET | Freq: Every day | ORAL | 3 refills | Status: DC

## 2020-02-07 NOTE — Progress Notes (Signed)
Thanks, Denyse Amass. Agree

## 2020-02-07 NOTE — Patient Instructions (Addendum)
Medication Instructions:  The current medical regimen is effective;  continue present plan and medications as directed. Please refer to the Current Medication list given to you today. *If you need a refill on your cardiac medications before your next appointment, please call your pharmacy*  Lab Work: FASTING LIPID AS SOON AS YOU CAN If you have labs (blood work) drawn today and your tests are completely normal, you will receive your results only by:  [place] (if you have MyChart) OR A paper copy in the mail.  If you have any lab test that is abnormal or we need to change your treatment, we will call you to review the results. You may go to any [facility] that is convenient for you however, we do have a lab in our office that is able to assist you. You DO NOT need an appointment for our lab. The lab is open 8:00am and closes at 4:00pm. Lunch 12:45 - 1:45pm.  Special Instructions TAKE AND LOG YOUR WEIGHT AND BLOOD PRESSURE DAILY-AT DIFFERENT TIMES  \PLEASE CALL IF YOUR WEIGHT IS >3 lbs IN A DAY OR >5# IN A WEEK  PLEASE READ AND FOLLOW SALTY 6-ATTACHED-1,800 mg daily  PLEASE INCREASE PHYSICAL ACTIVITY AS TOLERATED  Follow-Up: Your next appointment:  3 month(s) In Person with Sanda Klein, MD OR IF UNAVAILABLE JESSE CLEAVER, FNP-C  At [facility], you and your health needs are our priority.  As part of our continuing mission to provide you with exceptional heart care, we have created designated Provider Care Teams.  These Care Teams include your primary Cardiologist (physician) and Advanced Practice Providers (APPs -  Physician Assistants and Nurse Practitioners) who all work together to provide you with the care you need, when you need it.

## 2020-02-09 DIAGNOSIS — E1122 Type 2 diabetes mellitus with diabetic chronic kidney disease: Secondary | ICD-10-CM | POA: Diagnosis not present

## 2020-02-09 DIAGNOSIS — I161 Hypertensive emergency: Secondary | ICD-10-CM | POA: Diagnosis not present

## 2020-02-09 DIAGNOSIS — I110 Hypertensive heart disease with heart failure: Secondary | ICD-10-CM | POA: Diagnosis not present

## 2020-02-09 DIAGNOSIS — J9601 Acute respiratory failure with hypoxia: Secondary | ICD-10-CM | POA: Diagnosis not present

## 2020-02-09 DIAGNOSIS — N183 Chronic kidney disease, stage 3 unspecified: Secondary | ICD-10-CM | POA: Diagnosis not present

## 2020-02-09 DIAGNOSIS — I5033 Acute on chronic diastolic (congestive) heart failure: Secondary | ICD-10-CM | POA: Diagnosis not present

## 2020-02-13 ENCOUNTER — Other Ambulatory Visit: Payer: Self-pay | Admitting: *Deleted

## 2020-02-14 ENCOUNTER — Telehealth: Payer: Self-pay

## 2020-02-14 DIAGNOSIS — I248 Other forms of acute ischemic heart disease: Secondary | ICD-10-CM | POA: Diagnosis not present

## 2020-02-14 DIAGNOSIS — I10 Essential (primary) hypertension: Secondary | ICD-10-CM | POA: Diagnosis not present

## 2020-02-14 DIAGNOSIS — C221 Intrahepatic bile duct carcinoma: Secondary | ICD-10-CM | POA: Diagnosis not present

## 2020-02-14 DIAGNOSIS — C787 Secondary malignant neoplasm of liver and intrahepatic bile duct: Secondary | ICD-10-CM | POA: Diagnosis not present

## 2020-02-14 DIAGNOSIS — I5033 Acute on chronic diastolic (congestive) heart failure: Secondary | ICD-10-CM | POA: Diagnosis not present

## 2020-02-14 DIAGNOSIS — E7800 Pure hypercholesterolemia, unspecified: Secondary | ICD-10-CM | POA: Diagnosis not present

## 2020-02-14 NOTE — Telephone Encounter (Signed)
140 pm.  Phone call made to patient's home to schedule a visit for next week.  No answer and message left requesting call back.  Also attempted to reach patient on her cell but no answer.  Message has also been left on cell VM.  PLAN:  Awaiting call back.  If no call back from patient, I will reach out again.

## 2020-02-15 ENCOUNTER — Other Ambulatory Visit: Payer: Self-pay

## 2020-02-15 DIAGNOSIS — N183 Chronic kidney disease, stage 3 unspecified: Secondary | ICD-10-CM | POA: Diagnosis not present

## 2020-02-15 DIAGNOSIS — I5033 Acute on chronic diastolic (congestive) heart failure: Secondary | ICD-10-CM | POA: Diagnosis not present

## 2020-02-15 DIAGNOSIS — J9601 Acute respiratory failure with hypoxia: Secondary | ICD-10-CM | POA: Diagnosis not present

## 2020-02-15 DIAGNOSIS — I161 Hypertensive emergency: Secondary | ICD-10-CM | POA: Diagnosis not present

## 2020-02-15 DIAGNOSIS — E1122 Type 2 diabetes mellitus with diabetic chronic kidney disease: Secondary | ICD-10-CM | POA: Diagnosis not present

## 2020-02-15 DIAGNOSIS — I110 Hypertensive heart disease with heart failure: Secondary | ICD-10-CM | POA: Diagnosis not present

## 2020-02-15 LAB — LIPID PANEL
Chol/HDL Ratio: 2.8 ratio (ref 0.0–4.4)
Cholesterol, Total: 239 mg/dL — ABNORMAL HIGH (ref 100–199)
HDL: 85 mg/dL (ref >39)
LDL Chol Calc (NIH): 145 mg/dL — ABNORMAL HIGH (ref 0–99)
Triglycerides: 55 mg/dL (ref 0–149)
VLDL Cholesterol Cal: 9 mg/dL (ref 5–40)

## 2020-02-15 MED ORDER — HYDRALAZINE HCL 100 MG PO TABS: 100.0000 mg | ORAL_TABLET | Freq: Three times a day (TID) | ORAL | 3 refills | Status: DC

## 2020-02-15 MED ORDER — CARVEDILOL 25 MG PO TABS
25.0000 mg | ORAL_TABLET | Freq: Two times a day (BID) | ORAL | 3 refills | Status: DC
Start: 2020-02-15 — End: 2021-03-31

## 2020-02-16 ENCOUNTER — Other Ambulatory Visit: Payer: Self-pay

## 2020-02-16 DIAGNOSIS — E1122 Type 2 diabetes mellitus with diabetic chronic kidney disease: Secondary | ICD-10-CM | POA: Diagnosis not present

## 2020-02-16 DIAGNOSIS — I5033 Acute on chronic diastolic (congestive) heart failure: Secondary | ICD-10-CM | POA: Diagnosis not present

## 2020-02-16 DIAGNOSIS — N183 Chronic kidney disease, stage 3 unspecified: Secondary | ICD-10-CM | POA: Diagnosis not present

## 2020-02-16 DIAGNOSIS — I110 Hypertensive heart disease with heart failure: Secondary | ICD-10-CM | POA: Diagnosis not present

## 2020-02-16 DIAGNOSIS — I161 Hypertensive emergency: Secondary | ICD-10-CM | POA: Diagnosis not present

## 2020-02-16 DIAGNOSIS — J9601 Acute respiratory failure with hypoxia: Secondary | ICD-10-CM | POA: Diagnosis not present

## 2020-02-16 MED ORDER — ROSUVASTATIN CALCIUM 20 MG PO TABS: 20.0000 mg | ORAL_TABLET | Freq: Every day | ORAL | 3 refills | Status: DC

## 2020-02-17 ENCOUNTER — Other Ambulatory Visit: Payer: Self-pay | Admitting: Cardiovascular Disease

## 2020-02-18 ENCOUNTER — Other Ambulatory Visit: Payer: Self-pay | Admitting: Cardiovascular Disease

## 2020-02-20 DIAGNOSIS — N183 Chronic kidney disease, stage 3 unspecified: Secondary | ICD-10-CM | POA: Diagnosis not present

## 2020-02-20 DIAGNOSIS — I161 Hypertensive emergency: Secondary | ICD-10-CM | POA: Diagnosis not present

## 2020-02-20 DIAGNOSIS — E1122 Type 2 diabetes mellitus with diabetic chronic kidney disease: Secondary | ICD-10-CM | POA: Diagnosis not present

## 2020-02-20 DIAGNOSIS — J9601 Acute respiratory failure with hypoxia: Secondary | ICD-10-CM | POA: Diagnosis not present

## 2020-02-20 DIAGNOSIS — I5033 Acute on chronic diastolic (congestive) heart failure: Secondary | ICD-10-CM | POA: Diagnosis not present

## 2020-02-20 DIAGNOSIS — I110 Hypertensive heart disease with heart failure: Secondary | ICD-10-CM | POA: Diagnosis not present

## 2020-02-22 ENCOUNTER — Other Ambulatory Visit: Payer: Self-pay | Admitting: *Deleted

## 2020-02-22 MED ORDER — FUROSEMIDE 40 MG PO TABS
40.0000 mg | ORAL_TABLET | Freq: Every day | ORAL | 11 refills | Status: DC
Start: 2020-02-22 — End: 2021-03-28

## 2020-02-22 NOTE — Patient Outreach (Signed)
[facility]) Care Management  02/22/2020  ARETHA LEVI 1941/03/01 [id number]  Telephone outreach for follow up multiple co-morbidities:  HF: Wt 117-120#, No edema or SOB. Worries about how thin she is.  Intrahepatic cholangiocarcinoma - stable, no treatment currently, still followed by oncology  Chemo induced neuropathy - this is a problem she complains about frequently and it does effect her quality of life to a great extent. Not only does not have the pins and needles sensation but it also affects her dexterity to a great extent. She does take gabapentin but does not like how drowsy it makes her feel.  DM - diet controlled  CKD - CMP done 11/22 - Creatine 1.70, GFR 30.  Found discrepancy on medication reconciliation: Pt taking furosemide 40 mg daily and entry on 12/7 indicates the order is for furosemide 40 mg two times a week. Messaged Dr. Sallyanne Kuster for validation of correct dose.   Dr. Sallyanne Kuster confirmed pt should be taking furosemide 40 mg daily.  Goals Addressed            This Visit's Progress   . Patient Stated   On track    Will participate in phone calls with [facility] care manager. 01/10/20 Reinforced the importance of our calls to report in her progress or challenges.    . Track and Manage Symptoms   On track    Follow Up Date 03/30/20  - begin a heart failure diary - bring diary to all appointments - develop a rescue plan - eat more whole grains, fruits and vegetables, lean meats and healthy fats - follow rescue plan if symptoms flare-up - know when to call the doctor - track symptoms and what helps feel better or worse    Why is this important?   You will be able to handle your symptoms better if you keep track of them.  Making some simple changes to your lifestyle will help.  Eating healthy is one thing you can do to take good care of yourself.    Notes: Has Disease management calendar and has started recording her wt. She is familiarizing  herself with the HF action plan. 01/24/20 Had another hospitalization for HF, today first discharge conversation. Reviewed HF management principles, diet and need for primary care appt along with scheduled cardiology and oncology appts. Need to work on recognizing sxs and seeking help before needing to go to the hospital. 02/22/20 Pt seems to be at dry wt 117-120#, No SOB nor edema. Creat 1.7, GFR 30. Stockings have been ordered and should arrive this week. Reinforced pt to call NP or cardiologist with increasing sxs!      We agreed to talk again in mid January.  [name]. [name], MSN, [facility] Gerontological Nurse Practitioner [facility] Care Management [phone]

## 2020-02-22 NOTE — Progress Notes (Signed)
Patient is currently on Furosemide 40 mg once daily. Correct dosage has been sent in.

## 2020-02-28 ENCOUNTER — Ambulatory Visit: Payer: Medicare Other | Admitting: Nurse Practitioner

## 2020-02-28 NOTE — Progress Notes (Signed)
[place]   Telephone:([phone] Fax:([phone]   Clinic Follow up Note   Patient Care Team: [name], MD as PCP - General (Family Medicine) [name], [name], MD as PCP - Cardiology (Cardiology) [name], [name], MD as Consulting Physician (Cardiology) [name], MD as Consulting Physician (Obstetrics and Gynecology) [name], [name], NP as Nurse Practitioner (Hematology and Oncology) [facility] [name], MD as Consulting Physician (Hematology) [name], [name], MD as Consulting Physician (Gastroenterology) Virgina Evener, Dawn, RN (Inactive) as Oncology Nurse Navigator [name], MD as Consulting Physician (General Surgery) [name], NP as [place] Management 02/29/2020  CHIEF COMPLAINT: Follow-up cholangiocarcinoma  SUMMARY OF ONCOLOGIC HISTORY: Oncology History Overview Note  Cancer Staging Intrahepatic cholangiocarcinoma ([place]) Staging form: Intrahepatic Bile Duct, AJCC 8th Edition - Clinical stage from 09/23/2018: Stage IB (cT1b, cN0, cM0) - Signed by [name], MD on 10/06/2018    Malignant neoplasm of female breast ([place])  11/06/2018 Genetic Testing   Negative genetic testing on the [facility] Common Hereditary Cancers panel. A variant of uncertain significance was identified in one of her APC genes, called c.1243G>A (p.Ala415Thr).  The Common Hereditary Cancers Panel offered by [employer] includes sequencing and/or deletion duplication testing of the following 48 genes: APC, ATM, AXIN2, BARD1, BMPR1A, BRCA1, BRCA2, BRIP1, CDH1, CDK4, CDKN2A (p14ARF), CDKN2A (p16INK4a), CHEK2, CTNNA1, DICER1, EPCAM (Deletion/duplication testing only), GREM1 (promoter region deletion/duplication testing only), KIT, MEN1, MLH1, MSH2, MSH3, MSH6, MUTYH, NBN, NF1, NHTL1, PALB2, PDGFRA, PMS2, POLD1, POLE, PTEN, RAD50, RAD51C, RAD51D, RNF43, SDHB, SDHC, SDHD, SMAD4, SMARCA4. STK11, TP53, TSC1, TSC2, and VHL.  The  following genes were evaluated for sequence changes only: SDHA and HOXB13 c.251G>A variant only.    Intrahepatic cholangiocarcinoma ([place])  09/07/2018 Imaging   CT Chest IMPRESSION: 1. New, enhancing mass involving segment 4 of the liver and fundus of gallbladder is concerning for malignancy. This may represent either metastatic disease from breast cancer or neoplasm primary to the liver or hepatic biliary tree. Further evaluation with contrast enhanced CT of the abdomen and pelvis is recommended. 2. No findings to suggest metastatic disease within the chest. 3.  Aortic Atherosclerosis (ICD10-I70.0). 4. Coronary artery calcifications.   09/13/2018 Pathology Results   Diagnosis Liver, needle/core biopsy - ADENOCARCINOMA. Microscopic Comment Immunohistochemistry for CK7 is positive. CK20, TTF1, CDX-2, GATA-3, PAX 8, Qualitative ER, p63 and CK5/6 are negative. The provided clinical history of remote mammary carcinoma is noted. Based on the morphology and immunophenotype of the adenocarcinoma observed in this specimen, primary cholangiocarcinoma is favored. Clinical and radiologic correlation are  encouraged. Results reported to [employer] on 09/15/2018. Intradepartmental consultation (Dr. Vic Ripper).   09/13/2018 Initial Diagnosis   Cholangiocarcinoma ([place])   09/23/2018 Procedure   Colonoscopy by Dr. Havery Moros 09/23/18  IMPRESSION - Two 3 to 4 mm polyps in the ascending colon, removed with a cold snare. Resected and retrieved. - Five 3 to 5 mm polyps in the transverse colon, removed with a cold snare. Resected and retrieved. - One 5 mm polyp at the splenic flexure, removed with a cold snare. Resected and retrieved. - Three 3 to 5 mm polyps in the sigmoid colon, removed with a cold snare. Resected and retrieved. - The examination was otherwise normal. Upper Endopscy by Dr. Havery Moros 09/23/18  IMPRESSION - Esophagogastric landmarks identified. - 2 cm hiatal hernia. - Normal esophagus  otherwise. - A single gastric polyp. Resected and retrieved. - Mild gastritis. Biopsied. - Normal duodenal bulb and second portion of the duodenum.  09/23/2018 Pathology Results   Diagnosis 09/23/18 1. Surgical [P], duodenum - BENIGN SMALL BOWEL MUCOSA. - NO ACTIVE INFLAMMATION OR VILLOUS ATROPHY IDENTIFIED. 2. Surgical [P], stomach, polyp - HYPERPLASTIC POLYP(S). - THERE IS NO EVIDENCE OF MALIGNANCY. 3. Surgical [P], gastric antrum and gastric body - CHRONIC INACTIVE GASTRITIS. - THERE IS NO EVIDENCE OF HELICOBACTER-PYLORI, DYSPLASIA, OR MALIGNANCY. - SEE COMMENT. 4. Surgical [P], colon, sigmoid, splenic flexure, transverse and ascending, polyp (9) - TUBULAR ADENOMA(S). - SESSILE SERRATED POLYP WITHOUT CYTOLOGIC DYSPLASIA. - HIGH GRADE DYSPLASIA IS NOT IDENTIFIED. 5. Surgical [P], colon, sigmoid, polyp (2) - HYPERPLASTIC POLYP(S). - THERE IS NO EVIDENCE OF MALIGNANCY.   09/23/2018 Cancer Staging   Staging form: Intrahepatic Bile Duct, AJCC 8th Edition - Clinical stage from 09/23/2018: Stage IB (cT1b, cN0, cM0) - Signed by [name], MD on 10/06/2018   09/29/2018 PET scan   PET 09/29/18 IMPRESSION: 1. Hypermetabolic mass in the RIGHT hepatic lobe consistent with biopsy proven adenocarcinoma. No additional liver metastasis. 2. No evidence of local breast cancer recurrence in the RIGHT breast or RIGHT axilla. 3. Mild bilateral hypermetabolic adrenal glands is favored benign hyperplasia. 4. No evidence of additional metastatic disease on skull base to thigh FDG PET scan.   11/06/2018 Genetic Testing   Negative genetic testing on the [employer] Common Hereditary Cancers panel. A variant of uncertain significance was identified in one of her APC genes, called c.1243G>A (p.Ala415Thr).  The Common Hereditary Cancers Panel offered by [facility] includes sequencing and/or deletion duplication testing of the following 48 genes: APC, ATM, AXIN2, BARD1, BMPR1A, BRCA1, BRCA2, BRIP1, CDH1, CDK4,  CDKN2A (p14ARF), CDKN2A (p16INK4a), CHEK2, CTNNA1, DICER1, EPCAM (Deletion/duplication testing only), GREM1 (promoter region deletion/duplication testing only), KIT, MEN1, MLH1, MSH2, MSH3, MSH6, MUTYH, NBN, NF1, NHTL1, PALB2, PDGFRA, PMS2, POLD1, POLE, PTEN, RAD50, RAD51C, RAD51D, RNF43, SDHB, SDHC, SDHD, SMAD4, SMARCA4. STK11, TP53, TSC1, TSC2, and VHL.  The following genes were evaluated for sequence changes only: SDHA and HOXB13 c.251G>A variant only.    11/17/2018 Pathology Results   Diagnosis 11/17/18 1. Soft tissue, biopsy, Diaphragmatic nodules - METASTATIC ADENOCARCINOMA, CONSISTENT WITH PATIENT'S CLINICAL HISTORY OF CHOLANGIOCARCINOMA. SEE NOTE 2. Liver, biopsy, Left - LIVER PARENCHYMA WITH A BENIGN FIBROTIC NODULE - NO EVIDENCE OF MALIGNANCY 3. Stomach, biopsy - BENIGN PAPILLARY MESOTHELIAL HYPERPLASIA - NO EVIDENCE OF MALIGNANCY   11/29/2018 Imaging   CT CAP WO Contrast  IMPRESSION: 1. Dominant liver mass appears grossly stable from 09/29/2018. Additional liver lesions are too small to characterize but were not shown to be hypermetabolic on PET. 2. Mild nodularity of both adrenal glands with associated hypermetabolism on 09/29/2018. Continued attention on follow-up exams is warranted. 3. Small right lower lobe nodules, stable from 09/07/2018. Again, attention on follow-up is recommended. 4. Trace bilateral pleural fluid. 5. Aortic atherosclerosis (ICD10-170.0). Coronary artery calcification. 6. Enlarged pulmonic trunk, indicative of pulmonary arterial hypertension.     11/30/2018 - 06/14/2019 Chemotherapy   First line chemo Oxaliplatin and gemcitabine q2weeks starting 11/30/18. Stopped Oxaliplatin on 05/03/19 due to worsening neuropathy. Added Cisplatin with C13 on 05/17/19 and stopped on C15 06/14/19 due to neuropathy. Reduced to maintenance single agent Gemcitabine on 06/28/19.    01/20/2019 Imaging   CT CAP IMPRESSION: restaging  1. Mild interval increase in size of dominant  liver mass involving segment 4 and segment 5. 2. No significant or progressive adrenal nodularity identified to suggest metastatic disease. 3. Unchanged appearance of small right lower lobe and lingular lung nodules, nonspecific.   03/21/2019 PET scan   IMPRESSION:  1. Large right hepatic mass with SUV uptake near background hepatic activity, dramatic response to therapy, also with decrease in size when compared to the prior study. 2. Signs of prior right mastectomy and axillary dissection as before. 3. Left adrenal activity in no longer above the level of activity seen in the contralateral, right adrenal gland or in the liver. Attention on follow-up. 4. No new signs of disease.   06/27/2019 PET scan   IMPRESSION: 1. Further decrease in size of a dominant hepatic mass which is non FDG avid. 2. No evidence of metastatic disease. 3. No evidence of left adrenal hypermetabolism or mass. 4. Incidental findings, including: Uterine fibroids. Coronary artery atherosclerosis. Aortic Atherosclerosis (ICD10-I70.0). Pulmonary artery enlargement suggests pulmonary arterial hypertension.   06/28/2019 -  Chemotherapy   Maintenance single agent Gemcitabine q2weeks  starting on 06/28/19 with C16.    09/15/2019 PET scan   IMPRESSION: 1. In the region of regional tumor at the junction of the right and left hepatic lobe, there is continued hypodensity and overall activity slightly less than that of the surrounding normal liver, indicating effective response to therapy. No current worrisome hypermetabolic lesion is identified in the liver or elsewhere. 2. Chronic asymmetric edema in the subcutaneous tissues of the right upper extremity, nonspecific. The patient has had a prior right mastectomy and right axillary dissection. 3. Other imaging findings of potential clinical significance: Aortic Atherosclerosis (ICD10-I70.0). Coronary atherosclerosis. Mild cardiomegaly. Small right and trace left pleural  effusions. Subcutaneous and mild mesenteric edema. Suspected uterine fibroids.   12/18/2019 PET scan   IMPRESSION: 1. No significant abnormal activity to suggest active/recurrent malignancy. 2. Lingual tonsillar activity is mildly increased from prior but probably incidental/physiologic, attention on follow up suggested. 3. Other imaging findings of potential clinical significance: Third spacing of fluid with trace ascites, mesenteric edema, subcutaneous edema, and possibly subtle pulmonary edema. Mild cardiomegaly. Aortic Atherosclerosis (ICD10-I70.0). Coronary atherosclerosis. Right ovarian dermoid. Calcified uterine fibroids. Renal cysts. Right glenohumeral arthropathy. Mild to moderate scoliosis. Suspected pulmonary arterial hypertension.     CURRENT THERAPY: Maintenance single agent Gemcitabine q2weeks starting on 06/28/19 with C16; now on chemo break  INTERVAL HISTORY: Ms. Angert returns for follow-up as scheduled.  She was last seen in clinic on 02/05/2020 and went on a treatment break, last chemo on 12/27/2019.  Her neuropathy is worse with the cold weather, she is not doing as well as she would like with her function.  She is mostly resting throughout the day due to her neuropathy.  She has intermittent positional backaches.  She is ready to see a neurologist.  Appetite is stable, no significant weight loss.  Periodic mild abdominal discomfort usually relieved after bowel movement.  No nausea or vomiting.  No signs of jaundice.  Denies fever, chills, cough, chest pain, dyspnea, leg edema.  She is on Lasix.  Scared to drink too much due to fluid overload.  Denies new concerns.   MEDICAL HISTORY:  Past Medical History:  Diagnosis Date  . Anemia   . Anxiety   . Arthritis   . Breast cancer ([place]) 1992  . Cataract    Bilateral eyes - surgery to remove  . Chronic diastolic CHF (congestive heart failure) ([place])   . Chronic kidney disease    CKD stage 3  . Complication of anesthesia     "something they use make me itch for a couple of days."  . Depression   . Diabetes mellitus    type 2  . Duodenitis 01/18/2002  .  Fainting spell   . GERD (gastroesophageal reflux disease)   . Heart murmur    never has caused any problems  . Hiatal hernia 08/08/2008, 01/18/2002  . History of pneumonia    x 2  . Hyperlipidemia   . Hypertension   . Liver cancer ([place]) 08/2018  . Lymphedema 2017   Right arm    SURGICAL HISTORY: Past Surgical History:  Procedure Laterality Date  . AXILLARY SURGERY     cyst removal, right  . COLONOSCOPY  09/23/2018   Dr. Havery Moros - polyps  . EYE SURGERY Bilateral    cataracts to remove  . LAPAROSCOPY N/A 11/17/2018   Procedure: LAPAROSCOPY DIAGNOSTIC, INTRAOPERATIVE ULTRASOUND, PERITONEAL BIOPSIES;  Surgeon: [name], MD;  Location: [place];  Service: General;  Laterality: N/A;  GENERAL AND EPIDURAL  . LIVER BIOPSY  08/2018   Dr. Lindwood Coke  . MASTECTOMY  1992   right, with flap  . NM MYOCAR PERF WALL MOTION  06/11/2009   Protocol:[place], post stress EF58%, EKG negative for ischemia, low risk  . PORTACATH PLACEMENT N/A 12/02/2018   Procedure: INSERTION PORT-A-CATH;  Surgeon: [name], MD;  Location: [place];  Service: General;  Laterality: N/A;  . RECONSTRUCTION BREAST W/ TRAM FLAP Right   . TONSILLECTOMY    . TRANSTHORACIC ECHOCARDIOGRAM  12/24/2009   LVEF =>55%, normal study  . UPPER GI ENDOSCOPY      I have reviewed the social history and family history with the patient and they are unchanged from previous note.  ALLERGIES:  has No Known Allergies.  MEDICATIONS:  Current Outpatient Medications  Medication Sig Dispense Refill  . amLODipine (NORVASC) 10 MG tablet Take 1 tablet (10 mg total) by mouth daily. 90 tablet 3  . aspirin 81 MG tablet Take 81 mg by mouth daily.     . carvedilol (COREG) 25 MG tablet Take 1 tablet (25 mg total) by mouth 2 (two) times daily with a meal. 180 tablet 3  . furosemide (LASIX) 40 MG tablet Take 1  tablet (40 mg total) by mouth daily. 30 tablet 11  . glucose blood test strip 1 each by Other route 2 (two) times daily. Use Onetouch verio test strips as instructed to check blood sugar twice daily. 100 each 2  . hydrALAZINE (APRESOLINE) 100 MG tablet Take 1 tablet (100 mg total) by mouth every 8 (eight) hours. 90 tablet 3  . lansoprazole (PREVACID) 15 MG capsule Take 1 capsule (15 mg total) by mouth daily. 90 capsule 1  . loratadine (CLARITIN) 10 MG tablet Take 10 mg by mouth daily as needed for allergies.    . Omega 3 1000 MG CAPS Take 2,000 mg by mouth daily.     . potassium chloride SA (KLOR-CON) 10 MEQ tablet Take 1 tablet (10 mEq total) by mouth daily.    . rosuvastatin (CRESTOR) 20 MG tablet Take 1 tablet (20 mg total) by mouth daily. 90 tablet 3  . sitaGLIPtin (JANUVIA) 100 MG tablet Take 1 tablet (100 mg total) by mouth daily. 90 tablet 4  . valsartan (DIOVAN) 320 MG tablet Take 1 tablet (320 mg total) by mouth daily. 90 tablet 3   Current Facility-Administered Medications  Medication Dose Route Frequency Provider Last Rate Last Admin  . triamcinolone acetonide (KENALOG) 10 MG/ML injection 10 mg  10 mg Other Once Harriet Masson, DPM        PHYSICAL EXAMINATION: ECOG PERFORMANCE STATUS: 2 - Symptomatic, <50% confined to bed  Vitals:   02/29/20 1009  BP: (!) 148/68  Pulse: 69  Resp: 18  Temp: (!) 96.7 F (35.9 C)  SpO2: 100%   Filed Weights   02/29/20 1009  Weight: 119 lb 9.6 oz (54.3 kg)    GENERAL:alert, no distress and comfortable SKIN: Palms with hyperpigmentation.  No rash EYES: sclera clear LYMPH:  no palpable cervical or supraclavicular lymphadenopathy  LUNGS: clear with normal breathing effort HEART: regular rate & rhythm, no lower extremity edema ABDOMEN:abdomen soft, non-tender and normal bowel sounds NEURO: alert & oriented x 3 with fluent speech.  Significantly decreased peripheral vibratory sense over the fingertips per tuning fork exam PAC without  erythema  LABORATORY DATA:  I have reviewed the data as listed CBC Latest Ref Rng & Units 02/29/2020 02/05/2020 01/18/2020  WBC 4.0 - 10.5 K/uL 6.6 6.1 -  Hemoglobin 12.0 - 15.0 g/dL 9.9(L) 9.6(L) 12.6  Hematocrit 36.0 - 46.0 % 30.9(L) 30.6(L) 37.0  Platelets 150 - 400 K/uL 197 222 -     CMP Latest Ref Rng & Units 02/29/2020 02/05/2020 01/29/2020  Glucose 70 - 99 mg/dL 96 84 85  BUN 8 - 23 mg/dL 59(H) 42(H) 37(H)  Creatinine 0.44 - 1.00 mg/dL 1.83(H) 1.70(H) 1.80(H)  Sodium 135 - 145 mmol/L 140 140 141  Potassium 3.5 - 5.1 mmol/L 4.7 4.8 5.2  Chloride 98 - 111 mmol/L 110 110 106  CO2 22 - 32 mmol/L 22 19(L) 22  Calcium 8.9 - 10.3 mg/dL 8.9 8.4(L) 8.7  Total Protein 6.5 - 8.1 g/dL 7.6 7.4 -  Total Bilirubin 0.3 - 1.2 mg/dL 0.3 0.3 -  Alkaline Phos 38 - 126 U/L 65 71 -  AST 15 - 41 U/L 19 25 -  ALT 0 - 44 U/L 14 7 -      RADIOGRAPHIC STUDIES: I have personally reviewed the radiological images as listed and agreed with the findings in the report. No results found.   ASSESSMENT & PLAN: AVIN UPPERMAN [id number]   1.Intrahepatic cholangiocarcinoma, cT1N0M1,with peritoneal metastasis, MSS, IDH1 mutation (+) -Diagnosed in 09/2018 biopsy of her liver mass shows adenocarcinoma,most consistent withcholangiocarcinoma -09/29/18 PET scanshowedno evidence of distant metastasis. -Unfortunately she was found to have peritoneal metastasis to right diaphragm and surgery was aborted. -HerCT AP from 9/15/20shows dominate liver mass stable to mild increase, mild nodularity of both adrenal glands which warrants being monitored.No visible peritoneal metastasis on CT scan. -Given her metastatic cancer and ineligibility forsurgery,she began first-line systemic chemotherapy for disease control. -FO resultsshowed MSI stable disease, IDH mutation positive.Consider IDH 1 inhibitor in the future.Not a candidate for immunotherapy -She beganfirst lineoxaliplatin(due to  CKD)and gemcitabine every 2 weeks on 11/30/2018 -she responded very well to treatment and tolerated without significant toxicities except progressive neuropathy. Oxali was dose reduced and eventually stopped after cycle 11. She received single agent gemcitabine with cycle 12.Cisplatin added with C13 on 05/17/19 but neuropathy worsened and stopped on 06/14/19.  -She has been receiving single agent gemcitabine every 2 weeks starting from 06/28/19 C16,tolerating well.PET on 09/15/2019 showed stable disease.  -Her PET from 12/18/2019 showed no hypermetabolic activity to suggest active/recurrent malignancy.  Last dose chemo 12/27/2019 and hospitalized twice recently for recurrent CHF -she has been on chemo holiday since 12/2019 given her excellent response on PET scan and low tolerance to chemo  2.  Recurrent CHF, hospitalizations -Most recent 11/4-11/8, recovered -On carvedilol, hydralazine, valsartan, amlodipine, furosemide -Continue follow-up with cardiology  3.  Grade 3 CIPN -secondary to Oxaliplatin, eventually dc'd after cycle 11 -started cisplatin on 05/17/19, neuropathy  worsened in her feet with unsteady gait and functional limitations, decreased vibratory sense on tuning fork exam 05/31/19.Cisplatin was dose reducedthen eventually stopped on 06/14/19. -low efficacy of gabapentin and cymbalta, also tried lyrica briefly.She completed PT for balance. Ambulates with cane to avoid fall -She has functional difficulties and ambulates with a cane -Follow-up open with Dr. Mickeal Skinner, pt did not find helpful -She was previously referred to neurologist Dr. Marcial Pacas back in 09/2019, ready now to pursue this referral and I have asked our scheduler to arrange second opinion   4. H/o right breast cancer, Geneticsnegative -s/p mastectomy in 1992, per patient did not require adjuvant therapy  -previouslyfollowed by Dr. Jana Hakim -VUS of gene APC;genetics otherwise normal  5.Comorbidities:DM, HTN,  hiatal hernia, GERD, renal artery stenosis -Follow-up with PCP, nephrology, and Dr. Dwyane Dee -Crfluctuates, overall much improved fromJune- Sept this year (1.78 at highest). -On fluid restriction for CHF, on lasix per Dr. Sallyanne Kuster  6. Family support  -she has a son and daughterwho are supportive. -patient remains independent   7.Goal of care discussion  -Wefrequently discussthe goal of her treatment is palliative and that hercancer is not curable with chemo alone at this stage. The goal is to control her disease, prolong her life, and give her good quality of life. She understands -she is full code now   Disposition: Donna May appears stable.  On chemo holiday since 12/2019. Today's labs are stable. There is no clinical evidence of disease progression.  Her main concern is peripheral neuropathy, I have referred her back to neurology for a second opinion.  She is being scheduled for restaging PET scan for new baseline in the next few weeks, she will return for follow-up after 2 discuss results in the plan.   Orders Placed This Encounter  Procedures  . NM PET Image Restag (PS) Skull Base To Thigh    Standing Status:   Future    Standing Expiration Date:   02/28/2021    Order Specific Question:   If indicated for the ordered procedure, I authorize the administration of a radiopharmaceutical per Radiology protocol    Answer:   Yes    Order Specific Question:   Preferred imaging location?    Answer:   [facility]   All questions were answered. The patient knows to call the clinic with any problems, questions or concerns. No barriers to learning were detected.     Alla Feeling, NP 02/29/20

## 2020-02-29 ENCOUNTER — Inpatient Hospital Stay: Payer: Medicare Other | Attending: Adult Health | Admitting: Nurse Practitioner

## 2020-02-29 ENCOUNTER — Inpatient Hospital Stay: Payer: Medicare Other

## 2020-02-29 ENCOUNTER — Encounter: Payer: Self-pay | Admitting: Nurse Practitioner

## 2020-02-29 ENCOUNTER — Other Ambulatory Visit: Payer: Self-pay

## 2020-02-29 VITALS — BP 148/68 | HR 69 | Temp 96.7°F | Resp 18 | Ht 65.0 in | Wt 119.6 lb

## 2020-02-29 DIAGNOSIS — I700 Atherosclerosis of aorta: Secondary | ICD-10-CM | POA: Diagnosis not present

## 2020-02-29 DIAGNOSIS — C786 Secondary malignant neoplasm of retroperitoneum and peritoneum: Secondary | ICD-10-CM | POA: Diagnosis not present

## 2020-02-29 DIAGNOSIS — I130 Hypertensive heart and chronic kidney disease with heart failure and stage 1 through stage 4 chronic kidney disease, or unspecified chronic kidney disease: Secondary | ICD-10-CM | POA: Diagnosis not present

## 2020-02-29 DIAGNOSIS — Z79899 Other long term (current) drug therapy: Secondary | ICD-10-CM | POA: Diagnosis not present

## 2020-02-29 DIAGNOSIS — Z9011 Acquired absence of right breast and nipple: Secondary | ICD-10-CM | POA: Diagnosis not present

## 2020-02-29 DIAGNOSIS — F32A Depression, unspecified: Secondary | ICD-10-CM | POA: Insufficient documentation

## 2020-02-29 DIAGNOSIS — E1122 Type 2 diabetes mellitus with diabetic chronic kidney disease: Secondary | ICD-10-CM | POA: Insufficient documentation

## 2020-02-29 DIAGNOSIS — N183 Chronic kidney disease, stage 3 unspecified: Secondary | ICD-10-CM | POA: Diagnosis not present

## 2020-02-29 DIAGNOSIS — I5032 Chronic diastolic (congestive) heart failure: Secondary | ICD-10-CM | POA: Diagnosis not present

## 2020-02-29 DIAGNOSIS — D649 Anemia, unspecified: Secondary | ICD-10-CM

## 2020-02-29 DIAGNOSIS — G620 Drug-induced polyneuropathy: Secondary | ICD-10-CM

## 2020-02-29 DIAGNOSIS — Z95828 Presence of other vascular implants and grafts: Secondary | ICD-10-CM

## 2020-02-29 DIAGNOSIS — Z7984 Long term (current) use of oral hypoglycemic drugs: Secondary | ICD-10-CM | POA: Insufficient documentation

## 2020-02-29 DIAGNOSIS — C221 Intrahepatic bile duct carcinoma: Secondary | ICD-10-CM | POA: Diagnosis not present

## 2020-02-29 DIAGNOSIS — C50919 Malignant neoplasm of unspecified site of unspecified female breast: Secondary | ICD-10-CM | POA: Diagnosis not present

## 2020-02-29 DIAGNOSIS — T451X5A Adverse effect of antineoplastic and immunosuppressive drugs, initial encounter: Secondary | ICD-10-CM

## 2020-02-29 LAB — CBC WITH DIFFERENTIAL (CANCER CENTER ONLY)
Abs Immature Granulocytes: 0.02 10*3/uL (ref 0.00–0.07)
Basophils Absolute: 0 10*3/uL (ref 0.0–0.1)
Basophils Relative: 1 %
Eosinophils Absolute: 0.1 10*3/uL (ref 0.0–0.5)
Eosinophils Relative: 2 %
HCT: 30.9 % — ABNORMAL LOW (ref 36.0–46.0)
Hemoglobin: 9.9 g/dL — ABNORMAL LOW (ref 12.0–15.0)
Immature Granulocytes: 0 %
Lymphocytes Relative: 16 %
Lymphs Abs: 1 10*3/uL (ref 0.7–4.0)
MCH: 31.3 pg (ref 26.0–34.0)
MCHC: 32 g/dL (ref 30.0–36.0)
MCV: 97.8 fL (ref 80.0–100.0)
Monocytes Absolute: 0.7 10*3/uL (ref 0.1–1.0)
Monocytes Relative: 10 %
Neutro Abs: 4.7 10*3/uL (ref 1.7–7.7)
Neutrophils Relative %: 71 %
Platelet Count: 197 10*3/uL (ref 150–400)
RBC: 3.16 MIL/uL — ABNORMAL LOW (ref 3.87–5.11)
RDW: 13.5 % (ref 11.5–15.5)
WBC Count: 6.6 10*3/uL (ref 4.0–10.5)
nRBC: 0 % (ref 0.0–0.2)

## 2020-02-29 LAB — MAGNESIUM: Magnesium: 2.4 mg/dL (ref 1.7–2.4)

## 2020-02-29 LAB — CMP (CANCER CENTER ONLY)
ALT: 14 U/L (ref 0–44)
AST: 19 U/L (ref 15–41)
Albumin: 3.2 g/dL — ABNORMAL LOW (ref 3.5–5.0)
Alkaline Phosphatase: 65 U/L (ref 38–126)
Anion gap: 8 (ref 5–15)
BUN: 59 mg/dL — ABNORMAL HIGH (ref 8–23)
CO2: 22 mmol/L (ref 22–32)
Calcium: 8.9 mg/dL (ref 8.9–10.3)
Chloride: 110 mmol/L (ref 98–111)
Creatinine: 1.83 mg/dL — ABNORMAL HIGH (ref 0.44–1.00)
GFR, Estimated: 27 mL/min — ABNORMAL LOW (ref >60)
Glucose, Bld: 96 mg/dL (ref 70–99)
Potassium: 4.7 mmol/L (ref 3.5–5.1)
Sodium: 140 mmol/L (ref 135–145)
Total Bilirubin: 0.3 mg/dL (ref 0.3–1.2)
Total Protein: 7.6 g/dL (ref 6.5–8.1)

## 2020-02-29 LAB — SAMPLE TO BLOOD BANK

## 2020-02-29 MED ORDER — SODIUM CHLORIDE 0.9% FLUSH
10.0000 mL | Freq: Once | INTRAVENOUS | Status: AC
  Administered 2020-02-29: 10 mL
  Filled 2020-02-29: qty 10

## 2020-02-29 MED ORDER — HEPARIN SOD (PORK) LOCK FLUSH 100 UNIT/ML IV SOLN
500.0000 [IU] | Freq: Once | INTRAVENOUS | Status: AC
  Administered 2020-02-29: 500 [IU]
  Filled 2020-02-29: qty 5

## 2020-02-29 NOTE — Patient Instructions (Signed)

## 2020-03-03 ENCOUNTER — Other Ambulatory Visit: Payer: Self-pay | Admitting: Cardiovascular Disease

## 2020-03-12 ENCOUNTER — Other Ambulatory Visit: Payer: Self-pay | Admitting: Cardiovascular Disease

## 2020-03-14 ENCOUNTER — Ambulatory Visit (HOSPITAL_COMMUNITY)
Admission: RE | Admit: 2020-03-14 | Discharge: 2020-03-14 | Disposition: A | Discharge status: Home / Self Care | Payer: Medicare Other | Source: Ambulatory Visit | Attending: Nurse Practitioner | Admitting: Nurse Practitioner

## 2020-03-14 ENCOUNTER — Other Ambulatory Visit: Payer: Self-pay

## 2020-03-14 DIAGNOSIS — C221 Intrahepatic bile duct carcinoma: Secondary | ICD-10-CM | POA: Diagnosis not present

## 2020-03-14 DIAGNOSIS — K7689 Other specified diseases of liver: Secondary | ICD-10-CM | POA: Diagnosis not present

## 2020-03-14 DIAGNOSIS — K828 Other specified diseases of gallbladder: Secondary | ICD-10-CM | POA: Diagnosis not present

## 2020-03-14 LAB — GLUCOSE, CAPILLARY: Glucose-Capillary: 74 mg/dL (ref 70–99)

## 2020-03-14 MED ORDER — FLUDEOXYGLUCOSE F - 18 (FDG) INJECTION
5.9000 | Freq: Once | INTRAVENOUS | Status: AC | PRN
  Administered 2020-03-14: 5.9 via INTRAVENOUS

## 2020-03-20 NOTE — Progress Notes (Signed)
[place]   [phone] [phone]   Clinic Follow up Note   Patient Care Team: [name], MD as PCP - General (Family Medicine) [name], [name], MD as PCP - Cardiology (Cardiology) [name], [name], MD as Consulting Physician (Cardiology) [name], MD as Consulting Physician (Obstetrics and Gynecology) [name], [name], NP as Nurse Practitioner (Hematology and Oncology) [facility] [name], MD as Consulting Physician (Hematology) [name], [name], MD as Consulting Physician (Gastroenterology) Virgina Evener, Dawn, RN (Inactive) as Oncology Nurse Navigator [name], MD as Consulting Physician (General Surgery) [name], NP as [place] Management  Date of Service:  03/22/2020  CHIEF COMPLAINT: F/u ofcholangiocarcinomaof liver  SUMMARY OF ONCOLOGIC HISTORY: Oncology History Overview Note  Cancer Staging Intrahepatic cholangiocarcinoma ([place]) Staging form: Intrahepatic Bile Duct, AJCC 8th Edition - Clinical stage from 09/23/2018: Stage IB (cT1b, cN0, cM0) - Signed by [name], MD on 10/06/2018    Malignant neoplasm of female breast ([place])  11/06/2018 Genetic Testing   Negative genetic testing on the [facility] Common Hereditary Cancers panel. A variant of uncertain significance was identified in one of her APC genes, called c.1243G>A (p.Ala415Thr).  The Common Hereditary Cancers Panel offered by [facility] includes sequencing and/or deletion duplication testing of the following 48 genes: APC, ATM, AXIN2, BARD1, BMPR1A, BRCA1, BRCA2, BRIP1, CDH1, CDK4, CDKN2A (p14ARF), CDKN2A (p16INK4a), CHEK2, CTNNA1, DICER1, EPCAM (Deletion/duplication testing only), GREM1 (promoter region deletion/duplication testing only), KIT, MEN1, MLH1, MSH2, MSH3, MSH6, MUTYH, NBN, NF1, NHTL1, PALB2, PDGFRA, PMS2, POLD1, POLE, PTEN, RAD50, RAD51C, RAD51D, RNF43, SDHB, SDHC, SDHD, SMAD4, SMARCA4. STK11, TP53,  TSC1, TSC2, and VHL.  The following genes were evaluated for sequence changes only: SDHA and HOXB13 c.251G>A variant only.    Intrahepatic cholangiocarcinoma ([place])  09/07/2018 Imaging   CT Chest IMPRESSION: 1. New, enhancing mass involving segment 4 of the liver and fundus of gallbladder is concerning for malignancy. This may represent either metastatic disease from breast cancer or neoplasm primary to the liver or hepatic biliary tree. Further evaluation with contrast enhanced CT of the abdomen and pelvis is recommended. 2. No findings to suggest metastatic disease within the chest. 3.  Aortic Atherosclerosis (ICD10-I70.0). 4. Coronary artery calcifications.   09/13/2018 Pathology Results   Diagnosis Liver, needle/core biopsy - ADENOCARCINOMA. Microscopic Comment Immunohistochemistry for CK7 is positive. CK20, TTF1, CDX-2, GATA-3, PAX 8, Qualitative ER, p63 and CK5/6 are negative. The provided clinical history of remote mammary carcinoma is noted. Based on the morphology and immunophenotype of the adenocarcinoma observed in this specimen, primary cholangiocarcinoma is favored. Clinical and radiologic correlation are  encouraged. Results reported to [employer] on 09/15/2018. Intradepartmental consultation (Dr. Vic Ripper).   09/13/2018 Initial Diagnosis   Cholangiocarcinoma ([place])   09/23/2018 Procedure   Colonoscopy by Dr. Havery Moros 09/23/18  IMPRESSION - Two 3 to 4 mm polyps in the ascending colon, removed with a cold snare. Resected and retrieved. - Five 3 to 5 mm polyps in the transverse colon, removed with a cold snare. Resected and retrieved. - One 5 mm polyp at the splenic flexure, removed with a cold snare. Resected and retrieved. - Three 3 to 5 mm polyps in the sigmoid colon, removed with a cold snare. Resected and retrieved. - The examination was otherwise normal. Upper Endopscy by Dr. Havery Moros 09/23/18  IMPRESSION - Esophagogastric landmarks identified. - 2 cm hiatal  hernia. - Normal esophagus otherwise. - A single gastric polyp. Resected and retrieved. - Mild gastritis. Biopsied. - Normal duodenal bulb and  second portion of the duodenum.   09/23/2018 Pathology Results   Diagnosis 09/23/18 1. Surgical [P], duodenum - BENIGN SMALL BOWEL MUCOSA. - NO ACTIVE INFLAMMATION OR VILLOUS ATROPHY IDENTIFIED. 2. Surgical [P], stomach, polyp - HYPERPLASTIC POLYP(S). - THERE IS NO EVIDENCE OF MALIGNANCY. 3. Surgical [P], gastric antrum and gastric body - CHRONIC INACTIVE GASTRITIS. - THERE IS NO EVIDENCE OF HELICOBACTER-PYLORI, DYSPLASIA, OR MALIGNANCY. - SEE COMMENT. 4. Surgical [P], colon, sigmoid, splenic flexure, transverse and ascending, polyp (9) - TUBULAR ADENOMA(S). - SESSILE SERRATED POLYP WITHOUT CYTOLOGIC DYSPLASIA. - HIGH GRADE DYSPLASIA IS NOT IDENTIFIED. 5. Surgical [P], colon, sigmoid, polyp (2) - HYPERPLASTIC POLYP(S). - THERE IS NO EVIDENCE OF MALIGNANCY.   09/23/2018 Cancer Staging   Staging form: Intrahepatic Bile Duct, AJCC 8th Edition - Clinical stage from 09/23/2018: Stage IB (cT1b, cN0, cM0) - Signed by [name], MD on 10/06/2018   09/29/2018 PET scan   PET 09/29/18 IMPRESSION: 1. Hypermetabolic mass in the RIGHT hepatic lobe consistent with biopsy proven adenocarcinoma. No additional liver metastasis. 2. No evidence of local breast cancer recurrence in the RIGHT breast or RIGHT axilla. 3. Mild bilateral hypermetabolic adrenal glands is favored benign hyperplasia. 4. No evidence of additional metastatic disease on skull base to thigh FDG PET scan.   11/06/2018 Genetic Testing   Negative genetic testing on the [facility] Common Hereditary Cancers panel. A variant of uncertain significance was identified in one of her APC genes, called c.1243G>A (p.Ala415Thr).  The Common Hereditary Cancers Panel offered by [facility] includes sequencing and/or deletion duplication testing of the following 48 genes: APC, ATM, AXIN2, BARD1, BMPR1A, BRCA1,  BRCA2, BRIP1, CDH1, CDK4, CDKN2A (p14ARF), CDKN2A (p16INK4a), CHEK2, CTNNA1, DICER1, EPCAM (Deletion/duplication testing only), GREM1 (promoter region deletion/duplication testing only), KIT, MEN1, MLH1, MSH2, MSH3, MSH6, MUTYH, NBN, NF1, NHTL1, PALB2, PDGFRA, PMS2, POLD1, POLE, PTEN, RAD50, RAD51C, RAD51D, RNF43, SDHB, SDHC, SDHD, SMAD4, SMARCA4. STK11, TP53, TSC1, TSC2, and VHL.  The following genes were evaluated for sequence changes only: SDHA and HOXB13 c.251G>A variant only.    11/17/2018 Pathology Results   Diagnosis 11/17/18 1. Soft tissue, biopsy, Diaphragmatic nodules - METASTATIC ADENOCARCINOMA, CONSISTENT WITH PATIENT'S CLINICAL HISTORY OF CHOLANGIOCARCINOMA. SEE NOTE 2. Liver, biopsy, Left - LIVER PARENCHYMA WITH A BENIGN FIBROTIC NODULE - NO EVIDENCE OF MALIGNANCY 3. Stomach, biopsy - BENIGN PAPILLARY MESOTHELIAL HYPERPLASIA - NO EVIDENCE OF MALIGNANCY   11/29/2018 Imaging   CT CAP WO Contrast  IMPRESSION: 1. Dominant liver mass appears grossly stable from 09/29/2018. Additional liver lesions are too small to characterize but were not shown to be hypermetabolic on PET. 2. Mild nodularity of both adrenal glands with associated hypermetabolism on 09/29/2018. Continued attention on follow-up exams is warranted. 3. Small right lower lobe nodules, stable from 09/07/2018. Again, attention on follow-up is recommended. 4. Trace bilateral pleural fluid. 5. Aortic atherosclerosis (ICD10-170.0). Coronary artery calcification. 6. Enlarged pulmonic trunk, indicative of pulmonary arterial hypertension.     11/30/2018 - 06/14/2019 Chemotherapy   First line chemo Oxaliplatin and gemcitabine q2weeks starting 11/30/18. Stopped Oxaliplatin on 05/03/19 due to worsening neuropathy. Added Cisplatin with C13 on 05/17/19 and stopped on C15 06/14/19 due to neuropathy. Reduced to maintenance single agent Gemcitabine on 06/28/19.    01/20/2019 Imaging   CT CAP IMPRESSION: restaging  1. Mild interval  increase in size of dominant liver mass involving segment 4 and segment 5. 2. No significant or progressive adrenal nodularity identified to suggest metastatic disease. 3. Unchanged appearance of small right lower lobe and lingular lung nodules, nonspecific.  03/21/2019 PET scan   IMPRESSION: 1. Large right hepatic mass with SUV uptake near background hepatic activity, dramatic response to therapy, also with decrease in size when compared to the prior study. 2. Signs of prior right mastectomy and axillary dissection as before. 3. Left adrenal activity in no longer above the level of activity seen in the contralateral, right adrenal gland or in the liver. Attention on follow-up. 4. No new signs of disease.   06/27/2019 PET scan   IMPRESSION: 1. Further decrease in size of a dominant hepatic mass which is non FDG avid. 2. No evidence of metastatic disease. 3. No evidence of left adrenal hypermetabolism or mass. 4. Incidental findings, including: Uterine fibroids. Coronary artery atherosclerosis. Aortic Atherosclerosis (ICD10-I70.0). Pulmonary artery enlargement suggests pulmonary arterial hypertension.   06/28/2019 -  Chemotherapy   Maintenance single agent Gemcitabine q2weeks  starting on 06/28/19 with C16.    09/15/2019 PET scan   IMPRESSION: 1. In the region of regional tumor at the junction of the right and left hepatic lobe, there is continued hypodensity and overall activity slightly less than that of the surrounding normal liver, indicating effective response to therapy. No current worrisome hypermetabolic lesion is identified in the liver or elsewhere. 2. Chronic asymmetric edema in the subcutaneous tissues of the right upper extremity, nonspecific. The patient has had a prior right mastectomy and right axillary dissection. 3. Other imaging findings of potential clinical significance: Aortic Atherosclerosis (ICD10-I70.0). Coronary atherosclerosis. Mild cardiomegaly. Small  right and trace left pleural effusions. Subcutaneous and mild mesenteric edema. Suspected uterine fibroids.   12/18/2019 PET scan   IMPRESSION: 1. No significant abnormal activity to suggest active/recurrent malignancy. 2. Lingual tonsillar activity is mildly increased from prior but probably incidental/physiologic, attention on follow up suggested. 3. Other imaging findings of potential clinical significance: Third spacing of fluid with trace ascites, mesenteric edema, subcutaneous edema, and possibly subtle pulmonary edema. Mild cardiomegaly. Aortic Atherosclerosis (ICD10-I70.0). Coronary atherosclerosis. Right ovarian dermoid. Calcified uterine fibroids. Renal cysts. Right glenohumeral arthropathy. Mild to moderate scoliosis. Suspected pulmonary arterial hypertension.   03/14/2020 PET scan   IMPRESSION: 1. New small focus of hypermetabolism in segment 4A of the liver worrisome for new metastatic focus. 2. No other findings for metastatic disease involving the neck, chest, abdomen or pelvis.        CURRENT THERAPY:  Maintenance single agent Gemcitabine q2weeks starting on 06/28/19 with C16.On chemo break since 12/27/19.   INTERVAL HISTORY:  Donna May is here for a follow up. She presents to the clinic alone. She notes she is doing well. She notes her neuropathy is exacerbated in the cold weather. She still has numbness in feet and her hands. She notes she is able to be independent at home, but is open to more help at home. She has tried OT and PT. She notes occasional discomfort in abdomen and she related this to BM. She reviewed her medication list with me. She has Gabapentin $RemoveBefore'100mg'bwGuwETDFLSPT$  but tries not to take many because of drowsiness. She notes she has started cholesterol medication for the past 3 weeks.     REVIEW OF SYSTEMS:   Constitutional: Denies fevers, chills or abnormal weight loss Eyes: Denies blurriness of vision Ears, nose, mouth, throat, and face: Denies mucositis  or sore throat Respiratory: Denies cough, dyspnea or wheezes Cardiovascular: Denies palpitation, chest discomfort or lower extremity swelling Gastrointestinal:  Denies nausea, heartburn or change in bowel habits Skin: Denies abnormal skin rashes Lymphatics: Denies new lymphadenopathy or easy bruising Neurological: (+)  Neuropathy in hands and feet, mostly stable.  Behavioral/Psych: Mood is stable, no new changes  All other systems were reviewed with the patient and are negative.  MEDICAL HISTORY:  Past Medical History:  Diagnosis Date  . Anemia   . Anxiety   . Arthritis   . Breast cancer ([place]) 1992  . Cataract    Bilateral eyes - surgery to remove  . Chronic diastolic CHF (congestive heart failure) ([place])   . Chronic kidney disease    CKD stage 3  . Complication of anesthesia    "something they use make me itch for a couple of days."  . Depression   . Diabetes mellitus    type 2  . Duodenitis 01/18/2002  . Fainting spell   . GERD (gastroesophageal reflux disease)   . Heart murmur    never has caused any problems  . Hiatal hernia 08/08/2008, 01/18/2002  . History of pneumonia    x 2  . Hyperlipidemia   . Hypertension   . Liver cancer ([place]) 08/2018  . Lymphedema 2017   Right arm    SURGICAL HISTORY: Past Surgical History:  Procedure Laterality Date  . AXILLARY SURGERY     cyst removal, right  . COLONOSCOPY  09/23/2018   Dr. Havery Moros - polyps  . EYE SURGERY Bilateral    cataracts to remove  . LAPAROSCOPY N/A 11/17/2018   Procedure: LAPAROSCOPY DIAGNOSTIC, INTRAOPERATIVE ULTRASOUND, PERITONEAL BIOPSIES;  Surgeon: [name], MD;  Location: [facility];  Service: General;  Laterality: N/A;  GENERAL AND EPIDURAL  . LIVER BIOPSY  08/2018   Dr. Lindwood Coke  . MASTECTOMY  1992   right, with flap  . NM MYOCAR PERF WALL MOTION  06/11/2009   Protocol:[place], post stress EF58%, EKG negative for ischemia, low risk  . PORTACATH PLACEMENT N/A 12/02/2018   Procedure: INSERTION  PORT-A-CATH;  Surgeon: [name], MD;  Location: [place];  Service: General;  Laterality: N/A;  . RECONSTRUCTION BREAST W/ TRAM FLAP Right   . TONSILLECTOMY    . TRANSTHORACIC ECHOCARDIOGRAM  12/24/2009   LVEF =>55%, normal study  . UPPER GI ENDOSCOPY      I have reviewed the social history and family history with the patient and they are unchanged from previous note.  ALLERGIES:  has No Known Allergies.  MEDICATIONS:  Current Outpatient Medications  Medication Sig Dispense Refill  . amLODipine (NORVASC) 10 MG tablet Take 1 tablet (10 mg total) by mouth daily. 90 tablet 3  . aspirin 81 MG tablet Take 81 mg by mouth daily.     . carvedilol (COREG) 25 MG tablet Take 1 tablet (25 mg total) by mouth 2 (two) times daily with a meal. 180 tablet 3  . furosemide (LASIX) 40 MG tablet Take 1 tablet (40 mg total) by mouth daily. 30 tablet 11  . glucose blood test strip 1 each by Other route 2 (two) times daily. Use Onetouch verio test strips as instructed to check blood sugar twice daily. 100 each 2  . hydrALAZINE (APRESOLINE) 100 MG tablet Take 1 tablet (100 mg total) by mouth every 8 (eight) hours. 90 tablet 3  . KLOR-CON M10 10 MEQ tablet TAKE 1 TABLET BY MOUTH EVERY DAY 90 tablet 1  . lansoprazole (PREVACID) 15 MG capsule Take 1 capsule (15 mg total) by mouth daily. 90 capsule 1  . loratadine (CLARITIN) 10 MG tablet Take 10 mg by mouth daily as needed for allergies.    . Omega 3 1000 MG CAPS Take 2,000  mg by mouth daily.     . rosuvastatin (CRESTOR) 20 MG tablet Take 1 tablet (20 mg total) by mouth daily. 90 tablet 3  . sitaGLIPtin (JANUVIA) 100 MG tablet Take 1 tablet (100 mg total) by mouth daily. 90 tablet 4  . valsartan (DIOVAN) 320 MG tablet Take 1 tablet (320 mg total) by mouth daily. 90 tablet 3   Current Facility-Administered Medications  Medication Dose Route Frequency Provider Last Rate Last Admin  . triamcinolone acetonide (KENALOG) 10 MG/ML injection 10 mg  10 mg Other Once  Harriet Masson, DPM        PHYSICAL EXAMINATION: ECOG PERFORMANCE STATUS: 2 - Symptomatic, <50% confined to bed  Vitals:   03/22/20 0841  BP: (!) 149/60  Pulse: 63  Resp: 16  Temp: (!) 97 F (36.1 C)  SpO2: 100%   Filed Weights   03/22/20 0841  Weight: 119 lb 1.6 oz (54 kg)    GENERAL:alert, no distress and comfortable SKIN: skin color, texture, turgor are normal, no rashes or significant lesions EYES: normal, Conjunctiva are pink and non-injected, sclera clear Musculoskeletal:no cyanosis of digits and no clubbing  NEURO: alert & oriented x 3 with fluent speech  LABORATORY DATA:  I have reviewed the data as listed CBC Latest Ref Rng & Units 03/22/2020 02/29/2020 02/05/2020  WBC 4.0 - 10.5 K/uL 3.9(L) 6.6 6.1  Hemoglobin 12.0 - 15.0 g/dL 9.8(L) 9.9(L) 9.6(L)  Hematocrit 36.0 - 46.0 % 31.6(L) 30.9(L) 30.6(L)  Platelets 150 - 400 K/uL 153 197 222     CMP Latest Ref Rng & Units 03/22/2020 02/29/2020 02/05/2020  Glucose 70 - 99 mg/dL 108(H) 96 84  BUN 8 - 23 mg/dL 43(H) 59(H) 42(H)  Creatinine 0.44 - 1.00 mg/dL 1.64(H) 1.83(H) 1.70(H)  Sodium 135 - 145 mmol/L 142 140 140  Potassium 3.5 - 5.1 mmol/L 4.3 4.7 4.8  Chloride 98 - 111 mmol/L 111 110 110  CO2 22 - 32 mmol/L 23 22 19(L)  Calcium 8.9 - 10.3 mg/dL 8.9 8.9 8.4(L)  Total Protein 6.5 - 8.1 g/dL 7.1 7.6 7.4  Total Bilirubin 0.3 - 1.2 mg/dL 0.3 0.3 0.3  Alkaline Phos 38 - 126 U/L 60 65 71  AST 15 - 41 U/L $Remo'19 19 25  'szdnB$ ALT 0 - 44 U/L $Remo'9 14 7      'HLHJF$ RADIOGRAPHIC STUDIES: I have personally reviewed the radiological images as listed and agreed with the findings in the report. No results found.   ASSESSMENT & PLAN:  RUSSIA SCHEIDERER is a 80 y.o. female with    1.Intrahepatic cholangiocarcinoma, cT1N0M1,with peritoneal metastasis, MSS, IDH1 mutation (+) -She was diagnosed in 08/2018. Her biopsy of her liver mass shows adenocarcinoma,most consistent withcholangiocarcinoma.Her EGD/Colonoscopy from 7/10/20was negativeand  09/29/18 PET scanshowedno evidence of distant metastasis. -She was brought to OR on 11/17/18,unfortunately she was found to have peritoneal metastasis to right diaphragm and surgery was aborted. -Given her metastatic cancer, Istarted her onsystemicchemo withFirst line chemoOxaliplatinand gemcitabine q2weeksto control her diseasebeginningon 11/30/18.Due to poor tolerance to Oxaliplatin and Cisplatin from neuropathy, she proceeded withagent Gemcitabineevery 2 weeksstarting 06/28/19 with C16.Dose was reduced to $RemoveBe'600mg'fZkqjejba$ /m2 since 09/20/19 due to anemia -Her 12/18/19 PET showed Nohypermetabolicactivity to suggest active/recurrentmalignancy. I discussed the prior metastatic lesionsof her diaphragm are not visibleon scans. I discussed it is possible she has reached near complete response.  -She was hospitalized again for CHF exacerbation, right after last dose of chemotherapy on December 27, 2019. She has been on chemo break since then.  -I personally reviewed  and discussed her PET from 03/14/20 which showed New small focus 81mm of hypermetabolism in segment 4A of the liver worrisome for new metastatic focus. Her previous liver lesions are not hypermetabolic. No other findings for metastatic disease involving the neck, chest, abdomen or pelvis. I personally reviewed with patient today.  -I discussed this new liver lesion is likely the beginnings of disease progression since she is on chemo break. I discussed the option of restarting chemo gemcitabine or watching with repeat scan in 2 months. She will think about her options.  -F/u in 4 weeks. If she wants to restart chemo sooner, I will arrange this.   2. Anemia,Secondary to chemoand cancer -Shepreviouslyrequiredblood transfusionsince 03/2019, continue as needed. Will continue prenatal vitamin. -Labs blood transfusion 01/08/20  3.Peripheral neuropathy, G3 -secondary to Oxaliplatinand Cisplatin (both d/c) -Due to low tolerance and not much  help, she stopped Lyrica,Gabapentinand Cymbalta.She can use Gabapentin $RemoveBefore'100mg'NCmDnXbRBIJZT$  as needed at night for tingling.  -She waspreviouslyseen by Dr Mickeal Skinner.She is now being seen byChiropractor Dr Page Spiro. -She remains to have decreased function overall.Stablewith occasional flares.Stable.  4. H/o right breast cancer, Geneticsnegative, Osteopenia -s/prightmastectomy in 1992, per patient did not require adjuvant therapy. She was last seen by Dr. Jana Hakim in 2017. -Her4/2019 DEXA shows mild osteopenia. F/u with Gyn about continuing Raloxifene. I do not think she has to continue at this point.  5.Comorbidities:DM,HTN,hiatal hernia, GERD, renal artery stenosis, recent pulmonary edema -Continue medications and f/u with PCP and cardiologist for management.  -Due to her uncontrolled hypertension, hydralazine was added, but it is difficult for her to take 3 times a day, she usually takes twice a day -She is on torsemide twice a week, and hydrochlorothiazide daily  6. Family support, Goal of Care Discussion -She has a son and daughterwho are often busy but help as needed. She notes they are aware of her condition. -The patient understands the goal of care is palliative.She is full code now  7.CKD stage III -Continue to monitor.Stable Cr at 1.2-15.   PLAN: -Lab today  -PET scan reviewed today, discussed the option of restart gemcitabine vs observation for 2 more months, she will call me with decision  -Lab, flush, f/u in 4 weeks, or sooner if she wants to restart chemo    No problem-specific Assessment & Plan notes found for this encounter.   No orders of the defined types were placed in this encounter.  All questions were answered. The patient knows to call the clinic with any problems, questions or concerns. No barriers to learning was detected. The total time spent in the appointment was 30 minutes.     [name], MD 03/22/2020   I, Joslyn Devon, am  acting as scribe for [name], MD.   I have reviewed the above documentation for accuracy and completeness, and I agree with the above.

## 2020-03-22 ENCOUNTER — Inpatient Hospital Stay: Payer: Medicare Other | Attending: Adult Health | Admitting: Hematology

## 2020-03-22 ENCOUNTER — Other Ambulatory Visit: Payer: Self-pay

## 2020-03-22 ENCOUNTER — Inpatient Hospital Stay: Payer: Medicare Other

## 2020-03-22 ENCOUNTER — Encounter: Payer: Self-pay | Admitting: Hematology

## 2020-03-22 VITALS — BP 149/60 | HR 63 | Temp 97.0°F | Resp 16 | Ht 65.0 in | Wt 119.1 lb

## 2020-03-22 DIAGNOSIS — C786 Secondary malignant neoplasm of retroperitoneum and peritoneum: Secondary | ICD-10-CM | POA: Insufficient documentation

## 2020-03-22 DIAGNOSIS — N183 Chronic kidney disease, stage 3 unspecified: Secondary | ICD-10-CM | POA: Diagnosis not present

## 2020-03-22 DIAGNOSIS — G620 Drug-induced polyneuropathy: Secondary | ICD-10-CM | POA: Diagnosis not present

## 2020-03-22 DIAGNOSIS — D649 Anemia, unspecified: Secondary | ICD-10-CM

## 2020-03-22 DIAGNOSIS — Z5111 Encounter for antineoplastic chemotherapy: Secondary | ICD-10-CM | POA: Insufficient documentation

## 2020-03-22 DIAGNOSIS — Z95828 Presence of other vascular implants and grafts: Secondary | ICD-10-CM

## 2020-03-22 DIAGNOSIS — T451X5A Adverse effect of antineoplastic and immunosuppressive drugs, initial encounter: Secondary | ICD-10-CM

## 2020-03-22 DIAGNOSIS — M8580 Other specified disorders of bone density and structure, unspecified site: Secondary | ICD-10-CM | POA: Diagnosis not present

## 2020-03-22 DIAGNOSIS — I701 Atherosclerosis of renal artery: Secondary | ICD-10-CM | POA: Insufficient documentation

## 2020-03-22 DIAGNOSIS — C221 Intrahepatic bile duct carcinoma: Secondary | ICD-10-CM | POA: Diagnosis not present

## 2020-03-22 DIAGNOSIS — I129 Hypertensive chronic kidney disease with stage 1 through stage 4 chronic kidney disease, or unspecified chronic kidney disease: Secondary | ICD-10-CM | POA: Insufficient documentation

## 2020-03-22 DIAGNOSIS — E1122 Type 2 diabetes mellitus with diabetic chronic kidney disease: Secondary | ICD-10-CM | POA: Insufficient documentation

## 2020-03-22 DIAGNOSIS — D6481 Anemia due to antineoplastic chemotherapy: Secondary | ICD-10-CM | POA: Insufficient documentation

## 2020-03-22 DIAGNOSIS — Z853 Personal history of malignant neoplasm of breast: Secondary | ICD-10-CM | POA: Diagnosis not present

## 2020-03-22 LAB — CBC WITH DIFFERENTIAL (CANCER CENTER ONLY)
Abs Immature Granulocytes: 0.01 10*3/uL (ref 0.00–0.07)
Basophils Absolute: 0 10*3/uL (ref 0.0–0.1)
Basophils Relative: 1 %
Eosinophils Absolute: 0.1 10*3/uL (ref 0.0–0.5)
Eosinophils Relative: 4 %
HCT: 31.6 % — ABNORMAL LOW (ref 36.0–46.0)
Hemoglobin: 9.8 g/dL — ABNORMAL LOW (ref 12.0–15.0)
Immature Granulocytes: 0 %
Lymphocytes Relative: 32 %
Lymphs Abs: 1.3 10*3/uL (ref 0.7–4.0)
MCH: 30.2 pg (ref 26.0–34.0)
MCHC: 31 g/dL (ref 30.0–36.0)
MCV: 97.2 fL (ref 80.0–100.0)
Monocytes Absolute: 0.4 10*3/uL (ref 0.1–1.0)
Monocytes Relative: 11 %
Neutro Abs: 2.1 10*3/uL (ref 1.7–7.7)
Neutrophils Relative %: 52 %
Platelet Count: 153 10*3/uL (ref 150–400)
RBC: 3.25 MIL/uL — ABNORMAL LOW (ref 3.87–5.11)
RDW: 13.7 % (ref 11.5–15.5)
WBC Count: 3.9 10*3/uL — ABNORMAL LOW (ref 4.0–10.5)
nRBC: 0 % (ref 0.0–0.2)

## 2020-03-22 LAB — SAMPLE TO BLOOD BANK

## 2020-03-22 LAB — CMP (CANCER CENTER ONLY)
ALT: 9 U/L (ref 0–44)
AST: 19 U/L (ref 15–41)
Albumin: 3.3 g/dL — ABNORMAL LOW (ref 3.5–5.0)
Alkaline Phosphatase: 60 U/L (ref 38–126)
Anion gap: 8 (ref 5–15)
BUN: 43 mg/dL — ABNORMAL HIGH (ref 8–23)
CO2: 23 mmol/L (ref 22–32)
Calcium: 8.9 mg/dL (ref 8.9–10.3)
Chloride: 111 mmol/L (ref 98–111)
Creatinine: 1.64 mg/dL — ABNORMAL HIGH (ref 0.44–1.00)
GFR, Estimated: 31 mL/min — ABNORMAL LOW (ref >60)
Glucose, Bld: 108 mg/dL — ABNORMAL HIGH (ref 70–99)
Potassium: 4.3 mmol/L (ref 3.5–5.1)
Sodium: 142 mmol/L (ref 135–145)
Total Bilirubin: 0.3 mg/dL (ref 0.3–1.2)
Total Protein: 7.1 g/dL (ref 6.5–8.1)

## 2020-03-22 LAB — MAGNESIUM: Magnesium: 2.4 mg/dL (ref 1.7–2.4)

## 2020-03-22 MED ORDER — HEPARIN SOD (PORK) LOCK FLUSH 100 UNIT/ML IV SOLN
500.0000 [IU] | Freq: Once | INTRAVENOUS | Status: AC
  Administered 2020-03-22: 500 [IU]
  Filled 2020-03-22: qty 5

## 2020-03-22 MED ORDER — SODIUM CHLORIDE 0.9% FLUSH
10.0000 mL | Freq: Once | INTRAVENOUS | Status: AC
  Administered 2020-03-22: 10 mL
  Filled 2020-03-22: qty 10

## 2020-03-26 ENCOUNTER — Telehealth: Payer: Self-pay | Admitting: Hematology

## 2020-03-26 ENCOUNTER — Telehealth: Payer: Self-pay

## 2020-03-26 ENCOUNTER — Other Ambulatory Visit: Payer: Self-pay | Admitting: *Deleted

## 2020-03-26 NOTE — Telephone Encounter (Signed)
Ms Neyens called stating she is willing to try chemotherapy again.  She prefers Wednesday am appointments

## 2020-03-26 NOTE — Telephone Encounter (Signed)
Scheduled appt per 1/11 sch msg - unable to reach pt- left message for patient with appt date and time

## 2020-03-26 NOTE — Patient Outreach (Signed)
[facility]) Care Management  03/26/2020  KATESHA EICHEL [birth year] [id number]  Telephone outreach for HF.  Weight is staying within 5#, yesterday 119#. Still has some SOB on exertion. Minimal edema, this is better since she got her support stockings.  She will resume chemotherapy. Will need careful follow ups to ensure she doesn't have HF exacerbation.  Note [facility] services by Vance Gather, RN, have been introduced but per chart review she has not had much luck with getting in touch with Mrs. Reller. Sent note to Almyra Free about [facility] involvment and request collaboration for pt well-being.  Goals Addressed            This Visit's Progress   . Patient Stated       Will participate in phone calls with [facility] care manager. 01/10/20 Reinforced the importance of our calls to report in her progress or challenges. 03/27/20 [name] has been happy and enthusiastic with her participation with [facility].      We agreed to talk again in one month.  [name]. [name], MSN, [facility] Gerontological Nurse Practitioner [facility] Care Management [phone]

## 2020-03-26 NOTE — Telephone Encounter (Signed)
Scheduled appts per 1/7 los. Pt confirmed appt date and time. Transferred pt to nurse for questions regarding the need for treatment.

## 2020-03-29 ENCOUNTER — Telehealth: Payer: Self-pay

## 2020-03-29 NOTE — Telephone Encounter (Signed)
200PM: Palliative care SW outreached patient to schedule in home visit.  SW left HIPPA complaint VM. Awaiting return call.  Will continue to offer palliative care support.

## 2020-03-29 NOTE — Progress Notes (Signed)
[place]   [phone] [phone]   Clinic Follow up Note   Patient Care Team: [name], MD as PCP - General (Family Medicine) [name], [name], MD as PCP - Cardiology (Cardiology) [name], [name], MD as Consulting Physician (Cardiology) [name], MD as Consulting Physician (Obstetrics and Gynecology) [name], [name], NP as Nurse Practitioner (Hematology and Oncology) [facility] [name], MD as Consulting Physician (Hematology) [name], [name], MD as Consulting Physician (Gastroenterology) Virgina Evener, Dawn, RN (Inactive) as Oncology Nurse Navigator [name], MD as Consulting Physician (General Surgery) [name], NP as [place] [facility]  Date of Service:  04/03/2020  CHIEF COMPLAINT: F/u ofcholangiocarcinomaof liver  SUMMARY OF ONCOLOGIC HISTORY: Oncology History Overview Note  Cancer Staging Intrahepatic cholangiocarcinoma ([place]) Staging form: Intrahepatic Bile Duct, AJCC 8th Edition - Clinical stage from 09/23/2018: Stage IB (cT1b, cN0, cM0) - Signed by [name], MD on 10/06/2018    Malignant neoplasm of female breast ([place])  11/06/2018 Genetic Testing   Negative genetic testing on the [facility] Common Hereditary Cancers panel. A variant of uncertain significance was identified in one of her APC genes, called c.1243G>A (p.Ala415Thr).  The Common Hereditary Cancers Panel offered by [facility] includes sequencing and/or deletion duplication testing of the following 48 genes: APC, ATM, AXIN2, BARD1, BMPR1A, BRCA1, BRCA2, BRIP1, CDH1, CDK4, CDKN2A (p14ARF), CDKN2A (p16INK4a), CHEK2, CTNNA1, DICER1, EPCAM (Deletion/duplication testing only), GREM1 (promoter region deletion/duplication testing only), KIT, MEN1, MLH1, MSH2, MSH3, MSH6, MUTYH, NBN, NF1, NHTL1, PALB2, PDGFRA, PMS2, POLD1, POLE, PTEN, RAD50, RAD51C, RAD51D, RNF43, SDHB, SDHC, SDHD, SMAD4, SMARCA4. STK11, TP53,  TSC1, TSC2, and VHL.  The following genes were evaluated for sequence changes only: SDHA and HOXB13 c.251G>A variant only.    Intrahepatic cholangiocarcinoma ([place])  09/07/2018 Imaging   CT Chest IMPRESSION: 1. New, enhancing mass involving segment 4 of the liver and fundus of gallbladder is concerning for malignancy. This may represent either metastatic disease from breast cancer or neoplasm primary to the liver or hepatic biliary tree. Further evaluation with contrast enhanced CT of the abdomen and pelvis is recommended. 2. No findings to suggest metastatic disease within the chest. 3.  Aortic Atherosclerosis (ICD10-I70.0). 4. Coronary artery calcifications.   09/13/2018 Pathology Results   Diagnosis Liver, needle/core biopsy - ADENOCARCINOMA. Microscopic Comment Immunohistochemistry for CK7 is positive. CK20, TTF1, CDX-2, GATA-3, PAX 8, Qualitative ER, p63 and CK5/6 are negative. The provided clinical history of remote mammary carcinoma is noted. Based on the morphology and immunophenotype of the adenocarcinoma observed in this specimen, primary cholangiocarcinoma is favored. Clinical and radiologic correlation are  encouraged. Results reported to [employer] on 09/15/2018. Intradepartmental consultation (Dr. Vic Ripper).   09/13/2018 Initial Diagnosis   Cholangiocarcinoma ([place])   09/23/2018 Procedure   Colonoscopy by Dr. Havery Moros 09/23/18  IMPRESSION - Two 3 to 4 mm polyps in the ascending colon, removed with a cold snare. Resected and retrieved. - Five 3 to 5 mm polyps in the transverse colon, removed with a cold snare. Resected and retrieved. - One 5 mm polyp at the splenic flexure, removed with a cold snare. Resected and retrieved. - Three 3 to 5 mm polyps in the sigmoid colon, removed with a cold snare. Resected and retrieved. - The examination was otherwise normal. Upper Endopscy by Dr. Havery Moros 09/23/18  IMPRESSION - Esophagogastric landmarks identified. - 2 cm hiatal  hernia. - Normal esophagus otherwise. - A single gastric polyp. Resected and retrieved. - Mild gastritis. Biopsied. - Normal duodenal bulb and  second portion of the duodenum.   09/23/2018 Pathology Results   Diagnosis 09/23/18 1. Surgical [P], duodenum - BENIGN SMALL BOWEL MUCOSA. - NO ACTIVE INFLAMMATION OR VILLOUS ATROPHY IDENTIFIED. 2. Surgical [P], stomach, polyp - HYPERPLASTIC POLYP(S). - THERE IS NO EVIDENCE OF MALIGNANCY. 3. Surgical [P], gastric antrum and gastric body - CHRONIC INACTIVE GASTRITIS. - THERE IS NO EVIDENCE OF HELICOBACTER-PYLORI, DYSPLASIA, OR MALIGNANCY. - SEE COMMENT. 4. Surgical [P], colon, sigmoid, splenic flexure, transverse and ascending, polyp (9) - TUBULAR ADENOMA(S). - SESSILE SERRATED POLYP WITHOUT CYTOLOGIC DYSPLASIA. - HIGH GRADE DYSPLASIA IS NOT IDENTIFIED. 5. Surgical [P], colon, sigmoid, polyp (2) - HYPERPLASTIC POLYP(S). - THERE IS NO EVIDENCE OF MALIGNANCY.   09/23/2018 Cancer Staging   Staging form: Intrahepatic Bile Duct, AJCC 8th Edition - Clinical stage from 09/23/2018: Stage IB (cT1b, cN0, cM0) - Signed by [name], MD on 10/06/2018   09/29/2018 PET scan   PET 09/29/18 IMPRESSION: 1. Hypermetabolic mass in the RIGHT hepatic lobe consistent with biopsy proven adenocarcinoma. No additional liver metastasis. 2. No evidence of local breast cancer recurrence in the RIGHT breast or RIGHT axilla. 3. Mild bilateral hypermetabolic adrenal glands is favored benign hyperplasia. 4. No evidence of additional metastatic disease on skull base to thigh FDG PET scan.   11/06/2018 Genetic Testing   Negative genetic testing on the [employer] Common Hereditary Cancers panel. A variant of uncertain significance was identified in one of her APC genes, called c.1243G>A (p.Ala415Thr).  The Common Hereditary Cancers Panel offered by [facility] includes sequencing and/or deletion duplication testing of the following 48 genes: APC, ATM, AXIN2, BARD1, BMPR1A, BRCA1,  BRCA2, BRIP1, CDH1, CDK4, CDKN2A (p14ARF), CDKN2A (p16INK4a), CHEK2, CTNNA1, DICER1, EPCAM (Deletion/duplication testing only), GREM1 (promoter region deletion/duplication testing only), KIT, MEN1, MLH1, MSH2, MSH3, MSH6, MUTYH, NBN, NF1, NHTL1, PALB2, PDGFRA, PMS2, POLD1, POLE, PTEN, RAD50, RAD51C, RAD51D, RNF43, SDHB, SDHC, SDHD, SMAD4, SMARCA4. STK11, TP53, TSC1, TSC2, and VHL.  The following genes were evaluated for sequence changes only: SDHA and HOXB13 c.251G>A variant only.    11/17/2018 Pathology Results   Diagnosis 11/17/18 1. Soft tissue, biopsy, Diaphragmatic nodules - METASTATIC ADENOCARCINOMA, CONSISTENT WITH PATIENT'S CLINICAL HISTORY OF CHOLANGIOCARCINOMA. SEE NOTE 2. Liver, biopsy, Left - LIVER PARENCHYMA WITH A BENIGN FIBROTIC NODULE - NO EVIDENCE OF MALIGNANCY 3. Stomach, biopsy - BENIGN PAPILLARY MESOTHELIAL HYPERPLASIA - NO EVIDENCE OF MALIGNANCY   11/29/2018 Imaging   CT CAP WO Contrast  IMPRESSION: 1. Dominant liver mass appears grossly stable from 09/29/2018. Additional liver lesions are too small to characterize but were not shown to be hypermetabolic on PET. 2. Mild nodularity of both adrenal glands with associated hypermetabolism on 09/29/2018. Continued attention on follow-up exams is warranted. 3. Small right lower lobe nodules, stable from 09/07/2018. Again, attention on follow-up is recommended. 4. Trace bilateral pleural fluid. 5. Aortic atherosclerosis (ICD10-170.0). Coronary artery calcification. 6. Enlarged pulmonic trunk, indicative of pulmonary arterial hypertension.     11/30/2018 - 06/14/2019 Chemotherapy   First line chemo Oxaliplatin and gemcitabine q2weeks starting 11/30/18. Stopped Oxaliplatin on 05/03/19 due to worsening neuropathy. Added Cisplatin with C13 on 05/17/19 and stopped on C15 06/14/19 due to neuropathy. Reduced to maintenance single agent Gemcitabine on 06/28/19.    01/20/2019 Imaging   CT CAP IMPRESSION: restaging  1. Mild interval  increase in size of dominant liver mass involving segment 4 and segment 5. 2. No significant or progressive adrenal nodularity identified to suggest metastatic disease. 3. Unchanged appearance of small right lower lobe and lingular lung nodules, nonspecific.  03/21/2019 PET scan   IMPRESSION: 1. Large right hepatic mass with SUV uptake near background hepatic activity, dramatic response to therapy, also with decrease in size when compared to the prior study. 2. Signs of prior right mastectomy and axillary dissection as before. 3. Left adrenal activity in no longer above the level of activity seen in the contralateral, right adrenal gland or in the liver. Attention on follow-up. 4. No new signs of disease.   06/27/2019 PET scan   IMPRESSION: 1. Further decrease in size of a dominant hepatic mass which is non FDG avid. 2. No evidence of metastatic disease. 3. No evidence of left adrenal hypermetabolism or mass. 4. Incidental findings, including: Uterine fibroids. Coronary artery atherosclerosis. Aortic Atherosclerosis (ICD10-I70.0). Pulmonary artery enlargement suggests pulmonary arterial hypertension.   06/28/2019 -  Chemotherapy   Maintenance single agent Gemcitabine q2weeks starting on 06/28/19 with C16.  ------On chemo break since 12/27/19.  ------She restarted Single Agent Gemcitabine q2weeks on 04/03/20 due to mild disease progression in liver on 02/2020 PET    09/15/2019 PET scan   IMPRESSION: 1. In the region of regional tumor at the junction of the right and left hepatic lobe, there is continued hypodensity and overall activity slightly less than that of the surrounding normal liver, indicating effective response to therapy. No current worrisome hypermetabolic lesion is identified in the liver or elsewhere. 2. Chronic asymmetric edema in the subcutaneous tissues of the right upper extremity, nonspecific. The patient has had a prior right mastectomy and right axillary  dissection. 3. Other imaging findings of potential clinical significance: Aortic Atherosclerosis (ICD10-I70.0). Coronary atherosclerosis. Mild cardiomegaly. Small right and trace left pleural effusions. Subcutaneous and mild mesenteric edema. Suspected uterine fibroids.   12/18/2019 PET scan   IMPRESSION: 1. No significant abnormal activity to suggest active/recurrent malignancy. 2. Lingual tonsillar activity is mildly increased from prior but probably incidental/physiologic, attention on follow up suggested. 3. Other imaging findings of potential clinical significance: Third spacing of fluid with trace ascites, mesenteric edema, subcutaneous edema, and possibly subtle pulmonary edema. Mild cardiomegaly. Aortic Atherosclerosis (ICD10-I70.0). Coronary atherosclerosis. Right ovarian dermoid. Calcified uterine fibroids. Renal cysts. Right glenohumeral arthropathy. Mild to moderate scoliosis. Suspected pulmonary arterial hypertension.   03/14/2020 PET scan   IMPRESSION: 1. New small focus of hypermetabolism in segment 4A of the liver worrisome for new metastatic focus. 2. No other findings for metastatic disease involving the neck, chest, abdomen or pelvis.        CURRENT THERAPY:  Maintenance single agent Gemcitabine q2weeks starting on 06/28/19 with C16.On chemo break since 12/27/19. She restarted Single Agent Gemcitabine q2weeks on 04/03/20 due to mild disease progression.   INTERVAL HISTORY:  Donna May is here for a follow up. She presents to the clinic alone. She notes her son dropped her off but not sure if her can pick her up given he has to work. She notes she is eating adequate. She notes her neuropathy is exacerbated with stiffness from the cold weather.    REVIEW OF SYSTEMS:   Constitutional: Denies fevers, chills or abnormal weight loss Eyes: Denies blurriness of vision Ears, nose, mouth, throat, and face: Denies mucositis or sore throat Respiratory: Denies cough,  dyspnea or wheezes Cardiovascular: Denies palpitation, chest discomfort or lower extremity swelling Gastrointestinal:  Denies nausea, heartburn or change in bowel habits Skin: Denies abnormal skin rashes Lymphatics: Denies new lymphadenopathy or easy bruising Neurological:Denies numbness, tingling or new weaknesses Behavioral/Psych: Mood is stable, no new changes  All other systems were reviewed  with the patient and are negative.  MEDICAL HISTORY:  Past Medical History:  Diagnosis Date  . Anemia   . Anxiety   . Arthritis   . Breast cancer ([place]) 1992  . Cataract    Bilateral eyes - surgery to remove  . Chronic diastolic CHF (congestive heart failure) ([place])   . Chronic kidney disease    CKD stage 3  . Complication of anesthesia    "something they use make me itch for a couple of days."  . Depression   . Diabetes mellitus    type 2  . Duodenitis 01/18/2002  . Fainting spell   . GERD (gastroesophageal reflux disease)   . Heart murmur    never has caused any problems  . Hiatal hernia 08/08/2008, 01/18/2002  . History of pneumonia    x 2  . Hyperlipidemia   . Hypertension   . Liver cancer ([place]) 08/2018  . Lymphedema 2017   Right arm    SURGICAL HISTORY: Past Surgical History:  Procedure Laterality Date  . AXILLARY SURGERY     cyst removal, right  . COLONOSCOPY  09/23/2018   Dr. Havery Moros - polyps  . EYE SURGERY Bilateral    cataracts to remove  . LAPAROSCOPY N/A 11/17/2018   Procedure: LAPAROSCOPY DIAGNOSTIC, INTRAOPERATIVE ULTRASOUND, PERITONEAL BIOPSIES;  Surgeon: [name], MD;  Location: [facility];  Service: General;  Laterality: N/A;  GENERAL AND EPIDURAL  . LIVER BIOPSY  08/2018   Dr. Lindwood Coke  . MASTECTOMY  1992   right, with flap  . NM MYOCAR PERF WALL MOTION  06/11/2009   Protocol:[place], post stress EF58%, EKG negative for ischemia, low risk  . PORTACATH PLACEMENT N/A 12/02/2018   Procedure: INSERTION PORT-A-CATH;  Surgeon: [name], MD;  Location: [facility];   Service: General;  Laterality: N/A;  . RECONSTRUCTION BREAST W/ TRAM FLAP Right   . TONSILLECTOMY    . TRANSTHORACIC ECHOCARDIOGRAM  12/24/2009   LVEF =>55%, normal study  . UPPER GI ENDOSCOPY      I have reviewed the social history and family history with the patient and they are unchanged from previous note.  ALLERGIES:  has No Known Allergies.  MEDICATIONS:  Current Outpatient Medications  Medication Sig Dispense Refill  . amLODipine (NORVASC) 10 MG tablet Take 1 tablet (10 mg total) by mouth daily. 90 tablet 3  . aspirin 81 MG tablet Take 81 mg by mouth daily.     . carvedilol (COREG) 25 MG tablet Take 1 tablet (25 mg total) by mouth 2 (two) times daily with a meal. 180 tablet 3  . furosemide (LASIX) 40 MG tablet Take 1 tablet (40 mg total) by mouth daily. 30 tablet 11  . glucose blood test strip 1 each by Other route 2 (two) times daily. Use Onetouch verio test strips as instructed to check blood sugar twice daily. 100 each 2  . hydrALAZINE (APRESOLINE) 100 MG tablet Take 1 tablet (100 mg total) by mouth every 8 (eight) hours. 90 tablet 3  . KLOR-CON M10 10 MEQ tablet TAKE 1 TABLET BY MOUTH EVERY DAY 90 tablet 1  . lansoprazole (PREVACID) 15 MG capsule Take 1 capsule (15 mg total) by mouth daily. 90 capsule 1  . loratadine (CLARITIN) 10 MG tablet Take 10 mg by mouth daily as needed for allergies.    . Omega 3 1000 MG CAPS Take 2,000 mg by mouth daily.     . rosuvastatin (CRESTOR) 20 MG tablet Take 1 tablet (20 mg total) by  mouth daily. 90 tablet 3  . sitaGLIPtin (JANUVIA) 100 MG tablet Take 1 tablet (100 mg total) by mouth daily. 90 tablet 4  . valsartan (DIOVAN) 320 MG tablet Take 1 tablet (320 mg total) by mouth daily. 90 tablet 3   Current Facility-Administered Medications  Medication Dose Route Frequency Provider Last Rate Last Admin  . triamcinolone acetonide (KENALOG) 10 MG/ML injection 10 mg  10 mg Other Once Harriet Masson, DPM        PHYSICAL EXAMINATION: ECOG  PERFORMANCE STATUS: 2 - Symptomatic, <50% confined to bed  Vitals:   04/03/20 0952  BP: (!) 151/57  Pulse: 63  Resp: 14  Temp: (!) 97 F (36.1 C)  SpO2: 100%   Filed Weights   04/03/20 0952  Weight: 123 lb 9.6 oz (56.1 kg)    Due to COVID19 we will limit examination to appearance. Patient had no complaints.  GENERAL:alert, no distress and comfortable SKIN: skin color normal, no rashes or significant lesions EYES: normal, Conjunctiva are pink and non-injected, sclera clear  NEURO: alert & oriented x 3 with fluent speech   LABORATORY DATA:  I have reviewed the data as listed CBC Latest Ref Rng & Units 04/03/2020 03/22/2020 02/29/2020  WBC 4.0 - 10.5 K/uL 4.8 3.9(L) 6.6  Hemoglobin 12.0 - 15.0 g/dL 9.2(L) 9.8(L) 9.9(L)  Hematocrit 36.0 - 46.0 % 28.7(L) 31.6(L) 30.9(L)  Platelets 150 - 400 K/uL 172 153 197     CMP Latest Ref Rng & Units 04/03/2020 03/22/2020 02/29/2020  Glucose 70 - 99 mg/dL 77 108(H) 96  BUN 8 - 23 mg/dL 47(H) 43(H) 59(H)  Creatinine 0.44 - 1.00 mg/dL 1.80(H) 1.64(H) 1.83(H)  Sodium 135 - 145 mmol/L 143 142 140  Potassium 3.5 - 5.1 mmol/L 4.4 4.3 4.7  Chloride 98 - 111 mmol/L 111 111 110  CO2 22 - 32 mmol/L _0 Calcium 8.9 - 10.3 mg/dL 8.8(L) 8.9 8.9  Total Protein 6.5 - 8.1 g/dL 7.0 7.1 7.6  Total Bilirubin 0.3 - 1.2 mg/dL <0.2(L) 0.3 0.3  Alkaline Phos 38 - 126 U/L 51 60 65  AST 15 - 41 U/L _1 ALT 0 - 44 U/L _2 RADIOGRAPHIC STUDIES: I have personally reviewed the radiological images as listed and agreed with the findings in the report. No results found.   ASSESSMENT & PLAN:  Donna May is a 80 y.o. female with   1.Intrahepatic cholangiocarcinoma, cT1N0M1,with peritoneal metastasis, MSS, IDH1 mutation (+) -She was diagnosed in 08/2018. Her biopsy of her liver mass shows adenocarcinoma,most consistent withcholangiocarcinoma.Her EGD/Colonoscopy from 7/10/20was negativeand 09/29/18 PET scanshowedno evidence of distant  metastasis. -She was brought to OR on 11/17/18,unfortunately she was found to have peritoneal metastasis to right diaphragm and surgery was aborted. -Given her metastatic cancer, Istarted her onsystemicchemo withFirst line chemoOxaliplatinand gemcitabine q2weeksto control her diseasebeginningon 11/30/18.Due to poor tolerance to Oxaliplatin and Cisplatin from neuropathy, she proceeded withagent Gemcitabineevery 2 weeksstarting 06/28/19 with C16.Dose was reduced to 637m/m2 since 09/20/19 due to anemia -She has been on chemo break since 12/2019 -Given PET from 03/14/20 showed New small focus 927mof hypermetabolism in segment 4A of the liver worrisome for new metastatic focus, I willrestart her on maintenance Gemcitabine q2 weeks today on 04/03/20.  -I personally reviewed the recent PET images with patient today. Labs reviewed and adequate to proceed with Gemcitabine today.  -f/u in 2 weeks.   2. Anemia,Secondary to chemoand cancer -Shepreviouslyrequiredblood transfusionsince 03/2019, continue as  needed. Will continue prenatal vitamin. -Labs blood transfusion10/25/21 -Hg has been stable in 9 range lately.   3.Peripheral neuropathy, G3 -secondary to Oxaliplatinand Cisplatin (both d/c) -Due to low tolerance and not much help, she stopped Lyrica,Gabapentinand Cymbalta.She can use Gabapentin 120m as needed at night for tingling.  -She waspreviouslyseen by Dr VMickeal SkinnerShe is now being seen byChiropractor Dr EPage Spiro -She remains to have decreased function overall.Stablewith occasional flares. -Will monitor with restart of Gemcitabine.   4. H/o right breast cancer, Geneticsnegative, Osteopenia -s/prightmastectomy in 1992, per patient did not require adjuvant therapy. She was last seen by Dr. MJana Hakimin 2017. -Her4/2019 DEXA shows mild osteopenia. F/u with Gyn about continuing Raloxifene. I do not think she has to continue at this  point.  5.Comorbidities:DM,HTN,hiatal hernia, GERD, renal artery stenosis, recent pulmonary edema -Continue medications and f/u with PCP and cardiologist for management. -Due to her uncontrolled hypertension, hydralazine was added,but it is difficult for her to take 3 times a day, she usually takes twice a day -She is on torsemide twice a week, and hydrochlorothiazide daily  6. Family support, Goal of Care Discussion -She has a son and daughterwho are often busy but help as needed. She notes they are aware of her condition. -The patient understands the goal of care is palliative.She is full code now  7.CKD stage III -Continue to monitor.Stable Cr at 1.2-15.  PLAN: -Labs reviewed and adequate to proceed with restart of Gemcitabine today  -Lab, Flush, F/u and Gemcitabine in 2 (04/17/20), 4, 6 weeks.    No problem-specific Assessment & Plan notes found for this encounter.   No orders of the defined types were placed in this encounter.  All questions were answered. The patient knows to call the clinic with any problems, questions or concerns. No barriers to learning was detected. The total time spent in the appointment was 30 minutes.     YTruitt Merle MD 04/03/2020   I, AJoslyn Devon am acting as scribe for YTruitt Merle MD.   I have reviewed the above documentation for accuracy and completeness, and I agree with the above.

## 2020-04-03 ENCOUNTER — Inpatient Hospital Stay (HOSPITAL_BASED_OUTPATIENT_CLINIC_OR_DEPARTMENT_OTHER): Payer: Medicare Other | Admitting: Hematology

## 2020-04-03 ENCOUNTER — Encounter: Payer: Self-pay | Admitting: Hematology

## 2020-04-03 ENCOUNTER — Inpatient Hospital Stay: Payer: Medicare Other

## 2020-04-03 ENCOUNTER — Other Ambulatory Visit: Payer: Self-pay

## 2020-04-03 VITALS — BP 151/57 | HR 63 | Temp 97.0°F | Resp 14 | Ht 65.0 in | Wt 123.6 lb

## 2020-04-03 DIAGNOSIS — C221 Intrahepatic bile duct carcinoma: Secondary | ICD-10-CM

## 2020-04-03 DIAGNOSIS — T451X5A Adverse effect of antineoplastic and immunosuppressive drugs, initial encounter: Secondary | ICD-10-CM | POA: Diagnosis not present

## 2020-04-03 DIAGNOSIS — D649 Anemia, unspecified: Secondary | ICD-10-CM

## 2020-04-03 DIAGNOSIS — Z5111 Encounter for antineoplastic chemotherapy: Secondary | ICD-10-CM | POA: Diagnosis not present

## 2020-04-03 DIAGNOSIS — Z95828 Presence of other vascular implants and grafts: Secondary | ICD-10-CM

## 2020-04-03 DIAGNOSIS — C786 Secondary malignant neoplasm of retroperitoneum and peritoneum: Secondary | ICD-10-CM | POA: Diagnosis not present

## 2020-04-03 DIAGNOSIS — G620 Drug-induced polyneuropathy: Secondary | ICD-10-CM

## 2020-04-03 DIAGNOSIS — I129 Hypertensive chronic kidney disease with stage 1 through stage 4 chronic kidney disease, or unspecified chronic kidney disease: Secondary | ICD-10-CM | POA: Diagnosis not present

## 2020-04-03 DIAGNOSIS — D6481 Anemia due to antineoplastic chemotherapy: Secondary | ICD-10-CM | POA: Diagnosis not present

## 2020-04-03 DIAGNOSIS — E1122 Type 2 diabetes mellitus with diabetic chronic kidney disease: Secondary | ICD-10-CM | POA: Diagnosis not present

## 2020-04-03 LAB — CMP (CANCER CENTER ONLY)
ALT: 8 U/L (ref 0–44)
AST: 16 U/L (ref 15–41)
Albumin: 3.3 g/dL — ABNORMAL LOW (ref 3.5–5.0)
Alkaline Phosphatase: 51 U/L (ref 38–126)
Anion gap: 7 (ref 5–15)
BUN: 47 mg/dL — ABNORMAL HIGH (ref 8–23)
CO2: 25 mmol/L (ref 22–32)
Calcium: 8.8 mg/dL — ABNORMAL LOW (ref 8.9–10.3)
Chloride: 111 mmol/L (ref 98–111)
Creatinine: 1.8 mg/dL — ABNORMAL HIGH (ref 0.44–1.00)
GFR, Estimated: 28 mL/min — ABNORMAL LOW (ref >60)
Glucose, Bld: 77 mg/dL (ref 70–99)
Potassium: 4.4 mmol/L (ref 3.5–5.1)
Sodium: 143 mmol/L (ref 135–145)
Total Bilirubin: <0.2 mg/dL — ABNORMAL LOW (ref 0.3–1.2)
Total Protein: 7 g/dL (ref 6.5–8.1)

## 2020-04-03 LAB — CBC WITH DIFFERENTIAL (CANCER CENTER ONLY)
Abs Immature Granulocytes: 0.01 10*3/uL (ref 0.00–0.07)
Basophils Absolute: 0 10*3/uL (ref 0.0–0.1)
Basophils Relative: 1 %
Eosinophils Absolute: 0.2 10*3/uL (ref 0.0–0.5)
Eosinophils Relative: 3 %
HCT: 28.7 % — ABNORMAL LOW (ref 36.0–46.0)
Hemoglobin: 9.2 g/dL — ABNORMAL LOW (ref 12.0–15.0)
Immature Granulocytes: 0 %
Lymphocytes Relative: 33 %
Lymphs Abs: 1.6 10*3/uL (ref 0.7–4.0)
MCH: 30.8 pg (ref 26.0–34.0)
MCHC: 32.1 g/dL (ref 30.0–36.0)
MCV: 96 fL (ref 80.0–100.0)
Monocytes Absolute: 0.6 10*3/uL (ref 0.1–1.0)
Monocytes Relative: 13 %
Neutro Abs: 2.4 10*3/uL (ref 1.7–7.7)
Neutrophils Relative %: 50 %
Platelet Count: 172 10*3/uL (ref 150–400)
RBC: 2.99 MIL/uL — ABNORMAL LOW (ref 3.87–5.11)
RDW: 13.8 % (ref 11.5–15.5)
WBC Count: 4.8 10*3/uL (ref 4.0–10.5)
nRBC: 0 % (ref 0.0–0.2)

## 2020-04-03 LAB — SAMPLE TO BLOOD BANK

## 2020-04-03 LAB — MAGNESIUM: Magnesium: 2.4 mg/dL (ref 1.7–2.4)

## 2020-04-03 MED ORDER — SODIUM CHLORIDE 0.9 % IV SOLN
Freq: Once | INTRAVENOUS | Status: AC
  Filled 2020-04-03: qty 250

## 2020-04-03 MED ORDER — SODIUM CHLORIDE 0.9 % IV SOLN
600.0000 mg/m2 | Freq: Once | INTRAVENOUS | Status: AC
  Administered 2020-04-03: 988 mg via INTRAVENOUS
  Filled 2020-04-03: qty 25.98

## 2020-04-03 MED ORDER — PROCHLORPERAZINE MALEATE 10 MG PO TABS
10.0000 mg | ORAL_TABLET | Freq: Once | ORAL | Status: AC
  Administered 2020-04-03: 10 mg via ORAL

## 2020-04-03 MED ORDER — PROCHLORPERAZINE MALEATE 10 MG PO TABS
ORAL_TABLET | ORAL | Status: AC
  Filled 2020-04-03: qty 1

## 2020-04-03 MED ORDER — HEPARIN SOD (PORK) LOCK FLUSH 100 UNIT/ML IV SOLN
500.0000 [IU] | Freq: Once | INTRAVENOUS | Status: AC | PRN
  Administered 2020-04-03: 500 [IU]
  Filled 2020-04-03: qty 5

## 2020-04-03 MED ORDER — SODIUM CHLORIDE 0.9% FLUSH
10.0000 mL | INTRAVENOUS | Status: DC | PRN
Start: 2020-04-03 — End: 2020-04-03
  Administered 2020-04-03: 10 mL
  Filled 2020-04-03: qty 10

## 2020-04-03 MED ORDER — SODIUM CHLORIDE 0.9% FLUSH
10.0000 mL | Freq: Once | INTRAVENOUS | Status: AC
  Administered 2020-04-03: 10 mL
  Filled 2020-04-03: qty 10

## 2020-04-03 NOTE — Patient Instructions (Signed)
[facility] Discharge Instructions for Patients Receiving Chemotherapy  Today you received the following chemotherapy agents Gemcitabine (GEMZAR).  To help prevent nausea and vomiting after your treatment, we encourage you to take your nausea medication as prescribed.   If you develop nausea and vomiting that is not controlled by your nausea medication, call the clinic.   BELOW ARE SYMPTOMS THAT SHOULD BE REPORTED IMMEDIATELY:  *FEVER GREATER THAN 100.5 F  *CHILLS WITH OR WITHOUT FEVER  NAUSEA AND VOMITING THAT IS NOT CONTROLLED WITH YOUR NAUSEA MEDICATION  *UNUSUAL SHORTNESS OF BREATH  *UNUSUAL BRUISING OR BLEEDING  TENDERNESS IN MOUTH AND THROAT WITH OR WITHOUT PRESENCE OF ULCERS  *URINARY PROBLEMS  *BOWEL PROBLEMS  UNUSUAL RASH Items with * indicate a potential emergency and should be followed up as soon as possible.  Feel free to call the clinic should you have any questions or concerns. The clinic phone number is [phone].  Please show the CHEMO ALERT CARD at check-in to the Emergency Department and triage nurse.   

## 2020-04-03 NOTE — Progress Notes (Signed)
Okay to treat with creatinine level of 1.80 per Dr. Burr Medico

## 2020-04-04 ENCOUNTER — Telehealth: Payer: Self-pay

## 2020-04-04 ENCOUNTER — Telehealth: Payer: Self-pay | Admitting: Hematology

## 2020-04-04 NOTE — Telephone Encounter (Signed)
1144 am.  Phone call made to patient to complete a telephonic visit.  Patient states she is doing well overall.  She continues to monitor weights daily.  Weights are typically 119 lbs.  She notes sometimes it is lower at 117/118 lbs.  Patient is monitoring sodium intake and is using Mrs. Dash.  She states she did not previously use a lot of salt on her food but food choices may have included more sodium than she realized.  Patient reports slight puffiness to her lower extremities but this has improved.  She has support stockings but does not use this on a regular basis since her lower extremity edema has improved.  Patient was seen by oncology yesterday for chemo transfusion.  She states she is feeling fine today and does not display any symptoms from transfusion.  Patient states she is okay with PC continuing with monthly check-ins as well as [facility] support.  Advised patient that PC will continue to reach out on a monthly basis and can complete in person visits should patient desire this.  No other concerns voiced by patient at this time.  PC will follow up with patient again next month.

## 2020-04-04 NOTE — Telephone Encounter (Signed)
Scheduled appts per 1/19 los. Pt confirmed appt dates and times.

## 2020-04-08 ENCOUNTER — Telehealth: Payer: Self-pay | Admitting: Family Medicine

## 2020-04-08 ENCOUNTER — Other Ambulatory Visit: Payer: Self-pay

## 2020-04-08 MED ORDER — SITAGLIPTIN PHOSPHATE 100 MG PO TABS: 100.0000 mg | ORAL_TABLET | Freq: Every day | ORAL | 3 refills | Status: DC

## 2020-04-08 NOTE — Telephone Encounter (Signed)
Pt is calling in needing a refill on Rx sitagliptin (JANUVIA) 100 MG #90 stated that she only has a few (only 4 pills).  Pharm:  [facility].

## 2020-04-08 NOTE — Telephone Encounter (Signed)
Rx sent to pt pharmacy for refill as requested

## 2020-04-11 ENCOUNTER — Other Ambulatory Visit: Payer: Self-pay

## 2020-04-11 ENCOUNTER — Ambulatory Visit (INDEPENDENT_AMBULATORY_CARE_PROVIDER_SITE_OTHER): Payer: Medicare Other

## 2020-04-11 VITALS — BP 130/60 | HR 65 | Temp 98.3°F | Ht 65.0 in | Wt 107.2 lb

## 2020-04-11 DIAGNOSIS — Z0000 Encounter for general adult medical examination without abnormal findings: Secondary | ICD-10-CM | POA: Diagnosis not present

## 2020-04-11 NOTE — Progress Notes (Signed)
Subjective:   Donna May is a 80 y.o. female who presents for Medicare Annual (Subsequent) preventive examination.  Review of Systems    N/A  Cardiac Risk Factors include: advanced age (>81men, >11 women);diabetes mellitus;hypertension;dyslipidemia     Objective:    Today's Vitals   04/11/20 1047 04/11/20 1048  BP: 130/60   Pulse: 65   Temp: 98.3 F (36.8 C)   TempSrc: Oral   Weight: 107 lb 4 oz (48.6 kg)   Height: 5\' 5"  (1.651 m)   PainSc:  10-Worst pain ever   Body mass index is 17.85 kg/m.  Advanced Directives 04/11/2020 04/11/2020 04/03/2020 03/22/2020 01/18/2020 12/28/2019 12/27/2019  Does Patient Have a Medical Advance Directive? Yes Yes Yes Yes No Yes Yes  Type of Paramedic of [place];Living will [place];Living will - - - Living will;Healthcare Power of Attorney -  Does patient want to make changes to medical advance directive? No - Patient declined No - Patient declined - - No - Patient declined No - Patient declined No - Patient declined  Copy of [place] in Chart? Yes - validated most recent copy scanned in chart (See row information) No - copy requested - - - No - copy requested Yes - validated most recent copy scanned in chart (See row information)  Would patient like information on creating a medical advance directive? - - - - No - Patient declined - No - Patient declined    Current Medications (verified) Outpatient Encounter Medications as of 04/11/2020  Medication Sig  . amLODipine (NORVASC) 10 MG tablet Take 1 tablet (10 mg total) by mouth daily.  Marland Kitchen aspirin 81 MG tablet Take 81 mg by mouth daily.   . carvedilol (COREG) 25 MG tablet Take 1 tablet (25 mg total) by mouth 2 (two) times daily with a meal.  . furosemide (LASIX) 40 MG tablet Take 1 tablet (40 mg total) by mouth daily.  Marland Kitchen glucose blood test strip 1 each by Other route 2 (two) times daily. Use Onetouch verio test strips as instructed to  check blood sugar twice daily.  . hydrALAZINE (APRESOLINE) 100 MG tablet Take 1 tablet (100 mg total) by mouth every 8 (eight) hours.  Marland Kitchen KLOR-CON M10 10 MEQ tablet TAKE 1 TABLET BY MOUTH EVERY DAY  . lansoprazole (PREVACID) 15 MG capsule Take 1 capsule (15 mg total) by mouth daily.  Marland Kitchen loratadine (CLARITIN) 10 MG tablet Take 10 mg by mouth daily as needed for allergies.  . Omega 3 1000 MG CAPS Take 2,000 mg by mouth daily.   . rosuvastatin (CRESTOR) 20 MG tablet Take 1 tablet (20 mg total) by mouth daily.  . sitaGLIPtin (JANUVIA) 100 MG tablet Take 1 tablet (100 mg total) by mouth daily.  . valsartan (DIOVAN) 320 MG tablet Take 1 tablet (320 mg total) by mouth daily.   Facility-Administered Encounter Medications as of 04/11/2020  Medication  . triamcinolone acetonide (KENALOG) 10 MG/ML injection 10 mg    Allergies (verified) Patient has no known allergies.   History: Past Medical History:  Diagnosis Date  . Anemia   . Anxiety   . Arthritis   . Breast cancer ([place]) 1992  . Cataract    Bilateral eyes - surgery to remove  . Chronic diastolic CHF (congestive heart failure) ([place])   . Chronic kidney disease    CKD stage 3  . Complication of anesthesia    "something they use make me itch for a couple  of days."  . Depression   . Diabetes mellitus    type 2  . Duodenitis 01/18/2002  . Fainting spell   . GERD (gastroesophageal reflux disease)   . Heart murmur    never has caused any problems  . Hiatal hernia 08/08/2008, 01/18/2002  . History of pneumonia    x 2  . Hyperlipidemia   . Hypertension   . Liver cancer ([place]) 08/2018  . Lymphedema 2017   Right arm   Past Surgical History:  Procedure Laterality Date  . AXILLARY SURGERY     cyst removal, right  . COLONOSCOPY  09/23/2018   Dr. Havery Moros - polyps  . EYE SURGERY Bilateral    cataracts to remove  . LAPAROSCOPY N/A 11/17/2018   Procedure: LAPAROSCOPY DIAGNOSTIC, INTRAOPERATIVE ULTRASOUND, PERITONEAL BIOPSIES;  Surgeon:  [name], MD;  Location: [facility];  Service: General;  Laterality: N/A;  GENERAL AND EPIDURAL  . LIVER BIOPSY  08/2018   Dr. Lindwood Coke  . MASTECTOMY  1992   right, with flap  . NM MYOCAR PERF WALL MOTION  06/11/2009   Protocol:[place], post stress EF58%, EKG negative for ischemia, low risk  . PORTACATH PLACEMENT N/A 12/02/2018   Procedure: INSERTION PORT-A-CATH;  Surgeon: [name], MD;  Location: [facility];  Service: General;  Laterality: N/A;  . RECONSTRUCTION BREAST W/ TRAM FLAP Right   . TONSILLECTOMY    . TRANSTHORACIC ECHOCARDIOGRAM  12/24/2009   LVEF =>55%, normal study  . UPPER GI ENDOSCOPY     Family History  Problem Relation Age of Onset  . Breast cancer Cousin        diagnosed >50; mother's first cousins  . Diabetes Brother   . Heart disease Mother   . Hypertension Mother   . Heart disease Father   . Hypertension Father   . Prostate cancer Brother 43  . Heart attack Maternal Grandmother   . Stroke Maternal Grandfather   . Colon cancer Neg Hx   . Esophageal cancer Neg Hx   . Stomach cancer Neg Hx   . Rectal cancer Neg Hx    Social History   Socioeconomic History  . Marital status: Single    Spouse name: Not on file  . Number of children: 2  . Years of education: Not on file  . Highest education level: Not on file  Occupational History  . Occupation: retired  Tobacco Use  . Smoking status: Former Smoker    Packs/day: 0.25    Years: 25.00    Pack years: 6.25    Types: Cigarettes    Quit date: 1990    Years since quitting: 32.0  . Smokeless tobacco: Never Used  Vaping Use  . Vaping Use: Never used  Substance and Sexual Activity  . Alcohol use: Not Currently    Alcohol/week: 1.0 standard drink    Types: 1 Glasses of wine per week    Comment: occasional wine  . Drug use: No  . Sexual activity: Yes    Partners: Male    Birth control/protection: Condom, Post-menopausal    Comment: First sexual encounter age 51. Fewer than 5 partners in life time.  Other  Topics Concern  . 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  Social Determinants of Health   Financial Resource Strain: Low Risk   . Difficulty of Paying Living Expenses: Not hard at all  Food Insecurity: No Food Insecurity  . Worried About Charity fundraiser in the Last Year: Never true  . Ran Out of Food in the Last Year: Never true  Transportation Needs: No Transportation Needs  . Lack of Transportation (Medical): No  . Lack of Transportation (Non-Medical): No  Physical Activity: Inactive  . Days of Exercise per Week: 0 days  . Minutes of Exercise per Session: 0 min  Stress: No Stress Concern Present  . Feeling of Stress : Not at all  Social Connections: Moderately Isolated  . Frequency of Communication with Friends and Family: More than three times a week  . Frequency of Social Gatherings with Friends and Family: More than three times a week  . Attends Religious Services: More than 4 times per year  . Active Member of Clubs or Organizations: No  . Attends Archivist Meetings: Never  . Marital Status: Widowed    Tobacco Counseling Counseling given: Not Answered   Clinical Intake:  Pre-visit preparation completed: Yes  Pain : 0-10 Pain Score: 10-Worst pain ever Pain Type: Neuropathic pain Pain Location:  (hands, feet) Pain Orientation: Right,Left Pain Descriptors / Indicators: Pins and needles Pain Onset: More than a month ago Pain Frequency: Constant Pain Relieving Factors: Gabapentin  Pain Relieving Factors: Gabapentin  Nutritional Risks: None Diabetes: Yes CBG done?: No Did pt. bring in CBG monitor from home?: No  How often do you need to have someone help you when you read instructions, pamphlets, or other written  materials from your doctor or pharmacy?: 1 - Never  Diabetic? Yes Nutrition Risk Assessment:  Has the patient had any N/V/D within the last 2 months?  No  Does the patient have any non-healing wounds?  No  Has the patient had any unintentional weight loss or weight gain?  No   Diabetes:  Is the patient diabetic?  Yes  If diabetic, was a CBG obtained today?  No  Did the patient bring in their glucometer from home?  No  How often do you monitor your CBG's? Patient states does not check glucose at home.   Financial Strains and Diabetes Management:  Are you having any financial strains with the device, your supplies or your medication? No .  Does the patient want to be seen by Chronic Care Management for management of their diabetes?  No  Would the patient like to be referred to a Nutritionist or for Diabetic Management?  No   Diabetic Exams:  Diabetic Eye Exam: Overdue for diabetic eye exam. Pt has been advised about the importance in completing this exam. Patient advised to call and schedule an eye exam. Diabetic Foot Exam: Overdue, Pt has been advised about the importance in completing this exam. Pt is scheduled for diabetic foot exam on at next scheduled appointment .   Interpreter Needed?: No  Information entered by :: [place] of Daily Living In your present state of health, do you have any difficulty performing the following activities: 04/11/2020 12/28/2019  Hearing? Y N  Vision? N N  Difficulty concentrating or making decisions? N N  Walking or climbing stairs? N N  Dressing or bathing? N Y  Doing errands, shopping? N N  Preparing Food and eating ? N -  Using the Toilet? N -  In the past six months, have you accidently leaked urine?  Y -  Do you have problems with loss of bowel control? Y -  Managing your Medications? N -  Managing your Finances? N -  Housekeeping or managing your Housekeeping? N -  Some recent data might be hidden    Patient Care  Team: [name], MD as PCP - General (Family Medicine) [name], [name], MD as PCP - Cardiology (Cardiology) [name], [name], MD as Consulting Physician (Cardiology) [name], MD as Consulting Physician (Obstetrics and Gynecology) [name], [name], NP as Nurse Practitioner (Hematology and Oncology) [facility] [name], MD as Consulting Physician (Hematology) [name], [name], MD as Consulting Physician (Gastroenterology) Virgina Evener, Dawn, RN (Inactive) as Oncology Nurse Navigator [name], MD as Consulting Physician (General Surgery) [name], NP as [place] any recent [place] you may have received from other than [facility] providers in the past year (date may be approximate).     Assessment:   This is a routine wellness examination for Clare.  Hearing/Vision screen  Hearing Screening   125Hz  250Hz  500Hz  1000Hz  2000Hz  3000Hz  4000Hz  6000Hz  8000Hz   Right ear:           Left ear:           Vision Screening Comments: Patient states gets eyes examined once per year. Has bilateral cataracts   Dietary issues and exercise activities discussed: Current Exercise Habits: The patient does not participate in regular exercise at present  Goals    . patient     Take a trip; slow down work Can take a trip; bus trip; Sr Citizen trip [employer] and gets vacation trips planner      . Patient Stated     Will quit working!    . Patient Stated     When you feeling down;  Go shopping and not buy anything. Visit the [facility] center;  [place] a trip; give them a call     . Patient Stated     Retire fully!     . Patient Stated     Will participate in phone calls with [facility] care manager. 01/10/20 Reinforced the importance of our calls to report in her progress or challenges. 03/27/20 [name] has been happy and enthusiastic with her participation with [place] [facility].    . Track and Manage Symptoms     Follow Up Date 03/30/20  - begin a heart failure diary - bring diary to all appointments - develop a rescue plan - eat more whole grains, fruits and vegetables, lean meats and healthy fats - follow rescue plan if symptoms flare-up - know when to call the doctor - track symptoms and what helps feel better or worse    Why is this important?   You will be able to handle your symptoms better if you keep track of them.  Making some simple changes to your lifestyle will help.  Eating healthy is one thing you can do to take good care of yourself.    Notes: Has Disease management calendar and has started recording her wt. She is familiarizing herself with the HF action plan. 01/24/20 Had another hospitalization for HF, today first discharge conversation. Reviewed HF management principles, diet and need for primary care appt along with scheduled cardiology and oncology appts. Need to work on recognizing sxs and seeking help before needing to go to the hospital. 02/22/20 Pt seems to be at dry wt 117-120#, No SOB nor  edema. Creat 1.7, GFR 30. Stockings have been ordered and should arrive this week. Reinforced pt to call NP or cardiologist with increasing sxs!      Depression Screen PHQ 2/9 Scores 04/11/2020 11/09/2019 09/14/2018 05/16/2018 05/04/2017 04/26/2017 04/20/2016  PHQ - 2 Score 0 0 6 - 0 0 1  PHQ- 9 Score - - 9 - - - -  Exception Documentation - - - Other- indicate reason in comment box - - -  Not completed - - - pt was more interested in talking about her diabetic routine care, and concern with needed labwork.  - - -    Fall Risk Fall Risk  04/11/2020 11/10/2019 09/14/2018 05/16/2018 05/04/2017  Falls in the past year? 1 0 0 0 Yes  Comment - - - - bent over to pick up the paper and slipped and fell out of the car   Number falls in past yr: 1 0 0 - 1  Injury with Fall? 0 0 0 - -  Risk for fall due to : Impaired balance/gait;Medication side  effect;History of fall(s);Impaired mobility Impaired balance/gait;Other (Comment) - Impaired mobility;Impaired vision;History of fall(s) -  Risk for fall due to: Comment - Neuropathy - - -  Follow up Falls evaluation completed;Falls prevention discussed Falls evaluation completed Falls evaluation completed Education provided Education provided    [place]:  Any stairs in or around the home? Yes  If so, are there any without handrails? No  Home free of loose throw rugs in walkways, pet beds, electrical cords, etc? Yes  Adequate lighting in your home to reduce risk of falls? Yes   ASSISTIVE DEVICES UTILIZED TO PREVENT FALLS:  Life alert? No  Use of a cane, walker or w/c? Yes  Grab bars in the bathroom? No  Shower chair or bench in shower? No  Elevated toilet seat or a handicapped toilet? No   TIMED UP AND GO:  Was the test performed? Yes .  Length of time to ambulate 10 feet: 6 sec.   Gait slow and steady with assistive device  Cognitive Function: Normal cognitive status assessed by direct observation by this Nurse Health Advisor. No abnormalities found.   MMSE - Mini Mental State Exam 05/04/2017  Not completed: (No Data)        Immunizations Immunization History  Administered Date(s) Administered  . Fluad Quad(high Dose 65+) 12/05/2018, 12/20/2019  . Influenza, High Dose Seasonal PF 01/12/2014, 02/04/2015, 02/11/2017  . Influenza,inj,Quad PF,6+ Mos 12/27/2015  . Influenza-Unspecified 12/14/2012  . Moderna Sars-Covid-2 Vaccination 04/07/2019, 05/12/2019  . Pneumococcal Conjugate-13 01/12/2014  . Pneumococcal Polysaccharide-23 02/04/2015  . Zoster 05/02/2012    TDAP status: Due, Education has been provided regarding the importance of this vaccine. Advised may receive this vaccine at local pharmacy or Health Dept. Aware to provide a copy of the vaccination record if obtained from local pharmacy or Health Dept. Verbalized acceptance and  understanding.  Flu Vaccine status: Up to date  Pneumococcal vaccine status: Up to date  Covid-19 vaccine status: Completed vaccines  Qualifies for Shingles Vaccine? Yes   Zostavax completed Yes   Shingrix Completed?: No.    Education has been provided regarding the importance of this vaccine. Patient has been advised to call insurance company to determine out of pocket expense if they have not yet received this vaccine. Advised may also receive vaccine at local pharmacy or Health Dept. Verbalized acceptance and understanding.  Screening Tests Health Maintenance  Topic Date Due  . TETANUS/TDAP  Never done  . MAMMOGRAM  12/08/2018  . COVID-19 Vaccine (3 - Moderna risk 4-dose series) 06/09/2019  . FOOT EXAM  09/01/2019  . OPHTHALMOLOGY EXAM  09/15/2019  . HEMOGLOBIN A1C  06/27/2020  . INFLUENZA VACCINE  Completed  . DEXA SCAN  Completed  . PNA vac Low Risk Adult  Completed    Health Maintenance  Health Maintenance Due  Topic Date Due  . TETANUS/TDAP  Never done  . MAMMOGRAM  12/08/2018  . COVID-19 Vaccine (3 - Moderna risk 4-dose series) 06/09/2019  . FOOT EXAM  09/01/2019  . OPHTHALMOLOGY EXAM  09/15/2019    Colorectal cancer screening: No longer required.   Mammogram status: No longer required due to age.  Bone Density status: Completed 07/13/2017. Results reflect: Bone density results: OSTEOPENIA. Repeat every 5 years.  Lung Cancer Screening: (Low Dose CT Chest recommended if Age 35-80 years, 30 pack-year currently smoking OR have quit w/in 15years.) does not qualify.   Lung Cancer Screening Referral: N/A   Additional Screening:  Hepatitis C Screening: does not qualify:  Vision Screening: Recommended annual ophthalmology exams for early detection of glaucoma and other disorders of the eye. Is the patient up to date with their annual eye exam?  Yes  Who is the provider or what is the name of the office in which the patient attends annual eye exams? Dr.Groat  If  pt is not established with a provider, would they like to be referred to a provider to establish care? No .   Dental Screening: Recommended annual dental exams for proper oral hygiene  Community Resource Referral / Chronic Care Management: CRR required this visit?  No   CCM required this visit?  No      Plan:     I have personally reviewed and noted the following in the patient's chart:   . Medical and social history . Use of alcohol, tobacco or illicit drugs  . Current medications and supplements . Functional ability and status . Nutritional status . Physical activity . Advanced directives . List of other physicians . Hospitalizations, surgeries, and ER visits in previous 12 months . Vitals . Screenings to include cognitive, depression, and falls . Referrals and appointments  In addition, I have reviewed and discussed with patient certain preventive protocols, quality metrics, and best practice recommendations. A written personalized care plan for preventive services as well as general preventive health recommendations were provided to patient.     Ofilia Neas, LPN   [id number]   Nurse Notes: None

## 2020-04-11 NOTE — Patient Instructions (Signed)
[name] , Thank you for taking time to come for your Medicare Wellness Visit. I appreciate your ongoing commitment to your health goals. Please review the following plan we discussed and let me know if I can assist you in the future.   Screening recommendations/referrals: Colonoscopy: No longer required  Mammogram: No longer required Bone Density: Up to date, next due 07/14/2022 Recommended yearly ophthalmology/optometry visit for glaucoma screening and checkup Recommended yearly dental visit for hygiene and checkup  Vaccinations: Influenza vaccine: Up to date, next due fall 80  Pneumococcal vaccine: Completed series  Tdap vaccine: Currently due, you may await and injury to receive Shingles vaccine: Currently due, you may receive in our office or at your local pharmacy     Advanced directives: Copies on file   Conditions/risks identified: None   Next appointment: None    Preventive Care 65 Years and Older, Female Preventive care refers to lifestyle choices and visits with your health care provider that can promote health and wellness. What does preventive care include?  A yearly physical exam. This is also called an annual well check.  Dental exams once or twice a year.  Routine eye exams. Ask your health care provider how often you should have your eyes checked.  Personal lifestyle choices, including:  Daily care of your teeth and gums.  Regular physical activity.  Eating a healthy diet.  Avoiding tobacco and drug use.  Limiting alcohol use.  Practicing safe sex.  Taking low-dose aspirin every day.  Taking vitamin and mineral supplements as recommended by your health care provider. What happens during an annual well check? The services and screenings done by your health care provider during your annual well check will depend on your age, overall health, lifestyle risk factors, and family history of disease. Counseling  Your health care provider may ask you  questions about your:  Alcohol use.  Tobacco use.  Drug use.  Emotional well-being.  Home and relationship well-being.  Sexual activity.  Eating habits.  History of falls.  Memory and ability to understand (cognition).  Work and work Statistician.  Reproductive health. Screening  You may have the following tests or measurements:  Height, weight, and BMI.  Blood pressure.  Lipid and cholesterol levels. These may be checked every 5 years, or more frequently if you are over 82 years old.  Skin check.  Lung cancer screening. You may have this screening every year starting at age 82 if you have a 30-pack-year history of smoking and currently smoke or have quit within the past 15 years.  Fecal occult blood test (FOBT) of the stool. You may have this test every year starting at age 82.  Flexible sigmoidoscopy or colonoscopy. You may have a sigmoidoscopy every 5 years or a colonoscopy every 10 years starting at age 82.  Hepatitis C blood test.  Hepatitis B blood test.  Sexually transmitted disease (STD) testing.  Diabetes screening. This is done by checking your blood sugar (glucose) after you have not eaten for a while (fasting). You may have this done every 1-3 years.  Bone density scan. This is done to screen for osteoporosis. You may have this done starting at age 82.  Mammogram. This may be done every 1-2 years. Talk to your health care provider about how often you should have regular mammograms. Talk with your health care provider about your test results, treatment options, and if necessary, the need for more tests. Vaccines  Your health care provider may recommend certain vaccines,  such as:  Influenza vaccine. This is recommended every year.  Tetanus, diphtheria, and acellular pertussis (Tdap, Td) vaccine. You may need a Td booster every 10 years.  Zoster vaccine. You may need this after age 82.  Pneumococcal 13-valent conjugate (PCV13) vaccine. One dose is  recommended after age 82.  Pneumococcal polysaccharide (PPSV23) vaccine. One dose is recommended after age 82. Talk to your health care provider about which screenings and vaccines you need and how often you need them. This information is not intended to replace advice given to you by your health care provider. Make sure you discuss any questions you have with your health care provider. Document Released: 03/29/2015 Document Revised: 11/20/2015 Document Reviewed: 01/01/2015 Elsevier Interactive Patient Education  2017 [place] Prevention in the Home Falls can cause injuries. They can happen to people of all ages. There are many things you can do to make your home safe and to help prevent falls. What can I do on the outside of my home?  Regularly fix the edges of walkways and driveways and fix any cracks.  Remove anything that might make you trip as you walk through a door, such as a raised step or threshold.  Trim any bushes or trees on the path to your home.  Use bright outdoor lighting.  Clear any walking paths of anything that might make someone trip, such as rocks or tools.  Regularly check to see if handrails are loose or broken. Make sure that both sides of any steps have handrails.  Any raised decks and porches should have guardrails on the edges.  Have any leaves, snow, or ice cleared regularly.  Use sand or salt on walking paths during winter.  Clean up any spills in your garage right away. This includes oil or grease spills. What can I do in the bathroom?  Use night lights.  Install grab bars by the toilet and in the tub and shower. Do not use towel bars as grab bars.  Use non-skid mats or decals in the tub or shower.  If you need to sit down in the shower, use a plastic, non-slip stool.  Keep the floor dry. Clean up any water that spills on the floor as soon as it happens.  Remove soap buildup in the tub or shower regularly.  Attach bath mats  securely with double-sided non-slip rug tape.  Do not have throw rugs and other things on the floor that can make you trip. What can I do in the bedroom?  Use night lights.  Make sure that you have a light by your bed that is easy to reach.  Do not use any sheets or blankets that are too big for your bed. They should not hang down onto the floor.  Have a firm chair that has side arms. You can use this for support while you get dressed.  Do not have throw rugs and other things on the floor that can make you trip. What can I do in the kitchen?  Clean up any spills right away.  Avoid walking on wet floors.  Keep items that you use a lot in easy-to-reach places.  If you need to reach something above you, use a strong step stool that has a grab bar.  Keep electrical cords out of the way.  Do not use floor polish or wax that makes floors slippery. If you must use wax, use non-skid floor wax.  Do not have throw rugs and other things on  the floor that can make you trip. What can I do with my stairs?  Do not leave any items on the stairs.  Make sure that there are handrails on both sides of the stairs and use them. Fix handrails that are broken or loose. Make sure that handrails are as long as the stairways.  Check any carpeting to make sure that it is firmly attached to the stairs. Fix any carpet that is loose or worn.  Avoid having throw rugs at the top or bottom of the stairs. If you do have throw rugs, attach them to the floor with carpet tape.  Make sure that you have a light switch at the top of the stairs and the bottom of the stairs. If you do not have them, ask someone to add them for you. What else can I do to help prevent falls?  Wear shoes that:  Do not have high heels.  Have rubber bottoms.  Are comfortable and fit you well.  Are closed at the toe. Do not wear sandals.  If you use a stepladder:  Make sure that it is fully opened. Do not climb a closed  stepladder.  Make sure that both sides of the stepladder are locked into place.  Ask someone to hold it for you, if possible.  Clearly mark and make sure that you can see:  Any grab bars or handrails.  First and last steps.  Where the edge of each step is.  Use tools that help you move around (mobility aids) if they are needed. These include:  Canes.  Walkers.  Scooters.  Crutches.  Turn on the lights when you go into a dark area. Replace any light bulbs as soon as they burn out.  Set up your furniture so you have a clear path. Avoid moving your furniture around.  If any of your floors are uneven, fix them.  If there are any pets around you, be aware of where they are.  Review your medicines with your doctor. Some medicines can make you feel dizzy. This can increase your chance of falling. Ask your doctor what other things that you can do to help prevent falls. This information is not intended to replace advice given to you by your health care provider. Make sure you discuss any questions you have with your health care provider. Document Released: 12/27/2008 Document Revised: 08/08/2015 Document Reviewed: 04/06/2014 Elsevier Interactive Patient Education  2017 [employer].

## 2020-04-17 ENCOUNTER — Other Ambulatory Visit: Payer: Self-pay

## 2020-04-17 ENCOUNTER — Inpatient Hospital Stay: Payer: Medicare Other

## 2020-04-17 ENCOUNTER — Inpatient Hospital Stay: Payer: Medicare Other | Attending: Adult Health

## 2020-04-17 ENCOUNTER — Inpatient Hospital Stay (HOSPITAL_BASED_OUTPATIENT_CLINIC_OR_DEPARTMENT_OTHER): Payer: Medicare Other | Admitting: Medical

## 2020-04-17 VITALS — BP 154/60 | HR 59 | Temp 98.1°F | Resp 17 | Wt 120.0 lb

## 2020-04-17 DIAGNOSIS — C221 Intrahepatic bile duct carcinoma: Secondary | ICD-10-CM

## 2020-04-17 DIAGNOSIS — Z95828 Presence of other vascular implants and grafts: Secondary | ICD-10-CM

## 2020-04-17 DIAGNOSIS — D649 Anemia, unspecified: Secondary | ICD-10-CM

## 2020-04-17 DIAGNOSIS — C786 Secondary malignant neoplasm of retroperitoneum and peritoneum: Secondary | ICD-10-CM | POA: Insufficient documentation

## 2020-04-17 DIAGNOSIS — Z5111 Encounter for antineoplastic chemotherapy: Secondary | ICD-10-CM | POA: Diagnosis not present

## 2020-04-17 LAB — CBC WITH DIFFERENTIAL (CANCER CENTER ONLY)
Abs Immature Granulocytes: 0.01 10*3/uL (ref 0.00–0.07)
Basophils Absolute: 0 10*3/uL (ref 0.0–0.1)
Basophils Relative: 0 %
Eosinophils Absolute: 0.1 10*3/uL (ref 0.0–0.5)
Eosinophils Relative: 2 %
HCT: 29 % — ABNORMAL LOW (ref 36.0–46.0)
Hemoglobin: 9.2 g/dL — ABNORMAL LOW (ref 12.0–15.0)
Immature Granulocytes: 0 %
Lymphocytes Relative: 37 %
Lymphs Abs: 1.9 10*3/uL (ref 0.7–4.0)
MCH: 30.9 pg (ref 26.0–34.0)
MCHC: 31.7 g/dL (ref 30.0–36.0)
MCV: 97.3 fL (ref 80.0–100.0)
Monocytes Absolute: 0.6 10*3/uL (ref 0.1–1.0)
Monocytes Relative: 12 %
Neutro Abs: 2.5 10*3/uL (ref 1.7–7.7)
Neutrophils Relative %: 49 %
Platelet Count: 204 10*3/uL (ref 150–400)
RBC: 2.98 MIL/uL — ABNORMAL LOW (ref 3.87–5.11)
RDW: 14.5 % (ref 11.5–15.5)
WBC Count: 5.2 10*3/uL (ref 4.0–10.5)
nRBC: 0 % (ref 0.0–0.2)

## 2020-04-17 LAB — CMP (CANCER CENTER ONLY)
ALT: 10 U/L (ref 0–44)
AST: 17 U/L (ref 15–41)
Albumin: 3.4 g/dL — ABNORMAL LOW (ref 3.5–5.0)
Alkaline Phosphatase: 54 U/L (ref 38–126)
Anion gap: 6 (ref 5–15)
BUN: 50 mg/dL — ABNORMAL HIGH (ref 8–23)
CO2: 28 mmol/L (ref 22–32)
Calcium: 8.8 mg/dL — ABNORMAL LOW (ref 8.9–10.3)
Chloride: 108 mmol/L (ref 98–111)
Creatinine: 1.73 mg/dL — ABNORMAL HIGH (ref 0.44–1.00)
GFR, Estimated: 29 mL/min — ABNORMAL LOW (ref >60)
Glucose, Bld: 118 mg/dL — ABNORMAL HIGH (ref 70–99)
Potassium: 4.6 mmol/L (ref 3.5–5.1)
Sodium: 142 mmol/L (ref 135–145)
Total Bilirubin: 0.3 mg/dL (ref 0.3–1.2)
Total Protein: 7.1 g/dL (ref 6.5–8.1)

## 2020-04-17 LAB — SAMPLE TO BLOOD BANK

## 2020-04-17 MED ORDER — SODIUM CHLORIDE 0.9% FLUSH
10.0000 mL | INTRAVENOUS | Status: DC | PRN
  Administered 2020-04-17: 10 mL
  Filled 2020-04-17: qty 10

## 2020-04-17 MED ORDER — SODIUM CHLORIDE 0.9 % IV SOLN
600.0000 mg/m2 | Freq: Once | INTRAVENOUS | Status: AC
  Administered 2020-04-17: 988 mg via INTRAVENOUS
  Filled 2020-04-17: qty 25.98

## 2020-04-17 MED ORDER — HEPARIN SOD (PORK) LOCK FLUSH 100 UNIT/ML IV SOLN
500.0000 [IU] | Freq: Once | INTRAVENOUS | Status: AC | PRN
  Administered 2020-04-17: 500 [IU]
  Filled 2020-04-17: qty 5

## 2020-04-17 MED ORDER — SODIUM CHLORIDE 0.9 % IV SOLN
Freq: Once | INTRAVENOUS | Status: AC
  Filled 2020-04-17: qty 250

## 2020-04-17 MED ORDER — SODIUM CHLORIDE 0.9% FLUSH
10.0000 mL | Freq: Once | INTRAVENOUS | Status: AC
  Administered 2020-04-17: 10 mL
  Filled 2020-04-17: qty 10

## 2020-04-17 MED ORDER — PROCHLORPERAZINE MALEATE 10 MG PO TABS
10.0000 mg | ORAL_TABLET | Freq: Once | ORAL | Status: AC
  Administered 2020-04-17: 10 mg via ORAL

## 2020-04-17 MED ORDER — PROCHLORPERAZINE MALEATE 10 MG PO TABS
ORAL_TABLET | ORAL | Status: AC
  Filled 2020-04-17: qty 1

## 2020-04-17 NOTE — Progress Notes (Signed)
ok to treat with Scr of 1.73 and HR of 59 per Sandi Mealy, PA

## 2020-04-17 NOTE — Patient Instructions (Signed)
[facility] Discharge Instructions for Patients Receiving Chemotherapy  Today you received the following chemotherapy agents Gemcitabine (GEMZAR).  To help prevent nausea and vomiting after your treatment, we encourage you to take your nausea medication as prescribed.   If you develop nausea and vomiting that is not controlled by your nausea medication, call the clinic.   BELOW ARE SYMPTOMS THAT SHOULD BE REPORTED IMMEDIATELY:  *FEVER GREATER THAN 100.5 F  *CHILLS WITH OR WITHOUT FEVER  NAUSEA AND VOMITING THAT IS NOT CONTROLLED WITH YOUR NAUSEA MEDICATION  *UNUSUAL SHORTNESS OF BREATH  *UNUSUAL BRUISING OR BLEEDING  TENDERNESS IN MOUTH AND THROAT WITH OR WITHOUT PRESENCE OF ULCERS  *URINARY PROBLEMS  *BOWEL PROBLEMS  UNUSUAL RASH Items with * indicate a potential emergency and should be followed up as soon as possible.  Feel free to call the clinic should you have any questions or concerns. The clinic phone number is [phone].  Please show the CHEMO ALERT CARD at check-in to the Emergency Department and triage nurse.   

## 2020-04-18 DIAGNOSIS — H26493 Other secondary cataract, bilateral: Secondary | ICD-10-CM | POA: Diagnosis not present

## 2020-04-18 DIAGNOSIS — H43812 Vitreous degeneration, left eye: Secondary | ICD-10-CM | POA: Diagnosis not present

## 2020-04-18 DIAGNOSIS — H04123 Dry eye syndrome of bilateral lacrimal glands: Secondary | ICD-10-CM | POA: Diagnosis not present

## 2020-04-18 DIAGNOSIS — H11153 Pinguecula, bilateral: Secondary | ICD-10-CM | POA: Diagnosis not present

## 2020-04-18 DIAGNOSIS — E119 Type 2 diabetes mellitus without complications: Secondary | ICD-10-CM | POA: Diagnosis not present

## 2020-04-18 DIAGNOSIS — Z961 Presence of intraocular lens: Secondary | ICD-10-CM | POA: Diagnosis not present

## 2020-04-18 LAB — HM DIABETES EYE EXAM

## 2020-04-19 ENCOUNTER — Ambulatory Visit: Payer: Medicare Other | Admitting: Hematology

## 2020-04-19 ENCOUNTER — Other Ambulatory Visit: Payer: Medicare Other

## 2020-04-19 NOTE — Progress Notes (Signed)
Symptoms Management Clinic Progress Note   Donna May [id number] [birth year] 80 y.o.  Donna May is managed by Dr. [name]  Actively treated with chemotherapy/immunotherapy/hormonal therapy: yes  Current therapy: Gemcitabine  Last treated: 04/03/2020 (cycle 31, day 1)  Next scheduled appointment with provider: 05/01/2020  Assessment: Plan:    Intrahepatic cholangiocarcinoma ([place])   Intrahepatic cholangiocarcinoma, cT1N0M1,with peritoneal metastasis, MSS, IDH1 mutation (+): Ms. Jasinski presents to the clinic today for consideration of cycle 31, day 1 of gemcitabine.  We will proceed with her treatment today and will have her return to clinic on 05/01/2020 for consideration of her next cycle of chemotherapy.  Please see After Visit Summary for patient specific instructions.  Future Appointments  Date Time Provider [place]  04/24/2020 10:00 AM [name], NP [facility] None  05/01/2020  9:30 AM [facility] LAB [facility] None  05/01/2020  9:45 AM [facility] [place] FLUSH [facility] None  05/01/2020 10:20 AM [name], MD [facility] None  05/01/2020 11:00 AM [facility] INFUSION [facility] None  05/03/2020  9:45 AM Deberah Pelton, NP [facility]  05/15/2020  8:30 AM [facility] LAB [facility] None  05/15/2020  8:45 AM [facility] [place] FLUSH [facility] None  05/15/2020  9:20 AM [name], MD [facility] None  05/15/2020 10:00 AM [facility] INFUSION [facility] None  05/31/2020 11:00 AM [name], Mihai, MD [facility]    No orders of the defined types were placed in this encounter.      Subjective:   Patient ID:  Donna May is a 80 y.o. (DOB October 30, 1940) female.  Chief Complaint: No chief complaint on file.   HPI Donna May is a 80 y.o. female with a diagnosis of intrahepatic cholangiocarcinoma.  She is followed by Dr. [name] and presents to the clinic today for consideration of cycle #31, day 1 of gemcitabine.  Overall she continues to do  well with no acute issues of concern today.  She denies fevers, chills, sweats, nausea, vomiting, constipation, diarrhea, or anorexia.   Medications: I have reviewed the patient's current medications.  Allergies: No Known Allergies  Past Medical History:  Diagnosis Date  . Anemia   . Anxiety   . Arthritis   . Breast cancer ([place]) 1992  . Cataract    Bilateral eyes - surgery to remove  . Chronic diastolic CHF (congestive heart failure) ([place])   . Chronic kidney disease    CKD stage 3  . Complication of anesthesia    "something they use make me itch for a couple of days."  . Depression   . Diabetes mellitus    type 2  . Duodenitis 01/18/2002  . Fainting spell   . GERD (gastroesophageal reflux disease)   . Heart murmur    never has caused any problems  . Hiatal hernia 08/08/2008, 01/18/2002  . History of pneumonia    x 2  . Hyperlipidemia   . Hypertension   . Liver cancer ([place]) 08/2018  . Lymphedema 2017   Right arm    Past Surgical History:  Procedure Laterality Date  . AXILLARY SURGERY     cyst removal, right  . COLONOSCOPY  09/23/2018   Dr. Havery Moros - polyps  . EYE SURGERY Bilateral    cataracts to remove  . LAPAROSCOPY N/A 11/17/2018   Procedure: LAPAROSCOPY DIAGNOSTIC, INTRAOPERATIVE ULTRASOUND, PERITONEAL BIOPSIES;  Surgeon: [name], MD;  Location: [place];  Service: General;  Laterality: N/A;  GENERAL AND EPIDURAL  . LIVER BIOPSY  08/2018   Dr. Lindwood Coke  .  MASTECTOMY  1992   right, with flap  . NM MYOCAR PERF WALL MOTION  06/11/2009   Protocol:[place], post stress EF58%, EKG negative for ischemia, low risk  . PORTACATH PLACEMENT N/A 12/02/2018   Procedure: INSERTION PORT-A-CATH;  Surgeon: [name], MD;  Location: [place];  Service: General;  Laterality: N/A;  . RECONSTRUCTION BREAST W/ TRAM FLAP Right   . TONSILLECTOMY    . TRANSTHORACIC ECHOCARDIOGRAM  12/24/2009   LVEF =>55%, normal study  . UPPER GI ENDOSCOPY      Family History  Problem Relation  Age of Onset  . Breast cancer Cousin        diagnosed >50; mother's first cousins  . Diabetes Brother   . Heart disease Mother   . Hypertension Mother   . Heart disease Father   . Hypertension Father   . Prostate cancer Brother 61  . Heart attack Maternal Grandmother   . Stroke Maternal Grandfather   . Colon cancer Neg Hx   . Esophageal cancer Neg Hx   . Stomach cancer Neg Hx   . Rectal cancer Neg Hx     Social History   Socioeconomic History  . Marital status: Single    Spouse name: Not on file  . Number of children: 2  . Years of education: Not on file  . Highest education level: Not on file  Occupational History  . Occupation: retired  Tobacco Use  . Smoking status: Former Smoker    Packs/day: 0.25    Years: 25.00    Pack years: 6.25    Types: Cigarettes    Quit date: 1990    Years since quitting: 32.1  . Smokeless tobacco: Never Used  Vaping Use  . Vaping Use: Never used  Substance and Sexual Activity  . Alcohol use: Not Currently    Alcohol/week: 1.0 standard drink    Types: 1 Glasses of wine per week    Comment: occasional wine  . Drug use: No  . Sexual activity: Yes    Partners: Male    Birth control/protection: Condom, Post-menopausal    Comment: First sexual encounter age 47. Fewer than 5 partners in life time.  Other Topics Concern  . 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   Social Determinants of Health   Financial Resource Strain: [place]   . Difficulty of Paying Living Expenses: Not hard at all  Food Insecurity: No Food Insecurity  . Worried About Charity fundraiser in the Last Year: Never true  . Ran Out of Food in the Last Year: Never true  Transportation Needs: No Transportation Needs  . Lack of Transportation  (Medical): No  . Lack of Transportation (Non-Medical): No  Physical Activity: Inactive  . Days of Exercise per Week: 0 days  . Minutes of Exercise per Session: 0 min  Stress: No Stress Concern Present  . Feeling of Stress : Not at all  Social Connections: Moderately Isolated  . Frequency of Communication with Friends and Family: More than three times a week  . Frequency of Social Gatherings with Friends and Family: More than three times a week  . Attends Religious Services: More than 4 times per year  . Active Member of Clubs  or Organizations: No  . Attends Banker Meetings: Never  . Marital Status: Widowed  Intimate Partner Violence: Not At Risk  . Fear of Current or Ex-Partner: No  . Emotionally Abused: No  . Physically Abused: No  . Sexually Abused: No    Past Medical History, Surgical history, Social history, and Family history were reviewed and updated as appropriate.   Please see review of systems for further details on the patient's review from today.   Review of Systems:  Review of Systems  Constitutional: Negative for chills, diaphoresis and fever.  HENT: Negative for trouble swallowing and voice change.   Respiratory: Negative for cough, chest tightness, shortness of breath and wheezing.   Cardiovascular: Negative for chest pain and palpitations.  Gastrointestinal: Negative for abdominal pain, constipation, diarrhea, nausea and vomiting.  Musculoskeletal: Negative for back pain and myalgias.  Neurological: Negative for dizziness, light-headedness and headaches.    Objective:   Physical Exam:  LMP  (LMP Unknown)  ECOG: 0  Physical Exam Constitutional:      General: She is not in acute distress.    Appearance: She is not diaphoretic.  HENT:     Head: Normocephalic and atraumatic.  Eyes:     General: No scleral icterus.       Right eye: No discharge.        Left eye: No discharge.     Conjunctiva/sclera: Conjunctivae normal.  Cardiovascular:      Rate and Rhythm: Normal rate and regular rhythm.     Heart sounds: Normal heart sounds. No murmur heard. No friction rub. No gallop.   Pulmonary:     Effort: Pulmonary effort is normal. No respiratory distress.     Breath sounds: Normal breath sounds. No wheezing or rales.  Abdominal:     General: Bowel sounds are normal. There is no distension.     Tenderness: There is no abdominal tenderness.  Skin:    General: Skin is warm and dry.     Findings: No erythema or rash.  Neurological:     Mental Status: She is alert.  Psychiatric:        Mood and Affect: Mood normal.        Behavior: Behavior normal.        Thought Content: Thought content normal.        Judgment: Judgment normal.     Lab Review:     Component Value Date/Time   NA 142 04/17/2020 1211   NA 141 01/29/2020 0840   NA 144 11/19/2015 0921   K 4.6 04/17/2020 1211   K 4.8 11/19/2015 0921   CL 108 04/17/2020 1211   CO2 28 04/17/2020 1211   CO2 27 11/19/2015 0921   GLUCOSE 118 (H) 04/17/2020 1211   GLUCOSE 109 11/19/2015 0921   BUN 50 (H) 04/17/2020 1211   BUN 37 (H) 01/29/2020 0840   BUN 14.4 11/19/2015 0921   CREATININE 1.73 (H) 04/17/2020 1211   CREATININE 1.30 (H) 10/23/2019 1150   CREATININE 1.2 (H) 11/19/2015 0921   CALCIUM 8.8 (L) 04/17/2020 1211   CALCIUM 9.8 11/19/2015 0921   PROT 7.1 04/17/2020 1211   PROT 6.6 10/07/2017 1251   PROT 7.2 11/19/2015 0921   ALBUMIN 3.4 (L) 04/17/2020 1211   ALBUMIN 4.2 10/07/2017 1251   ALBUMIN 3.6 11/19/2015 0921   AST 17 04/17/2020 1211   AST 20 11/19/2015 0921   ALT 10 04/17/2020 1211   ALT 14 11/19/2015 0921   ALKPHOS 54 04/17/2020  1211   ALKPHOS 59 11/19/2015 0921   BILITOT 0.3 04/17/2020 1211   BILITOT 0.41 11/19/2015 0921   GFRNONAA 29 (L) 04/17/2020 1211   GFRNONAA 39 (L) 10/23/2019 1150   GFRAA 30 (L) 01/29/2020 0840   GFRAA 31 (L) 12/13/2019 0812   GFRAA 45 (L) 10/23/2019 1150       Component Value Date/Time   WBC 5.2 04/17/2020 1211   WBC  9.0 01/18/2020 0532   RBC 2.98 (L) 04/17/2020 1211   HGB 9.2 (L) 04/17/2020 1211   HGB 12.7 11/19/2015 0921   HCT 29.0 (L) 04/17/2020 1211   HCT 39.8 11/19/2015 0921   PLT 204 04/17/2020 1211   PLT 222 11/19/2015 0921   MCV 97.3 04/17/2020 1211   MCV 87.3 11/19/2015 0921   MCH 30.9 04/17/2020 1211   MCHC 31.7 04/17/2020 1211   RDW 14.5 04/17/2020 1211   RDW 15.9 (H) 11/19/2015 0921   LYMPHSABS 1.9 04/17/2020 1211   LYMPHSABS 2.0 11/19/2015 0921   MONOABS 0.6 04/17/2020 1211   MONOABS 0.6 11/19/2015 0921   EOSABS 0.1 04/17/2020 1211   EOSABS 0.3 11/19/2015 0921   BASOSABS 0.0 04/17/2020 1211   BASOSABS 0.0 11/19/2015 0921   -------------------------------  Imaging from last 24 hours (if applicable):  Radiology interpretation: No results found.

## 2020-04-22 ENCOUNTER — Telehealth: Payer: Self-pay

## 2020-04-22 NOTE — Telephone Encounter (Signed)
1120 am.  Phone call made to patient to schedule and in-person visit for next week.  No answer and message has been left.  1122 am.  Incoming call from patient and patient is agreeable to a visit next Monday but is requesting a call prior to coming.

## 2020-04-24 ENCOUNTER — Other Ambulatory Visit: Payer: Self-pay

## 2020-04-24 ENCOUNTER — Other Ambulatory Visit: Payer: Self-pay | Admitting: *Deleted

## 2020-04-24 NOTE — Patient Outreach (Signed)
[facility]) Care Management  04/24/2020  Donna May [birth year] [id number]  Telephone outreach for follow up HF and chemo tolerance.  Donna May reports she has had 2 infusions and is tolerating the txs well. She is not having any increased SOB or edema. Her weight is stable within 3#, today she is 119#  Goals    . patient     Take a trip; slow down work Can take a trip; bus trip; Sr Citizen trip [employer] and gets vacation trips planner      . Patient Stated     Will quit working!    . Patient Stated     When you feeling down;  Go shopping and not buy anything. Visit the Senior center;  [place] a trip; give them a call     . Patient Stated     Retire fully!     . Patient Stated     Will participate in phone calls with [facility] care manager, over the next 6 months. 01/10/20 Reinforced the importance of our calls to report in her progress or challenges. 03/27/20 Donna May has been happy and enthusiastic with her participation with [employer]. 04/24/20 Donna May called me today vs nurse calling her on scheduled day. She is doing well. Restarted chemo and tolerating it well without any new complications. She will have a palliative care nurse visit next Monday 04/29/20.    . Track and Manage Symptoms as evidenced by pt report to nurse on each call over the next 6 months.     Follow Up Date 05/23/20  Follow HF Action Plan (self assessment, daily wt, take meds, get some exercise, know what zone you are in Green, Yellow or Red) Call MD or nurse when in yellow zone to avoid hospitalization.    Why is this important?   You will be able to handle your symptoms better if you keep track of them.  Making some simple changes to your lifestyle will help.  Eating healthy is one thing you can do to take good care of yourself.    Notes: Has Disease management calendar and has started recording her wt. She is familiarizing herself  with the HF action plan. 01/24/20 Had another hospitalization for HF, today first discharge conversation. Reviewed HF management principles, diet and need for primary care appt along with scheduled cardiology and oncology appts. Need to work on recognizing sxs and seeking help before needing to go to the hospital. 02/22/20 Pt seems to be at dry wt 117-120#, No SOB nor edema. Creat 1.7, GFR 30. Stockings have been ordered and should arrive this week. Reinforced pt to call NP or cardiologist with increasing sxs! 04/24/20 Stable HF, no exacerbating sxs, following Action Plan. No hospitalization since 01/17/21.       We agreed to talk again in one month.  [name]. [name], MSN, [facility] Gerontological Nurse Practitioner [facility] Care Management [phone]

## 2020-04-26 NOTE — Progress Notes (Signed)
[place]   [phone] [phone]   Clinic Follow up Note   Patient Care Team: [name], MD as PCP - General (Family Medicine) [name], [name], MD as PCP - Cardiology (Cardiology) [name], [name], MD as Consulting Physician (Cardiology) [name], MD as Consulting Physician (Obstetrics and Gynecology) [name], [name], NP as Nurse Practitioner (Hematology and Oncology) [facility] [name], MD as Consulting Physician (Hematology) [name], [name], MD as Consulting Physician (Gastroenterology) Virgina Evener, Dawn, RN (Inactive) as Oncology Nurse Navigator [name], MD as Consulting Physician (General Surgery) [name], NP as [place] Management  Date of Service:  05/01/2020  CHIEF COMPLAINT: F/u ofcholangiocarcinomaof liver  SUMMARY OF ONCOLOGIC HISTORY: Oncology History Overview Note  Cancer Staging Intrahepatic cholangiocarcinoma ([place]) Staging form: Intrahepatic Bile Duct, AJCC 8th Edition - Clinical stage from 09/23/2018: Stage IB (cT1b, cN0, cM0) - Signed by [name], MD on 10/06/2018    Malignant neoplasm of female breast ([place])  11/06/2018 Genetic Testing   Negative genetic testing on the [facility] Common Hereditary Cancers panel. A variant of uncertain significance was identified in one of her APC genes, called c.1243G>A (p.Ala415Thr).  The Common Hereditary Cancers Panel offered by [facility] includes sequencing and/or deletion duplication testing of the following 48 genes: APC, ATM, AXIN2, BARD1, BMPR1A, BRCA1, BRCA2, BRIP1, CDH1, CDK4, CDKN2A (p14ARF), CDKN2A (p16INK4a), CHEK2, CTNNA1, DICER1, EPCAM (Deletion/duplication testing only), GREM1 (promoter region deletion/duplication testing only), KIT, MEN1, MLH1, MSH2, MSH3, MSH6, MUTYH, NBN, NF1, NHTL1, PALB2, PDGFRA, PMS2, POLD1, POLE, PTEN, RAD50, RAD51C, RAD51D, RNF43, SDHB, SDHC, SDHD, SMAD4, SMARCA4. STK11, TP53,  TSC1, TSC2, and VHL.  The following genes were evaluated for sequence changes only: SDHA and HOXB13 c.251G>A variant only.    Intrahepatic cholangiocarcinoma ([place])  09/07/2018 Imaging   CT Chest IMPRESSION: 1. New, enhancing mass involving segment 4 of the liver and fundus of gallbladder is concerning for malignancy. This may represent either metastatic disease from breast cancer or neoplasm primary to the liver or hepatic biliary tree. Further evaluation with contrast enhanced CT of the abdomen and pelvis is recommended. 2. No findings to suggest metastatic disease within the chest. 3.  Aortic Atherosclerosis (ICD10-I70.0). 4. Coronary artery calcifications.   09/13/2018 Pathology Results   Diagnosis Liver, needle/core biopsy - ADENOCARCINOMA. Microscopic Comment Immunohistochemistry for CK7 is positive. CK20, TTF1, CDX-2, GATA-3, PAX 8, Qualitative ER, p63 and CK5/6 are negative. The provided clinical history of remote mammary carcinoma is noted. Based on the morphology and immunophenotype of the adenocarcinoma observed in this specimen, primary cholangiocarcinoma is favored. Clinical and radiologic correlation are  encouraged. Results reported to [employer] on 09/15/2018. Intradepartmental consultation (Dr. Vic Ripper).   09/13/2018 Initial Diagnosis   Cholangiocarcinoma ([place])   09/23/2018 Procedure   Colonoscopy by Dr. Havery Moros 09/23/18  IMPRESSION - Two 3 to 4 mm polyps in the ascending colon, removed with a cold snare. Resected and retrieved. - Five 3 to 5 mm polyps in the transverse colon, removed with a cold snare. Resected and retrieved. - One 5 mm polyp at the splenic flexure, removed with a cold snare. Resected and retrieved. - Three 3 to 5 mm polyps in the sigmoid colon, removed with a cold snare. Resected and retrieved. - The examination was otherwise normal. Upper Endopscy by Dr. Havery Moros 09/23/18  IMPRESSION - Esophagogastric landmarks identified. - 2 cm hiatal  hernia. - Normal esophagus otherwise. - A single gastric polyp. Resected and retrieved. - Mild gastritis. Biopsied. - Normal duodenal bulb and  second portion of the duodenum.   09/23/2018 Pathology Results   Diagnosis 09/23/18 1. Surgical [P], duodenum - BENIGN SMALL BOWEL MUCOSA. - NO ACTIVE INFLAMMATION OR VILLOUS ATROPHY IDENTIFIED. 2. Surgical [P], stomach, polyp - HYPERPLASTIC POLYP(S). - THERE IS NO EVIDENCE OF MALIGNANCY. 3. Surgical [P], gastric antrum and gastric body - CHRONIC INACTIVE GASTRITIS. - THERE IS NO EVIDENCE OF HELICOBACTER-PYLORI, DYSPLASIA, OR MALIGNANCY. - SEE COMMENT. 4. Surgical [P], colon, sigmoid, splenic flexure, transverse and ascending, polyp (9) - TUBULAR ADENOMA(S). - SESSILE SERRATED POLYP WITHOUT CYTOLOGIC DYSPLASIA. - HIGH GRADE DYSPLASIA IS NOT IDENTIFIED. 5. Surgical [P], colon, sigmoid, polyp (2) - HYPERPLASTIC POLYP(S). - THERE IS NO EVIDENCE OF MALIGNANCY.   09/23/2018 Cancer Staging   Staging form: Intrahepatic Bile Duct, AJCC 8th Edition - Clinical stage from 09/23/2018: Stage IB (cT1b, cN0, cM0) - Signed by [name], MD on 10/06/2018   09/29/2018 PET scan   PET 09/29/18 IMPRESSION: 1. Hypermetabolic mass in the RIGHT hepatic lobe consistent with biopsy proven adenocarcinoma. No additional liver metastasis. 2. No evidence of local breast cancer recurrence in the RIGHT breast or RIGHT axilla. 3. Mild bilateral hypermetabolic adrenal glands is favored benign hyperplasia. 4. No evidence of additional metastatic disease on skull base to thigh FDG PET scan.   11/06/2018 Genetic Testing   Negative genetic testing on the [facility] Common Hereditary Cancers panel. A variant of uncertain significance was identified in one of her APC genes, called c.1243G>A (p.Ala415Thr).  The Common Hereditary Cancers Panel offered by [facility] includes sequencing and/or deletion duplication testing of the following 48 genes: APC, ATM, AXIN2, BARD1, BMPR1A, BRCA1,  BRCA2, BRIP1, CDH1, CDK4, CDKN2A (p14ARF), CDKN2A (p16INK4a), CHEK2, CTNNA1, DICER1, EPCAM (Deletion/duplication testing only), GREM1 (promoter region deletion/duplication testing only), KIT, MEN1, MLH1, MSH2, MSH3, MSH6, MUTYH, NBN, NF1, NHTL1, PALB2, PDGFRA, PMS2, POLD1, POLE, PTEN, RAD50, RAD51C, RAD51D, RNF43, SDHB, SDHC, SDHD, SMAD4, SMARCA4. STK11, TP53, TSC1, TSC2, and VHL.  The following genes were evaluated for sequence changes only: SDHA and HOXB13 c.251G>A variant only.    11/17/2018 Pathology Results   Diagnosis 11/17/18 1. Soft tissue, biopsy, Diaphragmatic nodules - METASTATIC ADENOCARCINOMA, CONSISTENT WITH PATIENT'S CLINICAL HISTORY OF CHOLANGIOCARCINOMA. SEE NOTE 2. Liver, biopsy, Left - LIVER PARENCHYMA WITH A BENIGN FIBROTIC NODULE - NO EVIDENCE OF MALIGNANCY 3. Stomach, biopsy - BENIGN PAPILLARY MESOTHELIAL HYPERPLASIA - NO EVIDENCE OF MALIGNANCY   11/29/2018 Imaging   CT CAP WO Contrast  IMPRESSION: 1. Dominant liver mass appears grossly stable from 09/29/2018. Additional liver lesions are too small to characterize but were not shown to be hypermetabolic on PET. 2. Mild nodularity of both adrenal glands with associated hypermetabolism on 09/29/2018. Continued attention on follow-up exams is warranted. 3. Small right lower lobe nodules, stable from 09/07/2018. Again, attention on follow-up is recommended. 4. Trace bilateral pleural fluid. 5. Aortic atherosclerosis (ICD10-170.0). Coronary artery calcification. 6. Enlarged pulmonic trunk, indicative of pulmonary arterial hypertension.     11/30/2018 - 06/14/2019 Chemotherapy   First line chemo Oxaliplatin and gemcitabine q2weeks starting 11/30/18. Stopped Oxaliplatin on 05/03/19 due to worsening neuropathy. Added Cisplatin with C13 on 05/17/19 and stopped on C15 06/14/19 due to neuropathy. Reduced to maintenance single agent Gemcitabine on 06/28/19.    01/20/2019 Imaging   CT CAP IMPRESSION: restaging  1. Mild interval  increase in size of dominant liver mass involving segment 4 and segment 5. 2. No significant or progressive adrenal nodularity identified to suggest metastatic disease. 3. Unchanged appearance of small right lower lobe and lingular lung nodules, nonspecific.  03/21/2019 PET scan   IMPRESSION: 1. Large right hepatic mass with SUV uptake near background hepatic activity, dramatic response to therapy, also with decrease in size when compared to the prior study. 2. Signs of prior right mastectomy and axillary dissection as before. 3. Left adrenal activity in no longer above the level of activity seen in the contralateral, right adrenal gland or in the liver. Attention on follow-up. 4. No new signs of disease.   06/27/2019 PET scan   IMPRESSION: 1. Further decrease in size of a dominant hepatic mass which is non FDG avid. 2. No evidence of metastatic disease. 3. No evidence of left adrenal hypermetabolism or mass. 4. Incidental findings, including: Uterine fibroids. Coronary artery atherosclerosis. Aortic Atherosclerosis (ICD10-I70.0). Pulmonary artery enlargement suggests pulmonary arterial hypertension.   06/28/2019 -  Chemotherapy   Maintenance single agent Gemcitabine q2weeks starting on 06/28/19 with C16.  ------On chemo break since 12/27/19.  ------She restarted Single Agent Gemcitabine q2weeks on 04/03/20 due to mild disease progression in liver on 02/2020 PET    09/15/2019 PET scan   IMPRESSION: 1. In the region of regional tumor at the junction of the right and left hepatic lobe, there is continued hypodensity and overall activity slightly less than that of the surrounding normal liver, indicating effective response to therapy. No current worrisome hypermetabolic lesion is identified in the liver or elsewhere. 2. Chronic asymmetric edema in the subcutaneous tissues of the right upper extremity, nonspecific. The patient has had a prior right mastectomy and right axillary  dissection. 3. Other imaging findings of potential clinical significance: Aortic Atherosclerosis (ICD10-I70.0). Coronary atherosclerosis. Mild cardiomegaly. Small right and trace left pleural effusions. Subcutaneous and mild mesenteric edema. Suspected uterine fibroids.   12/18/2019 PET scan   IMPRESSION: 1. No significant abnormal activity to suggest active/recurrent malignancy. 2. Lingual tonsillar activity is mildly increased from prior but probably incidental/physiologic, attention on follow up suggested. 3. Other imaging findings of potential clinical significance: Third spacing of fluid with trace ascites, mesenteric edema, subcutaneous edema, and possibly subtle pulmonary edema. Mild cardiomegaly. Aortic Atherosclerosis (ICD10-I70.0). Coronary atherosclerosis. Right ovarian dermoid. Calcified uterine fibroids. Renal cysts. Right glenohumeral arthropathy. Mild to moderate scoliosis. Suspected pulmonary arterial hypertension.   03/14/2020 PET scan   IMPRESSION: 1. New small focus of hypermetabolism in segment 4A of the liver worrisome for new metastatic focus. 2. No other findings for metastatic disease involving the neck, chest, abdomen or pelvis.        CURRENT THERAPY:  Maintenance single agent Gemcitabine q2weeks starting on 06/28/19 with C16.On chemo break since 12/27/19.She restarted Single Agent Gemcitabine q2weeks on 04/03/20 due to mild disease progression.  INTERVAL HISTORY:  Donna May is here for a follow up. She presents to the clinic alone. She notes she is doing well. She notes her weight was 119 pounds at home. She notes cardiologist wants her to watch her edema. She notes she continues to have appetite. She notes she continues to have neuropathy with tingling in her feet and hands. She may see specialist about this. She also attributes cold weight to her neuropathy.  She notes intermittent loud bowel sounds. She notes occasional warmness of her stomach. She  notes she has BM about daily. She is able to take care of herself independently. She notes she had a fall a few weeks ago on ice from the weather.    REVIEW OF SYSTEMS:   Constitutional: Denies fevers, chills or abnormal weight loss Eyes: Denies blurriness of vision Ears, nose, mouth, throat,  and face: Denies mucositis or sore throat Respiratory: Denies cough, dyspnea or wheezes Cardiovascular: Denies palpitation, chest discomfort or lower extremity swelling Gastrointestinal:  Denies nausea, heartburn or change in bowel habits (+) Loud bowel sounds, occasional warmness of abdomen.  Skin: Denies abnormal skin rashes Lymphatics: Denies new lymphadenopathy or easy bruising Neurological: (+) Neuratrophy is mostly stable.  Behavioral/Psych: Mood is stable, no new changes  All other systems were reviewed with the patient and are negative.  MEDICAL HISTORY:  Past Medical History:  Diagnosis Date  . Anemia   . Anxiety   . Arthritis   . Breast cancer ([place]) 1992  . Cataract    Bilateral eyes - surgery to remove  . Chronic diastolic CHF (congestive heart failure) ([place])   . Chronic kidney disease    CKD stage 3  . Complication of anesthesia    "something they use make me itch for a couple of days."  . Depression   . Diabetes mellitus    type 2  . Duodenitis 01/18/2002  . Fainting spell   . GERD (gastroesophageal reflux disease)   . Heart murmur    never has caused any problems  . Hiatal hernia 08/08/2008, 01/18/2002  . History of pneumonia    x 2  . Hyperlipidemia   . Hypertension   . Liver cancer ([place]) 08/2018  . Lymphedema 2017   Right arm    SURGICAL HISTORY: Past Surgical History:  Procedure Laterality Date  . AXILLARY SURGERY     cyst removal, right  . COLONOSCOPY  09/23/2018   Dr. Havery Moros - polyps  . EYE SURGERY Bilateral    cataracts to remove  . LAPAROSCOPY N/A 11/17/2018   Procedure: LAPAROSCOPY DIAGNOSTIC, INTRAOPERATIVE ULTRASOUND, PERITONEAL BIOPSIES;  Surgeon:  [name], MD;  Location: [facility];  Service: General;  Laterality: N/A;  GENERAL AND EPIDURAL  . LIVER BIOPSY  08/2018   Dr. Lindwood Coke  . MASTECTOMY  1992   right, with flap  . NM MYOCAR PERF WALL MOTION  06/11/2009   Protocol:[place], post stress EF58%, EKG negative for ischemia, low risk  . PORTACATH PLACEMENT N/A 12/02/2018   Procedure: INSERTION PORT-A-CATH;  Surgeon: [name], MD;  Location: [facility];  Service: General;  Laterality: N/A;  . RECONSTRUCTION BREAST W/ TRAM FLAP Right   . TONSILLECTOMY    . TRANSTHORACIC ECHOCARDIOGRAM  12/24/2009   LVEF =>55%, normal study  . UPPER GI ENDOSCOPY      I have reviewed the social history and family history with the patient and they are unchanged from previous note.  ALLERGIES:  has No Known Allergies.  MEDICATIONS:  Current Outpatient Medications  Medication Sig Dispense Refill  . amLODipine (NORVASC) 10 MG tablet Take 1 tablet (10 mg total) by mouth daily. 90 tablet 3  . aspirin 81 MG tablet Take 81 mg by mouth daily.     . carvedilol (COREG) 25 MG tablet Take 1 tablet (25 mg total) by mouth 2 (two) times daily with a meal. 180 tablet 3  . furosemide (LASIX) 40 MG tablet Take 1 tablet (40 mg total) by mouth daily. 30 tablet 11  . glucose blood test strip 1 each by Other route 2 (two) times daily. Use Onetouch verio test strips as instructed to check blood sugar twice daily. 100 each 2  . hydrALAZINE (APRESOLINE) 100 MG tablet Take 1 tablet (100 mg total) by mouth every 8 (eight) hours. 90 tablet 3  . KLOR-CON M10 10 MEQ tablet TAKE 1 TABLET BY MOUTH  EVERY DAY 90 tablet 1  . lansoprazole (PREVACID) 15 MG capsule Take 1 capsule (15 mg total) by mouth daily. 90 capsule 1  . loratadine (CLARITIN) 10 MG tablet Take 10 mg by mouth daily as needed for allergies.    . Omega 3 1000 MG CAPS Take 2,000 mg by mouth daily.     . rosuvastatin (CRESTOR) 20 MG tablet Take 1 tablet (20 mg total) by mouth daily. 90 tablet 3  . sitaGLIPtin (JANUVIA) 100  MG tablet Take 1 tablet (100 mg total) by mouth daily. 90 tablet 3  . valsartan (DIOVAN) 320 MG tablet Take 1 tablet (320 mg total) by mouth daily. 90 tablet 3   Current Facility-Administered Medications  Medication Dose Route Frequency Provider Last Rate Last Admin  . triamcinolone acetonide (KENALOG) 10 MG/ML injection 10 mg  10 mg Other Once Harriet Masson, DPM        PHYSICAL EXAMINATION: ECOG PERFORMANCE STATUS: 2 - Symptomatic, <50% confined to bed  Vitals:   05/01/20 1024  BP: (!) 161/70  Pulse: 70  Resp: 16  Temp: 98.1 F (36.7 C)  SpO2: 100%   Filed Weights   05/01/20 1024  Weight: 123 lb 14.4 oz (56.2 kg)    Due to COVID19 we will limit examination to appearance. Patient had no complaints.  GENERAL:alert, no distress and comfortable SKIN: skin color normal, no rashes or significant lesions EYES: normal, Conjunctiva are pink and non-injected, sclera clear  NEURO: alert & oriented x 3 with fluent speech   LABORATORY DATA:  I have reviewed the data as listed CBC Latest Ref Rng & Units 05/01/2020 04/17/2020 04/03/2020  WBC 4.0 - 10.5 K/uL 5.1 5.2 4.8  Hemoglobin 12.0 - 15.0 g/dL 8.4(L) 9.2(L) 9.2(L)  Hematocrit 36.0 - 46.0 % 26.6(L) 29.0(L) 28.7(L)  Platelets 150 - 400 K/uL 138(L) 204 172     CMP Latest Ref Rng & Units 05/01/2020 04/17/2020 04/03/2020  Glucose 70 - 99 mg/dL 96 118(H) 77  BUN 8 - 23 mg/dL 44(H) 50(H) 47(H)  Creatinine 0.44 - 1.00 mg/dL 1.87(H) 1.73(H) 1.80(H)  Sodium 135 - 145 mmol/L 141 142 143  Potassium 3.5 - 5.1 mmol/L 4.9 4.6 4.4  Chloride 98 - 111 mmol/L 111 108 111  CO2 22 - 32 mmol/L $RemoveB'24 28 25  'rTiLxbNI$ Calcium 8.9 - 10.3 mg/dL 8.7(L) 8.8(L) 8.8(L)  Total Protein 6.5 - 8.1 g/dL 6.9 7.1 7.0  Total Bilirubin 0.3 - 1.2 mg/dL <0.2(L) 0.3 <0.2(L)  Alkaline Phos 38 - 126 U/L 54 54 51  AST 15 - 41 U/L $Remo'15 17 16  'PanLf$ ALT 0 - 44 U/L $Remo'9 10 8      'UFcbQ$ RADIOGRAPHIC STUDIES: I have personally reviewed the radiological images as listed and agreed with the findings in  the report. No results found.   ASSESSMENT & PLAN:  LEESA LEIFHEIT is a 80 y.o. female with   1.Intrahepatic cholangiocarcinoma, cT1N0M1,with peritoneal metastasis, MSS, IDH1 mutation (+) -She was diagnosed in 08/2018. Her biopsy of her liver mass shows adenocarcinoma,most consistent withcholangiocarcinoma.Her EGD/Colonoscopy from 7/10/20was negativeand 09/29/18 PET scanshowedno evidence of distant metastasis. -She was brought to OR on 11/17/18,unfortunately she was found to have peritoneal metastasis to right diaphragm and surgery was aborted. -Given her metastatic cancer, Istarted her onsystemicchemo withFirst line chemoOxaliplatinand gemcitabine q2weeksto control her diseasebeginningon 11/30/18.Due to poor tolerance to Oxaliplatin and Cisplatin from neuropathy, she proceeded withagent Gemcitabineevery 2 weeksstarting 06/28/19 with C16.Dose was reduced to $RemoveBe'600mg'hbxkVomVL$ /m2 since 09/20/19 due to anemia -She has been on chemo break  since 12/2019 -Given PET from 03/14/20 showedconcern for new liver lesion, I restarted her on maintenance Gemcitabine q2 weeks beginning 04/03/20.  -She is tolerating restart of chemo well overall. Her appetite is adequate, neuropathy mostly stable, weight improved.  -Labs reviewed, Hg 8.4, plt 138. Anemia worse after chemo but asymptomatic. Overall adequate to proceed with Gemcitabine today.  -F/u in 2 weeks.  2. Anemia,Secondary to chemoand cancer -Shepreviouslyrequiredblood transfusionsince 03/2019, continue as needed. Will continue prenatal vitamin. -Labs blood transfusion10/25/21 -Hg has been stable in 9 range off chemo. With restart of chemo, Hg has dropped to 8.2 today (05/01/20). She is not much symptomatic  -She can take multivitamin, but closely watch her labs on chemo is best. Will give Blood transfusion if Hg less than 8.   3.Peripheral neuropathy, G3 -secondary to Oxaliplatinand Cisplatin (both d/c) -Due to low tolerance and not much  help, she stopped Lyrica,Gabapentinand Cymbalta.She can use Gabapentin $RemoveBefore'100mg'aVkjCZviHTjjN$  as needed at night for tingling. -She waspreviouslyseen by Dr Mickeal Skinner.She is now being seen byChiropractor Dr Page Spiro. -She remains to have decreased function overall.Stablewith occasional flares. -Will monitor with restart of Gemcitabine. Stable so far.   4. H/o right breast cancer, Geneticsnegative, Osteopenia -s/prightmastectomy in 1992, per patient did not require adjuvant therapy. She was last seen by Dr. Jana Hakim in 2017. -Her4/2019 DEXA shows mild osteopenia. F/u with Gyn about continuing Raloxifene. I do not think she has to continue at this point.  5.Comorbidities:DM,HTN,hiatal hernia, GERD, renal artery stenosis, recent pulmonary edema -Continue medications and f/u with PCP and cardiologist for management. -Due to her uncontrolled hypertension, hydralazine was added,but it is difficult for her to take 3 times a day, she usually takes twice a day -She is on torsemide twice a week, and hydrochlorothiazide daily  6. Family support, Goal of Care Discussion -She has a son and daughterwho are often busy but help as needed. She notes they are aware of her condition. -The patient understands the goal of care is palliative.She is full code now  7.CKD stage III -Continue to monitor.Stable Cr at 1.2-15.  PLAN: -Labs reviewed and adequate to proceed with Gemcitabine today  -Lab, Flush, F/u and Gemcitabine in 2 and 4 weeks. May need blood transfusion next time    No problem-specific Assessment & Plan notes found for this encounter.   No orders of the defined types were placed in this encounter.  All questions were answered. The patient knows to call the clinic with any problems, questions or concerns. No barriers to learning was detected. The total time spent in the appointment was 30 minutes.     [name], MD 05/01/2020   I, Joslyn Devon, am acting as scribe  for [name], MD.   I have reviewed the above documentation for accuracy and completeness, and I agree with the above.

## 2020-05-01 ENCOUNTER — Telehealth: Payer: Self-pay | Admitting: Hematology

## 2020-05-01 ENCOUNTER — Inpatient Hospital Stay: Payer: Medicare Other

## 2020-05-01 ENCOUNTER — Inpatient Hospital Stay (HOSPITAL_BASED_OUTPATIENT_CLINIC_OR_DEPARTMENT_OTHER): Payer: Medicare Other | Admitting: Hematology

## 2020-05-01 ENCOUNTER — Other Ambulatory Visit: Payer: Self-pay

## 2020-05-01 ENCOUNTER — Encounter: Payer: Self-pay | Admitting: Hematology

## 2020-05-01 VITALS — BP 161/70 | HR 70 | Temp 98.1°F | Resp 16 | Ht 65.0 in | Wt 123.9 lb

## 2020-05-01 DIAGNOSIS — G620 Drug-induced polyneuropathy: Secondary | ICD-10-CM | POA: Diagnosis not present

## 2020-05-01 DIAGNOSIS — T451X5A Adverse effect of antineoplastic and immunosuppressive drugs, initial encounter: Secondary | ICD-10-CM | POA: Diagnosis not present

## 2020-05-01 DIAGNOSIS — C221 Intrahepatic bile duct carcinoma: Secondary | ICD-10-CM

## 2020-05-01 DIAGNOSIS — Z5111 Encounter for antineoplastic chemotherapy: Secondary | ICD-10-CM | POA: Diagnosis not present

## 2020-05-01 DIAGNOSIS — C786 Secondary malignant neoplasm of retroperitoneum and peritoneum: Secondary | ICD-10-CM | POA: Diagnosis not present

## 2020-05-01 DIAGNOSIS — D649 Anemia, unspecified: Secondary | ICD-10-CM

## 2020-05-01 DIAGNOSIS — Z95828 Presence of other vascular implants and grafts: Secondary | ICD-10-CM

## 2020-05-01 LAB — CBC WITH DIFFERENTIAL (CANCER CENTER ONLY)
Abs Immature Granulocytes: 0.01 10*3/uL (ref 0.00–0.07)
Basophils Absolute: 0 10*3/uL (ref 0.0–0.1)
Basophils Relative: 0 %
Eosinophils Absolute: 0.1 10*3/uL (ref 0.0–0.5)
Eosinophils Relative: 1 %
HCT: 26.6 % — ABNORMAL LOW (ref 36.0–46.0)
Hemoglobin: 8.4 g/dL — ABNORMAL LOW (ref 12.0–15.0)
Immature Granulocytes: 0 %
Lymphocytes Relative: 26 %
Lymphs Abs: 1.3 10*3/uL (ref 0.7–4.0)
MCH: 30.9 pg (ref 26.0–34.0)
MCHC: 31.6 g/dL (ref 30.0–36.0)
MCV: 97.8 fL (ref 80.0–100.0)
Monocytes Absolute: 1 10*3/uL (ref 0.1–1.0)
Monocytes Relative: 20 %
Neutro Abs: 2.7 10*3/uL (ref 1.7–7.7)
Neutrophils Relative %: 53 %
Platelet Count: 138 10*3/uL — ABNORMAL LOW (ref 150–400)
RBC: 2.72 MIL/uL — ABNORMAL LOW (ref 3.87–5.11)
RDW: 16 % — ABNORMAL HIGH (ref 11.5–15.5)
WBC Count: 5.1 10*3/uL (ref 4.0–10.5)
nRBC: 0 % (ref 0.0–0.2)

## 2020-05-01 LAB — CMP (CANCER CENTER ONLY)
ALT: 9 U/L (ref 0–44)
AST: 15 U/L (ref 15–41)
Albumin: 3.4 g/dL — ABNORMAL LOW (ref 3.5–5.0)
Alkaline Phosphatase: 54 U/L (ref 38–126)
Anion gap: 6 (ref 5–15)
BUN: 44 mg/dL — ABNORMAL HIGH (ref 8–23)
CO2: 24 mmol/L (ref 22–32)
Calcium: 8.7 mg/dL — ABNORMAL LOW (ref 8.9–10.3)
Chloride: 111 mmol/L (ref 98–111)
Creatinine: 1.87 mg/dL — ABNORMAL HIGH (ref 0.44–1.00)
GFR, Estimated: 27 mL/min — ABNORMAL LOW (ref >60)
Glucose, Bld: 96 mg/dL (ref 70–99)
Potassium: 4.9 mmol/L (ref 3.5–5.1)
Sodium: 141 mmol/L (ref 135–145)
Total Bilirubin: <0.2 mg/dL — ABNORMAL LOW (ref 0.3–1.2)
Total Protein: 6.9 g/dL (ref 6.5–8.1)

## 2020-05-01 LAB — SAMPLE TO BLOOD BANK

## 2020-05-01 MED ORDER — SODIUM CHLORIDE 0.9 % IV SOLN: 600.0000 mg/m2 | Freq: Once | INTRAVENOUS | Status: DC

## 2020-05-01 MED ORDER — SODIUM CHLORIDE 0.9% FLUSH
10.0000 mL | Freq: Once | INTRAVENOUS | Status: AC
  Administered 2020-05-01: 10 mL
  Filled 2020-05-01: qty 10

## 2020-05-01 MED ORDER — SODIUM CHLORIDE 0.9 % IV SOLN
600.0000 mg/m2 | Freq: Once | INTRAVENOUS | Status: AC
  Administered 2020-05-01: 988 mg via INTRAVENOUS
  Filled 2020-05-01: qty 25.98

## 2020-05-01 MED ORDER — PROCHLORPERAZINE MALEATE 10 MG PO TABS
10.0000 mg | ORAL_TABLET | Freq: Once | ORAL | Status: AC
  Administered 2020-05-01: 10 mg via ORAL

## 2020-05-01 MED ORDER — PROCHLORPERAZINE MALEATE 10 MG PO TABS
ORAL_TABLET | ORAL | Status: AC
  Filled 2020-05-01: qty 1

## 2020-05-01 MED ORDER — SODIUM CHLORIDE 0.9% FLUSH
10.0000 mL | INTRAVENOUS | Status: DC | PRN
  Administered 2020-05-01: 10 mL
  Filled 2020-05-01: qty 10

## 2020-05-01 MED ORDER — HEPARIN SOD (PORK) LOCK FLUSH 100 UNIT/ML IV SOLN
500.0000 [IU] | Freq: Once | INTRAVENOUS | Status: AC | PRN
  Administered 2020-05-01: 500 [IU]
  Filled 2020-05-01: qty 5

## 2020-05-01 MED ORDER — SODIUM CHLORIDE 0.9 % IV SOLN
Freq: Once | INTRAVENOUS | Status: AC
  Filled 2020-05-01: qty 250

## 2020-05-01 NOTE — Patient Instructions (Signed)
[facility] Discharge Instructions for Patients Receiving Chemotherapy  Today you received the following chemotherapy agents: Gemcitabine (Gemzar)  To help prevent nausea and vomiting after your treatment, we encourage you to take your nausea medication as prescribed.    If you develop nausea and vomiting that is not controlled by your nausea medication, call the clinic.   BELOW ARE SYMPTOMS THAT SHOULD BE REPORTED IMMEDIATELY:  *FEVER GREATER THAN 100.5 F  *CHILLS WITH OR WITHOUT FEVER  NAUSEA AND VOMITING THAT IS NOT CONTROLLED WITH YOUR NAUSEA MEDICATION  *UNUSUAL SHORTNESS OF BREATH  *UNUSUAL BRUISING OR BLEEDING  TENDERNESS IN MOUTH AND THROAT WITH OR WITHOUT PRESENCE OF ULCERS  *URINARY PROBLEMS  *BOWEL PROBLEMS  UNUSUAL RASH Items with * indicate a potential emergency and should be followed up as soon as possible.  Feel free to call the clinic should you have any questions or concerns. The clinic phone number is [phone].  Please show the CHEMO ALERT CARD at check-in to the Emergency Department and triage nurse.   

## 2020-05-01 NOTE — Patient Instructions (Signed)

## 2020-05-01 NOTE — Progress Notes (Signed)
Per dr Burr Medico ok to treat with plts 138,000 and creatinine 1.87

## 2020-05-01 NOTE — Telephone Encounter (Signed)
Scheduled appointments per 2/16 los. Spoke to patient who is aware of appointments dates and times.  

## 2020-05-02 NOTE — Progress Notes (Signed)
Cardiology Clinic Note   Patient Name: Donna May Date of Encounter: 05/03/2020  Primary Care Provider:  [name], MD Primary Cardiologist:  Sanda Klein, MD  Patient Profile    Donna May 80 year old female presents the clinic today for 21-month follow-up evaluation of her acute diastolic heart failure and hypertension.  Past Medical History    Past Medical History:  Diagnosis Date  . Anemia   . Anxiety   . Arthritis   . Breast cancer ([place]) 1992  . Cataract    Bilateral eyes - surgery to remove  . Chronic diastolic CHF (congestive heart failure) ([place])   . Chronic kidney disease    CKD stage 3  . Complication of anesthesia    "something they use make me itch for a couple of days."  . Depression   . Diabetes mellitus    type 2  . Duodenitis 01/18/2002  . Fainting spell   . GERD (gastroesophageal reflux disease)   . Heart murmur    never has caused any problems  . Hiatal hernia 08/08/2008, 01/18/2002  . History of pneumonia    x 2  . Hyperlipidemia   . Hypertension   . Liver cancer ([place]) 08/2018  . Lymphedema 2017   Right arm   Past Surgical History:  Procedure Laterality Date  . AXILLARY SURGERY     cyst removal, right  . COLONOSCOPY  09/23/2018   Dr. Havery Moros - polyps  . EYE SURGERY Bilateral    cataracts to remove  . LAPAROSCOPY N/A 11/17/2018   Procedure: LAPAROSCOPY DIAGNOSTIC, INTRAOPERATIVE ULTRASOUND, PERITONEAL BIOPSIES;  Surgeon: [name], MD;  Location: [place];  Service: General;  Laterality: N/A;  GENERAL AND EPIDURAL  . LIVER BIOPSY  08/2018   Dr. Lindwood Coke  . MASTECTOMY  1992   right, with flap  . NM MYOCAR PERF WALL MOTION  06/11/2009   Protocol:[place], post stress EF58%, EKG negative for ischemia, low risk  . PORTACATH PLACEMENT N/A 12/02/2018   Procedure: INSERTION PORT-A-CATH;  Surgeon: [name], MD;  Location: [facility];  Service: General;  Laterality: N/A;  . RECONSTRUCTION BREAST W/ TRAM FLAP Right   . TONSILLECTOMY     . TRANSTHORACIC ECHOCARDIOGRAM  12/24/2009   LVEF =>55%, normal study  . 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  She was admitted to the hospital 01/18/2020 until 01/22/2020.  She was diagnosed with acute on chronic diastolic CHF, diabetes mellitus with complications of stage III CKD and hypertension.  She presented to the emergency department via EMS for evaluation of sudden onset of shortness of breath that had been present for approximately 6 hours.  When EMS arrived at her place of residence patient had a oxygen saturation of around 70%.  She was placed on nonrebreather but was unable to tolerate it.  She was placed on 3 L of oxygen which improved her O2 saturation to 90%.  In the emergency department her blood pressure was evaluated and read 230/150.  Respiratory rate is in the 40s and her heart rate was 100 bpm.  She was placed on noninvasive mechanical ventilation.  She denied chest pain, nausea, vomiting, diaphoresis and abdominal pain.  She denied urinary symptoms.  Her respiratory viral panel was negative her chest x-ray showed cardiomegaly with diffuse bilateral pulmonary interstitial edema.  EKG showed sinus rhythm with left atrial enlargement.  She received IV diuresis.  Her blood pressure remained high.  At the initiation of her home blood pressure medications blood pressure substantially improved.  Her hydralazine was increased to 100 mg 3 times daily, her metoprolol was transitioned to carvedilol.  Low-salt diet was discussed, volume and fluid restriction.  She admitted to dietary indiscretion eating fast food fairly frequently  which was felt to be a major contributing factor to her heart failure exacerbation.  It was recommended that she have home health PT due to her ambulatory dysfunction and deconditioning.  She presented to the clinic 02/07/2020 for follow-up evaluation and stated she felt fairly well.  She stated she did have some neuropathy in her hands and her feet from cancer treatment.  She had been taking gabapentin which she did not like because it made her tired.  She had been trying her best to stay away from higher sodium food options.  We reviewed daily weights and I expressed that I would like her to call the office with a weight increase of 3 pounds overnight or 5 pounds in a week.  She expressed understanding.  I will gave her the salty 6 diet sheet, asked her to increase her physical activity as tolerated, and follow-up in 3 months.  She presents the clinic today for follow-up evaluation states she feels well.  She continues to notice neuropathy pain in her hands and feet.  Currently she has not taking her gabapentin but is thinking about resuming in the evenings.  She reports that she started cancer treatment again at the beginning of January and has had 3 infusions.  On her follow-up scan she reports spots were noted on her liver.  She will have a repeat scan in March or April.  She also reports that her oncologist is monitoring her hemoglobin.  Since she has been back on her infusions she is noted to be more anemic.  They are watching this closely.  Her blood pressures been well controlled at home.  She reports that today she did not take her amlodipine or carvedilol before her appointment.  We discussed medication compliance.  She reports that she is staying physically active and walks most days of the week.  I will have her maintain a blood pressure log, continue her low-salt diet, and follow-up with Dr. Sallyanne May in 6 months.  Today she denies chest pain, shortness of breath, lower extremity edema, fatigue,  palpitations, melena, hematuria, hemoptysis, diaphoresis, weakness, presyncope, syncope, orthopnea, and PND.  Home Medications    Prior to Admission medications   Medication Sig Start Date End Date Taking? Authorizing Provider  amLODipine (NORVASC) 10 MG tablet Take 1 tablet (10 mg total) by mouth daily. 10/22/19   Danford, Suann Larry, MD  aspirin 81 MG tablet Take 81 mg by mouth daily.     [provider]  carvedilol (COREG) 25 MG tablet Take 1 tablet (25 mg total) by mouth 2 (two) times daily with a meal. 02/15/20   [name], Mihai, MD  furosemide (LASIX) 40 MG tablet Take 1 tablet (40 mg total) by mouth daily. 02/22/20   [name], Mihai, MD  glucose blood test strip 1 each by Other route 2 (two) times daily. Use Onetouch verio test strips as instructed to check blood sugar twice daily. 01/05/19   Elayne Snare, MD  hydrALAZINE (APRESOLINE) 100 MG tablet Take 1 tablet (100 mg total) by mouth every 8 (eight) hours. 02/15/20   [name], Mihai, MD  KLOR-CON M10 10 MEQ tablet TAKE 1 TABLET BY MOUTH EVERY DAY 03/04/20   [name], Mihai, MD  lansoprazole (PREVACID) 15 MG capsule Take 1 capsule (15 mg total) by mouth daily. 06/08/19   [name], [name], MD  loratadine (CLARITIN) 10 MG tablet Take 10 mg by mouth daily as needed for allergies.    [provider]  Omega 3 1000 MG CAPS Take 2,000 mg by mouth daily.     [provider]  rosuvastatin (CRESTOR) 20 MG tablet Take 1 tablet (20 mg total) by mouth daily. 02/16/20 05/16/20  Deberah Pelton, NP  sitaGLIPtin (JANUVIA) 100 MG tablet Take 1 tablet (100 mg total) by mouth daily. 04/08/20   [name], MD  valsartan (DIOVAN) 320 MG tablet Take 1 tablet (320 mg total) by mouth daily. 02/07/20   Deberah Pelton, NP    Family History    Family History  Problem Relation Age of Onset  . Breast cancer Cousin        diagnosed >50; mother's first cousins  . Diabetes Brother   . Heart disease Mother   . Hypertension Mother    . Heart disease Father   . Hypertension Father   . Prostate cancer Brother 85  . Heart attack Maternal Grandmother   . Stroke Maternal Grandfather   . Colon cancer Neg Hx   . Esophageal cancer Neg Hx   . Stomach cancer Neg Hx   . Rectal cancer Neg Hx    She indicated that her mother is deceased. She indicated that her father is deceased. She indicated that only one of her two brothers is alive. She indicated that her maternal grandmother is deceased. She indicated that her maternal grandfather is deceased. She indicated that her paternal grandmother is deceased. She indicated that her paternal grandfather is deceased. She indicated that her maternal uncle is deceased. She indicated that her paternal aunt is deceased. She indicated that her paternal uncle is deceased. She indicated that the status of her cousin is unknown. She indicated that the status of her neg hx is unknown.  Social History    Social History   Socioeconomic History  . Marital status: Single    Spouse name: Not on file  . Number of children: 2  . Years of education: Not on file  . Highest education level: Not on file  Occupational History  . Occupation: retired  Tobacco Use  . Smoking status: Former Smoker    Packs/day: 0.25    Years: 25.00    Pack years: 6.25    Types: Cigarettes    Quit date: 1990    Years since quitting: 32.1  . Smokeless tobacco: Never Used  Vaping Use  . Vaping Use: Never used  Substance and Sexual Activity  . Alcohol use: Not Currently    Alcohol/week: 1.0 standard drink    Types: 1 Glasses of wine per week    Comment: occasional wine  . Drug use: No  . Sexual activity: Yes    Partners: Male    Birth control/protection: Condom, Post-menopausal    Comment: First sexual encounter age 53. Fewer than 5 partners in life time.  Other Topics Concern  . 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   Social Determinants of Health   Financial Resource Strain: [place]   . Difficulty of Paying Living Expenses: Not hard at all  Food Insecurity: No Food Insecurity  . Worried About Charity fundraiser in the Last Year: Never true  . Ran Out of Food in the Last Year: Never true  Transportation Needs: No Transportation Needs  . Lack of Transportation (Medical): No  . Lack of Transportation (Non-Medical): No  Physical Activity: Inactive  . Days of Exercise per Week: 0 days  . Minutes of Exercise per Session: 0 min  Stress: No Stress Concern Present  . Feeling of Stress : Not at all  Social Connections: Moderately Isolated  . Frequency of Communication with Friends and Family: More than three times a week  . Frequency of Social Gatherings with Friends and Family: More than three times a week  . Attends Religious Services: More than 4 times per year  . Active Member of Clubs or Organizations: No  . Attends Archivist Meetings: Never  . Marital Status: Widowed  Intimate Partner Violence: Not At Risk  . Fear of Current or Ex-Partner: No  . Emotionally Abused: No  . Physically Abused: No  . Sexually Abused: No     Review of Systems    General:  No chills, fever, night sweats or weight changes.  Cardiovascular:  No chest pain, dyspnea on exertion, edema, orthopnea, palpitations, paroxysmal nocturnal dyspnea. Dermatological: No rash, lesions/masses Respiratory: No cough, dyspnea Urologic: No hematuria, dysuria Abdominal:   No nausea, vomiting, diarrhea, bright red blood per rectum, melena, or hematemesis Neurologic:  No visual changes, wkns, changes in mental status.  Reports tingling in hands and bilateral feet All other systems reviewed and are otherwise negative except as noted above.  Physical Exam    VS:  BP (!) 136/56   Ht 5\' 5"   (1.651 m)   Wt 122 lb 3.2 oz (55.4 kg)   LMP  (LMP Unknown)   BMI 20.34 kg/m  , BMI Body mass index is 20.34 kg/m. GEN: Well nourished, well developed, in no acute distress. HEENT: normal. Neck: Supple, no JVD, carotid bruits, or masses. Cardiac: RRR, no murmurs, rubs, or gallops. No clubbing, cyanosis, edema.  Radials/DP/PT 2+ and equal bilaterally.  Respiratory:  Respirations regular and unlabored, clear to auscultation bilaterally. GI: Soft, nontender, nondistended, BS + x 4. MS: no deformity or atrophy. Skin: warm and dry, no rash. Neuro:  Strength and sensation are intact. Psych: Normal affect.  Accessory Clinical Findings    Recent Labs: 01/18/2020: B Natriuretic Peptide 1,816.9 04/03/2020: Magnesium 2.4 05/01/2020: ALT 9; BUN 44; Creatinine 1.87; Hemoglobin 8.4; Platelet Count 138; Potassium 4.9; Sodium 141   Recent Lipid Panel    Component Value Date/Time   CHOL 239 (H) 02/14/2020 1122   TRIG 55 02/14/2020 1122   HDL 85 02/14/2020 1122   CHOLHDL 2.8 02/14/2020 1122   CHOLHDL 3 09/01/2018 1435   VLDL 9.2 09/01/2018 1435   LDLCALC 145 (H) 02/14/2020 1122    ECG personally reviewed by me today- none today.  Echocardiogram 01/03/2020 IMPRESSIONS    1. Left ventricular ejection fraction, by estimation, is 60 to 65%. The  left ventricle has normal function. The left ventricle has no regional  wall motion abnormalities. There is moderate concentric left ventricular  hypertrophy. Left ventricular  diastolic parameters are consistent with Grade III diastolic dysfunction  (restrictive).  2. Right ventricular systolic function is normal. The right ventricular  size is normal. There is severely elevated pulmonary artery systolic  pressure.  3. Left atrial size was severely dilated.  4. Right atrial size was moderately dilated.  5. A small pericardial effusion is present. The pericardial effusion is  circumferential.  6. The mitral valve is normal in structure.  Mild mitral valve  regurgitation.  7. Tricuspid valve regurgitation is moderate.  8. The aortic valve is tricuspid. Aortic valve regurgitation is not  visualized. No aortic stenosis is present.  9. The inferior vena cava is dilated in size with >50% respiratory  variability, suggesting right atrial pressure of 8 mmHg.   Comparison(s): No prior Echocardiogram.   Assessment & Plan   1.  Acute on chronic diastolic CHF-continues to be euvolemic today.  No increased DOE or activity intolerance.  Weight today 122.2. Echocardiogram 01/03/2020 showed an EF of 60-65%, G3 DD.   Continue carvedilol Heart healthy low-sodium diet-salty 6 given Increase physical activity as tolerated Daily weights-contact office with a weight increase of 3 pounds overnight or 5 pounds in a week Lower extremity support stockings-Braxton sheet given Elevate lower extremities when not active  Hypertensive urgency-BP today 136/56.    Well-controlled at home. Continue amlodipine, hydralazine, irbesartan, carvedilol Heart healthy low-sodium diet-salty 6 given Increase physical activity as tolerated  Bp- log given  Demand ischemia-denies chest pain.  Had elevated and flat high-sensitivity troponins 53-45.  EKG showed no acute ischemic changes.    Ischemic evaluation deferred.  Hyperlipidemia-02/14/2020: Cholesterol, Total 239; HDL 85; LDL Chol Calc (NIH) 145; Triglycerides 55 Heart healthy low-sodium high-fiber diet Increase physical activity as tolerated  Metastatic cholangiocarcinoma-continues not cardiotoxic therapy in the form of gemctitabine.  Not felt to be a candidate for resection and given that overall poor prognosis. Follows with oncology  Disposition: Follow-up with Dr. Sallyanne May in 6 months.   Jossie Ng. Tam Delisle NP-C    05/03/2020, 9:36 AM [address] Office [phone] Fax [phone]  Notice: This dictation was prepared with Dragon  dictation along with smaller phrase technology. Any transcriptional errors that result from this process are unintentional and may not be corrected upon review.  I spent 15 minutes examining this patient, reviewing medications, and using patient centered shared decision making involving her cardiac care.  Prior to her visit I spent greater than 20 minutes reviewing her past medical history,  medications, and prior cardiac tests.

## 2020-05-03 ENCOUNTER — Other Ambulatory Visit: Payer: Medicare Other

## 2020-05-03 ENCOUNTER — Encounter: Payer: Self-pay | Admitting: General Practice

## 2020-05-03 ENCOUNTER — Other Ambulatory Visit: Payer: Self-pay

## 2020-05-03 ENCOUNTER — Ambulatory Visit (INDEPENDENT_AMBULATORY_CARE_PROVIDER_SITE_OTHER): Payer: Medicare Other | Admitting: General Practice

## 2020-05-03 VITALS — HR 70 | Temp 99.2°F | Wt 122.0 lb

## 2020-05-03 VITALS — BP 136/56 | Ht 65.0 in | Wt 122.2 lb

## 2020-05-03 DIAGNOSIS — I5033 Acute on chronic diastolic (congestive) heart failure: Secondary | ICD-10-CM | POA: Diagnosis not present

## 2020-05-03 DIAGNOSIS — I10 Essential (primary) hypertension: Secondary | ICD-10-CM | POA: Diagnosis not present

## 2020-05-03 DIAGNOSIS — E7800 Pure hypercholesterolemia, unspecified: Secondary | ICD-10-CM | POA: Diagnosis not present

## 2020-05-03 DIAGNOSIS — C221 Intrahepatic bile duct carcinoma: Secondary | ICD-10-CM

## 2020-05-03 DIAGNOSIS — C787 Secondary malignant neoplasm of liver and intrahepatic bile duct: Secondary | ICD-10-CM

## 2020-05-03 DIAGNOSIS — I248 Other forms of acute ischemic heart disease: Secondary | ICD-10-CM | POA: Diagnosis not present

## 2020-05-03 DIAGNOSIS — Z515 Encounter for palliative care: Secondary | ICD-10-CM

## 2020-05-03 MED ORDER — HYDRALAZINE HCL 100 MG PO TABS: ORAL_TABLET | ORAL | 3 refills | Status: DC

## 2020-05-03 NOTE — Progress Notes (Signed)
Thanks

## 2020-05-03 NOTE — Progress Notes (Signed)
PATIENT NAME: Donna May DOB: 1940-07-20 MRN: [id number]  PRIMARY CARE PROVIDER: [name], MD  vRESPONSIBLE PARTY:  Acct ID - Guarantor Home Phone Work Phone Relationship Acct Type  [id number] [name], [name]* [phone]  Self P/F     [address], [place] [address]    PLAN OF CARE and INTERVENTIONS:               1.  GOALS OF CARE/ ADVANCE CARE PLANNING:  Patient desires to remain home and independent for as long as possible.               2.  PATIENT/CAREGIVER EDUCATION:  CHF               4. PERSONAL EMERGENCY PLAN:  Activate 911 for emergencies.               5.  DISEASE STATUS:  Patient is found in her car she is just returning from eating lunch at [employer].  Patient notes her neuropathy is especially worse today.  She relates this to the cold weather.  She is not currently taking gabapentin but states her cardiologist recommended her taking this at bedtime.  Patient states she previously stopped this as it caused her to be drowsiness during the day but she was also taking this 3x daily.  Patient states she may reconsider taking it only at bedtime.  Patient denies issues with bowel or bladder.  She states her bowels are moving almost daily and she has no issues with constipation.  Patient spoke with her cardiologist about taking olive oil if needed for constipation.  She states she was advised to use metamucil or take 1 tsp of olive oil.  Patient continues to weigh herself daily.  Her weights are ranging from 117-122 lbs.  She notes her weight was 122 lbs this am when she saw cardiology.  Patient has been asked to take her blood pressures at home but she states she is having difficulty doing this do to neuropathy.  Patient is asking for education on CHF.  We further discuss this and the symptoms.  She verbalizes understanding of the importance of monitoring her weights, low sodium diet and taking her medications as prescribed.  Follow up on infusion treatments  with the cancer ctr completed.  Patient states she may need a blood transfusion in the next couple of weeks.  Her hgb is down to 8.4.  Patient states she is not having any issues with nausea and vomiting.  I have reviewed discharge instructions with patient after receiving her chemotherapy.  She denies any of the listed symptoms.  Patient is engaging in conversation and life review.  She speaks fondly of her immediate and extended family. Patient is hopeful her neuropathy will begin to improve as there are more activities she would like to participate in.  Patient is agreeable for a follow up next month.     HISTORY OF PRESENT ILLNESS:  80 year old female with a hx of CHF.  Patient is being followed by Palliative Care monthly and PRN.  CODE STATUS: DNR ADVANCED DIRECTIVES: Yes MOST FORM: Yes PPS: 60%   PHYSICAL EXAM:   VITALS: Today's Vitals   05/03/20 1246  Weight: 122 lb (55.3 kg)    LUNGS: CTA CARDIAC: HRR EXTREMITIES: trace edema SKIN: warm and dry.  Denies any skin breakdown. NEURO: A & O x 4       Lorenza Burton, RN

## 2020-05-03 NOTE — Patient Instructions (Signed)
Medication Instructions:  TAKE HYDRALAZINE THREE TIMES DAILY-AM, MIDDLE OF THE DAY AND IN THE EVENING.  *If you need a refill on your cardiac medications before your next appointment, please call your pharmacy*  Lab Work:   Testing/Procedures:  NONE    NONE  Special Instructions PLEASE READ AND FOLLOW SALTY 6-ATTACHED-1,800mg  daily  TAKE AND LOG YOUR BLOOD PRESSURE A COUPLE TIMES A WEEK  Follow-Up: Your next appointment:  6 month(s) In Person with Sanda Klein, MD OR IF UNAVAILABLE JESSE CLEAVER, FNP-C   Please call our office 2 months in advance to schedule this appointment   At [facility], you and your health needs are our priority.  As part of our continuing mission to provide you with exceptional heart care, we have created designated Provider Care Teams.  These Care Teams include your primary Cardiologist (physician) and Advanced Practice Providers (APPs -  Physician Assistants and Nurse Practitioners) who all work together to provide you with the care you need, when you need it.            6 SALTY THINGS TO AVOID     1,800MG  DAILY

## 2020-05-06 ENCOUNTER — Encounter: Payer: Self-pay | Admitting: Endocrinology

## 2020-05-10 NOTE — Progress Notes (Signed)
[place]   [phone] [phone]   Clinic Follow up Note   Patient Care Team: [name], MD as PCP - General (Family Medicine) [name], [name], MD as PCP - Cardiology (Cardiology) [name], [name], MD as Consulting Physician (Cardiology) [name], MD as Consulting Physician (Obstetrics and Gynecology) [name], [name], NP as Nurse Practitioner (Hematology and Oncology) [facility] [name], MD as Consulting Physician (Hematology) [name], [name], MD as Consulting Physician (Gastroenterology) Virgina Evener, Dawn, RN (Inactive) as Oncology Nurse Navigator [name], MD as Consulting Physician (General Surgery) [name], NP as [place] [facility]  Date of Service:  05/15/2020  CHIEF COMPLAINT: F/u ofcholangiocarcinomaof liver  SUMMARY OF ONCOLOGIC HISTORY: Oncology History Overview Note  Cancer Staging Intrahepatic cholangiocarcinoma ([place]) Staging form: Intrahepatic Bile Duct, AJCC 8th Edition - Clinical stage from 09/23/2018: Stage IB (cT1b, cN0, cM0) - Signed by [name], MD on 10/06/2018    Intrahepatic cholangiocarcinoma ([place])  09/07/2018 Imaging   CT Chest IMPRESSION: 1. New, enhancing mass involving segment 4 of the liver and fundus of gallbladder is concerning for malignancy. This may represent either metastatic disease from breast cancer or neoplasm primary to the liver or hepatic biliary tree. Further evaluation with contrast enhanced CT of the abdomen and pelvis is recommended. 2. No findings to suggest metastatic disease within the chest. 3.  Aortic Atherosclerosis (ICD10-I70.0). 4. Coronary artery calcifications.   09/13/2018 Pathology Results   Diagnosis Liver, needle/core biopsy - ADENOCARCINOMA. Microscopic Comment Immunohistochemistry for CK7 is positive. CK20, TTF1, CDX-2, GATA-3, PAX 8, Qualitative ER, p63 and CK5/6 are negative. The provided  clinical history of remote mammary carcinoma is noted. Based on the morphology and immunophenotype of the adenocarcinoma observed in this specimen, primary cholangiocarcinoma is favored. Clinical and radiologic correlation are  encouraged. Results reported to [employer] on 09/15/2018. Intradepartmental consultation (Dr. Vic Ripper).   09/13/2018 Initial Diagnosis   Cholangiocarcinoma ([place])   09/23/2018 Procedure   Colonoscopy by Dr. Havery Moros 09/23/18  IMPRESSION - Two 3 to 4 mm polyps in the ascending colon, removed with a cold snare. Resected and retrieved. - Five 3 to 5 mm polyps in the transverse colon, removed with a cold snare. Resected and retrieved. - One 5 mm polyp at the splenic flexure, removed with a cold snare. Resected and retrieved. - Three 3 to 5 mm polyps in the sigmoid colon, removed with a cold snare. Resected and retrieved. - The examination was otherwise normal. Upper Endopscy by Dr. Havery Moros 09/23/18  IMPRESSION - Esophagogastric landmarks identified. - 2 cm hiatal hernia. - Normal esophagus otherwise. - A single gastric polyp. Resected and retrieved. - Mild gastritis. Biopsied. - Normal duodenal bulb and second portion of the duodenum.   09/23/2018 Pathology Results   Diagnosis 09/23/18 1. Surgical [P], duodenum - BENIGN SMALL BOWEL MUCOSA. - NO ACTIVE INFLAMMATION OR VILLOUS ATROPHY IDENTIFIED. 2. Surgical [P], stomach, polyp - HYPERPLASTIC POLYP(S). - THERE IS NO EVIDENCE OF MALIGNANCY. 3. Surgical [P], gastric antrum and gastric body - CHRONIC INACTIVE GASTRITIS. - THERE IS NO EVIDENCE OF HELICOBACTER-PYLORI, DYSPLASIA, OR MALIGNANCY. - SEE COMMENT. 4. Surgical [P], colon, sigmoid, splenic flexure, transverse and ascending, polyp (9) - TUBULAR ADENOMA(S). - SESSILE SERRATED POLYP WITHOUT CYTOLOGIC DYSPLASIA. - HIGH GRADE DYSPLASIA IS NOT IDENTIFIED. 5. Surgical [P], colon, sigmoid, polyp (2) - HYPERPLASTIC POLYP(S). - THERE IS NO EVIDENCE OF  MALIGNANCY.   09/23/2018 Cancer Staging   Staging form: Intrahepatic Bile Duct, AJCC 8th Edition - Clinical stage  from 09/23/2018: Stage IB (cT1b, cN0, cM0) - Signed by [name], MD on 10/06/2018   09/29/2018 PET scan   PET 09/29/18 IMPRESSION: 1. Hypermetabolic mass in the RIGHT hepatic lobe consistent with biopsy proven adenocarcinoma. No additional liver metastasis. 2. No evidence of local breast cancer recurrence in the RIGHT breast or RIGHT axilla. 3. Mild bilateral hypermetabolic adrenal glands is favored benign hyperplasia. 4. No evidence of additional metastatic disease on skull base to thigh FDG PET scan.   11/06/2018 Genetic Testing   Negative genetic testing on the [employer] Common Hereditary Cancers panel. A variant of uncertain significance was identified in one of her APC genes, called c.1243G>A (p.Ala415Thr).  The Common Hereditary Cancers Panel offered by [employer] includes sequencing and/or deletion duplication testing of the following 48 genes: APC, ATM, AXIN2, BARD1, BMPR1A, BRCA1, BRCA2, BRIP1, CDH1, CDK4, CDKN2A (p14ARF), CDKN2A (p16INK4a), CHEK2, CTNNA1, DICER1, EPCAM (Deletion/duplication testing only), GREM1 (promoter region deletion/duplication testing only), KIT, MEN1, MLH1, MSH2, MSH3, MSH6, MUTYH, NBN, NF1, NHTL1, PALB2, PDGFRA, PMS2, POLD1, POLE, PTEN, RAD50, RAD51C, RAD51D, RNF43, SDHB, SDHC, SDHD, SMAD4, SMARCA4. STK11, TP53, TSC1, TSC2, and VHL.  The following genes were evaluated for sequence changes only: SDHA and HOXB13 c.251G>A variant only.    11/17/2018 Pathology Results   Diagnosis 11/17/18 1. Soft tissue, biopsy, Diaphragmatic nodules - METASTATIC ADENOCARCINOMA, CONSISTENT WITH PATIENT'S CLINICAL HISTORY OF CHOLANGIOCARCINOMA. SEE NOTE 2. Liver, biopsy, Left - LIVER PARENCHYMA WITH A BENIGN FIBROTIC NODULE - NO EVIDENCE OF MALIGNANCY 3. Stomach, biopsy - BENIGN PAPILLARY MESOTHELIAL HYPERPLASIA - NO EVIDENCE OF MALIGNANCY   11/29/2018 Imaging   CT CAP WO  Contrast  IMPRESSION: 1. Dominant liver mass appears grossly stable from 09/29/2018. Additional liver lesions are too small to characterize but were not shown to be hypermetabolic on PET. 2. Mild nodularity of both adrenal glands with associated hypermetabolism on 09/29/2018. Continued attention on follow-up exams is warranted. 3. Small right lower lobe nodules, stable from 09/07/2018. Again, attention on follow-up is recommended. 4. Trace bilateral pleural fluid. 5. Aortic atherosclerosis (ICD10-170.0). Coronary artery calcification. 6. Enlarged pulmonic trunk, indicative of pulmonary arterial hypertension.     11/30/2018 - 06/14/2019 Chemotherapy   First line chemo Oxaliplatin and gemcitabine q2weeks starting 11/30/18. Stopped Oxaliplatin on 05/03/19 due to worsening neuropathy. Added Cisplatin with C13 on 05/17/19 and stopped on C15 06/14/19 due to neuropathy. Reduced to maintenance single agent Gemcitabine on 06/28/19.    01/20/2019 Imaging   CT CAP IMPRESSION: restaging  1. Mild interval increase in size of dominant liver mass involving segment 4 and segment 5. 2. No significant or progressive adrenal nodularity identified to suggest metastatic disease. 3. Unchanged appearance of small right lower lobe and lingular lung nodules, nonspecific.   03/21/2019 PET scan   IMPRESSION: 1. Large right hepatic mass with SUV uptake near background hepatic activity, dramatic response to therapy, also with decrease in size when compared to the prior study. 2. Signs of prior right mastectomy and axillary dissection as before. 3. Left adrenal activity in no longer above the level of activity seen in the contralateral, right adrenal gland or in the liver. Attention on follow-up. 4. No new signs of disease.   06/27/2019 PET scan   IMPRESSION: 1. Further decrease in size of a dominant hepatic mass which is non FDG avid. 2. No evidence of metastatic disease. 3. No evidence of left adrenal  hypermetabolism or mass. 4. Incidental findings, including: Uterine fibroids. Coronary artery atherosclerosis. Aortic Atherosclerosis (ICD10-I70.0). Pulmonary artery enlargement suggests pulmonary arterial hypertension.  06/28/2019 -  Chemotherapy   Maintenance single agent Gemcitabine q2weeks starting on 06/28/19 with C16.  ------On chemo break since 12/27/19.  ------She restarted Single Agent Gemcitabine q2weeks on 04/03/20 due to mild disease progression in liver on 02/2020 PET    09/15/2019 PET scan   IMPRESSION: 1. In the region of regional tumor at the junction of the right and left hepatic lobe, there is continued hypodensity and overall activity slightly less than that of the surrounding normal liver, indicating effective response to therapy. No current worrisome hypermetabolic lesion is identified in the liver or elsewhere. 2. Chronic asymmetric edema in the subcutaneous tissues of the right upper extremity, nonspecific. The patient has had a prior right mastectomy and right axillary dissection. 3. Other imaging findings of potential clinical significance: Aortic Atherosclerosis (ICD10-I70.0). Coronary atherosclerosis. Mild cardiomegaly. Small right and trace left pleural effusions. Subcutaneous and mild mesenteric edema. Suspected uterine fibroids.   12/18/2019 PET scan   IMPRESSION: 1. No significant abnormal activity to suggest active/recurrent malignancy. 2. Lingual tonsillar activity is mildly increased from prior but probably incidental/physiologic, attention on follow up suggested. 3. Other imaging findings of potential clinical significance: Third spacing of fluid with trace ascites, mesenteric edema, subcutaneous edema, and possibly subtle pulmonary edema. Mild cardiomegaly. Aortic Atherosclerosis (ICD10-I70.0). Coronary atherosclerosis. Right ovarian dermoid. Calcified uterine fibroids. Renal cysts. Right glenohumeral arthropathy. Mild to moderate  scoliosis. Suspected pulmonary arterial hypertension.   03/14/2020 PET scan   IMPRESSION: 1. New small focus of hypermetabolism in segment 4A of the liver worrisome for new metastatic focus. 2. No other findings for metastatic disease involving the neck, chest, abdomen or pelvis.        CURRENT THERAPY:  Maintenance single agent Gemcitabine q2weeks starting on 06/28/19 with C16.On chemo break since 12/27/19.She restarted Single Agent Gemcitabine q2weeks on 04/03/20 due to mild disease progression.  INTERVAL HISTORY:  KHRYSTAL JEANMARIE is here for a follow up. She presents to the clinic alone. She notes she is doing fairly well. She notes her neuropathy is still her main concern. She attributes worsened neuropathy to cold weather. This can improve in warmer weather. She is still able to do some housework. She notes she is not currently taking anything for her neuropathy. She would like to retry Gabapentin but only at night given drowsiness. She notes she recently saw her Cardiologist and her heart function is doing well. She notes she feels tired and stiff from the cold weather.    REVIEW OF SYSTEMS:   Constitutional: Denies fevers, chills or abnormal weight loss Eyes: Denies blurriness of vision Ears, nose, mouth, throat, and face: Denies mucositis or sore throat Respiratory: Denies cough, dyspnea or wheezes Cardiovascular: Denies palpitation, chest discomfort or lower extremity swelling Gastrointestinal:  Denies nausea, heartburn or change in bowel habits Skin: Denies abnormal skin rashes Lymphatics: Denies new lymphadenopathy or easy bruising Neurological: (+)Numbness and tingling, feet >hands Behavioral/Psych: Mood is stable, no new changes  All other systems were reviewed with the patient and are negative.  MEDICAL HISTORY:  Past Medical History:  Diagnosis Date  . Anemia   . Anxiety   . Arthritis   . Breast cancer ([place]) 1992  . Cataract    Bilateral eyes - surgery to remove   . Chronic diastolic CHF (congestive heart failure) ([place])   . Chronic kidney disease    CKD stage 3  . Complication of anesthesia    "something they use make me itch for a couple of days."  . Depression   .  Diabetes mellitus    type 2  . Duodenitis 01/18/2002  . Fainting spell   . GERD (gastroesophageal reflux disease)   . Heart murmur    never has caused any problems  . Hiatal hernia 08/08/2008, 01/18/2002  . History of pneumonia    x 2  . Hyperlipidemia   . Hypertension   . Liver cancer ([place]) 08/2018  . Lymphedema 2017   Right arm    SURGICAL HISTORY: Past Surgical History:  Procedure Laterality Date  . AXILLARY SURGERY     cyst removal, right  . COLONOSCOPY  09/23/2018   Dr. Havery Moros - polyps  . EYE SURGERY Bilateral    cataracts to remove  . LAPAROSCOPY N/A 11/17/2018   Procedure: LAPAROSCOPY DIAGNOSTIC, INTRAOPERATIVE ULTRASOUND, PERITONEAL BIOPSIES;  Surgeon: [name], MD;  Location: [facility];  Service: General;  Laterality: N/A;  GENERAL AND EPIDURAL  . LIVER BIOPSY  08/2018   Dr. Lindwood Coke  . MASTECTOMY  1992   right, with flap  . NM MYOCAR PERF WALL MOTION  06/11/2009   Protocol:[place], post stress EF58%, EKG negative for ischemia, low risk  . PORTACATH PLACEMENT N/A 12/02/2018   Procedure: INSERTION PORT-A-CATH;  Surgeon: [name], MD;  Location: [facility];  Service: General;  Laterality: N/A;  . RECONSTRUCTION BREAST W/ TRAM FLAP Right   . TONSILLECTOMY    . TRANSTHORACIC ECHOCARDIOGRAM  12/24/2009   LVEF =>55%, normal study  . UPPER GI ENDOSCOPY      I have reviewed the social history and family history with the patient and they are unchanged from previous note.  ALLERGIES:  has No Known Allergies.  MEDICATIONS:  Current Outpatient Medications  Medication Sig Dispense Refill  . gabapentin (NEURONTIN) 100 MG capsule Take 1 capsule (100 mg total) by mouth every 8 (eight) hours as needed. 180 capsule 2  . amLODipine (NORVASC) 10 MG tablet Take 1 tablet  (10 mg total) by mouth daily. 90 tablet 3  . aspirin 81 MG tablet Take 81 mg by mouth daily.     . carvedilol (COREG) 25 MG tablet Take 1 tablet (25 mg total) by mouth 2 (two) times daily with a meal. 180 tablet 3  . furosemide (LASIX) 40 MG tablet Take 1 tablet (40 mg total) by mouth daily. 30 tablet 11  . glucose blood test strip 1 each by Other route 2 (two) times daily. Use Onetouch verio test strips as instructed to check blood sugar twice daily. 100 each 2  . hydrALAZINE (APRESOLINE) 100 MG tablet TAKE THREE TIMES DAILY-AM, MIDDLE OF THE DAY AND IN THE EVENING. 90 tablet 3  . KLOR-CON M10 10 MEQ tablet TAKE 1 TABLET BY MOUTH EVERY DAY 90 tablet 1  . lansoprazole (PREVACID) 15 MG capsule Take 1 capsule (15 mg total) by mouth daily. 90 capsule 1  . loratadine (CLARITIN) 10 MG tablet Take 10 mg by mouth daily as needed for allergies.    . Omega 3 1000 MG CAPS Take 2,000 mg by mouth daily.     . rosuvastatin (CRESTOR) 20 MG tablet Take 1 tablet (20 mg total) by mouth daily. 90 tablet 3  . sitaGLIPtin (JANUVIA) 100 MG tablet Take 1 tablet (100 mg total) by mouth daily. 90 tablet 3  . valsartan (DIOVAN) 320 MG tablet Take 1 tablet (320 mg total) by mouth daily. 90 tablet 3   Current Facility-Administered Medications  Medication Dose Route Frequency Provider Last Rate Last Admin  . triamcinolone acetonide (KENALOG) 10 MG/ML injection 10 mg  10 mg Other Once Harriet Masson, DPM       Facility-Administered Medications Ordered in Other Visits  Medication Dose Route Frequency Provider Last Rate Last Admin  . gemcitabine (GEMZAR) 988 mg in sodium chloride 0.9 % 100 mL chemo infusion  600 mg/m2 (Treatment Plan Recorded) Intravenous Once [name], MD 252 mL/hr at 05/15/20 1028 988 mg at 05/15/20 1028  . heparin lock flush 100 unit/mL  500 Units Intracatheter Once PRN [name], MD      . sodium chloride flush (NS) 0.9 % injection 10 mL  10 mL Intracatheter PRN [name], MD        PHYSICAL  EXAMINATION: ECOG PERFORMANCE STATUS: 2 - Symptomatic, <50% confined to bed  Vitals:   05/15/20 0901  BP: (!) 149/54  Pulse: 61  Resp: 15  Temp: (!) 97.1 F (36.2 C)  SpO2: 100%   Filed Weights   05/15/20 0901  Weight: 121 lb 6.4 oz (55.1 kg)    Due to COVID19 we will limit examination to appearance. Patient had no complaints.  GENERAL:alert, no distress and comfortable SKIN: skin color normal, no rashes or significant lesions EYES: normal, Conjunctiva are pink and non-injected, sclera clear  NEURO: alert & oriented x 3 with fluent speech   LABORATORY DATA:  I have reviewed the data as listed CBC Latest Ref Rng & Units 05/15/2020 05/01/2020 04/17/2020  WBC 4.0 - 10.5 K/uL 5.6 5.1 5.2  Hemoglobin 12.0 - 15.0 g/dL 8.7(L) 8.4(L) 9.2(L)  Hematocrit 36.0 - 46.0 % 27.4(L) 26.6(L) 29.0(L)  Platelets 150 - 400 K/uL 172 138(L) 204     CMP Latest Ref Rng & Units 05/15/2020 05/01/2020 04/17/2020  Glucose 70 - 99 mg/dL 85 96 118(H)  BUN 8 - 23 mg/dL 43(H) 44(H) 50(H)  Creatinine 0.44 - 1.00 mg/dL 1.56(H) 1.87(H) 1.73(H)  Sodium 135 - 145 mmol/L 140 141 142  Potassium 3.5 - 5.1 mmol/L 4.7 4.9 4.6  Chloride 98 - 111 mmol/L 108 111 108  CO2 22 - 32 mmol/L $RemoveB'25 24 28  'iySbeYGz$ Calcium 8.9 - 10.3 mg/dL 8.9 8.7(L) 8.8(L)  Total Protein 6.5 - 8.1 g/dL 7.0 6.9 7.1  Total Bilirubin 0.3 - 1.2 mg/dL <0.2(L) <0.2(L) 0.3  Alkaline Phos 38 - 126 U/L 51 54 54  AST 15 - 41 U/L $Remo'19 15 17  'aQhZc$ ALT 0 - 44 U/L $Remo'11 9 10      'TkdmQ$ RADIOGRAPHIC STUDIES: I have personally reviewed the radiological images as listed and agreed with the findings in the report. No results found.   ASSESSMENT & PLAN:  Donna May is a 80 y.o. female with    1.Intrahepatic cholangiocarcinoma, cT1N0M1,with peritoneal metastasis, MSS, IDH1 mutation (+) -She was diagnosed in 08/2018. Her biopsy of her liver mass shows adenocarcinoma,most consistent withcholangiocarcinoma.Her EGD/Colonoscopy from 7/10/20was negativeand 09/29/18 PET  scanshowedno evidence of distant metastasis. -She was brought to OR on 11/17/18,unfortunately she was found to have peritoneal metastasis to right diaphragm and surgery was aborted. -Given her metastatic cancer, Istarted her onsystemicchemo withFirst line chemoOxaliplatinand gemcitabine q2weeksto control her diseasebeginningon 11/30/18.Due to poor tolerance to Oxaliplatin and Cisplatin from neuropathy, she proceeded withsingle agent Gemcitabineevery 2 weeksstarting 06/28/19 with C16.She was on chemo break since10/2021. -GivenPET from 03/14/20 showedconcern for new liver lesion, I restarted her on maintenance Gemcitabine q2 weeksbeginning 04/03/20.  -She is tolerating chemo well overall. Her neuropathy mostly stable.  -Labs reviewed, Hg 8.7. Labs overall adequate to proceed with Gemcitabine today at same dose. Will continue every 2 weeks   -F/u  in 2 weeks. Plan to scan her end of March.   2. Anemia,Secondary to chemoand cancer -Shepreviouslyrequiredblood transfusionsince 03/2019, continue as needed. Will continue prenatal vitamin. -Labs blood transfusion10/25/21 -Hg has been stable in 9 range off chemo. With restart of chemo, Hg has dropped to 8.2 today (05/01/20). She is not much symptomatic  -She can take multivitamin, but closely watch her labs on chemo is best. Will give Blood transfusion if Hg less than 8.   3.Peripheral neuropathy, G3, Secondary to Oxaliplatinand Cisplatin (both d/c) -Due to low tolerance and not much help, she stopped Lyrica,Gabapentinand Cymbalta. -She waspreviouslyseen by Dr Mickeal Skinner.She is now being seen byChiropractor Dr Page Spiro. -She remains to have decreased function overall.Stablewith occasional flares from cold weather.  -Will monitor with restart of Gemcitabine.Stable so far.  -Per patient request, I will refill Gabapentin 100mg  to use at night as needed (05/15/20)  4. H/o right breast cancer, Geneticsnegative,  Osteopenia -s/prightmastectomy in 1992, per patient did not require adjuvant therapy. She was last seen by Dr. Jana Hakim in 2017. -Her4/2019 DEXA shows mild osteopenia. F/u with Gyn about continuing Raloxifene. I do not think she has to continue at this point.  5.Comorbidities:DM,HTN,hiatal hernia, GERD, renal artery stenosis, 2021 pulmonary edema -Continue medications and f/u with PCP and cardiologist for management. -She is on torsemide twice a week, and hydrochlorothiazide daily  6. Family support, Goal of Care Discussion -She has a son and daughterwho are often busy but help as needed. -The patient understands the goal of care is palliative.She is full code now  7.CKD stage III -Continue to monitor.Stable Cr at 1.2-15.  PLAN: -I refilled Gabapentin 100mg  today, she will use as needed up to three times a day  -Labs reviewed and adequate to proceed with Gemcitabine today with low fluid volume, continue every 2 weeks.  -f/u in 2 weeks  -Lab, Flush, F/u and Gemcitabine in 4 weeks with PET or CT CAP w contrast a few days before   No problem-specific Assessment & Plan notes found for this encounter.   Orders Placed This Encounter  Procedures  . NM PET Image Restag (PS) Skull Base To Thigh    Standing Status:   Future    Standing Expiration Date:   05/15/2021    Order Specific Question:   If indicated for the ordered procedure, I authorize the administration of a radiopharmaceutical per Radiology protocol    Answer:   Yes    Order Specific Question:   Preferred imaging location?    Answer:   [facility]    Order Specific Question:   Release to patient    Answer:   Immediate   All questions were answered. The patient knows to call the clinic with any problems, questions or concerns. No barriers to learning was detected. The total time spent in the appointment was 30 minutes.     [name], MD 05/15/2020   I, Joslyn Devon, am acting as scribe for [name], MD.    I have reviewed the above documentation for accuracy and completeness, and I agree with the above.

## 2020-05-14 DIAGNOSIS — Z853 Personal history of malignant neoplasm of breast: Secondary | ICD-10-CM | POA: Insufficient documentation

## 2020-05-15 ENCOUNTER — Inpatient Hospital Stay: Payer: Medicare Other | Attending: Adult Health | Admitting: Hematology

## 2020-05-15 ENCOUNTER — Inpatient Hospital Stay: Payer: Medicare Other

## 2020-05-15 ENCOUNTER — Encounter: Payer: Self-pay | Admitting: Hematology

## 2020-05-15 ENCOUNTER — Telehealth: Payer: Self-pay | Admitting: Hematology

## 2020-05-15 ENCOUNTER — Ambulatory Visit: Payer: Medicare Other

## 2020-05-15 ENCOUNTER — Other Ambulatory Visit: Payer: Self-pay

## 2020-05-15 VITALS — BP 149/54 | HR 61 | Temp 97.1°F | Resp 15 | Ht 65.0 in | Wt 121.4 lb

## 2020-05-15 DIAGNOSIS — C786 Secondary malignant neoplasm of retroperitoneum and peritoneum: Secondary | ICD-10-CM | POA: Diagnosis not present

## 2020-05-15 DIAGNOSIS — C221 Intrahepatic bile duct carcinoma: Secondary | ICD-10-CM | POA: Diagnosis not present

## 2020-05-15 DIAGNOSIS — T451X5A Adverse effect of antineoplastic and immunosuppressive drugs, initial encounter: Secondary | ICD-10-CM

## 2020-05-15 DIAGNOSIS — I10 Essential (primary) hypertension: Secondary | ICD-10-CM

## 2020-05-15 DIAGNOSIS — Z853 Personal history of malignant neoplasm of breast: Secondary | ICD-10-CM

## 2020-05-15 DIAGNOSIS — E1122 Type 2 diabetes mellitus with diabetic chronic kidney disease: Secondary | ICD-10-CM

## 2020-05-15 DIAGNOSIS — G620 Drug-induced polyneuropathy: Secondary | ICD-10-CM | POA: Diagnosis not present

## 2020-05-15 DIAGNOSIS — Z5111 Encounter for antineoplastic chemotherapy: Secondary | ICD-10-CM | POA: Diagnosis not present

## 2020-05-15 DIAGNOSIS — I248 Other forms of acute ischemic heart disease: Secondary | ICD-10-CM

## 2020-05-15 DIAGNOSIS — D6481 Anemia due to antineoplastic chemotherapy: Secondary | ICD-10-CM | POA: Diagnosis not present

## 2020-05-15 DIAGNOSIS — N1832 Chronic kidney disease, stage 3b: Secondary | ICD-10-CM

## 2020-05-15 DIAGNOSIS — D649 Anemia, unspecified: Secondary | ICD-10-CM

## 2020-05-15 DIAGNOSIS — Z95828 Presence of other vascular implants and grafts: Secondary | ICD-10-CM

## 2020-05-15 LAB — CMP (CANCER CENTER ONLY)
ALT: 11 U/L (ref 0–44)
AST: 19 U/L (ref 15–41)
Albumin: 3.5 g/dL (ref 3.5–5.0)
Alkaline Phosphatase: 51 U/L (ref 38–126)
Anion gap: 7 (ref 5–15)
BUN: 43 mg/dL — ABNORMAL HIGH (ref 8–23)
CO2: 25 mmol/L (ref 22–32)
Calcium: 8.9 mg/dL (ref 8.9–10.3)
Chloride: 108 mmol/L (ref 98–111)
Creatinine: 1.56 mg/dL — ABNORMAL HIGH (ref 0.44–1.00)
GFR, Estimated: 33 mL/min — ABNORMAL LOW (ref >60)
Glucose, Bld: 85 mg/dL (ref 70–99)
Potassium: 4.7 mmol/L (ref 3.5–5.1)
Sodium: 140 mmol/L (ref 135–145)
Total Bilirubin: <0.2 mg/dL — ABNORMAL LOW (ref 0.3–1.2)
Total Protein: 7 g/dL (ref 6.5–8.1)

## 2020-05-15 LAB — CBC WITH DIFFERENTIAL (CANCER CENTER ONLY)
Abs Immature Granulocytes: 0.02 10*3/uL (ref 0.00–0.07)
Basophils Absolute: 0 10*3/uL (ref 0.0–0.1)
Basophils Relative: 0 %
Eosinophils Absolute: 0.1 10*3/uL (ref 0.0–0.5)
Eosinophils Relative: 2 %
HCT: 27.4 % — ABNORMAL LOW (ref 36.0–46.0)
Hemoglobin: 8.7 g/dL — ABNORMAL LOW (ref 12.0–15.0)
Immature Granulocytes: 0 %
Lymphocytes Relative: 25 %
Lymphs Abs: 1.4 10*3/uL (ref 0.7–4.0)
MCH: 30.9 pg (ref 26.0–34.0)
MCHC: 31.8 g/dL (ref 30.0–36.0)
MCV: 97.2 fL (ref 80.0–100.0)
Monocytes Absolute: 0.9 10*3/uL (ref 0.1–1.0)
Monocytes Relative: 17 %
Neutro Abs: 3.1 10*3/uL (ref 1.7–7.7)
Neutrophils Relative %: 56 %
Platelet Count: 172 10*3/uL (ref 150–400)
RBC: 2.82 MIL/uL — ABNORMAL LOW (ref 3.87–5.11)
RDW: 17.2 % — ABNORMAL HIGH (ref 11.5–15.5)
WBC Count: 5.6 10*3/uL (ref 4.0–10.5)
nRBC: 0 % (ref 0.0–0.2)

## 2020-05-15 LAB — SAMPLE TO BLOOD BANK

## 2020-05-15 MED ORDER — GABAPENTIN 100 MG PO CAPS: 100.0000 mg | ORAL_CAPSULE | Freq: Three times a day (TID) | ORAL | 2 refills | Status: AC | PRN

## 2020-05-15 MED ORDER — SODIUM CHLORIDE 0.9% FLUSH
10.0000 mL | Freq: Once | INTRAVENOUS | Status: AC
  Administered 2020-05-15: 10 mL
  Filled 2020-05-15: qty 10

## 2020-05-15 MED ORDER — SODIUM CHLORIDE 0.9% FLUSH
10.0000 mL | INTRAVENOUS | Status: DC | PRN
  Administered 2020-05-15: 10 mL
  Filled 2020-05-15: qty 10

## 2020-05-15 MED ORDER — PROCHLORPERAZINE MALEATE 10 MG PO TABS
ORAL_TABLET | ORAL | Status: AC
  Filled 2020-05-15: qty 1

## 2020-05-15 MED ORDER — SODIUM CHLORIDE 0.9 % IV SOLN
Freq: Once | INTRAVENOUS | Status: AC
  Filled 2020-05-15: qty 250

## 2020-05-15 MED ORDER — HEPARIN SOD (PORK) LOCK FLUSH 100 UNIT/ML IV SOLN
500.0000 [IU] | Freq: Once | INTRAVENOUS | Status: AC | PRN
  Administered 2020-05-15: 500 [IU]
  Filled 2020-05-15: qty 5

## 2020-05-15 MED ORDER — SODIUM CHLORIDE 0.9 % IV SOLN
600.0000 mg/m2 | Freq: Once | INTRAVENOUS | Status: AC
  Administered 2020-05-15: 988 mg via INTRAVENOUS
  Filled 2020-05-15: qty 25.98

## 2020-05-15 MED ORDER — PROCHLORPERAZINE MALEATE 10 MG PO TABS
10.0000 mg | ORAL_TABLET | Freq: Once | ORAL | Status: AC
  Administered 2020-05-15: 10 mg via ORAL

## 2020-05-15 NOTE — Progress Notes (Signed)
Per Dr Burr Medico ok to treat with creatinine of 1.56

## 2020-05-15 NOTE — Telephone Encounter (Signed)
Left message with follow-up appointment per 3/2 los. Gave option to call back to reschedule if needed. 

## 2020-05-15 NOTE — Patient Instructions (Signed)
[facility] Discharge Instructions for Patients Receiving Chemotherapy  Today you received the following chemotherapy agents: Gemcitabine (Gemzar)  To help prevent nausea and vomiting after your treatment, we encourage you to take your nausea medication as prescribed.    If you develop nausea and vomiting that is not controlled by your nausea medication, call the clinic.   BELOW ARE SYMPTOMS THAT SHOULD BE REPORTED IMMEDIATELY:  *FEVER GREATER THAN 100.5 F  *CHILLS WITH OR WITHOUT FEVER  NAUSEA AND VOMITING THAT IS NOT CONTROLLED WITH YOUR NAUSEA MEDICATION  *UNUSUAL SHORTNESS OF BREATH  *UNUSUAL BRUISING OR BLEEDING  TENDERNESS IN MOUTH AND THROAT WITH OR WITHOUT PRESENCE OF ULCERS  *URINARY PROBLEMS  *BOWEL PROBLEMS  UNUSUAL RASH Items with * indicate a potential emergency and should be followed up as soon as possible.  Feel free to call the clinic should you have any questions or concerns. The clinic phone number is [phone].  Please show the CHEMO ALERT CARD at check-in to the Emergency Department and triage nurse.   

## 2020-05-22 ENCOUNTER — Other Ambulatory Visit: Payer: Self-pay

## 2020-05-22 ENCOUNTER — Other Ambulatory Visit: Payer: Self-pay | Admitting: *Deleted

## 2020-05-22 NOTE — Patient Outreach (Signed)
Donna May) Care Management  05/22/2020  Donna May 1940/06/18 [id number]  Telephone outreach for case closure.  Goals Addressed            This Visit's Progress   . COMPLETED: Patient Stated       Will participate in phone calls with [facility] care manager, over the next 6 months. 01/10/20 Reinforced the importance of our calls to report in her progress or challenges. 03/27/20 Donna May has been happy and enthusiastic with her participation with [facility]. 04/24/20 Donna May called me today vs nurse calling her on scheduled day. She is doing well. Restarted chemo and tolerating it well without any new complications. She will have a palliative care nurse visit next Monday 04/29/20. 05/22/20 Had in home palliative care visit and is fully on their services now. Will close pt care.    . COMPLETED: Track and Manage Symptoms as evidenced by pt report to nurse on each call over the next 6 months.       Follow Up Date 05/23/20  Follow HF Action Plan (self assessment, daily wt, take meds, get some exercise, know what zone you are in Green, Yellow or Red) Call MD or nurse when in yellow zone to avoid hospitalization.    Why is this important?   You will be able to handle your symptoms better if you keep track of them.  Making some simple changes to your lifestyle will help.  Eating healthy is one thing you can do to take good care of yourself.    Notes: Has Disease management calendar and has started recording her wt. She is familiarizing herself with the HF action plan. 01/24/20 Had another hospitalization for HF, today first discharge conversation. Reviewed HF management principles, diet and need for primary care appt along with scheduled cardiology and oncology appts. Need to work on recognizing sxs and seeking help before needing to go to the [facility]. 02/22/20 Pt seems to be at dry wt 117-120#, No SOB nor edema. Creat 1.7, GFR 30. Stockings have been ordered and should  arrive this week. Reinforced pt to call NP or cardiologist with increasing sxs! 04/24/20 Stable HF, no exacerbating sxs, following Action Plan. No hospitalization since 01/17/21.  05/22/20 No hospitalizations. HF stable. Still being followed by oncology but not having txs. Now active with palliative care services and will close pt case as she is in good hands.      Assessment: HF Stable                        Oncology monitoring status routinely.                        Palliative Care in place.  Plan: Advised pt, officially closing her case. Assured her she is in good hands and to call the service for any problems she may have. Assured her she will be in my thoughts and prayers and let her know it has been my pleasure serving her. She acknowledged she has appreciated out conversations.  Donna May. Donna Neither, MSN, [facility] Gerontological Nurse Practitioner [place] [facility] Management [phone]

## 2020-05-24 NOTE — Progress Notes (Signed)
[place]   Telephone:([phone]) [phone] Fax:(336) [phone]   Clinic Follow up Note   Patient Care Team: [name], MD as PCP - General (Family Medicine) [name], [name], MD as PCP - Cardiology (Cardiology) [name], [name], MD as Consulting Physician (Cardiology) [name], MD as Consulting Physician (Obstetrics and Gynecology) [name], [name], NP as Nurse Practitioner (Hematology and Oncology) [facility] [name], MD as Consulting Physician (Hematology) [name], [name], MD as Consulting Physician (Gastroenterology) Virgina Evener, Dawn, RN (Inactive) as Oncology Nurse Navigator [name], MD as Consulting Physician (General Surgery)  Date of Service:  05/29/2020  CHIEF COMPLAINT: F/u ofcholangiocarcinomaof liver  SUMMARY OF ONCOLOGIC HISTORY: Oncology History Overview Note  Cancer Staging Intrahepatic cholangiocarcinoma ([place]) Staging form: Intrahepatic Bile Duct, AJCC 8th Edition - Clinical stage from 09/23/2018: Stage IB (cT1b, cN0, cM0) - Signed by [name], MD on 10/06/2018    Intrahepatic cholangiocarcinoma ([place])  09/07/2018 Imaging   CT Chest IMPRESSION: 1. New, enhancing mass involving segment 4 of the liver and fundus of gallbladder is concerning for malignancy. This may represent either metastatic disease from breast cancer or neoplasm primary to the liver or hepatic biliary tree. Further evaluation with contrast enhanced CT of the abdomen and pelvis is recommended. 2. No findings to suggest metastatic disease within the chest. 3.  Aortic Atherosclerosis (ICD10-I70.0). 4. Coronary artery calcifications.   09/13/2018 Pathology Results   Diagnosis Liver, needle/core biopsy - ADENOCARCINOMA. Microscopic Comment Immunohistochemistry for CK7 is positive. CK20, TTF1, CDX-2, GATA-3, PAX 8, Qualitative ER, p63 and CK5/6 are negative. The provided clinical history of remote mammary carcinoma is noted. Based on the  morphology and immunophenotype of the adenocarcinoma observed in this specimen, primary cholangiocarcinoma is favored. Clinical and radiologic correlation are  encouraged. Results reported to [employer] on 09/15/2018. Intradepartmental consultation (Dr. Vic Ripper).   09/13/2018 Initial Diagnosis   Cholangiocarcinoma ([place])   09/23/2018 Procedure   Colonoscopy by Dr. Havery Moros 09/23/18  IMPRESSION - Two 3 to 4 mm polyps in the ascending colon, removed with a cold snare. Resected and retrieved. - Five 3 to 5 mm polyps in the transverse colon, removed with a cold snare. Resected and retrieved. - One 5 mm polyp at the splenic flexure, removed with a cold snare. Resected and retrieved. - Three 3 to 5 mm polyps in the sigmoid colon, removed with a cold snare. Resected and retrieved. - The examination was otherwise normal. Upper Endopscy by Dr. Havery Moros 09/23/18  IMPRESSION - Esophagogastric landmarks identified. - 2 cm hiatal hernia. - Normal esophagus otherwise. - A single gastric polyp. Resected and retrieved. - Mild gastritis. Biopsied. - Normal duodenal bulb and second portion of the duodenum.   09/23/2018 Pathology Results   Diagnosis 09/23/18 1. Surgical [P], duodenum - BENIGN SMALL BOWEL MUCOSA. - NO ACTIVE INFLAMMATION OR VILLOUS ATROPHY IDENTIFIED. 2. Surgical [P], stomach, polyp - HYPERPLASTIC POLYP(S). - THERE IS NO EVIDENCE OF MALIGNANCY. 3. Surgical [P], gastric antrum and gastric body - CHRONIC INACTIVE GASTRITIS. - THERE IS NO EVIDENCE OF HELICOBACTER-PYLORI, DYSPLASIA, OR MALIGNANCY. - SEE COMMENT. 4. Surgical [P], colon, sigmoid, splenic flexure, transverse and ascending, polyp (9) - TUBULAR ADENOMA(S). - SESSILE SERRATED POLYP WITHOUT CYTOLOGIC DYSPLASIA. - HIGH GRADE DYSPLASIA IS NOT IDENTIFIED. 5. Surgical [P], colon, sigmoid, polyp (2) - HYPERPLASTIC POLYP(S). - THERE IS NO EVIDENCE OF MALIGNANCY.   09/23/2018 Cancer Staging   Staging form: Intrahepatic Bile  Duct, AJCC 8th Edition - Clinical stage from 09/23/2018: Stage IB (cT1b, cN0, cM0) - Signed  by [name], MD on 10/06/2018   09/29/2018 PET scan   PET 09/29/18 IMPRESSION: 1. Hypermetabolic mass in the RIGHT hepatic lobe consistent with biopsy proven adenocarcinoma. No additional liver metastasis. 2. No evidence of local breast cancer recurrence in the RIGHT breast or RIGHT axilla. 3. Mild bilateral hypermetabolic adrenal glands is favored benign hyperplasia. 4. No evidence of additional metastatic disease on skull base to thigh FDG PET scan.   11/06/2018 Genetic Testing   Negative genetic testing on the [facility] Common Hereditary Cancers panel. A variant of uncertain significance was identified in one of her APC genes, called c.1243G>A (p.Ala415Thr).  The Common Hereditary Cancers Panel offered by [facility] includes sequencing and/or deletion duplication testing of the following 48 genes: APC, ATM, AXIN2, BARD1, BMPR1A, BRCA1, BRCA2, BRIP1, CDH1, CDK4, CDKN2A (p14ARF), CDKN2A (p16INK4a), CHEK2, CTNNA1, DICER1, EPCAM (Deletion/duplication testing only), GREM1 (promoter region deletion/duplication testing only), KIT, MEN1, MLH1, MSH2, MSH3, MSH6, MUTYH, NBN, NF1, NHTL1, PALB2, PDGFRA, PMS2, POLD1, POLE, PTEN, RAD50, RAD51C, RAD51D, RNF43, SDHB, SDHC, SDHD, SMAD4, SMARCA4. STK11, TP53, TSC1, TSC2, and VHL.  The following genes were evaluated for sequence changes only: SDHA and HOXB13 c.251G>A variant only.    11/17/2018 Pathology Results   Diagnosis 11/17/18 1. Soft tissue, biopsy, Diaphragmatic nodules - METASTATIC ADENOCARCINOMA, CONSISTENT WITH PATIENT'S CLINICAL HISTORY OF CHOLANGIOCARCINOMA. SEE NOTE 2. Liver, biopsy, Left - LIVER PARENCHYMA WITH A BENIGN FIBROTIC NODULE - NO EVIDENCE OF MALIGNANCY 3. Stomach, biopsy - BENIGN PAPILLARY MESOTHELIAL HYPERPLASIA - NO EVIDENCE OF MALIGNANCY   11/29/2018 Imaging   CT CAP WO Contrast  IMPRESSION: 1. Dominant liver mass appears grossly stable from  09/29/2018. Additional liver lesions are too small to characterize but were not shown to be hypermetabolic on PET. 2. Mild nodularity of both adrenal glands with associated hypermetabolism on 09/29/2018. Continued attention on follow-up exams is warranted. 3. Small right lower lobe nodules, stable from 09/07/2018. Again, attention on follow-up is recommended. 4. Trace bilateral pleural fluid. 5. Aortic atherosclerosis (ICD10-170.0). Coronary artery calcification. 6. Enlarged pulmonic trunk, indicative of pulmonary arterial hypertension.     11/30/2018 - 06/14/2019 Chemotherapy   First line chemo Oxaliplatin and gemcitabine q2weeks starting 11/30/18. Stopped Oxaliplatin on 05/03/19 due to worsening neuropathy. Added Cisplatin with C13 on 05/17/19 and stopped on C15 06/14/19 due to neuropathy. Reduced to maintenance single agent Gemcitabine on 06/28/19.    01/20/2019 Imaging   CT CAP IMPRESSION: restaging  1. Mild interval increase in size of dominant liver mass involving segment 4 and segment 5. 2. No significant or progressive adrenal nodularity identified to suggest metastatic disease. 3. Unchanged appearance of small right lower lobe and lingular lung nodules, nonspecific.   03/21/2019 PET scan   IMPRESSION: 1. Large right hepatic mass with SUV uptake near background hepatic activity, dramatic response to therapy, also with decrease in size when compared to the prior study. 2. Signs of prior right mastectomy and axillary dissection as before. 3. Left adrenal activity in no longer above the level of activity seen in the contralateral, right adrenal gland or in the liver. Attention on follow-up. 4. No new signs of disease.   06/27/2019 PET scan   IMPRESSION: 1. Further decrease in size of a dominant hepatic mass which is non FDG avid. 2. No evidence of metastatic disease. 3. No evidence of left adrenal hypermetabolism or mass. 4. Incidental findings, including: Uterine fibroids.  Coronary artery atherosclerosis. Aortic Atherosclerosis (ICD10-I70.0). Pulmonary artery enlargement suggests pulmonary arterial hypertension.   06/28/2019 -  Chemotherapy   Maintenance single  agent Gemcitabine q2weeks starting on 06/28/19 with C16.  ------On chemo break since 12/27/19.  ------She restarted Single Agent Gemcitabine q2weeks on 04/03/20 due to mild disease progression in liver on 02/2020 PET    09/15/2019 PET scan   IMPRESSION: 1. In the region of regional tumor at the junction of the right and left hepatic lobe, there is continued hypodensity and overall activity slightly less than that of the surrounding normal liver, indicating effective response to therapy. No current worrisome hypermetabolic lesion is identified in the liver or elsewhere. 2. Chronic asymmetric edema in the subcutaneous tissues of the right upper extremity, nonspecific. The patient has had a prior right mastectomy and right axillary dissection. 3. Other imaging findings of potential clinical significance: Aortic Atherosclerosis (ICD10-I70.0). Coronary atherosclerosis. Mild cardiomegaly. Small right and trace left pleural effusions. Subcutaneous and mild mesenteric edema. Suspected uterine fibroids.   12/18/2019 PET scan   IMPRESSION: 1. No significant abnormal activity to suggest active/recurrent malignancy. 2. Lingual tonsillar activity is mildly increased from prior but probably incidental/physiologic, attention on follow up suggested. 3. Other imaging findings of potential clinical significance: Third spacing of fluid with trace ascites, mesenteric edema, subcutaneous edema, and possibly subtle pulmonary edema. Mild cardiomegaly. Aortic Atherosclerosis (ICD10-I70.0). Coronary atherosclerosis. Right ovarian dermoid. Calcified uterine fibroids. Renal cysts. Right glenohumeral arthropathy. Mild to moderate scoliosis. Suspected pulmonary arterial hypertension.   03/14/2020 PET scan   IMPRESSION: 1.  New small focus of hypermetabolism in segment 4A of the liver worrisome for new metastatic focus. 2. No other findings for metastatic disease involving the neck, chest, abdomen or pelvis.        CURRENT THERAPY:  Maintenance single agent Gemcitabine q2weeks starting on 06/28/19 with C16.On chemo break since 12/27/19.She restarted Single Agent Gemcitabine q2weeks on 04/03/20 due to mild disease progression.  INTERVAL HISTORY:  Donna May is here for a follow up. She presents to the clinic alone. She notes she is sleepy because she gets up very earlier on appointment days. She denies any new changes. She notes she is eating better the last few days with heartier meals. She has gained weight. She notes her breathing is adequate. She notes yesterday having an episode of lower chest/upper abdominal pain while was walking through store. She can lay flat and breath adequately. I reviewed her medication list with her.     REVIEW OF SYSTEMS:   Constitutional: Denies fevers, chills or abnormal weight loss Eyes: Denies blurriness of vision Ears, nose, mouth, throat, and face: Denies mucositis or sore throat Respiratory: Denies cough, dyspnea or wheezes Cardiovascular: Denies palpitation, chest discomfort or lower extremity swelling Gastrointestinal:  Denies nausea, heartburn or change in bowel habits Skin: Denies abnormal skin rashes Lymphatics: Denies new lymphadenopathy or easy bruising Neurological:Denies numbness, tingling or new weaknesses Behavioral/Psych: Mood is stable, no new changes  All other systems were reviewed with the patient and are negative.  MEDICAL HISTORY:  Past Medical History:  Diagnosis Date  . Anemia   . Anxiety   . Arthritis   . Breast cancer ([place]) 1992  . Cataract    Bilateral eyes - surgery to remove  . Chronic diastolic CHF (congestive heart failure) ([place])   . Chronic kidney disease    CKD stage 3  . Complication of anesthesia    "something they use  make me itch for a couple of days."  . Depression   . Diabetes mellitus    type 2  . Duodenitis 01/18/2002  . Fainting spell   . GERD (  gastroesophageal reflux disease)   . Heart murmur    never has caused any problems  . Hiatal hernia 08/08/2008, 01/18/2002  . History of pneumonia    x 2  . Hyperlipidemia   . Hypertension   . Liver cancer ([facility]) 08/2018  . Lymphedema 2017   Right arm    SURGICAL HISTORY: Past Surgical History:  Procedure Laterality Date  . AXILLARY SURGERY     cyst removal, right  . COLONOSCOPY  09/23/2018   Dr. Adela Lank - polyps  . EYE SURGERY Bilateral    cataracts to remove  . LAPAROSCOPY N/A 11/17/2018   Procedure: LAPAROSCOPY DIAGNOSTIC, INTRAOPERATIVE ULTRASOUND, PERITONEAL BIOPSIES;  Surgeon: Almond Lint, MD;  Location: [facility] OR;  Service: General;  Laterality: N/A;  GENERAL AND EPIDURAL  . LIVER BIOPSY  08/2018   Dr. Rip Harbour  . MASTECTOMY  1992   right, with flap  . NM MYOCAR PERF WALL MOTION  06/11/2009   Protocol:[place], post stress EF58%, EKG negative for ischemia, low risk  . PORTACATH PLACEMENT N/A 12/02/2018   Procedure: INSERTION PORT-A-CATH;  Surgeon: Almond Lint, MD;  Location: [facility] OR;  Service: General;  Laterality: N/A;  . RECONSTRUCTION BREAST W/ TRAM FLAP Right   . TONSILLECTOMY    . TRANSTHORACIC ECHOCARDIOGRAM  12/24/2009   LVEF =>55%, normal study  . UPPER GI ENDOSCOPY      I have reviewed the social history and family history with the patient and they are unchanged from previous note.  ALLERGIES:  has No Known Allergies.  MEDICATIONS:  Current Outpatient Medications  Medication Sig Dispense Refill  . amLODipine (NORVASC) 10 MG tablet Take 1 tablet (10 mg total) by mouth daily. 90 tablet 3  . aspirin 81 MG tablet Take 81 mg by mouth daily.     . carvedilol (COREG) 25 MG tablet Take 1 tablet (25 mg total) by mouth 2 (two) times daily with a meal. 180 tablet 3  . furosemide (LASIX) 40 MG tablet Take 1 tablet (40 mg total) by  mouth daily. 30 tablet 11  . gabapentin (NEURONTIN) 100 MG capsule Take 1 capsule (100 mg total) by mouth every 8 (eight) hours as needed. 180 capsule 2  . glucose blood test strip 1 each by Other route 2 (two) times daily. Use Onetouch verio test strips as instructed to check blood sugar twice daily. 100 each 2  . hydrALAZINE (APRESOLINE) 100 MG tablet TAKE THREE TIMES DAILY-AM, MIDDLE OF THE DAY AND IN THE EVENING. 90 tablet 3  . KLOR-CON M10 10 MEQ tablet TAKE 1 TABLET BY MOUTH EVERY DAY 90 tablet 1  . lansoprazole (PREVACID) 15 MG capsule Take 1 capsule (15 mg total) by mouth daily. 90 capsule 1  . loratadine (CLARITIN) 10 MG tablet Take 10 mg by mouth daily as needed for allergies.    . Omega 3 1000 MG CAPS Take 2,000 mg by mouth daily.     . rosuvastatin (CRESTOR) 20 MG tablet Take 1 tablet (20 mg total) by mouth daily. 90 tablet 3  . sitaGLIPtin (JANUVIA) 100 MG tablet Take 1 tablet (100 mg total) by mouth daily. 90 tablet 3  . valsartan (DIOVAN) 320 MG tablet Take 1 tablet (320 mg total) by mouth daily. 90 tablet 3   Current Facility-Administered Medications  Medication Dose Route Frequency Provider Last Rate Last Admin  . triamcinolone acetonide (KENALOG) 10 MG/ML injection 10 mg  10 mg Other Once Alvan Dame, DPM       Facility-Administered Medications Ordered in Other  Visits  Medication Dose Route Frequency Provider Last Rate Last Admin  . gemcitabine (GEMZAR) 988 mg in sodium chloride 0.9 % 100 mL chemo infusion  600 mg/m2 (Treatment Plan Recorded) Intravenous Once [name], MD 252 mL/hr at 05/29/20 1117 988 mg at 05/29/20 1117    PHYSICAL EXAMINATION: ECOG PERFORMANCE STATUS: 2 - Symptomatic, <50% confined to bed  Vitals:   05/29/20 0953  BP: (!) 152/64  Pulse: 66  Resp: 14  Temp: 97.8 F (36.6 C)  SpO2: 100%   Filed Weights   05/29/20 0953  Weight: 127 lb (57.6 kg)    Due to COVID19 we will limit examination to appearance. Patient had no complaints.   GENERAL:alert, no distress and comfortable SKIN: skin color normal, no rashes or significant lesions EYES: normal, Conjunctiva are pink and non-injected, sclera clear  NEURO: alert & oriented x 3 with fluent speech   LABORATORY DATA:  I have reviewed the data as listed CBC Latest Ref Rng & Units 05/29/2020 05/15/2020 05/01/2020  WBC 4.0 - 10.5 K/uL 5.7 5.6 5.1  Hemoglobin 12.0 - 15.0 g/dL 8.1(L) 8.7(L) 8.4(L)  Hematocrit 36.0 - 46.0 % 24.9(L) 27.4(L) 26.6(L)  Platelets 150 - 400 K/uL 156 172 138(L)     CMP Latest Ref Rng & Units 05/29/2020 05/15/2020 05/01/2020  Glucose 70 - 99 mg/dL 90 85 96  BUN 8 - 23 mg/dL 38(H) 43(H) 44(H)  Creatinine 0.44 - 1.00 mg/dL 1.59(H) 1.56(H) 1.87(H)  Sodium 135 - 145 mmol/L 141 140 141  Potassium 3.5 - 5.1 mmol/L 4.6 4.7 4.9  Chloride 98 - 111 mmol/L 109 108 111  CO2 22 - 32 mmol/L $RemoveB'25 25 24  'bSfpynrp$ Calcium 8.9 - 10.3 mg/dL 8.5(L) 8.9 8.7(L)  Total Protein 6.5 - 8.1 g/dL 6.7 7.0 6.9  Total Bilirubin 0.3 - 1.2 mg/dL 0.2(L) <0.2(L) <0.2(L)  Alkaline Phos 38 - 126 U/L 54 51 54  AST 15 - 41 U/L $Remo'19 19 15  'IXAHo$ ALT 0 - 44 U/L $Remo'12 11 9      'DjCLG$ RADIOGRAPHIC STUDIES: I have personally reviewed the radiological images as listed and agreed with the findings in the report. No results found.   ASSESSMENT & PLAN:  BRIDGID PRINTZ is a 80 y.o. female with    1.Intrahepatic cholangiocarcinoma, cT1N0M1,with peritoneal metastasis, MSS, IDH1 mutation (+) -She was diagnosed in 08/2018. Her biopsy of her liver mass shows adenocarcinoma,most consistent withcholangiocarcinoma.Her EGD/Colonoscopy from 7/10/20was negativeand 09/29/18 PET scanshowedno evidence of distant metastasis. -She was brought to OR on 11/17/18,unfortunately she was found to have peritoneal metastasis to right diaphragm and surgery was aborted. -Given her metastatic cancer, Istarted her onsystemicchemo withFirst line chemoOxaliplatinand gemcitabine q2weeksto control her diseasebeginningon 11/30/18.Due to  poor tolerance to Oxaliplatin and Cisplatin from neuropathy, she proceeded withsingle agent Gemcitabineevery 2 weeksstarting 06/28/19 with C16.She was on chemo break since10/2021. -GivenPET from 03/14/20 showedconcern for new liver lesion, I restarted her onmaintenance Gemcitabine q2 weeksbeginning1/19/22. -She is tolerating chemo well overall. Her neuropathy mostly stable. Labs reviewed, Hg 8.1, Cr 1.59, Ca 8.5, albumin 3.4. Overall adequate to proceed with Gemcitabine at same dose.  -F/u in 2 weeks. Plan to scan her before next visit   2. Anemia,Secondary to chemoand cancer -Shepreviouslyrequiredblood transfusionsince 03/2019, continue as needed. Will continue prenatal vitamin. -Labs blood transfusion10/25/21 -Hg has been stable in 9 rangeoff chemo. With restart of chemo, Hg has dropped to 8 range. She is not much symptomatic -Hg 8.1 today. Given she is not very symptomatic, will repeat lab next week. If she is  symptomatic or labs drop below 8, will give blood transfusion.   3.Peripheral neuropathy, G3, Secondary to Oxaliplatinand Cisplatin (both d/c) -Due to low tolerance and not much help, she stopped Lyrica,Gabapentinand Cymbalta. -She waspreviouslyseen by Dr Mickeal Skinner.She is now being seen byChiropractor Dr Page Spiro. -She remains to have decreased function overall.Stablewith occasional flares from cold weather.  -Will monitor with restart of Gemcitabine.Stable so far. -Continue Gabapentin 100mg  to use at night as needed  4. H/o right breast cancer, Geneticsnegative, Osteopenia -s/prightmastectomy in 1992, per patient did not require adjuvant therapy. She was last seen by Dr. Jana Hakim in 2017. -Her4/2019 DEXA shows mild osteopenia. F/u with Gyn about continuing Raloxifene. I do not think she has to continue at this point.  5.Comorbidities:DM,HTN,hiatal hernia, GERD, renal artery stenosis, 2021 pulmonary edema -Continue medications  and f/u with PCP and cardiologist for management. -She is on torsemide twice a week, and hydrochlorothiazide daily  6. Family support, Goal of Care Discussion -She has a son and daughterwho are often busy but help as needed. -The patient understands the goal of care is palliative.She is full code now  7.CKD stage III -Continue to monitor.Stable Cr at 1.2-15.  PLAN: -Labs reviewed and adequate to proceedwithGemcitabine today with low fluid volume, continue every 2 weeks.  -Lab and 3hr blood transfusion next week  -Lab, Flush, F/u and Gemcitabine in 2 weeks with PET or CT CAP w contrast a few days before   No problem-specific Assessment & Plan notes found for this encounter.   No orders of the defined types were placed in this encounter.  All questions were answered. The patient knows to call the clinic with any problems, questions or concerns. No barriers to learning was detected.      [name], MD 05/29/2020   I, Joslyn Devon, am acting as scribe for [name], MD.   I have reviewed the above documentation for accuracy and completeness, and I agree with the above.

## 2020-05-27 ENCOUNTER — Telehealth: Payer: Self-pay

## 2020-05-27 NOTE — Telephone Encounter (Signed)
12:32PM: Palliative care SW outreached patient/family for monthly telephonic visit.  SW left HIPPA complaint VM. Awaiting return call.  Will continue to offer palliative care support.

## 2020-05-29 ENCOUNTER — Inpatient Hospital Stay: Payer: Medicare Other

## 2020-05-29 ENCOUNTER — Inpatient Hospital Stay (HOSPITAL_BASED_OUTPATIENT_CLINIC_OR_DEPARTMENT_OTHER): Payer: Medicare Other | Admitting: Hematology

## 2020-05-29 ENCOUNTER — Other Ambulatory Visit: Payer: Self-pay

## 2020-05-29 ENCOUNTER — Encounter: Payer: Self-pay | Admitting: Hematology

## 2020-05-29 VITALS — BP 152/64 | HR 66 | Temp 97.8°F | Resp 14 | Ht 65.0 in | Wt 127.0 lb

## 2020-05-29 DIAGNOSIS — C221 Intrahepatic bile duct carcinoma: Secondary | ICD-10-CM | POA: Diagnosis not present

## 2020-05-29 DIAGNOSIS — I248 Other forms of acute ischemic heart disease: Secondary | ICD-10-CM | POA: Diagnosis not present

## 2020-05-29 DIAGNOSIS — C786 Secondary malignant neoplasm of retroperitoneum and peritoneum: Secondary | ICD-10-CM | POA: Diagnosis not present

## 2020-05-29 DIAGNOSIS — D649 Anemia, unspecified: Secondary | ICD-10-CM

## 2020-05-29 DIAGNOSIS — T451X5A Adverse effect of antineoplastic and immunosuppressive drugs, initial encounter: Secondary | ICD-10-CM

## 2020-05-29 DIAGNOSIS — G620 Drug-induced polyneuropathy: Secondary | ICD-10-CM | POA: Diagnosis not present

## 2020-05-29 DIAGNOSIS — Z95828 Presence of other vascular implants and grafts: Secondary | ICD-10-CM

## 2020-05-29 DIAGNOSIS — D6481 Anemia due to antineoplastic chemotherapy: Secondary | ICD-10-CM | POA: Diagnosis not present

## 2020-05-29 DIAGNOSIS — Z5111 Encounter for antineoplastic chemotherapy: Secondary | ICD-10-CM | POA: Diagnosis not present

## 2020-05-29 LAB — CMP (CANCER CENTER ONLY)
ALT: 12 U/L (ref 0–44)
AST: 19 U/L (ref 15–41)
Albumin: 3.4 g/dL — ABNORMAL LOW (ref 3.5–5.0)
Alkaline Phosphatase: 54 U/L (ref 38–126)
Anion gap: 7 (ref 5–15)
BUN: 38 mg/dL — ABNORMAL HIGH (ref 8–23)
CO2: 25 mmol/L (ref 22–32)
Calcium: 8.5 mg/dL — ABNORMAL LOW (ref 8.9–10.3)
Chloride: 109 mmol/L (ref 98–111)
Creatinine: 1.59 mg/dL — ABNORMAL HIGH (ref 0.44–1.00)
GFR, Estimated: 32 mL/min — ABNORMAL LOW (ref >60)
Glucose, Bld: 90 mg/dL (ref 70–99)
Potassium: 4.6 mmol/L (ref 3.5–5.1)
Sodium: 141 mmol/L (ref 135–145)
Total Bilirubin: 0.2 mg/dL — ABNORMAL LOW (ref 0.3–1.2)
Total Protein: 6.7 g/dL (ref 6.5–8.1)

## 2020-05-29 LAB — CBC WITH DIFFERENTIAL (CANCER CENTER ONLY)
Abs Immature Granulocytes: 0.01 10*3/uL (ref 0.00–0.07)
Basophils Absolute: 0 10*3/uL (ref 0.0–0.1)
Basophils Relative: 0 %
Eosinophils Absolute: 0.1 10*3/uL (ref 0.0–0.5)
Eosinophils Relative: 3 %
HCT: 24.9 % — ABNORMAL LOW (ref 36.0–46.0)
Hemoglobin: 8.1 g/dL — ABNORMAL LOW (ref 12.0–15.0)
Immature Granulocytes: 0 %
Lymphocytes Relative: 25 %
Lymphs Abs: 1.4 10*3/uL (ref 0.7–4.0)
MCH: 31.9 pg (ref 26.0–34.0)
MCHC: 32.5 g/dL (ref 30.0–36.0)
MCV: 98 fL (ref 80.0–100.0)
Monocytes Absolute: 0.9 10*3/uL (ref 0.1–1.0)
Monocytes Relative: 15 %
Neutro Abs: 3.2 10*3/uL (ref 1.7–7.7)
Neutrophils Relative %: 57 %
Platelet Count: 156 10*3/uL (ref 150–400)
RBC: 2.54 MIL/uL — ABNORMAL LOW (ref 3.87–5.11)
RDW: 17.6 % — ABNORMAL HIGH (ref 11.5–15.5)
WBC Count: 5.7 10*3/uL (ref 4.0–10.5)
nRBC: 0 % (ref 0.0–0.2)

## 2020-05-29 LAB — SAMPLE TO BLOOD BANK

## 2020-05-29 MED ORDER — HEPARIN SOD (PORK) LOCK FLUSH 100 UNIT/ML IV SOLN
500.0000 [IU] | Freq: Once | INTRAVENOUS | Status: AC | PRN
  Administered 2020-05-29: 500 [IU]
  Filled 2020-05-29: qty 5

## 2020-05-29 MED ORDER — SODIUM CHLORIDE 0.9 % IV SOLN
600.0000 mg/m2 | Freq: Once | INTRAVENOUS | Status: AC
  Administered 2020-05-29: 988 mg via INTRAVENOUS
  Filled 2020-05-29: qty 25.98

## 2020-05-29 MED ORDER — PROCHLORPERAZINE MALEATE 10 MG PO TABS
10.0000 mg | ORAL_TABLET | Freq: Once | ORAL | Status: AC
Start: 2020-05-29 — End: 2020-05-29
  Administered 2020-05-29: 10 mg via ORAL

## 2020-05-29 MED ORDER — SODIUM CHLORIDE 0.9 % IV SOLN
Freq: Once | INTRAVENOUS | Status: AC
Start: 2020-05-29 — End: 2020-05-29
  Filled 2020-05-29: qty 250

## 2020-05-29 MED ORDER — PROCHLORPERAZINE MALEATE 10 MG PO TABS
ORAL_TABLET | ORAL | Status: AC
  Filled 2020-05-29: qty 1

## 2020-05-29 MED ORDER — SODIUM CHLORIDE 0.9% FLUSH
10.0000 mL | Freq: Once | INTRAVENOUS | Status: AC
  Administered 2020-05-29: 10 mL
  Filled 2020-05-29: qty 10

## 2020-05-29 MED ORDER — SODIUM CHLORIDE 0.9% FLUSH
10.0000 mL | INTRAVENOUS | Status: DC | PRN
  Administered 2020-05-29: 10 mL
  Filled 2020-05-29: qty 10

## 2020-05-29 NOTE — Patient Instructions (Signed)
[facility] Discharge Instructions for Patients Receiving Chemotherapy  Today you received the following chemotherapy agents: gemcitabine.  To help prevent nausea and vomiting after your treatment, we encourage you to take your nausea medication as directed.   If you develop nausea and vomiting that is not controlled by your nausea medication, call the clinic.   BELOW ARE SYMPTOMS THAT SHOULD BE REPORTED IMMEDIATELY:  *FEVER GREATER THAN 100.5 F  *CHILLS WITH OR WITHOUT FEVER  NAUSEA AND VOMITING THAT IS NOT CONTROLLED WITH YOUR NAUSEA MEDICATION  *UNUSUAL SHORTNESS OF BREATH  *UNUSUAL BRUISING OR BLEEDING  TENDERNESS IN MOUTH AND THROAT WITH OR WITHOUT PRESENCE OF ULCERS  *URINARY PROBLEMS  *BOWEL PROBLEMS  UNUSUAL RASH Items with * indicate a potential emergency and should be followed up as soon as possible.  Feel free to call the clinic should you have any questions or concerns. The clinic phone number is [phone].  Please show the CHEMO ALERT CARD at check-in to the Emergency Department and triage nurse.   

## 2020-05-29 NOTE — Progress Notes (Signed)
I reviewed PET appt and instructions with Donna May.  She verbalized understanding

## 2020-05-29 NOTE — Progress Notes (Signed)
Per MD Burr Medico, ok to treat with Creatinine today.

## 2020-05-30 ENCOUNTER — Other Ambulatory Visit: Payer: Self-pay | Admitting: Hematology

## 2020-05-30 DIAGNOSIS — C221 Intrahepatic bile duct carcinoma: Secondary | ICD-10-CM

## 2020-05-31 ENCOUNTER — Telehealth: Payer: Self-pay | Admitting: Hematology

## 2020-05-31 ENCOUNTER — Other Ambulatory Visit: Payer: Self-pay

## 2020-05-31 ENCOUNTER — Encounter: Payer: Self-pay | Admitting: Cardiovascular Disease

## 2020-05-31 ENCOUNTER — Ambulatory Visit (INDEPENDENT_AMBULATORY_CARE_PROVIDER_SITE_OTHER): Payer: Medicare Other | Admitting: Cardiovascular Disease

## 2020-05-31 VITALS — BP 139/52 | HR 77 | Ht 65.0 in | Wt 128.2 lb

## 2020-05-31 DIAGNOSIS — I2584 Coronary atherosclerosis due to calcified coronary lesion: Secondary | ICD-10-CM | POA: Diagnosis not present

## 2020-05-31 DIAGNOSIS — C221 Intrahepatic bile duct carcinoma: Secondary | ICD-10-CM | POA: Diagnosis not present

## 2020-05-31 DIAGNOSIS — E1122 Type 2 diabetes mellitus with diabetic chronic kidney disease: Secondary | ICD-10-CM | POA: Diagnosis not present

## 2020-05-31 DIAGNOSIS — E7800 Pure hypercholesterolemia, unspecified: Secondary | ICD-10-CM | POA: Diagnosis not present

## 2020-05-31 DIAGNOSIS — I700 Atherosclerosis of aorta: Secondary | ICD-10-CM | POA: Diagnosis not present

## 2020-05-31 DIAGNOSIS — I248 Other forms of acute ischemic heart disease: Secondary | ICD-10-CM

## 2020-05-31 DIAGNOSIS — C787 Secondary malignant neoplasm of liver and intrahepatic bile duct: Secondary | ICD-10-CM | POA: Diagnosis not present

## 2020-05-31 DIAGNOSIS — N1832 Chronic kidney disease, stage 3b: Secondary | ICD-10-CM | POA: Diagnosis not present

## 2020-05-31 DIAGNOSIS — I2510 Atherosclerotic heart disease of native coronary artery without angina pectoris: Secondary | ICD-10-CM | POA: Diagnosis not present

## 2020-05-31 DIAGNOSIS — I5032 Chronic diastolic (congestive) heart failure: Secondary | ICD-10-CM

## 2020-05-31 DIAGNOSIS — I10 Essential (primary) hypertension: Secondary | ICD-10-CM | POA: Diagnosis not present

## 2020-05-31 NOTE — Telephone Encounter (Signed)
Scheduled follow-up appointment per 3/16 los. Patient is aware. 

## 2020-05-31 NOTE — Progress Notes (Signed)
Patient ID: Donna May, female   DOB: [birth year], 80 y.o.   MRN: [id number]    Cardiology Office Note    Date:  06/03/2020   ID:  Donna May, Donna May 1941-02-03, MRN [id number]  PCP:  [name], MD  Cardiologist:   Sanda Klein, MD   Chief Complaint  Patient presents with  . Congestive Heart Failure    History of Present Illness:  Donna May is a 80 y.o. female with chronic diastolic heart failure, long-standing systemic hypertension, PAD (nonobstructive bilateral renal artery disease), long-standing hyperlipidemia and type 2 diabetes mellitus with diabetic nephropathy.She quit smoking 27 years ago. She has a very strong family history of premature heart disease in both parents and one of her brothers.  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  She has a remote history of breast cancer and some residual left arm lymphedema following lymph node dissection.  She was hospitalized with acute pulmonary edema in October 2021 in the setting of severe anemia and marked hypertension.  She improved with treatment with diuretics and antibiotics, but was re-hospitalized in November 2021, once again with severe hypertension and hypoxemia.  Patient to diuretics, her dose of hydralazine was increased.  As then she is generally done well and has not had recurrent problems or shortness of breath.  She reports that at home her systolic blood pressure is typically in the 120s, although at her recent office visit it was 149/54.  Denies exertional angina or dyspnea, orthopnea, PND, lower extremity edema.  Tries to avoid sodium in her diet.  Complains of some neuropathic symptoms in her hands and feet, related to chemotherapy, but no focal neurological complaints otherwise.Marland Kitchen    Her echocardiogram in October 2021 showed that left ventricular systolic function was normal EF of 55-60%, there was moderate LVH and echo confirmed grade 2 diastolic dysfunction.  No major  valvular abnormalities were reported.  The myocardium was described as having a speckled appearance and amyloidosis was considered.  Note that the mitral annulus velocities were really not that low (lateral 8 cm/second, medial 4.5 cm/second).  She does not have low voltage on her EKG.    She had a low risk nuclear stress test in July 2018.  Her echocardiogram in 2014 showed normal left ventricular systolic function, mild left ventricular hypertrophy, mild pulmonary artery hypertension (37 mmHg).  She had no evidence of stenoses in the lower extremities on arterial duplex study performed in 2019.  Mild plaque without obstruction was seen on ultrasound of the renal arteries in 2016.  Aortic atherosclerosis is incidentally noted on multiple imaging studies performed for cholangiocarcinoma.  She is receiving periodic gemcitabine/platinum infusions for cholangiocarcinoma.  Appetite is mediocre and she has been losing some more weight.  The foods she enjoys "do not taste right".  However, she reports that she is eating "a lot".  She denies chest pain or shortness of breath at rest with activity, leg edema, orthopnea or PND, syncope or palpitations, claudication or focal neurological events.     Past Medical History:  Diagnosis Date  . Anemia   . Anxiety   . Arthritis   . Breast cancer ([place]) 1992  . Cataract    Bilateral eyes - surgery to remove  . Chronic diastolic CHF (congestive heart failure) ([place])   . Chronic kidney disease    CKD stage 3  . Complication of anesthesia    "something they use make me itch for a couple  of days."  . Depression   . Diabetes mellitus    type 2  . Duodenitis 01/18/2002  . Fainting spell   . GERD (gastroesophageal reflux disease)   . Heart murmur    never has caused any problems  . Hiatal hernia 08/08/2008, 01/18/2002  . History of pneumonia    x 2  . Hyperlipidemia   . Hypertension   . Liver cancer ([place]) 08/2018  . Lymphedema 2017   Right arm    Past  Surgical History:  Procedure Laterality Date  . AXILLARY SURGERY     cyst removal, right  . COLONOSCOPY  09/23/2018   Dr. Havery Moros - polyps  . EYE SURGERY Bilateral    cataracts to remove  . LAPAROSCOPY N/A 11/17/2018   Procedure: LAPAROSCOPY DIAGNOSTIC, INTRAOPERATIVE ULTRASOUND, PERITONEAL BIOPSIES;  Surgeon: [name], MD;  Location: [place];  Service: General;  Laterality: N/A;  GENERAL AND EPIDURAL  . LIVER BIOPSY  08/2018   Dr. Lindwood Coke  . MASTECTOMY  1992   right, with flap  . NM MYOCAR PERF WALL MOTION  06/11/2009   Protocol:[place], post stress EF58%, EKG negative for ischemia, low risk  . PORTACATH PLACEMENT N/A 12/02/2018   Procedure: INSERTION PORT-A-CATH;  Surgeon: [name], MD;  Location: [place];  Service: General;  Laterality: N/A;  . RECONSTRUCTION BREAST W/ TRAM FLAP Right   . TONSILLECTOMY    . TRANSTHORACIC ECHOCARDIOGRAM  12/24/2009   LVEF =>55%, normal study  . UPPER GI ENDOSCOPY      Outpatient Medications Prior to Visit  Medication Sig Dispense Refill  . amLODipine (NORVASC) 10 MG tablet Take 1 tablet (10 mg total) by mouth daily. 90 tablet 3  . aspirin 81 MG tablet Take 81 mg by mouth daily.     . carvedilol (COREG) 25 MG tablet Take 1 tablet (25 mg total) by mouth 2 (two) times daily with a meal. 180 tablet 3  . furosemide (LASIX) 40 MG tablet Take 1 tablet (40 mg total) by mouth daily. 30 tablet 11  . gabapentin (NEURONTIN) 100 MG capsule Take 1 capsule (100 mg total) by mouth every 8 (eight) hours as needed. 180 capsule 2  . glucose blood test strip 1 each by Other route 2 (two) times daily. Use Onetouch verio test strips as instructed to check blood sugar twice daily. 100 each 2  . hydrALAZINE (APRESOLINE) 100 MG tablet TAKE THREE TIMES DAILY-AM, MIDDLE OF THE DAY AND IN THE EVENING. 90 tablet 3  . KLOR-CON M10 10 MEQ tablet TAKE 1 TABLET BY MOUTH EVERY DAY 90 tablet 1  . lansoprazole (PREVACID) 15 MG capsule Take 1 capsule (15 mg total) by mouth  daily. 90 capsule 1  . loratadine (CLARITIN) 10 MG tablet Take 10 mg by mouth daily as needed for allergies.    . Omega 3 1000 MG CAPS Take 2,000 mg by mouth daily.     . sitaGLIPtin (JANUVIA) 100 MG tablet Take 1 tablet (100 mg total) by mouth daily. 90 tablet 3  . valsartan (DIOVAN) 320 MG tablet Take 1 tablet (320 mg total) by mouth daily. 90 tablet 3  . rosuvastatin (CRESTOR) 20 MG tablet Take 1 tablet (20 mg total) by mouth daily. 90 tablet 3   Facility-Administered Medications Prior to Visit  Medication Dose Route Frequency Provider Last Rate Last Admin  . triamcinolone acetonide (KENALOG) 10 MG/ML injection 10 mg  10 mg Other Once Harriet Masson, DPM         Allergies:  Patient has no known allergies.   Social History   Socioeconomic History  . Marital status: Single    Spouse name: Not on file  . Number of children: 2  . Years of education: Not on file  . Highest education level: Not on file  Occupational History  . Occupation: retired  Tobacco Use  . Smoking status: Former Smoker    Packs/day: 0.25    Years: 25.00    Pack years: 6.25    Types: Cigarettes    Quit date: 1990    Years since quitting: 32.2  . Smokeless tobacco: Never Used  Vaping Use  . Vaping Use: Never used  Substance and Sexual Activity  . Alcohol use: Not Currently    Alcohol/week: 1.0 standard drink    Types: 1 Glasses of wine per week    Comment: occasional wine  . Drug use: No  . Sexual activity: Yes    Partners: Male    Birth control/protection: Condom, Post-menopausal    Comment: First sexual encounter age 21. Fewer than 5 partners in life time.  Other Topics Concern  . 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   Social Determinants of Health   Financial  Resource Strain: [place]   . Difficulty of Paying Living Expenses: Not hard at all  Food Insecurity: No Food Insecurity  . Worried About Charity fundraiser in the Last Year: Never true  . Ran Out of Food in the Last Year: Never true  Transportation Needs: No Transportation Needs  . Lack of Transportation (Medical): No  . Lack of Transportation (Non-Medical): No  Physical Activity: Inactive  . Days of Exercise per Week: 0 days  . Minutes of Exercise per Session: 0 min  Stress: No Stress Concern Present  . Feeling of Stress : Not at all  Social Connections: Moderately Isolated  . Frequency of Communication with Friends and Family: More than three times a week  . Frequency of Social Gatherings with Friends and Family: More than three times a week  . Attends Religious Services: More than 4 times per year  . Active Member of Clubs or Organizations: No  . Attends Archivist Meetings: Never  . Marital Status: Widowed     Family History:  The patient's family history includes Breast cancer in her cousin; Diabetes in her brother; Heart attack in her maternal grandmother; Heart disease in her father and mother; Hypertension in her father and mother; Prostate cancer (age of onset: 38) in her brother; Stroke in her maternal grandfather.   ROS:   Please see the history of present illness.    ROS All other systems are reviewed and are negative.   PHYSICAL EXAM:   VS:  BP (!) 139/52   Pulse 77   Ht 5\' 5"  (1.651 m)   Wt 128 lb 3.2 oz (58.2 kg)   LMP  (LMP Unknown)   SpO2 98%   BMI 21.33 kg/m       General: Alert, oriented x3, no distress, very lean Head: no evidence of trauma, PERRL, EOMI, no exophtalmos or lid lag, no myxedema, no xanthelasma; normal ears, nose and oropharynx Neck: normal jugular venous pulsations and no hepatojugular reflux; brisk carotid pulses  without delay and no carotid bruits Chest: clear to auscultation, no signs of consolidation by percussion or  palpation, normal fremitus, symmetrical and full respiratory excursions Cardiovascular: normal position and quality of the apical impulse, regular rhythm, normal first and second heart sounds, no murmurs, rubs or gallops Abdomen: no tenderness or distention, no masses by palpation, no abnormal pulsatility or arterial bruits, normal bowel sounds, no hepatosplenomegaly Extremities: no clubbing, cyanosis or edema; 2+ radial, ulnar and brachial pulses bilaterally; 2+ right femoral, posterior tibial and dorsalis pedis pulses; 2+ left femoral, posterior tibial and dorsalis pedis pulses; no subclavian or femoral bruits Neurological: grossly nonfocal Psych: Normal mood and affect   Wt Readings from Last 3 Encounters:  05/31/20 128 lb 3.2 oz (58.2 kg)  05/29/20 127 lb (57.6 kg)  05/15/20 121 lb 6.4 oz (55.1 kg)      Studies/Labs Reviewed:   EKG:  EKG is not ordered today.  The tracing from January 18, 2020, minor RSR prime pattern in lead V1, otherwise normal tracing.  Recent Labs: 01/18/2020: B Natriuretic Peptide 1,816.9 04/03/2020: Magnesium 2.4 05/29/2020: ALT 12; BUN 38; Creatinine 1.59; Hemoglobin 8.1; Platelet Count 156; Potassium 4.6; Sodium 141   Lipid Panel     Component Value Date/Time   CHOL 239 (H) 02/14/2020 1122   TRIG 55 02/14/2020 1122   HDL 85 02/14/2020 1122   CHOLHDL 2.8 02/14/2020 1122   CHOLHDL 3 09/01/2018 1435   VLDL 9.2 09/01/2018 1435   LDLCALC 145 (H) 02/14/2020 1122   LABVLDL 9 02/14/2020 1122     09/01/2018 total cholesterol 190, triglycerides 46, HDL 60, LDL 120, A1c 6.4% 01/04/2019 hemoglobin 9.2, creatinine 1.08, potassium 4.0, normal TSH and ALT  ASSESSMENT:    1. Chronic diastolic heart failure ([name])   2. Essential hypertension   3. Stage 3b chronic kidney disease ([place])   4. Type 2 diabetes mellitus with stage 3b chronic kidney disease, without long-term current use of insulin ([place])   5. Hypercholesterolemia   6. Coronary artery calcification    7. Aortic atherosclerosis ([place])   8. Cholangiocarcinoma metastatic to liver [facility])      PLAN:  In order of problems listed above:  1. CHF: Appears clinically euvolemic.  Blood pressure control is fair.  Reviewed the importance of sodium restriction and daily weight monitoring.  She had severe hypertension when she presents with pulmonary edema. and its not impossible for her to have renal artery stenosis or painless coronary ischemia due to multivessel CAD.  She is not a great candidate for invasive evaluation for coronary disease and definitely not a good candidate for aggressive surgical revascularization.  Conservative medical management appears appropriate. 2. HTN: Currently well controlled.  On high-dose ARB, beta-blockers, calcium channel blocker and hydralazine in addition to loop diuretic.  Cannot exclude renal artery stenosis, knowing her history of PAD and aortic atherosclerosis, but not a good candidate for invasive evaluation or treatment.  On maximum dose ARB.  Renal function has recently shown a pattern of improvement.  Consider adding spironolactone if blood pressure still high. 3. CKD 3: Most recent creatinine has stabilized around 1.6 (GFR 30) 4. DM: Well-controlled, currently on monotherapy with Januvia. 5. HLP: Substantial weight loss and off statin since starting chemotherapy.  Follow-up from December shows an LDL cholesterol 145.  Uncertain of the benefit of statin therapy considering the poor prognosis related to her malignancy. 6. Coronary calcification: She does not have angina pectoris and she had a low risk nuclear stress test 2 years ago.  7. Aortic atherosclerosis/PAD:  normal ABI in August 2019, not reevaluated since.  Denies claudication. 8. Cholangiocarcinoma: This is not resectable, she is not a candidate for immunotherapy and the prognosis is very guarded.    Medication Adjustments/Labs and Tests Ordered: Current medicines are reviewed at length with the patient  today.  Concerns regarding medicines are outlined above.  Medication changes, Labs and Tests ordered today are listed in the Patient Instructions below. Patient Instructions  Medication Instructions:  No changes *If you need a refill on your cardiac medications before your next appointment, please call your pharmacy*   Lab Work: None ordered If you have labs (blood work) drawn today and your tests are completely normal, you will receive your results only by: Marland Kitchen MyChart Message (if you have MyChart) OR . A paper copy in the mail If you have any lab test that is abnormal or we need to change your treatment, we will call you to review the results.   Testing/Procedures: None ordered   Follow-Up: At [facility], you and your health needs are our priority.  As part of our continuing mission to provide you with exceptional heart care, we have created designated Provider Care Teams.  These Care Teams include your primary Cardiologist (physician) and Advanced Practice Providers (APPs -  Physician Assistants and Nurse Practitioners) who all work together to provide you with the care you need, when you need it.  We recommend signing up for the patient portal called "MyChart".  Sign up information is provided on this After Visit Summary.  MyChart is used to connect with patients for Virtual Visits (Telemedicine).  Patients are able to view lab/test results, encounter notes, upcoming appointments, etc.  Non-urgent messages can be sent to your provider as well.   To learn more about what you can do with MyChart, go to [email or web address].    Your next appointment:   3 month(s)  The format for your next appointment:   In Person  Provider:   You may see Sanda Klein, MD or one of the following Advanced Practice Providers on your designated Care Team:    Almyra Deforest, PA-C  Fabian Sharp, [place] or   Roby Lofts, PA-C         Signed, Sanda Klein, MD  06/03/2020 7:14 PM    [address], [place], [place]  [address] Phone: [phone]; Fax: [phone]

## 2020-05-31 NOTE — Patient Instructions (Signed)
Medication Instructions:  °No changes °*If you need a refill on your cardiac medications before your next appointment, please call your pharmacy* ° ° °Lab Work: °None ordered °If you have labs (blood work) drawn today and your tests are completely normal, you will receive your results only by: °• MyChart Message (if you have MyChart) OR °• A paper copy in the mail °If you have any lab test that is abnormal or we need to change your treatment, we will call you to review the results. ° ° °Testing/Procedures: °None ordered ° ° °Follow-Up: °At [facility], you and your health needs are our priority.  As part of our continuing mission to provide you with exceptional heart care, we have created designated Provider Care Teams.  These Care Teams include your primary Cardiologist (physician) and Advanced Practice Providers (APPs -  Physician Assistants and Nurse Practitioners) who all work together to provide you with the care you need, when you need it. ° °We recommend signing up for the patient portal called "MyChart".  Sign up information is provided on this After Visit Summary.  MyChart is used to connect with patients for Virtual Visits (Telemedicine).  Patients are able to view lab/test results, encounter notes, upcoming appointments, etc.  Non-urgent messages can be sent to your provider as well.   °To learn more about what you can do with MyChart, go to [email or web address].   ° °Your next appointment:   °3 month(s) ° °The format for your next appointment:   °In Person ° °Provider:   °You may see Mihai [name], MD or one of the following Advanced Practice Providers on your designated Care Team:   °· Hao Meng, PA-C °· Angela Duke, PA-C or  °· Krista Kroeger, PA-C ° ° ° °

## 2020-06-03 ENCOUNTER — Other Ambulatory Visit: Payer: Self-pay

## 2020-06-03 ENCOUNTER — Telehealth: Payer: Self-pay | Admitting: Hematology

## 2020-06-03 ENCOUNTER — Encounter: Payer: Self-pay | Admitting: Cardiovascular Disease

## 2020-06-03 DIAGNOSIS — D630 Anemia in neoplastic disease: Secondary | ICD-10-CM

## 2020-06-03 NOTE — Telephone Encounter (Signed)
Scheduled per sch msg. Called and spoke with patient. Confirmed appt  

## 2020-06-05 ENCOUNTER — Other Ambulatory Visit: Payer: Self-pay

## 2020-06-05 ENCOUNTER — Inpatient Hospital Stay: Payer: Medicare Other

## 2020-06-05 DIAGNOSIS — C786 Secondary malignant neoplasm of retroperitoneum and peritoneum: Secondary | ICD-10-CM | POA: Diagnosis not present

## 2020-06-05 DIAGNOSIS — C221 Intrahepatic bile duct carcinoma: Secondary | ICD-10-CM

## 2020-06-05 DIAGNOSIS — D6481 Anemia due to antineoplastic chemotherapy: Secondary | ICD-10-CM | POA: Diagnosis not present

## 2020-06-05 DIAGNOSIS — D630 Anemia in neoplastic disease: Secondary | ICD-10-CM

## 2020-06-05 DIAGNOSIS — Z5111 Encounter for antineoplastic chemotherapy: Secondary | ICD-10-CM | POA: Diagnosis not present

## 2020-06-05 DIAGNOSIS — Z95828 Presence of other vascular implants and grafts: Secondary | ICD-10-CM

## 2020-06-05 LAB — CBC WITH DIFFERENTIAL (CANCER CENTER ONLY)
Abs Immature Granulocytes: 0.03 10*3/uL (ref 0.00–0.07)
Basophils Absolute: 0 10*3/uL (ref 0.0–0.1)
Basophils Relative: 1 %
Eosinophils Absolute: 0 10*3/uL (ref 0.0–0.5)
Eosinophils Relative: 2 %
HCT: 24.5 % — ABNORMAL LOW (ref 36.0–46.0)
Hemoglobin: 7.9 g/dL — ABNORMAL LOW (ref 12.0–15.0)
Immature Granulocytes: 1 %
Lymphocytes Relative: 49 %
Lymphs Abs: 1.4 10*3/uL (ref 0.7–4.0)
MCH: 32 pg (ref 26.0–34.0)
MCHC: 32.2 g/dL (ref 30.0–36.0)
MCV: 99.2 fL (ref 80.0–100.0)
Monocytes Absolute: 0.3 10*3/uL (ref 0.1–1.0)
Monocytes Relative: 12 %
Neutro Abs: 1 10*3/uL — ABNORMAL LOW (ref 1.7–7.7)
Neutrophils Relative %: 35 %
Platelet Count: 190 10*3/uL (ref 150–400)
RBC: 2.47 MIL/uL — ABNORMAL LOW (ref 3.87–5.11)
RDW: 17.4 % — ABNORMAL HIGH (ref 11.5–15.5)
WBC Count: 2.7 10*3/uL — ABNORMAL LOW (ref 4.0–10.5)
nRBC: 0 % (ref 0.0–0.2)

## 2020-06-05 LAB — SAMPLE TO BLOOD BANK

## 2020-06-05 MED ORDER — SODIUM CHLORIDE 0.9% FLUSH
10.0000 mL | Freq: Once | INTRAVENOUS | Status: AC
Start: 2020-06-05 — End: 2020-06-05
  Administered 2020-06-05: 10 mL
  Filled 2020-06-05: qty 10

## 2020-06-05 MED ORDER — HEPARIN SOD (PORK) LOCK FLUSH 100 UNIT/ML IV SOLN
500.0000 [IU] | Freq: Once | INTRAVENOUS | Status: AC
  Administered 2020-06-05: 500 [IU]
  Filled 2020-06-05: qty 5

## 2020-06-05 NOTE — Patient Instructions (Signed)

## 2020-06-07 NOTE — Progress Notes (Signed)
[place]   [phone] [phone]   Clinic Follow up Note   Patient Care Team: [name], MD as PCP - General (Family Medicine) [name], [name], MD as PCP - Cardiology (Cardiology) [name], [name], MD as Consulting Physician (Cardiology) [name], MD as Consulting Physician (Obstetrics and Gynecology) [name], [name], NP as Nurse Practitioner (Hematology and Oncology) [facility] [name], MD as Consulting Physician (Hematology) [name], [name], MD as Consulting Physician (Gastroenterology) Virgina Evener, Dawn, RN (Inactive) as Oncology Nurse Navigator [name], MD as Consulting Physician (General Surgery)  Date of Service:  06/12/2020  CHIEF COMPLAINT: F/u ofcholangiocarcinomaof liver  SUMMARY OF ONCOLOGIC HISTORY: Oncology History Overview Note  Cancer Staging Intrahepatic cholangiocarcinoma ([place]) Staging form: Intrahepatic Bile Duct, AJCC 8th Edition - Clinical stage from 09/23/2018: Stage IB (cT1b, cN0, cM0) - Signed by [name], MD on 10/06/2018    Intrahepatic cholangiocarcinoma ([place])  09/07/2018 Imaging   CT Chest IMPRESSION: 1. New, enhancing mass involving segment 4 of the liver and fundus of gallbladder is concerning for malignancy. This may represent either metastatic disease from breast cancer or neoplasm primary to the liver or hepatic biliary tree. Further evaluation with contrast enhanced CT of the abdomen and pelvis is recommended. 2. No findings to suggest metastatic disease within the chest. 3.  Aortic Atherosclerosis (ICD10-I70.0). 4. Coronary artery calcifications.   09/13/2018 Pathology Results   Diagnosis Liver, needle/core biopsy - ADENOCARCINOMA. Microscopic Comment Immunohistochemistry for CK7 is positive. CK20, TTF1, CDX-2, GATA-3, PAX 8, Qualitative ER, p63 and CK5/6 are negative. The provided clinical history of remote mammary carcinoma is noted. Based on the  morphology and immunophenotype of the adenocarcinoma observed in this specimen, primary cholangiocarcinoma is favored. Clinical and radiologic correlation are  encouraged. Results reported to [employer] on 09/15/2018. Intradepartmental consultation (Dr. Vic Ripper).   09/13/2018 Initial Diagnosis   Cholangiocarcinoma ([place])   09/23/2018 Procedure   Colonoscopy by Dr. Havery Moros 09/23/18  IMPRESSION - Two 3 to 4 mm polyps in the ascending colon, removed with a cold snare. Resected and retrieved. - Five 3 to 5 mm polyps in the transverse colon, removed with a cold snare. Resected and retrieved. - One 5 mm polyp at the splenic flexure, removed with a cold snare. Resected and retrieved. - Three 3 to 5 mm polyps in the sigmoid colon, removed with a cold snare. Resected and retrieved. - The examination was otherwise normal. Upper Endopscy by Dr. Havery Moros 09/23/18  IMPRESSION - Esophagogastric landmarks identified. - 2 cm hiatal hernia. - Normal esophagus otherwise. - A single gastric polyp. Resected and retrieved. - Mild gastritis. Biopsied. - Normal duodenal bulb and second portion of the duodenum.   09/23/2018 Pathology Results   Diagnosis 09/23/18 1. Surgical [P], duodenum - BENIGN SMALL BOWEL MUCOSA. - NO ACTIVE INFLAMMATION OR VILLOUS ATROPHY IDENTIFIED. 2. Surgical [P], stomach, polyp - HYPERPLASTIC POLYP(S). - THERE IS NO EVIDENCE OF MALIGNANCY. 3. Surgical [P], gastric antrum and gastric body - CHRONIC INACTIVE GASTRITIS. - THERE IS NO EVIDENCE OF HELICOBACTER-PYLORI, DYSPLASIA, OR MALIGNANCY. - SEE COMMENT. 4. Surgical [P], colon, sigmoid, splenic flexure, transverse and ascending, polyp (9) - TUBULAR ADENOMA(S). - SESSILE SERRATED POLYP WITHOUT CYTOLOGIC DYSPLASIA. - HIGH GRADE DYSPLASIA IS NOT IDENTIFIED. 5. Surgical [P], colon, sigmoid, polyp (2) - HYPERPLASTIC POLYP(S). - THERE IS NO EVIDENCE OF MALIGNANCY.   09/23/2018 Cancer Staging   Staging form: Intrahepatic Bile  Duct, AJCC 8th Edition - Clinical stage from 09/23/2018: Stage IB (cT1b, cN0, cM0) - Signed  by [name], MD on 10/06/2018   09/29/2018 PET scan   PET 09/29/18 IMPRESSION: 1. Hypermetabolic mass in the RIGHT hepatic lobe consistent with biopsy proven adenocarcinoma. No additional liver metastasis. 2. No evidence of local breast cancer recurrence in the RIGHT breast or RIGHT axilla. 3. Mild bilateral hypermetabolic adrenal glands is favored benign hyperplasia. 4. No evidence of additional metastatic disease on skull base to thigh FDG PET scan.   11/06/2018 Genetic Testing   Negative genetic testing on the [employer] Common Hereditary Cancers panel. A variant of uncertain significance was identified in one of her APC genes, called c.1243G>A (p.Ala415Thr).  The Common Hereditary Cancers Panel offered by [employer] includes sequencing and/or deletion duplication testing of the following 48 genes: APC, ATM, AXIN2, BARD1, BMPR1A, BRCA1, BRCA2, BRIP1, CDH1, CDK4, CDKN2A (p14ARF), CDKN2A (p16INK4a), CHEK2, CTNNA1, DICER1, EPCAM (Deletion/duplication testing only), GREM1 (promoter region deletion/duplication testing only), KIT, MEN1, MLH1, MSH2, MSH3, MSH6, MUTYH, NBN, NF1, NHTL1, PALB2, PDGFRA, PMS2, POLD1, POLE, PTEN, RAD50, RAD51C, RAD51D, RNF43, SDHB, SDHC, SDHD, SMAD4, SMARCA4. STK11, TP53, TSC1, TSC2, and VHL.  The following genes were evaluated for sequence changes only: SDHA and HOXB13 c.251G>A variant only.    11/17/2018 Pathology Results   Diagnosis 11/17/18 1. Soft tissue, biopsy, Diaphragmatic nodules - METASTATIC ADENOCARCINOMA, CONSISTENT WITH PATIENT'S CLINICAL HISTORY OF CHOLANGIOCARCINOMA. SEE NOTE 2. Liver, biopsy, Left - LIVER PARENCHYMA WITH A BENIGN FIBROTIC NODULE - NO EVIDENCE OF MALIGNANCY 3. Stomach, biopsy - BENIGN PAPILLARY MESOTHELIAL HYPERPLASIA - NO EVIDENCE OF MALIGNANCY   11/29/2018 Imaging   CT CAP WO Contrast  IMPRESSION: 1. Dominant liver mass appears grossly stable from  09/29/2018. Additional liver lesions are too small to characterize but were not shown to be hypermetabolic on PET. 2. Mild nodularity of both adrenal glands with associated hypermetabolism on 09/29/2018. Continued attention on follow-up exams is warranted. 3. Small right lower lobe nodules, stable from 09/07/2018. Again, attention on follow-up is recommended. 4. Trace bilateral pleural fluid. 5. Aortic atherosclerosis (ICD10-170.0). Coronary artery calcification. 6. Enlarged pulmonic trunk, indicative of pulmonary arterial hypertension.     11/30/2018 - 06/14/2019 Chemotherapy   First line chemo Oxaliplatin and gemcitabine q2weeks starting 11/30/18. Stopped Oxaliplatin on 05/03/19 due to worsening neuropathy. Added Cisplatin with C13 on 05/17/19 and stopped on C15 06/14/19 due to neuropathy. Reduced to maintenance single agent Gemcitabine on 06/28/19.    01/20/2019 Imaging   CT CAP IMPRESSION: restaging  1. Mild interval increase in size of dominant liver mass involving segment 4 and segment 5. 2. No significant or progressive adrenal nodularity identified to suggest metastatic disease. 3. Unchanged appearance of small right lower lobe and lingular lung nodules, nonspecific.   03/21/2019 PET scan   IMPRESSION: 1. Large right hepatic mass with SUV uptake near background hepatic activity, dramatic response to therapy, also with decrease in size when compared to the prior study. 2. Signs of prior right mastectomy and axillary dissection as before. 3. Left adrenal activity in no longer above the level of activity seen in the contralateral, right adrenal gland or in the liver. Attention on follow-up. 4. No new signs of disease.   06/27/2019 PET scan   IMPRESSION: 1. Further decrease in size of a dominant hepatic mass which is non FDG avid. 2. No evidence of metastatic disease. 3. No evidence of left adrenal hypermetabolism or mass. 4. Incidental findings, including: Uterine fibroids.  Coronary artery atherosclerosis. Aortic Atherosclerosis (ICD10-I70.0). Pulmonary artery enlargement suggests pulmonary arterial hypertension.   06/28/2019 -  Chemotherapy   Maintenance single  agent Gemcitabine q2weeks starting on 06/28/19 with C16.  ------On chemo break since 12/27/19.  ------She restarted Single Agent Gemcitabine q2weeks on 04/03/20 due to mild disease progression in liver on 02/2020 PET    09/15/2019 PET scan   IMPRESSION: 1. In the region of regional tumor at the junction of the right and left hepatic lobe, there is continued hypodensity and overall activity slightly less than that of the surrounding normal liver, indicating effective response to therapy. No current worrisome hypermetabolic lesion is identified in the liver or elsewhere. 2. Chronic asymmetric edema in the subcutaneous tissues of the right upper extremity, nonspecific. The patient has had a prior right mastectomy and right axillary dissection. 3. Other imaging findings of potential clinical significance: Aortic Atherosclerosis (ICD10-I70.0). Coronary atherosclerosis. Mild cardiomegaly. Small right and trace left pleural effusions. Subcutaneous and mild mesenteric edema. Suspected uterine fibroids.   12/18/2019 PET scan   IMPRESSION: 1. No significant abnormal activity to suggest active/recurrent malignancy. 2. Lingual tonsillar activity is mildly increased from prior but probably incidental/physiologic, attention on follow up suggested. 3. Other imaging findings of potential clinical significance: Third spacing of fluid with trace ascites, mesenteric edema, subcutaneous edema, and possibly subtle pulmonary edema. Mild cardiomegaly. Aortic Atherosclerosis (ICD10-I70.0). Coronary atherosclerosis. Right ovarian dermoid. Calcified uterine fibroids. Renal cysts. Right glenohumeral arthropathy. Mild to moderate scoliosis. Suspected pulmonary arterial hypertension.   03/14/2020 PET scan   IMPRESSION: 1.  New small focus of hypermetabolism in segment 4A of the liver worrisome for new metastatic focus. 2. No other findings for metastatic disease involving the neck, chest, abdomen or pelvis.     06/11/2020 PET scan   IMPRESSION: 1. No suspicious hypermetabolic activity within the neck, chest, abdomen or pelvis. 2. The treated dominant peripheral cholangiocarcinoma appears slightly enlarged compared with recent prior studies, and there is a questionable new lesion more superiorly in the left lobe. Although without associated hypermetabolic activity, these lesions are suspicious and would be better assessed with abdominal MRI without and with contrast.      CURRENT THERAPY:  Maintenance single agent Gemcitabine q2weeks starting on 06/28/19 with C16.On chemo break since 12/27/19.She restarted Single Agent Gemcitabine q2weeks on 04/03/20 due to mild disease progression.  INTERVAL HISTORY:  Donna May is here for a follow up. She presents to the clinic alone. She notes she is claustrophobic and does not life MRI, but is willing to try open MRI. She notes she is willing to think about immunopathy but will hold on until it is FDA approved. She notes she did feel more tired with Hg 7.9. She was able to recover with Hg 8.6 today. She was recently seen by her cardiologist and was seen to be stable.    REVIEW OF SYSTEMS:   Constitutional: Denies fevers, chills or abnormal weight loss Eyes: Denies blurriness of vision Ears, nose, mouth, throat, and face: Denies mucositis or sore throat Respiratory: Denies cough, dyspnea or wheezes Cardiovascular: Denies palpitation, chest discomfort or lower extremity swelling Gastrointestinal:  Denies nausea, heartburn or change in bowel habits Skin: Denies abnormal skin rashes Lymphatics: Denies new lymphadenopathy or easy bruising Neurological: (+) Neuropathy, stable  Behavioral/Psych: Mood is stable, no new changes  All other systems were reviewed with  the patient and are negative.  MEDICAL HISTORY:  Past Medical History:  Diagnosis Date  . Anemia   . Anxiety   . Arthritis   . Breast cancer ([place]) 1992  . Cataract    Bilateral eyes - surgery to remove  . Chronic diastolic  CHF (congestive heart failure) ([place])   . Chronic kidney disease    CKD stage 3  . Complication of anesthesia    "something they use make me itch for a couple of days."  . Depression   . Diabetes mellitus    type 2  . Duodenitis 01/18/2002  . Fainting spell   . GERD (gastroesophageal reflux disease)   . Heart murmur    never has caused any problems  . Hiatal hernia 08/08/2008, 01/18/2002  . History of pneumonia    x 2  . Hyperlipidemia   . Hypertension   . Liver cancer ([place]) 08/2018  . Lymphedema 2017   Right arm    SURGICAL HISTORY: Past Surgical History:  Procedure Laterality Date  . AXILLARY SURGERY     cyst removal, right  . COLONOSCOPY  09/23/2018   Dr. Havery Moros - polyps  . EYE SURGERY Bilateral    cataracts to remove  . LAPAROSCOPY N/A 11/17/2018   Procedure: LAPAROSCOPY DIAGNOSTIC, INTRAOPERATIVE ULTRASOUND, PERITONEAL BIOPSIES;  Surgeon: [name], MD;  Location: [place];  Service: General;  Laterality: N/A;  GENERAL AND EPIDURAL  . LIVER BIOPSY  08/2018   Dr. Lindwood Coke  . MASTECTOMY  1992   right, with flap  . NM MYOCAR PERF WALL MOTION  06/11/2009   Protocol:[place], post stress EF58%, EKG negative for ischemia, low risk  . PORTACATH PLACEMENT N/A 12/02/2018   Procedure: INSERTION PORT-A-CATH;  Surgeon: [name], MD;  Location: [place];  Service: General;  Laterality: N/A;  . RECONSTRUCTION BREAST W/ TRAM FLAP Right   . TONSILLECTOMY    . TRANSTHORACIC ECHOCARDIOGRAM  12/24/2009   LVEF =>55%, normal study  . UPPER GI ENDOSCOPY      I have reviewed the social history and family history with the patient and they are unchanged from previous note.  ALLERGIES:  has No Known Allergies.  MEDICATIONS:  Current Outpatient Medications   Medication Sig Dispense Refill  . amLODipine (NORVASC) 10 MG tablet Take 1 tablet (10 mg total) by mouth daily. 90 tablet 3  . aspirin 81 MG tablet Take 81 mg by mouth daily.     . carvedilol (COREG) 25 MG tablet Take 1 tablet (25 mg total) by mouth 2 (two) times daily with a meal. 180 tablet 3  . furosemide (LASIX) 40 MG tablet Take 1 tablet (40 mg total) by mouth daily. 30 tablet 11  . gabapentin (NEURONTIN) 100 MG capsule Take 1 capsule (100 mg total) by mouth every 8 (eight) hours as needed. 180 capsule 2  . glucose blood test strip 1 each by Other route 2 (two) times daily. Use Onetouch verio test strips as instructed to check blood sugar twice daily. 100 each 2  . hydrALAZINE (APRESOLINE) 100 MG tablet TAKE THREE TIMES DAILY-AM, MIDDLE OF THE DAY AND IN THE EVENING. 90 tablet 3  . KLOR-CON M10 10 MEQ tablet TAKE 1 TABLET BY MOUTH EVERY DAY 90 tablet 1  . lansoprazole (PREVACID) 15 MG capsule Take 1 capsule (15 mg total) by mouth daily. 90 capsule 1  . loratadine (CLARITIN) 10 MG tablet Take 10 mg by mouth daily as needed for allergies.    . Omega 3 1000 MG CAPS Take 2,000 mg by mouth daily.     . rosuvastatin (CRESTOR) 20 MG tablet Take 1 tablet (20 mg total) by mouth daily. 90 tablet 3  . sitaGLIPtin (JANUVIA) 100 MG tablet Take 1 tablet (100 mg total) by mouth daily. 90 tablet 3  .  valsartan (DIOVAN) 320 MG tablet Take 1 tablet (320 mg total) by mouth daily. 90 tablet 3   Current Facility-Administered Medications  Medication Dose Route Frequency Provider Last Rate Last Admin  . triamcinolone acetonide (KENALOG) 10 MG/ML injection 10 mg  10 mg Other Once Harriet Masson, DPM        PHYSICAL EXAMINATION: ECOG PERFORMANCE STATUS: 2 - Symptomatic, <50% confined to bed Blood pressure 156/56, heart rate 88, respirate 16, temperature 36.7, pulse ox 100% on room air Due to COVID19 we will limit examination to appearance. Patient had no complaints.  GENERAL:alert, no distress and  comfortable SKIN: skin color normal, no rashes or significant lesions EYES: normal, Conjunctiva are pink and non-injected, sclera clear  NEURO: alert & oriented x 3 with fluent speech   LABORATORY DATA:  I have reviewed the data as listed CBC Latest Ref Rng & Units 06/12/2020 06/05/2020 05/29/2020  WBC 4.0 - 10.5 K/uL 4.9 2.7(L) 5.7  Hemoglobin 12.0 - 15.0 g/dL 8.6(L) 7.9(L) 8.1(L)  Hematocrit 36.0 - 46.0 % 27.2(L) 24.5(L) 24.9(L)  Platelets 150 - 400 K/uL 170 190 156     CMP Latest Ref Rng & Units 06/12/2020 05/29/2020 05/15/2020  Glucose 70 - 99 mg/dL 114(H) 90 85  BUN 8 - 23 mg/dL 35(H) 38(H) 43(H)  Creatinine 0.44 - 1.00 mg/dL 1.42(H) 1.59(H) 1.56(H)  Sodium 135 - 145 mmol/L 142 141 140  Potassium 3.5 - 5.1 mmol/L 4.3 4.6 4.7  Chloride 98 - 111 mmol/L 110 109 108  CO2 22 - 32 mmol/L _0 Calcium 8.9 - 10.3 mg/dL 8.5(L) 8.5(L) 8.9  Total Protein 6.5 - 8.1 g/dL 6.9 6.7 7.0  Total Bilirubin 0.3 - 1.2 mg/dL 0.3 0.2(L) <0.2(L)  Alkaline Phos 38 - 126 U/L 63 54 51  AST 15 - 41 U/L _1 ALT 0 - 44 U/L _2 RADIOGRAPHIC STUDIES: I have personally reviewed the radiological images as listed and agreed with the findings in the report. NM PET Image Restag (PS) Skull Base To Thigh  Result Date: 06/12/2020 CLINICAL DATA:  Subsequent treatment strategy for intrahepatic cholangiocarcinoma post chemotherapy. EXAM: NUCLEAR MEDICINE PET SKULL BASE TO THIGH TECHNIQUE: 6.04 mCi F-18 FDG was injected intravenously. Full-ring PET imaging was performed from the skull base to thigh after the radiotracer. CT data was obtained and used for attenuation correction and anatomic localization. Fasting blood glucose: 93 mg/dl COMPARISON:  PET-CT 03/14/2020. CTs of the chest, abdomen and pelvis 01/01/2020. FINDINGS: Mediastinal blood pool activity: SUV max 2.2 NECK: No hypermetabolic cervical lymph nodes are identified.There are no lesions of the pharyngeal mucosal space. Physiologic activity  associated with the muscles of phonation. Incidental CT findings: Bilateral carotid atherosclerosis. CHEST: There are no hypermetabolic mediastinal, hilar or axillary lymph nodes. There is no hypermetabolic pulmonary activity or suspicious pulmonary nodularity. Incidental CT findings: Previously demonstrated pleural effusions have resolved. There are stable postsurgical changes in the right axilla with adjacent small lymph nodes. Left subclavian Port-A-Cath extends to the upper SVC. There is atherosclerosis of the aorta, great vessels and coronary arteries. There is stable mild central enlargement of the pulmonary arteries. ABDOMEN/PELVIS: There is no residual hepatic hypermetabolic activity. The dominant hepatic lesion involving segments 4 and 5 appears slightly larger, measuring 5.3 x 2.7 cm on image 93/4, although is not associated with any hypermetabolic activity. There is a possible new hypodense lesion in the dome of the left lobe (segment 4A), measuring 10 mm on image 82/4.  This is not hypermetabolic. Scattered hepatic cysts are grossly stable. There is no hypermetabolic activity within the pancreas, adrenal glands or spleen. There is no hypermetabolic nodal activity. Incidental CT findings: Bilateral renal cysts. There is aortic and branch vessel atherosclerosis and calcified uterine fibroids. SKELETON: There is no hypermetabolic activity to suggest osseous metastatic disease. Probable physiologic activity within the posterior left rotator cuff and bilateral forearm musculature. Incidental CT findings: Thoracolumbar scoliosis. IMPRESSION: 1. No suspicious hypermetabolic activity within the neck, chest, abdomen or pelvis. 2. The treated dominant peripheral cholangiocarcinoma appears slightly enlarged compared with recent prior studies, and there is a questionable new lesion more superiorly in the left lobe. Although without associated hypermetabolic activity, these lesions are suspicious and would be better  assessed with abdominal MRI without and with contrast. Electronically Signed   By: Richardean Sale M.D.   On: 06/12/2020 09:13     ASSESSMENT & PLAN:  Donna May is a 80 y.o. female with    1.Intrahepatic cholangiocarcinoma, cT1N0M1,with peritoneal metastasis, MSS, IDH1 mutation (+) -She was diagnosed in 08/2018. Her biopsy of her liver mass shows adenocarcinoma,most consistent withcholangiocarcinoma.Her EGD/Colonoscopy from 7/10/20was negativeand 09/29/18 PET scanshowedno evidence of distant metastasis. -She was brought to OR on 11/17/18,unfortunately she was found to have peritoneal metastasis to right diaphragm and surgery was aborted. -Given her metastatic cancer, Istarted her onsystemicchemo withFirst line chemoOxaliplatinand gemcitabine q2weeksto control her diseasebeginningon 11/30/18.Due to poor tolerance to Oxaliplatin and Cisplatin from neuropathy, she proceeded withsingleagent Gemcitabineevery 2 weeksstarting 06/28/19 with C16.Shewas onchemo break since10/2021. -GivenPET from 03/14/20 showedconcern for new liver lesion, I restarted her onmaintenance Gemcitabine q2 weeksbeginning1/19/22. -I personally reviewed her PET images from 06/11/20 and discussed with patient.  It showed her prior hypermetabolic liver lesion is no longer hypermetabolic on PET, but present and slightly larger in size. No new lesions. I reviewed scan with patient today.  -I recommend liver MRI to further evaluate her liver lesion. She is very reluctant to have MRI. Will hold on it for now.  -Given indeterminate growth of her liver lesion, I discussed the option of adding immunotherapy with Duvantinib, based on the recent TOPAZ-1 phase 3 trial data. I discussed possible autoimmune related side effects such as thyroid dysfunction, pneumonitis, cardiomyopathy, other endocrine disorder, etc.  This is not FDA-approved for her cancer yet, so I would obtain drug replacement if she is interested. She  will think about it. I gave her print out of medication.  -Labs reviewed and adequate to proceed with Gemcitabine today at same dose.  -F/u in 2 weeks   2. Anemia,Secondary to chemoand cancer -Shepreviouslyrequiredblood transfusionsince 03/2019, continue as needed. Will continue prenatal vitamin. -Last blood transfusion10/25/21.  -Hg has been stable in 9 rangeoff chemo. With restart of chemo, Hg has dropped to 8 range. If she is symptomatic or labs drop below 8, will give blood transfusion.  -Hg 8.6 today (06/02/20). No blood transfusion today.   3.Peripheral neuropathy, G3, Secondary to Oxaliplatinand Cisplatin (both d/c) -Due to low tolerance and not much help, she stopped Lyrica,Gabapentinand Cymbalta. -She waspreviouslyseen by Dr Mickeal Skinner.She is now being seen byChiropractor Dr Page Spiro. -She remains to have decreased function overall.Stablewith occasional flaresfrom cold weather. -Will monitor with restart of Gemcitabine.Stable so far. -Continue Gabapentin 128m to use at night as needed  4. H/o right breast cancer, Geneticsnegative, Osteopenia -s/prightmastectomy in 1992, per patient did not require adjuvant therapy. She was last seen by Dr. MJana Hakimin 2017. -Her4/2019 DEXA shows mild osteopenia. F/u with Gyn about continuing Raloxifene.  I do not think she has to continue at this point.  5.Comorbidities:DM,HTN,hiatal hernia, GERD, renal artery stenosis,2021pulmonary edema -Continue medications and f/u with PCP and cardiologist for management. -She is on torsemide twice a week, and hydrochlorothiazide daily  6. Family support, Goal of Care Discussion -She has a son and daughterwho are often busy but help as needed. -The patient understands the goal of care is palliative.She is full code now  7.CKD stage III -Continue to monitor.Stable Cr at 1.2-15.  PLAN: -PET scan reviewed -Labs reviewed and adequate to  proceedwithGemcitabine today at same dose.  -Lab, Flush, F/u and Gemcitabine in 2 weeks -she will think about durvalumab    No problem-specific Assessment & Plan notes found for this encounter.   No orders of the defined types were placed in this encounter.  All questions were answered. The patient knows to call the clinic with any problems, questions or concerns. No barriers to learning was detected. The total time spent in the appointment was 30 minutes.     [name], MD 06/12/2020   I, Joslyn Devon, am acting as scribe for [name], MD.   I have reviewed the above documentation for accuracy and completeness, and I agree with the above.

## 2020-06-11 ENCOUNTER — Other Ambulatory Visit: Payer: Self-pay

## 2020-06-11 ENCOUNTER — Ambulatory Visit (HOSPITAL_COMMUNITY)
Admission: RE | Admit: 2020-06-11 | Discharge: 2020-06-11 | Disposition: A | Discharge status: Home / Self Care | Payer: Medicare Other | Source: Ambulatory Visit | Attending: Hematology | Admitting: Hematology

## 2020-06-11 DIAGNOSIS — D649 Anemia, unspecified: Secondary | ICD-10-CM

## 2020-06-11 DIAGNOSIS — C221 Intrahepatic bile duct carcinoma: Secondary | ICD-10-CM | POA: Insufficient documentation

## 2020-06-11 LAB — GLUCOSE, CAPILLARY: Glucose-Capillary: 93 mg/dL (ref 70–99)

## 2020-06-11 MED ORDER — FLUDEOXYGLUCOSE F - 18 (FDG) INJECTION
6.0400 | Freq: Once | INTRAVENOUS | Status: AC | PRN
  Administered 2020-06-11: 6.04 via INTRAVENOUS

## 2020-06-12 ENCOUNTER — Inpatient Hospital Stay: Payer: Medicare Other

## 2020-06-12 ENCOUNTER — Ambulatory Visit: Payer: Medicare Other | Admitting: Hematology

## 2020-06-12 ENCOUNTER — Other Ambulatory Visit: Payer: Medicare Other

## 2020-06-12 ENCOUNTER — Inpatient Hospital Stay (HOSPITAL_BASED_OUTPATIENT_CLINIC_OR_DEPARTMENT_OTHER): Payer: Medicare Other | Admitting: Hematology

## 2020-06-12 ENCOUNTER — Ambulatory Visit: Payer: Medicare Other

## 2020-06-12 ENCOUNTER — Encounter: Payer: Self-pay | Admitting: Hematology

## 2020-06-12 VITALS — BP 156/56 | HR 88 | Temp 98.0°F | Resp 16

## 2020-06-12 DIAGNOSIS — I248 Other forms of acute ischemic heart disease: Secondary | ICD-10-CM

## 2020-06-12 DIAGNOSIS — C221 Intrahepatic bile duct carcinoma: Secondary | ICD-10-CM

## 2020-06-12 DIAGNOSIS — G620 Drug-induced polyneuropathy: Secondary | ICD-10-CM

## 2020-06-12 DIAGNOSIS — T451X5A Adverse effect of antineoplastic and immunosuppressive drugs, initial encounter: Secondary | ICD-10-CM | POA: Diagnosis not present

## 2020-06-12 DIAGNOSIS — Z5111 Encounter for antineoplastic chemotherapy: Secondary | ICD-10-CM | POA: Diagnosis not present

## 2020-06-12 DIAGNOSIS — D649 Anemia, unspecified: Secondary | ICD-10-CM

## 2020-06-12 DIAGNOSIS — D6481 Anemia due to antineoplastic chemotherapy: Secondary | ICD-10-CM | POA: Diagnosis not present

## 2020-06-12 DIAGNOSIS — C786 Secondary malignant neoplasm of retroperitoneum and peritoneum: Secondary | ICD-10-CM | POA: Diagnosis not present

## 2020-06-12 LAB — CBC WITH DIFFERENTIAL (CANCER CENTER ONLY)
Abs Immature Granulocytes: 0.02 10*3/uL (ref 0.00–0.07)
Basophils Absolute: 0 10*3/uL (ref 0.0–0.1)
Basophils Relative: 1 %
Eosinophils Absolute: 0.2 10*3/uL (ref 0.0–0.5)
Eosinophils Relative: 4 %
HCT: 27.2 % — ABNORMAL LOW (ref 36.0–46.0)
Hemoglobin: 8.6 g/dL — ABNORMAL LOW (ref 12.0–15.0)
Immature Granulocytes: 0 %
Lymphocytes Relative: 22 %
Lymphs Abs: 1.1 10*3/uL (ref 0.7–4.0)
MCH: 31.9 pg (ref 26.0–34.0)
MCHC: 31.6 g/dL (ref 30.0–36.0)
MCV: 100.7 fL — ABNORMAL HIGH (ref 80.0–100.0)
Monocytes Absolute: 0.8 10*3/uL (ref 0.1–1.0)
Monocytes Relative: 17 %
Neutro Abs: 2.7 10*3/uL (ref 1.7–7.7)
Neutrophils Relative %: 56 %
Platelet Count: 170 10*3/uL (ref 150–400)
RBC: 2.7 MIL/uL — ABNORMAL LOW (ref 3.87–5.11)
RDW: 19 % — ABNORMAL HIGH (ref 11.5–15.5)
WBC Count: 4.9 10*3/uL (ref 4.0–10.5)
nRBC: 0 % (ref 0.0–0.2)

## 2020-06-12 LAB — CMP (CANCER CENTER ONLY)
ALT: 12 U/L (ref 0–44)
AST: 18 U/L (ref 15–41)
Albumin: 3.4 g/dL — ABNORMAL LOW (ref 3.5–5.0)
Alkaline Phosphatase: 63 U/L (ref 38–126)
Anion gap: 9 (ref 5–15)
BUN: 35 mg/dL — ABNORMAL HIGH (ref 8–23)
CO2: 23 mmol/L (ref 22–32)
Calcium: 8.5 mg/dL — ABNORMAL LOW (ref 8.9–10.3)
Chloride: 110 mmol/L (ref 98–111)
Creatinine: 1.42 mg/dL — ABNORMAL HIGH (ref 0.44–1.00)
GFR, Estimated: 37 mL/min — ABNORMAL LOW (ref >60)
Glucose, Bld: 114 mg/dL — ABNORMAL HIGH (ref 70–99)
Potassium: 4.3 mmol/L (ref 3.5–5.1)
Sodium: 142 mmol/L (ref 135–145)
Total Bilirubin: 0.3 mg/dL (ref 0.3–1.2)
Total Protein: 6.9 g/dL (ref 6.5–8.1)

## 2020-06-12 MED ORDER — PROCHLORPERAZINE MALEATE 10 MG PO TABS
10.0000 mg | ORAL_TABLET | Freq: Once | ORAL | Status: AC
  Administered 2020-06-12: 10 mg via ORAL

## 2020-06-12 MED ORDER — PROCHLORPERAZINE MALEATE 10 MG PO TABS
ORAL_TABLET | ORAL | Status: AC
  Filled 2020-06-12: qty 1

## 2020-06-12 MED ORDER — HEPARIN SOD (PORK) LOCK FLUSH 100 UNIT/ML IV SOLN
500.0000 [IU] | Freq: Once | INTRAVENOUS | Status: AC | PRN
  Administered 2020-06-12: 500 [IU]
  Filled 2020-06-12: qty 5

## 2020-06-12 MED ORDER — SODIUM CHLORIDE 0.9 % IV SOLN
Freq: Once | INTRAVENOUS | Status: AC
  Filled 2020-06-12: qty 250

## 2020-06-12 MED ORDER — SODIUM CHLORIDE 0.9 % IV SOLN
600.0000 mg/m2 | Freq: Once | INTRAVENOUS | Status: AC
  Administered 2020-06-12: 988 mg via INTRAVENOUS
  Filled 2020-06-12: qty 25.98

## 2020-06-12 MED ORDER — SODIUM CHLORIDE 0.9% FLUSH
10.0000 mL | INTRAVENOUS | Status: DC | PRN
  Administered 2020-06-12: 10 mL
  Filled 2020-06-12: qty 10

## 2020-06-12 NOTE — Patient Instructions (Signed)
[facility] °Discharge Instructions for Patients Receiving Chemotherapy ° °Today you received the following chemotherapy agents Gemzar ° °To help prevent nausea and vomiting after your treatment, we encourage you to take your nausea medication as directed. °  °If you develop nausea and vomiting that is not controlled by your nausea medication, call the clinic.  ° °BELOW ARE SYMPTOMS THAT SHOULD BE REPORTED IMMEDIATELY: °· *FEVER GREATER THAN 100.5 F °· *CHILLS WITH OR WITHOUT FEVER °· NAUSEA AND VOMITING THAT IS NOT CONTROLLED WITH YOUR NAUSEA MEDICATION °· *UNUSUAL SHORTNESS OF BREATH °· *UNUSUAL BRUISING OR BLEEDING °· TENDERNESS IN MOUTH AND THROAT WITH OR WITHOUT PRESENCE OF ULCERS °· *URINARY PROBLEMS °· *BOWEL PROBLEMS °· UNUSUAL RASH °Items with * indicate a potential emergency and should be followed up as soon as possible. ° °Feel free to call the clinic should you have any questions or concerns. The clinic phone number is [phone]. ° °Please show the CHEMO ALERT CARD at check-in to the Emergency Department and triage nurse. ° ° °

## 2020-06-13 ENCOUNTER — Telehealth: Payer: Self-pay | Admitting: Hematology

## 2020-06-13 LAB — BPAM RBC
Blood Product Expiration Date: 202204212359
Blood Product Expiration Date: 202204212359
Unit Type and Rh: 6200
Unit Type and Rh: 6200

## 2020-06-13 LAB — TYPE AND SCREEN
ABO/RH(D): A POS
Antibody Screen: NEGATIVE
Unit division: 0
Unit division: 0

## 2020-06-13 NOTE — Telephone Encounter (Signed)
Checked out appointment. No LOS notes needing to be scheduled. No changes made. 

## 2020-06-24 NOTE — Progress Notes (Signed)
[place]   [phone] Fax:([phone]) [phone]   Clinic Follow up Note   Patient Care Team: [name], MD as PCP - General (Family Medicine) [name], [name], MD as PCP - Cardiology (Cardiology) [name], [name], MD as Consulting Physician (Cardiology) [name], MD as Consulting Physician (Obstetrics and Gynecology) [name], [name], NP as Nurse Practitioner (Hematology and Oncology) [facility] [name], MD as Consulting Physician (Hematology) [name], [name], MD as Consulting Physician (Gastroenterology) Virgina Evener, Dawn, RN (Inactive) as Oncology Nurse Navigator [name], MD as Consulting Physician (General Surgery)  Date of Service:  06/26/2020  CHIEF COMPLAINT: F/u ofcholangiocarcinomaof liver  SUMMARY OF ONCOLOGIC HISTORY: Oncology History Overview Note  Cancer Staging Intrahepatic cholangiocarcinoma ([place]) Staging form: Intrahepatic Bile Duct, AJCC 8th Edition - Clinical stage from 09/23/2018: Stage IB (cT1b, cN0, cM0) - Signed by [name], MD on 10/06/2018    Intrahepatic cholangiocarcinoma ([place])  09/07/2018 Imaging   CT Chest IMPRESSION: 1. New, enhancing mass involving segment 4 of the liver and fundus of gallbladder is concerning for malignancy. This may represent either metastatic disease from breast cancer or neoplasm primary to the liver or hepatic biliary tree. Further evaluation with contrast enhanced CT of the abdomen and pelvis is recommended. 2. No findings to suggest metastatic disease within the chest. 3.  Aortic Atherosclerosis (ICD10-I70.0). 4. Coronary artery calcifications.   09/13/2018 Pathology Results   Diagnosis Liver, needle/core biopsy - ADENOCARCINOMA. Microscopic Comment Immunohistochemistry for CK7 is positive. CK20, TTF1, CDX-2, GATA-3, PAX 8, Qualitative ER, p63 and CK5/6 are negative. The provided clinical history of remote mammary carcinoma is noted. Based on the  morphology and immunophenotype of the adenocarcinoma observed in this specimen, primary cholangiocarcinoma is favored. Clinical and radiologic correlation are  encouraged. Results reported to [employer] on 09/15/2018. Intradepartmental consultation (Dr. Vic Ripper).   09/13/2018 Initial Diagnosis   Cholangiocarcinoma ([place])   09/23/2018 Procedure   Colonoscopy by Dr. Havery Moros 09/23/18  IMPRESSION - Two 3 to 4 mm polyps in the ascending colon, removed with a cold snare. Resected and retrieved. - Five 3 to 5 mm polyps in the transverse colon, removed with a cold snare. Resected and retrieved. - One 5 mm polyp at the splenic flexure, removed with a cold snare. Resected and retrieved. - Three 3 to 5 mm polyps in the sigmoid colon, removed with a cold snare. Resected and retrieved. - The examination was otherwise normal. Upper Endopscy by Dr. Havery Moros 09/23/18  IMPRESSION - Esophagogastric landmarks identified. - 2 cm hiatal hernia. - Normal esophagus otherwise. - A single gastric polyp. Resected and retrieved. - Mild gastritis. Biopsied. - Normal duodenal bulb and second portion of the duodenum.   09/23/2018 Pathology Results   Diagnosis 09/23/18 1. Surgical [P], duodenum - BENIGN SMALL BOWEL MUCOSA. - NO ACTIVE INFLAMMATION OR VILLOUS ATROPHY IDENTIFIED. 2. Surgical [P], stomach, polyp - HYPERPLASTIC POLYP(S). - THERE IS NO EVIDENCE OF MALIGNANCY. 3. Surgical [P], gastric antrum and gastric body - CHRONIC INACTIVE GASTRITIS. - THERE IS NO EVIDENCE OF HELICOBACTER-PYLORI, DYSPLASIA, OR MALIGNANCY. - SEE COMMENT. 4. Surgical [P], colon, sigmoid, splenic flexure, transverse and ascending, polyp (9) - TUBULAR ADENOMA(S). - SESSILE SERRATED POLYP WITHOUT CYTOLOGIC DYSPLASIA. - HIGH GRADE DYSPLASIA IS NOT IDENTIFIED. 5. Surgical [P], colon, sigmoid, polyp (2) - HYPERPLASTIC POLYP(S). - THERE IS NO EVIDENCE OF MALIGNANCY.   09/23/2018 Cancer Staging   Staging form: Intrahepatic Bile  Duct, AJCC 8th Edition - Clinical stage from 09/23/2018: Stage IB (cT1b, cN0, cM0) - Signed  by [name], MD on 10/06/2018   09/29/2018 PET scan   PET 09/29/18 IMPRESSION: 1. Hypermetabolic mass in the RIGHT hepatic lobe consistent with biopsy proven adenocarcinoma. No additional liver metastasis. 2. No evidence of local breast cancer recurrence in the RIGHT breast or RIGHT axilla. 3. Mild bilateral hypermetabolic adrenal glands is favored benign hyperplasia. 4. No evidence of additional metastatic disease on skull base to thigh FDG PET scan.   11/06/2018 Genetic Testing   Negative genetic testing on the [employer] Common Hereditary Cancers panel. A variant of uncertain significance was identified in one of her APC genes, called c.1243G>A (p.Ala415Thr).  The Common Hereditary Cancers Panel offered by [employer] includes sequencing and/or deletion duplication testing of the following 48 genes: APC, ATM, AXIN2, BARD1, BMPR1A, BRCA1, BRCA2, BRIP1, CDH1, CDK4, CDKN2A (p14ARF), CDKN2A (p16INK4a), CHEK2, CTNNA1, DICER1, EPCAM (Deletion/duplication testing only), GREM1 (promoter region deletion/duplication testing only), KIT, MEN1, MLH1, MSH2, MSH3, MSH6, MUTYH, NBN, NF1, NHTL1, PALB2, PDGFRA, PMS2, POLD1, POLE, PTEN, RAD50, RAD51C, RAD51D, RNF43, SDHB, SDHC, SDHD, SMAD4, SMARCA4. STK11, TP53, TSC1, TSC2, and VHL.  The following genes were evaluated for sequence changes only: SDHA and HOXB13 c.251G>A variant only.    11/17/2018 Pathology Results   Diagnosis 11/17/18 1. Soft tissue, biopsy, Diaphragmatic nodules - METASTATIC ADENOCARCINOMA, CONSISTENT WITH PATIENT'S CLINICAL HISTORY OF CHOLANGIOCARCINOMA. SEE NOTE 2. Liver, biopsy, Left - LIVER PARENCHYMA WITH A BENIGN FIBROTIC NODULE - NO EVIDENCE OF MALIGNANCY 3. Stomach, biopsy - BENIGN PAPILLARY MESOTHELIAL HYPERPLASIA - NO EVIDENCE OF MALIGNANCY   11/29/2018 Imaging   CT CAP WO Contrast  IMPRESSION: 1. Dominant liver mass appears grossly stable from  09/29/2018. Additional liver lesions are too small to characterize but were not shown to be hypermetabolic on PET. 2. Mild nodularity of both adrenal glands with associated hypermetabolism on 09/29/2018. Continued attention on follow-up exams is warranted. 3. Small right lower lobe nodules, stable from 09/07/2018. Again, attention on follow-up is recommended. 4. Trace bilateral pleural fluid. 5. Aortic atherosclerosis (ICD10-170.0). Coronary artery calcification. 6. Enlarged pulmonic trunk, indicative of pulmonary arterial hypertension.     11/30/2018 - 06/14/2019 Chemotherapy   First line chemo Oxaliplatin and gemcitabine q2weeks starting 11/30/18. Stopped Oxaliplatin on 05/03/19 due to worsening neuropathy. Added Cisplatin with C13 on 05/17/19 and stopped on C15 06/14/19 due to neuropathy. Reduced to maintenance single agent Gemcitabine on 06/28/19.    01/20/2019 Imaging   CT CAP IMPRESSION: restaging  1. Mild interval increase in size of dominant liver mass involving segment 4 and segment 5. 2. No significant or progressive adrenal nodularity identified to suggest metastatic disease. 3. Unchanged appearance of small right lower lobe and lingular lung nodules, nonspecific.   03/21/2019 PET scan   IMPRESSION: 1. Large right hepatic mass with SUV uptake near background hepatic activity, dramatic response to therapy, also with decrease in size when compared to the prior study. 2. Signs of prior right mastectomy and axillary dissection as before. 3. Left adrenal activity in no longer above the level of activity seen in the contralateral, right adrenal gland or in the liver. Attention on follow-up. 4. No new signs of disease.   06/27/2019 PET scan   IMPRESSION: 1. Further decrease in size of a dominant hepatic mass which is non FDG avid. 2. No evidence of metastatic disease. 3. No evidence of left adrenal hypermetabolism or mass. 4. Incidental findings, including: Uterine fibroids.  Coronary artery atherosclerosis. Aortic Atherosclerosis (ICD10-I70.0). Pulmonary artery enlargement suggests pulmonary arterial hypertension.   06/28/2019 -  Chemotherapy   Maintenance single  agent Gemcitabine q2weeks starting on 06/28/19 with C16.  ------On chemo break since 12/27/19.  ------She restarted Single Agent Gemcitabine q2weeks on 04/03/20 due to mild disease progression in liver on 02/2020 PET    09/15/2019 PET scan   IMPRESSION: 1. In the region of regional tumor at the junction of the right and left hepatic lobe, there is continued hypodensity and overall activity slightly less than that of the surrounding normal liver, indicating effective response to therapy. No current worrisome hypermetabolic lesion is identified in the liver or elsewhere. 2. Chronic asymmetric edema in the subcutaneous tissues of the right upper extremity, nonspecific. The patient has had a prior right mastectomy and right axillary dissection. 3. Other imaging findings of potential clinical significance: Aortic Atherosclerosis (ICD10-I70.0). Coronary atherosclerosis. Mild cardiomegaly. Small right and trace left pleural effusions. Subcutaneous and mild mesenteric edema. Suspected uterine fibroids.   12/18/2019 PET scan   IMPRESSION: 1. No significant abnormal activity to suggest active/recurrent malignancy. 2. Lingual tonsillar activity is mildly increased from prior but probably incidental/physiologic, attention on follow up suggested. 3. Other imaging findings of potential clinical significance: Third spacing of fluid with trace ascites, mesenteric edema, subcutaneous edema, and possibly subtle pulmonary edema. Mild cardiomegaly. Aortic Atherosclerosis (ICD10-I70.0). Coronary atherosclerosis. Right ovarian dermoid. Calcified uterine fibroids. Renal cysts. Right glenohumeral arthropathy. Mild to moderate scoliosis. Suspected pulmonary arterial hypertension.   03/14/2020 PET scan   IMPRESSION: 1.  New small focus of hypermetabolism in segment 4A of the liver worrisome for new metastatic focus. 2. No other findings for metastatic disease involving the neck, chest, abdomen or pelvis.     06/11/2020 PET scan   IMPRESSION: 1. No suspicious hypermetabolic activity within the neck, chest, abdomen or pelvis. 2. The treated dominant peripheral cholangiocarcinoma appears slightly enlarged compared with recent prior studies, and there is a questionable new lesion more superiorly in the left lobe. Although without associated hypermetabolic activity, these lesions are suspicious and would be better assessed with abdominal MRI without and with contrast.      CURRENT THERAPY:  Maintenance single agent Gemcitabine q2weeks starting on 06/28/19 with C16.On chemo break since 12/27/19.She restarted Single Agent Gemcitabine q2weeks on 04/03/20 due to mild disease progression.  INTERVAL HISTORY:  Donna May is here for a follow up. She was last seen by me 06/12/20. She presents to the clinic alone. I reviewed her medication list with her. She notes she is doing fine. She denies any new major changes. She notes she tolerated her previous cycle chemo well. She feels overall stable. She notes she is interested in her labs being explained to her every treatment. She notes she is interested in seeing an open MRI to see if she is willing to proceed with getting one to evaluate her liver lesion. She notes her neuropathy is mostly stable and worse at times. She denies recent fall. She ambulates with cane. She is interested in seeing a neurologist. She notes her BM are normal, but she notes very active bowel sounds.    REVIEW OF SYSTEMS:   Constitutional: Denies fevers, chills or abnormal weight loss Eyes: Denies blurriness of vision Ears, nose, mouth, throat, and face: Denies mucositis or sore throat Respiratory: Denies cough, dyspnea or wheezes Cardiovascular: Denies palpitation, chest discomfort or  lower extremity swelling Gastrointestinal:  Denies nausea, heartburn or change in bowel habits Skin: Denies abnormal skin rashes Lymphatics: Denies new lymphadenopathy or easy bruising Neurological: (+) Stable neuropathy with tingling and numbness in hands and feet Behavioral/Psych: Mood is stable, no  new changes  All other systems were reviewed with the patient and are negative.  MEDICAL HISTORY:  Past Medical History:  Diagnosis Date  . Anemia   . Anxiety   . Arthritis   . Breast cancer ([place]) 1992  . Cataract    Bilateral eyes - surgery to remove  . Chronic diastolic CHF (congestive heart failure) ([place])   . Chronic kidney disease    CKD stage 3  . Complication of anesthesia    "something they use make me itch for a couple of days."  . Depression   . Diabetes mellitus    type 2  . Duodenitis 01/18/2002  . Fainting spell   . GERD (gastroesophageal reflux disease)   . Heart murmur    never has caused any problems  . Hiatal hernia 08/08/2008, 01/18/2002  . History of pneumonia    x 2  . Hyperlipidemia   . Hypertension   . Liver cancer ([place]) 08/2018  . Lymphedema 2017   Right arm    SURGICAL HISTORY: Past Surgical History:  Procedure Laterality Date  . AXILLARY SURGERY     cyst removal, right  . COLONOSCOPY  09/23/2018   Dr. Havery Moros - polyps  . EYE SURGERY Bilateral    cataracts to remove  . LAPAROSCOPY N/A 11/17/2018   Procedure: LAPAROSCOPY DIAGNOSTIC, INTRAOPERATIVE ULTRASOUND, PERITONEAL BIOPSIES;  Surgeon: [name], MD;  Location: [facility];  Service: General;  Laterality: N/A;  GENERAL AND EPIDURAL  . LIVER BIOPSY  08/2018   Dr. Lindwood Coke  . MASTECTOMY  1992   right, with flap  . NM MYOCAR PERF WALL MOTION  06/11/2009   Protocol:[place], post stress EF58%, EKG negative for ischemia, low risk  . PORTACATH PLACEMENT N/A 12/02/2018   Procedure: INSERTION PORT-A-CATH;  Surgeon: [name], MD;  Location: [facility] [place];  Service: General;  Laterality: N/A;  .  RECONSTRUCTION BREAST W/ TRAM FLAP Right   . TONSILLECTOMY    . TRANSTHORACIC ECHOCARDIOGRAM  12/24/2009   LVEF =>55%, normal study  . UPPER GI ENDOSCOPY      I have reviewed the social history and family history with the patient and they are unchanged from previous note.  ALLERGIES:  has No Known Allergies.  MEDICATIONS:  Current Outpatient Medications  Medication Sig Dispense Refill  . amLODipine (NORVASC) 10 MG tablet Take 1 tablet (10 mg total) by mouth daily. 90 tablet 3  . aspirin 81 MG tablet Take 81 mg by mouth daily.     . carvedilol (COREG) 25 MG tablet Take 1 tablet (25 mg total) by mouth 2 (two) times daily with a meal. 180 tablet 3  . furosemide (LASIX) 40 MG tablet Take 1 tablet (40 mg total) by mouth daily. 30 tablet 11  . gabapentin (NEURONTIN) 100 MG capsule Take 1 capsule (100 mg total) by mouth every 8 (eight) hours as needed. 180 capsule 2  . glucose blood test strip 1 each by Other route 2 (two) times daily. Use Onetouch verio test strips as instructed to check blood sugar twice daily. 100 each 2  . hydrALAZINE (APRESOLINE) 100 MG tablet TAKE THREE TIMES DAILY-AM, MIDDLE OF THE DAY AND IN THE EVENING. 90 tablet 3  . KLOR-CON M10 10 MEQ tablet TAKE 1 TABLET BY MOUTH EVERY DAY 90 tablet 1  . lansoprazole (PREVACID) 15 MG capsule Take 1 capsule (15 mg total) by mouth daily. 90 capsule 1  . loratadine (CLARITIN) 10 MG tablet Take 10 mg by mouth daily as needed for allergies.    Marland Kitchen  Omega 3 1000 MG CAPS Take 2,000 mg by mouth daily.     . rosuvastatin (CRESTOR) 20 MG tablet Take 1 tablet (20 mg total) by mouth daily. 90 tablet 3  . sitaGLIPtin (JANUVIA) 100 MG tablet Take 1 tablet (100 mg total) by mouth daily. 90 tablet 3  . valsartan (DIOVAN) 320 MG tablet Take 1 tablet (320 mg total) by mouth daily. 90 tablet 3   Current Facility-Administered Medications  Medication Dose Route Frequency Provider Last Rate Last Admin  . triamcinolone acetonide (KENALOG) 10 MG/ML  injection 10 mg  10 mg Other Once Harriet Masson, DPM       Facility-Administered Medications Ordered in Other Visits  Medication Dose Route Frequency Provider Last Rate Last Admin  . sodium chloride flush (NS) 0.9 % injection 10 mL  10 mL Intracatheter PRN [name], MD   10 mL at 06/26/20 1148    PHYSICAL EXAMINATION: ECOG PERFORMANCE STATUS: 2 - Symptomatic, <50% confined to bed  Vitals:   06/26/20 0944  BP: (!) 185/74  Pulse: 67  Temp: (!) 96.4 F (35.8 C)  SpO2: 100%   Filed Weights   06/26/20 0944  Weight: 127 lb 3.2 oz (57.7 kg)    GENERAL:alert, no distress and comfortable SKIN: skin color, texture, turgor are normal, no rashes or significant lesions EYES: normal, Conjunctiva are pink and non-injected, sclera clear  NECK: supple, thyroid normal size, non-tender, without nodularity LYMPH:  no palpable lymphadenopathy in the cervical, axillary  LUNGS: clear to auscultation and percussion with normal breathing effort HEART: regular rate & rhythm and no murmurs and no lower extremity edema ABDOMEN:abdomen soft, non-tender and normal bowel sounds (+) Very mild RUQ tenderness, no hepatomegaly Musculoskeletal:no cyanosis of digits and no clubbing  NEURO: alert & oriented x 3 with fluent speech, no focal motor/sensory deficits  LABORATORY DATA:  I have reviewed the data as listed CBC Latest Ref Rng & Units 06/26/2020 06/12/2020 06/05/2020  WBC 4.0 - 10.5 K/uL 5.9 4.9 2.7(L)  Hemoglobin 12.0 - 15.0 g/dL 8.7(L) 8.6(L) 7.9(L)  Hematocrit 36.0 - 46.0 % 26.6(L) 27.2(L) 24.5(L)  Platelets 150 - 400 K/uL 155 170 190     CMP Latest Ref Rng & Units 06/26/2020 06/12/2020 05/29/2020  Glucose 70 - 99 mg/dL 104(H) 114(H) 90  BUN 8 - 23 mg/dL 51(H) 35(H) 38(H)  Creatinine 0.44 - 1.00 mg/dL 1.70(H) 1.42(H) 1.59(H)  Sodium 135 - 145 mmol/L 142 142 141  Potassium 3.5 - 5.1 mmol/L 4.8 4.3 4.6  Chloride 98 - 111 mmol/L 108 110 109  CO2 22 - 32 mmol/L $RemoveB'24 23 25  'vdZxyGcv$ Calcium 8.9 - 10.3 mg/dL 9.1  8.5(L) 8.5(L)  Total Protein 6.5 - 8.1 g/dL 7.0 6.9 6.7  Total Bilirubin 0.3 - 1.2 mg/dL 0.3 0.3 0.2(L)  Alkaline Phos 38 - 126 U/L 54 63 54  AST 15 - 41 U/L $Remo'18 18 19  'EaKgR$ ALT 0 - 44 U/L $Remo'8 12 12      'GnVve$ RADIOGRAPHIC STUDIES: I have personally reviewed the radiological images as listed and agreed with the findings in the report. No results found.   ASSESSMENT & PLAN:  JAIMEE CORUM is a 80 y.o. female with   1.Intrahepatic cholangiocarcinoma, cT1N0M1,with peritoneal metastasis, MSS, IDH1 mutation (+) -She was diagnosed in 08/2018. Her biopsy of her liver mass shows adenocarcinoma,most consistent withcholangiocarcinoma.Her EGD/Colonoscopy from 7/10/20was negativeand 09/29/18 PET scanshowedno evidence of distant metastasis. -She was brought to OR on 11/17/18,unfortunately she was found to have peritoneal metastasis to right diaphragm and  surgery was aborted. -Given her metastatic cancer, Istarted her onsystemicchemo withFirst line chemoOxaliplatinand gemcitabine q2weeksto control her diseasebeginningon 11/30/18.Due to poor tolerance to Oxaliplatin and Cisplatin from neuropathy, she proceeded withsingleagent Gemcitabineevery 2 weeksstarting 06/28/19 with C16.Shewas onchemo break since10/2021. -GivenPET from 03/14/20 showedconcern for new liver lesion, I restarted her onmaintenance Gemcitabine q2 weeksbeginning1/19/22. -Her PET images from 06/11/20 showed her prior hypermetabolic liver lesion is no longer hypermetabolic on PET, but present and slightly larger in size. No new lesions. I recommended liver MRI to further evaluate liver lesion. Given her reluctance for MRIs, will hold on it for now.  -Given indeterminate growth of her liver lesion, I discussed the option of adding immunotherapy with Duvantinib, based on the recent TOPAZ-1 phase 3 trial data. She is concerned about the potential side effect, especially skin toxicity.  We will hold it for now. -She is overall stable  on maintenance Gemcitabine, with no new concerns. Labs reviewed and stable. HG 8.7, BG 104, BUN 51, Cr 1.70. I personally reviewed recent labs with patient today. Overall adequate to proceed with Gemcitabine today at same dose.  -F/u in 2 weeks   2. Anemia,Secondary to chemoand cancer -Shepreviouslyrequiredblood transfusionsince 03/2019, continue as needed. Will continue prenatal vitamin. -Last blood transfusion10/25/21.  -Hg has been stable in 9 rangeoff chemo. With restart of chemo, Hg has dropped to [place].If she is symptomatic or labs drop below 8, will give blood transfusion. -Stable and moderate recently.   3.Peripheral neuropathy, G3, Secondary to Oxaliplatinand Cisplatin (both d/c) -Due to low tolerance and not much help, she stopped Lyrica,Gabapentinand Cymbalta. -She waspreviouslyseen by Dr Mickeal Skinner.She is now being seen byChiropractor Dr Page Spiro. -She remains to have decreased function overall.Stablewith occasional flares.  -Will monitor with restart of Gemcitabine.Stable so far. -ContinueGabapentin 100mg  to use at night as needed. She may consult with a neurologist soon.   4. H/o right breast cancer, Geneticsnegative, Osteopenia -s/prightmastectomy in 1992, per patient did not require adjuvant therapy. She was last seen by Dr. Jana Hakim in 2017. -Her4/2019 DEXA shows mild osteopenia. F/u with Gyn about continuing Raloxifene. I do not think she has to continue at this point.  5.Comorbidities:DM,HTN,hiatal hernia, GERD, renal artery stenosis,2021pulmonary edema -Continue medications and f/u with PCP and cardiologist for management. -She is on torsemide twice a week, and hydrochlorothiazide daily  6. Family support, Goal of Care Discussion -She has a son and daughterwho are often busy but help as needed. -The patient understands the goal of care is palliative.She is full code now  7.CKD stage III -Continue to  monitor.Stable Cr at 1.2-15.  PLAN: -Labs reviewed and adequate to proceedwithGemcitabine today at same dose.  -Lab, Flush, F/u and Gemcitabine in2, 4, 6 weeks    No problem-specific Assessment & Plan notes found for this encounter.   No orders of the defined types were placed in this encounter.  All questions were answered. The patient knows to call the clinic with any problems, questions or concerns. No barriers to learning was detected. The total time spent in the appointment was 30 minutes.     [name], MD 06/26/2020   I, Joslyn Devon, am acting as scribe for [name], MD.   I have reviewed the above documentation for accuracy and completeness, and I agree with the above.

## 2020-06-26 ENCOUNTER — Inpatient Hospital Stay: Payer: Medicare Other

## 2020-06-26 ENCOUNTER — Other Ambulatory Visit: Payer: Self-pay

## 2020-06-26 ENCOUNTER — Inpatient Hospital Stay (HOSPITAL_BASED_OUTPATIENT_CLINIC_OR_DEPARTMENT_OTHER): Payer: Medicare Other | Admitting: Hematology

## 2020-06-26 ENCOUNTER — Encounter: Payer: Self-pay | Admitting: Hematology

## 2020-06-26 ENCOUNTER — Inpatient Hospital Stay: Payer: Medicare Other | Attending: Adult Health

## 2020-06-26 VITALS — BP 159/62 | Resp 16

## 2020-06-26 VITALS — BP 185/74 | HR 67 | Temp 96.4°F | Ht 65.0 in | Wt 127.2 lb

## 2020-06-26 DIAGNOSIS — Z5111 Encounter for antineoplastic chemotherapy: Secondary | ICD-10-CM | POA: Diagnosis not present

## 2020-06-26 DIAGNOSIS — C221 Intrahepatic bile duct carcinoma: Secondary | ICD-10-CM | POA: Insufficient documentation

## 2020-06-26 DIAGNOSIS — C786 Secondary malignant neoplasm of retroperitoneum and peritoneum: Secondary | ICD-10-CM | POA: Diagnosis not present

## 2020-06-26 DIAGNOSIS — D649 Anemia, unspecified: Secondary | ICD-10-CM

## 2020-06-26 DIAGNOSIS — D6481 Anemia due to antineoplastic chemotherapy: Secondary | ICD-10-CM | POA: Diagnosis not present

## 2020-06-26 DIAGNOSIS — Z95828 Presence of other vascular implants and grafts: Secondary | ICD-10-CM

## 2020-06-26 DIAGNOSIS — I248 Other forms of acute ischemic heart disease: Secondary | ICD-10-CM

## 2020-06-26 LAB — CMP (CANCER CENTER ONLY)
ALT: 8 U/L (ref 0–44)
AST: 18 U/L (ref 15–41)
Albumin: 3.6 g/dL (ref 3.5–5.0)
Alkaline Phosphatase: 54 U/L (ref 38–126)
Anion gap: 10 (ref 5–15)
BUN: 51 mg/dL — ABNORMAL HIGH (ref 8–23)
CO2: 24 mmol/L (ref 22–32)
Calcium: 9.1 mg/dL (ref 8.9–10.3)
Chloride: 108 mmol/L (ref 98–111)
Creatinine: 1.7 mg/dL — ABNORMAL HIGH (ref 0.44–1.00)
GFR, Estimated: 30 mL/min — ABNORMAL LOW (ref >60)
Glucose, Bld: 104 mg/dL — ABNORMAL HIGH (ref 70–99)
Potassium: 4.8 mmol/L (ref 3.5–5.1)
Sodium: 142 mmol/L (ref 135–145)
Total Bilirubin: 0.3 mg/dL (ref 0.3–1.2)
Total Protein: 7 g/dL (ref 6.5–8.1)

## 2020-06-26 LAB — CBC WITH DIFFERENTIAL (CANCER CENTER ONLY)
Abs Immature Granulocytes: 0.03 10*3/uL (ref 0.00–0.07)
Basophils Absolute: 0 10*3/uL (ref 0.0–0.1)
Basophils Relative: 1 %
Eosinophils Absolute: 0.3 10*3/uL (ref 0.0–0.5)
Eosinophils Relative: 4 %
HCT: 26.6 % — ABNORMAL LOW (ref 36.0–46.0)
Hemoglobin: 8.7 g/dL — ABNORMAL LOW (ref 12.0–15.0)
Immature Granulocytes: 1 %
Lymphocytes Relative: 26 %
Lymphs Abs: 1.5 10*3/uL (ref 0.7–4.0)
MCH: 33.1 pg (ref 26.0–34.0)
MCHC: 32.7 g/dL (ref 30.0–36.0)
MCV: 101.1 fL — ABNORMAL HIGH (ref 80.0–100.0)
Monocytes Absolute: 1.1 10*3/uL — ABNORMAL HIGH (ref 0.1–1.0)
Monocytes Relative: 18 %
Neutro Abs: 3 10*3/uL (ref 1.7–7.7)
Neutrophils Relative %: 50 %
Platelet Count: 155 10*3/uL (ref 150–400)
RBC: 2.63 MIL/uL — ABNORMAL LOW (ref 3.87–5.11)
RDW: 18.2 % — ABNORMAL HIGH (ref 11.5–15.5)
WBC Count: 5.9 10*3/uL (ref 4.0–10.5)
nRBC: 0 % (ref 0.0–0.2)

## 2020-06-26 LAB — SAMPLE TO BLOOD BANK

## 2020-06-26 MED ORDER — SODIUM CHLORIDE 0.9 % IV SOLN
Freq: Once | INTRAVENOUS | Status: AC
  Filled 2020-06-26: qty 250

## 2020-06-26 MED ORDER — SODIUM CHLORIDE 0.9 % IV SOLN
600.0000 mg/m2 | Freq: Once | INTRAVENOUS | Status: AC
  Administered 2020-06-26: 988 mg via INTRAVENOUS
  Filled 2020-06-26: qty 25.98

## 2020-06-26 MED ORDER — HEPARIN SOD (PORK) LOCK FLUSH 100 UNIT/ML IV SOLN
500.0000 [IU] | Freq: Once | INTRAVENOUS | Status: AC | PRN
  Administered 2020-06-26: 500 [IU]
  Filled 2020-06-26: qty 5

## 2020-06-26 MED ORDER — SODIUM CHLORIDE 0.9% FLUSH
10.0000 mL | INTRAVENOUS | Status: DC | PRN
Start: 2020-06-26 — End: 2020-06-26
  Administered 2020-06-26: 10 mL
  Filled 2020-06-26: qty 10

## 2020-06-26 MED ORDER — SODIUM CHLORIDE 0.9% FLUSH
10.0000 mL | Freq: Once | INTRAVENOUS | Status: AC
  Administered 2020-06-26: 10 mL
  Filled 2020-06-26: qty 10

## 2020-06-26 MED ORDER — PROCHLORPERAZINE MALEATE 10 MG PO TABS
ORAL_TABLET | ORAL | Status: AC
  Filled 2020-06-26: qty 1

## 2020-06-26 MED ORDER — PROCHLORPERAZINE MALEATE 10 MG PO TABS
10.0000 mg | ORAL_TABLET | Freq: Once | ORAL | Status: AC
Start: 2020-06-26 — End: 2020-06-26
  Administered 2020-06-26: 10 mg via ORAL

## 2020-06-26 MED ORDER — SODIUM CHLORIDE 0.9 % IV SOLN
600.0000 mg/m2 | Freq: Once | INTRAVENOUS | Status: DC
  Filled 2020-06-26: qty 26

## 2020-06-26 NOTE — Patient Instructions (Signed)
[facility] °Discharge Instructions for Patients Receiving Chemotherapy ° °Today you received the following chemotherapy agents Gemzar ° °To help prevent nausea and vomiting after your treatment, we encourage you to take your nausea medication as directed. °  °If you develop nausea and vomiting that is not controlled by your nausea medication, call the clinic.  ° °BELOW ARE SYMPTOMS THAT SHOULD BE REPORTED IMMEDIATELY: °· *FEVER GREATER THAN 100.5 F °· *CHILLS WITH OR WITHOUT FEVER °· NAUSEA AND VOMITING THAT IS NOT CONTROLLED WITH YOUR NAUSEA MEDICATION °· *UNUSUAL SHORTNESS OF BREATH °· *UNUSUAL BRUISING OR BLEEDING °· TENDERNESS IN MOUTH AND THROAT WITH OR WITHOUT PRESENCE OF ULCERS °· *URINARY PROBLEMS °· *BOWEL PROBLEMS °· UNUSUAL RASH °Items with * indicate a potential emergency and should be followed up as soon as possible. ° °Feel free to call the clinic should you have any questions or concerns. The clinic phone number is [phone]. ° °Please show the CHEMO ALERT CARD at check-in to the Emergency Department and triage nurse. ° ° °

## 2020-06-26 NOTE — Progress Notes (Signed)
Per dr Burr Medico, ok to treat with creatinine of 1.7

## 2020-06-28 ENCOUNTER — Telehealth: Payer: Self-pay | Admitting: Hematology

## 2020-06-28 NOTE — Telephone Encounter (Signed)
Pt called in to r/s appts to Wednesdays per 4/15 sch msg. I spoke to pt and let her know that appt in April could not be moved due to provider availability. Moved May appt per pt request.

## 2020-07-11 ENCOUNTER — Ambulatory Visit: Payer: Medicare Other

## 2020-07-11 ENCOUNTER — Other Ambulatory Visit: Payer: Medicare Other

## 2020-07-11 ENCOUNTER — Inpatient Hospital Stay: Payer: Medicare Other

## 2020-07-11 ENCOUNTER — Telehealth: Payer: Self-pay

## 2020-07-11 ENCOUNTER — Inpatient Hospital Stay (HOSPITAL_BASED_OUTPATIENT_CLINIC_OR_DEPARTMENT_OTHER): Payer: Medicare Other | Admitting: Hematology

## 2020-07-11 ENCOUNTER — Ambulatory Visit: Payer: Medicare Other | Admitting: Hematology

## 2020-07-11 ENCOUNTER — Encounter: Payer: Self-pay | Admitting: Hematology

## 2020-07-11 ENCOUNTER — Other Ambulatory Visit: Payer: Self-pay

## 2020-07-11 VITALS — BP 160/59 | HR 59 | Temp 97.5°F | Resp 18 | Ht 65.0 in | Wt 126.9 lb

## 2020-07-11 DIAGNOSIS — C221 Intrahepatic bile duct carcinoma: Secondary | ICD-10-CM | POA: Insufficient documentation

## 2020-07-11 DIAGNOSIS — I248 Other forms of acute ischemic heart disease: Secondary | ICD-10-CM

## 2020-07-11 DIAGNOSIS — T451X5A Adverse effect of antineoplastic and immunosuppressive drugs, initial encounter: Secondary | ICD-10-CM

## 2020-07-11 DIAGNOSIS — Z95828 Presence of other vascular implants and grafts: Secondary | ICD-10-CM

## 2020-07-11 DIAGNOSIS — Z5111 Encounter for antineoplastic chemotherapy: Secondary | ICD-10-CM | POA: Insufficient documentation

## 2020-07-11 DIAGNOSIS — D649 Anemia, unspecified: Secondary | ICD-10-CM

## 2020-07-11 DIAGNOSIS — C786 Secondary malignant neoplasm of retroperitoneum and peritoneum: Secondary | ICD-10-CM | POA: Diagnosis not present

## 2020-07-11 DIAGNOSIS — G620 Drug-induced polyneuropathy: Secondary | ICD-10-CM | POA: Diagnosis not present

## 2020-07-11 DIAGNOSIS — D6481 Anemia due to antineoplastic chemotherapy: Secondary | ICD-10-CM | POA: Diagnosis not present

## 2020-07-11 LAB — CBC WITH DIFFERENTIAL (CANCER CENTER ONLY)
Abs Immature Granulocytes: 0.02 10*3/uL (ref 0.00–0.07)
Basophils Absolute: 0 10*3/uL (ref 0.0–0.1)
Basophils Relative: 0 %
Eosinophils Absolute: 0.3 10*3/uL (ref 0.0–0.5)
Eosinophils Relative: 6 %
HCT: 27.6 % — ABNORMAL LOW (ref 36.0–46.0)
Hemoglobin: 9.1 g/dL — ABNORMAL LOW (ref 12.0–15.0)
Immature Granulocytes: 0 %
Lymphocytes Relative: 27 %
Lymphs Abs: 1.5 10*3/uL (ref 0.7–4.0)
MCH: 33.3 pg (ref 26.0–34.0)
MCHC: 33 g/dL (ref 30.0–36.0)
MCV: 101.1 fL — ABNORMAL HIGH (ref 80.0–100.0)
Monocytes Absolute: 1 10*3/uL (ref 0.1–1.0)
Monocytes Relative: 18 %
Neutro Abs: 2.7 10*3/uL (ref 1.7–7.7)
Neutrophils Relative %: 49 %
Platelet Count: 168 10*3/uL (ref 150–400)
RBC: 2.73 MIL/uL — ABNORMAL LOW (ref 3.87–5.11)
RDW: 17.1 % — ABNORMAL HIGH (ref 11.5–15.5)
WBC Count: 5.5 10*3/uL (ref 4.0–10.5)
nRBC: 0 % (ref 0.0–0.2)

## 2020-07-11 LAB — CMP (CANCER CENTER ONLY)
ALT: 11 U/L (ref 0–44)
AST: 22 U/L (ref 15–41)
Albumin: 3.7 g/dL (ref 3.5–5.0)
Alkaline Phosphatase: 55 U/L (ref 38–126)
Anion gap: 9 (ref 5–15)
BUN: 61 mg/dL — ABNORMAL HIGH (ref 8–23)
CO2: 26 mmol/L (ref 22–32)
Calcium: 9 mg/dL (ref 8.9–10.3)
Chloride: 106 mmol/L (ref 98–111)
Creatinine: 2.09 mg/dL — ABNORMAL HIGH (ref 0.44–1.00)
GFR, Estimated: 23 mL/min — ABNORMAL LOW (ref >60)
Glucose, Bld: 92 mg/dL (ref 70–99)
Potassium: 5.1 mmol/L (ref 3.5–5.1)
Sodium: 141 mmol/L (ref 135–145)
Total Bilirubin: <0.2 mg/dL — ABNORMAL LOW (ref 0.3–1.2)
Total Protein: 7.1 g/dL (ref 6.5–8.1)

## 2020-07-11 LAB — SAMPLE TO BLOOD BANK

## 2020-07-11 MED ORDER — PROCHLORPERAZINE MALEATE 10 MG PO TABS
10.0000 mg | ORAL_TABLET | Freq: Once | ORAL | Status: AC
  Administered 2020-07-11: 10 mg via ORAL

## 2020-07-11 MED ORDER — SODIUM CHLORIDE 0.9 % IV SOLN
Freq: Once | INTRAVENOUS | Status: AC
  Filled 2020-07-11: qty 250

## 2020-07-11 MED ORDER — SODIUM CHLORIDE 0.9 % IV SOLN
600.0000 mg/m2 | Freq: Once | INTRAVENOUS | Status: AC
  Administered 2020-07-11: 988 mg via INTRAVENOUS
  Filled 2020-07-11: qty 25.98

## 2020-07-11 MED ORDER — PROCHLORPERAZINE MALEATE 10 MG PO TABS
ORAL_TABLET | ORAL | Status: AC
  Filled 2020-07-11: qty 1

## 2020-07-11 MED ORDER — SODIUM CHLORIDE 0.9 % IV SOLN: 600.0000 mg/m2 | Freq: Once | INTRAVENOUS | Status: DC

## 2020-07-11 MED ORDER — SODIUM CHLORIDE 0.9% FLUSH
10.0000 mL | Freq: Once | INTRAVENOUS | Status: AC
Start: 2020-07-11 — End: 2020-07-11
  Administered 2020-07-11: 10 mL
  Filled 2020-07-11: qty 10

## 2020-07-11 NOTE — Patient Instructions (Signed)
[facility]  Discharge Instructions: Thank you for choosing [facility] to provide your oncology and hematology care.   If you have a lab appointment with the Cancer Center, please go directly to the Cancer Center and check in at the registration area.   Wear comfortable clothing and clothing appropriate for easy access to any Portacath or PICC line.   We strive to give you quality time with your provider. You may need to reschedule your appointment if you arrive late (15 or more minutes).  Arriving late affects you and other patients whose appointments are after yours.  Also, if you miss three or more appointments without notifying the office, you may be dismissed from the clinic at the provider's discretion.      For prescription refill requests, have your pharmacy contact our office and allow 72 hours for refills to be completed.    Today you received the following chemotherapy and/or immunotherapy agents Gemzar      To help prevent nausea and vomiting after your treatment, we encourage you to take your nausea medication as directed.  BELOW ARE SYMPTOMS THAT SHOULD BE REPORTED IMMEDIATELY: *FEVER GREATER THAN 100.4 F (38 C) OR HIGHER *CHILLS OR SWEATING *NAUSEA AND VOMITING THAT IS NOT CONTROLLED WITH YOUR NAUSEA MEDICATION *UNUSUAL SHORTNESS OF BREATH *UNUSUAL BRUISING OR BLEEDING *URINARY PROBLEMS (pain or burning when urinating, or frequent urination) *BOWEL PROBLEMS (unusual diarrhea, constipation, pain near the anus) TENDERNESS IN MOUTH AND THROAT WITH OR WITHOUT PRESENCE OF ULCERS (sore throat, sores in mouth, or a toothache) UNUSUAL RASH, SWELLING OR PAIN  UNUSUAL VAGINAL DISCHARGE OR ITCHING   Items with * indicate a potential emergency and should be followed up as soon as possible or go to the Emergency Department if any problems should occur.  Please show the CHEMOTHERAPY ALERT CARD or IMMUNOTHERAPY ALERT CARD at check-in to the  Emergency Department and triage nurse.  Should you have questions after your visit or need to cancel or reschedule your appointment, please contact [facility]  Dept: [phone]  and follow the prompts.  Office hours are 8:00 a.m. to 4:30 p.m. Monday - Friday. Please note that voicemails left after 4:00 p.m. may not be returned until the following business day.  We are closed weekends and major holidays. You have access to a nurse at all times for urgent questions. Please call the main number to the clinic Dept: [phone] and follow the prompts.   For any non-urgent questions, you may also contact your provider using MyChart. We now offer e-Visits for anyone 18 and older to request care online for non-urgent symptoms. For details visit [email or web address].   Also download the MyChart app! Go to the app store, search "MyChart", open the app, select [facility], and log in with your MyChart username and password.  Due to Covid, a mask is required upon entering the hospital/clinic. If you do not have a mask, one will be given to you upon arrival. For doctor visits, patients may have 1 support person aged 18 or older with them. For treatment visits, patients cannot have anyone with them due to current Covid guidelines and our immunocompromised population.   

## 2020-07-11 NOTE — Telephone Encounter (Signed)
146 pm.  Phone call made to patient to schedule a home visit.  No answer.  Message has been left requesting a call back.  PLAN: Awaiting call back from the patient.  If no call back received, Palliative Care will outreach patient in May.

## 2020-07-11 NOTE — Progress Notes (Signed)
[place]   [phone] [phone]   Clinic Follow up Note   Patient Care Team: [name], MD as PCP - General (Family Medicine) [name], [name], MD as PCP - Cardiology (Cardiology) [name], [name], MD as Consulting Physician (Cardiology) [name], MD as Consulting Physician (Obstetrics and Gynecology) [name], [name], NP as Nurse Practitioner (Hematology and Oncology) [facility] [name], MD as Consulting Physician (Hematology) [name], [name], MD as Consulting Physician (Gastroenterology) Virgina Evener, Dawn, RN (Inactive) as Oncology Nurse Navigator [name], MD as Consulting Physician (General Surgery)  Date of Service:  07/11/2020  CHIEF COMPLAINT: f/u of cholangiocarcinomaof liver  SUMMARY OF ONCOLOGIC HISTORY: Oncology History Overview Note  Cancer Staging Intrahepatic cholangiocarcinoma ([place]) Staging form: Intrahepatic Bile Duct, AJCC 8th Edition - Clinical stage from 09/23/2018: Stage IB (cT1b, cN0, cM0) - Signed by [name], MD on 10/06/2018    Intrahepatic cholangiocarcinoma ([place])  09/07/2018 Imaging   CT Chest IMPRESSION: 1. New, enhancing mass involving segment 4 of the liver and fundus of gallbladder is concerning for malignancy. This may represent either metastatic disease from breast cancer or neoplasm primary to the liver or hepatic biliary tree. Further evaluation with contrast enhanced CT of the abdomen and pelvis is recommended. 2. No findings to suggest metastatic disease within the chest. 3.  Aortic Atherosclerosis (ICD10-I70.0). 4. Coronary artery calcifications.   09/13/2018 Pathology Results   Diagnosis Liver, needle/core biopsy - ADENOCARCINOMA. Microscopic Comment Immunohistochemistry for CK7 is positive. CK20, TTF1, CDX-2, GATA-3, PAX 8, Qualitative ER, p63 and CK5/6 are negative. The provided clinical history of remote mammary carcinoma is noted. Based on the  morphology and immunophenotype of the adenocarcinoma observed in this specimen, primary cholangiocarcinoma is favored. Clinical and radiologic correlation are  encouraged. Results reported to [employer] on 09/15/2018. Intradepartmental consultation (Dr. Vic Ripper).   09/13/2018 Initial Diagnosis   Cholangiocarcinoma ([place])   09/23/2018 Procedure   Colonoscopy by Dr. Havery Moros 09/23/18  IMPRESSION - Two 3 to 4 mm polyps in the ascending colon, removed with a cold snare. Resected and retrieved. - Five 3 to 5 mm polyps in the transverse colon, removed with a cold snare. Resected and retrieved. - One 5 mm polyp at the splenic flexure, removed with a cold snare. Resected and retrieved. - Three 3 to 5 mm polyps in the sigmoid colon, removed with a cold snare. Resected and retrieved. - The examination was otherwise normal. Upper Endopscy by Dr. Havery Moros 09/23/18  IMPRESSION - Esophagogastric landmarks identified. - 2 cm hiatal hernia. - Normal esophagus otherwise. - A single gastric polyp. Resected and retrieved. - Mild gastritis. Biopsied. - Normal duodenal bulb and second portion of the duodenum.   09/23/2018 Pathology Results   Diagnosis 09/23/18 1. Surgical [P], duodenum - BENIGN SMALL BOWEL MUCOSA. - NO ACTIVE INFLAMMATION OR VILLOUS ATROPHY IDENTIFIED. 2. Surgical [P], stomach, polyp - HYPERPLASTIC POLYP(S). - THERE IS NO EVIDENCE OF MALIGNANCY. 3. Surgical [P], gastric antrum and gastric body - CHRONIC INACTIVE GASTRITIS. - THERE IS NO EVIDENCE OF HELICOBACTER-PYLORI, DYSPLASIA, OR MALIGNANCY. - SEE COMMENT. 4. Surgical [P], colon, sigmoid, splenic flexure, transverse and ascending, polyp (9) - TUBULAR ADENOMA(S). - SESSILE SERRATED POLYP WITHOUT CYTOLOGIC DYSPLASIA. - HIGH GRADE DYSPLASIA IS NOT IDENTIFIED. 5. Surgical [P], colon, sigmoid, polyp (2) - HYPERPLASTIC POLYP(S). - THERE IS NO EVIDENCE OF MALIGNANCY.   09/23/2018 Cancer Staging   Staging form: Intrahepatic Bile  Duct, AJCC 8th Edition - Clinical stage from 09/23/2018: Stage IB (cT1b, cN0, cM0) -  Signed by [name], MD on 10/06/2018   09/29/2018 PET scan   PET 09/29/18 IMPRESSION: 1. Hypermetabolic mass in the RIGHT hepatic lobe consistent with biopsy proven adenocarcinoma. No additional liver metastasis. 2. No evidence of local breast cancer recurrence in the RIGHT breast or RIGHT axilla. 3. Mild bilateral hypermetabolic adrenal glands is favored benign hyperplasia. 4. No evidence of additional metastatic disease on skull base to thigh FDG PET scan.   11/06/2018 Genetic Testing   Negative genetic testing on the [employer] Common Hereditary Cancers panel. A variant of uncertain significance was identified in one of her APC genes, called c.1243G>A (p.Ala415Thr).  The Common Hereditary Cancers Panel offered by [employer] includes sequencing and/or deletion duplication testing of the following 48 genes: APC, ATM, AXIN2, BARD1, BMPR1A, BRCA1, BRCA2, BRIP1, CDH1, CDK4, CDKN2A (p14ARF), CDKN2A (p16INK4a), CHEK2, CTNNA1, DICER1, EPCAM (Deletion/duplication testing only), GREM1 (promoter region deletion/duplication testing only), KIT, MEN1, MLH1, MSH2, MSH3, MSH6, MUTYH, NBN, NF1, NHTL1, PALB2, PDGFRA, PMS2, POLD1, POLE, PTEN, RAD50, RAD51C, RAD51D, RNF43, SDHB, SDHC, SDHD, SMAD4, SMARCA4. STK11, TP53, TSC1, TSC2, and VHL.  The following genes were evaluated for sequence changes only: SDHA and HOXB13 c.251G>A variant only.    11/17/2018 Pathology Results   Diagnosis 11/17/18 1. Soft tissue, biopsy, Diaphragmatic nodules - METASTATIC ADENOCARCINOMA, CONSISTENT WITH PATIENT'S CLINICAL HISTORY OF CHOLANGIOCARCINOMA. SEE NOTE 2. Liver, biopsy, Left - LIVER PARENCHYMA WITH A BENIGN FIBROTIC NODULE - NO EVIDENCE OF MALIGNANCY 3. Stomach, biopsy - BENIGN PAPILLARY MESOTHELIAL HYPERPLASIA - NO EVIDENCE OF MALIGNANCY   11/29/2018 Imaging   CT CAP WO Contrast  IMPRESSION: 1. Dominant liver mass appears grossly stable from  09/29/2018. Additional liver lesions are too small to characterize but were not shown to be hypermetabolic on PET. 2. Mild nodularity of both adrenal glands with associated hypermetabolism on 09/29/2018. Continued attention on follow-up exams is warranted. 3. Small right lower lobe nodules, stable from 09/07/2018. Again, attention on follow-up is recommended. 4. Trace bilateral pleural fluid. 5. Aortic atherosclerosis (ICD10-170.0). Coronary artery calcification. 6. Enlarged pulmonic trunk, indicative of pulmonary arterial hypertension.     11/30/2018 - 06/14/2019 Chemotherapy   First line chemo Oxaliplatin and gemcitabine q2weeks starting 11/30/18. Stopped Oxaliplatin on 05/03/19 due to worsening neuropathy. Added Cisplatin with C13 on 05/17/19 and stopped on C15 06/14/19 due to neuropathy. Reduced to maintenance single agent Gemcitabine on 06/28/19.    01/20/2019 Imaging   CT CAP IMPRESSION: restaging  1. Mild interval increase in size of dominant liver mass involving segment 4 and segment 5. 2. No significant or progressive adrenal nodularity identified to suggest metastatic disease. 3. Unchanged appearance of small right lower lobe and lingular lung nodules, nonspecific.   03/21/2019 PET scan   IMPRESSION: 1. Large right hepatic mass with SUV uptake near background hepatic activity, dramatic response to therapy, also with decrease in size when compared to the prior study. 2. Signs of prior right mastectomy and axillary dissection as before. 3. Left adrenal activity in no longer above the level of activity seen in the contralateral, right adrenal gland or in the liver. Attention on follow-up. 4. No new signs of disease.   06/27/2019 PET scan   IMPRESSION: 1. Further decrease in size of a dominant hepatic mass which is non FDG avid. 2. No evidence of metastatic disease. 3. No evidence of left adrenal hypermetabolism or mass. 4. Incidental findings, including: Uterine fibroids.  Coronary artery atherosclerosis. Aortic Atherosclerosis (ICD10-I70.0). Pulmonary artery enlargement suggests pulmonary arterial hypertension.   06/28/2019 -  Chemotherapy   Maintenance  single agent Gemcitabine q2weeks starting on 06/28/19 with C16.  ------On chemo break since 12/27/19.  ------She restarted Single Agent Gemcitabine q2weeks on 04/03/20 due to mild disease progression in liver on 02/2020 PET    09/15/2019 PET scan   IMPRESSION: 1. In the region of regional tumor at the junction of the right and left hepatic lobe, there is continued hypodensity and overall activity slightly less than that of the surrounding normal liver, indicating effective response to therapy. No current worrisome hypermetabolic lesion is identified in the liver or elsewhere. 2. Chronic asymmetric edema in the subcutaneous tissues of the right upper extremity, nonspecific. The patient has had a prior right mastectomy and right axillary dissection. 3. Other imaging findings of potential clinical significance: Aortic Atherosclerosis (ICD10-I70.0). Coronary atherosclerosis. Mild cardiomegaly. Small right and trace left pleural effusions. Subcutaneous and mild mesenteric edema. Suspected uterine fibroids.   12/18/2019 PET scan   IMPRESSION: 1. No significant abnormal activity to suggest active/recurrent malignancy. 2. Lingual tonsillar activity is mildly increased from prior but probably incidental/physiologic, attention on follow up suggested. 3. Other imaging findings of potential clinical significance: Third spacing of fluid with trace ascites, mesenteric edema, subcutaneous edema, and possibly subtle pulmonary edema. Mild cardiomegaly. Aortic Atherosclerosis (ICD10-I70.0). Coronary atherosclerosis. Right ovarian dermoid. Calcified uterine fibroids. Renal cysts. Right glenohumeral arthropathy. Mild to moderate scoliosis. Suspected pulmonary arterial hypertension.   03/14/2020 PET scan   IMPRESSION: 1.  New small focus of hypermetabolism in segment 4A of the liver worrisome for new metastatic focus. 2. No other findings for metastatic disease involving the neck, chest, abdomen or pelvis.     06/11/2020 PET scan   IMPRESSION: 1. No suspicious hypermetabolic activity within the neck, chest, abdomen or pelvis. 2. The treated dominant peripheral cholangiocarcinoma appears slightly enlarged compared with recent prior studies, and there is a questionable new lesion more superiorly in the left lobe. Although without associated hypermetabolic activity, these lesions are suspicious and would be better assessed with abdominal MRI without and with contrast.      CURRENT THERAPY:  Maintenance single agent Gemcitabine q2weeks starting on 06/28/19 with C16.On chemo break since 12/27/19.She restarted Single Agent Gemcitabine q2weeks on 04/03/20 due to mild disease progression.  INTERVAL HISTORY:  Donna May is here for a follow up of cholangiocarcinomaof liver. She was last seen by me on 06/26/20. She presents to the clinic alone. She denies nausea, new diarrhea, breathing issues, or any other symptoms from chemotherapy. She reports continued, bothersome neuropathy in her hands and feet. She notes she went to a clinic "across the street" for her neuropathy. The paperwork she gave US shows it is the [facility]. She has a follow up on Monday to further discuss her situation. She states she contacted [facility] but never heard back from them. She notes she is eating meat and vegetables, and she feels better as a result.  All other systems were reviewed with the patient and are negative.  MEDICAL HISTORY:  Past Medical History:  Diagnosis Date  . Anemia   . Anxiety   . Arthritis   . Breast cancer ([place]) 1992  . Cataract    Bilateral eyes - surgery to remove  . Chronic diastolic CHF (congestive heart failure) ([place])   . Chronic kidney disease    CKD stage 3   . Complication of anesthesia    "something they use make me itch for a couple of days."  . Depression   . Diabetes mellitus  type 2  . Duodenitis 01/18/2002  . Fainting spell   . GERD (gastroesophageal reflux disease)   . Heart murmur    never has caused any problems  . Hiatal hernia 08/08/2008, 01/18/2002  . History of pneumonia    x 2  . Hyperlipidemia   . Hypertension   . Liver cancer ([place]) 08/2018  . Lymphedema 2017   Right arm    SURGICAL HISTORY: Past Surgical History:  Procedure Laterality Date  . AXILLARY SURGERY     cyst removal, right  . COLONOSCOPY  09/23/2018   Dr. Havery Moros - polyps  . EYE SURGERY Bilateral    cataracts to remove  . LAPAROSCOPY N/A 11/17/2018   Procedure: LAPAROSCOPY DIAGNOSTIC, INTRAOPERATIVE ULTRASOUND, PERITONEAL BIOPSIES;  Surgeon: [name], MD;  Location: [place];  Service: General;  Laterality: N/A;  GENERAL AND EPIDURAL  . LIVER BIOPSY  08/2018   Dr. Lindwood Coke  . MASTECTOMY  1992   right, with flap  . NM MYOCAR PERF WALL MOTION  06/11/2009   Protocol:[place], post stress EF58%, EKG negative for ischemia, low risk  . PORTACATH PLACEMENT N/A 12/02/2018   Procedure: INSERTION PORT-A-CATH;  Surgeon: [name], MD;  Location: [place];  Service: General;  Laterality: N/A;  . RECONSTRUCTION BREAST W/ TRAM FLAP Right   . TONSILLECTOMY    . TRANSTHORACIC ECHOCARDIOGRAM  12/24/2009   LVEF =>55%, normal study  . UPPER GI ENDOSCOPY      I have reviewed the social history and family history with the patient and they are unchanged from previous note.  ALLERGIES:  has No Known Allergies.  MEDICATIONS:  Current Outpatient Medications  Medication Sig Dispense Refill  . amLODipine (NORVASC) 10 MG tablet Take 1 tablet (10 mg total) by mouth daily. 90 tablet 3  . aspirin 81 MG tablet Take 81 mg by mouth daily.     . carvedilol (COREG) 25 MG tablet Take 1 tablet (25 mg total) by mouth 2 (two) times daily with a meal. 180 tablet 3  . furosemide  (LASIX) 40 MG tablet Take 1 tablet (40 mg total) by mouth daily. 30 tablet 11  . gabapentin (NEURONTIN) 100 MG capsule Take 1 capsule (100 mg total) by mouth every 8 (eight) hours as needed. 180 capsule 2  . glucose blood test strip 1 each by Other route 2 (two) times daily. Use Onetouch verio test strips as instructed to check blood sugar twice daily. 100 each 2  . hydrALAZINE (APRESOLINE) 100 MG tablet TAKE THREE TIMES DAILY-AM, MIDDLE OF THE DAY AND IN THE EVENING. 90 tablet 3  . KLOR-CON M10 10 MEQ tablet TAKE 1 TABLET BY MOUTH EVERY DAY 90 tablet 1  . lansoprazole (PREVACID) 15 MG capsule Take 1 capsule (15 mg total) by mouth daily. 90 capsule 1  . loratadine (CLARITIN) 10 MG tablet Take 10 mg by mouth daily as needed for allergies.    . Omega 3 1000 MG CAPS Take 2,000 mg by mouth daily.     . rosuvastatin (CRESTOR) 20 MG tablet Take 1 tablet (20 mg total) by mouth daily. 90 tablet 3  . sitaGLIPtin (JANUVIA) 100 MG tablet Take 1 tablet (100 mg total) by mouth daily. 90 tablet 3  . valsartan (DIOVAN) 320 MG tablet Take 1 tablet (320 mg total) by mouth daily. 90 tablet 3   Current Facility-Administered Medications  Medication Dose Route Frequency Provider Last Rate Last Admin  . triamcinolone acetonide (KENALOG) 10 MG/ML injection 10 mg  10 mg Other Once  Harriet Masson, DPM        PHYSICAL EXAMINATION: ECOG PERFORMANCE STATUS: 2 - Symptomatic, <50% confined to bed  Vitals:   07/11/20 1034  BP: (!) 160/59  Pulse: (!) 59  Resp: 18  Temp: (!) 97.5 F (36.4 C)  SpO2: 100%   Filed Weights   07/11/20 1034  Weight: 126 lb 14.4 oz (57.6 kg)    GENERAL:alert, no distress and comfortable SKIN: skin color, texture, turgor are normal, no rashes or significant lesions EYES: normal, Conjunctiva are pink and non-injected, sclera clear  NECK: supple, thyroid normal size, non-tender, without nodularity LYMPH:  no palpable lymphadenopathy in the cervical, axillary  LUNGS: clear to  auscultation and percussion with normal breathing effort HEART: regular rate & rhythm and no murmurs and no lower extremity edema ABDOMEN:abdomen soft, non-tender and normal bowel sounds Musculoskeletal:no cyanosis of digits and no clubbing  NEURO: alert & oriented x 3 with fluent speech, no focal motor/sensory deficits  LABORATORY DATA:  I have reviewed the data as listed CBC Latest Ref Rng & Units 07/11/2020 06/26/2020 06/12/2020  WBC 4.0 - 10.5 K/uL 5.5 5.9 4.9  Hemoglobin 12.0 - 15.0 g/dL 9.1(L) 8.7(L) 8.6(L)  Hematocrit 36.0 - 46.0 % 27.6(L) 26.6(L) 27.2(L)  Platelets 150 - 400 K/uL 168 155 170     CMP Latest Ref Rng & Units 07/11/2020 06/26/2020 06/12/2020  Glucose 70 - 99 mg/dL 92 104(H) 114(H)  BUN 8 - 23 mg/dL 61(H) 51(H) 35(H)  Creatinine 0.44 - 1.00 mg/dL 2.09(H) 1.70(H) 1.42(H)  Sodium 135 - 145 mmol/L 141 142 142  Potassium 3.5 - 5.1 mmol/L 5.1 4.8 4.3  Chloride 98 - 111 mmol/L 106 108 110  CO2 22 - 32 mmol/L $RemoveB'26 24 23  'fhipWSMc$ Calcium 8.9 - 10.3 mg/dL 9.0 9.1 8.5(L)  Total Protein 6.5 - 8.1 g/dL 7.1 7.0 6.9  Total Bilirubin 0.3 - 1.2 mg/dL <0.2(L) 0.3 0.3  Alkaline Phos 38 - 126 U/L 55 54 63  AST 15 - 41 U/L $Remo'22 18 18  'EGRVn$ ALT 0 - 44 U/L $Remo'11 8 12      'lXGxA$ RADIOGRAPHIC STUDIES: I have personally reviewed the radiological images as listed and agreed with the findings in the report. No results found.   ASSESSMENT & PLAN:  Donna May is a 80 y.o. female with    1.Intrahepatic cholangiocarcinoma, cT1N0M1,with peritoneal metastasis, MSS, IDH1 mutation (+) -She was diagnosed in 08/2018. Her biopsy of her liver mass shows adenocarcinoma,most consistent withcholangiocarcinoma.Her EGD/Colonoscopy from 7/10/20was negativeand 09/29/18 PET scanshowedno evidence of distant metastasis. -She was brought to OR on 11/17/18,unfortunately she was found to have peritoneal metastasis to right diaphragm and surgery was aborted. -Given her metastatic cancer, Istarted her onsystemicchemo  withFirst line chemoOxaliplatinand gemcitabine q2weeksto control her diseasebeginningon 11/30/18.Due to poor tolerance to Oxaliplatin and Cisplatin from neuropathy, she proceeded withsingleagent Gemcitabineevery 2 weeksstarting 06/28/19 with C16.Shewas onchemo break since10/2021. -GivenPET from 03/14/20 showedconcern for new liver lesion, I restarted her onmaintenance Gemcitabine q2 weeksbeginning1/19/22. -HerPETimagesfrom 06/11/20 showed her priorhypermetabolicliver lesion is no longer hypermetabolic on PET, but present and slightlylarger in size. No new lesions. I showed her these images today and compared them with her prior images. -She is overall stable on maintenance Gemcitabine, with no new concerns. Labs reviewed and stable. Overall adequate to proceed with Gemcitabine today at same dose.  -She would like to keep seeing me every 2 weeks.  2. Anemia,Secondary to chemoand cancer -Shepreviouslyrequiredblood transfusionsince 03/2019, continue as needed. Will continue prenatal vitamin. -Lastblood transfusion10/25/21. -Hg has been stable  in 9 rangeoff chemo. With restart of chemo, Hg has dropped to [place].If she is symptomatic or labs drop below 8, will give blood transfusion. -Stable and moderate recently.   3.Peripheral neuropathy, G3, Secondary to Oxaliplatinand Cisplatin (both d/c) -Due to low tolerance and not much help, she stopped Lyrica,Gabapentinand Cymbalta. -She waspreviouslyseen by Dr Mickeal Skinner.She is now being seen byChiropractor Dr Page Spiro. -She remains to have decreased function overall.Stablewith occasional flares.  -ContinueGabapentin 100mg  to use at night as needed.  -She reached out to [facility] and made an appointment. -I will also refer her to [facility] per pt's request, which is covered by her insurance.  4. H/o right breast cancer, Geneticsnegative, Osteopenia -s/prightmastectomy  in 1992, per patient did not require adjuvant therapy. She was last seen by Dr. Jana Hakim in 2017. -Her4/2019 DEXA shows mild osteopenia. F/u with Gyn about continuing Raloxifene. I do not think she has to continue at this point.  5.Comorbidities:DM,HTN,hiatal hernia, GERD, renal artery stenosis,2021pulmonary edema -Continue medications and f/u with PCP and cardiologist for management. -She is on torsemide twice a week, and hydrochlorothiazide daily  6. Family support, Goal of Care Discussion -She has a son and daughterwho are often busy but help as needed. -The patient understands the goal of care is palliative.She is full code now  7.CKD stage III -Continue to monitor.Stable Cr at 1.2-15.   PLAN: -Labs reviewed and adequate to proceed with Gemcitabine todayat same dose. -Lab, Flush, F/u and Gemcitabine in2 weeks  -She prefers to be treated on Wednesdays -neurology referral    No problem-specific Assessment & Plan notes found for this encounter.   Orders Placed This Encounter  Procedures  . Ambulatory referral to Neurology    Referral Priority:   Routine    Referral Type:   Consultation    Referral Reason:   Specialty Services Required    Requested Specialty:   Neurology    Number of Visits Requested:   1   All questions were answered. The patient knows to call the clinic with any problems, questions or concerns. No barriers to learning was detected. The total time spent in the appointment was 30 minutes.     [name], MD 07/11/2020   I, Wilburn Mylar, am acting as scribe for [name], MD.   I have reviewed the above documentation for accuracy and completeness, and I agree with the above.

## 2020-07-11 NOTE — Progress Notes (Signed)
Ok to treat today per Dr. Burr Medico with Scr of 2.09

## 2020-07-12 ENCOUNTER — Ambulatory Visit: Payer: Medicare Other

## 2020-07-12 ENCOUNTER — Telehealth: Payer: Self-pay | Admitting: Hematology

## 2020-07-12 NOTE — Telephone Encounter (Signed)
Scheduled follow-up appointments per 4/28 los. Patient is aware. ?

## 2020-07-15 ENCOUNTER — Telehealth: Payer: Self-pay | Admitting: Hematology

## 2020-07-15 NOTE — Telephone Encounter (Signed)
Called pt to r/s appts per 5/2 sch msg. No answer. Left msg for pt to call back to r/s.

## 2020-07-17 ENCOUNTER — Ambulatory Visit: Payer: Medicare Other | Admitting: Podiatry

## 2020-07-17 ENCOUNTER — Telehealth: Payer: Self-pay | Admitting: Family Medicine

## 2020-07-17 ENCOUNTER — Ambulatory Visit (INDEPENDENT_AMBULATORY_CARE_PROVIDER_SITE_OTHER): Payer: Medicare Other | Admitting: Diagnostic Neuroimaging

## 2020-07-17 ENCOUNTER — Encounter: Payer: Self-pay | Admitting: Diagnostic Neuroimaging

## 2020-07-17 VITALS — BP 156/68 | HR 59 | Ht 65.0 in | Wt 126.0 lb

## 2020-07-17 DIAGNOSIS — I248 Other forms of acute ischemic heart disease: Secondary | ICD-10-CM

## 2020-07-17 DIAGNOSIS — T451X5A Adverse effect of antineoplastic and immunosuppressive drugs, initial encounter: Secondary | ICD-10-CM

## 2020-07-17 DIAGNOSIS — G620 Drug-induced polyneuropathy: Secondary | ICD-10-CM

## 2020-07-17 NOTE — Telephone Encounter (Signed)
Called and spoke with Lennette Bihari, as Shanon Brow was not available, he informed me they did receive the paperwork they were requesting.

## 2020-07-17 NOTE — Telephone Encounter (Signed)
Donna May is calling and is requesting the recent office notes and a recent medication list to be faxed to [phone] for diabetic supplies. CB [phone]

## 2020-07-17 NOTE — Progress Notes (Signed)
[facility]  PATIENT: Donna May DOB: [birth year]  REFERRING CLINICIAN: [name], MD HISTORY FROM: patient  REASON FOR VISIT: New consult   HISTORICAL  CHIEF COMPLAINT:  Chief Complaint  Patient presents with  . Peripheral Neuropathy    Rm 7 New Pt  "getting chemotherapy for liver cancer; neuropathy in hands/feet, gabapentin causes me to sleep too much"    HISTORY OF PRESENT ILLNESS:   80 year old female here for evaluation of chemotherapy neuropathy with intrahepatic cholangiocarcinoma.  Patient underwent chemotherapy treatments in 2020 with ox platinum, and changed to cisplatin, but had to stop due to neuropathy symptoms.  She describes pins-and-needles sensation and severe pain.  She is tried gabapentin, Lyrica and Cymbalta briefly which caused side effects and she stopped after a few days each.   REVIEW OF SYSTEMS: Full 14 system review of systems performed and negative with exception of: As per HPI.  ALLERGIES: No Known Allergies  HOME MEDICATIONS: Outpatient Medications Prior to Visit  Medication Sig Dispense Refill  . amLODipine (NORVASC) 10 MG tablet Take 1 tablet (10 mg total) by mouth daily. 90 tablet 3  . aspirin 81 MG tablet Take 81 mg by mouth daily.     . carvedilol (COREG) 25 MG tablet Take 1 tablet (25 mg total) by mouth 2 (two) times daily with a meal. 180 tablet 3  . furosemide (LASIX) 40 MG tablet Take 1 tablet (40 mg total) by mouth daily. 30 tablet 11  . glucose blood test strip 1 each by Other route 2 (two) times daily. Use Onetouch verio test strips as instructed to check blood sugar twice daily. 100 each 2  . hydrALAZINE (APRESOLINE) 100 MG tablet TAKE THREE TIMES DAILY-AM, MIDDLE OF THE DAY AND IN THE EVENING. 90 tablet 3  . KLOR-CON M10 10 MEQ tablet TAKE 1 TABLET BY MOUTH EVERY DAY 90 tablet 1  . lansoprazole (PREVACID) 15 MG capsule Take 1 capsule (15 mg total) by mouth daily. 90 capsule 1  . loratadine (CLARITIN) 10 MG  tablet Take 10 mg by mouth daily as needed for allergies.    . Omega 3 1000 MG CAPS Take 2,000 mg by mouth daily.     . sitaGLIPtin (JANUVIA) 100 MG tablet Take 1 tablet (100 mg total) by mouth daily. 90 tablet 3  . valsartan (DIOVAN) 320 MG tablet Take 1 tablet (320 mg total) by mouth daily. 90 tablet 3  . gabapentin (NEURONTIN) 100 MG capsule Take 1 capsule (100 mg total) by mouth every 8 (eight) hours as needed. (Patient not taking: Reported on 07/17/2020) 180 capsule 2  . rosuvastatin (CRESTOR) 20 MG tablet Take 1 tablet (20 mg total) by mouth daily. 90 tablet 3   Facility-Administered Medications Prior to Visit  Medication Dose Route Frequency Provider Last Rate Last Admin  . triamcinolone acetonide (KENALOG) 10 MG/ML injection 10 mg  10 mg Other Once Harriet Masson, DPM        PAST MEDICAL HISTORY: Past Medical History:  Diagnosis Date  . Anemia   . Anxiety   . Arthritis   . Breast cancer ([place]) 1992   right  . Cataract    Bilateral eyes - surgery to remove  . Chronic diastolic CHF (congestive heart failure) ([place])   . Chronic kidney disease    CKD stage 3  . Complication of anesthesia    "something they use make me itch for a couple of days."  . Depression   . Diabetes mellitus  type 2  . Duodenitis 01/18/2002  . Fainting spell   . GERD (gastroesophageal reflux disease)   . Heart murmur    never has caused any problems  . Hiatal hernia 08/08/2008, 01/18/2002  . History of pneumonia    x 2  . Hyperlipidemia   . Hypertension   . Liver cancer ([place]) 08/2018   chemotherapy  . Lymphedema 2017   Right arm  . Peripheral neuropathy     PAST SURGICAL HISTORY: Past Surgical History:  Procedure Laterality Date  . AXILLARY SURGERY     cyst removal, right  . COLONOSCOPY  09/23/2018   Dr. Havery Moros - polyps  . EYE SURGERY Bilateral    cataracts to remove  . LAPAROSCOPY N/A 11/17/2018   Procedure: LAPAROSCOPY DIAGNOSTIC, INTRAOPERATIVE ULTRASOUND, PERITONEAL BIOPSIES;   Surgeon: [name], MD;  Location: [facility];  Service: General;  Laterality: N/A;  GENERAL AND EPIDURAL  . LIVER BIOPSY  08/2018   Dr. Lindwood Coke  . MASTECTOMY  1992   right, with flap  . NM MYOCAR PERF WALL MOTION  06/11/2009   Protocol:[place], post stress EF58%, EKG negative for ischemia, low risk  . PORTACATH PLACEMENT N/A 12/02/2018   Procedure: INSERTION PORT-A-CATH;  Surgeon: [name], MD;  Location: [facility];  Service: General;  Laterality: N/A;  . RECONSTRUCTION BREAST W/ TRAM FLAP Right   . TONSILLECTOMY  1958  . TRANSTHORACIC ECHOCARDIOGRAM  12/24/2009   LVEF =>55%, normal study  . UPPER GI ENDOSCOPY      FAMILY HISTORY: Family History  Problem Relation Age of Onset  . Breast cancer Cousin        diagnosed >50; mother's first cousins  . Diabetes Brother   . Heart disease Brother   . Heart disease Mother   . Hypertension Mother   . Multiple sclerosis Mother   . Heart disease Father   . Hypertension Father   . Stroke Father   . Prostate cancer Brother 57  . Heart attack Maternal Grandmother   . Stroke Maternal Grandfather   . Colon cancer Neg Hx   . Esophageal cancer Neg Hx   . Stomach cancer Neg Hx   . Rectal cancer Neg Hx     SOCIAL HISTORY: Social History   Socioeconomic History  . Marital status: Single    Spouse name: Not on file  . Number of children: 2  . Years of education: 59  . Highest education level: Not on file  Occupational History  . Occupation: retired    Comment: disabled  Tobacco Use  . Smoking status: Former Smoker    Packs/day: 0.25    Years: 25.00    Pack years: 6.25    Types: Cigarettes    Quit date: 1990    Years since quitting: 32.3  . Smokeless tobacco: Never Used  Vaping Use  . Vaping Use: Never used  Substance and Sexual Activity  . Alcohol use: Not Currently    Alcohol/week: 0.0 standard drinks  . Drug use: Never  . Sexual activity: Yes    Partners: Male    Birth control/protection: Condom, Post-menopausal     Comment: First sexual encounter age 39. Fewer than 5 partners in life time.  Other Topics Concern  . 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   Has had 3 Covid vaccines   Social Determinants of Health   Financial Resource Strain: Low Risk   . Difficulty of Paying Living Expenses: Not hard at all  Food Insecurity: No Food Insecurity  . Worried About Charity fundraiser in the Last Year: Never true  . Ran Out of Food in the Last Year: Never true  Transportation Needs: No Transportation Needs  . Lack of Transportation (Medical): No  . Lack of Transportation (Non-Medical): No  Physical Activity: Inactive  . Days of Exercise per Week: 0 days  . Minutes of Exercise per Session: 0 min  Stress: No Stress Concern Present  . Feeling of Stress : Not at all  Social Connections: Moderately Isolated  . Frequency of Communication with Friends and Family: More than three times a week  . Frequency of Social Gatherings with Friends and Family: More than three times a week  . Attends Religious Services: More than 4 times per year  . Active Member of Clubs or Organizations: No  . Attends Archivist Meetings: Never  . Marital Status: Widowed  Intimate Partner Violence: Not At Risk  . Fear of Current or Ex-Partner: No  . Emotionally Abused: No  . Physically Abused: No  . Sexually Abused: No     PHYSICAL EXAM  GENERAL EXAM/CONSTITUTIONAL: Vitals:  Vitals:   07/17/20 1054  BP: (!) 156/68  Pulse: (!) 59  Weight: 126 lb (57.2 kg)  Height: 5\' 5"  (1.651 m)     Body mass index is 20.97 kg/m. Wt Readings from Last 3 Encounters:  07/17/20 126 lb (57.2 kg)  07/11/20 126 lb 14.4 oz (57.6 kg)  06/26/20 127 lb 3.2 oz (57.7 kg)     Patient is in no distress; well developed,  nourished and groomed; neck is supple  CARDIOVASCULAR:  Examination of carotid arteries is normal; no carotid bruits  Regular rate and rhythm, no murmurs  Examination of peripheral vascular system by observation and palpation is normal  EYES:  Ophthalmoscopic exam of optic discs and posterior segments is normal; no papilledema or hemorrhages  No exam data present  MUSCULOSKELETAL:  Gait, strength, tone, movements noted in Neurologic exam below  NEUROLOGIC: MENTAL STATUS:  MMSE - Mini Mental State Exam 05/04/2017  Not completed: (No Data)    awake, alert, oriented to person, place and time  recent and remote memory intact  normal attention and concentration  language fluent, comprehension intact, naming intact  fund of knowledge appropriate  CRANIAL NERVE:   2nd - no papilledema on fundoscopic exam  2nd, 3rd, 4th, 6th - pupils equal and reactive to light, visual fields full to confrontation, extraocular muscles intact, no nystagmus  5th - facial sensation symmetric  7th - facial strength symmetric  8th - hearing intact  9th - palate elevates symmetrically, uvula midline  11th - shoulder shrug symmetric  12th - tongue protrusion midline  MOTOR:   normal bulk and tone, full strength in the BUE, BLE; except atrophy and weakness of intrinsic hand muscles right worse than left  SENSORY:   normal and symmetric to light touch, temperature, vibration; DECR AT ANKLES AND TOES  COORDINATION:   finger-nose-finger, fine finger movements normal  REFLEXES:   deep tendon reflexes TRACE and symmetric  GAIT/STATION:   narrow based gait; USING CANE     DIAGNOSTIC DATA (LABS, IMAGING, TESTING) - I reviewed patient records, labs, notes, testing and imaging myself where available.  Lab Results  Component  Value Date   WBC 5.5 07/11/2020   HGB 9.1 (L) 07/11/2020   HCT 27.6 (L) 07/11/2020   MCV 101.1 (H) 07/11/2020   PLT 168 07/11/2020      Component  Value Date/Time   NA 141 07/11/2020 1011   NA 141 01/29/2020 0840   NA 144 11/19/2015 0921   K 5.1 07/11/2020 1011   K 4.8 11/19/2015 0921   CL 106 07/11/2020 1011   CO2 26 07/11/2020 1011   CO2 27 11/19/2015 0921   GLUCOSE 92 07/11/2020 1011   GLUCOSE 109 11/19/2015 0921   BUN 61 (H) 07/11/2020 1011   BUN 37 (H) 01/29/2020 0840   BUN 14.4 11/19/2015 0921   CREATININE 2.09 (H) 07/11/2020 1011   CREATININE 1.30 (H) 10/23/2019 1150   CREATININE 1.2 (H) 11/19/2015 0921   CALCIUM 9.0 07/11/2020 1011   CALCIUM 9.8 11/19/2015 0921   PROT 7.1 07/11/2020 1011   PROT 6.6 10/07/2017 1251   PROT 7.2 11/19/2015 0921   ALBUMIN 3.7 07/11/2020 1011   ALBUMIN 4.2 10/07/2017 1251   ALBUMIN 3.6 11/19/2015 0921   AST 22 07/11/2020 1011   AST 20 11/19/2015 0921   ALT 11 07/11/2020 1011   ALT 14 11/19/2015 0921   ALKPHOS 55 07/11/2020 1011   ALKPHOS 59 11/19/2015 0921   BILITOT <0.2 (L) 07/11/2020 1011   BILITOT 0.41 11/19/2015 0921   GFRNONAA 23 (L) 07/11/2020 1011   GFRNONAA 39 (L) 10/23/2019 1150   GFRAA 30 (L) 01/29/2020 0840   GFRAA 31 (L) 12/13/2019 0812   GFRAA 45 (L) 10/23/2019 1150   Lab Results  Component Value Date   CHOL 239 (H) 02/14/2020   HDL 85 02/14/2020   LDLCALC 145 (H) 02/14/2020   TRIG 55 02/14/2020   CHOLHDL 2.8 02/14/2020   Lab Results  Component Value Date   HGBA1C 4.9 12/28/2019   Lab Results  Component Value Date   VITAMINB12 1,125 (H) 01/17/2013   Lab Results  Component Value Date   TSH 1.48 04/26/2017       ASSESSMENT AND PLAN  80 y.o. year old female here with:  Dx:  1. Chemotherapy-induced peripheral neuropathy (HCC)      PLAN:  Painful neuropathy treatment options: - consider duloxetine 30-60mg  daily or gabapentin 100mg  once a day; caution with reduced kidney function - consider capsaicin cream, lidocaine patch / cream, alpha-lipoic acid 600mg  daily - patient is reluctant to try additional medications; she will think about  options and follow up with PCP / oncology  Return for return to PCP.    Penni Bombard, MD 07/20/3147, 70:26 AM Certified in Neurology, Neurophysiology and Neuroimaging  [facility] [address], [place], [place] [address] [phone]

## 2020-07-17 NOTE — Patient Instructions (Addendum)
  Painful neuropathy treatment options: - consider duloxetine 30-60mg  daily or gabapentin 100mg  once a day; caution with reduced kidney function  - consider capsaicin cream, lidocaine patch / cream, alpha-lipoic acid 600mg  daily  - patient will think about options and follow up with PCP / oncology

## 2020-07-18 ENCOUNTER — Other Ambulatory Visit: Payer: Medicare Other

## 2020-07-18 ENCOUNTER — Encounter: Payer: Self-pay | Admitting: Diagnostic Neuroimaging

## 2020-07-18 ENCOUNTER — Other Ambulatory Visit: Payer: Self-pay

## 2020-07-18 VITALS — BP 137/72 | HR 63 | Temp 98.0°F | Resp 18 | Wt 126.0 lb

## 2020-07-18 DIAGNOSIS — Z515 Encounter for palliative care: Secondary | ICD-10-CM

## 2020-07-18 NOTE — Progress Notes (Signed)
PATIENT NAME: Donna May DOB: 1940/07/08 MRN: [id number]  PRIMARY CARE PROVIDER: [name], MD  RESPONSIBLE PARTY:  Acct ID - Guarantor Home Phone Work Phone Relationship Acct Type  [id number] CARIS, CERVENY* [phone]  Self P/F     [place], [place], [place] [id number]    PLAN OF CARE and INTERVENTIONS:               1.  GOALS OF CARE/ ADVANCE CARE PLANNING:  Patient desires to remain in her home and independent for as long as possible.               2.  PATIENT/CAREGIVER EDUCATION:  Healthy cooking options and Skin Care.               4. PERSONAL EMERGENCY PLAN:  Activate 911 for emergencies.               5.  DISEASE STATUS:  Patient provides an update on her medical condition.  Patient continues to drive to appointments and completes her own household chores.  Patient states she still has feeling in her hands and feet and feels safe at this time for driving.   Patient reports some issues with dizziness.  This typically occurs with sudden movement. No falls are reported.  Education provided on safety.  Patient continues with chemo treatments for intrahepatic cholangiocarcinoma.  Significant issues with chemo-induced neuropathy to hands and feet.  Patient states she has seen neurology regarding the neuropathy.  She is planning on adding B12 complex as recommended.  She does not wish to use gabapentin due to the side effects of drowsiness.   Patient states she is eating better and this is attributed to her weight gain.  She continues with salads and is also eating fresh fish that is fried.  We discussed baking fish vs frying.  Patient continues to eat out but not as often as she did a few months ago.  Patient enjoys cooking at home.  Patient discusses the upcoming graduation of her granddaughter.  She notes a weight loss of almost 20 lbs in the last 2 years and need for new clothing.    HISTORY OF PRESENT ILLNESS:  80 year old female with hx of HTN and CHF.  Patient is  being followed by Palliative Care monthly and PRN.  CODE STATUS: DNR ADVANCED DIRECTIVES: Yes MOST FORM: Yes PPS: 60%   PHYSICAL EXAM:   VITALS: Today's Vitals   07/18/20 1112  BP: 137/72  Pulse: 63  Resp: 18  Temp: 98 F (36.7 C)  SpO2: 98%  Weight: 126 lb (57.2 kg)  PainSc: 0-No pain    LUNGS:  CTA, no rhonchi, wheezes or rales present. CARDIAC: HRR EXTREMITIES: right mid-arm with lymph edema present.  Patient has a compression sleeve but is not using this. SKIN: warm and dry to touch. No skin breakdown present.  Right medial heel with small callused area present.  Discussed heel protectors/pillows when in the bed. NEURO: alert and oriented x 4.        Lorenza Burton, RN

## 2020-07-19 ENCOUNTER — Encounter: Payer: Self-pay | Admitting: Podiatry

## 2020-07-19 ENCOUNTER — Ambulatory Visit (INDEPENDENT_AMBULATORY_CARE_PROVIDER_SITE_OTHER): Payer: Medicare Other | Admitting: Podiatry

## 2020-07-19 ENCOUNTER — Other Ambulatory Visit: Payer: Self-pay

## 2020-07-19 DIAGNOSIS — N1832 Chronic kidney disease, stage 3b: Secondary | ICD-10-CM

## 2020-07-19 DIAGNOSIS — E1140 Type 2 diabetes mellitus with diabetic neuropathy, unspecified: Secondary | ICD-10-CM | POA: Insufficient documentation

## 2020-07-19 DIAGNOSIS — M79674 Pain in right toe(s): Secondary | ICD-10-CM

## 2020-07-19 DIAGNOSIS — B351 Tinea unguium: Secondary | ICD-10-CM | POA: Diagnosis not present

## 2020-07-19 DIAGNOSIS — E1122 Type 2 diabetes mellitus with diabetic chronic kidney disease: Secondary | ICD-10-CM | POA: Diagnosis not present

## 2020-07-19 DIAGNOSIS — E1142 Type 2 diabetes mellitus with diabetic polyneuropathy: Secondary | ICD-10-CM

## 2020-07-19 DIAGNOSIS — M79675 Pain in left toe(s): Secondary | ICD-10-CM | POA: Diagnosis not present

## 2020-07-19 NOTE — Progress Notes (Signed)
This patient returns to my office for at risk foot care.  This patient requires this care by a professional since this patient will be at risk due to having diabetes and peripheral neuropathy due to chemo. This patient is unable to cut nails himself since the patient cannot reach his nails.These nails are painful walking and wearing shoes.  This patient presents for at risk foot care today.  General Appearance  Alert, conversant and in no acute stress.  Vascular  Dorsalis pedis  pulses are weakly  palpable  bilaterally. Posterior tibial pulses are absent  B/L. Capillary return is within normal limits  Bilaterally.Cold feet  Bilaterally.Absent digital hair B/L.  Neurologic  Senn-Weinstein monofilament wire test diminished   bilaterally. Muscle power within normal limits bilaterally.  Nails Thick disfigured discolored nails with subungual debris  from second  to fifth toes bilaterally. No evidence of bacterial infection or drainage bilaterally.  Orthopedic  No limitations of motion  feet .  No crepitus or effusions noted.  No bony pathology or digital deformities noted.  HAV  B/L  Skin  normotropic skin with no porokeratosis noted bilaterally.  No signs of infections or ulcers noted.     Onychomycosis  Pain in right toes  Pain in left toes  Consent was obtained for treatment procedures.   Mechanical debridement of nails 1-5  bilaterally performed with a nail nipper.  Filed with dremel without incident. Patient qualifies for diabetic shoes due to DPN and HAV  B/L.   Return office visit   3 months                   Told patient to return for periodic foot care and evaluation due to potential at risk complications.   Gardiner Barefoot DPM

## 2020-07-19 NOTE — Progress Notes (Signed)
[place]   [phone] [phone]) [phone]   Clinic Follow up Note   Patient Care Team: [name], MD as PCP - General (Family Medicine) [name], [name], MD as PCP - Cardiology (Cardiology) [name], [name], MD as Consulting Physician (Cardiology) [name], MD as Consulting Physician (Obstetrics and Gynecology) [name], [name], NP as Nurse Practitioner (Hematology and Oncology) [facility] [name], MD as Consulting Physician (Hematology) [name], [name], MD as Consulting Physician (Gastroenterology) Virgina Evener, Dawn, RN (Inactive) as Oncology Nurse Navigator [name], MD as Consulting Physician (General Surgery)  Date of Service:  07/24/2020  CHIEF COMPLAINT: f/u of cholangiocarcinomaof liver  SUMMARY OF ONCOLOGIC HISTORY: Oncology History Overview Note  Cancer Staging Intrahepatic cholangiocarcinoma ([place]) Staging form: Intrahepatic Bile Duct, AJCC 8th Edition - Clinical stage from 09/23/2018: Stage IB (cT1b, cN0, cM0) - Signed by [name], MD on 10/06/2018    Intrahepatic cholangiocarcinoma ([place])  09/07/2018 Imaging   CT Chest IMPRESSION: 1. New, enhancing mass involving segment 4 of the liver and fundus of gallbladder is concerning for malignancy. This may represent either metastatic disease from breast cancer or neoplasm primary to the liver or hepatic biliary tree. Further evaluation with contrast enhanced CT of the abdomen and pelvis is recommended. 2. No findings to suggest metastatic disease within the chest. 3.  Aortic Atherosclerosis (ICD10-I70.0). 4. Coronary artery calcifications.   09/13/2018 Pathology Results   Diagnosis Liver, needle/core biopsy - ADENOCARCINOMA. Microscopic Comment Immunohistochemistry for CK7 is positive. CK20, TTF1, CDX-2, GATA-3, PAX 8, Qualitative ER, p63 and CK5/6 are negative. The provided clinical history of remote mammary carcinoma is noted. Based on the  morphology and immunophenotype of the adenocarcinoma observed in this specimen, primary cholangiocarcinoma is favored. Clinical and radiologic correlation are  encouraged. Results reported to [employer] on 09/15/2018. Intradepartmental consultation (Dr. Vic Ripper).   09/13/2018 Initial Diagnosis   Cholangiocarcinoma ([place])   09/23/2018 Procedure   Colonoscopy by Dr. Havery Moros 09/23/18  IMPRESSION - Two 3 to 4 mm polyps in the ascending colon, removed with a cold snare. Resected and retrieved. - Five 3 to 5 mm polyps in the transverse colon, removed with a cold snare. Resected and retrieved. - One 5 mm polyp at the splenic flexure, removed with a cold snare. Resected and retrieved. - Three 3 to 5 mm polyps in the sigmoid colon, removed with a cold snare. Resected and retrieved. - The examination was otherwise normal. Upper Endopscy by Dr. Havery Moros 09/23/18  IMPRESSION - Esophagogastric landmarks identified. - 2 cm hiatal hernia. - Normal esophagus otherwise. - A single gastric polyp. Resected and retrieved. - Mild gastritis. Biopsied. - Normal duodenal bulb and second portion of the duodenum.   09/23/2018 Pathology Results   Diagnosis 09/23/18 1. Surgical [P], duodenum - BENIGN SMALL BOWEL MUCOSA. - NO ACTIVE INFLAMMATION OR VILLOUS ATROPHY IDENTIFIED. 2. Surgical [P], stomach, polyp - HYPERPLASTIC POLYP(S). - THERE IS NO EVIDENCE OF MALIGNANCY. 3. Surgical [P], gastric antrum and gastric body - CHRONIC INACTIVE GASTRITIS. - THERE IS NO EVIDENCE OF HELICOBACTER-PYLORI, DYSPLASIA, OR MALIGNANCY. - SEE COMMENT. 4. Surgical [P], colon, sigmoid, splenic flexure, transverse and ascending, polyp (9) - TUBULAR ADENOMA(S). - SESSILE SERRATED POLYP WITHOUT CYTOLOGIC DYSPLASIA. - HIGH GRADE DYSPLASIA IS NOT IDENTIFIED. 5. Surgical [P], colon, sigmoid, polyp (2) - HYPERPLASTIC POLYP(S). - THERE IS NO EVIDENCE OF MALIGNANCY.   09/23/2018 Cancer Staging   Staging form: Intrahepatic Bile  Duct, AJCC 8th Edition - Clinical stage from 09/23/2018: Stage IB (cT1b, cN0, cM0) -  Signed by [name], MD on 10/06/2018   09/29/2018 PET scan   PET 09/29/18 IMPRESSION: 1. Hypermetabolic mass in the RIGHT hepatic lobe consistent with biopsy proven adenocarcinoma. No additional liver metastasis. 2. No evidence of local breast cancer recurrence in the RIGHT breast or RIGHT axilla. 3. Mild bilateral hypermetabolic adrenal glands is favored benign hyperplasia. 4. No evidence of additional metastatic disease on skull base to thigh FDG PET scan.   11/06/2018 Genetic Testing   Negative genetic testing on the [employer] Common Hereditary Cancers panel. A variant of uncertain significance was identified in one of her APC genes, called c.1243G>A (p.Ala415Thr).  The Common Hereditary Cancers Panel offered by [facility] includes sequencing and/or deletion duplication testing of the following 48 genes: APC, ATM, AXIN2, BARD1, BMPR1A, BRCA1, BRCA2, BRIP1, CDH1, CDK4, CDKN2A (p14ARF), CDKN2A (p16INK4a), CHEK2, CTNNA1, DICER1, EPCAM (Deletion/duplication testing only), GREM1 (promoter region deletion/duplication testing only), KIT, MEN1, MLH1, MSH2, MSH3, MSH6, MUTYH, NBN, NF1, NHTL1, PALB2, PDGFRA, PMS2, POLD1, POLE, PTEN, RAD50, RAD51C, RAD51D, RNF43, SDHB, SDHC, SDHD, SMAD4, SMARCA4. STK11, TP53, TSC1, TSC2, and VHL.  The following genes were evaluated for sequence changes only: SDHA and HOXB13 c.251G>A variant only.    11/17/2018 Pathology Results   Diagnosis 11/17/18 1. Soft tissue, biopsy, Diaphragmatic nodules - METASTATIC ADENOCARCINOMA, CONSISTENT WITH PATIENT'S CLINICAL HISTORY OF CHOLANGIOCARCINOMA. SEE NOTE 2. Liver, biopsy, Left - LIVER PARENCHYMA WITH A BENIGN FIBROTIC NODULE - NO EVIDENCE OF MALIGNANCY 3. Stomach, biopsy - BENIGN PAPILLARY MESOTHELIAL HYPERPLASIA - NO EVIDENCE OF MALIGNANCY   11/29/2018 Imaging   CT CAP WO Contrast  IMPRESSION: 1. Dominant liver mass appears grossly stable from  09/29/2018. Additional liver lesions are too small to characterize but were not shown to be hypermetabolic on PET. 2. Mild nodularity of both adrenal glands with associated hypermetabolism on 09/29/2018. Continued attention on follow-up exams is warranted. 3. Small right lower lobe nodules, stable from 09/07/2018. Again, attention on follow-up is recommended. 4. Trace bilateral pleural fluid. 5. Aortic atherosclerosis (ICD10-170.0). Coronary artery calcification. 6. Enlarged pulmonic trunk, indicative of pulmonary arterial hypertension.     11/30/2018 - 06/14/2019 Chemotherapy   First line chemo Oxaliplatin and gemcitabine q2weeks starting 11/30/18. Stopped Oxaliplatin on 05/03/19 due to worsening neuropathy. Added Cisplatin with C13 on 05/17/19 and stopped on C15 06/14/19 due to neuropathy. Reduced to maintenance single agent Gemcitabine on 06/28/19.    01/20/2019 Imaging   CT CAP IMPRESSION: restaging  1. Mild interval increase in size of dominant liver mass involving segment 4 and segment 5. 2. No significant or progressive adrenal nodularity identified to suggest metastatic disease. 3. Unchanged appearance of small right lower lobe and lingular lung nodules, nonspecific.   03/21/2019 PET scan   IMPRESSION: 1. Large right hepatic mass with SUV uptake near background hepatic activity, dramatic response to therapy, also with decrease in size when compared to the prior study. 2. Signs of prior right mastectomy and axillary dissection as before. 3. Left adrenal activity in no longer above the level of activity seen in the contralateral, right adrenal gland or in the liver. Attention on follow-up. 4. No new signs of disease.   06/27/2019 PET scan   IMPRESSION: 1. Further decrease in size of a dominant hepatic mass which is non FDG avid. 2. No evidence of metastatic disease. 3. No evidence of left adrenal hypermetabolism or mass. 4. Incidental findings, including: Uterine fibroids.  Coronary artery atherosclerosis. Aortic Atherosclerosis (ICD10-I70.0). Pulmonary artery enlargement suggests pulmonary arterial hypertension.   06/28/2019 -  Chemotherapy   Maintenance  single agent Gemcitabine q2weeks starting on 06/28/19 with C16.  ------On chemo break since 12/27/19.  ------She restarted Single Agent Gemcitabine q2weeks on 04/03/20 due to mild disease progression in liver on 02/2020 PET    09/15/2019 PET scan   IMPRESSION: 1. In the region of regional tumor at the junction of the right and left hepatic lobe, there is continued hypodensity and overall activity slightly less than that of the surrounding normal liver, indicating effective response to therapy. No current worrisome hypermetabolic lesion is identified in the liver or elsewhere. 2. Chronic asymmetric edema in the subcutaneous tissues of the right upper extremity, nonspecific. The patient has had a prior right mastectomy and right axillary dissection. 3. Other imaging findings of potential clinical significance: Aortic Atherosclerosis (ICD10-I70.0). Coronary atherosclerosis. Mild cardiomegaly. Small right and trace left pleural effusions. Subcutaneous and mild mesenteric edema. Suspected uterine fibroids.   12/18/2019 PET scan   IMPRESSION: 1. No significant abnormal activity to suggest active/recurrent malignancy. 2. Lingual tonsillar activity is mildly increased from prior but probably incidental/physiologic, attention on follow up suggested. 3. Other imaging findings of potential clinical significance: Third spacing of fluid with trace ascites, mesenteric edema, subcutaneous edema, and possibly subtle pulmonary edema. Mild cardiomegaly. Aortic Atherosclerosis (ICD10-I70.0). Coronary atherosclerosis. Right ovarian dermoid. Calcified uterine fibroids. Renal cysts. Right glenohumeral arthropathy. Mild to moderate scoliosis. Suspected pulmonary arterial hypertension.   03/14/2020 PET scan   IMPRESSION: 1.  New small focus of hypermetabolism in segment 4A of the liver worrisome for new metastatic focus. 2. No other findings for metastatic disease involving the neck, chest, abdomen or pelvis.     06/11/2020 PET scan   IMPRESSION: 1. No suspicious hypermetabolic activity within the neck, chest, abdomen or pelvis. 2. The treated dominant peripheral cholangiocarcinoma appears slightly enlarged compared with recent prior studies, and there is a questionable new lesion more superiorly in the left lobe. Although without associated hypermetabolic activity, these lesions are suspicious and would be better assessed with abdominal MRI without and with contrast.      CURRENT THERAPY:  Maintenance single agent Gemcitabine q2weeks starting on 06/28/19 with C16.On chemo break since 12/27/19.She restarted Single Agent Gemcitabine q2weeks on 04/03/20 due to mild disease progression.  INTERVAL HISTORY:  Donna May is here for a follow up of cholangiocarcinomaof liver. She was last seen by me 07/11/20. She presents to the clinic alone. She notes she was recently seen by physician for her neuropathy. She was offered shock treatment, light treatment and exercise. She was also offered only being on Gabapentin or Cymbalta and Vit B complete. She notes it was not guaranteed to cure it, but she is still interested. She notes she would have to pay for 1 year worth of treatment. I reviewed her medication list with her. She notes her Neurologist notes Statin is not good for her neuropathy. She was told she is fine to take Crestor. She notes she did not take her HTN meds yet today. Her BP is elevated today.    REVIEW OF SYSTEMS:   Constitutional: Denies fevers, chills or abnormal weight loss Eyes: Denies blurriness of vision Ears, nose, mouth, throat, and face: Denies mucositis or sore throat Respiratory: Denies cough, dyspnea or wheezes Cardiovascular: Denies palpitation, chest discomfort or lower extremity  swelling Gastrointestinal:  Denies nausea, heartburn or change in bowel habits Skin: Denies abnormal skin rashes Lymphatics: Denies new lymphadenopathy or easy bruising Neurological: (+) Neuropathy in feet>hands  Behavioral/Psych: Mood is stable, no new changes  All other systems were reviewed  with the patient and are negative.  MEDICAL HISTORY:  Past Medical History:  Diagnosis Date  . Anemia   . Anxiety   . Arthritis   . Breast cancer ([place]) 1992   right  . Cataract    Bilateral eyes - surgery to remove  . Chronic diastolic CHF (congestive heart failure) ([place])   . Chronic kidney disease    CKD stage 3  . Complication of anesthesia    "something they use make me itch for a couple of days."  . Depression   . Diabetes mellitus    type 2  . Duodenitis 01/18/2002  . Fainting spell   . GERD (gastroesophageal reflux disease)   . Heart murmur    never has caused any problems  . Hiatal hernia 08/08/2008, 01/18/2002  . History of pneumonia    x 2  . Hyperlipidemia   . Hypertension   . Liver cancer ([place]) 08/2018   chemotherapy  . Lymphedema 2017   Right arm  . Peripheral neuropathy     SURGICAL HISTORY: Past Surgical History:  Procedure Laterality Date  . AXILLARY SURGERY     cyst removal, right  . COLONOSCOPY  09/23/2018   Dr. Havery Moros - polyps  . EYE SURGERY Bilateral    cataracts to remove  . LAPAROSCOPY N/A 11/17/2018   Procedure: LAPAROSCOPY DIAGNOSTIC, INTRAOPERATIVE ULTRASOUND, PERITONEAL BIOPSIES;  Surgeon: [name], MD;  Location: [facility];  Service: General;  Laterality: N/A;  GENERAL AND EPIDURAL  . LIVER BIOPSY  08/2018   Dr. Lindwood Coke  . MASTECTOMY  1992   right, with flap  . NM MYOCAR PERF WALL MOTION  06/11/2009   Protocol:[place], post stress EF58%, EKG negative for ischemia, low risk  . PORTACATH PLACEMENT N/A 12/02/2018   Procedure: INSERTION PORT-A-CATH;  Surgeon: [name], MD;  Location: [facility];  Service: General;  Laterality: N/A;  .  RECONSTRUCTION BREAST W/ TRAM FLAP Right   . TONSILLECTOMY  1958  . TRANSTHORACIC ECHOCARDIOGRAM  12/24/2009   LVEF =>55%, normal study  . UPPER GI ENDOSCOPY      I have reviewed the social history and family history with the patient and they are unchanged from previous note.  ALLERGIES:  has No Known Allergies.  MEDICATIONS:  Current Outpatient Medications  Medication Sig Dispense Refill  . amLODipine (NORVASC) 10 MG tablet Take 1 tablet (10 mg total) by mouth daily. 90 tablet 3  . aspirin 81 MG tablet Take 81 mg by mouth daily.     . carvedilol (COREG) 25 MG tablet Take 1 tablet (25 mg total) by mouth 2 (two) times daily with a meal. 180 tablet 3  . furosemide (LASIX) 40 MG tablet Take 1 tablet (40 mg total) by mouth daily. 30 tablet 11  . gabapentin (NEURONTIN) 100 MG capsule Take 1 capsule (100 mg total) by mouth every 8 (eight) hours as needed. 180 capsule 2  . glucose blood test strip 1 each by Other route 2 (two) times daily. Use Onetouch verio test strips as instructed to check blood sugar twice daily. 100 each 2  . hydrALAZINE (APRESOLINE) 100 MG tablet TAKE THREE TIMES DAILY-AM, MIDDLE OF THE DAY AND IN THE EVENING. 90 tablet 3  . KLOR-CON M10 10 MEQ tablet TAKE 1 TABLET BY MOUTH EVERY DAY 90 tablet 1  . lansoprazole (PREVACID) 15 MG capsule Take 1 capsule (15 mg total) by mouth daily. 90 capsule 1  . loratadine (CLARITIN) 10 MG tablet Take 10 mg by mouth daily as  needed for allergies.    . Omega 3 1000 MG CAPS Take 2,000 mg by mouth daily.     . rosuvastatin (CRESTOR) 20 MG tablet Take 1 tablet (20 mg total) by mouth daily. 90 tablet 3  . sitaGLIPtin (JANUVIA) 100 MG tablet Take 1 tablet (100 mg total) by mouth daily. 90 tablet 3  . valsartan (DIOVAN) 320 MG tablet Take 1 tablet (320 mg total) by mouth daily. 90 tablet 3   Current Facility-Administered Medications  Medication Dose Route Frequency Provider Last Rate Last Admin  . triamcinolone acetonide (KENALOG) 10 MG/ML  injection 10 mg  10 mg Other Once Harriet Masson, DPM        PHYSICAL EXAMINATION: ECOG PERFORMANCE STATUS: 2 - Symptomatic, <50% confined to bed  Vitals:   07/24/20 0918  BP: (!) 178/68  Pulse: (!) 59  Resp: 18  Temp: (!) 96.9 F (36.1 C)  SpO2: 100%   Filed Weights   07/24/20 0918  Weight: 129 lb 12.8 oz (58.9 kg)    Due to COVID19 we will limit examination to appearance. Patient had no complaints.  GENERAL:alert, no distress and comfortable SKIN: skin color normal, no rashes or significant lesions EYES: normal, Conjunctiva are pink and non-injected, sclera clear  NEURO: alert & oriented x 3 with fluent speech   LABORATORY DATA:  I have reviewed the data as listed CBC Latest Ref Rng & Units 07/24/2020 07/11/2020 06/26/2020  WBC 4.0 - 10.5 K/uL 5.1 5.5 5.9  Hemoglobin 12.0 - 15.0 g/dL 8.9(L) 9.1(L) 8.7(L)  Hematocrit 36.0 - 46.0 % 28.1(L) 27.6(L) 26.6(L)  Platelets 150 - 400 K/uL 138(L) 168 155     CMP Latest Ref Rng & Units 07/24/2020 07/11/2020 06/26/2020  Glucose 70 - 99 mg/dL 91 92 104(H)  BUN 8 - 23 mg/dL 40(H) 61(H) 51(H)  Creatinine 0.44 - 1.00 mg/dL 1.48(H) 2.09(H) 1.70(H)  Sodium 135 - 145 mmol/L 143 141 142  Potassium 3.5 - 5.1 mmol/L 4.6 5.1 4.8  Chloride 98 - 111 mmol/L 110 106 108  CO2 22 - 32 mmol/L _0 Calcium 8.9 - 10.3 mg/dL 9.0 9.0 9.1  Total Protein 6.5 - 8.1 g/dL 6.8 7.1 7.0  Total Bilirubin 0.3 - 1.2 mg/dL 0.2(L) <0.2(L) 0.3  Alkaline Phos 38 - 126 U/L 50 55 54  AST 15 - 41 U/L _1 ALT 0 - 44 U/L _2 RADIOGRAPHIC STUDIES: I have personally reviewed the radiological images as listed and agreed with the findings in the report. No results found.   ASSESSMENT & PLAN:  Donna May is a 80 y.o. female with   1.Intrahepatic cholangiocarcinoma, cT1N0M1,with peritoneal metastasis, MSS, IDH1 mutation (+) -She was diagnosed in 08/2018. Her biopsy of her liver mass shows adenocarcinoma,most consistent  withcholangiocarcinoma.Her EGD/Colonoscopy from 7/10/20was negativeand 09/29/18 PET scanshowedno evidence of distant metastasis. -She was brought to OR on 11/17/18,unfortunately she was found to have peritoneal metastasis to right diaphragm and surgery was aborted. -Given her metastatic cancer, Istarted her onsystemicchemo withFirst line chemoOxaliplatinand gemcitabine q2weeksto control her diseasebeginningon 11/30/18.Due to poor tolerance to Oxaliplatin and Cisplatin from neuropathy, she proceeded withsingleagent Gemcitabineevery 2 weeksstarting 06/28/19 with C16.Shewas onchemo break since10/2021-03/2020. -GivenPET from 03/14/20 showedconcern for new liver lesion, I restarted her onmaintenance Gemcitabine q2 weeksbeginning1/19/22.Her 06/11/20 PET shows she is overall stable.  -She continues to tolerate Gemcitabine well. Labs reviewed, Hg 8.9, plt 138K, BUN 40, Cr 1.48, Albumin 3.4. Overall adequate to proceed withGemcitabine today at  same dose.  -Given she is very stable on treatment, she is fine to see me every other treatment at every 4 weeks.   2. Anemia,Secondary to chemoand cancer -Shepreviouslyrequiredblood transfusionsince 03/2019, continue as needed. Will continue prenatal vitamin. -Lastblood transfusion10/25/21. -Hg has been stable in 9 rangeoff chemo. With restart of chemo, Hg has dropped to [place].If she is symptomatic or labs drop below 8, will give blood transfusion. -Stable and moderate recently.  3.Peripheral neuropathy, G3, Secondary to Oxaliplatinand Cisplatin (both d/c) -Due to low tolerance and not much help, she stopped Lyrica,Gabapentinand Cymbalta. -She waspreviouslyseen by Dr Mickeal Skinner.She is now being seen byChiropractor Dr Page Spiro. -She remains to have decreased function overall.Stablewith occasional flares. -ContinueGabapentin 162m to use at night as needed.  -[place] mostly physical shock treatment, light treatment and exercise. She is open to this although not covered by insurance and cost $6000 for 1 year. I recommend she pay for 3 months at a time given she is on active cancer treatment and changes may need to be made.   4. H/o right breast cancer, Geneticsnegative, Osteopenia -s/prightmastectomy in 1992, per patient did not require adjuvant therapy. She was last seen by Dr. MJana Hakimin 2017. -Her4/2019 DEXA shows mild osteopenia. F/u with Gyn about continuing Raloxifene. I do not think she has to continue at this point.  5.Comorbidities:DM,HTN,hiatal hernia, GERD, renal artery stenosis,2021pulmonary edema -Continue medications and f/u with PCP and cardiologist for management. -She is on torsemide twice a week, and hydrochlorothiazide daily  6. Family support, Goal of Care Discussion -She has a son and daughterwho are often busy but help as needed. -The patient understands the goal of care is palliative.She is full code now  7.CKD stage III -Continue to monitor.Stable Cr at 1.2-15.   PLAN: -Labs reviewed and adequate to proceed with Gemcitabine todayat same dose. -Lab, Flush, and Gemcitabine in 2, 4, 6, 8 weeks  -F/u in 4 and 8 weeks.    No problem-specific Assessment & Plan notes found for this encounter.   No orders of the defined types were placed in this encounter.  All questions were answered. The patient knows to call the clinic with any problems, questions or concerns. No barriers to learning was detected. The total time spent in the appointment was 30 minutes.     YTruitt Merle MD 07/24/2020   I, AJoslyn Devon am acting as scribe for YTruitt Merle MD.   I have reviewed the above documentation for accuracy and completeness, and I agree with the above.

## 2020-07-22 ENCOUNTER — Ambulatory Visit (INDEPENDENT_AMBULATORY_CARE_PROVIDER_SITE_OTHER): Payer: Medicare Other

## 2020-07-22 ENCOUNTER — Other Ambulatory Visit: Payer: Self-pay

## 2020-07-22 DIAGNOSIS — N1832 Chronic kidney disease, stage 3b: Secondary | ICD-10-CM

## 2020-07-22 DIAGNOSIS — E1122 Type 2 diabetes mellitus with diabetic chronic kidney disease: Secondary | ICD-10-CM

## 2020-07-22 NOTE — Progress Notes (Signed)
Patient in office today for diabetic shoe measurement and selection. Shoe size 10 women's and width is wide medium. Provider treating diabetes is Dr. Grier Mitts. Patient selected shoe [id number] (Apex breeze knit) as option 1 and [id number] (Apex Miranda) as option 2. Patient was advised that the office will call to schedule an appointment when the shoes are ready for pick-up. Patient verbalized understanding.

## 2020-07-24 ENCOUNTER — Other Ambulatory Visit: Payer: Medicare Other

## 2020-07-24 ENCOUNTER — Inpatient Hospital Stay: Payer: Medicare Other

## 2020-07-24 ENCOUNTER — Encounter: Payer: Self-pay | Admitting: Hematology

## 2020-07-24 ENCOUNTER — Other Ambulatory Visit: Payer: Self-pay

## 2020-07-24 ENCOUNTER — Inpatient Hospital Stay: Payer: Medicare Other | Attending: Adult Health | Admitting: Hematology

## 2020-07-24 ENCOUNTER — Telehealth: Payer: Self-pay | Admitting: Hematology

## 2020-07-24 VITALS — BP 162/67

## 2020-07-24 VITALS — BP 178/68 | HR 59 | Temp 96.9°F | Resp 18 | Wt 129.8 lb

## 2020-07-24 DIAGNOSIS — C7989 Secondary malignant neoplasm of other specified sites: Secondary | ICD-10-CM | POA: Diagnosis not present

## 2020-07-24 DIAGNOSIS — C221 Intrahepatic bile duct carcinoma: Secondary | ICD-10-CM

## 2020-07-24 DIAGNOSIS — G620 Drug-induced polyneuropathy: Secondary | ICD-10-CM | POA: Insufficient documentation

## 2020-07-24 DIAGNOSIS — D6481 Anemia due to antineoplastic chemotherapy: Secondary | ICD-10-CM | POA: Diagnosis not present

## 2020-07-24 DIAGNOSIS — Z5111 Encounter for antineoplastic chemotherapy: Secondary | ICD-10-CM | POA: Diagnosis not present

## 2020-07-24 DIAGNOSIS — Z95828 Presence of other vascular implants and grafts: Secondary | ICD-10-CM

## 2020-07-24 DIAGNOSIS — I248 Other forms of acute ischemic heart disease: Secondary | ICD-10-CM | POA: Diagnosis not present

## 2020-07-24 DIAGNOSIS — C786 Secondary malignant neoplasm of retroperitoneum and peritoneum: Secondary | ICD-10-CM | POA: Diagnosis not present

## 2020-07-24 DIAGNOSIS — D649 Anemia, unspecified: Secondary | ICD-10-CM

## 2020-07-24 LAB — CMP (CANCER CENTER ONLY)
ALT: 9 U/L (ref 0–44)
AST: 20 U/L (ref 15–41)
Albumin: 3.4 g/dL — ABNORMAL LOW (ref 3.5–5.0)
Alkaline Phosphatase: 50 U/L (ref 38–126)
Anion gap: 8 (ref 5–15)
BUN: 40 mg/dL — ABNORMAL HIGH (ref 8–23)
CO2: 25 mmol/L (ref 22–32)
Calcium: 9 mg/dL (ref 8.9–10.3)
Chloride: 110 mmol/L (ref 98–111)
Creatinine: 1.48 mg/dL — ABNORMAL HIGH (ref 0.44–1.00)
GFR, Estimated: 35 mL/min — ABNORMAL LOW (ref >60)
Glucose, Bld: 91 mg/dL (ref 70–99)
Potassium: 4.6 mmol/L (ref 3.5–5.1)
Sodium: 143 mmol/L (ref 135–145)
Total Bilirubin: 0.2 mg/dL — ABNORMAL LOW (ref 0.3–1.2)
Total Protein: 6.8 g/dL (ref 6.5–8.1)

## 2020-07-24 LAB — CBC WITH DIFFERENTIAL (CANCER CENTER ONLY)
Abs Immature Granulocytes: 0.01 10*3/uL (ref 0.00–0.07)
Basophils Absolute: 0 10*3/uL (ref 0.0–0.1)
Basophils Relative: 0 %
Eosinophils Absolute: 0.2 10*3/uL (ref 0.0–0.5)
Eosinophils Relative: 4 %
HCT: 28.1 % — ABNORMAL LOW (ref 36.0–46.0)
Hemoglobin: 8.9 g/dL — ABNORMAL LOW (ref 12.0–15.0)
Immature Granulocytes: 0 %
Lymphocytes Relative: 26 %
Lymphs Abs: 1.3 10*3/uL (ref 0.7–4.0)
MCH: 33 pg (ref 26.0–34.0)
MCHC: 31.7 g/dL (ref 30.0–36.0)
MCV: 104.1 fL — ABNORMAL HIGH (ref 80.0–100.0)
Monocytes Absolute: 0.9 10*3/uL (ref 0.1–1.0)
Monocytes Relative: 17 %
Neutro Abs: 2.7 10*3/uL (ref 1.7–7.7)
Neutrophils Relative %: 53 %
Platelet Count: 138 10*3/uL — ABNORMAL LOW (ref 150–400)
RBC: 2.7 MIL/uL — ABNORMAL LOW (ref 3.87–5.11)
RDW: 16.6 % — ABNORMAL HIGH (ref 11.5–15.5)
WBC Count: 5.1 10*3/uL (ref 4.0–10.5)
nRBC: 0 % (ref 0.0–0.2)

## 2020-07-24 LAB — SAMPLE TO BLOOD BANK

## 2020-07-24 MED ORDER — SODIUM CHLORIDE 0.9% FLUSH
10.0000 mL | Freq: Once | INTRAVENOUS | Status: AC
  Administered 2020-07-24: 10 mL
  Filled 2020-07-24: qty 10

## 2020-07-24 MED ORDER — SODIUM CHLORIDE 0.9% FLUSH
10.0000 mL | INTRAVENOUS | Status: DC | PRN
  Administered 2020-07-24: 10 mL
  Filled 2020-07-24: qty 10

## 2020-07-24 MED ORDER — SODIUM CHLORIDE 0.9 % IV SOLN
600.0000 mg/m2 | Freq: Once | INTRAVENOUS | Status: AC
  Administered 2020-07-24: 988 mg via INTRAVENOUS
  Filled 2020-07-24: qty 25.98

## 2020-07-24 MED ORDER — SODIUM CHLORIDE 0.9 % IV SOLN
Freq: Once | INTRAVENOUS | Status: AC
  Filled 2020-07-24: qty 250

## 2020-07-24 MED ORDER — PROCHLORPERAZINE MALEATE 10 MG PO TABS
10.0000 mg | ORAL_TABLET | Freq: Once | ORAL | Status: AC
  Administered 2020-07-24: 10 mg via ORAL

## 2020-07-24 MED ORDER — HEPARIN SOD (PORK) LOCK FLUSH 100 UNIT/ML IV SOLN
500.0000 [IU] | Freq: Once | INTRAVENOUS | Status: AC | PRN
  Administered 2020-07-24: 500 [IU]
  Filled 2020-07-24: qty 5

## 2020-07-24 MED ORDER — SODIUM CHLORIDE 0.9 % IV SOLN: 600.0000 mg/m2 | Freq: Once | INTRAVENOUS | Status: DC

## 2020-07-24 MED ORDER — PROCHLORPERAZINE MALEATE 10 MG PO TABS
ORAL_TABLET | ORAL | Status: AC
  Filled 2020-07-24: qty 1

## 2020-07-24 NOTE — Telephone Encounter (Signed)
Scheduled follow-up appointment per 5/11 los. Patient is aware. ?

## 2020-07-25 ENCOUNTER — Ambulatory Visit: Payer: Medicare Other

## 2020-07-25 ENCOUNTER — Ambulatory Visit: Payer: Medicare Other | Admitting: Hematology

## 2020-07-25 ENCOUNTER — Other Ambulatory Visit: Payer: Medicare Other

## 2020-08-07 ENCOUNTER — Other Ambulatory Visit: Payer: Medicare Other

## 2020-08-07 ENCOUNTER — Ambulatory Visit: Payer: Medicare Other

## 2020-08-07 ENCOUNTER — Ambulatory Visit: Payer: Medicare Other | Admitting: Hematology

## 2020-08-08 ENCOUNTER — Other Ambulatory Visit: Payer: Self-pay

## 2020-08-08 ENCOUNTER — Ambulatory Visit: Payer: Medicare Other | Admitting: Hematology

## 2020-08-08 ENCOUNTER — Inpatient Hospital Stay: Payer: Medicare Other

## 2020-08-08 VITALS — BP 169/68 | HR 58 | Temp 98.2°F | Resp 18 | Wt 128.8 lb

## 2020-08-08 DIAGNOSIS — G620 Drug-induced polyneuropathy: Secondary | ICD-10-CM | POA: Diagnosis not present

## 2020-08-08 DIAGNOSIS — D649 Anemia, unspecified: Secondary | ICD-10-CM

## 2020-08-08 DIAGNOSIS — Z5111 Encounter for antineoplastic chemotherapy: Secondary | ICD-10-CM | POA: Diagnosis not present

## 2020-08-08 DIAGNOSIS — C7989 Secondary malignant neoplasm of other specified sites: Secondary | ICD-10-CM | POA: Diagnosis not present

## 2020-08-08 DIAGNOSIS — C221 Intrahepatic bile duct carcinoma: Secondary | ICD-10-CM | POA: Diagnosis not present

## 2020-08-08 DIAGNOSIS — D6481 Anemia due to antineoplastic chemotherapy: Secondary | ICD-10-CM | POA: Diagnosis not present

## 2020-08-08 DIAGNOSIS — Z95828 Presence of other vascular implants and grafts: Secondary | ICD-10-CM

## 2020-08-08 DIAGNOSIS — C786 Secondary malignant neoplasm of retroperitoneum and peritoneum: Secondary | ICD-10-CM | POA: Diagnosis not present

## 2020-08-08 LAB — CBC WITH DIFFERENTIAL (CANCER CENTER ONLY)
Abs Immature Granulocytes: 0.02 10*3/uL (ref 0.00–0.07)
Basophils Absolute: 0 10*3/uL (ref 0.0–0.1)
Basophils Relative: 0 %
Eosinophils Absolute: 0.3 10*3/uL (ref 0.0–0.5)
Eosinophils Relative: 5 %
HCT: 29 % — ABNORMAL LOW (ref 36.0–46.0)
Hemoglobin: 9.3 g/dL — ABNORMAL LOW (ref 12.0–15.0)
Immature Granulocytes: 0 %
Lymphocytes Relative: 20 %
Lymphs Abs: 1 10*3/uL (ref 0.7–4.0)
MCH: 33.3 pg (ref 26.0–34.0)
MCHC: 32.1 g/dL (ref 30.0–36.0)
MCV: 103.9 fL — ABNORMAL HIGH (ref 80.0–100.0)
Monocytes Absolute: 0.8 10*3/uL (ref 0.1–1.0)
Monocytes Relative: 16 %
Neutro Abs: 2.9 10*3/uL (ref 1.7–7.7)
Neutrophils Relative %: 59 %
Platelet Count: 197 10*3/uL (ref 150–400)
RBC: 2.79 MIL/uL — ABNORMAL LOW (ref 3.87–5.11)
RDW: 16.2 % — ABNORMAL HIGH (ref 11.5–15.5)
WBC Count: 5 10*3/uL (ref 4.0–10.5)
nRBC: 0 % (ref 0.0–0.2)

## 2020-08-08 LAB — CMP (CANCER CENTER ONLY)
ALT: 10 U/L (ref 0–44)
AST: 18 U/L (ref 15–41)
Albumin: 3.5 g/dL (ref 3.5–5.0)
Alkaline Phosphatase: 55 U/L (ref 38–126)
Anion gap: 7 (ref 5–15)
BUN: 35 mg/dL — ABNORMAL HIGH (ref 8–23)
CO2: 26 mmol/L (ref 22–32)
Calcium: 9 mg/dL (ref 8.9–10.3)
Chloride: 112 mmol/L — ABNORMAL HIGH (ref 98–111)
Creatinine: 1.41 mg/dL — ABNORMAL HIGH (ref 0.44–1.00)
GFR, Estimated: 37 mL/min — ABNORMAL LOW (ref >60)
Glucose, Bld: 86 mg/dL (ref 70–99)
Potassium: 4.7 mmol/L (ref 3.5–5.1)
Sodium: 145 mmol/L (ref 135–145)
Total Bilirubin: 0.2 mg/dL — ABNORMAL LOW (ref 0.3–1.2)
Total Protein: 6.9 g/dL (ref 6.5–8.1)

## 2020-08-08 LAB — SAMPLE TO BLOOD BANK

## 2020-08-08 MED ORDER — SODIUM CHLORIDE 0.9 % IV SOLN
600.0000 mg/m2 | Freq: Once | INTRAVENOUS | Status: AC
  Administered 2020-08-08: 988 mg via INTRAVENOUS
  Filled 2020-08-08: qty 25.98

## 2020-08-08 MED ORDER — SODIUM CHLORIDE 0.9% FLUSH
10.0000 mL | INTRAVENOUS | Status: DC | PRN
  Administered 2020-08-08: 10 mL
  Filled 2020-08-08: qty 10

## 2020-08-08 MED ORDER — SODIUM CHLORIDE 0.9 % IV SOLN
Freq: Once | INTRAVENOUS | Status: AC
  Filled 2020-08-08: qty 250

## 2020-08-08 MED ORDER — HEPARIN SOD (PORK) LOCK FLUSH 100 UNIT/ML IV SOLN
500.0000 [IU] | Freq: Once | INTRAVENOUS | Status: AC | PRN
  Administered 2020-08-08: 500 [IU]
  Filled 2020-08-08: qty 5

## 2020-08-08 MED ORDER — SODIUM CHLORIDE 0.9% FLUSH
10.0000 mL | Freq: Once | INTRAVENOUS | Status: AC
Start: 2020-08-08 — End: 2020-08-08
  Administered 2020-08-08: 10 mL
  Filled 2020-08-08: qty 10

## 2020-08-08 MED ORDER — PROCHLORPERAZINE MALEATE 10 MG PO TABS
10.0000 mg | ORAL_TABLET | Freq: Once | ORAL | Status: AC
  Administered 2020-08-08: 10 mg via ORAL

## 2020-08-08 NOTE — Patient Instructions (Signed)
[facility]  Discharge Instructions: Thank you for choosing [facility] to provide your oncology and hematology care.   If you have a lab appointment with the Cancer Center, please go directly to the Cancer Center and check in at the registration area.   Wear comfortable clothing and clothing appropriate for easy access to any Portacath or PICC line.   We strive to give you quality time with your provider. You may need to reschedule your appointment if you arrive late (15 or more minutes).  Arriving late affects you and other patients whose appointments are after yours.  Also, if you miss three or more appointments without notifying the office, you may be dismissed from the clinic at the provider's discretion.      For prescription refill requests, have your pharmacy contact our office and allow 72 hours for refills to be completed.    Today you received the following chemotherapy and/or immunotherapy agents Gemzar      To help prevent nausea and vomiting after your treatment, we encourage you to take your nausea medication as directed.  BELOW ARE SYMPTOMS THAT SHOULD BE REPORTED IMMEDIATELY: *FEVER GREATER THAN 100.4 F (38 C) OR HIGHER *CHILLS OR SWEATING *NAUSEA AND VOMITING THAT IS NOT CONTROLLED WITH YOUR NAUSEA MEDICATION *UNUSUAL SHORTNESS OF BREATH *UNUSUAL BRUISING OR BLEEDING *URINARY PROBLEMS (pain or burning when urinating, or frequent urination) *BOWEL PROBLEMS (unusual diarrhea, constipation, pain near the anus) TENDERNESS IN MOUTH AND THROAT WITH OR WITHOUT PRESENCE OF ULCERS (sore throat, sores in mouth, or a toothache) UNUSUAL RASH, SWELLING OR PAIN  UNUSUAL VAGINAL DISCHARGE OR ITCHING   Items with * indicate a potential emergency and should be followed up as soon as possible or go to the Emergency Department if any problems should occur.  Please show the CHEMOTHERAPY ALERT CARD or IMMUNOTHERAPY ALERT CARD at check-in to the  Emergency Department and triage nurse.  Should you have questions after your visit or need to cancel or reschedule your appointment, please contact [facility]  Dept: [phone]  and follow the prompts.  Office hours are 8:00 a.m. to 4:30 p.m. Monday - Friday. Please note that voicemails left after 4:00 p.m. may not be returned until the following business day.  We are closed weekends and major holidays. You have access to a nurse at all times for urgent questions. Please call the main number to the clinic Dept: [phone] and follow the prompts.   For any non-urgent questions, you may also contact your provider using MyChart. We now offer e-Visits for anyone 18 and older to request care online for non-urgent symptoms. For details visit [email or web address].   Also download the MyChart app! Go to the app store, search "MyChart", open the app, select [facility], and log in with your MyChart username and password.  Due to Covid, a mask is required upon entering the hospital/clinic. If you do not have a mask, one will be given to you upon arrival. For doctor visits, patients may have 1 support person aged 18 or older with them. For treatment visits, patients cannot have anyone with them due to current Covid guidelines and our immunocompromised population.   

## 2020-08-16 DIAGNOSIS — H0288A Meibomian gland dysfunction right eye, upper and lower eyelids: Secondary | ICD-10-CM | POA: Diagnosis not present

## 2020-08-16 DIAGNOSIS — H0102A Squamous blepharitis right eye, upper and lower eyelids: Secondary | ICD-10-CM | POA: Diagnosis not present

## 2020-08-16 DIAGNOSIS — H00021 Hordeolum internum right upper eyelid: Secondary | ICD-10-CM | POA: Diagnosis not present

## 2020-08-16 DIAGNOSIS — H0288B Meibomian gland dysfunction left eye, upper and lower eyelids: Secondary | ICD-10-CM | POA: Diagnosis not present

## 2020-08-16 DIAGNOSIS — H0102B Squamous blepharitis left eye, upper and lower eyelids: Secondary | ICD-10-CM | POA: Diagnosis not present

## 2020-08-16 NOTE — Progress Notes (Signed)
[place]   [phone] [phone]   Clinic Follow up Note   Patient Care Team: [name], MD as PCP - General (Family Medicine) [name], [name], MD as PCP - Cardiology (Cardiology) [name], [name], MD as Consulting Physician (Cardiology) [name], MD as Consulting Physician (Obstetrics and Gynecology) [name], [name], NP as Nurse Practitioner (Hematology and Oncology) [facility] [name], MD as Consulting Physician (Hematology) [name], [name], MD as Consulting Physician (Gastroenterology) Virgina Evener, Dawn, RN (Inactive) as Oncology Nurse Navigator [name], MD as Consulting Physician (General Surgery)  Date of Service:  08/21/2020  CHIEF COMPLAINT: f/u ofcholangiocarcinomaof liver  SUMMARY OF ONCOLOGIC HISTORY: Oncology History Overview Note  Cancer Staging Intrahepatic cholangiocarcinoma ([place]) Staging form: Intrahepatic Bile Duct, AJCC 8th Edition - Clinical stage from 09/23/2018: Stage IB (cT1b, cN0, cM0) - Signed by [name], MD on 10/06/2018    Intrahepatic cholangiocarcinoma ([place])  09/07/2018 Imaging   CT Chest IMPRESSION: 1. New, enhancing mass involving segment 4 of the liver and fundus of gallbladder is concerning for malignancy. This may represent either metastatic disease from breast cancer or neoplasm primary to the liver or hepatic biliary tree. Further evaluation with contrast enhanced CT of the abdomen and pelvis is recommended. 2. No findings to suggest metastatic disease within the chest. 3.  Aortic Atherosclerosis (ICD10-I70.0). 4. Coronary artery calcifications.   09/13/2018 Pathology Results   Diagnosis Liver, needle/core biopsy - ADENOCARCINOMA. Microscopic Comment Immunohistochemistry for CK7 is positive. CK20, TTF1, CDX-2, GATA-3, PAX 8, Qualitative ER, p63 and CK5/6 are negative. The provided clinical history of remote mammary carcinoma is noted. Based on the  morphology and immunophenotype of the adenocarcinoma observed in this specimen, primary cholangiocarcinoma is favored. Clinical and radiologic correlation are  encouraged. Results reported to [employer] on 09/15/2018. Intradepartmental consultation (Dr. Vic Ripper).   09/13/2018 Initial Diagnosis   Cholangiocarcinoma ([place])   09/23/2018 Procedure   Colonoscopy by Dr. Havery Moros 09/23/18  IMPRESSION - Two 3 to 4 mm polyps in the ascending colon, removed with a cold snare. Resected and retrieved. - Five 3 to 5 mm polyps in the transverse colon, removed with a cold snare. Resected and retrieved. - One 5 mm polyp at the splenic flexure, removed with a cold snare. Resected and retrieved. - Three 3 to 5 mm polyps in the sigmoid colon, removed with a cold snare. Resected and retrieved. - The examination was otherwise normal. Upper Endopscy by Dr. Havery Moros 09/23/18  IMPRESSION - Esophagogastric landmarks identified. - 2 cm hiatal hernia. - Normal esophagus otherwise. - A single gastric polyp. Resected and retrieved. - Mild gastritis. Biopsied. - Normal duodenal bulb and second portion of the duodenum.   09/23/2018 Pathology Results   Diagnosis 09/23/18 1. Surgical [P], duodenum - BENIGN SMALL BOWEL MUCOSA. - NO ACTIVE INFLAMMATION OR VILLOUS ATROPHY IDENTIFIED. 2. Surgical [P], stomach, polyp - HYPERPLASTIC POLYP(S). - THERE IS NO EVIDENCE OF MALIGNANCY. 3. Surgical [P], gastric antrum and gastric body - CHRONIC INACTIVE GASTRITIS. - THERE IS NO EVIDENCE OF HELICOBACTER-PYLORI, DYSPLASIA, OR MALIGNANCY. - SEE COMMENT. 4. Surgical [P], colon, sigmoid, splenic flexure, transverse and ascending, polyp (9) - TUBULAR ADENOMA(S). - SESSILE SERRATED POLYP WITHOUT CYTOLOGIC DYSPLASIA. - HIGH GRADE DYSPLASIA IS NOT IDENTIFIED. 5. Surgical [P], colon, sigmoid, polyp (2) - HYPERPLASTIC POLYP(S). - THERE IS NO EVIDENCE OF MALIGNANCY.   09/23/2018 Cancer Staging   Staging form: Intrahepatic Bile  Duct, AJCC 8th Edition - Clinical stage from 09/23/2018: Stage IB (cT1b, cN0, cM0) - Signed  by [name], MD on 10/06/2018   09/29/2018 PET scan   PET 09/29/18 IMPRESSION: 1. Hypermetabolic mass in the RIGHT hepatic lobe consistent with biopsy proven adenocarcinoma. No additional liver metastasis. 2. No evidence of local breast cancer recurrence in the RIGHT breast or RIGHT axilla. 3. Mild bilateral hypermetabolic adrenal glands is favored benign hyperplasia. 4. No evidence of additional metastatic disease on skull base to thigh FDG PET scan.   11/06/2018 Genetic Testing   Negative genetic testing on the [employer] Common Hereditary Cancers panel. A variant of uncertain significance was identified in one of her APC genes, called c.1243G>A (p.Ala415Thr).  The Common Hereditary Cancers Panel offered by [employer] includes sequencing and/or deletion duplication testing of the following 48 genes: APC, ATM, AXIN2, BARD1, BMPR1A, BRCA1, BRCA2, BRIP1, CDH1, CDK4, CDKN2A (p14ARF), CDKN2A (p16INK4a), CHEK2, CTNNA1, DICER1, EPCAM (Deletion/duplication testing only), GREM1 (promoter region deletion/duplication testing only), KIT, MEN1, MLH1, MSH2, MSH3, MSH6, MUTYH, NBN, NF1, NHTL1, PALB2, PDGFRA, PMS2, POLD1, POLE, PTEN, RAD50, RAD51C, RAD51D, RNF43, SDHB, SDHC, SDHD, SMAD4, SMARCA4. STK11, TP53, TSC1, TSC2, and VHL.  The following genes were evaluated for sequence changes only: SDHA and HOXB13 c.251G>A variant only.    11/17/2018 Pathology Results   Diagnosis 11/17/18 1. Soft tissue, biopsy, Diaphragmatic nodules - METASTATIC ADENOCARCINOMA, CONSISTENT WITH PATIENT'S CLINICAL HISTORY OF CHOLANGIOCARCINOMA. SEE NOTE 2. Liver, biopsy, Left - LIVER PARENCHYMA WITH A BENIGN FIBROTIC NODULE - NO EVIDENCE OF MALIGNANCY 3. Stomach, biopsy - BENIGN PAPILLARY MESOTHELIAL HYPERPLASIA - NO EVIDENCE OF MALIGNANCY   11/29/2018 Imaging   CT CAP WO Contrast  IMPRESSION: 1. Dominant liver mass appears grossly stable from  09/29/2018. Additional liver lesions are too small to characterize but were not shown to be hypermetabolic on PET. 2. Mild nodularity of both adrenal glands with associated hypermetabolism on 09/29/2018. Continued attention on follow-up exams is warranted. 3. Small right lower lobe nodules, stable from 09/07/2018. Again, attention on follow-up is recommended. 4. Trace bilateral pleural fluid. 5. Aortic atherosclerosis (ICD10-170.0). Coronary artery calcification. 6. Enlarged pulmonic trunk, indicative of pulmonary arterial hypertension.     11/30/2018 - 06/14/2019 Chemotherapy   First line chemo Oxaliplatin and gemcitabine q2weeks starting 11/30/18. Stopped Oxaliplatin on 05/03/19 due to worsening neuropathy. Added Cisplatin with C13 on 05/17/19 and stopped on C15 06/14/19 due to neuropathy. Reduced to maintenance single agent Gemcitabine on 06/28/19.    01/20/2019 Imaging   CT CAP IMPRESSION: restaging  1. Mild interval increase in size of dominant liver mass involving segment 4 and segment 5. 2. No significant or progressive adrenal nodularity identified to suggest metastatic disease. 3. Unchanged appearance of small right lower lobe and lingular lung nodules, nonspecific.   03/21/2019 PET scan   IMPRESSION: 1. Large right hepatic mass with SUV uptake near background hepatic activity, dramatic response to therapy, also with decrease in size when compared to the prior study. 2. Signs of prior right mastectomy and axillary dissection as before. 3. Left adrenal activity in no longer above the level of activity seen in the contralateral, right adrenal gland or in the liver. Attention on follow-up. 4. No new signs of disease.   06/27/2019 PET scan   IMPRESSION: 1. Further decrease in size of a dominant hepatic mass which is non FDG avid. 2. No evidence of metastatic disease. 3. No evidence of left adrenal hypermetabolism or mass. 4. Incidental findings, including: Uterine fibroids.  Coronary artery atherosclerosis. Aortic Atherosclerosis (ICD10-I70.0). Pulmonary artery enlargement suggests pulmonary arterial hypertension.   06/28/2019 -  Chemotherapy   Maintenance single  agent Gemcitabine q2weeks starting on 06/28/19 with C16.  ------On chemo break since 12/27/19.  ------She restarted Single Agent Gemcitabine q2weeks on 04/03/20 due to mild disease progression in liver on 02/2020 PET    09/15/2019 PET scan   IMPRESSION: 1. In the region of regional tumor at the junction of the right and left hepatic lobe, there is continued hypodensity and overall activity slightly less than that of the surrounding normal liver, indicating effective response to therapy. No current worrisome hypermetabolic lesion is identified in the liver or elsewhere. 2. Chronic asymmetric edema in the subcutaneous tissues of the right upper extremity, nonspecific. The patient has had a prior right mastectomy and right axillary dissection. 3. Other imaging findings of potential clinical significance: Aortic Atherosclerosis (ICD10-I70.0). Coronary atherosclerosis. Mild cardiomegaly. Small right and trace left pleural effusions. Subcutaneous and mild mesenteric edema. Suspected uterine fibroids.   12/18/2019 PET scan   IMPRESSION: 1. No significant abnormal activity to suggest active/recurrent malignancy. 2. Lingual tonsillar activity is mildly increased from prior but probably incidental/physiologic, attention on follow up suggested. 3. Other imaging findings of potential clinical significance: Third spacing of fluid with trace ascites, mesenteric edema, subcutaneous edema, and possibly subtle pulmonary edema. Mild cardiomegaly. Aortic Atherosclerosis (ICD10-I70.0). Coronary atherosclerosis. Right ovarian dermoid. Calcified uterine fibroids. Renal cysts. Right glenohumeral arthropathy. Mild to moderate scoliosis. Suspected pulmonary arterial hypertension.   03/14/2020 PET scan   IMPRESSION: 1.  New small focus of hypermetabolism in segment 4A of the liver worrisome for new metastatic focus. 2. No other findings for metastatic disease involving the neck, chest, abdomen or pelvis.     06/11/2020 PET scan   IMPRESSION: 1. No suspicious hypermetabolic activity within the neck, chest, abdomen or pelvis. 2. The treated dominant peripheral cholangiocarcinoma appears slightly enlarged compared with recent prior studies, and there is a questionable new lesion more superiorly in the left lobe. Although without associated hypermetabolic activity, these lesions are suspicious and would be better assessed with abdominal MRI without and with contrast.      CURRENT THERAPY:  Maintenance single agent Gemcitabine q2weeks starting on 06/28/19 with C16.On chemo break since 12/27/19.She restarted Single Agent Gemcitabine q2weeks on 04/03/20 due to mild disease progression.  INTERVAL HISTORY:  Donna May is here for a follow up of cholangiocarcinomaof liver. She was last seen by me 07/24/20. She presents to the clinic alone. She notes she is mostly stable but her neuropathy is worse in her hands. She notes she did not proceed with treatment from [facility] yet. She notes recent arm pain, that has resolved on its own.     REVIEW OF SYSTEMS:   Constitutional: Denies fevers, chills or abnormal weight loss Eyes: Denies blurriness of vision Ears, nose, mouth, throat, and face: Denies mucositis or sore throat Respiratory: Denies cough, dyspnea or wheezes Cardiovascular: Denies palpitation, chest discomfort or lower extremity swelling Gastrointestinal:  Denies nausea, heartburn or change in bowel habits Skin: Denies abnormal skin rashes Lymphatics: Denies new lymphadenopathy or easy bruising Neurological: (+) Neuropathy in hands and feet, worsened in hands  Behavioral/Psych: Mood is stable, no new changes  All other systems were reviewed with the patient and are  negative.  MEDICAL HISTORY:  Past Medical History:  Diagnosis Date  . Anemia   . Anxiety   . Arthritis   . Breast cancer ([place]) 1992   right  . Cataract    Bilateral eyes - surgery to remove  . Chronic diastolic CHF (congestive heart failure) ([place])   . Chronic  kidney disease    CKD stage 3  . Complication of anesthesia    "something they use make me itch for a couple of days."  . Depression   . Diabetes mellitus    type 2  . Duodenitis 01/18/2002  . Fainting spell   . GERD (gastroesophageal reflux disease)   . Heart murmur    never has caused any problems  . Hiatal hernia 08/08/2008, 01/18/2002  . History of pneumonia    x 2  . Hyperlipidemia   . Hypertension   . Liver cancer ([place]) 08/2018   chemotherapy  . Lymphedema 2017   Right arm  . Peripheral neuropathy     SURGICAL HISTORY: Past Surgical History:  Procedure Laterality Date  . AXILLARY SURGERY     cyst removal, right  . COLONOSCOPY  09/23/2018   Dr. Havery Moros - polyps  . EYE SURGERY Bilateral    cataracts to remove  . LAPAROSCOPY N/A 11/17/2018   Procedure: LAPAROSCOPY DIAGNOSTIC, INTRAOPERATIVE ULTRASOUND, PERITONEAL BIOPSIES;  Surgeon: [name], MD;  Location: [place];  Service: General;  Laterality: N/A;  GENERAL AND EPIDURAL  . LIVER BIOPSY  08/2018   Dr. Lindwood Coke  . MASTECTOMY  1992   right, with flap  . NM MYOCAR PERF WALL MOTION  06/11/2009   Protocol:[place], post stress EF58%, EKG negative for ischemia, low risk  . PORTACATH PLACEMENT N/A 12/02/2018   Procedure: INSERTION PORT-A-CATH;  Surgeon: [name], MD;  Location: [place];  Service: General;  Laterality: N/A;  . RECONSTRUCTION BREAST W/ TRAM FLAP Right   . TONSILLECTOMY  1958  . TRANSTHORACIC ECHOCARDIOGRAM  12/24/2009   LVEF =>55%, normal study  . UPPER GI ENDOSCOPY      I have reviewed the social history and family history with the patient and they are unchanged from previous note.  ALLERGIES:  has No Known Allergies.  MEDICATIONS:   Current Outpatient Medications  Medication Sig Dispense Refill  . lidocaine-prilocaine (EMLA) cream Apply 1 application topically as needed. 30 g 1  . amLODipine (NORVASC) 10 MG tablet Take 1 tablet (10 mg total) by mouth daily. 90 tablet 3  . aspirin 81 MG tablet Take 81 mg by mouth daily.     . carvedilol (COREG) 25 MG tablet Take 1 tablet (25 mg total) by mouth 2 (two) times daily with a meal. 180 tablet 3  . furosemide (LASIX) 40 MG tablet Take 1 tablet (40 mg total) by mouth daily. 30 tablet 11  . gabapentin (NEURONTIN) 100 MG capsule Take 1 capsule (100 mg total) by mouth every 8 (eight) hours as needed. 180 capsule 2  . glucose blood test strip 1 each by Other route 2 (two) times daily. Use Onetouch verio test strips as instructed to check blood sugar twice daily. 100 each 2  . hydrALAZINE (APRESOLINE) 100 MG tablet TAKE THREE TIMES DAILY-AM, MIDDLE OF THE DAY AND IN THE EVENING. 90 tablet 3  . KLOR-CON M10 10 MEQ tablet TAKE 1 TABLET BY MOUTH EVERY DAY 90 tablet 1  . lansoprazole (PREVACID) 15 MG capsule Take 1 capsule (15 mg total) by mouth daily. 90 capsule 1  . loratadine (CLARITIN) 10 MG tablet Take 10 mg by mouth daily as needed for allergies.    . Omega 3 1000 MG CAPS Take 2,000 mg by mouth daily.     . rosuvastatin (CRESTOR) 20 MG tablet Take 1 tablet (20 mg total) by mouth daily. 90 tablet 3  . sitaGLIPtin (JANUVIA) 100 MG tablet Take  1 tablet (100 mg total) by mouth daily. 90 tablet 3  . valsartan (DIOVAN) 320 MG tablet Take 1 tablet (320 mg total) by mouth daily. 90 tablet 3   Current Facility-Administered Medications  Medication Dose Route Frequency Provider Last Rate Last Admin  . triamcinolone acetonide (KENALOG) 10 MG/ML injection 10 mg  10 mg Other Once Harriet Masson, DPM       Facility-Administered Medications Ordered in Other Visits  Medication Dose Route Frequency Provider Last Rate Last Admin  . gemcitabine (GEMZAR) 988 mg in sodium chloride 0.9 % 100 mL chemo  infusion  600 mg/m2 (Treatment Plan Recorded) Intravenous Once [name], MD 252 mL/hr at 08/21/20 1106 988 mg at 08/21/20 1106  . heparin lock flush 100 unit/mL  500 Units Intracatheter Once PRN [name], MD      . sodium chloride flush (NS) 0.9 % injection 10 mL  10 mL Intracatheter PRN [name], MD        PHYSICAL EXAMINATION: ECOG PERFORMANCE STATUS: 2 - Symptomatic, <50% confined to bed  Vitals with BMI 08/21/2020  Height 5'5"  Weight   BMI   Systolic 709  Diastolic 61  Pulse 52    GENERAL:alert, no distress and comfortable SKIN: skin color, texture, turgor are normal, no rashes or significant lesions EYES: normal, Conjunctiva are pink and non-injected, sclera clear  NECK: supple, thyroid normal size, non-tender, without nodularity LYMPH:  no palpable lymphadenopathy in the cervical, axillary  LUNGS: clear to auscultation and percussion with normal breathing effort HEART: regular rate & rhythm and no murmurs and no lower extremity edema ABDOMEN:abdomen soft, non-tender and normal bowel sounds Musculoskeletal:no cyanosis of digits and no clubbing  NEURO: alert & oriented x 3 with fluent speech, no focal motor/sensory deficits EXAM PERFORMED IN CHAIR TODAY   LABORATORY DATA:  I have reviewed the data as listed CBC Latest Ref Rng & Units 08/21/2020 08/08/2020 07/24/2020  WBC 4.0 - 10.5 K/uL 5.1 5.0 5.1  Hemoglobin 12.0 - 15.0 g/dL 9.0(L) 9.3(L) 8.9(L)  Hematocrit 36.0 - 46.0 % 27.7(L) 29.0(L) 28.1(L)  Platelets 150 - 400 K/uL 151 197 138(L)     CMP Latest Ref Rng & Units 08/21/2020 08/08/2020 07/24/2020  Glucose 70 - 99 mg/dL 82 86 91  BUN 8 - 23 mg/dL 29(H) 35(H) 40(H)  Creatinine 0.44 - 1.00 mg/dL 1.60(H) 1.41(H) 1.48(H)  Sodium 135 - 145 mmol/L 143 145 143  Potassium 3.5 - 5.1 mmol/L 4.3 4.7 4.6  Chloride 98 - 111 mmol/L 109 112(H) 110  CO2 22 - 32 mmol/L $RemoveB'24 26 25  'NvRHAflw$ Calcium 8.9 - 10.3 mg/dL 9.0 9.0 9.0  Total Protein 6.5 - 8.1 g/dL 6.9 6.9 6.8  Total Bilirubin 0.3 - 1.2  mg/dL 0.2(L) 0.2(L) 0.2(L)  Alkaline Phos 38 - 126 U/L 57 55 50  AST 15 - 41 U/L $Remo'18 18 20  'ETsrr$ ALT 0 - 44 U/L $Remo'9 10 9      'kPizB$ RADIOGRAPHIC STUDIES: I have personally reviewed the radiological images as listed and agreed with the findings in the report. No results found.   ASSESSMENT & PLAN:  JUDIE HOLLICK is a 80 y.o. female with    1.Intrahepatic cholangiocarcinoma, cT1N0M1,with peritoneal metastasis, MSS, IDH1 mutation (+) -She was diagnosed in 08/2018. Her biopsy of her liver mass shows adenocarcinoma,most consistent withcholangiocarcinoma.Her EGD/Colonoscopy from 7/10/20was negativeand 09/29/18 PET scanshowedno evidence of distant metastasis. -She was brought to OR on 11/17/18,unfortunately she was found to have peritoneal metastasis to right diaphragm and surgery was aborted. -Given  her metastatic cancer, Istarted her onsystemicchemo withFirst line chemoOxaliplatinand gemcitabine q2weeksto control her diseasebeginningon 11/30/18.Due to poor tolerance to Oxaliplatin and Cisplatin from neuropathy, she proceeded withsingleagent Gemcitabineevery 2 weeksstarting 06/28/19 with C16.Shewas onchemo break since10/2021-03/2020. -GivenPET from 03/14/20 showedconcern for new liver lesion, I restarted her onmaintenance Gemcitabine q2 weeksbeginning1/19/22.Her 06/11/20 PET showed stable disease.  -She continues to tolerate Gemcitabine well. Labs reviewed and stable. Overall adequate to proceed with Gemcitabine today at same dose. -Given she is very stable on treatment, plan for scan in early early  July. She is agreeable.  -Continue to f/u with her every 4 weeks.    2. Anemia,Secondary to chemoand cancer -Shepreviouslyrequiredblood transfusionsince 03/2019, continue as needed. Will continue prenatal vitamin. -Lastblood transfusion10/25/21. -Hg has been stable in 9 rangeoff chemo. With restart of chemo, Hg has dropped to [place].If she is symptomatic or labs drop below  8, will give blood transfusion. -Stable and moderate recently.  3.Peripheral neuropathy, G3, Secondary to Oxaliplatinand Cisplatin (both d/c) -Due to low tolerance and not much help, she stopped Lyrica,Gabapentinand Cymbalta. -She waspreviouslyseen by Dr Mickeal Skinner.She is now being seen byChiropractor Dr Page Spiro. -She remains to have decreased function overall.Stablewith occasional flares. -ContinueGabapentin 100mg  to use at night as needed. -She reached out to [facility] and was offered mostly physical shock treatment, light treatment and exercise. She is open to this although not covered by insurance and cost $6000 for 1 year. I recommend she pay for 3 months at a time given she is on active cancer treatment and changes may need to be made.  -She notes recent worsened Neuropathy in her hands recently. She has not proceeded with treatment from [facility]. I discussed option of neuropathy clinical trail (ACCRU [id number]) which is a phase 2 randomized trial of PEA ir placebo for chemo induced neuropathy. I discussed she would have to stop Gabapentin. She is in interested, I will refer her. I spoke with research nurse today   4. H/o right breast cancer, Geneticsnegative, Osteopenia -s/prightmastectomy in 1992, per patient did not require adjuvant therapy. She was last seen by Dr. Jana Hakim in 2017. -Her4/2019 DEXA shows mild osteopenia. F/u with Gyn about continuing Raloxifene. I do not think she has to continue at this point.  5.Comorbidities:DM,HTN,hiatal hernia, GERD, renal artery stenosis,2021pulmonary edema -Continue medications and f/u with PCP and cardiologist for management. -She is on torsemide twice a week, and hydrochlorothiazide daily  6. Family support, Goal of Care Discussion -She has a son and daughterwho are often busy but help as needed. -The patient understands the goal of care is palliative.She is full  code now  7.CKD stage III -Continue to monitor.Stable Cr at 1.2-15.   PLAN: -I refilled EMLA cream -Refer to Neuropathy clinica trail, I spoke with research nurse today  -Labs reviewed and adequate to proceed with Gemcitabine todayat same dose. -Lab, Flush, and Gemcitabine in 2, 4 weeks with 8am appointments.  -F/u in 4 weeks with PET scan a few days before.    No problem-specific Assessment & Plan notes found for this encounter.   Orders Placed This Encounter  Procedures  . NM PET Image Restag (PS) Skull Base To Thigh    Standing Status:   Future    Standing Expiration Date:   08/21/2021    Order Specific Question:   If indicated for the ordered procedure, I authorize the administration of a radiopharmaceutical per Radiology protocol    Answer:   Yes    Order Specific Question:   Preferred imaging  location?    Answer:   [facility]   All questions were answered. The patient knows to call the clinic with any problems, questions or concerns. No barriers to learning was detected. The total time spent in the appointment was 30 minutes.     [name], MD 08/21/2020   I, Joslyn Devon, am acting as scribe for [name], MD.   I have reviewed the above documentation for accuracy and completeness, and I agree with the above.

## 2020-08-19 ENCOUNTER — Other Ambulatory Visit: Payer: Self-pay

## 2020-08-19 DIAGNOSIS — C221 Intrahepatic bile duct carcinoma: Secondary | ICD-10-CM

## 2020-08-20 ENCOUNTER — Telehealth: Payer: Self-pay

## 2020-08-20 NOTE — Telephone Encounter (Signed)
1214 pm.  Phone call made to patient to schedule a home visit.  Patient states she is at [employer] and is unable to talk.  She is requesting a call back at a later time.  Palliative Care will outreach patient on a later date to schedule a home visit.

## 2020-08-21 ENCOUNTER — Inpatient Hospital Stay (HOSPITAL_BASED_OUTPATIENT_CLINIC_OR_DEPARTMENT_OTHER): Payer: Medicare Other | Admitting: Hematology

## 2020-08-21 ENCOUNTER — Encounter: Payer: Self-pay | Admitting: Emergency Medicine

## 2020-08-21 ENCOUNTER — Inpatient Hospital Stay: Payer: Medicare Other | Attending: Adult Health

## 2020-08-21 ENCOUNTER — Encounter: Payer: Self-pay | Admitting: Hematology

## 2020-08-21 ENCOUNTER — Ambulatory Visit: Payer: Medicare Other | Admitting: Hematology

## 2020-08-21 ENCOUNTER — Other Ambulatory Visit: Payer: Medicare Other

## 2020-08-21 ENCOUNTER — Inpatient Hospital Stay: Payer: Medicare Other

## 2020-08-21 ENCOUNTER — Other Ambulatory Visit: Payer: Self-pay

## 2020-08-21 VITALS — BP 166/61 | HR 52 | Temp 98.4°F | Resp 18

## 2020-08-21 DIAGNOSIS — C786 Secondary malignant neoplasm of retroperitoneum and peritoneum: Secondary | ICD-10-CM | POA: Diagnosis not present

## 2020-08-21 DIAGNOSIS — C221 Intrahepatic bile duct carcinoma: Secondary | ICD-10-CM

## 2020-08-21 DIAGNOSIS — Z5111 Encounter for antineoplastic chemotherapy: Secondary | ICD-10-CM | POA: Diagnosis not present

## 2020-08-21 DIAGNOSIS — G620 Drug-induced polyneuropathy: Secondary | ICD-10-CM

## 2020-08-21 DIAGNOSIS — D6481 Anemia due to antineoplastic chemotherapy: Secondary | ICD-10-CM | POA: Insufficient documentation

## 2020-08-21 DIAGNOSIS — D649 Anemia, unspecified: Secondary | ICD-10-CM

## 2020-08-21 DIAGNOSIS — T451X5A Adverse effect of antineoplastic and immunosuppressive drugs, initial encounter: Secondary | ICD-10-CM | POA: Diagnosis not present

## 2020-08-21 DIAGNOSIS — Z95828 Presence of other vascular implants and grafts: Secondary | ICD-10-CM

## 2020-08-21 LAB — CBC WITH DIFFERENTIAL (CANCER CENTER ONLY)
Abs Immature Granulocytes: 0.01 10*3/uL (ref 0.00–0.07)
Basophils Absolute: 0 10*3/uL (ref 0.0–0.1)
Basophils Relative: 1 %
Eosinophils Absolute: 0.2 10*3/uL (ref 0.0–0.5)
Eosinophils Relative: 3 %
HCT: 27.7 % — ABNORMAL LOW (ref 36.0–46.0)
Hemoglobin: 9 g/dL — ABNORMAL LOW (ref 12.0–15.0)
Immature Granulocytes: 0 %
Lymphocytes Relative: 28 %
Lymphs Abs: 1.4 10*3/uL (ref 0.7–4.0)
MCH: 33 pg (ref 26.0–34.0)
MCHC: 32.5 g/dL (ref 30.0–36.0)
MCV: 101.5 fL — ABNORMAL HIGH (ref 80.0–100.0)
Monocytes Absolute: 1.1 10*3/uL — ABNORMAL HIGH (ref 0.1–1.0)
Monocytes Relative: 21 %
Neutro Abs: 2.4 10*3/uL (ref 1.7–7.7)
Neutrophils Relative %: 47 %
Platelet Count: 151 10*3/uL (ref 150–400)
RBC: 2.73 MIL/uL — ABNORMAL LOW (ref 3.87–5.11)
RDW: 15.6 % — ABNORMAL HIGH (ref 11.5–15.5)
WBC Count: 5.1 10*3/uL (ref 4.0–10.5)
nRBC: 0 % (ref 0.0–0.2)

## 2020-08-21 LAB — CMP (CANCER CENTER ONLY)
ALT: 9 U/L (ref 0–44)
AST: 18 U/L (ref 15–41)
Albumin: 3.5 g/dL (ref 3.5–5.0)
Alkaline Phosphatase: 57 U/L (ref 38–126)
Anion gap: 10 (ref 5–15)
BUN: 29 mg/dL — ABNORMAL HIGH (ref 8–23)
CO2: 24 mmol/L (ref 22–32)
Calcium: 9 mg/dL (ref 8.9–10.3)
Chloride: 109 mmol/L (ref 98–111)
Creatinine: 1.6 mg/dL — ABNORMAL HIGH (ref 0.44–1.00)
GFR, Estimated: 32 mL/min — ABNORMAL LOW (ref >60)
Glucose, Bld: 82 mg/dL (ref 70–99)
Potassium: 4.3 mmol/L (ref 3.5–5.1)
Sodium: 143 mmol/L (ref 135–145)
Total Bilirubin: 0.2 mg/dL — ABNORMAL LOW (ref 0.3–1.2)
Total Protein: 6.9 g/dL (ref 6.5–8.1)

## 2020-08-21 LAB — SAMPLE TO BLOOD BANK

## 2020-08-21 MED ORDER — SODIUM CHLORIDE 0.9% FLUSH
10.0000 mL | Freq: Once | INTRAVENOUS | Status: AC
  Administered 2020-08-21: 10 mL
  Filled 2020-08-21: qty 10

## 2020-08-21 MED ORDER — SODIUM CHLORIDE 0.9% FLUSH
10.0000 mL | INTRAVENOUS | Status: DC | PRN
  Administered 2020-08-21: 10 mL
  Filled 2020-08-21: qty 10

## 2020-08-21 MED ORDER — SODIUM CHLORIDE 0.9 % IV SOLN
600.0000 mg/m2 | Freq: Once | INTRAVENOUS | Status: AC
  Administered 2020-08-21: 988 mg via INTRAVENOUS
  Filled 2020-08-21: qty 25.98

## 2020-08-21 MED ORDER — LIDOCAINE-PRILOCAINE 2.5-2.5 % EX CREA: 1.0000 "application " | TOPICAL_CREAM | CUTANEOUS | 1 refills | Status: AC | PRN

## 2020-08-21 MED ORDER — PROCHLORPERAZINE MALEATE 10 MG PO TABS
ORAL_TABLET | ORAL | Status: AC
  Filled 2020-08-21: qty 1

## 2020-08-21 MED ORDER — SODIUM CHLORIDE 0.9 % IV SOLN
Freq: Once | INTRAVENOUS | Status: AC
Start: 2020-08-21 — End: 2020-08-21
  Filled 2020-08-21: qty 250

## 2020-08-21 MED ORDER — HEPARIN SOD (PORK) LOCK FLUSH 100 UNIT/ML IV SOLN
500.0000 [IU] | Freq: Once | INTRAVENOUS | Status: AC | PRN
  Administered 2020-08-21: 500 [IU]
  Filled 2020-08-21: qty 5

## 2020-08-21 MED ORDER — PROCHLORPERAZINE MALEATE 10 MG PO TABS
10.0000 mg | ORAL_TABLET | Freq: Once | ORAL | Status: AC
  Administered 2020-08-21: 10 mg via ORAL

## 2020-08-21 NOTE — Progress Notes (Signed)
Creatnine today is 1.6, ok to treat per Lanora Manis NP

## 2020-08-21 NOTE — Patient Instructions (Signed)
[facility]ICAL [facility]COL[facility]  Discharge Instructions: Thank you for choosing [facility] to provide your oncology and hematology care.   If you have a lab appointment with the Cancer Center, please go directly to the Cancer Center and check in at the registration area.   Wear comfortable clothing and clothing appropriate for easy access to any Portacath or PICC line.   We strive to give you quality time with your provider. You may need to reschedule your appointment if you arrive late (15 or more minutes).  Arriving late affects you and other patients whose appointments are after yours.  Also, if you miss three or more appointments without notifying the office, you may be dismissed from the clinic at the provider's discretion.      For prescription refill requests, have your pharmacy contact our office and allow 72 hours for refills to be completed.    Today you received the following chemotherapy and/or immunotherapy agents Gemzar     To help prevent nausea and vomiting after your treatment, we encourage you to take your nausea medication as directed.  BELOW ARE SYMPTOMS THAT SHOULD BE REPORTED IMMEDIATELY: . *FEVER GREATER THAN 100.4 F (38 C) OR HIGHER . *CHILLS OR SWEATING . *NAUSEA AND VOMITING THAT IS NOT CONTROLLED WITH YOUR NAUSEA MEDICATION . *UNUSUAL SHORTNESS OF BREATH . *UNUSUAL BRUISING OR BLEEDING . *URINARY PROBLEMS (pain or burning when urinating, or frequent urination) . *BOWEL PROBLEMS (unusual diarrhea, constipation, pain near the anus) . TENDERNESS IN MOUTH AND THROAT WITH OR WITHOUT PRESENCE OF ULCERS (sore throat, sores in mouth, or a toothache) . UNUSUAL RASH, SWELLING OR PAIN  . UNUSUAL VAGINAL DISCHARGE OR ITCHING   Items with * indicate a potential emergency and should be followed up as soon as possible or go to the Emergency Department if any problems should occur.  Please show the CHEMOTHERAPY ALERT CARD or IMMUNOTHERAPY ALERT CARD  at check-in to the Emergency Department and triage nurse.  Should you have questions after your visit or need to cancel or reschedule your appointment, please contact [facility]  Dept: [phone]  and follow the prompts.  Office hours are 8:00 a.m. to 4:30 p.m. Monday - Friday. Please note that voicemails left after 4:00 p.m. may not be returned until the following business day.  We are closed weekends and major holidays. You have access to a nurse at all times for urgent questions. Please call the main number to the clinic Dept: [phone] and follow the prompts.   For any non-urgent questions, you may also contact your provider using MyChart. We now offer e-Visits for anyone 18 and older to request care online for non-urgent symptoms. For details visit [email or web address].   Also download the MyChart app! Go to the app store, search "MyChart", open the app, select [facility], and log in with your MyChart username and password.  Due to Covid, a mask is required upon entering the hospital/clinic. If you do not have a mask, one will be given to you upon arrival. For doctor visits, patients may have 1 support person aged 18 or older with them. For treatment visits, patients cannot have anyone with them due to current Covid guidelines and our immunocompromised population.   Gemcitabine injection What is this medicine? GEMCITABINE (jem SYE ta been) is a chemotherapy drug. This medicine is used to treat many types of cancer like breast cancer, lung cancer, pancreatic cancer, and ovarian cancer. This medicine may be used   for other purposes; ask your health care provider or pharmacist if you have questions. COMMON BRAND NAME(S): Gemzar, Infugem What should I tell my health care provider before I take this medicine? They need to know if you have any of these conditions:  blood disorders  infection  kidney disease  liver disease  lung or breathing disease,  like asthma  recent or ongoing radiation therapy  an unusual or allergic reaction to gemcitabine, other chemotherapy, other medicines, foods, dyes, or preservatives  pregnant or trying to get pregnant  breast-feeding How should I use this medicine? This drug is given as an infusion into a vein. It is administered in a hospital or clinic by a specially trained health care professional. Talk to your pediatrician regarding the use of this medicine in children. Special care may be needed. Overdosage: If you think you have taken too much of this medicine contact a poison control center or emergency room at once. NOTE: This medicine is only for you. Do not share this medicine with others. What if I miss a dose? It is important not to miss your dose. Call your doctor or health care professional if you are unable to keep an appointment. What may interact with this medicine?  medicines to increase blood counts like filgrastim, pegfilgrastim, sargramostim  some other chemotherapy drugs like cisplatin  vaccines Talk to your doctor or health care professional before taking any of these medicines:  acetaminophen  aspirin  ibuprofen  ketoprofen  naproxen This list may not describe all possible interactions. Give your health care provider a list of all the medicines, herbs, non-prescription drugs, or dietary supplements you use. Also tell them if you smoke, drink alcohol, or use illegal drugs. Some items may interact with your medicine. What should I watch for while using this medicine? Visit your doctor for checks on your progress. This drug may make you feel generally unwell. This is not uncommon, as chemotherapy can affect healthy cells as well as cancer cells. Report any side effects. Continue your course of treatment even though you feel ill unless your doctor tells you to stop. In some cases, you may be given additional medicines to help with side effects. Follow all directions for their  use. Call your doctor or health care professional for advice if you get a fever, chills or sore throat, or other symptoms of a cold or flu. Do not treat yourself. This drug decreases your body's ability to fight infections. Try to avoid being around people who are sick. This medicine may increase your risk to bruise or bleed. Call your doctor or health care professional if you notice any unusual bleeding. Be careful brushing and flossing your teeth or using a toothpick because you may get an infection or bleed more easily. If you have any dental work done, tell your dentist you are receiving this medicine. Avoid taking products that contain aspirin, acetaminophen, ibuprofen, naproxen, or ketoprofen unless instructed by your doctor. These medicines may hide a fever. Do not become pregnant while taking this medicine or for 6 months after stopping it. Women should inform their doctor if they wish to become pregnant or think they might be pregnant. Men should not father a child while taking this medicine and for 3 months after stopping it. There is a potential for serious side effects to an unborn child. Talk to your health care professional or pharmacist for more information. Do not breast-feed an infant while taking this medicine or for at least 1 week after   stopping it. Men should inform their doctors if they wish to father a child. This medicine may lower sperm counts. Talk with your doctor or health care professional if you are concerned about your fertility. What side effects may I notice from receiving this medicine? Side effects that you should report to your doctor or health care professional as soon as possible:  allergic reactions like skin rash, itching or hives, swelling of the face, lips, or tongue  breathing problems  pain, redness, or irritation at site where injected  signs and symptoms of a dangerous change in heartbeat or heart rhythm like chest pain; dizziness; fast or irregular  heartbeat; palpitations; feeling faint or lightheaded, falls; breathing problems  signs of decreased platelets or bleeding - bruising, pinpoint red spots on the skin, black, tarry stools, blood in the urine  signs of decreased red blood cells - unusually weak or tired, feeling faint or lightheaded, falls  signs of infection - fever or chills, cough, sore throat, pain or difficulty passing urine  signs and symptoms of kidney injury like trouble passing urine or change in the amount of urine  signs and symptoms of liver injury like dark yellow or brown urine; general ill feeling or flu-like symptoms; light-colored stools; loss of appetite; nausea; right upper belly pain; unusually weak or tired; yellowing of the eyes or skin  swelling of ankles, feet, hands Side effects that usually do not require medical attention (report to your doctor or health care professional if they continue or are bothersome):  constipation  diarrhea  hair loss  loss of appetite  nausea  rash  vomiting This list may not describe all possible side effects. Call your doctor for medical advice about side effects. You may report side effects to FDA at 1-800-FDA-1088. Where should I keep my medicine? This drug is given in a hospital or clinic and will not be stored at home. NOTE: This sheet is a summary. It may not cover all possible information. If you have questions about this medicine, talk to your doctor, pharmacist, or health care provider.  2021 Elsevier/Gold Standard (2017-05-26 18:06:11)   

## 2020-08-21 NOTE — Research (Signed)
ACCRU-[place]-2102 - TREATMENT OF ESTABLISHED CHEMOTHERAPY-INDUCED NEUROPATHY WITH N-PALMITOYLETHANOLAMIDE, A CANNABIMIMETIC NUTRACEUTICAL: A RANDOMIZED DOUBLE-BLIND PHASE II PILOT TRIAL  This patient was referred by Dr. Burr Medico for this research study.  Patient was found to be ineligible for the study at this time as she is currently receiving chemotherapy.  Advised patient that she was not eligible at this time.  Clabe Seal Clinical Research Coordinator I  08/21/20 11:51 AM

## 2020-08-23 ENCOUNTER — Telehealth: Payer: Self-pay

## 2020-08-23 NOTE — Telephone Encounter (Signed)
1130 am.  Phone call made to patient to schedule a home visit and follow up on diet/recipe request.  Call goes to VM.  Message left for patient requesting a call back.    Response pending.

## 2020-08-26 ENCOUNTER — Telehealth: Payer: Self-pay | Admitting: Hematology

## 2020-08-26 NOTE — Telephone Encounter (Signed)
Left message with follow-up appointments per 6/8 los. Gave option to call back to reschedule if needed.

## 2020-08-27 ENCOUNTER — Telehealth: Payer: Self-pay | Admitting: Gastroenterology

## 2020-08-27 MED ORDER — LANSOPRAZOLE 15 MG PO CPDR: 15.0000 mg | DELAYED_RELEASE_CAPSULE | Freq: Every day | ORAL | 0 refills | Status: DC

## 2020-08-27 NOTE — Telephone Encounter (Signed)
Inbound call from patient. Needs refill for Lansoprazole 15mg  cap. [facility] [address] in [place]. Patient have an appt 11/01/20 with Dr. Havery Moros. She states she also knows she need prior authorization.

## 2020-08-27 NOTE — Telephone Encounter (Signed)
[facility]  

## 2020-08-30 MED ORDER — LANSOPRAZOLE 15 MG PO CPDR: 15.0000 mg | DELAYED_RELEASE_CAPSULE | Freq: Every day | ORAL | 0 refills | Status: DC

## 2020-08-30 NOTE — Telephone Encounter (Signed)
Initiated PA through Cover My Meds.  Awaiting reply

## 2020-08-30 NOTE — Telephone Encounter (Signed)
Inbound call from patient. States the prescription for Lansoprazole needs a prior [place]

## 2020-08-30 NOTE — Telephone Encounter (Signed)
PA for Prevacid approved - resent to pharmacy with the approval information.

## 2020-08-30 NOTE — Addendum Note (Signed)
Addended by: Audrea Muscat on: 08/30/2020 04:04 PM   Modules accepted: Orders

## 2020-09-02 ENCOUNTER — Other Ambulatory Visit: Payer: Medicare Other

## 2020-09-02 ENCOUNTER — Other Ambulatory Visit: Payer: Self-pay

## 2020-09-02 DIAGNOSIS — Z515 Encounter for palliative care: Secondary | ICD-10-CM

## 2020-09-02 NOTE — Progress Notes (Signed)
PATIENT NAME: Donna May DOB: [birth year] MRN: [id number]  PRIMARY CARE PROVIDER: [name], MD  RESPONSIBLE PARTY:  Acct ID - Guarantor Home Phone Work Phone Relationship Acct Type  [id number] LILYTH, [phone]  Self P/F     [address], [place], [place] [id number]   TELEHEALTH VISIT STATEMENT Due to the COVID-19 crisis, this visit was done via telemedicine from my office and it was initiated and consent by this patient and or family.   PLAN OF CARE and INTERVENTIONS:               1.  GOALS OF CARE/ ADVANCE CARE PLANNING: Remain home and independent for as long as possible.               2.  PATIENT/CAREGIVER EDUCATION:  CHF and diet.                4. PERSONAL EMERGENCY PLAN:  Activate 911 for emergencies.               5.  DISEASE STATUS:  Visit was originally scheduled for a home visit.  Patient called and asked if this could be re-scheduled as she is having some issues with her dishwasher and refrigerator.  She is currently working on getting this items fixed.  Patient is able to provide an update on her status.  She continues treatment for intrahepatic cholangiocarcinoma.  Only side effect she is noting is the worsening neuropathy to her hands and feet.  Patient has been seen regarding treatment options for neuropathy but has deferred options at this time.  Patient states she is eating very well.  She continues to weigh herself daily and her current weight is 127 lbs.  She denies shortness of breath and no edema is present to her lower extremities.   On my last visit, patient and I discussed a low sodium diet an monitoring weights due to CHF diagnosis.  I have printed teaching sheets for patient and she has requested I mail this to her.  We will review these on my next visit.   Patient advised that she would contact me to schedule our next visit once above items have been addressed.   HISTORY OF PRESENT ILLNESS:  80 year old female with Intrahepatic  Cholangiocarcinoma and CHF.    Patient is being followed by Palliative Care monthly and PRN.  CODE STATUS: DNR ADVANCED DIRECTIVES: Yes MOST FORM: Yes PPS: 60%        Lorenza Burton, RN

## 2020-09-04 ENCOUNTER — Inpatient Hospital Stay: Payer: Medicare Other

## 2020-09-04 ENCOUNTER — Other Ambulatory Visit: Payer: Self-pay

## 2020-09-04 VITALS — BP 145/66 | HR 60 | Temp 98.4°F | Resp 18

## 2020-09-04 DIAGNOSIS — D649 Anemia, unspecified: Secondary | ICD-10-CM

## 2020-09-04 DIAGNOSIS — D6481 Anemia due to antineoplastic chemotherapy: Secondary | ICD-10-CM | POA: Diagnosis not present

## 2020-09-04 DIAGNOSIS — Z5111 Encounter for antineoplastic chemotherapy: Secondary | ICD-10-CM | POA: Diagnosis not present

## 2020-09-04 DIAGNOSIS — C221 Intrahepatic bile duct carcinoma: Secondary | ICD-10-CM

## 2020-09-04 DIAGNOSIS — Z95828 Presence of other vascular implants and grafts: Secondary | ICD-10-CM

## 2020-09-04 DIAGNOSIS — C786 Secondary malignant neoplasm of retroperitoneum and peritoneum: Secondary | ICD-10-CM | POA: Diagnosis not present

## 2020-09-04 LAB — CMP (CANCER CENTER ONLY)
ALT: 10 U/L (ref 0–44)
AST: 21 U/L (ref 15–41)
Albumin: 3.5 g/dL (ref 3.5–5.0)
Alkaline Phosphatase: 56 U/L (ref 38–126)
Anion gap: 8 (ref 5–15)
BUN: 36 mg/dL — ABNORMAL HIGH (ref 8–23)
CO2: 24 mmol/L (ref 22–32)
Calcium: 9.4 mg/dL (ref 8.9–10.3)
Chloride: 110 mmol/L (ref 98–111)
Creatinine: 1.7 mg/dL — ABNORMAL HIGH (ref 0.44–1.00)
GFR, Estimated: 30 mL/min — ABNORMAL LOW (ref >60)
Glucose, Bld: 87 mg/dL (ref 70–99)
Potassium: 4.6 mmol/L (ref 3.5–5.1)
Sodium: 142 mmol/L (ref 135–145)
Total Bilirubin: 0.3 mg/dL (ref 0.3–1.2)
Total Protein: 7.1 g/dL (ref 6.5–8.1)

## 2020-09-04 LAB — CBC WITH DIFFERENTIAL (CANCER CENTER ONLY)
Abs Immature Granulocytes: 0.02 10*3/uL (ref 0.00–0.07)
Basophils Absolute: 0 10*3/uL (ref 0.0–0.1)
Basophils Relative: 0 %
Eosinophils Absolute: 0.2 10*3/uL (ref 0.0–0.5)
Eosinophils Relative: 4 %
HCT: 29.4 % — ABNORMAL LOW (ref 36.0–46.0)
Hemoglobin: 9.6 g/dL — ABNORMAL LOW (ref 12.0–15.0)
Immature Granulocytes: 0 %
Lymphocytes Relative: 20 %
Lymphs Abs: 1.2 10*3/uL (ref 0.7–4.0)
MCH: 32.8 pg (ref 26.0–34.0)
MCHC: 32.7 g/dL (ref 30.0–36.0)
MCV: 100.3 fL — ABNORMAL HIGH (ref 80.0–100.0)
Monocytes Absolute: 1 10*3/uL (ref 0.1–1.0)
Monocytes Relative: 16 %
Neutro Abs: 3.4 10*3/uL (ref 1.7–7.7)
Neutrophils Relative %: 60 %
Platelet Count: 191 10*3/uL (ref 150–400)
RBC: 2.93 MIL/uL — ABNORMAL LOW (ref 3.87–5.11)
RDW: 15.9 % — ABNORMAL HIGH (ref 11.5–15.5)
WBC Count: 5.8 10*3/uL (ref 4.0–10.5)
nRBC: 0 % (ref 0.0–0.2)

## 2020-09-04 LAB — SAMPLE TO BLOOD BANK

## 2020-09-04 MED ORDER — SODIUM CHLORIDE 0.9% FLUSH
10.0000 mL | Freq: Once | INTRAVENOUS | Status: AC
  Administered 2020-09-04: 10 mL
  Filled 2020-09-04: qty 10

## 2020-09-04 MED ORDER — SODIUM CHLORIDE 0.9 % IV SOLN
Freq: Once | INTRAVENOUS | Status: AC
Start: 2020-09-04 — End: 2020-09-04
  Filled 2020-09-04: qty 250

## 2020-09-04 MED ORDER — SODIUM CHLORIDE 0.9 % IV SOLN
600.0000 mg/m2 | Freq: Once | INTRAVENOUS | Status: AC
  Administered 2020-09-04: 988 mg via INTRAVENOUS
  Filled 2020-09-04: qty 25.98

## 2020-09-04 MED ORDER — SODIUM CHLORIDE 0.9% FLUSH
10.0000 mL | INTRAVENOUS | Status: DC | PRN
  Administered 2020-09-04: 10 mL
  Filled 2020-09-04: qty 10

## 2020-09-04 MED ORDER — PROCHLORPERAZINE MALEATE 10 MG PO TABS
ORAL_TABLET | ORAL | Status: AC
  Filled 2020-09-04: qty 1

## 2020-09-04 MED ORDER — HEPARIN SOD (PORK) LOCK FLUSH 100 UNIT/ML IV SOLN
500.0000 [IU] | Freq: Once | INTRAVENOUS | Status: AC | PRN
Start: 2020-09-04 — End: 2020-09-04
  Administered 2020-09-04: 500 [IU]
  Filled 2020-09-04: qty 5

## 2020-09-04 MED ORDER — PROCHLORPERAZINE MALEATE 10 MG PO TABS
10.0000 mg | ORAL_TABLET | Freq: Once | ORAL | Status: AC
  Administered 2020-09-04: 10 mg via ORAL

## 2020-09-04 NOTE — Progress Notes (Signed)
Per Dr. Burr Medico OK to treat with creatinine 1.70

## 2020-09-04 NOTE — Patient Instructions (Signed)
[facility]  Discharge Instructions: Thank you for choosing [facility] to provide your oncology and hematology care.   If you have a lab appointment with the Cancer Center, please go directly to the Cancer Center and check in at the registration area.   Wear comfortable clothing and clothing appropriate for easy access to any Portacath or PICC line.   We strive to give you quality time with your provider. You may need to reschedule your appointment if you arrive late (15 or more minutes).  Arriving late affects you and other patients whose appointments are after yours.  Also, if you miss three or more appointments without notifying the office, you may be dismissed from the clinic at the provider's discretion.      For prescription refill requests, have your pharmacy contact our office and allow 72 hours for refills to be completed.    Today you received the following chemotherapy and/or immunotherapy agents Gemzar     To help prevent nausea and vomiting after your treatment, we encourage you to take your nausea medication as directed.  BELOW ARE SYMPTOMS THAT SHOULD BE REPORTED IMMEDIATELY: . *FEVER GREATER THAN 100.4 F (38 C) OR HIGHER . *CHILLS OR SWEATING . *NAUSEA AND VOMITING THAT IS NOT CONTROLLED WITH YOUR NAUSEA MEDICATION . *UNUSUAL SHORTNESS OF BREATH . *UNUSUAL BRUISING OR BLEEDING . *URINARY PROBLEMS (pain or burning when urinating, or frequent urination) . *BOWEL PROBLEMS (unusual diarrhea, constipation, pain near the anus) . TENDERNESS IN MOUTH AND THROAT WITH OR WITHOUT PRESENCE OF ULCERS (sore throat, sores in mouth, or a toothache) . UNUSUAL RASH, SWELLING OR PAIN  . UNUSUAL VAGINAL DISCHARGE OR ITCHING   Items with * indicate a potential emergency and should be followed up as soon as possible or go to the Emergency Department if any problems should occur.  Please show the CHEMOTHERAPY ALERT CARD or IMMUNOTHERAPY ALERT CARD  at check-in to the Emergency Department and triage nurse.  Should you have questions after your visit or need to cancel or reschedule your appointment, please contact [facility]  Dept: [phone]  and follow the prompts.  Office hours are 8:00 a.m. to 4:30 p.m. Monday - Friday. Please note that voicemails left after 4:00 p.m. may not be returned until the following business day.  We are closed weekends and major holidays. You have access to a nurse at all times for urgent questions. Please call the main number to the clinic Dept: [phone] and follow the prompts.   For any non-urgent questions, you may also contact your provider using MyChart. We now offer e-Visits for anyone 18 and older to request care online for non-urgent symptoms. For details visit [email or web address].   Also download the MyChart app! Go to the app store, search "MyChart", open the app, select [facility], and log in with your MyChart username and password.  Due to Covid, a mask is required upon entering the hospital/clinic. If you do not have a mask, one will be given to you upon arrival. For doctor visits, patients may have 1 support person aged 18 or older with them. For treatment visits, patients cannot have anyone with them due to current Covid guidelines and our immunocompromised population.   Gemcitabine injection What is this medicine? GEMCITABINE (jem SYE ta been) is a chemotherapy drug. This medicine is used to treat many types of cancer like breast cancer, lung cancer, pancreatic cancer, and ovarian cancer. This medicine may be used   for other purposes; ask your health care provider or pharmacist if you have questions. COMMON BRAND NAME(S): Gemzar, Infugem What should I tell my health care provider before I take this medicine? They need to know if you have any of these conditions:  blood disorders  infection  kidney disease  liver disease  lung or breathing disease,  like asthma  recent or ongoing radiation therapy  an unusual or allergic reaction to gemcitabine, other chemotherapy, other medicines, foods, dyes, or preservatives  pregnant or trying to get pregnant  breast-feeding How should I use this medicine? This drug is given as an infusion into a vein. It is administered in a hospital or clinic by a specially trained health care professional. Talk to your pediatrician regarding the use of this medicine in children. Special care may be needed. Overdosage: If you think you have taken too much of this medicine contact a poison control center or emergency room at once. NOTE: This medicine is only for you. Do not share this medicine with others. What if I miss a dose? It is important not to miss your dose. Call your doctor or health care professional if you are unable to keep an appointment. What may interact with this medicine?  medicines to increase blood counts like filgrastim, pegfilgrastim, sargramostim  some other chemotherapy drugs like cisplatin  vaccines Talk to your doctor or health care professional before taking any of these medicines:  acetaminophen  aspirin  ibuprofen  ketoprofen  naproxen This list may not describe all possible interactions. Give your health care provider a list of all the medicines, herbs, non-prescription drugs, or dietary supplements you use. Also tell them if you smoke, drink alcohol, or use illegal drugs. Some items may interact with your medicine. What should I watch for while using this medicine? Visit your doctor for checks on your progress. This drug may make you feel generally unwell. This is not uncommon, as chemotherapy can affect healthy cells as well as cancer cells. Report any side effects. Continue your course of treatment even though you feel ill unless your doctor tells you to stop. In some cases, you may be given additional medicines to help with side effects. Follow all directions for their  use. Call your doctor or health care professional for advice if you get a fever, chills or sore throat, or other symptoms of a cold or flu. Do not treat yourself. This drug decreases your body's ability to fight infections. Try to avoid being around people who are sick. This medicine may increase your risk to bruise or bleed. Call your doctor or health care professional if you notice any unusual bleeding. Be careful brushing and flossing your teeth or using a toothpick because you may get an infection or bleed more easily. If you have any dental work done, tell your dentist you are receiving this medicine. Avoid taking products that contain aspirin, acetaminophen, ibuprofen, naproxen, or ketoprofen unless instructed by your doctor. These medicines may hide a fever. Do not become pregnant while taking this medicine or for 6 months after stopping it. Women should inform their doctor if they wish to become pregnant or think they might be pregnant. Men should not father a child while taking this medicine and for 3 months after stopping it. There is a potential for serious side effects to an unborn child. Talk to your health care professional or pharmacist for more information. Do not breast-feed an infant while taking this medicine or for at least 1 week after   stopping it. Men should inform their doctors if they wish to father a child. This medicine may lower sperm counts. Talk with your doctor or health care professional if you are concerned about your fertility. What side effects may I notice from receiving this medicine? Side effects that you should report to your doctor or health care professional as soon as possible:  allergic reactions like skin rash, itching or hives, swelling of the face, lips, or tongue  breathing problems  pain, redness, or irritation at site where injected  signs and symptoms of a dangerous change in heartbeat or heart rhythm like chest pain; dizziness; fast or irregular  heartbeat; palpitations; feeling faint or lightheaded, falls; breathing problems  signs of decreased platelets or bleeding - bruising, pinpoint red spots on the skin, black, tarry stools, blood in the urine  signs of decreased red blood cells - unusually weak or tired, feeling faint or lightheaded, falls  signs of infection - fever or chills, cough, sore throat, pain or difficulty passing urine  signs and symptoms of kidney injury like trouble passing urine or change in the amount of urine  signs and symptoms of liver injury like dark yellow or brown urine; general ill feeling or flu-like symptoms; light-colored stools; loss of appetite; nausea; right upper belly pain; unusually weak or tired; yellowing of the eyes or skin  swelling of ankles, feet, hands Side effects that usually do not require medical attention (report to your doctor or health care professional if they continue or are bothersome):  constipation  diarrhea  hair loss  loss of appetite  nausea  rash  vomiting This list may not describe all possible side effects. Call your doctor for medical advice about side effects. You may report side effects to FDA at 1-800-FDA-1088. Where should I keep my medicine? This drug is given in a hospital or clinic and will not be stored at home. NOTE: This sheet is a summary. It may not cover all possible information. If you have questions about this medicine, talk to your doctor, pharmacist, or health care provider.  2021 Elsevier/Gold Standard (2017-05-26 18:06:11)   

## 2020-09-12 ENCOUNTER — Ambulatory Visit: Payer: Medicare Other | Admitting: Cardiovascular Disease

## 2020-09-13 ENCOUNTER — Other Ambulatory Visit: Payer: Self-pay

## 2020-09-13 ENCOUNTER — Telehealth: Payer: Self-pay | Admitting: Cardiovascular Disease

## 2020-09-13 MED ORDER — HYDRALAZINE HCL 100 MG PO TABS: ORAL_TABLET | ORAL | 3 refills | Status: AC

## 2020-09-13 NOTE — Telephone Encounter (Signed)
*  STAT* If patient is at the pharmacy, call can be transferred to refill team.   1. Which medications need to be refilled? (please list name of each medication and dose if known)  hydrALAZINE (APRESOLINE) 100 MG tablet 100 mg 3x daily   2. Which pharmacy/location (including street and city if local pharmacy) is medication to be sent to? [id number] [place], [place] - [address] AT [place]  3. Do they need a 30 day or 90 day supply? 90    Patient is out of medication

## 2020-09-17 ENCOUNTER — Ambulatory Visit (HOSPITAL_COMMUNITY)
Admission: RE | Admit: 2020-09-17 | Discharge: 2020-09-17 | Disposition: A | Discharge status: Home / Self Care | Payer: Medicare Other | Source: Ambulatory Visit | Attending: Hematology | Admitting: Hematology

## 2020-09-17 ENCOUNTER — Other Ambulatory Visit: Payer: Self-pay

## 2020-09-17 DIAGNOSIS — R160 Hepatomegaly, not elsewhere classified: Secondary | ICD-10-CM | POA: Diagnosis not present

## 2020-09-17 DIAGNOSIS — C221 Intrahepatic bile duct carcinoma: Secondary | ICD-10-CM | POA: Diagnosis not present

## 2020-09-17 LAB — GLUCOSE, CAPILLARY: Glucose-Capillary: 88 mg/dL (ref 70–99)
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
## 2020-09-18 ENCOUNTER — Inpatient Hospital Stay: Payer: Medicare Other | Attending: Adult Health

## 2020-09-18 ENCOUNTER — Inpatient Hospital Stay: Payer: Medicare Other

## 2020-09-18 ENCOUNTER — Other Ambulatory Visit: Payer: Self-pay

## 2020-09-18 ENCOUNTER — Inpatient Hospital Stay (HOSPITAL_BASED_OUTPATIENT_CLINIC_OR_DEPARTMENT_OTHER): Payer: Medicare Other | Admitting: Physician Assistant

## 2020-09-18 VITALS — BP 188/75 | HR 62 | Temp 98.2°F | Resp 17 | Wt 129.1 lb

## 2020-09-18 VITALS — BP 160/61

## 2020-09-18 DIAGNOSIS — C221 Intrahepatic bile duct carcinoma: Secondary | ICD-10-CM

## 2020-09-18 DIAGNOSIS — T451X5A Adverse effect of antineoplastic and immunosuppressive drugs, initial encounter: Secondary | ICD-10-CM | POA: Diagnosis not present

## 2020-09-18 DIAGNOSIS — D649 Anemia, unspecified: Secondary | ICD-10-CM

## 2020-09-18 DIAGNOSIS — Z5111 Encounter for antineoplastic chemotherapy: Secondary | ICD-10-CM | POA: Insufficient documentation

## 2020-09-18 DIAGNOSIS — D6481 Anemia due to antineoplastic chemotherapy: Secondary | ICD-10-CM | POA: Insufficient documentation

## 2020-09-18 DIAGNOSIS — G620 Drug-induced polyneuropathy: Secondary | ICD-10-CM

## 2020-09-18 DIAGNOSIS — N1832 Chronic kidney disease, stage 3b: Secondary | ICD-10-CM

## 2020-09-18 DIAGNOSIS — Z79899 Other long term (current) drug therapy: Secondary | ICD-10-CM | POA: Diagnosis not present

## 2020-09-18 DIAGNOSIS — Z95828 Presence of other vascular implants and grafts: Secondary | ICD-10-CM

## 2020-09-18 LAB — CBC WITH DIFFERENTIAL (CANCER CENTER ONLY)
Abs Immature Granulocytes: 0.01 10*3/uL (ref 0.00–0.07)
Basophils Absolute: 0 10*3/uL (ref 0.0–0.1)
Basophils Relative: 0 %
Eosinophils Absolute: 0.2 10*3/uL (ref 0.0–0.5)
Eosinophils Relative: 4 %
HCT: 29.7 % — ABNORMAL LOW (ref 36.0–46.0)
Hemoglobin: 9.7 g/dL — ABNORMAL LOW (ref 12.0–15.0)
Immature Granulocytes: 0 %
Lymphocytes Relative: 29 %
Lymphs Abs: 1.3 10*3/uL (ref 0.7–4.0)
MCH: 32.8 pg (ref 26.0–34.0)
MCHC: 32.7 g/dL (ref 30.0–36.0)
MCV: 100.3 fL — ABNORMAL HIGH (ref 80.0–100.0)
Monocytes Absolute: 1 10*3/uL (ref 0.1–1.0)
Monocytes Relative: 22 %
Neutro Abs: 2 10*3/uL (ref 1.7–7.7)
Neutrophils Relative %: 45 %
Platelet Count: 163 10*3/uL (ref 150–400)
RBC: 2.96 MIL/uL — ABNORMAL LOW (ref 3.87–5.11)
RDW: 15.5 % (ref 11.5–15.5)
WBC Count: 4.5 10*3/uL (ref 4.0–10.5)
nRBC: 0 % (ref 0.0–0.2)

## 2020-09-18 LAB — CMP (CANCER CENTER ONLY)
ALT: 8 U/L (ref 0–44)
AST: 20 U/L (ref 15–41)
Albumin: 3.5 g/dL (ref 3.5–5.0)
Alkaline Phosphatase: 63 U/L (ref 38–126)
Anion gap: 8 (ref 5–15)
BUN: 37 mg/dL — ABNORMAL HIGH (ref 8–23)
CO2: 24 mmol/L (ref 22–32)
Calcium: 9.2 mg/dL (ref 8.9–10.3)
Chloride: 109 mmol/L (ref 98–111)
Creatinine: 1.42 mg/dL — ABNORMAL HIGH (ref 0.44–1.00)
GFR, Estimated: 37 mL/min — ABNORMAL LOW (ref >60)
Glucose, Bld: 92 mg/dL (ref 70–99)
Potassium: 4.4 mmol/L (ref 3.5–5.1)
Sodium: 141 mmol/L (ref 135–145)
Total Bilirubin: 0.3 mg/dL (ref 0.3–1.2)
Total Protein: 7.2 g/dL (ref 6.5–8.1)

## 2020-09-18 LAB — SAMPLE TO BLOOD BANK

## 2020-09-18 MED ORDER — HEPARIN SOD (PORK) LOCK FLUSH 100 UNIT/ML IV SOLN
500.0000 [IU] | Freq: Once | INTRAVENOUS | Status: AC | PRN
  Administered 2020-09-18: 500 [IU]
  Filled 2020-09-18: qty 5

## 2020-09-18 MED ORDER — SODIUM CHLORIDE 0.9% FLUSH
10.0000 mL | Freq: Once | INTRAVENOUS | Status: AC
  Administered 2020-09-18: 10 mL
  Filled 2020-09-18: qty 10

## 2020-09-18 MED ORDER — SODIUM CHLORIDE 0.9% FLUSH
10.0000 mL | INTRAVENOUS | Status: DC | PRN
  Administered 2020-09-18: 10 mL
  Filled 2020-09-18: qty 10

## 2020-09-18 MED ORDER — PROCHLORPERAZINE MALEATE 10 MG PO TABS
10.0000 mg | ORAL_TABLET | Freq: Once | ORAL | Status: AC
  Administered 2020-09-18: 10 mg via ORAL

## 2020-09-18 MED ORDER — SODIUM CHLORIDE 0.9 % IV SOLN
Freq: Once | INTRAVENOUS | Status: AC
  Filled 2020-09-18: qty 250

## 2020-09-18 MED ORDER — SODIUM CHLORIDE 0.9 % IV SOLN
600.0000 mg/m2 | Freq: Once | INTRAVENOUS | Status: AC
  Administered 2020-09-18: 988 mg via INTRAVENOUS
  Filled 2020-09-18: qty 25.98

## 2020-09-18 MED ORDER — PROCHLORPERAZINE MALEATE 10 MG PO TABS
ORAL_TABLET | ORAL | Status: AC
  Filled 2020-09-18: qty 1

## 2020-09-18 MED ORDER — SODIUM CHLORIDE 0.9 % IV SOLN: 600.0000 mg/m2 | Freq: Once | INTRAVENOUS | Status: DC

## 2020-09-18 NOTE — Patient Instructions (Signed)
[facility]ICAL [facility]COL[facility]   ?Discharge Instructions: ?Thank you for choosing [facility] to provide your oncology and hematology care.  ? ?If you have a lab appointment with the Cancer Center, please go directly to the Cancer Center and check in at the registration area. ?  ?Wear comfortable clothing and clothing appropriate for easy access to any Portacath or PICC line.  ? ?We strive to give you quality time with your provider. You may need to reschedule your appointment if you arrive late (15 or more minutes).  Arriving late affects you and other patients whose appointments are after yours.  Also, if you miss three or more appointments without notifying the office, you may be dismissed from the clinic at the provider?s discretion.    ?  ?For prescription refill requests, have your pharmacy contact our office and allow 72 hours for refills to be completed.   ? ?Today you received the following chemotherapy and/or immunotherapy agents: gemcitabine    ?  ?To help prevent nausea and vomiting after your treatment, we encourage you to take your nausea medication as directed. ? ?BELOW ARE SYMPTOMS THAT SHOULD BE REPORTED IMMEDIATELY: ?*FEVER GREATER THAN 100.4 F (38 ?C) OR HIGHER ?*CHILLS OR SWEATING ?*NAUSEA AND VOMITING THAT IS NOT CONTROLLED WITH YOUR NAUSEA MEDICATION ?*UNUSUAL SHORTNESS OF BREATH ?*UNUSUAL BRUISING OR BLEEDING ?*URINARY PROBLEMS (pain or burning when urinating, or frequent urination) ?*BOWEL PROBLEMS (unusual diarrhea, constipation, pain near the anus) ?TENDERNESS IN MOUTH AND THROAT WITH OR WITHOUT PRESENCE OF ULCERS (sore throat, sores in mouth, or a toothache) ?UNUSUAL RASH, SWELLING OR PAIN  ?UNUSUAL VAGINAL DISCHARGE OR ITCHING  ? ?Items with * indicate a potential emergency and should be followed up as soon as possible or go to the Emergency Department if any problems should occur. ? ?Please show the CHEMOTHERAPY ALERT CARD or IMMUNOTHERAPY ALERT CARD at check-in  to the Emergency Department and triage nurse. ? ?Should you have questions after your visit or need to cancel or reschedule your appointment, please contact [facility]  Dept: [phone]  and follow the prompts.  Office hours are 8:00 a.m. to 4:30 p.m. Monday - Friday. Please note that voicemails left after 4:00 p.m. may not be returned until the following business day.  We are closed weekends and major holidays. You have access to a nurse at all times for urgent questions. Please call the main number to the clinic Dept: [phone] and follow the prompts. ? ? ?For any non-urgent questions, you may also contact your provider using MyChart. We now offer e-Visits for anyone 18 and older to request care online for non-urgent symptoms. For details visit [email or web address]. ?  ?Also download the MyChart app! Go to the app store, search "MyChart", open the app, select [facility], and log in with your MyChart username and password. ? ?Due to Covid, a mask is required upon entering the hospital/clinic. If you do not have a mask, one will be given to you upon arrival. For doctor visits, patients may have 1 support person aged 18 or older with them. For treatment visits, patients cannot have anyone with them due to current Covid guidelines and our immunocompromised population.  ? ?

## 2020-09-18 NOTE — Progress Notes (Signed)
Per Cassie Heilingoetter PA-C, ok to treat with elevated BP.

## 2020-09-19 ENCOUNTER — Telehealth: Payer: Self-pay | Admitting: Physician Assistant

## 2020-09-19 DIAGNOSIS — Z20822 Contact with and (suspected) exposure to covid-19: Secondary | ICD-10-CM | POA: Diagnosis not present

## 2020-09-19 NOTE — Telephone Encounter (Signed)
Scheduled appointment per 07/06 los. Patient is aware. 

## 2020-09-20 ENCOUNTER — Other Ambulatory Visit: Payer: Self-pay

## 2020-09-20 ENCOUNTER — Ambulatory Visit (INDEPENDENT_AMBULATORY_CARE_PROVIDER_SITE_OTHER): Payer: Medicare Other | Admitting: Cardiovascular Disease

## 2020-09-20 ENCOUNTER — Encounter: Payer: Self-pay | Admitting: Cardiovascular Disease

## 2020-09-20 VITALS — BP 140/60 | HR 72 | Resp 18 | Ht 65.0 in | Wt 126.4 lb

## 2020-09-20 DIAGNOSIS — I2510 Atherosclerotic heart disease of native coronary artery without angina pectoris: Secondary | ICD-10-CM

## 2020-09-20 DIAGNOSIS — C787 Secondary malignant neoplasm of liver and intrahepatic bile duct: Secondary | ICD-10-CM | POA: Diagnosis not present

## 2020-09-20 DIAGNOSIS — N1832 Chronic kidney disease, stage 3b: Secondary | ICD-10-CM | POA: Diagnosis not present

## 2020-09-20 DIAGNOSIS — E7800 Pure hypercholesterolemia, unspecified: Secondary | ICD-10-CM

## 2020-09-20 DIAGNOSIS — I700 Atherosclerosis of aorta: Secondary | ICD-10-CM | POA: Diagnosis not present

## 2020-09-20 DIAGNOSIS — C221 Intrahepatic bile duct carcinoma: Secondary | ICD-10-CM | POA: Diagnosis not present

## 2020-09-20 DIAGNOSIS — E1122 Type 2 diabetes mellitus with diabetic chronic kidney disease: Secondary | ICD-10-CM

## 2020-09-20 DIAGNOSIS — I10 Essential (primary) hypertension: Secondary | ICD-10-CM

## 2020-09-20 DIAGNOSIS — I5032 Chronic diastolic (congestive) heart failure: Secondary | ICD-10-CM | POA: Diagnosis not present

## 2020-09-20 DIAGNOSIS — I248 Other forms of acute ischemic heart disease: Secondary | ICD-10-CM

## 2020-09-20 NOTE — Progress Notes (Signed)
Patient ID: Donna May, female   DOB: 11-Nov-1940, 80 y.o.   MRN: [id number]    Cardiology Office Note    Date:  09/20/2020   ID:  Donna May, [place]ak [birth year], MRN [id number]  PCP:  [name], MD  Cardiologist:   Sanda Klein, MD   Chief Complaint  Patient presents with   Congestive Heart Failure    History of Present Illness:  Donna May is a 80 y.o. female with chronic diastolic heart failure, long-standing systemic hypertension, PAD (nonobstructive bilateral renal artery disease), long-standing hyperlipidemia and type 2 diabetes mellitus with diabetic nephropathy.She quit smoking 27 years ago. She has a very strong family history of premature heart disease in both parents and one of her brothers.  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  She has a remote history of breast cancer and some residual left arm lymphedema following lymph node dissection.  She was hospitalized with acute pulmonary edema in October 2021 in the setting of severe anemia and marked hypertension.  She improved with treatment with diuretics and antibiotics, but was re-hospitalized in November 2021, once again with severe hypertension and hypoxemia.  She has not required heart failure hospitalization or adjustment in diuretic dose in the last 6 months.  Her hemoglobin has improved substantially, most recently up to 9.6.  After her diagnosis of biliary cancer she has lost a lot of weight, but this has stabilized at approximately 126-129 pounds.  She does not have edema.  She denies orthopnea, PND and is able to take care of household chores without dyspnea.  She has never complained of angina at rest or with activity.  She has not had recent problems with dizziness and has not experienced syncope.  She denies palpitations.  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  She does not have  claudication.  She is currently on a platinum/gemcitabine based chemotherapy regimen, but she tells me that she will be started on an immunomodulator drug, Imfinzi in the next couple of weeks.  Her echocardiogram in October 2021 showed that left ventricular systolic function was normal EF of 55-60%, there was moderate LVH and echo confirmed grade 2 diastolic dysfunction.  No major valvular abnormalities were reported.  The myocardium was described as having a speckled appearance and amyloidosis was considered.  Note that the mitral annulus velocities were really not that low (lateral 8 cm/second, medial 4.5 cm/second).  She does not have low voltage on her EKG.    She had a low risk nuclear stress test in July 2018.  Her echocardiogram in 2014 showed normal left ventricular systolic function, mild left ventricular hypertrophy, mild pulmonary artery hypertension (37 mmHg).  She had no evidence of stenoses in the lower extremities on arterial duplex study performed in 2019.  Mild plaque without obstruction was seen on ultrasound of the renal arteries in 2016.  Aortic atherosclerosis is incidentally noted on multiple imaging studies performed for cholangiocarcinoma.   Past Medical History:  Diagnosis Date   Anemia    Anxiety    Arthritis    Breast cancer ([place]) 1992   right   Cataract    Bilateral eyes - surgery to remove   Chronic diastolic CHF (congestive heart failure) (HCC)    Chronic kidney disease    CKD stage 3   Complication of anesthesia    "something they use make me itch for a couple of days."  Depression    Diabetes mellitus    type 2   Duodenitis 01/18/2002   Fainting spell    GERD (gastroesophageal reflux disease)    Heart murmur    never has caused any problems   Hiatal hernia 08/08/2008, 01/18/2002   History of pneumonia    x 2   Hyperlipidemia    Hypertension    Liver cancer ([name]) 08/2018   chemotherapy   Lymphedema 2017   Right arm   Peripheral neuropathy      Past Surgical History:  Procedure Laterality Date   AXILLARY SURGERY     cyst removal, right   COLONOSCOPY  09/23/2018   Dr. Havery Moros - polyps   EYE SURGERY Bilateral    cataracts to remove   LAPAROSCOPY N/A 11/17/2018   Procedure: LAPAROSCOPY DIAGNOSTIC, INTRAOPERATIVE ULTRASOUND, PERITONEAL BIOPSIES;  Surgeon: [name], MD;  Location: [facility];  Service: General;  Laterality: N/A;  GENERAL AND EPIDURAL   LIVER BIOPSY  08/2018   Dr. Lindwood Coke   MASTECTOMY  1992   right, with flap   [facility] [place]  06/11/2009   Protocol:[place], post stress EF58%, EKG negative for ischemia, low risk   PORTACATH PLACEMENT N/A 12/02/2018   Procedure: INSERTION PORT-A-CATH;  Surgeon: [name], MD;  Location: [facility];  Service: General;  Laterality: N/A;   RECONSTRUCTION BREAST W/ TRAM FLAP Right    TONSILLECTOMY  1958   TRANSTHORACIC ECHOCARDIOGRAM  12/24/2009   LVEF =>55%, normal study   UPPER GI ENDOSCOPY      Outpatient Medications Prior to Visit  Medication Sig Dispense Refill   amLODipine (NORVASC) 10 MG tablet Take 1 tablet (10 mg total) by mouth daily. 90 tablet 3   aspirin 81 MG tablet Take 81 mg by mouth daily.      carvedilol (COREG) 25 MG tablet Take 1 tablet (25 mg total) by mouth 2 (two) times daily with a meal. 180 tablet 3   furosemide (LASIX) 40 MG tablet Take 1 tablet (40 mg total) by mouth daily. 30 tablet 11   gabapentin (NEURONTIN) 100 MG capsule Take 1 capsule (100 mg total) by mouth every 8 (eight) hours as needed. 180 capsule 2   glucose blood test strip 1 each by Other route 2 (two) times daily. Use Onetouch verio test strips as instructed to check blood sugar twice daily. 100 each 2   hydrALAZINE (APRESOLINE) 100 MG tablet TAKE THREE TIMES DAILY-AM, MIDDLE OF THE DAY AND IN THE EVENING. 270 tablet 3   KLOR-CON M10 10 MEQ tablet TAKE 1 TABLET BY MOUTH EVERY DAY 90 tablet 1   lansoprazole (PREVACID) 15 MG capsule Take 1 capsule (15 mg total) by mouth daily.  Please keep your upcoming appointment for further refills. 90 capsule 0   lidocaine-prilocaine (EMLA) cream Apply 1 application topically as needed. 30 g 1   loratadine (CLARITIN) 10 MG tablet Take 10 mg by mouth daily as needed for allergies.     Omega 3 1000 MG CAPS Take 2,000 mg by mouth daily.      sitaGLIPtin (JANUVIA) 100 MG tablet Take 1 tablet (100 mg total) by mouth daily. 90 tablet 3   valsartan (DIOVAN) 320 MG tablet Take 1 tablet (320 mg total) by mouth daily. 90 tablet 3   rosuvastatin (CRESTOR) 20 MG tablet Take 1 tablet (20 mg total) by mouth daily. 90 tablet 3   Facility-Administered Medications Prior to Visit  Medication Dose Route Frequency Provider Last Rate Last Admin  triamcinolone acetonide (KENALOG) 10 MG/ML injection 10 mg  10 mg Other Once Harriet Masson, DPM         Allergies:   Patient has no known allergies.   Social History   Socioeconomic History   Marital status: Single    Spouse name: Not on file   Number of children: 2   Years of education: 12   Highest education level: Not on file  Occupational History   Occupation: retired    Comment: disabled  Tobacco Use   Smoking status: Former    Packs/day: 0.25    Years: 25.00    Pack years: 6.25    Types: Cigarettes    Quit date: 1990    Years since quitting: 32.5   Smokeless tobacco: Never  Vaping Use   Vaping Use: Never used  Substance and Sexual Activity   Alcohol use: Not Currently    Alcohol/week: 0.0 standard drinks   Drug use: Never   Sexual activity: Yes    Partners: Male    Birth control/protection: Condom, Post-menopausal    Comment: First sexual encounter age 16. Fewer than 5 partners in life time.  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   Has had 3  Covid vaccines   Social Determinants of Radio broadcast assistant Strain: Low Risk    Difficulty of Paying Living Expenses: Not hard at all  Food Insecurity: No Food Insecurity   Worried About Charity fundraiser in the Last Year: Never true   Arboriculturist in the Last Year: Never true  Transportation Needs: No Transportation Needs   Lack of Transportation (Medical): No   Lack of Transportation (Non-Medical): No  Physical Activity: Inactive   Days of Exercise per Week: 0 days   Minutes of Exercise per Session: 0 min  Stress: No Stress Concern Present   Feeling of Stress : Not at all  Social Connections: Moderately Isolated   Frequency of Communication with Friends and Family: More than three times a week   Frequency of Social Gatherings with Friends and Family: More than three times a week   Attends Religious Services: More than 4 times per year   Active Member of [employer] or Organizations: No   Attends Archivist Meetings: Never   Marital Status: Widowed     Family History:  The patient's family history includes Breast cancer in her cousin; Diabetes in her brother; Heart attack in her maternal grandmother; Heart disease in her brother, father, and mother; Hypertension in her father and mother; Multiple sclerosis in her mother; Prostate cancer (age of onset: 81) in her brother; Stroke in her father and maternal grandfather.   ROS:   Please see the history of present illness.    ROS All other systems are reviewed and are negative.   PHYSICAL EXAM:   VS:  BP 140/60   Pulse 72   Resp 18   Ht 5\' 5"  (1.651 m)   Wt 126 lb 6.4 oz (57.3 kg)   LMP  (LMP Unknown)   SpO2 100%   BMI 21.03 kg/m        General: Alert, oriented x3, no distress, Very lean Head: no evidence of trauma, PERRL, EOMI,  no exophtalmos or lid lag, no myxedema, no xanthelasma; normal ears, nose and oropharynx Neck: normal jugular venous pulsations and no hepatojugular reflux; brisk carotid pulses  without delay and no carotid bruits Chest: clear to auscultation, no signs of consolidation by percussion or palpation, normal fremitus, symmetrical and full respiratory excursions Cardiovascular: normal position and quality of the apical impulse, regular rhythm, normal first and second heart sounds, no murmurs, rubs or gallops Abdomen: no tenderness or distention, no masses by palpation, no abnormal pulsatility or arterial bruits, normal bowel sounds, no hepatosplenomegaly Extremities: no clubbing, cyanosis or edema; 2+ radial, ulnar and brachial pulses bilaterally; 2+ right femoral, posterior tibial and dorsalis pedis pulses; 2+ left femoral, posterior tibial and dorsalis pedis pulses; no subclavian or femoral bruits Neurological: grossly nonfocal Psych: Normal mood and affect    Wt Readings from Last 3 Encounters:  09/20/20 126 lb 6.4 oz (57.3 kg)  09/18/20 129 lb 1.6 oz (58.6 kg)  08/08/20 128 lb 12 oz (58.4 kg)      Studies/Labs Reviewed:   EKG:  EKG is ordered today.  This shows normal sinus rhythm, left ventricular hypertrophy with repolarization changes and sharp deep Q waves in leads I and aVL, QTC 396 ms Recent Labs: 01/18/2020: B Natriuretic Peptide 1,816.9 04/03/2020: Magnesium 2.4 09/18/2020: ALT 8; BUN 37; Creatinine 1.42; Hemoglobin 9.7; Platelet Count 163; Potassium 4.4; Sodium 141   Lipid Panel     Component Value Date/Time   CHOL 239 (H) 02/14/2020 1122   TRIG 55 02/14/2020 1122   HDL 85 02/14/2020 1122   CHOLHDL 2.8 02/14/2020 1122   CHOLHDL 3 09/01/2018 1435   VLDL 9.2 09/01/2018 1435   LDLCALC 145 (H) 02/14/2020 1122   LABVLDL 9 02/14/2020 1122     09/01/2018 total cholesterol 190, triglycerides 46, HDL 60, LDL 120, A1c 6.4% 01/04/2019 hemoglobin 9.2, creatinine 1.08, potassium 4.0, normal TSH and ALT  ASSESSMENT:    1. Chronic diastolic heart failure ([place])   2. Essential hypertension   3. Stage 3b chronic kidney disease ([place])   4. Type 2 diabetes  mellitus with stage 3b chronic kidney disease, without long-term current use of insulin ([place])   5. Hypercholesterolemia   6. Coronary artery calcification seen on CAT scan   7. Aortic atherosclerosis ([place])   8. 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  Blood pressure control is not perfect but is acceptable.  She has a tendency to decompensate when she becomes more severely anemic.  Based on previous experience, I would recommend avoiding allowing the hemoglobin to drop below 8.  She primarily appears to have hypertensive heart disease with secondary LVH and diastolic dysfunction.  She has never had angina although I cannot completely exclude the fact that she may have painless coronary insufficiency as a cause for her heart failure.  She is not a good candidate for coronary invasive evaluation and treatment due to her comorbid conditions so I would continue medical therapy.Marland Kitchen HTN: Despite significant weight loss, she continues to have difficult to control hypertension.  She is on maximum usual doses of carvedilol, amlodipine and valsartan.  She is occasionally missed or delayed taking carvedilol since she was told that it has to be taken with a meal.  This may have caused some episodes of rebound hypertension.  I recommended taking the carvedilol twice  a day regardless of association with meals.  As many patients do, she often will miss the midday dose of hydralazine.  No changes were made to her medications today other than the advice given above to avoid interrupting carvedilol. CKD 3: Creatinine had briefly worsened this spring, but is back to baseline of 1.4 (GFR 35-40).  Diuretic dose may need to be adjusted if she develops side effects from chemotherapy such as nausea/vomiting or diarrhea, that prevent maintenance of normal  volume status. DM: Well-controlled HLP: Weight loss has stabilized around 126-129 pounds.  She has inquired about needing treatment for her hypercholesterolemia.  I do not think is a good idea to put her on statins considering that she has advanced biliary cancer.  We discussed the fact that lipid-lowering therapy has a significant impact on long-term outcomes.  Even though she has imaging evidence of coronary and peripheral arterial atherosclerosis, she has not had clinically meaningful CAD or PAD by the age of 16.  She is on a complicated medical regimen with many potential drug interactions.  I would advise against starting aggressive lipid-lowering therapy. Coronary calcification: She does not have angina pectoris and she had a low risk nuclear stress test 2 years ago. Aortic atherosclerosis/PAD:  normal ABI in August 2019, not reevaluated since.  Denies claudication. Cholangiocarcinoma: This is not resectable, she is not a candidate for immunotherapy and the prognosis is very guarded.  To start Imfinzi in the near future.    Medication Adjustments/Labs and Tests Ordered: Current medicines are reviewed at length with the patient today.  Concerns regarding medicines are outlined above.  Medication changes, Labs and Tests ordered today are listed in the Patient Instructions below. Patient Instructions  Medication Instructions:  No changes *If you need a refill on your cardiac medications before your next appointment, please call your pharmacy*   Lab Work: None ordered If you have labs (blood work) drawn today and your tests are completely normal, you will receive your results only by: [facility] (if you have MyChart) OR A paper copy in the mail If you have any lab test that is abnormal or we need to change your treatment, we will call you to review the results.   Testing/Procedures: None ordered   Follow-Up: At [facility], you and your health needs are our priority.  As part  of our continuing mission to provide you with exceptional heart care, we have created designated Provider Care Teams.  These Care Teams include your primary Cardiologist (physician) and Advanced Practice Providers (APPs -  Physician Assistants and Nurse Practitioners) who all work together to provide you with the care you need, when you need it.  We recommend signing up for the patient portal called "MyChart".  Sign up information is provided on this After Visit Summary.  MyChart is used to connect with patients for Virtual Visits (Telemedicine).  Patients are able to view lab/test results, encounter notes, upcoming appointments, etc.  Non-urgent messages can be sent to your provider as well.   To learn more about what you can do with MyChart, go to [email or web address].    Your next appointment:   12 month(s)  The format for your next appointment:   In Person  Provider:   You may see Sanda Klein, MD or one of the following Advanced Practice Providers on your designated Care Team:   Almyra Deforest, PA-C Fabian Sharp, [place] or  Roby Lofts, PA-C       Signed, Sanda Klein, MD  09/20/2020 4:14  PM    [facility] [address], [place], [place]  [address] Phone: [phone]; Fax: [phone]

## 2020-09-20 NOTE — Patient Instructions (Signed)

## 2020-09-24 ENCOUNTER — Encounter: Payer: Self-pay | Admitting: Obstetrics & Gynecology

## 2020-09-24 DIAGNOSIS — M85852 Other specified disorders of bone density and structure, left thigh: Secondary | ICD-10-CM | POA: Diagnosis not present

## 2020-09-24 DIAGNOSIS — M85851 Other specified disorders of bone density and structure, right thigh: Secondary | ICD-10-CM | POA: Diagnosis not present

## 2020-09-24 DIAGNOSIS — Z1231 Encounter for screening mammogram for malignant neoplasm of breast: Secondary | ICD-10-CM | POA: Diagnosis not present

## 2020-09-24 DIAGNOSIS — Z853 Personal history of malignant neoplasm of breast: Secondary | ICD-10-CM | POA: Diagnosis not present

## 2020-09-25 ENCOUNTER — Ambulatory Visit: Payer: Medicare Other

## 2020-09-25 ENCOUNTER — Other Ambulatory Visit: Payer: Medicare Other

## 2020-09-25 ENCOUNTER — Other Ambulatory Visit: Payer: Self-pay | Admitting: Hematology

## 2020-10-01 ENCOUNTER — Other Ambulatory Visit: Payer: Self-pay | Admitting: Hematology

## 2020-10-01 NOTE — Progress Notes (Signed)
[place]   [phone] [phone]   Clinic Follow up Note   May Care Team: [name], MD as PCP - General (Family Medicine) [name], [name], MD as PCP - Cardiology (Cardiology) [name], [name], MD as Consulting Physician (Cardiology) [name], MD as Consulting Physician (Obstetrics and Gynecology) [name], [name], NP as Nurse Practitioner (Hematology and Oncology) [facility] [name], MD as Consulting Physician (Hematology) [name], [name], MD as Consulting Physician (Gastroenterology) [name], RN (Inactive) as Oncology Nurse Navigator [name], MD as Consulting Physician (General Surgery) 10/02/2020  CHIEF COMPLAINT: Follow up cholangio  SUMMARY OF ONCOLOGIC HISTORY: Oncology History Overview Note  Cancer Staging Intrahepatic cholangiocarcinoma [facility]) Staging form: Intrahepatic Bile Duct, AJCC 8th Edition - Clinical stage from 09/23/2018: Stage IB (cT1b, cN0, cM0) - Signed by [name], MD on 10/06/2018    Intrahepatic cholangiocarcinoma ([place])  09/07/2018 Imaging   CT Chest IMPRESSION: 1. New, enhancing mass involving segment 4 of Donna liver and fundus of gallbladder is concerning for malignancy. This may represent either metastatic disease from breast cancer or neoplasm primary to Donna liver or hepatic biliary tree. Further evaluation with contrast enhanced CT of Donna abdomen and pelvis is recommended. 2. No findings to suggest metastatic disease within Donna chest. 3.  Aortic Atherosclerosis (ICD10-I70.0). 4. Coronary artery calcifications.   09/13/2018 Pathology Results   Diagnosis Liver, needle/core biopsy - ADENOCARCINOMA. Microscopic Comment Immunohistochemistry for CK7 is positive. CK20, TTF1, CDX-2, GATA-3, PAX 8, Qualitative ER, p63 and CK5/6 are negative. Donna provided clinical history of remote mammary carcinoma is noted. Based on Donna morphology and immunophenotype of Donna  adenocarcinoma observed in this specimen, primary cholangiocarcinoma is favored. Clinical and radiologic correlation are  encouraged. Results reported to [employer] on 09/15/2018. Intradepartmental consultation (Dr. Vic Ripper).   09/13/2018 Initial Diagnosis   Cholangiocarcinoma ([place])   09/23/2018 Procedure   Colonoscopy by Dr. Havery Moros 09/23/18  IMPRESSION - Two 3 to 4 mm polyps in Donna ascending colon, removed with a cold snare. Resected and retrieved. - Five 3 to 5 mm polyps in Donna transverse colon, removed with a cold snare. Resected and retrieved. - One 5 mm polyp at Donna splenic flexure, removed with a cold snare. Resected and retrieved. - Three 3 to 5 mm polyps in Donna sigmoid colon, removed with a cold snare. Resected and retrieved. - Donna examination was otherwise normal. Upper Endopscy by Dr. Havery Moros 09/23/18  IMPRESSION - Esophagogastric landmarks identified. - 2 cm hiatal hernia. - Normal esophagus otherwise. - A single gastric polyp. Resected and retrieved. - Mild gastritis. Biopsied. - Normal duodenal bulb and second portion of Donna duodenum.   09/23/2018 Pathology Results   Diagnosis 09/23/18 1. Surgical [P], duodenum - BENIGN SMALL BOWEL MUCOSA. - NO ACTIVE INFLAMMATION OR VILLOUS ATROPHY IDENTIFIED. 2. Surgical [P], stomach, polyp - HYPERPLASTIC POLYP(S). - THERE IS NO EVIDENCE OF MALIGNANCY. 3. Surgical [P], gastric antrum and gastric body - CHRONIC INACTIVE GASTRITIS. - THERE IS NO EVIDENCE OF HELICOBACTER-PYLORI, DYSPLASIA, OR MALIGNANCY. - SEE COMMENT. 4. Surgical [P], colon, sigmoid, splenic flexure, transverse and ascending, polyp (9) - TUBULAR ADENOMA(S). - SESSILE SERRATED POLYP WITHOUT CYTOLOGIC DYSPLASIA. - HIGH GRADE DYSPLASIA IS NOT IDENTIFIED. 5. Surgical [P], colon, sigmoid, polyp (2) - HYPERPLASTIC POLYP(S). - THERE IS NO EVIDENCE OF MALIGNANCY.   09/23/2018 Cancer Staging   Staging form: Intrahepatic Bile Duct, AJCC 8th Edition - Clinical  stage from 09/23/2018: Stage IB (cT1b, cN0, cM0) - Signed by [name], MD on  10/06/2018    09/29/2018 PET scan   PET 09/29/18 IMPRESSION: 1. Hypermetabolic mass in Donna RIGHT hepatic lobe consistent with biopsy proven adenocarcinoma. No additional liver metastasis. 2. No evidence of local breast cancer recurrence in Donna RIGHT breast or RIGHT axilla. 3. Mild bilateral hypermetabolic adrenal glands is favored benign hyperplasia. 4. No evidence of additional metastatic disease on skull base to thigh FDG PET scan.   11/06/2018 Genetic Testing   Negative genetic testing on Donna [facility] Common Hereditary Cancers panel. A variant of uncertain significance was identified in one of her APC genes, called c.1243G>A (p.Ala415Thr).  Donna Common Hereditary Cancers Panel offered by [facility] includes sequencing and/or deletion duplication testing of Donna following 48 genes: APC, ATM, AXIN2, BARD1, BMPR1A, BRCA1, BRCA2, BRIP1, CDH1, CDK4, CDKN2A (p14ARF), CDKN2A (p16INK4a), CHEK2, CTNNA1, DICER1, EPCAM (Deletion/duplication testing only), GREM1 (promoter region deletion/duplication testing only), KIT, MEN1, MLH1, MSH2, MSH3, MSH6, MUTYH, NBN, NF1, NHTL1, PALB2, PDGFRA, PMS2, POLD1, POLE, PTEN, RAD50, RAD51C, RAD51D, RNF43, SDHB, SDHC, SDHD, SMAD4, SMARCA4. STK11, TP53, TSC1, TSC2, and VHL.  Donna following genes were evaluated for sequence changes only: SDHA and HOXB13 c.251G>A variant only.    11/17/2018 Pathology Results   Diagnosis 11/17/18 1. Soft tissue, biopsy, Diaphragmatic nodules - METASTATIC ADENOCARCINOMA, CONSISTENT WITH May'S CLINICAL HISTORY OF CHOLANGIOCARCINOMA. SEE NOTE 2. Liver, biopsy, Left - LIVER PARENCHYMA WITH A BENIGN FIBROTIC NODULE - NO EVIDENCE OF MALIGNANCY 3. Stomach, biopsy - BENIGN PAPILLARY MESOTHELIAL HYPERPLASIA - NO EVIDENCE OF MALIGNANCY   11/29/2018 Imaging   CT CAP WO Contrast  IMPRESSION: 1. Dominant liver mass appears grossly stable from 09/29/2018. Additional liver  lesions are too small to characterize but were not shown to be hypermetabolic on PET. 2. Mild nodularity of both adrenal glands with associated hypermetabolism on 09/29/2018. Continued attention on follow-up exams is warranted. 3. Small right lower lobe nodules, stable from 09/07/2018. Again, attention on follow-up is recommended. 4. Trace bilateral pleural fluid. 5. Aortic atherosclerosis (ICD10-170.0). Coronary artery calcification. 6. Enlarged pulmonic trunk, indicative of pulmonary arterial hypertension.     11/30/2018 - 06/14/2019 Chemotherapy   First line chemo Oxaliplatin and gemcitabine q2weeks starting 11/30/18. Stopped Oxaliplatin on 05/03/19 due to worsening neuropathy. Added Cisplatin with C13 on 05/17/19 and stopped on C15 06/14/19 due to neuropathy. Reduced to maintenance single agent Gemcitabine on 06/28/19.    01/20/2019 Imaging   CT CAP IMPRESSION: restaging  1. Mild interval increase in size of dominant liver mass involving segment 4 and segment 5. 2. No significant or progressive adrenal nodularity identified to suggest metastatic disease. 3. Unchanged appearance of small right lower lobe and lingular lung nodules, nonspecific.   03/21/2019 PET scan   IMPRESSION: 1. Large right hepatic mass with SUV uptake near background hepatic activity, dramatic response to therapy, also with decrease in size when compared to Donna prior study. 2. Signs of prior right mastectomy and axillary dissection as before. 3. Left adrenal activity in no longer above Donna level of activity seen in Donna contralateral, right adrenal gland or in Donna liver. Attention on follow-up. 4. No new signs of disease.   06/27/2019 PET scan   IMPRESSION: 1. Further decrease in size of a dominant hepatic mass which is non FDG avid. 2. No evidence of metastatic disease. 3. No evidence of left adrenal hypermetabolism or mass. 4. Incidental findings, including: Uterine fibroids. Coronary artery atherosclerosis.  Aortic Atherosclerosis (ICD10-I70.0). Pulmonary artery enlargement suggests pulmonary arterial hypertension.   06/28/2019 -  Chemotherapy   Maintenance single agent Gemcitabine q2weeks starting  on 06/28/19 with C16.  ------On chemo break since 12/27/19.  ------She restarted Single Agent Gemcitabine q2weeks on 04/03/20 due to mild disease progression in liver on 02/2020 PET    09/15/2019 PET scan   IMPRESSION: 1. In Donna region of regional tumor at Donna junction of Donna right and left hepatic lobe, there is continued hypodensity and overall activity slightly less than that of Donna surrounding normal liver, indicating effective response to therapy. No current worrisome hypermetabolic lesion is identified in Donna liver or elsewhere. 2. Chronic asymmetric edema in Donna subcutaneous tissues of Donna right upper extremity, nonspecific. Donna May has had a prior right mastectomy and right axillary dissection. 3. Other imaging findings of potential clinical significance: Aortic Atherosclerosis (ICD10-I70.0). Coronary atherosclerosis. Mild cardiomegaly. Small right and trace left pleural effusions. Subcutaneous and mild mesenteric edema. Suspected uterine fibroids.   12/18/2019 PET scan   IMPRESSION: 1. No significant abnormal activity to suggest active/recurrent malignancy. 2. Lingual tonsillar activity is mildly increased from prior but probably incidental/physiologic, attention on follow up suggested. 3. Other imaging findings of potential clinical significance: Third spacing of fluid with trace ascites, mesenteric edema, subcutaneous edema, and possibly subtle pulmonary edema. Mild cardiomegaly. Aortic Atherosclerosis (ICD10-I70.0). Coronary atherosclerosis. Right ovarian dermoid. Calcified uterine fibroids. Renal cysts. Right glenohumeral arthropathy. Mild to moderate scoliosis. Suspected pulmonary arterial hypertension.   03/14/2020 PET scan   IMPRESSION: 1. New small focus of hypermetabolism  in segment 4A of Donna liver worrisome for new metastatic focus. 2. No other findings for metastatic disease involving Donna neck, chest, abdomen or pelvis.     06/11/2020 PET scan   IMPRESSION: 1. No suspicious hypermetabolic activity within Donna neck, chest, abdomen or pelvis. 2. Donna treated dominant peripheral cholangiocarcinoma appears slightly enlarged compared with recent prior studies, and there is a questionable new lesion more superiorly in Donna left lobe. Although without associated hypermetabolic activity, these lesions are suspicious and would be better assessed with abdominal MRI without and with contrast.   10/09/2020 -  Chemotherapy    May is on Treatment Plan: PANCREAS GEM/CIS Q14D   May is on Antibody Plan: BLADDER DURVALUMAB Q14D       CURRENT THERAPY:  1.Maintenance single agent Gemcitabine q2weeks starting on 06/28/19 with C16. On chemo break since 12/27/19. She restarted Single Agent Gemcitabine q2weeks on 04/03/20 due to mild disease progression. 2.Adding imfinzi due to mild progression on 09/26/20 PET scan, delayed due to insurance   INTERVAL HISTORY: Donna May returns for follow up and treatment. She continues gemcitabine q2 weeks. Due to mild progression on recent scan, Donna plan is to add Imfinzi.  She is doing well today, no change.  Energy and appetite are normal.  Manages constipation, no nausea/vomiting.  She has occasional pain in Donna side but resolves after bowel movement.  Neuropathy is slightly worse today, hands and feet feel stiff, perhaps due to Donna cold temperature in Donna exam room.  She takes gabapentin sparingly, makes her drowsy.  Denies fever, chills, cough, chest pain, dyspnea, rash, mucositis, leg edema, or other concerns.    MEDICAL HISTORY:  Past Medical History:  Diagnosis Date   Anemia    Anxiety    Arthritis    Breast cancer ([place]) 1992   right   Cataract    Bilateral eyes - surgery to remove   Chronic diastolic CHF (congestive  heart failure) (HCC)    Chronic kidney disease    CKD stage 3   Complication of anesthesia    "something they use  make me itch for a couple of days."   Depression    Diabetes mellitus    type 2   Duodenitis 01/18/2002   Fainting spell    GERD (gastroesophageal reflux disease)    Heart murmur    never has caused any problems   Hiatal hernia 08/08/2008, 01/18/2002   History of pneumonia    x 2   Hyperlipidemia    Hypertension    Liver cancer ([place]) 08/2018   chemotherapy   Lymphedema 2017   Right arm   Peripheral neuropathy     SURGICAL HISTORY: Past Surgical History:  Procedure Laterality Date   AXILLARY SURGERY     cyst removal, right   COLONOSCOPY  09/23/2018   Dr. Havery Moros - polyps   EYE SURGERY Bilateral    cataracts to remove   LAPAROSCOPY N/A 11/17/2018   Procedure: LAPAROSCOPY DIAGNOSTIC, INTRAOPERATIVE ULTRASOUND, PERITONEAL BIOPSIES;  Surgeon: [name], MD;  Location: [place];  Service: General;  Laterality: N/A;  GENERAL AND EPIDURAL   LIVER BIOPSY  08/2018   Dr. Lindwood Coke   MASTECTOMY  1992   right, with flap   NM [place]  06/11/2009   Protocol:[place], post stress EF58%, EKG negative for ischemia, low risk   PORTACATH PLACEMENT N/A 12/02/2018   Procedure: INSERTION PORT-A-CATH;  Surgeon: [name], MD;  Location: [place];  Service: General;  Laterality: N/A;   RECONSTRUCTION BREAST W/ TRAM FLAP Right    TONSILLECTOMY  1958   TRANSTHORACIC ECHOCARDIOGRAM  12/24/2009   LVEF =>55%, normal study   UPPER GI ENDOSCOPY      I have reviewed Donna social history and family history with Donna May and they are unchanged from previous note.  ALLERGIES:  has No Known Allergies.  MEDICATIONS:  Current Outpatient Medications  Medication Sig Dispense Refill   amLODipine (NORVASC) 10 MG tablet Take 1 tablet (10 mg total) by mouth daily. 90 tablet 3   aspirin 81 MG tablet Take 81 mg by mouth daily.      carvedilol (COREG) 25 MG tablet Take 1 tablet (25  mg total) by mouth 2 (two) times daily with a meal. 180 tablet 3   furosemide (LASIX) 40 MG tablet Take 1 tablet (40 mg total) by mouth daily. 30 tablet 11   gabapentin (NEURONTIN) 100 MG capsule Take 1 capsule (100 mg total) by mouth every 8 (eight) hours as needed. 180 capsule 2   glucose blood test strip 1 each by Other route 2 (two) times daily. Use Onetouch verio test strips as instructed to check blood sugar twice daily. 100 each 2   hydrALAZINE (APRESOLINE) 100 MG tablet TAKE THREE TIMES DAILY-AM, MIDDLE OF Donna DAY AND IN Donna EVENING. 270 tablet 3   KLOR-CON M10 10 MEQ tablet TAKE 1 TABLET BY MOUTH EVERY DAY 90 tablet 1   lansoprazole (PREVACID) 15 MG capsule Take 1 capsule (15 mg total) by mouth daily. Please keep your upcoming appointment for further refills. 90 capsule 0   lidocaine-prilocaine (EMLA) cream Apply 1 application topically as needed. 30 g 1   loratadine (CLARITIN) 10 MG tablet Take 10 mg by mouth daily as needed for allergies.     Omega 3 1000 MG CAPS Take 2,000 mg by mouth daily.      sitaGLIPtin (JANUVIA) 100 MG tablet Take 1 tablet (100 mg total) by mouth daily. 90 tablet 3   valsartan (DIOVAN) 320 MG tablet Take 1 tablet (320 mg total) by mouth daily. 90 tablet 3  Current Facility-Administered Medications  Medication Dose Route Frequency Provider Last Rate Last Admin   triamcinolone acetonide (KENALOG) 10 MG/ML injection 10 mg  10 mg Other Once Harriet Masson, DPM        PHYSICAL EXAMINATION: ECOG PERFORMANCE STATUS: 1 - Symptomatic but completely ambulatory  Vitals:   10/02/20 0916  BP: (!) 178/63  Pulse: (!) 58  Resp: 18  Temp: 98.6 F (37 C)  SpO2: 97%   Filed Weights   10/02/20 0916  Weight: 128 lb 9 oz (58.3 kg)    GENERAL:alert, no distress and comfortable SKIN: No rash.  Palms with hyperpigmentation EYES: sclera clear LUNGS: clear with normal breathing effort HEART: regular rate & rhythm, no lower extremity edema ABDOMEN:abdomen soft,  non-tender and normal bowel sounds NEURO: alert & oriented x 3 with fluent speech.  Steady gait with cane PAC without erythema  LABORATORY DATA:  I have reviewed Donna data as listed CBC Latest Ref Rng & Units 10/02/2020 09/18/2020 09/04/2020  WBC 4.0 - 10.5 K/uL 5.2 4.5 5.8  Hemoglobin 12.0 - 15.0 g/dL 9.5(L) 9.7(L) 9.6(L)  Hematocrit 36.0 - 46.0 % 29.0(L) 29.7(L) 29.4(L)  Platelets 150 - 400 K/uL 168 163 191     CMP Latest Ref Rng & Units 10/02/2020 09/18/2020 09/04/2020  Glucose 70 - 99 mg/dL 121(H) 92 87  BUN 8 - 23 mg/dL 31(H) 37(H) 36(H)  Creatinine 0.44 - 1.00 mg/dL 1.50(H) 1.42(H) 1.70(H)  Sodium 135 - 145 mmol/L 142 141 142  Potassium 3.5 - 5.1 mmol/L 4.4 4.4 4.6  Chloride 98 - 111 mmol/L 110 109 110  CO2 22 - 32 mmol/L _0 Calcium 8.9 - 10.3 mg/dL 9.2 9.2 9.4  Total Protein 6.5 - 8.1 g/dL 7.2 7.2 7.1  Total Bilirubin 0.3 - 1.2 mg/dL 0.2(L) 0.3 0.3  Alkaline Phos 38 - 126 U/L 58 63 56  AST 15 - 41 U/L _1 ALT 0 - 44 U/L _2 RADIOGRAPHIC STUDIES: I have personally reviewed Donna radiological images as listed and agreed with Donna findings in Donna report. No results found.   ASSESSMENT & PLAN:  Donna May is a 80 y.o. female with   1. Intrahepatic cholangiocarcinoma, cT1N0M1, with peritoneal metastasis, MSS, IDH1 mutation (+) -Diagnosed in 09/2018 biopsy of her liver mass shows adenocarcinoma, most consistent with cholangiocarcinoma. 09/29/18 PET scan showed no evidence of distant metastasis. -Unfortunately she was found to have peritoneal metastasis to right diaphragm and surgery was aborted. -Her CT AP from 11/29/18 shows dominate liver mass stable to mild increase, mild nodularity of both adrenal glands which warrants being monitored.  No visible peritoneal metastasis on CT scan. -FO results showed MSI stable disease, IDH mutation positive.  Consider IDH 1 inhibitor in Donna future.  Not a candidate for immunotherapy -She began first line oxaliplatin (due to  CKD) and gemcitabine every 2 weeks on 11/30/2018   -she responded very well to treatment and tolerated without significant toxicities except progressive neuropathy. Oxali was dose reduced and eventually stopped after cycle 11. She received single agent gemcitabine with cycle 12. Cisplatin added with C13 on 05/17/19 but neuropathy worsened and stopped on 06/14/19. -She has been receiving single agent gemcitabine every 2 weeks starting from 06/28/19 C16, tolerating well. She took chemo breast from 10/21 - 1/22 -PET from 03/14/2020 showed concern for new liver lesion, she restarted maintenance gemcitabine every 2 weeks from 04/03/2020.  -06/11/2020 PET showed stable disease -09/26/2020 PET showed recurrent  hypermetabolic activity in Donna dominant liver mass, consistent with recurrent disease -Per new study data Dr. Burr Medico has recommended to add durvalumab with gemcitabine, plan to start low dose with first cycle. Due to insurance/drug coverage this is delayed   2.  Grade 3 CIPN -secondary to Oxaliplatin, eventually dc'd after cycle 11 -started cisplatin on 05/17/19, neuropathy worsened in her feet with unsteady gait and functional limitations, decreased vibratory sense on tuning fork exam 05/31/19. Cisplatin was dose reduced then eventually stopped on 06/14/19.  -low efficacy of gabapentin and cymbalta, also tried lyrica briefly.  She completed PT for balance. Ambulates with cane to avoid fall -She has functional difficulties and ambulates with a cane -Follow-up open with Dr. Mickeal Skinner, pt did not find helpful. Also seen by chiropractor -Continues to have decreased function, on gabapentin 100 mg as needed -She went to chronic conditions center and was offered a type of treatment that would be $6000, she declined -In eligible for [facility] [place] 2102 neuropathy clinical trial   3. H/o right breast cancer, Genetics negative  -s/p mastectomy in 1992, per May did not require adjuvant therapy -previously followed by Dr.  Jana Hakim  -VUS of gene APC; genetics otherwise normal   4. Comorbidities: DM, HTN, hiatal hernia, GERD, renal artery stenosis, CHF -Follow-up with PCP, nephrology, and Dr. Dwyane Dee, and cardiology -stable, well controlled    5. Family support -she has a son and daughter who are supportive. -May remains independent    6. Goal of care discussion -We frequently discuss Donna goal of her treatment is palliative and that her cancer is not curable with chemo alone at this stage. Donna goal is to control her disease, prolong her life, and give her good quality of life. She understands -she is full code now   7. CKD Stage III -continue monitoring Scr 1.4 - 1.7  Disposition:  Donna May appears stable. She continues q2 week single agent gemcitabine. She tolerates well with mild constipation and persistent neuropathy.  Constipation is well managed with supportive care at home.  She is able to recover and function well.  Per her request, I reviewed her PET scan images with Donna May.  We discussed Dr. Ernestina Penna recommendation to add immunotherapy durvalumab to gemcitabine.  I reviewed Donna potential benefit, side effects, symptom management, and palliative goal of therapy.  She agrees to proceed.  Due to insurance/off label use/coverage, this is delayed.  Labs reviewed, adequate to proceed with gemcitabine alone today, plan to add Durvalumab (10 mg/kg with cycle 1) in 2 weeks.  All questions were answered. Donna May knows to call Donna clinic with any problems, questions or concerns. No barriers to learning were detected.  Total encounter time is 30 minutes.      Alla Feeling, NP 10/02/20

## 2020-10-02 ENCOUNTER — Encounter: Payer: Self-pay | Admitting: Nurse Practitioner

## 2020-10-02 ENCOUNTER — Other Ambulatory Visit: Payer: Medicare Other

## 2020-10-02 ENCOUNTER — Ambulatory Visit: Payer: Medicare Other

## 2020-10-02 ENCOUNTER — Inpatient Hospital Stay (HOSPITAL_BASED_OUTPATIENT_CLINIC_OR_DEPARTMENT_OTHER): Payer: Medicare Other | Admitting: Nurse Practitioner

## 2020-10-02 ENCOUNTER — Inpatient Hospital Stay: Payer: Medicare Other

## 2020-10-02 ENCOUNTER — Other Ambulatory Visit: Payer: Self-pay

## 2020-10-02 ENCOUNTER — Other Ambulatory Visit: Payer: Self-pay | Admitting: Hematology

## 2020-10-02 VITALS — BP 178/63 | HR 58 | Temp 98.6°F | Resp 18 | Wt 128.6 lb

## 2020-10-02 VITALS — BP 161/58

## 2020-10-02 DIAGNOSIS — Z5111 Encounter for antineoplastic chemotherapy: Secondary | ICD-10-CM | POA: Diagnosis not present

## 2020-10-02 DIAGNOSIS — D6481 Anemia due to antineoplastic chemotherapy: Secondary | ICD-10-CM | POA: Diagnosis not present

## 2020-10-02 DIAGNOSIS — D649 Anemia, unspecified: Secondary | ICD-10-CM

## 2020-10-02 DIAGNOSIS — C221 Intrahepatic bile duct carcinoma: Secondary | ICD-10-CM

## 2020-10-02 DIAGNOSIS — Z95828 Presence of other vascular implants and grafts: Secondary | ICD-10-CM

## 2020-10-02 DIAGNOSIS — Z79899 Other long term (current) drug therapy: Secondary | ICD-10-CM | POA: Diagnosis not present

## 2020-10-02 LAB — CMP (CANCER CENTER ONLY)
ALT: 8 U/L (ref 0–44)
AST: 19 U/L (ref 15–41)
Albumin: 3.5 g/dL (ref 3.5–5.0)
Alkaline Phosphatase: 58 U/L (ref 38–126)
Anion gap: 7 (ref 5–15)
BUN: 31 mg/dL — ABNORMAL HIGH (ref 8–23)
CO2: 25 mmol/L (ref 22–32)
Calcium: 9.2 mg/dL (ref 8.9–10.3)
Chloride: 110 mmol/L (ref 98–111)
Creatinine: 1.5 mg/dL — ABNORMAL HIGH (ref 0.44–1.00)
GFR, Estimated: 35 mL/min — ABNORMAL LOW (ref >60)
Glucose, Bld: 121 mg/dL — ABNORMAL HIGH (ref 70–99)
Potassium: 4.4 mmol/L (ref 3.5–5.1)
Sodium: 142 mmol/L (ref 135–145)
Total Bilirubin: 0.2 mg/dL — ABNORMAL LOW (ref 0.3–1.2)
Total Protein: 7.2 g/dL (ref 6.5–8.1)

## 2020-10-02 LAB — CBC WITH DIFFERENTIAL (CANCER CENTER ONLY)
Abs Immature Granulocytes: 0.01 10*3/uL (ref 0.00–0.07)
Basophils Absolute: 0 10*3/uL (ref 0.0–0.1)
Basophils Relative: 1 %
Eosinophils Absolute: 0.3 10*3/uL (ref 0.0–0.5)
Eosinophils Relative: 5 %
HCT: 29 % — ABNORMAL LOW (ref 36.0–46.0)
Hemoglobin: 9.5 g/dL — ABNORMAL LOW (ref 12.0–15.0)
Immature Granulocytes: 0 %
Lymphocytes Relative: 25 %
Lymphs Abs: 1.3 10*3/uL (ref 0.7–4.0)
MCH: 33 pg (ref 26.0–34.0)
MCHC: 32.8 g/dL (ref 30.0–36.0)
MCV: 100.7 fL — ABNORMAL HIGH (ref 80.0–100.0)
Monocytes Absolute: 1 10*3/uL (ref 0.1–1.0)
Monocytes Relative: 19 %
Neutro Abs: 2.6 10*3/uL (ref 1.7–7.7)
Neutrophils Relative %: 50 %
Platelet Count: 168 10*3/uL (ref 150–400)
RBC: 2.88 MIL/uL — ABNORMAL LOW (ref 3.87–5.11)
RDW: 15.8 % — ABNORMAL HIGH (ref 11.5–15.5)
WBC Count: 5.2 10*3/uL (ref 4.0–10.5)
nRBC: 0 % (ref 0.0–0.2)

## 2020-10-02 LAB — TSH: TSH: 2.065 u[IU]/mL (ref 0.308–3.960)

## 2020-10-02 LAB — SAMPLE TO BLOOD BANK

## 2020-10-02 MED ORDER — SODIUM CHLORIDE 0.9% FLUSH
10.0000 mL | Freq: Once | INTRAVENOUS | Status: AC
  Administered 2020-10-02: 10 mL
  Filled 2020-10-02: qty 10

## 2020-10-02 MED ORDER — PROCHLORPERAZINE MALEATE 10 MG PO TABS
ORAL_TABLET | ORAL | Status: AC
  Filled 2020-10-02: qty 1

## 2020-10-02 MED ORDER — SODIUM CHLORIDE 0.9 % IV SOLN
600.0000 mg/m2 | Freq: Once | INTRAVENOUS | Status: AC
  Administered 2020-10-02: 988 mg via INTRAVENOUS
  Filled 2020-10-02: qty 25.98

## 2020-10-02 MED ORDER — SODIUM CHLORIDE 0.9 % IV SOLN: 600.0000 mg/m2 | Freq: Once | INTRAVENOUS | Status: DC

## 2020-10-02 MED ORDER — SODIUM CHLORIDE 0.9% FLUSH
10.0000 mL | INTRAVENOUS | Status: DC | PRN
  Administered 2020-10-02: 10 mL
  Filled 2020-10-02: qty 10

## 2020-10-02 MED ORDER — HEPARIN SOD (PORK) LOCK FLUSH 100 UNIT/ML IV SOLN
500.0000 [IU] | Freq: Once | INTRAVENOUS | Status: AC | PRN
  Administered 2020-10-02: 500 [IU]
  Filled 2020-10-02: qty 5

## 2020-10-02 MED ORDER — SODIUM CHLORIDE 0.9 % IV SOLN
Freq: Once | INTRAVENOUS | Status: AC
  Filled 2020-10-02: qty 250

## 2020-10-02 MED ORDER — PROCHLORPERAZINE MALEATE 10 MG PO TABS
10.0000 mg | ORAL_TABLET | Freq: Once | ORAL | Status: AC
  Administered 2020-10-02: 10 mg via ORAL

## 2020-10-02 NOTE — Patient Instructions (Signed)
[facility]  Discharge Instructions: Thank you for choosing [facility] to provide your oncology and hematology care.   If you have a lab appointment with the Cancer Center, please go directly to the Cancer Center and check in at the registration area.   Wear comfortable clothing and clothing appropriate for easy access to any Portacath or PICC line.   We strive to give you quality time with your provider. You may need to reschedule your appointment if you arrive late (15 or more minutes).  Arriving late affects you and other patients whose appointments are after yours.  Also, if you miss three or more appointments without notifying the office, you may be dismissed from the clinic at the provider's discretion.      For prescription refill requests, have your pharmacy contact our office and allow 72 hours for refills to be completed.    Today you received the following chemotherapy and/or immunotherapy agents Gemzar    To help prevent nausea and vomiting after your treatment, we encourage you to take your nausea medication as directed.  BELOW ARE SYMPTOMS THAT SHOULD BE REPORTED IMMEDIATELY: *FEVER GREATER THAN 100.4 F (38 C) OR HIGHER *CHILLS OR SWEATING *NAUSEA AND VOMITING THAT IS NOT CONTROLLED WITH YOUR NAUSEA MEDICATION *UNUSUAL SHORTNESS OF BREATH *UNUSUAL BRUISING OR BLEEDING *URINARY PROBLEMS (pain or burning when urinating, or frequent urination) *BOWEL PROBLEMS (unusual diarrhea, constipation, pain near the anus) TENDERNESS IN MOUTH AND THROAT WITH OR WITHOUT PRESENCE OF ULCERS (sore throat, sores in mouth, or a toothache) UNUSUAL RASH, SWELLING OR PAIN  UNUSUAL VAGINAL DISCHARGE OR ITCHING   Items with * indicate a potential emergency and should be followed up as soon as possible or go to the Emergency Department if any problems should occur.  Please show the CHEMOTHERAPY ALERT CARD or IMMUNOTHERAPY ALERT CARD at check-in to the  Emergency Department and triage nurse.  Should you have questions after your visit or need to cancel or reschedule your appointment, please contact [facility]  Dept: [phone]  and follow the prompts.  Office hours are 8:00 a.m. to 4:30 p.m. Monday - Friday. Please note that voicemails left after 4:00 p.m. may not be returned until the following business day.  We are closed weekends and major holidays. You have access to a nurse at all times for urgent questions. Please call the main number to the clinic Dept: [phone] and follow the prompts.   For any non-urgent questions, you may also contact your provider using MyChart. We now offer e-Visits for anyone 18 and older to request care online for non-urgent symptoms. For details visit [email or web address].   Also download the MyChart app! Go to the app store, search "MyChart", open the app, select [facility], and log in with your MyChart username and password.  Due to Covid, a mask is required upon entering the hospital/clinic. If you do not have a mask, one will be given to you upon arrival. For doctor visits, patients may have 1 support person aged 18 or older with them. For treatment visits, patients cannot have anyone with them due to current Covid guidelines and our immunocompromised population.   Gemcitabine injection What is this medication? GEMCITABINE (jem SYE ta been) is a chemotherapy drug. This medicine is used to treat many types of cancer like breast cancer, lung cancer, pancreatic cancer,and ovarian cancer. This medicine may be used for other purposes; ask your health care provider orpharmacist if you have   questions. COMMON BRAND NAME(S): Gemzar, Infugem What should I tell my care team before I take this medication? They need to know if you have any of these conditions: blood disorders infection kidney disease liver disease lung or breathing disease, like asthma recent or ongoing radiation  therapy an unusual or allergic reaction to gemcitabine, other chemotherapy, other medicines, foods, dyes, or preservatives pregnant or trying to get pregnant breast-feeding How should I use this medication? This drug is given as an infusion into a vein. It is administered in a hospitalor clinic by a specially trained health care professional. Talk to your pediatrician regarding the use of this medicine in children.Special care may be needed. Overdosage: If you think you have taken too much of this medicine contact apoison control center or emergency room at once. NOTE: This medicine is only for you. Do not share this medicine with others. What if I miss a dose? It is important not to miss your dose. Call your doctor or health careprofessional if you are unable to keep an appointment. What may interact with this medication? medicines to increase blood counts like filgrastim, pegfilgrastim, sargramostim some other chemotherapy drugs like cisplatin vaccines Talk to your doctor or health care professional before taking any of thesemedicines: acetaminophen aspirin ibuprofen ketoprofen naproxen This list may not describe all possible interactions. Give your health care provider a list of all the medicines, herbs, non-prescription drugs, or dietary supplements you use. Also tell them if you smoke, drink alcohol, or use illegaldrugs. Some items may interact with your medicine. What should I watch for while using this medication? Visit your doctor for checks on your progress. This drug may make you feel generally unwell. This is not uncommon, as chemotherapy can affect healthy cells as well as cancer cells. Report any side effects. Continue your course oftreatment even though you feel ill unless your doctor tells you to stop. In some cases, you may be given additional medicines to help with side effects.Follow all directions for their use. Call your doctor or health care professional for advice if  you get a fever, chills or sore throat, or other symptoms of a cold or flu. Do not treat yourself. This drug decreases your body's ability to fight infections. Try toavoid being around people who are sick. This medicine may increase your risk to bruise or bleed. Call your doctor orhealth care professional if you notice any unusual bleeding. Be careful brushing and flossing your teeth or using a toothpick because you may get an infection or bleed more easily. If you have any dental work done,tell your dentist you are receiving this medicine. Avoid taking products that contain aspirin, acetaminophen, ibuprofen, naproxen, or ketoprofen unless instructed by your doctor. These medicines may hide afever. Do not become pregnant while taking this medicine or for 6 months after stopping it. Women should inform their doctor if they wish to become pregnant or think they might be pregnant. Men should not father a child while taking this medicine and for 3 months after stopping it. There is a potential for serious side effects to an unborn child. Talk to your health care professional or pharmacist for more information. Do not breast-feed an infant while takingthis medicine or for at least 1 week after stopping it. Men should inform their doctors if they wish to father a child. This medicine may lower sperm counts. Talk with your doctor or health care professional ifyou are concerned about your fertility. What side effects may I notice from receiving this medication?   Side effects that you should report to your doctor or health care professionalas soon as possible: allergic reactions like skin rash, itching or hives, swelling of the face, lips, or tongue breathing problems pain, redness, or irritation at site where injected signs and symptoms of a dangerous change in heartbeat or heart rhythm like chest pain; dizziness; fast or irregular heartbeat; palpitations; feeling faint or lightheaded, falls; breathing  problems signs of decreased platelets or bleeding - bruising, pinpoint red spots on the skin, black, tarry stools, blood in the urine signs of decreased red blood cells - unusually weak or tired, feeling faint or lightheaded, falls signs of infection - fever or chills, cough, sore throat, pain or difficulty passing urine signs and symptoms of kidney injury like trouble passing urine or change in the amount of urine signs and symptoms of liver injury like dark yellow or brown urine; general ill feeling or flu-like symptoms; light-colored stools; loss of appetite; nausea; right upper belly pain; unusually weak or tired; yellowing of the eyes or skin swelling of ankles, feet, hands Side effects that usually do not require medical attention (report to yourdoctor or health care professional if they continue or are bothersome): constipation diarrhea hair loss loss of appetite nausea rash vomiting This list may not describe all possible side effects. Call your doctor for medical advice about side effects. You may report side effects to FDA at1-800-FDA-1088. Where should I keep my medication? This drug is given in a hospital or clinic and will not be stored at home. NOTE: This sheet is a summary. It may not cover all possible information. If you have questions about this medicine, talk to your doctor, pharmacist, orhealth care provider.  2022 [employer] (2017-05-26 18:06:11)   

## 2020-10-03 ENCOUNTER — Telehealth: Payer: Self-pay | Admitting: Hematology

## 2020-10-03 ENCOUNTER — Telehealth: Payer: Self-pay

## 2020-10-03 NOTE — Telephone Encounter (Signed)
Scheduled follow-up appointment per 7/20 los. Patient is aware. 

## 2020-10-03 NOTE — Telephone Encounter (Signed)
10/03/20 @ 430PM: Palliative care SW outreached patient to schedule in home visit with SW and RN.  Call unsuccessful. SW LVM. Awaiting return call.

## 2020-10-15 DIAGNOSIS — Z20822 Contact with and (suspected) exposure to covid-19: Secondary | ICD-10-CM | POA: Diagnosis not present

## 2020-10-16 ENCOUNTER — Inpatient Hospital Stay: Payer: Medicare Other | Attending: Adult Health

## 2020-10-16 ENCOUNTER — Inpatient Hospital Stay (HOSPITAL_BASED_OUTPATIENT_CLINIC_OR_DEPARTMENT_OTHER): Payer: Medicare Other | Admitting: Hematology

## 2020-10-16 ENCOUNTER — Other Ambulatory Visit: Payer: Self-pay

## 2020-10-16 ENCOUNTER — Inpatient Hospital Stay: Payer: Medicare Other

## 2020-10-16 ENCOUNTER — Telehealth: Payer: Self-pay | Admitting: Hematology

## 2020-10-16 ENCOUNTER — Encounter: Payer: Self-pay | Admitting: Hematology

## 2020-10-16 VITALS — BP 187/62 | HR 63 | Temp 98.2°F | Resp 18 | Ht 65.0 in | Wt 129.4 lb

## 2020-10-16 VITALS — BP 131/51

## 2020-10-16 DIAGNOSIS — G620 Drug-induced polyneuropathy: Secondary | ICD-10-CM

## 2020-10-16 DIAGNOSIS — Z5111 Encounter for antineoplastic chemotherapy: Secondary | ICD-10-CM | POA: Insufficient documentation

## 2020-10-16 DIAGNOSIS — C786 Secondary malignant neoplasm of retroperitoneum and peritoneum: Secondary | ICD-10-CM | POA: Diagnosis not present

## 2020-10-16 DIAGNOSIS — Z79899 Other long term (current) drug therapy: Secondary | ICD-10-CM | POA: Diagnosis not present

## 2020-10-16 DIAGNOSIS — Z5112 Encounter for antineoplastic immunotherapy: Secondary | ICD-10-CM | POA: Insufficient documentation

## 2020-10-16 DIAGNOSIS — C221 Intrahepatic bile duct carcinoma: Secondary | ICD-10-CM | POA: Diagnosis not present

## 2020-10-16 DIAGNOSIS — T451X5A Adverse effect of antineoplastic and immunosuppressive drugs, initial encounter: Secondary | ICD-10-CM | POA: Diagnosis not present

## 2020-10-16 DIAGNOSIS — Z95828 Presence of other vascular implants and grafts: Secondary | ICD-10-CM

## 2020-10-16 DIAGNOSIS — D630 Anemia in neoplastic disease: Secondary | ICD-10-CM

## 2020-10-16 LAB — CBC WITH DIFFERENTIAL (CANCER CENTER ONLY)
Abs Immature Granulocytes: 0.02 10*3/uL (ref 0.00–0.07)
Basophils Absolute: 0 10*3/uL (ref 0.0–0.1)
Basophils Relative: 0 %
Eosinophils Absolute: 0.4 10*3/uL (ref 0.0–0.5)
Eosinophils Relative: 6 %
HCT: 29.8 % — ABNORMAL LOW (ref 36.0–46.0)
Hemoglobin: 9.8 g/dL — ABNORMAL LOW (ref 12.0–15.0)
Immature Granulocytes: 0 %
Lymphocytes Relative: 19 %
Lymphs Abs: 1.1 10*3/uL (ref 0.7–4.0)
MCH: 32.9 pg (ref 26.0–34.0)
MCHC: 32.9 g/dL (ref 30.0–36.0)
MCV: 100 fL (ref 80.0–100.0)
Monocytes Absolute: 1 10*3/uL (ref 0.1–1.0)
Monocytes Relative: 17 %
Neutro Abs: 3.4 10*3/uL (ref 1.7–7.7)
Neutrophils Relative %: 58 %
Platelet Count: 154 10*3/uL (ref 150–400)
RBC: 2.98 MIL/uL — ABNORMAL LOW (ref 3.87–5.11)
RDW: 16 % — ABNORMAL HIGH (ref 11.5–15.5)
WBC Count: 6 10*3/uL (ref 4.0–10.5)
nRBC: 0 % (ref 0.0–0.2)

## 2020-10-16 LAB — SAMPLE TO BLOOD BANK

## 2020-10-16 LAB — CMP (CANCER CENTER ONLY)
ALT: 10 U/L (ref 0–44)
AST: 15 U/L (ref 15–41)
Albumin: 3.5 g/dL (ref 3.5–5.0)
Alkaline Phosphatase: 67 U/L (ref 38–126)
Anion gap: 8 (ref 5–15)
BUN: 35 mg/dL — ABNORMAL HIGH (ref 8–23)
CO2: 25 mmol/L (ref 22–32)
Calcium: 9.3 mg/dL (ref 8.9–10.3)
Chloride: 108 mmol/L (ref 98–111)
Creatinine: 1.79 mg/dL — ABNORMAL HIGH (ref 0.44–1.00)
GFR, Estimated: 28 mL/min — ABNORMAL LOW (ref >60)
Glucose, Bld: 98 mg/dL (ref 70–99)
Potassium: 4.4 mmol/L (ref 3.5–5.1)
Sodium: 141 mmol/L (ref 135–145)
Total Bilirubin: 0.3 mg/dL (ref 0.3–1.2)
Total Protein: 7.2 g/dL (ref 6.5–8.1)

## 2020-10-16 MED ORDER — SODIUM CHLORIDE 0.9 % IV SOLN
600.0000 mg/m2 | Freq: Once | INTRAVENOUS | Status: AC
  Administered 2020-10-16: 988 mg via INTRAVENOUS
  Filled 2020-10-16: qty 25.98

## 2020-10-16 MED ORDER — HEPARIN SOD (PORK) LOCK FLUSH 100 UNIT/ML IV SOLN
500.0000 [IU] | Freq: Once | INTRAVENOUS | Status: AC | PRN
  Administered 2020-10-16: 500 [IU]
  Filled 2020-10-16: qty 5

## 2020-10-16 MED ORDER — SODIUM CHLORIDE 0.9% FLUSH
10.0000 mL | INTRAVENOUS | Status: DC | PRN
Start: 2020-10-16 — End: 2020-10-16
  Administered 2020-10-16: 10 mL
  Filled 2020-10-16: qty 10

## 2020-10-16 MED ORDER — SODIUM CHLORIDE 0.9 % IV SOLN
Freq: Once | INTRAVENOUS | Status: AC
  Filled 2020-10-16: qty 250

## 2020-10-16 MED ORDER — SODIUM CHLORIDE 0.9% FLUSH
10.0000 mL | Freq: Once | INTRAVENOUS | Status: AC
  Administered 2020-10-16: 10 mL
  Filled 2020-10-16: qty 10

## 2020-10-16 MED ORDER — PROCHLORPERAZINE MALEATE 10 MG PO TABS
10.0000 mg | ORAL_TABLET | Freq: Once | ORAL | Status: AC
  Administered 2020-10-16: 10 mg via ORAL

## 2020-10-16 MED ORDER — PROCHLORPERAZINE MALEATE 10 MG PO TABS
ORAL_TABLET | ORAL | Status: AC
  Filled 2020-10-16: qty 1

## 2020-10-16 MED ORDER — SODIUM CHLORIDE 0.9 % IV SOLN
10.0000 mg/kg | Freq: Once | INTRAVENOUS | Status: AC
  Administered 2020-10-16: 620 mg via INTRAVENOUS
  Filled 2020-10-16: qty 10

## 2020-10-16 NOTE — Progress Notes (Addendum)
Per Dr. Burr Medico, "OK To Treat w/elevated SBP and Cr+1.79 (pt has hx of CKD)".

## 2020-10-16 NOTE — Progress Notes (Addendum)
[name]   Telephone:([phone]) [phone] Fax:(336) [phone]   Clinic Follow up Note   Patient Care Team: [name], MD as PCP - General (Family Medicine) [name], [name], MD as PCP - Cardiology (Cardiology) [name], [name], MD as Consulting Physician (Cardiology) [name], MD as Consulting Physician (Obstetrics and Gynecology) [name], [name], NP as Nurse Practitioner (Hematology and Oncology) [facility] [name], MD as Consulting Physician (Hematology) [name], [name], MD as Consulting Physician (Gastroenterology) Virgina Evener, Dawn, RN (Inactive) as Oncology Nurse Navigator [name], MD as Consulting Physician (General Surgery)  Date of Service:  10/16/2020  CHIEF COMPLAINT: f/u of cholangiocarcinoma  SUMMARY OF ONCOLOGIC HISTORY: Oncology History Overview Note  Cancer Staging Intrahepatic cholangiocarcinoma ([place]) Staging form: Intrahepatic Bile Duct, AJCC 8th Edition - Clinical stage from 09/23/2018: Stage IB (cT1b, cN0, cM0) - Signed by [name], MD on 10/06/2018    Intrahepatic cholangiocarcinoma ([place])  09/07/2018 Imaging   CT Chest IMPRESSION: 1. New, enhancing mass involving segment 4 of the liver and fundus of gallbladder is concerning for malignancy. This may represent either metastatic disease from breast cancer or neoplasm primary to the liver or hepatic biliary tree. Further evaluation with contrast enhanced CT of the abdomen and pelvis is recommended. 2. No findings to suggest metastatic disease within the chest. 3.  Aortic Atherosclerosis (ICD10-I70.0). 4. Coronary artery calcifications.   09/13/2018 Pathology Results   Diagnosis Liver, needle/core biopsy - ADENOCARCINOMA. Microscopic Comment Immunohistochemistry for CK7 is positive. CK20, TTF1, CDX-2, GATA-3, PAX 8, Qualitative ER, p63 and CK5/6 are negative. The provided clinical history of remote mammary carcinoma is noted. Based on the morphology  and immunophenotype of the adenocarcinoma observed in this specimen, primary cholangiocarcinoma is favored. Clinical and radiologic correlation are  encouraged. Results reported to [employer] on 09/15/2018. Intradepartmental consultation (Dr. Vic Ripper).   09/13/2018 Initial Diagnosis   Cholangiocarcinoma ([place])   09/23/2018 Procedure   Colonoscopy by Dr. Havery Moros 09/23/18  IMPRESSION - Two 3 to 4 mm polyps in the ascending colon, removed with a cold snare. Resected and retrieved. - Five 3 to 5 mm polyps in the transverse colon, removed with a cold snare. Resected and retrieved. - One 5 mm polyp at the splenic flexure, removed with a cold snare. Resected and retrieved. - Three 3 to 5 mm polyps in the sigmoid colon, removed with a cold snare. Resected and retrieved. - The examination was otherwise normal. Upper Endopscy by Dr. Havery Moros 09/23/18  IMPRESSION - Esophagogastric landmarks identified. - 2 cm hiatal hernia. - Normal esophagus otherwise. - A single gastric polyp. Resected and retrieved. - Mild gastritis. Biopsied. - Normal duodenal bulb and second portion of the duodenum.   09/23/2018 Pathology Results   Diagnosis 09/23/18 1. Surgical [P], duodenum - BENIGN SMALL BOWEL MUCOSA. - NO ACTIVE INFLAMMATION OR VILLOUS ATROPHY IDENTIFIED. 2. Surgical [P], stomach, polyp - HYPERPLASTIC POLYP(S). - THERE IS NO EVIDENCE OF MALIGNANCY. 3. Surgical [P], gastric antrum and gastric body - CHRONIC INACTIVE GASTRITIS. - THERE IS NO EVIDENCE OF HELICOBACTER-PYLORI, DYSPLASIA, OR MALIGNANCY. - SEE COMMENT. 4. Surgical [P], colon, sigmoid, splenic flexure, transverse and ascending, polyp (9) - TUBULAR ADENOMA(S). - SESSILE SERRATED POLYP WITHOUT CYTOLOGIC DYSPLASIA. - HIGH GRADE DYSPLASIA IS NOT IDENTIFIED. 5. Surgical [P], colon, sigmoid, polyp (2) - HYPERPLASTIC POLYP(S). - THERE IS NO EVIDENCE OF MALIGNANCY.   09/23/2018 Cancer Staging   Staging form: Intrahepatic Bile Duct, AJCC  8th Edition - Clinical stage from 09/23/2018: Stage IB (cT1b, cN0, cM0) - Signed  by [name], MD on 10/06/2018    09/29/2018 PET scan   PET 09/29/18 IMPRESSION: 1. Hypermetabolic mass in the RIGHT hepatic lobe consistent with biopsy proven adenocarcinoma. No additional liver metastasis. 2. No evidence of local breast cancer recurrence in the RIGHT breast or RIGHT axilla. 3. Mild bilateral hypermetabolic adrenal glands is favored benign hyperplasia. 4. No evidence of additional metastatic disease on skull base to thigh FDG PET scan.   11/06/2018 Genetic Testing   Negative genetic testing on the [facility] Common Hereditary Cancers panel. A variant of uncertain significance was identified in one of her APC genes, called c.1243G>A (p.Ala415Thr).  The Common Hereditary Cancers Panel offered by [facility] includes sequencing and/or deletion duplication testing of the following 48 genes: APC, ATM, AXIN2, BARD1, BMPR1A, BRCA1, BRCA2, BRIP1, CDH1, CDK4, CDKN2A (p14ARF), CDKN2A (p16INK4a), CHEK2, CTNNA1, DICER1, EPCAM (Deletion/duplication testing only), GREM1 (promoter region deletion/duplication testing only), KIT, MEN1, MLH1, MSH2, MSH3, MSH6, MUTYH, NBN, NF1, NHTL1, PALB2, PDGFRA, PMS2, POLD1, POLE, PTEN, RAD50, RAD51C, RAD51D, RNF43, SDHB, SDHC, SDHD, SMAD4, SMARCA4. STK11, TP53, TSC1, TSC2, and VHL.  The following genes were evaluated for sequence changes only: SDHA and HOXB13 c.251G>A variant only.    11/17/2018 Pathology Results   Diagnosis 11/17/18 1. Soft tissue, biopsy, Diaphragmatic nodules - METASTATIC ADENOCARCINOMA, CONSISTENT WITH PATIENT'S CLINICAL HISTORY OF CHOLANGIOCARCINOMA. SEE NOTE 2. Liver, biopsy, Left - LIVER PARENCHYMA WITH A BENIGN FIBROTIC NODULE - NO EVIDENCE OF MALIGNANCY 3. Stomach, biopsy - BENIGN PAPILLARY MESOTHELIAL HYPERPLASIA - NO EVIDENCE OF MALIGNANCY   11/29/2018 Imaging   CT CAP WO Contrast  IMPRESSION: 1. Dominant liver mass appears grossly stable from  09/29/2018. Additional liver lesions are too small to characterize but were not shown to be hypermetabolic on PET. 2. Mild nodularity of both adrenal glands with associated hypermetabolism on 09/29/2018. Continued attention on follow-up exams is warranted. 3. Small right lower lobe nodules, stable from 09/07/2018. Again, attention on follow-up is recommended. 4. Trace bilateral pleural fluid. 5. Aortic atherosclerosis (ICD10-170.0). Coronary artery calcification. 6. Enlarged pulmonic trunk, indicative of pulmonary arterial hypertension.     11/30/2018 - 06/14/2019 Chemotherapy   First line chemo Oxaliplatin and gemcitabine q2weeks starting 11/30/18. Stopped Oxaliplatin on 05/03/19 due to worsening neuropathy. Added Cisplatin with C13 on 05/17/19 and stopped on C15 06/14/19 due to neuropathy. Reduced to maintenance single agent Gemcitabine on 06/28/19.    01/20/2019 Imaging   CT CAP IMPRESSION: restaging  1. Mild interval increase in size of dominant liver mass involving segment 4 and segment 5. 2. No significant or progressive adrenal nodularity identified to suggest metastatic disease. 3. Unchanged appearance of small right lower lobe and lingular lung nodules, nonspecific.   03/21/2019 PET scan   IMPRESSION: 1. Large right hepatic mass with SUV uptake near background hepatic activity, dramatic response to therapy, also with decrease in size when compared to the prior study. 2. Signs of prior right mastectomy and axillary dissection as before. 3. Left adrenal activity in no longer above the level of activity seen in the contralateral, right adrenal gland or in the liver. Attention on follow-up. 4. No new signs of disease.   06/27/2019 PET scan   IMPRESSION: 1. Further decrease in size of a dominant hepatic mass which is non FDG avid. 2. No evidence of metastatic disease. 3. No evidence of left adrenal hypermetabolism or mass. 4. Incidental findings, including: Uterine fibroids.  Coronary artery atherosclerosis. Aortic Atherosclerosis (ICD10-I70.0). Pulmonary artery enlargement suggests pulmonary arterial hypertension.   06/28/2019 -  Chemotherapy   Maintenance  single agent Gemcitabine q2weeks starting on 06/28/19 with C16.  ------On chemo break since 12/27/19.  ------She restarted Single Agent Gemcitabine q2weeks on 04/03/20 due to mild disease progression in liver on 02/2020 PET    09/15/2019 PET scan   IMPRESSION: 1. In the region of regional tumor at the junction of the right and left hepatic lobe, there is continued hypodensity and overall activity slightly less than that of the surrounding normal liver, indicating effective response to therapy. No current worrisome hypermetabolic lesion is identified in the liver or elsewhere. 2. Chronic asymmetric edema in the subcutaneous tissues of the right upper extremity, nonspecific. The patient has had a prior right mastectomy and right axillary dissection. 3. Other imaging findings of potential clinical significance: Aortic Atherosclerosis (ICD10-I70.0). Coronary atherosclerosis. Mild cardiomegaly. Small right and trace left pleural effusions. Subcutaneous and mild mesenteric edema. Suspected uterine fibroids.   12/18/2019 PET scan   IMPRESSION: 1. No significant abnormal activity to suggest active/recurrent malignancy. 2. Lingual tonsillar activity is mildly increased from prior but probably incidental/physiologic, attention on follow up suggested. 3. Other imaging findings of potential clinical significance: Third spacing of fluid with trace ascites, mesenteric edema, subcutaneous edema, and possibly subtle pulmonary edema. Mild cardiomegaly. Aortic Atherosclerosis (ICD10-I70.0). Coronary atherosclerosis. Right ovarian dermoid. Calcified uterine fibroids. Renal cysts. Right glenohumeral arthropathy. Mild to moderate scoliosis. Suspected pulmonary arterial hypertension.   03/14/2020 PET scan   IMPRESSION: 1.  New small focus of hypermetabolism in segment 4A of the liver worrisome for new metastatic focus. 2. No other findings for metastatic disease involving the neck, chest, abdomen or pelvis.     06/11/2020 PET scan   IMPRESSION: 1. No suspicious hypermetabolic activity within the neck, chest, abdomen or pelvis. 2. The treated dominant peripheral cholangiocarcinoma appears slightly enlarged compared with recent prior studies, and there is a questionable new lesion more superiorly in the left lobe. Although without associated hypermetabolic activity, these lesions are suspicious and would be better assessed with abdominal MRI without and with contrast.   09/17/2020 PET scan   IMPRESSION: 1. Recurrent hypermetabolic activity around the periphery of the dominant hepatic mass consistent with local recurrence. This mass appears slightly larger. Possible small metastasis in the dome of the left hepatic lobe is unchanged in size, without hypermetabolic activity. 2. No distant metastases identified. 3. Stable incidental findings as detailed above.   10/09/2020 -  Chemotherapy    Patient is on Treatment Plan: PANCREAS GEM/CIS Q14D   Patient is on Antibody Plan: BLADDER DURVALUMAB Q14D        CURRENT THERAPY:  1. Maintenance single agent Gemcitabine q2weeks starting on 06/28/19 with C16. On chemo break since 12/27/19. She restarted Single Agent Gemcitabine q2weeks on 04/03/20 due to mild disease progression. 2. Adding imfinzi due to mild progression on 09/26/20 PET scan, delayed due to insurance approval   INTERVAL HISTORY:  PHENIX GREIN is here for a follow up of cholangio. She was last seen by me on 08/21/20, and by NP Lacie and PA Cassie in the interim. She presents to the clinic alone. She reports her neuropathy continues to get worse. She notes the cold makes it worse. She denies any recent falls. She notes she does as much as she can at home. She denies stomach pain but notes some discomfort with  constipation. She also notes an occasional pain in her lower right abdomen. She reports her appetite is good.   All other systems were reviewed with the patient and are negative.  MEDICAL HISTORY:  Past Medical History:  Diagnosis Date   Anemia    Anxiety    Arthritis    Breast cancer (HCC) 1992   right   Cataract    Bilateral eyes - surgery to remove   Chronic diastolic CHF (congestive heart failure) (HCC)    Chronic kidney disease    CKD stage 3   Complication of anesthesia    "something they use make me itch for a couple of days."   Depression    Diabetes mellitus    type 2   Duodenitis 01/18/2002   Fainting spell    GERD (gastroesophageal reflux disease)    Heart murmur    never has caused any problems   Hiatal hernia 08/08/2008, 01/18/2002   History of pneumonia    x 2   Hyperlipidemia    Hypertension    Liver cancer (HCC) 08/2018   chemotherapy   Lymphedema 2017   Right arm   Peripheral neuropathy     SURGICAL HISTORY: Past Surgical History:  Procedure Laterality Date   AXILLARY SURGERY     cyst removal, right   COLONOSCOPY  09/23/2018   Dr. Adela Lank - polyps   EYE SURGERY Bilateral    cataracts to remove   LAPAROSCOPY N/A 11/17/2018   Procedure: LAPAROSCOPY DIAGNOSTIC, INTRAOPERATIVE ULTRASOUND, PERITONEAL BIOPSIES;  Surgeon: Almond Lint, MD;  Location: [facility] OR;  Service: General;  Laterality: N/A;  GENERAL AND EPIDURAL   LIVER BIOPSY  08/2018   Dr. Rip Harbour   MASTECTOMY  1992   right, with flap   NM MYOCAR PERF WALL MOTION  06/11/2009   Protocol:[place]ruce, post stress EF58%, EKG negative for ischemia, low risk   PORTACATH PLACEMENT N/A 12/02/2018   Procedure: INSERTION PORT-A-CATH;  Surgeon: Almond Lint, MD;  Location: [facility] OR;  Service: General;  Laterality: N/A;   RECONSTRUCTION BREAST W/ TRAM FLAP Right    TONSILLECTOMY  1958   TRANSTHORACIC ECHOCARDIOGRAM  12/24/2009   LVEF =>55%, normal study   UPPER GI ENDOSCOPY      I have reviewed the social  history and family history with the patient and they are unchanged from previous note.  ALLERGIES:  has No Known Allergies.  MEDICATIONS:  Current Outpatient Medications  Medication Sig Dispense Refill   amLODipine (NORVASC) 10 MG tablet Take 1 tablet (10 mg total) by mouth daily. 90 tablet 3   aspirin 81 MG tablet Take 81 mg by mouth daily.      carvedilol (COREG) 25 MG tablet Take 1 tablet (25 mg total) by mouth 2 (two) times daily with a meal. 180 tablet 3   furosemide (LASIX) 40 MG tablet Take 1 tablet (40 mg total) by mouth daily. 30 tablet 11   gabapentin (NEURONTIN) 100 MG capsule Take 1 capsule (100 mg total) by mouth every 8 (eight) hours as needed. 180 capsule 2   glucose blood test strip 1 each by Other route 2 (two) times daily. Use Onetouch verio test strips as instructed to check blood sugar twice daily. 100 each 2   hydrALAZINE (APRESOLINE) 100 MG tablet TAKE THREE TIMES DAILY-AM, MIDDLE OF THE DAY AND IN THE EVENING. 270 tablet 3   KLOR-CON M10 10 MEQ tablet TAKE 1 TABLET BY MOUTH EVERY DAY 90 tablet 1   lansoprazole (PREVACID) 15 MG capsule Take 1 capsule (15 mg total) by mouth daily. Please keep your upcoming appointment for further refills. 90 capsule 0   lidocaine-prilocaine (EMLA) cream Apply 1 application topically as needed. 30 g 1  loratadine (CLARITIN) 10 MG tablet Take 10 mg by mouth daily as needed for allergies.     Omega 3 1000 MG CAPS Take 2,000 mg by mouth daily.      sitaGLIPtin (JANUVIA) 100 MG tablet Take 1 tablet (100 mg total) by mouth daily. 90 tablet 3   valsartan (DIOVAN) 320 MG tablet Take 1 tablet (320 mg total) by mouth daily. 90 tablet 3   Current Facility-Administered Medications  Medication Dose Route Frequency Provider Last Rate Last Admin   triamcinolone acetonide (KENALOG) 10 MG/ML injection 10 mg  10 mg Other Once Harriet Masson, DPM        PHYSICAL EXAMINATION: ECOG PERFORMANCE STATUS: 2 - Symptomatic, <50% confined to bed  Vitals:    10/16/20 0913  BP: (!) 187/62  Pulse: 63  Resp: 18  Temp: 98.2 F (36.8 C)  SpO2: 100%   Wt Readings from Last 3 Encounters:  10/16/20 129 lb 6.4 oz (58.7 kg)  10/02/20 128 lb 9 oz (58.3 kg)  09/20/20 126 lb 6.4 oz (57.3 kg)    GENERAL:alert, no distress and comfortable SKIN: skin color, texture, turgor are normal, no rashes or significant lesions EYES: normal, Conjunctiva are pink and non-injected, sclera clear  NECK: supple, thyroid normal size, non-tender, without nodularity LYMPH:  no palpable lymphadenopathy in the cervical, axillary  LUNGS: clear to auscultation and percussion with normal breathing effort HEART: regular rate & rhythm and no murmurs and no lower extremity edema ABDOMEN:abdomen soft, non-tender and normal bowel sounds Musculoskeletal:no cyanosis of digits and no clubbing  NEURO: alert & oriented x 3 with fluent speech, no focal motor/sensory deficits  LABORATORY DATA:  I have reviewed the data as listed CBC Latest Ref Rng & Units 10/16/2020 10/02/2020 09/18/2020  WBC 4.0 - 10.5 K/uL 6.0 5.2 4.5  Hemoglobin 12.0 - 15.0 g/dL 9.8(L) 9.5(L) 9.7(L)  Hematocrit 36.0 - 46.0 % 29.8(L) 29.0(L) 29.7(L)  Platelets 150 - 400 K/uL 154 168 163     CMP Latest Ref Rng & Units 10/16/2020 10/02/2020 09/18/2020  Glucose 70 - 99 mg/dL 98 121(H) 92  BUN 8 - 23 mg/dL 35(H) 31(H) 37(H)  Creatinine 0.44 - 1.00 mg/dL 1.79(H) 1.50(H) 1.42(H)  Sodium 135 - 145 mmol/L 141 142 141  Potassium 3.5 - 5.1 mmol/L 4.4 4.4 4.4  Chloride 98 - 111 mmol/L 108 110 109  CO2 22 - 32 mmol/L $RemoveB'25 25 24  'avIYpoYK$ Calcium 8.9 - 10.3 mg/dL 9.3 9.2 9.2  Total Protein 6.5 - 8.1 g/dL 7.2 7.2 7.2  Total Bilirubin 0.3 - 1.2 mg/dL 0.3 0.2(L) 0.3  Alkaline Phos 38 - 126 U/L 67 58 63  AST 15 - 41 U/L $Remo'15 19 20  'ukrxc$ ALT 0 - 44 U/L $Remo'10 8 8      'bJPVD$ RADIOGRAPHIC STUDIES: I have personally reviewed the radiological images as listed and agreed with the findings in the report. No results found.   ASSESSMENT & PLAN:  Donna May  is a 80 y.o. female with   1. Intrahepatic cholangiocarcinoma, cT1N0M1, with peritoneal metastasis, MSS, IDH1 mutation (+) -Diagnosed in 09/2018 biopsy of her liver mass shows adenocarcinoma, most consistent with cholangiocarcinoma. 09/29/18 PET scan showed no evidence of distant metastasis. -Unfortunately she was found to have peritoneal metastasis to right diaphragm and surgery was aborted. -Her CT AP from 11/29/18 shows dominate liver mass stable to mild increase, mild nodularity of both adrenal glands which warrants being monitored.  No visible peritoneal metastasis on CT scan. -FO results showed MSI stable disease,  IDH mutation positive.  Consider IDH 1 inhibitor in the future.  Not a candidate for immunotherapy -She began first line oxaliplatin (due to CKD) and gemcitabine every 2 weeks on 11/30/2018   -she responded very well to treatment and tolerated without significant toxicities except progressive neuropathy. Oxali was dose reduced and eventually stopped after cycle 11. She received single agent gemcitabine with cycle 12. Cisplatin added with C13 on 05/17/19 but neuropathy worsened and stopped on 06/14/19. -She has been receiving single agent gemcitabine every 2 weeks starting from 06/28/19 C16, tolerating well. She took chemo break from 10/21 - 1/22 -PET from 03/14/2020 showed concern for disease progression in liver, she restarted maintenance gemcitabine every 2 weeks from 04/03/2020. -09/26/2020 PET showed recurrent hypermetabolic activity in the dominant liver mass, consistent with recurrent disease -I recommended to add durvalumab to gemcitabine, plan to start low dose with first cycle. Due to insurance/drug coverage this is delayed -I discussed the possibility of referring her to IR for consideration of liver targeted therapy such as Y90 radioembolization. I also discussed the option of switching to Xeloda. Given her neuropathy, my recommendation is for Y90 if we are not able to give Durvalumab  She is agreeable.  Lab reviewed, will proceed with gemcitabine today    2.  Grade 3 CIPN -secondary to Oxaliplatin, eventually dc'd after cycle 11 -started cisplatin on 05/17/19, neuropathy worsened in her feet with unsteady gait and functional limitations, decreased vibratory sense on tuning fork exam 05/31/19. Cisplatin was dose reduced then eventually stopped on 06/14/19.  -low efficacy of gabapentin and cymbalta, also tried lyrica briefly.  She completed PT for balance. Ambulates with cane to avoid fall -She has functional difficulties and ambulates with a cane -Follow-up open with Dr. Mickeal Skinner, pt did not find helpful. Also seen by chiropractor -Continues to have decreased function, on gabapentin 100 mg as needed -She went to chronic conditions center and was offered a type of treatment that would be $6000, she declined -In eligible for [facility] [place] 2102 neuropathy clinical trial   3. H/o right breast cancer, Genetics negative  -s/p mastectomy in 1992, per patient did not require adjuvant therapy -previously followed by Dr. Jana Hakim  -VUS of gene APC; genetics otherwise normal   4. Comorbidities: DM, HTN, hiatal hernia, GERD, renal artery stenosis, CHF -Follow-up with PCP, nephrology, and Dr. Dwyane Dee, and cardiology -stable, well controlled -Her BP was elevated today. She endorses taking her medication as directed. She notes it is related to the stress of being here.   5. Family support -she has a son and daughter who are supportive. -patient remains independent    6. Goal of care discussion -We frequently discuss the goal of her treatment is palliative and that her cancer is not curable with chemo alone at this stage. The goal is to control her disease, prolong her life, and give her good quality of life. She understands -she is full code now    7. CKD Stage III -continue monitoring Scr 1.4 - 1.7   PLAN: -proceed with gemcitabine today and in 2 and 4 weeks -after contacting her  secondary insurance company, we decided to start Durvalumab today since it will likely be covered by either her primary or secondary insurance  -will hold on referral to IR for consideration of Y90 for now  -labs and f/u in 4 weeks. -next durvalumab in 4 weeks     No problem-specific Assessment & Plan notes found for this encounter.   No orders of the defined  types were placed in this encounter.  All questions were answered. The patient knows to call the clinic with any problems, questions or concerns. No barriers to learning was detected. The total time spent in the appointment was 30 minutes.     [name], MD 10/16/2020   I, Wilburn Mylar, am acting as scribe for [name], MD.   I have reviewed the above documentation for accuracy and completeness, and I agree with the above.

## 2020-10-16 NOTE — Progress Notes (Signed)
Ok to proceed w/ Imfinzi today per Dr. Burr Medico. Per [employer], no auth req'd regardless of whether or not Medicare pays. If either Medicare or [employer] do not pay, [place] [employer] [place] will assist w/ denial per Juan Quam (pt assistance)  Kennith Center, Pharm.D., CPP 10/16/2020@11 :08 AM

## 2020-10-16 NOTE — Patient Instructions (Addendum)
[facility]  Discharge Instructions: Thank you for choosing [facility] to provide your oncology and hematology care.   If you have a lab appointment with the [facility], please go directly to the [facility] and check in at the registration area.   Wear comfortable clothing and clothing appropriate for easy access to any Portacath or PICC line.   We strive to give you quality time with your provider. You may need to reschedule your appointment if you arrive late (15 or more minutes).  Arriving late affects you and other patients whose appointments are after yours.  Also, if you miss three or more appointments without notifying the office, you may be dismissed from the clinic at the provider's discretion.      For prescription refill requests, have your pharmacy contact our office and allow 72 hours for refills to be completed.    Today you received the following chemotherapy and/or immunotherapy agents Gemcitabine and Durvalumab.      To help prevent nausea and vomiting after your treatment, we encourage you to take your nausea medication as directed.  BELOW ARE SYMPTOMS THAT SHOULD BE REPORTED IMMEDIATELY: *FEVER GREATER THAN 100.4 F (38 C) OR HIGHER *CHILLS OR SWEATING *NAUSEA AND VOMITING THAT IS NOT CONTROLLED WITH YOUR NAUSEA MEDICATION *UNUSUAL SHORTNESS OF BREATH *UNUSUAL BRUISING OR BLEEDING *URINARY PROBLEMS (pain or burning when urinating, or frequent urination) *BOWEL PROBLEMS (unusual diarrhea, constipation, pain near the anus) TENDERNESS IN MOUTH AND THROAT WITH OR WITHOUT PRESENCE OF ULCERS (sore throat, sores in mouth, or a toothache) UNUSUAL RASH, SWELLING OR PAIN  UNUSUAL VAGINAL DISCHARGE OR ITCHING   Items with * indicate a potential emergency and should be followed up as soon as possible or go to the Emergency Department if any problems should occur.  Please show the CHEMOTHERAPY ALERT CARD or IMMUNOTHERAPY ALERT  CARD at check-in to the Emergency Department and triage nurse.  Should you have questions after your visit or need to cancel or reschedule your appointment, please contact [facility]  Dept: [phone]  and follow the prompts.  Office hours are 8:00 a.m. to 4:30 p.m. Monday - Friday. Please note that voicemails left after 4:00 p.m. may not be returned until the following business day.  We are closed weekends and major holidays. You have access to a nurse at all times for urgent questions. Please call the main number to the clinic Dept: [phone] and follow the prompts.   For any non-urgent questions, you may also contact your provider using MyChart. We now offer e-Visits for anyone 54 and older to request care online for non-urgent symptoms. For details visit [email or web address].   Also download the MyChart app! Go to the app store, search "MyChart", open the app, select [facility], and log in with your MyChart username and password.  Due to Covid, a mask is required upon entering the hospital/clinic. If you do not have a mask, one will be given to you upon arrival. For doctor visits, patients may have 1 support person aged 41 or older with them. For treatment visits, patients cannot have anyone with them due to current Covid guidelines and our immunocompromised population.   Durvalumab injection What is this medication? DURVALUMAB (dur VAL ue mab) is a monoclonal antibody. It is used to treat lungcancer. This medicine may be used for other purposes; ask your health care provider orpharmacist if you have questions. COMMON BRAND NAME(S): [email or web address] What should I tell  my care team before I take this medication? They need to know if you have any of these conditions: autoimmune diseases like Crohn's disease, ulcerative colitis, or lupus have had or planning to have an allogeneic stem cell transplant (uses someone else's stem cells) history of organ  transplant history of radiation to the chest nervous system problems like myasthenia gravis or Guillain-Barre syndrome an unusual or allergic reaction to durvalumab, other medicines, foods, dyes, or preservatives pregnant or trying to get pregnant breast-feeding How should I use this medication? This medicine is for infusion into a vein. It is given by a health careprofessional in a hospital or clinic setting. A special MedGuide will be given to you before each treatment. Be sure to readthis information carefully each time. Talk to your pediatrician regarding the use of this medicine in children.Special care may be needed. Overdosage: If you think you have taken too much of this medicine contact apoison control center or emergency room at once. NOTE: This medicine is only for you. Do not share this medicine with others. What if I miss a dose? It is important not to miss your dose. Call your doctor or health careprofessional if you are unable to keep an appointment. What may interact with this medication? Interactions have not been studied. This list may not describe all possible interactions. Give your health care provider a list of all the medicines, herbs, non-prescription drugs, or dietary supplements you use. Also tell them if you smoke, drink alcohol, or use illegaldrugs. Some items may interact with your medicine. What should I watch for while using this medication? This drug may make you feel generally unwell. Continue your course of treatmenteven though you feel ill unless your doctor tells you to stop. You may need blood work done while you are taking this medicine. Do not become pregnant while taking this medicine or for 3 months after stopping it. Women should inform their doctor if they wish to become pregnant or think they might be pregnant. There is a potential for serious side effects to an unborn child. Talk to your health care professional or pharmacist for more information. Do  not breast-feed an infant while taking this medicine orfor 3 months after stopping it. What side effects may I notice from receiving this medication? Side effects that you should report to your doctor or health care professionalas soon as possible: allergic reactions like skin rash, itching or hives, swelling of the face, lips, or tongue black, tarry stools bloody or watery diarrhea breathing problems change in emotions or moods change in sex drive changes in vision chest pain or chest tightness chills confusion cough facial flushing fever headache signs and symptoms of high blood sugar such as dizziness; dry mouth; dry skin; fruity breath; nausea; stomach pain; increased hunger or thirst; increased urination signs and symptoms of liver injury like dark yellow or brown urine; general ill feeling or flu-like symptoms; light-colored stools; loss of appetite; nausea; right upper belly pain; unusually weak or tired; yellowing of the eyes or skin stomach pain trouble passing urine or change in the amount of urine weight gain or weight loss Side effects that usually do not require medical attention (report these toyour doctor or health care professional if they continue or are bothersome): bone pain constipation loss of appetite muscle pain nausea swelling of the ankles, feet, hands tiredness This list may not describe all possible side effects. Call your doctor for medical advice about side effects. You may report side effects to FDA at1-800-FDA-1088.  Where should I keep my medication? This drug is given in a hospital or clinic and will not be stored at home. NOTE: This sheet is a summary. It may not cover all possible information. If you have questions about this medicine, talk to your doctor, pharmacist, orhealth care provider.  2022 Elsevier/Gold Standard (2019-05-11 13:01:29)

## 2020-10-16 NOTE — Telephone Encounter (Signed)
Cancelled upcoming MD visit per 8/3 los. Patient is aware of changes.

## 2020-10-23 ENCOUNTER — Other Ambulatory Visit: Payer: Self-pay | Admitting: Hematology

## 2020-10-23 ENCOUNTER — Telehealth: Payer: Self-pay

## 2020-10-23 NOTE — Telephone Encounter (Signed)
115 pm.  Phone call made to patient to schedule a home visit.  Patient is agreeable to next Friday at 11 am.

## 2020-10-30 ENCOUNTER — Ambulatory Visit: Payer: Medicare Other | Admitting: Hematology

## 2020-10-30 ENCOUNTER — Inpatient Hospital Stay: Payer: Medicare Other

## 2020-10-30 ENCOUNTER — Other Ambulatory Visit: Payer: Self-pay

## 2020-10-30 VITALS — BP 169/64 | HR 71 | Temp 99.0°F | Resp 18 | Wt 127.4 lb

## 2020-10-30 DIAGNOSIS — C221 Intrahepatic bile duct carcinoma: Secondary | ICD-10-CM

## 2020-10-30 DIAGNOSIS — C786 Secondary malignant neoplasm of retroperitoneum and peritoneum: Secondary | ICD-10-CM | POA: Diagnosis not present

## 2020-10-30 DIAGNOSIS — Z79899 Other long term (current) drug therapy: Secondary | ICD-10-CM | POA: Diagnosis not present

## 2020-10-30 DIAGNOSIS — Z5112 Encounter for antineoplastic immunotherapy: Secondary | ICD-10-CM | POA: Diagnosis not present

## 2020-10-30 DIAGNOSIS — Z5111 Encounter for antineoplastic chemotherapy: Secondary | ICD-10-CM | POA: Diagnosis not present

## 2020-10-30 DIAGNOSIS — Z95828 Presence of other vascular implants and grafts: Secondary | ICD-10-CM

## 2020-10-30 LAB — CMP (CANCER CENTER ONLY)
ALT: 10 U/L (ref 0–44)
AST: 16 U/L (ref 15–41)
Albumin: 3.4 g/dL — ABNORMAL LOW (ref 3.5–5.0)
Alkaline Phosphatase: 69 U/L (ref 38–126)
Anion gap: 10 (ref 5–15)
BUN: 24 mg/dL — ABNORMAL HIGH (ref 8–23)
CO2: 25 mmol/L (ref 22–32)
Calcium: 9.2 mg/dL (ref 8.9–10.3)
Chloride: 107 mmol/L (ref 98–111)
Creatinine: 1.44 mg/dL — ABNORMAL HIGH (ref 0.44–1.00)
GFR, Estimated: 37 mL/min — ABNORMAL LOW (ref >60)
Glucose, Bld: 73 mg/dL (ref 70–99)
Potassium: 4.6 mmol/L (ref 3.5–5.1)
Sodium: 142 mmol/L (ref 135–145)
Total Bilirubin: 0.3 mg/dL (ref 0.3–1.2)
Total Protein: 7.5 g/dL (ref 6.5–8.1)

## 2020-10-30 LAB — CBC WITH DIFFERENTIAL (CANCER CENTER ONLY)
Abs Immature Granulocytes: 0.02 10*3/uL (ref 0.00–0.07)
Basophils Absolute: 0 10*3/uL (ref 0.0–0.1)
Basophils Relative: 0 %
Eosinophils Absolute: 0.5 10*3/uL (ref 0.0–0.5)
Eosinophils Relative: 8 %
HCT: 29.2 % — ABNORMAL LOW (ref 36.0–46.0)
Hemoglobin: 9.7 g/dL — ABNORMAL LOW (ref 12.0–15.0)
Immature Granulocytes: 0 %
Lymphocytes Relative: 16 %
Lymphs Abs: 1.1 10*3/uL (ref 0.7–4.0)
MCH: 32.4 pg (ref 26.0–34.0)
MCHC: 33.2 g/dL (ref 30.0–36.0)
MCV: 97.7 fL (ref 80.0–100.0)
Monocytes Absolute: 1.3 10*3/uL — ABNORMAL HIGH (ref 0.1–1.0)
Monocytes Relative: 18 %
Neutro Abs: 4.1 10*3/uL (ref 1.7–7.7)
Neutrophils Relative %: 58 %
Platelet Count: 211 10*3/uL (ref 150–400)
RBC: 2.99 MIL/uL — ABNORMAL LOW (ref 3.87–5.11)
RDW: 15.8 % — ABNORMAL HIGH (ref 11.5–15.5)
WBC Count: 7.1 10*3/uL (ref 4.0–10.5)
nRBC: 0 % (ref 0.0–0.2)

## 2020-10-30 MED ORDER — MAGNESIUM SULFATE 2 GM/50ML IV SOLN
INTRAVENOUS | Status: AC
  Filled 2020-10-30: qty 50

## 2020-10-30 MED ORDER — SODIUM CHLORIDE 0.9 % IV SOLN
1500.0000 mg | Freq: Once | INTRAVENOUS | Status: AC
  Administered 2020-10-30: 1500 mg via INTRAVENOUS
  Filled 2020-10-30: qty 30

## 2020-10-30 MED ORDER — PALONOSETRON HCL INJECTION 0.25 MG/5ML
INTRAVENOUS | Status: AC
  Filled 2020-10-30: qty 5

## 2020-10-30 MED ORDER — SODIUM CHLORIDE 0.9 % IV SOLN: Freq: Once | INTRAVENOUS | Status: AC

## 2020-10-30 MED ORDER — PROCHLORPERAZINE MALEATE 10 MG PO TABS
ORAL_TABLET | ORAL | Status: AC
  Filled 2020-10-30: qty 1

## 2020-10-30 MED ORDER — SODIUM CHLORIDE 0.9 % IV SOLN
600.0000 mg/m2 | Freq: Once | INTRAVENOUS | Status: AC
  Administered 2020-10-30: 988 mg via INTRAVENOUS
  Filled 2020-10-30: qty 25.98

## 2020-10-30 MED ORDER — HEPARIN SOD (PORK) LOCK FLUSH 100 UNIT/ML IV SOLN
500.0000 [IU] | Freq: Once | INTRAVENOUS | Status: AC | PRN
  Administered 2020-10-30: 500 [IU]

## 2020-10-30 MED ORDER — PROCHLORPERAZINE MALEATE 10 MG PO TABS
10.0000 mg | ORAL_TABLET | Freq: Once | ORAL | Status: AC
  Administered 2020-10-30: 10 mg via ORAL

## 2020-10-30 MED ORDER — SODIUM CHLORIDE 0.9% FLUSH
10.0000 mL | Freq: Once | INTRAVENOUS | Status: AC
  Administered 2020-10-30: 10 mL

## 2020-10-30 MED ORDER — SODIUM CHLORIDE 0.9% FLUSH
10.0000 mL | INTRAVENOUS | Status: DC | PRN
  Administered 2020-10-30: 10 mL

## 2020-10-30 NOTE — Progress Notes (Signed)
Ok to proceed with Durvalumab 1500mg  today. Will begin 1500mg  Q 4 weeks.   Raul [place], [place], BCPS, BCOP 10/30/2020 9:19 AM

## 2020-10-30 NOTE — Patient Instructions (Signed)
[facility] ONCOLOGY  Discharge Instructions: Thank you for choosing [facility] to provide your oncology and hematology care.   If you have a lab appointment with the [facility], please go directly to the [facility] and check in at the registration area.   Wear comfortable clothing and clothing appropriate for easy access to any Portacath or PICC line.   We strive to give you quality time with your provider. You may need to reschedule your appointment if you arrive late (15 or more minutes).  Arriving late affects you and other patients whose appointments are after yours.  Also, if you miss three or more appointments without notifying the office, you may be dismissed from the clinic at the provider's discretion.      For prescription refill requests, have your pharmacy contact our office and allow 72 hours for refills to be completed.    Today you received the following chemotherapy and/or immunotherapy agents Gemcitabine and Durvalumab      To help prevent nausea and vomiting after your treatment, we encourage you to take your nausea medication as directed.  BELOW ARE SYMPTOMS THAT SHOULD BE REPORTED IMMEDIATELY: *FEVER GREATER THAN 100.4 F (38 C) OR HIGHER *CHILLS OR SWEATING *NAUSEA AND VOMITING THAT IS NOT CONTROLLED WITH YOUR NAUSEA MEDICATION *UNUSUAL SHORTNESS OF BREATH *UNUSUAL BRUISING OR BLEEDING *URINARY PROBLEMS (pain or burning when urinating, or frequent urination) *BOWEL PROBLEMS (unusual diarrhea, constipation, pain near the anus) TENDERNESS IN MOUTH AND THROAT WITH OR WITHOUT PRESENCE OF ULCERS (sore throat, sores in mouth, or a toothache) UNUSUAL RASH, SWELLING OR PAIN  UNUSUAL VAGINAL DISCHARGE OR ITCHING   Items with * indicate a potential emergency and should be followed up as soon as possible or go to the Emergency Department if any problems should occur.  Please show the CHEMOTHERAPY ALERT CARD or IMMUNOTHERAPY ALERT CARD  at check-in to the Emergency Department and triage nurse.  Should you have questions after your visit or need to cancel or reschedule your appointment, please contact [facility]  Dept: [phone]  and follow the prompts.  Office hours are 8:00 a.m. to 4:30 p.m. Monday - Friday. Please note that voicemails left after 4:00 p.m. may not be returned until the following business day.  We are closed weekends and major holidays. You have access to a nurse at all times for urgent questions. Please call the main number to the clinic Dept: [phone] and follow the prompts.   For any non-urgent questions, you may also contact your provider using MyChart. We now offer e-Visits for anyone 7 and older to request care online for non-urgent symptoms. For details visit [email or web address].   Also download the MyChart app! Go to the app store, search "MyChart", open the app, select [facility], and log in with your MyChart username and password.  Due to Covid, a mask is required upon entering the hospital/clinic. If you do not have a mask, one will be given to you upon arrival. For doctor visits, patients may have 1 support person aged 82 or older with them. For treatment visits, patients cannot have anyone with them due to current Covid guidelines and our immunocompromised population.

## 2020-11-01 ENCOUNTER — Ambulatory Visit (INDEPENDENT_AMBULATORY_CARE_PROVIDER_SITE_OTHER): Payer: Medicare Other | Admitting: Gastroenterology

## 2020-11-01 ENCOUNTER — Encounter: Payer: Self-pay | Admitting: Gastroenterology

## 2020-11-01 VITALS — BP 150/50 | HR 64 | Ht 64.0 in | Wt 127.1 lb

## 2020-11-01 DIAGNOSIS — C221 Intrahepatic bile duct carcinoma: Secondary | ICD-10-CM

## 2020-11-01 DIAGNOSIS — I248 Other forms of acute ischemic heart disease: Secondary | ICD-10-CM | POA: Diagnosis not present

## 2020-11-01 DIAGNOSIS — K219 Gastro-esophageal reflux disease without esophagitis: Secondary | ICD-10-CM

## 2020-11-01 DIAGNOSIS — K5900 Constipation, unspecified: Secondary | ICD-10-CM | POA: Diagnosis not present

## 2020-11-01 MED ORDER — LANSOPRAZOLE 15 MG PO CPDR
15.0000 mg | DELAYED_RELEASE_CAPSULE | Freq: Every day | ORAL | 3 refills | Status: DC
Start: 2020-11-01 — End: 2020-11-01

## 2020-11-01 MED ORDER — POLYETHYLENE GLYCOL 3350 17 G PO PACK: 17.0000 g | PACK | Freq: Every day | ORAL | 0 refills | Status: AC | PRN

## 2020-11-01 MED ORDER — ONDANSETRON 4 MG PO TBDP: 4.0000 mg | ORAL_TABLET | Freq: Three times a day (TID) | ORAL | 1 refills | Status: DC | PRN

## 2020-11-01 MED ORDER — LANSOPRAZOLE 15 MG PO CPDR: 15.0000 mg | DELAYED_RELEASE_CAPSULE | Freq: Every day | ORAL | 3 refills | Status: AC

## 2020-11-01 NOTE — Progress Notes (Signed)
HPI :  80 year old female here for a follow-up visit for GERD.  Recall that she has history of intrahepatic cholangiocarcinoma diagnosed in 2020.  She has been followed with Dr. Annamaria Boots of the oncology service for this issue.  She has a history of metastatic disease, has been treated with chemotherapy.  She has had some recurrent disease noted on PET scan in July.  She has been on a regimen of gemcitabine and added Durvalumab recently.  The plan is to consider IR consult for Y 90 if she progresses on the regimen.  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  She has a history of reflux, small hiatal hernia noted on EGD in 2020, no evidence of Barrett's.  She is been using Prevacid 15mg  daily as needed since that time.  She states she may take it a few days a week at most, typically does not take it routinely, symptoms are mild and intermittent.  No dysphagia or alarm symptoms.  She does endorse rare epigastric discomfort that is relieved with drinking cold water.  This is not frequent. She does endorse some constipation.  She has a bowel movement daily but has hard stools and sometimes strains.  Not taking anything for her bowels.  We discussed options.    Endoscopic history: Colonoscopy 2010 - normal EGD May 2010 - 4-5 cm hiatal hernia   EGD 09/23/18 -  - A 2 cm hiatal hernia was present. - The exam of the esophagus was otherwise normal. - A single 4 mm sessile polyp was found in the gastric body. The polyp was removed with a cold biopsy forceps. Resection and retrieval were complete. - Patchy mild inflammation characterized by erythema and friability was found in the gastric body and in the gastric antrum. Biopsies were taken with a cold forceps for Helicobacter pylori testing. - The exam of the stomach was otherwise normal. - The duodenal bulb and second portion of the duodenum were normal.  Colonoscopy 09/23/18 - The  perianal and digital rectal examinations were normal. - Two sessile polyps were found in the ascending colon. The polyps were 3 to 4 mm in size. These polyps were removed with a cold snare. Resection and retrieval were complete. - Five sessile polyps were found in the transverse colon. The polyps were 3 to 5 mm in size. These polyps were removed with a cold snare. Resection and retrieval were complete. - A 5 mm polyp was found in the splenic flexure. The polyp was sessile. The polyp was removed with a cold snare. Resection and retrieval were complete. - Three sessile polyps were found in the sigmoid colon. The polyps were 3 to 5 mm in size. These polyps were removed with a cold snare. Resection and retrieval were complete. - The exam was otherwise without abnormality.  1. Surgical [P], duodenum - BENIGN SMALL BOWEL MUCOSA. - NO ACTIVE INFLAMMATION OR VILLOUS ATROPHY IDENTIFIED. 2. Surgical [P], stomach, polyp - HYPERPLASTIC POLYP(S). - THERE IS NO EVIDENCE OF MALIGNANCY. 3. Surgical [P], gastric antrum and gastric body - CHRONIC INACTIVE GASTRITIS. - THERE IS NO EVIDENCE OF HELICOBACTER-PYLORI, DYSPLASIA, OR MALIGNANCY. - SEE COMMENT. 4. Surgical [P], colon, sigmoid, splenic flexure, transverse and ascending, polyp (9) - TUBULAR ADENOMA(S). - SESSILE SERRATED POLYP WITHOUT CYTOLOGIC DYSPLASIA. - HIGH GRADE DYSPLASIA IS NOT IDENTIFIED. 5. Surgical [P], colon, sigmoid, polyp (2) - HYPERPLASTIC POLYP(S). - THERE IS NO EVIDENCE OF MALIGNANCY.   PET scan 09/17/20 -  IMPRESSION: 1. Recurrent hypermetabolic activity around the periphery of the dominant hepatic mass consistent with local recurrence. This mass appears slightly larger. Possible small metastasis in the dome of the left hepatic lobe is unchanged in size, without hypermetabolic activity. 2. No distant metastases identified. 3. Stable incidental findings as detailed above.   Past Medical History:  Diagnosis Date   Anemia     Anxiety    Arthritis    Breast cancer ([place]) 1992   right   Cataract    Bilateral eyes - surgery to remove   Chronic diastolic CHF (congestive heart failure) (HCC)    Chronic kidney disease    CKD stage 3   Complication of anesthesia    "something they use make me itch for a couple of days."   Depression    Diabetes mellitus    type 2   Duodenitis 01/18/2002   Fainting spell    GERD (gastroesophageal reflux disease)    Heart murmur    never has caused any problems   Hiatal hernia 08/08/2008, 01/18/2002   History of pneumonia    x 2   Hyperlipidemia    Hypertension    Liver cancer ([place] [place]) 08/2018   chemotherapy   Lymphedema 2017   Right arm   Peripheral neuropathy      Past Surgical History:  Procedure Laterality Date   AXILLARY SURGERY     cyst removal, right   COLONOSCOPY  09/23/2018   Dr. Havery Moros - polyps   EYE SURGERY Bilateral    cataracts to remove   LAPAROSCOPY N/A 11/17/2018   Procedure: LAPAROSCOPY DIAGNOSTIC, INTRAOPERATIVE ULTRASOUND, PERITONEAL BIOPSIES;  Surgeon: [name], MD;  Location: [facility];  Service: General;  Laterality: N/A;  GENERAL AND EPIDURAL   LIVER BIOPSY  08/2018   Dr. Lindwood Coke   MASTECTOMY  1992   right, with flap   NM [place]  06/11/2009   Protocol:[place], post stress EF58%, EKG negative for ischemia, low risk   PORTACATH PLACEMENT N/A 12/02/2018   Procedure: INSERTION PORT-A-CATH;  Surgeon: [name], MD;  Location: [facility];  Service: General;  Laterality: N/A;   RECONSTRUCTION BREAST W/ TRAM FLAP Right    TONSILLECTOMY  1958   TRANSTHORACIC ECHOCARDIOGRAM  12/24/2009   LVEF =>55%, normal study   UPPER GI ENDOSCOPY     Family History  Problem Relation Age of Onset   Breast cancer Cousin        diagnosed >50; mother's first cousins   Diabetes Brother    Heart disease Brother    Heart disease Mother    Hypertension Mother    Multiple sclerosis Mother    Heart disease Father    Hypertension Father    Stroke  Father    Prostate cancer Brother 65   Heart attack Maternal Grandmother    Stroke Maternal Grandfather    Colon cancer Neg Hx    Esophageal cancer Neg Hx    Stomach cancer Neg Hx    Rectal cancer Neg Hx    Social History   Tobacco Use   Smoking status: Former    Packs/day: 0.25    Years: 25.00    Pack years: 6.25    Types: Cigarettes    Quit date: 1990    Years since quitting: 32.6   Smokeless tobacco: Never  Vaping Use   Vaping Use: Never used  Substance Use Topics   Alcohol use: Not Currently    Alcohol/week: 0.0 standard drinks   Drug use: Never  Current Outpatient Medications  Medication Sig Dispense Refill   amLODipine (NORVASC) 10 MG tablet Take 1 tablet (10 mg total) by mouth daily. 90 tablet 3   aspirin 81 MG tablet Take 81 mg by mouth daily.      carvedilol (COREG) 25 MG tablet Take 1 tablet (25 mg total) by mouth 2 (two) times daily with a meal. 180 tablet 3   furosemide (LASIX) 40 MG tablet Take 1 tablet (40 mg total) by mouth daily. 30 tablet 11   gabapentin (NEURONTIN) 100 MG capsule Take 1 capsule (100 mg total) by mouth every 8 (eight) hours as needed. 180 capsule 2   glucose blood test strip 1 each by Other route 2 (two) times daily. Use Onetouch verio test strips as instructed to check blood sugar twice daily. 100 each 2   hydrALAZINE (APRESOLINE) 100 MG tablet TAKE THREE TIMES DAILY-AM, MIDDLE OF THE DAY AND IN THE EVENING. 270 tablet 3   KLOR-CON M10 10 MEQ tablet TAKE 1 TABLET BY MOUTH EVERY DAY 90 tablet 1   lansoprazole (PREVACID) 15 MG capsule Take 1 capsule (15 mg total) by mouth daily. Please keep your upcoming appointment for further refills. 90 capsule 0   lidocaine-prilocaine (EMLA) cream Apply 1 application topically as needed. 30 g 1   loratadine (CLARITIN) 10 MG tablet Take 10 mg by mouth daily as needed for allergies.     Omega 3 1000 MG CAPS Take 2,000 mg by mouth daily.      sitaGLIPtin (JANUVIA) 100 MG tablet Take 1 tablet (100 mg total)  by mouth daily. 90 tablet 3   valsartan (DIOVAN) 320 MG tablet Take 1 tablet (320 mg total) by mouth daily. 90 tablet 3   Current Facility-Administered Medications  Medication Dose Route Frequency Provider Last Rate Last Admin   triamcinolone acetonide (KENALOG) 10 MG/ML injection 10 mg  10 mg Other Once Harriet Masson, DPM       No Known Allergies   Review of Systems: All systems reviewed and negative except where noted in HPI.   Lab Results  Component Value Date   WBC 7.1 10/30/2020   HGB 9.7 (L) 10/30/2020   HCT 29.2 (L) 10/30/2020   MCV 97.7 10/30/2020   PLT 211 10/30/2020    Lab Results  Component Value Date   CREATININE 1.44 (H) 10/30/2020   BUN 24 (H) 10/30/2020   NA 142 10/30/2020   K 4.6 10/30/2020   CL 107 10/30/2020   CO2 25 10/30/2020    Lab Results  Component Value Date   ALT 10 10/30/2020   AST 16 10/30/2020   ALKPHOS 69 10/30/2020   BILITOT 0.3 10/30/2020      Physical Exam: BP (!) 150/50 (BP Location: Left Arm, Patient Position: Sitting, Cuff Size: Normal)   Pulse 64 Comment: irregular  Ht 5\' 4"  (1.626 m) Comment: height measured without shoes  Wt 127 lb 2 oz (57.7 kg)   LMP  (LMP Unknown)   BMI 21.82 kg/m  Constitutional: Pleasant,well-developed, female in no acute distress. Skin: Skin is warm and dry. No rashes noted. Psychiatric: Normal mood and affect. Behavior is normal.   ASSESSMENT AND PLAN: 80 year old female here for reassessment following:  GERD Constipation Intrahepatic cholangiocarcinoma  Following with oncology for metastatic/recurrent intrahepatic cholangiocarcinoma.  Currently on chemotherapy, hopefully she has a good response to the change in her regimen.  Other than neuropathy she appears to be tolerating it fairly well.  Her reflux seems fairly well controlled on Prevacid as  needed.  I will refill this for her if she can take it as needed or daily if needed.  I will provide her some Zofran to take if she has upset  stomach around the time of her chemotherapy.  We discussed options for constipation which seems quite mild.  Gave her some free samples of MiraLAX to take as needed and hopefully that will help.  If she needs anything else from our end she should contact me.  I do not think she needs a surveillance colonoscopy for her history of polyps given her other medical problems and her age.  She agrees with the plan, all questions answered  Jolly Mango, MD [facility]

## 2020-11-01 NOTE — Patient Instructions (Addendum)
If you are age 82 or older, your body mass index should be between 23-30. Your Body mass index is 21.82 kg/m. If this is out of the aforementioned range listed, please consider follow up with your Primary Care Provider.  If you are age 37 or younger, your body mass index should be between 19-25. Your Body mass index is 21.82 kg/m. If this is out of the aformentioned range listed, please consider follow up with your Primary Care Provider.   __________________________________________________________  The [facility] providers would like to encourage you to use [facility] to communicate with providers for non-urgent requests or questions.  Due to long hold times on the telephone, sending your provider a message by [facility] may be a faster and more efficient way to get a response.  Please allow 48 business hours for a response.  Please remember that this is for non-urgent requests.    We have sent the following medications to your pharmacy for you to pick up at your convenience:  Prevacid  Zofran 4 mg SL: Take every 8 hours as needed  We have given you samples of the following medication to take: Miralax - take as directed  Thank you for entrusting me with your care and for choosing [facility], Dr. [place]

## 2020-11-12 NOTE — Progress Notes (Signed)
Donna May   [phone] [phone]   Clinic Follow up Note   Patient Care Team: [name], MD as PCP - General (Family Medicine) [name], [name], MD as PCP - Cardiology (Cardiology) [name], [name], MD as Consulting Physician (Cardiology) [name], MD as Consulting Physician (Obstetrics and Gynecology) [name], [name], NP as Nurse Practitioner (Hematology and Oncology) [facility] [name], MD as Consulting Physician (Hematology) [name], [name], MD as Consulting Physician (Gastroenterology) [name], RN (Inactive) as Oncology Nurse Navigator [name], MD as Consulting Physician (General Surgery) 11/13/2020  CHIEF COMPLAINT: Follow-up intrahepatic cholangiocarcinoma  SUMMARY OF ONCOLOGIC HISTORY: Oncology History Overview Note  Cancer Staging Intrahepatic cholangiocarcinoma ([place]) Staging form: Intrahepatic Bile Duct, AJCC 8th Edition - Clinical stage from 09/23/2018: Stage IB (cT1b, cN0, cM0) - Signed by [name], MD on 10/06/2018    Intrahepatic cholangiocarcinoma ([place])  09/07/2018 Imaging   CT Chest IMPRESSION: 1. New, enhancing mass involving segment 4 of the liver and fundus of gallbladder is concerning for malignancy. This may represent either metastatic disease from breast cancer or neoplasm primary to the liver or hepatic biliary tree. Further evaluation with contrast enhanced CT of the abdomen and pelvis is recommended. 2. No findings to suggest metastatic disease within the chest. 3.  Aortic Atherosclerosis (ICD10-I70.0). 4. Coronary artery calcifications.   09/13/2018 Pathology Results   Diagnosis Liver, needle/core biopsy - ADENOCARCINOMA. Microscopic Comment Immunohistochemistry for CK7 is positive. CK20, TTF1, CDX-2, GATA-3, PAX 8, Qualitative ER, p63 and CK5/6 are negative. The provided clinical history of remote mammary carcinoma is noted. Based on the morphology and  immunophenotype of the adenocarcinoma observed in this specimen, primary cholangiocarcinoma is favored. Clinical and radiologic correlation are  encouraged. Results reported to [employer] on 09/15/2018. Intradepartmental consultation (Dr. Vic Ripper).   09/13/2018 Initial Diagnosis   Cholangiocarcinoma ([place])   09/23/2018 Procedure   Colonoscopy by Dr. Havery Moros 09/23/18  IMPRESSION - Two 3 to 4 mm polyps in the ascending colon, removed with a cold snare. Resected and retrieved. - Five 3 to 5 mm polyps in the transverse colon, removed with a cold snare. Resected and retrieved. - One 5 mm polyp at the splenic flexure, removed with a cold snare. Resected and retrieved. - Three 3 to 5 mm polyps in the sigmoid colon, removed with a cold snare. Resected and retrieved. - The examination was otherwise normal. Upper Endopscy by Dr. Havery Moros 09/23/18  IMPRESSION - Esophagogastric landmarks identified. - 2 cm hiatal hernia. - Normal esophagus otherwise. - A single gastric polyp. Resected and retrieved. - Mild gastritis. Biopsied. - Normal duodenal bulb and second portion of the duodenum.   09/23/2018 Pathology Results   Diagnosis 09/23/18 1. Surgical [P], duodenum - BENIGN SMALL BOWEL MUCOSA. - NO ACTIVE INFLAMMATION OR VILLOUS ATROPHY IDENTIFIED. 2. Surgical [P], stomach, polyp - HYPERPLASTIC POLYP(S). - THERE IS NO EVIDENCE OF MALIGNANCY. 3. Surgical [P], gastric antrum and gastric body - CHRONIC INACTIVE GASTRITIS. - THERE IS NO EVIDENCE OF HELICOBACTER-PYLORI, DYSPLASIA, OR MALIGNANCY. - SEE COMMENT. 4. Surgical [P], colon, sigmoid, splenic flexure, transverse and ascending, polyp (9) - TUBULAR ADENOMA(S). - SESSILE SERRATED POLYP WITHOUT CYTOLOGIC DYSPLASIA. - HIGH GRADE DYSPLASIA IS NOT IDENTIFIED. 5. Surgical [P], colon, sigmoid, polyp (2) - HYPERPLASTIC POLYP(S). - THERE IS NO EVIDENCE OF MALIGNANCY.   09/23/2018 Cancer Staging   Staging form: Intrahepatic Bile Duct, AJCC 8th  Edition - Clinical stage from 09/23/2018: Stage IB (cT1b, cN0, cM0) - Signed by [name], MD on  10/06/2018   09/29/2018 PET scan   PET 09/29/18 IMPRESSION: 1. Hypermetabolic mass in the RIGHT hepatic lobe consistent with biopsy proven adenocarcinoma. No additional liver metastasis. 2. No evidence of local breast cancer recurrence in the RIGHT breast or RIGHT axilla. 3. Mild bilateral hypermetabolic adrenal glands is favored benign hyperplasia. 4. No evidence of additional metastatic disease on skull base to thigh FDG PET scan.   11/06/2018 Genetic Testing   Negative genetic testing on the [facility] Common Hereditary Cancers panel. A variant of uncertain significance was identified in one of her APC genes, called c.1243G>A (p.Ala415Thr).  The Common Hereditary Cancers Panel offered by [facility] includes sequencing and/or deletion duplication testing of the following 48 genes: APC, ATM, AXIN2, BARD1, BMPR1A, BRCA1, BRCA2, BRIP1, CDH1, CDK4, CDKN2A (p14ARF), CDKN2A (p16INK4a), CHEK2, CTNNA1, DICER1, EPCAM (Deletion/duplication testing only), GREM1 (promoter region deletion/duplication testing only), KIT, MEN1, MLH1, MSH2, MSH3, MSH6, MUTYH, NBN, NF1, NHTL1, PALB2, PDGFRA, PMS2, POLD1, POLE, PTEN, RAD50, RAD51C, RAD51D, RNF43, SDHB, SDHC, SDHD, SMAD4, SMARCA4. STK11, TP53, TSC1, TSC2, and VHL.  The following genes were evaluated for sequence changes only: SDHA and HOXB13 c.251G>A variant only.    11/17/2018 Pathology Results   Diagnosis 11/17/18 1. Soft tissue, biopsy, Diaphragmatic nodules - METASTATIC ADENOCARCINOMA, CONSISTENT WITH PATIENT'S CLINICAL HISTORY OF CHOLANGIOCARCINOMA. SEE NOTE 2. Liver, biopsy, Left - LIVER PARENCHYMA WITH A BENIGN FIBROTIC NODULE - NO EVIDENCE OF MALIGNANCY 3. Stomach, biopsy - BENIGN PAPILLARY MESOTHELIAL HYPERPLASIA - NO EVIDENCE OF MALIGNANCY   11/29/2018 Imaging   CT CAP WO Contrast  IMPRESSION: 1. Dominant liver mass appears grossly stable from  09/29/2018. Additional liver lesions are too small to characterize but were not shown to be hypermetabolic on PET. 2. Mild nodularity of both adrenal glands with associated hypermetabolism on 09/29/2018. Continued attention on follow-up exams is warranted. 3. Small right lower lobe nodules, stable from 09/07/2018. Again, attention on follow-up is recommended. 4. Trace bilateral pleural fluid. 5. Aortic atherosclerosis (ICD10-170.0). Coronary artery calcification. 6. Enlarged pulmonic trunk, indicative of pulmonary arterial hypertension.     11/30/2018 - 06/14/2019 Chemotherapy   First line chemo Oxaliplatin and gemcitabine q2weeks starting 11/30/18. Stopped Oxaliplatin on 05/03/19 due to worsening neuropathy. Added Cisplatin with C13 on 05/17/19 and stopped on C15 06/14/19 due to neuropathy. Reduced to maintenance single agent Gemcitabine on 06/28/19.    01/20/2019 Imaging   CT CAP IMPRESSION: restaging  1. Mild interval increase in size of dominant liver mass involving segment 4 and segment 5. 2. No significant or progressive adrenal nodularity identified to suggest metastatic disease. 3. Unchanged appearance of small right lower lobe and lingular lung nodules, nonspecific.   03/21/2019 PET scan   IMPRESSION: 1. Large right hepatic mass with SUV uptake near background hepatic activity, dramatic response to therapy, also with decrease in size when compared to the prior study. 2. Signs of prior right mastectomy and axillary dissection as before. 3. Left adrenal activity in no longer above the level of activity seen in the contralateral, right adrenal gland or in the liver. Attention on follow-up. 4. No new signs of disease.   06/27/2019 PET scan   IMPRESSION: 1. Further decrease in size of a dominant hepatic mass which is non FDG avid. 2. No evidence of metastatic disease. 3. No evidence of left adrenal hypermetabolism or mass. 4. Incidental findings, including: Uterine fibroids.  Coronary artery atherosclerosis. Aortic Atherosclerosis (ICD10-I70.0). Pulmonary artery enlargement suggests pulmonary arterial hypertension.   06/28/2019 -  Chemotherapy   Maintenance single agent Gemcitabine q2weeks starting on  06/28/19 with C16.  ------On chemo break since 12/27/19.  ------She restarted Single Agent Gemcitabine q2weeks on 04/03/20 due to mild disease progression in liver on 02/2020 PET    09/15/2019 PET scan   IMPRESSION: 1. In the region of regional tumor at the junction of the right and left hepatic lobe, there is continued hypodensity and overall activity slightly less than that of the surrounding normal liver, indicating effective response to therapy. No current worrisome hypermetabolic lesion is identified in the liver or elsewhere. 2. Chronic asymmetric edema in the subcutaneous tissues of the right upper extremity, nonspecific. The patient has had a prior right mastectomy and right axillary dissection. 3. Other imaging findings of potential clinical significance: Aortic Atherosclerosis (ICD10-I70.0). Coronary atherosclerosis. Mild cardiomegaly. Small right and trace left pleural effusions. Subcutaneous and mild mesenteric edema. Suspected uterine fibroids.   12/18/2019 PET scan   IMPRESSION: 1. No significant abnormal activity to suggest active/recurrent malignancy. 2. Lingual tonsillar activity is mildly increased from prior but probably incidental/physiologic, attention on follow up suggested. 3. Other imaging findings of potential clinical significance: Third spacing of fluid with trace ascites, mesenteric edema, subcutaneous edema, and possibly subtle pulmonary edema. Mild cardiomegaly. Aortic Atherosclerosis (ICD10-I70.0). Coronary atherosclerosis. Right ovarian dermoid. Calcified uterine fibroids. Renal cysts. Right glenohumeral arthropathy. Mild to moderate scoliosis. Suspected pulmonary arterial hypertension.   03/14/2020 PET scan   IMPRESSION: 1.  New small focus of hypermetabolism in segment 4A of the liver worrisome for new metastatic focus. 2. No other findings for metastatic disease involving the neck, chest, abdomen or pelvis.     06/11/2020 PET scan   IMPRESSION: 1. No suspicious hypermetabolic activity within the neck, chest, abdomen or pelvis. 2. The treated dominant peripheral cholangiocarcinoma appears slightly enlarged compared with recent prior studies, and there is a questionable new lesion more superiorly in the left lobe. Although without associated hypermetabolic activity, these lesions are suspicious and would be better assessed with abdominal MRI without and with contrast.   09/17/2020 PET scan   IMPRESSION: 1. Recurrent hypermetabolic activity around the periphery of the dominant hepatic mass consistent with local recurrence. This mass appears slightly larger. Possible small metastasis in the dome of the left hepatic lobe is unchanged in size, without hypermetabolic activity. 2. No distant metastases identified. 3. Stable incidental findings as detailed above.   10/16/2020 -  Chemotherapy    Patient is on Treatment Plan: PANCREAS GEM/CIS Q14D   Patient is on Antibody Plan: BLADDER DURVALUMAB Q28D       CURRENT THERAPY:  1. Maintenance single agent Gemcitabine q2weeks starting on 06/28/19 with C16. On chemo break since 12/27/19. She restarted Single Agent Gemcitabine q2weeks on 04/03/20 due to mild disease progression. 2. Adding imfinzi due to mild progression on 09/26/20 PET scan, delayed due to insurance approval, started 10/16/2020 10 mg/kg and full dose 10/30/20 will be every 4 weeks from that date  INTERVAL HISTORY: Ms. Kloehn returns for follow-up and treatment as scheduled, last seen by Dr. Annamaria Boots 10/16/2020 and completed another cycle of gemcitabine and first Imfinzi, with full dose Imfinzi 8/17.  She was seen by GI Dr. Havery Moros 11/01/2020 for reflux, he refilled Prevacid.  He does not plan to repeat screening  colonoscopy given her cancer and ongoing treatment.  Today she presents to clinic by herself.  She is doing well overall.  She feels the neuropathy in her hands and feet worsened after Imfinzi.  She takes gabapentin occasionally which is helpful.  She does not have trouble sleeping.  The sensation  is like "electricity and shocks" through her feet.  She is considering seeing a doctor in [place] but has not made an appointment yet.  Energy is low due to neuropathy and having to stop to rest but otherwise adequate.  She salivates frequently without nausea or vomiting.  Eating and drinking well.  Bowels moving well.  Denies new or worsening abdominal or other pain.  Denies rash, cough, fever/chills, or other new concerns.   MEDICAL HISTORY:  Past Medical History:  Diagnosis Date   Anemia    Anxiety    Arthritis    Breast cancer ([place]) 1992   right   Cataract    Bilateral eyes - surgery to remove   Chronic diastolic CHF (congestive heart failure) (HCC)    Chronic kidney disease    CKD stage 3   Complication of anesthesia    "something they use make me itch for a couple of days."   Depression    Diabetes mellitus    type 2   Duodenitis 01/18/2002   Fainting spell    GERD (gastroesophageal reflux disease)    Heart murmur    never has caused any problems   Hiatal hernia 08/08/2008, 01/18/2002   History of pneumonia    x 2   Hyperlipidemia    Hypertension    Liver cancer ([place]) 08/2018   chemotherapy   Lymphedema 2017   Right arm   Peripheral neuropathy     SURGICAL HISTORY: Past Surgical History:  Procedure Laterality Date   AXILLARY SURGERY     cyst removal, right   COLONOSCOPY  09/23/2018   Dr. Havery Moros - polyps   EYE SURGERY Bilateral    cataracts to remove   LAPAROSCOPY N/A 11/17/2018   Procedure: LAPAROSCOPY DIAGNOSTIC, INTRAOPERATIVE ULTRASOUND, PERITONEAL BIOPSIES;  Surgeon: [name], MD;  Location: [facility];  Service: General;  Laterality: N/A;  GENERAL AND  EPIDURAL   LIVER BIOPSY  08/2018   Dr. Lindwood Coke   MASTECTOMY  1992   right, with flap   NM [place]  06/11/2009   Protocol:[place], post stress EF58%, EKG negative for ischemia, low risk   PORTACATH PLACEMENT N/A 12/02/2018   Procedure: INSERTION PORT-A-CATH;  Surgeon: [name], MD;  Location: [facility];  Service: General;  Laterality: N/A;   RECONSTRUCTION BREAST W/ TRAM FLAP Right    TONSILLECTOMY  1958   TRANSTHORACIC ECHOCARDIOGRAM  12/24/2009   LVEF =>55%, normal study   UPPER GI ENDOSCOPY      I have reviewed the social history and family history with the patient and they are unchanged from previous note.  ALLERGIES:  has No Known Allergies.  MEDICATIONS:  Current Outpatient Medications  Medication Sig Dispense Refill   amLODipine (NORVASC) 10 MG tablet Take 1 tablet (10 mg total) by mouth daily. 90 tablet 3   aspirin 81 MG tablet Take 81 mg by mouth daily.      carvedilol (COREG) 25 MG tablet Take 1 tablet (25 mg total) by mouth 2 (two) times daily with a meal. 180 tablet 3   furosemide (LASIX) 40 MG tablet Take 1 tablet (40 mg total) by mouth daily. 30 tablet 11   gabapentin (NEURONTIN) 100 MG capsule Take 1 capsule (100 mg total) by mouth every 8 (eight) hours as needed. 180 capsule 2   glucose blood test strip 1 each by Other route 2 (two) times daily. Use Onetouch verio test strips as instructed to check blood sugar twice daily. 100 each 2  hydrALAZINE (APRESOLINE) 100 MG tablet TAKE THREE TIMES DAILY-AM, MIDDLE OF THE DAY AND IN THE EVENING. 270 tablet 3   KLOR-CON M10 10 MEQ tablet TAKE 1 TABLET BY MOUTH EVERY DAY 90 tablet 1   lansoprazole (PREVACID) 15 MG capsule Take 1 capsule (15 mg total) by mouth daily at 12 noon. Take 1 capsule by mouth once or twice a day as needed 90 capsule 3   lidocaine-prilocaine (EMLA) cream Apply 1 application topically as needed. 30 g 1   loratadine (CLARITIN) 10 MG tablet Take 10 mg by mouth daily as needed for allergies.      Omega 3 1000 MG CAPS Take 2,000 mg by mouth daily.      ondansetron (ZOFRAN-ODT) 4 MG disintegrating tablet Take 1 tablet (4 mg total) by mouth every 8 (eight) hours as needed for nausea or vomiting. 30 tablet 1   polyethylene glycol (MIRALAX / GLYCOLAX) 17 g packet Take 17 g by mouth daily as needed. 14 each 0   sitaGLIPtin (JANUVIA) 100 MG tablet Take 1 tablet (100 mg total) by mouth daily. 90 tablet 3   valsartan (DIOVAN) 320 MG tablet Take 1 tablet (320 mg total) by mouth daily. 90 tablet 3   Current Facility-Administered Medications  Medication Dose Route Frequency Provider Last Rate Last Admin   triamcinolone acetonide (KENALOG) 10 MG/ML injection 10 mg  10 mg Other Once Harriet Masson, DPM       Facility-Administered Medications Ordered in Other Visits  Medication Dose Route Frequency Provider Last Rate Last Admin   sodium chloride flush (NS) 0.9 % injection 10 mL  10 mL Intracatheter PRN [name], MD   10 mL at 11/13/20 1201    PHYSICAL EXAMINATION: ECOG PERFORMANCE STATUS: 1 - Symptomatic but completely ambulatory  Vitals:   11/13/20 0938  BP: (!) 185/73  Pulse: 73  Resp: 17  Temp: 99.1 F (37.3 C)  SpO2: 97%   Filed Weights   11/13/20 0938  Weight: 128 lb 12.8 oz (58.4 kg)    GENERAL:alert, no distress and comfortable SKIN: No rash EYES: sclera clear LUNGS:  normal breathing effort HEART: no lower extremity edema NEURO: alert & oriented x 3 with fluent speech, ambulates with a cane PAC without erythema  LABORATORY DATA:  I have reviewed the data as listed CBC Latest Ref Rng & Units 11/13/2020 10/30/2020 10/16/2020  WBC 4.0 - 10.5 K/uL 7.0 7.1 6.0  Hemoglobin 12.0 - 15.0 g/dL 9.2(L) 9.7(L) 9.8(L)  Hematocrit 36.0 - 46.0 % 28.0(L) 29.2(L) 29.8(L)  Platelets 150 - 400 K/uL 225 211 154     CMP Latest Ref Rng & Units 11/13/2020 10/30/2020 10/16/2020  Glucose 70 - 99 mg/dL 107(H) 73 98  BUN 8 - 23 mg/dL 28(H) 24(H) 35(H)  Creatinine 0.44 - 1.00 mg/dL 1.22(H)  1.44(H) 1.79(H)  Sodium 135 - 145 mmol/L 145 142 141  Potassium 3.5 - 5.1 mmol/L 4.6 4.6 4.4  Chloride 98 - 111 mmol/L 112(H) 107 108  CO2 22 - 32 mmol/L $RemoveB'24 25 25  'nDImMdnH$ Calcium 8.9 - 10.3 mg/dL 9.1 9.2 9.3  Total Protein 6.5 - 8.1 g/dL 7.4 7.5 7.2  Total Bilirubin 0.3 - 1.2 mg/dL 0.3 0.3 0.3  Alkaline Phos 38 - 126 U/L 70 69 67  AST 15 - 41 U/L $Remo'18 16 15  'hsDdh$ ALT 0 - 44 U/L $Remo'9 10 10      'jnjgn$ RADIOGRAPHIC STUDIES: I have personally reviewed the radiological images as listed and agreed with the findings in the report. No  results found.   ASSESSMENT & PLAN:  CHERA SLIVKA is a 80 y.o. female with   1. Intrahepatic cholangiocarcinoma, cT1N0M1, with peritoneal metastasis, MSS, IDH1 mutation (+) -Diagnosed in 09/2018 biopsy of her liver mass shows adenocarcinoma, most consistent with cholangiocarcinoma. 09/29/18 PET scan showed no evidence of distant metastasis. -Unfortunately she was found to have peritoneal metastasis to right diaphragm and surgery was aborted. -Her CT AP from 11/29/18 shows dominate liver mass stable to mild increase, mild nodularity of both adrenal glands which warrants being monitored.  No visible peritoneal metastasis on CT scan. -FO results showed MSI stable disease, IDH mutation positive.  Consider IDH 1 inhibitor in the future.  Not a candidate for immunotherapy -She began first line oxaliplatin (due to CKD) and gemcitabine every 2 weeks on 11/30/2018   -she responded very well to treatment and tolerated without significant toxicities except progressive neuropathy. Oxali was dose reduced and eventually stopped after cycle 11. She received single agent gemcitabine with cycle 12. Cisplatin added with C13 on 05/17/19 but neuropathy worsened and stopped on 06/14/19. -She has been receiving single agent gemcitabine every 2 weeks starting from 06/28/19 C16, tolerating well. She took chemo breast from 10/21 - 1/22 -PET from 03/14/2020 showed concern for new liver lesion, she restarted maintenance  gemcitabine every 2 weeks from 04/03/2020.  -06/11/2020 PET showed stable disease -09/26/2020 PET showed recurrent hypermetabolic activity in the dominant liver mass, consistent with recurrent disease -Per new study data Dr. Burr Medico has recommended to add durvalumab with gemcitabine, Due to insurance/drug coverage this delayed the start until 8/3    2.  Grade 3 CIPN -secondary to Oxaliplatin, eventually dc'd after cycle 11 -started cisplatin on 05/17/19, neuropathy worsened in her feet with unsteady gait and functional limitations, decreased vibratory sense on tuning fork exam 05/31/19. Cisplatin was dose reduced then eventually stopped on 06/14/19.  -low efficacy of gabapentin and cymbalta, also tried lyrica briefly.  She completed PT for balance. Ambulates with cane to avoid fall -She has functional difficulties and ambulates with a cane -Follow-up open with Dr. Mickeal Skinner, pt did not find helpful. Also seen by chiropractor -Continues to have decreased function, on gabapentin 100 mg as needed -She went to chronic conditions center and was offered a type of treatment that would be $6000, she declined -Ineligible for [facility] [place] 2102 neuropathy clinical trial -She feels CIPN worsened slightly on Imfinzi, no functional changes.  She prefers to continue.   3. H/o right breast cancer, Genetics negative  -s/p mastectomy in 1992, per patient did not require adjuvant therapy -previously followed by Dr. Jana Hakim  -VUS of gene APC; genetics otherwise normal   4. Comorbidities: DM, HTN, hiatal hernia, GERD, renal artery stenosis, CHF -Follow-up with PCP, nephrology, and Dr. Dwyane Dee, and cardiology -stable, well controlled    5. Family support -she has a son and daughter who are supportive.  Son is the former Engineer, petroleum -patient remains independent, ambulates with a cane   6. Goal of care discussion -She understands the treatment goal is palliative, to control her disease, prolong her life, and give  her good quality of life.  -she is full code now    7. CKD Stage III -continue monitoring Scr 1.4 - 1.7   Disposition: Ms. Furnish appears stable.  She completed another cycle of gemcitabine (q2 weeks) and durvalumab (10 mg/kg 10/16/20 and 1500 mg 10/30/20, now q4 weeks), tolerated well with out significant side effects.  She has persistent neuropathy, managed with gabapentin.  She  remains independent, but this is a very depressing side effect for her.  She otherwise tolerates treatment well.  Labs reviewed, adequate to proceed with another cycle of gemcitabine today.  She will return for follow-up, gemcitabine, and durvalumab in 2 weeks.  All questions were answered. The patient knows to call the clinic with any problems, questions or concerns. No barriers to learning were detected.     Alla Feeling, NP 11/13/20

## 2020-11-13 ENCOUNTER — Telehealth: Payer: Self-pay | Admitting: Hematology

## 2020-11-13 ENCOUNTER — Inpatient Hospital Stay: Payer: Medicare Other

## 2020-11-13 ENCOUNTER — Encounter: Payer: Self-pay | Admitting: Nurse Practitioner

## 2020-11-13 ENCOUNTER — Inpatient Hospital Stay (HOSPITAL_BASED_OUTPATIENT_CLINIC_OR_DEPARTMENT_OTHER): Payer: Medicare Other | Admitting: Nurse Practitioner

## 2020-11-13 ENCOUNTER — Telehealth: Payer: Self-pay

## 2020-11-13 ENCOUNTER — Other Ambulatory Visit: Payer: Self-pay

## 2020-11-13 VITALS — BP 176/71 | HR 69 | Resp 16

## 2020-11-13 VITALS — BP 185/73 | HR 73 | Temp 99.1°F | Resp 17 | Wt 128.8 lb

## 2020-11-13 DIAGNOSIS — Z5112 Encounter for antineoplastic immunotherapy: Secondary | ICD-10-CM | POA: Diagnosis not present

## 2020-11-13 DIAGNOSIS — Z79899 Other long term (current) drug therapy: Secondary | ICD-10-CM | POA: Diagnosis not present

## 2020-11-13 DIAGNOSIS — C221 Intrahepatic bile duct carcinoma: Secondary | ICD-10-CM

## 2020-11-13 DIAGNOSIS — Z95828 Presence of other vascular implants and grafts: Secondary | ICD-10-CM

## 2020-11-13 DIAGNOSIS — C786 Secondary malignant neoplasm of retroperitoneum and peritoneum: Secondary | ICD-10-CM | POA: Diagnosis not present

## 2020-11-13 DIAGNOSIS — Z5111 Encounter for antineoplastic chemotherapy: Secondary | ICD-10-CM | POA: Diagnosis not present

## 2020-11-13 LAB — CMP (CANCER CENTER ONLY)
ALT: 9 U/L (ref 0–44)
AST: 18 U/L (ref 15–41)
Albumin: 3.3 g/dL — ABNORMAL LOW (ref 3.5–5.0)
Alkaline Phosphatase: 70 U/L (ref 38–126)
Anion gap: 9 (ref 5–15)
BUN: 28 mg/dL — ABNORMAL HIGH (ref 8–23)
CO2: 24 mmol/L (ref 22–32)
Calcium: 9.1 mg/dL (ref 8.9–10.3)
Chloride: 112 mmol/L — ABNORMAL HIGH (ref 98–111)
Creatinine: 1.22 mg/dL — ABNORMAL HIGH (ref 0.44–1.00)
GFR, Estimated: 45 mL/min — ABNORMAL LOW (ref >60)
Glucose, Bld: 107 mg/dL — ABNORMAL HIGH (ref 70–99)
Potassium: 4.6 mmol/L (ref 3.5–5.1)
Sodium: 145 mmol/L (ref 135–145)
Total Bilirubin: 0.3 mg/dL (ref 0.3–1.2)
Total Protein: 7.4 g/dL (ref 6.5–8.1)

## 2020-11-13 LAB — CBC WITH DIFFERENTIAL (CANCER CENTER ONLY)
Abs Immature Granulocytes: 0.03 10*3/uL (ref 0.00–0.07)
Basophils Absolute: 0 10*3/uL (ref 0.0–0.1)
Basophils Relative: 1 %
Eosinophils Absolute: 0.6 10*3/uL — ABNORMAL HIGH (ref 0.0–0.5)
Eosinophils Relative: 9 %
HCT: 28 % — ABNORMAL LOW (ref 36.0–46.0)
Hemoglobin: 9.2 g/dL — ABNORMAL LOW (ref 12.0–15.0)
Immature Granulocytes: 0 %
Lymphocytes Relative: 14 %
Lymphs Abs: 1 10*3/uL (ref 0.7–4.0)
MCH: 32.6 pg (ref 26.0–34.0)
MCHC: 32.9 g/dL (ref 30.0–36.0)
MCV: 99.3 fL (ref 80.0–100.0)
Monocytes Absolute: 1.2 10*3/uL — ABNORMAL HIGH (ref 0.1–1.0)
Monocytes Relative: 18 %
Neutro Abs: 4.1 10*3/uL (ref 1.7–7.7)
Neutrophils Relative %: 58 %
Platelet Count: 225 10*3/uL (ref 150–400)
RBC: 2.82 MIL/uL — ABNORMAL LOW (ref 3.87–5.11)
RDW: 16 % — ABNORMAL HIGH (ref 11.5–15.5)
WBC Count: 7 10*3/uL (ref 4.0–10.5)
nRBC: 0 % (ref 0.0–0.2)

## 2020-11-13 MED ORDER — SODIUM CHLORIDE 0.9 % IV SOLN
600.0000 mg/m2 | Freq: Once | INTRAVENOUS | Status: DC
  Filled 2020-11-13: qty 26

## 2020-11-13 MED ORDER — SODIUM CHLORIDE 0.9% FLUSH
10.0000 mL | INTRAVENOUS | Status: DC | PRN
  Administered 2020-11-13: 10 mL

## 2020-11-13 MED ORDER — SODIUM CHLORIDE 0.9% FLUSH
10.0000 mL | Freq: Once | INTRAVENOUS | Status: AC
  Administered 2020-11-13: 10 mL

## 2020-11-13 MED ORDER — PROCHLORPERAZINE MALEATE 10 MG PO TABS
10.0000 mg | ORAL_TABLET | Freq: Once | ORAL | Status: AC
  Administered 2020-11-13: 10 mg via ORAL
  Filled 2020-11-13: qty 1

## 2020-11-13 MED ORDER — SODIUM CHLORIDE 0.9 % IV SOLN: Freq: Once | INTRAVENOUS | Status: AC

## 2020-11-13 MED ORDER — HEPARIN SOD (PORK) LOCK FLUSH 100 UNIT/ML IV SOLN
500.0000 [IU] | Freq: Once | INTRAVENOUS | Status: AC | PRN
  Administered 2020-11-13: 500 [IU]

## 2020-11-13 MED ORDER — SODIUM CHLORIDE 0.9 % IV SOLN
600.0000 mg/m2 | Freq: Once | INTRAVENOUS | Status: AC
  Administered 2020-11-13: 988 mg via INTRAVENOUS
  Filled 2020-11-13: qty 25.98

## 2020-11-13 NOTE — Telephone Encounter (Signed)
-----   Message from Alla Feeling, NP sent at 11/13/2020  3:01 PM EDT ----- Thank you Vergia Alcon.   Mickel Baas, could you call Ms. Boccio and let her know these interactions below and that I do NOT recommend nervine supplement.   Thanks, Regan Rakers  ----- Message ----- From: Tora Kindred, [facility] Sent: 11/13/2020  10:59 AM EDT To: Alla Feeling, NP, [facility]  Nervine tonic by [employer] contains [place].  Moderate interactions exist w/ Coreg, Doivan & Norvasc due to additive effects w/ blood pressure lowering drugs due to hypotensive properties found in betony.  Nervine tonic by [employer] also contains [employer]. Moderate interactions exist w/ Coreg & Gemcitabine Taking black cohosh w/ hepatotoxic drugs (Gemcitabine) may increase the risk of liver damage.  There are concerns that black cohosh may be associated w/ cases of liver failure and autoimmune hepatitis.  GT ----- Message ----- From: Tora Kindred, [facility] Sent: 11/13/2020  10:44 AM EDT To: Alla Feeling, NP, [facility]  I don't recall answering a question about nervine for this patient. I can run an interaction screen if you want- GT ----- Message ----- From: Alla Feeling, NP Sent: 11/13/2020  10:02 AM EDT To: [facility]  Pharmacy,  Can you please jog my memory if I asked you about "nervine" supplement for neuropathy patient would like to take.   Thanks, Regan Rakers

## 2020-11-13 NOTE — Patient Instructions (Signed)
[facility]   ?Discharge Instructions: ?Thank you for choosing [facility] to provide your oncology and hematology care.  ? ?If you have a lab appointment with the Cancer Center, please go directly to the Cancer Center and check in at the registration area. ?  ?Wear comfortable clothing and clothing appropriate for easy access to any Portacath or PICC line.  ? ?We strive to give you quality time with your provider. You may need to reschedule your appointment if you arrive late (15 or more minutes).  Arriving late affects you and other patients whose appointments are after yours.  Also, if you miss three or more appointments without notifying the office, you may be dismissed from the clinic at the provider?s discretion.    ?  ?For prescription refill requests, have your pharmacy contact our office and allow 72 hours for refills to be completed.   ? ?Today you received the following chemotherapy and/or immunotherapy agents: gemcitabine    ?  ?To help prevent nausea and vomiting after your treatment, we encourage you to take your nausea medication as directed. ? ?BELOW ARE SYMPTOMS THAT SHOULD BE REPORTED IMMEDIATELY: ?*FEVER GREATER THAN 100.4 F (38 ?C) OR HIGHER ?*CHILLS OR SWEATING ?*NAUSEA AND VOMITING THAT IS NOT CONTROLLED WITH YOUR NAUSEA MEDICATION ?*UNUSUAL SHORTNESS OF BREATH ?*UNUSUAL BRUISING OR BLEEDING ?*URINARY PROBLEMS (pain or burning when urinating, or frequent urination) ?*BOWEL PROBLEMS (unusual diarrhea, constipation, pain near the anus) ?TENDERNESS IN MOUTH AND THROAT WITH OR WITHOUT PRESENCE OF ULCERS (sore throat, sores in mouth, or a toothache) ?UNUSUAL RASH, SWELLING OR PAIN  ?UNUSUAL VAGINAL DISCHARGE OR ITCHING  ? ?Items with * indicate a potential emergency and should be followed up as soon as possible or go to the Emergency Department if any problems should occur. ? ?Please show the CHEMOTHERAPY ALERT CARD or IMMUNOTHERAPY ALERT CARD at check-in  to the Emergency Department and triage nurse. ? ?Should you have questions after your visit or need to cancel or reschedule your appointment, please contact [facility]  Dept: [phone]  and follow the prompts.  Office hours are 8:00 a.m. to 4:30 p.m. Monday - Friday. Please note that voicemails left after 4:00 p.m. may not be returned until the following business day.  We are closed weekends and major holidays. You have access to a nurse at all times for urgent questions. Please call the main number to the clinic Dept: [phone] and follow the prompts. ? ? ?For any non-urgent questions, you may also contact your provider using MyChart. We now offer e-Visits for anyone 18 and older to request care online for non-urgent symptoms. For details visit [email or web address]. ?  ?Also download the MyChart app! Go to the app store, search "MyChart", open the app, select , and log in with your MyChart username and password. ? ?Due to Covid, a mask is required upon entering the hospital/clinic. If you do not have a mask, one will be given to you upon arrival. For doctor visits, patients may have 1 support person aged 18 or older with them. For treatment visits, patients cannot have anyone with them due to current Covid guidelines and our immunocompromised population.  ? ?

## 2020-11-13 NOTE — Telephone Encounter (Signed)
Scheduled follow-up appointments per 8/31 los. Patient is aware. 

## 2020-11-13 NOTE — Telephone Encounter (Signed)
Attempted to call pt to give below message. Home number busy signal and cell phone rang with no answer and no VM set up.

## 2020-11-15 DIAGNOSIS — Z20822 Contact with and (suspected) exposure to covid-19: Secondary | ICD-10-CM | POA: Diagnosis not present

## 2020-11-20 ENCOUNTER — Encounter: Payer: Self-pay | Admitting: Hematology

## 2020-11-20 ENCOUNTER — Telehealth: Payer: Self-pay

## 2020-11-20 ENCOUNTER — Other Ambulatory Visit: Payer: Self-pay | Admitting: Gastroenterology

## 2020-11-20 ENCOUNTER — Encounter: Payer: Self-pay | Admitting: Oncology

## 2020-11-20 NOTE — Telephone Encounter (Signed)
1030 am.  Phone call made to patient to schedule a home visit.  Patient was to be seen last month by Palliative Care but canceled due to a conflict with the time.  No answer on cell phone.  Message has been left requesting a call back.

## 2020-11-22 ENCOUNTER — Ambulatory Visit (INDEPENDENT_AMBULATORY_CARE_PROVIDER_SITE_OTHER): Payer: Medicare Other | Admitting: Podiatry

## 2020-11-22 ENCOUNTER — Encounter: Payer: Self-pay | Admitting: Podiatry

## 2020-11-22 ENCOUNTER — Other Ambulatory Visit: Payer: Self-pay

## 2020-11-22 DIAGNOSIS — N1832 Chronic kidney disease, stage 3b: Secondary | ICD-10-CM

## 2020-11-22 DIAGNOSIS — B351 Tinea unguium: Secondary | ICD-10-CM | POA: Diagnosis not present

## 2020-11-22 DIAGNOSIS — M79675 Pain in left toe(s): Secondary | ICD-10-CM | POA: Diagnosis not present

## 2020-11-22 DIAGNOSIS — E1122 Type 2 diabetes mellitus with diabetic chronic kidney disease: Secondary | ICD-10-CM

## 2020-11-22 DIAGNOSIS — M79674 Pain in right toe(s): Secondary | ICD-10-CM

## 2020-11-22 NOTE — Progress Notes (Signed)
This patient returns to my office for at risk foot care.  This patient requires this care by a professional since this patient will be at risk due to having diabetes and peripheral neuropathy due to chemo.  This patient is unable to cut nails herself since the patient cannot reach hiernails.These nails are painful walking and wearing shoes.  This patient presents for at risk foot care today.  General Appearance  Alert, conversant and in no acute stress.  Vascular  Dorsalis pedis  pulses are weakly  palpable  bilaterally. Posterior tibial pulses are absent  B/L. Capillary return is within normal limits  Bilaterally.Cold feet  Bilaterally.Absent digital hair B/L.  Neurologic  Senn-Weinstein monofilament wire test diminished   bilaterally. Muscle power within normal limits bilaterally.  Nails Thick disfigured discolored nails with subungual debris  from second  to fifth toes bilaterally. No evidence of bacterial infection or drainage bilaterally.  Orthopedic  No limitations of motion  feet .  No crepitus or effusions noted.  No bony pathology or digital deformities noted.  HAV  B/L  Skin  normotropic skin with no porokeratosis noted bilaterally.  No signs of infections or ulcers noted.     Onychomycosis  Pain in right toes  Pain in left toes  Consent was obtained for treatment procedures.   Mechanical debridement of nails 1-5  bilaterally performed with a nail nipper.  Filed with dremel without incident.  Checked on her diabetic shoes and she needs to see Dr.  Volanda Napoleon.   Return office visit   3 months                   Told patient to return for periodic foot care and evaluation due to potential at risk complications.   Gardiner Barefoot DPM

## 2020-11-26 NOTE — Progress Notes (Signed)
Donna May   [phone] Fax:([phone]) [phone]   Clinic Follow up Note   Patient Care Team: [name], MD as PCP - General (Family Medicine) [name], [name], MD as PCP - Cardiology (Cardiology) [name], [name], MD as Consulting Physician (Cardiology) [name], MD as Consulting Physician (Obstetrics and Gynecology) [name], [name], NP as Nurse Practitioner (Hematology and Oncology) [place] [name], MD as Consulting Physician (Hematology) [name], [name], MD as Consulting Physician (Gastroenterology) [name], RN (Inactive) as Oncology Nurse Navigator [name], MD as Consulting Physician (General Surgery) 11/27/2020  CHIEF COMPLAINT: Follow up intrahepatic cholangiocarcinoma   SUMMARY OF ONCOLOGIC HISTORY: Oncology History Overview Note  Cancer Staging Intrahepatic cholangiocarcinoma ([place]) Staging form: Intrahepatic Bile Duct, AJCC 8th Edition - Clinical stage from 09/23/2018: Stage IB (cT1b, cN0, cM0) - Signed by [name], MD on 10/06/2018    Intrahepatic cholangiocarcinoma ([place])  09/07/2018 Imaging   CT Chest IMPRESSION: 1. New, enhancing mass involving segment 4 of the liver and fundus of gallbladder is concerning for malignancy. This may represent either metastatic disease from breast cancer or neoplasm primary to the liver or hepatic biliary tree. Further evaluation with contrast enhanced CT of the abdomen and pelvis is recommended. 2. No findings to suggest metastatic disease within the chest. 3.  Aortic Atherosclerosis (ICD10-I70.0). 4. Coronary artery calcifications.   09/13/2018 Pathology Results   Diagnosis Liver, needle/core biopsy - ADENOCARCINOMA. Microscopic Comment Immunohistochemistry for CK7 is positive. CK20, TTF1, CDX-2, GATA-3, PAX 8, Qualitative ER, p63 and CK5/6 are negative. The provided clinical history of remote mammary carcinoma is noted. Based on the morphology and  immunophenotype of the adenocarcinoma observed in this specimen, primary cholangiocarcinoma is favored. Clinical and radiologic correlation are  encouraged. Results reported to [employer] on 09/15/2018. Intradepartmental consultation (Dr. Vic Ripper).   09/13/2018 Initial Diagnosis   Cholangiocarcinoma ([place])   09/23/2018 Procedure   Colonoscopy by Dr. Havery Moros 09/23/18  IMPRESSION - Two 3 to 4 mm polyps in the ascending colon, removed with a cold snare. Resected and retrieved. - Five 3 to 5 mm polyps in the transverse colon, removed with a cold snare. Resected and retrieved. - One 5 mm polyp at the splenic flexure, removed with a cold snare. Resected and retrieved. - Three 3 to 5 mm polyps in the sigmoid colon, removed with a cold snare. Resected and retrieved. - The examination was otherwise normal. Upper Endopscy by Dr. Havery Moros 09/23/18  IMPRESSION - Esophagogastric landmarks identified. - 2 cm hiatal hernia. - Normal esophagus otherwise. - A single gastric polyp. Resected and retrieved. - Mild gastritis. Biopsied. - Normal duodenal bulb and second portion of the duodenum.   09/23/2018 Pathology Results   Diagnosis 09/23/18 1. Surgical [P], duodenum - BENIGN SMALL BOWEL MUCOSA. - NO ACTIVE INFLAMMATION OR VILLOUS ATROPHY IDENTIFIED. 2. Surgical [P], stomach, polyp - HYPERPLASTIC POLYP(S). - THERE IS NO EVIDENCE OF MALIGNANCY. 3. Surgical [P], gastric antrum and gastric body - CHRONIC INACTIVE GASTRITIS. - THERE IS NO EVIDENCE OF HELICOBACTER-PYLORI, DYSPLASIA, OR MALIGNANCY. - SEE COMMENT. 4. Surgical [P], colon, sigmoid, splenic flexure, transverse and ascending, polyp (9) - TUBULAR ADENOMA(S). - SESSILE SERRATED POLYP WITHOUT CYTOLOGIC DYSPLASIA. - HIGH GRADE DYSPLASIA IS NOT IDENTIFIED. 5. Surgical [P], colon, sigmoid, polyp (2) - HYPERPLASTIC POLYP(S). - THERE IS NO EVIDENCE OF MALIGNANCY.   09/23/2018 Cancer Staging   Staging form: Intrahepatic Bile Duct, AJCC 8th  Edition - Clinical stage from 09/23/2018: Stage IB (cT1b, cN0, cM0) - Signed by [name],  MD on 10/06/2018   09/29/2018 PET scan   PET 09/29/18 IMPRESSION: 1. Hypermetabolic mass in the RIGHT hepatic lobe consistent with biopsy proven adenocarcinoma. No additional liver metastasis. 2. No evidence of local breast cancer recurrence in the RIGHT breast or RIGHT axilla. 3. Mild bilateral hypermetabolic adrenal glands is favored benign hyperplasia. 4. No evidence of additional metastatic disease on skull base to thigh FDG PET scan.   11/06/2018 Genetic Testing   Negative genetic testing on the [facility] Common Hereditary Cancers panel. A variant of uncertain significance was identified in one of her APC genes, called c.1243G>A (p.Ala415Thr).  The Common Hereditary Cancers Panel offered by [facility] includes sequencing and/or deletion duplication testing of the following 48 genes: APC, ATM, AXIN2, BARD1, BMPR1A, BRCA1, BRCA2, BRIP1, CDH1, CDK4, CDKN2A (p14ARF), CDKN2A (p16INK4a), CHEK2, CTNNA1, DICER1, EPCAM (Deletion/duplication testing only), GREM1 (promoter region deletion/duplication testing only), KIT, MEN1, MLH1, MSH2, MSH3, MSH6, MUTYH, NBN, NF1, NHTL1, PALB2, PDGFRA, PMS2, POLD1, POLE, PTEN, RAD50, RAD51C, RAD51D, RNF43, SDHB, SDHC, SDHD, SMAD4, SMARCA4. STK11, TP53, TSC1, TSC2, and VHL.  The following genes were evaluated for sequence changes only: SDHA and HOXB13 c.251G>A variant only.    11/17/2018 Pathology Results   Diagnosis 11/17/18 1. Soft tissue, biopsy, Diaphragmatic nodules - METASTATIC ADENOCARCINOMA, CONSISTENT WITH PATIENT'S CLINICAL HISTORY OF CHOLANGIOCARCINOMA. SEE NOTE 2. Liver, biopsy, Left - LIVER PARENCHYMA WITH A BENIGN FIBROTIC NODULE - NO EVIDENCE OF MALIGNANCY 3. Stomach, biopsy - BENIGN PAPILLARY MESOTHELIAL HYPERPLASIA - NO EVIDENCE OF MALIGNANCY   11/29/2018 Imaging   CT CAP WO Contrast  IMPRESSION: 1. Dominant liver mass appears grossly stable from  09/29/2018. Additional liver lesions are too small to characterize but were not shown to be hypermetabolic on PET. 2. Mild nodularity of both adrenal glands with associated hypermetabolism on 09/29/2018. Continued attention on follow-up exams is warranted. 3. Small right lower lobe nodules, stable from 09/07/2018. Again, attention on follow-up is recommended. 4. Trace bilateral pleural fluid. 5. Aortic atherosclerosis (ICD10-170.0). Coronary artery calcification. 6. Enlarged pulmonic trunk, indicative of pulmonary arterial hypertension.     11/30/2018 - 06/14/2019 Chemotherapy   First line chemo Oxaliplatin and gemcitabine q2weeks starting 11/30/18. Stopped Oxaliplatin on 05/03/19 due to worsening neuropathy. Added Cisplatin with C13 on 05/17/19 and stopped on C15 06/14/19 due to neuropathy. Reduced to maintenance single agent Gemcitabine on 06/28/19.    01/20/2019 Imaging   CT CAP IMPRESSION: restaging  1. Mild interval increase in size of dominant liver mass involving segment 4 and segment 5. 2. No significant or progressive adrenal nodularity identified to suggest metastatic disease. 3. Unchanged appearance of small right lower lobe and lingular lung nodules, nonspecific.   03/21/2019 PET scan   IMPRESSION: 1. Large right hepatic mass with SUV uptake near background hepatic activity, dramatic response to therapy, also with decrease in size when compared to the prior study. 2. Signs of prior right mastectomy and axillary dissection as before. 3. Left adrenal activity in no longer above the level of activity seen in the contralateral, right adrenal gland or in the liver. Attention on follow-up. 4. No new signs of disease.   06/27/2019 PET scan   IMPRESSION: 1. Further decrease in size of a dominant hepatic mass which is non FDG avid. 2. No evidence of metastatic disease. 3. No evidence of left adrenal hypermetabolism or mass. 4. Incidental findings, including: Uterine fibroids.  Coronary artery atherosclerosis. Aortic Atherosclerosis (ICD10-I70.0). Pulmonary artery enlargement suggests pulmonary arterial hypertension.   06/28/2019 -  Chemotherapy   Maintenance single agent Gemcitabine q2weeks  starting on 06/28/19 with C16.  ------On chemo break since 12/27/19.  ------She restarted Single Agent Gemcitabine q2weeks on 04/03/20 due to mild disease progression in liver on 02/2020 PET    09/15/2019 PET scan   IMPRESSION: 1. In the region of regional tumor at the junction of the right and left hepatic lobe, there is continued hypodensity and overall activity slightly less than that of the surrounding normal liver, indicating effective response to therapy. No current worrisome hypermetabolic lesion is identified in the liver or elsewhere. 2. Chronic asymmetric edema in the subcutaneous tissues of the right upper extremity, nonspecific. The patient has had a prior right mastectomy and right axillary dissection. 3. Other imaging findings of potential clinical significance: Aortic Atherosclerosis (ICD10-I70.0). Coronary atherosclerosis. Mild cardiomegaly. Small right and trace left pleural effusions. Subcutaneous and mild mesenteric edema. Suspected uterine fibroids.   12/18/2019 PET scan   IMPRESSION: 1. No significant abnormal activity to suggest active/recurrent malignancy. 2. Lingual tonsillar activity is mildly increased from prior but probably incidental/physiologic, attention on follow up suggested. 3. Other imaging findings of potential clinical significance: Third spacing of fluid with trace ascites, mesenteric edema, subcutaneous edema, and possibly subtle pulmonary edema. Mild cardiomegaly. Aortic Atherosclerosis (ICD10-I70.0). Coronary atherosclerosis. Right ovarian dermoid. Calcified uterine fibroids. Renal cysts. Right glenohumeral arthropathy. Mild to moderate scoliosis. Suspected pulmonary arterial hypertension.   03/14/2020 PET scan   IMPRESSION: 1.  New small focus of hypermetabolism in segment 4A of the liver worrisome for new metastatic focus. 2. No other findings for metastatic disease involving the neck, chest, abdomen or pelvis.     06/11/2020 PET scan   IMPRESSION: 1. No suspicious hypermetabolic activity within the neck, chest, abdomen or pelvis. 2. The treated dominant peripheral cholangiocarcinoma appears slightly enlarged compared with recent prior studies, and there is a questionable new lesion more superiorly in the left lobe. Although without associated hypermetabolic activity, these lesions are suspicious and would be better assessed with abdominal MRI without and with contrast.   09/17/2020 PET scan   IMPRESSION: 1. Recurrent hypermetabolic activity around the periphery of the dominant hepatic mass consistent with local recurrence. This mass appears slightly larger. Possible small metastasis in the dome of the left hepatic lobe is unchanged in size, without hypermetabolic activity. 2. No distant metastases identified. 3. Stable incidental findings as detailed above.   10/16/2020 -  Chemotherapy    Patient is on Treatment Plan: PANCREAS GEM/CIS Q14D   Patient is on Antibody Plan: BLADDER DURVALUMAB Q28D       CURRENT THERAPY:  1. Maintenance single agent Gemcitabine q2weeks starting on 06/28/19 with C16. On chemo break since 12/27/19. She restarted Single Agent Gemcitabine q2weeks on 04/03/20 due to mild disease progression. 2. Adding imfinzi due to mild progression on 09/26/20 PET scan, delayed due to insurance approval, started 10/16/2020 10 mg/kg and full dose 10/30/20 will be every 4 weeks from that date  INTERVAL HISTORY: Ms. Rosemeyer returns for follow up and treatment as scheduled. She was last seen 11/13/20 and completed another cycle of gemcitabine. Last Imfinzi 10/30/20.  Since last visit she has developed mild intermittent pains in the right axilla.  No palpable mass.  This is similar to when she initially presented with  cholangio carcinoma.  Other existing intermittent abdominal pain is stable.  Neuropathy is unchanged, she heard from someone that baclofen helped them and is interested to try.  All other systems were reviewed with the patient and are negative.  MEDICAL HISTORY:  Past Medical History:  Diagnosis Date  Anemia    Anxiety    Arthritis    Breast cancer ([place]) 1992   right   Cataract    Bilateral eyes - surgery to remove   Chronic diastolic CHF (congestive heart failure) (HCC)    Chronic kidney disease    CKD stage 3   Complication of anesthesia    "something they use make me itch for a couple of days."   Depression    Diabetes mellitus    type 2   Duodenitis 01/18/2002   Fainting spell    GERD (gastroesophageal reflux disease)    Heart murmur    never has caused any problems   Hiatal hernia 08/08/2008, 01/18/2002   History of pneumonia    x 2   Hyperlipidemia    Hypertension    Liver cancer ([place]) 08/2018   chemotherapy   Lymphedema 2017   Right arm   Peripheral neuropathy     SURGICAL HISTORY: Past Surgical History:  Procedure Laterality Date   AXILLARY SURGERY     cyst removal, right   COLONOSCOPY  09/23/2018   Dr. Havery Moros - polyps   EYE SURGERY Bilateral    cataracts to remove   LAPAROSCOPY N/A 11/17/2018   Procedure: LAPAROSCOPY DIAGNOSTIC, INTRAOPERATIVE ULTRASOUND, PERITONEAL BIOPSIES;  Surgeon: [name], MD;  Location: [facility];  Service: General;  Laterality: N/A;  GENERAL AND EPIDURAL   LIVER BIOPSY  08/2018   Dr. Lindwood Coke   MASTECTOMY  1992   right, with flap   NM [place]  06/11/2009   Protocol:[place], post stress EF58%, EKG negative for ischemia, low risk   PORTACATH PLACEMENT N/A 12/02/2018   Procedure: INSERTION PORT-A-CATH;  Surgeon: [name], MD;  Location: [facility];  Service: General;  Laterality: N/A;   RECONSTRUCTION BREAST W/ TRAM FLAP Right    TONSILLECTOMY  1958   TRANSTHORACIC ECHOCARDIOGRAM  12/24/2009   LVEF =>55%, normal  study   UPPER GI ENDOSCOPY      I have reviewed the social history and family history with the patient and they are unchanged from previous note.  ALLERGIES:  has No Known Allergies.  MEDICATIONS:  Current Outpatient Medications  Medication Sig Dispense Refill   amLODipine (NORVASC) 10 MG tablet Take 1 tablet (10 mg total) by mouth daily. 90 tablet 3   aspirin 81 MG tablet Take 81 mg by mouth daily.      Baclofen 5 MG TABS Take half tablet by mouth once daily as needed 14 tablet 0   carvedilol (COREG) 25 MG tablet Take 1 tablet (25 mg total) by mouth 2 (two) times daily with a meal. 180 tablet 3   furosemide (LASIX) 40 MG tablet Take 1 tablet (40 mg total) by mouth daily. 30 tablet 11   gabapentin (NEURONTIN) 100 MG capsule Take 1 capsule (100 mg total) by mouth every 8 (eight) hours as needed. 180 capsule 2   glucose blood test strip 1 each by Other route 2 (two) times daily. Use Onetouch verio test strips as instructed to check blood sugar twice daily. 100 each 2   hydrALAZINE (APRESOLINE) 100 MG tablet TAKE THREE TIMES DAILY-AM, MIDDLE OF THE DAY AND IN THE EVENING. 270 tablet 3   KLOR-CON M10 10 MEQ tablet TAKE 1 TABLET BY MOUTH EVERY DAY 90 tablet 1   lansoprazole (PREVACID) 15 MG capsule Take 1 capsule (15 mg total) by mouth daily at 12 noon. Take 1 capsule by mouth once or twice a day as needed 90 capsule  3   lidocaine-prilocaine (EMLA) cream Apply 1 application topically as needed. 30 g 1   loratadine (CLARITIN) 10 MG tablet Take 10 mg by mouth daily as needed for allergies.     Omega 3 1000 MG CAPS Take 2,000 mg by mouth daily.      ondansetron (ZOFRAN-ODT) 4 MG disintegrating tablet Take 1 tablet (4 mg total) by mouth every 8 (eight) hours as needed for nausea or vomiting. 30 tablet 1   polyethylene glycol (MIRALAX / GLYCOLAX) 17 g packet Take 17 g by mouth daily as needed. 14 each 0   sitaGLIPtin (JANUVIA) 100 MG tablet Take 1 tablet (100 mg total) by mouth daily. 90 tablet 3    valsartan (DIOVAN) 320 MG tablet Take 1 tablet (320 mg total) by mouth daily. 90 tablet 3   Current Facility-Administered Medications  Medication Dose Route Frequency Provider Last Rate Last Admin   triamcinolone acetonide (KENALOG) 10 MG/ML injection 10 mg  10 mg Other Once Harriet Masson, DPM       Facility-Administered Medications Ordered in Other Visits  Medication Dose Route Frequency Provider Last Rate Last Admin   sodium chloride flush (NS) 0.9 % injection 10 mL  10 mL Intracatheter PRN [name], MD   10 mL at 11/27/20 1250    PHYSICAL EXAMINATION: ECOG PERFORMANCE STATUS: 1 - Symptomatic but completely ambulatory  Vitals:   11/27/20 0930  BP: (!) 154/55  Pulse: (!) 55  Resp: 18  Temp: 98 F (36.7 C)  SpO2: 100%   Filed Weights   11/27/20 0930  Weight: 129 lb 11.2 oz (58.8 kg)    GENERAL:alert, no distress and comfortable SKIN: No rash EYES: sclera clear NECK: Without mass LYMPH:  no palpable cervical, supraclavicular, or right axillary lymphadenopathy  LUNGS: clear with normal breathing effort HEART: regular rate & rhythm, no lower extremity edema ABDOMEN:abdomen soft, non-tender and normal bowel sounds NEURO: alert & oriented x 3 with fluent speech Right breast exam: S/p mastectomy with TRAM flap reconstruction.  Incisions completely healed.  No palpable mass or nodularity along the chest wall, reconstructed tissue, or right axilla that I could appreciate.  LABORATORY DATA:  I have reviewed the data as listed CBC Latest Ref Rng & Units 11/27/2020 11/13/2020 10/30/2020  WBC 4.0 - 10.5 K/uL 6.1 7.0 7.1  Hemoglobin 12.0 - 15.0 g/dL 9.4(L) 9.2(L) 9.7(L)  Hematocrit 36.0 - 46.0 % 29.9(L) 28.0(L) 29.2(L)  Platelets 150 - 400 K/uL 195 225 211     CMP Latest Ref Rng & Units 11/27/2020 11/13/2020 10/30/2020  Glucose 70 - 99 mg/dL 110(H) 107(H) 73  BUN 8 - 23 mg/dL 28(H) 28(H) 24(H)  Creatinine 0.44 - 1.00 mg/dL 1.46(H) 1.22(H) 1.44(H)  Sodium 135 - 145 mmol/L 141 145  142  Potassium 3.5 - 5.1 mmol/L 4.2 4.6 4.6  Chloride 98 - 111 mmol/L 108 112(H) 107  CO2 22 - 32 mmol/L $RemoveB'24 24 25  'lvMoTmWT$ Calcium 8.9 - 10.3 mg/dL 9.3 9.1 9.2  Total Protein 6.5 - 8.1 g/dL 7.3 7.4 7.5  Total Bilirubin 0.3 - 1.2 mg/dL 0.3 0.3 0.3  Alkaline Phos 38 - 126 U/L 67 70 69  AST 15 - 41 U/L $Remo'16 18 16  'wYnlP$ ALT 0 - 44 U/L $Remo'8 9 10      'eFFdg$ RADIOGRAPHIC STUDIES: I have personally reviewed the radiological images as listed and agreed with the findings in the report. No results found.   ASSESSMENT & PLAN:   ERLEAN MEALOR is a 80 y.o. female with  1. Intrahepatic cholangiocarcinoma, cT1N0M1, with peritoneal metastasis, MSS, IDH1 mutation (+) -Diagnosed in 09/2018 biopsy of her liver mass shows adenocarcinoma, most consistent with cholangiocarcinoma. 09/29/18 PET scan showed no evidence of distant metastasis. -Unfortunately she was found to have peritoneal metastasis to right diaphragm and surgery was aborted. -Her CT AP from 11/29/18 shows dominate liver mass stable to mild increase, mild nodularity of both adrenal glands which warrants being monitored.  No visible peritoneal metastasis on CT scan. -FO results showed MSI stable disease, IDH mutation positive.  Consider IDH 1 inhibitor in the future.  Not a candidate for immunotherapy -She began first line oxaliplatin (due to CKD) and gemcitabine every 2 weeks on 11/30/2018   -she responded very well to treatment and tolerated without significant toxicities except progressive neuropathy. Oxali was dose reduced and eventually stopped after cycle 11. She received single agent gemcitabine with cycle 12. Cisplatin added with C13 on 05/17/19 but neuropathy worsened and stopped on 06/14/19. -She has been receiving single agent gemcitabine every 2 weeks starting from 06/28/19 C16, tolerating well. She took chemo breast from 10/21 - 1/22 -PET from 03/14/2020 showed concern for new liver lesion, she restarted maintenance gemcitabine every 2 weeks from 04/03/2020.   -06/11/2020 PET showed stable disease -09/26/2020 PET showed recurrent hypermetabolic activity in the dominant liver mass, consistent with recurrent disease -Per new study data Dr. Burr Medico has recommended to add durvalumab with gemcitabine, Due to insurance/drug coverage this delayed the start until 8/3    2.  Grade 3 CIPN -secondary to Oxaliplatin, eventually dc'd after cycle 11 -started cisplatin on 05/17/19, neuropathy worsened in her feet with unsteady gait and functional limitations, decreased vibratory sense on tuning fork exam 05/31/19. Cisplatin was dose reduced then eventually stopped on 06/14/19.  -low efficacy of gabapentin and cymbalta, also tried lyrica briefly.  She completed PT for balance. Ambulates with cane to avoid fall -She has functional difficulties and ambulates with a cane -Follow-up open with Dr. Mickeal Skinner, pt did not find helpful. Also seen by chiropractor -Continues to have decreased function, on gabapentin 100 mg as needed -She went to chronic conditions center and was offered a type of treatment that would be $6000, she declined -Ineligible for [facility] [place] 2102 neuropathy clinical trial -She feels CIPN worsened slightly on Imfinzi, no functional changes.  She prefers to continue. -Reviewed "Nervine" has DDI's with gemcitabine (potential risk for liver damage) and BP meds (risk for hypotension). I discussed this with pt today   3. H/o right breast cancer, Genetics negative  -s/p mastectomy in 1992, per patient did not require adjuvant therapy -previously followed by Dr. Jana Hakim  -VUS of gene APC; genetics otherwise normal   4. Comorbidities: DM, HTN, hiatal hernia, GERD, renal artery stenosis, CHF -Follow-up with PCP, nephrology, and Dr. Dwyane Dee, and cardiology -stable, well controlled    5. Family support -she has a son and daughter who are supportive.  Son is the former Engineer, petroleum -patient remains independent, ambulates with a cane   6. Goal of care  discussion -She understands the treatment goal is palliative, to control her disease, prolong her life, and give her good quality of life.  -she is full code now    7. CKD Stage III -continue monitoring Scr 1.4 - 1.7  Disposition: Ms. Cathell appears stable.  She continues every 2 week gemcitabine and every 4 week durvalumab.  She tolerates treatment well with stable neuropathy. I do not recommend "Nervine" supplement due to DDIs specifically with anti-hypertensives and gemcitabine. Will  trial low dose baclofen, she is aware of potential CNS side effects and will avoid use with gabapentin. Otherwise able to recover and function well.   She has developed recurrent right axillary pain. Exam is benign. She had remote mastectomy with reconstruction for right breast cancer in 1992. We discussed observation vs imaging, she prefers to pursue work up. I have very low suspicion this is related to previous breast cancer. We discussed the possibility this is referred pain from liver. She is being referred for right ax [place], I will call her with results.   Labs reviewed, adequate to proceed with gemcitabine and durvalumab today as planned. F/up and gemcitabine in 2 weeks.    Orders Placed This Encounter  Procedures   [place] AXILLA RIGHT    Standing Status:   Future    Standing Expiration Date:   11/27/2021    Scheduling Instructions:     [facility]    Order Specific Question:   Reason for Exam (SYMPTOM  OR DIAGNOSIS REQUIRED)    Answer:   right axillary pain. h/o remote R breast cancer 1992 s/p mastectomy and reconstruction    Order Specific Question:   Preferred imaging location?    Answer:   External    All questions were answered. The patient knows to call the clinic with any problems, questions or concerns. No barriers to learning were detected.  Total encounter time was 30 minutes.     Alla Feeling, NP 11/27/20

## 2020-11-27 ENCOUNTER — Inpatient Hospital Stay: Payer: Medicare Other

## 2020-11-27 ENCOUNTER — Other Ambulatory Visit: Payer: Self-pay

## 2020-11-27 ENCOUNTER — Inpatient Hospital Stay: Payer: Medicare Other | Attending: Nurse Practitioner

## 2020-11-27 ENCOUNTER — Encounter: Payer: Self-pay | Admitting: Nurse Practitioner

## 2020-11-27 ENCOUNTER — Inpatient Hospital Stay (HOSPITAL_BASED_OUTPATIENT_CLINIC_OR_DEPARTMENT_OTHER): Payer: Medicare Other | Admitting: Nurse Practitioner

## 2020-11-27 VITALS — BP 154/55 | HR 55 | Temp 98.0°F | Resp 18 | Ht 64.0 in | Wt 129.7 lb

## 2020-11-27 DIAGNOSIS — Z5111 Encounter for antineoplastic chemotherapy: Secondary | ICD-10-CM | POA: Insufficient documentation

## 2020-11-27 DIAGNOSIS — C221 Intrahepatic bile duct carcinoma: Secondary | ICD-10-CM

## 2020-11-27 DIAGNOSIS — G620 Drug-induced polyneuropathy: Secondary | ICD-10-CM

## 2020-11-27 DIAGNOSIS — C786 Secondary malignant neoplasm of retroperitoneum and peritoneum: Secondary | ICD-10-CM | POA: Diagnosis not present

## 2020-11-27 DIAGNOSIS — Z79899 Other long term (current) drug therapy: Secondary | ICD-10-CM | POA: Diagnosis not present

## 2020-11-27 DIAGNOSIS — Z5112 Encounter for antineoplastic immunotherapy: Secondary | ICD-10-CM | POA: Insufficient documentation

## 2020-11-27 DIAGNOSIS — N183 Chronic kidney disease, stage 3 unspecified: Secondary | ICD-10-CM | POA: Insufficient documentation

## 2020-11-27 DIAGNOSIS — T451X5A Adverse effect of antineoplastic and immunosuppressive drugs, initial encounter: Secondary | ICD-10-CM | POA: Diagnosis not present

## 2020-11-27 DIAGNOSIS — M79621 Pain in right upper arm: Secondary | ICD-10-CM

## 2020-11-27 DIAGNOSIS — Z95828 Presence of other vascular implants and grafts: Secondary | ICD-10-CM

## 2020-11-27 LAB — CMP (CANCER CENTER ONLY)
ALT: 8 U/L (ref 0–44)
AST: 16 U/L (ref 15–41)
Albumin: 3.3 g/dL — ABNORMAL LOW (ref 3.5–5.0)
Alkaline Phosphatase: 67 U/L (ref 38–126)
Anion gap: 9 (ref 5–15)
BUN: 28 mg/dL — ABNORMAL HIGH (ref 8–23)
CO2: 24 mmol/L (ref 22–32)
Calcium: 9.3 mg/dL (ref 8.9–10.3)
Chloride: 108 mmol/L (ref 98–111)
Creatinine: 1.46 mg/dL — ABNORMAL HIGH (ref 0.44–1.00)
GFR, Estimated: 36 mL/min — ABNORMAL LOW (ref >60)
Glucose, Bld: 110 mg/dL — ABNORMAL HIGH (ref 70–99)
Potassium: 4.2 mmol/L (ref 3.5–5.1)
Sodium: 141 mmol/L (ref 135–145)
Total Bilirubin: 0.3 mg/dL (ref 0.3–1.2)
Total Protein: 7.3 g/dL (ref 6.5–8.1)

## 2020-11-27 LAB — CBC WITH DIFFERENTIAL (CANCER CENTER ONLY)
Abs Immature Granulocytes: 0.02 10*3/uL (ref 0.00–0.07)
Basophils Absolute: 0 10*3/uL (ref 0.0–0.1)
Basophils Relative: 1 %
Eosinophils Absolute: 0.5 10*3/uL (ref 0.0–0.5)
Eosinophils Relative: 8 %
HCT: 29.9 % — ABNORMAL LOW (ref 36.0–46.0)
Hemoglobin: 9.4 g/dL — ABNORMAL LOW (ref 12.0–15.0)
Immature Granulocytes: 0 %
Lymphocytes Relative: 16 %
Lymphs Abs: 1 10*3/uL (ref 0.7–4.0)
MCH: 31.2 pg (ref 26.0–34.0)
MCHC: 31.4 g/dL (ref 30.0–36.0)
MCV: 99.3 fL (ref 80.0–100.0)
Monocytes Absolute: 1 10*3/uL (ref 0.1–1.0)
Monocytes Relative: 16 %
Neutro Abs: 3.7 10*3/uL (ref 1.7–7.7)
Neutrophils Relative %: 59 %
Platelet Count: 195 10*3/uL (ref 150–400)
RBC: 3.01 MIL/uL — ABNORMAL LOW (ref 3.87–5.11)
RDW: 16.4 % — ABNORMAL HIGH (ref 11.5–15.5)
WBC Count: 6.1 10*3/uL (ref 4.0–10.5)
nRBC: 0 % (ref 0.0–0.2)

## 2020-11-27 MED ORDER — SODIUM CHLORIDE 0.9% FLUSH
10.0000 mL | INTRAVENOUS | Status: DC | PRN
  Administered 2020-11-27: 10 mL

## 2020-11-27 MED ORDER — HEPARIN SOD (PORK) LOCK FLUSH 100 UNIT/ML IV SOLN
500.0000 [IU] | Freq: Once | INTRAVENOUS | Status: AC | PRN
  Administered 2020-11-27: 500 [IU]

## 2020-11-27 MED ORDER — SODIUM CHLORIDE 0.9% FLUSH
10.0000 mL | Freq: Once | INTRAVENOUS | Status: AC
  Administered 2020-11-27: 10 mL

## 2020-11-27 MED ORDER — PROCHLORPERAZINE MALEATE 10 MG PO TABS
10.0000 mg | ORAL_TABLET | Freq: Once | ORAL | Status: AC
  Administered 2020-11-27: 10 mg via ORAL
  Filled 2020-11-27: qty 1

## 2020-11-27 MED ORDER — SODIUM CHLORIDE 0.9 % IV SOLN
1500.0000 mg | Freq: Once | INTRAVENOUS | Status: AC
  Administered 2020-11-27: 1500 mg via INTRAVENOUS
  Filled 2020-11-27: qty 30

## 2020-11-27 MED ORDER — BACLOFEN 5 MG PO TABS: ORAL_TABLET | ORAL | 0 refills | Status: DC

## 2020-11-27 MED ORDER — SODIUM CHLORIDE 0.9 % IV SOLN: Freq: Once | INTRAVENOUS | Status: AC

## 2020-11-27 MED ORDER — SODIUM CHLORIDE 0.9 % IV SOLN
600.0000 mg/m2 | Freq: Once | INTRAVENOUS | Status: AC
  Administered 2020-11-27: 988 mg via INTRAVENOUS
  Filled 2020-11-27: qty 25.98

## 2020-11-27 NOTE — Patient Instructions (Signed)
[facility]  Discharge Instructions: Thank you for choosing [facility] to provide your oncology and hematology care.   If you have a lab appointment with the [facility], please go directly to the [facility] and check in at the registration area.   Wear comfortable clothing and clothing appropriate for easy access to any Portacath or PICC line.   We strive to give you quality time with your provider. You may need to reschedule your appointment if you arrive late (15 or more minutes).  Arriving late affects you and other patients whose appointments are after yours.  Also, if you miss three or more appointments without notifying the office, you may be dismissed from the clinic at the provider's discretion.      For prescription refill requests, have your pharmacy contact our office and allow 72 hours for refills to be completed.    Today you received the following chemotherapy and/or immunotherapy agents Gemzar and imfinzi      To help prevent nausea and vomiting after your treatment, we encourage you to take your nausea medication as directed.  BELOW ARE SYMPTOMS THAT SHOULD BE REPORTED IMMEDIATELY: *FEVER GREATER THAN 100.4 F (38 C) OR HIGHER *CHILLS OR SWEATING *NAUSEA AND VOMITING THAT IS NOT CONTROLLED WITH YOUR NAUSEA MEDICATION *UNUSUAL SHORTNESS OF BREATH *UNUSUAL BRUISING OR BLEEDING *URINARY PROBLEMS (pain or burning when urinating, or frequent urination) *BOWEL PROBLEMS (unusual diarrhea, constipation, pain near the anus) TENDERNESS IN MOUTH AND THROAT WITH OR WITHOUT PRESENCE OF ULCERS (sore throat, sores in mouth, or a toothache) UNUSUAL RASH, SWELLING OR PAIN  UNUSUAL VAGINAL DISCHARGE OR ITCHING   Items with * indicate a potential emergency and should be followed up as soon as possible or go to the Emergency Department if any problems should occur.  Please show the CHEMOTHERAPY ALERT CARD or IMMUNOTHERAPY ALERT CARD at  check-in to the Emergency Department and triage nurse.  Should you have questions after your visit or need to cancel or reschedule your appointment, please contact [facility]  Dept: [phone]  and follow the prompts.  Office hours are 8:00 a.m. to 4:30 p.m. Monday - Friday. Please note that voicemails left after 4:00 p.m. may not be returned until the following business day.  We are closed weekends and major holidays. You have access to a nurse at all times for urgent questions. Please call the main number to the clinic Dept: [phone] and follow the prompts.   For any non-urgent questions, you may also contact your provider using MyChart. We now offer e-Visits for anyone 61 and older to request care online for non-urgent symptoms. For details visit [email or web address].   Also download the MyChart app! Go to the app store, search "MyChart", open the app, select [facility], and log in with your MyChart username and password.  Due to Covid, a mask is required upon entering the hospital/clinic. If you do not have a mask, one will be given to you upon arrival. For doctor visits, patients may have 1 support person aged 1 or older with them. For treatment visits, patients cannot have anyone with them due to current Covid guidelines and our immunocompromised population.

## 2020-12-03 ENCOUNTER — Telehealth: Payer: Self-pay | Admitting: Hematology

## 2020-12-03 NOTE — Telephone Encounter (Signed)
Scheduled follow-up appointments per 9/14 los. Patient is aware. 

## 2020-12-04 ENCOUNTER — Other Ambulatory Visit: Payer: Self-pay | Admitting: Hematology

## 2020-12-10 NOTE — Progress Notes (Signed)
[place]   [phone] [phone]   Clinic Follow up Note   Patient Care Team: [name], MD as PCP - General (Family Medicine) [name], [name], MD as PCP - Cardiology (Cardiology) [name], [name], MD as Consulting Physician (Cardiology) [name], MD as Consulting Physician (Obstetrics and Gynecology) [name], [name], NP as Nurse Practitioner (Hematology and Oncology) [facility] [name], MD as Consulting Physician (Hematology) [name], [name], MD as Consulting Physician (Gastroenterology) [name], RN (Inactive) as Oncology Nurse Navigator [name], MD as Consulting Physician (General Surgery)  Date of Service:  12/10/2020  CHIEF COMPLAINT: f/u of cholangiocarcinoma  ASSESSMENT & PLAN:  Donna May is a 80 y.o. female with   1. Intrahepatic cholangiocarcinoma, cT1N0M1, with peritoneal metastasis, MSS, IDH1 mutation (+) -Diagnosed in 09/2018 biopsy of her liver mass shows adenocarcinoma, most consistent with cholangiocarcinoma. 09/29/18 PET scan showed no evidence of distant metastasis. -Unfortunately she was found to have peritoneal metastasis to right diaphragm and surgery was aborted. -Her CT AP from 11/29/18 shows dominate liver mass stable to mild increase, mild nodularity of both adrenal glands which warrants being monitored.  No visible peritoneal metastasis on CT scan. -FO results showed MSI stable disease, IDH mutation positive.  Consider IDH 1 inhibitor in the future.  Not a candidate for immunotherapy -She began first line oxaliplatin (due to CKD) and gemcitabine every 2 weeks on 11/30/2018   -she responded very well to treatment and tolerated without significant toxicities except progressive neuropathy. Oxali was dose reduced and eventually stopped after cycle 11. She received single agent gemcitabine with cycle 12. Cisplatin added with C13 on 05/17/19 but neuropathy worsened and  stopped on 06/14/19. -She has been receiving single agent gemcitabine every 2 weeks starting from 06/28/19 C16, tolerating well. She took chemo break from 10/21 - 1/22 -PET from 03/14/2020 showed concern for disease progression in liver, she restarted maintenance gemcitabine every 2 weeks from 04/03/2020. -09/26/2020 PET showed recurrent hypermetabolic activity in the dominant liver mass, consistent with recurrent disease -I added durvalumab to gemcitabine, she is tolerating well  -She is tolerating chemotherapy well overall, although she is very upset about her neuropathy.  I again had a discussion about continuing chemotherapy, versus stopping since she made statement today "  I hate to come here".  After discussion, patient prefers to continue chemo treatment for now, she is very worried about cancer progression. -repeat PET scan in 2 weeks   2.  Grade 3 CIPN -secondary to Oxaliplatin, eventually dc'd after cycle 11 -started cisplatin on 05/17/19, neuropathy worsened in her feet with unsteady gait and functional limitations, decreased vibratory sense on tuning fork exam 05/31/19. Cisplatin was dose reduced then eventually stopped on 06/14/19.  -low efficacy of gabapentin and cymbalta, also tried lyrica briefly.  She completed PT for balance. Ambulates with cane to avoid fall -She has functional difficulties and ambulates with a cane -Follow-up open with Dr. Mickeal Skinner, pt did not find helpful. Also seen by chiropractor -Continues to have decreased function, on gabapentin 100 mg as needed -She went to chronic conditions center and was offered a type of treatment that would be $6000, she declined -Ineligible for ACCRU [place] 2102 neuropathy clinical trial, I suggest her to take PEA off trial  -She does not take much Neurontin due to the side effect of drowsiness, I discussed option of Cymbalta, she does not remember if she was on before.  She is willing to try again.   3.  H/o right breast cancer, Genetics  negative  -s/p mastectomy in 1992, per patient did not require adjuvant therapy -previously followed by Dr. Jana Hakim  -VUS of gene APC; genetics otherwise normal   4. Comorbidities: DM, HTN, hiatal hernia, GERD, renal artery stenosis, CHF -Follow-up with PCP, nephrology, and Dr. Dwyane Dee, and cardiology -stable, well controlled -Her BP was elevated today. She endorses taking her medication as directed. She notes it is related to the stress of being here.   5. Family support -she has a son and daughter who are supportive. -patient remains independent    6. Goal of care discussion -We frequently discuss the goal of her treatment is palliative and that her cancer is not curable with chemo alone at this stage. The goal is to control her disease, prolong her life, and give her good quality of life. She understands -she is full code now    7. CKD Stage III -continue monitoring Scr 1.4 - 1.7   PLAN: -proceed with gemcitabine today and in 2 and 4 weeks -Continue daratumumab every 4 weeks, next due in 2 weeks -restaging PET scan before next visit in 2 weeks    SUMMARY OF ONCOLOGIC HISTORY: Oncology History Overview Note  Cancer Staging Intrahepatic cholangiocarcinoma ([place]) Staging form: Intrahepatic Bile Duct, AJCC 8th Edition - Clinical stage from 09/23/2018: Stage IB (cT1b, cN0, cM0) - Signed by [name], MD on 10/06/2018    Intrahepatic cholangiocarcinoma ([place])  09/07/2018 Imaging   CT Chest IMPRESSION: 1. New, enhancing mass involving segment 4 of the liver and fundus of gallbladder is concerning for malignancy. This may represent either metastatic disease from breast cancer or neoplasm primary to the liver or hepatic biliary tree. Further evaluation with contrast enhanced CT of the abdomen and pelvis is recommended. 2. No findings to suggest metastatic disease within the chest. 3.  Aortic Atherosclerosis (ICD10-I70.0). 4. Coronary artery calcifications.   09/13/2018 Pathology  Results   Diagnosis Liver, needle/core biopsy - ADENOCARCINOMA. Microscopic Comment Immunohistochemistry for CK7 is positive. CK20, TTF1, CDX-2, GATA-3, PAX 8, Qualitative ER, p63 and CK5/6 are negative. The provided clinical history of remote mammary carcinoma is noted. Based on the morphology and immunophenotype of the adenocarcinoma observed in this specimen, primary cholangiocarcinoma is favored. Clinical and radiologic correlation are  encouraged. Results reported to [employer] on 09/15/2018. Intradepartmental consultation (Dr. Vic Ripper).   09/13/2018 Initial Diagnosis   Cholangiocarcinoma ([place])   09/23/2018 Procedure   Colonoscopy by Dr. Havery Moros 09/23/18  IMPRESSION - Two 3 to 4 mm polyps in the ascending colon, removed with a cold snare. Resected and retrieved. - Five 3 to 5 mm polyps in the transverse colon, removed with a cold snare. Resected and retrieved. - One 5 mm polyp at the splenic flexure, removed with a cold snare. Resected and retrieved. - Three 3 to 5 mm polyps in the sigmoid colon, removed with a cold snare. Resected and retrieved. - The examination was otherwise normal. Upper Endopscy by Dr. Havery Moros 09/23/18  IMPRESSION - Esophagogastric landmarks identified. - 2 cm hiatal hernia. - Normal esophagus otherwise. - A single gastric polyp. Resected and retrieved. - Mild gastritis. Biopsied. - Normal duodenal bulb and second portion of the duodenum.   09/23/2018 Pathology Results   Diagnosis 09/23/18 1. Surgical [P], duodenum - BENIGN SMALL BOWEL MUCOSA. - NO ACTIVE INFLAMMATION OR VILLOUS ATROPHY IDENTIFIED. 2. Surgical [P], stomach, polyp - HYPERPLASTIC POLYP(S). - THERE IS NO EVIDENCE OF MALIGNANCY. 3. Surgical [P], gastric antrum and gastric body - CHRONIC INACTIVE GASTRITIS. -  THERE IS NO EVIDENCE OF HELICOBACTER-PYLORI, DYSPLASIA, OR MALIGNANCY. - SEE COMMENT. 4. Surgical [P], colon, sigmoid, splenic flexure, transverse and ascending, polyp (9) -  TUBULAR ADENOMA(S). - SESSILE SERRATED POLYP WITHOUT CYTOLOGIC DYSPLASIA. - HIGH GRADE DYSPLASIA IS NOT IDENTIFIED. 5. Surgical [P], colon, sigmoid, polyp (2) - HYPERPLASTIC POLYP(S). - THERE IS NO EVIDENCE OF MALIGNANCY.   09/23/2018 Cancer Staging   Staging form: Intrahepatic Bile Duct, AJCC 8th Edition - Clinical stage from 09/23/2018: Stage IB (cT1b, cN0, cM0) - Signed by [name], MD on 10/06/2018   09/29/2018 PET scan   PET 09/29/18 IMPRESSION: 1. Hypermetabolic mass in the RIGHT hepatic lobe consistent with biopsy proven adenocarcinoma. No additional liver metastasis. 2. No evidence of local breast cancer recurrence in the RIGHT breast or RIGHT axilla. 3. Mild bilateral hypermetabolic adrenal glands is favored benign hyperplasia. 4. No evidence of additional metastatic disease on skull base to thigh FDG PET scan.   11/06/2018 Genetic Testing   Negative genetic testing on the [facility] Common Hereditary Cancers panel. A variant of uncertain significance was identified in one of her APC genes, called c.1243G>A (p.Ala415Thr).  The Common Hereditary Cancers Panel offered by [facility] includes sequencing and/or deletion duplication testing of the following 48 genes: APC, ATM, AXIN2, BARD1, BMPR1A, BRCA1, BRCA2, BRIP1, CDH1, CDK4, CDKN2A (p14ARF), CDKN2A (p16INK4a), CHEK2, CTNNA1, DICER1, EPCAM (Deletion/duplication testing only), GREM1 (promoter region deletion/duplication testing only), KIT, MEN1, MLH1, MSH2, MSH3, MSH6, MUTYH, NBN, NF1, NHTL1, PALB2, PDGFRA, PMS2, POLD1, POLE, PTEN, RAD50, RAD51C, RAD51D, RNF43, SDHB, SDHC, SDHD, SMAD4, SMARCA4. STK11, TP53, TSC1, TSC2, and VHL.  The following genes were evaluated for sequence changes only: SDHA and HOXB13 c.251G>A variant only.    11/17/2018 Pathology Results   Diagnosis 11/17/18 1. Soft tissue, biopsy, Diaphragmatic nodules - METASTATIC ADENOCARCINOMA, CONSISTENT WITH PATIENT'S CLINICAL HISTORY OF CHOLANGIOCARCINOMA. SEE NOTE 2. Liver,  biopsy, Left - LIVER PARENCHYMA WITH A BENIGN FIBROTIC NODULE - NO EVIDENCE OF MALIGNANCY 3. Stomach, biopsy - BENIGN PAPILLARY MESOTHELIAL HYPERPLASIA - NO EVIDENCE OF MALIGNANCY   11/29/2018 Imaging   CT CAP WO Contrast  IMPRESSION: 1. Dominant liver mass appears grossly stable from 09/29/2018. Additional liver lesions are too small to characterize but were not shown to be hypermetabolic on PET. 2. Mild nodularity of both adrenal glands with associated hypermetabolism on 09/29/2018. Continued attention on follow-up exams is warranted. 3. Small right lower lobe nodules, stable from 09/07/2018. Again, attention on follow-up is recommended. 4. Trace bilateral pleural fluid. 5. Aortic atherosclerosis (ICD10-170.0). Coronary artery calcification. 6. Enlarged pulmonic trunk, indicative of pulmonary arterial hypertension.     11/30/2018 - 06/14/2019 Chemotherapy   First line chemo Oxaliplatin and gemcitabine q2weeks starting 11/30/18. Stopped Oxaliplatin on 05/03/19 due to worsening neuropathy. Added Cisplatin with C13 on 05/17/19 and stopped on C15 06/14/19 due to neuropathy. Reduced to maintenance single agent Gemcitabine on 06/28/19.    01/20/2019 Imaging   CT CAP IMPRESSION: restaging  1. Mild interval increase in size of dominant liver mass involving segment 4 and segment 5. 2. No significant or progressive adrenal nodularity identified to suggest metastatic disease. 3. Unchanged appearance of small right lower lobe and lingular lung nodules, nonspecific.   03/21/2019 PET scan   IMPRESSION: 1. Large right hepatic mass with SUV uptake near background hepatic activity, dramatic response to therapy, also with decrease in size when compared to the prior study. 2. Signs of prior right mastectomy and axillary dissection as before. 3. Left adrenal activity in no longer above the level of activity seen in  the contralateral, right adrenal gland or in the liver. Attention on follow-up. 4. No  new signs of disease.   06/27/2019 PET scan   IMPRESSION: 1. Further decrease in size of a dominant hepatic mass which is non FDG avid. 2. No evidence of metastatic disease. 3. No evidence of left adrenal hypermetabolism or mass. 4. Incidental findings, including: Uterine fibroids. Coronary artery atherosclerosis. Aortic Atherosclerosis (ICD10-I70.0). Pulmonary artery enlargement suggests pulmonary arterial hypertension.   06/28/2019 -  Chemotherapy   Maintenance single agent Gemcitabine q2weeks starting on 06/28/19 with C16.  ------On chemo break since 12/27/19.  ------She restarted Single Agent Gemcitabine q2weeks on 04/03/20 due to mild disease progression in liver on 02/2020 PET    09/15/2019 PET scan   IMPRESSION: 1. In the region of regional tumor at the junction of the right and left hepatic lobe, there is continued hypodensity and overall activity slightly less than that of the surrounding normal liver, indicating effective response to therapy. No current worrisome hypermetabolic lesion is identified in the liver or elsewhere. 2. Chronic asymmetric edema in the subcutaneous tissues of the right upper extremity, nonspecific. The patient has had a prior right mastectomy and right axillary dissection. 3. Other imaging findings of potential clinical significance: Aortic Atherosclerosis (ICD10-I70.0). Coronary atherosclerosis. Mild cardiomegaly. Small right and trace left pleural effusions. Subcutaneous and mild mesenteric edema. Suspected uterine fibroids.   12/18/2019 PET scan   IMPRESSION: 1. No significant abnormal activity to suggest active/recurrent malignancy. 2. Lingual tonsillar activity is mildly increased from prior but probably incidental/physiologic, attention on follow up suggested. 3. Other imaging findings of potential clinical significance: Third spacing of fluid with trace ascites, mesenteric edema, subcutaneous edema, and possibly subtle pulmonary edema. Mild  cardiomegaly. Aortic Atherosclerosis (ICD10-I70.0). Coronary atherosclerosis. Right ovarian dermoid. Calcified uterine fibroids. Renal cysts. Right glenohumeral arthropathy. Mild to moderate scoliosis. Suspected pulmonary arterial hypertension.   03/14/2020 PET scan   IMPRESSION: 1. New small focus of hypermetabolism in segment 4A of the liver worrisome for new metastatic focus. 2. No other findings for metastatic disease involving the neck, chest, abdomen or pelvis.     06/11/2020 PET scan   IMPRESSION: 1. No suspicious hypermetabolic activity within the neck, chest, abdomen or pelvis. 2. The treated dominant peripheral cholangiocarcinoma appears slightly enlarged compared with recent prior studies, and there is a questionable new lesion more superiorly in the left lobe. Although without associated hypermetabolic activity, these lesions are suspicious and would be better assessed with abdominal MRI without and with contrast.   09/17/2020 PET scan   IMPRESSION: 1. Recurrent hypermetabolic activity around the periphery of the dominant hepatic mass consistent with local recurrence. This mass appears slightly larger. Possible small metastasis in the dome of the left hepatic lobe is unchanged in size, without hypermetabolic activity. 2. No distant metastases identified. 3. Stable incidental findings as detailed above.   10/16/2020 -  Chemotherapy    Patient is on Treatment Plan: PANCREAS GEM/CIS Q14D   Patient is on Antibody Plan: BLADDER DURVALUMAB Q28D        CURRENT THERAPY:  1. Maintenance single agent Gemcitabine q2weeks starting on 06/28/19 with C16. On chemo break since 12/27/19. She restarted Single Agent Gemcitabine q2weeks on 04/03/20 due to mild disease progression. 2. Adding imfinzi due to mild progression on 09/26/20 PET scan, every 4 weeks   INTERVAL HISTORY:  Donna May is here for a follow up of cholangio. She was last seen by me on 8/3, and by NP Lacie in the interim.  She  presents to the clinic alone. Her main concern is to her neuropathy, she is very frustrated that she is not able to certain things due to neuropathy.  No balance issue or fall, she still lives alone independently. " I hate to come here", she feels her QOL is poor and it's related to chemo  Her appetite is stable, weight is stable.   All other systems were reviewed with the patient and are negative.  MEDICAL HISTORY:  Past Medical History:  Diagnosis Date   Anemia    Anxiety    Arthritis    Breast cancer ([place]) 1992   right   Cataract    Bilateral eyes - surgery to remove   Chronic diastolic CHF (congestive heart failure) (H[facility])    Chronic kidney disease    CKD stage 3   Complication of anesthesia    "something they use make me itch for a couple of days."   Depression    Diabetes mellitus    type 2   Duodenitis 01/18/2002   Fainting spell    GERD (gastroesophageal reflux disease)    Heart murmur    never has caused any problems   Hiatal hernia 08/08/2008, 01/18/2002   History of pneumonia    x 2   Hyperlipidemia    Hypertension    Liver cancer ([facility]) 08/2018   chemotherapy   Lymphedema 2017   Right arm   Peripheral neuropathy     SURGICAL HISTORY: Past Surgical History:  Procedure Laterality Date   AXILLARY SURGERY     cyst removal, right   COLONOSCOPY  09/23/2018   Dr. Havery Moros - polyps   EYE SURGERY Bilateral    cataracts to remove   LAPAROSCOPY N/A 11/17/2018   Procedure: LAPAROSCOPY DIAGNOSTIC, INTRAOPERATIVE ULTRASOUND, PERITONEAL BIOPSIES;  Surgeon: [name], MD;  Location: [facility];  Service: General;  Laterality: N/A;  GENERAL AND EPIDURAL   LIVER BIOPSY  08/2018   Dr. Lindwood Coke   MASTECTOMY  1992   right, with flap   NM [place]  06/11/2009   Protocol:[place], post stress EF58%, EKG negative for ischemia, low risk   PORTACATH PLACEMENT N/A 12/02/2018   Procedure: INSERTION PORT-A-CATH;  Surgeon: [name], MD;  Location: [facility];   Service: General;  Laterality: N/A;   RECONSTRUCTION BREAST W/ TRAM FLAP Right    TONSILLECTOMY  1958   TRANSTHORACIC ECHOCARDIOGRAM  12/24/2009   LVEF =>55%, normal study   UPPER GI ENDOSCOPY      I have reviewed the social history and family history with the patient and they are unchanged from previous note.  ALLERGIES:  has No Known Allergies.  MEDICATIONS:  Current Outpatient Medications  Medication Sig Dispense Refill   amLODipine (NORVASC) 10 MG tablet Take 1 tablet (10 mg total) by mouth daily. 90 tablet 3   aspirin 81 MG tablet Take 81 mg by mouth daily.      Baclofen 5 MG TABS Take half tablet by mouth once daily as needed 14 tablet 0   carvedilol (COREG) 25 MG tablet Take 1 tablet (25 mg total) by mouth 2 (two) times daily with a meal. 180 tablet 3   furosemide (LASIX) 40 MG tablet Take 1 tablet (40 mg total) by mouth daily. 30 tablet 11   gabapentin (NEURONTIN) 100 MG capsule Take 1 capsule (100 mg total) by mouth every 8 (eight) hours as needed. 180 capsule 2   glucose blood test strip 1 each by Other route 2 (two) times daily.  Use Onetouch verio test strips as instructed to check blood sugar twice daily. 100 each 2   hydrALAZINE (APRESOLINE) 100 MG tablet TAKE THREE TIMES DAILY-AM, MIDDLE OF THE DAY AND IN THE EVENING. 270 tablet 3   KLOR-CON M10 10 MEQ tablet TAKE 1 TABLET BY MOUTH EVERY DAY 90 tablet 1   lansoprazole (PREVACID) 15 MG capsule Take 1 capsule (15 mg total) by mouth daily at 12 noon. Take 1 capsule by mouth once or twice a day as needed 90 capsule 3   lidocaine-prilocaine (EMLA) cream Apply 1 application topically as needed. 30 g 1   loratadine (CLARITIN) 10 MG tablet Take 10 mg by mouth daily as needed for allergies.     Omega 3 1000 MG CAPS Take 2,000 mg by mouth daily.      ondansetron (ZOFRAN-ODT) 4 MG disintegrating tablet Take 1 tablet (4 mg total) by mouth every 8 (eight) hours as needed for nausea or vomiting. 30 tablet 1   polyethylene glycol (MIRALAX  / GLYCOLAX) 17 g packet Take 17 g by mouth daily as needed. 14 each 0   sitaGLIPtin (JANUVIA) 100 MG tablet Take 1 tablet (100 mg total) by mouth daily. 90 tablet 3   valsartan (DIOVAN) 320 MG tablet Take 1 tablet (320 mg total) by mouth daily. 90 tablet 3   Current Facility-Administered Medications  Medication Dose Route Frequency Provider Last Rate Last Admin   triamcinolone acetonide (KENALOG) 10 MG/ML injection 10 mg  10 mg Other Once Harriet Masson, DPM        PHYSICAL EXAMINATION: ECOG PERFORMANCE STATUS: 2 - Symptomatic, <50% confined to bed  There were no vitals filed for this visit.  Wt Readings from Last 3 Encounters:  11/27/20 129 lb 11.2 oz (58.8 kg)  11/13/20 128 lb 12.8 oz (58.4 kg)  11/01/20 127 lb 2 oz (57.7 kg)    GENERAL:alert, no distress and comfortable SKIN: skin color, texture, turgor are normal, no rashes or significant lesions EYES: normal, Conjunctiva are pink and non-injected, sclera clear  NECK: supple, thyroid normal size, non-tender, without nodularity LYMPH:  no palpable lymphadenopathy in the cervical, axillary  LUNGS: clear to auscultation and percussion with normal breathing effort HEART: regular rate & rhythm and no murmurs and no lower extremity edema ABDOMEN:abdomen soft, non-tender and normal bowel sounds Musculoskeletal:no cyanosis of digits and no clubbing  NEURO: alert & oriented x 3 with fluent speech, no focal motor/sensory deficits  LABORATORY DATA:  I have reviewed the data as listed CBC Latest Ref Rng & Units 11/27/2020 11/13/2020 10/30/2020  WBC 4.0 - 10.5 K/uL 6.1 7.0 7.1  Hemoglobin 12.0 - 15.0 g/dL 9.4(L) 9.2(L) 9.7(L)  Hematocrit 36.0 - 46.0 % 29.9(L) 28.0(L) 29.2(L)  Platelets 150 - 400 K/uL 195 225 211     CMP Latest Ref Rng & Units 11/27/2020 11/13/2020 10/30/2020  Glucose 70 - 99 mg/dL 110(H) 107(H) 73  BUN 8 - 23 mg/dL 28(H) 28(H) 24(H)  Creatinine 0.44 - 1.00 mg/dL 1.46(H) 1.22(H) 1.44(H)  Sodium 135 - 145 mmol/L 141 145  142  Potassium 3.5 - 5.1 mmol/L 4.2 4.6 4.6  Chloride 98 - 111 mmol/L 108 112(H) 107  CO2 22 - 32 mmol/L _0 Calcium 8.9 - 10.3 mg/dL 9.3 9.1 9.2  Total Protein 6.5 - 8.1 g/dL 7.3 7.4 7.5  Total Bilirubin 0.3 - 1.2 mg/dL 0.3 0.3 0.3  Alkaline Phos 38 - 126 U/L 67 70 69  AST 15 - 41 U/L 16 18 16  ALT 0 - 44 U/L _0 RADIOGRAPHIC STUDIES: I have personally reviewed the radiological images as listed and agreed with the findings in the report. No results found.    No problem-specific Assessment & Plan notes found for this encounter.   No orders of the defined types were placed in this encounter.  All questions were answered. The patient knows to call the clinic with any problems, questions or concerns. No barriers to learning was detected. The total time spent in the appointment was 30 minutes.     [name], MD 12/10/2020   I, Wilburn Mylar, am acting as scribe for [name], MD.   I have reviewed the above documentation for accuracy and completeness, and I agree with the above.

## 2020-12-11 ENCOUNTER — Other Ambulatory Visit: Payer: Self-pay

## 2020-12-11 ENCOUNTER — Other Ambulatory Visit: Payer: Self-pay | Admitting: Nurse Practitioner

## 2020-12-11 ENCOUNTER — Inpatient Hospital Stay: Payer: Medicare Other

## 2020-12-11 ENCOUNTER — Encounter: Payer: Self-pay | Admitting: Hematology

## 2020-12-11 ENCOUNTER — Inpatient Hospital Stay (HOSPITAL_BASED_OUTPATIENT_CLINIC_OR_DEPARTMENT_OTHER): Payer: Medicare Other | Admitting: Hematology

## 2020-12-11 VITALS — BP 168/60 | HR 72 | Temp 98.2°F | Resp 18 | Ht 64.0 in | Wt 130.7 lb

## 2020-12-11 DIAGNOSIS — Z5111 Encounter for antineoplastic chemotherapy: Secondary | ICD-10-CM | POA: Diagnosis not present

## 2020-12-11 DIAGNOSIS — C221 Intrahepatic bile duct carcinoma: Secondary | ICD-10-CM

## 2020-12-11 DIAGNOSIS — Z5112 Encounter for antineoplastic immunotherapy: Secondary | ICD-10-CM | POA: Diagnosis not present

## 2020-12-11 DIAGNOSIS — C786 Secondary malignant neoplasm of retroperitoneum and peritoneum: Secondary | ICD-10-CM | POA: Diagnosis not present

## 2020-12-11 DIAGNOSIS — T451X5A Adverse effect of antineoplastic and immunosuppressive drugs, initial encounter: Secondary | ICD-10-CM

## 2020-12-11 DIAGNOSIS — N183 Chronic kidney disease, stage 3 unspecified: Secondary | ICD-10-CM | POA: Diagnosis not present

## 2020-12-11 DIAGNOSIS — Z79899 Other long term (current) drug therapy: Secondary | ICD-10-CM | POA: Diagnosis not present

## 2020-12-11 DIAGNOSIS — G620 Drug-induced polyneuropathy: Secondary | ICD-10-CM

## 2020-12-11 DIAGNOSIS — Z95828 Presence of other vascular implants and grafts: Secondary | ICD-10-CM

## 2020-12-11 LAB — CMP (CANCER CENTER ONLY)
ALT: 10 U/L (ref 0–44)
AST: 23 U/L (ref 15–41)
Albumin: 3.6 g/dL (ref 3.5–5.0)
Alkaline Phosphatase: 74 U/L (ref 38–126)
Anion gap: 10 (ref 5–15)
BUN: 32 mg/dL — ABNORMAL HIGH (ref 8–23)
CO2: 24 mmol/L (ref 22–32)
Calcium: 9.5 mg/dL (ref 8.9–10.3)
Chloride: 109 mmol/L (ref 98–111)
Creatinine: 1.45 mg/dL — ABNORMAL HIGH (ref 0.44–1.00)
GFR, Estimated: 36 mL/min — ABNORMAL LOW (ref >60)
Glucose, Bld: 86 mg/dL (ref 70–99)
Potassium: 4.5 mmol/L (ref 3.5–5.1)
Sodium: 143 mmol/L (ref 135–145)
Total Bilirubin: 0.3 mg/dL (ref 0.3–1.2)
Total Protein: 7.6 g/dL (ref 6.5–8.1)

## 2020-12-11 LAB — CBC WITH DIFFERENTIAL (CANCER CENTER ONLY)
Abs Immature Granulocytes: 0.03 10*3/uL (ref 0.00–0.07)
Basophils Absolute: 0 10*3/uL (ref 0.0–0.1)
Basophils Relative: 1 %
Eosinophils Absolute: 0.2 10*3/uL (ref 0.0–0.5)
Eosinophils Relative: 3 %
HCT: 32 % — ABNORMAL LOW (ref 36.0–46.0)
Hemoglobin: 10.1 g/dL — ABNORMAL LOW (ref 12.0–15.0)
Immature Granulocytes: 1 %
Lymphocytes Relative: 13 %
Lymphs Abs: 0.9 10*3/uL (ref 0.7–4.0)
MCH: 31.5 pg (ref 26.0–34.0)
MCHC: 31.6 g/dL (ref 30.0–36.0)
MCV: 99.7 fL (ref 80.0–100.0)
Monocytes Absolute: 0.9 10*3/uL (ref 0.1–1.0)
Monocytes Relative: 14 %
Neutro Abs: 4.4 10*3/uL (ref 1.7–7.7)
Neutrophils Relative %: 68 %
Platelet Count: 207 10*3/uL (ref 150–400)
RBC: 3.21 MIL/uL — ABNORMAL LOW (ref 3.87–5.11)
RDW: 17.3 % — ABNORMAL HIGH (ref 11.5–15.5)
WBC Count: 6.4 10*3/uL (ref 4.0–10.5)
nRBC: 0 % (ref 0.0–0.2)

## 2020-12-11 MED ORDER — GEMCITABINE HCL CHEMO INJECTION 1 GM/26.3ML: 600.0000 mg/m2 | Freq: Once | INTRAVENOUS | Status: DC

## 2020-12-11 MED ORDER — SODIUM CHLORIDE 0.9 % IV SOLN: Freq: Once | INTRAVENOUS | Status: AC

## 2020-12-11 MED ORDER — SODIUM CHLORIDE 0.9% FLUSH
10.0000 mL | Freq: Once | INTRAVENOUS | Status: AC
  Administered 2020-12-11: 10 mL

## 2020-12-11 MED ORDER — SODIUM CHLORIDE 0.9 % IV SOLN
600.0000 mg/m2 | Freq: Once | INTRAVENOUS | Status: AC
  Administered 2020-12-11: 988 mg via INTRAVENOUS
  Filled 2020-12-11: qty 25.98

## 2020-12-11 MED ORDER — PROCHLORPERAZINE MALEATE 10 MG PO TABS
10.0000 mg | ORAL_TABLET | Freq: Once | ORAL | Status: AC
  Administered 2020-12-11: 10 mg via ORAL
  Filled 2020-12-11: qty 1

## 2020-12-11 NOTE — Patient Instructions (Signed)
[facility]   ?Discharge Instructions: ?Thank you for choosing [facility] to provide your oncology and hematology care.  ? ?If you have a lab appointment with the Cancer Center, please go directly to the Cancer Center and check in at the registration area. ?  ?Wear comfortable clothing and clothing appropriate for easy access to any Portacath or PICC line.  ? ?We strive to give you quality time with your provider. You may need to reschedule your appointment if you arrive late (15 or more minutes).  Arriving late affects you and other patients whose appointments are after yours.  Also, if you miss three or more appointments without notifying the office, you may be dismissed from the clinic at the provider?s discretion.    ?  ?For prescription refill requests, have your pharmacy contact our office and allow 72 hours for refills to be completed.   ? ?Today you received the following chemotherapy and/or immunotherapy agents: gemcitabine    ?  ?To help prevent nausea and vomiting after your treatment, we encourage you to take your nausea medication as directed. ? ?BELOW ARE SYMPTOMS THAT SHOULD BE REPORTED IMMEDIATELY: ?*FEVER GREATER THAN 100.4 F (38 ?C) OR HIGHER ?*CHILLS OR SWEATING ?*NAUSEA AND VOMITING THAT IS NOT CONTROLLED WITH YOUR NAUSEA MEDICATION ?*UNUSUAL SHORTNESS OF BREATH ?*UNUSUAL BRUISING OR BLEEDING ?*URINARY PROBLEMS (pain or burning when urinating, or frequent urination) ?*BOWEL PROBLEMS (unusual diarrhea, constipation, pain near the anus) ?TENDERNESS IN MOUTH AND THROAT WITH OR WITHOUT PRESENCE OF ULCERS (sore throat, sores in mouth, or a toothache) ?UNUSUAL RASH, SWELLING OR PAIN  ?UNUSUAL VAGINAL DISCHARGE OR ITCHING  ? ?Items with * indicate a potential emergency and should be followed up as soon as possible or go to the Emergency Department if any problems should occur. ? ?Please show the CHEMOTHERAPY ALERT CARD or IMMUNOTHERAPY ALERT CARD at check-in  to the Emergency Department and triage nurse. ? ?Should you have questions after your visit or need to cancel or reschedule your appointment, please contact [facility]  Dept: [phone]  and follow the prompts.  Office hours are 8:00 a.m. to 4:30 p.m. Monday - Friday. Please note that voicemails left after 4:00 p.m. may not be returned until the following business day.  We are closed weekends and major holidays. You have access to a nurse at all times for urgent questions. Please call the main number to the clinic Dept: [phone] and follow the prompts. ? ? ?For any non-urgent questions, you may also contact your provider using MyChart. We now offer e-Visits for anyone 18 and older to request care online for non-urgent symptoms. For details visit [email or web address]. ?  ?Also download the MyChart app! Go to the app store, search "MyChart", open the app, select [facility], and log in with your MyChart username and password. ? ?Due to Covid, a mask is required upon entering the hospital/clinic. If you do not have a mask, one will be given to you upon arrival. For doctor visits, patients may have 1 support person aged 18 or older with them. For treatment visits, patients cannot have anyone with them due to current Covid guidelines and our immunocompromised population.  ? ?

## 2020-12-16 DIAGNOSIS — N644 Mastodynia: Secondary | ICD-10-CM | POA: Diagnosis not present

## 2020-12-16 DIAGNOSIS — Z853 Personal history of malignant neoplasm of breast: Secondary | ICD-10-CM | POA: Diagnosis not present

## 2020-12-20 ENCOUNTER — Encounter: Payer: Self-pay | Admitting: Hematology

## 2020-12-20 ENCOUNTER — Encounter: Payer: Self-pay | Admitting: Oncology

## 2020-12-24 NOTE — Progress Notes (Signed)
[place]   [phone] [phone]   Clinic Follow up Note   Patient Care Team: [name], MD as PCP - General (Family Medicine) [name], [name], MD as PCP - Cardiology (Cardiology) [name], [name], MD as Consulting Physician (Cardiology) [name], MD as Consulting Physician (Obstetrics and Gynecology) [name], [name], NP as Nurse Practitioner (Hematology and Oncology) [facility] [name], MD as Consulting Physician (Hematology) [name], [name], MD as Consulting Physician (Gastroenterology) [name], RN (Inactive) as Oncology Nurse Navigator [name], MD as Consulting Physician (General Surgery)  Date of Service:  12/25/2020  CHIEF COMPLAINT: f/u of cholangiocarcinoma  ASSESSMENT & PLAN:  Donna May is a 80 y.o. female with   1. Intrahepatic cholangiocarcinoma, cT1N0M1, with peritoneal metastasis, MSS, IDH1 mutation (+) -Diagnosed in 09/2018 biopsy of her liver mass shows adenocarcinoma, most consistent with cholangiocarcinoma. 09/29/18 PET scan showed no evidence of distant metastasis. -Unfortunately she was found to have peritoneal metastasis to right diaphragm and surgery was aborted. -Her CT AP from 11/29/18 shows dominate liver mass stable to mild increase, mild nodularity of both adrenal glands which warrants being monitored.  No visible peritoneal metastasis on CT scan. -FO results showed MSI stable disease, IDH mutation positive.  Consider IDH 1 inhibitor in the future.   -She began first line oxaliplatin (due to CKD) and gemcitabine every 2 weeks on 11/30/2018   -she responded very well to treatment. Oxali was dose reduced and eventually stopped after cycle 11. She received single agent gemcitabine with cycle 12. Cisplatin added with C13 on 05/17/19 but neuropathy worsened and stopped on 06/14/19. -She has been receiving single agent gemcitabine every 2 weeks starting from 06/28/19  C16, tolerating well. She took chemo break from 10/21 - 1/22 -PET from 03/14/2020 showed concern for disease progression in liver, she restarted maintenance gemcitabine every 2 weeks from 04/03/2020. -09/26/2020 PET showed recurrent hypermetabolic activity in the dominant liver mass, consistent with recurrent disease -I added durvalumab to gemcitabine, she is tolerating well  -She is tolerating chemotherapy well overall, except persistent neuropathy  -Restaging is scheduled for next week -Follow-up in 2 weeks  2.  Grade 3 CIPN -secondary to Oxaliplatin, eventually dc'd after cycle 11.  Her diabetes may contributed to it also. -started cisplatin on 05/17/19, neuropathy worsened in her feet with unsteady gait and functional limitations, decreased vibratory sense on tuning fork exam 05/31/19. Cisplatin was dose reduced then eventually stopped on 06/14/19.  -low efficacy of gabapentin and cymbalta, also tried lyrica briefly.  She completed PT for balance. Ambulates with cane to avoid fall -She has functional difficulties and ambulates with a cane -Follow-up open with Dr. Mickeal Skinner, pt did not find helpful. Also seen by chiropractor -Continues to have decreased function, on gabapentin 100 mg as needed, she does not take much. -She went to chronic conditions center and was offered a type of treatment that would be $6000, she declined -Ineligible for [facility] [place] 2102 neuropathy clinical trial, I suggest her to take PEA off trial   3. H/o right breast cancer, Genetics negative  -s/p mastectomy in 1992, per patient did not require adjuvant therapy -previously followed by Dr. Jana Hakim  -VUS of gene APC; genetics otherwise normal   4. Comorbidities: DM, HTN, hiatal hernia, GERD, renal artery stenosis, CHF -Follow-up with PCP, nephrology, and Dr. Dwyane Dee, and cardiology -stable, well controlled -Her BP was elevated today. She endorses taking her medication as directed. She notes it is related to the stress  of being  here.   5. Family support -she has a son and daughter who are supportive. -patient remains independent    6. Goal of care discussion -We frequently discuss the goal of her treatment is palliative and that her cancer is not curable with chemo alone at this stage. The goal is to control her disease, prolong her life, and give her good quality of life. She understands -she is full code now    7. CKD Stage III -continue monitoring Scr 1.4 - 1.7   PLAN: -proceed with gemcitabine today and in 2 and 4 weeks -Continue daratumumab every 4 weeks, due today  -restaging PET scan next week -f/u in 2 weeks     SUMMARY OF ONCOLOGIC HISTORY: Oncology History Overview Note  Cancer Staging Intrahepatic cholangiocarcinoma ([place]) Staging form: Intrahepatic Bile Duct, AJCC 8th Edition - Clinical stage from 09/23/2018: Stage IB (cT1b, cN0, cM0) - Signed by [name], MD on 10/06/2018    Intrahepatic cholangiocarcinoma ([place])  09/07/2018 Imaging   CT Chest IMPRESSION: 1. New, enhancing mass involving segment 4 of the liver and fundus of gallbladder is concerning for malignancy. This may represent either metastatic disease from breast cancer or neoplasm primary to the liver or hepatic biliary tree. Further evaluation with contrast enhanced CT of the abdomen and pelvis is recommended. 2. No findings to suggest metastatic disease within the chest. 3.  Aortic Atherosclerosis (ICD10-I70.0). 4. Coronary artery calcifications.   09/13/2018 Pathology Results   Diagnosis Liver, needle/core biopsy - ADENOCARCINOMA. Microscopic Comment Immunohistochemistry for CK7 is positive. CK20, TTF1, CDX-2, GATA-3, PAX 8, Qualitative ER, p63 and CK5/6 are negative. The provided clinical history of remote mammary carcinoma is noted. Based on the morphology and immunophenotype of the adenocarcinoma observed in this specimen, primary cholangiocarcinoma is favored. Clinical and radiologic correlation are  encouraged. Results  reported to [employer] on 09/15/2018. Intradepartmental consultation (Dr. Vic Ripper).   09/13/2018 Initial Diagnosis   Cholangiocarcinoma ([place])   09/23/2018 Procedure   Colonoscopy by Dr. Havery Moros 09/23/18  IMPRESSION - Two 3 to 4 mm polyps in the ascending colon, removed with a cold snare. Resected and retrieved. - Five 3 to 5 mm polyps in the transverse colon, removed with a cold snare. Resected and retrieved. - One 5 mm polyp at the splenic flexure, removed with a cold snare. Resected and retrieved. - Three 3 to 5 mm polyps in the sigmoid colon, removed with a cold snare. Resected and retrieved. - The examination was otherwise normal. Upper Endopscy by Dr. Havery Moros 09/23/18  IMPRESSION - Esophagogastric landmarks identified. - 2 cm hiatal hernia. - Normal esophagus otherwise. - A single gastric polyp. Resected and retrieved. - Mild gastritis. Biopsied. - Normal duodenal bulb and second portion of the duodenum.   09/23/2018 Pathology Results   Diagnosis 09/23/18 1. Surgical [P], duodenum - BENIGN SMALL BOWEL MUCOSA. - NO ACTIVE INFLAMMATION OR VILLOUS ATROPHY IDENTIFIED. 2. Surgical [P], stomach, polyp - HYPERPLASTIC POLYP(S). - THERE IS NO EVIDENCE OF MALIGNANCY. 3. Surgical [P], gastric antrum and gastric body - CHRONIC INACTIVE GASTRITIS. - THERE IS NO EVIDENCE OF HELICOBACTER-PYLORI, DYSPLASIA, OR MALIGNANCY. - SEE COMMENT. 4. Surgical [P], colon, sigmoid, splenic flexure, transverse and ascending, polyp (9) - TUBULAR ADENOMA(S). - SESSILE SERRATED POLYP WITHOUT CYTOLOGIC DYSPLASIA. - HIGH GRADE DYSPLASIA IS NOT IDENTIFIED. 5. Surgical [P], colon, sigmoid, polyp (2) - HYPERPLASTIC POLYP(S). - THERE IS NO EVIDENCE OF MALIGNANCY.   09/23/2018 Cancer Staging   Staging form: Intrahepatic Bile Duct, AJCC 8th Edition - Clinical stage  from 09/23/2018: Stage IB (cT1b, cN0, cM0) - Signed by [name], MD on 10/06/2018   09/29/2018 PET scan   PET 09/29/18 IMPRESSION: 1.  Hypermetabolic mass in the RIGHT hepatic lobe consistent with biopsy proven adenocarcinoma. No additional liver metastasis. 2. No evidence of local breast cancer recurrence in the RIGHT breast or RIGHT axilla. 3. Mild bilateral hypermetabolic adrenal glands is favored benign hyperplasia. 4. No evidence of additional metastatic disease on skull base to thigh FDG PET scan.   11/06/2018 Genetic Testing   Negative genetic testing on the [facility] Common Hereditary Cancers panel. A variant of uncertain significance was identified in one of her APC genes, called c.1243G>A (p.Ala415Thr).  The Common Hereditary Cancers Panel offered by [facility] includes sequencing and/or deletion duplication testing of the following 48 genes: APC, ATM, AXIN2, BARD1, BMPR1A, BRCA1, BRCA2, BRIP1, CDH1, CDK4, CDKN2A (p14ARF), CDKN2A (p16INK4a), CHEK2, CTNNA1, DICER1, EPCAM (Deletion/duplication testing only), GREM1 (promoter region deletion/duplication testing only), KIT, MEN1, MLH1, MSH2, MSH3, MSH6, MUTYH, NBN, NF1, NHTL1, PALB2, PDGFRA, PMS2, POLD1, POLE, PTEN, RAD50, RAD51C, RAD51D, RNF43, SDHB, SDHC, SDHD, SMAD4, SMARCA4. STK11, TP53, TSC1, TSC2, and VHL.  The following genes were evaluated for sequence changes only: SDHA and HOXB13 c.251G>A variant only.    11/17/2018 Pathology Results   Diagnosis 11/17/18 1. Soft tissue, biopsy, Diaphragmatic nodules - METASTATIC ADENOCARCINOMA, CONSISTENT WITH PATIENT'S CLINICAL HISTORY OF CHOLANGIOCARCINOMA. SEE NOTE 2. Liver, biopsy, Left - LIVER PARENCHYMA WITH A BENIGN FIBROTIC NODULE - NO EVIDENCE OF MALIGNANCY 3. Stomach, biopsy - BENIGN PAPILLARY MESOTHELIAL HYPERPLASIA - NO EVIDENCE OF MALIGNANCY   11/29/2018 Imaging   CT CAP WO Contrast  IMPRESSION: 1. Dominant liver mass appears grossly stable from 09/29/2018. Additional liver lesions are too small to characterize but were not shown to be hypermetabolic on PET. 2. Mild nodularity of both adrenal glands with  associated hypermetabolism on 09/29/2018. Continued attention on follow-up exams is warranted. 3. Small right lower lobe nodules, stable from 09/07/2018. Again, attention on follow-up is recommended. 4. Trace bilateral pleural fluid. 5. Aortic atherosclerosis (ICD10-170.0). Coronary artery calcification. 6. Enlarged pulmonic trunk, indicative of pulmonary arterial hypertension.     11/30/2018 - 06/14/2019 Chemotherapy   First line chemo Oxaliplatin and gemcitabine q2weeks starting 11/30/18. Stopped Oxaliplatin on 05/03/19 due to worsening neuropathy. Added Cisplatin with C13 on 05/17/19 and stopped on C15 06/14/19 due to neuropathy. Reduced to maintenance single agent Gemcitabine on 06/28/19.    01/20/2019 Imaging   CT CAP IMPRESSION: restaging  1. Mild interval increase in size of dominant liver mass involving segment 4 and segment 5. 2. No significant or progressive adrenal nodularity identified to suggest metastatic disease. 3. Unchanged appearance of small right lower lobe and lingular lung nodules, nonspecific.   03/21/2019 PET scan   IMPRESSION: 1. Large right hepatic mass with SUV uptake near background hepatic activity, dramatic response to therapy, also with decrease in size when compared to the prior study. 2. Signs of prior right mastectomy and axillary dissection as before. 3. Left adrenal activity in no longer above the level of activity seen in the contralateral, right adrenal gland or in the liver. Attention on follow-up. 4. No new signs of disease.   06/27/2019 PET scan   IMPRESSION: 1. Further decrease in size of a dominant hepatic mass which is non FDG avid. 2. No evidence of metastatic disease. 3. No evidence of left adrenal hypermetabolism or mass. 4. Incidental findings, including: Uterine fibroids. Coronary artery atherosclerosis. Aortic Atherosclerosis (ICD10-I70.0). Pulmonary artery enlargement suggests pulmonary arterial hypertension.  06/28/2019 -   Chemotherapy   Maintenance single agent Gemcitabine q2weeks starting on 06/28/19 with C16.  ------On chemo break since 12/27/19.  ------She restarted Single Agent Gemcitabine q2weeks on 04/03/20 due to mild disease progression in liver on 02/2020 PET    09/15/2019 PET scan   IMPRESSION: 1. In the region of regional tumor at the junction of the right and left hepatic lobe, there is continued hypodensity and overall activity slightly less than that of the surrounding normal liver, indicating effective response to therapy. No current worrisome hypermetabolic lesion is identified in the liver or elsewhere. 2. Chronic asymmetric edema in the subcutaneous tissues of the right upper extremity, nonspecific. The patient has had a prior right mastectomy and right axillary dissection. 3. Other imaging findings of potential clinical significance: Aortic Atherosclerosis (ICD10-I70.0). Coronary atherosclerosis. Mild cardiomegaly. Small right and trace left pleural effusions. Subcutaneous and mild mesenteric edema. Suspected uterine fibroids.   12/18/2019 PET scan   IMPRESSION: 1. No significant abnormal activity to suggest active/recurrent malignancy. 2. Lingual tonsillar activity is mildly increased from prior but probably incidental/physiologic, attention on follow up suggested. 3. Other imaging findings of potential clinical significance: Third spacing of fluid with trace ascites, mesenteric edema, subcutaneous edema, and possibly subtle pulmonary edema. Mild cardiomegaly. Aortic Atherosclerosis (ICD10-I70.0). Coronary atherosclerosis. Right ovarian dermoid. Calcified uterine fibroids. Renal cysts. Right glenohumeral arthropathy. Mild to moderate scoliosis. Suspected pulmonary arterial hypertension.   03/14/2020 PET scan   IMPRESSION: 1. New small focus of hypermetabolism in segment 4A of the liver worrisome for new metastatic focus. 2. No other findings for metastatic disease involving the  neck, chest, abdomen or pelvis.     06/11/2020 PET scan   IMPRESSION: 1. No suspicious hypermetabolic activity within the neck, chest, abdomen or pelvis. 2. The treated dominant peripheral cholangiocarcinoma appears slightly enlarged compared with recent prior studies, and there is a questionable new lesion more superiorly in the left lobe. Although without associated hypermetabolic activity, these lesions are suspicious and would be better assessed with abdominal MRI without and with contrast.   09/17/2020 PET scan   IMPRESSION: 1. Recurrent hypermetabolic activity around the periphery of the dominant hepatic mass consistent with local recurrence. This mass appears slightly larger. Possible small metastasis in the dome of the left hepatic lobe is unchanged in size, without hypermetabolic activity. 2. No distant metastases identified. 3. Stable incidental findings as detailed above.   10/16/2020 -  Chemotherapy   Patient is on Treatment Plan : BLADDER Durvalumab q28d        CURRENT THERAPY:  1. Maintenance single agent Gemcitabine q2weeks starting on 06/28/19 with C16. On chemo break since 12/27/19. She restarted Single Agent Gemcitabine q2weeks on 04/03/20 due to mild disease progression. 2. Adding imfinzi due to mild progression on 09/26/20 PET scan, every 4 weeks   INTERVAL HISTORY:  Donna May is here for a follow up of cholangio. She was last seen by me 2 weeks ago.  She presents to clinic for herself.  She is clinically stable, no noticeable side effects after chemo.  Her main concern is still persistent severe neuropathy appears to be stable.  She takes Neurontin occasionally because she does not want to has too much medicine.   All other systems were reviewed with the patient and are negative.  MEDICAL HISTORY:  Past Medical History:  Diagnosis Date   Anemia    Anxiety    Arthritis    Breast cancer ([place]) 1992   right   Cataract  Bilateral eyes - surgery to remove    Chronic diastolic CHF (congestive heart failure) (HCC)    Chronic kidney disease    CKD stage 3   Complication of anesthesia    "something they use make me itch for a couple of days."   Depression    Diabetes mellitus    type 2   Duodenitis 01/18/2002   Fainting spell    GERD (gastroesophageal reflux disease)    Heart murmur    never has caused any problems   Hiatal hernia 08/08/2008, 01/18/2002   History of pneumonia    x 2   Hyperlipidemia    Hypertension    Liver cancer ([place]) 08/2018   chemotherapy   Lymphedema 2017   Right arm   Peripheral neuropathy     SURGICAL HISTORY: Past Surgical History:  Procedure Laterality Date   AXILLARY SURGERY     cyst removal, right   COLONOSCOPY  09/23/2018   Dr. Havery Moros - polyps   EYE SURGERY Bilateral    cataracts to remove   LAPAROSCOPY N/A 11/17/2018   Procedure: LAPAROSCOPY DIAGNOSTIC, INTRAOPERATIVE ULTRASOUND, PERITONEAL BIOPSIES;  Surgeon: [name], MD;  Location: [facility];  Service: General;  Laterality: N/A;  GENERAL AND EPIDURAL   LIVER BIOPSY  08/2018   Dr. Lindwood Coke   MASTECTOMY  1992   right, with flap   NM [place]  06/11/2009   Protocol:[place], post stress EF58%, EKG negative for ischemia, low risk   PORTACATH PLACEMENT N/A 12/02/2018   Procedure: INSERTION PORT-A-CATH;  Surgeon: [name], MD;  Location: [facility];  Service: General;  Laterality: N/A;   RECONSTRUCTION BREAST W/ TRAM FLAP Right    TONSILLECTOMY  1958   TRANSTHORACIC ECHOCARDIOGRAM  12/24/2009   LVEF =>55%, normal study   UPPER GI ENDOSCOPY      I have reviewed the social history and family history with the patient and they are unchanged from previous note.  ALLERGIES:  has No Known Allergies.  MEDICATIONS:  Current Outpatient Medications  Medication Sig Dispense Refill   amLODipine (NORVASC) 10 MG tablet Take 1 tablet (10 mg total) by mouth daily. 90 tablet 3   aspirin 81 MG tablet Take 81 mg by mouth daily.      Baclofen 5 MG  TABS TAKE 1/2 TABLET BY MOUTH ONCE DAILY AS NEEDED 42 tablet 1   carvedilol (COREG) 25 MG tablet Take 1 tablet (25 mg total) by mouth 2 (two) times daily with a meal. 180 tablet 3   furosemide (LASIX) 40 MG tablet Take 1 tablet (40 mg total) by mouth daily. 30 tablet 11   gabapentin (NEURONTIN) 100 MG capsule Take 1 capsule (100 mg total) by mouth every 8 (eight) hours as needed. 180 capsule 2   glucose blood test strip 1 each by Other route 2 (two) times daily. Use Onetouch verio test strips as instructed to check blood sugar twice daily. 100 each 2   hydrALAZINE (APRESOLINE) 100 MG tablet TAKE THREE TIMES DAILY-AM, MIDDLE OF THE DAY AND IN THE EVENING. 270 tablet 3   KLOR-CON M10 10 MEQ tablet TAKE 1 TABLET BY MOUTH EVERY DAY 90 tablet 1   lansoprazole (PREVACID) 15 MG capsule Take 1 capsule (15 mg total) by mouth daily at 12 noon. Take 1 capsule by mouth once or twice a day as needed 90 capsule 3   lidocaine-prilocaine (EMLA) cream Apply 1 application topically as needed. 30 g 1   loratadine (CLARITIN) 10 MG tablet Take 10  mg by mouth daily as needed for allergies.     Omega 3 1000 MG CAPS Take 2,000 mg by mouth daily.      ondansetron (ZOFRAN-ODT) 4 MG disintegrating tablet Take 1 tablet (4 mg total) by mouth every 8 (eight) hours as needed for nausea or vomiting. 30 tablet 1   polyethylene glycol (MIRALAX / GLYCOLAX) 17 g packet Take 17 g by mouth daily as needed. 14 each 0   sitaGLIPtin (JANUVIA) 100 MG tablet Take 1 tablet (100 mg total) by mouth daily. 90 tablet 3   valsartan (DIOVAN) 320 MG tablet Take 1 tablet (320 mg total) by mouth daily. 90 tablet 3   Current Facility-Administered Medications  Medication Dose Route Frequency Provider Last Rate Last Admin   triamcinolone acetonide (KENALOG) 10 MG/ML injection 10 mg  10 mg Other Once Harriet Masson, DPM       Facility-Administered Medications Ordered in Other Visits  Medication Dose Route Frequency Provider Last Rate Last Admin    durvalumab (IMFINZI) 1,500 mg in sodium chloride 0.9 % 100 mL chemo infusion  1,500 mg Intravenous Once [name], MD       gemcitabine (GEMZAR) 988 mg in sodium chloride 0.9 % 250 mL chemo infusion  600 mg/m2 (Treatment Plan Recorded) Intravenous Once [name], MD       heparin lock flush 100 unit/mL  500 Units Intracatheter Once PRN [name], MD       sodium chloride flush (NS) 0.9 % injection 10 mL  10 mL Intracatheter PRN [name], MD        PHYSICAL EXAMINATION: ECOG PERFORMANCE STATUS: 2 - Symptomatic, <50% confined to bed  Vitals:   12/25/20 0917  BP: (!) 161/65  Pulse: 63  Resp: 18  Temp: 98.3 F (36.8 C)  SpO2: 100%    Wt Readings from Last 3 Encounters:  12/25/20 128 lb (58.1 kg)  12/11/20 130 lb 11.2 oz (59.3 kg)  11/27/20 129 lb 11.2 oz (58.8 kg)    GENERAL:alert, no distress and comfortable SKIN: skin color, texture, turgor are normal, no rashes or significant lesions EYES: normal, Conjunctiva are pink and non-injected, sclera clear  NECK: supple, thyroid normal size, non-tender, without nodularity LYMPH:  no palpable lymphadenopathy in the cervical, axillary  LUNGS: clear to auscultation and percussion with normal breathing effort HEART: regular rate & rhythm and no murmurs and no lower extremity edema ABDOMEN:abdomen soft, non-tender and normal bowel sounds Musculoskeletal:no cyanosis of digits and no clubbing  NEURO: alert & oriented x 3 with fluent speech, no focal motor/sensory deficits  LABORATORY DATA:  I have reviewed the data as listed CBC Latest Ref Rng & Units 12/25/2020 12/11/2020 11/27/2020  WBC 4.0 - 10.5 K/uL 5.1 6.4 6.1  Hemoglobin 12.0 - 15.0 g/dL 9.4(L) 10.1(L) 9.4(L)  Hematocrit 36.0 - 46.0 % 29.7(L) 32.0(L) 29.9(L)  Platelets 150 - 400 K/uL 182 207 195     CMP Latest Ref Rng & Units 12/25/2020 12/11/2020 11/27/2020  Glucose 70 - 99 mg/dL 107(H) 86 110(H)  BUN 8 - 23 mg/dL 27(H) 32(H) 28(H)  Creatinine 0.44 - 1.00 mg/dL 1.30(H) 1.45(H)  1.46(H)  Sodium 135 - 145 mmol/L 144 143 141  Potassium 3.5 - 5.1 mmol/L 4.0 4.5 4.2  Chloride 98 - 111 mmol/L 111 109 108  CO2 22 - 32 mmol/L _0 Calcium 8.9 - 10.3 mg/dL 9.4 9.5 9.3  Total Protein 6.5 - 8.1 g/dL 6.9 7.6 7.3  Total Bilirubin 0.3 - 1.2 mg/dL 0.3  0.3 0.3  Alkaline Phos 38 - 126 U/L 52 74 67  AST 15 - 41 U/L _0 ALT 0 - 44 U/L _1 RADIOGRAPHIC STUDIES: I have personally reviewed the radiological images as listed and agreed with the findings in the report. No results found.    No problem-specific Assessment & Plan notes found for this encounter.   No orders of the defined types were placed in this encounter.  All questions were answered. The patient knows to call the clinic with any problems, questions or concerns. No barriers to learning was detected. The total time spent in the appointment was 30 minutes.     [name], MD 12/25/2020   I, Wilburn Mylar, am acting as scribe for [name], MD.   I have reviewed the above documentation for accuracy and completeness, and I agree with the above.

## 2020-12-25 ENCOUNTER — Inpatient Hospital Stay (HOSPITAL_BASED_OUTPATIENT_CLINIC_OR_DEPARTMENT_OTHER): Payer: Medicare Other | Admitting: Hematology

## 2020-12-25 ENCOUNTER — Encounter: Payer: Self-pay | Admitting: Hematology

## 2020-12-25 ENCOUNTER — Inpatient Hospital Stay: Payer: Medicare Other | Attending: Adult Health

## 2020-12-25 ENCOUNTER — Other Ambulatory Visit: Payer: Self-pay

## 2020-12-25 ENCOUNTER — Inpatient Hospital Stay: Payer: Medicare Other

## 2020-12-25 VITALS — BP 161/65 | HR 63 | Temp 98.3°F | Resp 18 | Ht 64.0 in | Wt 128.0 lb

## 2020-12-25 DIAGNOSIS — C221 Intrahepatic bile duct carcinoma: Secondary | ICD-10-CM | POA: Diagnosis not present

## 2020-12-25 DIAGNOSIS — Z5111 Encounter for antineoplastic chemotherapy: Secondary | ICD-10-CM | POA: Insufficient documentation

## 2020-12-25 DIAGNOSIS — C786 Secondary malignant neoplasm of retroperitoneum and peritoneum: Secondary | ICD-10-CM | POA: Insufficient documentation

## 2020-12-25 DIAGNOSIS — Z23 Encounter for immunization: Secondary | ICD-10-CM | POA: Diagnosis not present

## 2020-12-25 DIAGNOSIS — Z79899 Other long term (current) drug therapy: Secondary | ICD-10-CM | POA: Diagnosis not present

## 2020-12-25 DIAGNOSIS — Z95828 Presence of other vascular implants and grafts: Secondary | ICD-10-CM

## 2020-12-25 DIAGNOSIS — N183 Chronic kidney disease, stage 3 unspecified: Secondary | ICD-10-CM | POA: Diagnosis not present

## 2020-12-25 DIAGNOSIS — Z5112 Encounter for antineoplastic immunotherapy: Secondary | ICD-10-CM | POA: Insufficient documentation

## 2020-12-25 LAB — CBC WITH DIFFERENTIAL (CANCER CENTER ONLY)
Abs Immature Granulocytes: 0.01 10*3/uL (ref 0.00–0.07)
Basophils Absolute: 0 10*3/uL (ref 0.0–0.1)
Basophils Relative: 1 %
Eosinophils Absolute: 0.2 10*3/uL (ref 0.0–0.5)
Eosinophils Relative: 3 %
HCT: 29.7 % — ABNORMAL LOW (ref 36.0–46.0)
Hemoglobin: 9.4 g/dL — ABNORMAL LOW (ref 12.0–15.0)
Immature Granulocytes: 0 %
Lymphocytes Relative: 21 %
Lymphs Abs: 1.1 10*3/uL (ref 0.7–4.0)
MCH: 31.1 pg (ref 26.0–34.0)
MCHC: 31.6 g/dL (ref 30.0–36.0)
MCV: 98.3 fL (ref 80.0–100.0)
Monocytes Absolute: 1 10*3/uL (ref 0.1–1.0)
Monocytes Relative: 19 %
Neutro Abs: 2.9 10*3/uL (ref 1.7–7.7)
Neutrophils Relative %: 56 %
Platelet Count: 182 10*3/uL (ref 150–400)
RBC: 3.02 MIL/uL — ABNORMAL LOW (ref 3.87–5.11)
RDW: 17.3 % — ABNORMAL HIGH (ref 11.5–15.5)
WBC Count: 5.1 10*3/uL (ref 4.0–10.5)
nRBC: 0 % (ref 0.0–0.2)

## 2020-12-25 LAB — CMP (CANCER CENTER ONLY)
ALT: 10 U/L (ref 0–44)
AST: 21 U/L (ref 15–41)
Albumin: 3.5 g/dL (ref 3.5–5.0)
Alkaline Phosphatase: 52 U/L (ref 38–126)
Anion gap: 6 (ref 5–15)
BUN: 27 mg/dL — ABNORMAL HIGH (ref 8–23)
CO2: 27 mmol/L (ref 22–32)
Calcium: 9.4 mg/dL (ref 8.9–10.3)
Chloride: 111 mmol/L (ref 98–111)
Creatinine: 1.3 mg/dL — ABNORMAL HIGH (ref 0.44–1.00)
GFR, Estimated: 41 mL/min — ABNORMAL LOW (ref >60)
Glucose, Bld: 107 mg/dL — ABNORMAL HIGH (ref 70–99)
Potassium: 4 mmol/L (ref 3.5–5.1)
Sodium: 144 mmol/L (ref 135–145)
Total Bilirubin: 0.3 mg/dL (ref 0.3–1.2)
Total Protein: 6.9 g/dL (ref 6.5–8.1)

## 2020-12-25 MED ORDER — HEPARIN SOD (PORK) LOCK FLUSH 100 UNIT/ML IV SOLN
500.0000 [IU] | Freq: Once | INTRAVENOUS | Status: AC | PRN
  Administered 2020-12-25: 500 [IU]

## 2020-12-25 MED ORDER — SODIUM CHLORIDE 0.9% FLUSH
10.0000 mL | Freq: Once | INTRAVENOUS | Status: AC
  Administered 2020-12-25: 10 mL

## 2020-12-25 MED ORDER — SODIUM CHLORIDE 0.9 % IV SOLN
1500.0000 mg | Freq: Once | INTRAVENOUS | Status: AC
  Administered 2020-12-25: 1500 mg via INTRAVENOUS
  Filled 2020-12-25: qty 30

## 2020-12-25 MED ORDER — SODIUM CHLORIDE 0.9% FLUSH
10.0000 mL | INTRAVENOUS | Status: DC | PRN
  Administered 2020-12-25: 10 mL

## 2020-12-25 MED ORDER — PROCHLORPERAZINE MALEATE 10 MG PO TABS
10.0000 mg | ORAL_TABLET | Freq: Once | ORAL | Status: AC
  Administered 2020-12-25: 10 mg via ORAL
  Filled 2020-12-25: qty 1

## 2020-12-25 MED ORDER — SODIUM CHLORIDE 0.9 % IV SOLN: Freq: Once | INTRAVENOUS | Status: AC

## 2020-12-25 MED ORDER — SODIUM CHLORIDE 0.9 % IV SOLN
600.0000 mg/m2 | Freq: Once | INTRAVENOUS | Status: AC
  Administered 2020-12-25: 988 mg via INTRAVENOUS
  Filled 2020-12-25: qty 25.98

## 2020-12-25 MED ORDER — SODIUM CHLORIDE 0.9 % IV SOLN
600.0000 mg/m2 | Freq: Once | INTRAVENOUS | Status: DC
  Filled 2020-12-25: qty 26

## 2020-12-25 NOTE — Patient Instructions (Signed)
[facility]  Discharge Instructions: Thank you for choosing [facility] to provide your oncology and hematology care.   If you have a lab appointment with the [facility], please go directly to the [facility] and check in at the registration area.   Wear comfortable clothing and clothing appropriate for easy access to any Portacath or PICC line.   We strive to give you quality time with your provider. You may need to reschedule your appointment if you arrive late (15 or more minutes).  Arriving late affects you and other patients whose appointments are after yours.  Also, if you miss three or more appointments without notifying the office, you may be dismissed from the clinic at the provider's discretion.      For prescription refill requests, have your pharmacy contact our office and allow 72 hours for refills to be completed.    Today you received the following chemotherapy and/or immunotherapy agents: Imfinzi/Gemzar.      To help prevent nausea and vomiting after your treatment, we encourage you to take your nausea medication as directed.  BELOW ARE SYMPTOMS THAT SHOULD BE REPORTED IMMEDIATELY: *FEVER GREATER THAN 100.4 F (38 C) OR HIGHER *CHILLS OR SWEATING *NAUSEA AND VOMITING THAT IS NOT CONTROLLED WITH YOUR NAUSEA MEDICATION *UNUSUAL SHORTNESS OF BREATH *UNUSUAL BRUISING OR BLEEDING *URINARY PROBLEMS (pain or burning when urinating, or frequent urination) *BOWEL PROBLEMS (unusual diarrhea, constipation, pain near the anus) TENDERNESS IN MOUTH AND THROAT WITH OR WITHOUT PRESENCE OF ULCERS (sore throat, sores in mouth, or a toothache) UNUSUAL RASH, SWELLING OR PAIN  UNUSUAL VAGINAL DISCHARGE OR ITCHING   Items with * indicate a potential emergency and should be followed up as soon as possible or go to the Emergency Department if any problems should occur.  Please show the CHEMOTHERAPY ALERT CARD or IMMUNOTHERAPY ALERT CARD at  check-in to the Emergency Department and triage nurse.  Should you have questions after your visit or need to cancel or reschedule your appointment, please contact [facility]  Dept: [phone]  and follow the prompts.  Office hours are 8:00 a.m. to 4:30 p.m. Monday - Friday. Please note that voicemails left after 4:00 p.m. may not be returned until the following business day.  We are closed weekends and major holidays. You have access to a nurse at all times for urgent questions. Please call the main number to the clinic Dept: [phone] and follow the prompts.   For any non-urgent questions, you may also contact your provider using MyChart. We now offer e-Visits for anyone 82 and older to request care online for non-urgent symptoms. For details visit [email or web address].   Also download the MyChart app! Go to the app store, search "MyChart", open the app, select [facility], and log in with your MyChart username and password.  Due to Covid, a mask is required upon entering the hospital/clinic. If you do not have a mask, one will be given to you upon arrival. For doctor visits, patients may have 1 support person aged 82 or older with them. For treatment visits, patients cannot have anyone with them due to current Covid guidelines and our immunocompromised population.

## 2020-12-26 ENCOUNTER — Telehealth: Payer: Self-pay | Admitting: Hematology

## 2020-12-26 NOTE — Telephone Encounter (Signed)
Scheduled follow-up appointments per 10/12 los. Patient is aware. 

## 2020-12-30 ENCOUNTER — Telehealth: Payer: Self-pay

## 2020-12-30 NOTE — Telephone Encounter (Signed)
1129 am.  Phone call made to patient to schedule a home visit or complete a telephonic visit.  No answer.  Message has been left requesting a call back.

## 2021-01-02 ENCOUNTER — Other Ambulatory Visit: Payer: Self-pay

## 2021-01-02 ENCOUNTER — Ambulatory Visit (HOSPITAL_COMMUNITY)
Admission: RE | Admit: 2021-01-02 | Discharge: 2021-01-02 | Disposition: A | Discharge status: Home / Self Care | Payer: Medicare Other | Source: Ambulatory Visit | Attending: Hematology | Admitting: Hematology

## 2021-01-02 DIAGNOSIS — N281 Cyst of kidney, acquired: Secondary | ICD-10-CM | POA: Insufficient documentation

## 2021-01-02 DIAGNOSIS — Z853 Personal history of malignant neoplasm of breast: Secondary | ICD-10-CM | POA: Diagnosis not present

## 2021-01-02 DIAGNOSIS — C221 Intrahepatic bile duct carcinoma: Secondary | ICD-10-CM

## 2021-01-02 DIAGNOSIS — D259 Leiomyoma of uterus, unspecified: Secondary | ICD-10-CM | POA: Insufficient documentation

## 2021-01-02 DIAGNOSIS — K7689 Other specified diseases of liver: Secondary | ICD-10-CM | POA: Diagnosis not present

## 2021-01-02 DIAGNOSIS — I700 Atherosclerosis of aorta: Secondary | ICD-10-CM | POA: Insufficient documentation

## 2021-01-02 LAB — GLUCOSE, CAPILLARY: Glucose-Capillary: 97 mg/dL (ref 70–99)

## 2021-01-02 MED ORDER — FLUDEOXYGLUCOSE F - 18 (FDG) INJECTION
6.4000 | Freq: Once | INTRAVENOUS | Status: AC
  Administered 2021-01-02: 6.38 via INTRAVENOUS

## 2021-01-08 ENCOUNTER — Other Ambulatory Visit: Payer: Self-pay

## 2021-01-08 ENCOUNTER — Inpatient Hospital Stay (HOSPITAL_BASED_OUTPATIENT_CLINIC_OR_DEPARTMENT_OTHER): Payer: Medicare Other | Admitting: Hematology

## 2021-01-08 ENCOUNTER — Inpatient Hospital Stay: Payer: Medicare Other

## 2021-01-08 ENCOUNTER — Telehealth: Payer: Self-pay | Admitting: Hematology

## 2021-01-08 VITALS — BP 158/56 | HR 75 | Temp 98.1°F | Resp 18 | Ht 64.0 in | Wt 132.6 lb

## 2021-01-08 DIAGNOSIS — Z5111 Encounter for antineoplastic chemotherapy: Secondary | ICD-10-CM | POA: Diagnosis not present

## 2021-01-08 DIAGNOSIS — C221 Intrahepatic bile duct carcinoma: Secondary | ICD-10-CM

## 2021-01-08 DIAGNOSIS — I248 Other forms of acute ischemic heart disease: Secondary | ICD-10-CM

## 2021-01-08 DIAGNOSIS — Z23 Encounter for immunization: Secondary | ICD-10-CM

## 2021-01-08 DIAGNOSIS — Z5112 Encounter for antineoplastic immunotherapy: Secondary | ICD-10-CM | POA: Diagnosis not present

## 2021-01-08 DIAGNOSIS — Z95828 Presence of other vascular implants and grafts: Secondary | ICD-10-CM

## 2021-01-08 DIAGNOSIS — N183 Chronic kidney disease, stage 3 unspecified: Secondary | ICD-10-CM | POA: Diagnosis not present

## 2021-01-08 DIAGNOSIS — C786 Secondary malignant neoplasm of retroperitoneum and peritoneum: Secondary | ICD-10-CM | POA: Diagnosis not present

## 2021-01-08 LAB — CBC WITH DIFFERENTIAL (CANCER CENTER ONLY)
Abs Immature Granulocytes: 0.02 10*3/uL (ref 0.00–0.07)
Basophils Absolute: 0 10*3/uL (ref 0.0–0.1)
Basophils Relative: 1 %
Eosinophils Absolute: 0.2 10*3/uL (ref 0.0–0.5)
Eosinophils Relative: 3 %
HCT: 29.4 % — ABNORMAL LOW (ref 36.0–46.0)
Hemoglobin: 9.5 g/dL — ABNORMAL LOW (ref 12.0–15.0)
Immature Granulocytes: 0 %
Lymphocytes Relative: 17 %
Lymphs Abs: 0.9 10*3/uL (ref 0.7–4.0)
MCH: 32 pg (ref 26.0–34.0)
MCHC: 32.3 g/dL (ref 30.0–36.0)
MCV: 99 fL (ref 80.0–100.0)
Monocytes Absolute: 1.1 10*3/uL — ABNORMAL HIGH (ref 0.1–1.0)
Monocytes Relative: 20 %
Neutro Abs: 3.4 10*3/uL (ref 1.7–7.7)
Neutrophils Relative %: 59 %
Platelet Count: 196 10*3/uL (ref 150–400)
RBC: 2.97 MIL/uL — ABNORMAL LOW (ref 3.87–5.11)
RDW: 17.4 % — ABNORMAL HIGH (ref 11.5–15.5)
WBC Count: 5.6 10*3/uL (ref 4.0–10.5)
nRBC: 0 % (ref 0.0–0.2)

## 2021-01-08 LAB — CMP (CANCER CENTER ONLY)
ALT: 7 U/L (ref 0–44)
AST: 16 U/L (ref 15–41)
Albumin: 3.5 g/dL (ref 3.5–5.0)
Alkaline Phosphatase: 67 U/L (ref 38–126)
Anion gap: 10 (ref 5–15)
BUN: 38 mg/dL — ABNORMAL HIGH (ref 8–23)
CO2: 23 mmol/L (ref 22–32)
Calcium: 9.1 mg/dL (ref 8.9–10.3)
Chloride: 110 mmol/L (ref 98–111)
Creatinine: 1.55 mg/dL — ABNORMAL HIGH (ref 0.44–1.00)
GFR, Estimated: 33 mL/min — ABNORMAL LOW (ref >60)
Glucose, Bld: 102 mg/dL — ABNORMAL HIGH (ref 70–99)
Potassium: 4.5 mmol/L (ref 3.5–5.1)
Sodium: 143 mmol/L (ref 135–145)
Total Bilirubin: 0.3 mg/dL (ref 0.3–1.2)
Total Protein: 7 g/dL (ref 6.5–8.1)

## 2021-01-08 MED ORDER — HEPARIN SOD (PORK) LOCK FLUSH 100 UNIT/ML IV SOLN
500.0000 [IU] | Freq: Once | INTRAVENOUS | Status: AC | PRN
  Administered 2021-01-08: 500 [IU]

## 2021-01-08 MED ORDER — INFLUENZA VAC A&B SA ADJ QUAD 0.5 ML IM PRSY
0.5000 mL | PREFILLED_SYRINGE | Freq: Once | INTRAMUSCULAR | Status: AC
  Administered 2021-01-08: 0.5 mL via INTRAMUSCULAR
  Filled 2021-01-08: qty 0.5

## 2021-01-08 MED ORDER — SODIUM CHLORIDE 0.9 % IV SOLN: Freq: Once | INTRAVENOUS | Status: DC

## 2021-01-08 MED ORDER — SODIUM CHLORIDE 0.9% FLUSH
10.0000 mL | Freq: Once | INTRAVENOUS | Status: AC
  Administered 2021-01-08: 10 mL

## 2021-01-08 MED ORDER — SODIUM CHLORIDE 0.9 % IV SOLN
600.0000 mg/m2 | Freq: Once | INTRAVENOUS | Status: AC
  Administered 2021-01-08: 988 mg via INTRAVENOUS
  Filled 2021-01-08: qty 25.98

## 2021-01-08 MED ORDER — SODIUM CHLORIDE 0.9% FLUSH
10.0000 mL | INTRAVENOUS | Status: DC | PRN
  Administered 2021-01-08: 10 mL

## 2021-01-08 MED ORDER — PROCHLORPERAZINE MALEATE 10 MG PO TABS
10.0000 mg | ORAL_TABLET | Freq: Once | ORAL | Status: AC
  Administered 2021-01-08: 10 mg via ORAL
  Filled 2021-01-08: qty 1

## 2021-01-08 NOTE — Telephone Encounter (Signed)
Left message with cancelled upcoming appointments per 10/26 los.

## 2021-01-08 NOTE — Progress Notes (Signed)
[facility]   [phone] [phone]   Clinic Follow up Note   Patient Care Team: [name], MD as PCP - General (Family Medicine) [name], [name], MD as PCP - Cardiology (Cardiology) [name], [name], MD as Consulting Physician (Cardiology) [name], MD as Consulting Physician (Obstetrics and Gynecology) [name], [name], NP as Nurse Practitioner (Hematology and Oncology) [facility] [name], MD as Consulting Physician (Hematology) [name], [name], MD as Consulting Physician (Gastroenterology) Virgina Evener, Dawn, RN (Inactive) as Oncology Nurse Navigator [name], MD as Consulting Physician (General Surgery)  Date of Service:  01/08/2021  CHIEF COMPLAINT: f/u of cholangiocarcinoma  CURRENT THERAPY:  1. Maintenance single agent Gemcitabine q2weeks starting on 06/28/19 with C16. On chemo break since 12/27/19. She restarted Single Agent Gemcitabine q2weeks on 04/03/20 due to mild disease progression. 2. Adding imfinzi due to mild progression on 09/26/20 PET scan, every 4 weeks   ASSESSMENT & PLAN:  Donna May is a 80 y.o. female with   1. Intrahepatic cholangiocarcinoma, cT1N0M1, with peritoneal metastasis, MSS, IDH1 mutation (+) -Diagnosed in 09/2018 biopsy of her liver mass shows adenocarcinoma, most consistent with cholangiocarcinoma. 09/29/18 PET scan showed no evidence of distant metastasis. -Unfortunately she was found to have peritoneal metastasis to right diaphragm and surgery was aborted. -Her CT AP from 11/29/18 shows dominate liver mass stable to mild increase, mild nodularity of both adrenal glands which warrants being monitored.  No visible peritoneal metastasis on CT scan. -FO results showed MSI stable disease, IDH mutation positive.  Consider IDH 1 inhibitor in the future.   -She began first line oxaliplatin (due to CKD) and gemcitabine every 2 weeks on 11/30/2018   -she responded very well  to treatment. Oxali was dose reduced and eventually stopped after cycle 11. She received single agent gemcitabine with cycle 12. Cisplatin added with C13 on 05/17/19 but neuropathy worsened and stopped on 06/14/19. -She has been receiving single agent gemcitabine every 2 weeks starting from 06/28/19 C16, tolerating well. She took chemo break from 10/21 - 1/22 -PET from 03/14/20 showed concern for disease progression in liver, she restarted maintenance gemcitabine every 2 weeks from 04/03/20. -I added durvalumab to gemcitabine, due to recurrent disease on PET 09/2020. She is tolerating chemotherapy well overall, except persistent neuropathy  -restaging PET on 01/02/21 showed further progression of cancer in liver. I reviewed the images with the patient today. -given the disease progression, we will discontinue her current treatment after today's infusion. We discussed other possible treatment options, such as Y90 or Xeloda. Due to her poor tolerance to chemo, no extra hepatic disease, I recommend Y90 next. I explained the Y90 procedure and the side effects with her. She would like to proceed. -she will receive gemcitabine today, then we will discontinue her future infusions.   2.  Grade 3 CIPN -secondary to Oxaliplatin, eventually dc'd after cycle 11.  Her diabetes may contributed to it also. -started cisplatin on 05/17/19, neuropathy worsened in her feet with unsteady gait and functional limitations, decreased vibratory sense on tuning fork exam 05/31/19. Cisplatin was dose reduced then eventually stopped on 06/14/19.  -low efficacy of gabapentin and cymbalta, also tried lyrica briefly.  She completed PT for balance. Ambulates with cane to avoid fall -She has functional difficulties and ambulates with a cane -Follow-up open with Dr. Mickeal Skinner, pt did not find helpful. Also seen by chiropractor -Continues to have decreased function, on gabapentin 100 mg as needed, she does not take much. -She went  to chronic  conditions center and was offered a type of treatment that would be $6000, she declined -Ineligible for [facility] [place] 2102 neuropathy clinical trial, I suggest her to take PEA off trial    3. H/o right breast cancer, Genetics negative  -s/p mastectomy in 1992, per patient did not require adjuvant therapy -previously followed by Dr. Jana Hakim  -VUS of gene APC; genetics otherwise normal   4. Comorbidities: DM, HTN, hiatal hernia, GERD, renal artery stenosis, CHF -Follow-up with PCP, nephrology, and Dr. Dwyane Dee, and cardiology -stable, well controlled -Her BP was elevated today. She endorses taking her medication as directed. She notes it is related to the stress of being here.   5. Family support -she has a son and daughter who are supportive. -patient remains independent    6. Goal of care discussion -We frequently discuss the goal of her treatment is palliative and that her cancer is not curable with chemo alone at this stage. The goal is to control her disease, prolong her life, and give her good quality of life. She understands -she is full code now    7. CKD Stage III -continue monitoring Scr 1.4 - 1.7     PLAN: -proceed with gemcitabine today then stop -referral to IR for Y90 -will cancel lab/flush on 11/9 and all future infusions -per pt preference, we will keep lab, flush, and f/u as scheduled in 4 weeks   No problem-specific Assessment & Plan notes found for this encounter.   SUMMARY OF ONCOLOGIC HISTORY: Oncology History Overview Note  Cancer Staging Intrahepatic cholangiocarcinoma ([place]) Staging form: Intrahepatic Bile Duct, AJCC 8th Edition - Clinical stage from 09/23/2018: Stage IB (cT1b, cN0, cM0) - Signed by [name], MD on 10/06/2018 Histologic grade (G): G3 Histologic grading system: 3 grade system    Intrahepatic cholangiocarcinoma ([place])  09/07/2018 Imaging   CT Chest IMPRESSION: 1. New, enhancing mass involving segment 4 of the liver and fundus of gallbladder is  concerning for malignancy. This may represent either metastatic disease from breast cancer or neoplasm primary to the liver or hepatic biliary tree. Further evaluation with contrast enhanced CT of the abdomen and pelvis is recommended. 2. No findings to suggest metastatic disease within the chest. 3.  Aortic Atherosclerosis (ICD10-I70.0). 4. Coronary artery calcifications.   09/13/2018 Pathology Results   Diagnosis Liver, needle/core biopsy - ADENOCARCINOMA. Microscopic Comment Immunohistochemistry for CK7 is positive. CK20, TTF1, CDX-2, GATA-3, PAX 8, Qualitative ER, p63 and CK5/6 are negative. The provided clinical history of remote mammary carcinoma is noted. Based on the morphology and immunophenotype of the adenocarcinoma observed in this specimen, primary cholangiocarcinoma is favored. Clinical and radiologic correlation are  encouraged. Results reported to [employer] on 09/15/2018. Intradepartmental consultation (Dr. Vic Ripper).   09/13/2018 Initial Diagnosis   Cholangiocarcinoma ([place])   09/23/2018 Procedure   Colonoscopy by Dr. Havery Moros 09/23/18  IMPRESSION - Two 3 to 4 mm polyps in the ascending colon, removed with a cold snare. Resected and retrieved. - Five 3 to 5 mm polyps in the transverse colon, removed with a cold snare. Resected and retrieved. - One 5 mm polyp at the splenic flexure, removed with a cold snare. Resected and retrieved. - Three 3 to 5 mm polyps in the sigmoid colon, removed with a cold snare. Resected and retrieved. - The examination was otherwise normal. Upper Endopscy by Dr. Havery Moros 09/23/18  IMPRESSION - Esophagogastric landmarks identified. - 2 cm hiatal hernia. - Normal esophagus otherwise. - A single gastric polyp. Resected and retrieved. -  Mild gastritis. Biopsied. - Normal duodenal bulb and second portion of the duodenum.   09/23/2018 Pathology Results   Diagnosis 09/23/18 1. Surgical [P], duodenum - BENIGN SMALL BOWEL MUCOSA. - NO ACTIVE  INFLAMMATION OR VILLOUS ATROPHY IDENTIFIED. 2. Surgical [P], stomach, polyp - HYPERPLASTIC POLYP(S). - THERE IS NO EVIDENCE OF MALIGNANCY. 3. Surgical [P], gastric antrum and gastric body - CHRONIC INACTIVE GASTRITIS. - THERE IS NO EVIDENCE OF HELICOBACTER-PYLORI, DYSPLASIA, OR MALIGNANCY. - SEE COMMENT. 4. Surgical [P], colon, sigmoid, splenic flexure, transverse and ascending, polyp (9) - TUBULAR ADENOMA(S). - SESSILE SERRATED POLYP WITHOUT CYTOLOGIC DYSPLASIA. - HIGH GRADE DYSPLASIA IS NOT IDENTIFIED. 5. Surgical [P], colon, sigmoid, polyp (2) - HYPERPLASTIC POLYP(S). - THERE IS NO EVIDENCE OF MALIGNANCY.   09/23/2018 Cancer Staging   Staging form: Intrahepatic Bile Duct, AJCC 8th Edition - Clinical stage from 09/23/2018: Stage IB (cT1b, cN0, cM0) - Signed by [name], MD on 10/06/2018    09/29/2018 PET scan   PET 09/29/18 IMPRESSION: 1. Hypermetabolic mass in the RIGHT hepatic lobe consistent with biopsy proven adenocarcinoma. No additional liver metastasis. 2. No evidence of local breast cancer recurrence in the RIGHT breast or RIGHT axilla. 3. Mild bilateral hypermetabolic adrenal glands is favored benign hyperplasia. 4. No evidence of additional metastatic disease on skull base to thigh FDG PET scan.   11/06/2018 Genetic Testing   Negative genetic testing on the [employer] Common Hereditary Cancers panel. A variant of uncertain significance was identified in one of her APC genes, called c.1243G>A (p.Ala415Thr).  The Common Hereditary Cancers Panel offered by [employer] includes sequencing and/or deletion duplication testing of the following 48 genes: APC, ATM, AXIN2, BARD1, BMPR1A, BRCA1, BRCA2, BRIP1, CDH1, CDK4, CDKN2A (p14ARF), CDKN2A (p16INK4a), CHEK2, CTNNA1, DICER1, EPCAM (Deletion/duplication testing only), GREM1 (promoter region deletion/duplication testing only), KIT, MEN1, MLH1, MSH2, MSH3, MSH6, MUTYH, NBN, NF1, NHTL1, PALB2, PDGFRA, PMS2, POLD1, POLE, PTEN, RAD50, RAD51C,  RAD51D, RNF43, SDHB, SDHC, SDHD, SMAD4, SMARCA4. STK11, TP53, TSC1, TSC2, and VHL.  The following genes were evaluated for sequence changes only: SDHA and HOXB13 c.251G>A variant only.    11/17/2018 Pathology Results   Diagnosis 11/17/18 1. Soft tissue, biopsy, Diaphragmatic nodules - METASTATIC ADENOCARCINOMA, CONSISTENT WITH PATIENT'S CLINICAL HISTORY OF CHOLANGIOCARCINOMA. SEE NOTE 2. Liver, biopsy, Left - LIVER PARENCHYMA WITH A BENIGN FIBROTIC NODULE - NO EVIDENCE OF MALIGNANCY 3. Stomach, biopsy - BENIGN PAPILLARY MESOTHELIAL HYPERPLASIA - NO EVIDENCE OF MALIGNANCY   11/29/2018 Imaging   CT CAP WO Contrast  IMPRESSION: 1. Dominant liver mass appears grossly stable from 09/29/2018. Additional liver lesions are too small to characterize but were not shown to be hypermetabolic on PET. 2. Mild nodularity of both adrenal glands with associated hypermetabolism on 09/29/2018. Continued attention on follow-up exams is warranted. 3. Small right lower lobe nodules, stable from 09/07/2018. Again, attention on follow-up is recommended. 4. Trace bilateral pleural fluid. 5. Aortic atherosclerosis (ICD10-170.0). Coronary artery calcification. 6. Enlarged pulmonic trunk, indicative of pulmonary arterial hypertension.     11/30/2018 - 06/14/2019 Chemotherapy   First line chemo Oxaliplatin and gemcitabine q2weeks starting 11/30/18. Stopped Oxaliplatin on 05/03/19 due to worsening neuropathy. Added Cisplatin with C13 on 05/17/19 and stopped on C15 06/14/19 due to neuropathy. Reduced to maintenance single agent Gemcitabine on 06/28/19.    01/20/2019 Imaging   CT CAP IMPRESSION: restaging  1. Mild interval increase in size of dominant liver mass involving segment 4 and segment 5. 2. No significant or progressive adrenal nodularity identified to suggest metastatic disease. 3. Unchanged appearance of small  right lower lobe and lingular lung nodules, nonspecific.   03/21/2019 PET scan   IMPRESSION: 1.  Large right hepatic mass with SUV uptake near background hepatic activity, dramatic response to therapy, also with decrease in size when compared to the prior study. 2. Signs of prior right mastectomy and axillary dissection as before. 3. Left adrenal activity in no longer above the level of activity seen in the contralateral, right adrenal gland or in the liver. Attention on follow-up. 4. No new signs of disease.   06/27/2019 PET scan   IMPRESSION: 1. Further decrease in size of a dominant hepatic mass which is non FDG avid. 2. No evidence of metastatic disease. 3. No evidence of left adrenal hypermetabolism or mass. 4. Incidental findings, including: Uterine fibroids. Coronary artery atherosclerosis. Aortic Atherosclerosis (ICD10-I70.0). Pulmonary artery enlargement suggests pulmonary arterial hypertension.   06/28/2019 -  Chemotherapy   Maintenance single agent Gemcitabine q2weeks starting on 06/28/19 with C16.  ------On chemo break since 12/27/19.  ------She restarted Single Agent Gemcitabine q2weeks on 04/03/20 due to mild disease progression in liver on 02/2020 PET    09/15/2019 PET scan   IMPRESSION: 1. In the region of regional tumor at the junction of the right and left hepatic lobe, there is continued hypodensity and overall activity slightly less than that of the surrounding normal liver, indicating effective response to therapy. No current worrisome hypermetabolic lesion is identified in the liver or elsewhere. 2. Chronic asymmetric edema in the subcutaneous tissues of the right upper extremity, nonspecific. The patient has had a prior right mastectomy and right axillary dissection. 3. Other imaging findings of potential clinical significance: Aortic Atherosclerosis (ICD10-I70.0). Coronary atherosclerosis. Mild cardiomegaly. Small right and trace left pleural effusions. Subcutaneous and mild mesenteric edema. Suspected uterine fibroids.   12/18/2019 PET scan    IMPRESSION: 1. No significant abnormal activity to suggest active/recurrent malignancy. 2. Lingual tonsillar activity is mildly increased from prior but probably incidental/physiologic, attention on follow up suggested. 3. Other imaging findings of potential clinical significance: Third spacing of fluid with trace ascites, mesenteric edema, subcutaneous edema, and possibly subtle pulmonary edema. Mild cardiomegaly. Aortic Atherosclerosis (ICD10-I70.0). Coronary atherosclerosis. Right ovarian dermoid. Calcified uterine fibroids. Renal cysts. Right glenohumeral arthropathy. Mild to moderate scoliosis. Suspected pulmonary arterial hypertension.   03/14/2020 PET scan   IMPRESSION: 1. New small focus of hypermetabolism in segment 4A of the liver worrisome for new metastatic focus. 2. No other findings for metastatic disease involving the neck, chest, abdomen or pelvis.     06/11/2020 PET scan   IMPRESSION: 1. No suspicious hypermetabolic activity within the neck, chest, abdomen or pelvis. 2. The treated dominant peripheral cholangiocarcinoma appears slightly enlarged compared with recent prior studies, and there is a questionable new lesion more superiorly in the left lobe. Although without associated hypermetabolic activity, these lesions are suspicious and would be better assessed with abdominal MRI without and with contrast.   09/17/2020 PET scan   IMPRESSION: 1. Recurrent hypermetabolic activity around the periphery of the dominant hepatic mass consistent with local recurrence. This mass appears slightly larger. Possible small metastasis in the dome of the left hepatic lobe is unchanged in size, without hypermetabolic activity. 2. No distant metastases identified. 3. Stable incidental findings as detailed above.   10/16/2020 -  Chemotherapy   Patient is on Treatment Plan : BLADDER Durvalumab q28d     01/02/2021 PET scan   IMPRESSION: 1. Further enlargement of hypermetabolic hepatic  mass superior to the cholecystectomy bed consistent with progressive local tumor  recurrence. Satellite lesion more superiorly in the anterior dome of the liver has also enlarged, without definite hypermetabolic activity. 2. No distant metastases identified. 3. Stable incidental findings including aortic atherosclerosis, hepatic and renal cysts and uterine fibroids.      INTERVAL HISTORY:  Donna May is here for a follow up of cholangiocarcinoma. She was last seen by me on 12/25/20. She presents to the clinic alone. She reports she is okay. Her affect is flat overall.   All other systems were reviewed with the patient and are negative.  MEDICAL HISTORY:  Past Medical History:  Diagnosis Date   Anemia    Anxiety    Arthritis    Breast cancer ([place]) 1992   right   Cataract    Bilateral eyes - surgery to remove   Chronic diastolic CHF (congestive heart failure) (HCC)    Chronic kidney disease    CKD stage 3   Complication of anesthesia    "something they use make me itch for a couple of days."   Depression    Diabetes mellitus    type 2   Duodenitis 01/18/2002   Fainting spell    GERD (gastroesophageal reflux disease)    Heart murmur    never has caused any problems   Hiatal hernia 08/08/2008, 01/18/2002   History of pneumonia    x 2   Hyperlipidemia    Hypertension    Liver cancer ([place]) 08/2018   chemotherapy   Lymphedema 2017   Right arm   Peripheral neuropathy     SURGICAL HISTORY: Past Surgical History:  Procedure Laterality Date   AXILLARY SURGERY     cyst removal, right   COLONOSCOPY  09/23/2018   Dr. Havery Moros - polyps   EYE SURGERY Bilateral    cataracts to remove   LAPAROSCOPY N/A 11/17/2018   Procedure: LAPAROSCOPY DIAGNOSTIC, INTRAOPERATIVE ULTRASOUND, PERITONEAL BIOPSIES;  Surgeon: [name], MD;  Location: [facility];  Service: General;  Laterality: N/A;  GENERAL AND EPIDURAL   LIVER BIOPSY  08/2018   Dr. [place] Coke   MASTECTOMY  1992   right,  with flap   NM [place]  06/11/2009   Protocol:[place], post stress EF58%, EKG negative for ischemia, low risk   PORTACATH PLACEMENT N/A 12/02/2018   Procedure: INSERTION PORT-A-CATH;  Surgeon: [name], MD;  Location: [facility];  Service: General;  Laterality: N/A;   RECONSTRUCTION BREAST W/ TRAM FLAP Right    TONSILLECTOMY  1958   TRANSTHORACIC ECHOCARDIOGRAM  12/24/2009   LVEF =>55%, normal study   UPPER GI ENDOSCOPY      I have reviewed the social history and family history with the patient and they are unchanged from previous note.  ALLERGIES:  has No Known Allergies.  MEDICATIONS:  Current Outpatient Medications  Medication Sig Dispense Refill   amLODipine (NORVASC) 10 MG tablet Take 1 tablet (10 mg total) by mouth daily. 90 tablet 3   aspirin 81 MG tablet Take 81 mg by mouth daily.      Baclofen 5 MG TABS TAKE 1/2 TABLET BY MOUTH ONCE DAILY AS NEEDED 42 tablet 1   carvedilol (COREG) 25 MG tablet Take 1 tablet (25 mg total) by mouth 2 (two) times daily with a meal. 180 tablet 3   furosemide (LASIX) 40 MG tablet Take 1 tablet (40 mg total) by mouth daily. 30 tablet 11   gabapentin (NEURONTIN) 100 MG capsule Take 1 capsule (100 mg total) by mouth every 8 (eight) hours as needed.  180 capsule 2   glucose blood test strip 1 each by Other route 2 (two) times daily. Use Onetouch verio test strips as instructed to check blood sugar twice daily. 100 each 2   hydrALAZINE (APRESOLINE) 100 MG tablet TAKE THREE TIMES DAILY-AM, MIDDLE OF THE DAY AND IN THE EVENING. 270 tablet 3   KLOR-CON M10 10 MEQ tablet TAKE 1 TABLET BY MOUTH EVERY DAY 90 tablet 1   lansoprazole (PREVACID) 15 MG capsule Take 1 capsule (15 mg total) by mouth daily at 12 noon. Take 1 capsule by mouth once or twice a day as needed 90 capsule 3   lidocaine-prilocaine (EMLA) cream Apply 1 application topically as needed. 30 g 1   loratadine (CLARITIN) 10 MG tablet Take 10 mg by mouth daily as needed for allergies.      Omega 3 1000 MG CAPS Take 2,000 mg by mouth daily.      ondansetron (ZOFRAN-ODT) 4 MG disintegrating tablet Take 1 tablet (4 mg total) by mouth every 8 (eight) hours as needed for nausea or vomiting. 30 tablet 1   polyethylene glycol (MIRALAX / GLYCOLAX) 17 g packet Take 17 g by mouth daily as needed. 14 each 0   sitaGLIPtin (JANUVIA) 100 MG tablet Take 1 tablet (100 mg total) by mouth daily. 90 tablet 3   valsartan (DIOVAN) 320 MG tablet Take 1 tablet (320 mg total) by mouth daily. 90 tablet 3   Current Facility-Administered Medications  Medication Dose Route Frequency Provider Last Rate Last Admin   triamcinolone acetonide (KENALOG) 10 MG/ML injection 10 mg  10 mg Other Once Harriet Masson, DPM        PHYSICAL EXAMINATION: ECOG PERFORMANCE STATUS: 2 - Symptomatic, <50% confined to bed  Vitals:   01/08/21 0924  BP: (!) 158/56  Pulse: 75  Resp: 18  Temp: 98.1 F (36.7 C)  SpO2: 100%   Wt Readings from Last 3 Encounters:  01/08/21 60.1 kg  12/25/20 58.1 kg  12/11/20 59.3 kg     GENERAL:alert, no distress and comfortable SKIN: skin color normal, no rashes or significant lesions EYES: normal, Conjunctiva are pink and non-injected, sclera clear  NEURO: alert & oriented x 3 with fluent speech  LABORATORY DATA:  I have reviewed the data as listed CBC Latest Ref Rng & Units 01/08/2021 12/25/2020 12/11/2020  WBC 4.0 - 10.5 K/uL 5.6 5.1 6.4  Hemoglobin 12.0 - 15.0 g/dL 9.5(L) 9.4(L) 10.1(L)  Hematocrit 36.0 - 46.0 % 29.4(L) 29.7(L) 32.0(L)  Platelets 150 - 400 K/uL 196 182 207     CMP Latest Ref Rng & Units 01/08/2021 12/25/2020 12/11/2020  Glucose 70 - 99 mg/dL 102(H) 107(H) 86  BUN 8 - 23 mg/dL 38(H) 27(H) 32(H)  Creatinine 0.44 - 1.00 mg/dL 1.55(H) 1.30(H) 1.45(H)  Sodium 135 - 145 mmol/L 143 144 143  Potassium 3.5 - 5.1 mmol/L 4.5 4.0 4.5  Chloride 98 - 111 mmol/L 110 111 109  CO2 22 - 32 mmol/L $RemoveB'23 27 24  'joovUMgz$ Calcium 8.9 - 10.3 mg/dL 9.1 9.4 9.5  Total Protein 6.5 - 8.1  g/dL 7.0 6.9 7.6  Total Bilirubin 0.3 - 1.2 mg/dL 0.3 0.3 0.3  Alkaline Phos 38 - 126 U/L 67 52 74  AST 15 - 41 U/L $Remo'16 21 23  'wKdAK$ ALT 0 - 44 U/L $Remo'7 10 10      'iyJIL$ RADIOGRAPHIC STUDIES: I have personally reviewed the radiological images as listed and agreed with the findings in the report. No results found.    Orders  Placed This Encounter  Procedures   Ambulatory referral to Interventional Radiology    Referral Priority:   Routine    Referral Type:   Consultation    Referral Reason:   Specialty Services Required    Requested Specialty:   Interventional Radiology    Number of Visits Requested:   1   All questions were answered. The patient knows to call the clinic with any problems, questions or concerns. No barriers to learning was detected. The total time spent in the appointment was 30 minutes.     [name], MD 01/08/2021   I, Wilburn Mylar, am acting as scribe for [name], MD.   I have reviewed the above documentation for accuracy and completeness, and I agree with the above.

## 2021-01-08 NOTE — Patient Instructions (Signed)
[facility]ICAL [facility]COL[facility]  Discharge Instructions: Thank you for choosing [facility] to provide your oncology and hematology care.   If you have a lab appointment with the Cancer Center, please go directly to the Cancer Center and check in at the registration area.   Wear comfortable clothing and clothing appropriate for easy access to any Portacath or PICC line.   We strive to give you quality time with your provider. You may need to reschedule your appointment if you arrive late (15 or more minutes).  Arriving late affects you and other patients whose appointments are after yours.  Also, if you miss three or more appointments without notifying the office, you may be dismissed from the clinic at the provider's discretion.      For prescription refill requests, have your pharmacy contact our office and allow 72 hours for refills to be completed.    Today you received the following chemotherapy and/or immunotherapy agents Gemzar      To help prevent nausea and vomiting after your treatment, we encourage you to take your nausea medication as directed.  BELOW ARE SYMPTOMS THAT SHOULD BE REPORTED IMMEDIATELY: *FEVER GREATER THAN 100.4 F (38 C) OR HIGHER *CHILLS OR SWEATING *NAUSEA AND VOMITING THAT IS NOT CONTROLLED WITH YOUR NAUSEA MEDICATION *UNUSUAL SHORTNESS OF BREATH *UNUSUAL BRUISING OR BLEEDING *URINARY PROBLEMS (pain or burning when urinating, or frequent urination) *BOWEL PROBLEMS (unusual diarrhea, constipation, pain near the anus) TENDERNESS IN MOUTH AND THROAT WITH OR WITHOUT PRESENCE OF ULCERS (sore throat, sores in mouth, or a toothache) UNUSUAL RASH, SWELLING OR PAIN  UNUSUAL VAGINAL DISCHARGE OR ITCHING   Items with * indicate a potential emergency and should be followed up as soon as possible or go to the Emergency Department if any problems should occur.  Please show the CHEMOTHERAPY ALERT CARD or IMMUNOTHERAPY ALERT CARD at check-in to the  Emergency Department and triage nurse.  Should you have questions after your visit or need to cancel or reschedule your appointment, please contact [facility]  Dept: [phone]  and follow the prompts.  Office hours are 8:00 a.m. to 4:30 p.m. Monday - Friday. Please note that voicemails left after 4:00 p.m. may not be returned until the following business day.  We are closed weekends and major holidays. You have access to a nurse at all times for urgent questions. Please call the main number to the clinic Dept: [phone] and follow the prompts.   For any non-urgent questions, you may also contact your provider using MyChart. We now offer e-Visits for anyone 18 and older to request care online for non-urgent symptoms. For details visit [email or web address].   Also download the MyChart app! Go to the app store, search "MyChart", open the app, select [facility], and log in with your MyChart username and password.  Due to Covid, a mask is required upon entering the hospital/clinic. If you do not have a mask, one will be given to you upon arrival. For doctor visits, patients may have 1 support person aged 18 or older with them. For treatment visits, patients cannot have anyone with them due to current Covid guidelines and our immunocompromised population.   

## 2021-01-08 NOTE — Progress Notes (Signed)
Okay to treat with elevated creat

## 2021-01-20 ENCOUNTER — Other Ambulatory Visit: Payer: Self-pay | Admitting: Hematology

## 2021-01-20 ENCOUNTER — Other Ambulatory Visit: Payer: Self-pay

## 2021-01-20 DIAGNOSIS — C221 Intrahepatic bile duct carcinoma: Secondary | ICD-10-CM

## 2021-01-22 ENCOUNTER — Ambulatory Visit: Payer: Medicare Other

## 2021-01-22 ENCOUNTER — Other Ambulatory Visit: Payer: Medicare Other

## 2021-01-30 ENCOUNTER — Encounter: Payer: Self-pay | Admitting: *Deleted

## 2021-01-30 ENCOUNTER — Ambulatory Visit
Admission: RE | Admit: 2021-01-30 | Discharge: 2021-01-30 | Disposition: A | Discharge status: Home / Self Care | Payer: Medicare Other | Source: Ambulatory Visit | Attending: Hematology | Admitting: Hematology

## 2021-01-30 DIAGNOSIS — C221 Intrahepatic bile duct carcinoma: Secondary | ICD-10-CM | POA: Diagnosis not present

## 2021-01-30 HISTORY — PX: IR RADIOLOGIST EVAL & MGMT: IMG5224

## 2021-01-30 NOTE — Procedures (Signed)
Chief Complaint: Intrahepatic cholangiocarcinoma  Referring Physician(s): Feng,Yan  History of Present Illness: Donna May is a 80 y.o. female with past medical history significant for chronic kidney disease, diabetes, hyperlipidemia, hypertension, breast cancer, peripheral neuropathy, chronic diastolic CHF, who presents today to the interventional radiology clinic for evaluation for potential percutaneous management of intrahepatic cholangiocarcinoma.  In brief review, patient was diagnosed with cholangiocarcinoma on liver biopsy performed 10/03/2018.  Patient initially underwent attempted surgical resection however was found to have peritoneal metastasis at the time of the surgery and as such the surgery was aborted.  Since that time, the patient has undergone several rounds of systemic therapy, most recently gemcitabine with durvalumab, which overall she tolerated well except for persistent neuropathy primarily affecting her hands and lower legs.  Unfortunately, PET/CT performed 01/02/2021 demonstrates progression of disease, though fortunately, the disease appears limited to the liver and as such, the patient has been referred to interventional radiology for liver directed therapy.  From a functional status, the patient lives independently and ambulates with a single-point cane.   Past Medical History:  Diagnosis Date   Anemia    Anxiety    Arthritis    Breast cancer ([place]) 1992   right   Cataract    Bilateral eyes - surgery to remove   Chronic diastolic CHF (congestive heart failure) (HCC)    Chronic kidney disease    CKD stage 3   Complication of anesthesia    "something they use make me itch for a couple of days."   Depression    Diabetes mellitus    type 2   Duodenitis 01/18/2002   Fainting spell    GERD (gastroesophageal reflux disease)    Heart murmur    never has caused any problems   Hiatal hernia 08/08/2008, 01/18/2002   History of pneumonia    x 2    Hyperlipidemia    Hypertension    Liver cancer ([place]) 08/2018   chemotherapy   Lymphedema 2017   Right arm   Peripheral neuropathy     Past Surgical History:  Procedure Laterality Date   AXILLARY SURGERY     cyst removal, right   COLONOSCOPY  09/23/2018   Dr. Havery Moros - polyps   EYE SURGERY Bilateral    cataracts to remove   LAPAROSCOPY N/A 11/17/2018   Procedure: LAPAROSCOPY DIAGNOSTIC, INTRAOPERATIVE ULTRASOUND, PERITONEAL BIOPSIES;  Surgeon: [name], MD;  Location: [facility];  Service: General;  Laterality: N/A;  GENERAL AND EPIDURAL   LIVER BIOPSY  08/2018   Dr. Lindwood Coke   MASTECTOMY  1992   right, with flap   NM [place]  06/11/2009   Protocol:[place], post stress EF58%, EKG negative for ischemia, low risk   PORTACATH PLACEMENT N/A 12/02/2018   Procedure: INSERTION PORT-A-CATH;  Surgeon: [name], MD;  Location: [facility];  Service: General;  Laterality: N/A;   RECONSTRUCTION BREAST W/ TRAM FLAP Right    TONSILLECTOMY  1958   TRANSTHORACIC ECHOCARDIOGRAM  12/24/2009   LVEF =>55%, normal study   UPPER GI ENDOSCOPY      Allergies: Patient has no known allergies.  Medications: Prior to Admission medications   Medication Sig Start Date End Date Taking? Authorizing Provider  amLODipine (NORVASC) 10 MG tablet Take 1 tablet (10 mg total) by mouth daily. 10/22/19   Danford, Suann Larry, MD  aspirin 81 MG tablet Take 81 mg by mouth daily.     [provider]  Baclofen 5 MG TABS TAKE 1/2  TABLET BY MOUTH ONCE DAILY AS NEEDED 12/20/20   [name], MD  carvedilol (COREG) 25 MG tablet Take 1 tablet (25 mg total) by mouth 2 (two) times daily with a meal. 02/15/20   [name], Mihai, MD  furosemide (LASIX) 40 MG tablet Take 1 tablet (40 mg total) by mouth daily. 02/22/20   [name], Mihai, MD  gabapentin (NEURONTIN) 100 MG capsule Take 1 capsule (100 mg total) by mouth every 8 (eight) hours as needed. 05/15/20   [name], MD  glucose blood test strip 1 each by  Other route 2 (two) times daily. Use Onetouch verio test strips as instructed to check blood sugar twice daily. 01/05/19   Elayne Snare, MD  hydrALAZINE (APRESOLINE) 100 MG tablet TAKE THREE TIMES DAILY-AM, MIDDLE OF THE DAY AND IN THE EVENING. 09/13/20   [name], Mihai, MD  KLOR-CON M10 10 MEQ tablet TAKE 1 TABLET BY MOUTH EVERY DAY 03/04/20   [name], Mihai, MD  lansoprazole (PREVACID) 15 MG capsule Take 1 capsule (15 mg total) by mouth daily at 12 noon. Take 1 capsule by mouth once or twice a day as needed 11/01/20   [name], [name], MD  lidocaine-prilocaine (EMLA) cream Apply 1 application topically as needed. 08/21/20   [name], MD  loratadine (CLARITIN) 10 MG tablet Take 10 mg by mouth daily as needed for allergies.    [provider]  Omega 3 1000 MG CAPS Take 2,000 mg by mouth daily.     [provider]  ondansetron (ZOFRAN-ODT) 4 MG disintegrating tablet Take 1 tablet (4 mg total) by mouth every 8 (eight) hours as needed for nausea or vomiting. 11/01/20   [name], [name], MD  polyethylene glycol (MIRALAX / GLYCOLAX) 17 g packet Take 17 g by mouth daily as needed. 11/01/20   [name], [name], MD  sitaGLIPtin (JANUVIA) 100 MG tablet Take 1 tablet (100 mg total) by mouth daily. 04/08/20   [name], MD  valsartan (DIOVAN) 320 MG tablet Take 1 tablet (320 mg total) by mouth daily. 02/07/20   Deberah Pelton, NP     Family History  Problem Relation Age of Onset   Breast cancer Cousin        diagnosed >50; mother's first cousins   Diabetes Brother    Heart disease Brother    Heart disease Mother    Hypertension Mother    Multiple sclerosis Mother    Heart disease Father    Hypertension Father    Stroke Father    Prostate cancer Brother 64   Heart attack Maternal Grandmother    Stroke Maternal Grandfather    Colon cancer Neg Hx    Esophageal cancer Neg Hx    Stomach cancer Neg Hx    Rectal cancer Neg Hx     Social History   Socioeconomic  History   Marital status: Single    Spouse name: Not on file   Number of children: 2   Years of education: 12   Highest education level: Not on file  Occupational History   Occupation: retired    Comment: disabled  Tobacco Use   Smoking status: Former    Packs/day: 0.25    Years: 25.00    Pack years: 6.25    Types: Cigarettes    Quit date: 1990    Years since quitting: 32.8   Smokeless tobacco: Never  Vaping Use   Vaping Use: Never used  Substance and Sexual Activity   Alcohol use: Not Currently  Alcohol/week: 0.0 standard drinks   Drug use: Never   Sexual activity: Yes    Partners: Male    Birth control/protection: Condom, Post-menopausal    Comment: First sexual encounter age 86. Fewer than 5 partners in life time.  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   Has had 3 Covid vaccines   Social Determinants of Radio broadcast assistant Strain: Low Risk    Difficulty of Paying Living Expenses: Not hard at all  Food Insecurity: No Food Insecurity   Worried About Charity fundraiser in the Last Year: Never true   Arboriculturist in the Last Year: Never true  Transportation Needs: No Transportation Needs   Lack of Transportation (Medical): No   Lack of Transportation (Non-Medical): No  Physical Activity: Inactive   Days of Exercise per Week: 0 days   Minutes of Exercise per Session: 0 min  Stress: No Stress Concern Present   Feeling of Stress : Not at all  Social Connections: Moderately Isolated   Frequency of Communication with Friends and Family: More than three times a week   Frequency of Social Gatherings with Friends and Family: More than three times a week   Attends Religious Services: More than 4 times per year   Active Member of  [employer] or Organizations: No   Attends Archivist Meetings: Never   Marital Status: Widowed    ECOG Status: 1 - Symptomatic but completely ambulatory  Review of Systems  Review of Systems: A 12 point ROS discussed and pertinent positives are indicated in the HPI above.  All other systems are negative.  Physical Exam No direct physical exam was performed (except for noted visual exam findings with Video Visits).   Vital Signs: LMP  (LMP Unknown)   Imaging:  The following examinations were personally reviewed in detail:  PET/CT-01/02/2021; 09/17/2020 chest CT-09/07/2018 ultrasound-guided liver lesion biopsy-09/13/2018  PET/CT performed 01/02/2021 it demonstrates interval increase in size of hypoattenuating lesion adjacent to the dome of the gallbladder fossa measuring approximately 7.0 x 3.6 cm (image 96, series 4), previously, 5.9 x 3.3 cm.    Additionally, there is been interval enlargement of an adjacent satellite lesion within the more cranial aspect of the anterior dome of the medial segment of the left lobe of the liver now measuring approximately 3.9 x 3.1 cm (image 91, series 4), previously, 2.9 x 2.4 cm, as well as an additional tiny lesion within the dome of the liver now measuring 1.4 cm (image 83, series 4), previously, 0.9 cm.    Evaluation for patency of the portal vein cannot be determined on the noncontrast PET CT.  Note, the lesion was found to be significantly hypervascular and remote contrast-enhanced chest CT performed 09/06/2020.    There are no additional extrahepatic sites of abnormal hypermetabolic activity to suggest extrahepatic metastatic disease, though again, patient's attempted surgical resection was aborted secondary to the discovery of peritoneal metastasis.  NM PET Image Restage (PS) Skull Base to Thigh (F-18 FDG)  Result Date: 01/03/2021 CLINICAL DATA:  Subsequent treatment strategy for intrahepatic cholangiocarcinoma with chemotherapy ongoing.  History of right breast cancer. EXAM: NUCLEAR MEDICINE PET SKULL BASE TO THIGH TECHNIQUE: 6.38 mCi F-18 FDG was  injected intravenously. Full-ring PET imaging was performed from the skull base to thigh after the radiotracer. CT data was obtained and used for attenuation correction and anatomic localization. Fasting blood glucose: 97 mg/dl COMPARISON:  PET-CT 09/17/2020 FINDINGS: Mediastinal blood pool activity: SUV max 2.3 Liver activity: SUV max 2.8 NECK: No hypermetabolic cervical lymph nodes are identified.There are no lesions of the pharyngeal mucosal space. Incidental CT findings: Stable mild heterogeneity of the thyroid gland without hypermetabolic activity. Bilateral carotid atherosclerosis. CHEST: There are no hypermetabolic mediastinal, hilar or axillary lymph nodes. No hypermetabolic pulmonary activity or suspicious nodularity. Incidental CT findings: Stable postsurgical changes in the right breast and right axilla. Left subclavian Port-A-Cath extends to the lower SVC. Mild atherosclerosis of the aorta, great vessels and coronary arteries. The lungs appear clear. ABDOMEN/PELVIS: There is an enlarging infiltrative mass superior to the gallbladder fossa, involving segments 4, 5 and 8. This measures up to 7.0 x 3.6 cm on image 96/4 (previously 5.9 x 3.3 cm). This mass is mildly hypermetabolic with an SUV max of 5.2 laterally and 4.6 superiorly. The greatest hypermetabolic activity previously was 4.8. A low-density lesion in segment 8 has mildly enlarged, measuring 1.4 cm on image 83/4 (previously 1.0 cm), although demonstrates no definite hypermetabolic activity. Additional low-density hepatic cysts are grossly stable. There is no hypermetabolic activity within the adrenal glands, pancreas or spleen. There is no hypermetabolic nodal activity. Incidental CT findings: Bilateral renal cysts, uterine fibroids and diffuse aortic and branch vessel atherosclerosis are again noted. No ascites or peritoneal nodularity.  SKELETON: There is no hypermetabolic activity to suggest osseous metastatic disease. Prominent muscular activity as before, within physiologic limits. Incidental CT findings: none IMPRESSION: 1. Further enlargement of hypermetabolic hepatic mass superior to the cholecystectomy bed consistent with progressive local tumor recurrence. Satellite lesion more superiorly in the anterior dome of the liver has also enlarged, without definite hypermetabolic activity. 2. No distant metastases identified. 3. Stable incidental findings including aortic atherosclerosis, hepatic and renal cysts and uterine fibroids. Electronically Signed   By: Richardean Sale M.D.   On: 01/03/2021 17:30    Labs:  CBC: Recent Labs    11/27/20 0905 12/11/20 0903 12/25/20 0844 01/08/21 0901  WBC 6.1 6.4 5.1 5.6  HGB 9.4* 10.1* 9.4* 9.5*  HCT 29.9* 32.0* 29.7* 29.4*  PLT 195 207 182 196    COAGS: No results for input(s): INR, APTT in the last 8760 hours.  BMP: Recent Labs    11/27/20 0905 12/11/20 0903 12/25/20 0844 01/08/21 0901  NA 141 143 144 143  K 4.2 4.5 4.0 4.5  CL 108 109 111 110  CO2 24 24 27 23   GLUCOSE 110* 86 107* 102*  BUN 28* 32* 27* 38*  CALCIUM 9.3 9.5 9.4 9.1  CREATININE 1.46* 1.45* 1.30* 1.55*  GFRNONAA 36* 36* 41* 33*    LIVER FUNCTION TESTS: Recent Labs    11/27/20 0905 12/11/20 0903 12/25/20 0844 01/08/21 0901  BILITOT 0.3 0.3 0.3 0.3  AST 16 23 21 16   ALT 8 10 10 7   ALKPHOS 67 74 52 67  PROT 7.3 7.6 6.9 7.0  ALBUMIN 3.3* 3.6 3.5 3.5    TUMOR MARKERS: No results for input(s): AFPTM, CEA, CA199, CHROMGRNA in the last 8760 hours.  Assessment and Plan:  Donna May is a 80 y.o. female with past medical history significant for chronic kidney disease, diabetes, hyperlipidemia, hypertension, breast cancer, peripheral neuropathy, chronic diastolic CHF, who presents today for percutaneous management of intrahepatic cholangiocarcinoma.  The  following examinations were personally  reviewed in detail:  PET/CT - 01/02/2021; 09/17/2020 chest CT - 09/07/2018 ultrasound-guided liver lesion biopsy - 09/13/2018  PET/CT performed 01/02/2021 it demonstrates interval increase in size of hypoattenuating lesion adjacent to the dome of the gallbladder fossa measuring approximately 7.0 x 3.6 cm (image 96, series 4), previously, 5.9 x 3.3 cm.    Additionally, there is been interval enlargement of an adjacent satellite lesion within the more cranial aspect of the anterior dome of the medial segment of the left lobe of the liver now measuring approximately 3.9 x 3.1 cm (image 91, series 4), previously, 2.9 x 2.4 cm, as well as an additional tiny lesion within the dome of the liver now measuring 1.4 cm (image 83, series 4), previously, 0.9 cm.    Evaluation for patency of the portal vein cannot be determined on the noncontrast PET CT.  Note, the lesion was found to be significantly hypervascular and remote contrast-enhanced chest CT performed 09/06/2020.    There are no additional extrahepatic sites of abnormal hypermetabolic activity to suggest extrahepatic metastatic disease, though again, patient's attempted surgical resection was aborted secondary to the discovery of peritoneal metastasis.  The patient's LFTs have remained normal (bilirubin was 0.3 on 10/26 at 2022), however the patient has chronic renal insufficiency with most recent creatinine of 1.5 obtained on 10/262022.  Given the size of the lesion (greater than 4 cm) the patient is NOT a candidate for ablative technology.  As such, prolonged conversations were held with the patient regarding the benefits and risks (including but not limited to nontarget embolization, bleeding/vessel injury, worsening liver and/or renal function) of Y 90 radioembolization.  I explained that the procedure is performed for palliative purposes in hopes of achieving disease stability/improvement for some length of time.  I explained that I would first  proceed with obtaining a contrast-enhanced CT scan of the abdomen pelvis though given her chronic renal insufficiency, we may have to reengage her nephrologist.  Ultimately, Y 90 radioembolization entails a mapping/safety procedure followed by either 1 or 2 definitive Y 90 radioembolizations.  I explained that the procedure is performed at [facility] on outpatient basis.  Typical recovery from the definitive treatments include a 7 to 10 day recovery at which time she could experience postembolization syndrome such as decreased appetite and energy level as well as potentially a low-grade fever.  The procedure is rarely associated with pain though the patient does still have a gallbladder which is immediately adjacent to her cholangiocarcinoma.  Following this prolonged and detailed conversation, the patient wishes to pursue Y 35 radioembolization.  And as such we will initiate the insurance authorization process.  Plan: - Refer pt back to her nephrologist for their input regarding her candidacy for receiving intravenous contrast (both for planning CTA as well as Y 90 arteriograms). -Once nephrology input is obtained, schedule multi phase cirrhotic protocol CT scan of the abdomen and pelvis for Y 90 planning purposes. - Schedule mapping Y 90 radioembolization at [facility].  Based on the planning CT we will have a better idea whether the patient requires 1 or 2 separate definitive Y 90 radioembolization sessions.  Thank you for this interesting consult.  I greatly enjoyed meeting Donna May and look forward to participating in their care.  A copy of this report was sent to the requesting provider on this date.  Electronically Signed: Sandi Mariscal 01/30/2021, 9:15 AM   I spent a total of 30 Minutes in direct  clinical consultation, greater than 50% of which was counseling/coordinating care for percutaneous management of cholangiocarcinoma.

## 2021-02-04 ENCOUNTER — Inpatient Hospital Stay: Payer: Medicare Other | Attending: Adult Health

## 2021-02-04 ENCOUNTER — Other Ambulatory Visit: Payer: Self-pay

## 2021-02-04 ENCOUNTER — Inpatient Hospital Stay (HOSPITAL_BASED_OUTPATIENT_CLINIC_OR_DEPARTMENT_OTHER): Payer: Medicare Other | Admitting: Hematology

## 2021-02-04 ENCOUNTER — Encounter: Payer: Self-pay | Admitting: Hematology

## 2021-02-04 ENCOUNTER — Ambulatory Visit: Payer: Medicare Other

## 2021-02-04 VITALS — BP 158/62 | HR 62 | Temp 98.1°F | Resp 18 | Ht 64.0 in | Wt 130.5 lb

## 2021-02-04 DIAGNOSIS — T451X5A Adverse effect of antineoplastic and immunosuppressive drugs, initial encounter: Secondary | ICD-10-CM

## 2021-02-04 DIAGNOSIS — C786 Secondary malignant neoplasm of retroperitoneum and peritoneum: Secondary | ICD-10-CM | POA: Diagnosis not present

## 2021-02-04 DIAGNOSIS — G620 Drug-induced polyneuropathy: Secondary | ICD-10-CM | POA: Insufficient documentation

## 2021-02-04 DIAGNOSIS — C221 Intrahepatic bile duct carcinoma: Secondary | ICD-10-CM | POA: Diagnosis not present

## 2021-02-04 DIAGNOSIS — Z95828 Presence of other vascular implants and grafts: Secondary | ICD-10-CM

## 2021-02-04 LAB — CMP (CANCER CENTER ONLY)
ALT: 9 U/L (ref 0–44)
AST: 17 U/L (ref 15–41)
Albumin: 3.6 g/dL (ref 3.5–5.0)
Alkaline Phosphatase: 74 U/L (ref 38–126)
Anion gap: 6 (ref 5–15)
BUN: 35 mg/dL — ABNORMAL HIGH (ref 8–23)
CO2: 26 mmol/L (ref 22–32)
Calcium: 9 mg/dL (ref 8.9–10.3)
Chloride: 109 mmol/L (ref 98–111)
Creatinine: 1.53 mg/dL — ABNORMAL HIGH (ref 0.44–1.00)
GFR, Estimated: 34 mL/min — ABNORMAL LOW (ref >60)
Glucose, Bld: 106 mg/dL — ABNORMAL HIGH (ref 70–99)
Potassium: 4.4 mmol/L (ref 3.5–5.1)
Sodium: 141 mmol/L (ref 135–145)
Total Bilirubin: <0.2 mg/dL — ABNORMAL LOW (ref 0.3–1.2)
Total Protein: 7.1 g/dL (ref 6.5–8.1)

## 2021-02-04 LAB — CBC WITH DIFFERENTIAL (CANCER CENTER ONLY)
Abs Immature Granulocytes: 0.02 10*3/uL (ref 0.00–0.07)
Basophils Absolute: 0 10*3/uL (ref 0.0–0.1)
Basophils Relative: 1 %
Eosinophils Absolute: 0.3 10*3/uL (ref 0.0–0.5)
Eosinophils Relative: 5 %
HCT: 32.9 % — ABNORMAL LOW (ref 36.0–46.0)
Hemoglobin: 10.3 g/dL — ABNORMAL LOW (ref 12.0–15.0)
Immature Granulocytes: 0 %
Lymphocytes Relative: 21 %
Lymphs Abs: 1.1 10*3/uL (ref 0.7–4.0)
MCH: 30.7 pg (ref 26.0–34.0)
MCHC: 31.3 g/dL (ref 30.0–36.0)
MCV: 98.2 fL (ref 80.0–100.0)
Monocytes Absolute: 0.7 10*3/uL (ref 0.1–1.0)
Monocytes Relative: 14 %
Neutro Abs: 3.2 10*3/uL (ref 1.7–7.7)
Neutrophils Relative %: 59 %
Platelet Count: 247 10*3/uL (ref 150–400)
RBC: 3.35 MIL/uL — ABNORMAL LOW (ref 3.87–5.11)
RDW: 16.3 % — ABNORMAL HIGH (ref 11.5–15.5)
WBC Count: 5.4 10*3/uL (ref 4.0–10.5)
nRBC: 0 % (ref 0.0–0.2)

## 2021-02-04 MED ORDER — SODIUM CHLORIDE 0.9% FLUSH
10.0000 mL | Freq: Once | INTRAVENOUS | Status: AC
  Administered 2021-02-04: 10 mL

## 2021-02-04 MED ORDER — TIBSOVO 250 MG PO TABS
500.0000 mg | ORAL_TABLET | Freq: Every day | ORAL | 0 refills | Status: DC
  Filled 2021-02-04: qty 60, fill #0
  Filled 2021-02-25: qty 60, 30d supply, fill #0

## 2021-02-04 MED ORDER — HEPARIN SOD (PORK) LOCK FLUSH 100 UNIT/ML IV SOLN
500.0000 [IU] | Freq: Once | INTRAVENOUS | Status: AC
  Administered 2021-02-04: 500 [IU]

## 2021-02-04 NOTE — Progress Notes (Signed)
[place]   [phone] [phone]   Clinic Follow up Note   Patient Care Team: [name], MD as PCP - General (Family Medicine) [name], [name], MD as PCP - Cardiology (Cardiology) [name], [name], MD as Consulting Physician (Cardiology) [name], MD as Consulting Physician (Obstetrics and Gynecology) [name], [name], NP as Nurse Practitioner (Hematology and Oncology) [facility] [name], MD as Consulting Physician (Hematology) [name], [name], MD as Consulting Physician (Gastroenterology) Virgina Evener, Dawn, RN (Inactive) as Oncology Nurse Navigator [name], MD as Consulting Physician (General Surgery)  Date of Service:  02/04/2021  CHIEF COMPLAINT: f/u of cholangiocarcinoma  PREVIOUS THERAPY:  1. Maintenance single agent Gemcitabine q2weeks starting on 06/28/19 with C16. On chemo break since 12/27/19. She restarted Single Agent Gemcitabine q2weeks on 04/03/20 due to mild disease progression. 2. Adding imfinzi due to mild progression on 09/26/20 PET scan, every 4 weeks, stopped after 12/25/2020  CURRENT THERAPY Pending liver targeted therapy Y90  ASSESSMENT & PLAN:  Donna May is a 80 y.o. female with   1. Intrahepatic cholangiocarcinoma, cT1N0M1, with peritoneal metastasis, MSS, IDH1 mutation (+) -Diagnosed in 09/2018 biopsy of her liver mass shows adenocarcinoma, most consistent with cholangiocarcinoma. 09/29/18 PET scan showed no evidence of distant metastasis. -Unfortunately she was found to have peritoneal metastasis to right diaphragm and surgery was aborted. -Her CT AP from 11/29/18 shows dominate liver mass stable to mild increase, mild nodularity of both adrenal glands which warrants being monitored.  No visible peritoneal metastasis on CT scan. -FO results showed MSI stable disease, IDH mutation positive.  Consider IDH 1 inhibitor in the future.   -She began first line oxaliplatin  (due to CKD) and gemcitabine every 2 weeks on 11/30/2018. Oxali was dose reduced and eventually stopped after cycle 11. She received single agent gemcitabine with cycle 12. Cisplatin added with C13 on 05/17/19 but neuropathy worsened and stopped on 06/14/19. She received gemcitabine every 2 weeks starting from 06/28/19 C16. She took chemo break from 10/21 - 1/22 -PET from 03/14/20 showed concern for disease progression in liver, she restarted maintenance gemcitabine every 2 weeks from 04/03/20. -I added durvalumab to gemcitabine, due to recurrent disease on PET 09/2020. She tolerated well overall, except persistent neuropathy  -restaging PET on 01/02/21 showed further progression of cancer in liver. I stopped gemcitabine  -she met with IR on 01/30/21 to discuss possible Y90. She was felt to be a good candidate. She will need clearance from her cardiologist and nephrologist before procedure. I will send a note to them. -I will order Ivosidenib while she is waiting for Y90    2.  Grade 3 CIPN  -secondary to Oxaliplatin, eventually dc'd after cycle 11.  Her diabetes may contribute to it also. -started cisplatin on 05/17/19, neuropathy worsened in her feet with unsteady gait and functional limitations, decreased vibratory sense on tuning fork exam 05/31/19. Cisplatin was dose reduced then eventually stopped on 06/14/19.  -low efficacy of gabapentin and cymbalta, also tried lyrica briefly.  She completed PT for balance. Ambulates with cane to avoid fall -She has functional difficulties and ambulates with a cane -Follow-up open with Dr. Mickeal Skinner, pt did not find helpful. Also seen by chiropractor   3. H/o right breast cancer, Genetics negative  -s/p mastectomy in 1992, per patient did not require adjuvant therapy -previously followed by Dr. Jana Hakim  -VUS of gene APC; genetics otherwise normal   4. Comorbidities: DM, HTN, hiatal hernia, GERD, renal artery stenosis, CHF -  Follow-up with PCP, nephrology, and Dr.  Dwyane Dee, and cardiology -stable, well controlled   5. Family support -she has a son and daughter who are supportive. -patient remains independent    6. Goal of care discussion -We frequently discuss the goal of her treatment is palliative and that her cancer is not curable with chemo alone at this stage. The goal is to control her disease, prolong her life, and give her good quality of life. She understands -she is full code now    7. CKD Stage III -continue monitoring, Scr 1.3 - 1.6     PLAN: -per pt preference, lab, flush, and f/u in 3 weeks on Wednesday (02/26/21) -I will order IDH1 inhibitor Ivosidenib 500mg  daily, may start her on it while she is waiting for Y90    No problem-specific Assessment & Plan notes found for this encounter.   SUMMARY OF ONCOLOGIC HISTORY: Oncology History Overview Note  Cancer Staging Intrahepatic cholangiocarcinoma ([place]) Staging form: Intrahepatic Bile Duct, AJCC 8th Edition - Clinical stage from 09/23/2018: Stage IB (cT1b, cN0, cM0) - Signed by [name], MD on 10/06/2018 Histologic grade (G): G3 Histologic grading system: 3 grade system    Intrahepatic cholangiocarcinoma ([place])  09/07/2018 Imaging   CT Chest IMPRESSION: 1. New, enhancing mass involving segment 4 of the liver and fundus of gallbladder is concerning for malignancy. This may represent either metastatic disease from breast cancer or neoplasm primary to the liver or hepatic biliary tree. Further evaluation with contrast enhanced CT of the abdomen and pelvis is recommended. 2. No findings to suggest metastatic disease within the chest. 3.  Aortic Atherosclerosis (ICD10-I70.0). 4. Coronary artery calcifications.   09/13/2018 Pathology Results   Diagnosis Liver, needle/core biopsy - ADENOCARCINOMA. Microscopic Comment Immunohistochemistry for CK7 is positive. CK20, TTF1, CDX-2, GATA-3, PAX 8, Qualitative ER, p63 and CK5/6 are negative. The provided clinical history of remote mammary  carcinoma is noted. Based on the morphology and immunophenotype of the adenocarcinoma observed in this specimen, primary cholangiocarcinoma is favored. Clinical and radiologic correlation are  encouraged. Results reported to [employer] on 09/15/2018. Intradepartmental consultation (Dr. Vic Ripper).   09/13/2018 Initial Diagnosis   Cholangiocarcinoma ([place])   09/23/2018 Procedure   Colonoscopy by Dr. Havery Moros 09/23/18  IMPRESSION - Two 3 to 4 mm polyps in the ascending colon, removed with a cold snare. Resected and retrieved. - Five 3 to 5 mm polyps in the transverse colon, removed with a cold snare. Resected and retrieved. - One 5 mm polyp at the splenic flexure, removed with a cold snare. Resected and retrieved. - Three 3 to 5 mm polyps in the sigmoid colon, removed with a cold snare. Resected and retrieved. - The examination was otherwise normal. Upper Endopscy by Dr. Havery Moros 09/23/18  IMPRESSION - Esophagogastric landmarks identified. - 2 cm hiatal hernia. - Normal esophagus otherwise. - A single gastric polyp. Resected and retrieved. - Mild gastritis. Biopsied. - Normal duodenal bulb and second portion of the duodenum.   09/23/2018 Pathology Results   Diagnosis 09/23/18 1. Surgical [P], duodenum - BENIGN SMALL BOWEL MUCOSA. - NO ACTIVE INFLAMMATION OR VILLOUS ATROPHY IDENTIFIED. 2. Surgical [P], stomach, polyp - HYPERPLASTIC POLYP(S). - THERE IS NO EVIDENCE OF MALIGNANCY. 3. Surgical [P], gastric antrum and gastric body - CHRONIC INACTIVE GASTRITIS. - THERE IS NO EVIDENCE OF HELICOBACTER-PYLORI, DYSPLASIA, OR MALIGNANCY. - SEE COMMENT. 4. Surgical [P], colon, sigmoid, splenic flexure, transverse and ascending, polyp (9) - TUBULAR ADENOMA(S). - SESSILE SERRATED POLYP WITHOUT CYTOLOGIC DYSPLASIA. - HIGH GRADE DYSPLASIA IS  NOT IDENTIFIED. 5. Surgical [P], colon, sigmoid, polyp (2) - HYPERPLASTIC POLYP(S). - THERE IS NO EVIDENCE OF MALIGNANCY.   09/23/2018 Cancer Staging    Staging form: Intrahepatic Bile Duct, AJCC 8th Edition - Clinical stage from 09/23/2018: Stage IB (cT1b, cN0, cM0) - Signed by [name], MD on 10/06/2018    09/29/2018 PET scan   PET 09/29/18 IMPRESSION: 1. Hypermetabolic mass in the RIGHT hepatic lobe consistent with biopsy proven adenocarcinoma. No additional liver metastasis. 2. No evidence of local breast cancer recurrence in the RIGHT breast or RIGHT axilla. 3. Mild bilateral hypermetabolic adrenal glands is favored benign hyperplasia. 4. No evidence of additional metastatic disease on skull base to thigh FDG PET scan.   11/06/2018 Genetic Testing   Negative genetic testing on the [employer] Common Hereditary Cancers panel. A variant of uncertain significance was identified in one of her APC genes, called c.1243G>A (p.Ala415Thr).  The Common Hereditary Cancers Panel offered by [employer] includes sequencing and/or deletion duplication testing of the following 48 genes: APC, ATM, AXIN2, BARD1, BMPR1A, BRCA1, BRCA2, BRIP1, CDH1, CDK4, CDKN2A (p14ARF), CDKN2A (p16INK4a), CHEK2, CTNNA1, DICER1, EPCAM (Deletion/duplication testing only), GREM1 (promoter region deletion/duplication testing only), KIT, MEN1, MLH1, MSH2, MSH3, MSH6, MUTYH, NBN, NF1, NHTL1, PALB2, PDGFRA, PMS2, POLD1, POLE, PTEN, RAD50, RAD51C, RAD51D, RNF43, SDHB, SDHC, SDHD, SMAD4, SMARCA4. STK11, TP53, TSC1, TSC2, and VHL.  The following genes were evaluated for sequence changes only: SDHA and HOXB13 c.251G>A variant only.    11/17/2018 Pathology Results   Diagnosis 11/17/18 1. Soft tissue, biopsy, Diaphragmatic nodules - METASTATIC ADENOCARCINOMA, CONSISTENT WITH PATIENT'S CLINICAL HISTORY OF CHOLANGIOCARCINOMA. SEE NOTE 2. Liver, biopsy, Left - LIVER PARENCHYMA WITH A BENIGN FIBROTIC NODULE - NO EVIDENCE OF MALIGNANCY 3. Stomach, biopsy - BENIGN PAPILLARY MESOTHELIAL HYPERPLASIA - NO EVIDENCE OF MALIGNANCY   11/29/2018 Imaging   CT CAP WO Contrast  IMPRESSION: 1. Dominant  liver mass appears grossly stable from 09/29/2018. Additional liver lesions are too small to characterize but were not shown to be hypermetabolic on PET. 2. Mild nodularity of both adrenal glands with associated hypermetabolism on 09/29/2018. Continued attention on follow-up exams is warranted. 3. Small right lower lobe nodules, stable from 09/07/2018. Again, attention on follow-up is recommended. 4. Trace bilateral pleural fluid. 5. Aortic atherosclerosis (ICD10-170.0). Coronary artery calcification. 6. Enlarged pulmonic trunk, indicative of pulmonary arterial hypertension.     11/30/2018 - 06/14/2019 Chemotherapy   First line chemo Oxaliplatin and gemcitabine q2weeks starting 11/30/18. Stopped Oxaliplatin on 05/03/19 due to worsening neuropathy. Added Cisplatin with C13 on 05/17/19 and stopped on C15 06/14/19 due to neuropathy. Reduced to maintenance single agent Gemcitabine on 06/28/19.    01/20/2019 Imaging   CT CAP IMPRESSION: restaging  1. Mild interval increase in size of dominant liver mass involving segment 4 and segment 5. 2. No significant or progressive adrenal nodularity identified to suggest metastatic disease. 3. Unchanged appearance of small right lower lobe and lingular lung nodules, nonspecific.   03/21/2019 PET scan   IMPRESSION: 1. Large right hepatic mass with SUV uptake near background hepatic activity, dramatic response to therapy, also with decrease in size when compared to the prior study. 2. Signs of prior right mastectomy and axillary dissection as before. 3. Left adrenal activity in no longer above the level of activity seen in the contralateral, right adrenal gland or in the liver. Attention on follow-up. 4. No new signs of disease.   06/27/2019 PET scan   IMPRESSION: 1. Further decrease in size of a dominant hepatic mass which is  non FDG avid. 2. No evidence of metastatic disease. 3. No evidence of left adrenal hypermetabolism or mass. 4. Incidental  findings, including: Uterine fibroids. Coronary artery atherosclerosis. Aortic Atherosclerosis (ICD10-I70.0). Pulmonary artery enlargement suggests pulmonary arterial hypertension.   06/28/2019 -  Chemotherapy   Maintenance single agent Gemcitabine q2weeks starting on 06/28/19 with C16.  ------On chemo break since 12/27/19.  ------She restarted Single Agent Gemcitabine q2weeks on 04/03/20 due to mild disease progression in liver on 02/2020 PET    09/15/2019 PET scan   IMPRESSION: 1. In the region of regional tumor at the junction of the right and left hepatic lobe, there is continued hypodensity and overall activity slightly less than that of the surrounding normal liver, indicating effective response to therapy. No current worrisome hypermetabolic lesion is identified in the liver or elsewhere. 2. Chronic asymmetric edema in the subcutaneous tissues of the right upper extremity, nonspecific. The patient has had a prior right mastectomy and right axillary dissection. 3. Other imaging findings of potential clinical significance: Aortic Atherosclerosis (ICD10-I70.0). Coronary atherosclerosis. Mild cardiomegaly. Small right and trace left pleural effusions. Subcutaneous and mild mesenteric edema. Suspected uterine fibroids.   12/18/2019 PET scan   IMPRESSION: 1. No significant abnormal activity to suggest active/recurrent malignancy. 2. Lingual tonsillar activity is mildly increased from prior but probably incidental/physiologic, attention on follow up suggested. 3. Other imaging findings of potential clinical significance: Third spacing of fluid with trace ascites, mesenteric edema, subcutaneous edema, and possibly subtle pulmonary edema. Mild cardiomegaly. Aortic Atherosclerosis (ICD10-I70.0). Coronary atherosclerosis. Right ovarian dermoid. Calcified uterine fibroids. Renal cysts. Right glenohumeral arthropathy. Mild to moderate scoliosis. Suspected pulmonary arterial hypertension.    03/14/2020 PET scan   IMPRESSION: 1. New small focus of hypermetabolism in segment 4A of the liver worrisome for new metastatic focus. 2. No other findings for metastatic disease involving the neck, chest, abdomen or pelvis.     06/11/2020 PET scan   IMPRESSION: 1. No suspicious hypermetabolic activity within the neck, chest, abdomen or pelvis. 2. The treated dominant peripheral cholangiocarcinoma appears slightly enlarged compared with recent prior studies, and there is a questionable new lesion more superiorly in the left lobe. Although without associated hypermetabolic activity, these lesions are suspicious and would be better assessed with abdominal MRI without and with contrast.   09/17/2020 PET scan   IMPRESSION: 1. Recurrent hypermetabolic activity around the periphery of the dominant hepatic mass consistent with local recurrence. This mass appears slightly larger. Possible small metastasis in the dome of the left hepatic lobe is unchanged in size, without hypermetabolic activity. 2. No distant metastases identified. 3. Stable incidental findings as detailed above.   10/16/2020 -  Chemotherapy   Patient is on Treatment Plan : BLADDER Durvalumab q28d     01/02/2021 PET scan   IMPRESSION: 1. Further enlargement of hypermetabolic hepatic mass superior to the cholecystectomy bed consistent with progressive local tumor recurrence. Satellite lesion more superiorly in the anterior dome of the liver has also enlarged, without definite hypermetabolic activity. 2. No distant metastases identified. 3. Stable incidental findings including aortic atherosclerosis, hepatic and renal cysts and uterine fibroids.      INTERVAL HISTORY:  Donna May is here for a follow up of cholangiocarcinoma. She was last seen by me on 01/08/21. She presents to the clinic alone. She reports doing okay overall. She notes her neuropathy worsens with the cold weather.   All other systems were reviewed  with the patient and are negative.  MEDICAL HISTORY:  Past Medical History:  Diagnosis Date   Anemia    Anxiety    Arthritis    Breast cancer ([place]) 1992   right   Cataract    Bilateral eyes - surgery to remove   Chronic diastolic CHF (congestive heart failure) (HCC)    Chronic kidney disease    CKD stage 3   Complication of anesthesia    "something they use make me itch for a couple of days."   Depression    Diabetes mellitus    type 2   Duodenitis 01/18/2002   Fainting spell    GERD (gastroesophageal reflux disease)    Heart murmur    never has caused any problems   Hiatal hernia 08/08/2008, 01/18/2002   History of pneumonia    x 2   Hyperlipidemia    Hypertension    Liver cancer ([place]) 08/2018   chemotherapy   Lymphedema 2017   Right arm   Peripheral neuropathy     SURGICAL HISTORY: Past Surgical History:  Procedure Laterality Date   AXILLARY SURGERY     cyst removal, right   COLONOSCOPY  09/23/2018   Dr. Havery Moros - polyps   EYE SURGERY Bilateral    cataracts to remove   IR RADIOLOGIST EVAL & MGMT  01/30/2021   LAPAROSCOPY N/A 11/17/2018   Procedure: LAPAROSCOPY DIAGNOSTIC, INTRAOPERATIVE ULTRASOUND, PERITONEAL BIOPSIES;  Surgeon: [name], MD;  Location: [facility];  Service: General;  Laterality: N/A;  GENERAL AND EPIDURAL   LIVER BIOPSY  08/2018   Dr. Lindwood Coke   MASTECTOMY  1992   right, with flap   NM [place]  06/11/2009   Protocol:[place], post stress EF58%, EKG negative for ischemia, low risk   PORTACATH PLACEMENT N/A 12/02/2018   Procedure: INSERTION PORT-A-CATH;  Surgeon: [name], MD;  Location: [facility];  Service: General;  Laterality: N/A;   RECONSTRUCTION BREAST W/ TRAM FLAP Right    TONSILLECTOMY  1958   TRANSTHORACIC ECHOCARDIOGRAM  12/24/2009   LVEF =>55%, normal study   UPPER GI ENDOSCOPY      I have reviewed the social history and family history with the patient and they are unchanged from previous note.  ALLERGIES:  has  No Known Allergies.  MEDICATIONS:  Current Outpatient Medications  Medication Sig Dispense Refill   amLODipine (NORVASC) 10 MG tablet Take 1 tablet (10 mg total) by mouth daily. 90 tablet 3   aspirin 81 MG tablet Take 81 mg by mouth daily.      Baclofen 5 MG TABS TAKE 1/2 TABLET BY MOUTH ONCE DAILY AS NEEDED 42 tablet 1   carvedilol (COREG) 25 MG tablet Take 1 tablet (25 mg total) by mouth 2 (two) times daily with a meal. 180 tablet 3   furosemide (LASIX) 40 MG tablet Take 1 tablet (40 mg total) by mouth daily. 30 tablet 11   gabapentin (NEURONTIN) 100 MG capsule Take 1 capsule (100 mg total) by mouth every 8 (eight) hours as needed. 180 capsule 2   glucose blood test strip 1 each by Other route 2 (two) times daily. Use Onetouch verio test strips as instructed to check blood sugar twice daily. 100 each 2   hydrALAZINE (APRESOLINE) 100 MG tablet TAKE THREE TIMES DAILY-AM, MIDDLE OF THE DAY AND IN THE EVENING. 270 tablet 3   KLOR-CON M10 10 MEQ tablet TAKE 1 TABLET BY MOUTH EVERY DAY 90 tablet 1   lansoprazole (PREVACID) 15 MG capsule Take 1 capsule (15 mg total) by mouth daily at 12 noon. Take  1 capsule by mouth once or twice a day as needed 90 capsule 3   lidocaine-prilocaine (EMLA) cream Apply 1 application topically as needed. 30 g 1   loratadine (CLARITIN) 10 MG tablet Take 10 mg by mouth daily as needed for allergies.     Omega 3 1000 MG CAPS Take 2,000 mg by mouth daily.      ondansetron (ZOFRAN-ODT) 4 MG disintegrating tablet Take 1 tablet (4 mg total) by mouth every 8 (eight) hours as needed for nausea or vomiting. 30 tablet 1   polyethylene glycol (MIRALAX / GLYCOLAX) 17 g packet Take 17 g by mouth daily as needed. 14 each 0   sitaGLIPtin (JANUVIA) 100 MG tablet Take 1 tablet (100 mg total) by mouth daily. 90 tablet 3   valsartan (DIOVAN) 320 MG tablet Take 1 tablet (320 mg total) by mouth daily. 90 tablet 3   Current Facility-Administered Medications  Medication Dose Route Frequency  Provider Last Rate Last Admin   triamcinolone acetonide (KENALOG) 10 MG/ML injection 10 mg  10 mg Other Once Harriet Masson, DPM        PHYSICAL EXAMINATION: ECOG PERFORMANCE STATUS: 2 - Symptomatic, <50% confined to bed  Vitals:   02/04/21 1000  BP: (!) 158/62  Pulse: 62  Resp: 18  Temp: 98.1 F (36.7 C)  SpO2: 100%   Wt Readings from Last 3 Encounters:  02/04/21 130 lb 8 oz (59.2 kg)  01/08/21 132 lb 9.6 oz (60.1 kg)  12/25/20 128 lb (58.1 kg)     GENERAL:alert, no distress and comfortable SKIN: skin color normal, no rashes or significant lesions EYES: normal, Conjunctiva are pink and non-injected, sclera clear  NEURO: alert & oriented x 3 with fluent speech  LABORATORY DATA:  I have reviewed the data as listed CBC Latest Ref Rng & Units 02/04/2021 01/08/2021 12/25/2020  WBC 4.0 - 10.5 K/uL 5.4 5.6 5.1  Hemoglobin 12.0 - 15.0 g/dL 10.3(L) 9.5(L) 9.4(L)  Hematocrit 36.0 - 46.0 % 32.9(L) 29.4(L) 29.7(L)  Platelets 150 - 400 K/uL 247 196 182     CMP Latest Ref Rng & Units 02/04/2021 01/08/2021 12/25/2020  Glucose 70 - 99 mg/dL 106(H) 102(H) 107(H)  BUN 8 - 23 mg/dL 35(H) 38(H) 27(H)  Creatinine 0.44 - 1.00 mg/dL 1.53(H) 1.55(H) 1.30(H)  Sodium 135 - 145 mmol/L 141 143 144  Potassium 3.5 - 5.1 mmol/L 4.4 4.5 4.0  Chloride 98 - 111 mmol/L 109 110 111  CO2 22 - 32 mmol/L $RemoveB'26 23 27  'QnzUWRRO$ Calcium 8.9 - 10.3 mg/dL 9.0 9.1 9.4  Total Protein 6.5 - 8.1 g/dL 7.1 7.0 6.9  Total Bilirubin 0.3 - 1.2 mg/dL <0.2(L) 0.3 0.3  Alkaline Phos 38 - 126 U/L 74 67 52  AST 15 - 41 U/L $Remo'17 16 21  'bWAsL$ ALT 0 - 44 U/L $Remo'9 7 10      'bOWqH$ RADIOGRAPHIC STUDIES: I have personally reviewed the radiological images as listed and agreed with the findings in the report. No results found.    No orders of the defined types were placed in this encounter.  All questions were answered. The patient knows to call the clinic with any problems, questions or concerns. No barriers to learning was detected. The total  time spent in the appointment was 30 minutes.     [name], MD 02/04/2021   I, Wilburn Mylar, am acting as scribe for [name], MD.   I have reviewed the above documentation for accuracy and completeness, and I agree with the above.

## 2021-02-05 ENCOUNTER — Other Ambulatory Visit (HOSPITAL_COMMUNITY): Payer: Self-pay

## 2021-02-05 ENCOUNTER — Telehealth: Payer: Self-pay | Admitting: Pharmacy Technician

## 2021-02-05 ENCOUNTER — Telehealth: Payer: Self-pay | Admitting: Pharmacist

## 2021-02-05 ENCOUNTER — Encounter: Payer: Self-pay | Admitting: Hematology

## 2021-02-05 ENCOUNTER — Encounter: Payer: Self-pay | Admitting: Oncology

## 2021-02-05 NOTE — Telephone Encounter (Signed)
Oral Oncology Patient Advocate Encounter   Received notification from [facility] FEP that prior authorization for Tibsovo is required.   PA submitted on CoverMyMeds Key [id number]  Status is pending   Oral Oncology Clinic will continue to follow.  [place] Patient [place] Phone [phone] Fax [phone] 02/05/2021 8:54 AM

## 2021-02-05 NOTE — Telephone Encounter (Signed)
Oral Oncology Pharmacist Encounter  Received new prescription for Tibsovo (ivosidenib) for the treatment of IDH1 mutated metastatic cholangiocarcinoma, planned duration until disease progression or unacceptable drug toxicity.  CBC w/ Diff and CMP from 02/04/21 assessed, noted Scr 1.53 mg/dL (CrCl ~34 mL/min) - no dose adjustments recommended in package insert. Prescription dose and frequency assessed.  Per package insert - it is recommended to obtain a baseline EKG prior to treatment initiation, and then monitor EKGs at least once weekly for the first 3 weeks of treatment and then monthly there after for duration of therapy. Will notify Dr. Burr Medico.  Current medication list in Epic reviewed, DDIs with Tibsovo identified: Category D DDI between Tibsovo and ondansetron due to risk of Qtc prolongation. Would recommend switching patient to alternative antiemetic if needed while on Tibsovo. Will confirm if patient is still taking ondansetron and update medication list if not.    Evaluated chart and no patient barriers to medication adherence noted.   Patient agreement for treatment documented in MD note on 02/04/21.  Prescription has been e-scribed to the [facility] for benefits analysis and approval.  Oral Oncology Clinic will continue to follow for insurance authorization, copayment issues, initial counseling and start date.  Leron Croak, PharmD, BCPS Hematology/Oncology Clinical Pharmacist [facility] and [place] [phone] 02/05/2021 8:36 AM

## 2021-02-05 NOTE — Telephone Encounter (Signed)
Oral Oncology Patient Advocate Encounter  Prior Authorization for Tibsovo has been approved.    PA# [id number] Effective dates: 01/06/21 through 02/05/22  Patients co-pay is $85.00  Oral Oncology Clinic will continue to follow.   [place] Patient [place] Phone [phone] Fax [phone] 02/05/2021 8:55 AM

## 2021-02-11 ENCOUNTER — Other Ambulatory Visit: Payer: Self-pay

## 2021-02-12 ENCOUNTER — Encounter: Payer: Self-pay | Admitting: Oncology

## 2021-02-12 ENCOUNTER — Encounter: Payer: Self-pay | Admitting: Hematology

## 2021-02-17 ENCOUNTER — Other Ambulatory Visit (HOSPITAL_COMMUNITY): Payer: Self-pay

## 2021-02-17 ENCOUNTER — Other Ambulatory Visit: Payer: Self-pay

## 2021-02-17 NOTE — Progress Notes (Signed)
Pt called stating her insurance sent her a letter approving her prescription for Lvosidenib (Tibsovo) which was prescribed by Dr. Burr Medico on 02/27/2021.  Pt stated she never heard anything back from Dr. Burr Medico regarding her prescription for Tibsovo.  Informed pt that the prescription was sent to [facility].  Pt stated she normally have her prescriptions filled at [facility].  Explained to pt that since this medication is a cancer drug it has to be filled by a [facility] pharmacy.  Pt verbalized understanding and stated she would like the telephone number to [place] so she could call them to see if her prescription is ready for pickup.  Pt had no further questions or concerns at this time.

## 2021-02-20 ENCOUNTER — Encounter: Payer: Self-pay | Admitting: Hematology

## 2021-02-20 ENCOUNTER — Encounter: Payer: Self-pay | Admitting: Oncology

## 2021-02-24 ENCOUNTER — Other Ambulatory Visit: Payer: Self-pay

## 2021-02-24 DIAGNOSIS — C221 Intrahepatic bile duct carcinoma: Secondary | ICD-10-CM

## 2021-02-25 ENCOUNTER — Telehealth: Payer: Self-pay | Admitting: Pharmacy Technician

## 2021-02-25 ENCOUNTER — Inpatient Hospital Stay (HOSPITAL_BASED_OUTPATIENT_CLINIC_OR_DEPARTMENT_OTHER): Payer: Medicare Other | Admitting: Hematology

## 2021-02-25 ENCOUNTER — Other Ambulatory Visit: Payer: Self-pay | Admitting: Interventional Radiology

## 2021-02-25 ENCOUNTER — Other Ambulatory Visit (HOSPITAL_COMMUNITY): Payer: Self-pay

## 2021-02-25 ENCOUNTER — Encounter: Payer: Self-pay | Admitting: Hematology

## 2021-02-25 ENCOUNTER — Other Ambulatory Visit: Payer: Self-pay

## 2021-02-25 ENCOUNTER — Encounter: Payer: Self-pay | Admitting: Oncology

## 2021-02-25 ENCOUNTER — Inpatient Hospital Stay: Payer: Medicare Other | Attending: Adult Health

## 2021-02-25 VITALS — BP 146/57 | HR 67 | Temp 98.8°F | Resp 17 | Ht 64.0 in | Wt 131.4 lb

## 2021-02-25 DIAGNOSIS — C221 Intrahepatic bile duct carcinoma: Secondary | ICD-10-CM | POA: Diagnosis not present

## 2021-02-25 DIAGNOSIS — E1122 Type 2 diabetes mellitus with diabetic chronic kidney disease: Secondary | ICD-10-CM | POA: Diagnosis not present

## 2021-02-25 DIAGNOSIS — Z853 Personal history of malignant neoplasm of breast: Secondary | ICD-10-CM | POA: Insufficient documentation

## 2021-02-25 DIAGNOSIS — I5032 Chronic diastolic (congestive) heart failure: Secondary | ICD-10-CM | POA: Insufficient documentation

## 2021-02-25 DIAGNOSIS — I130 Hypertensive heart and chronic kidney disease with heart failure and stage 1 through stage 4 chronic kidney disease, or unspecified chronic kidney disease: Secondary | ICD-10-CM | POA: Insufficient documentation

## 2021-02-25 DIAGNOSIS — T451X5D Adverse effect of antineoplastic and immunosuppressive drugs, subsequent encounter: Secondary | ICD-10-CM | POA: Insufficient documentation

## 2021-02-25 DIAGNOSIS — T451X5A Adverse effect of antineoplastic and immunosuppressive drugs, initial encounter: Secondary | ICD-10-CM

## 2021-02-25 DIAGNOSIS — G620 Drug-induced polyneuropathy: Secondary | ICD-10-CM

## 2021-02-25 DIAGNOSIS — K449 Diaphragmatic hernia without obstruction or gangrene: Secondary | ICD-10-CM | POA: Insufficient documentation

## 2021-02-25 DIAGNOSIS — Z9011 Acquired absence of right breast and nipple: Secondary | ICD-10-CM | POA: Insufficient documentation

## 2021-02-25 DIAGNOSIS — K219 Gastro-esophageal reflux disease without esophagitis: Secondary | ICD-10-CM | POA: Insufficient documentation

## 2021-02-25 DIAGNOSIS — E1140 Type 2 diabetes mellitus with diabetic neuropathy, unspecified: Secondary | ICD-10-CM | POA: Insufficient documentation

## 2021-02-25 DIAGNOSIS — I701 Atherosclerosis of renal artery: Secondary | ICD-10-CM | POA: Diagnosis not present

## 2021-02-25 DIAGNOSIS — C786 Secondary malignant neoplasm of retroperitoneum and peritoneum: Secondary | ICD-10-CM | POA: Diagnosis not present

## 2021-02-25 DIAGNOSIS — N183 Chronic kidney disease, stage 3 unspecified: Secondary | ICD-10-CM | POA: Insufficient documentation

## 2021-02-25 DIAGNOSIS — Z95828 Presence of other vascular implants and grafts: Secondary | ICD-10-CM

## 2021-02-25 LAB — CBC WITH DIFFERENTIAL (CANCER CENTER ONLY)
Abs Immature Granulocytes: 0.03 10*3/uL (ref 0.00–0.07)
Basophils Absolute: 0 10*3/uL (ref 0.0–0.1)
Basophils Relative: 0 %
Eosinophils Absolute: 0.3 10*3/uL (ref 0.0–0.5)
Eosinophils Relative: 5 %
HCT: 35.5 % — ABNORMAL LOW (ref 36.0–46.0)
Hemoglobin: 11.4 g/dL — ABNORMAL LOW (ref 12.0–15.0)
Immature Granulocytes: 0 %
Lymphocytes Relative: 21 %
Lymphs Abs: 1.4 10*3/uL (ref 0.7–4.0)
MCH: 30.7 pg (ref 26.0–34.0)
MCHC: 32.1 g/dL (ref 30.0–36.0)
MCV: 95.7 fL (ref 80.0–100.0)
Monocytes Absolute: 0.7 10*3/uL (ref 0.1–1.0)
Monocytes Relative: 11 %
Neutro Abs: 4.3 10*3/uL (ref 1.7–7.7)
Neutrophils Relative %: 63 %
Platelet Count: 208 10*3/uL (ref 150–400)
RBC: 3.71 MIL/uL — ABNORMAL LOW (ref 3.87–5.11)
RDW: 15 % (ref 11.5–15.5)
WBC Count: 6.9 10*3/uL (ref 4.0–10.5)
nRBC: 0 % (ref 0.0–0.2)

## 2021-02-25 LAB — CMP (CANCER CENTER ONLY)
ALT: 13 U/L (ref 0–44)
AST: 21 U/L (ref 15–41)
Albumin: 3.4 g/dL — ABNORMAL LOW (ref 3.5–5.0)
Alkaline Phosphatase: 78 U/L (ref 38–126)
Anion gap: 7 (ref 5–15)
BUN: 32 mg/dL — ABNORMAL HIGH (ref 8–23)
CO2: 26 mmol/L (ref 22–32)
Calcium: 8.8 mg/dL — ABNORMAL LOW (ref 8.9–10.3)
Chloride: 106 mmol/L (ref 98–111)
Creatinine: 1.3 mg/dL — ABNORMAL HIGH (ref 0.44–1.00)
GFR, Estimated: 41 mL/min — ABNORMAL LOW (ref >60)
Glucose, Bld: 76 mg/dL (ref 70–99)
Potassium: 4.2 mmol/L (ref 3.5–5.1)
Sodium: 139 mmol/L (ref 135–145)
Total Bilirubin: 0.3 mg/dL (ref 0.3–1.2)
Total Protein: 7 g/dL (ref 6.5–8.1)

## 2021-02-25 MED ORDER — HEPARIN SOD (PORK) LOCK FLUSH 100 UNIT/ML IV SOLN
500.0000 [IU] | Freq: Once | INTRAVENOUS | Status: AC
Start: 2021-02-25 — End: 2021-02-25
  Administered 2021-02-25: 500 [IU]

## 2021-02-25 MED ORDER — SODIUM CHLORIDE 0.9% FLUSH
10.0000 mL | Freq: Once | INTRAVENOUS | Status: AC
  Administered 2021-02-25: 10 mL

## 2021-02-25 NOTE — Telephone Encounter (Signed)
Oral Oncology Patient Advocate Encounter   Was successful in securing patient a $8000 grant from [place] to provide copayment coverage for Tibsovo.  This will keep the out of pocket expense at $0.     The billing information is as follows and has been shared with [place].   Member ID: [id number]  Group ID: [id number] RxBin: [id number] PCN: [id number] Dates of Eligibility: 02/25/21 through 02/25/22  Fund name:  [place] [facility] [place] Phone [phone] Fax [phone] 02/25/2021 12:08 PM

## 2021-02-25 NOTE — Progress Notes (Signed)
[place]   Telephone:(336) [phone] Fax:(336) [phone]   Clinic Follow up Note   Patient Care Team: [name], MD as PCP - General (Family Medicine) [name], [name], MD as PCP - Cardiology (Cardiology) [name], [name], MD as Consulting Physician (Cardiology) [name], MD as Consulting Physician (Obstetrics and Gynecology) [name], [name], NP as Nurse Practitioner (Hematology and Oncology) [facility] [name], MD as Consulting Physician (Hematology) [name], [name], MD as Consulting Physician (Gastroenterology) Virgina Evener, Dawn, RN (Inactive) as Oncology Nurse Navigator [name], MD as Consulting Physician (General Surgery)  Date of Service:  02/25/2021  CHIEF COMPLAINT: f/u of cholangiocarcinoma  PREVIOUS THERAPY:  1. Maintenance single agent Gemcitabine q2weeks starting on 06/28/19 with C16. On chemo break since 12/27/19. She restarted Single Agent Gemcitabine q2weeks on 04/03/20 due to mild disease progression. 2. Adding imfinzi due to mild progression on 09/26/20 PET scan, every 4 weeks, stopped after 12/25/2020   CURRENT THERAPY Pending liver targeted therapy Y90  ASSESSMENT & PLAN:  Donna May is a 80 y.o. female with   1. Intrahepatic cholangiocarcinoma, cT1N0M1, with peritoneal metastasis, MSS, IDH1 mutation (+) -Diagnosed in 09/2018 biopsy of her liver mass shows adenocarcinoma, most consistent with cholangiocarcinoma. 09/29/18 PET scan showed no evidence of distant metastasis. -Unfortunately she was found to have peritoneal metastasis to right diaphragm and surgery was aborted. -Her CT AP from 11/29/18 shows dominate liver mass stable to mild increase, mild nodularity of both adrenal glands which warrants being monitored.  No visible peritoneal metastasis on CT scan. -FO results showed MSI stable disease, IDH mutation positive.  Consider IDH 1 inhibitor in the future.   -She began first line oxaliplatin  (due to CKD) and gemcitabine every 2 weeks on 11/30/2018. Oxali was dose reduced and eventually stopped after cycle 11. She received single agent gemcitabine with cycle 12. Cisplatin added with C13 on 05/17/19 but neuropathy worsened and stopped on 06/14/19. She received gemcitabine every 2 weeks starting from 06/28/19 C16. She took chemo break from 10/21 - 1/22 -PET from 03/14/20 showed concern for disease progression in liver, she restarted maintenance gemcitabine every 2 weeks from 04/03/20. -I added durvalumab to gemcitabine, due to recurrent disease on PET 09/2020. She tolerated well overall, except persistent neuropathy  -restaging PET on 01/02/21 showed further progression of cancer in liver. I stopped gemcitabine  -she met with IR on 01/30/21 to discuss possible Y90. She was felt to be a good candidate. She will need clearance from her cardiologist and nephrologist before procedure. She is awaiting cardiac clearance and then planning scans.I did talk to her nephrologist Dr. Joelyn Oms and he cleared her fopr procedure  -since her Y90 may take a few months to complete, I recommend Ivosidenib for her to take while she is waiting for Y90. I explained the indications and side effects of the medicine, especially fatigue, GI side effects, cytopenia, and EKG changes. Will get baseline EKG today and monitor every week for next 3 weeks. She agrees to pick up from [facility] today and start today or tomorrow  -She also asked questions about the Y90 procedure, which I explained is why she met with Dr. Pascal Lux.    2.  Grade 3 CIPN  -secondary to Oxaliplatin, eventually dc'd after cycle 11.  Her diabetes may contribute to it also. -started cisplatin on 05/17/19, neuropathy worsened in her feet with unsteady gait and functional limitations, decreased vibratory sense on tuning fork exam 05/31/19. Cisplatin was dose reduced then eventually  stopped on 06/14/19.  -low efficacy of gabapentin and cymbalta, also tried lyrica  briefly.  She completed PT for balance. Ambulates with cane to avoid fall -She has functional difficulties and ambulates with a cane -Follow-up open with Dr. Mickeal Skinner, pt did not find helpful. Also seen by chiropractor   3. H/o right breast cancer, Genetics negative  -s/p mastectomy in 1992, per patient did not require adjuvant therapy -previously followed by Dr. Jana Hakim  -VUS of gene APC; genetics otherwise normal   4. Comorbidities: DM, HTN, hiatal hernia, GERD, renal artery stenosis, CHF -Follow-up with PCP, nephrology, and Dr. Dwyane Dee, and cardiology -stable, well controlled   5. Family support -she has a son and daughter who are supportive. -patient remains independent    6. Goal of care discussion -We frequently discuss the goal of her treatment is palliative and that her cancer is not curable with chemo alone at this stage. The goal is to control her disease, prolong her life, and give her good quality of life. She understands -she is full code now    7. CKD Stage III -continue monitoring, Scr 1.3 - 1.6     PLAN: -I previously called in Ivosidenib 522m daily, she will start today or tomorrow  -f/u with cardiology for clearance of Y90 -EKG weekly X3 -lab and f/u in 1 and 3 weeks    No problem-specific Assessment & Plan notes found for this encounter.   SUMMARY OF ONCOLOGIC HISTORY: Oncology History Overview Note  Cancer Staging Intrahepatic cholangiocarcinoma ([place] Staging form: Intrahepatic Bile Duct, AJCC 8th Edition - Clinical stage from 09/23/2018: Stage IB (cT1b, cN0, cM0) - Signed by FTruitt Merle MD on 10/06/2018 Histologic grade (G): G3 Histologic grading system: 3 grade system    Intrahepatic cholangiocarcinoma ([place]  09/07/2018 Imaging   CT Chest IMPRESSION: 1. New, enhancing mass involving segment 4 of the liver and fundus of gallbladder is concerning for malignancy. This may represent either metastatic disease from breast cancer or neoplasm primary to the  liver or hepatic biliary tree. Further evaluation with contrast enhanced CT of the abdomen and pelvis is recommended. 2. No findings to suggest metastatic disease within the chest. 3.  Aortic Atherosclerosis (ICD10-I70.0). 4. Coronary artery calcifications.   09/13/2018 Pathology Results   Diagnosis Liver, needle/core biopsy - ADENOCARCINOMA. Microscopic Comment Immunohistochemistry for CK7 is positive. CK20, TTF1, CDX-2, GATA-3, PAX 8, Qualitative ER, p63 and CK5/6 are negative. The provided clinical history of remote mammary carcinoma is noted. Based on the morphology and immunophenotype of the adenocarcinoma observed in this specimen, primary cholangiocarcinoma is favored. Clinical and radiologic correlation are  encouraged. Results reported to [facility] 09/15/2018. Intradepartmental consultation (Dr. KVic Ripper.   09/13/2018 Initial Diagnosis   [place]olangiocarcinoma ([place]   09/23/2018 Procedure   Colonoscopy by Dr. AHavery Moros7/10/20  IMPRESSION - Two 3 to 4 mm polyps in the ascending colon, removed with a cold snare. Resected and retrieved. - Five 3 to 5 mm polyps in the transverse colon, removed with a cold snare. Resected and retrieved. - One 5 mm polyp at the splenic flexure, removed with a cold snare. Resected and retrieved. - Three 3 to 5 mm polyps in the sigmoid colon, removed with a cold snare. Resected and retrieved. - The examination was otherwise normal. Upper Endopscy by Dr. AHavery Moros7/10/20  IMPRESSION - Esophagogastric landmarks identified. - 2 cm hiatal hernia. - Normal esophagus otherwise. - A single gastric polyp. Resected and retrieved. - Mild gastritis. Biopsied. - Normal duodenal bulb and second portion  of the duodenum.   09/23/2018 Pathology Results   Diagnosis 09/23/18 1. Surgical [P], duodenum - BENIGN SMALL BOWEL MUCOSA. - NO ACTIVE INFLAMMATION OR VILLOUS ATROPHY IDENTIFIED. 2. Surgical [P], stomach, polyp - HYPERPLASTIC POLYP(S). - THERE IS NO  EVIDENCE OF MALIGNANCY. 3. Surgical [P], gastric antrum and gastric body - CHRONIC INACTIVE GASTRITIS. - THERE IS NO EVIDENCE OF HELICOBACTER-PYLORI, DYSPLASIA, OR MALIGNANCY. - SEE COMMENT. 4. Surgical [P], colon, sigmoid, splenic flexure, transverse and ascending, polyp (9) - TUBULAR ADENOMA(S). - SESSILE SERRATED POLYP WITHOUT CYTOLOGIC DYSPLASIA. - HIGH GRADE DYSPLASIA IS NOT IDENTIFIED. 5. Surgical [P], colon, sigmoid, polyp (2) - HYPERPLASTIC POLYP(S). - THERE IS NO EVIDENCE OF MALIGNANCY.   09/23/2018 Cancer Staging   Staging form: Intrahepatic Bile Duct, AJCC 8th Edition - Clinical stage from 09/23/2018: Stage IB (cT1b, cN0, cM0) - Signed by [name], MD on 10/06/2018    09/29/2018 PET scan   PET 09/29/18 IMPRESSION: 1. Hypermetabolic mass in the RIGHT hepatic lobe consistent with biopsy proven adenocarcinoma. No additional liver metastasis. 2. No evidence of local breast cancer recurrence in the RIGHT breast or RIGHT axilla. 3. Mild bilateral hypermetabolic adrenal glands is favored benign hyperplasia. 4. No evidence of additional metastatic disease on skull base to thigh FDG PET scan.   11/06/2018 Genetic Testing   Negative genetic testing on the [facility] Common Hereditary Cancers panel. A variant of uncertain significance was identified in one of her APC genes, called c.1243G>A (p.Ala415Thr).  The Common Hereditary Cancers Panel offered by [facility] includes sequencing and/or deletion duplication testing of the following 48 genes: APC, ATM, AXIN2, BARD1, BMPR1A, BRCA1, BRCA2, BRIP1, CDH1, CDK4, CDKN2A (p14ARF), CDKN2A (p16INK4a), CHEK2, CTNNA1, DICER1, EPCAM (Deletion/duplication testing only), GREM1 (promoter region deletion/duplication testing only), KIT, MEN1, MLH1, MSH2, MSH3, MSH6, MUTYH, NBN, NF1, NHTL1, PALB2, PDGFRA, PMS2, POLD1, POLE, PTEN, RAD50, RAD51C, RAD51D, RNF43, SDHB, SDHC, SDHD, SMAD4, SMARCA4. STK11, TP53, TSC1, TSC2, and VHL.  The following genes were evaluated  for sequence changes only: SDHA and HOXB13 c.251G>A variant only.    11/17/2018 Pathology Results   Diagnosis 11/17/18 1. Soft tissue, biopsy, Diaphragmatic nodules - METASTATIC ADENOCARCINOMA, CONSISTENT WITH PATIENT'S CLINICAL HISTORY OF CHOLANGIOCARCINOMA. SEE NOTE 2. Liver, biopsy, Left - LIVER PARENCHYMA WITH A BENIGN FIBROTIC NODULE - NO EVIDENCE OF MALIGNANCY 3. Stomach, biopsy - BENIGN PAPILLARY MESOTHELIAL HYPERPLASIA - NO EVIDENCE OF MALIGNANCY   11/29/2018 Imaging   CT CAP WO Contrast  IMPRESSION: 1. Dominant liver mass appears grossly stable from 09/29/2018. Additional liver lesions are too small to characterize but were not shown to be hypermetabolic on PET. 2. Mild nodularity of both adrenal glands with associated hypermetabolism on 09/29/2018. Continued attention on follow-up exams is warranted. 3. Small right lower lobe nodules, stable from 09/07/2018. Again, attention on follow-up is recommended. 4. Trace bilateral pleural fluid. 5. Aortic atherosclerosis (ICD10-170.0). Coronary artery calcification. 6. Enlarged pulmonic trunk, indicative of pulmonary arterial hypertension.     11/30/2018 - 06/14/2019 Chemotherapy   First line chemo Oxaliplatin and gemcitabine q2weeks starting 11/30/18. Stopped Oxaliplatin on 05/03/19 due to worsening neuropathy. Added Cisplatin with C13 on 05/17/19 and stopped on C15 06/14/19 due to neuropathy. Reduced to maintenance single agent Gemcitabine on 06/28/19.    01/20/2019 Imaging   CT CAP IMPRESSION: restaging  1. Mild interval increase in size of dominant liver mass involving segment 4 and segment 5. 2. No significant or progressive adrenal nodularity identified to suggest metastatic disease. 3. Unchanged appearance of small right lower lobe and lingular lung nodules, nonspecific.  03/21/2019 PET scan   IMPRESSION: 1. Large right hepatic mass with SUV uptake near background hepatic activity, dramatic response to therapy, also with  decrease in size when compared to the prior study. 2. Signs of prior right mastectomy and axillary dissection as before. 3. Left adrenal activity in no longer above the level of activity seen in the contralateral, right adrenal gland or in the liver. Attention on follow-up. 4. No new signs of disease.   06/27/2019 PET scan   IMPRESSION: 1. Further decrease in size of a dominant hepatic mass which is non FDG avid. 2. No evidence of metastatic disease. 3. No evidence of left adrenal hypermetabolism or mass. 4. Incidental findings, including: Uterine fibroids. Coronary artery atherosclerosis. Aortic Atherosclerosis (ICD10-I70.0). Pulmonary artery enlargement suggests pulmonary arterial hypertension.   06/28/2019 -  Chemotherapy   Maintenance single agent Gemcitabine q2weeks starting on 06/28/19 with C16.  ------On chemo break since 12/27/19.  ------She restarted Single Agent Gemcitabine q2weeks on 04/03/20 due to mild disease progression in liver on 02/2020 PET    09/15/2019 PET scan   IMPRESSION: 1. In the region of regional tumor at the junction of the right and left hepatic lobe, there is continued hypodensity and overall activity slightly less than that of the surrounding normal liver, indicating effective response to therapy. No current worrisome hypermetabolic lesion is identified in the liver or elsewhere. 2. Chronic asymmetric edema in the subcutaneous tissues of the right upper extremity, nonspecific. The patient has had a prior right mastectomy and right axillary dissection. 3. Other imaging findings of potential clinical significance: Aortic Atherosclerosis (ICD10-I70.0). Coronary atherosclerosis. Mild cardiomegaly. Small right and trace left pleural effusions. Subcutaneous and mild mesenteric edema. Suspected uterine fibroids.   12/18/2019 PET scan   IMPRESSION: 1. No significant abnormal activity to suggest active/recurrent malignancy. 2. Lingual tonsillar activity is  mildly increased from prior but probably incidental/physiologic, attention on follow up suggested. 3. Other imaging findings of potential clinical significance: Third spacing of fluid with trace ascites, mesenteric edema, subcutaneous edema, and possibly subtle pulmonary edema. Mild cardiomegaly. Aortic Atherosclerosis (ICD10-I70.0). Coronary atherosclerosis. Right ovarian dermoid. Calcified uterine fibroids. Renal cysts. Right glenohumeral arthropathy. Mild to moderate scoliosis. Suspected pulmonary arterial hypertension.   03/14/2020 PET scan   IMPRESSION: 1. New small focus of hypermetabolism in segment 4A of the liver worrisome for new metastatic focus. 2. No other findings for metastatic disease involving the neck, chest, abdomen or pelvis.     06/11/2020 PET scan   IMPRESSION: 1. No suspicious hypermetabolic activity within the neck, chest, abdomen or pelvis. 2. The treated dominant peripheral cholangiocarcinoma appears slightly enlarged compared with recent prior studies, and there is a questionable new lesion more superiorly in the left lobe. Although without associated hypermetabolic activity, these lesions are suspicious and would be better assessed with abdominal MRI without and with contrast.   09/17/2020 PET scan   IMPRESSION: 1. Recurrent hypermetabolic activity around the periphery of the dominant hepatic mass consistent with local recurrence. This mass appears slightly larger. Possible small metastasis in the dome of the left hepatic lobe is unchanged in size, without hypermetabolic activity. 2. No distant metastases identified. 3. Stable incidental findings as detailed above.   10/16/2020 -  Chemotherapy   Patient is on Treatment Plan : BLADDER Durvalumab q28d     01/02/2021 PET scan   IMPRESSION: 1. Further enlargement of hypermetabolic hepatic mass superior to the cholecystectomy bed consistent with progressive local tumor recurrence. Satellite lesion more  superiorly in the anterior dome of  the liver has also enlarged, without definite hypermetabolic activity. 2. No distant metastases identified. 3. Stable incidental findings including aortic atherosclerosis, hepatic and renal cysts and uterine fibroids.      INTERVAL HISTORY:  Donna May is here for a follow up of cholangiocarcinoma. She was last seen by me on 02/04/21. She presents to the clinic alone.   All other systems were reviewed with the patient and are negative.  MEDICAL HISTORY:  Past Medical History:  Diagnosis Date   Anemia    Anxiety    Arthritis    Breast cancer ([facility]) 1992   right   Cataract    Bilateral eyes - surgery to remove   Chronic diastolic CHF (congestive heart failure) (HCC)    Chronic kidney disease    CKD stage 3   Complication of anesthesia    "something they use make me itch for a couple of days."   Depression    Diabetes mellitus    type 2   Duodenitis 01/18/2002   Fainting spell    GERD (gastroesophageal reflux disease)    Heart murmur    never has caused any problems   Hiatal hernia 08/08/2008, 01/18/2002   History of pneumonia    x 2   Hyperlipidemia    Hypertension    Liver cancer ([facility]) 08/2018   chemotherapy   Lymphedema 2017   Right arm   Peripheral neuropathy     SURGICAL HISTORY: Past Surgical History:  Procedure Laterality Date   AXILLARY SURGERY     cyst removal, right   COLONOSCOPY  09/23/2018   Dr. Havery Moros - polyps   EYE SURGERY Bilateral    cataracts to remove   IR RADIOLOGIST EVAL & MGMT  01/30/2021   LAPAROSCOPY N/A 11/17/2018   Procedure: LAPAROSCOPY DIAGNOSTIC, INTRAOPERATIVE ULTRASOUND, PERITONEAL BIOPSIES;  Surgeon: [name], MD;  Location: [facility];  Service: General;  Laterality: N/A;  GENERAL AND EPIDURAL   LIVER BIOPSY  08/2018   Dr. Lindwood Coke   MASTECTOMY  1992   right, with flap   NM [place]  06/11/2009   Protocol:[place], post stress EF58%, EKG negative for ischemia, low risk    PORTACATH PLACEMENT N/A 12/02/2018   Procedure: INSERTION PORT-A-CATH;  Surgeon: [name], MD;  Location: [facility];  Service: General;  Laterality: N/A;   RECONSTRUCTION BREAST W/ TRAM FLAP Right    TONSILLECTOMY  1958   TRANSTHORACIC ECHOCARDIOGRAM  12/24/2009   LVEF =>55%, normal study   UPPER GI ENDOSCOPY      I have reviewed the social history and family history with the patient and they are unchanged from previous note.  ALLERGIES:  has No Known Allergies.  MEDICATIONS:  Current Outpatient Medications  Medication Sig Dispense Refill   amLODipine (NORVASC) 10 MG tablet Take 1 tablet (10 mg total) by mouth daily. 90 tablet 3   aspirin 81 MG tablet Take 81 mg by mouth daily.      Baclofen 5 MG TABS TAKE 1/2 TABLET BY MOUTH ONCE DAILY AS NEEDED 42 tablet 1   carvedilol (COREG) 25 MG tablet Take 1 tablet (25 mg total) by mouth 2 (two) times daily with a meal. 180 tablet 3   furosemide (LASIX) 40 MG tablet Take 1 tablet (40 mg total) by mouth daily. 30 tablet 11   gabapentin (NEURONTIN) 100 MG capsule Take 1 capsule (100 mg total) by mouth every 8 (eight) hours as needed. 180 capsule 2   glucose blood test strip 1 each  by Other route 2 (two) times daily. Use Onetouch verio test strips as instructed to check blood sugar twice daily. 100 each 2   hydrALAZINE (APRESOLINE) 100 MG tablet TAKE THREE TIMES DAILY-AM, MIDDLE OF THE DAY AND IN THE EVENING. 270 tablet 3   [START ON 02/27/2021] Ivosidenib (TIBSOVO) 250 MG TABS Take 500 mg by mouth daily. 60 tablet 0   KLOR-CON M10 10 MEQ tablet TAKE 1 TABLET BY MOUTH EVERY DAY 90 tablet 1   lansoprazole (PREVACID) 15 MG capsule Take 1 capsule (15 mg total) by mouth daily at 12 noon. Take 1 capsule by mouth once or twice a day as needed 90 capsule 3   lidocaine-prilocaine (EMLA) cream Apply 1 application topically as needed. 30 g 1   loratadine (CLARITIN) 10 MG tablet Take 10 mg by mouth daily as needed for allergies.     Omega 3 1000 MG CAPS Take  2,000 mg by mouth daily.      ondansetron (ZOFRAN-ODT) 4 MG disintegrating tablet Take 1 tablet (4 mg total) by mouth every 8 (eight) hours as needed for nausea or vomiting. 30 tablet 1   polyethylene glycol (MIRALAX / GLYCOLAX) 17 g packet Take 17 g by mouth daily as needed. 14 each 0   sitaGLIPtin (JANUVIA) 100 MG tablet Take 1 tablet (100 mg total) by mouth daily. 90 tablet 3   valsartan (DIOVAN) 320 MG tablet Take 1 tablet (320 mg total) by mouth daily. 90 tablet 3   Current Facility-Administered Medications  Medication Dose Route Frequency Provider Last Rate Last Admin   triamcinolone acetonide (KENALOG) 10 MG/ML injection 10 mg  10 mg Other Once Harriet Masson, DPM        PHYSICAL EXAMINATION: ECOG PERFORMANCE STATUS: 2 - Symptomatic, <50% confined to bed  Vitals:   02/25/21 1006  BP: (!) 146/57  Pulse: 67  Resp: 17  Temp: 98.8 F (37.1 C)  SpO2: 97%   Wt Readings from Last 3 Encounters:  02/25/21 131 lb 6.4 oz (59.6 kg)  02/04/21 130 lb 8 oz (59.2 kg)  01/08/21 132 lb 9.6 oz (60.1 kg)     GENERAL:alert, no distress and comfortable SKIN: skin color normal, no rashes or significant lesions EYES: normal, Conjunctiva are pink and non-injected, sclera clear  NEURO: alert & oriented x 3 with fluent speech  LABORATORY DATA:  I have reviewed the data as listed CBC Latest Ref Rng & Units 02/25/2021 02/04/2021 01/08/2021  WBC 4.0 - 10.5 K/uL 6.9 5.4 5.6  Hemoglobin 12.0 - 15.0 g/dL 11.4(L) 10.3(L) 9.5(L)  Hematocrit 36.0 - 46.0 % 35.5(L) 32.9(L) 29.4(L)  Platelets 150 - 400 K/uL 208 247 196     CMP Latest Ref Rng & Units 02/25/2021 02/04/2021 01/08/2021  Glucose 70 - 99 mg/dL 76 106(H) 102(H)  BUN 8 - 23 mg/dL 32(H) 35(H) 38(H)  Creatinine 0.44 - 1.00 mg/dL 1.30(H) 1.53(H) 1.55(H)  Sodium 135 - 145 mmol/L 139 141 143  Potassium 3.5 - 5.1 mmol/L 4.2 4.4 4.5  Chloride 98 - 111 mmol/L 106 109 110  CO2 22 - 32 mmol/L _0 Calcium 8.9 - 10.3 mg/dL 8.8(L) 9.0 9.1   Total Protein 6.5 - 8.1 g/dL 7.0 7.1 7.0  Total Bilirubin 0.3 - 1.2 mg/dL 0.3 <0.2(L) 0.3  Alkaline Phos 38 - 126 U/L 78 74 67  AST 15 - 41 U/L _1 ALT 0 - 44 U/L _2 RADIOGRAPHIC STUDIES: I have personally reviewed  the radiological images as listed and agreed with the findings in the report. No results found.    No orders of the defined types were placed in this encounter.  All questions were answered. The patient knows to call the clinic with any problems, questions or concerns. No barriers to learning was detected. The total time spent in the appointment was 30 minutes.     [name], MD 02/25/2021   I, Wilburn Mylar, am acting as scribe for [name], MD.   I have reviewed the above documentation for accuracy and completeness, and I agree with the above.

## 2021-02-25 NOTE — Telephone Encounter (Signed)
Oral Chemotherapy Pharmacist Encounter  I spoke with patient for overview of new oral chemotherapy medication: Tibsovo (ivosidenib) for the treatment of IDH1 mutated metastatic cholangiocarcinoma, planned duration until disease progression or unacceptable toxicity.   Counseled patient on administration, dosing, side effects, monitoring, drug-food interactions, safe handling, storage, and disposal.  Patient will take Tibsovo 250 mg tablets, 2 tablets (500 mg) by mouth once daily, with or without food, at approximately the same time each day. Patient informed that Tibsovo should not be taken with a high fat meal due to risk of very increased absorption.  Patient knows to avoid grapefruit and grapefruit juice while on therapy with Tibsovo.  Tibsovo start date: 02/26/21  Side effects include but not limited to: GI upset (nausea, diarrhea, abdominal pain, decreased appetite, vomiting, constipation), fatigue, cough, lower extremity edema and headache.   Baseline EKG obtained 02/25/21 was WNL.   Patient has anti-emetic on hand and knows to take it if nausea develops.   Patient will obtain anti diarrheal and alert the office of 4 or more loose stools above baseline.  Reviewed with patient importance of keeping a medication schedule and plan for any missed doses. No barriers to medication adherence identified.  Medication reconciliation performed and medication/allergy list updated.  Insurance authorization for Tibsovo has been obtained. Test claim at the pharmacy revealed copayment $0 for 1st fill of Tibsovo. Patient picked this up from [facility] 02/25/21.  Subsequent fills will need to be filled through [facility] due to patient's insurance requirements.  All questions answered.  Ms. Schmelzle voiced understanding and appreciation.   Medication education handout placed in mail for patient. Patient knows to call the office with questions or concerns. Oral  Chemotherapy Clinic phone number provided to patient.   Leron Croak, PharmD, BCPS Hematology/Oncology Clinical Pharmacist [facility] [place] [phone] 02/25/2021 12:50 PM

## 2021-02-26 ENCOUNTER — Telehealth: Payer: Self-pay

## 2021-02-26 NOTE — Telephone Encounter (Signed)
Pt called wanting to confirm that she is to take 500mg  daily (2 tabs daily) of Tibsovo.  Confirmed that the pt is to take 2tab/day totaling 500mg  daily of the Tibsovo.  Pt verbalized understanding of instructions.  Pt had no further questions at this time.

## 2021-02-28 ENCOUNTER — Ambulatory Visit: Payer: Medicare Other | Admitting: Podiatry

## 2021-03-03 ENCOUNTER — Other Ambulatory Visit: Payer: Self-pay

## 2021-03-03 ENCOUNTER — Ambulatory Visit (INDEPENDENT_AMBULATORY_CARE_PROVIDER_SITE_OTHER): Payer: Medicare Other | Admitting: Podiatry

## 2021-03-03 ENCOUNTER — Encounter: Payer: Self-pay | Admitting: Podiatry

## 2021-03-03 DIAGNOSIS — E1142 Type 2 diabetes mellitus with diabetic polyneuropathy: Secondary | ICD-10-CM | POA: Diagnosis not present

## 2021-03-03 DIAGNOSIS — M79675 Pain in left toe(s): Secondary | ICD-10-CM

## 2021-03-03 DIAGNOSIS — N1832 Chronic kidney disease, stage 3b: Secondary | ICD-10-CM | POA: Diagnosis not present

## 2021-03-03 DIAGNOSIS — E1122 Type 2 diabetes mellitus with diabetic chronic kidney disease: Secondary | ICD-10-CM

## 2021-03-03 DIAGNOSIS — M79674 Pain in right toe(s): Secondary | ICD-10-CM | POA: Diagnosis not present

## 2021-03-03 DIAGNOSIS — B351 Tinea unguium: Secondary | ICD-10-CM

## 2021-03-03 NOTE — Progress Notes (Signed)
This patient returns to my office for at risk foot care.  This patient requires this care by a professional since this patient will be at risk due to having diabetes and peripheral neuropathy due to chemo.  This patient is unable to cut nails herself since the patient cannot reach hiernails.These nails are painful walking and wearing shoes.  This patient presents for at risk foot care today.  General Appearance  Alert, conversant and in no acute stress.  Vascular  Dorsalis pedis  pulses are weakly  palpable  bilaterally. Posterior tibial pulses are absent  B/L. Capillary return is within normal limits  Bilaterally.Cold feet  Bilaterally.Absent digital hair B/L.  Neurologic  Senn-Weinstein monofilament wire test diminished   bilaterally. Muscle power within normal limits bilaterally.  Nails Thick disfigured discolored nails with subungual debris  from second  to fifth toes bilaterally. No evidence of bacterial infection or drainage bilaterally.  Orthopedic  No limitations of motion  feet .  No crepitus or effusions noted.  No bony pathology or digital deformities noted.  HAV  B/L  Skin  normotropic skin with no porokeratosis noted bilaterally.  No signs of infections or ulcers noted.     Onychomycosis  Pain in right toes  Pain in left toes  Consent was obtained for treatment procedures.   Mechanical debridement of nails 1-5  bilaterally performed with a nail nipper.  Filed with dremel without incident.  Patient requests diabetic cream per Dr.  Adah Perl.  Order medicine from [employer].   Return office visit   3 months                   Told patient to return for periodic foot care and evaluation due to potential at risk complications.   Gardiner Barefoot DPM

## 2021-03-05 ENCOUNTER — Encounter: Payer: Self-pay | Admitting: Hematology

## 2021-03-05 ENCOUNTER — Other Ambulatory Visit: Payer: Self-pay

## 2021-03-05 ENCOUNTER — Inpatient Hospital Stay: Payer: Medicare Other

## 2021-03-05 ENCOUNTER — Inpatient Hospital Stay (HOSPITAL_BASED_OUTPATIENT_CLINIC_OR_DEPARTMENT_OTHER): Payer: Medicare Other | Admitting: Hematology

## 2021-03-05 VITALS — BP 156/51 | HR 66 | Resp 18 | Wt 132.0 lb

## 2021-03-05 DIAGNOSIS — Z9011 Acquired absence of right breast and nipple: Secondary | ICD-10-CM | POA: Diagnosis not present

## 2021-03-05 DIAGNOSIS — C221 Intrahepatic bile duct carcinoma: Secondary | ICD-10-CM | POA: Diagnosis not present

## 2021-03-05 DIAGNOSIS — T451X5D Adverse effect of antineoplastic and immunosuppressive drugs, subsequent encounter: Secondary | ICD-10-CM

## 2021-03-05 DIAGNOSIS — Z853 Personal history of malignant neoplasm of breast: Secondary | ICD-10-CM | POA: Diagnosis not present

## 2021-03-05 DIAGNOSIS — E1140 Type 2 diabetes mellitus with diabetic neuropathy, unspecified: Secondary | ICD-10-CM | POA: Diagnosis not present

## 2021-03-05 DIAGNOSIS — I701 Atherosclerosis of renal artery: Secondary | ICD-10-CM

## 2021-03-05 DIAGNOSIS — E1122 Type 2 diabetes mellitus with diabetic chronic kidney disease: Secondary | ICD-10-CM | POA: Diagnosis not present

## 2021-03-05 DIAGNOSIS — N183 Chronic kidney disease, stage 3 unspecified: Secondary | ICD-10-CM

## 2021-03-05 DIAGNOSIS — K219 Gastro-esophageal reflux disease without esophagitis: Secondary | ICD-10-CM

## 2021-03-05 DIAGNOSIS — G620 Drug-induced polyneuropathy: Secondary | ICD-10-CM

## 2021-03-05 DIAGNOSIS — K449 Diaphragmatic hernia without obstruction or gangrene: Secondary | ICD-10-CM | POA: Diagnosis not present

## 2021-03-05 DIAGNOSIS — I130 Hypertensive heart and chronic kidney disease with heart failure and stage 1 through stage 4 chronic kidney disease, or unspecified chronic kidney disease: Secondary | ICD-10-CM

## 2021-03-05 DIAGNOSIS — C786 Secondary malignant neoplasm of retroperitoneum and peritoneum: Secondary | ICD-10-CM

## 2021-03-05 DIAGNOSIS — Z95828 Presence of other vascular implants and grafts: Secondary | ICD-10-CM

## 2021-03-05 DIAGNOSIS — I5032 Chronic diastolic (congestive) heart failure: Secondary | ICD-10-CM

## 2021-03-05 LAB — CMP (CANCER CENTER ONLY)
ALT: 10 U/L (ref 0–44)
AST: 17 U/L (ref 15–41)
Albumin: 3.8 g/dL (ref 3.5–5.0)
Alkaline Phosphatase: 76 U/L (ref 38–126)
Anion gap: 8 (ref 5–15)
BUN: 44 mg/dL — ABNORMAL HIGH (ref 8–23)
CO2: 26 mmol/L (ref 22–32)
Calcium: 9 mg/dL (ref 8.9–10.3)
Chloride: 108 mmol/L (ref 98–111)
Creatinine: 1.51 mg/dL — ABNORMAL HIGH (ref 0.44–1.00)
GFR, Estimated: 34 mL/min — ABNORMAL LOW (ref >60)
Glucose, Bld: 133 mg/dL — ABNORMAL HIGH (ref 70–99)
Potassium: 4 mmol/L (ref 3.5–5.1)
Sodium: 142 mmol/L (ref 135–145)
Total Bilirubin: 0.3 mg/dL (ref 0.3–1.2)
Total Protein: 7 g/dL (ref 6.5–8.1)

## 2021-03-05 LAB — CBC WITH DIFFERENTIAL (CANCER CENTER ONLY)
Abs Immature Granulocytes: 0.02 10*3/uL (ref 0.00–0.07)
Basophils Absolute: 0 10*3/uL (ref 0.0–0.1)
Basophils Relative: 0 %
Eosinophils Absolute: 0.2 10*3/uL (ref 0.0–0.5)
Eosinophils Relative: 4 %
HCT: 34.7 % — ABNORMAL LOW (ref 36.0–46.0)
Hemoglobin: 10.8 g/dL — ABNORMAL LOW (ref 12.0–15.0)
Immature Granulocytes: 0 %
Lymphocytes Relative: 23 %
Lymphs Abs: 1.4 10*3/uL (ref 0.7–4.0)
MCH: 29.8 pg (ref 26.0–34.0)
MCHC: 31.1 g/dL (ref 30.0–36.0)
MCV: 95.9 fL (ref 80.0–100.0)
Monocytes Absolute: 0.6 10*3/uL (ref 0.1–1.0)
Monocytes Relative: 11 %
Neutro Abs: 3.7 10*3/uL (ref 1.7–7.7)
Neutrophils Relative %: 62 %
Platelet Count: 216 10*3/uL (ref 150–400)
RBC: 3.62 MIL/uL — ABNORMAL LOW (ref 3.87–5.11)
RDW: 15.1 % (ref 11.5–15.5)
WBC Count: 6 10*3/uL (ref 4.0–10.5)
nRBC: 0 % (ref 0.0–0.2)

## 2021-03-05 MED ORDER — SODIUM CHLORIDE 0.9% FLUSH
10.0000 mL | Freq: Once | INTRAVENOUS | Status: AC
  Administered 2021-03-05: 09:00:00 10 mL

## 2021-03-05 MED ORDER — HEPARIN SOD (PORK) LOCK FLUSH 100 UNIT/ML IV SOLN
500.0000 [IU] | Freq: Once | INTRAVENOUS | Status: AC
  Administered 2021-03-05: 09:00:00 500 [IU]

## 2021-03-05 NOTE — Progress Notes (Signed)
[place]   [phone] [phone]   Clinic Follow up Note   Patient Care Team: [name], MD as PCP - General (Family Medicine) [name], [name], MD as PCP - Cardiology (Cardiology) [name], [name], MD as Consulting Physician (Cardiology) [name], MD as Consulting Physician (Obstetrics and Gynecology) [name], [name], NP as Nurse Practitioner (Hematology and Oncology) [facility] [name], MD as Consulting Physician (Hematology) [name], [name], MD as Consulting Physician (Gastroenterology) [name], RN as Oncology Nurse Navigator [name], MD as Consulting Physician (General Surgery)  Date of Service:  03/05/2021  CHIEF COMPLAINT: f/u of cholangiocarcinoma  PREVIOUS THERAPY:  1. Maintenance single agent Gemcitabine q2weeks starting on 06/28/19 with C16. On chemo break since 12/27/19. She restarted Single Agent Gemcitabine q2weeks on 04/03/20 due to mild disease progression. 2. Adding imfinzi due to mild progression on 09/26/20 PET scan, every 4 weeks, stopped after 12/25/20   CURRENT THERAPY Pending liver targeted therapy Y90 -Ivoseidenib $RemoveBeforeDE'500mg'KORxXZCDQCQEbox$  daily, started 02/25/21  ASSESSMENT & PLAN:  Donna May is a 80 y.o. female with   1. Intrahepatic cholangiocarcinoma, cT1N0M1, with peritoneal metastasis, MSS, IDH1 mutation (+) -Diagnosed in 09/2018 biopsy of her liver mass shows adenocarcinoma, most consistent with cholangiocarcinoma. 09/29/18 PET scan showed no evidence of distant metastasis. -she was found to have peritoneal metastasis to right diaphragm and surgery was aborted. -Her CT AP from 11/29/18 shows dominate liver mass stable to mild increase, mild nodularity of both adrenal glands which warrants being monitored.  No visible peritoneal metastasis on CT scan. -FO results showed MSI stable disease, IDH mutation positive.  Consider IDH 1 inhibitor in the future.   -She began first  line oxaliplatin (due to CKD) and gemcitabine every 2 weeks on 11/30/2018. Oxali was dose reduced and eventually stopped after cycle 11. She received single agent gemcitabine with cycle 12. Cisplatin added with C13 on 05/17/19 but neuropathy worsened and stopped on 06/14/19. She received gemcitabine every 2 weeks starting from 06/28/19 C16. She took chemo break from 10/21 - 1/22 -PET from 03/14/20 showed concern for disease progression in liver, she restarted maintenance gemcitabine every 2 weeks from 04/03/20. -I added durvalumab to gemcitabine, due to recurrent disease on PET 09/2020. She tolerated well overall, except persistent neuropathy  -restaging PET on 01/02/21 showed further progression of cancer in liver. I stopped gemcitabine  -she met with IR on 01/30/21 to discuss possible Y90. She was felt to be a good candidate. She will need clearance from her cardiologist and nephrologist before procedure. She is awaiting cardiac clearance and then planning scans. I did talk to her nephrologist Dr. Joelyn Oms and he cleared her for procedure  -she was started on ivosidenib 500 mg daily on 02/25/21 while awaiting Y90. She is tolerating very well. I discussed that the Y90 would likely not cure her cancer. Because of this, she will likely need to stay on treatment most of the time. I offered her a break after Y90, but advised her she will need to restart the medicine if or when her cancer grows again. -we will obtain repeat EKG today. -f/u in 2 weeks    2.  Grade 3 CIPN  -secondary to Oxaliplatin, eventually dc'd after cycle 11.  Her diabetes may contribute to it also. -started cisplatin on 05/17/19, neuropathy worsened in her feet with unsteady gait and functional limitations, decreased vibratory sense on tuning fork exam 05/31/19. Cisplatin was dose reduced then eventually stopped on 06/14/19.  -low efficacy of  gabapentin and cymbalta, also tried lyrica briefly.  She completed PT for balance. Ambulates with cane to  avoid fall -Follow-up open with Dr. Mickeal Skinner, pt did not find helpful. Also seen by chiropractor   3. H/o right breast cancer, Genetics negative  -s/p mastectomy in 1992, per patient did not require adjuvant therapy -previously followed by Dr. Jana Hakim  -VUS of gene APC; genetics otherwise normal   4. Comorbidities: DM, HTN, hiatal hernia, GERD, renal artery stenosis, CHF -Follow-up with PCP, nephrology, and Dr. Dwyane Dee, and cardiology -stable, well controlled   5. Family support -she has a son and daughter who are supportive. -patient remains independent    6. Goal of care discussion -We frequently discuss the goal of her treatment is palliative and that her cancer is not curable with chemo alone at this stage. The goal is to control her disease, prolong her life, and give her good quality of life. She understands -she is full code now    7. CKD Stage III -continue monitoring, Scr 1.3 - 1.6     PLAN: -continue Ivosidenib 500mg  daily -EKG today reviewed, QTc 448ms, I will ask her cardiologist Dr. Sallyanne Kuster to review her EKG also  -lab and f/u in 2 weeks as scheduled   No problem-specific Assessment & Plan notes found for this encounter.   SUMMARY OF ONCOLOGIC HISTORY: Oncology History Overview Note  Cancer Staging Intrahepatic cholangiocarcinoma ([place]) Staging form: Intrahepatic Bile Duct, AJCC 8th Edition - Clinical stage from 09/23/2018: Stage IB (cT1b, cN0, cM0) - Signed by [name], MD on 10/06/2018 Histologic grade (G): G3 Histologic grading system: 3 grade system    Intrahepatic cholangiocarcinoma ([place])  09/07/2018 Imaging   CT Chest IMPRESSION: 1. New, enhancing mass involving segment 4 of the liver and fundus of gallbladder is concerning for malignancy. This may represent either metastatic disease from breast cancer or neoplasm primary to the liver or hepatic biliary tree. Further evaluation with contrast enhanced CT of the abdomen and pelvis is recommended. 2. No  findings to suggest metastatic disease within the chest. 3.  Aortic Atherosclerosis (ICD10-I70.0). 4. Coronary artery calcifications.   09/13/2018 Pathology Results   Diagnosis Liver, needle/core biopsy - ADENOCARCINOMA. Microscopic Comment Immunohistochemistry for CK7 is positive. CK20, TTF1, CDX-2, GATA-3, PAX 8, Qualitative ER, p63 and CK5/6 are negative. The provided clinical history of remote mammary carcinoma is noted. Based on the morphology and immunophenotype of the adenocarcinoma observed in this specimen, primary cholangiocarcinoma is favored. Clinical and radiologic correlation are  encouraged. Results reported to [employer] on 09/15/2018. Intradepartmental consultation (Dr. Vic Ripper).   09/13/2018 Initial Diagnosis   Cholangiocarcinoma ([place])   09/23/2018 Procedure   Colonoscopy by Dr. Havery Moros 09/23/18  IMPRESSION - Two 3 to 4 mm polyps in the ascending colon, removed with a cold snare. Resected and retrieved. - Five 3 to 5 mm polyps in the transverse colon, removed with a cold snare. Resected and retrieved. - One 5 mm polyp at the splenic flexure, removed with a cold snare. Resected and retrieved. - Three 3 to 5 mm polyps in the sigmoid colon, removed with a cold snare. Resected and retrieved. - The examination was otherwise normal. Upper Endopscy by Dr. Havery Moros 09/23/18  IMPRESSION - Esophagogastric landmarks identified. - 2 cm hiatal hernia. - Normal esophagus otherwise. - A single gastric polyp. Resected and retrieved. - Mild gastritis. Biopsied. - Normal duodenal bulb and second portion of the duodenum.   09/23/2018 Pathology Results   Diagnosis 09/23/18 1. Surgical [P], duodenum - BENIGN SMALL  BOWEL MUCOSA. - NO ACTIVE INFLAMMATION OR VILLOUS ATROPHY IDENTIFIED. 2. Surgical [P], stomach, polyp - HYPERPLASTIC POLYP(S). - THERE IS NO EVIDENCE OF MALIGNANCY. 3. Surgical [P], gastric antrum and gastric body - CHRONIC INACTIVE GASTRITIS. - THERE IS NO  EVIDENCE OF HELICOBACTER-PYLORI, DYSPLASIA, OR MALIGNANCY. - SEE COMMENT. 4. Surgical [P], colon, sigmoid, splenic flexure, transverse and ascending, polyp (9) - TUBULAR ADENOMA(S). - SESSILE SERRATED POLYP WITHOUT CYTOLOGIC DYSPLASIA. - HIGH GRADE DYSPLASIA IS NOT IDENTIFIED. 5. Surgical [P], colon, sigmoid, polyp (2) - HYPERPLASTIC POLYP(S). - THERE IS NO EVIDENCE OF MALIGNANCY.   09/23/2018 Cancer Staging   Staging form: Intrahepatic Bile Duct, AJCC 8th Edition - Clinical stage from 09/23/2018: Stage IB (cT1b, cN0, cM0) - Signed by [name], MD on 10/06/2018    09/29/2018 PET scan   PET 09/29/18 IMPRESSION: 1. Hypermetabolic mass in the RIGHT hepatic lobe consistent with biopsy proven adenocarcinoma. No additional liver metastasis. 2. No evidence of local breast cancer recurrence in the RIGHT breast or RIGHT axilla. 3. Mild bilateral hypermetabolic adrenal glands is favored benign hyperplasia. 4. No evidence of additional metastatic disease on skull base to thigh FDG PET scan.   11/06/2018 Genetic Testing   Negative genetic testing on the [facility] Common Hereditary Cancers panel. A variant of uncertain significance was identified in one of her APC genes, called c.1243G>A (p.Ala415Thr).  The Common Hereditary Cancers Panel offered by [facility] includes sequencing and/or deletion duplication testing of the following 48 genes: APC, ATM, AXIN2, BARD1, BMPR1A, BRCA1, BRCA2, BRIP1, CDH1, CDK4, CDKN2A (p14ARF), CDKN2A (p16INK4a), CHEK2, CTNNA1, DICER1, EPCAM (Deletion/duplication testing only), GREM1 (promoter region deletion/duplication testing only), KIT, MEN1, MLH1, MSH2, MSH3, MSH6, MUTYH, NBN, NF1, NHTL1, PALB2, PDGFRA, PMS2, POLD1, POLE, PTEN, RAD50, RAD51C, RAD51D, RNF43, SDHB, SDHC, SDHD, SMAD4, SMARCA4. STK11, TP53, TSC1, TSC2, and VHL.  The following genes were evaluated for sequence changes only: SDHA and HOXB13 c.251G>A variant only.    11/17/2018 Pathology Results   Diagnosis  11/17/18 1. Soft tissue, biopsy, Diaphragmatic nodules - METASTATIC ADENOCARCINOMA, CONSISTENT WITH PATIENT'S CLINICAL HISTORY OF CHOLANGIOCARCINOMA. SEE NOTE 2. Liver, biopsy, Left - LIVER PARENCHYMA WITH A BENIGN FIBROTIC NODULE - NO EVIDENCE OF MALIGNANCY 3. Stomach, biopsy - BENIGN PAPILLARY MESOTHELIAL HYPERPLASIA - NO EVIDENCE OF MALIGNANCY   11/29/2018 Imaging   CT CAP WO Contrast  IMPRESSION: 1. Dominant liver mass appears grossly stable from 09/29/2018. Additional liver lesions are too small to characterize but were not shown to be hypermetabolic on PET. 2. Mild nodularity of both adrenal glands with associated hypermetabolism on 09/29/2018. Continued attention on follow-up exams is warranted. 3. Small right lower lobe nodules, stable from 09/07/2018. Again, attention on follow-up is recommended. 4. Trace bilateral pleural fluid. 5. Aortic atherosclerosis (ICD10-170.0). Coronary artery calcification. 6. Enlarged pulmonic trunk, indicative of pulmonary arterial hypertension.     11/30/2018 - 06/14/2019 Chemotherapy   First line chemo Oxaliplatin and gemcitabine q2weeks starting 11/30/18. Stopped Oxaliplatin on 05/03/19 due to worsening neuropathy. Added Cisplatin with C13 on 05/17/19 and stopped on C15 06/14/19 due to neuropathy. Reduced to maintenance single agent Gemcitabine on 06/28/19.    01/20/2019 Imaging   CT CAP IMPRESSION: restaging  1. Mild interval increase in size of dominant liver mass involving segment 4 and segment 5. 2. No significant or progressive adrenal nodularity identified to suggest metastatic disease. 3. Unchanged appearance of small right lower lobe and lingular lung nodules, nonspecific.   03/21/2019 PET scan   IMPRESSION: 1. Large right hepatic mass with SUV uptake near background hepatic activity, dramatic  response to therapy, also with decrease in size when compared to the prior study. 2. Signs of prior right mastectomy and axillary dissection  as before. 3. Left adrenal activity in no longer above the level of activity seen in the contralateral, right adrenal gland or in the liver. Attention on follow-up. 4. No new signs of disease.   06/27/2019 PET scan   IMPRESSION: 1. Further decrease in size of a dominant hepatic mass which is non FDG avid. 2. No evidence of metastatic disease. 3. No evidence of left adrenal hypermetabolism or mass. 4. Incidental findings, including: Uterine fibroids. Coronary artery atherosclerosis. Aortic Atherosclerosis (ICD10-I70.0). Pulmonary artery enlargement suggests pulmonary arterial hypertension.   06/28/2019 -  Chemotherapy   Maintenance single agent Gemcitabine q2weeks starting on 06/28/19 with C16.  ------On chemo break since 12/27/19.  ------She restarted Single Agent Gemcitabine q2weeks on 04/03/20 due to mild disease progression in liver on 02/2020 PET    09/15/2019 PET scan   IMPRESSION: 1. In the region of regional tumor at the junction of the right and left hepatic lobe, there is continued hypodensity and overall activity slightly less than that of the surrounding normal liver, indicating effective response to therapy. No current worrisome hypermetabolic lesion is identified in the liver or elsewhere. 2. Chronic asymmetric edema in the subcutaneous tissues of the right upper extremity, nonspecific. The patient has had a prior right mastectomy and right axillary dissection. 3. Other imaging findings of potential clinical significance: Aortic Atherosclerosis (ICD10-I70.0). Coronary atherosclerosis. Mild cardiomegaly. Small right and trace left pleural effusions. Subcutaneous and mild mesenteric edema. Suspected uterine fibroids.   12/18/2019 PET scan   IMPRESSION: 1. No significant abnormal activity to suggest active/recurrent malignancy. 2. Lingual tonsillar activity is mildly increased from prior but probably incidental/physiologic, attention on follow up suggested. 3. Other  imaging findings of potential clinical significance: Third spacing of fluid with trace ascites, mesenteric edema, subcutaneous edema, and possibly subtle pulmonary edema. Mild cardiomegaly. Aortic Atherosclerosis (ICD10-I70.0). Coronary atherosclerosis. Right ovarian dermoid. Calcified uterine fibroids. Renal cysts. Right glenohumeral arthropathy. Mild to moderate scoliosis. Suspected pulmonary arterial hypertension.   03/14/2020 PET scan   IMPRESSION: 1. New small focus of hypermetabolism in segment 4A of the liver worrisome for new metastatic focus. 2. No other findings for metastatic disease involving the neck, chest, abdomen or pelvis.     06/11/2020 PET scan   IMPRESSION: 1. No suspicious hypermetabolic activity within the neck, chest, abdomen or pelvis. 2. The treated dominant peripheral cholangiocarcinoma appears slightly enlarged compared with recent prior studies, and there is a questionable new lesion more superiorly in the left lobe. Although without associated hypermetabolic activity, these lesions are suspicious and would be better assessed with abdominal MRI without and with contrast.   09/17/2020 PET scan   IMPRESSION: 1. Recurrent hypermetabolic activity around the periphery of the dominant hepatic mass consistent with local recurrence. This mass appears slightly larger. Possible small metastasis in the dome of the left hepatic lobe is unchanged in size, without hypermetabolic activity. 2. No distant metastases identified. 3. Stable incidental findings as detailed above.   10/16/2020 -  Chemotherapy   Patient is on Treatment Plan : BLADDER Durvalumab q28d     01/02/2021 PET scan   IMPRESSION: 1. Further enlargement of hypermetabolic hepatic mass superior to the cholecystectomy bed consistent with progressive local tumor recurrence. Satellite lesion more superiorly in the anterior dome of the liver has also enlarged, without definite hypermetabolic activity. 2. No  distant metastases identified. 3. Stable incidental findings including  aortic atherosclerosis, hepatic and renal cysts and uterine fibroids.      INTERVAL HISTORY:  Donna May is here for a follow up of cholangiocarcinoma. She was last seen by me on 02/25/21. She presents to the clinic alone. She reports she is feeling more tingling in her fingers, but she notes she is unsure if this is related to th medicine or the cold.   All other systems were reviewed with the patient and are negative.  MEDICAL HISTORY:  Past Medical History:  Diagnosis Date   Anemia    Anxiety    Arthritis    Breast cancer (HCC) 1992   right   Cataract    Bilateral eyes - surgery to remove   Chronic diastolic CHF (congestive heart failure) (HCC)    Chronic kidney disease    CKD stage 3   Complication of anesthesia    "something they use make me itch for a couple of days."   Depression    Diabetes mellitus    type 2   Duodenitis 01/18/2002   Fainting spell    GERD (gastroesophageal reflux disease)    Heart murmur    never has caused any problems   Hiatal hernia 08/08/2008, 01/18/2002   History of pneumonia    x 2   Hyperlipidemia    Hypertension    Liver cancer (HCC) 08/2018   chemotherapy   Lymphedema 2017   Right arm   Peripheral neuropathy     SURGICAL HISTORY: Past Surgical History:  Procedure Laterality Date   AXILLARY SURGERY     cyst removal, right   COLONOSCOPY  09/23/2018   Dr. Adela Lank - polyps   EYE SURGERY Bilateral    cataracts to remove   IR RADIOLOGIST EVAL & MGMT  01/30/2021   LAPAROSCOPY N/A 11/17/2018   Procedure: LAPAROSCOPY DIAGNOSTIC, INTRAOPERATIVE ULTRASOUND, PERITONEAL BIOPSIES;  Surgeon: Almond Lint, MD;  Location: [facility] OR;  Service: General;  Laterality: N/A;  GENERAL AND EPIDURAL   LIVER BIOPSY  08/2018   Dr. Rip Harbour   MASTECTOMY  1992   right, with flap   NM MYOCAR PERF WALL MOTION  06/11/2009   Protocol:[place]ruce, post stress EF58%, EKG negative for  ischemia, low risk   PORTACATH PLACEMENT N/A 12/02/2018   Procedure: INSERTION PORT-A-CATH;  Surgeon: Almond Lint, MD;  Location: [facility] OR;  Service: General;  Laterality: N/A;   RECONSTRUCTION BREAST W/ TRAM FLAP Right    TONSILLECTOMY  1958   TRANSTHORACIC ECHOCARDIOGRAM  12/24/2009   LVEF =>55%, normal study   UPPER GI ENDOSCOPY      I have reviewed the social history and family history with the patient and they are unchanged from previous note.  ALLERGIES:  has No Known Allergies.  MEDICATIONS:  Current Outpatient Medications  Medication Sig Dispense Refill   amLODipine (NORVASC) 10 MG tablet Take 1 tablet (10 mg total) by mouth daily. 90 tablet 3   aspirin 81 MG tablet Take 81 mg by mouth daily.      Baclofen 5 MG TABS TAKE 1/2 TABLET BY MOUTH ONCE DAILY AS NEEDED 42 tablet 1   carvedilol (COREG) 25 MG tablet Take 1 tablet (25 mg total) by mouth 2 (two) times daily with a meal. 180 tablet 3   furosemide (LASIX) 40 MG tablet Take 1 tablet (40 mg total) by mouth daily. 30 tablet 11   gabapentin (NEURONTIN) 100 MG capsule Take 1 capsule (100 mg total) by mouth every 8 (eight) hours as needed. 180 capsule 2  glucose blood test strip 1 each by Other route 2 (two) times daily. Use Onetouch verio test strips as instructed to check blood sugar twice daily. 100 each 2   hydrALAZINE (APRESOLINE) 100 MG tablet TAKE THREE TIMES DAILY-AM, MIDDLE OF THE DAY AND IN THE EVENING. 270 tablet 3   Ivosidenib (TIBSOVO) 250 MG TABS Take 2 tablets ($RemoveBe'500mg'mCZlAcFiW$ ) by mouth daily. 60 tablet 0   KLOR-CON M10 10 MEQ tablet TAKE 1 TABLET BY MOUTH EVERY DAY 90 tablet 1   lansoprazole (PREVACID) 15 MG capsule Take 1 capsule (15 mg total) by mouth daily at 12 noon. Take 1 capsule by mouth once or twice a day as needed 90 capsule 3   lidocaine-prilocaine (EMLA) cream Apply 1 application topically as needed. 30 g 1   loratadine (CLARITIN) 10 MG tablet Take 10 mg by mouth daily as needed for allergies.     Omega 3 1000 MG  CAPS Take 2,000 mg by mouth daily.      ondansetron (ZOFRAN-ODT) 4 MG disintegrating tablet Take 1 tablet (4 mg total) by mouth every 8 (eight) hours as needed for nausea or vomiting. 30 tablet 1   polyethylene glycol (MIRALAX / GLYCOLAX) 17 g packet Take 17 g by mouth daily as needed. 14 each 0   sitaGLIPtin (JANUVIA) 100 MG tablet Take 1 tablet (100 mg total) by mouth daily. 90 tablet 3   valsartan (DIOVAN) 320 MG tablet Take 1 tablet (320 mg total) by mouth daily. 90 tablet 3   Current Facility-Administered Medications  Medication Dose Route Frequency Provider Last Rate Last Admin   triamcinolone acetonide (KENALOG) 10 MG/ML injection 10 mg  10 mg Other Once Harriet Masson, DPM        PHYSICAL EXAMINATION: ECOG PERFORMANCE STATUS: 2 - Symptomatic, <50% confined to bed  Vitals:   03/05/21 0956  BP: (!) 156/51  Pulse: 66  Resp: 18  SpO2: 100%   Wt Readings from Last 3 Encounters:  03/05/21 132 lb (59.9 kg)  02/25/21 131 lb 6.4 oz (59.6 kg)  02/04/21 130 lb 8 oz (59.2 kg)     GENERAL:alert, no distress and comfortable SKIN: skin color normal, no rashes or significant lesions EYES: normal, Conjunctiva are pink and non-injected, sclera clear  NEURO: alert & oriented x 3 with fluent speech  LABORATORY DATA:  I have reviewed the data as listed CBC Latest Ref Rng & Units 03/05/2021 02/25/2021 02/04/2021  WBC 4.0 - 10.5 K/uL 6.0 6.9 5.4  Hemoglobin 12.0 - 15.0 g/dL 10.8(L) 11.4(L) 10.3(L)  Hematocrit 36.0 - 46.0 % 34.7(L) 35.5(L) 32.9(L)  Platelets 150 - 400 K/uL 216 208 247     CMP Latest Ref Rng & Units 03/05/2021 02/25/2021 02/04/2021  Glucose 70 - 99 mg/dL 133(H) 76 106(H)  BUN 8 - 23 mg/dL 44(H) 32(H) 35(H)  Creatinine 0.44 - 1.00 mg/dL 1.51(H) 1.30(H) 1.53(H)  Sodium 135 - 145 mmol/L 142 139 141  Potassium 3.5 - 5.1 mmol/L 4.0 4.2 4.4  Chloride 98 - 111 mmol/L 108 106 109  CO2 22 - 32 mmol/L $RemoveB'26 26 26  'KEVSpgvE$ Calcium 8.9 - 10.3 mg/dL 9.0 8.8(L) 9.0  Total Protein 6.5 - 8.1  g/dL 7.0 7.0 7.1  Total Bilirubin 0.3 - 1.2 mg/dL 0.3 0.3 <0.2(L)  Alkaline Phos 38 - 126 U/L 76 78 74  AST 15 - 41 U/L $Remo'17 21 17  'IwyoS$ ALT 0 - 44 U/L $Remo'10 13 9      'cYhFw$ RADIOGRAPHIC STUDIES: I have personally reviewed the radiological images as  listed and agreed with the findings in the report. No results found.    No orders of the defined types were placed in this encounter.  All questions were answered. The patient knows to call the clinic with any problems, questions or concerns. No barriers to learning was detected. The total time spent in the appointment was 30 minutes.     [name], MD 03/05/2021   I, Wilburn Mylar, am acting as scribe for [name], MD.   I have reviewed the above documentation for accuracy and completeness, and I agree with the above.

## 2021-03-07 ENCOUNTER — Ambulatory Visit (HOSPITAL_COMMUNITY)
Admission: RE | Admit: 2021-03-07 | Discharge: 2021-03-07 | Disposition: A | Discharge status: Home / Self Care | Payer: Medicare Other | Source: Ambulatory Visit | Attending: Interventional Radiology | Admitting: Interventional Radiology

## 2021-03-07 ENCOUNTER — Other Ambulatory Visit: Payer: Self-pay

## 2021-03-07 DIAGNOSIS — C221 Intrahepatic bile duct carcinoma: Secondary | ICD-10-CM | POA: Insufficient documentation

## 2021-03-07 DIAGNOSIS — N281 Cyst of kidney, acquired: Secondary | ICD-10-CM | POA: Diagnosis not present

## 2021-03-07 DIAGNOSIS — J9811 Atelectasis: Secondary | ICD-10-CM | POA: Diagnosis not present

## 2021-03-07 MED ORDER — SODIUM CHLORIDE (PF) 0.9 % IJ SOLN
INTRAMUSCULAR | Status: AC
  Filled 2021-03-07: qty 50

## 2021-03-07 MED ORDER — HEPARIN SOD (PORK) LOCK FLUSH 100 UNIT/ML IV SOLN
INTRAVENOUS | Status: AC
  Filled 2021-03-07: qty 5

## 2021-03-07 MED ORDER — HEPARIN SOD (PORK) LOCK FLUSH 100 UNIT/ML IV SOLN: 500.0000 [IU] | Freq: Once | INTRAVENOUS | Status: DC

## 2021-03-07 MED ORDER — IOHEXOL 350 MG/ML SOLN
80.0000 mL | Freq: Once | INTRAVENOUS | Status: AC | PRN
  Administered 2021-03-07: 13:00:00 80 mL via INTRAVENOUS

## 2021-03-11 ENCOUNTER — Other Ambulatory Visit (HOSPITAL_COMMUNITY): Payer: Self-pay

## 2021-03-13 ENCOUNTER — Other Ambulatory Visit (HOSPITAL_COMMUNITY): Payer: Self-pay

## 2021-03-13 ENCOUNTER — Other Ambulatory Visit: Payer: Self-pay | Admitting: Hematology

## 2021-03-13 DIAGNOSIS — Z803 Family history of malignant neoplasm of breast: Secondary | ICD-10-CM | POA: Diagnosis not present

## 2021-03-13 DIAGNOSIS — C50919 Malignant neoplasm of unspecified site of unspecified female breast: Secondary | ICD-10-CM | POA: Diagnosis not present

## 2021-03-13 DIAGNOSIS — I10 Essential (primary) hypertension: Secondary | ICD-10-CM | POA: Diagnosis not present

## 2021-03-13 DIAGNOSIS — E119 Type 2 diabetes mellitus without complications: Secondary | ICD-10-CM | POA: Diagnosis not present

## 2021-03-13 DIAGNOSIS — Z1379 Encounter for other screening for genetic and chromosomal anomalies: Secondary | ICD-10-CM | POA: Diagnosis not present

## 2021-03-13 DIAGNOSIS — Z853 Personal history of malignant neoplasm of breast: Secondary | ICD-10-CM | POA: Diagnosis not present

## 2021-03-13 MED ORDER — TIBSOVO 250 MG PO TABS: 500.0000 mg | ORAL_TABLET | Freq: Every day | ORAL | 1 refills | Status: DC

## 2021-03-14 ENCOUNTER — Other Ambulatory Visit (HOSPITAL_COMMUNITY): Payer: Self-pay

## 2021-03-14 ENCOUNTER — Telehealth: Payer: Self-pay | Admitting: Pharmacist

## 2021-03-14 DIAGNOSIS — C221 Intrahepatic bile duct carcinoma: Secondary | ICD-10-CM

## 2021-03-14 MED ORDER — TIBSOVO 250 MG PO TABS
500.0000 mg | ORAL_TABLET | Freq: Every day | ORAL | 1 refills | Status: DC
Start: 2021-03-14 — End: 2021-04-04

## 2021-03-14 NOTE — Telephone Encounter (Signed)
Oral Oncology Pharmacist Encounter  Notified by [facility] that they are unable to fill Tibsovo due to it being a limited distribution drug.  [facility] is contracted to fill instead. Will reroute Tibsovo to [facility] for dispensing for next fill.   Leron Croak, PharmD, BCPS Hematology/Oncology Clinical Pharmacist [facility] [phone] 03/14/2021 11:23 AM

## 2021-03-18 ENCOUNTER — Other Ambulatory Visit: Payer: Self-pay

## 2021-03-19 DIAGNOSIS — Z1379 Encounter for other screening for genetic and chromosomal anomalies: Secondary | ICD-10-CM | POA: Diagnosis not present

## 2021-03-19 DIAGNOSIS — C50919 Malignant neoplasm of unspecified site of unspecified female breast: Secondary | ICD-10-CM | POA: Diagnosis not present

## 2021-03-19 DIAGNOSIS — Z803 Family history of malignant neoplasm of breast: Secondary | ICD-10-CM | POA: Diagnosis not present

## 2021-03-19 DIAGNOSIS — Z853 Personal history of malignant neoplasm of breast: Secondary | ICD-10-CM | POA: Diagnosis not present

## 2021-03-19 DIAGNOSIS — I10 Essential (primary) hypertension: Secondary | ICD-10-CM | POA: Diagnosis not present

## 2021-03-19 DIAGNOSIS — E119 Type 2 diabetes mellitus without complications: Secondary | ICD-10-CM | POA: Diagnosis not present

## 2021-03-20 ENCOUNTER — Other Ambulatory Visit: Payer: Self-pay

## 2021-03-20 ENCOUNTER — Inpatient Hospital Stay (HOSPITAL_BASED_OUTPATIENT_CLINIC_OR_DEPARTMENT_OTHER): Payer: Medicare Other | Admitting: Hematology

## 2021-03-20 ENCOUNTER — Inpatient Hospital Stay: Payer: Medicare Other | Attending: Adult Health

## 2021-03-20 ENCOUNTER — Encounter: Payer: Self-pay | Admitting: Hematology

## 2021-03-20 VITALS — BP 153/59 | HR 67 | Temp 98.6°F | Resp 18 | Ht 64.0 in | Wt 131.7 lb

## 2021-03-20 DIAGNOSIS — G620 Drug-induced polyneuropathy: Secondary | ICD-10-CM | POA: Diagnosis not present

## 2021-03-20 DIAGNOSIS — Z95828 Presence of other vascular implants and grafts: Secondary | ICD-10-CM

## 2021-03-20 DIAGNOSIS — C221 Intrahepatic bile duct carcinoma: Secondary | ICD-10-CM

## 2021-03-20 DIAGNOSIS — T451X5A Adverse effect of antineoplastic and immunosuppressive drugs, initial encounter: Secondary | ICD-10-CM | POA: Diagnosis not present

## 2021-03-20 DIAGNOSIS — C786 Secondary malignant neoplasm of retroperitoneum and peritoneum: Secondary | ICD-10-CM | POA: Diagnosis not present

## 2021-03-20 DIAGNOSIS — N183 Chronic kidney disease, stage 3 unspecified: Secondary | ICD-10-CM | POA: Insufficient documentation

## 2021-03-20 DIAGNOSIS — Z79899 Other long term (current) drug therapy: Secondary | ICD-10-CM | POA: Insufficient documentation

## 2021-03-20 LAB — CBC WITH DIFFERENTIAL (CANCER CENTER ONLY)
Abs Immature Granulocytes: 0.02 10*3/uL (ref 0.00–0.07)
Basophils Absolute: 0 10*3/uL (ref 0.0–0.1)
Basophils Relative: 0 %
Eosinophils Absolute: 0.2 10*3/uL (ref 0.0–0.5)
Eosinophils Relative: 3 %
HCT: 34.3 % — ABNORMAL LOW (ref 36.0–46.0)
Hemoglobin: 11.1 g/dL — ABNORMAL LOW (ref 12.0–15.0)
Immature Granulocytes: 0 %
Lymphocytes Relative: 26 %
Lymphs Abs: 1.5 10*3/uL (ref 0.7–4.0)
MCH: 30.3 pg (ref 26.0–34.0)
MCHC: 32.4 g/dL (ref 30.0–36.0)
MCV: 93.7 fL (ref 80.0–100.0)
Monocytes Absolute: 0.7 10*3/uL (ref 0.1–1.0)
Monocytes Relative: 12 %
Neutro Abs: 3.3 10*3/uL (ref 1.7–7.7)
Neutrophils Relative %: 59 %
Platelet Count: 190 10*3/uL (ref 150–400)
RBC: 3.66 MIL/uL — ABNORMAL LOW (ref 3.87–5.11)
RDW: 14.5 % (ref 11.5–15.5)
WBC Count: 5.7 10*3/uL (ref 4.0–10.5)
nRBC: 0 % (ref 0.0–0.2)

## 2021-03-20 LAB — CMP (CANCER CENTER ONLY)
ALT: 6 U/L (ref 0–44)
AST: 18 U/L (ref 15–41)
Albumin: 3.7 g/dL (ref 3.5–5.0)
Alkaline Phosphatase: 81 U/L (ref 38–126)
Anion gap: 7 (ref 5–15)
BUN: 40 mg/dL — ABNORMAL HIGH (ref 8–23)
CO2: 29 mmol/L (ref 22–32)
Calcium: 9 mg/dL (ref 8.9–10.3)
Chloride: 104 mmol/L (ref 98–111)
Creatinine: 1.61 mg/dL — ABNORMAL HIGH (ref 0.44–1.00)
GFR, Estimated: 32 mL/min — ABNORMAL LOW (ref >60)
Glucose, Bld: 105 mg/dL — ABNORMAL HIGH (ref 70–99)
Potassium: 4.2 mmol/L (ref 3.5–5.1)
Sodium: 140 mmol/L (ref 135–145)
Total Bilirubin: 0.3 mg/dL (ref 0.3–1.2)
Total Protein: 7 g/dL (ref 6.5–8.1)

## 2021-03-20 MED ORDER — SODIUM CHLORIDE 0.9% FLUSH
10.0000 mL | Freq: Once | INTRAVENOUS | Status: AC
  Administered 2021-03-20: 10 mL

## 2021-03-20 MED ORDER — HEPARIN SOD (PORK) LOCK FLUSH 100 UNIT/ML IV SOLN
500.0000 [IU] | Freq: Once | INTRAVENOUS | Status: AC
  Administered 2021-03-20: 500 [IU]

## 2021-03-20 NOTE — Progress Notes (Signed)
[place]   [phone] [phone]) [phone]   Clinic Follow up Note   Patient Care Team: [name], MD as PCP - General (Family Medicine) [name], [name], MD as PCP - Cardiology (Cardiology) [name], [name], MD as Consulting Physician (Cardiology) [name], MD as Consulting Physician (Obstetrics and Gynecology) [name], [name], NP as Nurse Practitioner (Hematology and Oncology) [facility] [name], MD as Consulting Physician (Hematology) [name], [name], MD as Consulting Physician (Gastroenterology) [name], RN as Oncology Nurse Navigator [name], MD as Consulting Physician (General Surgery)  Date of Service:  03/20/2021  CHIEF COMPLAINT: f/u of cholangiocarcinoma  PREVIOUS THERAPY:  1. Maintenance single agent Gemcitabine q2weeks starting on 06/28/19 with C16. On chemo break since 12/27/19. She restarted Single Agent Gemcitabine q2weeks on 04/03/20 due to mild disease progression. 2. Adding imfinzi due to mild progression on 09/26/20 PET scan, every 4 weeks, stopped after 12/25/20   CURRENT THERAPY Pending liver targeted therapy Y90 -Ivoseidenib $RemoveBeforeDE'500mg'[id number]$  daily, started 02/25/21  ASSESSMENT & PLAN:  Donna May is a 81 y.o. female with   1. Intrahepatic cholangiocarcinoma, cT1N0M1, with peritoneal metastasis, MSS, IDH1 mutation (+) -Diagnosed in 09/2018 biopsy of her liver mass shows adenocarcinoma, most consistent with cholangiocarcinoma. 09/29/18 PET scan showed no evidence of distant metastasis. -she was found to have peritoneal metastasis to right diaphragm and surgery was aborted. -Her CT AP from 11/29/18 shows dominate liver mass stable to mild increase, mild nodularity of both adrenal glands which warrants being monitored.  No visible peritoneal metastasis on CT scan. -FO results showed MSI stable disease, IDH mutation positive.  Consider IDH 1 inhibitor in the future.   -She began first  line oxaliplatin (due to CKD) and gemcitabine every 2 weeks on 11/30/2018. Oxali was dose reduced and eventually stopped after cycle 11. She received single agent gemcitabine with cycle 12. Cisplatin added with C13 on 05/17/19 but neuropathy worsened and stopped on 06/14/19. She received gemcitabine every 2 weeks starting from 06/28/19 C16. She took chemo break from 10/21 - 1/22 -PET from 03/14/20 showed concern for disease progression in liver, she restarted maintenance gemcitabine every 2 weeks from 04/03/20. -I added durvalumab to gemcitabine, due to recurrent disease on PET 09/2020. She tolerated well overall, except persistent neuropathy  -restaging PET on 01/02/21 showed further progression of cancer in liver. I stopped gemcitabine  -she met with IR on 01/30/21 to discuss possible Y90. She was felt to be a good candidate. She will need clearance from her cardiologist and nephrologist before procedure. She is awaiting cardiac clearance and then planning scans. I did talk to her nephrologist Dr. Joelyn Oms and he cleared her for procedure  -recent CT scan showed mild cancer progression in liver, no other new mets, I discussed with pt  -she was started on ivosidenib 500 mg daily on 02/25/21 while awaiting Y90. She is tolerating well, aside from possibly some increased neuropathy. I discussed other treatment options would be intravenous; however, I would not recommend oxaliplatin given her neuropathy. We will continue on ivosidenib and monitor her neuropathy.  -we will obtain repeat EKG today. -f/u in 2 weeks    2.  Grade 3 CIPN  -secondary to Oxaliplatin, eventually dc'd after cycle 11.  Her diabetes may contribute to it also. -started cisplatin on 05/17/19, neuropathy worsened in her feet with unsteady gait and functional limitations, decreased vibratory sense on tuning fork exam 05/31/19. Cisplatin was dose reduced then eventually stopped on 06/14/19.  -low efficacy of  gabapentin and cymbalta, also tried lyrica  briefly.  She completed PT for balance. Ambulates with cane to avoid fall -Follow-up open with Dr. Mickeal Skinner, pt did not find helpful. Also seen by chiropractor -she reports increased "tightness" to her hands and feet on the ivosidenib. We will monitor.   3. H/o right breast cancer, Genetics negative  -s/p mastectomy in 1992, per patient did not require adjuvant therapy -previously followed by Dr. Jana Hakim  -VUS of gene APC; genetics otherwise normal   4. Comorbidities: DM, HTN, hiatal hernia, GERD, renal artery stenosis, CHF -Follow-up with PCP, nephrology, and Dr. Dwyane Dee, and cardiology -stable, well controlled   5. Family support -she has a son and daughter who are supportive. -patient remains independent    6. Goal of care discussion -We frequently discuss the goal of her treatment is palliative and that her cancer is not curable with chemo alone at this stage. The goal is to control her disease, prolong her life, and give her good quality of life. She understands -she is full code now    7. CKD Stage III -continue monitoring, Scr 1.3 - 1.6     PLAN: -continue Ivosidenib 500mg  daily -EKG today reviewed, QTc normal, ST-T changes are not now, overall unremarkable  -f/u with cardiology scheduled for 03/28/21 -lab, flush, and f/u in 3-4 weeks -f/u with Dr. Pascal Lux for Y90   No problem-specific Assessment & Plan notes found for this encounter.   SUMMARY OF ONCOLOGIC HISTORY: Oncology History Overview Note  Cancer Staging Intrahepatic cholangiocarcinoma ([place]) Staging form: Intrahepatic Bile Duct, AJCC 8th Edition - Clinical stage from 09/23/2018: Stage IB (cT1b, cN0, cM0) - Signed by [name], MD on 10/06/2018 Histologic grade (G): G3 Histologic grading system: 3 grade system    Intrahepatic cholangiocarcinoma ([place])  09/07/2018 Imaging   CT Chest IMPRESSION: 1. New, enhancing mass involving segment 4 of the liver and fundus of gallbladder is concerning for malignancy. This may  represent either metastatic disease from breast cancer or neoplasm primary to the liver or hepatic biliary tree. Further evaluation with contrast enhanced CT of the abdomen and pelvis is recommended. 2. No findings to suggest metastatic disease within the chest. 3.  Aortic Atherosclerosis (ICD10-I70.0). 4. Coronary artery calcifications.   09/13/2018 Pathology Results   Diagnosis Liver, needle/core biopsy - ADENOCARCINOMA. Microscopic Comment Immunohistochemistry for CK7 is positive. CK20, TTF1, CDX-2, GATA-3, PAX 8, Qualitative ER, p63 and CK5/6 are negative. The provided clinical history of remote mammary carcinoma is noted. Based on the morphology and immunophenotype of the adenocarcinoma observed in this specimen, primary cholangiocarcinoma is favored. Clinical and radiologic correlation are  encouraged. Results reported to [employer] on 09/15/2018. Intradepartmental consultation (Dr. Vic Ripper).   09/13/2018 Initial Diagnosis   Cholangiocarcinoma ([place])   09/23/2018 Procedure   Colonoscopy by Dr. Havery Moros 09/23/18  IMPRESSION - Two 3 to 4 mm polyps in the ascending colon, removed with a cold snare. Resected and retrieved. - Five 3 to 5 mm polyps in the transverse colon, removed with a cold snare. Resected and retrieved. - One 5 mm polyp at the splenic flexure, removed with a cold snare. Resected and retrieved. - Three 3 to 5 mm polyps in the sigmoid colon, removed with a cold snare. Resected and retrieved. - The examination was otherwise normal. Upper Endopscy by Dr. Havery Moros 09/23/18  IMPRESSION - Esophagogastric landmarks identified. - 2 cm hiatal hernia. - Normal esophagus otherwise. - A single gastric polyp. Resected and retrieved. - Mild gastritis. Biopsied. - Normal duodenal bulb and  second portion of the duodenum.   09/23/2018 Pathology Results   Diagnosis 09/23/18 1. Surgical [P], duodenum - BENIGN SMALL BOWEL MUCOSA. - NO ACTIVE INFLAMMATION OR VILLOUS ATROPHY  IDENTIFIED. 2. Surgical [P], stomach, polyp - HYPERPLASTIC POLYP(S). - THERE IS NO EVIDENCE OF MALIGNANCY. 3. Surgical [P], gastric antrum and gastric body - CHRONIC INACTIVE GASTRITIS. - THERE IS NO EVIDENCE OF HELICOBACTER-PYLORI, DYSPLASIA, OR MALIGNANCY. - SEE COMMENT. 4. Surgical [P], colon, sigmoid, splenic flexure, transverse and ascending, polyp (9) - TUBULAR ADENOMA(S). - SESSILE SERRATED POLYP WITHOUT CYTOLOGIC DYSPLASIA. - HIGH GRADE DYSPLASIA IS NOT IDENTIFIED. 5. Surgical [P], colon, sigmoid, polyp (2) - HYPERPLASTIC POLYP(S). - THERE IS NO EVIDENCE OF MALIGNANCY.   09/23/2018 Cancer Staging   Staging form: Intrahepatic Bile Duct, AJCC 8th Edition - Clinical stage from 09/23/2018: Stage IB (cT1b, cN0, cM0) - Signed by [name], MD on 10/06/2018    09/29/2018 PET scan   PET 09/29/18 IMPRESSION: 1. Hypermetabolic mass in the RIGHT hepatic lobe consistent with biopsy proven adenocarcinoma. No additional liver metastasis. 2. No evidence of local breast cancer recurrence in the RIGHT breast or RIGHT axilla. 3. Mild bilateral hypermetabolic adrenal glands is favored benign hyperplasia. 4. No evidence of additional metastatic disease on skull base to thigh FDG PET scan.   11/06/2018 Genetic Testing   Negative genetic testing on the [facility] Common Hereditary Cancers panel. A variant of uncertain significance was identified in one of her APC genes, called c.1243G>A (p.Ala415Thr).  The Common Hereditary Cancers Panel offered by [facility] includes sequencing and/or deletion duplication testing of the following 48 genes: APC, ATM, AXIN2, BARD1, BMPR1A, BRCA1, BRCA2, BRIP1, CDH1, CDK4, CDKN2A (p14ARF), CDKN2A (p16INK4a), CHEK2, CTNNA1, DICER1, EPCAM (Deletion/duplication testing only), GREM1 (promoter region deletion/duplication testing only), KIT, MEN1, MLH1, MSH2, MSH3, MSH6, MUTYH, NBN, NF1, NHTL1, PALB2, PDGFRA, PMS2, POLD1, POLE, PTEN, RAD50, RAD51C, RAD51D, RNF43, SDHB, SDHC, SDHD,  SMAD4, SMARCA4. STK11, TP53, TSC1, TSC2, and VHL.  The following genes were evaluated for sequence changes only: SDHA and HOXB13 c.251G>A variant only.    11/17/2018 Pathology Results   Diagnosis 11/17/18 1. Soft tissue, biopsy, Diaphragmatic nodules - METASTATIC ADENOCARCINOMA, CONSISTENT WITH PATIENT'S CLINICAL HISTORY OF CHOLANGIOCARCINOMA. SEE NOTE 2. Liver, biopsy, Left - LIVER PARENCHYMA WITH A BENIGN FIBROTIC NODULE - NO EVIDENCE OF MALIGNANCY 3. Stomach, biopsy - BENIGN PAPILLARY MESOTHELIAL HYPERPLASIA - NO EVIDENCE OF MALIGNANCY   11/29/2018 Imaging   CT CAP WO Contrast  IMPRESSION: 1. Dominant liver mass appears grossly stable from 09/29/2018. Additional liver lesions are too small to characterize but were not shown to be hypermetabolic on PET. 2. Mild nodularity of both adrenal glands with associated hypermetabolism on 09/29/2018. Continued attention on follow-up exams is warranted. 3. Small right lower lobe nodules, stable from 09/07/2018. Again, attention on follow-up is recommended. 4. Trace bilateral pleural fluid. 5. Aortic atherosclerosis (ICD10-170.0). Coronary artery calcification. 6. Enlarged pulmonic trunk, indicative of pulmonary arterial hypertension.     11/30/2018 - 06/14/2019 Chemotherapy   First line chemo Oxaliplatin and gemcitabine q2weeks starting 11/30/18. Stopped Oxaliplatin on 05/03/19 due to worsening neuropathy. Added Cisplatin with C13 on 05/17/19 and stopped on C15 06/14/19 due to neuropathy. Reduced to maintenance single agent Gemcitabine on 06/28/19.    01/20/2019 Imaging   CT CAP IMPRESSION: restaging  1. Mild interval increase in size of dominant liver mass involving segment 4 and segment 5. 2. No significant or progressive adrenal nodularity identified to suggest metastatic disease. 3. Unchanged appearance of small right lower lobe and lingular lung nodules, nonspecific.  03/21/2019 PET scan   IMPRESSION: 1. Large right hepatic mass with SUV  uptake near background hepatic activity, dramatic response to therapy, also with decrease in size when compared to the prior study. 2. Signs of prior right mastectomy and axillary dissection as before. 3. Left adrenal activity in no longer above the level of activity seen in the contralateral, right adrenal gland or in the liver. Attention on follow-up. 4. No new signs of disease.   06/27/2019 PET scan   IMPRESSION: 1. Further decrease in size of a dominant hepatic mass which is non FDG avid. 2. No evidence of metastatic disease. 3. No evidence of left adrenal hypermetabolism or mass. 4. Incidental findings, including: Uterine fibroids. Coronary artery atherosclerosis. Aortic Atherosclerosis (ICD10-I70.0). Pulmonary artery enlargement suggests pulmonary arterial hypertension.   06/28/2019 -  Chemotherapy   Maintenance single agent Gemcitabine q2weeks starting on 06/28/19 with C16.  ------On chemo break since 12/27/19.  ------She restarted Single Agent Gemcitabine q2weeks on 04/03/20 due to mild disease progression in liver on 02/2020 PET    09/15/2019 PET scan   IMPRESSION: 1. In the region of regional tumor at the junction of the right and left hepatic lobe, there is continued hypodensity and overall activity slightly less than that of the surrounding normal liver, indicating effective response to therapy. No current worrisome hypermetabolic lesion is identified in the liver or elsewhere. 2. Chronic asymmetric edema in the subcutaneous tissues of the right upper extremity, nonspecific. The patient has had a prior right mastectomy and right axillary dissection. 3. Other imaging findings of potential clinical significance: Aortic Atherosclerosis (ICD10-I70.0). Coronary atherosclerosis. Mild cardiomegaly. Small right and trace left pleural effusions. Subcutaneous and mild mesenteric edema. Suspected uterine fibroids.   12/18/2019 PET scan   IMPRESSION: 1. No significant abnormal  activity to suggest active/recurrent malignancy. 2. Lingual tonsillar activity is mildly increased from prior but probably incidental/physiologic, attention on follow up suggested. 3. Other imaging findings of potential clinical significance: Third spacing of fluid with trace ascites, mesenteric edema, subcutaneous edema, and possibly subtle pulmonary edema. Mild cardiomegaly. Aortic Atherosclerosis (ICD10-I70.0). Coronary atherosclerosis. Right ovarian dermoid. Calcified uterine fibroids. Renal cysts. Right glenohumeral arthropathy. Mild to moderate scoliosis. Suspected pulmonary arterial hypertension.   03/14/2020 PET scan   IMPRESSION: 1. New small focus of hypermetabolism in segment 4A of the liver worrisome for new metastatic focus. 2. No other findings for metastatic disease involving the neck, chest, abdomen or pelvis.     06/11/2020 PET scan   IMPRESSION: 1. No suspicious hypermetabolic activity within the neck, chest, abdomen or pelvis. 2. The treated dominant peripheral cholangiocarcinoma appears slightly enlarged compared with recent prior studies, and there is a questionable new lesion more superiorly in the left lobe. Although without associated hypermetabolic activity, these lesions are suspicious and would be better assessed with abdominal MRI without and with contrast.   09/17/2020 PET scan   IMPRESSION: 1. Recurrent hypermetabolic activity around the periphery of the dominant hepatic mass consistent with local recurrence. This mass appears slightly larger. Possible small metastasis in the dome of the left hepatic lobe is unchanged in size, without hypermetabolic activity. 2. No distant metastases identified. 3. Stable incidental findings as detailed above.   10/16/2020 -  Chemotherapy   Patient is on Treatment Plan : BLADDER Durvalumab q28d     01/02/2021 PET scan   IMPRESSION: 1. Further enlargement of hypermetabolic hepatic mass superior to the cholecystectomy bed  consistent with progressive local tumor recurrence. Satellite lesion more superiorly in the anterior dome of  the liver has also enlarged, without definite hypermetabolic activity. 2. No distant metastases identified. 3. Stable incidental findings including aortic atherosclerosis, hepatic and renal cysts and uterine fibroids.      INTERVAL HISTORY:  Donna May is here for a follow up of cholangiocarcinoma. She was last seen by me on 03/05/21. She presents to the clinic alone. She reports she feels her neuropathy is somewhat worsened. She describes her hands and feet as feeling "tighter" than before. She notes she is able to do things, it just takes her longer.   All other systems were reviewed with the patient and are negative.  MEDICAL HISTORY:  Past Medical History:  Diagnosis Date   Anemia    Anxiety    Arthritis    Breast cancer ([place]) 1992   right   Cataract    Bilateral eyes - surgery to remove   Chronic diastolic CHF (congestive heart failure) (HCC)    Chronic kidney disease    CKD stage 3   Complication of anesthesia    "something they use make me itch for a couple of days."   Depression    Diabetes mellitus    type 2   Duodenitis 01/18/2002   Fainting spell    GERD (gastroesophageal reflux disease)    Heart murmur    never has caused any problems   Hiatal hernia 08/08/2008, 01/18/2002   History of pneumonia    x 2   Hyperlipidemia    Hypertension    Liver cancer ([place]) 08/2018   chemotherapy   Lymphedema 2017   Right arm   Peripheral neuropathy     SURGICAL HISTORY: Past Surgical History:  Procedure Laterality Date   AXILLARY SURGERY     cyst removal, right   COLONOSCOPY  09/23/2018   Dr. Havery Moros - polyps   EYE SURGERY Bilateral    cataracts to remove   IR RADIOLOGIST EVAL & MGMT  01/30/2021   LAPAROSCOPY N/A 11/17/2018   Procedure: LAPAROSCOPY DIAGNOSTIC, INTRAOPERATIVE ULTRASOUND, PERITONEAL BIOPSIES;  Surgeon: [name], MD;  Location: [facility];  Service: General;  Laterality: N/A;  GENERAL AND EPIDURAL   LIVER BIOPSY  08/2018   Dr. Lindwood Coke   MASTECTOMY  1992   right, with flap   NM [place]  06/11/2009   Protocol:[place], post stress EF58%, EKG negative for ischemia, low risk   PORTACATH PLACEMENT N/A 12/02/2018   Procedure: INSERTION PORT-A-CATH;  Surgeon: [name], MD;  Location: [facility];  Service: General;  Laterality: N/A;   RECONSTRUCTION BREAST W/ TRAM FLAP Right    TONSILLECTOMY  1958   TRANSTHORACIC ECHOCARDIOGRAM  12/24/2009   LVEF =>55%, normal study   UPPER GI ENDOSCOPY      I have reviewed the social history and family history with the patient and they are unchanged from previous note.  ALLERGIES:  has No Known Allergies.  MEDICATIONS:  Current Outpatient Medications  Medication Sig Dispense Refill   amLODipine (NORVASC) 10 MG tablet Take 1 tablet (10 mg total) by mouth daily. 90 tablet 3   aspirin 81 MG tablet Take 81 mg by mouth daily.      Baclofen 5 MG TABS TAKE 1/2 TABLET BY MOUTH ONCE DAILY AS NEEDED 42 tablet 1   carvedilol (COREG) 25 MG tablet Take 1 tablet (25 mg total) by mouth 2 (two) times daily with a meal. 180 tablet 3   furosemide (LASIX) 40 MG tablet Take 1 tablet (40 mg total) by mouth daily. 30 tablet  11   gabapentin (NEURONTIN) 100 MG capsule Take 1 capsule (100 mg total) by mouth every 8 (eight) hours as needed. 180 capsule 2   glucose blood test strip 1 each by Other route 2 (two) times daily. Use Onetouch verio test strips as instructed to check blood sugar twice daily. 100 each 2   hydrALAZINE (APRESOLINE) 100 MG tablet TAKE THREE TIMES DAILY-AM, MIDDLE OF THE DAY AND IN THE EVENING. 270 tablet 3   Ivosidenib (TIBSOVO) 250 MG TABS Take 2 tablets ($RemoveBe'500mg'hEwPgHVTz$ ) by mouth daily. 60 tablet 1   KLOR-CON M10 10 MEQ tablet TAKE 1 TABLET BY MOUTH EVERY DAY 90 tablet 1   lansoprazole (PREVACID) 15 MG capsule Take 1 capsule (15 mg total) by mouth daily at 12 noon. Take 1 capsule by mouth  once or twice a day as needed 90 capsule 3   lidocaine-prilocaine (EMLA) cream Apply 1 application topically as needed. 30 g 1   loratadine (CLARITIN) 10 MG tablet Take 10 mg by mouth daily as needed for allergies.     Omega 3 1000 MG CAPS Take 2,000 mg by mouth daily.      ondansetron (ZOFRAN-ODT) 4 MG disintegrating tablet Take 1 tablet (4 mg total) by mouth every 8 (eight) hours as needed for nausea or vomiting. 30 tablet 1   polyethylene glycol (MIRALAX / GLYCOLAX) 17 g packet Take 17 g by mouth daily as needed. 14 each 0   sitaGLIPtin (JANUVIA) 100 MG tablet Take 1 tablet (100 mg total) by mouth daily. 90 tablet 3   valsartan (DIOVAN) 320 MG tablet Take 1 tablet (320 mg total) by mouth daily. 90 tablet 3   Current Facility-Administered Medications  Medication Dose Route Frequency Provider Last Rate Last Admin   triamcinolone acetonide (KENALOG) 10 MG/ML injection 10 mg  10 mg Other Once Harriet Masson, DPM        PHYSICAL EXAMINATION: ECOG PERFORMANCE STATUS: 2 - Symptomatic, <50% confined to bed  Vitals:   03/20/21 1009  BP: (!) 153/59  Pulse: 67  Resp: 18  Temp: 98.6 F (37 C)  SpO2: 96%   Wt Readings from Last 3 Encounters:  03/20/21 131 lb 11.2 oz (59.7 kg)  03/05/21 132 lb (59.9 kg)  02/25/21 131 lb 6.4 oz (59.6 kg)     GENERAL:alert, no distress and comfortable SKIN: skin color normal, no rashes or significant lesions EYES: normal, Conjunctiva are pink and non-injected, sclera clear  NEURO: alert & oriented x 3 with fluent speech  LABORATORY DATA:  I have reviewed the data as listed CBC Latest Ref Rng & Units 03/20/2021 03/05/2021 02/25/2021  WBC 4.0 - 10.5 K/uL 5.7 6.0 6.9  Hemoglobin 12.0 - 15.0 g/dL 11.1(L) 10.8(L) 11.4(L)  Hematocrit 36.0 - 46.0 % 34.3(L) 34.7(L) 35.5(L)  Platelets 150 - 400 K/uL 190 216 208     CMP Latest Ref Rng & Units 03/20/2021 03/05/2021 02/25/2021  Glucose 70 - 99 mg/dL 105(H) 133(H) 76  BUN 8 - 23 mg/dL 40(H) 44(H) 32(H)   Creatinine 0.44 - 1.00 mg/dL 1.61(H) 1.51(H) 1.30(H)  Sodium 135 - 145 mmol/L 140 142 139  Potassium 3.5 - 5.1 mmol/L 4.2 4.0 4.2  Chloride 98 - 111 mmol/L 104 108 106  CO2 22 - 32 mmol/L $RemoveB'29 26 26  'uAvpCOzr$ Calcium 8.9 - 10.3 mg/dL 9.0 9.0 8.8(L)  Total Protein 6.5 - 8.1 g/dL 7.0 7.0 7.0  Total Bilirubin 0.3 - 1.2 mg/dL 0.3 0.3 0.3  Alkaline Phos 38 - 126 U/L 81 76 78  AST 15 - 41 U/L $Remo'18 17 21  'VJMrL$ ALT 0 - 44 U/L $Remo'6 10 13      'njbfy$ RADIOGRAPHIC STUDIES: I have personally reviewed the radiological images as listed and agreed with the findings in the report. No results found.    No orders of the defined types were placed in this encounter.  All questions were answered. The patient knows to call the clinic with any problems, questions or concerns. No barriers to learning was detected. The total time spent in the appointment was 30 minutes.     [name], MD 03/20/2021   I, Wilburn Mylar, am acting as scribe for [name], MD.   I have reviewed the above documentation for accuracy and completeness, and I agree with the above.

## 2021-03-25 ENCOUNTER — Other Ambulatory Visit: Payer: Self-pay

## 2021-03-25 ENCOUNTER — Other Ambulatory Visit (HOSPITAL_COMMUNITY): Payer: Self-pay | Admitting: Interventional Radiology

## 2021-03-25 DIAGNOSIS — C221 Intrahepatic bile duct carcinoma: Secondary | ICD-10-CM

## 2021-03-25 MED ORDER — PROCHLORPERAZINE MALEATE 10 MG PO TABS: 10.0000 mg | ORAL_TABLET | Freq: Four times a day (QID) | ORAL | 3 refills | Status: AC | PRN

## 2021-03-25 NOTE — Progress Notes (Signed)
Cardiology Clinic Note   Patient Name: Donna May Date of Encounter: 03/28/2021  Primary Care Provider:  [name], MD Primary Cardiologist:  Sanda Klein, MD  Patient Profile    [name] 81 year old female presents the clinic today for  follow-up evaluation of her acute diastolic heart failure and hypertension.  Past Medical History    Past Medical History:  Diagnosis Date   Anemia    Anxiety    Arthritis    Breast cancer ([place]) 1992   right   Cataract    Bilateral eyes - surgery to remove   Chronic diastolic CHF (congestive heart failure) (HCC)    Chronic kidney disease    CKD stage 3   Complication of anesthesia    "something they use make me itch for a couple of days."   Depression    Diabetes mellitus    type 2   Duodenitis 01/18/2002   Fainting spell    GERD (gastroesophageal reflux disease)    Heart murmur    never has caused any problems   Hiatal hernia 08/08/2008, 01/18/2002   History of pneumonia    x 2   Hyperlipidemia    Hypertension    Liver cancer ([place]) 08/2018   chemotherapy   Lymphedema 2017   Right arm   Peripheral neuropathy    Past Surgical History:  Procedure Laterality Date   AXILLARY SURGERY     cyst removal, right   COLONOSCOPY  09/23/2018   Dr. Havery Moros - polyps   EYE SURGERY Bilateral    cataracts to remove   IR RADIOLOGIST EVAL & MGMT  01/30/2021   LAPAROSCOPY N/A 11/17/2018   Procedure: LAPAROSCOPY DIAGNOSTIC, INTRAOPERATIVE ULTRASOUND, PERITONEAL BIOPSIES;  Surgeon: [name], MD;  Location: [facility];  Service: General;  Laterality: N/A;  GENERAL AND EPIDURAL   LIVER BIOPSY  08/2018   Dr. Lindwood Coke   MASTECTOMY  1992   right, with flap   NM [place]  06/11/2009   Protocol:[place], post stress EF58%, EKG negative for ischemia, low risk   PORTACATH PLACEMENT N/A 12/02/2018   Procedure: INSERTION PORT-A-CATH;  Surgeon: [name], MD;  Location: [facility];  Service: General;  Laterality: N/A;    RECONSTRUCTION BREAST W/ TRAM FLAP Right    TONSILLECTOMY  1958   TRANSTHORACIC ECHOCARDIOGRAM  12/24/2009   LVEF =>55%, normal study   UPPER GI ENDOSCOPY      Allergies  No Known Allergies  History of Present Illness    Ms. 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   She was admitted to the hospital 01/18/2020 until 01/22/2020.  She was diagnosed with acute on chronic diastolic CHF, diabetes mellitus with complications of stage III CKD and hypertension.  She presented to the emergency department via EMS for evaluation of sudden onset of shortness of breath that had been present for approximately 6 hours.  When EMS arrived at her place of residence patient had a oxygen saturation of around 70%.  She was placed on nonrebreather but was unable to tolerate it.  She was placed on 3 L of oxygen which improved her O2 saturation to 90%.  In the emergency department her blood pressure was evaluated and read 230/150.  Respiratory rate is in the 40s and her heart rate was  100 bpm.  She was placed on noninvasive mechanical ventilation.  She denied chest pain, nausea, vomiting, diaphoresis and abdominal pain.  She denied urinary symptoms.  Her respiratory viral panel was negative her chest x-ray showed cardiomegaly with diffuse bilateral pulmonary interstitial edema.  EKG showed sinus rhythm with left atrial enlargement.   She received IV diuresis.  Her blood pressure remained high.  At the initiation of her home blood pressure medications blood pressure substantially improved.  Her hydralazine was increased to 100 mg 3 times daily, her metoprolol was transitioned to carvedilol.  Low-salt diet was discussed, volume and fluid restriction.  She  admitted to dietary indiscretion eating fast food fairly frequently which was felt to be a major contributing factor to her heart failure exacerbation.  It was recommended that she have home health PT due to her ambulatory dysfunction and deconditioning.   She presented to the clinic 02/07/2020 for follow-up evaluation and stated she felt fairly well.  She stated she did have some neuropathy in her hands and her feet from cancer treatment.  She had been taking gabapentin which she did not like because it made her tired.  She had been trying her best to stay away from higher sodium food options.  We reviewed daily weights and I expressed that I would like her to call the office with a weight increase of 3 pounds overnight or 5 pounds in a week.  She expressed understanding.  I will gave her the salty 6 diet sheet, asked her to increase her physical activity as tolerated, and follow-up in 3 months.   She presented to the clinic 05/03/20 for follow-up evaluation and stated she felt well.  She continued to notice neuropathy pain in her hands and feet.  She had not been taking her gabapentin but was thinking about resuming it in the evenings.  She reported that she started cancer treatment again at the beginning of January and had 3 infusions.  On her follow-up scan she reported spots were noted on her liver.  She would have a repeat scan in March or April.  She also reported that her oncologist was monitoring her hemoglobin.  Since she had been back on her infusions she  noted to be more anemic.  They were watching this closely.  Her blood pressures were well controlled at home.  She reported that  she did not take her amlodipine or carvedilol before her appointment.  We discussed medication compliance.  She reported that she was staying physically active and walking most days of the week.  I asked her to maintain a blood pressure log, continue her low-salt diet, and follow-up with Dr. Sallyanne Kuster in 6 months.  She  was seen by Dr. Sallyanne Kuster 09/20/2020.  During that time she continued her chemotherapy for biliary cancer.  Her weight stabilized around 126-129 pounds.  She denied chest discomfort.  She denied orthopnea and PND.  She was able to do household chores with no shortness of breath.  She denied palpitations.  She did note focal neurological changes related to her chemotherapy and neuropathy in her fingers and feet.  It was recommended that her hemoglobin not drop below 8.  It was felt that she would not be a good candidate for ischemic evaluation due to her comorbidities.  Follow-up was planned for 12 months.  She presents to the clinic today for follow-up evaluation states she continues to take cancer treatment  daily.  She reports that her tumor has been stable in size.  She is limited in her physical activity due to her neuropathy.  She also reports that her food lacks taste.  We reviewed the importance of using seasonings other than salt for her food.  She expressed understanding.  Her weight has remained fairly stable.  Her blood pressure today is well controlled.  I will give her the salty 6 diet sheet, have her increase her physical activity as tolerated, refill amlodipine, furosemide, potassium, and check her lipids and LFTs.  We will plan follow-up for 6 months.  Today she denies chest pain, shortness of breath, lower extremity edema, fatigue, palpitations, melena, hematuria, hemoptysis, diaphoresis, weakness, presyncope, syncope, orthopnea, and PND.  Home Medications    Prior to Admission medications   Medication Sig Start Date End Date Taking? Authorizing Provider  amLODipine (NORVASC) 10 MG tablet Take 1 tablet (10 mg total) by mouth daily. 10/22/19   Danford, Suann Larry, MD  aspirin 81 MG tablet Take 81 mg by mouth daily.     [provider]  carvedilol (COREG) 25 MG tablet Take 1 tablet (25 mg total) by mouth 2 (two) times daily with a meal. 02/15/20   [name], Mihai, MD  furosemide  (LASIX) 40 MG tablet Take 1 tablet (40 mg total) by mouth daily. 02/22/20   [name], Mihai, MD  glucose blood test strip 1 each by Other route 2 (two) times daily. Use Onetouch verio test strips as instructed to check blood sugar twice daily. 01/05/19   Elayne Snare, MD  hydrALAZINE (APRESOLINE) 100 MG tablet Take 1 tablet (100 mg total) by mouth every 8 (eight) hours. 02/15/20   [name], Mihai, MD  KLOR-CON M10 10 MEQ tablet TAKE 1 TABLET BY MOUTH EVERY DAY 03/04/20   [name], Mihai, MD  lansoprazole (PREVACID) 15 MG capsule Take 1 capsule (15 mg total) by mouth daily. 06/08/19   [name], [name], MD  loratadine (CLARITIN) 10 MG tablet Take 10 mg by mouth daily as needed for allergies.    [provider]  Omega 3 1000 MG CAPS Take 2,000 mg by mouth daily.     [provider]  rosuvastatin (CRESTOR) 20 MG tablet Take 1 tablet (20 mg total) by mouth daily. 02/16/20 05/16/20  Deberah Pelton, NP  sitaGLIPtin (JANUVIA) 100 MG tablet Take 1 tablet (100 mg total) by mouth daily. 04/08/20   [name], MD  valsartan (DIOVAN) 320 MG tablet Take 1 tablet (320 mg total) by mouth daily. 02/07/20   Deberah Pelton, NP    Family History    Family History  Problem Relation Age of Onset   Breast cancer Cousin        diagnosed >50; mother's first cousins   Diabetes Brother    Heart disease Brother    Heart disease Mother    Hypertension Mother    Multiple sclerosis Mother    Heart disease Father    Hypertension Father    Stroke Father    Prostate cancer Brother 9   Heart attack Maternal Grandmother    Stroke Maternal Grandfather    Colon cancer Neg Hx    Esophageal cancer Neg Hx    Stomach cancer Neg Hx    Rectal cancer Neg Hx    She indicated that her mother is deceased. She indicated that her father is deceased. She indicated that only one of her two brothers is alive. She indicated that her maternal grandmother is deceased. She indicated that her maternal grandfather  is deceased. She indicated that her  paternal grandmother is deceased. She indicated that her paternal grandfather is deceased. She indicated that her maternal uncle is deceased. She indicated that her paternal aunt is deceased. She indicated that her paternal uncle is deceased. She indicated that the status of her cousin is unknown. She indicated that the status of her neg hx is unknown.  Social History    Social History   Socioeconomic History   Marital status: Single    Spouse name: Not on file   Number of children: 2   Years of education: 12   Highest education level: Not on file  Occupational History   Occupation: retired    Comment: disabled  Tobacco Use   Smoking status: Former    Packs/day: 0.25    Years: 25.00    Pack years: 6.25    Types: Cigarettes    Quit date: 1990    Years since quitting: 33.0   Smokeless tobacco: Never  Vaping Use   Vaping Use: Never used  Substance and Sexual Activity   Alcohol use: Not Currently    Alcohol/week: 0.0 standard drinks   Drug use: Never   Sexual activity: Yes    Partners: Male    Birth control/protection: Condom, Post-menopausal    Comment: First sexual encounter age 60. Fewer than 5 partners in life time.  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   Has had 3 Covid vaccines   Social Determinants of Radio broadcast assistant Strain: Low Risk    Difficulty of Paying Living Expenses: Not hard at all  Food Insecurity: No Food Insecurity   Worried About Charity fundraiser in the Last Year: Never true   Arboriculturist in the Last Year: Never true  Transportation Needs: No Transportation Needs   Lack of Transportation (Medical): No   Lack of Transportation (Non-Medical): No  Physical Activity:  Inactive   Days of Exercise per Week: 0 days   Minutes of Exercise per Session: 0 min  Stress: No Stress Concern Present   Feeling of Stress : Not at all  Social Connections: Moderately Isolated   Frequency of Communication with Friends and Family: More than three times a week   Frequency of Social Gatherings with Friends and Family: More than three times a week   Attends Religious Services: More than 4 times per year   Active Member of [employer] or Organizations: No   Attends Archivist Meetings: Never   Marital Status: Widowed  Human resources officer Violence: Not At Risk   Fear of Current or Ex-Partner: No   Emotionally Abused: No   Physically Abused: No   Sexually Abused: No     Review of Systems    General:  No chills, fever, night sweats or weight changes.  Cardiovascular:  No chest pain, dyspnea on exertion, edema, orthopnea, palpitations, paroxysmal nocturnal dyspnea. Dermatological: No rash, lesions/masses Respiratory: No cough, dyspnea Urologic: No hematuria, dysuria Abdominal:   No nausea, vomiting, diarrhea, bright red blood per rectum, melena, or hematemesis Neurologic:  No visual changes, wkns, changes in mental status.  Reports tingling in hands and bilateral feet All other systems reviewed and are otherwise negative except as noted above.  Physical Exam  VS:  BP 132/78 (BP Location: Left Arm, Patient Position: Sitting, Cuff Size: Normal)    Pulse 64    Ht 5\' 5"  (1.651 m)    Wt 129 lb 6.4 oz (58.7 kg)    LMP  (LMP Unknown)    SpO2 98%    BMI 21.53 kg/m  , BMI Body mass index is 21.53 kg/m. GEN: Well nourished, well developed, in no acute distress. HEENT: normal. Neck: Supple, no JVD, carotid bruits, or masses. Cardiac: RRR, no murmurs, rubs, or gallops. No clubbing, cyanosis, edema.  Radials/DP/PT 2+ and equal bilaterally.  Respiratory:  Respirations regular and unlabored, clear to auscultation bilaterally. GI: Soft, nontender, nondistended, BS + x 4. MS: no  deformity or atrophy. Skin: warm and dry, no rash. Neuro:  Strength and sensation are intact. Psych: Normal affect.  Accessory Clinical Findings    Recent Labs: 04/03/2020: Magnesium 2.4 10/02/2020: TSH 2.065 03/20/2021: ALT 6; BUN 40; Creatinine 1.61; Hemoglobin 11.1; Platelet Count 190; Potassium 4.2; Sodium 140   Recent Lipid Panel    Component Value Date/Time   CHOL 239 (H) 02/14/2020 1122   TRIG 55 02/14/2020 1122   HDL 85 02/14/2020 1122   CHOLHDL 2.8 02/14/2020 1122   CHOLHDL 3 09/01/2018 1435   VLDL 9.2 09/01/2018 1435   LDLCALC 145 (H) 02/14/2020 1122    ECG personally reviewed by me today- none today.  Echocardiogram 01/03/2020 IMPRESSIONS     1. Left ventricular ejection fraction, by estimation, is 60 to 65%. The  left ventricle has normal function. The left ventricle has no regional  wall motion abnormalities. There is moderate concentric left ventricular  hypertrophy. Left ventricular  diastolic parameters are consistent with Grade III diastolic dysfunction  (restrictive).   2. Right ventricular systolic function is normal. The right ventricular  size is normal. There is severely elevated pulmonary artery systolic  pressure.   3. Left atrial size was severely dilated.   4. Right atrial size was moderately dilated.   5. A small pericardial effusion is present. The pericardial effusion is  circumferential.   6. The mitral valve is normal in structure. Mild mitral valve  regurgitation.   7. Tricuspid valve regurgitation is moderate.   8. The aortic valve is tricuspid. Aortic valve regurgitation is not  visualized. No aortic stenosis is present.   9. The inferior vena cava is dilated in size with >50% respiratory  variability, suggesting right atrial pressure of 8 mmHg.   Comparison(s): No prior Echocardiogram.   Assessment & Plan   1.  Chronic diastolic CHF-continues to be euvolemic today.  No increased DOE or activity intolerance.  Weight today  129.4.  Echocardiogram 01/03/2020 showed an EF of 60-65%, G3 DD.   Continue carvedilol Heart healthy low-sodium diet-salty 6 given Increase physical activity as tolerated Daily weights   Hypertensive -BP today 132/78.    Well-controlled at home.  Continue amlodipine, hydralazine, irbesartan, carvedilol Heart healthy low-sodium diet-salty 6 given Increase physical activity as tolerated Maintain Bp- log    Hyperlipidemia-LDL 145 on 12/21 Heart healthy low-sodium high-fiber diet Increase physical activity as tolerated Repeat fasting lipids and Lfts  Metastatic cholangiocarcinoma-continues chemotherapy treatment.  Not felt to be a candidate for resection. Follows with oncology   Disposition: Follow-up with Dr. Sallyanne Kuster in 6 months.   Jossie Ng. Rylon Poitra NP-C    03/28/2021, 8:08 AM [address] [place] [address] Office [phone] Fax [phone]  Notice: This dictation was prepared with Dragon dictation along with smaller  Company secretary. Any transcriptional errors that result from this process are unintentional and may not be corrected upon review.  I spent 15 minutes examining this patient, reviewing medications, and using patient centered shared decision making involving her cardiac care.  Prior to her visit I spent greater than 20 minutes reviewing her past medical history,  medications, and prior cardiac tests.

## 2021-03-25 NOTE — Progress Notes (Signed)
Discontinued the Zofran d/t drug interaction with pt's chemotherapy pill Tibsovo.  Dr. Burr Medico prescribed Compazine 10mg  PO Q6hrs in place of the Zofran.  Called [facility] and discontinued the Zofran.

## 2021-03-28 ENCOUNTER — Other Ambulatory Visit: Payer: Self-pay

## 2021-03-28 ENCOUNTER — Encounter: Payer: Self-pay | Admitting: General Practice

## 2021-03-28 ENCOUNTER — Ambulatory Visit (INDEPENDENT_AMBULATORY_CARE_PROVIDER_SITE_OTHER): Payer: Medicare Other | Admitting: General Practice

## 2021-03-28 VITALS — BP 132/78 | HR 64 | Ht 65.0 in | Wt 129.4 lb

## 2021-03-28 DIAGNOSIS — I10 Essential (primary) hypertension: Secondary | ICD-10-CM | POA: Diagnosis not present

## 2021-03-28 DIAGNOSIS — Z79899 Other long term (current) drug therapy: Secondary | ICD-10-CM

## 2021-03-28 DIAGNOSIS — I5032 Chronic diastolic (congestive) heart failure: Secondary | ICD-10-CM | POA: Diagnosis not present

## 2021-03-28 DIAGNOSIS — C221 Intrahepatic bile duct carcinoma: Secondary | ICD-10-CM

## 2021-03-28 DIAGNOSIS — E7800 Pure hypercholesterolemia, unspecified: Secondary | ICD-10-CM

## 2021-03-28 DIAGNOSIS — C787 Secondary malignant neoplasm of liver and intrahepatic bile duct: Secondary | ICD-10-CM

## 2021-03-28 LAB — HEPATIC FUNCTION PANEL
ALT: 9 IU/L (ref 0–32)
AST: 23 IU/L (ref 0–40)
Albumin: 4.1 g/dL (ref 3.6–4.6)
Alkaline Phosphatase: 106 IU/L (ref 44–121)
Bilirubin Total: <0.2 mg/dL (ref 0.0–1.2)
Bilirubin, Direct: <0.1 mg/dL (ref 0.00–0.40)
Total Protein: 7 g/dL (ref 6.0–8.5)

## 2021-03-28 LAB — LIPID PANEL
Chol/HDL Ratio: 3.2 ratio (ref 0.0–4.4)
Cholesterol, Total: 231 mg/dL — ABNORMAL HIGH (ref 100–199)
HDL: 72 mg/dL (ref >39)
LDL Chol Calc (NIH): 147 mg/dL — ABNORMAL HIGH (ref 0–99)
Triglycerides: 68 mg/dL (ref 0–149)
VLDL Cholesterol Cal: 12 mg/dL (ref 5–40)

## 2021-03-28 MED ORDER — POTASSIUM CHLORIDE CRYS ER 10 MEQ PO TBCR: 10.0000 meq | EXTENDED_RELEASE_TABLET | Freq: Every day | ORAL | 1 refills | Status: DC

## 2021-03-28 MED ORDER — FUROSEMIDE 40 MG PO TABS: 40.0000 mg | ORAL_TABLET | Freq: Every day | ORAL | 1 refills | Status: DC

## 2021-03-28 MED ORDER — AMLODIPINE BESYLATE 10 MG PO TABS
10.0000 mg | ORAL_TABLET | Freq: Every day | ORAL | 1 refills | Status: AC
Start: 2021-03-28 — End: ?

## 2021-03-28 NOTE — Patient Instructions (Signed)
Medication Instructions:  The current medical regimen is effective;  continue present plan and medications as directed. Please refer to the Current Medication list given to you today.  *If you need a refill on your cardiac medications before your next appointment, please call your pharmacy*  Lab Work: LIPID, LFT TODAY If you have labs (blood work) drawn today and your tests are completely normal, you will receive your results only by:  [place] (if you have MyChart) OR A paper copy in the mail.  If you have any lab test that is abnormal or we need to change your treatment, we will call you to review the results. You may go to any [facility] that is convenient for you however, we do have a lab in our office that is able to assist you. You DO NOT need an appointment for our lab. The lab is open 8:00am and closes at 4:00pm. Lunch 12:45 - 1:45pm.  Special Instructions PLEASE READ AND FOLLOW SALTY 6-ATTACHED-1,800 mg daily  PLEASE INCREASE PHYSICAL ACTIVITY AS TOLERATED  Follow-Up: Your next appointment:  6 month(s) In Person with Sanda Klein, MD   Please call our office 2 months in advance to schedule this appointment   At [facility], you and your health needs are our priority.  As part of our continuing mission to provide you with exceptional heart care, we have created designated Provider Care Teams.  These Care Teams include your primary Cardiologist (physician) and Advanced Practice Providers (APPs -  Physician Assistants and Nurse Practitioners) who all work together to provide you with the care you need, when you need it.  We recommend signing up for the patient portal called "MyChart".  Sign up information is provided on this After Visit Summary.  MyChart is used to connect with patients for Virtual Visits (Telemedicine).  Patients are able to view lab/test results, encounter notes, upcoming appointments, etc.  Non-urgent messages can be sent to your provider as well.   To learn  more about what you can do with MyChart, go to [email or web address].

## 2021-03-29 ENCOUNTER — Other Ambulatory Visit: Payer: Self-pay | Admitting: Cardiovascular Disease

## 2021-04-02 ENCOUNTER — Other Ambulatory Visit: Payer: Self-pay

## 2021-04-02 DIAGNOSIS — Z79899 Other long term (current) drug therapy: Secondary | ICD-10-CM

## 2021-04-02 DIAGNOSIS — E7800 Pure hypercholesterolemia, unspecified: Secondary | ICD-10-CM

## 2021-04-02 MED ORDER — ROSUVASTATIN CALCIUM 20 MG PO TABS: 20.0000 mg | ORAL_TABLET | Freq: Every day | ORAL | 3 refills | Status: DC

## 2021-04-04 ENCOUNTER — Other Ambulatory Visit: Payer: Self-pay

## 2021-04-04 DIAGNOSIS — C221 Intrahepatic bile duct carcinoma: Secondary | ICD-10-CM

## 2021-04-04 MED ORDER — TIBSOVO 250 MG PO TABS: 500.0000 mg | ORAL_TABLET | Freq: Every day | ORAL | 1 refills | Status: DC

## 2021-04-09 ENCOUNTER — Encounter: Payer: Self-pay | Admitting: Hematology

## 2021-04-09 ENCOUNTER — Inpatient Hospital Stay (HOSPITAL_BASED_OUTPATIENT_CLINIC_OR_DEPARTMENT_OTHER): Payer: Medicare Other | Admitting: Hematology

## 2021-04-09 ENCOUNTER — Other Ambulatory Visit: Payer: Self-pay

## 2021-04-09 ENCOUNTER — Inpatient Hospital Stay: Payer: Medicare Other

## 2021-04-09 VITALS — BP 156/56 | HR 62 | Temp 98.6°F | Resp 18 | Ht 65.0 in | Wt 129.5 lb

## 2021-04-09 DIAGNOSIS — Z79899 Other long term (current) drug therapy: Secondary | ICD-10-CM | POA: Diagnosis not present

## 2021-04-09 DIAGNOSIS — C221 Intrahepatic bile duct carcinoma: Secondary | ICD-10-CM

## 2021-04-09 DIAGNOSIS — G620 Drug-induced polyneuropathy: Secondary | ICD-10-CM | POA: Diagnosis not present

## 2021-04-09 DIAGNOSIS — C786 Secondary malignant neoplasm of retroperitoneum and peritoneum: Secondary | ICD-10-CM | POA: Diagnosis not present

## 2021-04-09 DIAGNOSIS — N183 Chronic kidney disease, stage 3 unspecified: Secondary | ICD-10-CM | POA: Diagnosis not present

## 2021-04-09 DIAGNOSIS — T451X5A Adverse effect of antineoplastic and immunosuppressive drugs, initial encounter: Secondary | ICD-10-CM | POA: Diagnosis not present

## 2021-04-09 LAB — CMP (CANCER CENTER ONLY)
ALT: 7 U/L (ref 0–44)
AST: 19 U/L (ref 15–41)
Albumin: 3.7 g/dL (ref 3.5–5.0)
Alkaline Phosphatase: 79 U/L (ref 38–126)
Anion gap: 7 (ref 5–15)
BUN: 49 mg/dL — ABNORMAL HIGH (ref 8–23)
CO2: 27 mmol/L (ref 22–32)
Calcium: 9.1 mg/dL (ref 8.9–10.3)
Chloride: 109 mmol/L (ref 98–111)
Creatinine: 1.79 mg/dL — ABNORMAL HIGH (ref 0.44–1.00)
GFR, Estimated: 28 mL/min — ABNORMAL LOW (ref >60)
Glucose, Bld: 96 mg/dL (ref 70–99)
Potassium: 4.2 mmol/L (ref 3.5–5.1)
Sodium: 143 mmol/L (ref 135–145)
Total Bilirubin: 0.3 mg/dL (ref 0.3–1.2)
Total Protein: 7.2 g/dL (ref 6.5–8.1)

## 2021-04-09 LAB — CBC WITH DIFFERENTIAL (CANCER CENTER ONLY)
Abs Immature Granulocytes: 0.03 10*3/uL (ref 0.00–0.07)
Basophils Absolute: 0 10*3/uL (ref 0.0–0.1)
Basophils Relative: 0 %
Eosinophils Absolute: 0.1 10*3/uL (ref 0.0–0.5)
Eosinophils Relative: 2 %
HCT: 34.8 % — ABNORMAL LOW (ref 36.0–46.0)
Hemoglobin: 11.1 g/dL — ABNORMAL LOW (ref 12.0–15.0)
Immature Granulocytes: 1 %
Lymphocytes Relative: 22 %
Lymphs Abs: 1.4 10*3/uL (ref 0.7–4.0)
MCH: 29.5 pg (ref 26.0–34.0)
MCHC: 31.9 g/dL (ref 30.0–36.0)
MCV: 92.6 fL (ref 80.0–100.0)
Monocytes Absolute: 0.6 10*3/uL (ref 0.1–1.0)
Monocytes Relative: 10 %
Neutro Abs: 4 10*3/uL (ref 1.7–7.7)
Neutrophils Relative %: 65 %
Platelet Count: 187 10*3/uL (ref 150–400)
RBC: 3.76 MIL/uL — ABNORMAL LOW (ref 3.87–5.11)
RDW: 14.6 % (ref 11.5–15.5)
WBC Count: 6.2 10*3/uL (ref 4.0–10.5)
nRBC: 0 % (ref 0.0–0.2)

## 2021-04-09 NOTE — Progress Notes (Signed)
Donna May   Telephone:([phone]) [phone] Fax:(336) [phone]   Clinic Follow up Note   Patient Care Team: [name], MD as PCP - General (Family Medicine) [name], [name], MD as PCP - Cardiology (Cardiology) [name], [name], MD as Consulting Physician (Cardiology) [name], MD as Consulting Physician (Obstetrics and Gynecology) [name], [name], NP as Nurse Practitioner (Hematology and Oncology) [facility] [name], MD as Consulting Physician (Hematology) [name], [name], MD as Consulting Physician (Gastroenterology) [name], RN as Oncology Nurse Navigator [name], MD as Consulting Physician (General Surgery)  Date of Service:  04/09/2021  CHIEF COMPLAINT: f/u of cholangiocarcinoma  CURRENT THERAPY Pending liver targeted therapy Y90 -Ivoseidenib $RemoveBeforeDE'500mg'keFuWkcJGEAyLVa$  daily, started 02/25/21, will hold for Y90 on 04/16/2021  ASSESSMENT & PLAN:  Donna May is a 81 y.o. female with   1. Intrahepatic cholangiocarcinoma, cT1N0M1, with peritoneal metastasis, MSS, IDH1 mutation (+) -Diagnosed in 09/2018 biopsy of her liver mass shows adenocarcinoma, most consistent with cholangiocarcinoma. 09/29/18 PET scan showed no evidence of distant metastasis. -she was found to have peritoneal metastasis to right diaphragm and surgery was aborted. -Her CT AP from 11/29/18 shows dominate liver mass stable to mild increase, mild nodularity of both adrenal glands which warrants being monitored.  No visible peritoneal metastasis on CT scan. -FO results showed MSI stable disease, IDH mutation positive. Consider IDH 1 inhibitor in the future.   -She began first line oxaliplatin (due to CKD) and gemcitabine every 2 weeks on 11/30/18. Oxali was dose reduced and eventually stopped after cycle 11. She received single agent gemcitabine with cycle 12. Cisplatin added with C13 on 05/17/19 but neuropathy worsened and stopped on 06/14/19. She received gemcitabine  every 2 weeks starting from 06/28/19 C16. She took chemo break from 10/21 - 1/22 -PET from 03/14/20 showed concern for disease progression in liver, she restarted maintenance gemcitabine every 2 weeks from 04/03/20. -I added durvalumab to gemcitabine, due to recurrent disease on PET 09/2020. She tolerated well overall, except persistent neuropathy  -restaging PET on 01/02/21 showed further progression of cancer in liver. I stopped gemcitabine  -she met with IR on 01/30/21 to discuss possible Y90. She was felt to be a good candidate. -CT AP 03/07/21 showed mild cancer progression in liver, no other new mets -she was started on ivosidenib 500 mg daily on 02/25/21 while awaiting Y90. She is tolerating well, aside from possibly some increased neuropathy. We will continue on ivosidenib and monitor her neuropathy.  -she is scheduled for Y90 on 2/17 (left lobe) and 3/17 (right lobe). She had a number of questions about the procedure, despite having previously met with Dr. Pascal Lux for full discussion. I answered her questions to the best of my ability. I advised her to stop the ivosidenib a week before her mapping procedure; we do not plan to start this back until after both procedures, in 06/2021, if her renal function is adequate  -I will reach out to Dr. Pascal Lux about her slightly worse renal function, since she will get iv contrast for mapping and Y90 treatment   2. Symptom Management: taste change -she reports taste change on the ivosidenib. She asked about nutritional supplements. I advised her to try Glucerna, given her DM.   3. Grade 3 CIPN  -secondary to Oxaliplatin, eventually dc'd after cycle 11.  Her diabetes may contribute to it also. -started cisplatin on 05/17/19, neuropathy worsened in her feet with unsteady gait and functional limitations, decreased vibratory sense on tuning fork exam 05/31/19.  Cisplatin was dose reduced then eventually stopped on 06/14/19.  -low efficacy of gabapentin and cymbalta,  also tried lyrica briefly.  She completed PT for balance.  -Follow-up open with Dr. Mickeal Skinner, pt did not find helpful. Also seen by chiropractor -she reports aggravated tingling to her arms and legs on the ivosidenib. We will monitor.   4. H/o right breast cancer, Genetics negative  -s/p mastectomy in 1992, per patient did not require adjuvant therapy -previously followed by Dr. Jana Hakim  -VUS of gene APC; genetics otherwise normal   5. Comorbidities: DM, HTN, hiatal hernia, GERD, renal artery stenosis, CHF -Follow-up with PCP, nephrology, and Dr. Dwyane Dee, and cardiology -stable, well controlled   6. Family support -she has a son and daughter who are supportive. -patient remains independent    7. Goal of care discussion -We frequently discuss the goal of her treatment is palliative and that her cancer is not curable with chemo alone at this stage. The goal is to control her disease, prolong her life, and give her good quality of life. She understands -she is full code now    8. CKD Stage III -continue monitoring, Scr 1.3 - 1.6 -slightly worse lately      PLAN: -continue Ivosidenib 500mg  daily  -discontinue after 04/15/21 due to her upcoming mapping and Y90 treatment  -proceed with Y90 mapping on 2/3, left lobe Y90 on 2/17, and right lobe Y90 on 3/17. -lab, flush, and f/u in 5 weeks   No problem-specific Assessment & Plan notes found for this encounter.   SUMMARY OF ONCOLOGIC HISTORY: Oncology History Overview Note  Cancer Staging Intrahepatic cholangiocarcinoma ([place]) Staging form: Intrahepatic Bile Duct, AJCC 8th Edition - Clinical stage from 09/23/2018: Stage IB (cT1b, cN0, cM0) - Signed by [name], MD on 10/06/2018 Histologic grade (G): G3 Histologic grading system: 3 grade system    Intrahepatic cholangiocarcinoma ([place])  09/07/2018 Imaging   CT Chest IMPRESSION: 1. New, enhancing mass involving segment 4 of the liver and fundus of gallbladder is concerning for  malignancy. This may represent either metastatic disease from breast cancer or neoplasm primary to the liver or hepatic biliary tree. Further evaluation with contrast enhanced CT of the abdomen and pelvis is recommended. 2. No findings to suggest metastatic disease within the chest. 3.  Aortic Atherosclerosis (ICD10-I70.0). 4. Coronary artery calcifications.   09/13/2018 Pathology Results   Diagnosis Liver, needle/core biopsy - ADENOCARCINOMA. Microscopic Comment Immunohistochemistry for CK7 is positive. CK20, TTF1, CDX-2, GATA-3, PAX 8, Qualitative ER, p63 and CK5/6 are negative. The provided clinical history of remote mammary carcinoma is noted. Based on the morphology and immunophenotype of the adenocarcinoma observed in this specimen, primary cholangiocarcinoma is favored. Clinical and radiologic correlation are  encouraged. Results reported to [employer] on 09/15/2018. Intradepartmental consultation (Dr. Vic Ripper).   09/13/2018 Initial Diagnosis   Cholangiocarcinoma ([place])   09/23/2018 Procedure   Colonoscopy by Dr. Havery Moros 09/23/18  IMPRESSION - Two 3 to 4 mm polyps in the ascending colon, removed with a cold snare. Resected and retrieved. - Five 3 to 5 mm polyps in the transverse colon, removed with a cold snare. Resected and retrieved. - One 5 mm polyp at the splenic flexure, removed with a cold snare. Resected and retrieved. - Three 3 to 5 mm polyps in the sigmoid colon, removed with a cold snare. Resected and retrieved. - The examination was otherwise normal. Upper Endopscy by Dr. Havery Moros 09/23/18  IMPRESSION - Esophagogastric landmarks identified. - 2 cm hiatal hernia. - Normal esophagus otherwise. -  A single gastric polyp. Resected and retrieved. - Mild gastritis. Biopsied. - Normal duodenal bulb and second portion of the duodenum.   09/23/2018 Pathology Results   Diagnosis 09/23/18 1. Surgical [P], duodenum - BENIGN SMALL BOWEL MUCOSA. - NO ACTIVE INFLAMMATION OR  VILLOUS ATROPHY IDENTIFIED. 2. Surgical [P], stomach, polyp - HYPERPLASTIC POLYP(S). - THERE IS NO EVIDENCE OF MALIGNANCY. 3. Surgical [P], gastric antrum and gastric body - CHRONIC INACTIVE GASTRITIS. - THERE IS NO EVIDENCE OF HELICOBACTER-PYLORI, DYSPLASIA, OR MALIGNANCY. - SEE COMMENT. 4. Surgical [P], colon, sigmoid, splenic flexure, transverse and ascending, polyp (9) - TUBULAR ADENOMA(S). - SESSILE SERRATED POLYP WITHOUT CYTOLOGIC DYSPLASIA. - HIGH GRADE DYSPLASIA IS NOT IDENTIFIED. 5. Surgical [P], colon, sigmoid, polyp (2) - HYPERPLASTIC POLYP(S). - THERE IS NO EVIDENCE OF MALIGNANCY.   09/23/2018 Cancer Staging   Staging form: Intrahepatic Bile Duct, AJCC 8th Edition - Clinical stage from 09/23/2018: Stage IB (cT1b, cN0, cM0) - Signed by [name], MD on 10/06/2018    09/29/2018 PET scan   PET 09/29/18 IMPRESSION: 1. Hypermetabolic mass in the RIGHT hepatic lobe consistent with biopsy proven adenocarcinoma. No additional liver metastasis. 2. No evidence of local breast cancer recurrence in the RIGHT breast or RIGHT axilla. 3. Mild bilateral hypermetabolic adrenal glands is favored benign hyperplasia. 4. No evidence of additional metastatic disease on skull base to thigh FDG PET scan.   11/06/2018 Genetic Testing   Negative genetic testing on the [facility] Common Hereditary Cancers panel. A variant of uncertain significance was identified in one of her APC genes, called c.1243G>A (p.Ala415Thr).  The Common Hereditary Cancers Panel offered by [facility] includes sequencing and/or deletion duplication testing of the following 48 genes: APC, ATM, AXIN2, BARD1, BMPR1A, BRCA1, BRCA2, BRIP1, CDH1, CDK4, CDKN2A (p14ARF), CDKN2A (p16INK4a), CHEK2, CTNNA1, DICER1, EPCAM (Deletion/duplication testing only), GREM1 (promoter region deletion/duplication testing only), KIT, MEN1, MLH1, MSH2, MSH3, MSH6, MUTYH, NBN, NF1, NHTL1, PALB2, PDGFRA, PMS2, POLD1, POLE, PTEN, RAD50, RAD51C, RAD51D, RNF43,  SDHB, SDHC, SDHD, SMAD4, SMARCA4. STK11, TP53, TSC1, TSC2, and VHL.  The following genes were evaluated for sequence changes only: SDHA and HOXB13 c.251G>A variant only.    11/17/2018 Pathology Results   Diagnosis 11/17/18 1. Soft tissue, biopsy, Diaphragmatic nodules - METASTATIC ADENOCARCINOMA, CONSISTENT WITH PATIENT'S CLINICAL HISTORY OF CHOLANGIOCARCINOMA. SEE NOTE 2. Liver, biopsy, Left - LIVER PARENCHYMA WITH A BENIGN FIBROTIC NODULE - NO EVIDENCE OF MALIGNANCY 3. Stomach, biopsy - BENIGN PAPILLARY MESOTHELIAL HYPERPLASIA - NO EVIDENCE OF MALIGNANCY   11/29/2018 Imaging   CT CAP WO Contrast  IMPRESSION: 1. Dominant liver mass appears grossly stable from 09/29/2018. Additional liver lesions are too small to characterize but were not shown to be hypermetabolic on PET. 2. Mild nodularity of both adrenal glands with associated hypermetabolism on 09/29/2018. Continued attention on follow-up exams is warranted. 3. Small right lower lobe nodules, stable from 09/07/2018. Again, attention on follow-up is recommended. 4. Trace bilateral pleural fluid. 5. Aortic atherosclerosis (ICD10-170.0). Coronary artery calcification. 6. Enlarged pulmonic trunk, indicative of pulmonary arterial hypertension.     11/30/2018 - 06/14/2019 Chemotherapy   First line chemo Oxaliplatin and gemcitabine q2weeks starting 11/30/18. Stopped Oxaliplatin on 05/03/19 due to worsening neuropathy. Added Cisplatin with C13 on 05/17/19 and stopped on C15 06/14/19 due to neuropathy. Reduced to maintenance single agent Gemcitabine on 06/28/19.    01/20/2019 Imaging   CT CAP IMPRESSION: restaging  1. Mild interval increase in size of dominant liver mass involving segment 4 and segment 5. 2. No significant or progressive adrenal nodularity identified to  suggest metastatic disease. 3. Unchanged appearance of small right lower lobe and lingular lung nodules, nonspecific.   03/21/2019 PET scan   IMPRESSION: 1. Large right  hepatic mass with SUV uptake near background hepatic activity, dramatic response to therapy, also with decrease in size when compared to the prior study. 2. Signs of prior right mastectomy and axillary dissection as before. 3. Left adrenal activity in no longer above the level of activity seen in the contralateral, right adrenal gland or in the liver. Attention on follow-up. 4. No new signs of disease.   06/27/2019 PET scan   IMPRESSION: 1. Further decrease in size of a dominant hepatic mass which is non FDG avid. 2. No evidence of metastatic disease. 3. No evidence of left adrenal hypermetabolism or mass. 4. Incidental findings, including: Uterine fibroids. Coronary artery atherosclerosis. Aortic Atherosclerosis (ICD10-I70.0). Pulmonary artery enlargement suggests pulmonary arterial hypertension.   06/28/2019 -  Chemotherapy   Maintenance single agent Gemcitabine q2weeks starting on 06/28/19 with C16.  ------On chemo break since 12/27/19.  ------She restarted Single Agent Gemcitabine q2weeks on 04/03/20 due to mild disease progression in liver on 02/2020 PET    09/15/2019 PET scan   IMPRESSION: 1. In the region of regional tumor at the junction of the right and left hepatic lobe, there is continued hypodensity and overall activity slightly less than that of the surrounding normal liver, indicating effective response to therapy. No current worrisome hypermetabolic lesion is identified in the liver or elsewhere. 2. Chronic asymmetric edema in the subcutaneous tissues of the right upper extremity, nonspecific. The patient has had a prior right mastectomy and right axillary dissection. 3. Other imaging findings of potential clinical significance: Aortic Atherosclerosis (ICD10-I70.0). Coronary atherosclerosis. Mild cardiomegaly. Small right and trace left pleural effusions. Subcutaneous and mild mesenteric edema. Suspected uterine fibroids.   12/18/2019 PET scan   IMPRESSION: 1. No  significant abnormal activity to suggest active/recurrent malignancy. 2. Lingual tonsillar activity is mildly increased from prior but probably incidental/physiologic, attention on follow up suggested. 3. Other imaging findings of potential clinical significance: Third spacing of fluid with trace ascites, mesenteric edema, subcutaneous edema, and possibly subtle pulmonary edema. Mild cardiomegaly. Aortic Atherosclerosis (ICD10-I70.0). Coronary atherosclerosis. Right ovarian dermoid. Calcified uterine fibroids. Renal cysts. Right glenohumeral arthropathy. Mild to moderate scoliosis. Suspected pulmonary arterial hypertension.   03/14/2020 PET scan   IMPRESSION: 1. New small focus of hypermetabolism in segment 4A of the liver worrisome for new metastatic focus. 2. No other findings for metastatic disease involving the neck, chest, abdomen or pelvis.     06/11/2020 PET scan   IMPRESSION: 1. No suspicious hypermetabolic activity within the neck, chest, abdomen or pelvis. 2. The treated dominant peripheral cholangiocarcinoma appears slightly enlarged compared with recent prior studies, and there is a questionable new lesion more superiorly in the left lobe. Although without associated hypermetabolic activity, these lesions are suspicious and would be better assessed with abdominal MRI without and with contrast.   09/17/2020 PET scan   IMPRESSION: 1. Recurrent hypermetabolic activity around the periphery of the dominant hepatic mass consistent with local recurrence. This mass appears slightly larger. Possible small metastasis in the dome of the left hepatic lobe is unchanged in size, without hypermetabolic activity. 2. No distant metastases identified. 3. Stable incidental findings as detailed above.   10/16/2020 -  Chemotherapy   Patient is on Treatment Plan : BLADDER Durvalumab q28d     01/02/2021 PET scan   IMPRESSION: 1. Further enlargement of hypermetabolic hepatic mass superior to the  cholecystectomy bed consistent with progressive local tumor recurrence. Satellite lesion more superiorly in the anterior dome of the liver has also enlarged, without definite hypermetabolic activity. 2. No distant metastases identified. 3. Stable incidental findings including aortic atherosclerosis, hepatic and renal cysts and uterine fibroids.      INTERVAL HISTORY:  Donna May is here for a follow up of cholangiocarcinoma. She was last seen by me on 03/20/21. She presents to the clinic alone. She feels her neuropathy has worsened on the Ivosidenib. She notes she is still able to do things, but she reports the medicine aggravates the tingling feeling. She described the tingling as spreading up her arms and legs.   All other systems were reviewed with the patient and are negative.  MEDICAL HISTORY:  Past Medical History:  Diagnosis Date   Anemia    Anxiety    Arthritis    Breast cancer ([place]) 1992   right   Cataract    Bilateral eyes - surgery to remove   Chronic diastolic CHF (congestive heart failure) (H[facility])    Chronic kidney disease    CKD stage 3   Complication of anesthesia    "something they use make me itch for a couple of days."   Depression    Diabetes mellitus    type 2   Duodenitis 01/18/2002   Fainting spell    GERD (gastroesophageal reflux disease)    Heart murmur    never has caused any problems   Hiatal hernia 08/08/2008, 01/18/2002   History of pneumonia    x 2   Hyperlipidemia    Hypertension    Liver cancer ([place]) 08/2018   chemotherapy   Lymphedema 2017   Right arm   Peripheral neuropathy     SURGICAL HISTORY: Past Surgical History:  Procedure Laterality Date   AXILLARY SURGERY     cyst removal, right   COLONOSCOPY  09/23/2018   Dr. Havery Moros - polyps   EYE SURGERY Bilateral    cataracts to remove   IR RADIOLOGIST EVAL & MGMT  01/30/2021   LAPAROSCOPY N/A 11/17/2018   Procedure: LAPAROSCOPY DIAGNOSTIC, INTRAOPERATIVE ULTRASOUND, PERITONEAL  BIOPSIES;  Surgeon: [name], MD;  Location: [facility];  Service: General;  Laterality: N/A;  GENERAL AND EPIDURAL   LIVER BIOPSY  08/2018   Dr. Lindwood Coke   MASTECTOMY  1992   right, with flap   NM [place]  06/11/2009   Protocol:[place], post stress EF58%, EKG negative for ischemia, low risk   PORTACATH PLACEMENT N/A 12/02/2018   Procedure: INSERTION PORT-A-CATH;  Surgeon: [name], MD;  Location: [facility];  Service: General;  Laterality: N/A;   RECONSTRUCTION BREAST W/ TRAM FLAP Right    TONSILLECTOMY  1958   TRANSTHORACIC ECHOCARDIOGRAM  12/24/2009   LVEF =>55%, normal study   UPPER GI ENDOSCOPY      I have reviewed the social history and family history with the patient and they are unchanged from previous note.  ALLERGIES:  has No Known Allergies.  MEDICATIONS:  Current Outpatient Medications  Medication Sig Dispense Refill   amLODipine (NORVASC) 10 MG tablet Take 1 tablet (10 mg total) by mouth daily. 90 tablet 1   aspirin 81 MG tablet Take 81 mg by mouth daily.      Baclofen 5 MG TABS TAKE 1/2 TABLET BY MOUTH ONCE DAILY AS NEEDED 42 tablet 1   carvedilol (COREG) 25 MG tablet TAKE 1 TABLET TWICE DAILY  WITH MEALS 180 tablet 3   furosemide (  LASIX) 40 MG tablet Take 1 tablet (40 mg total) by mouth daily. 90 tablet 1   gabapentin (NEURONTIN) 100 MG capsule Take 1 capsule (100 mg total) by mouth every 8 (eight) hours as needed. 180 capsule 2   glucose blood test strip 1 each by Other route 2 (two) times daily. Use Onetouch verio test strips as instructed to check blood sugar twice daily. 100 each 2   hydrALAZINE (APRESOLINE) 100 MG tablet TAKE THREE TIMES DAILY-AM, MIDDLE OF THE DAY AND IN THE EVENING. 270 tablet 3   Ivosidenib (TIBSOVO) 250 MG TABS Take 2 tablets ($RemoveBe'500mg'WVjAnhOkO$ ) by mouth daily. 60 tablet 1   lansoprazole (PREVACID) 15 MG capsule Take 1 capsule (15 mg total) by mouth daily at 12 noon. Take 1 capsule by mouth once or twice a day as needed 90 capsule 3    lidocaine-prilocaine (EMLA) cream Apply 1 application topically as needed. 30 g 1   loratadine (CLARITIN) 10 MG tablet Take 10 mg by mouth daily as needed for allergies.     Omega 3 1000 MG CAPS Take 2,000 mg by mouth daily.      polyethylene glycol (MIRALAX / GLYCOLAX) 17 g packet Take 17 g by mouth daily as needed. 14 each 0   potassium chloride (KLOR-CON M10) 10 MEQ tablet Take 1 tablet (10 mEq total) by mouth daily. 90 tablet 1   prochlorperazine (COMPAZINE) 10 MG tablet Take 1 tablet (10 mg total) by mouth every 6 (six) hours as needed for nausea or vomiting. 30 tablet 3   rosuvastatin (CRESTOR) 20 MG tablet Take 1 tablet (20 mg total) by mouth daily. 90 tablet 3   sitaGLIPtin (JANUVIA) 100 MG tablet Take 1 tablet (100 mg total) by mouth daily. 90 tablet 3   valsartan (DIOVAN) 320 MG tablet Take 1 tablet (320 mg total) by mouth daily. 90 tablet 3   Current Facility-Administered Medications  Medication Dose Route Frequency Provider Last Rate Last Admin   triamcinolone acetonide (KENALOG) 10 MG/ML injection 10 mg  10 mg Other Once Harriet Masson, DPM        PHYSICAL EXAMINATION: ECOG PERFORMANCE STATUS: 2 - Symptomatic, <50% confined to bed  Vitals:   04/09/21 0938  BP: (!) 156/56  Pulse: 62  Resp: 18  Temp: 98.6 F (37 C)  SpO2: 100%   Wt Readings from Last 3 Encounters:  04/09/21 129 lb 8 oz (58.7 kg)  03/28/21 129 lb 6.4 oz (58.7 kg)  03/20/21 131 lb 11.2 oz (59.7 kg)     GENERAL:alert, no distress and comfortable SKIN: skin color normal, no rashes or significant lesions EYES: normal, Conjunctiva are pink and non-injected, sclera clear  NEURO: alert & oriented x 3 with fluent speech  LABORATORY DATA:  I have reviewed the data as listed CBC Latest Ref Rng & Units 04/09/2021 03/20/2021 03/05/2021  WBC 4.0 - 10.5 K/uL 6.2 5.7 6.0  Hemoglobin 12.0 - 15.0 g/dL 11.1(L) 11.1(L) 10.8(L)  Hematocrit 36.0 - 46.0 % 34.8(L) 34.3(L) 34.7(L)  Platelets 150 - 400 K/uL 187 190 216      CMP Latest Ref Rng & Units 04/09/2021 03/28/2021 03/20/2021  Glucose 70 - 99 mg/dL 96 - 105(H)  BUN 8 - 23 mg/dL 49(H) - 40(H)  Creatinine 0.44 - 1.00 mg/dL 1.79(H) - 1.61(H)  Sodium 135 - 145 mmol/L 143 - 140  Potassium 3.5 - 5.1 mmol/L 4.2 - 4.2  Chloride 98 - 111 mmol/L 109 - 104  CO2 22 - 32 mmol/L 27 - 29  Calcium 8.9 - 10.3 mg/dL 9.1 - 9.0  Total Protein 6.5 - 8.1 g/dL 7.2 7.0 7.0  Total Bilirubin 0.3 - 1.2 mg/dL 0.3 <0.2 0.3  Alkaline Phos 38 - 126 U/L 79 106 81  AST 15 - 41 U/L $Remo'19 23 18  'dGZBb$ ALT 0 - 44 U/L $Remo'7 9 6      'YTdMC$ RADIOGRAPHIC STUDIES: I have personally reviewed the radiological images as listed and agreed with the findings in the report. No results found.    No orders of the defined types were placed in this encounter.  All questions were answered. The patient knows to call the clinic with any problems, questions or concerns. No barriers to learning was detected. The total time spent in the appointment was 30 minutes.     [name], MD 04/09/2021   I, Wilburn Mylar, am acting as scribe for [name], MD.   I have reviewed the above documentation for accuracy and completeness, and I agree with the above.

## 2021-04-11 ENCOUNTER — Telehealth: Payer: Self-pay | Admitting: Cardiovascular Disease

## 2021-04-11 NOTE — Telephone Encounter (Signed)
°*  STAT* If patient is at the pharmacy, call can be transferred to refill team.   1. Which medications need to be refilled? (please list name of each medication and dose if known)  carvedilol (COREG) 25 MG tablet  2. Which pharmacy/location (including street and city if local pharmacy) is medication to be sent to? [id number] - [place], [place] - [address] AT [place]    3. Do they need a 30 day or 90 day supply? 90 with refills   Patient would like to get this medication from her local pharmacy from now on. Sometimes the mail order service takes too long and she does not want to run out

## 2021-04-15 ENCOUNTER — Other Ambulatory Visit: Payer: Self-pay | Admitting: Radiology

## 2021-04-16 MED ORDER — CARVEDILOL 25 MG PO TABS: 25.0000 mg | ORAL_TABLET | Freq: Two times a day (BID) | ORAL | 3 refills | Status: AC

## 2021-04-16 NOTE — Telephone Encounter (Signed)
Refills has been sent to the local pharmacy.

## 2021-04-16 NOTE — Telephone Encounter (Signed)
As previously stated in this refill request the patient needs this prescription [facility] [address] [facility] on [facility]. The patient is almost out of medication and is upset that this was not handled as she requested on 01/27. She no longer wants to receive this prescription through mail service. Please send refill for a 90 day supply to the pharmacy listed in the refill request.

## 2021-04-17 ENCOUNTER — Other Ambulatory Visit: Payer: Self-pay | Admitting: Student

## 2021-04-18 ENCOUNTER — Ambulatory Visit (HOSPITAL_COMMUNITY)
Admission: RE | Admit: 2021-04-18 | Discharge: 2021-04-18 | Disposition: A | Discharge status: Home / Self Care | Payer: Medicare Other | Source: Ambulatory Visit | Attending: Interventional Radiology | Admitting: Interventional Radiology

## 2021-04-18 ENCOUNTER — Encounter (HOSPITAL_COMMUNITY)
Admission: RE | Admit: 2021-04-18 | Discharge: 2021-04-18 | Disposition: A | Discharge status: Home / Self Care | Payer: Medicare Other | Source: Ambulatory Visit | Attending: Interventional Radiology | Admitting: Interventional Radiology

## 2021-04-18 ENCOUNTER — Other Ambulatory Visit (HOSPITAL_COMMUNITY): Payer: Self-pay | Admitting: Interventional Radiology

## 2021-04-18 ENCOUNTER — Other Ambulatory Visit: Payer: Self-pay

## 2021-04-18 ENCOUNTER — Encounter (HOSPITAL_COMMUNITY): Payer: Self-pay

## 2021-04-18 DIAGNOSIS — I130 Hypertensive heart and chronic kidney disease with heart failure and stage 1 through stage 4 chronic kidney disease, or unspecified chronic kidney disease: Secondary | ICD-10-CM | POA: Insufficient documentation

## 2021-04-18 DIAGNOSIS — I5032 Chronic diastolic (congestive) heart failure: Secondary | ICD-10-CM | POA: Diagnosis not present

## 2021-04-18 DIAGNOSIS — E1122 Type 2 diabetes mellitus with diabetic chronic kidney disease: Secondary | ICD-10-CM | POA: Insufficient documentation

## 2021-04-18 DIAGNOSIS — F32A Depression, unspecified: Secondary | ICD-10-CM | POA: Insufficient documentation

## 2021-04-18 DIAGNOSIS — N183 Chronic kidney disease, stage 3 unspecified: Secondary | ICD-10-CM | POA: Diagnosis not present

## 2021-04-18 DIAGNOSIS — Z7984 Long term (current) use of oral hypoglycemic drugs: Secondary | ICD-10-CM | POA: Diagnosis not present

## 2021-04-18 DIAGNOSIS — E785 Hyperlipidemia, unspecified: Secondary | ICD-10-CM | POA: Diagnosis not present

## 2021-04-18 DIAGNOSIS — C786 Secondary malignant neoplasm of retroperitoneum and peritoneum: Secondary | ICD-10-CM | POA: Diagnosis not present

## 2021-04-18 DIAGNOSIS — C221 Intrahepatic bile duct carcinoma: Secondary | ICD-10-CM

## 2021-04-18 DIAGNOSIS — Z853 Personal history of malignant neoplasm of breast: Secondary | ICD-10-CM | POA: Insufficient documentation

## 2021-04-18 DIAGNOSIS — E1140 Type 2 diabetes mellitus with diabetic neuropathy, unspecified: Secondary | ICD-10-CM | POA: Insufficient documentation

## 2021-04-18 DIAGNOSIS — K219 Gastro-esophageal reflux disease without esophagitis: Secondary | ICD-10-CM | POA: Diagnosis not present

## 2021-04-18 HISTORY — PX: IR ANGIOGRAM SELECTIVE EACH ADDITIONAL VESSEL: IMG667

## 2021-04-18 HISTORY — PX: IR US GUIDE VASC ACCESS RIGHT: IMG2390

## 2021-04-18 HISTORY — PX: IR ANGIOGRAM VISCERAL SELECTIVE: IMG657

## 2021-04-18 LAB — COMPREHENSIVE METABOLIC PANEL
ALT: 10 U/L (ref 0–44)
AST: 19 U/L (ref 15–41)
Albumin: 3.6 g/dL (ref 3.5–5.0)
Alkaline Phosphatase: 74 U/L (ref 38–126)
Anion gap: 8 (ref 5–15)
BUN: 55 mg/dL — ABNORMAL HIGH (ref 8–23)
CO2: 23 mmol/L (ref 22–32)
Calcium: 8.9 mg/dL (ref 8.9–10.3)
Chloride: 106 mmol/L (ref 98–111)
Creatinine, Ser: 1.91 mg/dL — ABNORMAL HIGH (ref 0.44–1.00)
GFR, Estimated: 26 mL/min — ABNORMAL LOW (ref >60)
Glucose, Bld: 89 mg/dL (ref 70–99)
Potassium: 4.4 mmol/L (ref 3.5–5.1)
Sodium: 137 mmol/L (ref 135–145)
Total Bilirubin: 0.5 mg/dL (ref 0.3–1.2)
Total Protein: 7.2 g/dL (ref 6.5–8.1)

## 2021-04-18 LAB — CBC
HCT: 34.9 % — ABNORMAL LOW (ref 36.0–46.0)
Hemoglobin: 11.2 g/dL — ABNORMAL LOW (ref 12.0–15.0)
MCH: 30 pg (ref 26.0–34.0)
MCHC: 32.1 g/dL (ref 30.0–36.0)
MCV: 93.6 fL (ref 80.0–100.0)
Platelets: 205 10*3/uL (ref 150–400)
RBC: 3.73 MIL/uL — ABNORMAL LOW (ref 3.87–5.11)
RDW: 14.9 % (ref 11.5–15.5)
WBC: 5.9 10*3/uL (ref 4.0–10.5)
nRBC: 0 % (ref 0.0–0.2)

## 2021-04-18 LAB — PROTIME-INR
INR: 1.2 (ref 0.8–1.2)
Prothrombin Time: 14.8 seconds (ref 11.4–15.2)

## 2021-04-18 LAB — GLUCOSE, CAPILLARY: Glucose-Capillary: 74 mg/dL (ref 70–99)

## 2021-04-18 MED ORDER — LIDOCAINE HCL (PF) 1 % IJ SOLN
INTRAMUSCULAR | Status: AC | PRN
  Administered 2021-04-18: 10 mL via INTRADERMAL

## 2021-04-18 MED ORDER — IOHEXOL 300 MG/ML  SOLN
100.0000 mL | Freq: Once | INTRAMUSCULAR | Status: AC | PRN
  Administered 2021-04-18: 20 mL via INTRA_ARTERIAL

## 2021-04-18 MED ORDER — LIDOCAINE HCL 1 % IJ SOLN
INTRAMUSCULAR | Status: AC
  Filled 2021-04-18: qty 20

## 2021-04-18 MED ORDER — MIDAZOLAM HCL 2 MG/2ML IJ SOLN
INTRAMUSCULAR | Status: AC | PRN
  Administered 2021-04-18: .5 mg via INTRAVENOUS

## 2021-04-18 MED ORDER — FENTANYL CITRATE (PF) 100 MCG/2ML IJ SOLN
INTRAMUSCULAR | Status: AC | PRN
  Administered 2021-04-18: 25 ug via INTRAVENOUS

## 2021-04-18 MED ORDER — DEXTROSE-NACL 5-0.9 % IV SOLN: INTRAVENOUS | Status: DC

## 2021-04-18 MED ORDER — FENTANYL CITRATE (PF) 100 MCG/2ML IJ SOLN
INTRAMUSCULAR | Status: AC | PRN
  Administered 2021-04-18: 50 ug via INTRAVENOUS

## 2021-04-18 MED ORDER — MIDAZOLAM HCL 2 MG/2ML IJ SOLN
INTRAMUSCULAR | Status: AC | PRN
  Administered 2021-04-18: 1 mg via INTRAVENOUS

## 2021-04-18 MED ORDER — FENTANYL CITRATE (PF) 100 MCG/2ML IJ SOLN
INTRAMUSCULAR | Status: AC
  Filled 2021-04-18: qty 2

## 2021-04-18 MED ORDER — TECHNETIUM TO 99M ALBUMIN AGGREGATED
4.4000 | Freq: Once | INTRAVENOUS | Status: AC | PRN
  Administered 2021-04-18: 4.4 via INTRAVENOUS

## 2021-04-18 MED ORDER — MIDAZOLAM HCL 2 MG/2ML IJ SOLN
INTRAMUSCULAR | Status: AC
  Filled 2021-04-18: qty 4

## 2021-04-18 MED ORDER — HEPARIN SOD (PORK) LOCK FLUSH 100 UNIT/ML IV SOLN
500.0000 [IU] | INTRAVENOUS | Status: AC | PRN
  Administered 2021-04-18: 500 [IU]
  Filled 2021-04-18: qty 5

## 2021-04-18 MED ORDER — LIDOCAINE HCL (PF) 1 % IJ SOLN
INTRAMUSCULAR | Status: AC | PRN
  Administered 2021-04-18: 5 mL via INTRADERMAL

## 2021-04-18 MED ORDER — IOHEXOL 300 MG/ML  SOLN
100.0000 mL | Freq: Once | INTRAMUSCULAR | Status: AC | PRN
Start: 2021-04-18 — End: 2021-04-18
  Administered 2021-04-18: 20 mL via INTRA_ARTERIAL

## 2021-04-18 MED ORDER — SODIUM CHLORIDE 0.9 % IV SOLN: INTRAVENOUS | Status: DC

## 2021-04-18 NOTE — H&P (Signed)
Referring Physician(s): Feng,Y  Supervising Physician: Sandi Mariscal  Patient Status:  [facility]  Chief Complaint:  Intrahepatic cholangiocarcinoma  Subjective: Patient familiar to IR service from liver lesion biopsy in 2020 and consultation with Dr. Pascal Lux on 01/30/2021 to discuss treatment options for intrahepatic cholangiocarcinoma.  Her past medical history is also significant for anemia, anxiety, arthritis, remote breast cancer, CHF, chronic kidney disease, depression, diabetes, GERD, hyperlipidemia, hypertension, peripheral neuropathy.  He was diagnosed with cholangiocarcinoma on liver biopsy in 2020.  She initially underwent attempted surgical resection however was found to have peritoneal mets at the time of surgery and the surgery was aborted.Since that time, she has undergone several rounds of systemic therapy, most recently gemcitabine with durvalumab, which overall she tolerated well except for persistent neuropathy primarily affecting her hands and lower legs.PET/CT performed 01/02/2021 demonstrated progression of disease, though fortunately, the disease appears limited to the liver .  Following discussions with Dr. Pascal Lux she was deemed an appropriate candidate for Y 90 hepatic radioembolization and presents today for arterial roadmapping study with possible embolization and test Y 90 dosing.  She currently denies fever, headache, chest pain, dyspnea, cough, worsening abdominal pain or bleeding.  She does have lower back discomfort with some radiculopathy/neuropathy.  Past Medical History:  Diagnosis Date   Anemia    Anxiety    Arthritis    Breast cancer ([place]) 1992   right   Cataract    Bilateral eyes - surgery to remove   Chronic diastolic CHF (congestive heart failure) (HCC)    Chronic kidney disease    CKD stage 3   Complication of anesthesia    "something they use make me itch for a couple of days."   Depression    Diabetes mellitus    type 2   Duodenitis 01/18/2002    Fainting spell    GERD (gastroesophageal reflux disease)    Heart murmur    never has caused any problems   Hiatal hernia 08/08/2008, 01/18/2002   History of pneumonia    x 2   Hyperlipidemia    Hypertension    Liver cancer ([place]) 08/2018   chemotherapy   Lymphedema 2017   Right arm   Peripheral neuropathy    Past Surgical History:  Procedure Laterality Date   AXILLARY SURGERY     cyst removal, right   COLONOSCOPY  09/23/2018   Dr. Havery Moros - polyps   EYE SURGERY Bilateral    cataracts to remove   IR RADIOLOGIST EVAL & MGMT  01/30/2021   LAPAROSCOPY N/A 11/17/2018   Procedure: LAPAROSCOPY DIAGNOSTIC, INTRAOPERATIVE ULTRASOUND, PERITONEAL BIOPSIES;  Surgeon: [name], MD;  Location: [facility];  Service: General;  Laterality: N/A;  GENERAL AND EPIDURAL   LIVER BIOPSY  08/2018   Dr. Lindwood Coke   MASTECTOMY  1992   right, with flap   NM [place]  06/11/2009   Protocol:Bruce, post stress EF58%, EKG negative for ischemia, low risk   PORTACATH PLACEMENT N/A 12/02/2018   Procedure: INSERTION PORT-A-CATH;  Surgeon: [name], MD;  Location: [facility];  Service: General;  Laterality: N/A;   RECONSTRUCTION BREAST W/ TRAM FLAP Right    TONSILLECTOMY  1958   TRANSTHORACIC ECHOCARDIOGRAM  12/24/2009   LVEF =>55%, normal study   UPPER GI ENDOSCOPY        Allergies: Patient has no known allergies.  Medications: Prior to Admission medications   Medication Sig Start Date End Date Taking? Authorizing Provider  amLODipine (NORVASC) 10 MG tablet  Take 1 tablet (10 mg total) by mouth daily. 03/28/21  Yes Cleaver, Jossie Ng, NP  carvedilol (COREG) 25 MG tablet Take 1 tablet (25 mg total) by mouth 2 (two) times daily with a meal. 04/16/21  Yes [name], Mihai, MD  furosemide (LASIX) 40 MG tablet Take 1 tablet (40 mg total) by mouth daily. 03/28/21  Yes Cleaver, Jossie Ng, NP  gabapentin (NEURONTIN) 100 MG capsule Take 1 capsule (100 mg total) by mouth every 8 (eight) hours as needed.  05/15/20  Yes [name], MD  glucose blood test strip 1 each by Other route 2 (two) times daily. Use Onetouch verio test strips as instructed to check blood sugar twice daily. 01/05/19  Yes Elayne Snare, MD  hydrALAZINE (APRESOLINE) 100 MG tablet TAKE THREE TIMES DAILY-AM, MIDDLE OF THE DAY AND IN THE EVENING. 09/13/20  Yes [name], Mihai, MD  Ivosidenib (TIBSOVO) 250 MG TABS Take 2 tablets (500mg ) by mouth daily. 04/04/21  Yes [name], MD  lansoprazole (PREVACID) 15 MG capsule Take 1 capsule (15 mg total) by mouth daily at 12 noon. Take 1 capsule by mouth once or twice a day as needed 11/01/20  Yes [name], [name], MD  lidocaine-prilocaine (EMLA) cream Apply 1 application topically as needed. 08/21/20  Yes [name], MD  loratadine (CLARITIN) 10 MG tablet Take 10 mg by mouth daily as needed for allergies.   Yes [provider]  Omega 3 1000 MG CAPS Take 2,000 mg by mouth daily.    Yes [provider]  polyethylene glycol (MIRALAX / GLYCOLAX) 17 g packet Take 17 g by mouth daily as needed. 11/01/20  Yes [name], [name], MD  potassium chloride (KLOR-CON M10) 10 MEQ tablet Take 1 tablet (10 mEq total) by mouth daily. 03/28/21  Yes Cleaver, Jossie Ng, NP  prochlorperazine (COMPAZINE) 10 MG tablet Take 1 tablet (10 mg total) by mouth every 6 (six) hours as needed for nausea or vomiting. 03/25/21  Yes [name], MD  rosuvastatin (CRESTOR) 20 MG tablet Take 1 tablet (20 mg total) by mouth daily. 04/02/21 07/01/21 Yes Cleaver, Jossie Ng, NP  sitaGLIPtin (JANUVIA) 100 MG tablet Take 1 tablet (100 mg total) by mouth daily. 04/08/20  Yes [name], MD  valsartan (DIOVAN) 320 MG tablet Take 1 tablet (320 mg total) by mouth daily. 02/07/20  Yes Deberah Pelton, NP  aspirin 81 MG tablet Take 81 mg by mouth daily.     [provider]  Baclofen 5 MG TABS TAKE 1/2 TABLET BY MOUTH ONCE DAILY AS NEEDED 12/20/20   [name], MD     Vital Signs: BP (!) 164/58    Pulse (!) 58    Temp 98.6  F (37 C) (Oral)    Resp 18    LMP  (LMP Unknown)    SpO2 100%   Physical Exam awake, alert.  Chest clear to auscultation bilaterally.  Heart with regular rate and rhythm.  Abdomen soft, positive bowel sounds, nontender.  No lower extremity edema.  Imaging: No results found.  Labs:  CBC: Recent Labs    03/05/21 0926 03/20/21 0943 04/09/21 0831 04/18/21 0825  WBC 6.0 5.7 6.2 5.9  HGB 10.8* 11.1* 11.1* 11.2*  HCT 34.7* 34.3* 34.8* 34.9*  PLT 216 190 187 205    COAGS: No results for input(s): INR, APTT in the last 8760 hours.  BMP: Recent Labs    02/25/21 0943 03/05/21 0926 03/20/21 0943 04/09/21 0831  NA 139 142 140 143  K 4.2 4.0 4.2 4.2  CL 106 108 104 109  CO2 26 26 29 27   GLUCOSE 76 133* 105* 96  BUN 32* 44* 40* 49*  CALCIUM 8.8* 9.0 9.0 9.1  CREATININE 1.30* 1.51* 1.61* 1.79*  GFRNONAA 41* 34* 32* 28*    LIVER FUNCTION TESTS: Recent Labs    03/05/21 0926 03/20/21 0943 03/28/21 0842 04/09/21 0831  BILITOT 0.3 0.3 <0.2 0.3  AST 17 18 23 19   ALT 10 6 9 7   ALKPHOS 76 81 106 79  PROT 7.0 7.0 7.0 7.2  ALBUMIN 3.8 3.7 4.1 3.7    Assessment and Plan: Patient familiar to IR service from liver lesion biopsy in 2020 and consultation with Dr. Pascal Lux on 01/30/2021 to discuss treatment options for intrahepatic cholangiocarcinoma.  Her past medical history is also significant for anemia, anxiety, arthritis, remote breast cancer, CHF, chronic kidney disease, depression, diabetes, GERD, hyperlipidemia, hypertension, peripheral neuropathy.  He was diagnosed with cholangiocarcinoma on liver biopsy in 2020.  She initially underwent attempted surgical resection however was found to have peritoneal mets at the time of surgery and the surgery was aborted.Since that time, she has undergone several rounds of systemic therapy, most recently gemcitabine with durvalumab, which overall she tolerated well except for persistent neuropathy primarily affecting her hands and lower  legs.PET/CT performed 01/02/2021 demonstrated progression of disease, though fortunately, the disease appears limited to the liver .  Following discussions with Dr. Pascal Lux she was deemed an appropriate candidate for Y 90 hepatic radioembolization and presents today for arterial roadmapping study with possible embolization and test Y 90 dosing. Risks and benefits of procedure were discussed with the patient including, but not limited to bleeding, infection, vascular injury or contrast induced renal failure.  This interventional procedure involves the use of X-rays and because of the nature of the planned procedure, it is possible that we will have prolonged use of X-ray fluoroscopy.  Potential radiation risks to you include (but are not limited to) the following: - A slightly elevated risk for cancer  several years later in life. This risk is typically less than 0.5% percent. This risk is low in comparison to the normal incidence of human cancer, which is 33% for women and 50% for men according to the [employer]. - Radiation induced injury can include skin redness, resembling a rash, tissue breakdown / ulcers and hair loss (which can be temporary or permanent).   The likelihood of either of these occurring depends on the difficulty of the procedure and whether you are sensitive to radiation due to previous procedures, disease, or genetic conditions.   IF your procedure requires a prolonged use of radiation, you will be notified and given written instructions for further action.  It is your responsibility to monitor the irradiated area for the 2 weeks following the procedure and to notify your physician if you are concerned that you have suffered a radiation induced injury.    All of the patient's questions were answered, patient is agreeable to proceed.  Consent signed and in chart.  Creat 1.91 today    Electronically Signed: D. Rowe Robert, PA-C 04/18/2021, 8:46 AM   I spent a  total of 25 Minutes at the the patient's bedside AND on the patient's hospital floor or unit, greater than 50% of which was counseling/coordinating care for hepatic/visceral arteriogram with possible embolization and test Y 90 dosing

## 2021-04-18 NOTE — Discharge Instructions (Addendum)
°  Radiation Precautions:  discharge instructions for your next IR Y90 visit (05/02/21)  Post Y-90 Radioembolization Discharge Instructions  You have been given a radioactive material during your procedure.  While it is safe for you to be discharged home from the hospital, you need to proceed directly home.    Do not use public transportation, including air travel, lasting more than 2 hours for 1 week.  Avoid crowded public places for 1 week.  Adult visitors should try to avoid close contact with you for 1 week.    Children and pregnant females should not visit or have close contact with you for 1 week.  Items that you touch are not radioactive.  Do not sleep in the same bed as your partner for 1 week, and a condom should be used for sexual activity during the first 24 hours.  Your blood may be radioactive and caution should be used if any bleeding occurs during the recovery period.  Body fluids may be radioactive for 24 hours.  Wash your hands after voiding.  Men should sit to urinate.  Dispose of any soiled materials (flush down toilet or place in trash at home) during the first day.  Drink 6 to 8 glasses of fluids per day for 5 days to hydrate yourself.  If you need to see a doctor during the first week, you must let them know that you were treated with yttrium-90 microspheres, and will be slightly radioactive.  They can call Interventional Radiology [phone] with any questions.

## 2021-04-18 NOTE — Procedures (Signed)
Pre-procedure Diagnosis: Cholangiocarincoma Post-procedure Diagnosis: Same  Post mapping pre-90 radioembolization.    Complications: None Immediate  EBL: None  Keep right leg straight for 4 hrs (until 1500)  Signed: Sandi Mariscal Pager: [phone] 04/18/2021, 11:10 AM

## 2021-04-19 LAB — AFP TUMOR MARKER: AFP, Serum, Tumor Marker: 16.8 ng/mL — ABNORMAL HIGH (ref 0.0–8.7)

## 2021-04-19 LAB — CEA: CEA: 6.7 ng/mL — ABNORMAL HIGH (ref 0.0–4.7)

## 2021-04-21 DIAGNOSIS — Z961 Presence of intraocular lens: Secondary | ICD-10-CM | POA: Diagnosis not present

## 2021-04-21 DIAGNOSIS — H04123 Dry eye syndrome of bilateral lacrimal glands: Secondary | ICD-10-CM | POA: Diagnosis not present

## 2021-04-21 DIAGNOSIS — H0102A Squamous blepharitis right eye, upper and lower eyelids: Secondary | ICD-10-CM | POA: Diagnosis not present

## 2021-04-21 DIAGNOSIS — H43812 Vitreous degeneration, left eye: Secondary | ICD-10-CM | POA: Diagnosis not present

## 2021-04-21 DIAGNOSIS — H0288A Meibomian gland dysfunction right eye, upper and lower eyelids: Secondary | ICD-10-CM | POA: Diagnosis not present

## 2021-04-21 DIAGNOSIS — H0102B Squamous blepharitis left eye, upper and lower eyelids: Secondary | ICD-10-CM | POA: Diagnosis not present

## 2021-04-21 DIAGNOSIS — H0288B Meibomian gland dysfunction left eye, upper and lower eyelids: Secondary | ICD-10-CM | POA: Diagnosis not present

## 2021-04-21 DIAGNOSIS — H11153 Pinguecula, bilateral: Secondary | ICD-10-CM | POA: Diagnosis not present

## 2021-04-21 DIAGNOSIS — E119 Type 2 diabetes mellitus without complications: Secondary | ICD-10-CM | POA: Diagnosis not present

## 2021-04-21 DIAGNOSIS — H26493 Other secondary cataract, bilateral: Secondary | ICD-10-CM | POA: Diagnosis not present

## 2021-04-21 LAB — GLUCOSE, CAPILLARY: Glucose-Capillary: 105 mg/dL — ABNORMAL HIGH (ref 70–99)

## 2021-04-30 ENCOUNTER — Other Ambulatory Visit (HOSPITAL_COMMUNITY): Payer: Self-pay

## 2021-05-01 ENCOUNTER — Other Ambulatory Visit: Payer: Self-pay | Admitting: Student

## 2021-05-01 ENCOUNTER — Other Ambulatory Visit (HOSPITAL_COMMUNITY): Payer: Self-pay | Admitting: Physician Assistant

## 2021-05-01 ENCOUNTER — Other Ambulatory Visit: Payer: Self-pay | Admitting: Radiology

## 2021-05-02 ENCOUNTER — Encounter (HOSPITAL_COMMUNITY)
Admission: RE | Admit: 2021-05-02 | Discharge: 2021-05-02 | Disposition: A | Discharge status: Home / Self Care | Payer: Medicare Other | Source: Ambulatory Visit | Attending: Interventional Radiology | Admitting: Interventional Radiology

## 2021-05-02 ENCOUNTER — Telehealth: Payer: Self-pay | Admitting: Student

## 2021-05-02 ENCOUNTER — Encounter (HOSPITAL_COMMUNITY): Payer: Self-pay

## 2021-05-02 ENCOUNTER — Other Ambulatory Visit: Payer: Self-pay

## 2021-05-02 ENCOUNTER — Ambulatory Visit (HOSPITAL_COMMUNITY)
Admission: RE | Admit: 2021-05-02 | Discharge: 2021-05-02 | Disposition: A | Discharge status: Home / Self Care | Payer: Medicare Other | Source: Ambulatory Visit | Attending: Interventional Radiology | Admitting: Interventional Radiology

## 2021-05-02 ENCOUNTER — Other Ambulatory Visit (HOSPITAL_COMMUNITY): Payer: Self-pay | Admitting: Interventional Radiology

## 2021-05-02 DIAGNOSIS — C221 Intrahepatic bile duct carcinoma: Secondary | ICD-10-CM

## 2021-05-02 DIAGNOSIS — N183 Chronic kidney disease, stage 3 unspecified: Secondary | ICD-10-CM | POA: Insufficient documentation

## 2021-05-02 DIAGNOSIS — E785 Hyperlipidemia, unspecified: Secondary | ICD-10-CM | POA: Insufficient documentation

## 2021-05-02 DIAGNOSIS — Z20822 Contact with and (suspected) exposure to covid-19: Secondary | ICD-10-CM | POA: Diagnosis not present

## 2021-05-02 DIAGNOSIS — K219 Gastro-esophageal reflux disease without esophagitis: Secondary | ICD-10-CM | POA: Diagnosis not present

## 2021-05-02 DIAGNOSIS — I130 Hypertensive heart and chronic kidney disease with heart failure and stage 1 through stage 4 chronic kidney disease, or unspecified chronic kidney disease: Secondary | ICD-10-CM | POA: Diagnosis not present

## 2021-05-02 DIAGNOSIS — E1122 Type 2 diabetes mellitus with diabetic chronic kidney disease: Secondary | ICD-10-CM | POA: Diagnosis not present

## 2021-05-02 DIAGNOSIS — E1121 Type 2 diabetes mellitus with diabetic nephropathy: Secondary | ICD-10-CM | POA: Insufficient documentation

## 2021-05-02 DIAGNOSIS — I5032 Chronic diastolic (congestive) heart failure: Secondary | ICD-10-CM | POA: Insufficient documentation

## 2021-05-02 DIAGNOSIS — Z853 Personal history of malignant neoplasm of breast: Secondary | ICD-10-CM | POA: Diagnosis not present

## 2021-05-02 HISTORY — PX: IR ANGIOGRAM SELECTIVE EACH ADDITIONAL VESSEL: IMG667

## 2021-05-02 HISTORY — PX: IR ANGIOGRAM VISCERAL SELECTIVE: IMG657

## 2021-05-02 HISTORY — PX: IR 3D INDEPENDENT WKST: IMG2385

## 2021-05-02 HISTORY — PX: IR EMBO TUMOR ORGAN ISCHEMIA INFARCT INC GUIDE ROADMAPPING: IMG5449

## 2021-05-02 HISTORY — PX: IR US GUIDE VASC ACCESS RIGHT: IMG2390

## 2021-05-02 LAB — COMPREHENSIVE METABOLIC PANEL
ALT: 13 U/L (ref 0–44)
AST: 21 U/L (ref 15–41)
Albumin: 3.6 g/dL (ref 3.5–5.0)
Alkaline Phosphatase: 73 U/L (ref 38–126)
Anion gap: 8 (ref 5–15)
BUN: 60 mg/dL — ABNORMAL HIGH (ref 8–23)
CO2: 24 mmol/L (ref 22–32)
Calcium: 8.7 mg/dL — ABNORMAL LOW (ref 8.9–10.3)
Chloride: 105 mmol/L (ref 98–111)
Creatinine, Ser: 2.25 mg/dL — ABNORMAL HIGH (ref 0.44–1.00)
GFR, Estimated: 21 mL/min — ABNORMAL LOW (ref >60)
Glucose, Bld: 101 mg/dL — ABNORMAL HIGH (ref 70–99)
Potassium: 4.2 mmol/L (ref 3.5–5.1)
Sodium: 137 mmol/L (ref 135–145)
Total Bilirubin: 0.2 mg/dL — ABNORMAL LOW (ref 0.3–1.2)
Total Protein: 7.3 g/dL (ref 6.5–8.1)

## 2021-05-02 LAB — CBC WITH DIFFERENTIAL/PLATELET
Abs Immature Granulocytes: 0.02 10*3/uL (ref 0.00–0.07)
Basophils Absolute: 0 10*3/uL (ref 0.0–0.1)
Basophils Relative: 1 %
Eosinophils Absolute: 0.3 10*3/uL (ref 0.0–0.5)
Eosinophils Relative: 4 %
HCT: 34.4 % — ABNORMAL LOW (ref 36.0–46.0)
Hemoglobin: 11 g/dL — ABNORMAL LOW (ref 12.0–15.0)
Immature Granulocytes: 0 %
Lymphocytes Relative: 26 %
Lymphs Abs: 1.7 10*3/uL (ref 0.7–4.0)
MCH: 29.9 pg (ref 26.0–34.0)
MCHC: 32 g/dL (ref 30.0–36.0)
MCV: 93.5 fL (ref 80.0–100.0)
Monocytes Absolute: 0.9 10*3/uL (ref 0.1–1.0)
Monocytes Relative: 13 %
Neutro Abs: 3.6 10*3/uL (ref 1.7–7.7)
Neutrophils Relative %: 56 %
Platelets: 185 10*3/uL (ref 150–400)
RBC: 3.68 MIL/uL — ABNORMAL LOW (ref 3.87–5.11)
RDW: 15.3 % (ref 11.5–15.5)
WBC: 6.4 10*3/uL (ref 4.0–10.5)
nRBC: 0 % (ref 0.0–0.2)

## 2021-05-02 LAB — PROTIME-INR
INR: 1.1 (ref 0.8–1.2)
Prothrombin Time: 14.5 seconds (ref 11.4–15.2)

## 2021-05-02 LAB — GLUCOSE, CAPILLARY: Glucose-Capillary: 85 mg/dL (ref 70–99)

## 2021-05-02 MED ORDER — ONDANSETRON 8 MG/NS 50 ML IVPB
8.0000 mg | INTRAVENOUS | Status: AC
  Administered 2021-05-02: 8 mg via INTRAVENOUS
  Filled 2021-05-02: qty 54

## 2021-05-02 MED ORDER — DEXAMETHASONE SODIUM PHOSPHATE 10 MG/ML IJ SOLN
8.0000 mg | INTRAMUSCULAR | Status: AC
  Administered 2021-05-02: 8 mg via INTRAVENOUS
  Filled 2021-05-02: qty 1

## 2021-05-02 MED ORDER — SODIUM CHLORIDE 0.9 % IV SOLN: INTRAVENOUS | Status: DC

## 2021-05-02 MED ORDER — YTTRIUM 90 INJECTION
17.9300 | INJECTION | Freq: Once | INTRAVENOUS | Status: AC
  Administered 2021-05-02: 17.93 via INTRAVENOUS

## 2021-05-02 MED ORDER — SODIUM CHLORIDE 0.9 % IV SOLN
8.0000 mg | INTRAVENOUS | Status: DC
  Filled 2021-05-02: qty 4

## 2021-05-02 MED ORDER — MIDAZOLAM HCL 2 MG/2ML IJ SOLN
INTRAMUSCULAR | Status: AC | PRN
  Administered 2021-05-02: .5 mg via INTRAVENOUS

## 2021-05-02 MED ORDER — LIDOCAINE HCL (PF) 1 % IJ SOLN
INTRAMUSCULAR | Status: AC | PRN
  Administered 2021-05-02: 10 mL via INTRADERMAL

## 2021-05-02 MED ORDER — FENTANYL CITRATE (PF) 100 MCG/2ML IJ SOLN
INTRAMUSCULAR | Status: AC | PRN
  Administered 2021-05-02: 50 ug via INTRAVENOUS

## 2021-05-02 MED ORDER — SODIUM CHLORIDE 0.9 % IV SOLN
2.0000 g | Freq: Once | INTRAVENOUS | Status: AC
  Administered 2021-05-02: 2 g via INTRAVENOUS
  Filled 2021-05-02: qty 2

## 2021-05-02 MED ORDER — LIDOCAINE HCL 1 % IJ SOLN
INTRAMUSCULAR | Status: AC
Start: 2021-05-02 — End: 2021-05-02
  Filled 2021-05-02: qty 20

## 2021-05-02 MED ORDER — FENTANYL CITRATE (PF) 100 MCG/2ML IJ SOLN
INTRAMUSCULAR | Status: AC
  Filled 2021-05-02: qty 2

## 2021-05-02 MED ORDER — MIDAZOLAM HCL 2 MG/2ML IJ SOLN
INTRAMUSCULAR | Status: AC | PRN
  Administered 2021-05-02: 1 mg via INTRAVENOUS

## 2021-05-02 MED ORDER — OXYCODONE HCL 5 MG PO TABS: 5.0000 mg | ORAL_TABLET | ORAL | 0 refills | Status: DC | PRN

## 2021-05-02 MED ORDER — MIDAZOLAM HCL 2 MG/2ML IJ SOLN
INTRAMUSCULAR | Status: AC
  Filled 2021-05-02: qty 2

## 2021-05-02 MED ORDER — LIDOCAINE HCL (PF) 1 % IJ SOLN
INTRAMUSCULAR | Status: AC | PRN
  Administered 2021-05-02: 5 mL via INTRADERMAL

## 2021-05-02 MED ORDER — OXYCODONE-ACETAMINOPHEN 5-325 MG PO TABS
2.0000 | ORAL_TABLET | ORAL | Status: AC
  Administered 2021-05-02: 2 via ORAL
  Filled 2021-05-02: qty 2

## 2021-05-02 MED ORDER — IOHEXOL 300 MG/ML  SOLN
100.0000 mL | Freq: Once | INTRAMUSCULAR | Status: AC | PRN
  Administered 2021-05-02: 22 mL via INTRA_ARTERIAL

## 2021-05-02 MED ORDER — PANTOPRAZOLE SODIUM 40 MG IV SOLR
40.0000 mg | Freq: Once | INTRAVENOUS | Status: AC
  Administered 2021-05-02: 40 mg via INTRAVENOUS
  Filled 2021-05-02: qty 10

## 2021-05-02 MED ORDER — IOHEXOL 300 MG/ML  SOLN
100.0000 mL | Freq: Once | INTRAMUSCULAR | Status: AC | PRN
  Administered 2021-05-02: 50 mL via INTRA_ARTERIAL

## 2021-05-02 MED ORDER — IOHEXOL 300 MG/ML  SOLN
100.0000 mL | Freq: Once | INTRAMUSCULAR | Status: AC | PRN
  Administered 2021-05-02: 20 mL via INTRA_ARTERIAL

## 2021-05-02 MED ORDER — HEPARIN SOD (PORK) LOCK FLUSH 100 UNIT/ML IV SOLN
500.0000 [IU] | INTRAVENOUS | Status: AC | PRN
  Administered 2021-05-02: 500 [IU]
  Filled 2021-05-02: qty 5

## 2021-05-02 NOTE — Procedures (Signed)
Pre-procedure Diagnosis: Multifocal cholangiocarcinoma Post-procedure Diagnosis: Same  Post bland embolization of accessory left hepatic artery and Y-90 radioembolization of the right hepatic artery..    Complications: None Immediate  EBL: None  Keep right leg straight for 4 hrs (until 1615)  Signed: Sandi Mariscal Pager: [phone] 05/02/2021, 12:14 PM

## 2021-05-02 NOTE — Sedation Documentation (Signed)
In nuc med 

## 2021-05-02 NOTE — Telephone Encounter (Signed)
Patient underwent Y90 and bland embolization today with Dr. Pascal Lux. Prescription for Oxycodone 5 mg #20 (take every 4 hours as needed for pain) e-prescribed to  [facility] on [address].   Donna May, [name] [phone] 05/02/2021, 1:07 PM

## 2021-05-02 NOTE — H&P (Addendum)
Referring Physician(s): Feng,Y  Supervising Physician: Sandi Mariscal  Patient Status:  [facility]  Chief Complaint: Intrahepatic cholangiocarcinoma   Subjective: Patient familiar to IR service from liver lesion biopsy in 2020 and consultation with Dr. Pascal Lux on 01/30/2021 to discuss treatment options for intrahepatic cholangiocarcinoma.  Her past medical history is also significant for anemia, anxiety, arthritis, remote breast cancer, CHF, chronic kidney disease, depression, diabetes, GERD, hyperlipidemia, hypertension, peripheral neuropathy.  She was diagnosed with cholangiocarcinoma on liver biopsy in 2020.  She initially underwent attempted surgical resection however was found to have peritoneal mets at the time of surgery and the surgery was aborted. Since that time, she has undergone several rounds of systemic therapy, most recently gemcitabine with durvalumab, which overall she tolerated well except for persistent neuropathy primarily affecting her hands and lower legs.PET/CT performed 01/02/2021 demonstrated progression of disease, though fortunately, the disease appears limited to the liver .  Following discussions with Dr. Pascal Lux she was deemed an appropriate candidate for Y 90 hepatic radioembolization and presents today for the procedure. She currently denies fever,HA,CP,dyspnea, cough, abd pain, back pain,N/V or bleeding. She does feel cold.    Past Medical History:  Diagnosis Date   Anemia    Anxiety    Arthritis    Breast cancer ([place]) 1992   right   Cataract    Bilateral eyes - surgery to remove   Chronic diastolic CHF (congestive heart failure) (HCC)    Chronic kidney disease    CKD stage 3   Complication of anesthesia    "something they use make me itch for a couple of days."   Depression    Diabetes mellitus    type 2   Duodenitis 01/18/2002   Fainting spell    GERD (gastroesophageal reflux disease)    Heart murmur    never has caused any problems   Hiatal hernia  08/08/2008, 01/18/2002   History of pneumonia    x 2   Hyperlipidemia    Hypertension    Liver cancer ([place]) 08/2018   chemotherapy   Lymphedema 2017   Right arm   Peripheral neuropathy    Past Surgical History:  Procedure Laterality Date   AXILLARY SURGERY     cyst removal, right   COLONOSCOPY  09/23/2018   Dr. Havery Moros - polyps   EYE SURGERY Bilateral    cataracts to remove   IR ANGIOGRAM SELECTIVE EACH ADDITIONAL VESSEL  04/18/2021   IR ANGIOGRAM SELECTIVE EACH ADDITIONAL VESSEL  04/18/2021   IR ANGIOGRAM VISCERAL SELECTIVE  04/18/2021   IR RADIOLOGIST EVAL & MGMT  01/30/2021   IR US GUIDE VASC ACCESS RIGHT  04/18/2021   LAPAROSCOPY N/A 11/17/2018   Procedure: LAPAROSCOPY DIAGNOSTIC, INTRAOPERATIVE ULTRASOUND, PERITONEAL BIOPSIES;  Surgeon: [name], MD;  Location: [facility];  Service: General;  Laterality: N/A;  GENERAL AND EPIDURAL   LIVER BIOPSY  08/2018   Dr. Lindwood Coke   MASTECTOMY  1992   right, with flap   NM [place]  06/11/2009   Protocol:Bruce, post stress EF58%, EKG negative for ischemia, low risk   PORTACATH PLACEMENT N/A 12/02/2018   Procedure: INSERTION PORT-A-CATH;  Surgeon: [name], MD;  Location: [facility];  Service: General;  Laterality: N/A;   RECONSTRUCTION BREAST W/ TRAM FLAP Right    TONSILLECTOMY  1958   TRANSTHORACIC ECHOCARDIOGRAM  12/24/2009   LVEF =>55%, normal study   UPPER GI ENDOSCOPY        Allergies: Patient has no known allergies.  Medications: Prior to Admission medications   Medication Sig Start Date End Date Taking? Authorizing Provider  amLODipine (NORVASC) 10 MG tablet Take 1 tablet (10 mg total) by mouth daily. 03/28/21  Yes Deberah Pelton, NP  aspirin 81 MG tablet Take 81 mg by mouth daily.    Yes [provider]  carvedilol (COREG) 25 MG tablet Take 1 tablet (25 mg total) by mouth 2 (two) times daily with a meal. 04/16/21  Yes [name], Mihai, MD  furosemide (LASIX) 40 MG tablet Take 1 tablet (40 mg total) by  mouth daily. 03/28/21  Yes Cleaver, Jossie Ng, NP  glucose blood test strip 1 each by Other route 2 (two) times daily. Use Onetouch verio test strips as instructed to check blood sugar twice daily. 01/05/19  Yes Elayne Snare, MD  hydrALAZINE (APRESOLINE) 100 MG tablet TAKE THREE TIMES DAILY-AM, MIDDLE OF THE DAY AND IN THE EVENING. 09/13/20  Yes [name], Mihai, MD  Ivosidenib (TIBSOVO) 250 MG TABS Take 2 tablets (500mg ) by mouth daily. 04/04/21  Yes [name], MD  lansoprazole (PREVACID) 15 MG capsule Take 1 capsule (15 mg total) by mouth daily at 12 noon. Take 1 capsule by mouth once or twice a day as needed 11/01/20  Yes [name], [name], MD  lidocaine-prilocaine (EMLA) cream Apply 1 application topically as needed. 08/21/20  Yes [name], MD  loratadine (CLARITIN) 10 MG tablet Take 10 mg by mouth daily as needed for allergies.   Yes [provider]  Omega 3 1000 MG CAPS Take 2,000 mg by mouth daily.    Yes [provider]  polyethylene glycol (MIRALAX / GLYCOLAX) 17 g packet Take 17 g by mouth daily as needed. 11/01/20  Yes [name], [name], MD  potassium chloride (KLOR-CON M10) 10 MEQ tablet Take 1 tablet (10 mEq total) by mouth daily. 03/28/21  Yes Cleaver, Jossie Ng, NP  prochlorperazine (COMPAZINE) 10 MG tablet Take 1 tablet (10 mg total) by mouth every 6 (six) hours as needed for nausea or vomiting. 03/25/21  Yes [name], MD  rosuvastatin (CRESTOR) 20 MG tablet Take 1 tablet (20 mg total) by mouth daily. 04/02/21 07/01/21 Yes Cleaver, Jossie Ng, NP  sitaGLIPtin (JANUVIA) 100 MG tablet Take 1 tablet (100 mg total) by mouth daily. 04/08/20  Yes [name], MD  valsartan (DIOVAN) 320 MG tablet Take 1 tablet (320 mg total) by mouth daily. 02/07/20  Yes Cleaver, Jossie Ng, NP  Baclofen 5 MG TABS TAKE 1/2 TABLET BY MOUTH ONCE DAILY AS NEEDED 12/20/20   [name], MD  gabapentin (NEURONTIN) 100 MG capsule Take 1 capsule (100 mg total) by mouth every 8 (eight) hours as needed. 05/15/20    [name], MD     Vital Signs: BP (!) 147/85    Pulse (!) 57    Temp 98.8 F (37.1 C) (Oral)    Resp 16    Ht 5\' 5"  (1.651 m)    Wt 130 lb (59 kg)    LMP  (LMP Unknown)    SpO2 100%    BMI 21.63 kg/m   Physical Exam awake/alert; chest- CTA bilat; clean, intact left chest wall Port-A-Cath.  Heart with slightly bradycardic but regular rhythm.  Abdomen soft, positive bowel sounds, nontender.  No lower extremity edema.  Imaging: No results found.  Labs:  CBC: Recent Labs    03/20/21 0943 04/09/21 0831 04/18/21 0825 05/02/21 0730  WBC 5.7 6.2 5.9 6.4  HGB 11.1* 11.1* 11.2* 11.0*  HCT  34.3* 34.8* 34.9* 34.4*  PLT 190 187 205 185    COAGS: Recent Labs    04/18/21 0825 05/02/21 0730  INR 1.2 1.1    BMP: Recent Labs    03/20/21 0943 04/09/21 0831 04/18/21 0825 05/02/21 0730  NA 140 143 137 137  K 4.2 4.2 4.4 4.2  CL 104 109 106 105  CO2 29 27 23 24   GLUCOSE 105* 96 89 101*  BUN 40* 49* 55* 60*  CALCIUM 9.0 9.1 8.9 8.7*  CREATININE 1.61* 1.79* 1.91* 2.25*  GFRNONAA 32* 28* 26* 21*    LIVER FUNCTION TESTS: Recent Labs    03/28/21 0842 04/09/21 0831 04/18/21 0825 05/02/21 0730  BILITOT <0.2 0.3 0.5 0.2*  AST 23 19 19 21   ALT 9 7 10 13   ALKPHOS 106 79 74 73  PROT 7.0 7.2 7.2 7.3  ALBUMIN 4.1 3.7 3.6 3.6    Assessment and Plan: Patient familiar to IR service from liver lesion biopsy in 2020 and consultation with Dr. Pascal Lux on 01/30/2021 to discuss treatment options for intrahepatic cholangiocarcinoma.  Her past medical history is also significant for anemia, anxiety, arthritis, remote breast cancer, CHF, chronic kidney disease, depression, diabetes, GERD, hyperlipidemia, hypertension, peripheral neuropathy.  She was diagnosed with cholangiocarcinoma on liver biopsy in 2020.  She initially underwent attempted surgical resection however was found to have peritoneal mets at the time of surgery and the surgery was aborted. Since that time, she has undergone several  rounds of systemic therapy, most recently gemcitabine with durvalumab, which overall she tolerated well except for persistent neuropathy primarily affecting her hands and lower legs.PET/CT performed 01/02/2021 demonstrated progression of disease, though fortunately, the disease appears limited to the liver .  Following discussions with Dr. Pascal Lux she was deemed an appropriate candidate for Y 90 hepatic radioembolization and presents today for the procedure.Risks and benefits of procedure were discussed with the patient including, but not limited to bleeding, infection, vascular injury or contrast induced renal failure.  This interventional procedure involves the use of X-rays and because of the nature of the planned procedure, it is possible that we will have prolonged use of X-ray fluoroscopy.  Potential radiation risks to you include (but are not limited to) the following: - A slightly elevated risk for cancer  several years later in life. This risk is typically less than 0.5% percent. This risk is low in comparison to the normal incidence of human cancer, which is 33% for women and 50% for men according to the [place]. - Radiation induced injury can include skin redness, resembling a rash, tissue breakdown / ulcers and hair loss (which can be temporary or permanent).   The likelihood of either of these occurring depends on the difficulty of the procedure and whether you are sensitive to radiation due to previous procedures, disease, or genetic conditions.   IF your procedure requires a prolonged use of radiation, you will be notified and given written instructions for further action.  It is your responsibility to monitor the irradiated area for the 2 weeks following the procedure and to notify your physician if you are concerned that you have suffered a radiation induced injury.    All of the patient's questions were answered, patient is agreeable to proceed.  Consent signed and in  chart.  Creat today 2.25    Electronically Signed: D. Rowe Robert, PA-C 05/02/2021, 8:14 AM   I spent a total of 20 minutes at the the patient's bedside AND on the patient's  hospital floor or unit, greater than 50% of which was counseling/coordinating care for visceral/hepatic arteriogram with hepatic Y90 radioembolization

## 2021-05-02 NOTE — Discharge Instructions (Signed)
Please call Interventional Radiology clinic [phone] with any questions or concerns.  You may remove your dressing and shower tomorrow.    Hepatic Artery Radioembolization, Care After The following information offers guidance on how to care for yourself after your procedure. Your health care provider may also give you more specific instructions. If you have problems or questions, contact your health care provider. What can I expect after the procedure? After the procedure, it is possible to have: A slight fever for 7 to 10 days. This may be accompanied by pain, nausea, or vomiting, which is referred to as post-embolization syndrome. You may be given medicine to help relieve these symptoms. If your fever gets worse, tell your health care provider. Tiredness (fatigue). Loss of appetite. This should gradually improve after about 1 week. Abdominal pain on your right side. Soreness and tenderness in your groin area where the needle and catheter were placed (puncture site). Follow these instructions at home: Puncture site care Follow instructions from your health care provider about how to take care of the puncture site. Make sure you: Wash your hands with soap and water for at least 20 seconds before and after you change your bandage (dressing). If soap and water are not available, use hand sanitizer. Change your dressing as told by your health care provider. Check your puncture site every day for signs of infection. Check for: More redness, swelling, or pain. Fluid or blood. Warmth. Pus or a bad smell. Activity Rest as told by your health care provider. Return to your normal activities as told by your health care provider. Ask your health care provider what activities are safe for you. Avoid sitting for a long time without moving. Get up to take short walks every 1-2 hours. This is important to improve blood flow and breathing. Ask for help if you feel weak or unsteady. If you were given  a sedative during the procedure, it can affect you for several hours. Do not drive or operate machinery until your health care provider says that it is safe. Do not lift anything that is heavier than 10 lb (4.5 kg), or the limit that you are told, until your health care provider says that it is safe. Medicines Take over-the-counter and prescription medicines only as told by your health care provider. Ask your health care provider if the medicine prescribed to you: Requires you to avoid driving or using machinery. Can cause constipation. You may need to take these actions to prevent or treat constipation: Drink enough fluid to keep your urine pale yellow. Take over-the-counter or prescription medicines. Eat foods that are high in fiber, such as beans, whole grains, and fresh fruits and vegetables. Limit foods that are high in fat and processed sugars, such as fried or sweet foods. General instructions Eat frequent, small meals until your appetite returns. Follow instructions from your health care provider about eating or drinking restrictions. Do not take baths, swim, or use a hot tub until your health care provider approves. You may take showers. Wash your puncture site with mild soap and water, and pat the area dry. Wear compression stockings as told by your health care provider. These stockings help to prevent blood clots and reduce swelling in your legs. Keep all follow-up visits. This is important. You may need to have blood tests and imaging tests. Contact a health care provider if: You have any of these signs of infection: More redness, swelling, or pain around your puncture site. Fluid or blood coming from your  puncture site. Warmth coming from your puncture site. Pus or a bad smell coming from your puncture site. You have pain that: Gets worse. Does not get better with medicine. Feels like very bad heartburn. Is in the middle of your abdomen, above your belly button. You have any  signs of infection or liver failure, such as: Your skin or the white parts of your eyes turn yellow (jaundice). The color of your urine changes to dark brown. The color of your stool (feces) changes to light yellow. Your abdominal measurement (girth) increases in a short period of time. You gain more than 5 lb (2.3 kg) in a short period of time. Get help right away if: You have a fever that lasts more than 10 days or is higher than what your health care provider told you to expect. You develop any of the following in your legs: Pain. Swelling. Skin that is cold or pale or turns blue. You have chest pain. You have blood in your vomit, saliva, or stool. You have trouble breathing. These symptoms may represent a serious problem that is an emergency. Do not wait to see if the symptoms will go away. Get medical help right away. Call your local emergency services (911 in the [place] Do not drive yourself to the hospital. Summary After the procedure, it is possible to have a slight fever for up to 7-10 days, tiredness, loss of appetite, abdominal pain on the right side, and groin tenderness where the catheter was placed. Do not come in close contact with people for up to a week after your procedure, as told by your health care provider. Follow instructions from your health care provider about how to take care of the puncture site. Contact a health care provider if you have any signs of infection. Get help right away if you develop pain or swelling in your legs or if your legs feel cool or look pale. This information is not intended to replace advice given to you by your health care provider. Make sure you discuss any questions you have with your health care provider. Document Revised: 02/04/2020 Document Reviewed: 02/04/2020 Elsevier Patient Education  2022 [place].      Moderate Conscious Sedation, Adult, Care After This sheet gives you information about how to care for yourself after  your procedure. Your health care provider may also give you more specific instructions. If you have problems or questions, contact your health care provider. What can I expect after the procedure? After the procedure, it is common to have: Sleepiness for several hours. Impaired judgment for several hours. Difficulty with balance. Vomiting if you eat too soon. Follow these instructions at home: For the time period you were told by your health care provider: Rest. Do not participate in activities where you could fall or become injured. Do not drive or use machinery. Do not drink alcohol. Do not take sleeping pills or medicines that cause drowsiness. Do not make important decisions or sign legal documents. Do not take care of children on your own.      Eating and drinking Follow the diet recommended by your health care provider. Drink enough fluid to keep your urine pale yellow. If you vomit: Drink water, juice, or soup when you can drink without vomiting. Make sure you have little or no nausea before eating solid foods.   General instructions Take over-the-counter and prescription medicines only as told by your health care provider. Have a responsible adult stay with you for the time  you are told. It is important to have someone help care for you until you are awake and alert. Do not smoke. Keep all follow-up visits as told by your health care provider. This is important. Contact a health care provider if: You are still sleepy or having trouble with balance after 24 hours. You feel light-headed. You keep feeling nauseous or you keep vomiting. You develop a rash. You have a fever. You have redness or swelling around the IV site. Get help right away if: You have trouble breathing. You have new-onset confusion at home. Summary After the procedure, it is common to feel sleepy, have impaired judgment, or feel nauseous if you eat too soon. Rest after you get home. Know the things you  should not do after the procedure. Follow the diet recommended by your health care provider and drink enough fluid to keep your urine pale yellow. Get help right away if you have trouble breathing or new-onset confusion at home. This information is not intended to replace advice given to you by your health care provider. Make sure you discuss any questions you have with your health care provider. Document Revised: 06/30/2019 Document Reviewed: 01/26/2019 Elsevier Patient Education  [place].     Radiation precautions For up to a week after your procedure, there will be a small amount of radioactivity near your liver. This is not especially dangerous to other people. However, as told by your health care provider, you should follow these precautions for 7 days: Do not come in close contact with people. Do not sleep in the same bed as someone else. Do not hold children or babies. Do not have contact with pregnant women.  Post Y-90 Radioembolization Discharge Instructions  You have been given a radioactive material during your procedure.  While it is safe for you to be discharged home from the hospital, you need to proceed directly home.    Do not use public transportation, including air travel, lasting more than 2 hours for 1 week.  Avoid crowded public places for 1 week.  Adult visitors should try to avoid close contact with you for 1 week.    Children and pregnant females should not visit or have close contact with you for 1 week.  Items that you touch are not radioactive.  Do not sleep in the same bed as your partner for 1 week, and a condom should be used for sexual activity during the first 24 hours.  Your blood may be radioactive and caution should be used if any bleeding occurs during the recovery period.  Body fluids may be radioactive for 24 hours.  Wash your hands after voiding.  Men should sit to urinate.  Dispose of any soiled materials (flush down toilet or place  in trash at home) during the first day.  Drink 6 to 8 glasses of fluids per day for 5 days to hydrate yourself.  If you need to see a doctor during the first week, you must let them know that you were treated with yttrium-90 microspheres, and will be slightly radioactive.  They can call Interventional Radiology [phone] with any questions.

## 2021-05-02 NOTE — Progress Notes (Signed)
Pt ambulates in hall prior to discharge without difficulty .  Right groin dressing changed, site WNL.

## 2021-05-08 ENCOUNTER — Other Ambulatory Visit: Payer: Self-pay | Admitting: Hematology

## 2021-05-08 DIAGNOSIS — C221 Intrahepatic bile duct carcinoma: Secondary | ICD-10-CM

## 2021-05-08 MED ORDER — TIBSOVO 250 MG PO TABS: 500.0000 mg | ORAL_TABLET | Freq: Every day | ORAL | 2 refills | Status: DC

## 2021-05-12 DIAGNOSIS — Z20822 Contact with and (suspected) exposure to covid-19: Secondary | ICD-10-CM | POA: Diagnosis not present

## 2021-05-13 ENCOUNTER — Other Ambulatory Visit (HOSPITAL_COMMUNITY): Payer: Self-pay

## 2021-05-13 ENCOUNTER — Other Ambulatory Visit: Payer: Self-pay | Admitting: Interventional Radiology

## 2021-05-13 DIAGNOSIS — C221 Intrahepatic bile duct carcinoma: Secondary | ICD-10-CM

## 2021-05-14 ENCOUNTER — Inpatient Hospital Stay: Payer: Medicare Other | Attending: Adult Health | Admitting: Hematology

## 2021-05-14 ENCOUNTER — Other Ambulatory Visit: Payer: Self-pay

## 2021-05-14 ENCOUNTER — Encounter: Payer: Self-pay | Admitting: Hematology

## 2021-05-14 ENCOUNTER — Inpatient Hospital Stay: Payer: Medicare Other

## 2021-05-14 VITALS — BP 152/58 | HR 61 | Temp 98.2°F | Resp 18 | Ht 65.0 in | Wt 131.6 lb

## 2021-05-14 DIAGNOSIS — Z95828 Presence of other vascular implants and grafts: Secondary | ICD-10-CM

## 2021-05-14 DIAGNOSIS — G620 Drug-induced polyneuropathy: Secondary | ICD-10-CM

## 2021-05-14 DIAGNOSIS — C221 Intrahepatic bile duct carcinoma: Secondary | ICD-10-CM | POA: Diagnosis not present

## 2021-05-14 DIAGNOSIS — C786 Secondary malignant neoplasm of retroperitoneum and peritoneum: Secondary | ICD-10-CM | POA: Insufficient documentation

## 2021-05-14 DIAGNOSIS — T451X5A Adverse effect of antineoplastic and immunosuppressive drugs, initial encounter: Secondary | ICD-10-CM

## 2021-05-14 DIAGNOSIS — Z79899 Other long term (current) drug therapy: Secondary | ICD-10-CM | POA: Insufficient documentation

## 2021-05-14 DIAGNOSIS — N183 Chronic kidney disease, stage 3 unspecified: Secondary | ICD-10-CM | POA: Diagnosis not present

## 2021-05-14 LAB — CBC WITH DIFFERENTIAL (CANCER CENTER ONLY)
Abs Immature Granulocytes: 0.04 10*3/uL (ref 0.00–0.07)
Basophils Absolute: 0 10*3/uL (ref 0.0–0.1)
Basophils Relative: 0 %
Eosinophils Absolute: 0.2 10*3/uL (ref 0.0–0.5)
Eosinophils Relative: 3 %
HCT: 32.6 % — ABNORMAL LOW (ref 36.0–46.0)
Hemoglobin: 10.3 g/dL — ABNORMAL LOW (ref 12.0–15.0)
Immature Granulocytes: 1 %
Lymphocytes Relative: 12 %
Lymphs Abs: 0.7 10*3/uL (ref 0.7–4.0)
MCH: 28.9 pg (ref 26.0–34.0)
MCHC: 31.6 g/dL (ref 30.0–36.0)
MCV: 91.3 fL (ref 80.0–100.0)
Monocytes Absolute: 0.7 10*3/uL (ref 0.1–1.0)
Monocytes Relative: 12 %
Neutro Abs: 4.3 10*3/uL (ref 1.7–7.7)
Neutrophils Relative %: 72 %
Platelet Count: 236 10*3/uL (ref 150–400)
RBC: 3.57 MIL/uL — ABNORMAL LOW (ref 3.87–5.11)
RDW: 15.6 % — ABNORMAL HIGH (ref 11.5–15.5)
WBC Count: 5.9 10*3/uL (ref 4.0–10.5)
nRBC: 0 % (ref 0.0–0.2)

## 2021-05-14 LAB — CMP (CANCER CENTER ONLY)
ALT: 8 U/L (ref 0–44)
AST: 16 U/L (ref 15–41)
Albumin: 3.6 g/dL (ref 3.5–5.0)
Alkaline Phosphatase: 76 U/L (ref 38–126)
Anion gap: 7 (ref 5–15)
BUN: 39 mg/dL — ABNORMAL HIGH (ref 8–23)
CO2: 27 mmol/L (ref 22–32)
Calcium: 8.9 mg/dL (ref 8.9–10.3)
Chloride: 106 mmol/L (ref 98–111)
Creatinine: 1.62 mg/dL — ABNORMAL HIGH (ref 0.44–1.00)
GFR, Estimated: 32 mL/min — ABNORMAL LOW (ref >60)
Glucose, Bld: 145 mg/dL — ABNORMAL HIGH (ref 70–99)
Potassium: 4.3 mmol/L (ref 3.5–5.1)
Sodium: 140 mmol/L (ref 135–145)
Total Bilirubin: 0.3 mg/dL (ref 0.3–1.2)
Total Protein: 6.9 g/dL (ref 6.5–8.1)

## 2021-05-14 MED ORDER — SODIUM CHLORIDE 0.9% FLUSH
10.0000 mL | Freq: Once | INTRAVENOUS | Status: AC
  Administered 2021-05-14: 10 mL

## 2021-05-14 MED ORDER — HEPARIN SOD (PORK) LOCK FLUSH 100 UNIT/ML IV SOLN
500.0000 [IU] | Freq: Once | INTRAVENOUS | Status: AC
  Administered 2021-05-14: 500 [IU]

## 2021-05-14 NOTE — Progress Notes (Signed)
[place]   [phone] [phone]   Clinic Follow up Note   Patient Care Team: [name], MD as PCP - General (Family Medicine) [name], [name], MD as PCP - Cardiology (Cardiology) [name], [name], MD as Consulting Physician (Cardiology) [name], MD as Consulting Physician (Obstetrics and Gynecology) [name], [name], NP as Nurse Practitioner (Hematology and Oncology) [facility] [name], MD as Consulting Physician (Hematology) [name], [name], MD as Consulting Physician (Gastroenterology) [name], RN as Oncology Nurse Navigator [name], MD as Consulting Physician (General Surgery)  Date of Service:  05/14/2021  CHIEF COMPLAINT: f/u of cholangiocarcinoma  CURRENT THERAPY:  S/p liver targeted therapy Y90 on 05/02/21, pending -Ivoseidenib 510m daily, started 02/25/21, on hold for Y90  ASSESSMENT & PLAN:  Donna KUKUKis a 81y.o. female with   1. Intrahepatic cholangiocarcinoma, cT1N0M1, with peritoneal metastasis, MSS, IDH1 mutation (+) -Diagnosed in 09/2018; biopsy of her liver mass shows adenocarcinoma, most consistent with cholangiocarcinoma. 09/29/18 PET scan showed no evidence of distant metastasis. -she was found to have peritoneal metastasis to right diaphragm and surgery was aborted. -Her CT AP from 11/29/18 shows dominate liver mass stable to mild increase, mild nodularity of both adrenal glands which warrants being monitored.  No visible peritoneal metastasis on CT scan. -FO results showed MSI stable disease, IDH mutation positive. Consider IDH 1 inhibitor in the future.   -She began first line oxaliplatin (due to CKD) and gemcitabine every 2 weeks on 11/30/18. Oxali was dose reduced and eventually stopped after cycle 11. She received single agent gemcitabine with cycle 12. Cisplatin added with C13 on 05/17/19 but neuropathy worsened and stopped on 06/14/19. She received gemcitabine  every 2 weeks starting from 06/28/19 C16. She took chemo break from 10/21 - 1/22 -PET from 03/14/20 showed concern for disease progression in liver, she restarted maintenance gemcitabine every 2 weeks from 04/03/20. -I added durvalumab to gemcitabine, due to recurrent disease on PET 09/2020. She tolerated well overall, except persistent neuropathy  -restaging PET on 01/02/21 showed further progression of cancer in liver. I stopped gemcitabine  -she met with IR on 01/30/21 to discuss possible Y90. She was felt to be a good candidate. -CT AP 03/07/21 showed mild cancer progression in liver, no other new mets -she was started on ivosidenib 500 mg daily on 02/25/21 while awaiting Y90. She is tolerating well, aside from possibly some increased neuropathy. We will continue on ivosidenib and monitor her neuropathy.  -she underwent left lobe Y90 on 05/02/21; she tolerated well overall. She remains off the ivosidenib for now. I will reach out to Dr. WPascal Luxto determine when he plans to proceed with second Y90. If that will be more than a month, I will let her restart Ivosidenib    2. Symptom Management: taste change -she reports taste change on the ivosidenib. I previously advised her to try Glucerna, given her DM.   3. Grade 3 CIPN  -secondary to Oxaliplatin, eventually dc'd after cycle 11.  Her diabetes may contribute to it also. -started cisplatin on 05/17/19, neuropathy worsened in her feet with unsteady gait and functional limitations, decreased vibratory sense on tuning fork exam 05/31/19. Cisplatin was dose reduced then eventually stopped on 06/14/19.  -low efficacy of gabapentin and cymbalta, also tried lyrica briefly.  She completed PT for balance. She uses gabapentin as needed. -Follow-up open with Dr. VMickeal Skinner pt did not find helpful. Also seen by chiropractor. I also suggested acupuncture. -she reports  aggravated tingling to her arms and legs on the ivosidenib. We will monitor. -she would like to try  CBD -I encourage her to try acupuncture, she will think about it     4. H/o right breast cancer, Genetics negative  -s/p mastectomy in 1992, per patient did not require adjuvant therapy -previously followed by Dr. Jana Hakim  -VUS of gene APC; genetics otherwise normal   5. Comorbidities: DM, HTN, hiatal hernia, GERD, renal artery stenosis, CHF -Follow-up with PCP, nephrology, and Dr. Dwyane Dee, and cardiology -stable, well controlled   6. Family support -she has a son and daughter who are supportive. -patient remains independent    7. Goal of care discussion -We frequently discuss the goal of her treatment is palliative and that her cancer is not curable with chemo alone at this stage. The goal is to control her disease, prolong her life, and give her good quality of life. She understands -she is full code now    8. CKD Stage III -Scr previously stable at 1.3 - 1.6 -worsened prior to Y90 procedure, reaching 2.25 the day of procedure. Today creatinine is 1.62 (05/14/21).     PLAN: -will talk to Dr. Pascal Lux, if her next Y90 is more than a month out, will restart her on Ivosidenib and hold again 2 weeks before next Y90 -lab, flush, and f/u in 3-4 weeks, pt prefers close f/u    No problem-specific Assessment & Plan notes found for this encounter.   SUMMARY OF ONCOLOGIC HISTORY: Oncology History Overview Note  Cancer Staging Intrahepatic cholangiocarcinoma ([place]) Staging form: Intrahepatic Bile Duct, AJCC 8th Edition - Clinical stage from 09/23/2018: Stage IB (cT1b, cN0, cM0) - Signed by [name], MD on 10/06/2018 Histologic grade (G): G3 Histologic grading system: 3 grade system    Intrahepatic cholangiocarcinoma ([place])  09/07/2018 Imaging   CT Chest IMPRESSION: 1. New, enhancing mass involving segment 4 of the liver and fundus of gallbladder is concerning for malignancy. This may represent either metastatic disease from breast cancer or neoplasm primary to the liver or hepatic biliary  tree. Further evaluation with contrast enhanced CT of the abdomen and pelvis is recommended. 2. No findings to suggest metastatic disease within the chest. 3.  Aortic Atherosclerosis (ICD10-I70.0). 4. Coronary artery calcifications.   09/13/2018 Pathology Results   Diagnosis Liver, needle/core biopsy - ADENOCARCINOMA. Microscopic Comment Immunohistochemistry for CK7 is positive. CK20, TTF1, CDX-2, GATA-3, PAX 8, Qualitative ER, p63 and CK5/6 are negative. The provided clinical history of remote mammary carcinoma is noted. Based on the morphology and immunophenotype of the adenocarcinoma observed in this specimen, primary cholangiocarcinoma is favored. Clinical and radiologic correlation are  encouraged. Results reported to [employer] on 09/15/2018. Intradepartmental consultation (Dr. Vic Ripper).   09/13/2018 Initial Diagnosis   Cholangiocarcinoma ([place])   09/23/2018 Procedure   Colonoscopy by Dr. Havery Moros 09/23/18  IMPRESSION - Two 3 to 4 mm polyps in the ascending colon, removed with a cold snare. Resected and retrieved. - Five 3 to 5 mm polyps in the transverse colon, removed with a cold snare. Resected and retrieved. - One 5 mm polyp at the splenic flexure, removed with a cold snare. Resected and retrieved. - Three 3 to 5 mm polyps in the sigmoid colon, removed with a cold snare. Resected and retrieved. - The examination was otherwise normal. Upper Endopscy by Dr. Havery Moros 09/23/18  IMPRESSION - Esophagogastric landmarks identified. - 2 cm hiatal hernia. - Normal esophagus otherwise. - A single gastric polyp. Resected and retrieved. - Mild gastritis. Biopsied. -  Normal duodenal bulb and second portion of the duodenum.   09/23/2018 Pathology Results   Diagnosis 09/23/18 1. Surgical [P], duodenum - BENIGN SMALL BOWEL MUCOSA. - NO ACTIVE INFLAMMATION OR VILLOUS ATROPHY IDENTIFIED. 2. Surgical [P], stomach, polyp - HYPERPLASTIC POLYP(S). - THERE IS NO EVIDENCE OF  MALIGNANCY. 3. Surgical [P], gastric antrum and gastric body - CHRONIC INACTIVE GASTRITIS. - THERE IS NO EVIDENCE OF HELICOBACTER-PYLORI, DYSPLASIA, OR MALIGNANCY. - SEE COMMENT. 4. Surgical [P], colon, sigmoid, splenic flexure, transverse and ascending, polyp (9) - TUBULAR ADENOMA(S). - SESSILE SERRATED POLYP WITHOUT CYTOLOGIC DYSPLASIA. - HIGH GRADE DYSPLASIA IS NOT IDENTIFIED. 5. Surgical [P], colon, sigmoid, polyp (2) - HYPERPLASTIC POLYP(S). - THERE IS NO EVIDENCE OF MALIGNANCY.   09/23/2018 Cancer Staging   Staging form: Intrahepatic Bile Duct, AJCC 8th Edition - Clinical stage from 09/23/2018: Stage IB (cT1b, cN0, cM0) - Signed by [name], MD on 10/06/2018    09/29/2018 PET scan   PET 09/29/18 IMPRESSION: 1. Hypermetabolic mass in the RIGHT hepatic lobe consistent with biopsy proven adenocarcinoma. No additional liver metastasis. 2. No evidence of local breast cancer recurrence in the RIGHT breast or RIGHT axilla. 3. Mild bilateral hypermetabolic adrenal glands is favored benign hyperplasia. 4. No evidence of additional metastatic disease on skull base to thigh FDG PET scan.   11/06/2018 Genetic Testing   Negative genetic testing on the [facility] Common Hereditary Cancers panel. A variant of uncertain significance was identified in one of her APC genes, called c.1243G>A (p.Ala415Thr).  The Common Hereditary Cancers Panel offered by [facility] includes sequencing and/or deletion duplication testing of the following 48 genes: APC, ATM, AXIN2, BARD1, BMPR1A, BRCA1, BRCA2, BRIP1, CDH1, CDK4, CDKN2A (p14ARF), CDKN2A (p16INK4a), CHEK2, CTNNA1, DICER1, EPCAM (Deletion/duplication testing only), GREM1 (promoter region deletion/duplication testing only), KIT, MEN1, MLH1, MSH2, MSH3, MSH6, MUTYH, NBN, NF1, NHTL1, PALB2, PDGFRA, PMS2, POLD1, POLE, PTEN, RAD50, RAD51C, RAD51D, RNF43, SDHB, SDHC, SDHD, SMAD4, SMARCA4. STK11, TP53, TSC1, TSC2, and VHL.  The following genes were evaluated for  sequence changes only: SDHA and HOXB13 c.251G>A variant only.    11/17/2018 Pathology Results   Diagnosis 11/17/18 1. Soft tissue, biopsy, Diaphragmatic nodules - METASTATIC ADENOCARCINOMA, CONSISTENT WITH PATIENT'S CLINICAL HISTORY OF CHOLANGIOCARCINOMA. SEE NOTE 2. Liver, biopsy, Left - LIVER PARENCHYMA WITH A BENIGN FIBROTIC NODULE - NO EVIDENCE OF MALIGNANCY 3. Stomach, biopsy - BENIGN PAPILLARY MESOTHELIAL HYPERPLASIA - NO EVIDENCE OF MALIGNANCY   11/29/2018 Imaging   CT CAP WO Contrast  IMPRESSION: 1. Dominant liver mass appears grossly stable from 09/29/2018. Additional liver lesions are too small to characterize but were not shown to be hypermetabolic on PET. 2. Mild nodularity of both adrenal glands with associated hypermetabolism on 09/29/2018. Continued attention on follow-up exams is warranted. 3. Small right lower lobe nodules, stable from 09/07/2018. Again, attention on follow-up is recommended. 4. Trace bilateral pleural fluid. 5. Aortic atherosclerosis (ICD10-170.0). Coronary artery calcification. 6. Enlarged pulmonic trunk, indicative of pulmonary arterial hypertension.     11/30/2018 - 06/14/2019 Chemotherapy   First line chemo Oxaliplatin and gemcitabine q2weeks starting 11/30/18. Stopped Oxaliplatin on 05/03/19 due to worsening neuropathy. Added Cisplatin with C13 on 05/17/19 and stopped on C15 06/14/19 due to neuropathy. Reduced to maintenance single agent Gemcitabine on 06/28/19.    01/20/2019 Imaging   CT CAP IMPRESSION: restaging  1. Mild interval increase in size of dominant liver mass involving segment 4 and segment 5. 2. No significant or progressive adrenal nodularity identified to suggest metastatic disease. 3. Unchanged appearance of small right lower lobe and  lingular lung nodules, nonspecific.   03/21/2019 PET scan   IMPRESSION: 1. Large right hepatic mass with SUV uptake near background hepatic activity, dramatic response to therapy, also with decrease  in size when compared to the prior study. 2. Signs of prior right mastectomy and axillary dissection as before. 3. Left adrenal activity in no longer above the level of activity seen in the contralateral, right adrenal gland or in the liver. Attention on follow-up. 4. No new signs of disease.   06/27/2019 PET scan   IMPRESSION: 1. Further decrease in size of a dominant hepatic mass which is non FDG avid. 2. No evidence of metastatic disease. 3. No evidence of left adrenal hypermetabolism or mass. 4. Incidental findings, including: Uterine fibroids. Coronary artery atherosclerosis. Aortic Atherosclerosis (ICD10-I70.0). Pulmonary artery enlargement suggests pulmonary arterial hypertension.   06/28/2019 -  Chemotherapy   Maintenance single agent Gemcitabine q2weeks starting on 06/28/19 with C16.  ------On chemo break since 12/27/19.  ------She restarted Single Agent Gemcitabine q2weeks on 04/03/20 due to mild disease progression in liver on 02/2020 PET    09/15/2019 PET scan   IMPRESSION: 1. In the region of regional tumor at the junction of the right and left hepatic lobe, there is continued hypodensity and overall activity slightly less than that of the surrounding normal liver, indicating effective response to therapy. No current worrisome hypermetabolic lesion is identified in the liver or elsewhere. 2. Chronic asymmetric edema in the subcutaneous tissues of the right upper extremity, nonspecific. The patient has had a prior right mastectomy and right axillary dissection. 3. Other imaging findings of potential clinical significance: Aortic Atherosclerosis (ICD10-I70.0). Coronary atherosclerosis. Mild cardiomegaly. Small right and trace left pleural effusions. Subcutaneous and mild mesenteric edema. Suspected uterine fibroids.   12/18/2019 PET scan   IMPRESSION: 1. No significant abnormal activity to suggest active/recurrent malignancy. 2. Lingual tonsillar activity is mildly  increased from prior but probably incidental/physiologic, attention on follow up suggested. 3. Other imaging findings of potential clinical significance: Third spacing of fluid with trace ascites, mesenteric edema, subcutaneous edema, and possibly subtle pulmonary edema. Mild cardiomegaly. Aortic Atherosclerosis (ICD10-I70.0). Coronary atherosclerosis. Right ovarian dermoid. Calcified uterine fibroids. Renal cysts. Right glenohumeral arthropathy. Mild to moderate scoliosis. Suspected pulmonary arterial hypertension.   03/14/2020 PET scan   IMPRESSION: 1. New small focus of hypermetabolism in segment 4A of the liver worrisome for new metastatic focus. 2. No other findings for metastatic disease involving the neck, chest, abdomen or pelvis.     06/11/2020 PET scan   IMPRESSION: 1. No suspicious hypermetabolic activity within the neck, chest, abdomen or pelvis. 2. The treated dominant peripheral cholangiocarcinoma appears slightly enlarged compared with recent prior studies, and there is a questionable new lesion more superiorly in the left lobe. Although without associated hypermetabolic activity, these lesions are suspicious and would be better assessed with abdominal MRI without and with contrast.   09/17/2020 PET scan   IMPRESSION: 1. Recurrent hypermetabolic activity around the periphery of the dominant hepatic mass consistent with local recurrence. This mass appears slightly larger. Possible small metastasis in the dome of the left hepatic lobe is unchanged in size, without hypermetabolic activity. 2. No distant metastases identified. 3. Stable incidental findings as detailed above.   10/16/2020 -  Chemotherapy   Patient is on Treatment Plan : BLADDER Durvalumab q28d     01/02/2021 PET scan   IMPRESSION: 1. Further enlargement of hypermetabolic hepatic mass superior to the cholecystectomy bed consistent with progressive local tumor recurrence. Satellite lesion more superiorly  in  the anterior dome of the liver has also enlarged, without definite hypermetabolic activity. 2. No distant metastases identified. 3. Stable incidental findings including aortic atherosclerosis, hepatic and renal cysts and uterine fibroids.      INTERVAL HISTORY:  Donna May is here for a follow up of cholangiocarcinoma. She was last seen by me on 04/09/21. She presents to the clinic alone. She reports she tolerated Y90 well with no pain or other side effects.  She reports she feels her neuropathy is worse and endorses using gabapentin as needed. "I think I have one of the worst cases of neuropathy anyone's ever had." She reports the numbness/tingling is in her feet, her hands, and her arms.  She also reports no sense of taste.   All other systems were reviewed with the patient and are negative.  MEDICAL HISTORY:  Past Medical History:  Diagnosis Date   Anemia    Anxiety    Arthritis    Breast cancer ([place]) 1992   right   Cataract    Bilateral eyes - surgery to remove   Chronic diastolic CHF (congestive heart failure) ([place])    Chronic kidney disease    CKD stage 3   Complication of anesthesia    "something they use make me itch for a couple of days."   Depression    Diabetes mellitus    type 2   Duodenitis 01/18/2002   Fainting spell    GERD (gastroesophageal reflux disease)    Heart murmur    never has caused any problems   Hiatal hernia 08/08/2008, 01/18/2002   History of pneumonia    x 2   Hyperlipidemia    Hypertension    Liver cancer ([place]) 08/2018   chemotherapy   Lymphedema 2017   Right arm   Peripheral neuropathy     SURGICAL HISTORY: Past Surgical History:  Procedure Laterality Date   AXILLARY SURGERY     cyst removal, right   COLONOSCOPY  09/23/2018   Dr. Havery Moros - polyps   EYE SURGERY Bilateral    cataracts to remove   IR 3D INDEPENDENT WKST  05/02/2021   IR ANGIOGRAM SELECTIVE EACH ADDITIONAL VESSEL  04/18/2021   IR ANGIOGRAM SELECTIVE EACH  ADDITIONAL VESSEL  04/18/2021   IR ANGIOGRAM SELECTIVE EACH ADDITIONAL VESSEL  05/02/2021   IR ANGIOGRAM SELECTIVE EACH ADDITIONAL VESSEL  05/02/2021   IR ANGIOGRAM SELECTIVE EACH ADDITIONAL VESSEL  05/02/2021   IR ANGIOGRAM SELECTIVE EACH ADDITIONAL VESSEL  05/02/2021   IR ANGIOGRAM SELECTIVE EACH ADDITIONAL VESSEL  05/02/2021   IR ANGIOGRAM VISCERAL SELECTIVE  04/18/2021   IR ANGIOGRAM VISCERAL SELECTIVE  05/02/2021   IR EMBO TUMOR ORGAN ISCHEMIA INFARCT INC GUIDE ROADMAPPING  05/02/2021   IR RADIOLOGIST EVAL & MGMT  01/30/2021   IR US GUIDE VASC ACCESS RIGHT  04/18/2021   IR US GUIDE VASC ACCESS RIGHT  05/02/2021   LAPAROSCOPY N/A 11/17/2018   Procedure: LAPAROSCOPY DIAGNOSTIC, INTRAOPERATIVE ULTRASOUND, PERITONEAL BIOPSIES;  Surgeon: [name], MD;  Location: [facility];  Service: General;  Laterality: N/A;  GENERAL AND EPIDURAL   LIVER BIOPSY  08/2018   Dr. Lindwood Coke   MASTECTOMY  1992   right, with flap   NM [facility]  06/11/2009   Protocol:[place], post stress EF58%, EKG negative for ischemia, low risk   PORTACATH PLACEMENT N/A 12/02/2018   Procedure: INSERTION PORT-A-CATH;  Surgeon: [name], MD;  Location: [facility];  Service: General;  Laterality: N/A;   RECONSTRUCTION BREAST W/  TRAM FLAP Right    TONSILLECTOMY  1958   TRANSTHORACIC ECHOCARDIOGRAM  12/24/2009   LVEF =>55%, normal study   UPPER GI ENDOSCOPY      I have reviewed the social history and family history with the patient and they are unchanged from previous note.  ALLERGIES:  has No Known Allergies.  MEDICATIONS:  Current Outpatient Medications  Medication Sig Dispense Refill   amLODipine (NORVASC) 10 MG tablet Take 1 tablet (10 mg total) by mouth daily. 90 tablet 1   aspirin 81 MG tablet Take 81 mg by mouth daily.      Baclofen 5 MG TABS TAKE 1/2 TABLET BY MOUTH ONCE DAILY AS NEEDED 42 tablet 1   carvedilol (COREG) 25 MG tablet Take 1 tablet (25 mg total) by mouth 2 (two) times daily with a meal. 180 tablet 3    furosemide (LASIX) 40 MG tablet Take 1 tablet (40 mg total) by mouth daily. 90 tablet 1   gabapentin (NEURONTIN) 100 MG capsule Take 1 capsule (100 mg total) by mouth every 8 (eight) hours as needed. 180 capsule 2   glucose blood test strip 1 each by Other route 2 (two) times daily. Use Onetouch verio test strips as instructed to check blood sugar twice daily. 100 each 2   hydrALAZINE (APRESOLINE) 100 MG tablet TAKE THREE TIMES DAILY-AM, MIDDLE OF THE DAY AND IN THE EVENING. 270 tablet 3   Ivosidenib (TIBSOVO) 250 MG TABS Take 2 tablets (517m) by mouth daily. 60 tablet 2   lansoprazole (PREVACID) 15 MG capsule Take 1 capsule (15 mg total) by mouth daily at 12 noon. Take 1 capsule by mouth once or twice a day as needed 90 capsule 3   lidocaine-prilocaine (EMLA) cream Apply 1 application topically as needed. 30 g 1   loratadine (CLARITIN) 10 MG tablet Take 10 mg by mouth daily as needed for allergies.     Omega 3 1000 MG CAPS Take 2,000 mg by mouth daily.      oxyCODONE (ROXICODONE) 5 MG immediate release tablet Take 1 tablet (5 mg total) by mouth every 4 (four) hours as needed for severe pain. 20 tablet 0   polyethylene glycol (MIRALAX / GLYCOLAX) 17 g packet Take 17 g by mouth daily as needed. 14 each 0   potassium chloride (KLOR-CON M10) 10 MEQ tablet Take 1 tablet (10 mEq total) by mouth daily. 90 tablet 1   prochlorperazine (COMPAZINE) 10 MG tablet Take 1 tablet (10 mg total) by mouth every 6 (six) hours as needed for nausea or vomiting. 30 tablet 3   rosuvastatin (CRESTOR) 20 MG tablet Take 1 tablet (20 mg total) by mouth daily. 90 tablet 3   sitaGLIPtin (JANUVIA) 100 MG tablet Take 1 tablet (100 mg total) by mouth daily. 90 tablet 3   valsartan (DIOVAN) 320 MG tablet Take 1 tablet (320 mg total) by mouth daily. 90 tablet 3   Current Facility-Administered Medications  Medication Dose Route Frequency Provider Last Rate Last Admin   triamcinolone acetonide (KENALOG) 10 MG/ML injection 10 mg  10  mg Other Once SHarriet Masson DPM        PHYSICAL EXAMINATION: ECOG PERFORMANCE STATUS: 2 - Symptomatic, <50% confined to bed  Vitals:   05/14/21 0925  BP: (!) 152/58  Pulse: 61  Resp: 18  Temp: 98.2 F (36.8 C)  SpO2: 100%   Wt Readings from Last 3 Encounters:  05/14/21 131 lb 9.6 oz (59.7 kg)  05/02/21 130 lb (59 kg)  04/09/21  129 lb 8 oz (58.7 kg)     GENERAL:alert, no distress and comfortable SKIN: skin color normal, no rashes or significant lesions EYES: normal, Conjunctiva are pink and non-injected, sclera clear  NEURO: alert & oriented x 3 with fluent speech  LABORATORY DATA:  I have reviewed the data as listed CBC Latest Ref Rng & Units 05/14/2021 05/02/2021 04/18/2021  WBC 4.0 - 10.5 K/uL 5.9 6.4 5.9  Hemoglobin 12.0 - 15.0 g/dL 10.3(L) 11.0(L) 11.2(L)  Hematocrit 36.0 - 46.0 % 32.6(L) 34.4(L) 34.9(L)  Platelets 150 - 400 K/uL 236 185 205     CMP Latest Ref Rng & Units 05/14/2021 05/02/2021 04/18/2021  Glucose 70 - 99 mg/dL 145(H) 101(H) 89  BUN 8 - 23 mg/dL 39(H) 60(H) 55(H)  Creatinine 0.44 - 1.00 mg/dL 1.62(H) 2.25(H) 1.91(H)  Sodium 135 - 145 mmol/L 140 137 137  Potassium 3.5 - 5.1 mmol/L 4.3 4.2 4.4  Chloride 98 - 111 mmol/L 106 105 106  CO2 22 - 32 mmol/L _0 Calcium 8.9 - 10.3 mg/dL 8.9 8.7(L) 8.9  Total Protein 6.5 - 8.1 g/dL 6.9 7.3 7.2  Total Bilirubin 0.3 - 1.2 mg/dL 0.3 0.2(L) 0.5  Alkaline Phos 38 - 126 U/L 76 73 74  AST 15 - 41 U/L _1 ALT 0 - 44 U/L _2 RADIOGRAPHIC STUDIES: I have personally reviewed the radiological images as listed and agreed with the findings in the report. No results found.    No orders of the defined types were placed in this encounter.  All questions were answered. The patient knows to call the clinic with any problems, questions or concerns. No barriers to learning was detected. The total time spent in the appointment was 30 minutes.     [name], MD 05/14/2021   I, Wilburn Mylar, am  acting as scribe for [name], MD.   I have reviewed the above documentation for accuracy and completeness, and I agree with the above.

## 2021-05-21 DIAGNOSIS — Z20828 Contact with and (suspected) exposure to other viral communicable diseases: Secondary | ICD-10-CM | POA: Diagnosis not present

## 2021-05-22 ENCOUNTER — Ambulatory Visit
Admission: RE | Admit: 2021-05-22 | Discharge: 2021-05-22 | Disposition: A | Discharge status: Home / Self Care | Payer: Medicare Other | Source: Ambulatory Visit | Attending: Interventional Radiology | Admitting: Interventional Radiology

## 2021-05-22 ENCOUNTER — Other Ambulatory Visit: Payer: Self-pay | Admitting: *Deleted

## 2021-05-22 ENCOUNTER — Encounter: Payer: Self-pay | Admitting: *Deleted

## 2021-05-22 DIAGNOSIS — C221 Intrahepatic bile duct carcinoma: Secondary | ICD-10-CM | POA: Diagnosis not present

## 2021-05-22 DIAGNOSIS — Z9889 Other specified postprocedural states: Secondary | ICD-10-CM | POA: Diagnosis not present

## 2021-05-22 HISTORY — PX: IR RADIOLOGIST EVAL & MGMT: IMG5224

## 2021-05-22 NOTE — Progress Notes (Signed)
Patient ID: Donna May, female   DOB: 18-Aug-1940, 81 y.o.   MRN: [id number]        Chief Complaint: Intrahepatic cholangiocarcinoma   Referring Physician(s): Feng,Yan   History of Present Illness: Donna May is a 81 y.o. female with past medical history significant for chronic kidney disease, diabetes, hyperlipidemia, hypertension, breast cancer, peripheral neuropathy, chronic diastolic CHF, who presents today to the interventional radiology clinic post Y90 bland/radioembolization for progressive intrahepatic cholangiocarcinoma.   In brief review, patient was diagnosed with cholangiocarcinoma on liver biopsy performed 10/03/2018.  She initially underwent attempted surgical resection however was found to have peritoneal metastasis at the time of the surgery and as such the surgery was aborted.  Since that time, the patient has undergone several rounds of systemic therapy, most recently gemcitabine with durvalumab, which overall she tolerated well except for persistent neuropathy primarily affecting her hands and lower legs.   Unfortunately, PET/CT performed 01/02/2021 demonstrates progression of disease, though fortunately, the disease appears limited to the liver and as such she was evaluated by the interventional radiology service on 01/20/2021, ultimately undergoing mapping Y90 radioembolization on 04/18/2021 and subsequent bland embolization of a replaced left hepatic artery arising from the left gastric artery and right hepatic artery Y90 radioembolization on 05/02/2021.  Note, initial plan was to perform Y90 radioembolization of the conventional left hepatic artery however this ultimately proved unsuccessful secondary to inability to cannulate this vessel with a microcatheter.  The patient is seen today in clinic for postprocedural evaluation and management.  Patient states that she initially experienced some mild epigastric abdominal pain however this resolved after approximately 2 to 3  days.  She otherwise tolerated procedure well with no significant change in appetite or energy level.  Patient states she is in her baseline state of health.  Past Medical History:  Diagnosis Date   Anemia    Anxiety    Arthritis    Breast cancer ([place]) 1992   right   Cataract    Bilateral eyes - surgery to remove   Chronic diastolic CHF (congestive heart failure) (HCC)    Chronic kidney disease    CKD stage 3   Complication of anesthesia    "something they use make me itch for a couple of days."   Depression    Diabetes mellitus    type 2   Duodenitis 01/18/2002   Fainting spell    GERD (gastroesophageal reflux disease)    Heart murmur    never has caused any problems   Hiatal hernia 08/08/2008, 01/18/2002   History of pneumonia    x 2   Hyperlipidemia    Hypertension    Liver cancer ([place]) 08/2018   chemotherapy   Lymphedema 2017   Right arm   Peripheral neuropathy     Past Surgical History:  Procedure Laterality Date   AXILLARY SURGERY     cyst removal, right   COLONOSCOPY  09/23/2018   Dr. Havery Moros - polyps   EYE SURGERY Bilateral    cataracts to remove   IR 3D INDEPENDENT WKST  05/02/2021   IR ANGIOGRAM SELECTIVE EACH ADDITIONAL VESSEL  04/18/2021   IR ANGIOGRAM SELECTIVE EACH ADDITIONAL VESSEL  04/18/2021   IR ANGIOGRAM SELECTIVE EACH ADDITIONAL VESSEL  05/02/2021   IR ANGIOGRAM SELECTIVE EACH ADDITIONAL VESSEL  05/02/2021   IR ANGIOGRAM SELECTIVE EACH ADDITIONAL VESSEL  05/02/2021   IR ANGIOGRAM SELECTIVE EACH ADDITIONAL VESSEL  05/02/2021   IR ANGIOGRAM SELECTIVE EACH ADDITIONAL VESSEL  05/02/2021  IR ANGIOGRAM VISCERAL SELECTIVE  04/18/2021   IR ANGIOGRAM VISCERAL SELECTIVE  05/02/2021   IR EMBO TUMOR ORGAN ISCHEMIA INFARCT INC GUIDE ROADMAPPING  05/02/2021   IR RADIOLOGIST EVAL & MGMT  01/30/2021   IR US GUIDE VASC ACCESS RIGHT  04/18/2021   IR US GUIDE VASC ACCESS RIGHT  05/02/2021   LAPAROSCOPY N/A 11/17/2018   Procedure: LAPAROSCOPY DIAGNOSTIC, INTRAOPERATIVE  ULTRASOUND, PERITONEAL BIOPSIES;  Surgeon: [name], MD;  Location: [facility];  Service: General;  Laterality: N/A;  GENERAL AND EPIDURAL   LIVER BIOPSY  08/2018   Dr. Lindwood Coke   MASTECTOMY  1992   right, with flap   NM [place]  06/11/2009   Protocol:[place], post stress EF58%, EKG negative for ischemia, low risk   PORTACATH PLACEMENT N/A 12/02/2018   Procedure: INSERTION PORT-A-CATH;  Surgeon: [name], MD;  Location: [facility];  Service: General;  Laterality: N/A;   RECONSTRUCTION BREAST W/ TRAM FLAP Right    TONSILLECTOMY  1958   TRANSTHORACIC ECHOCARDIOGRAM  12/24/2009   LVEF =>55%, normal study   UPPER GI ENDOSCOPY      Allergies: Patient has no known allergies.  Medications: Prior to Admission medications   Medication Sig Start Date End Date Taking? Authorizing Provider  amLODipine (NORVASC) 10 MG tablet Take 1 tablet (10 mg total) by mouth daily. 03/28/21   Deberah Pelton, NP  aspirin 81 MG tablet Take 81 mg by mouth daily.     [provider]  Baclofen 5 MG TABS TAKE 1/2 TABLET BY MOUTH ONCE DAILY AS NEEDED 12/20/20   [name], MD  carvedilol (COREG) 25 MG tablet Take 1 tablet (25 mg total) by mouth 2 (two) times daily with a meal. 04/16/21   [name], Mihai, MD  furosemide (LASIX) 40 MG tablet Take 1 tablet (40 mg total) by mouth daily. 03/28/21   Deberah Pelton, NP  gabapentin (NEURONTIN) 100 MG capsule Take 1 capsule (100 mg total) by mouth every 8 (eight) hours as needed. 05/15/20   [name], MD  glucose blood test strip 1 each by Other route 2 (two) times daily. Use Onetouch verio test strips as instructed to check blood sugar twice daily. 01/05/19   Elayne Snare, MD  hydrALAZINE (APRESOLINE) 100 MG tablet TAKE THREE TIMES DAILY-AM, MIDDLE OF THE DAY AND IN THE EVENING. 09/13/20   [name], Mihai, MD  Ivosidenib (TIBSOVO) 250 MG TABS Take 2 tablets (542m) by mouth daily. 05/08/21   FTruitt Merle MD  lansoprazole (PREVACID) 15 MG capsule Take 1 capsule (15  mg total) by mouth daily at 12 noon. Take 1 capsule by mouth once or twice a day as needed 11/01/20   [name], SCarlota Raspberry MD  lidocaine-prilocaine (EMLA) cream Apply 1 application topically as needed. 08/21/20   FTruitt Merle MD  loratadine (CLARITIN) 10 MG tablet Take 10 mg by mouth daily as needed for allergies.    [provider]  Omega 3 1000 MG CAPS Take 2,000 mg by mouth daily.     [provider]  oxyCODONE (ROXICODONE) 5 MG immediate release tablet Take 1 tablet (5 mg total) by mouth every 4 (four) hours as needed for severe pain. 05/02/21   CTheresa Duty NP  polyethylene glycol (MIRALAX / GLYCOLAX) 17 g packet Take 17 g by mouth daily as needed. 11/01/20   [name], SCarlota Raspberry MD  potassium chloride (KLOR-CON M10) 10 MEQ tablet Take 1 tablet (10 mEq total) by mouth daily. 03/28/21   CColetta Memos  M, NP  prochlorperazine (COMPAZINE) 10 MG tablet Take 1 tablet (10 mg total) by mouth every 6 (six) hours as needed for nausea or vomiting. 03/25/21   [name], MD  rosuvastatin (CRESTOR) 20 MG tablet Take 1 tablet (20 mg total) by mouth daily. 04/02/21 07/01/21  Deberah Pelton, NP  sitaGLIPtin (JANUVIA) 100 MG tablet Take 1 tablet (100 mg total) by mouth daily. 04/08/20   [name], MD  valsartan (DIOVAN) 320 MG tablet Take 1 tablet (320 mg total) by mouth daily. 02/07/20   Deberah Pelton, NP     Family History  Problem Relation Age of Onset   Breast cancer Cousin        diagnosed >50; mother's first cousins   Diabetes Brother    Heart disease Brother    Heart disease Mother    Hypertension Mother    Multiple sclerosis Mother    Heart disease Father    Hypertension Father    Stroke Father    Prostate cancer Brother 43   Heart attack Maternal Grandmother    Stroke Maternal Grandfather    Colon cancer Neg Hx    Esophageal cancer Neg Hx    Stomach cancer Neg Hx    Rectal cancer Neg Hx     Social History   Socioeconomic History   Marital status: Single     Spouse name: Not on file   Number of children: 2   Years of education: 12   Highest education level: Not on file  Occupational History   Occupation: retired    Comment: disabled  Tobacco Use   Smoking status: Former    Packs/day: 0.25    Years: 25.00    Pack years: 6.25    Types: Cigarettes    Quit date: 1990    Years since quitting: 33.2   Smokeless tobacco: Never  Vaping Use   Vaping Use: Never used  Substance and Sexual Activity   Alcohol use: Not Currently    Alcohol/week: 0.0 standard drinks   Drug use: Never   Sexual activity: Yes    Partners: Male    Birth control/protection: Condom, Post-menopausal    Comment: First sexual encounter age 89. Fewer than 5 partners in life time.  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   Has had 3 Covid vaccines   Social Determinants of Radio broadcast assistant Strain: Not on file  Food Insecurity: Not on file  Transportation Needs: Not on file  Physical Activity: Not on file  Stress: Not on file  Social Connections: Not on file    ECOG Status: 1 - Symptomatic but completely ambulatory  Review of Systems: A 12 point ROS discussed and pertinent positives are indicated in the HPI above.  All other systems are negative.  Review of Systems  Vital Signs: LMP  (LMP Unknown)   Physical Exam  Mallampati Score:   Imaging: NM LIVER TUMOR LOC IMFLAM SPECT 1 DAY  Result Date: 05/02/2021 CLINICAL DATA:  Unresectable cholangiocarcinoma within the central liver. Yttrium 90 microsphere radio embolization treatment EXAM: NUCLEAR MEDICINE SPECIAL MED RAD PHYSICS CONS; NUCLEAR MEDICINE RADIO PHARM THERAPY INTRA ARTERIAL; NUCLEAR MEDICINE TREATMENT PROCEDURE; NUCLEAR MEDICINE LIVER SCAN TECHNIQUE: In  conjunction  with the interventional radiologist a Y- Microsphere dose was calculated utilizing body surface area formulation and partition formula. 3D post processing volume calculations applied. Calculated dose equal 17.3 mCi. Pre therapy MAA liver SPECT scan and CTA were evaluated. Utilizing a microcatheter system, the hepatic artery was selected and Y-90 microspheres were delivered in fractionated aliquots. Radiopharmaceutical was delivered by the interventional radiologist and nuclear radiologist. The patient tolerated procedure well. No adverse effects were noted. Bremsstrahlung planar and SPECT imaging of the abdomen following intrahepatic arterial delivery of Y-90 microsphere was performed. RADIOPHARMACEUTICALS:  35.70 millicuries yttrium 90 microspheres COMPARISON:  MAA scan 04/18/2021, angiography 04/18/2021, CT 10/15/2021 22 FINDINGS: Y - 90 microspheres therapy as above. First therapy the right hepatic lobe. Bremsstrahlung planar and SPECT imaging of the abdomen following intrahepatic arterial delivery of Y-1mcrosphere demonstrates radioactivity localized to the RIGHT and central LEFT hepatic lobe. No evidence of extrahepatic activity. IMPRESSION: Successful Y - 90 microsphere delivery for treatment of unresectable liver metastasis. First therapy to the RIGHT lobe. Bremssstrahlung scan demonstrates activity localized to RIGHT hepatic lobe and central LEFT hepatic lobe. Focal accumulation within the neoplasm identified. No extrahepatic activity identified. Electronically Signed   By: SSuzy BouchardM.D.   On: 05/02/2021 14:28   IR Angiogram Visceral Selective  Result Date: 05/02/2021 INDICATION: Cholangiocarcinoma. Patient presents today for bland embolization of an accessory left hepatic artery arising from the left gastric artery as well as Y 90 radioembolization of either the right or left hepatic arteries. EXAM: 1. SELECTIVE CELIAC ARTERIOGRAM 2. SPLENIC ARTERIOGRAM 3. SUB SELECTIVE LEFT  GASTRIC ARTERIOGRAM 4. SUB SELECTIVE ACCESSORY LEFT HEPATIC ARTERIOGRAM AND PERCUTANEOUS PARTICLE EMBOLIZATION 5. SELECTIVE PROPER HEPATIC ARTERIOGRAM 6. ATTEMPTED THOUGH ULTIMATELY UNSUCCESSFUL CATHETERIZATION OF THE LEFT HEPATIC ARTERY 7. SUB SELECTIVE RIGHT HEPATIC ARTERIOGRAM AND TRANSCATHETER YTTRIUM-90 RADIOEMBOLIZATION. 8. FLUOROSCOPIC GUIDED ADMINISTRATION OF SIR-SPHERES 9. ULTRASOUND GUIDED VASCULAR ACCESS OF THE RIGHT COMMON FEMORAL ARTERY COMPARISON:  Mapping Y 90 radioembolization and postprocedural nuclear medicine imaging-04/18/2021 CT abdomen pelvis-05/08/2020 MEDICATIONS: Protonix 40 mg IV; Decadron 20 mg IV; Toradol 30 mg IV; Zofran 4 mg IV CONTRAST:  90 cc Omnipaque 300 ANESTHESIA/SEDATION: Moderate (conscious) sedation was employed during this procedure as administered by the Interventional Radiology RN. A total of Versed 4 mg and Fentanyl 100 mcg was administered intravenously. Moderate Sedation Time: 120 minutes. The patient's level of consciousness and vital signs were monitored continuously by radiology nursing throughout the procedure under my direct supervision. FLUOROSCOPY TIME:  40 minutes, 36 seconds (1,334 mGy) ACCESS: Right common femoral artery; hemostasis achieved with manual compression. COMPLICATIONS: None immediate. TECHNIQUE: Informed written consent was obtained from the patient after a discussion of the risks, benefits and alternatives to treatment. Questions regarding the procedure were encouraged and answered. A timeout was performed prior to the initiation of the procedure. The right groin was prepped and draped in the usual sterile fashion, and a sterile drape was applied covering the operative field. Maximum barrier sterile technique with sterile gowns and gloves were used for the procedure. A timeout was performed prior to the initiation of the procedure. Local anesthesia was provided with 1% lidocaine. The right femoral head was marked fluoroscopically. Under direct  ultrasound guidance, the right common femoral artery was accessed with a micropuncture kit allowing placement of a of 5-French vascular sheath. An ultrasound image was saved for documentation purposes. A limited arteriogram performed through the side arm of the sheath confirming appropriate access within the right common femoral artery. Over a BBritta Mccreedywire, a Mickelson catheter was advanced to  the level of the inferior thoracic aorta where it was reformed, back bled and flushed. The Mickelson catheter was utilized to select the celiac artery and a celiac arteriogram was performed. A high-flow Progreat microcatheter was utilized to select the splenic artery and a selective splenic arteriogram was performed. With the use of a fathom 14 and ultimately a fathom 16 microwire, a high-flow Progreat microcatheter was utilized to select the left gastric artery and a selective left gastric arteriogram was performed Next, the Progreat microcatheter was advanced to the level of the accessory left hepatic artery and a sub selective left hepatic arteriogram was performed. From the approximate location of the vessel's main bifurcation, the accessory left hepatic artery was percutaneously particle embolized with 1 vial of 300-500 micron Embospheres. The microcatheter was retracted to the more proximal aspect of the accessory left hepatic artery and a post embolization arteriogram was performed Next, the microcatheter was utilized to select the proper hepatic artery and a selective proper hepatic arteriogram was performed. Prolonged efforts were made with a fathom 26, a fathom 6 and a GT glidewire to cannulate the left hepatic artery and while adequate wire purchase was achieved within the vessel, neither a Progreat or high-flow Renegade microcatheter could be catheterized within the vessel secondary to marked angulation and suspected narrowing of the vessel's origin. At this time, the decision was made to proceed with Y 90  radioembolization of the right lobe of the liver. The high-flow Renegade microcatheter was advanced to the main bifurcation of the right hepatic artery and a sub selective right hepatic arteriogram was performed. Radioembolization was then performed with Yttrium-90 SIR Spheres. Particles were administered via a microcatheter utilizing a completely enclosed system. Monitoring of antegrade flow was performed during and after the administration under fluoroscopy with use of contrast intermittently. After the administration of the particles, the microcatheter, outer catheter and administration system were then discarded into radiation safety receptacle. At this point, the procedure was terminated. The right common femoral approach vascular sheath was removed and hemostasis was achieved with manual compression. A dressing was applied. The patient tolerated procedure well without immediate postprocedural complication. All participants involved with the procedure were surveyed by the radiation safety officer prior to exiting the fluoroscopy suite. The patient was escorted to the nuclear medicine department for post-treatment imaging. FINDINGS: Despite a markedly tortuous origin, ultimately, the left gastric artery was successfully catheterized with sub selective injection demonstrating a large accessory left hepatic artery arising from its distal aspect. 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 As above, despite prolonged efforts, the left hepatic artery could NOT be catheterized despite adequate wire purchase secondary to marked tortuosity, angulation and potential narrowing of the vessel's origin. Successful Y 90 radioembolization of the right hepatic  artery at the level of the vessel's proximal bifurcation. Intermittent contrast injections during and after the radioembolization confirming preserved antegrade flow without reflux. IMPRESSION: 1. Technically successful percutaneous bland particle embolization of the accessory left hepatic artery arising from the left gastric artery. 2. Technically successful radioembolization of the right hepatic artery with Yttrium-90 microspheres. PLAN: Given inability to successfully catheterize the left hepatic artery as well as the patient's chronic renal insufficiency we will NOT pursue additional hepatic directed therapy at this time. Initial imaging (likely with PET-CT), we will be performed in conjunction with referring  oncologist, Dr. Burr Medico, in approximately 3 months (mid May 2023). Electronically Signed   By: Sandi Mariscal M.D.   On: 05/02/2021 17:27   IR Angiogram Selective Each Additional Vessel  Result Date: 05/02/2021 INDICATION: Cholangiocarcinoma. Patient presents today for bland embolization of an accessory left hepatic artery arising from the left gastric artery as well as Y 90 radioembolization of either the right or left hepatic arteries. EXAM: 1. SELECTIVE CELIAC ARTERIOGRAM 2. SPLENIC ARTERIOGRAM 3. SUB SELECTIVE LEFT GASTRIC ARTERIOGRAM 4. SUB SELECTIVE ACCESSORY LEFT HEPATIC ARTERIOGRAM AND PERCUTANEOUS PARTICLE EMBOLIZATION 5. SELECTIVE PROPER HEPATIC ARTERIOGRAM 6. ATTEMPTED THOUGH ULTIMATELY UNSUCCESSFUL CATHETERIZATION OF THE LEFT HEPATIC ARTERY 7. SUB SELECTIVE RIGHT HEPATIC ARTERIOGRAM AND TRANSCATHETER YTTRIUM-90 RADIOEMBOLIZATION. 8. FLUOROSCOPIC GUIDED ADMINISTRATION OF SIR-SPHERES 9. ULTRASOUND GUIDED VASCULAR ACCESS OF THE RIGHT COMMON FEMORAL ARTERY COMPARISON:  Mapping Y 90 radioembolization and postprocedural nuclear medicine imaging-04/18/2021 CT abdomen pelvis-05/08/2020 MEDICATIONS: Protonix 40 mg IV; Decadron 20 mg IV; Toradol 30 mg IV; Zofran 4 mg IV CONTRAST:  90 cc Omnipaque 300  ANESTHESIA/SEDATION: Moderate (conscious) sedation was employed during this procedure as administered by the Interventional Radiology RN. A total of Versed 4 mg and Fentanyl 100 mcg was administered intravenously. Moderate Sedation Time: 120 minutes. The patient's level of consciousness and vital signs were monitored continuously by radiology nursing throughout the procedure under my direct supervision. FLUOROSCOPY TIME:  40 minutes, 36 seconds (1,334 mGy) ACCESS: Right common femoral artery; hemostasis achieved with manual compression. COMPLICATIONS: None immediate. TECHNIQUE: Informed written consent was obtained from the patient after a discussion of the risks, benefits and alternatives to treatment. Questions regarding the procedure were encouraged and answered. A timeout was performed prior to the initiation of the procedure. The right groin was prepped and draped in the usual sterile fashion, and a sterile drape was applied covering the operative field. Maximum barrier sterile technique with sterile gowns and gloves were used for the procedure. A timeout was performed prior to the initiation of the procedure. Local anesthesia was provided with 1% lidocaine. The right femoral head was marked fluoroscopically. Under direct ultrasound guidance, the right common femoral artery was accessed with a micropuncture kit allowing placement of a of 5-French vascular sheath. An ultrasound image was saved for documentation purposes. A limited arteriogram performed through the side arm of the sheath confirming appropriate access within the right common femoral artery. Over a Britta Mccreedy wire, a Mickelson catheter was advanced to the level of the inferior thoracic aorta where it was reformed, back bled and flushed. The Mickelson catheter was utilized to select the celiac artery and a celiac arteriogram was performed. A high-flow Progreat microcatheter was utilized to select the splenic artery and a selective splenic arteriogram  was performed. With the use of a fathom 14 and ultimately a fathom 16 microwire, a high-flow Progreat microcatheter was utilized to select the left gastric artery and a selective left gastric arteriogram was performed Next, the Progreat microcatheter was advanced to the level of the accessory left hepatic artery and a sub selective left hepatic arteriogram was performed. From the approximate location of the vessel's main bifurcation, the accessory left hepatic artery was percutaneously particle embolized with 1 vial of 300-500 micron Embospheres. The microcatheter was retracted to the more proximal aspect of the accessory left hepatic artery and a post embolization arteriogram was performed Next, the microcatheter was utilized to select the proper hepatic artery and a selective proper hepatic arteriogram was performed. Prolonged efforts were made with a fathom 14, a fathom 16 and a  GT glidewire to cannulate the left hepatic artery and while adequate wire purchase was achieved within the vessel, neither a Progreat or high-flow Renegade microcatheter could be catheterized within the vessel secondary to marked angulation and suspected narrowing of the vessel's origin. At this time, the decision was made to proceed with Y 90 radioembolization of the right lobe of the liver. The high-flow Renegade microcatheter was advanced to the main bifurcation of the right hepatic artery and a sub selective right hepatic arteriogram was performed. Radioembolization was then performed with Yttrium-90 SIR Spheres. Particles were administered via a microcatheter utilizing a completely enclosed system. Monitoring of antegrade flow was performed during and after the administration under fluoroscopy with use of contrast intermittently. After the administration of the particles, the microcatheter, outer catheter and administration system were then discarded into radiation safety receptacle. At this point, the procedure was terminated. The  right common femoral approach vascular sheath was removed and hemostasis was achieved with manual compression. A dressing was applied. The patient tolerated procedure well without immediate postprocedural complication. All participants involved with the procedure were surveyed by the radiation safety officer prior to exiting the fluoroscopy suite. The patient was escorted to the nuclear medicine department for post-treatment imaging. FINDINGS: Despite a markedly tortuous origin, ultimately, the left gastric artery was successfully catheterized with sub selective injection demonstrating a large accessory left hepatic artery arising from its distal aspect. 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 As above, despite prolonged efforts, the left hepatic artery could NOT be catheterized despite adequate wire purchase secondary to marked tortuosity, angulation and potential narrowing of the vessel's origin. Successful Y 90 radioembolization of the right hepatic artery at the level of the vessel's proximal bifurcation. Intermittent contrast injections during and after the radioembolization confirming preserved antegrade flow without reflux. IMPRESSION: 1. Technically successful percutaneous bland particle embolization of the accessory left hepatic artery arising from the left gastric artery. 2. Technically successful radioembolization of the right hepatic artery with Yttrium-90 microspheres. PLAN: Given inability to successfully catheterize the left hepatic artery as well as the patient's chronic renal insufficiency we will NOT pursue additional hepatic directed therapy at this time. Initial imaging (likely with PET-CT), we will be performed in conjunction  with referring oncologist, Dr. Burr Medico, in approximately 3 months (mid May 2023). Electronically Signed   By: Sandi Mariscal M.D.   On: 05/02/2021 17:27   IR Angiogram Selective Each Additional Vessel  Result Date: 05/02/2021 INDICATION: Cholangiocarcinoma. Patient presents today for bland embolization of an accessory left hepatic artery arising from the left gastric artery as well as Y 90 radioembolization of either the right or left hepatic arteries. EXAM: 1. SELECTIVE CELIAC ARTERIOGRAM 2. SPLENIC ARTERIOGRAM 3. SUB SELECTIVE LEFT GASTRIC ARTERIOGRAM 4. SUB SELECTIVE ACCESSORY LEFT HEPATIC ARTERIOGRAM AND PERCUTANEOUS PARTICLE EMBOLIZATION 5. SELECTIVE PROPER HEPATIC ARTERIOGRAM 6. ATTEMPTED THOUGH ULTIMATELY UNSUCCESSFUL CATHETERIZATION OF THE LEFT HEPATIC ARTERY 7. SUB SELECTIVE RIGHT HEPATIC ARTERIOGRAM AND TRANSCATHETER YTTRIUM-90 RADIOEMBOLIZATION. 8. FLUOROSCOPIC GUIDED ADMINISTRATION OF SIR-SPHERES 9. ULTRASOUND GUIDED VASCULAR ACCESS OF THE RIGHT COMMON FEMORAL ARTERY COMPARISON:  Mapping Y 90 radioembolization and postprocedural nuclear medicine imaging-04/18/2021 CT abdomen pelvis-05/08/2020 MEDICATIONS: Protonix 40 mg IV; Decadron 20 mg IV; Toradol 30 mg IV; Zofran 4 mg IV CONTRAST:  90 cc Omnipaque 300 ANESTHESIA/SEDATION: Moderate (conscious) sedation was employed during this procedure as  administered by the Interventional Radiology RN. A total of Versed 4 mg and Fentanyl 100 mcg was administered intravenously. Moderate Sedation Time: 120 minutes. The patient's level of consciousness and vital signs were monitored continuously by radiology nursing throughout the procedure under my direct supervision. FLUOROSCOPY TIME:  40 minutes, 36 seconds (1,334 mGy) ACCESS: Right common femoral artery; hemostasis achieved with manual compression. COMPLICATIONS: None immediate. TECHNIQUE: Informed written consent was obtained from the patient after a discussion of the risks, benefits and alternatives to treatment.  Questions regarding the procedure were encouraged and answered. A timeout was performed prior to the initiation of the procedure. The right groin was prepped and draped in the usual sterile fashion, and a sterile drape was applied covering the operative field. Maximum barrier sterile technique with sterile gowns and gloves were used for the procedure. A timeout was performed prior to the initiation of the procedure. Local anesthesia was provided with 1% lidocaine. The right femoral head was marked fluoroscopically. Under direct ultrasound guidance, the right common femoral artery was accessed with a micropuncture kit allowing placement of a of 5-French vascular sheath. An ultrasound image was saved for documentation purposes. A limited arteriogram performed through the side arm of the sheath confirming appropriate access within the right common femoral artery. Over a Britta Mccreedy wire, a Mickelson catheter was advanced to the level of the inferior thoracic aorta where it was reformed, back bled and flushed. The Mickelson catheter was utilized to select the celiac artery and a celiac arteriogram was performed. A high-flow Progreat microcatheter was utilized to select the splenic artery and a selective splenic arteriogram was performed. With the use of a fathom 14 and ultimately a fathom 16 microwire, a high-flow Progreat microcatheter was utilized to select the left gastric artery and a selective left gastric arteriogram was performed Next, the Progreat microcatheter was advanced to the level of the accessory left hepatic artery and a sub selective left hepatic arteriogram was performed. From the approximate location of the vessel's main bifurcation, the accessory left hepatic artery was percutaneously particle embolized with 1 vial of 300-500 micron Embospheres. The microcatheter was retracted to the more proximal aspect of the accessory left hepatic artery and a post embolization arteriogram was performed Next, the  microcatheter was utilized to select the proper hepatic artery and a selective proper hepatic arteriogram was performed. Prolonged efforts were made with a fathom 35, a fathom 36 and a GT glidewire to cannulate the left hepatic artery and while adequate wire purchase was achieved within the vessel, neither a Progreat or high-flow Renegade microcatheter could be catheterized within the vessel secondary to marked angulation and suspected narrowing of the vessel's origin. At this time, the decision was made to proceed with Y 90 radioembolization of the right lobe of the liver. The high-flow Renegade microcatheter was advanced to the main bifurcation of the right hepatic artery and a sub selective right hepatic arteriogram was performed. Radioembolization was then performed with Yttrium-90 SIR Spheres. Particles were administered via a microcatheter utilizing a completely enclosed system. Monitoring of antegrade flow was performed during and after the administration under fluoroscopy with use of contrast intermittently. After the administration of the particles, the microcatheter, outer catheter and administration system were then discarded into radiation safety receptacle. At this point, the procedure was terminated. The right common femoral approach vascular sheath was removed and hemostasis was achieved with manual compression. A dressing was applied. The patient tolerated procedure well without immediate postprocedural complication. All participants involved with the procedure  were surveyed by the radiation safety officer prior to exiting the fluoroscopy suite. The patient was escorted to the nuclear medicine department for post-treatment imaging. FINDINGS: Despite a markedly tortuous origin, ultimately, the left gastric artery was successfully catheterized with sub selective injection demonstrating a large accessory left hepatic artery arising from its distal aspect. 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 As above, despite prolonged efforts, the left hepatic artery could NOT be catheterized despite adequate wire purchase secondary to marked tortuosity, angulation and potential narrowing of the vessel's origin. Successful Y 90 radioembolization of the right hepatic artery at the level of the vessel's proximal bifurcation. Intermittent contrast injections during and after the radioembolization confirming preserved antegrade flow without reflux. IMPRESSION: 1. Technically successful percutaneous bland particle embolization of the accessory left hepatic artery arising from the left gastric artery. 2. Technically successful radioembolization of the right hepatic artery with Yttrium-90 microspheres. PLAN: Given inability to successfully catheterize the left hepatic artery as well as the patient's chronic renal insufficiency we will NOT pursue additional hepatic directed therapy at this time. Initial imaging (likely with PET-CT), we will be performed in conjunction with referring oncologist, Dr. Burr Medico, in approximately 3 months (mid May 2023). Electronically Signed   By: Sandi Mariscal M.D.   On: 05/02/2021 17:27   IR Angiogram Selective Each Additional Vessel  Result Date: 05/02/2021 INDICATION: Cholangiocarcinoma. Patient presents today for bland embolization of an accessory left hepatic artery arising from the left gastric artery as well as Y 90 radioembolization of either the right or left hepatic arteries. EXAM: 1. SELECTIVE CELIAC ARTERIOGRAM 2. SPLENIC ARTERIOGRAM 3. SUB SELECTIVE LEFT GASTRIC ARTERIOGRAM 4. SUB SELECTIVE ACCESSORY LEFT HEPATIC ARTERIOGRAM AND PERCUTANEOUS PARTICLE EMBOLIZATION 5. SELECTIVE PROPER HEPATIC ARTERIOGRAM 6.  ATTEMPTED THOUGH ULTIMATELY UNSUCCESSFUL CATHETERIZATION OF THE LEFT HEPATIC ARTERY 7. SUB SELECTIVE RIGHT HEPATIC ARTERIOGRAM AND TRANSCATHETER YTTRIUM-90 RADIOEMBOLIZATION. 8. FLUOROSCOPIC GUIDED ADMINISTRATION OF SIR-SPHERES 9. ULTRASOUND GUIDED VASCULAR ACCESS OF THE RIGHT COMMON FEMORAL ARTERY COMPARISON:  Mapping Y 90 radioembolization and postprocedural nuclear medicine imaging-04/18/2021 CT abdomen pelvis-05/08/2020 MEDICATIONS: Protonix 40 mg IV; Decadron 20 mg IV; Toradol 30 mg IV; Zofran 4 mg IV CONTRAST:  90 cc Omnipaque 300 ANESTHESIA/SEDATION: Moderate (conscious) sedation was employed during this procedure as administered by the Interventional Radiology RN. A total of Versed 4 mg and Fentanyl 100 mcg was administered intravenously. Moderate Sedation Time: 120 minutes. The patient's level of consciousness and vital signs were monitored continuously by radiology nursing throughout the procedure under my direct supervision. FLUOROSCOPY TIME:  40 minutes, 36 seconds (1,334 mGy) ACCESS: Right common femoral artery; hemostasis achieved with manual compression. COMPLICATIONS: None immediate. TECHNIQUE: Informed written consent was obtained from the patient after a discussion of the risks, benefits and alternatives to treatment. Questions regarding the procedure were encouraged and answered. A timeout was performed prior to the initiation of the procedure. The right groin was prepped and draped in the usual sterile fashion, and a sterile drape was applied covering the operative field. Maximum barrier sterile technique with sterile gowns and gloves were used for the procedure. A timeout was performed prior to the initiation of the procedure. Local anesthesia was provided with 1% lidocaine. The right femoral head was marked fluoroscopically. Under direct ultrasound guidance, the right common femoral artery was accessed with a micropuncture kit allowing placement of  a of 5-French vascular sheath. An ultrasound  image was saved for documentation purposes. A limited arteriogram performed through the side arm of the sheath confirming appropriate access within the right common femoral artery. Over a Britta Mccreedy wire, a Mickelson catheter was advanced to the level of the inferior thoracic aorta where it was reformed, back bled and flushed. The Mickelson catheter was utilized to select the celiac artery and a celiac arteriogram was performed. A high-flow Progreat microcatheter was utilized to select the splenic artery and a selective splenic arteriogram was performed. With the use of a fathom 14 and ultimately a fathom 16 microwire, a high-flow Progreat microcatheter was utilized to select the left gastric artery and a selective left gastric arteriogram was performed Next, the Progreat microcatheter was advanced to the level of the accessory left hepatic artery and a sub selective left hepatic arteriogram was performed. From the approximate location of the vessel's main bifurcation, the accessory left hepatic artery was percutaneously particle embolized with 1 vial of 300-500 micron Embospheres. The microcatheter was retracted to the more proximal aspect of the accessory left hepatic artery and a post embolization arteriogram was performed Next, the microcatheter was utilized to select the proper hepatic artery and a selective proper hepatic arteriogram was performed. Prolonged efforts were made with a fathom 33, a fathom 33 and a GT glidewire to cannulate the left hepatic artery and while adequate wire purchase was achieved within the vessel, neither a Progreat or high-flow Renegade microcatheter could be catheterized within the vessel secondary to marked angulation and suspected narrowing of the vessel's origin. At this time, the decision was made to proceed with Y 90 radioembolization of the right lobe of the liver. The high-flow Renegade microcatheter was advanced to the main bifurcation of the right hepatic artery and a sub  selective right hepatic arteriogram was performed. Radioembolization was then performed with Yttrium-90 SIR Spheres. Particles were administered via a microcatheter utilizing a completely enclosed system. Monitoring of antegrade flow was performed during and after the administration under fluoroscopy with use of contrast intermittently. After the administration of the particles, the microcatheter, outer catheter and administration system were then discarded into radiation safety receptacle. At this point, the procedure was terminated. The right common femoral approach vascular sheath was removed and hemostasis was achieved with manual compression. A dressing was applied. The patient tolerated procedure well without immediate postprocedural complication. All participants involved with the procedure were surveyed by the radiation safety officer prior to exiting the fluoroscopy suite. The patient was escorted to the nuclear medicine department for post-treatment imaging. FINDINGS: Despite a markedly tortuous origin, ultimately, the left gastric artery was successfully catheterized with sub selective injection demonstrating a large accessory left hepatic artery arising from its distal aspect. 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 As above, despite prolonged efforts, the left hepatic artery could NOT be catheterized despite adequate wire purchase secondary to marked tortuosity, angulation and potential narrowing of the vessel's origin. Successful Y 90 radioembolization of the right hepatic artery at the level of the vessel's proximal bifurcation. Intermittent contrast injections during and after the radioembolization confirming preserved antegrade  flow without reflux. IMPRESSION: 1. Technically successful percutaneous bland particle embolization of the accessory left hepatic artery arising from the left gastric artery. 2. Technically successful radioembolization of the right hepatic artery with  Yttrium-90 microspheres. PLAN: Given inability to successfully catheterize the left hepatic artery as well as the patient's chronic renal insufficiency we will NOT pursue additional hepatic directed therapy at this time. Initial imaging (likely with PET-CT), we will be performed in conjunction with referring oncologist, Dr. Burr Medico, in approximately 3 months (mid May 2023). Electronically Signed   By: Sandi Mariscal M.D.   On: 05/02/2021 17:27   IR Angiogram Selective Each Additional Vessel  Result Date: 05/02/2021 INDICATION: Cholangiocarcinoma. Patient presents today for bland embolization of an accessory left hepatic artery arising from the left gastric artery as well as Y 90 radioembolization of either the right or left hepatic arteries. EXAM: 1. SELECTIVE CELIAC ARTERIOGRAM 2. SPLENIC ARTERIOGRAM 3. SUB SELECTIVE LEFT GASTRIC ARTERIOGRAM 4. SUB SELECTIVE ACCESSORY LEFT HEPATIC ARTERIOGRAM AND PERCUTANEOUS PARTICLE EMBOLIZATION 5. SELECTIVE PROPER HEPATIC ARTERIOGRAM 6. ATTEMPTED THOUGH ULTIMATELY UNSUCCESSFUL CATHETERIZATION OF THE LEFT HEPATIC ARTERY 7. SUB SELECTIVE RIGHT HEPATIC ARTERIOGRAM AND TRANSCATHETER YTTRIUM-90 RADIOEMBOLIZATION. 8. FLUOROSCOPIC GUIDED ADMINISTRATION OF SIR-SPHERES 9. ULTRASOUND GUIDED VASCULAR ACCESS OF THE RIGHT COMMON FEMORAL ARTERY COMPARISON:  Mapping Y 90 radioembolization and postprocedural nuclear medicine imaging-04/18/2021 CT abdomen pelvis-05/08/2020 MEDICATIONS: Protonix 40 mg IV; Decadron 20 mg IV; Toradol 30 mg IV; Zofran 4 mg IV CONTRAST:  90 cc Omnipaque 300 ANESTHESIA/SEDATION: Moderate (conscious) sedation was employed during this procedure as administered by the Interventional Radiology RN. A total of Versed 4 mg and  Fentanyl 100 mcg was administered intravenously. Moderate Sedation Time: 120 minutes. The patient's level of consciousness and vital signs were monitored continuously by radiology nursing throughout the procedure under my direct supervision. FLUOROSCOPY TIME:  40 minutes, 36 seconds (1,334 mGy) ACCESS: Right common femoral artery; hemostasis achieved with manual compression. COMPLICATIONS: None immediate. TECHNIQUE: Informed written consent was obtained from the patient after a discussion of the risks, benefits and alternatives to treatment. Questions regarding the procedure were encouraged and answered. A timeout was performed prior to the initiation of the procedure. The right groin was prepped and draped in the usual sterile fashion, and a sterile drape was applied covering the operative field. Maximum barrier sterile technique with sterile gowns and gloves were used for the procedure. A timeout was performed prior to the initiation of the procedure. Local anesthesia was provided with 1% lidocaine. The right femoral head was marked fluoroscopically. Under direct ultrasound guidance, the right common femoral artery was accessed with a micropuncture kit allowing placement of a of 5-French vascular sheath. An ultrasound image was saved for documentation purposes. A limited arteriogram performed through the side arm of the sheath confirming appropriate access within the right common femoral artery. Over a Britta Mccreedy wire, a Mickelson catheter was advanced to the level of the inferior thoracic aorta where it was reformed, back bled and flushed. The Mickelson catheter was utilized to select the celiac artery and a celiac arteriogram was performed. A high-flow Progreat microcatheter was utilized to select the splenic artery and a selective splenic arteriogram was performed. With the use of a fathom 14 and ultimately a fathom 16 microwire, a high-flow Progreat microcatheter was utilized to select the left gastric artery and  a selective left gastric arteriogram was performed Next, the Progreat microcatheter was advanced to the level of the accessory left hepatic artery and a sub selective left hepatic arteriogram was performed. From the approximate location of the vessel's main bifurcation, the accessory left hepatic artery was percutaneously particle embolized with 1 vial of 300-500 micron Embospheres. The microcatheter was retracted to the more proximal aspect of  the accessory left hepatic artery and a post embolization arteriogram was performed Next, the microcatheter was utilized to select the proper hepatic artery and a selective proper hepatic arteriogram was performed. Prolonged efforts were made with a fathom 73, a fathom 69 and a GT glidewire to cannulate the left hepatic artery and while adequate wire purchase was achieved within the vessel, neither a Progreat or high-flow Renegade microcatheter could be catheterized within the vessel secondary to marked angulation and suspected narrowing of the vessel's origin. At this time, the decision was made to proceed with Y 90 radioembolization of the right lobe of the liver. The high-flow Renegade microcatheter was advanced to the main bifurcation of the right hepatic artery and a sub selective right hepatic arteriogram was performed. Radioembolization was then performed with Yttrium-90 SIR Spheres. Particles were administered via a microcatheter utilizing a completely enclosed system. Monitoring of antegrade flow was performed during and after the administration under fluoroscopy with use of contrast intermittently. After the administration of the particles, the microcatheter, outer catheter and administration system were then discarded into radiation safety receptacle. At this point, the procedure was terminated. The right common femoral approach vascular sheath was removed and hemostasis was achieved with manual compression. A dressing was applied. The patient tolerated procedure  well without immediate postprocedural complication. All participants involved with the procedure were surveyed by the radiation safety officer prior to exiting the fluoroscopy suite. The patient was escorted to the nuclear medicine department for post-treatment imaging. FINDINGS: Despite a markedly tortuous origin, ultimately, the left gastric artery was successfully catheterized with sub selective injection demonstrating a large accessory left hepatic artery arising from its distal aspect. 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 As above, despite prolonged efforts, the left hepatic artery could NOT be catheterized despite adequate wire purchase secondary to marked tortuosity, angulation and potential narrowing of the vessel's origin. Successful Y 90 radioembolization of the right hepatic artery at the level of the vessel's proximal bifurcation. Intermittent contrast injections during and after the radioembolization confirming preserved antegrade flow without reflux. IMPRESSION: 1. Technically successful percutaneous bland particle embolization of the accessory left hepatic artery arising from the left gastric artery. 2. Technically successful radioembolization of the right hepatic artery with Yttrium-90 microspheres. PLAN: Given inability to successfully catheterize the left hepatic artery as well as the patient's chronic renal insufficiency we will NOT pursue additional hepatic directed therapy at this time. Initial imaging (likely with PET-CT), we will be performed in conjunction with referring oncologist, Dr. Burr Medico, in approximately 3 months (mid May 2023). Electronically Signed   By: Sandi Mariscal M.D.   On: 05/02/2021 17:27   IR Angiogram  Selective Each Additional Vessel  Result Date: 05/02/2021 INDICATION: Cholangiocarcinoma. Patient presents today for bland embolization of an accessory left hepatic artery arising from the left gastric artery as well as Y 90 radioembolization of either the right or left hepatic arteries. EXAM: 1. SELECTIVE CELIAC ARTERIOGRAM 2. SPLENIC ARTERIOGRAM 3. SUB SELECTIVE LEFT GASTRIC ARTERIOGRAM 4. SUB SELECTIVE ACCESSORY LEFT HEPATIC ARTERIOGRAM AND PERCUTANEOUS PARTICLE EMBOLIZATION 5. SELECTIVE PROPER HEPATIC ARTERIOGRAM 6. ATTEMPTED THOUGH ULTIMATELY UNSUCCESSFUL CATHETERIZATION OF THE LEFT HEPATIC ARTERY 7. SUB SELECTIVE RIGHT HEPATIC ARTERIOGRAM AND TRANSCATHETER YTTRIUM-90 RADIOEMBOLIZATION. 8. FLUOROSCOPIC GUIDED ADMINISTRATION OF SIR-SPHERES 9. ULTRASOUND GUIDED VASCULAR ACCESS OF THE RIGHT COMMON FEMORAL ARTERY COMPARISON:  Mapping  Y 90 radioembolization and postprocedural nuclear medicine imaging-04/18/2021 CT abdomen pelvis-05/08/2020 MEDICATIONS: Protonix 40 mg IV; Decadron 20 mg IV; Toradol 30 mg IV; Zofran 4 mg IV CONTRAST:  90 cc Omnipaque 300 ANESTHESIA/SEDATION: Moderate (conscious) sedation was employed during this procedure as administered by the Interventional Radiology RN. A total of Versed 4 mg and Fentanyl 100 mcg was administered intravenously. Moderate Sedation Time: 120 minutes. The patient's level of consciousness and vital signs were monitored continuously by radiology nursing throughout the procedure under my direct supervision. FLUOROSCOPY TIME:  40 minutes, 36 seconds (1,334 mGy) ACCESS: Right common femoral artery; hemostasis achieved with manual compression. COMPLICATIONS: None immediate. TECHNIQUE: Informed written consent was obtained from the patient after a discussion of the risks, benefits and alternatives to treatment. Questions regarding the procedure were encouraged and answered. A timeout was performed prior to the initiation of the procedure. The right groin was prepped and  draped in the usual sterile fashion, and a sterile drape was applied covering the operative field. Maximum barrier sterile technique with sterile gowns and gloves were used for the procedure. A timeout was performed prior to the initiation of the procedure. Local anesthesia was provided with 1% lidocaine. The right femoral head was marked fluoroscopically. Under direct ultrasound guidance, the right common femoral artery was accessed with a micropuncture kit allowing placement of a of 5-French vascular sheath. An ultrasound image was saved for documentation purposes. A limited arteriogram performed through the side arm of the sheath confirming appropriate access within the right common femoral artery. Over a Britta Mccreedy wire, a Mickelson catheter was advanced to the level of the inferior thoracic aorta where it was reformed, back bled and flushed. The Mickelson catheter was utilized to select the celiac artery and a celiac arteriogram was performed. A high-flow Progreat microcatheter was utilized to select the splenic artery and a selective splenic arteriogram was performed. With the use of a fathom 14 and ultimately a fathom 16 microwire, a high-flow Progreat microcatheter was utilized to select the left gastric artery and a selective left gastric arteriogram was performed Next, the Progreat microcatheter was advanced to the level of the accessory left hepatic artery and a sub selective left hepatic arteriogram was performed. From the approximate location of the vessel's main bifurcation, the accessory left hepatic artery was percutaneously particle embolized with 1 vial of 300-500 micron Embospheres. The microcatheter was retracted to the more proximal aspect of the accessory left hepatic artery and a post embolization arteriogram was performed Next, the microcatheter was utilized to select the proper hepatic artery and a selective proper hepatic arteriogram was performed. Prolonged efforts were made with a fathom 45,  a fathom 52 and a GT glidewire to cannulate the left hepatic artery and while adequate wire purchase was achieved within the vessel, neither a Progreat or high-flow Renegade microcatheter could be catheterized within the vessel secondary to marked angulation and suspected narrowing of the vessel's origin. At this time, the decision was made to proceed with Y 90 radioembolization of the right lobe of the liver. The high-flow Renegade microcatheter was advanced to the main bifurcation of the right hepatic artery and a sub selective right hepatic arteriogram was performed. Radioembolization was then performed with Yttrium-90 SIR Spheres. Particles were administered via a microcatheter utilizing a completely enclosed system. Monitoring of antegrade flow was performed during and after the administration under fluoroscopy with use of contrast intermittently. After the administration of the particles, the microcatheter, outer catheter and administration system were then discarded into radiation  safety receptacle. At this point, the procedure was terminated. The right common femoral approach vascular sheath was removed and hemostasis was achieved with manual compression. A dressing was applied. The patient tolerated procedure well without immediate postprocedural complication. All participants involved with the procedure were surveyed by the radiation safety officer prior to exiting the fluoroscopy suite. The patient was escorted to the nuclear medicine department for post-treatment imaging. FINDINGS: Despite a markedly tortuous origin, ultimately, the left gastric artery was successfully catheterized with sub selective injection demonstrating a large accessory left hepatic artery arising from its distal aspect. 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 As above, despite prolonged efforts, the left hepatic artery could NOT be catheterized despite adequate wire purchase secondary to marked tortuosity, angulation and potential narrowing of the vessel's origin. Successful Y 90 radioembolization of the right hepatic artery at the level of the vessel's proximal bifurcation. Intermittent contrast injections during and after the radioembolization confirming preserved antegrade flow without reflux. IMPRESSION: 1. Technically successful percutaneous bland particle embolization of the accessory left hepatic artery arising from the left gastric artery. 2. Technically successful radioembolization of the right hepatic artery with Yttrium-90 microspheres. PLAN: Given inability to successfully catheterize the left hepatic artery as well as the patient's chronic renal insufficiency we will NOT pursue additional hepatic directed therapy at this time. Initial imaging (likely with PET-CT), we will be performed in conjunction with referring oncologist, Dr. Burr Medico, in approximately 3 months (mid May 2023). Electronically Signed   By: Sandi Mariscal M.D.   On: 05/02/2021 17:27   NM Special Med Rad Physics Cons  Result Date: 05/02/2021 CLINICAL DATA:  Unresectable cholangiocarcinoma within the central liver. Yttrium 90 microsphere radio embolization treatment EXAM: NUCLEAR MEDICINE SPECIAL MED RAD PHYSICS CONS; NUCLEAR MEDICINE RADIO PHARM THERAPY INTRA ARTERIAL; NUCLEAR MEDICINE TREATMENT PROCEDURE; NUCLEAR MEDICINE LIVER SCAN TECHNIQUE: In conjunction with the interventional radiologist a Y- Microsphere dose was calculated utilizing body surface area formulation and partition formula. 3D post processing volume calculations applied. Calculated dose equal 17.3 mCi. Pre therapy MAA liver SPECT scan and CTA were evaluated. Utilizing a microcatheter system, the hepatic artery was  selected and Y-90 microspheres were delivered in fractionated aliquots. Radiopharmaceutical was delivered by the interventional radiologist and nuclear radiologist. The patient tolerated procedure well. No adverse effects were noted. Bremsstrahlung planar and SPECT imaging of the abdomen following intrahepatic arterial delivery of Y-90 microsphere was performed. RADIOPHARMACEUTICALS:  64.15 millicuries yttrium 90 microspheres COMPARISON:  MAA scan 04/18/2021, angiography 04/18/2021, CT 10/15/2021 22 FINDINGS: Y - 90 microspheres therapy as above. First therapy the right hepatic lobe. Bremsstrahlung planar and SPECT imaging of the abdomen following intrahepatic arterial delivery of Y-3mcrosphere demonstrates radioactivity localized to the RIGHT and central LEFT hepatic lobe. No evidence of extrahepatic activity. IMPRESSION: Successful Y - 90 microsphere delivery for treatment of unresectable liver metastasis. First therapy to the RIGHT lobe. Bremssstrahlung scan demonstrates activity localized to RIGHT hepatic lobe and central LEFT hepatic lobe. Focal accumulation within the neoplasm identified. No extrahepatic activity identified. Electronically Signed   By: SSuzy BouchardM.D.   On: 05/02/2021 14:28   NM Special Treatment Procedure  Result Date: 05/02/2021 CLINICAL DATA:  Unresectable cholangiocarcinoma within the central liver. Yttrium 90 microsphere radio embolization treatment EXAM: NUCLEAR MEDICINE SPECIAL MED RAD PHYSICS CONS; NUCLEAR MEDICINE RADIO PHARM THERAPY  INTRA ARTERIAL; NUCLEAR MEDICINE TREATMENT PROCEDURE; NUCLEAR MEDICINE LIVER SCAN TECHNIQUE: In conjunction with the interventional radiologist a Y- Microsphere dose was calculated utilizing body surface area formulation and partition formula. 3D post processing volume calculations applied. Calculated dose equal 17.3 mCi. Pre therapy MAA liver SPECT scan and CTA were evaluated. Utilizing a microcatheter system, the hepatic artery was selected  and Y-90 microspheres were delivered in fractionated aliquots. Radiopharmaceutical was delivered by the interventional radiologist and nuclear radiologist. The patient tolerated procedure well. No adverse effects were noted. Bremsstrahlung planar and SPECT imaging of the abdomen following intrahepatic arterial delivery of Y-90 microsphere was performed. RADIOPHARMACEUTICALS:  47.65 millicuries yttrium 90 microspheres COMPARISON:  MAA scan 04/18/2021, angiography 04/18/2021, CT 10/15/2021 22 FINDINGS: Y - 90 microspheres therapy as above. First therapy the right hepatic lobe. Bremsstrahlung planar and SPECT imaging of the abdomen following intrahepatic arterial delivery of Y-36mcrosphere demonstrates radioactivity localized to the RIGHT and central LEFT hepatic lobe. No evidence of extrahepatic activity. IMPRESSION: Successful Y - 90 microsphere delivery for treatment of unresectable liver metastasis. First therapy to the RIGHT lobe. Bremssstrahlung scan demonstrates activity localized to RIGHT hepatic lobe and central LEFT hepatic lobe. Focal accumulation within the neoplasm identified. No extrahepatic activity identified. Electronically Signed   By: SSuzy BouchardM.D.   On: 05/02/2021 14:28   IR 3D Independent WDarreld Mclean Result Date: 05/02/2021 INDICATION: Cholangiocarcinoma. Patient presents today for bland embolization of an accessory left hepatic artery arising from the left gastric artery as well as Y 90 radioembolization of either the right or left hepatic arteries. EXAM: 1. SELECTIVE CELIAC ARTERIOGRAM 2. SPLENIC ARTERIOGRAM 3. SUB SELECTIVE LEFT GASTRIC ARTERIOGRAM 4. SUB SELECTIVE ACCESSORY LEFT HEPATIC ARTERIOGRAM AND PERCUTANEOUS PARTICLE EMBOLIZATION 5. SELECTIVE PROPER HEPATIC ARTERIOGRAM 6. ATTEMPTED THOUGH ULTIMATELY UNSUCCESSFUL CATHETERIZATION OF THE LEFT HEPATIC ARTERY 7. SUB SELECTIVE RIGHT HEPATIC ARTERIOGRAM AND TRANSCATHETER YTTRIUM-90 RADIOEMBOLIZATION. 8. FLUOROSCOPIC GUIDED ADMINISTRATION  OF SIR-SPHERES 9. ULTRASOUND GUIDED VASCULAR ACCESS OF THE RIGHT COMMON FEMORAL ARTERY COMPARISON:  Mapping Y 90 radioembolization and postprocedural nuclear medicine imaging-04/18/2021 CT abdomen pelvis-05/08/2020 MEDICATIONS: Protonix 40 mg IV; Decadron 20 mg IV; Toradol 30 mg IV; Zofran 4 mg IV CONTRAST:  90 cc Omnipaque 300 ANESTHESIA/SEDATION: Moderate (conscious) sedation was employed during this procedure as administered by the Interventional Radiology RN. A total of Versed 4 mg and Fentanyl 100 mcg was administered intravenously. Moderate Sedation Time: 120 minutes. The patient's level of consciousness and vital signs were monitored continuously by radiology nursing throughout the procedure under my direct supervision. FLUOROSCOPY TIME:  40 minutes, 36 seconds (1,334 mGy) ACCESS: Right common femoral artery; hemostasis achieved with manual compression. COMPLICATIONS: None immediate. TECHNIQUE: Informed written consent was obtained from the patient after a discussion of the risks, benefits and alternatives to treatment. Questions regarding the procedure were encouraged and answered. A timeout was performed prior to the initiation of the procedure. The right groin was prepped and draped in the usual sterile fashion, and a sterile drape was applied covering the operative field. Maximum barrier sterile technique with sterile gowns and gloves were used for the procedure. A timeout was performed prior to the initiation of the procedure. Local anesthesia was provided with 1% lidocaine. The right femoral head was marked fluoroscopically. Under direct ultrasound guidance, the right common femoral artery was accessed with a micropuncture kit allowing placement of a of 5-French vascular sheath. An ultrasound image was saved for documentation purposes. A limited arteriogram performed through the side arm of the sheath confirming appropriate access within the right  common femoral artery. Over a Britta Mccreedy wire, a Mickelson  catheter was advanced to the level of the inferior thoracic aorta where it was reformed, back bled and flushed. The Mickelson catheter was utilized to select the celiac artery and a celiac arteriogram was performed. A high-flow Progreat microcatheter was utilized to select the splenic artery and a selective splenic arteriogram was performed. With the use of a fathom 14 and ultimately a fathom 16 microwire, a high-flow Progreat microcatheter was utilized to select the left gastric artery and a selective left gastric arteriogram was performed Next, the Progreat microcatheter was advanced to the level of the accessory left hepatic artery and a sub selective left hepatic arteriogram was performed. From the approximate location of the vessel's main bifurcation, the accessory left hepatic artery was percutaneously particle embolized with 1 vial of 300-500 micron Embospheres. The microcatheter was retracted to the more proximal aspect of the accessory left hepatic artery and a post embolization arteriogram was performed Next, the microcatheter was utilized to select the proper hepatic artery and a selective proper hepatic arteriogram was performed. Prolonged efforts were made with a fathom 34, a fathom 19 and a GT glidewire to cannulate the left hepatic artery and while adequate wire purchase was achieved within the vessel, neither a Progreat or high-flow Renegade microcatheter could be catheterized within the vessel secondary to marked angulation and suspected narrowing of the vessel's origin. At this time, the decision was made to proceed with Y 90 radioembolization of the right lobe of the liver. The high-flow Renegade microcatheter was advanced to the main bifurcation of the right hepatic artery and a sub selective right hepatic arteriogram was performed. Radioembolization was then performed with Yttrium-90 SIR Spheres. Particles were administered via a microcatheter utilizing a completely enclosed system. Monitoring  of antegrade flow was performed during and after the administration under fluoroscopy with use of contrast intermittently. After the administration of the particles, the microcatheter, outer catheter and administration system were then discarded into radiation safety receptacle. At this point, the procedure was terminated. The right common femoral approach vascular sheath was removed and hemostasis was achieved with manual compression. A dressing was applied. The patient tolerated procedure well without immediate postprocedural complication. All participants involved with the procedure were surveyed by the radiation safety officer prior to exiting the fluoroscopy suite. The patient was escorted to the nuclear medicine department for post-treatment imaging. FINDINGS: Despite a markedly tortuous origin, ultimately, the left gastric artery was successfully catheterized with sub selective injection demonstrating a large accessory left hepatic artery arising from its distal aspect. 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 As above, despite prolonged efforts, the left hepatic artery could NOT be catheterized despite adequate wire purchase secondary to marked tortuosity, angulation and potential narrowing of the vessel's origin. Successful Y 90 radioembolization of the right hepatic artery at the level of the vessel's proximal bifurcation. Intermittent contrast injections during and after the radioembolization confirming preserved antegrade flow without reflux. IMPRESSION: 1. Technically successful percutaneous bland particle embolization of the accessory left hepatic artery arising from the left gastric artery. 2. Technically successful  radioembolization of the right hepatic artery with Yttrium-90 microspheres. PLAN: Given inability to successfully catheterize the left hepatic artery as well as the patient's chronic renal insufficiency we will NOT pursue additional hepatic directed therapy at this  time. Initial imaging (likely with PET-CT), we will be performed in conjunction with referring oncologist, Dr. Burr Medico, in approximately 3 months (mid May 2023). Electronically Signed   By: Sandi Mariscal M.D.   On: 05/02/2021 17:27   IR US Guide Vasc Access Right  Result Date: 05/02/2021 INDICATION: Cholangiocarcinoma. Patient presents today for bland embolization of an accessory left hepatic artery arising from the left gastric artery as well as Y 90 radioembolization of either the right or left hepatic arteries. EXAM: 1. SELECTIVE CELIAC ARTERIOGRAM 2. SPLENIC ARTERIOGRAM 3. SUB SELECTIVE LEFT GASTRIC ARTERIOGRAM 4. SUB SELECTIVE ACCESSORY LEFT HEPATIC ARTERIOGRAM AND PERCUTANEOUS PARTICLE EMBOLIZATION 5. SELECTIVE PROPER HEPATIC ARTERIOGRAM 6. ATTEMPTED THOUGH ULTIMATELY UNSUCCESSFUL CATHETERIZATION OF THE LEFT HEPATIC ARTERY 7. SUB SELECTIVE RIGHT HEPATIC ARTERIOGRAM AND TRANSCATHETER YTTRIUM-90 RADIOEMBOLIZATION. 8. FLUOROSCOPIC GUIDED ADMINISTRATION OF SIR-SPHERES 9. ULTRASOUND GUIDED VASCULAR ACCESS OF THE RIGHT COMMON FEMORAL ARTERY COMPARISON:  Mapping Y 90 radioembolization and postprocedural nuclear medicine imaging-04/18/2021 CT abdomen pelvis-05/08/2020 MEDICATIONS: Protonix 40 mg IV; Decadron 20 mg IV; Toradol 30 mg IV; Zofran 4 mg IV CONTRAST:  90 cc Omnipaque 300 ANESTHESIA/SEDATION: Moderate (conscious) sedation was employed during this procedure as administered by the Interventional Radiology RN. A total of Versed 4 mg and Fentanyl 100 mcg was administered intravenously. Moderate Sedation Time: 120 minutes. The patient's level of consciousness and vital signs were monitored continuously by radiology nursing throughout the procedure under  my direct supervision. FLUOROSCOPY TIME:  40 minutes, 36 seconds (1,334 mGy) ACCESS: Right common femoral artery; hemostasis achieved with manual compression. COMPLICATIONS: None immediate. TECHNIQUE: Informed written consent was obtained from the patient after a discussion of the risks, benefits and alternatives to treatment. Questions regarding the procedure were encouraged and answered. A timeout was performed prior to the initiation of the procedure. The right groin was prepped and draped in the usual sterile fashion, and a sterile drape was applied covering the operative field. Maximum barrier sterile technique with sterile gowns and gloves were used for the procedure. A timeout was performed prior to the initiation of the procedure. Local anesthesia was provided with 1% lidocaine. The right femoral head was marked fluoroscopically. Under direct ultrasound guidance, the right common femoral artery was accessed with a micropuncture kit allowing placement of a of 5-French vascular sheath. An ultrasound image was saved for documentation purposes. A limited arteriogram performed through the side arm of the sheath confirming appropriate access within the right common femoral artery. Over a Britta Mccreedy wire, a Mickelson catheter was advanced to the level of the inferior thoracic aorta where it was reformed, back bled and flushed. The Mickelson catheter was utilized to select the celiac artery and a celiac arteriogram was performed. A high-flow Progreat microcatheter was utilized to select the splenic artery and a selective splenic arteriogram was performed. With the use of a fathom 14 and ultimately a fathom 16 microwire, a high-flow Progreat microcatheter was utilized to select the left gastric artery and a selective left gastric arteriogram was performed Next, the Progreat microcatheter was advanced to the level of the accessory left hepatic artery and a sub selective left hepatic arteriogram was performed. From the  approximate location of the vessel's main bifurcation, the accessory left hepatic artery was percutaneously particle embolized with 1 vial of 300-500 micron Embospheres. The microcatheter was retracted to the more proximal aspect of the accessory left hepatic artery and a post embolization arteriogram was performed Next, the microcatheter was utilized to select the proper hepatic artery and a selective proper hepatic arteriogram was  performed. Prolonged efforts were made with a fathom 11, a fathom 34 and a GT glidewire to cannulate the left hepatic artery and while adequate wire purchase was achieved within the vessel, neither a Progreat or high-flow Renegade microcatheter could be catheterized within the vessel secondary to marked angulation and suspected narrowing of the vessel's origin. At this time, the decision was made to proceed with Y 90 radioembolization of the right lobe of the liver. The high-flow Renegade microcatheter was advanced to the main bifurcation of the right hepatic artery and a sub selective right hepatic arteriogram was performed. Radioembolization was then performed with Yttrium-90 SIR Spheres. Particles were administered via a microcatheter utilizing a completely enclosed system. Monitoring of antegrade flow was performed during and after the administration under fluoroscopy with use of contrast intermittently. After the administration of the particles, the microcatheter, outer catheter and administration system were then discarded into radiation safety receptacle. At this point, the procedure was terminated. The right common femoral approach vascular sheath was removed and hemostasis was achieved with manual compression. A dressing was applied. The patient tolerated procedure well without immediate postprocedural complication. All participants involved with the procedure were surveyed by the radiation safety officer prior to exiting the fluoroscopy suite. The patient was escorted to the  nuclear medicine department for post-treatment imaging. FINDINGS: Despite a markedly tortuous origin, ultimately, the left gastric artery was successfully catheterized with sub selective injection demonstrating a large accessory left hepatic artery arising from its distal aspect. 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 As above, despite prolonged efforts, the left hepatic artery could NOT be catheterized despite adequate wire purchase secondary to marked tortuosity, angulation and potential narrowing of the vessel's origin. Successful Y 90 radioembolization of the right hepatic artery at the level of the vessel's proximal bifurcation. Intermittent contrast injections during and after the radioembolization confirming preserved antegrade flow without reflux. IMPRESSION: 1. Technically successful percutaneous bland particle embolization of the accessory left hepatic artery arising from the left gastric artery. 2. Technically successful radioembolization of the right hepatic artery with Yttrium-90 microspheres. PLAN: Given inability to successfully catheterize the left hepatic artery as well as the patient's chronic renal insufficiency we will NOT pursue additional hepatic directed therapy at this time. Initial imaging (likely with PET-CT), we will be performed in conjunction with referring oncologist, Dr. Burr Medico, in approximately 3 months (mid May 2023). Electronically Signed   By: Sandi Mariscal M.D.   On: 05/02/2021 17:27   IR EMBO TUMOR ORGAN ISCHEMIA INFARCT INC GUIDE ROADMAPPING  Result Date: 05/02/2021 INDICATION: Cholangiocarcinoma. Patient presents today for bland embolization of an accessory left hepatic artery arising from the left gastric  artery as well as Y 90 radioembolization of either the right or left hepatic arteries. EXAM: 1. SELECTIVE CELIAC ARTERIOGRAM 2. SPLENIC ARTERIOGRAM 3. SUB SELECTIVE LEFT GASTRIC ARTERIOGRAM 4. SUB SELECTIVE ACCESSORY LEFT HEPATIC ARTERIOGRAM AND PERCUTANEOUS PARTICLE EMBOLIZATION 5. SELECTIVE PROPER HEPATIC ARTERIOGRAM 6. ATTEMPTED THOUGH ULTIMATELY UNSUCCESSFUL CATHETERIZATION OF THE LEFT HEPATIC ARTERY 7. SUB SELECTIVE RIGHT HEPATIC ARTERIOGRAM AND TRANSCATHETER YTTRIUM-90 RADIOEMBOLIZATION. 8. FLUOROSCOPIC GUIDED ADMINISTRATION OF SIR-SPHERES 9. ULTRASOUND GUIDED VASCULAR ACCESS OF THE RIGHT COMMON FEMORAL ARTERY COMPARISON:  Mapping Y 90 radioembolization and postprocedural nuclear medicine imaging-04/18/2021 CT abdomen pelvis-05/08/2020 MEDICATIONS: Protonix 40 mg IV; Decadron 20 mg IV; Toradol 30 mg IV; Zofran 4 mg  IV CONTRAST:  90 cc Omnipaque 300 ANESTHESIA/SEDATION: Moderate (conscious) sedation was employed during this procedure as administered by the Interventional Radiology RN. A total of Versed 4 mg and Fentanyl 100 mcg was administered intravenously. Moderate Sedation Time: 120 minutes. The patient's level of consciousness and vital signs were monitored continuously by radiology nursing throughout the procedure under my direct supervision. FLUOROSCOPY TIME:  40 minutes, 36 seconds (1,334 mGy) ACCESS: Right common femoral artery; hemostasis achieved with manual compression. COMPLICATIONS: None immediate. TECHNIQUE: Informed written consent was obtained from the patient after a discussion of the risks, benefits and alternatives to treatment. Questions regarding the procedure were encouraged and answered. A timeout was performed prior to the initiation of the procedure. The right groin was prepped and draped in the usual sterile fashion, and a sterile drape was applied covering the operative field. Maximum barrier sterile technique with sterile gowns and gloves were used for the procedure. A timeout was  performed prior to the initiation of the procedure. Local anesthesia was provided with 1% lidocaine. The right femoral head was marked fluoroscopically. Under direct ultrasound guidance, the right common femoral artery was accessed with a micropuncture kit allowing placement of a of 5-French vascular sheath. An ultrasound image was saved for documentation purposes. A limited arteriogram performed through the side arm of the sheath confirming appropriate access within the right common femoral artery. Over a Britta Mccreedy wire, a Mickelson catheter was advanced to the level of the inferior thoracic aorta where it was reformed, back bled and flushed. The Mickelson catheter was utilized to select the celiac artery and a celiac arteriogram was performed. A high-flow Progreat microcatheter was utilized to select the splenic artery and a selective splenic arteriogram was performed. With the use of a fathom 14 and ultimately a fathom 16 microwire, a high-flow Progreat microcatheter was utilized to select the left gastric artery and a selective left gastric arteriogram was performed Next, the Progreat microcatheter was advanced to the level of the accessory left hepatic artery and a sub selective left hepatic arteriogram was performed. From the approximate location of the vessel's main bifurcation, the accessory left hepatic artery was percutaneously particle embolized with 1 vial of 300-500 micron Embospheres. The microcatheter was retracted to the more proximal aspect of the accessory left hepatic artery and a post embolization arteriogram was performed Next, the microcatheter was utilized to select the proper hepatic artery and a selective proper hepatic arteriogram was performed. Prolonged efforts were made with a fathom 67, a fathom 100 and a GT glidewire to cannulate the left hepatic artery and while adequate wire purchase was achieved within the vessel, neither a Progreat or high-flow Renegade microcatheter could be  catheterized within the vessel secondary to marked angulation and suspected narrowing of the vessel's origin. At this time, the decision was made to proceed with Y 90 radioembolization of the right lobe of the liver. The high-flow Renegade microcatheter was advanced to the main bifurcation of the right hepatic artery and a sub selective right hepatic arteriogram was performed. Radioembolization was then performed with Yttrium-90 SIR Spheres. Particles were administered via a microcatheter utilizing a completely enclosed system. Monitoring of antegrade flow was performed during and after the administration under fluoroscopy with use of contrast intermittently. After the administration of the particles, the microcatheter, outer catheter and administration system were then discarded into radiation safety receptacle. At this point, the procedure was terminated. The right common femoral approach vascular sheath was removed and hemostasis was achieved with manual compression. A dressing  was applied. The patient tolerated procedure well without immediate postprocedural complication. All participants involved with the procedure were surveyed by the radiation safety officer prior to exiting the fluoroscopy suite. The patient was escorted to the nuclear medicine department for post-treatment imaging. FINDINGS: Despite a markedly tortuous origin, ultimately, the left gastric artery was successfully catheterized with sub selective injection demonstrating a large accessory left hepatic artery arising from its distal aspect. 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 As above, despite prolonged efforts, the left hepatic artery could NOT be catheterized despite  adequate wire purchase secondary to marked tortuosity, angulation and potential narrowing of the vessel's origin. Successful Y 90 radioembolization of the right hepatic artery at the level of the vessel's proximal bifurcation. Intermittent contrast injections during and after the radioembolization confirming preserved antegrade flow without reflux. IMPRESSION: 1. Technically successful percutaneous bland particle embolization of the accessory left hepatic artery arising from the left gastric artery. 2. Technically successful radioembolization of the right hepatic artery with Yttrium-90 microspheres. PLAN: Given inability to successfully catheterize the left hepatic artery as well as the patient's chronic renal insufficiency we will NOT pursue additional hepatic directed therapy at this time. Initial imaging (likely with PET-CT), we will be performed in conjunction with referring oncologist, Dr. Burr Medico, in approximately 3 months (mid May 2023). Electronically Signed   By: Sandi Mariscal M.D.   On: 05/02/2021 17:27   NM Radio Pharm Therapy Intraarterial  Result Date: 05/02/2021 CLINICAL DATA:  Unresectable cholangiocarcinoma within the central liver. Yttrium 90 microsphere radio embolization treatment EXAM: NUCLEAR MEDICINE SPECIAL MED RAD PHYSICS CONS; NUCLEAR MEDICINE RADIO PHARM THERAPY INTRA ARTERIAL; NUCLEAR MEDICINE TREATMENT PROCEDURE; NUCLEAR MEDICINE LIVER SCAN TECHNIQUE: In conjunction with the interventional radiologist a Y- Microsphere dose was calculated utilizing body surface area formulation and partition formula. 3D post processing volume calculations applied. Calculated dose equal 17.3 mCi. Pre therapy MAA liver SPECT scan and CTA were evaluated. Utilizing a microcatheter system, the hepatic artery was selected and Y-90 microspheres were delivered in fractionated aliquots. Radiopharmaceutical was delivered by the interventional radiologist and nuclear radiologist. The patient tolerated procedure well.  No adverse effects were noted. Bremsstrahlung planar and SPECT imaging of the abdomen following intrahepatic arterial delivery of Y-90 microsphere was performed. RADIOPHARMACEUTICALS:  93.23 millicuries yttrium 90 microspheres COMPARISON:  MAA scan 04/18/2021, angiography 04/18/2021, CT 10/15/2021 22 FINDINGS: Y - 90 microspheres therapy as above. First therapy the right hepatic lobe. Bremsstrahlung planar and SPECT imaging of the abdomen following intrahepatic arterial delivery of Y-65mcrosphere demonstrates radioactivity localized to the RIGHT and central LEFT hepatic lobe. No evidence of extrahepatic activity. IMPRESSION: Successful Y - 90 microsphere delivery for treatment of unresectable liver metastasis. First therapy to the RIGHT lobe. Bremssstrahlung scan demonstrates activity localized to RIGHT hepatic lobe and central LEFT hepatic lobe. Focal accumulation within the neoplasm identified. No extrahepatic activity identified. Electronically Signed   By: SSuzy BouchardM.D.   On: 05/02/2021 14:28    Labs:  CBC: Recent Labs    04/09/21 0831 04/18/21 0825 05/02/21 0730 05/14/21 0823  WBC 6.2 5.9 6.4 5.9  HGB 11.1* 11.2* 11.0* 10.3*  HCT 34.8* 34.9* 34.4* 32.6*  PLT 187 205 185 236    COAGS: Recent Labs    04/18/21 0825 05/02/21 0730  INR 1.2 1.1    BMP: Recent Labs  04/09/21 0831 04/18/21 0825 05/02/21 0730 05/14/21 0823  NA 143 137 137 140  K 4.2 4.4 4.2 4.3  CL 109 106 105 106  CO2 _0 GLUCOSE 96 89 101* 145*  BUN 49* 55* 60* 39*  CALCIUM 9.1 8.9 8.7* 8.9  CREATININE 1.79* 1.91* 2.25* 1.62*  GFRNONAA 28* 26* 21* 32*    LIVER FUNCTION TESTS: Recent Labs    04/09/21 0831 04/18/21 0825 05/02/21 0730 05/14/21 0823  BILITOT 0.3 0.5 0.2* 0.3  AST _1 ALT _2 ALKPHOS 79 74 73 76  PROT 7.2 7.2 7.3 6.9  ALBUMIN 3.7 3.6 3.6 3.6    TUMOR MARKERS: No results for input(s): AFPTM, CEA, CA199, CHROMGRNA in the last 8760  hours.  Assessment and Plan:  Donna May is a 81 y.o. female with past medical history significant for chronic kidney disease, diabetes, hyperlipidemia, hypertension, breast cancer, peripheral neuropathy, chronic diastolic CHF, who presents today to the interventional radiology clinic post bland/Y90 radioembolization for progressive intrahepatic cholangiocarcinoma performed on 2/17.   Note, the initial plan was to perform Y90 radioembolization of the conventional left hepatic artery, however this ultimately proved unsuccessful secondary to inability to cannulate this vessel with a microcatheter.  Patient states that she initially experienced some mild epigastric abdominal pain however this resolved after approximately 2 to 3 days.  She otherwise tolerated procedure well with no significant change in appetite or energy level.  Patient states she is in her baseline state of health.  Given inability to successfully catheterize the left hepatic artery as well as the patient's chronic renal insufficiency, she is NOT a candidate for additional liver directed therapy at this time.  Given progression of disease demonstrated on planning CT of the abdomen pelvis performed 03/07/2021 (even when compared to recent PET/CT performed 01/02/2021), I feel it is reasonable to proceed with systemic therapy at the discretion of Dr. Burr Medico.    Fortunately, the patient's bilirubin level remains within normal limits also with return of her creatinine to her preprocedural baseline, presently at 1.6 (laboratory values from 05/14/2021).  Ultimately, initial posttreatment imaging will be performed with PET/CT 3 months following the Y90 radial embolization (mid May 2023).  Plan: - Refer patient back to Dr. Burr Medico for reinitiation of systemic therapy. - Initial postprocedural PET/CT imaging 3 months following the Y-90 radioembolization (mid May 2023).  A copy of this report was sent to the requesting provider on this  date.  Electronically Signed: Sandi Mariscal 05/22/2021, 9:30 AM   I spent a total of 15 Minutes in face to face in clinical consultation, greater than 50% of which was counseling/coordinating care for post Y-90.

## 2021-05-24 DIAGNOSIS — Z20828 Contact with and (suspected) exposure to other viral communicable diseases: Secondary | ICD-10-CM | POA: Diagnosis not present

## 2021-05-27 ENCOUNTER — Telehealth: Payer: Self-pay

## 2021-05-27 NOTE — Telephone Encounter (Signed)
This nurse reached out to patient and advised per providers to restart taking her Tibsovo.  Patient states that she will restart taking it on tomorrow.  No further questions or concerns noted at this time.     ?

## 2021-05-28 DIAGNOSIS — Z20828 Contact with and (suspected) exposure to other viral communicable diseases: Secondary | ICD-10-CM | POA: Diagnosis not present

## 2021-05-30 ENCOUNTER — Ambulatory Visit (HOSPITAL_COMMUNITY): Payer: Medicare Other

## 2021-05-30 ENCOUNTER — Other Ambulatory Visit (HOSPITAL_COMMUNITY): Payer: Medicare Other

## 2021-06-04 ENCOUNTER — Telehealth: Payer: Self-pay

## 2021-06-04 NOTE — Telephone Encounter (Signed)
Pt called stating she is having severe neuropathy in her hands, feet, and especially her Rt arm where she has lymphedema.  Pt is currently taking Tibsovo which she restarted taking on 05/29/2021.  Pt stated her neuropathy started to get aggrevated again once she restarted the Tibsovo.  Pt stated she stopped taking the Tibsovo on 05/30/2021 d/t the increased neuropathy.  Per discussion with pt, she had stopped taking the Tibsovo previously d/t this same side effect which was approved by Dr. Burr Medico.  Pt denied taking her prescribed Gabapentin d/t pt states it makes her sleepy.  Pt denied taking her Baclofen.  Pt was aware she had been prescribed Baclofen previously (12/30/2020).  Pt denied taking any pain medication d/t pt stated she was scared to take pain medication since she had her Y90 procedure.  Pt denied using warm compresses or towel on her hands, feet, and RT arm for neuropathy.  Recommended that pt start using warm compresses.  Also recommended that pt restart taking her Gabapentin to help with the neuropathy.  Pt verbalized understanding and stated she will try taking the Gabapentin.  Pt asked if she could continue to not take Tibsovo until she starts to feel better (meaning once her neuropathy has gotten better).  Informed pt that Dr. Burr Medico will have to make that decision which this RN will notify Dr. Burr Medico of the pt's complaints.  Informed pt that this RN will call her with Dr. Ernestina Penna decision. ?

## 2021-06-06 ENCOUNTER — Ambulatory Visit (INDEPENDENT_AMBULATORY_CARE_PROVIDER_SITE_OTHER): Payer: Medicare Other | Admitting: Podiatry

## 2021-06-06 ENCOUNTER — Encounter: Payer: Self-pay | Admitting: Podiatry

## 2021-06-06 ENCOUNTER — Other Ambulatory Visit: Payer: Self-pay

## 2021-06-06 DIAGNOSIS — N1832 Chronic kidney disease, stage 3b: Secondary | ICD-10-CM | POA: Diagnosis not present

## 2021-06-06 DIAGNOSIS — B351 Tinea unguium: Secondary | ICD-10-CM

## 2021-06-06 DIAGNOSIS — M79675 Pain in left toe(s): Secondary | ICD-10-CM

## 2021-06-06 DIAGNOSIS — E1122 Type 2 diabetes mellitus with diabetic chronic kidney disease: Secondary | ICD-10-CM | POA: Diagnosis not present

## 2021-06-06 DIAGNOSIS — M79674 Pain in right toe(s): Secondary | ICD-10-CM | POA: Diagnosis not present

## 2021-06-06 NOTE — Progress Notes (Signed)
This patient returns to my office for at risk foot care.  This patient requires this care by a professional since this patient will be at risk due to having diabetes and peripheral neuropathy due to chemo.  This patient is unable to cut nails herself since the patient cannot reach hiernails.These nails are painful walking and wearing shoes.  This patient presents for at risk foot care today. ? ?General Appearance  Alert, conversant and in no acute stress. ? ?Vascular  Dorsalis pedis  pulses are weakly  palpable  bilaterally. Posterior tibial pulses are absent  B/L. Capillary return is within normal limits  Bilaterally.Cold feet  Bilaterally.Absent digital hair B/L. ? ?Neurologic  Senn-Weinstein monofilament wire test diminished   bilaterally. Muscle power within normal limits bilaterally. ? ?Nails Thick disfigured discolored nails with subungual debris  from second  to fifth toes bilaterally. No evidence of bacterial infection or drainage bilaterally. ? ?Orthopedic  No limitations of motion  feet .  No crepitus or effusions noted.  No bony pathology or digital deformities noted.  HAV  B/L ? ?Skin  normotropic skin with no porokeratosis noted bilaterally.  No signs of infections or ulcers noted.    ? ?Onychomycosis  Pain in right toes  Pain in left toes ? ?Consent was obtained for treatment procedures.   Mechanical debridement of nails 1-5  bilaterally performed with a nail nipper.  Filed with dremel without incident.   ? ? ?Return office visit   4   months                   Told patient to return for periodic foot care and evaluation due to potential at risk complications. ? ? ?Gardiner Barefoot DPM  ?

## 2021-06-11 ENCOUNTER — Inpatient Hospital Stay: Payer: Medicare Other

## 2021-06-11 ENCOUNTER — Encounter: Payer: Self-pay | Admitting: Hematology

## 2021-06-11 ENCOUNTER — Inpatient Hospital Stay (HOSPITAL_BASED_OUTPATIENT_CLINIC_OR_DEPARTMENT_OTHER): Payer: Medicare Other | Admitting: Hematology

## 2021-06-11 ENCOUNTER — Other Ambulatory Visit: Payer: Self-pay

## 2021-06-11 VITALS — BP 157/63 | HR 68 | Temp 98.6°F | Ht 65.0 in | Wt 130.8 lb

## 2021-06-11 DIAGNOSIS — T451X5A Adverse effect of antineoplastic and immunosuppressive drugs, initial encounter: Secondary | ICD-10-CM

## 2021-06-11 DIAGNOSIS — C221 Intrahepatic bile duct carcinoma: Secondary | ICD-10-CM

## 2021-06-11 DIAGNOSIS — Z95828 Presence of other vascular implants and grafts: Secondary | ICD-10-CM

## 2021-06-11 DIAGNOSIS — G620 Drug-induced polyneuropathy: Secondary | ICD-10-CM | POA: Diagnosis not present

## 2021-06-11 DIAGNOSIS — Z79899 Other long term (current) drug therapy: Secondary | ICD-10-CM | POA: Diagnosis not present

## 2021-06-11 DIAGNOSIS — C786 Secondary malignant neoplasm of retroperitoneum and peritoneum: Secondary | ICD-10-CM | POA: Diagnosis not present

## 2021-06-11 DIAGNOSIS — N183 Chronic kidney disease, stage 3 unspecified: Secondary | ICD-10-CM | POA: Diagnosis not present

## 2021-06-11 LAB — CBC WITH DIFFERENTIAL (CANCER CENTER ONLY)
Abs Immature Granulocytes: 0.02 10*3/uL (ref 0.00–0.07)
Basophils Absolute: 0 10*3/uL (ref 0.0–0.1)
Basophils Relative: 0 %
Eosinophils Absolute: 0.3 10*3/uL (ref 0.0–0.5)
Eosinophils Relative: 5 %
HCT: 33.3 % — ABNORMAL LOW (ref 36.0–46.0)
Hemoglobin: 10.6 g/dL — ABNORMAL LOW (ref 12.0–15.0)
Immature Granulocytes: 0 %
Lymphocytes Relative: 17 %
Lymphs Abs: 1 10*3/uL (ref 0.7–4.0)
MCH: 29 pg (ref 26.0–34.0)
MCHC: 31.8 g/dL (ref 30.0–36.0)
MCV: 91 fL (ref 80.0–100.0)
Monocytes Absolute: 0.9 10*3/uL (ref 0.1–1.0)
Monocytes Relative: 15 %
Neutro Abs: 3.8 10*3/uL (ref 1.7–7.7)
Neutrophils Relative %: 63 %
Platelet Count: 173 10*3/uL (ref 150–400)
RBC: 3.66 MIL/uL — ABNORMAL LOW (ref 3.87–5.11)
RDW: 16.8 % — ABNORMAL HIGH (ref 11.5–15.5)
WBC Count: 6.1 10*3/uL (ref 4.0–10.5)
nRBC: 0 % (ref 0.0–0.2)

## 2021-06-11 LAB — CMP (CANCER CENTER ONLY)
ALT: 10 U/L (ref 0–44)
AST: 21 U/L (ref 15–41)
Albumin: 3.7 g/dL (ref 3.5–5.0)
Alkaline Phosphatase: 87 U/L (ref 38–126)
Anion gap: 5 (ref 5–15)
BUN: 40 mg/dL — ABNORMAL HIGH (ref 8–23)
CO2: 29 mmol/L (ref 22–32)
Calcium: 9.1 mg/dL (ref 8.9–10.3)
Chloride: 105 mmol/L (ref 98–111)
Creatinine: 1.69 mg/dL — ABNORMAL HIGH (ref 0.44–1.00)
GFR, Estimated: 30 mL/min — ABNORMAL LOW (ref >60)
Glucose, Bld: 82 mg/dL (ref 70–99)
Potassium: 4.3 mmol/L (ref 3.5–5.1)
Sodium: 139 mmol/L (ref 135–145)
Total Bilirubin: 0.4 mg/dL (ref 0.3–1.2)
Total Protein: 7.2 g/dL (ref 6.5–8.1)

## 2021-06-11 MED ORDER — HEPARIN SOD (PORK) LOCK FLUSH 100 UNIT/ML IV SOLN
500.0000 [IU] | Freq: Once | INTRAVENOUS | Status: AC
  Administered 2021-06-11: 500 [IU]

## 2021-06-11 MED ORDER — SODIUM CHLORIDE 0.9% FLUSH
10.0000 mL | Freq: Once | INTRAVENOUS | Status: AC
  Administered 2021-06-11: 10 mL

## 2021-06-11 NOTE — Progress Notes (Signed)
?[facility]   ?[phone] [phone]   ?Clinic Follow up Note  ? ?Patient Care Team: ?[name], MD as PCP - General (Family Medicine) ?[name], [name], MD as PCP - Cardiology (Cardiology) ?[name], [name], MD as Consulting Physician (Cardiology) ?[name], MD as Consulting Physician (Obstetrics and Gynecology) ?Gardenia Phlegm, NP as Nurse Practitioner (Hematology and Oncology) ?[facility] [employer] [facility] ?[name], MD as Consulting Physician (Hematology) ?[name], [name], MD as Consulting Physician (Gastroenterology) ?Placke, Dawn, RN as Oncology Nurse Navigator ?[name], MD as Consulting Physician (General Surgery) ? ?Date of Service:  06/11/2021 ? ?CHIEF COMPLAINT: f/u of cholangiocarcinoma ? ?CURRENT THERAPY:  ?Surveillance, s/p Y90 05/02/21 ? ?ASSESSMENT & PLAN:  ?Donna May is a 81 y.o. female with  ? ?1. Intrahepatic cholangiocarcinoma, cT1N0M1, with peritoneal metastasis, MSS, IDH1 mutation (+) ?-Diagnosed in 09/2018; biopsy of her liver mass shows adenocarcinoma, most consistent with cholangiocarcinoma. 09/29/18 PET scan showed no evidence of distant metastasis. ?-she was found to have peritoneal metastasis to right diaphragm and surgery was aborted. ?-Her CT AP from 11/29/18 shows dominate liver mass stable to mild increase, mild nodularity of both adrenal glands which warrants being monitored.  No visible peritoneal metastasis on CT scan. ?-FO results showed MSI stable disease, IDH mutation positive. Consider IDH 1 inhibitor in the future.   ?-She began first line oxaliplatin (due to CKD) and gemcitabine every 2 weeks on 11/30/18. Oxali was dose reduced and eventually stopped after cycle 11. She received single agent gemcitabine with cycle 12. Cisplatin added with C13 on 05/17/19 but neuropathy worsened and stopped on 06/14/19. She received gemcitabine every 2 weeks starting from 06/28/19 C16. She took chemo break from 12/2019 -  03/2020 ?-PET from 03/14/20 showed concern for disease progression in liver, she restarted maintenance gemcitabine every 2 weeks from 04/03/20. ?-I added durvalumab to gemcitabine, due to recurrent disease on PET 09/2020. She tolerated well overall, except persistent neuropathy  ?-restaging PET on 01/02/21 showed further progression of cancer in liver. I stopped gemcitabine  ?-she met with IR on 01/30/21 to discuss possible Y90. She was felt to be a good candidate. ?-CT AP 03/07/21 showed mild cancer progression in liver, no other new mets ?-she was started on ivosidenib 500 mg daily on 02/25/21 while awaiting Y90. She is tolerating well, aside from possibly some increased neuropathy.  ?-she underwent left lobe Y90 on 05/02/21; she tolerated well overall. Dr. Pascal Lux does not plan for additional therapy given inability to cauterize left hepatic artery.  ?-she reports continued worsening of her neuropathy on the ivosidenib. We will hold this for now and see how she does. ?-plan for repeat scan in 07/2021, I will order at next visit ?-per pt's request, we will see her every 3 weeks  ?  ?2. Grade 3 CIPN  ?-secondary to Oxaliplatin, eventually dc'd after cycle 11, and cisplatin, dc'd after 2 cycles.  Her diabetes may contribute to it also. ?-low efficacy of gabapentin and cymbalta, also tried lyrica briefly. She completed PT for balance. She uses gabapentin as needed, mainly for sleep. ?-Follow-up open with Dr. Mickeal Skinner, pt did not find helpful. Also seen by chiropractor. I also suggested acupuncture, she will think about it  ?-she reports aggravated tingling to her arms and legs on the ivosidenib. We will discontinue to see if she sees improvement. ?  ?3. H/o right breast cancer, Genetics negative  ?-s/p mastectomy in 1992, per patient did not require adjuvant therapy ?-previously followed by Dr. Jana Hakim  ?-  VUS of gene APC; genetics otherwise normal ?  ?4. Comorbidities: DM, HTN, hiatal hernia, GERD, renal artery stenosis,  CHF ?-Follow-up with PCP, nephrology, and Dr. Dwyane Dee, and cardiology ?-stable, well controlled ?  ?5. Goal of care discussion ?-We frequently discuss the goal of her treatment is palliative and that her cancer is not curable with chemo alone at this stage. The goal is to control her disease, prolong her life, and give her good quality of life. She understands ?-she is full code now  ?  ?6. CKD Stage III ?-Scr previously stable at 1.3 - 1.6 ?-worsened prior to Y90 procedure, reaching 2.25 the day of procedure. Today creatinine is 1.69 (06/11/21). ?  ?  ?PLAN: ?-hold Ivosidenib due to worsening neuropahty  ?-I encouraged her to try acupuncture for neuropathy  ?-lab, flush, and f/u in 3 weeks, pt prefers close f/u  ? -will order restaging scan at that visit ? ? ?No problem-specific Assessment & Plan notes found for this encounter. ? ? ?SUMMARY OF ONCOLOGIC HISTORY: ?Oncology History Overview Note  ?Cancer Staging ?Intrahepatic cholangiocarcinoma ([place]) ?Staging form: Intrahepatic Bile Duct, AJCC 8th Edition ?- Clinical stage from 09/23/2018: Stage IB (cT1b, cN0, cM0) - Signed by [name], MD on 10/06/2018 ?Histologic grade (G): G3 ?Histologic grading system: 3 grade system ? ?  ?Intrahepatic cholangiocarcinoma ([place])  ?09/07/2018 Imaging  ? CT Chest IMPRESSION: ?1. New, enhancing mass involving segment 4 of the liver and fundus of gallbladder is concerning for malignancy. This may represent either metastatic disease from breast cancer or neoplasm primary to the liver or hepatic biliary tree. Further evaluation with contrast enhanced CT of the abdomen and pelvis is recommended. ?2. No findings to suggest metastatic disease within the chest. ?3.  Aortic Atherosclerosis (ICD10-I70.0). ?4. Coronary artery calcifications. ?  ?09/13/2018 Pathology Results  ? Diagnosis ?Liver, needle/core biopsy ?- ADENOCARCINOMA. ?Microscopic Comment ?Immunohistochemistry for CK7 is positive. CK20, TTF1, CDX-2, GATA-3, PAX 8, Qualitative ER, p63  and CK5/6 are negative. The provided clinical history of remote mammary carcinoma is noted. Based on the morphology and immunophenotype of the adenocarcinoma observed in this specimen, primary cholangiocarcinoma is favored. Clinical and radiologic correlation are  encouraged. Results reported to [employer] on 09/15/2018. Intradepartmental consultation (Dr. Vic Ripper). ?  ?09/13/2018 Initial Diagnosis  ? Cholangiocarcinoma ([place]) ?  ?09/23/2018 Procedure  ? Colonoscopy by Dr. Havery Moros 09/23/18  ?IMPRESSION ?- Two 3 to 4 mm polyps in the ascending colon, removed with a cold snare. Resected and ?retrieved. ?- Five 3 to 5 mm polyps in the transverse colon, removed with a cold snare. Resected and ?retrieved. ?- One 5 mm polyp at the splenic flexure, removed with a cold snare. Resected and retrieved. ?- Three 3 to 5 mm polyps in the sigmoid colon, removed with a cold snare. Resected and ?retrieved. ?- The examination was otherwise normal. ?Upper Endopscy by Dr. Havery Moros 09/23/18  ?IMPRESSION ?- Esophagogastric landmarks identified. ?- 2 cm hiatal hernia. ?- Normal esophagus otherwise. ?- A single gastric polyp. Resected and retrieved. ?- Mild gastritis. Biopsied. ?- Normal duodenal bulb and second portion of the duodenum. ?  ?09/23/2018 Pathology Results  ? Diagnosis 09/23/18 ?1. Surgical [P], duodenum ?- BENIGN SMALL BOWEL MUCOSA. ?- NO ACTIVE INFLAMMATION OR VILLOUS ATROPHY IDENTIFIED. ?2. Surgical [P], stomach, polyp ?- HYPERPLASTIC POLYP(S). ?- THERE IS NO EVIDENCE OF MALIGNANCY. ?3. Surgical [P], gastric antrum and gastric body ?- CHRONIC INACTIVE GASTRITIS. ?- THERE IS NO EVIDENCE OF HELICOBACTER-PYLORI, DYSPLASIA, OR MALIGNANCY. ?- SEE COMMENT. ?4. Surgical [P], colon, sigmoid,  splenic flexure, transverse and ascending, polyp (9) ?- TUBULAR ADENOMA(S). ?- SESSILE SERRATED POLYP WITHOUT CYTOLOGIC DYSPLASIA. ?- HIGH GRADE DYSPLASIA IS NOT IDENTIFIED. ?5. Surgical [P], colon, sigmoid, polyp (2) ?- HYPERPLASTIC  POLYP(S). ?- THERE IS NO EVIDENCE OF MALIGNANCY. ?  ?09/23/2018 Cancer Staging  ? Staging form: Intrahepatic Bile Duct, AJCC 8th Edition ?- Clinical stage from 09/23/2018: Stage IB (cT1b, cN0, cM0) - Signed by Ky Barban

## 2021-06-13 ENCOUNTER — Telehealth: Payer: Self-pay | Admitting: Cardiovascular Disease

## 2021-06-13 DIAGNOSIS — Z20822 Contact with and (suspected) exposure to covid-19: Secondary | ICD-10-CM | POA: Diagnosis not present

## 2021-06-13 MED ORDER — VALSARTAN 320 MG PO TABS: 320.0000 mg | ORAL_TABLET | Freq: Every day | ORAL | 3 refills | Status: AC

## 2021-06-13 NOTE — Telephone Encounter (Signed)
?*  STAT* If patient is at the pharmacy, call can be transferred to refill team. ? ? ?1. Which medications need to be refilled? (please list name of each medication and dose if known) valsartan (DIOVAN) 320 MG tablet ? ?2. Which pharmacy/location (including street and city if local pharmacy) is medication to be sent to? [id number]- [place], [place] - [address] AT [place]? ?3. Do they need a 30 day or 90 day supply? 90 ? ?

## 2021-06-19 DIAGNOSIS — Z20822 Contact with and (suspected) exposure to covid-19: Secondary | ICD-10-CM | POA: Diagnosis not present

## 2021-06-25 ENCOUNTER — Ambulatory Visit (INDEPENDENT_AMBULATORY_CARE_PROVIDER_SITE_OTHER): Payer: Medicare Other | Admitting: Family Medicine

## 2021-06-25 VITALS — BP 131/50 | HR 62 | Temp 99.0°F | Wt 130.8 lb

## 2021-06-25 DIAGNOSIS — E1122 Type 2 diabetes mellitus with diabetic chronic kidney disease: Secondary | ICD-10-CM

## 2021-06-25 DIAGNOSIS — R300 Dysuria: Secondary | ICD-10-CM

## 2021-06-25 DIAGNOSIS — N1832 Chronic kidney disease, stage 3b: Secondary | ICD-10-CM | POA: Diagnosis not present

## 2021-06-25 DIAGNOSIS — T451X5A Adverse effect of antineoplastic and immunosuppressive drugs, initial encounter: Secondary | ICD-10-CM | POA: Diagnosis not present

## 2021-06-25 DIAGNOSIS — C221 Intrahepatic bile duct carcinoma: Secondary | ICD-10-CM | POA: Diagnosis not present

## 2021-06-25 DIAGNOSIS — I5032 Chronic diastolic (congestive) heart failure: Secondary | ICD-10-CM | POA: Diagnosis not present

## 2021-06-25 DIAGNOSIS — G620 Drug-induced polyneuropathy: Secondary | ICD-10-CM | POA: Diagnosis not present

## 2021-06-25 DIAGNOSIS — I10 Essential (primary) hypertension: Secondary | ICD-10-CM | POA: Diagnosis not present

## 2021-06-25 LAB — POCT GLYCOSYLATED HEMOGLOBIN (HGB A1C): Hemoglobin A1C: 5.6 % (ref 4.0–5.6)

## 2021-06-25 LAB — POCT URINALYSIS DIPSTICK
Bilirubin, UA: NEGATIVE
Blood, UA: NEGATIVE
Glucose, UA: NEGATIVE
Ketones, UA: NEGATIVE
Nitrite, UA: NEGATIVE
Protein, UA: NEGATIVE
Spec Grav, UA: 1.02 (ref 1.010–1.025)
Urobilinogen, UA: NEGATIVE E.U./dL — AB
pH, UA: 6 (ref 5.0–8.0)

## 2021-06-25 NOTE — Progress Notes (Signed)
Subjective:  ? ? Patient ID: Donna May, female    DOB: 1941/01/21, 81 y.o.   MRN: [id number] ? ?Chief Complaint  ?Patient presents with  ? Follow-up  ?  DM  ? ? ?HPI ?Patient is an 81 yo female with pmh sig for diastolic CHF, HTN, renal artery stenosis, hiatal hernia, GERD, arthritis, h/o right breast cancer status post right mastectomy, CKD III, R breast cancer, intrahepatic cholangiocarcinoma with peritoneal mets on palliative care, chemo induced peripheral neuropathy, lymphedema, h/o depression who was lost to f/u, seen today for f/u on chronic conditions. ? ?Pt endorses burning sensation at the end of urination x 1 wk. ? ?Pt given rx for crestor 20 mg.  Picked up med but has not started it.  ? ?Taking Januvia 100 mg daily for blood sugar.  Not checking at home. ? ?Pt s/p Y90 on 05/02/21 for intrahepatic cholangiocarcinoma.  Pt dx'd 09/2018 after bx of liver mass. Patient endorses continued numbness/tingling in extremities.  The pain/discomfort is affecting patient's quality of life, which is contributing to the score on the depression screening.  Gabapentin not helping. ? ?Past Medical History:  ?Diagnosis Date  ? Anemia   ? Anxiety   ? Arthritis   ? Breast cancer ([place]) 1992  ? right  ? Cataract   ? Bilateral eyes - surgery to remove  ? Chronic diastolic CHF (congestive heart failure) ([place])   ? Chronic kidney disease   ? CKD stage 3  ? Complication of anesthesia   ? "something they use make me itch for a couple of days."  ? Depression   ? Diabetes mellitus   ? type 2  ? Duodenitis 01/18/2002  ? Fainting spell   ? GERD (gastroesophageal reflux disease)   ? Heart murmur   ? never has caused any problems  ? Hiatal hernia 08/08/2008, 01/18/2002  ? History of pneumonia   ? x 2  ? Hyperlipidemia   ? Hypertension   ? Liver cancer ([place]) 08/2018  ? chemotherapy  ? Lymphedema 2017  ? Right arm  ? Peripheral neuropathy   ? ? ?No Known Allergies ? ?ROS ?General: Denies fever, chills, night sweats, changes in weight, changes  in appetite ?HEENT: Denies headaches, ear pain, changes in vision, rhinorrhea, sore throat ?CV: Denies CP, palpitations, SOB, orthopnea ?Pulm: Denies SOB, cough, wheezing ?GI: Denies abdominal pain, nausea, vomiting, diarrhea, constipation ?GU: Denies hematuria, frequency, vaginal discharge  + dysuria ?Msk: Denies muscle cramps, joint pains ?Neuro: Denies weakness  + numbness, tingling in extremities ?Skin: Denies rashes, bruising ?Psych: Denies depression, anxiety, hallucinations ? ?   ?Objective:  ?  ?Blood pressure (!) 131/50, pulse 62, temperature 99 ?F (37.2 ?C), temperature source Oral, weight 130 lb 12.8 oz (59.3 kg), SpO2 100 %. ? ?Gen. Pleasant, well-nourished, in no distress, normal affect   ?HEENT: [place]/AT, face symmetric, conjunctiva clear, no scleral icterus, PERRLA, EOMI, nares patent without drainage, pharynx without erythema or exudate. ?Lungs: no accessory muscle use, CTAB, no wheezes or rales ?Cardiovascular: RRR, no m/r/g, no peripheral edema ?Abdomen: BS present, soft, NT/ND, no hepatosplenomegaly. ?Musculoskeletal: No deformities, no cyanosis or clubbing, normal tone ?Neuro:  A&Ox3, CN II-XII intact, normal gait ?Skin:  Warm, no lesions/ rash ? ? ?Wt Readings from Last 3 Encounters:  ?06/11/21 130 lb 12.8 oz (59.3 kg)  ?05/14/21 131 lb 9.6 oz (59.7 kg)  ?05/02/21 130 lb (59 kg)  ? ? ?Lab Results  ?Component Value Date  ? WBC 6.1 06/11/2021  ? HGB 10.6 (  L) 06/11/2021  ? HCT 33.3 (L) 06/11/2021  ? PLT 173 06/11/2021  ? GLUCOSE 82 06/11/2021  ? CHOL 231 (H) 03/28/2021  ? TRIG 68 03/28/2021  ? HDL 72 03/28/2021  ? LDLCALC 147 (H) 03/28/2021  ? ALT 10 06/11/2021  ? AST 21 06/11/2021  ? NA 139 06/11/2021  ? K 4.3 06/11/2021  ? CL 105 06/11/2021  ? CREATININE 1.69 (H) 06/11/2021  ? BUN 40 (H) 06/11/2021  ? CO2 29 06/11/2021  ? TSH 2.065 10/02/2020  ? INR 1.1 05/02/2021  ? HGBA1C 4.9 12/28/2019  ? MICROALBUR 0.9 09/01/2018  ? ? ?  06/25/2021  ? 12:19 PM 04/11/2020  ? 11:09 AM 11/09/2019  ?  4:16 PM   ?Depression screen PHQ 2/9  ?Decreased Interest  0 0  ?Down, Depressed, Hopeless  0 0  ?PHQ - 2 Score  0 0  ?Altered sleeping 2    ?Tired, decreased energy 2    ?Change in appetite 3    ?Feeling bad or failure about yourself  3    ?Trouble concentrating 2    ?Moving slowly or fidgety/restless 0    ? ? ?Assessment/Plan: ? ?Type 2 diabetes mellitus with stage 3b chronic kidney disease, without long-term current use of insulin ([place]) ?-GFR 32 on 06/14/2021 ?-Hemoglobin A1c 5.6% this visit ?-Discussed decreasing/discontinuing Januvia.  Patient wishes to continue on lower dose. ?-We will start Januvia 50 mg daily. ?-Continue lifestyle modifications ?- Plan: POCT glycosylated hemoglobin (Hb A1C), POCT urinalysis dipstick, Culture, Urine ? ?Dysuria  ?-UA with trace leuks ?-Discussed obtaining urine culture. ?-We will start ABX if needed based on culture results. ?-Given strict precautions ?- Plan: POCT urinalysis dipstick, Culture, Urine ? ?Chronic diastolic congestive heart failure ([place]) ?-Euvolemic on exam ?-Continue current medications including Coreg 25 mg twice daily, Norvasc 10 mg, Lasix 40 mg, hydralazine 100 mg, valsartan 320 mg ?-Continue lifestyle modification sodium intake ?-Continue follow-up with cardiology ? ?Essential hypertension ?-Controlled ?-Consider adjusting medications as may cause decreased perfusion to kidneys with  diastolic BP 50 in clinic ?-For now continue current meds including Coreg 25 mg twice daily, Norvasc 10 mg, Lasix 40 mg, hydralazine 100 mg 3 times daily, valsartan 320 mg daily ? ?Chemotherapy-induced peripheral neuropathy ([place]) ?-Discuss additional options with oncology as continue neuropathy affecting patient's quality of life. ?-PHQ-9 partially completed this visit. ?-Continue to monitor closely ?-For now continue gabapentin ? ?Intrahepatic cholangiocarcinoma ([place]) ?-[id number], with peritoneal metastasis, MSS, IDH 1 mutation(+) ?-dx'd 09/2018 after bx of liver mass with adenocarcinoma.   PET scan 09/29/18 without evidence of mets.  Surgery aborted after peritoneal metastasis to right diaphragm noted.  CT abdomen pelvis 11/29/2018 with stable liver mass.  Started oxaliplatin (due to CKD) and gemcitabine, oxaliplatin dose reduced and eventually stopped.  Cisplatin adequacy 13 on 05/17/2019 but neuropathy worsened and stopped on 06/14/2019.  Received him set up on 03/30/2019 with a break from 12/2019-03/2020.  Concern for disease progression on PET scan from 03/14/2020.  Maintenance gemcitabine restarted 04/03/2020. ?-Durvalumab and gemcitabine added due to recurrent disease on PET scan from 09/2020.  Neuropathy persistent ?-Further progression of cancer and liver noted on restaging PET scan 01/02/2021.  Gemcitabine stopped. ?-CT AP 03/07/2021 with mild cancer progression ?Liver, no new mets ?-Ivosidenib 500 mg daily started 02/25/2021 while awaiting Y 90. ?-s/p left lobe Y90 on 05/02/21. ?-Continue palliative treatments. ?-Continue f/u with Dr. Burr Medico ? ?F/u prn in 4-6 months, sooner if needed. ? ?Grier Mitts, MD ?

## 2021-06-25 NOTE — Patient Instructions (Addendum)
Your hemoglobin A1C was 5.6%.   We can decrease the amount of Januvia you are taking from 100 mg to 50 mg daily.   ? ?

## 2021-06-26 LAB — URINE CULTURE
MICRO NUMBER:: 13254205
SPECIMEN QUALITY:: ADEQUATE

## 2021-06-27 ENCOUNTER — Telehealth: Payer: Self-pay | Admitting: Family Medicine

## 2021-06-27 NOTE — Telephone Encounter (Signed)
Patient called because they no longer make the strips needed for her blood sugar monitor. She talked to her insurance and patient would like another one order through medial device/supply company and that way her insurance will pay for it. She states that it must be a monitor that she can prick her finger with, not one that goes on her arm. ? ? ? ?Good callback number is [phone] ? ? ? ?Please advise  ?

## 2021-06-28 DIAGNOSIS — Z20828 Contact with and (suspected) exposure to other viral communicable diseases: Secondary | ICD-10-CM | POA: Diagnosis not present

## 2021-06-30 DIAGNOSIS — Z20822 Contact with and (suspected) exposure to covid-19: Secondary | ICD-10-CM | POA: Diagnosis not present

## 2021-07-01 MED ORDER — BLOOD GLUCOSE MONITOR KIT: PACK | 0 refills | Status: AC

## 2021-07-01 NOTE — Telephone Encounter (Signed)
Spoke with pt, wanted to have Rx sent to mail service. Rx sent in for supplies. ?

## 2021-07-02 ENCOUNTER — Telehealth: Payer: Self-pay | Admitting: Family Medicine

## 2021-07-02 NOTE — Telephone Encounter (Signed)
Ref #[id number] pharmacy requesting a scipt for cgm onetouch verio reflect, along with onetouch verio test strips and onetouch delica plus 33 gauge lancets. Pharmacy tech states you can call her at the number provided  ?

## 2021-07-02 NOTE — Telephone Encounter (Signed)
Spoke with Heidi, tech from [employer], gave VO for the one touch supplies. After speaking with her spoke with pharmacist, [place], who accepted orders and stated supplies will be shipped out.  ?

## 2021-07-02 NOTE — Telephone Encounter (Signed)
A user error has taken place: orders placed in error, not carried out on this patient.

## 2021-07-04 ENCOUNTER — Encounter: Payer: Self-pay | Admitting: Family Medicine

## 2021-07-07 DIAGNOSIS — R059 Cough, unspecified: Secondary | ICD-10-CM | POA: Diagnosis not present

## 2021-07-07 DIAGNOSIS — Z20822 Contact with and (suspected) exposure to covid-19: Secondary | ICD-10-CM | POA: Diagnosis not present

## 2021-07-07 DIAGNOSIS — R051 Acute cough: Secondary | ICD-10-CM | POA: Diagnosis not present

## 2021-07-09 ENCOUNTER — Inpatient Hospital Stay: Payer: Medicare Other | Attending: Adult Health | Admitting: Hematology

## 2021-07-09 ENCOUNTER — Encounter: Payer: Self-pay | Admitting: Hematology

## 2021-07-09 ENCOUNTER — Inpatient Hospital Stay: Payer: Medicare Other

## 2021-07-09 ENCOUNTER — Telehealth: Payer: Self-pay | Admitting: Hematology

## 2021-07-09 ENCOUNTER — Other Ambulatory Visit: Payer: Self-pay

## 2021-07-09 VITALS — BP 151/56 | HR 65 | Temp 98.5°F | Resp 18 | Wt 131.0 lb

## 2021-07-09 DIAGNOSIS — N183 Chronic kidney disease, stage 3 unspecified: Secondary | ICD-10-CM | POA: Diagnosis not present

## 2021-07-09 DIAGNOSIS — I890 Lymphedema, not elsewhere classified: Secondary | ICD-10-CM | POA: Insufficient documentation

## 2021-07-09 DIAGNOSIS — T451X5A Adverse effect of antineoplastic and immunosuppressive drugs, initial encounter: Secondary | ICD-10-CM | POA: Diagnosis not present

## 2021-07-09 DIAGNOSIS — Z95828 Presence of other vascular implants and grafts: Secondary | ICD-10-CM

## 2021-07-09 DIAGNOSIS — C221 Intrahepatic bile duct carcinoma: Secondary | ICD-10-CM | POA: Diagnosis not present

## 2021-07-09 DIAGNOSIS — C786 Secondary malignant neoplasm of retroperitoneum and peritoneum: Secondary | ICD-10-CM | POA: Insufficient documentation

## 2021-07-09 DIAGNOSIS — Z20822 Contact with and (suspected) exposure to covid-19: Secondary | ICD-10-CM | POA: Diagnosis not present

## 2021-07-09 DIAGNOSIS — G620 Drug-induced polyneuropathy: Secondary | ICD-10-CM | POA: Diagnosis not present

## 2021-07-09 DIAGNOSIS — Z79899 Other long term (current) drug therapy: Secondary | ICD-10-CM | POA: Insufficient documentation

## 2021-07-09 LAB — CBC WITH DIFFERENTIAL (CANCER CENTER ONLY)
Abs Immature Granulocytes: 0.03 10*3/uL (ref 0.00–0.07)
Basophils Absolute: 0 10*3/uL (ref 0.0–0.1)
Basophils Relative: 1 %
Eosinophils Absolute: 0.4 10*3/uL (ref 0.0–0.5)
Eosinophils Relative: 6 %
HCT: 33.3 % — ABNORMAL LOW (ref 36.0–46.0)
Hemoglobin: 10.9 g/dL — ABNORMAL LOW (ref 12.0–15.0)
Immature Granulocytes: 1 %
Lymphocytes Relative: 15 %
Lymphs Abs: 0.9 10*3/uL (ref 0.7–4.0)
MCH: 30.3 pg (ref 26.0–34.0)
MCHC: 32.7 g/dL (ref 30.0–36.0)
MCV: 92.5 fL (ref 80.0–100.0)
Monocytes Absolute: 0.8 10*3/uL (ref 0.1–1.0)
Monocytes Relative: 13 %
Neutro Abs: 4.2 10*3/uL (ref 1.7–7.7)
Neutrophils Relative %: 64 %
Platelet Count: 170 10*3/uL (ref 150–400)
RBC: 3.6 MIL/uL — ABNORMAL LOW (ref 3.87–5.11)
RDW: 15.9 % — ABNORMAL HIGH (ref 11.5–15.5)
WBC Count: 6.4 10*3/uL (ref 4.0–10.5)
nRBC: 0 % (ref 0.0–0.2)

## 2021-07-09 LAB — CMP (CANCER CENTER ONLY)
ALT: 14 U/L (ref 0–44)
AST: 27 U/L (ref 15–41)
Albumin: 3.6 g/dL (ref 3.5–5.0)
Alkaline Phosphatase: 104 U/L (ref 38–126)
Anion gap: 6 (ref 5–15)
BUN: 33 mg/dL — ABNORMAL HIGH (ref 8–23)
CO2: 26 mmol/L (ref 22–32)
Calcium: 8.7 mg/dL — ABNORMAL LOW (ref 8.9–10.3)
Chloride: 108 mmol/L (ref 98–111)
Creatinine: 1.51 mg/dL — ABNORMAL HIGH (ref 0.44–1.00)
GFR, Estimated: 34 mL/min — ABNORMAL LOW (ref >60)
Glucose, Bld: 94 mg/dL (ref 70–99)
Potassium: 4.8 mmol/L (ref 3.5–5.1)
Sodium: 140 mmol/L (ref 135–145)
Total Bilirubin: 0.3 mg/dL (ref 0.3–1.2)
Total Protein: 7.1 g/dL (ref 6.5–8.1)

## 2021-07-09 MED ORDER — SODIUM CHLORIDE 0.9% FLUSH
10.0000 mL | Freq: Once | INTRAVENOUS | Status: AC
  Administered 2021-07-09: 10 mL

## 2021-07-09 MED ORDER — HEPARIN SOD (PORK) LOCK FLUSH 100 UNIT/ML IV SOLN
500.0000 [IU] | Freq: Once | INTRAVENOUS | Status: AC
  Administered 2021-07-09: 500 [IU]

## 2021-07-09 NOTE — Progress Notes (Signed)
?[facility]   ?[phone] [phone]   ?Clinic Follow up Note  ? ?Patient Care Team: ?[name], MD as PCP - General (Family Medicine) ?[name], [name], MD as PCP - Cardiology (Cardiology) ?[name], [name], MD as Consulting Physician (Cardiology) ?[name], MD as Consulting Physician (Obstetrics and Gynecology) ?Gardenia Phlegm, NP as Nurse Practitioner (Hematology and Oncology) ?[facility] ?[name], MD as Consulting Physician (Hematology) ?[name], [name], MD as Consulting Physician (Gastroenterology) ?Placke, Dawn, RN as Oncology Nurse Navigator ?[name], MD as Consulting Physician (General Surgery) ? ?Date of Service:  07/09/2021 ? ?CHIEF COMPLAINT: f/u of cholangiocarcinoma ? ?CURRENT THERAPY:  ?Surveillance, s/p Y90 05/02/21 ? ?ASSESSMENT & PLAN:  ?Donna May is a 81 y.o. female with  ? ?1. Intrahepatic cholangiocarcinoma, cT1N0M1, with peritoneal metastasis, MSS, IDH1 mutation (+) ?-Diagnosed in 09/2018; biopsy of her liver mass shows adenocarcinoma, most consistent with cholangiocarcinoma. 09/29/18 PET scan showed no evidence of distant metastasis. ?-she was found to have peritoneal metastasis to right diaphragm and surgery was aborted. ?-Her CT AP from 11/29/18 shows dominate liver mass stable to mild increase, mild nodularity of both adrenal glands which warrants being monitored.  No visible peritoneal metastasis on CT scan. ?-FO results showed MSI stable disease, IDH mutation positive. Consider IDH 1 inhibitor in the future.   ?-She began first line oxaliplatin (due to CKD) and gemcitabine every 2 weeks on 11/30/18. Oxali was dose reduced and eventually stopped after cycle 11. She received single agent gemcitabine with cycle 12. Cisplatin added with C13 on 05/17/19 but neuropathy worsened and stopped on 06/14/19. She received gemcitabine every 2 weeks starting from 06/28/19 C16. She took chemo break from 12/2019 -  03/2020 ?-PET from 03/14/20 showed concern for disease progression in liver, she restarted maintenance gemcitabine every 2 weeks from 04/03/20. ?-I added durvalumab to gemcitabine, due to recurrent disease on PET 09/2020. She tolerated well overall, except persistent neuropathy  ?-restaging PET on 01/02/21 showed further progression of cancer in liver. I stopped gemcitabine  ?-she met with IR on 01/30/21 to discuss possible Y90. She was felt to be a good candidate. ?-she was started on ivosidenib 500 mg daily on 02/25/21 while awaiting Y90. She is tolerating well, aside from possibly some increased neuropathy.  ?-CT AP 03/07/21 showed mild cancer progression in liver, no other new mets ?-she underwent left lobe Y90 on 05/02/21; she tolerated well overall. Dr. Pascal Lux does not plan for additional therapy given inability to cauterize left hepatic artery.  ?-due to worsening neuropathy, we held her ivosidenib since 06/06/21. Despite this, she continues to have worsened neuropathy, stable since coming off the ivosidenib. ?-plan for repeat scan in 07/2021; we will request PET scan and see if her insurance approves ?-per pt's preference of close follow up, we will see her every 3 weeks  ?  ?2. Grade 3 CIPN  ?-secondary to Oxaliplatin, eventually dc'd after cycle 11, and cisplatin, dc'd after 2 cycles.  Her diabetes may contribute to it also. ?-low efficacy of gabapentin and cymbalta, also tried lyrica briefly. She completed PT for balance. She uses gabapentin as needed, mainly for sleep. ?-Follow-up open with Dr. Mickeal Skinner, pt did not find helpful. Also seen by chiropractor. I also suggested acupuncture, she will think about it. I also mentioned that another patient of mine is undergoing light therapy and suggested she could look into it. ?-she reports aggravated tingling to her arms and legs on the ivosidenib. This has been stable off  the ivosidenib. ?  ?3. H/o right breast cancer, Genetics negative  ?-s/p mastectomy in 1992, per  patient did not require adjuvant therapy ?-previously followed by Dr. Jana Hakim  ?-VUS of gene APC; genetics otherwise normal ?-she has continued right arm lymphedema from surgery. ?  ?4. Comorbidities: DM, HTN, hiatal hernia, GERD, renal artery stenosis, CHF ?-Follow-up with PCP, nephrology, and Dr. Dwyane Dee, and cardiology ?-stable, well controlled ?  ?5. Goal of care discussion ?-We frequently discuss the goal of her treatment is palliative and that her cancer is not curable with chemo alone at this stage. The goal is to control her disease, prolong her life, and give her good quality of life. She understands ?-she is full code now  ?  ?6. CKD Stage III ?-Scr previously stable at 1.3 - 1.6 ?-worsened prior to Y90 procedure, reaching 2.25 the day of procedure. Today creatinine is 1.51 (07/09/21). ?  ?  ?PLAN: ?-PET scan in 2-3 weeks ?-lab, flush, and f/u in 3 weeks, pt prefers close f/u  ? ? ?No problem-specific Assessment & Plan notes found for this encounter. ? ? ?SUMMARY OF ONCOLOGIC HISTORY: ?Oncology History Overview Note  ?Cancer Staging ?Intrahepatic cholangiocarcinoma ([place]) ?Staging form: Intrahepatic Bile Duct, AJCC 8th Edition ?- Clinical stage from 09/23/2018: Stage IB (cT1b, cN0, cM0) - Signed by [name], MD on 10/06/2018 ?Histologic grade (G): G3 ?Histologic grading system: 3 grade system ? ?  ?Intrahepatic cholangiocarcinoma ([place])  ?09/07/2018 Imaging  ? CT Chest IMPRESSION: ?1. New, enhancing mass involving segment 4 of the liver and fundus of gallbladder is concerning for malignancy. This may represent either metastatic disease from breast cancer or neoplasm primary to the liver or hepatic biliary tree. Further evaluation with contrast enhanced CT of the abdomen and pelvis is recommended. ?2. No findings to suggest metastatic disease within the chest. ?3.  Aortic Atherosclerosis (ICD10-I70.0). ?4. Coronary artery calcifications. ?  ?09/13/2018 Pathology Results  ? Diagnosis ?Liver, needle/core biopsy ?-  ADENOCARCINOMA. ?Microscopic Comment ?Immunohistochemistry for CK7 is positive. CK20, TTF1, CDX-2, GATA-3, PAX 8, Qualitative ER, p63 and CK5/6 are negative. The provided clinical history of remote mammary carcinoma is noted. Based on the morphology and immunophenotype of the adenocarcinoma observed in this specimen, primary cholangiocarcinoma is favored. Clinical and radiologic correlation are  encouraged. Results reported to [employer] [facility] on 09/15/2018. Intradepartmental consultation (Dr. Vic Ripper). ?  ?09/13/2018 Initial Diagnosis  ? Cholangiocarcinoma ([place]) ?  ?09/23/2018 Procedure  ? Colonoscopy by Dr. Havery Moros 09/23/18  ?IMPRESSION ?- Two 3 to 4 mm polyps in the ascending colon, removed with a cold snare. Resected and ?retrieved. ?- Five 3 to 5 mm polyps in the transverse colon, removed with a cold snare. Resected and ?retrieved. ?- One 5 mm polyp at the splenic flexure, removed with a cold snare. Resected and retrieved. ?- Three 3 to 5 mm polyps in the sigmoid colon, removed with a cold snare. Resected and ?retrieved. ?- The examination was otherwise normal. ?Upper Endopscy by Dr. Havery Moros 09/23/18  ?IMPRESSION ?- Esophagogastric landmarks identified. ?- 2 cm hiatal hernia. ?- Normal esophagus otherwise. ?- A single gastric polyp. Resected and retrieved. ?- Mild gastritis. Biopsied. ?- Normal duodenal bulb and second portion of the duodenum. ?  ?09/23/2018 Pathology Results  ? Diagnosis 09/23/18 ?1. Surgical [P], duodenum ?- BENIGN SMALL BOWEL MUCOSA. ?- NO ACTIVE INFLAMMATION OR VILLOUS ATROPHY IDENTIFIED. ?2. Surgical [P], stomach, polyp ?- HYPERPLASTIC POLYP(S). ?- THERE IS NO EVIDENCE OF MALIGNANCY. ?3. Surgical [P], gastric antrum and gastric body ?- CHRONIC INACTIVE GASTRITIS. ?-  THERE IS NO EVIDENCE OF HELICOBACTER-PYLORI, DYSPLASIA, OR MALIGNANCY. ?- SEE COMMENT. ?4. Surgical [P], colon, sigmoid, splenic flexure, transverse and ascending, polyp (9) ?- TUBULAR ADENOMA(S). ?- SESSILE SERRATED POLYP  WITHOUT CYTOLOGIC DYSPLASIA. ?- HIGH GRADE DYSPLASIA IS NOT IDENTIFIED. ?5. Surgical [P], colon, sigmoid, polyp (2) ?- HYPERPLASTIC POLYP(S). ?- THERE IS NO EVIDENCE OF MALIGNANCY. ?  ?09/23/2018 Cancer Staging

## 2021-07-09 NOTE — Telephone Encounter (Signed)
Scheduled per 4/26 los, put labs/ MD visit on same day per pt request to come same day ?

## 2021-07-14 DIAGNOSIS — Z20828 Contact with and (suspected) exposure to other viral communicable diseases: Secondary | ICD-10-CM | POA: Diagnosis not present

## 2021-07-19 DIAGNOSIS — Z20822 Contact with and (suspected) exposure to covid-19: Secondary | ICD-10-CM | POA: Diagnosis not present

## 2021-07-21 DIAGNOSIS — Z20822 Contact with and (suspected) exposure to covid-19: Secondary | ICD-10-CM | POA: Diagnosis not present

## 2021-07-24 ENCOUNTER — Encounter (HOSPITAL_COMMUNITY)
Admission: RE | Admit: 2021-07-24 | Discharge: 2021-07-24 | Disposition: A | Discharge status: Home / Self Care | Payer: Medicare Other | Source: Ambulatory Visit | Attending: Hematology | Admitting: Hematology

## 2021-07-24 DIAGNOSIS — C221 Intrahepatic bile duct carcinoma: Secondary | ICD-10-CM | POA: Diagnosis not present

## 2021-07-24 DIAGNOSIS — I2510 Atherosclerotic heart disease of native coronary artery without angina pectoris: Secondary | ICD-10-CM | POA: Diagnosis not present

## 2021-07-24 DIAGNOSIS — K7689 Other specified diseases of liver: Secondary | ICD-10-CM | POA: Diagnosis not present

## 2021-07-24 DIAGNOSIS — D259 Leiomyoma of uterus, unspecified: Secondary | ICD-10-CM | POA: Diagnosis not present

## 2021-07-24 LAB — GLUCOSE, CAPILLARY: Glucose-Capillary: 97 mg/dL (ref 70–99)

## 2021-07-24 MED ORDER — FLUDEOXYGLUCOSE F - 18 (FDG) INJECTION
6.5000 | Freq: Once | INTRAVENOUS | Status: AC
  Administered 2021-07-24: 6.52 via INTRAVENOUS

## 2021-07-25 ENCOUNTER — Other Ambulatory Visit: Payer: Self-pay | Admitting: Interventional Radiology

## 2021-07-25 DIAGNOSIS — C221 Intrahepatic bile duct carcinoma: Secondary | ICD-10-CM

## 2021-07-26 DIAGNOSIS — L209 Atopic dermatitis, unspecified: Secondary | ICD-10-CM | POA: Diagnosis not present

## 2021-07-26 DIAGNOSIS — Z820 Family history of epilepsy and other diseases of the nervous system: Secondary | ICD-10-CM | POA: Diagnosis not present

## 2021-07-26 DIAGNOSIS — D649 Anemia, unspecified: Secondary | ICD-10-CM | POA: Diagnosis not present

## 2021-07-26 DIAGNOSIS — D8481 Immunodeficiency due to conditions classified elsewhere: Secondary | ICD-10-CM | POA: Diagnosis not present

## 2021-07-26 DIAGNOSIS — Z853 Personal history of malignant neoplasm of breast: Secondary | ICD-10-CM | POA: Diagnosis not present

## 2021-07-28 ENCOUNTER — Other Ambulatory Visit: Payer: Self-pay

## 2021-07-28 ENCOUNTER — Other Ambulatory Visit: Payer: Self-pay | Admitting: Family Medicine

## 2021-07-28 ENCOUNTER — Telehealth: Payer: Self-pay | Admitting: Family Medicine

## 2021-07-28 MED ORDER — GLUCOSE BLOOD VI STRP: ORAL_STRIP | 12 refills | Status: DC

## 2021-07-28 MED ORDER — SITAGLIPTIN PHOSPHATE 50 MG PO TABS: 50.0000 mg | ORAL_TABLET | Freq: Every day | ORAL | 1 refills | Status: AC

## 2021-07-28 NOTE — Telephone Encounter (Signed)
Per LOV notes, Rx sent for Januvia 50 mg and refill for test strips sent to requested pharmacy. ?

## 2021-07-28 NOTE — Telephone Encounter (Signed)
Pt is calling and need a new rx januvia 50 mg instead of 100 mg. Pt said md told her she would decrease januvia due to A1C is lower. Pt also needs refill on one touch verio test strip  ?[id number] [place]  - [address]:  [phone] ?Fax:  [phone] ?  ? ?

## 2021-07-28 NOTE — Progress Notes (Signed)
? ?Cardiology Clinic Note  ? ?Patient Name: Donna May ?Date of Encounter: 07/29/2021 ? ?Primary Care Provider:  [name], MD ?Primary Cardiologist:  Sanda Klein, MD ? ?Patient Profile  ?  ?Donna May 81 year old female presents the clinic today for  follow-up evaluation of her acute diastolic heart failure and hypertension. ? ?Past Medical History  ?  ?Past Medical History:  ?Diagnosis Date  ? Anemia   ? Anxiety   ? Arthritis   ? Breast cancer ([place]) 1992  ? right  ? Cataract   ? Bilateral eyes - surgery to remove  ? Chronic diastolic CHF (congestive heart failure) ([place])   ? Chronic kidney disease   ? CKD stage 3  ? Complication of anesthesia   ? "something they use make me itch for a couple of days."  ? Depression   ? Diabetes mellitus   ? type 2  ? Duodenitis 01/18/2002  ? Fainting spell   ? GERD (gastroesophageal reflux disease)   ? Heart murmur   ? never has caused any problems  ? Hiatal hernia 08/08/2008, 01/18/2002  ? History of pneumonia   ? x 2  ? Hyperlipidemia   ? Hypertension   ? Liver cancer ([place]) 08/2018  ? chemotherapy  ? Lymphedema 2017  ? Right arm  ? Peripheral neuropathy   ? ?Past Surgical History:  ?Procedure Laterality Date  ? AXILLARY SURGERY    ? cyst removal, right  ? COLONOSCOPY  09/23/2018  ? Dr. Havery Moros - polyps  ? EYE SURGERY Bilateral   ? cataracts to remove  ? IR 3D INDEPENDENT WKST  05/02/2021  ? IR ANGIOGRAM SELECTIVE EACH ADDITIONAL VESSEL  04/18/2021  ? IR ANGIOGRAM SELECTIVE EACH ADDITIONAL VESSEL  04/18/2021  ? IR ANGIOGRAM SELECTIVE EACH ADDITIONAL VESSEL  05/02/2021  ? IR ANGIOGRAM SELECTIVE EACH ADDITIONAL VESSEL  05/02/2021  ? IR ANGIOGRAM SELECTIVE EACH ADDITIONAL VESSEL  05/02/2021  ? IR ANGIOGRAM SELECTIVE EACH ADDITIONAL VESSEL  05/02/2021  ? IR ANGIOGRAM SELECTIVE EACH ADDITIONAL VESSEL  05/02/2021  ? IR ANGIOGRAM VISCERAL SELECTIVE  04/18/2021  ? IR ANGIOGRAM VISCERAL SELECTIVE  05/02/2021  ? IR EMBO TUMOR ORGAN ISCHEMIA INFARCT INC GUIDE ROADMAPPING  05/02/2021  ? IR  RADIOLOGIST EVAL & MGMT  01/30/2021  ? IR RADIOLOGIST EVAL & MGMT  05/22/2021  ? IR US GUIDE VASC ACCESS RIGHT  04/18/2021  ? IR US GUIDE VASC ACCESS RIGHT  05/02/2021  ? LAPAROSCOPY N/A 11/17/2018  ? Procedure: LAPAROSCOPY DIAGNOSTIC, INTRAOPERATIVE ULTRASOUND, PERITONEAL BIOPSIES;  Surgeon: [name], MD;  Location: [facility];  Service: General;  Laterality: N/A;  GENERAL AND EPIDURAL  ? LIVER BIOPSY  08/2018  ? Dr. Lindwood Coke  ? MASTECTOMY  1992  ? right, with flap  ? NM MYOCAR PERF WALL MOTION  06/11/2009  ? Protocol:[place], post stress EF58%, EKG negative for ischemia, low risk  ? PORTACATH PLACEMENT N/A 12/02/2018  ? Procedure: INSERTION PORT-A-CATH;  Surgeon: [name], MD;  Location: [facility];  Service: General;  Laterality: N/A;  ? RECONSTRUCTION BREAST W/ TRAM FLAP Right   ? TONSILLECTOMY  1958  ? TRANSTHORACIC ECHOCARDIOGRAM  12/24/2009  ? LVEF =>55%, normal study  ? UPPER GI ENDOSCOPY    ? ? ?Allergies ? ?No Known Allergies ? ?History of Present Illness  ?  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 ?  ?She was admitted to the hospital 01/18/2020 until 01/22/2020.  She was diagnosed with acute on chronic diastolic CHF, diabetes mellitus with complications of stage III CKD and hypertension.  She presented to the emergency department via EMS for evaluation of sudden onset of shortness of breath that had been present for approximately 6 hours.  When EMS arrived at her place of residence patient had a oxygen saturation of around 70%.  She was placed on nonrebreather but was unable to tolerate it.  She was placed on 3 L of oxygen which improved her O2 saturation to 90%.  In the emergency department her blood pressure was evaluated and read 230/150.   Respiratory rate is in the 40s and her heart rate was 100 bpm.  She was placed on noninvasive mechanical ventilation.  She denied chest pain, nausea, vomiting, diaphoresis and abdominal pain.  She denied urinary symptoms.  Her respiratory viral panel was negative her chest x-ray showed cardiomegaly with diffuse bilateral pulmonary interstitial edema.  EKG showed sinus rhythm with left atrial enlargement. ?  ?She received IV diuresis.  Her blood pressure remained high.  At the initiation of her home blood pressure medications blood pressure substantially improved.  Her hydralazine was increased to 100 mg 3 times daily, her metoprolol was transitioned to carvedilol.  Low-salt diet was discussed, volume and fluid restriction.  She admitted to dietary indiscretion eating fast food fairly frequently which was felt to be a major contributing factor to her heart failure exacerbation.  It was recommended that she have home health PT due to her ambulatory dysfunction and deconditioning. ?  ?She presented to the clinic 02/07/2020 for follow-up evaluation and stated she felt fairly well.  She stated she did have some neuropathy in her hands and her feet from cancer treatment.  She had been taking gabapentin which she did not like because it made her tired.  She had been trying her best to stay away from higher sodium food options.  We reviewed daily weights and I expressed that I would like her to call the office with a weight increase of 3 pounds overnight or 5 pounds in a week.  She expressed understanding.  I will gave her the salty 6 diet sheet, asked her to increase her physical activity as tolerated, and follow-up in 3 months. ?  ?She presented to the clinic 05/03/20 for follow-up evaluation and stated she felt well.  She continued to notice neuropathy pain in her hands and feet.  She had not been taking her gabapentin but was thinking about resuming it in the evenings.  She reported that she started cancer treatment  again at the beginning of January and had 3 infusions.  On her follow-up scan she reported spots were noted on her liver.  She would have a repeat scan in March or April.  She also reported that her oncologist was monitoring her hemoglobin.  Since she had been back on her infusions she  noted to be more anemic.  They were watching this closely.  Her blood pressures were well controlled at home.  She reported that  she did not take her amlodipine or carvedilol before her appointment.  We discussed medication compliance.  She reported that she was staying physically active and walking most days of the week.  I asked her to maintain a blood pressure log, continue her low-salt diet, and follow-up with Dr. Sallyanne Kuster in 6 months. ? ?She was  seen by Dr. Sallyanne Kuster 09/20/2020.  During that time she continued her chemotherapy for biliary cancer.  Her weight stabilized around 126-129 pounds.  She denied chest discomfort.  She denied orthopnea and PND.  She was able to do household chores with no shortness of breath.  She denied palpitations.  She did note focal neurological changes related to her chemotherapy and neuropathy in her fingers and feet.  It was recommended that her hemoglobin not drop below 8.  It was felt that she would not be a good candidate for ischemic evaluation due to her comorbidities.  Follow-up was planned for 12 months. ? ?She presented to the clinic 03/28/2021 for follow-up evaluation stated she continued to take cancer treatment  daily.  She reported that her tumor had been stable in size.  She was limited in her physical activity due to her neuropathy.  She also reported that her food lacks taste.  We reviewed the importance of using seasonings other than salt for her food.  She expressed understanding.  Her weight had remained fairly stable.  Her blood pressure was well controlled.  I gave her the salty 6 diet sheet, had her increase her physical activity as tolerated, refilled amlodipine, furosemide,  potassium, and check her lipids and LFTs.  We planned follow-up for 6 months. ? ?She presents to the clinic today for follow-up evaluation and states she has felt gradually more fatigued over the last several mo

## 2021-07-29 ENCOUNTER — Encounter: Payer: Self-pay | Admitting: General Practice

## 2021-07-29 ENCOUNTER — Ambulatory Visit (INDEPENDENT_AMBULATORY_CARE_PROVIDER_SITE_OTHER): Payer: Medicare Other | Admitting: General Practice

## 2021-07-29 VITALS — BP 136/58 | Ht 65.0 in | Wt 132.0 lb

## 2021-07-29 DIAGNOSIS — I5032 Chronic diastolic (congestive) heart failure: Secondary | ICD-10-CM | POA: Diagnosis not present

## 2021-07-29 DIAGNOSIS — I10 Essential (primary) hypertension: Secondary | ICD-10-CM

## 2021-07-29 DIAGNOSIS — C787 Secondary malignant neoplasm of liver and intrahepatic bile duct: Secondary | ICD-10-CM | POA: Diagnosis not present

## 2021-07-29 DIAGNOSIS — E7800 Pure hypercholesterolemia, unspecified: Secondary | ICD-10-CM

## 2021-07-29 DIAGNOSIS — C221 Intrahepatic bile duct carcinoma: Secondary | ICD-10-CM | POA: Diagnosis not present

## 2021-07-29 MED ORDER — EZETIMIBE 10 MG PO TABS: 10.0000 mg | ORAL_TABLET | Freq: Every day | ORAL | 3 refills | Status: AC

## 2021-07-29 NOTE — Patient Instructions (Signed)
Medication Instructions:  ?START EZETIMIBE '10MG'$  DAILY ? ?*If you need a refill on your cardiac medications before your next appointment, please call your pharmacy* ? ?Lab Work:    ?CBC TODAY    ? ?If you have labs (blood work) drawn today and your tests are completely normal, you will receive your results only by: ?MyChart Message (if you have MyChart) OR  A paper copy in the mail ?If you have any lab test that is abnormal or we need to change your treatment, we will call you to review the results. ? ?Testing/Procedures:  ?Echocardiogram - Your physician has requested that you have an echocardiogram. Echocardiography is a painless test that uses sound waves to create images of your heart. It provides your doctor with information about the size and shape of your heart and how well your heart?s chambers and valves are working. This procedure takes approximately one hour. There are no restrictions for this procedure.  ? ?Special Instructions ?PLEASE READ AND FOLLOW SALTY 6-ATTACHED-1,'800mg'$  daily ? ?PLEASE INCREASE PHYSICAL ACTIVITY AS TOLERATED  ? ?PLEASE READ AND FOLLOW INCREASED FIBER DIET-ATTACHED ? ?Follow-Up: ?Your next appointment:  1-2 month(s) In Person with Sanda Klein, MD [place], FNP-C :1 ? ?At [facility], you and your health needs are our priority.  As part of our continuing mission to provide you with exceptional heart care, we have created designated Provider Care Teams.  These Care Teams include your primary Cardiologist (physician) and Advanced Practice Providers (APPs -  Physician Assistants and Nurse Practitioners) who all work together to provide you with the care you need, when you need it. ? ? ?Important Information About Sugar ? ? ? ? ? ? ?        6 SALTY THINGS TO AVOID     1,'800MG'$  DAILY ? ? ? ? ?High-Fiber Eating Plan ?Fiber, also called dietary fiber, is a type of carbohydrate. It is found foods such as fruits, vegetables, whole grains, and beans. A high-fiber diet can have many  health benefits. Your health care provider may recommend a high-fiber diet to help: ?Prevent constipation. Fiber can make your bowel movements more regular. ?Lower your cholesterol. ?Relieve the following conditions: ?Inflammation of veins in the anus (hemorrhoids). ?Inflammation of specific areas of the digestive tract (uncomplicated diverticulosis). ?A problem of the large intestine, also called the colon, that sometimes causes pain and diarrhea (irritable bowel syndrome, or IBS). ?Prevent overeating as part of a weight-loss plan. ?Prevent heart disease, type 2 diabetes, and certain cancers. ?What are tips for following this plan? ?Reading food labels ? ?Check the nutrition facts label on food products for the amount of dietary fiber. Choose foods that have 5 grams of fiber or more per serving. ?The goals for recommended daily fiber intake include: ?Men (age 74 or younger): 34-38 g. ?Men (over age 15): 28-34 g. ?Women (age 30 or younger): 25-28 g. ?Women (over age 55): 22-25 g. ?Your daily fiber goal is _____________ g. ?Shopping ?Choose whole fruits and vegetables instead of processed forms, such as apple juice or applesauce. ?Choose a wide variety of high-fiber foods such as avocados, lentils, oats, and kidney beans. ?Read the nutrition facts label of the foods you choose. Be aware of foods with added fiber. These foods often have high sugar and sodium amounts per serving. ?Cooking ?Use whole-grain flour for baking and cooking. ?Cook with brown rice instead of white rice. ?Meal planning ?Start the day with a breakfast that is high in fiber, such as a cereal  that contains 5 g of fiber or more per serving. ?Eat breads and cereals that are made with whole-grain flour instead of refined flour or white flour. ?Eat brown rice, bulgur wheat, or millet instead of white rice. ?Use beans in place of meat in soups, salads, and pasta dishes. ?Be sure that half of the grains you eat each day are whole grains. ?General  information ?You can get the recommended daily intake of dietary fiber by: ?Eating a variety of fruits, vegetables, grains, nuts, and beans. ?Taking a fiber supplement if you are not able to take in enough fiber in your diet. It is better to get fiber through food than from a supplement. ?Gradually increase how much fiber you consume. If you increase your intake of dietary fiber too quickly, you may have bloating, cramping, or gas. ?Drink plenty of water to help you digest fiber. ?Choose high-fiber snacks, such as berries, raw vegetables, nuts, and popcorn. ?What foods should I eat? ?Fruits ?Berries. Pears. Apples. Oranges. Avocado. Prunes and raisins. Dried figs. ?Vegetables ?Sweet potatoes. Spinach. Kale. Artichokes. Cabbage. Broccoli. Cauliflower. Green peas. Carrots. Squash. ?Grains ?Whole-grain breads. Multigrain cereal. Oats and oatmeal. Brown rice. Barley. Bulgur wheat. [place]. Quinoa. Bran muffins. Popcorn. Rye wafer crackers. ?Meats and other proteins ?Navy beans, kidney beans, and pinto beans. Soybeans. Split peas. Lentils. Nuts and seeds. ?Dairy ?Fiber-fortified yogurt. ?Beverages ?Fiber-fortified soy milk. Fiber-fortified orange juice. ?Other foods ?Fiber bars. ?The items listed above may not be a complete list of recommended foods and beverages. Contact a dietitian for more information. ?What foods should I avoid? ?Fruits ?Fruit juice. Cooked, strained fruit. ?Vegetables ?Fried potatoes. Canned vegetables. Well-cooked vegetables. ?Grains ?White bread. Pasta made with refined flour. White rice. ?Meats and other proteins ?Fatty cuts of meat. Fried chicken or fried fish. ?Dairy ?Milk. Yogurt. Cream cheese. Sour cream. ?Fats and oils ?Butters. ?Beverages ?Soft drinks. ?Other foods ?Cakes and pastries. ?The items listed above may not be a complete list of foods and beverages to avoid. Talk with your dietitian about what choices are best for you. ?Summary ?Fiber is a type of carbohydrate. It is found in foods  such as fruits, vegetables, whole grains, and beans. ?A high-fiber diet has many benefits. It can help to prevent constipation, lower blood cholesterol, aid weight loss, and reduce your risk of heart disease, diabetes, and certain cancers. ?Increase your intake of fiber gradually. Increasing fiber too quickly may cause cramping, bloating, and gas. Drink plenty of water while you increase the amount of fiber you consume. ?The best sources of fiber include whole fruits and vegetables, whole grains, nuts, seeds, and beans. ?This information is not intended to replace advice given to you by your health care provider. Make sure you discuss any questions you have with your health care provider. ?Document Revised: 07/06/2019 Document Reviewed: 07/06/2019 ?Elsevier Patient Education ? [place]. ? ? ?

## 2021-07-31 ENCOUNTER — Telehealth: Payer: Self-pay

## 2021-07-31 ENCOUNTER — Other Ambulatory Visit: Payer: Self-pay

## 2021-07-31 ENCOUNTER — Other Ambulatory Visit (HOSPITAL_COMMUNITY): Payer: Self-pay

## 2021-07-31 ENCOUNTER — Encounter: Payer: Self-pay | Admitting: Hematology

## 2021-07-31 ENCOUNTER — Telehealth: Payer: Self-pay | Admitting: Pharmacist

## 2021-07-31 ENCOUNTER — Inpatient Hospital Stay: Payer: Medicare Other | Attending: Adult Health

## 2021-07-31 ENCOUNTER — Inpatient Hospital Stay (HOSPITAL_BASED_OUTPATIENT_CLINIC_OR_DEPARTMENT_OTHER): Payer: Medicare Other | Admitting: Hematology

## 2021-07-31 VITALS — BP 148/52 | HR 62 | Temp 98.2°F | Resp 18 | Ht 65.0 in | Wt 132.9 lb

## 2021-07-31 DIAGNOSIS — C221 Intrahepatic bile duct carcinoma: Secondary | ICD-10-CM

## 2021-07-31 DIAGNOSIS — E1122 Type 2 diabetes mellitus with diabetic chronic kidney disease: Secondary | ICD-10-CM | POA: Insufficient documentation

## 2021-07-31 DIAGNOSIS — Z95828 Presence of other vascular implants and grafts: Secondary | ICD-10-CM

## 2021-07-31 DIAGNOSIS — C786 Secondary malignant neoplasm of retroperitoneum and peritoneum: Secondary | ICD-10-CM | POA: Diagnosis not present

## 2021-07-31 DIAGNOSIS — I890 Lymphedema, not elsewhere classified: Secondary | ICD-10-CM | POA: Insufficient documentation

## 2021-07-31 DIAGNOSIS — Z79899 Other long term (current) drug therapy: Secondary | ICD-10-CM | POA: Insufficient documentation

## 2021-07-31 DIAGNOSIS — N183 Chronic kidney disease, stage 3 unspecified: Secondary | ICD-10-CM | POA: Diagnosis not present

## 2021-07-31 LAB — CMP (CANCER CENTER ONLY)
ALT: 13 U/L (ref 0–44)
AST: 31 U/L (ref 15–41)
Albumin: 3.4 g/dL — ABNORMAL LOW (ref 3.5–5.0)
Alkaline Phosphatase: 102 U/L (ref 38–126)
Anion gap: 6 (ref 5–15)
BUN: 26 mg/dL — ABNORMAL HIGH (ref 8–23)
CO2: 26 mmol/L (ref 22–32)
Calcium: 8.5 mg/dL — ABNORMAL LOW (ref 8.9–10.3)
Chloride: 106 mmol/L (ref 98–111)
Creatinine: 1.26 mg/dL — ABNORMAL HIGH (ref 0.44–1.00)
GFR, Estimated: 43 mL/min — ABNORMAL LOW (ref >60)
Glucose, Bld: 186 mg/dL — ABNORMAL HIGH (ref 70–99)
Potassium: 4.4 mmol/L (ref 3.5–5.1)
Sodium: 138 mmol/L (ref 135–145)
Total Bilirubin: 0.4 mg/dL (ref 0.3–1.2)
Total Protein: 7 g/dL (ref 6.5–8.1)

## 2021-07-31 LAB — CBC WITH DIFFERENTIAL (CANCER CENTER ONLY)
Abs Immature Granulocytes: 0.01 10*3/uL (ref 0.00–0.07)
Basophils Absolute: 0 10*3/uL (ref 0.0–0.1)
Basophils Relative: 1 %
Eosinophils Absolute: 0.2 10*3/uL (ref 0.0–0.5)
Eosinophils Relative: 3 %
HCT: 34.5 % — ABNORMAL LOW (ref 36.0–46.0)
Hemoglobin: 11.2 g/dL — ABNORMAL LOW (ref 12.0–15.0)
Immature Granulocytes: 0 %
Lymphocytes Relative: 9 %
Lymphs Abs: 0.6 10*3/uL — ABNORMAL LOW (ref 0.7–4.0)
MCH: 30 pg (ref 26.0–34.0)
MCHC: 32.5 g/dL (ref 30.0–36.0)
MCV: 92.5 fL (ref 80.0–100.0)
Monocytes Absolute: 0.5 10*3/uL (ref 0.1–1.0)
Monocytes Relative: 9 %
Neutro Abs: 4.8 10*3/uL (ref 1.7–7.7)
Neutrophils Relative %: 78 %
Platelet Count: 170 10*3/uL (ref 150–400)
RBC: 3.73 MIL/uL — ABNORMAL LOW (ref 3.87–5.11)
RDW: 15.3 % (ref 11.5–15.5)
WBC Count: 6 10*3/uL (ref 4.0–10.5)
nRBC: 0 % (ref 0.0–0.2)

## 2021-07-31 MED ORDER — SODIUM CHLORIDE 0.9% FLUSH
10.0000 mL | Freq: Once | INTRAVENOUS | Status: AC
  Administered 2021-07-31: 10 mL

## 2021-07-31 MED ORDER — REGORAFENIB 40 MG PO TABS
ORAL_TABLET | ORAL | 0 refills | Status: DC
  Filled 2021-07-31: qty 63, fill #0

## 2021-07-31 MED ORDER — HEPARIN SOD (PORK) LOCK FLUSH 100 UNIT/ML IV SOLN
500.0000 [IU] | Freq: Once | INTRAVENOUS | Status: AC
  Administered 2021-07-31: 500 [IU]

## 2021-07-31 NOTE — Telephone Encounter (Signed)
Oral Oncology Pharmacist Encounter  Received new prescription for Stivarga (regorafenib) for the treatment of metastatic intrahepatic cholangiocarcinoma, planned duration until disease progression or unacceptable drug toxicity.  CBC w/ Diff and CMP from 07/31/21 assessed, noted Scr 1.26 mg/dL (CrCl ~32.7 mL/min). No baseline dose adjustments required for renal function. Prescription dose and frequency assessed for appropriateness.  Current medication list in Epic reviewed, no relevant/significant DDIs with Stivarga identified.  Evaluated chart and no patient barriers to medication adherence noted.   Patient agreement for treatment documented in MD note on 07/31/21.  Prescription has been e-scribed to the [facility] for benefits analysis and approval.  Oral Oncology Clinic will continue to follow for insurance authorization, copayment issues, initial counseling and start date.  Leron Croak, PharmD, BCPS Hematology/Oncology Clinical Pharmacist [facility] [phone] 07/31/2021 9:44 AM

## 2021-07-31 NOTE — Telephone Encounter (Addendum)
Oral Oncology Pharmacist Encounter   Prior Authorization for Stivarga (regorafenib) has been denied.    Will start appeal process at this time through [facility].   ADDENDUM 08/01/2021 Urgent appeal faxed to [place] (fax [phone]).   Oral Oncology Clinic will continue to follow.    Leron Croak, PharmD, BCPS Hematology/Oncology Clinical Pharmacist [facility] [phone] 07/31/2021 3:25 PM

## 2021-07-31 NOTE — Telephone Encounter (Signed)
Oral Oncology Patient Advocate Encounter   Received notification from [place] that prior authorization for Donna May is required.   PA submitted on CoverMyMeds Key [id number] Status is pending   Oral Oncology Clinic will continue to follow.  [place] Patient [place] Phone [phone] Fax [phone] 07/31/2021 9:25 AM

## 2021-07-31 NOTE — Progress Notes (Signed)
[place]   [phone] [phone]   Clinic Follow up Note   Patient Care Team: [name], MD as PCP - General (Family Medicine) [name], [name], MD as PCP - Cardiology (Cardiology) [name], [name], MD as Consulting Physician (Cardiology) [name], MD as Consulting Physician (Obstetrics and Gynecology) [name], [name], NP as Nurse Practitioner (Hematology and Oncology) [facility] [name], MD as Consulting Physician (Hematology) [name], [name], MD as Consulting Physician (Gastroenterology) [name], RN as Oncology Nurse Navigator [name], MD as Consulting Physician (General Surgery)  Date of Service:  07/31/2021  CHIEF COMPLAINT: f/u of cholangiocarcinoma  CURRENT THERAPY:  Surveillance, s/p Y90 05/02/21  ASSESSMENT & PLAN:  Donna May is a 81 y.o. female with   1. Intrahepatic cholangiocarcinoma, cT1N0M1, with peritoneal metastasis, MSS, IDH1 mutation (+) -Diagnosed in 09/2018; biopsy of her liver mass shows adenocarcinoma, most consistent with cholangiocarcinoma. 09/29/18 PET scan showed no evidence of distant metastasis. -she was found to have peritoneal metastasis to right diaphragm and surgery was aborted. -Her CT AP from 11/29/18 shows dominate liver mass stable to mild increase, mild nodularity of both adrenal glands which warrants being monitored.  No visible peritoneal metastasis on CT scan. -FO results showed MSI stable disease, IDH mutation positive. Consider IDH 1 inhibitor in the future.   -She began first line oxaliplatin (due to CKD) and gemcitabine every 2 weeks on 11/30/18. Oxali was dose reduced and eventually stopped after cycle 11. She received single agent gemcitabine with cycle 12. Cisplatin added with C13 on 05/17/19 but neuropathy worsened and stopped on 06/14/19. She received gemcitabine every 2 weeks starting from 06/28/19 C16. She took chemo break from 12/2019 -  03/2020 -PET from 03/14/20 showed concern for disease progression in liver, she restarted maintenance gemcitabine every 2 weeks from 04/03/20. -I added durvalumab to gemcitabine, due to recurrent disease on PET 09/2020. She tolerated well overall, except persistent neuropathy  -restaging PET on 01/02/21 showed further progression of cancer in liver. I stopped gemcitabine  -she met with IR on 01/30/21 to discuss possible Y90. She was felt to be a good candidate. -she was started on ivosidenib 500 mg daily on 02/25/21 while awaiting Y90. She is tolerating well, aside from possibly some increased neuropathy.  -CT AP 03/07/21 showed mild cancer progression in liver, no other new mets -she underwent left lobe Y90 on 05/02/21; she tolerated well overall. Dr. Pascal Lux does not plan for additional therapy given inability to cauterize left hepatic artery.  -due to worsening neuropathy, we held her ivosidenib since 06/06/21. Despite this, she continues to have worsened neuropathy, stable since coming off the ivosidenib. -PET scan on 07/24/21 showed: interval enlargement of tracer-avid confluent mass involving both lobes of liver; several new sites tracer-avid disease within liver; no tracer-avid nodal or distant metastasis. I reviewed the results and images with her today. -we discussed treatment options. My recommendation is for oral Stivarga. We discussed indications and side effects of Stivarga. She agrees to try, and we will hope to start next week. Our pharmacist Wells Guiles will reach out to her. Will do dose ramping for first cycle  -she will see an APP or Dr. Alvy Bimler in 3 weeks, and I will see her in 5 weeks.  2. Goal of care discussion, DNR -We frequently discuss the goal of her treatment is palliative and that her cancer is not curable with chemo alone at this stage. The goal is to control her disease, prolong her life,  and give her good quality of life. She understands -I also discussed the option of palliative  home health care. Given she lives alone, I recommend this. Under palliative care, she will continue doctor visits and care under [place] here. -I recommend DNR/DNI, she agrees today (07/31/21).   3. Grade 3 CIPN  -secondary to Oxaliplatin, eventually dc'd after cycle 11, and cisplatin, dc'd after 2 cycles.  Her diabetes may contribute to it also. -low efficacy of gabapentin and cymbalta, also tried lyrica briefly. She completed PT for balance. She uses gabapentin as needed, mainly for sleep. -Follow-up open with Dr. Mickeal Skinner, pt did not find helpful. Also seen by chiropractor. I also suggested acupuncture, she will think about it. I also mentioned that another patient of mine is undergoing light therapy and suggested she could look into it. -she reports aggravated tingling to her arms and legs on the ivosidenib. This has been stable off the ivosidenib.   4. H/o right breast cancer, Genetics negative  -s/p mastectomy in 1992, per patient did not require adjuvant therapy -previously followed by Dr. Jana Hakim  -VUS of gene APC; genetics otherwise normal -she has continued right arm lymphedema from surgery.   5. Comorbidities: DM, HTN, hiatal hernia, GERD, renal artery stenosis, CHF -Follow-up with PCP, nephrology, and Dr. Dwyane Dee, and cardiology -stable, well controlled   6. CKD Stage III -Scr previously stable at 1.3 - 1.6 -worsened prior to Y90 procedure, reaching 2.25 the day of procedure.  -improved to 1.26 today (07/31/21)     PLAN: -plan to start [place] when she receives it. -lab, flush, and f/u on 6/8 or 6/9 and the week of 6/19 -I called her daughter Theadora Rama, no answer, I left a detailed message to update her the above, especially recent cancer progression, DNR and treatment.  I encouraged her to call back if she has further questions.   No problem-specific Assessment & Plan notes found for this encounter.   SUMMARY OF ONCOLOGIC HISTORY: Oncology History Overview Note  Cancer  Staging Intrahepatic cholangiocarcinoma ([place]) Staging form: Intrahepatic Bile Duct, AJCC 8th Edition - Clinical stage from 09/23/2018: Stage IB (cT1b, cN0, cM0) - Signed by [name], MD on 10/06/2018 Histologic grade (G): G3 Histologic grading system: 3 grade system    Intrahepatic cholangiocarcinoma ([place])  09/07/2018 Imaging   CT Chest IMPRESSION: 1. New, enhancing mass involving segment 4 of the liver and fundus of gallbladder is concerning for malignancy. This may represent either metastatic disease from breast cancer or neoplasm primary to the liver or hepatic biliary tree. Further evaluation with contrast enhanced CT of the abdomen and pelvis is recommended. 2. No findings to suggest metastatic disease within the chest. 3.  Aortic Atherosclerosis (ICD10-I70.0). 4. Coronary artery calcifications.   09/13/2018 Pathology Results   Diagnosis Liver, needle/core biopsy - ADENOCARCINOMA. Microscopic Comment Immunohistochemistry for CK7 is positive. CK20, TTF1, CDX-2, GATA-3, PAX 8, Qualitative ER, p63 and CK5/6 are negative. The provided clinical history of remote mammary carcinoma is noted. Based on the morphology and immunophenotype of the adenocarcinoma observed in this specimen, primary cholangiocarcinoma is favored. Clinical and radiologic correlation are  encouraged. Results reported to [employer] on 09/15/2018. Intradepartmental consultation (Dr. Vic Ripper).   09/13/2018 Initial Diagnosis   Cholangiocarcinoma ([place])   09/23/2018 Procedure   Colonoscopy by Dr. Havery Moros 09/23/18  IMPRESSION - Two 3 to 4 mm polyps in the ascending colon, removed with a cold snare. Resected and retrieved. - Five 3 to 5 mm polyps in the transverse colon, removed with a  cold snare. Resected and retrieved. - One 5 mm polyp at the splenic flexure, removed with a cold snare. Resected and retrieved. - Three 3 to 5 mm polyps in the sigmoid colon, removed with a cold snare. Resected and retrieved. - The  examination was otherwise normal. Upper Endopscy by Dr. Havery Moros 09/23/18  IMPRESSION - Esophagogastric landmarks identified. - 2 cm hiatal hernia. - Normal esophagus otherwise. - A single gastric polyp. Resected and retrieved. - Mild gastritis. Biopsied. - Normal duodenal bulb and second portion of the duodenum.   09/23/2018 Pathology Results   Diagnosis 09/23/18 1. Surgical [P], duodenum - BENIGN SMALL BOWEL MUCOSA. - NO ACTIVE INFLAMMATION OR VILLOUS ATROPHY IDENTIFIED. 2. Surgical [P], stomach, polyp - HYPERPLASTIC POLYP(S). - THERE IS NO EVIDENCE OF MALIGNANCY. 3. Surgical [P], gastric antrum and gastric body - CHRONIC INACTIVE GASTRITIS. - THERE IS NO EVIDENCE OF HELICOBACTER-PYLORI, DYSPLASIA, OR MALIGNANCY. - SEE COMMENT. 4. Surgical [P], colon, sigmoid, splenic flexure, transverse and ascending, polyp (9) - TUBULAR ADENOMA(S). - SESSILE SERRATED POLYP WITHOUT CYTOLOGIC DYSPLASIA. - HIGH GRADE DYSPLASIA IS NOT IDENTIFIED. 5. Surgical [P], colon, sigmoid, polyp (2) - HYPERPLASTIC POLYP(S). - THERE IS NO EVIDENCE OF MALIGNANCY.   09/23/2018 Cancer Staging   Staging form: Intrahepatic Bile Duct, AJCC 8th Edition - Clinical stage from 09/23/2018: Stage IB (cT1b, cN0, cM0) - Signed by [name], MD on 10/06/2018    09/29/2018 PET scan   PET 09/29/18 IMPRESSION: 1. Hypermetabolic mass in the RIGHT hepatic lobe consistent with biopsy proven adenocarcinoma. No additional liver metastasis. 2. No evidence of local breast cancer recurrence in the RIGHT breast or RIGHT axilla. 3. Mild bilateral hypermetabolic adrenal glands is favored benign hyperplasia. 4. No evidence of additional metastatic disease on skull base to thigh FDG PET scan.   11/06/2018 Genetic Testing   Negative genetic testing on the [facility] Common Hereditary Cancers panel. A variant of uncertain significance was identified in one of her APC genes, called c.1243G>A (p.Ala415Thr).  The Common Hereditary Cancers  Panel offered by [facility] includes sequencing and/or deletion duplication testing of the following 48 genes: APC, ATM, AXIN2, BARD1, BMPR1A, BRCA1, BRCA2, BRIP1, CDH1, CDK4, CDKN2A (p14ARF), CDKN2A (p16INK4a), CHEK2, CTNNA1, DICER1, EPCAM (Deletion/duplication testing only), GREM1 (promoter region deletion/duplication testing only), KIT, MEN1, MLH1, MSH2, MSH3, MSH6, MUTYH, NBN, NF1, NHTL1, PALB2, PDGFRA, PMS2, POLD1, POLE, PTEN, RAD50, RAD51C, RAD51D, RNF43, SDHB, SDHC, SDHD, SMAD4, SMARCA4. STK11, TP53, TSC1, TSC2, and VHL.  The following genes were evaluated for sequence changes only: SDHA and HOXB13 c.251G>A variant only.    11/17/2018 Pathology Results   Diagnosis 11/17/18 1. Soft tissue, biopsy, Diaphragmatic nodules - METASTATIC ADENOCARCINOMA, CONSISTENT WITH PATIENT'S CLINICAL HISTORY OF CHOLANGIOCARCINOMA. SEE NOTE 2. Liver, biopsy, Left - LIVER PARENCHYMA WITH A BENIGN FIBROTIC NODULE - NO EVIDENCE OF MALIGNANCY 3. Stomach, biopsy - BENIGN PAPILLARY MESOTHELIAL HYPERPLASIA - NO EVIDENCE OF MALIGNANCY   11/29/2018 Imaging   CT CAP WO Contrast  IMPRESSION: 1. Dominant liver mass appears grossly stable from 09/29/2018. Additional liver lesions are too small to characterize but were not shown to be hypermetabolic on PET. 2. Mild nodularity of both adrenal glands with associated hypermetabolism on 09/29/2018. Continued attention on follow-up exams is warranted. 3. Small right lower lobe nodules, stable from 09/07/2018. Again, attention on follow-up is recommended. 4. Trace bilateral pleural fluid. 5. Aortic atherosclerosis (ICD10-170.0). Coronary artery calcification. 6. Enlarged pulmonic trunk, indicative of pulmonary arterial hypertension.     11/30/2018 - 06/14/2019 Chemotherapy   First line chemo Oxaliplatin and gemcitabine q2weeks  starting 11/30/18. Stopped Oxaliplatin on 05/03/19 due to worsening neuropathy. Added Cisplatin with C13 on 05/17/19 and stopped on C15 06/14/19 due to  neuropathy. Reduced to maintenance single agent Gemcitabine on 06/28/19.    01/20/2019 Imaging   CT CAP IMPRESSION: restaging  1. Mild interval increase in size of dominant liver mass involving segment 4 and segment 5. 2. No significant or progressive adrenal nodularity identified to suggest metastatic disease. 3. Unchanged appearance of small right lower lobe and lingular lung nodules, nonspecific.   03/21/2019 PET scan   IMPRESSION: 1. Large right hepatic mass with SUV uptake near background hepatic activity, dramatic response to therapy, also with decrease in size when compared to the prior study. 2. Signs of prior right mastectomy and axillary dissection as before. 3. Left adrenal activity in no longer above the level of activity seen in the contralateral, right adrenal gland or in the liver. Attention on follow-up. 4. No new signs of disease.   06/27/2019 PET scan   IMPRESSION: 1. Further decrease in size of a dominant hepatic mass which is non FDG avid. 2. No evidence of metastatic disease. 3. No evidence of left adrenal hypermetabolism or mass. 4. Incidental findings, including: Uterine fibroids. Coronary artery atherosclerosis. Aortic Atherosclerosis (ICD10-I70.0). Pulmonary artery enlargement suggests pulmonary arterial hypertension.   06/28/2019 -  Chemotherapy   Maintenance single agent Gemcitabine q2weeks starting on 06/28/19 with C16.  ------On chemo break since 12/27/19.  ------She restarted Single Agent Gemcitabine q2weeks on 04/03/20 due to mild disease progression in liver on 02/2020 PET    09/15/2019 PET scan   IMPRESSION: 1. In the region of regional tumor at the junction of the right and left hepatic lobe, there is continued hypodensity and overall activity slightly less than that of the surrounding normal liver, indicating effective response to therapy. No current worrisome hypermetabolic lesion is identified in the liver or elsewhere. 2. Chronic asymmetric  edema in the subcutaneous tissues of the right upper extremity, nonspecific. The patient has had a prior right mastectomy and right axillary dissection. 3. Other imaging findings of potential clinical significance: Aortic Atherosclerosis (ICD10-I70.0). Coronary atherosclerosis. Mild cardiomegaly. Small right and trace left pleural effusions. Subcutaneous and mild mesenteric edema. Suspected uterine fibroids.   12/18/2019 PET scan   IMPRESSION: 1. No significant abnormal activity to suggest active/recurrent malignancy. 2. Lingual tonsillar activity is mildly increased from prior but probably incidental/physiologic, attention on follow up suggested. 3. Other imaging findings of potential clinical significance: Third spacing of fluid with trace ascites, mesenteric edema, subcutaneous edema, and possibly subtle pulmonary edema. Mild cardiomegaly. Aortic Atherosclerosis (ICD10-I70.0). Coronary atherosclerosis. Right ovarian dermoid. Calcified uterine fibroids. Renal cysts. Right glenohumeral arthropathy. Mild to moderate scoliosis. Suspected pulmonary arterial hypertension.   03/14/2020 PET scan   IMPRESSION: 1. New small focus of hypermetabolism in segment 4A of the liver worrisome for new metastatic focus. 2. No other findings for metastatic disease involving the neck, chest, abdomen or pelvis.     06/11/2020 PET scan   IMPRESSION: 1. No suspicious hypermetabolic activity within the neck, chest, abdomen or pelvis. 2. The treated dominant peripheral cholangiocarcinoma appears slightly enlarged compared with recent prior studies, and there is a questionable new lesion more superiorly in the left lobe. Although without associated hypermetabolic activity, these lesions are suspicious and would be better assessed with abdominal MRI without and with contrast.   09/17/2020 PET scan   IMPRESSION: 1. Recurrent hypermetabolic activity around the periphery of the dominant hepatic mass consistent  with local recurrence. This mass  appears slightly larger. Possible small metastasis in the dome of the left hepatic lobe is unchanged in size, without hypermetabolic activity. 2. No distant metastases identified. 3. Stable incidental findings as detailed above.   10/16/2020 -  Chemotherapy   Patient is on Treatment Plan : BLADDER Durvalumab q28d      01/02/2021 PET scan   IMPRESSION: 1. Further enlargement of hypermetabolic hepatic mass superior to the cholecystectomy bed consistent with progressive local tumor recurrence. Satellite lesion more superiorly in the anterior dome of the liver has also enlarged, without definite hypermetabolic activity. 2. No distant metastases identified. 3. Stable incidental findings including aortic atherosclerosis, hepatic and renal cysts and uterine fibroids.   07/24/2021 PET scan   IMPRESSION: 1. Interval enlargement of tracer avid confluent mass involving both lobes of liver centered around segment 4. Several new sites of tracer avid disease identified within the liver. 2. No signs of tracer avid nodal metastasis or distant metastatic disease. 3.  Aortic Atherosclerosis (ICD10-I70.0).      INTERVAL HISTORY:  Donna May is here for a follow up of cholangiocarcinoma. She was last seen by me on 07/09/21. She presents to the clinic alone. She reports she is stable overall, no new concerns.   All other systems were reviewed with the patient and are negative.  MEDICAL HISTORY:  Past Medical History:  Diagnosis Date   Anemia    Anxiety    Arthritis    Breast cancer (HCC) 1992   right   Cataract    Bilateral eyes - surgery to remove   Chronic diastolic CHF (congestive heart failure) (HCC)    Chronic kidney disease    CKD stage 3   Complication of anesthesia    "something they use make me itch for a couple of days."   Depression    Diabetes mellitus    type 2   Duodenitis 01/18/2002   Fainting spell    GERD (gastroesophageal reflux  disease)    Heart murmur    never has caused any problems   Hiatal hernia 08/08/2008, 01/18/2002   History of pneumonia    x 2   Hyperlipidemia    Hypertension    Liver cancer (HCC) 08/2018   chemotherapy   Lymphedema 2017   Right arm   Peripheral neuropathy     SURGICAL HISTORY: Past Surgical History:  Procedure Laterality Date   AXILLARY SURGERY     cyst removal, right   COLONOSCOPY  09/23/2018   Dr. Adela Lank - polyps   EYE SURGERY Bilateral    cataracts to remove   IR 3D INDEPENDENT WKST  05/02/2021   IR ANGIOGRAM SELECTIVE EACH ADDITIONAL VESSEL  04/18/2021   IR ANGIOGRAM SELECTIVE EACH ADDITIONAL VESSEL  04/18/2021   IR ANGIOGRAM SELECTIVE EACH ADDITIONAL VESSEL  05/02/2021   IR ANGIOGRAM SELECTIVE EACH ADDITIONAL VESSEL  05/02/2021   IR ANGIOGRAM SELECTIVE EACH ADDITIONAL VESSEL  05/02/2021   IR ANGIOGRAM SELECTIVE EACH ADDITIONAL VESSEL  05/02/2021   IR ANGIOGRAM SELECTIVE EACH ADDITIONAL VESSEL  05/02/2021   IR ANGIOGRAM VISCERAL SELECTIVE  04/18/2021   IR ANGIOGRAM VISCERAL SELECTIVE  05/02/2021   IR EMBO TUMOR ORGAN ISCHEMIA INFARCT INC GUIDE ROADMAPPING  05/02/2021   IR RADIOLOGIST EVAL & MGMT  01/30/2021   IR RADIOLOGIST EVAL & MGMT  05/22/2021   IR US GUIDE VASC ACCESS RIGHT  04/18/2021   IR US GUIDE VASC ACCESS RIGHT  05/02/2021   LAPAROSCOPY N/A 11/17/2018   Procedure: LAPAROSCOPY DIAGNOSTIC, INTRAOPERATIVE ULTRASOUND, PERITONEAL BIOPSIES;  Surgeon: [name], MD;  Location: [place];  Service: General;  Laterality: N/A;  GENERAL AND EPIDURAL   LIVER BIOPSY  08/2018   Dr. Lindwood Coke   MASTECTOMY  1992   right, with flap   NM [place]  06/11/2009   Protocol:[place], post stress EF58%, EKG negative for ischemia, low risk   PORTACATH PLACEMENT N/A 12/02/2018   Procedure: INSERTION PORT-A-CATH;  Surgeon: [name], MD;  Location: [place];  Service: General;  Laterality: N/A;   RECONSTRUCTION BREAST W/ TRAM FLAP Right    TONSILLECTOMY  1958   TRANSTHORACIC  ECHOCARDIOGRAM  12/24/2009   LVEF =>55%, normal study   UPPER GI ENDOSCOPY      I have reviewed the social history and family history with the patient and they are unchanged from previous note.  ALLERGIES:  has No Known Allergies.  MEDICATIONS:  Current Outpatient Medications  Medication Sig Dispense Refill   regorafenib (STIVARGA) 40 MG tablet Take 2 tabs daily for first week, then increase to 3 tabs on second week and 4 tabs daily for third week, then off for one week. Take with low fat meal. Caution: Chemotherapy. 63 tablet 0   amLODipine (NORVASC) 10 MG tablet Take 1 tablet (10 mg total) by mouth daily. 90 tablet 1   aspirin 81 MG tablet Take 81 mg by mouth daily.      Baclofen 5 MG TABS TAKE 1/2 TABLET BY MOUTH ONCE DAILY AS NEEDED 42 tablet 1   blood glucose meter kit and supplies KIT Dispense based on patient and insurance preference. Use up to four times daily as directed. 1 each 0   carvedilol (COREG) 25 MG tablet Take 1 tablet (25 mg total) by mouth 2 (two) times daily with a meal. 180 tablet 3   ezetimibe (ZETIA) 10 MG tablet Take 1 tablet (10 mg total) by mouth daily. 30 tablet 3   furosemide (LASIX) 40 MG tablet Take 1 tablet (40 mg total) by mouth daily. 90 tablet 1   gabapentin (NEURONTIN) 100 MG capsule Take 1 capsule (100 mg total) by mouth every 8 (eight) hours as needed. 180 capsule 2   glucose blood (ONETOUCH VERIO) test strip Use up to 3 times daily to check blood sugar. 100 strip 3   glucose blood test strip 1 each by Other route 2 (two) times daily. Use Onetouch verio test strips as instructed to check blood sugar twice daily. 100 each 2   hydrALAZINE (APRESOLINE) 100 MG tablet TAKE THREE TIMES DAILY-AM, MIDDLE OF THE DAY AND IN THE EVENING. 270 tablet 3   lansoprazole (PREVACID) 15 MG capsule Take 1 capsule (15 mg total) by mouth daily at 12 noon. Take 1 capsule by mouth once or twice a day as needed 90 capsule 3   lidocaine-prilocaine (EMLA) cream Apply 1 application  topically as needed. 30 g 1   loratadine (CLARITIN) 10 MG tablet Take 10 mg by mouth daily as needed for allergies.     Omega 3 1000 MG CAPS Take 2,000 mg by mouth daily.      oxyCODONE (ROXICODONE) 5 MG immediate release tablet Take 1 tablet (5 mg total) by mouth every 4 (four) hours as needed for severe pain. 20 tablet 0   polyethylene glycol (MIRALAX / GLYCOLAX) 17 g packet Take 17 g by mouth daily as needed. 14 each 0   potassium chloride (KLOR-CON M10) 10 MEQ tablet Take 1 tablet (10 mEq total) by mouth daily. 90 tablet 1  prochlorperazine (COMPAZINE) 10 MG tablet Take 1 tablet (10 mg total) by mouth every 6 (six) hours as needed for nausea or vomiting. 30 tablet 3   sitaGLIPtin (JANUVIA) 50 MG tablet Take 1 tablet (50 mg total) by mouth daily. 90 tablet 1   valsartan (DIOVAN) 320 MG tablet Take 1 tablet (320 mg total) by mouth daily. 90 tablet 3   Current Facility-Administered Medications  Medication Dose Route Frequency Provider Last Rate Last Admin   triamcinolone acetonide (KENALOG) 10 MG/ML injection 10 mg  10 mg Other Once Harriet Masson, DPM        PHYSICAL EXAMINATION: ECOG PERFORMANCE STATUS: 1 - Symptomatic but completely ambulatory  Vitals:   07/31/21 0832  BP: (!) 148/52  Pulse: 62  Resp: 18  Temp: 98.2 F (36.8 C)  SpO2: 100%   Wt Readings from Last 3 Encounters:  07/31/21 132 lb 14.4 oz (60.3 kg)  07/29/21 132 lb (59.9 kg)  07/09/21 131 lb (59.4 kg)     GENERAL:alert, no distress and comfortable SKIN: skin color normal, no rashes or significant lesions EYES: normal, Conjunctiva are pink and non-injected, sclera clear  NEURO: alert & oriented x 3 with fluent speech  LABORATORY DATA:  I have reviewed the data as listed    Latest Ref Rng & Units 07/31/2021    7:51 AM 07/29/2021    2:10 PM 07/09/2021    8:16 AM  CBC  WBC 4.0 - 10.5 K/uL 6.0   7.0   6.4    Hemoglobin 12.0 - 15.0 g/dL 11.2   11.3   10.9    Hematocrit 36.0 - 46.0 % 34.5   34.1   33.3     Platelets 150 - 400 K/uL 170   174   170          Latest Ref Rng & Units 07/31/2021    7:51 AM 07/09/2021    8:16 AM 06/11/2021    9:21 AM  CMP  Glucose 70 - 99 mg/dL 186   94   82    BUN 8 - 23 mg/dL 26   33   40    Creatinine 0.44 - 1.00 mg/dL 1.26   1.51   1.69    Sodium 135 - 145 mmol/L 138   140   139    Potassium 3.5 - 5.1 mmol/L 4.4   4.8   4.3    Chloride 98 - 111 mmol/L 106   108   105    CO2 22 - 32 mmol/L $RemoveB'26   26   29    'bpkfBpfW$ Calcium 8.9 - 10.3 mg/dL 8.5   8.7   9.1    Total Protein 6.5 - 8.1 g/dL 7.0   7.1   7.2    Total Bilirubin 0.3 - 1.2 mg/dL 0.4   0.3   0.4    Alkaline Phos 38 - 126 U/L 102   104   87    AST 15 - 41 U/L $Remo'31   27   21    'lFvBX$ ALT 0 - 44 U/L $Remo'13   14   10        'lzKpX$ RADIOGRAPHIC STUDIES: I have personally reviewed the radiological images as listed and agreed with the findings in the report. No results found.    Orders Placed This Encounter  Procedures   DNR (Do Not Resuscitate)    Order Specific Question:   In the event of cardiac or respiratory ARREST    Answer:   Do  not call a "code blue"    Order Specific Question:   In the event of cardiac or respiratory ARREST    Answer:   Do not perform Intubation, CPR, defibrillation or ACLS    Order Specific Question:   In the event of cardiac or respiratory ARREST    Answer:   Use medication by any route, position, wound care, and other measures to relive pain and suffering. May use oxygen, suction and manual treatment of airway obstruction as needed for comfort.   All questions were answered. The patient knows to call the clinic with any problems, questions or concerns. No barriers to learning was detected. The total time spent in the appointment was 45 minutes.     [name], MD 07/31/2021   I, Wilburn Mylar, am acting as scribe for [name], MD.   I have reviewed the above documentation for accuracy and completeness, and I agree with the above.

## 2021-08-01 ENCOUNTER — Telehealth: Payer: Self-pay | Admitting: Hematology

## 2021-08-01 ENCOUNTER — Other Ambulatory Visit (HOSPITAL_COMMUNITY): Payer: Self-pay

## 2021-08-01 NOTE — Telephone Encounter (Signed)
Scheduled follow-up appointments per 5/18 los. Patient is aware.

## 2021-08-02 ENCOUNTER — Other Ambulatory Visit: Payer: Self-pay | Admitting: General Practice

## 2021-08-04 ENCOUNTER — Encounter: Payer: Self-pay | Admitting: Oncology

## 2021-08-04 ENCOUNTER — Telehealth: Payer: Self-pay | Admitting: Pharmacy Technician

## 2021-08-04 ENCOUNTER — Other Ambulatory Visit (HOSPITAL_COMMUNITY): Payer: Self-pay

## 2021-08-04 ENCOUNTER — Encounter: Payer: Self-pay | Admitting: Hematology

## 2021-08-04 MED ORDER — REGORAFENIB 40 MG PO TABS: ORAL_TABLET | ORAL | 0 refills | Status: DC

## 2021-08-04 NOTE — Telephone Encounter (Addendum)
Oral Oncology Pharmacist Encounter  Appeal for [place] (regorafenib) has been overturned and coverage is approved.   Effective dates: 07/04/21 through 08/03/22  Patient's insurance requires prescription to be filled through [place]. Prescription redirected for dispensing.   Oral Oncology Clinic will continue to follow.   Leron Croak, PharmD, BCPS Hematology/Oncology Clinical Pharmacist [facility] [phone] 08/04/2021 10:22 AM

## 2021-08-04 NOTE — Telephone Encounter (Signed)
.  Oral Oncology Patient Advocate Encounter   Was successful in securing patient a [phone] grant from [employer] to provide copayment coverage for [place].  This will keep the out of pocket expense at $0.      The billing information is as follows and has been shared with [facility].   Member ID: [id number] Group ID: [id number] RxBin: [id number] PCN: [id number] Dates of Eligibility: 02/25/2021 through 02/25/2022  Fund name:  Cholangiocarcinoma.   [name], CPhT-Adv Pharmacy Patient Advocate Specialist [place] [facility] Patient Advocate Team Direct Number: [phone]  Fax: [phone]

## 2021-08-04 NOTE — Telephone Encounter (Signed)
Oral Oncology Patient Advocate Encounter  Prior Authorization for Donna May has been approved.    Effective dates: 07/04/2021 through 08/03/2022  Patients co-pay is $4,645.47.    [name], CPhT-Adv Pharmacy Patient Advocate Specialist [facility] Patient Advocate Team Direct Number: [phone]  Fax: [phone]

## 2021-08-06 NOTE — Telephone Encounter (Signed)
Oral Chemotherapy Pharmacist Encounter  I spoke with patient for overview of: Stivarga (regorafenib) for the treatment of metastatic intrahepatic cholangiocarcinoma, planned duration until disease progression or unacceptable drug toxicity.  Counseled patient on administration, dosing, side effects, monitoring, drug-food interactions, safe handling, storage, and disposal.  Patient's Stivarga dose will be initiated on a dose up-titration schedule.  For week 1: Patient will take Stivarga '40mg'$  tablets, 2 tablets ('80mg'$ ) by mouth once daily with a glass of water and a low fat meal of <600 calories, and <30% fat content.  If tolerated, for week 2: Patient will take Stivarga '40mg'$  tablets, 3 tablets ('120mg'$ ) by mouth once daily with a glass of water and a low fat meal of <600 calories, and <30% fat content.  If tolerated, for week 3: Patient will take Stivarga '40mg'$  tablets, 4 tablets ('160mg'$ ) by mouth once daily with a glass of water and a low fat meal of <600 calories, and <30% fat content.  Patient will be off Stivarga for week 4.  Dosing for subsequent cycles (3 weeks on, 1 week off, repeated every 4 weeks) will be determined based on toleration of cycle 1.  Patient knows to avoid grapefruit and grapefruit juice while on therapy with Stivarga.  Stivarga start date: 08/14/21  Adverse effects include but are not limited to: hand-foot syndrome, diarrhea, nausea, abdominal pain, musculoskeletal pain, fatigue, hypertension, delayed wound healing, decreased blood counts, and hepatotoxicity.    Patient has anti-emetic on hand and knows to take it if nausea develops.   Patient will obtain anti diarrheal and alert the office of 4 or more loose stools above baseline.  Reviewed with patient importance of keeping a medication schedule and plan for any missed doses. No barriers to medication adherence identified.  Stivarga should be discontinued 2 weeks prior to any planned surgery and resumed postsurgery  based on clinical judgement of wound healing.  Medication reconciliation performed and medication/allergy list updated.  Insurance authorization for Marylyn Ishihara has been obtained. Patient required to fill through [facility]. This will be delivered to patient's home 08/13/21.  All questions answered.  Ms. Alvi voiced understanding and appreciation.   Medication education handout and medication calendar placed in mail for patient. Patient knows to call the office with questions or concerns. Oral Chemotherapy Clinic phone number provided to patient.   Leron Croak, PharmD, BCPS Hematology/Oncology Clinical Pharmacist [facility] [phone] 08/06/2021 1:42 PM

## 2021-08-07 LAB — CBC

## 2021-08-08 ENCOUNTER — Other Ambulatory Visit: Payer: Self-pay

## 2021-08-08 ENCOUNTER — Encounter: Payer: Self-pay | Admitting: *Deleted

## 2021-08-08 ENCOUNTER — Encounter: Payer: Self-pay | Admitting: Oncology

## 2021-08-08 ENCOUNTER — Encounter: Payer: Self-pay | Admitting: Hematology

## 2021-08-08 ENCOUNTER — Ambulatory Visit
Admission: RE | Admit: 2021-08-08 | Discharge: 2021-08-08 | Disposition: A | Discharge status: Home / Self Care | Payer: Medicare Other | Source: Ambulatory Visit | Attending: Interventional Radiology | Admitting: Interventional Radiology

## 2021-08-08 DIAGNOSIS — C221 Intrahepatic bile duct carcinoma: Secondary | ICD-10-CM

## 2021-08-08 DIAGNOSIS — Z9889 Other specified postprocedural states: Secondary | ICD-10-CM | POA: Diagnosis not present

## 2021-08-08 HISTORY — PX: IR RADIOLOGIST EVAL & MGMT: IMG5224

## 2021-08-08 NOTE — Progress Notes (Signed)
Patient ID: Donna May, female   DOB: Oct 21, 1940, 81 y.o.   MRN: [id number]        Chief Complaint: PIntrahepatic cholangiocarcinoma   Referring Physician(s): Feng,Yan   History of Present Illness: Donna May is a 81 y.o. female with past medical history significant for chronic kidney disease, diabetes, hyperlipidemia, hypertension, breast cancer, peripheral neuropathy, chronic diastolic CHF, biopsy-proven cholangiocarcinoma, post Y90 bland/radioembolization who presents to the interventional radiology clinic following acquisition of postprocedural PET/CT performed 07/24/2021.  In brief review, patient was diagnosed with cholangiocarcinoma on liver biopsy performed 10/03/2018.  She initially underwent attempted surgical resection however was found to have peritoneal metastasis at the time of the surgery and as such the surgery was aborted.  Since that time, the patient has undergone several rounds of systemic therapy, most recently gemcitabine with durvalumab, which overall she tolerated well except for persistent neuropathy primarily affecting her hands and lower legs.   Unfortunately, PET/CT performed 01/02/2021 demonstrates progression of disease, though fortunately, the disease appears limited to the liver and as such she was evaluated by the interventional radiology service on 01/20/2021, ultimately undergoing mapping Y90 radioembolization on 04/18/2021 and subsequent bland embolization of a replaced left hepatic artery arising from the left gastric artery and right hepatic artery Y90 radioembolization on 05/02/2021.  Note, initial plan was to perform Y90 radioembolization of the conventional left hepatic artery however this ultimately proved unsuccessful secondary to inability to cannulate this vessel with a microcatheter.   Patient reports worsening fatigue though denies other symptoms.  Specifically, no unintentional weight loss or weight gain.  No abdominal pain.  No yellowing of the skin  or eyes.  No bloody or melanotic stools.        Past Medical History:  Diagnosis Date   Anemia    Anxiety    Arthritis    Breast cancer ([place]) 1992   right   Cataract    Bilateral eyes - surgery to remove   Chronic diastolic CHF (congestive heart failure) (HCC)    Chronic kidney disease    CKD stage 3   Complication of anesthesia    "something they use make me itch for a couple of days."   Depression    Diabetes mellitus    type 2   Duodenitis 01/18/2002   Fainting spell    GERD (gastroesophageal reflux disease)    Heart murmur    never has caused any problems   Hiatal hernia 08/08/2008, 01/18/2002   History of pneumonia    x 2   Hyperlipidemia    Hypertension    Liver cancer ([place]) 08/2018   chemotherapy   Lymphedema 2017   Right arm   Peripheral neuropathy     Past Surgical History:  Procedure Laterality Date   AXILLARY SURGERY     cyst removal, right   COLONOSCOPY  09/23/2018   Dr. Havery Moros - polyps   EYE SURGERY Bilateral    cataracts to remove   IR 3D INDEPENDENT WKST  05/02/2021   IR ANGIOGRAM SELECTIVE EACH ADDITIONAL VESSEL  04/18/2021   IR ANGIOGRAM SELECTIVE EACH ADDITIONAL VESSEL  04/18/2021   IR ANGIOGRAM SELECTIVE EACH ADDITIONAL VESSEL  05/02/2021   IR ANGIOGRAM SELECTIVE EACH ADDITIONAL VESSEL  05/02/2021   IR ANGIOGRAM SELECTIVE EACH ADDITIONAL VESSEL  05/02/2021   IR ANGIOGRAM SELECTIVE EACH ADDITIONAL VESSEL  05/02/2021   IR ANGIOGRAM SELECTIVE EACH ADDITIONAL VESSEL  05/02/2021   IR ANGIOGRAM VISCERAL SELECTIVE  04/18/2021   IR ANGIOGRAM VISCERAL SELECTIVE  05/02/2021  IR EMBO TUMOR ORGAN ISCHEMIA INFARCT INC GUIDE ROADMAPPING  05/02/2021   IR RADIOLOGIST EVAL & MGMT  01/30/2021   IR RADIOLOGIST EVAL & MGMT  05/22/2021   IR US GUIDE VASC ACCESS RIGHT  04/18/2021   IR US GUIDE VASC ACCESS RIGHT  05/02/2021   LAPAROSCOPY N/A 11/17/2018   Procedure: LAPAROSCOPY DIAGNOSTIC, INTRAOPERATIVE ULTRASOUND, PERITONEAL BIOPSIES;  Surgeon: [name], MD;   Location: [facility];  Service: General;  Laterality: N/A;  GENERAL AND EPIDURAL   LIVER BIOPSY  08/2018   Dr. Lindwood Coke   MASTECTOMY  1992   right, with flap   NM [place]  06/11/2009   Protocol:[place], post stress EF58%, EKG negative for ischemia, low risk   PORTACATH PLACEMENT N/A 12/02/2018   Procedure: INSERTION PORT-A-CATH;  Surgeon: [name], MD;  Location: [facility];  Service: General;  Laterality: N/A;   RECONSTRUCTION BREAST W/ TRAM FLAP Right    TONSILLECTOMY  1958   TRANSTHORACIC ECHOCARDIOGRAM  12/24/2009   LVEF =>55%, normal study   UPPER GI ENDOSCOPY      Allergies: Patient has no known allergies.  Medications: Prior to Admission medications   Medication Sig Start Date End Date Taking? Authorizing Provider  amLODipine (NORVASC) 10 MG tablet Take 1 tablet (10 mg total) by mouth daily. 03/28/21   Deberah Pelton, NP  aspirin 81 MG tablet Take 81 mg by mouth daily.     [provider]  Baclofen 5 MG TABS TAKE 1/2 TABLET BY MOUTH ONCE DAILY AS NEEDED 12/20/20   [name], MD  blood glucose meter kit and supplies KIT Dispense based on patient and insurance preference. Use up to four times daily as directed. 07/01/21   [name], MD  carvedilol (COREG) 25 MG tablet Take 1 tablet (25 mg total) by mouth 2 (two) times daily with a meal. 04/16/21   [name], Mihai, MD  ezetimibe (ZETIA) 10 MG tablet Take 1 tablet (10 mg total) by mouth daily. 07/29/21 10/27/21  Deberah Pelton, NP  furosemide (LASIX) 40 MG tablet TAKE 1 TABLET BY MOUTH EVERY DAY 08/04/21   Deberah Pelton, NP  gabapentin (NEURONTIN) 100 MG capsule Take 1 capsule (100 mg total) by mouth every 8 (eight) hours as needed. 05/15/20   [name], MD  glucose blood [facility] VERIO) test strip Use up to 3 times daily to check blood sugar. 07/29/21   [name], MD  glucose blood test strip 1 each by Other route 2 (two) times daily. Use Onetouch verio test strips as instructed to check blood sugar twice  daily. 01/05/19   Elayne Snare, MD  hydrALAZINE (APRESOLINE) 100 MG tablet TAKE THREE TIMES DAILY-AM, MIDDLE OF THE DAY AND IN THE EVENING. 09/13/20   [name], Mihai, MD  KLOR-CON M10 10 MEQ tablet TAKE 1 TABLET BY MOUTH EVERY DAY 08/04/21   Deberah Pelton, NP  lansoprazole (PREVACID) 15 MG capsule Take 1 capsule (15 mg total) by mouth daily at 12 noon. Take 1 capsule by mouth once or twice a day as needed 11/01/20   [name], [name], MD  lidocaine-prilocaine (EMLA) cream Apply 1 application topically as needed. 08/21/20   [name], MD  loratadine (CLARITIN) 10 MG tablet Take 10 mg by mouth daily as needed for allergies.    [provider]  Omega 3 1000 MG CAPS Take 2,000 mg by mouth daily.     [provider]  oxyCODONE (ROXICODONE) 5 MG immediate release tablet Take 1  tablet (5 mg total) by mouth every 4 (four) hours as needed for severe pain. 05/02/21   Theresa Duty, NP  polyethylene glycol (MIRALAX / GLYCOLAX) 17 g packet Take 17 g by mouth daily as needed. 11/01/20   [name], [name], MD  prochlorperazine (COMPAZINE) 10 MG tablet Take 1 tablet (10 mg total) by mouth every 6 (six) hours as needed for nausea or vomiting. 03/25/21   [name], MD  regorafenib (STIVARGA) 40 MG tablet Take 2 tabs daily for first week, then increase to 3 tabs on second week and 4 tabs daily for third week, then off for one week. Take with low fat meal. Caution: Chemotherapy. 08/04/21   [name], MD  sitaGLIPtin (JANUVIA) 50 MG tablet Take 1 tablet (50 mg total) by mouth daily. 07/28/21   [name], MD  valsartan (DIOVAN) 320 MG tablet Take 1 tablet (320 mg total) by mouth daily. 06/13/21   [name], Mihai, MD     Family History  Problem Relation Age of Onset   Breast cancer Cousin        diagnosed >50; mother's first cousins   Diabetes Brother    Heart disease Brother    Heart disease Mother    Hypertension Mother    Multiple sclerosis Mother    Heart disease Father     Hypertension Father    Stroke Father    Prostate cancer Brother 48   Heart attack Maternal Grandmother    Stroke Maternal Grandfather    Colon cancer Neg Hx    Esophageal cancer Neg Hx    Stomach cancer Neg Hx    Rectal cancer Neg Hx     Social History   Socioeconomic History   Marital status: Single    Spouse name: Not on file   Number of children: 2   Years of education: 12   Highest education level: Not on file  Occupational History   Occupation: retired    Comment: disabled  Tobacco Use   Smoking status: Former    Packs/day: 0.25    Years: 25.00    Pack years: 6.25    Types: Cigarettes    Quit date: 1990    Years since quitting: 33.4   Smokeless tobacco: Never  Vaping Use   Vaping Use: Never used  Substance and Sexual Activity   Alcohol use: Not Currently    Alcohol/week: 0.0 standard drinks   Drug use: Never   Sexual activity: Yes    Partners: Male    Birth control/protection: Condom, Post-menopausal    Comment: First sexual encounter age 81. Fewer than 5 partners in life time.  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   Has had 3 Covid vaccines   Social Determinants of Radio broadcast assistant Strain: Not on file  Food Insecurity: Not on file  Transportation Needs: Not on file  Physical Activity: Not on file  Stress: Not on file  Social Connections: Not on file    ECOG Status: 1 - Symptomatic but completely ambulatory  Review of Systems: A 12 point ROS discussed and pertinent positives are indicated in the HPI above.  All other systems are negative.  Review of Systems  Vital Signs: BP [place] Kitchen)  153/51 (BP Location: Left Arm)   Pulse 70   LMP  (LMP Unknown)   SpO2 98%   Physical Exam  Imaging:  The following  examinations were reviewed in detail: PET/CT - 07/24/2021; 01/02/2021;  CT abdomen and pelvis - 03/07/2021;  Y90 radioembolization of the right lobe of the liver and bland embolization of the accessory left hepatic artery - 05/02/2021.  NM PET Image Restage (PS) Skull Base to Thigh (F-18 FDG)  Result Date: 07/28/2021 CLINICAL DATA:  Subsequent treatment strategy for cholangiocarcinoma. EXAM: NUCLEAR MEDICINE PET SKULL BASE TO THIGH TECHNIQUE: 6.52 mCi F-18 FDG was injected intravenously. Full-ring PET imaging was performed from the skull base to thigh after the radiotracer. CT data was obtained and used for attenuation correction and anatomic localization. Fasting blood glucose: 97 mg/dl COMPARISON:  01/02/2021 FINDINGS: Mediastinal blood pool activity: SUV max 1.59 Liver activity: SUV max NA NECK: No tracer avid lymph nodes or mass identified. Incidental CT findings: none CHEST: No FDG avid axillary, supraclavicular, mediastinal, or hilar lymph nodes. Incidental CT findings: No tracer avid pulmonary nodules. Aortic atherosclerosis. Coronary artery calcifications. ABDOMEN/PELVIS: Confluent tracer avid mass involving both lobes of liver centered around segment for measures 13.8 x 5.9 cm and has an SUV max of 5.68, image 95/4. On the previous examination this measured 7.0 x 3.6 cm with SUV max 5.2. Several new tracer avid liver lesions are identified including lesion within inferior aspect of segment 3 measures 3.1 x 2.5 cm and has an SUV max of 5.51, image 102/4. No abnormal tracer uptake identified within the pancreas, spleen or adrenal glands. No tracer avid nodal metastasis. Incidental CT findings: Similar scattered liver cysts. Bilateral Bosniak class 1 kidney cysts. No follow-up recommended. Aortic atherosclerosis. Small calcified uterine fibroids noted. New small volume of ascites identified within the pelvis. SKELETON: No signs of tracer avid bone metastases. Incidental CT findings: none IMPRESSION: 1.  Interval enlargement of tracer avid confluent mass involving both lobes of liver centered around segment 4. Several new sites of tracer avid disease identified within the liver. 2. No signs of tracer avid nodal metastasis or distant metastatic disease. 3.  Aortic Atherosclerosis (ICD10-I70.0). Electronically Signed   By: Kerby Moors M.D.   On: 07/28/2021 06:52    Labs:  CBC: Recent Labs    06/11/21 0921 07/09/21 0816 07/29/21 1410 07/31/21 0751  WBC 6.1 6.4 CANCELED 6.0  HGB 10.6* 10.9* CANCELED 11.2*  HCT 33.3* 33.3* CANCELED 34.5*  PLT 173 170 CANCELED 170    COAGS: Recent Labs    04/18/21 0825 05/02/21 0730  INR 1.2 1.1    BMP: Recent Labs    05/14/21 0823 06/11/21 0921 07/09/21 0816 07/31/21 0751  NA 140 139 140 138  K 4.3 4.3 4.8 4.4  CL 106 105 108 106  CO2 $Re'27 29 26 26  'eRn$ GLUCOSE 145* 82 94 186*  BUN 39* 40* 33* 26*  CALCIUM 8.9 9.1 8.7* 8.5*  CREATININE 1.62* 1.69* 1.51* 1.26*  GFRNONAA 32* 30* 34* 43*    LIVER FUNCTION TESTS: Recent Labs    05/14/21 0823 06/11/21 0921 07/09/21 0816 07/31/21 0751  BILITOT 0.3 0.4 0.3 0.4  AST $Re'16 21 27 31  'MlF$ ALT $R'8 10 14 13  'za$ ALKPHOS 76 87 104 102  PROT 6.9 7.2 7.1 7.0  ALBUMIN 3.6 3.7 3.6 3.4*    TUMOR MARKERS: No results for input(s): AFPTM, CEA, CA199, CHROMGRNA in the last 8760 hours.  Assessment and Plan:  DEEPA BARTHEL is a 81 y.o.  female with past medical history significant for chronic kidney disease, diabetes, hyperlipidemia, hypertension, breast cancer, peripheral neuropathy, chronic diastolic CHF, biopsy-proven cholangiocarcinoma, post Y90 bland/radioembolization who presents to the interventional radiology clinic following acquisition of postprocedural PET/CT performed 07/24/2021.   Patient reports worsening fatigue though denies other symptoms.  Specifically, no unintentional weight loss or weight gain.  No abdominal pain.  No yellowing of the skin or eyes.  No bloody or melanotic stools.  Images from  PET/CT performed 07/24/2021 and 01/02/2021 as well as preprocedural CTA images from 03/07/2021 were reviewed in detail with the patient.  Unfortunately, PET/CT performed 07/24/2021 demonstrates continued progression of disease with dominant hypoattenuating hepatic mass now measuring 13.8 cm in maximal diameter, previously, 7.0 cm when compared to PET/CT performed 12/2020.  Additionally, there has been suspected development of satellite lesions about the superior aspect of the dominant hypoattenuating hepatic lesions as well as additional satellite lesions within the lateral segment of the left lobe of the liver.  These findings are known to her referring oncologist, Dr. Burr Medico, who has initiated the patient on a new oral chemotherapy regimen (Stivarga).  Given lack of any significant response following bland/Y90 radioembolization I do NOT feel the patient would benefit from additional liver directed therapy.    I explained that while she remains largely asymptomatic her recent disease progression is significant and I reiterated the goals of care discussion which were initiated by Dr. Burr Medico.  Plan: - Initiation of oral chemotherapy at the discretion of Dr. Burr Medico. - No plan for additional liver directed therapy at this time.  Patient is to follow-up with interventional radiology clinic on a as needed basis  A copy of this report was sent to the requesting provider on this date.  Electronically Signed: Sandi Mariscal 08/08/2021, 10:19 AM   I spent a total of 40 Minutes in face to face in clinical consultation, greater than 50% of which was counseling/coordinating care for post Y-90.

## 2021-08-13 ENCOUNTER — Telehealth: Payer: Self-pay

## 2021-08-13 DIAGNOSIS — I10 Essential (primary) hypertension: Secondary | ICD-10-CM

## 2021-08-13 DIAGNOSIS — C787 Secondary malignant neoplasm of liver and intrahepatic bile duct: Secondary | ICD-10-CM

## 2021-08-13 DIAGNOSIS — Z79899 Other long term (current) drug therapy: Secondary | ICD-10-CM

## 2021-08-13 DIAGNOSIS — I5032 Chronic diastolic (congestive) heart failure: Secondary | ICD-10-CM

## 2021-08-13 NOTE — Telephone Encounter (Signed)
Received from [facility] a printout for pt stating that sample was too old from 07-29-21 when pt was here for appt. Pt needs to be re-drawn. Pt has ECHO appt 08-19-21 can this wait until then?

## 2021-08-13 NOTE — Telephone Encounter (Signed)
LM2CB; Left detailed message to call back to discuss lab draw to be done after ECHO 08-19-21.

## 2021-08-18 ENCOUNTER — Telehealth: Payer: Self-pay

## 2021-08-18 NOTE — Telephone Encounter (Signed)
This nurse received a call from this patient stating since starting Stivarga on May 18th she noticed that her urine has gotten dark, her balance is off and her neuropathy is worse.  Patient stated she has not taken Stivarga since Friday.  This nurse advised that her information will be forwarded to the provider for any recommendations.  No further questions or concerns at this time.

## 2021-08-18 NOTE — Telephone Encounter (Signed)
This nurse reached out to patient and advised that provider is willing to see her on 6/6 about her symptoms.  Patient stated that she is unable to come in any other day accept Friday. This nurse advised that provider will see her on Friday.  Acknowledged understanding and has no further questions or concerns at this time.

## 2021-08-19 ENCOUNTER — Other Ambulatory Visit: Payer: Self-pay | Admitting: Hematology

## 2021-08-19 ENCOUNTER — Ambulatory Visit (HOSPITAL_COMMUNITY): Payer: Medicare Other | Attending: Internal Medicine

## 2021-08-19 DIAGNOSIS — I5032 Chronic diastolic (congestive) heart failure: Secondary | ICD-10-CM | POA: Diagnosis not present

## 2021-08-19 DIAGNOSIS — C221 Intrahepatic bile duct carcinoma: Secondary | ICD-10-CM

## 2021-08-19 LAB — ECHOCARDIOGRAM COMPLETE
Area-P 1/2: 3.99 cm2
S' Lateral: 2.4 cm

## 2021-08-22 ENCOUNTER — Other Ambulatory Visit (HOSPITAL_COMMUNITY): Payer: Self-pay

## 2021-08-22 ENCOUNTER — Telehealth: Payer: Self-pay | Admitting: Pharmacy Technician

## 2021-08-22 ENCOUNTER — Other Ambulatory Visit: Payer: Self-pay | Admitting: Hematology and Oncology

## 2021-08-22 ENCOUNTER — Inpatient Hospital Stay: Payer: Medicare Other

## 2021-08-22 ENCOUNTER — Other Ambulatory Visit: Payer: Self-pay

## 2021-08-22 ENCOUNTER — Inpatient Hospital Stay: Payer: Medicare Other | Attending: Adult Health | Admitting: Hematology and Oncology

## 2021-08-22 ENCOUNTER — Encounter: Payer: Self-pay | Admitting: Hematology and Oncology

## 2021-08-22 VITALS — BP 158/50 | HR 64 | Temp 97.5°F | Resp 18 | Ht 65.0 in | Wt 131.8 lb

## 2021-08-22 DIAGNOSIS — C786 Secondary malignant neoplasm of retroperitoneum and peritoneum: Secondary | ICD-10-CM | POA: Diagnosis not present

## 2021-08-22 DIAGNOSIS — C221 Intrahepatic bile duct carcinoma: Secondary | ICD-10-CM

## 2021-08-22 DIAGNOSIS — I890 Lymphedema, not elsewhere classified: Secondary | ICD-10-CM | POA: Diagnosis not present

## 2021-08-22 DIAGNOSIS — G620 Drug-induced polyneuropathy: Secondary | ICD-10-CM | POA: Diagnosis not present

## 2021-08-22 DIAGNOSIS — G629 Polyneuropathy, unspecified: Secondary | ICD-10-CM | POA: Insufficient documentation

## 2021-08-22 DIAGNOSIS — C787 Secondary malignant neoplasm of liver and intrahepatic bile duct: Secondary | ICD-10-CM

## 2021-08-22 DIAGNOSIS — Z79899 Other long term (current) drug therapy: Secondary | ICD-10-CM | POA: Diagnosis not present

## 2021-08-22 DIAGNOSIS — Z95828 Presence of other vascular implants and grafts: Secondary | ICD-10-CM

## 2021-08-22 DIAGNOSIS — I10 Essential (primary) hypertension: Secondary | ICD-10-CM | POA: Diagnosis not present

## 2021-08-22 DIAGNOSIS — N183 Chronic kidney disease, stage 3 unspecified: Secondary | ICD-10-CM | POA: Insufficient documentation

## 2021-08-22 DIAGNOSIS — R300 Dysuria: Secondary | ICD-10-CM

## 2021-08-22 DIAGNOSIS — N1832 Chronic kidney disease, stage 3b: Secondary | ICD-10-CM | POA: Diagnosis not present

## 2021-08-22 DIAGNOSIS — E441 Mild protein-calorie malnutrition: Secondary | ICD-10-CM | POA: Insufficient documentation

## 2021-08-22 DIAGNOSIS — T451X5A Adverse effect of antineoplastic and immunosuppressive drugs, initial encounter: Secondary | ICD-10-CM | POA: Diagnosis not present

## 2021-08-22 DIAGNOSIS — I5032 Chronic diastolic (congestive) heart failure: Secondary | ICD-10-CM

## 2021-08-22 LAB — CBC WITH DIFFERENTIAL (CANCER CENTER ONLY)
Abs Immature Granulocytes: 0.05 10*3/uL (ref 0.00–0.07)
Basophils Absolute: 0 10*3/uL (ref 0.0–0.1)
Basophils Relative: 0 %
Eosinophils Absolute: 0.1 10*3/uL (ref 0.0–0.5)
Eosinophils Relative: 1 %
HCT: 33.3 % — ABNORMAL LOW (ref 36.0–46.0)
Hemoglobin: 10.8 g/dL — ABNORMAL LOW (ref 12.0–15.0)
Immature Granulocytes: 1 %
Lymphocytes Relative: 8 %
Lymphs Abs: 0.6 10*3/uL — ABNORMAL LOW (ref 0.7–4.0)
MCH: 29.5 pg (ref 26.0–34.0)
MCHC: 32.4 g/dL (ref 30.0–36.0)
MCV: 91 fL (ref 80.0–100.0)
Monocytes Absolute: 1 10*3/uL (ref 0.1–1.0)
Monocytes Relative: 13 %
Neutro Abs: 6.1 10*3/uL (ref 1.7–7.7)
Neutrophils Relative %: 77 %
Platelet Count: 192 10*3/uL (ref 150–400)
RBC: 3.66 MIL/uL — ABNORMAL LOW (ref 3.87–5.11)
RDW: 15.1 % (ref 11.5–15.5)
WBC Count: 7.9 10*3/uL (ref 4.0–10.5)
nRBC: 0 % (ref 0.0–0.2)

## 2021-08-22 LAB — URINALYSIS, COMPLETE (UACMP) WITH MICROSCOPIC
Bilirubin Urine: NEGATIVE
Glucose, UA: NEGATIVE mg/dL
Hgb urine dipstick: NEGATIVE
Ketones, ur: NEGATIVE mg/dL
Leukocytes,Ua: NEGATIVE
Nitrite: NEGATIVE
Protein, ur: NEGATIVE mg/dL
Specific Gravity, Urine: 1.016 (ref 1.005–1.030)
pH: 5 (ref 5.0–8.0)

## 2021-08-22 LAB — CMP (CANCER CENTER ONLY)
ALT: 14 U/L (ref 0–44)
AST: 34 U/L (ref 15–41)
Albumin: 3.1 g/dL — ABNORMAL LOW (ref 3.5–5.0)
Alkaline Phosphatase: 96 U/L (ref 38–126)
Anion gap: 5 (ref 5–15)
BUN: 49 mg/dL — ABNORMAL HIGH (ref 8–23)
CO2: 23 mmol/L (ref 22–32)
Calcium: 8.6 mg/dL — ABNORMAL LOW (ref 8.9–10.3)
Chloride: 111 mmol/L (ref 98–111)
Creatinine: 1.45 mg/dL — ABNORMAL HIGH (ref 0.44–1.00)
GFR, Estimated: 36 mL/min — ABNORMAL LOW (ref >60)
Glucose, Bld: 124 mg/dL — ABNORMAL HIGH (ref 70–99)
Potassium: 4.3 mmol/L (ref 3.5–5.1)
Sodium: 139 mmol/L (ref 135–145)
Total Bilirubin: 0.5 mg/dL (ref 0.3–1.2)
Total Protein: 6.5 g/dL (ref 6.5–8.1)

## 2021-08-22 MED ORDER — SODIUM CHLORIDE 0.9% FLUSH
10.0000 mL | Freq: Once | INTRAVENOUS | Status: AC
  Administered 2021-08-22: 10 mL

## 2021-08-22 MED ORDER — HEPARIN SOD (PORK) LOCK FLUSH 100 UNIT/ML IV SOLN
500.0000 [IU] | Freq: Once | INTRAVENOUS | Status: AC
  Administered 2021-08-22: 500 [IU]

## 2021-08-22 NOTE — Assessment & Plan Note (Signed)
She did not tolerate Stivarga well and has felt better since she stopped taking it She is wondering whether alternative treatment options available I will plan to discuss this with her primary oncologist next week about other treatment options In the meantime, I recommend supportive care with adequate hydration, frequent small meals and documentation of oral intake

## 2021-08-22 NOTE — Assessment & Plan Note (Signed)
She had recent malignant cachexia and have lost weight with signs of protein calorie malnutrition We discussed dietary modification and higher protein intake if possible

## 2021-08-22 NOTE — Telephone Encounter (Signed)
Patient Advocate Encounter Received notification from [facility] regarding a prior authorization for LANSOPRAZOLE '15MG'$ . Authorization has been APPROVED from 5.10.23 to 6.8.24.   Per test claim, copay for 90 days supply is $15   Authorization # PA Case ID: [id number]      Received notification from [facility] that prior authorization for LANSOPRAZOLE '15MG'$  is required.   PA submitted on 6.9.23 Key [id number] Status is pending   [facility] will continue to follow  Luciano Cutter, CPhT Patient Advocate Phone: [phone]

## 2021-08-22 NOTE — Progress Notes (Signed)
[place] progress notes  Patient Care Team: [name], MD as PCP - General (Family Medicine) [name], [name], MD as PCP - Cardiology (Cardiology) [name], [name], MD as Consulting Physician (Cardiology) [name], MD as Consulting Physician (Obstetrics and Gynecology) [name], [name], NP as Nurse Practitioner (Hematology and Oncology) [facility] [name], MD as Consulting Physician (Hematology) [name], [name], MD as Consulting Physician (Gastroenterology) [name], RN as Oncology Nurse Navigator [name], MD as Consulting Physician (General Surgery)  CHIEF COMPLAINTS/PURPOSE OF VISIT:  Feeling unwell, receiving chemotherapy for intrahepatic cholangiocarcinoma  HISTORY OF PRESENTING ILLNESS:  CATHELINE May 81 y.o. female is seen today in the absence of her primary oncologist She was prescribed Stivarga recently but did not tolerate the treatment She took it for few days and then had to stop She felt that the chemotherapy pill has caused significant worsening neuropathy, balance difficulties, passage of dark urine, generalized feeling unwell, reduced appetite and others She stated that her last dose was a week ago She felt better since She admits she is not eating consistently and has not drink enough liquid She drinks approximately 2 to 3 cups of water per day She is not consistently checking her blood pressure or blood sugar at home She denies recent fall  I reviewed the patient's records extensive and collaborated the history with the patient. Summary of her history is as follows: Oncology History Overview Note  Cancer Staging Intrahepatic cholangiocarcinoma ([place]) Staging form: Intrahepatic Bile Duct, AJCC 8th Edition - Clinical stage from 09/23/2018: Stage IB (cT1b, cN0, cM0) - Signed by [name], MD on 10/06/2018 Histologic grade (G): G3 Histologic grading system: 3 grade system     Intrahepatic cholangiocarcinoma ([place])  09/07/2018 Imaging   CT Chest IMPRESSION: 1. New, enhancing mass involving segment 4 of the liver and fundus of gallbladder is concerning for malignancy. This may represent either metastatic disease from breast cancer or neoplasm primary to the liver or hepatic biliary tree. Further evaluation with contrast enhanced CT of the abdomen and pelvis is recommended. 2. No findings to suggest metastatic disease within the chest. 3.  Aortic Atherosclerosis (ICD10-I70.0). 4. Coronary artery calcifications.   09/13/2018 Pathology Results   Diagnosis Liver, needle/core biopsy - ADENOCARCINOMA. Microscopic Comment Immunohistochemistry for CK7 is positive. CK20, TTF1, CDX-2, GATA-3, PAX 8, Qualitative ER, p63 and CK5/6 are negative. The provided clinical history of remote mammary carcinoma is noted. Based on the morphology and immunophenotype of the adenocarcinoma observed in this specimen, primary cholangiocarcinoma is favored. Clinical and radiologic correlation are  encouraged. Results reported to [employer] on 09/15/2018. Intradepartmental consultation (Dr. Vic Ripper).   09/13/2018 Initial Diagnosis   Cholangiocarcinoma ([place])   09/23/2018 Procedure   Colonoscopy by Dr. Havery Moros 09/23/18  IMPRESSION - Two 3 to 4 mm polyps in the ascending colon, removed with a cold snare. Resected and retrieved. - Five 3 to 5 mm polyps in the transverse colon, removed with a cold snare. Resected and retrieved. - One 5 mm polyp at the splenic flexure, removed with a cold snare. Resected and retrieved. - Three 3 to 5 mm polyps in the sigmoid colon, removed with a cold snare. Resected and retrieved. - The examination was otherwise normal. Upper Endopscy by Dr. Havery Moros 09/23/18  IMPRESSION - Esophagogastric landmarks identified. - 2 cm hiatal hernia. - Normal esophagus otherwise. - A single gastric polyp. Resected and retrieved. - Mild gastritis. Biopsied. - Normal  duodenal bulb and second portion of the duodenum.  09/23/2018 Pathology Results   Diagnosis 09/23/18 1. Surgical [P], duodenum - BENIGN SMALL BOWEL MUCOSA. - NO ACTIVE INFLAMMATION OR VILLOUS ATROPHY IDENTIFIED. 2. Surgical [P], stomach, polyp - HYPERPLASTIC POLYP(S). - THERE IS NO EVIDENCE OF MALIGNANCY. 3. Surgical [P], gastric antrum and gastric body - CHRONIC INACTIVE GASTRITIS. - THERE IS NO EVIDENCE OF HELICOBACTER-PYLORI, DYSPLASIA, OR MALIGNANCY. - SEE COMMENT. 4. Surgical [P], colon, sigmoid, splenic flexure, transverse and ascending, polyp (9) - TUBULAR ADENOMA(S). - SESSILE SERRATED POLYP WITHOUT CYTOLOGIC DYSPLASIA. - HIGH GRADE DYSPLASIA IS NOT IDENTIFIED. 5. Surgical [P], colon, sigmoid, polyp (2) - HYPERPLASTIC POLYP(S). - THERE IS NO EVIDENCE OF MALIGNANCY.   09/23/2018 Cancer Staging   Staging form: Intrahepatic Bile Duct, AJCC 8th Edition - Clinical stage from 09/23/2018: Stage IB (cT1b, cN0, cM0) - Signed by [name], MD on 10/06/2018   09/29/2018 PET scan   PET 09/29/18 IMPRESSION: 1. Hypermetabolic mass in the RIGHT hepatic lobe consistent with biopsy proven adenocarcinoma. No additional liver metastasis. 2. No evidence of local breast cancer recurrence in the RIGHT breast or RIGHT axilla. 3. Mild bilateral hypermetabolic adrenal glands is favored benign hyperplasia. 4. No evidence of additional metastatic disease on skull base to thigh FDG PET scan.   11/06/2018 Genetic Testing   Negative genetic testing on the [facility] Common Hereditary Cancers panel. A variant of uncertain significance was identified in one of her APC genes, called c.1243G>A (p.Ala415Thr).  The Common Hereditary Cancers Panel offered by [facility] includes sequencing and/or deletion duplication testing of the following 48 genes: APC, ATM, AXIN2, BARD1, BMPR1A, BRCA1, BRCA2, BRIP1, CDH1, CDK4, CDKN2A (p14ARF), CDKN2A (p16INK4a), CHEK2, CTNNA1, DICER1, EPCAM (Deletion/duplication testing only),  GREM1 (promoter region deletion/duplication testing only), KIT, MEN1, MLH1, MSH2, MSH3, MSH6, MUTYH, NBN, NF1, NHTL1, PALB2, PDGFRA, PMS2, POLD1, POLE, PTEN, RAD50, RAD51C, RAD51D, RNF43, SDHB, SDHC, SDHD, SMAD4, SMARCA4. STK11, TP53, TSC1, TSC2, and VHL.  The following genes were evaluated for sequence changes only: SDHA and HOXB13 c.251G>A variant only.    11/17/2018 Pathology Results   Diagnosis 11/17/18 1. Soft tissue, biopsy, Diaphragmatic nodules - METASTATIC ADENOCARCINOMA, CONSISTENT WITH PATIENT'S CLINICAL HISTORY OF CHOLANGIOCARCINOMA. SEE NOTE 2. Liver, biopsy, Left - LIVER PARENCHYMA WITH A BENIGN FIBROTIC NODULE - NO EVIDENCE OF MALIGNANCY 3. Stomach, biopsy - BENIGN PAPILLARY MESOTHELIAL HYPERPLASIA - NO EVIDENCE OF MALIGNANCY   11/29/2018 Imaging   CT CAP WO Contrast  IMPRESSION: 1. Dominant liver mass appears grossly stable from 09/29/2018. Additional liver lesions are too small to characterize but were not shown to be hypermetabolic on PET. 2. Mild nodularity of both adrenal glands with associated hypermetabolism on 09/29/2018. Continued attention on follow-up exams is warranted. 3. Small right lower lobe nodules, stable from 09/07/2018. Again, attention on follow-up is recommended. 4. Trace bilateral pleural fluid. 5. Aortic atherosclerosis (ICD10-170.0). Coronary artery calcification. 6. Enlarged pulmonic trunk, indicative of pulmonary arterial hypertension.     11/30/2018 - 06/14/2019 Chemotherapy   First line chemo Oxaliplatin and gemcitabine q2weeks starting 11/30/18. Stopped Oxaliplatin on 05/03/19 due to worsening neuropathy. Added Cisplatin with C13 on 05/17/19 and stopped on C15 06/14/19 due to neuropathy. Reduced to maintenance single agent Gemcitabine on 06/28/19.    01/20/2019 Imaging   CT CAP IMPRESSION: restaging  1. Mild interval increase in size of dominant liver mass involving segment 4 and segment 5. 2. No significant or progressive adrenal nodularity  identified to suggest metastatic disease. 3. Unchanged appearance of small right lower lobe and lingular lung nodules, nonspecific.   03/21/2019 PET scan   IMPRESSION:  1. Large right hepatic mass with SUV uptake near background hepatic activity, dramatic response to therapy, also with decrease in size when compared to the prior study. 2. Signs of prior right mastectomy and axillary dissection as before. 3. Left adrenal activity in no longer above the level of activity seen in the contralateral, right adrenal gland or in the liver. Attention on follow-up. 4. No new signs of disease.   06/27/2019 PET scan   IMPRESSION: 1. Further decrease in size of a dominant hepatic mass which is non FDG avid. 2. No evidence of metastatic disease. 3. No evidence of left adrenal hypermetabolism or mass. 4. Incidental findings, including: Uterine fibroids. Coronary artery atherosclerosis. Aortic Atherosclerosis (ICD10-I70.0). Pulmonary artery enlargement suggests pulmonary arterial hypertension.   06/28/2019 -  Chemotherapy   Maintenance single agent Gemcitabine q2weeks starting on 06/28/19 with C16.  ------On chemo break since 12/27/19.  ------She restarted Single Agent Gemcitabine q2weeks on 04/03/20 due to mild disease progression in liver on 02/2020 PET    09/15/2019 PET scan   IMPRESSION: 1. In the region of regional tumor at the junction of the right and left hepatic lobe, there is continued hypodensity and overall activity slightly less than that of the surrounding normal liver, indicating effective response to therapy. No current worrisome hypermetabolic lesion is identified in the liver or elsewhere. 2. Chronic asymmetric edema in the subcutaneous tissues of the right upper extremity, nonspecific. The patient has had a prior right mastectomy and right axillary dissection. 3. Other imaging findings of potential clinical significance: Aortic Atherosclerosis (ICD10-I70.0). Coronary  atherosclerosis. Mild cardiomegaly. Small right and trace left pleural effusions. Subcutaneous and mild mesenteric edema. Suspected uterine fibroids.   12/18/2019 PET scan   IMPRESSION: 1. No significant abnormal activity to suggest active/recurrent malignancy. 2. Lingual tonsillar activity is mildly increased from prior but probably incidental/physiologic, attention on follow up suggested. 3. Other imaging findings of potential clinical significance: Third spacing of fluid with trace ascites, mesenteric edema, subcutaneous edema, and possibly subtle pulmonary edema. Mild cardiomegaly. Aortic Atherosclerosis (ICD10-I70.0). Coronary atherosclerosis. Right ovarian dermoid. Calcified uterine fibroids. Renal cysts. Right glenohumeral arthropathy. Mild to moderate scoliosis. Suspected pulmonary arterial hypertension.   03/14/2020 PET scan   IMPRESSION: 1. New small focus of hypermetabolism in segment 4A of the liver worrisome for new metastatic focus. 2. No other findings for metastatic disease involving the neck, chest, abdomen or pelvis.     06/11/2020 PET scan   IMPRESSION: 1. No suspicious hypermetabolic activity within the neck, chest, abdomen or pelvis. 2. The treated dominant peripheral cholangiocarcinoma appears slightly enlarged compared with recent prior studies, and there is a questionable new lesion more superiorly in the left lobe. Although without associated hypermetabolic activity, these lesions are suspicious and would be better assessed with abdominal MRI without and with contrast.   09/17/2020 PET scan   IMPRESSION: 1. Recurrent hypermetabolic activity around the periphery of the dominant hepatic mass consistent with local recurrence. This mass appears slightly larger. Possible small metastasis in the dome of the left hepatic lobe is unchanged in size, without hypermetabolic activity. 2. No distant metastases identified. 3. Stable incidental findings as detailed above.    10/16/2020 -  Chemotherapy   Patient is on Treatment Plan : BLADDER Durvalumab q28d     01/02/2021 PET scan   IMPRESSION: 1. Further enlargement of hypermetabolic hepatic mass superior to the cholecystectomy bed consistent with progressive local tumor recurrence. Satellite lesion more superiorly in the anterior dome of the liver has also enlarged, without definite  hypermetabolic activity. 2. No distant metastases identified. 3. Stable incidental findings including aortic atherosclerosis, hepatic and renal cysts and uterine fibroids.   07/24/2021 PET scan   IMPRESSION: 1. Interval enlargement of tracer avid confluent mass involving both lobes of liver centered around segment 4. Several new sites of tracer avid disease identified within the liver. 2. No signs of tracer avid nodal metastasis or distant metastatic disease. 3.  Aortic Atherosclerosis (ICD10-I70.0).     MEDICAL HISTORY:  Past Medical History:  Diagnosis Date   Anemia    Anxiety    Arthritis    Breast cancer ([place]) 1992   right   Cataract    Bilateral eyes - surgery to remove   Chronic diastolic CHF (congestive heart failure) ([place])    Chronic kidney disease    CKD stage 3   Complication of anesthesia    "something they use make me itch for a couple of days."   Depression    Diabetes mellitus    type 2   Duodenitis 01/18/2002   Fainting spell    GERD (gastroesophageal reflux disease)    Heart murmur    never has caused any problems   Hiatal hernia 08/08/2008, 01/18/2002   History of pneumonia    x 2   Hyperlipidemia    Hypertension    Liver cancer ([place]) 08/2018   chemotherapy   Lymphedema 2017   Right arm   Peripheral neuropathy     SURGICAL HISTORY: Past Surgical History:  Procedure Laterality Date   AXILLARY SURGERY     cyst removal, right   COLONOSCOPY  09/23/2018   Dr. Havery Moros - polyps   EYE SURGERY Bilateral    cataracts to remove   IR 3D INDEPENDENT WKST  05/02/2021   IR ANGIOGRAM  SELECTIVE EACH ADDITIONAL VESSEL  04/18/2021   IR ANGIOGRAM SELECTIVE EACH ADDITIONAL VESSEL  04/18/2021   IR ANGIOGRAM SELECTIVE EACH ADDITIONAL VESSEL  05/02/2021   IR ANGIOGRAM SELECTIVE EACH ADDITIONAL VESSEL  05/02/2021   IR ANGIOGRAM SELECTIVE EACH ADDITIONAL VESSEL  05/02/2021   IR ANGIOGRAM SELECTIVE EACH ADDITIONAL VESSEL  05/02/2021   IR ANGIOGRAM SELECTIVE EACH ADDITIONAL VESSEL  05/02/2021   IR ANGIOGRAM VISCERAL SELECTIVE  04/18/2021   IR ANGIOGRAM VISCERAL SELECTIVE  05/02/2021   IR EMBO TUMOR ORGAN ISCHEMIA INFARCT INC GUIDE ROADMAPPING  05/02/2021   IR RADIOLOGIST EVAL & MGMT  01/30/2021   IR RADIOLOGIST EVAL & MGMT  05/22/2021   IR RADIOLOGIST EVAL & MGMT  08/08/2021   IR US GUIDE VASC ACCESS RIGHT  04/18/2021   IR US GUIDE VASC ACCESS RIGHT  05/02/2021   LAPAROSCOPY N/A 11/17/2018   Procedure: LAPAROSCOPY DIAGNOSTIC, INTRAOPERATIVE ULTRASOUND, PERITONEAL BIOPSIES;  Surgeon: [name], MD;  Location: [facility];  Service: General;  Laterality: N/A;  GENERAL AND EPIDURAL   LIVER BIOPSY  08/2018   Dr. Lindwood Coke   MASTECTOMY  1992   right, with flap   NM [place]  06/11/2009   Protocol:[place], post stress EF58%, EKG negative for ischemia, low risk   PORTACATH PLACEMENT N/A 12/02/2018   Procedure: INSERTION PORT-A-CATH;  Surgeon: [name], MD;  Location: [facility];  Service: General;  Laterality: N/A;   RECONSTRUCTION BREAST W/ TRAM FLAP Right    TONSILLECTOMY  1958   TRANSTHORACIC ECHOCARDIOGRAM  12/24/2009   LVEF =>55%, normal study   UPPER GI ENDOSCOPY      SOCIAL HISTORY: Social History   Socioeconomic History   Marital status: Single    Spouse  name: Not on file   Number of children: 2   Years of education: 62   Highest education level: Not on file  Occupational History   Occupation: retired    Comment: disabled  Tobacco Use   Smoking status: Former    Packs/day: 0.25    Years: 25.00    Total pack years: 6.25    Types: Cigarettes    Quit date: 1990    Years  since quitting: 33.4   Smokeless tobacco: Never  Vaping Use   Vaping Use: Never used  Substance and Sexual Activity   Alcohol use: Not Currently    Alcohol/week: 0.0 standard drinks of alcohol   Drug use: Never   Sexual activity: Yes    Partners: Male    Birth control/protection: Condom, Post-menopausal    Comment: First sexual encounter age 27. Fewer than 5 partners in life time.  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   Has had 3 Covid vaccines   Social Determinants of Health   Financial Resource Strain: Low Risk  (04/11/2020)   Overall Financial Resource Strain (CARDIA)    Difficulty of Paying Living Expenses: Not hard at all  Food Insecurity: No Food Insecurity (04/11/2020)   Hunger Vital Sign    Worried About Running Out of Food in the Last Year: Never true    [place] in the Last Year: Never true  Transportation Needs: No Transportation Needs (04/11/2020)   PRAPARE - Hydrologist (Medical): No    Lack of Transportation (Non-Medical): No  Physical Activity: Inactive (04/11/2020)   Exercise Vital Sign    Days of Exercise per Week: 0 days    Minutes of Exercise per Session: 0 min  Stress: No Stress Concern Present (04/11/2020)   [place]    Feeling of Stress : Not at all  Social Connections: Moderately Isolated (04/11/2020)   Social Connection and Isolation Panel [NHANES]    Frequency of Communication with Friends and Family: More than three times a week    Frequency of Social Gatherings with Friends and Family: More than three times a week    Attends Religious Services: More than 4 times per year    Active Member of [employer] or Organizations: No     Attends Archivist Meetings: Never    Marital Status: Widowed  Intimate Partner Violence: Not At Risk (04/11/2020)   Humiliation, Afraid, Rape, and Kick questionnaire    Fear of Current or Ex-Partner: No    Emotionally Abused: No    Physically Abused: No    Sexually Abused: No    FAMILY HISTORY: Family History  Problem Relation Age of Onset   Breast cancer Cousin        diagnosed >50; mother's first cousins   Diabetes Brother    Heart disease Brother    Heart disease Mother    Hypertension Mother    Multiple sclerosis Mother    Heart disease Father    Hypertension Father    Stroke Father    Prostate cancer Brother 85   Heart attack Maternal Grandmother    Stroke Maternal Grandfather    Colon cancer Neg  Hx    Esophageal cancer Neg Hx    Stomach cancer Neg Hx    Rectal cancer Neg Hx     ALLERGIES:  has No Known Allergies.  MEDICATIONS:  Current Outpatient Medications  Medication Sig Dispense Refill   amLODipine (NORVASC) 10 MG tablet Take 1 tablet (10 mg total) by mouth daily. 90 tablet 1   aspirin 81 MG tablet Take 81 mg by mouth daily.      Baclofen 5 MG TABS TAKE 1/2 TABLET BY MOUTH ONCE DAILY AS NEEDED 42 tablet 1   blood glucose meter kit and supplies KIT Dispense based on patient and insurance preference. Use up to four times daily as directed. 1 each 0   carvedilol (COREG) 25 MG tablet Take 1 tablet (25 mg total) by mouth 2 (two) times daily with a meal. 180 tablet 3   ezetimibe (ZETIA) 10 MG tablet Take 1 tablet (10 mg total) by mouth daily. 30 tablet 3   furosemide (LASIX) 40 MG tablet TAKE 1 TABLET BY MOUTH EVERY DAY 90 tablet 1   gabapentin (NEURONTIN) 100 MG capsule Take 1 capsule (100 mg total) by mouth every 8 (eight) hours as needed. 180 capsule 2   glucose blood (ONETOUCH VERIO) test strip Use up to 3 times daily to check blood sugar. 100 strip 3   glucose blood test strip 1 each by Other route 2 (two) times daily. Use Onetouch verio test  strips as instructed to check blood sugar twice daily. 100 each 2   hydrALAZINE (APRESOLINE) 100 MG tablet TAKE THREE TIMES DAILY-AM, MIDDLE OF THE DAY AND IN THE EVENING. 270 tablet 3   KLOR-CON M10 10 MEQ tablet TAKE 1 TABLET BY MOUTH EVERY DAY 90 tablet 1   lansoprazole (PREVACID) 15 MG capsule Take 1 capsule (15 mg total) by mouth daily at 12 noon. Take 1 capsule by mouth once or twice a day as needed 90 capsule 3   lidocaine-prilocaine (EMLA) cream Apply 1 application topically as needed. 30 g 1   loratadine (CLARITIN) 10 MG tablet Take 10 mg by mouth daily as needed for allergies.     Omega 3 1000 MG CAPS Take 2,000 mg by mouth daily.      oxyCODONE (ROXICODONE) 5 MG immediate release tablet Take 1 tablet (5 mg total) by mouth every 4 (four) hours as needed for severe pain. 20 tablet 0   polyethylene glycol (MIRALAX / GLYCOLAX) 17 g packet Take 17 g by mouth daily as needed. 14 each 0   prochlorperazine (COMPAZINE) 10 MG tablet Take 1 tablet (10 mg total) by mouth every 6 (six) hours as needed for nausea or vomiting. 30 tablet 3   sitaGLIPtin (JANUVIA) 50 MG tablet Take 1 tablet (50 mg total) by mouth daily. 90 tablet 1   STIVARGA 40 MG tablet TAKE 2 TABS DAILY FOR FIRST WEEK, THEN INCREASE TO 3 TABS ON SECOND WEEK AND 4 TABS DAILY FOR THIRD WEEK, THEN OFF FOR ONE WEEK. TAKE WITH LOW FAT MEAL. 63 tablet 0   valsartan (DIOVAN) 320 MG tablet Take 1 tablet (320 mg total) by mouth daily. 90 tablet 3   Current Facility-Administered Medications  Medication Dose Route Frequency Provider Last Rate Last Admin   triamcinolone acetonide (KENALOG) 10 MG/ML injection 10 mg  10 mg Other Once Harriet Masson, DPM        REVIEW OF SYSTEMS:  All other systems were reviewed with the patient and are negative.  PHYSICAL EXAMINATION: ECOG PERFORMANCE STATUS:  2 - Symptomatic, <50% confined to bed  Vitals:   08/22/21 1038  BP: (!) 158/50  Pulse: 64  Resp: 18  Temp: (!) 97.5 F (36.4 C)  SpO2: 100%    Filed Weights   08/22/21 1038  Weight: 131 lb 12.8 oz (59.8 kg)    GENERAL:alert, no distress and comfortable SKIN: skin color, texture, turgor are normal, no rashes or significant lesions EYES: normal, conjunctiva are pink and non-injected, sclera clear OROPHARYNX:no exudate, normal lips, buccal mucosa, and tongue  NECK: supple, thyroid normal size, non-tender, without nodularity LYMPH:  no palpable lymphadenopathy in the cervical, axillary or inguinal LUNGS: clear to auscultation and percussion with normal breathing effort HEART: regular rate & rhythm and no murmurs without lower extremity edema ABDOMEN:abdomen palpable right upper quadrant mass Musculoskeletal:no cyanosis of digits and no clubbing  PSYCH: alert & oriented x 3 with fluent speech NEURO: no focal motor/sensory deficits  LABORATORY DATA:  I have reviewed the data as listed Lab Results  Component Value Date   WBC 7.9 08/22/2021   HGB 10.8 (L) 08/22/2021   HCT 33.3 (L) 08/22/2021   MCV 91.0 08/22/2021   PLT 192 08/22/2021   Recent Labs    03/28/21 0842 04/09/21 0831 07/09/21 0816 07/31/21 0751 08/22/21 1104  NA  --    < > 140 138 139  K  --    < > 4.8 4.4 4.3  CL  --    < > 108 106 111  CO2  --    < > _0 GLUCOSE  --    < > 94 186* 124*  BUN  --    < > 33* 26* 49*  CREATININE  --    < > 1.51* 1.26* 1.45*  CALCIUM  --    < > 8.7* 8.5* 8.6*  GFRNONAA  --    < > 34* 43* 36*  PROT 7.0   < > 7.1 7.0 6.5  ALBUMIN 4.1   < > 3.6 3.4* 3.1*  AST 23   < > 27 31 34  ALT 9   < > _1 ALKPHOS 106   < > 104 102 96  BILITOT <0.2   < > 0.3 0.4 0.5  BILIDIR <0.10  --   --   --   --    < > = values in this interval not displayed.    RADIOGRAPHIC STUDIES: I have personally reviewed the radiological images as listed and agreed with the findings in the report. ECHOCARDIOGRAM COMPLETE  Result Date: 08/19/2021    ECHOCARDIOGRAM REPORT   Patient Name:   Donna May Date of Exam: 08/19/2021 Medical Rec #:   [id number]     Height:       65.0 in Accession #:    [id number]    Weight:       132.9 lb Date of Birth:  09/12/1940    BSA:          1.663 m Patient Age:    10 years      BP:           148/52 mmHg Patient Gender: F             HR:           62 bpm. Exam Location:  [address] Procedure: 2D Echo, Cardiac Doppler, Color Doppler and Strain Analysis Indications:    H15.05 Chronic Diastolic Heart Failure  History:  Patient has no prior history of Echocardiogram examinations.                 Risk Factors:Hypertension, Dyslipidemia and Diabetes. Chronic                 kidney disease. Breast cancer. Murmur.  Sonographer:    Cresenciano Lick RDCS Referring Phys: [id number] [place]  1. Left ventricular ejection fraction, by estimation, is 60 to 65%. The left ventricle has normal function. The left ventricle has no regional wall motion abnormalities. There is moderate concentric left ventricular hypertrophy. Left ventricular diastolic parameters are consistent with Grade I diastolic dysfunction (impaired relaxation). The average left ventricular global longitudinal strain is -21.9 %. The global longitudinal strain is normal. No evidence of apical sparing pattern.  2. Right ventricular systolic function is normal. The right ventricular size is normal. There is normal pulmonary artery systolic pressure. The estimated right ventricular systolic pressure is 26.3 mmHg.  3. The mitral valve is normal in structure. No evidence of mitral valve regurgitation. No evidence of mitral stenosis.  4. The aortic valve is tricuspid. Aortic valve regurgitation is not visualized. Comparison(s): Improved RVSP and estimated LAP. FINDINGS  Left Ventricle: Left ventricular ejection fraction, by estimation, is 60 to 65%. The left ventricle has normal function. The left ventricle has no regional wall motion abnormalities. The average left ventricular global longitudinal strain is -21.9 %. The global longitudinal strain  is normal. The left ventricular internal cavity size was small. There is moderate concentric left ventricular hypertrophy. Left ventricular diastolic parameters are consistent with Grade I diastolic dysfunction (impaired relaxation). Right Ventricle: The right ventricular size is normal. No increase in right ventricular wall thickness. Right ventricular systolic function is normal. There is normal pulmonary artery systolic pressure. The tricuspid regurgitant velocity is 2.73 m/s, and  with an assumed right atrial pressure of 3 mmHg, the estimated right ventricular systolic pressure is 78.5 mmHg. Left Atrium: Left atrial size was normal in size. Right Atrium: Right atrial size was normal in size. Pericardium: There is no evidence of pericardial effusion. Mitral Valve: The mitral valve is normal in structure. No evidence of mitral valve regurgitation. No evidence of mitral valve stenosis. Tricuspid Valve: The tricuspid valve is normal in structure. Tricuspid valve regurgitation is mild . No evidence of tricuspid stenosis. Aortic Valve: The aortic valve is tricuspid. Aortic valve regurgitation is not visualized. Pulmonic Valve: The pulmonic valve was normal in structure. Pulmonic valve regurgitation is not visualized. No evidence of pulmonic stenosis. Aorta: The aortic root and ascending aorta are structurally normal, with no evidence of dilitation. IAS/Shunts: No atrial level shunt detected by color flow Doppler.  LEFT VENTRICLE PLAX 2D LVIDd:         3.60 cm   Diastology LVIDs:         2.40 cm   LV e' medial:    5.98 cm/s LV PW:         1.20 cm   LV E/e' medial:  11.1 LV IVS:        1.20 cm   LV e' lateral:   6.09 cm/s LVOT diam:     2.00 cm   LV E/e' lateral: 10.9 LV SV:         65 LV SV Index:   39        2D Longitudinal Strain LVOT Area:     3.14 cm  2D Strain GLS (A2C):   -24.5 %  2D Strain GLS (A3C):   -20.4 %                          2D Strain GLS (A4C):   -20.8 %                           2D Strain GLS Avg:     -21.9 % RIGHT VENTRICLE RV Basal diam:  3.80 cm RV S prime:     19.85 cm/s TAPSE (M-mode): 2.3 cm LEFT ATRIUM           Index        RIGHT ATRIUM           Index LA diam:      3.80 cm 2.29 cm/m   RA Area:     11.80 cm LA Vol (A2C): 33.0 ml 19.85 ml/m  RA Volume:   28.70 ml  17.26 ml/m LA Vol (A4C): 42.6 ml 25.62 ml/m  AORTIC VALVE LVOT Vmax:   94.90 cm/s LVOT Vmean:  59.450 cm/s LVOT VTI:    0.207 m  AORTA Ao Root diam: 2.90 cm Ao Asc diam:  3.10 cm MITRAL VALVE               TRICUSPID VALVE MV Area (PHT): 3.99 cm    TR Peak grad:   29.8 mmHg MV Decel Time: 190 msec    TR Vmax:        273.00 cm/s MV E velocity: 66.10 cm/s MV A velocity: 75.10 cm/s  SHUNTS MV E/A ratio:  0.88        Systemic VTI:  0.21 m                            Systemic Diam: 2.00 cm Rudean Haskell MD Electronically signed by Rudean Haskell MD Signature Date/Time: 08/19/2021/6:26:20 PM    Final    IR Radiologist Eval & Mgmt  Result Date: 08/08/2021 Please refer to notes tab for details about interventional procedure. (Op Note)  NM PET Image Restage (PS) Skull Base to Thigh (F-18 FDG)  Result Date: 07/28/2021 CLINICAL DATA:  Subsequent treatment strategy for cholangiocarcinoma. EXAM: NUCLEAR MEDICINE PET SKULL BASE TO THIGH TECHNIQUE: 6.52 mCi F-18 FDG was injected intravenously. Full-ring PET imaging was performed from the skull base to thigh after the radiotracer. CT data was obtained and used for attenuation correction and anatomic localization. Fasting blood glucose: 97 mg/dl COMPARISON:  01/02/2021 FINDINGS: Mediastinal blood pool activity: SUV max 1.59 Liver activity: SUV max NA NECK: No tracer avid lymph nodes or mass identified. Incidental CT findings: none CHEST: No FDG avid axillary, supraclavicular, mediastinal, or hilar lymph nodes. Incidental CT findings: No tracer avid pulmonary nodules. Aortic atherosclerosis. Coronary artery calcifications. ABDOMEN/PELVIS: Confluent tracer avid mass  involving both lobes of liver centered around segment for measures 13.8 x 5.9 cm and has an SUV max of 5.68, image 95/4. On the previous examination this measured 7.0 x 3.6 cm with SUV max 5.2. Several new tracer avid liver lesions are identified including lesion within inferior aspect of segment 3 measures 3.1 x 2.5 cm and has an SUV max of 5.51, image 102/4. No abnormal tracer uptake identified within the pancreas, spleen or adrenal glands. No tracer avid nodal metastasis. Incidental CT findings: Similar scattered liver cysts. Bilateral Bosniak class 1 kidney cysts. No follow-up recommended. Aortic atherosclerosis. Small calcified uterine fibroids noted.  New small volume of ascites identified within the pelvis. SKELETON: No signs of tracer avid bone metastases. Incidental CT findings: none IMPRESSION: 1. Interval enlargement of tracer avid confluent mass involving both lobes of liver centered around segment 4. Several new sites of tracer avid disease identified within the liver. 2. No signs of tracer avid nodal metastasis or distant metastatic disease. 3.  Aortic Atherosclerosis (ICD10-I70.0). Electronically Signed   By: Kerby Moors M.D.   On: 07/28/2021 06:52    ASSESSMENT & PLAN:  Intrahepatic cholangiocarcinoma ([place]) She did not tolerate Stivarga well and has felt better since she stopped taking it She is wondering whether alternative treatment options available I will plan to discuss this with her primary oncologist next week about other treatment options In the meantime, I recommend supportive care with adequate hydration, frequent small meals and documentation of oral intake  CKD (chronic kidney disease), stage III ([place]) She has slight acute on chronic renal failure due to dehydration We discussed importance of adequate fluid intake  Chemotherapy-induced peripheral neuropathy ([place]) She had worsening neuropathy recently causing balance issues She will continue current prescribed dose of pain  medicine and gabapentin  Mild protein-calorie malnutrition ([place]) She had recent malignant cachexia and have lost weight with signs of protein calorie malnutrition We discussed dietary modification and higher protein intake if possible  Orders Placed This Encounter  Procedures   Urinalysis, Complete w Microscopic    Standing Status:   Future    Number of Occurrences:   1    Standing Expiration Date:   08/23/2022    All questions were answered. The patient knows to call the clinic with any problems, questions or concerns. The total time spent in the appointment was 30 minutes encounter with patients including review of chart and various tests results, discussions about plan of care and coordination of care plan   Heath Lark, MD 08/22/2021 12:10 PM

## 2021-08-22 NOTE — Assessment & Plan Note (Signed)
She has slight acute on chronic renal failure due to dehydration We discussed importance of adequate fluid intake

## 2021-08-22 NOTE — Assessment & Plan Note (Signed)
She had worsening neuropathy recently causing balance issues She will continue current prescribed dose of pain medicine and gabapentin

## 2021-08-25 NOTE — Progress Notes (Unsigned)
Cardiology Clinic Note   Patient Name: Donna May Date of Encounter: 08/25/2021  Primary Care Provider:  [name], MD Primary Cardiologist:  Sanda Klein, MD  Patient Profile    Donna May 81 year old female presents the clinic today for  follow-up evaluation of her acute diastolic heart failure and hypertension.  Past Medical History    Past Medical History:  Diagnosis Date   Anemia    Anxiety    Arthritis    Breast cancer ([place]) 1992   right   Cataract    Bilateral eyes - surgery to remove   Chronic diastolic CHF (congestive heart failure) ([place])    Chronic kidney disease    CKD stage 3   Complication of anesthesia    "something they use make me itch for a couple of days."   Depression    Diabetes mellitus    type 2   Duodenitis 01/18/2002   Fainting spell    GERD (gastroesophageal reflux disease)    Heart murmur    never has caused any problems   Hiatal hernia 08/08/2008, 01/18/2002   History of pneumonia    x 2   Hyperlipidemia    Hypertension    Liver cancer ([place]) 08/2018   chemotherapy   Lymphedema 2017   Right arm   Peripheral neuropathy    Past Surgical History:  Procedure Laterality Date   AXILLARY SURGERY     cyst removal, right   COLONOSCOPY  09/23/2018   Dr. Havery Moros - polyps   EYE SURGERY Bilateral    cataracts to remove   IR 3D INDEPENDENT WKST  05/02/2021   IR ANGIOGRAM SELECTIVE EACH ADDITIONAL VESSEL  04/18/2021   IR ANGIOGRAM SELECTIVE EACH ADDITIONAL VESSEL  04/18/2021   IR ANGIOGRAM SELECTIVE EACH ADDITIONAL VESSEL  05/02/2021   IR ANGIOGRAM SELECTIVE EACH ADDITIONAL VESSEL  05/02/2021   IR ANGIOGRAM SELECTIVE EACH ADDITIONAL VESSEL  05/02/2021   IR ANGIOGRAM SELECTIVE EACH ADDITIONAL VESSEL  05/02/2021   IR ANGIOGRAM SELECTIVE EACH ADDITIONAL VESSEL  05/02/2021   IR ANGIOGRAM VISCERAL SELECTIVE  04/18/2021   IR ANGIOGRAM VISCERAL SELECTIVE  05/02/2021   IR EMBO TUMOR ORGAN ISCHEMIA INFARCT INC GUIDE ROADMAPPING  05/02/2021   IR  RADIOLOGIST EVAL & MGMT  01/30/2021   IR RADIOLOGIST EVAL & MGMT  05/22/2021   IR RADIOLOGIST EVAL & MGMT  08/08/2021   IR US GUIDE VASC ACCESS RIGHT  04/18/2021   IR US GUIDE VASC ACCESS RIGHT  05/02/2021   LAPAROSCOPY N/A 11/17/2018   Procedure: LAPAROSCOPY DIAGNOSTIC, INTRAOPERATIVE ULTRASOUND, PERITONEAL BIOPSIES;  Surgeon: [name], MD;  Location: [facility];  Service: General;  Laterality: N/A;  GENERAL AND EPIDURAL   LIVER BIOPSY  08/2018   Dr. Lindwood Coke   MASTECTOMY  1992   right, with flap   NM [place]  06/11/2009   Protocol:[place], post stress EF58%, EKG negative for ischemia, low risk   PORTACATH PLACEMENT N/A 12/02/2018   Procedure: INSERTION PORT-A-CATH;  Surgeon: [name], MD;  Location: [facility];  Service: General;  Laterality: N/A;   RECONSTRUCTION BREAST W/ TRAM FLAP Right    TONSILLECTOMY  1958   TRANSTHORACIC ECHOCARDIOGRAM  12/24/2009   LVEF =>55%, normal study   UPPER GI ENDOSCOPY      Allergies  No Known Allergies  History of Present Illness    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   She was admitted to the hospital 01/18/2020 until 01/22/2020.  She was diagnosed with acute on chronic diastolic CHF, diabetes mellitus with complications of stage III CKD and hypertension.  She presented to the emergency department via EMS for evaluation of sudden onset of shortness of breath that had been present for approximately 6 hours.  When EMS arrived at her place of residence patient had a oxygen saturation of around 70%.  She was placed on nonrebreather but was unable to tolerate it.  She was placed on 3 L of oxygen which improved her O2 saturation to 90%.  In the emergency department her blood  pressure was evaluated and read 230/150.  Respiratory rate is in the 40s and her heart rate was 100 bpm.  She was placed on noninvasive mechanical ventilation.  She denied chest pain, nausea, vomiting, diaphoresis and abdominal pain.  She denied urinary symptoms.  Her respiratory viral panel was negative her chest x-ray showed cardiomegaly with diffuse bilateral pulmonary interstitial edema.  EKG showed sinus rhythm with left atrial enlargement.   She received IV diuresis.  Her blood pressure remained high.  At the initiation of her home blood pressure medications blood pressure substantially improved.  Her hydralazine was increased to 100 mg 3 times daily, her metoprolol was transitioned to carvedilol.  Low-salt diet was discussed, volume and fluid restriction.  She admitted to dietary indiscretion eating fast food fairly frequently which was felt to be a major contributing factor to her heart failure exacerbation.  It was recommended that she have home health PT due to her ambulatory dysfunction and deconditioning.   She presented to the clinic 02/07/2020 for follow-up evaluation and stated she felt fairly well.  She stated she did have some neuropathy in her hands and her feet from cancer treatment.  She had been taking gabapentin which she did not like because it made her tired.  She had been trying her best to stay away from higher sodium food options.  We reviewed daily weights and I expressed that I would like her to call the office with a weight increase of 3 pounds overnight or 5 pounds in a week.  She expressed understanding.  I will gave her the salty 6 diet sheet, asked her to increase her physical activity as tolerated, and follow-up in 3 months.   She presented to the clinic 05/03/20 for follow-up evaluation and stated she felt well.  She continued to notice neuropathy pain in her hands and feet.  She had not been taking her gabapentin but was thinking about resuming it in the evenings.  She  reported that she started cancer treatment again at the beginning of January and had 3 infusions.  On her follow-up scan she reported spots were noted on her liver.  She would have a repeat scan in March or April.  She also reported that her oncologist was monitoring her hemoglobin.  Since she had been back on her infusions she  noted to be more anemic.  They were watching this closely.  Her blood pressures were well controlled at home.  She reported that  she did not take her amlodipine or carvedilol before her appointment.  We discussed medication compliance.  She reported that she was staying physically active and walking most days of the week.  I asked her to maintain a blood pressure log, continue her low-salt diet, and follow-up  with Dr. Sallyanne Kuster in 6 months.  She was seen by Dr. Sallyanne Kuster 09/20/2020.  During that time she continued her chemotherapy for biliary cancer.  Her weight stabilized around 126-129 pounds.  She denied chest discomfort.  She denied orthopnea and PND.  She was able to do household chores with no shortness of breath.  She denied palpitations.  She did note focal neurological changes related to her chemotherapy and neuropathy in her fingers and feet.  It was recommended that her hemoglobin not drop below 8.  It was felt that she would not be a good candidate for ischemic evaluation due to her comorbidities.  Follow-up was planned for 12 months.  She presented to the clinic 03/28/2021 for follow-up evaluation stated she continued to take cancer treatment  daily.  She reported that her tumor had been stable in size.  She was limited in her physical activity due to her neuropathy.  She also reported that her food lacks taste.  We reviewed the importance of using seasonings other than salt for her food.  She expressed understanding.  Her weight had remained fairly stable.  Her blood pressure was well controlled.  I gave her the salty 6 diet sheet, had her increase her physical activity as  tolerated, refilled amlodipine, furosemide, potassium, and check her lipids and LFTs.  We planned follow-up for 6 months.  She presented to the clinic 07/29/2021 for follow-up evaluation and stated she had felt gradually more fatigued over the last several months.  She reported that she did not take any of her medications that morning.  She continued to receive cancer treatment.  She also noted increased shortness of breath with increased physical activity.  She had not had any energy to do regular physical exercise.  She continued to report that her food did not taste well.  She had repeat PET scan for oncology 07/24/2021.  She had follow-up planned with oncology the next week for review of her results.  I  ordered an echocardiogram,  reviewed the salty 6 diet sheet, asked her to increase her physical activity as tolerated,  order CBC and planned follow-up in 1 to 2 months.  Her CBC was stable.  She presents to the clinic today for follow-up evaluation states***  Today she denies chest pain, shortness of breath, lower extremity edema, fatigue, palpitations, melena, hematuria, hemoptysis, diaphoresis, weakness, presyncope, syncope, orthopnea, and PND.  Home Medications    Prior to Admission medications   Medication Sig Start Date End Date Taking? Authorizing Provider  amLODipine (NORVASC) 10 MG tablet Take 1 tablet (10 mg total) by mouth daily. 10/22/19   Danford, Suann Larry, MD  aspirin 81 MG tablet Take 81 mg by mouth daily.     [provider]  carvedilol (COREG) 25 MG tablet Take 1 tablet (25 mg total) by mouth 2 (two) times daily with a meal. 02/15/20   [name], Mihai, MD  furosemide (LASIX) 40 MG tablet Take 1 tablet (40 mg total) by mouth daily. 02/22/20   [name], Mihai, MD  glucose blood test strip 1 each by Other route 2 (two) times daily. Use Onetouch verio test strips as instructed to check blood sugar twice daily. 01/05/19   Elayne Snare, MD  hydrALAZINE (APRESOLINE) 100 MG  tablet Take 1 tablet (100 mg total) by mouth every 8 (eight) hours. 02/15/20   [name], Mihai, MD  KLOR-CON M10 10 MEQ tablet TAKE 1 TABLET BY MOUTH EVERY DAY 03/04/20   [name], [name], MD  lansoprazole (PREVACID) 15  MG capsule Take 1 capsule (15 mg total) by mouth daily. 06/08/19   [name], [name], MD  loratadine (CLARITIN) 10 MG tablet Take 10 mg by mouth daily as needed for allergies.    [provider]  Omega 3 1000 MG CAPS Take 2,000 mg by mouth daily.     [provider]  rosuvastatin (CRESTOR) 20 MG tablet Take 1 tablet (20 mg total) by mouth daily. 02/16/20 05/16/20  Deberah Pelton, NP  sitaGLIPtin (JANUVIA) 100 MG tablet Take 1 tablet (100 mg total) by mouth daily. 04/08/20   [name], MD  valsartan (DIOVAN) 320 MG tablet Take 1 tablet (320 mg total) by mouth daily. 02/07/20   Deberah Pelton, NP    Family History    Family History  Problem Relation Age of Onset   Breast cancer Cousin        diagnosed >50; mother's first cousins   Diabetes Brother    Heart disease Brother    Heart disease Mother    Hypertension Mother    Multiple sclerosis Mother    Heart disease Father    Hypertension Father    Stroke Father    Prostate cancer Brother 26   Heart attack Maternal Grandmother    Stroke Maternal Grandfather    Colon cancer Neg Hx    Esophageal cancer Neg Hx    Stomach cancer Neg Hx    Rectal cancer Neg Hx    She indicated that her mother is deceased. She indicated that her father is deceased. She indicated that only one of her two brothers is alive. She indicated that her maternal grandmother is deceased. She indicated that her maternal grandfather is deceased. She indicated that her paternal grandmother is deceased. She indicated that her paternal grandfather is deceased. She indicated that her maternal uncle is deceased. She indicated that her paternal aunt is deceased. She indicated that her paternal uncle is deceased. She indicated that the  status of her cousin is unknown. She indicated that the status of her neg hx is unknown.  Social History    Social History   Socioeconomic History   Marital status: Single    Spouse name: Not on file   Number of children: 2   Years of education: 12   Highest education level: Not on file  Occupational History   Occupation: retired    Comment: disabled  Tobacco Use   Smoking status: Former    Packs/day: 0.25    Years: 25.00    Total pack years: 6.25    Types: Cigarettes    Quit date: 1990    Years since quitting: 33.4   Smokeless tobacco: Never  Vaping Use   Vaping Use: Never used  Substance and Sexual Activity   Alcohol use: Not Currently    Alcohol/week: 0.0 standard drinks of alcohol   Drug use: Never   Sexual activity: Yes    Partners: Male    Birth control/protection: Condom, Post-menopausal    Comment: First sexual encounter age 83. Fewer than 5 partners in life time.  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   Has had 3 Covid vaccines   Social Determinants of Health   Financial Resource Strain: Low  Risk  (04/11/2020)   Overall Financial Resource Strain (CARDIA)    Difficulty of Paying Living Expenses: Not hard at all  Food Insecurity: No Food Insecurity (04/11/2020)   Hunger Vital Sign    Worried About Running Out of Food in the Last Year: Never true    Ran Out of Food in the Last Year: Never true  Transportation Needs: No Transportation Needs (04/11/2020)   PRAPARE - Hydrologist (Medical): No    Lack of Transportation (Non-Medical): No  Physical Activity: Inactive (04/11/2020)   Exercise Vital Sign    Days of Exercise per Week: 0 days    Minutes of Exercise per Session: 0 min  Stress: No Stress  Concern Present (04/11/2020)   [place]    Feeling of Stress : Not at all  Social Connections: Moderately Isolated (04/11/2020)   Social Connection and Isolation Panel [NHANES]    Frequency of Communication with Friends and Family: More than three times a week    Frequency of Social Gatherings with Friends and Family: More than three times a week    Attends Religious Services: More than 4 times per year    Active Member of [employer] or Organizations: No    Attends Archivist Meetings: Never    Marital Status: Widowed  Intimate Partner Violence: Not At Risk (04/11/2020)   Humiliation, Afraid, Rape, and Kick questionnaire    Fear of Current or Ex-Partner: No    Emotionally Abused: No    Physically Abused: No    Sexually Abused: No     Review of Systems    General:  No chills, fever, night sweats or weight changes.  Cardiovascular:  No chest pain, dyspnea on exertion, edema, orthopnea, palpitations, paroxysmal nocturnal dyspnea. Dermatological: No rash, lesions/masses Respiratory: No cough, dyspnea Urologic: No hematuria, dysuria Abdominal:   No nausea, vomiting, diarrhea, bright red blood per rectum, melena, or hematemesis Neurologic:  No visual changes, wkns, changes in mental status.  Reports numbness  in hands and bilateral feet All other systems reviewed and are otherwise negative except as noted above.  Physical Exam    VS:  LMP  (LMP Unknown)  , BMI There is no height or weight on file to calculate BMI. GEN: Well nourished, well developed, in no acute distress. HEENT: normal. Neck: Supple, no JVD, carotid bruits, or masses. Cardiac: RRR, no murmurs, rubs, or gallops. No clubbing, cyanosis, edema.  Radials/DP/PT 2+ and equal bilaterally.  Respiratory:  Respirations regular and unlabored, clear to auscultation bilaterally. GI: Soft, nontender, nondistended, BS + x 4. MS: no deformity or atrophy. Skin:  warm and dry, no rash. Neuro:  Strength and sensation are intact. Psych: Normal affect.  Accessory Clinical Findings    Recent Labs: 10/02/2020: TSH 2.065 08/22/2021: ALT 14; BUN 49; Creatinine 1.45; Hemoglobin 10.8; Platelet Count 192; Potassium 4.3; Sodium 139   Recent Lipid Panel    Component Value Date/Time   CHOL 231 (H) 03/28/2021 0842   TRIG 68 03/28/2021 0842   HDL 72 03/28/2021 0842   CHOLHDL 3.2 03/28/2021 0842   CHOLHDL 3 09/01/2018 1435   VLDL 9.2 09/01/2018 1435   LDLCALC 147 (H) 03/28/2021 0842    ECG personally reviewed by me today- none today.  Echocardiogram 01/03/2020 IMPRESSIONS     1. Left ventricular ejection fraction, by estimation, is 60 to 65%. The  left ventricle has normal function. The left ventricle has no regional  wall  motion abnormalities. There is moderate concentric left ventricular  hypertrophy. Left ventricular  diastolic parameters are consistent with Grade III diastolic dysfunction  (restrictive).   2. Right ventricular systolic function is normal. The right ventricular  size is normal. There is severely elevated pulmonary artery systolic  pressure.   3. Left atrial size was severely dilated.   4. Right atrial size was moderately dilated.   5. A small pericardial effusion is present. The pericardial effusion is  circumferential.   6. The mitral valve is normal in structure. Mild mitral valve  regurgitation.   7. Tricuspid valve regurgitation is moderate.   8. The aortic valve is tricuspid. Aortic valve regurgitation is not  visualized. No aortic stenosis is present.   9. The inferior vena cava is dilated in size with >50% respiratory  variability, suggesting right atrial pressure of 8 mmHg.   Comparison(s): No prior Echocardiogram.   Assessment & Plan   1.  Chronic diastolic CHF-continues to be euvolemic.  NYHA class II-3.Marland Kitchen    Weight today 132***. Echocardiogram 01/03/2020 showed an EF of 60-65%, G3 DD.   Echocardiogram***.  CBC  stable on recheck. Continue carvedilol Heart healthy low-sodium diet-salty 6 reviewed Increase physical activity as tolerated Continue Daily weights Repeat echocardiogram   Hypertensive -BP today ***136/58.    Did not take medication this morning. Continue amlodipine, hydralazine, irbesartan, carvedilol Heart healthy low-sodium diet-salty 6 given Increase physical activity as tolerated Continue Bp- log    Hyperlipidemia- 03/28/2021: Cholesterol, Total 231; HDL 72; LDL Chol Calc (NIH) 147; Triglycerides 68 Heart healthy low-sodium high-fiber diet Increase physical activity as tolerated Order ezetimibe 10 mg daily Repeat fasting lipids and LFTs   Metastatic cholangiocarcinoma-continues Tibsovo  treatment.  Not felt to be a candidate for resection. Follows with oncology   Disposition: Follow-up with Dr. Sallyanne Kuster in 3-4 months.   Jossie Ng. Imaya Duffy NP-C    08/25/2021, 7:21 AM [address] Office [phone] Fax [phone]  Notice: This dictation was prepared with Dragon dictation along with smaller phrase technology. Any transcriptional errors that result from this process are unintentional and may not be corrected upon review.  I spent 14 minutes examining this patient, reviewing medications, and using patient centered shared decision making involving her cardiac care.  Prior to her visit I spent greater than 20 minutes reviewing her past medical history,  medications, and prior cardiac tests.

## 2021-08-26 ENCOUNTER — Other Ambulatory Visit: Payer: Self-pay

## 2021-08-26 ENCOUNTER — Telehealth: Payer: Self-pay

## 2021-08-26 ENCOUNTER — Encounter: Payer: Self-pay | Admitting: Oncology

## 2021-08-26 DIAGNOSIS — E860 Dehydration: Secondary | ICD-10-CM | POA: Insufficient documentation

## 2021-08-26 NOTE — Telephone Encounter (Signed)
Spoke with pt via telephone regarding hydration and to assess how she's feeling today.  Pt stated she's eating a little more but sometimes she does not have an appetite.  Pt stated her neuropathy is still severe and also asked if Dr. Burr Medico could prescribe her the Tibsovo again.  Informed pt that this RN will make Dr. Burr Medico aware of her request but pt is also scheduled to see Dr. Burr Medico in clinic next week.  Called pt to inform pt that she has a hydration appt scheduled in [facility] tomorrow 08/27/2021 at 12:30 pm.  Pt confirmed appt.

## 2021-08-27 ENCOUNTER — Encounter: Payer: Self-pay | Admitting: General Practice

## 2021-08-27 ENCOUNTER — Inpatient Hospital Stay: Payer: Medicare Other

## 2021-08-27 ENCOUNTER — Ambulatory Visit (INDEPENDENT_AMBULATORY_CARE_PROVIDER_SITE_OTHER): Payer: Medicare Other | Admitting: General Practice

## 2021-08-27 ENCOUNTER — Other Ambulatory Visit: Payer: Self-pay

## 2021-08-27 ENCOUNTER — Ambulatory Visit: Payer: Medicare Other | Admitting: General Practice

## 2021-08-27 VITALS — BP 124/60 | HR 68 | Ht 65.5 in | Wt 132.0 lb

## 2021-08-27 VITALS — BP 144/60 | HR 62 | Temp 98.2°F | Resp 16

## 2021-08-27 DIAGNOSIS — I890 Lymphedema, not elsewhere classified: Secondary | ICD-10-CM | POA: Diagnosis not present

## 2021-08-27 DIAGNOSIS — C221 Intrahepatic bile duct carcinoma: Secondary | ICD-10-CM

## 2021-08-27 DIAGNOSIS — Z95828 Presence of other vascular implants and grafts: Secondary | ICD-10-CM

## 2021-08-27 DIAGNOSIS — E860 Dehydration: Secondary | ICD-10-CM

## 2021-08-27 DIAGNOSIS — G629 Polyneuropathy, unspecified: Secondary | ICD-10-CM | POA: Diagnosis not present

## 2021-08-27 DIAGNOSIS — E7800 Pure hypercholesterolemia, unspecified: Secondary | ICD-10-CM | POA: Diagnosis not present

## 2021-08-27 DIAGNOSIS — C787 Secondary malignant neoplasm of liver and intrahepatic bile duct: Secondary | ICD-10-CM | POA: Diagnosis not present

## 2021-08-27 DIAGNOSIS — I10 Essential (primary) hypertension: Secondary | ICD-10-CM

## 2021-08-27 DIAGNOSIS — N183 Chronic kidney disease, stage 3 unspecified: Secondary | ICD-10-CM | POA: Diagnosis not present

## 2021-08-27 DIAGNOSIS — C786 Secondary malignant neoplasm of retroperitoneum and peritoneum: Secondary | ICD-10-CM | POA: Diagnosis not present

## 2021-08-27 DIAGNOSIS — I5032 Chronic diastolic (congestive) heart failure: Secondary | ICD-10-CM

## 2021-08-27 DIAGNOSIS — Z79899 Other long term (current) drug therapy: Secondary | ICD-10-CM | POA: Diagnosis not present

## 2021-08-27 MED ORDER — SODIUM CHLORIDE 0.9 % IV SOLN: Freq: Once | INTRAVENOUS | Status: AC

## 2021-08-27 MED ORDER — SODIUM CHLORIDE 0.9% FLUSH
10.0000 mL | Freq: Once | INTRAVENOUS | Status: AC
  Administered 2021-08-27: 10 mL

## 2021-08-27 MED ORDER — HEPARIN SOD (PORK) LOCK FLUSH 100 UNIT/ML IV SOLN
500.0000 [IU] | Freq: Once | INTRAVENOUS | Status: AC
  Administered 2021-08-27: 500 [IU]

## 2021-08-27 NOTE — Patient Instructions (Signed)
Rehydration, Adult Rehydration is the replacement of body fluids, salts, and minerals (electrolytes) that are lost during dehydration. Dehydration is when there is not enough water or other fluids in the body. This happens when you lose more fluids than you take in. Common causes of dehydration include: Not drinking enough fluids. This can occur when you are ill or doing activities that require a lot of energy, especially in hot weather. Conditions that cause loss of water or other fluids, such as diarrhea, vomiting, sweating, or urinating a lot. Other illnesses, such as fever or infection. Certain medicines, such as those that remove excess fluid from the body (diuretics). Symptoms of mild or moderate dehydration may include thirst, dry lips and mouth, and dizziness. Symptoms of severe dehydration may include increased heart rate, confusion, fainting, and not urinating. For severe dehydration, you may need to get fluids through an IV at the hospital. For mild or moderate dehydration, you can usually rehydrate at home by drinking certain fluids as told by your health care provider. What are the risks? Generally, rehydration is safe. However, taking in too much fluid (overhydration) can be a problem. This is rare. Overhydration can cause an electrolyte imbalance, kidney failure, or a decrease in salt (sodium) levels in the body. Supplies needed You will need an oral rehydration solution (ORS) if your health care provider tells you to use one. This is a drink to treat dehydration. It can be found in pharmacies and retail stores. How to rehydrate Fluids Follow instructions from your health care provider for rehydration. The kind of fluid and the amount you should drink depend on your condition. In general, you should choose drinks that you prefer. If told by your health care provider, drink an ORS. Make an ORS by following instructions on the package. Start by drinking small amounts, about  cup (120  mL) every 5-10 minutes. Slowly increase how much you drink until you have taken the amount recommended by your health care provider. Drink enough clear fluids to keep your urine pale yellow. If you were told to drink an ORS, finish it first, then start slowly drinking other clear fluids. Drink fluids such as: Water. This includes sparkling water and flavored water. Drinking only water can lead to having too little sodium in your body (hyponatremia). Follow the advice of your health care provider. Water from ice chips you suck on. Fruit juice with water you add to it (diluted). Sports drinks. Hot or cold herbal teas. Broth-based soups. Milk or milk products. Food Follow instructions from your health care provider about what to eat while you rehydrate. Your health care provider may recommend that you slowly begin eating regular foods in small amounts. Eat foods that contain a healthy balance of electrolytes, such as bananas, oranges, potatoes, tomatoes, and spinach. Avoid foods that are greasy or contain a lot of sugar. In some cases, you may get nutrition through a feeding tube that is passed through your nose and into your stomach (nasogastric tube, or NG tube). This may be done if you have uncontrolled vomiting or diarrhea. Beverages to avoid  Certain beverages may make dehydration worse. While you rehydrate, avoid drinking alcohol. How to tell if you are recovering from dehydration You may be recovering from dehydration if: You are urinating more often than before you started rehydrating. Your urine is pale yellow. Your energy level improves. You vomit less frequently. You have diarrhea less frequently. Your appetite improves or returns to normal. You feel less dizzy or less light-headed.   Your skin tone and color start to look more normal. Follow these instructions at home: Take over-the-counter and prescription medicines only as told by your health care provider. Do not take sodium  tablets. Doing this can lead to having too much sodium in your body (hypernatremia). Contact a health care provider if: You continue to have symptoms of mild or moderate dehydration, such as: Thirst. Dry lips. Slightly dry mouth. Dizziness. Dark urine or less urine than normal. Muscle cramps. You continue to vomit or have diarrhea. Get help right away if you: Have symptoms of dehydration that get worse. Have a fever. Have a severe headache. Have been vomiting and the following happens: Your vomiting gets worse or does not go away. Your vomit includes blood or green matter (bile). You cannot eat or drink without vomiting. Have problems with urination or bowel movements, such as: Diarrhea that gets worse or does not go away. Blood in your stool (feces). This may cause stool to look black and tarry. Not urinating, or urinating only a small amount of very dark urine, within 6-8 hours. Have trouble breathing. Have symptoms that get worse with treatment. These symptoms may represent a serious problem that is an emergency. Do not wait to see if the symptoms will go away. Get medical help right away. Call your local emergency services (911 in the [place] Do not drive yourself to the hospital. Summary Rehydration is the replacement of body fluids and minerals (electrolytes) that are lost during dehydration. Follow instructions from your health care provider for rehydration. The kind of fluid and amount you should drink depend on your condition. Slowly increase how much you drink until you have taken the amount recommended by your health care provider. Contact your health care provider if you continue to show signs of mild or moderate dehydration. This information is not intended to replace advice given to you by your health care provider. Make sure you discuss any questions you have with your health care provider. Document Revised: 05/03/2019 Document Reviewed: 03/13/2019 Elsevier Patient  Education  2023 Elsevier Inc.  

## 2021-08-27 NOTE — Patient Instructions (Signed)
Medication Instructions:  The current medical regimen is effective;  continue present plan and medications as directed. Please refer to the Current Medication list given to you today.  *If you need a refill on your cardiac medications before your next appointment, please call your pharmacy*  Lab Work:   Testing/Procedures:  FASTING LIPID&LFT  NONE If you have labs (blood work) drawn today and your tests are completely normal, you will receive your results only by: [place] (if you have MyChart) OR  A paper copy in the mail If you have any lab test that is abnormal or we need to change your treatment, we will call you to review the results.  Special Instructions NONE  Follow-Up: Your next appointment:  4-5 month(s) In Person with Sanda Klein, MD   At [facility], you and your health needs are our priority.  As part of our continuing mission to provide you with exceptional heart care, we have created designated Provider Care Teams.  These Care Teams include your primary Cardiologist (physician) and Advanced Practice Providers (APPs -  Physician Assistants and Nurse Practitioners) who all work together to provide you with the care you need, when you need it.  We recommend signing up for the patient portal called "MyChart".  Sign up information is provided on this After Visit Summary.  MyChart is used to connect with patients for Virtual Visits (Telemedicine).  Patients are able to view lab/test results, encounter notes, upcoming appointments, etc.  Non-urgent messages can be sent to your provider as well.   To learn more about what you can do with MyChart, go to [email or web address].      Important Information About Sugar

## 2021-08-29 ENCOUNTER — Ambulatory Visit (INDEPENDENT_AMBULATORY_CARE_PROVIDER_SITE_OTHER): Payer: Medicare Other

## 2021-08-29 ENCOUNTER — Encounter: Payer: Self-pay | Admitting: General Practice

## 2021-08-29 VITALS — Ht 65.5 in | Wt 132.0 lb

## 2021-08-29 DIAGNOSIS — Z0000 Encounter for general adult medical examination without abnormal findings: Secondary | ICD-10-CM

## 2021-08-29 NOTE — Patient Instructions (Addendum)
Donna May , Thank you for taking time to come for your Medicare Wellness Visit. I appreciate your ongoing commitment to your health goals. Please review the following plan we discussed and let me know if I can assist you in the future.   These are the goals we discussed:  Goals       patient      Take a trip; slow down work Can take a trip; bus trip; Sr Citizen trip [employer] and gets vacation trips planner        Patient Stated      Will quit working!      Patient Stated      When you feeling down;  Go shopping and not buy anything. Visit the Senior center;  [facility] [place] a trip; give them a call       Patient Stated      Retire fully!       Patient stated (pt-stated)      I would like to eat better        This is a list of the screening recommended for you and due dates:  Health Maintenance  Topic Date Due   Eye exam for diabetics  04/19/2021   COVID-19 Vaccine (3 - Moderna risk series) 09/14/2021*   Zoster (Shingles) Vaccine (1 of 2) 11/29/2021*   Tetanus Vaccine  08/30/2022*   Mammogram  09/24/2021   Flu Shot  10/14/2021   Complete foot exam   11/22/2021   Hemoglobin A1C  12/25/2021   Pneumonia Vaccine  Completed   DEXA scan (bone density measurement)  Completed   HPV Vaccine  Aged Out  *Topic was postponed. The date shown is not the original due date.    Advanced directives: Yes  Conditions/risks identified: None  Next appointment: Follow up in one year for your annual wellness visit     Preventive Care 65 Years and Older, Female Preventive care refers to lifestyle choices and visits with your health care provider that can promote health and wellness. What does preventive care include? A yearly physical exam. This is also called an annual well check. Dental exams once or twice a year. Routine eye exams. Ask your health care provider how often you should have your eyes checked. Personal lifestyle choices,  including: Daily care of your teeth and gums. Regular physical activity. Eating a healthy diet. Avoiding tobacco and drug use. Limiting alcohol use. Practicing safe sex. Taking low-dose aspirin every day. Taking vitamin and mineral supplements as recommended by your health care provider. What happens during an annual well check? The services and screenings done by your health care provider during your annual well check will depend on your age, overall health, lifestyle risk factors, and family history of disease. Counseling  Your health care provider may ask you questions about your: Alcohol use. Tobacco use. Drug use. Emotional well-being. Home and relationship well-being. Sexual activity. Eating habits. History of falls. Memory and ability to understand (cognition). Work and work Statistician. Reproductive health. Screening  You may have the following tests or measurements: Height, weight, and BMI. Blood pressure. Lipid and cholesterol levels. These may be checked every 5 years, or more frequently if you are over 54 years old. Skin check. Lung cancer screening. You may have this screening every year starting at age 66 if you have a 30-pack-year history of smoking and currently smoke or have quit within the past 15 years. Fecal occult blood test (FOBT) of the stool.  You may have this test every year starting at age 16. Flexible sigmoidoscopy or colonoscopy. You may have a sigmoidoscopy every 5 years or a colonoscopy every 10 years starting at age 73. Hepatitis C blood test. Hepatitis B blood test. Sexually transmitted disease (STD) testing. Diabetes screening. This is done by checking your blood sugar (glucose) after you have not eaten for a while (fasting). You may have this done every 1-3 years. Bone density scan. This is done to screen for osteoporosis. You may have this done starting at age 58. Mammogram. This may be done every 1-2 years. Talk to your health care provider  about how often you should have regular mammograms. Talk with your health care provider about your test results, treatment options, and if necessary, the need for more tests. Vaccines  Your health care provider may recommend certain vaccines, such as: Influenza vaccine. This is recommended every year. Tetanus, diphtheria, and acellular pertussis (Tdap, Td) vaccine. You may need a Td booster every 10 years. Zoster vaccine. You may need this after age 50. Pneumococcal 13-valent conjugate (PCV13) vaccine. One dose is recommended after age 30. Pneumococcal polysaccharide (PPSV23) vaccine. One dose is recommended after age 7. Talk to your health care provider about which screenings and vaccines you need and how often you need them. This information is not intended to replace advice given to you by your health care provider. Make sure you discuss any questions you have with your health care provider. Document Released: 03/29/2015 Document Revised: 11/20/2015 Document Reviewed: 01/01/2015 Elsevier Interactive Patient Education  2017 [employer] Prevention in the Home Falls can cause injuries. They can happen to people of all ages. There are many things you can do to make your home safe and to help prevent falls. What can I do on the outside of my home? Regularly fix the edges of walkways and driveways and fix any cracks. Remove anything that might make you trip as you walk through a door, such as a raised step or threshold. Trim any bushes or trees on the path to your home. Use bright outdoor lighting. Clear any walking paths of anything that might make someone trip, such as rocks or tools. Regularly check to see if handrails are loose or broken. Make sure that both sides of any steps have handrails. Any raised decks and porches should have guardrails on the edges. Have any leaves, snow, or ice cleared regularly. Use sand or salt on walking paths during winter. Clean up any spills in  your garage right away. This includes oil or grease spills. What can I do in the bathroom? Use night lights. Install grab bars by the toilet and in the tub and shower. Do not use towel bars as grab bars. Use non-skid mats or decals in the tub or shower. If you need to sit down in the shower, use a plastic, non-slip stool. Keep the floor dry. Clean up any water that spills on the floor as soon as it happens. Remove soap buildup in the tub or shower regularly. Attach bath mats securely with double-sided non-slip rug tape. Do not have throw rugs and other things on the floor that can make you trip. What can I do in the bedroom? Use night lights. Make sure that you have a light by your bed that is easy to reach. Do not use any sheets or blankets that are too big for your bed. They should not hang down onto the floor. Have a firm chair that has  side arms. You can use this for support while you get dressed. Do not have throw rugs and other things on the floor that can make you trip. What can I do in the kitchen? Clean up any spills right away. Avoid walking on wet floors. Keep items that you use a lot in easy-to-reach places. If you need to reach something above you, use a strong step stool that has a grab bar. Keep electrical cords out of the way. Do not use floor polish or wax that makes floors slippery. If you must use wax, use non-skid floor wax. Do not have throw rugs and other things on the floor that can make you trip. What can I do with my stairs? Do not leave any items on the stairs. Make sure that there are handrails on both sides of the stairs and use them. Fix handrails that are broken or loose. Make sure that handrails are as long as the stairways. Check any carpeting to make sure that it is firmly attached to the stairs. Fix any carpet that is loose or worn. Avoid having throw rugs at the top or bottom of the stairs. If you do have throw rugs, attach them to the floor with carpet  tape. Make sure that you have a light switch at the top of the stairs and the bottom of the stairs. If you do not have them, ask someone to add them for you. What else can I do to help prevent falls? Wear shoes that: Do not have high heels. Have rubber bottoms. Are comfortable and fit you well. Are closed at the toe. Do not wear sandals. If you use a stepladder: Make sure that it is fully opened. Do not climb a closed stepladder. Make sure that both sides of the stepladder are locked into place. Ask someone to hold it for you, if possible. Clearly mark and make sure that you can see: Any grab bars or handrails. First and last steps. Where the edge of each step is. Use tools that help you move around (mobility aids) if they are needed. These include: Canes. Walkers. Scooters. Crutches. Turn on the lights when you go into a dark area. Replace any light bulbs as soon as they burn out. Set up your furniture so you have a clear path. Avoid moving your furniture around. If any of your floors are uneven, fix them. If there are any pets around you, be aware of where they are. Review your medicines with your doctor. Some medicines can make you feel dizzy. This can increase your chance of falling. Ask your doctor what other things that you can do to help prevent falls. This information is not intended to replace advice given to you by your health care provider. Make sure you discuss any questions you have with your health care provider. Document Released: 12/27/2008 Document Revised: 08/08/2015 Document Reviewed: 04/06/2014 Elsevier Interactive Patient Education  2017 [employer].

## 2021-08-29 NOTE — Progress Notes (Signed)
Donna May  Referred by nursing for spiritual and emotional support. Reached Donna May by phone, learning that her two primary barriers to quality of life are neuropathy and sleep disturbance. She has trouble with ADLs including dressing, cooking, and walking because of lack of sensation. This is particularly troublesome to her because she is so used to being independent, which in turn affects her outlook and mood. She also finds herself needing naps during the day because she has trouble sleeping at night.   Donna May reports good support from family (daughter, a Company secretary in [place]; son in [place]; brother and his wife in [place]; and cousin). Prayer, faith, and church friends provide additional support.  We plan to follow up by phone in ca two weeks. Will also refer to social work to investigate eligibility for Meals on Wheels or similar service per her request.   Pauline Good, [facility] Pager [phone] Voicemail [phone]

## 2021-08-29 NOTE — Progress Notes (Signed)
Subjective:   Donna May is a 81 y.o. female who presents for Medicare Annual (Subsequent) preventive examination.  Review of Systems    Virtual Visit via Telephone Note  I connected with  Donna May on 08/29/21 at 11:15 AM EDT by telephone and verified that I am speaking with the correct person using two identifiers.  Location: Patient: Home Provider: Office Persons participating in the virtual visit: patient/Nurse Health Advisor   I discussed the limitations, risks, security and privacy concerns of performing an evaluation and management service by telephone and the availability of in person appointments. The patient expressed understanding and agreed to proceed.  Interactive audio and video telecommunications were attempted between this nurse and patient, however failed, due to patient having technical difficulties OR patient did not have access to video capability.  We continued and completed visit with audio only.  Some vital signs may be absent or patient reported.   Criselda Peaches, LPN  Cardiac Risk Factors include: advanced age (>56mn, >>24women);hypertension;diabetes mellitus     Objective:    Today's Vitals   08/29/21 1127  Weight: 132 lb (59.9 kg)  Height: 5' 5.5" (1.664 m)   Body mass index is 21.63 kg/m.     08/29/2021   11:51 AM 05/02/2021    7:40 AM 04/18/2021    8:34 AM 11/27/2020   10:20 AM 11/13/2020    9:35 AM 10/02/2020   10:03 AM 08/08/2020   11:14 AM  Advanced Directives  Does Patient Have a Medical Advance Directive? Yes No No Yes Yes Yes Yes  Type of [place] will   Living will Living will Living will Living will  Does patient want to make changes to medical advance directive? No - Patient declined      No - Patient declined  Copy of [place] Chart? Yes - validated most recent copy scanned in chart (See row information)   No - copy requested No - copy requested No - copy  requested   Would patient like information on creating a medical advance directive?  No - Patient declined No - Patient declined No - Patient declined  No - Patient declined     Current Medications (verified) Outpatient Encounter Medications as of 08/29/2021  Medication Sig   amLODipine (NORVASC) 10 MG tablet Take 1 tablet (10 mg total) by mouth daily.   aspirin 81 MG tablet Take 81 mg by mouth daily.    Baclofen 5 MG TABS TAKE 1/2 TABLET BY MOUTH ONCE DAILY AS NEEDED   blood glucose meter kit and supplies KIT Dispense based on patient and insurance preference. Use up to four times daily as directed.   carvedilol (COREG) 25 MG tablet Take 1 tablet (25 mg total) by mouth 2 (two) times daily with a meal.   ezetimibe (ZETIA) 10 MG tablet Take 1 tablet (10 mg total) by mouth daily.   furosemide (LASIX) 40 MG tablet TAKE 1 TABLET BY MOUTH EVERY DAY   gabapentin (NEURONTIN) 100 MG capsule Take 1 capsule (100 mg total) by mouth every 8 (eight) hours as needed.   glucose blood (ONETOUCH VERIO) test strip Use up to 3 times daily to check blood sugar.   hydrALAZINE (APRESOLINE) 100 MG tablet TAKE THREE TIMES DAILY-AM, MIDDLE OF THE DAY AND IN THE EVENING.   KLOR-CON M10 10 MEQ tablet TAKE 1 TABLET BY MOUTH EVERY DAY   lansoprazole (PREVACID) 15 MG capsule Take 1 capsule (15  mg total) by mouth daily at 12 noon. Take 1 capsule by mouth once or twice a day as needed   lidocaine-prilocaine (EMLA) cream Apply 1 application topically as needed.   loratadine (CLARITIN) 10 MG tablet Take 10 mg by mouth daily as needed for allergies.   Omega 3 1000 MG CAPS Take 2,000 mg by mouth daily.    polyethylene glycol (MIRALAX / GLYCOLAX) 17 g packet Take 17 g by mouth daily as needed.   prochlorperazine (COMPAZINE) 10 MG tablet Take 1 tablet (10 mg total) by mouth every 6 (six) hours as needed for nausea or vomiting.   sitaGLIPtin (JANUVIA) 50 MG tablet Take 1 tablet (50 mg total) by mouth daily.   valsartan (DIOVAN)  320 MG tablet Take 1 tablet (320 mg total) by mouth daily.   Facility-Administered Encounter Medications as of 08/29/2021  Medication   triamcinolone acetonide (KENALOG) 10 MG/ML injection 10 mg    Allergies (verified) Patient has no known allergies.   History: Past Medical History:  Diagnosis Date   Anemia    Anxiety    Arthritis    Breast cancer ([facility]) 1992   right   Cataract    Bilateral eyes - surgery to remove   Chronic diastolic CHF (congestive heart failure) (HCC)    Chronic kidney disease    CKD stage 3   Complication of anesthesia    "something they use make me itch for a couple of days."   Depression    Diabetes mellitus    type 2   Duodenitis 01/18/2002   Fainting spell    GERD (gastroesophageal reflux disease)    Heart murmur    never has caused any problems   Hiatal hernia 08/08/2008, 01/18/2002   History of pneumonia    x 2   Hyperlipidemia    Hypertension    Liver cancer ([place]) 08/2018   chemotherapy   Lymphedema 2017   Right arm   Peripheral neuropathy    Past Surgical History:  Procedure Laterality Date   AXILLARY SURGERY     cyst removal, right   COLONOSCOPY  09/23/2018   Dr. Havery Moros - polyps   EYE SURGERY Bilateral    cataracts to remove   IR 3D INDEPENDENT WKST  05/02/2021   IR ANGIOGRAM SELECTIVE EACH ADDITIONAL VESSEL  04/18/2021   IR ANGIOGRAM SELECTIVE EACH ADDITIONAL VESSEL  04/18/2021   IR ANGIOGRAM SELECTIVE EACH ADDITIONAL VESSEL  05/02/2021   IR ANGIOGRAM SELECTIVE EACH ADDITIONAL VESSEL  05/02/2021   IR ANGIOGRAM SELECTIVE EACH ADDITIONAL VESSEL  05/02/2021   IR ANGIOGRAM SELECTIVE EACH ADDITIONAL VESSEL  05/02/2021   IR ANGIOGRAM SELECTIVE EACH ADDITIONAL VESSEL  05/02/2021   IR ANGIOGRAM VISCERAL SELECTIVE  04/18/2021   IR ANGIOGRAM VISCERAL SELECTIVE  05/02/2021   IR EMBO TUMOR ORGAN ISCHEMIA INFARCT INC GUIDE ROADMAPPING  05/02/2021   IR RADIOLOGIST EVAL & MGMT  01/30/2021   IR RADIOLOGIST EVAL & MGMT  05/22/2021   IR RADIOLOGIST EVAL &  MGMT  08/08/2021   IR US GUIDE VASC ACCESS RIGHT  04/18/2021   IR US GUIDE VASC ACCESS RIGHT  05/02/2021   LAPAROSCOPY N/A 11/17/2018   Procedure: LAPAROSCOPY DIAGNOSTIC, INTRAOPERATIVE ULTRASOUND, PERITONEAL BIOPSIES;  Surgeon: [name], MD;  Location: [facility];  Service: General;  Laterality: N/A;  GENERAL AND EPIDURAL   LIVER BIOPSY  08/2018   Dr. Lindwood Coke   MASTECTOMY  1992   right, with flap   NM [place]  06/11/2009   Protocol:[place], post  stress EF58%, EKG negative for ischemia, low risk   PORTACATH PLACEMENT N/A 12/02/2018   Procedure: INSERTION PORT-A-CATH;  Surgeon: [name], MD;  Location: [facility];  Service: General;  Laterality: N/A;   RECONSTRUCTION BREAST W/ TRAM FLAP Right    TONSILLECTOMY  1958   TRANSTHORACIC ECHOCARDIOGRAM  12/24/2009   LVEF =>55%, normal study   UPPER GI ENDOSCOPY     Family History  Problem Relation Age of Onset   Breast cancer Cousin        diagnosed >50; mother's first cousins   Diabetes Brother    Heart disease Brother    Heart disease Mother    Hypertension Mother    Multiple sclerosis Mother    Heart disease Father    Hypertension Father    Stroke Father    Prostate cancer Brother 19   Heart attack Maternal Grandmother    Stroke Maternal Grandfather    Colon cancer Neg Hx    Esophageal cancer Neg Hx    Stomach cancer Neg Hx    Rectal cancer Neg Hx    Social History   Socioeconomic History   Marital status: Single    Spouse name: Not on file   Number of children: 2   Years of education: 12   Highest education level: Not on file  Occupational History   Occupation: retired    Comment: disabled  Tobacco Use   Smoking status: Former    Packs/day: 0.25    Years: 25.00    Total pack years: 6.25    Types: Cigarettes    Quit date: 1990    Years since quitting: 33.4   Smokeless tobacco: Never  Vaping Use   Vaping Use: Never used  Substance and Sexual Activity   Alcohol use: Not Currently    Alcohol/week: 0.0  standard drinks of alcohol   Drug use: Never   Sexual activity: Yes    Partners: Male    Birth control/protection: Condom, Post-menopausal    Comment: First sexual encounter age 62. Fewer than 5 partners in life time.  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   Has had 3 Covid vaccines   Social Determinants of Health   Financial Resource Strain: Low Risk  (08/29/2021)   Overall Financial Resource Strain (CARDIA)    Difficulty of Paying Living Expenses: Not hard at all  Food Insecurity: No Food Insecurity (08/29/2021)   Hunger Vital Sign    Worried About Running Out of Food in the Last Year: Never true    [place] in the Last Year: Never true  Transportation Needs: No Transportation Needs (08/29/2021)   PRAPARE - Hydrologist (Medical): No    Lack of Transportation (Non-Medical): No  Physical Activity: Inactive (08/29/2021)   Exercise Vital Sign    Days of Exercise per Week: 0 days    Minutes of Exercise per Session: 0 min  Stress: No Stress Concern Present (08/29/2021)   [place]    Feeling of Stress : Only a little  Social Connections: Moderately Integrated (08/29/2021)   Social Connection and Isolation Panel [NHANES]    Frequency of Communication with Friends and Family:  More than three times a week    Frequency of Social Gatherings with Friends and Family: More than three times a week    Attends Religious Services: More than 4 times per year    Active Member of [employer] or Organizations: Yes    Attends Music therapist: More than 4 times per year    Marital Status: Divorced     Clinical Intake: Virtual Visit via Telephone Note  I  connected with  Donna May on 08/29/21 at 11:15 AM EDT by telephone and verified that I am speaking with the correct person using two identifiers.  Location: Patient: Home  Provider: Office Persons participating in the virtual visit: patient/Nurse Health Advisor   I discussed the limitations, risks, security and privacy concerns of performing an evaluation and management service by telephone and the availability of in person appointments. The patient expressed understanding and agreed to proceed.  Interactive audio and video telecommunications were attempted between this nurse and patient, however failed, due to patient having technical difficulties OR patient did not have access to video capability.  We continued and completed visit with audio only.  Some vital signs may be absent or patient reported.   Criselda Peaches, LPN  Pre-visit preparation completed: NoNutrition Risk Assessment:  Has the patient had any N/V/D within the last 2 months?  No  Does the patient have any non-healing wounds?  No  Has the patient had any unintentional weight loss or weight gain?  No   Diabetes:  Is the patient diabetic?  Yes  If diabetic, was a CBG obtained today?  No  Did the patient bring in their glucometer from home?  No  How often do you monitor your CBG's?  Daily.   Financial Strains and Diabetes Management:  Are you having any financial strains with the device, your supplies or your medication? No .  Does the patient want to be seen by Chronic Care Management for management of their diabetes?  No  Would the patient like to be referred to a Nutritionist or for Diabetic Management?  No   Diabetic Exams:  Diabetic Eye Exam: Completed Yes. Overdue for diabetic eye exam. Pt has been advised about the importance in completing this exam. A referral has been placed today. Message sent to referral coordinator for scheduling purposes. Advised pt to expect a call from office referred to regarding  appt.  Diabetic Foot Exam: Completed Yes. Pt has been advised about the importance in completing this exam. Pt is scheduled for diabetic foot exam on Followed by PCP.    Pain : No/denies painDiabetic?  Yes   Activities of Daily Living    08/29/2021   11:42 AM  In your present state of health, do you have any difficulty performing the following activities:  Hearing? 0  Vision? 0  Difficulty concentrating or making decisions? 0  Walking or climbing stairs? 1  Comment Due to Neuropathy. Followed by Oncologist  Dressing or bathing? 1  Comment Due to Neuropathy. Followed by Oncologist  Preparing Food and eating ? N  Using the Toilet? N  In the past six months, have you accidently leaked urine? Y  Comment Wears breifs  Do you have problems with loss of bowel control? N  Managing your Medications? N  Managing your Finances? N  Housekeeping or managing your Housekeeping? N    Patient Care Team: [name], MD as PCP - General (Family Medicine) [name], [name], MD as PCP - Cardiology (Cardiology) [name],  [name], MD as Consulting Physician (Cardiology) [name], MD as Consulting Physician (Obstetrics and Gynecology) [name], [name], NP as Nurse Practitioner (Hematology and Oncology) [facility] [name], MD as Consulting Physician (Hematology) [name], [name], MD as Consulting Physician (Gastroenterology) [name], RN as Oncology Nurse Navigator [name], MD as Consulting Physician (General Surgery)  Indicate any recent Medical Services you may have received from other than [facility] providers in the past year (date may be approximate).     Assessment:   This is a routine wellness examination for [place].  Hearing/Vision screen Hearing Screening - Comments:: No difficulty hearing Vision Screening - Comments:: Wears reading glasses. Followed by Dr Katy Fitch  Dietary issues and exercise activities discussed: Exercise limited by:  neurologic condition(s) (Dx Neuropathy. Followed by Oncologist)   Goals Addressed               This Visit's Progress     Patient stated (pt-stated)        I would like to eat better      Depression Screen    08/29/2021   11:38 AM 04/11/2020   11:09 AM 11/09/2019    4:16 PM 09/14/2018    9:08 AM 05/16/2018   12:29 PM 05/04/2017   12:12 PM 04/26/2017   10:43 AM  PHQ 2/9 Scores  PHQ - 2 Score 0 0 0 6  0 0  PHQ- 9 Score    9     Exception Documentation     Other- indicate reason in comment box    Not completed     pt was more interested in talking about her diabetic routine care, and concern with needed labwork.       Fall Risk    08/29/2021   11:49 AM 04/11/2020   11:05 AM 11/10/2019    3:28 PM 09/14/2018    9:10 AM 05/16/2018   12:29 PM  Fall Risk   Falls in the past year? 0 1 0 0 0  Number falls in past yr: 0 1 0 0   Injury with Fall? 0 0 0 0   Risk for fall due to : No Fall Risks Impaired balance/gait;Medication side effect;History of fall(s);Impaired mobility Impaired balance/gait;Other (Comment)  Impaired mobility;Impaired vision;History of fall(s)  Risk for fall due to: Comment   Neuropathy    Follow up  Falls evaluation completed;Falls prevention discussed Falls evaluation completed Falls evaluation completed Education provided    FALL RISK PREVENTION PERTAINING TO THE HOME:  Any stairs in or around the home? Yes  If so, are there any without handrails? No  Home free of loose throw rugs in walkways, pet beds, electrical cords, etc? Yes  Adequate lighting in your home to reduce risk of falls? Yes   ASSISTIVE DEVICES UTILIZED TO PREVENT FALLS:  Life alert? No  Use of a cane, walker or w/c? Yes  Grab bars in the bathroom? No  Shower chair or bench in shower? Yes  Elevated toilet seat or a handicapped toilet? No   TIMED UP AND GO:  Was the test performed? No . Audio Visit  Cognitive Function:        08/29/2021   11:51 AM  6CIT Screen  What Year? 0 points   What month? 0 points  What time? 0 points  Count back from 20 0 points  Months in reverse 0 points  Repeat phrase 0 points  Total Score 0 points    Immunizations Immunization History  Administered Date(s) Administered  Fluad Quad(high Dose 65+) 12/05/2018, 12/20/2019, 01/08/2021   Influenza, High Dose Seasonal PF 01/12/2014, 02/04/2015, 02/11/2017   Influenza,inj,Quad PF,6+ Mos 12/27/2015   Influenza-Unspecified 12/14/2012   Moderna SARS-COV2 Booster Vaccination 03/27/2020   Moderna Sars-Covid-2 Vaccination 04/07/2019, 05/12/2019   Pneumococcal Conjugate-13 01/12/2014   Pneumococcal Polysaccharide-23 02/04/2015   Zoster, Live 05/02/2012    TDAP status: Due, Education has been provided regarding the importance of this vaccine. Advised may receive this vaccine at local pharmacy or Health Dept. Aware to provide a copy of the vaccination record if obtained from local pharmacy or Health Dept. Verbalized acceptance and understanding.  Flu Vaccine status: Completed at today's visit  Pneumococcal vaccine status: Up to date  Covid-19 vaccine status: Completed vaccines  Qualifies for Shingles Vaccine? Yes   Zostavax completed No   Shingrix Completed?: No.    Education has been provided regarding the importance of this vaccine. Patient has been advised to call insurance company to determine out of pocket expense if they have not yet received this vaccine. Advised may also receive vaccine at local pharmacy or Health Dept. Verbalized acceptance and understanding.  Screening Tests Health Maintenance  Topic Date Due   OPHTHALMOLOGY EXAM  04/19/2021   COVID-19 Vaccine (3 - Moderna risk series) 09/14/2021 (Originally 04/24/2020)   Zoster Vaccines- Shingrix (1 of 2) 11/29/2021 (Originally 01/31/1960)   TETANUS/TDAP  08/30/2022 (Originally 01/31/1960)   MAMMOGRAM  09/24/2021   INFLUENZA VACCINE  10/14/2021   FOOT EXAM  11/22/2021   HEMOGLOBIN A1C  12/25/2021   Pneumonia Vaccine 65+ Years  old  Completed   DEXA SCAN  Completed   HPV VACCINES  Aged Out    Health Maintenance  Health Maintenance Due  Topic Date Due   OPHTHALMOLOGY EXAM  04/19/2021    Colorectal cancer screening: No longer required.   Mammogram status: No longer required due to Age.  Bone Density status: Completed 10/02/20. Results reflect: Bone density results: OSTEOPENIA. Repeat every   years.  Lung Cancer Screening: (Low Dose CT Chest recommended if Age 38-80 years, 30 pack-year currently smoking OR have quit w/in 15years.) does not qualify.     Additional Screening:  Hepatitis C Screening: does not qualify; Completed   Vision Screening: Recommended annual ophthalmology exams for early detection of glaucoma and other disorders of the eye. Is the patient up to date with their annual eye exam?  Yes  Who is the provider or what is the name of the office in which the patient attends annual eye exams? Dr Katy Fitch If pt is not established with a provider, would they like to be referred to a provider to establish care? No .   Dental Screening: Recommended annual dental exams for proper oral hygiene  Community Resource Referral / Chronic Care Management:  CRR required this visit?  No   CCM required this visit?  No      Plan:     I have personally reviewed and noted the following in the patient's chart:   Medical and social history Use of alcohol, tobacco or illicit drugs  Current medications and supplements including opioid prescriptions.  Functional ability and status Nutritional status Physical activity Advanced directives List of other physicians Hospitalizations, surgeries, and ER visits in previous 12 months Vitals Screenings to include cognitive, depression, and falls Referrals and appointments  In addition, I have reviewed and discussed with patient certain preventive protocols, quality metrics, and best practice recommendations. A written personalized care plan for preventive  services as well as general preventive health  recommendations were provided to patient.     Criselda Peaches, LPN   09/14/7791   Nurse Notes: None

## 2021-09-01 ENCOUNTER — Encounter: Payer: Self-pay | Admitting: Hematology

## 2021-09-01 ENCOUNTER — Other Ambulatory Visit (HOSPITAL_COMMUNITY): Payer: Self-pay

## 2021-09-01 ENCOUNTER — Inpatient Hospital Stay: Payer: Medicare Other

## 2021-09-01 ENCOUNTER — Telehealth: Payer: Self-pay | Admitting: Pharmacy Technician

## 2021-09-01 ENCOUNTER — Other Ambulatory Visit: Payer: Self-pay

## 2021-09-01 ENCOUNTER — Telehealth: Payer: Self-pay | Admitting: Pharmacist

## 2021-09-01 ENCOUNTER — Inpatient Hospital Stay (HOSPITAL_BASED_OUTPATIENT_CLINIC_OR_DEPARTMENT_OTHER): Payer: Medicare Other | Admitting: Hematology

## 2021-09-01 ENCOUNTER — Other Ambulatory Visit: Payer: Self-pay | Admitting: Hematology

## 2021-09-01 VITALS — BP 129/51 | HR 69 | Temp 98.4°F | Resp 18 | Ht 65.5 in | Wt 138.4 lb

## 2021-09-01 DIAGNOSIS — G620 Drug-induced polyneuropathy: Secondary | ICD-10-CM | POA: Diagnosis not present

## 2021-09-01 DIAGNOSIS — I890 Lymphedema, not elsewhere classified: Secondary | ICD-10-CM | POA: Diagnosis not present

## 2021-09-01 DIAGNOSIS — C221 Intrahepatic bile duct carcinoma: Secondary | ICD-10-CM | POA: Diagnosis not present

## 2021-09-01 DIAGNOSIS — C786 Secondary malignant neoplasm of retroperitoneum and peritoneum: Secondary | ICD-10-CM | POA: Diagnosis not present

## 2021-09-01 DIAGNOSIS — N183 Chronic kidney disease, stage 3 unspecified: Secondary | ICD-10-CM | POA: Diagnosis not present

## 2021-09-01 DIAGNOSIS — T451X5A Adverse effect of antineoplastic and immunosuppressive drugs, initial encounter: Secondary | ICD-10-CM | POA: Diagnosis not present

## 2021-09-01 DIAGNOSIS — Z79899 Other long term (current) drug therapy: Secondary | ICD-10-CM | POA: Diagnosis not present

## 2021-09-01 DIAGNOSIS — G629 Polyneuropathy, unspecified: Secondary | ICD-10-CM | POA: Diagnosis not present

## 2021-09-01 LAB — CBC WITH DIFFERENTIAL (CANCER CENTER ONLY)
Abs Immature Granulocytes: 0.04 10*3/uL (ref 0.00–0.07)
Basophils Absolute: 0 10*3/uL (ref 0.0–0.1)
Basophils Relative: 0 %
Eosinophils Absolute: 0.1 10*3/uL (ref 0.0–0.5)
Eosinophils Relative: 1 %
HCT: 31 % — ABNORMAL LOW (ref 36.0–46.0)
Hemoglobin: 10.1 g/dL — ABNORMAL LOW (ref 12.0–15.0)
Immature Granulocytes: 1 %
Lymphocytes Relative: 8 %
Lymphs Abs: 0.7 10*3/uL (ref 0.7–4.0)
MCH: 29.5 pg (ref 26.0–34.0)
MCHC: 32.6 g/dL (ref 30.0–36.0)
MCV: 90.6 fL (ref 80.0–100.0)
Monocytes Absolute: 0.8 10*3/uL (ref 0.1–1.0)
Monocytes Relative: 9 %
Neutro Abs: 6.9 10*3/uL (ref 1.7–7.7)
Neutrophils Relative %: 81 %
Platelet Count: 182 10*3/uL (ref 150–400)
RBC: 3.42 MIL/uL — ABNORMAL LOW (ref 3.87–5.11)
RDW: 15.7 % — ABNORMAL HIGH (ref 11.5–15.5)
WBC Count: 8.6 10*3/uL (ref 4.0–10.5)
nRBC: 0 % (ref 0.0–0.2)

## 2021-09-01 LAB — COMPREHENSIVE METABOLIC PANEL
ALT: 11 U/L (ref 0–44)
AST: 31 U/L (ref 15–41)
Albumin: 2.9 g/dL — ABNORMAL LOW (ref 3.5–5.0)
Alkaline Phosphatase: 95 U/L (ref 38–126)
Anion gap: 4 — ABNORMAL LOW (ref 5–15)
BUN: 28 mg/dL — ABNORMAL HIGH (ref 8–23)
CO2: 24 mmol/L (ref 22–32)
Calcium: 8.5 mg/dL — ABNORMAL LOW (ref 8.9–10.3)
Chloride: 112 mmol/L — ABNORMAL HIGH (ref 98–111)
Creatinine, Ser: 1.35 mg/dL — ABNORMAL HIGH (ref 0.44–1.00)
GFR, Estimated: 39 mL/min — ABNORMAL LOW (ref >60)
Glucose, Bld: 197 mg/dL — ABNORMAL HIGH (ref 70–99)
Potassium: 4.3 mmol/L (ref 3.5–5.1)
Sodium: 140 mmol/L (ref 135–145)
Total Bilirubin: 0.5 mg/dL (ref 0.3–1.2)
Total Protein: 6.3 g/dL — ABNORMAL LOW (ref 6.5–8.1)

## 2021-09-01 MED ORDER — HYDROCODONE-ACETAMINOPHEN 5-300 MG PO TABS: 1.0000 | ORAL_TABLET | Freq: Two times a day (BID) | ORAL | 0 refills | Status: AC | PRN

## 2021-09-01 MED ORDER — LENVATINIB (8 MG DAILY DOSE) 2 X 4 MG PO CPPK
8.0000 mg | ORAL_CAPSULE | Freq: Every day | ORAL | 0 refills | Status: DC
  Filled 2021-09-01: qty 60, 30d supply, fill #0

## 2021-09-01 NOTE — Telephone Encounter (Signed)
Oral Oncology Pharmacist Encounter  Received new prescription for Lenvima (lenvatinib) for the treatment of metastatic intrahepatic cholangiocarcinoma, planned duration until disease progression or unacceptable drug toxicity.  CBC w/ Diff and CMP from 09/01/21 assessed, pt with Scr of 1.35 mg/dL (CrCl ~31.8 mL/min) - patient starting on lowest dose of Lenvima, no further dose adjustments required. Per MD instructions, patient will be started on 4 mg daily for first week, and then if tolerated increase to full dose of 8 mg daily thereafter.   Current medication list in Epic reviewed, DDIs with Lenvima identified: Possible risk for Qtc prolongation with concomitant use of prochlorperazine and Lenvima. Patient only utilizing prochlorperazine PRN, last EKG 03/20/21 with QTcB 418 ms. No changes in therapy warranted at this time.  Evaluated chart and no patient barriers to medication adherence noted.   Patient agreement for treatment documented in MD note on 09/01/21.  Prescription has been e-scribed to the [facility] for benefits analysis and approval.  Oral Oncology Clinic will continue to follow for insurance authorization, copayment issues, initial counseling and start date.  Leron Croak, PharmD, BCPS Hematology/Oncology Clinical Pharmacist [facility] [place] [phone] 09/01/2021 11:41 AM

## 2021-09-01 NOTE — Progress Notes (Signed)
[place]   [phone] [phone]   Clinic Follow up Note   Patient Care Team: [name], MD as PCP - General (Family Medicine) [name], [name], MD as PCP - Cardiology (Cardiology) [name], [name], MD as Consulting Physician (Cardiology) [name], MD as Consulting Physician (Obstetrics and Gynecology) [name], [name], NP as Nurse Practitioner (Hematology and Oncology) [facility] [name], MD as Consulting Physician (Hematology) [name], [name], MD as Consulting Physician (Gastroenterology) [name], RN as Oncology Nurse Navigator [name], MD as Consulting Physician (General Surgery)  Date of Service:  09/01/2021  CHIEF COMPLAINT: f/u of cholangiocarcinoma  CURRENT THERAPY:  PENDING lenvatinib  ASSESSMENT & PLAN:  Donna May is a 81 y.o. female with   1. Intrahepatic cholangiocarcinoma, cT1N0M1, with peritoneal metastasis, MSS, IDH1 mutation (+) -Diagnosed in 09/2018; biopsy of her liver mass shows adenocarcinoma, most consistent with cholangiocarcinoma. 09/29/18 PET scan showed no evidence of distant metastasis. -she was found to have peritoneal metastasis to right diaphragm and surgery was aborted. -Her CT AP from 11/29/18 shows dominate liver mass stable to mild increase, mild nodularity of both adrenal glands which warrants being monitored.  No visible peritoneal metastasis on CT scan. -FO results showed MSI stable disease, IDH mutation positive.  -She was on first-line single agent gemcitabine intermittently for about 2 years, was initially briefly on oxaliplatin but stopped it due to severe neuropathy. -she was started on ivosidenib 500 mg daily on 02/25/21 while awaiting Y90.  -CT AP 03/07/21 showed mild cancer progression in liver, no other new mets -she underwent left lobe Y90 on 05/02/21; she tolerated well overall. Dr. Pascal Lux does not plan for additional therapy given  inability to cauterize left hepatic artery.  -due to worsening neuropathy, we held her ivosidenib since 06/06/21. Despite this, she continues to have worsened neuropathy, stable since coming off the ivosidenib. -PET scan on 07/24/21 showed: interval enlargement of tracer-avid confluent mass involving both lobes of liver; several new sites tracer-avid disease within liver; no tracer-avid nodal or distant metastasis.  -she began [place] on 08/14/21, but she tolerated poorly and stopped after a few days. -we again discussed limited treatment options. I do not think she is a candidate for any intravenous chemo due to her severe neuropathy and advanced age.  I recommend oral lenvatinib. We discussed indications and side effects of lenvatinib, especially GI side effect, hypertension, arthralgia, hand-foot syndrome, abnormal liver function, rash, etc. she voiced a good understanding and agreed to try, and we will hope to start next week. Our pharmacist Wells Guiles will reach out to her.  -I also discussed palliative care alone, given the very limited treatment options.  After lengthy discussion, she will like to try lenvatinib next.  -I again encouraged her to discuss her situation with her daughter and son.  He still lives alone, likely need more care at home in the near future. -labs reviewed, overall stable.   2. Goal of care discussion, DNR -We frequently discuss the goal of her treatment is palliative and that her cancer is not curable with chemo alone at this stage. The goal is to control her disease, prolong her life, and give her good quality of life. She understands -she agreed to DNR/DNI on 07/31/21. -I recommend she see our palliative care NP Nikki to help manage her symptoms; she is agreeable.   3. Grade 3 CIPN  -secondary to previous Oxaliplatin and cisplatin. Her diabetes may contribute to it also. -low efficacy of  gabapentin and cymbalta, also tried lyrica briefly. She completed PT for balance. She  uses gabapentin as needed, mainly for sleep. -Follow-up open with Dr. Mickeal Skinner, pt did not find helpful. Also seen by chiropractor. We previously discussed some alternative treatment options. -she experienced worsening tingling on ivosidenib and stivarga.   4. H/o right breast cancer, Genetics negative  -s/p mastectomy in 1992, per patient did not require adjuvant therapy -previously followed by Dr. Jana Hakim  -VUS of gene APC; genetics otherwise normal -she has continued right arm lymphedema from surgery.   5. Comorbidities: DM, HTN, hiatal hernia, GERD, renal artery stenosis, CHF -Follow-up with PCP, nephrology, and Dr. Dwyane Dee, and cardiology -stable, well controlled   6. CKD Stage III -Scr previously stable at 1.3 - 1.6 -worsened prior to Y90 procedure, reaching 2.25 the day of procedure.  -stable at 1.35 today (09/01/21)     PLAN: -plan to start lenvatinib when she receives it.  Due to her CKD, I will start her on 4 mg daily for the first 2 weeks, then go up to 8 mg daily and continue if she tolerates  -referral to palliative care NP Nikki in 1-2 weeks, I spoke with Lexine Baton today  -lab, flush, and f/u in 3 weeks   No problem-specific Assessment & Plan notes found for this encounter.   SUMMARY OF ONCOLOGIC HISTORY: Oncology History Overview Note  Cancer Staging Intrahepatic cholangiocarcinoma ([place]) Staging form: Intrahepatic Bile Duct, AJCC 8th Edition - Clinical stage from 09/23/2018: Stage IB (cT1b, cN0, cM0) - Signed by [name], MD on 10/06/2018 Histologic grade (G): G3 Histologic grading system: 3 grade system    Intrahepatic cholangiocarcinoma ([place])  09/07/2018 Imaging   CT Chest IMPRESSION: 1. New, enhancing mass involving segment 4 of the liver and fundus of gallbladder is concerning for malignancy. This may represent either metastatic disease from breast cancer or neoplasm primary to the liver or hepatic biliary tree. Further evaluation with contrast enhanced CT of the  abdomen and pelvis is recommended. 2. No findings to suggest metastatic disease within the chest. 3.  Aortic Atherosclerosis (ICD10-I70.0). 4. Coronary artery calcifications.   09/13/2018 Pathology Results   Diagnosis Liver, needle/core biopsy - ADENOCARCINOMA. Microscopic Comment Immunohistochemistry for CK7 is positive. CK20, TTF1, CDX-2, GATA-3, PAX 8, Qualitative ER, p63 and CK5/6 are negative. The provided clinical history of remote mammary carcinoma is noted. Based on the morphology and immunophenotype of the adenocarcinoma observed in this specimen, primary cholangiocarcinoma is favored. Clinical and radiologic correlation are  encouraged. Results reported to [employer] on 09/15/2018. Intradepartmental consultation (Dr. Vic Ripper).   09/13/2018 Initial Diagnosis   Cholangiocarcinoma ([place])   09/23/2018 Procedure   Colonoscopy by Dr. Havery Moros 09/23/18  IMPRESSION - Two 3 to 4 mm polyps in the ascending colon, removed with a cold snare. Resected and retrieved. - Five 3 to 5 mm polyps in the transverse colon, removed with a cold snare. Resected and retrieved. - One 5 mm polyp at the splenic flexure, removed with a cold snare. Resected and retrieved. - Three 3 to 5 mm polyps in the sigmoid colon, removed with a cold snare. Resected and retrieved. - The examination was otherwise normal. Upper Endopscy by Dr. Havery Moros 09/23/18  IMPRESSION - Esophagogastric landmarks identified. - 2 cm hiatal hernia. - Normal esophagus otherwise. - A single gastric polyp. Resected and retrieved. - Mild gastritis. Biopsied. - Normal duodenal bulb and second portion of the duodenum.   09/23/2018 Pathology Results   Diagnosis 09/23/18 1. Surgical [P], duodenum - BENIGN SMALL  BOWEL MUCOSA. - NO ACTIVE INFLAMMATION OR VILLOUS ATROPHY IDENTIFIED. 2. Surgical [P], stomach, polyp - HYPERPLASTIC POLYP(S). - THERE IS NO EVIDENCE OF MALIGNANCY. 3. Surgical [P], gastric antrum and gastric body -  CHRONIC INACTIVE GASTRITIS. - THERE IS NO EVIDENCE OF HELICOBACTER-PYLORI, DYSPLASIA, OR MALIGNANCY. - SEE COMMENT. 4. Surgical [P], colon, sigmoid, splenic flexure, transverse and ascending, polyp (9) - TUBULAR ADENOMA(S). - SESSILE SERRATED POLYP WITHOUT CYTOLOGIC DYSPLASIA. - HIGH GRADE DYSPLASIA IS NOT IDENTIFIED. 5. Surgical [P], colon, sigmoid, polyp (2) - HYPERPLASTIC POLYP(S). - THERE IS NO EVIDENCE OF MALIGNANCY.   09/23/2018 Cancer Staging   Staging form: Intrahepatic Bile Duct, AJCC 8th Edition - Clinical stage from 09/23/2018: Stage IB (cT1b, cN0, cM0) - Signed by [name], MD on 10/06/2018   09/29/2018 PET scan   PET 09/29/18 IMPRESSION: 1. Hypermetabolic mass in the RIGHT hepatic lobe consistent with biopsy proven adenocarcinoma. No additional liver metastasis. 2. No evidence of local breast cancer recurrence in the RIGHT breast or RIGHT axilla. 3. Mild bilateral hypermetabolic adrenal glands is favored benign hyperplasia. 4. No evidence of additional metastatic disease on skull base to thigh FDG PET scan.   11/06/2018 Genetic Testing   Negative genetic testing on the [facility] Common Hereditary Cancers panel. A variant of uncertain significance was identified in one of her APC genes, called c.1243G>A (p.Ala415Thr).  The Common Hereditary Cancers Panel offered by [facility] includes sequencing and/or deletion duplication testing of the following 48 genes: APC, ATM, AXIN2, BARD1, BMPR1A, BRCA1, BRCA2, BRIP1, CDH1, CDK4, CDKN2A (p14ARF), CDKN2A (p16INK4a), CHEK2, CTNNA1, DICER1, EPCAM (Deletion/duplication testing only), GREM1 (promoter region deletion/duplication testing only), KIT, MEN1, MLH1, MSH2, MSH3, MSH6, MUTYH, NBN, NF1, NHTL1, PALB2, PDGFRA, PMS2, POLD1, POLE, PTEN, RAD50, RAD51C, RAD51D, RNF43, SDHB, SDHC, SDHD, SMAD4, SMARCA4. STK11, TP53, TSC1, TSC2, and VHL.  The following genes were evaluated for sequence changes only: SDHA and HOXB13 c.251G>A variant only.     11/17/2018 Pathology Results   Diagnosis 11/17/18 1. Soft tissue, biopsy, Diaphragmatic nodules - METASTATIC ADENOCARCINOMA, CONSISTENT WITH PATIENT'S CLINICAL HISTORY OF CHOLANGIOCARCINOMA. SEE NOTE 2. Liver, biopsy, Left - LIVER PARENCHYMA WITH A BENIGN FIBROTIC NODULE - NO EVIDENCE OF MALIGNANCY 3. Stomach, biopsy - BENIGN PAPILLARY MESOTHELIAL HYPERPLASIA - NO EVIDENCE OF MALIGNANCY   11/29/2018 Imaging   CT CAP WO Contrast  IMPRESSION: 1. Dominant liver mass appears grossly stable from 09/29/2018. Additional liver lesions are too small to characterize but were not shown to be hypermetabolic on PET. 2. Mild nodularity of both adrenal glands with associated hypermetabolism on 09/29/2018. Continued attention on follow-up exams is warranted. 3. Small right lower lobe nodules, stable from 09/07/2018. Again, attention on follow-up is recommended. 4. Trace bilateral pleural fluid. 5. Aortic atherosclerosis (ICD10-170.0). Coronary artery calcification. 6. Enlarged pulmonic trunk, indicative of pulmonary arterial hypertension.     11/30/2018 - 06/14/2019 Chemotherapy   First line chemo Oxaliplatin and gemcitabine q2weeks starting 11/30/18. Stopped Oxaliplatin on 05/03/19 due to worsening neuropathy. Added Cisplatin with C13 on 05/17/19 and stopped on C15 06/14/19 due to neuropathy. Reduced to maintenance single agent Gemcitabine on 06/28/19.    01/20/2019 Imaging   CT CAP IMPRESSION: restaging  1. Mild interval increase in size of dominant liver mass involving segment 4 and segment 5. 2. No significant or progressive adrenal nodularity identified to suggest metastatic disease. 3. Unchanged appearance of small right lower lobe and lingular lung nodules, nonspecific.   03/21/2019 PET scan   IMPRESSION: 1. Large right hepatic mass with SUV uptake near background hepatic activity, dramatic response  to therapy, also with decrease in size when compared to the prior study. 2. Signs of prior  right mastectomy and axillary dissection as before. 3. Left adrenal activity in no longer above the level of activity seen in the contralateral, right adrenal gland or in the liver. Attention on follow-up. 4. No new signs of disease.   06/27/2019 PET scan   IMPRESSION: 1. Further decrease in size of a dominant hepatic mass which is non FDG avid. 2. No evidence of metastatic disease. 3. No evidence of left adrenal hypermetabolism or mass. 4. Incidental findings, including: Uterine fibroids. Coronary artery atherosclerosis. Aortic Atherosclerosis (ICD10-I70.0). Pulmonary artery enlargement suggests pulmonary arterial hypertension.   06/28/2019 -  Chemotherapy   Maintenance single agent Gemcitabine q2weeks starting on 06/28/19 with C16.  ------On chemo break since 12/27/19.  ------She restarted Single Agent Gemcitabine q2weeks on 04/03/20 due to mild disease progression in liver on 02/2020 PET    09/15/2019 PET scan   IMPRESSION: 1. In the region of regional tumor at the junction of the right and left hepatic lobe, there is continued hypodensity and overall activity slightly less than that of the surrounding normal liver, indicating effective response to therapy. No current worrisome hypermetabolic lesion is identified in the liver or elsewhere. 2. Chronic asymmetric edema in the subcutaneous tissues of the right upper extremity, nonspecific. The patient has had a prior right mastectomy and right axillary dissection. 3. Other imaging findings of potential clinical significance: Aortic Atherosclerosis (ICD10-I70.0). Coronary atherosclerosis. Mild cardiomegaly. Small right and trace left pleural effusions. Subcutaneous and mild mesenteric edema. Suspected uterine fibroids.   12/18/2019 PET scan   IMPRESSION: 1. No significant abnormal activity to suggest active/recurrent malignancy. 2. Lingual tonsillar activity is mildly increased from prior but probably incidental/physiologic,  attention on follow up suggested. 3. Other imaging findings of potential clinical significance: Third spacing of fluid with trace ascites, mesenteric edema, subcutaneous edema, and possibly subtle pulmonary edema. Mild cardiomegaly. Aortic Atherosclerosis (ICD10-I70.0). Coronary atherosclerosis. Right ovarian dermoid. Calcified uterine fibroids. Renal cysts. Right glenohumeral arthropathy. Mild to moderate scoliosis. Suspected pulmonary arterial hypertension.   03/14/2020 PET scan   IMPRESSION: 1. New small focus of hypermetabolism in segment 4A of the liver worrisome for new metastatic focus. 2. No other findings for metastatic disease involving the neck, chest, abdomen or pelvis.     06/11/2020 PET scan   IMPRESSION: 1. No suspicious hypermetabolic activity within the neck, chest, abdomen or pelvis. 2. The treated dominant peripheral cholangiocarcinoma appears slightly enlarged compared with recent prior studies, and there is a questionable new lesion more superiorly in the left lobe. Although without associated hypermetabolic activity, these lesions are suspicious and would be better assessed with abdominal MRI without and with contrast.   09/17/2020 PET scan   IMPRESSION: 1. Recurrent hypermetabolic activity around the periphery of the dominant hepatic mass consistent with local recurrence. This mass appears slightly larger. Possible small metastasis in the dome of the left hepatic lobe is unchanged in size, without hypermetabolic activity. 2. No distant metastases identified. 3. Stable incidental findings as detailed above.   10/16/2020 -  Chemotherapy   Patient is on Treatment Plan : BLADDER Durvalumab q28d     01/02/2021 PET scan   IMPRESSION: 1. Further enlargement of hypermetabolic hepatic mass superior to the cholecystectomy bed consistent with progressive local tumor recurrence. Satellite lesion more superiorly in the anterior dome of the liver has also enlarged, without  definite hypermetabolic activity. 2. No distant metastases identified. 3. Stable incidental findings including aortic  atherosclerosis, hepatic and renal cysts and uterine fibroids.   07/24/2021 PET scan   IMPRESSION: 1. Interval enlargement of tracer avid confluent mass involving both lobes of liver centered around segment 4. Several new sites of tracer avid disease identified within the liver. 2. No signs of tracer avid nodal metastasis or distant metastatic disease. 3.  Aortic Atherosclerosis (ICD10-I70.0).      INTERVAL HISTORY:  RHEALYN CULLEN is here for a follow up of cholangiocarcinoma. She was last seen by Dr. Alvy Bimler on 08/22/21. She presents to the clinic alone. She reports she is feeling better of the stivarga but not fully recovered. She reports her main complaint continues to be neuropathy.   All other systems were reviewed with the patient and are negative.  MEDICAL HISTORY:  Past Medical History:  Diagnosis Date   Anemia    Anxiety    Arthritis    Breast cancer ([place]) 1992   right   Cataract    Bilateral eyes - surgery to remove   Chronic diastolic CHF (congestive heart failure) ([place])    Chronic kidney disease    CKD stage 3   Complication of anesthesia    "something they use make me itch for a couple of days."   Depression    Diabetes mellitus    type 2   Duodenitis 01/18/2002   Fainting spell    GERD (gastroesophageal reflux disease)    Heart murmur    never has caused any problems   Hiatal hernia 08/08/2008, 01/18/2002   History of pneumonia    x 2   Hyperlipidemia    Hypertension    Liver cancer ([place]) 08/2018   chemotherapy   Lymphedema 2017   Right arm   Peripheral neuropathy     SURGICAL HISTORY: Past Surgical History:  Procedure Laterality Date   AXILLARY SURGERY     cyst removal, right   COLONOSCOPY  09/23/2018   Dr. Havery Moros - polyps   EYE SURGERY Bilateral    cataracts to remove   IR 3D INDEPENDENT WKST  05/02/2021   IR ANGIOGRAM  SELECTIVE EACH ADDITIONAL VESSEL  04/18/2021   IR ANGIOGRAM SELECTIVE EACH ADDITIONAL VESSEL  04/18/2021   IR ANGIOGRAM SELECTIVE EACH ADDITIONAL VESSEL  05/02/2021   IR ANGIOGRAM SELECTIVE EACH ADDITIONAL VESSEL  05/02/2021   IR ANGIOGRAM SELECTIVE EACH ADDITIONAL VESSEL  05/02/2021   IR ANGIOGRAM SELECTIVE EACH ADDITIONAL VESSEL  05/02/2021   IR ANGIOGRAM SELECTIVE EACH ADDITIONAL VESSEL  05/02/2021   IR ANGIOGRAM VISCERAL SELECTIVE  04/18/2021   IR ANGIOGRAM VISCERAL SELECTIVE  05/02/2021   IR EMBO TUMOR ORGAN ISCHEMIA INFARCT INC GUIDE ROADMAPPING  05/02/2021   IR RADIOLOGIST EVAL & MGMT  01/30/2021   IR RADIOLOGIST EVAL & MGMT  05/22/2021   IR RADIOLOGIST EVAL & MGMT  08/08/2021   IR US GUIDE VASC ACCESS RIGHT  04/18/2021   IR US GUIDE VASC ACCESS RIGHT  05/02/2021   LAPAROSCOPY N/A 11/17/2018   Procedure: LAPAROSCOPY DIAGNOSTIC, INTRAOPERATIVE ULTRASOUND, PERITONEAL BIOPSIES;  Surgeon: [name], MD;  Location: [facility];  Service: General;  Laterality: N/A;  GENERAL AND EPIDURAL   LIVER BIOPSY  08/2018   Dr. Lindwood Coke   MASTECTOMY  1992   right, with flap   NM [place]  06/11/2009   Protocol:[place], post stress EF58%, EKG negative for ischemia, low risk   PORTACATH PLACEMENT N/A 12/02/2018   Procedure: INSERTION PORT-A-CATH;  Surgeon: [name], MD;  Location: [place] [facility];  Service: General;  Laterality: N/A;  RECONSTRUCTION BREAST W/ TRAM FLAP Right    TONSILLECTOMY  1958   TRANSTHORACIC ECHOCARDIOGRAM  12/24/2009   LVEF =>55%, normal study   UPPER GI ENDOSCOPY      I have reviewed the social history and family history with the patient and they are unchanged from previous note.  ALLERGIES:  has No Known Allergies.  MEDICATIONS:  Current Outpatient Medications  Medication Sig Dispense Refill   HYDROcodone-Acetaminophen 5-300 MG TABS Take 1 tablet by mouth every 12 (twelve) hours as needed. 20 tablet 0   lenvatinib 8 mg daily dose (LENVIMA) 2 x 4 MG capsule Take 2 capsules (8 mg  total) by mouth daily. Start at 1 capsule daily for first week, then increase to 2 capsules daily if tolerates well 40 capsule 0   amLODipine (NORVASC) 10 MG tablet Take 1 tablet (10 mg total) by mouth daily. 90 tablet 1   aspirin 81 MG tablet Take 81 mg by mouth daily.      Baclofen 5 MG TABS TAKE 1/2 TABLET BY MOUTH ONCE DAILY AS NEEDED 42 tablet 1   blood glucose meter kit and supplies KIT Dispense based on patient and insurance preference. Use up to four times daily as directed. 1 each 0   carvedilol (COREG) 25 MG tablet Take 1 tablet (25 mg total) by mouth 2 (two) times daily with a meal. 180 tablet 3   ezetimibe (ZETIA) 10 MG tablet Take 1 tablet (10 mg total) by mouth daily. 30 tablet 3   furosemide (LASIX) 40 MG tablet TAKE 1 TABLET BY MOUTH EVERY DAY 90 tablet 1   gabapentin (NEURONTIN) 100 MG capsule Take 1 capsule (100 mg total) by mouth every 8 (eight) hours as needed. 180 capsule 2   glucose blood (ONETOUCH VERIO) test strip Use up to 3 times daily to check blood sugar. 100 strip 3   hydrALAZINE (APRESOLINE) 100 MG tablet TAKE THREE TIMES DAILY-AM, MIDDLE OF THE DAY AND IN THE EVENING. 270 tablet 3   KLOR-CON M10 10 MEQ tablet TAKE 1 TABLET BY MOUTH EVERY DAY 90 tablet 1   lansoprazole (PREVACID) 15 MG capsule Take 1 capsule (15 mg total) by mouth daily at 12 noon. Take 1 capsule by mouth once or twice a day as needed 90 capsule 3   lidocaine-prilocaine (EMLA) cream Apply 1 application topically as needed. 30 g 1   loratadine (CLARITIN) 10 MG tablet Take 10 mg by mouth daily as needed for allergies.     Omega 3 1000 MG CAPS Take 2,000 mg by mouth daily.      polyethylene glycol (MIRALAX / GLYCOLAX) 17 g packet Take 17 g by mouth daily as needed. 14 each 0   prochlorperazine (COMPAZINE) 10 MG tablet Take 1 tablet (10 mg total) by mouth every 6 (six) hours as needed for nausea or vomiting. 30 tablet 3   sitaGLIPtin (JANUVIA) 50 MG tablet Take 1 tablet (50 mg total) by mouth daily. 90  tablet 1   valsartan (DIOVAN) 320 MG tablet Take 1 tablet (320 mg total) by mouth daily. 90 tablet 3   Current Facility-Administered Medications  Medication Dose Route Frequency Provider Last Rate Last Admin   triamcinolone acetonide (KENALOG) 10 MG/ML injection 10 mg  10 mg Other Once Harriet Masson, DPM        PHYSICAL EXAMINATION: ECOG PERFORMANCE STATUS: 2 - Symptomatic, <50% confined to bed  Vitals:   09/01/21 1038  BP: (!) 129/51  Pulse: 69  Resp: 18  Temp: 98.4  F (36.9 C)  SpO2: 99%   Wt Readings from Last 3 Encounters:  09/01/21 138 lb 6.4 oz (62.8 kg)  08/29/21 132 lb (59.9 kg)  08/27/21 132 lb (59.9 kg)     GENERAL:alert, no distress and comfortable SKIN: skin color normal, no rashes or significant lesions EYES: normal, Conjunctiva are pink and non-injected, sclera clear  NEURO: alert & oriented x 3 with fluent speech  LABORATORY DATA:  I have reviewed the data as listed    Latest Ref Rng & Units 09/01/2021   10:11 AM 08/22/2021   10:13 AM 07/31/2021    7:51 AM  CBC  WBC 4.0 - 10.5 K/uL 8.6  7.9  6.0   Hemoglobin 12.0 - 15.0 g/dL 10.1  10.8  11.2   Hematocrit 36.0 - 46.0 % 31.0  33.3  34.5   Platelets 150 - 400 K/uL 182  192  170         Latest Ref Rng & Units 09/01/2021   10:11 AM 08/22/2021   11:04 AM 07/31/2021    7:51 AM  CMP  Glucose 70 - 99 mg/dL 197  124  186   BUN 8 - 23 mg/dL 28  49  26   Creatinine 0.44 - 1.00 mg/dL 1.35  1.45  1.26   Sodium 135 - 145 mmol/L 140  139  138   Potassium 3.5 - 5.1 mmol/L 4.3  4.3  4.4   Chloride 98 - 111 mmol/L 112  111  106   CO2 22 - 32 mmol/L $RemoveB'24  23  26   'eQeAVZMf$ Calcium 8.9 - 10.3 mg/dL 8.5  8.6  8.5   Total Protein 6.5 - 8.1 g/dL 6.3  6.5  7.0   Total Bilirubin 0.3 - 1.2 mg/dL 0.5  0.5  0.4   Alkaline Phos 38 - 126 U/L 95  96  102   AST 15 - 41 U/L 31  34  31   ALT 0 - 44 U/L $Remo'11  14  13       'ydKgM$ RADIOGRAPHIC STUDIES: I have personally reviewed the radiological images as listed and agreed with the findings in  the report. No results found.    Orders Placed This Encounter  Procedures   Amb Referral to Palliative Care    Referral Priority:   Routine    Referral Type:   Consultation    Number of Visits Requested:   1   All questions were answered. The patient knows to call the clinic with any problems, questions or concerns. No barriers to learning was detected. The total time spent in the appointment was 40 minutes.     [name], MD 09/01/2021   I, Wilburn Mylar, am acting as scribe for [name], MD.   I have reviewed the above documentation for accuracy and completeness, and I agree with the above.

## 2021-09-01 NOTE — Telephone Encounter (Signed)
Oral Oncology Patient Advocate Encounter   Received notification that prior authorization for Lenvima is required.   PA submitted on 09/01/2021 Key [id number] Status is pending     Donna May, CPhT-Adv Pharmacy Patient Advocate Specialist [place] Patient Advocate Team Direct Number: [phone]  Fax: [phone]

## 2021-09-02 ENCOUNTER — Other Ambulatory Visit (HOSPITAL_COMMUNITY): Payer: Self-pay

## 2021-09-02 NOTE — Telephone Encounter (Signed)
Oral Oncology Patient Advocate Encounter   An urgent appeal for the prior authorization denial of Michel Santee has been started by the pharmacist on 09/02/2021.   This encounter will continue to be updated until final appeal determination.       [name], CPhT-Adv Pharmacy Patient Advocate Specialist [place] Patient Advocate Team Direct Number: [phone]  Fax: [phone]

## 2021-09-02 NOTE — Telephone Encounter (Signed)
Oral Oncology Pharmacist Encounter   Prior Authorization for Michel Santee has been denied.     Will proceed with appeal process at this time.     Oral Oncology Clinic will continue to follow.    Leron Croak, PharmD, BCPS Hematology/Oncology Clinical Pharmacist [facility] [phone] 09/02/2021 8:37 AM

## 2021-09-03 NOTE — Telephone Encounter (Signed)
Oral Oncology Pharmacist Encounter   Met with patient in clinic this AM to obtain signatures for [employer] Program Form to allow [facility] to appeal denied request for Lenvima on patient's behalf.   Appeal letter sent to [facility] with supporting documentation. Appeal faxed to: [phone].    Oral Oncology Clinic will continue to follow.    Leron Croak, PharmD, BCPS Hematology/Oncology Clinical Pharmacist [facility] [phone] 09/03/2021 9:38 AM

## 2021-09-05 ENCOUNTER — Telehealth: Payer: Self-pay

## 2021-09-05 ENCOUNTER — Telehealth: Payer: Self-pay | Admitting: Pharmacy Technician

## 2021-09-05 NOTE — Telephone Encounter (Signed)
Oral Oncology Patient Advocate Encounter   Began application for assistance for Lenvima through [facility].   Application submitted on 09/05/2021   [employer] phone number [phone].   I will continue to check the status until final determination.   Jinger Neighbors, CPhT-Adv Pharmacy Patient Advocate Specialist [facility] Patient Advocate Team Direct Number: [phone]  Fax: [phone]

## 2021-09-08 MED ORDER — LENVATINIB (8 MG DAILY DOSE) 2 X 4 MG PO CPPK: 8.0000 mg | ORAL_CAPSULE | Freq: Every day | ORAL | 0 refills | Status: AC

## 2021-09-09 ENCOUNTER — Encounter: Payer: Self-pay | Admitting: General Practice

## 2021-09-09 NOTE — Progress Notes (Signed)
[facility] Spiritual Care Note  Followed up with Ms Donna May by phone for pastoral check-in. She recently enjoyed a [employer] at her church, which was a meaningful time of fellowship and support. She reports continued good support from her family as well.  She has some questions about the distinction between palliative care and hospice, as well as about the goals and approach of hospice care. We discussed these together, and she plans to speak with Lowella Bandy Cousar/NP in more detail at her appointment tomorrow.  We plan to follow up in mid-July, after her next appointment with Dr Mosetta Putt.   [address] [place], [place], [facility] Pager [phone] Voicemail [phone]

## 2021-09-09 NOTE — Telephone Encounter (Signed)
Oral Oncology Patient Advocate Encounter   Received notification that the application for assistance for Lenvima through [employer] has been approved.   [employer] phone number [phone].   Effective dates: 09/08/2021 through 03/15/2022  Jinger Neighbors, CPhT-Adv Pharmacy Patient Advocate Specialist [facility] Patient Advocate Team Direct Number: [phone]  Fax: [phone]

## 2021-09-10 ENCOUNTER — Inpatient Hospital Stay (HOSPITAL_BASED_OUTPATIENT_CLINIC_OR_DEPARTMENT_OTHER): Payer: Medicare Other | Admitting: Nurse Practitioner

## 2021-09-10 ENCOUNTER — Other Ambulatory Visit: Payer: Self-pay

## 2021-09-10 ENCOUNTER — Encounter: Payer: Self-pay | Admitting: Nurse Practitioner

## 2021-09-10 VITALS — BP 123/45 | HR 65 | Temp 98.6°F | Resp 15 | Wt 135.5 lb

## 2021-09-10 DIAGNOSIS — R634 Abnormal weight loss: Secondary | ICD-10-CM | POA: Diagnosis not present

## 2021-09-10 DIAGNOSIS — R630 Anorexia: Secondary | ICD-10-CM

## 2021-09-10 DIAGNOSIS — Z7189 Other specified counseling: Secondary | ICD-10-CM

## 2021-09-10 DIAGNOSIS — I890 Lymphedema, not elsewhere classified: Secondary | ICD-10-CM | POA: Diagnosis not present

## 2021-09-10 DIAGNOSIS — M792 Neuralgia and neuritis, unspecified: Secondary | ICD-10-CM | POA: Diagnosis not present

## 2021-09-10 DIAGNOSIS — C221 Intrahepatic bile duct carcinoma: Secondary | ICD-10-CM | POA: Diagnosis not present

## 2021-09-10 DIAGNOSIS — C786 Secondary malignant neoplasm of retroperitoneum and peritoneum: Secondary | ICD-10-CM | POA: Diagnosis not present

## 2021-09-10 DIAGNOSIS — N183 Chronic kidney disease, stage 3 unspecified: Secondary | ICD-10-CM | POA: Diagnosis not present

## 2021-09-10 DIAGNOSIS — G629 Polyneuropathy, unspecified: Secondary | ICD-10-CM | POA: Diagnosis not present

## 2021-09-10 DIAGNOSIS — R531 Weakness: Secondary | ICD-10-CM | POA: Diagnosis not present

## 2021-09-10 DIAGNOSIS — Z515 Encounter for palliative care: Secondary | ICD-10-CM | POA: Diagnosis not present

## 2021-09-10 DIAGNOSIS — Z79899 Other long term (current) drug therapy: Secondary | ICD-10-CM | POA: Diagnosis not present

## 2021-09-10 MED ORDER — DULOXETINE HCL 30 MG PO CPEP: 30.0000 mg | ORAL_CAPSULE | Freq: Every day | ORAL | 0 refills | Status: DC

## 2021-09-10 NOTE — Progress Notes (Signed)
[place]  [phone] [phone]) [phone]   Name: MIXTLI RENO Date: 09/10/2021 MRN: [id number]  DOB: [birth year]  Patient Care Team: [name], MD as PCP - General (Family Medicine) [name], [name], MD as PCP - Cardiology (Cardiology) [name], [name], MD as Consulting Physician (Cardiology) [name], MD as Consulting Physician (Obstetrics and Gynecology) [name], [name], NP as Nurse Practitioner (Hematology and Oncology) [facility] [name], MD as Consulting Physician (Hematology) [name], [name], MD as Consulting Physician (Gastroenterology) [name], RN as Oncology Nurse Navigator [name], MD as Consulting Physician (General Surgery)    REASON FOR CONSULTATION: KAYTLYN DIN is a 81 y.o. female with medical history including cholangiocarcinoma with peritoneal metastasis (09/2018) s/p Y90 (04/2021), chemotherapy, currently on oral chemotherapy. Not a candidate for intravenous treatment .  Palliative ask to see for symptom management and goals of care.    SOCIAL HISTORY:     reports that she quit smoking about 33 years ago. Her smoking use included cigarettes. She has a 6.25 pack-year smoking history. She has never used smokeless tobacco. She reports that she does not currently use alcohol. She reports that she does not use drugs.  ADVANCE DIRECTIVES:  Patient has a documented advanced directive on file. Her daughter and son are listed as her primary decision makers. DNR/DNI confirmed.   CODE STATUS: DNR  PAST MEDICAL HISTORY: Past Medical History:  Diagnosis Date   Anemia    Anxiety    Arthritis    Breast cancer ([place]) 1992   right   Cataract    Bilateral eyes - surgery to remove   Chronic diastolic CHF (congestive heart failure) (HCC)    Chronic kidney disease    CKD stage 3   Complication of anesthesia    "something they use make me itch for a couple of  days."   Depression    Diabetes mellitus    type 2   Duodenitis 01/18/2002   Fainting spell    GERD (gastroesophageal reflux disease)    Heart murmur    never has caused any problems   Hiatal hernia 08/08/2008, 01/18/2002   History of pneumonia    x 2   Hyperlipidemia    Hypertension    Liver cancer ([place]) 08/2018   chemotherapy   Lymphedema 2017   Right arm   Peripheral neuropathy     PAST SURGICAL HISTORY:  Past Surgical History:  Procedure Laterality Date   AXILLARY SURGERY     cyst removal, right   COLONOSCOPY  09/23/2018   Dr. Havery Moros - polyps   EYE SURGERY Bilateral    cataracts to remove   IR 3D INDEPENDENT WKST  05/02/2021   IR ANGIOGRAM SELECTIVE EACH ADDITIONAL VESSEL  04/18/2021   IR ANGIOGRAM SELECTIVE EACH ADDITIONAL VESSEL  04/18/2021   IR ANGIOGRAM SELECTIVE EACH ADDITIONAL VESSEL  05/02/2021   IR ANGIOGRAM SELECTIVE EACH ADDITIONAL VESSEL  05/02/2021   IR ANGIOGRAM SELECTIVE EACH ADDITIONAL VESSEL  05/02/2021   IR ANGIOGRAM SELECTIVE EACH ADDITIONAL VESSEL  05/02/2021   IR ANGIOGRAM SELECTIVE EACH ADDITIONAL VESSEL  05/02/2021   IR ANGIOGRAM VISCERAL SELECTIVE  04/18/2021   IR ANGIOGRAM VISCERAL SELECTIVE  05/02/2021   IR EMBO TUMOR ORGAN ISCHEMIA INFARCT INC GUIDE ROADMAPPING  05/02/2021   IR RADIOLOGIST EVAL & MGMT  01/30/2021   IR RADIOLOGIST EVAL & MGMT  05/22/2021   IR RADIOLOGIST EVAL & MGMT  08/08/2021   IR US GUIDE VASC  ACCESS RIGHT  04/18/2021   IR US GUIDE VASC ACCESS RIGHT  05/02/2021   LAPAROSCOPY N/A 11/17/2018   Procedure: LAPAROSCOPY DIAGNOSTIC, INTRAOPERATIVE ULTRASOUND, PERITONEAL BIOPSIES;  Surgeon: [name], MD;  Location: [place];  Service: General;  Laterality: N/A;  GENERAL AND EPIDURAL   LIVER BIOPSY  08/2018   Dr. Lindwood Coke   MASTECTOMY  1992   right, with flap   NM [place]  06/11/2009   Protocol:[place], post stress EF58%, EKG negative for ischemia, low risk   PORTACATH PLACEMENT N/A 12/02/2018   Procedure: INSERTION PORT-A-CATH;   Surgeon: [name], MD;  Location: [place];  Service: General;  Laterality: N/A;   RECONSTRUCTION BREAST W/ TRAM FLAP Right    TONSILLECTOMY  1958   TRANSTHORACIC ECHOCARDIOGRAM  12/24/2009   LVEF =>55%, normal study   UPPER GI ENDOSCOPY      HEMATOLOGY/ONCOLOGY HISTORY:  Oncology History Overview Note  Cancer Staging Intrahepatic cholangiocarcinoma ([place]) Staging form: Intrahepatic Bile Duct, AJCC 8th Edition - Clinical stage from 09/23/2018: Stage IB (cT1b, cN0, cM0) - Signed by [name], MD on 10/06/2018 Histologic grade (G): G3 Histologic grading system: 3 grade system    Intrahepatic cholangiocarcinoma ([place])  09/07/2018 Imaging   CT Chest IMPRESSION: 1. New, enhancing mass involving segment 4 of the liver and fundus of gallbladder is concerning for malignancy. This may represent either metastatic disease from breast cancer or neoplasm primary to the liver or hepatic biliary tree. Further evaluation with contrast enhanced CT of the abdomen and pelvis is recommended. 2. No findings to suggest metastatic disease within the chest. 3.  Aortic Atherosclerosis (ICD10-I70.0). 4. Coronary artery calcifications.   09/13/2018 Pathology Results   Diagnosis Liver, needle/core biopsy - ADENOCARCINOMA. Microscopic Comment Immunohistochemistry for CK7 is positive. CK20, TTF1, CDX-2, GATA-3, PAX 8, Qualitative ER, p63 and CK5/6 are negative. The provided clinical history of remote mammary carcinoma is noted. Based on the morphology and immunophenotype of the adenocarcinoma observed in this specimen, primary cholangiocarcinoma is favored. Clinical and radiologic correlation are  encouraged. Results reported to [employer] on 09/15/2018. Intradepartmental consultation (Dr. Vic Ripper).   09/13/2018 Initial Diagnosis   Cholangiocarcinoma ([place])   09/23/2018 Procedure   Colonoscopy by Dr. Havery Moros 09/23/18  IMPRESSION - Two 3 to 4 mm polyps in the ascending colon, removed with a cold snare.  Resected and retrieved. - Five 3 to 5 mm polyps in the transverse colon, removed with a cold snare. Resected and retrieved. - One 5 mm polyp at the splenic flexure, removed with a cold snare. Resected and retrieved. - Three 3 to 5 mm polyps in the sigmoid colon, removed with a cold snare. Resected and retrieved. - The examination was otherwise normal. Upper Endopscy by Dr. Havery Moros 09/23/18  IMPRESSION - Esophagogastric landmarks identified. - 2 cm hiatal hernia. - Normal esophagus otherwise. - A single gastric polyp. Resected and retrieved. - Mild gastritis. Biopsied. - Normal duodenal bulb and second portion of the duodenum.   09/23/2018 Pathology Results   Diagnosis 09/23/18 1. Surgical [P], duodenum - BENIGN SMALL BOWEL MUCOSA. - NO ACTIVE INFLAMMATION OR VILLOUS ATROPHY IDENTIFIED. 2. Surgical [P], stomach, polyp - HYPERPLASTIC POLYP(S). - THERE IS NO EVIDENCE OF MALIGNANCY. 3. Surgical [P], gastric antrum and gastric body - CHRONIC INACTIVE GASTRITIS. - THERE IS NO EVIDENCE OF HELICOBACTER-PYLORI, DYSPLASIA, OR MALIGNANCY. - SEE COMMENT. 4. Surgical [P], colon, sigmoid, splenic flexure, transverse and ascending, polyp (9) - TUBULAR ADENOMA(S). - SESSILE SERRATED POLYP WITHOUT CYTOLOGIC DYSPLASIA. - HIGH GRADE DYSPLASIA IS  NOT IDENTIFIED. 5. Surgical [P], colon, sigmoid, polyp (2) - HYPERPLASTIC POLYP(S). - THERE IS NO EVIDENCE OF MALIGNANCY.   09/23/2018 Cancer Staging   Staging form: Intrahepatic Bile Duct, AJCC 8th Edition - Clinical stage from 09/23/2018: Stage IB (cT1b, cN0, cM0) - Signed by [name], MD on 10/06/2018   09/29/2018 PET scan   PET 09/29/18 IMPRESSION: 1. Hypermetabolic mass in the RIGHT hepatic lobe consistent with biopsy proven adenocarcinoma. No additional liver metastasis. 2. No evidence of local breast cancer recurrence in the RIGHT breast or RIGHT axilla. 3. Mild bilateral hypermetabolic adrenal glands is favored benign hyperplasia. 4. No  evidence of additional metastatic disease on skull base to thigh FDG PET scan.   11/06/2018 Genetic Testing   Negative genetic testing on the [facility] Common Hereditary Cancers panel. A variant of uncertain significance was identified in one of her APC genes, called c.1243G>A (p.Ala415Thr).  The Common Hereditary Cancers Panel offered by [facility] includes sequencing and/or deletion duplication testing of the following 48 genes: APC, ATM, AXIN2, BARD1, BMPR1A, BRCA1, BRCA2, BRIP1, CDH1, CDK4, CDKN2A (p14ARF), CDKN2A (p16INK4a), CHEK2, CTNNA1, DICER1, EPCAM (Deletion/duplication testing only), GREM1 (promoter region deletion/duplication testing only), KIT, MEN1, MLH1, MSH2, MSH3, MSH6, MUTYH, NBN, NF1, NHTL1, PALB2, PDGFRA, PMS2, POLD1, POLE, PTEN, RAD50, RAD51C, RAD51D, RNF43, SDHB, SDHC, SDHD, SMAD4, SMARCA4. STK11, TP53, TSC1, TSC2, and VHL.  The following genes were evaluated for sequence changes only: SDHA and HOXB13 c.251G>A variant only.    11/17/2018 Pathology Results   Diagnosis 11/17/18 1. Soft tissue, biopsy, Diaphragmatic nodules - METASTATIC ADENOCARCINOMA, CONSISTENT WITH PATIENT'S CLINICAL HISTORY OF CHOLANGIOCARCINOMA. SEE NOTE 2. Liver, biopsy, Left - LIVER PARENCHYMA WITH A BENIGN FIBROTIC NODULE - NO EVIDENCE OF MALIGNANCY 3. Stomach, biopsy - BENIGN PAPILLARY MESOTHELIAL HYPERPLASIA - NO EVIDENCE OF MALIGNANCY   11/29/2018 Imaging   CT CAP WO Contrast  IMPRESSION: 1. Dominant liver mass appears grossly stable from 09/29/2018. Additional liver lesions are too small to characterize but were not shown to be hypermetabolic on PET. 2. Mild nodularity of both adrenal glands with associated hypermetabolism on 09/29/2018. Continued attention on follow-up exams is warranted. 3. Small right lower lobe nodules, stable from 09/07/2018. Again, attention on follow-up is recommended. 4. Trace bilateral pleural fluid. 5. Aortic atherosclerosis (ICD10-170.0). Coronary  artery calcification. 6. Enlarged pulmonic trunk, indicative of pulmonary arterial hypertension.     11/30/2018 - 06/14/2019 Chemotherapy   First line chemo Oxaliplatin and gemcitabine q2weeks starting 11/30/18. Stopped Oxaliplatin on 05/03/19 due to worsening neuropathy. Added Cisplatin with C13 on 05/17/19 and stopped on C15 06/14/19 due to neuropathy. Reduced to maintenance single agent Gemcitabine on 06/28/19.    01/20/2019 Imaging   CT CAP IMPRESSION: restaging  1. Mild interval increase in size of dominant liver mass involving segment 4 and segment 5. 2. No significant or progressive adrenal nodularity identified to suggest metastatic disease. 3. Unchanged appearance of small right lower lobe and lingular lung nodules, nonspecific.   03/21/2019 PET scan   IMPRESSION: 1. Large right hepatic mass with SUV uptake near background hepatic activity, dramatic response to therapy, also with decrease in size when compared to the prior study. 2. Signs of prior right mastectomy and axillary dissection as before. 3. Left adrenal activity in no longer above the level of activity seen in the contralateral, right adrenal gland or in the liver. Attention on follow-up. 4. No new signs of disease.   06/27/2019 PET scan   IMPRESSION: 1. Further decrease in size of a dominant hepatic mass which is non  FDG avid. 2. No evidence of metastatic disease. 3. No evidence of left adrenal hypermetabolism or mass. 4. Incidental findings, including: Uterine fibroids. Coronary artery atherosclerosis. Aortic Atherosclerosis (ICD10-I70.0). Pulmonary artery enlargement suggests pulmonary arterial hypertension.   06/28/2019 -  Chemotherapy   Maintenance single agent Gemcitabine q2weeks starting on 06/28/19 with C16.  ------On chemo break since 12/27/19.  ------She restarted Single Agent Gemcitabine q2weeks on 04/03/20 due to mild disease progression in liver on 02/2020 PET    09/15/2019 PET scan   IMPRESSION: 1. In  the region of regional tumor at the junction of the right and left hepatic lobe, there is continued hypodensity and overall activity slightly less than that of the surrounding normal liver, indicating effective response to therapy. No current worrisome hypermetabolic lesion is identified in the liver or elsewhere. 2. Chronic asymmetric edema in the subcutaneous tissues of the right upper extremity, nonspecific. The patient has had a prior right mastectomy and right axillary dissection. 3. Other imaging findings of potential clinical significance: Aortic Atherosclerosis (ICD10-I70.0). Coronary atherosclerosis. Mild cardiomegaly. Small right and trace left pleural effusions. Subcutaneous and mild mesenteric edema. Suspected uterine fibroids.   12/18/2019 PET scan   IMPRESSION: 1. No significant abnormal activity to suggest active/recurrent malignancy. 2. Lingual tonsillar activity is mildly increased from prior but probably incidental/physiologic, attention on follow up suggested. 3. Other imaging findings of potential clinical significance: Third spacing of fluid with trace ascites, mesenteric edema, subcutaneous edema, and possibly subtle pulmonary edema. Mild cardiomegaly. Aortic Atherosclerosis (ICD10-I70.0). Coronary atherosclerosis. Right ovarian dermoid. Calcified uterine fibroids. Renal cysts. Right glenohumeral arthropathy. Mild to moderate scoliosis. Suspected pulmonary arterial hypertension.   03/14/2020 PET scan   IMPRESSION: 1. New small focus of hypermetabolism in segment 4A of the liver worrisome for new metastatic focus. 2. No other findings for metastatic disease involving the neck, chest, abdomen or pelvis.     06/11/2020 PET scan   IMPRESSION: 1. No suspicious hypermetabolic activity within the neck, chest, abdomen or pelvis. 2. The treated dominant peripheral cholangiocarcinoma appears slightly enlarged compared with recent prior studies, and there is a  questionable new lesion more superiorly in the left lobe. Although without associated hypermetabolic activity, these lesions are suspicious and would be better assessed with abdominal MRI without and with contrast.   09/17/2020 PET scan   IMPRESSION: 1. Recurrent hypermetabolic activity around the periphery of the dominant hepatic mass consistent with local recurrence. This mass appears slightly larger. Possible small metastasis in the dome of the left hepatic lobe is unchanged in size, without hypermetabolic activity. 2. No distant metastases identified. 3. Stable incidental findings as detailed above.   10/16/2020 -  Chemotherapy   Patient is on Treatment Plan : BLADDER Durvalumab q28d     01/02/2021 PET scan   IMPRESSION: 1. Further enlargement of hypermetabolic hepatic mass superior to the cholecystectomy bed consistent with progressive local tumor recurrence. Satellite lesion more superiorly in the anterior dome of the liver has also enlarged, without definite hypermetabolic activity. 2. No distant metastases identified. 3. Stable incidental findings including aortic atherosclerosis, hepatic and renal cysts and uterine fibroids.   07/24/2021 PET scan   IMPRESSION: 1. Interval enlargement of tracer avid confluent mass involving both lobes of liver centered around segment 4. Several new sites of tracer avid disease identified within the liver. 2. No signs of tracer avid nodal metastasis or distant metastatic disease. 3.  Aortic Atherosclerosis (ICD10-I70.0).     ALLERGIES:  has No Known Allergies.  MEDICATIONS:  Current Outpatient Medications  Medication Sig Dispense Refill   DULoxetine (CYMBALTA) 30 MG capsule Take 1 capsule (30 mg total) by mouth daily. 30 capsule 0   amLODipine (NORVASC) 10 MG tablet Take 1 tablet (10 mg total) by mouth daily. 90 tablet 1   aspirin 81 MG tablet Take 81 mg by mouth daily.      Baclofen 5 MG TABS TAKE 1/2 TABLET BY MOUTH ONCE DAILY AS NEEDED  42 tablet 1   blood glucose meter kit and supplies KIT Dispense based on patient and insurance preference. Use up to four times daily as directed. 1 each 0   carvedilol (COREG) 25 MG tablet Take 1 tablet (25 mg total) by mouth 2 (two) times daily with a meal. 180 tablet 3   ezetimibe (ZETIA) 10 MG tablet Take 1 tablet (10 mg total) by mouth daily. 30 tablet 3   furosemide (LASIX) 40 MG tablet TAKE 1 TABLET BY MOUTH EVERY DAY 90 tablet 1   gabapentin (NEURONTIN) 100 MG capsule Take 1 capsule (100 mg total) by mouth every 8 (eight) hours as needed. 180 capsule 2   glucose blood (ONETOUCH VERIO) test strip Use up to 3 times daily to check blood sugar. 100 strip 3   hydrALAZINE (APRESOLINE) 100 MG tablet TAKE THREE TIMES DAILY-AM, MIDDLE OF THE DAY AND IN THE EVENING. 270 tablet 3   HYDROcodone-Acetaminophen 5-300 MG TABS Take 1 tablet by mouth every 12 (twelve) hours as needed. 20 tablet 0   KLOR-CON M10 10 MEQ tablet TAKE 1 TABLET BY MOUTH EVERY DAY 90 tablet 1   lansoprazole (PREVACID) 15 MG capsule Take 1 capsule (15 mg total) by mouth daily at 12 noon. Take 1 capsule by mouth once or twice a day as needed 90 capsule 3   lenvatinib 8 mg daily dose (LENVIMA) 2 x 4 MG capsule Take 2 capsules (8 mg total) by mouth daily. Take as instructed per MD. 60 capsule 0   lidocaine-prilocaine (EMLA) cream Apply 1 application topically as needed. 30 g 1   loratadine (CLARITIN) 10 MG tablet Take 10 mg by mouth daily as needed for allergies.     Omega 3 1000 MG CAPS Take 2,000 mg by mouth daily.      polyethylene glycol (MIRALAX / GLYCOLAX) 17 g packet Take 17 g by mouth daily as needed. 14 each 0   prochlorperazine (COMPAZINE) 10 MG tablet Take 1 tablet (10 mg total) by mouth every 6 (six) hours as needed for nausea or vomiting. 30 tablet 3   sitaGLIPtin (JANUVIA) 50 MG tablet Take 1 tablet (50 mg total) by mouth daily. 90 tablet 1   valsartan (DIOVAN) 320 MG tablet Take 1 tablet (320 mg total) by mouth daily. 90  tablet 3   Current Facility-Administered Medications  Medication Dose Route Frequency Provider Last Rate Last Admin   triamcinolone acetonide (KENALOG) 10 MG/ML injection 10 mg  10 mg Other Once Harriet Masson, DPM        VITAL SIGNS: BP (!) 123/45 (BP Location: Right Arm, Patient Position: Sitting) Comment: Nurse was notify  Pulse 65   Temp 98.6 F (37 C) (Oral)   Resp 15   Wt 135 lb 8 oz (61.5 kg)   LMP  (LMP Unknown)   SpO2 100%   BMI 22.21 kg/m  Filed Weights   09/10/21 1040  Weight: 135 lb 8 oz (61.5 kg)    Estimated body mass index is 22.21 kg/m as calculated from the following:   Height as of 09/01/21:  5' 5.5" (1.664 m).   Weight as of this encounter: 135 lb 8 oz (61.5 kg).  LABS: CBC:    Component Value Date/Time   WBC 8.6 09/01/2021 1011   WBC 6.4 05/02/2021 0730   HGB 10.1 (L) 09/01/2021 1011   HGB CANCELED 07/29/2021 1410   HGB 12.7 11/19/2015 0921   HCT 31.0 (L) 09/01/2021 1011   HCT CANCELED 07/29/2021 1410   HCT 39.8 11/19/2015 0921   PLT 182 09/01/2021 1011   PLT CANCELED 07/29/2021 1410   MCV 90.6 09/01/2021 1011   MCV 87.3 11/19/2015 0921   NEUTROABS 6.9 09/01/2021 1011   NEUTROABS 3.3 11/19/2015 0921   LYMPHSABS 0.7 09/01/2021 1011   LYMPHSABS 2.0 11/19/2015 0921   MONOABS 0.8 09/01/2021 1011   MONOABS 0.6 11/19/2015 0921   EOSABS 0.1 09/01/2021 1011   EOSABS 0.3 11/19/2015 0921   BASOSABS 0.0 09/01/2021 1011   BASOSABS 0.0 11/19/2015 0921   Comprehensive Metabolic Panel:    Component Value Date/Time   NA 140 09/01/2021 1011   NA 141 01/29/2020 0840   NA 144 11/19/2015 0921   K 4.3 09/01/2021 1011   K 4.8 11/19/2015 0921   CL 112 (H) 09/01/2021 1011   CO2 24 09/01/2021 1011   CO2 27 11/19/2015 0921   BUN 28 (H) 09/01/2021 1011   BUN 37 (H) 01/29/2020 0840   BUN 14.4 11/19/2015 0921   CREATININE 1.35 (H) 09/01/2021 1011   CREATININE 1.45 (H) 08/22/2021 1104   CREATININE 1.30 (H) 10/23/2019 1150   CREATININE 1.2 (H) 11/19/2015  0921   GLUCOSE 197 (H) 09/01/2021 1011   GLUCOSE 109 11/19/2015 0921   CALCIUM 8.5 (L) 09/01/2021 1011   CALCIUM 9.8 11/19/2015 0921   AST 31 09/01/2021 1011   AST 34 08/22/2021 1104   AST 20 11/19/2015 0921   ALT 11 09/01/2021 1011   ALT 14 08/22/2021 1104   ALT 14 11/19/2015 0921   ALKPHOS 95 09/01/2021 1011   ALKPHOS 59 11/19/2015 0921   BILITOT 0.5 09/01/2021 1011   BILITOT 0.5 08/22/2021 1104   BILITOT 0.41 11/19/2015 0921   PROT 6.3 (L) 09/01/2021 1011   PROT 7.0 03/28/2021 0842   PROT 7.2 11/19/2015 0921   ALBUMIN 2.9 (L) 09/01/2021 1011   ALBUMIN 4.1 03/28/2021 0842   ALBUMIN 3.6 11/19/2015 0921    RADIOGRAPHIC STUDIES: ECHOCARDIOGRAM COMPLETE  Result Date: 08/19/2021    ECHOCARDIOGRAM REPORT   Patient Name:   TALYN EDDIE Date of Exam: 08/19/2021 Medical Rec #:  [id number]     Height:       65.0 in Accession #:    [id number]    Weight:       132.9 lb Date of Birth:  1940-12-09    BSA:          1.663 m Patient Age:    81 years      BP:           148/52 mmHg Patient Gender: F             HR:           62 bpm. Exam Location:  [address] Procedure: 2D Echo, Cardiac Doppler, Color Doppler and Strain Analysis Indications:    K46.28 Chronic Diastolic Heart Failure  History:        Patient has no prior history of Echocardiogram examinations.                 Risk Factors:Hypertension, Dyslipidemia and Diabetes. Chronic  kidney disease. Breast cancer. Murmur.  Sonographer:    Cresenciano Lick RDCS Referring Phys: [id number] [place]  1. Left ventricular ejection fraction, by estimation, is 60 to 65%. The left ventricle has normal function. The left ventricle has no regional wall motion abnormalities. There is moderate concentric left ventricular hypertrophy. Left ventricular diastolic parameters are consistent with Grade I diastolic dysfunction (impaired relaxation). The average left ventricular global longitudinal strain is -21.9 %. The global  longitudinal strain is normal. No evidence of apical sparing pattern.  2. Right ventricular systolic function is normal. The right ventricular size is normal. There is normal pulmonary artery systolic pressure. The estimated right ventricular systolic pressure is 63.1 mmHg.  3. The mitral valve is normal in structure. No evidence of mitral valve regurgitation. No evidence of mitral stenosis.  4. The aortic valve is tricuspid. Aortic valve regurgitation is not visualized. Comparison(s): Improved RVSP and estimated LAP. FINDINGS  Left Ventricle: Left ventricular ejection fraction, by estimation, is 60 to 65%. The left ventricle has normal function. The left ventricle has no regional wall motion abnormalities. The average left ventricular global longitudinal strain is -21.9 %. The global longitudinal strain is normal. The left ventricular internal cavity size was small. There is moderate concentric left ventricular hypertrophy. Left ventricular diastolic parameters are consistent with Grade I diastolic dysfunction (impaired relaxation). Right Ventricle: The right ventricular size is normal. No increase in right ventricular wall thickness. Right ventricular systolic function is normal. There is normal pulmonary artery systolic pressure. The tricuspid regurgitant velocity is 2.73 m/s, and  with an assumed right atrial pressure of 3 mmHg, the estimated right ventricular systolic pressure is 49.7 mmHg. Left Atrium: Left atrial size was normal in size. Right Atrium: Right atrial size was normal in size. Pericardium: There is no evidence of pericardial effusion. Mitral Valve: The mitral valve is normal in structure. No evidence of mitral valve regurgitation. No evidence of mitral valve stenosis. Tricuspid Valve: The tricuspid valve is normal in structure. Tricuspid valve regurgitation is mild . No evidence of tricuspid stenosis. Aortic Valve: The aortic valve is tricuspid. Aortic valve regurgitation is not visualized.  Pulmonic Valve: The pulmonic valve was normal in structure. Pulmonic valve regurgitation is not visualized. No evidence of pulmonic stenosis. Aorta: The aortic root and ascending aorta are structurally normal, with no evidence of dilitation. IAS/Shunts: No atrial level shunt detected by color flow Doppler.  LEFT VENTRICLE PLAX 2D LVIDd:         3.60 cm   Diastology LVIDs:         2.40 cm   LV e' medial:    5.98 cm/s LV PW:         1.20 cm   LV E/e' medial:  11.1 LV IVS:        1.20 cm   LV e' lateral:   6.09 cm/s LVOT diam:     2.00 cm   LV E/e' lateral: 10.9 LV SV:         65 LV SV Index:   39        2D Longitudinal Strain LVOT Area:     3.14 cm  2D Strain GLS (A2C):   -24.5 %                          2D Strain GLS (A3C):   -20.4 %  2D Strain GLS (A4C):   -20.8 %                          2D Strain GLS Avg:     -21.9 % RIGHT VENTRICLE RV Basal diam:  3.80 cm RV S prime:     19.85 cm/s TAPSE (M-mode): 2.3 cm LEFT ATRIUM           Index        RIGHT ATRIUM           Index LA diam:      3.80 cm 2.29 cm/m   RA Area:     11.80 cm LA Vol (A2C): 33.0 ml 19.85 ml/m  RA Volume:   28.70 ml  17.26 ml/m LA Vol (A4C): 42.6 ml 25.62 ml/m  AORTIC VALVE LVOT Vmax:   94.90 cm/s LVOT Vmean:  59.450 cm/s LVOT VTI:    0.207 m  AORTA Ao Root diam: 2.90 cm Ao Asc diam:  3.10 cm MITRAL VALVE               TRICUSPID VALVE MV Area (PHT): 3.99 cm    TR Peak grad:   29.8 mmHg MV Decel Time: 190 msec    TR Vmax:        273.00 cm/s MV E velocity: 66.10 cm/s MV A velocity: 75.10 cm/s  SHUNTS MV E/A ratio:  0.88        Systemic VTI:  0.21 m                            Systemic Diam: 2.00 cm Rudean Haskell MD Electronically signed by Rudean Haskell MD Signature Date/Time: 08/19/2021/6:26:20 PM    Final     PERFORMANCE STATUS (ECOG) : 2 - Symptomatic, <50% confined to bed  Review of Systems  Constitutional:  Positive for appetite change and fatigue.  Musculoskeletal:  Positive for arthralgias.   Neurological:  Positive for weakness.       Neuropathy   Unless otherwise noted, a complete review of systems is negative.  Physical Exam General: NAD, weak appearing  Cardiovascular: regular rate and rhythm Pulmonary: clear ant fields Abdomen: soft, nontender, + bowel sounds Extremities: bilateral lower extremity edema Skin: no rashes, skin dry and scaly Neurological: Weakness but otherwise nonfocal  IMPRESSION: This is my initial visit with Ms. Wuest. She has no family with her. Reports her daughter dropped her off and had to run other errands. No acute distress noted. Patient is weak appearing. In wheelchair.   I introduced myself, May[place] RN, and Palliative's role in collaboration with the oncology team. Concept of Palliative Care was introduced as specialized medical care for people and their families living with serious illness.  It focuses on providing relief from the symptoms and stress of a serious illness.  The goal is to improve quality of life for both the patient and the family. Values and goals of care important to patient and family were attempted to be elicited.   Patient lives in the home alone. She has 2 children. Her son is retired Engineer, petroleum. She has 5 grandchildren. Her grandson is a wrestler in the [place].   She is able to perform most of her ADLs however this has become more difficult due to her severe neuropathy. She is unable to button up her shirts or pants, unable to twist open containers. Her appetite is minimal as she is unable  to prepare meals as she did before and appetite has decreased. She is requesting assistance with meals on wheels.   Neuropathic Pain Kiyoko is complaining of constant neuropathic pain in her hands and feet. She describes her discomfort as pins and needles with occasional numbness.  Nothing makes this better and she feels like it is getting worse over time.  She is currently taking gabapentin $RemoveBeforeDEI'200mg'[id number]$  at bedtime. Will occasionally  will take an additional dose during the day if she needs to take a nap. Education provided on the use of gabapentin consistently in order for it to be effective.  Discussed continuing to take 200 mg at bedtime and 100 mg in a day with close follow-up.  She does not feel like it helps as much but does allow her to sleep. We discussed use of cymbalta in addition to her gabapentin. She is open to trying.   Will continue to closely monitor.   Goals of Care  We discussed her current illness and what it means in the larger context of Her on-going co-morbidities. Natural disease trajectory and expectations were discussed.  Ms. Coonan is realistic in her understanding of her cancer and poor prognosis.  She reports she is taking things 1 day at a time and allowing God to be in control.  She mentions she has had multiple conversations with her children expressing "maybe 1 day they will come into her house and she will be deceased".  Emotional support provided.  I expressed at length with patient concerns for her living alone, her safety, and wellbeing.  I created space and opportunity for her to discuss conversations that she may have had with her children and their knowledge of the extent of her health.  She continues to become weaker and her ability to provide independent care is becoming minimal.  Her daughter was unfortunately unable to stay for her appointment on today however I will plan to give her a call and further discuss her mother's conditions making sure that they are aware.  Sameria mentions that she is tired and ready to pass away when it is her time.  I empathetically discussed outpatient hospice support as this would be an appropriate decision if patient was in agreement.  She shares she is not interested in going to a hospice facility sharing multiple experiences most recently with her best friend who passed away at [facility].  We discussed each person's situation is different and based on the  experience she shared care seemed appropriate given each individual's poor prognosis.  I assured Ms. Kamrynn of the goals and philosophy of hospice care.  She is aware she does not have to go to a facility if her family is in agreement of it assisting with her care in the home.  In that situation they would be able to come out and offer additional support allow her to stay in her own environment however if symptoms become unmanageable going to their facility is an option.  She verbalized understanding expressing based on our conversations this is something she would possibly consider.  Would need time to further think about things.  I encouraged her to have extensive conversations with her children making sure they are aware of her current condition and need for additional care.  She she plans to speak with them in the next several days.  Advanced directives is on file.  I have personally reviewed.  DNR/DNI confirmed. She has a documented MOST form. We will review at follow-up and make  appropriate changes.   I discussed the importance of continued conversation with family and their medical providers regarding overall plan of care and treatment options, ensuring decisions are within the context of the patients values and GOCs.  PLAN: Establish therapeutic relationship.  Education provided on palliative's role in collaboration with oncology team.   DNR/DNI.  Advanced directives on file.   Ongoing goals of care discussions.  Patient is clear in her expressed wishes to continue to treat the treatable however is preparing for end-of-life when God calls her home.   Gabapentin 200 mg at bedtime and 100 mg daily  Cymbalta 30 mg daily  MiraLAX daily for bowel regimen  Ongoing goals of care discussions.  Highly encourage patient to have discussions with her children making sure they are aware of the extent of her condition, poor prognosis, and need for additional support.  I do not think she will be able to stay at  home by herself much longer.  I will plan to give her daughter a call and further discuss.   I will plan to see patient back in 2-4 weeks in collaboration to other oncology appointments.    Patient expressed understanding and was in agreement with this plan. She also understands that She can call the clinic at any time with any questions, concerns, or complaints.   Thank you for your referral and allowing Palliative to assist in Ms. [place] care.   Number and complexity of problems addressed: 3 HIGH - 1 or more chronic illnesses with SEVERE exacerbation, progression, or side effects of treatment - advanced cancer, pain. Any controlled substances utilized were prescribed in the context of palliative care.  Time Total: 55 min  Visit consisted of counseling and education dealing with the complex and emotionally intense issues of symptom management and palliative care in the setting of serious and potentially life-threatening illness.Greater than 50%  of this time was spent counseling and coordinating care related to the above assessment and plan.  Signed by: Alda Lea, AGPCNP-BC Palliative Medicine Team/[place]

## 2021-09-10 NOTE — Patient Instructions (Signed)
-   Get Vaseline/aquaphor and mix it with a little of hydrocortisone cream and rub it on your legs for itching. Do this 2-3 times a day - take 1 gabapentin at bedtime - pick up Cymbalta prescription and take once a day - pick up some miralax and take twice a day for bowel movements - use your voltaren gel on your hands and feet for some neuropathy relief before bedtime

## 2021-09-10 NOTE — Telephone Encounter (Signed)
Late entry: Specimen Collected: 09/01/21 10:11 done at Cancer center by Dr Burr Medico

## 2021-09-11 ENCOUNTER — Other Ambulatory Visit (HOSPITAL_COMMUNITY): Payer: Self-pay

## 2021-09-11 NOTE — Telephone Encounter (Signed)
Oral Chemotherapy Pharmacist Encounter  I spoke with patient for overview of: Lenvima (lenvatinib) for the treatment of metastatic intrahepatic cholangiocarcinoma, planned duration until disease progression or unacceptable drug toxicity.  Counseled patient on administration, dosing, side effects, monitoring, drug-food interactions, safe handling, storage, and disposal.  Patient will take Lenvima '4mg'$  capsules, patient will take 1 capsule (4 mg total) by mouth once daily for 7 days. If tolerated she will then increase to 2 capsules (8 mg total) by mouth once daily with or without food, at approximately the same time each day if tolerated.  Lenvima start date: 09/14/21  Adverse effects include but are not limited to: hypertension, hand-foot syndrome, diarrhea, joint pain, fatigue, headache, decreased calcium, proteinuria, increased risk of blood clots, and cardiac conduction issues.    Patient will obtain anti diarrheal and alert the office of 4 or more loose stools above baseline.  Patient instructed to notify office of any upcoming invasive procedures.  Donna May will be held for 6 days prior to scheduled surgery, restart based on healing and clinical judgement.   Reviewed with patient importance of keeping a medication schedule and plan for any missed doses. No barriers to medication adherence identified.  Medication reconciliation performed and medication/allergy list updated.  Patient receives medication at no cost through the manufacturer. Medication was delivered to patient's home on 09/11/21.  All questions answered.  Donna May voiced understanding and appreciation.   Medication education handout and medication calendar placed in mail for patient. Patient knows to call the office with questions or concerns. Oral Chemotherapy Clinic phone number provided to patient.   Leron Croak, PharmD, BCPS Hematology/Oncology Clinical Pharmacist [facility] [place] [phone] 09/11/2021 4:27 PM

## 2021-09-12 ENCOUNTER — Telehealth: Payer: Self-pay

## 2021-09-12 NOTE — Progress Notes (Signed)
[place] CSW Progress Note  Clinical Social Worker made referral to the [employer] [facility] [place] for patient to receive Meals on Wheels.  Spoke with [employer] at [phone].  She said there was a waiting list and could not state how long it was.  A social worker will meet with patient to determine if she qualifies.    Rodman Pickle Tanae Petrosky, LCSW

## 2021-09-17 ENCOUNTER — Telehealth: Payer: Self-pay

## 2021-09-17 NOTE — Telephone Encounter (Signed)
Attempted to call pt, LVM  

## 2021-09-23 ENCOUNTER — Encounter: Payer: Self-pay | Admitting: Nurse Practitioner

## 2021-09-23 ENCOUNTER — Other Ambulatory Visit: Payer: Self-pay

## 2021-09-23 ENCOUNTER — Inpatient Hospital Stay: Payer: Medicare Other | Attending: Adult Health

## 2021-09-23 ENCOUNTER — Encounter: Payer: Self-pay | Admitting: Hematology

## 2021-09-23 ENCOUNTER — Inpatient Hospital Stay (HOSPITAL_BASED_OUTPATIENT_CLINIC_OR_DEPARTMENT_OTHER): Payer: Medicare Other | Admitting: Hematology

## 2021-09-23 ENCOUNTER — Inpatient Hospital Stay (HOSPITAL_BASED_OUTPATIENT_CLINIC_OR_DEPARTMENT_OTHER): Payer: Medicare Other | Admitting: Nurse Practitioner

## 2021-09-23 VITALS — BP 136/48 | HR 112 | Temp 98.3°F | Resp 18 | Ht 65.5 in | Wt 132.4 lb

## 2021-09-23 VITALS — BP 136/48 | HR 112 | Temp 98.3°F | Resp 18 | Ht 65.0 in | Wt 132.6 lb

## 2021-09-23 DIAGNOSIS — R630 Anorexia: Secondary | ICD-10-CM

## 2021-09-23 DIAGNOSIS — R531 Weakness: Secondary | ICD-10-CM | POA: Diagnosis not present

## 2021-09-23 DIAGNOSIS — K5900 Constipation, unspecified: Secondary | ICD-10-CM | POA: Diagnosis not present

## 2021-09-23 DIAGNOSIS — T451X5A Adverse effect of antineoplastic and immunosuppressive drugs, initial encounter: Secondary | ICD-10-CM

## 2021-09-23 DIAGNOSIS — Z95828 Presence of other vascular implants and grafts: Secondary | ICD-10-CM

## 2021-09-23 DIAGNOSIS — G620 Drug-induced polyneuropathy: Secondary | ICD-10-CM

## 2021-09-23 DIAGNOSIS — Z515 Encounter for palliative care: Secondary | ICD-10-CM | POA: Diagnosis not present

## 2021-09-23 DIAGNOSIS — C221 Intrahepatic bile duct carcinoma: Secondary | ICD-10-CM

## 2021-09-23 DIAGNOSIS — Z79899 Other long term (current) drug therapy: Secondary | ICD-10-CM | POA: Insufficient documentation

## 2021-09-23 DIAGNOSIS — C786 Secondary malignant neoplasm of retroperitoneum and peritoneum: Secondary | ICD-10-CM | POA: Diagnosis not present

## 2021-09-23 DIAGNOSIS — M792 Neuralgia and neuritis, unspecified: Secondary | ICD-10-CM

## 2021-09-23 DIAGNOSIS — Z7189 Other specified counseling: Secondary | ICD-10-CM | POA: Diagnosis not present

## 2021-09-23 LAB — CBC WITH DIFFERENTIAL (CANCER CENTER ONLY)
Abs Immature Granulocytes: 0.02 10*3/uL (ref 0.00–0.07)
Basophils Absolute: 0 10*3/uL (ref 0.0–0.1)
Basophils Relative: 0 %
Eosinophils Absolute: 0 10*3/uL (ref 0.0–0.5)
Eosinophils Relative: 0 %
HCT: 32.2 % — ABNORMAL LOW (ref 36.0–46.0)
Hemoglobin: 10.3 g/dL — ABNORMAL LOW (ref 12.0–15.0)
Immature Granulocytes: 0 %
Lymphocytes Relative: 10 %
Lymphs Abs: 0.6 10*3/uL — ABNORMAL LOW (ref 0.7–4.0)
MCH: 28.8 pg (ref 26.0–34.0)
MCHC: 32 g/dL (ref 30.0–36.0)
MCV: 89.9 fL (ref 80.0–100.0)
Monocytes Absolute: 0.6 10*3/uL (ref 0.1–1.0)
Monocytes Relative: 10 %
Neutro Abs: 5 10*3/uL (ref 1.7–7.7)
Neutrophils Relative %: 80 %
Platelet Count: 179 10*3/uL (ref 150–400)
RBC: 3.58 MIL/uL — ABNORMAL LOW (ref 3.87–5.11)
RDW: 16.6 % — ABNORMAL HIGH (ref 11.5–15.5)
WBC Count: 6.3 10*3/uL (ref 4.0–10.5)
nRBC: 0 % (ref 0.0–0.2)

## 2021-09-23 LAB — COMPREHENSIVE METABOLIC PANEL
ALT: 13 U/L (ref 0–44)
AST: 56 U/L — ABNORMAL HIGH (ref 15–41)
Albumin: 2.9 g/dL — ABNORMAL LOW (ref 3.5–5.0)
Alkaline Phosphatase: 87 U/L (ref 38–126)
Anion gap: 7 (ref 5–15)
BUN: 39 mg/dL — ABNORMAL HIGH (ref 8–23)
CO2: 24 mmol/L (ref 22–32)
Calcium: 8.9 mg/dL (ref 8.9–10.3)
Chloride: 110 mmol/L (ref 98–111)
Creatinine, Ser: 1.39 mg/dL — ABNORMAL HIGH (ref 0.44–1.00)
GFR, Estimated: 38 mL/min — ABNORMAL LOW (ref >60)
Glucose, Bld: 108 mg/dL — ABNORMAL HIGH (ref 70–99)
Potassium: 4.1 mmol/L (ref 3.5–5.1)
Sodium: 141 mmol/L (ref 135–145)
Total Bilirubin: 0.8 mg/dL (ref 0.3–1.2)
Total Protein: 6.4 g/dL — ABNORMAL LOW (ref 6.5–8.1)

## 2021-09-23 MED ORDER — HEPARIN SOD (PORK) LOCK FLUSH 100 UNIT/ML IV SOLN
500.0000 [IU] | Freq: Once | INTRAVENOUS | Status: AC
  Administered 2021-09-23: 500 [IU]

## 2021-09-23 MED ORDER — SODIUM CHLORIDE 0.9% FLUSH
10.0000 mL | Freq: Once | INTRAVENOUS | Status: AC
  Administered 2021-09-23: 10 mL

## 2021-09-23 NOTE — Progress Notes (Signed)
[place]   [phone] [phone]   Clinic Follow up Note   Patient Care Team: [name], MD as PCP - General (Family Medicine) [name], [name], MD as PCP - Cardiology (Cardiology) [name], [name], MD as Consulting Physician (Cardiology) [name], MD as Consulting Physician (Obstetrics and Gynecology) [name], [name], NP as Nurse Practitioner (Hematology and Oncology) [facility] [name], MD as Consulting Physician (Hematology) [name], [name], MD as Consulting Physician (Gastroenterology) [name], RN as Oncology Nurse Navigator [name], MD as Consulting Physician (General Surgery) [facility] as Transition RN [facility] and Palliative Medicine)  Date of Service:  09/23/2021  CHIEF COMPLAINT: f/u of cholangiocarcinoma  CURRENT THERAPY:  lenvatinib  ASSESSMENT & PLAN:  Donna May is a 81 y.o. female with   1. Intrahepatic cholangiocarcinoma, cT1N0M1, with peritoneal metastasis, MSS, IDH1 mutation (+) -Diagnosed in 09/2018; biopsy of her liver mass shows adenocarcinoma, most consistent with cholangiocarcinoma. 09/29/18 PET scan showed no evidence of distant metastasis. -she was found to have peritoneal metastasis to right diaphragm and surgery was aborted. -Her CT AP from 11/29/18 shows dominate liver mass stable to mild increase, mild nodularity of both adrenal glands which warrants being monitored.  No visible peritoneal metastasis on CT scan. -FO results showed MSI stable disease, IDH mutation positive.  -She was on first-line single agent gemcitabine intermittently for about 2 years, was initially briefly on oxaliplatin but stopped it due to severe neuropathy. -she was started on ivosidenib 500 mg daily on 02/25/21 while awaiting Y90.  -CT AP 03/07/21 showed mild cancer progression in liver, no other new mets -she underwent left lobe Y90 on 05/02/21; she tolerated well  overall. Dr. Pascal Lux does not plan for additional therapy given inability to cauterize left hepatic artery.  -due to worsening neuropathy, we held her ivosidenib since 06/06/21. Despite this, she continues to have worsened neuropathy, stable since coming off the ivosidenib. -PET scan on 07/24/21 showed: interval enlargement of tracer-avid confluent mass involving both lobes of liver; several new sites tracer-avid disease within liver; no tracer-avid nodal or distant metastasis.  -she began [place] on 08/14/21, but she tolerated poorly and stopped after a few days. -treatment was switched to lenvatinib, she developed diarrhea and gastric pain after first dose and stopped. -I called her daughter, and had a long conversation with patient and her daughter regarding her current status.  I recommend home hospice care due to her worsening overall condition, poor performance status, and lack of standard treatment options.  She initially declined hospice, her daughter was open to it.  She is scheduled to see our palliative care nurse practitioner [facility] today.  I encouraged her to discuss with the rest of her family.  2. Goal of care discussion, DNR -We frequently discuss the goal of her treatment is palliative and that her cancer is not curable with chemo alone at this stage. The goal is to control her disease, prolong her life, and give her good quality of life. She understands -she agreed to DNR/DNI on 07/31/21. -I recommend she see our palliative care NP Nikki to help manage her symptoms; she is agreeable.   3. Grade 3 CIPN  -secondary to previous Oxaliplatin and cisplatin. Her diabetes may contribute to it also. -low efficacy of gabapentin and cymbalta, also tried lyrica briefly. She completed PT for balance. She uses gabapentin as needed, mainly for sleep. -Follow-up open with Dr. Mickeal Skinner, pt did not find helpful. Also seen by chiropractor. We previously  discussed some alternative treatment options. -she  experienced worsening tingling on ivosidenib and stivarga.   4. H/o right breast cancer, Genetics negative  -s/p mastectomy in 1992, per patient did not require adjuvant therapy -previously followed by Dr. Jana Hakim  -VUS of gene APC; genetics otherwise normal -she has continued right arm lymphedema from surgery.   5. Comorbidities: DM, HTN, hiatal hernia, GERD, renal artery stenosis, CHF -Follow-up with PCP, nephrology, and Dr. Dwyane Dee, and cardiology -stable, well controlled   6. CKD Stage III -Scr previously stable at 1.3 - 1.6 -worsened prior to Y90 procedure, reaching 2.25 the day of procedure.  -stable at 1.35 today (09/01/21)     PLAN: -She has stopped lenvatinib after first dose due to side effects, although patient wants to try again, my recommendation is to stop. -I recommend home hospice care, she will discuss with palliative care NP [facility] today. -will f/u her in 3 weeks if she does not enroll to hospice    No problem-specific Assessment & Plan notes found for this encounter.   SUMMARY OF ONCOLOGIC HISTORY: Oncology History Overview Note  Cancer Staging Intrahepatic cholangiocarcinoma ([place]) Staging form: Intrahepatic Bile Duct, AJCC 8th Edition - Clinical stage from 09/23/2018: Stage IB (cT1b, cN0, cM0) - Signed by [name], MD on 10/06/2018 Histologic grade (G): G3 Histologic grading system: 3 grade system    Intrahepatic cholangiocarcinoma ([place])  09/07/2018 Imaging   CT Chest IMPRESSION: 1. New, enhancing mass involving segment 4 of the liver and fundus of gallbladder is concerning for malignancy. This may represent either metastatic disease from breast cancer or neoplasm primary to the liver or hepatic biliary tree. Further evaluation with contrast enhanced CT of the abdomen and pelvis is recommended. 2. No findings to suggest metastatic disease within the chest. 3.  Aortic Atherosclerosis (ICD10-I70.0). 4. Coronary artery calcifications.   09/13/2018 Pathology  Results   Diagnosis Liver, needle/core biopsy - ADENOCARCINOMA. Microscopic Comment Immunohistochemistry for CK7 is positive. CK20, TTF1, CDX-2, GATA-3, PAX 8, Qualitative ER, p63 and CK5/6 are negative. The provided clinical history of remote mammary carcinoma is noted. Based on the morphology and immunophenotype of the adenocarcinoma observed in this specimen, primary cholangiocarcinoma is favored. Clinical and radiologic correlation are  encouraged. Results reported to [employer] on 09/15/2018. Intradepartmental consultation (Dr. Vic Ripper).   09/13/2018 Initial Diagnosis   Cholangiocarcinoma ([place])   09/23/2018 Procedure   Colonoscopy by Dr. Havery Moros 09/23/18  IMPRESSION - Two 3 to 4 mm polyps in the ascending colon, removed with a cold snare. Resected and retrieved. - Five 3 to 5 mm polyps in the transverse colon, removed with a cold snare. Resected and retrieved. - One 5 mm polyp at the splenic flexure, removed with a cold snare. Resected and retrieved. - Three 3 to 5 mm polyps in the sigmoid colon, removed with a cold snare. Resected and retrieved. - The examination was otherwise normal. Upper Endopscy by Dr. Havery Moros 09/23/18  IMPRESSION - Esophagogastric landmarks identified. - 2 cm hiatal hernia. - Normal esophagus otherwise. - A single gastric polyp. Resected and retrieved. - Mild gastritis. Biopsied. - Normal duodenal bulb and second portion of the duodenum.   09/23/2018 Pathology Results   Diagnosis 09/23/18 1. Surgical [P], duodenum - BENIGN SMALL BOWEL MUCOSA. - NO ACTIVE INFLAMMATION OR VILLOUS ATROPHY IDENTIFIED. 2. Surgical [P], stomach, polyp - HYPERPLASTIC POLYP(S). - THERE IS NO EVIDENCE OF MALIGNANCY. 3. Surgical [P], gastric antrum and gastric body - CHRONIC INACTIVE GASTRITIS. - THERE IS NO EVIDENCE OF HELICOBACTER-PYLORI, DYSPLASIA, OR MALIGNANCY. -  SEE COMMENT. 4. Surgical [P], colon, sigmoid, splenic flexure, transverse and ascending, polyp (9) -  TUBULAR ADENOMA(S). - SESSILE SERRATED POLYP WITHOUT CYTOLOGIC DYSPLASIA. - HIGH GRADE DYSPLASIA IS NOT IDENTIFIED. 5. Surgical [P], colon, sigmoid, polyp (2) - HYPERPLASTIC POLYP(S). - THERE IS NO EVIDENCE OF MALIGNANCY.   09/23/2018 Cancer Staging   Staging form: Intrahepatic Bile Duct, AJCC 8th Edition - Clinical stage from 09/23/2018: Stage IB (cT1b, cN0, cM0) - Signed by [name], MD on 10/06/2018   09/29/2018 PET scan   PET 09/29/18 IMPRESSION: 1. Hypermetabolic mass in the RIGHT hepatic lobe consistent with biopsy proven adenocarcinoma. No additional liver metastasis. 2. No evidence of local breast cancer recurrence in the RIGHT breast or RIGHT axilla. 3. Mild bilateral hypermetabolic adrenal glands is favored benign hyperplasia. 4. No evidence of additional metastatic disease on skull base to thigh FDG PET scan.   11/06/2018 Genetic Testing   Negative genetic testing on the [employer] Common Hereditary Cancers panel. A variant of uncertain significance was identified in one of her APC genes, called c.1243G>A (p.Ala415Thr).  The Common Hereditary Cancers Panel offered by [employer] includes sequencing and/or deletion duplication testing of the following 48 genes: APC, ATM, AXIN2, BARD1, BMPR1A, BRCA1, BRCA2, BRIP1, CDH1, CDK4, CDKN2A (p14ARF), CDKN2A (p16INK4a), CHEK2, CTNNA1, DICER1, EPCAM (Deletion/duplication testing only), GREM1 (promoter region deletion/duplication testing only), KIT, MEN1, MLH1, MSH2, MSH3, MSH6, MUTYH, NBN, NF1, NHTL1, PALB2, PDGFRA, PMS2, POLD1, POLE, PTEN, RAD50, RAD51C, RAD51D, RNF43, SDHB, SDHC, SDHD, SMAD4, SMARCA4. STK11, TP53, TSC1, TSC2, and VHL.  The following genes were evaluated for sequence changes only: SDHA and HOXB13 c.251G>A variant only.    11/17/2018 Pathology Results   Diagnosis 11/17/18 1. Soft tissue, biopsy, Diaphragmatic nodules - METASTATIC ADENOCARCINOMA, CONSISTENT WITH PATIENT'S CLINICAL HISTORY OF CHOLANGIOCARCINOMA. SEE NOTE 2. Liver,  biopsy, Left - LIVER PARENCHYMA WITH A BENIGN FIBROTIC NODULE - NO EVIDENCE OF MALIGNANCY 3. Stomach, biopsy - BENIGN PAPILLARY MESOTHELIAL HYPERPLASIA - NO EVIDENCE OF MALIGNANCY   11/29/2018 Imaging   CT CAP WO Contrast  IMPRESSION: 1. Dominant liver mass appears grossly stable from 09/29/2018. Additional liver lesions are too small to characterize but were not shown to be hypermetabolic on PET. 2. Mild nodularity of both adrenal glands with associated hypermetabolism on 09/29/2018. Continued attention on follow-up exams is warranted. 3. Small right lower lobe nodules, stable from 09/07/2018. Again, attention on follow-up is recommended. 4. Trace bilateral pleural fluid. 5. Aortic atherosclerosis (ICD10-170.0). Coronary artery calcification. 6. Enlarged pulmonic trunk, indicative of pulmonary arterial hypertension.     11/30/2018 - 06/14/2019 Chemotherapy   First line chemo Oxaliplatin and gemcitabine q2weeks starting 11/30/18. Stopped Oxaliplatin on 05/03/19 due to worsening neuropathy. Added Cisplatin with C13 on 05/17/19 and stopped on C15 06/14/19 due to neuropathy. Reduced to maintenance single agent Gemcitabine on 06/28/19.    01/20/2019 Imaging   CT CAP IMPRESSION: restaging  1. Mild interval increase in size of dominant liver mass involving segment 4 and segment 5. 2. No significant or progressive adrenal nodularity identified to suggest metastatic disease. 3. Unchanged appearance of small right lower lobe and lingular lung nodules, nonspecific.   03/21/2019 PET scan   IMPRESSION: 1. Large right hepatic mass with SUV uptake near background hepatic activity, dramatic response to therapy, also with decrease in size when compared to the prior study. 2. Signs of prior right mastectomy and axillary dissection as before. 3. Left adrenal activity in no longer above the level of activity seen in the contralateral, right adrenal gland or in the liver. Attention  on follow-up. 4. No  new signs of disease.   06/27/2019 PET scan   IMPRESSION: 1. Further decrease in size of a dominant hepatic mass which is non FDG avid. 2. No evidence of metastatic disease. 3. No evidence of left adrenal hypermetabolism or mass. 4. Incidental findings, including: Uterine fibroids. Coronary artery atherosclerosis. Aortic Atherosclerosis (ICD10-I70.0). Pulmonary artery enlargement suggests pulmonary arterial hypertension.   06/28/2019 -  Chemotherapy   Maintenance single agent Gemcitabine q2weeks starting on 06/28/19 with C16.  ------On chemo break since 12/27/19.  ------She restarted Single Agent Gemcitabine q2weeks on 04/03/20 due to mild disease progression in liver on 02/2020 PET    09/15/2019 PET scan   IMPRESSION: 1. In the region of regional tumor at the junction of the right and left hepatic lobe, there is continued hypodensity and overall activity slightly less than that of the surrounding normal liver, indicating effective response to therapy. No current worrisome hypermetabolic lesion is identified in the liver or elsewhere. 2. Chronic asymmetric edema in the subcutaneous tissues of the right upper extremity, nonspecific. The patient has had a prior right mastectomy and right axillary dissection. 3. Other imaging findings of potential clinical significance: Aortic Atherosclerosis (ICD10-I70.0). Coronary atherosclerosis. Mild cardiomegaly. Small right and trace left pleural effusions. Subcutaneous and mild mesenteric edema. Suspected uterine fibroids.   12/18/2019 PET scan   IMPRESSION: 1. No significant abnormal activity to suggest active/recurrent malignancy. 2. Lingual tonsillar activity is mildly increased from prior but probably incidental/physiologic, attention on follow up suggested. 3. Other imaging findings of potential clinical significance: Third spacing of fluid with trace ascites, mesenteric edema, subcutaneous edema, and possibly subtle pulmonary edema. Mild  cardiomegaly. Aortic Atherosclerosis (ICD10-I70.0). Coronary atherosclerosis. Right ovarian dermoid. Calcified uterine fibroids. Renal cysts. Right glenohumeral arthropathy. Mild to moderate scoliosis. Suspected pulmonary arterial hypertension.   03/14/2020 PET scan   IMPRESSION: 1. New small focus of hypermetabolism in segment 4A of the liver worrisome for new metastatic focus. 2. No other findings for metastatic disease involving the neck, chest, abdomen or pelvis.     06/11/2020 PET scan   IMPRESSION: 1. No suspicious hypermetabolic activity within the neck, chest, abdomen or pelvis. 2. The treated dominant peripheral cholangiocarcinoma appears slightly enlarged compared with recent prior studies, and there is a questionable new lesion more superiorly in the left lobe. Although without associated hypermetabolic activity, these lesions are suspicious and would be better assessed with abdominal MRI without and with contrast.   09/17/2020 PET scan   IMPRESSION: 1. Recurrent hypermetabolic activity around the periphery of the dominant hepatic mass consistent with local recurrence. This mass appears slightly larger. Possible small metastasis in the dome of the left hepatic lobe is unchanged in size, without hypermetabolic activity. 2. No distant metastases identified. 3. Stable incidental findings as detailed above.   10/16/2020 -  Chemotherapy   Patient is on Treatment Plan : BLADDER Durvalumab q28d     01/02/2021 PET scan   IMPRESSION: 1. Further enlargement of hypermetabolic hepatic mass superior to the cholecystectomy bed consistent with progressive local tumor recurrence. Satellite lesion more superiorly in the anterior dome of the liver has also enlarged, without definite hypermetabolic activity. 2. No distant metastases identified. 3. Stable incidental findings including aortic atherosclerosis, hepatic and renal cysts and uterine fibroids.   07/24/2021 PET scan    IMPRESSION: 1. Interval enlargement of tracer avid confluent mass involving both lobes of liver centered around segment 4. Several new sites of tracer avid disease identified within the liver. 2. No signs of  tracer avid nodal metastasis or distant metastatic disease. 3.  Aortic Atherosclerosis (ICD10-I70.0).      INTERVAL HISTORY:  SHARMIN FOULK is here for a follow up of cholangiocarcinoma. She was last seen by me 3 weeks ago. She did try Lenvatinib 41m one week ago, and developed diarrhea  and gastric discomfort, so she stopped. She is not sure if she is having some gastric virus. She has been in bed most of time at home, but is able to use bath room etc. She walks with a crane at home. She has had a few episodes of stool incontinence, no fall lately.    All other systems were reviewed with the patient and are negative.  MEDICAL HISTORY:  Past Medical History:  Diagnosis Date   Anemia    Anxiety    Arthritis    Breast cancer ([place] 1992   right   Cataract    Bilateral eyes - surgery to remove   Chronic diastolic CHF (congestive heart failure) [place]    Chronic kidney disease    CKD stage 3   Complication of anesthesia    "something they use make me itch for a couple of days."   Depression    Diabetes mellitus    type 2   Duodenitis 01/18/2002   Fainting spell    GERD (gastroesophageal reflux disease)    Heart murmur    never has caused any problems   Hiatal hernia 08/08/2008, 01/18/2002   History of pneumonia    x 2   Hyperlipidemia    Hypertension    [place] cancer ([place] 08[place]   chemotherapy   Lymphedema 2017   Right arm   Peripheral neuropathy     SURGICAL HISTORY: Past Surgical History:  Procedure Laterality Date   AXILLARY SURGERY     cyst removal, right   COLONOSCOPY  09/23/2018   Dr. AHavery Moros- polyps   EYE SURGERY Bilateral    cataracts to remove   IR 3D INDEPENDENT WKST  05/02/2021   IR ANGIOGRAM SELECTIVE EACH ADDITIONAL VESSEL  04/18/2021   IR  ANGIOGRAM SELECTIVE EACH ADDITIONAL VESSEL  04/18/2021   IR ANGIOGRAM SELECTIVE EACH ADDITIONAL VESSEL  05/02/2021   IR ANGIOGRAM SELECTIVE EACH ADDITIONAL VESSEL  05/02/2021   IR ANGIOGRAM SELECTIVE EACH ADDITIONAL VESSEL  05/02/2021   IR ANGIOGRAM SELECTIVE EACH ADDITIONAL VESSEL  05/02/2021   IR ANGIOGRAM SELECTIVE EACH ADDITIONAL VESSEL  05/02/2021   IR ANGIOGRAM VISCERAL SELECTIVE  04/18/2021   IR ANGIOGRAM VISCERAL SELECTIVE  05/02/2021   IR EMBO TUMOR ORGAN ISCHEMIA INFARCT INC GUIDE ROADMAPPING  05/02/2021   IR RADIOLOGIST EVAL & MGMT  01/30/2021   IR RADIOLOGIST EVAL & MGMT  05/22/2021   IR RADIOLOGIST EVAL & MGMT  08/08/2021   IR [place] ACC[place] RIGHT  04/18/2021   IR [place] V[place] ACCESS RIGHT  05/02/2021   LAPAROSCOPY N/A 11/17/2018   Procedure: LAPAROSCOPY DIAGNOSTIC, INTRAOPERATIVE ULTRASOUND, PERITONEAL BIOPSIES;  Surgeon: BStark Klein MD;  [place]  Service: General;  Laterality: N/A;  GENERAL AND EPIDURAL   LIVER BIOPSY  08/2018   Dr. MLindwood Coke  MASTECTOMY  1992   right, with flap   [place] 06/11/[place]   Protocol:[place], post stress EF58%, EKG negative for ischemia, low risk   PORTACATH PLACEMENT N/A 12/02/2018   Procedure: INSERTION PORT-A-CATH;  Surgeon: BStark Klein MD;  [place]  Service: General;  Laterality: N/A;   RECONSTRUCTION BREAST W/ TRAM FLAP Right  TONSILLECTOMY  1958   TRANSTHORACIC ECHOCARDIOGRAM  12/24/2009   LVEF =>55%, normal study   UPPER GI ENDOSCOPY      I have reviewed the social history and family history with the patient and they are unchanged from previous note.  ALLERGIES:  has No Known Allergies.  MEDICATIONS:  Current Outpatient Medications  Medication Sig Dispense Refill   amLODipine (NORVASC) 10 MG tablet Take 1 tablet (10 mg total) by mouth daily. 90 tablet 1   aspirin 81 MG tablet Take 81 mg by mouth daily.      Baclofen 5 MG TABS TAKE 1/2 TABLET BY MOUTH ONCE DAILY AS NEEDED 42 tablet 1   blood glucose  meter kit and supplies KIT Dispense based on patient and insurance preference. Use up to four times daily as directed. 1 each 0   carvedilol (COREG) 25 MG tablet Take 1 tablet (25 mg total) by mouth 2 (two) times daily with a meal. 180 tablet 3   DULoxetine (CYMBALTA) 30 MG capsule Take 1 capsule (30 mg total) by mouth daily. 30 capsule 0   ezetimibe (ZETIA) 10 MG tablet Take 1 tablet (10 mg total) by mouth daily. 30 tablet 3   furosemide (LASIX) 40 MG tablet TAKE 1 TABLET BY MOUTH EVERY DAY 90 tablet 1   gabapentin (NEURONTIN) 100 MG capsule Take 1 capsule (100 mg total) by mouth every 8 (eight) hours as needed. 180 capsule 2   glucose blood (ONETOUCH VERIO) test strip Use up to 3 times daily to check blood sugar. 100 strip 3   hydrALAZINE (APRESOLINE) 100 MG tablet TAKE THREE TIMES DAILY-AM, MIDDLE OF THE DAY AND IN THE EVENING. 270 tablet 3   HYDROcodone-Acetaminophen 5-300 MG TABS Take 1 tablet by mouth every 12 (twelve) hours as needed. 20 tablet 0   KLOR-CON M10 10 MEQ tablet TAKE 1 TABLET BY MOUTH EVERY DAY 90 tablet 1   lansoprazole (PREVACID) 15 MG capsule Take 1 capsule (15 mg total) by mouth daily at 12 noon. Take 1 capsule by mouth once or twice a day as needed 90 capsule 3   lenvatinib 8 mg daily dose (LENVIMA) 2 x 4 MG capsule Take 2 capsules (8 mg total) by mouth daily. Take as instructed per MD. 60 capsule 0   lidocaine-prilocaine (EMLA) cream Apply 1 application topically as needed. 30 g 1   loratadine (CLARITIN) 10 MG tablet Take 10 mg by mouth daily as needed for allergies.     Omega 3 1000 MG CAPS Take 2,000 mg by mouth daily.      polyethylene glycol (MIRALAX / GLYCOLAX) 17 g packet Take 17 g by mouth daily as needed. 14 each 0   prochlorperazine (COMPAZINE) 10 MG tablet Take 1 tablet (10 mg total) by mouth every 6 (six) hours as needed for nausea or vomiting. 30 tablet 3   sitaGLIPtin (JANUVIA) 50 MG tablet Take 1 tablet (50 mg total) by mouth daily. 90 tablet 1   valsartan  (DIOVAN) 320 MG tablet Take 1 tablet (320 mg total) by mouth daily. 90 tablet 3   Current Facility-Administered Medications  Medication Dose Route Frequency Provider Last Rate Last Admin   triamcinolone acetonide (KENALOG) 10 MG/ML injection 10 mg  10 mg Other Once Harriet Masson, DPM        PHYSICAL EXAMINATION: ECOG PERFORMANCE STATUS: 3  Vitals:   09/23/21 1212  BP: (!) 136/48  Pulse: (!) 112  Resp: 18  Temp: 98.3 F (36.8 C)  SpO2: 100%  Wt Readings from Last 3 Encounters:  09/23/21 132 lb 9.6 oz (60.1 kg)  09/23/21 132 lb 6.4 oz (60.1 kg)  09/10/21 135 lb 8 oz (61.5 kg)     GENERAL:alert, no distress and comfortable SKIN: skin color normal, no rashes or significant lesions EYES: normal, Conjunctiva are pink and non-injected, sclera clear  NEURO: alert & oriented x 3 with fluent speech  LABORATORY DATA:  I have reviewed the data as listed    Latest Ref Rng & Units 09/23/2021   12:02 PM 09/01/2021   10:11 AM 08/22/2021   10:13 AM  CBC  WBC 4.0 - 10.5 K/uL 6.3  8.6  7.9   Hemoglobin 12.0 - 15.0 g/dL 10.3  10.1  10.8   Hematocrit 36.0 - 46.0 % 32.2  31.0  33.3   Platelets 150 - 400 K/uL 179  182  192         Latest Ref Rng & Units 09/23/2021   12:02 PM 09/01/2021   10:11 AM 08/22/2021   11:04 AM  CMP  Glucose 70 - 99 mg/dL 108  197  124   BUN 8 - 23 mg/dL 39  28  49   Creatinine 0.44 - 1.00 mg/dL 1.39  1.35  1.45   Sodium 135 - 145 mmol/L 141  140  139   Potassium 3.5 - 5.1 mmol/L 4.1  4.3  4.3   Chloride 98 - 111 mmol/L 110  112  111   CO2 22 - 32 mmol/L _0 Calcium 8.9 - 10.3 mg/dL 8.9  8.5  8.6   Total Protein 6.5 - 8.1 g/dL 6.4  6.3  6.5   Total Bilirubin 0.3 - 1.2 mg/dL 0.8  0.5  0.5   Alkaline Phos 38 - 126 U/L 87  95  96   AST 15 - 41 U/L 56  31  34   ALT 0 - 44 U/L _1 RADIOGRAPHIC STUDIES: I have personally reviewed the radiological images as listed and agreed with the findings in the report. No results found.    No  orders of the defined types were placed in this encounter.  All questions were answered. The patient knows to call the clinic with any problems, questions or concerns. No barriers to learning was detected. The total time spent in the appointment was 40 minutes.     [name], MD 09/23/2021

## 2021-09-23 NOTE — Progress Notes (Signed)
[place]  [phone] [phone]   Name: Donna May Date: 09/23/2021 MRN: [id number]  DOB: [birth year]  Patient Care Team: [name], MD as PCP - General (Family Medicine) [name], [name], MD as PCP - Cardiology (Cardiology) [name], [name], MD as Consulting Physician (Cardiology) [name], MD as Consulting Physician (Obstetrics and Gynecology) [name], [name], NP as Nurse Practitioner (Hematology and Oncology) [facility] [name], MD as Consulting Physician (Hematology) [name], [name], MD as Consulting Physician (Gastroenterology) [name], RN as Oncology Nurse Navigator [name], MD as Consulting Physician (General Surgery) [facility] as Transition RN (Hospice and Palliative Medicine)    INTERVAL HISTORY: Donna May is a 81 y.o. female with  medical history including cholangiocarcinoma with peritoneal metastasis (09/2018) s/p Y90 (04/2021), chemotherapy, currently on oral chemotherapy. Not a candidate for intravenous treatment .  Palliative ask to see for symptom management and goals of care.   SOCIAL HISTORY:     reports that she quit smoking about 33 years ago. Her smoking use included cigarettes. She has a 6.25 pack-year smoking history. She has never used smokeless tobacco. She reports that she does not currently use alcohol. She reports that she does not use drugs.  ADVANCE DIRECTIVES:  Patient has a documented advanced directive on file. Her daughter and son are listed as her primary decision makers. DNR/DNI confirmed.   CODE STATUS: DNR  PAST MEDICAL HISTORY: Past Medical History:  Diagnosis Date   Anemia    Anxiety    Arthritis    Breast cancer ([place]) 1992   right   Cataract    Bilateral eyes - surgery to remove   Chronic diastolic CHF (congestive heart failure) (HCC)    Chronic kidney disease    CKD stage 3    Complication of anesthesia    "something they use make me itch for a couple of days."   Depression    Diabetes mellitus    type 2   Duodenitis 01/18/2002   Fainting spell    GERD (gastroesophageal reflux disease)    Heart murmur    never has caused any problems   Hiatal hernia 08/08/2008, 01/18/2002   History of pneumonia    x 2   Hyperlipidemia    Hypertension    Liver cancer ([place]) 08/2018   chemotherapy   Lymphedema 2017   Right arm   Peripheral neuropathy     ALLERGIES:  has No Known Allergies.  MEDICATIONS:  Current Outpatient Medications  Medication Sig Dispense Refill   amLODipine (NORVASC) 10 MG tablet Take 1 tablet (10 mg total) by mouth daily. 90 tablet 1   aspirin 81 MG tablet Take 81 mg by mouth daily.      Baclofen 5 MG TABS TAKE 1/2 TABLET BY MOUTH ONCE DAILY AS NEEDED 42 tablet 1   blood glucose meter kit and supplies KIT Dispense based on patient and insurance preference. Use up to four times daily as directed. 1 each 0   carvedilol (COREG) 25 MG tablet Take 1 tablet (25 mg total) by mouth 2 (two) times daily with a meal. 180 tablet 3   DULoxetine (CYMBALTA) 30 MG capsule Take 1 capsule (30 mg total) by mouth daily. 30 capsule 0   ezetimibe (ZETIA) 10 MG tablet Take 1 tablet (10 mg total) by mouth daily. 30 tablet 3   furosemide (LASIX) 40 MG tablet TAKE 1 TABLET BY MOUTH EVERY DAY 90 tablet 1  gabapentin (NEURONTIN) 100 MG capsule Take 1 capsule (100 mg total) by mouth every 8 (eight) hours as needed. 180 capsule 2   glucose blood (ONETOUCH VERIO) test strip Use up to 3 times daily to check blood sugar. 100 strip 3   hydrALAZINE (APRESOLINE) 100 MG tablet TAKE THREE TIMES DAILY-AM, MIDDLE OF THE DAY AND IN THE EVENING. 270 tablet 3   HYDROcodone-Acetaminophen 5-300 MG TABS Take 1 tablet by mouth every 12 (twelve) hours as needed. 20 tablet 0   KLOR-CON M10 10 MEQ tablet TAKE 1 TABLET BY MOUTH EVERY DAY 90 tablet 1   lansoprazole (PREVACID) 15 MG capsule Take 1  capsule (15 mg total) by mouth daily at 12 noon. Take 1 capsule by mouth once or twice a day as needed 90 capsule 3   lenvatinib 8 mg daily dose (LENVIMA) 2 x 4 MG capsule Take 2 capsules (8 mg total) by mouth daily. Take as instructed per MD. 60 capsule 0   lidocaine-prilocaine (EMLA) cream Apply 1 application topically as needed. 30 g 1   loratadine (CLARITIN) 10 MG tablet Take 10 mg by mouth daily as needed for allergies.     Omega 3 1000 MG CAPS Take 2,000 mg by mouth daily.      polyethylene glycol (MIRALAX / GLYCOLAX) 17 g packet Take 17 g by mouth daily as needed. 14 each 0   prochlorperazine (COMPAZINE) 10 MG tablet Take 1 tablet (10 mg total) by mouth every 6 (six) hours as needed for nausea or vomiting. 30 tablet 3   sitaGLIPtin (JANUVIA) 50 MG tablet Take 1 tablet (50 mg total) by mouth daily. 90 tablet 1   valsartan (DIOVAN) 320 MG tablet Take 1 tablet (320 mg total) by mouth daily. 90 tablet 3   Current Facility-Administered Medications  Medication Dose Route Frequency Provider Last Rate Last Admin   triamcinolone acetonide (KENALOG) 10 MG/ML injection 10 mg  10 mg Other Once Harriet Masson, DPM        VITAL SIGNS: BP (!) 136/48 (Patient Position: Sitting) Comment: Vitals from [place] Visit  Pulse (!) 112 Comment: vitals from [place] visit  Temp 98.3 F (36.8 C) (Oral)   Resp 18   Ht _0  (1.651 m)   Wt 132 lb 9.6 oz (60.1 kg)   LMP  (LMP Unknown)   SpO2 100%   BMI 22.07 kg/m  Filed Weights   09/23/21 1257  Weight: 132 lb 9.6 oz (60.1 kg)    Estimated body mass index is 22.07 kg/m as calculated from the following:   Height as of this encounter: _1  (1.651 m).   Weight as of this encounter: 132 lb 9.6 oz (60.1 kg).   PERFORMANCE STATUS (ECOG) : 2 - Symptomatic, <50% confined to bed   Physical Exam General: NAD, weak appearing  Cardiovascular: regular rate and rhythm Pulmonary: diminished  Abdomen: soft, nontender, + bowel sounds Extremities: no edema, no joint  deformities Skin: no rashes Neurological: Weakness but otherwise nonfocal  IMPRESSION: Donna May presents to the clinic for ongoing support and symptom management needs. She is in a wheelchair. No family present however her daughter is engaged in discussions via phone. Weak appearing. No acute distress noted. Is able to engage in discussions appropriately.  Patient continues to speak difficulty in preparing meals and completing all ADLs due to her neuropathy and ongoing weakness.  Her brother is coming over to the house to provide care multiple times throughout the week and assist with errands.  Neuropathic  Pain Ongoing neuropathic pain in hands and feet. She describes her discomfort as pins and needles with occasional numbness.  Nothing makes this better and she feels like it is getting worse over time. Is tolerating gabapentin however is not using as prescribed. She does confirm nightly dose however does not take day time dose as scheduled or daily.   Education provided on use of medication. She is tolerating Cymbalta and reports some noticeable improvement. We will continue to closely monitor.    Goals of Care  Ongoing goals of care discussions.  Daughter received detailed updates from Dr. Burr Medico today as well as myself.  She is aware of recommendations to consider hospice support focusing on patient's comfort for what time she has left.  Both daughter and patient expressed concerns with hospice given previous experiences.  We discussed at length there encounters.  I attempted to answer all questions and concerns as best possible.  Daughter verbalized appreciation expressing she is somewhat more comfortable with consideration of hospice knowing her mother would be able to remain at home with the proper support.  Donna May verbalized her understanding sharing she would like some time to think about it but is strongly considering now that she has a better understanding and picture of what care would  look like.  I assured patient that she can remain in her home with the proper family support in addition to hospice.  She is afraid of being placed at residential hospice and passing away quickly.  Her wishes would be to remain at home if at all possible with understanding if symptoms become unmanageable or life expectancy becomes within days or weeks she would then be more open with her family making decisions in regards to transferring to a residential hospice facility with [facility] as preference.  Donna May states she lives 5 minutes away from their facility.  I shared openly with daughters concerns with patient living alone and increased need for support and care.  She verbalized understanding expressing they plan to have a family discussion later today or tomorrow to include patient's brother and son.  As requested information sent home on outpatient hospice and palliative services.  They plan to contact our office with any further decisions.  They are in agreement with outpatient palliative via [facility].  Donna May concerned she would not be able to see our team and I explained to her that myself in addition to [facility] palliative would be involved.  They verbalized understanding and appreciation.  09/10/21: We discussed Her current illness and what it means in the larger context of Her on-going co-morbidities. Natural disease trajectory and expectations were discussed.  I discussed the importance of continued conversation with family and their medical providers regarding overall plan of care and treatment options, ensuring decisions are within the context of the patients values and GOCs.  PLAN: Continue with gabapentin 200 mg twice daily Continue Cymbalta 30 mg daily MiraLAX daily for bowel regimen Extensive discussion with patient and daughter with recommendations for outpatient hospice.  They wish to continue further discussions amongst each other prior to making any final decisions however they  are in agreement with additional support from outpatient palliative with the understanding they may transition her care to hospice at any time but notify their medical team.  Education provided on the goals and philosophy of care of hospice and palliative care. Ongoing goals of care discussions and complex decision making. I will plan to see patient back in 3 weeks in collaboration with her  other oncology appointments.   Patient expressed understanding and was in agreement with this plan. She also understands that She can call the clinic at any time with any questions, concerns, or complaints.     Time Total: 45 min  Visit consisted of counseling and education dealing with the complex and emotionally intense issues of symptom management and palliative care in the setting of serious and potentially life-threatening illness.Greater than 50%  of this time was spent counseling and coordinating care related to the above assessment and plan.  Signed by: Alda Lea, AGPCNP-BC [place]

## 2021-09-23 NOTE — Progress Notes (Signed)
Per Dr. Ernestina Penna verbal order, refer pt to [facility] Team for home palliative care.  Pt is in agreement to go on home palliative care but does not agree to be on Hospice at this time per Dr. Burr Medico.  Epic faxed referral order, pt demographics, and Dr. Ernestina Penna last office note to [employer].  Epic fax confirmation received.  Pt currently sees Jobe Gibbon, NP with [facility] Team in the [facility].

## 2021-09-24 ENCOUNTER — Encounter: Payer: Self-pay | Admitting: General Practice

## 2021-09-24 NOTE — Progress Notes (Signed)
[facility] Spiritual Care Note  Phoned per follow-up plan for pastoral check-in after Palliative Care appointment; left voicemail requesting return call.   [place], [place], [facility] Pager [phone] Voicemail [phone]

## 2021-09-26 ENCOUNTER — Encounter: Payer: Self-pay | Admitting: General Practice

## 2021-09-26 ENCOUNTER — Telehealth: Payer: Self-pay

## 2021-09-26 NOTE — Telephone Encounter (Signed)
(  4:10 pm) PC SW talked with patient's granddaughter-Brandy regarding scheduling an appointment for patient. Brandy advised that SW needed to call patient directly. SW then tried to call patient on her home and cell number leaving a message on both, requesting a call back. SW also sent her a text message.

## 2021-09-26 NOTE — Progress Notes (Signed)
[place] Spiritual Care Note  Followed up with Ms Jeneen Rinks by phone. She reports that she is mulling over hospice as a possibility for additional support to support her quality of life.   One of Ms Barsky strengths is practicing self-care when she intermittently down: a meal, a nap, and listening to meaningful radio all help her recharge and redirect.  Ms Tappan values having a conversation partner and welcomes follow-up check-ins. We plan to speak by phone in the coming week or two.   [place], [place], [facility] Pager [phone] Voicemail [phone]

## 2021-09-29 ENCOUNTER — Other Ambulatory Visit: Payer: Self-pay

## 2021-09-29 ENCOUNTER — Telehealth: Payer: Self-pay

## 2021-09-29 NOTE — Telephone Encounter (Signed)
(  10:00 am) SW left a message for patient requesting a call back to schedule a visit with RN/SW team.

## 2021-09-30 ENCOUNTER — Telehealth: Payer: Self-pay

## 2021-09-30 NOTE — Telephone Encounter (Signed)
(  4:25 pm) PC SW scheduled a follow-up call to patient to schedule a palliative care visit. Patient is scheduled to see palliative care clinical RN/SW team on 10/07/21 @ 11 am.

## 2021-10-02 ENCOUNTER — Other Ambulatory Visit: Payer: Self-pay

## 2021-10-02 MED ORDER — DULOXETINE HCL 30 MG PO CPEP: 30.0000 mg | ORAL_CAPSULE | Freq: Every day | ORAL | 0 refills | Status: AC

## 2021-10-02 NOTE — Progress Notes (Signed)
Refill sent to pharmacy pick up date adjusted.

## 2021-10-06 ENCOUNTER — Ambulatory Visit: Payer: Medicare Other | Admitting: Podiatry

## 2021-10-07 ENCOUNTER — Other Ambulatory Visit: Payer: Medicare Other | Admitting: *Deleted

## 2021-10-07 ENCOUNTER — Other Ambulatory Visit: Payer: Self-pay

## 2021-10-07 ENCOUNTER — Other Ambulatory Visit: Payer: Medicare Other

## 2021-10-07 DIAGNOSIS — Z515 Encounter for palliative care: Secondary | ICD-10-CM

## 2021-10-07 NOTE — Progress Notes (Signed)
COMMUNITY PALLIATIVE CARE SW NOTE  PATIENT NAME: Donna May DOB: [birth year] MRN: [id number]  PRIMARY CARE PROVIDER: [name], MD  RESPONSIBLE PARTY:  Acct ID - Guarantor Home Phone Work Phone Relationship Acct Type  [id number] VALLERIE, [phone]  Self P/F     [address] [place] [address]   SOCIAL WORK ENCOUNTER  Nainika Newlun 10/07/21 SW and RN-M. Nadara Mustard completed a joint visit with patient at her home. Patient's daughter-Brandy was present with her. Patient ambulating slowly with her cane in the home. Patient report using a wheelchair when she goes out. When asked how she felt, patient reported "I have seen better days". She report having neuropathy in her hands and feet. She is taking medication for the neuropathy as needed. Patient has hard time opening medications due to neuropathy in her hands. She uses a pill box. Patient desires to do for herself and be more independent, but is unable to do to decreased energy. Patient report that she is forgetful and memory is not as good as was before. Patient observed repeating herself and looking to her daughter to clarify dates/times. Patient report that her appetite is fair. Her brother brings her breakfast or occasionally take her out to eat. Patient report that she sis sleeping more overall and she eats when she is not sleeping. Patient lives alone with daily contact. Patient report her size has changed where she was a size 14/16 to now she is a size 12. Patient has intermittent swelling in her legs. She has pain when she elevates her left leg and take it down.  Patient provided education regarding hospice services, the benefits and addressed some myths about hospice. Patient agreed to a hospice consult-RN to follow up.   DNR: Left in the home MOST FORM: No HOSPICE EDUCATION: Yes, provided and agreed to a consult  Katheren Puller, LCSW

## 2021-10-07 NOTE — Progress Notes (Signed)
[facility] COMMUNITY PALLIATIVE CARE RN NOTE  PATIENT NAME: [name] DOB: May 02, 1940 MRN: [id number]  PRIMARY CARE PROVIDER: [name], MD  RESPONSIBLE PARTY: [name] (daughter) Acct ID - Guarantor Home Phone Work Phone Relationship Acct Type  [id number] BETSABE, [phone]  Self P/F     [address], [place] [address]   RN/SW joint follow up visit completed with patient in her home. Her daughter Theadora Rama was also present during visit.  Pain: Reports neuropathic pain in her legs and feet induced by chemotherapy. She takes Gabapentin but not regularly. She is having difficulty opening her medications due to this. Now using pill box as of a week ago.   Cognitive: Forgetful and repetitive statements. Able to follow commands and make her needs known. Looks to daughter to help answer questions.  Cardiovascular: Has CHF. Edema noted to bilateral feet and arms. Tries to keep legs elevated, but reports pain when she takes her left leg down from elevated position. Currently on 40 mg of Lasix daily. Fatigues very easily.  Mobility: Progressive weakness. Difficulty walking. Ambulates with a cane at a very slow pace. Requiring more assistance with ADLs. Uses wheelchair when travelling outside of the home  Appetite: Intake is variable but says she "does not eat a lot." Used to wear size 14/16 now wears size 12. Clothes fitting more loosely. Brother takes her out to eat occasionally or brings her breakfast.  Constipation: Has issues with constipation at times. States last bowel movement was 3-4 days ago. Advised she will take Miralax today.   Goals of care: Code status is DNR, but she was unable to locate golden rod form in the home. LCSW left 2 signed copies in the home. Patient has a diagnosis of intrahepatic cholangiocarcinoma. She has tried several chemotherapy treatments but did not tolerate well so they were stopped. Recent PET scan showed cancer progression.  Patient states that she initially declined hospice when it was recommended by Dr. Burr Medico, but is now agreeable to a hospice evaluation. Communication sent to Dr. Burr Medico requesting hospice order. Return communication received giving hospice consult order and agrees to be the attending. This information was sent to our [facility] referral center.   CODE STATUS: DNR  ADVANCED DIRECTIVES: N MOST FORM: no PPS: 50%   Daryl Eastern, RN BSN

## 2021-10-08 ENCOUNTER — Encounter: Payer: Self-pay | Admitting: General Practice

## 2021-10-08 NOTE — Progress Notes (Signed)
[place] Spiritual Care Note  Attempted pastoral check-in by phone, leaving voicemail with encouragement to return call. Will try back if needed.   [place], [place], [facility] Pager [phone] Voicemail [phone]

## 2021-10-09 ENCOUNTER — Encounter: Payer: Self-pay | Admitting: Hematology

## 2021-10-10 DIAGNOSIS — K219 Gastro-esophageal reflux disease without esophagitis: Secondary | ICD-10-CM | POA: Diagnosis not present

## 2021-10-10 DIAGNOSIS — K449 Diaphragmatic hernia without obstruction or gangrene: Secondary | ICD-10-CM | POA: Diagnosis not present

## 2021-10-10 DIAGNOSIS — F32A Depression, unspecified: Secondary | ICD-10-CM | POA: Diagnosis not present

## 2021-10-10 DIAGNOSIS — I130 Hypertensive heart and chronic kidney disease with heart failure and stage 1 through stage 4 chronic kidney disease, or unspecified chronic kidney disease: Secondary | ICD-10-CM | POA: Diagnosis not present

## 2021-10-10 DIAGNOSIS — C221 Intrahepatic bile duct carcinoma: Secondary | ICD-10-CM | POA: Diagnosis not present

## 2021-10-10 DIAGNOSIS — N183 Chronic kidney disease, stage 3 unspecified: Secondary | ICD-10-CM | POA: Diagnosis not present

## 2021-10-10 DIAGNOSIS — I509 Heart failure, unspecified: Secondary | ICD-10-CM | POA: Diagnosis not present

## 2021-10-10 DIAGNOSIS — I701 Atherosclerosis of renal artery: Secondary | ICD-10-CM | POA: Diagnosis not present

## 2021-10-10 DIAGNOSIS — E1142 Type 2 diabetes mellitus with diabetic polyneuropathy: Secondary | ICD-10-CM | POA: Diagnosis not present

## 2021-10-10 DIAGNOSIS — D631 Anemia in chronic kidney disease: Secondary | ICD-10-CM | POA: Diagnosis not present

## 2021-10-10 DIAGNOSIS — E785 Hyperlipidemia, unspecified: Secondary | ICD-10-CM | POA: Diagnosis not present

## 2021-10-11 DIAGNOSIS — E1142 Type 2 diabetes mellitus with diabetic polyneuropathy: Secondary | ICD-10-CM | POA: Diagnosis not present

## 2021-10-11 DIAGNOSIS — C221 Intrahepatic bile duct carcinoma: Secondary | ICD-10-CM | POA: Diagnosis not present

## 2021-10-11 DIAGNOSIS — I130 Hypertensive heart and chronic kidney disease with heart failure and stage 1 through stage 4 chronic kidney disease, or unspecified chronic kidney disease: Secondary | ICD-10-CM | POA: Diagnosis not present

## 2021-10-11 DIAGNOSIS — N183 Chronic kidney disease, stage 3 unspecified: Secondary | ICD-10-CM | POA: Diagnosis not present

## 2021-10-11 DIAGNOSIS — D631 Anemia in chronic kidney disease: Secondary | ICD-10-CM | POA: Diagnosis not present

## 2021-10-11 DIAGNOSIS — I509 Heart failure, unspecified: Secondary | ICD-10-CM | POA: Diagnosis not present

## 2021-10-13 DIAGNOSIS — N183 Chronic kidney disease, stage 3 unspecified: Secondary | ICD-10-CM | POA: Diagnosis not present

## 2021-10-13 DIAGNOSIS — D631 Anemia in chronic kidney disease: Secondary | ICD-10-CM | POA: Diagnosis not present

## 2021-10-13 DIAGNOSIS — I509 Heart failure, unspecified: Secondary | ICD-10-CM | POA: Diagnosis not present

## 2021-10-13 DIAGNOSIS — E1142 Type 2 diabetes mellitus with diabetic polyneuropathy: Secondary | ICD-10-CM | POA: Diagnosis not present

## 2021-10-13 DIAGNOSIS — I130 Hypertensive heart and chronic kidney disease with heart failure and stage 1 through stage 4 chronic kidney disease, or unspecified chronic kidney disease: Secondary | ICD-10-CM | POA: Diagnosis not present

## 2021-10-13 DIAGNOSIS — C221 Intrahepatic bile duct carcinoma: Secondary | ICD-10-CM | POA: Diagnosis not present

## 2021-10-14 DIAGNOSIS — K449 Diaphragmatic hernia without obstruction or gangrene: Secondary | ICD-10-CM | POA: Diagnosis not present

## 2021-10-14 DIAGNOSIS — I701 Atherosclerosis of renal artery: Secondary | ICD-10-CM | POA: Diagnosis not present

## 2021-10-14 DIAGNOSIS — I130 Hypertensive heart and chronic kidney disease with heart failure and stage 1 through stage 4 chronic kidney disease, or unspecified chronic kidney disease: Secondary | ICD-10-CM | POA: Diagnosis not present

## 2021-10-14 DIAGNOSIS — N183 Chronic kidney disease, stage 3 unspecified: Secondary | ICD-10-CM | POA: Diagnosis not present

## 2021-10-14 DIAGNOSIS — K219 Gastro-esophageal reflux disease without esophagitis: Secondary | ICD-10-CM | POA: Diagnosis not present

## 2021-10-14 DIAGNOSIS — C221 Intrahepatic bile duct carcinoma: Secondary | ICD-10-CM | POA: Diagnosis not present

## 2021-10-14 DIAGNOSIS — I509 Heart failure, unspecified: Secondary | ICD-10-CM | POA: Diagnosis not present

## 2021-10-14 DIAGNOSIS — E1142 Type 2 diabetes mellitus with diabetic polyneuropathy: Secondary | ICD-10-CM | POA: Diagnosis not present

## 2021-10-14 DIAGNOSIS — F32A Depression, unspecified: Secondary | ICD-10-CM | POA: Diagnosis not present

## 2021-10-14 DIAGNOSIS — E785 Hyperlipidemia, unspecified: Secondary | ICD-10-CM | POA: Diagnosis not present

## 2021-10-14 DIAGNOSIS — D631 Anemia in chronic kidney disease: Secondary | ICD-10-CM | POA: Diagnosis not present

## 2021-10-15 DIAGNOSIS — I130 Hypertensive heart and chronic kidney disease with heart failure and stage 1 through stage 4 chronic kidney disease, or unspecified chronic kidney disease: Secondary | ICD-10-CM | POA: Diagnosis not present

## 2021-10-15 DIAGNOSIS — N183 Chronic kidney disease, stage 3 unspecified: Secondary | ICD-10-CM | POA: Diagnosis not present

## 2021-10-15 DIAGNOSIS — E1142 Type 2 diabetes mellitus with diabetic polyneuropathy: Secondary | ICD-10-CM | POA: Diagnosis not present

## 2021-10-15 DIAGNOSIS — C221 Intrahepatic bile duct carcinoma: Secondary | ICD-10-CM | POA: Diagnosis not present

## 2021-10-15 DIAGNOSIS — D631 Anemia in chronic kidney disease: Secondary | ICD-10-CM | POA: Diagnosis not present

## 2021-10-15 DIAGNOSIS — I509 Heart failure, unspecified: Secondary | ICD-10-CM | POA: Diagnosis not present

## 2021-10-16 DIAGNOSIS — N183 Chronic kidney disease, stage 3 unspecified: Secondary | ICD-10-CM | POA: Diagnosis not present

## 2021-10-16 DIAGNOSIS — I130 Hypertensive heart and chronic kidney disease with heart failure and stage 1 through stage 4 chronic kidney disease, or unspecified chronic kidney disease: Secondary | ICD-10-CM | POA: Diagnosis not present

## 2021-10-16 DIAGNOSIS — E1142 Type 2 diabetes mellitus with diabetic polyneuropathy: Secondary | ICD-10-CM | POA: Diagnosis not present

## 2021-10-16 DIAGNOSIS — C221 Intrahepatic bile duct carcinoma: Secondary | ICD-10-CM | POA: Diagnosis not present

## 2021-10-16 DIAGNOSIS — I509 Heart failure, unspecified: Secondary | ICD-10-CM | POA: Diagnosis not present

## 2021-10-16 DIAGNOSIS — D631 Anemia in chronic kidney disease: Secondary | ICD-10-CM | POA: Diagnosis not present

## 2021-10-17 DIAGNOSIS — D631 Anemia in chronic kidney disease: Secondary | ICD-10-CM | POA: Diagnosis not present

## 2021-10-17 DIAGNOSIS — N183 Chronic kidney disease, stage 3 unspecified: Secondary | ICD-10-CM | POA: Diagnosis not present

## 2021-10-17 DIAGNOSIS — E1142 Type 2 diabetes mellitus with diabetic polyneuropathy: Secondary | ICD-10-CM | POA: Diagnosis not present

## 2021-10-17 DIAGNOSIS — I130 Hypertensive heart and chronic kidney disease with heart failure and stage 1 through stage 4 chronic kidney disease, or unspecified chronic kidney disease: Secondary | ICD-10-CM | POA: Diagnosis not present

## 2021-10-17 DIAGNOSIS — I509 Heart failure, unspecified: Secondary | ICD-10-CM | POA: Diagnosis not present

## 2021-10-17 DIAGNOSIS — C221 Intrahepatic bile duct carcinoma: Secondary | ICD-10-CM | POA: Diagnosis not present

## 2021-10-18 DIAGNOSIS — N183 Chronic kidney disease, stage 3 unspecified: Secondary | ICD-10-CM | POA: Diagnosis not present

## 2021-10-18 DIAGNOSIS — I509 Heart failure, unspecified: Secondary | ICD-10-CM | POA: Diagnosis not present

## 2021-10-18 DIAGNOSIS — D631 Anemia in chronic kidney disease: Secondary | ICD-10-CM | POA: Diagnosis not present

## 2021-10-18 DIAGNOSIS — E1142 Type 2 diabetes mellitus with diabetic polyneuropathy: Secondary | ICD-10-CM | POA: Diagnosis not present

## 2021-10-18 DIAGNOSIS — C221 Intrahepatic bile duct carcinoma: Secondary | ICD-10-CM | POA: Diagnosis not present

## 2021-10-18 DIAGNOSIS — I130 Hypertensive heart and chronic kidney disease with heart failure and stage 1 through stage 4 chronic kidney disease, or unspecified chronic kidney disease: Secondary | ICD-10-CM | POA: Diagnosis not present

## 2021-10-19 DIAGNOSIS — N183 Chronic kidney disease, stage 3 unspecified: Secondary | ICD-10-CM | POA: Diagnosis not present

## 2021-10-19 DIAGNOSIS — I509 Heart failure, unspecified: Secondary | ICD-10-CM | POA: Diagnosis not present

## 2021-10-19 DIAGNOSIS — D631 Anemia in chronic kidney disease: Secondary | ICD-10-CM | POA: Diagnosis not present

## 2021-10-19 DIAGNOSIS — C221 Intrahepatic bile duct carcinoma: Secondary | ICD-10-CM | POA: Diagnosis not present

## 2021-10-19 DIAGNOSIS — E1142 Type 2 diabetes mellitus with diabetic polyneuropathy: Secondary | ICD-10-CM | POA: Diagnosis not present

## 2021-10-19 DIAGNOSIS — I130 Hypertensive heart and chronic kidney disease with heart failure and stage 1 through stage 4 chronic kidney disease, or unspecified chronic kidney disease: Secondary | ICD-10-CM | POA: Diagnosis not present

## 2021-10-20 ENCOUNTER — Encounter: Payer: Self-pay | Admitting: General Practice

## 2021-10-20 ENCOUNTER — Ambulatory Visit: Payer: Medicare Other | Admitting: Family Medicine

## 2021-10-20 DIAGNOSIS — N183 Chronic kidney disease, stage 3 unspecified: Secondary | ICD-10-CM | POA: Diagnosis not present

## 2021-10-20 DIAGNOSIS — D631 Anemia in chronic kidney disease: Secondary | ICD-10-CM | POA: Diagnosis not present

## 2021-10-20 DIAGNOSIS — I130 Hypertensive heart and chronic kidney disease with heart failure and stage 1 through stage 4 chronic kidney disease, or unspecified chronic kidney disease: Secondary | ICD-10-CM | POA: Diagnosis not present

## 2021-10-20 DIAGNOSIS — E1142 Type 2 diabetes mellitus with diabetic polyneuropathy: Secondary | ICD-10-CM | POA: Diagnosis not present

## 2021-10-20 DIAGNOSIS — I509 Heart failure, unspecified: Secondary | ICD-10-CM | POA: Diagnosis not present

## 2021-10-20 DIAGNOSIS — C221 Intrahepatic bile duct carcinoma: Secondary | ICD-10-CM | POA: Diagnosis not present

## 2021-10-20 NOTE — Progress Notes (Signed)
[facility] Spiritual Care Note  Followed up by phone. Spoke with Ms Genter daughter-in-law, who shared that Ms Sherpa was admitted to [facility] yesterday. Shared my ongoing prayer support for her and her family during this time.   [place], [place], [facility] Pager [phone] Voicemail [phone]

## 2021-10-21 DIAGNOSIS — C221 Intrahepatic bile duct carcinoma: Secondary | ICD-10-CM | POA: Diagnosis not present

## 2021-10-21 DIAGNOSIS — D631 Anemia in chronic kidney disease: Secondary | ICD-10-CM | POA: Diagnosis not present

## 2021-10-21 DIAGNOSIS — N183 Chronic kidney disease, stage 3 unspecified: Secondary | ICD-10-CM | POA: Diagnosis not present

## 2021-10-21 DIAGNOSIS — E1142 Type 2 diabetes mellitus with diabetic polyneuropathy: Secondary | ICD-10-CM | POA: Diagnosis not present

## 2021-10-21 DIAGNOSIS — I509 Heart failure, unspecified: Secondary | ICD-10-CM | POA: Diagnosis not present

## 2021-10-21 DIAGNOSIS — I130 Hypertensive heart and chronic kidney disease with heart failure and stage 1 through stage 4 chronic kidney disease, or unspecified chronic kidney disease: Secondary | ICD-10-CM | POA: Diagnosis not present

## 2021-10-22 DIAGNOSIS — E1142 Type 2 diabetes mellitus with diabetic polyneuropathy: Secondary | ICD-10-CM | POA: Diagnosis not present

## 2021-10-22 DIAGNOSIS — I509 Heart failure, unspecified: Secondary | ICD-10-CM | POA: Diagnosis not present

## 2021-10-22 DIAGNOSIS — D631 Anemia in chronic kidney disease: Secondary | ICD-10-CM | POA: Diagnosis not present

## 2021-10-22 DIAGNOSIS — N183 Chronic kidney disease, stage 3 unspecified: Secondary | ICD-10-CM | POA: Diagnosis not present

## 2021-10-22 DIAGNOSIS — C221 Intrahepatic bile duct carcinoma: Secondary | ICD-10-CM | POA: Diagnosis not present

## 2021-10-22 DIAGNOSIS — I130 Hypertensive heart and chronic kidney disease with heart failure and stage 1 through stage 4 chronic kidney disease, or unspecified chronic kidney disease: Secondary | ICD-10-CM | POA: Diagnosis not present

## 2021-10-23 ENCOUNTER — Other Ambulatory Visit: Payer: Self-pay

## 2021-10-23 DIAGNOSIS — E1142 Type 2 diabetes mellitus with diabetic polyneuropathy: Secondary | ICD-10-CM | POA: Diagnosis not present

## 2021-10-23 DIAGNOSIS — C221 Intrahepatic bile duct carcinoma: Secondary | ICD-10-CM | POA: Diagnosis not present

## 2021-10-23 DIAGNOSIS — I130 Hypertensive heart and chronic kidney disease with heart failure and stage 1 through stage 4 chronic kidney disease, or unspecified chronic kidney disease: Secondary | ICD-10-CM | POA: Diagnosis not present

## 2021-10-23 DIAGNOSIS — D631 Anemia in chronic kidney disease: Secondary | ICD-10-CM | POA: Diagnosis not present

## 2021-10-23 DIAGNOSIS — N183 Chronic kidney disease, stage 3 unspecified: Secondary | ICD-10-CM | POA: Diagnosis not present

## 2021-10-23 DIAGNOSIS — I509 Heart failure, unspecified: Secondary | ICD-10-CM | POA: Diagnosis not present

## 2021-11-11 ENCOUNTER — Other Ambulatory Visit (HOSPITAL_COMMUNITY): Payer: Self-pay

## 2021-11-14 DEATH — deceased

## 2022-05-27 NOTE — Telephone Encounter (Signed)
Error

## 2023-02-04 NOTE — Telephone Encounter (Signed)
Telephone call
